# Patient Record
Sex: Female | Born: 1965 | Race: Black or African American | Hispanic: No | Marital: Single | Smoking: Former smoker
Health system: Southern US, Community
[De-identification: ages and names within clinical notes are randomized; demographics above are authoritative.]

## PROBLEM LIST (undated history)

## (undated) DIAGNOSIS — I509 Heart failure, unspecified: Secondary | ICD-10-CM

## (undated) DIAGNOSIS — F319 Bipolar disorder, unspecified: Secondary | ICD-10-CM

## (undated) DIAGNOSIS — D649 Anemia, unspecified: Secondary | ICD-10-CM

## (undated) DIAGNOSIS — H409 Unspecified glaucoma: Secondary | ICD-10-CM

## (undated) DIAGNOSIS — I10 Essential (primary) hypertension: Secondary | ICD-10-CM

## (undated) DIAGNOSIS — R011 Cardiac murmur, unspecified: Secondary | ICD-10-CM

## (undated) DIAGNOSIS — F1910 Other psychoactive substance abuse, uncomplicated: Secondary | ICD-10-CM

## (undated) DIAGNOSIS — T7840XA Allergy, unspecified, initial encounter: Secondary | ICD-10-CM

## (undated) DIAGNOSIS — K259 Gastric ulcer, unspecified as acute or chronic, without hemorrhage or perforation: Secondary | ICD-10-CM

## (undated) DIAGNOSIS — E7800 Pure hypercholesterolemia, unspecified: Secondary | ICD-10-CM

## (undated) DIAGNOSIS — F329 Major depressive disorder, single episode, unspecified: Secondary | ICD-10-CM

## (undated) DIAGNOSIS — M109 Gout, unspecified: Secondary | ICD-10-CM

## (undated) DIAGNOSIS — R51 Headache: Secondary | ICD-10-CM

## (undated) DIAGNOSIS — G4730 Sleep apnea, unspecified: Secondary | ICD-10-CM

## (undated) DIAGNOSIS — M26609 Unspecified temporomandibular joint disorder, unspecified side: Secondary | ICD-10-CM

## (undated) DIAGNOSIS — F32A Depression, unspecified: Secondary | ICD-10-CM

## (undated) DIAGNOSIS — K219 Gastro-esophageal reflux disease without esophagitis: Secondary | ICD-10-CM

## (undated) DIAGNOSIS — F419 Anxiety disorder, unspecified: Secondary | ICD-10-CM

## (undated) DIAGNOSIS — N6099 Unspecified benign mammary dysplasia of unspecified breast: Secondary | ICD-10-CM

## (undated) DIAGNOSIS — I639 Cerebral infarction, unspecified: Secondary | ICD-10-CM

## (undated) DIAGNOSIS — M1990 Unspecified osteoarthritis, unspecified site: Secondary | ICD-10-CM

## (undated) HISTORY — DX: Other psychoactive substance abuse, uncomplicated: F19.10

## (undated) HISTORY — DX: Cardiac murmur, unspecified: R01.1

## (undated) HISTORY — DX: Unspecified glaucoma: H40.9

## (undated) HISTORY — DX: Allergy, unspecified, initial encounter: T78.40XA

## (undated) HISTORY — PX: BREAST EXCISIONAL BIOPSY: SUR124

## (undated) HISTORY — DX: Anemia, unspecified: D64.9

---

## 1997-12-07 ENCOUNTER — Emergency Department (HOSPITAL_COMMUNITY): Admission: EM | Admit: 1997-12-07 | Discharge: 1997-12-07 | Payer: Self-pay

## 1999-06-14 ENCOUNTER — Other Ambulatory Visit: Admission: RE | Admit: 1999-06-14 | Discharge: 1999-06-14 | Payer: Self-pay | Admitting: Family Medicine

## 1999-07-07 ENCOUNTER — Emergency Department (HOSPITAL_COMMUNITY): Admission: EM | Admit: 1999-07-07 | Discharge: 1999-07-07 | Payer: Self-pay | Admitting: Emergency Medicine

## 1999-07-07 ENCOUNTER — Encounter: Payer: Self-pay | Admitting: Emergency Medicine

## 2000-03-10 ENCOUNTER — Emergency Department (HOSPITAL_COMMUNITY): Admission: EM | Admit: 2000-03-10 | Discharge: 2000-03-10 | Payer: Self-pay | Admitting: Emergency Medicine

## 2000-03-12 ENCOUNTER — Emergency Department (HOSPITAL_COMMUNITY): Admission: EM | Admit: 2000-03-12 | Discharge: 2000-03-12 | Payer: Self-pay | Admitting: Emergency Medicine

## 2002-02-09 ENCOUNTER — Encounter: Payer: Self-pay | Admitting: Family Medicine

## 2002-02-09 ENCOUNTER — Ambulatory Visit (HOSPITAL_COMMUNITY): Admission: RE | Admit: 2002-02-09 | Discharge: 2002-02-09 | Payer: Self-pay | Admitting: Family Medicine

## 2005-10-20 ENCOUNTER — Emergency Department (HOSPITAL_COMMUNITY): Admission: EM | Admit: 2005-10-20 | Discharge: 2005-10-21 | Payer: Self-pay | Admitting: Emergency Medicine

## 2006-08-28 ENCOUNTER — Emergency Department (HOSPITAL_COMMUNITY): Admission: EM | Admit: 2006-08-28 | Discharge: 2006-08-28 | Payer: Self-pay | Admitting: Emergency Medicine

## 2006-12-14 ENCOUNTER — Ambulatory Visit: Payer: Self-pay | Admitting: Family Medicine

## 2006-12-21 ENCOUNTER — Emergency Department (HOSPITAL_COMMUNITY): Admission: EM | Admit: 2006-12-21 | Discharge: 2006-12-21 | Payer: Self-pay | Admitting: Emergency Medicine

## 2007-01-05 ENCOUNTER — Ambulatory Visit: Payer: Self-pay | Admitting: Family Medicine

## 2007-01-05 ENCOUNTER — Encounter (INDEPENDENT_AMBULATORY_CARE_PROVIDER_SITE_OTHER): Payer: Self-pay | Admitting: Family Medicine

## 2007-01-05 DIAGNOSIS — R8789 Other abnormal findings in specimens from female genital organs: Secondary | ICD-10-CM | POA: Insufficient documentation

## 2007-01-05 LAB — CONVERTED CEMR LAB
Chlamydia, DNA Probe: NEGATIVE
GC Probe Amp, Genital: NEGATIVE
Pap Smear: ABNORMAL

## 2007-01-12 ENCOUNTER — Ambulatory Visit: Payer: Self-pay | Admitting: Family Medicine

## 2007-01-15 ENCOUNTER — Ambulatory Visit: Payer: Self-pay | Admitting: Family Medicine

## 2007-02-08 ENCOUNTER — Telehealth (INDEPENDENT_AMBULATORY_CARE_PROVIDER_SITE_OTHER): Payer: Self-pay | Admitting: *Deleted

## 2007-02-19 ENCOUNTER — Ambulatory Visit: Payer: Self-pay | Admitting: *Deleted

## 2007-02-22 ENCOUNTER — Encounter (INDEPENDENT_AMBULATORY_CARE_PROVIDER_SITE_OTHER): Payer: Self-pay | Admitting: Family Medicine

## 2007-02-22 DIAGNOSIS — D649 Anemia, unspecified: Secondary | ICD-10-CM | POA: Insufficient documentation

## 2007-02-26 DIAGNOSIS — I10 Essential (primary) hypertension: Secondary | ICD-10-CM | POA: Insufficient documentation

## 2007-03-04 ENCOUNTER — Encounter (INDEPENDENT_AMBULATORY_CARE_PROVIDER_SITE_OTHER): Payer: Self-pay | Admitting: *Deleted

## 2007-03-10 ENCOUNTER — Ambulatory Visit: Payer: Self-pay | Admitting: Family Medicine

## 2007-03-10 ENCOUNTER — Telehealth (INDEPENDENT_AMBULATORY_CARE_PROVIDER_SITE_OTHER): Payer: Self-pay | Admitting: *Deleted

## 2007-03-10 DIAGNOSIS — M545 Low back pain: Secondary | ICD-10-CM

## 2007-04-01 ENCOUNTER — Encounter (INDEPENDENT_AMBULATORY_CARE_PROVIDER_SITE_OTHER): Payer: Self-pay | Admitting: Family Medicine

## 2007-04-01 ENCOUNTER — Encounter: Admission: RE | Admit: 2007-04-01 | Discharge: 2007-06-30 | Payer: Self-pay | Admitting: Family Medicine

## 2007-08-03 ENCOUNTER — Emergency Department (HOSPITAL_COMMUNITY): Admission: EM | Admit: 2007-08-03 | Discharge: 2007-08-03 | Payer: Self-pay | Admitting: Emergency Medicine

## 2007-08-12 ENCOUNTER — Encounter (INDEPENDENT_AMBULATORY_CARE_PROVIDER_SITE_OTHER): Payer: Self-pay | Admitting: Family Medicine

## 2007-08-26 ENCOUNTER — Encounter (INDEPENDENT_AMBULATORY_CARE_PROVIDER_SITE_OTHER): Payer: Self-pay | Admitting: Family Medicine

## 2007-09-10 ENCOUNTER — Encounter (INDEPENDENT_AMBULATORY_CARE_PROVIDER_SITE_OTHER): Payer: Self-pay | Admitting: Family Medicine

## 2007-09-29 ENCOUNTER — Telehealth (INDEPENDENT_AMBULATORY_CARE_PROVIDER_SITE_OTHER): Payer: Self-pay | Admitting: *Deleted

## 2007-10-11 ENCOUNTER — Ambulatory Visit: Payer: Self-pay | Admitting: Family Medicine

## 2007-10-11 DIAGNOSIS — K219 Gastro-esophageal reflux disease without esophagitis: Secondary | ICD-10-CM | POA: Insufficient documentation

## 2007-10-18 ENCOUNTER — Encounter (INDEPENDENT_AMBULATORY_CARE_PROVIDER_SITE_OTHER): Payer: Self-pay | Admitting: Family Medicine

## 2007-12-30 ENCOUNTER — Emergency Department (HOSPITAL_COMMUNITY): Admission: EM | Admit: 2007-12-30 | Discharge: 2007-12-30 | Payer: Self-pay | Admitting: Emergency Medicine

## 2008-02-29 ENCOUNTER — Ambulatory Visit: Payer: Self-pay | Admitting: Family Medicine

## 2008-02-29 DIAGNOSIS — K5900 Constipation, unspecified: Secondary | ICD-10-CM | POA: Insufficient documentation

## 2008-04-04 ENCOUNTER — Encounter (INDEPENDENT_AMBULATORY_CARE_PROVIDER_SITE_OTHER): Payer: Self-pay | Admitting: *Deleted

## 2008-05-26 ENCOUNTER — Emergency Department (HOSPITAL_COMMUNITY): Admission: EM | Admit: 2008-05-26 | Discharge: 2008-05-26 | Payer: Self-pay | Admitting: Emergency Medicine

## 2008-05-28 ENCOUNTER — Emergency Department (HOSPITAL_COMMUNITY): Admission: EM | Admit: 2008-05-28 | Discharge: 2008-05-28 | Payer: Self-pay | Admitting: Emergency Medicine

## 2008-06-14 ENCOUNTER — Telehealth (INDEPENDENT_AMBULATORY_CARE_PROVIDER_SITE_OTHER): Payer: Self-pay | Admitting: *Deleted

## 2008-06-15 ENCOUNTER — Ambulatory Visit: Payer: Self-pay | Admitting: Nurse Practitioner

## 2008-06-15 DIAGNOSIS — H6690 Otitis media, unspecified, unspecified ear: Secondary | ICD-10-CM | POA: Insufficient documentation

## 2008-06-15 LAB — CONVERTED CEMR LAB
Alkaline Phosphatase: 54 units/L (ref 39–117)
Basophils Absolute: 0 10*3/uL (ref 0.0–0.1)
Eosinophils Absolute: 0.1 10*3/uL (ref 0.0–0.7)
Eosinophils Relative: 2 % (ref 0–5)
Glucose, Bld: 98 mg/dL (ref 70–99)
HCT: 41.3 % (ref 36.0–46.0)
Lymphs Abs: 1.7 10*3/uL (ref 0.7–4.0)
MCHC: 31 g/dL (ref 30.0–36.0)
MCV: 93.7 fL (ref 78.0–100.0)
Platelets: 293 10*3/uL (ref 150–400)
RDW: 12.7 % (ref 11.5–15.5)
Sodium: 140 meq/L (ref 135–145)
Total Bilirubin: 0.3 mg/dL (ref 0.3–1.2)
Total Protein: 8.3 g/dL (ref 6.0–8.3)

## 2008-06-19 ENCOUNTER — Encounter (INDEPENDENT_AMBULATORY_CARE_PROVIDER_SITE_OTHER): Payer: Self-pay | Admitting: Nurse Practitioner

## 2008-07-11 ENCOUNTER — Ambulatory Visit: Payer: Self-pay | Admitting: Family Medicine

## 2008-07-11 ENCOUNTER — Encounter (INDEPENDENT_AMBULATORY_CARE_PROVIDER_SITE_OTHER): Payer: Self-pay | Admitting: Family Medicine

## 2008-07-11 DIAGNOSIS — R1909 Other intra-abdominal and pelvic swelling, mass and lump: Secondary | ICD-10-CM | POA: Insufficient documentation

## 2008-07-11 LAB — CONVERTED CEMR LAB
Blood in Urine, dipstick: NEGATIVE
Chlamydia, DNA Probe: NEGATIVE
GC Probe Amp, Genital: NEGATIVE
Glucose, Urine, Semiquant: NEGATIVE
WBC Urine, dipstick: NEGATIVE

## 2008-07-12 ENCOUNTER — Ambulatory Visit (HOSPITAL_COMMUNITY): Admission: RE | Admit: 2008-07-12 | Discharge: 2008-07-12 | Payer: Self-pay | Admitting: Family Medicine

## 2008-07-17 ENCOUNTER — Ambulatory Visit (HOSPITAL_COMMUNITY): Admission: RE | Admit: 2008-07-17 | Discharge: 2008-07-17 | Payer: Self-pay | Admitting: Family Medicine

## 2008-07-18 ENCOUNTER — Encounter (INDEPENDENT_AMBULATORY_CARE_PROVIDER_SITE_OTHER): Payer: Self-pay | Admitting: Family Medicine

## 2008-07-20 ENCOUNTER — Emergency Department (HOSPITAL_COMMUNITY): Admission: EM | Admit: 2008-07-20 | Discharge: 2008-07-20 | Payer: Self-pay | Admitting: Emergency Medicine

## 2008-07-26 ENCOUNTER — Telehealth (INDEPENDENT_AMBULATORY_CARE_PROVIDER_SITE_OTHER): Payer: Self-pay | Admitting: Family Medicine

## 2008-08-10 ENCOUNTER — Telehealth (INDEPENDENT_AMBULATORY_CARE_PROVIDER_SITE_OTHER): Payer: Self-pay | Admitting: *Deleted

## 2008-08-21 ENCOUNTER — Ambulatory Visit: Payer: Self-pay | Admitting: Family Medicine

## 2008-08-21 LAB — CONVERTED CEMR LAB: TSH: 0.716 microintl units/mL (ref 0.350–4.500)

## 2008-09-19 ENCOUNTER — Telehealth (INDEPENDENT_AMBULATORY_CARE_PROVIDER_SITE_OTHER): Payer: Self-pay | Admitting: Family Medicine

## 2008-09-26 ENCOUNTER — Emergency Department (HOSPITAL_COMMUNITY): Admission: EM | Admit: 2008-09-26 | Discharge: 2008-09-26 | Payer: Self-pay | Admitting: Emergency Medicine

## 2008-10-27 ENCOUNTER — Emergency Department (HOSPITAL_COMMUNITY): Admission: EM | Admit: 2008-10-27 | Discharge: 2008-10-27 | Payer: Self-pay | Admitting: Emergency Medicine

## 2008-10-30 ENCOUNTER — Inpatient Hospital Stay (HOSPITAL_COMMUNITY): Admission: EM | Admit: 2008-10-30 | Discharge: 2008-10-31 | Payer: Self-pay | Admitting: Emergency Medicine

## 2008-10-31 ENCOUNTER — Encounter (INDEPENDENT_AMBULATORY_CARE_PROVIDER_SITE_OTHER): Payer: Self-pay | Admitting: Family Medicine

## 2008-11-10 ENCOUNTER — Ambulatory Visit: Payer: Self-pay | Admitting: Family Medicine

## 2008-11-22 ENCOUNTER — Ambulatory Visit: Payer: Self-pay | Admitting: Family Medicine

## 2008-11-22 DIAGNOSIS — H9209 Otalgia, unspecified ear: Secondary | ICD-10-CM | POA: Insufficient documentation

## 2008-11-22 DIAGNOSIS — R42 Dizziness and giddiness: Secondary | ICD-10-CM

## 2008-11-22 DIAGNOSIS — M25469 Effusion, unspecified knee: Secondary | ICD-10-CM | POA: Insufficient documentation

## 2008-11-23 ENCOUNTER — Encounter (INDEPENDENT_AMBULATORY_CARE_PROVIDER_SITE_OTHER): Payer: Self-pay | Admitting: Family Medicine

## 2008-11-23 ENCOUNTER — Ambulatory Visit: Payer: Self-pay | Admitting: Internal Medicine

## 2008-11-27 ENCOUNTER — Encounter (INDEPENDENT_AMBULATORY_CARE_PROVIDER_SITE_OTHER): Payer: Self-pay | Admitting: Family Medicine

## 2008-12-16 ENCOUNTER — Emergency Department (HOSPITAL_COMMUNITY): Admission: EM | Admit: 2008-12-16 | Discharge: 2008-12-16 | Payer: Self-pay | Admitting: Emergency Medicine

## 2008-12-20 ENCOUNTER — Encounter (INDEPENDENT_AMBULATORY_CARE_PROVIDER_SITE_OTHER): Payer: Self-pay | Admitting: Internal Medicine

## 2008-12-28 ENCOUNTER — Ambulatory Visit: Payer: Self-pay | Admitting: Nurse Practitioner

## 2009-01-19 ENCOUNTER — Ambulatory Visit: Payer: Self-pay | Admitting: Nurse Practitioner

## 2009-01-19 ENCOUNTER — Encounter: Payer: Self-pay | Admitting: Physician Assistant

## 2009-01-19 ENCOUNTER — Encounter (INDEPENDENT_AMBULATORY_CARE_PROVIDER_SITE_OTHER): Payer: Self-pay | Admitting: Nurse Practitioner

## 2009-01-19 LAB — CONVERTED CEMR LAB
Nitrite: NEGATIVE
Specific Gravity, Urine: 1.02

## 2009-01-26 DIAGNOSIS — A5901 Trichomonal vulvovaginitis: Secondary | ICD-10-CM | POA: Insufficient documentation

## 2009-01-31 ENCOUNTER — Ambulatory Visit (HOSPITAL_COMMUNITY): Admission: RE | Admit: 2009-01-31 | Discharge: 2009-01-31 | Payer: Self-pay | Admitting: Gastroenterology

## 2009-01-31 ENCOUNTER — Encounter: Payer: Self-pay | Admitting: Physician Assistant

## 2009-02-26 ENCOUNTER — Ambulatory Visit: Payer: Self-pay | Admitting: Physician Assistant

## 2009-02-26 ENCOUNTER — Telehealth: Payer: Self-pay | Admitting: Physician Assistant

## 2009-02-26 DIAGNOSIS — R82998 Other abnormal findings in urine: Secondary | ICD-10-CM

## 2009-02-26 DIAGNOSIS — R079 Chest pain, unspecified: Secondary | ICD-10-CM

## 2009-02-26 LAB — CONVERTED CEMR LAB
Blood in Urine, dipstick: NEGATIVE
Nitrite: NEGATIVE
Protein, U semiquant: 30

## 2009-03-07 ENCOUNTER — Ambulatory Visit (HOSPITAL_COMMUNITY): Admission: RE | Admit: 2009-03-07 | Discharge: 2009-03-07 | Payer: Self-pay | Admitting: Physician Assistant

## 2009-03-12 ENCOUNTER — Encounter: Payer: Self-pay | Admitting: Physician Assistant

## 2009-03-14 ENCOUNTER — Telehealth: Payer: Self-pay | Admitting: Physician Assistant

## 2009-03-15 ENCOUNTER — Ambulatory Visit (HOSPITAL_COMMUNITY): Admission: RE | Admit: 2009-03-15 | Discharge: 2009-03-15 | Payer: Self-pay | Admitting: Internal Medicine

## 2009-03-16 ENCOUNTER — Encounter: Payer: Self-pay | Admitting: Physician Assistant

## 2009-03-28 ENCOUNTER — Ambulatory Visit: Payer: Self-pay | Admitting: Physician Assistant

## 2009-03-28 DIAGNOSIS — R0602 Shortness of breath: Secondary | ICD-10-CM | POA: Insufficient documentation

## 2009-03-29 ENCOUNTER — Encounter: Payer: Self-pay | Admitting: Physician Assistant

## 2009-04-02 DIAGNOSIS — A048 Other specified bacterial intestinal infections: Secondary | ICD-10-CM | POA: Insufficient documentation

## 2009-04-02 LAB — CONVERTED CEMR LAB
Albumin: 4.8 g/dL (ref 3.5–5.2)
Alkaline Phosphatase: 67 units/L (ref 39–117)
BUN: 12 mg/dL (ref 6–23)
Basophils Absolute: 0 10*3/uL (ref 0.0–0.1)
Basophils Relative: 0 % (ref 0–1)
CO2: 19 meq/L (ref 19–32)
Eosinophils Relative: 1 % (ref 0–5)
Glucose, Bld: 91 mg/dL (ref 70–99)
HCT: 38.4 % (ref 36.0–46.0)
Helicobacter pylori, IgM: 1.65 — ABNORMAL HIGH (ref <0.89)
Hemoglobin: 12.4 g/dL (ref 12.0–15.0)
Lymphocytes Relative: 24 % (ref 12–46)
MCHC: 32.3 g/dL (ref 30.0–36.0)
Monocytes Absolute: 0.3 10*3/uL (ref 0.1–1.0)
Monocytes Relative: 6 % (ref 3–12)
Potassium: 3.5 meq/L (ref 3.5–5.3)
RBC: 4.16 M/uL (ref 3.87–5.11)
RDW: 13.4 % (ref 11.5–15.5)
Total Protein: 8 g/dL (ref 6.0–8.3)

## 2009-04-04 ENCOUNTER — Encounter: Payer: Self-pay | Admitting: Physician Assistant

## 2009-04-04 ENCOUNTER — Encounter: Admission: RE | Admit: 2009-04-04 | Discharge: 2009-05-09 | Payer: Self-pay | Admitting: Physician Assistant

## 2009-04-05 ENCOUNTER — Telehealth: Payer: Self-pay | Admitting: Physician Assistant

## 2009-04-11 ENCOUNTER — Encounter: Payer: Self-pay | Admitting: Physician Assistant

## 2009-05-01 ENCOUNTER — Ambulatory Visit: Payer: Self-pay | Admitting: Physician Assistant

## 2009-05-01 DIAGNOSIS — H9209 Otalgia, unspecified ear: Secondary | ICD-10-CM | POA: Insufficient documentation

## 2009-05-08 ENCOUNTER — Ambulatory Visit (HOSPITAL_COMMUNITY): Admission: RE | Admit: 2009-05-08 | Discharge: 2009-05-08 | Payer: Self-pay | Admitting: Physician Assistant

## 2009-05-08 ENCOUNTER — Encounter: Payer: Self-pay | Admitting: Physician Assistant

## 2009-05-17 ENCOUNTER — Telehealth: Payer: Self-pay | Admitting: Physician Assistant

## 2009-05-18 ENCOUNTER — Encounter: Payer: Self-pay | Admitting: Physician Assistant

## 2009-06-01 ENCOUNTER — Ambulatory Visit (HOSPITAL_COMMUNITY): Admission: RE | Admit: 2009-06-01 | Discharge: 2009-06-01 | Payer: Self-pay | Admitting: Internal Medicine

## 2009-07-06 ENCOUNTER — Emergency Department (HOSPITAL_COMMUNITY): Admission: EM | Admit: 2009-07-06 | Discharge: 2009-07-06 | Payer: Self-pay | Admitting: Emergency Medicine

## 2009-07-10 ENCOUNTER — Ambulatory Visit: Payer: Self-pay | Admitting: Physician Assistant

## 2009-07-11 LAB — CONVERTED CEMR LAB
ALT: 17 units/L (ref 0–35)
AST: 23 units/L (ref 0–37)
Albumin: 4.5 g/dL (ref 3.5–5.2)
Alkaline Phosphatase: 65 units/L (ref 39–117)
Glucose, Bld: 104 mg/dL — ABNORMAL HIGH (ref 70–99)
Potassium: 4.4 meq/L (ref 3.5–5.3)
Sodium: 141 meq/L (ref 135–145)
Total Bilirubin: 0.4 mg/dL (ref 0.3–1.2)
Total Protein: 7.5 g/dL (ref 6.0–8.3)

## 2009-07-12 ENCOUNTER — Encounter: Admission: RE | Admit: 2009-07-12 | Discharge: 2009-07-12 | Payer: Self-pay | Admitting: Internal Medicine

## 2009-07-12 ENCOUNTER — Telehealth: Payer: Self-pay | Admitting: Physician Assistant

## 2009-07-13 ENCOUNTER — Ambulatory Visit (HOSPITAL_COMMUNITY): Admission: RE | Admit: 2009-07-13 | Discharge: 2009-07-13 | Payer: Self-pay | Admitting: Internal Medicine

## 2009-07-25 ENCOUNTER — Encounter: Admission: RE | Admit: 2009-07-25 | Discharge: 2009-07-25 | Payer: Self-pay | Admitting: Internal Medicine

## 2009-07-26 ENCOUNTER — Telehealth: Payer: Self-pay | Admitting: Physician Assistant

## 2009-08-08 ENCOUNTER — Encounter: Admission: RE | Admit: 2009-08-08 | Discharge: 2009-08-08 | Payer: Self-pay | Admitting: Internal Medicine

## 2009-08-09 ENCOUNTER — Telehealth: Payer: Self-pay | Admitting: Physician Assistant

## 2009-08-14 ENCOUNTER — Encounter: Payer: Self-pay | Admitting: Physician Assistant

## 2009-08-15 ENCOUNTER — Telehealth: Payer: Self-pay | Admitting: Physician Assistant

## 2009-08-19 ENCOUNTER — Encounter: Payer: Self-pay | Admitting: Physician Assistant

## 2009-08-20 ENCOUNTER — Ambulatory Visit: Payer: Self-pay | Admitting: Physician Assistant

## 2009-08-20 DIAGNOSIS — B029 Zoster without complications: Secondary | ICD-10-CM | POA: Insufficient documentation

## 2009-08-22 ENCOUNTER — Ambulatory Visit (HOSPITAL_COMMUNITY): Admission: RE | Admit: 2009-08-22 | Discharge: 2009-08-22 | Payer: Self-pay | Admitting: Gastroenterology

## 2009-08-24 ENCOUNTER — Telehealth: Payer: Self-pay | Admitting: Physician Assistant

## 2009-09-17 ENCOUNTER — Emergency Department (HOSPITAL_COMMUNITY): Admission: EM | Admit: 2009-09-17 | Discharge: 2009-09-17 | Payer: Self-pay | Admitting: Emergency Medicine

## 2009-09-25 ENCOUNTER — Encounter: Payer: Self-pay | Admitting: Physician Assistant

## 2009-10-25 ENCOUNTER — Telehealth: Payer: Self-pay | Admitting: Physician Assistant

## 2009-10-29 ENCOUNTER — Ambulatory Visit: Payer: Self-pay | Admitting: Physician Assistant

## 2009-10-29 DIAGNOSIS — N912 Amenorrhea, unspecified: Secondary | ICD-10-CM

## 2009-10-30 ENCOUNTER — Ambulatory Visit: Payer: Self-pay | Admitting: Physician Assistant

## 2009-10-30 LAB — CONVERTED CEMR LAB
BUN: 13 mg/dL (ref 6–23)
Barbiturate Quant, Ur: NEGATIVE
Calcium: 9.2 mg/dL (ref 8.4–10.5)
Creatinine, Ser: 0.79 mg/dL (ref 0.40–1.20)
Creatinine,U: 137 mg/dL
Glucose, Bld: 93 mg/dL (ref 70–99)
Marijuana Metabolite: NEGATIVE
Opiate Screen, Urine: NEGATIVE
Phencyclidine (PCP): NEGATIVE
Potassium: 4.3 meq/L (ref 3.5–5.3)
Prolactin: 9.2 ng/mL
Propoxyphene: NEGATIVE
TSH: 1.196 microintl units/mL (ref 0.350–4.500)

## 2009-10-31 ENCOUNTER — Encounter: Payer: Self-pay | Admitting: Physician Assistant

## 2009-11-13 ENCOUNTER — Telehealth: Payer: Self-pay | Admitting: Physician Assistant

## 2009-12-18 ENCOUNTER — Ambulatory Visit: Payer: Self-pay | Admitting: Physician Assistant

## 2009-12-18 ENCOUNTER — Telehealth: Payer: Self-pay | Admitting: Physician Assistant

## 2009-12-18 DIAGNOSIS — M25569 Pain in unspecified knee: Secondary | ICD-10-CM

## 2009-12-20 ENCOUNTER — Ambulatory Visit (HOSPITAL_COMMUNITY): Admission: RE | Admit: 2009-12-20 | Discharge: 2009-12-20 | Payer: Self-pay | Admitting: Physician Assistant

## 2009-12-24 ENCOUNTER — Telehealth: Payer: Self-pay | Admitting: Physician Assistant

## 2010-01-10 ENCOUNTER — Telehealth: Payer: Self-pay | Admitting: Physician Assistant

## 2010-01-23 ENCOUNTER — Emergency Department (HOSPITAL_COMMUNITY): Admission: EM | Admit: 2010-01-23 | Discharge: 2010-01-23 | Payer: Self-pay | Admitting: Emergency Medicine

## 2010-02-14 ENCOUNTER — Telehealth: Payer: Self-pay | Admitting: Physician Assistant

## 2010-02-15 ENCOUNTER — Encounter: Payer: Self-pay | Admitting: Physician Assistant

## 2010-03-01 ENCOUNTER — Ambulatory Visit: Payer: Self-pay | Admitting: Physician Assistant

## 2010-03-04 LAB — CONVERTED CEMR LAB
BUN: 13 mg/dL (ref 6–23)
CO2: 25 meq/L (ref 19–32)
Calcium: 9.8 mg/dL (ref 8.4–10.5)
Glucose, Bld: 94 mg/dL (ref 70–99)
Sodium: 138 meq/L (ref 135–145)
Uric Acid, Serum: 8.5 mg/dL — ABNORMAL HIGH (ref 2.4–7.0)

## 2010-03-05 ENCOUNTER — Encounter: Payer: Self-pay | Admitting: Physician Assistant

## 2010-03-06 ENCOUNTER — Ambulatory Visit (HOSPITAL_COMMUNITY): Admission: RE | Admit: 2010-03-06 | Discharge: 2010-03-06 | Payer: Self-pay | Admitting: Internal Medicine

## 2010-03-08 ENCOUNTER — Telehealth: Payer: Self-pay | Admitting: Physician Assistant

## 2010-03-14 DIAGNOSIS — IMO0002 Reserved for concepts with insufficient information to code with codable children: Secondary | ICD-10-CM | POA: Insufficient documentation

## 2010-03-15 ENCOUNTER — Telehealth: Payer: Self-pay | Admitting: Physician Assistant

## 2010-03-15 ENCOUNTER — Encounter: Payer: Self-pay | Admitting: Physician Assistant

## 2010-03-16 ENCOUNTER — Encounter: Payer: Self-pay | Admitting: Physician Assistant

## 2010-03-18 ENCOUNTER — Ambulatory Visit: Payer: Self-pay | Admitting: Physician Assistant

## 2010-03-18 LAB — CONVERTED CEMR LAB
Glucose, Urine, Semiquant: NEGATIVE
KOH Prep: NEGATIVE
Nitrite: NEGATIVE
WBC Urine, dipstick: NEGATIVE
Whiff Test: NEGATIVE
pH: 5.5

## 2010-03-19 LAB — CONVERTED CEMR LAB
ALT: 32 units/L (ref 0–35)
AST: 28 units/L (ref 0–37)
Albumin: 4.3 g/dL (ref 3.5–5.2)
Alkaline Phosphatase: 57 units/L (ref 39–117)
BUN: 12 mg/dL (ref 6–23)
CO2: 26 meq/L (ref 19–32)
Calcium: 9.5 mg/dL (ref 8.4–10.5)
Chloride: 103 meq/L (ref 96–112)
Creatinine, Ser: 0.66 mg/dL (ref 0.40–1.20)
Free T4: 1.05 ng/dL (ref 0.80–1.80)
Glucose, Bld: 99 mg/dL (ref 70–99)
Lipase: 33 units/L (ref 0–75)
Potassium: 4.6 meq/L (ref 3.5–5.3)
Sodium: 140 meq/L (ref 135–145)
TSH: 6.159 microintl units/mL — ABNORMAL HIGH (ref 0.350–4.500)
Total Bilirubin: 0.4 mg/dL (ref 0.3–1.2)
Total CK: 578 units/L — ABNORMAL HIGH (ref 7–177)
Total Protein: 7.6 g/dL (ref 6.0–8.3)

## 2010-03-22 ENCOUNTER — Encounter: Payer: Self-pay | Admitting: Physician Assistant

## 2010-03-26 ENCOUNTER — Telehealth: Payer: Self-pay | Admitting: Physician Assistant

## 2010-04-08 ENCOUNTER — Encounter (INDEPENDENT_AMBULATORY_CARE_PROVIDER_SITE_OTHER): Payer: Self-pay | Admitting: Nurse Practitioner

## 2010-04-08 ENCOUNTER — Ambulatory Visit: Payer: Self-pay | Admitting: Physician Assistant

## 2010-04-08 LAB — CONVERTED CEMR LAB
Nitrite: NEGATIVE
Protein, U semiquant: NEGATIVE
Specific Gravity, Urine: 1.01
Urobilinogen, UA: 0.2
pH: 5.5

## 2010-04-10 ENCOUNTER — Encounter (INDEPENDENT_AMBULATORY_CARE_PROVIDER_SITE_OTHER): Payer: Self-pay | Admitting: Nurse Practitioner

## 2010-04-10 LAB — CONVERTED CEMR LAB

## 2010-04-12 ENCOUNTER — Encounter: Payer: Self-pay | Admitting: Physician Assistant

## 2010-04-18 ENCOUNTER — Encounter: Payer: Self-pay | Admitting: Physician Assistant

## 2010-04-29 ENCOUNTER — Telehealth (INDEPENDENT_AMBULATORY_CARE_PROVIDER_SITE_OTHER): Payer: Self-pay | Admitting: Nurse Practitioner

## 2010-05-01 ENCOUNTER — Ambulatory Visit (HOSPITAL_COMMUNITY): Admission: RE | Admit: 2010-05-01 | Discharge: 2010-05-01 | Payer: Self-pay | Admitting: Orthopedic Surgery

## 2010-05-01 ENCOUNTER — Encounter: Payer: Self-pay | Admitting: Physician Assistant

## 2010-05-01 HISTORY — PX: KNEE ARTHROSCOPY W/ PARTIAL MEDIAL MENISCECTOMY: SHX1882

## 2010-05-13 ENCOUNTER — Encounter: Payer: Self-pay | Admitting: Physician Assistant

## 2010-05-17 ENCOUNTER — Encounter: Payer: Self-pay | Admitting: Physician Assistant

## 2010-05-17 ENCOUNTER — Encounter (INDEPENDENT_AMBULATORY_CARE_PROVIDER_SITE_OTHER): Payer: Self-pay | Admitting: Internal Medicine

## 2010-05-24 ENCOUNTER — Encounter (INDEPENDENT_AMBULATORY_CARE_PROVIDER_SITE_OTHER): Payer: Self-pay | Admitting: Orthopedic Surgery

## 2010-05-24 ENCOUNTER — Ambulatory Visit (HOSPITAL_COMMUNITY)
Admission: RE | Admit: 2010-05-24 | Discharge: 2010-05-24 | Payer: Self-pay | Source: Home / Self Care | Attending: Orthopedic Surgery | Admitting: Orthopedic Surgery

## 2010-05-29 ENCOUNTER — Encounter (INDEPENDENT_AMBULATORY_CARE_PROVIDER_SITE_OTHER): Payer: Self-pay | Admitting: Nurse Practitioner

## 2010-05-29 ENCOUNTER — Telehealth (INDEPENDENT_AMBULATORY_CARE_PROVIDER_SITE_OTHER): Payer: Self-pay | Admitting: Nurse Practitioner

## 2010-06-20 ENCOUNTER — Telehealth (INDEPENDENT_AMBULATORY_CARE_PROVIDER_SITE_OTHER): Payer: Self-pay | Admitting: Internal Medicine

## 2010-06-21 ENCOUNTER — Ambulatory Visit
Admission: RE | Admit: 2010-06-21 | Discharge: 2010-06-21 | Payer: Self-pay | Source: Home / Self Care | Attending: Internal Medicine | Admitting: Internal Medicine

## 2010-06-21 ENCOUNTER — Encounter (INDEPENDENT_AMBULATORY_CARE_PROVIDER_SITE_OTHER): Payer: Self-pay | Admitting: Internal Medicine

## 2010-06-21 DIAGNOSIS — M279 Disease of jaws, unspecified: Secondary | ICD-10-CM | POA: Insufficient documentation

## 2010-06-21 DIAGNOSIS — E785 Hyperlipidemia, unspecified: Secondary | ICD-10-CM | POA: Insufficient documentation

## 2010-06-24 ENCOUNTER — Encounter (INDEPENDENT_AMBULATORY_CARE_PROVIDER_SITE_OTHER): Payer: Self-pay | Admitting: Nurse Practitioner

## 2010-07-03 ENCOUNTER — Telehealth (INDEPENDENT_AMBULATORY_CARE_PROVIDER_SITE_OTHER): Payer: Self-pay | Admitting: Internal Medicine

## 2010-07-03 ENCOUNTER — Ambulatory Visit
Admission: RE | Admit: 2010-07-03 | Discharge: 2010-07-03 | Payer: Self-pay | Source: Home / Self Care | Attending: Internal Medicine | Admitting: Internal Medicine

## 2010-07-03 ENCOUNTER — Ambulatory Visit (HOSPITAL_COMMUNITY)
Admission: RE | Admit: 2010-07-03 | Discharge: 2010-07-03 | Payer: Self-pay | Source: Home / Self Care | Attending: Internal Medicine | Admitting: Internal Medicine

## 2010-07-05 ENCOUNTER — Ambulatory Visit: Admit: 2010-07-05 | Payer: Self-pay | Admitting: Internal Medicine

## 2010-07-07 ENCOUNTER — Encounter: Payer: Self-pay | Admitting: Internal Medicine

## 2010-07-07 ENCOUNTER — Encounter: Payer: Self-pay | Admitting: Occupational Therapy

## 2010-07-08 ENCOUNTER — Encounter: Admission: RE | Admit: 2010-07-08 | Payer: Self-pay | Source: Home / Self Care | Admitting: Internal Medicine

## 2010-07-10 ENCOUNTER — Telehealth (INDEPENDENT_AMBULATORY_CARE_PROVIDER_SITE_OTHER): Payer: Self-pay | Admitting: Internal Medicine

## 2010-07-15 ENCOUNTER — Encounter
Admission: RE | Admit: 2010-07-15 | Discharge: 2010-07-16 | Payer: Self-pay | Source: Home / Self Care | Attending: Internal Medicine | Admitting: Internal Medicine

## 2010-07-16 NOTE — Progress Notes (Signed)
Summary: Will need to have meds co-signed after surgery  Phone Note Other Incoming   Summary of Call: DR.CAFFREY'S OFFICE CALLED NEEDS TO TALK TO SOMEONE ABOUT Maureen Swanson SHE IS TO HAVE SURGERY WED AND SHE SAID THAT [facility] PAID FOR HER POST OP COAGULATION .DR CAFFREY NEEDS TO TALK TO SOMEONE TODAY PERONELLY [phone] Initial call taken by: Arta Bruce,  April 29, 2010 10:15 AM  Follow-up for Phone Call        Spoke with Dr. Christian Mate -- will write a script for Lovenox for about 5 days and three weeks of Coumadin and wants them filled at [facility].  It will need to be co-signed to be filled.  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 Follow-up by: Dutch Quint RN,  April 29, 2010 10:49 AM  Additional Follow-up for Phone Call Additional follow up Details #1::        You will need to call the pharmacy to clarify regarding the lovenox.  I'm not sure if they stock this ... it may have to be ordered IF they can even get it. coumadin should not be a problem Additional Follow-up by: Lehman Prom FNP,  April 29, 2010 2:24 PM    Additional Follow-up for Phone Call Additional follow up Details #2::    [facility] does not stock lovenox.  Dutch Quint RN  April 29, 2010 3:05 PM  You wiill need to communicate this to the office that you originally spoke with as it appears that they have written her an Rx for discharge.  Get instructions on what you need to communicate to the pt when you speak with her n.martin,fnp  April 29, 2010 April 29, 2010  Called office -- no one is answering phone.  Message repeating.  Dutch Quint RN  May 01, 2010 10:13 AM  Spoke with pt.-- had knee surgery yesterday and has not filled either of her Rx.  Advised that Lovenox will have to be filled at outside pharmacy, coumadin can be filled at [facility] or at [facility] ($4.00).  I stressed toher the importance of getting the Coumadin started as soon as she could pick it up and  that I would contact her doctor for other instructions. States that she has transportation also has no money.  Call placed to Dr. Valentina Gu office.  Called [facility] -- would have to be ordered through [facility] and wouldn't be available in timely manner for pt. use.   Dutch Quint RN  May 02, 2010 10:58 AM  Spoke with Dr. Christian Mate -- stated that he feels that Coumadin should be sufficient, but to stress to pt. the need for her to obtain and start taking Coumadin.  Called pt. -- at [facility] to pick up coumadin.  Dutch Quint RN  May 02, 2010 11:37 AM    Additional Follow-up for Phone Call Additional follow up Details #3:: Details for Additional Follow-up Action Taken: Pt. states that she is taking Coumadin -- given instructions re anticoagulation therapy, use of soft toothbrush,  comb and brush, to use electric razor for shaving, limiting amounts of dark, green leafy vegetables, to use stool softeners to avoid straining and to watch for blood in stools and urine.  Verbalized understanding.  Dutch Quint RN  May 03, 2010 4:01 PM

## 2010-07-16 NOTE — Letter (Signed)
Summary: *Referral Letter  [facility]  [address] [place], [place] [address]   Phone: [phone]  Fax: [phone]    07/10/2009  Thank you in advance for agreeing to see my patient:  Maureen Swanson [address] [place], [place]  [address] Phone: [phone]  Reason for Referral: 45 yo female with schizophrenia with GERD and complaints of dysphagia.  She completed treatment for H. pylori.  She is on two times a day  PPI.  She has had an upper GI that was unremarkable.  However, she continues to complain of dysphagia that is getting worse.  She describes episodes of forcing emesis due to the dysphagia.  Please evaluate.  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) 4)  DYSPNEA (ICD-786.05) 5)  SCHIZOPHRENIA (ICD-295.90) 6)  URINALYSIS, ABNORMAL (ICD-791.9) 7)  CHEST PAIN UNSPECIFIED (ICD-786.50) 8)  FAMILY HISTORY OF CAD FEMALE 1ST DEGREE RELATIVE <60 (ICD-V16.49) 9)  VAGINAL TRICHOMONIASIS (ICD-131.01) 10)  DIZZINESS (ICD-780.4) 11)  JOINT EFFUSION, LEFT KNEE (ICD-719.06) 12)  OTOGENIC PAIN (ICD-388.71) 13)  INSOMNIA, CHRONIC (ICD-307.42) 14)  DEPRESSION (ICD-311) 15)  ADNEXAL MASS, LEFT (ICD-789.39) 16)  EXAMINATION, ROUTINE MEDICAL (ICD-V70.0) 17)  UNSPECIFIED BREAST SCREENING (ICD-V76.10) 18)  OTITIS MEDIA, LEFT (ICD-382.9) 19)  CONSTIPATION (ICD-564.00) 20)  GERD (ICD-530.81) 21)  BACK PAIN, LUMBAR (ICD-724.2) 22)  HYPERTENSION (ICD-401.9) 23)  ABUSE, ALCOHOL, 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) 24)  ABUSE, COCAINE, 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) 25)  PAP SMEAR, LGSIL, ABNORMAL (ICD-795.09) 26)  ANEMIA NOS (ICD-285.9)  Current Medications: 1)  POTASSIUM CHLORIDE CRYS CR 20 MEQ CR-TABS (POTASSIUM CHLORIDE CRYS CR) One tablet by mouth daily for potassium 2)  PROTONIX 40 MG TBEC (PANTOPRAZOLE SODIUM) Take 1 tablet by mouth two times a day 3)  ROBAXIN 500 MG TABS  (METHOCARBAMOL) 1-2 tabs every 6-8 hours as needed muscle spasm 4)  ZOLOFT 100 MG TABS (SERTRALINE HCL) 1 1/2 by mouth at bedtime 5)  MAXZIDE-25 37.5-25 MG TABS (TRIAMTERENE-HCTZ) One tablet by mouth daily for blood pressure 6)  TRAZODONE HCL 50 MG TABS (TRAZODONE HCL) 3 tablets by mouth at bedtime as needed 7)  BENZTROPINE MESYLATE 1 MG TABS (BENZTROPINE MESYLATE) 1 by mouth at bedtime 8)  THIOTHIXENE 10 MG CAPS (THIOTHIXENE) 1 by mouth at bedtime 9)  ANTIVERT 12.5 MG TABS (MECLIZINE HCL) one pill every 6 hours for dizziness 10)  NAPROXEN 500 MG TABS (NAPROXEN) 1 tablet by mouth every 12 hours as needed for pain 11)  VENTOLIN HFA 108 (90 BASE) MCG/ACT AERS (ALBUTEROL SULFATE) 2 inhalations every 6 hours for shortness of breath 12)  METRONIDAZOLE 500 MG TABS (METRONIDAZOLE) 4 tablets by mouth x 1 dose 13)  ADVAIR DISKUS 500-50 MCG/DOSE AEPB (FLUTICASONE-SALMETEROL) Take 1 puff two times a day 14)  HELIDAC  MISC (METRONID-TETRACYC-BIS SUBSAL) 1 dose by mouth 4 times a day for 2 weeks 15)  CORTISPORIN 3.5-10000-1 SOLN (NEOMYCIN-POLYMYXIN-HC) 4 gtts in left ear three times a day for 7 days 16)  REVIA 50 MG TABS (NALTREXONE HCL) Take 1 tablet by mouth once a day (per [facility])  Past Medical History: 1)  Current Problems:  2)  DYSLIPIDEMIA (ICD-272.4) 3)  h/o ANEMIA NOS (ICD-285.9) 4)  Hypertension 5)  h/o GESTATIONAL DIABETES (ICD-648.80) 6)  Dyspnea 7)    a.  myoview in May 2010 neg for ischemia 8)    b.  CXR with chronic bronchitic changes 9)    c.  CT neg for pul emboli May  2010 10)    d.  PFTs pending in 04/2009 . . . PFTs inconclusive due to poor technique (05/08/2009) 11)  Low back pain 12)    a.  xrays ok except for evidence of spasm 03/07/2009 13)    b.  patient failed PT and d/c'd 14)    c.  MRI ordered 05/01/2009 . . . 06/01/2009:  L5-S1 disc degen. & caudal migration of disc material; abuts bilat S1 nerve roots 15)  Dysphagia 16)    a.  normal UGI 02/2009 17)  Constipation 18)    a.   normal colonoscopy in 01/2009 19)    b.  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  Sincerely,  Tereso Newcomer PA-C

## 2010-07-16 NOTE — Letter (Signed)
Summary: REFERRAL//RADIOLOGY  REFERRAL//RADIOLOGY   Imported By: Arta Bruce 08/06/2009 15:58:35  _____________________________________________________________________  External Attachment:    Type:   Image     Comment:   External Document
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
## 2010-07-16 NOTE — Progress Notes (Signed)
Summary: balance is off and shaking  Phone Note Call from Patient Call back at Home Phone [phone]   Summary of Call: Devontaye Ground PT. MS Bergeman SAYS THAT SHE HAS THE SHAKES AND HER BALANCE IS OFF. SHE THINKS THAT ONE OF HER MEDICINES IS CAUSING THIS. SHE NOTICE IT AFTER SHE TOOK HER LAST INJECTION IN HER BACK. Initial call taken by: Leodis Rains,  August 09, 2009 1:03 PM  Follow-up for Phone Call        pt is appt Follow-up by: [name],  August 09, 2009 4:37 PM

## 2010-07-16 NOTE — Letter (Signed)
Summary: TEST ORDER FORM/MRI  TEST ORDER FORM/MRI   Imported By: Arta  03/05/2010 09:44:26  _____________________________________________________________________  External Attachment:    Type:   Image     Comment:   External Document

## 2010-07-16 NOTE — Letter (Signed)
Summary: Handout Printed  Printed Handout:  - Trichomonas Infection-Brief 

## 2010-07-16 NOTE — Letter (Signed)
Summary: *HSN Results Follow up  [facility]  [address] [place], [place] [address]   Phone: [phone]  Fax: [phone]      03/22/2010   VICENTA OLDS [address] [place], [place]  [address]   Dear  Ms. Tiajah Anwar,                            ____S.Drinkard,FNP   ____D. Gore,FNP       ____B. McPherson,MD   ____V. Rankins,MD    ____E. Mulberry,MD    ____N. Daphine Deutscher, FNP  ____D. Reche Dixon, MD    ____K. Philipp Deputy, MD    __x__S. Alben Spittle, PA-C     This letter is to inform you that your recent test(s):  ___x____Pap Smear    _______Lab Test     _______X-ray    __x____ is normal  _______ requires a medication change  _______ requires a follow-up lab visit  _______ requires a follow-up visit with your provider   Comments:       _________________________________________________________ If you have any questions, please contact our office                     Sincerely,  Tereso Newcomer PA-C [facility]

## 2010-07-16 NOTE — Progress Notes (Signed)
Summary: X-RAYS RESULTS/////GK  Phone Note Call from Patient   Summary of Call: The pt needs to receive her x-rays results, so please call her back. [phone] Alben Spittle PA-c Initial call taken by: Manon Hilding,  December 24, 2009 3:05 PM  Follow-up for Phone Call        forward to provider Follow-up by: [name],  December 25, 2009 8:41 AM  Additional Follow-up for Phone Call Additional follow up Details #1::        MS Alpert CALLED AGIAN ABOUT GETTING HER ERSULTS. SHE SAYS THAT HER KNEE IS IN A LOT OF PAIN AND IS NOT BETTER. Additional Follow-up by: Leodis Rains,  December 25, 2009 12:20 PM    Additional Follow-up for Phone Call Additional follow up Details #2::    xray completely normal  Follow-up by: Brynda Rim,  December 25, 2009 9:09 PM  Additional Follow-up for Phone Call Additional follow up Details #3:: Details for Additional Follow-up Action Taken: line is busy... [name]  December 26, 2009 10:39 AM  Left message with lady for pt to call back.Marland KitchenMarland KitchenMarland KitchenArmenia Shannon  December 26, 2009 11:50 AM  pt says her leg throbs.... pt wants to know why.. pt would like to know if she can be referred to an orthopedic... [name]  December 26, 2009 2:19 PM  Arrange follow up. If she cannot wait, see if one of the other providers can see her while I am gone if they have an opening.  Pt. scheduled for 01/02/10. Encouraged to apply ice to knee, keep elevated when poss. continue meds as prescribed. Gaylyn Cheers RN  December 28, 2009 9:53 AM  Additional Follow-up by: Tereso Newcomer PA-C,  December 26, 2009 5:04 PM

## 2010-07-16 NOTE — Progress Notes (Signed)
Summary: CHECKING ON HER GASTRO REFERRRAL  Phone Note Call from Patient Call back at Home Phone [phone]   Reason for Call: Referral Summary of Call: Maureen [name] IS CALLING TO SEE IF HER GASTRO. REFERRAL HAS BEEN MADE.  Initial call taken by: Leodis Rains,  July 12, 2009 4:59 PM  Follow-up for Phone Call        advised pt it is not completed and as soon as we have an appointment set up someone will give her a call. pt was ok. Follow-up by: Mikey College CMA,  July 13, 2009 10:04 AM

## 2010-07-16 NOTE — Assessment & Plan Note (Signed)
Summary: CPP   Vital Signs:  Patient profile:   45 year old female Height:      65 inches Weight:      192 pounds Temp:     99.1 degrees F oral Pulse rate:   88 / minute Pulse rhythm:   regular Resp:     18 per minute BP sitting:   130 / 94  (left arm) Cuff size:   large  Vitals Entered By: [name] (March 18, 2010 2:53 PM) CC: cpe..., Hypertension Management Is Patient Diabetic? No Pain Assessment Patient in pain? no       Does patient need assistance? Functional Status Self care Ambulation Normal   Primary Care Provider:  Tereso Newcomer PA-C  CC:  cpe... and Hypertension Management.  History of Present Illness: Here for CPP.  Does have a h/o abnl pap (LGSIL). No menses since June. FSH high back in May . Marland Kitchen .perimenopausal. No vaginal bleeding. No discharge, odor, itching. No STD concerns. Not sexually active. Mammo done in Jan . . .Ok. Goes to [facility] for schizophrenia.   Hypertension History:      Positive major cardiovascular risk factors include hypertension and family history for ischemic heart disease (females less than 76 years old).  Negative major cardiovascular risk factors include female age less than 27 years old, no history of hyperlipidemia, and non-tobacco-user status.        Further assessment for target organ damage reveals no history of ASHD, stroke/TIA, peripheral vascular disease, renal insufficiency, or hypertensive retinopathy.    Habits & Providers  Alcohol-Tobacco-Diet     Tobacco Status: never  Problems Prior to Update: 1)  Medial Meniscus Tear, Right  (ICD-836.0) 2)  Knee Pain, Right  (ICD-719.46) 3)  Amenorrhea, Secondary  (ICD-626.0) 4)  Herpes Zoster  (ICD-053.9) 5)  Low Back Pain  (ICD-724.2) 6)  Ear Pain, Left  (ICD-388.70) 7)  Helicobacter Pylori Infection  (ICD-041.86) 8)  Dyspnea  (ICD-786.05) 9)  Schizophrenia  (ICD-295.90) 10)  Urinalysis, Abnormal  (ICD-791.9) 11)  Chest Pain Unspecified  (ICD-786.50) 12)   Family History of Cad Female 1st Degree Relative <60  (ICD-V16.49) 13)  Vaginal Trichomoniasis  (ICD-131.01) 14)  Dizziness  (ICD-780.4) 15)  Joint Effusion, Left Knee  (ICD-719.06) 16)  Otogenic Pain  (ICD-388.71) 17)  Insomnia, Chronic  (ICD-307.42) 18)  Depression  (ICD-311) 19)  Adnexal Mass, Left  (ICD-789.39) 20)  Examination, Routine Medical  (ICD-V70.0) 21)  Unspecified Breast Screening  (ICD-V76.10) 22)  Otitis Media, Left  (ICD-382.9) 23)  Constipation  (ICD-564.00) 24)  Gerd  (ICD-530.81) 25)  Back Pain, Lumbar  (ICD-724.2) 26)  Hypertension  (ICD-401.9) 27)  Abuse, Alcohol, 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 One Tablet By Mouth Daily For Potassium 2)  Protonix 40 Mg Tbec (Pantoprazole Sodium) .... Take 1 Tablet By Mouth Two Times A Day 3)  Robaxin 500 Mg Tabs (Methocarbamol) .Marland Kitchen.. 1-2 Tabs Every 6-8 Hours As Needed Muscle Spasm 4)  Zoloft 100 Mg Tabs (Sertraline Hcl) .... 2  By Mouth At Bedtime 5)  Maxzide-25 37.5-25 Mg Tabs (Triamterene-Hctz) .... One Tablet By Mouth Daily For Blood Pressure 6)  Benztropine Mesylate 1 Mg Tabs (Benztropine Mesylate) .... Take 1 Tablet By Mouth Two Times A Day 7)  Ventolin Hfa 108 (90 Base) Mcg/act  Aers (Albuterol Sulfate) .... 2 Inhalations Every 6 Hours For Shortness of Breath 8)  Advair Diskus 500-50 Mcg/dose Aepb (Fluticasone-Salmeterol) .... Take 1 Puff Two Times A Day 9)  Revia 50 Mg Tabs (Naltrexone Hcl) .... Take 1 Tablet By Mouth Once A Day (Per [facility]) 10)  Miralax  Powd (Polyethylene Glycol 3350) .... Dissolve One Capful in 8 Oz of Water and Drink Once Per Day As Needed For Constipation 11)  Seroquel Xr 200 Mg Xr24h-Tab (Quetiapine Fumarate) .... Take 2 Tabs At Bedtime 12)  One A Day Multivitamin For Women .... Once  Daily 13)  Nabumetone 500 Mg Tabs (Nabumetone) .Marland Kitchen.. 1-2 Tabs By Mouth Every 12 Hours As Needed For Pain; Please Take With Food 14)  Capsaicin 0.025 % Crea (Capsaicin) .... Apply Three Times A Day To Four Times A Day As Needed For Pain 15)  Vistaril 25 Mg Caps (Hydroxyzine Pamoate) .... Take 1 Cap By Mouth 1 Hour Before Mri.  May Repeat X 1 If Still Having Symptoms of Claustrophobia Before Test  Allergies (verified): 1)  ! Penicillin  Past History:  Past Medical History: Last updated: 10/01/2009 Current Problems:  DYSLIPIDEMIA (ICD-272.4) h/o ANEMIA NOS (ICD-285.9) Hypertension h/o GESTATIONAL DIABETES (ICD-648.80) Dyspnea   a.  myoview in May 2010 neg for ischemia   b.  CXR with chronic bronchitic changes   c.  CT neg for pul emboli May 2010   d.  PFTs pending in 04/2009 . . . PFTs inconclusive due to poor technique (05/08/2009) Low back pain   a.  xrays ok except for evidence of spasm 03/07/2009   b.  patient failed PT and d/c'd   c.  MRI ordered 05/01/2009 . . . 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  normal UGI 02/2009   b.  EGD with Dr. Charlott Rakes done 08/2009; nothing significant found Constipation   a.  normal colonoscopy in 01/2009   b.  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  recovering alcoholic; on [facility] per [facility] Drug use-yes...cocaine in past Never Smoked  Review of Systems      See  HPI General:  Denies chills and fever. CV:  Denies chest pain or discomfort, fainting, and shortness of breath with exertion. Resp:  Denies cough. GI:  Complains of indigestion; denies bloody stools and dark tarry stools; out of PPI. GU:  Denies dysuria. MS:  Complains of joint pain; R knee pain and now L knee hurts. Psych:  f/u with [facility] . Heme:  Denies bleeding.  Physical Exam  General:  alert, well-developed, and well-nourished.   Head:  normocephalic and atraumatic.   Eyes:  pupils equal, pupils round, and pupils reactive to light.   Ears:  R ear normal and L ear normal.   Nose:  no external deformity.   Mouth:  pharynx pink and moist, no erythema, and no exudates.   Neck:  supple, no thyromegaly, and no cervical lymphadenopathy.   Chest Wall:  no deformities.   Breasts:  skin/areolae normal, no masses, no abnormal thickening, no nipple discharge, no tenderness, and no adenopathy.   Lungs:  normal breath sounds, no crackles, and no wheezes.   Heart:  normal rate, regular rhythm, and no murmur.   Abdomen:  soft, non-tender, and no hepatomegaly.   Rectal:  no external abnormalities.   Genitalia:  normal introitus, no external lesions, mucosa pink and moist, no vaginal or cervical lesions, no vaginal atrophy, no friaility or hemorrhage, normal uterus size and position, no adnexal masses or tenderness, and vaginal discharge.   Msk:  no joint deformities.   Extremities:  no edema  Neurologic:  alert & oriented X3 and cranial nerves II-XII intact.   Skin:  turgor normal.   Psych:  normally interactive.     Impression & Recommendations:  Problem # 1:  EXAMINATION, ROUTINE MEDICAL (ICD-V70.0)  Orders: UA Dipstick w/o Micro (manual) (11914) KOH/ WET Mount [phone]) T-Pap Smear, Thin Prep (62130)  Problem # 2:  MEDIAL MENISCUS TEAR, RIGHT (ICD-836.0) referral to ortho pending  Problem # 3:  GERD (ICD-530.81) out of meds restart PPI at once daily   Her updated medication  list for this problem includes:    Protonix 40 Mg Tbec (Pantoprazole sodium) .Marland Kitchen... Take 1 tablet by mouth once daily  Problem # 4:  AMENORRHEA, SECONDARY (ICD-626.0) perimenopausal  Problem # 5:  SCHIZOPHRENIA (ICD-295.90) f/u at [facility]  Problem # 6:  HYPERTENSION (ICD-401.9) usually better controlled  Her updated medication list for this problem includes:    Maxzide-25 37.5-25 Mg Tabs (Triamterene-hctz) ..... 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 One tablet by mouth daily for potassium 2)  Protonix 40 Mg Tbec (Pantoprazole sodium) .... Take 1 tablet by mouth once daily 3)  Robaxin 500 Mg Tabs (Methocarbamol) .Marland Kitchen.. 1-2 tabs every 6-8 hours as needed muscle spasm 4)  Zoloft 100 Mg Tabs (Sertraline hcl) .... 2  by mouth at bedtime 5)  Maxzide-25 37.5-25 Mg Tabs (Triamterene-hctz) .... One tablet by mouth daily for blood pressure 6)  Benztropine Mesylate 1 Mg Tabs (Benztropine mesylate) .... Take 1 tablet by mouth two times a day 7)  Ventolin Hfa 108 (90 Base) Mcg/act Aers (Albuterol sulfate) .... 2 inhalations every 6 hours for shortness of breath 8)  Advair Diskus 500-50 Mcg/dose Aepb (Fluticasone-salmeterol) .... Take 1 puff two times a day 9)  Revia 50 Mg Tabs (Naltrexone hcl) .... Take 1 tablet by mouth once a day (per [facility]) 10)  Miralax Powd (Polyethylene glycol 3350) .... Dissolve one capful in 8 oz of water and drink once per day as needed for constipation 11)  Seroquel Xr 200 Mg Xr24h-tab (Quetiapine fumarate) .... Take 2 tabs at bedtime 12)  One A Day Multivitamin For Women  .... Once daily 13)   Nabumetone 500 Mg Tabs (Nabumetone) .Marland Kitchen.. 1-2 tabs by mouth every 12 hours as needed for pain; please take with food 14)  Capsaicin 0.025 % Crea (Capsaicin) .... Apply three times a day to four times a day as needed for pain 15)  Vistaril 25 Mg Caps (Hydroxyzine pamoate) .... Take 1 cap by mouth 1 hour before mri.  may repeat x 1 if still having symptoms of claustrophobia before test 16)  Flagyl 500 Mg Tabs (Metronidazole) .... Take 1 tablet by mouth two times a day  Hypertension Assessment/Plan:      The patient's hypertensive risk group is category B: At least one risk factor (excluding diabetes) with no target organ damage.  Today's blood pressure is 130/94.  Her blood pressure goal is < 140/90.   Patient Instructions: 1)  Schedule FLP at your convenience.  Come fasting. 2)  Repeat urinalysis in 3 weeks. 3)  Take Flagyl until all gone. 4)  See handout. 5)  Please schedule a follow-up appointment in 3 months for blood pressure. 6)  Someone should call you about the referral to the orthopedist. 7)  I faxed Flagyl and Protonix to the [facility] pharmacy.  These medicines should be ready in 3-4 days. 8)    Prescriptions: FLAGYL 500 MG TABS (METRONIDAZOLE) Take 1 tablet by mouth two times a day  #14 x 0   Entered and Authorized by:   Tereso Newcomer PA-C   Signed by:   Tereso Newcomer PA-C on 03/18/2010   Method used:   Faxed to ...       [facility] (retail)       [address] [place], [place]  [address]       Ph: [phone]       Fax: [phone]   RxID:   [id number] PROTONIX 40 MG TBEC (PANTOPRAZOLE SODIUM) Take 1 tablet by mouth once daily  #30 x 5   Entered and Authorized by:   Tereso Newcomer PA-C   Signed by:   Tereso Newcomer PA-C on 03/18/2010   Method used:   Faxed to ...       [facility] (retail)       [address] [place], [place]  [address]       Ph: [phone]       Fax:  [phone]   RxID:   [id number]   Laboratory Results   Urine Tests    Routine Urinalysis   Glucose: negative   (Normal Range: Negative) Bilirubin: negative   (Normal Range: Negative) Ketone: trace (5)   (Normal Range: Negative) Spec. Gravity: >=1.030   (Normal Range: 1.003-1.035) Blood: negative   (Normal Range: Negative) pH: 5.5   (Normal Range: 5.0-8.0) Protein: 30   (Normal Range: Negative) Urobilinogen: 0.2   (Normal Range: 0-1) Nitrite: negative   (Normal Range: Negative) Leukocyte Esterace: negative   (Normal Range: Negative)      Wet Mount Source: vaginal WBC/hpf: 5-10 Bacteria/hpf: rare Clue cells/hpf: few  Negative whiff Yeast/hpf: none Wet Mount KOH: Negative Trichomonas/hpf: few

## 2010-07-16 NOTE — Assessment & Plan Note (Signed)
Summary: SHINGLES//SS   Vital Signs:  Patient profile:   45 year old female LMP:     07/2009 Weight:      187.6 pounds BMI:     31.33 Temp:     98.0 degrees F oral Pulse rate:   76 / minute Pulse rhythm:   regular Resp:     20 per minute BP sitting:   126 / 90  (left arm) Cuff size:   large  Vitals Entered By: Levon Hedger (August 20, 2009 2:41 PM) CC: irritation on back x 1  1/2 weeks  Is Patient Diabetic? No Pain Assessment Patient in pain? yes     Location: back Intensity: 7  Does patient need assistance? Functional Status Self care Ambulation Normal LMP (date): 07/2009 LMP - Character: normal Menarche (age onset years): 15   Menses interval (days): 21 Menstrual flow (days): 3 Enter LMP: 07/2009 Last PAP Result  Specimen Adequacy: Satisfactory for evaluation.   Interpretation/Result:Negative for intraepithelial Lesion or Malignancy.   Interpretation/Result:Trichomonas Vaginalis present.       CC:  irritation on back x 1  1/2 weeks .  History of Present Illness: Developed rash on right flank about 2 weeks ago. No fevers or chills. Dr. at radiology that did ESI said it was shingles. She has not had before. She has minimal pain. Has noted some newer lesions crop up in last couple days.   Allergies (verified): 1)  ! Penicillin  Physical Exam  General:  alert, well-developed, and well-nourished.   Head:  normocephalic and atraumatic.   Lungs:  normal breath sounds.   Heart:  normal rate and regular rhythm.   Neurologic:  alert & oriented X3 and cranial nerves II-XII intact.   Skin:  rash in dermatomal distribution over right flank ulcerative type lesions and excoriations noted no erythema no brand new lesions noted. Psych:  normally interactive.     Impression & Recommendations:  Problem # 1:  HERPES ZOSTER (ICD-053.9) with new lesions breaking out in last couple days, will go ahead and treat  Problem # 2:  INSOMNIA, CHRONIC  (ICD-307.42) discussed with patient that trazodone should only be taken as needed she should not take daily she should discuss further with her psychiatrist advised her to avoid caffeine before bed and discussed good sleep hygiene  Complete Medication List: 1)  Potassium Chloride Crys Cr 20 Meq Cr-tabs (Potassium chloride crys cr) .... One tablet by mouth daily for potassium 2)  Protonix 40 Mg Tbec (Pantoprazole sodium) .... Take 1 tablet by mouth two times a day 3)  Robaxin 500 Mg Tabs (Methocarbamol) .Marland Kitchen.. 1-2 tabs every 6-8 hours as needed muscle spasm 4)  Zoloft 100 Mg Tabs (Sertraline hcl) .Marland Kitchen.. 1 1/2 by mouth at bedtime 5)  Maxzide-25 37.5-25 Mg Tabs (Triamterene-hctz) .... One tablet by mouth daily for blood pressure 6)  Trazodone Hcl 50 Mg Tabs (Trazodone hcl) .... 3 tablets by mouth at bedtime as needed 7)  Benztropine Mesylate 1 Mg Tabs (Benztropine mesylate) .Marland Kitchen.. 1 by mouth at bedtime 8)  Thiothixene 10 Mg Caps (Thiothixene) .Marland Kitchen.. 1 by mouth at bedtime 9)  Antivert 12.5 Mg Tabs (Meclizine hcl) .... One pill every 6 hours for dizziness 10)  Naproxen 500 Mg Tabs (Naproxen) .Marland Kitchen.. 1 tablet by mouth every 12 hours as needed for pain 11)  Ventolin Hfa 108 (90 Base) Mcg/act Aers (Albuterol sulfate) .... 2 inhalations every 6 hours for shortness of breath 12)  Metronidazole 500 Mg Tabs (Metronidazole) .... 4 tablets  by mouth x 1 dose 13)  Advair Diskus 500-50 Mcg/dose Aepb (Fluticasone-salmeterol) .... Take 1 puff two times a day 14)  Helidac Misc (Metronid-tetracyc-bis subsal) .Marland Kitchen.. 1 dose by mouth 4 times a day for 2 weeks 15)  Cortisporin 3.5-10000-1 Soln (Neomycin-polymyxin-hc) .... 4 gtts in left ear three times a day for 7 days 16)  Revia 50 Mg Tabs (Naltrexone hcl) .... Take 1 tablet by mouth once a day (per [facility]) 17)  Miralax Powd (Polyethylene glycol 3350) .... Dissolve one capful in 8 oz of water and drink once per day as needed for constipation 18)  Valtrex 1 Gm Tabs (Valacyclovir  hcl) .... Take 1 tablet by mouth three times a day for 7 days  Patient Instructions: 1)  Discuss your trouble sleeping with your psychiatrist. 2)  Take the valacyclovir until it is all gone. 3)  You can put neosporin on the open sores to prevent infection. Prescriptions: VALTREX 1 GM TABS (VALACYCLOVIR HCL) Take 1 tablet by mouth three times a day for 7 days  #21 x 0   Entered and Authorized by:   Tereso Newcomer PA-C   Signed by:   Tereso Newcomer PA-C on 08/20/2009   Method used:   Print then Give to Patient   RxID:   [id number]

## 2010-07-16 NOTE — Letter (Signed)
Summary: Generic Letter  [facility]  [address] [place], [place] [address]   Phone: [phone]  Fax: [phone]    02/15/2010  To whom it may concern:  Maureen Swanson (DOB August 01, 1965) is a patient of mine.  I saw her in July to evaluate her knee.  She can wear her knee brace as needed if it helps her knee pain.           Sincerely,   Tereso Newcomer PA-C

## 2010-07-16 NOTE — Progress Notes (Signed)
Summary: KNEE PAIN IS NO BETTER  Phone Note Call from Patient Call back at Home Phone [phone]   Reason for Call: Referral Summary of Call: [name] CALLING TO CHECK ON HER ORTHO REFERRAL. SHE SAYS HER KNEE IS REALLY THROBBING AND PAINFUL, SHE IS WALKING WITH CRUTCHES AND HER CAIN, BUT THIS HAS BEEN GOING ON FOR A MONTH AND SOMETHING NEEDS TO BE DONE AS SOON AS POSSIBLE. Initial call taken by: Leodis Rains,  March 26, 2010 12:07 PM  Follow-up for Phone Call        Left message on answering machine for pt to call back.Marland KitchenMarland KitchenMarland KitchenArmenia Shannon  March 26, 2010 3:41 PM   Additional Follow-up for Phone Call Additional follow up Details #1::        spoke with pt and let her know that i have not heard from [facility].... pt says she will go anywhere because she is in alot of pain....  Additional Follow-up by: [name],  March 27, 2010 10:06 AM

## 2010-07-16 NOTE — Letter (Signed)
Summary: Handout Printed  Printed Handout:  - Diet - Low-Cholesterol Guidelines 

## 2010-07-16 NOTE — Progress Notes (Signed)
  Phone Note Call from Patient   Summary of Call: spoke with pt and she has she is trying to get disability. pt says she feels as if the injections is not working so thats why she is applying for disability.. pt says she is going to give the med a week to get into her system but the lady at [facility] imgaging let her know that if the injection doesn't make her feel better this time it is no need to get the third injection...Marland KitchenMarland KitchenMarland Kitchen she is wondering have you seen the forms yet Initial call taken by: [name],  July 26, 2009 4:40 PM  Follow-up for Phone Call        Her back problem is not exactly something I think she would get disability for. I would think that her mental health issues would qualify her for disability before her back problems. I realize she has pain, but the MRI does not show severe problems by any means.  The injections were an attempt to control her pain.   I have not seen any forms yet. When I get them, I'll look at them. Follow-up by: Tereso Newcomer PA-C,  July 26, 2009 10:41 PM  Additional Follow-up for Phone Call Additional follow up Details #1::        spoke with pt and she is aware Additional Follow-up by: [name],  July 27, 2009 4:21 PM

## 2010-07-16 NOTE — Letter (Signed)
Summary: MAILED REQUESTED RECORDS TO [facility]  MAILED REQUESTED RECORDS TO [facility]   Imported By: Arta Bruce 04/12/2010 10:02:46  _____________________________________________________________________  External Attachment:    Type:   Image     Comment:   External Document

## 2010-07-16 NOTE — Progress Notes (Signed)
Summary: Query:  Refill Naproxen?  Phone Note Outgoing Call   Summary of Call: Received refill request for Naproxen -- you removed it from list in July.  Do you want to write a new Rx?  Last dispensed 02/19/10. Initial call taken by: Dutch Quint RN,  March 15, 2010 10:15 AM  Follow-up for Phone Call        No.  Naproxen changed to Nabumetone.  Follow-up by: Tereso Newcomer PA-C,  March 15, 2010 1:22 PM  Additional Follow-up for Phone Call Additional follow up Details #1::        Noted.  Denial faxed to pharmacy.  Pt. made aware. Dutch Quint RN  March 15, 2010 3:00 PM

## 2010-07-16 NOTE — Letter (Signed)
Summary: Generic Letter  [facility]  [address] [place], [place] [address]   Phone: [phone]  Fax: [phone]    03/01/2010    To whom it may concern:  Maureen Swanson (DOB 09-03-1965) is my patient.  She has knee pain.  She may wear her brace.  She should refrain from prolonged standing, running, excessive walking, lifting, deep knee bends, twisting (of the knee) or jumping.  She will be under my care for this problem for at least the next 4 weeks.  The medications that I have prescribed for her include an anti-inflammatory and a cream that is a local analgesic.  Please feel free to contact me with any questions.          Sincerely,   Tereso Newcomer PA-C

## 2010-07-16 NOTE — Progress Notes (Signed)
  Phone Note Call from Patient Call back at Home Phone [phone] Call back at [phone]   Summary of Call: The pt wants the medical assistant gave her the list of all the mediation that she is taking and what is the reason she is taking those medication.  Alben Spittle PA-c Initial call taken by: Manon Hilding,  Oct 25, 2009 3:03 PM  Follow-up for Phone Call        forward to provider to review.... Follow-up by: Mikey College CMA,  Oct 26, 2009 2:37 PM  Additional Follow-up for Phone Call Additional follow up Details #1::        seen today Additional Follow-up by: Brynda Rim,  Oct 29, 2009 5:38 PM

## 2010-07-16 NOTE — Progress Notes (Signed)
  Phone Note Outgoing Call   Summary of Call: Keep getting refill requests for trazodone. I explained to the patient when I last saw her that she should not take this medication on a daily basis. She is getting refills every month. I asked her to discuss her sleeping problems with her psychiatrist for further recommendations. Initial call taken by: Tereso Newcomer PA-C,  August 24, 2009 3:19 PM  Follow-up for Phone Call        she says she has discussed with psych and they have not came up with anything yet...  pt says she does need refill and she is only taking as needed Follow-up by: [name],  August 29, 2009 3:54 PM    Prescriptions: TRAZODONE HCL 50 MG TABS (TRAZODONE HCL) 3 tablets by mouth at bedtime as needed  #90 Each x 0   Entered and Authorized by:   Tereso Newcomer PA-C   Signed by:   Tereso Newcomer PA-C on 08/29/2009   Method used:   Printed then faxed to ...       [facility] (retail)       [address] [place], [place]  [address]       Ph: [phone]       Fax: [phone]   RxID:   [id number]

## 2010-07-16 NOTE — Progress Notes (Signed)
Summary: Refill Medications  Phone Note Call from Patient Call back at Home Phone [phone]   Summary of Call: The pt needs more refills from her naproxen, and trazodone medications.  [facility]) Alben Spittle PA-c Initial call taken by: Manon Hilding,  July 26, 2009 2:58 PM  Follow-up for Phone Call        meds are at pharmacy.... called in trazadone but she had refills on naproxen Follow-up by: [name],  July 26, 2009 4:36 PM

## 2010-07-16 NOTE — Assessment & Plan Note (Signed)
Summary: DISCUSS MEDICATIONS//GK   Vital Signs:  Patient profile:   45 year old female Height:      65 inches Weight:      191 pounds BMI:     31.90 Temp:     98.1 degrees F oral Pulse rate:   88 / minute Pulse rhythm:   regular Resp:     18 per minute BP sitting:   146 / 105  (left arm) Cuff size:   large  Vitals Entered By: [name] (Oct 29, 2009 12:02 PM) CC: pt is here to discuss meds.. pt says she did urine test and was positive for marjuanna... pt would like for you to do a drug screen and let her know the results and find out if one of her meds is making her positive for marjuanna, Hypertension Management  Does patient need assistance? Functional Status Self care Ambulation Normal   Primary Care Provider:  Tereso Newcomer PA-C  CC:  pt is here to discuss meds.. pt says she did urine test and was positive for marjuanna... pt would like for you to do a drug screen and let her know the results and find out if one of her meds is making her positive for marjuanna and Hypertension Management.  History of Present Illness: Came in today to look over her meds. She has a h/o drug and alcohol abuse.  She is in [employer]ilities [employer]." She states her UDS recently came back + for THC and benzo's. She denies using these.   Of note, she was in [facility] recently for worsening depression.  Her meds were adjusted.  Of note, I had recently spoken to her RN at [facility].  I was told that they would handle her meds for sleep.  She had been getting Trazodone filled a lot and I had expressed concern to her about how she was taking this.  She also notes that she has not had a period in 4 months.  She does not think she is pregnant.  Denies being sexually active.    Hypertension History:      She complains of headache, but denies chest pain, dyspnea with exertion, and syncope.  Further comments include: no meds today.        Positive major cardiovascular risk factors include hypertension and family  history for ischemic heart disease (females less than 81 years old).  Negative major cardiovascular risk factors include female age less than 52 years old, no history of hyperlipidemia, and non-tobacco-user status.        Further assessment for target organ damage reveals no history of ASHD, stroke/TIA, peripheral vascular disease, renal insufficiency, or hypertensive retinopathy.    Problems Prior to Update: 1)  Amenorrhea, Secondary  (ICD-626.0) 2)  Herpes Zoster  (ICD-053.9) 3)  Low Back Pain  (ICD-724.2) 4)  Ear Pain, Left  (ICD-388.70) 5)  Helicobacter Pylori Infection  (ICD-041.86) 6)  Dyspnea  (ICD-786.05) 7)  Schizophrenia  (ICD-295.90) 8)  Urinalysis, Abnormal  (ICD-791.9) 9)  Chest Pain Unspecified  (ICD-786.50) 10)  Family History of Cad Female 1st Degree Relative <60  (ICD-V16.49) 11)  Vaginal Trichomoniasis  (ICD-131.01) 12)  Dizziness  (ICD-780.4) 13)  Joint Effusion, Left Knee  (ICD-719.06) 14)  Otogenic Pain  (ICD-388.71) 15)  Insomnia, Chronic  (ICD-307.42) 16)  Depression  (ICD-311) 17)  Adnexal Mass, Left  (ICD-789.39) 18)  Examination, Routine Medical  (ICD-V70.0) 19)  Unspecified Breast Screening  (ICD-V76.10) 20)  Otitis Media, Left  (ICD-382.9) 21)  Constipation  (  ICD-564.00) 22)  Gerd  (ICD-530.81) 23)  Back Pain, Lumbar  (ICD-724.2) 24)  Hypertension  (ICD-401.9) 25)  Abuse, Alcohol, Episodic  (ICD-305.02) 26)  Abuse, Cocaine, Episodic  (ICD-305.62) 27)  Pap Smear, Lgsil, Abnormal  (ICD-795.09) 28)  Anemia Nos  (ICD-285.9)  Current Medications (verified): 1)  Potassium Chloride Crys Cr 20 Meq Cr-Tabs (Potassium Chloride Crys Cr) .... One Tablet By Mouth Daily For Potassium 2)  Protonix 40 Mg Tbec (Pantoprazole Sodium) .... Take 1 Tablet By Mouth Two Times A Day 3)  Robaxin 500 Mg Tabs (Methocarbamol) .Marland Kitchen.. 1-2 Tabs Every 6-8 Hours As Needed Muscle Spasm 4)  Zoloft 100 Mg Tabs (Sertraline Hcl) .... 2  By Mouth At Bedtime 5)  Maxzide-25 37.5-25 Mg Tabs  (Triamterene-Hctz) .... One Tablet By Mouth Daily For Blood Pressure 6)  Benztropine Mesylate 1 Mg Tabs (Benztropine Mesylate) .... Take 1 Tablet By Mouth Two Times A Day 7)  Naproxen 500 Mg Tabs (Naproxen) .Marland Kitchen.. 1 Tablet By Mouth Every 12 Hours As Needed For Pain 8)  Ventolin Hfa 108 (90 Base) Mcg/act Aers (Albuterol Sulfate) .... 2 Inhalations Every 6 Hours For Shortness of Breath 9)  Advair Diskus 500-50 Mcg/dose Aepb (Fluticasone-Salmeterol) .... Take 1 Puff Two Times A Day 10)  Revia 50 Mg Tabs (Naltrexone Hcl) .... Take 1 Tablet By Mouth Once A Day (Per [facility]) 11)  Miralax  Powd (Polyethylene Glycol 3350) .... Dissolve One Capful in 8 Oz of Water and Drink Once Per Day As Needed For Constipation 12)  Seroquel Xr 200 Mg Xr24h-Tab (Quetiapine Fumarate) .... Take 2 Tabs At Bedtime 13)  One A Day Multivitamin For Women .... Once Daily 14)  Hydrocodone-Acetaminophen 5-500 Mg Tabs (Hydrocodone-Acetaminophen) .... Take One By Mouth Every 6 Hours As Needed For Pain (Dr. Otila Kluver Prescribed 4.4.2011)  Allergies (verified): 1)  ! Penicillin  Past History:  Past Medical History: Last updated: 10/01/2009 Current Problems:  DYSLIPIDEMIA (ICD-272.4) h/o ANEMIA NOS (ICD-285.9) Hypertension h/o GESTATIONAL DIABETES (ICD-648.80) Dyspnea   a.  myoview in May 2010 neg for ischemia   b.  CXR with chronic bronchitic changes   c.  CT neg for pul emboli May 2010   d.  PFTs pending in 04/2009 . . . PFTs inconclusive due to poor technique (05/08/2009) Low back pain   a.  xrays ok except for evidence of spasm 03/07/2009   b.  patient failed PT and d/c'd   c.  MRI ordered 05/01/2009 . . . 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  normal UGI 02/2009   b.  EGD with Dr. Charlott Rakes done 08/2009; nothing significant found Constipation   a.  normal colonoscopy in 01/2009   b.  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   Head:  normocephalic and atraumatic.   Eyes:  pupils equal, pupils round, pupils reactive to light, and no optic disk abnormalities.   Neck:  supple.   Lungs:  normal breath sounds.   Heart:  normal rate and regular rhythm.   Neurologic:  alert & oriented X3 and cranial nerves II-XII intact.  somewhat dyskinetic at times   Psych:  Oriented X3.     Impression & Recommendations:  Problem # 1:  HYPERTENSION (ICD-401.9)  out of control no meds today not taking some meds due to + UDS at [place]" advised her to take med return in next 1-2 days for BP check with the RN if stays up, add amlodipine  Her updated medication list for this problem includes:    Maxzide-25 37.5-25 Mg Tabs (Triamterene-hctz) ..... One tablet by mouth daily for blood pressure  Orders: T-Basic Metabolic Panel [phone])  Problem # 2:  AMENORRHEA, SECONDARY (ICD-626.0)  check TSH, Prolactin and Urine Preg. to initiate w/u suspect related to her psych meds  Orders: Urine Pregnancy Test  [phone]) T-Prolactin [phone]) T-TSH [phone]) T-Pregnancy (Serum), Qual.  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 . . the only thing that would come clos is methcarbamol but it is not a benzo she can stop taking the methocarbamol for now advised her to not take Vicodin with her hx  Orders: T-Drug Screen-Urine, (single) [phone])  Complete Medication List: 1)  Potassium  Chloride Crys Cr 20 Meq Cr-tabs (Potassium chloride crys cr) .... One tablet by mouth daily for potassium 2)  Protonix 40 Mg Tbec (Pantoprazole sodium) .... Take 1 tablet by mouth two times a day 3)  Robaxin 500 Mg Tabs (Methocarbamol) .Marland Kitchen.. 1-2 tabs every 6-8 hours as needed muscle spasm 4)  Zoloft 100 Mg Tabs (Sertraline hcl) .... 2  by mouth at bedtime 5)  Maxzide-25 37.5-25 Mg Tabs (Triamterene-hctz) .... One tablet by mouth daily for blood pressure 6)  Benztropine Mesylate 1 Mg Tabs (Benztropine mesylate) .... Take 1 tablet by mouth two times a day 7)  Naproxen 500 Mg Tabs (Naproxen) .Marland Kitchen.. 1 tablet by mouth every 12 hours as needed for pain 8)  Ventolin Hfa 108 (90 Base) Mcg/act Aers (Albuterol sulfate) .... 2 inhalations every 6 hours for shortness of breath 9)  Advair Diskus 500-50 Mcg/dose Aepb (Fluticasone-salmeterol) .... Take 1 puff two times a day 10)  Revia 50 Mg Tabs (Naltrexone hcl) .... Take 1 tablet by mouth once a day (per [facility]) 11)  Miralax Powd (Polyethylene glycol 3350) .... Dissolve one capful in 8 oz of water and drink once per day as needed for constipation 12)  Seroquel Xr 200 Mg Xr24h-tab (Quetiapine fumarate) .... Take 2 tabs at bedtime 13)  One A Day Multivitamin For Women  .... Once daily 14)  Hydrocodone-acetaminophen 5-500 Mg Tabs (Hydrocodone-acetaminophen) .... Take one by mouth every 6 hours as needed for pain (dr. lawyer prescribed 4.4.2011)  Hypertension Assessment/Plan:      The patient's hypertensive risk group is category B: At least one risk factor (excluding diabetes) with no target organ damage.  Today's blood pressure is 146/105.  Her blood pressure goal is < 140/90.   Patient Instructions: 1)  Take your Maxzide as soon as you can today and take every day.  It is not causing your drug screen to be abnormal. 2)  The methacarbomal should not be causing any problems.  But, stop taking it for now with the recent positive urine test for benzodiazepines. 3)   You may want to ask your doctors if you got any benzodiazepines while you were hospitalized recently. 4)  Return to the lab in 1-2 days for BP check with the nurse.  5)  Please schedule a follow-up appointment in 1 month with Satori Krabill for blood pressure. 6)

## 2010-07-16 NOTE — Miscellaneous (Signed)
Summary: GI notes reviewed  Clinical Lists Changes  Problems: Assessed GERD as comment only - eval by Dr. Bosie Clos 08/14/2009 EGD +/- dilatation planned  Her updated medication list for this problem includes:    Protonix 40 Mg Tbec (Pantoprazole sodium) .Marland Kitchen... Take 1 tablet by mouth two times a day    Helidac Misc (Metronid-tetracyc-bis subsal) .Marland Kitchen... 1 dose by mouth 4 times a day for 2 weeks  Observations: Added new observation of PAST MED HX: Current Problems:  DYSLIPIDEMIA (ICD-272.4) h/o ANEMIA NOS (ICD-285.9) Hypertension h/o GESTATIONAL DIABETES (ICD-648.80) Dyspnea   a.  myoview in May 2010 neg for ischemia   b.  CXR with chronic bronchitic changes   c.  CT neg for pul emboli May 2010   d.  PFTs pending in 04/2009 . . . PFTs inconclusive due to poor technique (05/08/2009) Low back pain   a.  xrays ok except for evidence of spasm 03/07/2009   b.  patient failed PT and d/c'd   c.  MRI ordered 05/01/2009 . . . 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  normal UGI 02/2009   b.  EGD planned with Dr. Charlott Rakes (eval 08/14/2009) Constipation   a.  normal colonoscopy in 01/2009   b.  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  myoview in May 2010 neg for ischemia   b.  CXR with chronic bronchitic changes   c.  CT neg for pul emboli May 2010   d.  PFTs pending in 04/2009 . . . PFTs inconclusive due to poor  technique (05/08/2009) Low back pain   a.  xrays ok except for evidence of spasm 03/07/2009   b.  patient failed PT and d/c'd   c.  MRI ordered 05/01/2009 . . . 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  normal UGI 02/2009   b.  EGD planned with Dr. Charlott Rakes (eval 08/14/2009) Constipation   a.  normal colonoscopy in 01/2009   b.  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 Bosie Clos 08/14/2009 EGD +/- dilatation planned  Her updated medication list for this problem includes:    Protonix 40 Mg Tbec (Pantoprazole sodium) .Marland Kitchen... Take 1 tablet by mouth two times a day    Helidac Misc (Metronid-tetracyc-bis subsal) .Marland Kitchen... 1 dose by mouth 4 times a day for 2 weeks  Complete Medication List: 1)  Potassium Chloride Crys Cr 20 Meq Cr-tabs (Potassium chloride crys cr) .... One tablet by mouth daily for potassium 2)  Protonix 40 Mg Tbec (Pantoprazole sodium) .... Take 1 tablet by mouth two times a day 3)  Robaxin 500 Mg Tabs (Methocarbamol) .Marland Kitchen.. 1-2 tabs every 6-8 hours as needed muscle spasm 4)  Zoloft 100 Mg Tabs (Sertraline hcl) .Marland Kitchen.. 1 1/2 by mouth at bedtime 5)  Maxzide-25 37.5-25 Mg Tabs (Triamterene-hctz) .... One tablet by mouth daily for blood pressure 6)  Trazodone Hcl 50 Mg Tabs (Trazodone hcl) .... 3 tablets by mouth at bedtime as needed 7)  Benztropine Mesylate 1 Mg Tabs (Benztropine mesylate) .Marland Kitchen.. 1 by mouth at bedtime 8)  Thiothixene 10 Mg Caps (Thiothixene) .Marland Kitchen.. 1 by mouth at bedtime 9)  Antivert 12.5 Mg Tabs (Meclizine hcl) .... One pill  every 6 hours for dizziness 10)  Naproxen 500 Mg Tabs (Naproxen) .Marland Kitchen.. 1 tablet by mouth every 12 hours as needed for pain 11)  Ventolin Hfa 108 (90 Base) Mcg/act Aers (Albuterol sulfate) .... 2 inhalations every 6 hours for shortness of breath 12)  Metronidazole 500 Mg Tabs (Metronidazole) .... 4 tablets by mouth x 1 dose 13)  Advair Diskus 500-50 Mcg/dose Aepb (Fluticasone-salmeterol) .... Take 1 puff two times a day 14)  Helidac Misc (Metronid-tetracyc-bis subsal) .Marland Kitchen.. 1 dose by mouth 4 times a day for 2 weeks 15)  Cortisporin 3.5-10000-1 Soln (Neomycin-polymyxin-hc) .... 4 gtts in left ear three times a day for 7 days 16)  Revia 50 Mg Tabs (Naltrexone hcl) .... Take 1 tablet by mouth once a day (per [facility]) 17)  Miralax Powd (Polyethylene glycol 3350) .... Dissolve one capful in 8 oz of water and drink once per day as needed for constipation

## 2010-07-16 NOTE — Progress Notes (Signed)
Summary: RETURNING YOUR CALL  Phone Note Call from Patient   Caller: Patient Summary of Call: SHE NEES TO TALK TO YOU SHE IS PAIN. SHE CALL YOU 4 TIMES. PLEASE CALL HER AT [phone] Initial call taken by: Domenic Polite,  March 26, 2010 10:26 AM  Follow-up for Phone Call        pt has two phone notes open Follow-up by: [name],  March 26, 2010 3:06 PM

## 2010-07-16 NOTE — Assessment & Plan Note (Signed)
Summary: Right knee pain, having trouble walking // tl   Vital Signs:  Patient profile:   45 year old female Temp:     97.8 degrees F oral Pulse rate:   89 / minute Pulse rhythm:   regular Resp:     24 per minute BP sitting:   137 / 92  (left arm) Cuff size:   large CC: right inner knee pain x3 weeks Is Patient Diabetic? No Pain Assessment Patient in pain? yes     Location: right knee Intensity: 9 Type: throbbing Onset of pain  Sudden  Does patient need assistance? Functional Status Self care Ambulation Impaired:Risk for fall Comments Came in with cane and knee brace applied.  States medial knee pain due to "knot".   Primary Care Provider:  Tereso Newcomer PA-C  CC:  right inner knee pain x3 weeks.  History of Present Illness: Pain is around knee-cap medial right leg is very sore and has a "knot" on it, under the skin, having trouble walking and is having trouble with balance, can't put pressure on it.  Been hurting for about three weeks.  Pain scale = 9.  Topical pain relievers ineffective, using a knee brace but also not effective for pain.  Has not put any ice on it, does not recall any trauma.  Denies redness, no bruising, swollen around knot.  Throbs more with elevation of leg.   As above per nurse. Patiet comes in today with right knee pain x 3 weeks.  She denies injury.  She states she awoke one day with pain.  She comes in wearing a store bought brace.  Notes a little medial swelling.  Walking makes pain worse.  Has to stop often due to pain.  When she does active flexion, she has pain.  Points to entire anterior knee.  Notes what sounds like locking last week.  Notes some instability.  Has not fallen.  Has not taken any medications other than using Crista Elliot.  She then admits using ibuprofen 200mg  without relief.  Was told that she could no longer take muscle relaxers due to her appearances in drug court. . .apparently had a + UDS come back.    Problems Prior to Update: 1)   Knee Pain, Right  (ICD-719.46) 2)  Amenorrhea, Secondary  (ICD-626.0) 3)  Herpes Zoster  (ICD-053.9) 4)  Low Back Pain  (ICD-724.2) 5)  Ear Pain, Left  (ICD-388.70) 6)  Helicobacter Pylori Infection  (ICD-041.86) 7)  Dyspnea  (ICD-786.05) 8)  Schizophrenia  (ICD-295.90) 9)  Urinalysis, Abnormal  (ICD-791.9) 10)  Chest Pain Unspecified  (ICD-786.50) 11)  Family History of Cad Female 1st Degree Relative <60  (ICD-V16.49) 12)  Vaginal Trichomoniasis  (ICD-131.01) 13)  Dizziness  (ICD-780.4) 14)  Joint Effusion, Left Knee  (ICD-719.06) 15)  Otogenic Pain  (ICD-388.71) 16)  Insomnia, Chronic  (ICD-307.42) 17)  Depression  (ICD-311) 18)  Adnexal Mass, Left  (ICD-789.39) 19)  Examination, Routine Medical  (ICD-V70.0) 20)  Unspecified Breast Screening  (ICD-V76.10) 21)  Otitis Media, Left  (ICD-382.9) 22)  Constipation  (ICD-564.00) 23)  Gerd  (ICD-530.81) 24)  Back Pain, Lumbar  (ICD-724.2) 25)  Hypertension  (ICD-401.9) 26)  Abuse, Alcohol, 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 Penicillin  Past History:  Past Medical History: Last updated: 10/01/2009 Current Problems:  DYSLIPIDEMIA (ICD-272.4) h/o ANEMIA NOS (ICD-285.9) Hypertension h/o  GESTATIONAL DIABETES (ICD-648.80) Dyspnea   a.  myoview in May 2010 neg for ischemia   b.  CXR with chronic bronchitic changes   c.  CT neg for pul emboli May 2010   d.  PFTs pending in 04/2009 . . . PFTs inconclusive due to poor technique (05/08/2009) Low back pain   a.  xrays ok except for evidence of spasm 03/07/2009   b.  patient failed PT and d/c'd   c.  MRI ordered 05/01/2009 . . . 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  normal UGI 02/2009   b.  EGD with Dr. Charlott Rakes done 08/2009; nothing significant found Constipation   a.  normal  colonoscopy in 01/2009   b.  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  Physical Exam  General:  alert, well-developed, and well-nourished.   Head:  normocephalic and atraumatic.   Msk:  walks in with cane  hops on leg with antalgic gait to exam table wearing brace on knee  right knee: ? slight effusion very tender to ligt palpation over patella and medial joint space patient resistant to passive ROM due to pain no crepitus neg McMurray's neg ant drawer   Pulses:  DP and PT on right 2+ Neurologic:  alert & oriented X3 and cranial nerves II-XII intact.   +tardive dyskinesia  Skin:  warm and dry Psych:  normally interactive and good eye contact.     Impression & Recommendations:  Problem # 1:  KNEE PAIN, RIGHT (ICD-719.46)  etiology not clear no reported injury exam ok except for poss effusion and degree of pain with ambulation d/c naproxen (not taking this) diclofenac x 5 days, then as needed RICE check xray  close f/u with me in 2 weeks; if no better set up MRI if better, send to PT   The following medications were removed from the medication list:    Naproxen 500 Mg Tabs (Naproxen) .Marland Kitchen... 1 tablet by mouth every 12 hours as needed for pain Her updated medication list for this problem includes:    Robaxin 500 Mg Tabs (Methocarbamol) .Marland Kitchen... 1-2 tabs every 6-8 hours as needed muscle spasm    Hydrocodone-acetaminophen 5-500 Mg Tabs (Hydrocodone-acetaminophen) .Marland Kitchen... Take one by mouth every 6 hours as needed for pain (dr. lawyer  prescribed 4.4.2011)    Diclofenac Sodium 75 Mg Tbec (Diclofenac sodium) .Marland Kitchen... Take 1 tablet by mouth two times a day with food as needed for pain  Orders: Diagnostic X-Ray/Fluoroscopy (Diagnostic X-Ray/Flu)  Complete Medication List: 1)  Potassium Chloride Crys Cr 20 Meq Cr-tabs (Potassium chloride crys cr) .... One tablet by mouth daily for potassium 2)  Protonix 40 Mg Tbec (Pantoprazole sodium) .... Take 1 tablet by mouth two times a day 3)  Robaxin 500 Mg Tabs (Methocarbamol) .Marland Kitchen.. 1-2 tabs every 6-8 hours as needed muscle spasm 4)  Zoloft 100 Mg Tabs (Sertraline hcl) .... 2  by mouth at bedtime 5)  Maxzide-25 37.5-25 Mg Tabs (Triamterene-hctz) .... One tablet by mouth daily for blood pressure 6)  Benztropine Mesylate 1  Mg Tabs (Benztropine mesylate) .... Take 1 tablet by mouth two times a day 7)  Ventolin Hfa 108 (90 Base) Mcg/act Aers (Albuterol sulfate) .... 2 inhalations every 6 hours for shortness of breath 8)  Advair Diskus 500-50 Mcg/dose Aepb (Fluticasone-salmeterol) .... Take 1 puff two times a day 9)  Revia 50 Mg Tabs (Naltrexone hcl) .... Take 1 tablet by mouth once a day (per [facility]) 10)  Miralax Powd (Polyethylene glycol 3350) .... Dissolve one capful in 8 oz of water and drink once per day as needed for constipation 11)  Seroquel Xr 200 Mg Xr24h-tab (Quetiapine fumarate) .... Take 2 tabs at bedtime 12)  One A Day Multivitamin For Women  .... Once daily 13)  Hydrocodone-acetaminophen 5-500 Mg Tabs (Hydrocodone-acetaminophen) .... Take one by mouth every 6 hours as needed for pain (dr. lawyer prescribed 4.4.2011) 14)  Diclofenac Sodium 75 Mg Tbec (Diclofenac sodium) .... Take 1 tablet by mouth two times a day with food as needed for pain  Patient Instructions: 1)  Take Diclofenac 75 mg two times a day with food for 5 days.  Take even if pain is gone.  After 5 days, take two times a day with food as needed for pain. 2)  Do not take ibuprofen, aleve, naprosyn, motrin with  Diclofenac.  They are in the same class of medicines. 3)  You can take Tylenol in between as needed for breakthrough pain.  You can take Tylenol 500 mg 1-2 tabs every 6 hours.  Do not take more than 2 tablets every 6 hours in one day. 4)  Wear your brace daily. 5)  Try to continue to walk.  Try to walk some each day.  Just don't do any jumping or twisting with your knee until it feels better. 6)  Apply ice for 15 minutes and alternate with heat several times a day. 7)  Keep leg elevated when not walking or on your feet. 8)  Remember:  RICE (rest, ice, compression, elevation). 9)  Schedule follow up with Maleigh Bagot in 2 weeks for knee pain. 10)  Go ahead and get the xray done. Prescriptions: DICLOFENAC SODIUM 75 MG TBEC (DICLOFENAC SODIUM) Take 1 tablet by mouth two times a day with food as needed for pain  #60 x 1   Entered and Authorized by:   Tereso Newcomer PA-C   Signed by:   Tereso Newcomer PA-C on 12/18/2009   Method used:   Print then Give to Patient   RxID:   [id number]

## 2010-07-16 NOTE — Assessment & Plan Note (Signed)
Summary: nurse visit/gk  Nurse Visit   Vital Signs:  Patient profile:   45 year old female Pulse rate:   77 / minute BP sitting:   120 / 79  (left arm) Cuff size:   large  Vitals Entered By: [name] (Oct 30, 2009 11:23 AM)  Allergies: 1)  ! Penicillin  Appended Document: nurse visit/gk

## 2010-07-16 NOTE — Letter (Signed)
Summary: TEST ORDER FORM//MRI WITHOUT CONTRAST//BACK PAIN//APPT DATE & TI  TEST ORDER FORM//MRI WITHOUT CONTRAST//BACK PAIN//APPT DATE & TIME   Imported By: Arta Bruce 07/27/2009 11:42:43  _____________________________________________________________________  External Attachment:    Type:   Image     Comment:   External Document

## 2010-07-16 NOTE — Letter (Signed)
Summary: PFT REPORT  PFT REPORT   Imported By: Arta Bruce 07/24/2009 11:36:39  _____________________________________________________________________  External Attachment:    Type:   Image     Comment:   External Document

## 2010-07-16 NOTE — Progress Notes (Signed)
  Phone Note Outgoing Call   Summary of Call: Rec'd request for refill on Trazodone. This med should be taken as needed for sleep. She has rec'd refills q month for the last several mos. She should not be taking this medicine every night. I will need to see her again and discuss this issue before I refill trazodone. Initial call taken by: Tereso Newcomer PA-C,  August 15, 2009 1:38 PM  Follow-up for Phone Call        Left message on answering machine for pt to call back...Marland KitchenMarland KitchenArmenia Shannon  August 16, 2009 11:24 AM   pt says she does have like she need it every night and its part of her drug court program.... will make appt Follow-up by: [name],  August 16, 2009 4:00 PM

## 2010-07-16 NOTE — Assessment & Plan Note (Signed)
Summary: discuss mri and injections//cns   Vital Signs:  Patient profile:   45 year old female Height:      65 inches Weight:      194 pounds BMI:     32.40 Temp:     97.8 degrees F oral Pulse rate:   76 / minute Pulse rhythm:   regular Resp:     18 per minute BP sitting:   120 / 72  (left arm) Cuff size:   large  Vitals Entered By: [name] (July 10, 2009 10:03 AM) CC: here to discuss about injections for back...Marland KitchenMarland Kitchen pt had a hang nail and wants to make sure finger is not infected.. Is Patient Diabetic? No  Does patient need assistance? Functional Status Self care Ambulation Normal   CC:  here to discuss about injections for back...Marland KitchenMarland Kitchen pt had a hang nail and wants to make sure finger is not infected...  History of Present Illness: Here for f/u on MRI. Reviewed MRI with [facility].  Patient may benefit from [facility].  She is in office to discuss. Says her back pain seems to be getting worse.  Still notes some pain down her right leg.  No loss of bowel or bladder function.  No fevers or chills.  She takes naproxen and ibuprofen with relief.    She has a h/o schizophrenia and is followed at the [facility].  She is somewhat of a difficult historian.    She continues to note DOE.  Advair has seemed to help some.  She still notes a cough and wheezing.  Her PFTs were inconclusive.  She has used drugs in the past and admits to smoking cocaine and does admit to exposure to 2nd hand smoke.  She had a stress myoview with normal EF and no perfusion defects in May 2010.  She has had bronchitic changes on CXR in the past.  She does have a problem with dysphagia.  She still has not gotten in to see Dr. Corinda Gubler for EGD.  Her dysphagia seems to be getting worse.  She had an UGI in 02/2009 that was neg.  She went to the ED recently for a paronychia.  It was I+D.  She would like me to look at it.  No fevers or chills.  No pustular drainage.  Problems Prior to Update: 1)  Low Back Pain  (ICD-724.2) 2)  Ear  Pain, Left  (ICD-388.70) 3)  Helicobacter Pylori Infection  (ICD-041.86) 4)  Dyspnea  (ICD-786.05) 5)  Schizophrenia  (ICD-295.90) 6)  Urinalysis, Abnormal  (ICD-791.9) 7)  Chest Pain Unspecified  (ICD-786.50) 8)  Family History of Cad Female 1st Degree Relative <60  (ICD-V16.49) 9)  Vaginal Trichomoniasis  (ICD-131.01) 10)  Dizziness  (ICD-780.4) 11)  Joint Effusion, Left Knee  (ICD-719.06) 12)  Otogenic Pain  (ICD-388.71) 13)  Insomnia, Chronic  (ICD-307.42) 14)  Depression  (ICD-311) 15)  Adnexal Mass, Left  (ICD-789.39) 16)  Examination, Routine Medical  (ICD-V70.0) 17)  Unspecified Breast Screening  (ICD-V76.10) 18)  Otitis Media, Left  (ICD-382.9) 19)  Constipation  (ICD-564.00) 20)  Gerd  (ICD-530.81) 21)  Back Pain, Lumbar  (ICD-724.2) 22)  Hypertension  (ICD-401.9) 23)  Abuse, Alcohol, 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 One Tablet By Mouth Daily For Potassium 2)  Protonix 40 Mg  Tbec (Pantoprazole Sodium) .... Take 1 Tablet By Mouth Two Times A Day 3)  Robaxin 500 Mg Tabs (Methocarbamol) .Marland Kitchen.. 1-2 Tabs Every 6-8 Hours As Needed Muscle Spasm 4)  Zoloft 100 Mg Tabs (Sertraline Hcl) .Marland Kitchen.. 1 1/2 By Mouth At Bedtime 5)  Maxzide-25 37.5-25 Mg Tabs (Triamterene-Hctz) .... One Tablet By Mouth Daily For Blood Pressure 6)  Trazodone Hcl 50 Mg Tabs (Trazodone Hcl) .... 3 Tablets By Mouth At Bedtime As Needed 7)  Benztropine Mesylate 1 Mg Tabs (Benztropine Mesylate) .Marland Kitchen.. 1 By Mouth At Bedtime 8)  Thiothixene 10 Mg Caps (Thiothixene) .Marland Kitchen.. 1 By Mouth At Bedtime 9)  Antivert 12.5 Mg Tabs (Meclizine Hcl) .... One Pill Every 6 Hours For Dizziness 10)  Naproxen 500 Mg Tabs (Naproxen) .Marland Kitchen.. 1 Tablet By Mouth Every 12 Hours As Needed For Pain 11)  Ventolin Hfa 108 (90 Base)  Mcg/act Aers (Albuterol Sulfate) .... 2 Inhalations Every 6 Hours For Shortness of Breath 12)  Metronidazole 500 Mg Tabs (Metronidazole) .... 4 Tablets By Mouth X 1 Dose 13)  Advair Diskus 500-50 Mcg/dose Aepb (Fluticasone-Salmeterol) .... Take 1 Puff Two Times A Day 14)  Helidac  Misc (Metronid-Tetracyc-Bis Subsal) .Marland Kitchen.. 1 Dose By Mouth 4 Times A Day For 2 Weeks 15)  Cortisporin 3.5-10000-1 Soln (Neomycin-Polymyxin-Hc) .... 4 Gtts in Left Ear Three Times A Day For 7 Days  Allergies (verified): 1)  ! Penicillin  Past History:  Past Medical History: Current Problems:  DYSLIPIDEMIA (ICD-272.4) h/o ANEMIA NOS (ICD-285.9) Hypertension h/o GESTATIONAL DIABETES (ICD-648.80) Dyspnea   a.  myoview in May 2010 neg for ischemia   b.  CXR with chronic bronchitic changes   c.  CT neg for pul emboli May 2010   d.  PFTs pending in 04/2009 . . . PFTs inconclusive due to poor technique (05/08/2009) Low back pain   a.  xrays ok except for evidence of spasm 03/07/2009   b.  patient failed PT and d/c'd   c.  MRI ordered 05/01/2009 . . . 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  normal UGI 02/2009 Constipation   a.  normal colonoscopy in 01/2009   b.  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  recovering alcoholic; on Re[facility] per [facility] Drug use-yes...cocaine in past  Physical Exam  General:  alert, well-developed, and well-nourished.   Head:  normocephalic and atraumatic.   Lungs:  normal breath sounds, no crackles, and no wheezes.   Heart:  normal rate and regular  rhythm.   Extremities:  no edema Neurologic:  alert & oriented X3, cranial nerves II-XII intact, strength normal in all extremities, and DTRs symmetrical and normal.   Psych:  good eye contact and hyperactive; patient constantly fidgeting.   Impression & Recommendations:  Problem # 1:  UNSPECIFIED BREAST SCREENING (ICD-V76.10)  due for mammo this month  Orders: Mammogram (Screening) (Mammo)  Problem # 2:  LOW BACK PAIN (ICD-724.2)  reviewed MRI with patient  will refer to [facility] for poss ESI of lumbosacral spine  Her updated medication list for this problem includes:    Robaxin 500 Mg Tabs (Methocarbamol) .Marland Kitchen... 1-2 tabs  every 6-8 hours as needed muscle spasm    Naproxen 500 Mg Tabs (Naproxen) .Marland Kitchen... 1 tablet by mouth every 12 hours as needed for pain  Orders: Radiology Referral (Radiology)  Problem # 3:  HYPERTENSION (ICD-401.9)  controlled check labs  Her updated medication list for this problem includes:    Maxzide-25 37.5-25 Mg Tabs (Triamterene-hctz) ..... One tablet by mouth daily for blood pressure  Orders: T-Comprehensive Metabolic Panel ([id number])  Problem # 4:  GERD (ICD-530.81)  refer to GI for dysphagia ? if this is related to her past reports of chest pain myoview negative in May 2010 UGI was ok in late 2010 Dr. Corinda Gubler had suggested poss getting EGD when he did her colo she is on dual PPI and has had tx for H. pylori  Her updated medication list for this problem includes:    Protonix 40 Mg Tbec (Pantoprazole sodium) .Marland Kitchen... Take 1 tablet by mouth two times a day    Helidac Misc (Metronid-tetracyc-bis subsal) .Marland Kitchen... 1 dose by mouth 4 times a day for 2 weeks  Orders: Gastroenterology Referral (GI)  Problem # 5:  SCHIZOPHRENIA (ICD-295.90) f/u with [facility]  Problem # 6:  DYSPNEA (ICD-786.05) still supect she has COPD cont current tx  Complete Medication List: 1)  Potassium Chloride Crys Cr 20 Meq Cr-tabs (Potassium chloride crys cr) .... One tablet by  mouth daily for potassium 2)  Protonix 40 Mg Tbec (Pantoprazole sodium) .... Take 1 tablet by mouth two times a day 3)  Robaxin 500 Mg Tabs (Methocarbamol) .Marland Kitchen.. 1-2 tabs every 6-8 hours as needed muscle spasm 4)  Zoloft 100 Mg Tabs (Sertraline hcl) .Marland Kitchen.. 1 1/2 by mouth at bedtime 5)  Maxzide-25 37.5-25 Mg Tabs (Triamterene-hctz) .... One tablet by mouth daily for blood pressure 6)  Trazodone Hcl 50 Mg Tabs (Trazodone hcl) .... 3 tablets by mouth at bedtime as needed 7)  Benztropine Mesylate 1 Mg Tabs (Benztropine mesylate) .Marland Kitchen.. 1 by mouth at bedtime 8)  Thiothixene 10 Mg Caps (Thiothixene) .Marland Kitchen.. 1 by mouth at bedtime 9)  Antivert 12.5 Mg Tabs (Meclizine hcl) .... One pill every 6 hours for dizziness 10)  Naproxen 500 Mg Tabs (Naproxen) .Marland Kitchen.. 1 tablet by mouth every 12 hours as needed for pain 11)  Ventolin Hfa 108 (90 Base) Mcg/act Aers (Albuterol sulfate) .... 2 inhalations every 6 hours for shortness of breath 12)  Metronidazole 500 Mg Tabs (Metronidazole) .... 4 tablets by mouth x 1 dose 13)  Advair Diskus 500-50 Mcg/dose Aepb (Fluticasone-salmeterol) .... Take 1 puff two times a day 14)  Helidac Misc (Metronid-tetracyc-bis subsal) .Marland Kitchen.. 1 dose by mouth 4 times a day for 2 weeks 15)  Cortisporin 3.5-10000-1 Soln (Neomycin-polymyxin-hc) .... 4 gtts in left ear three times a day for 7 days 16)  Revia 50 Mg Tabs (Naltrexone hcl) .... Take 1 tablet by mouth once a day (per [facility])  Patient Instructions: 1)  Do not take naproxen with ibuprofen. 2)  Please schedule a follow-up appointment in 3 months with Bastien Strawser for high blood pressure. 3)

## 2010-07-16 NOTE — Letter (Signed)
Summary: Lipid Letter  [facility]  [address] [place], [place] [address]   Phone: [phone]  Fax: [phone]    04/10/2010  Maureen Swanson [address] Shaune [place], [place]  [address]  Dear [name]:  We have carefully reviewed your last lipid profile from 04/08/2010 and the results are noted below with a summary of recommendations for lipid management.    Cholesterol:       313     Goal: less than 200   HDL "good" Cholesterol:   69     Goal: greater than 40   LDL "bad" Cholesterol:   Unable to Calculate   Triglycerides:       596     Goal: less than 150    Your cholesterol is VERY elevated. You should be notified by this office regarding the need to start medication.    You should also start a low fat low cholesterol diet. This means you should bake all your food instead of frying.  It is important to keep your cholesterol under good control because high cholesterol is a risk factor for heart attack and stroke.      Current Medications: 1)    Potassium Chloride Crys Cr 20 Meq Cr-tabs (Potassium chloride crys cr) .... One tablet by mouth daily for potassium 2)    Protonix 40 Mg Tbec (Pantoprazole sodium) .... Take 1 tablet by mouth once daily 3)    Robaxin 500 Mg Tabs (Methocarbamol) .Marland Kitchen.. 1-2 tabs every 6-8 hours as needed muscle spasm 4)    Zoloft 100 Mg Tabs (Sertraline hcl) .... 2  by mouth at bedtime 5)    Maxzide-25 37.5-25 Mg Tabs (Triamterene-hctz) .... One tablet by mouth daily for blood pressure 6)    Benztropine Mesylate 1 Mg Tabs (Benztropine mesylate) .... Take 1 tablet by mouth two times a day 7)    Ventolin Hfa 108 (90 Base) Mcg/act Aers (Albuterol sulfate) .... 2 inhalations every 6 hours for shortness of breath 8)    Advair Diskus 500-50 Mcg/dose Aepb (Fluticasone-salmeterol) .... Take 1 puff two times a day 9)    Revia 50 Mg Tabs (Naltrexone hcl) .... Take 1 tablet by mouth once a day (per [facility]) 10)    Miralax  Powd  (Polyethylene glycol 3350) .... Dissolve one capful in 8 oz of water and drink once per day as needed for constipation 11)    Seroquel Xr 200 Mg Xr24h-tab (Quetiapine fumarate) .... Take 2 tabs at bedtime 12)    One A Day Multivitamin For Women  .... Once daily 13)    Nabumetone 500 Mg Tabs (Nabumetone) .Marland Kitchen.. 1-2 tabs by mouth every 12 hours as needed for pain; please take with food 14)    Capsaicin 0.025 % Crea (Capsaicin) .... Apply three times a day to four times a day as needed for pain 15)    Vistaril 25 Mg Caps (Hydroxyzine pamoate) .... Take 1 cap by mouth 1 hour before mri.  may repeat x 1 if still having symptoms of claustrophobia before test  If you have any questions, please call. We appreciate being able to work with you.   Sincerely,    Lehman Prom, FNP [facility]

## 2010-07-16 NOTE — Progress Notes (Signed)
Summary: Requesting a note to unable her to continue working  Phone Note Call from Patient Call back at [employer] [phone]   Summary of Call: [name] is requesting the provider write down a statement or at least a note that states pt is unable to work because of back pain (arthritis).  Pt needs this the note by Monday next week.   Alben Spittle PA-c  Initial call taken by: Manon Hilding,  Nov 13, 2009 3:00 PM  Follow-up for Phone Call        pt is aware provider is out... forward to provider... [name]  Nov 13, 2009 4:53 PM   Additional Follow-up for Phone Call Additional follow up Details #1::        She needs to know that the note  will not be ready by next Monday.   Additional Follow-up by: Julieanne Manson MD,  Nov 13, 2009 6:47 PM    Additional Follow-up for Phone Call Additional follow up Details #2::    pt is aware.. [name]  November 14, 2009 9:01 AM   This is news to me.  I will have to see her for an appt to discuss her back pain and why she cannot work. Tereso Newcomer PA-C  November 18, 2009 1:50 PM   Left message on answering machine for pt to call back.Marland KitchenMarland KitchenArmenia Shannon  November 19, 2009 9:46 AM   pt is aware and has appt on june 16... [name]  November 19, 2009 12:10 PM

## 2010-07-16 NOTE — Letter (Signed)
Summary: Out of Work  [facility]  [address] [place], [place] [address]   Phone: [phone]  Fax: [phone]    March 18, 2010   Employee:  Maureen Swanson    To Whom It May Concern:   For Medical reasons, please excuse the above named patient from work that involves prolonged standing, running, jumping, squatting or lower extremity twisting for the following dates:  Start:   March 01, 2010  End:   Cannot determine at this time.  She will be referred to a specialist.  If you need additional information, please feel free to contact our office.         Sincerely,    Tereso Newcomer PA-C

## 2010-07-16 NOTE — Letter (Signed)
Summary: REFERRAL/RADIOLOGY//APPT DATE & TIME  REFERRAL/RADIOLOGY//APPT DATE & TIME   Imported By: Arta Bruce 08/28/2009 12:27:03  _____________________________________________________________________  External Attachment:    Type:   Image     Comment:   External Document

## 2010-07-16 NOTE — Progress Notes (Signed)
Summary: Right knee pain, having trouble walking  Phone Note Call from Patient Call back at Home Phone [phone]   Caller: Patient Reason for Call: Acute Illness Summary of Call: Patient has right leg pain x 3 weeks...could not determine cause of pain. Has appt with Lorin Picket on 7/20. Initial call taken by: Janace Aris,  December 18, 2009 11:21 AM  Follow-up for Phone Call        Pain is around knee-cap medial right leg is very sore and has a "knot" on it, under the skin, having trouble walking and is having trouble with balance, can't put pressure on it.  Been hurting for about three weeks.  Pain scale = 9.  Topical pain relievers ineffective, using a knee brace but also not effective for pain.  Has not put any ice on it, does not recall any trauma.  Denies redness, no bruising, swollen around knot.  Throbs more with elevation of leg.  Coming in this afternoon to see S. Alben Spittle.   Follow-up by: Dutch Quint RN,  December 18, 2009 11:54 AM  Additional Follow-up for Phone Call Additional follow up Details #1::        patient seen in office

## 2010-07-16 NOTE — Progress Notes (Signed)
Summary: NEEDS NOTE FOR COURT  Phone Note Call from Patient Call back at Home Phone [phone]   Summary of Call: Prabhav Faulkenberry PT. [facility] [place] CALLED AND SAYS GTHAT SHE NEEDS A NOTE FROM Jonna Dittrich STATING ABOUT HER KNEE AND SHE NEEDS TO TAKE THIS TO DRUG COURT. IT NEEDS TO STATE WHY SHE NEEDS TO WEAR A KNEE BRACE. SHE CANT COME IN BECAUSE HER CARD EXPIRED AND HER ELIG IS NOT UNTIL THE END OF THIS MONTH.  SHE WILL NEED IT BY WEDNESDAY NEXT WEEK. Initial call taken by: Leodis Rains,  February 14, 2010 4:06 PM  Follow-up for Phone Call        forward to provider Follow-up by: [name],  February 14, 2010 4:59 PM  Additional Follow-up for Phone Call Additional follow up Details #1::        in basket Additional Follow-up by: Tereso Newcomer PA-C,  February 15, 2010 11:49 AM    Additional Follow-up for Phone Call Additional follow up Details #2::    pt was unavailable.... [name]  February 15, 2010 12:08 PM   pt is aware.... [name]  February 15, 2010 12:54 PM

## 2010-07-16 NOTE — Progress Notes (Signed)
Summary: WOULD LIKE HER MRI RESULTS  Phone Note Call from Patient Call back at Home Phone [phone]   Reason for Call: Lab or Test Results Summary of Call: [name] CALLED TO SEE IF HER MRI RESULTS ARE IN AND WOULD LIKE FOR SOMEONE TO CALL HER ABOUT THEM. Initial call taken by: Leodis Rains,  March 08, 2010 8:51 AM  Follow-up for Phone Call        Results are unsigned.  Sent to Maureen Swanson.  Dutch Quint RN  March 08, 2010 11:34 AM   Additional Follow-up for Phone Call Additional follow up Details #1::        She has a torn meniscus. I am [facility] see if they can see her over there.  I have sent one of the doc's a flag. I was going to check with them before getting in touch with her to let her know what to do next. It will be quicker and easier to get her in there.  SO, I am checking with them first. We will be in touch with her once I hear something back.  Additional Follow-up by: Tereso Newcomer PA-C,  March 08, 2010 12:26 PM    Additional Follow-up for Phone Call Additional follow up Details #2::    Left message with female for pt. to return call.  Dutch Quint RN  March 08, 2010 3:38 PM  Advised of results and provider's response.  Discussed caring for knee to prevent further damage, such as resting the knee when possible, elevating and supporting the knee when resting, applying ice for swelling and using moist heat for comfort.  Verbalized understanding.  Follow-up by: Dutch Quint RN,  March 08, 2010 3:54 PM

## 2010-07-16 NOTE — Miscellaneous (Signed)
Summary: Trazodone Update   Discussed case with Lillia Abed, RN at [facility]. They were not aware that we were giving her trazadone. Patient is currently inpatient in [facility]. Lillia Abed agreed to have psychiatry handle prescribing meds for her sleep.  She is also on seroquel for sleep.   Clinical Lists Changes  Medications: Changed medication from TRAZODONE HCL 50 MG TABS (TRAZODONE HCL) 3 tablets by mouth at bedtime as needed to TRAZODONE HCL 50 MG TABS (TRAZODONE HCL) 3 tablets by mouth at bedtime as needed  ***PER [facility] - PATIENT WILL NO LONGER GET FROM THIS OFFICE***

## 2010-07-16 NOTE — Letter (Signed)
Summary: [facility]   Imported By: Arta Bruce 05/01/2010 14:22:35  _____________________________________________________________________  External Attachment:    Type:   Image     Comment:   External Document

## 2010-07-16 NOTE — Letter (Signed)
Summary: REFERRAL/GASTROENTEROLOGY//GERD  REFERRAL/GASTROENTEROLOGY//GERD   Imported By: Arta Bruce 07/09/2009 15:44:59  _____________________________________________________________________  External Attachment:    Type:   Image     Comment:   External Document

## 2010-07-16 NOTE — Letter (Signed)
Summary: REFERRAL /RADIOLOGY  REFERRAL /RADIOLOGY   Imported By: Arta Bruce 09/06/2009 11:08:20  _____________________________________________________________________  External Attachment:    Type:   Image     Comment:   External Document

## 2010-07-16 NOTE — Progress Notes (Signed)
Summary: Query:  change meds?  Phone Note Outgoing Call   Summary of Call: Do you want to refill her diclofenac instead of nabumetone?  I have a refill request for diclofenac.   Initial call taken by: Dutch Quint RN,  March 26, 2010 3:17 PM  Follow-up for Phone Call        no  stay on nabumetone only Follow-up by: Tereso Newcomer PA-C,  March 26, 2010 4:30 PM  Additional Follow-up for Phone Call Additional follow up Details #1::        Noted.  Dutch Quint RN  March 26, 2010 5:17 PM

## 2010-07-16 NOTE — Progress Notes (Signed)
  Phone Note From Pharmacy   Caller: [facility] - Pharmac Reason for Call: Needs renewal Details for Reason: Zoloft  Summary of Call: Patient on Zoloft from [facility].  Gets med from [employer].   Please check with pharmacy if her psychiatrist is refilling or not.  Initial call taken by: Brynda Rim,  January 10, 2010 5:30 AM  Follow-up for Phone Call        spoke with [facility] and pt has not had med since april 28 with no refills Follow-up by: [name],  January 11, 2010 9:53 AM  Additional Follow-up for Phone Call Additional follow up Details #1::        she needs to contact her psychiatrist for refills or have them send documentation to me that she is supposed to take so she can get a refill at our pharmacy Additional Follow-up by: Brynda Rim,  January 11, 2010 1:15 PM    Additional Follow-up for Phone Call Additional follow up Details #2::    pt is aware Follow-up by: [name],  January 11, 2010 4:52 PM

## 2010-07-16 NOTE — Letter (Signed)
Summary: GI NOTES  GI NOTES   Imported By: Arta Bruce 10/19/2009 14:20:36  _____________________________________________________________________  External Attachment:    Type:   Image     Comment:   External Document

## 2010-07-16 NOTE — Letter (Signed)
Summary: COLONSCOPY 2010//REPEAT 2015  COLONSCOPY 2010//REPEAT 2015   Imported By: Arta Bruce 07/10/2009 11:38:31  _____________________________________________________________________  External Attachment:    Type:   Image     Comment:   External Document

## 2010-07-16 NOTE — Letter (Signed)
Summary: COMMUNICABLE DISEASE REPORT  COMMUNICABLE DISEASE REPORT   Imported By: Arta Bruce 04/02/2010 13:00:45  _____________________________________________________________________  External Attachment:    Type:   Image     Comment:   External Document

## 2010-07-18 ENCOUNTER — Encounter (INDEPENDENT_AMBULATORY_CARE_PROVIDER_SITE_OTHER): Payer: Self-pay | Admitting: Internal Medicine

## 2010-07-18 NOTE — Assessment & Plan Note (Signed)
Summary: 2236 pt/3 month fu on bp///kt   Vital Signs:  Patient profile:   45 year old female Menstrual status:  perimenopausal Weight:      202.44 pounds Temp:     98.1 degrees F oral Pulse rate:   104 / minute Pulse rhythm:   regular Resp:     20 per minute BP sitting:   118 / 92  (left arm) Cuff size:   regular  Vitals Entered By: Hale Drone CMA (June 21, 2010 2:30 PM) CC: 3 month f/u on BP from 03/18/10. Complaining of right knee pain. Had right knee surgery in 12/11. Also having jaw pain x2 weeks. Can not open. It seems to be at a locked position.  Is Patient Diabetic? No Pain Assessment Patient in pain? yes     Location: jaw Intensity: 9 Type: aching Onset of pain  Constant  Does patient need assistance? Functional Status Self care Ambulation Normal LMP - Character: normal Menarche (age onset years): 15   Menses interval (days): 21 Menstrual flow (days): 3 Menstrual Status perimenopausal Last PAP Result NEGATIVE FOR INTRAEPITHELIAL LESIONS OR MALIGNANCY. BENIGN REACTIVE/REPARATIVE CHANGES.   Primary Care Provider:  Tereso Newcomer PA-C  CC:  3 month f/u on BP from 03/18/10. Complaining of right knee pain. Had right knee surgery in 12/11. Also having jaw pain x2 weeks. Can not open. It seems to be at a locked position. Marland Kitchen  History of Present Illness: 1.  Anticoagulation following arthroscopic knee surgery in mid November with Dr. Madelon Lips.  Still on coumadin, though was to stop 4 weeks after surgery.  Is walking regularly.  States does not sit for long periods of time.  Not a good historian, but does not appear she has ever suffered from a DVT or PE in chart.  No Warfarin in her current med list.  Still having some discomfort of knee.  Feels like it catches.  Is better than before, however.  Was holding NSAIDS until off Warfarin.   2.  Hyperlipidemia: Very high cholesterol and triglycerides in October.  On Pravastatin.  Tolerating fine.  Not fasting today.  3.  Jaw  locked on her last night--left side.     Current Medications (verified): 1)  Potassium Chloride Crys Cr 20 Meq Cr-Tabs (Potassium Chloride Crys Cr) .... One Tablet By Mouth Daily For Potassium 2)  Protonix 40 Mg Tbec (Pantoprazole Sodium) .... Take 1 Tablet By Mouth Once Daily 3)  Robaxin 500 Mg Tabs (Methocarbamol) .Marland Kitchen.. 1-2 Tabs Every 6-8 Hours As Needed Muscle Spasm 4)  Zoloft 100 Mg Tabs (Sertraline Hcl) .... 2  By Mouth At Bedtime 5)  Maxzide-25 37.5-25 Mg Tabs (Triamterene-Hctz) .... One Tablet By Mouth Daily For Blood Pressure 6)  Benztropine Mesylate 1 Mg Tabs (Benztropine Mesylate) .... Take 1 Tablet By Mouth Two Times A Day 7)  Ventolin Hfa 108 (90 Base) Mcg/act Aers (Albuterol Sulfate) .... 2 Inhalations Every 6 Hours For Shortness of Breath 8)  Advair Diskus 500-50 Mcg/dose Aepb (Fluticasone-Salmeterol) .... Take 1 Puff Two Times A Day 9)  Revia 50 Mg Tabs (Naltrexone Hcl) .... Take 1 Tablet By Mouth Once A Day (Per [facility]) 10)  Miralax  Powd (Polyethylene Glycol 3350) .... Dissolve One Capful in 8 Oz of Water and Drink Once Per Day As Needed For Constipation 11)  Seroquel Xr 200 Mg Xr24h-Tab (Quetiapine Fumarate) .... Take 2 Tabs At Bedtime 12)  One A Day Multivitamin For Women .... Once Daily 13)  Nabumetone 500 Mg Tabs (Nabumetone) .Marland KitchenMarland KitchenMarland Kitchen  1-2 Tabs By Mouth Every 12 Hours As Needed For Pain; Please Take With Food 14)  Capsaicin 0.025 % Crea (Capsaicin) .... Apply Three Times A Day To Four Times A Day As Needed For Pain 15)  Vistaril 25 Mg Caps (Hydroxyzine Pamoate) .... Take 1 Cap By Mouth 1 Hour Before Mri.  May Repeat X 1 If Still Having Symptoms of Claustrophobia Before Test 16)  Pravastatin Sodium 40 Mg Tabs (Pravastatin Sodium) .... One Tablet By Mouth Nightly For Cholesterol  Allergies (verified): 1)  ! Penicillin  Physical Exam  General:  NAD, very difficult historian.  Difficulty keeping attention at times Mouth:  Crossbite.  No crepitation or obvious pain at left jaw.   No swelling or erythema. Lungs:  Normal respiratory effort, chest expands symmetrically. Lungs are clear to auscultation, no crackles or wheezes. Heart:  Normal rate and regular rhythm. S1 and S2 normal without gallop, murmur, click, rub or other extra sounds.  Radial pulses normal and equal   Impression & Recommendations:  Problem # 1:  HYPERLIPIDEMIA (ICD-272.4) To return for fasting labs Her updated medication list for this problem includes:    Pravastatin Sodium 40 Mg Tabs (Pravastatin sodium) ..... One tablet by mouth nightly for cholesterol  Problem # 2:  MEDIAL MENISCUS TEAR, RIGHT (ICD-836.0) Stop Warfarin and may resume Nabumetone Orders: Physical Therapy Referral (PT)  Problem # 3:  JAW PAIN (ICD-526.9) Nabumetone.  No concerning findings  Complete Medication List: 1)  Potassium Chloride Crys Cr 20 Meq Cr-tabs (Potassium chloride crys cr) .... One tablet by mouth daily for potassium 2)  Protonix 40 Mg Tbec (Pantoprazole sodium) .... Take 1 tablet by mouth once daily 3)  Robaxin 500 Mg Tabs (Methocarbamol) .Marland Kitchen.. 1-2 tabs every 6-8 hours as needed muscle spasm 4)  Zoloft 100 Mg Tabs (Sertraline hcl) .... 2  by mouth at bedtime 5)  Maxzide-25 37.5-25 Mg Tabs (Triamterene-hctz) .... One tablet by mouth daily for blood pressure 6)  Benztropine Mesylate 1 Mg Tabs (Benztropine mesylate) .... Take 1 tablet by mouth two times a day 7)  Ventolin Hfa 108 (90 Base) Mcg/act Aers (Albuterol sulfate) .... 2 inhalations every 6 hours for shortness of breath 8)  Advair Diskus 500-50 Mcg/dose Aepb (Fluticasone-salmeterol) .... Take 1 puff two times a day 9)  Revia 50 Mg Tabs (Naltrexone hcl) .... Take 1 tablet by mouth once a day (per [facility]) 10)  Miralax Powd (Polyethylene glycol 3350) .... Dissolve one capful in 8 oz of water and drink once per day as needed for constipation 11)  Seroquel Xr 200 Mg Xr24h-tab (Quetiapine fumarate) .... Take 2 tabs at bedtime 12)  One A Day Multivitamin For  Women  .... Once daily 13)  Nabumetone 500 Mg Tabs (Nabumetone) .Marland Kitchen.. 1-2 tabs by mouth every 12 hours as needed for pain; please take with food 14)  Capsaicin 0.025 % Crea (Capsaicin) .... Apply three times a day to four times a day as needed for pain 15)  Vistaril 25 Mg Caps (Hydroxyzine pamoate) .... Take 1 cap by mouth 1 hour before mri.  may repeat x 1 if still having symptoms of claustrophobia before test 16)  Pravastatin Sodium 40 Mg Tabs (Pravastatin sodium) .... One tablet by mouth nightly for cholesterol  Patient Instructions: 1)  Follow up with Dr. Delrae Alfred in 4 months --knee  2)  Fasting lab visit in next 2 weeks:  FLP and liver profile 3)  STOP warfarin Prescriptions: NABUMETONE 500 MG TABS (NABUMETONE) 1-2 tabs by mouth every  12 hours as needed for pain; please take with food  #60 x 1   Entered and Authorized by:   Julieanne Manson MD   Signed by:   Julieanne Manson MD on 06/21/2010   Method used:   Faxed to ...       [facility] - Pharmac (retail)       [address] [place], [place]  [address]       Ph: [phone]       Fax: [phone]   RxID:   [id number]    Orders Added: 1)  Physical Therapy Referral [PT] 2)  Est. Patient Level III [66440]

## 2010-07-18 NOTE — Letter (Signed)
Summary: ANTICOAGULATION DOSING  ANTICOAGULATION DOSING   Imported By: Arta Bruce 05/27/2010 10:51:44  _____________________________________________________________________  External Attachment:    Type:   Image     Comment:   External Document

## 2010-07-18 NOTE — Letter (Signed)
Summary: ANTICOAGULATION DOSING  ANTICOAGULATION DOSING   Imported By: Arta Bruce 05/28/2010 15:25:48  _____________________________________________________________________  External Attachment:    Type:   Image     Comment:   External Document

## 2010-07-18 NOTE — Letter (Signed)
Summary: ANTICOAGULATION DOSING  ANTICOAGULATION DOSING   Imported By: Arta Bruce 05/27/2010 10:46:48  _____________________________________________________________________  External Attachment:    Type:   Image     Comment:   External Document

## 2010-07-18 NOTE — Assessment & Plan Note (Signed)
Summary: jaw pain///rje   Vital Signs:  Patient profile:   45 year old female Menstrual status:  perimenopausal Weight:      204 pounds Temp:     98.4 degrees F oral Pulse rate:   102 / minute Pulse rhythm:   regular Resp:     18 per minute BP sitting:   134 / 94  (left arm) Cuff size:   regular  Vitals Entered By: Hale Drone CMA (July 03, 2010 10:36 AM) CC: Here for jaw pain x2 weeks. Taking Nabumetone for pain.  Is Patient Diabetic? No Pain Assessment Patient in pain? yes     Location: jaw Intensity: 8 Type: throbbing Onset of pain  Constant  Does patient need assistance? Functional Status Self care Ambulation Normal   Primary Care Provider:  Tereso Newcomer PA-C  CC:  Here for jaw pain x2 weeks. Taking Nabumetone for pain. Marland Kitchen  History of Present Illness: 1.  Locked jaw:  Was seen recently and had just started with this upon awakening that day.  Was taking the Nabumetone, but did not get any improvement.  Feels she has swelling over her preauricular area since last seen.  Pt. now gives history that she was moving her mouth back and forth the night before she was last seen here and felt a pop in her left jaw, near ear and then developed pain.  Unable to open her mouth adequately--throbbing pain.  Difficulty eating with inability to open mouth adequately and with pain.  No fever, sore throat, no signficant or new tooth pain.  Has never had before.  2.  States she stopped coumadin.   Current Medications (verified): 1)  Potassium Chloride Crys Cr 20 Meq Cr-Tabs (Potassium Chloride Crys Cr) .... One Tablet By Mouth Daily For Potassium 2)  Protonix 40 Mg Tbec (Pantoprazole Sodium) .... Take 1 Tablet By Mouth Once Daily 3)  Robaxin 500 Mg Tabs (Methocarbamol) .Marland Kitchen.. 1-2 Tabs Every 6-8 Hours As Needed Muscle Spasm 4)  Zoloft 100 Mg Tabs (Sertraline Hcl) .... 2  By Mouth At Bedtime 5)  Maxzide-25 37.5-25 Mg Tabs (Triamterene-Hctz) .... One Tablet By Mouth Daily For Blood  Pressure 6)  Benztropine Mesylate 1 Mg Tabs (Benztropine Mesylate) .... Take 1 Tablet By Mouth Two Times A Day 7)  Ventolin Hfa 108 (90 Base) Mcg/act Aers (Albuterol Sulfate) .... 2 Inhalations Every 6 Hours For Shortness of Breath 8)  Advair Diskus 500-50 Mcg/dose Aepb (Fluticasone-Salmeterol) .... Take 1 Puff Two Times A Day 9)  Revia 50 Mg Tabs (Naltrexone Hcl) .... Take 1 Tablet By Mouth Once A Day (Per [facility]) 10)  Miralax  Powd (Polyethylene Glycol 3350) .... Dissolve One Capful in 8 Oz of Water and Drink Once Per Day As Needed For Constipation 11)  Seroquel Xr 200 Mg Xr24h-Tab (Quetiapine Fumarate) .... Take 2 Tabs At Bedtime 12)  One A Day Multivitamin For Women .... Once Daily 13)  Nabumetone 500 Mg Tabs (Nabumetone) .Marland Kitchen.. 1-2 Tabs By Mouth Every 12 Hours As Needed For Pain; Please Take With Food 14)  Capsaicin 0.025 % Crea (Capsaicin) .... Apply Three Times A Day To Four Times A Day As Needed For Pain 15)  Vistaril 25 Mg Caps (Hydroxyzine Pamoate) .... Take 1 Cap By Mouth 1 Hour Before Mri.  May Repeat X 1 If Still Having Symptoms of Claustrophobia Before Test 16)  Pravastatin Sodium 40 Mg Tabs (Pravastatin Sodium) .... One Tablet By Mouth Nightly For Cholesterol  Allergies (verified): 1)  ! Penicillin  Physical  Exam  General:  Constantly moving, NAD Ears:  Left TM and canal are clear. Tender when palpating TMJ, but no crepitation and no obvious swelling compared to right overlying the TMJ area.  NT over parotid duct along left cheek. Mouth:  No erythema, swelling or mass noted on pharyngeal exam or gingiva, mouth, buccal mucosa.  Tender all over along gingiva, and at where upper and lower gingiva meet in posterior left mouth, again but no erythema, swelling or fluctuance.     Impression & Recommendations:  Problem # 1:  JAW PAIN (ICD-526.9) Pt. very difficult to evaluate. History not consistent Will see what the xray shows before proceeding. Orders: Diagnostic X-Ray/Fluoroscopy  (Diagnostic X-Ray/Flu)  Complete Medication List: 1)  Potassium Chloride Crys Cr 20 Meq Cr-tabs (Potassium chloride crys cr) .... One tablet by mouth daily for potassium 2)  Protonix 40 Mg Tbec (Pantoprazole sodium) .... Take 1 tablet by mouth once daily 3)  Robaxin 500 Mg Tabs (Methocarbamol) .Marland Kitchen.. 1-2 tabs every 6-8 hours as needed muscle spasm 4)  Zoloft 100 Mg Tabs (Sertraline hcl) .... 2  by mouth at bedtime 5)  Maxzide-25 37.5-25 Mg Tabs (Triamterene-hctz) .... One tablet by mouth daily for blood pressure 6)  Benztropine Mesylate 1 Mg Tabs (Benztropine mesylate) .... Take 1 tablet by mouth two times a day 7)  Ventolin Hfa 108 (90 Base) Mcg/act Aers (Albuterol sulfate) .... 2 inhalations every 6 hours for shortness of breath 8)  Advair Diskus 500-50 Mcg/dose Aepb (Fluticasone-salmeterol) .... Take 1 puff two times a day 9)  Revia 50 Mg Tabs (Naltrexone hcl) .... Take 1 tablet by mouth once a day (per [facility]) 10)  Miralax Powd (Polyethylene glycol 3350) .... Dissolve one capful in 8 oz of water and drink once per day as needed for constipation 11)  Seroquel Xr 200 Mg Xr24h-tab (Quetiapine fumarate) .... Take 2 tabs at bedtime 12)  One A Day Multivitamin For Women  .... Once daily 13)  Nabumetone 500 Mg Tabs (Nabumetone) .Marland Kitchen.. 1-2 tabs by mouth every 12 hours as needed for pain; please take with food 14)  Capsaicin 0.025 % Crea (Capsaicin) .... Apply three times a day to four times a day as needed for pain 15)  Vistaril 25 Mg Caps (Hydroxyzine pamoate) .... Take 1 cap by mouth 1 hour before mri.  may repeat x 1 if still having symptoms of claustrophobia before test 16)  Pravastatin Sodium 40 Mg Tabs (Pravastatin sodium) .... One tablet by mouth nightly for cholesterol  Patient Instructions: 1)  We will call with plan after seeing your xrays   Orders Added: 1)  Diagnostic X-Ray/Fluoroscopy [Diagnostic X-Ray/Flu] 2)  Est. Patient Level II [16109]

## 2010-07-18 NOTE — Letter (Signed)
Summary: REFERRAL PHYSICAL THERAPY  REFERRAL PHYSICAL THERAPY   Imported By: Arta Bruce 06/28/2010 14:20:26  _____________________________________________________________________  External Attachment:    Type:   Image     Comment:   External Document

## 2010-07-18 NOTE — Progress Notes (Signed)
  Phone Note From Pharmacy   Summary of Call: Request for refill of Nabumetone--need to know when she was stopping Coumadin---if already done, please refill Nabumetone with current instructions.  Hold if still on Coumadin--to avoid bleeding problems Initial call taken by: Julieanne Manson MD,  June 20, 2010 10:43 AM  Follow-up for Phone Call        spoke with pt and she is aware Follow-up by: [name],  June 20, 2010 11:49 AM

## 2010-07-18 NOTE — Letter (Signed)
Summary: ANTICOAGULATION DOSING  ANTICOAGULATION DOSING   Imported By: Arta Bruce 07/02/2010 10:14:07  _____________________________________________________________________  External Attachment:    Type:   Image     Comment:   External Document

## 2010-07-18 NOTE — Progress Notes (Signed)
Summary: JAW PAIN PT REFERRAL  Phone Note Outgoing Call   Summary of Call: Please let pt.know her xrays looked okay.   Will ask PT to work with her on this--they are already seeing her for her knee Please send referral onto Arna Medici once pt. notified Initial call taken by: Julieanne Manson MD,  July 03, 2010 2:45 PM  Follow-up for Phone Call        PT IS AWARE Follow-up by: [name],  July 04, 2010 9:28 AM  Additional Follow-up for Phone Call Additional follow up Details #1::        I send to referral to add the jaw pain to her appt on Monday 07-08-10 @ 8;30am  Additional Follow-up by: Cheryll Dessert,  July 04, 2010 10:13 AM

## 2010-07-18 NOTE — Op Note (Signed)
Summary: Operative Report  Operative Report   Imported By: Arta Bruce 05/27/2010 10:54:28  _____________________________________________________________________  External Attachment:    Type:   Image     Comment:   External Document

## 2010-07-18 NOTE — Progress Notes (Signed)
Summary: Coumadin  Phone Note From Pharmacy   Summary of Call: yoranda would like to know how long pt needs to be on coumdain...Marland KitchenMarland Kitchen  yoranda wants to know the duration on therapy since pt was only put on it cause she could not afford lovenox after surgery.... pt has appt with [facility] Initial call taken by: [name],  May 29, 2010 3:13 PM  Follow-up for Phone Call        Should be 4 weeks but [place] - Can you call Orthopedic (Dr Madelon Lips) to be sure that 4 weeks is adequate  She had a menisectomy on 05/01/2010. She is  on coumadin for DVT prevention following procedure Follow-up by: Lehman Prom FNP,  May 30, 2010 2:26 PM  Additional Follow-up for Phone Call Additional follow up Details #1::        Left message with Pa for Dr. Madelon Lips office  to call back with duration time.Marland KitchenMarland KitchenArmenia Shannon  May 31, 2010 10:11 AM  Left message for PA to call back.Marland KitchenMarland KitchenMarland KitchenArmenia Shannon  June 04, 2010 9:47 AM     Additional Follow-up for Phone Call Additional follow up Details #2::    spoke with [facility] PA said pt need to stay on coumdian for a month only.... Judith Part is aware Follow-up by: [name],  June 04, 2010 3:32 PM

## 2010-07-19 ENCOUNTER — Encounter (INDEPENDENT_AMBULATORY_CARE_PROVIDER_SITE_OTHER): Payer: Self-pay | Admitting: Internal Medicine

## 2010-07-19 ENCOUNTER — Encounter (INDEPENDENT_AMBULATORY_CARE_PROVIDER_SITE_OTHER): Payer: Self-pay | Admitting: Nurse Practitioner

## 2010-07-21 ENCOUNTER — Emergency Department (HOSPITAL_COMMUNITY)
Admission: EM | Admit: 2010-07-21 | Discharge: 2010-07-21 | Disposition: A | Discharge status: Home / Self Care | Payer: Self-pay | Attending: Emergency Medicine | Admitting: Emergency Medicine

## 2010-07-21 DIAGNOSIS — K1370 Unspecified lesions of oral mucosa: Secondary | ICD-10-CM | POA: Insufficient documentation

## 2010-07-21 DIAGNOSIS — J45909 Unspecified asthma, uncomplicated: Secondary | ICD-10-CM | POA: Insufficient documentation

## 2010-07-21 DIAGNOSIS — M26609 Unspecified temporomandibular joint disorder, unspecified side: Secondary | ICD-10-CM | POA: Insufficient documentation

## 2010-07-21 DIAGNOSIS — H9209 Otalgia, unspecified ear: Secondary | ICD-10-CM | POA: Insufficient documentation

## 2010-07-21 DIAGNOSIS — F209 Schizophrenia, unspecified: Secondary | ICD-10-CM | POA: Insufficient documentation

## 2010-07-21 DIAGNOSIS — E119 Type 2 diabetes mellitus without complications: Secondary | ICD-10-CM | POA: Insufficient documentation

## 2010-07-21 DIAGNOSIS — I10 Essential (primary) hypertension: Secondary | ICD-10-CM | POA: Insufficient documentation

## 2010-07-22 ENCOUNTER — Ambulatory Visit: Payer: Self-pay | Admitting: Rehabilitative and Restorative Service Providers"

## 2010-07-24 ENCOUNTER — Encounter: Payer: Self-pay | Admitting: Rehabilitative and Restorative Service Providers"

## 2010-07-24 NOTE — Letter (Signed)
Summary: MAILED REQUESTED RECORDS TO [facility]  MAILED REQUESTED RECORDS TO [facility]   Imported By: Arta Bruce 07/19/2010 15:32:09  _____________________________________________________________________  External Attachment:    Type:   Image     Comment:   External Document

## 2010-07-24 NOTE — Progress Notes (Addendum)
  Phone Note Call from Patient   Summary of Call: PT SAYS SHE IS IN ALOT OF PAIN ON HER RIGHT KNEE... PT SAYS SHE HAS HAD THE SURGERY AND IS INPAIN.Marland Kitchen [facility]... PT WOULD LIKE FOR THE PROVIDER TO CALL HER BECAUSE THEY STILL TURNED HER DOWN FOR SSI.... Initial call taken by: [name],  July 10, 2010 3:26 PM  Follow-up for Phone Call        Is she going to physical therapy for the knee? Has she called the ortho surgeon regarding her knee?  If not, and the knee pain is getting worse, she needs to do so. Let her know her Xrays of teeth and jaw were normal Follow-up by: Julieanne Manson MD,  July 16, 2010 7:44 AM  Additional Follow-up for Phone Call Additional follow up Details #1::        tried calling pt but no answer .Marland KitchenMarland KitchenArmenia Shannon  July 16, 2010 11:34 AM   tried calling pt but no answer .Marland KitchenMarland KitchenArmenia Shannon  July 17, 2010 12:42 PM   tried calling pt but no answer ....will mail letter.Marland KitchenMarland KitchenArmenia Shannon  July 18, 2010 11:26 AM      Appended Document:  spoke with pt and she has has the surgery on her knee in November.... pt says she is going to PT.... 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

## 2010-07-24 NOTE — Letter (Signed)
Summary: MAILED REQUESTED RECORDS TO Maureen Swanson  MAILED REQUESTED RECORDS TO Maureen Swanson   Imported By: Arta Bruce 07/19/2010 16:35:01  _____________________________________________________________________  External Attachment:    Type:   Image     Comment:   External Document

## 2010-07-24 NOTE — Miscellaneous (Signed)
Summary: Rehab Report//INITIAL SUMMARY  Rehab Report//INITIAL SUMMARY   Imported By: Arta Bruce 07/19/2010 11:03:13  _____________________________________________________________________  External Attachment:    Type:   Image     Comment:   External Document

## 2010-07-24 NOTE — Letter (Signed)
Summary: *HSN Results Follow up  [facility]  [address] [place], [place] [address]   Phone: [phone]  Fax: [phone]      07/18/2010   [name] [name] [address] [employer], [place]  [address]   Dear  [name],                            ____S.Drinkard,FNP   ____D. Gore,FNP       ____B. McPherson,MD   ____V. Rankins,MD    ____E. Gionna Polak,MD    ____N. Daphine Deutscher, FNP  ____D. Reche Dixon, MD    ____K. Philipp Deputy, MD    ____Other     This letter is to inform you that your recent test(s):  _______Pap Smear    _______Lab Test     _______X-ray    _______ is within acceptable limits  _______ requires a medication change  _______ requires a follow-up lab visit  _______ requires a follow-up visit with your provider   Comments:  We have been trying to reach you.  Please give the office a call at your earliest convenience.       _________________________________________________________ If you have any questions, please contact our office                     Sincerely,  [name] [facility]

## 2010-07-26 ENCOUNTER — Other Ambulatory Visit (HOSPITAL_COMMUNITY): Payer: Self-pay | Admitting: Internal Medicine

## 2010-07-26 DIAGNOSIS — Z1231 Encounter for screening mammogram for malignant neoplasm of breast: Secondary | ICD-10-CM

## 2010-07-30 ENCOUNTER — Ambulatory Visit: Payer: Self-pay | Attending: Internal Medicine | Admitting: Rehabilitative and Restorative Service Providers"

## 2010-07-31 ENCOUNTER — Encounter (INDEPENDENT_AMBULATORY_CARE_PROVIDER_SITE_OTHER): Payer: Self-pay | Admitting: Internal Medicine

## 2010-07-31 LAB — CONVERTED CEMR LAB
HCT: 32.7 %
HDL: 87 mg/dL
Hemoglobin: 10.7 g/dL
Triglycerides: 100 mg/dL
WBC: 5.3 10*3/uL

## 2010-08-01 ENCOUNTER — Encounter: Payer: Self-pay | Admitting: Rehabilitative and Restorative Service Providers"

## 2010-08-05 ENCOUNTER — Ambulatory Visit (HOSPITAL_COMMUNITY)
Admission: RE | Admit: 2010-08-05 | Discharge: 2010-08-05 | Disposition: A | Discharge status: Home / Self Care | Payer: Self-pay | Source: Ambulatory Visit | Attending: Internal Medicine | Admitting: Internal Medicine

## 2010-08-05 ENCOUNTER — Ambulatory Visit (HOSPITAL_COMMUNITY): Payer: Self-pay

## 2010-08-05 ENCOUNTER — Encounter (INDEPENDENT_AMBULATORY_CARE_PROVIDER_SITE_OTHER): Payer: Self-pay | Admitting: Internal Medicine

## 2010-08-05 ENCOUNTER — Encounter (HOSPITAL_COMMUNITY): Payer: Self-pay

## 2010-08-05 DIAGNOSIS — R928 Other abnormal and inconclusive findings on diagnostic imaging of breast: Secondary | ICD-10-CM | POA: Insufficient documentation

## 2010-08-05 DIAGNOSIS — Z1231 Encounter for screening mammogram for malignant neoplasm of breast: Secondary | ICD-10-CM | POA: Insufficient documentation

## 2010-08-06 ENCOUNTER — Ambulatory Visit: Payer: Self-pay

## 2010-08-06 ENCOUNTER — Encounter (INDEPENDENT_AMBULATORY_CARE_PROVIDER_SITE_OTHER): Payer: Self-pay | Admitting: Internal Medicine

## 2010-08-07 ENCOUNTER — Other Ambulatory Visit: Payer: Self-pay | Admitting: Internal Medicine

## 2010-08-07 DIAGNOSIS — R928 Other abnormal and inconclusive findings on diagnostic imaging of breast: Secondary | ICD-10-CM

## 2010-08-13 ENCOUNTER — Ambulatory Visit
Admission: RE | Admit: 2010-08-13 | Discharge: 2010-08-13 | Disposition: A | Discharge status: Home / Self Care | Payer: Self-pay | Source: Ambulatory Visit | Attending: Internal Medicine | Admitting: Internal Medicine

## 2010-08-13 DIAGNOSIS — R928 Other abnormal and inconclusive findings on diagnostic imaging of breast: Secondary | ICD-10-CM

## 2010-08-15 ENCOUNTER — Ambulatory Visit: Payer: Self-pay | Attending: Internal Medicine

## 2010-08-15 DIAGNOSIS — M25569 Pain in unspecified knee: Secondary | ICD-10-CM | POA: Insufficient documentation

## 2010-08-15 DIAGNOSIS — IMO0001 Reserved for inherently not codable concepts without codable children: Secondary | ICD-10-CM | POA: Insufficient documentation

## 2010-08-15 DIAGNOSIS — M25669 Stiffness of unspecified knee, not elsewhere classified: Secondary | ICD-10-CM | POA: Insufficient documentation

## 2010-08-16 ENCOUNTER — Encounter (INDEPENDENT_AMBULATORY_CARE_PROVIDER_SITE_OTHER): Payer: Self-pay | Admitting: Internal Medicine

## 2010-08-16 ENCOUNTER — Telehealth (INDEPENDENT_AMBULATORY_CARE_PROVIDER_SITE_OTHER): Payer: Self-pay | Admitting: Internal Medicine

## 2010-08-22 ENCOUNTER — Ambulatory Visit: Payer: Self-pay | Admitting: Physical Therapy

## 2010-08-22 NOTE — Miscellaneous (Signed)
Summary: labs sent from [facility]  Clinical Lists Changes  Observations: Added new observation of HGBA1C: 5.7 % (07/31/2010 14:14) Added new observation of PLATELETK/UL: 260 K/uL (07/31/2010 14:14) Added new observation of MCV: 90.1 fL (07/31/2010 14:14) Added new observation of HCT: 32.7 % (07/31/2010 14:14) Added new observation of HGB: 10.7 g/dL (40/98/1191 47:82) Added new observation of WBC COUNT: 5.3 10*3/microliter (07/31/2010 14:14) Added new observation of LDL: 119 mg/dL (95/62/1308 65:78) Added new observation of HDL: 87 mg/dL (46/96/2952 84:13) Added new observation of TRIGLYC TOT: 100 mg/dL (24/40/1027 25:36) Added new observation of CHOLESTEROL: 226 mg/dL (64/40/3474 25:95)    Labs from [facility].

## 2010-08-27 ENCOUNTER — Encounter (INDEPENDENT_AMBULATORY_CARE_PROVIDER_SITE_OTHER): Payer: Self-pay | Admitting: Internal Medicine

## 2010-08-27 ENCOUNTER — Encounter: Payer: Self-pay | Admitting: Rehabilitative and Restorative Service Providers"

## 2010-08-27 ENCOUNTER — Telehealth (INDEPENDENT_AMBULATORY_CARE_PROVIDER_SITE_OTHER): Payer: Self-pay | Admitting: Internal Medicine

## 2010-08-27 LAB — PROTIME-INR: Prothrombin Time: 13.6 seconds (ref 11.6–15.2)

## 2010-08-27 LAB — BASIC METABOLIC PANEL
BUN: 10 mg/dL (ref 6–23)
Chloride: 98 mEq/L (ref 96–112)
Creatinine, Ser: 1.01 mg/dL (ref 0.4–1.2)
Glucose, Bld: 100 mg/dL — ABNORMAL HIGH (ref 70–99)
Potassium: 3.6 mEq/L (ref 3.5–5.1)

## 2010-08-27 LAB — DIFFERENTIAL
Eosinophils Relative: 1 % (ref 0–5)
Lymphocytes Relative: 26 % (ref 12–46)
Lymphs Abs: 1.6 10*3/uL (ref 0.7–4.0)
Monocytes Absolute: 0.5 10*3/uL (ref 0.1–1.0)
Monocytes Relative: 8 % (ref 3–12)

## 2010-08-27 LAB — CBC
HCT: 39.1 % (ref 36.0–46.0)
Hemoglobin: 12.9 g/dL (ref 12.0–15.0)
MCV: 89.5 fL (ref 78.0–100.0)
RBC: 4.37 MIL/uL (ref 3.87–5.11)
WBC: 6 10*3/uL (ref 4.0–10.5)

## 2010-08-27 LAB — CONVERTED CEMR LAB: RBC Folate: 457 ng/mL (ref 180–600)

## 2010-08-28 ENCOUNTER — Encounter: Payer: Self-pay | Admitting: Rehabilitative and Restorative Service Providers"

## 2010-08-28 ENCOUNTER — Encounter (INDEPENDENT_AMBULATORY_CARE_PROVIDER_SITE_OTHER): Payer: Self-pay | Admitting: Internal Medicine

## 2010-08-28 LAB — CONVERTED CEMR LAB
Saturation Ratios: 35 % (ref 20–55)
TIBC: 327 ug/dL (ref 250–470)
UIBC: 211 ug/dL

## 2010-08-29 ENCOUNTER — Encounter: Payer: Self-pay | Admitting: Rehabilitative and Restorative Service Providers"

## 2010-09-03 NOTE — Assessment & Plan Note (Signed)
Summary: Iron Studies, B12, RBC folate, Guaiac Packet for anemia  Nurse Visit   Allergies: 1)  ! Penicillin  Orders Added: 1)  Est. Patient Level I [16109] 2)  T-Folic Acid; RBC [82747-23360] 3)  T-Iron Binding Capacity (TIBC) [[address]] 4)  T-Vitamin B12 [82607-23330]

## 2010-09-03 NOTE — Progress Notes (Signed)
Summary: pt. needs follow up labs from [facility]  Phone Note Outgoing Call   Summary of Call: Please have pt. come in for iron studies, B12, RBC folate and to perform guaiac stool card packet for anemia. Also--referral to Diabetic Education/nutrition for borderline A1C at 5.7% Initial call taken by: Julieanne Manson MD,  August 16, 2010 2:18 PM  Follow-up for Phone Call        Called pt. and sched. her lab appt. for 08/27/10 @ 8:45 ... Will foward phone note to [facility]na Medici so that she may f/u w/the nutriionist referral.... Hale Drone CMA  August 26, 2010 3:50 PM   Additional Follow-up for Phone Call Additional follow up Details #1::        I need a Referral to be able to send the pt to the nutritionist Thank you .Marland KitchenCheryll Dessert  August 26, 2010 4:40 PM  Will foward to Dr. Delrae Alfred... Hale Drone CMA  August 28, 2010 12:47 PM  Done  Julieanne Manson MD  August 29, 2010 3:02 PM   New Problems: ANEMIA (ICD-285.9) HYPERGLYCEMIA (ICD-790.29)   Additional Follow-up for Phone Call Additional follow up Details #2::    In order for me to send the nutritionist referral I need your signature . I will give it to you when I see you .Marland KitchenCheryll Dessert  August 29, 2010 3:31 PM FNP Zena Amos sign the order so I can send it today   Follow-up by: Cheryll Dessert,  August 30, 2010 10:32 AM  New Problems: ANEMIA (ICD-285.9) HYPERGLYCEMIA (ICD-790.29)

## 2010-09-04 ENCOUNTER — Ambulatory Visit: Payer: Self-pay | Admitting: Rehabilitative and Restorative Service Providers"

## 2010-09-05 ENCOUNTER — Encounter: Payer: Self-pay | Admitting: Rehabilitative and Restorative Service Providers"

## 2010-09-11 ENCOUNTER — Encounter (INDEPENDENT_AMBULATORY_CARE_PROVIDER_SITE_OTHER): Payer: Self-pay | Admitting: Internal Medicine

## 2010-09-12 NOTE — Progress Notes (Signed)
  Phone Note Call from Patient   Summary of Call: pt came in to do labs and she stated that both of her knees are brothering her... pt says her left knee is hurting b/c of fluid and she thinks she injured the right knee with the pain a 10...Marland KitchenMarland Kitchen pt says can she get xrays Initial call taken by: [name],  August 27, 2010 11:53 AM  Follow-up for Phone Call        She was following with Dr. Madelon Lips for her knees previously--is she no longer following up with him?  She had knee surgery with him in December  Called pt... Referred her back to Dr. Candise Bowens office since he needs to be aware of the problems in case there are some additional appts needed... Her last visit w/him was back in February 2012 and does not know when her next f/u appt is on... She called him today and is waiting for a call back.... Hale Drone CMA  September 04, 2010 5:31 PM  Follow-up by: Julieanne Manson MD,  August 29, 2010 3:04 PM

## 2010-09-13 ENCOUNTER — Ambulatory Visit: Payer: Self-pay | Admitting: *Deleted

## 2010-09-15 ENCOUNTER — Emergency Department (HOSPITAL_COMMUNITY)
Admission: EM | Admit: 2010-09-15 | Discharge: 2010-09-15 | Disposition: A | Discharge status: Home / Self Care | Payer: Self-pay | Attending: Emergency Medicine | Admitting: Emergency Medicine

## 2010-09-15 ENCOUNTER — Emergency Department (HOSPITAL_COMMUNITY): Payer: Self-pay

## 2010-09-15 DIAGNOSIS — K2970 Gastritis, unspecified, without bleeding: Secondary | ICD-10-CM | POA: Insufficient documentation

## 2010-09-15 DIAGNOSIS — F3289 Other specified depressive episodes: Secondary | ICD-10-CM | POA: Insufficient documentation

## 2010-09-15 DIAGNOSIS — M25569 Pain in unspecified knee: Secondary | ICD-10-CM | POA: Insufficient documentation

## 2010-09-15 DIAGNOSIS — F329 Major depressive disorder, single episode, unspecified: Secondary | ICD-10-CM | POA: Insufficient documentation

## 2010-09-15 DIAGNOSIS — J45909 Unspecified asthma, uncomplicated: Secondary | ICD-10-CM | POA: Insufficient documentation

## 2010-09-15 DIAGNOSIS — I10 Essential (primary) hypertension: Secondary | ICD-10-CM | POA: Insufficient documentation

## 2010-09-15 DIAGNOSIS — E119 Type 2 diabetes mellitus without complications: Secondary | ICD-10-CM | POA: Insufficient documentation

## 2010-09-15 DIAGNOSIS — F209 Schizophrenia, unspecified: Secondary | ICD-10-CM | POA: Insufficient documentation

## 2010-09-15 DIAGNOSIS — M129 Arthropathy, unspecified: Secondary | ICD-10-CM | POA: Insufficient documentation

## 2010-09-15 LAB — URINALYSIS, ROUTINE W REFLEX MICROSCOPIC
Glucose, UA: NEGATIVE mg/dL
Ketones, ur: NEGATIVE mg/dL
pH: 5.5 (ref 5.0–8.0)

## 2010-09-17 NOTE — Letter (Signed)
Summary: MAILED REQUESTED RECORDS TO [facility]  MAILED REQUESTED RECORDS TO [facility]   Imported By: Arta Bruce 09/11/2010 16:20:55  _____________________________________________________________________  External Attachment:    Type:   Image     Comment:   External Document

## 2010-09-18 ENCOUNTER — Encounter: Payer: Self-pay | Attending: Internal Medicine | Admitting: *Deleted

## 2010-09-18 DIAGNOSIS — Z713 Dietary counseling and surveillance: Secondary | ICD-10-CM | POA: Insufficient documentation

## 2010-09-18 DIAGNOSIS — R7309 Other abnormal glucose: Secondary | ICD-10-CM | POA: Insufficient documentation

## 2010-09-18 DIAGNOSIS — D649 Anemia, unspecified: Secondary | ICD-10-CM | POA: Insufficient documentation

## 2010-09-24 LAB — BASIC METABOLIC PANEL
BUN: 12 mg/dL (ref 6–23)
Calcium: 9.3 mg/dL (ref 8.4–10.5)
Chloride: 100 mEq/L (ref 96–112)
Chloride: 107 mEq/L (ref 96–112)
Creatinine, Ser: 0.99 mg/dL (ref 0.4–1.2)
GFR calc Af Amer: >60 mL/min (ref >60)
GFR calc Af Amer: >60 mL/min (ref >60)
Potassium: 3.8 mEq/L (ref 3.5–5.1)
Sodium: 137 mEq/L (ref 135–145)

## 2010-09-24 LAB — COMPREHENSIVE METABOLIC PANEL
ALT: 13 U/L (ref 0–35)
AST: 32 U/L (ref 0–37)
Alkaline Phosphatase: 79 U/L (ref 39–117)
CO2: 22 mEq/L (ref 19–32)
Chloride: 101 mEq/L (ref 96–112)
GFR calc Af Amer: >60 mL/min (ref >60)
GFR calc non Af Amer: >60 mL/min (ref >60)
Glucose, Bld: 112 mg/dL — ABNORMAL HIGH (ref 70–99)
Potassium: 3.4 mEq/L — ABNORMAL LOW (ref 3.5–5.1)
Sodium: 136 mEq/L (ref 135–145)
Total Bilirubin: 1.1 mg/dL (ref 0.3–1.2)

## 2010-09-24 LAB — RAPID URINE DRUG SCREEN, HOSP PERFORMED
Amphetamines: NOT DETECTED
Barbiturates: NOT DETECTED
Cocaine: NOT DETECTED
Opiates: NOT DETECTED
Tetrahydrocannabinol: NOT DETECTED

## 2010-09-24 LAB — POCT CARDIAC MARKERS
CKMB, poc: <1 ng/mL — ABNORMAL LOW (ref 1.0–8.0)
CKMB, poc: <1 ng/mL — ABNORMAL LOW (ref 1.0–8.0)
Myoglobin, poc: 44 ng/mL (ref 12–200)
Myoglobin, poc: 33.3 ng/mL (ref 12–200)
Myoglobin, poc: 43.8 ng/mL (ref 12–200)

## 2010-09-24 LAB — CBC
HCT: 38.8 % (ref 36.0–46.0)
Hemoglobin: 11.4 g/dL — ABNORMAL LOW (ref 12.0–15.0)
MCV: 88.2 fL (ref 78.0–100.0)
MCV: 88.8 fL (ref 78.0–100.0)
RBC: 4.41 MIL/uL (ref 3.87–5.11)
RBC: 3.93 MIL/uL (ref 3.87–5.11)
RDW: 13.3 % (ref 11.5–15.5)
WBC: 5.8 10*3/uL (ref 4.0–10.5)
WBC: 8.4 10*3/uL (ref 4.0–10.5)
WBC: 5.6 10*3/uL (ref 4.0–10.5)

## 2010-09-24 LAB — LIPID PANEL
LDL Cholesterol: 117 mg/dL — ABNORMAL HIGH (ref 0–99)
Total CHOL/HDL Ratio: 3.8 RATIO
VLDL: 48 mg/dL — ABNORMAL HIGH (ref 0–40)

## 2010-09-24 LAB — D-DIMER, QUANTITATIVE
D-Dimer, Quant: 0.36 ug/mL-FEU (ref 0.00–0.48)
D-Dimer, Quant: 0.59 ug/mL-FEU — ABNORMAL HIGH (ref 0.00–0.48)

## 2010-09-24 LAB — DIFFERENTIAL
Lymphs Abs: 1.9 10*3/uL (ref 0.7–4.0)
Lymphs Abs: 1.5 10*3/uL (ref 0.7–4.0)
Monocytes Relative: 7 % (ref 3–12)
Monocytes Relative: 5 % (ref 3–12)
Neutro Abs: 5.8 10*3/uL (ref 1.7–7.7)
Neutro Abs: 3.7 10*3/uL (ref 1.7–7.7)
Neutrophils Relative %: 69 % (ref 43–77)
Neutrophils Relative %: 67 % (ref 43–77)

## 2010-09-24 LAB — URINALYSIS, ROUTINE W REFLEX MICROSCOPIC
Bilirubin Urine: NEGATIVE
Hgb urine dipstick: NEGATIVE
Ketones, ur: NEGATIVE mg/dL
Specific Gravity, Urine: 1.046 — ABNORMAL HIGH (ref 1.005–1.030)
pH: 5 (ref 5.0–8.0)

## 2010-09-24 LAB — CARDIAC PANEL(CRET KIN+CKTOT+MB+TROPI)
CK, MB: 0.9 ng/mL (ref 0.3–4.0)
Relative Index: INVALID (ref 0.0–2.5)
Total CK: 42 U/L (ref 7–177)
Troponin I: 0.01 ng/mL (ref 0.00–0.06)

## 2010-09-24 LAB — HEPARIN LEVEL (UNFRACTIONATED): Heparin Unfractionated: 0.25 IU/mL — ABNORMAL LOW (ref 0.30–0.70)

## 2010-09-24 LAB — APTT: aPTT: 139 seconds — ABNORMAL HIGH (ref 24–37)

## 2010-09-24 LAB — LIPASE, BLOOD: Lipase: 23 U/L (ref 11–59)

## 2010-10-01 LAB — URINE CULTURE: Colony Count: >=100000

## 2010-10-01 LAB — URINALYSIS, ROUTINE W REFLEX MICROSCOPIC
Glucose, UA: NEGATIVE mg/dL
pH: 5.5 (ref 5.0–8.0)

## 2010-10-01 LAB — WET PREP, GENITAL

## 2010-10-17 ENCOUNTER — Ambulatory Visit (INDEPENDENT_AMBULATORY_CARE_PROVIDER_SITE_OTHER): Payer: Self-pay | Admitting: Family Medicine

## 2010-10-17 ENCOUNTER — Encounter: Payer: Self-pay | Admitting: Family Medicine

## 2010-10-17 VITALS — BP 150/120 | Ht 64.0 in | Wt 205.0 lb

## 2010-10-17 DIAGNOSIS — IMO0002 Reserved for concepts with insufficient information to code with codable children: Secondary | ICD-10-CM

## 2010-10-17 DIAGNOSIS — M25569 Pain in unspecified knee: Secondary | ICD-10-CM

## 2010-10-17 MED ORDER — TRAMADOL HCL 50 MG PO TABS
50.0000 mg | ORAL_TABLET | Freq: Four times a day (QID) | ORAL | Status: AC | PRN
Start: 2010-10-17 — End: 2010-10-27

## 2010-10-17 NOTE — Assessment & Plan Note (Signed)
Status post arthroscopy and medial meniscectomy by Dr. Madelon Lips 05/2010

## 2010-10-17 NOTE — Progress Notes (Signed)
  Subjective:    Patient ID: Maureen Swanson, female    DOB: 15-May-1966, 45 y.o.   MRN: [id number]  HPI 45 year old new patient to the office for evaluation of bilateral knee pain. Pain has been ongoing in both knees for the past year, denies any injury or trauma. Had been seeing Dr. Madelon Lips for her right knee and underwent medial meniscectomy 05/2010. Since that time continues to have pain, had followed up with him in the postop period and he felt she was doing well. She has not seen Dr. Madelon Lips since March. Right knee continues to swell and she continues to have a catching and popping sensation in the knee. Knee feels unsteady. Notes a fullness in the posterior aspect of her knee. No previous knee injections. Had x-rays in the emergency room 09/2010 which showed no acute abnormalities and no significant DJD.  Had completed several sessions of physical therapy after arthroscopy, but she felt this was making her knee pain worse. She's been walking with the assistance of a cane since her surgery  Left knee has been bothersome for the past year, no injury or trauma. Similar symptoms as on the right with swelling, popping, catching. No previous x-rays or MRI on this knee. Has not talked to Dr. Madelon Lips about this knee. Not taking any medications for her knee pain at this time.  PMH: Hypertension, GERD, gout, anxiety, depression PSH: Right knee arthroscopy by Dr. Madelon Lips 05/2010 MEDS: Trazodone, Wellbutrin, Celexa, Protonix, HCTZ, allopurinol   Review of Systems    per history of present illness, otherwise negative Objective:   Physical Exam GENERAL: Alert and oriented x3, no acute distress, pleasant SKIN: No rashes or lesions RESPIRATORY: Respirations 15 and unlabored MSK: - Knees: Right knee with arthroscopy portal scars well-healed. Mild soft tissue swelling medially, no effusion. Range of motion 0-120 without pain. Extremely tender palpation over the medial joint line, mild lateral joint line  tenderness. Fullness in popliteal fossa consistent with Baker's cyst, no surrounding erythema or bruising. No ligamentous laxity. Mild medial knee pain with McMurray's. Left knee with trace effusion, mild soft tissue swelling. Range of motion 0-120 with pain. Extremely tender to palpation over the lateral joint line, mild medial joint line tenderness. Fullness in the popliteal fossa consistent with a Baker's cyst. Lateral knee pain with McMurray's. No ligamentous laxity. Good quad and hamstring strength.  - Gait: Antalgic gait and walking with assistance of a cane Neurovascularly intact distally   MSK ultrasound:  - R knee: Normal appearing patellar tendon, no increased fluid in the suprapatellar pouch. Hypoechoic area surrounding medial meniscus consistent with post operative changes, no meniscal tearing or fragmentation is appreciated. Normal appearing lateral meniscus.  - L knee: Normal appearing quadriceps tendon and patellar tendon. Slight increased fluid in the suprapatellar pouch. Normal appearing medial and lateral meniscus without any signs of tearing or fragmentation.   images saved        Assessment & Plan:

## 2010-10-17 NOTE — Assessment & Plan Note (Addendum)
-   Bilateral knee pain R>L, s/p medial meniscectomy on right 05/2010 by Dr. Madelon Lips - MSK ultrasound showing postoperative changes around the right medial meniscus, no meniscal pathology appreciated in the left knee on scanning today - Underwent bilateral corticosteroid injection of knees and office today PROCEDURE NOTE Consent obtained and verified. Area cleansed with alcohol. Topical analgesic spray: Ethyl chloride. Joint: Left and right knee Approached in typical fashion with: Anterior medial approach Completed without difficulty Meds: 4 cc 1% lidocaine + 1 cc Kenalog 40 mg/cc injected into each knee Needle: 21G 1.5 inch Aftercare instructions and Red flags advised.  Cautioned that steroid injections may temporarily raise her blood sugars. Tolerated procedure well without any complications. - Fitted with knee brace to wear on the right for added support. - Should perform home exercises as instructed at physical therapy - Rx for tramadol given to use on an as-needed basis. 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 - Patient is to followup 6 weeks for reevaluation, may return sooner if needed

## 2010-10-17 NOTE — Assessment & Plan Note (Signed)
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

## 2010-10-17 NOTE — Assessment & Plan Note (Signed)
Obesity is contributing to her knee pain. Emphasized weight loss to help with symptoms.

## 2010-10-29 NOTE — Discharge Summary (Signed)
NAMECLARK, Maureen                  ACCOUNT NO.:  [id number]   MEDICAL RECORD NO.:  [id number]          PATIENT TYPE:  INP   LOCATION:  2008                         FACILITY:  [facility]   PHYSICIAN:  Antonieta Iba, MD   DATE OF BIRTH:  27-Sep-1965   DATE OF ADMISSION:  10/30/2008  DATE OF DISCHARGE:  10/31/2008                               DISCHARGE SUMMARY   DISCHARGE DIAGNOSES:  1. Chest pain, probably noncardiac with negative Myoview this      admission.  2. Labile blood pressure with transient hypotension this admission,      her medications were cut back for this.  3. History of hypertension.  4. History of PENICILLIN allergy.  5. Gastroesophageal reflux.   HOSPITAL COURSE:  The patient is a 45 year old female followed by Dr.  Luciana Axe at [facility].  She has a history of hypertension.  She was seen  in the emergency room on Friday prior to this admission with chest pain  and discharged home.  She describes intermittent right-sided chest pain  which is worse with inspiration.  It is also tender to palpation.  She  was admitted from the emergency room.  She does have a history of crack  cocaine use but says she has not used any cocaine since Mothers' Day  this year.  CT scan was ordered from the emergency room.  This was  negative for pulmonary embolism.  Troponins were negative x3.  She was  put on heparin on admission until all her enzymes were back.  Urine drug  screen was negative for cocaine.  Lexiscan scan Myoview showed no  perfusion defects and normal LV function with an EF of 70%.   LABORATORY DATA:  Cholesterol is 224, LDL 117, HDL 59.  Urinalysis is  unremarkable.  Drug screen is negative.  BNP is less than 30.  Troponins  are negative.  Sodium 136, potassium 3.4, BUN 11, creatinine 0.93.  LFTs  are normal.  Amylase and lipase are normal.  INR is 1.1.  White count  5.8, hemoglobin 11.4, hematocrit 33.2, platelets 200.   RADIOLOGY:  CT is as noted above.  Chest x-ray  shows no evidence of  acute cardiopulmonary disease.  EKG shows sinus rhythm without acute  changes.  Telemetry shows sinus rhythm without acute changes.   DISCHARGE MEDICATIONS:  1. Hydrochlorothiazide 12.5 mg a day.  2. Flexeril t.i.d. p.r.n.  3. Pepcid 20 mg 2 tablets twice a day,  4. Potassium 10 mEq a day.  5. Zoloft 50 mg a day.  6. Carafate 2 teaspoons a.c. and h.s.  7. MiraLax.   DISPOSITION:  The patient is discharged in stable condition.  She  requested that she be discharged on the evening of Oct 31, 2008, instead  of waiting until the morning of Nov 01, 2008.  She will follow up with  Dr. Luciana Axe.  We will see her p.r.n.      Maureen Swanson, P.A.      Antonieta Iba, MD  Electronically Signed    LKK/MEDQ  D:  10/31/2008  T:  11/01/2008  Job:  [id number]   cc:   Jillyn Hidden A. Rankin, M.D.
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
## 2010-10-29 NOTE — Op Note (Signed)
Maureen Swanson, Maureen Swanson                  ACCOUNT NO.:  [id number]   MEDICAL RECORD NO.:  [id number]          PATIENT TYPE:  AMB   LOCATION:  ENDO                         FACILITY:  [facility]   PHYSICIAN:  Ulyess Mort, MD  DATE OF BIRTH:  Jan 24, 1966   DATE OF PROCEDURE:  01/31/2009  DATE OF DISCHARGE:                               OPERATIVE REPORT   The patient presents with a history of constipation over the past 4  months increasing in severity.  She also has a family history with an  aunt and grandfather having had colon cancer.  She has had no rectal  bleeding.  No other change in bowel habits.  She does take numerous  medications including MiraLax, Zoloft, Ambien, Maxzide, trazodone,  __________ 10 mg caps, Naprosyn, Antivert, Auralgan, and benztropine  mesylate.  Other medical problems include dizziness, joint effusion of  left knee, otogenic pain, and depression.  History of asthma.   PHYSICAL EXAMINATION:  The patient is 45 years of age.  Blood pressure 118/82 and regular, respirations 18.  ASA 1, oropharynx negative, neck negative.  HEART:  Regular rhythm.  CHEST:  Clear to auscultation.  ABDOMEN:  Somewhat protuberant, soft, no masses or organomegaly.  She  did have some slight tenderness in the epigastric region.  EXTREMITIES:  Unremarkable.  RECTAL:  Examination was within normal limits.   PROCEDURE:  The patient was medicated with 100 mcg of fentanyl and 10 mg  of Versed IV for the procedure.  An Olympus colonoscope was advanced  throughout the colon with some slight difficulty because it was slightly  tortuous.  There was a big sigmoid loop.  The scope was then advanced  into the cecum.  On retroflexion, careful inspection of a fairly well-  prepped colon failed to reveal any mucosa lesions of significance.  The  patient tolerated the procedure and the endoscope was retracted  completely.   IMPRESSION:  Coming from rectum to cecum, somewhat tortuous.  A fairly  well-prepped colon with no evidence of pathologic lesions to explain  constipation.  There were no premalignant lesions noted.   COMMENT:  I think this patient should be treated with constipation  medications with dietary measures.  She is to have a repeat colonoscopic  examination because of family history in 5-10 years or sooner if any  potential symptoms.  I do also think that because of her history of  slight dysphagia with solid foods that this should be followed.  The  patient should be treated with proton pump inhibitors and possibly she  should have a barium swallow if this persists and an upper endoscopic  examination for any persistent or recurrent symptoms and the results of  the barium swallow.      Ulyess Mort, MD  Electronically Signed     SML/MEDQ  D:  01/31/2009  T:  01/31/2009  Job:  [id number]   cc:   Dala Dock

## 2010-11-05 ENCOUNTER — Other Ambulatory Visit (HOSPITAL_COMMUNITY): Payer: Self-pay | Admitting: Internal Medicine

## 2010-11-05 DIAGNOSIS — N950 Postmenopausal bleeding: Secondary | ICD-10-CM

## 2010-11-07 ENCOUNTER — Ambulatory Visit (HOSPITAL_COMMUNITY)
Admission: RE | Admit: 2010-11-07 | Discharge: 2010-11-07 | Disposition: A | Discharge status: Home / Self Care | Payer: Self-pay | Source: Ambulatory Visit | Attending: Internal Medicine | Admitting: Internal Medicine

## 2010-11-07 ENCOUNTER — Other Ambulatory Visit: Payer: Self-pay | Admitting: Internal Medicine

## 2010-11-07 ENCOUNTER — Ambulatory Visit (HOSPITAL_COMMUNITY): Payer: Self-pay

## 2010-11-07 DIAGNOSIS — N950 Postmenopausal bleeding: Secondary | ICD-10-CM | POA: Insufficient documentation

## 2010-11-07 DIAGNOSIS — M545 Low back pain: Secondary | ICD-10-CM

## 2010-11-07 DIAGNOSIS — D259 Leiomyoma of uterus, unspecified: Secondary | ICD-10-CM | POA: Insufficient documentation

## 2010-11-07 DIAGNOSIS — R1032 Left lower quadrant pain: Secondary | ICD-10-CM | POA: Insufficient documentation

## 2010-11-07 DIAGNOSIS — N83209 Unspecified ovarian cyst, unspecified side: Secondary | ICD-10-CM | POA: Insufficient documentation

## 2010-11-12 ENCOUNTER — Ambulatory Visit
Admission: RE | Admit: 2010-11-12 | Discharge: 2010-11-12 | Disposition: A | Discharge status: Home / Self Care | Payer: No Typology Code available for payment source | Source: Ambulatory Visit | Attending: Internal Medicine | Admitting: Internal Medicine

## 2010-11-12 DIAGNOSIS — M545 Low back pain: Secondary | ICD-10-CM

## 2010-11-18 ENCOUNTER — Ambulatory Visit: Payer: Self-pay | Admitting: *Deleted

## 2010-11-19 ENCOUNTER — Ambulatory Visit: Payer: Self-pay | Admitting: Family Medicine

## 2010-11-26 ENCOUNTER — Other Ambulatory Visit (HOSPITAL_COMMUNITY): Payer: Self-pay | Admitting: Family Medicine

## 2010-11-26 DIAGNOSIS — N950 Postmenopausal bleeding: Secondary | ICD-10-CM

## 2010-12-05 ENCOUNTER — Encounter: Payer: Self-pay | Attending: Internal Medicine | Admitting: *Deleted

## 2010-12-05 DIAGNOSIS — R7309 Other abnormal glucose: Secondary | ICD-10-CM | POA: Insufficient documentation

## 2010-12-05 DIAGNOSIS — D649 Anemia, unspecified: Secondary | ICD-10-CM | POA: Insufficient documentation

## 2010-12-05 DIAGNOSIS — Z713 Dietary counseling and surveillance: Secondary | ICD-10-CM | POA: Insufficient documentation

## 2010-12-17 ENCOUNTER — Ambulatory Visit (HOSPITAL_COMMUNITY): Payer: Self-pay

## 2010-12-20 ENCOUNTER — Other Ambulatory Visit: Payer: Self-pay | Admitting: Internal Medicine

## 2010-12-20 DIAGNOSIS — M549 Dorsalgia, unspecified: Secondary | ICD-10-CM

## 2010-12-24 ENCOUNTER — Ambulatory Visit (HOSPITAL_COMMUNITY)
Admission: RE | Admit: 2010-12-24 | Discharge: 2010-12-24 | Disposition: A | Discharge status: Home / Self Care | Payer: Self-pay | Source: Ambulatory Visit | Attending: Family Medicine | Admitting: Family Medicine

## 2010-12-24 ENCOUNTER — Ambulatory Visit (HOSPITAL_COMMUNITY): Payer: Self-pay

## 2010-12-24 ENCOUNTER — Other Ambulatory Visit (HOSPITAL_COMMUNITY): Payer: Self-pay | Admitting: Family Medicine

## 2010-12-24 DIAGNOSIS — N950 Postmenopausal bleeding: Secondary | ICD-10-CM

## 2010-12-24 DIAGNOSIS — N83209 Unspecified ovarian cyst, unspecified side: Secondary | ICD-10-CM | POA: Insufficient documentation

## 2010-12-24 DIAGNOSIS — D259 Leiomyoma of uterus, unspecified: Secondary | ICD-10-CM | POA: Insufficient documentation

## 2010-12-25 ENCOUNTER — Other Ambulatory Visit: Payer: Self-pay

## 2010-12-31 ENCOUNTER — Other Ambulatory Visit: Payer: Self-pay

## 2011-01-05 ENCOUNTER — Emergency Department (HOSPITAL_COMMUNITY)
Admission: EM | Admit: 2011-01-05 | Discharge: 2011-01-05 | Disposition: A | Discharge status: Home / Self Care | Payer: Self-pay | Attending: Emergency Medicine | Admitting: Emergency Medicine

## 2011-01-05 DIAGNOSIS — M79609 Pain in unspecified limb: Secondary | ICD-10-CM | POA: Insufficient documentation

## 2011-01-05 DIAGNOSIS — Z538 Procedure and treatment not carried out for other reasons: Secondary | ICD-10-CM | POA: Insufficient documentation

## 2011-01-22 ENCOUNTER — Ambulatory Visit
Admission: RE | Admit: 2011-01-22 | Discharge: 2011-01-22 | Disposition: A | Discharge status: Home / Self Care | Payer: Self-pay | Source: Ambulatory Visit | Attending: Internal Medicine | Admitting: Internal Medicine

## 2011-01-22 DIAGNOSIS — M549 Dorsalgia, unspecified: Secondary | ICD-10-CM

## 2011-01-22 MED ORDER — IOHEXOL 180 MG/ML  SOLN
1.0000 mL | Freq: Once | INTRAMUSCULAR | Status: AC | PRN
  Administered 2011-01-22: 1 mL via EPIDURAL

## 2011-01-22 MED ORDER — METHYLPREDNISOLONE ACETATE 40 MG/ML INJ SUSP (RADIOLOG
120.0000 mg | Freq: Once | INTRAMUSCULAR | Status: AC
  Administered 2011-01-22: 120 mg via EPIDURAL

## 2011-02-06 ENCOUNTER — Ambulatory Visit: Payer: Self-pay | Admitting: Family Medicine

## 2011-02-06 ENCOUNTER — Encounter: Payer: Self-pay | Admitting: Family Medicine

## 2011-02-06 ENCOUNTER — Ambulatory Visit (INDEPENDENT_AMBULATORY_CARE_PROVIDER_SITE_OTHER): Payer: Self-pay | Admitting: Family Medicine

## 2011-02-06 VITALS — BP 152/112 | HR 71

## 2011-02-06 DIAGNOSIS — M222X9 Patellofemoral disorders, unspecified knee: Secondary | ICD-10-CM

## 2011-02-06 DIAGNOSIS — F209 Schizophrenia, unspecified: Secondary | ICD-10-CM

## 2011-02-06 DIAGNOSIS — F1010 Alcohol abuse, uncomplicated: Secondary | ICD-10-CM

## 2011-02-06 DIAGNOSIS — M25569 Pain in unspecified knee: Secondary | ICD-10-CM

## 2011-02-06 MED ORDER — DICLOFENAC SODIUM 75 MG PO TBEC: 75.0000 mg | DELAYED_RELEASE_TABLET | Freq: Two times a day (BID) | ORAL | Status: DC

## 2011-02-06 NOTE — Progress Notes (Signed)
  Subjective:    Patient ID: Maureen Swanson, female    DOB: 07/22/1965, 45 y.o.   MRN: [id number]  HPI  Bilateral knee pain right greater than left. Was seen here several months ago and had corticosteroid injection which helped quite a bit for about 2 months. She said had the same kind of pain again having it most of the time now. Pain is 6-7/10 worse with walking. It is an aching type pain located in the center of the knee joint. She's had no locking or giving way. PERTINENT  PMH / PSH: History of arthroscopy with partial meniscectomy on the right knee. History of alcohol abuse episodic. Last creatinine was 1.0 in the last one year.  Review of Systems Pertinent review of systems: negative for fever or unusual weight change.     Objective:   Physical Exam  GENERAL: Well-developed slightly her white female no acute distress KNEES: Bilaterally she has full extension and flexion. Neither knee has effusion. The right knee has a lot of synovial thickening. Bilateral patella are tracking more medially and in the center. She is diffusely tender over both knees. There is no specific joint line tenderness. Patellar grind is positive bilaterally. The catheter is soft bilaterally. The popliteal spaces normal. Distally she is neurovascularly intact. She will not tolerate McMurray testing bilaterally;  anterior drawer has a good endpoint bilaterally . MSK: Poor muscle bulk in time the bilateral quadriceps.  Reviewed report of her prior ultrasound. INJECTION: Patient was given informed consent, signed copy in the chart. Appropriate time out was taken. Area prepped and draped in usual sterile fashion. One cc of kenalog plus  4 cc of lidocaine was injected into the bilateral knee using a(n) anterior approach. The patient tolerated the procedure well. There were no complications. Post procedure instructions were given.     Assessment & Plan:  Bilateral knee pain. Also reviewed her review x-ray of her knee. It  was a non-standing view so I couldn't really assess joint space but there is not a lot of sclerosis or osteophyte formation. I think most of her pain is from patellofemoral syndrome probably from her medially tracking patella. She says she's been to physical therapy before it wasn't really excited about doing it again. However there was little other option. She has poor quadricep muscle development bilaterally. After long discussion spending greater than 50% of our 40 minute office visit in counseling and education regarding patellofemoral syndrome she agreed to restart physical therapy and she requested bilateral corticosteroid injections today. She also requested pain medication. I do not have a list of her medicines that she does not know them. He states he been on tramadol in the past. I do not have comfortable starting her on tramadol for any type of narcotic given her problem list. I did review her creatinine which was done last year it was normal so I will start her on diclofenac. She will followup with her regular provider. She is aware that I would not recommend chronic every 3 months knee injections with corticosteroids. I think the answer for her knee pain is in physical therapy.

## 2011-02-25 ENCOUNTER — Ambulatory Visit: Payer: Self-pay | Attending: Family Medicine | Admitting: Physical Therapy

## 2011-03-03 ENCOUNTER — Ambulatory Visit: Payer: Self-pay

## 2011-03-12 ENCOUNTER — Ambulatory Visit: Payer: Self-pay | Admitting: Physical Therapy

## 2011-03-14 LAB — GC/CHLAMYDIA PROBE AMP, GENITAL: GC Probe Amp, Genital: NEGATIVE

## 2011-03-14 LAB — WET PREP, GENITAL

## 2011-03-14 LAB — URINALYSIS, ROUTINE W REFLEX MICROSCOPIC
Hgb urine dipstick: NEGATIVE
Protein, ur: NEGATIVE
Urobilinogen, UA: 1

## 2011-03-14 LAB — POCT PREGNANCY, URINE: Preg Test, Ur: NEGATIVE

## 2011-03-17 ENCOUNTER — Other Ambulatory Visit: Payer: Self-pay | Admitting: Internal Medicine

## 2011-03-17 DIAGNOSIS — M549 Dorsalgia, unspecified: Secondary | ICD-10-CM

## 2011-03-31 ENCOUNTER — Other Ambulatory Visit: Payer: Self-pay

## 2011-04-01 LAB — URINALYSIS, ROUTINE W REFLEX MICROSCOPIC
Bilirubin Urine: NEGATIVE
Nitrite: NEGATIVE
Protein, ur: NEGATIVE
Specific Gravity, Urine: 1.014
Urobilinogen, UA: 0.2

## 2011-04-01 LAB — COMPREHENSIVE METABOLIC PANEL
ALT: 10
AST: 17
Albumin: 4.1
CO2: 27
Calcium: 10.2
Creatinine, Ser: 1.75 — ABNORMAL HIGH
GFR calc Af Amer: 39 — ABNORMAL LOW
GFR calc non Af Amer: 32 — ABNORMAL LOW
Sodium: 138
Total Protein: 7.8

## 2011-04-01 LAB — DIFFERENTIAL
Eosinophils Absolute: 0
Eosinophils Relative: 0
Lymphocytes Relative: 23
Lymphs Abs: 1.1
Monocytes Absolute: 0.3
Monocytes Relative: 7

## 2011-04-01 LAB — CBC
MCHC: 34.4
MCV: 86
Platelets: 255
RBC: 4.19

## 2011-04-01 LAB — LIPASE, BLOOD: Lipase: 21

## 2011-04-07 ENCOUNTER — Ambulatory Visit: Payer: Self-pay | Admitting: Family Medicine

## 2011-06-20 ENCOUNTER — Emergency Department (HOSPITAL_COMMUNITY)
Admission: EM | Admit: 2011-06-20 | Discharge: 2011-06-21 | Disposition: A | Discharge status: Other Acute Inpatient Hospital | Payer: Self-pay | Attending: Emergency Medicine | Admitting: Emergency Medicine

## 2011-06-20 ENCOUNTER — Encounter (HOSPITAL_COMMUNITY): Payer: Self-pay | Admitting: *Deleted

## 2011-06-20 DIAGNOSIS — R45851 Suicidal ideations: Secondary | ICD-10-CM

## 2011-06-20 DIAGNOSIS — F1410 Cocaine abuse, uncomplicated: Secondary | ICD-10-CM

## 2011-06-20 DIAGNOSIS — F1010 Alcohol abuse, uncomplicated: Secondary | ICD-10-CM

## 2011-06-20 DIAGNOSIS — I10 Essential (primary) hypertension: Secondary | ICD-10-CM | POA: Insufficient documentation

## 2011-06-20 DIAGNOSIS — R413 Other amnesia: Secondary | ICD-10-CM | POA: Insufficient documentation

## 2011-06-20 DIAGNOSIS — F411 Generalized anxiety disorder: Secondary | ICD-10-CM | POA: Insufficient documentation

## 2011-06-20 HISTORY — DX: Essential (primary) hypertension: I10

## 2011-06-20 HISTORY — DX: Pure hypercholesterolemia, unspecified: E78.00

## 2011-06-20 LAB — RAPID URINE DRUG SCREEN, HOSP PERFORMED
Benzodiazepines: NOT DETECTED
Opiates: NOT DETECTED

## 2011-06-20 LAB — COMPREHENSIVE METABOLIC PANEL
Albumin: 3.5 g/dL (ref 3.5–5.2)
Alkaline Phosphatase: 72 U/L (ref 39–117)
BUN: 16 mg/dL (ref 6–23)
Creatinine, Ser: 0.91 mg/dL (ref 0.50–1.10)
GFR calc Af Amer: 87 mL/min — ABNORMAL LOW (ref >90)
Glucose, Bld: 86 mg/dL (ref 70–99)
Potassium: 4 mEq/L (ref 3.5–5.1)
Total Bilirubin: 0.5 mg/dL (ref 0.3–1.2)
Total Protein: 7 g/dL (ref 6.0–8.3)

## 2011-06-20 LAB — PREGNANCY, URINE: Preg Test, Ur: NEGATIVE

## 2011-06-20 LAB — CBC
HCT: 32.8 % — ABNORMAL LOW (ref 36.0–46.0)
Hemoglobin: 10.8 g/dL — ABNORMAL LOW (ref 12.0–15.0)
MCH: 29.5 pg (ref 26.0–34.0)
MCHC: 32.9 g/dL (ref 30.0–36.0)
MCV: 89.6 fL (ref 78.0–100.0)
RDW: 12.7 % (ref 11.5–15.5)

## 2011-06-20 LAB — ETHANOL: Alcohol, Ethyl (B): 71 mg/dL — ABNORMAL HIGH (ref 0–11)

## 2011-06-20 LAB — POCT PREGNANCY, URINE: Preg Test, Ur: NEGATIVE

## 2011-06-20 MED ORDER — IBUPROFEN 600 MG PO TABS: 600.0000 mg | ORAL_TABLET | Freq: Three times a day (TID) | ORAL | Status: DC | PRN

## 2011-06-20 MED ORDER — FOLIC ACID 1 MG PO TABS
1.0000 mg | ORAL_TABLET | Freq: Every day | ORAL | Status: DC
  Administered 2011-06-20 – 2011-06-21 (×2): 1 mg via ORAL
  Filled 2011-06-20 (×3): qty 1

## 2011-06-20 MED ORDER — LORAZEPAM 1 MG PO TABS: 0.0000 mg | ORAL_TABLET | Freq: Two times a day (BID) | ORAL | Status: DC

## 2011-06-20 MED ORDER — LORAZEPAM 1 MG PO TABS
1.0000 mg | ORAL_TABLET | Freq: Four times a day (QID) | ORAL | Status: DC | PRN
  Administered 2011-06-21: 1 mg via ORAL
  Filled 2011-06-20: qty 1

## 2011-06-20 MED ORDER — THIAMINE HCL 100 MG/ML IJ SOLN: 100.0000 mg | Freq: Every day | INTRAMUSCULAR | Status: DC

## 2011-06-20 MED ORDER — ONDANSETRON HCL 4 MG PO TABS
4.0000 mg | ORAL_TABLET | Freq: Three times a day (TID) | ORAL | Status: DC | PRN
  Administered 2011-06-20: 4 mg via ORAL
  Filled 2011-06-20: qty 1

## 2011-06-20 MED ORDER — VITAMIN B-1 100 MG PO TABS
100.0000 mg | ORAL_TABLET | Freq: Every day | ORAL | Status: DC
  Administered 2011-06-20 – 2011-06-21 (×2): 100 mg via ORAL
  Filled 2011-06-20 (×2): qty 1

## 2011-06-20 MED ORDER — LORAZEPAM 2 MG/ML IJ SOLN: 1.0000 mg | Freq: Four times a day (QID) | INTRAMUSCULAR | Status: DC | PRN

## 2011-06-20 MED ORDER — LORAZEPAM 1 MG PO TABS
0.0000 mg | ORAL_TABLET | Freq: Four times a day (QID) | ORAL | Status: DC
  Administered 2011-06-20: 1 mg via ORAL
  Administered 2011-06-20: 2 mg via ORAL
  Administered 2011-06-21: 1 mg via ORAL
  Filled 2011-06-20: qty 2
  Filled 2011-06-20 (×2): qty 1

## 2011-06-20 MED ORDER — ADULT MULTIVITAMIN W/MINERALS CH
1.0000 | ORAL_TABLET | Freq: Every day | ORAL | Status: DC
  Administered 2011-06-20 – 2011-06-21 (×2): 1 via ORAL
  Filled 2011-06-20 (×2): qty 1

## 2011-06-20 MED ORDER — NICOTINE 21 MG/24HR TD PT24
21.0000 mg | MEDICATED_PATCH | Freq: Every day | TRANSDERMAL | Status: DC
  Administered 2011-06-21: 21 mg via TRANSDERMAL
  Filled 2011-06-20: qty 1

## 2011-06-20 NOTE — ED Notes (Signed)
Report called to [facility]. Spoke with Maureen Swanson Act reports that [employer] reports that pt cannot come at this time due to her BP. EDP Conner made aware no new orders received

## 2011-06-20 NOTE — ED Notes (Signed)
Pt states last drink was this am.

## 2011-06-20 NOTE — ED Notes (Signed)
Spoke with Cyndi RN at [facility] regarding bp, reports that she will let joanne know and that they are working on moving her bed assignment and give this recorder a call back

## 2011-06-20 NOTE — ED Notes (Signed)
Pt reports chronic depression x last month. Reports drinking 4-40oz/day. States crying excessively and feels suicidal. Decreased appetite.

## 2011-06-20 NOTE — ED Notes (Addendum)
BELONGINGS: Pt has one red duffle bag, small black book bag, wanded by security and placed behind triage desk.

## 2011-06-20 NOTE — ED Notes (Signed)
Pt given blue scrubs to change in to, security paged to come and wand pt.
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
## 2011-06-20 NOTE — ED Provider Notes (Addendum)
History     CSN: [id number]  Arrival date & time 06/20/11  0854   First MD Initiated Contact with Patient 06/20/11 [phone]      Chief Complaint  Patient presents with  . Suicidal    (Consider location/radiation/quality/duration/timing/severity/associated sxs/prior treatment) HPI Comments: Patient presents with suicidal thoughts, depression and alcohol abuse. She states that this is over the past 2 days and drinking excessively. She normally drinks about 4 40 ounces of beer daily. Her last drink was this morning. Is a plan to cut her wrists. She denied homicidal ideation. She states she has not used crack for 5 months but used it again last night. Denies any chest pain, shortness of breath, abdominal pain, nausea or vomiting. She states compliance with her Wellbutrin.  The history is provided by the patient.    Past Medical History  Diagnosis Date  . Diabetes mellitus   . Hypertension   . Asthma   . High cholesterol     No past surgical history on file.  No family history on file.  History  Substance Use Topics  . Smoking status: Never Smoker   . Smokeless tobacco: Never Used  . Alcohol Use: Yes     drinks 4- 40oz/day    OB History    Grav Para Term Preterm Abortions TAB SAB Ect Mult Living                  Review of Systems  Constitutional: Positive for activity change and appetite change.  HENT: Negative for congestion and rhinorrhea.   Respiratory: Negative for cough, chest tightness and shortness of breath.   Cardiovascular: Negative for chest pain.  Gastrointestinal: Negative for vomiting and abdominal pain.  Genitourinary: Negative for dysuria and hematuria.  Skin: Negative for rash.  Neurological: Negative for headaches.  Psychiatric/Behavioral: Positive for suicidal ideas, dysphoric mood and decreased concentration. The patient is nervous/anxious.     Allergies  Penicillins  Home Medications   Current Outpatient Rx  Name Route Sig Dispense Refill  .  ARIPIPRAZOLE 5 MG PO TABS Oral Take 5 mg by mouth daily.      . ATENOLOL 50 MG PO TABS Oral Take 50 mg by mouth daily.      . BUPROPION HCL ER (SR) 150 MG PO TB12 Oral Take 150 mg by mouth daily.      Marland Kitchen CITALOPRAM HYDROBROMIDE 20 MG PO TABS Oral Take 20 mg by mouth daily.      Marland Kitchen CLINDAMYCIN HCL 150 MG PO CAPS Oral Take 300 mg by mouth every 6 (six) hours.      Marland Kitchen FLUTICASONE-SALMETEROL 250-50 MCG/DOSE IN AEPB Inhalation Inhale 1 puff into the lungs every 12 (twelve) hours.      Marland Kitchen HYDROCODONE-ACETAMINOPHEN 5-500 MG PO TABS Oral Take 1 tablet by mouth every 6 (six) hours as needed. For pain     . LAMOTRIGINE 150 MG PO TABS Oral Take 225 mg by mouth daily.      Marland Kitchen METHOCARBAMOL 500 MG PO TABS Oral Take 500 mg by mouth every 6 (six) hours as needed. For pain     . ADULT MULTIVITAMIN W/MINERALS CH Oral Take 1 tablet by mouth daily.      Marland Kitchen PANTOPRAZOLE SODIUM 40 MG PO TBEC Oral Take 40 mg by mouth daily.      . SERTRALINE HCL 100 MG PO TABS Oral Take 100 mg by mouth daily.      . TRAZODONE HCL 150 MG PO TABS Oral Take 300 mg  by mouth at bedtime.      . TRIAMTERENE-HCTZ 37.5-25 MG PO CAPS Oral Take 1 capsule by mouth every morning.      Marland Kitchen PRESCRIPTION MEDICATION Intramuscular Inject 1 mL into the muscle. Patient states that she gets a corticosteroid injection in knees every 3 months from doctors office.       BP 154/106  Pulse 88  Temp(Src) 97.9 F (36.6 C) (Oral)  Resp 19  Ht 5\' 4"  (1.626 m)  SpO2 100%  Physical Exam  Constitutional: She is oriented to person, place, and time. She appears well-developed and well-nourished. No distress.       anxious  HENT:  Head: Normocephalic and atraumatic.  Mouth/Throat: Oropharynx is clear and moist. No oropharyngeal exudate.  Eyes: Conjunctivae are normal. Pupils are equal, round, and reactive to light.  Neck: Normal range of motion. Neck supple.  Cardiovascular: Normal rate, regular rhythm and normal heart sounds.   Pulmonary/Chest: Effort normal and  breath sounds normal. No respiratory distress.  Abdominal: Soft. There is no tenderness. There is no rebound and no guarding.  Musculoskeletal: Normal range of motion. She exhibits no edema and no tenderness.  Neurological: She is alert and oriented to person, place, and time. No cranial nerve deficit.  Skin: Skin is warm.  Psychiatric: Her mood appears anxious. Cognition and memory are impaired. She expresses impulsivity and inappropriate judgment. She exhibits abnormal recent memory and abnormal remote memory.    ED Course  Procedures (including critical care time)  Labs Reviewed  CBC - Abnormal; Notable for the following:    RBC 3.66 (*)    Hemoglobin 10.8 (*)    HCT 32.8 (*)    All other components within normal limits  COMPREHENSIVE METABOLIC PANEL - Abnormal; Notable for the following:    GFR calc non Af Amer 75 (*)    GFR calc Af Amer 87 (*)    All other components within normal limits  ETHANOL - Abnormal; Notable for the following:    Alcohol, Ethyl (B) 71 (*)    All other components within normal limits  URINE RAPID DRUG SCREEN (HOSP PERFORMED) - Abnormal; Notable for the following:    Cocaine POSITIVE (*)    All other components within normal limits  PREGNANCY, URINE  POCT PREGNANCY, URINE  POCT PREGNANCY, URINE   No results found.   No diagnosis found.    MDM  Depression with suicidal ideation, alcohol abuse.  Vital signs stable. Exam benign.  Obtain screening labs, initiate alcohol withdrawal protocol discuss with act team        Glynn Octave, MD 06/20/11 1524  Patient accepted in transfer to behavioral health. Diagnosis suicidal ideation. Excepting physician Dr. Ancil Linsey, MD 06/21/11 [phone]

## 2011-06-20 NOTE — ED Notes (Signed)
Pt states she has been depressed and has been having thoughts of hurting her self by cutting or stabbing herself.  States she is having AH telling her to hurt herself.  Denies VH and HI.

## 2011-06-21 ENCOUNTER — Inpatient Hospital Stay (HOSPITAL_COMMUNITY)
Admission: AD | Admit: 2011-06-21 | Discharge: 2011-06-25 | DRG: 885 | Disposition: A | Discharge status: Home / Self Care | Payer: No Typology Code available for payment source | Source: Ambulatory Visit | Attending: Psychiatry | Admitting: Psychiatry

## 2011-06-21 ENCOUNTER — Encounter (HOSPITAL_COMMUNITY): Payer: Self-pay | Admitting: *Deleted

## 2011-06-21 DIAGNOSIS — Z79899 Other long term (current) drug therapy: Secondary | ICD-10-CM

## 2011-06-21 DIAGNOSIS — Z880 Allergy status to penicillin: Secondary | ICD-10-CM

## 2011-06-21 DIAGNOSIS — F333 Major depressive disorder, recurrent, severe with psychotic symptoms: Principal | ICD-10-CM

## 2011-06-21 DIAGNOSIS — A599 Trichomoniasis, unspecified: Secondary | ICD-10-CM

## 2011-06-21 DIAGNOSIS — K219 Gastro-esophageal reflux disease without esophagitis: Secondary | ICD-10-CM

## 2011-06-21 DIAGNOSIS — N390 Urinary tract infection, site not specified: Secondary | ICD-10-CM

## 2011-06-21 DIAGNOSIS — J45909 Unspecified asthma, uncomplicated: Secondary | ICD-10-CM

## 2011-06-21 DIAGNOSIS — F1410 Cocaine abuse, uncomplicated: Secondary | ICD-10-CM | POA: Diagnosis present

## 2011-06-21 DIAGNOSIS — R45851 Suicidal ideations: Secondary | ICD-10-CM

## 2011-06-21 DIAGNOSIS — I10 Essential (primary) hypertension: Secondary | ICD-10-CM

## 2011-06-21 DIAGNOSIS — F1020 Alcohol dependence, uncomplicated: Secondary | ICD-10-CM | POA: Diagnosis present

## 2011-06-21 DIAGNOSIS — Z8659 Personal history of other mental and behavioral disorders: Secondary | ICD-10-CM

## 2011-06-21 DIAGNOSIS — E7800 Pure hypercholesterolemia, unspecified: Secondary | ICD-10-CM

## 2011-06-21 DIAGNOSIS — F1994 Other psychoactive substance use, unspecified with psychoactive substance-induced mood disorder: Secondary | ICD-10-CM

## 2011-06-21 DIAGNOSIS — E119 Type 2 diabetes mellitus without complications: Secondary | ICD-10-CM

## 2011-06-21 HISTORY — DX: Depression, unspecified: F32.A

## 2011-06-21 HISTORY — DX: Major depressive disorder, single episode, unspecified: F32.9

## 2011-06-21 MED ORDER — LOPERAMIDE HCL 2 MG PO CAPS: 2.0000 mg | ORAL_CAPSULE | ORAL | Status: AC | PRN

## 2011-06-21 MED ORDER — VITAMIN B-1 100 MG PO TABS
100.0000 mg | ORAL_TABLET | Freq: Every day | ORAL | Status: DC
  Administered 2011-06-22 – 2011-06-25 (×4): 100 mg via ORAL
  Filled 2011-06-21 (×5): qty 1

## 2011-06-21 MED ORDER — TRIAMTERENE-HCTZ 37.5-25 MG PO TABS
1.0000 | ORAL_TABLET | Freq: Every day | ORAL | Status: DC
  Administered 2011-06-21 – 2011-06-25 (×5): 1 via ORAL
  Filled 2011-06-21 (×7): qty 1

## 2011-06-21 MED ORDER — CHLORDIAZEPOXIDE HCL 25 MG PO CAPS
25.0000 mg | ORAL_CAPSULE | Freq: Four times a day (QID) | ORAL | Status: DC | PRN
  Administered 2011-06-21 – 2011-06-23 (×5): 25 mg via ORAL
  Filled 2011-06-21 (×5): qty 1

## 2011-06-21 MED ORDER — SERTRALINE HCL 100 MG PO TABS
100.0000 mg | ORAL_TABLET | Freq: Every day | ORAL | Status: DC
  Administered 2011-06-21 – 2011-06-23 (×3): 100 mg via ORAL
  Filled 2011-06-21 (×6): qty 1

## 2011-06-21 MED ORDER — THIAMINE HCL 100 MG/ML IJ SOLN: 100.0000 mg | Freq: Once | INTRAMUSCULAR | Status: DC

## 2011-06-21 MED ORDER — BUPROPION HCL ER (SR) 150 MG PO TB12
150.0000 mg | ORAL_TABLET | Freq: Every day | ORAL | Status: DC
  Administered 2011-06-21 – 2011-06-23 (×3): 150 mg via ORAL
  Filled 2011-06-21 (×6): qty 1

## 2011-06-21 MED ORDER — LAMOTRIGINE 25 MG PO TABS
225.0000 mg | ORAL_TABLET | Freq: Every day | ORAL | Status: DC
  Administered 2011-06-21 – 2011-06-23 (×3): 225 mg via ORAL
  Filled 2011-06-21 (×4): qty 1
  Filled 2011-06-21: qty 2
  Filled 2011-06-21: qty 1

## 2011-06-21 MED ORDER — ARIPIPRAZOLE 5 MG PO TABS
5.0000 mg | ORAL_TABLET | Freq: Every day | ORAL | Status: DC
  Administered 2011-06-21 – 2011-06-25 (×5): 5 mg via ORAL
  Filled 2011-06-21 (×7): qty 1

## 2011-06-21 MED ORDER — HYDROXYZINE HCL 25 MG PO TABS
25.0000 mg | ORAL_TABLET | Freq: Four times a day (QID) | ORAL | Status: AC | PRN
  Administered 2011-06-23: 25 mg via ORAL

## 2011-06-21 MED ORDER — ONDANSETRON 4 MG PO TBDP
4.0000 mg | ORAL_TABLET | Freq: Four times a day (QID) | ORAL | Status: AC | PRN
  Administered 2011-06-22: 4 mg via ORAL
  Filled 2011-06-21: qty 1

## 2011-06-21 MED ORDER — ACETAMINOPHEN 325 MG PO TABS
650.0000 mg | ORAL_TABLET | Freq: Four times a day (QID) | ORAL | Status: DC | PRN
  Administered 2011-06-21: 650 mg via ORAL

## 2011-06-21 MED ORDER — ADULT MULTIVITAMIN W/MINERALS CH
1.0000 | ORAL_TABLET | Freq: Every day | ORAL | Status: DC
  Administered 2011-06-21 – 2011-06-25 (×5): 1 via ORAL
  Filled 2011-06-21 (×4): qty 1

## 2011-06-21 MED ORDER — FLUTICASONE-SALMETEROL 250-50 MCG/DOSE IN AEPB
1.0000 | INHALATION_SPRAY | Freq: Two times a day (BID) | RESPIRATORY_TRACT | Status: DC
  Administered 2011-06-21 – 2011-06-25 (×8): 1 via RESPIRATORY_TRACT
  Filled 2011-06-21 (×2): qty 14

## 2011-06-21 MED ORDER — TRAZODONE HCL 150 MG PO TABS
300.0000 mg | ORAL_TABLET | Freq: Every day | ORAL | Status: DC
  Administered 2011-06-21 – 2011-06-22 (×2): 300 mg via ORAL
  Filled 2011-06-21 (×4): qty 2

## 2011-06-21 MED ORDER — PANTOPRAZOLE SODIUM 40 MG PO TBEC
40.0000 mg | DELAYED_RELEASE_TABLET | Freq: Every day | ORAL | Status: DC
  Administered 2011-06-21 – 2011-06-25 (×5): 40 mg via ORAL
  Filled 2011-06-21 (×5): qty 1

## 2011-06-21 MED ORDER — ATENOLOL 50 MG PO TABS
50.0000 mg | ORAL_TABLET | Freq: Every day | ORAL | Status: DC
  Administered 2011-06-21 – 2011-06-25 (×5): 50 mg via ORAL
  Filled 2011-06-21 (×7): qty 1

## 2011-06-21 MED ORDER — ALUM & MAG HYDROXIDE-SIMETH 200-200-20 MG/5ML PO SUSP: 30.0000 mL | ORAL | Status: DC | PRN

## 2011-06-21 MED ORDER — METHOCARBAMOL 500 MG PO TABS
500.0000 mg | ORAL_TABLET | Freq: Four times a day (QID) | ORAL | Status: DC | PRN
  Administered 2011-06-21 – 2011-06-25 (×8): 500 mg via ORAL
  Filled 2011-06-21 (×8): qty 1

## 2011-06-21 MED ORDER — CHLORDIAZEPOXIDE HCL 25 MG PO CAPS
50.0000 mg | ORAL_CAPSULE | Freq: Once | ORAL | Status: AC
  Administered 2011-06-21: 50 mg via ORAL
  Filled 2011-06-21: qty 2

## 2011-06-21 MED ORDER — MAGNESIUM HYDROXIDE 400 MG/5ML PO SUSP
30.0000 mL | Freq: Every day | ORAL | Status: DC | PRN
  Administered 2011-06-23: 30 mL via ORAL

## 2011-06-21 MED ORDER — CITALOPRAM HYDROBROMIDE 20 MG PO TABS
20.0000 mg | ORAL_TABLET | Freq: Every day | ORAL | Status: DC
  Administered 2011-06-21 – 2011-06-23 (×3): 20 mg via ORAL
  Filled 2011-06-21 (×6): qty 1

## 2011-06-21 NOTE — Progress Notes (Addendum)
06-20-10 nursing admission note: pt came to [facility] for ETOH detox. She had no overt withdrawal sign or symptoms. She had a positive UDS for cocaine and has a hx of schizophrenia. On adm she stated she was having auditory and visual hallucinations. She was able to contract for si/hi. She stated she was having voices telling her to cut her stomach and stated the her visual hallucination were "floater".  She stated she is allergic to PCN. Medical problems are diabetes, right knee surgery 2 yrs ago, htn and  sciatica in her back. She smokes 1 ppd and wears glasses. She has a hx of herpes, is post menopausal and had 2 vaginal births. Adm is incomplete except for the care plan. reThe pt was cooperative and escorted to the 400 hall.   Contact person: ronald davis at home ph # [phone] or cell ph # [phone]

## 2011-06-21 NOTE — Progress Notes (Signed)
06-21-11 admisssion was completed except for the care plan. Report was given to brook m, .Marland KitchenMarland KitchenMarland Kitchen

## 2011-06-21 NOTE — ED Notes (Signed)
Spoke with Ophelia Charter AC at [facility] was asked to obtain a new set of VS and CIWA this was reported to her by this recorder was advised that pt's bp was still too high for pt to be brought to their unit. Pt medicated with PRN dose of ativan. Pt reports that she has not been taking her BP medications has been out of them for approx 2 weeks. Will notify EDP

## 2011-06-21 NOTE — ED Notes (Signed)
EDP Maureen Swanson made aware reports to notify [facility]

## 2011-06-21 NOTE — Progress Notes (Signed)
[facility] Family/Significant Other Suicide Prevention Education  Suicide Prevention Education:  Contact Attempts: Windy Fast (ficance832 [phone], (name of family member/significant other) has been identified by the patient as the family member/significant other with whom the patient will be residing, and identified as the person(s) who will aid the patient in the event of a mental health crisis.  With written consent from the patient, two attempts were made to provide suicide prevention education, prior to and/or following the patient's discharge.  We were unsuccessful in providing suicide prevention education.  A suicide education pamphlet was given to the patient to share with family/significant other.  Date and time of first attempt:05/20/12 at 5:21 PM   [facility] 06/21/2011, 5:21 PM

## 2011-06-21 NOTE — ED Notes (Signed)
Per Berna Spare Act reports that pt is now accepted reports that the last BP was good enough.  Also reports to send pt after 0730 am. EDP Dierdre Highman made aware.

## 2011-06-21 NOTE — ED Notes (Signed)
Patient discharged via security to [facility] 

## 2011-06-21 NOTE — Tx Team (Signed)
Initial Interdisciplinary Treatment Plan  PATIENT STRENGTHS: (choose at least two) Average or above average intelligence Capable of independent living Communication skills Motivation for treatment/growth Supportive family/friends  PATIENT STRESSORS: Financial difficulties Health problems Medication change or noncompliance Substance abuse   PROBLEM LIST: Problem List/Patient Goals Date to be addressed Date deferred Reason deferred Estimated date of resolution  Polysubstance dependence/abuse 06-21-11     (cocaine and etoh)            Hx of schizophrenia 06-21-11           Positive for auditory/visual hallucinations                          DISCHARGE CRITERIA:  Motivation to continue treatment in a less acute level of care Reduction of life-threatening or endangering symptoms to within safe limits Verbal commitment to aftercare and medication compliance Withdrawal symptoms are absent or subacute and managed without 24-hour nursing intervention  PRELIMINARY DISCHARGE PLAN: Attend aftercare/continuing care group Attend 12-step recovery group Return to previous living arrangement  PATIENT/FAMIILY INVOLVEMENT: This treatment plan has been presented to and reviewed with the patient, Maureen Swanson, and/or family member.  The patient and family have been given the opportunity to ask questions and make suggestions.  Valente David 06/21/2011, 3:01 PM

## 2011-06-21 NOTE — ED Notes (Signed)
Huntley Dec RN,AC made aware per EDP Cook's request

## 2011-06-22 DIAGNOSIS — F1010 Alcohol abuse, uncomplicated: Secondary | ICD-10-CM

## 2011-06-22 DIAGNOSIS — F1910 Other psychoactive substance abuse, uncomplicated: Secondary | ICD-10-CM

## 2011-06-22 DIAGNOSIS — F1994 Other psychoactive substance use, unspecified with psychoactive substance-induced mood disorder: Secondary | ICD-10-CM

## 2011-06-22 MED ORDER — INFLUENZA VIRUS VACC SPLIT PF IM SUSP
0.5000 mL | INTRAMUSCULAR | Status: AC
  Administered 2011-06-23: 0.5 mL via INTRAMUSCULAR

## 2011-06-22 MED ORDER — TETANUS-DIPHTHERIA TOXOIDS TD 5-2 LFU IM INJ
0.5000 mL | INJECTION | Freq: Once | INTRAMUSCULAR | Status: AC
  Administered 2011-06-22: 0.5 mL via INTRAMUSCULAR

## 2011-06-22 NOTE — Progress Notes (Signed)
Patient ID: Maureen Swanson, female   DOB: 14-Nov-1965, 46 y.o.   MRN: [id number] Was out and about on hall this evening, came to med window, but had slept through group tonight.  Stated has chronic back pain, was given robaxin for spasms.  Also c/o feeling a little anxious, admitted to drinking 4 40 oz beers/day, and was given librium for w/d sx.  Has been cooperative, rather quiet but makes fair eye contact when in conversation.  Was not noted interacting with peers tonight. Will continue to monitor.

## 2011-06-22 NOTE — Progress Notes (Signed)
Patient ID: Maureen Swanson, female   DOB: [birth year], 46 y.o.   MRN: [id number]  [facility] Group Notes:  (Counselor/Nursing/MHT/Case Management/Adjunct)  06/22/2011 11 AM  Type of Therapy:  Group Therapy  Participation Level:  Did Not Attend  Neila Gear

## 2011-06-22 NOTE — Progress Notes (Signed)
[facility] Adult Inpatient Family/Significant Other Suicide Prevention Education  Suicide Prevention Education:  Education Completed; [name],  (fiance) [phone] has been identified by the patient as the family member/significant other with whom the patient will be residing, and identified as the person(s) who will aid the patient in the event of a mental health crisis (suicidal ideations/suicide attempt).  With written consent from the patient, the family member/significant other has been provided the following suicide prevention education, prior to the and/or following the discharge of the patient.  The suicide prevention education provided includes the following:  Suicide risk factors  Suicide prevention and interventions  National Suicide Hotline telephone number  [facility] assessment telephone number  [facility] Emergency Assistance 911  [facility] and/or Residential Mobile Crisis Unit telephone number  Request made of family/significant other to:  Remove weapons (e.g., guns, rifles, knives), all items previously/currently identified as safety concern.    Remove drugs/medications (over-the-counter, prescriptions, illicit drugs), all items previously/currently identified as a safety concern.  Maureen Swanson said that there were no guns in the home and the only knives where in the kitchen. Maureen Swanson also disclosed that Maureen Swanson has verbalized that she has wanted to kill herself many times.  The family member/significant other verbalizes understanding of the suicide prevention education information provided.  The family member/significant other agrees to remove the items of safety concern listed above.  Maureen Swanson 06/22/2011, 3:53 PM

## 2011-06-22 NOTE — Progress Notes (Signed)
Pt has been up intermittently during the day today, limited interaction or participation in various milieu activities, endorsed feelings of depression and spoke about how she has been drinking for much of her life since the age of 38, pt reports having feelings of anxiety, has some tremors and feelings of malaise and has some passive suicidal thoughts, pt has stated that she wants to get clean and try to do things other than drinking, pt has received medications today without incident, support provided, will continue to monitor

## 2011-06-22 NOTE — H&P (Signed)
Psychiatric Admission Assessment Adult  Patient Identification:  Maureen Swanson Date of Evaluation:  06/22/2011 Chief Complaint:  Depressive Disorder NOS, ETOH DEPENDENCE  History of Present Illness:: This is a 45 year old African-American female. Admitted to [facility] from the [facility] ED. Patient reports, I am here because of my alcoholism, drug use and depression. I am an anxious person. I have a lot of anxiety issues. I drink and use drugs because I feel calm after I use them. However, I will get very sick after I drank and use drug, that feeling is part of my depression as well. I have had drug and alcohol problems since my teenage years. I was in this hospital many years ago for the same problems. I have also gone through rehabilitation for my alcohol and drug use. The treatments helped me for a while, then I will relapse and it will get worse again. I am hoping that this time, I will learn something to help me stay drug and alcohol free"  Mood Symptoms:  Anhedonia Depression Helplessness SI Depression Symptoms:  depressed mood, psychomotor agitation, feelings of worthlessness/guilt and anxiety (Hypo) Manic Symptoms:   Elevated Mood:  No Irritable Mood:  Yes Grandiosity:  No Distractibility:  No Labiality of Mood:  Yes Delusions:  No Hallucinations:  No presently, has history of. Impulsivity:  No Sexually Inappropriate Behavior:  No Financial Extravagance:  No Flight of Ideas:  No  Anxiety Symptoms: Excessive Worry:  No Panic Symptoms:  No Agoraphobia:  No Obsessive Compulsive: No  Symptoms: None Specific Phobias:  No Social Anxiety:  No  Psychotic Symptoms:  Hallucinations:  Auditory Visual (Hx.) Delusions:  No Paranoia:  No   Ideas of Reference:  No  PTSD Symptoms: Ever had a traumatic exposure:  No Had a traumatic exposure in the last month:  No Re-experiencing:  None Hypervigilance:  No Hyperarousal:  None Avoidance:  None  Traumatic Brain Injury:   Denies report of.  Past Psychiatric History: Diagnosis: Polysubstance abuse and dependency, Schizophrenia Major depressive disorder.  Hospitalizations:[facility], "have been through drug & alcohol rehab. In the past"  Outpatient Care: [facility]  Substance Abuse Care: "I have been through a number of rehab places in the past, including this hospital"  Self-Mutilation: "I used to cut my wrists"  Suicidal Attempts:  Violent Behaviors:Denies report   Past Medical History:   Past Medical History  Diagnosis Date  . Diabetes mellitus   . Hypertension   . Asthma   . High cholesterol   . Depression    History of Loss of Consciousness:  No Seizure History:  No Cardiac History:  Yes Allergies:   Allergies  Allergen Reactions  . Penicillins Hives   Current Medications:  Current Facility-Administered Medications  Medication Dose Route Frequency Provider Last Rate Last Dose  . acetaminophen (TYLENOL) tablet 650 mg  650 mg Oral Q6H PRN Edwin Walker   650 mg at 06/21/11 1423  . alum & mag hydroxide-simeth (MAALOX/MYLANTA) 200-200-20 MG/5ML suspension 30 mL  30 mL Oral Q4H PRN Orson Aloe      . ARIPiprazole (ABILIFY) tablet 5 mg  5 mg Oral Daily Edwin Walker   5 mg at 06/22/11 0849  . atenolol (TENORMIN) tablet 50 mg  50 mg Oral Daily Edwin Walker   50 mg at 06/22/11 0849  . buPROPion [facility] SR) 12 hr tablet 150 mg  150 mg Oral Daily Edwin Walker   150 mg at 06/22/11 0849  . chlordiazePOXIDE (LIBRIUM) capsule 25  mg  25 mg Oral Q6H PRN Edwin Walker   25 mg at 06/22/11 0410  . chlordiazePOXIDE (LIBRIUM) capsule 50 mg  50 mg Oral Once Edwin Walker   50 mg at 06/21/11 1418  . citalopram (CELEXA) tablet 20 mg  20 mg Oral Daily Edwin Walker   20 mg at 06/22/11 0849  . Fluticasone-Salmeterol (ADVAIR) 250-50 MCG/DOSE inhaler 1 puff  1 puff Inhalation BID Edwin Walker   1 puff at 06/22/11 0851  . hydrOXYzine (ATARAX/VISTARIL) tablet 25 mg  25 mg Oral Q6H PRN Orson Aloe      . lamoTRIgine  (LAMICTAL) 225 mg  225 mg Oral Daily Edwin Walker   225 mg at 06/22/11 0849  . loperamide (IMODIUM) capsule 2-4 mg  2-4 mg Oral PRN Orson Aloe      . magnesium hydroxide (MILK OF MAGNESIA) suspension 30 mL  30 mL Oral Daily PRN Orson Aloe      . methocarbamol (ROBAXIN) tablet 500 mg  500 mg Oral Q6H PRN Edwin Walker   500 mg at 06/22/11 0411  . mulitivitamin with minerals tablet 1 tablet  1 tablet Oral Daily Edwin Walker   1 tablet at 06/22/11 0850  . ondansetron (ZOFRAN-ODT) disintegrating tablet 4 mg  4 mg Oral Q6H PRN Orson Aloe      . pantoprazole (PROTONIX) EC tablet 40 mg  40 mg Oral Q1200 Edwin Walker   40 mg at 06/21/11 1817  . sertraline (ZOLOFT) tablet 100 mg  100 mg Oral Daily Edwin Walker   100 mg at 06/22/11 0850  . thiamine (B-1) injection 100 mg  100 mg Intramuscular Once Orson Aloe      . thiamine (VITAMIN B-1) tablet 100 mg  100 mg Oral Daily Edwin Walker   100 mg at 06/22/11 0851  . traZODone (DESYREL) tablet 300 mg  300 mg Oral QHS Edwin Walker   300 mg at 06/21/11 2215  . triamterene-hydrochlorothiazide (MAXZIDE-25) 37.5-25 MG per tablet 1 each  1 each Oral Daily Orson Aloe   1 each at 06/22/11 2130   Facility-Administered Medications Ordered in Other Encounters  Medication Dose Route Frequency Provider Last Rate Last Dose  . DISCONTD: folic acid (FOLVITE) tablet 1 mg  1 mg Oral Daily Glynn Octave, MD   1 mg at 06/21/11 0935  . DISCONTD: ibuprofen (ADVIL,MOTRIN) tablet 600 mg  600 mg Oral Q8H PRN Glynn Octave, MD      . DISCONTD: LORazepam (ATIVAN) injection 1 mg  1 mg Intravenous Q6H PRN Glynn Octave, MD      . DISCONTD: LORazepam (ATIVAN) tablet 0-4 mg  0-4 mg Oral Q6H Glynn Octave, MD   1 mg at 06/21/11 0332  . DISCONTD: LORazepam (ATIVAN) tablet 0-4 mg  0-4 mg Oral Q12H Glynn Octave, MD      . DISCONTD: LORazepam (ATIVAN) tablet 1 mg  1 mg Oral Q6H PRN Glynn Octave, MD   1 mg at 06/21/11 0046  . DISCONTD: mulitivitamin with minerals tablet 1  tablet  1 tablet Oral Daily Glynn Octave, MD   1 tablet at 06/21/11 0931  . DISCONTD: nicotine (NICODERM CQ - dosed in mg/24 hours) patch 21 mg  21 mg Transdermal Daily Glynn Octave, MD   21 mg at 06/21/11 0934  . DISCONTD: ondansetron (ZOFRAN) tablet 4 mg  4 mg Oral Q8H PRN Glynn Octave, MD   4 mg at 06/20/11 1112  . DISCONTD: thiamine (B-1) injection 100 mg  100 mg Intravenous Daily Glynn Octave, MD      .  DISCONTD: thiamine (VITAMIN B-1) tablet 100 mg  100 mg Oral Daily Glynn Octave, MD   100 mg at 06/21/11 1191    Previous Psychotropic Medications:  Medication Dose                        Substance Abuse History in the last 12 months: Substance Age of 1st Use Last Use Amount Specific Type  Nicotine 17 Prior to hospital 1 pack daily   Cigarettes  Alcohol 21 Prior to hosp    Cannabis Denies use     Opiates Denies use     Cocaine 15 Prior to hosp    Methamphetamines Denies use     LSD Denies use     Ecstasy Denies use     Benzodiazepines Denies use     Caffeine      Inhalants      Others:                         Medical Consequences of Substance Abuse: Liver damage  Legal Consequences of Substance Abuse: Arrest/jail time  Family Consequences of Substance Abuse: Family discord.  Blackouts:  Yes, "When I drink too much" DT's:  No Withdrawal Symptoms:  None  Social History: Current Place of Residence:  [place] Place of Birth:  [place]  Marital Status:  Single History of Abuse (Emotional/Phsycial/Sexual): None reported Occupational Experiences: Unemployed  Legal History: None reported   Family History:  Alcoholism: mother  Mental Status Examination/Evaluation: Objective:  Appearance: Fairly Groomed  Patent attorney::  Fair  Speech:  Clear and Coherent  Volume:  Normal  Mood:  My mood now is pure sadness"  Affect:  Flat  Thought Process:  Linear  Orientation:  Full  Thought Content:  Hallucinations: None  Suicidal Thoughts:  Yes.   without intent/plan  Homicidal Thoughts:  No  Judgement:  Impaired  Insight:  Poor  Psychomotor Activity:  Normal  Akathisia:  No  Handed:  Right    Assets:  Desire for Improvement           Assessment:   Axis I: Alcohol Abuse and Substance Induced Mood Disorder Axis II: Deferred Axis III:  Past Medical History  Diagnosis Date  . Diabetes mellitus   . Hypertension   . Asthma   . High cholesterol   . Depression    Axis IV: economic problems and occupational problems, polysubstance abuse/dependency  Axis V: 41-50 serious symptoms  AXIS I Substance induced mood disorder, Polysubstance abuse and dependency, Hx. Schizophrenia.  AXIS II Deferred  AXIS III Past Medical History  Diagnosis Date  . Diabetes mellitus   . Hypertension   . Asthma   . High cholesterol   . Depression      AXIS IV economic problems and Polysubsatnce abuse and dependency  AXIS V 41-50 serious symptoms   Treatment Plan/Recommendations: Continue current treatment plan.  Treatment Plan Summary: Daily contact with patient to assess and evaluate symptoms and progress in treatment Medication management  Observation Level/Precautions:  Q 15 minutes checks for safety  Laboratory:  Reviewed ED lab reports on file.  Psychotherapy:  Group  Medications:  See lists  Routine PRN Medications:  Yes  Consultations:    Discharge Concerns: Staying drug and alcohol free  Other:      Armandina Stammer I 1/6/201310:33 AM
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
## 2011-06-23 DIAGNOSIS — F1020 Alcohol dependence, uncomplicated: Secondary | ICD-10-CM | POA: Diagnosis present

## 2011-06-23 DIAGNOSIS — F1410 Cocaine abuse, uncomplicated: Secondary | ICD-10-CM | POA: Diagnosis present

## 2011-06-23 LAB — AMYLASE: Amylase: 56 U/L (ref 0–105)

## 2011-06-23 LAB — URINE MICROSCOPIC-ADD ON

## 2011-06-23 LAB — LIPASE, BLOOD: Lipase: 88 U/L — ABNORMAL HIGH (ref 11–59)

## 2011-06-23 LAB — URINALYSIS, ROUTINE W REFLEX MICROSCOPIC
Bilirubin Urine: NEGATIVE
Protein, ur: NEGATIVE mg/dL
Urobilinogen, UA: 0.2 mg/dL (ref 0.0–1.0)

## 2011-06-23 MED ORDER — LAMOTRIGINE 100 MG PO TABS
200.0000 mg | ORAL_TABLET | Freq: Every day | ORAL | Status: DC
  Administered 2011-06-24: 200 mg via ORAL
  Filled 2011-06-23 (×2): qty 1

## 2011-06-23 MED ORDER — CHLORDIAZEPOXIDE HCL 25 MG PO CAPS
25.0000 mg | ORAL_CAPSULE | ORAL | Status: AC
  Administered 2011-06-23: 25 mg via ORAL
  Filled 2011-06-23: qty 1

## 2011-06-23 MED ORDER — BUPROPION HCL ER (SR) 150 MG PO TB12
150.0000 mg | ORAL_TABLET | ORAL | Status: DC
  Administered 2011-06-23 – 2011-06-25 (×4): 150 mg via ORAL
  Filled 2011-06-23 (×5): qty 1

## 2011-06-23 MED ORDER — CHLORDIAZEPOXIDE HCL 25 MG PO CAPS
25.0000 mg | ORAL_CAPSULE | Freq: Every day | ORAL | Status: DC
  Filled 2011-06-23: qty 1

## 2011-06-23 MED ORDER — CHLORDIAZEPOXIDE HCL 25 MG PO CAPS
25.0000 mg | ORAL_CAPSULE | ORAL | Status: AC
  Administered 2011-06-24 (×3): 25 mg via ORAL
  Filled 2011-06-23 (×3): qty 1

## 2011-06-23 MED ORDER — CHLORDIAZEPOXIDE HCL 25 MG PO CAPS
25.0000 mg | ORAL_CAPSULE | ORAL | Status: DC
  Administered 2011-06-25: 25 mg via ORAL
  Filled 2011-06-23: qty 1

## 2011-06-23 MED ORDER — FLUOXETINE HCL 20 MG PO CAPS
20.0000 mg | ORAL_CAPSULE | Freq: Every day | ORAL | Status: DC
  Administered 2011-06-23 – 2011-06-24 (×2): 20 mg via ORAL
  Filled 2011-06-23 (×4): qty 1

## 2011-06-23 MED ORDER — MIRTAZAPINE 15 MG PO TABS
15.0000 mg | ORAL_TABLET | Freq: Every day | ORAL | Status: DC
  Administered 2011-06-23 – 2011-06-24 (×2): 15 mg via ORAL
  Filled 2011-06-23 (×3): qty 1

## 2011-06-23 NOTE — Progress Notes (Signed)
Patient stated on the self inventory sheet that she sleeps fair, has improving appetite, low energy level, improving attention span.  Rated depression #6, hopelessness #7.  Has experienced tremors, chilling, agitation in past 24 hours.   Has had off/on SI on sheet.   Denied SI while talking to nurse, contracts for safety.  Has experienced lightheadedness, pain, dizziness, headaches, in past 24 hours.  Rated pain goal #8, worst pain #8.  Not sure of discharge plans.  No questions for staff.  Not sure of discharge plans.  No problems taking meds after discharge.

## 2011-06-23 NOTE — Discharge Planning (Signed)
Met with patient in Aftercare Planning Group.   She reports that she lives with her fiance and receives medication management and counseling at [facility], with [employer] as her counselor.  She is trying to get disability, working with Clyda Hurdle, c/o Gaye Pollack.    Asks for rehabilitation after [facility] treatment.  Has previously been at [facility], at [place] (3 years ago), and [facility] (2 years ago).  Prefers to go to [facility].  Case Manager did call and set appointment on 07/01/11 at 8AM for intake at [facility].  Patient has appointment on Tuesday 06/24/11 at [facility] for physical.  Ambrose Mantle, LCSW 06/23/2011, 3:19 PM

## 2011-06-23 NOTE — Tx Team (Signed)
Interdisciplinary Treatment Plan Update (Adult)  Date:  06/23/2011  Time Reviewed:  10:30 AM  Progress in Treatment: Attending groups:  Yes Participating in groups:    Yes Taking medication as prescribed:    Yes Tolerating medication:   Yes, no side effects but complains that Trazadone is not helping her, she is having nightmares Family/Significant other contact made: Grants permission for [place] to talk to fiancee  Patient understands diagnosis:  Yes, appears to have full understanding of diagnosis and symptoms  Discussing patient identified problems/goals with staff:   Yes Medical problems stabilized or resolved:   Yes Denies suicidal/homicidal ideation:  Yes Issues/concerns per patient self-inventory:   None Other:  New problem(s) identified: No, Describe:    Reason for Continuation of Hospitalization: Depression Hallucinations Medication stabilization Withdrawal symptoms  Interventions implemented related to continuation of hospitalization:  Medication monitoring and adjustment, safety checks Q15 min., group therapy, psychoeducation, collateral contact - to the time of discharge  Additional comments:  Will attend 1 AA meeting on 300 hall daily.  Estimated length of stay:  5-6 days  Discharge Plan:  Go to [facility], then home with fiance  New goal(s):  Not applicable  Review of initial/current patient goals per problem list:   1.  Goal(s):  Reduce psychotic symptoms to baseline.  Met:  No  Target date:  By Discharge   As evidenced by:  This morning was hearing voices  2.  Goal(s):  Arrange for rehabilitation facility after discharge.  Met:  No  Target date:  By Discharge   As evidenced by:  Needs to be arranged  3.  Goal(s):  Reduce depression and anxiety from 10 to 3.  Met:  No  Target date:  By Discharge   As evidenced by:  Both at "8" today  4.  Goal(s):  Get through withdrawal safely  Met:  No  Target date:  By Discharge   As evidenced by:  Still  having withdrawal symptoms  Attendees: Patient:  Maureen Swanson  06/23/2011 10:30 AM  Family:     Physician:  Dr. Harvie Heck Readling 06/23/2011 10:30 AM  Nursing:   Robbie Louis, RN 06/23/2011   10:30 AM  Case Manager:  Ambrose Mantle, LCSW 06/23/2011  10:30 AM  Counselor:  Veto Kemps, MT-BC 06/23/2011  10:30 AM  Other:   Quintella Reichert, RN 06/23/2011 11:09 AM   Other:   Lynann Bologna, NP 06/23/2011 11:10 AM   Other:      Other:       Scribe for Treatment Team:   Sarina Ser, 06/23/2011, 10:13 AM

## 2011-06-23 NOTE — Progress Notes (Signed)
Patient ID: Maureen Swanson, female   DOB: March 06, 1966, 46 y.o.   MRN: [id number] Had been sleeping earlier part of shift, didn't come to group but did come out to dayroom for snacks.  Didn't come to med window but went back to room and to bed.  After meds were taken to her, she got back up and came out for more snacks.  Denies w/d sx tonight, quiet, stated was tired, went back to bed, didn't initiate conversation, affect flat, seems depressed.  Will continue to monitor.

## 2011-06-23 NOTE — Progress Notes (Deleted)
[facility] Adult Inpatient Family/Significant Other Suicide Prevention Education  Suicide Prevention Education:  Patient Discharged to Other Healthcare Facility:  Suicide Prevention Education Not Provided: {PT. DISCHARGED TO [facility]:SUICIDE PREVENTION EDUCATION NOT PROVIDED [facility]):  The patient is discharging to another healthcare facility for continuation of treatment.  The patient's medical information, including suicide ideations and risk factors, are a part of the medical information shared with the receiving healthcare facility. Suicide prevention information given to patient.  Domonic Hiscox, [name] 06/23/2011, 3:01 PM

## 2011-06-23 NOTE — Progress Notes (Signed)
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 Alcohol Dependence. Cocaine Abuse - Episodic.   The patient was seen today and reports the following:   Sleep: The patient reports to having difficulty initiating and maintaining sleep.  Appetite: Poor.   Mild>(1-10) >Severe  Hopelessness (1-10): 5  Depression (1-10): 8  Anxiety (1-10): 8   Suicidal Ideation: The patient reports "constant" suicidal ideations but with no plan or intent.  Plan: No  Intent: No  Means: No   Homicidal Ideation: The patient adamantly denies any homicidal ideations today.  Plan: No  Intent: No.  Means: No   Eye Contact: Good.  General Appearance/Behavior: Neat and Casual  Motor Behavior: Slightly slowed.  Speech: Appropriate in rate and volume with no pressuring noted..  Mental Status: Alert and Oriented x 3  Level of Consciousness: Alert  Mood: Severely depressed.  Affect: Essentially flat.  Anxiety: Severe.  Thought Process: Reports auditory hallucinations.  Thought Content: The patient reports auditory hallucinations telling her "I am not worthy and should end my life."  She denies any visual hallucinations or delusional thinking.  Perception: Fair  Judgment: Fair.  Insight: Fair.  Cognition: Orientation time, place and person.  Sleep:  Number of Hours: 5   Vital Signs:Blood pressure 102/72, pulse 95, temperature 98.3 F (36.8 C), temperature source Oral, resp. rate 16, height 5\' 4"  (1.626 m), weight 83.462 kg (184 lb), SpO2 98.00%.  Lab Results: No results found for this or any previous visit (from the past 48 hour(s)).  Treatment Plan Summary:  1. Daily contact with patient to assess and evaluate symptoms and progress in treatment  2. Medication management  3. The patient will deny suicidal ideations or homicidal ideations for 48 hours prior to discharge and have a depression and anxiety rating of 3 or  less. The patient will also deny any auditory or visual hallucinations or delusional thinking or display any manic or hypomanic behaviors.   Plan:  1. Will start Prozac to 20 mgs po q am to begin to address her depressive symptoms. 2. Will continue the medication Wellbutrin SR but will increase the medication to 150 mgs po q am and hs to also address her depressive symptoms. 3. Will discontinue the medication Trazodone and start Remeron at 15 mgs po qhs for sleep and appetite and to further provide antidepressant properties. 4. Will start a Librium taper at 25 mgs po q 8 hours x 1 day, then q 12 hours x 1 day then q am x 1 day to help with her alcohol withdrawal. 5. Will obtain a UA, Serum Lipase and Amylase, TSH, T3 and T4 to further address her metabolic status. 6. Will continue to monitor.  Newell Wafer 06/23/2011, 5:20 PM

## 2011-06-24 LAB — TSH: TSH: 0.509 u[IU]/mL (ref 0.350–4.500)

## 2011-06-24 MED ORDER — CIPROFLOXACIN HCL 500 MG PO TABS: 500.0000 mg | ORAL_TABLET | ORAL | Status: DC

## 2011-06-24 MED ORDER — METRONIDAZOLE 500 MG PO TABS: 500.0000 mg | ORAL_TABLET | ORAL | Status: DC

## 2011-06-24 MED ORDER — METRONIDAZOLE 500 MG PO TABS
500.0000 mg | ORAL_TABLET | ORAL | Status: DC
  Administered 2011-06-24 – 2011-06-25 (×2): 500 mg via ORAL
  Filled 2011-06-24 (×4): qty 1

## 2011-06-24 MED ORDER — CIPROFLOXACIN HCL 500 MG PO TABS
500.0000 mg | ORAL_TABLET | ORAL | Status: DC
  Administered 2011-06-24 – 2011-06-25 (×2): 500 mg via ORAL
  Filled 2011-06-24 (×4): qty 1

## 2011-06-24 MED ORDER — FLUOXETINE HCL 20 MG PO CAPS
40.0000 mg | ORAL_CAPSULE | Freq: Every day | ORAL | Status: DC
  Administered 2011-06-25: 40 mg via ORAL
  Filled 2011-06-24 (×2): qty 2

## 2011-06-24 NOTE — Progress Notes (Signed)
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  Alcohol Dependence.  Cocaine Abuse - Episodic.   The patient was seen today and reports the following:   Sleep: The patient reports to having ongoing difficulty initiating and maintaining sleep but states this is improving.  Appetite: Poor.   Mild>(1-10) >Severe  Hopelessness (1-10): 9  Depression (1-10): 10  Anxiety (1-10): 9   Suicidal Ideation: The patient denies any suicidal ideations today.  Plan: No  Intent: No  Means: No   Homicidal Ideation: The patient adamantly denies any homicidal ideations today.  Plan: No  Intent: No.  Means: No   Eye Contact: Good.  General Appearance/Behavior: Neat and Casual  Motor Behavior: Slightly slowed.  Speech: Appropriate in rate and volume with no pressuring noted..  Mental Status: Alert and Oriented x 3  Level of Consciousness: Alert  Mood: Severely depressed.  Affect: Essentially flat.  Anxiety: Severe.  Thought Process: Reports auditory hallucinations.  Thought Content: The patient reports auditory hallucinations but feels these are lessening in severity.  She denies any visual hallucinations or delusional thinking.  Perception: Fair  Judgment: Fair.  Insight: Fair.  Cognition: Orientation time, place and person.  Sleep:  Number of Hours: 5.25   Vital Signs:Blood pressure 126/86, pulse 92, temperature 98 F (36.7 C), temperature source Oral, resp. rate 16, height 5\' 4"  (1.626 m), weight 83.462 kg (184 lb), SpO2 98.00%.  Lab Results:  Results for orders placed during the hospital encounter of 06/21/11 (from the past 48 hour(s))  URINALYSIS, ROUTINE W REFLEX MICROSCOPIC     Status: Abnormal   Collection Time   06/23/11  6:40 PM      Component Value Range Comment   Color, Urine YELLOW  YELLOW     APPearance CLOUDY (*) CLEAR     Specific Gravity, Urine 1.013  1.005 - 1.030     pH 5.5  5.0 -  8.0     Glucose, UA NEGATIVE  NEGATIVE (mg/dL)    Hgb urine dipstick LARGE (*) NEGATIVE     Bilirubin Urine NEGATIVE  NEGATIVE     Ketones, ur NEGATIVE  NEGATIVE (mg/dL)    Protein, ur NEGATIVE  NEGATIVE (mg/dL)    Urobilinogen, UA 0.2  0.0 - 1.0 (mg/dL)    Nitrite NEGATIVE  NEGATIVE     Leukocytes, UA LARGE (*) NEGATIVE    URINE MICROSCOPIC-ADD ON     Status: Abnormal   Collection Time   06/23/11  6:40 PM      Component Value Range Comment   Squamous Epithelial / LPF FEW (*) RARE     WBC, UA 7-10  <3 (WBC/hpf)    RBC / HPF 11-20  <3 (RBC/hpf)    Bacteria, UA MANY (*) RARE     Urine-Other TRICHOMONAS PRESENT     TSH     Status: Normal   Collection Time   06/23/11  7:45 PM      Component Value Range Comment   TSH 0.509  0.350 - 4.500 (uIU/mL)   T3, FREE     Status: Normal   Collection Time   06/23/11  7:45 PM      Component Value Range Comment   T3, Free 2.4  2.3 - 4.2 (pg/mL)   T4, FREE     Status: Normal   Collection Time   06/23/11  7:45 PM      Component Value Range Comment  Free T4 0.98  0.80 - 1.80 (ng/dL)   AMYLASE     Status: Normal   Collection Time   06/23/11  7:45 PM      Component Value Range Comment   Amylase 56  0 - 105 (U/L)   LIPASE, BLOOD     Status: Abnormal   Collection Time   06/23/11  7:45 PM      Component Value Range Comment   Lipase 88 (*) 11 - 59 (U/L)    Treatment Plan Summary:  1. Daily contact with patient to assess and evaluate symptoms and progress in treatment  2. Medication management  3. The patient will deny suicidal ideations or homicidal ideations for 48 hours prior to discharge and have a depression and anxiety rating of 3 or less. The patient will also deny any auditory or visual hallucinations or delusional thinking or display any manic or hypomanic behaviors.   Plan:  1. Will increase Prozac to 40 mgs po q am to further address her depressive symptoms.  2. Will start Cipro 500 mgs po q am and hs for an acute UTI. 3. Will start Flagyl 500  mgs po q am and hs for Trichomonas. 4. Will continue to monitor.  Maureen Swanson 06/24/2011, 4:25 PM

## 2011-06-24 NOTE — Progress Notes (Signed)
[facility] Adult Inpatient Family/Significant Other Suicide Prevention Education  Suicide Prevention Education:  Education Completed; [name] (fiance ) has been identified by the patient as the family member/significant other with whom the patient will be residing, and identified as the person(s) who will aid the patient in the event of a mental health crisis (suicidal ideations/suicide attempt).  With written consent from the patient, the family member/significant other has been provided the following suicide prevention education, prior to the and/or following the discharge of the patient.  The suicide prevention education provided includes the following:  Suicide risk factors  Suicide prevention and interventions  National Suicide Hotline telephone number  [facility] assessment telephone number  [facility] Emergency Assistance 911  [facility] and/or Residential Mobile Crisis Unit telephone number  Request made of family/significant other to:  Remove weapons (e.g., guns, rifles, knives), all items previously/currently identified as safety concern.    Remove drugs/medications (over-the-counter, prescriptions, illicit drugs), all items previously/currently identified as a safety concern.  The family member/significant other verbalizes understanding of the suicide prevention education information provided.  The family member/significant other agrees to remove the items of safety concern listed above.  Dimitris Shanahan, [name] 06/24/2011, 2:09 PM

## 2011-06-24 NOTE — Progress Notes (Signed)
[facility] Group Notes:  (Counselor/Nursing/MHT/Case Management/Adjunct)  06/24/2011 4:03 PM  Patient will be attending groups on [address] to address depression and substance use.    HartisAram Swanson 06/24/2011, 4:03 PM

## 2011-06-24 NOTE — Progress Notes (Signed)
Patient ID: Maureen Swanson, female   DOB: 02-02-1966, 46 y.o.   MRN: [id number] Patient was pleasant and cooperative during the assessment. Went over the info that Clinical research associate was giving in report. Writer informed pt that she was told pt wanted adm at [facility]. Pt stated that she's since changed her mind and wants to go home instead. States she's had A/H for 6 months and has been taking her meds regularly for 3 yrs.   Support and encouragement was offered.

## 2011-06-24 NOTE — Discharge Planning (Signed)
Met with patient in Aftercare Planning Group.   Was very depressed, poor eye contact.  York Spaniel does not want to go to [facility].  Explained to her that will not cancel appointment until later date in case changes her mind.  Did utilization review.  Ambrose Mantle, LCSW 06/24/2011, 3:47 PM

## 2011-06-24 NOTE — Progress Notes (Signed)
Patient ID: Maureen Swanson, female   DOB: [birth year], 46 y.o.   MRN: [id number]  Patient was pleasant and cooperative during the assessment. Pt slept most of shift and had to be awakened by the nurse. Patient was pleasant and cooperative during the assessment.  Support and encouragement was offered.

## 2011-06-25 MED ORDER — LAMOTRIGINE 200 MG PO TABS: 200.0000 mg | ORAL_TABLET | Freq: Every day | ORAL | Status: DC

## 2011-06-25 MED ORDER — CIPROFLOXACIN HCL 500 MG PO TABS: 500.0000 mg | ORAL_TABLET | ORAL | Status: DC

## 2011-06-25 MED ORDER — METRONIDAZOLE 500 MG PO TABS: 500.0000 mg | ORAL_TABLET | ORAL | Status: AC

## 2011-06-25 MED ORDER — BUPROPION HCL ER (SR) 150 MG PO TB12: 150.0000 mg | ORAL_TABLET | Freq: Two times a day (BID) | ORAL | Status: DC

## 2011-06-25 MED ORDER — CIPROFLOXACIN HCL 500 MG PO TABS: 500.0000 mg | ORAL_TABLET | ORAL | Status: AC

## 2011-06-25 MED ORDER — MIRTAZAPINE 15 MG PO TABS: 15.0000 mg | ORAL_TABLET | Freq: Every day | ORAL | Status: AC

## 2011-06-25 MED ORDER — BUPROPION HCL ER (SR) 150 MG PO TB12: 150.0000 mg | ORAL_TABLET | Freq: Every day | ORAL | Status: DC

## 2011-06-25 MED ORDER — CHLORDIAZEPOXIDE HCL 25 MG PO CAPS: 25.0000 mg | ORAL_CAPSULE | Freq: Every day | ORAL | Status: AC

## 2011-06-25 MED ORDER — TRIAMTERENE-HCTZ 37.5-25 MG PO CAPS: 1.0000 | ORAL_CAPSULE | ORAL | Status: DC

## 2011-06-25 MED ORDER — FLUOXETINE HCL 40 MG PO CAPS: 40.0000 mg | ORAL_CAPSULE | Freq: Every day | ORAL | Status: DC

## 2011-06-25 MED ORDER — ATENOLOL 50 MG PO TABS: 50.0000 mg | ORAL_TABLET | Freq: Every day | ORAL | Status: DC

## 2011-06-25 MED ORDER — ARIPIPRAZOLE 5 MG PO TABS: 5.0000 mg | ORAL_TABLET | Freq: Every day | ORAL | Status: DC

## 2011-06-25 NOTE — Progress Notes (Signed)
Patient ID: Maureen Swanson, female   DOB: 12/29/1965, 46 y.o.   MRN: [id number] Writer reviewed d/c instructions with pt including medications, follow up appointments and crisis intervention. Pt verbally acknowledged understanding of instructions. Pt denies SI/HI and AVH and states that she has no reservation about leaving [facility] at this time. Pt mood and affect are appropriate to the situation. Pt belongings returned from locker and pt is released into her own care.

## 2011-06-25 NOTE — Progress Notes (Signed)
[facility] Case Management Discharge Plan:  Will you be returning to the same living situation after discharge: Yes,  Lives with fiance Would you like a referral for services when you are discharged:Yes,  Asked for referral to residential treatment, as well as back to [facility] [place] for medication management and therapy.  Asks for therapy once weekly now. Do you have access to transportation at discharge:No.  Bus pass provided  Do you have the ability to pay for your medications:No.  Samples provided, plus information re how to apply for patient assistance program on Abilify, patient assistance program on Wellbutrin, plus gave patient a [employer] card for reduced pharmacy costs in [facility]  Interagency Information:   Releases of Information prepared for patient signature at D/C.  Patient to Follow up at:  Follow-up Information    Schedule an appointment as soon as possible for a visit with Lawanna Kobus, therapist. (Your last appointment with her was 06/19/11)    Contact information:   [facility] [place], [place] [address] [place], [place]  [address]  Telephone:  [phone]      Follow up with Lidia Collum, P.A. on 07/23/2011. (8AM appointment)    Contact information:   [facility] [place], [place] [address] [place], [place]  [address]  Telephone:  [phone]      Follow up with [facility] on 07/01/2011. (8AM sharp - appointment for assessment.  They can pick you up from [facility] if you call.)    Contact information:   [address] [place],  [place]  [address] Telephone:  [phone]         Patient denies SI/HI:   Yes,      Safety Planning and Suicide Prevention discussed:  Yes,   During Aftercare Planning Group, Case Manager provided psychoeducation on "Suicide Prevention Information."  This included descriptions of risk factors for suicide, warning signs that an individual is in crisis and  thinking of suicide, and what to do if this occurs.  Pt indicated understanding of information provided, and will read brochure given upon discharge.     Barrier to discharge identified:No.  Summary and Recommendations:  It is recommended that patient go to residential treatment as arranged, although she does not know at this time if she will go.  This was emphasized during Treatment Team to her.   Sarina Ser 06/25/2011, 11:43 AM

## 2011-06-25 NOTE — Discharge Summary (Signed)
   Information: 46 year old [place] American female this is a voluntary admission.  Date of admission: 06/21/2011. 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  Alcohol Dependence.  Cocaine Abuse - Episodic.  Discharge medications:  Librium 25 mg, take 1 capsule on January 10 at bedtime in her detox was finished. Ciprofloxacin 500 mg twice a day to finish, for UTI. Fluoxetine 40 mg daily. For depression. Flagyl 500 mg twice a day until finished, for Trichomonas. Mirtazapine 15 mg daily at bedtime, to improve appetite, help with sleep, and for depression. Abilify 5 mg daily for clear thoughts. Bupropion 150 mg, 12 hour tablet, SR, take 1 twice a day for depression. Lamictal 200 mg daily at bedtime, for mood stability. Atenolol 50 mg daily, for elevated blood pressure. Advair 250/50, 1 puff every 12 hours, for COPD. Methocarbamol 500 mg one every 6 hours as needed for back spasms. Multivitamins one day, nutritional supplements. Protonix 40 mg daily for GERD. Triamterene hydrochlorothiazide 37.5, 25 mg. Take one every morning for blood pressure.  Please stop the following medications: Citalopram, clindamycin, hydrocodone, sertraline, and trazodone.  Discharge plans:  Maureen Swanson will followup at 8 AM on Tuesday, 07/01/2011, at [facility] recovery services for 28 day rehabilitation stay. She will followup with her provider [facility] on 07/23/2011 for medication management.  Course of hospitalization:  Maureen Swanson is an 46 y.o. female. Presented with SI and plan to cut self with a knife or razor. Pt states she has increased volume of drinking for the past month as well. Usually drinks 4 40oz beers daily, at times will also have wine or liquor. Reports having a 5 month sobriety from crack cocaine prior to one time 2 hour use last evening (1/3). Admittedt to hearing voices and things, which she  will talk or respond to.  She presented cooperative but guarded in affect and admitting that she had alcoholism. She was started on a Librium detox protocol with a goal of safe detox. She she attended programs on her mood disorders and substance abuse programs. She tolerated her detox well. Abilify was restarted to address her symptoms of auditory hallucination and paranoia. Bupropion was restarted and sertraline discontinued. Celexa was also discontinued.  She was found to be positive for Trichomonas and had significant pyuria, and was started on metronidazole and Cipro respectively.  By January 13 she was in full contact with reality with an open affect and denied any dangerous ideas. She admitted that she knew she needed to attend a substance abuse treatment program and planned on following through with a referral to [facility] recovery services. She is discharged today with 30 day prescriptions and 14 days of samples of her psychiatric medications.

## 2011-06-25 NOTE — Progress Notes (Signed)
Suicide Risk Assessment  Discharge Assessment     Demographic factors:  Assessment Details Time of Assessment: Admission Current Mental Status:  Current Mental Status: Self-harm thoughts;Self-harm behaviors Risk Reduction Factors:  Risk Reduction Factors: Sense of responsibility to family;Living with another person, especially a relative;Positive social support  CLINICAL FACTORS:   Severe Anxiety and/or Agitation Depression:   Anhedonia Hopelessness Insomnia Alcohol/Substance Abuse/Dependencies More than one psychiatric diagnosis Previous Psychiatric Diagnoses and Treatments Medical Diagnoses and Treatments/Surgeries  COGNITIVE FEATURES THAT CONTRIBUTE TO RISK:  None Noted  Diagnosis:  Axis I: 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  Alcohol Dependence.  Cocaine Abuse - Episodic.   The patient was seen today and reports the following:   Sleep: The patient reports to sleeping well last night.  Appetite: Appetite good.   Mild>(1-10) >Severe  Hopelessness (1-10): 0  Depression (1-10): 1  Anxiety (1-10): 0   Suicidal Ideation: The patient adamantly denies any suicidal ideations today.  Plan: No  Intent: No  Means: No   Homicidal Ideation: The patient adamantly denies any homicidal ideations today.  Plan: No  Intent: No.  Means: No   Eye Contact: Good.  General Appearance/Behavior: Neat and Casual  Motor Behavior: Appropriate.  Speech: Appropriate in rate and volume with no pressuring noted..  Mental Status: Alert and Oriented x 3  Level of Consciousness: Alert  Mood: Mildly depressed today.  Affect: Mildly constricted.  Anxiety: Resolved.  Thought Process: WNL.  Thought Content: The patient denies any auditory hallucinations today.  She denies any visual hallucinations or delusional thinking.  Perception: Appropriate  Judgment: Fair to Good.  Insight: Fair to Good.  Cognition: Orientation time, place and person.   Treatment  Plan Summary:  1. Daily contact with patient to assess and evaluate symptoms and progress in treatment  2. Medication management  3. The patient will deny suicidal ideations or homicidal ideations for 48 hours prior to discharge and have a depression and anxiety rating of 3 or less. The patient will also deny any auditory or visual hallucinations or delusional thinking or display any manic or hypomanic behaviors.   Plan:  1. Will continue current medications. 2. Will continue to monitor. 3. The patient will be discharged today and will enter [facility] for further substance abuse treatment on July 01, 2011.  SUICIDE RISK:   Minimal: No identifiable suicidal ideation.  Patients presenting with no risk factors but with morbid ruminations; may be classified as minimal risk based on the severity of the depressive symptoms  Maureen Swanson 06/25/2011, 12:20 PM

## 2011-06-25 NOTE — Progress Notes (Signed)
Recreation Therapy Notes  06/25/2011         Time: 0930      Group Topic/Focus: The focus of this group is on enhancing the patient's understanding of leisure, barriers to leisure, and the importance of engaging in positive leisure activities upon discharge for improved total health.  Participation Level: Active  Participation Quality: Appropriate  Affect: Appropriate  Cognitive: Oriented   Additional Comments: None.   Jermayne Sweeney 06/25/2011 11:57 AM 

## 2011-06-25 NOTE — Tx Team (Signed)
Interdisciplinary Treatment Plan Update (Adult)  Date:  06/25/2011  Time Reviewed:  10:21 AM   Progress in Treatment: Attending groups:  Yes Participating in groups:    Yes, fully engaged Taking medication as prescribed:    Yes, no refusals Tolerating medication:   Yes, no side effects noted by staff or reported by patient Family/Significant other contact made:  Not yet Patient understands diagnosis:   Yes Discussing patient identified problems/goals with staff:   Yes Medical problems stabilized or resolved:   Yes Denies suicidal/homicidal ideation:  Yes Issues/concerns per patient self-inventory:   None Other:  New problem(s) identified: Yes, Describe:  now has decided does not want to go to rehabilitation after hospital stay  Reason for Continuation of Hospitalization: None  Interventions implemented related to continuation of hospitalization:  Medication monitoring and adjustment, safety checks Q15 min., group therapy, psychoeducation, collateral contact - until discharge  Additional comments:  Not applicable  Estimated length of stay:  Discharge today  Discharge Plan:  Take bus home where she lives with fiance, follow up with [facility] [place] for medication management and therapy, go to [facility] for intake for 28-day rehab  New goal(s):  Not applicable  Review of initial/current patient goals per problem list:   1.  Goal(s):  Reduce psychotic symptoms to baseline.  Met:  Yes  Target date:  By Discharge   As evidenced by:  Denies all  2.  Goal(s):  Arrange for rehability facility after discharge.  Met:  Yes  Target date:  By Discharge   As evidenced by:  Arranged to go to [facility]  3.  Goal(s):  Reduce depression and anxiety from 10 to 3.  Met:  Yes  Target date:  By Discharge   As evidenced by:  1-depression and 0-anxiety  4.  Goal(s):  Get through withdrawal safely.  Met:  Yes  Target date:  By Discharge   As  evidenced by:  Withdrawal symptoms controlled by Librium protocol, will leave with 1 day dose to take  Attendees: Patient:  Maureen Swanson  06/25/2011 11:01 AM   Family:     Physician:  Dr. Harvie Heck Readling 06/25/2011 11:01 AM   Nursing:   Robbie Louis, RN 06/25/2011   11:01 AM   Case Manager:  Ambrose Mantle, LCSW 06/25/2011  11:01 AM   Counselor:     Other:   Lynann Bologna, NP 06/25/2011  11:01 AM   Other:      Other:      Other:       Scribe for Treatment Team:   Sarina Ser, 06/25/2011, 10:21 AM

## 2011-06-27 NOTE — Progress Notes (Signed)
Patient Discharge Instructions:  Admission Note Faxed,  06/26/2011 After Visit Summary Faxed,  06/26/2011 Faxed to the Next Level Care provider:  06/26/2011 D/C Summary faxed 06/26/2011 Facesheet faxed 06/26/2011   Faxed to [facility] @ [phone] And to [facility] [address] and [place], [place] @ [phone]  Wandra Scot, 06/27/2011, 11:43 AM

## 2011-07-02 ENCOUNTER — Other Ambulatory Visit: Payer: Self-pay | Admitting: Internal Medicine

## 2011-07-02 DIAGNOSIS — M545 Low back pain: Secondary | ICD-10-CM

## 2011-07-07 ENCOUNTER — Emergency Department (HOSPITAL_COMMUNITY)
Admission: EM | Admit: 2011-07-07 | Discharge: 2011-07-07 | Disposition: A | Discharge status: Home / Self Care | Payer: Self-pay | Attending: Emergency Medicine | Admitting: Emergency Medicine

## 2011-07-07 ENCOUNTER — Encounter (HOSPITAL_COMMUNITY): Payer: Self-pay

## 2011-07-07 ENCOUNTER — Ambulatory Visit (HOSPITAL_COMMUNITY)
Admission: RE | Admit: 2011-07-07 | Discharge: 2011-07-07 | Disposition: A | Discharge status: Home / Self Care | Payer: Self-pay | Source: Ambulatory Visit | Attending: Internal Medicine | Admitting: Internal Medicine

## 2011-07-07 DIAGNOSIS — I10 Essential (primary) hypertension: Secondary | ICD-10-CM | POA: Insufficient documentation

## 2011-07-07 DIAGNOSIS — M51379 Other intervertebral disc degeneration, lumbosacral region without mention of lumbar back pain or lower extremity pain: Secondary | ICD-10-CM | POA: Insufficient documentation

## 2011-07-07 DIAGNOSIS — K219 Gastro-esophageal reflux disease without esophagitis: Secondary | ICD-10-CM | POA: Insufficient documentation

## 2011-07-07 DIAGNOSIS — E119 Type 2 diabetes mellitus without complications: Secondary | ICD-10-CM | POA: Insufficient documentation

## 2011-07-07 DIAGNOSIS — K644 Residual hemorrhoidal skin tags: Secondary | ICD-10-CM | POA: Insufficient documentation

## 2011-07-07 DIAGNOSIS — J45909 Unspecified asthma, uncomplicated: Secondary | ICD-10-CM | POA: Insufficient documentation

## 2011-07-07 DIAGNOSIS — M545 Low back pain: Secondary | ICD-10-CM

## 2011-07-07 DIAGNOSIS — R109 Unspecified abdominal pain: Secondary | ICD-10-CM | POA: Insufficient documentation

## 2011-07-07 DIAGNOSIS — M5137 Other intervertebral disc degeneration, lumbosacral region: Secondary | ICD-10-CM | POA: Insufficient documentation

## 2011-07-07 DIAGNOSIS — E7800 Pure hypercholesterolemia, unspecified: Secondary | ICD-10-CM | POA: Insufficient documentation

## 2011-07-07 MED ORDER — SUCRALFATE 1 G PO TABS: 1.0000 g | ORAL_TABLET | Freq: Four times a day (QID) | ORAL | Status: DC

## 2011-07-07 MED ORDER — HYDROCORTISONE 2.5 % RE CREA: TOPICAL_CREAM | RECTAL | Status: AC

## 2011-07-07 NOTE — ED Notes (Signed)
sts her hemorrhoids are acting up and has pain from bleeding ulcers that started three monthsa go.

## 2011-07-07 NOTE — ED Notes (Signed)
Discharge instructions reviewed; verbalizes understanding.  No questions asked; no further c/o's voiced.  Ambulatory to lobby. 

## 2011-07-07 NOTE — ED Provider Notes (Signed)
History     CSN: [id number]  Arrival date & time 07/07/11  1343   First MD Initiated Contact with Patient 07/07/11 1550      Chief Complaint  Patient presents with  . Hemorrhoids  . GI Problem    (Consider location/radiation/quality/duration/timing/severity/associated sxs/prior treatment) Patient is a 46 y.o. female presenting with abdominal pain. The history is provided by the patient (The patient states that she has hemorrhoids that have been bothering her. She also states that she has a history of GERD in no medicine seems to help but that she's also having difficulty swallowing.). No language interpreter was used.  Abdominal Pain The primary symptoms of the illness include abdominal pain. The primary symptoms of the illness do not include fatigue, vomiting or diarrhea. The current episode started more than 2 days ago. The onset of the illness was gradual. The problem has not changed since onset. The illness is associated with eating. The patient states that she believes she is currently not pregnant. The patient has not had a change in bowel habit. Symptoms associated with the illness do not include chills, anorexia, hematuria, frequency or back pain. Significant associated medical issues do not include diverticulitis.    Past Medical History  Diagnosis Date  . Diabetes mellitus   . Hypertension   . Asthma   . High cholesterol   . Depression     Past Surgical History  Procedure Date  . Joint replacement     History reviewed. No pertinent family history.  History  Substance Use Topics  . Smoking status: Never Smoker   . Smokeless tobacco: Never Used  . Alcohol Use: 7.2 oz/week    12 Cans of beer per week     drinks 4- 40oz/day    OB History    Grav Para Term Preterm Abortions TAB SAB Ect Mult Living                  Review of Systems  Constitutional: Negative for chills and fatigue.  HENT: Negative for congestion, sinus pressure and ear discharge.   Eyes:  Negative for discharge.  Respiratory: Negative for cough.   Cardiovascular: Negative for chest pain.  Gastrointestinal: Positive for abdominal pain and rectal pain. Negative for vomiting, diarrhea and anorexia.  Genitourinary: Negative for frequency and hematuria.  Musculoskeletal: Negative for back pain.  Skin: Negative for rash.  Neurological: Negative for seizures and headaches.  Hematological: Negative.   Psychiatric/Behavioral: Negative for hallucinations.    Allergies  Penicillins; Chocolate; and Orange  Home Medications   Current Outpatient Rx  Name Route Sig Dispense Refill  . ARIPIPRAZOLE 5 MG PO TABS Oral Take 1 tablet (5 mg total) by mouth daily. For clear thoughts and relief from paranoia. 30 tablet 0  . ATENOLOL 50 MG PO TABS Oral Take 50 mg by mouth daily.      . BUPROPION HCL ER (SR) 150 MG PO TB12 Oral Take 1 tablet (150 mg total) by mouth 2 (two) times daily. For depression. 60 tablet 0  . CHLORDIAZEPOXIDE HCL 25 MG PO CAPS Oral Take 1 capsule (25 mg total) by mouth daily. 1 capsule     Take one tomorrow 06/26/11, at bedtime and your det ...  . FLUOXETINE HCL 40 MG PO CAPS Oral Take 1 capsule (40 mg total) by mouth daily. 30 capsule 0  . FLUTICASONE-SALMETEROL 250-50 MCG/DOSE IN AEPB Inhalation Inhale 1 puff into the lungs every 12 (twelve) hours.      Marland Kitchen LAMOTRIGINE 200  MG PO TABS Oral Take 1 tablet (200 mg total) by mouth at bedtime. 30 tablet 0  . METHOCARBAMOL 500 MG PO TABS Oral Take 500 mg by mouth every 6 (six) hours as needed. For pain     . MIRTAZAPINE 15 MG PO TABS Oral Take 1 tablet (15 mg total) by mouth at bedtime. 30 tablet 0  . ADULT MULTIVITAMIN W/MINERALS CH Oral Take 1 tablet by mouth daily.      Marland Kitchen PANTOPRAZOLE SODIUM 40 MG PO TBEC Oral Take 40 mg by mouth daily.      . TRIAMTERENE-HCTZ 37.5-25 MG PO CAPS Oral Take 1 each (1 capsule total) by mouth every morning. For blood pressure      BP 134/98  Pulse 67  Temp(Src) 99.2 F (37.3 C) (Oral)  Resp  16  SpO2 97%  Physical Exam  Constitutional: She is oriented to person, place, and time. She appears well-developed.  HENT:  Head: Normocephalic and atraumatic.  Eyes: Conjunctivae and EOM are normal. No scleral icterus.  Neck: Neck supple. No thyromegaly present.  Cardiovascular: Normal rate and regular rhythm.  Exam reveals no gallop and no friction rub.   No murmur heard. Pulmonary/Chest: No stridor. She has no wheezes. She has no rales. She exhibits no tenderness.  Abdominal: She exhibits no distension. There is tenderness. There is no rebound.       Patient has moderate epigastric tenderness no rebound.  Genitourinary:       Patient has a couple small external hemorrhoids that are not inflamed.  Musculoskeletal: Normal range of motion. She exhibits no edema.  Lymphadenopathy:    She has no cervical adenopathy.  Neurological: She is oriented to person, place, and time. Coordination normal.  Skin: No rash noted. No erythema.  Psychiatric: She has a normal mood and affect. Her behavior is normal.    ED Course  Procedures (including critical care time)  Labs Reviewed - No data to display Mr Lumbar Spine Wo Contrast  07/07/2011  *RADIOLOGY REPORT*  Clinical Data: Back pain radiating into both legs for 4 years.  MRI LUMBAR SPINE WITHOUT CONTRAST  Technique:  Multiplanar and multiecho pulse sequences of the lumbar spine were obtained without intravenous contrast.  Comparison: 06/01/2009  Findings: As on the prior exam, the lowest full intervertebral disk space is labeled L5-S1.  If procedural intervention is to be performed, careful correlation with this numbering strategy is recommended.  The conus medullaris appears unremarkable.  Conus level:  L1.  No significant vertebral subluxation.  Inversion recovery weighted images demonstrate no significant abnormal vertebral or periligamentous edema.  Intervertebral disc desiccation is noted at L5-S1.  Additional findings at individual levels  are as follows:  L2-3:  Unremarkable.  L3-4:  Mild disc bulge noted with minimal facet arthropathy. The AP diameter of the thecal sac is 10 mm, borderline for central stenosis.  L4-5:  Diffuse disc bulge noted with mild facet arthropathy. The AP diameter of the thecal sac is 8 mm, compatible with moderate central stenosis.  There is borderline left foraminal stenosis along with mild bilateral subarticular lateral recess stenosis. The disc bulge appears to have a more posterior convex contour than on the prior exam.  L5-S1:  Broad central and right paracentral disc protrusion is again noted, with mild bilateral subarticular lateral recess stenosis (right greater than left) and mild central stenosis.  IMPRESSION:  1.  Minimally progressive degenerative disc disease, with mild to moderate impingement at L4-5 and L5-S1.  Original Report Authenticated By:  Dellia Cloud, M.D.     No diagnosis found.    MDM  gerd and hemorrhoids.  With esophageal stricture        Benny Lennert, MD 07/07/11 [phone]

## 2011-07-08 ENCOUNTER — Encounter: Payer: Self-pay | Admitting: Internal Medicine

## 2011-07-28 ENCOUNTER — Ambulatory Visit: Payer: Self-pay | Admitting: Internal Medicine

## 2011-08-25 ENCOUNTER — Ambulatory Visit: Payer: Self-pay | Admitting: Internal Medicine

## 2011-08-27 ENCOUNTER — Other Ambulatory Visit (HOSPITAL_COMMUNITY): Payer: Self-pay | Admitting: Physician Assistant

## 2011-08-27 DIAGNOSIS — Z1231 Encounter for screening mammogram for malignant neoplasm of breast: Secondary | ICD-10-CM

## 2011-09-03 ENCOUNTER — Other Ambulatory Visit (HOSPITAL_COMMUNITY): Payer: Self-pay | Admitting: Internal Medicine

## 2011-09-03 DIAGNOSIS — Z780 Asymptomatic menopausal state: Secondary | ICD-10-CM

## 2011-09-24 ENCOUNTER — Ambulatory Visit (HOSPITAL_COMMUNITY): Payer: Self-pay

## 2011-09-25 ENCOUNTER — Emergency Department (HOSPITAL_COMMUNITY): Payer: Self-pay

## 2011-09-25 ENCOUNTER — Encounter (HOSPITAL_COMMUNITY): Payer: Self-pay | Admitting: *Deleted

## 2011-09-25 ENCOUNTER — Emergency Department (HOSPITAL_COMMUNITY)
Admission: EM | Admit: 2011-09-25 | Discharge: 2011-09-25 | Disposition: A | Discharge status: Home / Self Care | Payer: Self-pay | Attending: Emergency Medicine | Admitting: Emergency Medicine

## 2011-09-25 DIAGNOSIS — F329 Major depressive disorder, single episode, unspecified: Secondary | ICD-10-CM | POA: Insufficient documentation

## 2011-09-25 DIAGNOSIS — R112 Nausea with vomiting, unspecified: Secondary | ICD-10-CM | POA: Insufficient documentation

## 2011-09-25 DIAGNOSIS — N390 Urinary tract infection, site not specified: Secondary | ICD-10-CM | POA: Insufficient documentation

## 2011-09-25 DIAGNOSIS — E7800 Pure hypercholesterolemia, unspecified: Secondary | ICD-10-CM | POA: Insufficient documentation

## 2011-09-25 DIAGNOSIS — R109 Unspecified abdominal pain: Secondary | ICD-10-CM | POA: Insufficient documentation

## 2011-09-25 DIAGNOSIS — Z79899 Other long term (current) drug therapy: Secondary | ICD-10-CM | POA: Insufficient documentation

## 2011-09-25 DIAGNOSIS — R197 Diarrhea, unspecified: Secondary | ICD-10-CM | POA: Insufficient documentation

## 2011-09-25 DIAGNOSIS — E119 Type 2 diabetes mellitus without complications: Secondary | ICD-10-CM | POA: Insufficient documentation

## 2011-09-25 DIAGNOSIS — I10 Essential (primary) hypertension: Secondary | ICD-10-CM | POA: Insufficient documentation

## 2011-09-25 DIAGNOSIS — F3289 Other specified depressive episodes: Secondary | ICD-10-CM | POA: Insufficient documentation

## 2011-09-25 DIAGNOSIS — J45909 Unspecified asthma, uncomplicated: Secondary | ICD-10-CM | POA: Insufficient documentation

## 2011-09-25 LAB — URINALYSIS, ROUTINE W REFLEX MICROSCOPIC
Hgb urine dipstick: NEGATIVE
Protein, ur: 30 mg/dL — AB
Urobilinogen, UA: 0.2 mg/dL (ref 0.0–1.0)

## 2011-09-25 LAB — CBC
Hemoglobin: 13.1 g/dL (ref 12.0–15.0)
MCV: 92.8 fL (ref 78.0–100.0)
Platelets: 245 10*3/uL (ref 150–400)
RBC: 4.43 MIL/uL (ref 3.87–5.11)
WBC: 4.7 10*3/uL (ref 4.0–10.5)

## 2011-09-25 LAB — COMPREHENSIVE METABOLIC PANEL
ALT: 31 U/L (ref 0–35)
Alkaline Phosphatase: 66 U/L (ref 39–117)
CO2: 22 mEq/L (ref 19–32)
Calcium: 10 mg/dL (ref 8.4–10.5)
GFR calc Af Amer: 70 mL/min — ABNORMAL LOW (ref >90)
GFR calc non Af Amer: 61 mL/min — ABNORMAL LOW (ref >90)
Glucose, Bld: 100 mg/dL — ABNORMAL HIGH (ref 70–99)
Potassium: 2.9 mEq/L — ABNORMAL LOW (ref 3.5–5.1)
Sodium: 138 mEq/L (ref 135–145)

## 2011-09-25 LAB — DIFFERENTIAL
Eosinophils Relative: 0 % (ref 0–5)
Lymphocytes Relative: 12 % (ref 12–46)
Lymphs Abs: 0.6 10*3/uL — ABNORMAL LOW (ref 0.7–4.0)

## 2011-09-25 LAB — URINE MICROSCOPIC-ADD ON

## 2011-09-25 MED ORDER — POTASSIUM CHLORIDE 10 MEQ/100ML IV SOLN
10.0000 meq | Freq: Once | INTRAVENOUS | Status: AC
  Administered 2011-09-25: 10 meq via INTRAVENOUS
  Filled 2011-09-25: qty 100

## 2011-09-25 MED ORDER — SODIUM CHLORIDE 0.9 % IV SOLN: INTRAVENOUS | Status: DC

## 2011-09-25 MED ORDER — SULFAMETHOXAZOLE-TRIMETHOPRIM 800-160 MG PO TABS: 1.0000 | ORAL_TABLET | Freq: Two times a day (BID) | ORAL | Status: AC

## 2011-09-25 MED ORDER — ONDANSETRON HCL 4 MG/2ML IJ SOLN
4.0000 mg | INTRAMUSCULAR | Status: DC | PRN
  Administered 2011-09-25: 4 mg via INTRAVENOUS
  Filled 2011-09-25: qty 2

## 2011-09-25 MED ORDER — ONDANSETRON HCL 4 MG PO TABS: 4.0000 mg | ORAL_TABLET | Freq: Three times a day (TID) | ORAL | Status: AC | PRN

## 2011-09-25 MED ORDER — SODIUM CHLORIDE 0.9 % IV BOLUS (SEPSIS)
500.0000 mL | Freq: Once | INTRAVENOUS | Status: AC
  Administered 2011-09-25: 13:00:00 via INTRAVENOUS

## 2011-09-25 MED ORDER — FAMOTIDINE IN NACL 20-0.9 MG/50ML-% IV SOLN
20.0000 mg | Freq: Once | INTRAVENOUS | Status: AC
  Administered 2011-09-25: 20 mg via INTRAVENOUS
  Filled 2011-09-25: qty 50

## 2011-09-25 MED ORDER — POTASSIUM CHLORIDE 20 MEQ/15ML (10%) PO LIQD: 40.0000 meq | Freq: Once | ORAL | Status: DC

## 2011-09-25 NOTE — ED Notes (Signed)
ZOX:[id number]<AV> Expected date:09/25/11<BR> Expected time:<BR> Means of arrival:Ambulance<BR> Comments:<BR>

## 2011-09-25 NOTE — ED Provider Notes (Signed)
History     CSN: [id number]  Arrival date & time 09/25/11  1152   First MD Initiated Contact with Patient 09/25/11 1215      Chief Complaint  Patient presents with  . Nausea    per EMS pt in from home, reports n/v/d since sunday.   . Emesis  . Diarrhea    HPI Pt was seen at 1250.  Per pt, c/o gradual onset and persistence of multiple intermittent episodes of N/V/D for the past 2-3 days.  Has been assoc with generalized "cramping" abd pain.  Denies fevers, no rash, no back pain, no black or blood in stools or emesis, no CP/SOB.      Past Medical History  Diagnosis Date  . Diabetes mellitus   . Hypertension   . Asthma   . High cholesterol   . Depression     Past Surgical History  Procedure Date  . Joint replacement     History  Substance Use Topics  . Smoking status: Never Smoker   . Smokeless tobacco: Never Used  . Alcohol Use: 7.2 oz/week    12 Cans of beer per week     drinks 4- 40oz/day    Review of Systems ROS: Statement: All systems negative except as marked or noted in the HPI; Constitutional: Negative for fever and chills. ; ; Eyes: Negative for eye pain, redness and discharge. ; ; ENMT: Negative for ear pain, hoarseness, nasal congestion, sinus pressure and sore throat. ; ; Cardiovascular: Negative for chest pain, palpitations, diaphoresis, dyspnea and peripheral edema. ; ; Respiratory: Negative for cough, wheezing and stridor. ; ; Gastrointestinal: +N/V/D, abd pain. Negative for blood in stool, hematemesis, jaundice and rectal bleeding. . ; ; Genitourinary: Negative for dysuria, flank pain and hematuria. ; ; Musculoskeletal: Negative for back pain and neck pain. Negative for swelling and trauma.; ; Skin: Negative for pruritus, rash, abrasions, blisters, bruising and skin lesion.; ; Neuro: Negative for headache, lightheadedness and neck stiffness. Negative for weakness, altered level of consciousness , altered mental status, extremity weakness, paresthesias,  involuntary movement, seizure and syncope.      Allergies  Penicillins; Chocolate; and Orange  Home Medications   Current Outpatient Rx  Name Route Sig Dispense Refill  . ALBUTEROL SULFATE HFA 108 (90 BASE) MCG/ACT IN AERS Inhalation Inhale 2 puffs into the lungs every 6 (six) hours as needed.    . BC HEADACHE POWDER PO Oral Take by mouth daily as needed. For pain    . ATENOLOL 50 MG PO TABS Oral Take 50 mg by mouth daily.      . BUPROPION HCL ER (SR) 150 MG PO TB12 Oral Take 1 tablet (150 mg total) by mouth 2 (two) times daily. For depression. 60 tablet 0  . CITALOPRAM HYDROBROMIDE 20 MG PO TABS Oral Take 20 mg by mouth daily.    Marland Kitchen FLUTICASONE-SALMETEROL 250-50 MCG/DOSE IN AEPB Inhalation Inhale 1 puff into the lungs every 12 (twelve) hours.      Marland Kitchen LAMOTRIGINE 200 MG PO TABS Oral Take 1 tablet (200 mg total) by mouth at bedtime. 30 tablet 0  . ADULT MULTIVITAMIN W/MINERALS CH Oral Take 1 tablet by mouth daily.      . TRAZODONE HCL 150 MG PO TABS Oral Take 300 mg by mouth at bedtime.    . TRIAMTERENE-HCTZ 37.5-25 MG PO CAPS Oral Take 1 each (1 capsule total) by mouth every morning. For blood pressure      BP 143/90  Pulse 92  Temp(Src) 99 F (37.2 C) (Oral)  Resp 20  Ht 5\' 5"  (1.651 m)  Wt 180 lb (81.647 kg)  BMI 29.95 kg/m2  SpO2 100%  Physical Exam 1255: Physical examination:  Nursing notes reviewed; Vital signs and O2 SAT reviewed;  Constitutional: Well developed, Well nourished, Well hydrated, In no acute distress; Head:  Normocephalic, atraumatic; Eyes: EOMI, PERRL, No scleral icterus; ENMT: Mouth and pharynx normal, Mucous membranes moist; Neck: Supple, Full range of motion, No lymphadenopathy; Cardiovascular: Regular rate and rhythm, No murmur, rub, or gallop; Respiratory: Breath sounds clear & equal bilaterally, No rales, rhonchi, wheezes, or rub, Normal respiratory effort/excursion; Chest: Nontender, Movement normal; Abdomen: Soft, +mild diffuse tenderness to palp, no  rebound or guarding, Nondistended, Normal bowel sounds; Extremities: Pulses normal, No tenderness, No edema, No calf edema or asymmetry.; Neuro: AA&Ox3, Major CN grossly intact.  No gross focal motor or sensory deficits in extremities.; Skin: Color normal, Warm, Dry, no rash.    ED Course  Procedures    MDM  MDM Reviewed: nursing note and vitals Interpretation: labs and x-ray   Results for orders placed during the hospital encounter of 09/25/11  CBC      Component Value Range   WBC 4.7  4.0 - 10.5 (K/uL)   RBC 4.43  3.87 - 5.11 (MIL/uL)   Hemoglobin 13.1  12.0 - 15.0 (g/dL)   HCT 45.4  09.8 - 11.9 (%)   MCV 92.8  78.0 - 100.0 (fL)   MCH 29.6  26.0 - 34.0 (pg)   MCHC 31.9  30.0 - 36.0 (g/dL)   RDW 14.7  82.9 - 56.2 (%)   Platelets 245  150 - 400 (K/uL)  DIFFERENTIAL      Component Value Range   Neutrophils Relative 82 (*) 43 - 77 (%)   Neutro Abs 3.8  1.7 - 7.7 (K/uL)   Lymphocytes Relative 12  12 - 46 (%)   Lymphs Abs 0.6 (*) 0.7 - 4.0 (K/uL)   Monocytes Relative 6  3 - 12 (%)   Monocytes Absolute 0.3  0.1 - 1.0 (K/uL)   Eosinophils Relative 0  0 - 5 (%)   Eosinophils Absolute 0.0  0.0 - 0.7 (K/uL)   Basophils Relative 0  0 - 1 (%)   Basophils Absolute 0.0  0.0 - 0.1 (K/uL)  COMPREHENSIVE METABOLIC PANEL      Component Value Range   Sodium 138  135 - 145 (mEq/L)   Potassium 2.9 (*) 3.5 - 5.1 (mEq/L)   Chloride 102  96 - 112 (mEq/L)   CO2 22  19 - 32 (mEq/L)   Glucose, Bld 100 (*) 70 - 99 (mg/dL)   BUN 8  6 - 23 (mg/dL)   Creatinine, Ser 1.30  0.50 - 1.10 (mg/dL)   Calcium 86.5  8.4 - 10.5 (mg/dL)   Total Protein 8.3  6.0 - 8.3 (g/dL)   Albumin 4.5  3.5 - 5.2 (g/dL)   AST 59 (*) 0 - 37 (U/L)   ALT 31  0 - 35 (U/L)   Alkaline Phosphatase 66  39 - 117 (U/L)   Total Bilirubin 0.5  0.3 - 1.2 (mg/dL)   GFR calc non Af Amer 61 (*) >90 (mL/min)   GFR calc Af Amer 70 (*) >90 (mL/min)  LIPASE, BLOOD      Component Value Range   Lipase 21  11 - 59 (U/L)  URINALYSIS,  ROUTINE W REFLEX MICROSCOPIC      Component Value Range   Color,  Urine AMBER (*) YELLOW    APPearance CLOUDY (*) CLEAR    Specific Gravity, Urine 1.029  1.005 - 1.030    pH 6.0  5.0 - 8.0    Glucose, UA NEGATIVE  NEGATIVE (mg/dL)   Hgb urine dipstick NEGATIVE  NEGATIVE    Bilirubin Urine SMALL (*) NEGATIVE    Ketones, ur TRACE (*) NEGATIVE (mg/dL)   Protein, ur 30 (*) NEGATIVE (mg/dL)   Urobilinogen, UA 0.2  0.0 - 1.0 (mg/dL)   Nitrite POSITIVE (*) NEGATIVE    Leukocytes, UA NEGATIVE  NEGATIVE   PREGNANCY, URINE      Component Value Range   Preg Test, Ur NEGATIVE  NEGATIVE   URINE MICROSCOPIC-ADD ON      Component Value Range   Squamous Epithelial / LPF FEW (*) RARE    WBC, UA 0-2  <3 (WBC/hpf)   RBC / HPF 0-2  <3 (RBC/hpf)   Bacteria, UA MANY (*) RARE    Urine-Other MUCOUS PRESENT     Dg Abd Acute W/chest 09/25/2011  *RADIOLOGY REPORT*  Clinical Data: Abdominal pain, nausea, vomiting.  ACUTE ABDOMEN SERIES (ABDOMEN 2 VIEW & CHEST 1 VIEW)  Comparison: None.  Findings: Nonobstructive bowel gas pattern.  No organomegaly, free air or suspicious calcification.  Heart and mediastinal contours are within normal limits.  No focal opacities or effusions.  No acute bony abnormality.  IMPRESSION: No obstruction or free air.  No acute findings.  Original Report Authenticated By: Cyndie Chime, M.D.      4:37 PM:  Feels improved after meds.  Potassium repleted PO and IV.  Non-specific elevation of AST on labs today.  No specific RUQ tenderness on initial or re-exam of abd.  Has tol PO well while in ED without N/V.  No stooling in ED.  VSS, resps easy.  Wants to go home now.  Will tx +UTI, UC pending.  Dx testing d/w pt.  Questions answered.  Verb understanding, agreeable to d/c home with outpt f/u.         Laray Anger, DO 09/26/11 2011

## 2011-09-25 NOTE — ED Notes (Signed)
Called to room by pt, pt reports swelling and pain to IV site R FA, swelling noted. IVF stopped, Potassium infusion compete. IV team paged for consultation

## 2011-09-25 NOTE — Discharge Instructions (Signed)
RESOURCE GUIDE  Dental Problems  Patients with Medicaid: [facility]                     [facility] [address]                                           [address] Phone:  [phone]                                                  Phone:  [phone]  If unable to pay or uninsured, contact:  [facility] or [facility] to become qualified for the adult dental clinic.  Chronic Pain Problems Contact [facility]  [phone] Patients need to be referred by their primary care doctor.  Insufficient Money for Medicine Contact [employer]:  call "211" or [facility] 271-5999.  No Primary Care Doctor Call [facility]  [phone] Other agencies that provide inexpensive medical care    [facility]  [phone]    [facility]  [phone]    [facility]  [phone]    Women's Clinic  [phone]    [facility]  [phone]    [facility]  [phone]  Psychological Services [facility]  [phone] [facility]  [phone] [facility]   [phone] (emergency services [phone])  Substance Abuse Resources [facility]  336-882-2125 [facility] 336-784-9470 [facility] 336-285-9073 [facility] 336-845-3988 Residential & Outpatient Substance Abuse Program  800-659-3381  Abuse/Neglect [place] Child Abuse Hotline (336) 641-3795 [place] Child Abuse Hotline 800-378-5315 (After Hours)  Emergency Shelter [facility] (336) 271-5985  Maternity Homes [facility] (336) 275-9566 [facility] (704) 372-4663  MRSA Hotline #:   832-7006    [place] Resources  [facility] [place]     [employer]                          [facility] [address] [place]                       [address]      [address]  [place]                                                [place]                            [place] Phone:  [phone]                                   Phone:  [phone]                 Phone:  [phone]  [facility] Phone:  [phone]    [facility] Child Abuse Hotline (952)415-9406 (716)802-5334 (After Hours)   Take the prescription as directed.  Increase your fluid intake (ie:  Gatoraide) for the next few days, as discussed.  Eat a bland diet and advance to your regular diet slowly as you can tolerate it.   Avoid full strength juices, as well as milk and milk products until your diarrhea has resolved.   Call your regular medical doctor today to schedule a follow up appointment this week.  Return to the Emergency Department immediately if not improving (or even worsening) despite taking the medicines as prescribed, any black or bloody stool or vomit, if you develop a fever, or for any other concerns.

## 2011-09-27 LAB — URINE CULTURE

## 2011-10-24 ENCOUNTER — Ambulatory Visit (HOSPITAL_COMMUNITY)
Admission: RE | Admit: 2011-10-24 | Discharge: 2011-10-24 | Disposition: A | Discharge status: Home / Self Care | Payer: Self-pay | Source: Ambulatory Visit | Attending: Internal Medicine | Admitting: Internal Medicine

## 2011-10-24 ENCOUNTER — Ambulatory Visit (HOSPITAL_COMMUNITY)
Admission: RE | Admit: 2011-10-24 | Discharge: 2011-10-24 | Disposition: A | Discharge status: Home / Self Care | Payer: Self-pay | Source: Ambulatory Visit | Attending: Physician Assistant | Admitting: Physician Assistant

## 2011-10-24 DIAGNOSIS — Z1231 Encounter for screening mammogram for malignant neoplasm of breast: Secondary | ICD-10-CM | POA: Insufficient documentation

## 2011-10-24 DIAGNOSIS — Z780 Asymptomatic menopausal state: Secondary | ICD-10-CM

## 2011-10-28 ENCOUNTER — Other Ambulatory Visit: Payer: Self-pay | Admitting: Physician Assistant

## 2011-10-28 DIAGNOSIS — R928 Other abnormal and inconclusive findings on diagnostic imaging of breast: Secondary | ICD-10-CM

## 2011-11-11 ENCOUNTER — Ambulatory Visit
Admission: RE | Admit: 2011-11-11 | Discharge: 2011-11-11 | Disposition: A | Discharge status: Home / Self Care | Payer: 59 | Source: Ambulatory Visit | Attending: Physician Assistant | Admitting: Physician Assistant

## 2011-11-11 ENCOUNTER — Other Ambulatory Visit: Payer: Self-pay | Admitting: Physician Assistant

## 2011-11-11 DIAGNOSIS — R928 Other abnormal and inconclusive findings on diagnostic imaging of breast: Secondary | ICD-10-CM

## 2011-11-18 ENCOUNTER — Inpatient Hospital Stay: Admission: RE | Admit: 2011-11-18 | Payer: Self-pay | Source: Ambulatory Visit

## 2011-11-26 ENCOUNTER — Telehealth: Payer: Self-pay | Admitting: *Deleted

## 2011-11-26 NOTE — Telephone Encounter (Signed)
Telephoned both numbers on file and no answer.

## 2011-12-03 ENCOUNTER — Telehealth: Payer: Self-pay | Admitting: *Deleted

## 2011-12-03 NOTE — Telephone Encounter (Signed)
Telephoned patient at home # and lady that answered refused to take message.

## 2011-12-05 ENCOUNTER — Encounter (HOSPITAL_COMMUNITY): Payer: Self-pay | Admitting: *Deleted

## 2011-12-05 ENCOUNTER — Emergency Department (HOSPITAL_COMMUNITY)
Admission: EM | Admit: 2011-12-05 | Discharge: 2011-12-09 | Disposition: A | Discharge status: Home / Self Care | Payer: Self-pay | Attending: Emergency Medicine | Admitting: Emergency Medicine

## 2011-12-05 DIAGNOSIS — E119 Type 2 diabetes mellitus without complications: Secondary | ICD-10-CM | POA: Insufficient documentation

## 2011-12-05 DIAGNOSIS — Z79899 Other long term (current) drug therapy: Secondary | ICD-10-CM | POA: Insufficient documentation

## 2011-12-05 DIAGNOSIS — F3289 Other specified depressive episodes: Secondary | ICD-10-CM | POA: Insufficient documentation

## 2011-12-05 DIAGNOSIS — I10 Essential (primary) hypertension: Secondary | ICD-10-CM | POA: Insufficient documentation

## 2011-12-05 DIAGNOSIS — R259 Unspecified abnormal involuntary movements: Secondary | ICD-10-CM | POA: Insufficient documentation

## 2011-12-05 DIAGNOSIS — R42 Dizziness and giddiness: Secondary | ICD-10-CM | POA: Insufficient documentation

## 2011-12-05 DIAGNOSIS — F329 Major depressive disorder, single episode, unspecified: Secondary | ICD-10-CM | POA: Insufficient documentation

## 2011-12-05 DIAGNOSIS — F29 Unspecified psychosis not due to a substance or known physiological condition: Secondary | ICD-10-CM | POA: Insufficient documentation

## 2011-12-05 HISTORY — DX: Bipolar disorder, unspecified: F31.9

## 2011-12-05 HISTORY — DX: Gout, unspecified: M10.9

## 2011-12-05 HISTORY — DX: Anxiety disorder, unspecified: F41.9

## 2011-12-05 LAB — RAPID URINE DRUG SCREEN, HOSP PERFORMED
Amphetamines: NOT DETECTED
Barbiturates: NOT DETECTED
Cocaine: POSITIVE — AB
Tetrahydrocannabinol: NOT DETECTED

## 2011-12-05 LAB — CBC
HCT: 36 % (ref 36.0–46.0)
MCHC: 33.6 g/dL (ref 30.0–36.0)
MCV: 91.4 fL (ref 78.0–100.0)
RDW: 13.1 % (ref 11.5–15.5)

## 2011-12-05 LAB — COMPREHENSIVE METABOLIC PANEL
AST: 25 U/L (ref 0–37)
Albumin: 3.8 g/dL (ref 3.5–5.2)
Calcium: 9.5 mg/dL (ref 8.4–10.5)
Chloride: 103 mEq/L (ref 96–112)
Creatinine, Ser: 0.81 mg/dL (ref 0.50–1.10)
Total Bilirubin: 0.5 mg/dL (ref 0.3–1.2)
Total Protein: 7.2 g/dL (ref 6.0–8.3)

## 2011-12-05 LAB — URINALYSIS, ROUTINE W REFLEX MICROSCOPIC
Bilirubin Urine: NEGATIVE
Hgb urine dipstick: NEGATIVE
Specific Gravity, Urine: 1.014 (ref 1.005–1.030)
Urobilinogen, UA: 0.2 mg/dL (ref 0.0–1.0)

## 2011-12-05 MED ORDER — LORAZEPAM 2 MG/ML IJ SOLN
1.0000 mg | INTRAMUSCULAR | Status: DC | PRN
  Administered 2011-12-05 (×2): 2 mg via INTRAVENOUS
  Filled 2011-12-05 (×2): qty 1

## 2011-12-05 MED ORDER — HYDRALAZINE HCL 20 MG/ML IJ SOLN
10.0000 mg | INTRAMUSCULAR | Status: DC | PRN
  Filled 2011-12-05 (×2): qty 0.5

## 2011-12-05 MED ORDER — CLONIDINE HCL 0.1 MG PO TABS
0.1000 mg | ORAL_TABLET | Freq: Once | ORAL | Status: AC
  Administered 2011-12-05: 0.1 mg via ORAL
  Filled 2011-12-05: qty 1

## 2011-12-05 MED ORDER — TRIAMTERENE-HCTZ 37.5-25 MG PO TABS
1.0000 | ORAL_TABLET | Freq: Once | ORAL | Status: AC
  Administered 2011-12-05: 1 via ORAL
  Filled 2011-12-05: qty 1

## 2011-12-05 NOTE — ED Notes (Signed)
Per EMS pt took lamictal and drink 2 40's last night around 8 pm, went to bed around 9:30 pm, [place] this morning [place] in a park, called EMS. Pt complaining of headache, blurred vision and nausea. BP 199/127, 100% 2L, hx of bipolar and anxiety.

## 2011-12-05 NOTE — ED Notes (Signed)
Pt unsteady on feet, used bedside commode, voided yellow urine

## 2011-12-05 NOTE — ED Provider Notes (Signed)
I saw and evaluated the patient, reviewed the resident's note and I agree with the findings and plan.   .Face to face Exam:  General:  Awake HEENT:  Atraumatic Resp:  Normal effort Abd:  Nondistended Neuro:No focal weakness Lymph: No adenopathy   Nelia Shi, MD 12/05/11 1456

## 2011-12-05 NOTE — B[facility] Counselor (Signed)
TC from [facility] Assessment. Stated there is no ETOH level completed on this pt, who is requesting detox from ETOH. Spoke with RN and requested this lab be completed for possible placement.

## 2011-12-05 NOTE — ED Provider Notes (Signed)
History     CSN: [id number]  Arrival date & time 12/05/11  0825   First MD Initiated Contact with Patient 12/05/11 0827      Chief Complaint  Patient presents with  . Nausea    blacked out    (Consider location/radiation/quality/duration/timing/severity/associated sxs/prior treatment) HPI Comments: 46 yo female with history of alcoholism, cocaine abuse and HTN presents after blacking out last night. She has been drinking heavily daily, up to 3 x 40oz. Also states she may have done some cocaine last evening. She doesn't recall events, but woke up on the street corner feeling shaky. She called EMS, desires to detox from alcohol, which she has done previously in her 32 years of being an alcoholic. States she saw her psychiatrist at [facility] yesterday and has been compliant with her medications.   Having some blurred vision and headache. Denies chest pain, dyspnea, leg swelling, anxiety, hallucinations.    Past Medical History  Diagnosis Date  . Diabetes mellitus   . Hypertension   . Asthma   . High cholesterol   . Depression   . Gout   . Anxiety   . Bipolar 1 disorder     Past Surgical History  Procedure Date  . Joint replacement     History reviewed. No pertinent family history.  History  Substance Use Topics  . Smoking status: Never Smoker   . Smokeless tobacco: Never Used  . Alcohol Use: 7.2 oz/week    12 Cans of beer per week     drinks 4- 40oz/day    OB History    Grav Para Term Preterm Abortions TAB SAB Ect Mult Living                  Review of Systems  Constitutional: Negative for fever.  Eyes: Negative for pain.  Respiratory: Negative for chest tightness and shortness of breath.   Gastrointestinal: Negative for abdominal pain.  Genitourinary: Negative for difficulty urinating.  Skin: Negative for rash.  Neurological: Positive for tremors and light-headedness.  Psychiatric/Behavioral: Positive for confusion.  All other systems reviewed and  are negative.    Allergies  Penicillins; Chocolate; and Orange  Home Medications   Current Outpatient Rx  Name Route Sig Dispense Refill  . ALBUTEROL SULFATE HFA 108 (90 BASE) MCG/ACT IN AERS Inhalation Inhale 2 puffs into the lungs every 6 (six) hours as needed.    . ATENOLOL 50 MG PO TABS Oral Take 50 mg by mouth daily.      . BUPROPION HCL ER (SR) 150 MG PO TB12 Oral Take 150 mg by mouth 2 (two) times daily. For depression.    Marland Kitchen CITALOPRAM HYDROBROMIDE 20 MG PO TABS Oral Take 20 mg by mouth daily.    Marland Kitchen FLUTICASONE-SALMETEROL 250-50 MCG/DOSE IN AEPB Inhalation Inhale 2 puffs into the lungs every 12 (twelve) hours.     Marland Kitchen LAMOTRIGINE 200 MG PO TABS Oral Take 1 tablet (200 mg total) by mouth at bedtime. 30 tablet 0  . ADULT MULTIVITAMIN W/MINERALS CH Oral Take 1 tablet by mouth daily.      . TRAZODONE HCL 150 MG PO TABS Oral Take 300 mg by mouth at bedtime.    . TRIAMTERENE-HCTZ 37.5-25 MG PO CAPS Oral Take 1 each (1 capsule total) by mouth every morning. For blood pressure      BP 175/125  Pulse 71  Temp 98.1 F (36.7 C) (Oral)  Resp 18  SpO2 100%  Physical Exam  Vitals reviewed. Constitutional: She  is oriented to person, place, and time. She appears well-developed and well-nourished. No distress.  HENT:  Head: Normocephalic and atraumatic.  Mouth/Throat: Oropharynx is clear and moist. No oropharyngeal exudate.  Eyes: EOM are normal. Pupils are equal, round, and reactive to light.  Neck: Neck supple.  Cardiovascular: Normal rate, regular rhythm, normal heart sounds and intact distal pulses.   No murmur heard. Pulmonary/Chest: Effort normal and breath sounds normal. No respiratory distress. She has no wheezes. She has no rales.  Abdominal: Soft. Bowel sounds are normal.  Musculoskeletal: She exhibits no edema and no tenderness.  Neurological: She is alert and oriented to person, place, and time. She has normal reflexes. No cranial nerve deficit. She exhibits normal muscle  tone. Coordination normal.  Skin: No rash noted. She is diaphoretic.  Psychiatric: She has a normal mood and affect. Her behavior is normal. Judgment and thought content normal.    ED Course  Procedures (including critical care time)  Date: 12/05/2011  Rate: 88   Rhythm: normal sinus rhythm  QRS Axis: normal  Intervals: normal  ST/T Wave abnormalities: normal  Conduction Disutrbances:none  Narrative Interpretation:  LVH  Old EKG Reviewed: unchanged     Labs Reviewed  COMPREHENSIVE METABOLIC PANEL  CBC  URINALYSIS, ROUTINE W REFLEX MICROSCOPIC  URINE RAPID DRUG SCREEN (HOSP PERFORMED)   Results for orders placed during the hospital encounter of 12/05/11 (from the past 24 hour(s))  URINALYSIS, ROUTINE W REFLEX MICROSCOPIC     Status: Normal   Collection Time   12/05/11  9:05 AM      Component Value Range   Color, Urine YELLOW  YELLOW   APPearance CLEAR  CLEAR   Specific Gravity, Urine 1.014  1.005 - 1.030   pH 8.0  5.0 - 8.0   Glucose, UA NEGATIVE  NEGATIVE mg/dL   Hgb urine dipstick NEGATIVE  NEGATIVE   Bilirubin Urine NEGATIVE  NEGATIVE   Ketones, ur NEGATIVE  NEGATIVE mg/dL   Protein, ur NEGATIVE  NEGATIVE mg/dL   Urobilinogen, UA 0.2  0.0 - 1.0 mg/dL   Nitrite NEGATIVE  NEGATIVE   Leukocytes, UA NEGATIVE  NEGATIVE  URINE RAPID DRUG SCREEN (HOSP PERFORMED)     Status: Abnormal   Collection Time   12/05/11  9:05 AM      Component Value Range   Opiates NONE DETECTED  NONE DETECTED   Cocaine POSITIVE (*) NONE DETECTED   Benzodiazepines NONE DETECTED  NONE DETECTED   Amphetamines NONE DETECTED  NONE DETECTED   Tetrahydrocannabinol NONE DETECTED  NONE DETECTED   Barbiturates NONE DETECTED  NONE DETECTED  CBC     Status: Normal   Collection Time   12/05/11  9:15 AM      Component Value Range   WBC 5.6  4.0 - 10.5 K/uL   RBC 3.94  3.87 - 5.11 MIL/uL   Hemoglobin 12.1  12.0 - 15.0 g/dL   HCT 19.1  47.8 - 29.5 %   MCV 91.4  78.0 - 100.0 fL   MCH 30.7  26.0 - 34.0 pg    MCHC 33.6  30.0 - 36.0 g/dL   RDW 62.1  30.8 - 65.7 %   Platelets 247  150 - 400 K/uL     No results found.   No diagnosis found.    MDM  46 yo female with HTN and heavy alcohol use presents after alcohol and cocaine binge with a blackout episode, now with hypertensive urgency and desire to detox. Will start ativan  protocol, check basic labs. Give home med for hypertension and avoid beta blocker with +cocaine.   10:52 AM BP stabilized and given 1 dose ativan so far. Will give clonidine x 1 for better control currently. No indication of malignant hypertension, patient is medically stable. ACT team consulted for detox, will continue to work on BP and CIWA protocol in place.         Durwin Reges, MD 12/05/11 1451

## 2011-12-05 NOTE — ED Notes (Signed)
[id number]<AV> Expected date:12/05/11<BR> Expected time: 8:12 AM<BR> Means of arrival:Ambulance<BR> Comments:<BR> Female; Confused

## 2011-12-05 NOTE — ED Notes (Signed)
Belongings are under desk across from Exam room 4
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
## 2011-12-06 MED ORDER — TRIAMTERENE-HCTZ 37.5-25 MG PO TABS
1.0000 | ORAL_TABLET | Freq: Every day | ORAL | Status: DC
  Administered 2011-12-07 – 2011-12-09 (×3): 1 via ORAL
  Filled 2011-12-06 (×3): qty 1

## 2011-12-06 MED ORDER — BUPROPION HCL ER (SR) 150 MG PO TB12: 150.0000 mg | ORAL_TABLET | Freq: Two times a day (BID) | ORAL | Status: DC

## 2011-12-06 MED ORDER — DIPHENHYDRAMINE HCL 25 MG PO CAPS
50.0000 mg | ORAL_CAPSULE | Freq: Once | ORAL | Status: AC
  Administered 2011-12-06: 50 mg via ORAL
  Filled 2011-12-06: qty 2

## 2011-12-06 MED ORDER — LAMOTRIGINE 200 MG PO TABS
200.0000 mg | ORAL_TABLET | Freq: Every day | ORAL | Status: DC
  Administered 2011-12-06 – 2011-12-08 (×3): 200 mg via ORAL
  Filled 2011-12-06 (×4): qty 1

## 2011-12-06 MED ORDER — BUPROPION HCL ER (SR) 150 MG PO TB12
150.0000 mg | ORAL_TABLET | Freq: Two times a day (BID) | ORAL | Status: DC
  Administered 2011-12-06 – 2011-12-09 (×6): 150 mg via ORAL
  Filled 2011-12-06 (×7): qty 1

## 2011-12-06 MED ORDER — TRAZODONE HCL 100 MG PO TABS
300.0000 mg | ORAL_TABLET | Freq: Every day | ORAL | Status: DC
  Administered 2011-12-06 – 2011-12-08 (×3): 300 mg via ORAL
  Filled 2011-12-06 (×3): qty 3

## 2011-12-06 MED ORDER — CITALOPRAM HYDROBROMIDE 20 MG PO TABS
20.0000 mg | ORAL_TABLET | Freq: Every day | ORAL | Status: DC
  Administered 2011-12-06 – 2011-12-09 (×4): 20 mg via ORAL
  Filled 2011-12-06 (×4): qty 1

## 2011-12-06 MED ORDER — ATENOLOL 50 MG PO TABS
50.0000 mg | ORAL_TABLET | Freq: Every day | ORAL | Status: DC
  Administered 2011-12-06 – 2011-12-09 (×4): 50 mg via ORAL
  Filled 2011-12-06 (×4): qty 1

## 2011-12-06 MED ORDER — FAMOTIDINE 20 MG PO TABS
20.0000 mg | ORAL_TABLET | Freq: Once | ORAL | Status: AC
  Administered 2011-12-06: 20 mg via ORAL
  Filled 2011-12-06: qty 1
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
## 2011-12-06 NOTE — BHH Counselor (Signed)
Pt has been accepted to [facility] by Dr. Elsie Saas. However, no beds currently available on 400 hall.

## 2011-12-06 NOTE — ED Notes (Signed)
Disregard discharge info as it was entered on wrong pt.

## 2011-12-06 NOTE — ED Notes (Signed)
Attempted to give report to [facility] ED RN, receiving RN not available to take report at this time.

## 2011-12-06 NOTE — ED Notes (Signed)
Pt sts she had to used the bedside commode last night due to altered mental status from ETOH, sts she is more alert now that she can ambulate

## 2011-12-06 NOTE — ED Notes (Signed)
2 bags of belongings moved to locker 38

## 2011-12-06 NOTE — ED Notes (Signed)
Medicated as ordered. Dr. Patria Mane in to assess pt at this time.
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
## 2011-12-07 NOTE — ED Provider Notes (Signed)
BP 143/100  Pulse 59  Temp 98.1 F (36.7 C) (Oral)  Resp 18  SpO2 98% Pt with episodic elevated BP. Home meds started. No issues this AM. Accepted at [facility]. Awaiting 400 hall bed  Loren Racer, MD 12/07/11 [phone]

## 2011-12-08 NOTE — ED Provider Notes (Signed)
Pt resting comfortably. Vitals stable. Nad. Discussed w act team, awaiting 400 bed at [facility].  Suzi Roots, MD 12/08/11 [phone]

## 2011-12-09 DIAGNOSIS — F339 Major depressive disorder, recurrent, unspecified: Secondary | ICD-10-CM

## 2011-12-09 DIAGNOSIS — F1010 Alcohol abuse, uncomplicated: Secondary | ICD-10-CM

## 2011-12-09 NOTE — BHH Counselor (Signed)
Patient evaluated by psychiatrist Dr. Elsie Saas and recommends discharge home to follow up with current provider [facility] the [place]). He notes that patient does not meet criteria hospitalization at this time.  Writer provided patient with additional referrals to alternative facilities including ([facility], [facility], [facility], Support Groups etc. Patient nurse-Jennifer informed of patients discharge. Also contacted EDP (Dr. Linwood Dibbles) to update him on patients disposition. He agreed to discharge patient accordingly.

## 2011-12-09 NOTE — Discharge Instructions (Signed)
Depression  You have signs of depression. This is a common problem. It can occur at any age. It is often hard to recognize. People can suffer from depression and still have moments of enjoyment. Depression interferes with your basic ability to function in life. It upsets your relationships, sleep, eating, and work habits.  CAUSES   Depression is believed to be caused by an imbalance in brain chemicals. It may be triggered by an unpleasant event. Relationship crises, a death in the family, financial worries, retirement, or other stressors are normal causes of depression. Depression may also start for no known reason. Other factors that may play a part include medical illnesses, some medicines, genetics, and alcohol or drug abuse.  SYMPTOMS    Feeling unhappy or worthless.   Long-lasting (chronic) tiredness or worn-out feeling.   Self-destructive thoughts and actions.   Not being able to sleep or sleeping too much.   Eating more than usual or not eating at all.   Headaches or feeling anxious.   Trouble concentrating or making decisions.   Unexplained physical problems and substance abuse.  TREATMENT   Depression usually gets better with treatment. This can include:   Antidepressant medicines. It can take weeks before the proper dose is achieved and benefits are reached.   Talking with a therapist, clergyperson, counselor, or friend. These people can help you gain insight into your problem and regain control of your life.   Eating a good diet.   Getting regular physical exercise, such as walking for 30 minutes every day.   Not abusing alcohol or drugs.  Treating depression often takes 6 months or longer. This length of treatment is needed to keep symptoms from returning. Call your caregiver and arrange for follow-up care as suggested.  SEEK IMMEDIATE MEDICAL CARE IF:    You start to have thoughts of hurting yourself or others.   Call your local emergency services (911 in [place]   Go to your local  medical emergency department.   Call the National Suicide Prevention Lifeline: 1-800-273-TALK (1-800-273-8255).  Document Released: 06/02/2005 Document Revised: 05/22/2011 Document Reviewed: 11/02/2009  ExitCare Patient Information 2012 [employer].
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
## 2011-12-09 NOTE — ED Provider Notes (Addendum)
Filed Vitals:   12/09/11 0622  BP:   Pulse: 66  Temp: 98 F (36.7 C)  Resp: 16   CAr RRR.  Lungs CTA.  Awaiting bed at [facility].  Pt denies complaints this am.  Celene Kras, MD 12/09/11 0759  Pt assessed by Dr. Shela Commons.   Pt stable for discharge  Celene Kras, MD 12/09/11 [phone]

## 2012-02-05 ENCOUNTER — Other Ambulatory Visit: Payer: Self-pay | Admitting: Obstetrics and Gynecology

## 2012-02-05 DIAGNOSIS — R928 Other abnormal and inconclusive findings on diagnostic imaging of breast: Secondary | ICD-10-CM

## 2012-02-10 ENCOUNTER — Ambulatory Visit (HOSPITAL_COMMUNITY): Payer: Self-pay

## 2012-02-18 ENCOUNTER — Encounter (HOSPITAL_COMMUNITY): Payer: Self-pay | Admitting: Emergency Medicine

## 2012-02-18 ENCOUNTER — Inpatient Hospital Stay (HOSPITAL_COMMUNITY)
Admission: AD | Admit: 2012-02-18 | Discharge: 2012-02-27 | DRG: 885 | Disposition: A | Discharge status: Home / Self Care | Payer: No Typology Code available for payment source | Source: Ambulatory Visit | Attending: Psychiatry | Admitting: Psychiatry

## 2012-02-18 ENCOUNTER — Emergency Department (HOSPITAL_COMMUNITY)
Admission: EM | Admit: 2012-02-18 | Discharge: 2012-02-18 | Disposition: A | Discharge status: Other Acute Inpatient Hospital | Payer: Self-pay | Attending: Emergency Medicine | Admitting: Emergency Medicine

## 2012-02-18 DIAGNOSIS — IMO0002 Reserved for concepts with insufficient information to code with codable children: Secondary | ICD-10-CM | POA: Diagnosis present

## 2012-02-18 DIAGNOSIS — I10 Essential (primary) hypertension: Secondary | ICD-10-CM | POA: Insufficient documentation

## 2012-02-18 DIAGNOSIS — E119 Type 2 diabetes mellitus without complications: Secondary | ICD-10-CM | POA: Diagnosis present

## 2012-02-18 DIAGNOSIS — F259 Schizoaffective disorder, unspecified: Principal | ICD-10-CM | POA: Diagnosis present

## 2012-02-18 DIAGNOSIS — F1010 Alcohol abuse, uncomplicated: Secondary | ICD-10-CM | POA: Insufficient documentation

## 2012-02-18 DIAGNOSIS — Z79899 Other long term (current) drug therapy: Secondary | ICD-10-CM | POA: Insufficient documentation

## 2012-02-18 DIAGNOSIS — F319 Bipolar disorder, unspecified: Secondary | ICD-10-CM | POA: Insufficient documentation

## 2012-02-18 DIAGNOSIS — M109 Gout, unspecified: Secondary | ICD-10-CM | POA: Insufficient documentation

## 2012-02-18 DIAGNOSIS — F1410 Cocaine abuse, uncomplicated: Secondary | ICD-10-CM

## 2012-02-18 DIAGNOSIS — K219 Gastro-esophageal reflux disease without esophagitis: Secondary | ICD-10-CM | POA: Diagnosis present

## 2012-02-18 DIAGNOSIS — F251 Schizoaffective disorder, depressive type: Secondary | ICD-10-CM

## 2012-02-18 DIAGNOSIS — M222X9 Patellofemoral disorders, unspecified knee: Secondary | ICD-10-CM

## 2012-02-18 DIAGNOSIS — J45909 Unspecified asthma, uncomplicated: Secondary | ICD-10-CM | POA: Insufficient documentation

## 2012-02-18 DIAGNOSIS — M1990 Unspecified osteoarthritis, unspecified site: Secondary | ICD-10-CM | POA: Diagnosis present

## 2012-02-18 DIAGNOSIS — R55 Syncope and collapse: Secondary | ICD-10-CM | POA: Diagnosis not present

## 2012-02-18 DIAGNOSIS — E7800 Pure hypercholesterolemia, unspecified: Secondary | ICD-10-CM | POA: Diagnosis present

## 2012-02-18 DIAGNOSIS — R45851 Suicidal ideations: Secondary | ICD-10-CM | POA: Insufficient documentation

## 2012-02-18 DIAGNOSIS — M25569 Pain in unspecified knee: Secondary | ICD-10-CM

## 2012-02-18 DIAGNOSIS — Z9149 Other personal history of psychological trauma, not elsewhere classified: Secondary | ICD-10-CM

## 2012-02-18 HISTORY — DX: Gastro-esophageal reflux disease without esophagitis: K21.9

## 2012-02-18 LAB — COMPREHENSIVE METABOLIC PANEL
Albumin: 4.1 g/dL (ref 3.5–5.2)
BUN: 7 mg/dL (ref 6–23)
Chloride: 101 mEq/L (ref 96–112)
Creatinine, Ser: 0.78 mg/dL (ref 0.50–1.10)
GFR calc non Af Amer: >90 mL/min (ref >90)
Total Bilirubin: 1 mg/dL (ref 0.3–1.2)

## 2012-02-18 LAB — CBC WITH DIFFERENTIAL/PLATELET
Basophils Relative: 0 % (ref 0–1)
Eosinophils Relative: 1 % (ref 0–5)
HCT: 38.6 % (ref 36.0–46.0)
Hemoglobin: 12.7 g/dL (ref 12.0–15.0)
MCH: 29.9 pg (ref 26.0–34.0)
MCHC: 32.9 g/dL (ref 30.0–36.0)
MCV: 90.8 fL (ref 78.0–100.0)
Monocytes Absolute: 0.4 10*3/uL (ref 0.1–1.0)
Monocytes Relative: 6 % (ref 3–12)
Neutro Abs: 4.1 10*3/uL (ref 1.7–7.7)

## 2012-02-18 LAB — URINALYSIS, ROUTINE W REFLEX MICROSCOPIC
Glucose, UA: NEGATIVE mg/dL
Protein, ur: NEGATIVE mg/dL
Urobilinogen, UA: 1 mg/dL (ref 0.0–1.0)

## 2012-02-18 LAB — RAPID URINE DRUG SCREEN, HOSP PERFORMED: Barbiturates: NOT DETECTED

## 2012-02-18 LAB — PREGNANCY, URINE: Preg Test, Ur: NEGATIVE

## 2012-02-18 MED ORDER — LORAZEPAM 1 MG PO TABS
1.0000 mg | ORAL_TABLET | Freq: Once | ORAL | Status: AC
  Administered 2012-02-18: 1 mg via ORAL
  Filled 2012-02-18: qty 1

## 2012-02-18 MED ORDER — FAMOTIDINE 20 MG PO TABS
10.0000 mg | ORAL_TABLET | Freq: Every day | ORAL | Status: DC
  Administered 2012-02-18: 10 mg via ORAL
  Filled 2012-02-18: qty 2

## 2012-02-18 MED ORDER — RISPERIDONE 2 MG PO TABS
2.0000 mg | ORAL_TABLET | Freq: Every day | ORAL | Status: DC
  Administered 2012-02-18: 2 mg via ORAL
  Filled 2012-02-18: qty 1

## 2012-02-18 MED ORDER — IBUPROFEN 600 MG PO TABS
600.0000 mg | ORAL_TABLET | Freq: Four times a day (QID) | ORAL | Status: DC | PRN
  Administered 2012-02-18: 600 mg via ORAL
  Filled 2012-02-18: qty 3

## 2012-02-18 MED ORDER — LORAZEPAM 1 MG PO TABS
1.0000 mg | ORAL_TABLET | Freq: Three times a day (TID) | ORAL | Status: DC | PRN
  Administered 2012-02-18: 1 mg via ORAL
  Filled 2012-02-18: qty 1

## 2012-02-18 MED ORDER — PANTOPRAZOLE SODIUM 40 MG PO TBEC
40.0000 mg | DELAYED_RELEASE_TABLET | Freq: Every day | ORAL | Status: DC
  Administered 2012-02-18: 40 mg via ORAL
  Filled 2012-02-18: qty 1

## 2012-02-18 MED ORDER — CITALOPRAM HYDROBROMIDE 20 MG PO TABS
20.0000 mg | ORAL_TABLET | Freq: Every day | ORAL | Status: DC
  Administered 2012-02-18: 20 mg via ORAL
  Filled 2012-02-18: qty 1

## 2012-02-18 MED ORDER — IBUPROFEN 200 MG PO TABS
600.0000 mg | ORAL_TABLET | Freq: Once | ORAL | Status: AC
  Administered 2012-02-18: 600 mg via ORAL
  Filled 2012-02-18: qty 3

## 2012-02-18 MED ORDER — BUPROPION HCL ER (SR) 150 MG PO TB12
150.0000 mg | ORAL_TABLET | Freq: Two times a day (BID) | ORAL | Status: DC
  Administered 2012-02-18 (×2): 150 mg via ORAL
  Filled 2012-02-18 (×2): qty 1

## 2012-02-18 MED ORDER — LAMOTRIGINE 200 MG PO TABS
200.0000 mg | ORAL_TABLET | Freq: Every day | ORAL | Status: DC
  Administered 2012-02-18: 200 mg via ORAL
  Filled 2012-02-18: qty 1

## 2012-02-18 MED ORDER — TRIAMTERENE-HCTZ 37.5-25 MG PO TABS
1.0000 | ORAL_TABLET | Freq: Every day | ORAL | Status: DC
  Administered 2012-02-18: 1 via ORAL
  Filled 2012-02-18: qty 1

## 2012-02-18 MED ORDER — ATENOLOL 50 MG PO TABS
50.0000 mg | ORAL_TABLET | Freq: Every day | ORAL | Status: DC
  Administered 2012-02-18: 50 mg via ORAL
  Filled 2012-02-18: qty 1

## 2012-02-18 MED ORDER — TRAZODONE HCL 100 MG PO TABS
300.0000 mg | ORAL_TABLET | Freq: Every day | ORAL | Status: DC
  Administered 2012-02-18: 300 mg via ORAL
  Filled 2012-02-18: qty 3

## 2012-02-18 NOTE — BH Assessment (Addendum)
Assessment Note   Maureen Swanson is a 46 y.o. female who presents to [facility] voluntarily requesting detox from alcohol. Pt reports drinking 3-4 40 oz beers daily, on and off for the past 14 years. She reports she attempted detox in June/2013 and was discharged from the ED. She reports seeking treatment at this time was precipitated by frequent arguments with her partner that pt feels is due to alcohol. She states she feels that if she can stop drinking the argument will decrease.   Pt endorses SI with no current plan, however, she states she attempted to overdose on medication 2 weeks ago. She also endorses [facility], stating she hears voices that are telling her to hurt herself, and sometimes sees 2 people. She endorses HI with no plan and no intended victim. She reports 2 prior suicide attempts and numerous prior hospitalizations. She reports she receives outpatient treatment from [facility] [place], but has bee non-compliant with medications. Pt currently lives with her partner and report little other social support. She reports she can not contract for safety at this time.      Axis I: Mood Disorder NOS and Alcohol Dependence Axis II: Deferred Axis III:  Past Medical History  Diagnosis Date  . Diabetes mellitus   . Hypertension   . Asthma   . High cholesterol   . Depression   . Gout   . Anxiety   . Bipolar 1 disorder    Axis IV: other psychosocial or environmental problems, problems related to social environment and problems with primary support group Axis V: 31-40 impairment in reality testing  Past Medical History:  Past Medical History  Diagnosis Date  . Diabetes mellitus   . Hypertension   . Asthma   . High cholesterol   . Depression   . Gout   . Anxiety   . Bipolar 1 disorder     Past Surgical History  Procedure Date  . Joint replacement     Family History: History reviewed. No pertinent family history.  Social History:  reports that she has never smoked. She  has never used smokeless tobacco. She reports that she drinks about 7.2 ounces of alcohol per week. She reports that she uses illicit drugs (Cocaine) about once per week.  Additional Social History:  Alcohol / Drug Use History of alcohol / drug use?: Yes Substance #1 Name of Substance 1: alcohol 1 - Age of First Use: teens 1 - Amount (size/oz): 3-4 40 oz beers 1 - Frequency: daily 1 - Duration: on and off for 14 years, 2 months since last detox 1 - Last Use / Amount: 02/18/12  CIWA: CIWA-Ar BP: 127/87 mmHg Pulse Rate: 103  Nausea and Vomiting: no nausea and no vomiting Tactile Disturbances: none Tremor: two Auditory Disturbances: not present Paroxysmal Sweats: no sweat visible Visual Disturbances: not present Anxiety: mildly anxious Headache, Fullness in Head: very mild Agitation: somewhat more than normal activity Orientation and Clouding of Sensorium: oriented and can do serial additions CIWA-Ar Total: 5  COWS:    Allergies:  Allergies  Allergen Reactions  . Penicillins Hives  . Chocolate Hives  . Orange Hives    Home Medications:  (Not in a hospital admission)  OB/GYN Status:  No LMP recorded. Patient is postmenopausal.  General Assessment Data Location of Assessment: [facility] ED Living Arrangements: Spouse/significant other Can pt return to current living arrangement?: Yes Admission Status: Voluntary Is patient capable of signing voluntary admission?: Yes Transfer from: Acute Hospital Referral Source: MD  Education Status Is patient currently in school?: No  Risk to self Suicidal Ideation: Yes-Currently Present Suicidal Intent: No-Not Currently/Within Last 6 Months Is patient at risk for suicide?: Yes Suicidal Plan?: No Access to Means: No What has been your use of drugs/alcohol within the last 12 months?: alcohol Previous Attempts/Gestures: Yes How many times?: 2  Other Self Harm Risks: none Triggers for Past Attempts: Family contact;Other personal  contacts Intentional Self Injurious Behavior: None Family Suicide History: No Recent stressful life event(s): Conflict (Comment) (argument with partner) Persecutory voices/beliefs?: No Depression: Yes Depression Symptoms: Loss of interest in usual pleasures;Fatigue;Feeling worthless/self pity Substance abuse history and/or treatment for substance abuse?: Yes Suicide prevention information given to non-admitted patients: Not applicable  Risk to Others Homicidal Ideation: Yes-Currently Present Thoughts of Harm to Others: No-Not Currently Present/Within Last 6 Months Current Homicidal Intent: No Current Homicidal Plan: No Access to Homicidal Means: No Identified Victim: none History of harm to others?: No Assessment of Violence: None Noted Violent Behavior Description: cooperative Does patient have access to weapons?: No Criminal Charges Pending?: No Does patient have a court date: No  Psychosis Hallucinations: Auditory;Visual Delusions: None noted  Mental Status Report Appear/Hygiene: Disheveled Eye Contact: Poor Motor Activity: Unremarkable Speech: Slow;Slurred Level of Consciousness: Drowsy Mood: Depressed Affect: Appropriate to circumstance Anxiety Level: None Thought Processes: Coherent;Relevant Judgement: Impaired Orientation: Place;Person;Time;Situation Obsessive Compulsive Thoughts/Behaviors: None  Cognitive Functioning Concentration: Normal Memory: Recent Intact;Remote Intact IQ: Average Insight: Poor Impulse Control: Poor Appetite: Fair Weight Loss: 0  Weight Gain: 0  Sleep: No Change Vegetative Symptoms: None  ADLScreening [facility]) Patient's cognitive ability adequate to safely complete daily activities?: Yes Patient able to express need for assistance with ADLs?: Yes Independently performs ADLs?: Yes (appropriate for developmental age)  Abuse/Neglect [facility]) Physical Abuse: Denies Verbal Abuse: Denies Sexual Abuse: Denies  Prior  Inpatient Therapy Prior Inpatient Therapy: Yes Prior Therapy Dates: 2013 Prior Therapy Facilty/Provider(s): [facility] Reason for Treatment: detox  Prior Outpatient Therapy Prior Outpatient Therapy: Yes Prior Therapy Dates: ongoing Prior Therapy Facilty[facility] Reason for Treatment: medication management  ADL Screening (condition at time of admission) Patient's cognitive ability adequate to safely complete daily activities?: Yes Patient able to express need for assistance with ADLs?: Yes Independently performs ADLs?: Yes (appropriate for developmental age) Weakness of Legs: None Weakness of Arms/Hands: None  Home Assistive Devices/Equipment Home Assistive Devices/Equipment: None    Abuse/Neglect Assessment (Assessment to be complete while patient is alone) Physical Abuse: Denies Verbal Abuse: Denies Sexual Abuse: Denies Exploitation of patient/patient's resources: Denies Self-Neglect: Denies Values / Beliefs Cultural Requests During Hospitalization: None Spiritual Requests During Hospitalization: None   Advance Directives (For Healthcare) Advance Directive: Patient does not have advance directive;Patient would not like information Pre-existing out of facility DNR order (yellow form or pink MOST form): No    Additional Information 1:1 In Past 12 Months?: No CIRT Risk: No Elopement Risk: No Does patient have medical clearance?: Yes     Disposition:  Disposition Disposition of Patient: Referred to;Inpatient treatment program Type of inpatient treatment program: Adult Patient referred to:  [facility]) Pt accepted to [facility] to [id number] by Simon to Dr. Allena Katz. EDP notified and agrees with plan. Support paperwork has been singed and faxed to [facility]. On Site Evaluation by:   Reviewed with Physician:     Georgina Quint A 02/18/2012 8:57 PM

## 2012-02-18 NOTE — ED Provider Notes (Signed)
She has been accepted at the behavioral health hospital.   Flint Melter, MD 02/18/12 2302

## 2012-02-18 NOTE — ED Notes (Signed)
ZOX:[id number]<AV> Expected date:02/18/12<BR> Expected time: 7:48 AM<BR> Means of arrival:Ambulance<BR> Comments:<BR> Hypertensive; chronic back pain

## 2012-02-18 NOTE — ED Provider Notes (Signed)
History     CSN: [id number]  Arrival date & time 02/18/12  0807   First MD Initiated Contact with Patient 02/18/12 [phone]      Chief Complaint  Patient presents with  . Back Pain    out of pain meds  . Hypertension    out of BP meds  . Medical Clearance    (Consider location/radiation/quality/duration/timing/severity/associated sxs/prior treatment) HPI Pt has history of bipolar and depression has been out of her meds >105month. Out of HTN and chronic pain meds as well. States she wants detox for alcohol abuse and that she was having SI last night. She drinks daily and last drank last night. States she was thinking of overdosing on medication. Has ongoing, chronic low back pain. No HA, or focal weakness.  Past Medical History  Diagnosis Date  . Diabetes mellitus   . Hypertension   . Asthma   . High cholesterol   . Depression   . Gout   . Anxiety   . Bipolar 1 disorder     Past Surgical History  Procedure Date  . Joint replacement     History reviewed. No pertinent family history.  History  Substance Use Topics  . Smoking status: Never Smoker   . Smokeless tobacco: Never Used  . Alcohol Use: 7.2 oz/week    12 Cans of beer per week     drinks 4- 40oz/day    OB History    Grav Para Term Preterm Abortions TAB SAB Ect Mult Living                  Review of Systems  Constitutional: Negative for fever and chills.  HENT: Negative for neck pain.   Eyes: Negative for visual disturbance.  Respiratory: Negative for shortness of breath.   Cardiovascular: Negative for chest pain.  Gastrointestinal: Negative for nausea, vomiting and abdominal pain.  Genitourinary: Negative for dysuria, frequency and flank pain.  Musculoskeletal: Positive for back pain.  Skin: Negative for rash.  Neurological: Positive for tremors. Negative for dizziness, weakness, light-headedness, numbness and headaches.  Psychiatric/Behavioral: Positive for suicidal ideas. Negative for self-injury. The  patient is not nervous/anxious.     Allergies  Penicillins; Chocolate; and Orange  Home Medications   Current Outpatient Rx  Name Route Sig Dispense Refill  . ALBUTEROL SULFATE HFA 108 (90 BASE) MCG/ACT IN AERS Inhalation Inhale 2 puffs into the lungs every 6 (six) hours as needed. For shortness of breath.    . ATENOLOL 50 MG PO TABS Oral Take 50 mg by mouth daily.      . BUPROPION HCL ER (SR) 150 MG PO TB12 Oral Take 150 mg by mouth 2 (two) times daily. For depression.    Marland Kitchen CITALOPRAM HYDROBROMIDE 20 MG PO TABS Oral Take 20 mg by mouth daily.    Marland Kitchen FLUTICASONE-SALMETEROL 250-50 MCG/DOSE IN AEPB Inhalation Inhale 2 puffs into the lungs every 12 (twelve) hours.     Marland Kitchen LAMOTRIGINE 200 MG PO TABS Oral Take 1 tablet (200 mg total) by mouth at bedtime. 30 tablet 0  . ADULT MULTIVITAMIN W/MINERALS CH Oral Take 1 tablet by mouth daily.      . TRAZODONE HCL 150 MG PO TABS Oral Take 300 mg by mouth at bedtime.    . TRIAMTERENE-HCTZ 37.5-25 MG PO CAPS Oral Take 1 capsule by mouth daily.      BP 160/102  Pulse 80  Temp 98.6 F (37 C) (Oral)  Resp 22  SpO2 96%  Physical Exam  Nursing note and vitals reviewed. Constitutional: She is oriented to person, place, and time. She appears well-developed and well-nourished. No distress.  HENT:  Head: Normocephalic and atraumatic.  Mouth/Throat: Oropharynx is clear and moist.  Eyes: EOM are normal. Pupils are equal, round, and reactive to light.  Neck: Normal range of motion. Neck supple.  Cardiovascular: Normal rate and regular rhythm.   Pulmonary/Chest: Effort normal and breath sounds normal. No respiratory distress. She has no wheezes. She has no rales.  Abdominal: Soft. Bowel sounds are normal. There is no tenderness. There is no rebound and no guarding.  Musculoskeletal: Normal range of motion. She exhibits no edema and no tenderness.  Neurological: She is alert and oriented to person, place, and time.       5/5 motor, sensation intact, fine  tremor.   Skin: Skin is warm and dry. No rash noted. No erythema.  Psychiatric: She has a normal mood and affect. Her behavior is normal.    ED Course  Procedures (including critical care time)   Labs Reviewed  CBC WITH DIFFERENTIAL  COMPREHENSIVE METABOLIC PANEL  URINALYSIS, ROUTINE W REFLEX MICROSCOPIC  PREGNANCY, URINE  ETHANOL  URINE RAPID DRUG SCREEN (HOSP PERFORMED)   No results found.   No diagnosis found.    MDM  Will medically clear, restart medications and have ACT eval.   Medically clear. BP improved with oral meds. Will move to psych ED pending ACT        Loren Racer, MD 02/18/12 1511

## 2012-02-18 NOTE — ED Notes (Signed)
To ED via [facility] with c/o back pain, chronic- out of meds for three days, hypertensive-- no meds for three days. No recent trauma, no injury. Ambulatory on arrival from EMS stretcher to ED bed.

## 2012-02-19 ENCOUNTER — Encounter (HOSPITAL_COMMUNITY): Payer: Self-pay | Admitting: *Deleted

## 2012-02-19 DIAGNOSIS — F1010 Alcohol abuse, uncomplicated: Secondary | ICD-10-CM | POA: Diagnosis present

## 2012-02-19 DIAGNOSIS — IMO0002 Reserved for concepts with insufficient information to code with codable children: Secondary | ICD-10-CM | POA: Diagnosis present

## 2012-02-19 DIAGNOSIS — F251 Schizoaffective disorder, depressive type: Secondary | ICD-10-CM | POA: Diagnosis present

## 2012-02-19 DIAGNOSIS — F1410 Cocaine abuse, uncomplicated: Secondary | ICD-10-CM | POA: Diagnosis present

## 2012-02-19 LAB — TSH: TSH: 0.794 u[IU]/mL (ref 0.350–4.500)

## 2012-02-19 LAB — T4, FREE: Free T4: 1.1 ng/dL (ref 0.80–1.80)

## 2012-02-19 LAB — GLUCOSE, CAPILLARY

## 2012-02-19 MED ORDER — ACETAMINOPHEN 325 MG PO TABS
650.0000 mg | ORAL_TABLET | Freq: Four times a day (QID) | ORAL | Status: DC | PRN
  Administered 2012-02-20 – 2012-02-24 (×3): 650 mg via ORAL

## 2012-02-19 MED ORDER — RISPERIDONE 2 MG PO TABS
2.0000 mg | ORAL_TABLET | Freq: Every day | ORAL | Status: DC
  Filled 2012-02-19: qty 1

## 2012-02-19 MED ORDER — THIAMINE HCL 100 MG/ML IJ SOLN: 100.0000 mg | Freq: Once | INTRAMUSCULAR | Status: DC

## 2012-02-19 MED ORDER — BUPROPION HCL ER (SR) 150 MG PO TB12
150.0000 mg | ORAL_TABLET | ORAL | Status: DC
  Administered 2012-02-20 – 2012-02-27 (×16): 150 mg via ORAL
  Filled 2012-02-19 (×4): qty 1
  Filled 2012-02-19: qty 28
  Filled 2012-02-19 (×7): qty 1
  Filled 2012-02-19 (×2): qty 28
  Filled 2012-02-19 (×8): qty 1
  Filled 2012-02-19: qty 28
  Filled 2012-02-19: qty 1

## 2012-02-19 MED ORDER — BOOST / RESOURCE BREEZE PO LIQD
1.0000 | Freq: Two times a day (BID) | ORAL | Status: DC
  Administered 2012-02-19 – 2012-02-27 (×14): 1 via ORAL
  Filled 2012-02-19 (×20): qty 1

## 2012-02-19 MED ORDER — CHLORDIAZEPOXIDE HCL 25 MG PO CAPS: 25.0000 mg | ORAL_CAPSULE | Freq: Four times a day (QID) | ORAL | Status: AC | PRN

## 2012-02-19 MED ORDER — VITAMIN B-1 100 MG PO TABS
100.0000 mg | ORAL_TABLET | Freq: Every day | ORAL | Status: DC
  Administered 2012-02-20 – 2012-02-27 (×8): 100 mg via ORAL
  Filled 2012-02-19 (×10): qty 1

## 2012-02-19 MED ORDER — RISPERIDONE 3 MG PO TABS
3.0000 mg | ORAL_TABLET | Freq: Every day | ORAL | Status: DC
  Administered 2012-02-19 – 2012-02-22 (×4): 3 mg via ORAL
  Filled 2012-02-19 (×7): qty 1

## 2012-02-19 MED ORDER — CHLORDIAZEPOXIDE HCL 25 MG PO CAPS
25.0000 mg | ORAL_CAPSULE | Freq: Four times a day (QID) | ORAL | Status: AC
  Administered 2012-02-19 – 2012-02-20 (×6): 25 mg via ORAL
  Filled 2012-02-19 (×6): qty 1

## 2012-02-19 MED ORDER — CITALOPRAM HYDROBROMIDE 20 MG PO TABS
20.0000 mg | ORAL_TABLET | Freq: Every day | ORAL | Status: DC
  Administered 2012-02-19 – 2012-02-23 (×5): 20 mg via ORAL
  Filled 2012-02-19 (×8): qty 1

## 2012-02-19 MED ORDER — NAPROXEN 250 MG PO TABS
250.0000 mg | ORAL_TABLET | Freq: Two times a day (BID) | ORAL | Status: DC
  Administered 2012-02-19 – 2012-02-27 (×15): 250 mg via ORAL
  Filled 2012-02-19 (×5): qty 1
  Filled 2012-02-19: qty 28
  Filled 2012-02-19 (×4): qty 1
  Filled 2012-02-19: qty 28
  Filled 2012-02-19 (×2): qty 1
  Filled 2012-02-19 (×2): qty 28
  Filled 2012-02-19 (×7): qty 1

## 2012-02-19 MED ORDER — FLUTICASONE-SALMETEROL 250-50 MCG/DOSE IN AEPB
1.0000 | INHALATION_SPRAY | Freq: Two times a day (BID) | RESPIRATORY_TRACT | Status: DC
  Administered 2012-02-19 – 2012-02-27 (×16): 1 via RESPIRATORY_TRACT
  Filled 2012-02-19 (×3): qty 14

## 2012-02-19 MED ORDER — CHLORDIAZEPOXIDE HCL 25 MG PO CAPS
25.0000 mg | ORAL_CAPSULE | Freq: Every day | ORAL | Status: AC
  Administered 2012-02-23: 25 mg via ORAL
  Filled 2012-02-19: qty 1

## 2012-02-19 MED ORDER — MAGNESIUM HYDROXIDE 400 MG/5ML PO SUSP: 30.0000 mL | Freq: Every day | ORAL | Status: DC | PRN

## 2012-02-19 MED ORDER — CHLORDIAZEPOXIDE HCL 25 MG PO CAPS: 50.0000 mg | ORAL_CAPSULE | Freq: Once | ORAL | Status: DC

## 2012-02-19 MED ORDER — CHLORDIAZEPOXIDE HCL 25 MG PO CAPS
25.0000 mg | ORAL_CAPSULE | Freq: Three times a day (TID) | ORAL | Status: AC
  Administered 2012-02-20 – 2012-02-21 (×3): 25 mg via ORAL
  Filled 2012-02-19 (×3): qty 1

## 2012-02-19 MED ORDER — ONDANSETRON 4 MG PO TBDP: 4.0000 mg | ORAL_TABLET | Freq: Four times a day (QID) | ORAL | Status: AC | PRN

## 2012-02-19 MED ORDER — ADULT MULTIVITAMIN W/MINERALS CH
1.0000 | ORAL_TABLET | Freq: Every day | ORAL | Status: DC
  Administered 2012-02-19: 1 via ORAL
  Filled 2012-02-19 (×2): qty 1

## 2012-02-19 MED ORDER — ALBUTEROL SULFATE HFA 108 (90 BASE) MCG/ACT IN AERS: 2.0000 | INHALATION_SPRAY | Freq: Four times a day (QID) | RESPIRATORY_TRACT | Status: DC | PRN

## 2012-02-19 MED ORDER — PANTOPRAZOLE SODIUM 40 MG PO TBEC
40.0000 mg | DELAYED_RELEASE_TABLET | Freq: Every day | ORAL | Status: DC
  Administered 2012-02-20 – 2012-02-26 (×7): 40 mg via ORAL
  Filled 2012-02-19 (×5): qty 1
  Filled 2012-02-19: qty 7
  Filled 2012-02-19 (×3): qty 1
  Filled 2012-02-19: qty 7

## 2012-02-19 MED ORDER — LOPERAMIDE HCL 2 MG PO CAPS: 2.0000 mg | ORAL_CAPSULE | ORAL | Status: AC | PRN

## 2012-02-19 MED ORDER — PANTOPRAZOLE SODIUM 40 MG PO TBEC
40.0000 mg | DELAYED_RELEASE_TABLET | Freq: Every day | ORAL | Status: DC
  Administered 2012-02-19: 40 mg via ORAL
  Filled 2012-02-19 (×3): qty 1

## 2012-02-19 MED ORDER — HYDROCHLOROTHIAZIDE 25 MG PO TABS
25.0000 mg | ORAL_TABLET | Freq: Every day | ORAL | Status: DC
  Administered 2012-02-19 – 2012-02-27 (×9): 25 mg via ORAL
  Filled 2012-02-19: qty 7
  Filled 2012-02-19: qty 1
  Filled 2012-02-19: qty 7
  Filled 2012-02-19 (×10): qty 1

## 2012-02-19 MED ORDER — NICOTINE 21 MG/24HR TD PT24
21.0000 mg | MEDICATED_PATCH | Freq: Every day | TRANSDERMAL | Status: DC
  Administered 2012-02-19 – 2012-02-27 (×9): 21 mg via TRANSDERMAL
  Filled 2012-02-19 (×11): qty 1

## 2012-02-19 MED ORDER — BUPROPION HCL ER (SR) 150 MG PO TB12
150.0000 mg | ORAL_TABLET | ORAL | Status: DC
  Filled 2012-02-19: qty 1

## 2012-02-19 MED ORDER — TRAZODONE HCL 50 MG PO TABS
50.0000 mg | ORAL_TABLET | Freq: Every evening | ORAL | Status: DC | PRN
  Filled 2012-02-19 (×4): qty 1

## 2012-02-19 MED ORDER — CHLORDIAZEPOXIDE HCL 25 MG PO CAPS
25.0000 mg | ORAL_CAPSULE | ORAL | Status: AC
  Administered 2012-02-21 – 2012-02-22 (×2): 25 mg via ORAL
  Filled 2012-02-19 (×2): qty 1

## 2012-02-19 MED ORDER — ATENOLOL 50 MG PO TABS
50.0000 mg | ORAL_TABLET | Freq: Every day | ORAL | Status: DC
  Administered 2012-02-19 – 2012-02-27 (×9): 50 mg via ORAL
  Filled 2012-02-19 (×2): qty 1
  Filled 2012-02-19: qty 7
  Filled 2012-02-19 (×5): qty 1
  Filled 2012-02-19: qty 7
  Filled 2012-02-19 (×4): qty 1

## 2012-02-19 MED ORDER — NAPROXEN SODIUM 275 MG PO TABS: 275.0000 mg | ORAL_TABLET | Freq: Two times a day (BID) | ORAL | Status: DC

## 2012-02-19 MED ORDER — TRAZODONE HCL 100 MG PO TABS
300.0000 mg | ORAL_TABLET | Freq: Every day | ORAL | Status: DC
  Administered 2012-02-19 – 2012-02-26 (×8): 300 mg via ORAL
  Filled 2012-02-19: qty 2
  Filled 2012-02-19: qty 42
  Filled 2012-02-19 (×3): qty 2
  Filled 2012-02-19: qty 42
  Filled 2012-02-19 (×5): qty 2

## 2012-02-19 MED ORDER — ADULT MULTIVITAMIN W/MINERALS CH
1.0000 | ORAL_TABLET | Freq: Every day | ORAL | Status: DC
  Administered 2012-02-19 – 2012-02-27 (×11): 1 via ORAL
  Filled 2012-02-19 (×11): qty 1

## 2012-02-19 MED ORDER — HYDROXYZINE HCL 25 MG PO TABS: 25.0000 mg | ORAL_TABLET | Freq: Four times a day (QID) | ORAL | Status: AC | PRN

## 2012-02-19 MED ORDER — ALUM & MAG HYDROXIDE-SIMETH 200-200-20 MG/5ML PO SUSP: 30.0000 mL | ORAL | Status: DC | PRN

## 2012-02-19 NOTE — Progress Notes (Signed)
Psychoeducational Group Note  Date:  02/19/2012 Time:  0930  Group Topic/Focus:  Rediscovering Joy:   The focus of this group is to explore various ways to relieve stress in a positive manner.  Participation Level:  Minimal  Participation Quality:  Appropriate  Affect:  Appropriate  Cognitive:  Appropriate  Insight:  Limited  Engagement in Group:  Limited  Additional Comments:  Pt had to leave group early due to the fact pt had to meet with the MD.  Evlyn Clines, Kasi Lasky E 02/19/2012, 11:44 AM

## 2012-02-19 NOTE — Progress Notes (Signed)
[facility] Group Notes:  (Counselor/Nursing/MHT/Case Management/Adjunct)  02/19/2012 12:20 PM  Type of Therapy:  Group Therapy  Participation Level:  Active  Participation Quality:  Attentive and Sharing  Affect:  Depressed  Cognitive:  Oriented  Insight:  Good  Engagement in Group:  Good  Engagement in Therapy:  Good  Modes of Intervention:  Education, Problem-solving, Support and Exploration  Summary of Progress/Problems: Patient was active in discussion of suicide prevention. She stated that when her life gets to going so bad she  feels like she wants to end her life. Counselor discussed the difference of not liking her life and wanting to end life. Discussed coping skills of finding hope and things to live for. Patient identified how her boyfriend is a negative influence and doesn't support her. Stated that she stays in the relationship because of finances. Talked to her about finding her emotional support from outside resources (i.e. Support groups,). Patient also discussed feeling like when she did reach out for help, that Vesta Mixer stated she did not meet criteria and didn't seem to care.    Darey Hershberger, [name] 02/19/2012, 12:20 PM

## 2012-02-19 NOTE — Progress Notes (Signed)
Patient ID: Maureen Swanson, female   DOB: 23-Feb-1966, 46 y.o.   MRN: [id number] Pt was pleasant and cooperative, but flat and sedated during the adm process. Pt would began snoring between questions, but was able to answer questions appropriately. However, pt was guarded and forwarded little information. Pt acknowledged understanding of treatment agreement and voiced no questions or concerns. Encouragement and support was offered.

## 2012-02-19 NOTE — Progress Notes (Signed)
Psychoeducational Group Note  Date:  02/19/2012 Time:  2000  Group Topic/Focus:  karaoke  Participation Level:  Active  Participation Quality:  Appropriate  Affect:  Appropriate  Cognitive:  Alert  Insight:  Good  Engagement in Group:  Good  Additional Comments:    Dreden Rivere R 02/19/2012, 10:11 PM

## 2012-02-19 NOTE — Tx Team (Signed)
Initial Interdisciplinary Treatment Plan  PATIENT STRENGTHS: (choose at least two) Ability for insight Average or above average intelligence Capable of independent living Communication skills General fund of knowledge Motivation for treatment/growth Physical Health  PATIENT STRESSORS: Financial difficulties Marital or family conflict Medication change or noncompliance Substance abuse   PROBLEM LIST: Problem List/Patient Goals Date to be addressed Date deferred Reason deferred Estimated date of resolution  "Want to be able to control depression"  02/19/12     "To take my medicine" supposed to  02/19/12           "To stop drinking alcohol" 02/19/12     depression 02/19/12     Substance abuse 02/19/12     Increased risk for suicide 02/19/12                  DISCHARGE CRITERIA:  Ability to meet basic life and health needs Adequate post-discharge living arrangements Improved stabilization in mood, thinking, and/or behavior Medical problems require only outpatient monitoring Motivation to continue treatment in a less acute level of care Need for constant or close observation no longer present Reduction of life-threatening or endangering symptoms to within safe limits Safe-care adequate arrangements made Verbal commitment to aftercare and medication compliance Withdrawal symptoms are absent or subacute and managed without 24-hour nursing intervention  PRELIMINARY DISCHARGE PLAN: Attend 12-step recovery group Outpatient therapy Participate in family therapy Return to previous living arrangement  PATIENT/FAMIILY INVOLVEMENT: This treatment plan has been presented to and reviewed with the patient, Maureen Swanson, and/or family member.  The patient and family have been given the opportunity to ask questions and make suggestions.  Fransico Michael Laverne 02/19/2012, 12:44 AM
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
## 2012-02-19 NOTE — Progress Notes (Signed)
Pt laying in bed resting with eyes closed. Respirations even and unlabored. No distress noted.  
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
## 2012-02-19 NOTE — Progress Notes (Signed)
Brief Nutrition Note  Reason: Nutrition Risk for unintentional weight loss  Patient reported her appetite has been fair to poor. She reported she has been eating bites at meals.She reported PTA she was not eating because she did not feel hungry.   Wt Readings from Last 10 Encounters:  02/19/12 174 lb (78.926 kg)  09/25/11 180 lb (81.647 kg)  06/21/11 184 lb (83.462 kg)  10/17/10 205 lb (92.987 kg)  07/03/10 204 lb (92.534 kg)  06/21/10 202 lb 7 oz (91.825 kg)  03/18/10 192 lb (87.091 kg)  03/01/10 189 lb (85.73 kg)  10/29/09 191 lb (86.637 kg)  08/20/09 187 lb 9.6 oz (85.095 kg)  *Weight loss of 6 lb over 5 months,  3.3% from baseline.   UBW: 194 lb per pt.  %UBW: 89.6% BMI: 30.8 kg/m^2 (Obesioty class 1)  I have encouraged the patient to increase PO intake. I have educated the patient on good food choices and protein containing foods. The patient agrees to try resource breeze nutrition supplement. The patient was without any nutrition related questions and verbalized understanding of the nutrition information provided.   Will order patient Resource Breeze BID, provides 500 kcal and 18 grams of protein daily to increase PO intake.   RD available for nutrition needs.   Iven Finn [facility] [phone]

## 2012-02-19 NOTE — Tx Team (Signed)
02/19/2012  Interdisciplinary Treatment Plan Update (Adult) Date: 02/19/2012  Time Reviewed: 12:58 PM  Progress in Treatment: Attending groups: Yes Participating in groups: Yes Taking medication as prescribed: Yes Tolerating medication: Yes Family/Significant othe contact made: Counselor to assess for appropriate referrals. Patient understands diagnosis: Yes Discussing patient identified problems/goals with staff: Yes Medical problems stabilized or resolved: Yes Denies suicidal/homicidal ideation: Yes Issues/concerns per patient self-inventory: None identified Other: N/A New problem(s) identified: None Identified Reason for Continuation of Hospitalization: Hallucinations Homicidal ideation Medication stabilization Suicidal ideation Interventions implemented related to continuation of hospitalization: mood stabilization, medication monitoring and adjustment, group therapy and psycho education, safety checks q 15 mins Additional comments: N/A Estimated length of stay:  Discharge Plan: SW is assessing for appropriate referrals.  New goal(s): N/A Review of initial/current patient goals per problem list:  1. Goal(s): Reduce depressive symptoms  Met: No  Target date: by discharge  As evidenced by: Reducing depression from a 10 to a 3 as reported by pt.  2. Goal (s): Eliminate Suicidal Ideation  Met: No  Target date: by discharge  As evidenced by: Eliminate suicidal ideation.  3. Goal(s): Reduce Psychosis  Met: No  Target date: by discharge  As evidenced by: Reduce psychotic symptoms to baseline, as reported by pt.  4. Goal(s):  Address Stabilization of Medications  Met: No  Target date: by discharge  As evidenced by:  Self report and physician confirmation. Attendees: Patient: Maureen Swanson  02/19/2012 12:58 PM  Family:    Physician: Franchot Gallo, MD 02/19/2012 12:58 PM   Nursing:    Case Manager: Clarice Pole, LCASA 02/19/2012 12:58 PM   Counselor: Veto Kemps, MT-BC 02/19/2012 12:58 PM   Other: Abbie Sons, RN 02/19/2012 12:58 PM   Other:    Other:    Other:    Scribe for Treatment Team:  Clarice Pole, LCASA 02/19/2012 12:58 PM

## 2012-02-19 NOTE — Progress Notes (Signed)
D:  Pt about on the unit today.  She presents with a flat affect/depressed mood.  She denies SI/HI   She does admit to seeing shadows and "seeing people" prior to admission.  She reports prior use of crack /cocaine and alcohol.  She has complained today of being tired/sleepy.  She has tried to attend groups but did go to lie down after lunch.  She reports she was having HI towards boyfriend prior to admission.  She said they have gotten into arguments before mostly over drugs.  A:  Offered support and encouragement.  Given meds as prescribed.  R;  Pt receptive to staff.  Remains on q 15 minute checks for safety.  Will continue to monitor.

## 2012-02-19 NOTE — Discharge Planning (Signed)
02/19/2012  Pt did not attend d/c planning group on this date. SW met with pt individually at this time.   Pt reported she is currrently patient at [facility].  SW contacted to schedule follow-up appts for therapy and medication management.  SW to continue to monitor and assess.  Clarice Pole, LCASA 02/19/2012, 3:09 PM

## 2012-02-20 DIAGNOSIS — F1410 Cocaine abuse, uncomplicated: Secondary | ICD-10-CM

## 2012-02-20 DIAGNOSIS — F259 Schizoaffective disorder, unspecified: Principal | ICD-10-CM

## 2012-02-20 DIAGNOSIS — F1010 Alcohol abuse, uncomplicated: Secondary | ICD-10-CM

## 2012-02-20 LAB — GLUCOSE, CAPILLARY
Glucose-Capillary: 101 mg/dL — ABNORMAL HIGH (ref 70–99)
Glucose-Capillary: 104 mg/dL — ABNORMAL HIGH (ref 70–99)
Glucose-Capillary: 141 mg/dL — ABNORMAL HIGH (ref 70–99)

## 2012-02-20 LAB — HEMOGLOBIN A1C
Hgb A1c MFr Bld: 5.3 % (ref <5.7)
Mean Plasma Glucose: 105 mg/dL (ref <117)
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
## 2012-02-20 NOTE — Progress Notes (Signed)
Psychoeducational Group Note  Date:  02/20/2012 Time:  2000  Group Topic/Focus:  Wrap-Up Group:   The focus of this group is to help patients review their daily goal of treatment and discuss progress on daily workbooks.  Participation Level: Did Not Attend  Participation Quality:  Not Applicable  Affect:  Not Applicable  Cognitive:  Not Applicable  Insight:  Not Applicable  Engagement in Group: Not Applicable  Additional Comments:  Pt. Did not attend  Sondra Come 02/20/2012, 10:03 PM

## 2012-02-20 NOTE — Progress Notes (Addendum)
Pt. Appears very cooperative but very lethargic stating the meds are making her very sleepy. Pt has good eye contact and is very pleasant. She has been attending groups and does contract for safety. Pt did ask the nurse if there was a t-shirt she could have from the clothing closet. Nurse did find her a couple of shirts and pt was very appreciative. Pt early c/o she had some diarrhea and was given one immodium for this. Stated she felt better. Pt unsure where she will go from here but states she feels very safe being here. Denies Si or Hi and contracts for safety. Spoke with pt at length about different options. 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 Stated since being with this BF of 8 years she feels financially dependent on him. Pt states she could possilby live with her sister who owns a three bedroom house on [address]. Pt did comment that the sister is a Fish farm manager witness. Pt was very receptive to talking and very appreciative that someone would listen.

## 2012-02-20 NOTE — Progress Notes (Signed)
[facility] MD Progress Note                                         02/20/2012    Maureen Swanson [birth year]    [id number] Hospital day #2  1. Schizoaffective disorder, depressive type   2. Alcohol abuse   3. Cocaine abuse, continuous use    The patient was seen today and reports the following:  Sleep: good Appetite: same  Mild (1-10) Severe  Depression (1-10):  9 Anxiety (1-10): 8 Hopelessness (1-10):8   Suicidal Ideation: The patient denies suicidal ideation. Plan: None Intent: None Means: None  Homicidal Ideation: The patient denies homicidal ideation. Plan: None Intent: None Means: None  Eye Contact:  fair General Appearance: casual fairly groomed Behavior:  Cooperative  Motor Behavior: normal Speech: normal  Mental Status:  Orientation x  2 Level of Consciousness:   alert Mood: depressed Affect:congruent   Thought Process:  disorganized Thought Content: still has auditory hallucinations Perception: impaired  Judgment: fair Insight:present Cognition: at least average  VS: height is 5' 3.5" (1.613 m) and weight is 78.926 kg (174 lb). Her oral temperature is 98.7 F (37.1 C). Her blood pressure is 137/87 and her pulse is 84. Her respiration is 16.  Current Medication:  . atenolol  50 mg Oral Daily  . buPROPion  150 mg Oral BH-q8a2p  . chlordiazePOXIDE  25 mg Oral QID   Followed by  . chlordiazePOXIDE  25 mg Oral TID   Followed by  . chlordiazePOXIDE  25 mg Oral BH-qamhs   Followed by  . chlordiazePOXIDE  25 mg Oral Daily  . chlordiazePOXIDE  50 mg Oral Once  . citalopram  20 mg Oral Daily  . feeding supplement  1 Container Oral BID BM  . Fluticasone-Salmeterol  1 puff Inhalation Q12H  . hydrochlorothiazide  25 mg Oral Daily  . multivitamin with minerals  1 tablet Oral Daily  . naproxen  250 mg Oral BID WC  . nicotine  21 mg Transdermal Q0600  . pantoprazole  40 mg Oral QHS  . risperiDONE  3 mg Oral QHS  . thiamine  100 mg Intramuscular Once  .  thiamine  100 mg Oral Daily  . traZODone  300 mg Oral QHS    Lab results:  Results for orders placed during the hospital encounter of 02/18/12 (from the past 48 hour(s))  TSH     Status: Normal   Collection Time   02/19/12  6:15 AM      Component Value Range Comment   TSH 0.794  0.350 - 4.500 uIU/mL   T4, FREE     Status: Normal   Collection Time   02/19/12  6:15 AM      Component Value Range Comment   Free T4 1.10  0.80 - 1.80 ng/dL   GLUCOSE, CAPILLARY     Status: Abnormal   Collection Time   02/19/12  5:07 PM      Component Value Range Comment   Glucose-Capillary 100 (*) 70 - 99 mg/dL    Comment 1 Notify RN     HEMOGLOBIN A1C     Status: Normal   Collection Time   02/19/12  7:45 PM      Component Value Range Comment   Hemoglobin A1C 5.3  <5.7 %    Mean Plasma Glucose 105  <117 mg/dL   GLUCOSE, CAPILLARY  Status: Normal   Collection Time   02/19/12  9:50 PM      Component Value Range Comment   Glucose-Capillary 86  70 - 99 mg/dL    Comment 1 Notify RN     GLUCOSE, CAPILLARY     Status: Abnormal   Collection Time   02/20/12  6:16 AM      Component Value Range Comment   Glucose-Capillary 101 (*) 70 - 99 mg/dL   GLUCOSE, CAPILLARY     Status: Abnormal   Collection Time   02/20/12 12:09 PM      Component Value Range Comment   Glucose-Capillary 103 (*) 70 - 99 mg/dL   GLUCOSE, CAPILLARY     Status: Abnormal   Collection Time   02/20/12  4:54 PM      Component Value Range Comment   Glucose-Capillary 104 (*) 70 - 99 mg/dL   Labs reviewed. Group attendance:   ROS:    Constitutional: WDWN Adult in NAD   GI: Negative for N,V,D,C   Neuro: Negative for dizziness, blurred vision, visual changes, headaches   Resp: Negative for wheezing, SOB, cough   Cardio: Negative for CP, diaphoresis, fatigue   MSK: Negative for joint pain, swelling, DROM, or ambulatory difficulties.  Time was spent with the patient discussing the current symptoms and the response to treatment. Mellody is  considering asking her sister if she could stay with her after she attends rehab.  She is reporting no symptoms of withdrawal, or side effects to her medication.  Treatment Summary: 1. Admit for crisis management and stabilization. 2. Medication management to reduce current symptoms to base line and improve the     patient's overall level of functioning 3. Treat health problems as indicated. 4. Develop treatment plan to decrease risk of relapse upon discharge and the need for     readmission. 5. Psycho-social education regarding relapse prevention and self care. 6. Health care follow up as needed for medical problems. 7. Restart home medications where appropriate.    Plan:  1. Will continue the patient on the medication Celexa at 20 mgs po q am for depression and anxiety.  2. Will continue the medication Risperdal at 3 mgs po qhs for sleep and psychosis.  3. Will continue the patient on a Librium Detox Protocol for detox from her use of alcohol.  4. Will continue the patient on the medication Trazodone at 300 mgs po qhs for sleep.  5. Will continue a MVI po q am for nutritional supplementation.  6. Will continue Thiamine 100 mgs po q am for nutritional supplementation.  7. Will restart the patient on her non-psychiatric medications as listed above.  8. Laboratory studies reviewed.  9. Will continue to monitor.  Rona Ravens. [name] [facility] 02/20/2012 9:07 PM         Treatment Plan:       Lloyd Huger T. Shae Hinnenkamp [facility] 02/20/2012

## 2012-02-20 NOTE — Progress Notes (Signed)
D   Pt is depressed and sad   She did attend group tonight and said she enjoyed it   She denies voices at present and denies suicidal and homicidal ideation    A  Verbal support given  Medications administered and effectiveness monitored  Q 15 min checks R   Pt safe at present
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
## 2012-02-20 NOTE — Progress Notes (Signed)
[facility] Group Notes:  (Counselor/Nursing/MHT/Case Management/Adjunct)  02/20/2012 2:13 PM  Type of Therapy:  Group Therapy  Participation Level:  Active  Participation Quality:  Attentive and Sharing  Affect:  Depressed  Cognitive:  Oriented  Insight:  Good  Engagement in Group:  Good  Engagement in Therapy:  Good  Modes of Intervention:  Clarification, Education, Problem-solving and Support  Summary of Progress/Problems: Patient was active in discussion of relapse prevention. She talked about being disappointed in her sponsor and that she never seemed available or to care. Discussed having several plans for support to deal with stressors at home. She plans to talk to her aunts. In the past this has been a problem that she wouldn't talk to them because she was drinking or high. Plans to ask for help sooner before she uses. Talked about not taking her anger inward and causing more depression. Also talked about asking herself if what she wanted right then actually accomplished her bigger goal of recovery.   Eveny Anastas, [name] 02/20/2012, 2:13 PM
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
## 2012-02-21 DIAGNOSIS — F1410 Cocaine abuse, uncomplicated: Secondary | ICD-10-CM

## 2012-02-21 LAB — GLUCOSE, CAPILLARY
Glucose-Capillary: 111 mg/dL — ABNORMAL HIGH (ref 70–99)
Glucose-Capillary: 118 mg/dL — ABNORMAL HIGH (ref 70–99)

## 2012-02-21 NOTE — Progress Notes (Signed)
Patient ID: Maureen Swanson, female   DOB: 1965-06-21, 46 y.o.   MRN: [id number]   D: Patient lying in bed with eyes closed. Appears to be sleeping. Respirations even and non-labored. A: Staff will monitor on q 15 minute checks and follow treatments and meds as ordered R: No response from patient at present due to sleeping.

## 2012-02-21 NOTE — Progress Notes (Signed)
[facility] Group Notes:  (Counselor/Nursing/MHT/Case Management/Adjunct)  02/21/2012 12:13 PM  Type of Therapy:  Discharge Planning/ Group Therapy  Participation Level:  Minimal  Participation Quality:  Attentive  Affect:  Blunted  Cognitive:  Alert  Insight:  Poor  Engagement in Group:  Limited  Engagement in Therapy:  Limited  Modes of Intervention:  Clarification, Education, Limit-setting, Orientation, Activity, Problem-solving, Socialization and Support  Summary of Progress/Problems:Summary of Progress/Problems: Therapist prompted Pt to report their level of anxiety and depression.  Pt denied being depressed or anxious.  Pt disclosed that his discharge plans were possibly to stay with a relative.  The focus of group therapy was Self-Sabotage.  Therapist asked Pt to identify and give examples of unhealthy or negative behaviors or thoughts.  Pt explained that she starts arguments and gets in peoples faces.  Pt stated positive activities include bike riding, coloring, and watching TV. Therapist prompted Pt to identify a positive asset.  Therapist emphasized the importance of engaging in positive activities in order to maintain a balanced lifestyle. Therapist prompted patients to join in the Affirmations Activity.  Pt gave and received positive affirmations.        Christen Butter 02/21/2012, 12:13 PM

## 2012-02-21 NOTE — Progress Notes (Signed)
Pt in dayroom and around milieu tonight. Affect remains flat, depressed with congruent mood. She is denying any current withdrawal and CIWA is a "0". She is currently denying SI/HI/AVH and is able to contract for safety should that status change. Supported, encouraged. Medicated per orders without difficulty. No prn's requested or needed. Pt remains safe at present. Maureen Swanson

## 2012-02-21 NOTE — Progress Notes (Signed)
Patient ID: Maureen Swanson, female   DOB: Jun 08, 1966, 46 y.o.   MRN: [id number]  [facility] MD Progress Note                                         02/21/2012    ANNITA RATLIFF 05/02/1966    [id number] Hospital day #3  1. Schizoaffective disorder, depressive type   2. Alcohol abuse   3. Cocaine abuse, continuous use    The patient was seen today and reports the following:  Sleep: good Appetite: same  Mild (1-10) Severe  Depression (1-10):  8 Anxiety (1-10): 8 Hopelessness (1-10):7  Suicidal Ideation: The patient denies suicidal ideation. Plan: None Intent: None Means: None  Homicidal Ideation: The patient denies homicidal ideation. Plan: None Intent: None Means: None  Eye Contact:  fair General Appearance: casual fairly groomed Behavior:  Cooperative  Motor Behavior: normal Speech: normal  Mental Status:  Orientation x  2 Level of Consciousness:   alert Mood: depressed Affect:congruent   Thought Process:  disorganized Thought Content: still has auditory hallucinations Perception: impaired  Judgment: fair Insight:present Cognition: at least average  VS: height is 5' 3.5" (1.613 m) and weight is 78.926 kg (174 lb). Her oral temperature is 98.7 F (37.1 C). Her blood pressure is 137/87 and her pulse is 84. Her respiration is 16.  Current Medication:  . atenolol  50 mg Oral Daily  . buPROPion  150 mg Oral BH-q8a2p  . chlordiazePOXIDE  25 mg Oral QID   Followed by  . chlordiazePOXIDE  25 mg Oral TID   Followed by  . chlordiazePOXIDE  25 mg Oral BH-qamhs   Followed by  . chlordiazePOXIDE  25 mg Oral Daily  . chlordiazePOXIDE  50 mg Oral Once  . citalopram  20 mg Oral Daily  . feeding supplement  1 Container Oral BID BM  . Fluticasone-Salmeterol  1 puff Inhalation Q12H  . hydrochlorothiazide  25 mg Oral Daily  . multivitamin with minerals  1 tablet Oral Daily  . naproxen  250 mg Oral BID WC  . nicotine  21 mg Transdermal Q0600  . pantoprazole  40 mg Oral QHS  .  risperiDONE  3 mg Oral QHS  . thiamine  100 mg Intramuscular Once  . thiamine  100 mg Oral Daily  . traZODone  300 mg Oral QHS    Lab results:  Results for orders placed during the hospital encounter of 02/18/12 (from the past 48 hour(s))  TSH     Status: Normal   Collection Time   02/19/12  6:15 AM      Component Value Range Comment   TSH 0.794  0.350 - 4.500 uIU/mL   T4, FREE     Status: Normal   Collection Time   02/19/12  6:15 AM      Component Value Range Comment   Free T4 1.10  0.80 - 1.80 ng/dL   GLUCOSE, CAPILLARY     Status: Abnormal   Collection Time   02/19/12  5:07 PM      Component Value Range Comment   Glucose-Capillary 100 (*) 70 - 99 mg/dL    Comment 1 Notify RN     HEMOGLOBIN A1C     Status: Normal   Collection Time   02/19/12  7:45 PM      Component Value Range Comment   Hemoglobin A1C 5.3  <5.7 %  Mean Plasma Glucose 105  <117 mg/dL   GLUCOSE, CAPILLARY     Status: Normal   Collection Time   02/19/12  9:50 PM      Component Value Range Comment   Glucose-Capillary 86  70 - 99 mg/dL    Comment 1 Notify RN     GLUCOSE, CAPILLARY     Status: Abnormal   Collection Time   02/20/12  6:16 AM      Component Value Range Comment   Glucose-Capillary 101 (*) 70 - 99 mg/dL   GLUCOSE, CAPILLARY     Status: Abnormal   Collection Time   02/20/12 12:09 PM      Component Value Range Comment   Glucose-Capillary 103 (*) 70 - 99 mg/dL   GLUCOSE, CAPILLARY     Status: Abnormal   Collection Time   02/20/12  4:54 PM      Component Value Range Comment   Glucose-Capillary 104 (*) 70 - 99 mg/dL   Labs reviewed. Group attendance:   ROS:    Constitutional: WDWN Adult in NAD   GI: Negative for N,V,D,C   Neuro: Negative for dizziness, blurred vision, visual changes, headaches   Resp: Negative for wheezing, SOB, cough   Cardio: Negative for CP, diaphoresis, fatigue   MSK: Negative for joint pain, swelling, DROM, or ambulatory difficulties.  Time was spent with the patient discussing  the current symptoms and the response to treatment.  Reports unstable gait for long time. Per pt she falls a lot at home. Feels dizzi at time. Thinks may be meds causing this.     Plan:   Continue to observe for vitals and will consider neuro consult if needed   02/21/2012

## 2012-02-21 NOTE — Progress Notes (Signed)
D   Pt is calm and cooperative  She said her withdrawal symptoms are mostly shakiness and is is improving daily   She reports sleeping real good and has a fair appetite  She said her mood has improved and reports no hallucinations   Her thinking is logical and her speech is coherent   She tends to isolate and said she feels lethargic sometimes  A   Verbal support given   Medications administered and effectiveness monitored   Continue to monitor for signs and symptoms of withdrawal   Q 15 min checks R   Pt safe at present

## 2012-02-22 LAB — GLUCOSE, CAPILLARY: Glucose-Capillary: 126 mg/dL — ABNORMAL HIGH (ref 70–99)

## 2012-02-22 NOTE — Progress Notes (Addendum)
Pt quite flat, depressed in affect with congruent mood. Reports feeling low today. Continues to endorse AH "like always." Denies any VH. Pt supported, encouraged. Explained med schedule to patient as she is wanting to retire early. Pt verbalized understanding and is agreeable to wait. Denies SI/HI/pain or problems and remains safe. When denying SI pt unable to make eye contact but does verbally contract with eye contact for safety on unit.Maureen Swanson

## 2012-02-22 NOTE — Progress Notes (Signed)
Patient ID: Maureen Swanson, female   DOB: 10-04-1965, 46 y.o.   MRN: [id number] Patient ID: Maureen Swanson, female   DOB: 1965/08/31, 46 y.o.   MRN: [id number]  [facility] MD Progress Note                                         02/22/2012    [name] [birth year]    [id number] Hospital day #4  1. Schizoaffective disorder, depressive type   2. Alcohol abuse   3. Cocaine abuse, continuous use    The patient was seen today and reports the following:  Sleep: good Appetite: same  Mild (1-10) Severe  Depression (1-10):  9 Anxiety (1-10): 8 Hopelessness (1-10):7  Suicidal Ideation: The patient reports suicidal ideation. Plan: None Intent: None Means: None  Homicidal Ideation: The patient denies homicidal ideation. Plan: None Intent: None Means: None  Eye Contact:  fair General Appearance: casual fairly groomed Behavior:  Cooperative  Motor Behavior: normal Speech: normal  Mental Status:  Orientation x  2 Level of Consciousness:   alert Mood: depressed Affect:congruent   Thought Process:  disorganized Thought Content: still has auditory hallucinations Perception: impaired  Judgment: fair Insight:present Cognition: at least average  VS: height is 5' 3.5" (1.613 m) and weight is 78.926 kg (174 lb). Her oral temperature is 98.7 F (37.1 C). Her blood pressure is 137/87 and her pulse is 84. Her respiration is 16.  Current Medication:  . atenolol  50 mg Oral Daily  . buPROPion  150 mg Oral BH-q8a2p  . chlordiazePOXIDE  25 mg Oral QID   Followed by  . chlordiazePOXIDE  25 mg Oral TID   Followed by  . chlordiazePOXIDE  25 mg Oral BH-qamhs   Followed by  . chlordiazePOXIDE  25 mg Oral Daily  . chlordiazePOXIDE  50 mg Oral Once  . citalopram  20 mg Oral Daily  . feeding supplement  1 Container Oral BID BM  . Fluticasone-Salmeterol  1 puff Inhalation Q12H  . hydrochlorothiazide  25 mg Oral Daily  . multivitamin with minerals  1 tablet Oral Daily  . naproxen  250 mg Oral BID  WC  . nicotine  21 mg Transdermal Q0600  . pantoprazole  40 mg Oral QHS  . risperiDONE  3 mg Oral QHS  . thiamine  100 mg Intramuscular Once  . thiamine  100 mg Oral Daily  . traZODone  300 mg Oral QHS    Lab results:  Results for orders placed during the hospital encounter of 02/18/12 (from the past 48 hour(s))  TSH     Status: Normal   Collection Time   02/19/12  6:15 AM      Component Value Range Comment   TSH 0.794  0.350 - 4.500 uIU/mL   T4, FREE     Status: Normal   Collection Time   02/19/12  6:15 AM      Component Value Range Comment   Free T4 1.10  0.80 - 1.80 ng/dL   GLUCOSE, CAPILLARY     Status: Abnormal   Collection Time   02/19/12  5:07 PM      Component Value Range Comment   Glucose-Capillary 100 (*) 70 - 99 mg/dL    Comment 1 Notify RN     HEMOGLOBIN A1C     Status: Normal   Collection Time   02/19/12  7:45 PM  Component Value Range Comment   Hemoglobin A1C 5.3  <5.7 %    Mean Plasma Glucose 105  <117 mg/dL   GLUCOSE, CAPILLARY     Status: Normal   Collection Time   02/19/12  9:50 PM      Component Value Range Comment   Glucose-Capillary 86  70 - 99 mg/dL    Comment 1 Notify RN     GLUCOSE, CAPILLARY     Status: Abnormal   Collection Time   02/20/12  6:16 AM      Component Value Range Comment   Glucose-Capillary 101 (*) 70 - 99 mg/dL   GLUCOSE, CAPILLARY     Status: Abnormal   Collection Time   02/20/12 12:09 PM      Component Value Range Comment   Glucose-Capillary 103 (*) 70 - 99 mg/dL   GLUCOSE, CAPILLARY     Status: Abnormal   Collection Time   02/20/12  4:54 PM      Component Value Range Comment   Glucose-Capillary 104 (*) 70 - 99 mg/dL   Labs reviewed. Group attendance:   ROS:    Constitutional: WDWN Adult in NAD   GI: Negative for N,V,D,C   Neuro: Negative for dizziness, blurred vision, visual changes, headaches   Resp: Negative for wheezing, SOB, cough   Cardio: Negative for CP, diaphoresis, fatigue   MSK: Negative for joint pain, swelling,  DROM, or ambulatory difficulties.  Time was spent with the patient discussing the current symptoms and the response to treatment.  Reports stable gait today but hoarse voice today. Thinks she is feeling down today and has SI on and off.     Plan:   Continue to observe for vitals and will consider neuro consult if needed   02/22/2012

## 2012-02-22 NOTE — Progress Notes (Signed)
Patient ID: Maureen Swanson, female   DOB: November 19, 1965, 46 y.o.   MRN: [id number]    [facility] Group Notes:  (Counselor/Nursing/MHT/Case Management/Adjunct)  02/22/2012 11 AM  Type of Therapy:  Aftercare Planning, Group Therapy, Dance/Movement Therapy   Participation Level:  Active  Participation Quality:  Appropriate  Affect:  Appropriate  Cognitive:  Appropriate  Insight:  Good  Engagement in Group:  Good  Engagement in Therapy:  Good  Modes of Intervention:  Clarification, Problem-solving, Role-play, Socialization and Support  Summary of Progress/Problems: After Care: Pt. attended and participated in aftercare planning group. Pt. accepted information on suicide prevention, warning signs to look for with suicide and crisis line numbers to use. The pt. agreed to call crisis line numbers if having warning signs or having thoughts of suicide. Pt shared that she is interested in long-term treatment.  Counseling:  Therapist and group members discussed positive perception as well as things within our control and things outside of our control. Therapist provided examples and group members discussed our options and choices when dealing with various life situations. Pt shared her experiences with depression and how she sometimes isolates herself.   Cassidi Long 02/22/2012. 11:56 AM

## 2012-02-22 NOTE — Progress Notes (Signed)
Psychoeducational Group Note  Date:  02/22/2012 Time:  2000  Group Topic/Focus:  Wrap-Up Group:   The focus of this group is to help patients review their daily goal of treatment and discuss progress on daily workbooks.  Participation Level:  Active  Participation Quality:  Appropriate  Affect:  Appropriate  Cognitive:  Appropriate  Insight:  Good  Engagement in Group:  Good  Additional Comments:  Patient attended and participated in group tonight  Scot Dock 02/22/2012, 3:46 AM

## 2012-02-22 NOTE — Progress Notes (Signed)
Psychoeducational Group Note  Date:  02/22/2012 Time:  0945 am  Group Topic/Focus:  Making Healthy Choices:   The focus of this group is to help patients identify negative/unhealthy choices they were using prior to admission and identify positive/healthier coping strategies to replace them upon discharge.  Participation Level:  Minimal  Participation Quality:  Attentive  Affect:  Flat  Cognitive:  Alert  Insight:  Limited  Engagement in Group:  Limited  Additional Comments:    Andrena Mews 02/22/2012, 10:29 AM

## 2012-02-23 LAB — GLUCOSE, CAPILLARY
Glucose-Capillary: 114 mg/dL — ABNORMAL HIGH (ref 70–99)
Glucose-Capillary: 146 mg/dL — ABNORMAL HIGH (ref 70–99)

## 2012-02-23 MED ORDER — LAMOTRIGINE 25 MG PO TABS
25.0000 mg | ORAL_TABLET | Freq: Every day | ORAL | Status: DC
  Administered 2012-02-23 – 2012-02-26 (×4): 25 mg via ORAL
  Filled 2012-02-23: qty 1
  Filled 2012-02-23: qty 14
  Filled 2012-02-23 (×4): qty 1
  Filled 2012-02-23: qty 14
  Filled 2012-02-23: qty 1

## 2012-02-23 MED ORDER — CITALOPRAM HYDROBROMIDE 40 MG PO TABS
40.0000 mg | ORAL_TABLET | Freq: Every day | ORAL | Status: DC
  Administered 2012-02-24 – 2012-02-27 (×4): 40 mg via ORAL
  Filled 2012-02-23 (×2): qty 14
  Filled 2012-02-23 (×6): qty 1

## 2012-02-23 MED ORDER — RISPERIDONE 2 MG PO TABS
4.0000 mg | ORAL_TABLET | Freq: Every day | ORAL | Status: DC
  Administered 2012-02-23 – 2012-02-26 (×4): 4 mg via ORAL
  Filled 2012-02-23 (×5): qty 2
  Filled 2012-02-23: qty 28
  Filled 2012-02-23: qty 2
  Filled 2012-02-23: qty 28
  Filled 2012-02-23: qty 2

## 2012-02-23 NOTE — Discharge Planning (Signed)
02/23/2012  Pt did not attend d/c planning group on this date. SW met with pt individually at this time.   Pt informed she will not be able to go to [facility], but SW to start referral to [facility].  SW to continue to monitor and assess.  Clarice Pole, LCASA 02/23/2012, 10:14 AM

## 2012-02-23 NOTE — Progress Notes (Signed)
02/23/2012         Time: 0930      Group Topic/Focus: The focus of this group is on enhancing patients' problem solving skills, which involves identifying the problem, brainstorming solutions and choosing and trying a solution.  Participation Level: Did not attend  Participation Quality: Not Applicable  Affect: Not Applicable  Cognitive: Not Applicable   Additional Comments: Patient was asleep, couldn't be awoken.    Daneli Butkiewicz 02/23/2012 10:03 AM

## 2012-02-23 NOTE — Progress Notes (Signed)
Psychoeducational Group Note  Date:  02/23/2012 Time:  2000  Group Topic/Focus:  Wrap-Up Group:   The focus of this group is to help patients review their daily goal of treatment and discuss progress on daily workbooks.  Participation Level:  Active  Participation Quality:  Appropriate  Affect:  Appropriate  Cognitive:  Appropriate  Insight:  Good  Engagement in Group:  Good  Additional Comments:  Patient attended and participated in group tonight. She reports that she had a good day. She went outside, attended groups, talk with her sister whom encouraged her to take her medication. She also talked to her boyfriend. Patient advised that her support system was her therapist and her case worker whom she see three times per month. She also can talk to her uncle.  Maureen Swanson [facility] 02/23/2012, 12:52 AM

## 2012-02-23 NOTE — Progress Notes (Signed)
[facility] MD Progress Note  02/23/2012 3:26 PM  Current Mental Status Per Physician:   Diagnosis:  Axis I:  Schizoaffective Disorder - Depressed Type.   Cocaine Abuse.   Alcohol Abuse.  Axis II: Deferred.  Axis III:  1. Hypertension.    2. Asthma.    3. Osteoarthritis.    4. NIDDM.    5. Back Pain.  Axis IV: Chronic Mental Illness. Long Standing Substance Abuse Issues. Limited Primary Support. Non-compliance with medications.  Axis V: GAF at time of admission approximately 35.   The patient was seen today and reports the following:   ADL's: Intact.  Sleep: The patient reports to feeling oversedated this morning with her current medications.  However, the patient received her last Librium dosage this morning for her alcohol detox. Appetite: The patient reports a decreased appetite today.   Mild>(1-10) >Severe  Hopelessness (1-10): 8  Depression (1-10): 8  Anxiety (1-10): 8   Suicidal Ideation: The patient denies any suicidal ideations today but states she was experiencing suicidal ideations last night.  Plan: No  Intent: No  Means: No   Homicidal Ideation: The patient denies any homicidal ideations today. Plan: No  Intent: No.  Means: No   General Appearance/Behavior: The patient was cooperative today with this provider but again appears significantly depressed.  Eye Contact: Good.  Speech: Appropriate in rate and volume with no pressuring noted today.  Motor Behavior: wnl.  Level of Consciousness: Alert and Oriented x 3.  Mental Status: Alert and Oriented x 3.  Mood: Severely Depressed.  Affect: Essentially Flat.  Anxiety Level: Severe anxiety reported today.  Thought Process: Auditory hallucinations reported.  Thought Content: The patient states that she is having ongoing auditory hallucinations but states they are much improved.  The patient denies any visual hallucinations or delusional thinking today.  Perception:. Auditory hallucinations reported.  Judgment: Fair.    Insight: Fair.  Cognition: Oriented to person, place and time.  Sleep:  Number of Hours: 6.5    Vital Signs:Blood pressure 148/82, pulse 90, temperature 98.4 F (36.9 C), temperature source Oral, resp. rate 20, height 5' 3.5" (1.613 m), weight 78.926 kg (174 lb).  Current Medications: Current Facility-Administered Medications  Medication Dose Route Frequency Provider Last Rate Last Dose  . acetaminophen (TYLENOL) tablet 650 mg  650 mg Oral Q6H PRN Kerry Hough, PA   650 mg at 02/22/12 1306  . albuterol (PROVENTIL HFA;VENTOLIN HFA) 108 (90 BASE) MCG/ACT inhaler 2 puff  2 puff Inhalation Q6H PRN Verne Spurr, PA-C      . alum & mag hydroxide-simeth (MAALOX/MYLANTA) 200-200-20 MG/5ML suspension 30 mL  30 mL Oral Q4H PRN Kerry Hough, PA      . atenolol (TENORMIN) tablet 50 mg  50 mg Oral Daily Curlene Labrum Ariea Rochin, MD   50 mg at 02/23/12 0940  . buPROPion [facility] SR) 12 hr tablet 150 mg  150 mg Oral BH-q8a2p Curlene Labrum Correll Denbow, MD   150 mg at 02/23/12 1400  . chlordiazePOXIDE (LIBRIUM) capsule 25 mg  25 mg Oral Daily Curlene Labrum Baylor Cortez, MD   25 mg at 02/23/12 0941  . chlordiazePOXIDE (LIBRIUM) capsule 50 mg  50 mg Oral Once Kerry Hough, PA      . citalopram (CELEXA) tablet 40 mg  40 mg Oral Daily Curlene Labrum Lianna Sitzmann, MD      . feeding supplement (RESOURCE BREEZE) liquid 1 Container  1 Container Oral BID BM Ronny Bacon, MD   1 Container at 02/23/12  1401  . Fluticasone-Salmeterol (ADVAIR) 250-50 MCG/DOSE inhaler 1 puff  1 puff Inhalation Q12H Curlene Labrum Billyjack Trompeter, MD   1 puff at 02/23/12 0944  . hydrochlorothiazide (HYDRODIURIL) tablet 25 mg  25 mg Oral Daily Curlene Labrum Tedra Coppernoll, MD   25 mg at 02/23/12 0940  . magnesium hydroxide (MILK OF MAGNESIA) suspension 30 mL  30 mL Oral Daily PRN Kerry Hough, PA      . multivitamin with minerals tablet 1 tablet  1 tablet Oral Daily Curlene Labrum Jeneane Pieczynski, MD   1 tablet at 02/23/12 0941  . naproxen (NAPROSYN) tablet 250 mg  250 mg Oral BID WC Curlene Labrum  Kaveh Kissinger, MD   250 mg at 02/23/12 0941  . nicotine (NICODERM CQ - dosed in mg/24 hours) patch 21 mg  21 mg Transdermal Q0600 Curlene Labrum Carmin Dibartolo, MD   21 mg at 02/23/12 1610  . pantoprazole (PROTONIX) EC tablet 40 mg  40 mg Oral QHS Curlene Labrum Ocean Kearley, MD   40 mg at 02/22/12 2151  . risperiDONE (RISPERDAL) tablet 3 mg  3 mg Oral QHS Curlene Labrum Columbus Ice, MD   3 mg at 02/22/12 2150  . thiamine (B-1) injection 100 mg  100 mg Intramuscular Once Kerry Hough, PA      . thiamine (VITAMIN B-1) tablet 100 mg  100 mg Oral Daily Curlene Labrum Mikeal Winstanley, MD   100 mg at 02/23/12 0940  . traZODone (DESYREL) tablet 300 mg  300 mg Oral QHS Curlene Labrum Namon Villarin, MD   300 mg at 02/22/12 2151  . DISCONTD: citalopram (CELEXA) tablet 20 mg  20 mg Oral Daily Ronny Bacon, MD   20 mg at 02/23/12 0940   Lab Results:  Results for orders placed during the hospital encounter of 02/18/12 (from the past 48 hour(s))  GLUCOSE, CAPILLARY     Status: Abnormal   Collection Time   02/21/12  5:15 PM      Component Value Range Comment   Glucose-Capillary 118 (*) 70 - 99 mg/dL    Comment 1 Notify RN     GLUCOSE, CAPILLARY     Status: Abnormal   Collection Time   02/22/12  6:07 AM      Component Value Range Comment   Glucose-Capillary 125 (*) 70 - 99 mg/dL   GLUCOSE, CAPILLARY     Status: Abnormal   Collection Time   02/22/12 11:35 AM      Component Value Range Comment   Glucose-Capillary 126 (*) 70 - 99 mg/dL    Comment 1 Notify RN     GLUCOSE, CAPILLARY     Status: Abnormal   Collection Time   02/22/12  5:07 PM      Component Value Range Comment   Glucose-Capillary 152 (*) 70 - 99 mg/dL    Comment 1 Notify RN     GLUCOSE, CAPILLARY     Status: Abnormal   Collection Time   02/23/12  6:02 AM      Component Value Range Comment   Glucose-Capillary 114 (*) 70 - 99 mg/dL   GLUCOSE, CAPILLARY     Status: Abnormal   Collection Time   02/23/12 11:39 AM      Component Value Range Comment   Glucose-Capillary 146 (*) 70 - 99 mg/dL    Physical  Findings: AIMS: Facial and Oral Movements Muscles of Facial Expression: None, normal Lips and Perioral Area: None, normal Jaw: None, normal Tongue: None, normal,Extremity Movements Upper (arms, wrists, hands, fingers): None, normal Lower (legs,  knees, ankles, toes): None, normal, Trunk Movements Neck, shoulders, hips: None, normal, Overall Severity Severity of abnormal movements (highest score from questions above): None, normal Incapacitation due to abnormal movements: None, normal Patient's awareness of abnormal movements (rate only patient's report): No Awareness, Dental Status Current problems with teeth and/or dentures?: No Does patient usually wear dentures?: No  CIWA:  CIWA-Ar Total: 3  COWS:     Review of Systems:  Neurological: The patient denies any headaches today. He denies any seizures or dizziness.  G.I.: The patient denies any constipation today or G.I. Upset.  Musculoskeletal: The patient reports chronic back pain which is currently under good control.   Time was spent today discussing with the patient her current symptoms. The patient states that she is having oversedated on her current medications but did receive her last dosage of Librium this morning.  The patient reports a poor appetite and reports ongoing severe feelings of sadness, anhedonia and depressed mood.  She denies any current suicidal or homicidal ideations today but did reports some suicidal ideations without plan or intent last night.  She denies any homicidal ideations today.  She reports ongoing auditory hallucinations which are improving and denies any visual hallucinations or delusional thinking today.  The patient also reports ongoing severe anxiety symptoms.   Treatment Plan Summary:  1. Daily contact with patient to assess and evaluate symptoms and progress in treatment.  2. Medication management  3. The patient will deny suicidal ideations or homicidal ideations for 48 hours prior to discharge and  have a depression and anxiety rating of 3 or less. The patient will also deny any auditory or visual hallucinations or delusional thinking.  4. The patient will deny any symptoms of substance withdrawal at time of discharge.   Plan:  1. Will increase the medication Celexa to 40 mgs po q am to further address her depression and anxiety.  2. Will increase the medication Risperdal to 4 mgs po qhs for sleep and to further address her psychosis.  3. Will continue the patient on the medication Trazodone at 300 mgs po qhs for sleep.  4. Will continue the patient on a MVI po q am for nutritional supplementation.  5. Will continue the patient on Thiamine 100 mgs po q am for nutritional supplementation.  6. Will continue the patient on her non-psychiatric medications as listed above.  7. Laboratory studies reviewed.  8. Will continue to monitor.   Maricela Kawahara 02/23/2012, 3:26 PM

## 2012-02-23 NOTE — Progress Notes (Signed)
Psychoeducational Group Note  Date:  02/23/2012 Time:  2000  Group Topic/Focus:  Goals Group:   The focus of this group is to help patients establish daily goals to achieve during treatment and discuss how the patient can incorporate goal setting into their daily lives to aide in recovery.  Participation Level:  Active  Participation Quality:  Appropriate  Affect:  Depressed  Cognitive:  Appropriate  Insight:  Limited  Engagement in Group:  Good  Additional Comments:  Patient stated that she had a "fair" day and expressed that she was depressed and anxious earlier. Patient was unable to identify the source, but is curious to know if it is medication related. Patient noted difficulty sleeping with a hard time waking up in the morning. Patient wants to try long term treatment. Patient's goal is to attempt to be in a better mood.   Lyndee Hensen 02/23/2012, 9:21 PM

## 2012-02-23 NOTE — Progress Notes (Signed)
D: In dayroom, watching TV with peers on approach. Appears flat and depressed. Calm and cooperative with assessment. States she has had an up/down day. States the up part was going outside and the down part was having to come back in. Does feel like she feels better but does not feel like her mood has improved much since admission. Currently Denies SI/HI/AVH and contracts for safety. Offered no questions or concerns.  A: Safety has been maintained with Q15 minute observation. Support and encouragement provided. POC and medications for the shift reviewed and understanding verbalized. Attending groups with appropriate participation. Meds given per MD order.   R: Pt remains safe. She is calm and cooperative and continues to be depressed. She offers no questions or concerns. Will continue Q15 min observation and continue current POC.

## 2012-02-23 NOTE — Progress Notes (Signed)
Patient ID: Maureen Swanson, female   DOB: 07-05-1965, 46 y.o.   MRN: [id number]   D: Patient pleasant on approach but very drowsy. Feels that sleeping medication may be too strong for her. Attempted to wake up for 8am medications earlier but couldn't keep her eyes open. Reports depression and feelings of hopelessness "8" on scales. Reports some SI on and off but no plan at present. Does still report hearing voices at times. Reports feeling nervous this am and hands shaking some. Patient was given librium with morning medications as ordered. Awaiting placement to more longer term substance abuse facility. A: Staff will monitor on q15 minute checks and encourage group attendance. RN to follow treatments and medication orders. R: Taking all medications without issue and cooperative on the unit.

## 2012-02-24 LAB — GLUCOSE, CAPILLARY

## 2012-02-24 NOTE — Progress Notes (Signed)
[facility] Group Notes:  (Counselor/Nursing/MHT/Case Management/Adjunct)  02/24/2012 5:09 PM  Type of Therapy:  Group Therapy  Participation Level:  Active  Participation Quality:  Attentive, Drowsy and Sharing  Affect:  Depressed  Cognitive:  Oriented  Insight:  Good  Engagement in Group:  Good  Engagement in Therapy:  Good  Modes of Intervention:  Clarification, Education, Problem-solving and Support  Summary of Progress/Problems: Patient stated that she has had trouble because she feels like she is too drowsy. Still feels depressed about her situation. Wants treatment. Doesn't know if she will go back with boyfriend.   Dijuan Sleeth, [name] 02/24/2012, 5:09 PM

## 2012-02-24 NOTE — Progress Notes (Signed)
Psychoeducational Group Note  Date:  02/24/2012 Time:  0930 Group Topic/Focus:  Recovery Goals:   The focus of this group is to identify appropriate goals for recovery and establish a plan to achieve them.  Participation Level:  Active  Participation Quality:  Appropriate  Affect:  Appropriate  Cognitive:  Appropriate  Insight:  Good  Engagement in Group:  Good  Additional Comments:  Pt shared positively with the group this morning.  Jodi Criscuolo E 02/24/2012, 11:04 AM

## 2012-02-24 NOTE — Progress Notes (Signed)
[facility] MD Progress Note                                         02/24/2012    Maureen Swanson 1965-11-24    [id number] Hospital day #6  1. Schizoaffective disorder, depressive type   2. Alcohol abuse   3. Cocaine abuse, continuous use    The patient was seen today and reports the following:  Sleep: fair Appetite: fair  Mild (1-10) Severe  Depression (1-10):  9 Anxiety (1-10): not good" due to not being sure about her discharge plans Hopelessness (1-10): 9   Suicidal Ideation: The patient does report some suicidal ideation last pm but notes none today. Plan: None Intent: None Means: None  Homicidal Ideation: The patient denies homicidal ideation. Plan: None Intent: None Means: None  Eye Contact:  fair General Appearance: fairly groomed Behavior:  Cooperative  Motor Behavior: normal Speech: clear and goal directed  Mental Status:  Orientation x  3 Level of Consciousness:   alert Mood: depressed Affect: congruent   Thought Process:  linear Thought Content:  + auditory hallucinations " about the same" Perception:  impaired  Judgment: poor Insight: lacking Cognition: at least average  VS: height is 5' 3.5" (1.613 m) and weight is 78.926 kg (174 lb). Her oral temperature is 98.4 F (36.9 C). Her blood pressure is 145/92 and her pulse is 94. Her respiration is 20.   Current Medication:  . atenolol  50 mg Oral Daily  . buPROPion  150 mg Oral [id number]  . chlordiazePOXIDE  50 mg Oral Once  . citalopram  40 mg Oral Daily  . feeding supplement  1 Container Oral BID BM  . Fluticasone-Salmeterol  1 puff Inhalation Q12H  . hydrochlorothiazide  25 mg Oral Daily  . lamoTRIgine  25 mg Oral QHS  . multivitamin with minerals  1 tablet Oral Daily  . naproxen  250 mg Oral BID WC  . nicotine  21 mg Transdermal Q0600  . pantoprazole  40 mg Oral QHS  . risperiDONE  4 mg Oral QHS  . thiamine  100 mg Intramuscular Once  . thiamine  100 mg Oral Daily  . traZODone  300 mg Oral  QHS  . DISCONTD: citalopram  20 mg Oral Daily  . DISCONTD: risperiDONE  3 mg Oral QHS    Lab results:  Results for orders placed during the hospital encounter of 02/18/12 (from the past 48 hour(s))  GLUCOSE, CAPILLARY     Status: Abnormal   Collection Time   02/22/12  5:07 PM      Component Value Range Comment   Glucose-Capillary 152 (*) 70 - 99 mg/dL    Comment 1 Notify RN     GLUCOSE, CAPILLARY     Status: Abnormal   Collection Time   02/23/12  6:02 AM      Component Value Range Comment   Glucose-Capillary 114 (*) 70 - 99 mg/dL   GLUCOSE, CAPILLARY     Status: Abnormal   Collection Time   02/23/12 11:39 AM      Component Value Range Comment   Glucose-Capillary 146 (*) 70 - 99 mg/dL   GLUCOSE, CAPILLARY     Status: Abnormal   Collection Time   02/23/12  5:07 PM      Component Value Range Comment   Glucose-Capillary 191 (*) 70 - 99 mg/dL   GLUCOSE, CAPILLARY  Status: Abnormal   Collection Time   02/24/12  6:11 AM      Component Value Range Comment   Glucose-Capillary 112 (*) 70 - 99 mg/dL    Comment 1 Notify RN     GLUCOSE, CAPILLARY     Status: Abnormal   Collection Time   02/24/12 11:50 AM      Component Value Range Comment   Glucose-Capillary 185 (*) 70 - 99 mg/dL     No results found for this or any previous visit for  Last 48 hours.  Group attendance:  Good attendance good participation  ROS:    Constitutional: WDWN Adult in NAD   GI: Negative for N,V,D,C   Neuro: Negative for dizziness, blurred vision, visual changes, headaches   Resp: Negative for wheezing, SOB, cough   Cardio: Negative for CP, diaphoresis, fatigue   MSK: Negative for joint pain, swelling, DROM, or ambulatory difficulties.  Time was spent with the patient discussing the current symptoms and the response to treatment. Today Chee remembers that she is connected to the [facility] [place] and could contact them regarding her placement upon discharge.  She has seen Leroy Libman as her  therapist as well as Dr. Jerilynn Som who was her psychiatrist there.  Treatment Summary: 1. Admit for crisis management and stabilization. 2. Medication management to reduce current symptoms to base line and improve the patient's overall level of functioning 3. Treat health problems as indicated. 4. Develop treatment plan to decrease risk of relapse upon discharge and the need for readmission. 5. Psycho-social education regarding relapse prevention and self care. 6. Health care follow up as needed for medical problems. 7. Restart home medications where appropriate.   Treatment Plan:  Plan:  1. Will continue the medication Celexa to 40 mgs po q am to further address her depression and anxiety.  2. Will continue the medication Risperdal to 4 mgs po qhs for sleep and to further address her psychosis.  3. Will continue the patient on the medication Trazodone at 300 mgs po qhs for sleep.  4. Will continue the patient on a MVI po q am for nutritional supplementation.  5. Will continue the patient on Thiamine 100 mgs po q am for nutritional supplementation.  6. Will continue the patient on her non-psychiatric medications as listed above.  7. Laboratory studies reviewed.  8. Will continue to monitor.  9. Will await information from patient regarding [facility] [place] regarding her placement upon discharge. Rona Ravens. Roxie Gueye [facility] 02/24/2012
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
## 2012-02-24 NOTE — Progress Notes (Signed)
(  D) Patient reports sleep as fair; had a hard time staying asleep, appetite improving, energy level low and ability to pay attention improving. She rates her depression at 8 and hopelessness at 8/10, She is very concerned regarding her housing situation. She has come to the end of her time at [facility] and needs shelter at discharge. Patient reports chilling, diarrhea and tremors on her daily self inventory. She has passive SI and contracts for safety. (A) Encouraged patient to attend all groups, and to go outside for recreation therapy. (R) Patient interacting well in milieu. No further complaints. Joice Lofts RN MS EdS 02/24/2012  4:44 PM

## 2012-02-24 NOTE — Progress Notes (Signed)
D: In dayroom, watching TV with peers on approach. Appears flat and depressed. Calm and cooperative with assessment. States she has had an up/down day. Continues to point to going outside as the only positive from her day. When pressured for more positives she stated she had a good meal too.  Does not feel like her mood has improved much since admission. Currently Denies HI/AH and contracts for safety. Endorses passive SI and contracts for safety. Also endorses VH: Bad figure standing outside her window. Did c/o back pain. Offered no additional questions or concerns.   A: Safety has been maintained with Q15 minute observation. Support and encouragement provided. POC and medications for the shift reviewed and understanding verbalized. Attending groups with appropriate participation. Meds given per MD order. PRN provided for c/o back pain.  R: Pt remains safe. She is calm and cooperative and continues to be depressed. She offers no questions or concerns. Will f/u response to prn, continue Q15 min observation and continue current POC.

## 2012-02-24 NOTE — Progress Notes (Signed)
[facility] Group Notes:  (Counselor/Nursing/MHT/Case Management/Adjunct)  02/24/2012 1:30 PM  Type of Therapy:  Group Therapy  Participation Level:  Active when group- pt pulled to speak with treatment team   Participation Quality:  Appropriate, Attentive and Sharing  Affect:  Appropriate  Cognitive:  Appropriate  Insight:  Good  Engagement in Group:  Good  Engagement in Therapy:  Good  Modes of Intervention:  Problem-solving, Support and exploration  Summary of Progress/Problems:Pt attended group therapy to process feelings surrounding recovery. Pt was able to explore what personal recovery looks and feels like physically, emotionally and spiritually. Pt shared and gave a description of what their personal road to recovery would like in terms of being straight, forked, or with curves.  Pt explored any fears of recovery in terms of possible road blocks and how to find healthy detours and discussed possible ideas for staying on the road to recovery. Pt shared that recovery for her is being happy and doing the things she needs to do. Pt shared that she needs care for herself in terms of eating, washing and taking medications to recover. Pt shared that currently she feels weak and sleepy because of her medications. Pt stayed for half of group and then was called out. Herrick Hartog, LPCA     Johnni Wunschel L 02/24/2012, 1:30 PM

## 2012-02-24 NOTE — Tx Team (Signed)
Interdisciplinary Treatment Plan Update (Adult)  Date: 02/24/2012  Time Reviewed: 1000  Progress in Treatment: Attending groups: Yes Participating in groups:  Yes Taking medication as prescribed: Yes Tolerating medication:  Yes Family/Significant othe contact made:  Social worker will make appropriate support contact. Patient understands diagnosis:  Yes Discussing patient identified problems/goals with staff:  Yes Medical problems stabilized or resolved:  NO. Denies suicidal/homicidal ideation: NO, passive SI. Issues/concerns per patient self-inventory:  None identified Other: N/A  New problem(s) identified: Elevated blood sugar. Diabetic educator to be called per MD.  Reason for Continuation of Hospitalization: Depression Medication stabilization   Interventions implemented related to continuation of hospitalization: mood stabilization, medication monitoring and adjustment, group therapy and psycho education, safety checks q 15 mins. CBG's for elevated blood sugar. Additional comments: N/A  Estimated length of stay: 3-5 days  Discharge Plan: SW is assessing for appropriate referrals.   New goal(s): N/A  Review of initial/current patient goals per problem list:   1.  Goal(s): Reduce depressive symptoms  Met:  No, 9 for dep[ression and 9 for hopelessness out of 10.  Target date: by discharge  As evidenced by: Reducing depression from a 10 to a 3 as reported by pt.   2.  Goal (s): Eliminate Suicidal Ideation  Met:  No, passive SI. Contracts.  Target date: by discharge  As evidenced by: Eliminate suicidal ideation.        Attendees: Patient:  Maureen Swanson 02/24/2012 1000  Family:     Physician:  Franchot Gallo, MD 02/24/2012 1000  Nursing:      Nursing:  Barrie Folk RN MS EdS 02/24/2012 1000  Counselor:  Veto Kemps, MT-BC 02/24/2012 1000  Other:  Joslyn Devon RN 02/24/2012 1000  Other:     Other:     Other:      Scribe for Treatment Team:   Barrie Folk 02/24/2012 6:14 PM

## 2012-02-25 LAB — CBC
MCH: 29.9 pg (ref 26.0–34.0)
MCHC: 32.1 g/dL (ref 30.0–36.0)
MCV: 93.3 fL (ref 78.0–100.0)
Platelets: 195 10*3/uL (ref 150–400)
RBC: 3.41 MIL/uL — ABNORMAL LOW (ref 3.87–5.11)

## 2012-02-25 LAB — BASIC METABOLIC PANEL
CO2: 31 mEq/L (ref 19–32)
Calcium: 9.6 mg/dL (ref 8.4–10.5)
Creatinine, Ser: 0.74 mg/dL (ref 0.50–1.10)
GFR calc non Af Amer: >90 mL/min (ref >90)
Glucose, Bld: 110 mg/dL — ABNORMAL HIGH (ref 70–99)

## 2012-02-25 LAB — MAGNESIUM: Magnesium: 1.7 mg/dL (ref 1.5–2.5)

## 2012-02-25 MED ORDER — DIPHENHYDRAMINE HCL 50 MG PO CAPS: 50.0000 mg | ORAL_CAPSULE | Freq: Four times a day (QID) | ORAL | Status: DC | PRN

## 2012-02-25 MED ORDER — CLONIDINE HCL 0.1 MG/24HR TD PTWK
0.1000 mg | MEDICATED_PATCH | TRANSDERMAL | Status: DC
  Administered 2012-02-25: 0.1 mg via TRANSDERMAL
  Filled 2012-02-25: qty 1

## 2012-02-25 MED ORDER — LORATADINE 10 MG PO TABS
10.0000 mg | ORAL_TABLET | Freq: Every day | ORAL | Status: DC
  Administered 2012-02-25 – 2012-02-27 (×2): 10 mg via ORAL
  Filled 2012-02-25: qty 14
  Filled 2012-02-25 (×4): qty 1
  Filled 2012-02-25: qty 14

## 2012-02-25 MED ORDER — LORATADINE 10 MG PO TABS
10.0000 mg | ORAL_TABLET | Freq: Every day | ORAL | Status: DC
  Filled 2012-02-25: qty 1

## 2012-02-25 NOTE — Progress Notes (Signed)
Agree with the assessment

## 2012-02-25 NOTE — Progress Notes (Signed)
Psychoeducational Group Note  Date:  02/25/2012 Time:  1100  Group Topic/Focus:  Personal Choices and Values:   The focus of this group is to help patients assess and explore the importance of values in their lives, how their values affect their decisions, how they express their values and what opposes their expression.  Participation Level:  Active  Participation Quality:  Appropriate, Sharing and Supportive  Affect:  Appropriate  Cognitive:  Appropriate  Insight:  Good  Engagement in Group:  Good  Additional Comments:  Pt went out to talk to NP, pt came back mid way in and just began to participate.   Isla Pence M 02/25/2012, 1:23 PM

## 2012-02-25 NOTE — Progress Notes (Signed)
1730 Note d/t 1-1 D: Pt. Reports that day has not been too good.Denies SI, & HI.Pt does report auditory & visual hallucinations.Pt is using a W/C A:1-1 continues for increased pt. Safety.R: Pt. Safety maintained.
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
## 2012-02-25 NOTE — Progress Notes (Signed)
2130 1-1 note.D: Pt continues to use the W/C to get around.Affect remains blunted & mood sad.A:1-1 continues for increased pt safety.Pt supported & encouraged.R: Pt safety maintained.

## 2012-02-25 NOTE — Progress Notes (Signed)
Patient's CBG at noon was 115. Did not upload correctly to results flow sheet. Joice Lofts RN MS EdS 02/25/2012  3:01 PM

## 2012-02-25 NOTE — Progress Notes (Signed)
02/25/2012         Time: 0930      Group Topic/Focus: The focus of the group is on enhancing the patients' ability to utilize positive relaxation strategies by practicing several that can be used at discharge.  Participation Level: Did not attend  Participation Quality: Not Applicable  Affect: Not Applicable  Cognitive: Not Applicable   Additional Comments: Patient off-unit for testing.   Kariana Wiles 02/25/2012 1:29 PM

## 2012-02-25 NOTE — Progress Notes (Signed)
RN 1:1 Note: Patient remains on a 1:1 observation for safety. In room resting. Patient states that her right knee is weak from a past surgery. She also reports that she falls at home on occasion and feels most lightheaded when she is rising from a sitting position. Joice Lofts RN MS EdS 02/25/2012  1:47 PM

## 2012-02-25 NOTE — Progress Notes (Signed)
The focus of this group is to help patients review their daily goal of treatment and discuss progress on daily workbooks. Pt attended the group and participated in the discussion about recovery goals.

## 2012-02-25 NOTE — Progress Notes (Signed)
[facility] MD Progress Note  02/25/2012 1:03 PM  Current Mental Status Per Physician:   Diagnosis:  Axis I:   Schizoaffective Disorder - Depressed Type.    Cocaine Abuse.    Alcohol Abuse.  Axis II:  Deferred.  Axis III:  1. Hypertension.    2. Asthma.    3. Osteoarthritis.    4. NIDDM.    5. Back Pain.  Axis IV:  Chronic Mental Illness. Long Standing Substance Abuse Issues. Limited Primary Support. Non-compliance with medications.  Axis V:  GAF at time of admission approximately 35.   The patient was seen today and reports the following:   ADL's: Intact.  Sleep: The patient reports to sleeping well last night. Appetite: The patient reports a decreased appetite today but states this is improving.   Mild>(1-10) >Severe  Hopelessness (1-10): 8  Depression (1-10): 8  Anxiety (1-10): 8   Suicidal Ideation: The patient denies any suicidal ideations today.  Plan: No  Intent: No  Means: No   Homicidal Ideation: The patient denies any homicidal ideations today.  Plan: No  Intent: No.  Means: No   General Appearance/Behavior: The patient was friendly and cooperative today with this provider but again appears significantly depressed.  Eye Contact: Good.  Speech: Appropriate in rate and volume with no pressuring noted today.  Motor Behavior: wnl.  Level of Consciousness: Alert and Oriented x 3.  Mental Status: Alert and Oriented x 3.  Mood: Moderate to Severely Depressed.  Affect: Moderately Constricted.  Anxiety Level: Moderate to Severely anxiety reported today.  Thought Process: Auditory hallucinations reported but improving.  Thought Content: The patient states that she is having ongoing auditory hallucinations but states these are much improved. The patient denies any visual hallucinations or delusional thinking today.  Perception:. Auditory hallucinations reported.  Judgment: Fair.  Insight: Fair.  Cognition: Oriented to person, place and time.  Sleep:  Number of Hours: 6.5     Vital Signs:Blood pressure 152/104, pulse 77, temperature 97.6 F (36.4 C), temperature source Oral, resp. rate 18, height 5' 3.5" (1.613 m), weight 78.926 kg (174 lb).  Current Medications: Current Facility-Administered Medications  Medication Dose Route Frequency Provider Last Rate Last Dose  . acetaminophen (TYLENOL) tablet 650 mg  650 mg Oral Q6H PRN Kerry Hough, PA   650 mg at 02/24/12 1947  . albuterol (PROVENTIL HFA;VENTOLIN HFA) 108 (90 BASE) MCG/ACT inhaler 2 puff  2 puff Inhalation Q6H PRN Verne Spurr, PA-C      . alum & mag hydroxide-simeth (MAALOX/MYLANTA) 200-200-20 MG/5ML suspension 30 mL  30 mL Oral Q4H PRN Kerry Hough, PA      . atenolol (TENORMIN) tablet 50 mg  50 mg Oral Daily Curlene Labrum Shalice Woodring, MD   50 mg at 02/25/12 0756  . buPROPion [facility] SR) 12 hr tablet 150 mg  150 mg Oral BH-q8a2p Curlene Labrum Delisia Mcquiston, MD   150 mg at 02/25/12 0753  . chlordiazePOXIDE (LIBRIUM) capsule 50 mg  50 mg Oral Once Kerry Hough, PA      . citalopram (CELEXA) tablet 40 mg  40 mg Oral Daily Curlene Labrum Wendall Isabell, MD   40 mg at 02/25/12 0754  . cloNIDine (CATAPRES - Dosed in mg/24 hr) patch 0.1 mg  0.1 mg Transdermal Weekly Kerry Hough, PA      . feeding supplement (RESOURCE BREEZE) liquid 1 Container  1 Container Oral BID BM Ronny Bacon, MD   1 Container at 02/24/12 1445  . Fluticasone-Salmeterol (ADVAIR)  250-50 MCG/DOSE inhaler 1 puff  1 puff Inhalation Q12H Curlene Labrum Alawna Graybeal, MD   1 puff at 02/25/12 0753  . hydrochlorothiazide (HYDRODIURIL) tablet 25 mg  25 mg Oral Daily Curlene Labrum Aleeha Boline, MD   25 mg at 02/25/12 0754  . lamoTRIgine (LAMICTAL) tablet 25 mg  25 mg Oral QHS Curlene Labrum Vada Swift, MD   25 mg at 02/24/12 2137  . magnesium hydroxide (MILK OF MAGNESIA) suspension 30 mL  30 mL Oral Daily PRN Kerry Hough, PA      . multivitamin with minerals tablet 1 tablet  1 tablet Oral Daily Curlene Labrum Amias Hutchinson, MD   1 tablet at 02/25/12 0753  . naproxen (NAPROSYN) tablet 250 mg  250 mg  Oral BID WC Curlene Labrum Cicilia Clinger, MD   250 mg at 02/24/12 1654  . nicotine (NICODERM CQ - dosed in mg/24 hours) patch 21 mg  21 mg Transdermal Q0600 Curlene Labrum Jorel Gravlin, MD   21 mg at 02/25/12 0735  . pantoprazole (PROTONIX) EC tablet 40 mg  40 mg Oral QHS Curlene Labrum Bronx Brogden, MD   40 mg at 02/24/12 2136  . risperiDONE (RISPERDAL) tablet 4 mg  4 mg Oral QHS Curlene Labrum Cynara Tatham, MD   4 mg at 02/24/12 2136  . thiamine (B-1) injection 100 mg  100 mg Intramuscular Once Kerry Hough, PA      . thiamine (VITAMIN B-1) tablet 100 mg  100 mg Oral Daily Curlene Labrum Shandale Malak, MD   100 mg at 02/25/12 0754  . traZODone (DESYREL) tablet 300 mg  300 mg Oral QHS Curlene Labrum Karlena Luebke, MD   300 mg at 02/24/12 2136   Lab Results:  Results for orders placed during the hospital encounter of 02/18/12 (from the past 48 hour(s))  GLUCOSE, CAPILLARY     Status: Abnormal   Collection Time   02/23/12  5:07 PM      Component Value Range Comment   Glucose-Capillary 191 (*) 70 - 99 mg/dL   GLUCOSE, CAPILLARY     Status: Abnormal   Collection Time   02/24/12  6:11 AM      Component Value Range Comment   Glucose-Capillary 112 (*) 70 - 99 mg/dL    Comment 1 Notify RN     GLUCOSE, CAPILLARY     Status: Abnormal   Collection Time   02/24/12 11:50 AM      Component Value Range Comment   Glucose-Capillary 185 (*) 70 - 99 mg/dL   GLUCOSE, CAPILLARY     Status: Abnormal   Collection Time   02/24/12  4:52 PM      Component Value Range Comment   Glucose-Capillary 185 (*) 70 - 99 mg/dL   CBC     Status: Abnormal   Collection Time   02/25/12  7:07 AM      Component Value Range Comment   WBC 4.6  4.0 - 10.5 K/uL    RBC 3.41 (*) 3.87 - 5.11 MIL/uL    Hemoglobin 10.2 (*) 12.0 - 15.0 g/dL    HCT 40.9 (*) 81.1 - 46.0 %    MCV 93.3  78.0 - 100.0 fL    MCH 29.9  26.0 - 34.0 pg    MCHC 32.1  30.0 - 36.0 g/dL    RDW 91.4  78.2 - 95.6 %    Platelets 195  150 - 400 K/uL   BASIC METABOLIC PANEL     Status: Abnormal   Collection Time   02/25/12  7:07  AM      Component Value Range Comment   Sodium 140  135 - 145 mEq/L    Potassium 3.8  3.5 - 5.1 mEq/L    Chloride 103  96 - 112 mEq/L    CO2 31  19 - 32 mEq/L    Glucose, Bld 110 (*) 70 - 99 mg/dL    BUN 15  6 - 23 mg/dL    Creatinine, Ser 1.61  0.50 - 1.10 mg/dL    Calcium 9.6  8.4 - 09.6 mg/dL    GFR calc non Af Amer >90  >90 mL/min    GFR calc Af Amer >90  >90 mL/min   MAGNESIUM     Status: Normal   Collection Time   02/25/12  7:07 AM      Component Value Range Comment   Magnesium 1.7  1.5 - 2.5 mg/dL   TROPONIN I     Status: Normal   Collection Time   02/25/12  9:37 AM      Component Value Range Comment   Troponin I <0.30  <0.30 ng/mL    Physical Findings: AIMS: Facial and Oral Movements Muscles of Facial Expression: None, normal Lips and Perioral Area: None, normal Jaw: None, normal Tongue: None, normal,Extremity Movements Upper (arms, wrists, hands, fingers): None, normal Lower (legs, knees, ankles, toes): None, normal, Trunk Movements Neck, shoulders, hips: None, normal, Overall Severity Severity of abnormal movements (highest score from questions above): None, normal Incapacitation due to abnormal movements: None, normal Patient's awareness of abnormal movements (rate only patient's report): No Awareness, Dental Status Current problems with teeth and/or dentures?: No Does patient usually wear dentures?: No  CIWA:  CIWA-Ar Total: 1  COWS:     Review of Systems:  Neurological: The patient denies any headaches today. He denies any seizures or dizziness.  G.I.: The patient denies any constipation today or G.I. Upset.  Musculoskeletal: The patient reports chronic back pain which is currently under good control.   Time was spent today discussing with the patient her current symptoms. The patient states that she is now sleeping well with her current medications but reports a decreased appetite which she states is improving.  The patient reports moderate to severe feelings  of sadness, anhedonia and depressed mood and denies any current suicidal or homicidal ideations today. She reports ongoing auditory hallucinations which are improving and denies any visual hallucinations or delusional thinking today. The patient also reports moderate to severe anxiety symptoms.   The patient fell in her room this morning and states she feel on her right side.  The patient states she is feeling dizzy which may be a side effect to her hypertensive medications.  However the patient was not hypotensive at the time of the fall.  She also reports a history of falls and unsteady gait.  The patient also states she has had a surgical procedure on her right knee in the past and reports some "throbbing" of her knee since the fall.  Treatment Plan Summary:  1. Daily contact with patient to assess and evaluate symptoms and progress in treatment.  2. Medication management  3. The patient will deny suicidal ideations or homicidal ideations for 48 hours prior to discharge and have a depression and anxiety rating of 3 or less. The patient will also deny any auditory or visual hallucinations or delusional thinking.  4. The patient will deny any symptoms of substance withdrawal at time of discharge.   Plan:  1. Will continue the medication  Celexa at 40 mgs po q am to continue to address her depression and anxiety.  2. Will continue the medication Risperdal at 4 mgs po qhs for sleep and to continue to address her psychosis.  3. Will continue the patient on the medication Trazodone at 300 mgs po qhs for sleep.  4. Will continue the patient on a MVI po q am for nutritional supplementation.  5. Will continue the patient on Thiamine 100 mgs po q am for nutritional supplementation.  6. Will continue the patient on her non-psychiatric medications as listed above.  7. The PA will evaluate the patient's knee pain today and will consider imaging after the exam. 8. Laboratory studies reviewed.  9. Will  continue to monitor.   Kesa Birky 02/25/2012, 1:03 PM

## 2012-02-25 NOTE — Progress Notes (Signed)
S: Notified by NS pt had fallen in the bathroom after being on the commode. Pt noted feeling a little lightheaded with unsteady gate prior to her fall. Pt denies any CP, SOB, nausea, diaphoresis sx at this time. Non psychiatric ACS risk factors include Htn, Dm and HLD.  O: v/s: T 97.5  BP 155/110, P 82, R 16, BG: 102 mg/dl  9/4 : K, 3.6, NA 161, Bun 7, Creatinine 0.78, Hgb 12.7  Gen: 46 y/o AAF NAD, A & 0 x 3   HEENT: PERRLA  Pulm: CTA  Cardio: Audiblle s1,s2 without M/G/R  Abd: soft positive BS, exam NT  Neuro: CN 2 - 12 grossly intact  A/P :  1) Syncopal event, diff dx include vagal event, hypoglycemia, ACS, Iatrogenic secondary to psychotropics, BPPV  * 12 lead EKG *  stat BMP, CBC, MG, POCT x one * q 30 min neuro checks x 4, then q 2 hours x 4   * hold  all sedative medications  2) Htn uncontrolled  * Clonidine patch 0.1 MG Q 7 DAYS

## 2012-02-25 NOTE — Progress Notes (Signed)
Psychoeducational Group Note  Date:  02/25/2012 Time:  2000  Group Topic/Focus:  Wrap-Up Group:   The focus of this group is to help patients review their daily goal of treatment and discuss progress on daily workbooks.  Participation Level:  Active  Participation Quality:  Attentive  Affect:  Appropriate  Cognitive:  Alert  Insight:  Good  Engagement in Group:  Good  Additional Comments:  Pt stated that her day did not start off well because she fell this morning when she went to use the bathroom. She also stated that her mood has been depressed today, she did enjoy going outside for some fresh air.   Kaleen Odea R 02/25/2012, 9:18 PM

## 2012-02-26 NOTE — Progress Notes (Signed)
1:1 observation note:  Patient continues to interact with staff and others.  She has passive SI, but contracts for safety on the unit.  She rates her depression as a 7.  She feels hopelessness.  Continue to reassure patient and ensure safety on unit.  Her behavior is appropriate on unit.  She denies any HI/AVH.  Monitor medication management and MD orders.

## 2012-02-26 NOTE — Progress Notes (Signed)
1:1 observation due high fall risk: patient denies any HI/AVH.  She remains depressed with passive SI.  She does contract for safety.  She has been resting quietly today.  She has not complained of any dizziness today.  She rates her depression as a 7.  Continue to monitor medication management and MD orders.  Continue with 1:1 observation.

## 2012-02-26 NOTE — Progress Notes (Signed)
Psychoeducational Group Note  Date:  02/26/2012 Time:  0930  Group Topic/Focus:  Rediscovering Joy:   The focus of this group is to explore various ways to relieve stress in a positive manner.  Participation Level:  Minimal  Participation Quality:  Appropriate  Affect:  Flat  Cognitive:  Oriented  Insight:  Limited  Engagement in Group:  Limited  Additional Comments:  Pt was present at the beginning of group but had to leave early due to the fact that she felt  tired wanted to go lie down.  Colie Josten E 02/26/2012, 12:24 PM

## 2012-02-26 NOTE — Progress Notes (Signed)
Psychoeducational Group Note  Date:  02/26/2012 Time:  2000  Group Topic/Focus:  karaoke  Participation Level:  Active  Participation Quality:  Appropriate  Affect:  Blunted  Cognitive:  Alert  Insight:  Good  Engagement in Group:  Good  Additional Comments:    Kelsey Edman R 02/26/2012, 9:30 PM

## 2012-02-26 NOTE — Progress Notes (Signed)
1:1 observation note:  Patient continues to do well: she is pleasant and cooperative.  She has not complained of any dizziness.  She has passive SI, but contracts for safety.  She denies any HI/AVH.  Continue to observe patient and ensure safety.  Continue to monitor medication management and MD orders.  Continue with glucose monitoring.  Patient's behavior on unit is appropriate.
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
## 2012-02-26 NOTE — Discharge Planning (Signed)
02/26/2012  Pt did not attend d/c planning group on this date. SW met with pt individually at this time.   SW called an rescheduled therapy Appt with A. Branch at [facility] the [place].  SW to continue to monitor and assess for referrals.    Clarice Pole, LCASA 02/26/2012, 2:52 PM

## 2012-02-26 NOTE — Progress Notes (Signed)
[facility] In Patient Progress Note 02/26/2012 11:51 AM [name] JOSANNE OMORI [birth year] [id number] Hospital day #:8 Diagnosis:  Axis I: Schizoaffective Disorder - Depressed Type.  Cocaine Abuse.  Alcohol Abuse. ADL's: intact Sleep:  better Appetite: better  Groups: attending Subjective: Met with patient to discuss her response to treatment and her current symptoms. She states the dizziness is better today and the medication has resolved her knee pain.  She reports she is much better today and is asking when she will go home.   height is 5' 3.5" (1.613 m) and weight is 78.926 kg (174 lb). Her oral temperature is 97.6 F (36.4 C). Her blood pressure is 152/104 and her pulse is 77. Her respiration is 18.   Objective: Patient states no further auditory hallucinations and that she feels much better. Sx of withdrawal: none  Ros: ROS: Constitional: WDWN Adult in NAD COR: negative for SOB, CP, cough, wheezing GI: Negative for Nausea, vomiting, diarrhea, constipation, abdominal pain Neuro: negative for dizziness, blurred vision, headaches, numbness or tingling Ortho: negative for limb pain, swelling, change in ambulatory status.  Mental Status Exam Level of Consciousness: awake Orientation: x 3 General Appearance : neat and clean Behavior:   cooperative Eye Contact:  good Motor Behavior:  normal Speech:  clear Mood:  depressed  Suicidal Ideation: No suicidal ideation, no plan, no intent, no means. Homicidal Ideation:  No homicidal ideation, no plan, no intent, no means.  Affect:  congruent Anxiety Level: moderate Thought Process:  linear Thought Content:  Denies AH today Perception:  itact Judgment:  fair Insight:  fair Cognition:  At least average Sleep:  Number of Hours: 5.75   Lab Results:  Results for orders placed during the hospital encounter of 02/18/12 (from the past 48 hour(s))  GLUCOSE, CAPILLARY     Status: Abnormal   Collection Time   02/24/12  4:52 PM      Component Value  Range Comment   Glucose-Capillary 185 (*) 70 - 99 mg/dL   CBC     Status: Abnormal   Collection Time   02/25/12  7:07 AM      Component Value Range Comment   WBC 4.6  4.0 - 10.5 K/uL    RBC 3.41 (*) 3.87 - 5.11 MIL/uL    Hemoglobin 10.2 (*) 12.0 - 15.0 g/dL    HCT 40.9 (*) 81.1 - 46.0 %    MCV 93.3  78.0 - 100.0 fL    MCH 29.9  26.0 - 34.0 pg    MCHC 32.1  30.0 - 36.0 g/dL    RDW 91.4  78.2 - 95.6 %    Platelets 195  150 - 400 K/uL   BASIC METABOLIC PANEL     Status: Abnormal   Collection Time   02/25/12  7:07 AM      Component Value Range Comment   Sodium 140  135 - 145 mEq/L    Potassium 3.8  3.5 - 5.1 mEq/L    Chloride 103  96 - 112 mEq/L    CO2 31  19 - 32 mEq/L    Glucose, Bld 110 (*) 70 - 99 mg/dL    BUN 15  6 - 23 mg/dL    Creatinine, Ser 2.13  0.50 - 1.10 mg/dL    Calcium 9.6  8.4 - 08.6 mg/dL    GFR calc non Af Amer >90  >90 mL/min    GFR calc Af Amer >90  >90 mL/min   MAGNESIUM     Status: Normal  Collection Time   02/25/12  7:07 AM      Component Value Range Comment   Magnesium 1.7  1.5 - 2.5 mg/dL   TROPONIN I     Status: Normal   Collection Time   02/25/12  9:37 AM      Component Value Range Comment   Troponin I <0.30  <0.30 ng/mL   GLUCOSE, CAPILLARY     Status: Abnormal   Collection Time   02/26/12 11:44 AM      Component Value Range Comment   Glucose-Capillary 143 (*) 70 - 99 mg/dL    Labs are reviewed. Physical Findings: Right Knee exam has pain over the medial and lateral collateral ligament insertion on palpation. Negative drawer sign, no ballotment, negative Lachman's, no medial or lateral joint line tenderness.  Neuro: Rhomberg + for tilt after 25 seconds. ? Worrisome for imbalance due to limb pain. AIMS: CIWA:  CIWA-Ar Total: 1  COWS:    Medication:   . atenolol  50 mg Oral Daily  . buPROPion  150 mg Oral [id number]  . chlordiazePOXIDE  50 mg Oral Once  . citalopram  40 mg Oral Daily  . cloNIDine  0.1 mg Transdermal Weekly  . feeding  supplement  1 Container Oral BID BM  . Fluticasone-Salmeterol  1 puff Inhalation Q12H  . hydrochlorothiazide  25 mg Oral Daily  . lamoTRIgine  25 mg Oral QHS  . loratadine  10 mg Oral Daily  . multivitamin with minerals  1 tablet Oral Daily  . naproxen  250 mg Oral BID WC  . nicotine  21 mg Transdermal Q0600  . pantoprazole  40 mg Oral QHS  . risperiDONE  4 mg Oral QHS  . thiamine  100 mg Intramuscular Once  . thiamine  100 mg Oral Daily  . traZODone  300 mg Oral QHS  . DISCONTD: loratadine  10 mg Oral Daily    Treatment Plan Summary: 1. Admit for crisis management and stabilization. 2. Medication management to reduce current symptoms to base line and improve the     patient's overall level of functioning 3. Treat health problems as indicated. 4. Develop treatment plan to decrease risk of relapse upon discharge and the need for     readmission. 5. Psycho-social education regarding relapse prevention and self care. 6. Health care follow up as needed for medical problems. 7. Restart home medications where appropriate.   Plan: 1. Continue all medications as written. 2. Patient educated to continue to push fluids. 3. Continue to get up slowly. 4. Will continue to follow. Rona Ravens. Cerys Winget PAC 02/26/2012, 11:51 AM

## 2012-02-27 LAB — GLUCOSE, CAPILLARY
Glucose-Capillary: 102 mg/dL — ABNORMAL HIGH (ref 70–99)
Glucose-Capillary: 115 mg/dL — ABNORMAL HIGH (ref 70–99)
Glucose-Capillary: 202 mg/dL — ABNORMAL HIGH (ref 70–99)
Glucose-Capillary: 264 mg/dL — ABNORMAL HIGH (ref 70–99)

## 2012-02-27 MED ORDER — ATENOLOL 50 MG PO TABS: 50.0000 mg | ORAL_TABLET | Freq: Every day | ORAL | Status: DC

## 2012-02-27 MED ORDER — LORATADINE 10 MG PO TABS: 10.0000 mg | ORAL_TABLET | Freq: Every day | ORAL | Status: DC

## 2012-02-27 MED ORDER — CLONIDINE HCL 0.1 MG/24HR TD PTWK: 1.0000 | MEDICATED_PATCH | TRANSDERMAL | Status: DC

## 2012-02-27 MED ORDER — ALBUTEROL SULFATE HFA 108 (90 BASE) MCG/ACT IN AERS: 2.0000 | INHALATION_SPRAY | Freq: Four times a day (QID) | RESPIRATORY_TRACT | Status: DC | PRN

## 2012-02-27 MED ORDER — HYDROCHLOROTHIAZIDE 25 MG PO TABS: 25.0000 mg | ORAL_TABLET | Freq: Every day | ORAL | Status: DC

## 2012-02-27 MED ORDER — TRAZODONE HCL 300 MG PO TABS: 300.0000 mg | ORAL_TABLET | Freq: Every day | ORAL | Status: DC

## 2012-02-27 MED ORDER — ADULT MULTIVITAMIN W/MINERALS CH: 1.0000 | ORAL_TABLET | Freq: Every day | ORAL | Status: DC

## 2012-02-27 MED ORDER — NAPROXEN 250 MG PO TABS: 250.0000 mg | ORAL_TABLET | Freq: Two times a day (BID) | ORAL | Status: DC

## 2012-02-27 MED ORDER — RISPERIDONE 4 MG PO TABS: 4.0000 mg | ORAL_TABLET | Freq: Every day | ORAL | Status: DC

## 2012-02-27 MED ORDER — PANTOPRAZOLE SODIUM 40 MG PO TBEC: 40.0000 mg | DELAYED_RELEASE_TABLET | Freq: Every day | ORAL | Status: DC

## 2012-02-27 MED ORDER — BUPROPION HCL ER (SR) 150 MG PO TB12: 150.0000 mg | ORAL_TABLET | ORAL | Status: DC

## 2012-02-27 MED ORDER — FLUTICASONE-SALMETEROL 250-50 MCG/DOSE IN AEPB: 1.0000 | INHALATION_SPRAY | Freq: Two times a day (BID) | RESPIRATORY_TRACT | Status: DC

## 2012-02-27 MED ORDER — LAMOTRIGINE 25 MG PO TABS: 25.0000 mg | ORAL_TABLET | Freq: Every day | ORAL | Status: DC

## 2012-02-27 MED ORDER — CITALOPRAM HYDROBROMIDE 40 MG PO TABS: 40.0000 mg | ORAL_TABLET | Freq: Every day | ORAL | Status: DC
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
## 2012-02-27 NOTE — Progress Notes (Signed)
Patient ID: Maureen Swanson, female   DOB: 1966/01/29, 46 y.o.   MRN: [id number] Nursing d/c note: Patient is cooperative at d/c process. Reviewed all medications and rx's with samples supplied.  Reviewed all f/u appointments and patient verbalized understanding.  Encouraged 12 step meeting today and importance of temporary sposorship. Denies SI/HI and states all treatment goals were met. All belongings returned and escorted to lobby with bus stop instructions.
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
## 2012-02-27 NOTE — Tx Team (Signed)
Interdisciplinary Treatment Plan Update (Adult) Date: 02/27/2012  Time Reviewed: 11:00 Progress in Treatment: Attending groups: Yes Participating in groups: Yes Taking medication as prescribed: Yes Tolerating medication: Yes Family/Significant othe contact made: Counselor contacted sister. Patient understands diagnosis: Yes Discussing patient identified problems/goals with staff: Yes Medical problems stabilized or resolved: Yes Denies suicidal/homicidal ideation: Yes Issues/concerns per patient self-inventory: None identified Other: N/A New problem(s) identified: None Identified  Interventions implemented related to continuation of hospitalization: mood stabilization, medication monitoring and adjustment, group therapy and psycho education, safety checks q 15 mins Additional comments: N/A Estimated length of stay: 02-27-12 Discharge Plan: [facility] of the [place] and Treatment at [facility] or [facility] upon bed opening for treatment New goal(s): N/A Review of initial/current patient goals per problem list:  1. Goal(s): Reduce depressive symptoms  Met: Yes  Target date: by discharge  As evidenced by: Reducing depression from a 10 to a 3 as reported by pt.  2. Goal (s): Eliminate Suicidal Ideation  Met: Yes  Target date: by discharge  As evidenced by: Eliminate suicidal ideation.  3. Goal(s): Reduce Psychosis  Met: Yes  Target date: by discharge  As evidenced by: Reduce psychotic symptoms to baseline, as reported by pt.  4. Goal(s): Follow-up   Met: Yes  Target date: by discharge  As evidenced by:  Patient to follow-up with [facility] of the [place].  Patient to follow-up with [facility] and [facility] if she is interested in treatment.   Attendees: Patient: Maureen Swanson  02/27/2012  Family:    Physician: Franchot Gallo, MD 02/27/2012 3:45 PM   Nursing:    Case Manager: Clarice Pole, LCASA 02/27/2012 3:45 PM   Counselor: Marni Griffon, LCAS 02/27/2012 3:45 PM     Other: Abbie Sons, RN 02/27/2012 3:45 PM   Other:    Other:    Other:    Scribe for Treatment Team:  Clarice Pole, LCASA 02/27/2012 3:45 PM

## 2012-02-27 NOTE — Discharge Planning (Signed)
02/27/2012  SW met with Veva Holes in discharge planning group.  SW found Maureen Swanson has current service providers, and they are [employer] [facility] of the [place].  SW found GORDIE WOOTTEN to rate her depression at none and anxiety at 1/10 today.  Pt states she wants to go home and stay for four days and then go stay with her sister who has transportation until [facility] has a bed.  Pt states she will contact [facility] for bed.  SW will continue to assess for referrals.  Clarice Pole, LCASA 02/27/2012, 10:20 AM

## 2012-03-03 ENCOUNTER — Telehealth (HOSPITAL_COMMUNITY): Payer: Self-pay | Admitting: *Deleted

## 2012-03-03 ENCOUNTER — Other Ambulatory Visit: Payer: Self-pay | Admitting: Internal Medicine

## 2012-03-03 DIAGNOSIS — R928 Other abnormal and inconclusive findings on diagnostic imaging of breast: Secondary | ICD-10-CM

## 2012-03-03 NOTE — Telephone Encounter (Signed)
Patient called today in regards to missing her last [facility] appointment. Patient has missed 2 appointments for Clinic. Patient missed the first appointment due to over sleeping. Patient stated she missed the second appointment due to being in the hospital. Rescheduled patients appointment to next Tuesday, March 09, 2012 at 1115. Told patient if she misses this appointment she will no longer be eligible for the [facility] program. Told patient if patients no call no show for 2 appointments are no longer eligible for [facility]. Told patient will give this one additional time due being in hospital. Patient verbalized understanding.
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
## 2012-03-09 ENCOUNTER — Encounter (HOSPITAL_COMMUNITY): Payer: Self-pay

## 2012-03-09 ENCOUNTER — Ambulatory Visit (HOSPITAL_COMMUNITY)
Admission: RE | Admit: 2012-03-09 | Discharge: 2012-03-09 | Disposition: A | Discharge status: Home / Self Care | Payer: Self-pay | Source: Ambulatory Visit | Attending: Obstetrics and Gynecology | Admitting: Obstetrics and Gynecology

## 2012-03-09 DIAGNOSIS — Z1239 Encounter for other screening for malignant neoplasm of breast: Secondary | ICD-10-CM

## 2012-03-09 DIAGNOSIS — N6310 Unspecified lump in the right breast, unspecified quadrant: Secondary | ICD-10-CM

## 2012-03-16 ENCOUNTER — Ambulatory Visit
Admission: RE | Admit: 2012-03-16 | Discharge: 2012-03-16 | Disposition: A | Discharge status: Home / Self Care | Payer: No Typology Code available for payment source | Source: Ambulatory Visit | Attending: Internal Medicine | Admitting: Internal Medicine

## 2012-03-16 ENCOUNTER — Other Ambulatory Visit: Payer: Self-pay | Admitting: Nurse Practitioner

## 2012-03-16 DIAGNOSIS — N6099 Unspecified benign mammary dysplasia of unspecified breast: Secondary | ICD-10-CM

## 2012-03-16 DIAGNOSIS — R928 Other abnormal and inconclusive findings on diagnostic imaging of breast: Secondary | ICD-10-CM

## 2012-03-16 HISTORY — DX: Unspecified benign mammary dysplasia of unspecified breast: N60.99

## 2012-03-19 ENCOUNTER — Emergency Department (HOSPITAL_COMMUNITY)
Admission: EM | Admit: 2012-03-19 | Discharge: 2012-03-20 | Disposition: A | Discharge status: Home / Self Care | Payer: Self-pay

## 2012-03-19 ENCOUNTER — Encounter (HOSPITAL_COMMUNITY): Payer: Self-pay | Admitting: *Deleted

## 2012-03-19 DIAGNOSIS — I10 Essential (primary) hypertension: Secondary | ICD-10-CM | POA: Insufficient documentation

## 2012-03-19 DIAGNOSIS — Z79899 Other long term (current) drug therapy: Secondary | ICD-10-CM | POA: Insufficient documentation

## 2012-03-19 DIAGNOSIS — R109 Unspecified abdominal pain: Secondary | ICD-10-CM | POA: Insufficient documentation

## 2012-03-19 DIAGNOSIS — F1010 Alcohol abuse, uncomplicated: Secondary | ICD-10-CM | POA: Insufficient documentation

## 2012-03-19 DIAGNOSIS — F17200 Nicotine dependence, unspecified, uncomplicated: Secondary | ICD-10-CM | POA: Insufficient documentation

## 2012-03-19 DIAGNOSIS — R10814 Left lower quadrant abdominal tenderness: Secondary | ICD-10-CM | POA: Insufficient documentation

## 2012-03-19 DIAGNOSIS — E119 Type 2 diabetes mellitus without complications: Secondary | ICD-10-CM | POA: Insufficient documentation

## 2012-03-19 DIAGNOSIS — J45909 Unspecified asthma, uncomplicated: Secondary | ICD-10-CM | POA: Insufficient documentation

## 2012-03-19 DIAGNOSIS — F1410 Cocaine abuse, uncomplicated: Secondary | ICD-10-CM | POA: Insufficient documentation

## 2012-03-19 LAB — CBC WITH DIFFERENTIAL/PLATELET
Eosinophils Absolute: 0 10*3/uL (ref 0.0–0.7)
HCT: 31.7 % — ABNORMAL LOW (ref 36.0–46.0)
Hemoglobin: 10.7 g/dL — ABNORMAL LOW (ref 12.0–15.0)
Lymphs Abs: 1.8 10*3/uL (ref 0.7–4.0)
MCH: 29.6 pg (ref 26.0–34.0)
Monocytes Absolute: 0.5 10*3/uL (ref 0.1–1.0)
Monocytes Relative: 6 % (ref 3–12)
Neutrophils Relative %: 71 % (ref 43–77)
RBC: 3.61 MIL/uL — ABNORMAL LOW (ref 3.87–5.11)

## 2012-03-19 LAB — URINALYSIS, ROUTINE W REFLEX MICROSCOPIC
Leukocytes, UA: NEGATIVE
Nitrite: NEGATIVE
Specific Gravity, Urine: 1.011 (ref 1.005–1.030)
pH: 5.5 (ref 5.0–8.0)

## 2012-03-19 LAB — RAPID URINE DRUG SCREEN, HOSP PERFORMED
Amphetamines: NOT DETECTED
Barbiturates: NOT DETECTED
Benzodiazepines: NOT DETECTED
Cocaine: POSITIVE — AB
Opiates: NOT DETECTED
Tetrahydrocannabinol: NOT DETECTED

## 2012-03-19 LAB — COMPREHENSIVE METABOLIC PANEL
Alkaline Phosphatase: 62 U/L (ref 39–117)
BUN: 13 mg/dL (ref 6–23)
Chloride: 99 mEq/L (ref 96–112)
Creatinine, Ser: 0.83 mg/dL (ref 0.50–1.10)
GFR calc Af Amer: >90 mL/min (ref >90)
Glucose, Bld: 83 mg/dL (ref 70–99)
Potassium: 3.4 mEq/L — ABNORMAL LOW (ref 3.5–5.1)
Total Bilirubin: 0.9 mg/dL (ref 0.3–1.2)
Total Protein: 7.2 g/dL (ref 6.0–8.3)

## 2012-03-19 LAB — SALICYLATE LEVEL: Salicylate Lvl: <2 mg/dL — ABNORMAL LOW (ref 2.8–20.0)

## 2012-03-19 LAB — ETHANOL: Alcohol, Ethyl (B): 50 mg/dL — ABNORMAL HIGH (ref 0–11)

## 2012-03-19 LAB — PREGNANCY, URINE: Preg Test, Ur: NEGATIVE

## 2012-03-19 MED ORDER — RISPERIDONE 2 MG PO TABS
4.0000 mg | ORAL_TABLET | Freq: Every day | ORAL | Status: DC
Start: 2012-03-19 — End: 2012-03-20
  Administered 2012-03-19: 4 mg via ORAL
  Filled 2012-03-19: qty 2

## 2012-03-19 MED ORDER — THIAMINE HCL 100 MG/ML IJ SOLN: 100.0000 mg | Freq: Every day | INTRAMUSCULAR | Status: DC

## 2012-03-19 MED ORDER — LORAZEPAM 1 MG PO TABS: 0.0000 mg | ORAL_TABLET | Freq: Two times a day (BID) | ORAL | Status: DC

## 2012-03-19 MED ORDER — LORAZEPAM 1 MG PO TABS: 1.0000 mg | ORAL_TABLET | Freq: Four times a day (QID) | ORAL | Status: DC | PRN

## 2012-03-19 MED ORDER — HYDROCHLOROTHIAZIDE 25 MG PO TABS
25.0000 mg | ORAL_TABLET | Freq: Every day | ORAL | Status: DC
  Administered 2012-03-19 – 2012-03-20 (×2): 25 mg via ORAL
  Filled 2012-03-19 (×2): qty 1

## 2012-03-19 MED ORDER — NICOTINE 21 MG/24HR TD PT24
21.0000 mg | MEDICATED_PATCH | Freq: Every day | TRANSDERMAL | Status: DC
  Administered 2012-03-19 – 2012-03-20 (×3): 21 mg via TRANSDERMAL
  Filled 2012-03-19 (×2): qty 1

## 2012-03-19 MED ORDER — CITALOPRAM HYDROBROMIDE 40 MG PO TABS
40.0000 mg | ORAL_TABLET | Freq: Every day | ORAL | Status: DC
  Administered 2012-03-19 – 2012-03-20 (×2): 40 mg via ORAL
  Filled 2012-03-19 (×2): qty 1

## 2012-03-19 MED ORDER — TRAZODONE HCL 100 MG PO TABS
300.0000 mg | ORAL_TABLET | Freq: Every day | ORAL | Status: DC
  Administered 2012-03-19: 300 mg via ORAL
  Filled 2012-03-19: qty 3

## 2012-03-19 MED ORDER — ADULT MULTIVITAMIN W/MINERALS CH
1.0000 | ORAL_TABLET | Freq: Every day | ORAL | Status: DC
  Administered 2012-03-19 – 2012-03-20 (×2): 1 via ORAL
  Filled 2012-03-19 (×2): qty 1

## 2012-03-19 MED ORDER — NAPROXEN 500 MG PO TABS
250.0000 mg | ORAL_TABLET | Freq: Two times a day (BID) | ORAL | Status: DC
  Administered 2012-03-20: 250 mg via ORAL
  Filled 2012-03-19: qty 1

## 2012-03-19 MED ORDER — FOLIC ACID 1 MG PO TABS
1.0000 mg | ORAL_TABLET | Freq: Every day | ORAL | Status: DC
  Administered 2012-03-19 – 2012-03-20 (×2): 1 mg via ORAL
  Filled 2012-03-19 (×2): qty 1

## 2012-03-19 MED ORDER — LORATADINE 10 MG PO TABS
10.0000 mg | ORAL_TABLET | Freq: Every day | ORAL | Status: DC
  Administered 2012-03-19 – 2012-03-20 (×2): 10 mg via ORAL
  Filled 2012-03-19 (×2): qty 1

## 2012-03-19 MED ORDER — IBUPROFEN 600 MG PO TABS: 600.0000 mg | ORAL_TABLET | Freq: Three times a day (TID) | ORAL | Status: DC | PRN

## 2012-03-19 MED ORDER — LORAZEPAM 1 MG PO TABS: 1.0000 mg | ORAL_TABLET | Freq: Three times a day (TID) | ORAL | Status: DC | PRN

## 2012-03-19 MED ORDER — ALBUTEROL SULFATE HFA 108 (90 BASE) MCG/ACT IN AERS: 2.0000 | INHALATION_SPRAY | Freq: Four times a day (QID) | RESPIRATORY_TRACT | Status: DC | PRN

## 2012-03-19 MED ORDER — LAMOTRIGINE 25 MG PO TABS
25.0000 mg | ORAL_TABLET | Freq: Every day | ORAL | Status: DC
  Administered 2012-03-19: 25 mg via ORAL
  Filled 2012-03-19 (×2): qty 1

## 2012-03-19 MED ORDER — PANTOPRAZOLE SODIUM 40 MG PO TBEC
40.0000 mg | DELAYED_RELEASE_TABLET | Freq: Every day | ORAL | Status: DC
Start: 2012-03-19 — End: 2012-03-20
  Administered 2012-03-19: 40 mg via ORAL
  Filled 2012-03-19: qty 1

## 2012-03-19 MED ORDER — ONDANSETRON HCL 4 MG PO TABS: 4.0000 mg | ORAL_TABLET | Freq: Three times a day (TID) | ORAL | Status: DC | PRN

## 2012-03-19 MED ORDER — ATENOLOL 50 MG PO TABS
50.0000 mg | ORAL_TABLET | Freq: Every day | ORAL | Status: DC
  Administered 2012-03-19 – 2012-03-20 (×2): 50 mg via ORAL
  Filled 2012-03-19 (×2): qty 1

## 2012-03-19 MED ORDER — ACETAMINOPHEN 325 MG PO TABS: 650.0000 mg | ORAL_TABLET | ORAL | Status: DC | PRN

## 2012-03-19 MED ORDER — ZOLPIDEM TARTRATE 5 MG PO TABS: 5.0000 mg | ORAL_TABLET | Freq: Every evening | ORAL | Status: DC | PRN

## 2012-03-19 MED ORDER — NAPROXEN 500 MG PO TABS
500.0000 mg | ORAL_TABLET | Freq: Once | ORAL | Status: AC
  Administered 2012-03-19: 500 mg via ORAL
  Filled 2012-03-19: qty 1

## 2012-03-19 MED ORDER — BUPROPION HCL ER (SR) 150 MG PO TB12
150.0000 mg | ORAL_TABLET | ORAL | Status: DC
  Administered 2012-03-20: 150 mg via ORAL
  Filled 2012-03-19 (×3): qty 1

## 2012-03-19 MED ORDER — LORAZEPAM 2 MG/ML IJ SOLN: 1.0000 mg | Freq: Four times a day (QID) | INTRAMUSCULAR | Status: DC | PRN

## 2012-03-19 MED ORDER — FLUTICASONE-SALMETEROL 250-50 MCG/DOSE IN AEPB
1.0000 | INHALATION_SPRAY | Freq: Two times a day (BID) | RESPIRATORY_TRACT | Status: DC
  Administered 2012-03-19 – 2012-03-20 (×3): 1 via RESPIRATORY_TRACT
  Filled 2012-03-19: qty 14

## 2012-03-19 MED ORDER — ALUM & MAG HYDROXIDE-SIMETH 200-200-20 MG/5ML PO SUSP: 30.0000 mL | ORAL | Status: DC | PRN

## 2012-03-19 MED ORDER — VITAMIN B-1 100 MG PO TABS
100.0000 mg | ORAL_TABLET | Freq: Every day | ORAL | Status: DC
  Administered 2012-03-19 – 2012-03-20 (×2): 100 mg via ORAL
  Filled 2012-03-19 (×2): qty 1

## 2012-03-19 MED ORDER — LORAZEPAM 1 MG PO TABS
0.0000 mg | ORAL_TABLET | Freq: Four times a day (QID) | ORAL | Status: DC
  Administered 2012-03-19: 2 mg via ORAL
  Filled 2012-03-19: qty 2

## 2012-03-19 NOTE — ED Notes (Signed)
NWG:[id number]<ZH> Expected date:03/19/12<BR> Expected time:10:13 AM<BR> Means of arrival:Ambulance<BR> Comments:<BR> 62 abdominal pain,detox

## 2012-03-19 NOTE — ED Notes (Signed)
Gave pt a diet gingerale 

## 2012-03-19 NOTE — ED Provider Notes (Signed)
Medical screening examination/treatment/procedure(s) were performed by non-physician practitioner and as supervising physician I was immediately available for consultation/collaboration.   Maureen Swanson. Lynnelle Mesmer, MD 03/19/12 2251

## 2012-03-19 NOTE — ED Notes (Signed)
Pt eating dinner. No distress.  Pt reports feeling a little sleepy from the Ativan.  Waiting for act team to evaluate.

## 2012-03-19 NOTE — ED Notes (Signed)
Pt sts shes hearing voices and is seeing people that are no longer alive

## 2012-03-19 NOTE — ED Provider Notes (Signed)
History     CSN: [id number]  Arrival date & time 03/19/12  1021   First MD Initiated Contact with Patient 03/19/12 1034      Chief Complaint  Patient presents with  . Abdominal Pain    (Consider location/radiation/quality/duration/timing/severity/associated sxs/prior treatment) HPI Comments: Patient presents today voluntarily for detox from alcohol.  She states that she drinks two 40's daily.  Last alcohol use was last evening.  Patient currently is not having any symptoms of withdrawal.  She states that she has gone through detox before, more recently one month ago.  Patient denies SI or HI.  Patient has also been having abdominal pain for the past 2-3 months.  Pain located in the LLQ.  Pain has been intermittent.  She describes the pain as a cramping pain.  She has been taking Naproxen for the pain, which does help. She denies nausea, vomiting, and diarrhea.  She reports that she has been having regular bowel movements.  No fever or chills.  Pain is no different today than the pain that she had three months ago.    Patient is a 46 y.o. female presenting with abdominal pain. The history is provided by the patient.  Abdominal Pain The primary symptoms of the illness include abdominal pain. The primary symptoms of the illness do not include nausea, vomiting, diarrhea, dysuria, vaginal discharge or vaginal bleeding.  Symptoms associated with the illness do not include constipation, urgency, hematuria or frequency.    Past Medical History  Diagnosis Date  . Diabetes mellitus   . Hypertension   . Asthma   . High cholesterol   . Depression   . Gout   . Anxiety   . Bipolar 1 disorder   . GERD (gastroesophageal reflux disease)     Past Surgical History  Procedure Date  . Joint replacement     right knee    No family history on file.  History  Substance Use Topics  . Smoking status: Current Every Day Smoker -- 1.0 packs/day for 15 years    Types: Cigarettes  . Smokeless  tobacco: Never Used  . Alcohol Use: 7.2 oz/week    12 Cans of beer per week     drinks 4- 40oz/day    OB History    Grav Para Term Preterm Abortions TAB SAB Ect Mult Living                  Review of Systems  Gastrointestinal: Positive for abdominal pain. Negative for nausea, vomiting, diarrhea, constipation, blood in stool and abdominal distention.  Genitourinary: Negative for dysuria, urgency, frequency, hematuria, decreased urine volume, vaginal bleeding and vaginal discharge.  Psychiatric/Behavioral: Negative for suicidal ideas, hallucinations, confusion and self-injury. The patient is not nervous/anxious.     Allergies  Penicillins; Chocolate; and Orange  Home Medications   Current Outpatient Rx  Name Route Sig Dispense Refill  . ALBUTEROL SULFATE HFA 108 (90 BASE) MCG/ACT IN AERS Inhalation Inhale 2 puffs into the lungs every 6 (six) hours as needed for wheezing or shortness of breath. 18 g 0  . ATENOLOL 50 MG PO TABS Oral Take 1 tablet (50 mg total) by mouth daily. For blood pressure control. 30 tablet 0  . BUPROPION HCL ER (SR) 150 MG PO TB12 Oral Take 1 tablet (150 mg total) by mouth 2 (two) times daily at 8am and 2pm. For depression. 60 tablet 0  . CITALOPRAM HYDROBROMIDE 40 MG PO TABS Oral Take 1 tablet (40 mg total) by mouth  daily. For depression and anxiety. 30 tablet 0  . CLONIDINE HCL 0.1 MG/24HR TD PTWK Transdermal Place 1 patch (0.1 mg total) onto the skin once a week. For blood pressure control.  Please replace patch on March 03, 2012. 4 patch 0  . FLUTICASONE-SALMETEROL 250-50 MCG/DOSE IN AEPB Inhalation Inhale 1 puff into the lungs every 12 (twelve) hours. For asthma. 60 each 0  . HYDROCHLOROTHIAZIDE 25 MG PO TABS Oral Take 1 tablet (25 mg total) by mouth daily. For blood pressure control. 30 tablet 0  . LAMOTRIGINE 25 MG PO TABS Oral Take 1 tablet (25 mg total) by mouth at bedtime. For mood stabilization. 1 tablet 30  . LORATADINE 10 MG PO TABS Oral Take 1  tablet (10 mg total) by mouth daily. For seasonal allergies. 30 tablet 0  . ADULT MULTIVITAMIN W/MINERALS CH Oral Take 1 tablet by mouth daily. For nutritional supplementation. 30 tablet 0  . NAPROXEN 250 MG PO TABS Oral Take 1 tablet (250 mg total) by mouth 2 (two) times daily with a meal. For pain. 60 tablet 0  . PANTOPRAZOLE SODIUM 40 MG PO TBEC Oral Take 1 tablet (40 mg total) by mouth at bedtime. For Reflux. 30 tablet 0  . RISPERIDONE 4 MG PO TABS Oral Take 1 tablet (4 mg total) by mouth at bedtime. For psychosis. 30 tablet 0  . TRAZODONE HCL 300 MG PO TABS Oral Take 1 tablet (300 mg total) by mouth at bedtime. For sleep. 30 tablet 0    BP 157/99  Pulse 82  Temp 98.8 F (37.1 C) (Oral)  Resp 20  SpO2 100%  Physical Exam  Nursing note and vitals reviewed. Constitutional: She appears well-developed and well-nourished. No distress.  HENT:  Head: Normocephalic and atraumatic.  Cardiovascular: Normal rate, regular rhythm and normal heart sounds.   Pulmonary/Chest: Effort normal and breath sounds normal.  Abdominal: Soft. Normal appearance and bowel sounds are normal. She exhibits no distension and no mass. There is tenderness in the left lower quadrant. There is no rigidity, no rebound and no guarding.       Mild abdominal tenderness to palpation of the LLQ  Neurological: She is alert.  Skin: Skin is warm and dry. She is not diaphoretic.  Psychiatric: She has a normal mood and affect.    ED Course  Procedures (including critical care time)  Labs Reviewed  CBC WITH DIFFERENTIAL - Abnormal; Notable for the following:    RBC 3.61 (*)     Hemoglobin 10.7 (*)     HCT 31.7 (*)     All other components within normal limits  PREGNANCY, URINE  URINALYSIS, ROUTINE W REFLEX MICROSCOPIC  COMPREHENSIVE METABOLIC PANEL  URINE RAPID DRUG SCREEN (HOSP PERFORMED)  ACETAMINOPHEN LEVEL  SALICYLATE LEVEL  ETHANOL  GC/CHLAMYDIA PROBE AMP, GENITAL  WET PREP, GENITAL   No results  found.   No diagnosis found.  Discussed with ACT team who has agreed to see the patient.  MDM  Patient presenting today requesting detox from alcohol.  She reports that she has been drinking daily.  CIWA orders have been placed.  Psych holding orders and med rec orders have also been placed.  ACT team has been consulted.        Pascal Lux [place], PA-C 03/19/12 2247

## 2012-03-20 NOTE — ED Notes (Signed)
Written instructions reviewed w/ pt, pt verbalized understanding.  Pt encouraged to follow up as instructed w/ OP, and to return for any return of suicidal thoughts or urges.  Pt verbalized understanding and reported that she would.  Belongings to be returned on dc.  Pt ambulatory.

## 2012-03-20 NOTE — BH Assessment (Signed)
Assessment Note   Maureen Swanson is an 46 y.o. female who presents to [facility] voluntarily endorsing SI with a plan to overdose, HI with no plan towards her friend, Maureen Swanson, and SA. She reports she has a history of mental health concerns and has not been taking her medication regularly. She states she has been seeing shadow people and hearing voices for a few weeks and has been endorsing SI for 1 day. She reports drinking 2-3 40 ounce beers daily and using cocaine 2 times monthly. She reports she is "tired of feeling tired" and is requesting inpatient treatment to help with both mental health concerns and SA.  She currently lives with a friend who she states is supportive of her. She appears disheveled and apprehensive.    Axis I: Schizoaffective Disorder and Substance Abuse Axis II: Deferred Axis III:  Past Medical History  Diagnosis Date  . Diabetes mellitus   . Hypertension   . Asthma   . High cholesterol   . Depression   . Gout   . Anxiety   . Bipolar 1 disorder   . GERD (gastroesophageal reflux disease)    Axis IV: other psychosocial or environmental problems Axis V: 31-40 impairment in reality testing  Past Medical History:  Past Medical History  Diagnosis Date  . Diabetes mellitus   . Hypertension   . Asthma   . High cholesterol   . Depression   . Gout   . Anxiety   . Bipolar 1 disorder   . GERD (gastroesophageal reflux disease)     Past Surgical History  Procedure Date  . Joint replacement     right knee    Family History: No family history on file.  Social History:  reports that she has been smoking Cigarettes.  She has a 15 pack-year smoking history. She has never used smokeless tobacco. She reports that she drinks about 7.2 ounces of alcohol per week. She reports that she uses illicit drugs (Cocaine) about once per week.  Additional Social History:  Alcohol / Drug Use History of alcohol / drug use?: Yes Substance #1 Name of Substance 1: alcohol 1 - Age of First  Use: 14 1 - Amount (size/oz): 2-3 40 oz beers 1 - Frequency: daily 1 - Duration: on and off for 14 years, 1 month since last detox 1 - Last Use / Amount: 03/19/12 Substance #2 Name of Substance 2: cocaine 2 - Amount (size/oz): varries 2 - Frequency: 2 times monthly 2 - Last Use / Amount: 03/19/12  CIWA: CIWA-Ar BP: 129/90 mmHg Pulse Rate: 67  Nausea and Vomiting: no nausea and no vomiting Tactile Disturbances: none Tremor: no tremor Auditory Disturbances: not present Paroxysmal Sweats: no sweat visible Visual Disturbances: not present Anxiety: no anxiety, at ease Headache, Fullness in Head: none present Agitation: normal activity Orientation and Clouding of Sensorium: oriented and can do serial additions CIWA-Ar Total: 0  COWS:    Allergies:  Allergies  Allergen Reactions  . Penicillins Hives  . Chocolate Hives  . Orange Hives    Home Medications:  (Not in a [facility] admission)  OB/GYN Status:  No LMP recorded. Patient is postmenopausal.  General Assessment Data Location of Assessment: [facility] Living Arrangements: Spouse/significant other Can pt return to current living arrangement?: Yes Admission Status: Voluntary Is patient capable of signing voluntary admission?: Yes Transfer from: Acute [facility] Referral Source: Self/Family/Friend  Education Status Is patient currently in school?: No Highest grade of school patient has completed: graduated high school  Risk to self Suicidal Ideation: Yes-Currently Present Suicidal Intent: Yes-Currently Present Is patient at risk for suicide?: Yes Suicidal Plan?: Yes-Currently Present Specify Current Suicidal Plan: plan to overdose Access to Means: No What has been your use of drugs/alcohol within the last 12 months?: alcohol, cocaine Previous Attempts/Gestures: Yes Other Self Harm Risks: none Triggers for Past Attempts: Family contact;Other personal contacts Intentional Self Injurious Behavior: None Family Suicide  History: No Recent stressful life event(s): Other (Comment) (had a biopsy yesterday and is concerned about the results) Persecutory voices/beliefs?: No Depression: Yes Depression Symptoms: Despondent;Isolating Substance abuse history and/or treatment for substance abuse?: Yes Suicide prevention information given to non-admitted patients: Not applicable  Risk to Others Homicidal Ideation: Yes-Currently Present Thoughts of Harm to Others: No Current Homicidal Intent: No Current Homicidal Plan: No Access to Homicidal Means: No Identified Victim: states here friend that she lives with History of harm to others?: No Assessment of Violence: None Noted Violent Behavior Description: cooperative Does patient have access to weapons?: No Criminal Charges Pending?: No Does patient have a court date: No  Psychosis Hallucinations: Auditory;Visual Delusions: None noted  Mental Status Report Appear/Hygiene: Disheveled Eye Contact: Poor Motor Activity: Unremarkable Speech: Logical/coherent Level of Consciousness: Drowsy Mood: Depressed Affect: Blunted Anxiety Level: Minimal Thought Processes: Coherent;Relevant Judgement: Unimpaired Orientation: Place;Person;Time;Situation Obsessive Compulsive Thoughts/Behaviors: None  Cognitive Functioning Concentration: Normal Memory: Recent Intact;Remote Intact IQ: Average Insight: Poor Impulse Control: Poor Appetite: Fair Weight Loss: 0  Weight Gain: 0  Sleep: No Change Vegetative Symptoms: None  ADLScreening [facility]) Patient's cognitive ability adequate to safely complete daily activities?: Yes Patient able to express need for assistance with ADLs?: Yes Independently performs ADLs?: Yes (appropriate for developmental age)  Abuse/Neglect [facility]) Physical Abuse: Denies Verbal Abuse: Denies Sexual Abuse: Denies  Prior Inpatient Therapy Prior Inpatient Therapy: Yes Prior Therapy Dates: 2013 Prior Therapy  Facilty/Provider(s): [facility] Reason for Treatment: detox  Prior Outpatient Therapy Prior Outpatient Therapy: Yes Prior Therapy Dates: ongoing Prior Therapy [facility] Reason for Treatment: medication management  ADL Screening (condition at time of admission) Patient's cognitive ability adequate to safely complete daily activities?: Yes Patient able to express need for assistance with ADLs?: Yes Independently performs ADLs?: Yes (appropriate for developmental age) Weakness of Legs: None Weakness of Arms/Hands: None  Home Assistive Devices/Equipment Home Assistive Devices/Equipment: None    Abuse/Neglect Assessment (Assessment to be complete while patient is alone) Physical Abuse: Denies Verbal Abuse: Denies Sexual Abuse: Denies Exploitation of patient/patient's resources: Denies Self-Neglect: Denies     Merchant [employer] officer (For Healthcare) Advance Directive: Patient does not have advance directive;Patient would not like information Pre-existing out of facility DNR order (yellow form or pink MOST form): No Nutrition Screen- [facility] Patient's home diet: Regular Have you recently lost weight without trying?: No Have you been eating poorly because of a decreased appetite?: No Malnutrition Screening Tool Score: 0   Additional Information 1:1 In Past 12 Months?: No CIRT Risk: No Elopement Risk: No Does patient have medical clearance?: Yes     Disposition:  Disposition Disposition of Patient: Other dispositions (pending telepsych) Type of inpatient treatment program: Adult  On Site Evaluation by:   Reviewed with Physician:     Marjean Donna 03/20/2012 6:43 AM

## 2012-03-20 NOTE — ED Notes (Signed)
Eating breakfast 

## 2012-03-20 NOTE — BHH Counselor (Signed)
Discussed treatment plan with Dr. Effie Shy. He felt as if patient was ok to discharge based on his conversation with patient this am. Dr. Effie Shy would like patient to be discharged with the appropriate out-pt referrals. Writer provided patient with several community referrals to both mental health and substance abuse treatment facilities. Patient agreed to follow up accordingly.

## 2012-03-20 NOTE — ED Notes (Signed)
Up to the desk-trying to find ride

## 2012-03-20 NOTE — ED Provider Notes (Signed)
On evaluation, now. The patient is sleepy, but arousable and communicative. She states that she wants detoxification from pain and alcohol. Her alcohol on arrival yesterday was 50. She is not intoxicated by alcohol now. She's not exhibiting any tremor now. I will have assessment crisis team, see the patient and given outpatient referrals.  Flint Melter, MD 03/20/12 [phone]

## 2012-03-20 NOTE — ED Notes (Signed)
ACT in w/ pt 

## 2012-03-22 LAB — GC/CHLAMYDIA PROBE AMP, GENITAL: GC Probe Amp, Genital: POSITIVE — AB

## 2012-03-26 NOTE — ED Notes (Signed)
Chart returned from EDP office. Rx writtenby Felicie Morn  for Cefixime 400 mg po x 1 and Azithromycin 1 gram po x 1   Need to be called to pharmacy.

## 2012-03-27 ENCOUNTER — Telehealth (HOSPITAL_COMMUNITY): Payer: Self-pay | Admitting: Emergency Medicine

## 2012-03-31 ENCOUNTER — Ambulatory Visit (INDEPENDENT_AMBULATORY_CARE_PROVIDER_SITE_OTHER): Payer: PRIVATE HEALTH INSURANCE | Admitting: General Surgery

## 2012-04-01 ENCOUNTER — Ambulatory Visit (INDEPENDENT_AMBULATORY_CARE_PROVIDER_SITE_OTHER): Payer: PRIVATE HEALTH INSURANCE | Admitting: General Surgery

## 2012-04-01 ENCOUNTER — Encounter (INDEPENDENT_AMBULATORY_CARE_PROVIDER_SITE_OTHER): Payer: Self-pay | Admitting: General Surgery

## 2012-04-02 ENCOUNTER — Telehealth (INDEPENDENT_AMBULATORY_CARE_PROVIDER_SITE_OTHER): Payer: Self-pay | Admitting: General Surgery

## 2012-04-02 ENCOUNTER — Telehealth (INDEPENDENT_AMBULATORY_CARE_PROVIDER_SITE_OTHER): Payer: Self-pay

## 2012-04-02 NOTE — Telephone Encounter (Signed)
Spoke with pt and informed her that her next appt w/ Dr. Carolynne Edouard will be on 10/28 at 8:30

## 2012-04-02 NOTE — Telephone Encounter (Signed)
Pt missed her appt yesterday with Dr. Carolynne Edouard.  She would like to reschedule.

## 2012-04-05 NOTE — Patient Instructions (Signed)
Detailed notes scanned into EPIC under media. 

## 2012-04-05 NOTE — Progress Notes (Signed)
Detailed notes scanned into EPIC under media.

## 2012-04-12 ENCOUNTER — Ambulatory Visit (INDEPENDENT_AMBULATORY_CARE_PROVIDER_SITE_OTHER): Payer: PRIVATE HEALTH INSURANCE | Admitting: General Surgery

## 2012-04-12 ENCOUNTER — Other Ambulatory Visit (INDEPENDENT_AMBULATORY_CARE_PROVIDER_SITE_OTHER): Payer: Self-pay | Admitting: General Surgery

## 2012-04-12 ENCOUNTER — Encounter (INDEPENDENT_AMBULATORY_CARE_PROVIDER_SITE_OTHER): Payer: Self-pay | Admitting: General Surgery

## 2012-04-12 VITALS — BP 146/82 | HR 72 | Temp 97.1°F | Resp 16 | Ht 64.0 in | Wt 185.6 lb

## 2012-04-12 DIAGNOSIS — N6099 Unspecified benign mammary dysplasia of unspecified breast: Secondary | ICD-10-CM

## 2012-04-12 DIAGNOSIS — N6089 Other benign mammary dysplasias of unspecified breast: Secondary | ICD-10-CM

## 2012-04-12 NOTE — Progress Notes (Signed)
Subjective:     Patient ID: Maureen [facility], female   DOB: 08-Dec-1965, 46 y.o.   MRN: [id number]  HPI We're asked to see the patient in consultation by Dr. Christoper Allegra to evaluate her for calcifications in the right breast. The patient is a 46 year old black female who recently went for a routine screening mammogram. At that time she was found to have some abnormal calcifications in the upper outer right breast. She has been having breast pain mostly in the right breast. She states that the pain tends to come and go but occurs almost on a daily basis. She denies any discharge from her nipple. The calcifications were biopsied and the pathology showed atypical ductal hyperplasia.  Review of Systems  Constitutional: Negative.   HENT: Negative.   Eyes: Negative.   Respiratory: Negative.   Cardiovascular: Negative.   Gastrointestinal: Negative.   Genitourinary: Negative.   Musculoskeletal: Negative.   Skin: Negative.   Neurological: Negative.   Hematological: Negative.   Psychiatric/Behavioral: Negative.        Objective:   Physical Exam  Constitutional: She is oriented to person, place, and time. She appears well-developed and well-nourished.  HENT:  Head: Normocephalic and atraumatic.  Eyes: Conjunctivae normal and EOM are normal. Pupils are equal, round, and reactive to light.  Neck: Normal range of motion. Neck supple.  Cardiovascular: Normal rate, regular rhythm and normal heart sounds.   Pulmonary/Chest: Effort normal and breath sounds normal.       There is no palpable mass in either breast. There is no palpable axillary supraclavicular or cervical lymphadenopathy.  Abdominal: Soft. Bowel sounds are normal. She exhibits no mass. There is no tenderness.  Musculoskeletal: Normal range of motion.  Lymphadenopathy:    She has no cervical adenopathy.  Neurological: She is alert and oriented to person, place, and time.  Skin: Skin is warm and dry.  Psychiatric: She has a normal mood and  affect. Her behavior is normal.       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 This is considered a high risk lesion. Because this lesion can appear similar to DCIS my recommendation would be to have this area excised. Have discussed with her in detail the risks and benefits of the operation to this as well as some of the technical aspects and she understands and wishes to proceed.    Plan:     Plan for wire localized lumpectomy of the upper outer right breast.

## 2012-04-12 NOTE — Patient Instructions (Signed)
Plan for right breast wire localized lumpectomy 

## 2012-04-13 ENCOUNTER — Encounter (HOSPITAL_BASED_OUTPATIENT_CLINIC_OR_DEPARTMENT_OTHER): Payer: Self-pay | Admitting: *Deleted

## 2012-04-13 NOTE — Pre-Procedure Instructions (Addendum)
To come for CMET 

## 2012-04-19 ENCOUNTER — Ambulatory Visit
Admission: RE | Admit: 2012-04-19 | Discharge: 2012-04-19 | Disposition: A | Discharge status: Home / Self Care | Payer: No Typology Code available for payment source | Source: Ambulatory Visit | Attending: General Surgery | Admitting: General Surgery

## 2012-04-19 ENCOUNTER — Ambulatory Visit (HOSPITAL_BASED_OUTPATIENT_CLINIC_OR_DEPARTMENT_OTHER): Payer: Self-pay | Admitting: Certified Registered Nurse Anesthetist

## 2012-04-19 ENCOUNTER — Encounter (HOSPITAL_BASED_OUTPATIENT_CLINIC_OR_DEPARTMENT_OTHER): Payer: Self-pay | Admitting: Certified Registered Nurse Anesthetist

## 2012-04-19 ENCOUNTER — Encounter (HOSPITAL_BASED_OUTPATIENT_CLINIC_OR_DEPARTMENT_OTHER)
Admission: RE | Disposition: A | Discharge status: Home / Self Care | Payer: Self-pay | Source: Ambulatory Visit | Attending: General Surgery

## 2012-04-19 ENCOUNTER — Encounter (HOSPITAL_BASED_OUTPATIENT_CLINIC_OR_DEPARTMENT_OTHER): Payer: Self-pay

## 2012-04-19 ENCOUNTER — Ambulatory Visit (HOSPITAL_BASED_OUTPATIENT_CLINIC_OR_DEPARTMENT_OTHER)
Admission: RE | Admit: 2012-04-19 | Discharge: 2012-04-19 | Disposition: A | Discharge status: Home / Self Care | Payer: Self-pay | Source: Ambulatory Visit | Attending: General Surgery | Admitting: General Surgery

## 2012-04-19 ENCOUNTER — Other Ambulatory Visit (INDEPENDENT_AMBULATORY_CARE_PROVIDER_SITE_OTHER): Payer: Self-pay | Admitting: General Surgery

## 2012-04-19 DIAGNOSIS — N6099 Unspecified benign mammary dysplasia of unspecified breast: Secondary | ICD-10-CM

## 2012-04-19 DIAGNOSIS — R920 Mammographic microcalcification found on diagnostic imaging of breast: Secondary | ICD-10-CM | POA: Insufficient documentation

## 2012-04-19 DIAGNOSIS — N6089 Other benign mammary dysplasias of unspecified breast: Secondary | ICD-10-CM

## 2012-04-19 DIAGNOSIS — I10 Essential (primary) hypertension: Secondary | ICD-10-CM | POA: Insufficient documentation

## 2012-04-19 DIAGNOSIS — Z01812 Encounter for preprocedural laboratory examination: Secondary | ICD-10-CM | POA: Insufficient documentation

## 2012-04-19 DIAGNOSIS — K219 Gastro-esophageal reflux disease without esophagitis: Secondary | ICD-10-CM | POA: Insufficient documentation

## 2012-04-19 HISTORY — DX: Gastric ulcer, unspecified as acute or chronic, without hemorrhage or perforation: K25.9

## 2012-04-19 HISTORY — DX: Unspecified benign mammary dysplasia of unspecified breast: N60.99

## 2012-04-19 HISTORY — DX: Headache: R51

## 2012-04-19 HISTORY — PX: BREAST LUMPECTOMY WITH NEEDLE LOCALIZATION: SHX5759

## 2012-04-19 HISTORY — DX: Unspecified osteoarthritis, unspecified site: M19.90

## 2012-04-19 HISTORY — DX: Unspecified temporomandibular joint disorder, unspecified side: M26.609

## 2012-04-19 LAB — POCT I-STAT, CHEM 8
BUN: 7 mg/dL (ref 6–23)
Calcium, Ion: 1.13 mmol/L (ref 1.12–1.23)
Chloride: 108 mEq/L (ref 96–112)
HCT: 35 % — ABNORMAL LOW (ref 36.0–46.0)
Potassium: 3.2 mEq/L — ABNORMAL LOW (ref 3.5–5.1)
Sodium: 142 mEq/L (ref 135–145)

## 2012-04-19 SURGERY — BREAST LUMPECTOMY WITH NEEDLE LOCALIZATION
Anesthesia: General | Site: Breast | Laterality: Right | Wound class: Clean

## 2012-04-19 MED ORDER — CHLORHEXIDINE GLUCONATE 4 % EX LIQD: 1.0000 "application " | Freq: Once | CUTANEOUS | Status: DC

## 2012-04-19 MED ORDER — OXYCODONE HCL 5 MG/5ML PO SOLN: 5.0000 mg | Freq: Once | ORAL | Status: AC | PRN

## 2012-04-19 MED ORDER — BUPIVACAINE-EPINEPHRINE 0.25% -1:200000 IJ SOLN
INTRAMUSCULAR | Status: DC | PRN
  Administered 2012-04-19: 20 mL

## 2012-04-19 MED ORDER — ONDANSETRON HCL 4 MG/2ML IJ SOLN: 4.0000 mg | Freq: Once | INTRAMUSCULAR | Status: DC | PRN

## 2012-04-19 MED ORDER — MIDAZOLAM HCL 5 MG/5ML IJ SOLN
INTRAMUSCULAR | Status: DC | PRN
  Administered 2012-04-19: 2 mg via INTRAVENOUS

## 2012-04-19 MED ORDER — HYDROMORPHONE HCL PF 1 MG/ML IJ SOLN
0.2500 mg | INTRAMUSCULAR | Status: DC | PRN
  Administered 2012-04-19: 0.25 mg via INTRAVENOUS

## 2012-04-19 MED ORDER — OXYCODONE HCL 5 MG PO TABS
5.0000 mg | ORAL_TABLET | Freq: Once | ORAL | Status: AC | PRN
  Administered 2012-04-19: 5 mg via ORAL

## 2012-04-19 MED ORDER — ONDANSETRON HCL 4 MG/2ML IJ SOLN
INTRAMUSCULAR | Status: DC | PRN
  Administered 2012-04-19: 4 mg via INTRAVENOUS

## 2012-04-19 MED ORDER — FENTANYL CITRATE 0.05 MG/ML IJ SOLN
INTRAMUSCULAR | Status: DC | PRN
  Administered 2012-04-19: 100 ug via INTRAVENOUS
  Administered 2012-04-19: 25 ug via INTRAVENOUS

## 2012-04-19 MED ORDER — METOPROLOL TARTRATE 50 MG PO TABS
50.0000 mg | ORAL_TABLET | Freq: Once | ORAL | Status: AC
  Administered 2012-04-19: 50 mg via ORAL

## 2012-04-19 MED ORDER — LIDOCAINE HCL (CARDIAC) 20 MG/ML IV SOLN
INTRAVENOUS | Status: DC | PRN
  Administered 2012-04-19: 60 mg via INTRAVENOUS

## 2012-04-19 MED ORDER — DEXAMETHASONE SODIUM PHOSPHATE 4 MG/ML IJ SOLN
INTRAMUSCULAR | Status: DC | PRN
  Administered 2012-04-19: 10 mg via INTRAVENOUS

## 2012-04-19 MED ORDER — PROPOFOL 10 MG/ML IV BOLUS
INTRAVENOUS | Status: DC | PRN
  Administered 2012-04-19: 200 mg via INTRAVENOUS

## 2012-04-19 MED ORDER — VANCOMYCIN HCL IN DEXTROSE 1-5 GM/200ML-% IV SOLN
1000.0000 mg | INTRAVENOUS | Status: AC
  Administered 2012-04-19: 1000 mg via INTRAVENOUS

## 2012-04-19 MED ORDER — LACTATED RINGERS IV SOLN
INTRAVENOUS | Status: DC
  Administered 2012-04-19 (×2): via INTRAVENOUS

## 2012-04-19 MED ORDER — HYDROMORPHONE HCL PF 1 MG/ML IJ SOLN: 0.5000 mg | INTRAMUSCULAR | Status: DC | PRN

## 2012-04-19 MED ORDER — HYDROCODONE-ACETAMINOPHEN 5-325 MG PO TABS: 1.0000 | ORAL_TABLET | Freq: Four times a day (QID) | ORAL | Status: DC | PRN

## 2012-04-19 SURGICAL SUPPLY — 42 items
BLADE SURG 10 STRL SS (BLADE) ×2 IMPLANT
BLADE SURG 15 STRL LF DISP TIS (BLADE) ×1 IMPLANT
BLADE SURG 15 STRL SS (BLADE) ×1
CANISTER SUCTION 1200CC (MISCELLANEOUS) ×2 IMPLANT
CHLORAPREP W/TINT 26ML (MISCELLANEOUS) ×2 IMPLANT
CLIP TI WIDE RED SMALL 6 (CLIP) ×2 IMPLANT
CLOTH BEACON ORANGE TIMEOUT ST (SAFETY) ×2 IMPLANT
COVER MAYO STAND STRL (DRAPES) ×2 IMPLANT
COVER TABLE BACK 60X90 (DRAPES) ×2 IMPLANT
DECANTER SPIKE VIAL GLASS SM (MISCELLANEOUS) ×2 IMPLANT
DERMABOND ADVANCED (GAUZE/BANDAGES/DRESSINGS) ×1
DERMABOND ADVANCED .7 DNX12 (GAUZE/BANDAGES/DRESSINGS) ×1 IMPLANT
DEVICE DUBIN W/COMP PLATE 8390 (MISCELLANEOUS) IMPLANT
DRAPE LAPAROSCOPIC ABDOMINAL (DRAPES) ×2 IMPLANT
DRAPE UTILITY XL STRL (DRAPES) ×2 IMPLANT
ELECT COATED BLADE 2.86 ST (ELECTRODE) ×2 IMPLANT
ELECT REM PT RETURN 9FT ADLT (ELECTROSURGICAL) ×2
ELECTRODE REM PT RTRN 9FT ADLT (ELECTROSURGICAL) ×1 IMPLANT
GLOVE BIO SURGEON STRL SZ7.5 (GLOVE) ×2 IMPLANT
GLOVE ECLIPSE 6.5 STRL STRAW (GLOVE) ×2 IMPLANT
GLOVE SKINSENSE NS SZ7.0 (GLOVE) ×1
GLOVE SKINSENSE STRL SZ7.0 (GLOVE) ×1 IMPLANT
GOWN PREVENTION PLUS XLARGE (GOWN DISPOSABLE) ×2 IMPLANT
NEEDLE HYPO 25X1 1.5 SAFETY (NEEDLE) ×2 IMPLANT
NS IRRIG 1000ML POUR BTL (IV SOLUTION) ×2 IMPLANT
PACK BASIN DAY SURGERY FS (CUSTOM PROCEDURE TRAY) ×2 IMPLANT
PENCIL BUTTON HOLSTER BLD 10FT (ELECTRODE) ×2 IMPLANT
SLEEVE SCD COMPRESS KNEE MED (MISCELLANEOUS) ×2 IMPLANT
SPONGE LAP 18X18 X RAY DECT (DISPOSABLE) ×2 IMPLANT
STAPLER VISISTAT 35W (STAPLE) IMPLANT
SUT MON AB 4-0 PC3 18 (SUTURE) ×2 IMPLANT
SUT SILK 2 0 SH (SUTURE) ×2 IMPLANT
SUT VIC AB 3-0 54X BRD REEL (SUTURE) IMPLANT
SUT VIC AB 3-0 BRD 54 (SUTURE)
SUT VIC AB 3-0 SH 27 (SUTURE) ×1
SUT VIC AB 3-0 SH 27X BRD (SUTURE) ×1 IMPLANT
SYR CONTROL 10ML LL (SYRINGE) ×2 IMPLANT
TOWEL OR 17X24 6PK STRL BLUE (TOWEL DISPOSABLE) ×2 IMPLANT
TOWEL OR NON WOVEN STRL DISP B (DISPOSABLE) ×2 IMPLANT
TUBE CONNECTING 20X1/4 (TUBING) ×2 IMPLANT
WATER STERILE IRR 1000ML POUR (IV SOLUTION) ×2 IMPLANT
YANKAUER SUCT BULB TIP NO VENT (SUCTIONS) ×2 IMPLANT

## 2012-04-19 NOTE — Anesthesia Postprocedure Evaluation (Signed)
  Anesthesia Post-op Note  Patient: Maureen Swanson  Procedure(s) Performed: Procedure(s) (LRB) with comments: BREAST LUMPECTOMY WITH NEEDLE LOCALIZATION (Right)  Patient Location: PACU  Anesthesia Type:General  Level of Consciousness: awake, alert  and oriented  Airway and Oxygen Therapy: Patient Spontanous Breathing and Patient connected to face mask oxygen  Post-op Pain: moderate  Post-op Assessment: Post-op Vital signs reviewed  Post-op Vital Signs: Reviewed  Complications: No apparent anesthesia complications

## 2012-04-19 NOTE — Anesthesia Preprocedure Evaluation (Addendum)
Anesthesia Evaluation  Patient identified by MRN, date of birth, ID band Patient awake    Reviewed: Allergy & Precautions, H&P , NPO status , Patient's Chart, lab work & pertinent test results, reviewed documented beta blocker date and time   Airway Mallampati: I TM Distance: >3 FB Neck ROM: Full  Mouth opening: Limited Mouth Opening  Dental  (+) Teeth Intact and Dental Advisory Given   Pulmonary  breath sounds clear to auscultation        Cardiovascular hypertension, Pt. on medications and Pt. on home beta blockers Rhythm:Regular Rate:Normal     Neuro/Psych    GI/Hepatic GERD-  Medicated and Controlled,  Endo/Other  Diabetes: "Borderline", on no meds.  Renal/GU      Musculoskeletal   Abdominal   Peds  Hematology   Anesthesia Other Findings   Reproductive/Obstetrics                           Anesthesia Physical Anesthesia Plan  ASA: III  Anesthesia Plan: General   Post-op Pain Management:    Induction: Intravenous  Airway Management Planned: LMA  Additional Equipment:   Intra-op Plan:   Post-operative Plan: Extubation in OR  Informed Consent: I have reviewed the patients History and Physical, chart, labs and discussed the procedure including the risks, benefits and alternatives for the proposed anesthesia with the patient or authorized representative who has indicated his/her understanding and acceptance.   Dental advisory given  Plan Discussed with: CRNA, Anesthesiologist and Surgeon  Anesthesia Plan Comments:         Anesthesia Quick Evaluation

## 2012-04-19 NOTE — Op Note (Signed)
04/19/2012  3:10 PM  PATIENT:  Maureen Swanson  46 y.o. female  PRE-OPERATIVE DIAGNOSIS:  right breast  ADH  POST-OPERATIVE DIAGNOSIS:  * No post-op diagnosis entered *  PROCEDURE:  Procedure(s) (LRB) with comments: BREAST LUMPECTOMY WITH NEEDLE LOCALIZATION (Right)  SURGEON:  Surgeon(s) and Role:    * Robyne Askew, MD - Primary  PHYSICIAN ASSISTANT:   ASSISTANTS: none   ANESTHESIA:   general  EBL:  Total I/O In: 1000 [I.V.:1000] Out: -   BLOOD ADMINISTERED:none  DRAINS: none   LOCAL MEDICATIONS USED:  MARCAINE     SPECIMEN:  Source of Specimen:  right breast tissue  DISPOSITION OF SPECIMEN:  PATHOLOGY  COUNTS:  YES  TOURNIQUET:  * No tourniquets in log *  DICTATION: .Dragon Dictation After informed consent was obtained the patient was brought to the operating room and placed in the supine position on the operating table. After adequate induction of general anesthesia the patient's right breast was prepped with ChloraPrep, allowed to dry, and draped in usual sterile manner. Earlier in the day the patient underwent a wire localization procedure and there were 2 wires in the lateral right breast. An elliptical incision was made with a 15 blade knife around the 2 wires. The incision was carried through the skin and subcutaneous tissue sharply with the electrocautery. Once into the breast tissue the path of the wires could be palpated. A circular portion of breast tissue was excised sharply around the wires. Once the specimen was removed it was oriented with a short stitch superior and long stitch lateral. A specimen radiograph showed the calcifications and clip to be in the center the specimen. Specimens and sent to pathology for further evaluation. Hemostasis was achieved using the Bovie electrocautery. The wound was infiltrated with quarter percent Marcaine. The deep layer the wound was closed with interrupted 3-0 Vicryl stitches. Skin was closed with interrupted 4-0 Monocryl  subcuticular stitches. Dermabond dressings were applied. The patient tolerated the procedure well. At the end of the case all needle sponge and instrument counts were correct. The patient was then awakened and taken to recovery in stable condition.  PLAN OF CARE: Discharge to home after PACU  PATIENT DISPOSITION:  PACU - hemodynamically stable.   Delay start of Pharmacological VTE agent (>24hrs) due to surgical blood loss or risk of bleeding: not applicable

## 2012-04-19 NOTE — Transfer of Care (Signed)
Immediate Anesthesia Transfer of Care Note  Patient: Maureen Swanson  Procedure(s) Performed: Procedure(s) (LRB) with comments: BREAST LUMPECTOMY WITH NEEDLE LOCALIZATION (Right)  Patient Location: PACU  Anesthesia Type:General  Level of Consciousness: awake, alert , oriented and patient cooperative  Airway & Oxygen Therapy: Patient Spontanous Breathing and Patient connected to face mask oxygen  Post-op Assessment: Report given to PACU RN and Post -op Vital signs reviewed and stable  Post vital signs: Reviewed and stable  Complications: No apparent anesthesia complications

## 2012-04-19 NOTE — Progress Notes (Signed)
Pre op dose of lopressor given per dr Ivin Booty' order since pt did not take her dose this AM

## 2012-04-19 NOTE — H&P (View-Only) (Signed)
Subjective:     Patient ID: Maureen Swanson, female   DOB: 03/06/1966, 46 y.o.   MRN: [id number]  HPI We're asked to see the patient in consultation by Dr. Steel to evaluate her for calcifications in the right breast. The patient is a 46-year-old black female who recently went for a routine screening mammogram. At that time she was found to have some abnormal calcifications in the upper outer right breast. She has been having breast pain mostly in the right breast. She states that the pain tends to come and go but occurs almost on a daily basis. She denies any discharge from her nipple. The calcifications were biopsied and the pathology showed atypical ductal hyperplasia.  Review of Systems  Constitutional: Negative.   HENT: Negative.   Eyes: Negative.   Respiratory: Negative.   Cardiovascular: Negative.   Gastrointestinal: Negative.   Genitourinary: Negative.   Musculoskeletal: Negative.   Skin: Negative.   Neurological: Negative.   Hematological: Negative.   Psychiatric/Behavioral: Negative.        Objective:   Physical Exam  Constitutional: She is oriented to person, place, and time. She appears well-developed and well-nourished.  HENT:  Head: Normocephalic and atraumatic.  Eyes: Conjunctivae normal and EOM are normal. Pupils are equal, round, and reactive to light.  Neck: Normal range of motion. Neck supple.  Cardiovascular: Normal rate, regular rhythm and normal heart sounds.   Pulmonary/Chest: Effort normal and breath sounds normal.       There is no palpable mass in either breast. There is no palpable axillary supraclavicular or cervical lymphadenopathy.  Abdominal: Soft. Bowel sounds are normal. She exhibits no mass. There is no tenderness.  Musculoskeletal: Normal range of motion.  Lymphadenopathy:    She has no cervical adenopathy.  Neurological: She is alert and oriented to person, place, and time.  Skin: Skin is warm and dry.  Psychiatric: She has a normal mood and  affect. Her behavior is normal.       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 This is considered a high risk lesion. Because this lesion can appear similar to DCIS my recommendation would be to have this area excised. Have discussed with her in detail the risks and benefits of the operation to this as well as some of the technical aspects and she understands and wishes to proceed.    Plan:     Plan for wire localized lumpectomy of the upper outer right breast.      

## 2012-04-19 NOTE — Anesthesia Procedure Notes (Addendum)
Procedure Name: LMA Insertion Date/Time: 04/19/2012 2:27 PM Performed by: Burna Cash Pre-anesthesia Checklist: Patient identified, Emergency Drugs available, Suction available, Patient being monitored and Timeout performed Patient Re-evaluated:Patient Re-evaluated prior to inductionOxygen Delivery Method: Circle system utilized Intubation Type: IV induction Ventilation: Mask ventilation without difficulty LMA: LMA inserted LMA Size: 4.0 Number of attempts: 1 Dental Injury: Teeth and Oropharynx as per pre-operative assessment

## 2012-04-19 NOTE — Interval H&P Note (Signed)
History and Physical Interval Note:  04/19/2012 10:43 AM  Maureen Swanson  has presented today for surgery, with the diagnosis of right breast  ADH  The various methods of treatment have been discussed with the patient and family. After consideration of risks, benefits and other options for treatment, the patient has consented to  Procedure(s) (LRB) with comments: BREAST LUMPECTOMY WITH NEEDLE LOCALIZATION (Right) as a surgical intervention .  The patient's history has been reviewed, patient examined, no change in status, stable for surgery.  I have reviewed the patient's chart and labs.  Questions were answered to the patient's satisfaction.     TOTH III,PAUL S

## 2012-04-20 ENCOUNTER — Encounter (HOSPITAL_BASED_OUTPATIENT_CLINIC_OR_DEPARTMENT_OTHER): Payer: Self-pay | Admitting: General Surgery

## 2012-04-20 LAB — POCT HEMOGLOBIN-HEMACUE: Hemoglobin: 11.1 g/dL — ABNORMAL LOW (ref 12.0–15.0)

## 2012-04-22 ENCOUNTER — Other Ambulatory Visit: Payer: Self-pay

## 2012-04-22 ENCOUNTER — Emergency Department (HOSPITAL_COMMUNITY)
Admission: EM | Admit: 2012-04-22 | Discharge: 2012-04-26 | Disposition: A | Discharge status: Home / Self Care | Payer: Self-pay | Attending: Emergency Medicine | Admitting: Emergency Medicine

## 2012-04-22 ENCOUNTER — Telehealth (INDEPENDENT_AMBULATORY_CARE_PROVIDER_SITE_OTHER): Payer: Self-pay

## 2012-04-22 ENCOUNTER — Encounter (HOSPITAL_COMMUNITY): Payer: Self-pay | Admitting: *Deleted

## 2012-04-22 DIAGNOSIS — K219 Gastro-esophageal reflux disease without esophagitis: Secondary | ICD-10-CM | POA: Insufficient documentation

## 2012-04-22 DIAGNOSIS — R45850 Homicidal ideations: Secondary | ICD-10-CM

## 2012-04-22 DIAGNOSIS — F319 Bipolar disorder, unspecified: Secondary | ICD-10-CM | POA: Insufficient documentation

## 2012-04-22 DIAGNOSIS — Z79899 Other long term (current) drug therapy: Secondary | ICD-10-CM | POA: Insufficient documentation

## 2012-04-22 DIAGNOSIS — E119 Type 2 diabetes mellitus without complications: Secondary | ICD-10-CM | POA: Insufficient documentation

## 2012-04-22 DIAGNOSIS — J45909 Unspecified asthma, uncomplicated: Secondary | ICD-10-CM | POA: Insufficient documentation

## 2012-04-22 DIAGNOSIS — E7800 Pure hypercholesterolemia, unspecified: Secondary | ICD-10-CM | POA: Insufficient documentation

## 2012-04-22 DIAGNOSIS — F411 Generalized anxiety disorder: Secondary | ICD-10-CM | POA: Insufficient documentation

## 2012-04-22 DIAGNOSIS — F29 Unspecified psychosis not due to a substance or known physiological condition: Secondary | ICD-10-CM

## 2012-04-22 DIAGNOSIS — R197 Diarrhea, unspecified: Secondary | ICD-10-CM | POA: Insufficient documentation

## 2012-04-22 DIAGNOSIS — Z8719 Personal history of other diseases of the digestive system: Secondary | ICD-10-CM | POA: Insufficient documentation

## 2012-04-22 DIAGNOSIS — F329 Major depressive disorder, single episode, unspecified: Secondary | ICD-10-CM | POA: Insufficient documentation

## 2012-04-22 DIAGNOSIS — F1410 Cocaine abuse, uncomplicated: Secondary | ICD-10-CM | POA: Insufficient documentation

## 2012-04-22 DIAGNOSIS — R109 Unspecified abdominal pain: Secondary | ICD-10-CM | POA: Insufficient documentation

## 2012-04-22 DIAGNOSIS — Z8739 Personal history of other diseases of the musculoskeletal system and connective tissue: Secondary | ICD-10-CM | POA: Insufficient documentation

## 2012-04-22 DIAGNOSIS — I10 Essential (primary) hypertension: Secondary | ICD-10-CM | POA: Insufficient documentation

## 2012-04-22 DIAGNOSIS — R350 Frequency of micturition: Secondary | ICD-10-CM | POA: Insufficient documentation

## 2012-04-22 DIAGNOSIS — R300 Dysuria: Secondary | ICD-10-CM | POA: Insufficient documentation

## 2012-04-22 DIAGNOSIS — Z791 Long term (current) use of non-steroidal anti-inflammatories (NSAID): Secondary | ICD-10-CM | POA: Insufficient documentation

## 2012-04-22 DIAGNOSIS — Z87448 Personal history of other diseases of urinary system: Secondary | ICD-10-CM | POA: Insufficient documentation

## 2012-04-22 DIAGNOSIS — R509 Fever, unspecified: Secondary | ICD-10-CM | POA: Insufficient documentation

## 2012-04-22 DIAGNOSIS — F3289 Other specified depressive episodes: Secondary | ICD-10-CM | POA: Insufficient documentation

## 2012-04-22 DIAGNOSIS — R110 Nausea: Secondary | ICD-10-CM | POA: Insufficient documentation

## 2012-04-22 LAB — COMPREHENSIVE METABOLIC PANEL
AST: 21 U/L (ref 0–37)
Albumin: 3.7 g/dL (ref 3.5–5.2)
BUN: 13 mg/dL (ref 6–23)
Calcium: 9.2 mg/dL (ref 8.4–10.5)
Chloride: 102 mEq/L (ref 96–112)
Creatinine, Ser: 1.05 mg/dL (ref 0.50–1.10)
GFR calc non Af Amer: 63 mL/min — ABNORMAL LOW (ref >90)
Total Bilirubin: 0.4 mg/dL (ref 0.3–1.2)

## 2012-04-22 LAB — CBC WITH DIFFERENTIAL/PLATELET
Basophils Absolute: 0 10*3/uL (ref 0.0–0.1)
Basophils Relative: 0 % (ref 0–1)
Eosinophils Relative: 0 % (ref 0–5)
Monocytes Absolute: 0.5 10*3/uL (ref 0.1–1.0)
Monocytes Relative: 6 % (ref 3–12)
Neutrophils Relative %: 77 % (ref 43–77)
Platelets: 212 10*3/uL (ref 150–400)
RBC: 3.72 MIL/uL — ABNORMAL LOW (ref 3.87–5.11)
WBC: 7.7 10*3/uL (ref 4.0–10.5)

## 2012-04-22 LAB — LIPASE, BLOOD: Lipase: 27 U/L (ref 11–59)

## 2012-04-22 MED ORDER — ONDANSETRON HCL 4 MG/2ML IJ SOLN
4.0000 mg | Freq: Once | INTRAMUSCULAR | Status: AC
  Administered 2012-04-22: 4 mg via INTRAVENOUS
  Filled 2012-04-22: qty 2

## 2012-04-22 MED ORDER — IBUPROFEN 200 MG PO TABS
400.0000 mg | ORAL_TABLET | Freq: Once | ORAL | Status: AC
  Administered 2012-04-22: 400 mg via ORAL
  Filled 2012-04-22: qty 2

## 2012-04-22 NOTE — ED Notes (Signed)
EKG completed and signed by Dr. Rosalia Hammers.

## 2012-04-22 NOTE — Telephone Encounter (Signed)
Pt called wanting to know how long she could expect to be sore from breast surgery. No redness. Minimal swelling.No bleeding. Pt advised soreness is expected after breast surgery and may last for 1 to 2 weeks. Pt to call if redness,increased swelling,bruising or bleeding occur. Pt states she understands.

## 2012-04-22 NOTE — ED Notes (Signed)
Per EMS: was at [facility] today, states she has been off normal medications x 3 weeks, usually drinks 2 40 oz beers a day and smokes crack.  Orthostatic tonight with EMS.  C/o abdominal pain and nausea.

## 2012-04-23 ENCOUNTER — Encounter (HOSPITAL_COMMUNITY): Payer: Self-pay | Admitting: Radiology

## 2012-04-23 ENCOUNTER — Emergency Department (HOSPITAL_COMMUNITY): Payer: Self-pay

## 2012-04-23 LAB — URINALYSIS, ROUTINE W REFLEX MICROSCOPIC
Glucose, UA: NEGATIVE mg/dL
Ketones, ur: NEGATIVE mg/dL
Leukocytes, UA: NEGATIVE
Nitrite: NEGATIVE
Protein, ur: NEGATIVE mg/dL

## 2012-04-23 LAB — ETHANOL: Alcohol, Ethyl (B): <11 mg/dL (ref 0–11)

## 2012-04-23 LAB — WET PREP, GENITAL
Trich, Wet Prep: NONE SEEN
Yeast Wet Prep HPF POC: NONE SEEN

## 2012-04-23 LAB — RAPID URINE DRUG SCREEN, HOSP PERFORMED: Opiates: POSITIVE — AB

## 2012-04-23 LAB — PREGNANCY, URINE: Preg Test, Ur: NEGATIVE

## 2012-04-23 MED ORDER — ALBUTEROL SULFATE HFA 108 (90 BASE) MCG/ACT IN AERS: 2.0000 | INHALATION_SPRAY | Freq: Four times a day (QID) | RESPIRATORY_TRACT | Status: DC | PRN

## 2012-04-23 MED ORDER — POTASSIUM CHLORIDE CRYS ER 20 MEQ PO TBCR
40.0000 meq | EXTENDED_RELEASE_TABLET | Freq: Once | ORAL | Status: AC
  Administered 2012-04-23: 40 meq via ORAL
  Filled 2012-04-23: qty 2

## 2012-04-23 MED ORDER — ONDANSETRON HCL 8 MG PO TABS: 4.0000 mg | ORAL_TABLET | Freq: Three times a day (TID) | ORAL | Status: DC | PRN

## 2012-04-23 MED ORDER — PANTOPRAZOLE SODIUM 40 MG PO TBEC
40.0000 mg | DELAYED_RELEASE_TABLET | Freq: Every day | ORAL | Status: DC
  Administered 2012-04-23 – 2012-04-25 (×3): 40 mg via ORAL
  Filled 2012-04-23 (×3): qty 1

## 2012-04-23 MED ORDER — RISPERIDONE 0.5 MG PO TABS
4.0000 mg | ORAL_TABLET | Freq: Every day | ORAL | Status: DC
  Administered 2012-04-23 – 2012-04-25 (×3): 4 mg via ORAL
  Filled 2012-04-23 (×3): qty 8

## 2012-04-23 MED ORDER — ATENOLOL 25 MG PO TABS
50.0000 mg | ORAL_TABLET | Freq: Every day | ORAL | Status: DC
  Administered 2012-04-23 – 2012-04-26 (×4): 50 mg via ORAL
  Filled 2012-04-23 (×3): qty 1
  Filled 2012-04-23 (×3): qty 2

## 2012-04-23 MED ORDER — ONDANSETRON HCL 4 MG/2ML IJ SOLN
4.0000 mg | Freq: Once | INTRAMUSCULAR | Status: AC
  Administered 2012-04-23: 4 mg via INTRAVENOUS

## 2012-04-23 MED ORDER — HYDROCHLOROTHIAZIDE 25 MG PO TABS
25.0000 mg | ORAL_TABLET | Freq: Every day | ORAL | Status: DC
  Administered 2012-04-23: 25 mg via ORAL
  Filled 2012-04-23: qty 1

## 2012-04-23 MED ORDER — ACETAMINOPHEN 325 MG PO TABS: 650.0000 mg | ORAL_TABLET | ORAL | Status: DC | PRN

## 2012-04-23 MED ORDER — ADULT MULTIVITAMIN W/MINERALS CH
1.0000 | ORAL_TABLET | Freq: Every day | ORAL | Status: DC
  Administered 2012-04-23 – 2012-04-26 (×4): 1 via ORAL
  Filled 2012-04-23 (×4): qty 1

## 2012-04-23 MED ORDER — IBUPROFEN 200 MG PO TABS
400.0000 mg | ORAL_TABLET | Freq: Once | ORAL | Status: AC
  Administered 2012-04-23: 400 mg via ORAL
  Filled 2012-04-23: qty 2

## 2012-04-23 MED ORDER — LAMOTRIGINE 25 MG PO TABS
25.0000 mg | ORAL_TABLET | Freq: Every day | ORAL | Status: DC
  Administered 2012-04-23 – 2012-04-25 (×3): 25 mg via ORAL
  Filled 2012-04-23 (×5): qty 1

## 2012-04-23 MED ORDER — LORAZEPAM 1 MG PO TABS
1.0000 mg | ORAL_TABLET | Freq: Three times a day (TID) | ORAL | Status: DC | PRN
  Administered 2012-04-23: 1 mg via ORAL
  Filled 2012-04-23: qty 1

## 2012-04-23 MED ORDER — ONDANSETRON HCL 4 MG/2ML IJ SOLN
INTRAMUSCULAR | Status: AC
  Filled 2012-04-23: qty 2

## 2012-04-23 MED ORDER — BUPROPION HCL ER (SR) 150 MG PO TB12
150.0000 mg | ORAL_TABLET | ORAL | Status: DC
  Administered 2012-04-23 – 2012-04-26 (×8): 150 mg via ORAL
  Filled 2012-04-23 (×11): qty 1

## 2012-04-23 MED ORDER — IOHEXOL 300 MG/ML  SOLN
100.0000 mL | Freq: Once | INTRAMUSCULAR | Status: AC | PRN
  Administered 2012-04-23: 100 mL via INTRAVENOUS

## 2012-04-23 MED ORDER — MOMETASONE FURO-FORMOTEROL FUM 100-5 MCG/ACT IN AERO
2.0000 | INHALATION_SPRAY | Freq: Two times a day (BID) | RESPIRATORY_TRACT | Status: DC
  Administered 2012-04-23 – 2012-04-26 (×6): 2 via RESPIRATORY_TRACT
  Filled 2012-04-23 (×2): qty 8.8

## 2012-04-23 MED ORDER — CITALOPRAM HYDROBROMIDE 10 MG PO TABS
40.0000 mg | ORAL_TABLET | Freq: Every day | ORAL | Status: DC
  Administered 2012-04-23 – 2012-04-26 (×4): 40 mg via ORAL
  Filled 2012-04-23: qty 4
  Filled 2012-04-23: qty 1
  Filled 2012-04-23 (×2): qty 4
  Filled 2012-04-23: qty 3

## 2012-04-23 MED ORDER — LORAZEPAM 2 MG/ML IJ SOLN
1.0000 mg | Freq: Once | INTRAMUSCULAR | Status: AC
  Administered 2012-04-23: 1 mg via INTRAVENOUS
  Filled 2012-04-23: qty 1

## 2012-04-23 MED ORDER — CLONIDINE HCL 0.1 MG/24HR TD PTWK
0.1000 mg | MEDICATED_PATCH | TRANSDERMAL | Status: DC
  Administered 2012-04-23: 0.1 mg via TRANSDERMAL
  Filled 2012-04-23 (×2): qty 1

## 2012-04-23 MED ORDER — POTASSIUM CHLORIDE CRYS ER 20 MEQ PO TBCR
20.0000 meq | EXTENDED_RELEASE_TABLET | Freq: Every day | ORAL | Status: DC
  Administered 2012-04-23 – 2012-04-26 (×4): 20 meq via ORAL
  Filled 2012-04-23 (×4): qty 1

## 2012-04-23 MED ORDER — POTASSIUM CHLORIDE 10 MEQ/100ML IV SOLN
10.0000 meq | Freq: Once | INTRAVENOUS | Status: AC
  Administered 2012-04-23: 10 meq via INTRAVENOUS
  Filled 2012-04-23: qty 100

## 2012-04-23 NOTE — ED Notes (Signed)
Admits to SI/HI, see CIWA score/scale, AC & CN made aware, security paged to wand pt, sitter placed with pt, pt up to b/r and back to bed w/o difficulty, IVF & KCL run infusing, swallowed PO KCL w/o difficulty. Still reports HA, abd pain & nausea, rates 9/10.

## 2012-04-23 NOTE — ED Notes (Signed)
C/o nausea, HA, stomach ache, itching. Tremors noted in upper and lower extremeties. Alert, NAD, calm, pleasant, cooperative, skin W&D, resps e/u.

## 2012-04-23 NOTE — ED Notes (Signed)
Report given to [facility] in [place] [facility]

## 2012-04-23 NOTE — ED Provider Notes (Signed)
Complains of left-sided abdominal pain for one week. Improved with getting up and walking worse with lying supine presently pain is minimal last bowel movement 2 days ago normal. Treated with ibuprofen with relief on exam alert in no distress lungs clear auscultation heart regular rate and rhythm abdomen obese tender at left upper quadrant and left lower quadrants no guarding rigidity or rebound   Doug Sou, MD 04/23/12 1715

## 2012-04-23 NOTE — ED Provider Notes (Signed)
Medical screening examination/treatment/procedure(s) were performed by non-physician practitioner and as supervising physician I was immediately available for consultation/collaboration.   Glynn Octave, MD 04/23/12 1710
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
## 2012-04-23 NOTE — ED Provider Notes (Signed)
Medical screening examination/treatment/procedure(s) were conducted as a shared visit with non-physician practitioner(s) and myself.  I personally evaluated the patient during the encounter Pt w hx depression, bipolar disorder, presents w increasing depression, thoughts of self harm, denies attempt to harm self or specific plan. Also notes episodic etoh abuse and crack abuse, denies any in past week. Had intermittent left abd pain earlier which has completely resolved. Alert. Depressed mood, flat affect. abd soft nt. telepsych eval/act eval pending. Signed out to uncoming provider to f/u with telepsych eval, recheck pt, and dispo appropriately.      Suzi Roots, MD 04/23/12 [phone]

## 2012-04-23 NOTE — ED Notes (Signed)
Pt c/o abdominal pain, had this morning then went away after ibuprofen. Pt ate lunch, asking for crackers and peanut butter. No n/v present. EDP made aware of this pain.
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
## 2012-04-23 NOTE — ED Notes (Signed)
EDPA into room 

## 2012-04-23 NOTE — ED Notes (Signed)
Called CT to notify finished contrast 

## 2012-04-23 NOTE — ED Provider Notes (Signed)
Medical screening examination/treatment/procedure(s) were performed by non-physician practitioner and as supervising physician I was immediately available for consultation/collaboration.  Olivia Mackie, MD 04/23/12 2125

## 2012-04-23 NOTE — ED Notes (Signed)
Tele-psych at bedside.

## 2012-04-23 NOTE — ED Notes (Signed)
PA at bedside.

## 2012-04-23 NOTE — ED Provider Notes (Signed)
4:39 PM BP 122/82  Pulse 69  Temp 98.7 F (37.1 C) (Oral)  Resp 18  SpO2 99% Patient c/o abdominal pain. LLQ.  She states that Pain is 7/10 , constant  But fluctuates in intensity. She had 5 days of constipation.  She had 1 small bowel movement that was hard stool balls a few 2 days ago and has had no BMs since. Patient denies history of diverticulitis.  Nausea, vomiting, diarrhea. No urinary sxs or flank pain.  Menopausal. HX of fibroids, no bleeding or pelvic pain.    Denies fevers, chills, myalgias, arthralgias. Denies DOE, SOB, chest tightness or pressure, radiation to left arm, jaw or back, or diaphoresis. Denies dysuria, flank pain, suprapubic pain, frequency, urgency, or hematuria. Denies headaches, light headedness, weakness, visual disturbances. Denies abdominal pain, nausea, vomiting, diarrhea or constipation.  Physical Exam  Constitutional: She is oriented to person, place, and time. She appears well-developed and well-nourished. No distress. Speech impediment and flat affect.  Eyes: Conjunctivae normal and EOM are normal.Neck: Normal range of motion.  Cardiovascular: Normal rate, regular rhythm and normal heart sounds.  Exam reveals no gallop and no friction rub.   No murmur heard. Pulmonary/Chest: Effort normal and breath sounds normal. No respiratory distress.  Abdominal: Soft. Bowel sounds are normal. She exhibits no distension and no mass. Tenderness to deep palpation of the LLQ. There is no guarding. No Peritoneal signs.   I do not suspect any clinically significant intra-abdominal pathology based on history and exam. I do suspect constipation as origin of her sxs. I have discussed findings with Dr. Judieth Keens who has agreed to assume care of the patient.     Arthor Captain, PA-C 04/23/12 1657

## 2012-04-24 MED ORDER — HYDROCHLOROTHIAZIDE 25 MG PO TABS
50.0000 mg | ORAL_TABLET | Freq: Every day | ORAL | Status: DC
  Administered 2012-04-24 – 2012-04-26 (×3): 50 mg via ORAL
  Filled 2012-04-24: qty 1
  Filled 2012-04-24 (×3): qty 2

## 2012-04-24 NOTE — ED Provider Notes (Addendum)
Pt sleeping this am.   HTN noted.  Currently on catapress and HCTZ.  Will increase to 50 mg daily. Awaiting psych dispo  Celene Kras, MD 04/24/12 0734 [facility] will take her on Monday.  Will continue to check other venues.  Celene Kras, MD 04/24/12 1316

## 2012-04-24 NOTE — ED Notes (Signed)
Patient got a shower and AM care

## 2012-04-24 NOTE — ED Notes (Signed)
Lab in to get a repeat Potassium level

## 2012-04-24 NOTE — ED Notes (Signed)
Report given to Jamie, RN.

## 2012-04-25 NOTE — ED Notes (Signed)
Brought patient a Ginger Ale

## 2012-04-25 NOTE — ED Notes (Signed)
Patient is sleeping comfortably at this time 
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
## 2012-04-25 NOTE — ED Provider Notes (Signed)
The patient is awake, alert on the morning rounds.  She is eating breakfast, sitting upright.  She has been accepted to behavioral health, though a bed is not available until tomorrow. VSS  Per RN no overnight events / complaints  Gerhard Munch, MD 04/25/12 [phone]

## 2012-04-26 ENCOUNTER — Telehealth (INDEPENDENT_AMBULATORY_CARE_PROVIDER_SITE_OTHER): Payer: Self-pay | Admitting: General Surgery

## 2012-04-26 NOTE — ED Notes (Signed)
Patient resting while watching tv with NAD. Sitter at bedside.  

## 2012-04-26 NOTE — ED Notes (Signed)
First meeting with patient. Patient sitting up in bed eating breakfast with NAD. Sitter at bedside.

## 2012-04-26 NOTE — ED Notes (Signed)
Patient up to restroom and sitter at her side.

## 2012-04-26 NOTE — ED Notes (Signed)
Patient watching tv with NAD while waiting for lunch.

## 2012-04-26 NOTE — ED Notes (Signed)
Patient resting with NAD and sitter at bedside.

## 2012-04-26 NOTE — Telephone Encounter (Signed)
Spoke with pt and informed her that her surgical pathology came back as benign.

## 2012-04-26 NOTE — BH Assessment (Signed)
Assessment Note  Update:  Pt received telepsych consult recommending pt be discharged with outpatient referrals.  Consulted with EDP Zackowski, who was in agreement with disposition as was pt.  Pt given referrals and is to be discharged.  Updated assessment disposition, completed assessment notification and faxed to [facility] to log.  Updated ED staff.     Disposition:  Disposition Disposition of Patient: Referred to;Outpatient treatment Type of inpatient treatment program: Adult Type of outpatient treatment: Adult Patient referred to: Outpatient clinic referral  On Site Evaluation by:   Reviewed with Physician:  Delorse Limber, Rennis Harding 04/26/2012 2:53 PM

## 2012-04-26 NOTE — Telephone Encounter (Signed)
Message copied by Littie Deeds on Mon Apr 26, 2012  4:41 PM ------      Message from: Caleen Essex III      Created: Mon Apr 26, 2012  2:43 PM       Path was benign

## 2012-04-26 NOTE — ED Notes (Signed)
Patient resting while watching tv and talking with the sitter at bedside with NAD.

## 2012-04-26 NOTE — ED Notes (Signed)
Patient given towels, soap, tooth brush, tooth paste and new scrubs for a shower. Sitter at patient side.

## 2012-04-26 NOTE — ED Notes (Signed)
Patient given belongings and bus pass and discharged home.

## 2012-05-04 ENCOUNTER — Encounter (INDEPENDENT_AMBULATORY_CARE_PROVIDER_SITE_OTHER): Payer: PRIVATE HEALTH INSURANCE | Admitting: General Surgery

## 2012-05-12 ENCOUNTER — Encounter (INDEPENDENT_AMBULATORY_CARE_PROVIDER_SITE_OTHER): Payer: Self-pay | Admitting: General Surgery

## 2012-05-24 ENCOUNTER — Encounter (INDEPENDENT_AMBULATORY_CARE_PROVIDER_SITE_OTHER): Payer: PRIVATE HEALTH INSURANCE | Admitting: General Surgery

## 2012-05-26 NOTE — Addendum Note (Signed)
Encounter addended by: Pat Elicker Poll Glenmore Karl, RN on: 05/26/2012  2:37 PM<BR>     Documentation filed: Charges VN

## 2012-06-21 ENCOUNTER — Encounter: Payer: Self-pay | Admitting: Obstetrics and Gynecology

## 2012-06-23 ENCOUNTER — Encounter (INDEPENDENT_AMBULATORY_CARE_PROVIDER_SITE_OTHER): Payer: PRIVATE HEALTH INSURANCE | Admitting: General Surgery

## 2012-07-27 ENCOUNTER — Encounter (INDEPENDENT_AMBULATORY_CARE_PROVIDER_SITE_OTHER): Payer: PRIVATE HEALTH INSURANCE | Admitting: General Surgery

## 2012-07-28 ENCOUNTER — Encounter (INDEPENDENT_AMBULATORY_CARE_PROVIDER_SITE_OTHER): Payer: Self-pay | Admitting: General Surgery

## 2012-08-14 ENCOUNTER — Encounter (HOSPITAL_BASED_OUTPATIENT_CLINIC_OR_DEPARTMENT_OTHER): Payer: Self-pay

## 2012-08-14 ENCOUNTER — Emergency Department (HOSPITAL_BASED_OUTPATIENT_CLINIC_OR_DEPARTMENT_OTHER)
Admission: EM | Admit: 2012-08-14 | Discharge: 2012-08-14 | Disposition: A | Discharge status: Home / Self Care | Payer: Self-pay | Attending: Emergency Medicine | Admitting: Emergency Medicine

## 2012-08-14 DIAGNOSIS — F411 Generalized anxiety disorder: Secondary | ICD-10-CM | POA: Insufficient documentation

## 2012-08-14 DIAGNOSIS — Z8739 Personal history of other diseases of the musculoskeletal system and connective tissue: Secondary | ICD-10-CM | POA: Insufficient documentation

## 2012-08-14 DIAGNOSIS — Z8742 Personal history of other diseases of the female genital tract: Secondary | ICD-10-CM | POA: Insufficient documentation

## 2012-08-14 DIAGNOSIS — K219 Gastro-esophageal reflux disease without esophagitis: Secondary | ICD-10-CM | POA: Insufficient documentation

## 2012-08-14 DIAGNOSIS — J45909 Unspecified asthma, uncomplicated: Secondary | ICD-10-CM | POA: Insufficient documentation

## 2012-08-14 DIAGNOSIS — I10 Essential (primary) hypertension: Secondary | ICD-10-CM | POA: Insufficient documentation

## 2012-08-14 DIAGNOSIS — K089 Disorder of teeth and supporting structures, unspecified: Secondary | ICD-10-CM | POA: Insufficient documentation

## 2012-08-14 DIAGNOSIS — Z8711 Personal history of peptic ulcer disease: Secondary | ICD-10-CM | POA: Insufficient documentation

## 2012-08-14 DIAGNOSIS — Z8639 Personal history of other endocrine, nutritional and metabolic disease: Secondary | ICD-10-CM | POA: Insufficient documentation

## 2012-08-14 DIAGNOSIS — Z79899 Other long term (current) drug therapy: Secondary | ICD-10-CM | POA: Insufficient documentation

## 2012-08-14 DIAGNOSIS — Z87891 Personal history of nicotine dependence: Secondary | ICD-10-CM | POA: Insufficient documentation

## 2012-08-14 DIAGNOSIS — M26629 Arthralgia of temporomandibular joint, unspecified side: Secondary | ICD-10-CM

## 2012-08-14 DIAGNOSIS — IMO0002 Reserved for concepts with insufficient information to code with codable children: Secondary | ICD-10-CM | POA: Insufficient documentation

## 2012-08-14 DIAGNOSIS — F319 Bipolar disorder, unspecified: Secondary | ICD-10-CM | POA: Insufficient documentation

## 2012-08-14 DIAGNOSIS — Z862 Personal history of diseases of the blood and blood-forming organs and certain disorders involving the immune mechanism: Secondary | ICD-10-CM | POA: Insufficient documentation

## 2012-08-14 DIAGNOSIS — F1410 Cocaine abuse, uncomplicated: Secondary | ICD-10-CM | POA: Insufficient documentation

## 2012-08-14 DIAGNOSIS — M26609 Unspecified temporomandibular joint disorder, unspecified side: Secondary | ICD-10-CM | POA: Insufficient documentation

## 2012-08-14 DIAGNOSIS — E119 Type 2 diabetes mellitus without complications: Secondary | ICD-10-CM | POA: Insufficient documentation

## 2012-08-14 DIAGNOSIS — Z8679 Personal history of other diseases of the circulatory system: Secondary | ICD-10-CM | POA: Insufficient documentation

## 2012-08-14 MED ORDER — CLINDAMYCIN HCL 300 MG PO CAPS: 300.0000 mg | ORAL_CAPSULE | Freq: Four times a day (QID) | ORAL | Status: DC

## 2012-08-14 MED ORDER — IBUPROFEN 400 MG PO TABS: 400.0000 mg | ORAL_TABLET | Freq: Four times a day (QID) | ORAL | Status: DC | PRN

## 2012-08-14 NOTE — ED Provider Notes (Signed)
History     CSN: [id number]  Arrival date & time 08/14/12  0905   First MD Initiated Contact with Patient 08/14/12 [phone]      Chief Complaint  Patient presents with  . Dental Pain    (Consider location/radiation/quality/duration/timing/severity/associated sxs/prior treatment) HPI This 47 year old female has chronic pain in her bilateral TMJ joints worse with chewing and she also has bruxism at night, she is instructed to follow up with a dentist and to consider an over-the-counter dental bite block at nighttime for that, she now presents with right mandibular gingival pain for a few days with no swelling no trauma but feels like a constant toothache 24 hours a day for a few days, she is currently in drug rehabilitation and has a history of alcoholism as well as drug abuse, there is no treatment prior to arrival, her pain is moderately severe, she is no fever no neck swelling no neck pain stridor no drooling no chest pain no shortness of breath and no other concerns. Past Medical History  Diagnosis Date  . High cholesterol   . Depression   . Gout   . Anxiety   . Bipolar 1 disorder   . GERD (gastroesophageal reflux disease)   . Substance abuse     crack cocaine  . Headache     migraines  . TMJ (temporomandibular joint disorder)   . Asthma     daily and prn inhalers  . Arthritis     back and knees  . Gastric ulcer   . 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 Hypertension     under control, has been on med. x 12 yrs.  . Diabetes mellitus     diet-controlled    Past Surgical History  Procedure Laterality Date  . Knee arthroscopy w/ partial medial meniscectomy  05/01/2010    right  . Breast lumpectomy with needle localization  04/19/2012    Procedure: BREAST LUMPECTOMY WITH NEEDLE LOCALIZATION;  Surgeon: Robyne Askew, MD;  Location: [facility];  Service: General;  Laterality: Right;    Family History  Problem Relation Age of Onset  .  Diabetes Mother   . Cancer Mother     breast  . Cancer Father     History  Substance Use Topics  . Smoking status: Former Smoker -- 15 years    Quit date: 08/02/2012  . Smokeless tobacco: Never Used     Comment: quit smoking 02/2012  . Alcohol Use: No     Comment: no longer drinking    OB History   Grav Para Term Preterm Abortions TAB SAB Ect Mult Living                  Review of Systems 10 Systems reviewed and are negative for acute change except as noted in the HPI. Allergies  Chocolate; Orange; and Penicillins  Home Medications   Current Outpatient Rx  Name  Route  Sig  Dispense  Refill  . atenolol (TENORMIN) 50 MG tablet   Oral   Take 100 mg by mouth daily. For blood pressure control.         Marland Kitchen buPROPion (WELLBUTRIN SR) 150 MG 12 hr tablet   Oral   Take 1 tablet (150 mg total) by mouth 2 (two) times daily at 8am and 2pm. For depression.   60 tablet   0   . cetirizine (ZYRTEC) 10 MG tablet   Oral   Take 10  mg by mouth daily.         . citalopram (CELEXA) 40 MG tablet   Oral   Take 1 tablet (40 mg total) by mouth daily. For depression and anxiety.   30 tablet   0   . DICLOFENAC PO   Oral   Take by mouth 2 (two) times daily as needed.         . hydrochlorothiazide (HYDRODIURIL) 25 MG tablet   Oral   Take 1 tablet (25 mg total) by mouth daily. For blood pressure control.   30 tablet   0   . lamoTRIgine (LAMICTAL) 25 MG tablet   Oral   Take 1 tablet (25 mg total) by mouth at bedtime. For mood stabilization.   1 tablet   30   . pantoprazole (PROTONIX) 40 MG tablet   Oral   Take 1 tablet (40 mg total) by mouth at bedtime. For Reflux.   30 tablet   0   . RisperiDONE (RISPERDAL PO)   Oral   Take by mouth at bedtime.         Marland Kitchen albuterol (PROVENTIL HFA;VENTOLIN HFA) 108 (90 BASE) MCG/ACT inhaler   Inhalation   Inhale 2 puffs into the lungs every 6 (six) hours as needed for wheezing or shortness of breath.   18 g   0   . clindamycin  (CLEOCIN) 300 MG capsule   Oral   Take 1 capsule (300 mg total) by mouth 4 (four) times daily. X 7 days   28 capsule   0   . cloNIDine (CATAPRES - DOSED IN MG/24 HR) 0.1 mg/24hr patch   Transdermal   Place 1 patch (0.1 mg total) onto the skin once a week. For blood pressure control.  Please replace patch on March 03, 2012.   4 patch   0   . Fluticasone-Salmeterol (ADVAIR) 250-50 MCG/DOSE AEPB   Inhalation   Inhale 1 puff into the lungs every 12 (twelve) hours. For asthma.   60 each   0   . HYDROcodone-acetaminophen (NORCO) 5-325 MG per tablet   Oral   Take 1-2 tablets by mouth every 6 (six) hours as needed for pain.   30 tablet   1   . ibuprofen (ADVIL,MOTRIN) 400 MG tablet   Oral   Take 1 tablet (400 mg total) by mouth every 6 (six) hours as needed for pain.   30 tablet   0   . loratadine (CLARITIN) 10 MG tablet   Oral   Take 1 tablet (10 mg total) by mouth daily. For seasonal allergies.   30 tablet   0   . Multiple Vitamin (MULTIVITAMIN WITH MINERALS) TABS   Oral   Take 1 tablet by mouth daily. For nutritional supplementation.   30 tablet   0   . naproxen (NAPROSYN) 250 MG tablet   Oral   Take 1 tablet (250 mg total) by mouth 2 (two) times daily with a meal. For pain.   60 tablet   0   . EXPIRED: risperidone (RISPERDAL) 4 MG tablet   Oral   Take 4 mg by mouth at bedtime. For psychosis.           BP 174/100  Pulse 65  Temp(Src) 98.9 F (37.2 C) (Oral)  Resp 17  Ht 5\' 4"  (1.626 m)  Wt 203 lb (92.08 kg)  BMI 34.83 kg/m2  SpO2 100%  Physical Exam  Nursing note and vitals reviewed. Constitutional:  Awake, alert, nontoxic appearance.  HENT:  Head: Atraumatic.  Mouth/Throat: Oropharynx is clear and moist. No oropharyngeal exudate.  Mild tenderness at the bilateral temporomandibular joints without trismus or jaw malocclusion, dentition is stable and nontender but she does have some mild right mandibular gingival tenderness without swelling  erythema or purulent drainage or fluctuance but I cannot rule out a dental infection  Eyes: Right eye exhibits no discharge. Left eye exhibits no discharge.  Neck: Neck supple.  Cardiovascular: Normal rate and regular rhythm.   No murmur heard. Pulmonary/Chest: Effort normal and breath sounds normal. No stridor. No respiratory distress. She has no wheezes. She has no rales. She exhibits no tenderness.  Abdominal: Soft. There is no tenderness. There is no rebound.  Musculoskeletal: She exhibits no tenderness.  Baseline ROM, no obvious new focal weakness.  Lymphadenopathy:    She has no cervical adenopathy.  Neurological: She is alert.  Mental status and motor strength appears baseline for patient and situation.  Skin: No rash noted.  Psychiatric: She has a normal mood and affect.    ED Course  Procedures (including critical care time)  Labs Reviewed - No data to display No results found.   1. Pain, dental   2. TMJ syndrome       MDM  Pt stable in ED with no significant deterioration in condition.  Patient / Family / Caregiver informed of clinical course, understand medical decision-making process, and agree with plan. I doubt any other EMC precluding discharge at this time including, but not necessarily limited to the following:deep space neck infection.        Hurman Horn, MD 08/14/12 2154

## 2012-08-14 NOTE — ED Notes (Signed)
Pt states that she has toothache in the R lower jaw, anterior.  Pt states that she has had treatment for toothache in this area in the past.  Pt is recovering from crack cocaine abuse at [facility].

## 2012-09-07 ENCOUNTER — Inpatient Hospital Stay (HOSPITAL_COMMUNITY)
Admission: AD | Admit: 2012-09-07 | Discharge: 2012-09-13 | DRG: 897 | Disposition: A | Discharge status: Home / Self Care | Payer: Federal, State, Local not specified - Other | Source: Intra-hospital | Attending: Psychiatry | Admitting: Psychiatry

## 2012-09-07 ENCOUNTER — Emergency Department (HOSPITAL_COMMUNITY)
Admission: EM | Admit: 2012-09-07 | Discharge: 2012-09-07 | Disposition: A | Discharge status: Other Acute Inpatient Hospital | Payer: Self-pay | Attending: Emergency Medicine | Admitting: Emergency Medicine

## 2012-09-07 ENCOUNTER — Encounter (HOSPITAL_COMMUNITY): Payer: Self-pay | Admitting: Emergency Medicine

## 2012-09-07 DIAGNOSIS — R05 Cough: Secondary | ICD-10-CM | POA: Insufficient documentation

## 2012-09-07 DIAGNOSIS — Z87891 Personal history of nicotine dependence: Secondary | ICD-10-CM | POA: Insufficient documentation

## 2012-09-07 DIAGNOSIS — I10 Essential (primary) hypertension: Secondary | ICD-10-CM | POA: Diagnosis present

## 2012-09-07 DIAGNOSIS — K219 Gastro-esophageal reflux disease without esophagitis: Secondary | ICD-10-CM | POA: Insufficient documentation

## 2012-09-07 DIAGNOSIS — J45909 Unspecified asthma, uncomplicated: Secondary | ICD-10-CM | POA: Diagnosis present

## 2012-09-07 DIAGNOSIS — F251 Schizoaffective disorder, depressive type: Secondary | ICD-10-CM | POA: Diagnosis present

## 2012-09-07 DIAGNOSIS — E7800 Pure hypercholesterolemia, unspecified: Secondary | ICD-10-CM | POA: Insufficient documentation

## 2012-09-07 DIAGNOSIS — R300 Dysuria: Secondary | ICD-10-CM | POA: Insufficient documentation

## 2012-09-07 DIAGNOSIS — E119 Type 2 diabetes mellitus without complications: Secondary | ICD-10-CM | POA: Insufficient documentation

## 2012-09-07 DIAGNOSIS — R109 Unspecified abdominal pain: Secondary | ICD-10-CM | POA: Insufficient documentation

## 2012-09-07 DIAGNOSIS — R059 Cough, unspecified: Secondary | ICD-10-CM | POA: Insufficient documentation

## 2012-09-07 DIAGNOSIS — M25569 Pain in unspecified knee: Secondary | ICD-10-CM | POA: Insufficient documentation

## 2012-09-07 DIAGNOSIS — F319 Bipolar disorder, unspecified: Secondary | ICD-10-CM | POA: Insufficient documentation

## 2012-09-07 DIAGNOSIS — G8929 Other chronic pain: Secondary | ICD-10-CM | POA: Insufficient documentation

## 2012-09-07 DIAGNOSIS — F1020 Alcohol dependence, uncomplicated: Principal | ICD-10-CM | POA: Diagnosis present

## 2012-09-07 DIAGNOSIS — M25562 Pain in left knee: Secondary | ICD-10-CM

## 2012-09-07 DIAGNOSIS — Z79899 Other long term (current) drug therapy: Secondary | ICD-10-CM | POA: Insufficient documentation

## 2012-09-07 DIAGNOSIS — Z8719 Personal history of other diseases of the digestive system: Secondary | ICD-10-CM | POA: Insufficient documentation

## 2012-09-07 DIAGNOSIS — R112 Nausea with vomiting, unspecified: Secondary | ICD-10-CM | POA: Insufficient documentation

## 2012-09-07 DIAGNOSIS — F1410 Cocaine abuse, uncomplicated: Secondary | ICD-10-CM | POA: Diagnosis present

## 2012-09-07 DIAGNOSIS — Z8739 Personal history of other diseases of the musculoskeletal system and connective tissue: Secondary | ICD-10-CM | POA: Insufficient documentation

## 2012-09-07 DIAGNOSIS — F259 Schizoaffective disorder, unspecified: Secondary | ICD-10-CM | POA: Diagnosis present

## 2012-09-07 DIAGNOSIS — F1010 Alcohol abuse, uncomplicated: Secondary | ICD-10-CM

## 2012-09-07 LAB — URINE MICROSCOPIC-ADD ON

## 2012-09-07 LAB — RAPID URINE DRUG SCREEN, HOSP PERFORMED
Amphetamines: NOT DETECTED
Benzodiazepines: NOT DETECTED
Opiates: NOT DETECTED

## 2012-09-07 LAB — CBC
HCT: 34.9 % — ABNORMAL LOW (ref 36.0–46.0)
Hemoglobin: 11.8 g/dL — ABNORMAL LOW (ref 12.0–15.0)
MCH: 28.9 pg (ref 26.0–34.0)
MCHC: 33.8 g/dL (ref 30.0–36.0)
MCV: 85.5 fL (ref 78.0–100.0)
RDW: 12.2 % (ref 11.5–15.5)

## 2012-09-07 LAB — COMPREHENSIVE METABOLIC PANEL
Albumin: 3.8 g/dL (ref 3.5–5.2)
BUN: 14 mg/dL (ref 6–23)
Creatinine, Ser: 0.84 mg/dL (ref 0.50–1.10)
GFR calc Af Amer: >90 mL/min (ref >90)
Glucose, Bld: 99 mg/dL (ref 70–99)
Total Protein: 7.7 g/dL (ref 6.0–8.3)

## 2012-09-07 LAB — ETHANOL: Alcohol, Ethyl (B): 70 mg/dL — ABNORMAL HIGH (ref 0–11)

## 2012-09-07 LAB — URINALYSIS, ROUTINE W REFLEX MICROSCOPIC
Bilirubin Urine: NEGATIVE
Glucose, UA: NEGATIVE mg/dL
Hgb urine dipstick: NEGATIVE
Ketones, ur: NEGATIVE mg/dL
Protein, ur: NEGATIVE mg/dL

## 2012-09-07 MED ORDER — DICLOFENAC SODIUM ER 100 MG PO TB24: 100.0000 mg | ORAL_TABLET | Freq: Two times a day (BID) | ORAL | Status: DC | PRN

## 2012-09-07 MED ORDER — RISPERIDONE 0.5 MG PO TABS
3.0000 mg | ORAL_TABLET | Freq: Every day | ORAL | Status: DC
  Administered 2012-09-07: 3 mg via ORAL
  Filled 2012-09-07: qty 6

## 2012-09-07 MED ORDER — ALBUTEROL SULFATE HFA 108 (90 BASE) MCG/ACT IN AERS
2.0000 | INHALATION_SPRAY | Freq: Four times a day (QID) | RESPIRATORY_TRACT | Status: DC | PRN
  Administered 2012-09-07: 2 via RESPIRATORY_TRACT
  Filled 2012-09-07: qty 6.7

## 2012-09-07 MED ORDER — FOLIC ACID 1 MG PO TABS
1.0000 mg | ORAL_TABLET | Freq: Every day | ORAL | Status: DC
  Administered 2012-09-07: 1 mg via ORAL
  Filled 2012-09-07: qty 1

## 2012-09-07 MED ORDER — OXCARBAZEPINE 150 MG PO TABS
150.0000 mg | ORAL_TABLET | Freq: Two times a day (BID) | ORAL | Status: DC
  Administered 2012-09-07: 150 mg via ORAL
  Filled 2012-09-07: qty 1

## 2012-09-07 MED ORDER — PANTOPRAZOLE SODIUM 40 MG PO TBEC
40.0000 mg | DELAYED_RELEASE_TABLET | Freq: Every day | ORAL | Status: DC
  Administered 2012-09-07: 40 mg via ORAL
  Filled 2012-09-07: qty 1

## 2012-09-07 MED ORDER — TRAZODONE HCL 50 MG PO TABS
300.0000 mg | ORAL_TABLET | Freq: Every day | ORAL | Status: DC
  Administered 2012-09-07: 300 mg via ORAL
  Filled 2012-09-07: qty 2
  Filled 2012-09-07: qty 4

## 2012-09-07 MED ORDER — IBUPROFEN 200 MG PO TABS
600.0000 mg | ORAL_TABLET | Freq: Three times a day (TID) | ORAL | Status: DC | PRN
  Filled 2012-09-07: qty 1

## 2012-09-07 MED ORDER — LORATADINE 10 MG PO TABS: 10.0000 mg | ORAL_TABLET | Freq: Every day | ORAL | Status: DC

## 2012-09-07 MED ORDER — ALUM & MAG HYDROXIDE-SIMETH 200-200-20 MG/5ML PO SUSP: 30.0000 mL | ORAL | Status: DC | PRN

## 2012-09-07 MED ORDER — LORAZEPAM 1 MG PO TABS
0.0000 mg | ORAL_TABLET | Freq: Four times a day (QID) | ORAL | Status: DC
  Administered 2012-09-07: 1 mg via ORAL

## 2012-09-07 MED ORDER — NICOTINE 21 MG/24HR TD PT24
21.0000 mg | MEDICATED_PATCH | Freq: Every day | TRANSDERMAL | Status: DC
  Administered 2012-09-07: 21 mg via TRANSDERMAL
  Filled 2012-09-07: qty 1

## 2012-09-07 MED ORDER — ONDANSETRON HCL 8 MG PO TABS: 4.0000 mg | ORAL_TABLET | Freq: Three times a day (TID) | ORAL | Status: DC | PRN

## 2012-09-07 MED ORDER — LORAZEPAM 1 MG PO TABS: 1.0000 mg | ORAL_TABLET | Freq: Three times a day (TID) | ORAL | Status: DC | PRN

## 2012-09-07 MED ORDER — HYDROCHLOROTHIAZIDE 25 MG PO TABS
25.0000 mg | ORAL_TABLET | Freq: Every day | ORAL | Status: DC
Start: 2012-09-07 — End: 2012-09-07
  Administered 2012-09-07: 25 mg via ORAL
  Filled 2012-09-07: qty 1

## 2012-09-07 MED ORDER — ADULT MULTIVITAMIN W/MINERALS CH
1.0000 | ORAL_TABLET | Freq: Every day | ORAL | Status: DC
  Administered 2012-09-07: 1 via ORAL
  Filled 2012-09-07: qty 1

## 2012-09-07 MED ORDER — VITAMIN B-1 100 MG PO TABS
100.0000 mg | ORAL_TABLET | Freq: Every day | ORAL | Status: DC
  Administered 2012-09-07: 100 mg via ORAL
  Filled 2012-09-07: qty 1

## 2012-09-07 MED ORDER — CLONAZEPAM 0.5 MG PO TABS
1.0000 mg | ORAL_TABLET | Freq: Two times a day (BID) | ORAL | Status: DC
  Administered 2012-09-07: 1 mg via ORAL
  Filled 2012-09-07: qty 2

## 2012-09-07 MED ORDER — LORAZEPAM 2 MG/ML IJ SOLN: 1.0000 mg | Freq: Four times a day (QID) | INTRAMUSCULAR | Status: DC | PRN

## 2012-09-07 MED ORDER — THIAMINE HCL 100 MG/ML IJ SOLN: 100.0000 mg | Freq: Every day | INTRAMUSCULAR | Status: DC

## 2012-09-07 MED ORDER — BUPROPION HCL ER (SR) 150 MG PO TB12
150.0000 mg | ORAL_TABLET | Freq: Two times a day (BID) | ORAL | Status: DC
  Administered 2012-09-07: 150 mg via ORAL
  Filled 2012-09-07: qty 1

## 2012-09-07 MED ORDER — LORAZEPAM 1 MG PO TABS: 0.0000 mg | ORAL_TABLET | Freq: Two times a day (BID) | ORAL | Status: DC

## 2012-09-07 MED ORDER — ATENOLOL 25 MG PO TABS
100.0000 mg | ORAL_TABLET | Freq: Every day | ORAL | Status: DC
  Administered 2012-09-07: 100 mg via ORAL
  Filled 2012-09-07: qty 4

## 2012-09-07 MED ORDER — ZOLPIDEM TARTRATE 5 MG PO TABS
5.0000 mg | ORAL_TABLET | Freq: Every evening | ORAL | Status: DC | PRN
  Administered 2012-09-07: 5 mg via ORAL
  Filled 2012-09-07: qty 1

## 2012-09-07 MED ORDER — LORAZEPAM 1 MG PO TABS
1.0000 mg | ORAL_TABLET | Freq: Four times a day (QID) | ORAL | Status: DC | PRN
  Filled 2012-09-07: qty 1

## 2012-09-07 MED ORDER — DICLOFENAC SODIUM 50 MG PO TBEC
100.0000 mg | DELAYED_RELEASE_TABLET | Freq: Two times a day (BID) | ORAL | Status: DC | PRN
Start: 2012-09-07 — End: 2012-09-07
  Filled 2012-09-07: qty 2

## 2012-09-07 MED ORDER — LAMOTRIGINE 150 MG PO TABS: 150.0000 mg | ORAL_TABLET | Freq: Every day | ORAL | Status: DC

## 2012-09-07 MED ORDER — BENZONATATE 100 MG PO CAPS
100.0000 mg | ORAL_CAPSULE | Freq: Two times a day (BID) | ORAL | Status: DC
  Administered 2012-09-07: 100 mg via ORAL
  Filled 2012-09-07: qty 1

## 2012-09-07 NOTE — ED Notes (Addendum)
Pt is requesting detox from alcohol.  She has been drinking alcohol for approx. 1 week.  Last drink was this AM.  Drinks 3 40oz per day.  Last used of crack was 2 weeks ago.  Has hx of alcohol abuse with detox.

## 2012-09-07 NOTE — ED Notes (Signed)
ACT team speaking with patient

## 2012-09-07 NOTE — ED Provider Notes (Signed)
History    This chart was scribed for non-physician practitioner, Trixie Dredge working with Nelia Shi, MD by Smitty Pluck, ED scribe. This patient was seen in room TR05C/TR05C and the patient's care was started at 4:14 PM.   CSN: [id number]  Arrival date & time 09/07/12  1357      Chief Complaint  Patient presents with  . Knee Pain    The history is provided by the patient. No language interpreter was used.   Maureen Swanson is a 47 y.o. female who presents to the Emergency Department BIB EMS complaining of intermittent moderate left knee pain that has been going for past year but worsened today. She states that today she has throbbing sensation in left knee. She denies injury to left knee or fall. Pt is ambulatory. She mentions hx of degenerative arthritis and gout. She states pain is worse at night and is aggravated by flexion of knee. She denies hx of knee surgery. Pt denies taking medication PTA.   She reports that she wants detox from alcohol and cocaine because she is tired of using drugs. She mentions that she drinks 3 40oz beers daily and today had 1 40oz beer. She states she last used cocaine 1 week ago. Pt mentions having lower abdominal pain, dysuria, non-productive cough, nausea and vomiting last night due to drinking so much alcohol. Pt denies fever, chills, diarrhea, weakness, cough, SOB, SI, HI, and any other pain.   Past Medical History  Diagnosis Date  . High cholesterol   . Depression   . Gout   . Anxiety   . Bipolar 1 disorder   . GERD (gastroesophageal reflux disease)   . Substance abuse     crack cocaine  . Headache     migraines  . TMJ (temporomandibular joint disorder)   . Asthma     daily and prn inhalers  . Arthritis     back and knees  . Gastric ulcer   . 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 Hypertension     under control, has been on med. x 12 yrs.  . Diabetes mellitus     diet-controlled    Past Surgical History   Procedure Laterality Date  . Knee arthroscopy w/ partial medial meniscectomy  05/01/2010    right  . Breast lumpectomy with needle localization  04/19/2012    Procedure: BREAST LUMPECTOMY WITH NEEDLE LOCALIZATION;  Surgeon: Robyne Askew, MD;  Location: [facility];  Service: General;  Laterality: Right;    Family History  Problem Relation Age of Onset  . Diabetes Mother   . Cancer Mother     breast  . Cancer Father     History  Substance Use Topics  . Smoking status: Former Smoker -- 15 years    Quit date: 08/02/2012  . Smokeless tobacco: Never Used     Comment: quit smoking 02/2012  . Alcohol Use: No     Comment: no longer drinking    OB History   Grav Para Term Preterm Abortions TAB SAB Ect Mult Living                  Review of Systems  Constitutional: Negative for fever and chills.  Respiratory: Positive for cough. Negative for shortness of breath.   Gastrointestinal: Positive for nausea, vomiting and abdominal pain. Negative for diarrhea.  Genitourinary: Positive for dysuria.  Musculoskeletal: Positive for arthralgias.  Neurological: Negative for weakness.  Psychiatric/Behavioral: Negative for confusion and self-injury.    Allergies  Chocolate; Orange; Penicillins; and Tomato  Home Medications   Current Outpatient Rx  Name  Route  Sig  Dispense  Refill  . albuterol (PROVENTIL HFA;VENTOLIN HFA) 108 (90 BASE) MCG/ACT inhaler   Inhalation   Inhale 2 puffs into the lungs every 6 (six) hours as needed for wheezing or shortness of breath.   18 g   0   . atenolol (TENORMIN) 100 MG tablet   Oral   Take 100 mg by mouth daily.         . benzonatate (TESSALON) 100 MG capsule   Oral   Take 100 mg by mouth 2 (two) times daily.         Marland Kitchen buPROPion (WELLBUTRIN SR) 150 MG 12 hr tablet   Oral   Take 150 mg by mouth 2 (two) times daily.         . cetirizine (ZYRTEC) 10 MG tablet   Oral   Take 10 mg by mouth daily.         . clonazePAM  (KLONOPIN) 1 MG tablet   Oral   Take 1 mg by mouth 2 (two) times daily.         . Diclofenac Sodium CR 100 MG 24 hr tablet   Oral   Take 100 mg by mouth 2 (two) times daily as needed for pain (for pain).         . hydrochlorothiazide (HYDRODIURIL) 25 MG tablet   Oral   Take 1 tablet (25 mg total) by mouth daily. For blood pressure control.   30 tablet   0   . lamoTRIgine (LAMICTAL) 100 MG tablet   Oral   Take 150 mg by mouth daily.         . OXcarbazepine (TRILEPTAL) 150 MG tablet   Oral   Take 150 mg by mouth 2 (two) times daily.         . pantoprazole (PROTONIX) 40 MG tablet   Oral   Take 1 tablet (40 mg total) by mouth at bedtime. For Reflux.   30 tablet   0   . risperiDONE (RISPERDAL) 3 MG tablet   Oral   Take 3 mg by mouth at bedtime.         . traZODone (DESYREL) 150 MG tablet   Oral   Take 300 mg by mouth at bedtime.           BP 133/89  Pulse 74  Temp(Src) 98.2 F (36.8 C) (Oral)  Resp 18  SpO2 98%  Physical Exam  Nursing note and vitals reviewed. Constitutional: She appears well-developed and well-nourished. No distress.  HENT:  Head: Normocephalic and atraumatic.  Neck: Neck supple.  Cardiovascular: Normal rate and regular rhythm.   Pulmonary/Chest: Effort normal and breath sounds normal. No respiratory distress. She has no wheezes. She has no rales.  Abdominal: Soft. She exhibits no distension. There is no tenderness. There is no rebound and no guarding.  Musculoskeletal:  General tenderness to left knee DP/PT pulses are intact Sensation intact No left calf tenderness No laxity of joint No warmth of left knee No erythema of left knee Full ROM of left knee  Full ROM of left hip   Neurological: She is alert.  Skin: She is not diaphoretic.    ED Course  Procedures (including critical care time) DIAGNOSTIC STUDIES: Oxygen Saturation is 98% on room air, normal by my interpretation.    COORDINATION OF CARE:  4:27 PM Discussed  ED treatment with pt and pt agrees.    Labs Reviewed  URINALYSIS, ROUTINE W REFLEX MICROSCOPIC - Abnormal; Notable for the following:    Urobilinogen, UA 2.0 (*)    Leukocytes, UA SMALL (*)    All other components within normal limits  CBC - Abnormal; Notable for the following:    Hemoglobin 11.8 (*)    HCT 34.9 (*)    All other components within normal limits  COMPREHENSIVE METABOLIC PANEL - Abnormal; Notable for the following:    Potassium 3.4 (*)    GFR calc non Af Amer 81 (*)    All other components within normal limits  ETHANOL - Abnormal; Notable for the following:    Alcohol, Ethyl (B) 70 (*)    All other components within normal limits  URINE RAPID DRUG SCREEN (HOSP PERFORMED) - Abnormal; Notable for the following:    Cocaine POSITIVE (*)    All other components within normal limits  URINE MICROSCOPIC-ADD ON - Abnormal; Notable for the following:    Squamous Epithelial / LPF FEW (*)    Bacteria, UA FEW (*)    All other components within normal limits   No results found.  4:54 PM Pt seen by ACT team.   Now states she has been drinking ETOH 3-4 40 oz beers daily x 2 weeks.  Seeking inpatient treatment.    1. Chronic knee pain, left   2. Alcohol abuse       MDM  Pt with chronic knee pain that is unchanged.  No red flags.  Pt also requesting detox from ETOH, evaluated by ACT team.     I doubt any other [facility] precluding discharge at this time including, but not necessarily limited to the following:   I personally performed the services described in this documentation, which was scribed in my presence. The recorded information has been reviewed and is accurate.    Trixie Dredge, PA-C 09/07/12 2356

## 2012-09-07 NOTE — ED Notes (Signed)
The patient is AOx4 and stable for transport.  She is being transport to [facility] by security and Coral Ceo, EMT.
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
## 2012-09-07 NOTE — BH Assessment (Signed)
[facility] Assessment Progress Note    Pt has been accepted to [facility] by Melvenia Beam to Dr. Dub Mikes.  Room 300-2.  Patient has signed voluntary admission paperwork.  Dr. Bebe Shaggy and nurse Greta Doom informed.  Security to provide transport.

## 2012-09-07 NOTE — ED Notes (Signed)
Pt arrives via EMS for left knee pain which she has had for 3 years. Today's pain is worse with throbbing sensation. Per EMS pt ambulatory to EMS truck. Denies recent injury, trauma or fall. Pt with knee brace to affected extremity. Distal circulation intact.

## 2012-09-08 ENCOUNTER — Encounter (HOSPITAL_COMMUNITY): Payer: Self-pay

## 2012-09-08 DIAGNOSIS — F1020 Alcohol dependence, uncomplicated: Secondary | ICD-10-CM

## 2012-09-08 DIAGNOSIS — M25569 Pain in unspecified knee: Secondary | ICD-10-CM

## 2012-09-08 DIAGNOSIS — F329 Major depressive disorder, single episode, unspecified: Secondary | ICD-10-CM

## 2012-09-08 DIAGNOSIS — F1410 Cocaine abuse, uncomplicated: Secondary | ICD-10-CM

## 2012-09-08 MED ORDER — HYDRALAZINE HCL 25 MG PO TABS
25.0000 mg | ORAL_TABLET | Freq: Three times a day (TID) | ORAL | Status: DC
  Administered 2012-09-08 – 2012-09-13 (×15): 25 mg via ORAL
  Filled 2012-09-08 (×21): qty 1

## 2012-09-08 MED ORDER — ONDANSETRON 4 MG PO TBDP: 4.0000 mg | ORAL_TABLET | Freq: Four times a day (QID) | ORAL | Status: DC | PRN

## 2012-09-08 MED ORDER — VITAMIN B-1 100 MG PO TABS: 100.0000 mg | ORAL_TABLET | Freq: Every day | ORAL | Status: DC

## 2012-09-08 MED ORDER — CLONAZEPAM 1 MG PO TABS
1.0000 mg | ORAL_TABLET | Freq: Two times a day (BID) | ORAL | Status: DC
  Administered 2012-09-08 – 2012-09-09 (×3): 1 mg via ORAL
  Filled 2012-09-08 (×3): qty 1

## 2012-09-08 MED ORDER — CHLORDIAZEPOXIDE HCL 25 MG PO CAPS
25.0000 mg | ORAL_CAPSULE | Freq: Every day | ORAL | Status: AC
  Administered 2012-09-11: 25 mg via ORAL

## 2012-09-08 MED ORDER — PNEUMOCOCCAL VAC POLYVALENT 25 MCG/0.5ML IJ INJ
0.5000 mL | INJECTION | INTRAMUSCULAR | Status: AC
  Administered 2012-09-09: 0.5 mL via INTRAMUSCULAR

## 2012-09-08 MED ORDER — NICOTINE 14 MG/24HR TD PT24
14.0000 mg | MEDICATED_PATCH | Freq: Every day | TRANSDERMAL | Status: DC
  Administered 2012-09-08 – 2012-09-13 (×6): 14 mg via TRANSDERMAL
  Filled 2012-09-08 (×9): qty 1

## 2012-09-08 MED ORDER — BUPROPION HCL ER (SR) 150 MG PO TB12
150.0000 mg | ORAL_TABLET | Freq: Two times a day (BID) | ORAL | Status: DC
  Administered 2012-09-08 – 2012-09-13 (×11): 150 mg via ORAL
  Filled 2012-09-08 (×9): qty 1
  Filled 2012-09-08: qty 28
  Filled 2012-09-08 (×6): qty 1
  Filled 2012-09-08: qty 28

## 2012-09-08 MED ORDER — CLONAZEPAM 0.5 MG PO TABS
0.5000 mg | ORAL_TABLET | Freq: Once | ORAL | Status: AC
  Administered 2012-09-09: 0.5 mg via ORAL

## 2012-09-08 MED ORDER — BENZONATATE 100 MG PO CAPS
100.0000 mg | ORAL_CAPSULE | Freq: Two times a day (BID) | ORAL | Status: DC
  Administered 2012-09-08 – 2012-09-13 (×11): 100 mg via ORAL
  Filled 2012-09-08 (×4): qty 1
  Filled 2012-09-08: qty 28
  Filled 2012-09-08 (×8): qty 1
  Filled 2012-09-08: qty 28
  Filled 2012-09-08: qty 1

## 2012-09-08 MED ORDER — HYDROCHLOROTHIAZIDE 25 MG PO TABS
25.0000 mg | ORAL_TABLET | Freq: Every day | ORAL | Status: DC
  Administered 2012-09-08 – 2012-09-13 (×6): 25 mg via ORAL
  Filled 2012-09-08 (×8): qty 1

## 2012-09-08 MED ORDER — ALBUTEROL SULFATE HFA 108 (90 BASE) MCG/ACT IN AERS: 2.0000 | INHALATION_SPRAY | Freq: Four times a day (QID) | RESPIRATORY_TRACT | Status: DC | PRN

## 2012-09-08 MED ORDER — ATENOLOL 50 MG PO TABS
100.0000 mg | ORAL_TABLET | Freq: Every day | ORAL | Status: DC
  Administered 2012-09-08 – 2012-09-13 (×6): 100 mg via ORAL
  Filled 2012-09-08 (×3): qty 1
  Filled 2012-09-08: qty 28
  Filled 2012-09-08 (×2): qty 1
  Filled 2012-09-08: qty 2
  Filled 2012-09-08 (×2): qty 1

## 2012-09-08 MED ORDER — CHLORDIAZEPOXIDE HCL 25 MG PO CAPS
25.0000 mg | ORAL_CAPSULE | Freq: Four times a day (QID) | ORAL | Status: AC | PRN
  Administered 2012-09-10: 25 mg via ORAL
  Filled 2012-09-08: qty 1

## 2012-09-08 MED ORDER — CHLORDIAZEPOXIDE HCL 25 MG PO CAPS
25.0000 mg | ORAL_CAPSULE | Freq: Three times a day (TID) | ORAL | Status: AC
  Administered 2012-09-10 – 2012-09-11 (×3): 25 mg via ORAL
  Filled 2012-09-08 (×3): qty 1

## 2012-09-08 MED ORDER — LOPERAMIDE HCL 2 MG PO CAPS: 2.0000 mg | ORAL_CAPSULE | ORAL | Status: DC | PRN

## 2012-09-08 MED ORDER — OXCARBAZEPINE 300 MG PO TABS
300.0000 mg | ORAL_TABLET | Freq: Two times a day (BID) | ORAL | Status: DC
  Administered 2012-09-08 – 2012-09-13 (×10): 300 mg via ORAL
  Filled 2012-09-08 (×2): qty 1
  Filled 2012-09-08: qty 28
  Filled 2012-09-08: qty 1
  Filled 2012-09-08: qty 28
  Filled 2012-09-08 (×9): qty 1

## 2012-09-08 MED ORDER — PANTOPRAZOLE SODIUM 40 MG PO TBEC
40.0000 mg | DELAYED_RELEASE_TABLET | Freq: Every day | ORAL | Status: DC
  Administered 2012-09-08 – 2012-09-12 (×5): 40 mg via ORAL
  Filled 2012-09-08 (×4): qty 1
  Filled 2012-09-08: qty 14
  Filled 2012-09-08 (×4): qty 1

## 2012-09-08 MED ORDER — LORATADINE 10 MG PO TABS
10.0000 mg | ORAL_TABLET | Freq: Every day | ORAL | Status: DC
  Administered 2012-09-08 – 2012-09-13 (×6): 10 mg via ORAL
  Filled 2012-09-08 (×5): qty 1
  Filled 2012-09-08: qty 14
  Filled 2012-09-08 (×2): qty 1

## 2012-09-08 MED ORDER — CHLORDIAZEPOXIDE HCL 25 MG PO CAPS
50.0000 mg | ORAL_CAPSULE | Freq: Once | ORAL | Status: AC
  Administered 2012-09-08: 50 mg via ORAL
  Filled 2012-09-08: qty 2

## 2012-09-08 MED ORDER — OXCARBAZEPINE 150 MG PO TABS
150.0000 mg | ORAL_TABLET | Freq: Two times a day (BID) | ORAL | Status: DC
  Administered 2012-09-08: 150 mg via ORAL
  Filled 2012-09-08 (×5): qty 1

## 2012-09-08 MED ORDER — POTASSIUM CHLORIDE CRYS ER 20 MEQ PO TBCR
20.0000 meq | EXTENDED_RELEASE_TABLET | Freq: Two times a day (BID) | ORAL | Status: AC
  Administered 2012-09-08 – 2012-09-09 (×4): 20 meq via ORAL
  Filled 2012-09-08 (×4): qty 1

## 2012-09-08 MED ORDER — CHLORDIAZEPOXIDE HCL 25 MG PO CAPS: 25.0000 mg | ORAL_CAPSULE | Freq: Four times a day (QID) | ORAL | Status: DC | PRN

## 2012-09-08 MED ORDER — ALUM & MAG HYDROXIDE-SIMETH 200-200-20 MG/5ML PO SUSP: 30.0000 mL | ORAL | Status: DC | PRN

## 2012-09-08 MED ORDER — THIAMINE HCL 100 MG/ML IJ SOLN: 100.0000 mg | Freq: Once | INTRAMUSCULAR | Status: DC

## 2012-09-08 MED ORDER — INFLUENZA VIRUS VACC SPLIT PF IM SUSP
0.5000 mL | INTRAMUSCULAR | Status: AC
Start: 2012-09-09 — End: 2012-09-09
  Administered 2012-09-09: 0.5 mL via INTRAMUSCULAR

## 2012-09-08 MED ORDER — ACETAMINOPHEN 325 MG PO TABS
650.0000 mg | ORAL_TABLET | Freq: Four times a day (QID) | ORAL | Status: DC | PRN
  Administered 2012-09-08 – 2012-09-13 (×8): 650 mg via ORAL

## 2012-09-08 MED ORDER — CLONAZEPAM 0.5 MG PO TABS
0.5000 mg | ORAL_TABLET | Freq: Once | ORAL | Status: DC
  Filled 2012-09-08: qty 1

## 2012-09-08 MED ORDER — DICLOFENAC SODIUM 50 MG PO TBEC
100.0000 mg | DELAYED_RELEASE_TABLET | Freq: Two times a day (BID) | ORAL | Status: DC | PRN
  Administered 2012-09-08: 100 mg via ORAL
  Filled 2012-09-08 (×2): qty 1

## 2012-09-08 MED ORDER — VITAMIN B-1 100 MG PO TABS
100.0000 mg | ORAL_TABLET | Freq: Every day | ORAL | Status: DC
  Administered 2012-09-09 – 2012-09-13 (×5): 100 mg via ORAL
  Filled 2012-09-08 (×7): qty 1

## 2012-09-08 MED ORDER — ADULT MULTIVITAMIN W/MINERALS CH: 1.0000 | ORAL_TABLET | Freq: Every day | ORAL | Status: DC

## 2012-09-08 MED ORDER — LAMOTRIGINE 100 MG PO TABS
150.0000 mg | ORAL_TABLET | Freq: Every day | ORAL | Status: DC
  Administered 2012-09-08 – 2012-09-13 (×6): 150 mg via ORAL
  Filled 2012-09-08 (×3): qty 1
  Filled 2012-09-08: qty 21
  Filled 2012-09-08 (×4): qty 1

## 2012-09-08 MED ORDER — HYDROXYZINE HCL 25 MG PO TABS
25.0000 mg | ORAL_TABLET | Freq: Four times a day (QID) | ORAL | Status: DC | PRN
Start: 2012-09-08 — End: 2012-09-08

## 2012-09-08 MED ORDER — MAGNESIUM HYDROXIDE 400 MG/5ML PO SUSP: 30.0000 mL | Freq: Every day | ORAL | Status: DC | PRN

## 2012-09-08 MED ORDER — RISPERIDONE 3 MG PO TABS
3.0000 mg | ORAL_TABLET | Freq: Every day | ORAL | Status: DC
  Administered 2012-09-08 – 2012-09-12 (×5): 3 mg via ORAL
  Filled 2012-09-08 (×2): qty 1
  Filled 2012-09-08: qty 14
  Filled 2012-09-08 (×3): qty 1
  Filled 2012-09-08: qty 3
  Filled 2012-09-08 (×2): qty 1

## 2012-09-08 MED ORDER — ADULT MULTIVITAMIN W/MINERALS CH
1.0000 | ORAL_TABLET | Freq: Every day | ORAL | Status: DC
  Administered 2012-09-08 – 2012-09-13 (×6): 1 via ORAL
  Filled 2012-09-08 (×8): qty 1

## 2012-09-08 MED ORDER — TRAZODONE HCL 100 MG PO TABS
300.0000 mg | ORAL_TABLET | Freq: Every day | ORAL | Status: DC
  Administered 2012-09-08 – 2012-09-11 (×4): 300 mg via ORAL
  Filled 2012-09-08 (×7): qty 3

## 2012-09-08 MED ORDER — ONDANSETRON 4 MG PO TBDP
4.0000 mg | ORAL_TABLET | Freq: Four times a day (QID) | ORAL | Status: AC | PRN
  Administered 2012-09-08: 4 mg via ORAL

## 2012-09-08 MED ORDER — CHLORDIAZEPOXIDE HCL 25 MG PO CAPS
25.0000 mg | ORAL_CAPSULE | ORAL | Status: AC
  Administered 2012-09-11 – 2012-09-12 (×2): 25 mg via ORAL
  Filled 2012-09-08 (×2): qty 1

## 2012-09-08 MED ORDER — CHLORDIAZEPOXIDE HCL 25 MG PO CAPS
25.0000 mg | ORAL_CAPSULE | Freq: Four times a day (QID) | ORAL | Status: AC
  Administered 2012-09-08 – 2012-09-10 (×6): 25 mg via ORAL
  Filled 2012-09-08 (×6): qty 1

## 2012-09-08 MED ORDER — HYDROXYZINE HCL 25 MG PO TABS: 25.0000 mg | ORAL_TABLET | Freq: Four times a day (QID) | ORAL | Status: AC | PRN

## 2012-09-08 MED ORDER — LOPERAMIDE HCL 2 MG PO CAPS: 2.0000 mg | ORAL_CAPSULE | ORAL | Status: AC | PRN

## 2012-09-08 NOTE — ED Provider Notes (Signed)
Medical screening examination/treatment/procedure(s) were performed by non-physician practitioner and as supervising physician I was immediately available for consultation/collaboration.   Nelia Shi, MD 09/08/12 2245

## 2012-09-08 NOTE — Consult Note (Addendum)
[facility] Medical Consultation  Maureen Swanson WUJ:[id number] DOB: 02-22-1966 DOA: 09/07/2012 PCP: Dartha Lodge, FNP   Requesting physician: [facility] Date of consultation: 3/26 Reason for consultation: Hypertension, uncontrolled  Impression/Recommendations  1. Uncontrolled HTN: long standing per pt -creatinine normal, no proteinuria -continue Atenolol and HCTZ -add hydralazine 25mg  TID -DC NSAIDs - if starts having ETOH withdrawal then BP will trend up further -may need further titration inpatient depending on response  2. Chronic L knee pain: for 2 years -tylenol PRN, DC NSAIDs -FU with Ortho at discharge, has previously had cortisone injections for this  3. ETOH detox/depression: per psych   Thank you for this consultation We will Follow up.  [name], MD [phone]  Chief Complaint: Knee pain and ETOH detox  HPI:  47/F with h/o HTN, long standing and uncontrolled, chronic L knee pain and ETOH abuse admitted to [facility] 3/25 for ETOH detox and depression. Pt reports compliance with medications, but says that SBP usually runs in 160s and DBP in 100s range. Last pm BP was 160/108 and better since [facility] consult was requested to assist with BP control. She denies any chest pain, SoB, lower extremity edema, headache or blurring of vision  Review of Systems:  12 system review done and negative except for L knee pain and   Past Medical History  Diagnosis Date  . High cholesterol   . Depression   . Gout   . Anxiety   . Bipolar 1 disorder   . GERD (gastroesophageal reflux disease)   . Substance abuse     crack cocaine  . Headache     migraines  . TMJ (temporomandibular joint disorder)   . Asthma     daily and prn inhalers  . Arthritis     back and knees  . Gastric ulcer   . 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 Hypertension     under control, has been on med. x 12 yrs.  . Diabetes mellitus     diet-controlled   Past Surgical History   Procedure Laterality Date  . Knee arthroscopy w/ partial medial meniscectomy  05/01/2010    right  . Breast lumpectomy with needle localization  04/19/2012    Procedure: BREAST LUMPECTOMY WITH NEEDLE LOCALIZATION;  Surgeon: Robyne Askew, MD;  Location: [facility];  Service: General;  Laterality: Right;   Social History:  reports that she quit smoking about 5 weeks ago. She has never used smokeless tobacco. She reports that she uses illicit drugs (Cocaine) about once per week. She reports that she does not drink alcohol.  Allergies  Allergen Reactions  . Chocolate Hives  . Orange Hives    "Acid foods"  . Penicillins Hives  . Tomato Hives    "acid foods"   Family History  Problem Relation Age of Onset  . Diabetes Mother   . Cancer Mother     breast  . Cancer Father     Prior to Admission medications   Medication Sig Start Date End Date Taking? Authorizing Provider  albuterol (PROVENTIL HFA;VENTOLIN HFA) 108 (90 BASE) MCG/ACT inhaler Inhale 2 puffs into the lungs every 6 (six) hours as needed for wheezing or shortness of breath. 02/27/12 02/26/13 Yes Curlene Labrum Readling, MD  atenolol (TENORMIN) 100 MG tablet Take 100 mg by mouth daily.   Yes Historical Provider, MD  benzonatate (TESSALON) 100 MG capsule Take 100 mg by mouth 2 (two) times daily.   Yes Historical Provider,  MD  buPROPion (WELLBUTRIN SR) 150 MG 12 hr tablet Take 150 mg by mouth 2 (two) times daily.   Yes Historical Provider, MD  cetirizine (ZYRTEC) 10 MG tablet Take 10 mg by mouth daily.   Yes Historical Provider, MD  clonazePAM (KLONOPIN) 1 MG tablet Take 1 mg by mouth 2 (two) times daily.   Yes Historical Provider, MD  Diclofenac Sodium CR 100 MG 24 hr tablet Take 100 mg by mouth 2 (two) times daily as needed for pain (for pain).   Yes Historical Provider, MD  hydrochlorothiazide (HYDRODIURIL) 25 MG tablet Take 1 tablet (25 mg total) by mouth daily. For blood pressure control. 02/27/12 02/26/13 Yes Curlene Labrum  Readling, MD  lamoTRIgine (LAMICTAL) 100 MG tablet Take 150 mg by mouth daily.   Yes Historical Provider, MD  OXcarbazepine (TRILEPTAL) 150 MG tablet Take 150 mg by mouth 2 (two) times daily.   Yes Historical Provider, MD  pantoprazole (PROTONIX) 40 MG tablet Take 1 tablet (40 mg total) by mouth at bedtime. For Reflux. 02/27/12 02/26/13 Yes Curlene Labrum Readling, MD  risperiDONE (RISPERDAL) 3 MG tablet Take 3 mg by mouth at bedtime.   Yes Historical Provider, MD  traZODone (DESYREL) 150 MG tablet Take 300 mg by mouth at bedtime.   Yes Historical Provider, MD   Physical Exam: Blood pressure 131/88, pulse 72, temperature 98.2 F (36.8 C), temperature source Oral, resp. rate 20, height 5\' 4"  (1.626 m), weight 86.637 kg (191 lb). Filed Vitals:   09/08/12 1200 09/08/12 1201 09/08/12 1700 09/08/12 1701  BP: 122/84 131/94 139/94 131/88  Pulse: 72 77 77 72  Temp:   98.2 F (36.8 C)   TempSrc:      Resp:      Height:      Weight:         General:  AAOx3, no distress  HEENT: PERRLA, EOMI  Cardiovascular: S1S2/RRR  Respiratory: CTAB  Abdomen: soft, NT , BS present  Skin: no rashes or skin breakdown  Musculoskeletal: L knee in brace, no erythema /swelling, slight pain with range of movement  Psychiatric: appropriate mood and affect  Neurologic: non focal  Labs on Admission:  Basic Metabolic Panel:  Recent Labs Lab 09/07/12 1730  NA 139  K 3.4*  CL 101  CO2 24  GLUCOSE 99  BUN 14  CREATININE 0.84  CALCIUM 9.3   Liver Function Tests:  Recent Labs Lab 09/07/12 1730  AST 24  ALT 15  ALKPHOS 61  BILITOT 1.0  PROT 7.7  ALBUMIN 3.8   No results found for this basename: LIPASE, AMYLASE,  in the last 168 hours No results found for this basename: AMMONIA,  in the last 168 hours CBC:  Recent Labs Lab 09/07/12 1730  WBC 4.5  HGB 11.8*  HCT 34.9*  MCV 85.5  PLT 204   Cardiac Enzymes: No results found for this basename: CKTOTAL, CKMB, CKMBINDEX, TROPONINI,  in the  last 168 hours BNP: No components found with this basename: POCBNP,  CBG: No results found for this basename: GLUCAP,  in the last 168 hours  Radiological Exams on Admission: No results found.   Time spent:  [facility] Pager [phone]  If 7PM-7AM, please contact night-coverage [email or web address] Password [facility] 09/08/2012, 6:43 PM

## 2012-09-08 NOTE — Progress Notes (Signed)
[facility] LCSW Group Therapy - Late Entry  09/08/2012 1:15 PM  Type of Therapy: Group Therapy 1:15 to 2:30 PM  Participation Level:  Minimal  Participation Quality: Attentive  Affect: Flat  Cognitive: Alert, Oriented  Insight: None shared  Engagement in Therapy:  Limited  Modes of Intervention: Discussion, Exploration, Support, Problemsolving  Summary of Progress/Problems: The focus of this group session was to process how we deal with difficult emotions and share with others the patterns that play out when we are reacting to the emotion verses the situation.  Josi choose not to share even when asked specifically for input; she was attentive.  Clide Dales

## 2012-09-08 NOTE — Progress Notes (Signed)
Pt has been up for all her groups and activities today.  She rated her depression and hopelessness a 8 and her anxiety a 9 on her self-inventory.  She was placed on the librium protocol and her klonopin is tapering off.  Pt aware and voiced understanding.  She plans to continue with her out-patient treatment when she is ready to leave. Her bp has been elevated and she has been placed on hydralazine 25 mg tid along with her regular medications. She denied any S/H ideation or A/V hallucinations.

## 2012-09-08 NOTE — Tx Team (Signed)
Interdisciplinary Treatment Plan Update (Adult)  Date: 09/08/2012  Time Reviewed: 9:548 AM   Progress in Treatment: Attending groups: Yes Participating in groups: Unknown as yet as just admitted Taking medication as prescribed:  Yes Tolerating medication:  Yes Family/Significant othe contact made: No Patient understands diagnosis: Yes Discussing patient identified problems/goals with staff: Yes Medical problems stabilized or resolved:  Yes Denies suicidal/homicidal ideation: Yes Patient has not harmed self or Others: Yes  New problem(s) identified: None Identified  Discharge Plan or Barriers:  CSW is assessing for appropriate referrals. Patient currently seen at [facility] for Medication Management and Therapy and wishes to return there.   Additional comments: N/A  Reason for Continuation of Hospitalization: Anxiety Depression Withdrawal symptoms   Estimated length of stay: 5-7 days  For review of initial/current patient goals, please see plan of care.  Attendees: Patient:     Family:     Physician:  Serena Colonel PA 09/08/2012 9:58 AM   Nursing:   Roswell Miners, RN 09/08/2012 9:58 AM   Clinical Social Worker Ronda Fairly 09/08/2012 9:58 AM   Other:  Liliane Bade, Community Care Cooordinator 09/08/2012 9:58 AM   Other:  Robbie Louis, RN 09/08/2012 9:58 AM          Scribe for Treatment Team:   Carney Bern, LCSWA  09/08/2012 9:58 AM

## 2012-09-08 NOTE — Progress Notes (Signed)
Adult Psychoeducational Group Note  Date:  09/08/2012 Time:  11:38 AM  Group Topic/Focus:  Coping With Mental Health Crisis:   The purpose of this group is to help patients identify strategies for coping with mental health crisis.  Group discusses possible causes of crisis and ways to manage them effectively.  Participation Level:  Active  Participation Quality:  Appropriate, Attentive and Sharing  Affect:  Appropriate  Cognitive:  Alert and Appropriate  Insight: Appropriate  Engagement in Group:  Engaged  Modes of Intervention:  Discussion  Additional Comments:  Pt was appropriate and attentive while attending group. Pt shared that stress is what cause her to go into a crisis and she plans to talk to her higher power when she goes through another crisis. Pt plans to try meditation in the future to help improve her health.   Sharyn Lull 09/08/2012, 11:38 AM
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
## 2012-09-08 NOTE — Progress Notes (Signed)
Patient ID: Maureen Swanson, female   DOB: 04-Mar-1966, 47 y.o.   MRN: [id number]  Admission Note:  47 yr female who presents [facility] in no acute distress for the treatment of ETOH detox and Depression. Pt appears flat and depressed. Pt was calm and cooperative with admission process.Pt has Past medical Hx of High cholesterol, Depression, DM, Gout, arthritis, Asthma, HTN, TMJ, GERD, and anxiety. Pt states she has been drinking 3-4 40 oz per day. Pt states her arthritis (L-knee) has been the primary cause for her coming to [facility]. Pt presents in a wheelchair, stating her knee has been bothering her for "some time now".  A:Skin was assessed and found to be clear of any abnormal marks apart from a scars on L/R-calf, abdomen area. POC and unit policies explained and understanding verbalized. Consents obtained.     D:Pt had no additional questions or concerns. Pt was receptive to education about the milieu .  15 min safety checks started. Clinical research associate offered support.

## 2012-09-08 NOTE — Progress Notes (Signed)
[facility] LCSW Aftercare Discharge Planning Group Note  09/08/2012 8:45 AM  Participation Quality:  Attentive and Sharing  Affect: Depressed and Flat  Cognitive:  Alert and Oriented  Insight:, Limited  Engagement in Group:  Limited   Modes of Intervention:  Clarification, Discussion, Exploration, Rapport Building and Support  Summary of Progress/Problems:  Pt denies both suicidal and homicidal ideation.  On a scale of 1 to 10 with ten being the most ever experienced, the patient rates depression at a 7 and anxiety at a 7. Patient reports she came in for help with depression and alcohol abuse.  She lives in [place] and is seen at [facility] [place]. Patient was in a wheelchair and reports she usually gets around with a cane.     Maureen Swanson

## 2012-09-08 NOTE — [facility] Suicide Risk Assessment (Signed)
Suicide Risk Assessment  Admission Assessment     Nursing information obtained from:    Demographic factors:    Current Mental Status:    Loss Factors:    Historical Factors:    Risk Reduction Factors:     CLINICAL FACTORS:   Severe Anxiety and/or Agitation Depression:   Anhedonia Comorbid alcohol abuse/dependence Hopelessness Insomnia Alcohol/Substance Abuse/Dependencies Previous Psychiatric Diagnoses and Treatments  COGNITIVE FEATURES THAT CONTRIBUTE TO RISK:  Closed-mindedness Polarized thinking    SUICIDE RISK:   Moderate:  Frequent suicidal ideation with limited intensity, and duration, some specificity in terms of plans, no associated intent, good self-control, limited dysphoria/symptomatology, some risk factors present, and identifiable protective factors, including available and accessible social support.  PLAN OF CARE:1. Admit for crisis management and stabilization. 2. Medication management to reduce current symptoms to base line and improve the   patient's overall level of functioning 3. Treat health problems as indicated. 4. Develop treatment plan to decrease risk of relapse upon discharge and the need for  readmission. 5. Psycho-social education regarding relapse prevention and self care. 6. Health care follow up as needed for medical problems. 7. Restart home medications where appropriate.   I certify that inpatient services furnished can reasonably be expected to improve the patient's condition.  Thedore Mins, MD 09/08/2012, 11:01 AM

## 2012-09-09 DIAGNOSIS — M25569 Pain in unspecified knee: Secondary | ICD-10-CM

## 2012-09-09 DIAGNOSIS — I10 Essential (primary) hypertension: Secondary | ICD-10-CM

## 2012-09-09 MED ORDER — NAPROXEN SODIUM 550 MG PO TABS
550.0000 mg | ORAL_TABLET | Freq: Two times a day (BID) | ORAL | Status: DC
  Filled 2012-09-09 (×2): qty 1

## 2012-09-09 MED ORDER — NAPROXEN 500 MG PO TABS
500.0000 mg | ORAL_TABLET | Freq: Two times a day (BID) | ORAL | Status: DC
  Administered 2012-09-09 – 2012-09-13 (×7): 500 mg via ORAL
  Filled 2012-09-09 (×11): qty 1

## 2012-09-09 NOTE — Progress Notes (Signed)
Adult Psychosocial Assessment Update Interdisciplinary Team  Previous [facility] admissions/discharges:  Admissions   Date: 09/07/12 Date:  Date: 02/28/12 Date:  Date: Date:  Date: Date:  Date: Date:   Changes since the last Psychosocial Assessment (including adherence to outpatient mental health and/or substance abuse treatment, situational issues contributing to decompensation and/or relapse). Pt reports little change in depressive symptoms after last discharge. She admits to medication noncompliance in past, but recently reports taking all prescribed meds. Pt requesting detox from ETOH and reports crack cocaine abuse. Pt has history of schizoaffective disorder, SI gestures, depression, and anxiety issues. Pt presented with racing thoughts, high depressive symptoms and anxiety, with no current SI. No AVH reported.              Discharge Plan 1. Will you be returning to the same living situation after discharge?   Yes: No:      If no, what is your plan?    Patient hopes to be admitted to inpatient rehab facility. She has date for [facility] set and would like to explore additional options. After rehab concludes, pt plans to return home to her partner.        2. Would you like a referral for services when you are discharged? Yes:     If yes, for what services?  No:       Patient would like referral to inpatient rehab facility. (Appt already made for [facility]). Pt sees psychiatrist and therapist at [facility] and would like to continue working with them.        Summary and Recommendations (to be completed by the evaluator) Patient is 47 year old female living in [place], [place] with her partner. She was admitted to the hospital asking for ETOH detox and assistance with depression, anxiety, and crack cocaine abuse. Recommendations for pt include therapeutic milieu, encourage group attendance and participation, Librium taper for ETOH withdrawals, medication management for mood  stabilization, and development of comprehensive sobriety/mental wellness plan. Pt open to admittance to inpatient rehab facility and would like to continue receiving med management and therapy services from [facility].                        Signature:  Lew Dawes, 09/09/2012 12:05 PM

## 2012-09-09 NOTE — Progress Notes (Signed)
[facility] LCSW Group Therapy  09/09/2012 1:15 PM  Type of Therapy:  Group Therapy 1:15 top 2:30 PM  Participation Level:  Adequate  Participation Quality:  Participation in activity  Affect:  Flat   Cognitive:  Alert and Oriented  Insight:  Developing/Improving  Engagement in Therapy:  Developing/Improving  Modes of Intervention:  Activity, Discussion, Exploration, Rapport Building, Socialization and Support  Summary of Progress/Problems: Focus of group processing discussion was on balance in life; the components in life which have a negative influence on balance and the components which make for a more balanced life.  Patient shared that she feels her addiction takes all her energy. "I know I want to get better but sometimes feel I don't deserve to." Others in group offered support  Yakub Lodes, Julious Payer

## 2012-09-09 NOTE — Progress Notes (Addendum)
D:  Patient's self inventory sheet, patient has fair sleep, improving appetite, normal energy level, improving attention span.  Rated depression, hopelessness and anxiety #7.  Has experienced chilling.  Denied SI.  Pain goal today #7, worst pain.  After discharge, plans to go to meeting, therapist.  Denied HI.  Denied voices.  Stated she saw elephants in her room off/on last night.  No discharge plans.  No problems taking meds after discharge. A:  Medications administered per MD orders.  Staff will monitor every 15 minutes for safety.  Emotional support and encouragement given to patient.  R:  Denied SI and HI.   Denied auditory hallucinations.  Saw elephants in her room last night off/on.  Patient remains safe on unit.   1820  Patient used ice pack on right  knee per MD orders for 20 minutes after dinner tonight.

## 2012-09-09 NOTE — Progress Notes (Addendum)
Recreation Therapy Notes  Date: 03.27.2014 Time: 3:00pm Location: [address]      Group Topic/Focus: Leisure Education  Participation Level: Active  Participation Quality: Appropriate  Affect: Flat  Cognitive: Oriented   Additional Comments: Patients were asked to create a 20 item leisure and recreation "Bucket List." Patients were asked to read list aloud to peers. For each activity patient had in common with peers she was asked to take a poker chip. Patient was able to identify approximately 10 leisure and recreation activities for her "Bucket List." Patient actively participated in group activity. Patient participated in wrap up discussion about the common bonds that can be found using leisure and recreation, as well as finding sober leisure and recreation activities.    Marykay Lex Hymen Arnett, LRT/CTRS  Jearl Klinefelter 09/09/2012 4:26 PM

## 2012-09-09 NOTE — Progress Notes (Signed)
[facility] MD Progress Note  09/09/2012 5:50 PM Maureen Swanson  MRN:  [id number] Subjective:  "I'm here for alcohol and crack" with "my knee hurting." Diagnosis:  Alcohol dependence                      Cocaine abuse  ADL's:  Intact  Sleep: Fair  Appetite:  Fair  Suicidal Ideation:  denies Homicidal Ideation:  denies AEB (as evidenced by): Patient's response to treatment, affect, report of decreased symptoms, improved sleep, mood, affect and appetite.  Psychiatric Specialty Exam: Review of Systems  Constitutional: Negative.  Negative for fever, chills, weight loss, malaise/fatigue and diaphoresis.  HENT: Negative for congestion and sore throat.   Eyes: Negative for blurred vision, double vision and photophobia.  Respiratory: Negative for cough, shortness of breath and wheezing.   Cardiovascular: Negative for chest pain, palpitations and PND.  Gastrointestinal: Positive for nausea. Negative for heartburn, vomiting, abdominal pain, diarrhea and constipation.  Musculoskeletal: Positive for joint pain (Left knee pain). Negative for myalgias and falls.  Neurological: Positive for dizziness. Negative for tingling, tremors, sensory change, speech change, focal weakness, seizures, loss of consciousness, weakness and headaches.  Endo/Heme/Allergies: Negative for polydipsia. Does not bruise/bleed easily.  Psychiatric/Behavioral: Negative for depression, suicidal ideas, hallucinations, memory loss and substance abuse. The patient is not nervous/anxious and does not have insomnia.     Blood pressure 136/93, pulse 76, temperature 98.6 F (37 C), temperature source Oral, resp. rate 20, height 5\' 4"  (1.626 m), weight 86.637 kg (191 lb).Body mass index is 32.77 kg/(m^2).  General Appearance: Disheveled  Eye Solicitor::  Fair  Speech:  Clear and Coherent  Volume:  Normal  Mood:  Anxious and Depressed  Affect:  Congruent  Thought Process:  Goal Directed  Orientation:  Full (Time, Place, and Person)   Thought Content:  Hallucinations: Auditory  Suicidal Thoughts:  No  Homicidal Thoughts:  No  Memory:  Immediate;   Fair  Judgement:  Intact  Insight:  Present  Psychomotor Activity:  Decreased  Concentration:  Poor  Recall:  Fair  Akathisia:  No  Handed:  Right  AIMS (if indicated):     Assets:  Communication Skills  Sleep:  Number of Hours: 6.5   Current Medications: Current Facility-Administered Medications  Medication Dose Route Frequency Provider Last Rate Last Dose  . acetaminophen (TYLENOL) tablet 650 mg  650 mg Oral Q6H PRN Kerry Hough, PA-C   650 mg at 09/08/12 1832  . albuterol (PROVENTIL HFA;VENTOLIN HFA) 108 (90 BASE) MCG/ACT inhaler 2 puff  2 puff Inhalation Q6H PRN Kerry Hough, PA-C      . alum & mag hydroxide-simeth (MAALOX/MYLANTA) 200-200-20 MG/5ML suspension 30 mL  30 mL Oral Q4H PRN Kerry Hough, PA-C      . atenolol (TENORMIN) tablet 100 mg  100 mg Oral Daily Kerry Hough, PA-C   100 mg at 09/09/12 4098  . benzonatate (TESSALON) capsule 100 mg  100 mg Oral BID Kerry Hough, PA-C   100 mg at 09/09/12 1713  . buPROPion (WELLBUTRIN SR) 12 hr tablet 150 mg  150 mg Oral BID Kerry Hough, PA-C   150 mg at 09/09/12 1714  . chlordiazePOXIDE (LIBRIUM) capsule 25 mg  25 mg Oral Q6H PRN Kerry Hough, PA-C      . chlordiazePOXIDE (LIBRIUM) capsule 25 mg  25 mg Oral QID Sanjuana Kava, NP   25 mg at 09/09/12 1715   Followed by  . [  START ON 09/10/2012] chlordiazePOXIDE (LIBRIUM) capsule 25 mg  25 mg Oral TID Sanjuana Kava, NP       Followed by  . [START ON 09/11/2012] chlordiazePOXIDE (LIBRIUM) capsule 25 mg  25 mg Oral BH-qamhs Sanjuana Kava, NP       Followed by  . [START ON 09/13/2012] chlordiazePOXIDE (LIBRIUM) capsule 25 mg  25 mg Oral Daily Sanjuana Kava, NP      . Melene Muller ON 09/10/2012] clonazePAM (KLONOPIN) tablet 0.5 mg  0.5 mg Oral Once Sanjuana Kava, NP      . clonazePAM Scarlette Calico) tablet 1 mg  1 mg Oral BID Kerry Hough, PA-C   1 mg at 09/09/12  1717  . hydrALAZINE (APRESOLINE) tablet 25 mg  25 mg Oral Q8H [name], MD   25 mg at 09/09/12 1437  . hydrochlorothiazide (HYDRODIURIL) tablet 25 mg  25 mg Oral Daily Kerry Hough, PA-C   25 mg at 09/09/12 4782  . hydrOXYzine (ATARAX/VISTARIL) tablet 25 mg  25 mg Oral Q6H PRN Kerry Hough, PA-C      . lamoTRIgine (LAMICTAL) tablet 150 mg  150 mg Oral Daily Kerry Hough, PA-C   150 mg at 09/09/12 9562  . loperamide (IMODIUM) capsule 2-4 mg  2-4 mg Oral PRN Kerry Hough, PA-C      . loratadine (CLARITIN) tablet 10 mg  10 mg Oral Daily Kerry Hough, PA-C   10 mg at 09/09/12 1308  . magnesium hydroxide (MILK OF MAGNESIA) suspension 30 mL  30 mL Oral Daily PRN Kerry Hough, PA-C      . multivitamin with minerals tablet 1 tablet  1 tablet Oral Daily Kerry Hough, PA-C   1 tablet at 09/09/12 6578  . nicotine (NICODERM CQ - dosed in mg/24 hours) patch 14 mg  14 mg Transdermal Q0600 Kerry Hough, PA-C   14 mg at 09/09/12 4696  . ondansetron (ZOFRAN-ODT) disintegrating tablet 4 mg  4 mg Oral Q6H PRN Kerry Hough, PA-C   4 mg at 09/08/12 2952  . Oxcarbazepine (TRILEPTAL) tablet 300 mg  300 mg Oral BID Sanjuana Kava, NP   300 mg at 09/09/12 1716  . pantoprazole (PROTONIX) EC tablet 40 mg  40 mg Oral QHS Kerry Hough, PA-C   40 mg at 09/08/12 2214  . risperiDONE (RISPERDAL) tablet 3 mg  3 mg Oral QHS Kerry Hough, PA-C   3 mg at 09/08/12 2214  . thiamine (B-1) injection 100 mg  100 mg Intramuscular Once [employer], PA-C      . thiamine (VITAMIN B-1) tablet 100 mg  100 mg Oral Daily Kerry Hough, PA-C   100 mg at 09/09/12 0813  . traZODone (DESYREL) tablet 300 mg  300 mg Oral QHS Kerry Hough, PA-C   300 mg at 09/08/12 2214    Lab Results: No results found for this or any previous visit (from the past 48 hour(s)).  Physical Findings: AIMS: Facial and Oral Movements Muscles of Facial Expression: None, normal Lips and Perioral Area: None, normal Jaw: None,  normal Tongue: None, normal,Extremity Movements Upper (arms, wrists, hands, fingers): None, normal Lower (legs, knees, ankles, toes): None, normal, Trunk Movements Neck, shoulders, hips: None, normal, Overall Severity Severity of abnormal movements (highest score from questions above): None, normal Incapacitation due to abnormal movements: None, normal Patient's awareness of abnormal movements (rate only patient's report): No Awareness, Dental Status Current problems with teeth and/or dentures?:  No Does patient usually wear dentures?: No  CIWA:  CIWA-Ar Total: 1 COWS:  COWS Total Score: 1  Treatment Plan Summary: Daily contact with patient to assess and evaluate symptoms and progress in treatment Medication management  Plan: 1. Continue crisis management and stabilization. 2. Medication management to reduce current symptoms to base line and improve patient's overall level of functioning 3. Treat health problems as indicated. 4. Develop treatment plan to decrease risk of relapse upon discharge and the need for     readmission. 5. Psycho-social education regarding relapse prevention and self care. 6. Health care follow up as needed for medical problems. 7. Continue home medications where appropriate. 8. Will add NSAID for joint pain, icepacks as directed. 9. Patient is requesting rehab upon discharge from [facility]. 10: ELOS: 2-3 days   Medical Decision Making Problem Points:  Established problem, stable/improving (1), New problem, with no additional work-up planned (3) and Review of psycho-social stressors (1) Data Points:  Review or order medicine tests (1) Review of medication regiment & side effects (2)  I certify that inpatient services furnished can reasonably be expected to improve the patient's condition.  Rona Ravens. Jerzi Tigert RPAC 5:54 PM 09/09/2012

## 2012-09-09 NOTE — Progress Notes (Signed)
Patient ID: Maureen Swanson, female   DOB: [birth year], 47 y.o.   MRN: [id number] D: pt. Lying in bed, reports depression at "8" of 10.  Pt. Reports that pain level has decreased. She reports a little shaking. Pt. Has a brace to right leg. A: Pt. Encouraged to verbalize concerns. Pt. Encouraged to go to [employer]. Staff will monitor q67min for safety. R: Pt. Is safe on the unit. Pt. Declined karaoke.

## 2012-09-09 NOTE — Progress Notes (Signed)
Patient ID: Maureen Swanson, female   DOB: [birth year], 47 y.o.   MRN: [id number] D: pt. Lying in bed, reports depression at "8" of 10, it's coming down some." Pt. Reports she'd been drinking since age 85 and using drugs since 40. Pt. Reports family hx. Of ETOH. Pt. Reports has mad up her mind to quit. A: Writer introduced self to client and reviewed night meds. Staff will monitor q66min for safety. Staff encouraged group. R: Pt. Is safe on the unit. Pt. Did not attend group.

## 2012-09-09 NOTE — Progress Notes (Signed)
Follow up to consultation from 3/26:  Uncontrolled HTN: Reviewed blood pressures from last 24 hours.  No additional changes to blood pressure medications at this time.  Continue current therapy.   Please make sure patient has follow up with orthopedics at time of discharge.    Thank you for this consultation.  Please re-consult if further questions arise.   Renae Fickle [phone]

## 2012-09-09 NOTE — Progress Notes (Signed)
[facility] LCSW Aftercare Discharge Planning Group Note  09/09/2012 10:49 AM  Participation Quality:  Appropriate  Affect:  Depressed  Cognitive:  Appropriate  Insight:  Engaged  Engagement in Group:  Engaged  Modes of Intervention:  Discussion, Problem-solving, Socialization and Support  Summary of Progress/Problems: Myya attended group this morning and reported that she had been admitted to [facility] due to depression, increased agitation, anxiety, and "feeling lost in life." She admitted to alcohol and crack cocaine abuse in an attempt to self-medicate. She reports anxiety and depression as 8, no SI/HI, no AVH. She is interested in [facility] but was informed that CSW had not been able to get patients into this facility in years. Pt willing to discuss other options. She had been admitted to [facility], [facility], and [facility] in the past and would like to use [facility] as a "plan B." She reports that she goes to [facility] for therapy and med management and would like to continue with this agency. Patient reports that she does not have access to transportation at this time.   Smart, Heather N 09/09/2012, 10:49 AM

## 2012-09-10 DIAGNOSIS — F1910 Other psychoactive substance abuse, uncomplicated: Secondary | ICD-10-CM

## 2012-09-10 DIAGNOSIS — F1994 Other psychoactive substance use, unspecified with psychoactive substance-induced mood disorder: Secondary | ICD-10-CM

## 2012-09-10 DIAGNOSIS — F411 Generalized anxiety disorder: Secondary | ICD-10-CM

## 2012-09-10 MED ORDER — METHOCARBAMOL 500 MG PO TABS
500.0000 mg | ORAL_TABLET | Freq: Three times a day (TID) | ORAL | Status: DC
Start: 2012-09-10 — End: 2012-09-10
  Administered 2012-09-10: 500 mg via ORAL
  Filled 2012-09-10 (×6): qty 1

## 2012-09-10 MED ORDER — METHOCARBAMOL 500 MG PO TABS
500.0000 mg | ORAL_TABLET | Freq: Three times a day (TID) | ORAL | Status: DC
Start: 2012-09-10 — End: 2012-09-12
  Administered 2012-09-10 – 2012-09-12 (×5): 500 mg via ORAL
  Filled 2012-09-10 (×9): qty 1

## 2012-09-10 NOTE — Progress Notes (Signed)
[facility] INPATIENT:  Family/Significant Other Suicide Prevention Education  Suicide Prevention Education:  Contact Attempts:  has been identified by the patient as the family member/significant other with whom the patient will be residing, and identified as the person(s) who will aid the patient in the event of a mental health crisis.  With written consent from the patient, two attempts were made to provide suicide prevention education, prior to and/or following the patient's discharge.  We were unsuccessful in providing suicide prevention education.  A suicide education pamphlet was given to the patient to share with family/significant other.  Maureen Swanson was not suicidal prior to or at time of admission.  SPE not done with family.  Daryel Gerald B 09/10/2012, 3:04 PM

## 2012-09-10 NOTE — Progress Notes (Signed)
[facility] LCSW Aftercare Discharge Planning Group Note  09/10/2012 1:32 PM  Participation Quality:  Appropriate  Affect:  Flat  Cognitive:  Oriented  Insight:  Limited  Engagement in Group:  Improving  Modes of Intervention:  Discussion, Exploration and Socialization  Summary of Progress/Problems:Maureen Swanson appeared depressed, but a bit brighter than yesterday.  Stated her new plan is to return home, follow up outpt and stay sober using "will power."  This led to a discussion among she and other group members about the effectiveness of will power vs getting support from AA mtgs and sponsers.  Members were split, but it gave [name] something to think about. Likely d/c Monday.  Daryel Gerald B 09/10/2012, 1:32 PM

## 2012-09-10 NOTE — Progress Notes (Signed)
D: Patient denies SI/HI and A/V hallucinations; patient reports sleep is fair; reports appetite is improving ; reports energy level is normal ; reports ability to pay attention is improving; rates depression as 8/10; rates hopelessness 8/10; rates anxiety as 8/10;   A: Monitored q 15 minutes; patient encouraged to attend groups; patient educated about medications; patient given medications per physician orders; patient encouraged to express feelings and/or concerns  R: Patient is very quite and isolative but assertive; patient is very flat and sad; patient's interaction with staff and peers is appropriate; ; patient is taking medications as prescribed and tolerating medications; patient is attending all groups

## 2012-09-10 NOTE — Progress Notes (Signed)
Patient did not attend the evening karaoke group. Pt was informed about group but remained in her bed sleeping.

## 2012-09-10 NOTE — Progress Notes (Signed)
[facility] MD Progress Note  09/10/2012 3:25 PM Maureen Swanson  MRN:  [id number] Subjective:  7/10 depression and anxiety, denies suicidal ideations.sleep "good" but feels tired today, appetite "fair", denies signs and symptoms of withdrawal on assessment.  Robaxin was added to help her right knee pain.  Maureen Swanson plans to go to NA meetings after discharge to maintain her sobriety and see her therapist and Child psychotherapist.   Diagnosis:   Axis I: Anxiety Disorder NOS, Substance Abuse and Substance Induced Mood Disorder Axis II: Deferred Axis III:  Past Medical History  Diagnosis Date  . High cholesterol   . Depression   . Gout   . Anxiety   . Bipolar 1 disorder   . GERD (gastroesophageal reflux disease)   . Substance abuse     crack cocaine  . Headache     migraines  . TMJ (temporomandibular joint disorder)   . Asthma     daily and prn inhalers  . Arthritis     back and knees  . Gastric ulcer   . 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 Hypertension     under control, has been on med. x 12 yrs.  . Diabetes mellitus     diet-controlled   Axis IV: other psychosocial or environmental problems, problems related to social environment and problems with primary support group Axis V: 41-50 serious symptoms  ADL's:  Intact  Sleep: Good  Appetite:  Fair  Suicidal Ideation:  Denies Homicidal Ideation:  Denies AEB (as evidenced by):  Psychiatric Specialty Exam: Review of Systems  Constitutional: Negative.   HENT: Negative.   Eyes: Negative.   Respiratory: Negative.   Cardiovascular: Negative.   Gastrointestinal: Negative.   Genitourinary: Negative.   Musculoskeletal:       Knee pain  Skin: Negative.   Neurological: Negative.   Endo/Heme/Allergies: Negative.   Psychiatric/Behavioral: Positive for depression and substance abuse. The patient is nervous/anxious.     Blood pressure 121/86, pulse 87, temperature 97.8 F (36.6 C), temperature source Oral, resp. rate 16,  height 5\' 4"  (1.626 m), weight 86.637 kg (191 lb).Body mass index is 32.77 kg/(m^2).  General Appearance: Casual  Eye Contact::  Fair  Speech:  Slow  Volume:  Decreased  Mood:  Anxious and Depressed  Affect:  Congruent  Thought Process:  Coherent  Orientation:  Full (Time, Place, and Person)  Thought Content:  WDL  Suicidal Thoughts:  No  Homicidal Thoughts:  No  Memory:  Immediate;   Fair Recent;   Fair Remote;   Fair  Judgement:  Fair  Insight:  Fair  Psychomotor Activity:  Decreased  Concentration:  Fair  Recall:  Fair  Akathisia:  No  Handed:  Right  AIMS (if indicated):     Assets:  Housing Resilience Social Support  Sleep:  Number of Hours: 6   Current Medications: Current Facility-Administered Medications  Medication Dose Route Frequency Provider Last Rate Last Dose  . acetaminophen (TYLENOL) tablet 650 mg  650 mg Oral Q6H PRN Kerry Hough, PA-C   650 mg at 09/08/12 1832  . albuterol (PROVENTIL HFA;VENTOLIN HFA) 108 (90 BASE) MCG/ACT inhaler 2 puff  2 puff Inhalation Q6H PRN Kerry Hough, PA-C      . alum & mag hydroxide-simeth (MAALOX/MYLANTA) 200-200-20 MG/5ML suspension 30 mL  30 mL Oral Q4H PRN Kerry Hough, PA-C      . atenolol (TENORMIN) tablet 100 mg  100 mg Oral Daily Kerry Hough,  PA-C   100 mg at 09/10/12 0824  . benzonatate (TESSALON) capsule 100 mg  100 mg Oral BID Kerry Hough, PA-C   100 mg at 09/10/12 0865  . buPROPion (WELLBUTRIN SR) 12 hr tablet 150 mg  150 mg Oral BID Kerry Hough, PA-C   150 mg at 09/10/12 7846  . chlordiazePOXIDE (LIBRIUM) capsule 25 mg  25 mg Oral Q6H PRN Kerry Hough, PA-C      . chlordiazePOXIDE (LIBRIUM) capsule 25 mg  25 mg Oral TID Sanjuana Kava, NP   25 mg at 09/10/12 1300   Followed by  . [START ON 09/11/2012] chlordiazePOXIDE (LIBRIUM) capsule 25 mg  25 mg Oral BH-qamhs Sanjuana Kava, NP       Followed by  . [START ON 09/13/2012] chlordiazePOXIDE (LIBRIUM) capsule 25 mg  25 mg Oral Daily Sanjuana Kava,  NP      . hydrALAZINE (APRESOLINE) tablet 25 mg  25 mg Oral Q8H [name], MD   25 mg at 09/10/12 1438  . hydrochlorothiazide (HYDRODIURIL) tablet 25 mg  25 mg Oral Daily Kerry Hough, PA-C   25 mg at 09/10/12 9629  . hydrOXYzine (ATARAX/VISTARIL) tablet 25 mg  25 mg Oral Q6H PRN Kerry Hough, PA-C      . lamoTRIgine (LAMICTAL) tablet 150 mg  150 mg Oral Daily Kerry Hough, PA-C   150 mg at 09/10/12 5284  . loperamide (IMODIUM) capsule 2-4 mg  2-4 mg Oral PRN Kerry Hough, PA-C      . loratadine (CLARITIN) tablet 10 mg  10 mg Oral Daily Kerry Hough, PA-C   10 mg at 09/10/12 1324  . magnesium hydroxide (MILK OF MAGNESIA) suspension 30 mL  30 mL Oral Daily PRN Kerry Hough, PA-C      . methocarbamol (ROBAXIN) tablet 500 mg  500 mg Oral TID Nanine Means, NP      . multivitamin with minerals tablet 1 tablet  1 tablet Oral Daily Kerry Hough, PA-C   1 tablet at 09/10/12 4010  . naproxen (NAPROSYN) tablet 500 mg  500 mg Oral BID WC Gwen Her, RPH   500 mg at 09/09/12 1837  . nicotine (NICODERM CQ - dosed in mg/24 hours) patch 14 mg  14 mg Transdermal Q0600 Kerry Hough, PA-C   14 mg at 09/10/12 0630  . ondansetron (ZOFRAN-ODT) disintegrating tablet 4 mg  4 mg Oral Q6H PRN Kerry Hough, PA-C   4 mg at 09/08/12 2725  . Oxcarbazepine (TRILEPTAL) tablet 300 mg  300 mg Oral BID Sanjuana Kava, NP   300 mg at 09/10/12 0824  . pantoprazole (PROTONIX) EC tablet 40 mg  40 mg Oral QHS Kerry Hough, PA-C   40 mg at 09/09/12 2332  . risperiDONE (RISPERDAL) tablet 3 mg  3 mg Oral QHS Kerry Hough, PA-C   3 mg at 09/09/12 2334  . thiamine (B-1) injection 100 mg  100 mg Intramuscular Once [employer], PA-C      . thiamine (VITAMIN B-1) tablet 100 mg  100 mg Oral Daily Kerry Hough, PA-C   100 mg at 09/10/12 3664  . traZODone (DESYREL) tablet 300 mg  300 mg Oral QHS Kerry Hough, PA-C   300 mg at 09/09/12 2334    Lab Results: No results found for this or any  previous visit (from the past 48 hour(s)).  Physical Findings: AIMS: Facial and Oral Movements Muscles of Facial  Expression: None, normal Lips and Perioral Area: None, normal Jaw: None, normal Tongue: None, normal,Extremity Movements Upper (arms, wrists, hands, fingers): None, normal Lower (legs, knees, ankles, toes): None, normal, Trunk Movements Neck, shoulders, hips: None, normal, Overall Severity Severity of abnormal movements (highest score from questions above): None, normal Incapacitation due to abnormal movements: None, normal Patient's awareness of abnormal movements (rate only patient's report): No Awareness, Dental Status Current problems with teeth and/or dentures?: No Does patient usually wear dentures?: No  CIWA:  CIWA-Ar Total: 0 COWS:  COWS Total Score: 1  Treatment Plan Summary: Daily contact with patient to assess and evaluate symptoms and progress in treatment Medication management  Plan:  Review of chart, vital signs, medications, and notes. 1-Individual and group therapy 2-Medication management for depression, alcohol withdrawal, and anxiety:  Medications reviewed with the patient and Robaxin ordered to assist her right knee pain 3-Coping skills for depression, anxiety, and substance abuse 4-Continue crisis stabilization and management 5-Address health issues--monitoring vital signs, stable 6-Treatment plan in progress to prevent relapse of depression, substance abuse, and anxiety  Medical Decision Making Problem Points:  Established problem, stable/improving (1) and Review of psycho-social stressors (1) Data Points:  Review of new medications or change in dosage (2)  I certify that inpatient services furnished can reasonably be expected to improve the patient's condition.   Nanine Means, PMH-NP 09/10/2012, 3:25 PM

## 2012-09-10 NOTE — Progress Notes (Signed)
[facility] LCSW Group Therapy  09/10/2012 1:15 PM  Type of Therapy:  Group Therapy 1:15 to 2:30 PM  Participation Level: Minimal  Participation Quality: Attentive  Affect:  Flat  Cognitive: Oriented  Insight: Unknown  Engagement in Therapy: Present  Modes of Intervention: Discussion, clarification, exploration  Summary of Progress/Problems: Focus of discussion was feelings about relapse; last portion of group session included an educational portion on Post Acute Withdrawal Syndrome (PAWS).  Patients were able to process the information and feelings related to relapse. Maureen Swanson choose to remain quiet during entire group.     Clide Dales

## 2012-09-10 NOTE — Progress Notes (Signed)
Adult Psychoeducational Group Note  Date:  09/10/2012 Time:  11:42 AM  Group Topic/Focus:  Recovery Goals:   The focus of this group is to identify appropriate goals for recovery and establish a plan to achieve them.  Participation Level:  Active  Participation Quality:  Appropriate  Affect:  Appropriate  Cognitive:  Alert and Appropriate  Insight: Appropriate  Engagement in Group:  Engaged  Modes of Intervention:  Discussion  Additional Comments:  Pt. Was attentive and appropriate during today's group discussion. Pt. Was able to talk about relapse prevention and coping skills. Pt. Stated that she is learning to communicate with support team and also keep herself busy when she is craving or having a urge to do something she is not suppose to do.   Maureen Swanson D 09/10/2012, 11:42 AM

## 2012-09-10 NOTE — Progress Notes (Signed)
Adult Psychoeducational Group Note  Date:  09/10/2012 Time:  10:57 AM  Group Topic/Focus:  Coping With Mental Health Crisis:   The purpose of this group is to help patients identify strategies for coping with mental health crisis.  Group discusses possible causes of crisis and ways to manage them effectively.  Participation Level:  Minimal  Participation Quality:  Attentive  Affect:  Blunted  Cognitive:  Alert  Insight: Lacking  Engagement in Group:  Engaged  Modes of Intervention:  Activity and Discussion  Additional Comments:  Pt said she is here for alcohol abuse.  Crews Mccollam T 09/10/2012, 10:57 AM

## 2012-09-11 DIAGNOSIS — F1410 Cocaine abuse, uncomplicated: Secondary | ICD-10-CM

## 2012-09-11 MED ORDER — CHLORDIAZEPOXIDE HCL 25 MG PO CAPS
ORAL_CAPSULE | ORAL | Status: AC
  Filled 2012-09-11: qty 1

## 2012-09-11 NOTE — Progress Notes (Signed)
Report received from [facility]. Writer entered patient's room and observed patient lying in bed appeared to be asleep but moved in the bed as Clinical research associate entered the room. Patient voiced no complaints, no distress noted, safety maintained on unit, will continue to monitor.

## 2012-09-11 NOTE — Progress Notes (Signed)
D patient has been very quiet today, not saying much hardly answering questions asked of her, denies Si or HI and no AVH at this time, did not attend group nor did she participate, taking meds as ordered by MD and going to DR for meals, appetite is good. A q33min safety checks continue and support offered patient, encouraged to attend group and participate. R patient remains safe on the unit

## 2012-09-11 NOTE — Progress Notes (Signed)
I certify that inpatient services furnished can reasonably be expected to improve the patient's condition. Latronda Spink, MD, [facility]  

## 2012-09-11 NOTE — Progress Notes (Signed)
[facility] MD Progress Note  09/11/2012 2:46 PM SUMMAR MCGLOTHLIN  MRN:  [id number] Subjective:  Maureen Swanson presents asking for something to help with her left knee pain. She has been taking the Robaxin and naproxen as ordered, but has not asked for any Tylenol. When asked what the source of her knee pain was she was unable to answer. She thinks it might be arthritis. She endorses that she is depressed and rates her depression as a 9 on a scale of 1-10 where 10 is the worst. She also rates her anxiety as a 9 on a scale of 1-10. She denies any suicidal or homicidal ideation. She denies any auditory or visual hallucinations. She endorses good sleep and fair appetite. She is rather ambivalent about continued care for substance abuse.  Diagnosis:   Axis I: Alcohol abuse with intoxication, Alcohol dependence, Cocaine abuse, Major depression Axis II: Deferred Axis III:  Past Medical History  Diagnosis Date  . High cholesterol   . Depression   . Gout   . Anxiety   . Bipolar 1 disorder   . GERD (gastroesophageal reflux disease)   . Substance abuse     crack cocaine  . Headache     migraines  . TMJ (temporomandibular joint disorder)   . Asthma     daily and prn inhalers  . Arthritis     back and knees  . Gastric ulcer   . 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 Hypertension     under control, has been on med. x 12 yrs.  . Diabetes mellitus     diet-controlled    ADL's:  Impaired  Sleep: Good  Appetite:  Fair  Suicidal Ideation:  Patient denies any thought, plan, or intent Homicidal Ideation:  Patient denies any thought, plan, or intent AEB (as evidenced by):  Psychiatric Specialty Exam: Review of Systems  Constitutional: Negative.   HENT: Negative.   Eyes: Positive for blurred vision.  Respiratory: Negative.   Cardiovascular: Negative.   Gastrointestinal: Negative.   Genitourinary: Negative.   Musculoskeletal: Positive for joint pain.  Skin: Negative.    Endo/Heme/Allergies: Negative.   Psychiatric/Behavioral: Positive for depression and substance abuse. Negative for suicidal ideas, hallucinations and memory loss. The patient is nervous/anxious. The patient does not have insomnia.     Blood pressure 131/98, pulse 96, temperature 98 F (36.7 C), temperature source Oral, resp. rate 21, height 5\' 4"  (1.626 m), weight 86.637 kg (191 lb).Body mass index is 32.77 kg/(m^2).  General Appearance: Disheveled  Eye Solicitor::  Fair  Speech:  Garbled and Slurred  Volume:  Decreased  Mood:  Depressed  Affect:  Congruent  Thought Process:  Irrelevant  Orientation:  Full (Time, Place, and Person)  Thought Content:  WDL  Suicidal Thoughts:  No  Homicidal Thoughts:  No  Memory:  Immediate;   Good Recent;   Good Remote;   Good  Judgement:  Impaired  Insight:  Lacking  Psychomotor Activity:  Psychomotor Retardation  Concentration:  Fair  Recall:  Good  Akathisia:  No  Handed:  Right  AIMS (if indicated):     Assets:  Resilience  Sleep:  Number of Hours: 6.25   Current Medications: Current Facility-Administered Medications  Medication Dose Route Frequency Provider Last Rate Last Dose  . acetaminophen (TYLENOL) tablet 650 mg  650 mg Oral Q6H PRN Kerry Hough, PA-C   650 mg at 09/11/12 0645  . albuterol (PROVENTIL HFA;VENTOLIN HFA) 108 (90 BASE) MCG/ACT  inhaler 2 puff  2 puff Inhalation Q6H PRN Kerry Hough, PA-C      . alum & mag hydroxide-simeth (MAALOX/MYLANTA) 200-200-20 MG/5ML suspension 30 mL  30 mL Oral Q4H PRN Kerry Hough, PA-C      . atenolol (TENORMIN) tablet 100 mg  100 mg Oral Daily Kerry Hough, PA-C   100 mg at 09/11/12 0830  . benzonatate (TESSALON) capsule 100 mg  100 mg Oral BID Kerry Hough, PA-C   100 mg at 09/11/12 0830  . buPROPion (WELLBUTRIN SR) 12 hr tablet 150 mg  150 mg Oral BID Kerry Hough, PA-C   150 mg at 09/11/12 0831  . chlordiazePOXIDE (LIBRIUM) capsule 25 mg  25 mg Oral BH-qamhs Sanjuana Kava, NP        Followed by  . [START ON 09/13/2012] chlordiazePOXIDE (LIBRIUM) capsule 25 mg  25 mg Oral Daily Sanjuana Kava, NP      . hydrALAZINE (APRESOLINE) tablet 25 mg  25 mg Oral Q8H [name], MD   25 mg at 09/11/12 1431  . hydrochlorothiazide (HYDRODIURIL) tablet 25 mg  25 mg Oral Daily Kerry Hough, PA-C   25 mg at 09/11/12 0830  . lamoTRIgine (LAMICTAL) tablet 150 mg  150 mg Oral Daily Kerry Hough, PA-C   150 mg at 09/11/12 0830  . loratadine (CLARITIN) tablet 10 mg  10 mg Oral Daily Kerry Hough, PA-C   10 mg at 09/11/12 0830  . magnesium hydroxide (MILK OF MAGNESIA) suspension 30 mL  30 mL Oral Daily PRN Kerry Hough, PA-C      . methocarbamol (ROBAXIN) tablet 500 mg  500 mg Oral TID Rachael Fee, MD   500 mg at 09/11/12 1201  . multivitamin with minerals tablet 1 tablet  1 tablet Oral Daily Kerry Hough, PA-C   1 tablet at 09/11/12 0830  . naproxen (NAPROSYN) tablet 500 mg  500 mg Oral BID WC Gwen Her, RPH   500 mg at 09/11/12 0830  . nicotine (NICODERM CQ - dosed in mg/24 hours) patch 14 mg  14 mg Transdermal Q0600 Kerry Hough, PA-C   14 mg at 09/11/12 1610  . Oxcarbazepine (TRILEPTAL) tablet 300 mg  300 mg Oral BID Sanjuana Kava, NP   300 mg at 09/11/12 0830  . pantoprazole (PROTONIX) EC tablet 40 mg  40 mg Oral QHS Kerry Hough, PA-C   40 mg at 09/10/12 2124  . risperiDONE (RISPERDAL) tablet 3 mg  3 mg Oral QHS Kerry Hough, PA-C   3 mg at 09/10/12 2124  . thiamine (B-1) injection 100 mg  100 mg Intramuscular Once [employer], PA-C      . thiamine (VITAMIN B-1) tablet 100 mg  100 mg Oral Daily Kerry Hough, PA-C   100 mg at 09/11/12 0830  . traZODone (DESYREL) tablet 300 mg  300 mg Oral QHS Kerry Hough, PA-C   300 mg at 09/10/12 2124    Lab Results: No results found for this or any previous visit (from the past 48 hour(s)).  Physical Findings: AIMS: Facial and Oral Movements Muscles of Facial Expression: None, normal Lips and Perioral  Area: None, normal Jaw: None, normal Tongue: None, normal,Extremity Movements Upper (arms, wrists, hands, fingers): None, normal Lower (legs, knees, ankles, toes): None, normal, Trunk Movements Neck, shoulders, hips: None, normal, Overall Severity Severity of abnormal movements (highest score from questions above): None, normal Incapacitation due to abnormal movements:  None, normal Patient's awareness of abnormal movements (rate only patient's report): No Awareness, Dental Status Current problems with teeth and/or dentures?: No Does patient usually wear dentures?: No  CIWA:  CIWA-Ar Total: 0 COWS:  COWS Total Score: 1  Treatment Plan Summary: Daily contact with patient to assess and evaluate symptoms and progress in treatment Medication management  Plan: We will continue her current plan of care, she is encouraged to ask Tylenol for pain. We will research options for continued treatment for substance abuse.  Medical Decision Making Problem Points:  Established problem, stable/improving (1), Review of last therapy session (1) and Review of psycho-social stressors (1) Data Points:  Review and summation of old records (2) Review of medication regiment & side effects (2)  I certify that inpatient services furnished can reasonably be expected to improve the patient's condition.   Banessa Mao 09/11/2012, 2:46 PM

## 2012-09-11 NOTE — Progress Notes (Signed)
Adult Psychoeducational Group Note  Date:  09/11/2012 Time:  2:15PM  Group Topic/Focus:  Goals Group:   The focus of this group is to help patients establish daily goals to achieve during treatment and discuss how the patient can incorporate goal setting into their daily lives to aide in recovery.  Participation Level:  Minimal  Participation Quality:  Appropriate, Attentive and Resistant  Affect:  Anxious, Appropriate, Blunted, Defensive and Depressed  Cognitive:  Alert, Appropriate and Oriented  Insight: Lacking  Engagement in Group:  Defensive, Developing/Improving and Lacking  Modes of Intervention:  Clarification, Discussion, Education, Exploration, Problem-solving and Support  Additional Comments:  Pt. Said little, but seemed to be listening.  Maureen Swanson

## 2012-09-11 NOTE — Progress Notes (Signed)
I certify that inpatient services furnished can reasonably be expected to improve the patient's condition. Kire Ferg, MD, [facility]  

## 2012-09-11 NOTE — Progress Notes (Signed)
Adult Psychoeducational Group Note  Date:  09/11/2012 Time:  0900  Group Topic/Focus:  Self inventory tool.  Participation Level:  Did Not Attend  Participation Quality:    Affect:    Cognitive:    Insight:   Engagement in Group:    Modes of Intervention:    Additional Comments:    Roselee Culver 09/11/2012, 12:15 PM

## 2012-09-11 NOTE — Clinical Social Work Note (Signed)
[facility] Group Notes: (Clinical Social Work)   09/11/2012      Type of Therapy:  Group Therapy   Participation Level:  Did Not Attend    Ambrose Mantle, LCSW 09/11/2012, 11:41 AM

## 2012-09-12 MED ORDER — METAXALONE 400 MG HALF TABLET
400.0000 mg | ORAL_TABLET | Freq: Three times a day (TID) | ORAL | Status: DC
  Administered 2012-09-12 – 2012-09-13 (×4): 400 mg via ORAL
  Filled 2012-09-12 (×9): qty 1

## 2012-09-12 MED ORDER — TRAZODONE HCL 50 MG PO TABS
150.0000 mg | ORAL_TABLET | Freq: Every day | ORAL | Status: DC
  Administered 2012-09-12: 150 mg via ORAL
  Filled 2012-09-12: qty 1
  Filled 2012-09-12: qty 42
  Filled 2012-09-12: qty 1

## 2012-09-12 NOTE — Progress Notes (Signed)
Writer spoke with patient who c/o left knee pain and received 2 tylenol for pain she rated a 9. Patient has flat affect. Patient was not in the wheelchair and was walking slowly today as if in pain. Patient requested all of her hs scheduled medications at this time and writer informed her that it was too soon. Patient reports to having an ok day but did not elaborate on this. Writer offered support and encouragement, she denies si/hi/a/v hallucinations. Safety maintained on unit with 15 min checks, will continue to monitor.

## 2012-09-12 NOTE — Progress Notes (Signed)
Adult Psychoeducational Group Note  Date:  09/12/2012 Time:  0900  Group Topic/Focus:  Self inventory sheets  Participation Level:  Active  Participation Quality:  Appropriate  Affect:  Appropriate  Cognitive:  Appropriate  Insight: Appropriate   Engagement in Group:slept    Modes of Intervention:  Support  Additional Comments:  slept  Roselee Culver 09/12/2012, 12:18 PM

## 2012-09-12 NOTE — Progress Notes (Signed)
Writer spoke with patient at medication window. She requested tylenol for left knee pain she rated a 9. Patient reports that her day had been ok and her knee bothers her off and on. Patient requested her hs medications also. Writer re-educated patient on falls and encouraged her to use the call bell if needing to use the bathroom at night. Patient was observed sitting in the dayroom earlier watching tv but little to no interaction with peers. She attended AA group this evening. Patient denies si/hi/v hallucinations but does have auditory hallucinations she reports. Support and encouragement offered, no withdrawal signs noted, safety maintained with 15 min checks, will continue to monitor.

## 2012-09-12 NOTE — Progress Notes (Signed)
MHT L. Squires Artist that patient had fallen in the bathroom. This was an un-witnessed fall. Patient was assessed and a neuro check done. Patients vital signs at 0525 was bp-131/89, pulse -88 temp-97.9 and respirations 16. Patient reports that her left knee and back were aching and rated pain at a 9. Writer assessed her left knee and no swelling or tenderness noted. Patient did not have her knee brace on at time of fall. Writer placed call bell within reach, heat pack applied to left knee and 2 tylenol given.  Doctor on call, Dr. Dan Humphreys was notified and order received to hold am [employer]. Patient requested her friend [name] be notified. Fall protocol followed.

## 2012-09-12 NOTE — Clinical Social Work Note (Signed)
[facility] Group Notes:  (Clinical Social Work)  09/12/2012  10:00-11:00AM  Summary of Progress/Problems:   The main focus of today's process group was to   identify the patient's current support system and decide on other supports that can be put in place to prevent future hospitalizations.  A handout was used to explain the 4 definitions/levels of support and to think about what support patient has given and received from others.  An emphasis was placed on using counselor, doctor, therapy groups, 12-step groups, and problem-specific support groups to expand supports. The patient expressed nothing during group, but rather alternated between appearing to be listening and appearing to be asleep.  Type of Therapy:  Process Group with Motivational Interviewing  Participation Level:  None  Participation Quality:  Attentive and Drowsy  Affect:  Blunted  Cognitive:  Oriented  Insight:  Limited  Engagement in Therapy:  Limited  Modes of Intervention:  Clarification, Education, Limit-setting, Problem-solving, Socialization, Support and Processing, Exploration, Discussion, Role-Play   Ambrose Mantle, LCSW 09/12/2012, 12:18 PM

## 2012-09-12 NOTE — Progress Notes (Signed)
[facility] Group Notes:  (Nursing/MHT/Case Management/Adjunct)  Date:09/11/2012   Time:  2100 Type of Therapy:  wrap up group  Participation Level:  Active  Participation Quality:  Appropriate and Attentive  Affect:  Appropriate  Cognitive:  Appropriate  Insight:  Good  Engagement in Group:  Engaged  Modes of Intervention:  Clarification, Education and Support  Summary of Progress/Problems:  Maureen Swanson 09/12/2012, 12:55 AM

## 2012-09-12 NOTE — Progress Notes (Signed)
[facility] MD Progress Note  09/12/2012 9:39 AM JENAN ELLEGOOD  MRN:  [id number] Subjective:  Rushie is in a wheelchair today. She reports that she fell while trying to use the bathroom this morning. She presents as being overly medicated with drooping eyelids an extremely slurred speech. She continues to endorse auditory hallucinations inside her head telling him to hurt other people. She denies any visual hallucinations. She denies any suicidal or homicidal ideation. She rates her depression as an 8 on a scale of 1-10 where 10 is the worst. She rates her anxiety as a 9. She denies any cravings or withdrawal symptoms. She reports that she slept well last night. She ate part of her breakfast this morning.  Diagnosis:   Axis I: Alcohol abuse with intoxication, Alcohol dependence, Cocaine abuse, Major depression Axis II: Deferred Axis III:  Past Medical History  Diagnosis Date  . High cholesterol   . Depression   . Gout   . Anxiety   . Bipolar 1 disorder   . GERD (gastroesophageal reflux disease)   . Substance abuse     crack cocaine  . Headache     migraines  . TMJ (temporomandibular joint disorder)   . Asthma     daily and prn inhalers  . Arthritis     back and knees  . Gastric ulcer   . 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 Hypertension     under control, has been on med. x 12 yrs.  . Diabetes mellitus     diet-controlled    ADL's:  Impaired  Sleep: Good  Appetite:  Fair  Suicidal Ideation:  Patient denies any thought, plan, or intent Homicidal Ideation:  Patient denies any thought, plan, or intent  AEB (as evidenced by):  Psychiatric Specialty Exam: Review of Systems  Constitutional: Negative.   HENT: Negative.   Eyes: Negative.   Respiratory: Negative.   Cardiovascular: Negative.   Gastrointestinal: Negative.   Genitourinary: Negative.   Musculoskeletal: Negative.   Skin: Negative.   Neurological: Negative.   Endo/Heme/Allergies: Negative.    Psychiatric/Behavioral: Positive for depression, hallucinations and substance abuse. Negative for suicidal ideas. The patient is nervous/anxious. The patient does not have insomnia.     Blood pressure 103/68, pulse 85, temperature 97.6 F (36.4 C), temperature source Oral, resp. rate 18, height 5\' 4"  (1.626 m), weight 86.637 kg (191 lb).Body mass index is 32.77 kg/(m^2).  General Appearance: Disheveled  Eye Contact::  Minimal  Speech:  Garbled and Slurred  Volume:  Decreased  Mood:  Depressed  Affect:  Congruent  Thought Process:  Logical  Orientation:  Full (Time, Place, and Person)  Thought Content:  WDL  Suicidal Thoughts:  No  Homicidal Thoughts:  No  Memory:  Immediate;   Fair Recent;   Fair Remote;   Fair  Judgement:  Impaired  Insight:  Lacking  Psychomotor Activity:  Psychomotor Retardation  Concentration:  Fair  Recall:  Good  Akathisia:  No  Handed:  Right  AIMS (if indicated):     Assets:  Resilience  Sleep:  Number of Hours: 6.25   Current Medications: Current Facility-Administered Medications  Medication Dose Route Frequency Provider Last Rate Last Dose  . acetaminophen (TYLENOL) tablet 650 mg  650 mg Oral Q6H PRN Kerry Hough, PA-C   650 mg at 09/12/12 8295  . albuterol (PROVENTIL HFA;VENTOLIN HFA) 108 (90 BASE) MCG/ACT inhaler 2 puff  2 puff Inhalation Q6H PRN Kerry Hough, PA-C      .  alum & mag hydroxide-simeth (MAALOX/MYLANTA) 200-200-20 MG/5ML suspension 30 mL  30 mL Oral Q4H PRN Kerry Hough, PA-C      . atenolol (TENORMIN) tablet 100 mg  100 mg Oral Daily Kerry Hough, PA-C   100 mg at 09/12/12 1610  . benzonatate (TESSALON) capsule 100 mg  100 mg Oral BID Kerry Hough, PA-C   100 mg at 09/12/12 9604  . buPROPion [facility] SR) 12 hr tablet 150 mg  150 mg Oral BID Kerry Hough, PA-C   150 mg at 09/12/12 5409  . hydrALAZINE (APRESOLINE) tablet 25 mg  25 mg Oral Q8H [name], MD   25 mg at 09/11/12 2210  . hydrochlorothiazide  (HYDRODIURIL) tablet 25 mg  25 mg Oral Daily Kerry Hough, PA-C   25 mg at 09/12/12 8119  . lamoTRIgine (LAMICTAL) tablet 150 mg  150 mg Oral Daily Kerry Hough, PA-C   150 mg at 09/12/12 1478  . loratadine (CLARITIN) tablet 10 mg  10 mg Oral Daily Kerry Hough, PA-C   10 mg at 09/12/12 2956  . magnesium hydroxide (MILK OF MAGNESIA) suspension 30 mL  30 mL Oral Daily PRN Kerry Hough, PA-C      . metaxalone [facility]) tablet 400 mg  400 mg Oral TID Jorje Guild, PA-C      . multivitamin with minerals tablet 1 tablet  1 tablet Oral Daily Kerry Hough, PA-C   1 tablet at 09/12/12 2130  . naproxen (NAPROSYN) tablet 500 mg  500 mg Oral BID WC Gwen Her, RPH   500 mg at 09/12/12 8657  . nicotine (NICODERM CQ - dosed in mg/24 hours) patch 14 mg  14 mg Transdermal Q0600 Kerry Hough, PA-C   14 mg at 09/12/12 8469  . Oxcarbazepine (TRILEPTAL) tablet 300 mg  300 mg Oral BID Sanjuana Kava, NP   300 mg at 09/12/12 6295  . pantoprazole (PROTONIX) EC tablet 40 mg  40 mg Oral QHS Kerry Hough, PA-C   40 mg at 09/11/12 2211  . risperiDONE (RISPERDAL) tablet 3 mg  3 mg Oral QHS Kerry Hough, PA-C   3 mg at 09/11/12 2210  . thiamine (B-1) injection 100 mg  100 mg Intramuscular Once [employer], PA-C      . thiamine (VITAMIN B-1) tablet 100 mg  100 mg Oral Daily Kerry Hough, PA-C   100 mg at 09/12/12 2841  . traZODone (DESYREL) tablet 150 mg  150 mg Oral QHS Jorje Guild, PA-C        Lab Results: No results found for this or any previous visit (from the past 48 hour(s)).  Physical Findings: AIMS: Facial and Oral Movements Muscles of Facial Expression: None, normal Lips and Perioral Area: None, normal Jaw: None, normal Tongue: None, normal,Extremity Movements Upper (arms, wrists, hands, fingers): None, normal Lower (legs, knees, ankles, toes): None, normal, Trunk Movements Neck, shoulders, hips: None, normal, Overall Severity Severity of abnormal movements (highest score  from questions above): None, normal Incapacitation due to abnormal movements: None, normal Patient's awareness of abnormal movements (rate only patient's report): No Awareness, Dental Status Current problems with teeth and/or dentures?: No Does patient usually wear dentures?: No  CIWA:  CIWA-Ar Total: 1 COWS:  COWS Total Score: 3  Treatment Plan Summary: Daily contact with patient to assess and evaluate symptoms and progress in treatment Medication management  Plan: We will replace her Robaxin with Skelaxin as it is less sedating. We  will decrease her trazodone to 150 mg at bedtime. She received her final dose of Librium this morning. If attentiveness does not improve, we will consider reducing her dose of Risperdal. Medical Decision Making Problem Points:  Established problem, worsening (2), New problem, with no additional work-up planned (3), Review of last therapy session (1) and Review of psycho-social stressors (1) Data Points:  Review and summation of old records (2) Review of medication regiment & side effects (2) Review of new medications or change in dosage (2)  I certify that inpatient services furnished can reasonably be expected to improve the patient's condition.   Silvino Selman 09/12/2012, 9:39 AM

## 2012-09-12 NOTE — Progress Notes (Signed)
D patient did not do a self inventory sheet today, fell earlier in the morning hours and has been doing fine since fall, no complaints except for headache, medication w/Tylenol, going to DR for meals and taking meds as ordered by MD, attended group this morning but slept thru most of it. A q45min safety checks continue and support offered patient, encouraged patient to go to group. R patient remains safe on the unit

## 2012-09-13 DIAGNOSIS — F259 Schizoaffective disorder, unspecified: Secondary | ICD-10-CM

## 2012-09-13 MED ORDER — TRAZODONE HCL 150 MG PO TABS: 150.0000 mg | ORAL_TABLET | Freq: Every day | ORAL | Status: DC

## 2012-09-13 MED ORDER — ATENOLOL 100 MG PO TABS: 100.0000 mg | ORAL_TABLET | Freq: Every day | ORAL | Status: DC

## 2012-09-13 MED ORDER — PANTOPRAZOLE SODIUM 40 MG PO TBEC: 40.0000 mg | DELAYED_RELEASE_TABLET | Freq: Every day | ORAL | Status: DC

## 2012-09-13 MED ORDER — RISPERIDONE 3 MG PO TABS: 3.0000 mg | ORAL_TABLET | Freq: Every day | ORAL | Status: DC

## 2012-09-13 MED ORDER — ALBUTEROL SULFATE HFA 108 (90 BASE) MCG/ACT IN AERS: 2.0000 | INHALATION_SPRAY | Freq: Four times a day (QID) | RESPIRATORY_TRACT | Status: DC | PRN

## 2012-09-13 MED ORDER — HYDRALAZINE HCL 25 MG PO TABS: 25.0000 mg | ORAL_TABLET | Freq: Three times a day (TID) | ORAL | Status: DC

## 2012-09-13 MED ORDER — LAMOTRIGINE 150 MG PO TABS: 150.0000 mg | ORAL_TABLET | Freq: Every day | ORAL | Status: DC

## 2012-09-13 MED ORDER — BENZONATATE 100 MG PO CAPS: 100.0000 mg | ORAL_CAPSULE | Freq: Two times a day (BID) | ORAL | Status: DC

## 2012-09-13 MED ORDER — CETIRIZINE HCL 10 MG PO TABS: 10.0000 mg | ORAL_TABLET | Freq: Every day | ORAL | Status: DC

## 2012-09-13 MED ORDER — OXCARBAZEPINE 300 MG PO TABS: 300.0000 mg | ORAL_TABLET | Freq: Two times a day (BID) | ORAL | Status: DC

## 2012-09-13 MED ORDER — BUPROPION HCL ER (SR) 150 MG PO TB12: 150.0000 mg | ORAL_TABLET | Freq: Two times a day (BID) | ORAL | Status: DC

## 2012-09-13 MED ORDER — HYDROCHLOROTHIAZIDE 25 MG PO TABS: 25.0000 mg | ORAL_TABLET | Freq: Every day | ORAL | Status: DC

## 2012-09-13 NOTE — [facility] Suicide Risk Assessment (Signed)
Suicide Risk Assessment  Discharge Assessment     Demographic Factors:  NA  Mental Status Per Nursing Assessment::   On Admission:     Current Mental Status by Physician: In full contact with reality. There are no suicidal ideas plans or intent,. Her mood is euthymic. Her affect is appropriate. She had been drinking and using cocaine. States that her knee pain is a major trigger. She has a follow up appointment for an assessment. She is probably going to have a knee replacement. She is hopeful that this is going to help her. She is committed to abstinence. She lives with a boyfriend who she says is supportive   Loss Factors: Decline in physical health  Historical Factors: NA  Risk Reduction Factors:   Sense of responsibility to family and Living with another person, especially a relative  Continued Clinical Symptoms:  Depression:   Comorbid alcohol abuse/dependence Alcohol/Substance Abuse/Dependencies  Cognitive Features That Contribute To Risk:  Closed-mindedness Thought constriction (tunnel vision)  Suicide Risk:  Minimal: No identifiable suicidal ideation.  Patients presenting with no risk factors but with morbid ruminations; may be classified as minimal risk based on the severity of the depressive symptoms  Discharge Diagnoses:   AXIS I:  Alcohol Dependence, Cocaine Abuse, Major Depressive Disorder AXIS II:  Deferred AXIS III:   Past Medical History  Diagnosis Date  . High cholesterol   . Depression   . Gout   . Anxiety   . Bipolar 1 disorder   . GERD (gastroesophageal reflux disease)   . Substance abuse     crack cocaine  . Headache     migraines  . TMJ (temporomandibular joint disorder)   . Asthma     daily and prn inhalers  . Arthritis     back and knees  . Gastric ulcer   . 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 Hypertension     under control, has been on med. x 12 yrs.  . Diabetes mellitus     diet-controlled   AXIS IV:  other  psychosocial or environmental problems and problems with access to health care services AXIS V:  61-70 mild symptoms  Plan Of Care/Follow-up recommendations:  Activity:  as tolerated Diet:  regular Continue follow up outpatient basis Is patient on multiple antipsychotic therapies at discharge:  No   Has Patient had three or more failed trials of antipsychotic monotherapy by history:  No  Recommended Plan for Multiple Antipsychotic Therapies: N/A   Natilie Krabbenhoft A 09/13/2012, 1:08 PM

## 2012-09-13 NOTE — Progress Notes (Signed)
Pt was discharged home today via taxi.  She denied any S/I H/I or A/V hallucinations.    She was given f/u appointment, rx, sample medications,and  hotline info booklet.  She voiced understanding to all instructions provided.  She declined the need for smoking cessation materials.

## 2012-09-13 NOTE — Progress Notes (Signed)
MHT notified writer at 0330 that patient was lying in the floor beside her bed reporting that she had fallen. Writer entered patients room and observed patient in the floor with her scrub top on and no undergarments or scrub bottoms. Patient had wet her bed and reported that she was trying to go to the bathroom and did not make it in time.Writer asked patient if she hit her head and she reported that she did not. Patients vitals were taken and bp- 161/118, pulse-85 respirations 16. Patient was assisted to wheelchair and assisted with dressing. Patient has call bell in place and wheelchair by bedside and did not use either. Patient had been re-educated earlier in the shift and encouraged to use the call bell if getting up to use the restroom after lying down but she did not comply.  Patient reported sharp pain in her legs and patient showed no signs of distress and received 2 tylenol. Patient was assessed and no bruising, swelling, tenderness or breaks/tears noted. Doctor on call notified of fall and order received for patient to be placed on a 1:1 for fall safety. Post fall huddle done. 1:1 in place.

## 2012-09-13 NOTE — Tx Team (Signed)
Interdisciplinary Treatment Plan Update (Adult)  Date: 09/13/2012  Time Reviewed: 9:56 AM   Progress in Treatment: Attending groups: Yes Participating in groups: Yes Taking medication as prescribed:  Yes Tolerating medication:  Yes Family/Significant othe contact made: Yes Patient understands diagnosis: Yes Discussing patient identified problems/goals with staff: Yes Medical problems stabilized or resolved:  Yes Denies suicidal/homicidal ideation: Yes Patient has not harmed self or Others: Yes  New problem(s) identified: None Identified  Discharge Plan or Barriers: Patient will foolow up at [facility] the [place]  Additional comments: N/A  Reason for Continuation of Hospitalization: NA  Estimated length of stay: Discharging today  For review of initial/current patient goals, please see plan of care.  Attendees: Patient:     Family:     Physician:  Geoffery Lyons 09/13/2012 9:56 AM   Nursing:   Roswell Miners, RN 09/13/2012 9:56 AM   Clinical Social Worker Ronda Fairly 09/13/2012 9:56 AM   Other:  Robbie Louis, RN 09/13/2012 9:56 AM   Other:  Serena Colonel, NP 09/13/2012 9:56 AM   Other:   09/13/2012 9:56 AM   Other:   09/13/2012 9:56 AM    Scribe for Treatment Team:   Carney Bern, LCSWA  09/13/2012 9:56 AM

## 2012-09-13 NOTE — Discharge Summary (Signed)
Physician Discharge Summary Note  Patient:  Maureen Swanson is an 48 y.o., female MRN:  [id number] DOB:  1965-10-29 Patient phone:  [phone] (home)  Patient address:   [address] [place] [address],   Date of Admission:  09/07/2012  Date of Discharge: 09/13/12  Reason for Admission:  Alcohol intoxication  Discharge Diagnoses: Principal Problem:   Alcohol dependence Active Problems:   Alcohol abuse   Cocaine abuse, episodic   Schizoaffective disorder, depressive type  Review of Systems  Constitutional: Negative.   HENT: Negative.   Eyes: Negative.   Respiratory: Negative.   Cardiovascular: Negative.   Gastrointestinal: Negative.   Genitourinary: Negative.   Musculoskeletal: Positive for joint pain.  Skin: Negative.   Neurological: Negative.   Endo/Heme/Allergies: Negative.   Psychiatric/Behavioral: Positive for depression (Stabilized with medication prior to discharge) and substance abuse (Hx. alcoholism). Negative for suicidal ideas, hallucinations and memory loss. The patient has insomnia (Stabilized with medication prior to discharge). The patient is not nervous/anxious.    Axis Diagnosis:   AXIS I:  Alcohol Abuse and Schizoaffective disorder, depressive type, Cocaine abuse AXIS II:  Deferred AXIS III:   Past Medical History  Diagnosis Date  . High cholesterol   . Depression   . Gout   . Anxiety   . Bipolar 1 disorder   . GERD (gastroesophageal reflux disease)   . Substance abuse     crack cocaine  . Headache     migraines  . TMJ (temporomandibular joint disorder)   . Asthma     daily and prn inhalers  . Arthritis     back and knees  . Gastric ulcer   . 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 Hypertension     under control, has been on med. x 12 yrs.  . Diabetes mellitus     diet-controlled   AXIS IV:  other psychosocial or environmental problems and Substance abuse AXIS V:  63  Level of Care:  OP  Hospital  Course:  This is an admission assessment for this 47 year old African-American female. Admitted to [facility] from the [facility] ED with complaints of excruciating left knee pain and alcohol intoxication. Patient reports, "The ambulance took me to the [facility] ED yesterday because my left knee was hurting and throbbing so bad. The ED physicians checked my blood and found out that I have a lot of alcohol in my blood. They recommended me to come here for detoxification treatment. I know that I have alcohol problems. I have been drinking since the age of 64. I drink 3 (40 oz) bottles of liquor daily. I have only been sober x 2 years in the past. And that was in 2010 and 2011. Alcohol has caused me quite much in my life. I have lost my residence, jobs and personal relationships. And yet, alcohol helps me deal with the issues and problems that I don't want to face or think about. It calms me and helps me with my sleep. Alcohol also makes me very sick and ill when I am withdrawing from it. I have had blackouts, the most recent one was January this year 2014. I am also depressed. My depression is at #8. I don't feel good about myself. I have low self esteem. I also use cocaine as well".  After admission assessment and evaluation, it was determined that Maureen Swanson will need detoxification treatment protocol to stabilize her system of alcohol  intoxication and to combat the withdrawal symptoms of alcohol as well. And her discharge plans included a referral/appointement to an outpatient psychiatric clinic for routine psychiatric care, medication management substance abuse treatment and counseling sessions. Maureen Swanson was then started on Librium protocol for her alcohol detoxification. She was also enrolled in group counseling sessions and activities to learn coping skills that should help her after discharge to cope better, manage her substance abuse problems to maintain a much longer sobriety. She was also  enrolled/attended AA/NA meetings being offered and held on this unit.  Besides the detoxification treatment protocol, patient also received Gabapentin 100 mg for anxiety, Wellbutrin SR 150 mg Q daily for depression and Lamictal 150 mg for mood stabilization, Risperdal 3 mg for mood control, Trileptal for mood stabilization/alcohol withdrawal seizures and Trazodone 150 mg for sleep. Maureen Swanson has some previous and or identifiable medical conditions that required treatment and or monitoring. She received medication management for all those health issues as well, including primary care consult for uncontrolled high blood pressure. She was monitored closely for any potential problems that may arise as a result of result of and or during detoxification treatment. Patient tolerated her treatment regimen and detoxification treatment without any significant adverse effects and or reactions reported. She did sustain a fall without any significant injuries noted and or presented due to an existing left knee pain. She was also placed on 1:1 supervision for safety. A wheel chair was provided for her to assist with her mobility. Patient will be following up with her orthopedic doctor to see to her knee pain after discharge from this hospital.  Patient attended treatment team meeting this am and met with the treatment team members. Her reason for admission, present symptoms, substance abuse issues, response to treatment and discharge plans discussed. Patient endorsed that she is doing well and stable for discharge to pursue the next phase of her substance abuse treatment. It was agreed upon that he will continue substance abuse treatment at the [facility] here in [place], [place]. The address and phone number for this clinic provided for patient in writing.    In addition to an outpatient psychiatric visits,  Maureen Swanson is encouraged to join/attend AA/NA meetings being offered and held within her community. She  is instructed and encouraged to get a trusted sponsor from the advise of others or from whomever within the AA meetings seems to make sense, and who has a proven track record, and will hold her responsible for her sobriety, and both expects and insists on her total  abstinence from alcohol. She must focus the first of each month on the speaker meetings where she will specifically look at how her life has been wrecked by drugs/alcohol and how her life has been similar to that of the speaker's life.   Upon discharge, patient adamantly denies suicidal, homicidal ideations, auditory, visual hallucinations, delusional thinking and or withdrawal symptoms. Patient left [facility] with all personal belongings in no apparent distress. She received 2 weeks worth samples of her [facility] discharge medications. Transportation per family.   Consults:  None  Significant Diagnostic Studies:  labs: CBC with diff, CMP, UDS, Toxicology tests  Discharge Vitals:   Blood pressure 132/87, pulse 97, temperature 98.3 F (36.8 C), temperature source Oral, resp. rate 16, height 5\' 4"  (1.626 m), weight 86.637 kg (191 lb). Body mass index is 32.77 kg/(m^2). Lab Results:   No results found for this or any previous visit (from the past 72 hour(s)).  Physical Findings: AIMS: Facial and Oral Movements Muscles of Facial Expression: None, normal Lips and Perioral Area: None, normal Jaw: None, normal Tongue: None, normal,Extremity Movements Upper (arms, wrists, hands, fingers): None, normal Lower (legs, knees, ankles, toes): None, normal, Trunk Movements Neck, shoulders, hips: None, normal, Overall Severity Severity of abnormal movements (highest score from questions above): None, normal Incapacitation due to abnormal movements: None, normal Patient's awareness of abnormal movements (rate only patient's report): No Awareness, Dental Status Current problems with teeth and/or dentures?: No Does patient usually wear dentures?: No  CIWA:   CIWA-Ar Total: 1 COWS:  COWS Total Score: 3  Psychiatric Specialty Exam: See Psychiatric Specialty Exam and Suicide Risk Assessment completed by Attending Physician prior to discharge.  Discharge destination:  Home  Is patient on multiple antipsychotic therapies at discharge:  No   Has Patient had three or more failed trials of antipsychotic monotherapy by history:  No  Recommended Plan for Multiple Antipsychotic Therapies: NA     Medication List    STOP taking these medications       clonazePAM 1 MG tablet  Commonly known as:  KLONOPIN     Diclofenac Sodium CR 100 MG 24 hr tablet      TAKE these medications     Indication   albuterol 108 (90 BASE) MCG/ACT inhaler  Commonly known as:  PROVENTIL HFA;VENTOLIN HFA  Inhale 2 puffs into the lungs every 6 (six) hours as needed for wheezing or shortness of breath.   Indication:  Asthma     atenolol 100 MG tablet  Commonly known as:  TENORMIN  Take 1 tablet (100 mg total) by mouth daily. For HTN   Indication:  High Blood Pressure     benzonatate 100 MG capsule  Commonly known as:  TESSALON  Take 1 capsule (100 mg total) by mouth 2 (two) times daily. For cough   Indication:  Cough     buPROPion 150 MG 12 hr tablet  Commonly known as:  WELLBUTRIN SR  Take 1 tablet (150 mg total) by mouth 2 (two) times daily. For depression   Indication:  Major Depressive Disorder     cetirizine 10 MG tablet  Commonly known as:  ZYRTEC  Take 1 tablet (10 mg total) by mouth daily. For allergies   Indication:  Perennial Rhinitis     hydrALAZINE 25 MG tablet  Commonly known as:  APRESOLINE  Take 1 tablet (25 mg total) by mouth every 8 (eight) hours. For HTN   Indication:  High Blood Pressure     hydrochlorothiazide 25 MG tablet  Commonly known as:  HYDRODIURIL  Take 1 tablet (25 mg total) by mouth daily. For blood pressure control.   Indication:  High Blood Pressure     lamoTRIgine 150 MG tablet  Commonly known as:  LAMICTAL  Take 1  tablet (150 mg total) by mouth daily. For mood stabilization   Indication:  Manic-Depression, Depressive Phase of Manic-Depression     Oxcarbazepine 300 MG tablet  Commonly known as:  TRILEPTAL  Take 1 tablet (300 mg total) by mouth 2 (two) times daily. For mood stabilization   Indication:  Manic-Depression     pantoprazole 40 MG tablet  Commonly known as:  PROTONIX  Take 1 tablet (40 mg total) by mouth at bedtime. For acid reflux.   Indication:  Gastroesophageal Reflux Disease     risperiDONE 3 MG tablet  Commonly known as:  RISPERDAL  Take 1 tablet (3 mg total) by mouth at  bedtime. For mood control   Indication:  Manic-Depression     traZODone 150 MG tablet  Commonly known as:  DESYREL  Take 1 tablet (150 mg total) by mouth at bedtime. For depression/sleep   Indication:  Trouble Sleeping       Follow-up Information   Follow up with [facility] of the [place].   Contact information:   [address]   [place]  [place] [address] Ph [phone] FAX [phone]     Follow-up recommendations:  Activity:  As tolerated Diet: As recommended by your primary care doctor. Keep all scheduled follow-up appointments as recommended. Continue to work the relapse prevention plan Comments: Take all your medications as prescribed by your mental healthcare provider. Report any adverse effects and or reactions from your medicines to your outpatient provider promptly. Patient is instructed and cautioned to not engage in alcohol and or illegal drug use while on prescription medicines. In the event of worsening symptoms, patient is instructed to call the crisis hotline, 911 and or go to the nearest ED for appropriate evaluation and treatment of symptoms. Follow-up with your primary care provider for your other medical issues, concerns and or health care needs.    Total Discharge Time:  Greater than 30 minutes.  SignedArmandina Stammer I 09/14/2012, 10:44 AM

## 2012-09-13 NOTE — Progress Notes (Signed)
Adult Psychoeducational Group Note  Date:  09/13/2012 Time:  11:52 AM  Group Topic/Focus:  Self Care:   The focus of this group is to help patients understand the importance of self-care in order to improve or restore emotional, physical, spiritual, interpersonal, and financial health.  Participation Level:  Active  Participation Quality:  Appropriate and Attentive  Affect:  Appropriate  Cognitive:  Alert and Appropriate  Insight: Appropriate  Engagement in Group:  Engaged  Modes of Intervention:  Discussion  Additional Comments:  Pt. Was attentive and appropriate during today's group discussion. Pt. Was able to share about Wellness and Self Care Pt was able to complete work sheet on Self- Care Assessment. Pt. Was stated that she eat regularly and take vacations. However she shared the following areas she need to improve on get enough sleep,take day trips,and improve support circle.   Bing Plume D 09/13/2012, 11:52 AM

## 2012-09-13 NOTE — Progress Notes (Signed)
Adult Psychoeducational Group Note  Date:  09/13/2012 Time:  10:59 AM  Group Topic/Focus:  Personal Choices and Values:   The focus of this group is to help patients assess and explore the importance of values in their lives, how their values affect their decisions, how they express their values and what opposes their expression.  Participation Level:  Minimal  Participation Quality:  Attentive  Affect:  Blunted  Cognitive:  Alert  Insight: Lacking  Engagement in Group:  Limited  Modes of Intervention:  Activity  Additional Comments:  Pt  Is here to work on alcohol abuse and get well.  Kristan Votta T 09/13/2012, 10:59 AM

## 2012-09-13 NOTE — Progress Notes (Signed)
[facility] LCSW Aftercare Discharge Planning Group Note  09/13/2012   Participation Quality: Minimal  Affect:  Flat  Cognitive:  Oriented  Insight:  None Shared   Engagement in Group: None  Modes of Intervention:  Discussion, Exploration. Socialization and Support  Summary of Progress/Problems:  Group discussion focused on what patient's see as their own obstacles to recovery.  Patient was present physically for half the group before MHT took her out to change clothes.  Patient shared nothing before leaving the group.  Clide Dales

## 2012-09-14 NOTE — Progress Notes (Signed)
[facility] Adult Case Management Discharge Plan :  Will you be returning to the same living situation after discharge: Yes,  home At discharge, do you have transportation home?:Yes,  family Do you have the ability to pay for your medications:Yes,  mental health  Release of information consent forms completed and in the chart;  Patient's signature needed at discharge.  Patient to Follow up at: Follow-up Information   Follow up with [facility] of the [place]. (Walk in at 8AM or 1PM M-F for your hospital follow up appointment)    Contact information:   [address]   [place]  [place] [address] Ph [phone] FAX [phone]      Follow up with Attend daily AA mtgs, get a sponser.      Patient denies SI/HI:   Yes,  yes    Safety Planning and Suicide Prevention discussed:  Yes,  yes  Ida Rogue 09/14/2012, 5:20 PM

## 2012-09-14 NOTE — Progress Notes (Signed)
I certify that inpatient services furnished can reasonably be expected to improve the patient's condition. Maurio Baize, MD, MSP[facility]  

## 2012-09-16 NOTE — Progress Notes (Signed)
Patient Discharge Instructions:  After Visit Summary (AVS):   Faxed to:  09/16/12 Discharge Summary Note:   Faxed to:  09/16/12 Psychiatric Admission Assessment Note:   Faxed to:  09/16/12 Suicide Risk Assessment - Discharge Assessment:   Faxed to:  09/16/12 Faxed/Sent to the Next Level Care provider:  09/16/12 Faxed to [facility] of the [place] @ [phone]  Jerelene Redden, 09/16/2012, 4:09 PM

## 2012-10-04 ENCOUNTER — Encounter (HOSPITAL_COMMUNITY): Payer: Self-pay | Admitting: *Deleted

## 2012-12-06 ENCOUNTER — Other Ambulatory Visit: Payer: Self-pay

## 2012-12-06 DIAGNOSIS — Z1231 Encounter for screening mammogram for malignant neoplasm of breast: Secondary | ICD-10-CM

## 2012-12-15 ENCOUNTER — Telehealth (HOSPITAL_COMMUNITY): Payer: Self-pay | Admitting: *Deleted

## 2012-12-15 NOTE — Telephone Encounter (Signed)
Patient called me and left voicemail to renew BCCCP. Called patient back. Patient is scheduled for a screening mammogram tomorrow that she self referred. Patient had a right breast lumpectomy 04/19/12. Patient received results over the phone but either no showed or canceled all follow up appointments. Therefore no recommendation about follow up is noted. Told patient she can use the orange card to cover her mammogram tomorrow. Told her in order for BCCCP to cover mammogram would need to be evaluated by BCCCP prior to mammogram. Gaynelle Adu, Sabrina's number to call to make an appointment for BCCCP. Patient verbalized understanding.

## 2012-12-16 ENCOUNTER — Ambulatory Visit: Payer: Self-pay

## 2013-01-27 ENCOUNTER — Encounter (HOSPITAL_COMMUNITY): Payer: Self-pay | Admitting: *Deleted

## 2013-01-27 ENCOUNTER — Emergency Department (HOSPITAL_COMMUNITY)
Admission: EM | Admit: 2013-01-27 | Discharge: 2013-01-27 | Disposition: A | Discharge status: Other Acute Inpatient Hospital | Payer: Self-pay | Attending: Emergency Medicine | Admitting: Emergency Medicine

## 2013-01-27 ENCOUNTER — Inpatient Hospital Stay (HOSPITAL_COMMUNITY)
Admission: AD | Admit: 2013-01-27 | Discharge: 2013-01-31 | DRG: 885 | Disposition: A | Discharge status: Home / Self Care | Payer: No Typology Code available for payment source | Source: Intra-hospital | Attending: Psychiatry | Admitting: Psychiatry

## 2013-01-27 ENCOUNTER — Ambulatory Visit (HOSPITAL_COMMUNITY): Admission: AD | Admit: 2013-01-27 | Payer: Self-pay | Source: Intra-hospital

## 2013-01-27 DIAGNOSIS — F259 Schizoaffective disorder, unspecified: Principal | ICD-10-CM | POA: Diagnosis present

## 2013-01-27 DIAGNOSIS — Z862 Personal history of diseases of the blood and blood-forming organs and certain disorders involving the immune mechanism: Secondary | ICD-10-CM | POA: Insufficient documentation

## 2013-01-27 DIAGNOSIS — R350 Frequency of micturition: Secondary | ICD-10-CM | POA: Insufficient documentation

## 2013-01-27 DIAGNOSIS — R443 Hallucinations, unspecified: Secondary | ICD-10-CM | POA: Insufficient documentation

## 2013-01-27 DIAGNOSIS — R45851 Suicidal ideations: Secondary | ICD-10-CM | POA: Insufficient documentation

## 2013-01-27 DIAGNOSIS — J45909 Unspecified asthma, uncomplicated: Secondary | ICD-10-CM | POA: Insufficient documentation

## 2013-01-27 DIAGNOSIS — Z8639 Personal history of other endocrine, nutritional and metabolic disease: Secondary | ICD-10-CM | POA: Insufficient documentation

## 2013-01-27 DIAGNOSIS — E119 Type 2 diabetes mellitus without complications: Secondary | ICD-10-CM | POA: Insufficient documentation

## 2013-01-27 DIAGNOSIS — F251 Schizoaffective disorder, depressive type: Secondary | ICD-10-CM

## 2013-01-27 DIAGNOSIS — K219 Gastro-esophageal reflux disease without esophagitis: Secondary | ICD-10-CM | POA: Insufficient documentation

## 2013-01-27 DIAGNOSIS — Z8719 Personal history of other diseases of the digestive system: Secondary | ICD-10-CM | POA: Insufficient documentation

## 2013-01-27 DIAGNOSIS — Z3202 Encounter for pregnancy test, result negative: Secondary | ICD-10-CM | POA: Insufficient documentation

## 2013-01-27 DIAGNOSIS — M129 Arthropathy, unspecified: Secondary | ICD-10-CM | POA: Insufficient documentation

## 2013-01-27 DIAGNOSIS — Z87891 Personal history of nicotine dependence: Secondary | ICD-10-CM | POA: Insufficient documentation

## 2013-01-27 DIAGNOSIS — Z79899 Other long term (current) drug therapy: Secondary | ICD-10-CM | POA: Insufficient documentation

## 2013-01-27 DIAGNOSIS — I10 Essential (primary) hypertension: Secondary | ICD-10-CM | POA: Diagnosis present

## 2013-01-27 DIAGNOSIS — F319 Bipolar disorder, unspecified: Secondary | ICD-10-CM | POA: Insufficient documentation

## 2013-01-27 DIAGNOSIS — F411 Generalized anxiety disorder: Secondary | ICD-10-CM | POA: Diagnosis present

## 2013-01-27 DIAGNOSIS — Z8742 Personal history of other diseases of the female genital tract: Secondary | ICD-10-CM | POA: Insufficient documentation

## 2013-01-27 DIAGNOSIS — R300 Dysuria: Secondary | ICD-10-CM | POA: Insufficient documentation

## 2013-01-27 DIAGNOSIS — N390 Urinary tract infection, site not specified: Secondary | ICD-10-CM | POA: Insufficient documentation

## 2013-01-27 DIAGNOSIS — M545 Low back pain, unspecified: Secondary | ICD-10-CM | POA: Insufficient documentation

## 2013-01-27 DIAGNOSIS — M25569 Pain in unspecified knee: Secondary | ICD-10-CM | POA: Diagnosis present

## 2013-01-27 DIAGNOSIS — Z880 Allergy status to penicillin: Secondary | ICD-10-CM | POA: Insufficient documentation

## 2013-01-27 DIAGNOSIS — F1020 Alcohol dependence, uncomplicated: Secondary | ICD-10-CM | POA: Diagnosis present

## 2013-01-27 DIAGNOSIS — R109 Unspecified abdominal pain: Secondary | ICD-10-CM | POA: Insufficient documentation

## 2013-01-27 DIAGNOSIS — F1410 Cocaine abuse, uncomplicated: Secondary | ICD-10-CM | POA: Diagnosis present

## 2013-01-27 LAB — URINALYSIS, ROUTINE W REFLEX MICROSCOPIC
Bilirubin Urine: NEGATIVE
Hgb urine dipstick: NEGATIVE
Ketones, ur: NEGATIVE mg/dL
Protein, ur: NEGATIVE mg/dL
Urobilinogen, UA: 0.2 mg/dL (ref 0.0–1.0)

## 2013-01-27 LAB — RAPID URINE DRUG SCREEN, HOSP PERFORMED
Amphetamines: NOT DETECTED
Benzodiazepines: NOT DETECTED
Opiates: NOT DETECTED
Tetrahydrocannabinol: NOT DETECTED

## 2013-01-27 LAB — COMPREHENSIVE METABOLIC PANEL
ALT: 14 U/L (ref 0–35)
Alkaline Phosphatase: 75 U/L (ref 39–117)
CO2: 25 mEq/L (ref 19–32)
GFR calc Af Amer: 78 mL/min — ABNORMAL LOW (ref >90)
Glucose, Bld: 92 mg/dL (ref 70–99)
Potassium: 3.8 mEq/L (ref 3.5–5.1)
Sodium: 136 mEq/L (ref 135–145)
Total Protein: 7.3 g/dL (ref 6.0–8.3)

## 2013-01-27 LAB — URINE MICROSCOPIC-ADD ON

## 2013-01-27 LAB — PREGNANCY, URINE: Preg Test, Ur: NEGATIVE

## 2013-01-27 LAB — CBC
Hemoglobin: 13.1 g/dL (ref 12.0–15.0)
RBC: 4.27 MIL/uL (ref 3.87–5.11)

## 2013-01-27 MED ORDER — SULFAMETHOXAZOLE-TMP DS 800-160 MG PO TABS: 1.0000 | ORAL_TABLET | Freq: Two times a day (BID) | ORAL | Status: DC

## 2013-01-27 MED ORDER — CETIRIZINE HCL 5 MG/5ML PO SYRP: 10.0000 mg | ORAL_SOLUTION | Freq: Every day | ORAL | Status: DC

## 2013-01-27 MED ORDER — RISPERIDONE 0.5 MG PO TABS: 3.0000 mg | ORAL_TABLET | Freq: Every day | ORAL | Status: DC

## 2013-01-27 MED ORDER — PANTOPRAZOLE SODIUM 40 MG PO TBEC: 40.0000 mg | DELAYED_RELEASE_TABLET | Freq: Every day | ORAL | Status: DC

## 2013-01-27 MED ORDER — HYDROCHLOROTHIAZIDE 25 MG PO TABS: 25.0000 mg | ORAL_TABLET | Freq: Every day | ORAL | Status: DC

## 2013-01-27 MED ORDER — TRAZODONE HCL 50 MG PO TABS: 300.0000 mg | ORAL_TABLET | Freq: Every day | ORAL | Status: DC

## 2013-01-27 MED ORDER — SULFAMETHOXAZOLE-TMP DS 800-160 MG PO TABS
1.0000 | ORAL_TABLET | Freq: Once | ORAL | Status: AC
  Administered 2013-01-27: 1 via ORAL
  Filled 2013-01-27: qty 1

## 2013-01-27 MED ORDER — CETIRIZINE HCL 5 MG/5ML PO SYRP: 10.0000 mg | ORAL_SOLUTION | Freq: Once | ORAL | Status: DC

## 2013-01-27 MED ORDER — CLONIDINE HCL 0.1 MG PO TABS: 0.1000 mg | ORAL_TABLET | Freq: Two times a day (BID) | ORAL | Status: DC

## 2013-01-27 MED ORDER — OXCARBAZEPINE 150 MG PO TABS
150.0000 mg | ORAL_TABLET | Freq: Two times a day (BID) | ORAL | Status: DC
  Filled 2013-01-27 (×2): qty 1

## 2013-01-27 MED ORDER — ATENOLOL 25 MG PO TABS: 100.0000 mg | ORAL_TABLET | Freq: Every day | ORAL | Status: DC

## 2013-01-27 MED ORDER — PANTOPRAZOLE SODIUM 40 MG IV SOLR: 40.0000 mg | Freq: Once | INTRAVENOUS | Status: DC

## 2013-01-27 NOTE — ED Notes (Signed)
Report given to Drinda Butts, Charity fundraiser.  Pt. Transferred to Pod C

## 2013-01-27 NOTE — BH Assessment (Signed)
Pt accepted by Dr. Laury Deep to Dr. Dub Mikes, bed 301-1.Denice Bors, AADC 01/27/2013 7:58 PM

## 2013-01-27 NOTE — ED Notes (Signed)
Consult called to act team

## 2013-01-27 NOTE — ED Provider Notes (Signed)
CSN: [id number]     Arrival date & time 01/27/13  1150 History     First MD Initiated Contact with Patient 01/27/13 1257     Chief Complaint  Patient presents with  . Suicidal  . Flank Pain   (Consider location/radiation/quality/duration/timing/severity/associated sxs/prior Treatment) HPI Comments: 47 year old female presents emergency department with chief complaint of suicidal ideation. Patient states that she has a history of suicidal ideation and previous attempts in the past. Her last attempt was 5 months ago. She states she is currently taking trazodone, Risperdal, Trileptal, Lamictal, without any improvement in her symptoms. She says she hears voices which are telling her to harm herself.    Patient also complains of left lower back pain. She denies chest pain, shortness of breath, abdominal pain, nausea vomiting diarrhea, vaginal symptoms. She does state she has urinary frequency and mild dysuria. She denies trauma.  She denies recent infections.  Patient is a 47 y.o. female presenting with flank pain. The history is provided by the patient.  Flank Pain This is a chronic problem. The current episode started 2 days ago. The problem occurs constantly. The problem has not changed since onset.Pertinent negatives include no chest pain, no abdominal pain, no headaches and no shortness of breath. Nothing aggravates the symptoms. Nothing relieves the symptoms. She has tried nothing for the symptoms.    Past Medical History  Diagnosis Date  . High cholesterol   . Depression   . Gout   . Anxiety   . Bipolar 1 disorder   . GERD (gastroesophageal reflux disease)   . Substance abuse     crack cocaine  . Headache(784.0)     migraines  . TMJ (temporomandibular joint disorder)   . Asthma     daily and prn inhalers  . Arthritis     back and knees  . Gastric ulcer   . 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 Hypertension     under control, has been on med. x 12 yrs.   . Diabetes mellitus     diet-controlled   Past Surgical History  Procedure Laterality Date  . Knee arthroscopy w/ partial medial meniscectomy  05/01/2010    right  . Breast lumpectomy with needle localization  04/19/2012    Procedure: BREAST LUMPECTOMY WITH NEEDLE LOCALIZATION;  Surgeon: Robyne Askew, MD;  Location: [facility];  Service: General;  Laterality: Right;   Family History  Problem Relation Age of Onset  . Diabetes Mother   . Cancer Mother     breast  . Cancer Father    History  Substance Use Topics  . Smoking status: Former Smoker -- 15 years    Quit date: 08/02/2012  . Smokeless tobacco: Never Used     Comment: quit smoking 02/2012  . Alcohol Use: No     Comment: no longer drinking   OB History   Grav Para Term Preterm Abortions TAB SAB Ect Mult Living                 Review of Systems  Constitutional: Negative.   Respiratory: Negative for shortness of breath.   Cardiovascular: Negative.  Negative for chest pain.  Gastrointestinal: Negative.  Negative for abdominal pain, blood in stool, abdominal distention and anal bleeding.  Genitourinary: Positive for dysuria, frequency and flank pain. Negative for enuresis, difficulty urinating, menstrual problem and dyspareunia.  Neurological: Negative for headaches.  Psychiatric/Behavioral: Positive for suicidal ideas and hallucinations. Negative for  confusion.   patient does not have menses and denies pregnancy.  Allergies  Chocolate; Orange; Penicillins; and Tomato  Home Medications   Current Outpatient Rx  Name  Route  Sig  Dispense  Refill  . albuterol (PROVENTIL HFA;VENTOLIN HFA) 108 (90 BASE) MCG/ACT inhaler   Inhalation   Inhale 2 puffs into the lungs every 6 (six) hours as needed for wheezing.         Marland Kitchen atenolol (TENORMIN) 100 MG tablet   Oral   Take 100 mg by mouth daily.         Marland Kitchen buPROPion (WELLBUTRIN SR) 150 MG 12 hr tablet   Oral   Take 150 mg by mouth 2 (two) times  daily.         . cetirizine (ZYRTEC) 10 MG tablet   Oral   Take 10 mg by mouth daily.         . hydrALAZINE (APRESOLINE) 25 MG tablet   Oral   Take 25 mg by mouth 3 (three) times daily.         . hydrochlorothiazide (HYDRODIURIL) 25 MG tablet   Oral   Take 25 mg by mouth daily.         Marland Kitchen ibuprofen (ADVIL,MOTRIN) 200 MG tablet   Oral   Take 600 mg by mouth daily as needed for pain.         Marland Kitchen lamoTRIgine (LAMICTAL) 150 MG tablet   Oral   Take 150 mg by mouth daily.         . Multiple Vitamins-Minerals (MULTIVITAMIN PO)   Oral   Take 1 tablet by mouth daily.         . Oxcarbazepine (TRILEPTAL) 300 MG tablet   Oral   Take 300 mg by mouth 2 (two) times daily.         . pantoprazole (PROTONIX) 40 MG tablet   Oral   Take 40 mg by mouth at bedtime.         . risperiDONE (RISPERDAL) 3 MG tablet   Oral   Take 3 mg by mouth at bedtime.         . traZODone (DESYREL) 150 MG tablet   Oral   Take 300 mg by mouth at bedtime.          BP 114/82  Pulse 56  Temp(Src) 98.2 F (36.8 C) (Oral)  Resp 14  SpO2 97% Physical Exam  Constitutional: She appears well-developed and well-nourished.  HENT:  Head: Normocephalic.  Eyes: Conjunctivae are normal. Pupils are equal, round, and reactive to light.  Neck: Normal range of motion. Neck supple.  Cardiovascular: Normal rate, regular rhythm and normal heart sounds.   Pulmonary/Chest: Effort normal and breath sounds normal.  Abdominal: Soft. Bowel sounds are normal. She exhibits no shifting dullness, no distension, no pulsatile liver, no fluid wave and no abdominal bruit. There is no hepatosplenomegaly, splenomegaly or hepatomegaly. There is no tenderness. There is no rigidity, no rebound, no guarding, no CVA tenderness, no tenderness at McBurney's point and negative Murphy's sign.  Genitourinary:  Vaginal exam declined and deferred  Musculoskeletal: Normal range of motion.    ED Course   Procedures (including  critical care time)  Labs Reviewed  COMPREHENSIVE METABOLIC PANEL - Abnormal; Notable for the following:    Total Bilirubin 0.2 (*)    GFR calc non Af Amer 68 (*)    GFR calc Af Amer 78 (*)    All other components within normal limits  SALICYLATE LEVEL -  Abnormal; Notable for the following:    Salicylate Lvl <2.0 (*)    All other components within normal limits  URINALYSIS, ROUTINE W REFLEX MICROSCOPIC - Abnormal; Notable for the following:    APPearance CLOUDY (*)    Leukocytes, UA LARGE (*)    All other components within normal limits  URINE MICROSCOPIC-ADD ON - Abnormal; Notable for the following:    Squamous Epithelial / LPF FEW (*)    Bacteria, UA MANY (*)    Casts HYALINE CASTS (*)    All other components within normal limits  URINE CULTURE  CBC  ETHANOL  ACETAMINOPHEN LEVEL  URINE RAPID DRUG SCREEN (HOSP PERFORMED)   Results for orders placed during the hospital encounter of 01/27/13  CBC      Result Value Range   WBC 4.8  4.0 - 10.5 K/uL   RBC 4.27  3.87 - 5.11 MIL/uL   Hemoglobin 13.1  12.0 - 15.0 g/dL   HCT 16.1  09.6 - 04.5 %   MCV 89.7  78.0 - 100.0 fL   MCH 30.7  26.0 - 34.0 pg   MCHC 34.2  30.0 - 36.0 g/dL   RDW 40.9  81.1 - 91.4 %   Platelets 188  150 - 400 K/uL  COMPREHENSIVE METABOLIC PANEL      Result Value Range   Sodium 136  135 - 145 mEq/L   Potassium 3.8  3.5 - 5.1 mEq/L   Chloride 100  96 - 112 mEq/L   CO2 25  19 - 32 mEq/L   Glucose, Bld 92  70 - 99 mg/dL   BUN 15  6 - 23 mg/dL   Creatinine, Ser 7.82  0.50 - 1.10 mg/dL   Calcium 95.6  8.4 - 21.3 mg/dL   Total Protein 7.3  6.0 - 8.3 g/dL   Albumin 4.0  3.5 - 5.2 g/dL   AST 27  0 - 37 U/L   ALT 14  0 - 35 U/L   Alkaline Phosphatase 75  39 - 117 U/L   Total Bilirubin 0.2 (*) 0.3 - 1.2 mg/dL   GFR calc non Af Amer 68 (*) >90 mL/min   GFR calc Af Amer 78 (*) >90 mL/min  ETHANOL      Result Value Range   Alcohol, Ethyl (B) <11  0 - 11 mg/dL  ACETAMINOPHEN LEVEL      Result Value Range    Acetaminophen (Tylenol), Serum <15.0  10 - 30 ug/mL  SALICYLATE LEVEL      Result Value Range   Salicylate Lvl <2.0 (*) 2.8 - 20.0 mg/dL  URINE RAPID DRUG SCREEN (HOSP PERFORMED)      Result Value Range   Opiates NONE DETECTED  NONE DETECTED   Cocaine NONE DETECTED  NONE DETECTED   Benzodiazepines NONE DETECTED  NONE DETECTED   Amphetamines NONE DETECTED  NONE DETECTED   Tetrahydrocannabinol NONE DETECTED  NONE DETECTED   Barbiturates NONE DETECTED  NONE DETECTED  URINALYSIS, ROUTINE W REFLEX MICROSCOPIC      Result Value Range   Color, Urine YELLOW  YELLOW   APPearance CLOUDY (*) CLEAR   Specific Gravity, Urine 1.012  1.005 - 1.030   pH 5.0  5.0 - 8.0   Glucose, UA NEGATIVE  NEGATIVE mg/dL   Hgb urine dipstick NEGATIVE  NEGATIVE   Bilirubin Urine NEGATIVE  NEGATIVE   Ketones, ur NEGATIVE  NEGATIVE mg/dL   Protein, ur NEGATIVE  NEGATIVE mg/dL   Urobilinogen, UA 0.2  0.0 -  1.0 mg/dL   Nitrite NEGATIVE  NEGATIVE   Leukocytes, UA LARGE (*) NEGATIVE  URINE MICROSCOPIC-ADD ON      Result Value Range   Squamous Epithelial / LPF FEW (*) RARE   WBC, UA 21-50  <3 WBC/hpf   RBC / HPF 0-2  <3 RBC/hpf   Bacteria, UA MANY (*) RARE   Casts HYALINE CASTS (*) NEGATIVE   Urine-Other TRICHOMONAS PRESENT       No results found. 1. Suicidal ideation   2. UTI (lower urinary tract infection)     MDM  47 year old female with history of suicidal ideation and previous attempt presents with suicidal ideation. Patient is medically cleared at this time plan to consult psychiatry for admission and placement.   we'll treat for UTI as patient has symptoms of UTI and urine consistent with urinary tract infection.  Vital signs stable and she is in no acute distress.  Darlys Gales, MD 01/27/13 [phone]

## 2013-01-27 NOTE — Progress Notes (Signed)
Wynetta Emery, MHT completed supportive paperwork for patient who has been accepted to [facility]. Supportive paper work is inclusive of face sheet,consent for treatment, consent to release, and utilization review. Patient admission status is voluntary. Brittney, RN has been provided original support paper work and copies have been faxed to [facility]. Brittney, RN to arranged for transport via security.

## 2013-01-27 NOTE — ED Notes (Signed)
Diet tray ordered.  Spoke with tonya.

## 2013-01-27 NOTE — ED Provider Notes (Signed)
Pt to transfer [facility] for SI, and UTI.  Rx for PO bactrim given.    1. Suicidal ideation   2. UTI (lower urinary tract infection)      Shanna Cisco, MD 01/28/13 [phone]

## 2013-01-27 NOTE — ED Notes (Addendum)
Patient has a very flat affect. She states she has been at [facility] since Monday. She has been clean for the past 2 weeks from alcohol and crack. States she started hearing voices last night. She told her counselor this morning about them. States the voices are telling her to hurt herself and to hurt others, she does have a history of bipolar and si. States she did cut herself a year ago in a suicide attempt. States the voices are telling her to do these things but she doesn't want to hurt herself or anyone else. States she thinks her medicine might need to be adjusted. Pt is calm and cooperative.

## 2013-01-27 NOTE — ED Notes (Signed)
Pt transported to [facility] with security and sitter. Pts belongings sent with security.

## 2013-01-27 NOTE — ED Notes (Signed)
Pt awaiting transfer to [facility]. Pt will be given nighttime meds at that time per pt request.

## 2013-01-27 NOTE — ED Notes (Signed)
Informed charge RN of pt, called security to wand pt and called [facility] for a sitter, does not have a sitter available at this time

## 2013-01-27 NOTE — BH Assessment (Signed)
Tele Assessment Note   Maureen Swanson is an 47 y.o. female presented to [facility] accompanied by staff of [facility]. Pt reports hearing voices that are telling her to "take a knife and cut myself and hurt other people, cut them too". Pt is oriented x's 3 and does not think she needs to be in the hospital "I think I can go back to [facility], but if you think I need to go in the hospital, it ok". Pt confirms that she has been to [facility] approximately 3 times, her last admission was 09/07/12. Pt reports that she has been at [facility] "since Monday". Pt confirms that  she has been drinking again for 2 weeks, drinking 3-4 40 oz beer daily and smoking crack cocaine ongoing for many years and her last use was "last Friday". Pt reported a hx of blackouts, last one occurring 6 months ago but denies hx of seizures. Pt reports nausea and anxiety as current withdrawal sx. Pt stated that she has Bipolar Disorder and is treated at [facility] [place]. Pt was seen at [facility] in 02/2012 and diagnosed with Schizoaffective Disorder, ETOH Abuse and Cocaine Abuse. Pt stated she takes her medications as prescribed, however, pt has a hx of medication noncompliance. Pt stated that her "voices have gotten better". but the voices still tell her she is no good and worthless. Pt stated she has been having racing thoughts. Pt appeared delayed in her responses. Pt was calm and cooperative. Pt is a poor historian and reports she has had multiple inpatient hospitalizations and has been treated on an outpatient basis in the past. Denice Bors, AADC 01/27/2013 10:45 PM  Per previous notes: pt has had trouble in her relationship with her partner, but pt currently denies this. She does admit to a hx of abuse. Pt endorses depressive sx and anxiety.   Axis I: Schizoaffective Disorder Axis II: Deferred Axis III:  Past Medical History  Diagnosis Date  . High cholesterol   . Depression   . Gout   . Anxiety   . Bipolar 1 disorder   .  GERD (gastroesophageal reflux disease)   . Substance abuse     crack cocaine  . Headache(784.0)     migraines  . TMJ (temporomandibular joint disorder)   . Asthma     daily and prn inhalers  . Arthritis     back and knees  . Gastric ulcer   . 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 Hypertension     under control, has been on med. x 12 yrs.  . Diabetes mellitus     diet-controlled   Axis IV: economic problems, housing problems, occupational problems, other psychosocial or environmental problems, problems related to social environment, problems with access to health care services and problems with primary support group Axis V: 31-40 impairment in reality testing  Past Medical History:  Past Medical History  Diagnosis Date  . High cholesterol   . Depression   . Gout   . Anxiety   . Bipolar 1 disorder   . GERD (gastroesophageal reflux disease)   . Substance abuse     crack cocaine  . Headache(784.0)     migraines  . TMJ (temporomandibular joint disorder)   . Asthma     daily and prn inhalers  . Arthritis     back and knees  . Gastric ulcer   . 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  Hypertension     under control, has been on med. x 12 yrs.  . Diabetes mellitus     diet-controlled    Past Surgical History  Procedure Laterality Date  . Knee arthroscopy w/ partial medial meniscectomy  05/01/2010    right  . Breast lumpectomy with needle localization  04/19/2012    Procedure: BREAST LUMPECTOMY WITH NEEDLE LOCALIZATION;  Surgeon: Robyne Askew, MD;  Location: [facility];  Service: General;  Laterality: Right;    Family History:  Family History  Problem Relation Age of Onset  . Diabetes Mother   . Cancer Mother     breast  . Cancer Father     Social History:  reports that she quit smoking about 5 months ago. She has never used smokeless tobacco. She reports that she uses illicit drugs (Cocaine) about once per  week. She reports that she does not drink alcohol.  Additional Social History:     CIWA: CIWA-Ar BP: 97/66 mmHg Pulse Rate: 66 COWS:    Allergies:  Allergies  Allergen Reactions  . Chocolate Hives  . Orange Hives    "Acid foods"  . Penicillins Hives  . Tomato Hives    "acid foods"    Home Medications:  (Not in a hospital admission)  OB/GYN Status:  No LMP recorded. Patient is postmenopausal.  General Assessment Data Admission Status: Voluntary           Risk to self Is patient at risk for suicide?: Yes Substance abuse history and/or treatment for substance abuse?: Yes        Mental Status Report Motor Activity: Freedom of movement                        Values / Beliefs Cultural Requests During Hospitalization: None Spiritual Requests During Hospitalization: None              Disposition: Pt accepted by Dr. Laury Deep to Dr. Dub Mikes.    Manual Meier 01/27/2013 10:31 PM

## 2013-01-27 NOTE — ED Notes (Addendum)
Pt is staying at [facility] for crack cocaine and etoh abuse.  She is bipolar and she has been hearing voices off and on for several weeks telling her to hurt herself and others. One night the voice told her to cut herself with a knife and another voice told her to take an axe to people and cut them with knives.  Pt with flat affect.    Pt also c/o left flank pain and difficulty urinating.

## 2013-01-28 ENCOUNTER — Encounter (HOSPITAL_COMMUNITY): Payer: Self-pay

## 2013-01-28 DIAGNOSIS — F259 Schizoaffective disorder, unspecified: Principal | ICD-10-CM

## 2013-01-28 DIAGNOSIS — F1020 Alcohol dependence, uncomplicated: Secondary | ICD-10-CM

## 2013-01-28 LAB — URINE CULTURE

## 2013-01-28 MED ORDER — LAMOTRIGINE 100 MG PO TABS
150.0000 mg | ORAL_TABLET | Freq: Every day | ORAL | Status: DC
  Administered 2013-01-28 – 2013-01-31 (×4): 150 mg via ORAL
  Filled 2013-01-28 (×5): qty 1

## 2013-01-28 MED ORDER — BUPROPION HCL ER (SR) 150 MG PO TB12
150.0000 mg | ORAL_TABLET | Freq: Two times a day (BID) | ORAL | Status: DC
  Administered 2013-01-28 – 2013-01-30 (×5): 150 mg via ORAL
  Filled 2013-01-28 (×9): qty 1

## 2013-01-28 MED ORDER — OXCARBAZEPINE 300 MG PO TABS
300.0000 mg | ORAL_TABLET | Freq: Two times a day (BID) | ORAL | Status: DC
  Administered 2013-01-28 – 2013-01-31 (×7): 300 mg via ORAL
  Filled 2013-01-28 (×9): qty 1

## 2013-01-28 MED ORDER — TRAZODONE HCL 150 MG PO TABS
300.0000 mg | ORAL_TABLET | Freq: Every day | ORAL | Status: DC
  Administered 2013-01-28 (×2): 300 mg via ORAL
  Filled 2013-01-28 (×2): qty 2
  Filled 2013-01-28: qty 3
  Filled 2013-01-28: qty 6
  Filled 2013-01-28 (×2): qty 2

## 2013-01-28 MED ORDER — IBUPROFEN 600 MG PO TABS: 600.0000 mg | ORAL_TABLET | Freq: Three times a day (TID) | ORAL | Status: DC | PRN

## 2013-01-28 MED ORDER — ALBUTEROL SULFATE HFA 108 (90 BASE) MCG/ACT IN AERS: 2.0000 | INHALATION_SPRAY | Freq: Four times a day (QID) | RESPIRATORY_TRACT | Status: DC | PRN

## 2013-01-28 MED ORDER — MAGNESIUM HYDROXIDE 400 MG/5ML PO SUSP: 30.0000 mL | Freq: Every day | ORAL | Status: DC | PRN

## 2013-01-28 MED ORDER — SULFAMETHOXAZOLE-TMP DS 800-160 MG PO TABS
1.0000 | ORAL_TABLET | Freq: Two times a day (BID) | ORAL | Status: DC
  Administered 2013-01-28 – 2013-01-31 (×6): 1 via ORAL
  Filled 2013-01-28 (×8): qty 1

## 2013-01-28 MED ORDER — LORATADINE 10 MG PO TABS
10.0000 mg | ORAL_TABLET | Freq: Every day | ORAL | Status: DC
  Administered 2013-01-28 – 2013-01-31 (×4): 10 mg via ORAL
  Filled 2013-01-28 (×5): qty 1

## 2013-01-28 MED ORDER — RISPERIDONE 3 MG PO TABS
3.0000 mg | ORAL_TABLET | Freq: Every day | ORAL | Status: DC
  Administered 2013-01-28 (×2): 3 mg via ORAL
  Filled 2013-01-28 (×5): qty 1

## 2013-01-28 MED ORDER — ALUM & MAG HYDROXIDE-SIMETH 200-200-20 MG/5ML PO SUSP: 30.0000 mL | ORAL | Status: DC | PRN

## 2013-01-28 MED ORDER — ACETAMINOPHEN 325 MG PO TABS: 650.0000 mg | ORAL_TABLET | Freq: Four times a day (QID) | ORAL | Status: DC | PRN

## 2013-01-28 MED ORDER — ADULT MULTIVITAMIN W/MINERALS CH
1.0000 | ORAL_TABLET | Freq: Every day | ORAL | Status: DC
  Administered 2013-01-28 – 2013-01-31 (×4): 1 via ORAL
  Filled 2013-01-28 (×5): qty 1

## 2013-01-28 MED ORDER — PANTOPRAZOLE SODIUM 40 MG PO TBEC
40.0000 mg | DELAYED_RELEASE_TABLET | Freq: Every day | ORAL | Status: DC
  Administered 2013-01-28 – 2013-01-30 (×4): 40 mg via ORAL
  Filled 2013-01-28 (×6): qty 1

## 2013-01-28 MED ORDER — ATENOLOL 50 MG PO TABS
100.0000 mg | ORAL_TABLET | Freq: Every day | ORAL | Status: DC
  Administered 2013-01-28 – 2013-01-31 (×4): 100 mg via ORAL
  Filled 2013-01-28 (×5): qty 1

## 2013-01-28 MED ORDER — HYDROCHLOROTHIAZIDE 25 MG PO TABS
25.0000 mg | ORAL_TABLET | Freq: Every day | ORAL | Status: DC
  Administered 2013-01-28 – 2013-01-31 (×4): 25 mg via ORAL
  Filled 2013-01-28 (×5): qty 1

## 2013-01-28 MED ORDER — HYDRALAZINE HCL 25 MG PO TABS
25.0000 mg | ORAL_TABLET | Freq: Three times a day (TID) | ORAL | Status: DC
  Administered 2013-01-28 – 2013-01-31 (×10): 25 mg via ORAL
  Filled 2013-01-28 (×14): qty 1

## 2013-01-28 NOTE — [facility] Suicide Risk Assessment (Signed)
[facility] INPATIENT:  Family/Significant Other Suicide Prevention Education  Suicide Prevention Education:  Patient Refusal for Family/Significant Other Suicide Prevention Education: The patient Maureen Swanson has refused to provide written consent for family/significant other to be provided Family/Significant Other Suicide Prevention Education during admission and/or prior to discharge.  Physician notified.  SPE completed with [name]. SPI pamphlet provided to her and she was encouraged to ask questions and talk about concerns relating to SPE.  Smart, Sible Straley 01/28/2013, 3:34 PM
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
## 2013-01-28 NOTE — [facility] Group Notes (Signed)
[facility] LCSW Group Therapy  01/28/2013 1:50 PM  Type of Therapy:  Group Therapy  Participation Level:  Active  Participation Quality:  Attentive and Sharing  Affect:  Appropriate  Cognitive:  Alert and Oriented  Insight:  Engaged  Engagement in Therapy:  Engaged  Modes of Intervention:  Discussion, Education, Exploration, Socialization and Support  Summary of Progress/Problems: Feelings around Relapse. Group members discussed the meaning of relapse and shared personal stories of relapse, how it affected them and others, and how they perceived themselves during this time. Group members were encouraged to identify triggers, warning signs and coping skills used when facing the possibility of relapse. Social supports were discussed and explored in detail. Post Acute Withdrawal Syndrome (handout provided) was introduced and examined. Pt's were encouraged to ask questions, talk about key points associated with PAWS, and process this information in terms of relapse prevention. Miosha stated that she felt hopeful after coming to [facility] for treatment. She reported that she had been at [facility] for a few days but had been taken to the hospital by staff after experiencing AH. She reported that she had 2 years of sobriety at one point in the past and explored how she was able to manage this. She identified triggers--"when I get in fights with my boyfriend and when I get bored" for alcohol use. Pt asked about her diagnosis of schizoaffective disorder and CSW explained what this diagnosis entailed. Pt shows high level of insight AEB her ability to identify things that she must do to avoid relapse--"I need long term treatment and I need to stay on my medications." CSW and pt reviewed PAWS handout. Pt expressed that it felt good to know that she was not "going crazy" when she experienced PAWS and stated that she would share the information with her significant other.    Smart, Journee Kohen 01/28/2013, 1:50  PM

## 2013-01-28 NOTE — Progress Notes (Signed)
Pt is a 47 year old female admitted with psychotic symptoms   She reports prior to coming in that she was hearing voices telling to hurt herself and others   She did say she is not feeling suicidal or homicidal and knows not to act on the voices   Pt reports a history of ETOH and cocaine abuse and said she was at [facility] for treatment and would like to go back there   She is depressed and sad but cooperative   She reports some problems with her thoughts and reports some racing thoughts   Pt was admitted to the 300 hall and offered nourishment   She was oriented and given medications for the voices as well as sleep   Pt is adjusting well

## 2013-01-28 NOTE — Tx Team (Signed)
Initial Interdisciplinary Treatment Plan  PATIENT STRENGTHS: (choose at least two) General fund of knowledge Motivation for treatment/growth  PATIENT STRESSORS: Health problems Medication change or noncompliance   PROBLEM LIST: Problem List/Patient Goals Date to be addressed Date deferred Reason deferred Estimated date of resolution  Psychotic symptoms voices and has a history of substance abuse                                                       DISCHARGE CRITERIA:  Improved stabilization in mood, thinking, and/or behavior Motivation to continue treatment in a less acute level of care Reduction of life-threatening or endangering symptoms to within safe limits Verbal commitment to aftercare and medication compliance  PRELIMINARY DISCHARGE PLAN: Attend aftercare/continuing care group Attend 12-step recovery group Return to previous living arrangement  PATIENT/FAMIILY INVOLVEMENT: This treatment plan has been presented to and reviewed with the patient, Maureen Swanson, and/or family member, .  The patient and family have been given the opportunity to ask questions and make suggestions.  Andrena Mews 01/28/2013, 12:55 AM

## 2013-01-28 NOTE — Progress Notes (Signed)
D: Patient denies SI/HI and visual hallucinations. The patient is positive for command auditory hallucinations and reports hearing a voice that is telling her to kill herself. The patient states that this voice is "less than yesterday." The patient is isolating to her room and is having minimal participation within the milieu. The patient reports no active detox symptoms except for mild anxiety.  A: Patient given emotional support from RN. Patient encouraged to come to staff with concerns and/or questions. Patient's medication routine continued. Patient's orders and plan of care reviewed.  R: Patient remains cooperative. Will continue to monitor patient q15 minutes for safety.

## 2013-01-28 NOTE — Progress Notes (Signed)
Adult Psychoeducational Group Note  Date:  01/28/2013 Time:  1:11 PM  Group Topic/Focus:  Relapse Prevention Planning:   The focus of this group is to define relapse and discuss the need for planning to combat relapse.  Participation Level:  Minimal  Participation Quality:  Attentive  Affect:  Blunted and Flat  Cognitive:  Appropriate and Oriented  Insight: Improving  Engagement in Group:  Lacking  Modes of Intervention:  Discussion, Education and Support  Additional Comments:  Pt was an active listener in group but did not join in the conversation until the end. She asked what to do if she wanted to remain clean but her boyfriend was drinking?  Reynolds Bowl 01/28/2013, 1:11 PM

## 2013-01-28 NOTE — [facility] Suicide Risk Assessment (Signed)
Suicide Risk Assessment  Admission Assessment     Nursing information obtained from:    Demographic factors:    Current Mental Status:    Loss Factors:    Historical Factors:    Risk Reduction Factors:     CLINICAL FACTORS:   Depression:   Anhedonia Comorbid alcohol abuse/dependence Impulsivity Insomnia Alcohol/Substance Abuse/Dependencies Currently Psychotic Previous Psychiatric Diagnoses and Treatments  COGNITIVE FEATURES THAT CONTRIBUTE TO RISK:  Closed-mindedness Polarized thinking    SUICIDE RISK:   Mild:  Suicidal ideation of limited frequency, intensity, duration, and specificity.  There are no identifiable plans, no associated intent, mild dysphoria and related symptoms, good self-control (both objective and subjective assessment), few other risk factors, and identifiable protective factors, including available and accessible social support.  PLAN OF CARE:1. Admit for crisis management and stabilization. 2. Medication management to reduce current symptoms to base line and improve the     patient's overall level of functioning 3. Treat health problems as indicated. 4. Develop treatment plan to decrease risk of relapse upon discharge and the need for     readmission. 5. Psycho-social education regarding relapse prevention and self care. 6. Health care follow up as needed for medical problems. 7. Restart home medications where appropriate.   I certify that inpatient services furnished can reasonably be expected to improve the patient's condition.  Austine Kelsay,MD 01/28/2013, 11:20 AM

## 2013-01-28 NOTE — [facility] Group Notes (Signed)
[facility] LCSW Aftercare Discharge Planning Group Note   01/28/2013 9:07 AM  Participation Quality:  Minimal  Mood/Affect:  Blunted  Depression Rating:  9  Anxiety Rating:  9  Thoughts of Suicide:  No Will you contract for safety?   NA  Current AVH:  No pt stated that she no longer is hearing voices.   Plan for Discharge/Comments:  Pt lives with her bf in [place]. She was at [facility] but was brought here after reporting that she had been hearing voices. Pt plans to go back to [facility] after d/c from [facility]. She follows up with [facility] for med management and therapy [name]).   Transportation Means: unknown at this time  Supports: boyfriend.   Smart, [employer]

## 2013-01-28 NOTE — Progress Notes (Signed)
Psychoeducational Group Note  Date:  01/28/2013 Time:  1000  Group Topic/Focus:  Psychoeducational Group  Participation Level: Did Not Attend  Participation Quality:  Not Applicable  Affect:  Not Applicable  Cognitive:  Not Applicable  Insight:  Not Applicable  Engagement in Group: Not Applicable  Additional Comments:  Patient did not attend.  Wolfgang Finigan MCCOLLUM 01/28/2013, 11:02 AM

## 2013-01-28 NOTE — Clinical Social Work Note (Signed)
Pt stated that she spoke with someone from [facility] this morning. They told her to call when discharged from [facility] to schedule time to be readmitted into facility. CSW called and left VM for Tiana at [facility] to verify this.

## 2013-01-28 NOTE — BHH Counselor (Signed)
Adult Psychosocial Assessment Update Interdisciplinary Team  Previous [facility] admissions/discharges:  Admissions Discharges  Date: 01/27/13 Date: unknown at this time.   Date: 09/07/12 Date: 09/13/12  Date: 02/18/12 Date: 02/27/12  Date: Date:  Date: Date:   Changes since the last Psychosocial Assessment (including adherence to outpatient mental health and/or substance abuse treatment, situational issues contributing to decompensation and/or relapse). Pt confirms that she has been to [facility] approximately 3 times, her last admission was 09/07/12. Pt reports that she has been at [facility] "since Monday". Pt confirms that she has been drinking again for 2 weeks, drinking 3-4 40 oz beer daily and smoking crack cocaine ongoing for many years and her last use was "last Friday". Pt reported a hx of blackouts, last one occurring 6 months ago but denies hx of seizures. Pt reports nausea and anxiety as current withdrawal sx. Pt stated that she has Bipolar Disorder and is treated at [facility] of the [place]. Pt was seen at [facility] in 02/2012 and diagnosed with Schizoaffective Disorder, ETOH Abuse and Cocaine Abuse. Pt stated she takes her medications as prescribed, however, pt has a hx of medication noncompliance.              Discharge Plan 1. Will you be returning to the same living situation after discharge?   Yes: No:      If no, what is your plan?    Pt plans to return to [facility] to finish treatment. She then plans to return home where she lives with her boyfriend.        2. Would you like a referral for services when you are discharged? Yes:     If yes, for what services?  No:       Pt would like CSW to call [facility] to get new admission date and would like to continue to followup with [facility] for medication management and therapy [name]).        Summary and Recommendations (to be completed by the evaluator) Maureen Swanson is an 47 y.o. female presented to [facility]  accompanied by staff of [facility]. Pt reports hearing voices that are telling her to "take a knife and cut myself and hurt other people, cut them too". Pt is oriented x's 3 and does not think she needs to be in the hospital "I think I can go back to [facility], but if you think I need to go in the hospital, it ok". Recommendations for pt include: therapeutic milieu, crisis stabilization, encourage group attendance and particiaption, medication management for mood stabilization and elimination of psychosis, and development of comprehensive mental wellness/sobriety plan.                         Signature:  Smart, [place], 01/28/2013 9:20 AM

## 2013-01-28 NOTE — Tx Team (Signed)
Interdisciplinary Treatment Plan Update (Adult)  Date: 01/28/2013  Time Reviewed: 9:57 AM  Progress in Treatment:  Attending groups: Yes Participating in groups:  Yes Taking medication as prescribed: Yes  Tolerating medication: Yes  Family/Significant othe contact made: Not yet. SPE required for this pt.  Patient understands diagnosis: Yes, AEB seeking treament for SI, AH, substance abuse, and crisis stabilization.  Discussing patient identified problems/goals with staff: Yes  Medical problems stabilized or resolved: Yes  Denies suicidal/homicidal ideation: Yes in group. Pt also no longer reporting AH. Patient has not harmed self or Others: Yes  New problem(s) identified: n/a  Discharge Plan or Barriers: Pt hoping to return to [facility] after d/c from [facility] and plans to continue to follow up at [facility] for med management and therapy. Additional comments: Maureen Swanson is an 47 y.o. female presented to [facility] accompanied by staff of [facility]. Pt reports hearing voices that are telling her to "take a knife and cut myself and hurt other people, cut them too". Pt is oriented x's 3 and does not think she needs to be in the hospital "I think I can go back to [facility], but if you think I need to go in the hospital, it ok". Pt confirms that she has been to [facility] approximately 3 times, her last admission was 09/07/12. Pt reports that she has been at [facility] "since Monday". Pt confirms that she has been drinking again for 2 weeks, drinking 3-4 40 oz beer daily and smoking crack cocaine ongoing for many years and her last use was "last Friday". Pt reported a hx of blackouts, last one occurring 6 months ago but denies hx of seizures. Pt reports nausea and anxiety as current withdrawal sx. Pt stated that she has Bipolar Disorder and is treated at [facility] of the [place]. Pt was seen at [facility] in 02/2012 and diagnosed with Schizoaffective Disorder, ETOH Abuse and Cocaine Abuse. Pt stated she takes her medications  as prescribed, however, pt has a hx of medication noncompliance. Pt stated that her "voices have gotten better". but the voices still tell her she is no good and worthless. Pt stated she has been having racing thoughts. Pt appeared delayed in her responses.  Reason for Continuation of Hospitalization: Mood stabilization Medication management Estimated length of stay: 3-5 days For review of initial/current patient goals, please see plan of care.  Attendees:  Patient:    Family:    Physician:    Nursing: Sara Chu 01/28/2013 9:57 AM   Clinical Social Worker Nalini Alcaraz Smart, LCSWA  01/28/2013 9:57 AM   Other: Philippa Chester RN 01/28/2013 9:57 AM   Other: Aggie PA 01/28/2013 9:57 AM   Other:    Other:    Scribe for Treatment Team:  Trula Slade LCSWA 01/28/2013 9:57 AM

## 2013-01-29 ENCOUNTER — Encounter (HOSPITAL_COMMUNITY): Payer: Self-pay | Admitting: Registered Nurse

## 2013-01-29 MED ORDER — TRAZODONE HCL 100 MG PO TABS
300.0000 mg | ORAL_TABLET | Freq: Every day | ORAL | Status: DC
  Administered 2013-01-29 – 2013-01-30 (×2): 300 mg via ORAL
  Filled 2013-01-29 (×4): qty 3

## 2013-01-29 MED ORDER — CIPROFLOXACIN HCL 500 MG PO TABS
500.0000 mg | ORAL_TABLET | Freq: Two times a day (BID) | ORAL | Status: DC
  Administered 2013-01-29 – 2013-01-31 (×5): 500 mg via ORAL
  Filled 2013-01-29 (×8): qty 1

## 2013-01-29 MED ORDER — RISPERIDONE 2 MG PO TABS
2.0000 mg | ORAL_TABLET | Freq: Two times a day (BID) | ORAL | Status: DC
  Administered 2013-01-29 – 2013-01-31 (×4): 2 mg via ORAL
  Filled 2013-01-29 (×6): qty 1

## 2013-01-29 MED ORDER — METRONIDAZOLE 500 MG PO TABS
2000.0000 mg | ORAL_TABLET | Freq: Once | ORAL | Status: AC
  Administered 2013-01-29: 2000 mg via ORAL
  Filled 2013-01-29: qty 4

## 2013-01-29 NOTE — Progress Notes (Signed)
D. Pt has been up and has been visible in milieu this evening and has attended evening group. Pt did endorse feelings of depression and also endorsed auditory hallucinations this evening. Pt asked about her medications but could not recall what she was on before being admitted in regards to the auditory hallucinations as she feels the medications may not be working as they should. A. Support and encouragement provided, medication education completed. R. Pt verbalized understanding, will continue to monitor.

## 2013-01-29 NOTE — Progress Notes (Signed)
Adult Psychoeducational Group Note  Date:  01/29/2013 Time:  6:29 PM  Group Topic/Focus:  Healthy Communication:   The focus of this group is to discuss communication, barriers to communication, as well as healthy ways to communicate with others.  Participation Level:  Active  Participation Quality:  Appropriate and Attentive  Affect:  Appropriate  Cognitive:  Appropriate  Insight: Appropriate  Engagement in Group:  Engaged  Modes of Intervention:  Discussion, Education and Socialization   Elijio Miles 01/29/2013, 6:29 PM

## 2013-01-29 NOTE — BHH Group Notes (Signed)
BHH Group Notes:  (Clinical Social Work)  01/29/2013     10-11AM  Summary of Progress/Problems:   The main focus of today's process group was for the patient to identify ways in which they have in the past sabotaged their own recovery. Motivational Interviewing was utilized to ask the group members what they get out of their substance use, and what reasons they may have for wanting to change.  The Stages of Change were explained using a handout, and patients identified where they currently are with regard to stages of change.  The patient expressed that she self-sabotages by thinking she will just go ahead and drink "one last time" before going to detox.  She said the "high" is not as good as she thought.  She wants to change to enjoy life more, and states she is in Action Stage.  CSW challenged her that she needs to be in Preparation stage first, and she discussed ways to prevent those same people coming into her house.  Type of Therapy:  Group Therapy - Process   Participation Level:  Active  Participation Quality:  Appropriate and Attentive  Affect:  Blunted and Depressed  Cognitive:  Appropriate and Oriented  Insight:  Developing/Improving  Engagement in Therapy:  Engaged  Modes of Intervention:  Education, Support and Processing, Motivational Interviewing  Ambrose Mantle, LCSW 01/29/2013, 1:03 PM

## 2013-01-29 NOTE — Progress Notes (Signed)
Patient did not attend the evening speaker AA meeting. Pt remained in bed reporting not feeling well. Pt gave a urine sample and returned to bed.

## 2013-01-29 NOTE — Progress Notes (Signed)
Psychoeducational Group Note  Date:  01/29/2013 Time:  0945 am  Group Topic/Focus:  Identifying Needs:   The focus of this group is to help patients identify their personal needs that have been historically problematic and identify healthy behaviors to address their needs.  Participation Level:  Did Not Attend  Maureen Swanson 01/29/2013,9:21 AM

## 2013-01-29 NOTE — Progress Notes (Signed)
[facility] MD Progress Note  01/29/2013 2:15 PM Maureen Swanson  MRN:  [id number] Subjective:  Depressed and anxious, suicidal and homicidal ideations on admission.  Her goal is to not focus on her auditory hallucinations, medications also adjusted.  Sleep fair, appetite fair.  Patient denied drug and alcohol use on assessment today despite her recent alcohol and drug use.  UTI medications started and medication for trichomonas.  Diagnosis:   Axis I: Alcohol Abuse, Anxiety Disorder NOS, Bipolar, Depressed, Psychotic Disorder NOS and Substance Abuse Axis II: Deferred Axis III:  Past Medical History  Diagnosis Date  . High cholesterol   . Depression   . Gout   . Anxiety   . Bipolar 1 disorder   . GERD (gastroesophageal reflux disease)   . Substance abuse     crack cocaine  . Headache(784.0)     migraines  . TMJ (temporomandibular joint disorder)   . Asthma     daily and prn inhalers  . Arthritis     back and knees  . Gastric ulcer   . 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 Hypertension     under control, has been on med. x 12 yrs.  . Diabetes mellitus     diet-controlled   Axis IV: other psychosocial or environmental problems, problems related to social environment and problems with primary support group Axis V: 41-50 serious symptoms  ADL's:  Intact  Sleep: Fair  Appetite:  Fair  Suicidal Ideation:  Denies Homicidal Ideation:  Denies  Psychiatric Specialty Exam: Review of Systems  Constitutional: Negative.   HENT: Negative.   Eyes: Negative.   Respiratory: Negative.   Cardiovascular: Negative.   Gastrointestinal: Negative.   Genitourinary: Negative.   Musculoskeletal: Negative.   Skin: Negative.   Neurological: Negative.   Endo/Heme/Allergies: Negative.   Psychiatric/Behavioral: Positive for depression, suicidal ideas, hallucinations and substance abuse. The patient is nervous/anxious.     Blood pressure 107/74, pulse 88, temperature 98.5 F  (36.9 C), temperature source Oral, resp. rate 20, height 5' 4.25" (1.632 m), weight 82.101 kg (181 lb).Body mass index is 30.83 kg/(m^2).  General Appearance: Casual  Eye Contact::  Fair  Speech:  Normal Rate  Volume:  Normal  Mood:  Anxious and Depressed  Affect:  Congruent  Thought Process:  Coherent  Orientation:  Full (Time, Place, and Person)  Thought Content:  Auditory hallucinations  Suicidal Thoughts:  No  Homicidal Thoughts:  No  Memory:  Immediate;   Fair Recent;   Fair Remote;   Fair  Judgement:  Poor  Insight:  Lacking  Psychomotor Activity:  Decreased  Concentration:  Fair  Recall:  Fair  Akathisia:  No  Handed:  Right  AIMS (if indicated):     Assets:  Resilience  Sleep:  Number of Hours: 6.75   Current Medications: Current Facility-Administered Medications  Medication Dose Route Frequency Provider Last Rate Last Dose  . acetaminophen (TYLENOL) tablet 650 mg  650 mg Oral Q6H PRN Larena Sox, MD      . albuterol (PROVENTIL HFA;VENTOLIN HFA) 108 (90 BASE) MCG/ACT inhaler 2 puff  2 puff Inhalation Q6H PRN Larena Sox, MD      . alum & mag hydroxide-simeth (MAALOX/MYLANTA) 200-200-20 MG/5ML suspension 30 mL  30 mL Oral Q4H PRN Larena Sox, MD      . atenolol (TENORMIN) tablet 100 mg  100 mg Oral Daily Larena Sox, MD   100 mg at 01/29/13 0802  .  buPROPion [facility] SR) 12 hr tablet 150 mg  150 mg Oral BID Larena Sox, MD   150 mg at 01/29/13 0802  . ciprofloxacin (CIPRO) tablet 500 mg  500 mg Oral BID Nanine Means, NP      . hydrALAZINE (APRESOLINE) tablet 25 mg  25 mg Oral Q8H Larena Sox, MD   25 mg at 01/29/13 2440  . hydrochlorothiazide (HYDRODIURIL) tablet 25 mg  25 mg Oral Daily Larena Sox, MD   25 mg at 01/29/13 0802  . ibuprofen (ADVIL,MOTRIN) tablet 600 mg  600 mg Oral Q8H PRN Larena Sox, MD      . lamoTRIgine (LAMICTAL) tablet 150 mg  150 mg Oral Daily Larena Sox, MD   150 mg at 01/29/13 0802  . loratadine  (CLARITIN) tablet 10 mg  10 mg Oral Daily Larena Sox, MD   10 mg at 01/29/13 0802  . magnesium hydroxide (MILK OF MAGNESIA) suspension 30 mL  30 mL Oral Daily PRN Larena Sox, MD      . metroNIDAZOLE (FLAGYL) tablet 2,000 mg  2,000 mg Oral Once Nanine Means, NP      . multivitamin with minerals tablet 1 tablet  1 tablet Oral Daily Larena Sox, MD   1 tablet at 01/29/13 0802  . Oxcarbazepine (TRILEPTAL) tablet 300 mg  300 mg Oral BID Larena Sox, MD   300 mg at 01/29/13 0802  . pantoprazole (PROTONIX) EC tablet 40 mg  40 mg Oral QHS Larena Sox, MD   40 mg at 01/28/13 2121  . risperiDONE (RISPERDAL) tablet 2 mg  2 mg Oral BID Nanine Means, NP      . sulfamethoxazole-trimethoprim (BACTRIM DS) 800-160 MG per tablet 1 tablet  1 tablet Oral Q12H Sanjuana Kava, NP   1 tablet at 01/29/13 0802  . traZODone (DESYREL) tablet 300 mg  300 mg Oral QHS Larena Sox, MD   300 mg at 01/28/13 2121    Lab Results: No results found for this or any previous visit (from the past 48 hour(s)).  Physical Findings: AIMS: Facial and Oral Movements Muscles of Facial Expression: None, normal Lips and Perioral Area: None, normal Jaw: None, normal Tongue: None, normal,Extremity Movements Upper (arms, wrists, hands, fingers): None, normal Lower (legs, knees, ankles, toes): None, normal, Trunk Movements Neck, shoulders, hips: None, normal, Overall Severity Severity of abnormal movements (highest score from questions above): None, normal Incapacitation due to abnormal movements: None, normal Patient's awareness of abnormal movements (rate only patient's report): No Awareness, Dental Status Current problems with teeth and/or dentures?: No Does patient usually wear dentures?: No  CIWA:  CIWA-Ar Total: 2 COWS:     Treatment Plan Summary: Daily contact with patient to assess and evaluate symptoms and progress in treatment Medication management  Plan:  Review of chart, vital signs,  medications, and notes. 1-Individual and group therapy 2-Medication management for psychosis, depression and anxiety:  Medications reviewed with the patient and she stated the voices were still an issue--Risperdal changed to 2 mg BID and antibiotic started for her UTI and trichomonas 3-Coping skills for depression, anxiety, and substance abuse 4-Continue crisis stabilization and management 5-Address health issues--monitoring vital signs, stable 6-Treatment plan in progress to prevent relapse of depression, substance abuse, and anxiety  Medical Decision Making Problem Points:  Established problem, stable/improving (1) and Review of psycho-social stressors (1) Data Points:  Review of new medications or change in dosage (2)  I certify that inpatient services  furnished can reasonably be expected to improve the patient's condition.   Nanine Means, PMH-NP 01/29/2013, 2:15 PM

## 2013-01-29 NOTE — H&P (Signed)
  Pt was seen by me today and I agree with the key elements documented in H&P.  

## 2013-01-29 NOTE — Progress Notes (Signed)
D   Pt appears to be feeling better today   She denies any symptoms of withdrawal   She continues to endorse voices at times but denies suicidal ideation and contracts for safety    She is guarded and suspicious   She did not attend group this morning  Pt rates her depression and hoplessness at a 7 and reports her appetite is improving A   Verbal support given   Medications administered and effectiveness monitored   Q 15 min checks R   Pt safe at present

## 2013-01-29 NOTE — BHH Suicide Risk Assessment (Signed)
Suicide Risk Assessment  Admission Assessment     Nursing information obtained from:    Demographic factors:   females Current Mental Status:   densies si now, logical, sad mood Loss Factors:   unable to function well Historical Factors:   2 past SI attempts Risk Reduction Factors:   lives with family  CLINICAL FACTORS:   Alcohol/Substance Abuse/Dependencies  COGNITIVE FEATURES THAT CONTRIBUTE TO RISK:  Closed-mindedness    SUICIDE RISK:   Moderate:  Frequent suicidal ideation with limited intensity, and duration, some specificity in terms of plans, no associated intent, good self-control, limited dysphoria/symptomatology, some risk factors present, and identifiable protective factors, including available and accessible social support.  PLAN OF CARE:  Continue current meds  I certify that inpatient services furnished can reasonably be expected to improve the patient's condition.  Wonda Cerise 01/29/2013, 6:59 PM

## 2013-01-30 DIAGNOSIS — F411 Generalized anxiety disorder: Secondary | ICD-10-CM

## 2013-01-30 DIAGNOSIS — F259 Schizoaffective disorder, unspecified: Secondary | ICD-10-CM

## 2013-01-30 DIAGNOSIS — F1994 Other psychoactive substance use, unspecified with psychoactive substance-induced mood disorder: Secondary | ICD-10-CM

## 2013-01-30 DIAGNOSIS — F1910 Other psychoactive substance abuse, uncomplicated: Secondary | ICD-10-CM

## 2013-01-30 LAB — URINALYSIS, ROUTINE W REFLEX MICROSCOPIC
Bilirubin Urine: NEGATIVE
Glucose, UA: NEGATIVE mg/dL
Hgb urine dipstick: NEGATIVE
Ketones, ur: NEGATIVE mg/dL
Nitrite: NEGATIVE
Specific Gravity, Urine: 1.013 (ref 1.005–1.030)
pH: 5.5 (ref 5.0–8.0)

## 2013-01-30 MED ORDER — BUPROPION HCL ER (SR) 100 MG PO TB12
200.0000 mg | ORAL_TABLET | Freq: Two times a day (BID) | ORAL | Status: DC
  Administered 2013-01-30 – 2013-01-31 (×2): 200 mg via ORAL
  Filled 2013-01-30 (×4): qty 2

## 2013-01-30 MED ORDER — HYDROXYZINE HCL 25 MG PO TABS
25.0000 mg | ORAL_TABLET | Freq: Four times a day (QID) | ORAL | Status: DC | PRN
  Filled 2013-01-30: qty 30

## 2013-01-30 NOTE — Progress Notes (Signed)
Patient ID: Maureen Swanson, female   DOB: [birth year], 47 y.o.   MRN: [id number] D. The patient spent the evening resting in bed with her eyes closed. Stated that she attended all her groups throughout the day and she was tired and wanted to go to bed early. A. Met with patient to assess. Encouraged to attend evening group. Administered HS medication. R. Did not attend evening group. She did get out of bed to receive her medication. Denied any symptoms of withdrawal or suicidal ideation. Compliant with medication.

## 2013-01-30 NOTE — Progress Notes (Signed)
Adult Psychoeducational Group Note  Date:  01/30/2013 Time:  5:06 PM  Group Topic/Focus:  Making Healthy Choices:   The focus of this group is to help patients identify negative/unhealthy choices they were using prior to admission and identify positive/healthier coping strategies to replace them upon discharge.  Participation Level:  Active  Participation Quality:  Appropriate and Attentive  Affect:  Appropriate  Cognitive:  Alert and Appropriate  Insight: Appropriate  Engagement in Group:  Engaged  Modes of Intervention:  Activity and Socialization  Additional Comments:    Malachy Moan 01/30/2013, 5:06 PM

## 2013-01-30 NOTE — BHH Group Notes (Signed)
BHH Group Notes:  (Clinical Social Work)  01/30/2013  10:00-11:00AM  Summary of Progress/Problems:   The main focus of today's process group was to   identify the patient's current support system and decide on other supports that can be put in place.  The picture on workbook was used to discuss why additional supports are needed, and a hand-out was distributed with four definitions/levels of support, then used to talk about how patients have given and received all different kinds of support.  An emphasis was placed on using counselor, doctor, therapy groups, 12-step groups, and problem-specific support groups to expand supports.  The patient identified current support as being an uncle, appeared engage and interested in adding additional supports.  Type of Therapy:  Process Group with Motivational Interviewing  Participation Level:  Active  Participation Quality:  Attentive  Affect:  Blunted  Cognitive:  Appropriate  Insight:  Developing/Improving  Engagement in Therapy:  Engaged  Modes of Intervention:   Education, Support and Processing, Activity  [employer], LCSW 01/30/2013, 12:18 PM

## 2013-01-30 NOTE — Progress Notes (Signed)
Patient did not attend the evening speaker AA meeting. Pt remained in bed during meeting.   

## 2013-01-30 NOTE — Progress Notes (Signed)
Psychoeducational Group Note  Date:  01/30/2013 Time:  0945 am  Group Topic/Focus:  Making Healthy Choices:   The focus of this group is to help patients identify negative/unhealthy choices they were using prior to admission and identify positive/healthier coping strategies to replace them upon discharge.  Participation Level:  Active  Participation Quality:  Appropriate  Affect:  Appropriate  Cognitive:  Appropriate  Insight:  Developing/Improving  Engagement in Group:  Developing/Improving  Additional Comments:    Andrena Mews 01/30/2013, 10:29 AM

## 2013-01-30 NOTE — Progress Notes (Signed)
[facility] MD Progress Note  01/30/2013 11:45 AM Maureen Swanson  MRN:  [id number] Subjective:  Sleep and appetite are fair, depression is high and does not feel her current medications are helping--increased Wellbutrin.  Denies suicidial/homicidal ideations and hallucinations, anxiety is high--Vistaril ordered PRN.  Denies withdrawal symptoms.  Depressed mood and flat affect, slow to respond--some psychomotor retardation noted.  Diagnosis:   Axis I: Anxiety Disorder NOS, Schizoaffective Disorder, Substance Abuse and Substance Induced Mood Disorder Axis II: Deferred Axis III:  Past Medical History  Diagnosis Date  . High cholesterol   . Depression   . Gout   . Anxiety   . Bipolar 1 disorder   . GERD (gastroesophageal reflux disease)   . Substance abuse     crack cocaine  . Headache(784.0)     migraines  . TMJ (temporomandibular joint disorder)   . Asthma     daily and prn inhalers  . Arthritis     back and knees  . Gastric ulcer   . 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 Hypertension     under control, has been on med. x 12 yrs.  . Diabetes mellitus     diet-controlled   Axis IV: economic problems, other psychosocial or environmental problems, problems related to social environment and problems with primary support group Axis V: 41-50 serious symptoms  ADL's:  Intact  Sleep: Fair  Appetite:  Fair  Suicidal Ideation:  Denies Homicidal Ideation:  Denies  Psychiatric Specialty Exam: Review of Systems  Constitutional: Negative.   HENT: Negative.   Eyes: Negative.   Respiratory: Negative.   Cardiovascular: Negative.   Gastrointestinal: Negative.   Genitourinary: Negative.   Musculoskeletal: Negative.   Skin: Negative.   Neurological: Negative.   Endo/Heme/Allergies: Negative.   Psychiatric/Behavioral: Positive for depression and substance abuse. The patient is nervous/anxious.     Blood pressure 112/79, pulse 74, temperature 98.9 F (37.2 C),  temperature source Oral, resp. rate 16, height 5' 4.25" (1.632 m), weight 82.101 kg (181 lb).Body mass index is 30.83 kg/(m^2).  General Appearance: Casual  Eye Contact::  Fair  Speech:  Slow  Volume:  Decreased  Mood:  Anxious and Depressed  Affect:  Flat  Thought Process:  Coherent  Orientation:  Full (Time, Place, and Person)  Thought Content:  WDL  Suicidal Thoughts:  No  Homicidal Thoughts:  No  Memory:  Immediate;   Fair Recent;   Fair Remote;   Fair  Judgement:  Poor  Insight:  Fair  Psychomotor Activity:  Decreased  Concentration:  Fair  Recall:  Fair  Akathisia:  No  Handed:  Right  AIMS (if indicated):     Assets:  Communication Skills Physical Health Resilience  Sleep:  Number of Hours: 6.5   Current Medications: Current Facility-Administered Medications  Medication Dose Route Frequency Provider Last Rate Last Dose  . acetaminophen (TYLENOL) tablet 650 mg  650 mg Oral Q6H PRN Larena Sox, MD      . albuterol (PROVENTIL HFA;VENTOLIN HFA) 108 (90 BASE) MCG/ACT inhaler 2 puff  2 puff Inhalation Q6H PRN Larena Sox, MD      . alum & mag hydroxide-simeth (MAALOX/MYLANTA) 200-200-20 MG/5ML suspension 30 mL  30 mL Oral Q4H PRN Larena Sox, MD      . atenolol (TENORMIN) tablet 100 mg  100 mg Oral Daily Larena Sox, MD   100 mg at 01/30/13 0809  . buPROPion (WELLBUTRIN SR) 12 hr tablet 200  mg  200 mg Oral BID Nanine Means, NP      . ciprofloxacin (CIPRO) tablet 500 mg  500 mg Oral BID Nanine Means, NP   500 mg at 01/30/13 0809  . hydrALAZINE (APRESOLINE) tablet 25 mg  25 mg Oral Q8H Larena Sox, MD   25 mg at 01/30/13 0618  . hydrochlorothiazide (HYDRODIURIL) tablet 25 mg  25 mg Oral Daily Larena Sox, MD   25 mg at 01/30/13 0809  . ibuprofen (ADVIL,MOTRIN) tablet 600 mg  600 mg Oral Q8H PRN Larena Sox, MD      . lamoTRIgine (LAMICTAL) tablet 150 mg  150 mg Oral Daily Larena Sox, MD   150 mg at 01/30/13 0809  . loratadine (CLARITIN)  tablet 10 mg  10 mg Oral Daily Larena Sox, MD   10 mg at 01/30/13 0809  . magnesium hydroxide (MILK OF MAGNESIA) suspension 30 mL  30 mL Oral Daily PRN Larena Sox, MD      . multivitamin with minerals tablet 1 tablet  1 tablet Oral Daily Larena Sox, MD   1 tablet at 01/30/13 0809  . Oxcarbazepine (TRILEPTAL) tablet 300 mg  300 mg Oral BID Larena Sox, MD   300 mg at 01/30/13 0809  . pantoprazole (PROTONIX) EC tablet 40 mg  40 mg Oral QHS Larena Sox, MD   40 mg at 01/29/13 2119  . risperiDONE (RISPERDAL) tablet 2 mg  2 mg Oral BID Nanine Means, NP   2 mg at 01/30/13 0809  . sulfamethoxazole-trimethoprim (BACTRIM DS) 800-160 MG per tablet 1 tablet  1 tablet Oral Q12H Sanjuana Kava, NP   1 tablet at 01/30/13 0809  . traZODone (DESYREL) tablet 300 mg  300 mg Oral QHS Rachael Fee, MD   300 mg at 01/29/13 2121    Lab Results: No results found for this or any previous visit (from the past 48 hour(s)).  Physical Findings: AIMS: Facial and Oral Movements Muscles of Facial Expression: None, normal Lips and Perioral Area: None, normal Jaw: None, normal Tongue: None, normal,Extremity Movements Upper (arms, wrists, hands, fingers): None, normal Lower (legs, knees, ankles, toes): None, normal, Trunk Movements Neck, shoulders, hips: None, normal, Overall Severity Severity of abnormal movements (highest score from questions above): None, normal Incapacitation due to abnormal movements: None, normal Patient's awareness of abnormal movements (rate only patient's report): No Awareness, Dental Status Current problems with teeth and/or dentures?: No Does patient usually wear dentures?: No  CIWA:  CIWA-Ar Total: 1 COWS:  COWS Total Score: 0  Treatment Plan Summary: Daily contact with patient to assess and evaluate symptoms and progress in treatment Medication management  Plan:  Review of chart, vital signs, medications, and notes. 1-Individual and group  therapy 2-Medication management for depression and anxiety:  Medications reviewed with the patient and Wellbutrin increased for depression and Vistaril added for anxiety 3-Coping skills for depression, anxiety, and substance abuse 4-Continue crisis stabilization and management 5-Address health issues--monitoring vital signs, stable 6-Treatment plan in progress to prevent relapse of depression, substance abuse, and anxiety  Medical Decision Making Problem Points:  Established problem, stable/improving (1) and Review of psycho-social stressors (1) Data Points:  Review of new medications or change in dosage (2)  I certify that inpatient services furnished can reasonably be expected to improve the patient's condition.   Nanine Means, PMH-NP 01/30/2013, 11:45 AM

## 2013-01-30 NOTE — [facility] Group Notes (Signed)
[facility] Group Notes:  (Nursing/MHT/Case Management/Adjunct)  Date:  01/30/2013  Time:  2:51 PM  Type of Therapy:  Nurse Education  Participation Level:  Active  Participation Quality:  Appropriate  Affect:  Appropriate  Cognitive:  Appropriate  Insight:  Appropriate  Engagement in Group:  Engaged  Modes of Intervention:  Problem-solving  Summary of Progress/Problems:  Maureen Swanson 01/30/2013, 2:51 PM

## 2013-01-31 DIAGNOSIS — F1410 Cocaine abuse, uncomplicated: Secondary | ICD-10-CM

## 2013-01-31 MED ORDER — OXCARBAZEPINE 300 MG PO TABS: 300.0000 mg | ORAL_TABLET | Freq: Two times a day (BID) | ORAL | Status: DC

## 2013-01-31 MED ORDER — TRAZODONE HCL 300 MG PO TABS: 300.0000 mg | ORAL_TABLET | Freq: Every day | ORAL | Status: DC

## 2013-01-31 MED ORDER — LORATADINE 10 MG PO TABS: 10.0000 mg | ORAL_TABLET | Freq: Every day | ORAL | Status: DC

## 2013-01-31 MED ORDER — BUPROPION HCL ER (SR) 200 MG PO TB12: 200.0000 mg | ORAL_TABLET | Freq: Two times a day (BID) | ORAL | Status: DC

## 2013-01-31 MED ORDER — HYDROCHLOROTHIAZIDE 25 MG PO TABS
25.0000 mg | ORAL_TABLET | Freq: Every day | ORAL | Status: DC
  Filled 2013-01-31: qty 14

## 2013-01-31 MED ORDER — HYDRALAZINE HCL 25 MG PO TABS: 25.0000 mg | ORAL_TABLET | Freq: Three times a day (TID) | ORAL | Status: DC

## 2013-01-31 MED ORDER — ALBUTEROL SULFATE HFA 108 (90 BASE) MCG/ACT IN AERS: 2.0000 | INHALATION_SPRAY | Freq: Four times a day (QID) | RESPIRATORY_TRACT | Status: DC | PRN

## 2013-01-31 MED ORDER — SULFAMETHOXAZOLE-TMP DS 800-160 MG PO TABS: 1.0000 | ORAL_TABLET | Freq: Two times a day (BID) | ORAL | Status: DC

## 2013-01-31 MED ORDER — CIPROFLOXACIN HCL 500 MG PO TABS: 500.0000 mg | ORAL_TABLET | Freq: Two times a day (BID) | ORAL | Status: DC

## 2013-01-31 MED ORDER — ATENOLOL 100 MG PO TABS: 100.0000 mg | ORAL_TABLET | Freq: Every day | ORAL | Status: DC

## 2013-01-31 MED ORDER — HYDROCHLOROTHIAZIDE 25 MG PO TABS: 25.0000 mg | ORAL_TABLET | Freq: Every day | ORAL | Status: DC

## 2013-01-31 MED ORDER — IBUPROFEN 200 MG PO TABS: 600.0000 mg | ORAL_TABLET | Freq: Every day | ORAL | Status: DC | PRN

## 2013-01-31 MED ORDER — LAMOTRIGINE 150 MG PO TABS: 150.0000 mg | ORAL_TABLET | Freq: Every day | ORAL | Status: DC

## 2013-01-31 MED ORDER — ADULT MULTIVITAMIN W/MINERALS CH: 1.0000 | ORAL_TABLET | Freq: Every day | ORAL | Status: DC

## 2013-01-31 MED ORDER — PANTOPRAZOLE SODIUM 40 MG PO TBEC: 40.0000 mg | DELAYED_RELEASE_TABLET | Freq: Every day | ORAL | Status: DC

## 2013-01-31 MED ORDER — HYDROXYZINE HCL 25 MG PO TABS: 25.0000 mg | ORAL_TABLET | Freq: Four times a day (QID) | ORAL | Status: DC | PRN

## 2013-01-31 MED ORDER — RISPERIDONE 2 MG PO TABS: 2.0000 mg | ORAL_TABLET | Freq: Two times a day (BID) | ORAL | Status: DC

## 2013-01-31 NOTE — [facility] Group Notes (Signed)
[facility] LCSW Aftercare Discharge Planning Group Note   01/31/2013 9:27 AM  Participation Quality:  Appropriate  Mood/Affect:  Appropriate  Depression Rating:  7  Anxiety Rating:  7  Thoughts of Suicide:  No Will you contract for safety?   NA  Current AVH:  No  Plan for Discharge/Comments:  Pt stated that he is feeling "alittle better" this morning. CSW spoke with [place] from [facility]. Pt can return to [facility] after leaving [facility].   Transportation Means: bus/family member/boyfriend  Supports: boyfriend  Counselling psychologist, Research scientist (physical sciences)

## 2013-01-31 NOTE — Progress Notes (Signed)
[facility] Adult Case Management Discharge Plan :  Will you be returning to the same living situation after discharge: No. [facility]  At discharge, do you have transportation home?:Yes,  [facility] to pick up patient Do you have the ability to pay for your medications:Yes,  mental health  Release of information consent forms completed and in the chart;  Patient's signature needed at discharge.  Patient to Follow up at: Follow-up Information   Follow up with [facility]. Bennie Hind from [facility] verified that you are able to return to facility immediately after discharge from the hospital. )    Contact information:   [address] [place], [place] [address] phone: [phone] fax: [phone]      Follow up with [facility]. (Call to schedule follow-up appointment for therapy and medication management for after discharge from [facility]. )    Contact information:   [address] [place] [address] phone: [phone] fax: [phone]      Patient denies SI/HI:   Yes,  in group/self report.    Safety Planning and Suicide Prevention discussed:  Yes,  SPE completed with pt as she did not consent to family contact. Pt provided with SPI pamphlet and encouraged to ask questions/talk about any concerns relating to SPE.  Smart, Ojani Berenson 01/31/2013, 10:39 AM

## 2013-01-31 NOTE — Progress Notes (Signed)
Adult Psychoeducational Group Note  Date:  01/31/2013 Time:  11:00AM Group Topic/Focus:  Wellness Toolbox:   The focus of this group is to discuss various aspects of wellness, balancing those aspects and exploring ways to increase the ability to experience wellness.  Patients will create a wellness toolbox for use upon discharge.  Participation Level:  Active  Participation Quality:  Appropriate and Attentive  Affect:  Appropriate  Cognitive:  Alert and Appropriate  Insight: Appropriate  Engagement in Group:  Engaged  Modes of Intervention:  Discussion  Additional Comments:  Pt. Was attentive and appropriate during today's group discussion. Pt was able to complete Self Care Assessment in today's workbook. Pt shared how she need to take better physical self care. Pt. Stated that she need to get regular medical care for prevention and get enough sleep. Pt shared that she is willing to make the current health changes.   Bing Plume D 01/31/2013, 12:02 PM

## 2013-01-31 NOTE — Progress Notes (Signed)
Patient ID: Maureen Swanson, female   DOB: 05/22/1966, 47 y.o.   MRN: [id number] Patient was discharged ambulatory to return to [facility] and she was transported by Their staff.  She denies SI/HI.  She denies hearing voices.  She was given scripts and a 2 week supply of meds.  She verbalizes understanding of how to take he meds and her followup.  She is hopeful about completing their program.

## 2013-01-31 NOTE — Discharge Summary (Signed)
Physician Discharge Summary Note  Patient:  Maureen Swanson is an 47 y.o., female MRN:  [id number] DOB:  05/15/1966 Patient phone:  [phone] (home)  Patient address:   [address] [place] [address],   Date of Admission:  01/27/2013 Date of Discharge: 01/31/13  Reason for Admission:   Discharge Diagnoses: Principal Problem:   Schizoaffective disorder, depressive type Active Problems:   Alcohol dependence   Knee pain   Cocaine abuse, episodic   Cocaine abuse, continuous use  Review of Systems  Constitutional: Negative.   HENT: Negative.   Eyes: Negative.   Respiratory: Negative.   Cardiovascular: Negative.   Gastrointestinal: Negative.   Genitourinary: Negative.   Musculoskeletal: Negative.   Skin: Negative.   Neurological: Negative.   Endo/Heme/Allergies: Negative.   Psychiatric/Behavioral: Positive for depression (Stabilized with medication prior to discharge) and substance abuse (Alcoholism, Cocaine dependence). Negative for suicidal ideas, hallucinations and memory loss. The patient is nervous/anxious (Stabilized with medication prior to discharge) and has insomnia (Stabilized with medication prior to discharhe).    Axis Diagnosis:   AXIS I:  Schizoaffective Disorder and Alcohol dependence, Cocaine abuse AXIS II:  Deferred AXIS III:   Past Medical History  Diagnosis Date  . High cholesterol   . Depression   . Gout   . Anxiety   . Bipolar 1 disorder   . GERD (gastroesophageal reflux disease)   . Substance abuse     crack cocaine  . Headache(784.0)     migraines  . TMJ (temporomandibular joint disorder)   . Asthma     daily and prn inhalers  . Arthritis     back and knees  . Gastric ulcer   . 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 Hypertension     under control, has been on med. x 12 yrs.  . Diabetes mellitus     diet-controlled   AXIS IV:  Polysubstance abuse/dependence AXIS V:  63  Level of Care:  OP  Hospital  Course:  This is a 47 year old African-American female. Admitted to [facility] from the [facility] hospital ED with complaints of auditory hallucinations telling her to kill other people. Patient reports, "The staff from the [facility] treatment center took me to the [facility] yesterday. I was hearing voices. The voices were telling me to kill myself and hurt other people with a knife. So, I told the staff at the treatment center. The voices have been going on for a long time. But it got really bad last Tuesday night. I did not attempt to do what the voices were telling me to do. I was at the [facility] treatment Center for alcohol/crack abuse treatment. I have been using crack and drinking a lot of alcohol x 10 years or more. My longest sobriety is 2 years, and that was a long time ago. I started treatment at [facility] last Monday"  While a patient in this hospital and after admission assessment/evaluation, it was determined based on patient's symptoms that she will need medication management to stabilize her current psychotic mood symptoms so that she can go back to [facility] to complete her substance abuse treatment. She has a history of chronic alcoholism and cocaine dependence. She was then ordered and received Risperdal 2 mg two times daily for mood control, Wellbutrin 200 mg twice daily for depression,Trazodone 300 mg Q bedtime for sleep, Lamictal 150 mg daily for mood stabilization, Trileptal 300 mg twice daily for mood  stabilization and Hydroxyzine 25 mg Q 6 hours for anxiety . She also was enrolled in group counseling sessions and activities where she was counseled and learned coping skills that should help her cope better and manage her symptoms effectively after discharge. She also received medication management and monitoring for her other previously existing medical issues and concerns. She tolerated her treatment regimen without any significant adverse effects and or reactions presented.    Patient did respond positively to her treatment regimen. This is evidenced by her daily reports of improved mood, reduction of symptoms and presentation of good affect/eye contact. She attended treatment team meeting this am and met with her treatment team members. Her reason for admission, present symptoms, response to treatment and discharge plans discussed. Ms. Holben endorsed that her symptoms has stabilized and that she is ready for discharge to continue substance abuse treatment at [facility]. She will be picked up by the [facility] treatment center staff after discharge. And for routine psychiatric care, patient will follow-up care at the [facility] [place] here in [place], [place]. The addresses and contact information for this clinic and the [facility] treatment center provided for patient in writing.   Upon discharge, Ms. Krogh adamantly denies any suicidal, homicidal ideations, auditory, visual hallucinations, paranoia and or delusional thoughts. She was provided with 14 days worth supply samples of her [facility] discharge medications. She left [facility] with all personal belongings via personal arranged transport in no apparent distress.  Consults:  psychiatry  Significant Diagnostic Studies:  labs: CBC with diff, CMP, UDS  Discharge Vitals:   Blood pressure 91/60, pulse 69, temperature 98.3 F (36.8 C), temperature source Oral, resp. rate 20, height 5' 4.25" (1.632 m), weight 82.101 kg (181 lb). Body mass index is 30.83 kg/(m^2). Lab Results:   Results for orders placed during the hospital encounter of 01/27/13 (from the past 72 hour(s))  URINALYSIS, ROUTINE W REFLEX MICROSCOPIC     Status: Abnormal   Collection Time    01/29/13  8:33 PM      Result Value Range   Color, Urine YELLOW  YELLOW   APPearance CLOUDY (*) CLEAR   Specific Gravity, Urine 1.013  1.005 - 1.030   pH 5.5  5.0 - 8.0   Glucose, UA NEGATIVE  NEGATIVE mg/dL   Hgb urine dipstick NEGATIVE  NEGATIVE   Bilirubin Urine  NEGATIVE  NEGATIVE   Ketones, ur NEGATIVE  NEGATIVE mg/dL   Protein, ur NEGATIVE  NEGATIVE mg/dL   Urobilinogen, UA 0.2  0.0 - 1.0 mg/dL   Nitrite NEGATIVE  NEGATIVE   Leukocytes, UA LARGE (*) NEGATIVE   Comment: Performed at [facility]  URINE MICROSCOPIC-ADD ON     Status: None   Collection Time    01/29/13  8:33 PM      Result Value Range   WBC, UA 7-10  <3 WBC/hpf   Comment: Performed at [facility]    Physical Findings: AIMS: Facial and Oral Movements Muscles of Facial Expression: None, normal Lips and Perioral Area: None, normal Jaw: None, normal Tongue: None, normal,Extremity Movements Upper (arms, wrists, hands, fingers): None, normal Lower (legs, knees, ankles, toes): None, normal, Trunk Movements Neck, shoulders, hips: None, normal, Overall Severity Severity of abnormal movements (highest score from questions above): None, normal Incapacitation due to abnormal movements: None, normal Patient's awareness of abnormal movements (rate only patient's report): No Awareness, Dental Status Current problems with teeth and/or dentures?: Yes (teeth loose and gums are sore) Does patient  usually wear dentures?: No  CIWA:  CIWA-Ar Total: 1 COWS:  COWS Total Score: 0  Psychiatric Specialty Exam: See Psychiatric Specialty Exam and Suicide Risk Assessment completed by Attending Physician prior to discharge.  Discharge destination:  [facility]  Is patient on multiple antipsychotic therapies at discharge:  No   Has Patient had three or more failed trials of antipsychotic monotherapy by history:  No  Recommended Plan for Multiple Antipsychotic Therapies: NA     Medication List    STOP taking these medications       cetirizine 10 MG tablet  Commonly known as:  ZYRTEC     MULTIVITAMIN PO      TAKE these medications     Indication   albuterol 108 (90 BASE) MCG/ACT inhaler  Commonly known as:  PROVENTIL HFA;VENTOLIN HFA   Inhale 2 puffs into the lungs every 6 (six) hours as needed for wheezing or shortness of breath.   Indication:  Asthma, SOB     atenolol 100 MG tablet  Commonly known as:  TENORMIN  Take 1 tablet (100 mg total) by mouth daily. For hypertension   Indication:  High Blood Pressure     buPROPion 200 MG 12 hr tablet  Commonly known as:  WELLBUTRIN SR  Take 1 tablet (200 mg total) by mouth 2 (two) times daily. For depression   Indication:  Major Depressive Disorder     hydrALAZINE 25 MG tablet  Commonly known as:  APRESOLINE  Take 1 tablet (25 mg total) by mouth 3 (three) times daily. For hypertension   Indication:  High Blood Pressure     hydrochlorothiazide 25 MG tablet  Commonly known as:  HYDRODIURIL  Take 1 tablet (25 mg total) by mouth daily. For hypertension   Indication:  High Blood Pressure     hydrOXYzine 25 MG tablet  Commonly known as:  ATARAX/VISTARIL  Take 1 tablet (25 mg total) by mouth every 6 (six) hours as needed for anxiety.   Indication:  Anxiety associated with Organic Disease     ibuprofen 200 MG tablet  Commonly known as:  ADVIL,MOTRIN  Take 3 tablets (600 mg total) by mouth daily as needed for pain.   Indication:  Mild to Moderate Pain     lamoTRIgine 150 MG tablet  Commonly known as:  LAMICTAL  Take 1 tablet (150 mg total) by mouth daily. For mood stabilization   Indication:  Mood stabilization     loratadine 10 MG tablet  Commonly known as:  CLARITIN  Take 1 tablet (10 mg total) by mouth daily. (May be purchased from over the counter): For allergies   Indication:  Perennial Rhinitis, Hayfever     multivitamin with minerals Tabs tablet  Take 1 tablet by mouth daily. (Resume what you have at home): Vitamin supplement   Indication:  Low vitamin     Oxcarbazepine 300 MG tablet  Commonly known as:  TRILEPTAL  Take 1 tablet (300 mg total) by mouth 2 (two) times daily. For mood stabilization   Indication:  Mood stabilization     pantoprazole 40 MG  tablet  Commonly known as:  PROTONIX  Take 1 tablet (40 mg total) by mouth at bedtime. For acid reflux   Indication:  Gastroesophageal Reflux Disease     risperiDONE 2 MG tablet  Commonly known as:  RISPERDAL  Take 1 tablet (2 mg total) by mouth 2 (two) times daily. For mood control   Indication:  Manic-Depression, Mood control  sulfamethoxazole-trimethoprim 800-160 MG per tablet  Commonly known as:  BACTRIM DS  Take 1 tablet by mouth every 12 (twelve) hours. For infection   Indication:  Infection     trazodone 300 MG tablet  Commonly known as:  DESYREL  Take 1 tablet (300 mg total) by mouth at bedtime. For sleep   Indication:  Trouble Sleeping       Follow-up Information   Follow up with [facility]. Bennie Hind from [facility] verified that you are able to return to facility immediately after discharge from the hospital. )    Contact information:   [address] [place], [place] [address] phone: [phone] fax: [phone]      Follow up with [facility]. (Call to schedule follow-up appointment for therapy and medication management for after discharge from [facility]. )    Contact information:   [address] [place] [address] phone: [phone] fax: [phone]     Follow-up recommendations: Activity:  As tolerated Diet: As recommended by your primary care doctor. Keep all scheduled follow-up appointments as recommended.  Continue to work your relapse prevention plan Comments: Take all your medications as prescribed by your mental healthcare provider. Report any adverse effects and or reactions from your medicines to your outpatient provider promptly. Patient is instructed and cautioned to not engage in alcohol and or illegal drug use while on prescription medicines. In the event of worsening symptoms, patient is instructed to call the crisis hotline, 911 and or go to the nearest ED for appropriate evaluation and treatment  of symptoms. Follow-up with your primary care provider for your other medical issues, concerns and or health care needs.   Total Discharge Time:  Greater than 30 minutes.  Signed: Sanjuana Kava, PMHNP-BC, FNP 01/31/2013, 4:03 PM Agree with assessment and plan Madie Reno a. Dub Mikes, M.D.

## 2013-01-31 NOTE — [facility] Suicide Risk Assessment (Signed)
Suicide Risk Assessment  Discharge Assessment     Demographic Factors:  NA  Mental Status Per Nursing Assessment::   On Admission:     Current Mental Status by Physician: In full contact with reality. There are no suicidal ideas, plans or intent. Her mood is euthymic. Her affect is appropriate. She is willing and motivated to pursue furher rehab treatment. She will be going to [facility] today   Loss Factors: NA  Historical Factors: NA  Risk Reduction Factors:   Sense of responsibility to family and Positive social support  Continued Clinical Symptoms:  Alcohol/Substance Abuse/Dependencies  Cognitive Features That Contribute To Risk:  Polarized thinking Thought constriction (tunnel vision)    Suicide Risk:  Minimal: No identifiable suicidal ideation.  Patients presenting with no risk factors but with morbid ruminations; may be classified as minimal risk based on the severity of the depressive symptoms  Discharge Diagnoses:   AXIS I:  Cocaine Dependence, Alcohol Dependence, Mood Disorder NOS AXIS II:  Deferred AXIS III:   Past Medical History  Diagnosis Date  . High cholesterol   . Depression   . Gout   . Anxiety   . Bipolar 1 disorder   . GERD (gastroesophageal reflux disease)   . Substance abuse     crack cocaine  . Headache(784.0)     migraines  . TMJ (temporomandibular joint disorder)   . Asthma     daily and prn inhalers  . Arthritis     back and knees  . Gastric ulcer   . 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 Hypertension     under control, has been on med. x 12 yrs.  . Diabetes mellitus     diet-controlled   AXIS IV:  other psychosocial or environmental problems AXIS V:  61-70 mild symptoms  Plan Of Care/Follow-up recommendations:  Activity:  as tolerated Diet:  regular Follow up [facility] Is patient on multiple antipsychotic therapies at discharge:  No   Has Patient had three or more failed trials of antipsychotic monotherapy  by history:  No  Recommended Plan for Multiple Antipsychotic Therapies: N/A   Zyra Parrillo A 01/31/2013, 12:15 PM

## 2013-01-31 NOTE — [facility] Group Notes (Signed)
Adult Psychoeducational Group Note  Date:  01/31/2013 Time:  10:50 AM  Group Topic/Focus:  Wellness Toolbox:   The focus of this group is to discuss various aspects of wellness, balancing those aspects and exploring ways to increase the ability to experience wellness.  Patients will create a wellness toolbox for use upon discharge.  Participation Level:  Active  Participation Quality:  Appropriate  Affect:  Appropriate  Cognitive:  Appropriate  Insight: Appropriate  Engagement in Group:  Engaged  Modes of Intervention:  Education, Exploration, Problem-solving and Support  Additional Comments: Pt participated during the therapeutic group activity and completed her wellness wheel word search.   Tania Ade 01/31/2013, 10:50 AM

## 2013-01-31 NOTE — Progress Notes (Signed)
Patient ID: Maureen Swanson, female   DOB: 09/28/1965, 47 y.o.   MRN: [id number] D. The patient has a flat affect and depressed mood. She spent all evening resting in bed with her eyes closed.  A. Encouraged to get out of bed and attend evening AA group. Administered medication. R. The patient did not attend evening group. Remained resting in bed. Compliant with medications.

## 2013-02-03 NOTE — Progress Notes (Signed)
Patient Discharge Instructions:  After Visit Summary (AVS):   Faxed to:  02/03/13 Discharge Summary Note:   Faxed to:  02/03/13 Psychiatric Admission Assessment Note:   Faxed to:  02/03/13 Suicide Risk Assessment - Discharge Assessment:   Faxed to:  02/03/13 Faxed/Sent to the Next Level Care provider:  02/03/13 Faxed to [facility] @ [phone] Faxed to [facility] @ [phone]  Jerelene Redden, 02/03/2013, 2:08 PM

## 2014-02-16 ENCOUNTER — Emergency Department (HOSPITAL_COMMUNITY)
Admission: EM | Admit: 2014-02-16 | Discharge: 2014-02-17 | Disposition: A | Discharge status: Other Acute Inpatient Hospital | Payer: No Typology Code available for payment source | Attending: Emergency Medicine | Admitting: Emergency Medicine

## 2014-02-16 ENCOUNTER — Encounter (HOSPITAL_COMMUNITY): Payer: Self-pay | Admitting: Emergency Medicine

## 2014-02-16 DIAGNOSIS — F1410 Cocaine abuse, uncomplicated: Secondary | ICD-10-CM | POA: Diagnosis present

## 2014-02-16 DIAGNOSIS — Z862 Personal history of diseases of the blood and blood-forming organs and certain disorders involving the immune mechanism: Secondary | ICD-10-CM | POA: Insufficient documentation

## 2014-02-16 DIAGNOSIS — J45909 Unspecified asthma, uncomplicated: Secondary | ICD-10-CM | POA: Insufficient documentation

## 2014-02-16 DIAGNOSIS — F329 Major depressive disorder, single episode, unspecified: Secondary | ICD-10-CM

## 2014-02-16 DIAGNOSIS — Z87891 Personal history of nicotine dependence: Secondary | ICD-10-CM | POA: Insufficient documentation

## 2014-02-16 DIAGNOSIS — R000 Tachycardia, unspecified: Secondary | ICD-10-CM | POA: Insufficient documentation

## 2014-02-16 DIAGNOSIS — Z8739 Personal history of other diseases of the musculoskeletal system and connective tissue: Secondary | ICD-10-CM | POA: Insufficient documentation

## 2014-02-16 DIAGNOSIS — F251 Schizoaffective disorder, depressive type: Secondary | ICD-10-CM | POA: Diagnosis present

## 2014-02-16 DIAGNOSIS — Z79899 Other long term (current) drug therapy: Secondary | ICD-10-CM | POA: Insufficient documentation

## 2014-02-16 DIAGNOSIS — Z8669 Personal history of other diseases of the nervous system and sense organs: Secondary | ICD-10-CM | POA: Insufficient documentation

## 2014-02-16 DIAGNOSIS — I10 Essential (primary) hypertension: Secondary | ICD-10-CM | POA: Insufficient documentation

## 2014-02-16 DIAGNOSIS — R45851 Suicidal ideations: Secondary | ICD-10-CM

## 2014-02-16 DIAGNOSIS — F411 Generalized anxiety disorder: Secondary | ICD-10-CM | POA: Insufficient documentation

## 2014-02-16 DIAGNOSIS — E119 Type 2 diabetes mellitus without complications: Secondary | ICD-10-CM | POA: Insufficient documentation

## 2014-02-16 DIAGNOSIS — F1020 Alcohol dependence, uncomplicated: Secondary | ICD-10-CM | POA: Diagnosis present

## 2014-02-16 DIAGNOSIS — F3130 Bipolar disorder, current episode depressed, mild or moderate severity, unspecified: Secondary | ICD-10-CM | POA: Insufficient documentation

## 2014-02-16 DIAGNOSIS — Z8639 Personal history of other endocrine, nutritional and metabolic disease: Secondary | ICD-10-CM | POA: Insufficient documentation

## 2014-02-16 DIAGNOSIS — Z880 Allergy status to penicillin: Secondary | ICD-10-CM | POA: Insufficient documentation

## 2014-02-16 DIAGNOSIS — F1910 Other psychoactive substance abuse, uncomplicated: Secondary | ICD-10-CM

## 2014-02-16 DIAGNOSIS — F32A Depression, unspecified: Secondary | ICD-10-CM

## 2014-02-16 DIAGNOSIS — G43909 Migraine, unspecified, not intractable, without status migrainosus: Secondary | ICD-10-CM | POA: Insufficient documentation

## 2014-02-16 DIAGNOSIS — K219 Gastro-esophageal reflux disease without esophagitis: Secondary | ICD-10-CM | POA: Insufficient documentation

## 2014-02-16 LAB — URINALYSIS, ROUTINE W REFLEX MICROSCOPIC
BILIRUBIN URINE: NEGATIVE
Glucose, UA: NEGATIVE mg/dL
Hgb urine dipstick: NEGATIVE
KETONES UR: NEGATIVE mg/dL
NITRITE: NEGATIVE
PH: 5 (ref 5.0–8.0)
Protein, ur: 30 mg/dL — AB
SPECIFIC GRAVITY, URINE: 1.021 (ref 1.005–1.030)
UROBILINOGEN UA: 0.2 mg/dL (ref 0.0–1.0)

## 2014-02-16 LAB — COMPREHENSIVE METABOLIC PANEL
ALBUMIN: 3.9 g/dL (ref 3.5–5.2)
ALT: 15 U/L (ref 0–35)
ANION GAP: 21 — AB (ref 5–15)
AST: 26 U/L (ref 0–37)
Alkaline Phosphatase: 77 U/L (ref 39–117)
BILIRUBIN TOTAL: 0.7 mg/dL (ref 0.3–1.2)
BUN: 12 mg/dL (ref 6–23)
CALCIUM: 9.8 mg/dL (ref 8.4–10.5)
CHLORIDE: 102 meq/L (ref 96–112)
CO2: 19 mEq/L (ref 19–32)
CREATININE: 1.02 mg/dL (ref 0.50–1.10)
GFR calc Af Amer: 74 mL/min — ABNORMAL LOW (ref >90)
GFR, EST NON AFRICAN AMERICAN: 64 mL/min — AB (ref >90)
Glucose, Bld: 117 mg/dL — ABNORMAL HIGH (ref 70–99)
Potassium: 3.5 mEq/L — ABNORMAL LOW (ref 3.7–5.3)
Sodium: 142 mEq/L (ref 137–147)
Total Protein: 8.2 g/dL (ref 6.0–8.3)

## 2014-02-16 LAB — CBC WITH DIFFERENTIAL/PLATELET
BASOS ABS: 0 10*3/uL (ref 0.0–0.1)
BASOS PCT: 0 % (ref 0–1)
EOS ABS: 0.1 10*3/uL (ref 0.0–0.7)
EOS PCT: 1 % (ref 0–5)
HEMATOCRIT: 36.6 % (ref 36.0–46.0)
HEMOGLOBIN: 11.8 g/dL — AB (ref 12.0–15.0)
Lymphocytes Relative: 34 % (ref 12–46)
Lymphs Abs: 2.5 10*3/uL (ref 0.7–4.0)
MCH: 28.8 pg (ref 26.0–34.0)
MCHC: 32.2 g/dL (ref 30.0–36.0)
MCV: 89.3 fL (ref 78.0–100.0)
MONO ABS: 0.5 10*3/uL (ref 0.1–1.0)
MONOS PCT: 7 % (ref 3–12)
Neutro Abs: 4.4 10*3/uL (ref 1.7–7.7)
Neutrophils Relative %: 58 % (ref 43–77)
Platelets: 217 10*3/uL (ref 150–400)
RBC: 4.1 MIL/uL (ref 3.87–5.11)
RDW: 12.9 % (ref 11.5–15.5)
WBC: 7.5 10*3/uL (ref 4.0–10.5)

## 2014-02-16 LAB — RAPID URINE DRUG SCREEN, HOSP PERFORMED
Amphetamines: NOT DETECTED
BARBITURATES: NOT DETECTED
BENZODIAZEPINES: NOT DETECTED
COCAINE: POSITIVE — AB
Opiates: NOT DETECTED
TETRAHYDROCANNABINOL: NOT DETECTED

## 2014-02-16 LAB — TROPONIN I

## 2014-02-16 LAB — URINE MICROSCOPIC-ADD ON

## 2014-02-16 LAB — KETONES, QUALITATIVE: ACETONE BLD: NEGATIVE

## 2014-02-16 LAB — ACETAMINOPHEN LEVEL: Acetaminophen (Tylenol), Serum: <15 ug/mL (ref 10–30)

## 2014-02-16 LAB — ETHANOL: Alcohol, Ethyl (B): 75 mg/dL — ABNORMAL HIGH (ref 0–11)

## 2014-02-16 LAB — POC URINE PREG, ED: Preg Test, Ur: NEGATIVE

## 2014-02-16 LAB — SALICYLATE LEVEL: Salicylate Lvl: <2 mg/dL — ABNORMAL LOW (ref 2.8–20.0)

## 2014-02-16 LAB — CBG MONITORING, ED: GLUCOSE-CAPILLARY: 126 mg/dL — AB (ref 70–99)

## 2014-02-16 MED ORDER — SODIUM CHLORIDE 0.9 % IV BOLUS (SEPSIS)
1000.0000 mL | Freq: Once | INTRAVENOUS | Status: AC
  Administered 2014-02-16: 1000 mL via INTRAVENOUS
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
## 2014-02-16 NOTE — ED Notes (Signed)
No sitters available, pt will be placed in the [facility] when cleared
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
## 2014-02-16 NOTE — ED Notes (Signed)
Pt belongings include:  [employer] and exercise psychologist top [employer]

## 2014-02-16 NOTE — ED Notes (Signed)
Pt. And belongings searched and wanded by security.pt. In paper scrubs. Pt. Has 1 belongings bag. Pt. Belongings locked up at the nurses station in the yellow zone.

## 2014-02-17 ENCOUNTER — Inpatient Hospital Stay (HOSPITAL_COMMUNITY)
Admission: AD | Admit: 2014-02-17 | Discharge: 2014-03-02 | DRG: 885 | Disposition: A | Discharge status: Home / Self Care | Payer: No Typology Code available for payment source | Source: Intra-hospital | Attending: Psychiatry | Admitting: Psychiatry

## 2014-02-17 ENCOUNTER — Encounter (HOSPITAL_COMMUNITY): Payer: Self-pay | Admitting: Psychiatry

## 2014-02-17 DIAGNOSIS — F259 Schizoaffective disorder, unspecified: Secondary | ICD-10-CM | POA: Diagnosis present

## 2014-02-17 DIAGNOSIS — F1010 Alcohol abuse, uncomplicated: Secondary | ICD-10-CM

## 2014-02-17 DIAGNOSIS — K219 Gastro-esophageal reflux disease without esophagitis: Secondary | ICD-10-CM | POA: Diagnosis present

## 2014-02-17 DIAGNOSIS — Z609 Problem related to social environment, unspecified: Secondary | ICD-10-CM | POA: Diagnosis not present

## 2014-02-17 DIAGNOSIS — J45909 Unspecified asthma, uncomplicated: Secondary | ICD-10-CM | POA: Diagnosis present

## 2014-02-17 DIAGNOSIS — F919 Conduct disorder, unspecified: Secondary | ICD-10-CM | POA: Diagnosis present

## 2014-02-17 DIAGNOSIS — Z833 Family history of diabetes mellitus: Secondary | ICD-10-CM | POA: Diagnosis not present

## 2014-02-17 DIAGNOSIS — G4700 Insomnia, unspecified: Secondary | ICD-10-CM | POA: Diagnosis present

## 2014-02-17 DIAGNOSIS — F319 Bipolar disorder, unspecified: Secondary | ICD-10-CM | POA: Diagnosis present

## 2014-02-17 DIAGNOSIS — M109 Gout, unspecified: Secondary | ICD-10-CM | POA: Diagnosis present

## 2014-02-17 DIAGNOSIS — Z87891 Personal history of nicotine dependence: Secondary | ICD-10-CM | POA: Diagnosis not present

## 2014-02-17 DIAGNOSIS — M129 Arthropathy, unspecified: Secondary | ICD-10-CM | POA: Diagnosis present

## 2014-02-17 DIAGNOSIS — E7800 Pure hypercholesterolemia, unspecified: Secondary | ICD-10-CM | POA: Diagnosis present

## 2014-02-17 DIAGNOSIS — IMO0002 Reserved for concepts with insufficient information to code with codable children: Secondary | ICD-10-CM

## 2014-02-17 DIAGNOSIS — F1020 Alcohol dependence, uncomplicated: Secondary | ICD-10-CM | POA: Diagnosis present

## 2014-02-17 DIAGNOSIS — E119 Type 2 diabetes mellitus without complications: Secondary | ICD-10-CM | POA: Diagnosis present

## 2014-02-17 DIAGNOSIS — F1420 Cocaine dependence, uncomplicated: Secondary | ICD-10-CM

## 2014-02-17 DIAGNOSIS — F22 Delusional disorders: Secondary | ICD-10-CM | POA: Diagnosis present

## 2014-02-17 DIAGNOSIS — Z91199 Patient's noncompliance with other medical treatment and regimen due to unspecified reason: Secondary | ICD-10-CM | POA: Diagnosis not present

## 2014-02-17 DIAGNOSIS — F329 Major depressive disorder, single episode, unspecified: Secondary | ICD-10-CM

## 2014-02-17 DIAGNOSIS — Z23 Encounter for immunization: Secondary | ICD-10-CM

## 2014-02-17 DIAGNOSIS — F1410 Cocaine abuse, uncomplicated: Secondary | ICD-10-CM | POA: Diagnosis present

## 2014-02-17 DIAGNOSIS — R45850 Homicidal ideations: Secondary | ICD-10-CM | POA: Diagnosis not present

## 2014-02-17 DIAGNOSIS — F411 Generalized anxiety disorder: Secondary | ICD-10-CM | POA: Diagnosis present

## 2014-02-17 DIAGNOSIS — R45851 Suicidal ideations: Secondary | ICD-10-CM

## 2014-02-17 DIAGNOSIS — F10239 Alcohol dependence with withdrawal, unspecified: Secondary | ICD-10-CM | POA: Diagnosis present

## 2014-02-17 DIAGNOSIS — F10939 Alcohol use, unspecified with withdrawal, unspecified: Secondary | ICD-10-CM | POA: Diagnosis present

## 2014-02-17 DIAGNOSIS — F32A Depression, unspecified: Secondary | ICD-10-CM | POA: Diagnosis present

## 2014-02-17 DIAGNOSIS — I10 Essential (primary) hypertension: Secondary | ICD-10-CM | POA: Diagnosis present

## 2014-02-17 DIAGNOSIS — F3289 Other specified depressive episodes: Secondary | ICD-10-CM

## 2014-02-17 DIAGNOSIS — F250 Schizoaffective disorder, bipolar type: Secondary | ICD-10-CM

## 2014-02-17 DIAGNOSIS — Z9119 Patient's noncompliance with other medical treatment and regimen: Secondary | ICD-10-CM

## 2014-02-17 LAB — I-STAT CG4 LACTIC ACID, ED
LACTIC ACID, VENOUS: 0.62 mmol/L (ref 0.5–2.2)
LACTIC ACID, VENOUS: 2.16 mmol/L (ref 0.5–2.2)

## 2014-02-17 LAB — BASIC METABOLIC PANEL
ANION GAP: 16 — AB (ref 5–15)
BUN: 13 mg/dL (ref 6–23)
CHLORIDE: 105 meq/L (ref 96–112)
CO2: 21 mEq/L (ref 19–32)
Calcium: 8.6 mg/dL (ref 8.4–10.5)
Creatinine, Ser: 0.95 mg/dL (ref 0.50–1.10)
GFR, EST AFRICAN AMERICAN: 81 mL/min — AB (ref >90)
GFR, EST NON AFRICAN AMERICAN: 70 mL/min — AB (ref >90)
Glucose, Bld: 113 mg/dL — ABNORMAL HIGH (ref 70–99)
POTASSIUM: 3.9 meq/L (ref 3.7–5.3)
SODIUM: 142 meq/L (ref 137–147)

## 2014-02-17 LAB — I-STAT TROPONIN, ED: TROPONIN I, POC: 0 ng/mL (ref 0.00–0.08)

## 2014-02-17 MED ORDER — HYDROCHLOROTHIAZIDE 25 MG PO TABS
25.0000 mg | ORAL_TABLET | Freq: Every day | ORAL | Status: DC
  Administered 2014-02-17: 25 mg via ORAL
  Filled 2014-02-17: qty 1

## 2014-02-17 MED ORDER — ALBUTEROL SULFATE HFA 108 (90 BASE) MCG/ACT IN AERS: 2.0000 | INHALATION_SPRAY | Freq: Four times a day (QID) | RESPIRATORY_TRACT | Status: DC | PRN

## 2014-02-17 MED ORDER — LAMOTRIGINE 100 MG PO TABS
150.0000 mg | ORAL_TABLET | Freq: Every day | ORAL | Status: DC
  Administered 2014-02-17 – 2014-02-18 (×2): 150 mg via ORAL
  Filled 2014-02-17 (×2): qty 1.5
  Filled 2014-02-17 (×2): qty 2
  Filled 2014-02-17: qty 1.5

## 2014-02-17 MED ORDER — HYDRALAZINE HCL 25 MG PO TABS: 25.0000 mg | ORAL_TABLET | Freq: Three times a day (TID) | ORAL | Status: DC

## 2014-02-17 MED ORDER — LORATADINE 10 MG PO TABS
10.0000 mg | ORAL_TABLET | Freq: Every day | ORAL | Status: DC
  Administered 2014-02-17 – 2014-02-18 (×2): 10 mg via ORAL
  Filled 2014-02-17 (×5): qty 1

## 2014-02-17 MED ORDER — ATENOLOL 100 MG PO TABS: 100.0000 mg | ORAL_TABLET | Freq: Every day | ORAL | Status: DC

## 2014-02-17 MED ORDER — LAMOTRIGINE 150 MG PO TABS
150.0000 mg | ORAL_TABLET | Freq: Every day | ORAL | Status: DC
  Administered 2014-02-17: 150 mg via ORAL
  Filled 2014-02-17: qty 1

## 2014-02-17 MED ORDER — NICOTINE 21 MG/24HR TD PT24
21.0000 mg | MEDICATED_PATCH | Freq: Every day | TRANSDERMAL | Status: DC
  Administered 2014-02-18: 21 mg via TRANSDERMAL
  Filled 2014-02-17 (×3): qty 1

## 2014-02-17 MED ORDER — LORAZEPAM 1 MG PO TABS
1.0000 mg | ORAL_TABLET | Freq: Three times a day (TID) | ORAL | Status: DC | PRN
  Administered 2014-02-17: 1 mg via ORAL
  Filled 2014-02-17: qty 1

## 2014-02-17 MED ORDER — HYDROCHLOROTHIAZIDE 25 MG PO TABS
25.0000 mg | ORAL_TABLET | Freq: Every day | ORAL | Status: DC
  Administered 2014-02-17 – 2014-02-18 (×2): 25 mg via ORAL
  Filled 2014-02-17 (×5): qty 1

## 2014-02-17 MED ORDER — PANTOPRAZOLE SODIUM 40 MG PO TBEC: 40.0000 mg | DELAYED_RELEASE_TABLET | Freq: Every day | ORAL | Status: DC

## 2014-02-17 MED ORDER — OXCARBAZEPINE 300 MG PO TABS
300.0000 mg | ORAL_TABLET | Freq: Two times a day (BID) | ORAL | Status: DC
  Administered 2014-02-17 – 2014-02-18 (×3): 300 mg via ORAL
  Filled 2014-02-17: qty 1
  Filled 2014-02-17: qty 2
  Filled 2014-02-17: qty 1
  Filled 2014-02-17: qty 2
  Filled 2014-02-17: qty 1
  Filled 2014-02-17: qty 2
  Filled 2014-02-17: qty 1

## 2014-02-17 MED ORDER — QUETIAPINE FUMARATE ER 400 MG PO TB24: 400.0000 mg | ORAL_TABLET | Freq: Every day | ORAL | Status: DC

## 2014-02-17 MED ORDER — MAGNESIUM HYDROXIDE 400 MG/5ML PO SUSP: 30.0000 mL | Freq: Every day | ORAL | Status: DC | PRN

## 2014-02-17 MED ORDER — LORAZEPAM 1 MG PO TABS: 0.0000 mg | ORAL_TABLET | Freq: Four times a day (QID) | ORAL | Status: DC

## 2014-02-17 MED ORDER — LORAZEPAM 1 MG PO TABS: 0.0000 mg | ORAL_TABLET | Freq: Two times a day (BID) | ORAL | Status: DC

## 2014-02-17 MED ORDER — ATENOLOL 100 MG PO TABS
100.0000 mg | ORAL_TABLET | Freq: Every day | ORAL | Status: DC
  Administered 2014-02-17 – 2014-02-18 (×2): 100 mg via ORAL
  Filled 2014-02-17: qty 1
  Filled 2014-02-17: qty 4
  Filled 2014-02-17: qty 1
  Filled 2014-02-17 (×2): qty 4
  Filled 2014-02-17: qty 1

## 2014-02-17 MED ORDER — TRAZODONE HCL 50 MG PO TABS
50.0000 mg | ORAL_TABLET | Freq: Every evening | ORAL | Status: DC | PRN
  Administered 2014-02-17: 50 mg via ORAL
  Filled 2014-02-17: qty 1

## 2014-02-17 MED ORDER — CIPROFLOXACIN HCL 500 MG PO TABS
500.0000 mg | ORAL_TABLET | Freq: Two times a day (BID) | ORAL | Status: DC
  Administered 2014-02-17 – 2014-02-18 (×3): 500 mg via ORAL
  Filled 2014-02-17: qty 1
  Filled 2014-02-17: qty 2
  Filled 2014-02-17: qty 1
  Filled 2014-02-17 (×2): qty 2
  Filled 2014-02-17 (×2): qty 1

## 2014-02-17 MED ORDER — BUPROPION HCL ER (SR) 100 MG PO TB12
200.0000 mg | ORAL_TABLET | Freq: Two times a day (BID) | ORAL | Status: DC
  Administered 2014-02-17 – 2014-02-18 (×3): 200 mg via ORAL
  Filled 2014-02-17 (×7): qty 2

## 2014-02-17 MED ORDER — HYDRALAZINE HCL 25 MG PO TABS
25.0000 mg | ORAL_TABLET | Freq: Three times a day (TID) | ORAL | Status: DC
  Administered 2014-02-17: 25 mg via ORAL
  Filled 2014-02-17 (×3): qty 1

## 2014-02-17 MED ORDER — VITAMIN B-1 100 MG PO TABS: 100.0000 mg | ORAL_TABLET | Freq: Every day | ORAL | Status: DC

## 2014-02-17 MED ORDER — ATENOLOL 100 MG PO TABS
100.0000 mg | ORAL_TABLET | Freq: Every day | ORAL | Status: DC
  Administered 2014-02-17: 100 mg via ORAL
  Filled 2014-02-17: qty 1

## 2014-02-17 MED ORDER — LAMOTRIGINE 150 MG PO TABS
150.0000 mg | ORAL_TABLET | Freq: Every day | ORAL | Status: DC
  Filled 2014-02-17 (×3): qty 1

## 2014-02-17 MED ORDER — ALUM & MAG HYDROXIDE-SIMETH 200-200-20 MG/5ML PO SUSP: 30.0000 mL | ORAL | Status: DC | PRN

## 2014-02-17 MED ORDER — HYDROCHLOROTHIAZIDE 25 MG PO TABS: 25.0000 mg | ORAL_TABLET | Freq: Every day | ORAL | Status: DC

## 2014-02-17 MED ORDER — CIPROFLOXACIN HCL 500 MG PO TABS: 500.0000 mg | ORAL_TABLET | Freq: Two times a day (BID) | ORAL | Status: DC

## 2014-02-17 MED ORDER — ACETAMINOPHEN 325 MG PO TABS: 650.0000 mg | ORAL_TABLET | Freq: Four times a day (QID) | ORAL | Status: DC | PRN

## 2014-02-17 MED ORDER — LAMOTRIGINE 150 MG PO TABS: 150.0000 mg | ORAL_TABLET | Freq: Every day | ORAL | Status: DC

## 2014-02-17 MED ORDER — HYDRALAZINE HCL 25 MG PO TABS
25.0000 mg | ORAL_TABLET | Freq: Three times a day (TID) | ORAL | Status: DC
  Administered 2014-02-17 – 2014-02-18 (×4): 25 mg via ORAL
  Filled 2014-02-17 (×9): qty 1

## 2014-02-17 MED ORDER — VITAMIN B-1 100 MG PO TABS
100.0000 mg | ORAL_TABLET | Freq: Every day | ORAL | Status: DC
  Administered 2014-02-18: 100 mg via ORAL
  Filled 2014-02-17 (×3): qty 1

## 2014-02-17 MED ORDER — PANTOPRAZOLE SODIUM 40 MG PO TBEC
40.0000 mg | DELAYED_RELEASE_TABLET | Freq: Every day | ORAL | Status: DC
Start: 2014-02-17 — End: 2014-02-18
  Administered 2014-02-17: 40 mg via ORAL
  Filled 2014-02-17 (×3): qty 1
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
## 2014-02-17 NOTE — Progress Notes (Signed)
Pt transferred per MD orders; pt was given education by RN regarding transfer to [facility] and pt denied any questions or concerns about these instructions; pt pleasant and cooperative; pt escorted by transportation out of facility

## 2014-02-17 NOTE — ED Notes (Signed)
Glasses at bedside.  Patient denies SI, HI, AVH at present. Reports Passive SI and VH. Reports past HI. States her appetite has been poor. Feels that she has lost 8 to 10 lbs in the last month. Rates anxiety 8/10, depression 8/10.   Encouragement offered.   Q 15 safety checks in place.

## 2014-02-17 NOTE — Progress Notes (Signed)
[place],  Provided pt with a [employer] of the [place]. Patient is already seeing Dr. Elbert Ewings but has not enrolled in [facility] program.

## 2014-02-17 NOTE — BH Assessment (Signed)
[facility] Assessment Progress Note Pt accepted to Obs bed 4, accepted by Dr. Darleene Cleaver.  Call report # is [phone].

## 2014-02-17 NOTE — Consult Note (Signed)
[facility] Consult   Reason for Consult:  Suicidal thoughts with intent Referring Physician: EDP Maureen Swanson is an 48 y.o. female. Total Time spent with patient: 45 minutes  Assessment: AXIS I:  Schizoaffective Disorder-depressed              Cocaine abuse. Alcohol abuse AXIS II:  Deferred AXIS III:   Past Medical History  Diagnosis Date  . High cholesterol   . Depression   . Gout   . Anxiety   . Bipolar 1 disorder   . GERD (gastroesophageal reflux disease)   . Substance abuse     crack cocaine  . Headache(784.0)     migraines  . TMJ (temporomandibular joint disorder)   . Asthma     daily and prn inhalers  . Arthritis     back and knees  . Gastric ulcer   . 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 Hypertension     under control, has been on med. x 12 yrs.  . Diabetes mellitus     diet-controlled   AXIS IV:  other psychosocial or environmental problems, problems related to social environment and problems with primary support group AXIS V:  41-50 serious symptoms  Plan:  Supportive therapy provided about ongoing stressors. Discussed crisis plan, support from social network, calling 911, coming to the Emergency Department, and calling Suicide Hotline.  Subjective:   Maureen Swanson is a 48 y.o. female patient admitted with suicidal thoughts with intent to cut her wrist.  HPI: Pt is 48 year old woman with history of schizoaffective disorder who presents voluntarily, she states that she is suicidal with the intent to cut her wrist, she states that she's very depressed and doesn't like her living arrangements. She lives with her boyfriend and he's controlling and she doesn't want to be there anymore. Patient was receiving outpatient services from [facility]. She has not been compliant with her medications but has been self medicating in the past few days with alcohol and cocaine.  HPI Elements:   Location:  depression, suicidal  thoughts. Quality:  moderate symptoms. Timing:  for the past few days. In the context: Tired of living with a controlling boyfriend  Past Psychiatric History: Past Medical History  Diagnosis Date  . High cholesterol   . Depression   . Gout   . Anxiety   . Bipolar 1 disorder   . GERD (gastroesophageal reflux disease)   . Substance abuse     crack cocaine  . Headache(784.0)     migraines  . TMJ (temporomandibular joint disorder)   . Asthma     daily and prn inhalers  . Arthritis     back and knees  . Gastric ulcer   . 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 Hypertension     under control, has been on med. x 12 yrs.  . Diabetes mellitus     diet-controlled    reports that she quit smoking about 18 months ago. She has never used smokeless tobacco. She reports that she uses illicit drugs (Cocaine) about once per week. She reports that she does not drink alcohol. Family History  Problem Relation Age of Onset  . Diabetes Mother   . Cancer Mother     breast  . Cancer Father            Allergies:   Allergies  Allergen Reactions  . Chocolate Hives  .  Orange Hives    "Acid foods"  . Penicillins Hives  . Tomato Hives    "acid foods"    ACT Assessment Complete:  Yes:    Educational Status    Risk to Self: Risk to self with the past 6 months Is patient at risk for suicide?: Yes Substance abuse history and/or treatment for substance abuse?: No  Risk to Others:    Abuse:    Prior Inpatient Therapy:    Prior Outpatient Therapy:    Additional Information:                    Objective: Blood pressure 142/93, pulse 93, temperature 98.2 F (36.8 C), temperature source Oral, resp. rate 18, height _0  (1.626 m), weight 92.987 kg (205 lb), SpO2 95.00%.Body mass index is 35.17 kg/(m^2). Results for orders placed during the hospital encounter of 02/16/14 (from the past 72 hour(s))  CBC WITH DIFFERENTIAL     Status: Abnormal   Collection  Time    02/16/14  9:09 PM      Result Value Ref Range   WBC 7.5  4.0 - 10.5 K/uL   RBC 4.10  3.87 - 5.11 MIL/uL   Hemoglobin 11.8 (*) 12.0 - 15.0 g/dL   HCT 36.6  36.0 - 46.0 %   MCV 89.3  78.0 - 100.0 fL   MCH 28.8  26.0 - 34.0 pg   MCHC 32.2  30.0 - 36.0 g/dL   RDW 12.9  11.5 - 15.5 %   Platelets 217  150 - 400 K/uL   Neutrophils Relative % 58  43 - 77 %   Neutro Abs 4.4  1.7 - 7.7 K/uL   Lymphocytes Relative 34  12 - 46 %   Lymphs Abs 2.5  0.7 - 4.0 K/uL   Monocytes Relative 7  3 - 12 %   Monocytes Absolute 0.5  0.1 - 1.0 K/uL   Eosinophils Relative 1  0 - 5 %   Eosinophils Absolute 0.1  0.0 - 0.7 K/uL   Basophils Relative 0  0 - 1 %   Basophils Absolute 0.0  0.0 - 0.1 K/uL  COMPREHENSIVE METABOLIC PANEL     Status: Abnormal   Collection Time    02/16/14  9:09 PM      Result Value Ref Range   Sodium 142  137 - 147 mEq/L   Potassium 3.5 (*) 3.7 - 5.3 mEq/L   Chloride 102  96 - 112 mEq/L   CO2 19  19 - 32 mEq/L   Glucose, Bld 117 (*) 70 - 99 mg/dL   BUN 12  6 - 23 mg/dL   Creatinine, Ser 1.02  0.50 - 1.10 mg/dL   Calcium 9.8  8.4 - 10.5 mg/dL   Total Protein 8.2  6.0 - 8.3 g/dL   Albumin 3.9  3.5 - 5.2 g/dL   AST 26  0 - 37 U/L   ALT 15  0 - 35 U/L   Alkaline Phosphatase 77  39 - 117 U/L   Total Bilirubin 0.7  0.3 - 1.2 mg/dL   GFR calc non Af Amer 64 (*) >90 mL/min   GFR calc Af Amer 74 (*) >90 mL/min   Comment: (NOTE)     The eGFR has been calculated using the CKD EPI equation.     This calculation has not been validated in all clinical situations.     eGFR's persistently <90 mL/min signify possible Chronic Kidney     Disease.  Anion gap 21 (*) 5 - 15  ACETAMINOPHEN LEVEL     Status: None   Collection Time    02/16/14  9:09 PM      Result Value Ref Range   Acetaminophen (Tylenol), Serum <15.0  10 - 30 ug/mL   Comment:            THERAPEUTIC CONCENTRATIONS VARY     SIGNIFICANTLY. A RANGE OF 10-30     ug/mL MAY BE AN EFFECTIVE     CONCENTRATION FOR MANY  PATIENTS.     HOWEVER, SOME ARE BEST TREATED     AT CONCENTRATIONS OUTSIDE THIS     RANGE.     ACETAMINOPHEN CONCENTRATIONS     >150 ug/mL AT 4 HOURS AFTER     INGESTION AND >50 ug/mL AT 12     HOURS AFTER INGESTION ARE     OFTEN ASSOCIATED WITH TOXIC     REACTIONS.  SALICYLATE LEVEL     Status: Abnormal   Collection Time    02/16/14  9:09 PM      Result Value Ref Range   Salicylate Lvl <8.2 (*) 2.8 - 20.0 mg/dL  ETHANOL     Status: Abnormal   Collection Time    02/16/14  9:09 PM      Result Value Ref Range   Alcohol, Ethyl (B) 75 (*) 0 - 11 mg/dL   Comment:            LOWEST DETECTABLE LIMIT FOR     SERUM ALCOHOL IS 11 mg/dL     FOR MEDICAL PURPOSES ONLY  TROPONIN I     Status: None   Collection Time    02/16/14  9:09 PM      Result Value Ref Range   Troponin I <0.30  <0.30 ng/mL   Comment:            Due to the release kinetics of cTnI,     a negative result within the first hours     of the onset of symptoms does not rule out     myocardial infarction with certainty.     If myocardial infarction is still suspected,     repeat the test at appropriate intervals.  CBG MONITORING, ED     Status: Abnormal   Collection Time    02/16/14  9:14 PM      Result Value Ref Range   Glucose-Capillary 126 (*) 70 - 99 mg/dL  URINALYSIS, ROUTINE W REFLEX MICROSCOPIC     Status: Abnormal   Collection Time    02/16/14  9:17 PM      Result Value Ref Range   Color, Urine YELLOW  YELLOW   APPearance CLOUDY (*) CLEAR   Specific Gravity, Urine 1.021  1.005 - 1.030   pH 5.0  5.0 - 8.0   Glucose, UA NEGATIVE  NEGATIVE mg/dL   Hgb urine dipstick NEGATIVE  NEGATIVE   Bilirubin Urine NEGATIVE  NEGATIVE   Ketones, ur NEGATIVE  NEGATIVE mg/dL   Protein, ur 30 (*) NEGATIVE mg/dL   Urobilinogen, UA 0.2  0.0 - 1.0 mg/dL   Nitrite NEGATIVE  NEGATIVE   Leukocytes, UA LARGE (*) NEGATIVE  URINE RAPID DRUG SCREEN (HOSP PERFORMED)     Status: Abnormal   Collection Time    02/16/14  9:17 PM       Result Value Ref Range   Opiates NONE DETECTED  NONE DETECTED   Cocaine POSITIVE (*) NONE DETECTED   Benzodiazepines NONE  DETECTED  NONE DETECTED   Amphetamines NONE DETECTED  NONE DETECTED   Tetrahydrocannabinol NONE DETECTED  NONE DETECTED   Barbiturates NONE DETECTED  NONE DETECTED   Comment:            DRUG SCREEN FOR MEDICAL PURPOSES     ONLY.  IF CONFIRMATION IS NEEDED     FOR ANY PURPOSE, NOTIFY LAB     WITHIN 5 DAYS.                LOWEST DETECTABLE LIMITS     FOR URINE DRUG SCREEN     Drug Class       Cutoff (ng/mL)     Amphetamine      1000     Barbiturate      200     Benzodiazepine   053     Tricyclics       976     Opiates          300     Cocaine          300     THC              50  URINE MICROSCOPIC-ADD ON     Status: Abnormal   Collection Time    02/16/14  9:17 PM      Result Value Ref Range   Squamous Epithelial / LPF MANY (*) RARE   WBC, UA 3-6  <3 WBC/hpf   Bacteria, UA MANY (*) RARE  POC URINE PREG, ED     Status: None   Collection Time    02/16/14  9:23 PM      Result Value Ref Range   Preg Test, Ur NEGATIVE  NEGATIVE   Comment:            THE SENSITIVITY OF THIS     METHODOLOGY IS >24 mIU/mL  KETONES, QUALITATIVE     Status: None   Collection Time    02/16/14 11:03 PM      Result Value Ref Range   Acetone, Bld NEGATIVE  NEGATIVE  I-STAT CG4 LACTIC ACID, ED     Status: None   Collection Time    02/17/14 12:00 AM      Result Value Ref Range   Lactic Acid, Venous 2.16  0.5 - 2.2 mmol/L  BASIC METABOLIC PANEL     Status: Abnormal   Collection Time    02/17/14 12:45 AM      Result Value Ref Range   Sodium 142  137 - 147 mEq/L   Potassium 3.9  3.7 - 5.3 mEq/L   Chloride 105  96 - 112 mEq/L   CO2 21  19 - 32 mEq/L   Glucose, Bld 113 (*) 70 - 99 mg/dL   BUN 13  6 - 23 mg/dL   Creatinine, Ser 0.95  0.50 - 1.10 mg/dL   Calcium 8.6  8.4 - 10.5 mg/dL   GFR calc non Af Amer 70 (*) >90 mL/min   GFR calc Af Amer 81 (*) >90 mL/min   Comment: (NOTE)      The eGFR has been calculated using the CKD EPI equation.     This calculation has not been validated in all clinical situations.     eGFR's persistently <90 mL/min signify possible Chronic Kidney     Disease.   Anion gap 16 (*) 5 - 15  I-STAT TROPOININ, ED     Status: None   Collection Time  02/17/14  2:03 AM      Result Value Ref Range   Troponin i, poc 0.00  0.00 - 0.08 ng/mL   Comment 3            Comment: Due to the release kinetics of cTnI,     a negative result within the first hours     of the onset of symptoms does not rule out     myocardial infarction with certainty.     If myocardial infarction is still suspected,     repeat the test at appropriate intervals.  I-STAT CG4 LACTIC ACID, ED     Status: None   Collection Time    02/17/14  2:05 AM      Result Value Ref Range   Lactic Acid, Venous 0.62  0.5 - 2.2 mmol/L   Labs are reviewed and are pertinent for positive cocaine and BAL of 75.  Current Facility-Administered Medications  Medication Dose Route Frequency Provider Last Rate Last Dose  . albuterol (PROVENTIL HFA;VENTOLIN HFA) 108 (90 BASE) MCG/ACT inhaler 2 puff  2 puff Inhalation Q6H PRN Charlesetta Shanks, MD      . atenolol (TENORMIN) tablet 100 mg  100 mg Oral Daily Charlesetta Shanks, MD   100 mg at 02/17/14 0838  . hydrALAZINE (APRESOLINE) tablet 25 mg  25 mg Oral TID Charlesetta Shanks, MD   25 mg at 02/17/14 0839  . hydrochlorothiazide (HYDRODIURIL) tablet 25 mg  25 mg Oral Daily Charlesetta Shanks, MD   25 mg at 02/17/14 0839  . lamoTRIgine (LAMICTAL) tablet 150 mg  150 mg Oral Daily Charlesetta Shanks, MD   150 mg at 02/17/14 0839  . LORazepam (ATIVAN) tablet 0-4 mg  0-4 mg Oral 4 times per day Marissa Sciacca, PA-C      . LORazepam (ATIVAN) tablet 0-4 mg  0-4 mg Oral Q12H Marissa Sciacca, PA-C      . pantoprazole (PROTONIX) EC tablet 40 mg  40 mg Oral QHS Charlesetta Shanks, MD      . thiamine (VITAMIN B-1) tablet 100 mg  100 mg Oral Daily Marissa Sciacca, PA-C        Current Outpatient Prescriptions  Medication Sig Dispense Refill  . albuterol (PROVENTIL HFA;VENTOLIN HFA) 108 (90 BASE) MCG/ACT inhaler Inhale 2 puffs into the lungs every 6 (six) hours as needed for wheezing or shortness of breath.      Marland Kitchen atenolol (TENORMIN) 100 MG tablet Take 1 tablet (100 mg total) by mouth daily. For hypertension      . buPROPion (WELLBUTRIN SR) 200 MG 12 hr tablet Take 1 tablet (200 mg total) by mouth 2 (two) times daily. For depression  60 tablet  0  . hydrALAZINE (APRESOLINE) 25 MG tablet Take 1 tablet (25 mg total) by mouth 3 (three) times daily. For hypertension      . hydrochlorothiazide (HYDRODIURIL) 25 MG tablet Take 1 tablet (25 mg total) by mouth daily. For hypertension      . hydrOXYzine (ATARAX/VISTARIL) 25 MG tablet Take 1 tablet (25 mg total) by mouth every 6 (six) hours as needed for anxiety.  30 tablet  0  . lamoTRIgine (LAMICTAL) 150 MG tablet Take 1 tablet (150 mg total) by mouth daily. For mood stabilization  30 tablet  0  . loratadine (CLARITIN) 10 MG tablet Take 1 tablet (10 mg total) by mouth daily. (May be purchased from over the counter): For allergies      . Oxcarbazepine (TRILEPTAL) 300 MG tablet Take 1 tablet (300  mg total) by mouth 2 (two) times daily. For mood stabilization  60 tablet  0  . pantoprazole (PROTONIX) 40 MG tablet Take 1 tablet (40 mg total) by mouth at bedtime. For acid reflux      . QUEtiapine (SEROQUEL XR) 400 MG 24 hr tablet Take 400 mg by mouth at bedtime.      . risperiDONE (RISPERDAL) 2 MG tablet Take 1 tablet (2 mg total) by mouth 2 (two) times daily. For mood control  60 tablet  0    Psychiatric Specialty Exam:     Blood pressure 142/93, pulse 93, temperature 98.2 F (36.8 C), temperature source Oral, resp. rate 18, height _0  (1.626 m), weight 92.987 kg (205 lb), SpO2 95.00%.Body mass index is 35.17 kg/(m^2).  General Appearance: Disheveled  Eye Contact::  Minimal  Speech:  Clear and Coherent  Volume:  Decreased   Mood:  Depressed  Affect:  Appropriate  Thought Process:  Goal Directed  Orientation:  Full (Time, Place, and Person)  Thought Content:  Negative  Suicidal Thoughts:  Passive   Homicidal Thoughts:  No  Memory:  Immediate;   Fair Recent;   Fair Remote;   Fair  Judgement:  Impaired  Insight:  Shallow  Psychomotor Activity:  Decreased  Concentration:  Fair  Recall:  [place]: Good  Akathisia:  No  Handed:  Right  AIMS (if indicated):     Assets:  Communication Skills Desire for Improvement Physical Health  Sleep:      Musculoskeletal: Strength & Muscle Tone: within normal limits Gait & Station: normal Patient leans: N/A  Treatment Plan Summary: Transfer to Observation unit for further management  Corena Pilgrim, MD 02/17/2014 10:22 AM

## 2014-02-17 NOTE — Progress Notes (Signed)
Patient is A&Ox4, denies SI/HI, denies A/V hallucinations at this time.  States she feels safe as long as she is here.  Patient is given medications as scheduled and safety checks are done q15 minutes and prn. Emotional support given to patient prn. Patient remains safe.  Royer Cristobal, [place]

## 2014-02-17 NOTE — ED Provider Notes (Signed)
48 year old female with a history of bipolar disorder and hypertension as well as cocaine abuse who presents with a complaint of suicidal ideations. She states that she lives in a very stressful relationship with her boyfriend who she states is the source of her depression. She has had some hallucinations today but denies any suicide attempts. She drank 80 ounces of 45 year and smoke to rocks of cocaine at 6:00 PM, she denies any other ingestion specifically any types of toxic alcohols or other  prescription medications. She denies any self-harm though she does have suicidal thoughts but she has had the past. The patient denies taking her home medications including her antihypertensives recently. She denies any physical pain. She denies nausea vomiting.  Denies diarrhea, fever, chills, cough, shortness of breath, chest pain. The patient will be further evaluation for her suicidal thoughts however at this time the patient does have a slight anion gap, slight tachycardia which has improved significantly between her arrival and on my examination. Her drug screen positive for cocaine, labs are otherwise unremarkable. She will be getting IV fluids and a recheck of her basic metabolic panel to make sure that her anion gap closes. Lactic acid pending, salicylate level negative.   Medical screening examination/treatment/procedure(s) were conducted as a shared visit with non-physician practitioner(s) and myself.  I personally evaluated the patient during the encounter.  Clinical Impression: Suicidal thoughts, substance abuse, increased anion gap        Johnna Acosta, MD 02/18/14 0700

## 2014-02-18 ENCOUNTER — Inpatient Hospital Stay (HOSPITAL_COMMUNITY)
Admission: AD | Admit: 2014-02-18 | Payer: No Typology Code available for payment source | Source: Intra-hospital | Admitting: Psychiatry

## 2014-02-18 DIAGNOSIS — Z833 Family history of diabetes mellitus: Secondary | ICD-10-CM | POA: Diagnosis not present

## 2014-02-18 DIAGNOSIS — K219 Gastro-esophageal reflux disease without esophagitis: Secondary | ICD-10-CM | POA: Diagnosis present

## 2014-02-18 DIAGNOSIS — Z9119 Patient's noncompliance with other medical treatment and regimen: Secondary | ICD-10-CM | POA: Diagnosis not present

## 2014-02-18 DIAGNOSIS — F1410 Cocaine abuse, uncomplicated: Secondary | ICD-10-CM

## 2014-02-18 DIAGNOSIS — Z91199 Patient's noncompliance with other medical treatment and regimen due to unspecified reason: Secondary | ICD-10-CM | POA: Diagnosis not present

## 2014-02-18 DIAGNOSIS — E7800 Pure hypercholesterolemia, unspecified: Secondary | ICD-10-CM | POA: Diagnosis present

## 2014-02-18 DIAGNOSIS — F22 Delusional disorders: Secondary | ICD-10-CM | POA: Diagnosis present

## 2014-02-18 DIAGNOSIS — Z609 Problem related to social environment, unspecified: Secondary | ICD-10-CM | POA: Diagnosis not present

## 2014-02-18 DIAGNOSIS — Z23 Encounter for immunization: Secondary | ICD-10-CM | POA: Diagnosis not present

## 2014-02-18 DIAGNOSIS — M109 Gout, unspecified: Secondary | ICD-10-CM | POA: Diagnosis present

## 2014-02-18 DIAGNOSIS — Z87891 Personal history of nicotine dependence: Secondary | ICD-10-CM | POA: Diagnosis not present

## 2014-02-18 DIAGNOSIS — F329 Major depressive disorder, single episode, unspecified: Secondary | ICD-10-CM

## 2014-02-18 DIAGNOSIS — F10939 Alcohol use, unspecified with withdrawal, unspecified: Secondary | ICD-10-CM | POA: Diagnosis present

## 2014-02-18 DIAGNOSIS — R45850 Homicidal ideations: Secondary | ICD-10-CM | POA: Diagnosis not present

## 2014-02-18 DIAGNOSIS — F1020 Alcohol dependence, uncomplicated: Secondary | ICD-10-CM | POA: Diagnosis present

## 2014-02-18 DIAGNOSIS — G4700 Insomnia, unspecified: Secondary | ICD-10-CM | POA: Diagnosis present

## 2014-02-18 DIAGNOSIS — E119 Type 2 diabetes mellitus without complications: Secondary | ICD-10-CM | POA: Diagnosis present

## 2014-02-18 DIAGNOSIS — M129 Arthropathy, unspecified: Secondary | ICD-10-CM | POA: Diagnosis present

## 2014-02-18 DIAGNOSIS — F411 Generalized anxiety disorder: Secondary | ICD-10-CM | POA: Diagnosis present

## 2014-02-18 DIAGNOSIS — F1010 Alcohol abuse, uncomplicated: Secondary | ICD-10-CM

## 2014-02-18 DIAGNOSIS — F919 Conduct disorder, unspecified: Secondary | ICD-10-CM | POA: Diagnosis present

## 2014-02-18 DIAGNOSIS — R45851 Suicidal ideations: Secondary | ICD-10-CM

## 2014-02-18 DIAGNOSIS — J45909 Unspecified asthma, uncomplicated: Secondary | ICD-10-CM | POA: Diagnosis present

## 2014-02-18 DIAGNOSIS — F259 Schizoaffective disorder, unspecified: Principal | ICD-10-CM

## 2014-02-18 DIAGNOSIS — F319 Bipolar disorder, unspecified: Secondary | ICD-10-CM | POA: Diagnosis present

## 2014-02-18 DIAGNOSIS — IMO0002 Reserved for concepts with insufficient information to code with codable children: Secondary | ICD-10-CM | POA: Diagnosis not present

## 2014-02-18 DIAGNOSIS — I10 Essential (primary) hypertension: Secondary | ICD-10-CM | POA: Diagnosis present

## 2014-02-18 DIAGNOSIS — F10239 Alcohol dependence with withdrawal, unspecified: Secondary | ICD-10-CM | POA: Diagnosis present

## 2014-02-18 LAB — URINE CULTURE
Colony Count: >=100000
Special Requests: NORMAL

## 2014-02-18 MED ORDER — LORAZEPAM 1 MG PO TABS
1.0000 mg | ORAL_TABLET | Freq: Every day | ORAL | Status: AC
  Administered 2014-02-21: 1 mg via ORAL
  Filled 2014-02-18: qty 1

## 2014-02-18 MED ORDER — HYDROCHLOROTHIAZIDE 25 MG PO TABS
25.0000 mg | ORAL_TABLET | Freq: Every day | ORAL | Status: DC
  Administered 2014-02-19 – 2014-03-02 (×12): 25 mg via ORAL
  Filled 2014-02-18 (×14): qty 1

## 2014-02-18 MED ORDER — NICOTINE 21 MG/24HR TD PT24
21.0000 mg | MEDICATED_PATCH | Freq: Every day | TRANSDERMAL | Status: DC
  Administered 2014-02-19 – 2014-03-02 (×11): 21 mg via TRANSDERMAL
  Filled 2014-02-18 (×15): qty 1

## 2014-02-18 MED ORDER — CHLORDIAZEPOXIDE HCL 25 MG PO CAPS
25.0000 mg | ORAL_CAPSULE | Freq: Four times a day (QID) | ORAL | Status: AC | PRN
Start: 2014-02-18 — End: 2014-02-21

## 2014-02-18 MED ORDER — ATENOLOL 100 MG PO TABS
100.0000 mg | ORAL_TABLET | Freq: Every day | ORAL | Status: DC
  Administered 2014-02-19 – 2014-03-02 (×12): 100 mg via ORAL
  Filled 2014-02-18 (×14): qty 1

## 2014-02-18 MED ORDER — LAMOTRIGINE 150 MG PO TABS
150.0000 mg | ORAL_TABLET | Freq: Every day | ORAL | Status: DC
  Administered 2014-02-19 – 2014-02-24 (×6): 150 mg via ORAL
  Filled 2014-02-18 (×7): qty 1

## 2014-02-18 MED ORDER — VITAMIN B-1 100 MG PO TABS
100.0000 mg | ORAL_TABLET | Freq: Every day | ORAL | Status: DC
  Filled 2014-02-18 (×2): qty 1

## 2014-02-18 MED ORDER — RISPERIDONE 1 MG PO TBDP
1.0000 mg | ORAL_TABLET | Freq: Two times a day (BID) | ORAL | Status: DC
  Administered 2014-02-18: 1 mg via ORAL
  Filled 2014-02-18 (×5): qty 1

## 2014-02-18 MED ORDER — LORATADINE 10 MG PO TABS
10.0000 mg | ORAL_TABLET | Freq: Every day | ORAL | Status: DC
  Administered 2014-02-19 – 2014-03-02 (×12): 10 mg via ORAL
  Filled 2014-02-18 (×15): qty 1

## 2014-02-18 MED ORDER — BUPROPION HCL ER (SR) 100 MG PO TB12
200.0000 mg | ORAL_TABLET | Freq: Two times a day (BID) | ORAL | Status: DC
  Administered 2014-02-19 – 2014-02-21 (×5): 200 mg via ORAL
  Filled 2014-02-18 (×7): qty 2

## 2014-02-18 MED ORDER — HYDRALAZINE HCL 25 MG PO TABS
25.0000 mg | ORAL_TABLET | Freq: Three times a day (TID) | ORAL | Status: DC
  Administered 2014-02-19 – 2014-02-26 (×18): 25 mg via ORAL
  Filled 2014-02-18 (×28): qty 1

## 2014-02-18 MED ORDER — PNEUMOCOCCAL VAC POLYVALENT 25 MCG/0.5ML IJ INJ
0.5000 mL | INJECTION | INTRAMUSCULAR | Status: AC
  Administered 2014-02-20: 0.5 mL via INTRAMUSCULAR

## 2014-02-18 MED ORDER — VITAMIN B-1 100 MG PO TABS
100.0000 mg | ORAL_TABLET | Freq: Every day | ORAL | Status: DC
  Administered 2014-02-19 – 2014-03-02 (×12): 100 mg via ORAL
  Filled 2014-02-18 (×15): qty 1

## 2014-02-18 MED ORDER — OLANZAPINE 10 MG PO TBDP
10.0000 mg | ORAL_TABLET | Freq: Once | ORAL | Status: AC
  Administered 2014-02-18: 10 mg via ORAL
  Filled 2014-02-18 (×2): qty 1

## 2014-02-18 MED ORDER — ONDANSETRON 4 MG PO TBDP: 4.0000 mg | ORAL_TABLET | Freq: Four times a day (QID) | ORAL | Status: AC | PRN

## 2014-02-18 MED ORDER — MAGNESIUM HYDROXIDE 400 MG/5ML PO SUSP: 30.0000 mL | Freq: Every day | ORAL | Status: DC | PRN

## 2014-02-18 MED ORDER — QUETIAPINE FUMARATE ER 200 MG PO TB24
ORAL_TABLET | ORAL | Status: AC
  Filled 2014-02-18: qty 2

## 2014-02-18 MED ORDER — BENZTROPINE MESYLATE 1 MG PO TABS
1.0000 mg | ORAL_TABLET | Freq: Two times a day (BID) | ORAL | Status: DC
  Administered 2014-02-19 – 2014-03-02 (×23): 1 mg via ORAL
  Filled 2014-02-18 (×12): qty 1
  Filled 2014-02-18: qty 28
  Filled 2014-02-18: qty 1
  Filled 2014-02-18: qty 28
  Filled 2014-02-18: qty 1
  Filled 2014-02-18: qty 28
  Filled 2014-02-18 (×9): qty 1
  Filled 2014-02-18: qty 28
  Filled 2014-02-18 (×3): qty 1

## 2014-02-18 MED ORDER — OLANZAPINE 5 MG PO TBDP: 5.0000 mg | ORAL_TABLET | Freq: Three times a day (TID) | ORAL | Status: DC | PRN

## 2014-02-18 MED ORDER — CIPROFLOXACIN HCL 500 MG PO TABS
500.0000 mg | ORAL_TABLET | Freq: Two times a day (BID) | ORAL | Status: DC
  Administered 2014-02-19 – 2014-03-02 (×23): 500 mg via ORAL
  Filled 2014-02-18 (×27): qty 1

## 2014-02-18 MED ORDER — PANTOPRAZOLE SODIUM 40 MG PO TBEC
40.0000 mg | DELAYED_RELEASE_TABLET | Freq: Every day | ORAL | Status: DC
  Administered 2014-02-18 – 2014-03-01 (×12): 40 mg via ORAL
  Filled 2014-02-18 (×15): qty 1

## 2014-02-18 MED ORDER — LORAZEPAM 1 MG PO TABS
1.0000 mg | ORAL_TABLET | Freq: Four times a day (QID) | ORAL | Status: AC
  Administered 2014-02-18 (×4): 1 mg via ORAL
  Filled 2014-02-18 (×4): qty 1

## 2014-02-18 MED ORDER — RISPERIDONE 1 MG PO TBDP
1.0000 mg | ORAL_TABLET | Freq: Two times a day (BID) | ORAL | Status: DC
  Filled 2014-02-18 (×3): qty 1

## 2014-02-18 MED ORDER — ALUM & MAG HYDROXIDE-SIMETH 200-200-20 MG/5ML PO SUSP: 30.0000 mL | ORAL | Status: DC | PRN

## 2014-02-18 MED ORDER — LORAZEPAM 1 MG PO TABS
1.0000 mg | ORAL_TABLET | Freq: Three times a day (TID) | ORAL | Status: AC
  Administered 2014-02-19 (×2): 1 mg via ORAL
  Filled 2014-02-18: qty 1

## 2014-02-18 MED ORDER — ACETAMINOPHEN 325 MG PO TABS
650.0000 mg | ORAL_TABLET | Freq: Four times a day (QID) | ORAL | Status: DC | PRN
Start: 2014-02-18 — End: 2014-03-02
  Administered 2014-02-22 – 2014-02-28 (×8): 650 mg via ORAL
  Filled 2014-02-18 (×6): qty 2

## 2014-02-18 MED ORDER — BENZTROPINE MESYLATE 1 MG PO TABS
1.0000 mg | ORAL_TABLET | Freq: Two times a day (BID) | ORAL | Status: DC
  Administered 2014-02-18: 1 mg via ORAL
  Filled 2014-02-18 (×5): qty 1

## 2014-02-18 MED ORDER — LORAZEPAM 1 MG PO TABS
1.0000 mg | ORAL_TABLET | Freq: Two times a day (BID) | ORAL | Status: AC
  Administered 2014-02-20 (×2): 1 mg via ORAL
  Filled 2014-02-18 (×2): qty 1

## 2014-02-18 MED ORDER — TRAZODONE HCL 50 MG PO TABS
50.0000 mg | ORAL_TABLET | Freq: Every evening | ORAL | Status: DC | PRN
  Administered 2014-02-21 – 2014-02-26 (×2): 50 mg via ORAL
  Filled 2014-02-18 (×2): qty 1

## 2014-02-18 MED ORDER — OLANZAPINE 5 MG PO TBDP
ORAL_TABLET | ORAL | Status: AC
  Filled 2014-02-18: qty 1

## 2014-02-18 MED ORDER — HYDROXYZINE HCL 25 MG PO TABS
25.0000 mg | ORAL_TABLET | Freq: Four times a day (QID) | ORAL | Status: DC | PRN
  Administered 2014-02-20 – 2014-02-28 (×7): 25 mg via ORAL
  Filled 2014-02-18 (×9): qty 1
  Filled 2014-02-18: qty 56

## 2014-02-18 MED ORDER — HYDROXYZINE HCL 25 MG PO TABS
ORAL_TABLET | ORAL | Status: AC
  Filled 2014-02-18: qty 1

## 2014-02-18 MED ORDER — QUETIAPINE FUMARATE ER 400 MG PO TB24
400.0000 mg | ORAL_TABLET | Freq: Every day | ORAL | Status: DC
  Administered 2014-02-18 – 2014-02-20 (×3): 400 mg via ORAL
  Filled 2014-02-18 (×4): qty 1

## 2014-02-18 MED ORDER — LOPERAMIDE HCL 2 MG PO CAPS
2.0000 mg | ORAL_CAPSULE | ORAL | Status: AC | PRN
Start: 2014-02-18 — End: 2014-02-21

## 2014-02-18 MED ORDER — ALBUTEROL SULFATE HFA 108 (90 BASE) MCG/ACT IN AERS
2.0000 | INHALATION_SPRAY | Freq: Four times a day (QID) | RESPIRATORY_TRACT | Status: DC | PRN
Start: 2014-02-18 — End: 2014-03-02

## 2014-02-18 MED ORDER — HYDROXYZINE HCL 25 MG PO TABS
25.0000 mg | ORAL_TABLET | Freq: Four times a day (QID) | ORAL | Status: DC | PRN
  Administered 2014-02-18: 25 mg via ORAL

## 2014-02-18 MED ORDER — OXCARBAZEPINE 300 MG PO TABS
300.0000 mg | ORAL_TABLET | Freq: Two times a day (BID) | ORAL | Status: DC
  Administered 2014-02-19 – 2014-02-22 (×7): 300 mg via ORAL
  Filled 2014-02-18 (×9): qty 1

## 2014-02-18 NOTE — Progress Notes (Signed)
Patient awake and alert. Denied SI,AH/VH. Patient denied pain at this time. Due medications given as ordered. Safety maintained by every 15 minutes check. No new complaint. Will continue to monitor patient.
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
## 2014-02-18 NOTE — Discharge Instructions (Signed)

## 2014-02-18 NOTE — Progress Notes (Signed)
CBG @ 0716 was 112

## 2014-02-18 NOTE — Progress Notes (Signed)
Psychoeducational Group Note  Date:  02/18/2014 Time:  2237  Group Topic/Focus:  Wrap-Up Group:   The focus of this group is to help patients review their daily goal of treatment and discuss progress on daily workbooks.  Participation Level: Did Not Attend  Participation Quality:  Not Applicable  Affect:  Not Applicable  Cognitive:  Not Applicable  Insight:  Not Applicable  Engagement in Group: Not Applicable  Additional Comments:  The patient didn't attend group this evening since she was admitted after the group was held.   Archie Balboa S 02/18/2014, 10:37 PM

## 2014-02-18 NOTE — Progress Notes (Signed)
CBG @ 1647 was 109

## 2014-02-18 NOTE — Progress Notes (Signed)
Patient ID: Maureen Swanson, female   DOB: 1965/06/21, 48 y.o.   MRN: [id number] Patient awoke from nap and began complaining of hearing voices tell ing her to hit herself and to hurt herself.  Patient began digging at her arm and stated she couldn't stop.  Patient also stated she was seeing fleeting streaks across her eyes. Patient given ice pack for her arm to put in place to avoid twisting her skin. Patient requested Abilify and a PRN.  Plan to ask NP.

## 2014-02-18 NOTE — Progress Notes (Signed)
Patient discharged from observation unit to Adult inpatient 500 unit at 21:30. Vital sign 98.8, 76, 20, 125/85, 100%. Patient in a stable condition.

## 2014-02-18 NOTE — Progress Notes (Deleted)
CBG @ 1647 was 109

## 2014-02-18 NOTE — Progress Notes (Signed)
Patient ID: Maureen Swanson, female   DOB: [birth year], 48 y.o.   MRN: [id number] Patient denies HI, SI, AVH.  Has been cooperative and alert.  Affect flat and depressed.  Eating well. No withdrawal symptoms or complaints of pain. Medications given as ordered. Will continue to monitor for safety.
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
## 2014-02-18 NOTE — Progress Notes (Signed)
CBG @ 1149 was 112

## 2014-02-18 NOTE — ED Provider Notes (Signed)
Medical screening examination/treatment/procedure(s) were conducted as a shared visit with non-physician practitioner(s) and myself.  I personally evaluated the patient during the encounter  Please see my separate respective documentation pertaining to this patient encounter   Johnna Acosta, MD 02/18/14 0700

## 2014-02-19 ENCOUNTER — Encounter (HOSPITAL_COMMUNITY): Payer: Self-pay | Admitting: *Deleted

## 2014-02-19 LAB — GLUCOSE, CAPILLARY
GLUCOSE-CAPILLARY: 123 mg/dL — AB (ref 70–99)
Glucose-Capillary: 128 mg/dL — ABNORMAL HIGH (ref 70–99)
Glucose-Capillary: 140 mg/dL — ABNORMAL HIGH (ref 70–99)
Glucose-Capillary: 131 mg/dL — ABNORMAL HIGH (ref 70–99)

## 2014-02-19 MED ORDER — LORAZEPAM 2 MG/ML IJ SOLN
2.0000 mg | Freq: Once | INTRAMUSCULAR | Status: AC
  Administered 2014-02-19: 2 mg via INTRAMUSCULAR

## 2014-02-19 MED ORDER — HALOPERIDOL 5 MG PO TABS
5.0000 mg | ORAL_TABLET | Freq: Four times a day (QID) | ORAL | Status: DC | PRN
  Administered 2014-02-19 – 2014-02-23 (×4): 5 mg via ORAL
  Filled 2014-02-19 (×4): qty 1

## 2014-02-19 MED ORDER — OLANZAPINE 10 MG PO TBDP: 10.0000 mg | ORAL_TABLET | Freq: Three times a day (TID) | ORAL | Status: DC | PRN

## 2014-02-19 MED ORDER — LORAZEPAM 1 MG PO TABS
1.0000 mg | ORAL_TABLET | Freq: Four times a day (QID) | ORAL | Status: DC | PRN
  Administered 2014-02-19 – 2014-02-27 (×7): 1 mg via ORAL
  Filled 2014-02-19 (×6): qty 1

## 2014-02-19 MED ORDER — HALOPERIDOL 5 MG PO TABS
ORAL_TABLET | ORAL | Status: AC
  Administered 2014-02-19: 5 mg via ORAL
  Filled 2014-02-19: qty 1

## 2014-02-19 MED ORDER — LORAZEPAM 2 MG/ML IJ SOLN
INTRAMUSCULAR | Status: AC
  Filled 2014-02-19: qty 1

## 2014-02-19 MED ORDER — RISPERIDONE 2 MG PO TBDP
2.0000 mg | ORAL_TABLET | Freq: Two times a day (BID) | ORAL | Status: DC
  Administered 2014-02-19 – 2014-02-21 (×4): 2 mg via ORAL
  Filled 2014-02-19 (×6): qty 1
  Filled 2014-02-19: qty 2

## 2014-02-19 MED ORDER — HALOPERIDOL LACTATE 5 MG/ML IJ SOLN
INTRAMUSCULAR | Status: AC
  Filled 2014-02-19: qty 1

## 2014-02-19 MED ORDER — HALOPERIDOL LACTATE 5 MG/ML IJ SOLN
5.0000 mg | Freq: Once | INTRAMUSCULAR | Status: AC
  Administered 2014-02-19: 5 mg via INTRAMUSCULAR
  Filled 2014-02-19: qty 1

## 2014-02-19 MED ORDER — HALOPERIDOL 5 MG PO TABS
5.0000 mg | ORAL_TABLET | Freq: Four times a day (QID) | ORAL | Status: DC | PRN
  Administered 2014-02-19: 5 mg via ORAL

## 2014-02-19 MED ORDER — OLANZAPINE 10 MG IM SOLR: 10.0000 mg | Freq: Three times a day (TID) | INTRAMUSCULAR | Status: DC | PRN

## 2014-02-19 MED ORDER — LORAZEPAM 1 MG PO TABS: 2.0000 mg | ORAL_TABLET | Freq: Four times a day (QID) | ORAL | Status: DC | PRN

## 2014-02-19 MED ORDER — LORAZEPAM 1 MG PO TABS
ORAL_TABLET | ORAL | Status: AC
  Filled 2014-02-19: qty 1

## 2014-02-19 NOTE — Progress Notes (Signed)
Patient [name] stating she felt her blood sugar was low. CBG obtained and was 128. Pt offered small snack. This morning's reading at 0612 is 123. Maureen Swanson
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
## 2014-02-19 NOTE — [facility] Group Notes (Addendum)
[place] Group Notes:  (Nursing/MHT/Case Management/Adjunct)  Date:  02/19/2014  Time:  1:53 PM  Type of Therapy:  Psychoeducational Skills- Patient Self Inventory Group  Participation Level:  Did Not Attend   Ventura Sellers 02/19/2014, 1:53 PM

## 2014-02-19 NOTE — [facility] Group Notes (Signed)
[place] Group Notes:  Life skills  Date:  02/19/2014  Time:  12:06 PM  Type of Therapy:  Nurse Education  Participation Level:  Did Not Attend  Participation Quality:  Inattentive  Affect:  Depressed  Cognitive:  Lacking  Insight:  None  Engagement in Group:  None  Modes of Intervention:  Education  Summary of Progress/Problems:pt did not attend group  Marcello Moores [facility] 02/19/2014, 12:06 PM
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
## 2014-02-19 NOTE — Progress Notes (Signed)
Psychoeducational Group Note  Date:  02/19/2014 Time:  2206  Group Topic/Focus:  Wrap-Up Group:   The focus of this group is to help patients review their daily goal of treatment and discuss progress on daily workbooks.  Participation Level: Did Not Attend  Participation Quality:  Not Applicable  Affect:  Not Applicable  Cognitive:  Not Applicable  Insight:  Not Applicable  Engagement in Group: Not Applicable  Additional Comments:  The patient did not attend group this evening since she was asleep in the quiet room.   Larya Charpentier S 02/19/2014, 10:06 PM

## 2014-02-19 NOTE — Progress Notes (Signed)
Psychoeducational Group Note  Date:  02/19/2014 Time:  1500 Group Topic/Focus:  Making Healthy Choices:   The focus of this group is to help patients identify negative/unhealthy choices they were using prior to admission and identify positive/healthier coping strategies to replace them upon discharge. Also teach relaxation breathing and progressive relaxation skills  Participation Level:  Did not attend  Paulino Rily 02/19/2014

## 2014-02-19 NOTE — Progress Notes (Addendum)
NUTRITION ASSESSMENT  Pt identified as at risk on the Malnutrition Screen Tool  INTERVENTION: CHO MOD diet as tolerated Supplements: thiamine daily   Assessment:  Patient admitted with schizoaffective disorder, cocaine and etoh abuse. Hx includes DM and elevated cholesterol.  Patient is in quite room sleeping on 1:1 suicide watch.  Tech reports patient with good intake.  Weight has increased.  48 y.o. female  Height: Ht Readings from Last 1 Encounters:  02/17/14 5\' 4"  (1.626 m)    Weight: Wt Readings from Last 1 Encounters:  02/17/14 202 lb (91.627 kg)    Weight Hx: Wt Readings from Last 10 Encounters:  02/17/14 202 lb (91.627 kg)  02/16/14 205 lb (92.987 kg)  01/28/13 181 lb (82.101 kg)  09/08/12 191 lb (86.637 kg)  08/14/12 203 lb (92.08 kg)  04/19/12 185 lb (83.915 kg)  04/19/12 185 lb (83.915 kg)  04/12/12 185 lb 9.6 oz (84.188 kg)  02/19/12 174 lb (78.926 kg)  09/25/11 180 lb (81.647 kg)    BMI:  Body mass index is 34.66 kg/(m^2). Pt meets criteria for obesity grade 1 based on current BMI.  Estimated Nutritional Needs: Kcal: 25-30 kcal/kg Protein: > 1 gram protein/kg Fluid: 1 ml/kcal  Diet Order: Carb Control Pt is also offered choice of unit snacks mid-morning and mid-afternoon.  Pt is eating as desired.   Lab results and medications reviewed.   Antonieta Iba, RD, LDN Clinical Inpatient Dietitian Pager:  [phone] Weekend and after hours pager:  [phone]

## 2014-02-19 NOTE — [facility] Group Notes (Signed)
[place] Group Notes: (Clinical Social Work)   02/19/2014      Type of Therapy:  Group Therapy   Participation Level:  Did Not Attend    Selmer Dominion, LCSW 02/19/2014, 2:57 PM
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
## 2014-02-19 NOTE — Plan of Care (Signed)
Problem: Ineffective individual coping Goal: STG: Patient will remain free from self harm Outcome: Not Progressing Patient attempting to harm herself this morning with command AH. She was placed on a 1:1 and medicated. Still with passive SI.  Problem: Alteration in mood & ability to function due to Goal: STG-Patient will report withdrawal symptoms Outcome: Progressing Patient denying withdrawal.  Problem: Diagnosis: Increased Risk For Suicide Attempt Goal: STG-Patient Will Comply With Medication Regime Outcome: Progressing Patient has been med compliant and able to take po medications.
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
## 2014-02-19 NOTE — Progress Notes (Signed)
Pt observed resting quietly with eyes closed in the quiet room. RR WNL, even and unlabored. Light, audible snoring.  Per MHT at bedside, pt did awake asking about snack. MHT reassured pt she would be offered snack shortly. This RN will coordinate interventions for the evening to limit stimulation to patient. She remains on 1:1 for safety and she is safe at this time. Maureen Swanson

## 2014-02-20 LAB — GLUCOSE, CAPILLARY
GLUCOSE-CAPILLARY: 128 mg/dL — AB (ref 70–99)
Glucose-Capillary: 109 mg/dL — ABNORMAL HIGH (ref 70–99)
Glucose-Capillary: 123 mg/dL — ABNORMAL HIGH (ref 70–99)
Glucose-Capillary: 156 mg/dL — ABNORMAL HIGH (ref 70–99)

## 2014-02-20 NOTE — Progress Notes (Signed)
D- Patient received diabetic lunch from cafeteia and ate on unit.  .  She has been up and talked to MD and Education officer, museum.  She is unsteady at times when walking.  1:1 Continues for safety and fall prevention.  She received pneumonia shot and understands that her arm might be sore for a few days.  She says that she thinks she might want to followup at [facility], and social worker was notified.

## 2014-02-20 NOTE — Progress Notes (Addendum)
Patient awake at present but remains quite groggy/sleepy. Observed reaching for various items on floor. Asking frequently for snacks, fluids. Has toileted with staff assist x 3. Remains unsteady and requires much help ambulating. Denies pain and voices no complaints.  1:1 obs remains in place for patient's safety and she is safe at present. Will continue to monitor closely. Jamie Kato   Add note: Pt up to tub and completed hygiene with assist of MHT. Scrubs changed and pt is now in dayroom getting coffee. Pt remains unsteady but is more alert and is calm at this point in time. Fall precautions reviewed again and strongly encouraged. Pt verbalizes understanding. 1:1 obs remains in place for patient's safety and pt is safe. Jamie Kato

## 2014-02-20 NOTE — [facility] Suicide Risk Assessment (Signed)
[place] INPATIENT:  Family/Significant Other Suicide Prevention Education  Suicide Prevention Education:  Patient Refusal for Family/Significant Other Suicide Prevention Education: The patient MAEBELLE SULTON has refused to provide written consent for family/significant other to be provided Family/Significant Other Suicide Prevention Education during admission and/or prior to discharge.  Physician notified.  Darrin Apodaca, Casimiro Needle 02/20/2014, 1:25 PM

## 2014-02-20 NOTE — Tx Team (Signed)
Initial Interdisciplinary Treatment Plan   PATIENT STRESSORS: Financial difficulties Marital or family conflict Substance abuse   PROBLEM LIST: Problem List/Patient Goals Date to be addressed Date deferred Reason deferred Estimated date of resolution  Suicidal thoughts 02/20/14   D/c  "improved attitude" 02/20/14   D/c                                              DISCHARGE CRITERIA:  Ability to meet basic life and health needs Improved stabilization in mood, thinking, and/or behavior Reduction of life-threatening or endangering symptoms to within safe limits Withdrawal symptoms are absent or subacute and managed without 24-hour nursing intervention  PRELIMINARY DISCHARGE PLAN: Attend aftercare/continuing care group Attend 12-step recovery group Outpatient therapy  PATIENT/FAMIILY INVOLVEMENT: This treatment plan has been presented to and reviewed with the patient, Maureen Swanson, .  The patient has been given the opportunity to ask questions and make suggestions. 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    Clayborn Heron 02/20/2014, 3:00 PM

## 2014-02-20 NOTE — Progress Notes (Signed)
[facility] MD Progress Note  02/20/2014 12:50 PM Maureen Swanson  MRN:  [id number] Subjective:    Patient states she feels " about the same". She endorses  Depression/ hallucinations, although better than upon admission. Objective:  48 year old female admitted to due to suicidal ideations and command hallucinations. Agitated upon admission, with behaviors such as punching walls.  At this time she is on a 1:1 obs level for safety. She states she is still hearing voices telling her to hurt " myself and people", but does not currently appear internally preoccupied, but denies any current plan or intention to hurt self at the present time. She denies medication side effects. Limited milieu/group participation. Today behavior calm, no gross agitation or disruptive behaviors. Superficially cooperative, affect blunted.   Diagnosis:  Schizoaffective Disorder, Cocaine Abuse, Alcohol Abuse  Total Time spent with patient: 25 minutes   ADL's:  fair  Sleep: fair   Appetite:  "OK"  Suicidal Ideation:  Currently describes passive thoughts of being better off dead, but denies current suicidal plan or intent Homicidal Ideation:  Currently denies  AEB (as evidenced by):  Psychiatric Specialty Exam: Physical Exam  Review of Systems  Constitutional: Negative for fever and chills.  Respiratory: Negative for cough and shortness of breath.   Psychiatric/Behavioral: Positive for depression, suicidal ideas, hallucinations and substance abuse.    Blood pressure 147/95, pulse 82, temperature 98.4 F (36.9 C), temperature source Oral, resp. rate 20, height 5\' 4"  (1.626 m), weight 91.627 kg (202 lb), SpO2 100.00%.Body mass index is 34.66 kg/(m^2).  General Appearance: Fairly Groomed  Engineer, water::  Fair  Speech:  Slow  Volume:  Normal  Mood:  Depressed  Affect:  Blunt  Thought Process:  slowed, does not appear particularly disorganized or loose  Orientation:  NA- but alert and attentive  Thought Content:   Hallucinations: Auditory Command:  states voices tell her to hurt herself and other people  Suicidal Thoughts:  Yes.  without intent/plan- at this time states she has thoughts of dying but no plan or intention to hurt self. States she is having no current homicidal thoughts   Homicidal Thoughts:  No  Memory:  NA  Judgement:  Impaired  Insight:  Lacking  Psychomotor Activity:  Decreased  Concentration:  Fair  Recall:  Good  Fund of Knowledge:Good  Language: Fair  Akathisia:  Negative  Handed:  Right  AIMS (if indicated):     Assets:  Desire for Improvement Resilience  Sleep:  Number of Hours: 5.5   Musculoskeletal: Strength & Muscle Tone: within normal limits Gait & Station: normal Patient leans: N/A  Current Medications: Current Facility-Administered Medications  Medication Dose Route Frequency Provider Last Rate Last Dose  . acetaminophen (TYLENOL) tablet 650 mg  650 mg Oral Q6H PRN Dara Hoyer, PA-C      . albuterol (PROVENTIL HFA;VENTOLIN HFA) 108 (90 BASE) MCG/ACT inhaler 2 puff  2 puff Inhalation Q6H PRN Dara Hoyer, PA-C      . alum & mag hydroxide-simeth (MAALOX/MYLANTA) 200-200-20 MG/5ML suspension 30 mL  30 mL Oral Q4H PRN Dara Hoyer, PA-C      . atenolol (TENORMIN) tablet 100 mg  100 mg Oral Daily Dara Hoyer, PA-C   100 mg at 02/20/14 0827  . benztropine (COGENTIN) tablet 1 mg  1 mg Oral BID Dara Hoyer, PA-C   1 mg at 02/20/14 4166  . buPROPion (WELLBUTRIN SR) 12 hr tablet 200 mg  200 mg Oral BID Juanda Crumble  Marva Panda, PA-C   200 mg at 02/20/14 0817  . chlordiazePOXIDE (LIBRIUM) capsule 25 mg  25 mg Oral Q6H PRN Dara Hoyer, PA-C      . ciprofloxacin (CIPRO) tablet 500 mg  500 mg Oral BID Dara Hoyer, PA-C   500 mg at 02/20/14 0818  . LORazepam (ATIVAN) tablet 1 mg  1 mg Oral Q6H PRN Benjamine Mola, FNP   1 mg at 02/19/14 2056   And  . haloperidol (HALDOL) tablet 5 mg  5 mg Oral Q6H PRN Benjamine Mola, FNP   5 mg at 02/19/14 2057  . hydrALAZINE  (APRESOLINE) tablet 25 mg  25 mg Oral TID Dara Hoyer, PA-C   25 mg at 02/20/14 1146  . hydrochlorothiazide (HYDRODIURIL) tablet 25 mg  25 mg Oral Daily Dara Hoyer, PA-C   25 mg at 02/20/14 0818  . hydrOXYzine (ATARAX/VISTARIL) tablet 25 mg  25 mg Oral Q6H PRN Dara Hoyer, PA-C   25 mg at 02/20/14 1009  . lamoTRIgine (LAMICTAL) tablet 150 mg  150 mg Oral Daily Dara Hoyer, PA-C   150 mg at 02/20/14 0817  . loperamide (IMODIUM) capsule 2-4 mg  2-4 mg Oral PRN Dara Hoyer, PA-C      . loratadine (CLARITIN) tablet 10 mg  10 mg Oral Daily Dara Hoyer, PA-C   10 mg at 02/20/14 0818  . LORazepam (ATIVAN) tablet 1 mg  1 mg Oral BID Neita Garnet, MD   1 mg at 02/20/14 0973   Followed by  . [START ON 02/21/2014] LORazepam (ATIVAN) tablet 1 mg  1 mg Oral Daily Neita Garnet, MD      . magnesium hydroxide (MILK OF MAGNESIA) suspension 30 mL  30 mL Oral Daily PRN Dara Hoyer, PA-C      . nicotine (NICODERM CQ - dosed in mg/24 hours) patch 21 mg  21 mg Transdermal Q0600 Dara Hoyer, PA-C   21 mg at 02/20/14 5329  . ondansetron (ZOFRAN-ODT) disintegrating tablet 4 mg  4 mg Oral Q6H PRN Dara Hoyer, PA-C      . Oxcarbazepine (TRILEPTAL) tablet 300 mg  300 mg Oral BID Dara Hoyer, PA-C   300 mg at 02/20/14 0818  . pantoprazole (PROTONIX) EC tablet 40 mg  40 mg Oral QHS Dara Hoyer, PA-C   40 mg at 02/19/14 2056  . pneumococcal 23 valent vaccine (PNU-IMMUNE) injection 0.5 mL  0.5 mL Intramuscular Tomorrow-1000 Neita Garnet, MD      . QUEtiapine (SEROQUEL XR) 24 hr tablet 400 mg  400 mg Oral QHS Dara Hoyer, PA-C   400 mg at 02/19/14 2057  . risperiDONE (RISPERDAL M-TABS) disintegrating tablet 2 mg  2 mg Oral BID Benjamine Mola, FNP   2 mg at 02/20/14 9242  . thiamine (VITAMIN B-1) tablet 100 mg  100 mg Oral Daily Dara Hoyer, PA-C   100 mg at 02/20/14 0818  . traZODone (DESYREL) tablet 50 mg  50 mg Oral QHS PRN Dara Hoyer, PA-C        Lab Results:   Results for orders placed during the hospital encounter of 02/17/14 (from the past 48 hour(s))  GLUCOSE, CAPILLARY     Status: Abnormal   Collection Time    02/19/14  3:48 AM      Result Value Ref Range   Glucose-Capillary 128 (*) 70 - 99 mg/dL  GLUCOSE, CAPILLARY     Status: Abnormal  Collection Time    02/19/14  6:12 AM      Result Value Ref Range   Glucose-Capillary 123 (*) 70 - 99 mg/dL   Comment 1 Notify RN     Comment 2 Documented in Chart    GLUCOSE, CAPILLARY     Status: Abnormal   Collection Time    02/19/14  5:12 PM      Result Value Ref Range   Glucose-Capillary 140 (*) 70 - 99 mg/dL   Comment 1 Notify RN    GLUCOSE, CAPILLARY     Status: Abnormal   Collection Time    02/19/14  8:52 PM      Result Value Ref Range   Glucose-Capillary 131 (*) 70 - 99 mg/dL   Comment 1 Notify RN     Comment 2 Documented in Chart    GLUCOSE, CAPILLARY     Status: Abnormal   Collection Time    02/20/14  6:01 AM      Result Value Ref Range   Glucose-Capillary 109 (*) 70 - 99 mg/dL   Comment 1 Notify RN     Comment 2 Documented in Chart    GLUCOSE, CAPILLARY     Status: Abnormal   Collection Time    02/20/14 11:57 AM      Result Value Ref Range   Glucose-Capillary 123 (*) 70 - 99 mg/dL   Comment 1 Documented in Chart     Comment 2 Notify RN      Physical Findings: AIMS: Facial and Oral Movements Muscles of Facial Expression: None, normal Lips and Perioral Area: None, normal Jaw: None, normal Tongue: None, normal,Extremity Movements Upper (arms, wrists, hands, fingers): None, normal Lower (legs, knees, ankles, toes): None, normal, Trunk Movements Neck, shoulders, hips: None, normal, Overall Severity Severity of abnormal movements (highest score from questions above): None, normal Incapacitation due to abnormal movements: None, normal Patient's awareness of abnormal movements (rate only patient's report): No Awareness, Dental Status Current problems with teeth and/or  dentures?: No Does patient usually wear dentures?: No  CIWA:  CIWA-Ar Total: 1 COWS:  COWS Total Score: 1  Assessment: At this time patient calm, behavior in control, in bed. Describes depression, command hallucinations telling her to hurt self and others, and ongoing passive suicidal thougths. Therefore, in spite  Of no current plan or intention of hurting herself or anyone else at this time, I think prudent course of action is to continue 1:1 OBS until further stabilized  Treatment Plan Summary: Daily contact with patient to assess and evaluate symptoms and progress in treatment Medication management See below  Plan: Continue inpatient treatment. Continue Wellbutrin SR at 200 mgrs BID Continue Lamictal 150 mgrs QDAY Continue Ativan taper Continue Seroquel  XR 400 mgrs QHS Continue Risperidone 2  mgrs BID Continue Trileptal 300 mgrs BID Continue one to one observation level at this time. Consider gradually tapering Risperidone as she is already on Seroquel XR/Trileptal   Medical Decision Making Problem Points:  Established problem, stable/improving (1), Review of last therapy session (1) and Review of psycho-social stressors (1) Data Points:  Review or order clinical lab tests (1) Review of medication regiment & side effects (2) Review of new medications or change in dosage (2)  I certify that inpatient services furnished can reasonably be expected to improve the patient's condition.   Javel Hersh 02/20/2014, 12:50 PM

## 2014-02-20 NOTE — Progress Notes (Signed)
2000 RN 1:1 Note D: Pt in bed resting. Pt's respirations even and unlabored. Pt appears to be in no sings of distress at this time.  A: 1:1 observations remains for this pt.  R: Pt remains safe at this time.
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
## 2014-02-20 NOTE — Progress Notes (Addendum)
Pt resting quietly at this time in the quiet room. Even, soft and unlabored RR. Pt still has periods of awakening where she presents suddenly agitated and appears to experience internal stimuli. She does redirect with support from 1:1 MHT at bedside and prn haldol and ativan given at 2045 with good results. Observed for EPS and none noted. She was given a snack, VS obtained and WNL and CIWA is a "0." Snack provided along with brief diabetic diet education as patient was asking for ice cream. While awake she denied AVH for this writer however continues to endorse SI. Pt was assisted to the bathroom x 2 and pt remains very unsteady on her feet (pt was identified as high fall risk on admit and precautions already in place). 1:1 continues for patient safety and she remains safe. Jamie Kato  Add note: Explained to patient at time of med admin that urine analysis indicated the presence of a UTI. Pt denied symptoms however confirmed understanding of initiation of antibiotic therapy as well as need to push fluids. Pitcher at bedside and pt urinating without difficulty. Jamie Kato

## 2014-02-20 NOTE — Progress Notes (Addendum)
Patient ID: Maureen Swanson, female   DOB: September 18, 1965, 48 y.o.   MRN: [id number] 1:1 observation note:  Patient moved to 400 hall without incident.  Patient has been up in the day room watching TV.  She continues to have thoughts of hurting others; reports symptoms have decreased.  Patient continues to have depressive symptoms and with passive SI. She is compliant with her medications.  She is currently eating her dinner in the day room with 1:1 sitter.  Patient continues to be 1;1 observation due to safety.

## 2014-02-20 NOTE — BHH Group Notes (Addendum)
[facility] LCSW Aftercare Discharge Planning Group Note  02/20/2014  8:45 AM  Participation Quality: Did Not Attend.  Tilden Fossa, MSW, [place] Worker [facility] [phone]

## 2014-02-20 NOTE — Progress Notes (Signed)
Patient ID: Maureen Swanson, female   DOB: 05-Jan-1966, 48 y.o.   MRN: [id number] 1:1 Nursing note. Patient reports fair sleep and fair appetite.  She is rating depression at 9/10 and anxiety at 8/10. She is having suicidal ideation at times.   She is a fall risk due to feeling lightheaded.  A- Reviewed fall precautions with patient and sitter.  Talked with patient about her suicidal thoughts.  R- Patient agrees to try to substitute healthy comforting  thoughts for negative ones.  She is tired today and is resting between groups.  She denies having any hallucinations or dreams today, but says they are scarey when she has them.  Talked with her about risperdal and how it is to prevent hallucinations.

## 2014-02-20 NOTE — Clinical Social Work Note (Signed)
CSW provided patient with local shelter listing as she is recently homeless.   Tilden Fossa, MSW, [place] Worker [facility] [phone]

## 2014-02-21 DIAGNOSIS — F319 Bipolar disorder, unspecified: Secondary | ICD-10-CM

## 2014-02-21 LAB — GLUCOSE, CAPILLARY
GLUCOSE-CAPILLARY: 109 mg/dL — AB (ref 70–99)
GLUCOSE-CAPILLARY: 130 mg/dL — AB (ref 70–99)
Glucose-Capillary: 119 mg/dL — ABNORMAL HIGH (ref 70–99)
Glucose-Capillary: 112 mg/dL — ABNORMAL HIGH (ref 70–99)
Glucose-Capillary: 132 mg/dL — ABNORMAL HIGH (ref 70–99)
Glucose-Capillary: 100 mg/dL — ABNORMAL HIGH (ref 70–99)
Glucose-Capillary: 148 mg/dL — ABNORMAL HIGH (ref 70–99)
Glucose-Capillary: 135 mg/dL — ABNORMAL HIGH (ref 70–99)

## 2014-02-21 MED ORDER — RISPERIDONE 2 MG PO TBDP
2.0000 mg | ORAL_TABLET | Freq: Every day | ORAL | Status: DC
  Administered 2014-02-22 – 2014-02-26 (×5): 2 mg via ORAL
  Filled 2014-02-21 (×6): qty 1

## 2014-02-21 MED ORDER — QUETIAPINE FUMARATE ER 300 MG PO TB24
600.0000 mg | ORAL_TABLET | Freq: Every day | ORAL | Status: DC
  Administered 2014-02-21 – 2014-02-23 (×3): 600 mg via ORAL
  Filled 2014-02-21 (×4): qty 2

## 2014-02-21 MED ORDER — NAPROXEN 500 MG PO TABS
500.0000 mg | ORAL_TABLET | Freq: Two times a day (BID) | ORAL | Status: AC
  Administered 2014-02-21 – 2014-02-24 (×6): 500 mg via ORAL
  Filled 2014-02-21 (×6): qty 1

## 2014-02-21 NOTE — Progress Notes (Signed)
Adult Psychoeducational Group Note  Date:  02/21/2014 Time:  10:09 PM  Group Topic/Focus:  Wrap-Up Group:   The focus of this group is to help patients review their daily goal of treatment and discuss progress on daily workbooks.  Participation Level:  Minimal  Participation Quality:  Drowsy  Affect:  Flat  Cognitive:  Lacking  Insight: Limited  Engagement in Group:  Limited  Modes of Intervention:  Socialization  Additional Comments:  Patient attended and participated in group tonight. She reports that today she slept a lot because she did not sleep the night before. She spoke with her son today, eat her meals and pray.   Maureen Swanson [facility] 02/21/2014, 10:09 PM

## 2014-02-21 NOTE — Progress Notes (Signed)
RN 1:1 Note  D: Patient is resting in the bed with eyes closed; respirations are even and unlabored  A: Continue 1:1 observation  R: Patient has no acute distress at this time

## 2014-02-21 NOTE — Progress Notes (Signed)
[facility] MD Progress Note  02/21/2014 11:06 AM Maureen Swanson  MRN:  [id number] Subjective:    Patient states she feels depressed and irritable. Reports sleep as an issue ,had only 3 hrs of sleep last night. Objective:  48 year old female admitted to due to suicidal ideations and command hallucinations. Agitated upon admission, with behaviors such as punching walls.  At this time she is on a 1:1 obs level for safety.She will also be on a 'do not admit" in her room since she is unpredictable and can be very irritable and intrusive. She states she is still hearing voices telling her to hurt " myself and people". She also reports SI with a plan to shoot self or cut self and reports she "cannot contract for safety" . She denies medication side effects. Limited milieu/group participation.   Diagnosis:  DSM 5  Primary psychiatric diagnosis Schizoaffective disorder,bipolar type ,multiple episodes ,currently in acute episode  Secondary psychiatric diagnosis: Alcohol use disorder Alcohol withdrawal Cocaine use disorder  Non psychiatric diagnosis; GERD (gastroesophageal reflux disease)  Migraines  TMJ (temporomandibular joint disorder)  Asthma  Arthritis   Gastric ulcer per hx Atypical ductal hyperplasia of breast (03/2012) Hypertension Diabetes mellitus  diet-controlled    Total Time spent with patient: 25 minutes   ADL's:  fair  Sleep: poor  Appetite:  "OK"  Suicidal Ideation:  Currently describes SI with plan to cut or shoot self ,cannot contract for safety Homicidal Ideation:  Currently denies  AEB (as evidenced by):  Psychiatric Specialty Exam: Physical Exam  Review of Systems  Constitutional: Negative for fever and chills.  Respiratory: Negative for cough and shortness of breath.   Psychiatric/Behavioral: Positive for depression, suicidal ideas, hallucinations and substance abuse.    Blood pressure 129/86, pulse 82, temperature 97.8 F (36.6 C), temperature source Oral,  resp. rate 16, height 5\' 4"  (1.626 m), weight 91.627 kg (202 lb), SpO2 99.00%.Body mass index is 34.66 kg/(m^2).  General Appearance: Fairly Groomed  Engineer, water::  Poor  Speech:  Slow  Volume:  Normal  Mood:  Depressed  Affect:  Blunt  Thought Process:  slowed, does not appear particularly disorganized or loose  Orientation:  NA- but alert and attentive  Thought Content:  Hallucinations: Auditory Command:  states voices tell her to hurt herself and other people  Suicidal Thoughts:  Yes.  with intent/plan will cut self or shoot self" I cannot contract for safety".  Homicidal Thoughts:  No  Memory:  NA  Judgement:  Impaired  Insight:  Lacking  Psychomotor Activity:  Decreased  Concentration:  Fair  Recall:  Good  Fund of Knowledge:Good  Language: Fair  Akathisia:  Negative  Handed:  Right  AIMS (if indicated):     Assets:  Desire for Improvement Resilience  Sleep:  Number of Hours: 0.75   Musculoskeletal: Strength & Muscle Tone: within normal limits Gait & Station: normal Patient leans: N/A  Current Medications: Current Facility-Administered Medications  Medication Dose Route Frequency Provider Last Rate Last Dose  . acetaminophen (TYLENOL) tablet 650 mg  650 mg Oral Q6H PRN Dara Hoyer, PA-C      . albuterol (PROVENTIL HFA;VENTOLIN HFA) 108 (90 BASE) MCG/ACT inhaler 2 puff  2 puff Inhalation Q6H PRN Dara Hoyer, PA-C      . alum & mag hydroxide-simeth (MAALOX/MYLANTA) 200-200-20 MG/5ML suspension 30 mL  30 mL Oral Q4H PRN Dara Hoyer, PA-C      . atenolol (TENORMIN) tablet 100 mg  100 mg  Oral Daily Dara Hoyer, PA-C   100 mg at 02/21/14 5732  . benztropine (COGENTIN) tablet 1 mg  1 mg Oral BID Dara Hoyer, PA-C   1 mg at 02/21/14 2025  . chlordiazePOXIDE (LIBRIUM) capsule 25 mg  25 mg Oral Q6H PRN Dara Hoyer, PA-C      . ciprofloxacin (CIPRO) tablet 500 mg  500 mg Oral BID Dara Hoyer, PA-C   500 mg at 02/21/14 0817  . LORazepam (ATIVAN) tablet  1 mg  1 mg Oral Q6H PRN Benjamine Mola, FNP   1 mg at 02/21/14 4270   And  . haloperidol (HALDOL) tablet 5 mg  5 mg Oral Q6H PRN Benjamine Mola, FNP   5 mg at 02/21/14 6237  . hydrALAZINE (APRESOLINE) tablet 25 mg  25 mg Oral TID Dara Hoyer, PA-C   25 mg at 02/21/14 6283  . hydrochlorothiazide (HYDRODIURIL) tablet 25 mg  25 mg Oral Daily Dara Hoyer, PA-C   25 mg at 02/21/14 0816  . hydrOXYzine (ATARAX/VISTARIL) tablet 25 mg  25 mg Oral Q6H PRN Dara Hoyer, PA-C   25 mg at 02/20/14 1009  . lamoTRIgine (LAMICTAL) tablet 150 mg  150 mg Oral Daily Dara Hoyer, PA-C   150 mg at 02/21/14 0815  . loperamide (IMODIUM) capsule 2-4 mg  2-4 mg Oral PRN Dara Hoyer, PA-C      . loratadine (CLARITIN) tablet 10 mg  10 mg Oral Daily Dara Hoyer, PA-C   10 mg at 02/21/14 0817  . magnesium hydroxide (MILK OF MAGNESIA) suspension 30 mL  30 mL Oral Daily PRN Dara Hoyer, PA-C      . naproxen (NAPROSYN) tablet 500 mg  500 mg Oral BID WC Staphany Ditton, MD      . nicotine (NICODERM CQ - dosed in mg/24 hours) patch 21 mg  21 mg Transdermal Q0600 Dara Hoyer, PA-C   21 mg at 02/21/14 0818  . ondansetron (ZOFRAN-ODT) disintegrating tablet 4 mg  4 mg Oral Q6H PRN Dara Hoyer, PA-C      . Oxcarbazepine (TRILEPTAL) tablet 300 mg  300 mg Oral BID Dara Hoyer, PA-C   300 mg at 02/21/14 0816  . pantoprazole (PROTONIX) EC tablet 40 mg  40 mg Oral QHS Dara Hoyer, PA-C   40 mg at 02/20/14 2144  . QUEtiapine (SEROQUEL XR) 24 hr tablet 600 mg  600 mg Oral QHS [name], MD      . Derrill Memo ON 02/22/2014] risperiDONE (RISPERDAL M-TABS) disintegrating tablet 2 mg  2 mg Oral QHS Tiona Ruane, MD      . thiamine (VITAMIN B-1) tablet 100 mg  100 mg Oral Daily Dara Hoyer, PA-C   100 mg at 02/21/14 0816  . traZODone (DESYREL) tablet 50 mg  50 mg Oral QHS PRN Dara Hoyer, PA-C   50 mg at 02/21/14 0240    Lab Results:  Results for orders placed during the hospital encounter of  02/17/14 (from the past 48 hour(s))  GLUCOSE, CAPILLARY     Status: Abnormal   Collection Time    02/19/14  5:12 PM      Result Value Ref Range   Glucose-Capillary 140 (*) 70 - 99 mg/dL   Comment 1 Notify RN    GLUCOSE, CAPILLARY     Status: Abnormal   Collection Time    02/19/14  8:52 PM      Result Value Ref  Range   Glucose-Capillary 131 (*) 70 - 99 mg/dL   Comment 1 Notify RN     Comment 2 Documented in Chart    GLUCOSE, CAPILLARY     Status: Abnormal   Collection Time    02/20/14  6:01 AM      Result Value Ref Range   Glucose-Capillary 109 (*) 70 - 99 mg/dL   Comment 1 Notify RN     Comment 2 Documented in Chart    GLUCOSE, CAPILLARY     Status: Abnormal   Collection Time    02/20/14 11:57 AM      Result Value Ref Range   Glucose-Capillary 123 (*) 70 - 99 mg/dL   Comment 1 Documented in Chart     Comment 2 Notify RN    GLUCOSE, CAPILLARY     Status: Abnormal   Collection Time    02/20/14  4:56 PM      Result Value Ref Range   Glucose-Capillary 128 (*) 70 - 99 mg/dL  GLUCOSE, CAPILLARY     Status: Abnormal   Collection Time    02/20/14  9:07 PM      Result Value Ref Range   Glucose-Capillary 156 (*) 70 - 99 mg/dL   Comment 1 Notify RN    GLUCOSE, CAPILLARY     Status: Abnormal   Collection Time    02/21/14  7:39 AM      Result Value Ref Range   Glucose-Capillary 100 (*) 70 - 99 mg/dL   Comment 1 Notify RN      Physical Findings: AIMS: Facial and Oral Movements Muscles of Facial Expression: None, normal Lips and Perioral Area: None, normal Jaw: None, normal Tongue: None, normal,Extremity Movements Upper (arms, wrists, hands, fingers): None, normal Lower (legs, knees, ankles, toes): None, normal, Trunk Movements Neck, shoulders, hips: None, normal, Overall Severity Severity of abnormal movements (highest score from questions above): None, normal Incapacitation due to abnormal movements: None, normal Patient's awareness of abnormal movements (rate only  patient's report): No Awareness, Dental Status Current problems with teeth and/or dentures?: No Does patient usually wear dentures?: No  CIWA:  CIWA-Ar Total: 3 COWS:  COWS Total Score: 1  Assessment:  Patient continues to be irritable ,hallucinating ,intrusive and paranoid ,reports having a plan to cut self or shoot self and reports not wanting to contract for safety.    Treatment Plan Summary: Daily contact with patient to assess and evaluate symptoms and progress in treatment Medication management See below  Plan: Continue inpatient treatment. Will DC Wellbutrin ,since it can cause irritability. She is already on two mood stabilizers. Continue Lamictal 150 mg QDAY. Could titrate dose higher. Continue CIWA protocol. Increase  Seroquel XR to 600 mg po qhs. Reduce Risperidone to 2 mg po qhs. Could DC once stable on seroquel. Continue Trileptal 300 mgrs BID. Continue one to one observation level at this time. Continue HCTZ 25 mg po daily ,Hydralazine 25 mg po tid for HTN. Continue Hydroxyzine prn for anxiety.    Medical Decision Making Problem Points:  Established problem, stable/improving (1), Review of last therapy session (1) and Review of psycho-social stressors (1) Data Points:  Review or order clinical lab tests (1) Review of medication regiment & side effects (2) Review of new medications or change in dosage (2)  I certify that inpatient services furnished can reasonably be expected to improve the patient's condition.   Nicco Reaume 02/21/2014, 11:06 AM

## 2014-02-21 NOTE — Tx Team (Signed)
  Interdisciplinary Treatment Plan Update   Date Reviewed:  02/21/2014  Time Reviewed:  8:27 AM  Progress in Treatment:   Attending groups: No Participating in groups:No Taking medication as prescribed: Yes  Tolerating medication: Yes Family/Significant other contact made: No Patient understands diagnosis: Yes AEB asking for help with racing thoughts, SI with plan, psychosis and anxiety Discussing patient identified problems/goals with staff: Yes  See initial care plan Medical problems stabilized or resolved: Yes Denies suicidal/homicidal ideation:No  Is on 1:1 due to inability to contract for safety Patient has not harmed self or others: Yes  For review of initial/current patient goals, please see plan of care.  Estimated Length of Stay:  4-5 days  Reason for Continuation of Hospitalization: Anxiety Depression Hallucinations Medication stabilization Suicidal ideation  New Problems/Goals identified:  N/A  Discharge Plan or Barriers:   unknown  Additional Comments:  Maureen Swanson is a 48 y.o. female patient admitted with suicidal thoughts with intent to cut her wrist. Pt seen and chart reviewed. Pt affirms active thoughts of hurting herself and others, citing command auditory hallucinations to do so. Pt was very anxious in the OBS UNIT and now also on the regular inpatient unit as well. Pt was seen in the quiet room as she has been having behavior disturbances, stating that she is "having terrible dreams and when I wake up, the voices tell me to hurt myself and hurt other people". Pt has been punching walls, crying, and is difficult to console. Positive for SI, HI, and AH, denying VH at this time, cannot contract for safety and is on a 1:1.   Pt is 48 year old woman with history of schizoaffective disorder who presents voluntarily, she states that she is suicidal with the intent to cut her wrist, she states that she's very depressed and doesn't like her living arrangements. She lives  with her boyfriend and he's controlling and she doesn't want to be there anymore. Patient was receiving outpatient services from [facility]. She has not been compliant with her medications but has been self medicating in the past few days with alcohol and cocaine.   Attendees:  Signature: Steva Colder, MD 02/21/2014 8:27 AM   Signature: Ripley Fraise, LCSW 02/21/2014 8:27 AM  Signature: Elmarie Shiley, NP 02/21/2014 8:27 AM  Signature: Mayra Neer, RN 02/21/2014 8:27 AM  Signature: Darrol Angel, RN 02/21/2014 8:27 AM  Signature:  02/21/2014 8:27 AM  Signature:   02/21/2014 8:27 AM  Signature:    Signature:    Signature:    Signature:    Signature:    Signature:      Scribe for Treatment Team:   Ripley Fraise, LCSW  02/21/2014 8:27 AM

## 2014-02-21 NOTE — Progress Notes (Signed)
RN Note 1:1  D: Patient is resting with eyes closed. Patient respirations ate even and unlabored  A: Continue 1:1 observation; will check vital signs   R: Patient has no acute needs at this time; patient has no other needs at this time

## 2014-02-21 NOTE — Progress Notes (Addendum)
D: Pt denies any AVH/HI. Pt endorses SI. Pt verbally contracts for safety. Pt is disorganized and tangential. Pt remained in her room for the majority of the shift. Pt verbalized awareness of her qhs medication regimen. Pt became increasingly agitated and confused as the night progressed. Pt had to be redirected several times on not attempting to get out of bed without staff. Pt often wanted to get up to search for random items in her room. Pt required constant redirection. Pt was not responsive to her first prn dose of Ativan. Pt received a dose of Haldol and Trazodone 2 hours later. This combination of med decreased this pt's restlessness.  A: Writer administered scheduled and prn medications to pt. Continued support and availability as needed was extended to this pt. Staff continue to monitor pt with q69min checks.  R: No adverse drug reactions noted. Pt remains safe at this time.

## 2014-02-21 NOTE — Progress Notes (Signed)
D: Pt in bed resting with eyes closed. Respirations even and unlabored. Pt appears to be in no signs of distress at this time. A: 1:1 observation continues for this pt.  R: Pt remains safe at this time.

## 2014-02-21 NOTE — Progress Notes (Signed)
D: Pt attempting to get out of bed to find her socks. Chief Strategy Officer assisted the pt back into the bed. Pt presented unsteady in her movements. New socks were placed on this pt's feet. Writer offered to take pt to the bathroom as needed. Pt instructed to inform staff of any need to get out of the bed.  A: 1:1 observation remains for this pt. R: Pt remains safe at this time.

## 2014-02-21 NOTE — Progress Notes (Signed)
The focus of this group is to educate the patient on the purpose and policies of crisis stabilization and provide a format to answer questions about their admission.  The group details unit policies and expectations of patients while admitted. Patient did not attend this group.

## 2014-02-21 NOTE — [facility] Group Notes (Signed)
[place] LCSW Group Therapy  02/21/2014 1:15 pm  Type of Therapy: Process Group Therapy  Participation Level: Did not attend  Summary of Progress/Problems: Today's group addressed the issue of overcoming obstacles.  Patients were asked to identify their biggest obstacle post d/c that stands in the way of their on-going success, and then problem solve as to how to manage this.  Maureen Swanson 02/21/2014   12:28 PM

## 2014-02-21 NOTE — Progress Notes (Signed)
Nursing 1:1 note D:Pt observed sleeping in bed with eyes closed. RR even and unlabored. No distress noted. A: 1:1 observation continues for safety  R: pt remains safe  

## 2014-02-21 NOTE — Progress Notes (Signed)
RN 1:1 Note  D: Patient denies HI and auditory hallucinations; patient admits to seeing elephants and stars this morning; patient admits to [facility] but contracts  A: Monitored q 15 minutes; patient encouraged to attend groups; patient educated about medications; patient given medications per physician orders; patient encouraged to express feelings and/or concerns  R: Patient this morning is sleepy because she did not rest much the night before after medications was given; patient forwards little information this morning and denying pain; patient is taking all medications at this time

## 2014-02-22 DIAGNOSIS — R45850 Homicidal ideations: Secondary | ICD-10-CM

## 2014-02-22 LAB — GLUCOSE, CAPILLARY
GLUCOSE-CAPILLARY: 132 mg/dL — AB (ref 70–99)
Glucose-Capillary: 110 mg/dL — ABNORMAL HIGH (ref 70–99)

## 2014-02-22 MED ORDER — OXCARBAZEPINE 300 MG PO TABS
600.0000 mg | ORAL_TABLET | Freq: Two times a day (BID) | ORAL | Status: DC
  Administered 2014-02-22 – 2014-03-02 (×16): 600 mg via ORAL
  Filled 2014-02-22 (×2): qty 56
  Filled 2014-02-22 (×8): qty 2
  Filled 2014-02-22: qty 56
  Filled 2014-02-22 (×4): qty 2
  Filled 2014-02-22: qty 56
  Filled 2014-02-22 (×6): qty 2

## 2014-02-22 MED ORDER — DIPHENHYDRAMINE HCL 25 MG PO CAPS: 50.0000 mg | ORAL_CAPSULE | Freq: Four times a day (QID) | ORAL | Status: DC | PRN

## 2014-02-22 MED ORDER — OXCARBAZEPINE 150 MG PO TABS
400.0000 mg | ORAL_TABLET | Freq: Two times a day (BID) | ORAL | Status: DC
  Filled 2014-02-22 (×2): qty 2.5

## 2014-02-22 NOTE — Progress Notes (Signed)
[facility] MD Progress Note  02/22/2014 10:55 AM Maureen Swanson  MRN:  [id number] Subjective:    Patient states she slept well last night ,but continues to suicidal. Objective:  48 year old female admitted to due to suicidal ideations and command hallucinations. Agitated upon admission, with behaviors such as punching walls.  At this time she is on a 1:1 obs level for safety.  She states she is still hearing voices telling her to hurt " myself and people". She also reports SI with a plan to shoot self or cut self and reports she "cannot contract for safety" . She continues to report racing thoughts as well as anxiety issues. She denies medication side effects. Limited milieu/group participation.   Diagnosis:  DSM 5  Primary psychiatric diagnosis Schizoaffective disorder,bipolar type ,multiple episodes ,currently in acute episode  Secondary psychiatric diagnosis: Alcohol use disorder Alcohol withdrawal Cocaine use disorder  Non psychiatric diagnosis; GERD (gastroesophageal reflux disease)  Migraines  TMJ (temporomandibular joint disorder)  Asthma  Arthritis   Gastric ulcer per hx Atypical ductal hyperplasia of breast (03/2012) Hypertension Diabetes mellitus  diet-controlled    Total Time spent with patient: 25 minutes   ADL's:  fair  Sleep: good  Appetite:  "OK"  Suicidal Ideation:  Currently describes SI with plan to cut or shoot self ,cannot contract for safety Homicidal Ideation:  Yes towards her boyfriend and friend ,no plan. AEB (as evidenced by):  Psychiatric Specialty Exam: Physical Exam  Review of Systems  Constitutional: Negative for fever and chills.  Respiratory: Negative for cough and shortness of breath.   Psychiatric/Behavioral: Positive for depression, suicidal ideas, hallucinations and substance abuse.    Blood pressure 112/82, pulse 88, temperature 98.7 F (37.1 C), temperature source Oral, resp. rate 18, height 5\' 4"  (1.626 m), weight 91.627 kg (202  lb), SpO2 99.00%.Body mass index is 34.66 kg/(m^2).  General Appearance: Fairly Groomed  Engineer, water::  Fair  Speech:  Slow  Volume:  Normal  Mood:  Depressed  Affect:  Blunt  Thought Process:  slowed, does not appear particularly disorganized or loose  Orientation:  Full (Time, Place, and Person)  Thought Content:  Hallucinations: Auditory Command:  states voices tell her to hurt herself and other people  Suicidal Thoughts:  Yes.  with intent/plan will cut self or shoot self" I cannot contract for safety".  Homicidal Thoughts:  Yes.  without intent/plan boyfriend and friend  Memory:  Immediate;   Fair Recent;   Fair Remote;   Fair  Judgement:  Impaired  Insight:  Lacking  Psychomotor Activity:  Decreased  Concentration:  Fair  Recall:  Good  Fund of Knowledge:Good  Language: Fair  Akathisia:  Negative  Handed:  Right  AIMS (if indicated):     Assets:  Desire for Improvement Resilience  Sleep:  Number of Hours: 6   Musculoskeletal: Strength & Muscle Tone: within normal limits Gait & Station: normal Patient leans: N/A  Current Medications: Current Facility-Administered Medications  Medication Dose Route Frequency Provider Last Rate Last Dose  . acetaminophen (TYLENOL) tablet 650 mg  650 mg Oral Q6H PRN Dara Hoyer, PA-C   650 mg at 02/22/14 0617  . albuterol (PROVENTIL HFA;VENTOLIN HFA) 108 (90 BASE) MCG/ACT inhaler 2 puff  2 puff Inhalation Q6H PRN Dara Hoyer, PA-C      . alum & mag hydroxide-simeth (MAALOX/MYLANTA) 200-200-20 MG/5ML suspension 30 mL  30 mL Oral Q4H PRN Dara Hoyer, PA-C      . atenolol (TENORMIN)  tablet 100 mg  100 mg Oral Daily Dara Hoyer, PA-C   100 mg at 02/22/14 2876  . benztropine (COGENTIN) tablet 1 mg  1 mg Oral BID Dara Hoyer, PA-C   1 mg at 02/22/14 8115  . ciprofloxacin (CIPRO) tablet 500 mg  500 mg Oral BID Dara Hoyer, PA-C   500 mg at 02/22/14 7262  . LORazepam (ATIVAN) tablet 1 mg  1 mg Oral Q6H PRN Benjamine Mola,  FNP   1 mg at 02/21/14 0355   And  . haloperidol (HALDOL) tablet 5 mg  5 mg Oral Q6H PRN Benjamine Mola, FNP   5 mg at 02/21/14 9741  . hydrALAZINE (APRESOLINE) tablet 25 mg  25 mg Oral TID Dara Hoyer, PA-C   25 mg at 02/22/14 6384  . hydrochlorothiazide (HYDRODIURIL) tablet 25 mg  25 mg Oral Daily Dara Hoyer, PA-C   25 mg at 02/22/14 5364  . hydrOXYzine (ATARAX/VISTARIL) tablet 25 mg  25 mg Oral Q6H PRN Dara Hoyer, PA-C   25 mg at 02/20/14 1009  . lamoTRIgine (LAMICTAL) tablet 150 mg  150 mg Oral Daily Dara Hoyer, PA-C   150 mg at 02/22/14 6803  . loratadine (CLARITIN) tablet 10 mg  10 mg Oral Daily Dara Hoyer, PA-C   10 mg at 02/22/14 2122  . magnesium hydroxide (MILK OF MAGNESIA) suspension 30 mL  30 mL Oral Daily PRN Dara Hoyer, PA-C      . naproxen (NAPROSYN) tablet 500 mg  500 mg Oral BID WC Awab Abebe, MD   500 mg at 02/22/14 0837  . nicotine (NICODERM CQ - dosed in mg/24 hours) patch 21 mg  21 mg Transdermal Q0600 Dara Hoyer, PA-C   21 mg at 02/22/14 4825  . OXcarbazepine (TRILEPTAL) tablet 375 mg  375 mg Oral BID [name], MD      . pantoprazole (PROTONIX) EC tablet 40 mg  40 mg Oral QHS Dara Hoyer, PA-C   40 mg at 02/21/14 2150  . QUEtiapine (SEROQUEL XR) 24 hr tablet 600 mg  600 mg Oral QHS [name], MD   600 mg at 02/21/14 2150  . risperiDONE (RISPERDAL M-TABS) disintegrating tablet 2 mg  2 mg Oral QHS Navia Lindahl, MD      . thiamine (VITAMIN B-1) tablet 100 mg  100 mg Oral Daily Dara Hoyer, PA-C   100 mg at 02/22/14 0037  . traZODone (DESYREL) tablet 50 mg  50 mg Oral QHS PRN Dara Hoyer, PA-C   50 mg at 02/21/14 0240    Lab Results:  Results for orders placed during the hospital encounter of 02/17/14 (from the past 48 hour(s))  GLUCOSE, CAPILLARY     Status: Abnormal   Collection Time    02/20/14 11:57 AM      Result Value Ref Range   Glucose-Capillary 123 (*) 70 - 99 mg/dL   Comment 1 Documented in Chart      Comment 2 Notify RN    GLUCOSE, CAPILLARY     Status: Abnormal   Collection Time    02/20/14  4:56 PM      Result Value Ref Range   Glucose-Capillary 128 (*) 70 - 99 mg/dL  GLUCOSE, CAPILLARY     Status: Abnormal   Collection Time    02/20/14  9:07 PM      Result Value Ref Range   Glucose-Capillary 156 (*) 70 - 99 mg/dL  Comment 1 Notify RN    GLUCOSE, CAPILLARY     Status: Abnormal   Collection Time    02/21/14  7:39 AM      Result Value Ref Range   Glucose-Capillary 100 (*) 70 - 99 mg/dL   Comment 1 Notify RN    GLUCOSE, CAPILLARY     Status: Abnormal   Collection Time    02/21/14 11:12 AM      Result Value Ref Range   Glucose-Capillary 148 (*) 70 - 99 mg/dL   Comment 1 Notify RN    GLUCOSE, CAPILLARY     Status: Abnormal   Collection Time    02/21/14  4:56 PM      Result Value Ref Range   Glucose-Capillary 130 (*) 70 - 99 mg/dL  GLUCOSE, CAPILLARY     Status: Abnormal   Collection Time    02/21/14  9:06 PM      Result Value Ref Range   Glucose-Capillary 135 (*) 70 - 99 mg/dL  GLUCOSE, CAPILLARY     Status: Abnormal   Collection Time    02/22/14  6:09 AM      Result Value Ref Range   Glucose-Capillary 110 (*) 70 - 99 mg/dL    Physical Findings: AIMS: Facial and Oral Movements Muscles of Facial Expression: None, normal Lips and Perioral Area: None, normal Jaw: None, normal Tongue: None, normal,Extremity Movements Upper (arms, wrists, hands, fingers): None, normal Lower (legs, knees, ankles, toes): None, normal, Trunk Movements Neck, shoulders, hips: None, normal, Overall Severity Severity of abnormal movements (highest score from questions above): None, normal Incapacitation due to abnormal movements: None, normal Patient's awareness of abnormal movements (rate only patient's report): No Awareness, Dental Status Current problems with teeth and/or dentures?: No Does patient usually wear dentures?: No  CIWA:  CIWA-Ar Total: 0 COWS:  COWS Total Score:  1  Assessment:  Patient continues to be irritable ,hallucinating ,intrusive and paranoid ,reports having a plan to cut self or shoot self and reports not wanting to contract for safety. Patient today was more cooperative ,willing to talk and reported sleep as improved.    Treatment Plan Summary: Daily contact with patient to assess and evaluate symptoms and progress in treatment Medication management See below  Plan: Continue inpatient treatment. Continue Lamictal 150 mg QDAY. Could titrate dose higher. Continue CIWA protocol. Continue  Seroquel XR  600 mg po qhs. Reduce Risperidone to 2 mg po qhs. Could DC once stable on seroquel. Increase Trileptal 600 mg BID. Continue one to one observation level at this time. Continue HCTZ 25 mg po daily ,Hydralazine 25 mg po tid for HTN. Continue Hydroxyzine prn for anxiety. CSW will work on disposition.   Medical Decision Making Problem Points:  Established problem, stable/improving (1), Review of last therapy session (1) and Review of psycho-social stressors (1) Data Points:  Review or order clinical lab tests (1) Review of medication regiment & side effects (2) Review of new medications or change in dosage (2)  I certify that inpatient services furnished can reasonably be expected to improve the patient's condition.   Maureen Swanson 02/22/2014, 10:55 AM

## 2014-02-22 NOTE — Progress Notes (Signed)
Nursing 1:1 note D:Pt observed sleeping in bed with eyes closed. RR even and unlabored. No distress noted  A: 1:1 observation continues for safety  R: pt remains safe   Talked to pt earlier, she stated she was feeling a little better. Pt got up at 2015 went to group and went back to sleep after group. Pt denied SI/HI/AVH.

## 2014-02-22 NOTE — BHH Group Notes (Signed)
[facility] LCSW Aftercare Discharge Planning Group Note   02/22/2014 10:59 AM  Participation Quality:  Did not attend    Maureen Swanson

## 2014-02-22 NOTE — Progress Notes (Signed)
Did not attended group 

## 2014-02-22 NOTE — Progress Notes (Signed)
RN 1:1 Note  D: Patient is resting with eyes closed; patient respirations are even and unlabored  A: Continue 1:1 observation  R: Patient has no acute needs at this time;

## 2014-02-22 NOTE — Progress Notes (Addendum)
D: Pt presents flat in affect and depressed and anxious in mood. Pt is positive for SI/AVH. Pt reports that she is seeing people and elephants. She is having command hallucinations to harm herself. She reports that she cannot contract for safety at this time. " I will let you know when I can".  Pt did not actively participate within the milieu this evening.  A: 1:1 observation continues for this pt. Continued support and availability as needed was extended to this pt.  R: Pt remains safe at this time.

## 2014-02-22 NOTE — Progress Notes (Signed)
RN 1:1 NOTE  D: Patient reports " the little men are making me do this" and also states " I just want to sleep but I have been sleeping more"; patient was using her fist to hit the chair in the room  A: Continue 1:1 observation; medication to help calm   R: Patient was able to be redirected with 1:1 sitter and is sitting still in the room at this time

## 2014-02-22 NOTE — BHH Group Notes (Signed)
[facility] LCSW Group Therapy  02/22/2014 2:12 PM  Type of Therapy:  Group Therapy  Participation Level:  Active  Participation Quality:  Attentive  Affect:  Appropriate  Cognitive:  Oriented  Insight:  Limited  Engagement in Therapy:  Improving  Modes of Intervention:  Discussion, Education, Exploration, Rapport Building, Socialization and Support  Summary of Progress/Problems: MHA Speaker came to talk about his personal journey with substance abuse and addiction. Janye processed ways by which to relate to the speaker. [place] speaker provided handouts and educational information pertaining to groups and services offered by the [facility]. She did not ask questions, but was able to remain attentive and actively listen throughout group.    Smart, Naif Alabi LCSWA  02/22/2014, 2:12 PM

## 2014-02-22 NOTE — Progress Notes (Signed)
RN 1:1 Note  D: Patient denies reports hearing voices telling her to hurt herself and that she is worthless and seeing elephants floating around; patient reports that she did get some rest last night; patient reports thoughts of SI and right now she thinks that she would tell a staff member  A: Continue 1:1 observation  R: Patient does appear to be responding to internal stimuli; patient is cooperative but irritable at times; patient was up eating her breakfast and fixing up her area

## 2014-02-22 NOTE — Plan of Care (Signed)
Problem: Ineffective individual coping Goal: LTG: Patient will report a decrease in negative feelings Outcome: Progressing Pt stated she felt ok today

## 2014-02-22 NOTE — Progress Notes (Signed)
RN 1:1 Note 2000 D: Pt in bed resting with eyes closed. Respirations even and unlabored. Pt appears to be in no signs of distress at this time. A: 1:1 observation remains for this pt. R: Pt remains safe at this time.

## 2014-02-23 LAB — GLUCOSE, CAPILLARY
Glucose-Capillary: 136 mg/dL — ABNORMAL HIGH (ref 70–99)
Glucose-Capillary: 179 mg/dL — ABNORMAL HIGH (ref 70–99)
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
## 2014-02-23 NOTE — Progress Notes (Signed)
[place] [facility] 1:1 Observation Documentation  For the first (8) hours following discontinuation of 1:1 precautions, a progress note entry by nursing staff should be documented at least every 2 hours, reflecting the patient's behavior, condition, mood, and conversation.  Use the progress notes for additional entries.  Time 1:1 discontinued:  Pt was taken off her 1:1 at 9am per MD. Pt presently is in the dayroom with the other pts.   Patient's Behavior:  Calm and cooperative  Patient's Condition:  stable  Patient's Conversation:  Pt denies feeling SI or HI  Marcello Moores [facility] 02/23/2014, 9:15 AM

## 2014-02-23 NOTE — BHH Group Notes (Signed)
[place] Group Notes:  (Nursing/MHT/Case Management/Adjunct)  Date:  02/23/2014  Time:  10:09 AM  Type of Therapy:  Nurse Education  Participation Level:  None  Participation Quality:  Inattentive  Affect:  Flat  Cognitive:  Alert  Insight:  None  Engagement in Group:  Resistant  Modes of Intervention:  Discussion and Education  Summary of Progress/Problems: Writer spoke to the theme them of the day, which is leisure and lifestyle activity. Focusing primarily on medication compliance, nutrition, and better ways for patients to take better care of themselves.  Krish Bailly E 02/23/2014, 10:09 AM

## 2014-02-23 NOTE — Progress Notes (Signed)
Nursing 1:1 note D:Pt observed sleeping in bed with eyes closed. RR even and unlabored. No distress noted. A: 1:1 observation continues for safety  R: pt remains safe  

## 2014-02-23 NOTE — Progress Notes (Addendum)
[facility] MD Progress Note  02/23/2014 11:56 AM Maureen Swanson  MRN:  [id number] Subjective:    Patient states " I feel OK ". Objective:  48 year old female admitted to due to suicidal ideations and command hallucinations. Agitated upon admission, with behaviors such as punching walls. But patient has been improving ever since her admission. She was initially very irritable ,not cooperative with evaluation and not wanting to communicate . But over the course of her stay she has become more cooperative. She was very pleasant this AM , reported reduction in her hallucination and reported the voices as reduced.  She also reports her mood as more stable but with periods of irritability. She does have good insight in to her substance abuse problems and is able to relate her symptoms to her drug abuse and is motivated to get help with that.  She denies medication side effects.   Per staff she is improving and is more organized . She is compliant on medications.   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 Her mood appears anxious. Her affect is angry and labile. She is aggressive (improving) and actively hallucinating (improving). Cognition and memory are normal. She expresses impulsivity. She expresses suicidal (passive) ideation.    Review of Systems   Constitutional: Negative for fever and chills.  Respiratory: Negative for cough and shortness of breath.   Psychiatric/Behavioral: Positive for depression, suicidal ideas (passive), hallucinations (reduced) and substance abuse.    Blood pressure 116/75, pulse 72, temperature 97.7 F (36.5 C), temperature source Oral, resp. rate 18, height 5\' 4"  (1.626 m), weight 91.627 kg (202 lb), SpO2 99.00%.Body mass index is 34.66 kg/(m^2).  General Appearance: Fairly Groomed  Engineer, water::  Fair  Speech:  Slow  Volume:  Normal  Mood:  Depressed  Affect:  Blunt  Thought Process:  logical  Orientation:  Full (Time, Place, and Person)  Thought Content:  Hallucinations: Auditory Command:  states voices tell her to hurt herself and other people  Suicidal Thoughts:  Yes.  without intent/plan contracts for safety  Homicidal Thoughts:  No   Memory:  Immediate;   Fair Recent;   Fair Remote;   Fair  Judgement:  Impaired  Insight:  Lacking  Psychomotor Activity:  Decreased  Concentration:  Fair  Recall:  Good  Fund of Knowledge:Good  Language: Fair  Akathisia:  Negative  Handed:  Right  AIMS (if indicated):     Assets:  Desire for Improvement Resilience  Sleep:  Number of Hours: 6   Musculoskeletal: Strength & Muscle Tone: within normal limits Gait & Station: normal Patient leans: N/A  Current Medications: Current Facility-Administered Medications  Medication Dose Route Frequency Provider Last Rate Last Dose  . acetaminophen (TYLENOL) tablet 650 mg  650 mg Oral Q6H PRN Dara Hoyer, PA-C   650 mg at 02/22/14 0617  . albuterol (PROVENTIL HFA;VENTOLIN HFA) 108 (90 BASE) MCG/ACT inhaler 2 puff  2  puff Inhalation Q6H PRN Dara Hoyer, PA-C      . alum & mag hydroxide-simeth (MAALOX/MYLANTA) 200-200-20 MG/5ML suspension 30 mL  30 mL Oral Q4H PRN Dara Hoyer, PA-C      . atenolol (TENORMIN) tablet 100 mg  100 mg Oral Daily Dara Hoyer, PA-C   100 mg at 02/23/14 2458  . benztropine  (COGENTIN) tablet 1 mg  1 mg Oral BID Dara Hoyer, PA-C   1 mg at 02/23/14 0998  . ciprofloxacin (CIPRO) tablet 500 mg  500 mg Oral BID Dara Hoyer, PA-C   500 mg at 02/23/14 0827  . diphenhydrAMINE (BENADRYL) capsule 50 mg  50 mg Oral Q6H PRN [name], MD      . LORazepam (ATIVAN) tablet 1 mg  1 mg Oral Q6H PRN Benjamine Mola, FNP   1 mg at 02/23/14 1059   And  . haloperidol (HALDOL) tablet 5 mg  5 mg Oral Q6H PRN Benjamine Mola, FNP   5 mg at 02/23/14 1058  . hydrALAZINE (APRESOLINE) tablet 25 mg  25 mg Oral TID Dara Hoyer, PA-C   25 mg at 02/23/14 3382  . hydrochlorothiazide (HYDRODIURIL) tablet 25 mg  25 mg Oral Daily Dara Hoyer, PA-C   25 mg at 02/23/14 0827  . hydrOXYzine (ATARAX/VISTARIL) tablet 25 mg  25 mg Oral Q6H PRN Dara Hoyer, PA-C   25 mg at 02/22/14 2036  . lamoTRIgine (LAMICTAL) tablet 150 mg  150 mg Oral Daily Dara Hoyer, PA-C   150 mg at 02/23/14 0827  . loratadine (CLARITIN) tablet 10 mg  10 mg Oral Daily Dara Hoyer, PA-C   10 mg at 02/23/14 5053  . magnesium hydroxide (MILK OF MAGNESIA) suspension 30 mL  30 mL Oral Daily PRN Dara Hoyer, PA-C      . naproxen (NAPROSYN) tablet 500 mg  500 mg Oral BID WC [name], MD   500 mg at 02/23/14 0828  . nicotine (NICODERM CQ - dosed in mg/24 hours) patch 21 mg  21 mg Transdermal Q0600 Dara Hoyer, PA-C   21 mg at 02/23/14 0646  . Oxcarbazepine (TRILEPTAL) tablet 600 mg  600 mg Oral BID [name], MD   600 mg at 02/23/14 0827  . pantoprazole (PROTONIX) EC tablet 40 mg  40 mg Oral QHS Dara Hoyer, PA-C   40 mg at 02/22/14 2153  . QUEtiapine (SEROQUEL XR) 24 hr tablet 600 mg  600 mg Oral QHS [name], MD   600 mg at 02/22/14 2152  . risperiDONE (RISPERDAL M-TABS) disintegrating tablet 2 mg  2 mg Oral QHS [name], MD   2 mg at 02/22/14 2152  . thiamine (VITAMIN B-1) tablet 100 mg  100 mg Oral Daily Dara Hoyer, PA-C   100 mg at 02/23/14 0827  . traZODone (DESYREL)  tablet 50 mg  50 mg Oral QHS PRN Dara Hoyer, PA-C   50 mg at 02/21/14 0240    Lab Results:  Results for orders placed during the hospital encounter of 02/17/14 (from the past 48 hour(s))  GLUCOSE, CAPILLARY     Status: Abnormal   Collection Time    02/21/14  4:56 PM      Result Value Ref Range   Glucose-Capillary 130 (*) 70 - 99 mg/dL  GLUCOSE, CAPILLARY     Status: Abnormal   Collection Time    02/21/14  9:06 PM      Result  Value Ref Range   Glucose-Capillary 135 (*) 70 - 99 mg/dL  GLUCOSE, CAPILLARY     Status: Abnormal   Collection Time    02/22/14  6:09 AM      Result Value Ref Range   Glucose-Capillary 110 (*) 70 - 99 mg/dL  GLUCOSE, CAPILLARY     Status: Abnormal   Collection Time    02/22/14  5:15 PM      Result Value Ref Range   Glucose-Capillary 132 (*) 70 - 99 mg/dL  GLUCOSE, CAPILLARY     Status: Abnormal   Collection Time    02/23/14  6:06 AM      Result Value Ref Range   Glucose-Capillary 136 (*) 70 - 99 mg/dL    Physical Findings: AIMS: Facial and Oral Movements Muscles of Facial Expression: None, normal Lips and Perioral Area: None, normal Jaw: None, normal Tongue: None, normal,Extremity Movements Upper (arms, wrists, hands, fingers): None, normal Lower (legs, knees, ankles, toes): None, normal, Trunk Movements Neck, shoulders, hips: None, normal, Overall Severity Severity of abnormal movements (highest score from questions above): None, normal Incapacitation due to abnormal movements: None, normal Patient's awareness of abnormal movements (rate only patient's report): No Awareness, Dental Status Current problems with teeth and/or dentures?: No Does patient usually wear dentures?: No  CIWA:  CIWA-Ar Total: 2 COWS:  COWS Total Score: 1  Assessment:Patient is more cooperative and improving. Will continue current treatment plan.      Treatment Plan Summary: Daily contact with patient to assess and evaluate symptoms and progress in  treatment Medication management See below  Plan: Continue inpatient treatment. Continue Lamictal 150 mg QDAY. Could titrate dose higher. Continue CIWA protocol. Continue  Seroquel XR  600 mg po qhs. Reduce Risperidone to 2 mg po qhs. Could DC once stable on seroquel. Continue Trileptal 600 mg BID Continue HCTZ 25 mg po daily ,Hydralazine 25 mg po tid for HTN. Continue Hydroxyzine prn for anxiety. CSW will work on disposition.   Medical Decision Making Problem Points:  Established problem, stable/improving (1), Review of last therapy session (1) and Review of psycho-social stressors (1) Data Points:  Review or order clinical lab tests (1) Review of medication regiment & side effects (2) Review of new medications or change in dosage (2)  I certify that inpatient services furnished can reasonably be expected to improve the patient's condition.   Kera Deacon 02/23/2014, 11:56 AM

## 2014-02-23 NOTE — Progress Notes (Signed)
D  1:1  Pt. Presently denies SI and HI, but reports she was having the thoughts earlier this  Morning.Maureen Swanson offered support and encouragement,  Discussed A and VH with pt.    R Pt. Denies any command voices at present but again reports she was having them in the early am.  Pt. Denies any pain or discomfort at this time.  Pt. Remains safe on the unit.  Will continue the 1:1 for pt. Safety.

## 2014-02-23 NOTE — BHH Group Notes (Signed)
[facility] LCSW Group Therapy  02/23/2014 2:43 PM   Type of Therapy:  Group Therapy  Participation Level:  Active  Participation Quality:  Attentive  Affect:  Appropriate  Cognitive:  Appropriate  Insight:  Improving  Engagement in Therapy:  Engaged  Modes of Intervention:  Clarification, Education, Exploration and Socialization  Summary of Progress/Problems: Today's group focused on relapse prevention.  We defined the term, and then brainstormed on ways to prevent relapse.  Maureen Swanson's involvement in group was minimal as she appeared to be tired and had her eyes closed most of the time.  She continues to be on a 1:1.  She defined relapse as "starting back over again."   She also admitted that coming back to the hospital she wonders if "they will be able to help me."  Interesting identification of what she expects from being here.  Maureen Swanson 02/23/2014 , 2:43 PM
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
## 2014-02-23 NOTE — Progress Notes (Signed)
Pts 1:1 was discontinued today at 9am. Pt appears calm and presently is in the dayroom with the other pts. She denies SI or HI and contracts for safety. Pt appears tired this am and does not appear easily agitated this am. Pt would like to go to Rehab and social workers are aware.

## 2014-02-23 NOTE — Tx Team (Signed)
  Interdisciplinary Treatment Plan Update   Date Reviewed:  02/23/2014  Time Reviewed:  10:39 AM  Progress in Treatment:   Attending groups: Yes Participating in groups: Yes Taking medication as prescribed: Yes  Tolerating medication: Yes Family/Significant other contact made: Yes  Patient understands diagnosis: Yes  Discussing patient identified problems/goals with staff: Yes  See initial care plan Medical problems stabilized or resolved: Yes Denies suicidal/homicidal ideation: No  Does not contract for safety Was briefly off of 1:1 earlier today.  Back on again. Patient has not harmed self or others: Yes  For review of initial/current patient goals, please see plan of care.  Estimated Length of Stay:  4-5 days  Reason for Continuation of Hospitalization: Depression Hallucinations Medication stabilization Suicidal ideation  New Problems/Goals identified:  N/A  Discharge Plan or Barriers:   hopes to get into rehab  Additional Comments:  48 year old female admitted to due to suicidal ideations and command hallucinations. Agitated upon admission, with behaviors such as punching walls. But patient has been improving ever since her admission. She was initially very irritable ,not cooperative with evaluation and not wanting to communicate . But over the course of her stay she has become more cooperative. She was very pleasant this AM , reported reduction in her hallucination and reported the voices as reduced.  She also reports her mood as more stable but with periods of irritability. She does have good insight in to her substance abuse problems and is able to relate her symptoms to her drug abuse and is motivated to get help with that.   Attendees:  Signature: Steva Colder, MD 02/23/2014 10:39 AM   Signature: Ripley Fraise, LCSW 02/23/2014 10:39 AM  Signature: Elmarie Shiley, NP 02/23/2014 10:39 AM  Signature: Mayra Neer, RN 02/23/2014 10:39 AM  Signature: Darrol Angel, RN 02/23/2014  10:39 AM  Signature:  02/23/2014 10:39 AM  Signature:   02/23/2014 10:39 AM  Signature:    Signature:    Signature:    Signature:    Signature:    Signature:      Scribe for Treatment Team:   Ripley Fraise, LCSW  02/23/2014 10:39 AM

## 2014-02-24 LAB — GLUCOSE, CAPILLARY
GLUCOSE-CAPILLARY: 130 mg/dL — AB (ref 70–99)
GLUCOSE-CAPILLARY: 157 mg/dL — AB (ref 70–99)

## 2014-02-24 MED ORDER — QUETIAPINE FUMARATE ER 400 MG PO TB24
800.0000 mg | ORAL_TABLET | Freq: Every day | ORAL | Status: DC
  Administered 2014-02-24 – 2014-03-01 (×6): 800 mg via ORAL
  Filled 2014-02-24: qty 28
  Filled 2014-02-24 (×5): qty 2
  Filled 2014-02-24: qty 28
  Filled 2014-02-24 (×2): qty 2

## 2014-02-24 MED ORDER — LAMOTRIGINE 200 MG PO TABS
200.0000 mg | ORAL_TABLET | Freq: Every day | ORAL | Status: DC
  Administered 2014-02-25 – 2014-02-28 (×4): 200 mg via ORAL
  Filled 2014-02-24 (×6): qty 1

## 2014-02-24 NOTE — Progress Notes (Signed)
RN 1:1 Note  D: Patient denies HI and A/V hallucinations; patient reports itching; patient reports on and off thoughts of harming self and does not feel comfortable sitting alone  A: Monitored q 15 minutes; patient encouraged to attend groups; patient educated about medications; patient given medications per physician orders; patient encouraged to express feelings and/or concerns  R: Patient was found resting in the bed; patient was resting and patient was communicating but irritable

## 2014-02-24 NOTE — Progress Notes (Signed)
Nursing 1:1 note D:Pt observed sleeping in bed with eyes closed. RR even and unlabored. No distress noted. A: 1:1 observation continues for safety  R: pt remains safe  

## 2014-02-24 NOTE — BHH Group Notes (Signed)
[facility] LCSW Aftercare Discharge Planning Group Note   02/24/2014 11:18 AM  Participation Quality:  Minimal  Mood/Affect:  Depressed  Depression Rating:  8  Anxiety Rating:  8  Thoughts of Suicide:  Yes Will you contract for safety?   Yes  Current AVH:  Yes  Plan for Discharge/Comments:  Maureen Swanson is still on 1:1 due to SI.  She states she is hopeless, but does not know why.  When I asked about family, her affect brightened as she talked about granddaughter who is a kindergartner this year.  She wanted to know if we had heard back from [facility].  I updated her.  She is reluctant to call the other numbers we gave her because she does not want to go to a faith based work program.  [employer]: unk  Supports: family  [place], [place] B

## 2014-02-24 NOTE — Progress Notes (Signed)
[facility] MD Progress Note  02/24/2014 10:28 AM Maureen Swanson  MRN:  [id number] Subjective:    Patient states " I had a rough night last night" Objective:  48 year old female admitted  due to suicidal ideations and command hallucinations. Patient continues to be having mood lability. Reports AH asking her to "kill self" last night. Reports sleep as limited and reports anxiety. She denies any HI today,but continues to report not feeling safe and would like to be continued on 1:1. She does have good insight in to her substance abuse problems and is able to relate her symptoms to her drug abuse and is motivated to get help with that. She denies medication side effects.   Per staff she continues to endorse irritability periodically ,has AH as well as endorses SI . She denies any plan and is able to contract for safety.   Diagnosis:  DSM 5  Primary psychiatric diagnosis Schizoaffective disorder,bipolar type ,multiple episodes ,currently in acute episode  Secondary psychiatric diagnosis: Alcohol use disorder Alcohol withdrawal Cocaine use disorder  Non psychiatric diagnosis; GERD (gastroesophageal reflux disease)  Migraines  TMJ (temporomandibular joint disorder)  Asthma  Arthritis   Gastric ulcer per hx Atypical ductal hyperplasia of breast (03/2012) Hypertension Diabetes mellitus  diet-controlled    Total Time spent with patient: 25 minutes   ADL's:  fair  Sleep: poor  Appetite:  "OK"   AEB (as evidenced by):  Psychiatric Specialty Exam: Physical Exam  Psychiatric: Her speech is normal. Her mood appears anxious. Her affect is angry and labile. She is aggressive (improving) and actively hallucinating (worse last night). Cognition and memory are normal. She expresses impulsivity. She expresses suicidal (passive) ideation.    Review of Systems  Constitutional: Negative for fever and chills.  Respiratory: Negative for cough and shortness of breath.   Psychiatric/Behavioral:  Positive for depression, suicidal ideas (passive), hallucinations and substance abuse.    Blood pressure 121/66, pulse 83, temperature 98.1 F (36.7 C), temperature source Oral, resp. rate 17, height 5\' 4"  (1.626 m), weight 91.627 kg (202 lb), SpO2 99.00%.Body mass index is 34.66 kg/(m^2).  General Appearance: Fairly Groomed  Engineer, water::  Fair  Speech:  Slow  Volume:  Normal  Mood:  Anxious and Depressed  Affect:  Blunt  Thought Process:  logical  Orientation:  Full (Time, Place, and Person)  Thought Content:  Hallucinations: Auditory Command:  states voices tell her to hurt herself ,was worse last night  Suicidal Thoughts:  Yes.  without intent/plan contracts for safety  Homicidal Thoughts:  No   Memory:  Immediate;   Fair Recent;   Fair Remote;   Fair  Judgement:  Impaired  Insight:  Lacking  Psychomotor Activity:  Normal  Concentration:  Fair  Recall:  Good  Fund of Knowledge:Good  Language: Fair  Akathisia:  Negative  Handed:  Right  AIMS (if indicated):     Assets:  Desire for Improvement Resilience  Sleep:  Number of Hours: 6   Musculoskeletal: Strength & Muscle Tone: within normal limits Gait & Station: normal Patient leans: N/A  Current Medications: Current Facility-Administered Medications  Medication Dose Route Frequency Provider Last Rate Last Dose  . acetaminophen (TYLENOL) tablet 650 mg  650 mg Oral Q6H PRN Dara Hoyer, PA-C   650 mg at 02/22/14 0617  . albuterol (PROVENTIL HFA;VENTOLIN HFA) 108 (90 BASE) MCG/ACT inhaler 2 puff  2 puff Inhalation Q6H PRN Dara Hoyer, PA-C      . alum & Iris Pert  hydroxide-simeth (MAALOX/MYLANTA) 200-200-20 MG/5ML suspension 30 mL  30 mL Oral Q4H PRN Dara Hoyer, PA-C      . atenolol (TENORMIN) tablet 100 mg  100 mg Oral Daily Dara Hoyer, PA-C   100 mg at 02/24/14 0817  . benztropine (COGENTIN) tablet 1 mg  1 mg Oral BID Dara Hoyer, PA-C   1 mg at 02/24/14 0818  . ciprofloxacin (CIPRO) tablet 500 mg  500 mg  Oral BID Dara Hoyer, PA-C   500 mg at 02/24/14 8182  . diphenhydrAMINE (BENADRYL) capsule 50 mg  50 mg Oral Q6H PRN [name], MD      . LORazepam (ATIVAN) tablet 1 mg  1 mg Oral Q6H PRN Benjamine Mola, FNP   1 mg at 02/23/14 1059   And  . haloperidol (HALDOL) tablet 5 mg  5 mg Oral Q6H PRN Benjamine Mola, FNP   5 mg at 02/23/14 1058  . hydrALAZINE (APRESOLINE) tablet 25 mg  25 mg Oral TID Dara Hoyer, PA-C   25 mg at 02/24/14 0818  . hydrochlorothiazide (HYDRODIURIL) tablet 25 mg  25 mg Oral Daily Dara Hoyer, PA-C   25 mg at 02/24/14 9937  . hydrOXYzine (ATARAX/VISTARIL) tablet 25 mg  25 mg Oral Q6H PRN Dara Hoyer, PA-C   25 mg at 02/24/14 0818  . [START ON 02/25/2014] lamoTRIgine (LAMICTAL) tablet 200 mg  200 mg Oral Daily Chloie Loney, MD      . loratadine (CLARITIN) tablet 10 mg  10 mg Oral Daily Dara Hoyer, PA-C   10 mg at 02/24/14 0818  . magnesium hydroxide (MILK OF MAGNESIA) suspension 30 mL  30 mL Oral Daily PRN Dara Hoyer, PA-C      . nicotine (NICODERM CQ - dosed in mg/24 hours) patch 21 mg  21 mg Transdermal Q0600 Dara Hoyer, PA-C   21 mg at 02/24/14 1696  . Oxcarbazepine (TRILEPTAL) tablet 600 mg  600 mg Oral BID [name], MD   600 mg at 02/24/14 0818  . pantoprazole (PROTONIX) EC tablet 40 mg  40 mg Oral QHS Dara Hoyer, PA-C   40 mg at 02/23/14 2224  . QUEtiapine (SEROQUEL XR) 24 hr tablet 800 mg  800 mg Oral QHS Travanti Mcmanus, MD      . risperiDONE (RISPERDAL M-TABS) disintegrating tablet 2 mg  2 mg Oral QHS [name], MD   2 mg at 02/23/14 2224  . thiamine (VITAMIN B-1) tablet 100 mg  100 mg Oral Daily Dara Hoyer, PA-C   100 mg at 02/24/14 7893  . traZODone (DESYREL) tablet 50 mg  50 mg Oral QHS PRN Dara Hoyer, PA-C   50 mg at 02/21/14 0240    Lab Results:  Results for orders placed during the hospital encounter of 02/17/14 (from the past 48 hour(s))  GLUCOSE, CAPILLARY     Status: Abnormal   Collection Time     02/22/14  5:15 PM      Result Value Ref Range   Glucose-Capillary 132 (*) 70 - 99 mg/dL  GLUCOSE, CAPILLARY     Status: Abnormal   Collection Time    02/23/14  6:06 AM      Result Value Ref Range   Glucose-Capillary 136 (*) 70 - 99 mg/dL  GLUCOSE, CAPILLARY     Status: Abnormal   Collection Time    02/23/14  5:02 PM      Result Value Ref Range   Glucose-Capillary  179 (*) 70 - 99 mg/dL   Comment 1 Documented in Chart     Comment 2 Notify RN    GLUCOSE, CAPILLARY     Status: Abnormal   Collection Time    02/24/14  4:38 AM      Result Value Ref Range   Glucose-Capillary 130 (*) 70 - 99 mg/dL    Physical Findings: AIMS: Facial and Oral Movements Muscles of Facial Expression: None, normal Lips and Perioral Area: None, normal Jaw: None, normal Tongue: None, normal,Extremity Movements Upper (arms, wrists, hands, fingers): None, normal Lower (legs, knees, ankles, toes): None, normal, Trunk Movements Neck, shoulders, hips: None, normal, Overall Severity Severity of abnormal movements (highest score from questions above): None, normal Incapacitation due to abnormal movements: None, normal Patient's awareness of abnormal movements (rate only patient's report): No Awareness, Dental Status Current problems with teeth and/or dentures?: No Does patient usually wear dentures?: No  CIWA:  CIWA-Ar Total: 4 COWS:  COWS Total Score: 1       Treatment Plan Summary: Daily contact with patient to assess and evaluate symptoms and progress in treatment Medication management See below  Assessment and Plan: Continue inpatient treatment. Patient today reports worsening AH last night and having a rough night. Will readjust her medications. Patient will be continued on 1:1 today. Increase Lamictal 200  mg QDAY. Could titrate dose higher. Continue CIWA protocol. Incresae Seroquel XR to  800 mg po qhs. Reduce Risperidone to 2 mg po qhs. Could DC once stable on seroquel. Continue Trileptal 600  mg BID Continue HCTZ 25 mg po daily ,Hydralazine 25 mg po tid for HTN. Continue Hydroxyzine prn for anxiety. CSW will work on disposition.   Medical Decision Making Problem Points:  Established problem, stable/improving (1), Review of last therapy session (1) and Review of psycho-social stressors (1) Data Points:  Review or order clinical lab tests (1) Review of medication regiment & side effects (2) Review of new medications or change in dosage (2)  I certify that inpatient services furnished can reasonably be expected to improve the patient's condition.   Maureen Swanson 02/24/2014, 10:28 AM

## 2014-02-24 NOTE — Progress Notes (Signed)
[facility] Group Notes:  (Nursing/MHT/Case Management/Adjunct)  Date:  02/24/2014  Time:  8:51 PM  Type of Therapy:  Psychoeducational Skills  Participation Level:  Active  Participation Quality:  Appropriate  Affect:  Blunted  Cognitive:  Appropriate  Insight:  Good  Engagement in Group:  Engaged  Modes of Intervention:  Education  Summary of Progress/Problems: The patient expressed in group this evening that she had a "fairly good" day. The patient shared that she spent less time in bed today as compared to yesterday, but is now quite tired. She is also reporting fewer visual hallucinations today. She also mentioned that she was appreciative of the sitters that have been assigned to her today. As a theme for the day, her relapse prevention will involve attending a long-term treatment program.   Gennette Pac 02/24/2014, 8:51 PM

## 2014-02-24 NOTE — Progress Notes (Signed)
Nursing 1:1 note D:Pt observed in dayroom watching TV. RR even and unlabored. No distress noted.Pt used the bathroom at 1940  A: 1:1 observation continues for safety  R: pt remains safe

## 2014-02-24 NOTE — BHH Group Notes (Signed)
[facility] LCSW Group Therapy  02/24/2014  1:05 PM  Type of Therapy:  Group therapy  Participation Level:  Active  Participation Quality:  Attentive  Affect:  Flat  Cognitive:  Oriented  Insight:  Limited  Engagement in Therapy:  Limited  Modes of Intervention:  Discussion, Socialization  Summary of Progress/Problems:  Chaplain was here to lead a group on theme of care.  Maureen Swanson talked about her son and how he conveys caring by calling her, visiting with his daughter and bringing her presents.Talked not only about feeling that judgment by others is not caring, but gave example of a sister who "preaches."  Talked about her alcoholic partner who is demanding.  Stated that the best thing to do at that time is to go for a walk.    [name] 02/24/2014 1:40 PM

## 2014-02-24 NOTE — Progress Notes (Signed)
  D:Pt observed sleeping in bed with eyes closed. RR even and unlabored. No distress noted. A: 1:1 observation continues for safety  R: pt remains safe

## 2014-02-24 NOTE — Progress Notes (Signed)
RN 1:1 NOTE  D: Patient is in the bathroom and is brushing her teeth; patient reports that she is seeing the elephants and not hearing voices  A: Continue 1:1 observation  R: Patient is cooperative and has calm down and is participating

## 2014-02-24 NOTE — Progress Notes (Signed)
RN 1:1 NOTE  D: Patient is resting with eyes closed and respirations are even and unlabored  A: Continue 1:1 observation  R: Patient has no acute needs at this time

## 2014-02-24 NOTE — Plan of Care (Signed)
Problem: Alteration in mood & ability to function due to Goal: LTG-Pt reports reduction in suicidal thoughts (Patient reports reduction in suicidal thoughts and is able to verbalize a safety plan for whenever patient is feeling suicidal)  Outcome: Progressing Pt stated her SI-passive but not as bad, but still coming and going.  Goal: STG-Patient will attend groups Outcome: Progressing Pt stated she went to a few groups today, and also attended wrap-up group

## 2014-02-25 LAB — GLUCOSE, CAPILLARY
GLUCOSE-CAPILLARY: 205 mg/dL — AB (ref 70–99)
Glucose-Capillary: 180 mg/dL — ABNORMAL HIGH (ref 70–99)
Glucose-Capillary: 156 mg/dL — ABNORMAL HIGH (ref 70–99)

## 2014-02-25 NOTE — Progress Notes (Signed)
Pt stated ,"I'm not doing good today." "I keep hearing voices telling me to hurt myself." Pt remains on a 1:1 and remains safe. She was given vaseline for biting her lip. Pt did contract and stated while she had someone with her she would not hurt herself.

## 2014-02-25 NOTE — Progress Notes (Addendum)
[facility] Group Notes:  (Nursing/MHT/Case Management/Adjunct)  Date:  02/25/2014  Time:  9:48 AM  Type of Therapy:  Psychoeducational Skills & Self-Inventory Group  Participation Level:  Active  Participation Quality:  Appropriate and Attentive  Affect:  Depressed  Cognitive:  Alert and Appropriate  Insight:  Appropriate and Good  Engagement in Group:  Engaged and Supportive  Modes of Intervention:  Discussion and Education  Summary of Progress/Problems: Patient was able to list coping skills and how they would positively impact her in stressful situations.  Clinton Sawyer [facility] 02/25/2014, 9:48 AM

## 2014-02-25 NOTE — Progress Notes (Signed)
1:1 Note  Patient currently in room resting with MHT present. Patient has a depressed mood and affect. Patient attending groups and participating within the milieu. Will continue to monitor patient for safety.

## 2014-02-25 NOTE — BHH Group Notes (Signed)
[facility] Group Notes:  (Clinical Social Work)  02/25/2014  11:15-12:00PM  Summary of Progress/Problems:   The main focus of today's process group was to discuss patients' feelings about hospitalization, the stigma attached to mental health, and sources of motivation to stay well.  We then worked to identify a specific plan to avoid future hospitalizations when discharged from the hospital for this admission.  The patient expressed a lack of understanding how she feels about being in the hospital; however, she was able to talk about her habit of stopping medication because she starts to feel she does not need it.  Type of Therapy:  Group Therapy - Process  Participation Level:  Active  Participation Quality:  Attentive  Affect:  Flat  Cognitive:  Appropriate  Insight:  Developing/Improving  Engagement in Therapy:  Developing/Improving  Modes of Intervention:  Exploration, Discussion  Selmer Dominion, LCSW 02/25/2014, 1:07 PM

## 2014-02-25 NOTE — Progress Notes (Signed)
Nursing 1:1 note D:Pt observed sleeping in bed with eyes closed. RR even and unlabored. No distress noted. A: 1:1 observation continues for safety  R: pt remains safe  

## 2014-02-25 NOTE — Progress Notes (Signed)
D: Pt denies HI, Pt +ve AVH / SI-contracts. Pt is pleasant and cooperative. Pt stated she was fair today. Pt said she went to all her meetings and plans to go to [facility] on D/C, or shelter if no bed available. Pt appeared to be in a better mood.   A: Pt was offered support and encouragement. Pt was given scheduled medications. Pt was encourage to attend groups. Q 15 minute checks were done for safety.   R:Pt attends groups and interacts well with peers and staff. Pt is taking medication.Pt receptive to treatment and safety maintained on unit.

## 2014-02-25 NOTE — Progress Notes (Signed)
[facility] Group Notes:  (Nursing/MHT/Case Management/Adjunct)  Date:  02/25/2014  Time:  8:56 PM  Type of Therapy:  Psychoeducational Skills  Participation Level:  Active  Participation Quality:  Appropriate  Affect:  Flat  Cognitive:  Appropriate  Insight:  Good  Engagement in Group:  Engaged  Modes of Intervention:  Education  Summary of Progress/Problems: The patient mentioned that she had a visit her son today and was pleased since he is out of town quite a bit due to being a truckdriver. The patient mentioned that she was proud of herself since she made herself stay awake all day except for one nap. She also verbalized that she attended all of her groups. The patient was unable to come up with a coping skill as a theme for the day.   Archie Balboa S 02/25/2014, 8:56 PM

## 2014-02-25 NOTE — Progress Notes (Signed)
1:1 Note  Patient currently in dayroom with MHT present. Patient denies any needs or concerns at this time. Will continue to monitor patient for safety.

## 2014-02-25 NOTE — Progress Notes (Addendum)
Nursing 1:1 note D:Pt observed in dayroom playing cards with other patients. . RR even and unlabored. No distress noted.  A: 1:1 observation continues for safety  R: pt remains safe

## 2014-02-25 NOTE — Progress Notes (Signed)
Pt has been in her room. She told the nurse earlier that she could not promise she would not hurt herself. She remains a 1:1 and remains safe. Report to [facility]

## 2014-02-25 NOTE — Progress Notes (Signed)
Patient ID: Maureen Swanson, female   DOB: 09/02/1965, 48 y.o.   MRN: [id number] [facility] MD Progress Note  02/25/2014 11:52 AM Maureen Swanson  MRN:  [id number] Subjective:     Patient states "I'm feeling a little bit better. I slept better and I'm eating. I did hit the walls earlier when I got upset. Also, I keep seeing tigers here where I used to live".   Objective:  48 year old female admitted  due to suicidal ideations and command hallucinations. Patient continues to be having mood lability. Reports AH asking her to "kill self" last night. Pt also hears voices telling her to kill her boyfriend and she is not sure if she can prevent herself from following through with any of it.  Pt reports seeing tigers and feeling as if she is at home rather than [facility].    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 Her mood appears anxious. Her affect is angry and labile. She is aggressive (improving) and actively hallucinating (worse last night). Cognition and memory are normal. She expresses impulsivity. She expresses suicidal (passive) ideation.    Review of Systems  Constitutional: Negative for fever and chills.  Respiratory: Negative for cough and shortness of breath.   Psychiatric/Behavioral: Positive for depression, suicidal ideas (passive), hallucinations  and substance abuse.    Blood pressure 124/81, pulse 81, temperature 98.4 F (36.9 C), temperature source Oral, resp. rate 16, height 5\' 4"  (1.626 m), weight 91.627 kg (202 lb), SpO2 99.00%.Body mass index is 34.66 kg/(m^2).  General Appearance: Fairly Groomed  Engineer, water::  Fair  Speech:  Slow  Volume:  Normal  Mood:  Anxious and Depressed  Affect:  Blunt  Thought Process:  logical  Orientation:  Full (Time, Place, and Person)  Thought Content:  Hallucinations: Auditory Command:  states voices tell her to hurt herself ,was worse last night  Suicidal Thoughts:  Yes.  without intent/plan contracts for safety  Homicidal Thoughts:  No   Memory:  Immediate;   Fair Recent;   Fair Remote;   Fair  Judgement:  Impaired  Insight:  Lacking  Psychomotor Activity:  Normal  Concentration:  Fair  Recall:  Good  Fund of Knowledge:Good  Language: Fair  Akathisia:  Negative  Handed:  Right  AIMS (if indicated):     Assets:  Desire for Improvement Resilience  Sleep:  Number of Hours: 4.75   Musculoskeletal: Strength & Muscle Tone: within normal limits Gait & Station: normal Patient leans: N/A  Current Medications: Current Facility-Administered Medications  Medication Dose Route Frequency Provider Last Rate Last Dose  . acetaminophen (TYLENOL) tablet 650 mg  650 mg Oral Q6H PRN Dara Hoyer, PA-C   650 mg at 02/26/14 1308  . albuterol (PROVENTIL HFA;VENTOLIN HFA) 108 (90 BASE) MCG/ACT inhaler 2 puff  2 puff Inhalation Q6H PRN Dara Hoyer, PA-C      . alum & mag hydroxide-simeth (MAALOX/MYLANTA)  200-200-20 MG/5ML suspension 30 mL  30 mL Oral Q4H PRN Dara Hoyer, PA-C      . atenolol (TENORMIN) tablet 100 mg  100 mg Oral Daily Dara Hoyer, PA-C   100 mg at 02/26/14 0750  . benztropine (COGENTIN) tablet 1 mg  1 mg Oral BID Dara Hoyer, PA-C   1 mg at 02/26/14 0751  . ciprofloxacin (CIPRO) tablet 500 mg  500 mg Oral BID Dara Hoyer, PA-C   500 mg at 02/26/14 0751  .  diphenhydrAMINE (BENADRYL) capsule 50 mg  50 mg Oral Q6H PRN [name], MD      . LORazepam (ATIVAN) tablet 1 mg  1 mg Oral Q6H PRN Benjamine Mola, FNP   1 mg at 02/23/14 1059   And  . haloperidol (HALDOL) tablet 5 mg  5 mg Oral Q6H PRN Benjamine Mola, FNP   5 mg at 02/23/14 1058  . hydrALAZINE (APRESOLINE) tablet 25 mg  25 mg Oral TID Dara Hoyer, PA-C   25 mg at 02/26/14 0751  . hydrochlorothiazide (HYDRODIURIL) tablet 25 mg  25 mg Oral Daily Dara Hoyer, PA-C   25 mg at 02/26/14 0751  . hydrOXYzine (ATARAX/VISTARIL) tablet 25 mg  25 mg Oral Q6H PRN Dara Hoyer, PA-C   25 mg at 02/26/14 1352  . lamoTRIgine (LAMICTAL) tablet 200 mg  200 mg Oral Daily [name], MD   200 mg at 02/26/14 0750  . loratadine (CLARITIN) tablet 10 mg  10 mg Oral Daily Dara Hoyer, PA-C   10 mg at 02/26/14 4235  . magnesium hydroxide (MILK OF MAGNESIA) suspension 30 mL  30 mL Oral Daily PRN Dara Hoyer, PA-C      . nicotine (NICODERM CQ - dosed in mg/24 hours) patch 21 mg  21 mg Transdermal Q0600 Dara Hoyer, PA-C   21 mg at 02/25/14 0600  . Oxcarbazepine (TRILEPTAL) tablet 600 mg  600 mg Oral BID [name], MD   600 mg at 02/26/14 0752  . pantoprazole (PROTONIX) EC tablet 40 mg  40 mg Oral QHS Dara Hoyer, PA-C   40 mg at 02/25/14 2137  . QUEtiapine (SEROQUEL XR) 24 hr tablet 800 mg  800 mg Oral QHS [name], MD   800 mg at 02/25/14 2137  . risperiDONE (RISPERDAL M-TABS) disintegrating tablet 2 mg  2 mg Oral QHS [name], MD   2 mg at 02/25/14 2137  . thiamine (VITAMIN B-1) tablet 100 mg  100 mg Oral Daily Dara Hoyer, PA-C   100 mg at 02/26/14 3614  . traZODone (DESYREL) tablet 50 mg  50 mg Oral QHS PRN Dara Hoyer, PA-C   50 mg at 02/26/14 0107    Lab Results:  Results for orders placed during the hospital encounter of 02/17/14 (from the past 48 hour(s))  GLUCOSE, CAPILLARY     Status: Abnormal   Collection Time    02/23/14  5:02 PM      Result Value  Ref Range   Glucose-Capillary 179 (*) 70 - 99 mg/dL   Comment 1 Documented in Chart     Comment 2 Notify RN    GLUCOSE, CAPILLARY     Status: Abnormal   Collection Time    02/24/14  4:38 AM      Result Value Ref Range   Glucose-Capillary 130 (*) 70 - 99 mg/dL  GLUCOSE, CAPILLARY     Status: Abnormal   Collection Time  02/24/14  5:09 PM      Result Value Ref Range   Glucose-Capillary 157 (*) 70 - 99 mg/dL  GLUCOSE, CAPILLARY     Status: Abnormal   Collection Time    02/25/14  5:54 AM      Result Value Ref Range   Glucose-Capillary 180 (*) 70 - 99 mg/dL   Comment 1 Notify RN      Physical Findings: AIMS: Facial and Oral Movements Muscles of Facial Expression: None, normal Lips and Perioral Area: None, normal Jaw: None, normal Tongue: None, normal,Extremity Movements Upper (arms, wrists, hands, fingers): None, normal Lower (legs, knees, ankles, toes): None, normal, Trunk Movements Neck, shoulders, hips: None, normal, Overall Severity Severity of abnormal movements (highest score from questions above): None, normal Incapacitation due to abnormal movements: None, normal Patient's awareness of abnormal movements (rate only patient's report): No Awareness, Dental Status Current problems with teeth and/or dentures?: No Does patient usually wear dentures?: No  CIWA:  CIWA-Ar Total: 0 COWS:  COWS Total Score: 1       Treatment Plan Summary: Daily contact with patient to assess and evaluate symptoms and progress in treatment Medication management See below  Assessment and Plan: Continue inpatient treatment. Patient today reports worsening AH last night and having a rough night. Will readjust her medications. Patient will be continued on 1:1 today. Increase Lamictal 200  mg QDAY. Could titrate dose higher.  Continue CIWA protocol. Incresae Seroquel XR to  800 mg po qhs. Reduce Risperidone to 2 mg po qhs.  Continue Trileptal 600 mg BID Continue HCTZ 25 mg po daily  ,Hydralazine 25 mg po tid for HTN. Continue Hydroxyzine prn for anxiety. CSW will work on disposition.   Medical Decision Making Problem Points:  Established problem, stable/improving (1), Review of last therapy session (1) and Review of psycho-social stressors (1) Data Points:  Review or order clinical lab tests (1) Review of medication regiment & side effects (2) Review of new medications or change in dosage (2)  I certify that inpatient services furnished can reasonably be expected to improve the patient's condition.   Benjamine Mola, FNP-BC 02/25/2014, 11:52 AM I agreed with findings and treatment plan of this patient

## 2014-02-25 NOTE — Progress Notes (Signed)
D. Pt in dayroom at this time, sitting there talking with sitter, and recently finished dinner that was brought back for her. Pt has endorsed auditory hallucinations to hurt herself. Pt did receive medications this evening without incident. A. Support and encouragement provided. R. Safety maintained, will continue to monitor.

## 2014-02-26 DIAGNOSIS — F319 Bipolar disorder, unspecified: Secondary | ICD-10-CM

## 2014-02-26 LAB — GLUCOSE, CAPILLARY
GLUCOSE-CAPILLARY: 136 mg/dL — AB (ref 70–99)
Glucose-Capillary: 118 mg/dL — ABNORMAL HIGH (ref 70–99)
Glucose-Capillary: 219 mg/dL — ABNORMAL HIGH (ref 70–99)

## 2014-02-26 MED ORDER — HYDRALAZINE HCL 25 MG PO TABS
25.0000 mg | ORAL_TABLET | Freq: Two times a day (BID) | ORAL | Status: DC
  Administered 2014-02-27 – 2014-03-02 (×7): 25 mg via ORAL
  Filled 2014-02-26 (×11): qty 1

## 2014-02-26 MED ORDER — MAGNESIUM CITRATE PO SOLN
1.0000 | Freq: Once | ORAL | Status: AC
  Administered 2014-02-26: 1 via ORAL

## 2014-02-26 NOTE — Progress Notes (Addendum)
Pt continues to remain a 1:1 and contracts for safety. She admits she had SI thoughts last pm and no negative thoughts today. Pt presently is in the dayroom playing cards with the other pts. She appears in good spirits and her affect is brighter today. Pt did states she had a difficult time sleeping last pm.Pt rates her depression is a 8/10 and her hopelessness is a 8/10. Pt states she has chronic knee, and back pain. NP made aware.Pt has a low BP and feel lighheaded. NP made aware. 11:30am _awakened pt and gave pt a large cup of gatorade to drink -Will recheck BP-Pts hydralazine was held due to low BP.Pt requested visteral for her nerves and was given 25mg  of this. 2pm -pt remains on a 1:1 and is calm,.Denies SI and HI and contracts for safety.

## 2014-02-26 NOTE — [facility] Group Notes (Signed)
[place] Group Notes: self inventory  Date:  02/26/2014  Time: 0930  Type of Therapy:  Nurse Education  Participation Level:  Active  Participation Quality:  Appropriate  Affect:  Appropriate  Cognitive:  Alert  Insight:  Appropriate  Engagement in Group:  Engaged  Modes of Intervention:  Activity  Summary of Progress/Problems:  Maureen Swanson 02/26/2014, 10:19 AM

## 2014-02-26 NOTE — Progress Notes (Signed)
Adult Psychoeducational Group Note  Date:  02/26/2014 Time:  9:09 PM  Group Topic/Focus:  Wrap-Up Group:   The focus of this group is to help patients review their daily goal of treatment and discuss progress on daily workbooks.  Participation Level:  Active  Participation Quality:  Appropriate  Affect:  Appropriate  Cognitive:  Appropriate  Insight: Appropriate  Engagement in Group:  Engaged  Modes of Intervention:  Discussion  Additional Comments:  Pt attended wrap-up group this evening. Pt participate in group with peers.   Dekker Verga A 02/26/2014, 9:09 PM

## 2014-02-26 NOTE — Progress Notes (Signed)
1-1 note: D. Pt has been up and has been active in milieu this evening, attending and participating in various milieu activities. Pt did receive medications without incident, still endorsing auditory hallucinations this evening and did complain of back pain and requested and received PRN medication to assist with pain. A. Support and encouragement provided. R. Safety maintained, will continue to monitor.

## 2014-02-26 NOTE — Progress Notes (Signed)
Patient ID: Maureen Swanson, female   DOB: 01/26/1966, 48 y.o.   MRN: [id number] [facility] MD Progress Note  02/26/2014 2:15 PM Maureen Swanson  MRN:  [id number] Subjective:     Patient states "I'm feeling terrible today. The voices are awful and I know that I am seeing bunnies for sure at the place I used to live. It definitely was not a dream. I keep hearing the voices saying to kill myself and my bf. We don't get along at all".   Objective:  48 year old female admitted due to suicidal ideations and command hallucinations. Patient continues to be having mood lability. Reports AH asking her to "kill self" last night. Pt also hears voices telling her to kill her boyfriend and this is persisting.   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 Her mood appears anxious. Her affect is angry and labile. She is aggressive (improving) and actively hallucinating (worse last night). Cognition and memory are normal. She expresses impulsivity. She expresses suicidal (passive) ideation.    Review of Systems  Constitutional: Negative for fever and chills.  Respiratory: Negative for cough and shortness of breath.   Psychiatric/Behavioral: Positive for depression, suicidal ideas (passive), hallucinations and substance abuse.    Blood pressure 94/60, pulse 82,  temperature 98 F (36.7 C), temperature source Oral, resp. rate 16, height 5\' 4"  (1.626 m), weight 91.627 kg (202 lb), SpO2 99.00%.Body mass index is 34.66 kg/(m^2).  General Appearance: Fairly Groomed  Engineer, water::  Fair  Speech:  Slow  Volume:  Normal  Mood:  Anxious and Depressed  Affect:  Blunt  Thought Process:  logical  Orientation:  Full (Time, Place, and Person)  Thought Content:  Hallucinations: Auditory Command:  states voices tell her to hurt herself ,was worse last night  Suicidal Thoughts:  Yes.  without intent/plan cannot contract, 1:1 in place  Homicidal Thoughts:  No   Memory:  Immediate;   Fair Recent;   Fair Remote;   Fair  Judgement:  Impaired  Insight:  Lacking  Psychomotor Activity:  Normal  Concentration:  Fair  Recall:  Good  Fund of Knowledge:Good  Language: Fair  Akathisia:  Negative  Handed:  Right  AIMS (if indicated):     Assets:  Desire for Improvement Resilience  Sleep:  Number of Hours: 5.5   Musculoskeletal: Strength & Muscle Tone: within normal limits Gait & Station: normal Patient leans: N/A  Current Medications: Current Facility-Administered Medications  Medication Dose Route Frequency Provider Last Rate Last Dose  . acetaminophen (TYLENOL) tablet 650 mg  650 mg Oral Q6H PRN Dara Hoyer, PA-C   650 mg at 02/26/14 1308  . albuterol (PROVENTIL HFA;VENTOLIN HFA) 108 (90 BASE) MCG/ACT inhaler 2 puff  2 puff Inhalation Q6H PRN Dara Hoyer, PA-C      . alum & mag hydroxide-simeth (MAALOX/MYLANTA) 200-200-20 MG/5ML suspension 30 mL  30 mL Oral Q4H PRN Jessy Oto  Harold Hedge, PA-C      . atenolol (TENORMIN) tablet 100 mg  100 mg Oral Daily Dara Hoyer, PA-C   100 mg at 02/26/14 0750  . benztropine (COGENTIN) tablet 1 mg  1 mg Oral BID Dara Hoyer, PA-C   1 mg at 02/26/14 0751  . ciprofloxacin (CIPRO) tablet 500 mg  500 mg Oral BID Dara Hoyer, PA-C   500 mg at 02/26/14 0751  . diphenhydrAMINE (BENADRYL) capsule 50 mg  50 mg Oral  Q6H PRN [name], MD      . LORazepam (ATIVAN) tablet 1 mg  1 mg Oral Q6H PRN Benjamine Mola, FNP   1 mg at 02/23/14 1059   And  . haloperidol (HALDOL) tablet 5 mg  5 mg Oral Q6H PRN Benjamine Mola, FNP   5 mg at 02/23/14 1058  . hydrALAZINE (APRESOLINE) tablet 25 mg  25 mg Oral TID Dara Hoyer, PA-C   25 mg at 02/26/14 0751  . hydrochlorothiazide (HYDRODIURIL) tablet 25 mg  25 mg Oral Daily Dara Hoyer, PA-C   25 mg at 02/26/14 0751  . hydrOXYzine (ATARAX/VISTARIL) tablet 25 mg  25 mg Oral Q6H PRN Dara Hoyer, PA-C   25 mg at 02/26/14 1352  . lamoTRIgine (LAMICTAL) tablet 200 mg  200 mg Oral Daily [name], MD   200 mg at 02/26/14 0750  . loratadine (CLARITIN) tablet 10 mg  10 mg Oral Daily Dara Hoyer, PA-C   10 mg at 02/26/14 3149  . magnesium hydroxide (MILK OF MAGNESIA) suspension 30 mL  30 mL Oral Daily PRN Dara Hoyer, PA-C      . nicotine (NICODERM CQ - dosed in mg/24 hours) patch 21 mg  21 mg Transdermal Q0600 Dara Hoyer, PA-C   21 mg at 02/25/14 0600  . Oxcarbazepine (TRILEPTAL) tablet 600 mg  600 mg Oral BID [name], MD   600 mg at 02/26/14 0752  . pantoprazole (PROTONIX) EC tablet 40 mg  40 mg Oral QHS Dara Hoyer, PA-C   40 mg at 02/25/14 2137  . QUEtiapine (SEROQUEL XR) 24 hr tablet 800 mg  800 mg Oral QHS [name], MD   800 mg at 02/25/14 2137  . risperiDONE (RISPERDAL M-TABS) disintegrating tablet 2 mg  2 mg Oral QHS [name], MD   2 mg at 02/25/14 2137  . thiamine (VITAMIN B-1) tablet 100 mg  100 mg Oral Daily Dara Hoyer, PA-C   100 mg at 02/26/14 7026  . traZODone (DESYREL) tablet 50 mg  50 mg Oral QHS PRN Dara Hoyer, PA-C   50 mg at 02/26/14 0107    Lab Results:  Results for orders placed during the hospital encounter of 02/17/14 (from the past 48 hour(s))  GLUCOSE, CAPILLARY     Status: Abnormal   Collection Time    02/24/14  5:09 PM      Result Value Ref Range   Glucose-Capillary 157 (*) 70 - 99 mg/dL   GLUCOSE, CAPILLARY     Status: Abnormal   Collection Time    02/25/14  5:54 AM      Result Value Ref Range   Glucose-Capillary 180 (*) 70 - 99 mg/dL   Comment 1 Notify RN    GLUCOSE, CAPILLARY     Status: Abnormal   Collection Time    02/25/14 11:42 AM      Result Value Ref Range   Glucose-Capillary 156 (*) 70 - 99  mg/dL  GLUCOSE, CAPILLARY     Status: Abnormal   Collection Time    02/25/14  5:07 PM      Result Value Ref Range   Glucose-Capillary 205 (*) 70 - 99 mg/dL  GLUCOSE, CAPILLARY     Status: Abnormal   Collection Time    02/26/14  6:14 AM      Result Value Ref Range   Glucose-Capillary 118 (*) 70 - 99 mg/dL   Comment 1 Notify RN    GLUCOSE, CAPILLARY     Status: Abnormal   Collection Time    02/26/14 12:22 PM      Result Value Ref Range   Glucose-Capillary 136 (*) 70 - 99 mg/dL    Physical Findings: AIMS: Facial and Oral Movements Muscles of Facial Expression: None, normal Lips and Perioral Area: None, normal Jaw: None, normal Tongue: None, normal,Extremity Movements Upper (arms, wrists, hands, fingers): None, normal Lower (legs, knees, ankles, toes): None, normal, Trunk Movements Neck, shoulders, hips: None, normal, Overall Severity Severity of abnormal movements (highest score from questions above): None, normal Incapacitation due to abnormal movements: None, normal Patient's awareness of abnormal movements (rate only patient's report): No Awareness, Dental Status Current problems with teeth and/or dentures?: No Does patient usually wear dentures?: No  CIWA:  CIWA-Ar Total: 1 COWS:  COWS Total Score: 1       Treatment Plan Summary: Daily contact with patient to assess and evaluate symptoms and progress in treatment Medication management See below  Assessment and Plan: Continue inpatient treatment. Patient today reports worsening AH last night and having a rough night. Will readjust her medications. Patient will be continued on 1:1 today. Increase  Lamictal 200  mg QDAY. Could titrate dose higher.  Continue CIWA protocol. Incresae Seroquel XR to  800 mg po qhs. Reduce Risperidone to 2 mg po qhs.  Continue Trileptal 600 mg BID Continue HCTZ 25 mg po daily ,Hydralazine 25 mg po tid for HTN. Continue Hydroxyzine prn for anxiety. CSW will work on disposition.   Medical Decision Making Problem Points:  Established problem, stable/improving (1), Review of last therapy session (1) and Review of psycho-social stressors (1) Data Points:  Review or order clinical lab tests (1) Review of medication regiment & side effects (2) Review of new medications or change in dosage (2)  I certify that inpatient services furnished can reasonably be expected to improve the patient's condition.   Benjamine Mola, FNP-BC 02/26/2014, 2:15 PM I agreed with findings and treatment plan of this patient

## 2014-02-26 NOTE — Progress Notes (Signed)
Nursing 1:1 note D:Pt observed sleeping in bed with eyes closed. RR even and unlabored. No distress noted. A: 1:1 observation continues for safety  R: pt remains safe  

## 2014-02-26 NOTE — Progress Notes (Signed)
Informed rn tha pt blood pressure was low

## 2014-02-26 NOTE — Plan of Care (Signed)
Problem: Diagnosis: Increased Risk For Suicide Attempt Goal: LTG-Patient Will Show Positive Response to Medication LTG (by discharge) : Patient will show positive response to medication and will participate in the development of the discharge plan.  Outcome: Progressing Pt observed in dayroom playing cards Goal: LTG-Patient Will Report Absence of Withdrawal Symptoms LTG (by discharge): Patient will report absence of withdrawal symptoms.  Outcome: Completed/Met Date Met:  02/26/14 Pt not having withdrawal Sx Goal: STG-Patient Will Attend All Groups On The Unit Outcome: Progressing Pt stated she went to all the groups today     Problem: Alteration in mood Goal: LTG-Pt's behavior demonstrates decreased signs of depression Rates depression at a 10 today. Goal is 4 or less on a 10 scale at d/c. Goal not met. R [place] LCSW 02/22/2014 11:06 AM   Pt is suicidal today, and not able to contract for safety. On 1:1 staffing. Goal not met. R north LCSW 02/23/2014 2:55 PM  Outcome: Progressing Pt observed interacting on unit with staff and patients

## 2014-02-26 NOTE — [facility] Group Notes (Signed)
[place] Group Notes:  Healthy coping skills  Date:  02/26/2014  Time:  10:20 AM  Type of Therapy:  Nurse Education  Participation Level:  Active  Participation Quality:  Appropriate  Affect:  Appropriate  Cognitive:  Alert  Insight:  Appropriate  Engagement in Group:  Engaged  Modes of Intervention:  Discussion  Summary of Progress/Problems:  Maureen Swanson 02/26/2014, 10:20 AM

## 2014-02-26 NOTE — [facility] Group Notes (Signed)
[place] Group Notes: (Clinical Social Work)   02/26/2014      Type of Therapy:  Group Therapy   Participation Level:  Did Not Attend    Selmer Dominion, LCSW 02/26/2014, 12:06 PM

## 2014-02-26 NOTE — Progress Notes (Signed)
1-1 note: D. Pt in dayroom at this time, interacting with sitter and finishing dinner that was brought for her. Pt has endorsed auditory hallucinations to kill herself today and reports that the voices are pretty bad today. Pt has received all medications without incident this afternoon. A. Support and encouragement provided 1-1 continued at this time. R. Safety maintained, will continue to monitor.

## 2014-02-27 DIAGNOSIS — F1420 Cocaine dependence, uncomplicated: Secondary | ICD-10-CM

## 2014-02-27 DIAGNOSIS — F1020 Alcohol dependence, uncomplicated: Secondary | ICD-10-CM

## 2014-02-27 LAB — GLUCOSE, CAPILLARY
GLUCOSE-CAPILLARY: 175 mg/dL — AB (ref 70–99)
GLUCOSE-CAPILLARY: 151 mg/dL — AB (ref 70–99)
GLUCOSE-CAPILLARY: 211 mg/dL — AB (ref 70–99)
Glucose-Capillary: 164 mg/dL — ABNORMAL HIGH (ref 70–99)

## 2014-02-27 MED ORDER — TRAZODONE HCL 100 MG PO TABS
100.0000 mg | ORAL_TABLET | Freq: Every evening | ORAL | Status: DC | PRN
  Filled 2014-02-27: qty 14

## 2014-02-27 MED ORDER — RISPERIDONE 1 MG PO TBDP
1.0000 mg | ORAL_TABLET | Freq: Every day | ORAL | Status: DC
  Administered 2014-02-27: 1 mg via ORAL
  Filled 2014-02-27 (×2): qty 1

## 2014-02-27 MED ORDER — RISPERIDONE 1 MG PO TBDP: 1.0000 mg | ORAL_TABLET | Freq: Every day | ORAL | Status: DC

## 2014-02-27 NOTE — Plan of Care (Signed)
Problem: Alteration in mood Goal: STG-Patient is able to discuss feelings and issues (Patient is able to discuss feelings and issues leading to depression)  Outcome: Progressing Patient is able to speak openly with MHT that she has been 1:1 observation with. Patient has been more open with writer about how she is feeling

## 2014-02-27 NOTE — Progress Notes (Signed)
Patient ID: Maureen Swanson, female   DOB: March 15, 1966, 48 y.o.   MRN: [id number]  1:1 Nursing Note-  Patient is seen in the dayroom attending her afternoon processing group with LCSW. Patient is not speaking but looking intently at social worker appearing to be focused on what is being said. No distress noted, respirations are even and unlabored. 1:1 is continued for safety. MHT is within reach and q15 minute safety checks are maintained.

## 2014-02-27 NOTE — Progress Notes (Addendum)
Patient ID: Maureen Swanson, female   DOB: 27-Feb-1966, 48 y.o.   MRN: [id number] [facility] MD Progress Note  02/27/2014 1:05 PM RYLYN ZAWISTOWSKI  MRN:  [id number] Subjective:     Patient states "I'm feeling a little bit better. But does not feel safe since continue to feel HI towards everyone in general.   Objective:  48 year old female admitted  due to suicidal ideations and command hallucinations. Patient continues to be anxious with periodic irritability . Patient reports AH yesterday ,but denies today. Also reports SI yesterday ,when she took out a spoon and tried to scratch self in an attempt to hurt self. Patient reports sleep as improving.  Per staff patient is irritable ,reports AH ,and is homicidal.  Diagnosis:  DSM 5  Primary psychiatric diagnosis Schizoaffective disorder,bipolar type ,multiple episodes ,currently in acute episode  Secondary psychiatric diagnosis: Alcohol use disorder Alcohol withdrawal Cocaine use disorder  Non psychiatric diagnosis; GERD (gastroesophageal reflux disease)  Migraines  TMJ (temporomandibular joint disorder)  Asthma  Arthritis   Gastric ulcer per hx Atypical ductal hyperplasia of breast (03/2012) Hypertension Diabetes mellitus  diet-controlled    Total Time spent with patient: 25 minutes   ADL's:  Fair   Sleep: fair  Appetite: good   AEB (as evidenced by):  Psychiatric Specialty Exam: Physical Exam  Constitutional: She is oriented to person, place, and time. She appears well-developed and well-nourished.  Neck: Normal range of motion.  Neurological: She is alert and oriented to person, place, and time.  Psychiatric: Her speech is normal. Her mood appears anxious. Her affect is angry (improving) and labile. She is aggressive (improving) and actively hallucinating (yesterday). Cognition and memory are normal. She expresses impulsivity. She expresses homicidal (generalized to everyone ,no plan) and suicidal (passive) ideation.    Review  of Systems  Constitutional: Negative for fever and chills.  Respiratory: Negative for cough and shortness of breath.   Psychiatric/Behavioral: Positive for depression, suicidal ideas (passive), hallucinations and substance abuse. The patient is nervous/anxious.     Blood pressure 118/85, pulse 88, temperature 98.1 F (36.7 C), temperature source Oral, resp. rate 20, height 5\' 4"  (1.626 m), weight 91.627 kg (202 lb), SpO2 99.00%.Body mass index is 34.66 kg/(m^2).  General Appearance: Fairly Groomed  Engineer, water::  Fair  Speech:  Slow  Volume:  Normal  Mood:  Anxious and Depressed  Affect:  Blunt  Thought Process:  logical  Orientation:  Full (Time, Place, and Person)  Thought Content:  Hallucinations: Auditory  Suicidal Thoughts:  Yes.  without intent/plan contracts for safety, tried to hurt self with a spoon yesterday,scratched self.  Homicidal Thoughts:  Yes.  without intent/plan to people in general  Memory:  Immediate;   Fair Recent;   Fair Remote;   Fair  Judgement:  Impaired  Insight:  Lacking  Psychomotor Activity:  Normal  Concentration:  Fair  Recall:  Good  Fund of Knowledge:Good  Language: Fair  Akathisia:  Negative  Handed:  Right  AIMS (if indicated):     Assets:  Desire for Improvement Resilience  Sleep:  Number of Hours: 5.75   Musculoskeletal: Strength & Muscle Tone: within normal limits Gait & Station: normal Patient leans: N/A  Current Medications: Current Facility-Administered Medications  Medication Dose Route Frequency Provider Last Rate Last Dose  . acetaminophen (TYLENOL) tablet 650 mg  650 mg Oral Q6H PRN Dara Hoyer, PA-C   650 mg at 02/27/14 2119  . albuterol (PROVENTIL HFA;VENTOLIN HFA) 108 (90 BASE)  MCG/ACT inhaler 2 puff  2 puff Inhalation Q6H PRN Dara Hoyer, PA-C      . alum & mag hydroxide-simeth (MAALOX/MYLANTA) 200-200-20 MG/5ML suspension 30 mL  30 mL Oral Q4H PRN Dara Hoyer, PA-C      . atenolol (TENORMIN) tablet 100 mg   100 mg Oral Daily Dara Hoyer, PA-C   100 mg at 02/27/14 0722  . benztropine (COGENTIN) tablet 1 mg  1 mg Oral BID Dara Hoyer, PA-C   1 mg at 02/27/14 5750  . ciprofloxacin (CIPRO) tablet 500 mg  500 mg Oral BID Dara Hoyer, PA-C   500 mg at 02/27/14 5183  . diphenhydrAMINE (BENADRYL) capsule 50 mg  50 mg Oral Q6H PRN [name], MD      . LORazepam (ATIVAN) tablet 1 mg  1 mg Oral Q6H PRN Benjamine Mola, FNP   1 mg at 02/23/14 1059   And  . haloperidol (HALDOL) tablet 5 mg  5 mg Oral Q6H PRN Benjamine Mola, FNP   5 mg at 02/23/14 1058  . hydrALAZINE (APRESOLINE) tablet 25 mg  25 mg Oral BID Benjamine Mola, FNP   25 mg at 02/27/14 3582  . hydrochlorothiazide (HYDRODIURIL) tablet 25 mg  25 mg Oral Daily Dara Hoyer, PA-C   25 mg at 02/27/14 5189  . hydrOXYzine (ATARAX/VISTARIL) tablet 25 mg  25 mg Oral Q6H PRN Dara Hoyer, PA-C   25 mg at 02/27/14 1300  . lamoTRIgine (LAMICTAL) tablet 200 mg  200 mg Oral Daily [name], MD   200 mg at 02/27/14 0823  . loratadine (CLARITIN) tablet 10 mg  10 mg Oral Daily Dara Hoyer, PA-C   10 mg at 02/27/14 8421  . magnesium hydroxide (MILK OF MAGNESIA) suspension 30 mL  30 mL Oral Daily PRN Dara Hoyer, PA-C      . nicotine (NICODERM CQ - dosed in mg/24 hours) patch 21 mg  21 mg Transdermal Q0600 Dara Hoyer, PA-C   21 mg at 02/27/14 0312  . Oxcarbazepine (TRILEPTAL) tablet 600 mg  600 mg Oral BID [name], MD   600 mg at 02/27/14 0823  . pantoprazole (PROTONIX) EC tablet 40 mg  40 mg Oral QHS Dara Hoyer, PA-C   40 mg at 02/26/14 2103  . QUEtiapine (SEROQUEL XR) 24 hr tablet 800 mg  800 mg Oral QHS [name], MD   800 mg at 02/26/14 2103  . risperiDONE (RISPERDAL M-TABS) disintegrating tablet 1 mg  1 mg Oral QHS Jaeleen Inzunza, MD      . thiamine (VITAMIN B-1) tablet 100 mg  100 mg Oral Daily Dara Hoyer, PA-C   100 mg at 02/27/14 8118  . traZODone (DESYREL) tablet 100 mg  100 mg Oral QHS PRN [name], MD        Lab Results:  Results for orders placed during the hospital encounter of 02/17/14 (from the past 48 hour(s))  GLUCOSE, CAPILLARY     Status: Abnormal   Collection Time    02/25/14  5:07 PM      Result Value Ref Range   Glucose-Capillary 205 (*) 70 - 99 mg/dL  GLUCOSE, CAPILLARY     Status: Abnormal   Collection Time    02/26/14  6:14 AM      Result Value Ref Range   Glucose-Capillary 118 (*) 70 - 99 mg/dL   Comment 1 Notify RN    GLUCOSE, CAPILLARY  Status: Abnormal   Collection Time    02/26/14 12:22 PM      Result Value Ref Range   Glucose-Capillary 136 (*) 70 - 99 mg/dL  GLUCOSE, CAPILLARY     Status: Abnormal   Collection Time    02/26/14  5:15 PM      Result Value Ref Range   Glucose-Capillary 219 (*) 70 - 99 mg/dL   Comment 1 Notify RN    GLUCOSE, CAPILLARY     Status: Abnormal   Collection Time    02/27/14  5:59 AM      Result Value Ref Range   Glucose-Capillary 164 (*) 70 - 99 mg/dL   Comment 1 Notify RN    GLUCOSE, CAPILLARY     Status: Abnormal   Collection Time    02/27/14 11:56 AM      Result Value Ref Range   Glucose-Capillary 175 (*) 70 - 99 mg/dL    Physical Findings: AIMS: Facial and Oral Movements Muscles of Facial Expression: None, normal Lips and Perioral Area: None, normal Jaw: None, normal Tongue: None, normal,Extremity Movements Upper (arms, wrists, hands, fingers): None, normal Lower (legs, knees, ankles, toes): None, normal, Trunk Movements Neck, shoulders, hips: None, normal, Overall Severity Severity of abnormal movements (highest score from questions above): None, normal Incapacitation due to abnormal movements: None, normal Patient's awareness of abnormal movements (rate only patient's report): No Awareness, Dental Status Current problems with teeth and/or dentures?: No Does patient usually wear dentures?: No  CIWA:  CIWA-Ar Total: 0 COWS:  COWS Total Score: 1       Treatment Plan Summary: Daily contact  with patient to assess and evaluate symptoms and progress in treatment Medication management See below  Assessment and Plan: Continue inpatient treatment. Patient will be continued on 1:1 today since she tried to hurt self with a spoon and also has HI towards people in general today. Continue Lamictal 200  mg QDAY. Could titrate dose higher.  Continue CIWA protocol. Continue Seroquel XR to  800 mg po qhs. Reduce Risperidone to 1 mg po qhs.  Continue Trileptal 600 mg BID Continue HCTZ 25 mg po daily ,Hydralazine 25 mg po tid for HTN. Continue Hydroxyzine prn for anxiety. CSW will work on disposition.   Medical Decision Making Problem Points:  Established problem, stable/improving (1), Review of last therapy session (1) and Review of psycho-social stressors (1) Data Points:  Review or order clinical lab tests (1) Review of medication regiment & side effects (2) Review of new medications or change in dosage (2)  I certify that inpatient services furnished can reasonably be expected to improve the patient's condition.   Mariette Cowley,MD 02/27/2014, 1:05 PM

## 2014-02-27 NOTE — Progress Notes (Signed)
Adult Psychoeducational Group Note  Date:  02/27/2014 Time:  11:08 PM  Group Topic/Focus:  Wrap-Up Group:   The focus of this group is to help patients review their daily goal of treatment and discuss progress on daily workbooks.  Participation Level:  Minimal  Participation Quality:  Appropriate  Affect:  Flat  Cognitive:  Lacking  Insight: Limited  Engagement in Group:  Limited  Modes of Intervention:  Socialization and Support  Additional Comments:  Patient attended and participated in group tonight. She reports that the day was going good until about 6:30 when she took some medication. She went to groups and had her meals.   Salley Scarlet [facility] 02/27/2014, 11:08 PM

## 2014-02-27 NOTE — Progress Notes (Signed)
Patient ID: Maureen Swanson, female   DOB: [birth year], 48 y.o.   MRN: [id number]  1:1 Nursing Note-  Patient is seen in the dayroom after returning from dinner. Patient is sitting but not speaking. Patient is calm, respirations are even and unlabored. MHT is within reach and 1:1 is continued. Q15 minute safety checks are maintained as well.

## 2014-02-27 NOTE — Progress Notes (Signed)
1:1 Note - Patient is currently lying in bed asleep with eyes closed, no distress noted. 1:1 continues and patient remains safety with MHT at bedside.

## 2014-02-27 NOTE — BHH Group Notes (Signed)
[place] LCSW Group Therapy  02/27/2014 1:15 pm  Type of Therapy: Process Group Therapy  Participation Level:  Active  Participation Quality:  Appropriate  Affect:  Flat  Cognitive:  Oriented  Insight:  Improving  Engagement in Group:  Limited  Engagement in Therapy:  Limited  Modes of Intervention:  Activity, Clarification, Education, Problem-solving and Support  Summary of Progress/Problems: Today's group addressed the issue of overcoming obstacles.  Patients were asked to identify their biggest obstacle post d/c that stands in the way of their on-going success, and then problem solve as to how to manage this. Maureen Swanson is in a good mood today, and is an active participant for the first time.  She talked about her ability to stay clean and sober for 2 years when she was being drug tested.  Said having that [external structure] was good for her, which is why she is trying to get into rehab again, or at least knowing that she has a bed in a shelter.  Talked about her biggest obstacle as staying positive in the face of obstacles of no income and vulnerability to relapse.  But also acknowledges "I have made some poor decisions along the way which has ended me up in this situation.  I have to make the best of what I have."  [name] 02/27/2014   3:34 PM

## 2014-02-27 NOTE — Progress Notes (Signed)
1:1 Nursing Note  D: Pt resting, eyes closed, respirations even and unlabored. No distress noted. Sitter at bedside. A: Continue 1:1 observation for safety. R: Pt remains safe on the unit.

## 2014-02-27 NOTE — Progress Notes (Signed)
Patient ID: Maureen Swanson, female   DOB: 06/27/1965, 48 y.o.   MRN: [id number]  1:1 Nursing Note-  Patient is seen in the day room upon approach. Patient was calm and no distress noted. No conversation noted at this time. Patient continues on 1:1 for safety, and MHT is within reach.  D: Pt. Denies SI and A/V hallucinations. Patient endorses HI, patient does not elaborate but states it is not directed toward anyone in particular.  Patient does report that she has pain on her left side and was given PRN Tylenol. Patient reported slight relief upon reassessment.  A: Support and encouragement provided to the patient to come to Probation officer with questions or concerns. Medications administered to patient per physician's orders.  R: Patient is receptive and cooperative but minimal with this Probation officer. Patient is seen in the milieu and is attending some groups. Q15 minute checks are maintained for safety.

## 2014-02-28 LAB — GLUCOSE, CAPILLARY
GLUCOSE-CAPILLARY: 273 mg/dL — AB (ref 70–99)
Glucose-Capillary: 139 mg/dL — ABNORMAL HIGH (ref 70–99)
Glucose-Capillary: 270 mg/dL — ABNORMAL HIGH (ref 70–99)
Glucose-Capillary: 186 mg/dL — ABNORMAL HIGH (ref 70–99)

## 2014-02-28 MED ORDER — INSULIN ASPART 100 UNIT/ML ~~LOC~~ SOLN
0.0000 [IU] | Freq: Three times a day (TID) | SUBCUTANEOUS | Status: DC
  Administered 2014-02-28: 8 [IU] via SUBCUTANEOUS
  Administered 2014-03-01: 5 [IU] via SUBCUTANEOUS
  Administered 2014-03-01: 2 [IU] via SUBCUTANEOUS
  Administered 2014-03-01: 5 [IU] via SUBCUTANEOUS
  Administered 2014-03-02: 2 [IU] via SUBCUTANEOUS
  Administered 2014-03-02: 3 [IU] via SUBCUTANEOUS

## 2014-02-28 MED ORDER — LAMOTRIGINE 100 MG PO TABS
250.0000 mg | ORAL_TABLET | Freq: Every day | ORAL | Status: DC
  Administered 2014-03-01 – 2014-03-02 (×2): 250 mg via ORAL
  Filled 2014-02-28: qty 35
  Filled 2014-02-28 (×3): qty 2.5
  Filled 2014-02-28: qty 35

## 2014-02-28 MED ORDER — IBUPROFEN 400 MG PO TABS
400.0000 mg | ORAL_TABLET | Freq: Three times a day (TID) | ORAL | Status: DC
  Administered 2014-02-28 – 2014-03-02 (×8): 400 mg via ORAL
  Filled 2014-02-28 (×14): qty 1

## 2014-02-28 NOTE — Progress Notes (Signed)
Adult Psychoeducational Group Note  Date:  02/28/2014 Time:  9:52 PM  Group Topic/Focus:  Wrap-Up Group:   The focus of this group is to help patients review their daily goal of treatment and discuss progress on daily workbooks.  Participation Level:  Minimal  Participation Quality:  Appropriate  Affect:  Appropriate  Cognitive:  Appropriate  Insight: Limited  Engagement in Group:  Engaged  Modes of Intervention:  Socialization and Support  Additional Comments:  Patient attended and participated in group tonight. She reports that at first her day was OK. After talking to her son today she became sad because she is planning to move out of town and away from him and his family. Today she went to her groups, took a nap and took her medication.   Salley Scarlet [facility] 02/28/2014, 9:52 PM

## 2014-02-28 NOTE — BHH Group Notes (Signed)
[place] LCSW Group Therapy  02/28/2014 11:25 AM  Type of Therapy:  Group Therapy  Participation Level:  Minimal  Participation Quality:  Attentive  Affect:  Blunted  Cognitive:  Lacking  Insight:  Limited  Engagement in Therapy:  Limited  Modes of Intervention:  Confrontation, Discussion, Education, Exploration, Limit-setting, Orientation, Rapport Building, Art therapist, Socialization and Support  Summary of Progress/Problems: Feelings around Diagnosis: Maureen Swanson was attentive during group and presented with flat affect. She shared that she does not like having a mental health diagnosis although she was able to acknowledge the benefit of knowing what she has. Maureen Swanson shared that she feels like a [place] pig at times and has been misdiagnosed in the past. Maureen Swanson shows some progress in the group setting and limited insight AEB her ability to participate in group discussion. She stated that "side effects to medications" are difficult for her to manage. Maureen Swanson further shared that her family relationships improve due to her diagnosis because "they can understand me better now."   Smart, Alicia Amel 02/28/2014, 11:25 AM

## 2014-02-28 NOTE — Progress Notes (Signed)
Patient ID: Maureen Swanson, female   DOB: 26-Dec-1965, 47 y.o.   MRN: [id number]  D: Writer was informed that pt was feeling anxious. On Warden/ranger observed the pt sitting with her fist clinched and tapping on the chair. Pt informed the writer that she was "hearing racing voices".  Asked the writer for meds because, "she didn't want to get to that anger stage like she was last week".  A:  Pt was given prn ativan and informed of scheduled hs meds that she could get as early as 2100. Support and encouragement was offered. 15 min checks continued for safety.  R: Pt remains safe.

## 2014-02-28 NOTE — Progress Notes (Signed)
Patient ID: Maureen Swanson, female   DOB: 11-Dec-1965, 48 y.o.   MRN: [id number] [facility] MD Progress Note  02/28/2014 12:41 PM KHRISTINA JANOTA  MRN:  [id number] Subjective:     Patient states "I'm feeling OK today,bu had an episode yesterday when I was anxious and had AH and had to punch my chair and needed prn's.   Objective:  48 year old female admitted  due to suicidal ideations and command hallucinations. Patient reports feeling a bit improved today ,with regards to her anxiety as well as AH.Patient reports having an episode yesterday in groups ,when she needed prn medications to calm her down. Today she denies any AH,VH,SI or HI . Patient reports sleep as fair ,reports appetite as reduced.  Per staff patient has periods of irritbality ,but has improved from yesterday.  Will DC 1:1 ,will try to monitor patient closely.  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 She appears well-developed and well-nourished.  Neck: Normal range of motion.  Neurological: She is alert and oriented to person, place, and time.  Psychiatric: Her speech is normal. Her mood appears anxious (improving). Her affect is angry (improving) and labile. She is aggressive (improving). She is not actively hallucinating. Cognition and memory are normal. She expresses  impulsivity. She expresses no homicidal and no suicidal ideation.    Review of Systems  Constitutional: Negative for fever and chills.  Respiratory: Negative for cough and shortness of breath.   Psychiatric/Behavioral: Positive for depression and substance abuse. Negative for suicidal ideas and hallucinations. The patient is nervous/anxious.     Blood pressure 128/88, pulse 93, temperature 97.9 F (36.6 C), temperature source Oral, resp. rate 20, height 5\' 4"  (1.626 m), weight 91.627 kg (202 lb), SpO2 99.00%.Body mass index is 34.66 kg/(m^2).  General Appearance: Fairly Groomed  Engineer, water::  Fair  Speech:  Slow  Volume:  Normal  Mood:  Anxious and Depressed  Affect:  Blunt  Thought Process:  logical  Orientation:  Full (Time, Place, and Person)  Thought Content:  WDL  Suicidal Thoughts:  No   Homicidal Thoughts:  No  Memory:  Immediate;   Fair Recent;   Fair Remote;   Fair  Judgement:  Impaired  Insight:  Lacking  Psychomotor Activity:  Normal  Concentration:  Fair  Recall:  Good  Fund of Knowledge:Good  Language: Fair  Akathisia:  Negative  Handed:  Right  AIMS (if indicated):     Assets:  Desire for Improvement Resilience  Sleep:  Number of Hours: 6   Musculoskeletal: Strength & Muscle Tone: within normal limits Gait & Station: normal Patient leans: N/A  Current Medications: Current Facility-Administered Medications  Medication Dose Route Frequency Provider Last Rate Last Dose  . acetaminophen (TYLENOL) tablet 650 mg  650 mg Oral Q6H PRN Dara Hoyer, PA-C   650 mg at 02/28/14 0210  . albuterol (PROVENTIL HFA;VENTOLIN HFA) 108 (90 BASE) MCG/ACT inhaler  2 puff  2 puff Inhalation Q6H PRN Dara Hoyer, PA-C      . alum & mag hydroxide-simeth (MAALOX/MYLANTA) 200-200-20 MG/5ML suspension 30 mL  30 mL Oral Q4H PRN Dara Hoyer, PA-C      . atenolol (TENORMIN) tablet 100 mg  100 mg Oral Daily Dara Hoyer, PA-C   100 mg at 02/28/14 0843  . benztropine  (COGENTIN) tablet 1 mg  1 mg Oral BID Dara Hoyer, PA-C   1 mg at 02/28/14 0844  . ciprofloxacin (CIPRO) tablet 500 mg  500 mg Oral BID Dara Hoyer, PA-C   500 mg at 02/28/14 0846  . diphenhydrAMINE (BENADRYL) capsule 50 mg  50 mg Oral Q6H PRN [name], MD      . LORazepam (ATIVAN) tablet 1 mg  1 mg Oral Q6H PRN Benjamine Mola, FNP   1 mg at 02/27/14 2014   And  . haloperidol (HALDOL) tablet 5 mg  5 mg Oral Q6H PRN Benjamine Mola, FNP   5 mg at 02/23/14 1058  . hydrALAZINE (APRESOLINE) tablet 25 mg  25 mg Oral BID Benjamine Mola, FNP   25 mg at 02/28/14 0847  . hydrochlorothiazide (HYDRODIURIL) tablet 25 mg  25 mg Oral Daily Dara Hoyer, PA-C   25 mg at 02/28/14 0847  . hydrOXYzine (ATARAX/VISTARIL) tablet 25 mg  25 mg Oral Q6H PRN Dara Hoyer, PA-C   25 mg at 02/27/14 1300  . ibuprofen (ADVIL,MOTRIN) tablet 400 mg  400 mg Oral TID [name], MD   400 mg at 02/28/14 0852  . [START ON 03/01/2014] lamoTRIgine (LAMICTAL) tablet 250 mg  250 mg Oral Daily Lanique Gonzalo, MD      . loratadine (CLARITIN) tablet 10 mg  10 mg Oral Daily Dara Hoyer, PA-C   10 mg at 02/28/14 0845  . magnesium hydroxide (MILK OF MAGNESIA) suspension 30 mL  30 mL Oral Daily PRN Dara Hoyer, PA-C      . nicotine (NICODERM CQ - dosed in mg/24 hours) patch 21 mg  21 mg Transdermal Q0600 Dara Hoyer, PA-C   21 mg at 02/28/14 0640  . Oxcarbazepine (TRILEPTAL) tablet 600 mg  600 mg Oral BID [name], MD   600 mg at 02/28/14 0846  . pantoprazole (PROTONIX) EC tablet 40 mg  40 mg Oral QHS Dara Hoyer, PA-C   40 mg at 02/27/14 2125  . QUEtiapine (SEROQUEL XR) 24 hr tablet 800 mg  800 mg Oral QHS [name], MD   800 mg at 02/27/14 2125  . thiamine (VITAMIN B-1) tablet 100 mg  100 mg Oral Daily Dara Hoyer, PA-C   100 mg at 02/28/14 0845  . traZODone (DESYREL) tablet 100 mg  100 mg Oral QHS PRN [name], MD        Lab Results:  Results for orders placed during the  hospital encounter of 02/17/14 (from the past 48 hour(s))  GLUCOSE, CAPILLARY     Status: Abnormal   Collection Time    02/26/14  5:15 PM      Result Value Ref Range   Glucose-Capillary 219 (*) 70 - 99 mg/dL   Comment 1 Notify RN    GLUCOSE, CAPILLARY     Status: Abnormal   Collection Time    02/27/14  5:59 AM      Result Value Ref Range   Glucose-Capillary 164 (*) 70 - 99 mg/dL   Comment 1 Notify RN  GLUCOSE, CAPILLARY     Status: Abnormal   Collection Time    02/27/14 11:56 AM      Result Value Ref Range   Glucose-Capillary 175 (*) 70 - 99 mg/dL  GLUCOSE, CAPILLARY     Status: Abnormal   Collection Time    02/27/14  4:47 PM      Result Value Ref Range   Glucose-Capillary 151 (*) 70 - 99 mg/dL   Comment 1 Documented in Chart     Comment 2 Notify RN    GLUCOSE, CAPILLARY     Status: Abnormal   Collection Time    02/27/14  8:11 PM      Result Value Ref Range   Glucose-Capillary 211 (*) 70 - 99 mg/dL  GLUCOSE, CAPILLARY     Status: Abnormal   Collection Time    02/28/14  6:10 AM      Result Value Ref Range   Glucose-Capillary 139 (*) 70 - 99 mg/dL  GLUCOSE, CAPILLARY     Status: Abnormal   Collection Time    02/28/14 12:07 PM      Result Value Ref Range   Glucose-Capillary 270 (*) 70 - 99 mg/dL    Physical Findings: AIMS: Facial and Oral Movements Muscles of Facial Expression: None, normal Lips and Perioral Area: None, normal Jaw: None, normal Tongue: None, normal,Extremity Movements Upper (arms, wrists, hands, fingers): None, normal Lower (legs, knees, ankles, toes): None, normal, Trunk Movements Neck, shoulders, hips: None, normal, Overall Severity Severity of abnormal movements (highest score from questions above): None, normal Incapacitation due to abnormal movements: None, normal Patient's awareness of abnormal movements (rate only patient's report): No Awareness, Dental Status Current problems with teeth and/or dentures?: No Does patient usually wear  dentures?: No  CIWA:  CIWA-Ar Total: 0 COWS:  COWS Total Score: 1       Treatment Plan Summary: Daily contact with patient to assess and evaluate symptoms and progress in treatment Medication management See below  Assessment and Plan: Continue inpatient treatment. Patient reports improvement in her mood as well as HI. Will DC 1;1 since patient denies HI/SI. Incresae Lamictal to 250  mg QDAY for mood lability. Continue Seroquel XR to  800 mg po qhs. Will DC  Risperidone . Continue Trileptal 600 mg BID Continue HCTZ 25 mg po daily ,Hydralazine 25 mg po tid for HTN. Will place a diabetic consult for managing her DM.  Continue Hydroxyzine prn for anxiety. CSW will work on disposition. Patient has an appointment at [facility] on 03/02/14 for assessment . Patient to be discharged to shelter ,once stable.   Medical Decision Making Problem Points:  Established problem, stable/improving (1), Review of last therapy session (1) and Review of psycho-social stressors (1) Data Points:  Review or order clinical lab tests (1) Review or order medicine tests (1) Review of medication regiment & side effects (2)  I certify that inpatient services furnished can reasonably be expected to improve the patient's condition.   Makala Fetterolf,MD 02/28/2014, 12:41 PM

## 2014-02-28 NOTE — Progress Notes (Signed)
Patient ID: Maureen Swanson, female   DOB: [birth year], 49 y.o.   MRN: [id number] D: Client in bed, eyes closed, respirations even. A: Writer observed for s/s of distress. Staff will monitor 1:1 for safety. R: Client is safe on the unit, respirations, even, unlabored, no distress noted.

## 2014-02-28 NOTE — Plan of Care (Signed)
Problem: Ineffective individual coping Goal: STG: Pt will be able to identify effective and ineffective STG: Pt will be able to identify effective and ineffective coping patterns  Outcome: Progressing Pt has been able to control anger outbursts throughout the day. Pt has been taken off 1: 1 observation. She contracts with staff for safety. Goal: STG-Other (Specify): Outcome: Progressing Pt has verbalized feelings about possibly moving away from her son to receive additional treatment. She was tearful and able to express her feelings without having anger outbursts. She identified ways to keep in contact with him.

## 2014-02-28 NOTE — BHH Group Notes (Signed)
[place] Group Notes:  Goals group and Recovery  Date:  02/28/2014  Time:  10:26 AM  Type of Therapy:  Nurse Education  Participation Level:  Active  Participation Quality:  Appropriate  Affect:  Appropriate  Cognitive:  Alert  Insight:  Appropriate  Engagement in Group:  Engaged  Modes of Intervention:  Discussion  Summary of Progress/Problems:pt stated,"I want to keep taking my meds and try not to have an episode like yesterday. Pt would like to find somewhere to go when she leaves here  Delman Kitten 02/28/2014, 10:26 AM

## 2014-02-28 NOTE — Progress Notes (Signed)
Inpatient Diabetes Program Recommendations  AACE/ADA: New Consensus Statement on Inpatient Glycemic Control (2013)  Target Ranges:  Prepandial:   less than 140 mg/dL      Peak postprandial:   less than 180 mg/dL (1-2 hours)      Critically ill patients:  140 - 180 mg/dL   Reason for Assessment: Diabetes Consult  Diabetes history: DM2 - diet-controlled Outpatient Diabetes medications: None Current orders for Inpatient glycemic control: None  According to past medical records, pt has hx diet-controlled DM. No HgbA1C available for review. Since admission on 09/04, four blood sugars >180 mg/dL.     Results for KALYNA, PAOLELLA (MRN [id number]) as of 02/28/2014 14:33  Ref. Range 02/27/2014 11:56 02/27/2014 16:47 02/27/2014 20:11 02/28/2014 06:10 02/28/2014 12:07  Glucose-Capillary Latest Range: 70-99 mg/dL 175 (H) 151 (H) 211 (H) 139 (H) 270 (H)   Diet-controlled DM with hyperglycemia in the inpatient setting.  Recommendations: Add Novolog moderate tidwc and hs Check HgbA1C to assess glycemic control prior to hospitalization. Needs PCP to manage DM. ?[place] Will order Living With Diabetes book  Will follow while inpatient. Thank you. Lorenda Peck, RD, LDN, CDE Inpatient Diabetes Coordinator [phone]

## 2014-02-28 NOTE — Progress Notes (Signed)
D:Pt is attending groups this morning. Pt reports that she is controlling her outbursts and will come to staff with any problems. Pt reports that she is working on coping skills to control mood swings.  A:Offered support, encouragement and 1:1 observation. R:Pt denies si and hi. Safety maintained on the unit.

## 2014-02-28 NOTE — Tx Team (Signed)
  Interdisciplinary Treatment Plan Update   Date Reviewed:  02/28/2014  Time Reviewed:  8:15 AM  Progress in Treatment:   Attending groups: Yes Participating in groups: Yes Taking medication as prescribed: Yes  Tolerating medication: Yes Family/Significant other contact made: Yes  Patient understands diagnosis: Yes  Discussing patient identified problems/goals with staff: Yes  See initial care plan Medical problems stabilized or resolved: Yes Denies suicidal/homicidal ideation: Yes  In tx team Patient has not harmed self or others: Yes  For review of initial/current patient goals, please see plan of care.  Estimated Length of Stay:  2-4 days  Reason for Continuation of Hospitalization: Depression Medication stabilization  New Problems/Goals identified:  N/A  Discharge Plan or Barriers:   Has appointment at [facility] on [address].  Is pursuing shelter bed in the meantime.  Additional Comments:  "I'm feeling OK today,bu had an episode yesterday when I was anxious and had AH and had to punch my chair and needed prn's. " 48 year old female admitted due to suicidal ideations and command hallucinations. Patient reports feeling a bit improved today ,with regards to her anxiety as well as AH.Patient reports having an episode yesterday in groups ,when she needed prn medications to calm her down. Today she denies any AH,VH,SI or HI .  1:1 discontinued today.   Attendees:  Signature: Steva Colder, MD 02/28/2014 8:15 AM   Signature: Ripley Fraise, LCSW 02/28/2014 8:15 AM  Signature: Elmarie Shiley, NP 02/28/2014 8:15 AM  Signature: Mayra Neer, RN 02/28/2014 8:15 AM  Signature: Darrol Angel, RN 02/28/2014 8:15 AM  Signature:  02/28/2014 8:15 AM  Signature:   02/28/2014 8:15 AM  Signature:    Signature:    Signature:    Signature:    Signature:    Signature:      Scribe for Treatment Team:   Ripley Fraise, LCSW  02/28/2014 8:15 AM

## 2014-02-28 NOTE — Progress Notes (Signed)
Patient ID: Maureen Swanson, female   DOB: Jun 07, 1966, 48 y.o.   MRN: [id number]  D: When asked about her day, pt stated, "Not good. Supposed to be going to [facility], rather go there than back home." Pt stated that she's worried because she wanted to be closer to her son. Stated they "just started back talking a 1.5 yrs ago." Pt also informed the writer of a new insulin order that started today. Pt engaged Probation officer more in conversation today than previous days.   A:  Support and encouragement was offered. 15 min checks continued for safety.  R: Pt remains safe.

## 2014-03-01 LAB — GLUCOSE, CAPILLARY
GLUCOSE-CAPILLARY: 202 mg/dL — AB (ref 70–99)
GLUCOSE-CAPILLARY: 160 mg/dL — AB (ref 70–99)
Glucose-Capillary: 137 mg/dL — ABNORMAL HIGH (ref 70–99)
Glucose-Capillary: 226 mg/dL — ABNORMAL HIGH (ref 70–99)
Glucose-Capillary: 195 mg/dL — ABNORMAL HIGH (ref 70–99)

## 2014-03-01 LAB — HEMOGLOBIN A1C
Hgb A1c MFr Bld: 6.1 % — ABNORMAL HIGH (ref <5.7)
Mean Plasma Glucose: 128 mg/dL — ABNORMAL HIGH (ref <117)

## 2014-03-01 NOTE — Plan of Care (Signed)
Problem: Alteration in mood Goal: LTG-Patient reports reduction in suicidal thoughts (Patient reports reduction in suicidal thoughts and is able to verbalize a safety plan for whenever patient is feeling suicidal)  Outcome: Progressing Patient has not reported SI today or Monday when writer was patient's RN for the day. Patient appears to be less depressed.

## 2014-03-01 NOTE — Progress Notes (Signed)
Patient ID: Maureen Swanson, female   DOB: Oct 03, 1965, 48 y.o.   MRN: [id number]  D: Pt. Denies SI/HI and Auditory Hallucinations. Patient reports that at times she sees 'rottweillers.'Patient does report some pain in her legs, that may be from walking on floor without shoes. Patient is receiving PRN Ibuprofen and it appears to be helping patient. Support and encouragement provided to the patient to continued taking medications and eating a healthier diet in order to control her blood glucose. Scheduled medications administered to patient per physician's orders. Patient is receptive and cooperative but minimal with Probation officer. Patient is seen in the milieu and is attending groups. Q15 minute checks are maintained for safety.

## 2014-03-01 NOTE — BHH Group Notes (Signed)
[place] LCSW Group Therapy  03/01/2014 1:46 PM  Type of Therapy:  Group Therapy  Participation Level:  None  Participation Quality:  Drowsy  Affect:  Flat  Cognitive:  Appropriate  Insight:  Limited  Engagement in Therapy:  Limited  Modes of Intervention:  Socialization  Summary of Progress/Problems:[employer] ([place]) speaker came to talk about his personal journey with substance abuse and mental illness. Group members were challenged to process ways by which to relate to the speaker. [place] speaker provided handouts and educational information pertaining to groups and services offered by the [facility]. Pt slept during most of the presentation.     Hyatt,Candace 03/01/2014, 1:46 PM

## 2014-03-01 NOTE — Progress Notes (Signed)
Chaplain provided support with pt in 400 hall dayroom while rounding.  Bellamie elected to remain in dayroom rather than go outside to recreation.  Spoke with chaplain about upcoming discharge plans.  Hopeful to hear from SW tomorrow about options.  Bryttani is hoping to change her living situation and hopeful that this will help with substance abuse.  She expressed anxiety around possible new living environments.

## 2014-03-01 NOTE — BHH Group Notes (Signed)
[facility] LCSW Aftercare Discharge Planning Group Note   03/01/2014 10:47 AM  Participation Quality:  Engaged  Mood/Affect:  Flat  Depression Rating:  5  Anxiety Rating:  5  Thoughts of Suicide:  No Will you contract for safety?   NA  Current AVH:  No  Plan for Discharge/Comments:  Basilia states that she is still planning to go to [facility] to the shelter, and that takes precedence over [facility] as she knows she will not be admitted to that program.  She is willing to check out [place] meetings in [place] as her sobriety plan, as well as getting a sponsor.  She confirms that she is OK with d/c tomorrow since they have a bed, and she does not want to lose it to someone else.  Transportation Means: cab  Supports: none  [name], [place] B

## 2014-03-01 NOTE — Progress Notes (Signed)
Patient ID: Maureen Swanson, female   DOB: 08-01-1965, 48 y.o.   MRN: [id number] [facility] MD Progress Note  03/01/2014 10:49 AM Maureen Swanson  MRN:  [id number] Subjective:     Patient states "I'm not too good"   Objective:  48 year old female admitted  due to suicidal ideations and command hallucinations. Patient reports not feeling good today since she called two places yesterday and one of them cost 500$ a week and the other place will be available only on Friday. Patient denies any SI/AH/HI/VH today. Reports that she is anxious about placement ,otherwise doing OK.  Patient reports sleep as fair ,reports appetite as still reduced.  Per staff patient has periods of irritbality ,but has been improving.   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 She appears well-developed and well-nourished.  Neck: Normal range of motion.  Neurological: She is alert and oriented to person, place, and time.  Psychiatric: Her speech is normal. Her mood appears anxious (improving). Her affect is angry (improving) and labile. She is aggressive (improving). She is not actively hallucinating. Cognition and memory are normal. She expresses impulsivity. She expresses no homicidal and no suicidal ideation.    Review of Systems  Constitutional: Negative for fever and  chills.  Respiratory: Negative for cough and shortness of breath.   Psychiatric/Behavioral: Positive for depression and substance abuse. Negative for suicidal ideas and hallucinations. The patient is nervous/anxious.     Blood pressure 119/87, pulse 92, temperature 98.4 F (36.9 C), temperature source Oral, resp. rate 20, height 5\' 4"  (1.626 m), weight 91.627 kg (202 lb), SpO2 99.00%.Body mass index is 34.66 kg/(m^2).  General Appearance: Fairly Groomed  Engineer, water::  Fair  Speech:  Slow  Volume:  Normal  Mood:  Anxious and Depressed  Affect:  Blunt  Thought Process:  logical  Orientation:  Full (Time, Place, and Person)  Thought Content:  WDL  Suicidal Thoughts:  No   Homicidal Thoughts:  No  Memory:  Immediate;   Fair Recent;   Fair Remote;   Fair  Judgement:  Impaired  Insight:  Lacking  Psychomotor Activity:  Normal  Concentration:  Fair  Recall:  Good  Fund of Knowledge:Good  Language: Fair  Akathisia:  Negative  Handed:  Right  AIMS (if indicated):     Assets:  Desire for Improvement Resilience  Sleep:  Number of Hours: 6.75   Musculoskeletal: Strength & Muscle Tone: within normal limits Gait & Station: normal Patient leans: N/A  Current Medications: Current Facility-Administered Medications  Medication Dose Route Frequency Provider Last Rate Last Dose  . acetaminophen (TYLENOL) tablet 650 mg  650 mg Oral Q6H PRN Dara Hoyer, PA-C   650 mg at 02/28/14 0210  . albuterol (PROVENTIL HFA;VENTOLIN HFA) 108 (90 BASE) MCG/ACT inhaler 2 puff  2 puff Inhalation Q6H PRN Dara Hoyer, PA-C      . alum & mag hydroxide-simeth (MAALOX/MYLANTA)  200-200-20 MG/5ML suspension 30 mL  30 mL Oral Q4H PRN Dara Hoyer, PA-C      . atenolol (TENORMIN) tablet 100 mg  100 mg Oral Daily Dara Hoyer, PA-C   100 mg at 03/01/14 0825  . benztropine (COGENTIN) tablet 1 mg  1 mg Oral BID Dara Hoyer, PA-C   1 mg at 03/01/14 0825  . ciprofloxacin (CIPRO) tablet 500 mg  500 mg  Oral BID Dara Hoyer, PA-C   500 mg at 03/01/14 0825  . diphenhydrAMINE (BENADRYL) capsule 50 mg  50 mg Oral Q6H PRN [name], MD      . LORazepam (ATIVAN) tablet 1 mg  1 mg Oral Q6H PRN Benjamine Mola, FNP   1 mg at 02/27/14 2014   And  . haloperidol (HALDOL) tablet 5 mg  5 mg Oral Q6H PRN Benjamine Mola, FNP   5 mg at 02/23/14 1058  . hydrALAZINE (APRESOLINE) tablet 25 mg  25 mg Oral BID Benjamine Mola, FNP   25 mg at 03/01/14 2774  . hydrochlorothiazide (HYDRODIURIL) tablet 25 mg  25 mg Oral Daily Dara Hoyer, PA-C   25 mg at 03/01/14 0825  . hydrOXYzine (ATARAX/VISTARIL) tablet 25 mg  25 mg Oral Q6H PRN Dara Hoyer, PA-C   25 mg at 02/28/14 1611  . ibuprofen (ADVIL,MOTRIN) tablet 400 mg  400 mg Oral TID [name], MD   400 mg at 03/01/14 0824  . insulin aspart (novoLOG) injection 0-15 Units  0-15 Units Subcutaneous TID WC Encarnacion Slates, NP   2 Units at 03/01/14 [phone]  . lamoTRIgine (LAMICTAL) tablet 250 mg  250 mg Oral Daily [name], MD   250 mg at 03/01/14 0824  . loratadine (CLARITIN) tablet 10 mg  10 mg Oral Daily Dara Hoyer, PA-C   10 mg at 03/01/14 0825  . magnesium hydroxide (MILK OF MAGNESIA) suspension 30 mL  30 mL Oral Daily PRN Dara Hoyer, PA-C      . nicotine (NICODERM CQ - dosed in mg/24 hours) patch 21 mg  21 mg Transdermal Q0600 Dara Hoyer, PA-C   21 mg at 03/01/14 8676  . Oxcarbazepine (TRILEPTAL) tablet 600 mg  600 mg Oral BID [name], MD   600 mg at 03/01/14 0825  . pantoprazole (PROTONIX) EC tablet 40 mg  40 mg Oral QHS Dara Hoyer, PA-C   40 mg at 02/28/14 2158  . QUEtiapine (SEROQUEL XR) 24 hr tablet 800 mg  800 mg Oral QHS [name], MD   800 mg at 02/28/14 2158  . thiamine (VITAMIN B-1) tablet 100 mg  100 mg Oral Daily Dara Hoyer, PA-C   100 mg at 03/01/14 0825  . traZODone (DESYREL) tablet 100 mg  100 mg Oral QHS PRN [name], MD        Lab Results:  Results for orders placed during the hospital  encounter of 02/17/14 (from the past 48 hour(s))  GLUCOSE, CAPILL[facility]     Status: Abnormal   Collection Time    02/27/14 11:56 AM      Result Value Ref Range   Glucose-Capillary 175 (*) 70 - 99 mg/dL  GLUCOSE, CAPILLARY     Status: Abnormal   Collection Time    02/27/14  4:47 PM      Result Value Ref Range   Glucose-Capillary 151 (*) 70 - 99 mg/dL   Comment 1 Documented in Chart     Comment 2  Notify RN    GLUCOSE, CAPILLARY     Status: Abnormal   Collection Time    02/27/14  8:11 PM      Result Value Ref Range   Glucose-Capillary 211 (*) 70 - 99 mg/dL  GLUCOSE, CAPILLARY     Status: Abnormal   Collection Time    02/28/14  6:10 AM      Result Value Ref Range   Glucose-Capillary 139 (*) 70 - 99 mg/dL  GLUCOSE, CAPILLARY     Status: Abnormal   Collection Time    02/28/14 12:07 PM      Result Value Ref Range   Glucose-Capillary 270 (*) 70 - 99 mg/dL  GLUCOSE, CAPILLARY     Status: Abnormal   Collection Time    02/28/14  4:52 PM      Result Value Ref Range   Glucose-Capillary 273 (*) 70 - 99 mg/dL  HEMOGLOBIN A1C     Status: Abnormal   Collection Time    02/28/14  8:05 PM      Result Value Ref Range   Hemoglobin A1C 6.1 (*) <5.7 %   Comment: (NOTE)                                                                               According to the ADA Clinical Practice Recommendations for 2011, when     HbA1c is used as a screening test:      >=6.5%   Diagnostic of Diabetes Mellitus               (if abnormal result is confirmed)     5.7-6.4%   Increased risk of developing Diabetes Mellitus     References:Diagnosis and Classification of Diabetes Mellitus,Diabetes     ZDGU,4403,47(QQVZD 1):S62-S69 and Standards of Medical Care in             Diabetes - 2011,Diabetes Care,2011,34 (Suppl 1):S11-S61.   Mean Plasma Glucose 128 (*) <117 mg/dL   Comment: Performed at [place], [facility]     Status: Abnormal   Collection Time    02/28/14  8:55 PM      Result  Value Ref Range   Glucose-Capillary 186 (*) 70 - 99 mg/dL  GLUCOSE, CAPILLARY     Status: Abnormal   Collection Time    03/01/14  4:48 AM      Result Value Ref Range   Glucose-Capillary 137 (*) 70 - 99 mg/dL   Comment 1 Notify RN      Physical Findings: AIMS: Facial and Oral Movements Muscles of Facial Expression: None, normal Lips and Perioral Area: None, normal Jaw: None, normal Tongue: None, normal,Extremity Movements Upper (arms, wrists, hands, fingers): None, normal Lower (legs, knees, ankles, toes): None, normal, Trunk Movements Neck, shoulders, hips: None, normal, Overall Severity Severity of abnormal movements (highest score from questions above): None, normal Incapacitation due to abnormal movements: None, normal Patient's awareness of abnormal movements (rate only patient's report): No Awareness, Dental Status Current problems with teeth and/or dentures?: No Does patient usually wear dentures?: No  CIWA:  CIWA-Ar Total: 0 COWS:  COWS Total Score: 1       Treatment Plan Summary: Daily contact  with patient to assess and evaluate symptoms and progress in treatment Medication management See below  Assessment and Plan: Continue inpatient treatment. Patient reports improvement in her mood as well as HI. Will DC 1;1 since patient denies HI/SI. Continue Lamictal  250  mg QDAY for mood lability. Continue Seroquel XR  800 mg po qhs. Continue Trileptal 600 mg BID Continue HCTZ 25 mg po daily ,Hydralazine 25 mg po tid for HTN. Patient seen by Diabetic coordinator ,recommendations were placed per consult plan. Hemoglobin A1c -  Ref. Range 02/28/2014 20:05  Hemoglobin A1C Latest Range: <5.7 % 6.1 (H)   Continue Hydroxyzine prn for anxiety. CSW will work on disposition. Patient to be discharged to shelter ,once stable.   Medical Decision Making Problem Points:  Established problem, stable/improving (1), Review of last therapy session (1) and Review of psycho-social  stressors (1) Data Points:  Review or order clinical lab tests (1) Review or order medicine tests (1) Review of medication regiment & side effects (2)  I certify that inpatient services furnished can reasonably be expected to improve the patient's condition.   Esteban Kobashigawa,MD 03/01/2014, 10:49 AM

## 2014-03-01 NOTE — Progress Notes (Signed)
Adult Psychoeducational Group Note  Date:  03/01/2014 Time:  9:11 PM  Group Topic/Focus:  Wrap-Up Group:   The focus of this group is to help patients review their daily goal of treatment and discuss progress on daily workbooks.  Participation Level:  Active  Participation Quality:  Appropriate  Affect:  Appropriate  Cognitive:  Appropriate  Insight: Appropriate  Engagement in Group:  Engaged  Modes of Intervention:  Discussion  Additional Comments: The patient expressed her concerns about her medications.The patient also said that she feels better with the changes in her medications.  Nash Shearer 03/01/2014, 9:11 PM

## 2014-03-02 LAB — GLUCOSE, CAPILLARY
GLUCOSE-CAPILLARY: 121 mg/dL — AB (ref 70–99)
Glucose-Capillary: 160 mg/dL — ABNORMAL HIGH (ref 70–99)

## 2014-03-02 MED ORDER — BENZTROPINE MESYLATE 1 MG PO TABS: 1.0000 mg | ORAL_TABLET | Freq: Two times a day (BID) | ORAL | Status: DC

## 2014-03-02 MED ORDER — LORATADINE 10 MG PO TABS: 10.0000 mg | ORAL_TABLET | Freq: Every day | ORAL | Status: DC

## 2014-03-02 MED ORDER — ALBUTEROL SULFATE HFA 108 (90 BASE) MCG/ACT IN AERS: 2.0000 | INHALATION_SPRAY | Freq: Four times a day (QID) | RESPIRATORY_TRACT | Status: DC | PRN

## 2014-03-02 MED ORDER — HYDROCHLOROTHIAZIDE 25 MG PO TABS: 25.0000 mg | ORAL_TABLET | Freq: Every day | ORAL | Status: DC

## 2014-03-02 MED ORDER — HYDRALAZINE HCL 25 MG PO TABS: 25.0000 mg | ORAL_TABLET | Freq: Three times a day (TID) | ORAL | Status: DC

## 2014-03-02 MED ORDER — LAMOTRIGINE 25 MG PO TABS: 250.0000 mg | ORAL_TABLET | Freq: Every day | ORAL | Status: DC

## 2014-03-02 MED ORDER — QUETIAPINE FUMARATE ER 400 MG PO TB24: 800.0000 mg | ORAL_TABLET | Freq: Every day | ORAL | Status: DC

## 2014-03-02 MED ORDER — TRAZODONE HCL 100 MG PO TABS: 100.0000 mg | ORAL_TABLET | Freq: Every evening | ORAL | Status: DC | PRN

## 2014-03-02 MED ORDER — PANTOPRAZOLE SODIUM 40 MG PO TBEC: 40.0000 mg | DELAYED_RELEASE_TABLET | Freq: Every day | ORAL | Status: DC

## 2014-03-02 MED ORDER — HYDROXYZINE HCL 25 MG PO TABS: 25.0000 mg | ORAL_TABLET | Freq: Four times a day (QID) | ORAL | Status: DC | PRN

## 2014-03-02 MED ORDER — OXCARBAZEPINE 600 MG PO TABS: 600.0000 mg | ORAL_TABLET | Freq: Two times a day (BID) | ORAL | Status: DC

## 2014-03-02 MED ORDER — ATENOLOL 100 MG PO TABS
100.0000 mg | ORAL_TABLET | Freq: Every day | ORAL | Status: DC
Start: 2014-03-02 — End: 2014-12-11
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
## 2014-03-02 NOTE — Discharge Summary (Signed)
Physician Discharge Summary Note  Patient:  Maureen Swanson is an 48 y.o., female MRN:  [id number] DOB:  1965/08/13 Patient phone:  [phone] (home)  Patient address:   [address] [address],  Total Time spent with patient: Greater than 30 minutes  Date of Admission:  02/17/2014 Date of Discharge: 03/02/14  Reason for Admission: Worsening symptoms of depression  Discharge Diagnoses: Active Problems:   Alcohol dependence   Depression   Depression with suicidal ideation   Psychiatric Specialty Exam: Physical Exam  Psychiatric: Her speech is normal and behavior is normal. Judgment and thought content normal. Her mood appears not anxious. Her affect is not angry, not blunt, not labile and not inappropriate. Cognition and memory are normal. She does not exhibit a depressed mood.    Review of Systems  Constitutional: Negative.   HENT: Negative.   Eyes: Negative.   Respiratory: Negative.   Cardiovascular: Negative.   Gastrointestinal: Negative.   Genitourinary: Negative.   Musculoskeletal: Negative.   Skin: Negative.   Neurological: Negative.  Loss of consciousness: Stabilized with medication prior to discharge.  Endo/Heme/Allergies: Negative.   Psychiatric/Behavioral: Positive for depression and substance abuse (Alcoholism, chronic). Negative for suicidal ideas, hallucinations and memory loss. The patient has insomnia (Stable).     Blood pressure 120/87, pulse 93, temperature 98.9 F (37.2 C), temperature source Oral, resp. rate 16, height _0  (1.626 m), weight 91.627 kg (202 lb), SpO2 99.00%.Body mass index is 34.66 kg/(m^2).   General Appearance: Casual   Eye Contact:: Good   Speech: Clear and Coherent   Volume: Normal   Mood: Euthymic   Affect: Congruent   Thought Process: Goal Directed   Orientation: Full (Time, Place, and Person)   Thought Content: WDL   Suicidal Thoughts: No   Homicidal Thoughts: No   Memory: Immediate; Good  Recent; Good   Remote; Good   Judgement: Intact   Insight: Fair   Psychomotor Activity: Normal   Concentration: Good   Recall: [place] of Knowledge:Fair   Language: Good   Akathisia: No     AIMS (if indicated): 0   Assets: Communication Skills   Sleep: Number of Hours: 6.25    Past Psychiatric History: Diagnosis: Alcohol dependence, Schizoaafective disorder  Hospitalizations: [facility] adult unit  Outpatient Care: [facility].  Substance Abuse Care: [facility]  Self-Mutilation: Hx of threats to cut wrists  Suicidal Attempts: NA  Violent Behaviors: NA   Musculoskeletal: Strength & Muscle Tone: within normal limits Gait & Station: normal Patient leans: N/A  DSM5:  Primary psychiatric diagnosis  Schizoaffective disorder,bipolar type ,multiple episodes ,currently in ( acute episode -resolved)    Secondary psychiatric diagnosis:  Alcohol use disorder  Alcohol withdrawal  Cocaine use disorder   Non psychiatric diagnosis;  GERD (gastroesophageal reflux disease)  Migraines  TMJ (temporomandibular joint disorder)  Asthma  Arthritis  Gastric ulcer per hx  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 High cholesterol   . Depression   . Gout   . Anxiety   . Bipolar 1 disorder   . GERD (gastroesophageal reflux disease)   . Substance abuse     crack cocaine  . Headache(784.0)     migraines  . TMJ (temporomandibular joint disorder)   . Asthma     daily and prn inhalers  . Arthritis     back and knees  .  Gastric ulcer   . 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 Hypertension     under control, has been on med. x 12 yrs.  . Diabetes mellitus     diet-controlled   Level of Care:  OP  [facility] Course: Pt is 48 year old woman with history of schizoaffective disorder who presents voluntarily, she states that she is suicidal with the intent to  cut her wrist, she states that she's very depressed and doesn't like her living arrangements. She lives with her boyfriend and he's controlling and she doesn't want to be there anymore.   This is one of numerous discharge summaries from this [facility] for Maureen Swanson. Maureen Swanson has chronic mental illness with history of polysubstance abuse and dependence and other chronic medical conditions. She was admitted to the [facility] this time around with her toxicology tests reports showing blood alcohol level of 75 and UDS testa reports positive for cocaine and her usual complaints of suicidal ideations with intent to cut her wrists. She was discharged recently from this [facility] on 01/31/14 to follow-up care on an outpatient basis at the [facility] [place] [facility] in [place], [place] for medication managment and [facility] in [place], [place] for further substance abuse treatment.  She was again seen, evaluated & discharged from the the [facility] on 02/18/14, then transferred to adult unit for further psychiatric treatment and medication adjustment. Apparently, Maureen Swanson did neither go to the [facility] nor the [facility] as were recomended.   During this [facility] stay, Maureen Swanson was then ordered, medicated and discharged on; Wellbutrin SR 200 mg twice daily for depression,Trazodone 100 mg Q bedtime for sleep, Lamictal 250 mg daily for mood stabilization, Trileptal 600 mg twice daily for mood stabilization, Seroquel 800 mg Q bedtime for mood control, Cogentin 1 mg twice daily for prevention of drug induced involuntary movement and Hydroxyzine 25 mg Q 6 hours as needed for anxiety . She also was enrolled in the group counseling sessions and activities where she was counseled and learned coping skills that should help her cope better and manage her symptoms effectively after discharge. Maureen Swanson was resumed & received other medication management and monitoring for her  other previously existing medical issues and concerns that she presented. She tolerated her treatment regimen without any significant adverse effects and or reactions presented.   Maureen Swanson has shown improved mood and her symptoms did respond positively to her treatment regimen. This is evidenced by her reports of improved mood, reduction of symptoms and presentation of good affect/eye contact. She met with her providers this am. Her reason for admission, response to treatment and discharge plans discussed. Maureen Swanson endorsed that her symptoms has stabilized and that she is ready for discharge to continue routine psychiatric treatment on an outpatient basis at the [facility] in [place], including medication management. She is provided with all the necessary information required to make this appointment without problems.  Upon discharge, Maureen Swanson adamantly denies any suicidal, homicidal ideations, auditory, visual hallucinations, paranoia, delusional thoughts and or substance withdrawal symptoms. She was provided with 14 days worth supply samples of her [place] [facility] discharge medications. She left [facility] with all personal belongings via personal arranged transport in no apparent distress.    Consults:  psychiatry  Significant Diagnostic Studies:  labs: CBC with diff, CMP, UDS, toxicology tests, U/ reports reviewed, stable  Discharge Vitals:   Blood pressure 120/87, pulse 93, temperature 98.9  F (37.2 C), temperature source Oral, resp. rate 16, height _0  (1.626 m), weight 91.627 kg (202 lb), SpO2 99.00%. Body mass index is 34.66 kg/(m^2). Lab Results:   Results for orders placed during the [facility] encounter of 02/17/14 (from the past 72 hour(s))  GLUCOSE, CAPILLARY     Status: Abnormal   Collection Time    02/28/14  4:52 PM      Result Value Ref Range   Glucose-Capillary 273 (*) 70 - 99 mg/dL  HEMOGLOBIN A1C     Status: Abnormal   Collection Time    02/28/14  8:05 PM      Result Value Ref Range    Hemoglobin A1C 6.1 (*) <5.7 %   Comment: (NOTE)                                                                               According to the ADA Clinical Practice Recommendations for 2011, when     HbA1c is used as a screening test:      >=6.5%   Diagnostic of Diabetes Mellitus               (if abnormal result is confirmed)     5.7-6.4%   Increased risk of developing Diabetes Mellitus     References:Diagnosis and Classification of Diabetes Mellitus,Diabetes     KQAS,6015,61(BPPHK 1):S62-S69 and Standards of Medical Care in             Diabetes - 2011,Diabetes Care,2011,34 (Suppl 1):S11-S61.   Mean Plasma Glucose 128 (*) <117 mg/dL   Comment: Performed at [place], CAPILLARY     Status: Abnormal   Collection Time    02/28/14  8:55 PM      Result Value Ref Range   Glucose-Capillary 186 (*) 70 - 99 mg/dL  GLUCOSE, CAPILLARY     Status: Abnormal   Collection Time    03/01/14  4:48 AM      Result Value Ref Range   Glucose-Capillary 137 (*) 70 - 99 mg/dL   Comment 1 Notify RN    GLUCOSE, CAPILLARY     Status: Abnormal   Collection Time    03/01/14 11:44 AM      Result Value Ref Range   Glucose-Capillary 202 (*) 70 - 99 mg/dL  GLUCOSE, CAPILLARY     Status: Abnormal   Collection Time    03/01/14  1:09 PM      Result Value Ref Range   Glucose-Capillary 160 (*) 70 - 99 mg/dL  GLUCOSE, CAPILLARY     Status: Abnormal   Collection Time    03/01/14  4:45 PM      Result Value Ref Range   Glucose-Capillary 226 (*) 70 - 99 mg/dL  GLUCOSE, CAPILLARY     Status: Abnormal   Collection Time    03/01/14  8:57 PM      Result Value Ref Range   Glucose-Capillary 195 (*) 70 - 99 mg/dL  GLUCOSE, CAPILLARY     Status: Abnormal   Collection Time    03/02/14  6:17 AM      Result Value Ref Range   Glucose-Capillary 121 (*) 70 - 99 mg/dL  GLUCOSE, CAPILLARY     Status: Abnormal   Collection Time    03/02/14 11:37 AM      Result Value Ref Range   Glucose-Capillary 160 (*)  70 - 99 mg/dL   Comment 1 Documented in Chart     Comment 2 Notify RN      Physical Findings: AIMS: Facial and Oral Movements Muscles of Facial Expression: None, normal Lips and Perioral Area: None, normal Jaw: None, normal Tongue: None, normal,Extremity Movements Upper (arms, wrists, hands, fingers): None, normal Lower (legs, knees, ankles, toes): None, normal, Trunk Movements Neck, shoulders, hips: None, normal, Overall Severity Severity of abnormal movements (highest score from questions above): None, normal Incapacitation due to abnormal movements: None, normal Patient's awareness of abnormal movements (rate only patient's report): No Awareness, Dental Status Current problems with teeth and/or dentures?: No Does patient usually wear dentures?: No  CIWA:  CIWA-Ar Total: 0 COWS:  COWS Total Score: 1  Psychiatric Specialty Exam: See Psychiatric Specialty Exam and Suicide Risk Assessment completed by Attending Physician prior to discharge.  Discharge destination:  Home  Is patient on multiple antipsychotic therapies at discharge:  No   Has Patient had three or more failed trials of antipsychotic monotherapy by history:  No  Recommended Plan for Multiple Antipsychotic Therapies: NA    Medication List    STOP taking these medications       risperiDONE 2 MG tablet  Commonly known as:  RISPERDAL      TAKE these medications     Indication   albuterol 108 (90 BASE) MCG/ACT inhaler  Commonly known as:  PROVENTIL HFA;VENTOLIN HFA  Inhale 2 puffs into the lungs every 6 (six) hours as needed for wheezing or shortness of breath.   Indication:  Asthma, SOB     atenolol 100 MG tablet  Commonly known as:  TENORMIN  Take 1 tablet (100 mg total) by mouth daily. For hypertension   Indication:  High Blood Pressure     benztropine 1 MG tablet  Commonly known as:  COGENTIN  Take 1 tablet (1 mg total) by mouth 2 (two) times daily. For prevention of drug induced involuntary movement    Indication:  Extrapyramidal Reaction caused by Medications     buPROPion 200 MG 12 hr tablet  Commonly known as:  WELLBUTRIN SR  Take 1 tablet (200 mg total) by mouth 2 (two) times daily. For depression   Indication:  Major Depressive Disorder     hydrALAZINE 25 MG tablet  Commonly known as:  APRESOLINE  Take 1 tablet (25 mg total) by mouth 3 (three) times daily. For hypertension   Indication:  High Blood Pressure     hydrochlorothiazide 25 MG tablet  Commonly known as:  HYDRODIURIL  Take 1 tablet (25 mg total) by mouth daily. For hypertension   Indication:  High Blood Pressure     hydrOXYzine 25 MG tablet  Commonly known as:  ATARAX/VISTARIL  Take 1 tablet (25 mg total) by mouth every 6 (six) hours as needed for anxiety.   Indication:  Tension, Anxiety     lamoTRIgine 25 MG tablet  Commonly known as:  LAMICTAL  Take 10 tablets (250 mg total) by mouth daily. For mood stabilization   Indication:  Mood stabilization     loratadine 10 MG tablet  Commonly known as:  CLARITIN  Take 1 tablet (10 mg total) by mouth daily. (May be purchased from over the counter): For allergies   Indication:  Perennial Rhinitis, Hayfever  oxcarbazepine 600 MG tablet  Commonly known as:  TRILEPTAL  Take 1 tablet (600 mg total) by mouth 2 (two) times daily. For mood stabilization   Indication:  Mood stabilization     pantoprazole 40 MG tablet  Commonly known as:  PROTONIX  Take 1 tablet (40 mg total) by mouth at bedtime. For acid reflux   Indication:  Gastroesophageal Reflux Disease     QUEtiapine 400 MG 24 hr tablet  Commonly known as:  SEROQUEL XR  Take 2 tablets (800 mg total) by mouth at bedtime. For mood control   Indication:  Mood control     traZODone 100 MG tablet  Commonly known as:  DESYREL  Take 1 tablet (100 mg total) by mouth at bedtime as needed for sleep.   Indication:  Trouble Sleeping       Follow-up Information   Follow up with [facility]. (For the first appointment, you  must go to the walk in clinic. Monday- Friday 8:30-3:00. )    Contact information:   [phone]     Follow-up recommendations:  Activity:  As tolerated Diet: As recommended by your primary care doctor. Keep all scheduled follow-up appointments as recommended.  Comments:  Take all your medications as prescribed by your mental healthcare provider. Report any adverse effects and or reactions from your medicines to your outpatient provider promptly. Patient is instructed and cautioned to not engage in alcohol and or illegal drug use while on prescription medicines. In the event of worsening symptoms, patient is instructed to call the crisis hotline, 911 and or go to the nearest ED for appropriate evaluation and treatment of symptoms. Follow-up with your primary care provider for your other medical issues, concerns and or health care needs.   Total Discharge Time:  Greater than 30 minutes.  SignedEncarnacion Slates, PMHNP-BC 03/03/2014, 1:02 PM  Patient was seen face to face for psychiatric evaluation, suicide risk assessment and case discussed with treatment team and NP and made appropriate disposition plans. Reviewed the information documented and agree with the treatment plan.    [name] ,MD Attending [place] [facility]

## 2014-03-02 NOTE — Plan of Care (Signed)
Problem: Diagnosis: Increased Risk For Suicide Attempt Goal: LTG-Patient Will Report Improved Mood and Deny Suicidal LTG (by discharge) Patient will report improved mood and deny suicidal ideation.  Outcome: Completed/Met Date Met:  03/02/14 Patient has denied SI to writer today and yesterday. Patient reports that she is no longer feeling suicidal.  Problem: Alteration in mood Goal: STG-Pt Able to Identify Plan For Continuing Care at D/C Pt. Will be able to identify a plan for continuing care at discharge  Outcome: Completed/Met Date Met:  03/02/14 Patient is aware of her discharge and after care plans.

## 2014-03-02 NOTE — [facility] Group Notes (Signed)
[facility] Group Notes:  (Nursing/MHT/Case Management/Adjunct)  Date:  03/02/2014  Time:  10:50 AM  Type of Therapy:  Nurse Education  Participation Level:  Minimal  Participation Quality:  Appropriate  Affect:  Blunted  Cognitive:  Alert  Insight:  None  Engagement in Group:  Lacking  Modes of Intervention:  Discussion  Summary of Progress/Problems: Today's group focused on self care, leisure, and lifestyle changes. Patient filled out self care information sheet. Patient did not want to discuss her self care answers. Patient was more focused on discharge.  Olimpia Tinch E 03/02/2014, 10:50 AM

## 2014-03-02 NOTE — Tx Team (Signed)
  Interdisciplinary Treatment Plan Update   Date Reviewed:  03/02/2014  Time Reviewed:  10:03 AM  Progress in Treatment:   Attending groups: Yes Participating in groups: Yes Taking medication as prescribed: Yes  Tolerating medication: Yes Family/Significant other contact made: Yes  Patient understands diagnosis: Yes  Discussing patient identified problems/goals with staff: Yes  See initial care plan Medical problems stabilized or resolved: Yes Denies suicidal/homicidal ideation: Yes  In tx team Patient has not harmed self or others: Yes  For review of initial/current patient goals, please see plan of care.  Estimated Length of Stay:  D/C today  Reason for Continuation of Hospitalization:   New Problems/Goals identified:  N/A  Discharge Plan or Barriers:   [facility] [place], [place]  Additional Comments:  Attendees:  Signature: Steva Colder, MD 03/02/2014 10:03 AM   Signature: Ripley Fraise, LCSW 03/02/2014 10:03 AM  Signature:  03/02/2014 10:03 AM  Signature: Gaylan Gerold, RN 03/02/2014 10:03 AM  Signature: 03/02/2014 10:03 AM  Signature:  03/02/2014 10:03 AM  Signature:   03/02/2014 10:03 AM  Signature:    Signature:    Signature:    Signature:    Signature:    Signature:      Scribe for Treatment Team:   [employer], [place]  03/02/2014 10:03 AM

## 2014-03-02 NOTE — Progress Notes (Signed)
Patient ID: Maureen Swanson, female   DOB: [birth year], 48 y.o.   MRN: [id number]  Pt. Denies SI/HI and A/V hallucinations to this Probation officer today. Belongings returned to patient at time of discharge. Patient denies any pain or discomfort. Discharge instructions and medications were reviewed with patient. Patient verbalized understanding of both medications and discharge instructions. Patient was discharged without distress noted. Q15 minute safety checks maintained until discharge.

## 2014-03-02 NOTE — Progress Notes (Signed)
   D: Pt was more appeared to be more cheerful today than previous days, but still had a depressed mood. Pt informed the writer that she was informed that her discharge would be either to [facility] in [place] or a place in [place]. When asked what would determine which place, pt stated, "which ever I don't have to pay for".   A:  Support and encouragement was offered. 15 min checks continued for safety.  R: Pt remains safe.

## 2014-03-06 NOTE — Progress Notes (Addendum)
Patient Discharge Instructions:  After Visit Summary (AVS):   Faxed to:  03/06/14 Discharge Summary Note:   Faxed to:  03/06/14 Psychiatric Admission Assessment Note:   Faxed to:  03/06/14 Suicide Risk Assessment - Discharge Assessment:   Faxed to:  03/06/14 Faxed/Sent to the Next Level Care provider:  03/06/14 Faxed to [facility], 03/06/2014, 1:54 PM

## 2014-06-08 ENCOUNTER — Emergency Department (HOSPITAL_COMMUNITY): Payer: Self-pay

## 2014-06-08 ENCOUNTER — Encounter (HOSPITAL_COMMUNITY): Payer: Self-pay | Admitting: Emergency Medicine

## 2014-06-08 ENCOUNTER — Emergency Department (HOSPITAL_COMMUNITY)
Admission: EM | Admit: 2014-06-08 | Discharge: 2014-06-08 | Disposition: A | Discharge status: Home / Self Care | Payer: Self-pay | Attending: Emergency Medicine | Admitting: Emergency Medicine

## 2014-06-08 DIAGNOSIS — J45909 Unspecified asthma, uncomplicated: Secondary | ICD-10-CM | POA: Insufficient documentation

## 2014-06-08 DIAGNOSIS — F319 Bipolar disorder, unspecified: Secondary | ICD-10-CM | POA: Insufficient documentation

## 2014-06-08 DIAGNOSIS — R1032 Left lower quadrant pain: Secondary | ICD-10-CM

## 2014-06-08 DIAGNOSIS — E119 Type 2 diabetes mellitus without complications: Secondary | ICD-10-CM | POA: Insufficient documentation

## 2014-06-08 DIAGNOSIS — K219 Gastro-esophageal reflux disease without esophagitis: Secondary | ICD-10-CM | POA: Insufficient documentation

## 2014-06-08 DIAGNOSIS — Z87891 Personal history of nicotine dependence: Secondary | ICD-10-CM | POA: Insufficient documentation

## 2014-06-08 DIAGNOSIS — G43909 Migraine, unspecified, not intractable, without status migrainosus: Secondary | ICD-10-CM | POA: Insufficient documentation

## 2014-06-08 DIAGNOSIS — M1990 Unspecified osteoarthritis, unspecified site: Secondary | ICD-10-CM | POA: Insufficient documentation

## 2014-06-08 DIAGNOSIS — I10 Essential (primary) hypertension: Secondary | ICD-10-CM | POA: Insufficient documentation

## 2014-06-08 DIAGNOSIS — Z79899 Other long term (current) drug therapy: Secondary | ICD-10-CM | POA: Insufficient documentation

## 2014-06-08 DIAGNOSIS — R1084 Generalized abdominal pain: Secondary | ICD-10-CM | POA: Insufficient documentation

## 2014-06-08 DIAGNOSIS — E669 Obesity, unspecified: Secondary | ICD-10-CM | POA: Insufficient documentation

## 2014-06-08 DIAGNOSIS — Z3202 Encounter for pregnancy test, result negative: Secondary | ICD-10-CM | POA: Insufficient documentation

## 2014-06-08 DIAGNOSIS — F419 Anxiety disorder, unspecified: Secondary | ICD-10-CM | POA: Insufficient documentation

## 2014-06-08 DIAGNOSIS — K5900 Constipation, unspecified: Secondary | ICD-10-CM | POA: Insufficient documentation

## 2014-06-08 LAB — URINALYSIS, ROUTINE W REFLEX MICROSCOPIC
Bilirubin Urine: NEGATIVE
Glucose, UA: NEGATIVE mg/dL
Hgb urine dipstick: NEGATIVE
Ketones, ur: NEGATIVE mg/dL
Nitrite: NEGATIVE
Protein, ur: NEGATIVE mg/dL
Specific Gravity, Urine: 1.021 (ref 1.005–1.030)
Urobilinogen, UA: 0.2 mg/dL (ref 0.0–1.0)
pH: 5.5 (ref 5.0–8.0)

## 2014-06-08 LAB — CBC WITH DIFFERENTIAL/PLATELET
Basophils Absolute: 0 10*3/uL (ref 0.0–0.1)
Basophils Relative: 0 % (ref 0–1)
Eosinophils Absolute: 0.1 10*3/uL (ref 0.0–0.7)
Eosinophils Relative: 1 % (ref 0–5)
HEMATOCRIT: 34.4 % — AB (ref 36.0–46.0)
HEMOGLOBIN: 10.9 g/dL — AB (ref 12.0–15.0)
LYMPHS PCT: 10 % — AB (ref 12–46)
Lymphs Abs: 0.8 10*3/uL (ref 0.7–4.0)
MCH: 27.9 pg (ref 26.0–34.0)
MCHC: 31.7 g/dL (ref 30.0–36.0)
MCV: 88 fL (ref 78.0–100.0)
MONO ABS: 0.5 10*3/uL (ref 0.1–1.0)
MONOS PCT: 6 % (ref 3–12)
Neutro Abs: 6.7 10*3/uL (ref 1.7–7.7)
Neutrophils Relative %: 83 % — ABNORMAL HIGH (ref 43–77)
Platelets: 165 10*3/uL (ref 150–400)
RBC: 3.91 MIL/uL (ref 3.87–5.11)
RDW: 13.7 % (ref 11.5–15.5)
WBC: 8.1 10*3/uL (ref 4.0–10.5)

## 2014-06-08 LAB — LIPASE, BLOOD: LIPASE: 16 U/L (ref 11–59)

## 2014-06-08 LAB — COMPREHENSIVE METABOLIC PANEL WITH GFR
ALT: 14 U/L (ref 0–35)
AST: 23 U/L (ref 0–37)
Albumin: 3.5 g/dL (ref 3.5–5.2)
Alkaline Phosphatase: 73 U/L (ref 39–117)
Anion gap: 8 (ref 5–15)
BUN: 22 mg/dL (ref 6–23)
CO2: 21 mmol/L (ref 19–32)
Calcium: 9 mg/dL (ref 8.4–10.5)
Chloride: 110 meq/L (ref 96–112)
Creatinine, Ser: 0.9 mg/dL (ref 0.50–1.10)
GFR calc Af Amer: 86 mL/min — ABNORMAL LOW
GFR calc non Af Amer: 74 mL/min — ABNORMAL LOW
Glucose, Bld: 144 mg/dL — ABNORMAL HIGH (ref 70–99)
Potassium: 4.2 mmol/L (ref 3.5–5.1)
Sodium: 139 mmol/L (ref 135–145)
Total Bilirubin: 0.6 mg/dL (ref 0.3–1.2)
Total Protein: 6.7 g/dL (ref 6.0–8.3)

## 2014-06-08 LAB — URINE MICROSCOPIC-ADD ON

## 2014-06-08 LAB — POC URINE PREG, ED: Preg Test, Ur: NEGATIVE

## 2014-06-08 MED ORDER — POLYETHYLENE GLYCOL 3350 17 GM/SCOOP PO POWD: 17.0000 g | Freq: Two times a day (BID) | ORAL | Status: DC

## 2014-06-08 MED ORDER — MORPHINE SULFATE 4 MG/ML IJ SOLN
4.0000 mg | Freq: Once | INTRAMUSCULAR | Status: AC
  Administered 2014-06-08: 4 mg via INTRAVENOUS
  Filled 2014-06-08: qty 1

## 2014-06-08 NOTE — ED Notes (Addendum)
Pt report constipation and severe left side abdominal pain radiating to back since Tuesday.

## 2014-06-08 NOTE — Discharge Instructions (Signed)
Abdominal Pain Many things can cause abdominal pain. Usually, abdominal pain is not caused by a disease and will improve without treatment. It can often be observed and treated at home. Your health care provider will do a physical exam and possibly order blood tests and X-rays to help determine the seriousness of your pain. However, in many cases, more time must pass before a clear cause of the pain can be found. Before that point, your health care provider may not know if you need more testing or further treatment. HOME CARE INSTRUCTIONS  Monitor your abdominal pain for any changes. The following actions may help to alleviate any discomfort you are experiencing:  Only take over-the-counter or prescription medicines as directed by your health care provider.  Do not take laxatives unless directed to do so by your health care provider.  Try a clear liquid diet (broth, tea, or water) as directed by your health care provider. Slowly move to a bland diet as tolerated. SEEK MEDICAL CARE IF:  You have unexplained abdominal pain.  You have abdominal pain associated with nausea or diarrhea.  You have pain when you urinate or have a bowel movement.  You experience abdominal pain that wakes you in the night.  You have abdominal pain that is worsened or improved by eating food.  You have abdominal pain that is worsened with eating fatty foods.  You have a fever. SEEK IMMEDIATE MEDICAL CARE IF:   Your pain does not go away within 2 hours.  You keep throwing up (vomiting).  Your pain is felt only in portions of the abdomen, such as the right side or the left lower portion of the abdomen.  You pass bloody or black tarry stools. MAKE SURE YOU:  Understand these instructions.   Will watch your condition.   Will get help right away if you are not doing well or get worse.  Document Released: 03/12/2005 Document Revised: 06/07/2013 Document Reviewed: 02/09/2013 [facility] Patient Information  2015 [place], [place]. This information is not intended to replace advice given to you by your health care provider. Make sure you discuss any questions you have with your health care provider.   You were evaluated in the ED today for abdominal pain. Your x-rays revealed a large stool burden in her colon. He will be given Miralax to take to help you with having a bowel movement. It is important for you to follow up with primary care for further evaluation and management of your symptoms. Return to ED for worsening symptoms, fevers, chest pain, shortness of breath, worsening abdominal pain.

## 2014-06-08 NOTE — ED Notes (Signed)
Bed: WA03 Expected date:  Expected time:  Means of arrival:  Comments: EMS- constipation x 2 days

## 2014-06-08 NOTE — ED Provider Notes (Signed)
CSN: [id number]     Arrival date & time 06/08/14  1022 History   First MD Initiated Contact with Patient 06/08/14 1032     Chief Complaint  Patient presents with  . Abdominal Pain  . Constipation   Patient bathroom 1045  (Consider location/radiation/quality/duration/timing/severity/associated sxs/prior Treatment) HPI Maureen Swanson is a 48 y.o. female with a history of hypertension, high cholesterol, diabetes, GERD, bipolar 1 and anxiety comes in for evaluation of constipation and left sided abdominal pain. Patient states 2 days ago she began to experience left-sided abdominal pain that she characterizes as a pressure squeezing sensation that is worse when she walks. She reports it as a 10 out of 10 pain at this time with no relieving factors. She denies any fevers, nausea or vomiting. She reports her last bowel movement was on Sunday" it was diarrhea". She denies any bloody stools. Also reports that she "drinks a 40 every other day". No previous abdominal surgeries.  She reports she has an appointment with her cardiologist next week as well as her primary care.  Past Medical History  Diagnosis Date  . High cholesterol   . Depression   . Gout   . Anxiety   . Bipolar 1 disorder   . GERD (gastroesophageal reflux disease)   . Substance abuse     crack cocaine  . Headache(784.0)     migraines  . TMJ (temporomandibular joint disorder)   . Asthma     daily and prn inhalers  . Arthritis     back and knees  . Gastric ulcer   . 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 Hypertension     under control, has been on med. x 12 yrs.  . Diabetes mellitus     diet-controlled   Past Surgical History  Procedure Laterality Date  . Knee arthroscopy w/ partial medial meniscectomy  05/01/2010    right  . Breast lumpectomy with needle localization  04/19/2012    Procedure: BREAST LUMPECTOMY WITH NEEDLE LOCALIZATION;  Surgeon: [name], MD;  Location: [facility];  Service: General;  Laterality: Right;   Family History  Problem Relation Age of Onset  . Diabetes Mother   . Cancer Mother     breast  . Cancer Father    History  Substance Use Topics  . Smoking status: Former Smoker -- 15 years    Quit date: 08/02/2012  . Smokeless tobacco: Never Used     Comment: quit smoking 02/2012  . Alcohol Use: No     Comment: no longer drinking   OB History    No data available     Review of Systems  Constitutional: Negative for fever.  HENT: Negative for sore throat.   Eyes: Negative for visual disturbance.  Respiratory: Negative for shortness of breath.   Cardiovascular: Negative for chest pain.  Gastrointestinal: Positive for abdominal pain and constipation.  Endocrine: Negative for polyuria.  Genitourinary: Negative for dysuria.  Skin: Negative for rash.  Neurological: Negative for headaches.      Allergies  Chocolate; Orange; Penicillins; and Tomato  Home Medications   Prior to Admission medications   Medication Sig Start Date End Date Taking? Authorizing Provider  atenolol (TENORMIN) 100 MG tablet Take 1 tablet (100 mg total) by mouth daily. For hypertension 03/02/14  Yes Encarnacion Slates, NP  Calcium 500 MG tablet Take 500 mg by mouth daily.   Yes Historical Provider, MD  cetirizine (ZYRTEC)  10 MG tablet Take 10 mg by mouth daily.   Yes Historical Provider, MD  Diclofenac Sodium CR 100 MG 24 hr tablet Take 100 mg by mouth 2 (two) times daily as needed for pain.   Yes Historical Provider, MD  hydrochlorothiazide (MICROZIDE) 12.5 MG capsule Take 12.5 mg by mouth daily with breakfast.   Yes Historical Provider, MD  omeprazole (PRILOSEC) 20 MG capsule Take 20 mg by mouth daily.   Yes Historical Provider, MD  OXcarbazepine (TRILEPTAL) 150 MG tablet Take 150 mg by mouth 2 (two) times daily.   Yes Historical Provider, MD  QUEtiapine (SEROQUEL XR) 400 MG 24 hr tablet Take 2 tablets (800 mg total) by mouth at bedtime. For mood  control Patient taking differently: Take 400 mg by mouth at bedtime. For mood control 03/02/14  Yes Encarnacion Slates, NP  albuterol (PROVENTIL HFA;VENTOLIN HFA) 108 (90 BASE) MCG/ACT inhaler Inhale 2 puffs into the lungs every 6 (six) hours as needed for wheezing or shortness of breath. 03/02/14   Encarnacion Slates, NP  benztropine (COGENTIN) 1 MG tablet Take 1 tablet (1 mg total) by mouth 2 (two) times daily. For prevention of drug induced involuntary movement 03/02/14   Encarnacion Slates, NP  buPROPion [facility] SR) 200 MG 12 hr tablet Take 1 tablet (200 mg total) by mouth 2 (two) times daily. For depression 01/31/13   Encarnacion Slates, NP  hydrALAZINE (APRESOLINE) 25 MG tablet Take 1 tablet (25 mg total) by mouth 3 (three) times daily. For hypertension 03/02/14   Encarnacion Slates, NP  hydrochlorothiazide (HYDRODIURIL) 25 MG tablet Take 1 tablet (25 mg total) by mouth daily. For hypertension Patient not taking: Reported on 06/08/2014 03/02/14   Encarnacion Slates, NP  hydrOXYzine (ATARAX/VISTARIL) 25 MG tablet Take 1 tablet (25 mg total) by mouth every 6 (six) hours as needed for anxiety. 03/02/14   Encarnacion Slates, NP  lamoTRIgine (LAMICTAL) 25 MG tablet Take 10 tablets (250 mg total) by mouth daily. For mood stabilization 03/02/14   Encarnacion Slates, NP  loratadine (CLARITIN) 10 MG tablet Take 1 tablet (10 mg total) by mouth daily. (May be purchased from over the counter): For allergies 03/02/14   Encarnacion Slates, NP  Oxcarbazepine (TRILEPTAL) 600 MG tablet Take 1 tablet (600 mg total) by mouth 2 (two) times daily. For mood stabilization 03/02/14   Encarnacion Slates, NP  pantoprazole (PROTONIX) 40 MG tablet Take 1 tablet (40 mg total) by mouth at bedtime. For acid reflux 03/02/14   Encarnacion Slates, NP  polyethylene glycol powder (GLYCOLAX/MIRALAX) powder Take 17 g by mouth 2 (two) times daily. Until daily soft stools  OTC 06/08/14   Viona Gilmore Tamica Covell, PA-C  traZODone (DESYREL) 100 MG tablet Take 1 tablet (100 mg total) by mouth at bedtime  as needed for sleep. 03/02/14   Encarnacion Slates, NP   BP 129/68 mmHg  Pulse 81  Temp(Src) 98.4 F (36.9 C) (Oral)  Resp 18  SpO2 96% Physical Exam  Constitutional: She is oriented to person, place, and time. She appears well-developed and well-nourished.  Obese  HENT:  Head: Normocephalic and atraumatic.  Mouth/Throat: Oropharynx is clear and moist.  Eyes: Conjunctivae are normal. Pupils are equal, round, and reactive to light. Right eye exhibits no discharge. Left eye exhibits no discharge. No scleral icterus.  Neck: Neck supple.  Cardiovascular: Normal rate, regular rhythm and normal heart sounds.   Pulmonary/Chest: Effort normal and breath sounds normal. No respiratory distress.  She has no wheezes. She has no rales.  Abdominal: Soft. There is no tenderness.  Diffuse tenderness throughout abdomen. Tenderness in right lower quadrant. No distention, guarding or masses appreciated. No other lesions or deformities evident  Musculoskeletal: She exhibits no tenderness.  Neurological: She is alert and oriented to person, place, and time.  Cranial Nerves II-XII grossly intact  Skin: Skin is warm and dry. No rash noted.  Psychiatric: She has a normal mood and affect.  Nursing note and vitals reviewed.   ED Course  Procedures (including critical care time) Labs Review Labs Reviewed  CBC WITH DIFFERENTIAL - Abnormal; Notable for the following:    Hemoglobin 10.9 (*)    HCT 34.4 (*)    Neutrophils Relative % 83 (*)    Lymphocytes Relative 10 (*)    All other components within normal limits  COMPREHENSIVE METABOLIC PANEL - Abnormal; Notable for the following:    Glucose, Bld 144 (*)    GFR calc non Af Amer 74 (*)    GFR calc Af Amer 86 (*)    All other components within normal limits  URINALYSIS, ROUTINE W REFLEX MICROSCOPIC - Abnormal; Notable for the following:    Leukocytes, UA SMALL (*)    All other components within normal limits  LIPASE, BLOOD  URINE MICROSCOPIC-ADD ON  POC  URINE PREG, ED    Imaging Review Dg Abd Acute W/chest  06/08/2014   CLINICAL DATA:  Abdominal pain, constipation  EXAM: ACUTE ABDOMEN SERIES (ABDOMEN 2 VIEW & CHEST 1 VIEW)  COMPARISON:  09/25/2011  FINDINGS: Cardiomediastinal silhouette is unremarkable. No acute infiltrate or pleural effusion. No pulmonary edema.  Moderate stool is noted in right colon. Some gas noted in transverse colon. Gas and liquid stool noted in descending colon. Nonspecific nonobstructive small bowel gas pattern. No free abdominal air.  IMPRESSION: No acute disease within chest. Nonspecific nonobstructive bowel gas pattern. Moderate stool noted in right colon. Some gas noted in transverse colon. Moderate gas and liquid stool noted in left colon.   Electronically Signed   By: Lahoma Crocker M.D.   On: 06/08/2014 13:17     EKG Interpretation None     Meds given in ED:  Medications  morphine 4 MG/ML injection 4 mg (4 mg Intravenous Given 06/08/14 1329)    Discharge Medication List as of 06/08/2014  1:52 PM    START taking these medications   Details  polyethylene glycol powder (GLYCOLAX/MIRALAX) powder Take 17 g by mouth 2 (two) times daily. Until daily soft stools  OTC, Starting 06/08/2014, Until Discontinued, Print       Filed Vitals:   06/08/14 1022 06/08/14 1027 06/08/14 1402 06/08/14 1406  BP:  161/106 129/68 129/68  Pulse:  127 81   Temp:  98.4 F (36.9 C) 98.4 F (36.9 C)   TempSrc:  Oral Oral   Resp:  18 18   SpO2: 98% 96% 96%     MDM  Maureen Swanson is a 48 y.o. female with a history of hypertension, high cholesterol, diabetes, GERD, bipolar 1 and anxiety comes in for evaluation of constipation and left sided abdominal pain. Vitals stable - WNL -afebrile Pt resting comfortably in ED. Pain has improved in ED prior to analgesia. Watching TV in NAD. PE--no evidence of acute abdomen. Pt has diffuse tenderness without guarding, rebound.  Labwork noncontributory Imaging--DG abd shows diffuse moderate  stool burden w/o obstruction or impaction. No other acute intra-abdominal pathology.  Will DC with Miralax and instructions to  f/u with PCP. Discussed pattern of alcohol abuse and this may contribute to her abd pain. Low concern for emergent diverticulitis Discussed f/u with PCP and return precautions, pt very amenable to plan.  Prior to patient discharge, I discussed and reviewed this case with Dr.Allen     Final diagnoses:  Left lower quadrant pain       Verl Dicker, PA-C 06/08/14 1734  Leota Jacobsen, MD 06/09/14 [phone]

## 2014-07-02 ENCOUNTER — Encounter (HOSPITAL_COMMUNITY): Payer: Self-pay | Admitting: Emergency Medicine

## 2014-07-02 ENCOUNTER — Emergency Department (HOSPITAL_COMMUNITY)
Admission: EM | Admit: 2014-07-02 | Discharge: 2014-07-02 | Disposition: A | Discharge status: Home / Self Care | Payer: Self-pay | Attending: Emergency Medicine | Admitting: Emergency Medicine

## 2014-07-02 ENCOUNTER — Emergency Department (HOSPITAL_COMMUNITY): Payer: Self-pay

## 2014-07-02 DIAGNOSIS — D259 Leiomyoma of uterus, unspecified: Secondary | ICD-10-CM | POA: Insufficient documentation

## 2014-07-02 DIAGNOSIS — M1389 Other specified arthritis, multiple sites: Secondary | ICD-10-CM | POA: Insufficient documentation

## 2014-07-02 DIAGNOSIS — I10 Essential (primary) hypertension: Secondary | ICD-10-CM | POA: Insufficient documentation

## 2014-07-02 DIAGNOSIS — E119 Type 2 diabetes mellitus without complications: Secondary | ICD-10-CM | POA: Insufficient documentation

## 2014-07-02 DIAGNOSIS — Z880 Allergy status to penicillin: Secondary | ICD-10-CM | POA: Insufficient documentation

## 2014-07-02 DIAGNOSIS — F419 Anxiety disorder, unspecified: Secondary | ICD-10-CM | POA: Insufficient documentation

## 2014-07-02 DIAGNOSIS — F319 Bipolar disorder, unspecified: Secondary | ICD-10-CM | POA: Insufficient documentation

## 2014-07-02 DIAGNOSIS — J45909 Unspecified asthma, uncomplicated: Secondary | ICD-10-CM | POA: Insufficient documentation

## 2014-07-02 DIAGNOSIS — K219 Gastro-esophageal reflux disease without esophagitis: Secondary | ICD-10-CM | POA: Insufficient documentation

## 2014-07-02 DIAGNOSIS — G43909 Migraine, unspecified, not intractable, without status migrainosus: Secondary | ICD-10-CM | POA: Insufficient documentation

## 2014-07-02 DIAGNOSIS — Z79899 Other long term (current) drug therapy: Secondary | ICD-10-CM | POA: Insufficient documentation

## 2014-07-02 DIAGNOSIS — R109 Unspecified abdominal pain: Secondary | ICD-10-CM

## 2014-07-02 DIAGNOSIS — Z87891 Personal history of nicotine dependence: Secondary | ICD-10-CM | POA: Insufficient documentation

## 2014-07-02 LAB — CBC WITH DIFFERENTIAL/PLATELET
Basophils Absolute: 0 10*3/uL (ref 0.0–0.1)
Basophils Relative: 0 % (ref 0–1)
Eosinophils Absolute: 0.1 10*3/uL (ref 0.0–0.7)
Eosinophils Relative: 2 % (ref 0–5)
HCT: 31.5 % — ABNORMAL LOW (ref 36.0–46.0)
Hemoglobin: 9.8 g/dL — ABNORMAL LOW (ref 12.0–15.0)
LYMPHS ABS: 1.4 10*3/uL (ref 0.7–4.0)
Lymphocytes Relative: 26 % (ref 12–46)
MCH: 28 pg (ref 26.0–34.0)
MCHC: 31.1 g/dL (ref 30.0–36.0)
MCV: 90 fL (ref 78.0–100.0)
MONO ABS: 0.5 10*3/uL (ref 0.1–1.0)
Monocytes Relative: 8 % (ref 3–12)
Neutro Abs: 3.5 10*3/uL (ref 1.7–7.7)
Neutrophils Relative %: 64 % (ref 43–77)
PLATELETS: 204 10*3/uL (ref 150–400)
RBC: 3.5 MIL/uL — AB (ref 3.87–5.11)
RDW: 14.6 % (ref 11.5–15.5)
WBC: 5.5 10*3/uL (ref 4.0–10.5)

## 2014-07-02 LAB — BRAIN NATRIURETIC PEPTIDE: B NATRIURETIC PEPTIDE 5: 121.9 pg/mL — AB (ref 0.0–100.0)

## 2014-07-02 LAB — COMPREHENSIVE METABOLIC PANEL
ALT: 18 U/L (ref 0–35)
AST: 20 U/L (ref 0–37)
Albumin: 3.5 g/dL (ref 3.5–5.2)
Alkaline Phosphatase: 74 U/L (ref 39–117)
Anion gap: 3 — ABNORMAL LOW (ref 5–15)
BILIRUBIN TOTAL: 0.5 mg/dL (ref 0.3–1.2)
BUN: 23 mg/dL (ref 6–23)
CALCIUM: 8.8 mg/dL (ref 8.4–10.5)
CHLORIDE: 110 meq/L (ref 96–112)
CO2: 25 mmol/L (ref 19–32)
CREATININE: 0.94 mg/dL (ref 0.50–1.10)
GFR calc non Af Amer: 71 mL/min — ABNORMAL LOW (ref >90)
GFR, EST AFRICAN AMERICAN: 82 mL/min — AB (ref >90)
Glucose, Bld: 184 mg/dL — ABNORMAL HIGH (ref 70–99)
Potassium: 3.8 mmol/L (ref 3.5–5.1)
Sodium: 138 mmol/L (ref 135–145)
TOTAL PROTEIN: 6.6 g/dL (ref 6.0–8.3)

## 2014-07-02 LAB — URINE MICROSCOPIC-ADD ON

## 2014-07-02 LAB — URINALYSIS, ROUTINE W REFLEX MICROSCOPIC
Bilirubin Urine: NEGATIVE
Glucose, UA: 250 mg/dL — AB
HGB URINE DIPSTICK: NEGATIVE
Ketones, ur: NEGATIVE mg/dL
NITRITE: NEGATIVE
Protein, ur: NEGATIVE mg/dL
Specific Gravity, Urine: 1.018 (ref 1.005–1.030)
Urobilinogen, UA: 0.2 mg/dL (ref 0.0–1.0)
pH: 5 (ref 5.0–8.0)

## 2014-07-02 MED ORDER — IOHEXOL 300 MG/ML  SOLN
50.0000 mL | Freq: Once | INTRAMUSCULAR | Status: AC | PRN
  Administered 2014-07-02: 50 mL via ORAL

## 2014-07-02 MED ORDER — TRAMADOL HCL 50 MG PO TABS: 50.0000 mg | ORAL_TABLET | Freq: Four times a day (QID) | ORAL | Status: DC | PRN

## 2014-07-02 MED ORDER — SODIUM CHLORIDE 0.9 % IV SOLN
Freq: Once | INTRAVENOUS | Status: AC
  Administered 2014-07-02: 14:00:00 via INTRAVENOUS

## 2014-07-02 MED ORDER — IOHEXOL 300 MG/ML  SOLN
100.0000 mL | Freq: Once | INTRAMUSCULAR | Status: AC | PRN
  Administered 2014-07-02: 100 mL via INTRAVENOUS

## 2014-07-02 MED ORDER — HYDROMORPHONE HCL 1 MG/ML IJ SOLN
1.0000 mg | Freq: Once | INTRAMUSCULAR | Status: AC
Start: 2014-07-02 — End: 2014-07-02
  Administered 2014-07-02: 1 mg via INTRAVENOUS
  Filled 2014-07-02: qty 1

## 2014-07-02 NOTE — ED Notes (Signed)
Per EMS pt c/o home c/o left flank pain and left side pain x 2 months. Pt has been to several doctors and no one can figure out the pain.  Pt was prescribed celebrex but hasnt gotten it filled. Pt woke up with pain 6-8/10 and worse with movement.

## 2014-07-02 NOTE — Discharge Instructions (Signed)
YOu have left sided fibroid. See your [facility] doctor for further care, or call [facility] for optimal care. Take the meds provided for pain control. See your primary care doctor for the leg swelling.   Abdominal Pain, Women Abdominal (stomach, pelvic, or belly) pain can be caused by many things. It is important to tell your doctor:  The location of the pain.  Does it come and go or is it present all the time?  Are there things that start the pain (eating certain foods, exercise)?  Are there other symptoms associated with the pain (fever, nausea, vomiting, diarrhea)? All of this is helpful to know when trying to find the cause of the pain. CAUSES   Stomach: virus or bacteria infection, or ulcer.  Intestine: appendicitis (inflamed appendix), regional ileitis (Crohn's disease), ulcerative colitis (inflamed colon), irritable bowel syndrome, diverticulitis (inflamed diverticulum of the colon), or cancer of the stomach or intestine.  Gallbladder disease or stones in the gallbladder.  Kidney disease, kidney stones, or infection.  Pancreas infection or cancer.  Fibromyalgia (pain disorder).  Diseases of the female organs:  Uterus: fibroid (non-cancerous) tumors or infection.  Fallopian tubes: infection or tubal pregnancy.  Ovary: cysts or tumors.  Pelvic adhesions (scar tissue).  Endometriosis (uterus lining tissue growing in the pelvis and on the pelvic organs).  Pelvic congestion syndrome (female organs filling up with blood just before the menstrual period).  Pain with the menstrual period.  Pain with ovulation (producing an egg).  Pain with an IUD (intrauterine device, birth control) in the uterus.  Cancer of the female organs.  Functional pain (pain not caused by a disease, may improve without treatment).  Psychological pain.  Depression. DIAGNOSIS  Your doctor will decide the seriousness of your pain by doing an examination.  Blood  tests.  X-rays.  Ultrasound.  CT scan (computed tomography, special type of X-ray).  MRI (magnetic resonance imaging).  Cultures, for infection.  Barium enema (dye inserted in the large intestine, to better view it with X-rays).  Colonoscopy (looking in intestine with a lighted tube).  Laparoscopy (minor surgery, looking in abdomen with a lighted tube).  Major abdominal exploratory surgery (looking in abdomen with a large incision). TREATMENT  The treatment will depend on the cause of the pain.   Many cases can be observed and treated at home.  Over-the-counter medicines recommended by your caregiver.  Prescription medicine.  Antibiotics, for infection.  Birth control pills, for painful periods or for ovulation pain.  Hormone treatment, for endometriosis.  Nerve blocking injections.  Physical therapy.  Antidepressants.  Counseling with a psychologist or psychiatrist.  Minor or major surgery. HOME CARE INSTRUCTIONS   Do not take laxatives, unless directed by your caregiver.  Take over-the-counter pain medicine only if ordered by your caregiver. Do not take aspirin because it can cause an upset stomach or bleeding.  Try a clear liquid diet (broth or water) as ordered by your caregiver. Slowly move to a bland diet, as tolerated, if the pain is related to the stomach or intestine.  Have a thermometer and take your temperature several times a day, and record it.  Bed rest and sleep, if it helps the pain.  Avoid sexual intercourse, if it causes pain.  Avoid stressful situations.  Keep your follow-up appointments and tests, as your caregiver orders.  If the pain does not go away with medicine or surgery, you may try:  Acupuncture.  Relaxation exercises (yoga, meditation).  Group therapy.  Counseling. Maureen Swanson  IF:   You notice certain foods cause stomach pain.  Your home care treatment is not helping your pain.  You need stronger pain  medicine.  You want your IUD removed.  You feel faint or lightheaded.  You develop nausea and vomiting.  You develop a rash.  You are having side effects or an allergy to your medicine. SEEK IMMEDIATE MEDICAL CARE IF:   Your pain does not go away or gets worse.  You have a fever.  Your pain is felt only in portions of the abdomen. The right side could possibly be appendicitis. The left lower portion of the abdomen could be colitis or diverticulitis.  You are passing blood in your stools (bright red or black tarry stools, with or without vomiting).  You have blood in your urine.  You develop chills, with or without a fever.  You pass out. MAKE SURE YOU:   Understand these instructions.  Will watch your condition.  Will get help right away if you are not doing well or get worse. Document Released: 03/30/2007 Document Revised: 10/17/2013 Document Reviewed: 04/19/2009 [facility] Swanson Information 2015 [place], Maureen Swanson. This information is not intended to replace advice given to you by your health care provider. Make sure you discuss any questions you have with your health care provider.  Fibroids Fibroids are lumps (tumors) that can occur any place in a woman's body. These lumps are not cancerous. Fibroids vary in size, weight, and where they grow. HOME CARE  Do not take aspirin.  Write down the number of pads or tampons you use during your period. Tell your doctor. This can help determine the best treatment for you. GET HELP RIGHT AWAY IF:  You have pain in your lower belly (abdomen) that is not helped with medicine.  You have cramps that are not helped with medicine.  You have more bleeding between or during your period.  You feel lightheaded or pass out (faint).  Your lower belly pain gets worse. MAKE SURE YOU:  Understand these instructions.  Will watch your condition.  Will get help right away if you are not doing well or get worse. Document Released:  07/05/2010 Document Revised: 08/25/2011 Document Reviewed: 07/05/2010 [facility] Swanson Information 2015 [place], Maureen Swanson. This information is not intended to replace advice given to you by your health care provider. Make sure you discuss any questions you have with your health care provider.

## 2014-07-02 NOTE — ED Provider Notes (Signed)
CSN: [id number]     Arrival date & time 07/02/14  1259 History   First MD Initiated Contact with Patient 07/02/14 1338     Chief Complaint  Patient presents with  . Back Pain  . left side pain   . Leg Swelling     (Consider location/radiation/quality/duration/timing/severity/associated sxs/prior Treatment) HPI Comments: 49 y/o F hypertension, high cholesterol, diabetes, GERD, bipolar 1 and anxiety, and hx of cocaine abuse comes in w/ cc of L flank pain, bilateral leg swelling, and L abd pain. Pt's flank pain has been present for 2 months. Constant, sharp, with no uti like sx. Pt has LLQ abd pain is more recent - around 2 weeks. She thinks the flank and the anterior pain are different. The anterior pain is present only with palpation. She has no hx of GU pathology, or fibroids. PT also has bilateral leg swelling that is new, with no orthopnea, chest pain, new cough, pnd symptoms. Her HCTZ was increased by pcp.  Patient is a 49 y.o. female presenting with back pain. The history is provided by the patient.  Back Pain Associated symptoms: abdominal pain   Associated symptoms: no chest pain and no headaches     Past Medical History  Diagnosis Date  . High cholesterol   . Depression   . Gout   . Anxiety   . Bipolar 1 disorder   . GERD (gastroesophageal reflux disease)   . Substance abuse     crack cocaine  . Headache(784.0)     migraines  . TMJ (temporomandibular joint disorder)   . Asthma     daily and prn inhalers  . Arthritis     back and knees  . Gastric ulcer   . 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 Hypertension     under control, has been on med. x 12 yrs.  . Diabetes mellitus     diet-controlled   Past Surgical History  Procedure Laterality Date  . Knee arthroscopy w/ partial medial meniscectomy  05/01/2010    right  . Breast lumpectomy with needle localization  04/19/2012    Procedure: BREAST LUMPECTOMY WITH NEEDLE LOCALIZATION;  Surgeon:  [name], MD;  Location: [facility];  Service: General;  Laterality: Right;   Family History  Problem Relation Age of Onset  . Diabetes Mother   . Cancer Mother     breast  . Cancer Father    History  Substance Use Topics  . Smoking status: Former Smoker -- 15 years    Quit date: 08/02/2012  . Smokeless tobacco: Never Used     Comment: quit smoking 02/2012  . Alcohol Use: No     Comment: no longer drinking   OB History    No data available     Review of Systems  Constitutional: Negative for activity change.  HENT: Negative for facial swelling.   Respiratory: Negative for cough, shortness of breath and wheezing.   Cardiovascular: Negative for chest pain.  Gastrointestinal: Positive for abdominal pain. Negative for nausea, vomiting, diarrhea, constipation, blood in stool and abdominal distention.  Genitourinary: Positive for flank pain. Negative for hematuria and difficulty urinating.  Musculoskeletal: Positive for back pain. Negative for neck pain.  Skin: Negative for color change.  Neurological: Negative for syncope, speech difficulty and headaches.  Hematological: Does not bruise/bleed easily.  Psychiatric/Behavioral: Negative for confusion.      Allergies  Chocolate; Orange; Penicillins; and Tomato  Home Medications  Prior to Admission medications   Medication Sig Start Date End Date Taking? Authorizing Provider  albuterol (PROVENTIL HFA;VENTOLIN HFA) 108 (90 BASE) MCG/ACT inhaler Inhale 2 puffs into the lungs every 6 (six) hours as needed for wheezing or shortness of breath. 03/02/14  Yes Encarnacion Slates, NP  atenolol (TENORMIN) 100 MG tablet Take 1 tablet (100 mg total) by mouth daily. For hypertension 03/02/14  Yes Encarnacion Slates, NP  Calcium Carbonate 500 MG TABS Take 1 tablet by mouth daily.   Yes Historical Provider, MD  cetirizine (ZYRTEC) 10 MG tablet Take 10 mg by mouth daily.   Yes Historical Provider, MD  Diclofenac Sodium CR 100 MG 24 hr  tablet Take 100 mg by mouth 2 (two) times daily as needed for pain.   Yes Historical Provider, MD  hydrochlorothiazide (HYDRODIURIL) 25 MG tablet Take 1 tablet (25 mg total) by mouth daily. For hypertension 03/02/14  Yes Encarnacion Slates, NP  omeprazole (PRILOSEC) 20 MG capsule Take 20 mg by mouth daily.   Yes Historical Provider, MD  OXcarbazepine (TRILEPTAL) 150 MG tablet Take 150 mg by mouth 2 (two) times daily.   Yes Historical Provider, MD  QUEtiapine (SEROQUEL XR) 400 MG 24 hr tablet Take 2 tablets (800 mg total) by mouth at bedtime. For mood control Patient taking differently: Take 400-800 mg by mouth at bedtime. For mood control 03/02/14  Yes Encarnacion Slates, NP  benztropine (COGENTIN) 1 MG tablet Take 1 tablet (1 mg total) by mouth 2 (two) times daily. For prevention of drug induced involuntary movement Patient not taking: Reported on 07/02/2014 03/02/14   Encarnacion Slates, NP  buPROPion [facility] SR) 200 MG 12 hr tablet Take 1 tablet (200 mg total) by mouth 2 (two) times daily. For depression Patient not taking: Reported on 07/02/2014 01/31/13   Encarnacion Slates, NP  hydrALAZINE (APRESOLINE) 25 MG tablet Take 1 tablet (25 mg total) by mouth 3 (three) times daily. For hypertension Patient not taking: Reported on 07/02/2014 03/02/14   Encarnacion Slates, NP  hydrOXYzine (ATARAX/VISTARIL) 25 MG tablet Take 1 tablet (25 mg total) by mouth every 6 (six) hours as needed for anxiety. Patient not taking: Reported on 07/02/2014 03/02/14   Encarnacion Slates, NP  lamoTRIgine (LAMICTAL) 25 MG tablet Take 10 tablets (250 mg total) by mouth daily. For mood stabilization Patient not taking: Reported on 07/02/2014 03/02/14   Encarnacion Slates, NP  loratadine (CLARITIN) 10 MG tablet Take 1 tablet (10 mg total) by mouth daily. (May be purchased from over the counter): For allergies Patient not taking: Reported on 07/02/2014 03/02/14   Encarnacion Slates, NP  Oxcarbazepine (TRILEPTAL) 600 MG tablet Take 1 tablet (600 mg total) by mouth 2 (two)  times daily. For mood stabilization Patient not taking: Reported on 07/02/2014 03/02/14   Encarnacion Slates, NP  pantoprazole (PROTONIX) 40 MG tablet Take 1 tablet (40 mg total) by mouth at bedtime. For acid reflux Patient not taking: Reported on 07/02/2014 03/02/14   Encarnacion Slates, NP  polyethylene glycol powder (GLYCOLAX/MIRALAX) powder Take 17 g by mouth 2 (two) times daily. Until daily soft stools  OTC Patient not taking: Reported on 07/02/2014 06/08/14   Viona Gilmore Cartner, PA-C  traMADol (ULTRAM) 50 MG tablet Take 1 tablet (50 mg total) by mouth every 6 (six) hours as needed. 07/02/14   Clayton Bibles, PA-C  traZODone (DESYREL) 100 MG tablet Take 1 tablet (100 mg total) by mouth at bedtime as needed for  sleep. Patient not taking: Reported on 07/02/2014 03/02/14   Encarnacion Slates, NP   BP 127/89 mmHg  Pulse 79  Temp(Src) 98.1 F (36.7 C) (Oral)  Resp 18  SpO2 97% Physical Exam  Constitutional: She is oriented to person, place, and time. She appears well-developed and well-nourished.  HENT:  Head: Normocephalic and atraumatic.  Eyes: EOM are normal. Pupils are equal, round, and reactive to light.  Neck: Neck supple.  Cardiovascular: Normal rate, regular rhythm, normal heart sounds and intact distal pulses.   Pulmonary/Chest: Effort normal. No respiratory distress.  Abdominal: Soft. She exhibits no distension. There is tenderness. There is no rebound and no guarding.  LUQ tenderness to palpation, no splenomegaly.  Genitourinary:  Flanl L tenderness with palpation  Musculoskeletal:  No pitting edema appreciated, 2+ DP, warm extremities  Neurological: She is alert and oriented to person, place, and time.  Skin: Skin is warm and dry.  Nursing note and vitals reviewed.   ED Course  Procedures (including critical care time) Labs Review Labs Reviewed  CBC WITH DIFFERENTIAL - Abnormal; Notable for the following:    RBC 3.50 (*)    Hemoglobin 9.8 (*)    HCT 31.5 (*)    All other components  within normal limits  COMPREHENSIVE METABOLIC PANEL - Abnormal; Notable for the following:    Glucose, Bld 184 (*)    GFR calc non Af Amer 71 (*)    GFR calc Af Amer 82 (*)    Anion gap 3 (*)    All other components within normal limits  URINALYSIS, ROUTINE W REFLEX MICROSCOPIC - Abnormal; Notable for the following:    APPearance CLOUDY (*)    Glucose, UA 250 (*)    Leukocytes, UA MODERATE (*)    All other components within normal limits  BRAIN NATRIURETIC PEPTIDE - Abnormal; Notable for the following:    B Natriuretic Peptide 121.9 (*)    All other components within normal limits  URINE CULTURE  URINE MICROSCOPIC-ADD ON    Imaging Review Ct Abdomen Pelvis W Contrast  07/02/2014   CLINICAL DATA:  LEFT lower quadrant pain for 2-4 days, LEFT flank and LEFT side pain for 2 months of uncertain etiology mi history diabetes, hypertension, prior gastric ulcer, GERD  EXAM: CT ABDOMEN AND PELVIS WITH CONTRAST  TECHNIQUE: Multidetector CT imaging of the abdomen and pelvis was performed using the standard protocol following bolus administration of intravenous contrast. Sagittal and coronal MPR images reconstructed from axial data set.  CONTRAST:  13mL OMNIPAQUE IOHEXOL 300 MG/ML SOLN PO, 170mL OMNIPAQUE IOHEXOL 300 MG/ML SOLN IV  COMPARISON:  04/23/2012  FINDINGS: Minimal bibasilar atelectasis.  Small splenule at splenic hilum.  Liver, spleen, pancreas, kidneys, and adrenal glands normal.  Normal appendix.  4.1 x 4.1 x4.2 cm diameter LEFT lateral uterine leiomyoma identified with small central calcifications.  Uterus, adnexae, bladder, and ureters unremarkable.  No urinary tract calcification or dilatation.  Stomach and bowel loops normal appearance.  No mass, adenopathy, free fluid, free air, hernia, or acute inflammatory process.  Osseous structures unremarkable.  IMPRESSION: Uterine leiomyoma 4.1 x 4.1 x 4.2 cm.  No acute intra-abdominal or intrapelvic abnormalities.   Electronically Signed   By: Lavonia Dana M.D.   On: 07/02/2014 16:22     EKG Interpretation None      MDM   Final diagnoses:  Left sided abdominal pain  Acute flank pain  Uterine leiomyoma, unspecified location    Pt comes in with cc of  L sided pain. She has had L flank pain for several weeks and now experiencing LUQ pain. Exam is non surgical. PT has DM, hx of cocaine abuse - so we elected to get CT abd as the imaging modality of choice, to evaluate for any vascular pathology, infectious/inflammatory pathology (diverticulitis etc.) and masses. PT has L sided fibroid. Pain chronic, so unlikely to be TOA. No SIRs criteria. Pt has been advised to see Gynecologist, and womens outpatient info has been provided.   Varney Biles, MD 07/04/14 1535

## 2014-07-03 LAB — URINE CULTURE

## 2014-07-20 ENCOUNTER — Emergency Department (HOSPITAL_COMMUNITY)
Admission: EM | Admit: 2014-07-20 | Discharge: 2014-07-21 | Disposition: A | Discharge status: Other Acute Inpatient Hospital | Payer: Federal, State, Local not specified - Other | Attending: Emergency Medicine | Admitting: Emergency Medicine

## 2014-07-20 DIAGNOSIS — J45909 Unspecified asthma, uncomplicated: Secondary | ICD-10-CM | POA: Insufficient documentation

## 2014-07-20 DIAGNOSIS — K219 Gastro-esophageal reflux disease without esophagitis: Secondary | ICD-10-CM | POA: Insufficient documentation

## 2014-07-20 DIAGNOSIS — T43502A Poisoning by unspecified antipsychotics and neuroleptics, intentional self-harm, initial encounter: Secondary | ICD-10-CM | POA: Insufficient documentation

## 2014-07-20 DIAGNOSIS — Z880 Allergy status to penicillin: Secondary | ICD-10-CM | POA: Insufficient documentation

## 2014-07-20 DIAGNOSIS — M1990 Unspecified osteoarthritis, unspecified site: Secondary | ICD-10-CM | POA: Insufficient documentation

## 2014-07-20 DIAGNOSIS — T50902A Poisoning by unspecified drugs, medicaments and biological substances, intentional self-harm, initial encounter: Secondary | ICD-10-CM

## 2014-07-20 DIAGNOSIS — E119 Type 2 diabetes mellitus without complications: Secondary | ICD-10-CM | POA: Insufficient documentation

## 2014-07-20 DIAGNOSIS — F419 Anxiety disorder, unspecified: Secondary | ICD-10-CM | POA: Insufficient documentation

## 2014-07-20 DIAGNOSIS — Z791 Long term (current) use of non-steroidal anti-inflammatories (NSAID): Secondary | ICD-10-CM | POA: Insufficient documentation

## 2014-07-20 DIAGNOSIS — Y998 Other external cause status: Secondary | ICD-10-CM | POA: Insufficient documentation

## 2014-07-20 DIAGNOSIS — F1910 Other psychoactive substance abuse, uncomplicated: Secondary | ICD-10-CM | POA: Insufficient documentation

## 2014-07-20 DIAGNOSIS — Z79899 Other long term (current) drug therapy: Secondary | ICD-10-CM | POA: Insufficient documentation

## 2014-07-20 DIAGNOSIS — I10 Essential (primary) hypertension: Secondary | ICD-10-CM | POA: Insufficient documentation

## 2014-07-20 DIAGNOSIS — F319 Bipolar disorder, unspecified: Secondary | ICD-10-CM | POA: Insufficient documentation

## 2014-07-20 DIAGNOSIS — Y9389 Activity, other specified: Secondary | ICD-10-CM | POA: Insufficient documentation

## 2014-07-20 DIAGNOSIS — Y9289 Other specified places as the place of occurrence of the external cause: Secondary | ICD-10-CM | POA: Insufficient documentation

## 2014-07-20 DIAGNOSIS — F1410 Cocaine abuse, uncomplicated: Secondary | ICD-10-CM | POA: Insufficient documentation

## 2014-07-20 DIAGNOSIS — T421X2A Poisoning by iminostilbenes, intentional self-harm, initial encounter: Secondary | ICD-10-CM | POA: Insufficient documentation

## 2014-07-20 DIAGNOSIS — F332 Major depressive disorder, recurrent severe without psychotic features: Secondary | ICD-10-CM | POA: Diagnosis present

## 2014-07-20 DIAGNOSIS — Z87891 Personal history of nicotine dependence: Secondary | ICD-10-CM | POA: Insufficient documentation

## 2014-07-20 LAB — COMPREHENSIVE METABOLIC PANEL
ALT: 15 U/L (ref 0–35)
AST: 24 U/L (ref 0–37)
Albumin: 4.2 g/dL (ref 3.5–5.2)
Alkaline Phosphatase: 77 U/L (ref 39–117)
Anion gap: 13 (ref 5–15)
BUN: 17 mg/dL (ref 6–23)
CO2: 20 mmol/L (ref 19–32)
Calcium: 9.2 mg/dL (ref 8.4–10.5)
Chloride: 104 mmol/L (ref 96–112)
Creatinine, Ser: 0.98 mg/dL (ref 0.50–1.10)
GFR calc non Af Amer: 67 mL/min — ABNORMAL LOW (ref >90)
GFR, EST AFRICAN AMERICAN: 78 mL/min — AB (ref >90)
Glucose, Bld: 83 mg/dL (ref 70–99)
POTASSIUM: 3.8 mmol/L (ref 3.5–5.1)
SODIUM: 137 mmol/L (ref 135–145)
TOTAL PROTEIN: 7.7 g/dL (ref 6.0–8.3)
Total Bilirubin: 0.7 mg/dL (ref 0.3–1.2)

## 2014-07-20 LAB — ETHANOL: ALCOHOL ETHYL (B): 128 mg/dL — AB (ref 0–9)

## 2014-07-20 LAB — RAPID URINE DRUG SCREEN, HOSP PERFORMED
Amphetamines: NOT DETECTED
Barbiturates: NOT DETECTED
Benzodiazepines: NOT DETECTED
Cocaine: POSITIVE — AB
Opiates: NOT DETECTED
TETRAHYDROCANNABINOL: NOT DETECTED

## 2014-07-20 LAB — SALICYLATE LEVEL

## 2014-07-20 LAB — CBC
HEMATOCRIT: 34.9 % — AB (ref 36.0–46.0)
Hemoglobin: 11 g/dL — ABNORMAL LOW (ref 12.0–15.0)
MCH: 27.9 pg (ref 26.0–34.0)
MCHC: 31.5 g/dL (ref 30.0–36.0)
MCV: 88.6 fL (ref 78.0–100.0)
Platelets: 279 10*3/uL (ref 150–400)
RBC: 3.94 MIL/uL (ref 3.87–5.11)
RDW: 13.9 % (ref 11.5–15.5)
WBC: 8.6 10*3/uL (ref 4.0–10.5)

## 2014-07-20 LAB — ACETAMINOPHEN LEVEL: Acetaminophen (Tylenol), Serum: <10 ug/mL — ABNORMAL LOW (ref 10–30)

## 2014-07-20 MED ORDER — PANTOPRAZOLE SODIUM 40 MG PO TBEC
40.0000 mg | DELAYED_RELEASE_TABLET | Freq: Every day | ORAL | Status: DC
  Administered 2014-07-20: 40 mg via ORAL
  Filled 2014-07-20: qty 1

## 2014-07-20 MED ORDER — HYDROXYZINE HCL 25 MG PO TABS
25.0000 mg | ORAL_TABLET | Freq: Four times a day (QID) | ORAL | Status: DC | PRN
  Administered 2014-07-20 – 2014-07-21 (×2): 25 mg via ORAL
  Filled 2014-07-20 (×2): qty 1

## 2014-07-20 MED ORDER — HYDROCHLOROTHIAZIDE 25 MG PO TABS
25.0000 mg | ORAL_TABLET | Freq: Every day | ORAL | Status: DC
  Administered 2014-07-20 – 2014-07-21 (×2): 25 mg via ORAL
  Filled 2014-07-20 (×2): qty 1

## 2014-07-20 MED ORDER — ATENOLOL 100 MG PO TABS
100.0000 mg | ORAL_TABLET | Freq: Every day | ORAL | Status: DC
  Administered 2014-07-20 – 2014-07-21 (×2): 100 mg via ORAL
  Filled 2014-07-20 (×2): qty 1

## 2014-07-20 MED ORDER — ALBUTEROL SULFATE HFA 108 (90 BASE) MCG/ACT IN AERS: 2.0000 | INHALATION_SPRAY | Freq: Four times a day (QID) | RESPIRATORY_TRACT | Status: DC | PRN

## 2014-07-20 MED ORDER — HYDRALAZINE HCL 25 MG PO TABS: 25.0000 mg | ORAL_TABLET | Freq: Three times a day (TID) | ORAL | Status: DC

## 2014-07-20 MED ORDER — LORATADINE 10 MG PO TABS
10.0000 mg | ORAL_TABLET | Freq: Every day | ORAL | Status: DC
  Administered 2014-07-20 – 2014-07-21 (×2): 10 mg via ORAL
  Filled 2014-07-20 (×2): qty 1

## 2014-07-20 MED ORDER — METFORMIN HCL 500 MG PO TABS
500.0000 mg | ORAL_TABLET | Freq: Every evening | ORAL | Status: DC
  Administered 2014-07-20: 500 mg via ORAL
  Filled 2014-07-20 (×2): qty 1

## 2014-07-20 NOTE — ED Notes (Signed)
Bed: RESA Expected date:  Expected time:  Means of arrival:  Comments: EMS/O.D. 

## 2014-07-20 NOTE — ED Notes (Signed)
Bed: WA17 Expected date:  Expected time:  Means of arrival:  Comments: Res A 

## 2014-07-20 NOTE — ED Notes (Signed)
Per EMS, Pt, picked up at a [employer], c/o intentionally overdosing on  600mg  Trileptal and 1600mg  Seroquel XR and drank a 12 pack of beer around 0930.  Pt is prescribed 300mg  and 400mg , respectively, daily.  EMS sts Pt was A & Ox4 en route.  Pt began "rocking and flailing arms" en route.  Hx of anxiety, depression, and Bipolar 1.  Pt admits to ingesting crack and marijuana.  Pt remains SI and HI toward her ex boyfriend.

## 2014-07-20 NOTE — ED Provider Notes (Signed)
CSN: [id number]     Arrival date & time 07/20/14  1133 History   First MD Initiated Contact with Patient 07/20/14 1145     Chief Complaint  Patient presents with  . Drug Overdose    Level V caveat due to altered mental status (Consider location/radiation/quality/duration/timing/severity/associated sxs/prior Treatment) The history is provided by the patient.   patient with overdose. Reportedly took 600 mg of Trileptal and 1200 mg of Seroquel XR. These are both reportedly her medicines. Took around 9:30 this morning in a suicide attempt. Also reportedly did alcohol and crack cocaine. Patient is somewhat sedate and difficult to get history from.    Past Medical History  Diagnosis Date  . High cholesterol   . Depression   . Gout   . Anxiety   . Bipolar 1 disorder   . GERD (gastroesophageal reflux disease)   . Substance abuse     crack cocaine  . Headache(784.0)     migraines  . TMJ (temporomandibular joint disorder)   . Asthma     daily and prn inhalers  . Arthritis     back and knees  . Gastric ulcer   . 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 Hypertension     under control, has been on med. x 12 yrs.  . Diabetes mellitus     diet-controlled   Past Surgical History  Procedure Laterality Date  . Knee arthroscopy w/ partial medial meniscectomy  05/01/2010    right  . Breast lumpectomy with needle localization  04/19/2012    Procedure: BREAST LUMPECTOMY WITH NEEDLE LOCALIZATION;  Surgeon: [name], MD;  Location: [facility];  Service: General;  Laterality: Right;   Family History  Problem Relation Age of Onset  . Diabetes Mother   . Cancer Mother     breast  . Cancer Father    History  Substance Use Topics  . Smoking status: Former Smoker -- 15 years    Quit date: 08/02/2012  . Smokeless tobacco: Never Used     Comment: quit smoking 02/2012  . Alcohol Use: No     Comment: no longer drinking   OB History    No data available      Review of Systems  Unable to perform ROS     Allergies  Chocolate; Orange; Penicillins; and Tomato  Home Medications   Prior to Admission medications   Medication Sig Start Date End Date Taking? Authorizing Provider  atenolol (TENORMIN) 100 MG tablet Take 1 tablet (100 mg total) by mouth daily. For hypertension 03/02/14  Yes Encarnacion Slates, NP  celecoxib (CELEBREX) 100 MG capsule Take 100 mg by mouth 2 (two) times daily.   Yes Historical Provider, MD  cetirizine (ZYRTEC) 10 MG tablet Take 10 mg by mouth daily.   Yes Historical Provider, MD  Diclofenac Sodium CR 100 MG 24 hr tablet Take 100 mg by mouth 2 (two) times daily as needed for pain.   Yes Historical Provider, MD  hydrochlorothiazide (HYDRODIURIL) 25 MG tablet Take 1 tablet (25 mg total) by mouth daily. For hypertension 03/02/14  Yes Encarnacion Slates, NP  metFORMIN (GLUCOPHAGE) 500 MG tablet Take 500 mg by mouth every evening.   Yes Historical Provider, MD  omeprazole (PRILOSEC) 20 MG capsule Take 20 mg by mouth daily.   Yes Historical Provider, MD  OXcarbazepine (TRILEPTAL) 150 MG tablet Take 150 mg by mouth 2 (two) times daily.   Yes Historical Provider,  MD  [place] Calcium 500 MG TABS Take 1 tablet by mouth daily.   Yes Historical Provider, MD  QUEtiapine (SEROQUEL XR) 400 MG 24 hr tablet Take 2 tablets (800 mg total) by mouth at bedtime. For mood control Patient taking differently: Take 400-800 mg by mouth at bedtime. For mood control 03/02/14  Yes Encarnacion Slates, NP  albuterol (PROVENTIL HFA;VENTOLIN HFA) 108 (90 BASE) MCG/ACT inhaler Inhale 2 puffs into the lungs every 6 (six) hours as needed for wheezing or shortness of breath. 03/02/14   Encarnacion Slates, NP  benztropine (COGENTIN) 1 MG tablet Take 1 tablet (1 mg total) by mouth 2 (two) times daily. For prevention of drug induced involuntary movement Patient not taking: Reported on 07/02/2014 03/02/14   Encarnacion Slates, NP  buPROPion [facility] SR) 200 MG 12 hr tablet Take 1  tablet (200 mg total) by mouth 2 (two) times daily. For depression Patient not taking: Reported on 07/02/2014 01/31/13   Encarnacion Slates, NP  Calcium Carbonate 500 MG TABS Take 1 tablet by mouth daily.    Historical Provider, MD  hydrALAZINE (APRESOLINE) 25 MG tablet Take 1 tablet (25 mg total) by mouth 3 (three) times daily. For hypertension Patient not taking: Reported on 07/02/2014 03/02/14   Encarnacion Slates, NP  hydrOXYzine (ATARAX/VISTARIL) 25 MG tablet Take 1 tablet (25 mg total) by mouth every 6 (six) hours as needed for anxiety. Patient not taking: Reported on 07/02/2014 03/02/14   Encarnacion Slates, NP  lamoTRIgine (LAMICTAL) 25 MG tablet Take 10 tablets (250 mg total) by mouth daily. For mood stabilization Patient not taking: Reported on 07/02/2014 03/02/14   Encarnacion Slates, NP  loratadine (CLARITIN) 10 MG tablet Take 1 tablet (10 mg total) by mouth daily. (May be purchased from over the counter): For allergies Patient not taking: Reported on 07/02/2014 03/02/14   Encarnacion Slates, NP  Oxcarbazepine (TRILEPTAL) 600 MG tablet Take 1 tablet (600 mg total) by mouth 2 (two) times daily. For mood stabilization Patient not taking: Reported on 07/02/2014 03/02/14   Encarnacion Slates, NP  pantoprazole (PROTONIX) 40 MG tablet Take 1 tablet (40 mg total) by mouth at bedtime. For acid reflux Patient not taking: Reported on 07/02/2014 03/02/14   Encarnacion Slates, NP  polyethylene glycol powder (GLYCOLAX/MIRALAX) powder Take 17 g by mouth 2 (two) times daily. Until daily soft stools  OTC Patient not taking: Reported on 07/02/2014 06/08/14   Viona Gilmore Cartner, PA-C  traMADol (ULTRAM) 50 MG tablet Take 1 tablet (50 mg total) by mouth every 6 (six) hours as needed. 07/02/14   Clayton Bibles, PA-C  traZODone (DESYREL) 100 MG tablet Take 1 tablet (100 mg total) by mouth at bedtime as needed for sleep. Patient not taking: Reported on 07/02/2014 03/02/14   Encarnacion Slates, NP   BP 132/86 mmHg  Pulse 70  Temp(Src) 99.1 F (37.3 C) (Oral)   Resp 20  SpO2 98% Physical Exam  Constitutional: She appears well-developed.  HENT:  Head: Atraumatic.  Eyes:  Pupils are somewhat sluggish and midpoint.  Cardiovascular: Normal rate and regular rhythm.   Pulmonary/Chest: Effort normal.  Abdominal: Soft. There is no tenderness.  Neurological:  Patient is somewhat sedate. Will answer some questions but has slurred speech and is mildly confused.  Skin: Skin is warm.    ED Course  Procedures (including critical care time) Labs Review Labs Reviewed  ACETAMINOPHEN LEVEL - Abnormal; Notable for the following:    Acetaminophen (Tylenol),  Serum <10.0 (*)    All other components within normal limits  CBC - Abnormal; Notable for the following:    Hemoglobin 11.0 (*)    HCT 34.9 (*)    All other components within normal limits  COMPREHENSIVE METABOLIC PANEL - Abnormal; Notable for the following:    GFR calc non Af Amer 67 (*)    GFR calc Af Amer 78 (*)    All other components within normal limits  ETHANOL - Abnormal; Notable for the following:    Alcohol, Ethyl (B) 128 (*)    All other components within normal limits  URINE RAPID DRUG SCREEN (HOSP PERFORMED) - Abnormal; Notable for the following:    Cocaine POSITIVE (*)    All other components within normal limits  SALICYLATE LEVEL    Imaging Review No results found.   EKG Interpretation   Date/Time:  Thursday July 20 2014 11:39:08 EST Ventricular Rate:  72 PR Interval:  162 QRS Duration: 87 QT Interval:  409 QTC Calculation: 448 R Axis:   22 Text Interpretation:  Sinus rhythm Confirmed by Alvino Chapel  MD, Ovid Curd  [phone]) on 07/20/2014 12:32:32 PM      MDM   Final diagnoses:  Substance abuse  Suicide attempt by drug ingestion, initial encounter    Patient with an overdose on her Seroquel and Trileptal. He was done around 9:30 this morning. Poison control's been consulted. No arrhythmia and a this point patient appears her back to her baseline. She appears to  medically cleared and will be seen by TTS. She has been involuntarily committed due to her suicide attempt.    Jasper Riling. Alvino Chapel, MD 07/20/14 [phone]

## 2014-07-20 NOTE — ED Notes (Signed)
MD at bedside. EDP PICKERING PRESENT 
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
## 2014-07-20 NOTE — ED Notes (Signed)
Poison control called, recommendations are to get a tylenol level, just monitor patient for a few hours. Watch for QT prolongation, seizures, and drowsiness due to seroquel. If in a few hours pt is at mental baseline pt can be moved to psych.

## 2014-07-20 NOTE — ED Notes (Signed)
[place]

## 2014-07-21 ENCOUNTER — Encounter (HOSPITAL_COMMUNITY): Payer: Self-pay | Admitting: *Deleted

## 2014-07-21 ENCOUNTER — Inpatient Hospital Stay (HOSPITAL_COMMUNITY)
Admission: AD | Admit: 2014-07-21 | Discharge: 2014-08-02 | DRG: 885 | Disposition: A | Discharge status: Home / Self Care | Payer: Federal, State, Local not specified - Other | Source: Intra-hospital | Attending: Psychiatry | Admitting: Psychiatry

## 2014-07-21 DIAGNOSIS — E669 Obesity, unspecified: Secondary | ICD-10-CM | POA: Diagnosis present

## 2014-07-21 DIAGNOSIS — Z87891 Personal history of nicotine dependence: Secondary | ICD-10-CM | POA: Diagnosis not present

## 2014-07-21 DIAGNOSIS — F1910 Other psychoactive substance abuse, uncomplicated: Secondary | ICD-10-CM | POA: Insufficient documentation

## 2014-07-21 DIAGNOSIS — Z803 Family history of malignant neoplasm of breast: Secondary | ICD-10-CM | POA: Diagnosis not present

## 2014-07-21 DIAGNOSIS — M170 Bilateral primary osteoarthritis of knee: Secondary | ICD-10-CM | POA: Diagnosis present

## 2014-07-21 DIAGNOSIS — K219 Gastro-esophageal reflux disease without esophagitis: Secondary | ICD-10-CM | POA: Diagnosis present

## 2014-07-21 DIAGNOSIS — Z833 Family history of diabetes mellitus: Secondary | ICD-10-CM | POA: Diagnosis not present

## 2014-07-21 DIAGNOSIS — F1420 Cocaine dependence, uncomplicated: Secondary | ICD-10-CM | POA: Insufficient documentation

## 2014-07-21 DIAGNOSIS — E119 Type 2 diabetes mellitus without complications: Secondary | ICD-10-CM | POA: Diagnosis present

## 2014-07-21 DIAGNOSIS — F251 Schizoaffective disorder, depressive type: Secondary | ICD-10-CM | POA: Diagnosis present

## 2014-07-21 DIAGNOSIS — R45850 Homicidal ideations: Secondary | ICD-10-CM | POA: Diagnosis present

## 2014-07-21 DIAGNOSIS — T50902A Poisoning by unspecified drugs, medicaments and biological substances, intentional self-harm, initial encounter: Secondary | ICD-10-CM | POA: Insufficient documentation

## 2014-07-21 DIAGNOSIS — I10 Essential (primary) hypertension: Secondary | ICD-10-CM | POA: Diagnosis present

## 2014-07-21 DIAGNOSIS — Z23 Encounter for immunization: Secondary | ICD-10-CM

## 2014-07-21 DIAGNOSIS — Z590 Homelessness: Secondary | ICD-10-CM

## 2014-07-21 DIAGNOSIS — F319 Bipolar disorder, unspecified: Secondary | ICD-10-CM | POA: Diagnosis present

## 2014-07-21 DIAGNOSIS — R45851 Suicidal ideations: Secondary | ICD-10-CM | POA: Diagnosis present

## 2014-07-21 DIAGNOSIS — J45909 Unspecified asthma, uncomplicated: Secondary | ICD-10-CM | POA: Diagnosis present

## 2014-07-21 DIAGNOSIS — G4700 Insomnia, unspecified: Secondary | ICD-10-CM | POA: Diagnosis present

## 2014-07-21 DIAGNOSIS — F332 Major depressive disorder, recurrent severe without psychotic features: Secondary | ICD-10-CM | POA: Diagnosis present

## 2014-07-21 DIAGNOSIS — E785 Hyperlipidemia, unspecified: Secondary | ICD-10-CM | POA: Diagnosis present

## 2014-07-21 DIAGNOSIS — E780 Pure hypercholesterolemia: Secondary | ICD-10-CM | POA: Diagnosis present

## 2014-07-21 DIAGNOSIS — F419 Anxiety disorder, unspecified: Secondary | ICD-10-CM | POA: Diagnosis present

## 2014-07-21 DIAGNOSIS — F250 Schizoaffective disorder, bipolar type: Secondary | ICD-10-CM | POA: Diagnosis present

## 2014-07-21 LAB — CBG MONITORING, ED: GLUCOSE-CAPILLARY: 116 mg/dL — AB (ref 70–99)

## 2014-07-21 MED ORDER — HYDROXYZINE HCL 25 MG PO TABS
25.0000 mg | ORAL_TABLET | Freq: Four times a day (QID) | ORAL | Status: DC | PRN
  Administered 2014-07-22 – 2014-07-31 (×9): 25 mg via ORAL
  Filled 2014-07-21: qty 1
  Filled 2014-07-21: qty 20
  Filled 2014-07-21 (×8): qty 1

## 2014-07-21 MED ORDER — PANTOPRAZOLE SODIUM 40 MG PO TBEC
40.0000 mg | DELAYED_RELEASE_TABLET | Freq: Every day | ORAL | Status: DC
  Administered 2014-07-21 – 2014-08-01 (×12): 40 mg via ORAL
  Filled 2014-07-21 (×7): qty 1
  Filled 2014-07-21: qty 14
  Filled 2014-07-21 (×8): qty 1

## 2014-07-21 MED ORDER — INFLUENZA VAC SPLIT QUAD 0.5 ML IM SUSY
0.5000 mL | PREFILLED_SYRINGE | INTRAMUSCULAR | Status: AC
  Administered 2014-07-22: 0.5 mL via INTRAMUSCULAR
  Filled 2014-07-21: qty 0.5

## 2014-07-21 MED ORDER — METFORMIN HCL 500 MG PO TABS
500.0000 mg | ORAL_TABLET | Freq: Every evening | ORAL | Status: DC
  Administered 2014-07-21 – 2014-07-27 (×7): 500 mg via ORAL
  Filled 2014-07-21 (×10): qty 1

## 2014-07-21 MED ORDER — ATENOLOL 50 MG PO TABS
100.0000 mg | ORAL_TABLET | Freq: Every day | ORAL | Status: DC
  Administered 2014-07-22 – 2014-08-02 (×12): 100 mg via ORAL
  Filled 2014-07-21 (×6): qty 1
  Filled 2014-07-21: qty 28
  Filled 2014-07-21: qty 1
  Filled 2014-07-21: qty 2
  Filled 2014-07-21 (×6): qty 1

## 2014-07-21 MED ORDER — ALBUTEROL SULFATE HFA 108 (90 BASE) MCG/ACT IN AERS
2.0000 | INHALATION_SPRAY | Freq: Four times a day (QID) | RESPIRATORY_TRACT | Status: DC | PRN
  Administered 2014-07-22 – 2014-08-01 (×7): 2 via RESPIRATORY_TRACT
  Filled 2014-07-21: qty 6.7

## 2014-07-21 MED ORDER — HYDROCHLOROTHIAZIDE 25 MG PO TABS
25.0000 mg | ORAL_TABLET | Freq: Every day | ORAL | Status: DC
  Administered 2014-07-22 – 2014-08-02 (×12): 25 mg via ORAL
  Filled 2014-07-21: qty 14
  Filled 2014-07-21 (×14): qty 1

## 2014-07-21 MED ORDER — LORATADINE 10 MG PO TABS
10.0000 mg | ORAL_TABLET | Freq: Every day | ORAL | Status: DC
  Administered 2014-07-22 – 2014-08-02 (×12): 10 mg via ORAL
  Filled 2014-07-21 (×11): qty 1
  Filled 2014-07-21: qty 14
  Filled 2014-07-21 (×3): qty 1

## 2014-07-21 NOTE — Progress Notes (Signed)
Accepted to [facility] bed 505-1 per Debarah Crape [facility].   Bedelia Person, M.S., LPCA, [facility] Licensed Professional Counselor Associate  Triage Specialist  [facility]  Therapeutic Triage Services Phone: [phone] Fax: [phone]

## 2014-07-21 NOTE — BHH Counselor (Signed)
TTS Counselor faxed referral to the following facilities in effort to obtain inpt placement:   [facility] [place]
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
## 2014-07-21 NOTE — BHH Counselor (Signed)
Pt has been accepted to [facility] 503-2 per Debarah Crape [facility]. Accepting doctor: Dr. Thomasena Edis. Can come over after 12p.   Bedelia Person, M.S., LPCA, [facility] Licensed Professional Counselor Associate  Triage Specialist  [facility]  Therapeutic Triage Services Phone: [phone] Fax: [phone]

## 2014-07-21 NOTE — BH Assessment (Addendum)
[facility] Assessment Progress Note   Per Corena Pilgrim, MD, this pt requires psychiatric hospitalization.  Debarah Crape, RN, [facility] has assigned pt to Rm 503-2 with stipulation that pt is not to be transferred until after 12:00. Pt has signed Consent to Release Information to [place], and a notification call has been placed. Pt is under IVC; IVC paperwork and consent form have been faxed to [facility].  Pt's nurse, Anderson Malta, has been informed and she agrees to send original paperwork along with pt via [employer], and to call report to [phone].  Jalene Mullet, MA Triage Specialist 07/21/2014 @ 11:28  Addendum:  Pt's assigned room has been changed to 505-1.  Jalene Mullet, MA Triage Specialist 07/21/2014 @ 14:50

## 2014-07-21 NOTE — Consult Note (Signed)
[facility] Consult   Reason for Consult:  Depression, Suicidal ideation, homicidal ideation Referring Physician:  EDP Patient Identification: Maureen Swanson MRN:  [id number] Principal Diagnosis: Major depressive disorder, recurrent severe without psychotic features Diagnosis:   Patient Active Problem List   Diagnosis Date Noted  . Major depressive disorder, recurrent severe without psychotic features [F33.2] 07/21/2014    Priority: High  . Depression with suicidal ideation [F32.9, R45.851] 02/18/2014  . Depression [F32.9] 02/17/2014  . 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 Cocaine abuse, continuous use [F14.10] 02/19/2012  . Schizoaffective disorder, depressive type [F25.1] 02/19/2012  . Alcohol dependence [F10.20] 06/23/2011  . Cocaine abuse, episodic [F14.10] 06/23/2011  . Knee pain [M25.569] 10/17/2010  . HYPERGLYCEMIA [R73.09] 08/16/2010  . MAMMOGRAM, ABNORMAL, RIGHT [R92.8] 08/05/2010  . HYPERLIPIDEMIA [E78.5] 06/21/2010  . AMENORRHEA, SECONDARY [N91.2] 10/29/2009  . 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 HELICOBACTER PYLORI INFECTION [B96.81] 04/02/2009  . GERD [K21.9] 10/11/2007  . HYPERTENSION [I10] 02/26/2007    Total Time spent with patient: 45 minutes  Subjective:   Maureen Swanson is a 49 y.o. female patient admitted with Recurrent Major depression, Suicidal ideation, homicidal ideation.Marland Kitchen  HPI: AA female, 49 years old was seen today for feeling suicidal and having homicidal ideation towards her ex boyfriend.  Patient reported that she took over dose of her Seroquel and Trileptal yesterday to kill herself.  She reported previously cutting her wrist in the past.  Patient admitted to using Marijuana and Cocaine occasionally.  Patient was hospitalized last year at our [facility] last year for treatment of depression and Cocaine use.  Patient reported increased feelings of depressive  Symptoms; insomnia, feeling hopeless, helpless and stated that she is  homeless.  Patient also reported that she has not been taking her medications and gave no reasons for not.  Patient sees a provider at [facility] [place].  Patient admitted to diagnosis of Bipolar disorder and Chronic Depression.  Patient cannot contract for safety at this time.   She denies AVH.  Patient have been accepted to our in patient Psychiatric unit and will be transferred as soon as transportation is available.   HPI Elements:   Location:  Major depressive disorder, OD on her medications yesterday morning. Quality:  feeling hopeless, helpless,suicidal, homicidal towards ex boyfriend. Severity:  severe. Timing:  Acute. Duration:  Chronic mental illness. Context:  Seeking help for depression.  Past Medical History:  Past Medical History  Diagnosis Date  . High cholesterol   . Depression   . Gout   . Anxiety   . Bipolar 1 disorder   . GERD (gastroesophageal reflux disease)   . Substance abuse     crack cocaine  . Headache(784.0)     migraines  . TMJ (temporomandibular joint disorder)   . Asthma     daily and prn inhalers  . Arthritis     back and knees  . Gastric ulcer   . 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 Hypertension     under control, has been on med. x 12 yrs.  . Diabetes mellitus     diet-controlled    Past Surgical History  Procedure Laterality Date  . Knee arthroscopy w/ partial medial meniscectomy  05/01/2010    right  . Breast lumpectomy with needle localization  04/19/2012    Procedure: BREAST LUMPECTOMY WITH NEEDLE LOCALIZATION;  Surgeon: [name], MD;  Location: [place];  Service:  General;  Laterality: Right;   Family History:  Family History  Problem Relation Age of Onset  . Diabetes Mother   . Cancer Mother     breast  . Cancer Father    Social History:  History  Alcohol Use No    Comment: no longer drinking     History  Drug Use  . 1.00 per week  . Special: Cocaine    Comment:  last use 08/02/2012    History   Social History  . Marital Status: Single    Spouse Name: N/A    Number of Children: N/A  . Years of Education: N/A   Social History Main Topics  . Smoking status: Former Smoker -- 15 years    Quit date: 08/02/2012  . Smokeless tobacco: Never Used     Comment: quit smoking 02/2012  . Alcohol Use: No     Comment: no longer drinking  . Drug Use: 1.00 per week    Special: Cocaine     Comment: last use 08/02/2012  . Sexual Activity: No   Other Topics Concern  . Not on file   Social History Narrative   Additional Social History:    Pain Medications: Pt denies Prescriptions: Seroquel, trileptal Over the Counter: Pt denies History of alcohol / drug use?: Yes Name of Substance 1: Crack cocaine 1 - Age of First Use: 22 1 - Amount (size/oz): $40  1 - Frequency: 2x months 1 - Duration: ongoing 1 - Last Use / Amount: 07/19/14 Name of Substance 2: alcohol 2 - Age of First Use: 16 2 - Amount (size/oz): 3 40 oz  2 - Frequency: Every other day 2 - Duration: ongoing 2 - Last Use / Amount: 07/20/14                 Allergies:   Allergies  Allergen Reactions  . Chocolate Hives  . Orange Hives    "Acid foods"  . Penicillins Hives  . Tomato Hives    "acid foods"    Vitals: Blood pressure 129/96, pulse 72, temperature 98.4 F (36.9 C), temperature source Oral, resp. rate 16, SpO2 98 %.  Risk to Self: Suicidal Ideation: Yes-Currently Present Suicidal Intent: Yes-Currently Present Is patient at risk for suicide?: Yes Suicidal Plan?: Yes-Currently Present Specify Current Suicidal Plan: To overdose Access to Means: Yes Specify Access to Suicidal Means: Access to drugs What has been your use of drugs/alcohol within the last 12 months?: Crack cocaine 2x a month and alcohol and every other day How many times?: 4 Other Self Harm Risks: None Triggers for Past Attempts: Unknown Intentional Self Injurious Behavior: None Risk to Others: Homicidal  Ideation: Yes-Currently Present Thoughts of Harm to Others: Yes-Currently Present Comment - Thoughts of Harm to Others: Desires to harm ex-boyfriend Current Homicidal Intent: Yes-Currently Present Current Homicidal Plan: No Access to Homicidal Means: No Identified Victim: Ronald-ex-boyfriend History of harm to others?: No Assessment of Violence: None Noted Violent Behavior Description: None Does patient have access to weapons?: No Criminal Charges Pending?: No Does patient have a court date: No Prior Inpatient Therapy: Prior Inpatient Therapy: Yes Prior Therapy Dates: 2015 Prior Therapy Facilty/Provider(s): [facility] Reason for Treatment: Depression Prior Outpatient Therapy: Prior Outpatient Therapy: Yes Prior Therapy Dates: 2016 Prior Therapy Facilty/Provider(s): [facility] Reason for Treatment: Depression  Current Facility-Administered Medications  Medication Dose Route Frequency Provider Last Rate Last Dose  . albuterol (PROVENTIL HFA;VENTOLIN HFA) 108 (90 BASE) MCG/ACT inhaler 2 puff  2 puff Inhalation Q6H PRN Ovid Curd  Rita Ohara, MD      . atenolol (TENORMIN) tablet 100 mg  100 mg Oral Daily Jasper Riling. Pickering, MD   100 mg at 07/21/14 0935  . hydrochlorothiazide (HYDRODIURIL) tablet 25 mg  25 mg Oral Daily Jasper Riling. Pickering, MD   25 mg at 07/21/14 0934  . hydrOXYzine (ATARAX/VISTARIL) tablet 25 mg  25 mg Oral Q6H PRN Jasper Riling. Pickering, MD   25 mg at 07/20/14 2117  . loratadine (CLARITIN) tablet 10 mg  10 mg Oral Daily Jasper Riling. Pickering, MD   10 mg at 07/21/14 0934  . metFORMIN (GLUCOPHAGE) tablet 500 mg  500 mg Oral QPM Nathan R. Pickering, MD   500 mg at 07/20/14 2030  . pantoprazole (PROTONIX) EC tablet 40 mg  40 mg Oral QHS Nathan R. Alvino Chapel, MD   40 mg at 07/20/14 2114   Current Outpatient Prescriptions  Medication Sig Dispense Refill  . albuterol (PROVENTIL HFA;VENTOLIN HFA) 108 (90 BASE) MCG/ACT inhaler Inhale 2 puffs into the lungs every 6 (six) hours as needed for  wheezing or shortness of breath.    Marland Kitchen atenolol (TENORMIN) 100 MG tablet Take 1 tablet (100 mg total) by mouth daily. For hypertension    . celecoxib (CELEBREX) 100 MG capsule Take 100 mg by mouth 2 (two) times daily.    . cetirizine (ZYRTEC) 10 MG tablet Take 10 mg by mouth daily.    . Diclofenac Sodium CR 100 MG 24 hr tablet Take 100 mg by mouth 2 (two) times daily as needed for pain.    . hydrochlorothiazide (HYDRODIURIL) 25 MG tablet Take 1 tablet (25 mg total) by mouth daily. For hypertension    . metFORMIN (GLUCOPHAGE) 500 MG tablet Take 500 mg by mouth every evening.    Marland Kitchen omeprazole (PRILOSEC) 20 MG capsule Take 20 mg by mouth daily.    . OXcarbazepine (TRILEPTAL) 150 MG tablet Take 150 mg by mouth 2 (two) times daily.    [place] Calcium 500 MG TABS Take 1 tablet by mouth daily.    . QUEtiapine (SEROQUEL XR) 400 MG 24 hr tablet Take 2 tablets (800 mg total) by mouth at bedtime. For mood control (Patient taking differently: Take 400-800 mg by mouth at bedtime. For mood control) 60 tablet 0  . benztropine (COGENTIN) 1 MG tablet Take 1 tablet (1 mg total) by mouth 2 (two) times daily. For prevention of drug induced involuntary movement (Patient not taking: Reported on 07/02/2014) 60 tablet 0  . buPROPion (WELLBUTRIN SR) 200 MG 12 hr tablet Take 1 tablet (200 mg total) by mouth 2 (two) times daily. For depression (Patient not taking: Reported on 07/02/2014) 60 tablet 0  . hydrALAZINE (APRESOLINE) 25 MG tablet Take 1 tablet (25 mg total) by mouth 3 (three) times daily. For hypertension (Patient not taking: Reported on 07/02/2014)    . hydrOXYzine (ATARAX/VISTARIL) 25 MG tablet Take 1 tablet (25 mg total) by mouth every 6 (six) hours as needed for anxiety. (Patient not taking: Reported on 07/02/2014) 45 tablet 0  . lamoTRIgine (LAMICTAL) 25 MG tablet Take 10 tablets (250 mg total) by mouth daily. For mood stabilization (Patient not taking: Reported on 07/02/2014) 300 tablet 0  . loratadine  (CLARITIN) 10 MG tablet Take 1 tablet (10 mg total) by mouth daily. (May be purchased from over the counter): For allergies (Patient not taking: Reported on 07/02/2014)    . Oxcarbazepine (TRILEPTAL) 600 MG tablet Take 1 tablet (600 mg total) by mouth 2 (two) times  daily. For mood stabilization (Patient not taking: Reported on 07/20/2014) 60 tablet 0  . pantoprazole (PROTONIX) 40 MG tablet Take 1 tablet (40 mg total) by mouth at bedtime. For acid reflux (Patient not taking: Reported on 07/02/2014)    . polyethylene glycol powder (GLYCOLAX/MIRALAX) powder Take 17 g by mouth 2 (two) times daily. Until daily soft stools  OTC (Patient not taking: Reported on 07/02/2014) 119 g 0  . traMADol (ULTRAM) 50 MG tablet Take 1 tablet (50 mg total) by mouth every 6 (six) hours as needed. (Patient not taking: Reported on 07/20/2014) 15 tablet 0  . traZODone (DESYREL) 100 MG tablet Take 1 tablet (100 mg total) by mouth at bedtime as needed for sleep. (Patient not taking: Reported on 07/02/2014) 30 tablet 0    Musculoskeletal: Strength & Muscle Tone: within normal limits Gait & Station: normal Patient leans: N/A  Psychiatric Specialty Exam:     Blood pressure 129/96, pulse 72, temperature 98.4 F (36.9 C), temperature source Oral, resp. rate 16, SpO2 98 %.There is no weight on file to calculate BMI.  General Appearance: Casual  Eye Contact::  Good  Speech:  Clear and Coherent and Normal Rate  Volume:  Normal  Mood:  Anxious, Depressed, Hopeless, Worthless and helpless  Affect:  Congruent, Depressed and Flat  Thought Process:  Coherent, Goal Directed and Intact  Orientation:  Full (Time, Place, and Person)  Thought Content:  WDL  Suicidal Thoughts:  Yes.  with intent/plan  Homicidal Thoughts:  Yes.  without intent/plan  Memory:  Immediate;   Good Recent;   Good Remote;   Good  Judgement:  Impaired  Insight:  Shallow  Psychomotor Activity:  Normal  Concentration:  Fair  Recall:  NA  Fund of  Knowledge:Fair  Language: Good  Akathisia:  NA  Handed:  Right  AIMS (if indicated):     Assets:  Desire for Improvement  ADL's:  Intact  Cognition: WNL  Sleep:      Medical Decision Making: Review and summation of old records (2), Review of Medication Regimen & Side Effects (2) and Review of New Medication or Change in Dosage (2)  Treatment Plan Summary: Daily contact with patient to assess and evaluate symptoms and progress in treatment, Medication management and Plan Accepted at [place]:  Recommend psychiatric Inpatient admission when medically cleared. Accepted at [facility] waiting for bed assignment Disposition: Admit to [facility]  Charmaine Downs, C   PMHNP-BC Patient seen, evaluated and I agree with notes by Nurse Practitioner. Corena Pilgrim, MD 07/21/2014 11:28 AM

## 2014-07-21 NOTE — ED Notes (Signed)
Patient complains of increase anxiety. Respirations equal and unlabored, skin warm and dry. NAD. Patient will be medicated per MAR. 

## 2014-07-21 NOTE — ED Notes (Signed)
Patient has a flat affect on approach today. Reports that she took an overdose of medications this week. Continues to report having suicidal ideations and increased depression. Wanting continued help from ED. Possible placement to an inpatient unit.

## 2014-07-21 NOTE — ED Notes (Signed)
Patient going to [facility] for continued treatment. Cooperative with staff and police department.

## 2014-07-21 NOTE — Tx Team (Signed)
Initial Interdisciplinary Treatment Plan   PATIENT STRESSORS: Financial difficulties Health problems Substance abuse   PATIENT STRENGTHS: Communication skills Supportive family/friends   PROBLEM LIST: Problem List/Patient Goals Date to be addressed Date deferred Reason deferred Estimated date of resolution  Safety/Suicide Risk 07/21/2014     Substance Abuse 07/21/2014     Depression "8/10" 07/21/2014     "Taking medications when I'm suppose to." 07/21/2014     "Get overrall better feeling for myself." 07/21/2014                              DISCHARGE CRITERIA:  Adequate post-discharge living arrangements Improved stabilization in mood, thinking, and/or behavior Verbal commitment to aftercare and medication compliance Withdrawal symptoms are absent or subacute and managed without 24-hour nursing intervention  PRELIMINARY DISCHARGE PLAN: Attend aftercare/continuing care group Attend 12-step recovery group Placement in alternative living arrangements  PATIENT/FAMIILY INVOLVEMENT: This treatment plan has been presented to and reviewed with the patient, LASHEENA FRIEZE.  The patient and family have been given the opportunity to ask questions and make suggestions.  Charlyne Quale A 07/21/2014, 5:57 PM

## 2014-07-22 DIAGNOSIS — R45851 Suicidal ideations: Secondary | ICD-10-CM

## 2014-07-22 DIAGNOSIS — F1420 Cocaine dependence, uncomplicated: Secondary | ICD-10-CM

## 2014-07-22 DIAGNOSIS — F332 Major depressive disorder, recurrent severe without psychotic features: Secondary | ICD-10-CM

## 2014-07-22 LAB — GLUCOSE, CAPILLARY
GLUCOSE-CAPILLARY: 127 mg/dL — AB (ref 70–99)
Glucose-Capillary: 112 mg/dL — ABNORMAL HIGH (ref 70–99)

## 2014-07-22 MED ORDER — IBUPROFEN 600 MG PO TABS
600.0000 mg | ORAL_TABLET | Freq: Four times a day (QID) | ORAL | Status: DC | PRN
  Administered 2014-07-22 – 2014-08-01 (×12): 600 mg via ORAL
  Filled 2014-07-22 (×13): qty 1

## 2014-07-22 MED ORDER — OXCARBAZEPINE 300 MG PO TABS
300.0000 mg | ORAL_TABLET | Freq: Two times a day (BID) | ORAL | Status: DC
  Administered 2014-07-22 – 2014-07-24 (×4): 300 mg via ORAL
  Filled 2014-07-22 (×8): qty 1

## 2014-07-22 MED ORDER — OXCARBAZEPINE 300 MG PO TABS
600.0000 mg | ORAL_TABLET | Freq: Two times a day (BID) | ORAL | Status: DC
  Filled 2014-07-22 (×2): qty 2

## 2014-07-22 MED ORDER — QUETIAPINE FUMARATE ER 200 MG PO TB24
200.0000 mg | ORAL_TABLET | Freq: Two times a day (BID) | ORAL | Status: DC
  Administered 2014-07-22 – 2014-07-24 (×4): 200 mg via ORAL
  Filled 2014-07-22 (×10): qty 1
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
## 2014-07-22 NOTE — Progress Notes (Signed)
Patient seen on day room watching TV. Patient reports chronic pain on right leg but refused any pain medication offered. Endorses SI with no plan at this time. Patient said that she hears voices but cannot explain the voice. Patient requested for her blood sugar checked in the morning and at bed time stated "I checked it at home". Manus Gunning NP notified. Ordered CBG monitoring twice/dayat 00:30am. Due medications given as ordered. Will continue to monitor for safety and stability.
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
## 2014-07-22 NOTE — BHH Group Notes (Signed)
[place] Group Notes:  (Nursing/MHT/Case Management/Adjunct)  Date:  07/22/2014  Time:  9:30am  Type of Therapy:  Nurse Education  - Self Inventory Review & Healthy Coping Skills  Participation Level:  Active  Participation Quality:  Appropriate  Affect:  Flat  Cognitive:  Appropriate  Insight:  Improving  Engagement in Group:  Engaged  Modes of Intervention:  Discussion, Education and Support  Summary of Progress/Problems: patient participated in group displaying improving insight into her goals after discharge and what coping skills she needs to employ. Recognized that she needs to stay on her medications and stay away from those who are using drugs.  Jamie Kato 07/22/2014, 3:46 PM

## 2014-07-22 NOTE — Progress Notes (Signed)
Patient ID: Maureen Swanson, female   DOB: [birth year], 49 y.o.   MRN: [id number] R-States she will be glad when she is 66, asked her to explain that and she said because at that point she thinks she has done enough.continues to think of suicide, and at 49 yo with her health problems she will have felt like she has lived long enough. She acknowledges she has three great kids and 4 grandchildren that are all doing well and live nearby for which she is proud. States she had a right lumpectomy for breast cancer and they didn't get it all and she is worried about that. States her mom died at 34 from cancer. She continues to be sad and have thoughts to hurt self but can contract for safety. A-Pleasant to speak with but depressed. Attended both am groups. Verbal and appropriate. R-Interacts with peers. No problems voiced.
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
## 2014-07-22 NOTE — Progress Notes (Signed)
Maureen Swanson  Participation Quality:  Appropriate  Affect:  Appropriate  Cognitive:  Appropriate  Insight:  Appropriate  Engagement in Group:  Engaged  Modes of Intervention:  Education  Summary of Progress/Problems: The patient shared in group that she had an "all right" day. She attributes her positive day to being able to sit down and talk to the nursing student for a long period of time. She also mentioned that she wrote in her journal as a means of coping. As a theme for the day, her coping skill is to listen to some music.   Archie Balboa S 07/22/2014, 9:13 PM
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
## 2014-07-23 LAB — GLUCOSE, CAPILLARY
GLUCOSE-CAPILLARY: 135 mg/dL — AB (ref 70–99)
Glucose-Capillary: 144 mg/dL — ABNORMAL HIGH (ref 70–99)
Glucose-Capillary: 177 mg/dL — ABNORMAL HIGH (ref 70–99)

## 2014-07-23 MED ORDER — CELECOXIB 100 MG PO CAPS
100.0000 mg | ORAL_CAPSULE | Freq: Two times a day (BID) | ORAL | Status: DC
  Administered 2014-07-23 – 2014-08-02 (×21): 100 mg via ORAL
  Filled 2014-07-23 (×15): qty 1
  Filled 2014-07-23: qty 28
  Filled 2014-07-23 (×7): qty 1
  Filled 2014-07-23: qty 28
  Filled 2014-07-23: qty 1

## 2014-07-23 NOTE — Progress Notes (Signed)
D: Pt was laying down during the assessment. Informed the writer that she's glad she came in because she was "really thinking of doing something crazy". Stated, "she knows she needs to talk about what happened but she's trying to block it". Writer encouraged the pt to speak with her doctor and discuss her issues.   A:  Support and encouragement was offered. 15 min checks continued for safety.  R: Pt remains safe.

## 2014-07-23 NOTE — BHH Group Notes (Signed)
[facility] Group Notes:  (Nursing/MHT/Case Management/Adjunct)  Date:  07/23/2014  Time:  0930am  Type of Therapy:  Self Inventory Group with RN  Participation Level:  Active  Participation Quality:  Appropriate and Attentive  Affect:  Blunted and Depressed  Cognitive:  Alert and Appropriate  Insight:  Appropriate  Engagement in Group:  Engaged  Modes of Intervention:  Discussion, Education and Support  Summary of Progress/Problems: Patient attended group, remained engaged, and responded appropriately. Pt reports her depression 9/10 and anxiety 8/10. Pt states "I feel like hurting myself."   Jimmye Norman, [place] A 07/23/2014, 10:28 AM

## 2014-07-23 NOTE — Progress Notes (Signed)
Psychoeducational Group Note  Date: 07/23/2014 Time: 1030  Group Topic/Focus:  Making Healthy Choices:   The focus of this group is to help patients identify negative/unhealthy choices they were using prior to admission and identify positive/healthier coping strategies to replace them upon discharge.  Participation Level:  Active  Participation Quality:  Appropriate  Affect:  Flat  Cognitive:  Appropriate  Insight:  Engaged  Engagement in Group:    Additional Comments:    07/23/2014,3:59 PM Alyannah Sanks, Trixie Rude

## 2014-07-23 NOTE — BHH Group Notes (Signed)
[facility] Group Notes: (Clinical Social Work)   07/23/2014      Type of Therapy:  Group Therapy   Participation Level:  Did Not Attend - at ED   Selmer Dominion, LCSW 07/23/2014, 2:15 PM

## 2014-07-23 NOTE — Progress Notes (Signed)
[facility] MD Progress Note  07/23/2014 11:37 AM Maureen Swanson  MRN:  [id number] Subjective:  Depressed  History of Preset Illness: AA female, 48 years at admission reported  feeling suicidal and having homicidal ideation towards her ex boyfriend. Patient reported that she took over dose of her Seroquel and Trileptal yesterday to kill herself. She reported previously cutting her wrist in the past. Patient admitted to using Marijuana and Cocaine occasionally. Patient was hospitalized last year at our [facility] last year for treatment of depression and Cocaine use. Patient reported increased feelings of depressive Symptoms; insomnia, feeling hopeless, helpless and stated that she is homeless. Patient also reported that she has not been taking her medications and gave no reasons for not."  On evaluation today. Still feels depressed, not hopeless or suicidal but feels down, decrease energy and blah". Complaint of joint pains. Uses crutches to walk. No hallucinations Does not talk much about her drug use complicating her depression. Remains worried about her being homeless as big stress and factor for depression and using drugs.   Principal Problem: <principal problem not specified> Diagnosis:   Patient Active Problem List   Diagnosis Date Noted  . Cocaine use disorder, moderate, dependence [F14.20]   . 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 MDD (major depressive disorder), recurrent severe, without psychosis [F33.2] 07/21/2014  . Substance abuse [F19.10]   . Suicide attempt by drug ingestion [T50.902A]   . Depression with suicidal ideation [F32.9, R45.851] 02/18/2014  . Depression [F32.9] 02/17/2014  . 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 Cocaine abuse, continuous use [F14.10] 02/19/2012  . Schizoaffective disorder, depressive type [F25.1] 02/19/2012  . Alcohol dependence [F10.20] 06/23/2011  . Cocaine abuse, episodic [F14.10]  06/23/2011  . Knee pain [M25.569] 10/17/2010  . HYPERGLYCEMIA [R73.09] 08/16/2010  . MAMMOGRAM, ABNORMAL, RIGHT [R92.8] 08/05/2010  . HYPERLIPIDEMIA [E78.5] 06/21/2010  . AMENORRHEA, SECONDARY [N91.2] 10/29/2009  . 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 HELICOBACTER PYLORI INFECTION [B96.81] 04/02/2009  . GERD [K21.9] 10/11/2007  . HYPERTENSION [I10] 02/26/2007   Total Time spent with patient: 30 minutes   Past Medical History:  Past Medical History  Diagnosis Date  . High cholesterol   . Depression   . Gout   . Anxiety   . Bipolar 1 disorder   . GERD (gastroesophageal reflux disease)   . Substance abuse     crack cocaine  . Headache(784.0)     migraines  . TMJ (temporomandibular joint disorder)   . Asthma     daily and prn inhalers  . Arthritis     back and knees  . Gastric ulcer   . 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 Hypertension     under control, has been on med. x 12 yrs.  . Diabetes mellitus     diet-controlled    Past Surgical History  Procedure Laterality Date  . Knee arthroscopy w/ partial medial meniscectomy  05/01/2010    right  . Breast lumpectomy with needle localization  04/19/2012    Procedure: BREAST LUMPECTOMY WITH NEEDLE LOCALIZATION;  Surgeon: [name], MD;  Location: [facility];  Service: General;  Laterality: Right;   Family History:  Family History  Problem Relation Age of Onset  . Diabetes Mother   . Cancer Mother     breast  . Cancer Father    Social History:  History  Alcohol Use  . 0.0 oz/week  Comment: "4-40 ounces/day"     History  Drug Use  . 1.00 per week  . Special: Cocaine    Comment: last use 08/02/2012    History   Social History  . Marital Status: Single    Spouse Name: N/A    Number of Children: N/A  . Years of Education: N/A   Social History Main Topics  . Smoking status: Former Smoker -- 15 years    Quit date: 08/02/2012  . Smokeless tobacco: Never Used      Comment: quit smoking 02/2012  . Alcohol Use: 0.0 oz/week     Comment: "4-40 ounces/day"  . Drug Use: 1.00 per week    Special: Cocaine     Comment: last use 08/02/2012  . Sexual Activity: No   Other Topics Concern  . None   Social History Narrative   Additional History:    Sleep: Fair  Appetite:  Fair   Assessment: Depression complicated with circumstances and drugs use. Still poor insight about triggers and drug use.  Musculoskeletal: Strength & Muscle Tone: within normal limits Gait & Station: with crutches Patient leans: ambulates slow   Psychiatric Specialty Exam: Physical Exam  Constitutional: She appears well-developed and well-nourished.  HENT:  Head: Normocephalic.    Review of Systems  Respiratory: Negative for cough.   Cardiovascular: Negative for chest pain.  Gastrointestinal: Negative for nausea.  Skin: Negative for rash.  Psychiatric/Behavioral: Positive for depression. The patient is nervous/anxious.     Blood pressure 147/97, pulse 66, temperature 98.7 F (37.1 C), temperature source Oral, resp. rate 16, height 5' 3.5" (1.613 m), weight 98.884 kg (218 lb), SpO2 100 %.Body mass index is 38.01 kg/(m^2).  General Appearance: Casual  Eye Contact::  Fair  Speech:  Slow  Volume:  Decreased  Mood:  Depressed and Dysphoric  Affect:  Congruent and Constricted  Thought Process:  Coherent  Orientation:  Full (Time, Place, and Person)  Thought Content:  Rumination  Suicidal Thoughts:  No  Homicidal Thoughts:  No  Memory:  Immediate;   Fair Recent;   Fair  Judgement:  Poor  Insight:  Shallow  Psychomotor Activity:  Decreased  Concentration:  Fair  Recall:  [employer] of Knowledge:Fair  Language: Fair  Akathisia:  Negative  Handed:  Right  AIMS (if indicated):     Assets:  Leisure Time Others:  poor social support as a poor asset  ADL's:  Intact  Cognition: WNL  Sleep:        Current Medications: Current Facility-Administered Medications   Medication Dose Route Frequency Provider Last Rate Last Dose  . albuterol (PROVENTIL HFA;VENTOLIN HFA) 108 (90 BASE) MCG/ACT inhaler 2 puff  2 puff Inhalation Q6H PRN Delfin Gant, NP   2 puff at 07/22/14 1810  . atenolol (TENORMIN) tablet 100 mg  100 mg Oral Daily Delfin Gant, NP   100 mg at 07/23/14 0933  . hydrochlorothiazide (HYDRODIURIL) tablet 25 mg  25 mg Oral Daily Delfin Gant, NP   25 mg at 07/23/14 0933  . hydrOXYzine (ATARAX/VISTARIL) tablet 25 mg  25 mg Oral Q6H PRN Delfin Gant, NP   25 mg at 07/22/14 2056  . ibuprofen (ADVIL,MOTRIN) tablet 600 mg  600 mg Oral Q6H PRN Janett Labella, NP   600 mg at 07/22/14 2210  . loratadine (CLARITIN) tablet 10 mg  10 mg Oral Daily Delfin Gant, NP   10 mg at 07/23/14 0933  . metFORMIN (GLUCOPHAGE) tablet 500 mg  500 mg Oral QPM Delfin Gant, NP   500 mg at 07/22/14 1811  . Oxcarbazepine (TRILEPTAL) tablet 300 mg  300 mg Oral BID Merian Capron, MD   300 mg at 07/23/14 0933  . pantoprazole (PROTONIX) EC tablet 40 mg  40 mg Oral QHS Delfin Gant, NP   40 mg at 07/22/14 2057  . QUEtiapine (SEROQUEL XR) 24 hr tablet 200 mg  200 mg Oral BID Merian Capron, MD   200 mg at 07/23/14 7253    Lab Results:  Results for orders placed or performed during the hospital encounter of 07/21/14 (from the past 48 hour(s))  Glucose, capillary     Status: Abnormal   Collection Time: 07/22/14  6:15 AM  Result Value Ref Range   Glucose-Capillary 112 (H) 70 - 99 mg/dL  Glucose, capillary     Status: Abnormal   Collection Time: 07/22/14  5:31 PM  Result Value Ref Range   Glucose-Capillary 127 (H) 70 - 99 mg/dL   Comment 1 Notify RN   Glucose, capillary     Status: Abnormal   Collection Time: 07/23/14  6:26 AM  Result Value Ref Range   Glucose-Capillary 135 (H) 70 - 99 mg/dL   Comment 1 Notify RN     Physical Findings: AIMS: Facial and Oral Movements Muscles of Facial Expression: None, normal Lips and Perioral  Area: None, normal Jaw: None, normal Tongue: None, normal,Extremity Movements Upper (arms, wrists, hands, fingers): None, normal Lower (legs, knees, ankles, toes): None, normal, Trunk Movements Neck, shoulders, hips: None, normal, Overall Severity Severity of abnormal movements (highest score from questions above): None, normal Incapacitation due to abnormal movements: None, normal Patient's awareness of abnormal movements (rate only patient's report): No Awareness, Dental Status Current problems with teeth and/or dentures?: No Does patient usually wear dentures?: No  CIWA:  CIWA-Ar Total: 3 COWS:  COWS Total Score: 2  Treatment Plan Summary: Daily contact with patient to assess and evaluate symptoms and progress in treatment and Medication management  Continue seroquel 200mg  bid Continue trileptal 300mg  bid.  Consider inreaseing trileptal in a day or two.    Medical Decision Making:  Review of Psycho-Social Stressors (1), Review of Last Therapy Session (1), Independent Review of image, tracing or specimen (2) and Review of Medication Regimen & Side Effects (2)     Raygan Skarda 07/23/2014, 11:37 AM

## 2014-07-23 NOTE — Progress Notes (Addendum)
Maureen Swanson remains to herself, quiet and non descript. She avoids making eye contact. She does not ask staff to do anything for her and  She does not initiate conversation.   A She takes all of her scheduled meds according to schedule. She is started on celebrex, per her request for her generalized complaints of hurting and not feeling comfortable. She completed her morning assessment sheet and on it she wrote she has experienced positive SI within the past 24 hrs but she contracts for safety easily with this nurse.  and she rates her depression, hopelessness and anxiety " 8/8/8", respectively.   R She states she is still experiencing audit halluc, but states " they are going away". Safety in place.

## 2014-07-23 NOTE — Progress Notes (Signed)
Patient ID: Maureen Swanson, female   DOB: 02-May-1966, 49 y.o.   MRN: [id number] D: client pleasant, in dayroom watching TV, comments on the game, interacts appropriately with staff and peers. Client reports decreased pain at "7" of 10, follow up after medications were given, noted that she has problems with knees and feet. Client uses a cane for support. Client reports depression at "8" of 10, notes when asked if any harm thoughts "it comes and goesScientist, product/process development for safety. A: Writer introduced self to client, provided emotional support, reviewed medication, administered as ordered. Staff will monitor q43min for safety. R: Client is safe on the unit.

## 2014-07-24 DIAGNOSIS — F1099 Alcohol use, unspecified with unspecified alcohol-induced disorder: Secondary | ICD-10-CM

## 2014-07-24 DIAGNOSIS — F250 Schizoaffective disorder, bipolar type: Secondary | ICD-10-CM

## 2014-07-24 DIAGNOSIS — F251 Schizoaffective disorder, depressive type: Secondary | ICD-10-CM | POA: Diagnosis present

## 2014-07-24 LAB — GLUCOSE, CAPILLARY: Glucose-Capillary: 151 mg/dL — ABNORMAL HIGH (ref 70–99)

## 2014-07-24 MED ORDER — OXCARBAZEPINE 300 MG PO TABS
600.0000 mg | ORAL_TABLET | Freq: Every day | ORAL | Status: DC
  Administered 2014-07-24 – 2014-07-25 (×2): 600 mg via ORAL
  Filled 2014-07-24 (×3): qty 2

## 2014-07-24 MED ORDER — QUETIAPINE FUMARATE ER 400 MG PO TB24
400.0000 mg | ORAL_TABLET | Freq: Every day | ORAL | Status: DC
  Filled 2014-07-24 (×2): qty 1

## 2014-07-24 MED ORDER — QUETIAPINE FUMARATE ER 200 MG PO TB24
200.0000 mg | ORAL_TABLET | Freq: Every day | ORAL | Status: AC
  Administered 2014-07-24: 200 mg via ORAL
  Filled 2014-07-24: qty 1

## 2014-07-24 MED ORDER — OXCARBAZEPINE 300 MG PO TABS
300.0000 mg | ORAL_TABLET | Freq: Every day | ORAL | Status: DC
  Administered 2014-07-25 – 2014-07-26 (×2): 300 mg via ORAL
  Filled 2014-07-24 (×3): qty 1

## 2014-07-24 MED ORDER — ESZOPICLONE 2 MG PO TABS
1.0000 mg | ORAL_TABLET | Freq: Every day | ORAL | Status: DC
  Administered 2014-07-24 – 2014-08-01 (×9): 1 mg via ORAL
  Filled 2014-07-24 (×9): qty 1

## 2014-07-24 MED ORDER — LORAZEPAM 1 MG PO TABS: 1.0000 mg | ORAL_TABLET | ORAL | Status: DC | PRN

## 2014-07-24 MED ORDER — NON FORMULARY: 1.0000 mg | Freq: Every day | Status: DC

## 2014-07-24 NOTE — Tx Team (Signed)
  Interdisciplinary Treatment Plan Update   Date Reviewed:  07/24/2014  Time Reviewed:  8:20 AM  Progress in Treatment:   Attending groups: Inconsistent Participating in groups: Yes Taking medication as prescribed: Yes  Tolerating medication: Yes Family/Significant other contact made: No Patient understands diagnosis: Yes AEB asking for help with depression Discussing patient identified problems/goals with staff: Yes  See initial care plan Medical problems stabilized or resolved: Yes Denies suicidal/homicidal ideation: Yes  In tx team Patient has not harmed self or others: Yes  For review of initial/current patient goals, please see plan of care.  Estimated Length of Stay:  4-5 days  Reason for Continuation of Hospitalization: Depression Medication stabilization Suicidal ideation  Homocidal ideation  New Problems/Goals identified:  N/A  Discharge Plan or Barriers:   return home, follow up outpt  Additional Comments:  Maureen Swanson, 49 years old was admitted for feeling suicidal and having homicidal ideation towards her ex boyfriend. Patient reported that she took over dose of her Seroquel and Trileptal yesterday to kill herself. She reported previously cutting her wrist in the past. Patient admitted to using Marijuana and Cocaine occasionally. Patient was hospitalized last year at our [facility] last year for treatment of depression and Cocaine use. Patient reported increased feelings of depressive Symptoms; insomnia, feeling hopeless, helpless and stated that she is homeless. Patient also reported that she has not been taking her medications and gave no reasons for not. Patient sees a provider at [facility] [place]. Patient admitted to diagnosis of Bipolar disorder and Chronic Depression. Patient cannot contract for safety at this time. She denies AVH.  Patient continues to remain depressed and hopeless. Says she feels no one is helping her. Shallow insight into her  diagnosis and says she does drink but not regularly and uses cocaine infrequently.  Says slept fairly, energy is down and mood remains blah".   Trileptal, Seroquel trial  Attendees:  Signature: Steva Colder, MD 07/24/2014 8:20 AM   Signature: Ripley Fraise, LCSW 07/24/2014 8:20 AM  Signature: Elmarie Shiley, NP 07/24/2014 8:20 AM  Signature: Mayra Neer, RN 07/24/2014 8:20 AM  Signature: Darrol Angel, RN 07/24/2014 8:20 AM  Signature:  07/24/2014 8:20 AM  Signature:   07/24/2014 8:20 AM  Signature:    Signature:    Signature:    Signature:    Signature:    Signature:      Scribe for Treatment Team:   Ripley Fraise, LCSW  07/24/2014 8:20 AM
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
## 2014-07-24 NOTE — BHH Group Notes (Signed)
[facility] LCSW Group Therapy  07/24/2014 1:15 pm  Type of Therapy: Process Group Therapy  Participation Level: Invited.  Chose to not attend  Summary of Progress/Problems: Today's group addressed the issue of overcoming obstacles.  Patients were asked to identify their biggest obstacle post d/c that stands in the way of their on-going success, and then problem solve as to how to manage this.  [name] 07/24/2014   4:41 PM

## 2014-07-24 NOTE — Plan of Care (Signed)
Problem: Alteration in mood Goal: LTG-Pt's behavior demonstrates decreased signs of depression (Patient's behavior demonstrates decreased signs of depression to the point the patient is safe to return home and continue treatment in an outpatient setting)  Outcome: Progressing Client interacts appropriately with staff and peers, watches TV tonight, appears relaxed, reports depression at 46/10.

## 2014-07-24 NOTE — Progress Notes (Signed)
[facility] MD Progress Note  07/24/2014 12:58 PM JAMMI MORRISSETTE  MRN:  [id number] Subjective:  Patient states "I am depressed and I feel suicidal.'  Objective:  Patient is an [place] female with past hx of schizoaffective disorder as well as substance use do (multiple) presented feeling suicidal and having homicidal ideation towards her ex boyfriend. Patient reported that she took over dose of her Seroquel and Trileptal yesterday to kill herself. She reported previously cutting her wrist in the past.  Patient seen this AM. Patient discussed with treatment team. Patient is an elderly obese woman ,walks with the assistance of a cane. She is calm and cooperative today, but continues to be depressed. She reports her medications as not fully effective , since they were started on a low dose ,due to her recent OD. Patient with AH which are mumbling as well as SI . Patient contracts for safety.  Patient with very labile mood and irritability on and off.  Patient with hx of cocaine , etoh as well as cannabis abuse. Her UDS was positive for cocaine on admission, BAL -128. Patient did not endorse any withdrawal sx. VS reviewed.  Patient with several psychosocial stressors like recent homelessness, relational struggle with boyfriend with whom she used to live with, multiple medical issues.      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  Schizoaffective disorder, depressive type [F25.1] 07/24/2014  . Cocaine use disorder, moderate, dependence [F14.20]   . Depression [F32.9] 02/17/2014  . 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 Knee pain [M25.569] 10/17/2010  . HYPERGLYCEMIA [R73.09] 08/16/2010  . MAMMOGRAM, ABNORMAL, RIGHT [R92.8] 08/05/2010  . HYPERLIPIDEMIA [E78.5] 06/21/2010  . AMENORRHEA, SECONDARY [N91.2] 10/29/2009  . 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 HELICOBACTER PYLORI INFECTION [B96.81] 04/02/2009  . GERD [K21.9] 10/11/2007  . HYPERTENSION [I10] 02/26/2007   Total Time spent with patient: 30 minutes   Past Medical History:  Past Medical History  Diagnosis Date  . High cholesterol   . Depression   . Gout   . Anxiety   . Bipolar 1 disorder   . GERD (gastroesophageal reflux disease)   . Substance abuse     crack cocaine  . Headache(784.0)     migraines  . TMJ (temporomandibular joint disorder)   . Asthma     daily and prn inhalers  . Arthritis     back and knees  . Gastric ulcer   . 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 Hypertension     under control, has been on med. x 12 yrs.  . Diabetes mellitus     diet-controlled    Past Surgical History  Procedure Laterality Date  . Knee arthroscopy w/ partial medial meniscectomy  05/01/2010    right  . Breast lumpectomy with needle localization  04/19/2012    Procedure: BREAST LUMPECTOMY WITH NEEDLE LOCALIZATION;  Surgeon: [name], MD;  Location: [place];  Service: General;  Laterality: Right;  Family History:  Family History  Problem Relation Age of Onset  . Diabetes Mother   . Cancer Mother     breast  . Cancer Father    Social History:  History  Alcohol Use  . 0.0 oz/week    Comment: "4-40 ounces/day"     History  Drug Use  . 1.00 per week  . Special: Cocaine    Comment: last use 08/02/2012    History   Social History  . Marital Status: Single    Spouse Name: N/A     Number of Children: N/A  . Years of Education: N/A   Social History Main Topics  . Smoking status: Former Smoker -- 15 years    Quit date: 08/02/2012  . Smokeless tobacco: Never Used     Comment: quit smoking 02/2012  . Alcohol Use: 0.0 oz/week     Comment: "4-40 ounces/day"  . Drug Use: 1.00 per week    Special: Cocaine     Comment: last use 08/02/2012  . Sexual Activity: No   Other Topics Concern  . None   Social History Narrative   Additional History:    Sleep: Fair  Appetite:  Fair   Musculoskeletal: Strength & Muscle Tone: within normal limits Gait & Station: with cane  Patient leans: ambulates slow   Psychiatric Specialty Exam: Physical Exam  Constitutional: She appears well-developed and well-nourished.  HENT:  Head: Normocephalic.    Review of Systems  Psychiatric/Behavioral: Positive for depression, suicidal ideas, hallucinations and substance abuse. The patient is nervous/anxious and has insomnia.     Blood pressure 121/81, pulse 98, temperature 98.6 F (37 C), temperature source Oral, resp. rate 18, height 5' 3.5" (1.613 m), weight 98.884 kg (218 lb), SpO2 100 %.Body mass index is 38.01 kg/(m^2).  General Appearance: Casual  Eye Contact::  Minimal  Speech:  Slow  Volume:  Decreased  Mood:  Depressed and Dysphoric  Affect:  Depressed  Thought Process:  Coherent  Orientation:  Full (Time, Place, and Person)  Thought Content:  Hallucinations: Auditory  Suicidal Thoughts:  Yes.  without intent/plan  Homicidal Thoughts:  No  Memory:  Immediate;   Fair Recent;   Fair  Judgement:  Poor  Insight:  Shallow  Psychomotor Activity:  Decreased  Concentration:  Fair  Recall:  [employer] of Knowledge:Fair  Language: Fair  Akathisia:  Negative  Handed:  Right  AIMS (if indicated):     Assets:  Others:  access to health care   ADL's:  Intact  Cognition: WNL  Sleep:  Number of Hours: 6     Current Medications: Current Facility-Administered  Medications  Medication Dose Route Frequency Provider Last Rate Last Dose  . albuterol (PROVENTIL HFA;VENTOLIN HFA) 108 (90 BASE) MCG/ACT inhaler 2 puff  2 puff Inhalation Q6H PRN Delfin Gant, NP   2 puff at 07/22/14 1810  . atenolol (TENORMIN) tablet 100 mg  100 mg Oral Daily Delfin Gant, NP   100 mg at 07/24/14 0803  . celecoxib (CELEBREX) capsule 100 mg  100 mg Oral BID Merian Capron, MD   100 mg at 07/24/14 0804  . eszopiclone (LUNESTA) tablet 1 mg  1 mg Oral QHS Yakira Duquette, MD      . hydrochlorothiazide (HYDRODIURIL) tablet 25 mg  25 mg Oral Daily Delfin Gant, NP   25 mg at 07/24/14 0804  . hydrOXYzine (ATARAX/VISTARIL) tablet 25 mg  25 mg Oral Q6H PRN Delfin Gant, NP   25  mg at 07/22/14 2056  . ibuprofen (ADVIL,MOTRIN) tablet 600 mg  600 mg Oral Q6H PRN Janett Labella, NP   600 mg at 07/22/14 2210  . loratadine (CLARITIN) tablet 10 mg  10 mg Oral Daily Delfin Gant, NP   10 mg at 07/24/14 0804  . LORazepam (ATIVAN) tablet 1 mg  1 mg Oral Q4H PRN [name], MD      . metFORMIN (GLUCOPHAGE) tablet 500 mg  500 mg Oral QPM Delfin Gant, NP   500 mg at 07/23/14 1836  . [START ON 07/25/2014] Oxcarbazepine (TRILEPTAL) tablet 300 mg  300 mg Oral Daily Miyonna Ormiston, MD      . Oxcarbazepine (TRILEPTAL) tablet 600 mg  600 mg Oral QHS Yaa Donnellan, MD      . pantoprazole (PROTONIX) EC tablet 40 mg  40 mg Oral QHS Delfin Gant, NP   40 mg at 07/23/14 2150  . QUEtiapine (SEROQUEL XR) 24 hr tablet 200 mg  200 mg Oral BID Merian Capron, MD   200 mg at 07/24/14 0805    Lab Results:  Results for orders placed or performed during the hospital encounter of 07/21/14 (from the past 48 hour(s))  Glucose, capillary     Status: Abnormal   Collection Time: 07/22/14  5:31 PM  Result Value Ref Range   Glucose-Capillary 127 (H) 70 - 99 mg/dL   Comment 1 Notify RN   Glucose, capillary     Status: Abnormal   Collection Time: 07/23/14  6:26 AM  Result  Value Ref Range   Glucose-Capillary 135 (H) 70 - 99 mg/dL   Comment 1 Notify RN   Glucose, capillary     Status: Abnormal   Collection Time: 07/23/14  5:22 PM  Result Value Ref Range   Glucose-Capillary 144 (H) 70 - 99 mg/dL  Glucose, capillary     Status: Abnormal   Collection Time: 07/23/14  8:20 PM  Result Value Ref Range   Glucose-Capillary 177 (H) 70 - 99 mg/dL   Comment 1 Notify RN   Glucose, capillary     Status: Abnormal   Collection Time: 07/24/14  6:17 AM  Result Value Ref Range   Glucose-Capillary 151 (H) 70 - 99 mg/dL    Physical Findings: AIMS: Facial and Oral Movements Muscles of Facial Expression: None, normal Lips and Perioral Area: None, normal Jaw: None, normal Tongue: None, normal,Extremity Movements Upper (arms, wrists, hands, fingers): None, normal Lower (legs, knees, ankles, toes): None, normal, Trunk Movements Neck, shoulders, hips: None, normal, Overall Severity Severity of abnormal movements (highest score from questions above): None, normal Incapacitation due to abnormal movements: None, normal Patient's awareness of abnormal movements (rate only patient's report): No Awareness, Dental Status Current problems with teeth and/or dentures?: No Does patient usually wear dentures?: No  CIWA:  CIWA-Ar Total: 3 COWS:  COWS Total Score: 2   Assessment: Patient is an elderly AAF with hx of schizoaffective disorder, polysubstance abuse, presented with SI as well as HI towards her boyfriend. Will readjust her medications .    Treatment Plan Summary: Daily contact with patient to assess and evaluate symptoms and progress in treatment and Medication management  Will start CIWA TID as well as Ativan prn for CIWA>/= 15. Will increase Trileptal to 900 mg po daily. Will continue Seroquel 200 mg po bid. Could slowly increase the dose ,but would be cautious since she had a recent OD. Reviewed recent EKG - wnl . Will add a small dose of [place]  1 mg po qhs for  sleep. CSW will work on disposition. Patient with multiple medical problems , will continue to monitor. PT consult for walker use.   Medical Decision Making:  Review of Psycho-Social Stressors (1), Review or order clinical lab tests (1), Established Problem, Worsening (2), New Problem, with no additional work-up planned (3), Review of Last Therapy Session (1), Independent Review of image, tracing or specimen (2) and Review of Medication Regimen & Side Effects (2)     Tenika Keeran 07/24/2014, 12:58 PM

## 2014-07-24 NOTE — Plan of Care (Signed)
Problem: Diagnosis: Increased Risk For Suicide Attempt Goal: STG-Patient Will Report Suicidal Feelings to Staff Outcome: Progressing Client report harmful thoughts towards self with no plan "off and on", able to verbally contract for safety.
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
## 2014-07-25 LAB — GLUCOSE, CAPILLARY
GLUCOSE-CAPILLARY: 275 mg/dL — AB (ref 70–99)
Glucose-Capillary: 127 mg/dL — ABNORMAL HIGH (ref 70–99)
Glucose-Capillary: 262 mg/dL — ABNORMAL HIGH (ref 70–99)

## 2014-07-25 MED ORDER — QUETIAPINE FUMARATE ER 400 MG PO TB24
400.0000 mg | ORAL_TABLET | Freq: Every day | ORAL | Status: DC
  Administered 2014-07-25 – 2014-07-26 (×2): 400 mg via ORAL
  Filled 2014-07-25 (×3): qty 1

## 2014-07-25 NOTE — Evaluation (Signed)
Physical Therapy Evaluation Patient Details Name: Maureen Swanson MRN: [id number] DOB: 1965-07-06 Today's Date: 07/25/2014   History of Present Illness  MDD reoccurance , drug abuse, admitted to [facility] 07/26/14.  Clinical Impression  Pt is ambulating with a cane, reports R knee will buckle, Patient may benefit from a Rolling walker at discharge, a 4 wheeled would be  Optimal, but a 2 wheeled would suffice. Patient  Taken out of  Group and allowed to return  To group, will return another day  To instruct in exercises then discharge from PT.   Follow Up Recommendations No PT follow up    Equipment Recommendations  Rolling walker with 5" wheels (or 4 wheeled RW)    Recommendations for Other Services       Precautions / Restrictions Precautions Precautions: Fall      Mobility  Bed Mobility                  Transfers Overall transfer level: Modified independent Equipment used: Straight cane                Ambulation/Gait Ambulation/Gait assistance: Modified independent (Device/Increase time) Ambulation Distance (Feet): 200 Feet Assistive device: Rolling walker (2 wheeled);Straight cane       General Gait Details: ambulated from Day room,with cane,  Stairs            Wheelchair Mobility    Modified Rankin (Stroke Patients Only)       Balance                                             Pertinent Vitals/Pain Pain Assessment: 0-10 Pain Score: 8  Pain Location: R knee Pain Descriptors / Indicators: Aching Pain Intervention(s): Premedicated before session    Home Living Family/patient expects to be discharged to:: Shelter/Homeless Living Arrangements: Alone                    Prior Function Level of Independence: Independent with assistive device(s)         Comments: with a cane     Hand Dominance        Extremity/Trunk Assessment   Upper Extremity Assessment: Overall WFL for tasks assessed            Lower Extremity Assessment: RLE deficits/detail RLE Deficits / Details: decreased   knee extension,       Communication   Communication: No difficulties  Cognition Arousal/Alertness: Awake/alert Behavior During Therapy: Flat affect Overall Cognitive Status: Within Functional Limits for tasks assessed                      General Comments      Exercises        Assessment/Plan    PT Assessment Patient needs continued PT services  PT Diagnosis Abnormality of gait;Acute pain   PT Problem List Decreased strength;Decreased range of motion  PT Treatment Interventions Therapeutic activities;DME instruction   PT Goals (Current goals can be found in the Care Plan section) Acute Rehab PT Goals Patient Stated Goal: to get a RW PT Goal Formulation: With patient Time For Goal Achievement: 07/28/14 Potential to Achieve Goals: Good    Frequency Min 1X/week   Barriers to discharge   homeless    Co-evaluation               End of  Session   Activity Tolerance: Patient tolerated treatment well Patient left:  (going back into day room)      Functional Assessment Tool Used: clinical judgement Functional Limitation: Mobility: Walking and moving around Mobility: Walking and Moving Around Current Status (G9201): At least 1 percent but less than 20 percent impaired, limited or restricted Mobility: Walking and Moving Around Goal Status [phone]): 0 percent impaired, limited or restricted    Time: 1120-1132 PT Time Calculation (min) (ACUTE ONLY): 12 min   Charges:   PT Evaluation $Initial PT Evaluation Tier I: 1 Procedure     PT G Codes:   PT G-Codes **NOT FOR INPATIENT CLASS** Functional Assessment Tool Used: clinical judgement Functional Limitation: Mobility: Walking and moving around Mobility: Walking and Moving Around Current Status (R9758): At least 1 percent but less than 20 percent impaired, limited or restricted Mobility: Walking and Moving Around Goal  Status [phone]): 0 percent impaired, limited or restricted    Claretha Cooper 07/25/2014, 11:52 AM Tresa Endo PT [phone]

## 2014-07-25 NOTE — Progress Notes (Signed)
D: Pt alert, visible in dayroom for majority of this shift. Pt did not attend nursing group at 0900 as scheduled, however, she was present in SW group; till PT staff evalution. Pt observed interacting well with peers in dayroom. Affect flat with depressed mood but pt is pleasant when approached. Pt was assessed by PT earlier this shift, walker given for mobility on / off unit. Fall precaution maintained without incident to note at this time.  A: Medications administered as per MD's orders, including PRN Motrin for c/o right knee pain 8/10. Support and availability offered. Q 15 minutes checks maintained for safety.  R: Pt cooperative with care. Reports VH this shift, stated she saw some spiders this morning, but realized they were unreal. Denied AH / HI. Contracts for safety while hospitalized when assessed for SI, which according to pt comes and goes. Pt remains safe on / off unit without gestures of self injurious behavior to note at this time.

## 2014-07-25 NOTE — [facility] Group Notes (Signed)
[facility] LCSW Group Therapy  07/25/2014 , 12:45 PM   Type of Therapy:  Group Therapy  Participation Level:  Active  Participation Quality:  Attentive  Affect:  Appropriate  Cognitive:  Alert  Insight:  Improving  Engagement in Therapy:  Engaged  Modes of Intervention:  Discussion, Exploration and Socialization  Summary of Progress/Problems: Today's group focused on the term Diagnosis.  Participants were asked to define the term, and then pronounce whether it is a negative, positive or neutral term.  Feels that diagnosis is a good thing because it can lead to solutions to problems, but also pointed out that she does not feel like any medication has been helpful for symptoms.  With a bit of pushing, she admitted that she drinks and uses drugs "sometimes" and those things are not helpful in combination with meds.  Admitted that her treatment plan needs to include "living in a more structured setting because I need more help right now."  Maureen Swanson 07/25/2014 , 12:45 PM

## 2014-07-25 NOTE — Plan of Care (Signed)
Problem: Alteration in mood & ability to function due to Goal: STG-Patient will comply with prescribed medication regimen (Patient will comply with prescribed medication regimen)  Outcome: Progressing Pt compliant with current medication regimen as per MD's order.

## 2014-07-25 NOTE — Progress Notes (Signed)
D: Client was visible on the unit, pleasant cooperative, still unsure about discharge plans at this time. A: Writer provided emotional support encouraged group, reviewed medications. Staff will monitor q57min for safety. R: Client is safe on the unit, attended group.

## 2014-07-25 NOTE — Progress Notes (Signed)
D: Pt was flat in affect and depressed in mood. Pt reports on/off SI with no current plan. Pt verbally contracts for safety. Pt attempted to go to group but arrived after 9. Pt was invited to group at the beginning of the shift.Pt was calm and cooperative this evening. Pt spent about 15 mins outside of her room in the dayroom. Pt reports having AH prior to this writer's shift. AH mumbling in character.  A: Writer administered scheduled medications to pt, per MD orders. Continued support and availability as needed was extended to this pt. Staff continue to monitor pt with q69min checks.  R: No adverse drug reactions noted. Pt receptive to treatment. Pt remains safe at this time.

## 2014-07-25 NOTE — Progress Notes (Signed)
[facility] MD Progress Note  07/25/2014 2:34 PM Maureen Swanson  MRN:  [id number] Subjective:  Patient states "I am still suicidal.'  Objective:  Patient is an [place] female with past hx of schizoaffective disorder as well as substance use do (multiple) presented feeling suicidal and having homicidal ideation towards her ex boyfriend. Patient reported that she took over dose of her Seroquel and Trileptal yesterday to kill herself. She reported previously cutting her wrist in the past.  Patient seen this AM. Patient discussed with treatment team. Patient is an elderly obese woman ,walks with the assistance of a cane. She continues to be depressed as well as hear AH that has been asking her to kill self.Patient continues to endorse SI ,denies any plan ,contracts for safety.  Patient with very labile mood and irritability on and off.  Patient with hx of cocaine , etoh as well as cannabis abuse. Her UDS was positive for cocaine on admission, BAL -128. Patient did not endorse any withdrawal sx. VS reviewed.  Patient with several psychosocial stressors like recent homelessness, relational struggle with boyfriend with whom she used to live with, multiple medical issues.      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 Schizoaffective disorder, depressive type [F25.1] 07/24/2014  . Cocaine use disorder, moderate, dependence [F14.20]   .  Depression [F32.9] 02/17/2014  . 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 Knee pain [M25.569] 10/17/2010  . HYPERGLYCEMIA [R73.09] 08/16/2010  . MAMMOGRAM, ABNORMAL, RIGHT [R92.8] 08/05/2010  . HYPERLIPIDEMIA [E78.5] 06/21/2010  . AMENORRHEA, SECONDARY [N91.2] 10/29/2009  . 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 HELICOBACTER PYLORI INFECTION [B96.81] 04/02/2009  . GERD [K21.9] 10/11/2007  . HYPERTENSION [I10] 02/26/2007   Total Time spent with patient: 30 minutes   Past Medical History:  Past Medical History  Diagnosis Date  . High cholesterol   . Depression   . Gout   . Anxiety   . Bipolar 1 disorder   . GERD (gastroesophageal reflux disease)   . Substance abuse     crack cocaine  . Headache(784.0)     migraines  . TMJ (temporomandibular joint disorder)   . Asthma     daily and prn inhalers  . Arthritis     back and knees  . Gastric ulcer   . 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 Hypertension     under control, has been on med. x 12 yrs.  . Diabetes mellitus     diet-controlled    Past Surgical History  Procedure Laterality Date  . Knee arthroscopy w/ partial medial meniscectomy  05/01/2010    right  . Breast lumpectomy with needle localization  04/19/2012    Procedure: BREAST LUMPECTOMY WITH NEEDLE LOCALIZATION;  Surgeon: [name], MD;  Location: [facility];  Service: General;  Laterality: Right;   Family History:  Family History  Problem Relation Age of Onset  . Diabetes Mother   . Cancer Mother  breast  . Cancer Father    Social History:  History  Alcohol Use  . 0.0 oz/week    Comment: "4-40 ounces/day"     History  Drug Use  . 1.00 per week  . Special: Cocaine    Comment: last use 08/02/2012    History   Social History  . Marital Status: Single    Spouse Name: N/A    Number of Children: N/A  . Years of Education: N/A   Social History Main Topics  . Smoking status: Former Smoker  -- 15 years    Quit date: 08/02/2012  . Smokeless tobacco: Never Used     Comment: quit smoking 02/2012  . Alcohol Use: 0.0 oz/week     Comment: "4-40 ounces/day"  . Drug Use: 1.00 per week    Special: Cocaine     Comment: last use 08/02/2012  . Sexual Activity: No   Other Topics Concern  . None   Social History Narrative   Additional History:    Sleep: Fair  Appetite:  Fair   Musculoskeletal: Strength & Muscle Tone: within normal limits Gait & Station: with cane  Patient leans: ambulates slow   Psychiatric Specialty Exam: Physical Exam  Constitutional: She appears well-developed and well-nourished.  HENT:  Head: Normocephalic.    ROS  Blood pressure 121/80, pulse 80, temperature 98.7 F (37.1 C), temperature source Oral, resp. rate 20, height 5' 3.5" (1.613 m), weight 98.884 kg (218 lb), SpO2 100 %.Body mass index is 38.01 kg/(m^2).  General Appearance: Casual  Eye Contact::  Minimal  Speech:  Slow  Volume:  Decreased  Mood:  Depressed and Dysphoric  Affect:  Depressed  Thought Process:  Coherent  Orientation:  Full (Time, Place, and Person)  Thought Content:  Hallucinations: Auditory, command   Suicidal Thoughts:  Yes.  without intent/plan  Homicidal Thoughts:  No  Memory:  Immediate;   Fair Recent;   Fair  Judgement:  Poor  Insight:  Shallow  Psychomotor Activity:  Decreased  Concentration:  Fair  Recall:  [employer] of Knowledge:Fair  Language: Fair  Akathisia:  Negative  Handed:  Right  AIMS (if indicated):     Assets:  Others:  access to health care   ADL's:  Intact  Cognition: WNL  Sleep:  Number of Hours: 6     Current Medications: Current Facility-Administered Medications  Medication Dose Route Frequency Provider Last Rate Last Dose  . albuterol (PROVENTIL HFA;VENTOLIN HFA) 108 (90 BASE) MCG/ACT inhaler 2 puff  2 puff Inhalation Q6H PRN Delfin Gant, NP   2 puff at 07/24/14 2107  . atenolol (TENORMIN) tablet 100 mg  100 mg Oral  Daily Delfin Gant, NP   100 mg at 07/25/14 0810  . celecoxib (CELEBREX) capsule 100 mg  100 mg Oral BID Merian Capron, MD   100 mg at 07/25/14 0809  . eszopiclone (LUNESTA) tablet 1 mg  1 mg Oral QHS [name], MD   1 mg at 07/24/14 2106  . hydrochlorothiazide (HYDRODIURIL) tablet 25 mg  25 mg Oral Daily Delfin Gant, NP   25 mg at 07/25/14 0810  . hydrOXYzine (ATARAX/VISTARIL) tablet 25 mg  25 mg Oral Q6H PRN Delfin Gant, NP   25 mg at 07/22/14 2056  . ibuprofen (ADVIL,MOTRIN) tablet 600 mg  600 mg Oral Q6H PRN Janett Labella, NP   600 mg at 07/24/14 1641  . loratadine (CLARITIN) tablet 10 mg  10 mg Oral Daily Josephine C  Onuoha, NP   10 mg at 07/25/14 0810  . LORazepam (ATIVAN) tablet 1 mg  1 mg Oral Q4H PRN Cassius Cullinane, MD      . metFORMIN (GLUCOPHAGE) tablet 500 mg  500 mg Oral QPM Delfin Gant, NP   500 mg at 07/24/14 1821  . Oxcarbazepine (TRILEPTAL) tablet 300 mg  300 mg Oral Daily [name], MD   300 mg at 07/25/14 0809  . Oxcarbazepine (TRILEPTAL) tablet 600 mg  600 mg Oral QHS [name], MD   600 mg at 07/24/14 2105  . pantoprazole (PROTONIX) EC tablet 40 mg  40 mg Oral QHS Delfin Gant, NP   40 mg at 07/24/14 2105  . QUEtiapine (SEROQUEL XR) 24 hr tablet 400 mg  400 mg Oral Q2000 [name], MD        Lab Results:  Results for orders placed or performed during the hospital encounter of 07/21/14 (from the past 48 hour(s))  Glucose, capillary     Status: Abnormal   Collection Time: 07/23/14  5:22 PM  Result Value Ref Range   Glucose-Capillary 144 (H) 70 - 99 mg/dL  Glucose, capillary     Status: Abnormal   Collection Time: 07/23/14  8:20 PM  Result Value Ref Range   Glucose-Capillary 177 (H) 70 - 99 mg/dL   Comment 1 Notify RN   Glucose, capillary     Status: Abnormal   Collection Time: 07/24/14  6:17 AM  Result Value Ref Range   Glucose-Capillary 151 (H) 70 - 99 mg/dL    Physical Findings: AIMS: Facial and Oral  Movements Muscles of Facial Expression: None, normal Lips and Perioral Area: None, normal Jaw: None, normal Tongue: None, normal,Extremity Movements Upper (arms, wrists, hands, fingers): None, normal Lower (legs, knees, ankles, toes): None, normal, Trunk Movements Neck, shoulders, hips: None, normal, Overall Severity Severity of abnormal movements (highest score from questions above): None, normal Incapacitation due to abnormal movements: None, normal Patient's awareness of abnormal movements (rate only patient's report): No Awareness, Dental Status Current problems with teeth and/or dentures?: No Does patient usually wear dentures?: No  CIWA:  CIWA-Ar Total: 1 COWS:  COWS Total Score: 2   Assessment: Patient is an elderly AAF with hx of schizoaffective disorder, polysubstance abuse, presented with SI as well as HI towards her boyfriend. Will readjust her medications .    Treatment Plan Summary: Daily contact with patient to assess and evaluate symptoms and progress in treatment and Medication management  Will continue CIWA TID as well as Ativan prn for CIWA>/= 15. Will continue Trileptal 900 mg po daily. Will increase Seroquel to 400 mg po at 8.00 PM - pt requests it to be given at 8 pm . Will continue Luensta 1 mg po qhs for sleep. CSW will work on disposition. Patient with multiple medical problems , will continue to monitor. PT consult for walker use.   Medical Decision Making:  Review of Psycho-Social Stressors (1), Review or order clinical lab tests (1), Established Problem, Worsening (2), New Problem, with no additional work-up planned (3), Review of Last Therapy Session (1), Independent Review of image, tracing or specimen (2) and Review of Medication Regimen & Side Effects (2)     Rashanda Magloire 07/25/2014, 2:34 PM

## 2014-07-25 NOTE — Progress Notes (Signed)
Adult Psychoeducational Group Note  Date:  07/25/2014 Time:  9:35 PM  Group Topic/Focus:  Wrap-Up Group:   The focus of this group is to help patients review their daily goal of treatment and discuss progress on daily workbooks.  Participation Level:  Minimal  Participation Quality:  Appropriate  Affect:  Flat  Cognitive:  Lacking  Insight: Limited  Engagement in Group:  Limited  Modes of Intervention:  Socialization and Support  Additional Comments:  Patient attended and participated in group tonight. She reports that she needs more structure in her life regarding taking her medication, going to her doctor's appointment and everyday life.  She stated that her son called her everyday, but while she has been here he has not called her. Today she went for her meals and attended groups.  Salley Scarlet [facility] 07/25/2014, 9:35 PM

## 2014-07-25 NOTE — Progress Notes (Signed)
Patient ID: Maureen Swanson, female   DOB: 22-Nov-1965, 49 y.o.   MRN: [id number]  Morning Wellness, 09:30am: DID NOT ATTEND  The focus of this group is to educate the patient on the purpose and policies of crisis stabilization and provide a format to answer questions about their admission.  The group details unit policies and expectations of patients while admitted.

## 2014-07-26 LAB — GLUCOSE, CAPILLARY
GLUCOSE-CAPILLARY: 306 mg/dL — AB (ref 70–99)
Glucose-Capillary: 205 mg/dL — ABNORMAL HIGH (ref 70–99)

## 2014-07-26 MED ORDER — OXCARBAZEPINE 300 MG PO TABS
600.0000 mg | ORAL_TABLET | Freq: Two times a day (BID) | ORAL | Status: DC
  Administered 2014-07-26 – 2014-08-02 (×14): 600 mg via ORAL
  Filled 2014-07-26 (×2): qty 2
  Filled 2014-07-26: qty 56
  Filled 2014-07-26 (×4): qty 2
  Filled 2014-07-26: qty 56
  Filled 2014-07-26 (×10): qty 2

## 2014-07-26 NOTE — Progress Notes (Signed)
D: Pt alert, stayed in dayroom for majority of this shift. Off unit to cafeteria with peers and back in dayroom. Pt presents with blank affect and depressed mood.  A: All medications given as ordered including PRN Motrin and Vistaril for pain and anxiety. Support, availability and encouragement offered throughout this shift. Engaged pt in therapeutic discussion of feelings due to pt's report of SI without plan at the beginning of this shift.  Q 15 minutes minutes checks maintained for safety without gestures or event of self harm thus far this shift.  R: Pt receptive to care. Reported SI earlier this shift, rated her depression 10/10 due to her children (especially son) not contacting her back since yesterday. However, pt did contract for safety and requested her PRN Vistaril for anxiety as ordered which she reported was effective. Remains safe on unit and compliant with current treatment regimen.

## 2014-07-26 NOTE — [facility] Group Notes (Signed)
[facility] LCSW Aftercare Discharge Planning Group Note   07/26/2014 4:22 PM  Participation Quality:  Engaged  Mood/Affect:  Depressed  Depression Rating:  9  Anxiety Rating:  8  Thoughts of Suicide:  No Will you contract for safety?   NA  Current AVH:  Yes  Plan for Discharge/Comments:  Had been given two applications for treatment facilities before group, and was working on them during group.  Reports poor sleep, but is also happy with anti-psychotic.  States her symptoms are lessening. Reminds me that she wants a referral to an ACT team.  Transportation Means: bus  Supports: none  [name], [name]

## 2014-07-26 NOTE — Progress Notes (Signed)
Recreation Therapy Notes  INPATIENT RECREATION THERAPY ASSESSMENT  Patient Details Name: Maureen Swanson MRN: [id number] DOB: 1965/08/01 Today's Date: 07/26/2014   Patient reports seeing bugs, spiders and snakes crawling on the floor during assessment interview.   Patient Stressors: Relationship, Other (Comment) (Managing health issues. )   Patient reports argument with boyfriend that resulted in her being kicked out her his home.   Patient reports she has difficulty managing her diabetes and taking her medications as prescribed.   Coping Skills:   Substance Abuse, Music, Other (Comment) (TV)   Patient endorses 1-2x/month crack cocaine use and occasional ETOH use.   Personal Challenges: Anger, Concentration, Decision-Making, Expressing Yourself, Problem-Solving, Self-Esteem/Confidence, Stress Management  Leisure Interests (2+):   (Play with Grandkids, TV)  Awareness of Community Resources:  No  Patient Strengths:  Good listener  Patient Identified Areas of Improvement:  "Ability to take care of important things."  Current Recreation Participation:  Nothing  Patient Goal for Hospitalization:  "Depend on myself." Patient described this as effectively managing her ADL's and medications.   [place] of Residence:  [place] of Residence:  [place]   Current [place] (including self-harm):  Yes  Current HI:  Yes  Consent to Intern Participation: N/A   Lane Hacker, LRT/CTRS 07/26/2014, 9:15 PM

## 2014-07-26 NOTE — Progress Notes (Signed)
Adult Psychoeducational Group Note  Date:  07/26/2014 Time:  10:25 PM  Group Topic/Focus:  Wrap-Up Group:   The focus of this group is to help patients review their daily goal of treatment and discuss progress on daily workbooks.  Participation Level:  Minimal  Participation Quality:  Appropriate  Affect:  Flat  Cognitive:  Oriented  Insight: Limited  Engagement in Group:  Engaged  Modes of Intervention:  Socialization and Support  Additional Comments:  Patient attended and participated in group tonight. She reports having an "OK" day. She went for groups, talk to her Education officer, museum and filled out some applications for placement.  Salley Scarlet [facility] 07/26/2014, 10:25 PM

## 2014-07-26 NOTE — Plan of Care (Signed)
Problem: Alteration in mood Goal: STG-Patient is able to discuss feelings and issues (Patient is able to discuss feelings and issues leading to depression)  Outcome: Progressing Pt verbalized her feelings of being depressed with SI this AM. Therapeutic discussion done with pt and she contracted for safety.

## 2014-07-26 NOTE — Progress Notes (Signed)
[facility] MD Progress Note  07/26/2014 2:28 PM Maureen Swanson  MRN:  [id number] Subjective:  Patient states "I am still depressed and suicidal.'  Objective:  Patient is an [place] female with past hx of schizoaffective disorder as well as substance use do (multiple) presented feeling suicidal and having homicidal ideation towards her ex boyfriend. Patient reported that she took over dose of her Seroquel and Trileptal yesterday to kill herself. She reported previously cutting her wrist in the past.  Patient seen this AM. Patient discussed with treatment team. Patient is an elderly obese woman ,walks with the assistance of a walker .  Patient with very labile mood. Patient with anxiety about her living situation as well as her recent separation from her boyfriend.   Patient reports depressed mood as well as SI. Patient denies AH today.  Patient with hx of cocaine , etoh as well as cannabis abuse. Her UDS was positive for cocaine on admission, BAL -128. Patient did not endorse any withdrawal sx. VS reviewed.       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 Schizoaffective disorder, depressive type [F25.1] 07/24/2014  . Cocaine use disorder, moderate, dependence [F14.20]   . Depression [F32.9] 02/17/2014  . 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 Knee pain [M25.569] 10/17/2010  . HYPERGLYCEMIA [R73.09]  08/16/2010  . MAMMOGRAM, ABNORMAL, RIGHT [R92.8] 08/05/2010  . HYPERLIPIDEMIA [E78.5] 06/21/2010  . AMENORRHEA, SECONDARY [N91.2] 10/29/2009  . 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 HELICOBACTER PYLORI INFECTION [B96.81] 04/02/2009  . GERD [K21.9] 10/11/2007  . HYPERTENSION [I10] 02/26/2007   Total Time spent with patient: 30 minutes   Past Medical History:  Past Medical History  Diagnosis Date  . High cholesterol   . Depression   . Gout   . Anxiety   . Bipolar 1 disorder   . GERD (gastroesophageal reflux disease)   . Substance abuse     crack cocaine  . Headache(784.0)     migraines  . TMJ (temporomandibular joint disorder)   . Asthma     daily and prn inhalers  . Arthritis     back and knees  . Gastric ulcer   . 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 Hypertension     under control, has been on med. x 12 yrs.  . Diabetes mellitus     diet-controlled    Past Surgical History  Procedure Laterality Date  . Knee arthroscopy w/ partial medial meniscectomy  05/01/2010    right  . Breast lumpectomy with needle localization  04/19/2012    Procedure: BREAST LUMPECTOMY WITH NEEDLE LOCALIZATION;  Surgeon: [name], MD;  Location: [place];  Service: General;  Laterality: Right;   Family History:  Family History  Problem Relation Age of Onset  . Diabetes Mother   . Cancer Mother     breast  . Cancer Father    Social History:  History  Alcohol Use  . 0.0 oz/week  Comment: "4-40 ounces/day"     History  Drug Use  . 1.00 per week  . Special: Cocaine    Comment: last use 08/02/2012    History   Social History  . Marital Status: Single    Spouse Name: N/A  . Number of Children: N/A  . Years of Education: N/A   Social History Main Topics  . Smoking status: Former Smoker -- 15 years    Quit date: 08/02/2012  . Smokeless tobacco: Never Used     Comment: quit smoking 02/2012  . Alcohol Use: 0.0 oz/week     Comment:  "4-40 ounces/day"  . Drug Use: 1.00 per week    Special: Cocaine     Comment: last use 08/02/2012  . Sexual Activity: No   Other Topics Concern  . None   Social History Narrative   Additional History:    Sleep: Fair  Appetite:  Fair   Musculoskeletal: Strength & Muscle Tone: within normal limits Gait & Station: with cane  Patient leans: ambulates slow   Psychiatric Specialty Exam: Physical Exam  Constitutional: She appears well-developed and well-nourished.  HENT:  Head: Normocephalic.    ROS  Blood pressure 139/91, pulse 92, temperature 98.6 F (37 C), temperature source Oral, resp. rate 20, height 5' 3.5" (1.613 m), weight 98.884 kg (218 lb), SpO2 100 %.Body mass index is 38.01 kg/(m^2).  General Appearance: Casual  Eye Contact::  Minimal  Speech:  Slow  Volume:  Decreased  Mood:  Depressed and Dysphoric  Affect:  Depressed  Thought Process:  Coherent  Orientation:  Full (Time, Place, and Person)  Thought Content:  Rumination  Suicidal Thoughts:  Yes.  without intent/plan  Homicidal Thoughts:  No  Memory:  Immediate;   Fair Recent;   Fair  Judgement:  Poor  Insight:  Shallow  Psychomotor Activity:  Decreased  Concentration:  Fair  Recall:  [employer] of Knowledge:Fair  Language: Fair  Akathisia:  Negative  Handed:  Right  AIMS (if indicated):     Assets:  Others:  access to health care   ADL's:  Intact  Cognition: WNL  Sleep:  Number of Hours: 6     Current Medications: Current Facility-Administered Medications  Medication Dose Route Frequency Provider Last Rate Last Dose  . albuterol (PROVENTIL HFA;VENTOLIN HFA) 108 (90 BASE) MCG/ACT inhaler 2 puff  2 puff Inhalation Q6H PRN Delfin Gant, NP   2 puff at 07/24/14 2107  . atenolol (TENORMIN) tablet 100 mg  100 mg Oral Daily Delfin Gant, NP   100 mg at 07/26/14 0801  . celecoxib (CELEBREX) capsule 100 mg  100 mg Oral BID Merian Capron, MD   100 mg at 07/26/14 0801  . eszopiclone  (LUNESTA) tablet 1 mg  1 mg Oral QHS Nancie Bocanegra, MD   1 mg at 07/25/14 2131  . hydrochlorothiazide (HYDRODIURIL) tablet 25 mg  25 mg Oral Daily Delfin Gant, NP   25 mg at 07/26/14 0801  . hydrOXYzine (ATARAX/VISTARIL) tablet 25 mg  25 mg Oral Q6H PRN Delfin Gant, NP   25 mg at 07/26/14 0819  . ibuprofen (ADVIL,MOTRIN) tablet 600 mg  600 mg Oral Q6H PRN Janett Labella, NP   600 mg at 07/26/14 6644  . loratadine (CLARITIN) tablet 10 mg  10 mg Oral Daily Delfin Gant, NP   10 mg at 07/26/14 0802  . LORazepam (ATIVAN) tablet 1 mg  1 mg Oral Q4H PRN [name], MD      .  metFORMIN (GLUCOPHAGE) tablet 500 mg  500 mg Oral QPM Delfin Gant, NP   500 mg at 07/25/14 1712  . Oxcarbazepine (TRILEPTAL) tablet 600 mg  600 mg Oral BID [name], MD      . pantoprazole (PROTONIX) EC tablet 40 mg  40 mg Oral QHS Delfin Gant, NP   40 mg at 07/25/14 2203  . QUEtiapine (SEROQUEL XR) 24 hr tablet 400 mg  400 mg Oral Q2000 [name], MD   400 mg at 07/25/14 2008    Lab Results:  Results for orders placed or performed during the hospital encounter of 07/21/14 (from the past 48 hour(s))  Glucose, capillary     Status: Abnormal   Collection Time: 07/24/14  4:38 PM  Result Value Ref Range   Glucose-Capillary 275 (H) 70 - 99 mg/dL   Comment 1 Documented in Chart    Comment 2 Notify RN   Glucose, capillary     Status: Abnormal   Collection Time: 07/25/14  6:23 AM  Result Value Ref Range   Glucose-Capillary 127 (H) 70 - 99 mg/dL  Glucose, capillary     Status: Abnormal   Collection Time: 07/25/14  4:47 PM  Result Value Ref Range   Glucose-Capillary 262 (H) 70 - 99 mg/dL   Comment 1 Document in Chart    Comment 2 Repeat Test   Glucose, capillary     Status: Abnormal   Collection Time: 07/26/14  6:23 AM  Result Value Ref Range   Glucose-Capillary 205 (H) 70 - 99 mg/dL    Physical Findings: AIMS: Facial and Oral Movements Muscles of Facial Expression:  None, normal Lips and Perioral Area: None, normal Jaw: None, normal Tongue: None, normal,Extremity Movements Upper (arms, wrists, hands, fingers): None, normal Lower (legs, knees, ankles, toes): None, normal, Trunk Movements Neck, shoulders, hips: None, normal, Overall Severity Severity of abnormal movements (highest score from questions above): None, normal Incapacitation due to abnormal movements: None, normal Patient's awareness of abnormal movements (rate only patient's report): No Awareness, Dental Status Current problems with teeth and/or dentures?: No Does patient usually wear dentures?: No  CIWA:  CIWA-Ar Total: 3 COWS:  COWS Total Score: 2   Assessment: Patient is an elderly AAF with hx of schizoaffective disorder, polysubstance abuse, presented with SI as well as command AH . Pt with continued sx of depression as well as SI.    Treatment Plan Summary: Daily contact with patient to assess and evaluate symptoms and progress in treatment and Medication management  Will continue CIWA TID as well as Ativan prn for CIWA>/= 15. Will increase Trileptal to 1200 mg po daily. Will continue Seroquel  400 mg po at 8.00 PM - pt requests it to be given at 8 pm . Will continue Luensta 1 mg po qhs for sleep. CSW will work on disposition.Patient provided with resources for living situation assistance per CSW.Pt to call them today. Patient with multiple medical problems , will continue to monitor. PT consult for walker use.   Medical Decision Making:  Review of Psycho-Social Stressors (1), Review or order clinical lab tests (1), Established Problem, Worsening (2), New Problem, with no additional work-up planned (3), Review of Last Therapy Session (1), Independent Review of image, tracing or specimen (2) and Review of Medication Regimen & Side Effects (2)     Jarrod Mcenery,SarammaMD 07/26/2014, 2:28 PM

## 2014-07-26 NOTE — Progress Notes (Signed)
D: client visible on the unit, seen dayroom watching TV, interacts appropriately with staff and peers. Client concerned that roommate will keep her awake tonight. Client reports anxiety decreased to "6" of 10. Client says she like groups "it gives me structure, feel like I'm accomplished something"  A: Writer provide emotional support, encouraged client to continue groups and speak to physician about discharge plans. Staff will monitor q10min for safety. R: Client is safe on the unit, attended group.

## 2014-07-26 NOTE — [facility] Group Notes (Signed)
[place] LCSW Group Therapy  07/26/2014 1:35 PM  Type of Therapy:  Group Therapy  Participation Level:  Minimal  Participation Quality:  Drowsy  Affect:  Flat  Cognitive:  Appropriate  Insight:  Limited  Engagement in Therapy:  Limited  Modes of Intervention:  Discussion, Socialization and Support  Summary of Progress/Problems: [employer] ([place]) speaker came to talk about his personal journey with substance abuse and mental illness. Group members were challenged to process ways by which to relate to the speaker. [place] speaker provided handouts and educational information pertaining to groups and services offered by the [facility]. Krystal attended group and stayed the entire time. She fell asleep a few times but made effort to stay awake.     Hyatt,Candace 07/26/2014, 1:35 PM

## 2014-07-26 NOTE — Plan of Care (Signed)
Problem: Alteration in mood & ability to function due to Goal: STG-Patient will attend groups Outcome: Progressing Pt visible in dayroom for scheduled groups this shift.

## 2014-07-26 NOTE — Progress Notes (Signed)
D:  Pt endorses SI / HI- to kill boyfriend. Pt endorses AVH- voices and spiders. Pt is pleasant and cooperative.   A: Pt was offered support and encouragement. Pt was given scheduled medications. Pt was encourage to attend groups. Q 15 minute checks were done for safety.   R:Pt attends groups and interacts well with peers and staff. Pt is taking medication. Pt receptive to treatment and safety maintained on unit.

## 2014-07-27 LAB — GLUCOSE, CAPILLARY
GLUCOSE-CAPILLARY: 288 mg/dL — AB (ref 70–99)
Glucose-Capillary: 127 mg/dL — ABNORMAL HIGH (ref 70–99)

## 2014-07-27 MED ORDER — QUETIAPINE FUMARATE ER 300 MG PO TB24
500.0000 mg | ORAL_TABLET | Freq: Every day | ORAL | Status: DC
  Administered 2014-07-27: 500 mg via ORAL
  Filled 2014-07-27 (×2): qty 1

## 2014-07-27 NOTE — Progress Notes (Signed)
Patient ID: Maureen Swanson, female   DOB: 1965-08-18, 49 y.o.   MRN: [id number]  D: Patient reports depression hasn't improved much since admission. Gives depression, anxiety, and hopelessness "8" on scales. Reports SI still bad but contracts. Trying to get into long term treatment. Has two interviews set up. I feel SI will improve if patient has a placement option but her situation is making her feel hopeless.  A: Staff will monitor on q 15 minute checks, follow treatment plan, and give meds as ordered. R: Cooperative on the unit. Taking meds as prescribed.

## 2014-07-27 NOTE — Progress Notes (Signed)
[place] Group Notes:  (Nursing/MHT/Case Management/Adjunct)  Date:  07/27/2014  Time:  11:24 PM  Type of Therapy:  Psychoeducational Skills  Participation Level:  Active  Participation Quality:  Appropriate  Affect:  Appropriate  Cognitive:  Appropriate  Insight:  Appropriate  Engagement in Group:  Engaged  Modes of Intervention:  Education  Summary of Progress/Problems: The patient indicated that she had a good day overall. She states that she made a number of phone calls for secure housing. In addition, she brought up that she will be having a telephone interview tomorrow for housing. Finally, the patient mentioned that she was seen by physical therapy today for her legs. As a theme for the day, her lifestyle change will involve have "better structure" in her life.   Archie Balboa S 07/27/2014, 11:24 PM

## 2014-07-27 NOTE — Progress Notes (Signed)
Physical Therapy Treatment Patient Details Name: HOLLIN CREWE MRN: [id number] DOB: 1966-03-11 Today's Date: 07/27/2014    History of Present Illness MDD reoccurance , drug abuse, admitted to [facility] 07/26/14.    PT Comments    Patient  Is noted to have weakness of R le > L. Instructed in  Exercises for both legs. Instructed to perform 2 x day x 5-10 repetitions. Written program provided.PT will f/u on 2/15 if remains in facility.  Follow Up Recommendations  No PT follow up     Equipment Recommendations  Rolling walker with 5" wheels (or 4 wheeled)    Recommendations for Other Services       Precautions / Restrictions Precautions Precautions: Fall    Mobility  Bed Mobility                  Transfers                    Ambulation/Gait     Assistive device: Rolling walker (2 wheeled);Straight cane       General Gait Details: ambulated from Day room, up ad lib with RW.   Stairs            Wheelchair Mobility    Modified Rankin (Stroke Patients Only)       Balance                                    Cognition                            Exercises General Exercises - Lower Extremity Ankle Circles/Pumps: AROM;Both Quad Sets: AROM;Both Long Arc Quad: AROM;Right;5 reps Heel Slides: AROM;Right;5 reps Hip ABduction/ADduction: AROM;Right;5 reps Straight Leg Raises: AROM;Right;5 reps    General Comments        Pertinent Vitals/Pain      Home Living                      Prior Function            PT Goals (current goals can now be found in the care plan section) Acute Rehab PT Goals Time For Goal Achievement: 07/31/14 Progress towards PT goals: Progressing toward goals    Frequency  Min 1X/week    PT Plan Current plan remains appropriate    Co-evaluation             End of Session   Activity Tolerance: Patient tolerated treatment well Patient left:  (going to group w/ RW.)      Time: 1330-1350 PT Time Calculation (min) (ACUTE ONLY): 20 min  Charges:  $Therapeutic Exercise: 8-22 mins                    G Codes:      Claretha Cooper 07/27/2014, 2:31 PM Tresa Endo PT [phone]

## 2014-07-27 NOTE — Progress Notes (Signed)
[facility] MD Progress Note  07/27/2014 1:24 PM Maureen Swanson  MRN:  [id number] Subjective:  Patient states "I am still depressed ."  Objective:  Patient is an [place] female with past hx of schizoaffective disorder as well as substance use do (multiple) presented feeling suicidal and having homicidal ideation towards her ex boyfriend. Patient reported that she took over dose of her Seroquel and Trileptal yesterday to kill herself. She reported previously cutting her wrist in the past.  Patient seen this AM. Patient discussed with treatment team. Patient is an elderly obese woman ,walks with the assistance of a walker .  Patient with continued sx of depression, with sleep difficulty as well as AH and VH. Patient reports that ask her to kill self. She reports seeing spiders ,snakes and other stuff. Patient continues to be hopeless about her situation and wants to get as much help as she needed. Patient with anxiety about her living situation as well as her recent separation from her boyfriend.    Patient with hx of cocaine , etoh as well as cannabis abuse. Her UDS was positive for cocaine on admission, BAL -128. Patient did not endorse any withdrawal sx. VS reviewed.  Patient to be interviewed by [facility] as well as [facility] - CSW working on referral process for substance abuse issues. Pt is motivated to get help.       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 Schizoaffective disorder, depressive type [F25.1] 07/24/2014  . Cocaine use disorder, moderate, dependence [F14.20]   . Depression [F32.9] 02/17/2014  . 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 Knee pain [M25.569] 10/17/2010  . HYPERGLYCEMIA [R73.09] 08/16/2010  . MAMMOGRAM, ABNORMAL, RIGHT [R92.8] 08/05/2010  . HYPERLIPIDEMIA [E78.5] 06/21/2010  . AMENORRHEA, SECONDARY [N91.2] 10/29/2009  . 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 HELICOBACTER PYLORI INFECTION [B96.81] 04/02/2009  . GERD [K21.9] 10/11/2007  . HYPERTENSION [I10] 02/26/2007   Total Time spent with patient: 30 minutes   Past Medical History:  Past Medical History  Diagnosis Date  . High cholesterol   . Depression   . Gout   . Anxiety   . Bipolar 1 disorder   . GERD (gastroesophageal reflux disease)   . Substance abuse     crack cocaine  . Headache(784.0)     migraines  . TMJ (temporomandibular joint disorder)   . Asthma     daily and prn inhalers  . Arthritis     back and knees  . Gastric ulcer   . 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 Hypertension     under control, has been on med. x 12 yrs.  . Diabetes mellitus     diet-controlled    Past Surgical History  Procedure Laterality Date  . Knee arthroscopy w/ partial medial meniscectomy  05/01/2010    right  . Breast lumpectomy with needle localization  04/19/2012    Procedure: BREAST LUMPECTOMY WITH NEEDLE LOCALIZATION;  Surgeon: Luella Cook  III, MD;  Location: [place];  Service: General;  Laterality: Right;   Family History:  Family History  Problem Relation Age of Onset  . Diabetes Mother   . Cancer Mother     breast  . Cancer Father    Social History:  History  Alcohol Use  . 0.0 oz/week    Comment: "4-40 ounces/day"     History  Drug Use  . 1.00 per week  . Special: Cocaine    Comment: last use 08/02/2012    History   Social History  .  Marital Status: Single    Spouse Name: N/A  . Number of Children: N/A  . Years of Education: N/A   Social History Main Topics  . Smoking status: Former Smoker -- 15 years    Quit date: 08/02/2012  . Smokeless tobacco: Never Used     Comment: quit smoking 02/2012  . Alcohol Use: 0.0 oz/week     Comment: "4-40 ounces/day"  . Drug Use: 1.00 per week    Special: Cocaine     Comment: last use 08/02/2012  . Sexual Activity: No   Other Topics Concern  . None   Social History Narrative   Additional History:    Sleep: Fair  Appetite:  Fair   Musculoskeletal: Strength & Muscle Tone: within normal limits Gait & Station: with cane  Patient leans: ambulates slow   Psychiatric Specialty Exam: Physical Exam  Constitutional: She appears well-developed and well-nourished.  HENT:  Head: Normocephalic.    ROS  Blood pressure 134/96, pulse 91, temperature 98.5 F (36.9 C), temperature source Oral, resp. rate 20, height 5' 3.5" (1.613 m), weight 98.884 kg (218 lb), SpO2 100 %.Body mass index is 38.01 kg/(m^2).  General Appearance: Casual  Eye Contact::  Minimal  Speech:  Slow  Volume:  Decreased  Mood:  Depressed and Dysphoric  Affect:  Depressed  Thought Process:  Coherent  Orientation:  Full (Time, Place, and Person)  Thought Content:  Hallucinations: Auditory Visual  Suicidal Thoughts:  Yes.  without intent/plan  Homicidal Thoughts:  No  Memory:  Immediate;   Fair Recent;   Fair  Judgement:  Poor  Insight:  Shallow  Psychomotor Activity:  Decreased  Concentration:  Fair  Recall:  [employer] of Knowledge:Fair  Language: Fair  Akathisia:  Negative  Handed:  Right  AIMS (if indicated):     Assets:  Others:  access to health care   ADL's:  Intact  Cognition: WNL  Sleep:  Number of Hours: 6.5     Current Medications: Current Facility-Administered Medications  Medication Dose Route Frequency Provider Last Rate Last Dose  . albuterol (PROVENTIL HFA;VENTOLIN HFA) 108  (90 BASE) MCG/ACT inhaler 2 puff  2 puff Inhalation Q6H PRN Delfin Gant, NP   2 puff at 07/24/14 2107  . atenolol (TENORMIN) tablet 100 mg  100 mg Oral Daily Delfin Gant, NP   100 mg at 07/27/14 0757  . celecoxib (CELEBREX) capsule 100 mg  100 mg Oral BID Merian Capron, MD   100 mg at 07/27/14 0757  . eszopiclone (LUNESTA) tablet 1 mg  1 mg Oral QHS Shakina Choy, MD   1 mg at 07/26/14 2110  . hydrochlorothiazide (HYDRODIURIL) tablet 25 mg  25 mg Oral Daily Delfin Gant, NP   25 mg at 07/27/14 0757  . hydrOXYzine (ATARAX/VISTARIL) tablet 25 mg  25 mg Oral Q6H PRN Delfin Gant, NP   25 mg at 07/26/14 2111  .  ibuprofen (ADVIL,MOTRIN) tablet 600 mg  600 mg Oral Q6H PRN Janett Labella, NP   600 mg at 07/26/14 2044  . loratadine (CLARITIN) tablet 10 mg  10 mg Oral Daily Delfin Gant, NP   10 mg at 07/27/14 0757  . LORazepam (ATIVAN) tablet 1 mg  1 mg Oral Q4H PRN [name], MD      . metFORMIN (GLUCOPHAGE) tablet 500 mg  500 mg Oral QPM Delfin Gant, NP   500 mg at 07/26/14 1716  . Oxcarbazepine (TRILEPTAL) tablet 600 mg  600 mg Oral BID [name], MD   600 mg at 07/27/14 0757  . pantoprazole (PROTONIX) EC tablet 40 mg  40 mg Oral QHS Delfin Gant, NP   40 mg at 07/26/14 2110  . QUEtiapine (SEROQUEL XR) 24 hr tablet 500 mg  500 mg Oral Q2000 [name], MD        Lab Results:  Results for orders placed or performed during the hospital encounter of 07/21/14 (from the past 48 hour(s))  Glucose, capillary     Status: Abnormal   Collection Time: 07/25/14  4:47 PM  Result Value Ref Range   Glucose-Capillary 262 (H) 70 - 99 mg/dL   Comment 1 Document in Chart    Comment 2 Repeat Test   Glucose, capillary     Status: Abnormal   Collection Time: 07/26/14  6:23 AM  Result Value Ref Range   Glucose-Capillary 205 (H) 70 - 99 mg/dL  Glucose, capillary     Status: Abnormal   Collection Time: 07/26/14  4:39 PM  Result Value Ref Range    Glucose-Capillary 306 (H) 70 - 99 mg/dL  Glucose, capillary     Status: Abnormal   Collection Time: 07/27/14  6:08 AM  Result Value Ref Range   Glucose-Capillary 127 (H) 70 - 99 mg/dL    Physical Findings: AIMS: Facial and Oral Movements Muscles of Facial Expression: None, normal Lips and Perioral Area: None, normal Jaw: None, normal Tongue: None, normal,Extremity Movements Upper (arms, wrists, hands, fingers): None, normal Lower (legs, knees, ankles, toes): None, normal, Trunk Movements Neck, shoulders, hips: None, normal, Overall Severity Severity of abnormal movements (highest score from questions above): None, normal Incapacitation due to abnormal movements: None, normal Patient's awareness of abnormal movements (rate only patient's report): No Awareness, Dental Status Current problems with teeth and/or dentures?: No Does patient usually wear dentures?: No  CIWA:  CIWA-Ar Total: 6 COWS:  COWS Total Score: 2   Assessment: Patient is an elderly AAF with hx of schizoaffective disorder, polysubstance abuse, presented with SI as well as command AH . Pt with continued sx of depression as well as SI.    Treatment Plan Summary: Daily contact with patient to assess and evaluate symptoms and progress in treatment and Medication management  Will continue CIWA TID as well as Ativan prn for CIWA>/= 15. Will continue Trileptal  1200 mg po daily. Will increase Seroquel to  500 mg po at 8.00 PM - pt requests it to be given at 8 pm . Will continue Luensta 1 mg po qhs for sleep. CSW will work on disposition.Patient with upcoming interviews for [facility]. Patient with multiple medical problems , will continue to monitor.   Medical Decision Making:  Review of Psycho-Social Stressors (1), Review or order clinical lab tests (1), Established Problem, Worsening (2), New Problem, with no additional work-up planned (3), Review of Last Therapy Session (1), Independent Review of image,  tracing or  specimen (2) and Review of Medication Regimen & Side Effects (2)     Maureen Swanson,SarammaMD 07/27/2014, 1:24 PM

## 2014-07-27 NOTE — [facility] Group Notes (Signed)
[place] Group Notes:  (Counselor/Nursing/MHT/Case Management/Adjunct)  07/27/2014 1:15PM  Type of Therapy:  Group Therapy  Participation Level:  Active  Participation Quality:  Appropriate  Affect:  Flat  Cognitive:  Oriented  Insight:  Improving  Engagement in Group:  Limited  Engagement in Therapy:  Limited  Modes of Intervention:  Discussion, Exploration and Socialization  Summary of Progress/Problems: The topic for group was balance in life.  Pt participated in the discussion about when their life was in balance and out of balance and how this feels.  Pt discussed ways to get back in balance and short term goals they can work on to get where they want to be. You don't want to be around me when I get angry."  Talked about trying to remove herself from negative situations, with mixed results.  Appeared to be carrying on a conversation in her head.  When asked directly, stated that she cannot stop thinking about hurting her ex.  Even though she knows that it's for the best that they broke up, she still wants "some revenge or something, I don't know."   [name] 07/27/2014 1:00 PM

## 2014-07-27 NOTE — [facility] Group Notes (Signed)
[facility] Group Notes:  (Nursing/MHT/Case Management/Adjunct)  Date:  07/27/2014  Time:  1000  Type of Therapy:  Nurse Education  Participation Level:  Active  Participation Quality:  Appropriate and Attentive  Affect:  Appropriate  Cognitive:  Alert and Appropriate  Insight:  Appropriate  Engagement in Group:  Engaged  Modes of Intervention:  Problem-solving and Support  Summary of Progress/Problems: Set goals and went over unit rules  Franciso Bend 07/27/2014, 1:31 PM

## 2014-07-27 NOTE — Tx Team (Signed)
  Interdisciplinary Treatment Plan Update   Date Reviewed:  07/27/2014  Time Reviewed:  8:27 AM  Progress in Treatment:   Attending groups: Yes Participating in groups: Yes Taking medication as prescribed: Yes  Tolerating medication: Yes Family/Significant other contact made: Yes  Patient understands diagnosis: Yes  Discussing patient identified problems/goals with staff: Yes  See initial care plan Medical problems stabilized or resolved: Yes Denies suicidal/homicidal ideation: Yes  In tx team Patient has not harmed self or others: Yes  For review of initial/current patient goals, please see plan of care.  Estimated Length of Stay:  4-5 days  Reason for Continuation of Hospitalization: Depression Hallucinations Homicidal ideation Suicidal ideation  New Problems/Goals identified:  N/A  Discharge Plan or Barriers:   Hoping to get into rehab/living situation  Additional Comments:  Patient with continued sx of depression, with sleep difficulty as well as AH and VH. Patient reports voices telling  her to kill self. She reports seeing spiders ,snakes and other insects Patient continues to be hopeless about her situation and wants to get as much help as she needed. Patient with anxiety about her living situation as well as her recent separation from her boyfriend. Continues with HI towards ex.  She filled out applications for 2 different programs, and remains desperate to get into some sort of rehab/housing.  Attendees:  Signature: Steva Colder, MD 07/27/2014 8:27 AM   Signature: Ripley Fraise, LCSW 07/27/2014 8:27 AM  Signature:  07/27/2014 8:27 AM  Signature: Aldine Contes, RN 07/27/2014 8:27 AM  Signature:  07/27/2014 8:27 AM  Signature:  07/27/2014 8:27 AM  Signature:   07/27/2014 8:27 AM  Signature:    Signature:    Signature:    Signature:    Signature:    Signature:      Scribe for Treatment Team:   Ripley Fraise, LCSW  07/27/2014 8:27 AM

## 2014-07-28 DIAGNOSIS — F1490 Cocaine use, unspecified, uncomplicated: Secondary | ICD-10-CM

## 2014-07-28 DIAGNOSIS — F251 Schizoaffective disorder, depressive type: Principal | ICD-10-CM

## 2014-07-28 LAB — GLUCOSE, CAPILLARY
GLUCOSE-CAPILLARY: 185 mg/dL — AB (ref 70–99)
GLUCOSE-CAPILLARY: 160 mg/dL — AB (ref 70–99)
Glucose-Capillary: 447 mg/dL — ABNORMAL HIGH (ref 70–99)

## 2014-07-28 MED ORDER — INSULIN ASPART 100 UNIT/ML ~~LOC~~ SOLN
0.0000 [IU] | Freq: Three times a day (TID) | SUBCUTANEOUS | Status: DC
  Administered 2014-07-29: 11 [IU] via SUBCUTANEOUS
  Administered 2014-07-29 (×2): 7 [IU] via SUBCUTANEOUS
  Administered 2014-07-30: 15 [IU] via SUBCUTANEOUS
  Administered 2014-07-30: 7 [IU] via SUBCUTANEOUS
  Administered 2014-07-30 – 2014-07-31 (×2): 4 [IU] via SUBCUTANEOUS
  Administered 2014-07-31 (×2): 11 [IU] via SUBCUTANEOUS
  Administered 2014-08-01: 7 [IU] via SUBCUTANEOUS
  Administered 2014-08-01: 4 [IU] via SUBCUTANEOUS
  Administered 2014-08-01: 7 [IU] via SUBCUTANEOUS
  Administered 2014-08-02: 11 [IU] via SUBCUTANEOUS
  Administered 2014-08-02: 7 [IU] via SUBCUTANEOUS

## 2014-07-28 MED ORDER — QUETIAPINE FUMARATE ER 300 MG PO TB24
600.0000 mg | ORAL_TABLET | Freq: Every day | ORAL | Status: DC
  Administered 2014-07-28 – 2014-08-01 (×5): 600 mg via ORAL
  Filled 2014-07-28 (×6): qty 2
  Filled 2014-07-28: qty 28
  Filled 2014-07-28: qty 2

## 2014-07-28 MED ORDER — SALINE SPRAY 0.65 % NA SOLN
1.0000 | NASAL | Status: DC | PRN
  Administered 2014-07-28 – 2014-08-02 (×8): 1 via NASAL
  Filled 2014-07-28: qty 44

## 2014-07-28 MED ORDER — METFORMIN HCL 500 MG PO TABS
500.0000 mg | ORAL_TABLET | Freq: Two times a day (BID) | ORAL | Status: DC
  Administered 2014-07-28 – 2014-08-02 (×10): 500 mg via ORAL
  Filled 2014-07-28 (×4): qty 1
  Filled 2014-07-28: qty 28
  Filled 2014-07-28 (×5): qty 1
  Filled 2014-07-28: qty 28
  Filled 2014-07-28 (×2): qty 1

## 2014-07-28 MED ORDER — INSULIN ASPART 100 UNIT/ML ~~LOC~~ SOLN
15.0000 [IU] | Freq: Once | SUBCUTANEOUS | Status: AC
  Administered 2014-07-28: 15 [IU] via SUBCUTANEOUS

## 2014-07-28 MED ORDER — OXYMETAZOLINE HCL 0.05 % NA SOLN
1.0000 | Freq: Two times a day (BID) | NASAL | Status: DC
  Filled 2014-07-28: qty 15

## 2014-07-28 MED ORDER — CITALOPRAM HYDROBROMIDE 10 MG PO TABS
10.0000 mg | ORAL_TABLET | Freq: Every day | ORAL | Status: DC
  Administered 2014-07-28 – 2014-07-31 (×4): 10 mg via ORAL
  Filled 2014-07-28 (×6): qty 1

## 2014-07-28 MED ORDER — MENTHOL 3 MG MT LOZG: 1.0000 | LOZENGE | OROMUCOSAL | Status: DC | PRN

## 2014-07-28 MED ORDER — INSULIN ASPART 100 UNIT/ML ~~LOC~~ SOLN
4.0000 [IU] | Freq: Three times a day (TID) | SUBCUTANEOUS | Status: DC
  Administered 2014-07-29 – 2014-08-02 (×14): 4 [IU] via SUBCUTANEOUS

## 2014-07-28 NOTE — Progress Notes (Signed)
Recreation Therapy Notes  02.12.2016 @ approximately 9:55am. Duration 10 minutes   LRT met with patient 1:1 to help patient identify daily list of ADL's she can complete post d/c. LRT asked for patient assistance identifying ADL's however patient stated "I don't know, I really don't." When asked to identify the best way to start her day patient again identified drink a beer. When LRT asked if this would be beneficial to patient health she initially stated yes, when asked to identify how this is beneficial patient stated she would like to stop drinking and realizes starting her day with a beer is not in her best interest. Patient was able to identify a list of approximately 5 things she can do each day to invest in her ADL's, some of which she previously identified with LRT. These include, wash face, brush teeth, eat, take medicine, shower and dress. LRT then had patient identify at least 1 activity she can do each day to invest in her health. Patient identified quality activity, such as go for a walk, go to the park, go to [employer], spend time with family and listen to music. LRT encouraged patient to use these activities as coping skills, patient nodded in agreement, however LRT questions if patient will put to action the things she has identified in tx post d/c, given that patient is focused on ETOH use.   Patient did mention she does not prefer her roommate, as she had a hard time sleeping because her roommate sits on her bed and watches the patient sleep.   Laureen Ochs Fran Neiswonger, LRT/CTRS  Becca Bayne L 07/28/2014 10:15 AM

## 2014-07-28 NOTE — Progress Notes (Addendum)
[facility] MD Progress Note  07/28/2014 11:04 AM Maureen Swanson  MRN:  [id number] Subjective:  Patient states "I think you are standing on bugs , they are all over the floor ."  Objective:  Patient is an [place] female with past hx of schizoaffective disorder as well as substance use do (multiple) presented feeling suicidal and having homicidal ideation towards her ex boyfriend. Patient reported that she took over dose of her Seroquel and Trileptal yesterday to kill herself. She reported previously cutting her wrist in the past.  Patient seen this AM. Patient discussed with treatment team. Patient is an elderly obese woman ,walks with the assistance of a walker .  Patient with continued sx of depression. Patient also reports VH of seeing bugs , states that "Bugs are all over the wall and you are standing on them."   Patient's sleep issues has improved on the Lun[place] . Her seroquel to be titrated up to target her VH.  Patient with hx of cocaine , etoh as well as cannabis abuse. Her UDS was positive for cocaine on admission, BAL -128. CIWA reviewed . VS reviewed.  Patient to be interviewed by [facility] as well as [facility] - CSW working on referral process for substance abuse issues. Pt is motivated to get help.       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 Schizoaffective disorder, depressive type [F25.1] 07/24/2014  .  Cocaine use disorder, moderate, dependence [F14.20]   . Depression [F32.9] 02/17/2014  . 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 Knee pain [M25.569] 10/17/2010  . HYPERGLYCEMIA [R73.09] 08/16/2010  . MAMMOGRAM, ABNORMAL, RIGHT [R92.8] 08/05/2010  . HYPERLIPIDEMIA [E78.5] 06/21/2010  . AMENORRHEA, SECONDARY [N91.2] 10/29/2009  . 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 HELICOBACTER PYLORI INFECTION [B96.81] 04/02/2009  . GERD [K21.9] 10/11/2007  . HYPERTENSION [I10] 02/26/2007   Total Time spent with patient: 30 minutes   Past Medical History:  Past Medical History  Diagnosis Date  . High cholesterol   . Depression   . Gout   . Anxiety   . Bipolar 1 disorder   . GERD (gastroesophageal reflux disease)   . Substance abuse     crack cocaine  . Headache(784.0)     migraines  . TMJ (temporomandibular joint disorder)   . Asthma     daily and prn inhalers  . Arthritis     back and knees  . Gastric ulcer   . 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 Hypertension     under control, has been on med. x 12 yrs.  . Diabetes mellitus     diet-controlled    Past Surgical History  Procedure Laterality Date  . Knee arthroscopy w/ partial medial meniscectomy  05/01/2010    right  . Breast lumpectomy with needle localization  04/19/2012    Procedure: BREAST LUMPECTOMY WITH NEEDLE LOCALIZATION;  Surgeon: [name], MD;  Location: [facility];  Service: General;  Laterality: Right;   Family History:  Family History  Problem Relation Age of Onset  . Diabetes Mother   . Cancer Mother     breast  . Cancer Father    Social History:  History  Alcohol Use  . 0.0 oz/week    Comment: "4-40 ounces/day"     History  Drug Use  . 1.00 per week  . Special: Cocaine    Comment: last use 08/02/2012    History   Social History  . Marital Status: Single    Spouse Name: N/A  . Number of Children: N/A  . Years of Education: N/A    Social History Main Topics  . Smoking status: Former Smoker -- 15 years    Quit date: 08/02/2012  . Smokeless tobacco: Never Used     Comment: quit smoking 02/2012  . Alcohol Use: 0.0 oz/week     Comment: "4-40 ounces/day"  . Drug Use: 1.00 per week    Special: Cocaine     Comment: last use 08/02/2012  . Sexual Activity: No   Other Topics Concern  . None   Social History Narrative   Additional History:    Sleep: Fair  Appetite:  Fair   Musculoskeletal: Strength & Muscle Tone: within normal limits Gait & Station: with walker Patient leans: ambulates slow   Psychiatric Specialty Exam: Physical Exam  Constitutional: She appears well-developed and well-nourished.  HENT:  Head: Normocephalic.    Review of Systems  Constitutional: Negative.   HENT: Negative.   Eyes: Negative.   Respiratory: Negative.   Cardiovascular: Negative.   Gastrointestinal: Negative.   Genitourinary: Negative.   Musculoskeletal: Negative.   Skin: Negative.   Neurological: Negative.   Psychiatric/Behavioral: Positive for depression, hallucinations and substance abuse. The patient is nervous/anxious.     Blood pressure 124/83, pulse 97, temperature 98.7 F (37.1 C), temperature source Oral, resp. rate 16, height 5' 3.5" (1.613 m), weight 98.884 kg (218 lb), SpO2 100 %.Body mass index is 38.01 kg/(m^2).  General Appearance: Casual  Eye Contact::  Minimal  Speech:  Slow  Volume:  Decreased  Mood:  Depressed and Dysphoric  Affect:  Depressed  Thought Process:  Coherent  Orientation:  Full (Time, Place, and Person)  Thought Content:  Hallucinations: Auditory Visual AH is reduced , VH of seeing bugs is worsening  Suicidal Thoughts:  No  Homicidal Thoughts:  No  Memory:  Immediate;   Fair Recent;   Fair  Judgement:  Poor  Insight:  Shallow  Psychomotor Activity:  Decreased  Concentration:  Fair  Recall:  [employer] of Knowledge:Fair  Language: Fair  Akathisia:  Negative  Handed:   Right  AIMS (if indicated):     Assets:  Others:  access to health care   ADL's:  Intact  Cognition: WNL  Sleep:  Number of Hours: 6.75     Current Medications: Current Facility-Administered Medications  Medication Dose Route Frequency Provider Last Rate Last Dose  . albuterol (PROVENTIL HFA;VENTOLIN HFA) 108 (90 BASE) MCG/ACT inhaler 2 puff  2 puff Inhalation Q6H PRN Delfin Gant, NP   2 puff at 07/24/14 2107  . atenolol (TENORMIN) tablet 100 mg  100 mg Oral Daily Delfin Gant, NP   100 mg at 07/28/14 0831  . celecoxib (CELEBREX) capsule 100 mg  100 mg Oral BID Merian Capron, MD   100 mg at 07/28/14 0831  . eszopiclone (LUNESTA) tablet 1 mg  1 mg Oral QHS [name], MD  1 mg at 07/27/14 2112  . hydrochlorothiazide (HYDRODIURIL) tablet 25 mg  25 mg Oral Daily Delfin Gant, NP   25 mg at 07/28/14 0831  . hydrOXYzine (ATARAX/VISTARIL) tablet 25 mg  25 mg Oral Q6H PRN Delfin Gant, NP   25 mg at 07/28/14 0835  . ibuprofen (ADVIL,MOTRIN) tablet 600 mg  600 mg Oral Q6H PRN Janett Labella, NP   600 mg at 07/28/14 8119  . loratadine (CLARITIN) tablet 10 mg  10 mg Oral Daily Delfin Gant, NP   10 mg at 07/28/14 0831  . LORazepam (ATIVAN) tablet 1 mg  1 mg Oral Q4H PRN [name], MD      . metFORMIN (GLUCOPHAGE) tablet 500 mg  500 mg Oral QPM Delfin Gant, NP   500 mg at 07/27/14 1716  . Oxcarbazepine (TRILEPTAL) tablet 600 mg  600 mg Oral BID [name], MD   600 mg at 07/28/14 0831  . pantoprazole (PROTONIX) EC tablet 40 mg  40 mg Oral QHS Delfin Gant, NP   40 mg at 07/27/14 2112  . QUEtiapine (SEROQUEL XR) 24 hr tablet 500 mg  500 mg Oral Q2000 [name], MD   500 mg at 07/27/14 2041    Lab Results:  Results for orders placed or performed during the hospital encounter of 07/21/14 (from the past 48 hour(s))  Glucose, capillary     Status: Abnormal   Collection Time: 07/26/14  4:39 PM  Result Value Ref Range    Glucose-Capillary 306 (H) 70 - 99 mg/dL  Glucose, capillary     Status: Abnormal   Collection Time: 07/27/14  6:08 AM  Result Value Ref Range   Glucose-Capillary 127 (H) 70 - 99 mg/dL  Glucose, capillary     Status: Abnormal   Collection Time: 07/27/14  5:07 PM  Result Value Ref Range   Glucose-Capillary 288 (H) 70 - 99 mg/dL  Glucose, capillary     Status: Abnormal   Collection Time: 07/28/14  6:00 AM  Result Value Ref Range   Glucose-Capillary 185 (H) 70 - 99 mg/dL   Comment 1 Notify RN     Physical Findings: AIMS: Facial and Oral Movements Muscles of Facial Expression: None, normal Lips and Perioral Area: None, normal Jaw: None, normal Tongue: None, normal,Extremity Movements Upper (arms, wrists, hands, fingers): None, normal Lower (legs, knees, ankles, toes): None, normal, Trunk Movements Neck, shoulders, hips: None, normal, Overall Severity Severity of abnormal movements (highest score from questions above): None, normal Incapacitation due to abnormal movements: None, normal Patient's awareness of abnormal movements (rate only patient's report): No Awareness, Dental Status Current problems with teeth and/or dentures?: No Does patient usually wear dentures?: No  CIWA:  CIWA-Ar Total: 6 COWS:  COWS Total Score: 2   Assessment: Patient is an elderly AAF with hx of schizoaffective disorder, polysubstance abuse, presented with SI as well as command AH . Pt with continued sx of depression as well as VH.    Treatment Plan Summary: Daily contact with patient to assess and evaluate symptoms and progress in treatment and Medication management  Will continue CIWA TID as well as Ativan prn for CIWA>/= 15.Pt with some perceptual disturbances likely secondary to substance abuse -seeing spiders and bugs. Will continue Trileptal  1200 mg po daily. Will add a small dose of Celexa 10 mg po daily for affective sx. Will increase Seroquel to  600 mg po at 8.00 PM - pt requests it to be  given at  8 pm . Will continue Luensta 1 mg po qhs for sleep. CSW will work on disposition.Patient with upcoming interviews for [facility]. Patient with multiple medical problems , will continue to monitor.   Medical Decision Making:  Established Problem, Stable/Improving (1), Review of Psycho-Social Stressors (1), Review of Last Therapy Session (1) and Review of Medication Regimen & Side Effects (2)     Naphtali Zywicki,SarammaMD 07/28/2014, 11:04 AM

## 2014-07-28 NOTE — [facility] Group Notes (Signed)
Adult Psychoeducational Group Note  Date:  07/28/2014 Time:  9:52 PM  Group Topic/Focus:  Wrap-Up Group:   The focus of this group is to help patients review their daily goal of treatment and discuss progress on daily workbooks.  Participation Level:  Active  Participation Quality:  Appropriate  Affect:  Appropriate  Cognitive:  Appropriate  Insight: Appropriate  Engagement in Group:  Engaged  Modes of Intervention:  Discussion  Additional Comments:  Jani said her day was fair.  She saw the doctor and one of her medications was adjusted and she started a new medication.  She has no discharge plans yet.  She likes to listen to the radio and play with grand kids.  Victorino Sparrow A 07/28/2014, 9:52 PM

## 2014-07-28 NOTE — Progress Notes (Signed)
Recreation Therapy Notes  02.11.2016 at approximately 3:10pm Duration: 20 minutes  LRT met with patient 1:1 to assist patient with making plan for ADL completion, as she expressed during her assessment interview this is most important to her and to review stress management technique presented during assessment interview. Patient practiced deep breathing with LRT and was successful at technique. LRT introduced progressive muscle relaxation to patient, patient receptive, but stated she did not prefer technique. LRT encouraged patient to use deep breathing on a consistent basis to consistently decrease stress, patient receptive and identified if she did not feel significantly stressed out she would be less likely to smoke crack on occasion.   LRT then provided patient with worksheet with the outline of a body, using the worksheet patient was asked to identify all the positive things that result when patient is able to complete her ADL's independently and manage her medications. Patient identified she feels motivated, happy and less stress (no smoking crack) in her mind when she manages her ADL's and medications. Patient listed glad in her heart and relaxed and energetic in her body. Patient was then asked to identify what ADL's she needs to engage in 1st thing in the morning to make sure she experiences the positive things she identified. Patient initially stated she could "drink a beer" first thing in the morning to "get my day going right" LRT encouraged patient to identify healthy activities she can participate in. Patient ultimately able to identify "wash my face, brush my teeth, eat breakfast and take medications."   Patient was asked to identify what she needs to help her remember to stay on track with her ADL's and medications. Patient flatly stated she did not know, LRT provided encouragement for patient, but patient was still not able to identify what she thinks will help her. LRT instructed patient to  think about what she needs and LRT would follow up following day (02.12.2016) to help patient identify what will help her manage her ADL's and medications post d/c.   Patient stated she believes she is going to a 60 day treatment facility post d/c. LRT to follow up 02.12.2016.  Laureen Ochs Anyi Fels, LRT/CTRS  Helton Oleson L 07/28/2014 8:25 AM

## 2014-07-28 NOTE — Progress Notes (Signed)
D: Pt says she's same SI-contracts, AVH-same.. Pt is pleasant and cooperative. Pt stayed in her room "I was afraid to come out cause that girl was making all that noise"  A: Pt was offered support and encouragement. Pt was given scheduled medications. Pt was encourage to attend groups. Q 15 minute checks were done for safety.   R:Pt attends groups and interacts well with peers and staff. Pt is taking medication. Pt has no complaints at this time .Pt receptive to treatment and safety maintained on unit.

## 2014-07-28 NOTE — Progress Notes (Signed)
Pt's CBG-447, pt feeling "dizzy", NP notified, new orders placed for insulin, per NP order for one time dose of 15 units to be given before dinner, see orders and MAR.

## 2014-07-28 NOTE — Progress Notes (Signed)
D:  Per pt self inventory pt reports sleeping fair last night--is improving per pt, appetite fair in the last 24 hours, energy level low, ability to pay attention poor, rates depression at a 9 out of 10 and hopelessness at a 9 out of 10, anxiety at an 8 out of 10, endorses SI/VH--sees bugs, denies AH/HI, Goal for today:  "Get my mind to stop racing on bad thoughts."     A:  Emotional support provided, Encouraged pt to continue with treatment plan and attend all group activities, q15 min checks maintained for safety.  R:  Pt is cooperative and pleasant to staff, ambulates with a walker, pt has phone interview with Docia Chuck today at 1500 pm, pt has been going to groups and is appropriate on the unit.

## 2014-07-28 NOTE — [facility] Group Notes (Signed)
[facility] LCSW Group Therapy  07/28/2014 1:33 PM  Type of Therapy:  Group Therapy  Participation Level:  Active  Participation Quality:  Appropriate  Affect:  Flat  Cognitive:  Alert  Insight:  Engaged   Engagement in Therapy:  Engaged  Modes of Intervention:  Discussion, Socialization and Support  Summary of Progress/Problems:Today's group focused on topics such as hope and care. Pt stated she felt hopeful about 2 months ago when she was spending time with her son and grandchildren. She hopes to reconnect with them. Maureen Swanson chose a picture of monks walking over a waterfall. She chose this picture because they looked focused in the moment. She hopes she can find peace of mind too. "I need stability in my life."   Swanson,Maureen 07/28/2014, 1:33 PM

## 2014-07-28 NOTE — Progress Notes (Signed)
Pt has a hard time processing information. Pt can not accept change or adapt to a new situation without acting out. Pt can not understand even when the information is explained to the patient on a very elementary level. Pt gets very upset, angry, acts out, spits on people, hits people. Patient is quick to apologize, but appears that she does not even understand the meaning of the word. Pt stated the previous night she was going to spit  In this writers face, pt tried to spit in this writers face and various other staff on the unit. Pt mental capacity does not appear able to process on the level needed to improve behavior to an acceptable level.

## 2014-07-28 NOTE — Progress Notes (Signed)
D: Pt passive SI-contracts, +ve AVH- same as before.. Pt is pleasant and cooperative. Pt goal for today is to work on her communication with her mother. Pt CBG was over 400 earlier in the day. Pt stated she has appt with Patty Sermons, may try [facility], or [facility].   A: Pt was offered support and encouragement. Pt was given scheduled medications. Pt was encourage to attend groups. Q 15 minute checks were done for safety. Pt educated on making good choices to keep CBG down.   R:Pt attends groups and interacts well with peers and staff. Pt is taking medication. Pt has no complaints at this time.Pt receptive to treatment and safety maintained on unit. PT CBG this evening 160.

## 2014-07-29 LAB — GLUCOSE, CAPILLARY
Glucose-Capillary: 262 mg/dL — ABNORMAL HIGH (ref 70–99)
Glucose-Capillary: 218 mg/dL — ABNORMAL HIGH (ref 70–99)
Glucose-Capillary: 250 mg/dL — ABNORMAL HIGH (ref 70–99)
Glucose-Capillary: 177 mg/dL — ABNORMAL HIGH (ref 70–99)

## 2014-07-29 NOTE — Progress Notes (Signed)
Patient ID: Maureen Swanson, female   DOB: 15-Aug-1965, 49 y.o.   MRN: [id number]  D: Patient pleasant and cooperative on assessment. Pt states she had a good visit with her son this evening; staff witnessed good interaction during visit. Pt attended group session and was compliant with medications.  A: Q 15 minute safety checks, encourage group participation and peer interaction, administer medications as ordered by MD. R: Pt denies SI/HI or hallucinations at this time. No inappropriate behaviors noted. Pt was relocated to a new room due to her roommate keeping her awake the previous night. Mr. Baby happy about moving.

## 2014-07-29 NOTE — [facility] Group Notes (Signed)
[place] Group Notes:  Coping skills and goals  Date:  07/29/2014  Time: 10:00am  Type of Therapy:  Nurse Education  Participation Level:  Active  Participation Quality:  Appropriate  Affect:  Appropriate  Cognitive:  Alert  Insight:  Appropriate  Engagement in Group:  Engaged  Modes of Intervention:  Discussion  Summary of Progress/Problems:  Delman Kitten 07/29/2014, 10:34 AM

## 2014-07-29 NOTE — Progress Notes (Signed)
Patient ID: Maureen Swanson, female   DOB: Jan 11, 1966, 49 y.o.   MRN: [id number] [facility] MD Progress Note  07/29/2014 4:02 PM Maureen Swanson  MRN:  [id number] Subjective:  Patient states that she is seeing spiders and hearing noises. Objective:  Patient is an [place] female with past hx of schizoaffective disorder as well as substance use do (multiple) presented feeling suicidal and having homicidal ideation towards her ex boyfriend. Patient reported that she took over dose of her Seroquel and Trileptal yesterday to kill herself. She reported previously cutting her wrist in the past.  Patient seen this AM.  She appears and states that she is depressed.  Patient discussed with treatment team. Patient is an elderly obese woman ,walks with the assistance of a walker .  Patient with continued sx of depression. Patient also reports VH of seeing spiders She is tolerating her seroquel to be titrated up to target her VH.  Her UDS was positive for cocaine on admission, BAL -128. CIWA reviewed . VS reviewed.  Patient to be interviewed by [facility] as well as [facility] - CSW working on referral process for substance abuse issues. Pt is motivated to get help.  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 Schizoaffective disorder, depressive type [F25.1] 07/24/2014  . Cocaine use disorder, moderate, dependence [F14.20]   . Depression [F32.9]  02/17/2014  . 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 Knee pain [M25.569] 10/17/2010  . HYPERGLYCEMIA [R73.09] 08/16/2010  . MAMMOGRAM, ABNORMAL, RIGHT [R92.8] 08/05/2010  . HYPERLIPIDEMIA [E78.5] 06/21/2010  . AMENORRHEA, SECONDARY [N91.2] 10/29/2009  . 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 HELICOBACTER PYLORI INFECTION [B96.81] 04/02/2009  . GERD [K21.9] 10/11/2007  . HYPERTENSION [I10] 02/26/2007   Total Time spent with patient: 30 minutes   Past Medical History:  Past Medical History  Diagnosis Date  . High cholesterol   . Depression   . Gout   . Anxiety   . Bipolar 1 disorder   . GERD (gastroesophageal reflux disease)   . Substance abuse     crack cocaine  . Headache(784.0)     migraines  . TMJ (temporomandibular joint disorder)   . Asthma     daily and prn inhalers  . Arthritis     back and knees  . Gastric ulcer   . 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 Hypertension     under control, has been on med. x 12 yrs.  . Diabetes mellitus     diet-controlled    Past Surgical History  Procedure Laterality Date  . Knee arthroscopy w/ partial medial meniscectomy  05/01/2010    right  . Breast lumpectomy with needle localization  04/19/2012    Procedure: BREAST LUMPECTOMY WITH NEEDLE LOCALIZATION;  Surgeon: [name], MD;  Location: [place];  Service: General;  Laterality: Right;   Family History:  Family History  Problem Relation Age of Onset  . Diabetes Mother   .  Cancer Mother     breast  . Cancer Father    Social History:  History  Alcohol Use  . 0.0 oz/week    Comment: "4-40 ounces/day"     History  Drug Use  . 1.00 per week  . Special: Cocaine    Comment: last use 08/02/2012    History   Social History  . Marital Status: Single    Spouse Name: N/A  . Number of Children: N/A  . Years of Education: N/A   Social History Main Topics  . Smoking status: Former Smoker -- 15 years    Quit  date: 08/02/2012  . Smokeless tobacco: Never Used     Comment: quit smoking 02/2012  . Alcohol Use: 0.0 oz/week     Comment: "4-40 ounces/day"  . Drug Use: 1.00 per week    Special: Cocaine     Comment: last use 08/02/2012  . Sexual Activity: No   Other Topics Concern  . None   Social History Narrative   Additional History:    Sleep: Fair  Appetite:  Fair   Musculoskeletal: Strength & Muscle Tone: within normal limits Gait & Station: with walker Patient leans: ambulates slow   Psychiatric Specialty Exam: Physical Exam  Vitals reviewed. Constitutional: She appears well-developed and well-nourished.  HENT:  Head: Normocephalic.    Review of Systems  Constitutional: Negative.   HENT: Negative.   Eyes: Negative.   Respiratory: Negative.   Cardiovascular: Negative.   Gastrointestinal: Negative.   Genitourinary: Negative.   Musculoskeletal: Negative.   Skin: Negative.   Neurological: Negative.   Endo/Heme/Allergies: Negative.   Psychiatric/Behavioral: Positive for depression. Negative for hallucinations and substance abuse. The patient is nervous/anxious.     Blood pressure 154/101, pulse 81, temperature 99.1 F (37.3 C), temperature source Oral, resp. rate 20, height 5' 3.5" (1.613 m), weight 98.884 kg (218 lb), SpO2 100 %.Body mass index is 38.01 kg/(m^2).  General Appearance: Casual  Eye Contact::  Minimal  Speech:  Slow  Volume:  Decreased  Mood:  Depressed and Dysphoric  Affect:  Depressed  Thought Process:  Coherent  Orientation:  Full (Time, Place, and Person)  Thought Content:  Hallucinations: Auditory Visual AH is reduced , VH of seeing bugs is worsening  Suicidal Thoughts:  No  Homicidal Thoughts:  No  Memory:  Immediate;   Fair Recent;   Fair  Judgement:  Poor  Insight:  Shallow  Psychomotor Activity:  Decreased  Concentration:  Fair  Recall:  [employer] of Knowledge:Fair  Language: Fair  Akathisia:  Negative  Handed:  Right  AIMS (if  indicated):     Assets:  Others:  access to health care   ADL's:  Intact  Cognition: WNL  Sleep:  Number of Hours: 5     Current Medications: Current Facility-Administered Medications  Medication Dose Route Frequency Provider Last Rate Last Dose  . albuterol (PROVENTIL HFA;VENTOLIN HFA) 108 (90 BASE) MCG/ACT inhaler 2 puff  2 puff Inhalation Q6H PRN Delfin Gant, NP   2 puff at 07/29/14 0800  . atenolol (TENORMIN) tablet 100 mg  100 mg Oral Daily Delfin Gant, NP   100 mg at 07/29/14 2229  . celecoxib (CELEBREX) capsule 100 mg  100 mg Oral BID Merian Capron, MD   100 mg at 07/29/14 0823  . citalopram (CELEXA) tablet 10 mg  10 mg Oral Daily [name], MD   10 mg at 07/29/14 0823  . eszopiclone (LUNESTA) tablet 1 mg  1  mg Oral QHS [name], MD   1 mg at 07/28/14 2131  . hydrochlorothiazide (HYDRODIURIL) tablet 25 mg  25 mg Oral Daily Delfin Gant, NP   25 mg at 07/29/14 0824  . hydrOXYzine (ATARAX/VISTARIL) tablet 25 mg  25 mg Oral Q6H PRN Delfin Gant, NP   25 mg at 07/28/14 2131  . ibuprofen (ADVIL,MOTRIN) tablet 600 mg  600 mg Oral Q6H PRN Janett Labella, NP   600 mg at 07/28/14 2131  . insulin aspart (novoLOG) injection 0-20 Units  0-20 Units Subcutaneous TID WC Encarnacion Slates, NP   7 Units at 07/29/14 1200  . insulin aspart (novoLOG) injection 4 Units  4 Units Subcutaneous TID WC Encarnacion Slates, NP   4 Units at 07/29/14 1200  . loratadine (CLARITIN) tablet 10 mg  10 mg Oral Daily Delfin Gant, NP   10 mg at 07/29/14 9233  . LORazepam (ATIVAN) tablet 1 mg  1 mg Oral Q4H PRN [name], MD      . menthol-cetylpyridinium (CEPACOL) lozenge 3 mg  1 lozenge Oral PRN [name], MD      . metFORMIN (GLUCOPHAGE) tablet 500 mg  500 mg Oral BID WC Encarnacion Slates, NP   500 mg at 07/29/14 0823  . Oxcarbazepine (TRILEPTAL) tablet 600 mg  600 mg Oral BID [name], MD   600 mg at 07/29/14 0823  . pantoprazole (PROTONIX) EC tablet 40 mg  40 mg Oral  QHS Delfin Gant, NP   40 mg at 07/28/14 2130  . QUEtiapine (SEROQUEL XR) 24 hr tablet 600 mg  600 mg Oral Q2000 Saramma Eappen, MD   600 mg at 07/28/14 2015  . sodium chloride (OCEAN) 0.65 % nasal spray 1 spray  1 spray Each Nare PRN [name], MD   1 spray at 07/29/14 0800    Lab Results:  Results for orders placed or performed during the hospital encounter of 07/21/14 (from the past 48 hour(s))  Glucose, capillary     Status: Abnormal   Collection Time: 07/27/14  5:07 PM  Result Value Ref Range   Glucose-Capillary 288 (H) 70 - 99 mg/dL  Glucose, capillary     Status: Abnormal   Collection Time: 07/28/14  6:00 AM  Result Value Ref Range   Glucose-Capillary 185 (H) 70 - 99 mg/dL   Comment 1 Notify RN   Glucose, capillary     Status: Abnormal   Collection Time: 07/28/14  5:02 PM  Result Value Ref Range   Glucose-Capillary 447 (H) 70 - 99 mg/dL   Comment 1 Document in Chart    Comment 2 Repeat Test   Glucose, capillary     Status: Abnormal   Collection Time: 07/28/14  8:22 PM  Result Value Ref Range   Glucose-Capillary 160 (H) 70 - 99 mg/dL   Comment 1 Notify RN   Glucose, capillary     Status: Abnormal   Collection Time: 07/29/14  6:13 AM  Result Value Ref Range   Glucose-Capillary 262 (H) 70 - 99 mg/dL   Comment 1 Notify RN   Glucose, capillary     Status: Abnormal   Collection Time: 07/29/14 12:00 PM  Result Value Ref Range   Glucose-Capillary 218 (H) 70 - 99 mg/dL    Physical Findings: AIMS: Facial and Oral Movements Muscles of Facial Expression: None, normal Lips and Perioral Area: None, normal Jaw: None, normal Tongue: None, normal,Extremity Movements Upper (arms, wrists, hands, fingers): None, normal Lower (  legs, knees, ankles, toes): None, normal, Trunk Movements Neck, shoulders, hips: None, normal, Overall Severity Severity of abnormal movements (highest score from questions above): None, normal Incapacitation due to abnormal movements: None,  normal Patient's awareness of abnormal movements (rate only patient's report): No Awareness, Dental Status Current problems with teeth and/or dentures?: No Does patient usually wear dentures?: No  CIWA:  CIWA-Ar Total: 7 COWS:  COWS Total Score: 2  Assessment: Patient is an elderly AAF with hx of schizoaffective disorder, polysubstance abuse, presented with SI as well as command AH . Pt with continued sx of depression as well as VH.  Treatment Plan Summary: Daily contact with patient to assess and evaluate symptoms and progress in treatment and Medication management  Will continue CIWA TID as well as Ativan prn for CIWA>/= 15.Pt with some perceptual disturbances likely secondary to substance abuse -seeing spiders and bugs. Will continue Trileptal  1200 mg po daily. Will add a small dose of Celexa 10 mg po daily for affective sx. Will increase Seroquel to  600 mg po at 8.00 PM - pt requests it to be given at 8 pm . Will continue Luensta 1 mg po qhs for sleep. CSW will work on disposition.Patient with upcoming interviews for [facility]. Patient with multiple medical problems , will continue to monitor.  Medical Decision Making:  Established Problem, Stable/Improving (1), Review of Psycho-Social Stressors (1), Review of Last Therapy Session (1) and Review of Medication Regimen & Side Effects (2)  AGUSTIN, SHEILA MAY AGNP-BC 07/29/2014, 4:02 PM  Agree with Assessment and Plan

## 2014-07-29 NOTE — Progress Notes (Addendum)
Pt is very quiet this am.She stated she keeps seeing black spiders and hearing a whistling noise. Pt does contract for safety and denies SI and HI. Pt has been attendiing groups. Pts fsbs was 219. Pt was given a total of 11 units. She appears very withdrawn and depressed to day. Pt rates her depression and anxiety a 9/10 today. Her goal is to see her sons family. Her energy level is very low.

## 2014-07-29 NOTE — [facility] Group Notes (Signed)
[facility] [facility] Group Notes:  (Clinical Social Work)  07/29/2014  11:15-12:00PM  Summary of Progress/Problems:   The main focus of today's process group was to discuss patients' feelings about hospitalization, the stigma attached to mental health, and sources of motivation to stay well.  We then worked to identify a specific plan to avoid future hospitalizations when discharged from the hospital for this admission.  The patient expressed that she feels confused today, but she feels the hospital gives her structure that is helpful to her.  She wants to go to long-term treatment, 2 years not just 30 days.  Type of Therapy:  Group Therapy - Process  Participation Level:  Active  Participation Quality:  Attentive  Affect:  Blunted and Depressed  Cognitive:  Appropriate  Insight:  Engaged  Engagement in Therapy:  Engaged  Modes of Intervention:  Exploration, Discussion  Selmer Dominion, LCSW 07/29/2014, 5:04 PM

## 2014-07-29 NOTE — Progress Notes (Signed)
[facility] Group Notes:  (Nursing/MHT/Case Management/Adjunct)  Date:  07/29/2014  Time:  11:22 PM  Type of Therapy:  Psychoeducational Skills  Participation Level:  Active  Participation Quality:  Appropriate  Affect:  Flat  Cognitive:  Appropriate  Insight:  Good  Engagement in Group:  Engaged  Modes of Intervention:  Education  Summary of Progress/Problems: Patient stated in group that she had a better day due in part to the fact that her son came in to visit her. She states that she continues to experience visual hallucinations as well as auditory hallucinations (bells). In terms of the theme for the day, her coping skill is to attend follow up appointments with [facility] and to transfer to [facility] once she is discharged from this hospital.   Archie Balboa S 07/29/2014, 11:22 PM

## 2014-07-30 DIAGNOSIS — F1590 Other stimulant use, unspecified, uncomplicated: Secondary | ICD-10-CM

## 2014-07-30 LAB — GLUCOSE, CAPILLARY
GLUCOSE-CAPILLARY: 207 mg/dL — AB (ref 70–99)
GLUCOSE-CAPILLARY: 182 mg/dL — AB (ref 70–99)
Glucose-Capillary: 312 mg/dL — ABNORMAL HIGH (ref 70–99)
Glucose-Capillary: 162 mg/dL — ABNORMAL HIGH (ref 70–99)

## 2014-07-30 NOTE — [facility] Group Notes (Signed)
[facility] [place] Group Notes:  (Nursing/MHT/Case Management/Adjunct)  Date:  07/30/2014  Time:  0930 am  Type of Therapy:  Psychoeducational Skills  Participation Level:  Minimal  Participation Quality:  Appropriate  Affect:  Flat  Cognitive:  Alert  Insight:  Lacking  Engagement in Group:  Limited  Modes of Intervention:  Support  Summary of Progress/Problems: Maureen Swanson seemed to open up more as group progressed.  Her goal is get a pill organizer when she is discharged so she can keep up with her medications better.  She also plans to talk with the doctor about her medications.  Maureen Swanson 07/30/2014, 10:27 AM

## 2014-07-30 NOTE — Progress Notes (Addendum)
Patient resting in bed after breakfast but did get up later for meds. States her chronic R knee pain is at 8/10 and the celebrex is somewhat effective. Endorsing AVH this morning - "spiders crawling and voices telling me to kill myself." "I need to talk to the doctor about it." She remains flat and depressed both in affect and mood though rates her depression at a 2/10. Hopelessness is a 0/10 and anxiety is an 8/10.  Denying other physical problems. Patient supported and reassured. Medicated per orders without difficulty. Celebrex given for pain which she indicates is somewhat helpful. Patient verbally contracts for safety and denies HI. Jamie Kato

## 2014-07-30 NOTE — Progress Notes (Signed)
Patient ID: Maureen Swanson, female   DOB: 04-16-1966, 49 y.o.   MRN: [id number] [facility] MD Progress Note  07/30/2014 3:04 PM Maureen Swanson  MRN:  [id number] Subjective:  "Everything is getting worse. I'm still seeing bugs and spiders all over the place and I'm still hearing voices".     Objective: Pt seen and chart reviewed. Pt reports that her auditory and visual hallucinations are worsening. However, at the beginning of the assessment, she reports that they are better overall. Pt is inconsistent with her statements. Pt does report that the voices are telling her she is "worthless, should die, kill yourself". She affirms SI, HI, and AVH and cannot contract for safety at this time.   Her UDS was positive for cocaine on admission, BAL -128. CIWA reviewed . VS reviewed.  Patient to be interviewed by [facility] as well as [facility] - CSW working on referral process for substance abuse issues. Pt is motivated to get help.  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 Schizoaffective disorder, depressive type [F25.1] 07/24/2014  . Cocaine use disorder, moderate, dependence [F14.20]   . Depression [F32.9] 02/17/2014  . 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 Knee pain [M25.569] 10/17/2010  . HYPERGLYCEMIA [R73.09] 08/16/2010  . MAMMOGRAM, ABNORMAL, RIGHT [R92.8] 08/05/2010  . HYPERLIPIDEMIA [E78.5] 06/21/2010  .  AMENORRHEA, SECONDARY [N91.2] 10/29/2009  . 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 HELICOBACTER PYLORI INFECTION [B96.81] 04/02/2009  . GERD [K21.9] 10/11/2007  . HYPERTENSION [I10] 02/26/2007   Total Time spent with patient: 30 minutes   Past Medical History:  Past Medical History  Diagnosis Date  . High cholesterol   . Depression   . Gout   . Anxiety   . Bipolar 1 disorder   . GERD (gastroesophageal reflux disease)   . Substance abuse     crack cocaine  . Headache(784.0)     migraines  . TMJ (temporomandibular joint disorder)   . Asthma     daily and prn inhalers  . Arthritis     back and knees  . Gastric ulcer   . 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 Hypertension     under control, has been on med. x 12 yrs.  . Diabetes mellitus     diet-controlled    Past Surgical History  Procedure Laterality Date  . Knee arthroscopy w/ partial medial meniscectomy  05/01/2010    right  . Breast lumpectomy with needle localization  04/19/2012    Procedure: BREAST LUMPECTOMY WITH NEEDLE LOCALIZATION;  Surgeon: [name], MD;  Location: [place];  Service: General;  Laterality: Right;   Family History:  Family History  Problem Relation Age of Onset  . Diabetes Mother   . Cancer Mother     breast  . Cancer Father    Social History:  History  Alcohol Use  . 0.0 oz/week    Comment: "4-40 ounces/day"     History  Drug Use  . 1.00 per week  . Special: Cocaine    Comment: last use 08/02/2012    History   Social History  . Marital Status: Single    Spouse Name: N/A  . Number of Children: N/A  . Years of Education: N/A   Social History Main Topics  . Smoking status: Former Smoker -- 15 years    Quit date: 08/02/2012  . Smokeless tobacco: Never Used     Comment: quit smoking 02/2012  . Alcohol Use: 0.0 oz/week     Comment: "4-40 ounces/day"  . Drug Use: 1.00 per week    Special: Cocaine     Comment: last use 08/02/2012  .  Sexual Activity: No   Other Topics Concern  . None   Social History Narrative   Additional History:    Sleep: Fair  Appetite:  Fair   Musculoskeletal: Strength & Muscle Tone: within normal limits Gait & Station: with walker Patient leans: ambulates slow   Psychiatric Specialty Exam: Physical Exam  Vitals reviewed. Constitutional: She appears well-developed and well-nourished.  HENT:  Head: Normocephalic.    Review of Systems  Constitutional: Negative.   HENT: Negative.   Eyes: Negative.   Respiratory: Negative.   Cardiovascular: Negative.   Gastrointestinal: Negative.   Genitourinary: Negative.   Musculoskeletal: Negative.   Skin: Negative.   Neurological: Negative.   Endo/Heme/Allergies: Negative.   Psychiatric/Behavioral: Positive for depression. The patient is nervous/anxious.     Blood pressure 142/95, pulse 77, temperature 99 F (37.2 C), temperature source Oral, resp. rate 16, height 5' 3.5" (1.613 m), weight 98.884 kg (218 lb), SpO2 100 %.Body mass index is 38.01 kg/(m^2).  General Appearance: Casual  Eye Contact::  Minimal  Speech:  Slow  Volume:  Decreased  Mood:  Depressed and Dysphoric  Affect:  Depressed  Thought Process:  Coherent  Orientation:  Full (Time, Place, and Person)  Thought Content:  Hallucinations: Auditory Visual AH is reduced , VH of seeing bugs is worsening  Suicidal Thoughts:  No  Homicidal Thoughts:  No  Memory:  Immediate;   Fair Recent;   Fair  Judgement:  Poor  Insight:  Shallow  Psychomotor Activity:  Decreased  Concentration:  Fair  Recall:  [employer] of Knowledge:Fair  Language: Fair  Akathisia:  Negative  Handed:  Right  AIMS (if indicated):     Assets:  Others:  access to health care   ADL's:  Intact  Cognition: WNL  Sleep:  Number of Hours: 6     Current Medications: Current Facility-Administered Medications  Medication Dose Route Frequency Provider Last Rate Last Dose  . albuterol (PROVENTIL  HFA;VENTOLIN HFA) 108 (90 BASE) MCG/ACT inhaler 2 puff  2 puff Inhalation Q6H PRN Delfin Gant, NP   2 puff at 07/29/14 0800  . atenolol (TENORMIN) tablet 100 mg  100 mg Oral Daily Delfin Gant, NP   100 mg at 07/30/14 0844  . celecoxib (CELEBREX) capsule 100 mg  100 mg Oral BID Merian Capron, MD   100 mg at 07/30/14 0844  . citalopram (CELEXA) tablet 10 mg  10 mg Oral Daily [name], MD   10 mg at 07/30/14 0844  . eszopiclone (LUNESTA) tablet 1 mg  1 mg Oral QHS Saramma Eappen, MD   1 mg at 07/29/14 2026  . hydrochlorothiazide (HYDRODIURIL) tablet 25 mg  25 mg Oral Daily Delfin Gant, NP   25 mg at 07/30/14 0844  . hydrOXYzine (ATARAX/VISTARIL) tablet 25 mg  25 mg Oral Q6H PRN Delfin Gant, NP   25 mg at 07/29/14 2026  . ibuprofen (ADVIL,MOTRIN) tablet 600 mg  600 mg Oral Q6H PRN Janett Labella, NP   600 mg at 07/28/14 2131  . insulin aspart (novoLOG) injection 0-20 Units  0-20 Units Subcutaneous TID WC Encarnacion Slates, NP   15 Units at 07/30/14 1205  . insulin aspart (novoLOG) injection 4 Units  4 Units Subcutaneous TID WC Encarnacion Slates, NP   4 Units at 07/30/14 1204  . loratadine (CLARITIN) tablet 10 mg  10 mg Oral Daily Delfin Gant, NP   10 mg at 07/30/14 0844  . LORazepam (ATIVAN) tablet 1 mg  1 mg Oral Q4H PRN [name], MD      . menthol-cetylpyridinium (CEPACOL) lozenge 3 mg  1 lozenge Oral PRN [name], MD      . metFORMIN (GLUCOPHAGE) tablet 500 mg  500 mg Oral BID WC Encarnacion Slates, NP   500 mg at 07/30/14 0844  . Oxcarbazepine (TRILEPTAL) tablet 600 mg  600 mg Oral BID [name], MD   600 mg at 07/30/14 0844  . pantoprazole (PROTONIX) EC tablet 40 mg  40 mg Oral QHS Delfin Gant, NP   40 mg at 07/29/14 2026  . QUEtiapine (SEROQUEL XR) 24 hr tablet 600 mg  600 mg Oral Q2000 Saramma Eappen, MD   600 mg at 07/29/14 2026  . sodium chloride (OCEAN) 0.65 % nasal spray 1 spray  1 spray Each Nare PRN [name], MD   1 spray at  07/29/14 1828    Lab Results:  Results for orders placed or performed during the hospital encounter of 07/21/14 (from the past 48 hour(s))  Glucose, capillary     Status: Abnormal   Collection Time: 07/28/14  5:02 PM  Result Value Ref Range   Glucose-Capillary 447 (H) 70 - 99 mg/dL   Comment 1 Document in Chart    Comment 2 Repeat Test   Glucose, capillary     Status: Abnormal   Collection Time: 07/28/14  8:22 PM  Result Value Ref Range   Glucose-Capillary 160 (H) 70 - 99 mg/dL   Comment 1 Notify RN   Glucose, capillary     Status: Abnormal   Collection Time: 07/29/14  6:13 AM  Result Value Ref Range   Glucose-Capillary 262 (H) 70 - 99 mg/dL   Comment 1 Notify RN   Glucose, capillary     Status: Abnormal   Collection Time: 07/29/14 12:00 PM  Result Value Ref Range   Glucose-Capillary 218 (H) 70 - 99 mg/dL  Glucose, capillary     Status: Abnormal   Collection Time: 07/29/14  4:54 PM  Result Value Ref Range   Glucose-Capillary 250 (H) 70 - 99 mg/dL  Glucose, capillary     Status: Abnormal   Collection Time: 07/29/14  8:25 PM  Result Value Ref Range   Glucose-Capillary 177 (H) 70 - 99 mg/dL  Glucose, capillary     Status: Abnormal   Collection Time: 07/30/14  5:53 AM  Result Value Ref Range   Glucose-Capillary 207 (H) 70 - 99 mg/dL   Comment 1 Notify RN   Glucose, capillary     Status: Abnormal   Collection Time: 07/30/14 11:59 AM  Result Value Ref Range   Glucose-Capillary 312 (H) 70 - 99 mg/dL   Comment 1 Notify RN     Physical Findings: AIMS: Facial and Oral Movements Muscles of Facial  Expression: None, normal Lips and Perioral Area: None, normal Jaw: None, normal Tongue: None, normal,Extremity Movements Upper (arms, wrists, hands, fingers): None, normal Lower (legs, knees, ankles, toes): None, normal, Trunk Movements Neck, shoulders, hips: None, normal, Overall Severity Severity of abnormal movements (highest score from questions above): None,  normal Incapacitation due to abnormal movements: None, normal Patient's awareness of abnormal movements (rate only patient's report): No Awareness, Dental Status Current problems with teeth and/or dentures?: No Does patient usually wear dentures?: No  CIWA:  CIWA-Ar Total: 5 COWS:  COWS Total Score: 2  Assessment: Patient is an elderly AAF with hx of schizoaffective disorder, polysubstance abuse, presented with SI as well as command AH . Pt with continued sx of depression as well as VH.  Treatment Plan Summary: Daily contact with patient to assess and evaluate symptoms and progress in treatment and Medication management  Will continue CIWA TID as well as Ativan prn for CIWA>/= 15.Pt with some perceptual disturbances likely secondary to substance abuse -seeing spiders and bugs. Will continue Trileptal  1200 mg po daily. Will add a small dose of Celexa 10 mg po daily for affective sx. Will increase Seroquel to  600 mg po at 8.00 PM - pt requests it to be given at 8 pm . Will continue Luensta 1 mg po qhs for sleep. CSW will work on disposition.Patient with upcoming interviews for [facility]. Patient with multiple medical problems , will continue to monitor.  Medical Decision Making:  Review of Psycho-Social Stressors (1), Established Problem, Worsening (2), Review of Last Therapy Session (1) and Review of Medication Regimen & Side Effects (2)  Benjamine Mola, FNP-BC 07/30/2014, 3:04 PM   Agree with Assessment and Plan

## 2014-07-30 NOTE — [facility] Group Notes (Signed)
[facility] Group Notes:  (Clinical Social Work)  07/30/2014   11:15am-12:00pm  Summary of Progress/Problems:  The main focus of today's process group was to listen to a variety of genres of music and to identify that different types of music provoke different responses.  The patient then was able to identify personally what was soothing for them, as well as energizing.  Handouts were used to record feelings evoked, as well as how patient can personally use this knowledge in sleep habits, with depression, and with other symptoms.  The patient expressed understanding of concepts, as well as knowledge of how each type of music affected him/her and how this can be used at home as a wellness/recovery tool.  She did not share in group as much as she has been doing.  Type of Therapy:  Music Therapy   Participation Level:  Active  Participation Quality:  Attentive  Affect:  Blunted and Depressed  Cognitive:  Oriented  Insight:  Engaged  Engagement in Therapy:  Engaged  Modes of Intervention:   Activity, Exploration  Selmer Dominion, LCSW 07/30/2014, 12:30pm

## 2014-07-30 NOTE — Plan of Care (Signed)
Problem: Ineffective individual coping Goal: STG: Patient will remain free from self harm Outcome: Progressing Even though patient endorses command AH to harm self she has not acted on these voices and contracts for safety.   Problem: Diagnosis: Increased Risk For Suicide Attempt Goal: STG-Patient Will Comply With Medication Regime Outcome: Progressing Patient has been med compliant.

## 2014-07-31 LAB — HEMOGLOBIN A1C
HEMOGLOBIN A1C: 6.8 % — AB (ref 4.8–5.6)
MEAN PLASMA GLUCOSE: 148 mg/dL

## 2014-07-31 LAB — GLUCOSE, CAPILLARY
GLUCOSE-CAPILLARY: 194 mg/dL — AB (ref 70–99)
Glucose-Capillary: 263 mg/dL — ABNORMAL HIGH (ref 70–99)
Glucose-Capillary: 251 mg/dL — ABNORMAL HIGH (ref 70–99)
Glucose-Capillary: 174 mg/dL — ABNORMAL HIGH (ref 70–99)

## 2014-07-31 MED ORDER — CITALOPRAM HYDROBROMIDE 20 MG PO TABS
20.0000 mg | ORAL_TABLET | Freq: Every day | ORAL | Status: DC
  Administered 2014-08-01 – 2014-08-02 (×2): 20 mg via ORAL
  Filled 2014-07-31: qty 14
  Filled 2014-07-31 (×3): qty 1

## 2014-07-31 NOTE — Progress Notes (Signed)
Patient ID: Maureen Swanson, female   DOB: 1965/08/27, 49 y.o.   MRN: [id number]  D: Pt. Reports SI/HI and A/V hallucinations to this Probation officer. Patient reports, "it's going to be a bad day." Patient reports she can contract for safety. MD and LCSW  Patient reports generalized pain but patient refused PRN medication for pain. Patient rates her depression and anxiety at 8/10, and her hopelessness at 9/10. Patient reports sleep is fair, appetite is fair, energy level is low, and concentration level is low.  A: Support and encouragement provided to the patient to come to writer if she is feeling unsafe or feels as though she will harm herself. Patient was also encouraged to not isolate in her room. Scheduled medications administered to patient per physician's orders.  R: Patient is minimal with Probation officer and can be isolative at times. Patient is seen from time to time speaking with peers in the dayroom and attending some groups. Q15 minute checks are maintained for safety.

## 2014-07-31 NOTE — Progress Notes (Signed)
Patient ID: Maureen Swanson, female   DOB: 1965-09-27, 49 y.o.   MRN: [id number] [facility] MD Progress Note  07/31/2014 1:04 PM EVIANNA CHANDRAN  MRN:  [id number] Subjective:  Pt reports that her AH as well as VH comes and goes.Patient also reports feeling very depressed and anxious and having racing thoughts.  Objective: Pt seen and chart reviewed. Patient discussed with treatment team. I have also reviewed the notes from the last two days when patient was seen by weekend providers. Pt with limited improvement overall. Pt continues to have depressed affect as well as psychomotor retardation , walks slow with walker , seen minimally interactive with staff and peers. Pt seen in day room on and off, but has a depressed affect . Pt with ongoing Derma ,which comes and goes and which makes her suicidal when it does.Pt reports sleep as improved.   Her UDS was positive for cocaine on admission, BAL -128. CIWA reviewed . VS reviewed.  CSW working on referral process for substance abuse issues. Pt is motivated to get help.        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 Schizoaffective disorder, depressive type [F25.1] 07/24/2014  . Cocaine use disorder, moderate, dependence [F14.20]   . Depression [F32.9] 02/17/2014  . 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 Knee pain [M25.569] 10/17/2010  . HYPERGLYCEMIA [R73.09]  08/16/2010  . MAMMOGRAM, ABNORMAL, RIGHT [R92.8] 08/05/2010  . HYPERLIPIDEMIA [E78.5] 06/21/2010  . AMENORRHEA, SECONDARY [N91.2] 10/29/2009  . 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 HELICOBACTER PYLORI INFECTION [B96.81] 04/02/2009  . GERD [K21.9] 10/11/2007  . HYPERTENSION [I10] 02/26/2007   Total Time spent with patient: 30 minutes   Past Medical History:  Past Medical History  Diagnosis Date  . High cholesterol   . Depression   . Gout   . Anxiety   . Bipolar 1 disorder   . GERD (gastroesophageal reflux disease)   . Substance abuse     crack cocaine  . Headache(784.0)     migraines  . TMJ (temporomandibular joint disorder)   . Asthma     daily and prn inhalers  . Arthritis     back and knees  . Gastric ulcer   . 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 Hypertension     under control, has been on med. x 12 yrs.  . Diabetes mellitus     diet-controlled    Past Surgical History  Procedure Laterality Date  . Knee arthroscopy w/ partial medial meniscectomy  05/01/2010    right  . Breast lumpectomy with needle localization  04/19/2012    Procedure: BREAST LUMPECTOMY WITH NEEDLE LOCALIZATION;  Surgeon: [name], MD;  Location: [facility];  Service: General;  Laterality: Right;   Family History:  Family History  Problem Relation Age of Onset  . Diabetes Mother   . Cancer Mother     breast  . Cancer Father    Social History:  History  Alcohol Use  . 0.0 oz/week    Comment: "4-40 ounces/day"     History  Drug Use  . 1.00 per week  . Special: Cocaine    Comment: last use 08/02/2012    History   Social History  . Marital Status: Single    Spouse Name: N/A  . Number of Children: N/A  . Years of Education: N/A   Social History Main Topics  . Smoking status: Former Smoker -- 15 years    Quit date: 08/02/2012  . Smokeless tobacco: Never Used     Comment: quit smoking 02/2012  . Alcohol Use: 0.0 oz/week     Comment:  "4-40 ounces/day"  . Drug Use: 1.00 per week    Special: Cocaine     Comment: last use 08/02/2012  . Sexual Activity: No   Other Topics Concern  . None   Social History Narrative   Additional History:    Sleep: Fair  Appetite:  Fair   Musculoskeletal: Strength & Muscle Tone: within normal limits Gait & Station: with walker Patient leans: ambulates slow   Psychiatric Specialty Exam: Physical Exam  Vitals reviewed. Constitutional: She appears well-developed and well-nourished.  HENT:  Head: Normocephalic.    Review of Systems  Psychiatric/Behavioral: Positive for depression, suicidal ideas, hallucinations and substance abuse. The patient is nervous/anxious.     Blood pressure 125/88, pulse 98, temperature 98.5 F (36.9 C), temperature source Oral, resp. rate 18, height 5' 3.5" (1.613 m), weight 98.884 kg (218 lb), SpO2 100 %.Body mass index is 38.01 kg/(m^2).  General Appearance: Casual  Eye Contact::  Minimal  Speech:  Slow  Volume:  Decreased  Mood:  Depressed and Dysphoric  Affect:  Depressed  Thought Process:  Coherent  Orientation:  Full (Time, Place, and Person)  Thought Content:  Hallucinations: Auditory Visual comes and goes  Suicidal Thoughts:  passive SI ,whenever she has AH/VH.  Homicidal Thoughts:  No  Memory:  Immediate;   Fair Recent;   Fair  Judgement:  Poor  Insight:  Shallow  Psychomotor Activity:  Decreased  Concentration:  Fair  Recall:  [employer] of Knowledge:Fair  Language: Fair  Akathisia:  Negative  Handed:  Right  AIMS (if indicated):     Assets:  Others:  access to health care   ADL's:  Intact  Cognition: WNL  Sleep:  Number of Hours: 6.25     Current Medications: Current Facility-Administered Medications  Medication Dose Route Frequency Provider Last Rate Last Dose  . albuterol (PROVENTIL HFA;VENTOLIN HFA) 108 (90 BASE) MCG/ACT inhaler 2 puff  2 puff Inhalation Q6H PRN Delfin Gant, NP   2 puff at 07/31/14 0845  .  atenolol (TENORMIN) tablet 100 mg  100 mg Oral Daily Delfin Gant, NP   100 mg at 07/31/14 0845  . celecoxib (CELEBREX) capsule 100 mg  100 mg Oral BID Merian Capron, MD   100 mg at 07/31/14 0845  . [START ON 08/01/2014] citalopram (CELEXA) tablet 20 mg  20 mg Oral Daily Trevel Dillenbeck, MD      . eszopiclone (LUNESTA) tablet 1 mg  1 mg Oral QHS [name], MD   1 mg at 07/30/14 2142  . hydrochlorothiazide (HYDRODIURIL) tablet 25 mg  25 mg Oral Daily Delfin Gant, NP   25 mg at 07/31/14 0845  . hydrOXYzine (ATARAX/VISTARIL) tablet 25 mg  25 mg Oral Q6H PRN Delfin Gant, NP   25 mg at 07/29/14 2026  . ibuprofen (  ADVIL,MOTRIN) tablet 600 mg  600 mg Oral Q6H PRN Janett Labella, NP   600 mg at 07/30/14 1722  . insulin aspart (novoLOG) injection 0-20 Units  0-20 Units Subcutaneous TID WC Encarnacion Slates, NP   11 Units at 07/31/14 1204  . insulin aspart (novoLOG) injection 4 Units  4 Units Subcutaneous TID WC Encarnacion Slates, NP   4 Units at 07/31/14 1203  . loratadine (CLARITIN) tablet 10 mg  10 mg Oral Daily Delfin Gant, NP   10 mg at 07/31/14 0845  . LORazepam (ATIVAN) tablet 1 mg  1 mg Oral Q4H PRN [name], MD      . menthol-cetylpyridinium (CEPACOL) lozenge 3 mg  1 lozenge Oral PRN [name], MD      . metFORMIN (GLUCOPHAGE) tablet 500 mg  500 mg Oral BID WC Encarnacion Slates, NP   500 mg at 07/31/14 0845  . Oxcarbazepine (TRILEPTAL) tablet 600 mg  600 mg Oral BID [name], MD   600 mg at 07/31/14 0845  . pantoprazole (PROTONIX) EC tablet 40 mg  40 mg Oral QHS Delfin Gant, NP   40 mg at 07/30/14 2142  . QUEtiapine (SEROQUEL XR) 24 hr tablet 600 mg  600 mg Oral Q2000 Sheretta Grumbine, MD   600 mg at 07/30/14 1955  . sodium chloride (OCEAN) 0.65 % nasal spray 1 spray  1 spray Each Nare PRN [name], MD   1 spray at 07/31/14 0845    Lab Results:  Results for orders placed or performed during the hospital encounter of 07/21/14 (from the past 48  hour(s))  Glucose, capillary     Status: Abnormal   Collection Time: 07/29/14  4:54 PM  Result Value Ref Range   Glucose-Capillary 250 (H) 70 - 99 mg/dL  Glucose, capillary     Status: Abnormal   Collection Time: 07/29/14  8:25 PM  Result Value Ref Range   Glucose-Capillary 177 (H) 70 - 99 mg/dL  Glucose, capillary     Status: Abnormal   Collection Time: 07/30/14  5:53 AM  Result Value Ref Range   Glucose-Capillary 207 (H) 70 - 99 mg/dL   Comment 1 Notify RN   Glucose, capillary     Status: Abnormal   Collection Time: 07/30/14 11:59 AM  Result Value Ref Range   Glucose-Capillary 312 (H) 70 - 99 mg/dL   Comment 1 Notify RN   Glucose, capillary     Status: Abnormal   Collection Time: 07/30/14  5:02 PM  Result Value Ref Range   Glucose-Capillary 162 (H) 70 - 99 mg/dL  Glucose, capillary     Status: Abnormal   Collection Time: 07/30/14  8:40 PM  Result Value Ref Range   Glucose-Capillary 182 (H) 70 - 99 mg/dL  Glucose, capillary     Status: Abnormal   Collection Time: 07/31/14  6:34 AM  Result Value Ref Range   Glucose-Capillary 263 (H) 70 - 99 mg/dL  Glucose, capillary     Status: Abnormal   Collection Time: 07/31/14 11:43 AM  Result Value Ref Range   Glucose-Capillary 251 (H) 70 - 99 mg/dL    Physical Findings: AIMS: Facial and Oral Movements Muscles of Facial Expression: None, normal Lips and Perioral Area: None, normal Jaw: None, normal Tongue: None, normal,Extremity Movements Upper (arms, wrists, hands, fingers): None, normal Lower (legs, knees, ankles, toes): None, normal, Trunk Movements Neck, shoulders, hips: None, normal, Overall Severity Severity of abnormal movements (highest score from questions above):  None, normal Incapacitation due to abnormal movements: None, normal Patient's awareness of abnormal movements (rate only patient's report): No Awareness, Dental Status Current problems with teeth and/or dentures?: No Does patient usually wear dentures?: No   CIWA:  CIWA-Ar Total: 1 COWS:  COWS Total Score: 2  Assessment: Patient is an elderly AAF with hx of schizoaffective disorder, polysubstance abuse, presented with SI as well as command AH . Pt with continued sx of depression with some improvement in her [place] ,which comes and goes. Pt with passive SI.  Treatment Plan Summary: Daily contact with patient to assess and evaluate symptoms and progress in treatment and Medication management  Will continue CIWA TID as well as Ativan prn for CIWA>/= 15.Pt with some perceptual disturbances likely secondary to substance abuse -seeing spiders and bugs. Will continue Trileptal  1200 mg po daily. Will increase Celexa to 20  mg po daily for affective sx. Will continue Seroquel 600 mg po at 8.00 PM - pt requests it to be given at 8 pm .Discusse with patient that [place] ,could also be due to her substance abuse and she needs to be stay away from illicit substances to help the AH to subside.Pt motivated to get help and reports wanting to get in to a substance abuse program. Will continue Luensta 1 mg po qhs for sleep. CSW will work on disposition. Patient with multiple medical problems , will continue to monitor.  Medical Decision Making:  Review of Psycho-Social Stressors (1), Review and summation of old records (2), Review of Last Therapy Session (1), Review of Medication Regimen & Side Effects (2) and Review of New Medication or Change in Dosage (2)  Reana Chacko, MD 07/31/2014, 1:04 PM

## 2014-07-31 NOTE — Progress Notes (Signed)
D:Patient in the dayroom ap approach.  Patient speaks to Probation officer but does not engage in conversation.  Patient states her day was all right but would not elaborate.  Patient states her goal for today was to call her son.  Patient states she met her goal because she called her son and they has good conversation.  Patient denies SI/HI and denies AVH. A: Staff to monitor Q 15 mins for safety.  Encouragement and support offered.  Scheduled medications administered per orders. R: Patient remains safe on the unit.  Patient attended group tonight.  Patient visible on the unit and interacting with peers.  Patient taking administered medications.

## 2014-07-31 NOTE — BHH Group Notes (Signed)
[facility] LCSW Aftercare Discharge Planning Group Note   07/31/2014 3:03 PM  Participation Quality:  Minimal  Mood/Affect:  Depressed  Depression Rating:  10  Anxiety Rating:  10  Thoughts of Suicide:  Yes Will you contract for safety?   Yes  Current AVH:  Yes  Plan for Discharge/Comments:  "I'm having a bad day.  It feels like I am n slow motion and and I can't move forward.  My son came to visit me, but they would not let me see my grandaughter." We reviewed her plan A,B and C for discharge.  Transportation Means:   Supports:  [name]

## 2014-07-31 NOTE — BHH Group Notes (Signed)
[facility] LCSW Group Therapy  07/31/2014 1:15 pm  Type of Therapy: Process Group Therapy  Participation Level:  Active  Participation Quality:  Appropriate  Affect:  Flat  Cognitive:  Oriented  Insight:  Improving  Engagement in Group:  Limited  Engagement in Therapy:  Limited  Modes of Intervention:  Activity, Clarification, Education, Problem-solving and Support  Summary of Progress/Problems: Today's group addressed the issue of overcoming obstacles.  Patients were asked to identify their biggest obstacle post d/c that stands in the way of their on-going success, and then problem solve as to how to manage this.  Minimal participation.  Unable to identify obstacles.  I suggested that she is a survivor and that she has figured out how to keep going on.  She agreed, and atributes this to her faith and wanting to be a grandmother for her 3 grandchildren.  [name] 07/31/2014   3:05 PM

## 2014-07-31 NOTE — Plan of Care (Signed)
Problem: Alteration in mood Goal: STG-Patient reports thoughts of self-harm to staff Outcome: Completed/Met Date Met:  07/31/14 Patient able to speak with staff about self harm thoughts and can contract for safety

## 2014-07-31 NOTE — Progress Notes (Signed)
D: Patient remains visible at all times on day room watching TV and interacting with peers. Patient stated that the "voices comes and  Go". Continues to endorse chronic knee/knee pain of 5/10. Denies SI presently, but feels it "whenever I hears the voice".  A: Support and encouragement given to patient. Encouraged to verbalize needs to staff. Due medications given as ordered. Every 15 minutes check maintained for safety. Will continue to monitor patient for safety and stability. R: Patient is compliant with the treatment plan.

## 2014-07-31 NOTE — Progress Notes (Signed)
Adult Psychoeducational Group Note  Date:  07/31/2014 Time:  12:49 AM  Group Topic/Focus:  Wrap-Up Group:   The focus of this group is to help patients review their daily goal of treatment and discuss progress on daily workbooks.  Participation Level:  Active  Participation Quality:  Appropriate  Affect:  Appropriate  Cognitive:  Appropriate  Insight: Appropriate  Engagement in Group:  Engaged  Modes of Intervention:  Discussion  Additional Comments:THe patient expressed that she had a testing day because she talk to Dr and her family.The patient also said that she attended the music therapy group.  Nash Shearer 07/31/2014, 12:49 AM

## 2014-08-01 LAB — GLUCOSE, CAPILLARY
GLUCOSE-CAPILLARY: 209 mg/dL — AB (ref 70–99)
GLUCOSE-CAPILLARY: 175 mg/dL — AB (ref 70–99)
Glucose-Capillary: 210 mg/dL — ABNORMAL HIGH (ref 70–99)
Glucose-Capillary: 158 mg/dL — ABNORMAL HIGH (ref 70–99)

## 2014-08-01 MED ORDER — MUSCLE RUB 10-15 % EX CREA
TOPICAL_CREAM | CUTANEOUS | Status: DC | PRN
Start: 2014-08-01 — End: 2014-08-02
  Administered 2014-08-01 (×2): via TOPICAL
  Administered 2014-08-01: 1 via TOPICAL
  Administered 2014-08-02: 09:00:00 via TOPICAL
  Filled 2014-08-01: qty 85

## 2014-08-01 MED ORDER — ALUM & MAG HYDROXIDE-SIMETH 200-200-20 MG/5ML PO SUSP
30.0000 mL | ORAL | Status: DC | PRN
  Administered 2014-08-01: 30 mL via ORAL
  Filled 2014-08-01: qty 30

## 2014-08-01 MED ORDER — GUAIFENESIN 100 MG/5ML PO SYRP
200.0000 mg | ORAL_SOLUTION | Freq: Three times a day (TID) | ORAL | Status: DC | PRN
  Administered 2014-08-01 – 2014-08-02 (×2): 200 mg via ORAL
  Filled 2014-08-01 (×3): qty 10

## 2014-08-01 NOTE — Progress Notes (Signed)
Patient ID: Maureen Swanson, female   DOB: [birth year], 49 y.o.   MRN: [id number]  DAR: Pt. Denies SI/HI and A/V Hallucinations to this writer today.  Patient reports generalized pain and received PRN muscle rub cream and ibuprofen. Patient was encouraged to fill out daily inventory sheet but has not turned in one as of yet. Support and encouragement provided to the patient to not isolate. Scheduled medications administered to patient per physician's orders. Patient is receptive and cooperative with Probation officer. Patient is seen in the milieu interacting with peers and is attending some groups. Q15 minute checks are maintained for safety.

## 2014-08-01 NOTE — Progress Notes (Signed)
Patient ID: Maureen Swanson, female   DOB: April 01, 1966, 49 y.o.   MRN: [id number] [facility] MD Progress Note  08/01/2014 11:13 AM Maureen Swanson  MRN:  [id number] Subjective:  Pt reports that she is trying to talk to family to see if she can go and stay with them, she reports medications as working."  Objective: Pt seen and chart reviewed. Patient discussed with treatment team. Patient seen in her room today. Pt with somatic sx of having muscle spasms as well as arthritic pain issues. Discussed starting a topical medication for the same. Pt is motivated to get help with her substance abuse . Pt has a telephone interview at 1pm today . Pt otherwise denies any AH/VH/SI/HI. Pt did have sleep difficulty yesterday due to her room mate not switching off the light in the room.Did not endorse any medication side effects. AIMS - 0   CSW working on referral process for substance abuse issues.        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 Schizoaffective disorder, depressive type [F25.1] 07/24/2014  . Cocaine use disorder, moderate, dependence [F14.20]   . Depression [F32.9] 02/17/2014  . 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 Knee pain [M25.569] 10/17/2010  . HYPERGLYCEMIA [R73.09] 08/16/2010  . MAMMOGRAM, ABNORMAL, RIGHT [R92.8] 08/05/2010  . HYPERLIPIDEMIA [E78.5] 06/21/2010  . AMENORRHEA, SECONDARY [N91.2]  10/29/2009  . 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 HELICOBACTER PYLORI INFECTION [B96.81] 04/02/2009  . GERD [K21.9] 10/11/2007  . HYPERTENSION [I10] 02/26/2007   Total Time spent with patient: 30 minutes   Past Medical History:  Past Medical History  Diagnosis Date  . High cholesterol   . Depression   . Gout   . Anxiety   . Bipolar 1 disorder   . GERD (gastroesophageal reflux disease)   . Substance abuse     crack cocaine  . Headache(784.0)     migraines  . TMJ (temporomandibular joint disorder)   . Asthma     daily and prn inhalers  . Arthritis     back and knees  . Gastric ulcer   . 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 Hypertension     under control, has been on med. x 12 yrs.  . Diabetes mellitus     diet-controlled    Past Surgical History  Procedure Laterality Date  . Knee arthroscopy w/ partial medial meniscectomy  05/01/2010    right  . Breast lumpectomy with needle localization  04/19/2012    Procedure: BREAST LUMPECTOMY WITH NEEDLE LOCALIZATION;  Surgeon: [name], MD;  Location: [place];  Service: General;  Laterality: Right;   Family History:  Family History  Problem Relation Age of Onset  . Diabetes Mother   . Cancer Mother     breast  . Cancer Father    Social History:  History  Alcohol Use  . 0.0 oz/week    Comment: "4-40 ounces/day"     History  Drug Use  . 1.00 per week  . Special: Cocaine    Comment: last use 08/02/2012    History   Social History  . Marital Status: Single    Spouse Name: N/A  . Number of Children: N/A  . Years of Education: N/A   Social History Main Topics  . Smoking status: Former Smoker -- 15 years    Quit date: 08/02/2012  . Smokeless tobacco: Never Used     Comment: quit smoking 02/2012  . Alcohol Use: 0.0 oz/week     Comment: "4-40 ounces/day"  . Drug Use: 1.00 per week    Special: Cocaine     Comment: last use 08/02/2012  . Sexual Activity: No   Other  Topics Concern  . None   Social History Narrative   Additional History:    Sleep: Fair  Appetite:  Fair   Musculoskeletal: Strength & Muscle Tone: within normal limits Gait & Station: with walker Patient leans: ambulates slow   Psychiatric Specialty Exam: Physical Exam  Vitals reviewed. Constitutional: She appears well-developed and well-nourished.  HENT:  Head: Normocephalic.    Review of Systems  Psychiatric/Behavioral: Positive for depression (IMPROVING).    Blood pressure 137/88, pulse 111, temperature 99.2 F (37.3 C), temperature source Oral, resp. rate 20, height 5' 3.5" (1.613 m), weight 98.884 kg (218 lb), SpO2 100 %.Body mass index is 38.01 kg/(m^2).  General Appearance: Casual  Eye Contact::  Fair  Speech:  Slow  Volume:  Decreased  Mood:  Depressed and Dysphoric With some improvement  Affect:  Depressed  Thought Process:  Coherent  Orientation:  Full (Time, Place, and Person)  Thought Content:  Rumination  Suicidal Thoughts:  No  Homicidal Thoughts:  No  Memory:  Immediate;   Fair Recent;   Fair Remote;   Fair  Judgement:  Fair  Insight:  Shallow  Psychomotor Activity:  Decreased  Concentration:  Fair  Recall:  [employer] of Knowledge:Fair  Language: Fair  Akathisia:  Negative  Handed:  Right  AIMS (if indicated):     Assets:  Others:  access to health care   ADL's:  Intact  Cognition: WNL  Sleep:  Number of Hours: 6     Current Medications: Current Facility-Administered Medications  Medication Dose Route Frequency Provider Last Rate Last Dose  . albuterol (PROVENTIL HFA;VENTOLIN HFA) 108 (90 BASE) MCG/ACT inhaler 2 puff  2 puff Inhalation Q6H PRN Delfin Gant, NP   2 puff at 08/01/14 0802  . atenolol (TENORMIN) tablet 100 mg  100 mg Oral Daily Delfin Gant, NP   100 mg at 08/01/14 0803  . celecoxib (CELEBREX) capsule 100 mg  100 mg Oral BID Merian Capron, MD   100 mg at 08/01/14 0803  . citalopram (CELEXA) tablet 20 mg  20 mg  Oral Daily Dawne Casali, MD   20 mg at 08/01/14 0803  . eszopiclone (LUNESTA) tablet 1 mg  1 mg Oral QHS [name], MD   1 mg at 07/31/14 2209  . guaifenesin (ROBITUSSIN) 100 MG/5ML syrup 200 mg  200 mg Oral Q8H PRN Alden Bensinger, MD      . hydrochlorothiazide (HYDRODIURIL) tablet 25 mg  25 mg Oral Daily Delfin Gant, NP   25 mg at 08/01/14 0803  . hydrOXYzine (ATARAX/VISTARIL) tablet 25 mg  25 mg Oral Q6H PRN Delfin Gant, NP   25 mg at 07/31/14 2209  . ibuprofen (ADVIL,MOTRIN) tablet 600 mg  600 mg Oral Q6H PRN Freda Munro  Larose Kells, NP   600 mg at 08/01/14 0804  . insulin aspart (novoLOG) injection 0-20 Units  0-20 Units Subcutaneous TID WC Encarnacion Slates, NP   7 Units at 08/01/14 [phone]  . insulin aspart (novoLOG) injection 4 Units  4 Units Subcutaneous TID WC Encarnacion Slates, NP   4 Units at 08/01/14 [phone]  . loratadine (CLARITIN) tablet 10 mg  10 mg Oral Daily Delfin Gant, NP   10 mg at 08/01/14 0803  . LORazepam (ATIVAN) tablet 1 mg  1 mg Oral Q4H PRN [name], MD      . menthol-cetylpyridinium (CEPACOL) lozenge 3 mg  1 lozenge Oral PRN [name], MD      . metFORMIN (GLUCOPHAGE) tablet 500 mg  500 mg Oral BID WC Encarnacion Slates, NP   500 mg at 08/01/14 0803  . MUSCLE RUB CREA   Topical PRN [name], MD      . Oxcarbazepine (TRILEPTAL) tablet 600 mg  600 mg Oral BID [name], MD   600 mg at 08/01/14 0803  . pantoprazole (PROTONIX) EC tablet 40 mg  40 mg Oral QHS Delfin Gant, NP   40 mg at 07/31/14 2208  . QUEtiapine (SEROQUEL XR) 24 hr tablet 600 mg  600 mg Oral Q2000 Chastidy Ranker, MD   600 mg at 07/31/14 1954  . sodium chloride (OCEAN) 0.65 % nasal spray 1 spray  1 spray Each Nare PRN [name], MD   1 spray at 07/31/14 0845    Lab Results:  Results for orders placed or performed during the hospital encounter of 07/21/14 (from the past 48 hour(s))  Glucose, capillary     Status: Abnormal   Collection Time: 07/30/14 11:59 AM  Result  Value Ref Range   Glucose-Capillary 312 (H) 70 - 99 mg/dL   Comment 1 Notify RN   Glucose, capillary     Status: Abnormal   Collection Time: 07/30/14  5:02 PM  Result Value Ref Range   Glucose-Capillary 162 (H) 70 - 99 mg/dL  Glucose, capillary     Status: Abnormal   Collection Time: 07/30/14  8:40 PM  Result Value Ref Range   Glucose-Capillary 182 (H) 70 - 99 mg/dL  Glucose, capillary     Status: Abnormal   Collection Time: 07/31/14  6:34 AM  Result Value Ref Range   Glucose-Capillary 263 (H) 70 - 99 mg/dL  Glucose, capillary     Status: Abnormal   Collection Time: 07/31/14 11:43 AM  Result Value Ref Range   Glucose-Capillary 251 (H) 70 - 99 mg/dL  Glucose, capillary     Status: Abnormal   Collection Time: 07/31/14  5:01 PM  Result Value Ref Range   Glucose-Capillary 194 (H) 70 - 99 mg/dL  Glucose, capillary     Status: Abnormal   Collection Time: 07/31/14  8:55 PM  Result Value Ref Range   Glucose-Capillary 174 (H) 70 - 99 mg/dL   Comment 1 Notify RN   Glucose, capillary     Status: Abnormal   Collection Time: 08/01/14  6:14 AM  Result Value Ref Range   Glucose-Capillary 210 (H) 70 - 99 mg/dL    Physical Findings: AIMS: Facial and Oral Movements Muscles of Facial Expression: None, normal Lips and Perioral Area: None, normal Jaw: None, normal Tongue: None, normal,Extremity Movements Upper (arms, wrists, hands, fingers): None, normal Lower (legs, knees, ankles, toes): None, normal, Trunk Movements Neck, shoulders, hips: None, normal, Overall Severity Severity of abnormal movements (  highest score from questions above): None, normal Incapacitation due to abnormal movements: None, normal Patient's awareness of abnormal movements (rate only patient's report): No Awareness, Dental Status Current problems with teeth and/or dentures?: No Does patient usually wear dentures?: No  CIWA:  CIWA-Ar Total: 1 COWS:  COWS Total Score: 2  Assessment: Patient is an elderly AAF with  hx of schizoaffective disorder, polysubstance abuse, presented with SI as well as command AH . Pt with improvement of her depression as well as psychosis.Pt to be referred to a substance abuse program.  Treatment Plan Summary: Daily contact with patient to assess and evaluate symptoms and progress in treatment and Medication management  Will continue CIWA TID as well as Ativan prn for CIWA>/= 15.Pt with some perceptual disturbances likely secondary to substance abuse -seeing spiders and bugs. Will continue Trileptal  1200 mg po daily. Will continue Celexa 20  mg po daily for affective sx. Will continue Seroquel 600 mg po at 8.00 PM - pt requests it to be given at 8 pm . Will continue Luensta 1 mg po qhs for sleep. CSW will work on disposition. Patient with multiple medical problems , will continue to monitor.  Medical Decision Making:  Review of Psycho-Social Stressors (1), Review and summation of old records (2), Review of Last Therapy Session (1), Review of Medication Regimen & Side Effects (2) and Review of New Medication or Change in Dosage (2)  Eeshan Verbrugge, MD 08/01/2014, 11:13 AM

## 2014-08-01 NOTE — Progress Notes (Addendum)
D: Pt reports to be feeling "all right" this evening. Pt is currently denying any SI/HI/AVH. Pt is looking forward to her interview with [facility] on Thursday. Pt is present within the milieu but with minimal interactions with others. Pt is compliant with her current plan of care. Pt was concerned about the filling of her prescriptions upon discharge. Pt was informed that [facility] should be capable of filling her prescriptions but she may inquire about possible prescription samples to be given to her upon discharge. Pt shows awareness of her QHS medications and indications. Pt participated in wrap-up group.  A: Writer administered scheduled medications to pt, per Md orders. Continued support and availability as needed was extended to this pt. Staff continue to monitor pt with q77min checks.  R: No adverse drug reactions noted. Pt receptive to treatment. Pt remains safe at this time.

## 2014-08-01 NOTE — [facility] Group Notes (Signed)
[facility] Group Notes:  (Counselor/Nursing/MHT/Case Management/Adjunct)  08/01/2014 1:15PM  Type of Therapy:  Group Therapy  Participation Level:  Active  Participation Quality:  Appropriate  Affect:  Flat  Cognitive:  Oriented  Insight:  Improving  Engagement in Group:  Limited  Engagement in Therapy:  Limited  Modes of Intervention:  Discussion, Exploration and Socialization  Summary of Progress/Problems: The topic for group was balance in life.  Pt participated in the discussion about when their life was in balance and out of balance and how this feels.  Pt discussed ways to get back in balance and short term goals they can work on to get where they want to be. Jhanvi identified support in the way of her neighborhood in the past, and a church community as well.  States she has been isolating self more recently, and she knows that is bad for her.  Her son is still a support, and she wants to get re-connected with other supports.  She is generally checked out for much of group, and has to be apprised of what the question is when invited to join in.   [name] 08/01/2014 12:19 PM

## 2014-08-01 NOTE — Progress Notes (Signed)
Adult Psychoeducational Group Note  Date:  08/01/2014 Time:  8:58 PM  Group Topic/Focus:  Wrap-Up Group:   The focus of this group is to help patients review their daily goal of treatment and discuss progress on daily workbooks.  Participation Level:  Minimal  Participation Quality:  Appropriate  Affect:  Flat  Cognitive:  Oriented  Insight: Limited  Engagement in Group:  Engaged  Modes of Intervention:  Socialization and Support  Additional Comments:  Patient attended and participated in group tonight. She reports having an OK day. She reports that her day started out bad. She made a phone call to [facility] and was told they could not accept her into their program. He Education officer, museum then referred her to [employer]. She called and was accepted. She will be leaving on Thursday to [facility]. She is feeling much better now that she has some place to go because she did not want to return to her old environment. Today she did went for her meals and attended groups.  Salley [facility] 08/01/2014, 8:58 PM

## 2014-08-01 NOTE — Progress Notes (Addendum)
Physical Therapy Discharge Patient Details Name: Maureen Swanson MRN: [id number] DOB: 27-Mar-1966 Today's Date: 08/01/2014 Time:  -     Patient discharged from PT services secondary to goals met and no further PT needs identified.  Please see latest therapy progress note for current level of functioning and progress toward goals.    Progress and discharge plan discussed with patient .patient  In day room, continues to use RW, Recommend that patient have a RW if possible at DC.  Provided patient with exercise program for LE's.[facility] PT [phone]   GP     Claretha Cooper 08/01/2014, 6:02 PM

## 2014-08-01 NOTE — Progress Notes (Signed)
Adult Psychoeducational Group Note  Date:  08/01/2014 Time:  1:07 AM  Group Topic/Focus:  Wrap-Up Group:   The focus of this group is to help patients review their daily goal of treatment and discuss progress on daily workbooks.  Participation Level:  Active  Participation Quality:  Appropriate  Affect:  Appropriate  Cognitive:  Appropriate  Insight: Appropriate  Engagement in Group:  Engaged  Modes of Intervention:  Discussion  Additional Comments:  Pt stated she had a good day today and is working on getting into [employer] as her goal.  Clint Bolder 08/01/2014, 1:07 AM

## 2014-08-01 NOTE — Plan of Care (Signed)
Problem: Diagnosis: Increased Risk For Suicide Attempt Goal: LTG-Patient Will Report Improved Mood and Deny Suicidal LTG (by discharge) Patient will report improved mood and deny suicidal ideation.  Outcome: Completed/Met Date Met:  08/01/14 Patient reports improvement in her mood and denies SI today.

## 2014-08-01 NOTE — [facility] Group Notes (Signed)
[facility] Group Notes:  (Nursing/MHT/Case Management/Adjunct)  Date:  08/01/2014  Time:  10:45 AM  Type of Therapy:  Nurse Education  Participation Level:  Minimal  Participation Quality:  Appropriate  Affect:  Appropriate  Cognitive:  Alert  Insight:  Lacking  Engagement in Group:  Engaged  Modes of Intervention:  Discussion and Education  Summary of Progress/Problems: The purpose of this group is to discuss the topic of the day which is Recovery. Patient came into group late however was engaged after. Patient had no questions or concerns.  Margaretmary Bayley, Almena Hokenson E 08/01/2014, 10:45 AM

## 2014-08-01 NOTE — Tx Team (Addendum)
  Interdisciplinary Treatment Plan Update   Date Reviewed:  08/01/2014  Time Reviewed:  8:22 AM  Progress in Treatment:   Attending groups: Yes Participating in groups: Yes Taking medication as prescribed: Yes  Tolerating medication: Yes Family/Significant other contact made: Yes  Patient understands diagnosis: Yes  Discussing patient identified problems/goals with staff: Yes  See initial care plan Medical problems stabilized or resolved: Yes Denies suicidal/homicidal ideation: Yes  In tx team Patient has not harmed self or others: Yes  For review of initial/current patient goals, please see plan of care.  Estimated Length of Stay:  Likely d/c tomorrow  Reason for Continuation of Hospitalization:   New Problems/Goals identified:  N/A  Discharge Plan or Barriers:   [facility] rehab    Additional Comments: Maureen Swanson plans to plead her case to Dr to allow her to stay until Thursday AM for direct admission  Attendees:  Signature: Steva Colder, MD 08/01/2014 8:22 AM   Signature: Ripley Fraise, LCSW 08/01/2014 8:22 AM  Signature: Elmarie Shiley, NP 08/01/2014 8:22 AM  Signature: Gaylan Gerold, RN 08/01/2014 8:22 AM  Signature:  08/01/2014 8:22 AM  Signature:  08/01/2014 8:22 AM  Signature:   08/01/2014 8:22 AM  Signature:    Signature:    Signature:    Signature:    Signature:    Signature:      Scribe for Treatment Team:   Ripley Fraise, LCSW  08/01/2014 8:22 AM

## 2014-08-02 ENCOUNTER — Encounter (HOSPITAL_COMMUNITY): Payer: Self-pay | Admitting: Registered Nurse

## 2014-08-02 LAB — GLUCOSE, CAPILLARY
GLUCOSE-CAPILLARY: 226 mg/dL — AB (ref 70–99)
GLUCOSE-CAPILLARY: 254 mg/dL — AB (ref 70–99)

## 2014-08-02 MED ORDER — NICOTINE 21 MG/24HR TD PT24
21.0000 mg | MEDICATED_PATCH | Freq: Every day | TRANSDERMAL | Status: DC
  Administered 2014-08-02: 21 mg via TRANSDERMAL
  Filled 2014-08-02: qty 14
  Filled 2014-08-02 (×2): qty 1

## 2014-08-02 MED ORDER — CELECOXIB 100 MG PO CAPS: 100.0000 mg | ORAL_CAPSULE | Freq: Two times a day (BID) | ORAL | Status: DC

## 2014-08-02 MED ORDER — CITALOPRAM HYDROBROMIDE 20 MG PO TABS: 20.0000 mg | ORAL_TABLET | Freq: Every day | ORAL | Status: DC

## 2014-08-02 MED ORDER — HYDROXYZINE HCL 25 MG PO TABS: 25.0000 mg | ORAL_TABLET | Freq: Four times a day (QID) | ORAL | Status: DC | PRN

## 2014-08-02 MED ORDER — ZOLPIDEM TARTRATE 5 MG PO TABS: 5.0000 mg | ORAL_TABLET | Freq: Every day | ORAL | Status: DC

## 2014-08-02 MED ORDER — OMEPRAZOLE 20 MG PO CPDR: 20.0000 mg | DELAYED_RELEASE_CAPSULE | Freq: Every day | ORAL | Status: DC

## 2014-08-02 MED ORDER — OXCARBAZEPINE 600 MG PO TABS: 600.0000 mg | ORAL_TABLET | Freq: Two times a day (BID) | ORAL | Status: DC

## 2014-08-02 MED ORDER — ZOLPIDEM TARTRATE 5 MG PO TABS: 5.0000 mg | ORAL_TABLET | Freq: Every evening | ORAL | Status: DC | PRN

## 2014-08-02 MED ORDER — QUETIAPINE FUMARATE ER 300 MG PO TB24: 600.0000 mg | ORAL_TABLET | Freq: Every day | ORAL | Status: DC

## 2014-08-02 MED ORDER — NICOTINE 21 MG/24HR TD PT24: 21.0000 mg | MEDICATED_PATCH | Freq: Every day | TRANSDERMAL | Status: DC

## 2014-08-02 MED ORDER — METFORMIN HCL 500 MG PO TABS: 500.0000 mg | ORAL_TABLET | Freq: Every evening | ORAL | Status: DC

## 2014-08-02 NOTE — Discharge Summary (Signed)
Physician Discharge Summary Note  Patient:  Maureen Swanson is an 49 y.o., female MRN:  [id number] DOB:  [birth year] Patient phone:  [phone] (home)  Patient address:   [address] [place] [address],  Total Time spent with patient: Greater than 30 minutes  Date of Admission:  07/21/2014 Date of Discharge: 08/02/2014  Reason for Admission:  [place] female, 49 years old was admitted for feeling suicidal and having homicidal ideation towards her ex boyfriend. Patient reported that she took over dose of her Seroquel and Trileptal yesterday to kill herself. She reported previously cutting her wrist in the past. Patient admitted to using Marijuana and Cocaine occasionally. Patient was hospitalized last year at our [facility] last year for treatment of depression and Cocaine use. Patient reported increased feelings of depressive Symptoms; insomnia, feeling hopeless, helpless and stated that she is homeless. Patient also reported that she has not been taking her medications and gave no reasons for not. Patient sees a provider at [facility]. Patient admitted to diagnosis of Bipolar disorder and Chronic Depression. Patient cannot contract for safety at this time. She denies AVH.  Principal Problem: Schizoaffective disorder, depressive type Discharge Diagnoses: Patient Active Problem List   Diagnosis Date Noted  . Schizoaffective disorder, depressive type [F25.1] 07/24/2014  . Cocaine use disorder, moderate, dependence [F14.20]   . Depression [F32.9] 02/17/2014  . 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 Knee pain [M25.569] 10/17/2010  . HYPERGLYCEMIA [R73.09] 08/16/2010  . MAMMOGRAM, ABNORMAL, RIGHT [R92.8] 08/05/2010  . HYPERLIPIDEMIA [E78.5] 06/21/2010  . AMENORRHEA, SECONDARY [N91.2] 10/29/2009  . 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 HELICOBACTER PYLORI INFECTION [B96.81] 04/02/2009  . GERD [K21.9] 10/11/2007  . HYPERTENSION [I10] 02/26/2007     Musculoskeletal: Strength & Muscle Tone: within normal limits Gait & Station: unsteady Patient leans: Uses crutch  Psychiatric Specialty Exam:  See Suicide Risk Assessment  Physical Exam  Review of Systems  Psychiatric/Behavioral: Positive for substance abuse. Negative for suicidal ideas, hallucinations and memory loss. Depression: Stable. Nervous/anxious: Stable. Insomnia: Stable.     Blood pressure 124/89, pulse 97, temperature 98.7 F (37.1 C), temperature source Oral, resp. rate 20, height 5' 3.5" (1.613 m), weight 98.884 kg (218 lb), SpO2 100 %.Body mass index is 38.01 kg/(m^2).    Past Medical History:  Past Medical History  Diagnosis Date  . High cholesterol   . Depression   . Gout   . Anxiety   . Bipolar 1 disorder   . GERD (gastroesophageal reflux disease)   . Substance abuse     crack cocaine  . Headache(784.0)     migraines  . TMJ (temporomandibular joint disorder)   . Asthma     daily and prn inhalers  . Arthritis     back and knees  . Gastric ulcer   . 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 Hypertension     under control, has been on med. x 12 yrs.  . Diabetes mellitus     diet-controlled    Past Surgical History  Procedure Laterality Date  . Knee arthroscopy w/ partial medial meniscectomy  05/01/2010    right  . Breast lumpectomy with needle localization  04/19/2012    Procedure: BREAST LUMPECTOMY WITH NEEDLE LOCALIZATION;  Surgeon: [name], MD;  Location: [place];  Service: General;  Laterality: Right;   Family History:  Family History  Problem Relation Age of Onset  . Diabetes Mother   .  Cancer Mother     breast  . Cancer Father    Social History:  History  Alcohol Use  . 0.0 oz/week    Comment: "4-40 ounces/day"     History  Drug Use  . 1.00 per week  . Special: Cocaine    Comment: last use 08/02/2012    History   Social History  . Marital Status: Single    Spouse Name: N/A  .  Number of Children: N/A  . Years of Education: N/A   Social History Main Topics  . Smoking status: Former Smoker -- 15 years    Quit date: 08/02/2012  . Smokeless tobacco: Never Used     Comment: quit smoking 02/2012  . Alcohol Use: 0.0 oz/week     Comment: "4-40 ounces/day"  . Drug Use: 1.00 per week    Special: Cocaine     Comment: last use 08/02/2012  . Sexual Activity: No   Other Topics Concern  . None   Social History Narrative    Past Psychiatric History: Hospitalizations:  Outpatient Care:  Substance Abuse Care:  Self-Mutilation:  Suicidal Attempts:  Violent Behaviors:   Risk to Self: Is patient at risk for suicide?: Yes What has been your use of drugs/alcohol within the last 12 months?: $40 crack cocaine twice monthly and 3-4 40 OZ beers every other day for about 6 months Risk to Others:   Prior Inpatient Therapy:   Prior Outpatient Therapy:    Level of Care:  OP  Hospital Course:  Maureen Swanson was admitted for Schizoaffective disorder, depressive type and crisis management.  She was treated discharged with the medications listed below under Medication List.  Medical problems were identified and treated as needed.  Home medications were restarted as appropriate.  Improvement was monitored by observation and [name] daily report of symptom reduction.  Emotional and mental status was monitored by daily self-inventory reports completed by [name] and clinical staff.         Maureen Swanson was evaluated by the treatment team for stability and plans for continued recovery upon discharge.  Maureen Swanson motivation was an integral factor for scheduling further treatment.  Employment, transportation, bed availability, health status, family support, and any pending legal issues were also considered during her hospital stay.  She was offered further treatment options upon discharge including but not limited to Residential, Intensive Outpatient, and Outpatient treatment.   Maureen Swanson will follow up with the services as listed below under Follow Up Information.     Upon completion of this admission the patient was both mentally and medically stable for discharge denying suicidal/homicidal ideation, auditory/visual/tactile hallucinations, delusional thoughts and paranoia.      Consults:  psychiatry  Significant Diagnostic Studies:  labs: Capillary Glucose, CBC, CMET, ETOH, UDS  Discharge Vitals:   Blood pressure 124/89, pulse 97, temperature 98.7 F (37.1 C), temperature source Oral, resp. rate 20, height 5' 3.5" (1.613 m), weight 98.884 kg (218 lb), SpO2 100 %. Body mass index is 38.01 kg/(m^2). Lab Results:   Results for orders placed or performed during the hospital encounter of 07/21/14 (from the past 72 hour(s))  Glucose, capillary     Status: Abnormal   Collection Time: 07/30/14  5:02 PM  Result Value Ref Range   Glucose-Capillary 162 (H) 70 - 99 mg/dL  Glucose, capillary     Status: Abnormal   Collection Time: 07/30/14  8:40 PM  Result Value Ref Range   Glucose-Capillary  182 (H) 70 - 99 mg/dL  Glucose, capillary     Status: Abnormal   Collection Time: 07/31/14  6:34 AM  Result Value Ref Range   Glucose-Capillary 263 (H) 70 - 99 mg/dL  Glucose, capillary     Status: Abnormal   Collection Time: 07/31/14 11:43 AM  Result Value Ref Range   Glucose-Capillary 251 (H) 70 - 99 mg/dL  Glucose, capillary     Status: Abnormal   Collection Time: 07/31/14  5:01 PM  Result Value Ref Range   Glucose-Capillary 194 (H) 70 - 99 mg/dL  Glucose, capillary     Status: Abnormal   Collection Time: 07/31/14  8:55 PM  Result Value Ref Range   Glucose-Capillary 174 (H) 70 - 99 mg/dL   Comment 1 Notify RN   Glucose, capillary     Status: Abnormal   Collection Time: 08/01/14  6:14 AM  Result Value Ref Range   Glucose-Capillary 210 (H) 70 - 99 mg/dL  Glucose, capillary     Status: Abnormal   Collection Time: 08/01/14 12:00 PM  Result Value Ref Range    Glucose-Capillary 209 (H) 70 - 99 mg/dL   Comment 1 Notify RN   Glucose, capillary     Status: Abnormal   Collection Time: 08/01/14  5:00 PM  Result Value Ref Range   Glucose-Capillary 175 (H) 70 - 99 mg/dL   Comment 1 Document in Chart    Comment 2 Repeat Test   Glucose, capillary     Status: Abnormal   Collection Time: 08/01/14  8:29 PM  Result Value Ref Range   Glucose-Capillary 158 (H) 70 - 99 mg/dL   Comment 1 Notify RN    Comment 2 Call MD NNP PA CNM   Glucose, capillary     Status: Abnormal   Collection Time: 08/02/14  6:11 AM  Result Value Ref Range   Glucose-Capillary 226 (H) 70 - 99 mg/dL  Glucose, capillary     Status: Abnormal   Collection Time: 08/02/14 12:00 PM  Result Value Ref Range   Glucose-Capillary 254 (H) 70 - 99 mg/dL   Comment 1 Document in Chart    Comment 2 Repeat Test     Physical Findings: AIMS: Facial and Oral Movements Muscles of Facial Expression: None, normal Lips and Perioral Area: None, normal Jaw: None, normal Tongue: None, normal,Extremity Movements Upper (arms, wrists, hands, fingers): None, normal Lower (legs, knees, ankles, toes): None, normal, Trunk Movements Neck, shoulders, hips: None, normal, Overall Severity Severity of abnormal movements (highest score from questions above): None, normal Incapacitation due to abnormal movements: None, normal Patient's awareness of abnormal movements (rate only patient's report): No Awareness, Dental Status Current problems with teeth and/or dentures?: No Does patient usually wear dentures?: No  CIWA:  CIWA-Ar Total: 1 COWS:  COWS Total Score: 2   See Psychiatric Specialty Exam and Suicide Risk Assessment completed by Attending Physician prior to discharge.  Discharge destination:  Home  Is patient on multiple antipsychotic therapies at discharge:  No   Has Patient had three or more failed trials of antipsychotic monotherapy by history:  No    Recommended Plan for Multiple Antipsychotic  Therapies: NA      Discharge Instructions    Activity as tolerated - No restrictions    Complete by:  As directed      Diet Carb Modified    Complete by:  As directed      Discharge instructions    Complete by:  As directed  Take all of you medications as prescribed by your mental healthcare provider.  Report any adverse effects and reactions from your medications to your outpatient provider promptly. Do not engage in alcohol and or illegal drug use while on prescription medicines. In the event of worsening symptoms call the crisis hotline, 911, and or go to the nearest emergency department for appropriate evaluation and treatment of symptoms. Follow-up with your primary care provider for your medical issues, concerns and or health care needs.   Keep all scheduled appointments.  If you are unable to keep an appointment call to reschedule.  Let the nurse know if you will need medications before next scheduled appointment.            Medication List    STOP taking these medications        benztropine 1 MG tablet  Commonly known as:  COGENTIN     buPROPion 200 MG 12 hr tablet  Commonly known as:  WELLBUTRIN SR     Diclofenac Sodium CR 100 MG 24 hr tablet     lamoTRIgine 25 MG tablet  Commonly known as:  LAMICTAL     Oyster Shell Calcium 500 MG Tabs     polyethylene glycol powder powder  Commonly known as:  GLYCOLAX/MIRALAX     traMADol 50 MG tablet  Commonly known as:  ULTRAM     traZODone 100 MG tablet  Commonly known as:  DESYREL      TAKE these medications      Indication   albuterol 108 (90 BASE) MCG/ACT inhaler  Commonly known as:  PROVENTIL HFA;VENTOLIN HFA  Inhale 2 puffs into the lungs every 6 (six) hours as needed for wheezing or shortness of breath.   Indication:  Asthma, SOB     atenolol 100 MG tablet  Commonly known as:  TENORMIN  Take 1 tablet (100 mg total) by mouth daily. For hypertension   Indication:  High Blood Pressure     celecoxib 100 MG  capsule  Commonly known as:  CELEBREX  Take 1 capsule (100 mg total) by mouth 2 (two) times daily. For pain/arthritis   Indication:  Acute Pain, Arthritis     citalopram 20 MG tablet  Commonly known as:  CELEXA  Take 1 tablet (20 mg total) by mouth daily. For depression   Indication:  Depression     hydrochlorothiazide 25 MG tablet  Commonly known as:  HYDRODIURIL  Take 1 tablet (25 mg total) by mouth daily. For hypertension   Indication:  High Blood Pressure     hydrOXYzine 25 MG tablet  Commonly known as:  ATARAX/VISTARIL  Take 1 tablet (25 mg total) by mouth every 6 (six) hours as needed for anxiety.   Indication:  Tension, Anxiety     loratadine 10 MG tablet  Commonly known as:  CLARITIN  Take 1 tablet (10 mg total) by mouth daily. (May be purchased from over the counter): For allergies   Indication:  Perennial Rhinitis, Hayfever     metFORMIN 500 MG tablet  Commonly known as:  GLUCOPHAGE  Take 1 tablet (500 mg total) by mouth every evening. For Type 2 Diabetes      nicotine 21 mg/24hr patch  Commonly known as:  NICODERM CQ - dosed in mg/24 hours  Place 1 patch (21 mg total) onto the skin daily. For smoking cessation   Indication:  Nicotine Addiction     omeprazole 20 MG capsule  Commonly known as:  PRILOSEC  Take 1  capsule (20 mg total) by mouth daily. For GERD      oxcarbazepine 600 MG tablet  Commonly known as:  TRILEPTAL  Take 1 tablet (600 mg total) by mouth 2 (two) times daily. For mood stabilization   Indication:  Mood stabilization     QUEtiapine 300 MG 24 hr tablet  Commonly known as:  SEROQUEL XR  Take 2 tablets (600 mg total) by mouth daily at 8 pm. For depression and mood stabilization   Indication:  Major Depressive Disorder, Mood control       Follow-up Information    Follow up with [facility]  On 08/03/2014.   Why:  Thrusday Feb. 18th at 8:00 am for screening for admission.    Contact information:   [address] [place], [place] [address] Phone: [phone] Fax: [phone]       Follow-up recommendations:  Activity:  As tolerated Diet:  Low Carb  Comments:   Patient has been instructed to take medications as prescribed; and report adverse effects to outpatient provider.  Follow up with primary doctor for any medical issues and If symptoms recur report to nearest emergency or crisis hot line.    Total Discharge Time: Greater than 30 minutes  Signed: Earleen Newport, FNP-BC  08/02/2014, 1:49 PM

## 2014-08-02 NOTE — Progress Notes (Signed)
  [facility] Adult Case Management Discharge Plan :  Will you be returning to the same living situation after discharge:  No. At discharge, do you have transportation home?: Yes  Cab Do you have the ability to pay for your medications: Yes,  Mental Health   Release of information consent forms completed and in the chart;  Patient's signature needed at discharge.  Patient to Follow up at: Follow-up Information    Follow up with [facility]  On 08/03/2014.   Why:  Thrusday Feb. 18th at 8:00 am for screening for admission.    Contact information:   [address] [place], [place] [address] Phone: [phone] Fax: [phone]       Patient denies SI/HI: Yes,  Yes    Safety Planning and Suicide Prevention discussed: Yes,  With patient   Have you used any form of tobacco in the last 30 days? (Cigarettes, Smokeless Tobacco, Cigars, and/or Pipes): No  Has patient been referred to the Quitline?:Yes  FAXED 08/02/14  Hyatt,Candace 08/02/2014, 10:43 AM

## 2014-08-02 NOTE — Progress Notes (Signed)
Recreation Therapy Notes  INPATIENT RECREATION TR PLAN  Patient Details Name: Maureen Swanson MRN: [id number] DOB: [birth year] Today's Date: 08/02/2014  Rec Therapy Plan Is patient appropriate for Therapeutic Recreation?: Yes Treatment times per week: x1 Estimated Length of Stay: 3-5 days TR Treatment/Interventions: 1:1 session  Discharge Criteria Pt will be discharged from therapy if:: Discharged Treatment plan/goals/alternatives discussed and agreed upon by:: Patient/family  Discharge Summary Short term goals set: Patient will be able to identify at least one method for managing her ADL's post d/c by conclusion of recreation therapy tx.  Short term goals met: Complete Progress toward goals comments: One-to-one attended One-to-one attended: Work in 1:1 sessions focused on ADL management.  Reason goals not met: N/A Therapeutic equipment acquired: None Reason patient discharged from therapy: Discharge from hospital Pt/family agrees with progress & goals achieved: Yes Date patient discharged from therapy: 08/02/14   Lane Hacker, LRT/CTRS 08/02/2014, 8:16 AM

## 2014-08-02 NOTE — Progress Notes (Signed)
Recreation Therapy Notes  02.16.2016 @ approximately 4:00pm LRT approached patient in dayroom, where she was watching TV and socializing with peers. LRT invited patient to attend 1:1 session, patient refused, stating she did not want to. LRT made on additional attempt to engage with patient, however patient continued to refuse. Patient reports d/c anticipated for 02.17.2016.   Laureen Ochs Abundio Teuscher, LRT/CTRS  Sian Joles L 08/02/2014 8:11 AM

## 2014-08-02 NOTE — Progress Notes (Signed)
Patient ID: Maureen Swanson, female   DOB: 1965/06/21, 49 y.o.   MRN: [id number]  Pt. Denies HI and A/V hallucinations. Patient reports passive SI but reports that is chronic and there's no plan or means for her to harm herself. Patient contracts for safety. Belongings returned to patient at time of discharge. Patient denies any new onset of pain or discomfort. Discharge instructions and medications were reviewed with patient. Patient verbalized understanding of both medications and discharge instructions. Patient was discharged with a taxi voucher and was placed in taxi. No distress noted upon discharge. Q15 minute safety checks maintained until discharge.

## 2014-08-02 NOTE — [facility] Suicide Risk Assessment (Addendum)
[facility] Discharge Suicide Risk Assessment   Demographic Factors:  Unemployed  Total Time spent with patient: 30 minutes  Musculoskeletal: Strength & Muscle Tone: within normal limits Gait & Station: normal Patient leans: N/A  Psychiatric Specialty Exam: Physical Exam  Review of Systems  Constitutional: Negative.   HENT: Negative.   Eyes: Negative.   Respiratory: Negative.   Cardiovascular: Negative.   Gastrointestinal: Negative.   Genitourinary: Negative.   Musculoskeletal: Negative.   Skin: Negative.   Neurological: Negative.   Psychiatric/Behavioral: Positive for suicidal ideas (Patient states she has chronic SI ,which comes and goes , it never goes away, she contracts for safety , will call for help if she feels suicidal.). Negative for depression.    Blood pressure 124/89, pulse 97, temperature 98.7 F (37.1 C), temperature source Oral, resp. rate 20, height 5' 3.5" (1.613 m), weight 98.884 kg (218 lb), SpO2 100 %.Body mass index is 38.01 kg/(m^2).  General Appearance: Fairly Groomed  Engineer, water::  Fair  Speech:  Normal [id number]  Volume:  Normal  Mood:  Anxious about getting discharged , situational  Affect:  Constricted  Thought Process:  Goal Directed  Orientation:  Full (Time, Place, and Person)  Thought Content:  Rumination improved  Suicidal Thoughts:  Patient with chornic SI , comes and goes , reports it never goes away . she has learned to make use of coping skills, she will also call for help or go to the nearest ED if sx worsen.  Homicidal Thoughts:  Patient presented with HI towards her Ex boyfriend . Patient was admitted this time due to her separation from her boyfriend. Pt reports that she still feels like hurting him on and off , but reported that he lives on the other side of the town and she just wants to stay away from him.  Memory:  Immediate;   Fair Recent;   Fair Remote;   Fair  Judgement:  Fair  Insight:  Shallow  Psychomotor Activity:  Normal   Concentration:  Fair  Recall:  [employer] of Knowledge:Fair  Language: Fair  Akathisia:  No  Handed:  Right  AIMS (if indicated):     Assets:  Communication Skills Desire for Improvement  Sleep:  Number of Hours: 6.25  Cognition: WNL  ADL's:  Intact   Have you used any form of tobacco in the last 30 days? (Cigarettes, Smokeless Tobacco, Cigars, and/or Pipes): No  Has this patient used any form of tobacco in the last 30 days? (Cigarettes, Smokeless Tobacco, Cigars, and/or Pipes) Yes, will provide her nicotine patch.  Mental Status Per Nursing Assessment::   On Admission:  Suicidal ideation indicated by patient, Self-harm thoughts, Thoughts of violence towards others  Current Mental Status by Physician: Patient with chronic SI ,which comes and goes, patient reports she feels more hopeful since the past two days. Patient also would make use of coping skills and call 911/go to nearest ED to get help if her symptoms worsens. Patient was admitted for HI towards her Exboyfriend who threw her out of his house recently. Patient continues to feel hurt due his actions ,but wants to stay away from him.When asked if she was homicidal towards him ,she stated initially that she does feel like that some time , but then corrected and said that she just wants to stay away from him.  Loss Factors: Financial problems/change in socioeconomic status  Historical Factors: Impulsivity  Risk Reduction Factors:   Positive social support  Continued Clinical Symptoms:  Previous Psychiatric Diagnoses and Treatments Medical Diagnoses and Treatments/Surgeries  Cognitive Features That Contribute To Risk:  Thought constriction (tunnel vision)    Suicide Risk:  Acute risk is Minimal: Patient at this time denies SI. Patient has social support , is religious , has access to health care , is motivated to get help with her substance abuse, is african [place] and is female. Chronic risk : is moderate to high -  since she has previous suicide attempts , recent relational struggles with ex boyfriend , hx of substance abuse, is unemployed, chronic mental illness    Principal Problem: Schizoaffective disorder, depressive type Diagnosis: DSM 5 Primary psychiatric diagnosis  Schizoaffective disorder,Depressive type ,multiple episodes ,currently in (acute phase- resolved)   Secondary psychiatric diagnosis:  Alcohol use disorder, moderate  Stimulant use disorder , cocaine type  Non psychiatric diagnosis;  GERD (gastroesophageal reflux disease)  Migraines  TMJ (temporomandibular joint disorder)  Asthma  Arthritis  Gastric ulcer per hx  Atypical ductal hyperplasia of breast (03/2012)  Hypertension  Diabetes mellitus  Patient Active Problem List   Diagnosis Date Noted  . Schizoaffective disorder, depressive type [F25.1] 07/24/2014  . Cocaine use disorder, moderate, dependence [F14.20]   . Depression [F32.9] 02/17/2014  . 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 Knee pain [M25.569] 10/17/2010  . HYPERGLYCEMIA [R73.09] 08/16/2010  . MAMMOGRAM, ABNORMAL, RIGHT [R92.8] 08/05/2010  . HYPERLIPIDEMIA [E78.5] 06/21/2010  . AMENORRHEA, SECONDARY [N91.2] 10/29/2009  . 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 HELICOBACTER PYLORI INFECTION [B96.81] 04/02/2009  . GERD [K21.9] 10/11/2007  . HYPERTENSION [I10] 02/26/2007      Plan Of Care/Follow-up recommendations:  Activity:  no restrictions Diet: Carb modified Tests:  as needed Other:  follow up with aftercare, patient referred to [facility] for substance abuse care.  Is patient on multiple antipsychotic therapies at discharge:  No   Has Patient had three or more failed trials of antipsychotic monotherapy by history:  No  Recommended Plan for Multiple Antipsychotic Therapies: NA    Leoncio Hansen MD 08/02/2014, 9:41 AM

## 2014-08-02 NOTE — Plan of Care (Signed)
Problem: [facility] Participation in Recreation Therapeutic Interventions Goal: STG-Other Recreation Therapy Goal (Specify) Patient will be able to identify at least one method for managing her ADL's post d/c by conclusion of recreation therapy tx.  Outcome: Completed/Met Date Met:  08/02/14 02.17.2016 Patient successfully identified healthy activities she can use to help her stay focused on managing her ADL's during 1:1 recreation therapy tx. Brittain Smithey L Shimshon Narula, LRT/CTRS

## 2014-08-04 NOTE — Progress Notes (Signed)
Patient Discharge Instructions:  After Visit Summary (AVS):   Faxed to:  08/04/14 Discharge Summary Note:   Faxed to:  08/04/14 Psychiatric Admission Assessment Note:   Faxed to:  08/04/14 Suicide Risk Assessment - Discharge Assessment:   Faxed to:  08/04/14 Faxed/Sent to the Next Level Care provider:  08/04/14 Faxed to [facility]  @ [place], 08/04/2014, 4:04 PM

## 2014-08-15 ENCOUNTER — Encounter (HOSPITAL_BASED_OUTPATIENT_CLINIC_OR_DEPARTMENT_OTHER): Payer: Self-pay | Admitting: *Deleted

## 2014-08-15 ENCOUNTER — Emergency Department (HOSPITAL_BASED_OUTPATIENT_CLINIC_OR_DEPARTMENT_OTHER): Payer: Self-pay

## 2014-08-15 ENCOUNTER — Emergency Department (HOSPITAL_BASED_OUTPATIENT_CLINIC_OR_DEPARTMENT_OTHER)
Admission: EM | Admit: 2014-08-15 | Discharge: 2014-08-15 | Disposition: A | Discharge status: Home / Self Care | Payer: Self-pay | Attending: Emergency Medicine | Admitting: Emergency Medicine

## 2014-08-15 DIAGNOSIS — K219 Gastro-esophageal reflux disease without esophagitis: Secondary | ICD-10-CM | POA: Insufficient documentation

## 2014-08-15 DIAGNOSIS — Z880 Allergy status to penicillin: Secondary | ICD-10-CM | POA: Insufficient documentation

## 2014-08-15 DIAGNOSIS — Z87891 Personal history of nicotine dependence: Secondary | ICD-10-CM | POA: Insufficient documentation

## 2014-08-15 DIAGNOSIS — I10 Essential (primary) hypertension: Secondary | ICD-10-CM | POA: Insufficient documentation

## 2014-08-15 DIAGNOSIS — Z8742 Personal history of other diseases of the female genital tract: Secondary | ICD-10-CM | POA: Insufficient documentation

## 2014-08-15 DIAGNOSIS — R200 Anesthesia of skin: Secondary | ICD-10-CM | POA: Insufficient documentation

## 2014-08-15 DIAGNOSIS — R05 Cough: Secondary | ICD-10-CM | POA: Insufficient documentation

## 2014-08-15 DIAGNOSIS — Z8739 Personal history of other diseases of the musculoskeletal system and connective tissue: Secondary | ICD-10-CM | POA: Insufficient documentation

## 2014-08-15 DIAGNOSIS — R110 Nausea: Secondary | ICD-10-CM | POA: Insufficient documentation

## 2014-08-15 DIAGNOSIS — J45909 Unspecified asthma, uncomplicated: Secondary | ICD-10-CM | POA: Insufficient documentation

## 2014-08-15 DIAGNOSIS — E1165 Type 2 diabetes mellitus with hyperglycemia: Secondary | ICD-10-CM | POA: Insufficient documentation

## 2014-08-15 DIAGNOSIS — F419 Anxiety disorder, unspecified: Secondary | ICD-10-CM | POA: Insufficient documentation

## 2014-08-15 DIAGNOSIS — Z791 Long term (current) use of non-steroidal anti-inflammatories (NSAID): Secondary | ICD-10-CM | POA: Insufficient documentation

## 2014-08-15 DIAGNOSIS — Z79899 Other long term (current) drug therapy: Secondary | ICD-10-CM | POA: Insufficient documentation

## 2014-08-15 DIAGNOSIS — R739 Hyperglycemia, unspecified: Secondary | ICD-10-CM

## 2014-08-15 DIAGNOSIS — F319 Bipolar disorder, unspecified: Secondary | ICD-10-CM | POA: Insufficient documentation

## 2014-08-15 LAB — CBG MONITORING, ED
GLUCOSE-CAPILLARY: 248 mg/dL — AB (ref 70–99)
Glucose-Capillary: 212 mg/dL — ABNORMAL HIGH (ref 70–99)

## 2014-08-15 LAB — BASIC METABOLIC PANEL
Anion gap: 6 (ref 5–15)
BUN: 12 mg/dL (ref 6–23)
CO2: 26 mmol/L (ref 19–32)
Calcium: 9.3 mg/dL (ref 8.4–10.5)
Chloride: 102 mmol/L (ref 96–112)
Creatinine, Ser: 0.85 mg/dL (ref 0.50–1.10)
GFR, EST NON AFRICAN AMERICAN: 79 mL/min — AB (ref >90)
Glucose, Bld: 252 mg/dL — ABNORMAL HIGH (ref 70–99)
Potassium: 3.8 mmol/L (ref 3.5–5.1)
SODIUM: 134 mmol/L — AB (ref 135–145)

## 2014-08-15 MED ORDER — SODIUM CHLORIDE 0.9 % IV BOLUS (SEPSIS)
1000.0000 mL | Freq: Once | INTRAVENOUS | Status: AC
  Administered 2014-08-15: 1000 mL via INTRAVENOUS

## 2014-08-15 NOTE — ED Notes (Signed)
Pt is from [facility] c/o Increased BS 295

## 2014-08-15 NOTE — ED Notes (Signed)
MD at bedside for pt eval.

## 2014-08-15 NOTE — ED Provider Notes (Signed)
CSN: [id number]     Arrival date & time 08/15/14  1821 History  This chart was scribed for Malvin Johns, MD by Randa Evens, ED Scribe. This patient was seen in room MH09/MH09 and the patient's care was started at 8:01 PM.     Chief Complaint  Patient presents with  . Hyperglycemia   The history is provided by the patient. No language interpreter was used.   HPI Comments: Maureen Swanson is a 49 y.o. female with PMHx of diabetes and HTN who presents to the Emergency Department from [facility] for depression and alcohol abuse complaining of worsening hyperglycemia onset today. Pt states she has felt more nauseous, tingling in her feet and has had intermittent sweats. Pt states she also has a productive cough for the past week. Pt states she has recently started taking metformin for her diabetes which isnt not providing as much relief as she would like.   Pt states that today her blood sugars have been ranging from 310-365. Pt denies fever, vomiting or other related symptoms. Pt states she has an appointment with her PCP on 08/21/2014.   Past Medical History  Diagnosis Date  . High cholesterol   . Depression   . Gout   . Anxiety   . Bipolar 1 disorder   . GERD (gastroesophageal reflux disease)   . Substance abuse     crack cocaine  . Headache(784.0)     migraines  . TMJ (temporomandibular joint disorder)   . Asthma     daily and prn inhalers  . Arthritis     back and knees  . Gastric ulcer   . 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 Hypertension     under control, has been on med. x 12 yrs.  . Diabetes mellitus     diet-controlled   Past Surgical History  Procedure Laterality Date  . Knee arthroscopy w/ partial medial meniscectomy  05/01/2010    right  . Breast lumpectomy with needle localization  04/19/2012    Procedure: BREAST LUMPECTOMY WITH NEEDLE LOCALIZATION;  Surgeon: [name], MD;  Location: [place];  Service: General;   Laterality: Right;   Family History  Problem Relation Age of Onset  . Diabetes Mother   . Cancer Mother     breast  . Cancer Father    History  Substance Use Topics  . Smoking status: Former Smoker -- 15 years    Quit date: 08/02/2012  . Smokeless tobacco: Never Used     Comment: quit smoking 02/2012  . Alcohol Use: 0.0 oz/week     Comment: "4-40 ounces/day"   OB History    No data available      Review of Systems  Constitutional: Negative for fever, chills, diaphoresis and fatigue.  HENT: Negative for congestion, rhinorrhea and sneezing.   Eyes: Negative.   Respiratory: Positive for cough. Negative for chest tightness and shortness of breath.   Cardiovascular: Negative for chest pain and leg swelling.  Gastrointestinal: Positive for nausea. Negative for vomiting, abdominal pain, diarrhea and blood in stool.  Genitourinary: Negative for frequency, hematuria, flank pain and difficulty urinating.  Musculoskeletal: Negative for back pain and arthralgias.  Skin: Negative for rash.  Neurological: Positive for numbness (tingling ). Negative for dizziness, speech difficulty, weakness and headaches.     Allergies  Chocolate; Orange; Penicillins; and Tomato  Home Medications   Prior to Admission medications   Medication Sig Start Date End  Date Taking? Authorizing Provider  albuterol (PROVENTIL HFA;VENTOLIN HFA) 108 (90 BASE) MCG/ACT inhaler Inhale 2 puffs into the lungs every 6 (six) hours as needed for wheezing or shortness of breath. 03/02/14   Encarnacion Slates, NP  atenolol (TENORMIN) 100 MG tablet Take 1 tablet (100 mg total) by mouth daily. For hypertension 03/02/14   Encarnacion Slates, NP  celecoxib (CELEBREX) 100 MG capsule Take 1 capsule (100 mg total) by mouth 2 (two) times daily. For pain/arthritis 08/02/14   Shuvon Rankin, NP  citalopram (CELEXA) 20 MG tablet Take 1 tablet (20 mg total) by mouth daily. For depression 08/02/14   Shuvon Rankin, NP  hydrochlorothiazide  (HYDRODIURIL) 25 MG tablet Take 1 tablet (25 mg total) by mouth daily. For hypertension 03/02/14   Encarnacion Slates, NP  hydrOXYzine (ATARAX/VISTARIL) 25 MG tablet Take 1 tablet (25 mg total) by mouth every 6 (six) hours as needed for anxiety. 08/02/14   Shuvon Rankin, NP  loratadine (CLARITIN) 10 MG tablet Take 1 tablet (10 mg total) by mouth daily. (May be purchased from over the counter): For allergies Patient not taking: Reported on 07/02/2014 03/02/14   Encarnacion Slates, NP  metFORMIN (GLUCOPHAGE) 500 MG tablet Take 1 tablet (500 mg total) by mouth every evening. For Type 2 Diabetes 08/02/14   Shuvon Rankin, NP  nicotine (NICODERM CQ - DOSED IN MG/24 HOURS) 21 mg/24hr patch Place 1 patch (21 mg total) onto the skin daily. For smoking cessation 08/02/14   Shuvon Rankin, NP  omeprazole (PRILOSEC) 20 MG capsule Take 1 capsule (20 mg total) by mouth daily. For GERD 08/02/14   Shuvon Rankin, NP  oxcarbazepine (TRILEPTAL) 600 MG tablet Take 1 tablet (600 mg total) by mouth 2 (two) times daily. For mood stabilization 08/02/14   Shuvon Rankin, NP  QUEtiapine (SEROQUEL XR) 300 MG 24 hr tablet Take 2 tablets (600 mg total) by mouth daily at 8 pm. For depression and mood stabilization 08/02/14   Shuvon Rankin, NP   BP 160/96 mmHg  Pulse 78  Temp(Src) 98.7 F (37.1 C) (Oral)  Resp 20  Ht 5\' 4"  (1.626 m)  Wt 239 lb (108.41 kg)  BMI 41.00 kg/m2  SpO2 98%  LMP  (Approximate)   Physical Exam  Constitutional: She is oriented to person, place, and time. She appears well-developed and well-nourished.  HENT:  Head: Normocephalic and atraumatic.  Eyes: Pupils are equal, round, and reactive to light.  Neck: Normal range of motion. Neck supple.  Cardiovascular: Normal rate, regular rhythm and normal heart sounds.   Pulmonary/Chest: Effort normal and breath sounds normal. No respiratory distress. She has no wheezes. She has no rales. She exhibits no tenderness.  Abdominal: Soft. Bowel sounds are normal. There is no  tenderness. There is no rebound and no guarding.  Musculoskeletal: Normal range of motion. She exhibits no edema.  Lymphadenopathy:    She has no cervical adenopathy.  Neurological: She is alert and oriented to person, place, and time.  Skin: Skin is warm and dry. No rash noted.  Psychiatric: She has a normal mood and affect.    ED Course  Procedures (including critical care time) DIAGNOSTIC STUDIES: Oxygen Saturation is 100% on RA, normal by my interpretation.    COORDINATION OF CARE: 8:15 PM-Discussed treatment plan with pt at bedside and pt agreed to plan.     Labs Review Labs Reviewed  BASIC METABOLIC PANEL - Abnormal; Notable for the following:    Sodium 134 (*)    Glucose,  Bld 252 (*)    GFR calc non Af Amer 79 (*)    All other components within normal limits  CBG MONITORING, ED - Abnormal; Notable for the following:    Glucose-Capillary 248 (*)    All other components within normal limits  CBG MONITORING, ED - Abnormal; Notable for the following:    Glucose-Capillary 212 (*)    All other components within normal limits    Imaging Review Dg Chest 2 View  08/15/2014   CLINICAL DATA:  Worsening hyperglycemia with onset of symptoms today. Nausea. Intermittent sweating. Foot tingling. Productive cough.  EXAM: CHEST  2 VIEW  COMPARISON:  06/08/2014.  05/01/2010.  FINDINGS: Cardiopericardial silhouette within normal limits. Mediastinal contours normal. Trachea midline. No airspace disease or effusion. Mild basilar atelectasis bilaterally.  IMPRESSION: No active cardiopulmonary disease.   Electronically Signed   By: Dereck Ligas M.D.   On: 08/15/2014 20:43     EKG Interpretation None      MDM   Final diagnoses:  Hyperglycemia   Patient is well-appearing. Her sugar came down to 212 after some IV fluids. Her other electrolyte are unremarkable. Her chest x-ray does not show pneumonia. She's feeling better after IV fluids. [facility]. She has a  follow-up appointment with her primary care physician next week to reassess her management of her diabetes. For now she will continue the metformin.   I personally performed the services described in this documentation, which was scribed in my presence.  The recorded information has been reviewed and considered.      Malvin Johns, MD 08/15/14 2152

## 2014-08-15 NOTE — ED Notes (Signed)
Patient transported to X-ray 

## 2014-08-15 NOTE — Discharge Instructions (Signed)

## 2014-09-07 ENCOUNTER — Emergency Department (HOSPITAL_BASED_OUTPATIENT_CLINIC_OR_DEPARTMENT_OTHER)
Admission: EM | Admit: 2014-09-07 | Discharge: 2014-09-07 | Disposition: A | Discharge status: Home / Self Care | Payer: Self-pay | Attending: Emergency Medicine | Admitting: Emergency Medicine

## 2014-09-07 ENCOUNTER — Encounter (HOSPITAL_BASED_OUTPATIENT_CLINIC_OR_DEPARTMENT_OTHER): Payer: Self-pay | Admitting: *Deleted

## 2014-09-07 DIAGNOSIS — M25561 Pain in right knee: Secondary | ICD-10-CM | POA: Insufficient documentation

## 2014-09-07 DIAGNOSIS — Z8742 Personal history of other diseases of the female genital tract: Secondary | ICD-10-CM | POA: Insufficient documentation

## 2014-09-07 DIAGNOSIS — F419 Anxiety disorder, unspecified: Secondary | ICD-10-CM | POA: Insufficient documentation

## 2014-09-07 DIAGNOSIS — I10 Essential (primary) hypertension: Secondary | ICD-10-CM | POA: Insufficient documentation

## 2014-09-07 DIAGNOSIS — Z79899 Other long term (current) drug therapy: Secondary | ICD-10-CM | POA: Insufficient documentation

## 2014-09-07 DIAGNOSIS — M1990 Unspecified osteoarthritis, unspecified site: Secondary | ICD-10-CM

## 2014-09-07 DIAGNOSIS — G629 Polyneuropathy, unspecified: Secondary | ICD-10-CM | POA: Insufficient documentation

## 2014-09-07 DIAGNOSIS — Z87891 Personal history of nicotine dependence: Secondary | ICD-10-CM | POA: Insufficient documentation

## 2014-09-07 DIAGNOSIS — K219 Gastro-esophageal reflux disease without esophagitis: Secondary | ICD-10-CM | POA: Insufficient documentation

## 2014-09-07 DIAGNOSIS — M179 Osteoarthritis of knee, unspecified: Secondary | ICD-10-CM | POA: Insufficient documentation

## 2014-09-07 DIAGNOSIS — G43909 Migraine, unspecified, not intractable, without status migrainosus: Secondary | ICD-10-CM | POA: Insufficient documentation

## 2014-09-07 DIAGNOSIS — Z880 Allergy status to penicillin: Secondary | ICD-10-CM | POA: Insufficient documentation

## 2014-09-07 DIAGNOSIS — J45909 Unspecified asthma, uncomplicated: Secondary | ICD-10-CM | POA: Insufficient documentation

## 2014-09-07 DIAGNOSIS — F3131 Bipolar disorder, current episode depressed, mild: Secondary | ICD-10-CM | POA: Insufficient documentation

## 2014-09-07 DIAGNOSIS — E119 Type 2 diabetes mellitus without complications: Secondary | ICD-10-CM | POA: Insufficient documentation

## 2014-09-07 MED ORDER — KETOROLAC TROMETHAMINE 60 MG/2ML IM SOLN
60.0000 mg | Freq: Once | INTRAMUSCULAR | Status: AC
  Administered 2014-09-07: 60 mg via INTRAMUSCULAR
  Filled 2014-09-07: qty 2

## 2014-09-07 NOTE — ED Notes (Signed)
MD at bedside. 

## 2014-09-07 NOTE — ED Provider Notes (Signed)
CSN: [id number]     Arrival date & time 09/07/14  1730 History  This chart was scribed for Orpah Greek, MD by Peyton Bottoms, ED Scribe. This patient was seen in room MH09/MH09 and the patient's care was started at [place] PM.  Chief Complaint  Patient presents with  . Knee Pain   Patient is a 49 y.o. female presenting with knee pain. The history is provided by the patient. No language interpreter was used.  Knee Pain Location:  Knee and foot Knee location:  L knee and R knee Foot location:  L foot, R foot, sole of L foot and sole of R foot Pain details:    Quality:  Aching   Radiates to:  Does not radiate   Severity:  Moderate   Onset quality:  Gradual Chronicity:  Recurrent Dislocation: no     HPI Comments: Maureen Swanson is a 49 y.o. female from [facility] for alcoholism, with a PMHx of arthritis of her back and knees, hypertension, diabetes, GERD, headache, TMJ, bipolar disorder, anxiety, and high cholesterol, who presents to the Emergency Department complaining of moderate swelling and pain to knees and feet bilaterally which began earlier today. She denies associated fall or trauma. She states that she has had similar symptoms in the past.  Past Medical History  Diagnosis Date  . High cholesterol   . Depression   . Gout   . Anxiety   . Bipolar 1 disorder   . GERD (gastroesophageal reflux disease)   . Substance abuse     crack cocaine  . Headache(784.0)     migraines  . TMJ (temporomandibular joint disorder)   . Asthma     daily and prn inhalers  . Arthritis     back and knees  . Gastric ulcer   . 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 Hypertension     under control, has been on med. x 12 yrs.  . Diabetes mellitus     diet-controlled   Past Surgical History  Procedure Laterality Date  . Knee arthroscopy w/ partial medial meniscectomy  05/01/2010    right  . Breast lumpectomy with needle localization  04/19/2012    Procedure: BREAST  LUMPECTOMY WITH NEEDLE LOCALIZATION;  Surgeon: [name], MD;  Location: [place];  Service: General;  Laterality: Right;   Family History  Problem Relation Age of Onset  . Diabetes Mother   . Cancer Mother     breast  . Cancer Father    History  Substance Use Topics  . Smoking status: Former Smoker -- 15 years    Quit date: 08/02/2012  . Smokeless tobacco: Never Used     Comment: quit smoking 02/2012  . Alcohol Use: 0.0 oz/week     Comment: "4-40 ounces/day"   OB History    No data available     Review of Systems  Musculoskeletal: Positive for arthralgias.  All other systems reviewed and are negative.  Allergies  Chocolate; Orange; Penicillins; and Tomato  Home Medications   Prior to Admission medications   Medication Sig Start Date End Date Taking? Authorizing Provider  Gabapentin (NEURONTIN PO) Take by mouth.   Yes Historical Provider, MD  albuterol (PROVENTIL HFA;VENTOLIN HFA) 108 (90 BASE) MCG/ACT inhaler Inhale 2 puffs into the lungs every 6 (six) hours as needed for wheezing or shortness of breath. 03/02/14   Encarnacion Slates, NP  atenolol (TENORMIN) 100 MG tablet Take 1 tablet (  100 mg total) by mouth daily. For hypertension 03/02/14   Encarnacion Slates, NP  celecoxib (CELEBREX) 100 MG capsule Take 1 capsule (100 mg total) by mouth 2 (two) times daily. For pain/arthritis 08/02/14   Shuvon B Rankin, NP  citalopram (CELEXA) 20 MG tablet Take 1 tablet (20 mg total) by mouth daily. For depression 08/02/14   Shuvon B Rankin, NP  hydrochlorothiazide (HYDRODIURIL) 25 MG tablet Take 1 tablet (25 mg total) by mouth daily. For hypertension 03/02/14   Encarnacion Slates, NP  hydrOXYzine (ATARAX/VISTARIL) 25 MG tablet Take 1 tablet (25 mg total) by mouth every 6 (six) hours as needed for anxiety. 08/02/14   Shuvon B Rankin, NP  loratadine (CLARITIN) 10 MG tablet Take 1 tablet (10 mg total) by mouth daily. (May be purchased from over the counter): For allergies Patient not  taking: Reported on 07/02/2014 03/02/14   Encarnacion Slates, NP  metFORMIN (GLUCOPHAGE) 500 MG tablet Take 1 tablet (500 mg total) by mouth every evening. For Type 2 Diabetes 08/02/14   Shuvon B Rankin, NP  nicotine (NICODERM CQ - DOSED IN MG/24 HOURS) 21 mg/24hr patch Place 1 patch (21 mg total) onto the skin daily. For smoking cessation 08/02/14   Shuvon B Rankin, NP  omeprazole (PRILOSEC) 20 MG capsule Take 1 capsule (20 mg total) by mouth daily. For GERD 08/02/14   Shuvon B Rankin, NP  oxcarbazepine (TRILEPTAL) 600 MG tablet Take 1 tablet (600 mg total) by mouth 2 (two) times daily. For mood stabilization 08/02/14   Shuvon B Rankin, NP  QUEtiapine (SEROQUEL XR) 300 MG 24 hr tablet Take 2 tablets (600 mg total) by mouth daily at 8 pm. For depression and mood stabilization 08/02/14   Shuvon B Rankin, NP   Triage Vitals: BP 128/81 mmHg  Pulse 78  Temp(Src) 98.7 F (37.1 C) (Oral)  Resp 20  Ht 5\' 4"  (1.626 m)  Wt 239 lb (108.41 kg)  BMI 41.00 kg/m2  SpO2 99%  LMP  (Approximate)  Physical Exam  Constitutional: She is oriented to person, place, and time. She appears well-developed and well-nourished. No distress.  HENT:  Head: Normocephalic and atraumatic.  Right Ear: Hearing normal.  Left Ear: Hearing normal.  Nose: Nose normal.  Mouth/Throat: Oropharynx is clear and moist and mucous membranes are normal.  Eyes: Conjunctivae and EOM are normal. Pupils are equal, round, and reactive to light.  Neck: Normal range of motion. Neck supple.  Cardiovascular: Regular rhythm, S1 normal and S2 normal.  Exam reveals no gallop and no friction rub.   No murmur heard. Pulses:      Dorsalis pedis pulses are 2+ on the right side, and 2+ on the left side.  Pulmonary/Chest: Effort normal and breath sounds normal. No respiratory distress. She exhibits no tenderness.  Abdominal: Soft. Normal appearance and bowel sounds are normal. There is no hepatosplenomegaly. There is no tenderness. There is no rebound, no  guarding, no tenderness at McBurney's point and negative Murphy's sign. No hernia.  Musculoskeletal: Normal range of motion. She exhibits tenderness. She exhibits no edema.  Tenderness to both knees; no swelling noted.  Neurological: She is alert and oriented to person, place, and time. She has normal strength. No cranial nerve deficit or sensory deficit. Coordination normal. GCS eye subscore is 4. GCS verbal subscore is 5. GCS motor subscore is 6.  Skin: Skin is warm, dry and intact. No rash noted. No cyanosis.  Psychiatric: She has a normal mood and affect. Her speech  is normal and behavior is normal. Thought content normal.  Nursing note and vitals reviewed.  ED Course  Procedures (including critical care time)  DIAGNOSTIC STUDIES: Oxygen Saturation is 99% on RA, normal by my interpretation.    COORDINATION OF CARE: 6:19 PM- Discussed plans to review patient charts and follow up with patient. Will give patient Toradol injection 60 mg for pain management. Pt advised of plan for treatment and pt agrees.  Labs Review Labs Reviewed - No data to display  Imaging Review No results found.   EKG Interpretation None     MDM   Final diagnoses:  None  arthritis Neuropathy  Patient with previous history of arthritis and peripheral neuropathy presents to the ER for evaluation of pain in her knees and feet. Examination does not reveal anything that resembles infection. She does not have any joint effusions, swelling, erythema, warmth. Patient has not had any trauma. Patient has good distal blood flow. Patient is currently taking Neurontin and Celebrex for her pains. She reports this is not working. Patient is currently at [facility] for alcohol abuse. She was given an injection of Toradol here in the ER and will require further pain management as deemed appropriate by the staff at [facility].  I personally performed the services described in this documentation, which was scribed in my presence.  The recorded information has been reviewed and is accurate.    Orpah Greek, MD 09/07/14 [id number]

## 2014-09-07 NOTE — Discharge Instructions (Signed)
Arthritis, Nonspecific °Arthritis is inflammation of a joint. This usually means pain, redness, warmth or swelling are present. One or more joints may be involved. There are a number of types of arthritis. Your caregiver may not be able to tell what type of arthritis you have right away. °CAUSES  °The most common cause of arthritis is the wear and tear on the joint (osteoarthritis). This causes damage to the cartilage, which can break down over time. The knees, hips, back and neck are most often affected by this type of arthritis. °Other types of arthritis and common causes of joint pain include: °· Sprains and other injuries near the joint. Sometimes minor sprains and injuries cause pain and swelling that develop hours later. °· Rheumatoid arthritis. This affects hands, feet and knees. It usually affects both sides of your body at the same time. It is often associated with chronic ailments, fever, weight loss and general weakness. °· Crystal arthritis. Gout and pseudo gout can cause occasional acute severe pain, redness and swelling in the foot, ankle, or knee. °· Infectious arthritis. Bacteria can get into a joint through a break in overlying skin. This can cause infection of the joint. Bacteria and viruses can also spread through the blood and affect your joints. °· Drug, infectious and allergy reactions. Sometimes joints can become mildly painful and slightly swollen with these types of illnesses. °SYMPTOMS  °· Pain is the main symptom. °· Your joint or joints can also be red, swollen and warm or hot to the touch. °· You may have a fever with certain types of arthritis, or even feel overall ill. °· The joint with arthritis will hurt with movement. Stiffness is present with some types of arthritis. °DIAGNOSIS  °Your caregiver will suspect arthritis based on your description of your symptoms and on your exam. Testing may be needed to find the type of arthritis: °· Blood and sometimes urine tests. °· X-ray tests  and sometimes CT or MRI scans. °· Removal of fluid from the joint (arthrocentesis) is done to check for bacteria, crystals or other causes. Your caregiver (or a specialist) will numb the area over the joint with a local anesthetic, and use a needle to remove joint fluid for examination. This procedure is only minimally uncomfortable. °· Even with these tests, your caregiver may not be able to tell what kind of arthritis you have. Consultation with a specialist (rheumatologist) may be helpful. °TREATMENT  °Your caregiver will discuss with you treatment specific to your type of arthritis. If the specific type cannot be determined, then the following general recommendations may apply. °Treatment of severe joint pain includes: °· Rest. °· Elevation. °· Anti-inflammatory medication (for example, ibuprofen) may be prescribed. Avoiding activities that cause increased pain. °· Only take over-the-counter or prescription medicines for pain and discomfort as recommended by your caregiver. °· Cold packs over an inflamed joint may be used for 10 to 15 minutes every hour. Hot packs sometimes feel better, but do not use overnight. Do not use hot packs if you are diabetic without your caregiver's permission. °· A cortisone shot into arthritic joints may help reduce pain and swelling. °· Any acute arthritis that gets worse over the next 1 to 2 days needs to be looked at to be sure there is no joint infection. °Long-term arthritis treatment involves modifying activities and lifestyle to reduce joint stress jarring. This can include weight loss. Also, exercise is needed to nourish the joint cartilage and remove waste. This helps keep the muscles   around the joint strong. HOME CARE INSTRUCTIONS   Do not take aspirin to relieve pain if gout is suspected. This elevates uric acid levels.  Only take over-the-counter or prescription medicines for pain, discomfort or fever as directed by your caregiver.  Rest the joint as much as  possible.  If your joint is swollen, keep it elevated.  Use crutches if the painful joint is in your leg.  Drinking plenty of fluids may help for certain types of arthritis.  Follow your caregiver's dietary instructions.  Try low-impact exercise such as:  Swimming.  Water aerobics.  Biking.  Walking.  Morning stiffness is often relieved by a warm shower.  Put your joints through regular range-of-motion. SEEK MEDICAL CARE IF:   You do not feel better in 24 hours or are getting worse.  You have side effects to medications, or are not getting better with treatment. SEEK IMMEDIATE MEDICAL CARE IF:   You have a fever.  You develop severe joint pain, swelling or redness.  Many joints are involved and become painful and swollen.  There is severe back pain and/or leg weakness.  You have loss of bowel or bladder control. Document Released: 07/10/2004 Document Revised: 08/25/2011 Document Reviewed: 07/26/2008 [facility] Patient Information 2015 [place], [place]. This information is not intended to replace advice given to you by your health care provider. Make sure you discuss any questions you have with your health care provider.  Peripheral Neuropathy Peripheral neuropathy is a type of nerve damage. It affects nerves that carry signals between the spinal cord and other parts of the body. These are called peripheral nerves. With peripheral neuropathy, one nerve or a group of nerves may be damaged.  CAUSES  Many things can damage peripheral nerves. For some people with peripheral neuropathy, the cause is unknown. Some causes include:  Diabetes. This is the most common cause of peripheral neuropathy.  Injury to a nerve.  Pressure or stress on a nerve that lasts a long time.  Too little vitamin B. Alcoholism can lead to this.  Infections.  Autoimmune diseases, such as multiple sclerosis and systemic lupus erythematosus.  Inherited nerve diseases.  Some medicines, such as  cancer drugs.  Toxic substances, such as lead and mercury.  Too little blood flowing to the legs.  Kidney disease.  Thyroid disease. SIGNS AND SYMPTOMS  Different people have different symptoms. The symptoms you have will depend on which of your nerves is damaged. Common symptoms include:  Loss of feeling (numbness) in the feet and hands.  Tingling in the feet and hands.  Pain that burns.  Very sensitive skin.  Weakness.  Not being able to move a part of the body (paralysis).  Muscle twitching.  Clumsiness or poor coordination.  Loss of balance.  Not being able to control your bladder.  Feeling dizzy.  Sexual problems. DIAGNOSIS  Peripheral neuropathy is a symptom, not a disease. Finding the cause of peripheral neuropathy can be hard. To figure that out, your health care provider will take a medical history and do a physical exam. A neurological exam will also be done. This involves checking things affected by your brain, spinal cord, and nerves (nervous system). For example, your health care provider will check your reflexes, how you move, and what you can feel.  Other types of tests may also be ordered, such as:  Blood tests.  A test of the fluid in your spinal cord.  Imaging tests, such as CT scans or an MRI.  Electromyography (EMG).  This test checks the nerves that control muscles.  Nerve conduction velocity tests. These tests check how fast messages pass through your nerves.  Nerve biopsy. A small piece of nerve is removed. It is then checked under a microscope. TREATMENT   Medicine is often used to treat peripheral neuropathy. Medicines may include:  Pain-relieving medicines. Prescription or over-the-counter medicine may be suggested.  Antiseizure medicine. This may be used for pain.  Antidepressants. These also may help ease pain from neuropathy.  Lidocaine. This is a numbing medicine. You might wear a patch or be given a shot.  Mexiletine. This  medicine is typically used to help control irregular heart rhythms.  Surgery. Surgery may be needed to relieve pressure on a nerve or to destroy a nerve that is causing pain.  Physical therapy to help movement.  Assistive devices to help movement. HOME CARE INSTRUCTIONS   Only take over-the-counter or prescription medicines as directed by your health care provider. Follow the instructions carefully for any given medicines. Do not take any other medicines without first getting approval from your health care provider.  If you have diabetes, work closely with your health care provider to keep your blood sugar under control.  If you have numbness in your feet:  Check every day for signs of injury or infection. Watch for redness, warmth, and swelling.  Wear padded socks and comfortable shoes. These help protect your feet.  Do not do things that put pressure on your damaged nerve.  Do not smoke. Smoking keeps blood from getting to damaged nerves.  Avoid or limit alcohol. Too much alcohol can cause a lack of B vitamins. These vitamins are needed for healthy nerves.  Develop a good support system. Coping with peripheral neuropathy can be stressful. Talk to a mental health specialist or join a support group if you are struggling.  Follow up with your health care provider as directed. SEEK MEDICAL CARE IF:   You have new signs or symptoms of peripheral neuropathy.  You are struggling emotionally from dealing with peripheral neuropathy.  You have a fever. SEEK IMMEDIATE MEDICAL CARE IF:   You have an injury or infection that is not healing.  You feel very dizzy or begin vomiting.  You have chest pain.  You have trouble breathing. Document Released: 05/23/2002 Document Revised: 02/12/2011 Document Reviewed: 02/07/2013 [facility] Patient Information 2015 [place], [place]. This information is not intended to replace advice given to you by your health care provider. Make sure you discuss  any questions you have with your health care provider.

## 2014-09-07 NOTE — ED Notes (Signed)
Pain in the bottoms of her feet and knees. She is a patient at [facility]

## 2014-09-07 NOTE — ED Notes (Signed)
[id number] 

## 2014-09-08 LAB — CBG MONITORING, ED: Glucose-Capillary: 116 mg/dL — ABNORMAL HIGH (ref 70–99)

## 2014-09-14 ENCOUNTER — Encounter (HOSPITAL_BASED_OUTPATIENT_CLINIC_OR_DEPARTMENT_OTHER): Payer: Self-pay

## 2014-09-14 ENCOUNTER — Emergency Department (HOSPITAL_BASED_OUTPATIENT_CLINIC_OR_DEPARTMENT_OTHER): Payer: Self-pay

## 2014-09-14 ENCOUNTER — Emergency Department (HOSPITAL_BASED_OUTPATIENT_CLINIC_OR_DEPARTMENT_OTHER)
Admission: EM | Admit: 2014-09-14 | Discharge: 2014-09-14 | Disposition: A | Discharge status: Home / Self Care | Payer: Self-pay | Attending: Emergency Medicine | Admitting: Emergency Medicine

## 2014-09-14 DIAGNOSIS — Z880 Allergy status to penicillin: Secondary | ICD-10-CM | POA: Insufficient documentation

## 2014-09-14 DIAGNOSIS — Z3202 Encounter for pregnancy test, result negative: Secondary | ICD-10-CM | POA: Insufficient documentation

## 2014-09-14 DIAGNOSIS — E119 Type 2 diabetes mellitus without complications: Secondary | ICD-10-CM | POA: Insufficient documentation

## 2014-09-14 DIAGNOSIS — F419 Anxiety disorder, unspecified: Secondary | ICD-10-CM | POA: Insufficient documentation

## 2014-09-14 DIAGNOSIS — I10 Essential (primary) hypertension: Secondary | ICD-10-CM | POA: Insufficient documentation

## 2014-09-14 DIAGNOSIS — K5900 Constipation, unspecified: Secondary | ICD-10-CM | POA: Insufficient documentation

## 2014-09-14 DIAGNOSIS — J45909 Unspecified asthma, uncomplicated: Secondary | ICD-10-CM | POA: Insufficient documentation

## 2014-09-14 DIAGNOSIS — K219 Gastro-esophageal reflux disease without esophagitis: Secondary | ICD-10-CM | POA: Insufficient documentation

## 2014-09-14 DIAGNOSIS — Z87891 Personal history of nicotine dependence: Secondary | ICD-10-CM | POA: Insufficient documentation

## 2014-09-14 DIAGNOSIS — G43909 Migraine, unspecified, not intractable, without status migrainosus: Secondary | ICD-10-CM | POA: Insufficient documentation

## 2014-09-14 DIAGNOSIS — R109 Unspecified abdominal pain: Secondary | ICD-10-CM

## 2014-09-14 DIAGNOSIS — F319 Bipolar disorder, unspecified: Secondary | ICD-10-CM | POA: Insufficient documentation

## 2014-09-14 DIAGNOSIS — Z8739 Personal history of other diseases of the musculoskeletal system and connective tissue: Secondary | ICD-10-CM | POA: Insufficient documentation

## 2014-09-14 DIAGNOSIS — Z8742 Personal history of other diseases of the female genital tract: Secondary | ICD-10-CM | POA: Insufficient documentation

## 2014-09-14 LAB — URINALYSIS, ROUTINE W REFLEX MICROSCOPIC
BILIRUBIN URINE: NEGATIVE
Glucose, UA: NEGATIVE mg/dL
HGB URINE DIPSTICK: NEGATIVE
KETONES UR: NEGATIVE mg/dL
Nitrite: NEGATIVE
PROTEIN: NEGATIVE mg/dL
Specific Gravity, Urine: 1.011 (ref 1.005–1.030)
UROBILINOGEN UA: 0.2 mg/dL (ref 0.0–1.0)
pH: 5.5 (ref 5.0–8.0)

## 2014-09-14 LAB — CBC WITH DIFFERENTIAL/PLATELET
Basophils Absolute: 0 10*3/uL (ref 0.0–0.1)
Basophils Relative: 0 % (ref 0–1)
EOS PCT: 1 % (ref 0–5)
Eosinophils Absolute: 0.1 10*3/uL (ref 0.0–0.7)
HEMATOCRIT: 33.9 % — AB (ref 36.0–46.0)
HEMOGLOBIN: 10.9 g/dL — AB (ref 12.0–15.0)
LYMPHS ABS: 1.8 10*3/uL (ref 0.7–4.0)
LYMPHS PCT: 27 % (ref 12–46)
MCH: 27.7 pg (ref 26.0–34.0)
MCHC: 32.2 g/dL (ref 30.0–36.0)
MCV: 86 fL (ref 78.0–100.0)
MONO ABS: 0.5 10*3/uL (ref 0.1–1.0)
MONOS PCT: 8 % (ref 3–12)
Neutro Abs: 4.4 10*3/uL (ref 1.7–7.7)
Neutrophils Relative %: 64 % (ref 43–77)
Platelets: 243 10*3/uL (ref 150–400)
RBC: 3.94 MIL/uL (ref 3.87–5.11)
RDW: 12.4 % (ref 11.5–15.5)
WBC: 6.8 10*3/uL (ref 4.0–10.5)

## 2014-09-14 LAB — COMPREHENSIVE METABOLIC PANEL
ALT: 15 U/L (ref 0–35)
AST: 22 U/L (ref 0–37)
Albumin: 4.2 g/dL (ref 3.5–5.2)
Alkaline Phosphatase: 66 U/L (ref 39–117)
Anion gap: 10 (ref 5–15)
BUN: 17 mg/dL (ref 6–23)
CO2: 31 mmol/L (ref 19–32)
Calcium: 9.3 mg/dL (ref 8.4–10.5)
Chloride: 95 mmol/L — ABNORMAL LOW (ref 96–112)
Creatinine, Ser: 0.92 mg/dL (ref 0.50–1.10)
GFR calc Af Amer: 83 mL/min — ABNORMAL LOW (ref >90)
GFR calc non Af Amer: 72 mL/min — ABNORMAL LOW (ref >90)
GLUCOSE: 128 mg/dL — AB (ref 70–99)
Potassium: 3 mmol/L — ABNORMAL LOW (ref 3.5–5.1)
Sodium: 136 mmol/L (ref 135–145)
TOTAL PROTEIN: 7.5 g/dL (ref 6.0–8.3)

## 2014-09-14 LAB — URINE MICROSCOPIC-ADD ON

## 2014-09-14 LAB — LIPASE, BLOOD: Lipase: 28 U/L (ref 11–59)

## 2014-09-14 LAB — PREGNANCY, URINE: Preg Test, Ur: NEGATIVE

## 2014-09-14 MED ORDER — POLYETHYLENE GLYCOL 3350 17 GM/SCOOP PO POWD: 17.0000 g | Freq: Two times a day (BID) | ORAL | Status: DC

## 2014-09-14 MED ORDER — PANTOPRAZOLE SODIUM 20 MG PO TBEC: 20.0000 mg | DELAYED_RELEASE_TABLET | Freq: Every day | ORAL | Status: DC

## 2014-09-14 MED ORDER — SODIUM CHLORIDE 0.9 % IV BOLUS (SEPSIS)
1000.0000 mL | Freq: Once | INTRAVENOUS | Status: AC
  Administered 2014-09-14: 1000 mL via INTRAVENOUS

## 2014-09-14 MED ORDER — ONDANSETRON 4 MG PO TBDP: ORAL_TABLET | ORAL | Status: DC

## 2014-09-14 MED ORDER — ONDANSETRON HCL 4 MG/2ML IJ SOLN
4.0000 mg | Freq: Once | INTRAMUSCULAR | Status: AC
  Administered 2014-09-14: 4 mg via INTRAVENOUS
  Filled 2014-09-14: qty 2

## 2014-09-14 MED ORDER — POTASSIUM CHLORIDE CRYS ER 20 MEQ PO TBCR
40.0000 meq | EXTENDED_RELEASE_TABLET | Freq: Once | ORAL | Status: AC
  Administered 2014-09-14: 40 meq via ORAL
  Filled 2014-09-14: qty 2

## 2014-09-14 MED ORDER — RANITIDINE HCL 150 MG PO TABS: 150.0000 mg | ORAL_TABLET | Freq: Two times a day (BID) | ORAL | Status: DC

## 2014-09-14 MED ORDER — GI COCKTAIL ~~LOC~~
30.0000 mL | Freq: Once | ORAL | Status: AC
  Administered 2014-09-14: 30 mL via ORAL
  Filled 2014-09-14: qty 30

## 2014-09-14 NOTE — Discharge Instructions (Signed)
Abdominal Pain Many things can cause abdominal pain. Usually, abdominal pain is not caused by a disease and will improve without treatment. It can often be observed and treated at home. Your health care provider will do a physical exam and possibly order blood tests and X-rays to help determine the seriousness of your pain. However, in many cases, more time must pass before a clear cause of the pain can be found. Before that point, your health care provider may not know if you need more testing or further treatment. HOME CARE INSTRUCTIONS  Monitor your abdominal pain for any changes. The following actions may help to alleviate any discomfort you are experiencing:  Only take over-the-counter or prescription medicines as directed by your health care provider.  Do not take laxatives unless directed to do so by your health care provider.  Try a clear liquid diet (broth, tea, or water) as directed by your health care provider. Slowly move to a bland diet as tolerated. SEEK MEDICAL CARE IF:  You have unexplained abdominal pain.  You have abdominal pain associated with nausea or diarrhea.  You have pain when you urinate or have a bowel movement.  You experience abdominal pain that wakes you in the night.  You have abdominal pain that is worsened or improved by eating food.  You have abdominal pain that is worsened with eating fatty foods.  You have a fever. SEEK IMMEDIATE MEDICAL CARE IF:   Your pain does not go away within 2 hours.  You keep throwing up (vomiting).  Your pain is felt only in portions of the abdomen, such as the right side or the left lower portion of the abdomen.  You pass bloody or black tarry stools. MAKE SURE YOU:  Understand these instructions.   Will watch your condition.   Will get help right away if you are not doing well or get worse.  Document Released: 03/12/2005 Document Revised: 06/07/2013 Document Reviewed: 02/09/2013 [facility] Patient Information  2015 [place], [place]. This information is not intended to replace advice given to you by your health care provider. Make sure you discuss any questions you have with your health care provider.  Constipation Constipation is when a person:  Poops (has a bowel movement) less than 3 times a week.  Has a hard time pooping.  Has poop that is dry, hard, or bigger than normal. HOME CARE   Eat foods with a lot of fiber in them. This includes fruits, vegetables, beans, and whole grains such as brown rice.  Avoid fatty foods and foods with a lot of sugar. This includes french fries, hamburgers, cookies, candy, and soda.  If you are not getting enough fiber from food, take products with added fiber in them (supplements).  Drink enough fluid to keep your pee (urine) clear or pale yellow.  Exercise on a regular basis, or as told by your doctor.  Go to the restroom when you feel like you need to poop. Do not hold it.  Only take medicine as told by your doctor. Do not take medicines that help you poop (laxatives) without talking to your doctor first. GET HELP RIGHT AWAY IF:   You have bright red blood in your poop (stool).  Your constipation lasts more than 4 days or gets worse.  You have belly (abdominal) or butt (rectal) pain.  You have thin poop (as thin as a pencil).  You lose weight, and it cannot be explained. MAKE SURE YOU:   Understand these instructions.  Will  watch your condition.  Will get help right away if you are not doing well or get worse. Document Released: 11/19/2007 Document Revised: 06/07/2013 Document Reviewed: 03/14/2013 [facility] Patient Information 2015 [place], [place]. This information is not intended to replace advice given to you by your health care provider. Make sure you discuss any questions you have with your health care provider.  Bloating Bloating is the feeling of fullness in your belly. You may feel as though your pants are too tight. Often the cause of  bloating is overeating, retaining fluids, or having gas in your bowel. It is also caused by swallowing air and eating foods that cause gas. Irritable bowel syndrome is one of the most common causes of bloating. Constipation is also a common cause. Sometimes more serious problems can cause bloating. SYMPTOMS  Usually there is a feeling of fullness, as though your abdomen is bulged out. There may be mild discomfort.  DIAGNOSIS  Usually no particular testing is necessary for most bloating. If the condition persists and seems to become worse, your caregiver may do additional testing.  TREATMENT   There is no direct treatment for bloating.  Do not put gas into the bowel. Avoid chewing gum and sucking on candy. These tend to make you swallow air. Swallowing air can also be a nervous habit. Try to avoid this.  Avoiding high residue diets will help. Eat foods with soluble fibers (examples include root vegetables, apples, or barley) and substitute dairy products with soy and rice products. This helps irritable bowel syndrome.  If constipation is the cause, then a high residue diet with more fiber will help.  Avoid carbonated beverages.  Over-the-counter preparations are available that help reduce gas. Your pharmacist can help you with this. SEEK MEDICAL CARE IF:   Bloating continues and seems to be getting worse.  You notice a weight gain.  You have a weight loss but the bloating is getting worse.  You have changes in your bowel habits or develop nausea or vomiting. SEEK IMMEDIATE MEDICAL CARE IF:   You develop shortness of breath or swelling in your legs.  You have an increase in abdominal pain or develop chest pain. Document Released: 04/02/2006 Document Revised: 08/25/2011 Document Reviewed: 05/21/2007 [facility] Patient Information 2015 [place], [place]. This information is not intended to replace advice given to you by your health care provider. Make sure you discuss any questions you have  with your health care provider.   Your evaluated in the ED today for your abdominal discomfort. There does not appear to be an emergent cause for your symptoms at this time. You reported feeling better after receiving antacids and antinausea medicine in the ED. You will be prescribed the same medications to take at home. You will need to take MiraLAX as directed and will need to be close to a bathroom. Please follow-up with your primary care for further evaluation and management of your symptoms. Return to ED for new or worsening symptoms.

## 2014-09-14 NOTE — ED Notes (Signed)
PA at bedside to discuss results of testing.  Crackers and ginger ale offered.

## 2014-09-14 NOTE — ED Notes (Addendum)
C/o abd pain x 4-5 days-n/v/d-pt is from [facility] (day 42) for ETOH and drug abuse-pt states she also wants to be seen for pain to her left knee

## 2014-09-14 NOTE — ED Notes (Addendum)
Pt asking why provider did not xray L knee, stating that if she does not get xray'd today will "just come back another day". Provider notified.  Pt tolerated po challenge without problems, ate 3 packs crackers and soda.

## 2014-09-14 NOTE — ED Notes (Signed)
MD at bedside. 

## 2014-09-14 NOTE — ED Provider Notes (Signed)
CSN: [id number]     Arrival date & time 09/14/14  1039 History   First MD Initiated Contact with Patient 09/14/14 1217     Chief Complaint  Patient presents with  . Abdominal Pain     (Consider location/radiation/quality/duration/timing/severity/associated sxs/prior Treatment) HPI Maureen Swanson is a 49 y.o. female history of GERD, polysubstance abuse, alcohol abuse comes in for evaluation of abdominal discomfort. Patient states for the past for 5 days she has experienced a intermittent burning sensation throughout her entire abdomen. This sensation is worsened with eating. She states she took some Mylanta which improved her symptoms somewhat. She also reports associated constipation for which she took citrate which provided a little bit of relief. She reports 2 episodes of vomiting, nonbloody nonbilious. She rates her discomfort now as a 9/10. She also reports that she is in day 42 for treatment of alcohol and drug abuse at [facility]. She also reports associated urinary frequency without dysuria or hematuria. Denies fevers, chest pain, shortness of breath, headaches.  Past Medical History  Diagnosis Date  . High cholesterol   . Depression   . Gout   . Anxiety   . Bipolar 1 disorder   . GERD (gastroesophageal reflux disease)   . Substance abuse     crack cocaine  . Headache(784.0)     migraines  . TMJ (temporomandibular joint disorder)   . Asthma     daily and prn inhalers  . Arthritis     back and knees  . Gastric ulcer   . 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 Hypertension     under control, has been on med. x 12 yrs.  . Diabetes mellitus     diet-controlled   Past Surgical History  Procedure Laterality Date  . Knee arthroscopy w/ partial medial meniscectomy  05/01/2010    right  . Breast lumpectomy with needle localization  04/19/2012    Procedure: BREAST LUMPECTOMY WITH NEEDLE LOCALIZATION;  Surgeon: [name], MD;  Location: [place];  Service: General;  Laterality: Right;   Family History  Problem Relation Age of Onset  . Diabetes Mother   . Cancer Mother     breast  . Cancer Father    History  Substance Use Topics  . Smoking status: Former Smoker -- 15 years    Quit date: 08/02/2012  . Smokeless tobacco: Never Used     Comment: quit smoking 02/2012  . Alcohol Use: No   OB History    No data available     Review of Systems A 10 point review of systems was completed and was negative except for pertinent positives and negatives as mentioned in the history of present illness     Allergies  Chocolate; Orange; Penicillins; and Tomato  Home Medications   Prior to Admission medications   Medication Sig Start Date End Date Taking? Authorizing Provider  albuterol (PROVENTIL HFA;VENTOLIN HFA) 108 (90 BASE) MCG/ACT inhaler Inhale 2 puffs into the lungs every 6 (six) hours as needed for wheezing or shortness of breath. 03/02/14   Encarnacion Slates, NP  atenolol (TENORMIN) 100 MG tablet Take 1 tablet (100 mg total) by mouth daily. For hypertension 03/02/14   Encarnacion Slates, NP  celecoxib (CELEBREX) 100 MG capsule Take 1 capsule (100 mg total) by mouth 2 (two) times daily. For pain/arthritis 08/02/14   Shuvon B Rankin, NP  citalopram (CELEXA) 20 MG tablet Take 1 tablet (20 mg  total) by mouth daily. For depression 08/02/14   Shuvon B Rankin, NP  Gabapentin (NEURONTIN PO) Take by mouth.    Historical Provider, MD  hydrochlorothiazide (HYDRODIURIL) 25 MG tablet Take 1 tablet (25 mg total) by mouth daily. For hypertension 03/02/14   Encarnacion Slates, NP  hydrOXYzine (ATARAX/VISTARIL) 25 MG tablet Take 1 tablet (25 mg total) by mouth every 6 (six) hours as needed for anxiety. 08/02/14   Shuvon B Rankin, NP  loratadine (CLARITIN) 10 MG tablet Take 1 tablet (10 mg total) by mouth daily. (May be purchased from over the counter): For allergies Patient not taking: Reported on 07/02/2014 03/02/14   Encarnacion Slates, NP  metFORMIN  (GLUCOPHAGE) 500 MG tablet Take 1 tablet (500 mg total) by mouth every evening. For Type 2 Diabetes 08/02/14   Shuvon B Rankin, NP  nicotine (NICODERM CQ - DOSED IN MG/24 HOURS) 21 mg/24hr patch Place 1 patch (21 mg total) onto the skin daily. For smoking cessation 08/02/14   Shuvon B Rankin, NP  omeprazole (PRILOSEC) 20 MG capsule Take 1 capsule (20 mg total) by mouth daily. For GERD 08/02/14   Shuvon B Rankin, NP  ondansetron (ZOFRAN ODT) 4 MG disintegrating tablet 4mg  ODT q4 hours prn nausea/vomit 09/14/14   Comer Locket, PA-C  oxcarbazepine (TRILEPTAL) 600 MG tablet Take 1 tablet (600 mg total) by mouth 2 (two) times daily. For mood stabilization 08/02/14   Shuvon B Rankin, NP  pantoprazole (PROTONIX) 20 MG tablet Take 1 tablet (20 mg total) by mouth daily. 09/14/14   Comer Locket, PA-C  polyethylene glycol powder (GLYCOLAX/MIRALAX) powder Take 17 g by mouth 2 (two) times daily. Until daily soft stools  OTC 09/14/14   Comer Locket, PA-C  QUEtiapine (SEROQUEL XR) 300 MG 24 hr tablet Take 2 tablets (600 mg total) by mouth daily at 8 pm. For depression and mood stabilization 08/02/14   Shuvon B Rankin, NP  ranitidine (ZANTAC) 150 MG tablet Take 1 tablet (150 mg total) by mouth 2 (two) times daily. 09/14/14   Izek Corvino, PA-C   BP 113/61 mmHg  Pulse 70  Temp(Src) 98.3 F (36.8 C) (Oral)  Resp 16  Ht 5\' 4"  (1.626 m)  Wt 239 lb (108.41 kg)  BMI 41.00 kg/m2  SpO2 95%  LMP  (Approximate) Physical Exam  Constitutional: She is oriented to person, place, and time. She appears well-developed and well-nourished.  Obese  HENT:  Head: Normocephalic and atraumatic.  Mouth/Throat: Oropharynx is clear and moist.  Eyes: Conjunctivae are normal. Pupils are equal, round, and reactive to light. Right eye exhibits no discharge. Left eye exhibits no discharge. No scleral icterus.  Neck: Neck supple.  Cardiovascular: Normal rate, regular rhythm and normal heart sounds.   Pulmonary/Chest: Effort  normal and breath sounds normal. No respiratory distress. She has no wheezes. She has no rales.  Abdominal: Soft. She exhibits no mass. There is no rebound and no guarding.  Diffuse abdominal tenderness with mild focal tenderness over the epigastrium. No rebound or guarding. Negative Rovsing's, McBurney's. No lesions or deformities. No peritoneal signs or evidence of acute or surgical abdomen. Superficial scars noted to anterior abdomen-patient reports self-inflicted injuries previously.  Musculoskeletal: She exhibits no tenderness.  FROM of R knee. Symmetric with Left. No erythema, warmth or edema.  Neurological: She is alert and oriented to person, place, and time.  Cranial Nerves II-XII grossly intact  Skin: Skin is warm and dry. No rash noted.  Psychiatric: She has a normal mood and affect.  Nursing note and vitals reviewed.   ED Course  Procedures (including critical care time) Labs Review Labs Reviewed  URINALYSIS, ROUTINE W REFLEX MICROSCOPIC - Abnormal; Notable for the following:    Leukocytes, UA SMALL (*)    All other components within normal limits  CBC WITH DIFFERENTIAL/PLATELET - Abnormal; Notable for the following:    Hemoglobin 10.9 (*)    HCT 33.9 (*)    All other components within normal limits  COMPREHENSIVE METABOLIC PANEL - Abnormal; Notable for the following:    Potassium 3.0 (*)    Chloride 95 (*)    Glucose, Bld 128 (*)    Total Bilirubin <0.1 (*)    GFR calc non Af Amer 72 (*)    GFR calc Af Amer 83 (*)    All other components within normal limits  PREGNANCY, URINE  URINE MICROSCOPIC-ADD ON  LIPASE, BLOOD    Imaging Review Dg Abd Acute W/chest  09/14/2014   CLINICAL DATA:  Abdominal pain, nausea and vomiting for 4 days  EXAM: ACUTE ABDOMEN SERIES (ABDOMEN 2 VIEW & CHEST 1 VIEW)  COMPARISON:  08/15/2014  FINDINGS: There is no evidence of dilated bowel loops or free intraperitoneal air. No radiopaque calculi. Moderate gas and stool noted in right colon and  proximal transverse colon. Heart size and mediastinal contours are within normal limits. Both lungs are clear.  IMPRESSION: No acute disease within chest. Normal small bowel gas pattern. Moderate gas and stool noted in right colon and proximal transverse colon.   Electronically Signed   By: Lahoma Crocker M.D.   On: 09/14/2014 13:22     EKG Interpretation   Date/Time:  Thursday September 14 2014 12:47:37 EDT Ventricular Rate:  74 PR Interval:  180 QRS Duration: 92 QT Interval:  418 QTC Calculation: 463 R Axis:   11 Text Interpretation:  Normal sinus rhythm Normal ECG No significant change  since last tracing Confirmed by [facility]  MD, MARTHA [phone]) on 09/14/2014  1:02:41 PM     Meds given in ED:  Medications  sodium chloride 0.9 % bolus 1,000 mL (0 mLs Intravenous Stopped 09/14/14 1354)  gi cocktail (Maalox,Lidocaine,Donnatal) (30 mLs Oral Given 09/14/14 1311)  ondansetron (ZOFRAN) injection 4 mg (4 mg Intravenous Given 09/14/14 1311)  potassium chloride SA (K-DUR,KLOR-CON) CR tablet 40 mEq (40 mEq Oral Given 09/14/14 1504)    Discharge Medication List as of 09/14/2014  2:54 PM    START taking these medications   Details  ondansetron (ZOFRAN ODT) 4 MG disintegrating tablet 4mg  ODT q4 hours prn nausea/vomit, Print    pantoprazole (PROTONIX) 20 MG tablet Take 1 tablet (20 mg total) by mouth daily., Starting 09/14/2014, Until Discontinued, Print    polyethylene glycol powder (GLYCOLAX/MIRALAX) powder Take 17 g by mouth 2 (two) times daily. Until daily soft stools  OTC, Starting 09/14/2014, Until Discontinued, Print    ranitidine (ZANTAC) 150 MG tablet Take 1 tablet (150 mg total) by mouth 2 (two) times daily., Starting 09/14/2014, Until Discontinued, Print       Filed Vitals:   09/14/14 1110 09/14/14 1323 09/14/14 1456  BP: 107/62 115/76 113/61  Pulse: 80 69 70  Temp: 98.7 F (37.1 C) 98.4 F (36.9 C) 98.3 F (36.8 C)  TempSrc: Oral Oral Oral  Resp: 18 18 16   Height: 5\' 4"  (1.626 m)     Weight: 239 lb (108.41 kg)    SpO2: 98% 95% 95%    MDM  Vitals stable, afebrile Feels better with analgesia administered in  ED. Tolerating PO in ED Repeat abd exam benign. No Organomegaly. Moist membranes and no evidence of clinical dehydration.  Diffuse tenderness to L knee--[place] neg. Full ROM with no erythema or warmth or edema. No injury to knee. Discomfort likely secondary to arthritis. Labs:  Pregnancy negative, labs otherwise noncontributory. Given oral potassium in ED for K 3.0 Imaging:Xray Abd/Chest shows no acute cardiopulmonary pathology. Normal bowel gas pattern with some evidence of stool burden  No evidence of Acute Appendicitis, Cholecystitis, Pancreatitis, Pyelonephritis, Cystitis, Diverticulitis, Acute infectious Colitis, SBO. No evidence of GI bleed. Doubt ectopic, torsion. Low suspicion for ischemia or other vascular compromise. Doubt Cardiovascular/Pulmonary etiology No evidence of hemarthrosis, septic joint, vascular compromise.  I have personally reviewed all labs, imaging, nursing/prvious notes during the patient's evaluation in the ED today. No evidence of other acute or emergent pathology that requires immediate intervention at this time. Pt stable, in good condition and is appropriate for discharge. Discussed return precautions. DC with PPI, Zofran, Miralax, Zantac and instructions to follow up with PCP within 48 hrs for further evaluation and management of symptoms.   Final diagnoses:  Constipation, unspecified constipation type  Abdominal discomfort       Comer Locket, PA-C 09/15/14 [place], MD 09/15/14 1504

## 2014-09-14 NOTE — ED Notes (Signed)
Patient transported to X-ray 

## 2014-10-13 ENCOUNTER — Encounter (HOSPITAL_BASED_OUTPATIENT_CLINIC_OR_DEPARTMENT_OTHER): Payer: Self-pay

## 2014-10-13 ENCOUNTER — Emergency Department (HOSPITAL_BASED_OUTPATIENT_CLINIC_OR_DEPARTMENT_OTHER)
Admission: EM | Admit: 2014-10-13 | Discharge: 2014-10-13 | Disposition: A | Discharge status: Home / Self Care | Payer: Self-pay | Attending: Emergency Medicine | Admitting: Emergency Medicine

## 2014-10-13 ENCOUNTER — Emergency Department (HOSPITAL_BASED_OUTPATIENT_CLINIC_OR_DEPARTMENT_OTHER): Payer: Self-pay

## 2014-10-13 DIAGNOSIS — J45909 Unspecified asthma, uncomplicated: Secondary | ICD-10-CM | POA: Insufficient documentation

## 2014-10-13 DIAGNOSIS — Z79899 Other long term (current) drug therapy: Secondary | ICD-10-CM | POA: Insufficient documentation

## 2014-10-13 DIAGNOSIS — Z3202 Encounter for pregnancy test, result negative: Secondary | ICD-10-CM | POA: Insufficient documentation

## 2014-10-13 DIAGNOSIS — M1990 Unspecified osteoarthritis, unspecified site: Secondary | ICD-10-CM | POA: Insufficient documentation

## 2014-10-13 DIAGNOSIS — R404 Transient alteration of awareness: Secondary | ICD-10-CM | POA: Insufficient documentation

## 2014-10-13 DIAGNOSIS — Z8742 Personal history of other diseases of the female genital tract: Secondary | ICD-10-CM | POA: Insufficient documentation

## 2014-10-13 DIAGNOSIS — K219 Gastro-esophageal reflux disease without esophagitis: Secondary | ICD-10-CM | POA: Insufficient documentation

## 2014-10-13 DIAGNOSIS — I10 Essential (primary) hypertension: Secondary | ICD-10-CM | POA: Insufficient documentation

## 2014-10-13 DIAGNOSIS — Z87891 Personal history of nicotine dependence: Secondary | ICD-10-CM | POA: Insufficient documentation

## 2014-10-13 DIAGNOSIS — Z880 Allergy status to penicillin: Secondary | ICD-10-CM | POA: Insufficient documentation

## 2014-10-13 DIAGNOSIS — Z791 Long term (current) use of non-steroidal anti-inflammatories (NSAID): Secondary | ICD-10-CM | POA: Insufficient documentation

## 2014-10-13 LAB — COMPREHENSIVE METABOLIC PANEL
ALT: 16 U/L (ref 0–35)
AST: 18 U/L (ref 0–37)
Albumin: 3.8 g/dL (ref 3.5–5.2)
Alkaline Phosphatase: 73 U/L (ref 39–117)
Anion gap: 6 (ref 5–15)
BUN: 11 mg/dL (ref 6–23)
CO2: 28 mmol/L (ref 19–32)
CREATININE: 0.73 mg/dL (ref 0.50–1.10)
Calcium: 9.1 mg/dL (ref 8.4–10.5)
Chloride: 105 mmol/L (ref 96–112)
GFR calc Af Amer: >90 mL/min (ref >90)
Glucose, Bld: 128 mg/dL — ABNORMAL HIGH (ref 70–99)
POTASSIUM: 3.9 mmol/L (ref 3.5–5.1)
SODIUM: 139 mmol/L (ref 135–145)
TOTAL PROTEIN: 6.9 g/dL (ref 6.0–8.3)
Total Bilirubin: 0.1 mg/dL — ABNORMAL LOW (ref 0.3–1.2)

## 2014-10-13 LAB — ETHANOL: Alcohol, Ethyl (B): <5 mg/dL (ref 0–9)

## 2014-10-13 LAB — RAPID URINE DRUG SCREEN, HOSP PERFORMED
AMPHETAMINES: NOT DETECTED
Barbiturates: NOT DETECTED
Benzodiazepines: NOT DETECTED
COCAINE: NOT DETECTED
OPIATES: NOT DETECTED
Tetrahydrocannabinol: NOT DETECTED

## 2014-10-13 LAB — CBC WITH DIFFERENTIAL/PLATELET
BASOS PCT: 1 % (ref 0–1)
Basophils Absolute: 0 10*3/uL (ref 0.0–0.1)
Eosinophils Absolute: 0.1 10*3/uL (ref 0.0–0.7)
Eosinophils Relative: 1 % (ref 0–5)
HCT: 33.8 % — ABNORMAL LOW (ref 36.0–46.0)
Hemoglobin: 10.7 g/dL — ABNORMAL LOW (ref 12.0–15.0)
LYMPHS ABS: 1.6 10*3/uL (ref 0.7–4.0)
LYMPHS PCT: 26 % (ref 12–46)
MCH: 27.7 pg (ref 26.0–34.0)
MCHC: 31.7 g/dL (ref 30.0–36.0)
MCV: 87.6 fL (ref 78.0–100.0)
MONOS PCT: 9 % (ref 3–12)
Monocytes Absolute: 0.6 10*3/uL (ref 0.1–1.0)
NEUTROS ABS: 3.9 10*3/uL (ref 1.7–7.7)
Neutrophils Relative %: 63 % (ref 43–77)
PLATELETS: 247 10*3/uL (ref 150–400)
RBC: 3.86 MIL/uL — ABNORMAL LOW (ref 3.87–5.11)
RDW: 13.2 % (ref 11.5–15.5)
WBC: 6.1 10*3/uL (ref 4.0–10.5)

## 2014-10-13 LAB — CBG MONITORING, ED: Glucose-Capillary: 125 mg/dL — ABNORMAL HIGH (ref 70–99)

## 2014-10-13 LAB — URINALYSIS, ROUTINE W REFLEX MICROSCOPIC
BILIRUBIN URINE: NEGATIVE
Glucose, UA: NEGATIVE mg/dL
Hgb urine dipstick: NEGATIVE
Ketones, ur: NEGATIVE mg/dL
NITRITE: NEGATIVE
PH: 6.5 (ref 5.0–8.0)
Protein, ur: NEGATIVE mg/dL
Specific Gravity, Urine: 1.017 (ref 1.005–1.030)
Urobilinogen, UA: 0.2 mg/dL (ref 0.0–1.0)

## 2014-10-13 LAB — PREGNANCY, URINE: Preg Test, Ur: NEGATIVE

## 2014-10-13 LAB — AMMONIA: Ammonia: 24 umol/L (ref 11–32)

## 2014-10-13 LAB — TROPONIN I

## 2014-10-13 LAB — PROTIME-INR
INR: 1.01 (ref 0.00–1.49)
PROTHROMBIN TIME: 13.3 s (ref 11.6–15.2)

## 2014-10-13 LAB — URINE MICROSCOPIC-ADD ON

## 2014-10-13 LAB — SALICYLATE LEVEL: Salicylate Lvl: <4 mg/dL (ref 2.8–20.0)

## 2014-10-13 LAB — ACETAMINOPHEN LEVEL

## 2014-10-13 LAB — BRAIN NATRIURETIC PEPTIDE: B Natriuretic Peptide: 68.2 pg/mL (ref 0.0–100.0)

## 2014-10-13 MED ORDER — BLISTEX MEDICATED EX OINT
TOPICAL_OINTMENT | CUTANEOUS | Status: AC
  Administered 2014-10-13: 20:00:00
  Filled 2014-10-13: qty 10

## 2014-10-13 MED ORDER — HYDRALAZINE HCL 25 MG PO TABS: 25.0000 mg | ORAL_TABLET | Freq: Three times a day (TID) | ORAL | Status: DC

## 2014-10-13 MED ORDER — HYDRALAZINE HCL 20 MG/ML IJ SOLN
10.0000 mg | Freq: Once | INTRAMUSCULAR | Status: AC
  Administered 2014-10-13: 10 mg via INTRAVENOUS
  Filled 2014-10-13: qty 1

## 2014-10-13 MED ORDER — SODIUM CHLORIDE 0.9 % IV BOLUS (SEPSIS)
500.0000 mL | Freq: Once | INTRAVENOUS | Status: AC
  Administered 2014-10-13: 500 mL via INTRAVENOUS

## 2014-10-13 MED ORDER — HYDRALAZINE HCL 25 MG PO TABS
25.0000 mg | ORAL_TABLET | Freq: Once | ORAL | Status: DC
  Filled 2014-10-13: qty 1

## 2014-10-13 MED ORDER — HYDRALAZINE HCL 20 MG/ML IJ SOLN: 10.0000 mg | Freq: Once | INTRAMUSCULAR | Status: DC

## 2014-10-13 NOTE — ED Notes (Signed)
Pads applied to both stretcher rails

## 2014-10-13 NOTE — Discharge Instructions (Signed)
Altered Mental Status Altered mental status most often refers to an abnormal change in your responsiveness and awareness. It can affect your speech, thought, mobility, memory, attention span, or alertness. It can range from slight confusion to complete unresponsiveness (coma). Altered mental status can be a sign of a serious underlying medical condition. Rapid evaluation and medical treatment is necessary for patients having an altered mental status. CAUSES   Low blood sugar (hypoglycemia) or diabetes.  Severe loss of body fluids (dehydration) or a body salt (electrolyte) imbalance.  A stroke or other neurologic problem, such as dementia or delirium.  A head injury or tumor.  A drug or alcohol overdose.  Exposure to toxins or poisons.  Depression, anxiety, and stress.  A low oxygen level (hypoxia).  An infection.  Blood loss.  Twitching or shaking (seizure).  Heart problems, such as heart attack or heart rhythm problems (arrhythmias).  A body temperature that is too low or too high (hypothermia or hyperthermia). DIAGNOSIS  A diagnosis is based on your history, symptoms, physical and neurologic examinations, and diagnostic tests. Diagnostic tests may include:  Measurement of your blood pressure, pulse, breathing, and oxygen levels (vital signs).  Blood tests.  Urine tests.  X-ray exams.  A computerized magnetic scan (magnetic resonance imaging, MRI).  A computerized X-ray scan (computed tomography, CT scan). TREATMENT  Treatment will depend on the cause. Treatment may include:  Management of an underlying medical or mental health condition.  Critical care or support in the hospital. [place]   Only take over-the-counter or prescription medicines for pain, discomfort, or fever as directed by your caregiver.  Manage underlying conditions as directed by your caregiver.  Eat a healthy, well-balanced diet to maintain strength.  Join a support group or  prevention program to cope with the condition or trauma that caused the altered mental status. Ask your caregiver to help choose a program that works for you.  Follow up with your caregiver for further examination, therapy, or testing as directed. SEEK MEDICAL CARE IF:   You feel unwell or have chills.  You or your family notice a change in your behavior or your alertness.  You have trouble following your caregiver's treatment plan.  You have questions or concerns. SEEK IMMEDIATE MEDICAL CARE IF:   You have a rapid heartbeat or have chest pain.  You have difficulty breathing.  You have a fever.  You have a headache with a stiff neck.  You cough up blood.  You have blood in your urine or stool.  You have severe agitation or confusion. MAKE SURE YOU:   Understand these instructions.  Will watch your condition.  Will get help right away if you are not doing well or get worse. Document Released: 11/20/2009 Document Revised: 08/25/2011 Document Reviewed: 11/20/2009 [facility] Patient Information 2015 [place], [place]. This information is not intended to replace advice given to you by your health care provider. Make sure you discuss any questions you have with your health care provider. Hypertension Hypertension, commonly called high blood pressure, is when the force of blood pumping through your arteries is too strong. Your arteries are the blood vessels that carry blood from your heart throughout your body. A blood pressure reading consists of a higher number over a lower number, such as 110/72. The higher number (systolic) is the pressure inside your arteries when your heart pumps. The lower number (diastolic) is the pressure inside your arteries when your heart relaxes. Ideally you want your blood pressure below 120/80.  Hypertension forces your heart to work harder to pump blood. Your arteries may become narrow or stiff. Having hypertension puts you at risk for heart disease, stroke,  and other problems.  RISK FACTORS Some risk factors for high blood pressure are controllable. Others are not.  Risk factors you cannot control include:   Race. You may be at higher risk if you are African American.  Age. Risk increases with age.  Gender. Men are at higher risk than women before age 97 years. After age 70, women are at higher risk than men. Risk factors you can control include:  Not getting enough exercise or physical activity.  Being overweight.  Getting too much fat, sugar, calories, or salt in your diet.  Drinking too much alcohol. SIGNS AND SYMPTOMS Hypertension does not usually cause signs or symptoms. Extremely high blood pressure (hypertensive crisis) may cause headache, anxiety, shortness of breath, and nosebleed. DIAGNOSIS  To check if you have hypertension, your health care provider will measure your blood pressure while you are seated, with your arm held at the level of your heart. It should be measured at least twice using the same arm. Certain conditions can cause a difference in blood pressure between your right and left arms. A blood pressure reading that is higher than normal on one occasion does not mean that you need treatment. If one blood pressure reading is high, ask your health care provider about having it checked again. TREATMENT  Treating high blood pressure includes making lifestyle changes and possibly taking medicine. Living a healthy lifestyle can help lower high blood pressure. You may need to change some of your habits. Lifestyle changes may include:  Following the DASH diet. This diet is high in fruits, vegetables, and whole grains. It is low in salt, red meat, and added sugars.  Getting at least 2 hours of brisk physical activity every week.  Losing weight if necessary.  Not smoking.  Limiting alcoholic beverages.  Learning ways to reduce stress. If lifestyle changes are not enough to get your blood pressure under control, your  health care provider may prescribe medicine. You may need to take more than one. Work closely with your health care provider to understand the risks and benefits. HOME CARE INSTRUCTIONS  Have your blood pressure rechecked as directed by your health care provider.   Take medicines only as directed by your health care provider. Follow the directions carefully. Blood pressure medicines must be taken as prescribed. The medicine does not work as well when you skip doses. Skipping doses also puts you at risk for problems.   Do not smoke.   Monitor your blood pressure at home as directed by your health care provider. SEEK MEDICAL CARE IF:   You think you are having a reaction to medicines taken.  You have recurrent headaches or feel dizzy.  You have swelling in your ankles.  You have trouble with your vision. SEEK IMMEDIATE MEDICAL CARE IF:  You develop a severe headache or confusion.  You have unusual weakness, numbness, or feel faint.  You have severe chest or abdominal pain.  You vomit repeatedly.  You have trouble breathing. MAKE SURE YOU:   Understand these instructions.  Will watch your condition.  Will get help right away if you are not doing well or get worse. Document Released: 06/02/2005 Document Revised: 10/17/2013 Document Reviewed: 03/25/2013 [facility] Patient Information 2015 [place], [place]. This information is not intended to replace advice given to you by your health care provider. Make  sure you discuss any questions you have with your health care provider.

## 2014-10-13 NOTE — ED Notes (Signed)
Pt more alert at this time, answering questions appropriately, mae x 4, follows commands w/o hesitation. Cont to c/o HA

## 2014-10-13 NOTE — ED Notes (Addendum)
Patient transported to CT (for head CT w/o contrast per EDP orders)

## 2014-10-13 NOTE — ED Notes (Signed)
Hydralazine po not available at this time, md aware

## 2014-10-13 NOTE — ED Notes (Signed)
Pt is from [facility], has been a resident there since feb. 2016

## 2014-10-13 NOTE — ED Notes (Addendum)
Pt reports [facility] sent her here because her BP was evaluated. Pt states "before lunch started feeling dizzy headed and blurry vision".  Pt keeps bobbing head and wont keep head still.  Pt slow to answer questions. Pt in [facility] for alcohol and depression.

## 2014-10-13 NOTE — ED Notes (Signed)
Notified [facility] of patients discharge and need for transportation back to facility

## 2014-10-13 NOTE — ED Notes (Signed)
FAST assessment: negative 

## 2014-10-13 NOTE — ED Notes (Signed)
Pt able to stand to use bedside commode w/ min assistance, pt very alert and oriented, able to follow commands w/o hesitation or difficulty

## 2014-10-13 NOTE — ED Notes (Signed)
Pt tells EDP that she has had blurred vision and a headache today.

## 2014-10-13 NOTE — ED Provider Notes (Signed)
CSN: [id number]     Arrival date & time 10/13/14  1628 History   First MD Initiated Contact with Patient 10/13/14 1658     Chief Complaint  Patient presents with  . Altered Mental Status     (Consider location/radiation/quality/duration/timing/severity/associated sxs/prior Treatment) HPI The patient is coming over from [facility] for elevated blood pressures. She reports that she used to take an evening medication for her blood pressure which got stopped about a month ago. She reports her blood pressures now have been frequently running high in the evenings. She states she's been having symptoms of frontal headaches aching in nature that come and go sometimes is associated blurred vision, generalized abdominal pain without vomiting or diarrhea and muscle aches. She denies fever in association with this.  Past Medical History  Diagnosis Date  . High cholesterol   . Depression   . Gout   . Anxiety   . Bipolar 1 disorder   . GERD (gastroesophageal reflux disease)   . Substance abuse     crack cocaine  . Headache(784.0)     migraines  . TMJ (temporomandibular joint disorder)   . Asthma     daily and prn inhalers  . Arthritis     back and knees  . Gastric ulcer   . 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 Hypertension     under control, has been on med. x 12 yrs.  . Diabetes mellitus     diet-controlled   Past Surgical History  Procedure Laterality Date  . Knee arthroscopy w/ partial medial meniscectomy  05/01/2010    right  . Breast lumpectomy with needle localization  04/19/2012    Procedure: BREAST LUMPECTOMY WITH NEEDLE LOCALIZATION;  Surgeon: [name], MD;  Location: [facility];  Service: General;  Laterality: Right;   Family History  Problem Relation Age of Onset  . Diabetes Mother   . Cancer Mother     breast  . Cancer Father    History  Substance Use Topics  . Smoking status: Former Smoker -- 15 years    Quit date:  08/02/2012  . Smokeless tobacco: Never Used     Comment: quit smoking 02/2012  . Alcohol Use: No   OB History    No data available     Review of Systems  10 Systems reviewed and are negative for acute change except as noted in the HPI.   Allergies  Chocolate; Orange; Penicillins; and Tomato  Home Medications   Prior to Admission medications   Medication Sig Start Date End Date Taking? Authorizing Provider  albuterol (PROVENTIL HFA;VENTOLIN HFA) 108 (90 BASE) MCG/ACT inhaler Inhale 2 puffs into the lungs every 6 (six) hours as needed for wheezing or shortness of breath. 03/02/14   Encarnacion Slates, NP  atenolol (TENORMIN) 100 MG tablet Take 1 tablet (100 mg total) by mouth daily. For hypertension 03/02/14   Encarnacion Slates, NP  celecoxib (CELEBREX) 100 MG capsule Take 1 capsule (100 mg total) by mouth 2 (two) times daily. For pain/arthritis 08/02/14   Shuvon B Rankin, NP  citalopram (CELEXA) 20 MG tablet Take 1 tablet (20 mg total) by mouth daily. For depression 08/02/14   Shuvon B Rankin, NP  Gabapentin (NEURONTIN PO) Take by mouth.    Historical Provider, MD  hydrALAZINE (APRESOLINE) 25 MG tablet Take 1 tablet (25 mg total) by mouth 3 (three) times daily. 10/13/14   Charlesetta Shanks, MD  hydrochlorothiazide (  HYDRODIURIL) 25 MG tablet Take 1 tablet (25 mg total) by mouth daily. For hypertension 03/02/14   Encarnacion Slates, NP  hydrOXYzine (ATARAX/VISTARIL) 25 MG tablet Take 1 tablet (25 mg total) by mouth every 6 (six) hours as needed for anxiety. 08/02/14   Shuvon B Rankin, NP  loratadine (CLARITIN) 10 MG tablet Take 1 tablet (10 mg total) by mouth daily. (May be purchased from over the counter): For allergies Patient not taking: Reported on 07/02/2014 03/02/14   Encarnacion Slates, NP  metFORMIN (GLUCOPHAGE) 500 MG tablet Take 1 tablet (500 mg total) by mouth every evening. For Type 2 Diabetes 08/02/14   Shuvon B Rankin, NP  nicotine (NICODERM CQ - DOSED IN MG/24 HOURS) 21 mg/24hr patch Place 1 patch (21 mg  total) onto the skin daily. For smoking cessation 08/02/14   Shuvon B Rankin, NP  omeprazole (PRILOSEC) 20 MG capsule Take 1 capsule (20 mg total) by mouth daily. For GERD 08/02/14   Shuvon B Rankin, NP  ondansetron (ZOFRAN ODT) 4 MG disintegrating tablet 4mg  ODT q4 hours prn nausea/vomit 09/14/14   Comer Locket, PA-C  oxcarbazepine (TRILEPTAL) 600 MG tablet Take 1 tablet (600 mg total) by mouth 2 (two) times daily. For mood stabilization 08/02/14   Shuvon B Rankin, NP  pantoprazole (PROTONIX) 20 MG tablet Take 1 tablet (20 mg total) by mouth daily. 09/14/14   Comer Locket, PA-C  polyethylene glycol powder (GLYCOLAX/MIRALAX) powder Take 17 g by mouth 2 (two) times daily. Until daily soft stools  OTC 09/14/14   Comer Locket, PA-C  QUEtiapine (SEROQUEL XR) 300 MG 24 hr tablet Take 2 tablets (600 mg total) by mouth daily at 8 pm. For depression and mood stabilization 08/02/14   Shuvon B Rankin, NP  ranitidine (ZANTAC) 150 MG tablet Take 1 tablet (150 mg total) by mouth 2 (two) times daily. 09/14/14   Benjamin Cartner, PA-C   BP 162/79 mmHg  Pulse 78  Temp(Src) 98.5 F (36.9 C) (Oral)  Resp 18  SpO2 100%  LMP  (Approximate) Physical Exam  Constitutional:  Upon being brought back from triage the patient is somewhat somnolent and difficult to arouse. She intermittently answers questions appropriately and is in no respiratory distress.  HENT:  Head: Normocephalic and atraumatic.  Right Ear: External ear normal.  Left Ear: External ear normal.  Nose: Nose normal.  Mouth/Throat: Oropharynx is clear and moist.  Eyes: EOM are normal. Pupils are equal, round, and reactive to light. Right eye exhibits no discharge. Left eye exhibits no discharge.  Neck: Neck supple.  Cardiovascular: Normal rate, regular rhythm, normal heart sounds and intact distal pulses.   Pulmonary/Chest: Effort normal and breath sounds normal.  Abdominal: Soft. Bowel sounds are normal. She exhibits no distension. There is  no tenderness.  Musculoskeletal: Normal range of motion. She exhibits no edema.  Neurological: She has normal strength. GCS eye subscore is 4. GCS verbal subscore is 5. GCS motor subscore is 6.  At the time of arrival the patient was somewhat somnolent and intermittent in her responses to questions. When she responded the speech had normal content and was not slurred. She was able to follow commands for grips and movement of her feet. A more detailed examination is done after her symptoms have resolved with the patient having normal cognitive function normal cranial nerve examination 5 out of 5 upper and lower extremity strength.  Skin: Skin is warm, dry and intact.  Psychiatric: She has a normal mood and affect.  ED Course  Procedures (including critical care time) Labs Review Labs Reviewed  COMPREHENSIVE METABOLIC PANEL - Abnormal; Notable for the following:    Glucose, Bld 128 (*)    Total Bilirubin 0.1 (*)    All other components within normal limits  ACETAMINOPHEN LEVEL - Abnormal; Notable for the following:    Acetaminophen (Tylenol), Serum <10.0 (*)    All other components within normal limits  CBC WITH DIFFERENTIAL/PLATELET - Abnormal; Notable for the following:    RBC 3.86 (*)    Hemoglobin 10.7 (*)    HCT 33.8 (*)    All other components within normal limits  URINALYSIS, ROUTINE W REFLEX MICROSCOPIC - Abnormal; Notable for the following:    Leukocytes, UA MODERATE (*)    All other components within normal limits  CBG MONITORING, ED - Abnormal; Notable for the following:    Glucose-Capillary 125 (*)    All other components within normal limits  ETHANOL  SALICYLATE LEVEL  TROPONIN I  PROTIME-INR  PREGNANCY, URINE  URINE RAPID DRUG SCREEN (HOSP PERFORMED)  AMMONIA  URINE MICROSCOPIC-ADD ON  BRAIN NATRIURETIC PEPTIDE    Imaging Review Ct Head Wo Contrast  10/13/2014   CLINICAL DATA:  Headache with dizziness and nausea today  EXAM: CT HEAD WITHOUT CONTRAST   TECHNIQUE: Contiguous axial images were obtained from the base of the skull through the vertex without intravenous contrast.  COMPARISON:  Head CT 10/20/2005  FINDINGS: No acute intracranial hemorrhage. No focal mass lesion. No CT evidence of acute infarction. No midline shift or mass effect. No hydrocephalus. Basilar cisterns are patent. Paranasal sinuses and mastoid air cells are clear.  IMPRESSION: Normal head CT.   Electronically Signed   By: Suzy Bouchard M.D.   On: 10/13/2014 17:34   Dg Chest Port 1 View  10/13/2014   CLINICAL DATA:  Altered mental status.  Dizziness and hypertension.  EXAM: PORTABLE CHEST - 1 VIEW  COMPARISON:  09/14/2014  FINDINGS: Shallow inflation. The heart is enlarged. There are diffuse symmetric infiltrates bilaterally likely representing pulmonary edema. No definite pleural effusions.  IMPRESSION: Cardiomegaly and bilateral infiltrates likely representing pulmonary edema.   Electronically Signed   By: Nolon Nations M.D.   On: 10/13/2014 18:12     EKG Interpretation None      MDM   Final diagnoses:  Essential hypertension  Transient alteration of awareness    The patient's blood pressures had intermittently normalized without any treatment. There were a number pressures from the 425Z systolic to 563O systolic over 75I to 43P. Repeat pressures around the time of discharge were somewhat increased. Mental status status remained clear. The patient will be restarted on hydralazine. She reported having been on an evening medication within the past few weeks to months and that she was no longer getting it. Reviewing the EMR shows her to been on hydralazine which will be reinstituted.   Charlesetta Shanks, MD 10/13/14 2103

## 2014-10-13 NOTE — ED Notes (Signed)
Pt on bedpan to obtain urine specimen as ordered

## 2014-10-13 NOTE — ED Notes (Addendum)
Pt here for evaluation of hypertension from [facility].  Ambulatory to ED triage and was talking then had a change in mentation.

## 2014-11-03 DIAGNOSIS — I6302 Cerebral infarction due to thrombosis of basilar artery: Secondary | ICD-10-CM | POA: Insufficient documentation

## 2014-11-04 DIAGNOSIS — I679 Cerebrovascular disease, unspecified: Secondary | ICD-10-CM | POA: Insufficient documentation

## 2014-11-04 DIAGNOSIS — E782 Mixed hyperlipidemia: Secondary | ICD-10-CM | POA: Insufficient documentation

## 2014-11-04 DIAGNOSIS — E1142 Type 2 diabetes mellitus with diabetic polyneuropathy: Secondary | ICD-10-CM | POA: Insufficient documentation

## 2014-11-08 DIAGNOSIS — M1711 Unilateral primary osteoarthritis, right knee: Secondary | ICD-10-CM | POA: Insufficient documentation

## 2014-11-12 DIAGNOSIS — M109 Gout, unspecified: Secondary | ICD-10-CM | POA: Insufficient documentation

## 2014-11-23 ENCOUNTER — Emergency Department (HOSPITAL_COMMUNITY): Payer: Self-pay

## 2014-11-23 ENCOUNTER — Emergency Department (HOSPITAL_COMMUNITY)
Admission: EM | Admit: 2014-11-23 | Discharge: 2014-11-23 | Disposition: A | Discharge status: Home / Self Care | Payer: Self-pay

## 2014-11-23 ENCOUNTER — Encounter (HOSPITAL_COMMUNITY): Payer: Self-pay | Admitting: Emergency Medicine

## 2014-11-23 DIAGNOSIS — E119 Type 2 diabetes mellitus without complications: Secondary | ICD-10-CM | POA: Insufficient documentation

## 2014-11-23 DIAGNOSIS — K219 Gastro-esophageal reflux disease without esophagitis: Secondary | ICD-10-CM | POA: Insufficient documentation

## 2014-11-23 DIAGNOSIS — Z79899 Other long term (current) drug therapy: Secondary | ICD-10-CM | POA: Insufficient documentation

## 2014-11-23 DIAGNOSIS — I10 Essential (primary) hypertension: Secondary | ICD-10-CM | POA: Insufficient documentation

## 2014-11-23 DIAGNOSIS — R112 Nausea with vomiting, unspecified: Secondary | ICD-10-CM | POA: Insufficient documentation

## 2014-11-23 DIAGNOSIS — R109 Unspecified abdominal pain: Secondary | ICD-10-CM

## 2014-11-23 DIAGNOSIS — Z87891 Personal history of nicotine dependence: Secondary | ICD-10-CM | POA: Insufficient documentation

## 2014-11-23 DIAGNOSIS — M1990 Unspecified osteoarthritis, unspecified site: Secondary | ICD-10-CM | POA: Insufficient documentation

## 2014-11-23 DIAGNOSIS — G43909 Migraine, unspecified, not intractable, without status migrainosus: Secondary | ICD-10-CM | POA: Insufficient documentation

## 2014-11-23 DIAGNOSIS — F419 Anxiety disorder, unspecified: Secondary | ICD-10-CM | POA: Insufficient documentation

## 2014-11-23 DIAGNOSIS — F319 Bipolar disorder, unspecified: Secondary | ICD-10-CM | POA: Insufficient documentation

## 2014-11-23 DIAGNOSIS — R197 Diarrhea, unspecified: Secondary | ICD-10-CM | POA: Insufficient documentation

## 2014-11-23 DIAGNOSIS — E876 Hypokalemia: Secondary | ICD-10-CM | POA: Insufficient documentation

## 2014-11-23 DIAGNOSIS — Z880 Allergy status to penicillin: Secondary | ICD-10-CM | POA: Insufficient documentation

## 2014-11-23 DIAGNOSIS — N390 Urinary tract infection, site not specified: Secondary | ICD-10-CM | POA: Insufficient documentation

## 2014-11-23 DIAGNOSIS — J45909 Unspecified asthma, uncomplicated: Secondary | ICD-10-CM | POA: Insufficient documentation

## 2014-11-23 LAB — COMPREHENSIVE METABOLIC PANEL
ALK PHOS: 78 U/L (ref 38–126)
ALT: 21 U/L (ref 14–54)
AST: 29 U/L (ref 15–41)
Albumin: 4.5 g/dL (ref 3.5–5.0)
Anion gap: 18 — ABNORMAL HIGH (ref 5–15)
BUN: 16 mg/dL (ref 6–20)
CO2: 28 mmol/L (ref 22–32)
CREATININE: 1.02 mg/dL — AB (ref 0.44–1.00)
Calcium: 10.1 mg/dL (ref 8.9–10.3)
Chloride: 95 mmol/L — ABNORMAL LOW (ref 101–111)
GFR calc Af Amer: >60 mL/min (ref >60)
GFR calc non Af Amer: >60 mL/min (ref >60)
GLUCOSE: 165 mg/dL — AB (ref 65–99)
POTASSIUM: 2.7 mmol/L — AB (ref 3.5–5.1)
SODIUM: 141 mmol/L (ref 135–145)
Total Bilirubin: 0.7 mg/dL (ref 0.3–1.2)
Total Protein: 8.5 g/dL — ABNORMAL HIGH (ref 6.5–8.1)

## 2014-11-23 LAB — URINALYSIS, ROUTINE W REFLEX MICROSCOPIC
BILIRUBIN URINE: NEGATIVE
GLUCOSE, UA: NEGATIVE mg/dL
HGB URINE DIPSTICK: NEGATIVE
KETONES UR: NEGATIVE mg/dL
NITRITE: NEGATIVE
Protein, ur: NEGATIVE mg/dL
Specific Gravity, Urine: 1.01 (ref 1.005–1.030)
UROBILINOGEN UA: 0.2 mg/dL (ref 0.0–1.0)
pH: 6 (ref 5.0–8.0)

## 2014-11-23 LAB — I-STAT TROPONIN, ED: TROPONIN I, POC: 0 ng/mL (ref 0.00–0.08)

## 2014-11-23 LAB — CBC WITH DIFFERENTIAL/PLATELET
BASOS PCT: 0 % (ref 0–1)
Basophils Absolute: 0 10*3/uL (ref 0.0–0.1)
Eosinophils Absolute: 0 10*3/uL (ref 0.0–0.7)
Eosinophils Relative: 1 % (ref 0–5)
HCT: 38 % (ref 36.0–46.0)
HEMOGLOBIN: 12.2 g/dL (ref 12.0–15.0)
LYMPHS PCT: 16 % (ref 12–46)
Lymphs Abs: 1.1 10*3/uL (ref 0.7–4.0)
MCH: 27.4 pg (ref 26.0–34.0)
MCHC: 32.1 g/dL (ref 30.0–36.0)
MCV: 85.2 fL (ref 78.0–100.0)
Monocytes Absolute: 0.4 10*3/uL (ref 0.1–1.0)
Monocytes Relative: 5 % (ref 3–12)
NEUTROS PCT: 78 % — AB (ref 43–77)
Neutro Abs: 5.5 10*3/uL (ref 1.7–7.7)
PLATELETS: 254 10*3/uL (ref 150–400)
RBC: 4.46 MIL/uL (ref 3.87–5.11)
RDW: 12.8 % (ref 11.5–15.5)
WBC: 7 10*3/uL (ref 4.0–10.5)

## 2014-11-23 LAB — LIPASE, BLOOD: Lipase: 36 U/L (ref 22–51)

## 2014-11-23 LAB — URINE MICROSCOPIC-ADD ON

## 2014-11-23 MED ORDER — HYDROCODONE-ACETAMINOPHEN 5-325 MG PO TABS: 1.0000 | ORAL_TABLET | Freq: Four times a day (QID) | ORAL | Status: DC | PRN

## 2014-11-23 MED ORDER — SODIUM CHLORIDE 0.9 % IV BOLUS (SEPSIS)
1000.0000 mL | Freq: Once | INTRAVENOUS | Status: AC
  Administered 2014-11-23: 1000 mL via INTRAVENOUS

## 2014-11-23 MED ORDER — CIPROFLOXACIN HCL 500 MG PO TABS: 500.0000 mg | ORAL_TABLET | Freq: Two times a day (BID) | ORAL | Status: DC

## 2014-11-23 MED ORDER — HYDROMORPHONE HCL 1 MG/ML IJ SOLN
1.0000 mg | Freq: Once | INTRAMUSCULAR | Status: AC
  Administered 2014-11-23: 1 mg via INTRAVENOUS
  Filled 2014-11-23: qty 1

## 2014-11-23 MED ORDER — IOHEXOL 300 MG/ML  SOLN
50.0000 mL | Freq: Once | INTRAMUSCULAR | Status: AC | PRN
  Administered 2014-11-23: 50 mL via ORAL

## 2014-11-23 MED ORDER — POTASSIUM CHLORIDE CRYS ER 20 MEQ PO TBCR: 20.0000 meq | EXTENDED_RELEASE_TABLET | Freq: Every day | ORAL | Status: DC

## 2014-11-23 MED ORDER — CIPROFLOXACIN IN D5W 400 MG/200ML IV SOLN
400.0000 mg | Freq: Once | INTRAVENOUS | Status: AC
  Administered 2014-11-23: 400 mg via INTRAVENOUS
  Filled 2014-11-23: qty 200

## 2014-11-23 MED ORDER — POTASSIUM CHLORIDE 10 MEQ/100ML IV SOLN
10.0000 meq | INTRAVENOUS | Status: AC
  Administered 2014-11-23 (×3): 10 meq via INTRAVENOUS
  Filled 2014-11-23 (×4): qty 100

## 2014-11-23 MED ORDER — MAGIC MOUTHWASH W/LIDOCAINE: 10.0000 mL | Freq: Four times a day (QID) | ORAL | Status: DC | PRN

## 2014-11-23 MED ORDER — MORPHINE SULFATE 4 MG/ML IJ SOLN
4.0000 mg | Freq: Once | INTRAMUSCULAR | Status: AC
  Administered 2014-11-23: 4 mg via INTRAVENOUS
  Filled 2014-11-23: qty 1

## 2014-11-23 MED ORDER — IOHEXOL 300 MG/ML  SOLN
100.0000 mL | Freq: Once | INTRAMUSCULAR | Status: AC | PRN
  Administered 2014-11-23: 100 mL via INTRAVENOUS

## 2014-11-23 MED ORDER — ONDANSETRON HCL 4 MG/2ML IJ SOLN
4.0000 mg | Freq: Once | INTRAMUSCULAR | Status: AC
  Administered 2014-11-23: 4 mg via INTRAVENOUS
  Filled 2014-11-23: qty 2

## 2014-11-23 MED ORDER — POTASSIUM CHLORIDE CRYS ER 20 MEQ PO TBCR
40.0000 meq | EXTENDED_RELEASE_TABLET | Freq: Once | ORAL | Status: AC
  Administered 2014-11-23: 40 meq via ORAL
  Filled 2014-11-23: qty 2

## 2014-11-23 MED ORDER — ONDANSETRON 4 MG PO TBDP: ORAL_TABLET | ORAL | Status: DC

## 2014-11-23 NOTE — Discharge Instructions (Signed)
Take motrin for pain.  Take zofran for nausea.  Take norco for severe pain. Do NOT drive with it.   Take potassium pills daily for 3 days.   Stay hydrated.   Take cipro twice daily for a week.   Recheck potassium in a week. See your doctor in a week.   Return to ED if you have severe pain, vomiting, dehydration.   Potassium Content of Foods Potassium is a mineral found in many foods and drinks. It helps keep fluids and minerals balanced in your body and affects how steadily your heart beats. Potassium also helps control your blood pressure and keep your muscles and nervous system healthy. Certain health conditions and medicines may change the balance of potassium in your body. When this happens, you can help balance your level of potassium through the foods that you do or do not eat. Your health care provider or dietitian may recommend an amount of potassium that you should have each day. The following lists of foods provide the amount of potassium (in parentheses) per serving in each item. HIGH IN POTASSIUM  The following foods and beverages have 200 mg or more of potassium per serving:  Apricots, 2 raw or 5 dry (200 mg).  Artichoke, 1 medium (345 mg).  Avocado, raw,  each (245 mg).  Banana, 1 medium (425 mg).  Beans, lima, or baked beans, canned,  cup (280 mg).  Beans, white, canned,  cup (595 mg).  Beef roast, 3 oz (320 mg).  Beef, ground, 3 oz (270 mg).  Beets, raw or cooked,  cup (260 mg).  Bran muffin, 2 oz (300 mg).  Broccoli,  cup (230 mg).  Brussels sprouts,  cup (250 mg).  Cantaloupe,  cup (215 mg).  Cereal, 100% bran,  cup (200-400 mg).  Cheeseburger, single, fast food, 1 each (225-400 mg).  Chicken, 3 oz (220 mg).  Clams, canned, 3 oz (535 mg).  Crab, 3 oz (225 mg).  Dates, 5 each (270 mg).  Dried beans and peas,  cup (300-475 mg).  Figs, dried, 2 each (260 mg).  Fish: halibut, tuna, cod, snapper, 3 oz (480 mg).  Fish: salmon,  haddock, swordfish, perch, 3 oz (300 mg).  Fish, tuna, canned 3 oz (200 mg).  [place] fries, fast food, 3 oz (470 mg).  Granola with fruit and nuts,  cup (200 mg).  Grapefruit juice,  cup (200 mg).  Greens, beet,  cup (655 mg).  Honeydew melon,  cup (200 mg).  Kale, raw, 1 cup (300 mg).  Kiwi, 1 medium (240 mg).  Kohlrabi, rutabaga, parsnips,  cup (280 mg).  Lentils,  cup (365 mg).  Mango, 1 each (325 mg).  Milk, chocolate, 1 cup (420 mg).  Milk: nonfat, low-fat, whole, buttermilk, 1 cup (350-380 mg).  Molasses, 1 Tbsp (295 mg).  Mushrooms,  cup (280) mg.  Nectarine, 1 each (275 mg).  Nuts: almonds, peanuts, hazelnuts, [place], cashew, mixed, 1 oz (200 mg).  Nuts, pistachios, 1 oz (295 mg).  Orange, 1 each (240 mg).  Orange juice,  cup (235 mg).  Papaya, medium,  fruit (390 mg).  Peanut butter, chunky, 2 Tbsp (240 mg).  Peanut butter, smooth, 2 Tbsp (210 mg).  Pear, 1 medium (200 mg).  Pomegranate, 1 whole (400 mg).  Pomegranate juice,  cup (215 mg).  Pork, 3 oz (350 mg).  Potato chips, salted, 1 oz (465 mg).  Potato, baked with skin, 1 medium (925 mg).  Potatoes, boiled,  cup (255 mg).  Potatoes,  mashed,  cup (330 mg).  Prune juice,  cup (370 mg).  Prunes, 5 each (305 mg).  Pudding, chocolate,  cup (230 mg).  Pumpkin, canned,  cup (250 mg).  Raisins, seedless,  cup (270 mg).  Seeds, sunflower or pumpkin, 1 oz (240 mg).  Soy milk, 1 cup (300 mg).  Spinach,  cup (420 mg).  Spinach, canned,  cup (370 mg).  Sweet potato, baked with skin, 1 medium (450 mg).  Swiss chard,  cup (480 mg).  Tomato or vegetable juice,  cup (275 mg).  Tomato sauce or puree,  cup (400-550 mg).  Tomato, raw, 1 medium (290 mg).  Tomatoes, canned,  cup (200-300 mg).  [place], 3 oz (250 mg).  Wheat germ, 1 oz (250 mg).  Winter squash,  cup (250 mg).  Yogurt, plain or fruited, 6 oz (260-435 mg).  Zucchini,  cup (220  mg). MODERATE IN POTASSIUM The following foods and beverages have 50-200 mg of potassium per serving:  Apple, 1 each (150 mg).  Apple juice,  cup (150 mg).  Applesauce,  cup (90 mg).  Apricot nectar,  cup (140 mg).  Asparagus, small spears,  cup or 6 spears (155 mg).  Bagel, cinnamon raisin, 1 each (130 mg).  Bagel, egg or plain, 4 in., 1 each (70 mg).  Beans, green,  cup (90 mg).  Beans, yellow,  cup (190 mg).  Beer, regular, 12 oz (100 mg).  Beets, canned,  cup (125 mg).  Blackberries,  cup (115 mg).  Blueberries,  cup (60 mg).  Bread, whole wheat, 1 slice (70 mg).  Broccoli, raw,  cup (145 mg).  Cabbage,  cup (150 mg).  Carrots, cooked or raw,  cup (180 mg).  Cauliflower, raw,  cup (150 mg).  Celery, raw,  cup (155 mg).  Cereal, bran flakes, cup (120-150 mg).  Cheese, cottage,  cup (110 mg).  Cherries, 10 each (150 mg).  Chocolate, 1 oz bar (165 mg).  Coffee, brewed 6 oz (90 mg).  Corn,  cup or 1 ear (195 mg).  Cucumbers,  cup (80 mg).  Egg, large, 1 each (60 mg).  Eggplant,  cup (60 mg).  Endive, raw, cup (80 mg).  English muffin, 1 each (65 mg).  Fish, orange roughy, 3 oz (150 mg).  Frankfurter, beef or pork, 1 each (75 mg).  Fruit cocktail,  cup (115 mg).  Grape juice,  cup (170 mg).  Grapefruit,  fruit (175 mg).  Grapes,  cup (155 mg).  Greens: kale, turnip, collard,  cup (110-150 mg).  Ice cream or frozen yogurt, chocolate,  cup (175 mg).  Ice cream or frozen yogurt, vanilla,  cup (120-150 mg).  Lemons, limes, 1 each (80 mg).  Lettuce, all types, 1 cup (100 mg).  Mixed vegetables,  cup (150 mg).  Mushrooms, raw,  cup (110 mg).  Nuts: walnuts, pecans, or macadamia, 1 oz (125 mg).  Oatmeal,  cup (80 mg).  Okra,  cup (110 mg).  Onions, raw,  cup (120 mg).  Peach, 1 each (185 mg).  Peaches, canned,  cup (120 mg).  Pears, canned,  cup (120 mg).  Peas, green, frozen,  cup (90  mg).  Peppers, green,  cup (130 mg).  Peppers, red,  cup (160 mg).  Pineapple juice,  cup (165 mg).  Pineapple, fresh or canned,  cup (100 mg).  Plums, 1 each (105 mg).  Pudding, vanilla,  cup (150 mg).  Raspberries,  cup (90 mg).  Rhubarb,  cup (115 mg).  Rice, wild,  cup (80 mg).  Shrimp, 3 oz (155 mg).  Spinach, raw, 1 cup (170 mg).  Strawberries,  cup (125 mg).  Summer squash  cup (175-200 mg).  Swiss chard, raw, 1 cup (135 mg).  Tangerines, 1 each (140 mg).  Tea, brewed, 6 oz (65 mg).  Turnips,  cup (140 mg).  Watermelon,  cup (85 mg).  Wine, red, table, 5 oz (180 mg).  Wine, white, table, 5 oz (100 mg). LOW IN POTASSIUM The following foods and beverages have less than 50 mg of potassium per serving.  Bread, white, 1 slice (30 mg).  Carbonated beverages, 12 oz (less than 5 mg).  Cheese, 1 oz (20-30 mg).  Cranberries,  cup (45 mg).  Cranberry juice cocktail,  cup (20 mg).  Fats and oils, 1 Tbsp (less than 5 mg).  Hummus, 1 Tbsp (32 mg).  Nectar: papaya, mango, or pear,  cup (35 mg).  Rice, white or brown,  cup (50 mg).  Spaghetti or macaroni,  cup cooked (30 mg).  Tortilla, flour or corn, 1 each (50 mg).  Waffle, 4 in., 1 each (50 mg).  Water chestnuts,  cup (40 mg). Document Released: 01/14/2005 Document Revised: 06/07/2013 Document Reviewed: 04/29/2013 [facility] Patient Information 2015 [name], [place]. This information is not intended to replace advice given to you by your health care provider. Make sure you discuss any questions you have with your health care provider.

## 2014-11-23 NOTE — ED Notes (Signed)
Nurse currently starting IV 

## 2014-11-23 NOTE — ED Notes (Signed)
Pt reports burning at IV site, no swelling or redness noted, K+ slowed to 6ml/hr. Cryotherapy applied to RUE. Duration of stay explained to family and patient

## 2014-11-23 NOTE — ED Provider Notes (Signed)
CSN: [id number]     Arrival date & time 11/23/14  1131 History   First MD Initiated Contact with Patient 11/23/14 1145     Chief Complaint  Patient presents with  . Abdominal Pain  . Nausea     (Consider location/radiation/quality/duration/timing/severity/associated sxs/prior Treatment) The history is provided by the patient.  Maureen Swanson is a 49 y.o. female history of bipolar, reflux, depression, hypertension here presenting with vomiting, abdominal pain, nausea. Symptoms for the last week or so. Has been having diffuse abdominal pain for the last week or so. She has been trying to vomit but only some phlegm comes up. Has some pain radiating up to her chest for several days as well. Denies any recent travel or bad food. Denies any sick contacts.   Past Medical History  Diagnosis Date  . High cholesterol   . Depression   . Gout   . Anxiety   . Bipolar 1 disorder   . GERD (gastroesophageal reflux disease)   . Substance abuse     crack cocaine  . Headache(784.0)     migraines  . TMJ (temporomandibular joint disorder)   . Asthma     daily and prn inhalers  . Arthritis     back and knees  . Gastric ulcer   . 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 Hypertension     under control, has been on med. x 12 yrs.  . Diabetes mellitus     diet-controlled   Past Surgical History  Procedure Laterality Date  . Knee arthroscopy w/ partial medial meniscectomy  05/01/2010    right  . Breast lumpectomy with needle localization  04/19/2012    Procedure: BREAST LUMPECTOMY WITH NEEDLE LOCALIZATION;  Surgeon: [name], MD;  Location: [facility];  Service: General;  Laterality: Right;   Family History  Problem Relation Age of Onset  . Diabetes Mother   . Cancer Mother     breast  . Cancer Father    History  Substance Use Topics  . Smoking status: Former Smoker -- 15 years    Quit date: 08/02/2012  . Smokeless tobacco: Never Used     Comment:  quit smoking 02/2012  . Alcohol Use: No   OB History    No data available     Review of Systems  Gastrointestinal: Positive for nausea, vomiting, abdominal pain and diarrhea.  All other systems reviewed and are negative.     Allergies  Chocolate; Orange; Penicillins; and Tomato  Home Medications   Prior to Admission medications   Medication Sig Start Date End Date Taking? Authorizing Provider  albuterol (PROVENTIL HFA;VENTOLIN HFA) 108 (90 BASE) MCG/ACT inhaler Inhale 2 puffs into the lungs every 6 (six) hours as needed for wheezing or shortness of breath. 03/02/14  Yes Encarnacion Slates, NP  amLODipine (NORVASC) 5 MG tablet Take 5 mg by mouth daily.   Yes Historical Provider, MD  atenolol (TENORMIN) 100 MG tablet Take 1 tablet (100 mg total) by mouth daily. For hypertension 03/02/14  Yes Encarnacion Slates, NP  celecoxib (CELEBREX) 100 MG capsule Take 1 capsule (100 mg total) by mouth 2 (two) times daily. For pain/arthritis 08/02/14  Yes Shuvon B Rankin, NP  cetirizine (ZYRTEC) 10 MG tablet Take 10 mg by mouth daily.   Yes Historical Provider, MD  Dexlansoprazole (DEXILANT) 30 MG capsule Take 30 mg by mouth daily.   Yes Historical Provider, MD  furosemide (LASIX) 40  MG tablet Take 40 mg by mouth daily.   Yes Historical Provider, MD  glipiZIDE (GLUCOTROL) 10 MG tablet Take 10 mg by mouth daily before breakfast.   Yes Historical Provider, MD  hydrochlorothiazide (HYDRODIURIL) 25 MG tablet Take 1 tablet (25 mg total) by mouth daily. For hypertension 03/02/14  Yes Encarnacion Slates, NP  hydrOXYzine (VISTARIL) 25 MG capsule Take 25 mg by mouth every 6 (six) hours as needed for anxiety.   Yes Historical Provider, MD  metFORMIN (GLUCOPHAGE) 1000 MG tablet Take 1,000 mg by mouth 2 (two) times daily with a meal.   Yes Historical Provider, MD  naltrexone (DEPADE) 50 MG tablet Take 50 mg by mouth daily.   Yes Historical Provider, MD  omeprazole (PRILOSEC) 40 MG capsule Take 40 mg by mouth daily.   Yes  Historical Provider, MD  oxcarbazepine (TRILEPTAL) 600 MG tablet Take 1 tablet (600 mg total) by mouth 2 (two) times daily. For mood stabilization Patient taking differently: Take 600 mg by mouth every evening. For mood stabilization 08/02/14  Yes Shuvon B Rankin, NP  Oyster Shell Calcium (OYSCO 500) 500 MG TABS Take 1 tablet by mouth daily.   Yes Historical Provider, MD  QUEtiapine (SEROQUEL XR) 300 MG 24 hr tablet Take 2 tablets (600 mg total) by mouth daily at 8 pm. For depression and mood stabilization 08/02/14  Yes Shuvon B Rankin, NP  citalopram (CELEXA) 20 MG tablet Take 1 tablet (20 mg total) by mouth daily. For depression Patient not taking: Reported on 11/23/2014 08/02/14   Shuvon B Rankin, NP  hydrALAZINE (APRESOLINE) 25 MG tablet Take 1 tablet (25 mg total) by mouth 3 (three) times daily. Patient not taking: Reported on 11/23/2014 10/13/14   Charlesetta Shanks, MD  hydrOXYzine (ATARAX/VISTARIL) 25 MG tablet Take 1 tablet (25 mg total) by mouth every 6 (six) hours as needed for anxiety. Patient not taking: Reported on 11/23/2014 08/02/14   Shuvon B Rankin, NP  loratadine (CLARITIN) 10 MG tablet Take 1 tablet (10 mg total) by mouth daily. (May be purchased from over the counter): For allergies Patient not taking: Reported on 07/02/2014 03/02/14   Encarnacion Slates, NP  metFORMIN (GLUCOPHAGE) 500 MG tablet Take 1 tablet (500 mg total) by mouth every evening. For Type 2 Diabetes Patient not taking: Reported on 11/23/2014 08/02/14   Shuvon B Rankin, NP  nicotine (NICODERM CQ - DOSED IN MG/24 HOURS) 21 mg/24hr patch Place 1 patch (21 mg total) onto the skin daily. For smoking cessation Patient not taking: Reported on 11/23/2014 08/02/14   Shuvon B Rankin, NP  omeprazole (PRILOSEC) 20 MG capsule Take 1 capsule (20 mg total) by mouth daily. For GERD Patient not taking: Reported on 11/23/2014 08/02/14   Shuvon B Rankin, NP  ondansetron (ZOFRAN ODT) 4 MG disintegrating tablet 4mg  ODT q4 hours prn nausea/vomit Patient not  taking: Reported on 11/23/2014 09/14/14   Comer Locket, PA-C  pantoprazole (PROTONIX) 20 MG tablet Take 1 tablet (20 mg total) by mouth daily. Patient not taking: Reported on 11/23/2014 09/14/14   Comer Locket, PA-C  polyethylene glycol powder (GLYCOLAX/MIRALAX) powder Take 17 g by mouth 2 (two) times daily. Until daily soft stools  OTC Patient not taking: Reported on 11/23/2014 09/14/14   Comer Locket, PA-C  ranitidine (ZANTAC) 150 MG tablet Take 1 tablet (150 mg total) by mouth 2 (two) times daily. Patient not taking: Reported on 11/23/2014 09/14/14   Comer Locket, PA-C   BP 123/73 mmHg  Pulse 79  Temp(Src) 97.9  F (36.6 C) (Oral)  Resp 17  Ht 5\' 4"  (1.626 m)  Wt 235 lb (106.595 kg)  BMI 40.32 kg/m2  SpO2 97%  LMP  (Approximate) Physical Exam  Constitutional: She is oriented to person, place, and time.  MM dry, uncomfortable   HENT:  Head: Normocephalic.  MM dry   Eyes: Conjunctivae are normal. Pupils are equal, round, and reactive to light.  Neck: Normal range of motion. Neck supple.  Cardiovascular: Normal rate, regular rhythm and normal heart sounds.   Pulmonary/Chest: Breath sounds normal. No respiratory distress. She has no wheezes. She has no rales.  Reproducible L chest tenderness.   Abdominal:  Firm, mild diffuse tenderness, no rebound   Musculoskeletal: Normal range of motion. She exhibits no edema or tenderness.  Neurological: She is alert and oriented to person, place, and time. No cranial nerve deficit. Coordination normal.  Skin: Skin is warm and dry.  Psychiatric: She has a normal mood and affect. Her behavior is normal. Judgment and thought content normal.  Nursing note and vitals reviewed.   ED Course  Procedures (including critical care time) Labs Review Labs Reviewed  CBC WITH DIFFERENTIAL/PLATELET - Abnormal; Notable for the following:    Neutrophils Relative % 78 (*)    All other components within normal limits  COMPREHENSIVE METABOLIC PANEL -  Abnormal; Notable for the following:    Potassium 2.7 (*)    Chloride 95 (*)    Glucose, Bld 165 (*)    Creatinine, Ser 1.02 (*)    Total Protein 8.5 (*)    Anion gap 18 (*)    All other components within normal limits  URINALYSIS, ROUTINE W REFLEX MICROSCOPIC (NOT AT [facility]) - Abnormal; Notable for the following:    APPearance CLOUDY (*)    Leukocytes, UA MODERATE (*)    All other components within normal limits  URINE MICROSCOPIC-ADD ON - Abnormal; Notable for the following:    Squamous Epithelial / LPF FEW (*)    Bacteria, UA MANY (*)    Casts HYALINE CASTS (*)    All other components within normal limits  LIPASE, BLOOD  I-STAT TROPOININ, ED    Imaging Review Dg Chest 2 View  11/23/2014   CLINICAL DATA:  Abdominal pain, diarrhea, nausea and cough. History of asthma.  EXAM: CHEST - 2 VIEW  COMPARISON:  11/03/2014  FINDINGS: The heart size and mediastinal contours are within normal limits. Lung volumes are low bilaterally. There is mild bibasilar atelectasis. There is no evidence of pulmonary edema, consolidation, pneumothorax, nodule or pleural fluid. The visualized skeletal structures are unremarkable.  IMPRESSION: No active disease.   Electronically Signed   By: Aletta Edouard M.D.   On: 11/23/2014 12:45   Ct Abdomen Pelvis W Contrast  11/23/2014   CLINICAL DATA:  Diarrhea and abdominal pain for 1 week  EXAM: CT ABDOMEN AND PELVIS WITH CONTRAST  TECHNIQUE: Multidetector CT imaging of the abdomen and pelvis was performed using the standard protocol following bolus administration of intravenous contrast.  CONTRAST:  160mL OMNIPAQUE IOHEXOL 300 MG/ML  SOLN  COMPARISON:  None.  FINDINGS: Lung bases are free of acute infiltrate or sizable effusion.  The liver, gallbladder, spleen, adrenal glands and pancreas are within normal limits. The kidneys are well visualized bilaterally with a normal enhancement pattern. Delayed images demonstrate normal excretion of contrast.  The appendix is within  normal limits. The bladder is partially distended. No pelvic mass lesion is seen. Bony structures are within normal limits. The uterus  demonstrates calcifications consistent with calcified uterine fibroids.  IMPRESSION: No acute abnormality noted.   Electronically Signed   By: Inez Catalina M.D.   On: 11/23/2014 14:06     EKG Interpretation   Date/Time:  Thursday November 23 2014 11:44:23 EDT Ventricular Rate:  95 PR Interval:  171 QRS Duration: 96 QT Interval:  360 QTC Calculation: 452 R Axis:   -7 Text Interpretation:  Sinus rhythm Probable left atrial enlargement Left  ventricular hypertrophy Borderline abnrm T, anterolateral leads Baseline  wander in lead(s) II aVR aVF nonspecific changes since previous  Confirmed  by YAO  MD, DAVID ([id number]) on 11/23/2014 11:56:24 AM      MDM   Final diagnoses:  Abdominal pain  Abdominal pain   DAJANE VALLI is a 49 y.o. female here with ab pain, chest pain for a week. Likely gastro vs pyelo vs pancreatitis. Chest pain unlikely ACS and symptoms for several days so trop x 1 sufficient.   3:22 PM UA + UTI. K 2.7, ordered 3 runs of K and Kdur. Given cipro in the ED. Tolerated PO fluids. Will dc home with cipro, kdur. Recommend repeat BMP in a week.     Wandra Arthurs, MD 11/23/14 [id number]

## 2014-11-23 NOTE — ED Notes (Signed)
AVS explained in detail. Knows to take medications as prescribed. No other c/c. Given a bus ticket and food (pt is diabetic-gave [place] sandwich and diet ginger ale for continuing potassium needs). Ambulatory with steady gait.

## 2014-11-23 NOTE — ED Notes (Signed)
Pt c/o abd pain and diarrhea x week that has been constant. Pt states that she has been nauseated but only having phlegm come up.

## 2014-11-23 NOTE — ED Notes (Signed)
Delay in antbx admin d/t no available pump channel

## 2014-11-23 NOTE — ED Notes (Signed)
Run #2 K+ and Cipro running concurrent, compatible per Micromedex. No swelling or redness or swelling noted at IV insertion site

## 2014-12-08 ENCOUNTER — Observation Stay (HOSPITAL_COMMUNITY)
Admission: EM | Admit: 2014-12-08 | Discharge: 2014-12-11 | Disposition: A | Discharge status: Home / Self Care | Payer: No Typology Code available for payment source | Source: Intra-hospital | Attending: Internal Medicine | Admitting: Internal Medicine

## 2014-12-08 ENCOUNTER — Emergency Department (HOSPITAL_COMMUNITY): Payer: Self-pay

## 2014-12-08 ENCOUNTER — Encounter (HOSPITAL_COMMUNITY): Payer: Self-pay

## 2014-12-08 DIAGNOSIS — F251 Schizoaffective disorder, depressive type: Secondary | ICD-10-CM | POA: Diagnosis present

## 2014-12-08 DIAGNOSIS — M2669 Other specified disorders of temporomandibular joint: Secondary | ICD-10-CM | POA: Insufficient documentation

## 2014-12-08 DIAGNOSIS — Z91018 Allergy to other foods: Secondary | ICD-10-CM | POA: Insufficient documentation

## 2014-12-08 DIAGNOSIS — Z87891 Personal history of nicotine dependence: Secondary | ICD-10-CM | POA: Insufficient documentation

## 2014-12-08 DIAGNOSIS — F419 Anxiety disorder, unspecified: Secondary | ICD-10-CM | POA: Insufficient documentation

## 2014-12-08 DIAGNOSIS — E669 Obesity, unspecified: Secondary | ICD-10-CM | POA: Insufficient documentation

## 2014-12-08 DIAGNOSIS — R29898 Other symptoms and signs involving the musculoskeletal system: Secondary | ICD-10-CM

## 2014-12-08 DIAGNOSIS — I639 Cerebral infarction, unspecified: Secondary | ICD-10-CM | POA: Insufficient documentation

## 2014-12-08 DIAGNOSIS — D649 Anemia, unspecified: Secondary | ICD-10-CM | POA: Insufficient documentation

## 2014-12-08 DIAGNOSIS — R4781 Slurred speech: Secondary | ICD-10-CM

## 2014-12-08 DIAGNOSIS — R42 Dizziness and giddiness: Secondary | ICD-10-CM | POA: Insufficient documentation

## 2014-12-08 DIAGNOSIS — R531 Weakness: Principal | ICD-10-CM | POA: Diagnosis present

## 2014-12-08 DIAGNOSIS — E876 Hypokalemia: Secondary | ICD-10-CM | POA: Insufficient documentation

## 2014-12-08 DIAGNOSIS — H538 Other visual disturbances: Secondary | ICD-10-CM | POA: Insufficient documentation

## 2014-12-08 DIAGNOSIS — Z7982 Long term (current) use of aspirin: Secondary | ICD-10-CM | POA: Insufficient documentation

## 2014-12-08 DIAGNOSIS — E119 Type 2 diabetes mellitus without complications: Secondary | ICD-10-CM | POA: Insufficient documentation

## 2014-12-08 DIAGNOSIS — E1165 Type 2 diabetes mellitus with hyperglycemia: Secondary | ICD-10-CM | POA: Insufficient documentation

## 2014-12-08 DIAGNOSIS — G43909 Migraine, unspecified, not intractable, without status migrainosus: Secondary | ICD-10-CM | POA: Insufficient documentation

## 2014-12-08 DIAGNOSIS — F319 Bipolar disorder, unspecified: Secondary | ICD-10-CM | POA: Insufficient documentation

## 2014-12-08 DIAGNOSIS — E780 Pure hypercholesterolemia: Secondary | ICD-10-CM | POA: Insufficient documentation

## 2014-12-08 DIAGNOSIS — G35 Multiple sclerosis: Secondary | ICD-10-CM

## 2014-12-08 DIAGNOSIS — E785 Hyperlipidemia, unspecified: Secondary | ICD-10-CM | POA: Insufficient documentation

## 2014-12-08 DIAGNOSIS — H532 Diplopia: Secondary | ICD-10-CM | POA: Insufficient documentation

## 2014-12-08 DIAGNOSIS — J45909 Unspecified asthma, uncomplicated: Secondary | ICD-10-CM | POA: Insufficient documentation

## 2014-12-08 DIAGNOSIS — Z6839 Body mass index (BMI) 39.0-39.9, adult: Secondary | ICD-10-CM | POA: Insufficient documentation

## 2014-12-08 DIAGNOSIS — F1410 Cocaine abuse, uncomplicated: Secondary | ICD-10-CM | POA: Insufficient documentation

## 2014-12-08 DIAGNOSIS — F1010 Alcohol abuse, uncomplicated: Secondary | ICD-10-CM | POA: Insufficient documentation

## 2014-12-08 DIAGNOSIS — Z880 Allergy status to penicillin: Secondary | ICD-10-CM | POA: Insufficient documentation

## 2014-12-08 DIAGNOSIS — I69354 Hemiplegia and hemiparesis following cerebral infarction affecting left non-dominant side: Secondary | ICD-10-CM | POA: Insufficient documentation

## 2014-12-08 DIAGNOSIS — N179 Acute kidney failure, unspecified: Secondary | ICD-10-CM | POA: Insufficient documentation

## 2014-12-08 DIAGNOSIS — M1389 Other specified arthritis, multiple sites: Secondary | ICD-10-CM | POA: Insufficient documentation

## 2014-12-08 DIAGNOSIS — I10 Essential (primary) hypertension: Secondary | ICD-10-CM | POA: Diagnosis present

## 2014-12-08 DIAGNOSIS — M109 Gout, unspecified: Secondary | ICD-10-CM | POA: Insufficient documentation

## 2014-12-08 DIAGNOSIS — K219 Gastro-esophageal reflux disease without esophagitis: Secondary | ICD-10-CM | POA: Insufficient documentation

## 2014-12-08 LAB — I-STAT CHEM 8, ED
BUN: 38 mg/dL — ABNORMAL HIGH (ref 6–20)
Calcium, Ion: 1.1 mmol/L — ABNORMAL LOW (ref 1.12–1.23)
Chloride: 93 mmol/L — ABNORMAL LOW (ref 101–111)
Creatinine, Ser: 2.2 mg/dL — ABNORMAL HIGH (ref 0.44–1.00)
Glucose, Bld: 132 mg/dL — ABNORMAL HIGH (ref 65–99)
HEMATOCRIT: 34 % — AB (ref 36.0–46.0)
Hemoglobin: 11.6 g/dL — ABNORMAL LOW (ref 12.0–15.0)
Potassium: 2.8 mmol/L — ABNORMAL LOW (ref 3.5–5.1)
SODIUM: 136 mmol/L (ref 135–145)
TCO2: 28 mmol/L (ref 0–100)

## 2014-12-08 LAB — RAPID URINE DRUG SCREEN, HOSP PERFORMED
AMPHETAMINES: NOT DETECTED
Barbiturates: NOT DETECTED
Benzodiazepines: NOT DETECTED
Cocaine: NOT DETECTED
OPIATES: NOT DETECTED
Tetrahydrocannabinol: NOT DETECTED

## 2014-12-08 LAB — URINALYSIS, ROUTINE W REFLEX MICROSCOPIC
Bilirubin Urine: NEGATIVE
Glucose, UA: NEGATIVE mg/dL
HGB URINE DIPSTICK: NEGATIVE
Ketones, ur: NEGATIVE mg/dL
Nitrite: NEGATIVE
PROTEIN: NEGATIVE mg/dL
Specific Gravity, Urine: 1.009 (ref 1.005–1.030)
UROBILINOGEN UA: 0.2 mg/dL (ref 0.0–1.0)
pH: 6 (ref 5.0–8.0)

## 2014-12-08 LAB — COMPREHENSIVE METABOLIC PANEL
ALBUMIN: 3.9 g/dL (ref 3.5–5.0)
ALK PHOS: 73 U/L (ref 38–126)
ALT: 15 U/L (ref 14–54)
ANION GAP: 13 (ref 5–15)
AST: 21 U/L (ref 15–41)
BUN: 38 mg/dL — AB (ref 6–20)
CO2: 30 mmol/L (ref 22–32)
CREATININE: 2.16 mg/dL — AB (ref 0.44–1.00)
Calcium: 9.6 mg/dL (ref 8.9–10.3)
Chloride: 93 mmol/L — ABNORMAL LOW (ref 101–111)
GFR calc Af Amer: 30 mL/min — ABNORMAL LOW (ref >60)
GFR calc non Af Amer: 26 mL/min — ABNORMAL LOW (ref >60)
Glucose, Bld: 130 mg/dL — ABNORMAL HIGH (ref 65–99)
POTASSIUM: 2.9 mmol/L — AB (ref 3.5–5.1)
Sodium: 136 mmol/L (ref 135–145)
Total Bilirubin: 0.2 mg/dL — ABNORMAL LOW (ref 0.3–1.2)
Total Protein: 7.2 g/dL (ref 6.5–8.1)

## 2014-12-08 LAB — DIFFERENTIAL
BASOS PCT: 0 % (ref 0–1)
Basophils Absolute: 0 10*3/uL (ref 0.0–0.1)
EOS ABS: 0 10*3/uL (ref 0.0–0.7)
EOS PCT: 1 % (ref 0–5)
Lymphocytes Relative: 23 % (ref 12–46)
Lymphs Abs: 1.7 10*3/uL (ref 0.7–4.0)
MONO ABS: 0.5 10*3/uL (ref 0.1–1.0)
Monocytes Relative: 6 % (ref 3–12)
Neutro Abs: 5.2 10*3/uL (ref 1.7–7.7)
Neutrophils Relative %: 70 % (ref 43–77)

## 2014-12-08 LAB — CBC
HEMATOCRIT: 31.4 % — AB (ref 36.0–46.0)
Hemoglobin: 10.3 g/dL — ABNORMAL LOW (ref 12.0–15.0)
MCH: 27.5 pg (ref 26.0–34.0)
MCHC: 32.8 g/dL (ref 30.0–36.0)
MCV: 83.7 fL (ref 78.0–100.0)
PLATELETS: 266 10*3/uL (ref 150–400)
RBC: 3.75 MIL/uL — AB (ref 3.87–5.11)
RDW: 13 % (ref 11.5–15.5)
WBC: 7.4 10*3/uL (ref 4.0–10.5)

## 2014-12-08 LAB — URINE MICROSCOPIC-ADD ON

## 2014-12-08 LAB — PROTIME-INR
INR: 1.02 (ref 0.00–1.49)
PROTHROMBIN TIME: 13.6 s (ref 11.6–15.2)

## 2014-12-08 LAB — I-STAT TROPONIN, ED: Troponin i, poc: 0 ng/mL (ref 0.00–0.08)

## 2014-12-08 LAB — ETHANOL: Alcohol, Ethyl (B): <5 mg/dL (ref <5)

## 2014-12-08 LAB — APTT: APTT: 28 s (ref 24–37)

## 2014-12-08 LAB — CBG MONITORING, ED: Glucose-Capillary: 111 mg/dL — ABNORMAL HIGH (ref 65–99)

## 2014-12-08 MED ORDER — POTASSIUM CHLORIDE IN NACL 40-0.9 MEQ/L-% IV SOLN
INTRAVENOUS | Status: AC
  Administered 2014-12-08: 75 mL/h via INTRAVENOUS
  Filled 2014-12-08: qty 1000

## 2014-12-08 MED ORDER — ALBUTEROL SULFATE (2.5 MG/3ML) 0.083% IN NEBU: 2.5000 mg | INHALATION_SOLUTION | Freq: Four times a day (QID) | RESPIRATORY_TRACT | Status: DC | PRN

## 2014-12-08 MED ORDER — QUETIAPINE FUMARATE ER 200 MG PO TB24
600.0000 mg | ORAL_TABLET | Freq: Every day | ORAL | Status: DC
  Administered 2014-12-08: 600 mg via ORAL
  Filled 2014-12-08: qty 3

## 2014-12-08 MED ORDER — FOLIC ACID 1 MG PO TABS
1.0000 mg | ORAL_TABLET | Freq: Every day | ORAL | Status: DC
  Administered 2014-12-09 – 2014-12-11 (×3): 1 mg via ORAL
  Filled 2014-12-08 (×3): qty 1

## 2014-12-08 MED ORDER — THIAMINE HCL 100 MG/ML IJ SOLN
100.0000 mg | Freq: Every day | INTRAMUSCULAR | Status: DC
  Filled 2014-12-08: qty 2

## 2014-12-08 MED ORDER — ATENOLOL 100 MG PO TABS: 100.0000 mg | ORAL_TABLET | Freq: Every day | ORAL | Status: DC

## 2014-12-08 MED ORDER — HEPARIN SODIUM (PORCINE) 5000 UNIT/ML IJ SOLN
5000.0000 [IU] | Freq: Three times a day (TID) | INTRAMUSCULAR | Status: DC
  Administered 2014-12-09 – 2014-12-10 (×3): 5000 [IU] via SUBCUTANEOUS
  Filled 2014-12-08 (×3): qty 1

## 2014-12-08 MED ORDER — OXCARBAZEPINE 300 MG PO TABS
600.0000 mg | ORAL_TABLET | Freq: Two times a day (BID) | ORAL | Status: DC
  Administered 2014-12-08 – 2014-12-11 (×6): 600 mg via ORAL
  Filled 2014-12-08 (×7): qty 2

## 2014-12-08 MED ORDER — LORAZEPAM 2 MG/ML IJ SOLN: 1.0000 mg | Freq: Four times a day (QID) | INTRAMUSCULAR | Status: DC | PRN

## 2014-12-08 MED ORDER — CITALOPRAM HYDROBROMIDE 10 MG PO TABS
20.0000 mg | ORAL_TABLET | Freq: Every day | ORAL | Status: DC
  Administered 2014-12-09: 20 mg via ORAL
  Filled 2014-12-08: qty 2

## 2014-12-08 MED ORDER — SODIUM CHLORIDE 0.9 % IV BOLUS (SEPSIS)
1000.0000 mL | Freq: Once | INTRAVENOUS | Status: AC
  Administered 2014-12-08: 1000 mL via INTRAVENOUS

## 2014-12-08 MED ORDER — ASPIRIN EC 81 MG PO TBEC
81.0000 mg | DELAYED_RELEASE_TABLET | Freq: Every day | ORAL | Status: DC
  Administered 2014-12-09 – 2014-12-11 (×3): 81 mg via ORAL
  Filled 2014-12-08 (×3): qty 1

## 2014-12-08 MED ORDER — VITAMIN B-1 100 MG PO TABS
100.0000 mg | ORAL_TABLET | Freq: Every day | ORAL | Status: DC
  Administered 2014-12-09 – 2014-12-11 (×3): 100 mg via ORAL
  Filled 2014-12-08 (×3): qty 1

## 2014-12-08 MED ORDER — ADULT MULTIVITAMIN W/MINERALS CH
1.0000 | ORAL_TABLET | Freq: Every day | ORAL | Status: DC
  Administered 2014-12-09 – 2014-12-11 (×3): 1 via ORAL
  Filled 2014-12-08 (×3): qty 1

## 2014-12-08 MED ORDER — POLYETHYLENE GLYCOL 3350 17 G PO PACK
17.0000 g | PACK | Freq: Two times a day (BID) | ORAL | Status: DC
  Administered 2014-12-08 – 2014-12-11 (×6): 17 g via ORAL
  Filled 2014-12-08 (×6): qty 1

## 2014-12-08 MED ORDER — PANTOPRAZOLE SODIUM 20 MG PO TBEC
20.0000 mg | DELAYED_RELEASE_TABLET | Freq: Every day | ORAL | Status: DC
  Administered 2014-12-09: 20 mg via ORAL
  Filled 2014-12-08: qty 1

## 2014-12-08 MED ORDER — INSULIN ASPART 100 UNIT/ML ~~LOC~~ SOLN
0.0000 [IU] | Freq: Three times a day (TID) | SUBCUTANEOUS | Status: DC
  Administered 2014-12-09: 3 [IU] via SUBCUTANEOUS
  Administered 2014-12-09: 2 [IU] via SUBCUTANEOUS
  Administered 2014-12-09 – 2014-12-10 (×4): 3 [IU] via SUBCUTANEOUS
  Administered 2014-12-11: 2 [IU] via SUBCUTANEOUS

## 2014-12-08 MED ORDER — ATORVASTATIN CALCIUM 40 MG PO TABS
40.0000 mg | ORAL_TABLET | Freq: Every day | ORAL | Status: DC
  Administered 2014-12-09 – 2014-12-11 (×3): 40 mg via ORAL
  Filled 2014-12-08 (×3): qty 1

## 2014-12-08 MED ORDER — POTASSIUM CHLORIDE CRYS ER 20 MEQ PO TBCR
40.0000 meq | EXTENDED_RELEASE_TABLET | Freq: Two times a day (BID) | ORAL | Status: AC
  Administered 2014-12-08 – 2014-12-09 (×2): 40 meq via ORAL
  Filled 2014-12-08 (×2): qty 2

## 2014-12-08 MED ORDER — LORATADINE 10 MG PO TABS
10.0000 mg | ORAL_TABLET | Freq: Every day | ORAL | Status: DC
  Administered 2014-12-09 – 2014-12-11 (×3): 10 mg via ORAL
  Filled 2014-12-08 (×3): qty 1

## 2014-12-08 MED ORDER — SODIUM CHLORIDE 0.9 % IJ SOLN
3.0000 mL | Freq: Two times a day (BID) | INTRAMUSCULAR | Status: DC
  Administered 2014-12-08 – 2014-12-11 (×5): 3 mL via INTRAVENOUS

## 2014-12-08 MED ORDER — LORAZEPAM 1 MG PO TABS
1.0000 mg | ORAL_TABLET | Freq: Four times a day (QID) | ORAL | Status: DC | PRN
  Filled 2014-12-08: qty 1

## 2014-12-08 NOTE — ED Notes (Signed)
Attempted Report x1.   

## 2014-12-08 NOTE — ED Notes (Signed)
Patient returned from MRI.

## 2014-12-08 NOTE — H&P (Signed)
Date: 12/08/2014               Patient Name:  Maureen Swanson MRN: [id number]  DOB: Jan 23, 1966 Age / Sex: 49 y.o., female   PCP: Elbert Ewings, FNP         Medical Service: Internal Medicine Teaching Service         Attending Physician: Dr. Sid Falcon, MD    First Contact: Dr. Arcelia Jew Pager: [phone]  Second Contact: Dr. Gordy Levan Pager: [phone]       After Hours (After 5p/  First Contact Pager: [phone]  weekends / holidays): Second Contact Pager: [phone]   Chief Complaint: left leg weakness  History of Present Illness: Pt is a 49 y/o F w/ PMHx depression, bipolar d/o, GERD, HLD, polysubstance abuse, and asthma who presents w/ left sided weakness, blurry vision, and dizziness x 1 week that has progressively gotten worse. She was previously admitted to [facility] regional on 5/20 w/ blurred vision, diplopia, nausea, and left sided weakness. She received TPA in the ED, MRI was negative and she was dx w/ probable conversion disorder. Since discharge she has had left sided weakness and has had to rely on her cane and walker more. Howerver, over the past week her left side weakness has worsened along w/ dizziness and blurry vision. Dizziness is constant and worsens with movement, currently she feels like the room is spinning. She has had decreased appetite x 3 days. Denies diarrhea, nausea or vomiting. Denies illicit drug use ( last used cocaine 07/2014), EtOH use, and cigarette smoking. CT head and MRI of head were negative this admission. She follows w/ Granville Lewis at [facility] for her psychiatric problems, she has an appt w/ her on July 22nd. She states she is depressed at home and has frequent crying spells. Currently she is sad that she b/c she is at the hospital. Denies SI, last time was 2 months ago when she was admitted to [facility]. Denies HI. She was admitted for stroke workup.   Meds: Current Facility-Administered Medications  Medication Dose Route Frequency Provider Last Rate Last Dose    . sodium chloride 0.9 % bolus 1,000 mL  1,000 mL Intravenous Once Ankit Nanavati, MD      . sodium chloride 0.9 % bolus 1,000 mL  1,000 mL Intravenous Once Varney Biles, MD       Current Outpatient Prescriptions  Medication Sig Dispense Refill  . albuterol (PROVENTIL HFA;VENTOLIN HFA) 108 (90 BASE) MCG/ACT inhaler Inhale 2 puffs into the lungs every 6 (six) hours as needed for wheezing or shortness of breath.    Marland Kitchen aspirin 81 MG tablet Take 81 mg by mouth daily.    Marland Kitchen atenolol (TENORMIN) 100 MG tablet Take 1 tablet (100 mg total) by mouth daily. For hypertension    . atorvastatin (LIPITOR) 40 MG tablet Take 40 mg by mouth daily.    . baclofen (LIORESAL) 10 MG tablet Take 10 mg by mouth 2 (two) times daily.    . celecoxib (CELEBREX) 100 MG capsule Take 1 capsule (100 mg total) by mouth 2 (two) times daily. For pain/arthritis    . cetirizine (ZYRTEC) 10 MG tablet Take 10 mg by mouth daily.    Marland Kitchen Dexlansoprazole (DEXILANT) 30 MG capsule Take 30 mg by mouth daily.    . furosemide (LASIX) 40 MG tablet Take 40 mg by mouth daily.    Marland Kitchen gabapentin (NEURONTIN) 400 MG capsule Take 400 mg by mouth 2 (two) times daily.    Marland Kitchen  glipiZIDE (GLUCOTROL) 10 MG tablet Take 10 mg by mouth daily before breakfast.    . hydrochlorothiazide (HYDRODIURIL) 25 MG tablet Take 1 tablet (25 mg total) by mouth daily. For hypertension    . HYDROcodone-acetaminophen (NORCO/VICODIN) 5-325 MG per tablet Take 1-2 tablets by mouth every 6 (six) hours as needed. 6 tablet 0  . hydrOXYzine (ATARAX/VISTARIL) 25 MG tablet Take 1 tablet (25 mg total) by mouth every 6 (six) hours as needed for anxiety. 30 tablet 0  . hydrOXYzine (ATARAX/VISTARIL) 25 MG tablet Take 25 mg by mouth daily as needed for itching.    . indomethacin (INDOCIN) 25 MG capsule Take 25 mg by mouth 3 (three) times daily as needed for mild pain.    . metFORMIN (GLUCOPHAGE) 1000 MG tablet Take 1,000 mg by mouth 2 (two) times daily with a meal.    . metoCLOPramide (REGLAN)  10 MG tablet Take 10 mg by mouth 4 (four) times daily.    . naltrexone (DEPADE) 50 MG tablet Take 50 mg by mouth daily.    Marland Kitchen omeprazole (PRILOSEC) 20 MG capsule Take 1 capsule (20 mg total) by mouth daily. For GERD    . ondansetron (ZOFRAN ODT) 4 MG disintegrating tablet 4mg  ODT q4 hours prn nausea/vomit 8 tablet 0  . oxcarbazepine (TRILEPTAL) 600 MG tablet Take 1 tablet (600 mg total) by mouth 2 (two) times daily. For mood stabilization (Patient taking differently: Take 600 mg by mouth every evening. For mood stabilization) 60 tablet 0  . Oyster Shell Calcium (OYSCO 500) 500 MG TABS Take 1 tablet by mouth daily.    . potassium chloride SA (K-DUR,KLOR-CON) 20 MEQ tablet Take 1 tablet (20 mEq total) by mouth daily. 3 tablet 0  . QUEtiapine (SEROQUEL XR) 300 MG 24 hr tablet Take 2 tablets (600 mg total) by mouth daily at 8 pm. For depression and mood stabilization 60 tablet 0  . ciprofloxacin (CIPRO) 500 MG tablet Take 1 tablet (500 mg total) by mouth 2 (two) times daily. One po bid x 7 days (Patient not taking: Reported on 12/08/2014) 14 tablet 0  . citalopram (CELEXA) 20 MG tablet Take 1 tablet (20 mg total) by mouth daily. For depression (Patient not taking: Reported on 11/23/2014) 30 tablet 0  . hydrALAZINE (APRESOLINE) 25 MG tablet Take 1 tablet (25 mg total) by mouth 3 (three) times daily. (Patient not taking: Reported on 11/23/2014) 90 tablet 0  . loratadine (CLARITIN) 10 MG tablet Take 1 tablet (10 mg total) by mouth daily. (May be purchased from over the counter): For allergies (Patient not taking: Reported on 07/02/2014)    . metFORMIN (GLUCOPHAGE) 500 MG tablet Take 1 tablet (500 mg total) by mouth every evening. For Type 2 Diabetes (Patient not taking: Reported on 11/23/2014)    . nicotine (NICODERM CQ - DOSED IN MG/24 HOURS) 21 mg/24hr patch Place 1 patch (21 mg total) onto the skin daily. For smoking cessation (Patient not taking: Reported on 11/23/2014) 30 patch 0  . omeprazole (PRILOSEC) 40 MG  capsule Take 40 mg by mouth daily.    . pantoprazole (PROTONIX) 20 MG tablet Take 1 tablet (20 mg total) by mouth daily. (Patient not taking: Reported on 11/23/2014) 20 tablet 0  . polyethylene glycol powder (GLYCOLAX/MIRALAX) powder Take 17 g by mouth 2 (two) times daily. Until daily soft stools  OTC (Patient not taking: Reported on 11/23/2014) 225 g 0  . ranitidine (ZANTAC) 150 MG tablet Take 1 tablet (150 mg total) by mouth 2 (two)  times daily. (Patient not taking: Reported on 11/23/2014) 60 tablet 0    Allergies: Allergies as of 12/08/2014 - Review Complete 12/08/2014  Allergen Reaction Noted  . Chocolate Hives 07/07/2011  . Orange Hives 07/07/2011  . Penicillins Hives   . Tomato Hives 09/07/2012   Past Medical History  Diagnosis Date  . High cholesterol   . Depression   . Gout   . Anxiety   . Bipolar 1 disorder   . GERD (gastroesophageal reflux disease)   . Substance abuse     crack cocaine  . Headache(784.0)     migraines  . TMJ (temporomandibular joint disorder)   . Asthma     daily and prn inhalers  . Arthritis     back and knees  . Gastric ulcer   . 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 Hypertension     under control, has been on med. x 12 yrs.  . Diabetes mellitus     diet-controlled   Past Surgical History  Procedure Laterality Date  . Knee arthroscopy w/ partial medial meniscectomy  05/01/2010    right  . Breast lumpectomy with needle localization  04/19/2012    Procedure: BREAST LUMPECTOMY WITH NEEDLE LOCALIZATION;  Surgeon: [name], MD;  Location: [facility];  Service: General;  Laterality: Right;   Family History  Problem Relation Age of Onset  . Diabetes Mother   . Cancer Mother     breast  . Cancer Father    History   Social History  . Marital Status: Single    Spouse Name: N/A  . Number of Children: N/A  . Years of Education: N/A   Occupational History  . Not on file.   Social History Main Topics    . Smoking status: Former Smoker -- 15 years    Quit date: 08/02/2012  . Smokeless tobacco: Never Used     Comment: quit smoking 02/2012  . Alcohol Use: No  . Drug Use: No     Comment: last use 08/02/2012  . Sexual Activity: Not on file   Other Topics Concern  . Not on file   Social History Narrative    Review of Systems  Constitutional: Negative for fever and chills.  Eyes: Positive for blurred vision.  Respiratory: Positive for cough (white phlegm) and shortness of breath (improves w/ inhalers).   Cardiovascular: Positive for chest pain.  Gastrointestinal: Positive for heartburn, abdominal pain (lower abd pain from acid reflux) and constipation (last BM 3 days ago). Negative for nausea, vomiting, diarrhea, blood in stool and melena.  Genitourinary: Negative for dysuria.  Neurological: Positive for dizziness, focal weakness (left side) and weakness (left arm and leg). Negative for headaches.  Psychiatric/Behavioral: Positive for depression. Negative for suicidal ideas and substance abuse.    Physical Exam: Blood pressure 126/87, pulse 63, temperature 98.9 F (37.2 C), temperature source Oral, resp. rate 24, SpO2 100 %. Physical Exam  Constitutional: She appears well-developed and well-nourished. No distress.  HENT:  Head: Normocephalic and atraumatic.  Mouth/Throat: Oropharynx is clear and moist. No oropharyngeal exudate.  Eyes: Conjunctivae and EOM are normal. Pupils are equal, round, and reactive to light. No scleral icterus.  Cardiovascular: Normal rate and regular rhythm.   No murmur heard. Pulmonary/Chest: Effort normal and breath sounds normal. She has no wheezes. She has no rales.  Abdominal: Soft. Bowel sounds are normal. She exhibits distension. There is tenderness (lower abd).  Musculoskeletal: Normal range of  motion. She exhibits no edema.  Neurological: No cranial nerve deficit.  4/5 left UE and LE strength, 5/5 right UE and LE strength, slower on hand to nose  with left hand, able to do rapid movements with both hands, able to to heel to shin b/l  Skin: Skin is warm and dry. She is not diaphoretic.    Lab results: Basic Metabolic Panel:  Recent Labs  12/08/14 1721 12/08/14 1735  NA 136 136  K 2.9* 2.8*  CL 93* 93*  CO2 30  --   GLUCOSE 130* 132*  BUN 38* 38*  CREATININE 2.16* 2.20*  CALCIUM 9.6  --    Liver Function Tests:  Recent Labs  12/08/14 1721  AST 21  ALT 15  ALKPHOS 73  BILITOT 0.2*  PROT 7.2  ALBUMIN 3.9   CBC:  Recent Labs  12/08/14 1721 12/08/14 1735  WBC 7.4  --   NEUTROABS 5.2  --   HGB 10.3* 11.6*  HCT 31.4* 34.0*  MCV 83.7  --   PLT 266  --    CBG:  Recent Labs  12/08/14 1735  GLUCAP 111*   Coagulation:  Recent Labs  12/08/14 1721  LABPROT 13.6  INR 1.02   Urine Drug Screen: Drugs of Abuse     Component Value Date/Time   LABOPIA NONE DETECTED 12/08/2014 1809   LABOPIA NEG 10/29/2009 2213   COCAINSCRNUR NONE DETECTED 12/08/2014 1809   COCAINSCRNUR NEG 10/29/2009 2213   LABBENZ NONE DETECTED 12/08/2014 1809   LABBENZ POS* 10/29/2009 2213   AMPHETMU NONE DETECTED 12/08/2014 1809   AMPHETMU NEG 10/29/2009 2213   THCU NONE DETECTED 12/08/2014 1809   LABBARB NONE DETECTED 12/08/2014 1809    Alcohol Level:  Recent Labs  12/08/14 1721  ETH <5   Urinalysis:  Recent Labs  12/08/14 1809  COLORURINE YELLOW  LABSPEC 1.009  PHURINE 6.0  GLUCOSEU NEGATIVE  HGBUR NEGATIVE  BILIRUBINUR NEGATIVE  KETONESUR NEGATIVE  PROTEINUR NEGATIVE  UROBILINOGEN 0.2  NITRITE NEGATIVE  LEUKOCYTESUR TRACE*     Imaging results:  Ct Head Wo Contrast  12/08/2014   ADDENDUM REPORT: 12/08/2014 17:43  ADDENDUM: The original report was by Dr. Van Clines. The following addendum is by Dr. Van Clines:  These results were called by telephone at the time of interpretation on 12/08/2014 at 5:35pm to Dr. Armida Sans, who verbally acknowledged these results.   Electronically Signed   By: Van Clines M.D.   On: 12/08/2014 17:43   12/08/2014   CLINICAL DATA:  Left lower extremity weakness and slurred speech  EXAM: CT HEAD WITHOUT CONTRAST  TECHNIQUE: Contiguous axial images were obtained from the base of the skull through the vertex without intravenous contrast.  COMPARISON:  11/08/2014  FINDINGS: The brainstem, cerebellum, cerebral peduncles, thalamus, basal ganglia, basilar cisterns, and ventricular system appear within normal limits. No intracranial hemorrhage, mass lesion, or acute CVA.  IMPRESSION: No significant abnormality identified.  Electronically Signed: By: Van Clines M.D. On: 12/08/2014 17:27   Mr Jodene Nam Head Wo Contrast  12/08/2014   CLINICAL DATA:  Worsening left leg weakness. Slurred speech and blurred vision for 2 weeks. Prior stroke with left-sided deficit. New onset altered gait. History of hypertension, diabetes, and hypercholesterolemia.  EXAM: MRI HEAD WITHOUT CONTRAST  MRA HEAD WITHOUT CONTRAST  TECHNIQUE: Multiplanar, multiecho pulse sequences of the brain and surrounding structures were obtained without intravenous contrast. Angiographic images of the head were obtained using MRA technique without contrast.  COMPARISON:  Head CT 12/08/2014,  MRI 11/08/2014, and MRA 11/03/2014  FINDINGS: MRI HEAD FINDINGS  Some sequences are mildly to moderately degraded by motion artifact.  A partially empty sella is again noted. There is no evidence of acute infarct, intracranial hemorrhage, mass, midline shift, or extra-axial fluid collection. Small foci of T2 hyperintensity in the white matter of the parietal lobes are unchanged from the prior MRI and nonspecific but compatible with very mild chronic small vessel ischemic disease. Ventricles and sulci are normal.  Orbits are unremarkable. Paranasal sinuses and mastoid air cells are clear. Major intracranial vascular flow voids are preserved.  MRA HEAD FINDINGS  Images are mildly to moderately degraded by motion artifact.  Visualized  distal vertebral arteries are patent and tortuous without evidence of significant stenosis. PICA and SCA origins are patent. Basilar artery is patent with motion artifact proximally but no evidence of significant stenosis. Posterior communicating arteries are not clearly identified. PCAs are patent without evidence of significant stenosis.  Visualized distal cervical ICA on the left is tortuous. Intracranial ICAs are patent without evidence of significant stenosis. ACAs and MCAs are patent without evidence of stenosis. No intracranial aneurysm is identified.  IMPRESSION: 1. No acute intracranial abnormality. 2. Very mild chronic small vessel ischemic disease in the cerebral white matter. 3. Negative head MRA allowing for motion artifact.   Electronically Signed   By: Logan Bores   On: 12/08/2014 19:46   Mr Brain Wo Contrast  12/08/2014   CLINICAL DATA:  Worsening left leg weakness. Slurred speech and blurred vision for 2 weeks. Prior stroke with left-sided deficit. New onset altered gait. History of hypertension, diabetes, and hypercholesterolemia.  EXAM: MRI HEAD WITHOUT CONTRAST  MRA HEAD WITHOUT CONTRAST  TECHNIQUE: Multiplanar, multiecho pulse sequences of the brain and surrounding structures were obtained without intravenous contrast. Angiographic images of the head were obtained using MRA technique without contrast.  COMPARISON:  Head CT 12/08/2014, MRI 11/08/2014, and MRA 11/03/2014  FINDINGS: MRI HEAD FINDINGS  Some sequences are mildly to moderately degraded by motion artifact.  A partially empty sella is again noted. There is no evidence of acute infarct, intracranial hemorrhage, mass, midline shift, or extra-axial fluid collection. Small foci of T2 hyperintensity in the white matter of the parietal lobes are unchanged from the prior MRI and nonspecific but compatible with very mild chronic small vessel ischemic disease. Ventricles and sulci are normal.  Orbits are unremarkable. Paranasal sinuses and  mastoid air cells are clear. Major intracranial vascular flow voids are preserved.  MRA HEAD FINDINGS  Images are mildly to moderately degraded by motion artifact.  Visualized distal vertebral arteries are patent and tortuous without evidence of significant stenosis. PICA and SCA origins are patent. Basilar artery is patent with motion artifact proximally but no evidence of significant stenosis. Posterior communicating arteries are not clearly identified. PCAs are patent without evidence of significant stenosis.  Visualized distal cervical ICA on the left is tortuous. Intracranial ICAs are patent without evidence of significant stenosis. ACAs and MCAs are patent without evidence of stenosis. No intracranial aneurysm is identified.  IMPRESSION: 1. No acute intracranial abnormality. 2. Very mild chronic small vessel ischemic disease in the cerebral white matter. 3. Negative head MRA allowing for motion artifact.   Electronically Signed   By: Logan Bores   On: 12/08/2014 19:46    Assessment & Plan by Problem: Active Problems:   Acute ischemic stroke  Left sided weakness-- CT head neg. MRI negative other than very mild chronic small vessel ischemic disease in  the cerebral white matter. Previous admission to [facility] where she was diagnosed w/ probably conversion d/o after MRI was negative for CVA when she presented with left sided weakness.  ECHO EF 60% and bubble study negative.  - admit to tele - asa 81mg  daily - neuro checks - PT/OT SLP - psych consulted - EKG - trop  AKI-- Creatinine 2.16 on admission, b/l around 0.8. Recent contrast w CT abd/pelvis on 6/9. She is on celebrex 100mg  BID and indomethacin 25 mg TID prn. Also on lasix 40mg  daily and endorses decreased po intake x 3 days. Given NS 2L bolus in the ED.  - NS w/ 40 KCl meq at 75cc/hr - BMET in the am - orthostatic vitals  HTN-- on atenolol 100mg  daily, lasix 40mg  daily, HCTZ 25mg  daily, and hydralazine 25mg  TID daily at  home - cont atenolol 100 mg daily  Diabetes-- HbA1c on 6.8 5/21 on glipizide 10mg  daily, metformin 1000mg  BID  - SSI- mod  HLD--- LDL 125 on 5/24 - cont home lipitor 40mg   Depression, bipolar d/o, and anxiety-- on trileptal 600mg  BID, seroquel 600mg  daily, celexa 20mg , and hydroxyzine 25mg  q6h prn daily at home - cont home meds - psych consulted  Asthma-- cont home albuterol nebulizer  GERD-- on dexilant and prilosec at home - protonix 20mg  daily  Hx of EtOH abuse- use to drink 4-5 40 ounces beers per day, last drink was 07/2014 - CIWA - MVI, thiamine, folate  Hx of suicide attempt--via OD on seroquel and trileptal, as well as cutting her wrists in the past.  - suicide precautions  DVT ppx-- hep [place]  FEN - hypokalemia, potassium 2.9 on admission. repleted with Kdur 31meq BID and IVF as above - HH carb mod diet   Dispo: Disposition is deferred at this time, awaiting improvement of current medical problems.   The patient does have a current PCP Elbert Ewings, FNP) and does not need an [facility] hospital follow-up appointment after discharge.  The patient does not have transportation limitations that hinder transportation to clinic appointments.  Signed: Norman Herrlich, MD 12/08/2014, 7:13 PM

## 2014-12-08 NOTE — Consult Note (Signed)
Referring Physician: Dr Kathrynn Humble    Chief Complaint: code stroke, worsening left LE weakness, inability to ambulate  HPI:                                                                                                                                         Maureen Swanson is an 49 y.o. female with a past medical history significant for bipolar disorder, HTN, hypercholesterolemia, DM, cocaine use, migraine, brought in as a code stroke by EMS due to the above mentioned symptoms. Patient lives at home and reports that she has been having slurred speech and blurred vision for the past 2 weeks. She reportedly had a stroke last May which resulted on residual  left sided weakness (can not find such documentation in her hospital records), but today around 1 pm the left leg got weaker and she had to hold onto the walk when trying to walk. She said that her slurred speech was " different, on and off".  Complains of dizziness but denies HA, double vision, numbness, increased weakness left UE or weakness in the right side. NIHSS 6 CT brain was personally reviewed and showed no acute abnormality.  Date last known well: 12/08/14 Time last known well: 1 pm tPA Given:  No, out of the window NIHSS: 6   Past Medical History  Diagnosis Date  . High cholesterol   . Depression   . Gout   . Anxiety   . Bipolar 1 disorder   . GERD (gastroesophageal reflux disease)   . Substance abuse     crack cocaine  . Headache(784.0)     migraines  . TMJ (temporomandibular joint disorder)   . Asthma     daily and prn inhalers  . Arthritis     back and knees  . Gastric ulcer   . 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 Hypertension     under control, has been on med. x 12 yrs.  . Diabetes mellitus     diet-controlled    Past Surgical History  Procedure Laterality Date  . Knee arthroscopy w/ partial medial meniscectomy  05/01/2010    right  . Breast lumpectomy with needle localization   04/19/2012    Procedure: BREAST LUMPECTOMY WITH NEEDLE LOCALIZATION;  Surgeon: [name], MD;  Location: [place];  Service: General;  Laterality: Right;    Family History  Problem Relation Age of Onset  . Diabetes Mother   . Cancer Mother     breast  . Cancer Father    Social History:  reports that she quit smoking about 2 years ago. She has never used smokeless tobacco. She reports that she does not drink alcohol or use illicit drugs. Family history: no brain tumors, epilepsy, or brain aneurysms. Allergies:  Allergies  Allergen Reactions  . Chocolate Hives  . Orange Hives    "  Acid foods"  . Penicillins Hives  . Tomato Hives    "acid foods"    Medications:                                                                                                                           I have reviewed the patient's current medications.  ROS:                                                                                                                                       History obtained from chart review and the patient  General ROS: negative for - chills, fatigue, fever, night sweats, or weight loss Psychological ROS: negative for - memory difficulties, or suicidal ideation Ophthalmic ROS: negative for - double vision, eye pain or loss of vision ENT ROS: negative for - epistaxis, nasal discharge, oral lesions, sore throat, tinnitus or vertigo Allergy and Immunology ROS: negative for - hives or itchy/watery eyes Hematological and Lymphatic ROS: negative for - bleeding problems, bruising or swollen lymph nodes Endocrine ROS: negative for - galactorrhea, hair pattern changes, polydipsia/polyuria or temperature intolerance Respiratory ROS: negative for - cough, hemoptysis, shortness of breath or wheezing Cardiovascular ROS: negative for - chest pain, dyspnea on exertion, edema or irregular heartbeat Gastrointestinal ROS: negative for - abdominal pain, diarrhea,  hematemesis, nausea/vomiting or stool incontinence Genito-Urinary ROS: negative for - dysuria, hematuria, incontinence or urinary frequency/urgency Musculoskeletal ROS: negative for - joint swelling or muscular Neurological ROS: as noted in HPI Dermatological ROS: negative for rash and skin lesion changes    Physical exam: pleasant female in no apparent distress. Blood pressure 132/79, pulse 61, temperature 98.9 F (37.2 C), temperature source Oral, resp. rate 19, SpO2 95 %. Head: normocephalic. Neck: supple, no bruits, no JVD. Cardiac: no murmurs. Lungs: clear. Abdomen: soft, no tender, no mass. Extremities: no edema. Skin: no rash  Neurologic Examination:  General: Mental Status: Alert, oriented, thought content appropriate.  Mild dysarthria without evidence of aphasia.  Able to follow 3 step commands without difficulty. Cranial Nerves: II: Discs flat bilaterally; Visual fields with questionable left upper field cut, pupils equal, round, reactive to light and accommodation III,IV, VI: ptosis not present, extra-ocular motions intact bilaterally V,VII: smile asymmetric due to subtle left face weakness, facial light touch sensation normal bilaterally VIII: hearing normal bilaterally IX,X: uvula rises symmetrically XI: bilateral shoulder shrug XII: midline tongue extension without atrophy or fasciculations Motor: Right : Upper extremity   5/5    Left:     Upper extremity   5/5  Lower extremity   5/5     Lower extremity   5/5 Tone and bulk:normal tone throughout; no atrophy noted Sensory: Pinprick and light touch diminished in the left Deep Tendon Reflexes:  Right: Upper Extremity   Left: Upper extremity   biceps (C-5 to C-6) 2/4   biceps (C-5 to C-6) 2/4 tricep (C7) 2/4    triceps (C7) 2/4 Brachioradialis (C6) 2/4  Brachioradialis (C6) 2/4  Lower Extremity Lower Extremity   quadriceps (L-2 to L-4) 2/4   quadriceps (L-2 to L-4) 2/4 Achilles (S1) 2/4   Achilles (S1) 2/4  Plantars: Right: downgoing   Left: downgoing Cerebellar: normal finger-to-nose,  normal heel-to-shin test Gait:  No tested due to multiple leads. CV: pulses palpable throughout   Results for orders placed or performed during the hospital encounter of 12/08/14 (from the past 48 hour(s))  CBC     Status: Abnormal   Collection Time: 12/08/14  5:21 PM  Result Value Ref Range   WBC 7.4 4.0 - 10.5 K/uL   RBC 3.75 (L) 3.87 - 5.11 MIL/uL   Hemoglobin 10.3 (L) 12.0 - 15.0 g/dL   HCT 31.4 (L) 36.0 - 46.0 %   MCV 83.7 78.0 - 100.0 fL   MCH 27.5 26.0 - 34.0 pg   MCHC 32.8 30.0 - 36.0 g/dL   RDW 13.0 11.5 - 15.5 %   Platelets 266 150 - 400 K/uL  Differential     Status: None   Collection Time: 12/08/14  5:21 PM  Result Value Ref Range   Neutrophils Relative % 70 43 - 77 %   Neutro Abs 5.2 1.7 - 7.7 K/uL   Lymphocytes Relative 23 12 - 46 %   Lymphs Abs 1.7 0.7 - 4.0 K/uL   Monocytes Relative 6 3 - 12 %   Monocytes Absolute 0.5 0.1 - 1.0 K/uL   Eosinophils Relative 1 0 - 5 %   Eosinophils Absolute 0.0 0.0 - 0.7 K/uL   Basophils Relative 0 0 - 1 %   Basophils Absolute 0.0 0.0 - 0.1 K/uL  I-stat troponin, ED (not at [facility], [facility])     Status: None   Collection Time: 12/08/14  5:32 PM  Result Value Ref Range   Troponin i, poc 0.00 0.00 - 0.08 ng/mL   Comment 3            Comment: Due to the release kinetics of cTnI, a negative result within the first hours of the onset of symptoms does not rule out myocardial infarction with certainty. If myocardial infarction is still suspected, repeat the test at appropriate intervals.   I-Stat Chem 8, ED  (not at [facility], [facility])     Status: Abnormal   Collection Time: 12/08/14  5:35 PM  Result Value Ref Range   Sodium 136 135 - 145 mmol/L   Potassium 2.8 (  L) 3.5 - 5.1 mmol/L   Chloride 93 (L) 101 - 111 mmol/L   BUN 38 (H) 6 - 20 mg/dL   Creatinine, Ser  2.20 (H) 0.44 - 1.00 mg/dL   Glucose, Bld 132 (H) 65 - 99 mg/dL   Calcium, Ion 1.10 (L) 1.12 - 1.23 mmol/L   TCO2 28 0 - 100 mmol/L   Hemoglobin 11.6 (L) 12.0 - 15.0 g/dL   HCT 34.0 (L) 36.0 - 46.0 %  CBG monitoring, ED     Status: Abnormal   Collection Time: 12/08/14  5:35 PM  Result Value Ref Range   Glucose-Capillary 111 (H) 65 - 99 mg/dL   Ct Head Wo Contrast  12/08/2014   ADDENDUM REPORT: 12/08/2014 17:43  ADDENDUM: The original report was by Dr. Van Clines. The following addendum is by Dr. Van Clines:  These results were called by telephone at the time of interpretation on 12/08/2014 at 5:35pm to Dr. Armida Sans, who verbally acknowledged these results.   Electronically Signed   By: Van Clines M.D.   On: 12/08/2014 17:43   12/08/2014   CLINICAL DATA:  Left lower extremity weakness and slurred speech  EXAM: CT HEAD WITHOUT CONTRAST  TECHNIQUE: Contiguous axial images were obtained from the base of the skull through the vertex without intravenous contrast.  COMPARISON:  11/08/2014  FINDINGS: The brainstem, cerebellum, cerebral peduncles, thalamus, basal ganglia, basilar cisterns, and ventricular system appear within normal limits. No intracranial hemorrhage, mass lesion, or acute CVA.  IMPRESSION: No significant abnormality identified.  Electronically Signed: By: Van Clines M.D. On: 12/08/2014 17:27     Assessment: 49 y.o. female with a past medical history significant for bipolar disorder, HTN, hypercholesterolemia, DM, cocaine use, migraine, brought in as a code stroke due to worsening left leg weakness. She describes slurred speech and blurred vision for 2 weeks. NIHSS 6, although some inconsistencies on exam. CT brain without acute abnormality. She is out of the window for IV thrombolysis and doubt large artery occlusion. Interestingly enough, she indicated that she had a stroke last May and was seen at [facility] but I couldn't locate medical records confirming that  assertion. Further, CT brain today disclosed no evidence of prior stroke. Admit to medicine. Stroke work up. Stroke team will follow up tomorrow.  Stroke Risk Factors -  HTN, hypercholesterolemia, DM, cocaine use, obesity  Plan: 1. HgbA1c, fasting lipid panel 2. MRI, MRA  of the brain without contrast 3. Echocardiogram 4. Carotid dopplers 5. Prophylactic therapy-aspirin 81 mg daily 6. Risk factor modification 7. Telemetry monitoring 8. Frequent neuro checks 9. PT/OT SLP  Dorian Pod, MD [facility] [phone]  12/08/2014, 5:55 PM

## 2014-12-08 NOTE — ED Provider Notes (Signed)
CSN: [id number]     Arrival date & time 12/08/14  1715 History   First MD Initiated Contact with Patient 12/08/14 1750     Chief Complaint  Patient presents with  . Code Stroke    @EDPCLEARED @ (Consider location/radiation/quality/duration/timing/severity/associated sxs/prior Treatment) HPI Comments: Maureen Swanson is a 49 y/o female with a history of diabetes, hypertension, hyperlipidemia and cocaine abuse who presents to the ED with stroke-like symptoms.  Patient had onset of mildly slurred speech one week ago which has remained relatively unchanged; today at approx. 1 pm patient was walking around her house when she felt her left leg go "weak and numb," causing patient to have unsteady gait and sit down.  Left leg symptoms and slurred speech remained unchanged for a few hours, so patient called EMS.  Patient started lipitor this week but otherwise has no new medications.  States she has not used crack cocaine in 5 months.  Experienced mild chest pain earlier in week but none today.  No fever, sweats, chills reported. No known hx of strokes.   ROS 10 Systems reviewed and are negative for acute change except as noted in the HPI.     The history is provided by the patient.    Past Medical History  Diagnosis Date  . High cholesterol   . Depression   . Gout   . Anxiety   . Bipolar 1 disorder   . GERD (gastroesophageal reflux disease)   . Substance abuse     crack cocaine  . Headache(784.0)     migraines  . TMJ (temporomandibular joint disorder)   . Asthma     daily and prn inhalers  . Arthritis     back and knees  . Gastric ulcer   . 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 Hypertension     under control, has been on med. x 12 yrs.  . Diabetes mellitus     diet-controlled   Past Surgical History  Procedure Laterality Date  . Knee arthroscopy w/ partial medial meniscectomy  05/01/2010    right  . Breast lumpectomy with needle localization  04/19/2012     Procedure: BREAST LUMPECTOMY WITH NEEDLE LOCALIZATION;  Surgeon: [name], MD;  Location: [place];  Service: General;  Laterality: Right;   Family History  Problem Relation Age of Onset  . Diabetes Mother   . Cancer Mother     breast  . Cancer Father    History  Substance Use Topics  . Smoking status: Former Smoker -- 15 years    Quit date: 08/02/2012  . Smokeless tobacco: Never Used     Comment: quit smoking 02/2012  . Alcohol Use: No   OB History    No data available     Review of Systems  Neurological: Positive for speech difficulty, weakness and numbness.  All other systems reviewed and are negative.     Allergies  Chocolate; Orange; Penicillins; and Tomato  Home Medications   Prior to Admission medications   Medication Sig Start Date End Date Taking? Authorizing Provider  albuterol (PROVENTIL HFA;VENTOLIN HFA) 108 (90 BASE) MCG/ACT inhaler Inhale 2 puffs into the lungs every 6 (six) hours as needed for wheezing or shortness of breath. 03/02/14  Yes Encarnacion Slates, NP  aspirin 81 MG tablet Take 81 mg by mouth daily.   Yes Historical Provider, MD  atenolol (TENORMIN) 100 MG tablet Take 1 tablet (100 mg total) by mouth daily.  For hypertension 03/02/14  Yes Encarnacion Slates, NP  atorvastatin (LIPITOR) 40 MG tablet Take 40 mg by mouth daily.   Yes Historical Provider, MD  baclofen (LIORESAL) 10 MG tablet Take 10 mg by mouth 2 (two) times daily.   Yes Historical Provider, MD  celecoxib (CELEBREX) 100 MG capsule Take 1 capsule (100 mg total) by mouth 2 (two) times daily. For pain/arthritis 08/02/14  Yes Shuvon B Rankin, NP  cetirizine (ZYRTEC) 10 MG tablet Take 10 mg by mouth daily.   Yes Historical Provider, MD  Dexlansoprazole (DEXILANT) 30 MG capsule Take 30 mg by mouth daily.   Yes Historical Provider, MD  furosemide (LASIX) 40 MG tablet Take 40 mg by mouth daily.   Yes Historical Provider, MD  gabapentin (NEURONTIN) 400 MG capsule Take 400 mg by mouth 2  (two) times daily.   Yes Historical Provider, MD  glipiZIDE (GLUCOTROL) 10 MG tablet Take 10 mg by mouth daily before breakfast.   Yes Historical Provider, MD  hydrochlorothiazide (HYDRODIURIL) 25 MG tablet Take 1 tablet (25 mg total) by mouth daily. For hypertension 03/02/14  Yes Encarnacion Slates, NP  HYDROcodone-acetaminophen (NORCO/VICODIN) 5-325 MG per tablet Take 1-2 tablets by mouth every 6 (six) hours as needed. 11/23/14  Yes Wandra Arthurs, MD  hydrOXYzine (ATARAX/VISTARIL) 25 MG tablet Take 1 tablet (25 mg total) by mouth every 6 (six) hours as needed for anxiety. 08/02/14  Yes Shuvon B Rankin, NP  hydrOXYzine (ATARAX/VISTARIL) 25 MG tablet Take 25 mg by mouth daily as needed for itching.   Yes Historical Provider, MD  indomethacin (INDOCIN) 25 MG capsule Take 25 mg by mouth 3 (three) times daily as needed for mild pain.   Yes Historical Provider, MD  metFORMIN (GLUCOPHAGE) 1000 MG tablet Take 1,000 mg by mouth 2 (two) times daily with a meal.   Yes Historical Provider, MD  metoCLOPramide (REGLAN) 10 MG tablet Take 10 mg by mouth 4 (four) times daily.   Yes Historical Provider, MD  naltrexone (DEPADE) 50 MG tablet Take 50 mg by mouth daily.   Yes Historical Provider, MD  omeprazole (PRILOSEC) 20 MG capsule Take 1 capsule (20 mg total) by mouth daily. For GERD 08/02/14  Yes Shuvon B Rankin, NP  ondansetron (ZOFRAN ODT) 4 MG disintegrating tablet 4mg  ODT q4 hours prn nausea/vomit 11/23/14  Yes Wandra Arthurs, MD  oxcarbazepine (TRILEPTAL) 600 MG tablet Take 1 tablet (600 mg total) by mouth 2 (two) times daily. For mood stabilization Patient taking differently: Take 600 mg by mouth every evening. For mood stabilization 08/02/14  Yes Shuvon B Rankin, NP  Oyster Shell Calcium (OYSCO 500) 500 MG TABS Take 1 tablet by mouth daily.   Yes Historical Provider, MD  potassium chloride SA (K-DUR,KLOR-CON) 20 MEQ tablet Take 1 tablet (20 mEq total) by mouth daily. 11/23/14  Yes Wandra Arthurs, MD  QUEtiapine (SEROQUEL XR) 300  MG 24 hr tablet Take 2 tablets (600 mg total) by mouth daily at 8 pm. For depression and mood stabilization 08/02/14  Yes Shuvon B Rankin, NP  ciprofloxacin (CIPRO) 500 MG tablet Take 1 tablet (500 mg total) by mouth 2 (two) times daily. One po bid x 7 days Patient not taking: Reported on 12/08/2014 11/23/14   Wandra Arthurs, MD  citalopram (CELEXA) 20 MG tablet Take 1 tablet (20 mg total) by mouth daily. For depression Patient not taking: Reported on 11/23/2014 08/02/14   Shuvon B Rankin, NP  hydrALAZINE (APRESOLINE) 25 MG tablet Take 1  tablet (25 mg total) by mouth 3 (three) times daily. Patient not taking: Reported on 11/23/2014 10/13/14   Charlesetta Shanks, MD  loratadine (CLARITIN) 10 MG tablet Take 1 tablet (10 mg total) by mouth daily. (May be purchased from over the counter): For allergies Patient not taking: Reported on 07/02/2014 03/02/14   Encarnacion Slates, NP  metFORMIN (GLUCOPHAGE) 500 MG tablet Take 1 tablet (500 mg total) by mouth every evening. For Type 2 Diabetes Patient not taking: Reported on 11/23/2014 08/02/14   Shuvon B Rankin, NP  nicotine (NICODERM CQ - DOSED IN MG/24 HOURS) 21 mg/24hr patch Place 1 patch (21 mg total) onto the skin daily. For smoking cessation Patient not taking: Reported on 11/23/2014 08/02/14   Shuvon B Rankin, NP  omeprazole (PRILOSEC) 40 MG capsule Take 40 mg by mouth daily.    Historical Provider, MD  pantoprazole (PROTONIX) 20 MG tablet Take 1 tablet (20 mg total) by mouth daily. Patient not taking: Reported on 11/23/2014 09/14/14   Comer Locket, PA-C  polyethylene glycol powder (GLYCOLAX/MIRALAX) powder Take 17 g by mouth 2 (two) times daily. Until daily soft stools  OTC Patient not taking: Reported on 11/23/2014 09/14/14   Comer Locket, PA-C  ranitidine (ZANTAC) 150 MG tablet Take 1 tablet (150 mg total) by mouth 2 (two) times daily. Patient not taking: Reported on 11/23/2014 09/14/14   Comer Locket, PA-C   BP 126/87 mmHg  Pulse 63  Temp(Src) 98.9 F (37.2 C)  (Oral)  Resp 24  SpO2 100%  LMP  (Approximate) Physical Exam  Constitutional: She is oriented to person, place, and time. She appears well-developed and well-nourished.  HENT:  Head: Normocephalic and atraumatic.  Eyes: EOM are normal. Pupils are equal, round, and reactive to light.  Neck: Neck supple.  Cardiovascular: Normal rate, regular rhythm, normal heart sounds and intact distal pulses.   Pulmonary/Chest: Effort normal. No respiratory distress.  Neurological: She is alert and oriented to person, place, and time.  Level of Consciousness (1a. ) : Alert, keenly responsive (06/24 1745) LOC Questions (1b. ) : Answers one question correctly (06/24 1745) LOC Commands (1c. ) : Performs both tasks correctly (06/24 1745) Best Gaze (2. ): Normal (06/24 1745) Visual (3. ): L Partial hemianopia (06/24 1745) Facial Palsy (4. ) :L Minor paralysis (06/24 1745) Motor Arm, Left (5a. ): No drift (06/24 1745) Motor Arm, Right (5b. ) No drift (06/24 1745) Motor Leg, Left (6a. ) : + Drift (06/24 1745) Motor Leg, Right (6b. ): No drift (06/24 1745) Limb Ataxia (7. ): Absent (06/24 1745) Sensory (8. ): Mild-to-moderate sensory loss, patient feels pinprick is less sharp or is dull on the affected side, or there is a loss of superficial pain with pinprick, but patient is aware of being touched (06/24 1745) Best Language (9. ): No aphasia (06/24 1745) Dysarthria (10. ): Mild-to-moderate dysarthria, patient slurs at least some words and, at worst, can be understood with some difficulty (06/24 1745) Inattention/Extinction: Visual/tactile/auditory/spatial/personal inattention (06/24 1745) Total: 7 (06/24 1745)   Skin: Skin is warm and dry.  Nursing note and vitals reviewed.   ED Course  Procedures (including critical care time) Labs Review Labs Reviewed  CBC - Abnormal; Notable for the following:    RBC 3.75 (*)    Hemoglobin 10.3 (*)    HCT 31.4 (*)    All other components within normal limits   COMPREHENSIVE METABOLIC PANEL - Abnormal; Notable for the following:    Potassium 2.9 (*)  Chloride 93 (*)    Glucose, Bld 130 (*)    BUN 38 (*)    Creatinine, Ser 2.16 (*)    Total Bilirubin 0.2 (*)    GFR calc non Af Amer 26 (*)    GFR calc Af Amer 30 (*)    All other components within normal limits  I-STAT CHEM 8, ED - Abnormal; Notable for the following:    Potassium 2.8 (*)    Chloride 93 (*)    BUN 38 (*)    Creatinine, Ser 2.20 (*)    Glucose, Bld 132 (*)    Calcium, Ion 1.10 (*)    Hemoglobin 11.6 (*)    HCT 34.0 (*)    All other components within normal limits  CBG MONITORING, ED - Abnormal; Notable for the following:    Glucose-Capillary 111 (*)    All other components within normal limits  ETHANOL  PROTIME-INR  APTT  DIFFERENTIAL  URINE RAPID DRUG SCREEN, HOSP PERFORMED  URINALYSIS, ROUTINE W REFLEX MICROSCOPIC (NOT AT [facility])  Randolm Idol, ED    Imaging Review Ct Head Wo Contrast  12/08/2014   ADDENDUM REPORT: 12/08/2014 17:43  ADDENDUM: The original report was by Dr. Van Clines. The following addendum is by Dr. Van Clines:  These results were called by telephone at the time of interpretation on 12/08/2014 at 5:35pm to Dr. Armida Sans, who verbally acknowledged these results.   Electronically Signed   By: Van Clines M.D.   On: 12/08/2014 17:43   12/08/2014   CLINICAL DATA:  Left lower extremity weakness and slurred speech  EXAM: CT HEAD WITHOUT CONTRAST  TECHNIQUE: Contiguous axial images were obtained from the base of the skull through the vertex without intravenous contrast.  COMPARISON:  11/08/2014  FINDINGS: The brainstem, cerebellum, cerebral peduncles, thalamus, basal ganglia, basilar cisterns, and ventricular system appear within normal limits. No intracranial hemorrhage, mass lesion, or acute CVA.  IMPRESSION: No significant abnormality identified.  Electronically Signed: By: Van Clines M.D. On: 12/08/2014 17:27     EKG  Interpretation None      MDM   Final diagnoses:  Acute ischemic stroke  Acute left-sided weakness  Slurred speech  AKI (acute kidney injury)    Pt comes in with L sided weakness and slurred speech.  DDx includes:  Stroke - ischemic vs. hemorrhagic TIA Neuropathy/nerve impingement Myelitis Electrolyte abnormality Muscular disease  PT with hx of DM, HTN, HL and cocaine abuse comes in with neurologic complains. Concerns for stroke. Last normal - last week and outside of TPA window. LLE weakness is new. Stroke team at bedside, as pt was encoded as an acute stroke. NIHSS 7. Will admit for further workup.    Varney Biles, MD 12/08/14 1902

## 2014-12-08 NOTE — ED Notes (Signed)
Per EMS, Patient is coming from home. Pt was last seen normal at 1300. PT states she started to have left leg weakness that was progressively worse than before with last stroke. Patient reports slurred speech and blurred vision for two weeks. Patient called PCP to make appointment about left leg weakness when PCP called EMS to have patient brought to the hospital. Patient has history of stroke in May. Patient has left sided deficit due to past stroke. Patient reports new onset of altered gait with left sided weakness. Vitals per EMS: 104/60, 66 HR, 18 RR, 98% on 2L, 154 CBG.

## 2014-12-09 DIAGNOSIS — E876 Hypokalemia: Secondary | ICD-10-CM

## 2014-12-09 DIAGNOSIS — Z915 Personal history of self-harm: Secondary | ICD-10-CM

## 2014-12-09 DIAGNOSIS — F251 Schizoaffective disorder, depressive type: Secondary | ICD-10-CM | POA: Diagnosis not present

## 2014-12-09 DIAGNOSIS — N179 Acute kidney failure, unspecified: Secondary | ICD-10-CM

## 2014-12-09 DIAGNOSIS — J45909 Unspecified asthma, uncomplicated: Secondary | ICD-10-CM

## 2014-12-09 DIAGNOSIS — K219 Gastro-esophageal reflux disease without esophagitis: Secondary | ICD-10-CM

## 2014-12-09 DIAGNOSIS — F418 Other specified anxiety disorders: Secondary | ICD-10-CM

## 2014-12-09 DIAGNOSIS — F1910 Other psychoactive substance abuse, uncomplicated: Secondary | ICD-10-CM | POA: Diagnosis not present

## 2014-12-09 DIAGNOSIS — Z79899 Other long term (current) drug therapy: Secondary | ICD-10-CM

## 2014-12-09 DIAGNOSIS — Z7951 Long term (current) use of inhaled steroids: Secondary | ICD-10-CM

## 2014-12-09 DIAGNOSIS — E785 Hyperlipidemia, unspecified: Secondary | ICD-10-CM

## 2014-12-09 DIAGNOSIS — F209 Schizophrenia, unspecified: Secondary | ICD-10-CM

## 2014-12-09 DIAGNOSIS — E119 Type 2 diabetes mellitus without complications: Secondary | ICD-10-CM

## 2014-12-09 DIAGNOSIS — F449 Dissociative and conversion disorder, unspecified: Secondary | ICD-10-CM

## 2014-12-09 DIAGNOSIS — R42 Dizziness and giddiness: Secondary | ICD-10-CM

## 2014-12-09 DIAGNOSIS — F1021 Alcohol dependence, in remission: Secondary | ICD-10-CM

## 2014-12-09 DIAGNOSIS — M6281 Muscle weakness (generalized): Secondary | ICD-10-CM

## 2014-12-09 DIAGNOSIS — I10 Essential (primary) hypertension: Secondary | ICD-10-CM

## 2014-12-09 DIAGNOSIS — H538 Other visual disturbances: Secondary | ICD-10-CM

## 2014-12-09 LAB — BASIC METABOLIC PANEL
ANION GAP: 7 (ref 5–15)
BUN: 29 mg/dL — AB (ref 6–20)
CO2: 34 mmol/L — ABNORMAL HIGH (ref 22–32)
CREATININE: 1.27 mg/dL — AB (ref 0.44–1.00)
Calcium: 9.1 mg/dL (ref 8.9–10.3)
Chloride: 98 mmol/L — ABNORMAL LOW (ref 101–111)
GFR calc Af Amer: 56 mL/min — ABNORMAL LOW (ref >60)
GFR calc non Af Amer: 49 mL/min — ABNORMAL LOW (ref >60)
GLUCOSE: 140 mg/dL — AB (ref 65–99)
Potassium: 3.3 mmol/L — ABNORMAL LOW (ref 3.5–5.1)
Sodium: 139 mmol/L (ref 135–145)

## 2014-12-09 LAB — GLUCOSE, CAPILLARY
GLUCOSE-CAPILLARY: 132 mg/dL — AB (ref 65–99)
GLUCOSE-CAPILLARY: 155 mg/dL — AB (ref 65–99)
Glucose-Capillary: 188 mg/dL — ABNORMAL HIGH (ref 65–99)
Glucose-Capillary: 169 mg/dL — ABNORMAL HIGH (ref 65–99)

## 2014-12-09 LAB — MAGNESIUM: MAGNESIUM: 1.4 mg/dL — AB (ref 1.7–2.4)

## 2014-12-09 LAB — TROPONIN I

## 2014-12-09 LAB — CARBAMAZEPINE LEVEL, TOTAL: Carbamazepine Lvl: <2 ug/mL — ABNORMAL LOW (ref 4.0–12.0)

## 2014-12-09 MED ORDER — CETYLPYRIDINIUM CHLORIDE 0.05 % MT LIQD: 7.0000 mL | Freq: Two times a day (BID) | OROMUCOSAL | Status: DC

## 2014-12-09 MED ORDER — PANTOPRAZOLE SODIUM 40 MG PO TBEC
40.0000 mg | DELAYED_RELEASE_TABLET | Freq: Every day | ORAL | Status: DC
  Administered 2014-12-10 – 2014-12-11 (×2): 40 mg via ORAL
  Filled 2014-12-09 (×2): qty 1

## 2014-12-09 MED ORDER — ACETAMINOPHEN 325 MG PO TABS
650.0000 mg | ORAL_TABLET | Freq: Four times a day (QID) | ORAL | Status: DC | PRN
  Administered 2014-12-09 – 2014-12-10 (×2): 650 mg via ORAL
  Filled 2014-12-09: qty 2

## 2014-12-09 MED ORDER — QUETIAPINE FUMARATE ER 200 MG PO TB24
400.0000 mg | ORAL_TABLET | Freq: Every day | ORAL | Status: DC
  Administered 2014-12-09 – 2014-12-10 (×2): 400 mg via ORAL
  Filled 2014-12-09 (×2): qty 2

## 2014-12-09 MED ORDER — MAGNESIUM SULFATE 2 GM/50ML IV SOLN
2.0000 g | Freq: Once | INTRAVENOUS | Status: AC
  Administered 2014-12-09: 2 g via INTRAVENOUS
  Filled 2014-12-09: qty 50

## 2014-12-09 NOTE — Progress Notes (Signed)
STROKE TEAM PROGRESS NOTE   HISTORY Maureen Swanson is a 49 y.o. female with a past medical history significant for bipolar disorder, HTN, hypercholesterolemia, DM, cocaine use, and migraines, brought in as a code stroke by EMS due to worsening left lower extremity weakness with inability to ambulate. Patient lives at home and reports that she has been having slurred speech and blurred vision for the past 2 weeks. 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 She said that her slurred speech was " different, on and off". Complains of dizziness but denies HA, double vision, numbness, increased weakness left UE or weakness in the right side. NIHSS 6 CT brain was personally reviewed and showed no acute abnormality.  Date last known well: 12/08/14 Time last known well: 1 pm tPA Given: No, out of the window NIHSS: 6   SUBJECTIVE (INTERVAL HISTORY) No one is at bedside. Patient says she is in pain in her left leg.   OBJECTIVE Temp:  [98.2 F (36.8 C)-99 F (37.2 C)] 98.2 F (36.8 C) (06/25 1026) Pulse Rate:  [56-92] 70 (06/25 1026) Cardiac Rhythm:  [-] Normal sinus rhythm (06/25 0500) Resp:  [14-26] 18 (06/25 1026) BP: (100-133)/(66-87) 105/67 mmHg (06/25 1026) SpO2:  [95 %-100 %] 96 % (06/25 1026) Weight:  [104.8 kg (231 lb 0.7 oz)] 104.8 kg (231 lb 0.7 oz) (06/24 2041)   Recent Labs Lab 12/08/14 1735 12/09/14 0634 12/09/14 1124  GLUCAP 111* 132* 188*    Recent Labs Lab 12/08/14 1721 12/08/14 1735 12/08/14 2354 12/09/14 0530  NA 136 136  --  139  K 2.9* 2.8*  --  3.3*  CL 93* 93*  --  98*  CO2 30  --   --  34*  GLUCOSE 130* 132*  --  140*  BUN 38* 38*  --  29*  CREATININE 2.16* 2.20*  --  1.27*  CALCIUM 9.6  --   --  9.1  MG  --   --  1.4*  --     Recent Labs Lab  12/08/14 1721  AST 21  ALT 15  ALKPHOS 73  BILITOT 0.2*  PROT 7.2  ALBUMIN 3.9    Recent Labs Lab 12/08/14 1721 12/08/14 1735  WBC 7.4  --   NEUTROABS 5.2  --   HGB 10.3* 11.6*  HCT 31.4* 34.0*  MCV 83.7  --   PLT 266  --     Recent Labs Lab 12/08/14 2354  TROPONINI <0.03    Recent Labs  12/08/14 1721  LABPROT 13.6  INR 1.02    Recent Labs  12/08/14 1809  COLORURINE YELLOW  LABSPEC 1.009  PHURINE 6.0  GLUCOSEU NEGATIVE  HGBUR NEGATIVE  BILIRUBINUR NEGATIVE  KETONESUR NEGATIVE  PROTEINUR NEGATIVE  UROBILINOGEN 0.2  NITRITE NEGATIVE  LEUKOCYTESUR TRACE*       Component Value Date/Time   CHOL 226 07/31/2010   TRIG 100 07/31/2010   HDL 87 07/31/2010   CHOLHDL 4.5 Ratio 04/08/2010 2142   VLDL NOT CALC mg/dL 04/08/2010 2142   LDLCALC 119 07/31/2010   Lab Results  Component Value Date   HGBA1C 6.8* 07/28/2014      Component Value Date/Time   LABOPIA NONE DETECTED 12/08/2014 1809   LABOPIA NEG 10/29/2009 2213   COCAINSCRNUR NONE DETECTED 12/08/2014  1809   COCAINSCRNUR NEG 10/29/2009 2213   LABBENZ NONE DETECTED 12/08/2014 1809   LABBENZ POS* 10/29/2009 2213   AMPHETMU NONE DETECTED 12/08/2014 1809   AMPHETMU NEG 10/29/2009 2213   THCU NONE DETECTED 12/08/2014 1809   LABBARB NONE DETECTED 12/08/2014 1809     Recent Labs Lab 12/08/14 1721  ETH <5    Ct Head Wo Contrast 12/08/2014   CLINICAL DATA:   No significant abnormality identified.    Mr Maureen Swanson Head Wo Contrast 12/08/2014    1. No acute intracranial abnormality.  2. Very mild chronic small vessel ischemic disease in the cerebral white matter.  3. Negative head MRA allowing for motion artifact.        PHYSICAL EXAM  PHYSICAL EXAM Physical exam: Exam: Gen: NAD Eyes: anicteric sclerae, moist conjunctivae                    CV: no MRG, no carotid bruits, no peripheral edema Mental Status: Alert, follows commands, oriented   Neuro: Speech:    No aphasia, no  dysarthria  Cranial Nerves:    The pupils are equal, round, and reactive to light.. Fundi flat.  EOMI. No gaze preference. Visual fields full. Face symmetric, Tongue midline. Hearing intact to voice. Shoulder shrug intact. Uvula rises symmetricaly  Motor Observation:    no involuntary movements noted. Tone appears normal.     Strength:    Intact, poor effort     Sensation:  Intact to LT  Plantars downgoing.   DTRs: symmetrical throughout Coordination: No dysmetria   ASSESSMENT/PLAN [name] is a 49 y.o. female with history of bipolar disorder, previous stroke, hypertension, hyperlipidemia, diabetes mellitus, cocaine use, migraine headaches presenting with dizziness and left lower extremity weakness with inability to ambulate. She did not receive IV t-PA due to late presentation.  Possible TIA vs conversion disorder:  Non-dominant   Resultant  resolved  MRI  no acute intracranial abnormality  MRA  negative  Carotid Doppler not ordered  2D Echo  not ordered  LDL not ordered this admission  HgbA1c not ordered this admission  Subcutaneous heparin for VTE prophylaxis  Diet heart healthy/carb modified Room service appropriate?: Yes; Fluid consistency:: Thin  aspirin 81 mg orally every day prior to admission, now on aspirin 81 mg orally every day  Patient counseled to be compliant with her antithrombotic medications  Ongoing aggressive stroke risk factor management  Therapy recommendations: Pending  Disposition:  Pending  Hypertension  Home meds: Atenolol and hydrochlorothiazide  Stable  Hyperlipidemia  Home meds: Lipitor 40 mg daily  resumed in hospital  LDL not ordered, goal < 70  Continue statin at discharge  Diabetes  HgbA1c not ordered, goal < 7.0  Controlled  Other Stroke Risk Factors  Cigarette smoker, quit smoking 15 years ago.  Obesity, Body mass index is 39.64 kg/(m^2).   Hx stroke  Cocaine use  Migraines    Other  Active Problems  Schizoaffective disorder with conversion disorder per psychiatric consult 12/09/2014    Other Pertinent History  Admitted at [facility] from 11/03/14-11/08/14 and received TPA for suspected stroke. Patient presented with blurred vision, diplopia, and left sided weakness at that time.   Patient's exam not consistent with stroke or TIA. The stroke team will sign off at this time. Please call if we can be of further service.   Hospital day # 1  Patient's exam is non focal today. She has poor effort and inconsistent history and story. MRi  of the brain did not show acute event. This is likely psychogenic and stroke team will sign off. Personally examined patient and images, and have participated in and made any corrections needed to history, physical, neuro exam,assessment and plan as stated above.   Sarina Ill, MD Stroke Neurology [phone] [facility]    To contact Stroke Continuity provider, please refer to [email or web address] After hours, contact General Neurology

## 2014-12-09 NOTE — Consult Note (Signed)
[place] [facility] Consult   Reason for Consult:  Conversion disorder and schizoaffective disorder, substance abuse  Referring Physician:  Dr. Daryll Drown Patient Identification: Maureen Swanson MRN:  [id number] Principal Diagnosis: Schizoaffective disorder, depressive type Diagnosis:   Patient Active Problem List   Diagnosis Date Noted  . Conversion disorder [F44.9] 12/09/2014  . Acute ischemic stroke [I63.50] 12/08/2014  . Left-sided weakness [M62.89] 12/08/2014  . Schizoaffective disorder, depressive type [F25.1] 07/24/2014  . Cocaine use disorder, moderate, dependence [F14.20]   . Depression [F32.9] 02/17/2014  . 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 Knee pain [M25.569] 10/17/2010  . HYPERGLYCEMIA [R73.09] 08/16/2010  . MAMMOGRAM, ABNORMAL, RIGHT [R92.8] 08/05/2010  . HYPERLIPIDEMIA [E78.5] 06/21/2010  . AMENORRHEA, SECONDARY [N91.2] 10/29/2009  . 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 HELICOBACTER PYLORI INFECTION [B96.81] 04/02/2009  . GERD [K21.9] 10/11/2007  . Essential hypertension [I10] 02/26/2007    Total Time spent with patient: 1 hour  Subjective:   Maureen Swanson is a 49 y.o. female patient admitted with left sided weakness.  HPI:  Maureen Swanson is a 49 years old female admitted to [facility] with left-sided weakness. Psychiatric consultation requested as no old chronic cause for her neurological symptoms. Patient reported that she was stressed about her medical problems, not able to get her disability, no income and has been verbally altercation with her son's fiance regarding the dog walk. Patient reportedly suffering with schizoaffective disorder, alcohol and cocaine abuse also admitted to behavioral [place] several months ago. Patient participated in [facility] for chemical dependency rehabilitation and then [facility] where she stayed about 1 month. Reportedly patient had similar symptoms like stroke  required to go to [facility] and reportedly she has no stroke and possibly conversion disorder as per the discharge summary. Patient has been staying with her son and his fianc and continued to have multiple psychosocial stresses. Patient continued to report symptoms of dizziness, chest pain, generalized weakness, hand tremors or jerking movements. Patient has poor appetite, decreased appetite x 3 days. Denies diarrhea, nausea or vomiting. Denies illicit drug use ( last used cocaine 07/2014), EtOH use, and cigarette smoking. Denies suicidal ideation, intention or plans, homicidal ideation, intention or plans, psychosis.  Past psychiatric history: Patient has been receiving outpatient medication management from [place] [facility] [place] [place]. Patient was admitted to behavioral [place] about 2 months ago with the substance abuse, suicidal and homicidal ideation and altercation with her boyfriend.  HPI Elements:  Location:  Schizoaffective disorder and substance abuse. Quality:  Poor. Severity:  Left-sided weakness without organic findings. Timing:  Multiple stresses and altercations. Duration:  3 days. Context:  Psychosocial stressors and medic chronic medical problems.  Past Medical History:  Past Medical History  Diagnosis Date  . High cholesterol   . Depression   . Gout   . Anxiety   . Bipolar 1 disorder   . GERD (gastroesophageal reflux disease)   . Substance abuse     crack cocaine  . Headache(784.0)     migraines  . TMJ (temporomandibular joint disorder)   . Asthma     daily and prn inhalers  . Arthritis     back and knees  . Gastric ulcer   . 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 Hypertension     under control, has been on med. x 12 yrs.  . Diabetes mellitus  diet-controlled    Past Surgical History  Procedure Laterality Date  . Knee arthroscopy w/ partial medial meniscectomy   05/01/2010    right  . Breast lumpectomy with needle localization  04/19/2012    Procedure: BREAST LUMPECTOMY WITH NEEDLE LOCALIZATION;  Surgeon: [name], MD;  Location: [place];  Service: General;  Laterality: Right;   Family History:  Family History  Problem Relation Age of Onset  . Diabetes Mother   . Cancer Mother     breast  . Cancer Father    Social History:  History  Alcohol Use No     History  Drug Use No    Comment: last use 08/02/2012    History   Social History  . Marital Status: Single    Spouse Name: N/A  . Number of Children: N/A  . Years of Education: N/A   Social History Main Topics  . Smoking status: Former Smoker -- 15 years    Quit date: 08/02/2012  . Smokeless tobacco: Never Used     Comment: quit smoking 02/2012  . Alcohol Use: No  . Drug Use: No     Comment: last use 08/02/2012  . Sexual Activity: Not on file   Other Topics Concern  . None   Social History Narrative   Additional Social History:                          Allergies:   Allergies  Allergen Reactions  . Chocolate Hives  . Orange Hives    "Acid foods"  . Penicillins Hives  . Tomato Hives    "acid foods"    Labs:  Results for orders placed or performed during the hospital encounter of 12/08/14 (from the past 48 hour(s))  Ethanol     Status: None   Collection Time: 12/08/14  5:21 PM  Result Value Ref Range   Alcohol, Ethyl (B) <5 <5 mg/dL    Comment:        LOWEST DETECTABLE LIMIT FOR SERUM ALCOHOL IS 5 mg/dL FOR MEDICAL PURPOSES ONLY   Protime-INR     Status: None   Collection Time: 12/08/14  5:21 PM  Result Value Ref Range   Prothrombin Time 13.6 11.6 - 15.2 seconds   INR 1.02 0.00 - 1.49  APTT     Status: None   Collection Time: 12/08/14  5:21 PM  Result Value Ref Range   aPTT 28 24 - 37 seconds  CBC     Status: Abnormal   Collection Time: 12/08/14  5:21 PM  Result Value Ref Range   WBC 7.4 4.0 - 10.5 K/uL   RBC 3.75 (L)  3.87 - 5.11 MIL/uL   Hemoglobin 10.3 (L) 12.0 - 15.0 g/dL   HCT 31.4 (L) 36.0 - 46.0 %   MCV 83.7 78.0 - 100.0 fL   MCH 27.5 26.0 - 34.0 pg   MCHC 32.8 30.0 - 36.0 g/dL   RDW 13.0 11.5 - 15.5 %   Platelets 266 150 - 400 K/uL  Differential     Status: None   Collection Time: 12/08/14  5:21 PM  Result Value Ref Range   Neutrophils Relative % 70 43 - 77 %   Neutro Abs 5.2 1.7 - 7.7 K/uL   Lymphocytes Relative 23 12 - 46 %   Lymphs Abs 1.7 0.7 - 4.0 K/uL   Monocytes Relative 6 3 - 12 %   Monocytes Absolute 0.5 0.1 -  1.0 K/uL   Eosinophils Relative 1 0 - 5 %   Eosinophils Absolute 0.0 0.0 - 0.7 K/uL   Basophils Relative 0 0 - 1 %   Basophils Absolute 0.0 0.0 - 0.1 K/uL  Comprehensive metabolic panel     Status: Abnormal   Collection Time: 12/08/14  5:21 PM  Result Value Ref Range   Sodium 136 135 - 145 mmol/L   Potassium 2.9 (L) 3.5 - 5.1 mmol/L   Chloride 93 (L) 101 - 111 mmol/L   CO2 30 22 - 32 mmol/L   Glucose, Bld 130 (H) 65 - 99 mg/dL   BUN 38 (H) 6 - 20 mg/dL   Creatinine, Ser 2.16 (H) 0.44 - 1.00 mg/dL   Calcium 9.6 8.9 - 10.3 mg/dL   Total Protein 7.2 6.5 - 8.1 g/dL   Albumin 3.9 3.5 - 5.0 g/dL   AST 21 15 - 41 U/L   ALT 15 14 - 54 U/L   Alkaline Phosphatase 73 38 - 126 U/L   Total Bilirubin 0.2 (L) 0.3 - 1.2 mg/dL   GFR calc non Af Amer 26 (L) >60 mL/min   GFR calc Af Amer 30 (L) >60 mL/min    Comment: (NOTE) The eGFR has been calculated using the CKD EPI equation. This calculation has not been validated in all clinical situations. eGFR's persistently <60 mL/min signify possible Chronic Kidney Disease.    Anion gap 13 5 - 15  I-stat troponin, ED (not at [facility], [facility])     Status: None   Collection Time: 12/08/14  5:32 PM  Result Value Ref Range   Troponin i, poc 0.00 0.00 - 0.08 ng/mL   Comment 3            Comment: Due to the release kinetics of cTnI, a negative result within the first hours of the onset of symptoms does not rule out myocardial infarction with  certainty. If myocardial infarction is still suspected, repeat the test at appropriate intervals.   I-Stat Chem 8, ED  (not at [facility], [facility])     Status: Abnormal   Collection Time: 12/08/14  5:35 PM  Result Value Ref Range   Sodium 136 135 - 145 mmol/L   Potassium 2.8 (L) 3.5 - 5.1 mmol/L   Chloride 93 (L) 101 - 111 mmol/L   BUN 38 (H) 6 - 20 mg/dL   Creatinine, Ser 2.20 (H) 0.44 - 1.00 mg/dL   Glucose, Bld 132 (H) 65 - 99 mg/dL   Calcium, Ion 1.10 (L) 1.12 - 1.23 mmol/L   TCO2 28 0 - 100 mmol/L   Hemoglobin 11.6 (L) 12.0 - 15.0 g/dL   HCT 34.0 (L) 36.0 - 46.0 %  CBG monitoring, ED     Status: Abnormal   Collection Time: 12/08/14  5:35 PM  Result Value Ref Range   Glucose-Capillary 111 (H) 65 - 99 mg/dL  Urine rapid drug screen (hosp performed)not at [facility]     Status: None   Collection Time: 12/08/14  6:09 PM  Result Value Ref Range   Opiates NONE DETECTED NONE DETECTED   Cocaine NONE DETECTED NONE DETECTED   Benzodiazepines NONE DETECTED NONE DETECTED   Amphetamines NONE DETECTED NONE DETECTED   Tetrahydrocannabinol NONE DETECTED NONE DETECTED   Barbiturates NONE DETECTED NONE DETECTED    Comment:        DRUG SCREEN FOR MEDICAL PURPOSES ONLY.  IF CONFIRMATION IS NEEDED FOR ANY PURPOSE, NOTIFY LAB WITHIN 5 DAYS.  LOWEST DETECTABLE LIMITS FOR URINE DRUG SCREEN Drug Class       Cutoff (ng/mL) Amphetamine      1000 Barbiturate      200 Benzodiazepine   629 Tricyclics       528 Opiates          300 Cocaine          300 THC              50   Urinalysis, Routine w reflex microscopic (not at [facility])     Status: Abnormal   Collection Time: 12/08/14  6:09 PM  Result Value Ref Range   Color, Urine YELLOW YELLOW   APPearance CLEAR CLEAR   Specific Gravity, Urine 1.009 1.005 - 1.030   pH 6.0 5.0 - 8.0   Glucose, UA NEGATIVE NEGATIVE mg/dL   Hgb urine dipstick NEGATIVE NEGATIVE   Bilirubin Urine NEGATIVE NEGATIVE   Ketones, ur NEGATIVE NEGATIVE mg/dL   Protein, ur  NEGATIVE NEGATIVE mg/dL   Urobilinogen, UA 0.2 0.0 - 1.0 mg/dL   Nitrite NEGATIVE NEGATIVE   Leukocytes, UA TRACE (A) NEGATIVE  Urine microscopic-add on     Status: Abnormal   Collection Time: 12/08/14  6:09 PM  Result Value Ref Range   Squamous Epithelial / LPF RARE RARE   WBC, UA 0-2 <3 WBC/hpf   Casts GRANULAR CAST (A) NEGATIVE  Magnesium     Status: Abnormal   Collection Time: 12/08/14 11:54 PM  Result Value Ref Range   Magnesium 1.4 (L) 1.7 - 2.4 mg/dL  Troponin I     Status: None   Collection Time: 12/08/14 11:54 PM  Result Value Ref Range   Troponin I <0.03 <0.031 ng/mL    Comment:        NO INDICATION OF MYOCARDIAL INJURY.   Basic metabolic panel     Status: Abnormal   Collection Time: 12/09/14  5:30 AM  Result Value Ref Range   Sodium 139 135 - 145 mmol/L   Potassium 3.3 (L) 3.5 - 5.1 mmol/L   Chloride 98 (L) 101 - 111 mmol/L   CO2 34 (H) 22 - 32 mmol/L   Glucose, Bld 140 (H) 65 - 99 mg/dL   BUN 29 (H) 6 - 20 mg/dL   Creatinine, Ser 1.27 (H) 0.44 - 1.00 mg/dL   Calcium 9.1 8.9 - 10.3 mg/dL   GFR calc non Af Amer 49 (L) >60 mL/min   GFR calc Af Amer 56 (L) >60 mL/min    Comment: (NOTE) The eGFR has been calculated using the CKD EPI equation. This calculation has not been validated in all clinical situations. eGFR's persistently <60 mL/min signify possible Chronic Kidney Disease.    Anion gap 7 5 - 15  Glucose, capillary     Status: Abnormal   Collection Time: 12/09/14  6:34 AM  Result Value Ref Range   Glucose-Capillary 132 (H) 65 - 99 mg/dL   Comment 1 Notify RN    Comment 2 Document in Chart   Glucose, capillary     Status: Abnormal   Collection Time: 12/09/14 11:24 AM  Result Value Ref Range   Glucose-Capillary 188 (H) 65 - 99 mg/dL    Vitals: Blood pressure 105/67, pulse 70, temperature 98.2 F (36.8 C), temperature source Oral, resp. rate 18, height _0  (1.626 m), weight 104.8 kg (231 lb 0.7 oz), SpO2 96 %.  Risk to Self: Is patient at risk for  suicide?: No Risk to Others:   Prior Inpatient  Therapy:   Prior Outpatient Therapy:    Current Facility-Administered Medications  Medication Dose Route Frequency Provider Last Rate Last Dose  . acetaminophen (TYLENOL) tablet 650 mg  650 mg Oral Q6H PRN Jones Bales, MD   650 mg at 12/09/14 1220  . albuterol (PROVENTIL) (2.5 MG/3ML) 0.083% nebulizer solution 2.5 mg  2.5 mg Inhalation Q6H PRN Ejiroghene Arlyce Dice, MD      . aspirin EC tablet 81 mg  81 mg Oral Daily Ejiroghene E Denton Brick, MD   81 mg at 12/09/14 0940  . atenolol (TENORMIN) tablet 100 mg  100 mg Oral Daily Ejiroghene E Emokpae, MD   100 mg at 12/09/14 1000  . atorvastatin (LIPITOR) tablet 40 mg  40 mg Oral Daily Ejiroghene E Denton Brick, MD   40 mg at 12/09/14 0940  . citalopram (CELEXA) tablet 20 mg  20 mg Oral Daily Ejiroghene E Denton Brick, MD   20 mg at 12/09/14 0940  . folic acid (FOLVITE) tablet 1 mg  1 mg Oral Daily Ejiroghene E Emokpae, MD   1 mg at 12/09/14 0940  . heparin injection 5,000 Units  5,000 Units Subcutaneous 3 times per day Ejiroghene E Denton Brick, MD      . insulin aspart (novoLOG) injection 0-15 Units  0-15 Units Subcutaneous TID WC Ejiroghene Arlyce Dice, MD   3 Units at 12/09/14 1220  . loratadine (CLARITIN) tablet 10 mg  10 mg Oral Daily Ejiroghene Arlyce Dice, MD   10 mg at 12/09/14 0940  . LORazepam (ATIVAN) tablet 1 mg  1 mg Oral Q6H PRN Ejiroghene Arlyce Dice, MD       Or  . LORazepam (ATIVAN) injection 1 mg  1 mg Intravenous Q6H PRN Ejiroghene Arlyce Dice, MD      . multivitamin with minerals tablet 1 tablet  1 tablet Oral Daily Ejiroghene Arlyce Dice, MD   1 tablet at 12/09/14 0940  . Oxcarbazepine (TRILEPTAL) tablet 600 mg  600 mg Oral BID Ejiroghene Arlyce Dice, MD   600 mg at 12/09/14 0940  . [START ON 12/10/2014] pantoprazole (PROTONIX) EC tablet 40 mg  40 mg Oral Daily Carly J Rivet, MD      . polyethylene glycol (MIRALAX / GLYCOLAX) packet 17 g  17 g Oral BID Bethena Roys, MD   17 g at 12/09/14 0939  .  QUEtiapine (SEROQUEL XR) 24 hr tablet 600 mg  600 mg Oral Q2000 Ejiroghene E Emokpae, MD   600 mg at 12/08/14 2346  . sodium chloride 0.9 % injection 3 mL  3 mL Intravenous Q12H Ejiroghene E Emokpae, MD   3 mL at 12/08/14 2354  . thiamine (VITAMIN B-1) tablet 100 mg  100 mg Oral Daily Ejiroghene Arlyce Dice, MD   100 mg at 12/09/14 0940   Or  . thiamine (B-1) injection 100 mg  100 mg Intravenous Daily Ejiroghene Arlyce Dice, MD        Musculoskeletal: Strength & Muscle Tone: decreased Gait & Station: unable to stand Patient leans: N/A  Psychiatric Specialty Exam: Physical Exam as per history and physical   ROS left-sided weakness, dizziness, chest pain, denied nausea, vomiting, constipation, shortness of breath No Fever-chills, No Headache, No changes with Vision or hearing, reports vertigo No problems swallowing food or Liquids, No Chest pain, Cough or Shortness of Breath, No Abdominal pain, No Nausea or Vommitting, Bowel movements are regular, No Blood in stool or Urine, No dysuria, No new skin rashes or bruises, No new joints pains-aches,  No new  weakness, tingling, numbness in any extremity, No recent weight gain or loss, No polyuria, polydypsia or polyphagia,   A full 10 point Review of Systems was done, except as stated above, all other Review of Systems were negative.  Blood pressure 105/67, pulse 70, temperature 98.2 F (36.8 C), temperature source Oral, resp. rate 18, height _0  (1.626 m), weight 104.8 kg (231 lb 0.7 oz), SpO2 96 %.Body mass index is 39.64 kg/(m^2).  General Appearance: Guarded  Eye Contact::  Good  Speech:  Clear and Coherent and Slow  Volume:  Decreased  Mood:  Depressed  Affect:  Congruent and Constricted  Thought Process:  Coherent and Goal Directed  Orientation:  Full (Time, Place, and Person)  Thought Content:  Rumination  Suicidal Thoughts:  No  Homicidal Thoughts:  No  Memory:  Immediate;   Good Recent;   Good  Judgement:  Fair  Insight:   Fair  Psychomotor Activity:  Decreased  Concentration:  Good  Recall:  Good  Fund of Knowledge:Good  Language: Good  Akathisia:  Negative  Handed:  Right  AIMS (if indicated):     Assets:  Communication Skills Desire for Improvement Financial Resources/Insurance Housing Intimacy Leisure Time Resilience Social Support Talents/Skills Transportation  ADL's:  Impaired  Cognition: WNL  Sleep:      Medical Decision Making: Review of Psycho-Social Stressors (1), Review or order clinical lab tests (1), Established Problem, Worsening (2), New Problem, with no additional work-up planned (3), Review of Last Therapy Session (1), Review or order medicine tests (1), Review of Medication Regimen & Side Effects (2) and Review of New Medication or Change in Dosage (2)  Treatment Plan Summary: Patient presented with left-sided weakness and possible stroke workup. Patient has similar symptoms about a month ago and workup was done at [facility]. Patient has schizoaffective disorder and substance abuse, status post chemical dependency rehabilitation and had multiple psychosocial stresses might be presenting as a commotion disorder this time. Patient denies current symptoms of depression, anxiety, psychosis and contract for safety. Patient denies suicidal homicidal ideation Daily contact with patient to assess and evaluate symptoms and progress in treatment and Medication management  Plan:  Safety concerns: Patient has denied suicidal/homicidal ideation  Mood swings: Trileptal 600 mg twice daily for mood stabilization, will check carbamazepine level and monitor for possible hyponatremia  Decreased Seroquel XL to 400 mg daily secondary to increased akathisia and restlessness  Depression: Discontinue Celexa as it can cause more side effects than helpful at this time  Substance abuse: Patient currently is being sober for 2 months and continue counseling  Patient does not meet criteria  for psychiatric inpatient admission. Supportive therapy provided about ongoing stressors.  Appreciate psychiatric consultation and follow up as clinically required Please contact [phone] or [phone] if needs further assistance  Disposition: Patient will be referred to the outpatient psychiatric medication management at [facility] [place] when medically stable.   Newel Oien,JANARDHAHA R. 12/09/2014 2:48 PM

## 2014-12-09 NOTE — Progress Notes (Signed)
I was called by the RN as the pt's daughter in law was very upset about she being on suicide precaution and wanted to talk to a physician.  I came and explained my role of taking care of urgent things over night and that I may not be able to answer all of their questions as I am the the provider covering for the team. I went over the MRI results and explained that it does not show any stroke. I explained some labs are pending to look for why she had the weakness.   I read Dr. Lamar Benes psychiatrist's note and it states the patient is no longer suicidal so I discontinued the suicide precaution.  I explained that the regular team will be back tomorrow morning around 7 and start rounding around 8 or 9 am, and will contact the daughter in law in updates.   Daughter in law can be reached at [phone].

## 2014-12-09 NOTE — Progress Notes (Signed)
Subjective: Patient reports she is still experiencing left sided weakness. She states her weakness comes and goes and is associated with blurry vision and dizziness. She denies blurry vision or dizziness currently.   Objective: Vital signs in last 24 hours: Filed Vitals:   12/09/14 0400 12/09/14 0500 12/09/14 0900 12/09/14 1026  BP: 133/84 121/79 114/66 105/67  Pulse:  92 72 70  Temp:    98.2 F (36.8 C)  TempSrc:    Oral  Resp: 19 19 20 18   Height:      Weight:      SpO2:   97% 96%   Weight change:   Intake/Output Summary (Last 24 hours) at 12/09/14 1108 Last data filed at 12/09/14 0946  Gross per 24 hour  Intake    240 ml  Output      0 ml  Net    240 ml   Physical Exam General: middle aged woman sitting up in bed, NAD HEENT: [place]/AT, EOMI, sclera anicteric, mucus membranes moist CV: RRR, no m/g/r Pulm: Mild crackles heard at left lung base, breathing comfortably on 2 L oxygen via  Abd: BS+, soft, non-tender Ext: warm, no edema Neuro: alert and oriented x 3. LUE and LLE have 4/5 strength. Other extremities normal. Finger to nose slower and not always on target on left side. She is able to do rapid movements with both hands normally.   Lab Results: Basic Metabolic Panel:  Recent Labs Lab 12/08/14 1721 12/08/14 1735 12/08/14 2354 12/09/14 0530  NA 136 136  --  139  K 2.9* 2.8*  --  3.3*  CL 93* 93*  --  98*  CO2 30  --   --  34*  GLUCOSE 130* 132*  --  140*  BUN 38* 38*  --  29*  CREATININE 2.16* 2.20*  --  1.27*  CALCIUM 9.6  --   --  9.1  MG  --   --  1.4*  --    Liver Function Tests:  Recent Labs Lab 12/08/14 1721  AST 21  ALT 15  ALKPHOS 73  BILITOT 0.2*  PROT 7.2  ALBUMIN 3.9   CBC:  Recent Labs Lab 12/08/14 1721 12/08/14 1735  WBC 7.4  --   NEUTROABS 5.2  --   HGB 10.3* 11.6*  HCT 31.4* 34.0*  MCV 83.7  --   PLT 266  --    Cardiac Enzymes:  Recent Labs Lab 12/08/14 2354  TROPONINI <0.03   CBG:  Recent Labs Lab  12/08/14 1735 12/09/14 0634  GLUCAP 111* 132*   Coagulation:  Recent Labs Lab 12/08/14 1721  LABPROT 13.6  INR 1.02   Urine Drug Screen: Drugs of Abuse     Component Value Date/Time   LABOPIA NONE DETECTED 12/08/2014 1809   LABOPIA NEG 10/29/2009 2213   COCAINSCRNUR NONE DETECTED 12/08/2014 1809   COCAINSCRNUR NEG 10/29/2009 2213   LABBENZ NONE DETECTED 12/08/2014 1809   LABBENZ POS* 10/29/2009 2213   AMPHETMU NONE DETECTED 12/08/2014 1809   AMPHETMU NEG 10/29/2009 2213   THCU NONE DETECTED 12/08/2014 1809   LABBARB NONE DETECTED 12/08/2014 1809    Alcohol Level:  Recent Labs Lab 12/08/14 1721  ETH <5   Urinalysis:  Recent Labs Lab 12/08/14 1809  COLORURINE YELLOW  LABSPEC 1.009  PHURINE 6.0  GLUCOSEU NEGATIVE  HGBUR NEGATIVE  BILIRUBINUR NEGATIVE  KETONESUR NEGATIVE  PROTEINUR NEGATIVE  UROBILINOGEN 0.2  NITRITE NEGATIVE  LEUKOCYTESUR TRACE*   Studies/Results: Ct Head Wo Contrast  12/08/2014   ADDENDUM REPORT: 12/08/2014 17:43  ADDENDUM: The original report was by Dr. Van Clines. The following addendum is by Dr. Van Clines:  These results were called by telephone at the time of interpretation on 12/08/2014 at 5:35pm to Dr. Armida Sans, who verbally acknowledged these results.   Electronically Signed   By: Van Clines M.D.   On: 12/08/2014 17:43   12/08/2014   CLINICAL DATA:  Left lower extremity weakness and slurred speech  EXAM: CT HEAD WITHOUT CONTRAST  TECHNIQUE: Contiguous axial images were obtained from the base of the skull through the vertex without intravenous contrast.  COMPARISON:  11/08/2014  FINDINGS: The brainstem, cerebellum, cerebral peduncles, thalamus, basal ganglia, basilar cisterns, and ventricular system appear within normal limits. No intracranial hemorrhage, mass lesion, or acute CVA.  IMPRESSION: No significant abnormality identified.  Electronically Signed: By: Van Clines M.D. On: 12/08/2014 17:27   Mr Jodene Nam Head  Wo Contrast  12/08/2014   CLINICAL DATA:  Worsening left leg weakness. Slurred speech and blurred vision for 2 weeks. Prior stroke with left-sided deficit. New onset altered gait. History of hypertension, diabetes, and hypercholesterolemia.  EXAM: MRI HEAD WITHOUT CONTRAST  MRA HEAD WITHOUT CONTRAST  TECHNIQUE: Multiplanar, multiecho pulse sequences of the brain and surrounding structures were obtained without intravenous contrast. Angiographic images of the head were obtained using MRA technique without contrast.  COMPARISON:  Head CT 12/08/2014, MRI 11/08/2014, and MRA 11/03/2014  FINDINGS: MRI HEAD FINDINGS  Some sequences are mildly to moderately degraded by motion artifact.  A partially empty sella is again noted. There is no evidence of acute infarct, intracranial hemorrhage, mass, midline shift, or extra-axial fluid collection. Small foci of T2 hyperintensity in the white matter of the parietal lobes are unchanged from the prior MRI and nonspecific but compatible with very mild chronic small vessel ischemic disease. Ventricles and sulci are normal.  Orbits are unremarkable. Paranasal sinuses and mastoid air cells are clear. Major intracranial vascular flow voids are preserved.  MRA HEAD FINDINGS  Images are mildly to moderately degraded by motion artifact.  Visualized distal vertebral arteries are patent and tortuous without evidence of significant stenosis. PICA and SCA origins are patent. Basilar artery is patent with motion artifact proximally but no evidence of significant stenosis. Posterior communicating arteries are not clearly identified. PCAs are patent without evidence of significant stenosis.  Visualized distal cervical ICA on the left is tortuous. Intracranial ICAs are patent without evidence of significant stenosis. ACAs and MCAs are patent without evidence of stenosis. No intracranial aneurysm is identified.  IMPRESSION: 1. No acute intracranial abnormality. 2. Very mild chronic small vessel  ischemic disease in the cerebral white matter. 3. Negative head MRA allowing for motion artifact.   Electronically Signed   By: Logan Bores   On: 12/08/2014 19:46   Mr Brain Wo Contrast  12/08/2014   CLINICAL DATA:  Worsening left leg weakness. Slurred speech and blurred vision for 2 weeks. Prior stroke with left-sided deficit. New onset altered gait. History of hypertension, diabetes, and hypercholesterolemia.  EXAM: MRI HEAD WITHOUT CONTRAST  MRA HEAD WITHOUT CONTRAST  TECHNIQUE: Multiplanar, multiecho pulse sequences of the brain and surrounding structures were obtained without intravenous contrast. Angiographic images of the head were obtained using MRA technique without contrast.  COMPARISON:  Head CT 12/08/2014, MRI 11/08/2014, and MRA 11/03/2014  FINDINGS: MRI HEAD FINDINGS  Some sequences are mildly to moderately degraded by motion artifact.  A partially empty sella is again noted. There is no evidence  of acute infarct, intracranial hemorrhage, mass, midline shift, or extra-axial fluid collection. Small foci of T2 hyperintensity in the white matter of the parietal lobes are unchanged from the prior MRI and nonspecific but compatible with very mild chronic small vessel ischemic disease. Ventricles and sulci are normal.  Orbits are unremarkable. Paranasal sinuses and mastoid air cells are clear. Major intracranial vascular flow voids are preserved.  MRA HEAD FINDINGS  Images are mildly to moderately degraded by motion artifact.  Visualized distal vertebral arteries are patent and tortuous without evidence of significant stenosis. PICA and SCA origins are patent. Basilar artery is patent with motion artifact proximally but no evidence of significant stenosis. Posterior communicating arteries are not clearly identified. PCAs are patent without evidence of significant stenosis.  Visualized distal cervical ICA on the left is tortuous. Intracranial ICAs are patent without evidence of significant stenosis. ACAs  and MCAs are patent without evidence of stenosis. No intracranial aneurysm is identified.  IMPRESSION: 1. No acute intracranial abnormality. 2. Very mild chronic small vessel ischemic disease in the cerebral white matter. 3. Negative head MRA allowing for motion artifact.   Electronically Signed   By: Logan Bores   On: 12/08/2014 19:46   Medications: I have reviewed the patient's current medications. Scheduled Meds: . aspirin EC  81 mg Oral Daily  . atenolol  100 mg Oral Daily  . atorvastatin  40 mg Oral Daily  . citalopram  20 mg Oral Daily  . folic acid  1 mg Oral Daily  . heparin  5,000 Units Subcutaneous 3 times per day  . insulin aspart  0-15 Units Subcutaneous TID WC  . loratadine  10 mg Oral Daily  . multivitamin with minerals  1 tablet Oral Daily  . oxcarbazepine  600 mg Oral BID  . pantoprazole  20 mg Oral Daily  . polyethylene glycol  17 g Oral BID  . QUEtiapine  600 mg Oral Q2000  . sodium chloride  3 mL Intravenous Q12H  . thiamine  100 mg Oral Daily   Or  . thiamine  100 mg Intravenous Daily   Continuous Infusions: . 0.9 % NaCl with KCl 40 mEq / L 75 mL/hr (12/08/14 2355)   PRN Meds:.acetaminophen, albuterol, LORazepam **OR** LORazepam Assessment/Plan:  Left Sided Weakness: Patient presented with a 1 week hx of left sided weakness, blurry vision, and dizziness. CT and MRI/MRA were negative for stroke. Patient noted to have an admission at [facility] from 11/03/14-11/08/14 for blurred vision, diplopia, and left sided weakness. She received tPA during this admission as stroke was suspected. However, imaging at that time was negative for stroke as well. Imaging only showed chronic small vessel disease which is consistent with most recent scans yesterday. The etiology of her symptoms was not determined. She was then admitted to physical medicine and rehab at [facility] from 11/08/14-11/13/14 and noted to have conversion disorder listed on her discharge summary from there. However, I do not see  conversion disorder listed on the hospital d/c summary. Conversion disorder is one possibility to explain her symptoms, along with other psychiatric illness, but other etiologies should be ruled out first. Another possibility is MS given the waxing/waning of her symptoms and she did have diplopia on her initial admission for these symptoms. However, MRI does not show any lesions consistent with this diagnosis. Stroke has been ruled out. A neuromuscular disorder should also be considered. Urine without hemoglobin.  - Psychiatry consulted, appreciate recommendations - Can consider MRI of the spine to look  for MS plaques - Check CK, aldolase, LDH - PT/ OT consulted    AKI: Cr 2.20 with BUN 38 on admission. She was started on NS with KCl 40 mEq at 75 ml/hr. Cr has improved to 1.27 with BUN 27 this morning. Baseline appears to be 0.8-0.9. Likely secondary to volume depletion. - Continue NS with KCl 40 mEq at 75 ml/hr - bmet in AM  Hypokalemia: K 2.8 on admission. Given K-Dur 40 mEq. K 3.3 this morning. Her Mg was low at 1.4. Both potassium and magnesium replaced.  - bmet in AM  HTN: BP stable in 267T-245Y systolic. She takes Atenolol 100mg  daily, Lasix 40mg  daily, HCTZ 25mg  daily, and Hydralazine 25mg  TID daily at home.  - Continue Atenolol 100 mg daily - Hold other home meds given normotensive   Type 2 DM: Last HbA1c 6.8 on 11/04/14. Patient takes Glipizide 10 mg daily and Metformin 1000 mg BID. - Hold home meds - Continue moderate ISS - CBGs 4 times daily   Hyperlipidemia: Last lipid panel on 11/07/14 showed Chol 213, Trigly 137, HDL 61, and LDL 125. She takes Atorvastatin 40 mg daily at home. - Continue home Atorvastatin   Depression, Hx Suicide Attempt, Schizoaffective Disorder, Anxiety: Patient with an extensive psychiatric history. She was last hospitalized at [facility] from 07/21/14-08/02/14 for suicidal/homicidal ideation after reporting she overdosed on her Seroquel and Trileptal. She has also cut  her wrists in the past. She takes Trileptal 600 mg BID, Seroquel 600 mg daily, Celexa 20 mg daily, and Hydroxyzine 25 mg Q6H PRN at home. She denies SI/HI currently. Will have psychiatry evaluate her as her symptoms of left sided weakness may be related to conversion disorder. She has been diagnosed with conversion disorder in the past as well.  - Psychiatry consulted, appreciate recommendations - Continue home meds  - Suicide precautions   Asthma: Patient uses an albuterol inhaler at home. - Continue albuterol   GERD: Takes both Dexilant 30 mg daily and Prilosec 40 mg daily at home. - Will switch to Protonix 40 mg daily   Hx Alcohol Abuse: Patient used to drink 4-5 40 oz beers per day. Her last reported drink was in Feb 2016. - CIWA - MVI, thiamine, folate supplementation   Diet: Heart healthy/Carb modified  VTE PPx: Heparin SQ Dispo: Disposition is deferred at this time, awaiting improvement of current medical problems.  Anticipated discharge in approximately 1-2 day(s).   The patient does have a current PCP Elbert Ewings, FNP) and does need an [facility] hospital follow-up appointment after discharge.  The patient does not have transportation limitations that hinder transportation to clinic appointments.  .Services Needed at time of discharge: Y = Yes, Blank = No PT:   OT:   RN:   Equipment:   Other:     LOS: 1 day   Juliet Rude, MD 12/09/2014, 11:08 AM

## 2014-12-10 ENCOUNTER — Observation Stay (HOSPITAL_COMMUNITY): Payer: Self-pay

## 2014-12-10 LAB — GLUCOSE, CAPILLARY
GLUCOSE-CAPILLARY: 151 mg/dL — AB (ref 65–99)
Glucose-Capillary: 188 mg/dL — ABNORMAL HIGH (ref 65–99)
Glucose-Capillary: 136 mg/dL — ABNORMAL HIGH (ref 65–99)
Glucose-Capillary: 194 mg/dL — ABNORMAL HIGH (ref 65–99)

## 2014-12-10 LAB — BASIC METABOLIC PANEL
Anion gap: 8 (ref 5–15)
BUN: 15 mg/dL (ref 6–20)
CALCIUM: 9.4 mg/dL (ref 8.9–10.3)
CO2: 32 mmol/L (ref 22–32)
Chloride: 100 mmol/L — ABNORMAL LOW (ref 101–111)
Creatinine, Ser: 0.9 mg/dL (ref 0.44–1.00)
GFR calc Af Amer: >60 mL/min (ref >60)
GLUCOSE: 136 mg/dL — AB (ref 65–99)
Potassium: 3.3 mmol/L — ABNORMAL LOW (ref 3.5–5.1)
Sodium: 140 mmol/L (ref 135–145)

## 2014-12-10 LAB — CK: Total CK: 46 U/L (ref 38–234)

## 2014-12-10 LAB — MAGNESIUM: Magnesium: 1.5 mg/dL — ABNORMAL LOW (ref 1.7–2.4)

## 2014-12-10 LAB — ALDOLASE: Aldolase: 3.5 U/L (ref 3.3–10.3)

## 2014-12-10 LAB — LACTATE DEHYDROGENASE: LDH: 146 U/L (ref 98–192)

## 2014-12-10 MED ORDER — ENOXAPARIN SODIUM 60 MG/0.6ML ~~LOC~~ SOLN
50.0000 mg | SUBCUTANEOUS | Status: DC
  Administered 2014-12-10: 50 mg via SUBCUTANEOUS
  Filled 2014-12-10 (×2): qty 0.6

## 2014-12-10 MED ORDER — MAGNESIUM CITRATE PO SOLN
1.0000 | Freq: Once | ORAL | Status: AC
  Administered 2014-12-10: 1 via ORAL
  Filled 2014-12-10: qty 296

## 2014-12-10 NOTE — Evaluation (Signed)
Occupational Therapy Evaluation Patient Details Name: Maureen Swanson MRN: [id number] DOB: July 17, 1965 Today's Date: 12/10/2014    History of Present Illness 49 y.o. female Patient presented with a 1 week hx of left sided weakness, blurry vision, and dizziness. CT and MRI/MRA were negative for stroke. Hx of conversion disorder. Further testing for multiple sclerosis with MRI of spine.   Clinical Impression   This 49 yo female admitted with above presents to acute OT with decreased use/sensation of LUE/LLE all affecting the way and speed that she is able to accomplish her BADLs. She will benefit from acute OT without need for followup.    Follow Up Recommendations  No OT follow up    Equipment Recommendations  None recommended by OT       Precautions / Restrictions Precautions Precautions: Fall Restrictions Weight Bearing Restrictions: No      Mobility Bed Mobility Overal bed mobility: Modified Independent             General bed mobility comments: Pt up in bathroom upon my arrival with A of NT  Transfers Overall transfer level: Needs assistance Equipment used: Rolling walker (2 wheeled) Transfers: Sit to/from Stand Sit to Stand: Supervision            Balance Overall balance assessment: Needs assistance Sitting-balance support: No upper extremity supported;Feet supported Sitting balance-Leahy Scale: Good     Standing balance support: No upper extremity supported Standing balance-Leahy Scale: Fair                              ADL Overall ADL's : Needs assistance/impaired   Eating/Feeding Details (indicate cue type and reason): gave pt a piece of red tubing to help build up spoon/fork at meal times since she reports it is a little hard to hold onto utensils Grooming: Set up;Sitting   Upper Body Bathing: Set up;Sitting   Lower Body Bathing: Supervison/ safety;Set up;Sit to/from stand   Upper Body Dressing : Set up;Sitting   Lower Body  Dressing: Set up;Supervision/safety;Sit to/from stand   Toilet Transfer: Supervision/safety;Ambulation;RW;BSC   Toileting- Water quality scientist and Hygiene: Supervision/safety;Sit to/from stand               Vision Additional Comments: No change from baseline          Pertinent Vitals/Pain Pain Assessment: No/denies pain Faces Pain Scale: Hurts little more Pain Location: Left side Pain Descriptors / Indicators: Sore Pain Intervention(s): Monitored during session;Repositioned     Hand Dominance Left   Extremity/Trunk Assessment Upper Extremity Assessment Upper Extremity Assessment: LUE deficits/detail LUE Sensation: decreased light touch;decreased proprioception (mild) LUE Coordination: decreased fine motor;decreased gross motor (mild)         Communication Communication Communication: No difficulties   Cognition Arousal/Alertness: Awake/alert Behavior During Therapy: WFL for tasks assessed/performed Overall Cognitive Status: Within Functional Limits for tasks assessed Area of Impairment: Orientation Orientation Level: Time;Disoriented to (thought it was may 2016. It's june)                    Exercises   Other Exercises Other Exercises: Issued pt red theraputty and theraputty exercise sheet. Also gave her handout on FM activities/exercises for her to do with the ones marked throughout her day. Encouraged her to use, use, use her left (which should be fairly easy for her to do since she is left handed and only mild deficits)        Home Living Family/patient  expects to be discharged to:: Private residence Living Arrangements: Children Available Help at Discharge: Family;Available 24 hours/day Type of Home: Apartment Home Access: Level entry     Home Layout: One level     Bathroom Shower/Tub: Tub/shower unit;Curtain Shower/tub characteristics: Architectural technologist: Standard     Home Equipment: Environmental consultant - 2 wheels;Shower seat;Bedside commode           Prior Functioning/Environment Level of Independence: Needs assistance  Gait / Transfers Assistance Needed: Used RW for short distance in house ADL's / Homemaking Assistance Needed: Needs assist with bath/dress   Comments: with a cane    OT Diagnosis: Generalized weakness;Hemiplegia dominant side   OT Problem List: Decreased strength;Impaired UE functional use;Impaired balance (sitting and/or standing)   OT Treatment/Interventions: Self-care/ADL training;Patient/family education;Balance training;Therapeutic activities;Therapeutic exercise;DME and/or AE instruction    OT Goals(Current goals can be found in the care plan section) Acute Rehab OT Goals Patient Stated Goal: home today or tomorrow hopefully OT Goal Formulation: With patient Time For Goal Achievement: 12/17/14 Potential to Achieve Goals: Good  OT Frequency: Min 2X/week              End of Session Equipment Utilized During Treatment: Surveyor, mining Communication:  (IV came out)  Activity Tolerance: Patient tolerated treatment well Patient left: in chair;with call bell/phone within reach;with chair alarm set   Time: 1459-1521 OT Time Calculation (min): 22 min Charges:  OT General Charges $OT Visit: 1 Procedure OT Evaluation $Initial OT Evaluation Tier I: 1 Procedure G-Codes: OT G-codes **NOT FOR INPATIENT CLASS** Functional Assessment Tool Used: Clinical observation Functional Limitation: Self care Self Care Current Status (W4132): At least 1 percent but less than 20 percent impaired, limited or restricted Self Care Goal Status (G4010): At least 1 percent but less than 20 percent impaired, limited or restricted  Almon Register [phone] 12/10/2014, 3:35 PM

## 2014-12-10 NOTE — Progress Notes (Signed)
  Date: 12/10/2014  Patient name: Maureen Swanson  Medical record number: [id number]  Date of birth: 08-04-1965   This patient's plan of care was discussed with the house staff. Please see Dr. Ferd Glassing note for complete details. I concur with her  Findings.  Unclear cause for symptoms.  Psychogenic causes are a possibility.  MRI spine as noted in Dr. Ferd Glassing note.    Sid Falcon, MD 12/10/2014, 10:31 AM

## 2014-12-10 NOTE — Progress Notes (Signed)
Pt pulled out IV during OT session, unable to obtain IV access prior to transport down to MRI.  IV re-attempted in MRI, unsuccessfully as well.  MRI done withOUT contrast only.

## 2014-12-10 NOTE — Evaluation (Signed)
Physical Therapy Evaluation Patient Details Name: Maureen Swanson MRN: [id number] DOB: [birth year] Today's Date: 12/10/2014   History of Present Illness  49 y.o. female Patient presented with a 1 week hx of left sided weakness, blurry vision, and dizziness. CT and MRI/MRA were negative for stroke. Hx of conversion disorder. Further testing for multiple sclerosis with MRI of spine.  Clinical Impression  Pt admitted with the above complications. Pt currently with functional limitations due to the deficits listed below (see PT Problem List). Min guard for safety with transfers and ambulation up to 50 feet using a rolling walker for support. Significant left foot drop noted with first few steps however she was able to self-correct for remainder of ambulatory bout without cues. No loss of balance although demonstrates mild Lt knee instability at times, she is able to self correct with the support of rolling walker. States she will have 24 hour supervision from family as needed at home. Also reports being under a large amount of stress lately. Maureen Swanson.       Follow Up Recommendations Outpatient PT;Supervision/Assistance - 24 hour    Equipment Recommendations  None recommended by PT    Recommendations for Other Services       Precautions / Restrictions Precautions Precautions: Fall Restrictions Weight Bearing Restrictions: No      Mobility  Bed Mobility Overal bed mobility: Modified Independent             General bed mobility comments: required extra time but no physical assist.  Transfers Overall transfer level: Needs assistance Equipment used: Rolling walker (2 wheeled) Transfers: Sit to/from Stand Sit to Stand: Min guard         General transfer comment: min guard for safety. Slow to rise and bear weight through LLE. No  loss of balance or need for physical assist.   Ambulation/Gait Ambulation/Gait assistance: Min guard Ambulation Distance (Feet): 50 Feet Assistive device: Rolling walker (2 wheeled) Gait Pattern/deviations: Step-through pattern;Decreased dorsiflexion - left;Trunk flexed Gait velocity: slow Gait velocity interpretation: Below normal speed for age/gender General Gait Details: Very slow. Educated on safe DME use with a rolling walker. Close guard for safety. Lt knee buckling at times but able to self correct. Initially demonstrating significant left foot drop but only for the first few steps, correcting this without compensation or cues.  Stairs            Wheelchair Mobility    Modified Rankin (Stroke Patients Only) Modified Rankin (Stroke Patients Only) Pre-Morbid Rankin Score: Moderate disability Modified Rankin: Moderately severe disability     Balance Overall balance assessment: Needs assistance Sitting-balance support: No upper extremity supported;Feet supported Sitting balance-Leahy Scale: Good     Standing balance support: No upper extremity supported Standing balance-Leahy Scale: Fair                               Pertinent Vitals/Pain Pain Assessment: Faces Faces Pain Scale: Hurts little more Pain Location: Left side Pain Descriptors / Indicators: Sore Pain Intervention(s): Monitored during session;Repositioned    Home Living Family/patient expects to be Swanson to:: Private residence Living Arrangements: Children Available Help at Discharge: Family;Available 24 hours/day Type of Home: Apartment Home Access: Level entry     Home Layout: One level Home Equipment: Walker - 2 wheels;Shower seat;Bedside  commode      Prior Function Level of Independence: Needs assistance   Gait / Transfers Assistance Needed: Used RW for short distance in house  ADL's / Homemaking Assistance Needed: Needs assist with bath/dress        Hand Dominance    Dominant Hand: Left    Extremity/Trunk Assessment   Upper Extremity Assessment: Defer to OT evaluation (poor effort with UE testing)           Lower Extremity Assessment: LLE deficits/detail   LLE Deficits / Details: Appears to give poor effort with MMT which is much weaker than what she displays functionally in standing and walking. Reports shooting sensation begins in BIL feet and ascend up legs. Reports this is not a brand new symptom and has been diagnosed with neuropathy in the past..     Communication   Communication: No difficulties  Cognition Arousal/Alertness: Awake/alert Behavior During Therapy: WFL for tasks assessed/performed Overall Cognitive Status: No family/caregiver present to determine baseline cognitive functioning Area of Impairment: Orientation Orientation Level: Time;Disoriented to (thought it was may 2016. It's june)                  General Comments General comments (skin integrity, edema, etc.): States she has been under a great deal of stress lately and she has been told her symptoms are in her head. Encouraged to focus on improving her functional abilities and strength with the goal of becoming independent.    Exercises        Assessment/Plan    PT Assessment Patient needs continued PT services  PT Diagnosis Difficulty walking;Abnormality of gait;Hemiplegia dominant side;Acute pain   PT Problem List Decreased strength;Decreased range of motion;Decreased activity tolerance;Decreased balance;Decreased mobility;Decreased cognition;Decreased knowledge of use of DME;Impaired sensation;Obesity;Pain  PT Treatment Interventions DME instruction;Gait training;Functional mobility training;Therapeutic activities;Therapeutic exercise;Balance training;Neuromuscular re-education;Cognitive remediation;Patient/family education   PT Goals (Current goals can be found in the Care Plan section) Acute Rehab PT Goals Patient Stated Goal: none stated PT Goal  Formulation: With patient Time For Goal Achievement: 12/24/14 Potential to Achieve Goals: Good    Frequency Min 4X/week   Barriers to discharge        Co-evaluation               End of Session Equipment Utilized During Treatment: Gait belt Activity Tolerance: Patient tolerated treatment well Patient left: in bed;with call bell/phone within reach;with bed alarm set Nurse Communication: Mobility status    Functional Assessment Tool Used: clinical observation Functional Limitation: Mobility: Walking and moving around Mobility: Walking and Moving Around Current Status (G9211): At least 1 percent but less than 20 percent impaired, limited or restricted Mobility: Walking and Moving Around Goal Status [phone]): At least 1 percent but less than 20 percent impaired, limited or restricted    Time: 1210-1232 PT Time Calculation (min) (ACUTE ONLY): 22 min   Charges:   PT Evaluation $Initial PT Evaluation Tier I: 1 Procedure     PT G Codes:   PT G-Codes **NOT FOR INPATIENT CLASS** Functional Assessment Tool Used: clinical observation Functional Limitation: Mobility: Walking and moving around Mobility: Walking and Moving Around Current Status (Y8144): At least 1 percent but less than 20 percent impaired, limited or restricted Mobility: Walking and Moving Around Goal Status [phone]): At least 1 percent but less than 20 percent impaired, limited or restricted    Ellouise Newer 12/10/2014, 1:48 PM Camille Bal [place], [place]

## 2014-12-10 NOTE — Progress Notes (Signed)
Subjective: VSS.  No events overnight.  Patient reports she is still experiencing left sided weakness but has improved.  Still has blurry vision.    Her daughter-in-law is concerned about polypharmacy and expresses that some of the medications may be contributing to her symptoms.  I certainly agree this may be the case and we will relay this to her PCP.    Objective: Vital signs in last 24 hours: Filed Vitals:   12/09/14 1742 12/09/14 2124 12/10/14 0130 12/10/14 0601  BP: 107/79 104/59 106/61 110/70  Pulse: 66 66 63 72  Temp: 98 F (36.7 C) 98.3 F (36.8 C) 97.5 F (36.4 C) 98.1 F (36.7 C)  TempSrc: Oral Oral Oral Oral  Resp: 18 19 17 20   Height:      Weight:      SpO2: 100% 98% 98% 94%   Weight change:   Intake/Output Summary (Last 24 hours) at 12/10/14 0755 Last data filed at 12/09/14 1257  Gross per 24 hour  Intake    920 ml  Output      0 ml  Net    920 ml   Physical Exam General: middle aged woman sitting up in bed, NAD HEENT: /AT, EOMI, sclera anicteric, mucus membranes moist CV: RRR, no m/g/r Pulm: CTAB Abd: BS+, soft, non-tender Ext: warm, no edema Neuro: alert and oriented x 3. LUE and LLE have 4/5 strength. Is also demonstrating poor effort and the RUE is also weak now.  Rapid movements with both hands normally.   Lab Results: Basic Metabolic Panel:  Recent Labs Lab 12/08/14 2354 12/09/14 0530 12/10/14 0442  NA  --  139 140  K  --  3.3* 3.3*  CL  --  98* 100*  CO2  --  34* 32  GLUCOSE  --  140* 136*  BUN  --  29* 15  CREATININE  --  1.27* 0.90  CALCIUM  --  9.1 9.4  MG 1.4*  --   --    Liver Function Tests:  Recent Labs Lab 12/08/14 1721  AST 21  ALT 15  ALKPHOS 73  BILITOT 0.2*  PROT 7.2  ALBUMIN 3.9   CBC:  Recent Labs Lab 12/08/14 1721 12/08/14 1735  WBC 7.4  --   NEUTROABS 5.2  --   HGB 10.3* 11.6*  HCT 31.4* 34.0*  MCV 83.7  --   PLT 266  --    Cardiac Enzymes:  Recent Labs Lab 12/08/14 2354 12/10/14 0442    CKTOTAL  --  46  TROPONINI <0.03  --    CBG:  Recent Labs Lab 12/08/14 1735 12/09/14 0634 12/09/14 1124 12/09/14 1635 12/09/14 2128 12/10/14 0640  GLUCAP 111* 132* 188* 155* 169* 188*   Coagulation:  Recent Labs Lab 12/08/14 1721  LABPROT 13.6  INR 1.02   Urine Drug Screen: Drugs of Abuse     Component Value Date/Time   LABOPIA NONE DETECTED 12/08/2014 1809   LABOPIA NEG 10/29/2009 2213   COCAINSCRNUR NONE DETECTED 12/08/2014 1809   COCAINSCRNUR NEG 10/29/2009 2213   LABBENZ NONE DETECTED 12/08/2014 1809   LABBENZ POS* 10/29/2009 2213   AMPHETMU NONE DETECTED 12/08/2014 1809   AMPHETMU NEG 10/29/2009 2213   THCU NONE DETECTED 12/08/2014 1809   LABBARB NONE DETECTED 12/08/2014 1809    Alcohol Level:  Recent Labs Lab 12/08/14 1721  ETH <5   Urinalysis:  Recent Labs Lab 12/08/14 1809  COLORURINE YELLOW  LABSPEC 1.009  PHURINE 6.0  GLUCOSEU NEGATIVE  HGBUR  NEGATIVE  BILIRUBINUR NEGATIVE  KETONESUR NEGATIVE  PROTEINUR NEGATIVE  UROBILINOGEN 0.2  NITRITE NEGATIVE  LEUKOCYTESUR TRACE*   Studies/Results: Ct Head Wo Contrast  12/08/2014   ADDENDUM REPORT: 12/08/2014 17:43  ADDENDUM: The original report was by Dr. Van Clines. The following addendum is by Dr. Van Clines:  These results were called by telephone at the time of interpretation on 12/08/2014 at 5:35pm to Dr. Armida Sans, who verbally acknowledged these results.   Electronically Signed   By: Van Clines M.D.   On: 12/08/2014 17:43   12/08/2014   CLINICAL DATA:  Left lower extremity weakness and slurred speech  EXAM: CT HEAD WITHOUT CONTRAST  TECHNIQUE: Contiguous axial images were obtained from the base of the skull through the vertex without intravenous contrast.  COMPARISON:  11/08/2014  FINDINGS: The brainstem, cerebellum, cerebral peduncles, thalamus, basal ganglia, basilar cisterns, and ventricular system appear within normal limits. No intracranial hemorrhage, mass lesion, or  acute CVA.  IMPRESSION: No significant abnormality identified.  Electronically Signed: By: Van Clines M.D. On: 12/08/2014 17:27   Mr Jodene Nam Head Wo Contrast  12/08/2014   CLINICAL DATA:  Worsening left leg weakness. Slurred speech and blurred vision for 2 weeks. Prior stroke with left-sided deficit. New onset altered gait. History of hypertension, diabetes, and hypercholesterolemia.  EXAM: MRI HEAD WITHOUT CONTRAST  MRA HEAD WITHOUT CONTRAST  TECHNIQUE: Multiplanar, multiecho pulse sequences of the brain and surrounding structures were obtained without intravenous contrast. Angiographic images of the head were obtained using MRA technique without contrast.  COMPARISON:  Head CT 12/08/2014, MRI 11/08/2014, and MRA 11/03/2014  FINDINGS: MRI HEAD FINDINGS  Some sequences are mildly to moderately degraded by motion artifact.  A partially empty sella is again noted. There is no evidence of acute infarct, intracranial hemorrhage, mass, midline shift, or extra-axial fluid collection. Small foci of T2 hyperintensity in the white matter of the parietal lobes are unchanged from the prior MRI and nonspecific but compatible with very mild chronic small vessel ischemic disease. Ventricles and sulci are normal.  Orbits are unremarkable. Paranasal sinuses and mastoid air cells are clear. Major intracranial vascular flow voids are preserved.  MRA HEAD FINDINGS  Images are mildly to moderately degraded by motion artifact.  Visualized distal vertebral arteries are patent and tortuous without evidence of significant stenosis. PICA and SCA origins are patent. Basilar artery is patent with motion artifact proximally but no evidence of significant stenosis. Posterior communicating arteries are not clearly identified. PCAs are patent without evidence of significant stenosis.  Visualized distal cervical ICA on the left is tortuous. Intracranial ICAs are patent without evidence of significant stenosis. ACAs and MCAs are patent  without evidence of stenosis. No intracranial aneurysm is identified.  IMPRESSION: 1. No acute intracranial abnormality. 2. Very mild chronic small vessel ischemic disease in the cerebral white matter. 3. Negative head MRA allowing for motion artifact.   Electronically Signed   By: Logan Bores   On: 12/08/2014 19:46   Mr Brain Wo Contrast  12/08/2014   CLINICAL DATA:  Worsening left leg weakness. Slurred speech and blurred vision for 2 weeks. Prior stroke with left-sided deficit. New onset altered gait. History of hypertension, diabetes, and hypercholesterolemia.  EXAM: MRI HEAD WITHOUT CONTRAST  MRA HEAD WITHOUT CONTRAST  TECHNIQUE: Multiplanar, multiecho pulse sequences of the brain and surrounding structures were obtained without intravenous contrast. Angiographic images of the head were obtained using MRA technique without contrast.  COMPARISON:  Head CT 12/08/2014, MRI 11/08/2014, and MRA 11/03/2014  FINDINGS: MRI HEAD FINDINGS  Some sequences are mildly to moderately degraded by motion artifact.  A partially empty sella is again noted. There is no evidence of acute infarct, intracranial hemorrhage, mass, midline shift, or extra-axial fluid collection. Small foci of T2 hyperintensity in the white matter of the parietal lobes are unchanged from the prior MRI and nonspecific but compatible with very mild chronic small vessel ischemic disease. Ventricles and sulci are normal.  Orbits are unremarkable. Paranasal sinuses and mastoid air cells are clear. Major intracranial vascular flow voids are preserved.  MRA HEAD FINDINGS  Images are mildly to moderately degraded by motion artifact.  Visualized distal vertebral arteries are patent and tortuous without evidence of significant stenosis. PICA and SCA origins are patent. Basilar artery is patent with motion artifact proximally but no evidence of significant stenosis. Posterior communicating arteries are not clearly identified. PCAs are patent without evidence of  significant stenosis.  Visualized distal cervical ICA on the left is tortuous. Intracranial ICAs are patent without evidence of significant stenosis. ACAs and MCAs are patent without evidence of stenosis. No intracranial aneurysm is identified.  IMPRESSION: 1. No acute intracranial abnormality. 2. Very mild chronic small vessel ischemic disease in the cerebral white matter. 3. Negative head MRA allowing for motion artifact.   Electronically Signed   By: Logan Bores   On: 12/08/2014 19:46   Medications: I have reviewed the patient's current medications. Scheduled Meds: . aspirin EC  81 mg Oral Daily  . atenolol  100 mg Oral Daily  . atorvastatin  40 mg Oral Daily  . folic acid  1 mg Oral Daily  . heparin  5,000 Units Subcutaneous 3 times per day  . insulin aspart  0-15 Units Subcutaneous TID WC  . loratadine  10 mg Oral Daily  . multivitamin with minerals  1 tablet Oral Daily  . oxcarbazepine  600 mg Oral BID  . pantoprazole  40 mg Oral Daily  . polyethylene glycol  17 g Oral BID  . QUEtiapine  400 mg Oral Q2000  . sodium chloride  3 mL Intravenous Q12H  . thiamine  100 mg Oral Daily   Or  . thiamine  100 mg Intravenous Daily   Continuous Infusions:   PRN Meds:.acetaminophen, albuterol, LORazepam **OR** LORazepam Assessment/Plan:  ?Psychogenic left sided weakness  Patient presented with a 1 week hx of left sided weakness, blurry vision, and dizziness. CT and MRI/MRA were negative for stroke. Patient noted to have an admission at [facility] from 11/03/14-11/08/14 for blurred vision, diplopia, and left sided weakness. She received tPA during this admission as stroke was suspected. However, imaging at that time was negative for stroke as well. Imaging only showed chronic small vessel disease which is consistent with most recent scans yesterday. The etiology of her symptoms was not determined. She was then admitted to physical medicine and rehab at [facility] from 11/08/14-11/13/14 and noted to have conversion  disorder listed on her discharge summary from there. However, I do not see conversion disorder listed on the hospital d/c summary. Conversion disorder is one possibility to explain her symptoms, along with other psychiatric illness, but other etiologies should be ruled out first. Another possibility is MS given the waxing/waning of her symptoms and she did have diplopia on her initial admission for these symptoms. However, MRI does not show any lesions consistent with this diagnosis. Stroke has been ruled out and neuro signed off likely psychogenic.   -will obtain MRI of spine to assist in r/o MS  -  cont to monitor  -avoid polypharmacy   HTN BP stable in [id number] systolic. She takes Atenolol 100mg  daily, Lasix 40mg  daily, HCTZ 25mg  daily, and Hydralazine 25mg  TID daily at home.  -d/c atenolol for BP (not a good choice for someone with depression) -should be on an ACEi with DMII, but normotensive for now   Type 2 DM Last HbA1c 6.8 on 11/04/14. Patient takes Glipizide 10 mg daily and Metformin 1000 mg BID. -SSI  Hyperlipidemia Last lipid panel on 11/07/14: Chol 213, Trigly 137, HDL 61, and LDL 125. She takes Atorvastatin 40 mg daily at home. -cont home Atorvastatin   Depression, Hx Suicide Attempt, Schizoaffective Disorder, Anxiety Patient with an extensive psychiatric history. She was last hospitalized at [facility] from 07/21/14-08/02/14 for suicidal/homicidal ideation after reporting she overdosed on her Seroquel and Trileptal. She has also cut her wrists in the past. She takes Trileptal 600 mg BID, Seroquel 600 mg daily, Celexa 20 mg daily, and Hydroxyzine 25 mg Q6H PRN at home. She denies SI/HI currently.  She has been diagnosed with conversion disorder in the past.  -psych d/c celexa and decreased  seroquel to 400mg  d/t restlessness and akathisia  -d/c suicide precautions -avoid polypharmacy   Asthma -cont albuterol   GERD -cont protonix 40 mg daily   Hx Alcohol Abuse Her last reported drink  was in Feb 2016. - CIWA - MVI, thiamine, folate supplementation  AKI Resolved.   Diet: Heart healthy/Carb modified  VTE PPx: Heparin SQ Dispo: Disposition is deferred at this time, awaiting improvement of current medical problems.  Anticipated discharge in approximately 1-2 day(s).   The patient does have a current PCP Elbert Ewings, FNP) and does need an [facility] hospital follow-up appointment after discharge.  The patient does not have transportation limitations that hinder transportation to clinic appointments.    LOS: 2 days   Jones Bales, MD 12/10/2014, 7:55 AM

## 2014-12-11 ENCOUNTER — Other Ambulatory Visit (HOSPITAL_COMMUNITY): Payer: Self-pay | Admitting: *Deleted

## 2014-12-11 DIAGNOSIS — N644 Mastodynia: Secondary | ICD-10-CM

## 2014-12-11 DIAGNOSIS — F444 Conversion disorder with motor symptom or deficit: Secondary | ICD-10-CM

## 2014-12-11 LAB — GLUCOSE, CAPILLARY
GLUCOSE-CAPILLARY: 148 mg/dL — AB (ref 65–99)
GLUCOSE-CAPILLARY: 177 mg/dL — AB (ref 65–99)

## 2014-12-11 MED ORDER — ADULT MULTIVITAMIN W/MINERALS CH: 1.0000 | ORAL_TABLET | Freq: Every day | ORAL | Status: DC

## 2014-12-11 MED ORDER — OXCARBAZEPINE 600 MG PO TABS: 600.0000 mg | ORAL_TABLET | Freq: Two times a day (BID) | ORAL | Status: DC

## 2014-12-11 MED ORDER — QUETIAPINE FUMARATE ER 400 MG PO TB24: 400.0000 mg | ORAL_TABLET | Freq: Every day | ORAL | Status: DC

## 2014-12-11 MED ORDER — FOLIC ACID 1 MG PO TABS: 1.0000 mg | ORAL_TABLET | Freq: Every day | ORAL | Status: DC

## 2014-12-11 MED ORDER — POTASSIUM CHLORIDE CRYS ER 20 MEQ PO TBCR
40.0000 meq | EXTENDED_RELEASE_TABLET | Freq: Two times a day (BID) | ORAL | Status: DC
  Administered 2014-12-11: 40 meq via ORAL
  Filled 2014-12-11: qty 2

## 2014-12-11 MED ORDER — THIAMINE HCL 100 MG PO TABS: 100.0000 mg | ORAL_TABLET | Freq: Every day | ORAL | Status: DC

## 2014-12-11 NOTE — Progress Notes (Signed)
Subjective: No acute events overnight. Patient states she is doing well, wants to go home. Per PT, she was able to ambulate the hall with a rolling walker.   Objective: Vital signs in last 24 hours: Filed Vitals:   12/10/14 1414 12/10/14 2102 12/11/14 0121 12/11/14 0550  BP: 116/77 97/73 119/74 124/80  Pulse: 71 76 79 70  Temp: 98.1 F (36.7 C) 97.9 F (36.6 C) 98.2 F (36.8 C) 98.6 F (37 C)  TempSrc: Oral Oral Oral Oral  Resp: 17 20 18 18   Height:      Weight:      SpO2: 98% 99% 98% 97%   Weight change:  No intake or output data in the 24 hours ending 12/11/14 1055  Physical Exam General: sitting up in chair, NAD HEENT: [place]/AT, EOMI, sclera anicteric, mucus membranes moist CV: RRR, no m/g/r Pulm: CTA bilaterally, breaths non-labored Abd: BS+, soft, non-tender Ext: warm, no edema Neuro: alert and oriented x 3. Strength 4/5 in LUE and LLE, 5/5 in other extremities.   Lab Results: Basic Metabolic Panel:  Recent Labs Lab 12/08/14 2354 12/09/14 0530 12/10/14 0442 12/10/14 0815  NA  --  139 140  --   K  --  3.3* 3.3*  --   CL  --  98* 100*  --   CO2  --  34* 32  --   GLUCOSE  --  140* 136*  --   BUN  --  29* 15  --   CREATININE  --  1.27* 0.90  --   CALCIUM  --  9.1 9.4  --   MG 1.4*  --   --  1.5*   Liver Function Tests:  Recent Labs Lab 12/08/14 1721  AST 21  ALT 15  ALKPHOS 73  BILITOT 0.2*  PROT 7.2  ALBUMIN 3.9   CBC:  Recent Labs Lab 12/08/14 1721 12/08/14 1735  WBC 7.4  --   NEUTROABS 5.2  --   HGB 10.3* 11.6*  HCT 31.4* 34.0*  MCV 83.7  --   PLT 266  --    Cardiac Enzymes:  Recent Labs Lab 12/08/14 2354 12/10/14 0442  CKTOTAL  --  46  TROPONINI <0.03  --    CBG:  Recent Labs Lab 12/09/14 2128 12/10/14 0640 12/10/14 1138 12/10/14 1852 12/10/14 2107 12/11/14 0644  GLUCAP 169* 188* 136* 151* 194* 148*   Coagulation:  Recent Labs Lab 12/08/14 1721  LABPROT 13.6  INR 1.02   Urine Drug Screen: Drugs of Abuse     Component Value Date/Time   LABOPIA NONE DETECTED 12/08/2014 1809   LABOPIA NEG 10/29/2009 2213   COCAINSCRNUR NONE DETECTED 12/08/2014 1809   COCAINSCRNUR NEG 10/29/2009 2213   LABBENZ NONE DETECTED 12/08/2014 1809   LABBENZ POS* 10/29/2009 2213   AMPHETMU NONE DETECTED 12/08/2014 1809   AMPHETMU NEG 10/29/2009 2213   THCU NONE DETECTED 12/08/2014 1809   LABBARB NONE DETECTED 12/08/2014 1809    Alcohol Level:  Recent Labs Lab 12/08/14 1721  ETH <5   Urinalysis:  Recent Labs Lab 12/08/14 1809  COLORURINE YELLOW  LABSPEC 1.009  PHURINE 6.0  GLUCOSEU NEGATIVE  HGBUR NEGATIVE  BILIRUBINUR NEGATIVE  KETONESUR NEGATIVE  PROTEINUR NEGATIVE  UROBILINOGEN 0.2  NITRITE NEGATIVE  LEUKOCYTESUR TRACE*   Studies/Results: Mr Cervical Spine Wo Contrast  12/10/2014   CLINICAL DATA:  ACUTE ONSET OF LEFT LEG WEAKNESS TODAY. DIZZINESS. SLURRED SPEECH AND BLURRED VISION.  EXAM: MRI CERVICAL SPINE WITHOUT CONTRAST  TECHNIQUE: Multiplanar, multisequence  MR imaging of the cervical spine was performed. No intravenous contrast was administered.  COMPARISON:  None.  FINDINGS: The visualized intracranial contents, cervical spinal cord, and paraspinal soft tissues are normal. No foraminal or spinal stenosis. No facet arthritis.  C1-2 through C3-4:  Normal.  C4-5: Minimal broad-based disc bulge with no neural impingement. Otherwise normal.  C5-6: Minimal broad-based disc bulge with no neural impingement. Otherwise normal.  C6-7 through T2-3: Normal.  IMPRESSION: Normal MRI of the cervical spine except for minimal degenerative disc disease at C4-5 and C5-6.   Electronically Signed   By: Lorriane Shire M.D.   On: 12/10/2014 18:35   Mr Thoracic Spine Wo Contrast  12/10/2014   CLINICAL DATA:  Acute onset of left leg weakness today.  EXAM: MRI THORACIC SPINE WITHOUT CONTRAST  TECHNIQUE: Multiplanar, multisequence MR imaging of the thoracic spine was performed. No intravenous contrast was administered.   COMPARISON:  None.  FINDINGS: Normal conus tip at L1-2. Thoracic spinal cord is normal. Discs throughout the thoracic spine are normal. There is no spinal or foraminal stenosis. Paraspinal soft tissues are normal. No fracture or bone destruction.  IMPRESSION: Normal MRI of the thoracic spine.   Electronically Signed   By: Lorriane Shire M.D.   On: 12/10/2014 18:46   Mr Lumbar Spine Wo Contrast  12/10/2014   CLINICAL DATA:  Acute onset of left leg weakness.  EXAM: MRI LUMBAR SPINE WITHOUT CONTRAST  TECHNIQUE: Multiplanar, multisequence MR imaging of the lumbar spine was performed. No intravenous contrast was administered.  COMPARISON:  CT scan of the abdomen and pelvis dated 11/23/2014  FINDINGS: Normal conus tip at L1-2.  Normal paraspinal soft tissues.  T11-12 through L2-3:  Normal.  L3-4: Small disc bulge into the right neural foramen. Small protrusion into the left neural foramen. No neural impingement.  L4-5: Small protrusions into both neural foramina without neural impingement. Minimal degenerative changes of the facet joints. Slight compression of the thecal sac.  L5-S1: Small broad-based soft disc protrusion which symmetrically compresses the thecal sac and slightly deviates both S1 nerve root sleeves posteriorly.  IMPRESSION: 1. Disc protrusion at L5-S1 with a mass effect on both S1 nerve root sleeves. 2. Foraminal disc protrusions bilaterally at L4-5 without neural impingement. Slight narrowing of the spinal canal. 3. Small disc protrusion at L3-4 into the left neural foramen without neural impingement.   Electronically Signed   By: Lorriane Shire M.D.   On: 12/10/2014 18:40   Medications: I have reviewed the patient's current medications. Scheduled Meds: . aspirin EC  81 mg Oral Daily  . atorvastatin  40 mg Oral Daily  . enoxaparin (LOVENOX) injection  50 mg Subcutaneous Q24H  . folic acid  1 mg Oral Daily  . insulin aspart  0-15 Units Subcutaneous TID WC  . loratadine  10 mg Oral Daily  .  multivitamin with minerals  1 tablet Oral Daily  . oxcarbazepine  600 mg Oral BID  . pantoprazole  40 mg Oral Daily  . polyethylene glycol  17 g Oral BID  . potassium chloride  40 mEq Oral BID  . QUEtiapine  400 mg Oral Q2000  . sodium chloride  3 mL Intravenous Q12H  . thiamine  100 mg Oral Daily   Continuous Infusions:  PRN Meds:.acetaminophen, albuterol, LORazepam **OR** LORazepam Assessment/Plan:  ?Psychogenic left sided weakness: Work up for stroke negative (CT/MRI head). MS does not seem likely as MRI head and spine negative for plaques. A neuromuscular disorder seems less likely  as well since CK, aldolase, and LDH were normal. 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 She was able to ambulate the hall safely with PT this morning using a rolling walker. Patient has asked for a cane to use at home. She will need follow up with her PCP and psychiatrist (Dr. Audie Box).  - Discharge home - DME cane ordered - Will need follow up with PCP and psych - Will discontinue any unnecessary meds to avoid polypharmacy  HTN: BP stable in 410V-013H systolic. She takes Atenolol 100mg  daily, Lasix 40mg  daily, HCTZ 25mg  daily, and Hydralazine 25mg  TID daily at home. Atenolol was discontinued as this is not a good choice for depression.  - Hold BP meds given normotensive - Should be on an ACEi with DMII, but normotensive for now. Can start low dose ACE as outpatient.   Type 2 DM: Last HbA1c 6.8 on 11/04/14. Patient takes Glipizide 10 mg daily and Metformin 1000 mg BID. - Continue moderate ISS - CBGs 4 times daily   Hyperlipidemia: Last lipid panel on 11/07/14: Chol 213, Trigly 137, HDL 61, and LDL 125. She takes Atorvastatin 40 mg daily at home. - Continue home Atorvastatin   Depression, Hx Suicide Attempt, Schizoaffective Disorder, Anxiety: Patient with an extensive psychiatric history. She was last hospitalized at [facility] from 07/21/14-08/02/14 for suicidal/homicidal ideation  after reporting she overdosed on her Seroquel and Trileptal. She has also cut her wrists in the past. She takes Trileptal 600 mg BID, Seroquel 600 mg daily, Celexa 20 mg daily, and Hydroxyzine 25 mg Q6H PRN at home. She denies SI/HI currently. She has been diagnosed with conversion disorder in the past.  - Continue Seroquel 400 mg daily (decreased due to restlessness and akathisia) - Continue Trileptal 600 mg BID, carbamazepine level subtherapeutic at < 2.0 - Avoid polypharmacy  Asthma - Continue Albuterol   GERD - Continue Protonix 40 mg daily   Hx Alcohol Abuse: Her last reported drink was in Feb 2016. - CIWA - MVI, thiamine, folate supplementation  AKI Resolved.   Diet: Heart healthy VTE PPx: Lovenox SQ Dispo: Discharge today  The patient does have a current PCP Elbert Ewings, FNP) and does need an [facility] hospital follow-up appointment after discharge.  The patient does not have transportation limitations that hinder transportation to clinic appointments.  .Services Needed at time of discharge: Y = Yes, Blank = No PT:   OT:   RN:   Equipment:   Other:     LOS: 3 days   Juliet Rude, MD 12/11/2014, 10:55 AM

## 2014-12-11 NOTE — Discharge Instructions (Signed)
Please take a look at your medication list as there have been some changes.  Make sure to take Seroquel 400 mg daily and Trileptal 600 mg twice a day.  Please follow up with both your primary care doctor and your psychiatrist in the next week.

## 2014-12-11 NOTE — Progress Notes (Signed)
Physical Therapy Treatment Patient Details Name: Maureen Swanson MRN: [id number] DOB: [birth year] Today's Date: 12/11/2014    History of Present Illness 49 y.o. female Patient presented with a 1 week hx of left sided weakness, blurry vision, and dizziness. CT and MRI/MRA were negative for stroke. Hx of conversion disorder. Further testing for multiple sclerosis with MRI of spine.    PT Comments    Pt with improved ambulation tolerance with RW this date. Progressed to cane but can only use safely in household distances. Cont' to recommend 24/7 supervision for safe d/c.   Follow Up Recommendations  Outpatient PT;Supervision/Assistance - 24 hour     Equipment Recommendations  Cane    Recommendations for Other Services       Precautions / Restrictions Precautions Precautions: Fall Restrictions Weight Bearing Restrictions: No    Mobility  Bed Mobility Overal bed mobility: Modified Independent             General bed mobility comments: pt up in chair  Transfers Overall transfer level: Needs assistance Equipment used: Rolling walker (2 wheeled) Transfers: Sit to/from Stand Sit to Stand: Supervision         General transfer comment: pt with good use of hands pushing up from chair  Ambulation/Gait Ambulation/Gait assistance: Min guard Ambulation Distance (Feet): 100 Feet Assistive device: Rolling walker (2 wheeled) (trialed cane as well) Gait Pattern/deviations: Step-through pattern;Decreased stride length;Wide base of support Gait velocity: slow Gait velocity interpretation: Below normal speed for age/gender General Gait Details: trialed cane however unable to amb safely > 40 feet. recommend RW for long distance ambulation. pt agreed. pt with no episode of LOB but is unsteady without AD   Stairs            Wheelchair Mobility    Modified Rankin (Stroke Patients Only) Modified Rankin (Stroke Patients Only) Pre-Morbid Rankin Score: Moderate  disability Modified Rankin: Moderately severe disability     Balance Overall balance assessment: Needs assistance                                  Cognition Arousal/Alertness: Awake/alert Behavior During Therapy: Flat affect Overall Cognitive Status: Within Functional Limits for tasks assessed                      Exercises Other Exercises Other Exercises:  (pt refused)    General Comments        Pertinent Vitals/Pain Pain Assessment: No/denies pain    Home Living                      Prior Function            PT Goals (current goals can now be found in the care plan section) Acute Rehab PT Goals Patient Stated Goal: home today Progress towards PT goals: Progressing toward goals    Frequency  Min 3X/week    PT Plan Frequency needs to be updated    Co-evaluation             End of Session           Time: 5681-2751 PT Time Calculation (min) (ACUTE ONLY): 13 min  Charges:  $Gait Training: 8-22 mins                    G Codes:      Kingsley Callander 12/11/2014, 10:06 AM   Leatrice Jewels  Marcia Hartwell, PT, DPT Pager #: [phone] Office #: [phone]

## 2014-12-11 NOTE — Progress Notes (Signed)
Discharge order received. VSS. Discharge instructions provided with verbalize understanding. Transported via wheelchair with family.   Docia Barrier, RN

## 2014-12-11 NOTE — Progress Notes (Signed)
Occupational Therapy Treatment Patient Details Name: ANESHIA JACQUET MRN: [id number] DOB: 1965/10/09 Today's Date: 12/11/2014    History of present illness 49 y.o. female Patient presented with a 1 week hx of left sided weakness, blurry vision, and dizziness. CT and MRI/MRA were negative for stroke. Hx of conversion disorder. Further testing for multiple sclerosis with MRI of spine.   OT comments  Pt with good return demo of fine motor exercises. ( red theraputty, red foam sock aide and brown foam present in room) Pt has family (A) at home for x2 dogs. Educated on dogs being put outside until entering home for safety.  Follow Up Recommendations  No OT follow up    Equipment Recommendations  None recommended by OT    Recommendations for Other Services      Precautions / Restrictions Precautions Precautions: Fall       Mobility Bed Mobility Overal bed mobility: Modified Independent                Transfers Overall transfer level: Modified independent Equipment used: Rolling walker (2 wheeled) Transfers: Sit to/from Stand                Balance                                   ADL Overall ADL's : Needs assistance/impaired                                 Tub/ Shower Transfer: Supervision/safety   Functional mobility during ADLs: Supervision/safety General ADL Comments: pt able to return demo fine motor task however needed reeducation on when and how often to complete task . pt very eager to d/c home today. Pt with flat affect and incr affect when discussing dogs ( [place] and [place]). Pt required rest break with ambulation to gym      Vision                     Perception     Praxis      Cognition   Behavior During Therapy: [facility] for tasks assessed/performed Overall Cognitive Status: Within Functional Limits for tasks assessed                       Extremity/Trunk Assessment               Exercises      Shoulder Instructions       General Comments      Pertinent Vitals/ Pain       Pain Assessment: No/denies pain  Home Living                                          Prior Functioning/Environment              Frequency Min 2X/week     Progress Toward Goals  OT Goals(current goals can now be found in the care plan section)  Progress towards OT goals: Progressing toward goals  Acute Rehab OT Goals Patient Stated Goal: home today or tomorrow hopefully OT Goal Formulation: With patient Time For Goal Achievement: 12/17/14 Potential to Achieve Goals: Good ADL Goals Pt Will Perform Tub/Shower Transfer: Tub transfer;with supervision;ambulating;rolling walker;shower seat Pt/caregiver  will Perform Home Exercise Program: Increased strength;Left upper extremity;With theraputty;With written HEP provided  Plan Discharge plan remains appropriate    Co-evaluation                 End of Session Equipment Utilized During Treatment: Rolling walker   Activity Tolerance Patient tolerated treatment well   Patient Left in chair;with call bell/phone within reach;with chair alarm set   Nurse Communication Mobility status    Functional Assessment Tool Used: Clinical observation Functional Limitation: Self care Self Care Current Status (M0867): At least 1 percent but less than 20 percent impaired, limited or restricted Self Care Goal Status (Y1950): At least 1 percent but less than 20 percent impaired, limited or restricted Self Care Discharge Status [phone]): At least 1 percent but less than 20 percent impaired, limited or restricted   Time: 0820-0843 OT Time Calculation (min): 23 min  Charges: OT G-codes **NOT FOR INPATIENT CLASS** Functional Assessment Tool Used: Clinical observation Functional Limitation: Self care Self Care Current Status (T2458): At least 1 percent but less than 20 percent impaired, limited or restricted Self Care Goal Status (K9983):  At least 1 percent but less than 20 percent impaired, limited or restricted Self Care Discharge Status [phone]): At least 1 percent but less than 20 percent impaired, limited or restricted OT General Charges $OT Visit: 1 Procedure OT Treatments $Self Care/Home Management : 8-22 mins $Therapeutic Activity: 8-22 mins  Peri Maris 12/11/2014, 8:46 AM  Pager: [phone]

## 2014-12-11 NOTE — Discharge Summary (Signed)
Name: Maureen Swanson MRN: [id number] DOB: [birth year] 49 y.o. PCP: Elbert Ewings, FNP  Date of Admission: 12/08/2014  5:15 PM Date of Discharge: 12/11/2014 Attending Physician: Dr. Beryle Beams   Discharge Diagnosis: Principal Problem Psychogenic Left-sided Weakness Active Problems HTN Type 2 DM Hyperlipidemia Depression Hx Suicide Attempt Schizoaffective Disorder Anxiety Asthma GERD Hx Alcohol Abuse   Discharge Medications:   Medication List    STOP taking these medications        atenolol 100 MG tablet  Commonly known as:  TENORMIN     baclofen 10 MG tablet  Commonly known as:  LIORESAL     celecoxib 100 MG capsule  Commonly known as:  CELEBREX     ciprofloxacin 500 MG tablet  Commonly known as:  CIPRO     citalopram 20 MG tablet  Commonly known as:  CELEXA     DEXILANT 30 MG capsule  Generic drug:  Dexlansoprazole     furosemide 40 MG tablet  Commonly known as:  LASIX     gabapentin 400 MG capsule  Commonly known as:  NEURONTIN     hydrALAZINE 25 MG tablet  Commonly known as:  APRESOLINE     hydrochlorothiazide 25 MG tablet  Commonly known as:  HYDRODIURIL     HYDROcodone-acetaminophen 5-325 MG per tablet  Commonly known as:  NORCO/VICODIN     hydrOXYzine 25 MG tablet  Commonly known as:  ATARAX/VISTARIL     loratadine 10 MG tablet  Commonly known as:  CLARITIN     metoCLOPramide 10 MG tablet  Commonly known as:  REGLAN     nicotine 21 mg/24hr patch  Commonly known as:  NICODERM CQ - dosed in mg/24 hours     ondansetron 4 MG disintegrating tablet  Commonly known as:  ZOFRAN ODT     pantoprazole 20 MG tablet  Commonly known as:  PROTONIX     polyethylene glycol powder powder  Commonly known as:  GLYCOLAX/MIRALAX     ranitidine 150 MG tablet  Commonly known as:  ZANTAC      TAKE these medications        albuterol 108 (90 BASE) MCG/ACT inhaler  Commonly known as:  PROVENTIL HFA;VENTOLIN HFA  Inhale 2 puffs into the lungs every  6 (six) hours as needed for wheezing or shortness of breath.     aspirin 81 MG tablet  Take 81 mg by mouth daily.     atorvastatin 40 MG tablet  Commonly known as:  LIPITOR  Take 40 mg by mouth daily.     cetirizine 10 MG tablet  Commonly known as:  ZYRTEC  Take 10 mg by mouth daily.     folic acid 1 MG tablet  Commonly known as:  FOLVITE  Take 1 tablet (1 mg total) by mouth daily.     glipiZIDE 10 MG tablet  Commonly known as:  GLUCOTROL  Take 10 mg by mouth daily before breakfast.     indomethacin 25 MG capsule  Commonly known as:  INDOCIN  Take 25 mg by mouth 3 (three) times daily as needed for mild pain.     metFORMIN 1000 MG tablet  Commonly known as:  GLUCOPHAGE  Take 1,000 mg by mouth 2 (two) times daily with a meal.     multivitamin with minerals Tabs tablet  Take 1 tablet by mouth daily.     naltrexone 50 MG tablet  Commonly known as:  DEPADE  Take 50 mg by mouth daily.  omeprazole 20 MG capsule  Commonly known as:  PRILOSEC  Take 1 capsule (20 mg total) by mouth daily. For GERD     oxcarbazepine 600 MG tablet  Commonly known as:  TRILEPTAL  Take 1 tablet (600 mg total) by mouth 2 (two) times daily. For mood stabilization     OYSCO 500 500 MG Tabs  Generic drug:  Oyster Shell Calcium  Take 1 tablet by mouth daily.     potassium chloride SA 20 MEQ tablet  Commonly known as:  K-DUR,KLOR-CON  Take 1 tablet (20 mEq total) by mouth daily.     QUEtiapine 400 MG 24 hr tablet  Commonly known as:  SEROQUEL XR  Take 1 tablet (400 mg total) by mouth daily at 8 pm.     thiamine 100 MG tablet  Take 1 tablet (100 mg total) by mouth daily.        Disposition and follow-up:   Maureen Swanson was discharged from [facility] in Good condition.  At the hospital follow up visit please address:  1.  Left-sided Weakness- Is pt still having symptoms? Is she able to ambulate safely?  HTN- Pt needs BP recheck. Instructed to stop all meds given she  was normotensive. She would benefit from an ACE inhibitor given her diabetes hx.  Depression/Schizoaffective Disorder- Changes were made to her psychiatric meds. Seroquel was decreased to 400 mg daily and Celexa and Hydroxyzine were discontinued. Trileptal was continued as 600 mg BID.   2.  Labs / imaging needed at time of follow-up: bmet  3.  Pending labs/ test needing follow-up: None  Follow-up Appointments:     Follow-up Information    Follow up with [facility], [place].   Specialty:  Nurse Practitioner   Why:  Please see your primary care doctor in the next week.   Contact information:   [place] [id number] [phone]       Follow up with Debbora Dus C.   Specialty:  Nurse Practitioner   Why:  Please follow up with psychiatry in the next 1 week   Contact information:   [place] [address] [phone]       Discharge Instructions: Discharge Instructions    Diet - low sodium heart healthy    Complete by:  As directed      Increase activity slowly    Complete by:  As directed            Consultations: Treatment Team:  Ambrose Finland, MD  Procedures Performed:  Dg Chest 2 View  11/23/2014   CLINICAL DATA:  Abdominal pain, diarrhea, nausea and cough. History of asthma.  EXAM: CHEST - 2 VIEW  COMPARISON:  11/03/2014  FINDINGS: The heart size and mediastinal contours are within normal limits. Lung volumes are low bilaterally. There is mild bibasilar atelectasis. There is no evidence of pulmonary edema, consolidation, pneumothorax, nodule or pleural fluid. The visualized skeletal structures are unremarkable.  IMPRESSION: No active disease.   Electronically Signed   By: Aletta Edouard M.D.   On: 11/23/2014 12:45   Ct Head Wo Contrast  12/08/2014   ADDENDUM REPORT: 12/08/2014 17:43  ADDENDUM: The original report was by Dr. Van Clines. The following addendum is by Dr. Van Clines:  These results were  called by telephone at the time of interpretation on 12/08/2014 at 5:35pm to Dr. Armida Sans, who verbally acknowledged these results.   Electronically Signed   By: Van Clines  M.D.   On: 12/08/2014 17:43   12/08/2014   CLINICAL DATA:  Left lower extremity weakness and slurred speech  EXAM: CT HEAD WITHOUT CONTRAST  TECHNIQUE: Contiguous axial images were obtained from the base of the skull through the vertex without intravenous contrast.  COMPARISON:  11/08/2014  FINDINGS: The brainstem, cerebellum, cerebral peduncles, thalamus, basal ganglia, basilar cisterns, and ventricular system appear within normal limits. No intracranial hemorrhage, mass lesion, or acute CVA.  IMPRESSION: No significant abnormality identified.  Electronically Signed: By: Van Clines M.D. On: 12/08/2014 17:27   Mr Jodene Nam Head Wo Contrast  12/08/2014   CLINICAL DATA:  Worsening left leg weakness. Slurred speech and blurred vision for 2 weeks. Prior stroke with left-sided deficit. New onset altered gait. History of hypertension, diabetes, and hypercholesterolemia.  EXAM: MRI HEAD WITHOUT CONTRAST  MRA HEAD WITHOUT CONTRAST  TECHNIQUE: Multiplanar, multiecho pulse sequences of the brain and surrounding structures were obtained without intravenous contrast. Angiographic images of the head were obtained using MRA technique without contrast.  COMPARISON:  Head CT 12/08/2014, MRI 11/08/2014, and MRA 11/03/2014  FINDINGS: MRI HEAD FINDINGS  Some sequences are mildly to moderately degraded by motion artifact.  A partially empty sella is again noted. There is no evidence of acute infarct, intracranial hemorrhage, mass, midline shift, or extra-axial fluid collection. Small foci of T2 hyperintensity in the white matter of the parietal lobes are unchanged from the prior MRI and nonspecific but compatible with very mild chronic small vessel ischemic disease. Ventricles and sulci are normal.  Orbits are unremarkable. Paranasal sinuses and mastoid  air cells are clear. Major intracranial vascular flow voids are preserved.  MRA HEAD FINDINGS  Images are mildly to moderately degraded by motion artifact.  Visualized distal vertebral arteries are patent and tortuous without evidence of significant stenosis. PICA and SCA origins are patent. Basilar artery is patent with motion artifact proximally but no evidence of significant stenosis. Posterior communicating arteries are not clearly identified. PCAs are patent without evidence of significant stenosis.  Visualized distal cervical ICA on the left is tortuous. Intracranial ICAs are patent without evidence of significant stenosis. ACAs and MCAs are patent without evidence of stenosis. No intracranial aneurysm is identified.  IMPRESSION: 1. No acute intracranial abnormality. 2. Very mild chronic small vessel ischemic disease in the cerebral white matter. 3. Negative head MRA allowing for motion artifact.   Electronically Signed   By: Logan Bores   On: 12/08/2014 19:46   Mr Brain Wo Contrast  12/08/2014   CLINICAL DATA:  Worsening left leg weakness. Slurred speech and blurred vision for 2 weeks. Prior stroke with left-sided deficit. New onset altered gait. History of hypertension, diabetes, and hypercholesterolemia.  EXAM: MRI HEAD WITHOUT CONTRAST  MRA HEAD WITHOUT CONTRAST  TECHNIQUE: Multiplanar, multiecho pulse sequences of the brain and surrounding structures were obtained without intravenous contrast. Angiographic images of the head were obtained using MRA technique without contrast.  COMPARISON:  Head CT 12/08/2014, MRI 11/08/2014, and MRA 11/03/2014  FINDINGS: MRI HEAD FINDINGS  Some sequences are mildly to moderately degraded by motion artifact.  A partially empty sella is again noted. There is no evidence of acute infarct, intracranial hemorrhage, mass, midline shift, or extra-axial fluid collection. Small foci of T2 hyperintensity in the white matter of the parietal lobes are unchanged from the prior MRI  and nonspecific but compatible with very mild chronic small vessel ischemic disease. Ventricles and sulci are normal.  Orbits are unremarkable. Paranasal sinuses and mastoid air cells are  clear. Major intracranial vascular flow voids are preserved.  MRA HEAD FINDINGS  Images are mildly to moderately degraded by motion artifact.  Visualized distal vertebral arteries are patent and tortuous without evidence of significant stenosis. PICA and SCA origins are patent. Basilar artery is patent with motion artifact proximally but no evidence of significant stenosis. Posterior communicating arteries are not clearly identified. PCAs are patent without evidence of significant stenosis.  Visualized distal cervical ICA on the left is tortuous. Intracranial ICAs are patent without evidence of significant stenosis. ACAs and MCAs are patent without evidence of stenosis. No intracranial aneurysm is identified.  IMPRESSION: 1. No acute intracranial abnormality. 2. Very mild chronic small vessel ischemic disease in the cerebral white matter. 3. Negative head MRA allowing for motion artifact.   Electronically Signed   By: Logan Bores   On: 12/08/2014 19:46   Mr Cervical Spine Wo Contrast  12/10/2014   CLINICAL DATA:  ACUTE ONSET OF LEFT LEG WEAKNESS TODAY. DIZZINESS. SLURRED SPEECH AND BLURRED VISION.  EXAM: MRI CERVICAL SPINE WITHOUT CONTRAST  TECHNIQUE: Multiplanar, multisequence MR imaging of the cervical spine was performed. No intravenous contrast was administered.  COMPARISON:  None.  FINDINGS: The visualized intracranial contents, cervical spinal cord, and paraspinal soft tissues are normal. No foraminal or spinal stenosis. No facet arthritis.  C1-2 through C3-4:  Normal.  C4-5: Minimal broad-based disc bulge with no neural impingement. Otherwise normal.  C5-6: Minimal broad-based disc bulge with no neural impingement. Otherwise normal.  C6-7 through T2-3: Normal.  IMPRESSION: Normal MRI of the cervical spine except for  minimal degenerative disc disease at C4-5 and C5-6.   Electronically Signed   By: Lorriane Shire M.D.   On: 12/10/2014 18:35   Mr Thoracic Spine Wo Contrast  12/10/2014   CLINICAL DATA:  Acute onset of left leg weakness today.  EXAM: MRI THORACIC SPINE WITHOUT CONTRAST  TECHNIQUE: Multiplanar, multisequence MR imaging of the thoracic spine was performed. No intravenous contrast was administered.  COMPARISON:  None.  FINDINGS: Normal conus tip at L1-2. Thoracic spinal cord is normal. Discs throughout the thoracic spine are normal. There is no spinal or foraminal stenosis. Paraspinal soft tissues are normal. No fracture or bone destruction.  IMPRESSION: Normal MRI of the thoracic spine.   Electronically Signed   By: Lorriane Shire M.D.   On: 12/10/2014 18:46   Mr Lumbar Spine Wo Contrast  12/10/2014   CLINICAL DATA:  Acute onset of left leg weakness.  EXAM: MRI LUMBAR SPINE WITHOUT CONTRAST  TECHNIQUE: Multiplanar, multisequence MR imaging of the lumbar spine was performed. No intravenous contrast was administered.  COMPARISON:  CT scan of the abdomen and pelvis dated 11/23/2014  FINDINGS: Normal conus tip at L1-2.  Normal paraspinal soft tissues.  T11-12 through L2-3:  Normal.  L3-4: Small disc bulge into the right neural foramen. Small protrusion into the left neural foramen. No neural impingement.  L4-5: Small protrusions into both neural foramina without neural impingement. Minimal degenerative changes of the facet joints. Slight compression of the thecal sac.  L5-S1: Small broad-based soft disc protrusion which symmetrically compresses the thecal sac and slightly deviates both S1 nerve root sleeves posteriorly.  IMPRESSION: 1. Disc protrusion at L5-S1 with a mass effect on both S1 nerve root sleeves. 2. Foraminal disc protrusions bilaterally at L4-5 without neural impingement. Slight narrowing of the spinal canal. 3. Small disc protrusion at L3-4 into the left neural foramen without neural impingement.    Electronically Signed   By: Jeneen Rinks  Maxwell M.D.   On: 12/10/2014 18:40   Ct Abdomen Pelvis W Contrast  11/23/2014   CLINICAL DATA:  Diarrhea and abdominal pain for 1 week  EXAM: CT ABDOMEN AND PELVIS WITH CONTRAST  TECHNIQUE: Multidetector CT imaging of the abdomen and pelvis was performed using the standard protocol following bolus administration of intravenous contrast.  CONTRAST:  140mL OMNIPAQUE IOHEXOL 300 MG/ML  SOLN  COMPARISON:  None.  FINDINGS: Lung bases are free of acute infiltrate or sizable effusion.  The liver, gallbladder, spleen, adrenal glands and pancreas are within normal limits. The kidneys are well visualized bilaterally with a normal enhancement pattern. Delayed images demonstrate normal excretion of contrast.  The appendix is within normal limits. The bladder is partially distended. No pelvic mass lesion is seen. Bony structures are within normal limits. The uterus demonstrates calcifications consistent with calcified uterine fibroids.  IMPRESSION: No acute abnormality noted.   Electronically Signed   By: Inez Catalina M.D.   On: 11/23/2014 14:06    Admission HPI: Pt is a 49 y/o F w/ PMHx depression, bipolar d/o, GERD, HLD, polysubstance abuse, and asthma who presents w/ left sided weakness, blurry vision, and dizziness x 1 week that has progressively gotten worse. She was previously admitted to [facility] on 5/20 w/ blurred vision, diplopia, nausea, and left sided weakness. She received TPA in the ED, MRI was negative and she was dx w/ probable conversion disorder. Since discharge she has had left sided weakness and has had to rely on her cane and walker more. Howerver, over the past week her left side weakness has worsened along w/ dizziness and blurry vision. Dizziness is constant and worsens with movement, currently she feels like the room is spinning. She has had decreased appetite x 3 days. Denies diarrhea, nausea or vomiting. Denies illicit drug use ( last used cocaine  07/2014), EtOH use, and cigarette smoking. CT head and MRI of head were negative this admission. She follows w/ Granville Lewis at [facility] for her psychiatric problems, she has an appt w/ her on July 22nd. She states she is depressed at home and has frequent crying spells. Currently she is sad that she b/c she is at the hospital. Denies SI, last time was 2 months ago when she was admitted to [facility]. Denies HI. She was admitted for stroke workup.   Hospital Course by problem list:  Psychogenic Left-sided Weakness: Patient presented with a 1 week hx of left sided weakness, blurry vision, and dizziness. CT and MRI/MRA head were negative for acute stroke. Patient noted to have an admission at [facility] from 11/03/14-11/08/14 for blurred vision, diplopia, and left sided weakness. She received tPA during this admission as stroke was suspected. However, imaging at that time was negative for stroke as well. Imaging only showed chronic small vessel disease which is consistent with her imaging this hospitalization. The etiology of her symptoms was not determined at that time. She was then admitted to physical medicine and rehab at [facility] from 11/08/14-11/13/14 and noted to have conversion disorder listed on her discharge summary from there. However, conversion disorder was not listed on the hospital d/c summary. Other etiologies for her left-sided weakness were ruled out, including MS with a negative MRI brain and MRI spine. She did have a disk protrusion at L5-S1 with mass effect on S1 nerve root, but this would not account for her symptoms. A neuromuscular etiology was determined to be less likely given her normal CK 155, aldolase 3.5, and LDH 146.  Psychiatry and Neurology were both consulted and agreed that the patient's symptoms were likely secondary to a psychogenic cause, especially given her extensive psychiatric history. The patient was able to ambulate the hallways safely by time of discharge. She was discharged with a cane  to use at home and recommended to follow up with her PCP and psychiatrist in the next 1 week. Additionally, her medication list was reviewed and any unnecessary medications were discontinued to help prevent polypharmacy that could be contributing to her symptoms.   HTN: BP remained stable in the 914N-829F systolic while not on any medications. Her home Atenolol, Lasix, HCTZ, and Hydralazine were all discontinued at discharge as she remained normotensive and has no history of heart failure. She will need a BP recheck at her PCP's office in the next week. She would benefit from an ACE inhibitor with her hx of diabetes. I would recommend starting low dose Lisinopril 2.5 mg daily as an outpatient.   Type 2 DM: Last HbA1c 6.8 on 11/04/14. CBGs remained well controlled in the hospital with a moderate insulin sliding scale. She was discharged on her home Glipizide 10 mg daily and Metformin 1000 mg BID.     Hyperlipidemia: Last lipid panel on 11/07/14: Chol 213, Trigly 137, HDL 61, and LDL 125. She was continued on her home Atorvastatin 40 mg daily.  Depression, Hx Suicide Attempt, Schizoaffective Disorder, Anxiety: Patient with an extensive psychiatric history. She was last hospitalized at [facility] from 07/21/14-08/02/14 for suicidal/homicidal ideation after reporting she overdosed on her Seroquel and Trileptal. She has also cut her wrists in the past. Psychiatry was consulted as her chief complaint of left-sided weakness was felt to be psychogenic. Per psychiatry's recommendations, she was continued on her home Trileptal 600 mg BID, but her Seroquel was decreased from 600 mg daily to 400 mg daily due to restlessness and akathisia. Her Celexa 20 mg daily was discontinued as she had more side effects than benefit from this medication. Her Hydroxyzine 25 mg Q6H PRN was also discontinued. She denied any SI/HI.She was recommended to follow up with her psychiatrist's office with Debbora Dus, NP within the next 1 week.    Asthma: She was continued on her home albuterol inhaler.   GERD: She was continued on Protonix 40 mg daily. Upon discharge, her home Dexilant and Protonix were discontinued due to polypharmacy. She was discharged on her home Prilosec 20 mg daily.   Hx Alcohol Abuse: Last reported drink was in Feb 2016. Patient was placed on CIWA protocol and given multivitamin, thiamine, and folic acid supplementation during her hospitalization. She did not have any evidence of alcohol withdrawal.   Discharge Vitals:   BP 124/80 mmHg  Pulse 70  Temp(Src) 98.6 F (37 C) (Oral)  Resp 18  Ht 5\' 4"  (1.626 m)  Wt 231 lb 0.7 oz (104.8 kg)  BMI 39.64 kg/m2  SpO2 97%  LMP  (Approximate) Physical Exam General: sitting up in chair, NAD HEENT: /AT, EOMI, sclera anicteric, mucus membranes moist CV: RRR, no m/g/r Pulm: CTA bilaterally, breaths non-labored Abd: BS+, soft, non-tender Ext: warm, no edema Neuro: alert and oriented x 3. Strength 4/5 in LUE and LLE, 5/5 in other extremities.   Discharge Labs:  Results for orders placed or performed during the hospital encounter of 12/08/14 (from the past 24 hour(s))  Glucose, capillary     Status: Abnormal   Collection Time: 12/10/14 11:38 AM  Result Value Ref Range   Glucose-Capillary 136 (H) 65 - 99 mg/dL  Comment 1 Notify RN    Comment 2 Document in Chart   Glucose, capillary     Status: Abnormal   Collection Time: 12/10/14  6:52 PM  Result Value Ref Range   Glucose-Capillary 151 (H) 65 - 99 mg/dL  Glucose, capillary     Status: Abnormal   Collection Time: 12/10/14  9:07 PM  Result Value Ref Range   Glucose-Capillary 194 (H) 65 - 99 mg/dL   Comment 1 Notify RN    Comment 2 Document in Chart   Glucose, capillary     Status: Abnormal   Collection Time: 12/11/14  6:44 AM  Result Value Ref Range   Glucose-Capillary 148 (H) 65 - 99 mg/dL   Comment 1 Notify RN    Comment 2 Document in Chart     Signed: Juliet Rude, MD 12/11/2014, 11:24 AM     Services Ordered on Discharge: None Equipment Ordered on Discharge: [employer]

## 2014-12-11 NOTE — Care Management Note (Signed)
Case Management Note  Patient Details  Name: Maureen Swanson MRN: [id number] Date of Birth: 10/29/1965  Subjective/Objective:                    Action/Plan: Met with patient to discuss discharge planning. Patient states that she DOES have a PCP, Dr Elbert Ewings.  She denies any issues obtaining her medications.  Elizabeth with [facility] is aware of patient and will be seeing her today prior to discharge.  [facility] was notified of need for a cane prior to discharge.  Expected Discharge Date:                  Expected Discharge Plan:  Home/Self Care  In-House Referral:  Financial Counselor  Discharge planning Services  CM Consult  Post Acute Care Choice:  Durable Medical Equipment Choice offered to:  Patient  DME Arranged:  Kasandra Knudsen DME Agency:  [place]:    [facility] Agency:     Status of Service:  Completed, signed off  Medicare Important Message Given:    Date Medicare IM Given:    Medicare IM give by:    Date Additional Medicare IM Given:    Additional Medicare Important Message give by:     If discussed at [place] of Stay Meetings, dates discussed:    Additional Comments:  Rolm Baptise, RN 12/11/2014, 11:19 AM

## 2014-12-14 ENCOUNTER — Ambulatory Visit (HOSPITAL_COMMUNITY)
Admission: RE | Admit: 2014-12-14 | Discharge: 2014-12-14 | Disposition: A | Discharge status: Home / Self Care | Payer: No Typology Code available for payment source | Source: Ambulatory Visit | Attending: Obstetrics and Gynecology | Admitting: Obstetrics and Gynecology

## 2014-12-14 ENCOUNTER — Encounter (HOSPITAL_COMMUNITY): Payer: Self-pay

## 2014-12-14 VITALS — BP 118/82 | Temp 98.7°F | Ht 64.0 in | Wt 237.0 lb

## 2014-12-14 DIAGNOSIS — Z01419 Encounter for gynecological examination (general) (routine) without abnormal findings: Secondary | ICD-10-CM

## 2014-12-14 NOTE — Progress Notes (Signed)
CLINIC:  [facility] ([facility]) [facility]  REASON FOR VISIT: Well-woman exam with routine gynecological exam  HISTORY OF PRESENT ILLNESS:  Ms. Wojdyla is a 49 y.o. female who presents to the [facility] today for clinical breast exam and routine gynecological exam.  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  Today, she is concerned about a potential lump and tenderness she noticed in her left breast about 2-3 months ago.  She has family history of breast cancer in her mother.  Her last pap smear was 03/2010 and was negative.  She had an abnormal pap in 2009 or 2010 requiring colposcopy.  She has no pelvic complaints today.   REVIEW OF SYSTEMS:  Bilateral breast pain/tenderness per HPI.  Denies pelvic pain, pressure, or abnormal vaginal bleeding.    ALLERGIES: Allergies  Allergen Reactions  . Chocolate Hives  . Orange Hives    "Acid foods"  . Penicillins Hives  . Tomato Hives    "acid foods"    CURRENT MEDICATIONS:  Current Outpatient Prescriptions on File Prior to Encounter  Medication Sig Dispense Refill  . albuterol (PROVENTIL HFA;VENTOLIN HFA) 108 (90 BASE) MCG/ACT inhaler Inhale 2 puffs into the lungs every 6 (six) hours as needed for wheezing or shortness of breath.    Marland Kitchen aspirin 81 MG tablet Take 81 mg by mouth daily.    Marland Kitchen atorvastatin (LIPITOR) 40 MG tablet Take 40 mg by mouth daily.    . cetirizine (ZYRTEC) 10 MG tablet Take 10 mg by mouth daily.    . folic acid (FOLVITE) 1 MG tablet Take 1 tablet (1 mg total) by mouth daily. 30 tablet 3  . glipiZIDE (GLUCOTROL) 10 MG tablet Take 10 mg by mouth daily before breakfast.    . indomethacin (INDOCIN) 25 MG capsule Take 25 mg by mouth 3 (three) times daily as needed for mild pain.    . metFORMIN (GLUCOPHAGE) 1000 MG tablet Take 1,000 mg by mouth 2 (two) times daily with a meal.    . Multiple Vitamin (MULTIVITAMIN WITH MINERALS) TABS tablet Take 1 tablet by mouth daily. 30 tablet 3  .  naltrexone (DEPADE) 50 MG tablet Take 50 mg by mouth daily.    Marland Kitchen omeprazole (PRILOSEC) 20 MG capsule Take 1 capsule (20 mg total) by mouth daily. For GERD    . oxcarbazepine (TRILEPTAL) 600 MG tablet Take 1 tablet (600 mg total) by mouth 2 (two) times daily. For mood stabilization 60 tablet 0  . Oyster Shell Calcium (OYSCO 500) 500 MG TABS Take 1 tablet by mouth daily.    . potassium chloride SA (K-DUR,KLOR-CON) 20 MEQ tablet Take 1 tablet (20 mEq total) by mouth daily. 3 tablet 0  . QUEtiapine (SEROQUEL XR) 400 MG 24 hr tablet Take 1 tablet (400 mg total) by mouth daily at 8 pm. 30 tablet 1  . thiamine 100 MG tablet Take 1 tablet (100 mg total) by mouth daily. 30 tablet 3   No current facility-administered medications on file prior to encounter.     PHYSICAL EXAM:  Vitals:  Filed Vitals:   12/14/14 1458  BP: 118/82  Temp: 98.7 F (37.1 C)    General: Well-nourished, well-appearing female in no acute distress.  She is unaccompanied in clinic today.  Rolena Infante, LPN was present during physical exam for this patient.  Breasts: Bilateral breasts exposed and observed with patient standing (arms at side, arms on hips, arms on hips flexed forward, and arms  over head).  Breasts are large and pendulous. No gross abnormalities including breast skin puckering or dimpling noted on observation.  Breasts symmetrical without evidence of skin redness, thickening, or peau d'orange appearance. No nipple retraction or nipple discharge noted bilaterally.  No breast nodularity palpated in bilateral breasts.  No appreciable mass in the area of patient concern at about 8 o'clock on the medial side of the breast near the breast ridge.  There is normal fibrocystic breast tissue noted in bilateral breasts.  Axillary lymph nodes: No axillary lymphadenopathy bilaterally.   GU:  -External genitalia: No lesions, swelling, or discharge. Even hair distribution as expected.  -Vagina: Pink, moist. No lesions or  discharge noted in vaginal canal.  -Cervix: Cervix pink without lesions. Mildly friable. Cervical os patent. Scant yellow cervical discharge noted.  -Uterus: Bimanual exam demonstrates no uterine mass or tenderness on palpation. Uterus in normal position and normal size.  -Adnexae: Bimanual exam demonstrates no ovarian masses or tenderness on palpation.  -Rectovaginal: No lesions noted to rectum. Rectal tone intact.  No masses or nodularity palpated by bimanual rectovaginal exam.     ASSESSMENT & PLAN:   1. Breast cancer screening: Ms. Higley has no palpable breast abnormalities on her clinical breast exam today.  She will receive her diagnostic mammogram as scheduled.  She will be contacted by the imaging center for results of the mammogram. She was given instructions and educational materials regarding breast self-awareness. Ms. Frankland is aware of this plan and agrees with it.   2. Cervical cancer screening: Ms. Hallisey has a normal pelvic exam today.  A pap smear was completed today per protocol.  She tolerated the procedure without complaints.  She will be contacted by one of our [place] in the next few weeks to review the results of the pap smear with the patient.    Ms. Feely was encouraged to ask questions and all questions were answered to her satisfaction.    Mike Craze, NP [place]  [phone]

## 2014-12-15 ENCOUNTER — Other Ambulatory Visit: Payer: Self-pay

## 2014-12-19 LAB — CYTOLOGY - PAP

## 2014-12-27 ENCOUNTER — Ambulatory Visit
Admission: RE | Admit: 2014-12-27 | Discharge: 2014-12-27 | Disposition: A | Discharge status: Home / Self Care | Payer: No Typology Code available for payment source | Source: Ambulatory Visit | Attending: Obstetrics and Gynecology | Admitting: Obstetrics and Gynecology

## 2014-12-27 DIAGNOSIS — N644 Mastodynia: Secondary | ICD-10-CM

## 2015-01-25 ENCOUNTER — Emergency Department (HOSPITAL_COMMUNITY)
Admission: EM | Admit: 2015-01-25 | Discharge: 2015-01-25 | Disposition: A | Discharge status: Home / Self Care | Payer: No Typology Code available for payment source | Attending: Emergency Medicine | Admitting: Emergency Medicine

## 2015-01-25 ENCOUNTER — Encounter (HOSPITAL_COMMUNITY): Payer: Self-pay | Admitting: Emergency Medicine

## 2015-01-25 DIAGNOSIS — M1990 Unspecified osteoarthritis, unspecified site: Secondary | ICD-10-CM | POA: Insufficient documentation

## 2015-01-25 DIAGNOSIS — Z8742 Personal history of other diseases of the female genital tract: Secondary | ICD-10-CM | POA: Insufficient documentation

## 2015-01-25 DIAGNOSIS — G43909 Migraine, unspecified, not intractable, without status migrainosus: Secondary | ICD-10-CM | POA: Insufficient documentation

## 2015-01-25 DIAGNOSIS — Z880 Allergy status to penicillin: Secondary | ICD-10-CM | POA: Insufficient documentation

## 2015-01-25 DIAGNOSIS — E119 Type 2 diabetes mellitus without complications: Secondary | ICD-10-CM

## 2015-01-25 DIAGNOSIS — M109 Gout, unspecified: Secondary | ICD-10-CM | POA: Insufficient documentation

## 2015-01-25 DIAGNOSIS — J45909 Unspecified asthma, uncomplicated: Secondary | ICD-10-CM | POA: Insufficient documentation

## 2015-01-25 DIAGNOSIS — K219 Gastro-esophageal reflux disease without esophagitis: Secondary | ICD-10-CM | POA: Insufficient documentation

## 2015-01-25 DIAGNOSIS — R739 Hyperglycemia, unspecified: Secondary | ICD-10-CM

## 2015-01-25 DIAGNOSIS — F319 Bipolar disorder, unspecified: Secondary | ICD-10-CM | POA: Insufficient documentation

## 2015-01-25 DIAGNOSIS — Z87891 Personal history of nicotine dependence: Secondary | ICD-10-CM | POA: Insufficient documentation

## 2015-01-25 DIAGNOSIS — Z79899 Other long term (current) drug therapy: Secondary | ICD-10-CM | POA: Insufficient documentation

## 2015-01-25 DIAGNOSIS — Z7982 Long term (current) use of aspirin: Secondary | ICD-10-CM | POA: Insufficient documentation

## 2015-01-25 DIAGNOSIS — E780 Pure hypercholesterolemia: Secondary | ICD-10-CM | POA: Insufficient documentation

## 2015-01-25 DIAGNOSIS — E1165 Type 2 diabetes mellitus with hyperglycemia: Secondary | ICD-10-CM | POA: Insufficient documentation

## 2015-01-25 DIAGNOSIS — F419 Anxiety disorder, unspecified: Secondary | ICD-10-CM | POA: Insufficient documentation

## 2015-01-25 DIAGNOSIS — I10 Essential (primary) hypertension: Secondary | ICD-10-CM | POA: Insufficient documentation

## 2015-01-25 LAB — BLOOD GAS, VENOUS
ACID-BASE EXCESS: 3 mmol/L — AB (ref 0.0–2.0)
BICARBONATE: 27.8 meq/L — AB (ref 20.0–24.0)
Drawn by: 103701
O2 Saturation: 84.6 %
PATIENT TEMPERATURE: 98.6
TCO2: 25.1 mmol/L (ref 0–100)
pCO2, Ven: 45.6 mmHg (ref 45.0–50.0)
pH, Ven: 7.402 — ABNORMAL HIGH (ref 7.250–7.300)
pO2, Ven: 54.9 mmHg — ABNORMAL HIGH (ref 30.0–45.0)

## 2015-01-25 LAB — COMPREHENSIVE METABOLIC PANEL
ALK PHOS: 78 U/L (ref 38–126)
ALT: 13 U/L — AB (ref 14–54)
ANION GAP: 10 (ref 5–15)
AST: 23 U/L (ref 15–41)
Albumin: 3.9 g/dL (ref 3.5–5.0)
BUN: 18 mg/dL (ref 6–20)
CALCIUM: 9.1 mg/dL (ref 8.9–10.3)
CO2: 28 mmol/L (ref 22–32)
Chloride: 98 mmol/L — ABNORMAL LOW (ref 101–111)
Creatinine, Ser: 1.03 mg/dL — ABNORMAL HIGH (ref 0.44–1.00)
GFR calc Af Amer: >60 mL/min (ref >60)
GFR calc non Af Amer: >60 mL/min (ref >60)
GLUCOSE: 476 mg/dL — AB (ref 65–99)
POTASSIUM: 3.8 mmol/L (ref 3.5–5.1)
SODIUM: 136 mmol/L (ref 135–145)
TOTAL PROTEIN: 7.5 g/dL (ref 6.5–8.1)
Total Bilirubin: 0.4 mg/dL (ref 0.3–1.2)

## 2015-01-25 LAB — CBC WITH DIFFERENTIAL/PLATELET
BASOS ABS: 0 10*3/uL (ref 0.0–0.1)
Basophils Relative: 0 % (ref 0–1)
Eosinophils Absolute: 0.1 10*3/uL (ref 0.0–0.7)
Eosinophils Relative: 1 % (ref 0–5)
HCT: 34.5 % — ABNORMAL LOW (ref 36.0–46.0)
HEMOGLOBIN: 11.2 g/dL — AB (ref 12.0–15.0)
Lymphocytes Relative: 21 % (ref 12–46)
Lymphs Abs: 1.2 10*3/uL (ref 0.7–4.0)
MCH: 28.2 pg (ref 26.0–34.0)
MCHC: 32.5 g/dL (ref 30.0–36.0)
MCV: 86.9 fL (ref 78.0–100.0)
MONO ABS: 0.5 10*3/uL (ref 0.1–1.0)
Monocytes Relative: 8 % (ref 3–12)
NEUTROS PCT: 70 % (ref 43–77)
Neutro Abs: 3.8 10*3/uL (ref 1.7–7.7)
PLATELETS: 215 10*3/uL (ref 150–400)
RBC: 3.97 MIL/uL (ref 3.87–5.11)
RDW: 12.9 % (ref 11.5–15.5)
WBC: 5.5 10*3/uL (ref 4.0–10.5)

## 2015-01-25 LAB — URINE MICROSCOPIC-ADD ON

## 2015-01-25 LAB — URINALYSIS, ROUTINE W REFLEX MICROSCOPIC
Bilirubin Urine: NEGATIVE
Hgb urine dipstick: NEGATIVE
Ketones, ur: NEGATIVE mg/dL
Nitrite: NEGATIVE
PH: 6.5 (ref 5.0–8.0)
Protein, ur: NEGATIVE mg/dL
SPECIFIC GRAVITY, URINE: 1.041 — AB (ref 1.005–1.030)
Urobilinogen, UA: 0.2 mg/dL (ref 0.0–1.0)

## 2015-01-25 LAB — MAGNESIUM: Magnesium: 1.4 mg/dL — ABNORMAL LOW (ref 1.7–2.4)

## 2015-01-25 LAB — CBG MONITORING, ED
GLUCOSE-CAPILLARY: 389 mg/dL — AB (ref 65–99)
Glucose-Capillary: 462 mg/dL — ABNORMAL HIGH (ref 65–99)
Glucose-Capillary: 431 mg/dL — ABNORMAL HIGH (ref 65–99)

## 2015-01-25 LAB — PHOSPHORUS: Phosphorus: 2.8 mg/dL (ref 2.5–4.6)

## 2015-01-25 MED ORDER — SODIUM CHLORIDE 0.9 % IV BOLUS (SEPSIS)
1000.0000 mL | Freq: Once | INTRAVENOUS | Status: AC
  Administered 2015-01-25: 1000 mL via INTRAVENOUS

## 2015-01-25 NOTE — ED Notes (Signed)
Bed: WA07 Expected date:  Expected time:  Means of arrival:  Comments: hyperglycemia

## 2015-01-25 NOTE — ED Provider Notes (Signed)
CSN: [id number]     Arrival date & time 01/25/15  1036 History   First MD Initiated Contact with Patient 01/25/15 1047     Chief Complaint  Patient presents with  . Hyperglycemia     (Consider location/radiation/quality/duration/timing/severity/associated sxs/prior Treatment) HPI   49 year old female with history of type 2 diabetes and schizoaffective disorder who presents with hyperglycemia for one week. Saw PCP Monday and told her that she will need to be put on insulin. Started on Januvia, but because she still had elevated glucose the nurse for her PCP said that they would need to put her on insulin. Reports polyuria, polydipsia, and generalized weakness. She has felt nauseous today but denies vomiting, abdominal pain, or diarrhea. She has not had any chest pain or shortness of breath. Denies any recent fevers, chills, cough, or any sick contacts.    Past Medical History  Diagnosis Date  . High cholesterol   . Depression   . Gout   . Anxiety   . Bipolar 1 disorder   . GERD (gastroesophageal reflux disease)   . Substance abuse     crack cocaine  . Headache(784.0)     migraines  . TMJ (temporomandibular joint disorder)   . Asthma     daily and prn inhalers  . Arthritis     back and knees  . Gastric ulcer   . 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 Hypertension     under control, has been on med. x 12 yrs.  . Diabetes mellitus     diet-controlled   Past Surgical History  Procedure Laterality Date  . Knee arthroscopy w/ partial medial meniscectomy  05/01/2010    right  . Breast lumpectomy with needle localization  04/19/2012    Procedure: BREAST LUMPECTOMY WITH NEEDLE LOCALIZATION;  Surgeon: [name], MD;  Location: [facility];  Service: General;  Laterality: Right;   Family History  Problem Relation Age of Onset  . Diabetes Mother   . Breast cancer Mother   . Cancer Father    Social History  Substance Use Topics  . Smoking  status: Former Smoker -- 15 years    Quit date: 08/02/2012  . Smokeless tobacco: Never Used     Comment: quit smoking 02/2012  . Alcohol Use: No   OB History    Gravida Para Term Preterm AB TAB SAB Ectopic Multiple Living   3 2 2  1  1   2      Review of Systems 10/14 systems reviewed and are negative other than those stated in the HPI    Allergies  Chocolate; Orange; Penicillins; and Tomato  Home Medications   Prior to Admission medications   Medication Sig Start Date End Date Taking? Authorizing Provider  albuterol (PROVENTIL HFA;VENTOLIN HFA) 108 (90 BASE) MCG/ACT inhaler Inhale 2 puffs into the lungs every 6 (six) hours as needed for wheezing or shortness of breath. 03/02/14  Yes Encarnacion Slates, NP  amLODipine (NORVASC) 5 MG tablet Take 5 mg by mouth daily.   Yes Historical Provider, MD  aspirin 81 MG tablet Take 81 mg by mouth daily.   Yes Historical Provider, MD  atenolol (TENORMIN) 100 MG tablet Take 100 mg by mouth daily.   Yes Historical Provider, MD  atorvastatin (LIPITOR) 40 MG tablet Take 40 mg by mouth every evening.    Yes Historical Provider, MD  celecoxib (CELEBREX) 100 MG capsule Take 100 mg by mouth 2 (  two) times daily.   Yes Historical Provider, MD  cetirizine (ZYRTEC) 10 MG tablet Take 10 mg by mouth daily.   Yes Historical Provider, MD  Dexlansoprazole 30 MG capsule Take 30 mg by mouth daily.   Yes Historical Provider, MD  furosemide (LASIX) 40 MG tablet Take 40 mg by mouth daily.   Yes Historical Provider, MD  gabapentin (NEURONTIN) 400 MG capsule Take 400 mg by mouth 2 (two) times daily.   Yes Historical Provider, MD  hydrochlorothiazide (HYDRODIURIL) 25 MG tablet Take 25 mg by mouth daily.   Yes Historical Provider, MD  hydrOXYzine (VISTARIL) 25 MG capsule Take 25 mg by mouth every 6 (six) hours as needed for anxiety.   Yes Historical Provider, MD  indomethacin (INDOCIN) 25 MG capsule Take 25 mg by mouth 3 (three) times daily as needed for mild pain.   Yes  Historical Provider, MD  metFORMIN (GLUCOPHAGE) 1000 MG tablet Take 1,000 mg by mouth 2 (two) times daily with a meal.   Yes Historical Provider, MD  metoCLOPramide (REGLAN) 10 MG tablet Take 10 mg by mouth 4 (four) times daily -  before meals and at bedtime.   Yes Historical Provider, MD  naltrexone (DEPADE) 50 MG tablet Take 50 mg by mouth daily.   Yes Historical Provider, MD  omeprazole (PRILOSEC) 20 MG capsule Take 1 capsule (20 mg total) by mouth daily. For GERD 08/02/14  Yes Shuvon B Rankin, NP  Oxcarbazepine (TRILEPTAL) 300 MG tablet Take 300 mg by mouth 2 (two) times daily.   Yes Historical Provider, MD  [place] Calcium (OYSCO 500) 500 MG TABS Take 1 tablet by mouth daily.   Yes Historical Provider, MD  potassium chloride SA (K-DUR,KLOR-CON) 20 MEQ tablet Take 1 tablet (20 mEq total) by mouth daily. 11/23/14  Yes Wandra Arthurs, MD  QUEtiapine (SEROQUEL XR) 400 MG 24 hr tablet Take 1 tablet (400 mg total) by mouth daily at 8 pm. 12/11/14  Yes Carly J Rivet, MD  sitaGLIPtin (JANUVIA) 100 MG tablet Take 100 mg by mouth daily.   Yes Historical Provider, MD  folic acid (FOLVITE) 1 MG tablet Take 1 tablet (1 mg total) by mouth daily. Patient not taking: Reported on 01/25/2015 12/11/14   Juliet Rude, MD  Multiple Vitamin (MULTIVITAMIN WITH MINERALS) TABS tablet Take 1 tablet by mouth daily. Patient not taking: Reported on 01/25/2015 12/11/14   Juliet Rude, MD  oxcarbazepine (TRILEPTAL) 600 MG tablet Take 1 tablet (600 mg total) by mouth 2 (two) times daily. For mood stabilization Patient not taking: Reported on 01/25/2015 12/11/14   Juliet Rude, MD  thiamine 100 MG tablet Take 1 tablet (100 mg total) by mouth daily. Patient not taking: Reported on 01/25/2015 12/11/14   Sindy Guadeloupe Rivet, MD   BP 109/70 mmHg  Pulse 88  Temp(Src) 98.7 F (37.1 C) (Oral)  Resp 24  SpO2 99%  LMP  (Approximate) Physical Exam Physical Exam  Nursing note and vitals reviewed. Constitutional: Well developed, well  nourished, non-toxic, and in no acute distress Head: Normocephalic and atraumatic.  Mouth/Throat: Oropharynx is clear, but dry.  Neck: Normal range of motion. Neck supple.  Cardiovascular: Tachycardic rate and regular rhythm.  no edema.  Pulmonary/Chest: Effort normal and breath sounds normal.  Abdominal: Soft. There is no tenderness. There is no rebound and no guarding.  Musculoskeletal: Normal range of motion.  Neurological: Alert, no facial droop, fluent speech, moves all extremities symmetrically.  Skin: Skin is warm and dry.  Psychiatric: Cooperative  ED Course  Procedures (including critical care time) Labs Review Labs Reviewed  CBC WITH DIFFERENTIAL/PLATELET - Abnormal; Notable for the following:    Hemoglobin 11.2 (*)    HCT 34.5 (*)    All other components within normal limits  COMPREHENSIVE METABOLIC PANEL - Abnormal; Notable for the following:    Chloride 98 (*)    Glucose, Bld 476 (*)    Creatinine, Ser 1.03 (*)    ALT 13 (*)    All other components within normal limits  URINALYSIS, ROUTINE W REFLEX MICROSCOPIC (NOT AT [facility]) - Abnormal; Notable for the following:    APPearance CLOUDY (*)    Specific Gravity, Urine 1.041 (*)    Glucose, UA >1000 (*)    Leukocytes, UA TRACE (*)    All other components within normal limits  MAGNESIUM - Abnormal; Notable for the following:    Magnesium 1.4 (*)    All other components within normal limits  BLOOD GAS, VENOUS - Abnormal; Notable for the following:    pH, Ven 7.402 (*)    pO2, Ven 54.9 (*)    Bicarbonate 27.8 (*)    Acid-Base Excess 3.0 (*)    All other components within normal limits  URINE MICROSCOPIC-ADD ON - Abnormal; Notable for the following:    Squamous Epithelial / LPF FEW (*)    Bacteria, UA FEW (*)    All other components within normal limits  CBG MONITORING, ED - Abnormal; Notable for the following:    Glucose-Capillary 462 (*)    All other components within normal limits  CBG MONITORING, ED - Abnormal;  Notable for the following:    Glucose-Capillary 431 (*)    All other components within normal limits  CBG MONITORING, ED - Abnormal; Notable for the following:    Glucose-Capillary 389 (*)    All other components within normal limits  PHOSPHORUS    Imaging Review No results found.   EKG Interpretation   Date/Time:  Thursday January 25 2015 12:06:36 EDT Ventricular Rate:  87 PR Interval:  175 QRS Duration: 91 QT Interval:  379 QTC Calculation: 456 R Axis:     Text Interpretation:  Normal sinus rhythm Left ventricular hypertrophy No  significant change since last tracing Confirmed by Nathin Saran MD, Hinton Dyer ([id number]) on  01/25/2015 1:20:17 PM      MDM   Final diagnoses:  Hyperglycemia  Type 2 diabetes mellitus without complication    In short, this is a 49 year old female with history of type 2 diabetes and schizoaffective affective disorder who presents with hyperglycemia. She is nontoxic and in no acute distress on presentation. She is mildly tachycardic but normotensive and in no distress. She appears dry on exam, but the remainder of her physical exam is nonfocal and unremarkable. Point-of-care glucose is elevated at 462. No evidence of DKA as normal pH and bicarbonate and without anion gap. She is given 2 L of IV fluids with improvement in her glucose to 389. She feels symptomatically improved and tachycardic improved. I discussed this patient with her PCP, who reports that he will call her a new prescription for insulin. She was felt appropriate for discharge home. Strict return and follow-up instructions were reviewed. She states understanding of all discharge instructions felt comfortable with the plan of care.   Forde Dandy, MD 01/25/15 [phone]

## 2015-01-25 NOTE — ED Notes (Signed)
Awake. Verbally responsive. A/O x4. Resp even and unlabored. No audible adventitious breath sounds noted. ABC's intact. SR on monitor. Pt denies blurred vision, thirsty or polyuria. Pt was given ice chips earlier.

## 2015-01-25 NOTE — ED Notes (Signed)
CBG 462 

## 2015-01-25 NOTE — Discharge Instructions (Signed)
Your primary care physician says that he will call in a insulin prescription for you this afternoon. Please call your PCPs office to discuss any further details of this when you leave. Return without fail for worsening symptoms including vomiting and unable to keep down food or fluids, severe abdominal pain, confusion, or any other symptoms concerning to you.  Hyperglycemia Hyperglycemia occurs when the glucose (sugar) in your blood is too high. Hyperglycemia can happen for many reasons, but it most often happens to people who do not know they have diabetes or are not managing their diabetes properly.  CAUSES  Whether you have diabetes or not, there are other causes of hyperglycemia. Hyperglycemia can occur when you have diabetes, but it can also occur in other situations that you might not be as aware of, such as: Diabetes  If you have diabetes and are having problems controlling your blood glucose, hyperglycemia could occur because of some of the following reasons:  Not following your meal plan.  Not taking your diabetes medications or not taking it properly.  Exercising less or doing less activity than you normally do.  Being sick. Pre-diabetes  This cannot be ignored. Before people develop Type 2 diabetes, they almost always have "pre-diabetes." This is when your blood glucose levels are higher than normal, but not yet high enough to be diagnosed as diabetes. Research has shown that some long-term damage to the body, especially the heart and circulatory system, may already be occurring during pre-diabetes. If you take action to manage your blood glucose when you have pre-diabetes, you may delay or prevent Type 2 diabetes from developing. Stress  If you have diabetes, you may be "diet" controlled or on oral medications or insulin to control your diabetes. However, you may find that your blood glucose is higher than usual in the hospital whether you have diabetes or not. This is often  referred to as "stress hyperglycemia." Stress can elevate your blood glucose. This happens because of hormones put out by the body during times of stress. If stress has been the cause of your high blood glucose, it can be followed regularly by your caregiver. That way he/she can make sure your hyperglycemia does not continue to get worse or progress to diabetes. Steroids  Steroids are medications that act on the infection fighting system (immune system) to block inflammation or infection. One side effect can be a rise in blood glucose. Most people can produce enough extra insulin to allow for this rise, but for those who cannot, steroids make blood glucose levels go even higher. It is not unusual for steroid treatments to "uncover" diabetes that is developing. It is not always possible to determine if the hyperglycemia will go away after the steroids are stopped. A special blood test called an A1c is sometimes done to determine if your blood glucose was elevated before the steroids were started. SYMPTOMS  Thirsty.  Frequent urination.  Dry mouth.  Blurred vision.  Tired or fatigue.  Weakness.  Sleepy.  Tingling in feet or leg. DIAGNOSIS  Diagnosis is made by monitoring blood glucose in one or all of the following ways:  A1c test. This is a chemical found in your blood.  Fingerstick blood glucose monitoring.  Laboratory results. TREATMENT  First, knowing the cause of the hyperglycemia is important before the hyperglycemia can be treated. Treatment may include, but is not be limited to:  Education.  Change or adjustment in medications.  Change or adjustment in meal plan.  Treatment for  an illness, infection, etc.  More frequent blood glucose monitoring.  Change in exercise plan.  Decreasing or stopping steroids.  Lifestyle changes. HOME CARE INSTRUCTIONS   Test your blood glucose as directed.  Exercise regularly. Your caregiver will give you instructions about  exercise. Pre-diabetes or diabetes which comes on with stress is helped by exercising.  Eat wholesome, balanced meals. Eat often and at regular, fixed times. Your caregiver or nutritionist will give you a meal plan to guide your sugar intake.  Being at an ideal weight is important. If needed, losing as little as 10 to 15 pounds may help improve blood glucose levels. SEEK MEDICAL CARE IF:   You have questions about medicine, activity, or diet.  You continue to have symptoms (problems such as increased thirst, urination, or weight gain). SEEK IMMEDIATE MEDICAL CARE IF:   You are vomiting or have diarrhea.  Your breath smells fruity.  You are breathing faster or slower.  You are very sleepy or incoherent.  You have numbness, tingling, or pain in your feet or hands.  You have chest pain.  Your symptoms get worse even though you have been following your caregiver's orders.  If you have any other questions or concerns. Document Released: 11/26/2000 Document Revised: 08/25/2011 Document Reviewed: 09/29/2011 [facility] Patient Information 2015 [place], [place]. This information is not intended to replace advice given to you by your health care provider. Make sure you discuss any questions you have with your health care provider.

## 2015-01-25 NOTE — ED Notes (Addendum)
Pt arrived via [facility] with report of hyperglycemia x4 days. PMD changed medication to Januvia without effectiveness noted. Pt reported CBG at home 598, dizziness, blurred vision, weakness, polyuria, thirstyness, dry mouth and chronic pain in lt arm. Currently CBG 462.

## 2015-01-25 NOTE — ED Notes (Signed)
Awake. Verbally responsive. A/O x4. Resp even and unlabored. No audible adventitious breath sounds noted. ABC's intact. SR on monitor. IV patent and intact infusing NS at 954ml/hr without difficulty.

## 2015-01-25 NOTE — ED Notes (Signed)
Awake. Verbally responsive. A/O x4. Resp even and unlabored. No audible adventitious breath sounds noted. ABC's intact.  

## 2015-02-07 ENCOUNTER — Encounter (HOSPITAL_COMMUNITY): Payer: Self-pay | Admitting: Emergency Medicine

## 2015-02-07 ENCOUNTER — Emergency Department (HOSPITAL_COMMUNITY)
Admission: EM | Admit: 2015-02-07 | Discharge: 2015-02-07 | Disposition: A | Discharge status: Home / Self Care | Payer: No Typology Code available for payment source | Attending: Emergency Medicine | Admitting: Emergency Medicine

## 2015-02-07 DIAGNOSIS — R000 Tachycardia, unspecified: Secondary | ICD-10-CM | POA: Insufficient documentation

## 2015-02-07 DIAGNOSIS — K219 Gastro-esophageal reflux disease without esophagitis: Secondary | ICD-10-CM | POA: Insufficient documentation

## 2015-02-07 DIAGNOSIS — R3589 Other polyuria: Secondary | ICD-10-CM

## 2015-02-07 DIAGNOSIS — E860 Dehydration: Secondary | ICD-10-CM

## 2015-02-07 DIAGNOSIS — D649 Anemia, unspecified: Secondary | ICD-10-CM

## 2015-02-07 DIAGNOSIS — J45909 Unspecified asthma, uncomplicated: Secondary | ICD-10-CM | POA: Insufficient documentation

## 2015-02-07 DIAGNOSIS — Z79899 Other long term (current) drug therapy: Secondary | ICD-10-CM | POA: Insufficient documentation

## 2015-02-07 DIAGNOSIS — Z7982 Long term (current) use of aspirin: Secondary | ICD-10-CM | POA: Insufficient documentation

## 2015-02-07 DIAGNOSIS — R358 Other polyuria: Secondary | ICD-10-CM

## 2015-02-07 DIAGNOSIS — Z8742 Personal history of other diseases of the female genital tract: Secondary | ICD-10-CM | POA: Insufficient documentation

## 2015-02-07 DIAGNOSIS — N390 Urinary tract infection, site not specified: Secondary | ICD-10-CM

## 2015-02-07 DIAGNOSIS — R739 Hyperglycemia, unspecified: Secondary | ICD-10-CM

## 2015-02-07 DIAGNOSIS — E780 Pure hypercholesterolemia: Secondary | ICD-10-CM | POA: Insufficient documentation

## 2015-02-07 DIAGNOSIS — R631 Polydipsia: Secondary | ICD-10-CM

## 2015-02-07 DIAGNOSIS — Z794 Long term (current) use of insulin: Secondary | ICD-10-CM | POA: Insufficient documentation

## 2015-02-07 DIAGNOSIS — I10 Essential (primary) hypertension: Secondary | ICD-10-CM | POA: Insufficient documentation

## 2015-02-07 DIAGNOSIS — E1165 Type 2 diabetes mellitus with hyperglycemia: Secondary | ICD-10-CM | POA: Insufficient documentation

## 2015-02-07 DIAGNOSIS — Z87891 Personal history of nicotine dependence: Secondary | ICD-10-CM | POA: Insufficient documentation

## 2015-02-07 DIAGNOSIS — R112 Nausea with vomiting, unspecified: Secondary | ICD-10-CM

## 2015-02-07 DIAGNOSIS — R1030 Lower abdominal pain, unspecified: Secondary | ICD-10-CM

## 2015-02-07 DIAGNOSIS — M1990 Unspecified osteoarthritis, unspecified site: Secondary | ICD-10-CM | POA: Insufficient documentation

## 2015-02-07 DIAGNOSIS — Z880 Allergy status to penicillin: Secondary | ICD-10-CM | POA: Insufficient documentation

## 2015-02-07 DIAGNOSIS — Z8659 Personal history of other mental and behavioral disorders: Secondary | ICD-10-CM | POA: Insufficient documentation

## 2015-02-07 LAB — BLOOD GAS, VENOUS
ACID-BASE EXCESS: 2.9 mmol/L — AB (ref 0.0–2.0)
Bicarbonate: 27.3 mEq/L — ABNORMAL HIGH (ref 20.0–24.0)
FIO2: 0.21
O2 Saturation: 88.5 %
PCO2 VEN: 42.9 mmHg — AB (ref 45.0–50.0)
PO2 VEN: 58.9 mmHg — AB (ref 30.0–45.0)
Patient temperature: 98.6
TCO2: 24.6 mmol/L (ref 0–100)
pH, Ven: 7.419 — ABNORMAL HIGH (ref 7.250–7.300)

## 2015-02-07 LAB — CBC WITH DIFFERENTIAL/PLATELET
BASOS PCT: 0 % (ref 0–1)
Basophils Absolute: 0 10*3/uL (ref 0.0–0.1)
Eosinophils Absolute: 0 10*3/uL (ref 0.0–0.7)
Eosinophils Relative: 0 % (ref 0–5)
HEMATOCRIT: 35.5 % — AB (ref 36.0–46.0)
HEMOGLOBIN: 11.6 g/dL — AB (ref 12.0–15.0)
LYMPHS ABS: 1.2 10*3/uL (ref 0.7–4.0)
LYMPHS PCT: 15 % (ref 12–46)
MCH: 27.8 pg (ref 26.0–34.0)
MCHC: 32.7 g/dL (ref 30.0–36.0)
MCV: 85.1 fL (ref 78.0–100.0)
MONOS PCT: 5 % (ref 3–12)
Monocytes Absolute: 0.3 10*3/uL (ref 0.1–1.0)
NEUTROS ABS: 6.1 10*3/uL (ref 1.7–7.7)
NEUTROS PCT: 80 % — AB (ref 43–77)
Platelets: 240 10*3/uL (ref 150–400)
RBC: 4.17 MIL/uL (ref 3.87–5.11)
RDW: 12.3 % (ref 11.5–15.5)
WBC: 7.6 10*3/uL (ref 4.0–10.5)

## 2015-02-07 LAB — COMPREHENSIVE METABOLIC PANEL
ALBUMIN: 4.2 g/dL (ref 3.5–5.0)
ALT: 15 U/L (ref 14–54)
ANION GAP: 13 (ref 5–15)
AST: 21 U/L (ref 15–41)
Alkaline Phosphatase: 83 U/L (ref 38–126)
BILIRUBIN TOTAL: 0.3 mg/dL (ref 0.3–1.2)
BUN: 23 mg/dL — AB (ref 6–20)
CO2: 27 mmol/L (ref 22–32)
Calcium: 9.5 mg/dL (ref 8.9–10.3)
Chloride: 93 mmol/L — ABNORMAL LOW (ref 101–111)
Creatinine, Ser: 1.02 mg/dL — ABNORMAL HIGH (ref 0.44–1.00)
GFR calc Af Amer: >60 mL/min (ref >60)
GFR calc non Af Amer: >60 mL/min (ref >60)
GLUCOSE: 444 mg/dL — AB (ref 65–99)
Potassium: 3.7 mmol/L (ref 3.5–5.1)
Sodium: 133 mmol/L — ABNORMAL LOW (ref 135–145)
TOTAL PROTEIN: 8.1 g/dL (ref 6.5–8.1)

## 2015-02-07 LAB — URINALYSIS, ROUTINE W REFLEX MICROSCOPIC
BILIRUBIN URINE: NEGATIVE
Hgb urine dipstick: NEGATIVE
KETONES UR: NEGATIVE mg/dL
Nitrite: NEGATIVE
PH: 5.5 (ref 5.0–8.0)
Protein, ur: NEGATIVE mg/dL
Specific Gravity, Urine: 1.015 (ref 1.005–1.030)
Urobilinogen, UA: 0.2 mg/dL (ref 0.0–1.0)

## 2015-02-07 LAB — LIPASE, BLOOD: Lipase: 28 U/L (ref 22–51)

## 2015-02-07 LAB — URINE MICROSCOPIC-ADD ON

## 2015-02-07 LAB — PHOSPHORUS: Phosphorus: 4.6 mg/dL (ref 2.5–4.6)

## 2015-02-07 LAB — MAGNESIUM: Magnesium: 1.3 mg/dL — ABNORMAL LOW (ref 1.7–2.4)

## 2015-02-07 LAB — CBG MONITORING, ED: Glucose-Capillary: 382 mg/dL — ABNORMAL HIGH (ref 65–99)

## 2015-02-07 MED ORDER — SODIUM CHLORIDE 0.9 % IV BOLUS (SEPSIS)
1000.0000 mL | Freq: Once | INTRAVENOUS | Status: AC
  Administered 2015-02-07: 1000 mL via INTRAVENOUS

## 2015-02-07 MED ORDER — MORPHINE SULFATE (PF) 2 MG/ML IV SOLN
2.0000 mg | Freq: Once | INTRAVENOUS | Status: AC
  Administered 2015-02-07: 2 mg via INTRAVENOUS
  Filled 2015-02-07: qty 1

## 2015-02-07 MED ORDER — CEFTRIAXONE SODIUM 1 G IJ SOLR
1.0000 g | Freq: Once | INTRAMUSCULAR | Status: AC
  Administered 2015-02-07: 1 g via INTRAVENOUS
  Filled 2015-02-07: qty 10

## 2015-02-07 MED ORDER — ONDANSETRON HCL 4 MG/2ML IJ SOLN
4.0000 mg | Freq: Once | INTRAMUSCULAR | Status: AC
  Administered 2015-02-07: 4 mg via INTRAVENOUS
  Filled 2015-02-07: qty 2

## 2015-02-07 NOTE — Discharge Instructions (Signed)
Your sugar is running high and could be from your urine infection. Eat low carb diet, no sugary foods or beverages. Exercise daily and take your medications as directed. Stay very well hydrated with plenty of water throughout the day. Take antibiotic until completed. Use norco as directed as needed for pain, but don't drive while taking this medication. Use zofran as needed for nausea. Follow up with primary care physician in 3-5 days for recheck of ongoing symptoms but return to ER for emergent changing or worsening of symptoms. Please seek immediate care if you develop the following: You develop back pain.  Your symptoms are no better, or worse in 3 days. There is severe back pain or lower abdominal pain.  You develop chills.  You have a fever.  There is nausea or vomiting.  There is continued burning or discomfort with urination.     Abdominal Pain, Women Abdominal (stomach, pelvic, or belly) pain can be caused by many things. It is important to tell your doctor:  The location of the pain.  Does it come and go or is it present all the time?  Are there things that start the pain (eating certain foods, exercise)?  Are there other symptoms associated with the pain (fever, nausea, vomiting, diarrhea)? All of this is helpful to know when trying to find the cause of the pain. CAUSES   Stomach: virus or bacteria infection, or ulcer.  Intestine: appendicitis (inflamed appendix), regional ileitis (Crohn's disease), ulcerative colitis (inflamed colon), irritable bowel syndrome, diverticulitis (inflamed diverticulum of the colon), or cancer of the stomach or intestine.  Gallbladder disease or stones in the gallbladder.  Kidney disease, kidney stones, or infection.  Pancreas infection or cancer.  Fibromyalgia (pain disorder).  Diseases of the female organs:  Uterus: fibroid (non-cancerous) tumors or infection.  Fallopian tubes: infection or tubal pregnancy.  Ovary: cysts or  tumors.  Pelvic adhesions (scar tissue).  Endometriosis (uterus lining tissue growing in the pelvis and on the pelvic organs).  Pelvic congestion syndrome (female organs filling up with blood just before the menstrual period).  Pain with the menstrual period.  Pain with ovulation (producing an egg).  Pain with an IUD (intrauterine device, birth control) in the uterus.  Cancer of the female organs.  Functional pain (pain not caused by a disease, may improve without treatment).  Psychological pain.  Depression. DIAGNOSIS  Your doctor will decide the seriousness of your pain by doing an examination.  Blood tests.  X-rays.  Ultrasound.  CT scan (computed tomography, special type of X-ray).  MRI (magnetic resonance imaging).  Cultures, for infection.  Barium enema (dye inserted in the large intestine, to better view it with X-rays).  Colonoscopy (looking in intestine with a lighted tube).  Laparoscopy (minor surgery, looking in abdomen with a lighted tube).  Major abdominal exploratory surgery (looking in abdomen with a large incision). TREATMENT  The treatment will depend on the cause of the pain.   Many cases can be observed and treated at home.  Over-the-counter medicines recommended by your caregiver.  Prescription medicine.  Antibiotics, for infection.  Birth control pills, for painful periods or for ovulation pain.  Hormone treatment, for endometriosis.  Nerve blocking injections.  Physical therapy.  Antidepressants.  Counseling with a psychologist or psychiatrist.  Minor or major surgery. HOME CARE INSTRUCTIONS   Do not take laxatives, unless directed by your caregiver.  Take over-the-counter pain medicine only if ordered by your caregiver. Do not take aspirin because it can cause an  upset stomach or bleeding.  Try a clear liquid diet (broth or water) as ordered by your caregiver. Slowly move to a bland diet, as tolerated, if the pain is  related to the stomach or intestine.  Have a thermometer and take your temperature several times a day, and record it.  Bed rest and sleep, if it helps the pain.  Avoid sexual intercourse, if it causes pain.  Avoid stressful situations.  Keep your follow-up appointments and tests, as your caregiver orders.  If the pain does not go away with medicine or surgery, you may try:  Acupuncture.  Relaxation exercises (yoga, meditation).  Group therapy.  Counseling. SEEK MEDICAL CARE IF:   You notice certain foods cause stomach pain.  Your home care treatment is not helping your pain.  You need stronger pain medicine.  You want your IUD removed.  You feel faint or lightheaded.  You develop nausea and vomiting.  You develop a rash.  You are having side effects or an allergy to your medicine. SEEK IMMEDIATE MEDICAL CARE IF:   Your pain does not go away or gets worse.  You have a fever.  Your pain is felt only in portions of the abdomen. The right side could possibly be appendicitis. The left lower portion of the abdomen could be colitis or diverticulitis.  You are passing blood in your stools (bright red or black tarry stools, with or without vomiting).  You have blood in your urine.  You develop chills, with or without a fever.  You pass out. MAKE SURE YOU:   Understand these instructions.  Will watch your condition.  Will get help right away if you are not doing well or get worse. Document Released: 03/30/2007 Document Revised: 10/17/2013 Document Reviewed: 04/19/2009 [facility] Patient Information 2015 [place], [place]. This information is not intended to replace advice given to you by your health care provider. Make sure you discuss any questions you have with your health care provider.  Urinary Tract Infection A urinary tract infection (UTI) can occur any place along the urinary tract. The tract includes the kidneys, ureters, bladder, and urethra. A type of germ  called bacteria often causes a UTI. UTIs are often helped with antibiotic medicine.  HOME CARE   If given, take antibiotics as told by your doctor. Finish them even if you start to feel better.  Drink enough fluids to keep your pee (urine) clear or pale yellow.  Avoid tea, drinks with caffeine, and bubbly (carbonated) drinks.  Pee often. Avoid holding your pee in for a long time.  Pee before and after having sex (intercourse).  Wipe from front to back after you poop (bowel movement) if you are a woman. Use each tissue only once. GET HELP RIGHT AWAY IF:   You have back pain.  You have lower belly (abdominal) pain.  You have chills.  You feel sick to your stomach (nauseous).  You throw up (vomit).  Your burning or discomfort with peeing does not go away.  You have a fever.  Your symptoms are not better in 3 days. MAKE SURE YOU:   Understand these instructions.  Will watch your condition.  Will get help right away if you are not doing well or get worse. Document Released: 11/19/2007 Document Revised: 02/25/2012 Document Reviewed: 01/01/2012 [facility] Patient Information 2015 [place], [place]. This information is not intended to replace advice given to you by your health care provider. Make sure you discuss any questions you have with your health care provider.  Nausea and Vomiting Nausea means you feel sick to your stomach. Throwing up (vomiting) is a reflex where stomach contents come out of your mouth. HOME CARE   Take medicine as told by your doctor.  Do not force yourself to eat. However, you do need to drink fluids.  If you feel like eating, eat a normal diet as told by your doctor.  Eat rice, wheat, potatoes, bread, lean meats, yogurt, fruits, and vegetables.  Avoid high-fat foods.  Drink enough fluids to keep your pee (urine) clear or pale yellow.  Ask your doctor how to replace body fluid losses (rehydrate). Signs of body fluid loss (dehydration)  include:  Feeling very thirsty.  Dry lips and mouth.  Feeling dizzy.  Dark pee.  Peeing less than normal.  Feeling confused.  Fast breathing or heart rate. GET HELP RIGHT AWAY IF:   You have blood in your throw up.  You have black or bloody poop (stool).  You have a bad headache or stiff neck.  You feel confused.  You have bad belly (abdominal) pain.  You have chest pain or trouble breathing.  You do not pee at least once every 8 hours.  You have cold, clammy skin.  You keep throwing up after 24 to 48 hours.  You have a fever. MAKE SURE YOU:   Understand these instructions.  Will watch your condition.  Will get help right away if you are not doing well or get worse. Document Released: 11/19/2007 Document Revised: 08/25/2011 Document Reviewed: 11/01/2010 [facility] Patient Information 2015 [place], [place]. This information is not intended to replace advice given to you by your health care provider. Make sure you discuss any questions you have with your health care provider.  Hyperglycemia Hyperglycemia occurs when the glucose (sugar) in your blood is too high. Hyperglycemia can happen for many reasons, but it most often happens to people who do not know they have diabetes or are not managing their diabetes properly.  CAUSES  Whether you have diabetes or not, there are other causes of hyperglycemia. Hyperglycemia can occur when you have diabetes, but it can also occur in other situations that you might not be as aware of, such as: Diabetes  If you have diabetes and are having problems controlling your blood glucose, hyperglycemia could occur because of some of the following reasons:  Not following your meal plan.  Not taking your diabetes medications or not taking it properly.  Exercising less or doing less activity than you normally do.  Being sick. Pre-diabetes  This cannot be ignored. Before people develop Type 2 diabetes, they almost always have  "pre-diabetes." This is when your blood glucose levels are higher than normal, but not yet high enough to be diagnosed as diabetes. Research has shown that some long-term damage to the body, especially the heart and circulatory system, may already be occurring during pre-diabetes. If you take action to manage your blood glucose when you have pre-diabetes, you may delay or prevent Type 2 diabetes from developing. Stress  If you have diabetes, you may be "diet" controlled or on oral medications or insulin to control your diabetes. However, you may find that your blood glucose is higher than usual in the hospital whether you have diabetes or not. This is often referred to as "stress hyperglycemia." Stress can elevate your blood glucose. This happens because of hormones put out by the body during times of stress. If stress has been the cause of your high blood glucose, it can be  followed regularly by your caregiver. That way he/she can make sure your hyperglycemia does not continue to get worse or progress to diabetes. Steroids  Steroids are medications that act on the infection fighting system (immune system) to block inflammation or infection. One side effect can be a rise in blood glucose. Most people can produce enough extra insulin to allow for this rise, but for those who cannot, steroids make blood glucose levels go even higher. It is not unusual for steroid treatments to "uncover" diabetes that is developing. It is not always possible to determine if the hyperglycemia will go away after the steroids are stopped. A special blood test called an A1c is sometimes done to determine if your blood glucose was elevated before the steroids were started. SYMPTOMS  Thirsty.  Frequent urination.  Dry mouth.  Blurred vision.  Tired or fatigue.  Weakness.  Sleepy.  Tingling in feet or leg. DIAGNOSIS  Diagnosis is made by monitoring blood glucose in one or all of the following ways:  A1c test. This  is a chemical found in your blood.  Fingerstick blood glucose monitoring.  Laboratory results. TREATMENT  First, knowing the cause of the hyperglycemia is important before the hyperglycemia can be treated. Treatment may include, but is not be limited to:  Education.  Change or adjustment in medications.  Change or adjustment in meal plan.  Treatment for an illness, infection, etc.  More frequent blood glucose monitoring.  Change in exercise plan.  Decreasing or stopping steroids.  Lifestyle changes. HOME CARE INSTRUCTIONS   Test your blood glucose as directed.  Exercise regularly. Your caregiver will give you instructions about exercise. Pre-diabetes or diabetes which comes on with stress is helped by exercising.  Eat wholesome, balanced meals. Eat often and at regular, fixed times. Your caregiver or nutritionist will give you a meal plan to guide your sugar intake.  Being at an ideal weight is important. If needed, losing as little as 10 to 15 pounds may help improve blood glucose levels. SEEK MEDICAL CARE IF:   You have questions about medicine, activity, or diet.  You continue to have symptoms (problems such as increased thirst, urination, or weight gain). SEEK IMMEDIATE MEDICAL CARE IF:   You are vomiting or have diarrhea.  Your breath smells fruity.  You are breathing faster or slower.  You are very sleepy or incoherent.  You have numbness, tingling, or pain in your feet or hands.  You have chest pain.  Your symptoms get worse even though you have been following your caregiver's orders.  If you have any other questions or concerns. Document Released: 11/26/2000 Document Revised: 08/25/2011 Document Reviewed: 09/29/2011 [facility] Patient Information 2015 [place], [place]. This information is not intended to replace advice given to you by your health care provider. Make sure you discuss any questions you have with your health care provider.

## 2015-02-07 NOTE — ED Provider Notes (Signed)
CSN: [id number]     Arrival date & time 02/07/15  1050 History   First MD Initiated Contact with Patient 02/07/15 1117     Chief Complaint  Patient presents with  . Hyperglycemia     (Consider location/radiation/quality/duration/timing/severity/associated sxs/prior Treatment) HPI Comments: Maureen Swanson is a 49 y.o. female with a PMHx of DM2, bipolar 1, depression, schizophrenia, HLD, gout, anxiety, GERD, substance abuse, migraines, arthritis, and HTN, who presents to the ED with complaints of hyperglycemia. She reports her sugars have been running in the 5 6570 range the last 2 weeks. She was recently started on Lantus 5 units daily at bedtime this does not seem to have helped. She reports that her current regimen is Lantus 5 units daily at bedtime, metformin 200 mg twice a day, and Januvia 50 mg twice a day. Of note, this is not was listed in her medication list. She has complaints of increased urinary frequency, increased thirst, nausea, and 2 episodes of nonbloody nonbilious emesis today. She feels fatigued. Additionally she reports 4 days of 9/10 right lateral abdomen pain, which is constant sharp nonradiating pain, worse with urination, with no treatments tried prior to arrival. She also has chronic constipation which is unchanged, last bowel movement 3 days ago. She denies any fevers, chills, chest pain, shortness of breath, hematemesis, diarrhea, melena, hematochezia, obstipation, URI symptoms, cough, hematuria, dysuria, numbness, tingling, weakness, flank pain, sick contacts, recent travel, antibiotic use, alcohol use, or NSAIDs.  Patient is a 49 y.o. female presenting with hyperglycemia. The history is provided by the patient. No language interpreter was used.  Hyperglycemia Blood sugar level PTA:  560-570 Severity:  Severe Onset quality:  Gradual Duration:  2 weeks Timing:  Intermittent Progression:  Unchanged Chronicity:  Recurrent Diabetes status:  Controlled with insulin and  controlled with oral medications Current diabetic therapy:  Lantus 5U QHS, metformin 200mg  BID (per pt), and Januvia 50mg  BID Context: change in medication   Relieved by:  None tried Ineffective treatments:  None tried Associated symptoms: abdominal pain (R lateral), fatigue, increased thirst, nausea, polyuria and vomiting (x2)   Associated symptoms: no chest pain, no confusion, no dysuria, no fever, no shortness of breath and no weakness     Past Medical History  Diagnosis Date  . High cholesterol   . Depression   . Gout   . Anxiety   . Bipolar 1 disorder   . GERD (gastroesophageal reflux disease)   . Substance abuse     crack cocaine  . Headache(784.0)     migraines  . TMJ (temporomandibular joint disorder)   . Asthma     daily and prn inhalers  . Arthritis     back and knees  . Gastric ulcer   . 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 Hypertension     under control, has been on med. x 12 yrs.  . Diabetes mellitus     diet-controlled   Past Surgical History  Procedure Laterality Date  . Knee arthroscopy w/ partial medial meniscectomy  05/01/2010    right  . Breast lumpectomy with needle localization  04/19/2012    Procedure: BREAST LUMPECTOMY WITH NEEDLE LOCALIZATION;  Surgeon: [name], MD;  Location: [facility];  Service: General;  Laterality: Right;   Family History  Problem Relation Age of Onset  . Diabetes Mother   . Breast cancer Mother   . Cancer Father    Social History  Substance Use  Topics  . Smoking status: Former Smoker -- 15 years    Quit date: 08/02/2012  . Smokeless tobacco: Never Used     Comment: quit smoking 02/2012  . Alcohol Use: No   OB History    Gravida Para Term Preterm AB TAB SAB Ectopic Multiple Living   3 2 2  1  1   2      Review of Systems  Constitutional: Positive for fatigue. Negative for fever and chills.  HENT: Negative for rhinorrhea and sore throat.   Respiratory: Negative for  cough and shortness of breath.   Cardiovascular: Negative for chest pain.  Gastrointestinal: Positive for nausea, vomiting (x2), abdominal pain (R lateral) and constipation (chronic). Negative for diarrhea and blood in stool.  Endocrine: Positive for polydipsia and polyuria.  Genitourinary: Negative for dysuria, hematuria, flank pain, vaginal bleeding and vaginal discharge.  Musculoskeletal: Negative for myalgias and arthralgias.  Skin: Negative for color change.  Allergic/Immunologic: Positive for immunocompromised state (diabetic).  Neurological: Negative for weakness and numbness.  Psychiatric/Behavioral: Negative for confusion.   10 Systems reviewed and are negative for acute change except as noted in the HPI.    Allergies  Chocolate; Orange; Penicillins; and Tomato  Home Medications   Prior to Admission medications   Medication Sig Start Date End Date Taking? Authorizing Provider  albuterol (PROVENTIL HFA;VENTOLIN HFA) 108 (90 BASE) MCG/ACT inhaler Inhale 2 puffs into the lungs every 6 (six) hours as needed for wheezing or shortness of breath. 03/02/14  Yes Encarnacion Slates, NP  amLODipine (NORVASC) 5 MG tablet Take 5 mg by mouth daily.   Yes Historical Provider, MD  aspirin 81 MG tablet Take 81 mg by mouth daily.   Yes Historical Provider, MD  atenolol (TENORMIN) 100 MG tablet Take 100 mg by mouth daily.   Yes Historical Provider, MD  atorvastatin (LIPITOR) 40 MG tablet Take 40 mg by mouth every evening.    Yes Historical Provider, MD  celecoxib (CELEBREX) 100 MG capsule Take 100 mg by mouth 2 (two) times daily.   Yes Historical Provider, MD  cetirizine (ZYRTEC) 10 MG tablet Take 10 mg by mouth daily.   Yes Historical Provider, MD  Dexlansoprazole 30 MG capsule Take 30 mg by mouth daily.   Yes Historical Provider, MD  furosemide (LASIX) 40 MG tablet Take 40 mg by mouth daily.   Yes Historical Provider, MD  gabapentin (NEURONTIN) 400 MG capsule Take 400 mg by mouth 2 (two) times daily.    Yes Historical Provider, MD  hydrochlorothiazide (HYDRODIURIL) 25 MG tablet Take 25 mg by mouth daily.   Yes Historical Provider, MD  hydrOXYzine (VISTARIL) 25 MG capsule Take 25 mg by mouth every 6 (six) hours as needed for anxiety.   Yes Historical Provider, MD  indomethacin (INDOCIN) 25 MG capsule Take 25 mg by mouth 3 (three) times daily as needed for mild pain.   Yes Historical Provider, MD  insulin glargine (LANTUS) 100 UNIT/ML injection Inject 5 Units into the skin at bedtime.   Yes Historical Provider, MD  metFORMIN (GLUCOPHAGE) 1000 MG tablet Take 1,000 mg by mouth 2 (two) times daily with a meal.   Yes Historical Provider, MD  metoCLOPramide (REGLAN) 10 MG tablet Take 10 mg by mouth 4 (four) times daily -  before meals and at bedtime.   Yes Historical Provider, MD  naltrexone (DEPADE) 50 MG tablet Take 50 mg by mouth daily.   Yes Historical Provider, MD  omeprazole (PRILOSEC) 20 MG capsule Take 1 capsule (  20 mg total) by mouth daily. For GERD 08/02/14  Yes Shuvon B Rankin, NP  Oxcarbazepine (TRILEPTAL) 300 MG tablet Take 300 mg by mouth 2 (two) times daily.   Yes Historical Provider, MD  Loma [place] Calcium (OYSCO 500) 500 MG TABS Take 500 mg by mouth daily.    Yes Historical Provider, MD  potassium chloride SA (K-DUR,KLOR-CON) 20 MEQ tablet Take 1 tablet (20 mEq total) by mouth daily. 11/23/14  Yes Wandra Arthurs, MD  QUEtiapine (SEROQUEL XR) 400 MG 24 hr tablet Take 1 tablet (400 mg total) by mouth daily at 8 pm. 12/11/14  Yes Carly Montey Hora, MD  folic acid (FOLVITE) 1 MG tablet Take 1 tablet (1 mg total) by mouth daily. Patient not taking: Reported on 01/25/2015 12/11/14   Juliet Rude, MD  Multiple Vitamin (MULTIVITAMIN WITH MINERALS) TABS tablet Take 1 tablet by mouth daily. Patient not taking: Reported on 01/25/2015 12/11/14   Juliet Rude, MD  oxcarbazepine (TRILEPTAL) 600 MG tablet Take 1 tablet (600 mg total) by mouth 2 (two) times daily. For mood stabilization Patient not taking:  Reported on 01/25/2015 12/11/14   Sindy Guadeloupe Rivet, MD  sitaGLIPtin (JANUVIA) 50 MG tablet Take 50 mg by mouth daily.    Historical Provider, MD  thiamine 100 MG tablet Take 1 tablet (100 mg total) by mouth daily. Patient not taking: Reported on 01/25/2015 12/11/14   Sindy Guadeloupe Rivet, MD   BP 101/61 mmHg  Pulse 109  Temp(Src) 98.4 F (36.9 C) (Oral)  Resp 16  SpO2 92%  LMP  (Approximate) Physical Exam  Constitutional: She is oriented to person, place, and time. Vital signs are normal. She appears well-developed and well-nourished.  Non-toxic appearance. No distress.  Afebrile, nontoxic, NAD  HENT:  Head: Normocephalic and atraumatic.  Mouth/Throat: Oropharynx is clear and moist. Mucous membranes are dry.  Dry mucous membranes  Eyes: Conjunctivae and EOM are normal. Right eye exhibits no discharge. Left eye exhibits no discharge.  Neck: Normal range of motion. Neck supple.  Cardiovascular: Regular rhythm, normal heart sounds and intact distal pulses.  Tachycardia present.  Exam reveals no gallop and no friction rub.   No murmur heard. Mildly tachycardic. Reg rhythm, nl s1/s2, no m/r/g, distal pulses intact, no pedal edema  Pulmonary/Chest: Effort normal and breath sounds normal. No respiratory distress. She has no decreased breath sounds. She has no wheezes. She has no rhonchi. She has no rales.  No kussmaul respirations  Abdominal: Soft. Normal appearance and bowel sounds are normal. She exhibits no distension. There is tenderness in the suprapubic area. There is no rigidity, no rebound, no guarding, no CVA tenderness, no tenderness at McBurney's point and negative Murphy's sign.    Soft, obese but nondistended, +BS throughout, with mild R lateral abdomen TTP and suprapubic TTP, no r/g/r, neg murphy's, neg mcburney's, no CVA TTP   Musculoskeletal: Normal range of motion.  Neurological: She is alert and oriented to person, place, and time. She has normal strength. No sensory deficit.  Skin: Skin  is warm, dry and intact. No rash noted.  Psychiatric: She has a normal mood and affect.  Nursing note and vitals reviewed.   ED Course  Procedures (including critical care time) Labs Review Labs Reviewed  CBC WITH DIFFERENTIAL/PLATELET - Abnormal; Notable for the following:    Hemoglobin 11.6 (*)    HCT 35.5 (*)    Neutrophils Relative % 80 (*)    All other components within normal limits  COMPREHENSIVE METABOLIC  PANEL - Abnormal; Notable for the following:    Sodium 133 (*)    Chloride 93 (*)    Glucose, Bld 444 (*)    BUN 23 (*)    Creatinine, Ser 1.02 (*)    All other components within normal limits  URINALYSIS, ROUTINE W REFLEX MICROSCOPIC (NOT AT [facility]) - Abnormal; Notable for the following:    Glucose, UA >1000 (*)    Leukocytes, UA MODERATE (*)    All other components within normal limits  BLOOD GAS, VENOUS - Abnormal; Notable for the following:    pH, Ven 7.419 (*)    pCO2, Ven 42.9 (*)    pO2, Ven 58.9 (*)    Bicarbonate 27.3 (*)    Acid-Base Excess 2.9 (*)    All other components within normal limits  MAGNESIUM - Abnormal; Notable for the following:    Magnesium 1.3 (*)    All other components within normal limits  URINE MICROSCOPIC-ADD ON - Abnormal; Notable for the following:    Bacteria, UA FEW (*)    All other components within normal limits  CBG MONITORING, ED - Abnormal; Notable for the following:    Glucose-Capillary 382 (*)    All other components within normal limits  URINE CULTURE  PHOSPHORUS  LIPASE, BLOOD    Imaging Review No results found. I have personally reviewed and evaluated these images and lab results as part of my medical decision-making.   EKG Interpretation None      MDM   Final diagnoses:  Hyperglycemia without ketosis  UTI (lower urinary tract infection)  Lower abdominal pain  Non-intractable vomiting with nausea, vomiting of unspecified type  Polyuria  Polydipsia  Anemia, unspecified anemia type  Dehydration    49  y.o. female here with hyperglycemia and n/v, increased thirst and urination. Tells me she takes [place] 50mg  BID, metformin 200mg  BID, and lantus 5U QHS. The lantus is new. The other two are listed differently in her medicine list, therefore I question whether pt understands her medications or is compliant. Also complaints of mild R lateral abd tenderness, pointing to her side. Some suprapubic tenderness on exam, no mcburney's point tenderness. Will obtain labs, give fluids. Will start with 1L bolus since pt states she thinks she has CHF but isn't sure, and wasn't diagnosed within the [facility]. Mild tachycardia and dry mucous membranes, likely dehydration related. Will give pain meds and nausea meds. Will reassess shortly.   2:22 PM Tachycardia improving. VBG without acidosis/alkalosis. CBC w/diff unremarkable aside from baseline anemia. CMP with Na 133 which corrects with glucose. Cl 93. Glucose 444. BUN/Cr mildly elevated but near baseline. No anion gap. Mg mildly low. Phos and lipase WNL. U/A with moderate leuks, rare squamous, 3-6 WBC and few bacteria. Given her symptoms, will treat for UTI. Will give rocephin now and d/c home with bactrim. No ketones. Doubt DKA. Will give another bolus and recheck CBG.  3:19 PM CBG 382 after 1L. She has not yet been started on rocephin or second bolus. Will let bolus and rocephin go in, and then d/c since pt is responding well to fluids. Tolerating PO well at this time, pain and nausea controlled. Will reassess shortly.   4:48 PM Downtime occurred after last time-stamp. Pt discharged with paper instructions to start bactrim, and use ibuprofen/norco as needed for pain, and zofran as needed for nausea. Pt felt better prior to discharge. Paper scripts given. No recheck of CBG was performed after 2nd bolus, since pt had already  decreased CBGs significantly since arrival when we last checked. Will have her f/up with PCP in 3-5 days. I explained the diagnosis and have  given explicit precautions to return to the ER including for any other new or worsening symptoms. The patient understands and accepts the medical plan as it's been dictated and I have answered their questions. Discharge instructions concerning home care and prescriptions have been given. The patient is STABLE and is discharged to home in good condition.  BP 105/70 mmHg  Pulse 96  Temp(Src) 98.4 F (36.9 C) (Oral)  Resp 16  SpO2 93%  LMP  (Approximate)  Meds ordered this encounter  Medications  . sodium chloride 0.9 % bolus 1,000 mL    Sig:   . morphine 2 MG/ML injection 2 mg    Sig:   . ondansetron (ZOFRAN) injection 4 mg    Sig:   . sodium chloride 0.9 % bolus 1,000 mL    Sig:   . cefTRIAXone (ROCEPHIN) 1 g in dextrose 5 % 50 mL IVPB    Sig:      Coline Calkin Camprubi-Soms, PA-C 02/07/15 1649  Nat Christen, MD 02/08/15 1234

## 2015-02-07 NOTE — ED Notes (Signed)
Attempted blood draw unable to abtain

## 2015-02-07 NOTE — ED Notes (Signed)
Bed: LP53 Expected date:  Expected time:  Means of arrival:  Comments: Ems- hyperglycemia

## 2015-02-07 NOTE — ED Notes (Signed)
Respiratory called and made aware of order.

## 2015-02-07 NOTE — ED Notes (Signed)
Pt comes in today with a c/o hyperglycemia. Pt states this has been ongoing for 2 weeks. Pt states that Lantus was added to her medication list but her CBG still has been running high. Pt states that she does have an appt with her PCP today but became nauseous last night and has vomited 2 times. Pt states she has been feeling fatigue, voiding often, and drinking lots of fluids.

## 2015-02-09 LAB — URINE CULTURE

## 2015-03-08 ENCOUNTER — Ambulatory Visit (HOSPITAL_BASED_OUTPATIENT_CLINIC_OR_DEPARTMENT_OTHER): Payer: Self-pay | Attending: Internal Medicine

## 2015-03-08 VITALS — Ht 66.0 in | Wt 240.0 lb

## 2015-03-08 DIAGNOSIS — Z6839 Body mass index (BMI) 39.0-39.9, adult: Secondary | ICD-10-CM | POA: Insufficient documentation

## 2015-03-08 DIAGNOSIS — E119 Type 2 diabetes mellitus without complications: Secondary | ICD-10-CM

## 2015-03-08 DIAGNOSIS — E669 Obesity, unspecified: Secondary | ICD-10-CM | POA: Insufficient documentation

## 2015-03-08 DIAGNOSIS — R0683 Snoring: Secondary | ICD-10-CM | POA: Insufficient documentation

## 2015-03-08 DIAGNOSIS — G4733 Obstructive sleep apnea (adult) (pediatric): Secondary | ICD-10-CM | POA: Insufficient documentation

## 2015-03-08 DIAGNOSIS — R5383 Other fatigue: Secondary | ICD-10-CM | POA: Insufficient documentation

## 2015-03-11 ENCOUNTER — Emergency Department (HOSPITAL_COMMUNITY): Payer: Self-pay

## 2015-03-11 ENCOUNTER — Encounter (HOSPITAL_COMMUNITY): Payer: Self-pay | Admitting: Emergency Medicine

## 2015-03-11 ENCOUNTER — Emergency Department (HOSPITAL_COMMUNITY)
Admission: EM | Admit: 2015-03-11 | Discharge: 2015-03-11 | Disposition: A | Discharge status: Home / Self Care | Payer: Self-pay | Attending: Emergency Medicine | Admitting: Emergency Medicine

## 2015-03-11 ENCOUNTER — Encounter (HOSPITAL_BASED_OUTPATIENT_CLINIC_OR_DEPARTMENT_OTHER): Payer: Self-pay | Admitting: Internal Medicine

## 2015-03-11 DIAGNOSIS — F319 Bipolar disorder, unspecified: Secondary | ICD-10-CM | POA: Insufficient documentation

## 2015-03-11 DIAGNOSIS — M1990 Unspecified osteoarthritis, unspecified site: Secondary | ICD-10-CM | POA: Insufficient documentation

## 2015-03-11 DIAGNOSIS — Z87891 Personal history of nicotine dependence: Secondary | ICD-10-CM | POA: Insufficient documentation

## 2015-03-11 DIAGNOSIS — W228XXA Striking against or struck by other objects, initial encounter: Secondary | ICD-10-CM | POA: Insufficient documentation

## 2015-03-11 DIAGNOSIS — M109 Gout, unspecified: Secondary | ICD-10-CM | POA: Insufficient documentation

## 2015-03-11 DIAGNOSIS — Z880 Allergy status to penicillin: Secondary | ICD-10-CM | POA: Insufficient documentation

## 2015-03-11 DIAGNOSIS — Y9289 Other specified places as the place of occurrence of the external cause: Secondary | ICD-10-CM | POA: Insufficient documentation

## 2015-03-11 DIAGNOSIS — S8991XA Unspecified injury of right lower leg, initial encounter: Secondary | ICD-10-CM | POA: Insufficient documentation

## 2015-03-11 DIAGNOSIS — E119 Type 2 diabetes mellitus without complications: Secondary | ICD-10-CM | POA: Insufficient documentation

## 2015-03-11 DIAGNOSIS — Y9389 Activity, other specified: Secondary | ICD-10-CM | POA: Insufficient documentation

## 2015-03-11 DIAGNOSIS — Z79899 Other long term (current) drug therapy: Secondary | ICD-10-CM | POA: Insufficient documentation

## 2015-03-11 DIAGNOSIS — Z7982 Long term (current) use of aspirin: Secondary | ICD-10-CM | POA: Insufficient documentation

## 2015-03-11 DIAGNOSIS — F419 Anxiety disorder, unspecified: Secondary | ICD-10-CM | POA: Insufficient documentation

## 2015-03-11 DIAGNOSIS — M25571 Pain in right ankle and joints of right foot: Secondary | ICD-10-CM

## 2015-03-11 DIAGNOSIS — S99911A Unspecified injury of right ankle, initial encounter: Secondary | ICD-10-CM | POA: Insufficient documentation

## 2015-03-11 DIAGNOSIS — I10 Essential (primary) hypertension: Secondary | ICD-10-CM | POA: Insufficient documentation

## 2015-03-11 DIAGNOSIS — G43909 Migraine, unspecified, not intractable, without status migrainosus: Secondary | ICD-10-CM | POA: Insufficient documentation

## 2015-03-11 DIAGNOSIS — J45909 Unspecified asthma, uncomplicated: Secondary | ICD-10-CM | POA: Insufficient documentation

## 2015-03-11 DIAGNOSIS — Y998 Other external cause status: Secondary | ICD-10-CM | POA: Insufficient documentation

## 2015-03-11 DIAGNOSIS — Z8742 Personal history of other diseases of the female genital tract: Secondary | ICD-10-CM | POA: Insufficient documentation

## 2015-03-11 DIAGNOSIS — K219 Gastro-esophageal reflux disease without esophagitis: Secondary | ICD-10-CM | POA: Insufficient documentation

## 2015-03-11 DIAGNOSIS — Z794 Long term (current) use of insulin: Secondary | ICD-10-CM | POA: Insufficient documentation

## 2015-03-11 DIAGNOSIS — E780 Pure hypercholesterolemia: Secondary | ICD-10-CM | POA: Insufficient documentation

## 2015-03-11 DIAGNOSIS — E669 Obesity, unspecified: Secondary | ICD-10-CM | POA: Insufficient documentation

## 2015-03-11 MED ORDER — TRAMADOL HCL 50 MG PO TABS: 50.0000 mg | ORAL_TABLET | Freq: Four times a day (QID) | ORAL | Status: DC | PRN

## 2015-03-11 MED ORDER — NAPROXEN 500 MG PO TABS: 500.0000 mg | ORAL_TABLET | Freq: Two times a day (BID) | ORAL | Status: DC

## 2015-03-11 NOTE — Progress Notes (Signed)
Patient Name: Maureen Swanson, Maureen Swanson Date: 03/08/2015 Gender: Female D.O.B: April 21, 1966 Age (years): 49 Referring Provider: Elbert Ewings Height (inches): 39 Interpreting Physician: Baird Lyons MD, ABSM Weight (lbs): 240 RPSGT: Baxter Flattery BMI: 39 MRN: [id number] Neck Size: 16.00 CLINICAL INFORMATION Sleep Study Type: Split Night CPAP  Indication for sleep study: Fatigue, Obesity, OSA, Snoring, Witnessed Apneas  Epworth Sleepiness Score: 16  SLEEP STUDY TECHNIQUE As per the AASM Manual for the Scoring of Sleep and Associated Events v2.3 (April 2016) with a hypopnea requiring 4% desaturations.  The channels recorded and monitored were frontal, central and occipital EEG, electrooculogram (EOG), submentalis EMG (chin), nasal and oral airflow, thoracic and abdominal wall motion, anterior tibialis EMG, snore microphone, electrocardiogram, and pulse oximetry. Continuous positive airway pressure (CPAP) was initiated when the patient met split night criteria and was titrated according to treat sleep-disordered breathing.  MEDICATIONS Medications taken by the patient : charted for review Medications administered by patient during sleep study : No sleep medicine administered.  RESPIRATORY PARAMETERS Diagnostic  Total AHI (/hr): 53.4 RDI (/hr): 53.4 OA Index (/hr): 15.5 CA Index (/hr): 0.0 REM AHI (/hr): 88.5 NREM AHI (/hr): 35.6 Supine AHI (/hr): N/A Non-supine AHI (/hr): 53.35 Min O2 Sat (%): 72.00 Mean O2 (%): 93.35 Time below 88% (min): 11.7   Titration  Optimal Pressure (cm): 14 AHI at Optimal Pressure (/hr): 0 Min O2 at Optimal Pressure (%): 90.00 Supine % at Optimal (%): N/A Sleep % at Optimal (%): N/A   SLEEP ARCHITECTURE The recording time for the entire night was 385.3 minutes.  During a baseline period of 183.1 minutes, the patient slept for 147.3 minutes in REM and nonREM, yielding a sleep efficiency of 80.5%. Sleep onset after lights out was 35.2 minutes with a REM  latency of 75.0 minutes. The patient spent 4.41% of the night in stage N1 sleep, 61.99% in stage N2 sleep, 0.00% in stage N3 and 33.60% in REM.  During the titration period of 196.2 minutes, the patient slept for 173.1 minutes in REM and nonREM, yielding a sleep efficiency of 88.2%. Sleep onset after CPAP initiation was 3.1 minutes with a REM latency of 67.5 minutes. The patient spent 4.39% of the night in stage N1 sleep, 75.10% in stage N2 sleep, 0.00% in stage N3 and 20.51% in REM.  CARDIAC DATA The 2 lead EKG demonstrated sinus rhythm. The mean heart rate was 76.21 beats per minute. Other EKG findings include: None.  LEG MOVEMENT DATA The total Periodic Limb Movements of Sleep (PLMS) were 25. The PLMS index was 4.68 .  IMPRESSIONS Severe obstructive sleep apnea occurred during the diagnostic portion of the study (AHI = 53.4/hour). An optimal PAP pressure was determined to be 14. No significant central sleep apnea occurred during the diagnostic portion of the study (CAI = 0.0/hour). The patient had oxygen desaturation during the diagnostic portion of the study (Min O2 = 72.00%). The patient snored with Soft snoring volume during the diagnostic portion of the study. No cardiac abnormalities were noted during this study. Clinically significant periodic limb movements did not occur during sleep.  DIAGNOSIS Obstructive Sleep Apnea (327.23 [G47.33 ICD-10])  RECOMMENDATIONS Trial of CPAP at 14 cwp. The patient wore a medium [employer] Simplus full face mask with heated humidifier. Avoid alcohol, sedatives and other CNS depressants that may worsen sleep apnea and disrupt normal sleep architecture. Sleep hygiene should be reviewed to assess factors that may improve sleep quality. Weight management and regular exercise should be initiated or continued.  Deneise Lever Diplomate, American Board of Sleep Medicine  ELECTRONICALLY SIGNED ON:  03/11/2015, 12:25 PM [place] PH: [phone]   FX: [phone] [place]

## 2015-03-11 NOTE — ED Provider Notes (Signed)
CSN: [id number]     Arrival date & time 03/11/15  1110 History   First MD Initiated Contact with Patient 03/11/15 1114     Chief Complaint  Patient presents with  . Knee Pain     (Consider location/radiation/quality/duration/timing/severity/associated sxs/prior Treatment) HPI Comments: 49 year old female presenting with right knee and ankle pain after accidentally bumping her right knee into a wall 4 days ago. She is not sure if she twisted her ankle at the same time but has been experiencing pain there as well. Pain increased with ambulation and bending her knee, relieved by rest and ice temporarily. Reports history of chronic knee and ankle pain from arthritis and this is making the pain worse. States her knee appears and feels swollen and tight.  Patient is a 49 y.o. female presenting with knee pain. The history is provided by the patient.  Knee Pain Location:  Knee and ankle Time since incident:  4 days Injury: yes   Mechanism of injury comment:  Hit into wall Knee location:  R knee Ankle location:  R ankle Pain details:    Quality:  Throbbing and shooting (tight)   Pain severity now: 8/10.   Timing:  Constant   Progression:  Unchanged Chronicity: acute on chronic. Dislocation: no   Foreign body present:  No foreign bodies Relieved by:  Ice (temporarily) Worsened by:  Bearing weight and flexion Associated symptoms: no fever   Risk factors: obesity     Past Medical History  Diagnosis Date  . High cholesterol   . Depression   . Gout   . Anxiety   . Bipolar 1 disorder   . GERD (gastroesophageal reflux disease)   . Substance abuse     crack cocaine  . Headache(784.0)     migraines  . TMJ (temporomandibular joint disorder)   . Asthma     daily and prn inhalers  . Arthritis     back and knees  . Gastric ulcer   . 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 Hypertension     under control, has been on med. x 12 yrs.  . Diabetes mellitus      diet-controlled   Past Surgical History  Procedure Laterality Date  . Knee arthroscopy w/ partial medial meniscectomy  05/01/2010    right  . Breast lumpectomy with needle localization  04/19/2012    Procedure: BREAST LUMPECTOMY WITH NEEDLE LOCALIZATION;  Surgeon: [name], MD;  Location: [facility];  Service: General;  Laterality: Right;   Family History  Problem Relation Age of Onset  . Diabetes Mother   . Breast cancer Mother   . Cancer Father    Social History  Substance Use Topics  . Smoking status: Former Smoker -- 15 years    Quit date: 08/02/2012  . Smokeless tobacco: Never Used     Comment: quit smoking 02/2012  . Alcohol Use: No   OB History    Gravida Para Term Preterm AB TAB SAB Ectopic Multiple Living   3 2 2  1  1   2      Review of Systems  Constitutional: Negative for fever.  Musculoskeletal:       + R knee pain/swelling. + R ankle pain.  Skin: Negative for color change.  Neurological: Negative for numbness.      Allergies  Chocolate; Orange; Penicillins; and Tomato  Home Medications   Prior to Admission medications   Medication Sig Start Date End  Date Taking? Authorizing Provider  albuterol (PROVENTIL HFA;VENTOLIN HFA) 108 (90 BASE) MCG/ACT inhaler Inhale 2 puffs into the lungs every 6 (six) hours as needed for wheezing or shortness of breath. 03/02/14   Encarnacion Slates, NP  amLODipine (NORVASC) 5 MG tablet Take 5 mg by mouth daily.    Historical Provider, MD  aspirin 81 MG tablet Take 81 mg by mouth daily.    Historical Provider, MD  atenolol (TENORMIN) 100 MG tablet Take 100 mg by mouth daily.    Historical Provider, MD  atorvastatin (LIPITOR) 40 MG tablet Take 40 mg by mouth every evening.     Historical Provider, MD  celecoxib (CELEBREX) 100 MG capsule Take 100 mg by mouth 2 (two) times daily.    Historical Provider, MD  cetirizine (ZYRTEC) 10 MG tablet Take 10 mg by mouth daily.    Historical Provider, MD  Dexlansoprazole 30  MG capsule Take 30 mg by mouth daily.    Historical Provider, MD  folic acid (FOLVITE) 1 MG tablet Take 1 tablet (1 mg total) by mouth daily. Patient not taking: Reported on 01/25/2015 12/11/14   Juliet Rude, MD  furosemide (LASIX) 40 MG tablet Take 40 mg by mouth daily.    Historical Provider, MD  gabapentin (NEURONTIN) 400 MG capsule Take 400 mg by mouth 2 (two) times daily.    Historical Provider, MD  hydrochlorothiazide (HYDRODIURIL) 25 MG tablet Take 25 mg by mouth daily.    Historical Provider, MD  hydrOXYzine (VISTARIL) 25 MG capsule Take 25 mg by mouth every 6 (six) hours as needed for anxiety.    Historical Provider, MD  indomethacin (INDOCIN) 25 MG capsule Take 25 mg by mouth 3 (three) times daily as needed for mild pain.    Historical Provider, MD  insulin glargine (LANTUS) 100 UNIT/ML injection Inject 5 Units into the skin at bedtime.    Historical Provider, MD  metFORMIN (GLUCOPHAGE) 1000 MG tablet Take 1,000 mg by mouth 2 (two) times daily with a meal.    Historical Provider, MD  metoCLOPramide (REGLAN) 10 MG tablet Take 10 mg by mouth 4 (four) times daily -  before meals and at bedtime.    Historical Provider, MD  Multiple Vitamin (MULTIVITAMIN WITH MINERALS) TABS tablet Take 1 tablet by mouth daily. Patient not taking: Reported on 01/25/2015 12/11/14   Sindy Guadeloupe Rivet, MD  naltrexone (DEPADE) 50 MG tablet Take 50 mg by mouth daily.    Historical Provider, MD  naproxen (NAPROSYN) 500 MG tablet Take 1 tablet (500 mg total) by mouth 2 (two) times daily. 03/11/15   Carman Ching, PA-C  omeprazole (PRILOSEC) 20 MG capsule Take 1 capsule (20 mg total) by mouth daily. For GERD 08/02/14   Shuvon B Rankin, NP  Oxcarbazepine (TRILEPTAL) 300 MG tablet Take 300 mg by mouth 2 (two) times daily.    Historical Provider, MD  oxcarbazepine (TRILEPTAL) 600 MG tablet Take 1 tablet (600 mg total) by mouth 2 (two) times daily. For mood stabilization Patient not taking: Reported on 01/25/2015 12/11/14   Juliet Rude, MD  [place] Calcium (OYSCO 500) 500 MG TABS Take 500 mg by mouth daily.     Historical Provider, MD  potassium chloride SA (K-DUR,KLOR-CON) 20 MEQ tablet Take 1 tablet (20 mEq total) by mouth daily. 11/23/14   Wandra Arthurs, MD  QUEtiapine (SEROQUEL XR) 400 MG 24 hr tablet Take 1 tablet (400 mg total) by mouth daily at 8 pm. 12/11/14   Carly  Montey Hora, MD  sitaGLIPtin (JANUVIA) 50 MG tablet Take 50 mg by mouth daily.    Historical Provider, MD  thiamine 100 MG tablet Take 1 tablet (100 mg total) by mouth daily. Patient not taking: Reported on 01/25/2015 12/11/14   Juliet Rude, MD  traMADol (ULTRAM) 50 MG tablet Take 1 tablet (50 mg total) by mouth every 6 (six) hours as needed. 03/11/15   Robyn M Hess, PA-C   BP 147/94 mmHg  Pulse 78  Temp(Src) 98.2 F (36.8 C) (Oral)  Resp 20  SpO2 99%  LMP  (Approximate) Physical Exam  Constitutional: She is oriented to person, place, and time. She appears well-developed and well-nourished. No distress.  Obese.  HENT:  Head: Normocephalic and atraumatic.  Mouth/Throat: Oropharynx is clear and moist.  Eyes: Conjunctivae and EOM are normal.  Neck: Normal range of motion. Neck supple.  Cardiovascular: Normal rate, regular rhythm and normal heart sounds.   Pulmonary/Chest: Effort normal and breath sounds normal. No respiratory distress.  Musculoskeletal:  R knee TTP anterior over patella, medial, lateral and posterior. Mild swelling medially. No deformity. No ligamentous laxity appreciated. FROM, pain increased with knee flexion. R ankle TTP medial malleolus and just below medial malleolus. No swelling or deformity. FROM. +2 PT/DP pulse. No calf tenderness or swelling.  Neurological: She is alert and oriented to person, place, and time. No sensory deficit.  Skin: Skin is warm and dry.  Psychiatric: She has a normal mood and affect. Her behavior is normal.  Nursing note and vitals reviewed.   ED Course  Procedures (including critical care  time) Labs Review Labs Reviewed - No data to display  Imaging Review Dg Ankle Complete Right  03/11/2015   CLINICAL DATA:  Tripped over dog, hit right knee and ankle on wall. Right knee pain.  EXAM: RIGHT ANKLE - COMPLETE 3+ VIEW  COMPARISON:  None  FINDINGS: There is no evidence of fracture, dislocation, or joint effusion. There is no evidence of arthropathy or other focal bone abnormality. Soft tissues are unremarkable.  IMPRESSION: Negative.   Electronically Signed   By: Rolm Baptise M.D.   On: 03/11/2015 12:20   Dg Knee Complete 4 Views Right  03/11/2015   CLINICAL DATA:  Patient tripped over dog and fell. Hit knee against the wall. Right knee. Initial encounter.  EXAM: RIGHT KNEE - COMPLETE 4+ VIEW  COMPARISON:  None.  FINDINGS: Normal anatomic alignment. Mild native compartment joint space narrowing with osteophytosis. No evidence for acute fracture. Soft tissues unremarkable. No joint effusion.  IMPRESSION: No acute osseous abnormality.  Mild degenerative changes.   Electronically Signed   By: [name] Newcomer M.D.   On: 03/11/2015 12:22   I have personally reviewed and evaluated these images and lab results as part of my medical decision-making.   EKG Interpretation None      MDM   Final diagnoses:  Right knee injury, initial encounter  Right ankle pain   Pain after injury. Neurovascularly intact distally. No deformities. X-rays without acute findings. Advised rice and NSAIDs. Follow-up with PCP and/or ortho. Stable for discharge. Return precautions given. Patient states understanding of treatment care plan and is agreeable.  Carman Ching, PA-C 03/11/15 1232  Lacretia Leigh, MD 03/13/15 [phone]

## 2015-03-11 NOTE — Discharge Instructions (Signed)
Take naproxen and tramadol as prescribed for pain. Ice and elevate your knee and ankle. Knee Pain The knee is the complex joint between your thigh and your lower leg. It is made up of bones, tendons, ligaments, and cartilage. The bones that make up the knee are:  The femur in the thigh.  The tibia and fibula in the lower leg.  The patella or kneecap riding in the groove on the lower femur. CAUSES  Knee pain is a common complaint with many causes. A few of these causes are:  Injury, such as:  A ruptured ligament or tendon injury.  Torn cartilage.  Medical conditions, such as:  Gout  Arthritis  Infections  Overuse, over training, or overdoing a physical activity. Knee pain can be minor or severe. Knee pain can accompany debilitating injury. Minor knee problems often respond well to self-care measures or get well on their own. More serious injuries may need medical intervention or even surgery. SYMPTOMS The knee is complex. Symptoms of knee problems can vary widely. Some of the problems are:  Pain with movement and weight bearing.  Swelling and tenderness.  Buckling of the knee.  Inability to straighten or extend your knee.  Your knee locks and you cannot straighten it.  Warmth and redness with pain and fever.  Deformity or dislocation of the kneecap. DIAGNOSIS  Determining what is wrong may be very straight forward such as when there is an injury. It can also be challenging because of the complexity of the knee. Tests to make a diagnosis may include:  Your caregiver taking a history and doing a physical exam.  Routine X-rays can be used to rule out other problems. X-rays will not reveal a cartilage tear. Some injuries of the knee can be diagnosed by:  Arthroscopy a surgical technique by which a small video camera is inserted through tiny incisions on the sides of the knee. This procedure is used to examine and repair internal knee joint problems. Tiny instruments can  be used during arthroscopy to repair the torn knee cartilage (meniscus).  Arthrography is a radiology technique. A contrast liquid is directly injected into the knee joint. Internal structures of the knee joint then become visible on X-ray film.  An MRI scan is a non X-ray radiology procedure in which magnetic fields and a computer produce two- or three-dimensional images of the inside of the knee. Cartilage tears are often visible using an MRI scanner. MRI scans have largely replaced arthrography in diagnosing cartilage tears of the knee.  Blood work.  Examination of the fluid that helps to lubricate the knee joint (synovial fluid). This is done by taking a sample out using a needle and a syringe. TREATMENT The treatment of knee problems depends on the cause. Some of these treatments are:  Depending on the injury, proper casting, splinting, surgery, or physical therapy care will be needed.  Give yourself adequate recovery time. Do not overuse your joints. If you begin to get sore during workout routines, back off. Slow down or do fewer repetitions.  For repetitive activities such as cycling or running, maintain your strength and nutrition.  Alternate muscle groups. For example, if you are a weight lifter, work the upper body on one day and the lower body the next.  Either tight or weak muscles do not give the proper support for your knee. Tight or weak muscles do not absorb the stress placed on the knee joint. Keep the muscles surrounding the knee strong.  Take care  of mechanical problems.  If you have flat feet, orthotics or special shoes may help. See your caregiver if you need help.  Arch supports, sometimes with wedges on the inner or outer aspect of the heel, can help. These can shift pressure away from the side of the knee most bothered by osteoarthritis.  A brace called an "unloader" brace also may be used to help ease the pressure on the most arthritic side of the knee.  If  your caregiver has prescribed crutches, braces, wraps or ice, use as directed. The acronym for this is PRICE. This means protection, rest, ice, compression, and elevation.  Nonsteroidal anti-inflammatory drugs (NSAIDs), can help relieve pain. But if taken immediately after an injury, they may actually increase swelling. Take NSAIDs with food in your stomach. Stop them if you develop stomach problems. Do not take these if you have a history of ulcers, stomach pain, or bleeding from the bowel. Do not take without your caregiver's approval if you have problems with fluid retention, heart failure, or kidney problems.  For ongoing knee problems, physical therapy may be helpful.  Glucosamine and chondroitin are over-the-counter dietary supplements. Both may help relieve the pain of osteoarthritis in the knee. These medicines are different from the usual anti-inflammatory drugs. Glucosamine may decrease the rate of cartilage destruction.  Injections of a corticosteroid drug into your knee joint may help reduce the symptoms of an arthritis flare-up. They may provide pain relief that lasts a few months. You may have to wait a few months between injections. The injections do have a small increased risk of infection, water retention, and elevated blood sugar levels.  Hyaluronic acid injected into damaged joints may ease pain and provide lubrication. These injections may work by reducing inflammation. A series of shots may give relief for as long as 6 months.  Topical painkillers. Applying certain ointments to your skin may help relieve the pain and stiffness of osteoarthritis. Ask your pharmacist for suggestions. Many over the-counter products are approved for temporary relief of arthritis pain.  In some countries, doctors often prescribe topical NSAIDs for relief of chronic conditions such as arthritis and tendinitis. A review of treatment with NSAID creams found that they worked as well as oral medications but  without the serious side effects. PREVENTION  Maintain a healthy weight. Extra pounds put more strain on your joints.  Get strong, stay limber. Weak muscles are a common cause of knee injuries. Stretching is important. Include flexibility exercises in your workouts.  Be smart about exercise. If you have osteoarthritis, chronic knee pain or recurring injuries, you may need to change the way you exercise. This does not mean you have to stop being active. If your knees ache after jogging or playing basketball, consider switching to swimming, water aerobics, or other low-impact activities, at least for a few days a week. Sometimes limiting high-impact activities will provide relief.  Make sure your shoes fit well. Choose footwear that is right for your sport.  Protect your knees. Use the proper gear for knee-sensitive activities. Use kneepads when playing volleyball or laying carpet. Buckle your seat belt every time you drive. Most shattered kneecaps occur in car accidents.  Rest when you are tired. SEEK MEDICAL CARE IF:  You have knee pain that is continual and does not seem to be getting better.  SEEK IMMEDIATE MEDICAL CARE IF:  Your knee joint feels hot to the touch and you have a high fever. MAKE SURE YOU:   Understand these instructions.  Will watch your condition.  Will get help right away if you are not doing well or get worse. Document Released: 03/30/2007 Document Revised: 08/25/2011 Document Reviewed: 03/30/2007 [facility] Patient Information 2015 [place], [place]. This information is not intended to replace advice given to you by your health care provider. Make sure you discuss any questions you have with your health care provider.  Musculoskeletal Pain Musculoskeletal pain is muscle and boney aches and pains. These pains can occur in any part of the body. Your caregiver may treat you without knowing the cause of the pain. They may treat you if blood or urine tests, X-rays, and other  tests were normal.  CAUSES There is often not a definite cause or reason for these pains. These pains may be caused by a type of germ (virus). The discomfort may also come from overuse. Overuse includes working out too hard when your body is not fit. Boney aches also come from weather changes. Bone is sensitive to atmospheric pressure changes. HOME CARE INSTRUCTIONS   Ask when your test results will be ready. Make sure you get your test results.  Only take over-the-counter or prescription medicines for pain, discomfort, or fever as directed by your caregiver. If you were given medications for your condition, do not drive, operate machinery or power tools, or sign legal documents for 24 hours. Do not drink alcohol. Do not take sleeping pills or other medications that may interfere with treatment.  Continue all activities unless the activities cause more pain. When the pain lessens, slowly resume normal activities. Gradually increase the intensity and duration of the activities or exercise.  During periods of severe pain, bed rest may be helpful. Lay or sit in any position that is comfortable.  Putting ice on the injured area.  Put ice in a bag.  Place a towel between your skin and the bag.  Leave the ice on for 15 to 20 minutes, 3 to 4 times a day.  Follow up with your caregiver for continued problems and no reason can be found for the pain. If the pain becomes worse or does not go away, it may be necessary to repeat tests or do additional testing. Your caregiver may need to look further for a possible cause. SEEK IMMEDIATE MEDICAL CARE IF:  You have pain that is getting worse and is not relieved by medications.  You develop chest pain that is associated with shortness or breath, sweating, feeling sick to your stomach (nauseous), or throw up (vomit).  Your pain becomes localized to the abdomen.  You develop any new symptoms that seem different or that concern you. MAKE SURE YOU:    Understand these instructions.  Will watch your condition.  Will get help right away if you are not doing well or get worse. Document Released: 06/02/2005 Document Revised: 08/25/2011 Document Reviewed: 02/04/2013 [facility] Patient Information 2015 [place], [place]. This information is not intended to replace advice given to you by your health care provider. Make sure you discuss any questions you have with your health care provider.

## 2015-03-11 NOTE — ED Notes (Signed)
Pt reports she has been having R knee trouble for a while. On Thursday she bumped her knee into a wall which has made knee pain worse. Pain radiates to ankle. Pain with weight bearing.

## 2015-03-22 ENCOUNTER — Ambulatory Visit: Payer: Self-pay

## 2015-04-02 ENCOUNTER — Emergency Department (HOSPITAL_COMMUNITY)
Admission: EM | Admit: 2015-04-02 | Discharge: 2015-04-02 | Disposition: A | Discharge status: Other Acute Inpatient Hospital | Payer: Self-pay | Attending: Emergency Medicine | Admitting: Emergency Medicine

## 2015-04-02 ENCOUNTER — Encounter (HOSPITAL_COMMUNITY): Payer: Self-pay | Admitting: *Deleted

## 2015-04-02 DIAGNOSIS — Z7982 Long term (current) use of aspirin: Secondary | ICD-10-CM | POA: Insufficient documentation

## 2015-04-02 DIAGNOSIS — Z791 Long term (current) use of non-steroidal anti-inflammatories (NSAID): Secondary | ICD-10-CM | POA: Insufficient documentation

## 2015-04-02 DIAGNOSIS — J45909 Unspecified asthma, uncomplicated: Secondary | ICD-10-CM | POA: Insufficient documentation

## 2015-04-02 DIAGNOSIS — F419 Anxiety disorder, unspecified: Secondary | ICD-10-CM | POA: Insufficient documentation

## 2015-04-02 DIAGNOSIS — I10 Essential (primary) hypertension: Secondary | ICD-10-CM | POA: Insufficient documentation

## 2015-04-02 DIAGNOSIS — Z880 Allergy status to penicillin: Secondary | ICD-10-CM | POA: Insufficient documentation

## 2015-04-02 DIAGNOSIS — R45850 Homicidal ideations: Secondary | ICD-10-CM

## 2015-04-02 DIAGNOSIS — Z720 Tobacco use: Secondary | ICD-10-CM | POA: Insufficient documentation

## 2015-04-02 DIAGNOSIS — M158 Other polyosteoarthritis: Secondary | ICD-10-CM | POA: Insufficient documentation

## 2015-04-02 DIAGNOSIS — Z79899 Other long term (current) drug therapy: Secondary | ICD-10-CM | POA: Insufficient documentation

## 2015-04-02 DIAGNOSIS — E7800 Pure hypercholesterolemia, unspecified: Secondary | ICD-10-CM | POA: Insufficient documentation

## 2015-04-02 DIAGNOSIS — Z8742 Personal history of other diseases of the female genital tract: Secondary | ICD-10-CM | POA: Insufficient documentation

## 2015-04-02 DIAGNOSIS — E119 Type 2 diabetes mellitus without complications: Secondary | ICD-10-CM | POA: Insufficient documentation

## 2015-04-02 DIAGNOSIS — M109 Gout, unspecified: Secondary | ICD-10-CM | POA: Insufficient documentation

## 2015-04-02 DIAGNOSIS — K219 Gastro-esophageal reflux disease without esophagitis: Secondary | ICD-10-CM | POA: Insufficient documentation

## 2015-04-02 DIAGNOSIS — R45851 Suicidal ideations: Secondary | ICD-10-CM

## 2015-04-02 DIAGNOSIS — Z794 Long term (current) use of insulin: Secondary | ICD-10-CM | POA: Insufficient documentation

## 2015-04-02 DIAGNOSIS — F251 Schizoaffective disorder, depressive type: Secondary | ICD-10-CM

## 2015-04-02 LAB — COMPREHENSIVE METABOLIC PANEL
ALK PHOS: 82 U/L (ref 38–126)
ALT: 12 U/L — ABNORMAL LOW (ref 14–54)
ANION GAP: 9 (ref 5–15)
AST: 21 U/L (ref 15–41)
Albumin: 4 g/dL (ref 3.5–5.0)
BILIRUBIN TOTAL: 0.3 mg/dL (ref 0.3–1.2)
BUN: 13 mg/dL (ref 6–20)
CALCIUM: 9.5 mg/dL (ref 8.9–10.3)
CO2: 26 mmol/L (ref 22–32)
Chloride: 107 mmol/L (ref 101–111)
Creatinine, Ser: 0.88 mg/dL (ref 0.44–1.00)
GFR calc non Af Amer: >60 mL/min (ref >60)
GLUCOSE: 108 mg/dL — AB (ref 65–99)
Potassium: 3.4 mmol/L — ABNORMAL LOW (ref 3.5–5.1)
Sodium: 142 mmol/L (ref 135–145)
TOTAL PROTEIN: 7.8 g/dL (ref 6.5–8.1)

## 2015-04-02 LAB — ACETAMINOPHEN LEVEL

## 2015-04-02 LAB — CBC
HEMATOCRIT: 35.3 % — AB (ref 36.0–46.0)
Hemoglobin: 11 g/dL — ABNORMAL LOW (ref 12.0–15.0)
MCH: 27.4 pg (ref 26.0–34.0)
MCHC: 31.2 g/dL (ref 30.0–36.0)
MCV: 88 fL (ref 78.0–100.0)
Platelets: 221 10*3/uL (ref 150–400)
RBC: 4.01 MIL/uL (ref 3.87–5.11)
RDW: 13 % (ref 11.5–15.5)
WBC: 7 10*3/uL (ref 4.0–10.5)

## 2015-04-02 LAB — RAPID URINE DRUG SCREEN, HOSP PERFORMED
Amphetamines: NOT DETECTED
BARBITURATES: NOT DETECTED
Benzodiazepines: NOT DETECTED
COCAINE: NOT DETECTED
Opiates: NOT DETECTED
Tetrahydrocannabinol: NOT DETECTED

## 2015-04-02 LAB — SALICYLATE LEVEL

## 2015-04-02 LAB — CBG MONITORING, ED
GLUCOSE-CAPILLARY: 90 mg/dL (ref 65–99)
Glucose-Capillary: 82 mg/dL (ref 65–99)

## 2015-04-02 LAB — ETHANOL: Alcohol, Ethyl (B): <5 mg/dL (ref <5)

## 2015-04-02 MED ORDER — PANTOPRAZOLE SODIUM 40 MG PO TBEC
40.0000 mg | DELAYED_RELEASE_TABLET | Freq: Every day | ORAL | Status: DC
Start: 2015-04-02 — End: 2015-04-02

## 2015-04-02 MED ORDER — ASPIRIN 81 MG PO CHEW
81.0000 mg | CHEWABLE_TABLET | Freq: Every day | ORAL | Status: DC
  Administered 2015-04-02: 81 mg via ORAL
  Filled 2015-04-02: qty 1

## 2015-04-02 MED ORDER — FUROSEMIDE 40 MG PO TABS: 40.0000 mg | ORAL_TABLET | Freq: Every day | ORAL | Status: DC

## 2015-04-02 MED ORDER — ASPIRIN 81 MG PO TABS: 81.0000 mg | ORAL_TABLET | Freq: Every day | ORAL | Status: DC

## 2015-04-02 MED ORDER — ACETAMINOPHEN 325 MG PO TABS: 650.0000 mg | ORAL_TABLET | ORAL | Status: DC | PRN

## 2015-04-02 MED ORDER — METFORMIN HCL 500 MG PO TABS
1000.0000 mg | ORAL_TABLET | Freq: Two times a day (BID) | ORAL | Status: DC
  Administered 2015-04-02: 1000 mg via ORAL
  Filled 2015-04-02 (×2): qty 2

## 2015-04-02 MED ORDER — IBUPROFEN 200 MG PO TABS: 600.0000 mg | ORAL_TABLET | Freq: Three times a day (TID) | ORAL | Status: DC | PRN

## 2015-04-02 MED ORDER — HYDROCHLOROTHIAZIDE 25 MG PO TABS
25.0000 mg | ORAL_TABLET | Freq: Every day | ORAL | Status: DC
  Administered 2015-04-02: 25 mg via ORAL
  Filled 2015-04-02: qty 1

## 2015-04-02 MED ORDER — POTASSIUM CHLORIDE CRYS ER 10 MEQ PO TBCR: 10.0000 meq | EXTENDED_RELEASE_TABLET | Freq: Every day | ORAL | Status: DC

## 2015-04-02 MED ORDER — ATENOLOL 100 MG PO TABS
100.0000 mg | ORAL_TABLET | Freq: Every day | ORAL | Status: DC
  Administered 2015-04-02: 100 mg via ORAL
  Filled 2015-04-02: qty 1

## 2015-04-02 MED ORDER — ALUM & MAG HYDROXIDE-SIMETH 200-200-20 MG/5ML PO SUSP: 30.0000 mL | ORAL | Status: DC | PRN

## 2015-04-02 MED ORDER — AMLODIPINE BESYLATE 5 MG PO TABS: 5.0000 mg | ORAL_TABLET | Freq: Every day | ORAL | Status: DC

## 2015-04-02 MED ORDER — ALBUTEROL SULFATE HFA 108 (90 BASE) MCG/ACT IN AERS: 2.0000 | INHALATION_SPRAY | Freq: Four times a day (QID) | RESPIRATORY_TRACT | Status: DC | PRN

## 2015-04-02 MED ORDER — LORATADINE 10 MG PO TABS: 10.0000 mg | ORAL_TABLET | Freq: Every day | ORAL | Status: DC

## 2015-04-02 MED ORDER — INSULIN GLARGINE 100 UNIT/ML ~~LOC~~ SOLN
30.0000 [IU] | Freq: Every day | SUBCUTANEOUS | Status: DC
  Filled 2015-04-02: qty 0.3

## 2015-04-02 MED ORDER — ATORVASTATIN CALCIUM 40 MG PO TABS
40.0000 mg | ORAL_TABLET | Freq: Every evening | ORAL | Status: DC
  Administered 2015-04-02: 40 mg via ORAL
  Filled 2015-04-02: qty 1

## 2015-04-02 MED ORDER — FOLIC ACID 1 MG PO TABS
1.0000 mg | ORAL_TABLET | Freq: Every day | ORAL | Status: DC
Start: 2015-04-02 — End: 2015-04-03
  Administered 2015-04-02: 1 mg via ORAL
  Filled 2015-04-02: qty 1

## 2015-04-02 MED ORDER — PANTOPRAZOLE SODIUM 40 MG PO TBEC
40.0000 mg | DELAYED_RELEASE_TABLET | Freq: Every day | ORAL | Status: DC
  Administered 2015-04-02: 40 mg via ORAL
  Filled 2015-04-02: qty 1

## 2015-04-02 MED ORDER — METOCLOPRAMIDE HCL 10 MG PO TABS
10.0000 mg | ORAL_TABLET | Freq: Three times a day (TID) | ORAL | Status: DC
  Administered 2015-04-02 (×2): 10 mg via ORAL
  Filled 2015-04-02 (×2): qty 1

## 2015-04-02 MED ORDER — OXCARBAZEPINE 300 MG PO TABS: 600.0000 mg | ORAL_TABLET | Freq: Every day | ORAL | Status: DC

## 2015-04-02 MED ORDER — HYDROXYZINE PAMOATE 25 MG PO CAPS
25.0000 mg | ORAL_CAPSULE | Freq: Four times a day (QID) | ORAL | Status: DC | PRN
Start: 2015-04-02 — End: 2015-04-02
  Filled 2015-04-02: qty 1

## 2015-04-02 MED ORDER — VITAMIN B-1 100 MG PO TABS
100.0000 mg | ORAL_TABLET | Freq: Every day | ORAL | Status: DC
  Administered 2015-04-02: 100 mg via ORAL
  Filled 2015-04-02: qty 1

## 2015-04-02 MED ORDER — INDOMETHACIN 25 MG PO CAPS
25.0000 mg | ORAL_CAPSULE | Freq: Three times a day (TID) | ORAL | Status: DC | PRN
  Filled 2015-04-02: qty 1

## 2015-04-02 MED ORDER — ZOLPIDEM TARTRATE 5 MG PO TABS: 5.0000 mg | ORAL_TABLET | Freq: Every evening | ORAL | Status: DC | PRN

## 2015-04-02 MED ORDER — CELECOXIB 100 MG PO CAPS
100.0000 mg | ORAL_CAPSULE | Freq: Two times a day (BID) | ORAL | Status: DC
  Administered 2015-04-02: 100 mg via ORAL
  Filled 2015-04-02: qty 1

## 2015-04-02 MED ORDER — OYSTER SHELL CALCIUM 500 MG PO TABS: 500.0000 mg | ORAL_TABLET | Freq: Every day | ORAL | Status: DC

## 2015-04-02 MED ORDER — CALCIUM CARBONATE 1250 (500 CA) MG PO TABS
1.0000 | ORAL_TABLET | Freq: Every day | ORAL | Status: DC
  Administered 2015-04-02: 500 mg via ORAL
  Filled 2015-04-02: qty 1

## 2015-04-02 MED ORDER — ONDANSETRON HCL 4 MG PO TABS: 4.0000 mg | ORAL_TABLET | Freq: Three times a day (TID) | ORAL | Status: DC | PRN

## 2015-04-02 MED ORDER — ADULT MULTIVITAMIN W/MINERALS CH
1.0000 | ORAL_TABLET | Freq: Every day | ORAL | Status: DC
  Administered 2015-04-02: 1 via ORAL
  Filled 2015-04-02: qty 1

## 2015-04-02 MED ORDER — HYDROXYZINE HCL 25 MG PO TABS: 25.0000 mg | ORAL_TABLET | Freq: Four times a day (QID) | ORAL | Status: DC | PRN

## 2015-04-02 MED ORDER — NALTREXONE HCL 50 MG PO TABS
50.0000 mg | ORAL_TABLET | Freq: Every day | ORAL | Status: DC
  Administered 2015-04-02: 50 mg via ORAL
  Filled 2015-04-02: qty 1

## 2015-04-02 MED ORDER — POTASSIUM CHLORIDE CRYS ER 20 MEQ PO TBCR
20.0000 meq | EXTENDED_RELEASE_TABLET | Freq: Every day | ORAL | Status: DC
  Administered 2015-04-02: 20 meq via ORAL
  Filled 2015-04-02: qty 1

## 2015-04-02 MED ORDER — QUETIAPINE FUMARATE ER 400 MG PO TB24
400.0000 mg | ORAL_TABLET | Freq: Every day | ORAL | Status: DC
  Administered 2015-04-02: 400 mg via ORAL
  Filled 2015-04-02: qty 1

## 2015-04-02 MED ORDER — LINAGLIPTIN 5 MG PO TABS
5.0000 mg | ORAL_TABLET | Freq: Every day | ORAL | Status: DC
  Administered 2015-04-02: 5 mg via ORAL
  Filled 2015-04-02: qty 1

## 2015-04-02 MED ORDER — BACLOFEN 10 MG PO TABS
10.0000 mg | ORAL_TABLET | Freq: Three times a day (TID) | ORAL | Status: DC
  Administered 2015-04-02 (×2): 10 mg via ORAL
  Filled 2015-04-02 (×2): qty 1

## 2015-04-02 MED ORDER — LORAZEPAM 1 MG PO TABS: 1.0000 mg | ORAL_TABLET | Freq: Three times a day (TID) | ORAL | Status: DC | PRN

## 2015-04-02 MED ORDER — NICOTINE 21 MG/24HR TD PT24: 21.0000 mg | MEDICATED_PATCH | Freq: Every day | TRANSDERMAL | Status: DC

## 2015-04-02 MED ORDER — GABAPENTIN 400 MG PO CAPS
400.0000 mg | ORAL_CAPSULE | Freq: Two times a day (BID) | ORAL | Status: DC
Start: 2015-04-02 — End: 2015-04-03
  Administered 2015-04-02 (×2): 400 mg via ORAL
  Filled 2015-04-02 (×2): qty 1

## 2015-04-02 NOTE — BH Assessment (Signed)
[place] Assessment Progress Note  Per Waylan Boga, NP, this pt requires psychiatric hospitalization at this time.  Maureen Crape, RN, [facility] has assigned pt to [facility] Rm 506-1.  Pt has signed Voluntary Admission and Consent for Treatment, as well as Consent to Release Information to [place], her outpatient provider, and a notification call has been placed.  Signed forms have been faxed to [facility].  Pt's nurse, Maureen Swanson, has been notified, and agrees to send original paperwork along with pt via [facility], and to call report to [phone].  Maureen Swanson, [place] Triage Specialist [phone]

## 2015-04-02 NOTE — ED Notes (Signed)
Patient was scheduled to go to [facility] today.  Report was called and [facility] arrived to transport the patient.  It was discovered that the patient is involuntary and she was brought back to the [facility] without incident.  I explained to her what happened and she verbalized understanding.  Patient came in with IVC paperwork and no first opinion.  Jalene Mullet contacted the EDP to get the paperwork done correctly.  Patient will be transferred after this is done.  She was cooperative throughout the event and has offered no behavior problems on the unit.

## 2015-04-02 NOTE — ED Notes (Signed)
Has been pleasant and cooperative on the unit.  Awaiting transfer to [facility].
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
## 2015-04-02 NOTE — BH Assessment (Signed)
Writer informed [facility] [place] of the consut.

## 2015-04-02 NOTE — ED Provider Notes (Signed)
CSN: [id number]     Arrival date & time 04/02/15  1213 History  By signing my name below, I, Maureen Swanson, attest that this documentation has been prepared under the direction and in the presence of Delos Haring, PA-C.  Electronically Signed: Tula Swanson, ED Scribe. 04/02/2015. 1:36 PM.  No chief complaint on file.  The history is provided by the patient. No language interpreter was used.    HPI Comments: Maureen Swanson is a 49 y.o. female with a history of depression, anxiety, substance abuse and bipolar disorder, brought in by [facility] who presents to the Emergency Department complaining of intermittent suicidal and homicidal thoughts that started 2 weeks ago. She does not know why she feels this way. Pt reports that she tried to cut her wrists with a razor 3 days ago because she was feeling helpless. She also states auditory hallucinations that tell her to jump in front of cars. Pt currently takes multiple medications, but notes noncompliance in the last few days. She has a history of SI/HI with no known triggers. Pt rarely drinks EtOH. She denies drug abuse and tobacco use.   Past Medical History  Diagnosis Date  . High cholesterol   . Depression   . Gout   . Anxiety   . Bipolar 1 disorder ([place])   . GERD (gastroesophageal reflux disease)   . Substance abuse     crack cocaine  . Headache(784.0)     migraines  . TMJ (temporomandibular joint disorder)   . Asthma     daily and prn inhalers  . Arthritis     back and knees  . Gastric ulcer   . 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 Hypertension     under control, has been on med. x 12 yrs.  . Diabetes mellitus     diet-controlled   Past Surgical History  Procedure Laterality Date  . Knee arthroscopy w/ partial medial meniscectomy  05/01/2010    right  . Breast lumpectomy with needle localization  04/19/2012    Procedure: BREAST LUMPECTOMY WITH NEEDLE LOCALIZATION;  Surgeon: [name], MD;  Location:  [facility];  Service: General;  Laterality: Right;   Family History  Problem Relation Age of Onset  . Diabetes Mother   . Breast cancer Mother   . Cancer Father   . Bipolar disorder Maternal Aunt    Social History  Substance Use Topics  . Smoking status: Current Every Day Smoker -- 0.50 packs/day for 15 years    Types: Cigarettes    Last Attempt to Quit: 08/02/2012  . Smokeless tobacco: Never Used     Comment: quit smoking 02/2012  . Alcohol Use: 0.0 oz/week     Comment: reports she drinks 3 40 ounce beers daily   OB History    Gravida Para Term Preterm AB TAB SAB Ectopic Multiple Living   3 2 2  1  1   2      Review of Systems  Skin: Positive for wound.  Psychiatric/Behavioral: Positive for suicidal ideas and self-injury.   Allergies  Chocolate; Orange; Penicillins; and Tomato  Home Medications   Prior to Admission medications   Medication Sig Start Date End Date Taking? Authorizing Provider  albuterol (PROVENTIL HFA;VENTOLIN HFA) 108 (90 BASE) MCG/ACT inhaler Inhale 2 puffs into the lungs every 6 (six) hours as needed for wheezing or shortness of breath. 03/02/14  Yes Encarnacion Slates, NP  amLODipine (NORVASC) 5 MG  tablet Take 5 mg by mouth daily.   Yes Historical Provider, MD  aspirin 81 MG tablet Take 81 mg by mouth daily.   Yes Historical Provider, MD  atenolol (TENORMIN) 100 MG tablet Take 100 mg by mouth daily.   Yes Historical Provider, MD  atorvastatin (LIPITOR) 40 MG tablet Take 40 mg by mouth every evening.    Yes Historical Provider, MD  celecoxib (CELEBREX) 100 MG capsule Take 100 mg by mouth 2 (two) times daily.   Yes Historical Provider, MD  cetirizine (ZYRTEC) 10 MG tablet Take 10 mg by mouth daily as needed for allergies.    Yes Historical Provider, MD  Dexlansoprazole 30 MG capsule Take 30 mg by mouth daily.   Yes Historical Provider, MD  folic acid (FOLVITE) 1 MG tablet Take 1 tablet (1 mg total) by mouth daily. 12/11/14  Yes Carly Montey Hora, MD   furosemide (LASIX) 40 MG tablet Take 40 mg by mouth daily.   Yes Historical Provider, MD  gabapentin (NEURONTIN) 400 MG capsule Take 400 mg by mouth 2 (two) times daily.   Yes Historical Provider, MD  hydrochlorothiazide (HYDRODIURIL) 25 MG tablet Take 25 mg by mouth daily.   Yes Historical Provider, MD  hydrOXYzine (VISTARIL) 25 MG capsule Take 25 mg by mouth every 6 (six) hours as needed for anxiety.   Yes Historical Provider, MD  indomethacin (INDOCIN) 25 MG capsule Take 25 mg by mouth 3 (three) times daily as needed for mild pain.   Yes Historical Provider, MD  insulin glargine (LANTUS) 100 UNIT/ML injection Inject 30 Units into the skin at bedtime.    Yes Historical Provider, MD  metFORMIN (GLUCOPHAGE) 1000 MG tablet Take 1,000 mg by mouth 2 (two) times daily with a meal.   Yes Historical Provider, MD  metoCLOPramide (REGLAN) 10 MG tablet Take 10 mg by mouth 4 (four) times daily -  before meals and at bedtime.   Yes Historical Provider, MD  Multiple Vitamin (MULTIVITAMIN WITH MINERALS) TABS tablet Take 1 tablet by mouth daily. 12/11/14  Yes Carly Montey Hora, MD  naltrexone (DEPADE) 50 MG tablet Take 50 mg by mouth daily.   Yes Historical Provider, MD  omeprazole (PRILOSEC) 20 MG capsule Take 1 capsule (20 mg total) by mouth daily. For GERD 08/02/14  Yes Shuvon B Rankin, NP  Oxcarbazepine (TRILEPTAL) 300 MG tablet Take 600 mg by mouth daily.   Yes Historical Provider, MD  [place] Calcium (OYSCO 500) 500 MG TABS Take 500 mg by mouth daily.    Yes Historical Provider, MD  potassium chloride SA (K-DUR,KLOR-CON) 20 MEQ tablet Take 1 tablet (20 mEq total) by mouth daily. 11/23/14  Yes Wandra Arthurs, MD  QUEtiapine (SEROQUEL XR) 400 MG 24 hr tablet Take 1 tablet (400 mg total) by mouth daily at 8 pm. 12/11/14  Yes Carly J Rivet, MD  sitaGLIPtin (JANUVIA) 50 MG tablet Take 50 mg by mouth daily.   Yes Historical Provider, MD  thiamine 100 MG tablet Take 1 tablet (100 mg total) by mouth daily. 12/11/14  Yes  Carly Montey Hora, MD  traMADol (ULTRAM) 50 MG tablet Take 1 tablet (50 mg total) by mouth every 6 (six) hours as needed. Patient taking differently: Take 50 mg by mouth every 6 (six) hours as needed for moderate pain.  03/11/15  Yes Robyn M Hess, PA-C  baclofen (LIORESAL) 10 MG tablet Take 10 mg by mouth 3 (three) times daily.    Historical Provider, MD  naproxen (NAPROSYN) 500  MG tablet Take 1 tablet (500 mg total) by mouth 2 (two) times daily. 03/11/15   Robyn M Hess, PA-C   BP 142/86 mmHg  Pulse 74  Temp(Src) 98.4 F (36.9 C) (Oral)  Resp 18  SpO2 99%  LMP  (Approximate) Physical Exam  Constitutional: She appears well-developed and well-nourished. No distress.  HENT:  Head: Normocephalic and atraumatic.  Eyes: Conjunctivae and EOM are normal.  Neck: Neck supple. No tracheal deviation present.  Cardiovascular: Normal rate.   Pulmonary/Chest: Effort normal. No respiratory distress.  Skin: Skin is warm and dry.  Very superficial and old appearing scratches to right distal anterior wrist. approx 0.5 cm in length  Psychiatric: Her mood appears anxious. She is actively hallucinating. She exhibits a depressed mood. She expresses homicidal and suicidal ideation. She expresses suicidal plans. She expresses no homicidal plans.  Nursing note and vitals reviewed.   ED Course  Procedures   DIAGNOSTIC STUDIES: Oxygen Saturation is 96% on RA, normal by my interpretation.    COORDINATION OF CARE: 1:29 PM Discussed treatment plan with pt which includes lab work and consult with [facility]. Pt agreed to plan.   Labs Review Labs Reviewed  COMPREHENSIVE METABOLIC PANEL - Abnormal; Notable for the following:    Potassium 3.4 (*)    Glucose, Bld 108 (*)    ALT 12 (*)    All other components within normal limits  ACETAMINOPHEN LEVEL - Abnormal; Notable for the following:    Acetaminophen (Tylenol), Serum <10 (*)    All other components within normal limits  CBC - Abnormal; Notable for  the following:    Hemoglobin 11.0 (*)    HCT 35.3 (*)    All other components within normal limits  ETHANOL  SALICYLATE LEVEL  URINE RAPID DRUG SCREEN, HOSP PERFORMED  CBG MONITORING, ED  CBG MONITORING, ED    Imaging Review No results found. I have personally reviewed and evaluated these lab results as part of my medical decision-making.   EKG Interpretation None      MDM   Final diagnoses:  Schizoaffective disorder, depressive type ([place])  Homicidal ideation  Suicidal ideation    TTS consult ordered Labs pending, pt medically cleared  Filed Vitals:   04/02/15 2015  BP: 142/86  Pulse: 74  Temp: 98.4 F (36.9 C)  Resp: 18    I personally performed the services described in this documentation, which was scribed in my presence. The recorded information has been reviewed and is accurate.    Delos Haring, PA-C 04/11/15 [place], MD 04/11/15 1500

## 2015-04-02 NOTE — ED Notes (Signed)
Patient alert and in room resting on stretcher with eyes opened. Patient expressed she is here because " she had thoughts of harming herself and her son." Patient states she is hearing voices to harm herself; however she is able to contract for safety. Patient states she is not satisfied with living with her son and doesn't know what she is going to do. Patient allowed to express her feelings and thoughts. Patient reassured she would work out some type of arrangement that she will be comfortable with before being discharged. Patient is passive SI and HI. Q 15 minute checks maintained and in progress and monitoring continues.

## 2015-04-02 NOTE — BHH Counselor (Signed)
04/02/2015  Patient meets criteria for a inpatient admission, per Waylan Boga, DNP. The attending provider at [facility] is Dr. Posey Boyer. Room assignment is 506-1. Support paperwork completed. Nursing report [phone]. Patient awaiting [facility] transport.

## 2015-04-02 NOTE — ED Notes (Signed)
Pt belongings taken out of rm. Pt has two bag zip lock bag with pt pills, one silver ring, CBG pack, black phone case with black flip cell phone, [place] id, [place] id card,21 bus pass SS card, Insurance card, black slide, brown bra, gray sweat pants, blk/white sweat shorts, brown hat, one gray and one brown shirt, green socks
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
## 2015-04-03 ENCOUNTER — Inpatient Hospital Stay (HOSPITAL_COMMUNITY)
Admission: AD | Admit: 2015-04-03 | Discharge: 2015-04-11 | DRG: 885 | Disposition: A | Discharge status: Home / Self Care | Payer: Federal, State, Local not specified - Other | Source: Intra-hospital | Attending: Psychiatry | Admitting: Psychiatry

## 2015-04-03 ENCOUNTER — Encounter (HOSPITAL_COMMUNITY): Payer: Self-pay | Admitting: *Deleted

## 2015-04-03 DIAGNOSIS — R45851 Suicidal ideations: Secondary | ICD-10-CM | POA: Diagnosis present

## 2015-04-03 DIAGNOSIS — Z7984 Long term (current) use of oral hypoglycemic drugs: Secondary | ICD-10-CM

## 2015-04-03 DIAGNOSIS — I10 Essential (primary) hypertension: Secondary | ICD-10-CM | POA: Diagnosis present

## 2015-04-03 DIAGNOSIS — F250 Schizoaffective disorder, bipolar type: Principal | ICD-10-CM | POA: Diagnosis present

## 2015-04-03 DIAGNOSIS — Z8673 Personal history of transient ischemic attack (TIA), and cerebral infarction without residual deficits: Secondary | ICD-10-CM

## 2015-04-03 DIAGNOSIS — E785 Hyperlipidemia, unspecified: Secondary | ICD-10-CM | POA: Diagnosis present

## 2015-04-03 DIAGNOSIS — F1020 Alcohol dependence, uncomplicated: Secondary | ICD-10-CM | POA: Diagnosis not present

## 2015-04-03 DIAGNOSIS — F10239 Alcohol dependence with withdrawal, unspecified: Secondary | ICD-10-CM | POA: Diagnosis present

## 2015-04-03 DIAGNOSIS — E7800 Pure hypercholesterolemia, unspecified: Secondary | ICD-10-CM | POA: Diagnosis present

## 2015-04-03 DIAGNOSIS — R45850 Homicidal ideations: Secondary | ICD-10-CM | POA: Diagnosis not present

## 2015-04-03 DIAGNOSIS — F17210 Nicotine dependence, cigarettes, uncomplicated: Secondary | ICD-10-CM | POA: Diagnosis present

## 2015-04-03 DIAGNOSIS — F1490 Cocaine use, unspecified, uncomplicated: Secondary | ICD-10-CM | POA: Diagnosis not present

## 2015-04-03 DIAGNOSIS — Z7982 Long term (current) use of aspirin: Secondary | ICD-10-CM | POA: Diagnosis not present

## 2015-04-03 DIAGNOSIS — Z803 Family history of malignant neoplasm of breast: Secondary | ICD-10-CM

## 2015-04-03 DIAGNOSIS — F1420 Cocaine dependence, uncomplicated: Secondary | ICD-10-CM | POA: Diagnosis present

## 2015-04-03 DIAGNOSIS — Z23 Encounter for immunization: Secondary | ICD-10-CM | POA: Diagnosis not present

## 2015-04-03 DIAGNOSIS — Z9114 Patient's other noncompliance with medication regimen: Secondary | ICD-10-CM

## 2015-04-03 DIAGNOSIS — N390 Urinary tract infection, site not specified: Secondary | ICD-10-CM | POA: Diagnosis present

## 2015-04-03 DIAGNOSIS — E119 Type 2 diabetes mellitus without complications: Secondary | ICD-10-CM | POA: Diagnosis present

## 2015-04-03 DIAGNOSIS — Z833 Family history of diabetes mellitus: Secondary | ICD-10-CM

## 2015-04-03 DIAGNOSIS — Z794 Long term (current) use of insulin: Secondary | ICD-10-CM | POA: Diagnosis not present

## 2015-04-03 DIAGNOSIS — M1990 Unspecified osteoarthritis, unspecified site: Secondary | ICD-10-CM | POA: Diagnosis present

## 2015-04-03 DIAGNOSIS — Z79899 Other long term (current) drug therapy: Secondary | ICD-10-CM | POA: Diagnosis not present

## 2015-04-03 DIAGNOSIS — F410 Panic disorder [episodic paroxysmal anxiety] without agoraphobia: Secondary | ICD-10-CM | POA: Diagnosis present

## 2015-04-03 DIAGNOSIS — G4700 Insomnia, unspecified: Secondary | ICD-10-CM | POA: Diagnosis present

## 2015-04-03 LAB — GLUCOSE, CAPILLARY
GLUCOSE-CAPILLARY: 134 mg/dL — AB (ref 65–99)
GLUCOSE-CAPILLARY: 154 mg/dL — AB (ref 65–99)
GLUCOSE-CAPILLARY: 105 mg/dL — AB (ref 65–99)
Glucose-Capillary: 92 mg/dL (ref 65–99)
Glucose-Capillary: 154 mg/dL — ABNORMAL HIGH (ref 65–99)

## 2015-04-03 MED ORDER — ONDANSETRON HCL 4 MG PO TABS: 4.0000 mg | ORAL_TABLET | Freq: Three times a day (TID) | ORAL | Status: DC | PRN

## 2015-04-03 MED ORDER — BENZTROPINE MESYLATE 1 MG PO TABS: 1.0000 mg | ORAL_TABLET | Freq: Four times a day (QID) | ORAL | Status: DC | PRN

## 2015-04-03 MED ORDER — ALUM & MAG HYDROXIDE-SIMETH 200-200-20 MG/5ML PO SUSP: 30.0000 mL | ORAL | Status: DC | PRN

## 2015-04-03 MED ORDER — ADULT MULTIVITAMIN W/MINERALS CH
1.0000 | ORAL_TABLET | Freq: Every day | ORAL | Status: DC
  Administered 2015-04-03 – 2015-04-11 (×9): 1 via ORAL
  Filled 2015-04-03 (×13): qty 1

## 2015-04-03 MED ORDER — HALOPERIDOL 5 MG PO TABS: 5.0000 mg | ORAL_TABLET | Freq: Four times a day (QID) | ORAL | Status: DC | PRN

## 2015-04-03 MED ORDER — METOCLOPRAMIDE HCL 10 MG PO TABS
10.0000 mg | ORAL_TABLET | Freq: Three times a day (TID) | ORAL | Status: DC
  Administered 2015-04-03 – 2015-04-11 (×34): 10 mg via ORAL
  Filled 2015-04-03 (×2): qty 2
  Filled 2015-04-03 (×8): qty 1
  Filled 2015-04-03: qty 2
  Filled 2015-04-03 (×28): qty 1
  Filled 2015-04-03: qty 2
  Filled 2015-04-03 (×5): qty 1

## 2015-04-03 MED ORDER — INFLUENZA VAC SPLIT QUAD 0.5 ML IM SUSY
0.5000 mL | PREFILLED_SYRINGE | INTRAMUSCULAR | Status: AC
  Administered 2015-04-04: 0.5 mL via INTRAMUSCULAR
  Filled 2015-04-03: qty 0.5

## 2015-04-03 MED ORDER — POTASSIUM CHLORIDE CRYS ER 20 MEQ PO TBCR
20.0000 meq | EXTENDED_RELEASE_TABLET | Freq: Every day | ORAL | Status: DC
  Administered 2015-04-03 – 2015-04-11 (×9): 20 meq via ORAL
  Filled 2015-04-03 (×13): qty 1

## 2015-04-03 MED ORDER — LORATADINE 10 MG PO TABS
10.0000 mg | ORAL_TABLET | Freq: Every day | ORAL | Status: DC
  Administered 2015-04-03 – 2015-04-11 (×9): 10 mg via ORAL
  Filled 2015-04-03 (×13): qty 1

## 2015-04-03 MED ORDER — GABAPENTIN 400 MG PO CAPS
400.0000 mg | ORAL_CAPSULE | Freq: Two times a day (BID) | ORAL | Status: DC
  Administered 2015-04-03 – 2015-04-11 (×17): 400 mg via ORAL
  Filled 2015-04-03 (×7): qty 1
  Filled 2015-04-03: qty 14
  Filled 2015-04-03 (×5): qty 1
  Filled 2015-04-03: qty 14
  Filled 2015-04-03 (×10): qty 1

## 2015-04-03 MED ORDER — BACLOFEN 10 MG PO TABS
10.0000 mg | ORAL_TABLET | Freq: Three times a day (TID) | ORAL | Status: DC
  Administered 2015-04-03 – 2015-04-11 (×25): 10 mg via ORAL
  Filled 2015-04-03 (×34): qty 1

## 2015-04-03 MED ORDER — ENSURE ENLIVE PO LIQD
237.0000 mL | Freq: Two times a day (BID) | ORAL | Status: DC
  Administered 2015-04-04 – 2015-04-06 (×4): 237 mL via ORAL

## 2015-04-03 MED ORDER — INSULIN ASPART 100 UNIT/ML ~~LOC~~ SOLN
0.0000 [IU] | Freq: Three times a day (TID) | SUBCUTANEOUS | Status: DC
  Administered 2015-04-03 (×2): 3 [IU] via SUBCUTANEOUS
  Administered 2015-04-04: 2 [IU] via SUBCUTANEOUS
  Administered 2015-04-04: 3 [IU] via SUBCUTANEOUS
  Administered 2015-04-05 (×3): 2 [IU] via SUBCUTANEOUS
  Administered 2015-04-06 – 2015-04-07 (×5): 3 [IU] via SUBCUTANEOUS
  Administered 2015-04-07: 5 [IU] via SUBCUTANEOUS
  Administered 2015-04-08 (×2): 3 [IU] via SUBCUTANEOUS
  Administered 2015-04-09: 2 [IU] via SUBCUTANEOUS
  Administered 2015-04-09 – 2015-04-10 (×2): 3 [IU] via SUBCUTANEOUS
  Administered 2015-04-11 (×2): 2 [IU] via SUBCUTANEOUS

## 2015-04-03 MED ORDER — IBUPROFEN 600 MG PO TABS
600.0000 mg | ORAL_TABLET | Freq: Three times a day (TID) | ORAL | Status: DC | PRN
Start: 2015-04-03 — End: 2015-04-11
  Administered 2015-04-04 – 2015-04-10 (×2): 600 mg via ORAL
  Filled 2015-04-03 (×2): qty 1

## 2015-04-03 MED ORDER — LORAZEPAM 1 MG PO TABS
1.0000 mg | ORAL_TABLET | Freq: Three times a day (TID) | ORAL | Status: DC | PRN
  Administered 2015-04-03: 1 mg via ORAL
  Filled 2015-04-03: qty 1

## 2015-04-03 MED ORDER — ATENOLOL 100 MG PO TABS
100.0000 mg | ORAL_TABLET | Freq: Every day | ORAL | Status: DC
  Administered 2015-04-03 – 2015-04-11 (×9): 100 mg via ORAL
  Filled 2015-04-03: qty 4
  Filled 2015-04-03 (×8): qty 1
  Filled 2015-04-03: qty 2
  Filled 2015-04-03: qty 1
  Filled 2015-04-03: qty 2
  Filled 2015-04-03 (×2): qty 1

## 2015-04-03 MED ORDER — LINAGLIPTIN 5 MG PO TABS
5.0000 mg | ORAL_TABLET | Freq: Every day | ORAL | Status: DC
  Administered 2015-04-03 – 2015-04-11 (×9): 5 mg via ORAL
  Filled 2015-04-03 (×12): qty 1

## 2015-04-03 MED ORDER — AMLODIPINE BESYLATE 5 MG PO TABS
5.0000 mg | ORAL_TABLET | Freq: Every day | ORAL | Status: DC
  Administered 2015-04-03 – 2015-04-11 (×9): 5 mg via ORAL
  Filled 2015-04-03 (×14): qty 1

## 2015-04-03 MED ORDER — CALCIUM CARBONATE 1250 (500 CA) MG PO TABS
1.0000 | ORAL_TABLET | Freq: Every day | ORAL | Status: DC
  Administered 2015-04-03 – 2015-04-11 (×9): 500 mg via ORAL
  Filled 2015-04-03 (×12): qty 1

## 2015-04-03 MED ORDER — NALTREXONE HCL 50 MG PO TABS
50.0000 mg | ORAL_TABLET | Freq: Every day | ORAL | Status: DC
  Administered 2015-04-03 – 2015-04-11 (×9): 50 mg via ORAL
  Filled 2015-04-03 (×5): qty 1
  Filled 2015-04-03: qty 7
  Filled 2015-04-03 (×6): qty 1

## 2015-04-03 MED ORDER — OXCARBAZEPINE 300 MG PO TABS
600.0000 mg | ORAL_TABLET | Freq: Every day | ORAL | Status: DC
  Administered 2015-04-03 – 2015-04-11 (×9): 600 mg via ORAL
  Filled 2015-04-03: qty 14
  Filled 2015-04-03 (×11): qty 2

## 2015-04-03 MED ORDER — MAGNESIUM HYDROXIDE 400 MG/5ML PO SUSP: 30.0000 mL | Freq: Every day | ORAL | Status: DC | PRN

## 2015-04-03 MED ORDER — NICOTINE 21 MG/24HR TD PT24
21.0000 mg | MEDICATED_PATCH | Freq: Every day | TRANSDERMAL | Status: DC
  Administered 2015-04-03 – 2015-04-11 (×9): 21 mg via TRANSDERMAL
  Filled 2015-04-03 (×12): qty 1

## 2015-04-03 MED ORDER — FOLIC ACID 1 MG PO TABS
1.0000 mg | ORAL_TABLET | Freq: Every day | ORAL | Status: DC
  Administered 2015-04-03 – 2015-04-11 (×9): 1 mg via ORAL
  Filled 2015-04-03 (×13): qty 1

## 2015-04-03 MED ORDER — METFORMIN HCL 500 MG PO TABS
1000.0000 mg | ORAL_TABLET | Freq: Two times a day (BID) | ORAL | Status: DC
  Administered 2015-04-03 – 2015-04-11 (×17): 1000 mg via ORAL
  Filled 2015-04-03 (×23): qty 2

## 2015-04-03 MED ORDER — LAMOTRIGINE 25 MG PO TABS
25.0000 mg | ORAL_TABLET | Freq: Every evening | ORAL | Status: DC
  Administered 2015-04-03 – 2015-04-06 (×4): 25 mg via ORAL
  Filled 2015-04-03 (×7): qty 1

## 2015-04-03 MED ORDER — CELECOXIB 100 MG PO CAPS
100.0000 mg | ORAL_CAPSULE | Freq: Two times a day (BID) | ORAL | Status: DC
  Administered 2015-04-03 – 2015-04-11 (×17): 100 mg via ORAL
  Filled 2015-04-03 (×23): qty 1

## 2015-04-03 MED ORDER — QUETIAPINE FUMARATE ER 400 MG PO TB24
400.0000 mg | ORAL_TABLET | Freq: Every day | ORAL | Status: DC
  Filled 2015-04-03: qty 1

## 2015-04-03 MED ORDER — ASPIRIN 81 MG PO CHEW
81.0000 mg | CHEWABLE_TABLET | Freq: Every day | ORAL | Status: DC
  Administered 2015-04-03 – 2015-04-11 (×9): 81 mg via ORAL
  Filled 2015-04-03 (×14): qty 1

## 2015-04-03 MED ORDER — HYDROXYZINE HCL 25 MG PO TABS
25.0000 mg | ORAL_TABLET | Freq: Four times a day (QID) | ORAL | Status: DC | PRN
  Administered 2015-04-03 – 2015-04-08 (×8): 25 mg via ORAL
  Filled 2015-04-03: qty 1
  Filled 2015-04-03: qty 10
  Filled 2015-04-03 (×7): qty 1

## 2015-04-03 MED ORDER — HYDROCHLOROTHIAZIDE 25 MG PO TABS
25.0000 mg | ORAL_TABLET | Freq: Every day | ORAL | Status: DC
  Administered 2015-04-03 – 2015-04-11 (×9): 25 mg via ORAL
  Filled 2015-04-03 (×13): qty 1

## 2015-04-03 MED ORDER — ACETAMINOPHEN 325 MG PO TABS: 650.0000 mg | ORAL_TABLET | ORAL | Status: DC | PRN

## 2015-04-03 MED ORDER — INDOMETHACIN 25 MG PO CAPS: 25.0000 mg | ORAL_CAPSULE | Freq: Three times a day (TID) | ORAL | Status: DC | PRN

## 2015-04-03 MED ORDER — QUETIAPINE FUMARATE ER 50 MG PO TB24
450.0000 mg | ORAL_TABLET | Freq: Every day | ORAL | Status: DC
  Administered 2015-04-03: 450 mg via ORAL
  Filled 2015-04-03 (×2): qty 1

## 2015-04-03 MED ORDER — ACETAMINOPHEN 325 MG PO TABS
650.0000 mg | ORAL_TABLET | Freq: Four times a day (QID) | ORAL | Status: DC | PRN
  Administered 2015-04-07 – 2015-04-10 (×6): 650 mg via ORAL
  Filled 2015-04-03 (×6): qty 2

## 2015-04-03 MED ORDER — PANTOPRAZOLE SODIUM 40 MG PO TBEC
40.0000 mg | DELAYED_RELEASE_TABLET | Freq: Every day | ORAL | Status: DC
  Administered 2015-04-03 – 2015-04-11 (×9): 40 mg via ORAL
  Filled 2015-04-03 (×13): qty 1

## 2015-04-03 MED ORDER — FUROSEMIDE 40 MG PO TABS
40.0000 mg | ORAL_TABLET | Freq: Every day | ORAL | Status: DC
  Administered 2015-04-03 – 2015-04-11 (×9): 40 mg via ORAL
  Filled 2015-04-03 (×3): qty 1
  Filled 2015-04-03: qty 2
  Filled 2015-04-03 (×8): qty 1
  Filled 2015-04-03: qty 2

## 2015-04-03 MED ORDER — VITAMIN B-1 100 MG PO TABS
100.0000 mg | ORAL_TABLET | Freq: Every day | ORAL | Status: DC
  Administered 2015-04-03 – 2015-04-11 (×9): 100 mg via ORAL
  Filled 2015-04-03 (×13): qty 1

## 2015-04-03 MED ORDER — CHLORDIAZEPOXIDE HCL 25 MG PO CAPS: 25.0000 mg | ORAL_CAPSULE | ORAL | Status: DC | PRN

## 2015-04-03 MED ORDER — ALBUTEROL SULFATE HFA 108 (90 BASE) MCG/ACT IN AERS
2.0000 | INHALATION_SPRAY | Freq: Four times a day (QID) | RESPIRATORY_TRACT | Status: DC | PRN
  Administered 2015-04-07: 2 via RESPIRATORY_TRACT
  Filled 2015-04-03: qty 6.7

## 2015-04-03 MED ORDER — INSULIN GLARGINE 100 UNIT/ML ~~LOC~~ SOLN
30.0000 [IU] | Freq: Every day | SUBCUTANEOUS | Status: DC
  Administered 2015-04-04 – 2015-04-10 (×7): 30 [IU] via SUBCUTANEOUS

## 2015-04-03 MED ORDER — ATORVASTATIN CALCIUM 40 MG PO TABS
40.0000 mg | ORAL_TABLET | Freq: Every evening | ORAL | Status: DC
  Administered 2015-04-03 – 2015-04-10 (×8): 40 mg via ORAL
  Filled 2015-04-03 (×11): qty 1
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
## 2015-04-03 NOTE — Tx Team (Signed)
Interdisciplinary Treatment Plan Update (Adult) Date: 04/03/2015    Time Reviewed: 9:30 AM  Progress in Treatment: Attending groups: Continuing to assess, patient new to milieu Participating in groups: Continuing to assess, patient new to milieu Taking medication as prescribed: Yes Tolerating medication: Yes Family/Significant other contact made: No, CSW assessing for appropriate contacts Patient understands diagnosis: Yes Discussing patient identified problems/goals with staff: Yes Medical problems stabilized or resolved: Yes Denies suicidal/homicidal ideation: Yes Issues/concerns per patient self-inventory: Yes Other:  New problem(s) identified: N/A  Discharge Plan or Barriers: CSW continuing to assess, patient new to milieu.  Reason for Continuation of Hospitalization:  Depression Anxiety Medication Stabilization   Comments: N/A  Estimated length of stay: 3-5 days   Patient is a 49 year old female admitted for increasing depression, SI, history of Bipolar I Disorder. Patient lives in [place] with her son. Patient will benefit from crisis stabilization, medication evaluation, group therapy, and psycho education in addition to case management for discharge planning. Patient and CSW reviewed pt's identified goals and treatment plan. Pt verbalized understanding and agreed to treatment plan.     Review of initial/current patient goals per problem list:  1. Goal(s): Patient will participate in aftercare plan   Met: No   Target date: 3-5 days post admission date   As evidenced by: Patient will participate within aftercare plan AEB aftercare provider and housing plan at discharge being identified. 10/18: Goal not met: CSW assessing for appropriate referrals for pt and will have follow up secured prior to d/c.      2. Goal (s): Patient will exhibit decreased depressive symptoms and suicidal ideations.   Met: No   Target date: 3-5 days post admission  date   As evidenced by: Patient will utilize self rating of depression at 3 or below and demonstrate decreased signs of depression or be deemed stable for discharge by MD. 10/18: Goal not met: Pt presents with flat affect and depressed mood.  Pt admitted with depression rating of 10.  Pt to show decreased sign of depression and a rating of 3 or less before d/c.      5. Goal(s): Patient will demonstrate decreased signs of psychosis  * Met: No  * Target date: 3-5 days post admission date  * As evidenced by: Patient will demonstrate decreased frequency of AVH or return to baseline function 10/18: Goal not met: Pt to take medication as prescribed to decrease psychosis to baseline.       Attendees: Patient:  04/03/2015 9:52 AM   Family:  04/03/2015 9:52 AM   Physician: [name], MD 04/03/2015 9:52 AM   Nursing: Mayra Neer, Izora Gala RN 04/03/2015 9:52 AM   CSW: Erasmo Downer Daysha Ashmore, [place]  04/03/2015 9:52 AM   Other:  04/03/2015 9:52 AM   Other:  04/03/2015 9:52 AM   Other: Lars Pinks, Nurse CM 04/03/2015 9:52 AM   Other: Lucinda Dell, Beverly Sessions TCT 04/03/2015 9:52 AM   Other:  04/03/2015 9:52 AM   Other:  04/03/2015 9:52 AM                   Scribe for Treatment Team:  Tilden Fossa, MSW, [employer] [phone]

## 2015-04-03 NOTE — BHH Suicide Risk Assessment (Signed)
[facility] Admission Suicide Risk Assessment   Nursing information obtained from:    Demographic factors:    Current Mental Status:    Loss Factors:    Historical Factors:    Risk Reduction Factors:    Total Time spent with patient: 30 minutes Principal Problem: Schizoaffective disorder, bipolar type ([place]) Diagnosis:   Patient Active Problem List   Diagnosis Date Noted  . Cocaine use disorder, moderate, in early remission [F14.90] 04/03/2015  . Schizoaffective disorder, bipolar type ([place]) [F25.0] 04/03/2015  . Conversion disorder [F44.9] 12/09/2014  . Acute ischemic stroke ([place]) [I63.9] 12/08/2014  . Left-sided weakness [M62.89] 12/08/2014  . 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 Knee pain [M25.569] 10/17/2010  . HYPERGLYCEMIA [R73.09] 08/16/2010  . MAMMOGRAM, ABNORMAL, RIGHT [R92.8] 08/05/2010  . HYPERLIPIDEMIA [E78.5] 06/21/2010  . AMENORRHEA, SECONDARY [N91.2] 10/29/2009  . 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 HELICOBACTER PYLORI INFECTION [A04.8] 04/02/2009  . GERD [K21.9] 10/11/2007  . Essential hypertension [I10] 02/26/2007     Continued Clinical Symptoms:  Alcohol Use Disorder Identification Test Final Score (AUDIT): 17 The "Alcohol Use Disorders Identification Test", Guidelines for Use in Primary Care, Second Edition.  World Pharmacologist [facility] [place]). Score between 0-7:  no or low risk or alcohol related problems. Score between 8-15:  moderate risk of alcohol related problems. Score between 16-19:  high risk of alcohol related problems. Score 20 or above:  warrants further diagnostic evaluation for alcohol dependence and treatment.   CLINICAL FACTORS:   Previous Psychiatric Diagnoses and Treatments Medical Diagnoses and Treatments/Surgeries   Musculoskeletal: Strength & Muscle Tone: within normal limits Gait & Station: normal Patient leans: N/A  Psychiatric Specialty Exam: Physical Exam  ROS  Blood pressure 142/100, pulse 91, temperature 98.3  F (36.8 C), temperature source Oral, resp. rate 16, height 5\' 5"  (1.651 m), weight 105.688 kg (233 lb).Body mass index is 38.77 kg/(m^2).                      Please see H&P.                                    COGNITIVE FEATURES THAT CONTRIBUTE TO RISK:  Closed-mindedness, Polarized thinking and Thought constriction (tunnel vision)    SUICIDE RISK:   Moderate:  Frequent suicidal ideation with limited intensity, and duration, some specificity in terms of plans, no associated intent, good self-control, limited dysphoria/symptomatology, some risk factors present, and identifiable protective factors, including available and accessible social support.  PLAN OF CARE: Please see H&P.   Medical Decision Making:  Review of Psycho-Social Stressors (1), Review or order clinical lab tests (1), Review and summation of old records (2), Established Problem, Worsening (2), Review or order medicine tests (1), Review of Medication Regimen & Side Effects (2) and Review of New Medication or Change in Dosage (2)  I certify that inpatient services furnished can reasonably be expected to improve the patient's condition.   Sahian Kerney md 04/03/2015, 12:19 PM

## 2015-04-03 NOTE — Tx Team (Signed)
Initial Interdisciplinary Treatment Plan   PATIENT STRESSORS: Medication change or noncompliance Substance abuse   PATIENT STRENGTHS: Ability for insight Active sense of humor Average or above average intelligence Capable of independent living Communication skills General fund of knowledge Motivation for treatment/growth   PROBLEM LIST: Problem List/Patient Goals Date to be addressed Date deferred Reason deferred Estimated date of resolution  Depression 04/03/15     Suicidal thoughts 04/03/15     "I feel depressed when I'm by myself" 04/03/15     "I'm an alcoholic" 62/94/76                                    DISCHARGE CRITERIA:  Ability to meet basic life and health needs Improved stabilization in mood, thinking, and/or behavior Verbal commitment to aftercare and medication compliance  PRELIMINARY DISCHARGE PLAN: Attend aftercare/continuing care group Return to previous living arrangement  PATIENT/FAMIILY INVOLVEMENT: This treatment plan has been presented to and reviewed with the patient, Maureen Swanson, and/or family member, .  The patient and family have been given the opportunity to ask questions and make suggestions.  [place], [place] 04/03/2015, 1:08 AM

## 2015-04-03 NOTE — Progress Notes (Addendum)
NUTRITION ASSESSMENT  Pt identified as at risk on the Malnutrition Screen Tool  INTERVENTION: 1. Supplements: continue Ensure Enlive po BID, each supplement provides 350 kcal and 20 grams of protein   NUTRITION DIAGNOSIS: Unintentional weight loss related to sub-optimal intake as evidenced by pt report.   Goal: Pt to meet >/= 90% of their estimated nutrition needs.  Monitor:  PO intake  Assessment:  Pt admitted with depression and cocaine use. PMHx of diabetes. Per RN note, pt reports drinking on a daily basis. Suspect poor quality diet PTA. Ensure supplements have been ordered.  Per weight history, pt has lost 7 lb since 9/22 (3% weight loss x 1 month, insignificant for time frame).  Height: Ht Readings from Last 1 Encounters:  04/03/15 5\' 5"  (1.651 m)    Weight: Wt Readings from Last 1 Encounters:  04/03/15 233 lb (105.688 kg)    Weight Hx: Wt Readings from Last 10 Encounters:  04/03/15 233 lb (105.688 kg)  03/08/15 240 lb (108.863 kg)  12/14/14 237 lb (107.502 kg)  12/08/14 231 lb 0.7 oz (104.8 kg)  11/23/14 235 lb (106.595 kg)  09/14/14 239 lb (108.41 kg)  09/07/14 239 lb (108.41 kg)  08/15/14 239 lb (108.41 kg)  07/21/14 218 lb (98.884 kg)  02/17/14 202 lb (91.627 kg)    BMI:  Body mass index is 38.77 kg/(m^2). Pt meets criteria for obesity based on current BMI.  Estimated Nutritional Needs: Kcal: 25-30 kcal/kg Protein: > 1 gram protein/kg Fluid: 1 ml/kcal  Diet Order: Diet Heart Room service appropriate?: Yes; Fluid consistency:: Thin Pt is also offered choice of unit snacks mid-morning and mid-afternoon.  Pt is eating as desired.   Lab results and medications reviewed.   Clayton Bibles, MS, RD, LDN Pager: [phone] After Hours Pager: [phone]

## 2015-04-03 NOTE — BHH Group Notes (Signed)
[place] LCSW Group Therapy 04/03/2015  1:15 PM   Type of Therapy: Group Therapy  Participation Level: Did Not Attend. Patient invited to participate but declined.   Tilden Fossa, MSW, [place] Worker [facility] [phone]
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
## 2015-04-03 NOTE — Progress Notes (Signed)
Patient ID: Maureen Swanson, female   DOB: [birth year], 49 y.o.   MRN: [id number]  Adult Psychoeducational Group Note  Date:  04/03/2015 Time:  09:35am  Group Topic/Focus:  Recovery Goals:   The focus of this group is to identify appropriate goals for recovery and establish a plan to achieve them.  Participation Level:  Did Not Attend  Participation Quality: n/a  Affect: n/a  Cognitive: n/a  Insight: n/a  Engagement in Group:  n/a  Modes of Intervention:  Activity, Education and Support  Additional Comments:  Pt did not attend group. Pt in bed asleep.   [name] 04/03/2015, 10:43 AM

## 2015-04-03 NOTE — Progress Notes (Signed)
Patient ID: Maureen Swanson, female   DOB: 1966-01-22, 49 y.o.   MRN: [id number] D  ---   Pt denies pain or dis-comfort this shift.   She is pleasant and cooperative and interacts well with peers and staff.     Pt. Attends groups and has fair eye contact on approach.   Pt. Has many prescribed medications and shows no sign of adverse reactions.  He appears anxious at times  But has had no issues with anxiety.   She has slept in room most of day but sits in dayroom  At times.  --- A ---  Support, safety cks and meds provided.  --- R ---  Pt. Remains safe on ubnit
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
## 2015-04-04 LAB — URINALYSIS W MICROSCOPIC (NOT AT ARMC)
BILIRUBIN URINE: NEGATIVE
GLUCOSE, UA: NEGATIVE mg/dL
Hgb urine dipstick: NEGATIVE
KETONES UR: NEGATIVE mg/dL
NITRITE: NEGATIVE
PH: 7 (ref 5.0–8.0)
PROTEIN: NEGATIVE mg/dL
Specific Gravity, Urine: 1.025 (ref 1.005–1.030)
UROBILINOGEN UA: 0.2 mg/dL (ref 0.0–1.0)

## 2015-04-04 LAB — BASIC METABOLIC PANEL
Anion gap: 11 (ref 5–15)
BUN: 16 mg/dL (ref 6–20)
CHLORIDE: 99 mmol/L — AB (ref 101–111)
CO2: 29 mmol/L (ref 22–32)
Calcium: 9.7 mg/dL (ref 8.9–10.3)
Creatinine, Ser: 0.9 mg/dL (ref 0.44–1.00)
Glucose, Bld: 146 mg/dL — ABNORMAL HIGH (ref 65–99)
POTASSIUM: 3.5 mmol/L (ref 3.5–5.1)
SODIUM: 139 mmol/L (ref 135–145)

## 2015-04-04 LAB — LIPID PANEL
Cholesterol: 153 mg/dL (ref 0–200)
HDL: 47 mg/dL (ref >40)
LDL CALC: 73 mg/dL (ref 0–99)
Total CHOL/HDL Ratio: 3.3 RATIO
Triglycerides: 164 mg/dL — ABNORMAL HIGH (ref <150)
VLDL: 33 mg/dL (ref 0–40)

## 2015-04-04 LAB — GLUCOSE, CAPILLARY
GLUCOSE-CAPILLARY: 106 mg/dL — AB (ref 65–99)
GLUCOSE-CAPILLARY: 150 mg/dL — AB (ref 65–99)
GLUCOSE-CAPILLARY: 139 mg/dL — AB (ref 65–99)
Glucose-Capillary: 142 mg/dL — ABNORMAL HIGH (ref 65–99)

## 2015-04-04 LAB — TSH: TSH: 1.169 u[IU]/mL (ref 0.350–4.500)

## 2015-04-04 MED ORDER — NITROFURANTOIN MACROCRYSTAL 100 MG PO CAPS
100.0000 mg | ORAL_CAPSULE | Freq: Two times a day (BID) | ORAL | Status: DC
  Administered 2015-04-04 – 2015-04-10 (×12): 100 mg via ORAL
  Filled 2015-04-04 (×14): qty 1
  Filled 2015-04-04: qty 2

## 2015-04-04 MED ORDER — QUETIAPINE FUMARATE ER 300 MG PO TB24
500.0000 mg | ORAL_TABLET | Freq: Every day | ORAL | Status: DC
  Administered 2015-04-04 – 2015-04-05 (×2): 500 mg via ORAL
  Filled 2015-04-04 (×3): qty 1

## 2015-04-04 NOTE — Plan of Care (Signed)
Problem: Alteration in mood Goal: LTG-Patient reports reduction in suicidal thoughts (Patient reports reduction in suicidal thoughts and is able to verbalize a safety plan for whenever patient is feeling suicidal)  Outcome: Progressing Pt denies SI at this time     

## 2015-04-04 NOTE — [facility] Group Notes (Signed)
[place] [facility] LCSW Group Therapy  04/04/2015 3:31 PM  Type of Therapy: Group Therapy  Participation Level: Active  Participation Quality: Attentive  Affect: Flat  Cognitive: Oriented  Insight: Limited  Engagement in Therapy: Engaged  Modes of Intervention: Discussion and Socialization  Summary of Progress/Problems: [name] from the [facility] was here to tell his story of recovery and play his guitar. Pt was alert and pleasant throughout the group but did not participate.  Kara Mead. Marshell Levan 04/04/2015 3:31 PM

## 2015-04-04 NOTE — BHH Group Notes (Signed)
[facility] LCSW Aftercare Discharge Planning Group Note   04/04/2015 1:15 PM  Participation Quality:  Engaged  Mood/Affect:  Flat  Depression Rating:    Anxiety Rating:    Thoughts of Suicide:  Yes Will you contract for safety?   Yes  Current AVH:  Yes  Plan for Discharge/Comments:  "I completed the [facility] program when I left here the last time.  I was there for four months.  I could not get into [facility], so I have been staying here and there.  I did not have a sobriety plan when I left.  I want to go to [facility] again so that my son and his girlfriend will see I am serious and take me back in.  Please fax my information to Eugene Gavia, esq. about my disability.  I have seen two Docs so far."  Tried to point out to her that it is not rehab she needs, but stable housing.  However, with no income there is no such thing, so I agreed to send a referral to [facility].  I also offered [facility].  She balked as she said it is too far away, but did not rule it out completely.  Transportation Means:   Supports:  [name]

## 2015-04-04 NOTE — Progress Notes (Signed)
Adult Psychoeducational Group Note  Date:  04/04/2015 Time:  9:50 PM  Group Topic/Focus:  Wrap-Up Group:   The focus of this group is to help patients review their daily goal of treatment and discuss progress on daily workbooks.  Participation Level:  Active  Participation Quality:  Appropriate  Affect:  Appropriate  Cognitive:  Alert  Insight: Appropriate  Engagement in Group:  Engaged  Modes of Intervention:  Discussion  Additional Comments:  On a scale from 1-10 (1 being the worst, 10 being the best), patient rated her day as an 8. Patient stated her day was so-so. Patient stated she had a good time when her sister came to visit.  Money Mckeithan L Terel Bann 04/04/2015, 9:50 PM

## 2015-04-04 NOTE — Progress Notes (Signed)
[facility] MD Progress Note  04/04/2015 2:31 PM CHAMPAGNE PALETTA  MRN:  [id number] Subjective: patient states " I still have [place] asking me to kill self and other ."  Objective:Maureen Swanson is a 49 y.o. AA female with history of schizoaffective disorder , lives with her son in [place] , presented with worseing depression and anxiety for the past 2 weeks. Patient per initial notes in EHR Stated " hopelessness, fatigue, loss of interest in usual pleasures, and despondence. Patient's symptoms are triggered by loneliness and "Not having anyone around to talk to me". Patient's anxiety is associated with daily panic attacks. She has suicidal thoughts to overdose, run into traffic, and/or cut self. "  Patient seen and chart reviewed.Discussed patient with treatment team. Pt today continues to be withdrawn , depressed. Reports AH asking her to kill self and others. She continues to struggle with paranoia. Pt reports sleep as improved. She denies any withdrawal sx today. Pt is very motivated to get help with her substance abuse issues - is motivated to go to an inpatient program. Per staff - pt continues to be psychotic , requires support.      Principal Problem: Schizoaffective disorder, bipolar type ([place]) Diagnosis:   Patient Active Problem List   Diagnosis Date Noted  . Cocaine use disorder, moderate, in early remission [F14.90] 04/03/2015  . Schizoaffective disorder, bipolar type ([place]) [F25.0] 04/03/2015  . Diabetes ([place]) [E11.9] 04/03/2015  . Alcohol use disorder, moderate, dependence ([place]) [F10.20] 04/03/2015  . Conversion disorder [F44.9] 12/09/2014  . Acute ischemic stroke ([place]) [I63.9] 12/08/2014  . Left-sided weakness [M62.89] 12/08/2014  . 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 Knee pain [M25.569] 10/17/2010  . HYPERGLYCEMIA [R73.09] 08/16/2010  . MAMMOGRAM, ABNORMAL, RIGHT [R92.8] 08/05/2010  . HYPERLIPIDEMIA [E78.5] 06/21/2010  . AMENORRHEA, SECONDARY [N91.2] 10/29/2009  .  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 HELICOBACTER PYLORI INFECTION [A04.8] 04/02/2009  . GERD [K21.9] 10/11/2007  . Essential hypertension [I10] 02/26/2007   Total Time spent with patient: 30 minutes  Past Psychiatric History: Patient with several admissions to [facility] as well as [facility]. Pt with hx of suicide attempts in the past x3, OD as well as cutting self  Past Medical History:  Past Medical History  Diagnosis Date  . High cholesterol   . Depression   . Gout   . Anxiety   . Bipolar 1 disorder ([place])   . GERD (gastroesophageal reflux disease)   . Substance abuse     crack cocaine  . Headache(784.0)     migraines  . TMJ (temporomandibular joint disorder)   . Asthma     daily and prn inhalers  . Arthritis     back and knees  . Gastric ulcer   . 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 Hypertension     under control, has been on med. x 12 yrs.  . Diabetes mellitus     diet-controlled    Past Surgical History  Procedure Laterality Date  . Knee arthroscopy w/ partial medial meniscectomy  05/01/2010    right  . Breast lumpectomy with needle localization  04/19/2012    Procedure: BREAST LUMPECTOMY WITH NEEDLE LOCALIZATION;  Surgeon: [name], MD;  Location: [facility];  Service: General;  Laterality: Right;   Family History:  Family History  Problem Relation Age of Onset  . Diabetes Mother   . Breast cancer Mother   . Cancer Father   .  Bipolar disorder Maternal Aunt     Family Psychiatric History:Aunt has hx of bipolar disorder. Denies suicide in family.  Social History: Currently is single, lives with son in [place]. Hx of being in prison for 30 days , a year and half ago for assault on ex boyfriend. History        Social History:  History  Alcohol Use  . 0.0 oz/week    Comment: reports she drinks 3 40 ounce beers daily     History  Drug Use No    Comment: last use 08/02/2012    Social History   Social History   . Marital Status: Single    Spouse Name: N/A  . Number of Children: N/A  . Years of Education: N/A   Social History Main Topics  . Smoking status: Current Every Day Smoker -- 0.50 packs/day for 15 years    Types: Cigarettes    Last Attempt to Quit: 08/02/2012  . Smokeless tobacco: Never Used     Comment: quit smoking 02/2012  . Alcohol Use: 0.0 oz/week     Comment: reports she drinks 3 40 ounce beers daily  . Drug Use: No     Comment: last use 08/02/2012  . Sexual Activity: Not Asked   Other Topics Concern  . None   Social History Narrative   Additional Social History:                         Sleep: Good  Appetite:  Fair  Current Medications: Current Facility-Administered Medications  Medication Dose Route Frequency Provider Last Rate Last Dose  . acetaminophen (TYLENOL) tablet 650 mg  650 mg Oral Q6H PRN Patrecia Pour, NP      . albuterol (PROVENTIL HFA;VENTOLIN HFA) 108 (90 BASE) MCG/ACT inhaler 2 puff  2 puff Inhalation Q6H PRN Patrecia Pour, NP      . alum & mag hydroxide-simeth (MAALOX/MYLANTA) 200-200-20 MG/5ML suspension 30 mL  30 mL Oral Q4H PRN Patrecia Pour, NP      . amLODipine (NORVASC) tablet 5 mg  5 mg Oral Daily Patrecia Pour, NP   5 mg at 04/04/15 0826  . aspirin chewable tablet 81 mg  81 mg Oral Daily Patrecia Pour, NP   81 mg at 04/04/15 5732  . atenolol (TENORMIN) tablet 100 mg  100 mg Oral Daily Patrecia Pour, NP   100 mg at 04/04/15 0825  . atorvastatin (LIPITOR) tablet 40 mg  40 mg Oral QPM Patrecia Pour, NP   40 mg at 04/03/15 1700  . baclofen (LIORESAL) tablet 10 mg  10 mg Oral TID Patrecia Pour, NP   10 mg at 04/04/15 1212  . haloperidol (HALDOL) tablet 5 mg  5 mg Oral Q6H PRN [name], MD       And  . benztropine (COGENTIN) tablet 1 mg  1 mg Oral Q6H PRN [name], MD      . calcium carbonate (OS-CAL - dosed in mg of elemental calcium) tablet 500 mg of elemental calcium  1 tablet Oral Q breakfast Patrecia Pour, NP    500 mg of elemental calcium at 04/04/15 0825  . celecoxib (CELEBREX) capsule 100 mg  100 mg Oral BID Patrecia Pour, NP   100 mg at 04/04/15 0826  . chlordiazePOXIDE (LIBRIUM) capsule 25 mg  25 mg Oral Q4H PRN [name], MD      . feeding supplement (ENSURE ENLIVE) (ENSURE  ENLIVE) liquid 237 mL  237 mL Oral BID BM Sonnia Strong, MD   237 mL at 04/04/15 1038  . folic acid (FOLVITE) tablet 1 mg  1 mg Oral Daily Patrecia Pour, NP   1 mg at 04/04/15 0825  . furosemide (LASIX) tablet 40 mg  40 mg Oral Daily Patrecia Pour, NP   40 mg at 04/04/15 0826  . gabapentin (NEURONTIN) capsule 400 mg  400 mg Oral BID Patrecia Pour, NP   400 mg at 04/04/15 0825  . hydrochlorothiazide (HYDRODIURIL) tablet 25 mg  25 mg Oral Daily Patrecia Pour, NP   25 mg at 04/04/15 0826  . hydrOXYzine (ATARAX/VISTARIL) tablet 25 mg  25 mg Oral Q6H PRN Patrecia Pour, NP   25 mg at 04/03/15 0050  . ibuprofen (ADVIL,MOTRIN) tablet 600 mg  600 mg Oral Q8H PRN Patrecia Pour, NP   600 mg at 04/04/15 2542  . insulin aspart (novoLOG) injection 0-15 Units  0-15 Units Subcutaneous TID WC [name], MD   2 Units at 04/04/15 1214  . insulin glargine (LANTUS) injection 30 Units  30 Units Subcutaneous QHS Patrecia Pour, NP   30 Units at 04/03/15 2200  . lamoTRIgine (LAMICTAL) tablet 25 mg  25 mg Oral QPM Jerzee Jerome, MD   25 mg at 04/03/15 1702  . linagliptin (TRADJENTA) tablet 5 mg  5 mg Oral Daily Patrecia Pour, NP   5 mg at 04/04/15 0826  . loratadine (CLARITIN) tablet 10 mg  10 mg Oral Daily Patrecia Pour, NP   10 mg at 04/04/15 0826  . magnesium hydroxide (MILK OF MAGNESIA) suspension 30 mL  30 mL Oral Daily PRN Patrecia Pour, NP      . metFORMIN (GLUCOPHAGE) tablet 1,000 mg  1,000 mg Oral BID WC Patrecia Pour, NP   1,000 mg at 04/04/15 0825  . metoCLOPramide (REGLAN) tablet 10 mg  10 mg Oral TID AC & HS Patrecia Pour, NP   10 mg at 04/04/15 1212  . multivitamin with minerals tablet 1 tablet  1 tablet Oral Daily  Patrecia Pour, NP   1 tablet at 04/04/15 0825  . naltrexone (DEPADE) tablet 50 mg  50 mg Oral Daily Patrecia Pour, NP   50 mg at 04/04/15 0825  . nicotine (NICODERM CQ - dosed in mg/24 hours) patch 21 mg  21 mg Transdermal Daily Patrecia Pour, NP   21 mg at 04/04/15 0830  . ondansetron (ZOFRAN) tablet 4 mg  4 mg Oral Q8H PRN Patrecia Pour, NP      . Oxcarbazepine (TRILEPTAL) tablet 600 mg  600 mg Oral Daily Patrecia Pour, NP   600 mg at 04/04/15 0825  . pantoprazole (PROTONIX) EC tablet 40 mg  40 mg Oral Daily Patrecia Pour, NP   40 mg at 04/04/15 0825  . potassium chloride SA (K-DUR,KLOR-CON) CR tablet 20 mEq  20 mEq Oral Daily Patrecia Pour, NP   20 mEq at 04/04/15 0825  . QUEtiapine (SEROQUEL XR) 24 hr tablet 500 mg  500 mg Oral Q2000 Kelby Lotspeich, MD      . thiamine (VITAMIN B-1) tablet 100 mg  100 mg Oral Daily Patrecia Pour, NP   100 mg at 04/04/15 7062    Lab Results:  Results for orders placed or performed during the hospital encounter of 04/03/15 (from the past 48 hour(s))  Glucose, capillary     Status: None  Collection Time: 04/03/15  6:06 AM  Result Value Ref Range   Glucose-Capillary 92 65 - 99 mg/dL   Comment 1 Notify RN   Glucose, capillary     Status: Abnormal   Collection Time: 04/03/15 11:41 AM  Result Value Ref Range   Glucose-Capillary 154 (H) 65 - 99 mg/dL  Glucose, capillary     Status: Abnormal   Collection Time: 04/03/15 12:06 PM  Result Value Ref Range   Glucose-Capillary 134 (H) 65 - 99 mg/dL  Glucose, capillary     Status: Abnormal   Collection Time: 04/03/15  5:04 PM  Result Value Ref Range   Glucose-Capillary 154 (H) 65 - 99 mg/dL  Glucose, capillary     Status: Abnormal   Collection Time: 04/03/15  8:42 PM  Result Value Ref Range   Glucose-Capillary 105 (H) 65 - 99 mg/dL  Glucose, capillary     Status: Abnormal   Collection Time: 04/04/15  6:09 AM  Result Value Ref Range   Glucose-Capillary 106 (H) 65 - 99 mg/dL   Comment 1 Notify RN    Urinalysis with microscopic     Status: Abnormal   Collection Time: 04/04/15  6:11 AM  Result Value Ref Range   Color, Urine YELLOW YELLOW   APPearance CLOUDY (A) CLEAR   Specific Gravity, Urine 1.025 1.005 - 1.030   pH 7.0 5.0 - 8.0   Glucose, UA NEGATIVE NEGATIVE mg/dL   Hgb urine dipstick NEGATIVE NEGATIVE   Bilirubin Urine NEGATIVE NEGATIVE   Ketones, ur NEGATIVE NEGATIVE mg/dL   Protein, ur NEGATIVE NEGATIVE mg/dL   Urobilinogen, UA 0.2 0.0 - 1.0 mg/dL   Nitrite NEGATIVE NEGATIVE   Leukocytes, UA LARGE (A) NEGATIVE   WBC, UA 7-10 <3 WBC/hpf   Bacteria, UA FEW (A) RARE   Squamous Epithelial / LPF RARE RARE    Comment: Performed at [facility]  TSH     Status: None   Collection Time: 04/04/15  6:34 AM  Result Value Ref Range   TSH 1.169 0.350 - 4.500 uIU/mL    Comment: Performed at [facility]  Lipid panel     Status: Abnormal   Collection Time: 04/04/15  6:34 AM  Result Value Ref Range   Cholesterol 153 0 - 200 mg/dL   Triglycerides 164 (H) <150 mg/dL   HDL 47 >40 mg/dL   Total CHOL/HDL Ratio 3.3 RATIO   VLDL 33 0 - 40 mg/dL   LDL Cholesterol 73 0 - 99 mg/dL    Comment:        Total Cholesterol/HDL:CHD Risk Coronary Heart Disease Risk Table                     Men   Women  1/2 Average Risk   3.4   3.3  Average Risk       5.0   4.4  2 X Average Risk   9.6   7.1  3 X Average Risk  23.4   11.0        Use the calculated Patient Ratio above and the CHD Risk Table to determine the patient's CHD Risk.        ATP III CLASSIFICATION (LDL):  <100     mg/dL   Optimal  100-129  mg/dL   Near or Above                    Optimal  130-159  mg/dL   Borderline  160-189  mg/dL   High  >190     mg/dL   Very High Performed at [facility] metabolic panel     Status: Abnormal   Collection Time: 04/04/15  6:34 AM  Result Value Ref Range   Sodium 139 135 - 145 mmol/L   Potassium 3.5 3.5 - 5.1 mmol/L   Chloride 99 (L) 101 -  111 mmol/L   CO2 29 22 - 32 mmol/L   Glucose, Bld 146 (H) 65 - 99 mg/dL   BUN 16 6 - 20 mg/dL   Creatinine, Ser 0.90 0.44 - 1.00 mg/dL   Calcium 9.7 8.9 - 10.3 mg/dL   GFR calc non Af Amer >60 >60 mL/min   GFR calc Af Amer >60 >60 mL/min    Comment: (NOTE) The eGFR has been calculated using the CKD EPI equation. This calculation has not been validated in all clinical situations. eGFR's persistently <60 mL/min signify possible Chronic Kidney Disease.    Anion gap 11 5 - 15    Comment: Performed at [facility]  Glucose, capillary     Status: Abnormal   Collection Time: 04/04/15 12:09 PM  Result Value Ref Range   Glucose-Capillary 142 (H) 65 - 99 mg/dL    Physical Findings: AIMS: Facial and Oral Movements Muscles of Facial Expression: None, normal Lips and Perioral Area: None, normal Jaw: None, normal Tongue: None, normal,Extremity Movements Upper (arms, wrists, hands, fingers): None, normal Lower (legs, knees, ankles, toes): None, normal, Trunk Movements Neck, shoulders, hips: None, normal, Overall Severity Severity of abnormal movements (highest score from questions above): None, normal Incapacitation due to abnormal movements: None, normal Patient's awareness of abnormal movements (rate only patient's report): No Awareness, Dental Status Current problems with teeth and/or dentures?: No Does patient usually wear dentures?: No  CIWA:  CIWA-Ar Total: 2 COWS:     Musculoskeletal: Strength & Muscle Tone: within normal limits Gait & Station: normal Patient leans: N/A  Psychiatric Specialty Exam: Review of Systems  Psychiatric/Behavioral: Positive for depression, suicidal ideas, hallucinations and substance abuse. The patient is nervous/anxious.   All other systems reviewed and are negative.   Blood pressure 112/69, pulse 81, temperature 98.8 F (37.1 C), temperature source Oral, resp. rate 18, height _0  (1.651 m), weight 105.688 kg (233 lb).Body mass  index is 38.77 kg/(m^2).  General Appearance: Fairly Groomed  Engineer, water::  Fair  Speech:  Clear and Coherent  Volume:  Decreased  Mood:  Anxious and Depressed  Affect:  Congruent  Thought Process:  Disorganized  Orientation:  Full (Time, Place, and Person)  Thought Content:  Delusions, Hallucinations: Auditory Command:  asking her to kill self and others, Paranoid Ideation and Rumination  Suicidal Thoughts:  Yes.  without intent/plan reducing  Homicidal Thoughts:  Yes.  without intent/planreducing  Memory:  Immediate;   Fair Recent;   Fair Remote;   Fair  Judgement:  Impaired  Insight:  Shallow  Psychomotor Activity:  Restlessness  Concentration:  Poor  Recall:  [employer] of Knowledge:Fair  Language: Fair  Akathisia:  No    AIMS (if indicated):     Assets:  Communication Skills Desire for Improvement  ADL's:  Intact  Cognition: WNL  Sleep:  Number of Hours: 6   Treatment Plan Summary: Daily contact with patient to assess and evaluate symptoms and progress in treatment and Medication management  Will increase Seroquel to 500 mg po qhs for psychosis, sleep. Will continue Trileptal 600 mg  po daily for mood lability. Will continue Lamictal 25 mg po qpm for mood sx. Will restart home medications where needed. Will continue  CIWA /Librium prn for withdrawal sx. Will continue to monitor vitals ,medication compliance and treatment side effects while patient is here.  Will monitor for medical issues as well as call consult as needed.  Reviewed labs  tsh- wnl  , lipid panel- dyslipidemia ( will consult dietician), UA - large leukocytes- will get urine clx.. CSW will start working on disposition. Pt to be referred to substance abuse program,. Patient to participate in therapeutic milieu .       Jenissa Tyrell MD 04/04/2015, 2:31 PM

## 2015-04-04 NOTE — Progress Notes (Signed)
D:  Pt +ve AH Pt denies SI/HI/VH. Pt is pleasant and cooperative. Pt stated she felt about the same since coming in, she said she hasn't had time to start feeling better.   A: Pt was offered support and encouragement. Pt was given scheduled medications. Pt was encourage to attend groups. Q 15 minute checks were done for safety.   R:Pt attends groups and interacts well with peers and staff. Pt is taking medication. Pt has no complaints at this time .Pt receptive to treatment and safety maintained on unit.

## 2015-04-04 NOTE — Progress Notes (Signed)
DAR Note: Maureen Swanson has been visible on the unit.  Attending groups.  Interacting with peers.  She denies SI/HI.  She reports hearing voices that are telling her to hurt herself or others.  She is able to contract for safety on the unit.  She c/o knee pain 8/10 with no relief from celebrex or tylenol.   Encouraged her to talk with the doctor about the continued pain.  She appears to be in no physical distress.  She completed her self inventory and reports that her depression 9/10, hopelessness 9/10 and anxiety 8/10.  She states that her goal for today is to "learn to talk."  Encouraged participation in group and unit activities.  Q 15 minute checks maintained for safety.  We will continue to monitor the progress towards her goals.

## 2015-04-05 LAB — GLUCOSE, CAPILLARY
GLUCOSE-CAPILLARY: 120 mg/dL — AB (ref 65–99)
Glucose-Capillary: 134 mg/dL — ABNORMAL HIGH (ref 65–99)
Glucose-Capillary: 123 mg/dL — ABNORMAL HIGH (ref 65–99)
Glucose-Capillary: 123 mg/dL — ABNORMAL HIGH (ref 65–99)

## 2015-04-05 LAB — HEMOGLOBIN A1C
HEMOGLOBIN A1C: 6.4 % — AB (ref 4.8–5.6)
MEAN PLASMA GLUCOSE: 137 mg/dL

## 2015-04-05 LAB — PROLACTIN: PROLACTIN: 81.7 ng/mL — AB (ref 4.8–23.3)

## 2015-04-05 MED ORDER — TRAZODONE HCL 50 MG PO TABS
50.0000 mg | ORAL_TABLET | Freq: Every evening | ORAL | Status: DC | PRN
  Administered 2015-04-05: 50 mg via ORAL
  Filled 2015-04-05: qty 1

## 2015-04-05 NOTE — Progress Notes (Addendum)
D: Pt presents flat in affect and depressed in mood. "I feel miserable". Pt was visible and active within the milieu. Pt verbalizes awareness of her qhs medications. Pt endorses anxiety.  Pt is currently denying any SI/HI/VH. Pt positive for AH. Pt denies that the voices are negative in nature. No withdrawal symptoms observed or verbalized by patient.   A: Writer administered scheduled medications to pt, per MD orders. Continued support and availability as needed was extended to this pt. Staff continue to monitor pt with q13min checks.  R: No adverse drug reactions noted. Pt receptive to treatment. Pt remains safe at this time.

## 2015-04-05 NOTE — BHH Group Notes (Signed)
[place] LCSW Group Therapy  04/05/2015 1:15 pm  Type of Therapy: Process Group Therapy  Participation Level:  Active  Participation Quality:  Appropriate  Affect:  Flat  Cognitive:  Oriented  Insight:  Improving  Engagement in Group:  Limited  Engagement in Therapy:  Limited  Modes of Intervention:  Activity, Clarification, Education, Problem-solving and Support  Summary of Progress/Problems: Today's group addressed the issue of overcoming obstacles.  Patients were asked to identify their biggest obstacle post d/c that stands in the way of their on-going success, and then problem solve as to how to manage this.  Stayed the entire time.  Sat quietly throughout.  Did not contribute to the conversation.  Maureen Swanson 04/05/2015   3:08 PM

## 2015-04-05 NOTE — BHH Group Notes (Signed)
[facility] Group Notes:  (Nursing/MHT/Case Management/Adjunct)  Date:  04/05/2015  Time:  0930 Type of Therapy:  Nurse Education  Participation Level:  Active  Participation Quality:  Appropriate, Attentive and Sharing  Affect:  Appropriate  Cognitive:  Alert and Appropriate  Insight:  Appropriate, Good and Improving  Engagement in Group:  Engaged, Improving and Supportive  Modes of Intervention:  Activity, Discussion, Exploration and Support  Summary of Progress/Problems: Group topic was on leisure and lifestyle changes. Patient participated and was receptive.  Discussed her favorite musician and song.  States her goal is to "stay focus and work on herself." Mart Piggs 04/05/2015, 10:50 AM

## 2015-04-05 NOTE — Progress Notes (Signed)
DAR NOTE: Patient presents with anxious affect and depressed mood.  Denies suicidal thoughts and visual hallucinations.  Rates depression, hopelessness, and anxiety at 9/10.  Reports hearing voices but unable to make out what the voices are saying.  Maintained on routine safety checks.  Medications given as prescribed.  Patient requested and received Vistaril 25 mg for complain of anxiety with good effect.  Support and encouragement offered as needed.  Attended group and participated.  States goal for today is "to stop being so anxious."  Patient observed socializing with peers in the dayroom.  No signs/symptoms of hypo/hyperglycemia noted.  Insulin given per sliding scales.  Appetite good with adequate fluid intake.

## 2015-04-05 NOTE — Progress Notes (Signed)
Inpatient Diabetes Program Recommendations  AACE/ADA: New Consensus Statement on Inpatient Glycemic Control (2015)  Target Ranges:  Prepandial:   less than 140 mg/dL      Peak postprandial:   less than 180 mg/dL (1-2 hours)      Critically ill patients:  140 - 180 mg/dL    Results for SARIYA, TRICKEY (MRN [id number]) as of 04/05/2015 09:22  Ref. Range 04/03/2015 06:06 04/03/2015 11:41 04/03/2015 12:06 04/03/2015 17:04 04/03/2015 20:42  Glucose-Capillary Latest Ref Range: 65-99 mg/dL 92 154 (H) 134 (H) 154 (H) 105 (H)    Results for AINE, STRYCHARZ (MRN [id number]) as of 04/05/2015 09:22  Ref. Range 04/04/2015 06:09 04/04/2015 12:09 04/04/2015 16:48 04/04/2015 20:32  Glucose-Capillary Latest Ref Range: 65-99 mg/dL 106 (H) 142 (H) 150 (H) 139 (H)    Admit with: Schizoaffective disorder  History: DM, Cocaine/ETOH abuse  Home DM Meds: Lantus 30 units QHS       Januvia 50 mg daily       Metformin 1000 mg bid  Current Insulin Orders:  Lantus 30 units QHS            Tradjenta 5 mg daily          Metformin 1000 mg bid      Novolog Moderate SSI (0-15 units) TID AC     -CBGs well controlled so far on current DM regimen.  -No recommendations for medication adjustments at this time.    --Will follow patient during hospitalization--  Wyn Quaker RN, MSN, CDE Diabetes Coordinator Inpatient Glycemic Control Team Team Pager: [phone] (8a-5p)

## 2015-04-05 NOTE — Progress Notes (Signed)
D: Pt reports to be having a "good' day today. Pt observed interacting with others appropriately within the milieu. Pt is currently denying any SI/HI/AVH. Pt actively participates in group. Pt is seeking rehab at [facility]. A: Writer administered scheduled medications to pt, per MD orders. Continued support and availability as needed was extended to this pt. Staff continue to monitor pt with q29min checks.  R: No adverse drug reactions noted. Pt receptive to treatment. Pt remains safe at this time.

## 2015-04-05 NOTE — BHH Group Notes (Signed)
Pt attended group and socialized with peers.  Victorino Sparrow, MHT

## 2015-04-05 NOTE — Progress Notes (Signed)
[facility] MD Progress Note  04/05/2015 10:03 AM Maureen Swanson  MRN:  [id number] Subjective: patient states " I still feel the same,the AH may be a bit better. I did not sleep at all last night.'  Objective:Maureen Swanson is a 49 y.o. AA female with history of schizoaffective disorder , lives with her son in [place] , presented with worseing depression and anxiety for the past 2 weeks. Patient per initial notes in EHR Stated " hopelessness, fatigue, loss of interest in usual pleasures, and despondence. Patient's symptoms are triggered by loneliness and "Not having anyone around to talk to me". Patient's anxiety is associated with daily panic attacks. She has suicidal thoughts to overdose, run into traffic, and/or cut self. "  Patient seen and chart reviewed.Discussed patient with treatment team.  Pt continues to ruminate , is having AH - states AH may be better than before, but she still has them. Pt is on a higher dose of seroquel now, discussed we will monitor her here . Discussed adding a sleep aid along with seroquel , since pt has been complaining of sleep issues. Pt continues to be depressed , delayed . Continues to have some anxiety sx as well as tremors from alcohol withdrawal. CIWA reviewed, VS reviewed. Pt denies SI/HI today. Pt continues to be motivated to go to a substance abuse rehab program.     Principal Problem: Schizoaffective disorder, bipolar type ([place]) Diagnosis:   Patient Active Problem List   Diagnosis Date Noted  . Cocaine use disorder, moderate, in early remission [F14.90] 04/03/2015  . Schizoaffective disorder, bipolar type ([place]) [F25.0] 04/03/2015  . Diabetes ([place]) [E11.9] 04/03/2015  . Alcohol use disorder, moderate, dependence ([place]) [F10.20] 04/03/2015  . Conversion disorder [F44.9] 12/09/2014  . Acute ischemic stroke ([place]) [I63.9] 12/08/2014  . Left-sided weakness [M62.89] 12/08/2014  . 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 Knee pain [M25.569]  10/17/2010  . HYPERGLYCEMIA [R73.09] 08/16/2010  . MAMMOGRAM, ABNORMAL, RIGHT [R92.8] 08/05/2010  . HYPERLIPIDEMIA [E78.5] 06/21/2010  . AMENORRHEA, SECONDARY [N91.2] 10/29/2009  . 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 HELICOBACTER PYLORI INFECTION [A04.8] 04/02/2009  . GERD [K21.9] 10/11/2007  . Essential hypertension [I10] 02/26/2007   Total Time spent with patient: 25 minutes  Past Psychiatric History: Patient with several admissions to [facility] as well as [facility]. Pt with hx of suicide attempts in the past x3, OD as well as cutting self  Past Medical History:  Past Medical History  Diagnosis Date  . High cholesterol   . Depression   . Gout   . Anxiety   . Bipolar 1 disorder ([place])   . GERD (gastroesophageal reflux disease)   . Substance abuse     crack cocaine  . Headache(784.0)     migraines  . TMJ (temporomandibular joint disorder)   . Asthma     daily and prn inhalers  . Arthritis     back and knees  . Gastric ulcer   . 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 Hypertension     under control, has been on med. x 12 yrs.  . Diabetes mellitus     diet-controlled    Past Surgical History  Procedure Laterality Date  . Knee arthroscopy w/ partial medial meniscectomy  05/01/2010    right  . Breast lumpectomy with needle localization  04/19/2012    Procedure: BREAST LUMPECTOMY WITH NEEDLE LOCALIZATION;  Surgeon: [name], MD;  Location: [facility];  Service: General;  Laterality: Right;   Family History:  Family History  Problem Relation Age of Onset  . Diabetes Mother   . Breast cancer Mother   . Cancer Father   . Bipolar disorder Maternal Aunt     Family Psychiatric History:Aunt has hx of bipolar disorder. Denies suicide in family.  Social History: Currently is single, lives with son in [place]. Hx of being in prison for 30 days , a year and half ago for assault on ex boyfriend. History        Social History:  History   Alcohol Use  . 0.0 oz/week    Comment: reports she drinks 3 40 ounce beers daily     History  Drug Use No    Comment: last use 08/02/2012    Social History   Social History  . Marital Status: Single    Spouse Name: N/A  . Number of Children: N/A  . Years of Education: N/A   Social History Main Topics  . Smoking status: Current Every Day Smoker -- 0.50 packs/day for 15 years    Types: Cigarettes    Last Attempt to Quit: 08/02/2012  . Smokeless tobacco: Never Used     Comment: quit smoking 02/2012  . Alcohol Use: 0.0 oz/week     Comment: reports she drinks 3 40 ounce beers daily  . Drug Use: No     Comment: last use 08/02/2012  . Sexual Activity: Not Asked   Other Topics Concern  . None   Social History Narrative   Additional Social History:                         Sleep: Poor  Appetite:  Fair  Current Medications: Current Facility-Administered Medications  Medication Dose Route Frequency Provider Last Rate Last Dose  . acetaminophen (TYLENOL) tablet 650 mg  650 mg Oral Q6H PRN Patrecia Pour, NP      . albuterol (PROVENTIL HFA;VENTOLIN HFA) 108 (90 BASE) MCG/ACT inhaler 2 puff  2 puff Inhalation Q6H PRN Patrecia Pour, NP      . alum & mag hydroxide-simeth (MAALOX/MYLANTA) 200-200-20 MG/5ML suspension 30 mL  30 mL Oral Q4H PRN Patrecia Pour, NP      . amLODipine (NORVASC) tablet 5 mg  5 mg Oral Daily Patrecia Pour, NP   5 mg at 04/05/15 0816  . aspirin chewable tablet 81 mg  81 mg Oral Daily Patrecia Pour, NP   81 mg at 04/05/15 0816  . atenolol (TENORMIN) tablet 100 mg  100 mg Oral Daily Patrecia Pour, NP   100 mg at 04/05/15 0825  . atorvastatin (LIPITOR) tablet 40 mg  40 mg Oral QPM Patrecia Pour, NP   40 mg at 04/04/15 1725  . baclofen (LIORESAL) tablet 10 mg  10 mg Oral TID Patrecia Pour, NP   10 mg at 04/05/15 3570  . haloperidol (HALDOL) tablet 5 mg  5 mg Oral Q6H PRN [name], MD       And  . benztropine (COGENTIN) tablet 1 mg  1 mg  Oral Q6H PRN [name], MD      . calcium carbonate (OS-CAL - dosed in mg of elemental calcium) tablet 500 mg of elemental calcium  1 tablet Oral Q breakfast Patrecia Pour, NP   500 mg of elemental calcium at 04/05/15 0818  . celecoxib (CELEBREX) capsule 100 mg  100 mg Oral BID Asa Saunas  Lord, NP   100 mg at 04/05/15 0818  . chlordiazePOXIDE (LIBRIUM) capsule 25 mg  25 mg Oral Q4H PRN Christophe Rising, MD      . feeding supplement (ENSURE ENLIVE) (ENSURE ENLIVE) liquid 237 mL  237 mL Oral BID BM Loula Marcella, MD   237 mL at 04/05/15 0924  . folic acid (FOLVITE) tablet 1 mg  1 mg Oral Daily Patrecia Pour, NP   1 mg at 04/05/15 0816  . furosemide (LASIX) tablet 40 mg  40 mg Oral Daily Patrecia Pour, NP   40 mg at 04/05/15 0819  . gabapentin (NEURONTIN) capsule 400 mg  400 mg Oral BID Patrecia Pour, NP   400 mg at 04/05/15 0817  . hydrochlorothiazide (HYDRODIURIL) tablet 25 mg  25 mg Oral Daily Patrecia Pour, NP   25 mg at 04/05/15 0819  . hydrOXYzine (ATARAX/VISTARIL) tablet 25 mg  25 mg Oral Q6H PRN Patrecia Pour, NP   25 mg at 04/05/15 0926  . ibuprofen (ADVIL,MOTRIN) tablet 600 mg  600 mg Oral Q8H PRN Patrecia Pour, NP   600 mg at 04/04/15 7026  . insulin aspart (novoLOG) injection 0-15 Units  0-15 Units Subcutaneous TID WC [name], MD   2 Units at 04/05/15 3785  . insulin glargine (LANTUS) injection 30 Units  30 Units Subcutaneous QHS Patrecia Pour, NP   30 Units at 04/04/15 2115  . lamoTRIgine (LAMICTAL) tablet 25 mg  25 mg Oral QPM [name], MD   25 mg at 04/04/15 1725  . linagliptin (TRADJENTA) tablet 5 mg  5 mg Oral Daily Patrecia Pour, NP   5 mg at 04/05/15 0819  . loratadine (CLARITIN) tablet 10 mg  10 mg Oral Daily Patrecia Pour, NP   10 mg at 04/05/15 0819  . magnesium hydroxide (MILK OF MAGNESIA) suspension 30 mL  30 mL Oral Daily PRN Patrecia Pour, NP      . metFORMIN (GLUCOPHAGE) tablet 1,000 mg  1,000 mg Oral BID WC Patrecia Pour, NP   1,000 mg at 04/05/15  0817  . metoCLOPramide (REGLAN) tablet 10 mg  10 mg Oral TID AC & HS Patrecia Pour, NP   10 mg at 04/05/15 0636  . multivitamin with minerals tablet 1 tablet  1 tablet Oral Daily Patrecia Pour, NP   1 tablet at 04/05/15 8850  . naltrexone (DEPADE) tablet 50 mg  50 mg Oral Daily Patrecia Pour, NP   50 mg at 04/05/15 0816  . nicotine (NICODERM CQ - dosed in mg/24 hours) patch 21 mg  21 mg Transdermal Daily Patrecia Pour, NP   21 mg at 04/05/15 0826  . nitrofurantoin (MACRODANTIN) capsule 100 mg  100 mg Oral Q12H [name], MD   100 mg at 04/05/15 0817  . ondansetron (ZOFRAN) tablet 4 mg  4 mg Oral Q8H PRN Patrecia Pour, NP      . Oxcarbazepine (TRILEPTAL) tablet 600 mg  600 mg Oral Daily Patrecia Pour, NP   600 mg at 04/05/15 0820  . pantoprazole (PROTONIX) EC tablet 40 mg  40 mg Oral Daily Patrecia Pour, NP   40 mg at 04/05/15 0817  . potassium chloride SA (K-DUR,KLOR-CON) CR tablet 20 mEq  20 mEq Oral Daily Patrecia Pour, NP   20 mEq at 04/05/15 0818  . QUEtiapine (SEROQUEL XR) 24 hr tablet 500 mg  500 mg Oral Q2000 [name], MD  500 mg at 04/04/15 2115  . thiamine (VITAMIN B-1) tablet 100 mg  100 mg Oral Daily Patrecia Pour, NP   100 mg at 04/05/15 0818  . traZODone (DESYREL) tablet 50 mg  50 mg Oral QHS PRN [name], MD        Lab Results:  Results for orders placed or performed during the hospital encounter of 04/03/15 (from the past 48 hour(s))  Glucose, capillary     Status: Abnormal   Collection Time: 04/03/15 11:41 AM  Result Value Ref Range   Glucose-Capillary 154 (H) 65 - 99 mg/dL  Glucose, capillary     Status: Abnormal   Collection Time: 04/03/15 12:06 PM  Result Value Ref Range   Glucose-Capillary 134 (H) 65 - 99 mg/dL  Glucose, capillary     Status: Abnormal   Collection Time: 04/03/15  5:04 PM  Result Value Ref Range   Glucose-Capillary 154 (H) 65 - 99 mg/dL  Glucose, capillary     Status: Abnormal   Collection Time: 04/03/15  8:42 PM  Result  Value Ref Range   Glucose-Capillary 105 (H) 65 - 99 mg/dL  Glucose, capillary     Status: Abnormal   Collection Time: 04/04/15  6:09 AM  Result Value Ref Range   Glucose-Capillary 106 (H) 65 - 99 mg/dL   Comment 1 Notify RN   Urinalysis with microscopic     Status: Abnormal   Collection Time: 04/04/15  6:11 AM  Result Value Ref Range   Color, Urine YELLOW YELLOW   APPearance CLOUDY (A) CLEAR   Specific Gravity, Urine 1.025 1.005 - 1.030   pH 7.0 5.0 - 8.0   Glucose, UA NEGATIVE NEGATIVE mg/dL   Hgb urine dipstick NEGATIVE NEGATIVE   Bilirubin Urine NEGATIVE NEGATIVE   Ketones, ur NEGATIVE NEGATIVE mg/dL   Protein, ur NEGATIVE NEGATIVE mg/dL   Urobilinogen, UA 0.2 0.0 - 1.0 mg/dL   Nitrite NEGATIVE NEGATIVE   Leukocytes, UA LARGE (A) NEGATIVE   WBC, UA 7-10 <3 WBC/hpf   Bacteria, UA FEW (A) RARE   Squamous Epithelial / LPF RARE RARE    Comment: Performed at [facility]  TSH     Status: None   Collection Time: 04/04/15  6:34 AM  Result Value Ref Range   TSH 1.169 0.350 - 4.500 uIU/mL    Comment: Performed at [facility]  Lipid panel     Status: Abnormal   Collection Time: 04/04/15  6:34 AM  Result Value Ref Range   Cholesterol 153 0 - 200 mg/dL   Triglycerides 164 (H) <150 mg/dL   HDL 47 >40 mg/dL   Total CHOL/HDL Ratio 3.3 RATIO   VLDL 33 0 - 40 mg/dL   LDL Cholesterol 73 0 - 99 mg/dL    Comment:        Total Cholesterol/HDL:CHD Risk Coronary Heart Disease Risk Table                     Men   Women  1/2 Average Risk   3.4   3.3  Average Risk       5.0   4.4  2 X Average Risk   9.6   7.1  3 X Average Risk  23.4   11.0        Use the calculated Patient Ratio above and the CHD Risk Table to determine the patient's CHD Risk.        ATP III CLASSIFICATION (LDL):  <  100     mg/dL   Optimal  100-129  mg/dL   Near or Above                    Optimal  130-159  mg/dL   Borderline  160-189  mg/dL   High  >190     mg/dL   Very  High Performed at [facility]   Prolactin     Status: Abnormal   Collection Time: 04/04/15  6:34 AM  Result Value Ref Range   Prolactin 81.7 (H) 4.8 - 23.3 ng/mL    Comment: (NOTE) Performed At: [facility] [place], [place] [id number] Lindon Romp MD [id number] Performed at [facility]   Hemoglobin A1c     Status: Abnormal   Collection Time: 04/04/15  6:34 AM  Result Value Ref Range   Hgb A1c MFr Bld 6.4 (H) 4.8 - 5.6 %    Comment: (NOTE)         Pre-diabetes: 5.7 - 6.4         Diabetes: >6.4         Glycemic control for adults with diabetes: <7.0    Mean Plasma Glucose 137 mg/dL    Comment: (NOTE) Performed At: [facility] [place], [place] [id number] Lindon Romp MD [id number] Performed at [facility] metabolic panel     Status: Abnormal   Collection Time: 04/04/15  6:34 AM  Result Value Ref Range   Sodium 139 135 - 145 mmol/L   Potassium 3.5 3.5 - 5.1 mmol/L   Chloride 99 (L) 101 - 111 mmol/L   CO2 29 22 - 32 mmol/L   Glucose, Bld 146 (H) 65 - 99 mg/dL   BUN 16 6 - 20 mg/dL   Creatinine, Ser 0.90 0.44 - 1.00 mg/dL   Calcium 9.7 8.9 - 10.3 mg/dL   GFR calc non Af Amer >60 >60 mL/min   GFR calc Af Amer >60 >60 mL/min    Comment: (NOTE) The eGFR has been calculated using the CKD EPI equation. This calculation has not been validated in all clinical situations. eGFR's persistently <60 mL/min signify possible Chronic Kidney Disease.    Anion gap 11 5 - 15    Comment: Performed at [facility]  Glucose, capillary     Status: Abnormal   Collection Time: 04/04/15 12:09 PM  Result Value Ref Range   Glucose-Capillary 142 (H) 65 - 99 mg/dL  Glucose, capillary     Status: Abnormal   Collection Time: 04/04/15  4:48 PM  Result Value Ref Range   Glucose-Capillary 150 (H) 65 - 99 mg/dL  Glucose, capillary     Status: Abnormal   Collection  Time: 04/04/15  8:32 PM  Result Value Ref Range   Glucose-Capillary 139 (H) 65 - 99 mg/dL  Glucose, capillary     Status: Abnormal   Collection Time: 04/05/15  6:23 AM  Result Value Ref Range   Glucose-Capillary 134 (H) 65 - 99 mg/dL    Physical Findings: AIMS: Facial and Oral Movements Muscles of Facial Expression: None, normal Lips and Perioral Area: None, normal Jaw: None, normal Tongue: None, normal,Extremity Movements Upper (arms, wrists, hands, fingers): None, normal Lower (legs, knees, ankles, toes): None, normal, Trunk Movements Neck, shoulders, hips: None, normal, Overall Severity Severity of abnormal movements (highest score from questions above): None, normal Incapacitation due to abnormal movements: None, normal Patient's awareness of  abnormal movements (rate only patient's report): No Awareness, Dental Status Current problems with teeth and/or dentures?: No Does patient usually wear dentures?: No  CIWA:  CIWA-Ar Total: 3 COWS:     Musculoskeletal: Strength & Muscle Tone: within normal limits Gait & Station: normal Patient leans: N/A  Psychiatric Specialty Exam: Review of Systems  Neurological: Positive for tremors.  Psychiatric/Behavioral: Positive for depression, hallucinations and substance abuse. The patient is nervous/anxious and has insomnia.   All other systems reviewed and are negative.   Blood pressure 121/80, pulse 102, temperature 98.5 F (36.9 C), temperature source Oral, resp. rate 18, height '5\' 5"'  (1.651 m), weight 105.688 kg (233 lb).Body mass index is 38.77 kg/(m^2).  General Appearance: Fairly Groomed  Engineer, water::  Fair  Speech:  Clear and Coherent  Volume:  Decreased  Mood:  Anxious and Depressed  Affect:  Congruent  Thought Process:  Linear  Orientation:  Full (Time, Place, and Person)  Thought Content:  Delusions, Hallucinations: Auditory Command:  asking her to kill self and others, Paranoid Ideation and Rumination improving   Suicidal Thoughts:  No   Homicidal Thoughts:  No  Memory:  Immediate;   Fair Recent;   Fair Remote;   Fair  Judgement:  Impaired  Insight:  Shallow  Psychomotor Activity:  Restlessness  Concentration:  Poor  Recall:  [employer] of Knowledge:Fair  Language: Fair  Akathisia:  No    AIMS (if indicated):     Assets:  Communication Skills Desire for Improvement  ADL's:  Intact  Cognition: WNL  Sleep:  Number of Hours: 6.5   Treatment Plan Summary: Daily contact with patient to assess and evaluate symptoms and progress in treatment and Medication management  Will continue Seroquel 500 mg po qhs for psychosis, sleep. Add Trazodone 50 mg po qhs prn for sleep. Will continue Trileptal 600 mg po daily for mood lability. Will continue Lamictal 25 mg po qpm for mood sx. Will restart home medications where needed. Will continue  CIWA /Librium prn for withdrawal sx. Will continue to monitor vitals ,medication compliance and treatment side effects while patient is here.  Will monitor for medical issues as well as call consult as needed.  Will also continue Nitrofurantoin 100 mg po bid for UTI - pt is symptomatic.. CSW will start working on disposition. Pt to be referred to substance abuse program,. Patient to participate in therapeutic milieu .       Persephone Schriever MD 04/05/2015, 10:03 AM

## 2015-04-06 LAB — URINE CULTURE: Special Requests: NORMAL

## 2015-04-06 LAB — GLUCOSE, CAPILLARY
GLUCOSE-CAPILLARY: 181 mg/dL — AB (ref 65–99)
GLUCOSE-CAPILLARY: 173 mg/dL — AB (ref 65–99)
Glucose-Capillary: 171 mg/dL — ABNORMAL HIGH (ref 65–99)
Glucose-Capillary: 133 mg/dL — ABNORMAL HIGH (ref 65–99)

## 2015-04-06 MED ORDER — TRAZODONE HCL 100 MG PO TABS
100.0000 mg | ORAL_TABLET | Freq: Every evening | ORAL | Status: DC | PRN
Start: 2015-04-06 — End: 2015-04-10
  Administered 2015-04-07 – 2015-04-09 (×3): 100 mg via ORAL
  Filled 2015-04-06 (×3): qty 1

## 2015-04-06 MED ORDER — QUETIAPINE FUMARATE ER 300 MG PO TB24
600.0000 mg | ORAL_TABLET | Freq: Every day | ORAL | Status: DC
  Administered 2015-04-06 – 2015-04-08 (×3): 600 mg via ORAL
  Filled 2015-04-06 (×4): qty 2

## 2015-04-06 MED ORDER — GLUCERNA SHAKE PO LIQD
237.0000 mL | Freq: Three times a day (TID) | ORAL | Status: DC
  Administered 2015-04-06 – 2015-04-11 (×15): 237 mL via ORAL

## 2015-04-06 NOTE — Progress Notes (Signed)
DAR NOTE: Patient presents with anxious affect and depressed mood.  Denies visual hallucinations.  Rates depression, hopelessness, and anxiety at 9/10.  Maintained on routine safety checks.  Medications given as prescribed.  Support and encouragement offered as needed.  Attended group and participated.  Patient observed socializing with peers in the dayroom.  Patient requested and received Vistaril for complain of anxiety with good effect.  No s/s of hypo/hyperglycemia noted.

## 2015-04-06 NOTE — Tx Team (Signed)
Interdisciplinary Treatment Plan Update (Adult) Date: 04/06/2015    Time Reviewed: 9:30 AM  Progress in Treatment: Attending groups: Continuing to assess, patient new to milieu Participating in groups: Continuing to assess, patient new to milieu Taking medication as prescribed: Yes Tolerating medication: Yes Family/Significant other contact made: No, CSW assessing for appropriate contacts Patient understands diagnosis: Yes Discussing patient identified problems/goals with staff: Yes Medical problems stabilized or resolved: Yes Denies suicidal/homicidal ideation: Yes Issues/concerns per patient self-inventory: Yes Other:  New problem(s) identified: N/A  Discharge Plan or Barriers: See below  Reason for Continuation of Hospitalization:  Depression Anxiety Medication Stabilization   Comments: N/A  Estimated length of stay: 3-5 days   Patient is a 48 year old female admitted for increasing depression, SI, history of Bipolar I Disorder. Patient lives in [place] with her son. Patient will benefit from crisis stabilization, medication evaluation, group therapy, and psycho education in addition to case management for discharge planning. Patient and CSW reviewed pt's identified goals and treatment plan. Pt verbalized understanding and agreed to treatment plan.  Neurontin, Lamictal, Trileptal, Seroquel trial     Review of initial/current patient goals per problem list:  1. Goal(s): Patient will participate in aftercare plan   Met: No   Target date: 3-5 days post admission date   As evidenced by: Patient will participate within aftercare plan AEB aftercare provider and housing plan at discharge being identified. 10/18: Goal not met: CSW assessing for appropriate referrals for pt and will have follow up secured prior to d/c. 10/21:  Pt is putting all her eggs in the [facility] basket.  They will let her know on Monday if she is a candidate.  States she will contact her sister  in the meantime to see if she can stay there.     2. Goal (s): Patient will exhibit decreased depressive symptoms and suicidal ideations.   Met: No   Target date: 3-5 days post admission date   As evidenced by: Patient will utilize self rating of depression at 3 or below and demonstrate decreased signs of depression or be deemed stable for discharge by MD. 10/18: Goal not met: Pt presents with flat affect and depressed mood.  Pt admitted with depression rating of 10.  Pt to show decreased sign of depression and a rating of 3 or less before d/c.   10/21: Continues to c/o high depression.    5. Goal(s): Patient will demonstrate decreased signs of psychosis  * Met: No  * Target date: 3-5 days post admission date  * As evidenced by: Patient will demonstrate decreased frequency of AVH or return to baseline function 10/18: Goal not met: Pt to take medication as prescribed to decrease psychosis to baseline.  10/21:  Continues to c/o AH.      Attendees: Patient:  04/03/2015 9:52 AM   Family:  04/03/2015 9:52 AM   Physician: [name], MD 04/03/2015 9:52 AM   Nursing: Hedy Jacob 04/03/2015 9:52 AM   CSW: Ripley Fraise  04/03/2015 9:52 AM   Other:  04/03/2015 9:52 AM   Other:  04/03/2015 9:52 AM   Other: Lars Pinks, Nurse CM 04/03/2015 9:52 AM   Other: Lucinda Dell, Beverly Sessions TCT 04/03/2015 9:52 AM   Other:  04/03/2015 9:52 AM   Other:  04/03/2015 9:52 AM                   Scribe for Treatment Team:  Tilden Fossa, MSW, [employer] [phone]

## 2015-04-06 NOTE — BHH Group Notes (Signed)
[facility] LCSW Group Therapy  04/06/2015  1:05 PM  Type of Therapy:  Group therapy  Participation Level:  Active  Participation Quality:  Attentive  Affect:  Flat  Cognitive:  Oriented  Insight:  Limited  Engagement in Therapy:  Limited  Modes of Intervention:  Discussion, Socialization  Summary of Progress/Problems:  Chaplain was here to lead a group on themes of hope and courage. "When I think of courage, I..umm... Can't say right now.  I want to be able to stand out on my own; not having people tell me to do this or that.  I need help with medications for anxiety.  Whenever I come here, I find it helpful because it is calm, and I thank this place for that.  I always feel better when i leave than when I came in." [place] B 04/06/2015 1:30 PM

## 2015-04-06 NOTE — Progress Notes (Signed)
[facility] MD Progress Note  04/06/2015 12:20 PM Maureen Swanson  MRN:  [id number] Subjective: patient states " I still have AH . I also felt like hurting myself this AM , I also felt homicidal this AM."  Objective:Maureen Swanson is a 49 y.o. AA female with history of schizoaffective disorder , lives with her son in [place] , presented with worseing depression and anxiety for the past 2 weeks. Patient per initial notes in EHR Stated " hopelessness, fatigue, loss of interest in usual pleasures, and despondence. Patient's symptoms are triggered by loneliness and "Not having anyone around to talk to me". Patient's anxiety is associated with daily panic attacks. She has suicidal thoughts to overdose, run into traffic, and/or cut self. "  Patient seen and chart reviewed.Discussed patient with treatment team.  Pt continues to ruminate on her several psychosocial stressors . Pt continues to have AH , she said its telling her "You are no good." Pt continues to have SI as well as HI - denies plan. Continues to have some anxiety sx as well as tremors from alcohol withdrawal. CIWA reviewed, VS reviewed. Pt continues to be motivated to get help with her substance abuse. Per nursing -pt continues to need a lot of support.      Principal Problem: Schizoaffective disorder, bipolar type ([place]) Diagnosis:   Patient Active Problem List   Diagnosis Date Noted  . Cocaine use disorder, moderate, in early remission [F14.90] 04/03/2015  . Schizoaffective disorder, bipolar type ([place]) [F25.0] 04/03/2015  . Diabetes ([place]) [E11.9] 04/03/2015  . Alcohol use disorder, moderate, dependence ([place]) [F10.20] 04/03/2015  . Conversion disorder [F44.9] 12/09/2014  . Acute ischemic stroke ([place]) [I63.9] 12/08/2014  . Left-sided weakness [M62.89] 12/08/2014  . 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 Knee pain [M25.569] 10/17/2010  . HYPERGLYCEMIA [R73.09] 08/16/2010  . MAMMOGRAM, ABNORMAL, RIGHT [R92.8] 08/05/2010  .  HYPERLIPIDEMIA [E78.5] 06/21/2010  . AMENORRHEA, SECONDARY [N91.2] 10/29/2009  . 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 HELICOBACTER PYLORI INFECTION [A04.8] 04/02/2009  . GERD [K21.9] 10/11/2007  . Essential hypertension [I10] 02/26/2007   Total Time spent with patient: 25 minutes  Past Psychiatric History: Patient with several admissions to [facility] as well as [facility]. Pt with hx of suicide attempts in the past x3, OD as well as cutting self  Past Medical History:  Past Medical History  Diagnosis Date  . High cholesterol   . Depression   . Gout   . Anxiety   . Bipolar 1 disorder ([place])   . GERD (gastroesophageal reflux disease)   . Substance abuse     crack cocaine  . Headache(784.0)     migraines  . TMJ (temporomandibular joint disorder)   . Asthma     daily and prn inhalers  . Arthritis     back and knees  . Gastric ulcer   . 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 Hypertension     under control, has been on med. x 12 yrs.  . Diabetes mellitus     diet-controlled    Past Surgical History  Procedure Laterality Date  . Knee arthroscopy w/ partial medial meniscectomy  05/01/2010    right  . Breast lumpectomy with needle localization  04/19/2012    Procedure: BREAST LUMPECTOMY WITH NEEDLE LOCALIZATION;  Surgeon: [name], MD;  Location: [place];  Service: General;  Laterality: Right;   Family History:  Family History  Problem Relation Age of  Onset  . Diabetes Mother   . Breast cancer Mother   . Cancer Father   . Bipolar disorder Maternal Aunt     Family Psychiatric History:Aunt has hx of bipolar disorder. Denies suicide in family.  Social History: Currently is single, lives with son in [place]. Hx of being in prison for 30 days , a year and half ago for assault on ex boyfriend. History        Social History:  History  Alcohol Use  . 0.0 oz/week    Comment: reports she drinks 3 40 ounce beers daily     History   Drug Use No    Comment: last use 08/02/2012    Social History   Social History  . Marital Status: Single    Spouse Name: N/A  . Number of Children: N/A  . Years of Education: N/A   Social History Main Topics  . Smoking status: Current Every Day Smoker -- 0.50 packs/day for 15 years    Types: Cigarettes    Last Attempt to Quit: 08/02/2012  . Smokeless tobacco: Never Used     Comment: quit smoking 02/2012  . Alcohol Use: 0.0 oz/week     Comment: reports she drinks 3 40 ounce beers daily  . Drug Use: No     Comment: last use 08/02/2012  . Sexual Activity: Not Asked   Other Topics Concern  . None   Social History Narrative   Additional Social History:                         Sleep: Poor  Appetite:  Fair  Current Medications: Current Facility-Administered Medications  Medication Dose Route Frequency Provider Last Rate Last Dose  . acetaminophen (TYLENOL) tablet 650 mg  650 mg Oral Q6H PRN Patrecia Pour, NP      . albuterol (PROVENTIL HFA;VENTOLIN HFA) 108 (90 BASE) MCG/ACT inhaler 2 puff  2 puff Inhalation Q6H PRN Patrecia Pour, NP      . alum & mag hydroxide-simeth (MAALOX/MYLANTA) 200-200-20 MG/5ML suspension 30 mL  30 mL Oral Q4H PRN Patrecia Pour, NP      . amLODipine (NORVASC) tablet 5 mg  5 mg Oral Daily Patrecia Pour, NP   5 mg at 04/06/15 2751  . aspirin chewable tablet 81 mg  81 mg Oral Daily Patrecia Pour, NP   81 mg at 04/06/15 7001  . atenolol (TENORMIN) tablet 100 mg  100 mg Oral Daily Patrecia Pour, NP   100 mg at 04/06/15 0808  . atorvastatin (LIPITOR) tablet 40 mg  40 mg Oral QPM Patrecia Pour, NP   40 mg at 04/05/15 1810  . baclofen (LIORESAL) tablet 10 mg  10 mg Oral TID Patrecia Pour, NP   10 mg at 04/06/15 1148  . haloperidol (HALDOL) tablet 5 mg  5 mg Oral Q6H PRN [name], MD       And  . benztropine (COGENTIN) tablet 1 mg  1 mg Oral Q6H PRN [name], MD      . calcium carbonate (OS-CAL - dosed in mg of elemental calcium)  tablet 500 mg of elemental calcium  1 tablet Oral Q breakfast Patrecia Pour, NP   500 mg of elemental calcium at 04/06/15 0814  . celecoxib (CELEBREX) capsule 100 mg  100 mg Oral BID Patrecia Pour, NP   100 mg at 04/06/15 7494  . chlordiazePOXIDE (LIBRIUM) capsule 25 mg  25 mg Oral Q4H PRN [name], MD      . feeding supplement (GLUCERNA SHAKE) (GLUCERNA SHAKE) liquid 237 mL  237 mL Oral TID BM Rashay Barnette, MD   237 mL at 04/06/15 1151  . folic acid (FOLVITE) tablet 1 mg  1 mg Oral Daily Patrecia Pour, NP   1 mg at 04/06/15 0809  . furosemide (LASIX) tablet 40 mg  40 mg Oral Daily Patrecia Pour, NP   40 mg at 04/06/15 9379  . gabapentin (NEURONTIN) capsule 400 mg  400 mg Oral BID Patrecia Pour, NP   400 mg at 04/06/15 0240  . hydrochlorothiazide (HYDRODIURIL) tablet 25 mg  25 mg Oral Daily Patrecia Pour, NP   25 mg at 04/06/15 0814  . hydrOXYzine (ATARAX/VISTARIL) tablet 25 mg  25 mg Oral Q6H PRN Patrecia Pour, NP   25 mg at 04/06/15 0931  . ibuprofen (ADVIL,MOTRIN) tablet 600 mg  600 mg Oral Q8H PRN Patrecia Pour, NP   600 mg at 04/04/15 9735  . insulin aspart (novoLOG) injection 0-15 Units  0-15 Units Subcutaneous TID WC [name], MD   3 Units at 04/06/15 1150  . insulin glargine (LANTUS) injection 30 Units  30 Units Subcutaneous QHS Patrecia Pour, NP   30 Units at 04/05/15 2225  . lamoTRIgine (LAMICTAL) tablet 25 mg  25 mg Oral QPM Eudell Mcphee, MD   25 mg at 04/05/15 1810  . linagliptin (TRADJENTA) tablet 5 mg  5 mg Oral Daily Patrecia Pour, NP   5 mg at 04/06/15 3299  . loratadine (CLARITIN) tablet 10 mg  10 mg Oral Daily Patrecia Pour, NP   10 mg at 04/06/15 2426  . magnesium hydroxide (MILK OF MAGNESIA) suspension 30 mL  30 mL Oral Daily PRN Patrecia Pour, NP      . metFORMIN (GLUCOPHAGE) tablet 1,000 mg  1,000 mg Oral BID WC Patrecia Pour, NP   1,000 mg at 04/06/15 0806  . metoCLOPramide (REGLAN) tablet 10 mg  10 mg Oral TID AC & HS Patrecia Pour, NP   10 mg at  04/06/15 1148  . multivitamin with minerals tablet 1 tablet  1 tablet Oral Daily Patrecia Pour, NP   1 tablet at 04/06/15 8341  . naltrexone (DEPADE) tablet 50 mg  50 mg Oral Daily Patrecia Pour, NP   50 mg at 04/06/15 9622  . nicotine (NICODERM CQ - dosed in mg/24 hours) patch 21 mg  21 mg Transdermal Daily Patrecia Pour, NP   21 mg at 04/06/15 0815  . nitrofurantoin (MACRODANTIN) capsule 100 mg  100 mg Oral Q12H [name], MD   100 mg at 04/06/15 0806  . ondansetron (ZOFRAN) tablet 4 mg  4 mg Oral Q8H PRN Patrecia Pour, NP      . Oxcarbazepine (TRILEPTAL) tablet 600 mg  600 mg Oral Daily Patrecia Pour, NP   600 mg at 04/06/15 0806  . pantoprazole (PROTONIX) EC tablet 40 mg  40 mg Oral Daily Patrecia Pour, NP   40 mg at 04/06/15 2979  . potassium chloride SA (K-DUR,KLOR-CON) CR tablet 20 mEq  20 mEq Oral Daily Patrecia Pour, NP   20 mEq at 04/06/15 8921  . QUEtiapine (SEROQUEL XR) 24 hr tablet 600 mg  600 mg Oral Q2000 Dontee Jaso, MD      . thiamine (VITAMIN B-1) tablet 100 mg  100 mg Oral Daily Asa Saunas  Lord, NP   100 mg at 04/06/15 0807  . traZODone (DESYREL) tablet 100 mg  100 mg Oral QHS PRN [name], MD        Lab Results:  Results for orders placed or performed during the hospital encounter of 04/03/15 (from the past 48 hour(s))  Urine culture     Status: None   Collection Time: 04/04/15  4:05 PM  Result Value Ref Range   Specimen Description      URINE, RANDOM Performed at [facility] Requests      Normal Performed at [place], SUGGEST RECOLLECTION Performed at [facility]    Report Status 04/06/2015 FINAL   Glucose, capillary     Status: Abnormal   Collection Time: 04/04/15  4:48 PM  Result Value Ref Range   Glucose-Capillary 150 (H) 65 - 99 mg/dL  Glucose, capillary     Status: Abnormal   Collection Time: 04/04/15  8:32 PM  Result Value Ref Range    Glucose-Capillary 139 (H) 65 - 99 mg/dL  Glucose, capillary     Status: Abnormal   Collection Time: 04/05/15  6:23 AM  Result Value Ref Range   Glucose-Capillary 134 (H) 65 - 99 mg/dL  Glucose, capillary     Status: Abnormal   Collection Time: 04/05/15 12:04 PM  Result Value Ref Range   Glucose-Capillary 123 (H) 65 - 99 mg/dL  Glucose, capillary     Status: Abnormal   Collection Time: 04/05/15  5:00 PM  Result Value Ref Range   Glucose-Capillary 123 (H) 65 - 99 mg/dL   Comment 1 Notify RN    Comment 2 Document in Chart   Glucose, capillary     Status: Abnormal   Collection Time: 04/05/15  8:04 PM  Result Value Ref Range   Glucose-Capillary 120 (H) 65 - 99 mg/dL  Glucose, capillary     Status: Abnormal   Collection Time: 04/06/15  6:44 AM  Result Value Ref Range   Glucose-Capillary 181 (H) 65 - 99 mg/dL   Comment 1 Notify RN   Glucose, capillary     Status: Abnormal   Collection Time: 04/06/15 11:37 AM  Result Value Ref Range   Glucose-Capillary 171 (H) 65 - 99 mg/dL   Comment 1 Notify RN    Comment 2 Document in Chart     Physical Findings: AIMS: Facial and Oral Movements Muscles of Facial Expression: None, normal Lips and Perioral Area: None, normal Jaw: None, normal Tongue: None, normal,Extremity Movements Upper (arms, wrists, hands, fingers): None, normal Lower (legs, knees, ankles, toes): None, normal, Trunk Movements Neck, shoulders, hips: None, normal, Overall Severity Severity of abnormal movements (highest score from questions above): None, normal Incapacitation due to abnormal movements: None, normal Patient's awareness of abnormal movements (rate only patient's report): No Awareness, Dental Status Current problems with teeth and/or dentures?: No Does patient usually wear dentures?: No  CIWA:  CIWA-Ar Total: 1 COWS:     Musculoskeletal: Strength & Muscle Tone: within normal limits Gait & Station: normal Patient leans: N/A  Psychiatric Specialty  Exam: Review of Systems  Neurological: Positive for tremors.  Psychiatric/Behavioral: Positive for depression, hallucinations and substance abuse. The patient is nervous/anxious and has insomnia.   All other systems reviewed and are negative.   Blood pressure 121/68, pulse 81, temperature 98.2 F (36.8 C), temperature source Oral, resp. rate 20, height 5\' 5"  (  1.651 m), weight 105.688 kg (233 lb).Body mass index is 38.77 kg/(m^2).  General Appearance: Fairly Groomed  Engineer, water::  Fair  Speech:  Clear and Coherent  Volume:  Decreased  Mood:  Anxious and Depressed  Affect:  Congruent  Thought Process:  Linear  Orientation:  Full (Time, Place, and Person)  Thought Content:  Delusions, Hallucinations: Auditory Command:  asking her to kill self and others, Paranoid Ideation and Rumination improving  Suicidal Thoughts:  Yes.  without intent/plan   Homicidal Thoughts:  Yes.  without intent/plan  Memory:  Immediate;   Fair Recent;   Fair Remote;   Fair  Judgement:  Impaired  Insight:  Shallow  Psychomotor Activity:  Restlessness  Concentration:  Poor  Recall:  [employer] of Knowledge:Fair  Language: Fair  Akathisia:  No    AIMS (if indicated):     Assets:  Communication Skills Desire for Improvement  ADL's:  Intact  Cognition: WNL  Sleep:  Number of Hours: 6   Treatment Plan Summary: Daily contact with patient to assess and evaluate symptoms and progress in treatment and Medication management  Will increase Seroquel to 600 mg po qhs for psychosis, sleep. Increase Trazodone to 100 mg po qhs prn for sleep. Will continue Trileptal 600 mg po daily for mood lability. Will continue Lamictal 25 mg po qpm for mood sx. Will restart home medications where needed. Will continue  CIWA /Librium prn for withdrawal sx. Will continue to monitor vitals ,medication compliance and treatment side effects while patient is here.  Will monitor for medical issues as well as call consult as needed.   Will also continue Nitrofurantoin 100 mg po bid for UTI - pt is symptomatic.. CSW will start working on disposition. Pt to be referred to substance abuse program,. Patient to participate in therapeutic milieu .       Mihika Surrette MD 04/06/2015, 12:20 PM

## 2015-04-07 LAB — GLUCOSE, CAPILLARY
GLUCOSE-CAPILLARY: 155 mg/dL — AB (ref 65–99)
GLUCOSE-CAPILLARY: 189 mg/dL — AB (ref 65–99)
Glucose-Capillary: 171 mg/dL — ABNORMAL HIGH (ref 65–99)
Glucose-Capillary: 211 mg/dL — ABNORMAL HIGH (ref 65–99)

## 2015-04-07 MED ORDER — LAMOTRIGINE 25 MG PO TABS
25.0000 mg | ORAL_TABLET | Freq: Two times a day (BID) | ORAL | Status: DC
  Administered 2015-04-07 – 2015-04-11 (×8): 25 mg via ORAL
  Filled 2015-04-07 (×2): qty 1
  Filled 2015-04-07: qty 14
  Filled 2015-04-07 (×7): qty 1
  Filled 2015-04-07: qty 14
  Filled 2015-04-07 (×2): qty 1

## 2015-04-07 NOTE — [facility] Group Notes (Signed)
[facility] Group Notes: (Clinical Social Work)   04/07/2015      Type of Therapy:  Group Therapy   Participation Level:  Did Not Attend despite MHT prompting - she did arrive with about 10 minutes left in group, listened   Selmer Dominion, [place] 04/07/2015, 12:35 PM

## 2015-04-07 NOTE — Progress Notes (Signed)
D: Pt is alert and oriented x 4. Pt complained of severe depression of 10 on a 0-10 depression scale with some mild anxiety. She states, "I just finished crying; my family just left and I feel very depressed that they have to come here to see me; I am also a little anxious." Pt denies SI/HI/AVH and pain at the time of assessment however, reports some command AVH to hurt self and others from earlier in the day. Pt was verbally able to contract for safety. Pt was calm and cooperative through the assessment.    A: Pt was encouraged to attend group. Medications administered as prescribed.  Support, encouragement, and safe environment provided.  15-minute safety checks continue.  R: Pt was med compliant.  Pt attended wrap-up group. Safety checks continue.

## 2015-04-07 NOTE — Progress Notes (Signed)
[facility] MD Progress Note  04/07/2015 1:07 PM [name]  MRN:  [id number] Subjective: I think medicine helping me.  However I still hear voices.    Objective:Maureen Swanson is a 49 y.o. AA female with history of schizoaffective disorder , lives with her son in [place] , presented with worseing depression and anxiety for the past 2 weeks. Patient per initial notes in EHR Stated " hopelessness, fatigue, loss of interest in usual pleasures, and despondence. Patient's symptoms are triggered by loneliness and "Not having anyone around to talk to me". Patient's anxiety is associated with daily panic attacks. She has suicidal thoughts to overdose, run into traffic, and/or cut self. "  Patient seen and chart reviewed.  Patient mentioned some improvement with the medication but she is still hearing voices and paranoia.  She is upset because her son refused to take her back.  Patient is not sure where she will stay upon discharge.  She admitted noncompliance with medication causing increased depression, poor sleep, having thoughts of hurting herself.  She told her son is very worried about safety.  Patient has a grandchild and her son does not want to be there because of safety.  Patient has at least 2 history of suicidal attempt.  Patient denies any side effects of medication.  She has no rash or itching with the Lamictal.    Principal Problem: Schizoaffective disorder, bipolar type ([place]) Diagnosis:   Patient Active Problem List   Diagnosis Date Noted  . Cocaine use disorder, moderate, in early remission [F14.90] 04/03/2015  . Schizoaffective disorder, bipolar type ([place]) [F25.0] 04/03/2015  . Diabetes ([employer]) [E11.9] 04/03/2015  . Alcohol use disorder, moderate, dependence ([place]) [F10.20] 04/03/2015  . Conversion disorder [F44.9] 12/09/2014  . Acute ischemic stroke ([place]) [I63.9] 12/08/2014  . Left-sided weakness [M62.89] 12/08/2014  . 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 Knee pain [M25.569]  10/17/2010  . HYPERGLYCEMIA [R73.09] 08/16/2010  . MAMMOGRAM, ABNORMAL, RIGHT [R92.8] 08/05/2010  . HYPERLIPIDEMIA [E78.5] 06/21/2010  . AMENORRHEA, SECONDARY [N91.2] 10/29/2009  . 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 HELICOBACTER PYLORI INFECTION [A04.8] 04/02/2009  . GERD [K21.9] 10/11/2007  . Essential hypertension [I10] 02/26/2007   Total Time spent with patient: 25 minutes  Past Psychiatric History: Patient with several admissions to [facility] as well as [facility]. Pt with hx of suicide attempts in the past x3, OD as well as cutting self  Past Medical History:  Past Medical History  Diagnosis Date  . High cholesterol   . Depression   . Gout   . Anxiety   . Bipolar 1 disorder ([place])   . GERD (gastroesophageal reflux disease)   . Substance abuse     crack cocaine  . Headache(784.0)     migraines  . TMJ (temporomandibular joint disorder)   . Asthma     daily and prn inhalers  . Arthritis     back and knees  . Gastric ulcer   . 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 Hypertension     under control, has been on med. x 12 yrs.  . Diabetes mellitus     diet-controlled    Past Surgical History  Procedure Laterality Date  . Knee arthroscopy w/ partial medial meniscectomy  05/01/2010    right  . Breast lumpectomy with needle localization  04/19/2012    Procedure: BREAST LUMPECTOMY WITH NEEDLE LOCALIZATION;  Surgeon: [name], MD;  Location: [facility];  Service: General;  Laterality: Right;   Family History:  Family History  Problem Relation Age of Onset  . Diabetes Mother   . Breast cancer Mother   . Cancer Father   . Bipolar disorder Maternal Aunt     Family Psychiatric History:Aunt has hx of bipolar disorder. Denies suicide in family.  Social History: Currently is single, lives with son in [place]. Hx of being in prison for 30 days , a year and half ago for assault on ex boyfriend. History        Social History:  History   Alcohol Use  . 0.0 oz/week    Comment: reports she drinks 3 40 ounce beers daily     History  Drug Use No    Comment: last use 08/02/2012    Social History   Social History  . Marital Status: Single    Spouse Name: N/A  . Number of Children: N/A  . Years of Education: N/A   Social History Main Topics  . Smoking status: Current Every Day Smoker -- 0.50 packs/day for 15 years    Types: Cigarettes    Last Attempt to Quit: 08/02/2012  . Smokeless tobacco: Never Used     Comment: quit smoking 02/2012  . Alcohol Use: 0.0 oz/week     Comment: reports she drinks 3 40 ounce beers daily  . Drug Use: No     Comment: last use 08/02/2012  . Sexual Activity: Not Asked   Other Topics Concern  . None   Social History Narrative   Additional Social History:   Sleep: Poor  Appetite:  Fair  Current Medications: Current Facility-Administered Medications  Medication Dose Route Frequency Provider Last Rate Last Dose  . acetaminophen (TYLENOL) tablet 650 mg  650 mg Oral Q6H PRN Patrecia Pour, NP      . albuterol (PROVENTIL HFA;VENTOLIN HFA) 108 (90 BASE) MCG/ACT inhaler 2 puff  2 puff Inhalation Q6H PRN Patrecia Pour, NP   2 puff at 04/07/15 [phone]  . alum & mag hydroxide-simeth (MAALOX/MYLANTA) 200-200-20 MG/5ML suspension 30 mL  30 mL Oral Q4H PRN Patrecia Pour, NP      . amLODipine (NORVASC) tablet 5 mg  5 mg Oral Daily Patrecia Pour, NP   5 mg at 04/07/15 0920  . aspirin chewable tablet 81 mg  81 mg Oral Daily Patrecia Pour, NP   81 mg at 04/07/15 0919  . atenolol (TENORMIN) tablet 100 mg  100 mg Oral Daily Patrecia Pour, NP   100 mg at 04/07/15 0920  . atorvastatin (LIPITOR) tablet 40 mg  40 mg Oral QPM Patrecia Pour, NP   40 mg at 04/06/15 1833  . baclofen (LIORESAL) tablet 10 mg  10 mg Oral TID Patrecia Pour, NP   10 mg at 04/07/15 1225  . haloperidol (HALDOL) tablet 5 mg  5 mg Oral Q6H PRN [name], MD       And  . benztropine (COGENTIN) tablet 1 mg  1 mg Oral Q6H PRN  [name], MD      . calcium carbonate (OS-CAL - dosed in mg of elemental calcium) tablet 500 mg of elemental calcium  1 tablet Oral Q breakfast Patrecia Pour, NP   500 mg of elemental calcium at 04/07/15 0919  . celecoxib (CELEBREX) capsule 100 mg  100 mg Oral BID Patrecia Pour, NP   100 mg at 04/07/15 1228  . chlordiazePOXIDE (LIBRIUM) capsule 25 mg  25 mg  Oral Q4H PRN [name], MD      . feeding supplement (GLUCERNA SHAKE) (GLUCERNA SHAKE) liquid 237 mL  237 mL Oral TID BM Saramma Eappen, MD   237 mL at 04/07/15 1000  . folic acid (FOLVITE) tablet 1 mg  1 mg Oral Daily Patrecia Pour, NP   1 mg at 04/07/15 8453  . furosemide (LASIX) tablet 40 mg  40 mg Oral Daily Patrecia Pour, NP   40 mg at 04/07/15 6468  . gabapentin (NEURONTIN) capsule 400 mg  400 mg Oral BID Patrecia Pour, NP   400 mg at 04/07/15 0321  . hydrochlorothiazide (HYDRODIURIL) tablet 25 mg  25 mg Oral Daily Patrecia Pour, NP   25 mg at 04/07/15 2248  . hydrOXYzine (ATARAX/VISTARIL) tablet 25 mg  25 mg Oral Q6H PRN Patrecia Pour, NP   25 mg at 04/06/15 2152  . ibuprofen (ADVIL,MOTRIN) tablet 600 mg  600 mg Oral Q8H PRN Patrecia Pour, NP   600 mg at 04/04/15 2500  . insulin aspart (novoLOG) injection 0-15 Units  0-15 Units Subcutaneous TID WC [name], MD   3 Units at 04/07/15 1226  . insulin glargine (LANTUS) injection 30 Units  30 Units Subcutaneous QHS Patrecia Pour, NP   30 Units at 04/06/15 2150  . lamoTRIgine (LAMICTAL) tablet 25 mg  25 mg Oral QPM [name], MD   25 mg at 04/06/15 1832  . linagliptin (TRADJENTA) tablet 5 mg  5 mg Oral Daily Patrecia Pour, NP   5 mg at 04/07/15 3704  . loratadine (CLARITIN) tablet 10 mg  10 mg Oral Daily Patrecia Pour, NP   10 mg at 04/07/15 0920  . magnesium hydroxide (MILK OF MAGNESIA) suspension 30 mL  30 mL Oral Daily PRN Patrecia Pour, NP      . metFORMIN (GLUCOPHAGE) tablet 1,000 mg  1,000 mg Oral BID WC Patrecia Pour, NP   1,000 mg at 04/07/15 8889  .  metoCLOPramide (REGLAN) tablet 10 mg  10 mg Oral TID AC & HS Patrecia Pour, NP   10 mg at 04/07/15 1694  . multivitamin with minerals tablet 1 tablet  1 tablet Oral Daily Patrecia Pour, NP   1 tablet at 04/07/15 5038  . naltrexone (DEPADE) tablet 50 mg  50 mg Oral Daily Patrecia Pour, NP   50 mg at 04/07/15 0919  . nicotine (NICODERM CQ - dosed in mg/24 hours) patch 21 mg  21 mg Transdermal Daily Patrecia Pour, NP   21 mg at 04/07/15 0800  . nitrofurantoin (MACRODANTIN) capsule 100 mg  100 mg Oral Q12H Saramma Eappen, MD   100 mg at 04/07/15 0919  . ondansetron (ZOFRAN) tablet 4 mg  4 mg Oral Q8H PRN Patrecia Pour, NP      . Oxcarbazepine (TRILEPTAL) tablet 600 mg  600 mg Oral Daily Patrecia Pour, NP   600 mg at 04/07/15 8828  . pantoprazole (PROTONIX) EC tablet 40 mg  40 mg Oral Daily Patrecia Pour, NP   40 mg at 04/07/15 0919  . potassium chloride SA (K-DUR,KLOR-CON) CR tablet 20 mEq  20 mEq Oral Daily Patrecia Pour, NP   20 mEq at 04/07/15 0034  . QUEtiapine (SEROQUEL XR) 24 hr tablet 600 mg  600 mg Oral Q2000 Saramma Eappen, MD   600 mg at 04/06/15 1958  . thiamine (VITAMIN B-1) tablet 100 mg  100 mg Oral Daily Theodoro Clock  Leander Rams, NP   100 mg at 04/07/15 0917  . traZODone (DESYREL) tablet 100 mg  100 mg Oral QHS PRN [name], MD        Lab Results:  Results for orders placed or performed during the hospital encounter of 04/03/15 (from the past 48 hour(s))  Glucose, capillary     Status: Abnormal   Collection Time: 04/05/15  5:00 PM  Result Value Ref Range   Glucose-Capillary 123 (H) 65 - 99 mg/dL   Comment 1 Notify RN    Comment 2 Document in Chart   Glucose, capillary     Status: Abnormal   Collection Time: 04/05/15  8:04 PM  Result Value Ref Range   Glucose-Capillary 120 (H) 65 - 99 mg/dL  Glucose, capillary     Status: Abnormal   Collection Time: 04/06/15  6:44 AM  Result Value Ref Range   Glucose-Capillary 181 (H) 65 - 99 mg/dL   Comment 1 Notify RN   Glucose,  capillary     Status: Abnormal   Collection Time: 04/06/15 11:37 AM  Result Value Ref Range   Glucose-Capillary 171 (H) 65 - 99 mg/dL   Comment 1 Notify RN    Comment 2 Document in Chart   Glucose, capillary     Status: Abnormal   Collection Time: 04/06/15  4:44 PM  Result Value Ref Range   Glucose-Capillary 173 (H) 65 - 99 mg/dL  Glucose, capillary     Status: Abnormal   Collection Time: 04/06/15  9:06 PM  Result Value Ref Range   Glucose-Capillary 133 (H) 65 - 99 mg/dL  Glucose, capillary     Status: Abnormal   Collection Time: 04/07/15  6:31 AM  Result Value Ref Range   Glucose-Capillary 155 (H) 65 - 99 mg/dL   Comment 1 Notify RN   Glucose, capillary     Status: Abnormal   Collection Time: 04/07/15 11:45 AM  Result Value Ref Range   Glucose-Capillary 171 (H) 65 - 99 mg/dL   Comment 1 Document in Chart     Physical Findings: AIMS: Facial and Oral Movements Muscles of Facial Expression: None, normal Lips and Perioral Area: None, normal Jaw: None, normal Tongue: None, normal,Extremity Movements Upper (arms, wrists, hands, fingers): None, normal Lower (legs, knees, ankles, toes): None, normal, Trunk Movements Neck, shoulders, hips: None, normal, Overall Severity Severity of abnormal movements (highest score from questions above): None, normal Incapacitation due to abnormal movements: None, normal Patient's awareness of abnormal movements (rate only patient's report): No Awareness, Dental Status Current problems with teeth and/or dentures?: No Does patient usually wear dentures?: No  CIWA:  CIWA-Ar Total: 1 COWS:     Musculoskeletal: Strength & Muscle Tone: within normal limits Gait & Station: normal Patient leans: N/A  Psychiatric Specialty Exam: Review of Systems  Skin: Negative for itching and rash.  Neurological: Negative for headaches.  Psychiatric/Behavioral: Positive for depression, hallucinations and substance abuse.  All other systems reviewed and are  negative.   Blood pressure 116/83, pulse 90, temperature 98.4 F (36.9 C), temperature source Oral, resp. rate 20, height 5\' 5"  (1.651 m), weight 105.688 kg (233 lb).Body mass index is 38.77 kg/(m^2).  General Appearance: Fairly Groomed  Engineer, water::  Fair  Speech:  Clear and Coherent  Volume:  Decreased  Mood:  Anxious and Depressed  Affect:  Congruent  Thought Process:  Linear  Orientation:  Full (Time, Place, and Person)  Thought Content:  Hallucinations: Auditory Command:  asking her to kill self  and others, Paranoid Ideation and Rumination improving  Suicidal Thoughts:  Yes.  without intent/plan   Homicidal Thoughts:  Yes.  without intent/plan  Memory:  Immediate;   Fair Recent;   Fair Remote;   Fair  Judgement:  Impaired  Insight:  Shallow  Psychomotor Activity:  Restlessness  Concentration:  Poor  Recall:  [place] of Knowledge:Fair  Language: Fair  Akathisia:  No    AIMS (if indicated):     Assets:  Communication Skills Desire for Improvement  ADL's:  Intact  Cognition: WNL  Sleep:  Number of Hours: 5.75   Treatment Plan Summary: Daily contact with patient to assess and evaluate symptoms and progress in treatment and Medication management  Continue Seroquel to 600 mg po qhs for psychosis, sleep. Continue Trazodone to 100 mg po qhs prn for sleep. Will continue Trileptal 600 mg po daily for mood lability. Increase Lamictal 25 mg twice a day.   Will restart home medications where needed. Will continue  CIWA /Librium prn for withdrawal sx. Will continue to monitor vitals ,medication compliance and treatment side effects while patient is here.  Will monitor for medical issues as well as call consult as needed.  Will also continue Nitrofurantoin 100 mg po bid for UTI - pt is symptomatic.. CSW will start working on disposition. Pt to be referred to substance abuse program,. Patient to participate in therapeutic milieu .       Hawraa Stambaugh T.  MD 04/07/2015, 1:07 PM

## 2015-04-07 NOTE — Progress Notes (Signed)
D Maureen Swanson is stabile. Flat. Depressed and quiet, She takes her scheduled meds as ordered and she attends her groups. She offers little about how she is feeling today and voices no compliants. She did request a prn vistaril at 1315 and was given this per her request. She stated " its better" one hour later.    A She completed her daily assessment and on it she wrote she deneid SI and she rated her depression, hopelessness and anxiety " 9/9/9/", respectively.She told Dr. Adele Swanson that she cont to experience auditory hallucinations that worry her . She did not mention this to this Probation officer.   R Safety is in place and poc cont.

## 2015-04-08 LAB — GLUCOSE, CAPILLARY
GLUCOSE-CAPILLARY: 112 mg/dL — AB (ref 65–99)
GLUCOSE-CAPILLARY: 164 mg/dL — AB (ref 65–99)

## 2015-04-08 NOTE — Progress Notes (Signed)
Patient ID: Maureen Swanson, female   DOB: 12-05-1965, 49 y.o.   MRN: [id number]   D: Pt has been very flat and depressed on the unit today, she has also been very isolative. Pt did not attend any groups nor did she engage in any treatment. Pt refused to fill out her self inventory sheet, patient reported that she did not feel good. Pt took all medication as prescribed by the doctor, no other issues or concerns noted. Pt reported being negative SI/HI, no AH/VH noted. A: 15 min checks continued for patient safety. R: Pt safety maintained.

## 2015-04-08 NOTE — Progress Notes (Signed)
[facility] MD Progress Note  04/08/2015 1:40 PM Maureen Swanson  MRN:  [id number] Subjective: I am sleeping better.    Objective:Maureen Swanson is a 49 y.o. AA female with history of schizoaffective disorder , lives with her son in [place] , presented with worseing depression and anxiety for the past 2 weeks. Patient per initial notes in EHR Stated " hopelessness, fatigue, loss of interest in usual pleasures, and despondence. Patient's symptoms are triggered by loneliness and "Not having anyone around to talk to me". Patient's anxiety is associated with daily panic attacks. She has suicidal thoughts to overdose, run into traffic, and/or cut self. "  Patient seen and chart reviewed.  She is taking her medication and feeling somewhat better.  She is going to the groups.  Her sleep is improved from the past.  Patient is still endorse hallucination, paranoia but voices are less intense and less frequent from the past.  She is very disappointed because her son did not call her back.  She admitted some time she feels hopeless helpless and continued to have thoughts of hurting herself but she is hoping that medicine is going to help her.  She has no tremors, shakes or any side effects.  Her appetite is okay.  She has no rash or itching with the Lamictal.  Principal Problem: Schizoaffective disorder, bipolar type ([place]) Diagnosis:   Patient Active Problem List   Diagnosis Date Noted  . Cocaine use disorder, moderate, in early remission [F14.90] 04/03/2015  . Schizoaffective disorder, bipolar type ([place]) [F25.0] 04/03/2015  . Diabetes ([place]) [E11.9] 04/03/2015  . Alcohol use disorder, moderate, dependence ([place]) [F10.20] 04/03/2015  . Conversion disorder [F44.9] 12/09/2014  . Acute ischemic stroke ([place]) [I63.9] 12/08/2014  . Left-sided weakness [M62.89] 12/08/2014  . 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 Knee pain [M25.569] 10/17/2010  . HYPERGLYCEMIA [R73.09] 08/16/2010  . MAMMOGRAM, ABNORMAL, RIGHT  [R92.8] 08/05/2010  . HYPERLIPIDEMIA [E78.5] 06/21/2010  . AMENORRHEA, SECONDARY [N91.2] 10/29/2009  . 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 HELICOBACTER PYLORI INFECTION [A04.8] 04/02/2009  . GERD [K21.9] 10/11/2007  . Essential hypertension [I10] 02/26/2007   Total Time spent with patient: 25 minutes  Past Psychiatric History: Patient with several admissions to [facility] as well as [facility]. Pt with hx of suicide attempts in the past x3, OD as well as cutting self  Past Medical History:  Past Medical History  Diagnosis Date  . High cholesterol   . Depression   . Gout   . Anxiety   . Bipolar 1 disorder ([place])   . GERD (gastroesophageal reflux disease)   . Substance abuse     crack cocaine  . Headache(784.0)     migraines  . TMJ (temporomandibular joint disorder)   . Asthma     daily and prn inhalers  . Arthritis     back and knees  . Gastric ulcer   . 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 Hypertension     under control, has been on med. x 12 yrs.  . Diabetes mellitus     diet-controlled    Past Surgical History  Procedure Laterality Date  . Knee arthroscopy w/ partial medial meniscectomy  05/01/2010    right  . Breast lumpectomy with needle localization  04/19/2012    Procedure: BREAST LUMPECTOMY WITH NEEDLE LOCALIZATION;  Surgeon: [name], MD;  Location: [facility];  Service: General;  Laterality: Right;   Family History:  Family History  Problem Relation Age of Onset  . Diabetes Mother   . Breast cancer Mother   . Cancer Father   . Bipolar disorder Maternal Aunt     Family Psychiatric History:Aunt has hx of bipolar disorder. Denies suicide in family.  Social History: Currently is single, lives with son in [place]. Hx of being in prison for 30 days , a year and half ago for assault on ex boyfriend. History        Social History:  History  Alcohol Use  . 0.0 oz/week    Comment: reports she drinks 3 40 ounce beers  daily     History  Drug Use No    Comment: last use 08/02/2012    Social History   Social History  . Marital Status: Single    Spouse Name: N/A  . Number of Children: N/A  . Years of Education: N/A   Social History Main Topics  . Smoking status: Current Every Day Smoker -- 0.50 packs/day for 15 years    Types: Cigarettes    Last Attempt to Quit: 08/02/2012  . Smokeless tobacco: Never Used     Comment: quit smoking 02/2012  . Alcohol Use: 0.0 oz/week     Comment: reports she drinks 3 40 ounce beers daily  . Drug Use: No     Comment: last use 08/02/2012  . Sexual Activity: Not Asked   Other Topics Concern  . None   Social History Narrative   Additional Social History:   Sleep: Poor  Appetite:  Fair  Current Medications: Current Facility-Administered Medications  Medication Dose Route Frequency Provider Last Rate Last Dose  . acetaminophen (TYLENOL) tablet 650 mg  650 mg Oral Q6H PRN Patrecia Pour, NP   650 mg at 04/08/15 1206  . albuterol (PROVENTIL HFA;VENTOLIN HFA) 108 (90 BASE) MCG/ACT inhaler 2 puff  2 puff Inhalation Q6H PRN Patrecia Pour, NP   2 puff at 04/07/15 [phone]  . alum & mag hydroxide-simeth (MAALOX/MYLANTA) 200-200-20 MG/5ML suspension 30 mL  30 mL Oral Q4H PRN Patrecia Pour, NP      . amLODipine (NORVASC) tablet 5 mg  5 mg Oral Daily Patrecia Pour, NP   5 mg at 04/08/15 1134  . aspirin chewable tablet 81 mg  81 mg Oral Daily Patrecia Pour, NP   81 mg at 04/08/15 1134  . atenolol (TENORMIN) tablet 100 mg  100 mg Oral Daily Patrecia Pour, NP   100 mg at 04/08/15 1134  . atorvastatin (LIPITOR) tablet 40 mg  40 mg Oral QPM Patrecia Pour, NP   40 mg at 04/07/15 1800  . baclofen (LIORESAL) tablet 10 mg  10 mg Oral TID Patrecia Pour, NP   10 mg at 04/08/15 1134  . haloperidol (HALDOL) tablet 5 mg  5 mg Oral Q6H PRN [name], MD       And  . benztropine (COGENTIN) tablet 1 mg  1 mg Oral Q6H PRN [name], MD      . calcium carbonate (OS-CAL -  dosed in mg of elemental calcium) tablet 500 mg of elemental calcium  1 tablet Oral Q breakfast Patrecia Pour, NP   500 mg of elemental calcium at 04/08/15 1135  . celecoxib (CELEBREX) capsule 100 mg  100 mg Oral BID Patrecia Pour, NP   100 mg at 04/08/15 1134  . chlordiazePOXIDE (LIBRIUM) capsule 25 mg  25 mg Oral Q4H PRN [name], MD      .  feeding supplement (GLUCERNA SHAKE) (GLUCERNA SHAKE) liquid 237 mL  237 mL Oral TID BM Saramma Eappen, MD   237 mL at 04/08/15 1135  . folic acid (FOLVITE) tablet 1 mg  1 mg Oral Daily Patrecia Pour, NP   1 mg at 04/08/15 1134  . furosemide (LASIX) tablet 40 mg  40 mg Oral Daily Patrecia Pour, NP   40 mg at 04/08/15 1133  . gabapentin (NEURONTIN) capsule 400 mg  400 mg Oral BID Patrecia Pour, NP   400 mg at 04/08/15 1133  . hydrochlorothiazide (HYDRODIURIL) tablet 25 mg  25 mg Oral Daily Patrecia Pour, NP   25 mg at 04/08/15 1133  . hydrOXYzine (ATARAX/VISTARIL) tablet 25 mg  25 mg Oral Q6H PRN Patrecia Pour, NP   25 mg at 04/08/15 0616  . ibuprofen (ADVIL,MOTRIN) tablet 600 mg  600 mg Oral Q8H PRN Patrecia Pour, NP   600 mg at 04/04/15 6761  . insulin aspart (novoLOG) injection 0-15 Units  0-15 Units Subcutaneous TID WC [name], MD   3 Units at 04/08/15 1206  . insulin glargine (LANTUS) injection 30 Units  30 Units Subcutaneous QHS Patrecia Pour, NP   30 Units at 04/07/15 2122  . lamoTRIgine (LAMICTAL) tablet 25 mg  25 mg Oral BID Kathlee Nations, MD   25 mg at 04/08/15 1133  . linagliptin (TRADJENTA) tablet 5 mg  5 mg Oral Daily Patrecia Pour, NP   5 mg at 04/08/15 1133  . loratadine (CLARITIN) tablet 10 mg  10 mg Oral Daily Patrecia Pour, NP   10 mg at 04/08/15 1133  . magnesium hydroxide (MILK OF MAGNESIA) suspension 30 mL  30 mL Oral Daily PRN Patrecia Pour, NP      . metFORMIN (GLUCOPHAGE) tablet 1,000 mg  1,000 mg Oral BID WC Patrecia Pour, NP   1,000 mg at 04/08/15 1133  . metoCLOPramide (REGLAN) tablet 10 mg  10 mg Oral TID AC &  HS Patrecia Pour, NP   10 mg at 04/08/15 1207  . multivitamin with minerals tablet 1 tablet  1 tablet Oral Daily Patrecia Pour, NP   1 tablet at 04/08/15 1132  . naltrexone (DEPADE) tablet 50 mg  50 mg Oral Daily Patrecia Pour, NP   50 mg at 04/08/15 1133  . nicotine (NICODERM CQ - dosed in mg/24 hours) patch 21 mg  21 mg Transdermal Daily Patrecia Pour, NP   21 mg at 04/08/15 1133  . nitrofurantoin (MACRODANTIN) capsule 100 mg  100 mg Oral Q12H Saramma Eappen, MD   100 mg at 04/08/15 1135  . ondansetron (ZOFRAN) tablet 4 mg  4 mg Oral Q8H PRN Patrecia Pour, NP      . Oxcarbazepine (TRILEPTAL) tablet 600 mg  600 mg Oral Daily Patrecia Pour, NP   600 mg at 04/08/15 1132  . pantoprazole (PROTONIX) EC tablet 40 mg  40 mg Oral Daily Patrecia Pour, NP   40 mg at 04/08/15 1132  . potassium chloride SA (K-DUR,KLOR-CON) CR tablet 20 mEq  20 mEq Oral Daily Patrecia Pour, NP   20 mEq at 04/08/15 1134  . QUEtiapine (SEROQUEL XR) 24 hr tablet 600 mg  600 mg Oral Q2000 [name], MD   600 mg at 04/07/15 2127  . thiamine (VITAMIN B-1) tablet 100 mg  100 mg Oral Daily Patrecia Pour, NP   100 mg at 04/08/15 1132  .  traZODone (DESYREL) tablet 100 mg  100 mg Oral QHS PRN [name], MD   100 mg at 04/07/15 2127    Lab Results:  Results for orders placed or performed during the hospital encounter of 04/03/15 (from the past 48 hour(s))  Glucose, capillary     Status: Abnormal   Collection Time: 04/06/15  4:44 PM  Result Value Ref Range   Glucose-Capillary 173 (H) 65 - 99 mg/dL  Glucose, capillary     Status: Abnormal   Collection Time: 04/06/15  9:06 PM  Result Value Ref Range   Glucose-Capillary 133 (H) 65 - 99 mg/dL  Glucose, capillary     Status: Abnormal   Collection Time: 04/07/15  6:31 AM  Result Value Ref Range   Glucose-Capillary 155 (H) 65 - 99 mg/dL   Comment 1 Notify RN   Glucose, capillary     Status: Abnormal   Collection Time: 04/07/15 11:45 AM  Result Value Ref Range    Glucose-Capillary 171 (H) 65 - 99 mg/dL   Comment 1 Document in Chart   Glucose, capillary     Status: Abnormal   Collection Time: 04/07/15  4:51 PM  Result Value Ref Range   Glucose-Capillary 211 (H) 65 - 99 mg/dL   Comment 1 Document in Chart   Glucose, capillary     Status: Abnormal   Collection Time: 04/07/15  8:47 PM  Result Value Ref Range   Glucose-Capillary 189 (H) 65 - 99 mg/dL   Comment 1 Notify RN   Glucose, capillary     Status: Abnormal   Collection Time: 04/08/15  6:02 AM  Result Value Ref Range   Glucose-Capillary 112 (H) 65 - 99 mg/dL   Comment 1 Notify RN   Glucose, capillary     Status: Abnormal   Collection Time: 04/08/15 11:59 AM  Result Value Ref Range   Glucose-Capillary 164 (H) 65 - 99 mg/dL   Comment 1 Document in Chart     Physical Findings: AIMS: Facial and Oral Movements Muscles of Facial Expression: None, normal Lips and Perioral Area: None, normal Jaw: None, normal Tongue: None, normal,Extremity Movements Upper (arms, wrists, hands, fingers): None, normal Lower (legs, knees, ankles, toes): None, normal, Trunk Movements Neck, shoulders, hips: None, normal, Overall Severity Severity of abnormal movements (highest score from questions above): None, normal Incapacitation due to abnormal movements: None, normal Patient's awareness of abnormal movements (rate only patient's report): No Awareness, Dental Status Current problems with teeth and/or dentures?: No Does patient usually wear dentures?: No  CIWA:  CIWA-Ar Total: 0 COWS:     Musculoskeletal: Strength & Muscle Tone: within normal limits Gait & Station: normal Patient leans: N/A  Psychiatric Specialty Exam: Review of Systems  Skin: Negative for itching and rash.  Neurological: Negative for headaches.  Psychiatric/Behavioral: Positive for depression, hallucinations and substance abuse.  All other systems reviewed and are negative.   Blood pressure 120/78, pulse 87, temperature 98.9 F  (37.2 C), temperature source Oral, resp. rate 17, height 5\' 5"  (1.651 m), weight 105.688 kg (233 lb).Body mass index is 38.77 kg/(m^2).  General Appearance: Fairly Groomed  Engineer, water::  Fair  Speech:  Clear and Coherent  Volume:  Decreased  Mood:  Anxious and Depressed  Affect:  Congruent  Thought Process:  Linear  Orientation:  Full (Time, Place, and Person)  Thought Content:  Hallucinations: Auditory, Paranoid Ideation and Rumination improving  Suicidal Thoughts:  Yes.  without intent/plan   Homicidal Thoughts:  No  Memory:  Immediate;  Fair Recent;   Fair Remote;   Fair  Judgement:  Impaired  Insight:  Shallow  Psychomotor Activity:  Restlessness  Concentration:  Fair  Recall:  [employer] of Knowledge:Fair  Language: Fair  Akathisia:  No    AIMS (if indicated):     Assets:  Communication Skills Desire for Improvement  ADL's:  Intact  Cognition: WNL  Sleep:  Number of Hours: 6.25   Treatment Plan Summary: Daily contact with patient to assess and evaluate symptoms and progress in treatment and Medication management  Continue Seroquel to 600 mg po qhs for psychosis, sleep. Continue Trazodone to 100 mg po qhs prn for sleep. Will continue Trileptal 600 mg po daily for mood lability. Continue Lamictal 25 mg twice a day.   Will restart home medications where needed. Will continue  CIWA /Librium prn for withdrawal sx. Will continue to monitor vitals ,medication compliance and treatment side effects while patient is here.  Will monitor for medical issues as well as call consult as needed.  Will continue Nitrofurantoin 100 mg po bid for UTI - pt is symptomatic.. CSW will start working on disposition. Pt to be referred to substance abuse program,. Patient to participate in therapeutic milieu .       Lamiracle Chaidez T. MD 04/08/2015, 1:40 PM

## 2015-04-08 NOTE — Progress Notes (Signed)
D: Pt is alert and oriented x 4. Pt complained of severe depression of 10 on a 0-10 depression scale with some mild anxiety. She states, "I am still the way I was yesterday; there has been no improvement in my anxiety and depression." Pt denies SI/HI/ and pain however, endorses command auditory hallucinations to hurt self and others. Pt was verbally able to contract for safety. Pt was calm and cooperative through the assessment.     A: Pt was encouraged to attend group. Medications offered as prescribed.  Support, encouragement, and safe environment provided.  15-minute safety checks continue.  R: Pt was med compliant.  Pt attended wrap-up group. Safety checks continue.

## 2015-04-08 NOTE — [facility] Group Notes (Signed)
[place] Group Notes:  (Clinical Social Work)  04/08/2015  [place] Group Notes:  (Clinical Social Work)  04/08/2015  11:00AM-12:00PM  Summary of Progress/Problems:  The main focus of today's process group was to listen to a variety of genres of music and to identify that different types of music provoke different responses.  The patient then was able to identify personally what was soothing for them, as well as energizing.  The patient was significantly late to group, only there for last 15 minutes or so, but she expressed understanding of concepts, as well as knowledge of how each type of music affected her and how this can be used at home as a wellness/recovery tool.  Type of Therapy:  Music Therapy   Participation Level:  Active  Participation Quality:  Attentive and Sharing  Affect:  Blunted  Cognitive:  Oriented  Insight:  Engaged  Engagement in Therapy:  Engaged  Modes of Intervention:   Activity, Exploration  Selmer Dominion, LCSW 04/08/2015

## 2015-04-08 NOTE — Progress Notes (Signed)
Writer has observed patient up in the dayroom watching tv but no interaction with peers. Writer spoke with her 1:1 and she reports her day as being ok, she has sad, flat affect and reports suicidal thoughts in the am but none since then. She attended group and is compliant with scheduled medications. She c/o foot pain which she reports is her neuropathy and requested tylenol which she received. She denies hi/a/v hallucinations with Probation officer. Support given and safety maintained on unit with 15 min checks.

## 2015-04-08 NOTE — Progress Notes (Signed)
Adult Psychoeducational Group Note  Date:  04/08/2015 Time:  8:56 PM  Group Topic/Focus:  Wrap-Up Group:   The focus of this group is to help patients review their daily goal of treatment and discuss progress on daily workbooks.  Participation Level:  Active  Participation Quality:  Appropriate  Affect:  Appropriate  Cognitive:  Appropriate  Insight: Appropriate  Engagement in Group:  Engaged  Modes of Intervention:  Discussion  Additional Comments: The patient expressed she attended group.The patient also said that she rates her day a 6.  Nash Shearer 04/08/2015, 8:56 PM

## 2015-04-09 LAB — GLUCOSE, CAPILLARY
GLUCOSE-CAPILLARY: 131 mg/dL — AB (ref 65–99)
GLUCOSE-CAPILLARY: 117 mg/dL — AB (ref 65–99)
Glucose-Capillary: 169 mg/dL — ABNORMAL HIGH (ref 65–99)
Glucose-Capillary: 145 mg/dL — ABNORMAL HIGH (ref 65–99)
Glucose-Capillary: 176 mg/dL — ABNORMAL HIGH (ref 65–99)
Glucose-Capillary: 164 mg/dL — ABNORMAL HIGH (ref 65–99)

## 2015-04-09 MED ORDER — HALOPERIDOL 5 MG PO TABS
5.0000 mg | ORAL_TABLET | Freq: Two times a day (BID) | ORAL | Status: DC
  Administered 2015-04-09 – 2015-04-10 (×2): 5 mg via ORAL
  Filled 2015-04-09 (×4): qty 1

## 2015-04-09 MED ORDER — BENZTROPINE MESYLATE 0.5 MG PO TABS
0.5000 mg | ORAL_TABLET | Freq: Two times a day (BID) | ORAL | Status: DC
  Administered 2015-04-09 – 2015-04-11 (×4): 0.5 mg via ORAL
  Filled 2015-04-09 (×2): qty 14
  Filled 2015-04-09 (×6): qty 1

## 2015-04-09 MED ORDER — QUETIAPINE FUMARATE ER 400 MG PO TB24
400.0000 mg | ORAL_TABLET | Freq: Every day | ORAL | Status: DC
  Administered 2015-04-09: 400 mg via ORAL
  Filled 2015-04-09 (×2): qty 1

## 2015-04-09 NOTE — [facility] Group Notes (Signed)
[facility] Group Notes:  (Counselor/Nursing/MHT/Case Management/Adjunct)  04/09/2015 1:15PM  Type of Therapy:  Group Therapy  Participation Level:  Active  Participation Quality:  Appropriate  Affect:  Flat  Cognitive:  Oriented  Insight:  Improving  Engagement in Group:  Limited  Engagement in Therapy:  Limited  Modes of Intervention:  Discussion, Exploration and Socialization  Summary of Progress/Problems: The topic for group was balance in life.  Pt participated in the discussion about when their life was in balance and out of balance and how this feels.  Pt discussed ways to get back in balance and short term goals they can work on to get where they want to be.  Persais did a nice job of demonstrating her inability to think abstractly today.  We were talking about the power of positive thinking, and how it can be helpful to adopt a positive outlook.  When Laterra was given the opportunity to contribute, her example was of sister calling her and telling her she could not stay with her, and then later calling to say that she could.  "That was positive, right?"  She acknowledged that her sister is a good support, especially if she lets her stay there, and that will help her stay in an emotionally balanced place.   [name] 04/09/2015 4:01 PM

## 2015-04-09 NOTE — Progress Notes (Signed)
Pt presents with depressed mood, affect irritable. Snow reports she is doing '' all right '' states she continues to have auditory hallucinations at times. When writer asked patient to elaborate on this she gave vague answers stating '' they are just loud. Not telling me to do stuff '' She reports having poor sleep last night as well. She denies any SI and has been appropriate on the unit. She did complain of knee pain, prn medications given. A. Discussed above with dr. Shea Evans. Medications given as ordered. R. Patient is safe, in no acute distress at this time. Will continue to monitor q 15 minutes for safety.

## 2015-04-09 NOTE — Plan of Care (Signed)
Problem: Diagnosis: Increased Risk For Suicide Attempt Goal: STG-Patient Will Comply With Medication Regime Outcome: Progressing Patient is compliant with scheduled medications.     

## 2015-04-09 NOTE — Progress Notes (Signed)
[facility] MD Progress Note  04/09/2015 4:32 PM Maureen Swanson  MRN:  [id number] Subjective: I feel funny today, I hear AH saying bad things as well as I feel depressed.   Objective:Maureen Swanson is a 49 y.o. AA female with history of schizoaffective disorder , lives with her son in [place] , presented with worseing depression and anxiety for the past 2 weeks. Patient per initial notes in EHR Stated " hopelessness, fatigue, loss of interest in usual pleasures, and despondence. Patient's symptoms are triggered by loneliness and "Not having anyone around to talk to me". Patient's anxiety is associated with daily panic attacks. She has suicidal thoughts to overdose, run into traffic, and/or cut self. "  Patient seen and chart reviewed.  Pt today continues to complain about AH asking her to do negative things and is louder.. Pt is on a high dose of seroquel , continues to not benefit from it. Pt is willing to try haldol. Pt is also having passive SI. Pt continues to be motivated to go to a substance abuse program. Pt would also want writer to communicate with her sister regarding her care. Per nursing pt continues to be depressed, psychotic and needs a lot of support.   Principal Problem: Schizoaffective disorder, bipolar type (Maureen Swanson) Diagnosis:   Patient Active Problem List   Diagnosis Date Noted  . Cocaine use disorder, moderate, in early remission [F14.90] 04/03/2015  . Schizoaffective disorder, bipolar type ([name]) [F25.0] 04/03/2015  . Diabetes ([place]) [E11.9] 04/03/2015  . Alcohol use disorder, moderate, dependence ([place]) [F10.20] 04/03/2015  . Conversion disorder [F44.9] 12/09/2014  . Acute ischemic stroke ([place]) [I63.9] 12/08/2014  . Left-sided weakness [M62.89] 12/08/2014  . 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 Knee pain [M25.569] 10/17/2010  . HYPERGLYCEMIA [R73.09] 08/16/2010  . MAMMOGRAM, ABNORMAL, RIGHT [R92.8] 08/05/2010  . HYPERLIPIDEMIA [E78.5] 06/21/2010  . AMENORRHEA,  SECONDARY [N91.2] 10/29/2009  . 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 HELICOBACTER PYLORI INFECTION [A04.8] 04/02/2009  . GERD [K21.9] 10/11/2007  . Essential hypertension [I10] 02/26/2007   Total Time spent with patient: 25 minutes  Past Psychiatric History: Patient with several admissions to [facility] as well as [facility]. Pt with hx of suicide attempts in the past x3, OD as well as cutting self  Past Medical History:  Past Medical History  Diagnosis Date  . High cholesterol   . Depression   . Gout   . Anxiety   . Bipolar 1 disorder ([place])   . GERD (gastroesophageal reflux disease)   . Substance abuse     crack cocaine  . Headache(784.0)     migraines  . TMJ (temporomandibular joint disorder)   . Asthma     daily and prn inhalers  . Arthritis     back and knees  . Gastric ulcer   . 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 Hypertension     under control, has been on med. x 12 yrs.  . Diabetes mellitus     diet-controlled    Past Surgical History  Procedure Laterality Date  . Knee arthroscopy w/ partial medial meniscectomy  05/01/2010    right  . Breast lumpectomy with needle localization  04/19/2012    Procedure: BREAST LUMPECTOMY WITH NEEDLE LOCALIZATION;  Surgeon: [name], MD;  Location: [place];  Service: General;  Laterality: Right;   Family History:  Family History  Problem Relation Age of Onset  . Diabetes Mother   .  Breast cancer Mother   . Cancer Father   . Bipolar disorder Maternal Aunt     Family Psychiatric History:Aunt has hx of bipolar disorder. Denies suicide in family.  Social History: Currently is single, lives with son in [place]. Hx of being in prison for 30 days , a year and half ago for assault on ex boyfriend. History        Social History:  History  Alcohol Use  . 0.0 oz/week    Comment: reports she drinks 3 40 ounce beers daily     History  Drug Use No    Comment: last use 08/02/2012     Social History   Social History  . Marital Status: Single    Spouse Name: N/A  . Number of Children: N/A  . Years of Education: N/A   Social History Main Topics  . Smoking status: Current Every Day Smoker -- 0.50 packs/day for 15 years    Types: Cigarettes    Last Attempt to Quit: 08/02/2012  . Smokeless tobacco: Never Used     Comment: quit smoking 02/2012  . Alcohol Use: 0.0 oz/week     Comment: reports she drinks 3 40 ounce beers daily  . Drug Use: No     Comment: last use 08/02/2012  . Sexual Activity: Not Asked   Other Topics Concern  . None   Social History Narrative   Additional Social History:   Sleep: Fair  Appetite:  Fair  Current Medications: Current Facility-Administered Medications  Medication Dose Route Frequency Provider Last Rate Last Dose  . acetaminophen (TYLENOL) tablet 650 mg  650 mg Oral Q6H PRN Patrecia Pour, NP   650 mg at 04/09/15 0843  . albuterol (PROVENTIL HFA;VENTOLIN HFA) 108 (90 BASE) MCG/ACT inhaler 2 puff  2 puff Inhalation Q6H PRN Patrecia Pour, NP   2 puff at 04/07/15 [phone]  . alum & mag hydroxide-simeth (MAALOX/MYLANTA) 200-200-20 MG/5ML suspension 30 mL  30 mL Oral Q4H PRN Patrecia Pour, NP      . amLODipine (NORVASC) tablet 5 mg  5 mg Oral Daily Patrecia Pour, NP   5 mg at 04/09/15 0835  . aspirin chewable tablet 81 mg  81 mg Oral Daily Patrecia Pour, NP   81 mg at 04/09/15 8119  . atenolol (TENORMIN) tablet 100 mg  100 mg Oral Daily Patrecia Pour, NP   100 mg at 04/09/15 0835  . atorvastatin (LIPITOR) tablet 40 mg  40 mg Oral QPM Patrecia Pour, NP   40 mg at 04/08/15 1709  . baclofen (LIORESAL) tablet 10 mg  10 mg Oral TID Patrecia Pour, NP   10 mg at 04/09/15 1157  . benztropine (COGENTIN) tablet 0.5 mg  0.5 mg Oral BID Neville Walston, MD      . haloperidol (HALDOL) tablet 5 mg  5 mg Oral Q6H PRN [name], MD       And  . benztropine (COGENTIN) tablet 1 mg  1 mg Oral Q6H PRN [name], MD      . calcium carbonate  (OS-CAL - dosed in mg of elemental calcium) tablet 500 mg of elemental calcium  1 tablet Oral Q breakfast Patrecia Pour, NP   500 mg of elemental calcium at 04/09/15 0835  . celecoxib (CELEBREX) capsule 100 mg  100 mg Oral BID Patrecia Pour, NP   100 mg at 04/09/15 0836  . chlordiazePOXIDE (LIBRIUM) capsule 25 mg  25 mg Oral Q4H PRN  [name], MD      . feeding supplement (GLUCERNA SHAKE) (GLUCERNA SHAKE) liquid 237 mL  237 mL Oral TID BM Larrie Lucia, MD   237 mL at 04/09/15 1019  . folic acid (FOLVITE) tablet 1 mg  1 mg Oral Daily Patrecia Pour, NP   1 mg at 04/09/15 5400  . furosemide (LASIX) tablet 40 mg  40 mg Oral Daily Patrecia Pour, NP   40 mg at 04/09/15 0836  . gabapentin (NEURONTIN) capsule 400 mg  400 mg Oral BID Patrecia Pour, NP   400 mg at 04/09/15 8676  . haloperidol (HALDOL) tablet 5 mg  5 mg Oral BID [name], MD      . hydrochlorothiazide (HYDRODIURIL) tablet 25 mg  25 mg Oral Daily Patrecia Pour, NP   25 mg at 04/09/15 0836  . hydrOXYzine (ATARAX/VISTARIL) tablet 25 mg  25 mg Oral Q6H PRN Patrecia Pour, NP   25 mg at 04/08/15 0616  . ibuprofen (ADVIL,MOTRIN) tablet 600 mg  600 mg Oral Q8H PRN Patrecia Pour, NP   600 mg at 04/04/15 1950  . insulin aspart (novoLOG) injection 0-15 Units  0-15 Units Subcutaneous TID WC [name], MD   Stopped at 04/09/15 1209  . insulin glargine (LANTUS) injection 30 Units  30 Units Subcutaneous QHS Patrecia Pour, NP   30 Units at 04/08/15 2105  . lamoTRIgine (LAMICTAL) tablet 25 mg  25 mg Oral BID Kathlee Nations, MD   25 mg at 04/09/15 0836  . linagliptin (TRADJENTA) tablet 5 mg  5 mg Oral Daily Patrecia Pour, NP   5 mg at 04/09/15 0836  . loratadine (CLARITIN) tablet 10 mg  10 mg Oral Daily Patrecia Pour, NP   10 mg at 04/09/15 0836  . magnesium hydroxide (MILK OF MAGNESIA) suspension 30 mL  30 mL Oral Daily PRN Patrecia Pour, NP      . metFORMIN (GLUCOPHAGE) tablet 1,000 mg  1,000 mg Oral BID WC Patrecia Pour, NP    1,000 mg at 04/09/15 0836  . metoCLOPramide (REGLAN) tablet 10 mg  10 mg Oral TID AC & HS Patrecia Pour, NP   10 mg at 04/09/15 1109  . multivitamin with minerals tablet 1 tablet  1 tablet Oral Daily Patrecia Pour, NP   1 tablet at 04/09/15 9326  . naltrexone (DEPADE) tablet 50 mg  50 mg Oral Daily Patrecia Pour, NP   50 mg at 04/09/15 0836  . nicotine (NICODERM CQ - dosed in mg/24 hours) patch 21 mg  21 mg Transdermal Daily Patrecia Pour, NP   21 mg at 04/09/15 0836  . nitrofurantoin (MACRODANTIN) capsule 100 mg  100 mg Oral Q12H [name], MD   100 mg at 04/09/15 0836  . ondansetron (ZOFRAN) tablet 4 mg  4 mg Oral Q8H PRN Patrecia Pour, NP      . Oxcarbazepine (TRILEPTAL) tablet 600 mg  600 mg Oral Daily Patrecia Pour, NP   600 mg at 04/09/15 0836  . pantoprazole (PROTONIX) EC tablet 40 mg  40 mg Oral Daily Patrecia Pour, NP   40 mg at 04/09/15 0836  . potassium chloride SA (K-DUR,KLOR-CON) CR tablet 20 mEq  20 mEq Oral Daily Patrecia Pour, NP   20 mEq at 04/09/15 0836  . QUEtiapine (SEROQUEL XR) 24 hr tablet 400 mg  400 mg Oral Q2000 [name], MD      .  thiamine (VITAMIN B-1) tablet 100 mg  100 mg Oral Daily Patrecia Pour, NP   100 mg at 04/09/15 0835  . traZODone (DESYREL) tablet 100 mg  100 mg Oral QHS PRN [name], MD   100 mg at 04/08/15 2102    Lab Results:  Results for orders placed or performed during the hospital encounter of 04/03/15 (from the past 48 hour(s))  Glucose, capillary     Status: Abnormal   Collection Time: 04/07/15  4:51 PM  Result Value Ref Range   Glucose-Capillary 211 (H) 65 - 99 mg/dL   Comment 1 Document in Chart   Glucose, capillary     Status: Abnormal   Collection Time: 04/07/15  8:47 PM  Result Value Ref Range   Glucose-Capillary 189 (H) 65 - 99 mg/dL   Comment 1 Notify RN   Glucose, capillary     Status: Abnormal   Collection Time: 04/08/15  6:02 AM  Result Value Ref Range   Glucose-Capillary 112 (H) 65 - 99 mg/dL   Comment 1  Notify RN   Glucose, capillary     Status: Abnormal   Collection Time: 04/08/15 11:59 AM  Result Value Ref Range   Glucose-Capillary 164 (H) 65 - 99 mg/dL   Comment 1 Document in Chart   Glucose, capillary     Status: Abnormal   Collection Time: 04/08/15  5:08 PM  Result Value Ref Range   Glucose-Capillary 169 (H) 65 - 99 mg/dL   Comment 1 Document in Chart   Glucose, capillary     Status: Abnormal   Collection Time: 04/08/15  9:08 PM  Result Value Ref Range   Glucose-Capillary 131 (H) 65 - 99 mg/dL  Glucose, capillary     Status: Abnormal   Collection Time: 04/09/15  6:23 AM  Result Value Ref Range   Glucose-Capillary 145 (H) 65 - 99 mg/dL  Glucose, capillary     Status: Abnormal   Collection Time: 04/09/15 12:05 PM  Result Value Ref Range   Glucose-Capillary 117 (H) 65 - 99 mg/dL   Comment 1 Notify RN     Physical Findings: AIMS: Facial and Oral Movements Muscles of Facial Expression: None, normal Lips and Perioral Area: None, normal Jaw: None, normal Tongue: None, normal,Extremity Movements Upper (arms, wrists, hands, fingers): None, normal Lower (legs, knees, ankles, toes): None, normal, Trunk Movements Neck, shoulders, hips: None, normal, Overall Severity Severity of abnormal movements (highest score from questions above): None, normal Incapacitation due to abnormal movements: None, normal Patient's awareness of abnormal movements (rate only patient's report): No Awareness, Dental Status Current problems with teeth and/or dentures?: No Does patient usually wear dentures?: No  CIWA:  CIWA-Ar Total: 0 COWS:     Musculoskeletal: Strength & Muscle Tone: within normal limits Gait & Station: normal Patient leans: N/A  Psychiatric Specialty Exam: Review of Systems  Skin: Negative for itching and rash.  Neurological: Negative for headaches.  Psychiatric/Behavioral: Positive for depression, hallucinations and substance abuse.  All other systems reviewed and are  negative.   Blood pressure 117/75, pulse 97, temperature 98.4 F (36.9 C), temperature source Oral, resp. rate 16, height 5\' 5"  (1.651 m), weight 105.688 kg (233 lb).Body mass index is 38.77 kg/(m^2).  General Appearance: Fairly Groomed  Engineer, water::  Fair  Speech:  Clear and Coherent  Volume:  Decreased  Mood:  Anxious and Depressed  Affect:  Congruent  Thought Process:  Linear  Orientation:  Full (Time, Place, and Person)  Thought Content:  Hallucinations:  Auditory, Paranoid Ideation and Rumination improving  Suicidal Thoughts:  Yes.  without intent/plan   Homicidal Thoughts:  No  Memory:  Immediate;   Fair Recent;   Fair Remote;   Fair  Judgement:  Impaired  Insight:  Shallow  Psychomotor Activity:  Restlessness  Concentration:  Fair  Recall:  [place]: Fair  Akathisia:  No    AIMS (if indicated):     Assets:  Communication Skills Desire for Improvement  ADL's:  Intact  Cognition: WNL  Sleep:  Number of Hours: 6.25   Contacted Gladyes Kudo at [phone] - pt's sister - who is concerned about pt's diagnosis as well as treatment. Discussed treatment plan as well as disposition plan.     Treatment Plan Summary: Daily contact with patient to assess and evaluate symptoms and progress in treatment and Medication management  Cross taper Seroquel with Haldol. Pt reports AH as getting louder . Continue Trazodone 100 mg po qhs prn for sleep. Will continue Trileptal 600 mg po daily for mood lability. Continue Lamictal 25 mg po twice a day.   Will restart home medications where needed. Will continue  CIWA /Librium prn for withdrawal sx. Will continue to monitor vitals ,medication compliance and treatment side effects while patient is here.  Will monitor for medical issues as well as call consult as needed.  Will continue Nitrofurantoin 100 mg po bid for UTI - pt is symptomatic.. CSW will start working on disposition. Pt to be referred to  substance abuse program,. Patient to participate in therapeutic milieu .       Ansley Stanwood MD 04/09/2015, 4:32 PM

## 2015-04-09 NOTE — Plan of Care (Signed)
Problem: Alteration in mood Goal: LTG-Patient reports reduction in suicidal thoughts (Patient reports reduction in suicidal thoughts and is able to verbalize a safety plan for whenever patient is feeling suicidal)  Pt denies any suicidal ideation this am with Probation officer , on 04-09-15

## 2015-04-10 LAB — GLUCOSE, CAPILLARY
GLUCOSE-CAPILLARY: 108 mg/dL — AB (ref 65–99)
GLUCOSE-CAPILLARY: 177 mg/dL — AB (ref 65–99)
Glucose-Capillary: 130 mg/dL — ABNORMAL HIGH (ref 65–99)

## 2015-04-10 MED ORDER — HALOPERIDOL 5 MG PO TABS
10.0000 mg | ORAL_TABLET | Freq: Every evening | ORAL | Status: DC
  Administered 2015-04-10: 10 mg via ORAL
  Filled 2015-04-10 (×3): qty 2

## 2015-04-10 MED ORDER — QUETIAPINE FUMARATE ER 300 MG PO TB24
300.0000 mg | ORAL_TABLET | Freq: Every day | ORAL | Status: DC
  Administered 2015-04-10: 300 mg via ORAL
  Filled 2015-04-10: qty 1
  Filled 2015-04-10: qty 7
  Filled 2015-04-10: qty 1

## 2015-04-10 MED ORDER — NITROFURANTOIN MACROCRYSTAL 100 MG PO CAPS
100.0000 mg | ORAL_CAPSULE | Freq: Two times a day (BID) | ORAL | Status: AC
Start: 2015-04-10 — End: 2015-04-10
  Administered 2015-04-10: 100 mg via ORAL
  Filled 2015-04-10: qty 1

## 2015-04-10 MED ORDER — TRAZODONE HCL 150 MG PO TABS
150.0000 mg | ORAL_TABLET | Freq: Every day | ORAL | Status: DC
  Administered 2015-04-10: 150 mg via ORAL
  Filled 2015-04-10: qty 7
  Filled 2015-04-10 (×2): qty 1

## 2015-04-10 MED ORDER — HALOPERIDOL 5 MG PO TABS
5.0000 mg | ORAL_TABLET | Freq: Every day | ORAL | Status: DC
  Administered 2015-04-11: 5 mg via ORAL
  Filled 2015-04-10: qty 1
  Filled 2015-04-10: qty 21
  Filled 2015-04-10: qty 1

## 2015-04-10 NOTE — Progress Notes (Signed)
NSG shift assessment. 7a-7p.   D: Affect blunted, mood depressed, behavior appropriate. Attends groups and participates.  She rates her depression as 8/10 with 10 being the worst, feelings of hop0elessness are 8/10 with 10 being worst, and her anxiety is 8/10 with 10 being worst.  She continues to complain of right knee and right foot pain which is chronic. No other complaints are voiced. Cooperative with staff and is getting along well with peers.   A: Observed pt interacting in group and in the milieu: Support and encouragement offered. Safety maintained with observations every 15 minutes.   R:  Contracts for safety and continues to follow the treatment plan, working on learning new coping skills.

## 2015-04-10 NOTE — Progress Notes (Signed)
D: Pt is alert and oriented x 4. Pt with flat affects continues to complain of severe depression of 10 on a 0-10 depression scale with some mild anxiety. She states, "I am very depressed, not just happy." Pt who has been isolative denies SI/HI/AVH and pain however. Pt was calm and cooperative through the assessment.     A: Pt was encouraged to attend group. Medications offered as prescribed.  Support, encouragement, and safe environment provided.  15-minute safety checks continue.  R: Pt was med compliant.  Pt did not attend wrap-up group. Safety checks continue.

## 2015-04-10 NOTE — Tx Team (Signed)
Interdisciplinary Treatment Plan Update (Adult) Date: 04/10/2015    Time Reviewed: 9:30 AM  Progress in Treatment: Attending groups: Continuing to assess, patient new to milieu Participating in groups: Continuing to assess, patient new to milieu Taking medication as prescribed: Yes Tolerating medication: Yes Family/Significant other contact made: Yes Patient understands diagnosis: Yes Discussing patient identified problems/goals with staff: Yes Medical problems stabilized or resolved: Yes Denies suicidal/homicidal ideation: Yes Issues/concerns per patient self-inventory: Yes Other:  New problem(s) identified: N/A  Discharge Plan or Barriers: See below  Reason for Continuation of Hospitalization:  Depression Anxiety Medication Stabilization   Comments: N/A  Estimated length of stay: Likely d/c tomorrow   Patient is a 49 year old female admitted for increasing depression, SI, history of Bipolar I Disorder. Patient lives in [place] with her son. Patient will benefit from crisis stabilization, medication evaluation, group therapy, and psycho education in addition to case management for discharge planning. Patient and CSW reviewed pt's identified goals and treatment plan. Pt verbalized understanding and agreed to treatment plan.  Neurontin, Lamictal, Trileptal, Seroquel trial     Review of initial/current patient goals per problem list:  1. Goal(s): Patient will participate in aftercare plan   Met: Yes   Target date: 3-5 days post admission date   As evidenced by: Patient will participate within aftercare plan AEB aftercare provider and housing plan at discharge being identified. 10/18: Goal not met: CSW assessing for appropriate referrals for pt and will have follow up secured prior to d/c. 10/21:  Pt is putting all her eggs in the [facility] basket.  They will let her know on Monday if she is a candidate.  States she will contact her sister in the meantime to see if  she can stay there. 04/10/15  Jonah has determined that she will be able to stay with her sister.  Plans to follow up with [facility]     2. Goal (s): Patient will exhibit decreased depressive symptoms and suicidal ideations.   Met: No   Target date: 3-5 days post admission date   As evidenced by: Patient will utilize self rating of depression at 3 or below and demonstrate decreased signs of depression or be deemed stable for discharge by MD. 10/18: Goal not met: Pt presents with flat affect and depressed mood.  Pt admitted with depression rating of 10.  Pt to show decreased sign of depression and a rating of 3 or less before d/c.   10/21: Continues to c/o high depression. 10/25:  Rates depression an 8 today    5. Goal(s): Patient will demonstrate decreased signs of psychosis  * Met: Yes  * Target date: 3-5 days post admission date  * As evidenced by: Patient will demonstrate decreased frequency of AVH or return to baseline function 10/18: Goal not met: Pt to take medication as prescribed to decrease psychosis to baseline.  10/21:  Continues to c/o AH. 04/10/15:  Pt is at baseline.      Attendees: Patient:  04/03/2015 9:52 AM   Family:  04/03/2015 9:52 AM   Physician: [name], MD 04/03/2015 9:52 AM   Nursing: Hedy Jacob 04/03/2015 9:52 AM   CSW: Ripley Fraise  04/03/2015 9:52 AM   Other:  04/03/2015 9:52 AM   Other:  04/03/2015 9:52 AM   Other: Lars Pinks, Nurse CM 04/03/2015 9:52 AM   Other: Lucinda Dell, [facility] 04/03/2015 9:52 AM   Other:  04/03/2015 9:52 AM   Other:  04/03/2015 9:52 AM  Scribe for Treatment Team:  Tilden Fossa, MSW, [place]

## 2015-04-10 NOTE — [facility] Group Notes (Signed)
Adult Psychoeducational Group Note  Date:  04/10/2015 Time:  9:24 PM  Group Topic/Focus:  Wrap-Up Group:   The focus of this group is to help patients review their daily goal of treatment and discuss progress on daily workbooks.  Participation Level:  Minimal  Participation Quality:  Attentive  Affect:  Flat  Cognitive:  Appropriate  Insight: Appropriate  Engagement in Group:  Limited  Modes of Intervention:  Discussion  Additional Comments:  Patient stated her day was fair and that she is discharging tomorrow to her sister's house.  Victorino Sparrow A 04/10/2015, 9:24 PM

## 2015-04-10 NOTE — [facility] Group Notes (Signed)
[place] Group Notes:  (Nursing/MHT/Case Management/Adjunct)  Date:  04/10/2015  Time:  1:40 PM  Type of Therapy:  Psychoeducational Skills  Participation Level:  Active  Participation Quality:  Appropriate and Attentive  Affect:  Blunted  Cognitive:  Alert and Appropriate  Insight:  Appropriate and Good  Engagement in Group:  Developing/Improving, Improving and Supportive  Modes of Intervention:  Discussion, Education, Socialization and Support  Summary of Progress/Problems: Today's topic of discussion was "Recovery". Pt's goal for today is to make plans to go home tomorrow.   Gazella Anglin, Diane C 04/10/2015, 1:40 PM

## 2015-04-10 NOTE — [facility] Group Notes (Signed)
[place] LCSW Group Therapy  04/10/2015 , 12:36 PM   Type of Therapy:  Group Therapy  Participation Level:  Active  Participation Quality:  Attentive  Affect:  Appropriate  Cognitive:  Alert  Insight:  Improving  Engagement in Therapy:  Engaged  Modes of Intervention:  Discussion, Exploration and Socialization  Summary of Progress/Problems: Today's group focused on the term Diagnosis.  Participants were asked to define the term, and then pronounce whether it is a negative, positive or neutral term.  Stayed the entire time.  Limited engagement.  Eyes shut for most of the time.  However, when questioned directly was able to give an answer that was subject relevant.  [name] 04/10/2015 , 12:36 PM

## 2015-04-10 NOTE — Progress Notes (Signed)
[facility] MD Progress Note  04/10/2015 11:47 AM Maureen [place]  MRN:  [id number] Subjective: I did not sleep last night . I do not think my medications are where it needs to be.'    Objective:Maureen [place] is a 49 y.o. AA female with history of schizoaffective disorder , lives with her son in [place] , presented with worseing depression and anxiety for the past 2 weeks. Patient per initial notes in EHR Stated " hopelessness, fatigue, loss of interest in usual pleasures, and despondence. Patient's symptoms are triggered by loneliness and "Not having anyone around to talk to me". Patient's anxiety is associated with daily panic attacks. She has suicidal thoughts to overdose, run into traffic, and/or cut self. "  Patient seen and chart reviewed.  Pt today continues to appear depressed, withdrawn , avoids eye contact. Continues to have AH , but states it is reducing. Pt had a restless night last night . She is currently being tapered off of her seroquel. Haldol was added for her psychosis. Pt seems to be tolerating it well. Denies any ADRs of medications. Pt continues to be motivated to get help with her substance abuse. CSW will continue to work on referral to rehab.  Per nursing pt continues to be depressed, psychotic and needs a lot of support.   Principal Problem: Schizoaffective disorder, bipolar type ([place]) Diagnosis:   Patient Active Problem List   Diagnosis Date Noted  . Cocaine use disorder, moderate, in early remission [F14.90] 04/03/2015  . Schizoaffective disorder, bipolar type ([place]) [F25.0] 04/03/2015  . Diabetes ([place]) [E11.9] 04/03/2015  . Alcohol use disorder, moderate, dependence ([place]) [F10.20] 04/03/2015  . Conversion disorder [F44.9] 12/09/2014  . Acute ischemic stroke ([place]) [I63.9] 12/08/2014  . Left-sided weakness [M62.89] 12/08/2014  . 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 Knee pain [M25.569] 10/17/2010  . HYPERGLYCEMIA [R73.09] 08/16/2010  . MAMMOGRAM,  ABNORMAL, RIGHT [R92.8] 08/05/2010  . HYPERLIPIDEMIA [E78.5] 06/21/2010  . AMENORRHEA, SECONDARY [N91.2] 10/29/2009  . 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 HELICOBACTER PYLORI INFECTION [A04.8] 04/02/2009  . GERD [K21.9] 10/11/2007  . Essential hypertension [I10] 02/26/2007   Total Time spent with patient: 25 minutes  Past Psychiatric History: Patient with several admissions to [facility] as well as [facility]. Pt with hx of suicide attempts in the past x3, OD as well as cutting self  Past Medical History:  Past Medical History  Diagnosis Date  . High cholesterol   . Depression   . Gout   . Anxiety   . Bipolar 1 disorder ([place])   . GERD (gastroesophageal reflux disease)   . Substance abuse     crack cocaine  . Headache(784.0)     migraines  . TMJ (temporomandibular joint disorder)   . Asthma     daily and prn inhalers  . Arthritis     back and knees  . Gastric ulcer   . 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 Hypertension     under control, has been on med. x 12 yrs.  . Diabetes mellitus     diet-controlled    Past Surgical History  Procedure Laterality Date  . Knee arthroscopy w/ partial medial meniscectomy  05/01/2010    right  . Breast lumpectomy with needle localization  04/19/2012    Procedure: BREAST LUMPECTOMY WITH NEEDLE LOCALIZATION;  Surgeon: [name], MD;  Location: [facility];  Service: General;  Laterality: Right;   Family History:  Family History  Problem Relation Age of Onset  . Diabetes Mother   . Breast cancer Mother   . Cancer Father   . Bipolar disorder Maternal Aunt     Family Psychiatric History:Aunt has hx of bipolar disorder. Denies suicide in family.  Social History: Currently is single, lives [place] son in [place]. Hx of being in prison for 30 days , a year and half ago for assault on ex boyfriend. History        Social History:  History  Alcohol Use  . 0.0 oz/week    Comment: reports she drinks 3  40 ounce beers daily     History  Drug Use No    Comment: last use 08/02/2012    Social History   Social History  . Marital Status: Single    Spouse Name: N/A  . Number of Children: N/A  . Years of Education: N/A   Social History Main Topics  . Smoking status: Current Every Day Smoker -- 0.50 packs/day for 15 years    Types: Cigarettes    Last Attempt to Quit: 08/02/2012  . Smokeless tobacco: Never Used     Comment: quit smoking 02/2012  . Alcohol Use: 0.0 oz/week     Comment: reports she drinks 3 40 ounce beers daily  . Drug Use: No     Comment: last use 08/02/2012  . Sexual Activity: Not Asked   Other Topics Concern  . None   Social History Narrative   Additional Social History:   Sleep: Poor  Appetite:  Fair  Current Medications: Current Facility-Administered Medications  Medication Dose Route Frequency Provider Last Rate Last Dose  . acetaminophen (TYLENOL) tablet 650 mg  650 mg Oral Q6H PRN Patrecia Pour, NP   650 mg at 04/10/15 6283  . albuterol (PROVENTIL HFA;VENTOLIN HFA) 108 (90 BASE) MCG/ACT inhaler 2 puff  2 puff Inhalation Q6H PRN Patrecia Pour, NP   2 puff at 04/07/15 [phone]  . alum & mag hydroxide-simeth (MAALOX/MYLANTA) 200-200-20 MG/5ML suspension 30 mL  30 mL Oral Q4H PRN Patrecia Pour, NP      . amLODipine (NORVASC) tablet 5 mg  5 mg Oral Daily Patrecia Pour, NP   5 mg at 04/10/15 0854  . aspirin chewable tablet 81 mg  81 mg Oral Daily Patrecia Pour, NP   81 mg at 04/10/15 0853  . atenolol (TENORMIN) tablet 100 mg  100 mg Oral Daily Patrecia Pour, NP   100 mg at 04/10/15 0854  . atorvastatin (LIPITOR) tablet 40 mg  40 mg Oral QPM Patrecia Pour, NP   40 mg at 04/09/15 1641  . baclofen (LIORESAL) tablet 10 mg  10 mg Oral TID Patrecia Pour, NP   10 mg at 04/10/15 0859  . benztropine (COGENTIN) tablet 0.5 mg  0.5 mg Oral BID [name], MD   0.5 mg at 04/10/15 0858  . haloperidol (HALDOL) tablet 5 mg  5 mg Oral Q6H PRN [name], MD        And  . benztropine (COGENTIN) tablet 1 mg  1 mg Oral Q6H PRN [name], MD      . calcium carbonate (OS-CAL - dosed in mg of elemental calcium) tablet 500 mg of elemental calcium  1 tablet Oral Q breakfast Patrecia Pour, NP   500 mg of elemental calcium at 04/10/15 0832  . celecoxib (CELEBREX) capsule 100 mg  100 mg Oral BID Patrecia Pour, NP  100 mg at 04/10/15 0852  . chlordiazePOXIDE (LIBRIUM) capsule 25 mg  25 mg Oral Q4H PRN Keanu Frickey, MD      . feeding supplement (GLUCERNA SHAKE) (GLUCERNA SHAKE) liquid 237 mL  237 mL Oral TID BM [name], MD   237 mL at 04/10/15 0927  . folic acid (FOLVITE) tablet 1 mg  1 mg Oral Daily Patrecia Pour, NP   1 mg at 04/10/15 0851  . furosemide (LASIX) tablet 40 mg  40 mg Oral Daily Patrecia Pour, NP   40 mg at 04/10/15 0853  . gabapentin (NEURONTIN) capsule 400 mg  400 mg Oral BID Patrecia Pour, NP   400 mg at 04/10/15 8295  . haloperidol (HALDOL) tablet 10 mg  10 mg Oral QPM [name], MD      . Derrill Memo ON 04/11/2015] haloperidol (HALDOL) tablet 5 mg  5 mg Oral Q breakfast Bruce Mayers, MD      . hydrochlorothiazide (HYDRODIURIL) tablet 25 mg  25 mg Oral Daily Patrecia Pour, NP   25 mg at 04/10/15 0900  . hydrOXYzine (ATARAX/VISTARIL) tablet 25 mg  25 mg Oral Q6H PRN Patrecia Pour, NP   25 mg at 04/08/15 0616  . ibuprofen (ADVIL,MOTRIN) tablet 600 mg  600 mg Oral Q8H PRN Patrecia Pour, NP   600 mg at 04/04/15 6213  . insulin aspart (novoLOG) injection 0-15 Units  0-15 Units Subcutaneous TID WC [name], MD   3 Units at 04/09/15 1653  . insulin glargine (LANTUS) injection 30 Units  30 Units Subcutaneous QHS Patrecia Pour, NP   30 Units at 04/09/15 2123  . lamoTRIgine (LAMICTAL) tablet 25 mg  25 mg Oral BID Kathlee Nations, MD   25 mg at 04/10/15 0852  . linagliptin (TRADJENTA) tablet 5 mg  5 mg Oral Daily Patrecia Pour, NP   5 mg at 04/10/15 0900  . loratadine (CLARITIN) tablet 10 mg  10 mg Oral Daily Patrecia Pour, NP   10  mg at 04/10/15 0858  . magnesium hydroxide (MILK OF MAGNESIA) suspension 30 mL  30 mL Oral Daily PRN Patrecia Pour, NP      . metFORMIN (GLUCOPHAGE) tablet 1,000 mg  1,000 mg Oral BID WC Patrecia Pour, NP   1,000 mg at 04/10/15 0865  . metoCLOPramide (REGLAN) tablet 10 mg  10 mg Oral TID AC & HS Patrecia Pour, NP   10 mg at 04/10/15 7846  . multivitamin with minerals tablet 1 tablet  1 tablet Oral Daily Patrecia Pour, NP   1 tablet at 04/10/15 0853  . naltrexone (DEPADE) tablet 50 mg  50 mg Oral Daily Patrecia Pour, NP   50 mg at 04/10/15 0859  . nicotine (NICODERM CQ - dosed in mg/24 hours) patch 21 mg  21 mg Transdermal Daily Patrecia Pour, NP   21 mg at 04/10/15 0901  . nitrofurantoin (MACRODANTIN) capsule 100 mg  100 mg Oral Q12H Kyair Ditommaso, MD      . ondansetron (ZOFRAN) tablet 4 mg  4 mg Oral Q8H PRN Patrecia Pour, NP      . Oxcarbazepine (TRILEPTAL) tablet 600 mg  600 mg Oral Daily Patrecia Pour, NP   600 mg at 04/10/15 0851  . pantoprazole (PROTONIX) EC tablet 40 mg  40 mg Oral Daily Patrecia Pour, NP   40 mg at 04/10/15 9629  . potassium chloride SA (K-DUR,KLOR-CON) CR tablet 20 mEq  20 mEq Oral Daily Patrecia Pour, NP   20 mEq at 04/10/15 0851  . QUEtiapine (SEROQUEL XR) 24 hr tablet 300 mg  300 mg Oral Q2000 Lonell Stamos, MD      . thiamine (VITAMIN B-1) tablet 100 mg  100 mg Oral Daily Patrecia Pour, NP   100 mg at 04/10/15 0853  . traZODone (DESYREL) tablet 150 mg  150 mg Oral QHS [name], MD        Lab Results:  Results for orders placed or performed during the hospital encounter of 04/03/15 (from the past 48 hour(s))  Glucose, capillary     Status: Abnormal   Collection Time: 04/08/15 11:59 AM  Result Value Ref Range   Glucose-Capillary 164 (H) 65 - 99 mg/dL   Comment 1 Document in Chart   Glucose, capillary     Status: Abnormal   Collection Time: 04/08/15  5:08 PM  Result Value Ref Range   Glucose-Capillary 169 (H) 65 - 99 mg/dL   Comment 1 Document  in Chart   Glucose, capillary     Status: Abnormal   Collection Time: 04/08/15  9:08 PM  Result Value Ref Range   Glucose-Capillary 131 (H) 65 - 99 mg/dL  Glucose, capillary     Status: Abnormal   Collection Time: 04/09/15  6:23 AM  Result Value Ref Range   Glucose-Capillary 145 (H) 65 - 99 mg/dL  Glucose, capillary     Status: Abnormal   Collection Time: 04/09/15 12:05 PM  Result Value Ref Range   Glucose-Capillary 117 (H) 65 - 99 mg/dL   Comment 1 Notify RN   Glucose, capillary     Status: Abnormal   Collection Time: 04/09/15  4:46 PM  Result Value Ref Range   Glucose-Capillary 176 (H) 65 - 99 mg/dL   Comment 1 Notify RN    Comment 2 Document in Chart   Glucose, capillary     Status: Abnormal   Collection Time: 04/09/15  9:10 PM  Result Value Ref Range   Glucose-Capillary 164 (H) 65 - 99 mg/dL   Comment 1 Notify RN   Glucose, capillary     Status: Abnormal   Collection Time: 04/10/15  5:44 AM  Result Value Ref Range   Glucose-Capillary 108 (H) 65 - 99 mg/dL   Comment 1 Notify RN     Physical Findings: AIMS: Facial and Oral Movements Muscles of Facial Expression: None, normal Lips and Perioral Area: None, normal Jaw: None, normal Tongue: None, normal,Extremity Movements Upper (arms, wrists, hands, fingers): None, normal Lower (legs, knees, ankles, toes): None, normal, Trunk Movements Neck, shoulders, hips: None, normal, Overall Severity Severity of abnormal movements (highest score from questions above): None, normal Incapacitation due to abnormal movements: None, normal Patient's awareness of abnormal movements (rate only patient's report): No Awareness, Dental Status Current problems with teeth and/or dentures?: No Does patient usually wear dentures?: No  CIWA:  CIWA-Ar Total: 0 COWS:     Musculoskeletal: Strength & Muscle Tone: within normal limits Gait & Station: normal Patient leans: N/A  Psychiatric Specialty Exam: Review of Systems  Skin: Negative for  itching and rash.  Neurological: Negative for headaches.  Psychiatric/Behavioral: Positive for depression, hallucinations and substance abuse.  All other systems reviewed and are negative.   Blood pressure 109/81, pulse 93, temperature 98.3 F (36.8 C), temperature source Oral, resp. rate 15, height 5\' 5"  (1.651 m), weight 105.688 kg (233 lb).Body mass index is 38.77 kg/(m^2).  General Appearance: Fairly Groomed  Eye Contact::  Fair  Speech:  Clear and Coherent  Volume:  Decreased  Mood:  Anxious and Depressed  Affect:  Congruent  Thought Process:  Linear  Orientation:  Full (Time, Place, and Person)  Thought Content:  Hallucinations: Auditory, Paranoid Ideation and Rumination improving  Suicidal Thoughts:  Yes.  without intent/plan improving  Homicidal Thoughts:  No  Memory:  Immediate;   Fair Recent;   Fair Remote;   Fair  Judgement:  Impaired  Insight:  Shallow  Psychomotor Activity:  Restlessness  Concentration:  Fair  Recall:  [place]: Fair  Akathisia:  No    AIMS (if indicated):     Assets:  Communication Skills Desire for Improvement  ADL's:  Intact  Cognition: WNL  Sleep:  Number of Hours: 6.5   04/09/15 Contacted Italia Wolfert at [phone] - pt's sister - who is concerned about pt's diagnosis as well as treatment. Discussed treatment plan as well as disposition plan.     Treatment Plan Summary: Daily contact with patient to assess and evaluate symptoms and progress in treatment and Medication management  Cross taper Seroquel with Haldol. Pt reports AH as getting louder . Increase Trazodone to150 mg po qhs for sleep. Will continue Trileptal 600 mg po daily for mood lability. Continue Lamictal 25 mg po twice a day.   Will restart home medications where needed. Will continue  CIWA /Librium prn for withdrawal sx. Will continue to monitor vitals ,medication compliance and treatment side effects while patient is here.  Will  monitor for medical issues as well as call consult as needed.  Will continue Nitrofurantoin 100 mg po bid for UTI . CSW will start working on disposition. Pt to be referred to substance abuse program,. Patient to participate in therapeutic milieu .       Christhoper Busbee MD 04/10/2015, 11:47 AM

## 2015-04-11 LAB — GLUCOSE, CAPILLARY
GLUCOSE-CAPILLARY: 113 mg/dL — AB (ref 65–99)
GLUCOSE-CAPILLARY: 123 mg/dL — AB (ref 65–99)

## 2015-04-11 MED ORDER — SITAGLIPTIN PHOSPHATE 50 MG PO TABS: 50.0000 mg | ORAL_TABLET | Freq: Every day | ORAL | Status: DC

## 2015-04-11 MED ORDER — ATENOLOL 100 MG PO TABS
100.0000 mg | ORAL_TABLET | Freq: Every day | ORAL | Status: DC
Start: 2015-04-11 — End: 2015-08-02

## 2015-04-11 MED ORDER — CELECOXIB 100 MG PO CAPS
100.0000 mg | ORAL_CAPSULE | Freq: Two times a day (BID) | ORAL | Status: DC
Start: 2015-04-11 — End: 2015-08-02

## 2015-04-11 MED ORDER — QUETIAPINE FUMARATE ER 300 MG PO TB24: 300.0000 mg | ORAL_TABLET | Freq: Every day | ORAL | Status: DC

## 2015-04-11 MED ORDER — INSULIN ASPART 100 UNIT/ML ~~LOC~~ SOLN: 0.0000 [IU] | Freq: Three times a day (TID) | SUBCUTANEOUS | Status: DC

## 2015-04-11 MED ORDER — HYDROXYZINE HCL 25 MG PO TABS: 25.0000 mg | ORAL_TABLET | Freq: Four times a day (QID) | ORAL | Status: DC | PRN

## 2015-04-11 MED ORDER — METFORMIN HCL 1000 MG PO TABS: 1000.0000 mg | ORAL_TABLET | Freq: Two times a day (BID) | ORAL | Status: DC

## 2015-04-11 MED ORDER — POTASSIUM CHLORIDE CRYS ER 20 MEQ PO TBCR: 20.0000 meq | EXTENDED_RELEASE_TABLET | Freq: Every day | ORAL | Status: DC

## 2015-04-11 MED ORDER — LAMOTRIGINE 25 MG PO TABS: 25.0000 mg | ORAL_TABLET | Freq: Two times a day (BID) | ORAL | Status: DC

## 2015-04-11 MED ORDER — TRAZODONE HCL 150 MG PO TABS: 150.0000 mg | ORAL_TABLET | Freq: Every day | ORAL | Status: DC

## 2015-04-11 MED ORDER — BENZTROPINE MESYLATE 0.5 MG PO TABS: 0.5000 mg | ORAL_TABLET | Freq: Two times a day (BID) | ORAL | Status: DC

## 2015-04-11 MED ORDER — GABAPENTIN 400 MG PO CAPS: 400.0000 mg | ORAL_CAPSULE | Freq: Two times a day (BID) | ORAL | Status: DC

## 2015-04-11 MED ORDER — HYDROCHLOROTHIAZIDE 25 MG PO TABS: 25.0000 mg | ORAL_TABLET | Freq: Every day | ORAL | Status: DC

## 2015-04-11 MED ORDER — OXCARBAZEPINE 600 MG PO TABS: 600.0000 mg | ORAL_TABLET | Freq: Every day | ORAL | Status: DC

## 2015-04-11 MED ORDER — HALOPERIDOL 10 MG PO TABS: 10.0000 mg | ORAL_TABLET | Freq: Every evening | ORAL | Status: DC

## 2015-04-11 MED ORDER — ADULT MULTIVITAMIN W/MINERALS CH: 1.0000 | ORAL_TABLET | Freq: Every day | ORAL | Status: DC

## 2015-04-11 MED ORDER — CALCIUM CARBONATE 1250 (500 CA) MG PO TABS: 1.0000 | ORAL_TABLET | Freq: Every day | ORAL | Status: DC

## 2015-04-11 MED ORDER — NICOTINE 21 MG/24HR TD PT24: 21.0000 mg | MEDICATED_PATCH | Freq: Every day | TRANSDERMAL | Status: DC

## 2015-04-11 MED ORDER — FOLIC ACID 1 MG PO TABS
1.0000 mg | ORAL_TABLET | Freq: Every day | ORAL | Status: DC
Start: 2015-04-11 — End: 2015-08-02

## 2015-04-11 MED ORDER — INSULIN GLARGINE 100 UNIT/ML ~~LOC~~ SOLN: 30.0000 [IU] | Freq: Every day | SUBCUTANEOUS | Status: DC

## 2015-04-11 MED ORDER — BACLOFEN 10 MG PO TABS: 10.0000 mg | ORAL_TABLET | Freq: Three times a day (TID) | ORAL | Status: DC

## 2015-04-11 MED ORDER — ASPIRIN 81 MG PO TABS: 81.0000 mg | ORAL_TABLET | Freq: Every day | ORAL | Status: DC

## 2015-04-11 MED ORDER — HALOPERIDOL 5 MG PO TABS: 5.0000 mg | ORAL_TABLET | Freq: Every day | ORAL | Status: DC

## 2015-04-11 MED ORDER — NALTREXONE HCL 50 MG PO TABS: 50.0000 mg | ORAL_TABLET | Freq: Every day | ORAL | Status: DC

## 2015-04-11 MED ORDER — FUROSEMIDE 40 MG PO TABS: 40.0000 mg | ORAL_TABLET | Freq: Every day | ORAL | Status: DC

## 2015-04-11 MED ORDER — ATORVASTATIN CALCIUM 40 MG PO TABS: 40.0000 mg | ORAL_TABLET | Freq: Every evening | ORAL | Status: DC

## 2015-04-11 MED ORDER — AMLODIPINE BESYLATE 5 MG PO TABS: 5.0000 mg | ORAL_TABLET | Freq: Every day | ORAL | Status: DC

## 2015-04-11 MED ORDER — METOCLOPRAMIDE HCL 10 MG PO TABS: 10.0000 mg | ORAL_TABLET | Freq: Three times a day (TID) | ORAL | Status: DC

## 2015-04-11 NOTE — Progress Notes (Addendum)
  [facility] Adult Case Management Discharge Plan :  Will you be returning to the same living situation after discharge:  No. Going to stay with sister At discharge, do you have transportation home?: Yes,  sister Do you have the ability to pay for your medications: Yes,  Family Services  Release of information consent forms completed and in the chart;  Patient's signature needed at discharge.  Patient to Follow up at: Follow-up Information    Follow up with [facility] [place] On 04/16/2015.   Why:  Monday at 2:30 with Dr Rowe Robert.  Also, resume your ususal groups when you leave the hospital.   Contact information:   [place] [phone]      Patient denies SI/HI: Yes,  yes    Safety Planning and Suicide Prevention discussed: Yes,  yes  Have you used any form of tobacco in the last 30 days? (Cigarettes, Smokeless Tobacco, Cigars, and/or Pipes): Yes  Has patient been referred to the Quitline?: Yes, faxed on 04/11/15  [name] 04/11/2015, 12:24 PM

## 2015-04-11 NOTE — Progress Notes (Signed)
D: Patient alert and oriented x 4. Patient denies SI/HI/AVH. Patient reports right heel pain 8/10. PRN pain med given with effective results.  A: Staff to monitor Q 15 mins for safety. Encouragement and support offered. Scheduled medications administered per orders. R: Patient remains safe on the unit. Patient attended group tonight. Patient visible on hte unit and interacting with peers. Patient taking administered medications.
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
## 2015-04-11 NOTE — BHH Suicide Risk Assessment (Signed)
[place] INPATIENT:  Family/Significant Other Suicide Prevention Education  Suicide Prevention Education:  Patient Refusal for Family/Significant Other Suicide Prevention Education: The patient Maureen Swanson has refused to provide written consent for family/significant other to be provided Family/Significant Other Suicide Prevention Education during admission and/or prior to discharge.  Physician notified.  [name] 04/11/2015, 12:23 PM

## 2015-04-11 NOTE — Progress Notes (Addendum)
D:Pt behavior calm and cooperative on approach this morning. She voices no complaints. Denies SI/HI and denies AVH when asked by undersign. Per self inventory form pt reports she slept fair last night. She reports a fair appetite, low energy level, poor concentration. She rates depression 8/10, hopelessness 8/10, anxiety 8/10- all on 0-10 scale, 10 being the worse. She c/o chronic right knee pain. Pt reports her goal is "stop hearing voices" pt reports she will "talk about it" to help meet her goal. Pt reports passive SI and AH per self inventory form. Pt reports she is ready for discharge.   A: : When asked about information shared on self inventory form pt reports that she is not having thoughts of self harm "I checked the wrong box." Pt also denies hearing voices. Special checks q 15 mins in place for safety. Medication administered per MD order(see eMAR). Encouragement and support provided. Discharge planning in place. MD notified of above situation.  R:Compliant with medication regimen. Safety maintained, will continue to monitor.

## 2015-04-11 NOTE — BHH Suicide Risk Assessment (Addendum)
[facility] Discharge Suicide Risk Assessment   Demographic Factors:  Divorced or widowed  Total Time spent with patient: 30 minutes  Musculoskeletal: Strength & Muscle Tone: within normal limits Gait & Station: normal Patient leans: N/A  Psychiatric Specialty Exam: Physical Exam  Review of Systems  Psychiatric/Behavioral: Negative for hallucinations. The patient does not have insomnia.   All other systems reviewed and are negative.   Blood pressure 120/78, pulse 99, temperature 98.4 F (36.9 C), temperature source Oral, resp. rate 18, height 5\' 5"  (1.651 m), weight 105.688 kg (233 lb).Body mass index is 38.77 kg/(m^2).  General Appearance: Casual  Eye Contact::  Fair  Speech:  Clear and [id number]  Volume:  Normal  Mood:  Euthymic  Affect:  Appropriate  Thought Process:  Coherent  Orientation:  Full (Time, Place, and Person)  Thought Content:  WDL  Suicidal Thoughts:  No  Homicidal Thoughts:  No  Memory:  Immediate;   Fair Recent;   Fair Remote;   Fair  Judgement:  Fair  Insight:  Fair  Psychomotor Activity:  Normal  Concentration:  Fair  Recall:  [employer] of Knowledge:Fair  Language: Fair  Akathisia:  No  Handed:  Right  AIMS (if indicated):     Assets:  Communication Skills Desire for Improvement  Sleep:  Number of Hours: 6.25  Cognition: WNL  ADL's:  Intact   Have you used any form of tobacco in the last 30 days? (Cigarettes, Smokeless Tobacco, Cigars, and/or Pipes): Yes  Has this patient used any form of tobacco in the last 30 days? (Cigarettes, Smokeless Tobacco, Cigars, and/or Pipes) Yes, A prescription for nicotine patch provided.  Mental Status Per Nursing Assessment::   On Admission:     Current Mental Status by Physician: pt denies SI/HI/AH/VH  Loss Factors: Loss of significant relationship  Historical Factors: Impulsivity  Risk Reduction Factors:   Positive social support  Continued Clinical Symptoms:  Alcohol/Substance  Abuse/Dependencies Previous Psychiatric Diagnoses and Treatments  Cognitive Features That Contribute To Risk:  None    Suicide Risk:  Minimal: No identifiable suicidal ideation.  Patients presenting with no risk factors but with morbid ruminations; may be classified as minimal risk based on the severity of the depressive symptoms  Principal Problem: Schizoaffective disorder, bipolar type [facility]) Discharge Diagnoses:  Patient Active Problem List   Diagnosis Date Noted  . Cocaine use disorder, moderate, in early remission [F14.90] 04/03/2015  . Schizoaffective disorder, bipolar type ([place]) [F25.0] 04/03/2015  . Diabetes ([place]) [E11.9] 04/03/2015  . Alcohol use disorder, moderate, dependence ([place]) [F10.20] 04/03/2015  . Conversion disorder [F44.9] 12/09/2014  . Acute ischemic stroke ([place]) [I63.9] 12/08/2014  . Left-sided weakness [M62.89] 12/08/2014  . 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 Knee pain [M25.569] 10/17/2010  . HYPERGLYCEMIA [R73.09] 08/16/2010  . MAMMOGRAM, ABNORMAL, RIGHT [R92.8] 08/05/2010  . HYPERLIPIDEMIA [E78.5] 06/21/2010  . AMENORRHEA, SECONDARY [N91.2] 10/29/2009  . 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 HELICOBACTER PYLORI INFECTION [A04.8] 04/02/2009  . GERD [K21.9] 10/11/2007  . Essential hypertension [I10] 02/26/2007    Follow-up Information    Follow up with [facility] the [place] On 04/16/2015.   Why:  Monday at 2:30 with Dr Rowe Robert.  Also, resume your ususal groups when you leave the hospital.   Contact information:   [place] recommendations:  Activity:  No restriction Diet:  CARB MODIFIED Tests:  as needed Other:  follow up with after care  Is patient on multiple antipsychotic therapies at discharge:  Yes,   Do you recommend tapering to monotherapy for antipsychotics?  Yes   Has Patient had three or more failed trials of antipsychotic monotherapy by  history:  No  Recommended Plan for Multiple Antipsychotic Therapies: Taper to monotherapy as described:  seroquel to be tapered off by out patient psychiatrist    Terryl Niziolek MD 04/11/2015, 9:17 AM

## 2015-04-11 NOTE — Progress Notes (Addendum)
Discharge note : Pt discharged per MD order. Discharge summary reviewed with pt. Pt verbalizes and signs understanding of discharge summary. Pt expresses understanding of medication regimen and follow up appointment. RX and sample medication given to pt at discharge. Pt denies SI/HI. Denies AVH at discharge. All personal items returned to pt from locker # 40. Pt signs and verbalizes that she received all personal items. Ambulatory out of facility to lobby. Pt sister in lobby for discharge.

## 2015-04-13 LAB — GLUCOSE, CAPILLARY: GLUCOSE-CAPILLARY: 144 mg/dL — AB (ref 65–99)

## 2015-05-09 ENCOUNTER — Telehealth (HOSPITAL_COMMUNITY): Payer: Self-pay | Admitting: *Deleted

## 2015-05-09 NOTE — Telephone Encounter (Signed)
Telephoned patient at home # and left message to return call to [facility] 

## 2015-05-17 ENCOUNTER — Telehealth (HOSPITAL_COMMUNITY): Payer: Self-pay | Admitting: *Deleted

## 2015-05-17 NOTE — Telephone Encounter (Signed)
Patient returned call to [facility]. Patient stated she is having concerns of BV. Let patient know her Pap smear was normal in June but due to her history of abnormal Pap smears she will need a Pap smear in 1 year. Told patient [facility]. Let her know [facility] doesn't cover the wet prep that we only offer when completing Pap smears and observe discharge or symptoms that needs further testing. Patient verbalized understanding.

## 2015-05-21 ENCOUNTER — Ambulatory Visit: Payer: Self-pay

## 2015-07-18 ENCOUNTER — Encounter (HOSPITAL_COMMUNITY): Payer: Self-pay

## 2015-07-18 ENCOUNTER — Emergency Department (HOSPITAL_COMMUNITY)
Admission: EM | Admit: 2015-07-18 | Discharge: 2015-07-19 | Disposition: A | Discharge status: Other Acute Inpatient Hospital | Payer: Self-pay | Attending: Emergency Medicine | Admitting: Emergency Medicine

## 2015-07-18 DIAGNOSIS — F17210 Nicotine dependence, cigarettes, uncomplicated: Secondary | ICD-10-CM | POA: Insufficient documentation

## 2015-07-18 DIAGNOSIS — F1410 Cocaine abuse, uncomplicated: Secondary | ICD-10-CM | POA: Insufficient documentation

## 2015-07-18 DIAGNOSIS — Z3202 Encounter for pregnancy test, result negative: Secondary | ICD-10-CM | POA: Insufficient documentation

## 2015-07-18 DIAGNOSIS — Z8742 Personal history of other diseases of the female genital tract: Secondary | ICD-10-CM | POA: Insufficient documentation

## 2015-07-18 DIAGNOSIS — M170 Bilateral primary osteoarthritis of knee: Secondary | ICD-10-CM | POA: Insufficient documentation

## 2015-07-18 DIAGNOSIS — K219 Gastro-esophageal reflux disease without esophagitis: Secondary | ICD-10-CM | POA: Insufficient documentation

## 2015-07-18 DIAGNOSIS — Z7982 Long term (current) use of aspirin: Secondary | ICD-10-CM | POA: Insufficient documentation

## 2015-07-18 DIAGNOSIS — I10 Essential (primary) hypertension: Secondary | ICD-10-CM | POA: Insufficient documentation

## 2015-07-18 DIAGNOSIS — E7800 Pure hypercholesterolemia, unspecified: Secondary | ICD-10-CM | POA: Insufficient documentation

## 2015-07-18 DIAGNOSIS — G43909 Migraine, unspecified, not intractable, without status migrainosus: Secondary | ICD-10-CM | POA: Insufficient documentation

## 2015-07-18 DIAGNOSIS — Z880 Allergy status to penicillin: Secondary | ICD-10-CM | POA: Insufficient documentation

## 2015-07-18 DIAGNOSIS — M47896 Other spondylosis, lumbar region: Secondary | ICD-10-CM | POA: Insufficient documentation

## 2015-07-18 DIAGNOSIS — G8929 Other chronic pain: Secondary | ICD-10-CM | POA: Insufficient documentation

## 2015-07-18 DIAGNOSIS — Z79899 Other long term (current) drug therapy: Secondary | ICD-10-CM | POA: Insufficient documentation

## 2015-07-18 DIAGNOSIS — F419 Anxiety disorder, unspecified: Secondary | ICD-10-CM | POA: Insufficient documentation

## 2015-07-18 DIAGNOSIS — F319 Bipolar disorder, unspecified: Secondary | ICD-10-CM | POA: Insufficient documentation

## 2015-07-18 DIAGNOSIS — R208 Other disturbances of skin sensation: Secondary | ICD-10-CM | POA: Insufficient documentation

## 2015-07-18 DIAGNOSIS — R45851 Suicidal ideations: Secondary | ICD-10-CM | POA: Insufficient documentation

## 2015-07-18 DIAGNOSIS — F250 Schizoaffective disorder, bipolar type: Secondary | ICD-10-CM | POA: Diagnosis present

## 2015-07-18 DIAGNOSIS — M109 Gout, unspecified: Secondary | ICD-10-CM | POA: Insufficient documentation

## 2015-07-18 DIAGNOSIS — J45909 Unspecified asthma, uncomplicated: Secondary | ICD-10-CM | POA: Insufficient documentation

## 2015-07-18 DIAGNOSIS — E119 Type 2 diabetes mellitus without complications: Secondary | ICD-10-CM | POA: Insufficient documentation

## 2015-07-18 DIAGNOSIS — I509 Heart failure, unspecified: Secondary | ICD-10-CM | POA: Insufficient documentation

## 2015-07-18 DIAGNOSIS — Z7984 Long term (current) use of oral hypoglycemic drugs: Secondary | ICD-10-CM | POA: Insufficient documentation

## 2015-07-18 DIAGNOSIS — Z794 Long term (current) use of insulin: Secondary | ICD-10-CM | POA: Insufficient documentation

## 2015-07-18 HISTORY — DX: Heart failure, unspecified: I50.9

## 2015-07-18 LAB — COMPREHENSIVE METABOLIC PANEL
ALBUMIN: 4 g/dL (ref 3.5–5.0)
ALK PHOS: 79 U/L (ref 38–126)
ALT: 12 U/L — ABNORMAL LOW (ref 14–54)
ANION GAP: 12 (ref 5–15)
AST: 25 U/L (ref 15–41)
BUN: 19 mg/dL (ref 6–20)
CHLORIDE: 104 mmol/L (ref 101–111)
CO2: 24 mmol/L (ref 22–32)
Calcium: 8.9 mg/dL (ref 8.9–10.3)
Creatinine, Ser: 1.24 mg/dL — ABNORMAL HIGH (ref 0.44–1.00)
GFR calc non Af Amer: 50 mL/min — ABNORMAL LOW (ref >60)
GFR, EST AFRICAN AMERICAN: 58 mL/min — AB (ref >60)
GLUCOSE: 148 mg/dL — AB (ref 65–99)
POTASSIUM: 3.5 mmol/L (ref 3.5–5.1)
SODIUM: 140 mmol/L (ref 135–145)
Total Bilirubin: 0.5 mg/dL (ref 0.3–1.2)
Total Protein: 7.5 g/dL (ref 6.5–8.1)

## 2015-07-18 LAB — CBC
HEMATOCRIT: 37 % (ref 36.0–46.0)
HEMOGLOBIN: 11.4 g/dL — AB (ref 12.0–15.0)
MCH: 27.9 pg (ref 26.0–34.0)
MCHC: 30.8 g/dL (ref 30.0–36.0)
MCV: 90.5 fL (ref 78.0–100.0)
Platelets: 234 10*3/uL (ref 150–400)
RBC: 4.09 MIL/uL (ref 3.87–5.11)
RDW: 13.8 % (ref 11.5–15.5)
WBC: 10 10*3/uL (ref 4.0–10.5)

## 2015-07-18 LAB — SALICYLATE LEVEL

## 2015-07-18 LAB — ETHANOL: Alcohol, Ethyl (B): <5 mg/dL (ref <5)

## 2015-07-18 LAB — ACETAMINOPHEN LEVEL

## 2015-07-18 LAB — I-STAT BETA HCG BLOOD, ED (MC, WL, AP ONLY): I-stat hCG, quantitative: <5 m[IU]/mL (ref <5)

## 2015-07-18 LAB — RAPID URINE DRUG SCREEN, HOSP PERFORMED
Amphetamines: NOT DETECTED
BARBITURATES: NOT DETECTED
BENZODIAZEPINES: NOT DETECTED
COCAINE: POSITIVE — AB
OPIATES: NOT DETECTED
TETRAHYDROCANNABINOL: NOT DETECTED

## 2015-07-18 LAB — CBG MONITORING, ED
GLUCOSE-CAPILLARY: 137 mg/dL — AB (ref 65–99)
GLUCOSE-CAPILLARY: 67 mg/dL (ref 65–99)
Glucose-Capillary: 139 mg/dL — ABNORMAL HIGH (ref 65–99)

## 2015-07-18 MED ORDER — HYDROXYZINE HCL 25 MG PO TABS: 25.0000 mg | ORAL_TABLET | Freq: Four times a day (QID) | ORAL | Status: DC | PRN

## 2015-07-18 MED ORDER — POTASSIUM CHLORIDE CRYS ER 20 MEQ PO TBCR
20.0000 meq | EXTENDED_RELEASE_TABLET | Freq: Every day | ORAL | Status: DC
  Administered 2015-07-18 – 2015-07-19 (×2): 20 meq via ORAL
  Filled 2015-07-18 (×2): qty 1

## 2015-07-18 MED ORDER — NALTREXONE HCL 50 MG PO TABS
50.0000 mg | ORAL_TABLET | Freq: Every day | ORAL | Status: DC
  Administered 2015-07-18 – 2015-07-19 (×2): 50 mg via ORAL
  Filled 2015-07-18 (×2): qty 1

## 2015-07-18 MED ORDER — LORATADINE 10 MG PO TABS
10.0000 mg | ORAL_TABLET | Freq: Every day | ORAL | Status: DC
  Administered 2015-07-18 – 2015-07-19 (×2): 10 mg via ORAL
  Filled 2015-07-18 (×2): qty 1

## 2015-07-18 MED ORDER — ATENOLOL 100 MG PO TABS
100.0000 mg | ORAL_TABLET | Freq: Every day | ORAL | Status: DC
  Administered 2015-07-18 – 2015-07-19 (×2): 100 mg via ORAL
  Filled 2015-07-18 (×2): qty 1

## 2015-07-18 MED ORDER — CALCIUM CARBONATE 1250 (500 CA) MG PO TABS
1.0000 | ORAL_TABLET | Freq: Every day | ORAL | Status: DC
  Administered 2015-07-19: 500 mg via ORAL
  Filled 2015-07-18 (×2): qty 1

## 2015-07-18 MED ORDER — CELECOXIB 100 MG PO CAPS
100.0000 mg | ORAL_CAPSULE | Freq: Two times a day (BID) | ORAL | Status: DC
  Administered 2015-07-18 – 2015-07-19 (×2): 100 mg via ORAL
  Filled 2015-07-18 (×3): qty 1

## 2015-07-18 MED ORDER — ASPIRIN 81 MG PO CHEW
81.0000 mg | CHEWABLE_TABLET | Freq: Every day | ORAL | Status: DC
  Administered 2015-07-18 – 2015-07-19 (×2): 81 mg via ORAL
  Filled 2015-07-18 (×2): qty 1

## 2015-07-18 MED ORDER — FOLIC ACID 1 MG PO TABS
1.0000 mg | ORAL_TABLET | Freq: Every day | ORAL | Status: DC
  Administered 2015-07-18 – 2015-07-19 (×2): 1 mg via ORAL
  Filled 2015-07-18 (×2): qty 1

## 2015-07-18 MED ORDER — OXCARBAZEPINE 300 MG PO TABS
600.0000 mg | ORAL_TABLET | Freq: Every day | ORAL | Status: DC
  Administered 2015-07-18 – 2015-07-19 (×2): 600 mg via ORAL
  Filled 2015-07-18 (×2): qty 2

## 2015-07-18 MED ORDER — BENZTROPINE MESYLATE 1 MG PO TABS
0.5000 mg | ORAL_TABLET | Freq: Two times a day (BID) | ORAL | Status: DC
  Administered 2015-07-18 – 2015-07-19 (×2): 0.5 mg via ORAL
  Filled 2015-07-18 (×2): qty 1

## 2015-07-18 MED ORDER — QUETIAPINE FUMARATE ER 300 MG PO TB24
300.0000 mg | ORAL_TABLET | Freq: Every day | ORAL | Status: DC
  Administered 2015-07-18: 300 mg via ORAL
  Filled 2015-07-18 (×2): qty 1

## 2015-07-18 MED ORDER — BACLOFEN 10 MG PO TABS
10.0000 mg | ORAL_TABLET | Freq: Three times a day (TID) | ORAL | Status: DC
  Administered 2015-07-18 – 2015-07-19 (×4): 10 mg via ORAL
  Filled 2015-07-18 (×5): qty 1

## 2015-07-18 MED ORDER — INSULIN GLARGINE 100 UNIT/ML ~~LOC~~ SOLN
30.0000 [IU] | Freq: Every day | SUBCUTANEOUS | Status: DC
  Filled 2015-07-18 (×2): qty 0.3

## 2015-07-18 MED ORDER — LINAGLIPTIN 5 MG PO TABS
5.0000 mg | ORAL_TABLET | Freq: Every day | ORAL | Status: DC
Start: 2015-07-18 — End: 2015-07-19
  Administered 2015-07-18 – 2015-07-19 (×2): 5 mg via ORAL
  Filled 2015-07-18 (×2): qty 1

## 2015-07-18 MED ORDER — INSULIN ASPART 100 UNIT/ML ~~LOC~~ SOLN
0.0000 [IU] | Freq: Three times a day (TID) | SUBCUTANEOUS | Status: DC
  Filled 2015-07-18: qty 1

## 2015-07-18 MED ORDER — ATORVASTATIN CALCIUM 40 MG PO TABS
40.0000 mg | ORAL_TABLET | Freq: Every evening | ORAL | Status: DC
  Administered 2015-07-18: 40 mg via ORAL
  Filled 2015-07-18 (×2): qty 1

## 2015-07-18 MED ORDER — LAMOTRIGINE 25 MG PO TABS
25.0000 mg | ORAL_TABLET | Freq: Two times a day (BID) | ORAL | Status: DC
  Administered 2015-07-18 – 2015-07-19 (×2): 25 mg via ORAL
  Filled 2015-07-18 (×3): qty 1

## 2015-07-18 MED ORDER — METFORMIN HCL 500 MG PO TABS
1000.0000 mg | ORAL_TABLET | Freq: Two times a day (BID) | ORAL | Status: DC
  Administered 2015-07-18 – 2015-07-19 (×3): 1000 mg via ORAL
  Filled 2015-07-18 (×4): qty 2

## 2015-07-18 MED ORDER — PANTOPRAZOLE SODIUM 40 MG PO TBEC
40.0000 mg | DELAYED_RELEASE_TABLET | Freq: Every day | ORAL | Status: DC
  Administered 2015-07-18 – 2015-07-19 (×2): 40 mg via ORAL
  Filled 2015-07-18 (×2): qty 1

## 2015-07-18 MED ORDER — GABAPENTIN 400 MG PO CAPS
400.0000 mg | ORAL_CAPSULE | Freq: Two times a day (BID) | ORAL | Status: DC
  Administered 2015-07-18 – 2015-07-19 (×2): 400 mg via ORAL
  Filled 2015-07-18 (×2): qty 1

## 2015-07-18 MED ORDER — METOCLOPRAMIDE HCL 10 MG PO TABS
10.0000 mg | ORAL_TABLET | Freq: Three times a day (TID) | ORAL | Status: DC
  Administered 2015-07-18 – 2015-07-19 (×4): 10 mg via ORAL
  Filled 2015-07-18 (×4): qty 1

## 2015-07-18 MED ORDER — HYDROCHLOROTHIAZIDE 25 MG PO TABS
25.0000 mg | ORAL_TABLET | Freq: Every day | ORAL | Status: DC
  Administered 2015-07-18 – 2015-07-19 (×2): 25 mg via ORAL
  Filled 2015-07-18 (×2): qty 1

## 2015-07-18 MED ORDER — TRAZODONE HCL 50 MG PO TABS
150.0000 mg | ORAL_TABLET | Freq: Every day | ORAL | Status: DC
  Administered 2015-07-18: 150 mg via ORAL
  Filled 2015-07-18: qty 1

## 2015-07-18 MED ORDER — ADULT MULTIVITAMIN W/MINERALS CH
1.0000 | ORAL_TABLET | Freq: Every day | ORAL | Status: DC
  Administered 2015-07-18 – 2015-07-19 (×2): 1 via ORAL
  Filled 2015-07-18 (×2): qty 1

## 2015-07-18 MED ORDER — HALOPERIDOL 5 MG PO TABS
5.0000 mg | ORAL_TABLET | Freq: Every day | ORAL | Status: DC
Start: 2015-07-19 — End: 2015-07-19
  Administered 2015-07-19: 5 mg via ORAL
  Filled 2015-07-18: qty 1

## 2015-07-18 MED ORDER — NICOTINE 21 MG/24HR TD PT24
21.0000 mg | MEDICATED_PATCH | Freq: Every day | TRANSDERMAL | Status: DC
  Administered 2015-07-18 – 2015-07-19 (×2): 21 mg via TRANSDERMAL
  Filled 2015-07-18 (×2): qty 1

## 2015-07-18 MED ORDER — AMLODIPINE BESYLATE 5 MG PO TABS
5.0000 mg | ORAL_TABLET | Freq: Every day | ORAL | Status: DC
  Administered 2015-07-18 – 2015-07-19 (×2): 5 mg via ORAL
  Filled 2015-07-18 (×2): qty 1

## 2015-07-18 MED ORDER — HALOPERIDOL 5 MG PO TABS
10.0000 mg | ORAL_TABLET | Freq: Every evening | ORAL | Status: DC
  Administered 2015-07-18: 10 mg via ORAL
  Filled 2015-07-18: qty 2

## 2015-07-18 MED ORDER — FUROSEMIDE 40 MG PO TABS
40.0000 mg | ORAL_TABLET | Freq: Every day | ORAL | Status: DC
  Administered 2015-07-18 – 2015-07-19 (×2): 40 mg via ORAL
  Filled 2015-07-18 (×2): qty 1
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
## 2015-07-18 NOTE — ED Notes (Signed)
All belongings taken to [facility]. 

## 2015-07-18 NOTE — ED Notes (Signed)
Pt presents w/ [facility] after being IVC'd by [employer].  Per IVC paperwork, "respondent presents with increased depression and suicidal thoughts.  Planning on overdose on her current medications (seroquel, trileptal, trazodone).  Has history of suicide attempts.  Has been missing her medication doses and currently, unable to contract for safety.  Respondent is dangerous to herself."     Beverly Sessions reports that the Pt is insulin dependent and requires oxygen for sleep apnea.

## 2015-07-18 NOTE — ED Notes (Signed)
Pt has been changed and wanded by [employer].

## 2015-07-18 NOTE — Progress Notes (Addendum)
Patient meets IP criteria, per NP Charmaine Downs.  Under review at [facility].  Referrals have been sent to: [place]  At capacity: [place], [place] Disposition staff 07/18/2015 8:47 PM

## 2015-07-18 NOTE — ED Notes (Addendum)
Pt reports SI w/ plan to cut herself or OD, HI towards ex-boyfriend, and auditory hallucinations that "just talk" x 2 months.  Sts she had a housing change x 2 months ago and has not taken medications x 2 months.  Pt reports that she sleeps w/ cpap and "it is over at a friend's house where I'm staying."        Further, Pt c/o bilateral foot pain x 3 months.  Pain score 10/10.  Sts "I think, it's my neuropathy."

## 2015-07-18 NOTE — ED Provider Notes (Signed)
CSN: [id number]     Arrival date & time 07/18/15  1432 History   First MD Initiated Contact with Patient 07/18/15 1527     Chief Complaint  Patient presents with  . IVC    . Suicidal  . Foot Pain     (Consider location/radiation/quality/duration/timing/severity/associated sxs/prior Treatment) HPI Patient presents as a transfer from our affiliated behavioral health facility. Patient has history of suicidal ideation, depression, now states that the past 2 months she has felt increasingly depressed and despondent, with thoughts of killing herself. Patient has ongoing chronic bilateral foot pain, no notable changes. Patient denies any suicide attempt within the past days.  Past Medical History  Diagnosis Date  . High cholesterol   . Depression   . Gout   . Anxiety   . Bipolar 1 disorder ([place])   . GERD (gastroesophageal reflux disease)   . Substance abuse     crack cocaine  . Headache(784.0)     migraines  . TMJ (temporomandibular joint disorder)   . Asthma     daily and prn inhalers  . Arthritis     back and knees  . Gastric ulcer   . 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 Hypertension     under control, has been on med. x 12 yrs.  . Diabetes mellitus     diet-controlled  . CHF (congestive heart failure) [facility])    Past Surgical History  Procedure Laterality Date  . Knee arthroscopy w/ partial medial meniscectomy  05/01/2010    right  . Breast lumpectomy with needle localization  04/19/2012    Procedure: BREAST LUMPECTOMY WITH NEEDLE LOCALIZATION;  Surgeon: [name], MD;  Location: [place];  Service: General;  Laterality: Right;   Family History  Problem Relation Age of Onset  . Diabetes Mother   . Breast cancer Mother   . Cancer Father   . Bipolar disorder Maternal Aunt    Social History  Substance Use Topics  . Smoking status: Current Every Day Smoker -- 0.50 packs/day for 15 years    Types: Cigarettes    Last Attempt  to Quit: 08/02/2012  . Smokeless tobacco: Never Used  . Alcohol Use: 0.0 oz/week     Comment: reports she drinks 3 40 ounce beers daily   OB History    Gravida Para Term Preterm AB TAB SAB Ectopic Multiple Living   3 2 2  1  1   2      Review of Systems  Constitutional:       Per HPI, otherwise negative  HENT:       Per HPI, otherwise negative  Respiratory:       Per HPI, otherwise negative  Cardiovascular:       Per HPI, otherwise negative  Gastrointestinal: Negative for vomiting.  Endocrine:       Negative aside from HPI  Genitourinary:       Neg aside from HPI   Musculoskeletal:       Per HPI, otherwise negative  Skin: Negative.   Neurological: Negative for syncope.       Persistent sharp discomfort in both posterior heels  Psychiatric/Behavioral: Positive for suicidal ideas, dysphoric mood and decreased concentration.      Allergies  Chocolate; Orange; Penicillins; and Tomato  Home Medications   Prior to Admission medications   Medication Sig Start Date End Date Taking? Authorizing Provider  albuterol (PROVENTIL HFA;VENTOLIN HFA) 108 (90 BASE) MCG/ACT inhaler  Inhale 2 puffs into the lungs every 6 (six) hours as needed for wheezing or shortness of breath. 03/02/14  Yes Encarnacion Slates, NP  amLODipine (NORVASC) 5 MG tablet Take 1 tablet (5 mg total) by mouth daily. 04/11/15  Yes Benjamine Mola, FNP  aspirin 81 MG tablet Take 1 tablet (81 mg total) by mouth daily. 04/11/15  Yes Benjamine Mola, FNP  atenolol (TENORMIN) 100 MG tablet Take 1 tablet (100 mg total) by mouth daily. 04/11/15  Yes Benjamine Mola, FNP  atorvastatin (LIPITOR) 40 MG tablet Take 1 tablet (40 mg total) by mouth every evening. 04/11/15  Yes Benjamine Mola, FNP  baclofen (LIORESAL) 10 MG tablet Take 1 tablet (10 mg total) by mouth 3 (three) times daily. 04/11/15  Yes Benjamine Mola, FNP  benztropine (COGENTIN) 0.5 MG tablet Take 1 tablet (0.5 mg total) by mouth 2 (two) times daily. 04/11/15  Yes Benjamine Mola, FNP  calcium carbonate (OS-CAL - DOSED IN MG OF ELEMENTAL CALCIUM) 1250 (500 CA) MG tablet Take 1 tablet (500 mg of elemental calcium total) by mouth daily with breakfast. 04/11/15  Yes Benjamine Mola, FNP  celecoxib (CELEBREX) 100 MG capsule Take 1 capsule (100 mg total) by mouth 2 (two) times daily. 04/11/15  Yes Benjamine Mola, FNP  folic acid (FOLVITE) 1 MG tablet Take 1 tablet (1 mg total) by mouth daily. 04/11/15  Yes Benjamine Mola, FNP  furosemide (LASIX) 40 MG tablet Take 1 tablet (40 mg total) by mouth daily. 04/11/15  Yes Benjamine Mola, FNP  gabapentin (NEURONTIN) 400 MG capsule Take 1 capsule (400 mg total) by mouth 2 (two) times daily. 04/11/15  Yes Benjamine Mola, FNP  haloperidol (HALDOL) 10 MG tablet Take 1 tablet (10 mg total) by mouth every evening. 04/11/15  Yes Benjamine Mola, FNP  haloperidol (HALDOL) 5 MG tablet Take 1 tablet (5 mg total) by mouth daily with breakfast. 04/11/15  Yes Benjamine Mola, FNP  hydrochlorothiazide (HYDRODIURIL) 25 MG tablet Take 1 tablet (25 mg total) by mouth daily. 04/11/15  Yes Benjamine Mola, FNP  hydrOXYzine (ATARAX/VISTARIL) 25 MG tablet Take 1 tablet (25 mg total) by mouth every 6 (six) hours as needed for anxiety. 04/11/15  Yes Benjamine Mola, FNP  insulin aspart (NOVOLOG) 100 UNIT/ML injection Inject 0-15 Units into the skin 3 (three) times daily with meals. Use Sliding scale given to you by [facility] Nurse 04/11/15  Yes Benjamine Mola, FNP  insulin glargine (LANTUS) 100 UNIT/ML injection Inject 0.3 mLs (30 Units total) into the skin at bedtime. To be managed by your outpatient provider 04/11/15  Yes Benjamine Mola, FNP  lamoTRIgine (LAMICTAL) 25 MG tablet Take 1 tablet (25 mg total) by mouth 2 (two) times daily. 04/11/15  Yes Benjamine Mola, FNP  metFORMIN (GLUCOPHAGE) 1000 MG tablet Take 1 tablet (1,000 mg total) by mouth 2 (two) times daily with a meal. 04/11/15  Yes Benjamine Mola, FNP  metoCLOPramide (REGLAN) 10 MG tablet Take 1 tablet  (10 mg total) by mouth 4 (four) times daily -  before meals and at bedtime. 04/11/15  Yes Benjamine Mola, FNP  Multiple Vitamin (MULTIVITAMIN WITH MINERALS) TABS tablet Take 1 tablet by mouth daily. 04/11/15  Yes Benjamine Mola, FNP  naltrexone (DEPADE) 50 MG tablet Take 1 tablet (50 mg total) by mouth daily. 04/11/15  Yes Benjamine Mola, FNP  nicotine (NICODERM CQ - DOSED IN MG/24 HOURS) 21  mg/24hr patch Place 1 patch (21 mg total) onto the skin daily. 04/11/15  Yes Benjamine Mola, FNP  omeprazole (PRILOSEC) 20 MG capsule Take 1 capsule (20 mg total) by mouth daily. For GERD 08/02/14  Yes Shuvon B Rankin, NP  Oxcarbazepine (TRILEPTAL) 600 MG tablet Take 1 tablet (600 mg total) by mouth daily. 04/11/15  Yes Benjamine Mola, FNP  potassium chloride SA (K-DUR,KLOR-CON) 20 MEQ tablet Take 1 tablet (20 mEq total) by mouth daily. 04/11/15  Yes Benjamine Mola, FNP  QUEtiapine (SEROQUEL XR) 300 MG 24 hr tablet Take 1 tablet (300 mg total) by mouth daily at 8 pm. 04/11/15  Yes Benjamine Mola, FNP  sitaGLIPtin (JANUVIA) 50 MG tablet Take 1 tablet (50 mg total) by mouth daily. 04/11/15  Yes Benjamine Mola, FNP  traZODone (DESYREL) 150 MG tablet Take 1 tablet (150 mg total) by mouth at bedtime. 04/11/15  Yes Benjamine Mola, FNP  cetirizine (ZYRTEC) 10 MG tablet Take 10 mg by mouth daily as needed for allergies.     Historical Provider, MD   BP 96/66 mmHg  Pulse 78  Temp(Src) 98.2 F (36.8 C) (Oral)  Resp 16  SpO2 95%  LMP  (Approximate) Physical Exam  Constitutional: She is oriented to person, place, and time. She appears well-developed and well-nourished. No distress.  HENT:  Head: Normocephalic and atraumatic.  Eyes: Conjunctivae and EOM are normal.  Cardiovascular: Normal rate and regular rhythm.   Pulmonary/Chest: Effort normal and breath sounds normal. No stridor. No respiratory distress.  Abdominal: She exhibits no distension.  Musculoskeletal: She exhibits no edema.  Neurological: She is alert  and oriented to person, place, and time. No cranial nerve deficit.  Patient describes dysesthesia in both heels, but has appropriate sensation, has appropriate Achilles function, has sensation throughout the feet.   Skin: Skin is warm and dry.  Psychiatric: She has a normal mood and affect. Cognition and memory are not impaired. She expresses suicidal ideation.  Nursing note and vitals reviewed.   ED Course  Procedures (including critical care time) Labs Review Labs Reviewed  COMPREHENSIVE METABOLIC PANEL - Abnormal; Notable for the following:    Glucose, Bld 148 (*)    Creatinine, Ser 1.24 (*)    ALT 12 (*)    GFR calc non Af Amer 50 (*)    GFR calc Af Amer 58 (*)    All other components within normal limits  ACETAMINOPHEN LEVEL - Abnormal; Notable for the following:    Acetaminophen (Tylenol), Serum <10 (*)    All other components within normal limits  CBC - Abnormal; Notable for the following:    Hemoglobin 11.4 (*)    All other components within normal limits  URINE RAPID DRUG SCREEN, HOSP PERFORMED - Abnormal; Notable for the following:    Cocaine POSITIVE (*)    All other components within normal limits  CBG MONITORING, ED - Abnormal; Notable for the following:    Glucose-Capillary 137 (*)    All other components within normal limits  ETHANOL  SALICYLATE LEVEL  I-STAT BETA HCG BLOOD, ED ([facility], [facility], [facility] ONLY)    Imaging Review No results found. I have personally reviewed and evaluated these images and lab results as part of my medical decision-making.   EKG Interpretation None      MDM  Patient presents from [place], after presenting there due to increasing depression, suicidal thoughts. The patient is awake, alert. Patient is complaining of pain in the bilateral heels, but  there is no evidence for neurovascular compromise. Symptoms likely musculoskeletal versus neuropathy. Patient is medically clear for psychiatric evaluation.   Carmin Muskrat, MD 07/18/15  [phone]

## 2015-07-18 NOTE — Progress Notes (Addendum)
Pt ambulatory to [facility] with a steady gait. Admitted with Mood disorder. Pt presents with flat affect and depressed mood. Reports SI with plan to overdose on medications, verbally contracts for safety. Pt also reported AH, states "the voices are telling me to kill myself and others (HI towards ex-boyfriend) and that's why I came in". Pt has been compliant with medications when offered. Vitals WNL. Unit routines reviewed and understanding verbalized. Pt informed that due to her CPAP use at night she will have to be transferred to the main ED at bedtime and she was in agreement with it. Decision was confirmed by [facility] prior to pt transfer to [facility]. Support, availability and encouragement provided. Safety maintained on Q 15 minutes checks as ordered.

## 2015-07-18 NOTE — BH Assessment (Signed)
Per Reginold Agent, - patient meets criteria for inpatient hospitalization.  Per Otila Kluver [facility]) no appropriate beds at [facility].  TTS will seek placement.

## 2015-07-19 ENCOUNTER — Inpatient Hospital Stay (HOSPITAL_COMMUNITY)
Admission: AD | Admit: 2015-07-19 | Discharge: 2015-08-02 | DRG: 885 | Disposition: A | Discharge status: Home / Self Care | Payer: Medicaid Other | Attending: Psychiatry | Admitting: Psychiatry

## 2015-07-19 ENCOUNTER — Encounter (HOSPITAL_COMMUNITY): Payer: Self-pay | Admitting: *Deleted

## 2015-07-19 DIAGNOSIS — F1420 Cocaine dependence, uncomplicated: Secondary | ICD-10-CM | POA: Diagnosis present

## 2015-07-19 DIAGNOSIS — Z79899 Other long term (current) drug therapy: Secondary | ICD-10-CM

## 2015-07-19 DIAGNOSIS — Z62810 Personal history of physical and sexual abuse in childhood: Secondary | ICD-10-CM | POA: Diagnosis not present

## 2015-07-19 DIAGNOSIS — Z833 Family history of diabetes mellitus: Secondary | ICD-10-CM | POA: Diagnosis not present

## 2015-07-19 DIAGNOSIS — J45909 Unspecified asthma, uncomplicated: Secondary | ICD-10-CM | POA: Diagnosis present

## 2015-07-19 DIAGNOSIS — R45851 Suicidal ideations: Secondary | ICD-10-CM | POA: Diagnosis present

## 2015-07-19 DIAGNOSIS — Z7982 Long term (current) use of aspirin: Secondary | ICD-10-CM

## 2015-07-19 DIAGNOSIS — E119 Type 2 diabetes mellitus without complications: Secondary | ICD-10-CM

## 2015-07-19 DIAGNOSIS — Z23 Encounter for immunization: Secondary | ICD-10-CM

## 2015-07-19 DIAGNOSIS — Z794 Long term (current) use of insulin: Secondary | ICD-10-CM

## 2015-07-19 DIAGNOSIS — G8929 Other chronic pain: Secondary | ICD-10-CM | POA: Diagnosis present

## 2015-07-19 DIAGNOSIS — I110 Hypertensive heart disease with heart failure: Secondary | ICD-10-CM | POA: Diagnosis present

## 2015-07-19 DIAGNOSIS — E785 Hyperlipidemia, unspecified: Secondary | ICD-10-CM | POA: Diagnosis present

## 2015-07-19 DIAGNOSIS — G4700 Insomnia, unspecified: Secondary | ICD-10-CM | POA: Diagnosis present

## 2015-07-19 DIAGNOSIS — F250 Schizoaffective disorder, bipolar type: Secondary | ICD-10-CM | POA: Diagnosis not present

## 2015-07-19 DIAGNOSIS — I509 Heart failure, unspecified: Secondary | ICD-10-CM | POA: Diagnosis present

## 2015-07-19 DIAGNOSIS — Z803 Family history of malignant neoplasm of breast: Secondary | ICD-10-CM

## 2015-07-19 DIAGNOSIS — F1020 Alcohol dependence, uncomplicated: Secondary | ICD-10-CM | POA: Diagnosis not present

## 2015-07-19 DIAGNOSIS — Z8673 Personal history of transient ischemic attack (TIA), and cerebral infarction without residual deficits: Secondary | ICD-10-CM

## 2015-07-19 DIAGNOSIS — F419 Anxiety disorder, unspecified: Secondary | ICD-10-CM | POA: Diagnosis present

## 2015-07-19 DIAGNOSIS — K219 Gastro-esophageal reflux disease without esophagitis: Secondary | ICD-10-CM | POA: Diagnosis not present

## 2015-07-19 DIAGNOSIS — E7800 Pure hypercholesterolemia, unspecified: Secondary | ICD-10-CM | POA: Diagnosis present

## 2015-07-19 DIAGNOSIS — R110 Nausea: Secondary | ICD-10-CM | POA: Diagnosis present

## 2015-07-19 DIAGNOSIS — F17200 Nicotine dependence, unspecified, uncomplicated: Secondary | ICD-10-CM | POA: Diagnosis not present

## 2015-07-19 DIAGNOSIS — R45850 Homicidal ideations: Secondary | ICD-10-CM

## 2015-07-19 DIAGNOSIS — Z818 Family history of other mental and behavioral disorders: Secondary | ICD-10-CM | POA: Diagnosis not present

## 2015-07-19 DIAGNOSIS — I10 Essential (primary) hypertension: Secondary | ICD-10-CM | POA: Diagnosis present

## 2015-07-19 DIAGNOSIS — F17210 Nicotine dependence, cigarettes, uncomplicated: Secondary | ICD-10-CM | POA: Diagnosis present

## 2015-07-19 LAB — CBG MONITORING, ED
GLUCOSE-CAPILLARY: 100 mg/dL — AB (ref 65–99)
GLUCOSE-CAPILLARY: 121 mg/dL — AB (ref 65–99)
Glucose-Capillary: 118 mg/dL — ABNORMAL HIGH (ref 65–99)

## 2015-07-19 LAB — GLUCOSE, CAPILLARY: GLUCOSE-CAPILLARY: 110 mg/dL — AB (ref 65–99)

## 2015-07-19 MED ORDER — POTASSIUM CHLORIDE CRYS ER 20 MEQ PO TBCR
20.0000 meq | EXTENDED_RELEASE_TABLET | Freq: Every day | ORAL | Status: DC
  Administered 2015-07-20 – 2015-08-02 (×14): 20 meq via ORAL
  Filled 2015-07-19 (×3): qty 1
  Filled 2015-07-19: qty 7
  Filled 2015-07-19 (×12): qty 1

## 2015-07-19 MED ORDER — NICOTINE 21 MG/24HR TD PT24
21.0000 mg | MEDICATED_PATCH | Freq: Every day | TRANSDERMAL | Status: DC
  Administered 2015-07-20 – 2015-08-02 (×11): 21 mg via TRANSDERMAL
  Filled 2015-07-19 (×17): qty 1

## 2015-07-19 MED ORDER — BACLOFEN 10 MG PO TABS
10.0000 mg | ORAL_TABLET | Freq: Three times a day (TID) | ORAL | Status: DC
  Administered 2015-07-20 – 2015-08-02 (×39): 10 mg via ORAL
  Filled 2015-07-19: qty 21
  Filled 2015-07-19 (×13): qty 1
  Filled 2015-07-19: qty 21
  Filled 2015-07-19 (×6): qty 1
  Filled 2015-07-19: qty 21
  Filled 2015-07-19 (×24): qty 1

## 2015-07-19 MED ORDER — METFORMIN HCL 500 MG PO TABS
1000.0000 mg | ORAL_TABLET | Freq: Two times a day (BID) | ORAL | Status: DC
  Administered 2015-07-20 – 2015-08-02 (×27): 1000 mg via ORAL
  Filled 2015-07-19 (×11): qty 2
  Filled 2015-07-19: qty 26
  Filled 2015-07-19 (×12): qty 2
  Filled 2015-07-19: qty 26
  Filled 2015-07-19 (×6): qty 2

## 2015-07-19 MED ORDER — LAMOTRIGINE 25 MG PO TABS
25.0000 mg | ORAL_TABLET | Freq: Two times a day (BID) | ORAL | Status: DC
  Administered 2015-07-19 – 2015-07-26 (×14): 25 mg via ORAL
  Filled 2015-07-19 (×19): qty 1

## 2015-07-19 MED ORDER — LINAGLIPTIN 5 MG PO TABS
5.0000 mg | ORAL_TABLET | Freq: Every day | ORAL | Status: DC
  Administered 2015-07-20 – 2015-08-02 (×14): 5 mg via ORAL
  Filled 2015-07-19 (×16): qty 1

## 2015-07-19 MED ORDER — CALCIUM CARBONATE 1250 (500 CA) MG PO TABS
1.0000 | ORAL_TABLET | Freq: Every day | ORAL | Status: DC
  Administered 2015-07-20 – 2015-08-02 (×14): 500 mg via ORAL
  Filled 2015-07-19 (×16): qty 1

## 2015-07-19 MED ORDER — INSULIN ASPART 100 UNIT/ML ~~LOC~~ SOLN
0.0000 [IU] | Freq: Three times a day (TID) | SUBCUTANEOUS | Status: DC
  Administered 2015-07-19: 1 [IU] via SUBCUTANEOUS
  Filled 2015-07-19: qty 1

## 2015-07-19 MED ORDER — ALUM & MAG HYDROXIDE-SIMETH 200-200-20 MG/5ML PO SUSP: 30.0000 mL | ORAL | Status: DC | PRN

## 2015-07-19 MED ORDER — METOCLOPRAMIDE HCL 10 MG PO TABS
10.0000 mg | ORAL_TABLET | Freq: Three times a day (TID) | ORAL | Status: DC
  Administered 2015-07-19 – 2015-07-27 (×30): 10 mg via ORAL
  Filled 2015-07-19 (×19): qty 1
  Filled 2015-07-19: qty 2
  Filled 2015-07-19 (×5): qty 1
  Filled 2015-07-19: qty 2
  Filled 2015-07-19 (×10): qty 1
  Filled 2015-07-19: qty 2

## 2015-07-19 MED ORDER — LORATADINE 10 MG PO TABS
10.0000 mg | ORAL_TABLET | Freq: Every day | ORAL | Status: DC
  Administered 2015-07-20 – 2015-07-27 (×8): 10 mg via ORAL
  Filled 2015-07-19 (×9): qty 1

## 2015-07-19 MED ORDER — QUETIAPINE FUMARATE ER 300 MG PO TB24
300.0000 mg | ORAL_TABLET | Freq: Every day | ORAL | Status: DC
  Administered 2015-07-19: 300 mg via ORAL
  Filled 2015-07-19 (×3): qty 1

## 2015-07-19 MED ORDER — NALTREXONE HCL 50 MG PO TABS
50.0000 mg | ORAL_TABLET | Freq: Every day | ORAL | Status: DC
  Administered 2015-07-20 – 2015-08-02 (×14): 50 mg via ORAL
  Filled 2015-07-19 (×7): qty 1
  Filled 2015-07-19: qty 7
  Filled 2015-07-19 (×8): qty 1

## 2015-07-19 MED ORDER — FUROSEMIDE 40 MG PO TABS
40.0000 mg | ORAL_TABLET | Freq: Every day | ORAL | Status: DC
  Administered 2015-07-20 – 2015-08-02 (×14): 40 mg via ORAL
  Filled 2015-07-19 (×13): qty 1
  Filled 2015-07-19: qty 7
  Filled 2015-07-19 (×2): qty 1

## 2015-07-19 MED ORDER — HALOPERIDOL 5 MG PO TABS
10.0000 mg | ORAL_TABLET | Freq: Every evening | ORAL | Status: DC
  Administered 2015-07-19 – 2015-07-22 (×3): 10 mg via ORAL
  Filled 2015-07-19 (×7): qty 2

## 2015-07-19 MED ORDER — AMLODIPINE BESYLATE 5 MG PO TABS
5.0000 mg | ORAL_TABLET | Freq: Every day | ORAL | Status: DC
  Administered 2015-07-20 – 2015-08-02 (×13): 5 mg via ORAL
  Filled 2015-07-19 (×10): qty 1
  Filled 2015-07-19: qty 7
  Filled 2015-07-19 (×5): qty 1

## 2015-07-19 MED ORDER — HYDROXYZINE HCL 25 MG PO TABS
25.0000 mg | ORAL_TABLET | Freq: Four times a day (QID) | ORAL | Status: DC | PRN
  Administered 2015-07-19 – 2015-07-29 (×4): 25 mg via ORAL
  Filled 2015-07-19 (×3): qty 1
  Filled 2015-07-19: qty 10
  Filled 2015-07-19: qty 1

## 2015-07-19 MED ORDER — FOLIC ACID 1 MG PO TABS
1.0000 mg | ORAL_TABLET | Freq: Every day | ORAL | Status: DC
  Administered 2015-07-20 – 2015-08-02 (×14): 1 mg via ORAL
  Filled 2015-07-19 (×16): qty 1

## 2015-07-19 MED ORDER — MAGNESIUM HYDROXIDE 400 MG/5ML PO SUSP
30.0000 mL | Freq: Every day | ORAL | Status: DC | PRN
  Administered 2015-07-30: 30 mL via ORAL
  Filled 2015-07-19: qty 30

## 2015-07-19 MED ORDER — PANTOPRAZOLE SODIUM 40 MG PO TBEC
40.0000 mg | DELAYED_RELEASE_TABLET | Freq: Every day | ORAL | Status: DC
  Administered 2015-07-20 – 2015-07-27 (×8): 40 mg via ORAL
  Filled 2015-07-19 (×9): qty 1

## 2015-07-19 MED ORDER — INSULIN GLARGINE 100 UNIT/ML ~~LOC~~ SOLN
30.0000 [IU] | Freq: Every day | SUBCUTANEOUS | Status: DC
  Administered 2015-07-19 – 2015-08-01 (×10): 30 [IU] via SUBCUTANEOUS

## 2015-07-19 MED ORDER — GABAPENTIN 400 MG PO CAPS
400.0000 mg | ORAL_CAPSULE | Freq: Two times a day (BID) | ORAL | Status: DC
  Administered 2015-07-19 – 2015-07-26 (×14): 400 mg via ORAL
  Filled 2015-07-19 (×19): qty 1

## 2015-07-19 MED ORDER — HALOPERIDOL 5 MG PO TABS
5.0000 mg | ORAL_TABLET | Freq: Every day | ORAL | Status: DC
  Administered 2015-07-20 – 2015-07-23 (×4): 5 mg via ORAL
  Filled 2015-07-19 (×6): qty 1

## 2015-07-19 MED ORDER — BENZTROPINE MESYLATE 0.5 MG PO TABS
0.5000 mg | ORAL_TABLET | Freq: Two times a day (BID) | ORAL | Status: DC
  Administered 2015-07-19 – 2015-07-24 (×10): 0.5 mg via ORAL
  Filled 2015-07-19 (×17): qty 1

## 2015-07-19 MED ORDER — CELECOXIB 100 MG PO CAPS
100.0000 mg | ORAL_CAPSULE | Freq: Two times a day (BID) | ORAL | Status: DC
  Administered 2015-07-19 – 2015-08-02 (×28): 100 mg via ORAL
  Filled 2015-07-19 (×29): qty 1
  Filled 2015-07-19: qty 14
  Filled 2015-07-19 (×2): qty 1
  Filled 2015-07-19: qty 14

## 2015-07-19 MED ORDER — ADULT MULTIVITAMIN W/MINERALS CH
1.0000 | ORAL_TABLET | Freq: Every day | ORAL | Status: DC
  Administered 2015-07-20 – 2015-08-02 (×14): 1 via ORAL
  Filled 2015-07-19 (×16): qty 1

## 2015-07-19 MED ORDER — NICOTINE 21 MG/24HR TD PT24
21.0000 mg | MEDICATED_PATCH | Freq: Every day | TRANSDERMAL | Status: DC
  Filled 2015-07-19: qty 1

## 2015-07-19 MED ORDER — INFLUENZA VAC SPLIT QUAD 0.5 ML IM SUSY
0.5000 mL | PREFILLED_SYRINGE | INTRAMUSCULAR | Status: AC
  Administered 2015-07-20: 0.5 mL via INTRAMUSCULAR
  Filled 2015-07-19: qty 0.5

## 2015-07-19 MED ORDER — ATORVASTATIN CALCIUM 40 MG PO TABS
40.0000 mg | ORAL_TABLET | Freq: Every evening | ORAL | Status: DC
  Administered 2015-07-19 – 2015-08-01 (×14): 40 mg via ORAL
  Filled 2015-07-19 (×16): qty 1
  Filled 2015-07-19: qty 7

## 2015-07-19 MED ORDER — OXCARBAZEPINE 300 MG PO TABS
600.0000 mg | ORAL_TABLET | Freq: Every day | ORAL | Status: DC
  Administered 2015-07-20: 600 mg via ORAL
  Filled 2015-07-19 (×2): qty 2

## 2015-07-19 MED ORDER — ATENOLOL 100 MG PO TABS
100.0000 mg | ORAL_TABLET | Freq: Every day | ORAL | Status: DC
  Administered 2015-07-20 – 2015-07-21 (×2): 100 mg via ORAL
  Filled 2015-07-19 (×5): qty 1

## 2015-07-19 MED ORDER — PNEUMOCOCCAL VAC POLYVALENT 25 MCG/0.5ML IJ INJ
0.5000 mL | INJECTION | INTRAMUSCULAR | Status: AC
  Administered 2015-07-20: 0.5 mL via INTRAMUSCULAR

## 2015-07-19 MED ORDER — ASPIRIN 81 MG PO CHEW
81.0000 mg | CHEWABLE_TABLET | Freq: Every day | ORAL | Status: DC
  Administered 2015-07-20 – 2015-08-02 (×14): 81 mg via ORAL
  Filled 2015-07-19 (×16): qty 1

## 2015-07-19 MED ORDER — HYDROCHLOROTHIAZIDE 25 MG PO TABS
25.0000 mg | ORAL_TABLET | Freq: Every day | ORAL | Status: DC
  Administered 2015-07-20 – 2015-08-02 (×13): 25 mg via ORAL
  Filled 2015-07-19 (×6): qty 1
  Filled 2015-07-19: qty 7
  Filled 2015-07-19 (×10): qty 1

## 2015-07-19 MED ORDER — TRAZODONE HCL 150 MG PO TABS
150.0000 mg | ORAL_TABLET | Freq: Every day | ORAL | Status: DC
  Administered 2015-07-19 – 2015-07-22 (×4): 150 mg via ORAL
  Filled 2015-07-19 (×7): qty 1

## 2015-07-19 MED ORDER — INSULIN ASPART 100 UNIT/ML ~~LOC~~ SOLN
0.0000 [IU] | Freq: Three times a day (TID) | SUBCUTANEOUS | Status: DC
  Administered 2015-07-20: 2 [IU] via SUBCUTANEOUS
  Administered 2015-07-22 – 2015-07-24 (×4): 1 [IU] via SUBCUTANEOUS
  Administered 2015-07-25: 2 [IU] via SUBCUTANEOUS
  Administered 2015-07-26: 1 [IU] via SUBCUTANEOUS
  Administered 2015-07-27 – 2015-07-28 (×2): 0 [IU] via SUBCUTANEOUS
  Administered 2015-07-28: 2 [IU] via SUBCUTANEOUS
  Administered 2015-07-29 – 2015-08-01 (×4): 1 [IU] via SUBCUTANEOUS

## 2015-07-19 MED ORDER — ACETAMINOPHEN 325 MG PO TABS
650.0000 mg | ORAL_TABLET | Freq: Four times a day (QID) | ORAL | Status: DC | PRN
  Administered 2015-07-25 – 2015-08-01 (×5): 650 mg via ORAL
  Filled 2015-07-19 (×5): qty 2

## 2015-07-19 NOTE — Plan of Care (Signed)
Problem: Ineffective individual coping Goal: STG: Patient will remain free from self harm Outcome: Progressing Pt safe on the unit at this time     

## 2015-07-19 NOTE — Progress Notes (Signed)
D: Pt passive SI/HI/AVH- contracts for safety. Pt is pleasant and cooperative. Pt stated she had a little of everything going on, but would not elaborate.   A: Pt was offered support and encouragement. Pt was given scheduled medications. Pt was encourage to attend groups. Q 15 minute checks were done for safety.   R:Pt attends groups and interacts well with peers and staff. Pt is taking medication. Pt receptive to treatment and safety maintained on unit.

## 2015-07-19 NOTE — Progress Notes (Signed)
D Pt. Has slept most of this pm.  Pt. Denies any pain or discomfort.  Pt.'s CBG was 67.  A Writer offered support and encouragement, discussed reason for admission.  R Pt. Currently denies SI, refused the lantus but accepted her HS medications.  Pt. Has sleep apnea but reports she does not always use her CPAP machine and request that she not use it this HS.  Pt. Has awakened several times throughout the night.  Her 02 stats have been around 97%.  Pt. Remains safe on the unit.

## 2015-07-19 NOTE — Tx Team (Signed)
Initial Interdisciplinary Treatment Plan   PATIENT STRESSORS: Health problems Substance abuse Financial difficulties Noncompliant with medications   PATIENT STRENGTHS: Communication skills General fund of knowledge Supportive family/friends   PROBLEM LIST: Problem List/Patient Goals Date to be addressed Date deferred Reason deferred Estimated date of resolution  "I want to get back on my meds." 07/19/15           "I want the voices to stop." 07/19/15                                          DISCHARGE CRITERIA:  Improved stabilization in mood, thinking, and/or behavior Need for constant or close observation no longer present Reduction of life-threatening or endangering symptoms to within safe limits Verbal commitment to aftercare and medication compliance  PRELIMINARY DISCHARGE PLAN: Attend aftercare/continuing care group Outpatient therapy  PATIENT/FAMIILY INVOLVEMENT: This treatment plan has been presented to and reviewed with the patient, Maureen Swanson, and/or family member.  The patient and family have been given the opportunity to ask questions and make suggestions.  Jamie Kato 07/19/2015, 7:10 PM

## 2015-07-19 NOTE — BH Assessment (Signed)
[place] Assessment Progress Note  Per Corena Pilgrim, MD, this pt requires psychiatric hospitalization at this time.  Rory Percy, RN, [facility] has assigned pt to [facility] Rm 501-1.  Pt is under IVC initiated by [facility] and upheld by Dr Darleene Cleaver; IVC documents have been faxed to [facility].  Pt's nurse, Gerrit Friends, has been notified, and agrees to call report to [phone].  Pt is to be transported via [employer].  Jalene Mullet, [place] Triage Specialist [phone]

## 2015-07-19 NOTE — Progress Notes (Signed)
Patient invol admitted after receiving med clearance from [facility]. Patient presents with command AH to stab self or OD. Also reports HI toward ex-bf. UDS +cocaine. Patient reports non compliance with medications. Patient has extensive PMH - see H&P. Affect flat, mood depressed. Patient oriented to unit, search complete and belongings secured. Meal tray provided upon arrival to unit. Patient verbally contracts for safety while on unit. Denies VH. Patient remains safe on level III obs. Jamie Kato

## 2015-07-19 NOTE — BHH Group Notes (Signed)
Pt attended [place] group.  Pt sat by herself, did not socialize with peers.  Victorino Sparrow, MHT

## 2015-07-19 NOTE — Consult Note (Signed)
[place] Psychiatry Consult   Reason for Consult:  Depression, suicidal homicidal ideation Referring Physician:  EDP Patient Identification: Maureen Swanson MRN:  [id number] Principal Diagnosis: Schizoaffective disorder, bipolar type [facility]) Diagnosis:   Patient Active Problem List   Diagnosis Date Noted  . Schizoaffective disorder, bipolar type ([place]) [F25.0] 04/03/2015    Priority: High  . Cocaine use disorder, moderate, in early remission [F14.90] 04/03/2015  . Diabetes ([place]) [E11.9] 04/03/2015  . Alcohol use disorder, moderate, dependence ([place]) [F10.20] 04/03/2015  . Conversion disorder [F44.9] 12/09/2014  . Acute ischemic stroke ([place]) [I63.9] 12/08/2014  . Left-sided weakness [M62.89] 12/08/2014  . 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 Knee pain [M25.569] 10/17/2010  . HYPERGLYCEMIA [R73.09] 08/16/2010  . MAMMOGRAM, ABNORMAL, RIGHT [R92.8] 08/05/2010  . HYPERLIPIDEMIA [E78.5] 06/21/2010  . AMENORRHEA, SECONDARY [N91.2] 10/29/2009  . 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 HELICOBACTER PYLORI INFECTION [A04.8] 04/02/2009  . GERD [K21.9] 10/11/2007  . Essential hypertension [I10] 02/26/2007    Total Time spent with patient: 45 minutes  Subjective:   Maureen Swanson is a 50 y.o. female patient admitted with Depression, suicidal homicidal ideation  HPI:  AA female, 50 years old was evaluated for suicidal ideation, homicidal ideation and auditory hallucination.  Patient has a hx of depression and reports that she receives [place] care at [facility].  Patient states she became homeless and suicidal after her boyfriend ended their relationship.  Patient reports that her plan is to cut herself.  Patient admits to several suicide attempts in the past by OD and cutting.  Patient reports that he is depressed due to multiple medical problems.  Patient reports poor sleep and appetite.  Patient states that she need counseling therapy.  Patient is not able to  contract for safety and has been accepted for admission with a bed assigned.  Past Psychiatric History:  Depression, Alcohol use disorder, Cocaine use disorder  Risk to Self: Suicidal Ideation: Yes-Currently Present Suicidal Intent: Yes-Currently Present Is patient at risk for suicide?: Yes Suicidal Plan?: Yes-Currently Present Specify Current Suicidal Plan: Stasb herself in the stomach or overdose on medication  Access to Means: Yes Specify Access to Suicidal Means: Large knife and psychiatric medication  What has been your use of drugs/alcohol within the last 12 months?: Cocaine  How many times?: 3 Other Self Harm Risks: None Reported Triggers for Past Attempts: Hallucinations Intentional Self Injurious Behavior: None Risk to Others: Homicidal Ideation: No Thoughts of Harm to Others: No Current Homicidal Intent: No Current Homicidal Plan: No Access to Homicidal Means: No Identified Victim: None Reported History of harm to others?: No Assessment of Violence: None Noted Violent Behavior Description: None Reported Does patient have access to weapons?: No Criminal Charges Pending?: No Does patient have a court date: No Prior Inpatient Therapy: Prior Inpatient Therapy: Yes Prior Therapy Dates: 2016 Prior Therapy Facilty/Provider(s): [facility] Reason for Treatment: SI and Psychosis Prior Outpatient Therapy: Prior Outpatient Therapy: Yes Prior Therapy Dates: Onging  Prior Therapy Facilty/Provider(s): [facility]  Reason for Treatment: Medication Management Does patient have an ACCT team?: No Does patient have Intensive In-House Services?  : No Does patient have [facility] services? : No Does patient have P4CC services?: No  Past Medical History:  Past Medical History  Diagnosis Date  . High cholesterol   . Depression   . Gout   . Anxiety   . Bipolar 1 disorder ([place])   . GERD (gastroesophageal reflux disease)   . Substance abuse  crack cocaine  . Headache(784.0)     migraines  .  TMJ (temporomandibular joint disorder)   . Asthma     daily and prn inhalers  . Arthritis     back and knees  . Gastric ulcer   . 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 Hypertension     under control, has been on med. x 12 yrs.  . Diabetes mellitus     diet-controlled  . CHF (congestive heart failure) [facility])     Past Surgical History  Procedure Laterality Date  . Knee arthroscopy w/ partial medial meniscectomy  05/01/2010    right  . Breast lumpectomy with needle localization  04/19/2012    Procedure: BREAST LUMPECTOMY WITH NEEDLE LOCALIZATION;  Surgeon: [name], MD;  Location: [place];  Service: General;  Laterality: Right;   Family History:  Family History  Problem Relation Age of Onset  . Diabetes Mother   . Breast cancer Mother   . Cancer Father   . Bipolar disorder Maternal Aunt    Family Psychiatric  History: Unknown Social History:  History  Alcohol Use  . 0.0 oz/week    Comment: reports she drinks 3 40 ounce beers daily     History  Drug Use No    Comment: last use 1/30 (07/18/15)    Social History   Social History  . Marital Status: Single    Spouse Name: N/A  . Number of Children: N/A  . Years of Education: N/A   Social History Main Topics  . Smoking status: Current Every Day Smoker -- 0.50 packs/day for 15 years    Types: Cigarettes    Last Attempt to Quit: 08/02/2012  . Smokeless tobacco: Never Used  . Alcohol Use: 0.0 oz/week     Comment: reports she drinks 3 40 ounce beers daily  . Drug Use: No     Comment: last use 1/30 (07/18/15)  . Sexual Activity: Not Asked   Other Topics Concern  . None   Social History Narrative   Additional Social History:    Allergies:   Allergies  Allergen Reactions  . Chocolate Hives  . Orange Hives    "Acid foods"  . Penicillins Hives    Has patient had a PCN reaction causing immediate rash, facial/tongue/throat swelling, SOB or lightheadedness with hypotension:  Yes Has patient had a PCN reaction causing severe rash involving mucus membranes or skin necrosis: Yes Has patient had a PCN reaction that required hospitalization No Has patient had a PCN reaction occurring within the last 10 years: No If all of the above answers are "NO", then may proceed with Cephalosporin use.   . Tomato Hives    "acid foods"    Labs:  Results for orders placed or performed during the hospital encounter of 07/18/15 (from the past 48 hour(s))  Comprehensive metabolic panel     Status: Abnormal   Collection Time: 07/18/15  3:08 PM  Result Value Ref Range   Sodium 140 135 - 145 mmol/L   Potassium 3.5 3.5 - 5.1 mmol/L   Chloride 104 101 - 111 mmol/L   CO2 24 22 - 32 mmol/L   Glucose, Bld 148 (H) 65 - 99 mg/dL   BUN 19 6 - 20 mg/dL   Creatinine, Ser 1.24 (H) 0.44 - 1.00 mg/dL   Calcium 8.9 8.9 - 10.3 mg/dL   Total Protein 7.5 6.5 - 8.1 g/dL   Albumin 4.0 3.5 -  5.0 g/dL   AST 25 15 - 41 U/L   ALT 12 (L) 14 - 54 U/L   Alkaline Phosphatase 79 38 - 126 U/L   Total Bilirubin 0.5 0.3 - 1.2 mg/dL   GFR calc non Af Amer 50 (L) >60 mL/min   GFR calc Af Amer 58 (L) >60 mL/min    Comment: (NOTE) The eGFR has been calculated using the CKD EPI equation. This calculation has not been validated in all clinical situations. eGFR's persistently <60 mL/min signify possible Chronic Kidney Disease.    Anion gap 12 5 - 15  Ethanol (ETOH)     Status: None   Collection Time: 07/18/15  3:08 PM  Result Value Ref Range   Alcohol, Ethyl (B) <5 <5 mg/dL    Comment:        LOWEST DETECTABLE LIMIT FOR SERUM ALCOHOL IS 5 mg/dL FOR MEDICAL PURPOSES ONLY   Salicylate level     Status: None   Collection Time: 07/18/15  3:08 PM  Result Value Ref Range   Salicylate Lvl <1.6 2.8 - 30.0 mg/dL  Acetaminophen level     Status: Abnormal   Collection Time: 07/18/15  3:08 PM  Result Value Ref Range   Acetaminophen (Tylenol), Serum <10 (L) 10 - 30 ug/mL    Comment:        THERAPEUTIC  CONCENTRATIONS VARY SIGNIFICANTLY. A RANGE OF 10-30 ug/mL MAY BE AN EFFECTIVE CONCENTRATION FOR MANY PATIENTS. HOWEVER, SOME ARE BEST TREATED AT CONCENTRATIONS OUTSIDE THIS RANGE. ACETAMINOPHEN CONCENTRATIONS >150 ug/mL AT 4 HOURS AFTER INGESTION AND >50 ug/mL AT 12 HOURS AFTER INGESTION ARE OFTEN ASSOCIATED WITH TOXIC REACTIONS.   CBC     Status: Abnormal   Collection Time: 07/18/15  3:08 PM  Result Value Ref Range   WBC 10.0 4.0 - 10.5 K/uL   RBC 4.09 3.87 - 5.11 MIL/uL   Hemoglobin 11.4 (L) 12.0 - 15.0 g/dL   HCT 37.0 36.0 - 46.0 %   MCV 90.5 78.0 - 100.0 fL   MCH 27.9 26.0 - 34.0 pg   MCHC 30.8 30.0 - 36.0 g/dL   RDW 13.8 11.5 - 15.5 %   Platelets 234 150 - 400 K/uL  I-Stat beta hCG blood, ED ([facility], [facility], [facility] only)     Status: None   Collection Time: 07/18/15  3:13 PM  Result Value Ref Range   I-stat hCG, quantitative <5.0 <5 mIU/mL   Comment 3            Comment:   GEST. AGE      CONC.  (mIU/mL)   <=1 WEEK        5 - 50     2 WEEKS       50 - 500     3 WEEKS       100 - 10,000     4 WEEKS     1,000 - 30,000        FEMALE AND NON-PREGNANT FEMALE:     LESS THAN 5 mIU/mL   CBG monitoring, ED     Status: Abnormal   Collection Time: 07/18/15  3:14 PM  Result Value Ref Range   Glucose-Capillary 137 (H) 65 - 99 mg/dL  Urine rapid drug screen (hosp performed) (Not at [facility])     Status: Abnormal   Collection Time: 07/18/15  3:21 PM  Result Value Ref Range   Opiates NONE DETECTED NONE DETECTED   Cocaine POSITIVE (A) NONE DETECTED   Benzodiazepines NONE DETECTED NONE  DETECTED   Amphetamines NONE DETECTED NONE DETECTED   Tetrahydrocannabinol NONE DETECTED NONE DETECTED   Barbiturates NONE DETECTED NONE DETECTED    Comment:        DRUG SCREEN FOR MEDICAL PURPOSES ONLY.  IF CONFIRMATION IS NEEDED FOR ANY PURPOSE, NOTIFY LAB WITHIN 5 DAYS.        LOWEST DETECTABLE LIMITS FOR URINE DRUG SCREEN Drug Class       Cutoff (ng/mL) Amphetamine      1000 Barbiturate       200 Benzodiazepine   154 Tricyclics       008 Opiates          300 Cocaine          300 THC              50   CBG monitoring, ED     Status: Abnormal   Collection Time: 07/18/15  5:59 PM  Result Value Ref Range   Glucose-Capillary 139 (H) 65 - 99 mg/dL  CBG monitoring, ED     Status: None   Collection Time: 07/18/15  8:32 PM  Result Value Ref Range   Glucose-Capillary 67 65 - 99 mg/dL  CBG monitoring, ED     Status: Abnormal   Collection Time: 07/19/15  8:08 AM  Result Value Ref Range   Glucose-Capillary 100 (H) 65 - 99 mg/dL  CBG monitoring, ED     Status: Abnormal   Collection Time: 07/19/15  1:34 PM  Result Value Ref Range   Glucose-Capillary 118 (H) 65 - 99 mg/dL    Current Facility-Administered Medications  Medication Dose Route Frequency Provider Last Rate Last Dose  . amLODipine (NORVASC) tablet 5 mg  5 mg Oral Daily Carmin Muskrat, MD   5 mg at 07/19/15 1012  . aspirin chewable tablet 81 mg  81 mg Oral Daily Carmin Muskrat, MD   81 mg at 07/19/15 1012  . atenolol (TENORMIN) tablet 100 mg  100 mg Oral Daily Carmin Muskrat, MD   100 mg at 07/19/15 1011  . atorvastatin (LIPITOR) tablet 40 mg  40 mg Oral QPM Carmin Muskrat, MD   40 mg at 07/18/15 1849  . baclofen (LIORESAL) tablet 10 mg  10 mg Oral TID Carmin Muskrat, MD   10 mg at 07/19/15 1012  . benztropine (COGENTIN) tablet 0.5 mg  0.5 mg Oral BID Carmin Muskrat, MD   0.5 mg at 07/19/15 1011  . calcium carbonate (OS-CAL - dosed in mg of elemental calcium) tablet 500 mg of elemental calcium  1 tablet Oral Q breakfast Carmin Muskrat, MD   500 mg of elemental calcium at 07/19/15 0906  . celecoxib (CELEBREX) capsule 100 mg  100 mg Oral BID Carmin Muskrat, MD   100 mg at 67/61/95 0932  . folic acid (FOLVITE) tablet 1 mg  1 mg Oral Daily Carmin Muskrat, MD   1 mg at 07/19/15 1012  . furosemide (LASIX) tablet 40 mg  40 mg Oral Daily Carmin Muskrat, MD   40 mg at 07/19/15 1012  . gabapentin (NEURONTIN) capsule 400 mg   400 mg Oral BID Carmin Muskrat, MD   400 mg at 07/19/15 1011  . haloperidol (HALDOL) tablet 10 mg  10 mg Oral QPM Carmin Muskrat, MD   10 mg at 07/18/15 1722  . haloperidol (HALDOL) tablet 5 mg  5 mg Oral Q breakfast Carmin Muskrat, MD   5 mg at 07/19/15 0907  . hydrochlorothiazide (HYDRODIURIL) tablet 25 mg  25 mg Oral Daily  Carmin Muskrat, MD   25 mg at 07/19/15 1011  . hydrOXYzine (ATARAX/VISTARIL) tablet 25 mg  25 mg Oral Q6H PRN Carmin Muskrat, MD      . insulin aspart (novoLOG) injection 0-9 Units  0-9 Units Subcutaneous TID WC Sharlett Iles, MD   0 Units at 07/19/15 0909  . insulin glargine (LANTUS) injection 30 Units  30 Units Subcutaneous QHS Carmin Muskrat, MD   30 Units at 07/18/15 2124  . lamoTRIgine (LAMICTAL) tablet 25 mg  25 mg Oral BID Carmin Muskrat, MD   25 mg at 07/19/15 1012  . linagliptin (TRADJENTA) tablet 5 mg  5 mg Oral Daily Carmin Muskrat, MD   5 mg at 07/19/15 1012  . loratadine (CLARITIN) tablet 10 mg  10 mg Oral Daily Carmin Muskrat, MD   10 mg at 07/19/15 1011  . metFORMIN (GLUCOPHAGE) tablet 1,000 mg  1,000 mg Oral BID WC Carmin Muskrat, MD   1,000 mg at 07/19/15 0907  . metoCLOPramide (REGLAN) tablet 10 mg  10 mg Oral TID AC & HS Carmin Muskrat, MD   10 mg at 07/19/15 0907  . multivitamin with minerals tablet 1 tablet  1 tablet Oral Daily Carmin Muskrat, MD   1 tablet at 07/19/15 1015  . naltrexone (DEPADE) tablet 50 mg  50 mg Oral Daily Carmin Muskrat, MD   50 mg at 07/19/15 1011  . nicotine (NICODERM CQ - dosed in mg/24 hours) patch 21 mg  21 mg Transdermal Daily Carmin Muskrat, MD   21 mg at 07/19/15 1012  . Oxcarbazepine (TRILEPTAL) tablet 600 mg  600 mg Oral Daily Carmin Muskrat, MD   600 mg at 07/19/15 1010  . pantoprazole (PROTONIX) EC tablet 40 mg  40 mg Oral Daily Carmin Muskrat, MD   40 mg at 07/19/15 1011  . potassium chloride SA (K-DUR,KLOR-CON) CR tablet 20 mEq  20 mEq Oral Daily Carmin Muskrat, MD   20 mEq at 07/19/15 1011  .  QUEtiapine (SEROQUEL XR) 24 hr tablet 300 mg  300 mg Oral Q2000 Carmin Muskrat, MD   300 mg at 07/18/15 2024  . traZODone (DESYREL) tablet 150 mg  150 mg Oral QHS Carmin Muskrat, MD   150 mg at 07/18/15 2122   Current Outpatient Prescriptions  Medication Sig Dispense Refill  . albuterol (PROVENTIL HFA;VENTOLIN HFA) 108 (90 BASE) MCG/ACT inhaler Inhale 2 puffs into the lungs every 6 (six) hours as needed for wheezing or shortness of breath.    Marland Kitchen amLODipine (NORVASC) 5 MG tablet Take 1 tablet (5 mg total) by mouth daily.    Marland Kitchen aspirin 81 MG tablet Take 1 tablet (81 mg total) by mouth daily. 30 tablet   . atenolol (TENORMIN) 100 MG tablet Take 1 tablet (100 mg total) by mouth daily.    Marland Kitchen atorvastatin (LIPITOR) 40 MG tablet Take 1 tablet (40 mg total) by mouth every evening.    . baclofen (LIORESAL) 10 MG tablet Take 1 tablet (10 mg total) by mouth 3 (three) times daily. 30 each 0  . benztropine (COGENTIN) 0.5 MG tablet Take 1 tablet (0.5 mg total) by mouth 2 (two) times daily. 60 tablet 0  . calcium carbonate (OS-CAL - DOSED IN MG OF ELEMENTAL CALCIUM) 1250 (500 CA) MG tablet Take 1 tablet (500 mg of elemental calcium total) by mouth daily with breakfast. 30 tablet 0  . celecoxib (CELEBREX) 100 MG capsule Take 1 capsule (100 mg total) by mouth 2 (two) times daily.    . folic acid (  FOLVITE) 1 MG tablet Take 1 tablet (1 mg total) by mouth daily. 30 tablet 3  . furosemide (LASIX) 40 MG tablet Take 1 tablet (40 mg total) by mouth daily. 30 tablet   . gabapentin (NEURONTIN) 400 MG capsule Take 1 capsule (400 mg total) by mouth 2 (two) times daily. 60 capsule 0  . haloperidol (HALDOL) 10 MG tablet Take 1 tablet (10 mg total) by mouth every evening. 30 tablet 0  . haloperidol (HALDOL) 5 MG tablet Take 1 tablet (5 mg total) by mouth daily with breakfast. 30 tablet 0  . hydrochlorothiazide (HYDRODIURIL) 25 MG tablet Take 1 tablet (25 mg total) by mouth daily.    . hydrOXYzine (ATARAX/VISTARIL) 25 MG tablet  Take 1 tablet (25 mg total) by mouth every 6 (six) hours as needed for anxiety. 30 tablet 0  . insulin aspart (NOVOLOG) 100 UNIT/ML injection Inject 0-15 Units into the skin 3 (three) times daily with meals. Use Sliding scale given to you by [facility] Nurse 10 mL 0  . insulin glargine (LANTUS) 100 UNIT/ML injection Inject 0.3 mLs (30 Units total) into the skin at bedtime. To be managed by your outpatient provider 10 mL 0  . lamoTRIgine (LAMICTAL) 25 MG tablet Take 1 tablet (25 mg total) by mouth 2 (two) times daily. 60 tablet 0  . metFORMIN (GLUCOPHAGE) 1000 MG tablet Take 1 tablet (1,000 mg total) by mouth 2 (two) times daily with a meal.    . metoCLOPramide (REGLAN) 10 MG tablet Take 1 tablet (10 mg total) by mouth 4 (four) times daily -  before meals and at bedtime.    . Multiple Vitamin (MULTIVITAMIN WITH MINERALS) TABS tablet Take 1 tablet by mouth daily. 30 tablet 3  . naltrexone (DEPADE) 50 MG tablet Take 1 tablet (50 mg total) by mouth daily. 30 tablet 0  . nicotine (NICODERM CQ - DOSED IN MG/24 HOURS) 21 mg/24hr patch Place 1 patch (21 mg total) onto the skin daily. 28 patch 0  . omeprazole (PRILOSEC) 20 MG capsule Take 1 capsule (20 mg total) by mouth daily. For GERD    . Oxcarbazepine (TRILEPTAL) 600 MG tablet Take 1 tablet (600 mg total) by mouth daily. 30 tablet 0  . potassium chloride SA (K-DUR,KLOR-CON) 20 MEQ tablet Take 1 tablet (20 mEq total) by mouth daily. 3 tablet 0  . QUEtiapine (SEROQUEL XR) 300 MG 24 hr tablet Take 1 tablet (300 mg total) by mouth daily at 8 pm. 30 tablet 0  . sitaGLIPtin (JANUVIA) 50 MG tablet Take 1 tablet (50 mg total) by mouth daily. 30 tablet 0  . traZODone (DESYREL) 150 MG tablet Take 1 tablet (150 mg total) by mouth at bedtime. 30 tablet 0  . cetirizine (ZYRTEC) 10 MG tablet Take 10 mg by mouth daily as needed for allergies.       Musculoskeletal: Strength & Muscle Tone: within normal limits Gait & Station: normal Patient leans: N/A  Psychiatric  Specialty Exam: Review of Systems  Constitutional: Negative.   HENT: Negative.   Eyes: Negative.   Respiratory: Negative.   Cardiovascular: Negative.   Gastrointestinal: Negative.   Genitourinary: Negative.   Musculoskeletal: Negative.   Skin: Negative.   Neurological: Negative.   Endo/Heme/Allergies: Negative.   See PMH as documented, stable at this time  Blood pressure 110/64, pulse 72, temperature 98.1 F (36.7 C), temperature source Oral, resp. rate 17, SpO2 98 %.There is no weight on file to calculate BMI.  General Appearance: Casual  Eye Contact::  Good  Speech:  Clear and Coherent and Normal Rate  Volume:  Normal  Mood:  Angry, Anxious and Depressed  Affect:  Congruent, Depressed and Flat  Thought Process:  Coherent, Goal Directed and Intact  Orientation:  Full (Time, Place, and Person)  Thought Content:  Hallucinations: Auditory Visual  Suicidal Thoughts:  Yes.  with intent/plan  Homicidal Thoughts:  Yes.  without intent/plan  Memory:  Immediate;   Good Recent;   Good Remote;   Good  Judgement:  Poor  Insight:  Good  Psychomotor Activity:  Psychomotor Retardation  Concentration:  Good  Recall:  NA  Fund of Knowledge:Fair  Language: Good  Akathisia:  No  Handed:  Right  AIMS (if indicated):     Assets:  Desire for Improvement Housing Intimacy Transportation  ADL's:  Intact  Cognition: WNL  Sleep:       Disposition:  Accepted for admission and have a bed assigned to her.  Delfin Gant, NP   PMHNP-BC 07/19/2015 2:07 PM Patient seen face-to-face for psychiatric evaluation, chart reviewed and case discussed with the physician extender and developed treatment plan. Reviewed the information documented and agree with the treatment plan. Corena Pilgrim, MD

## 2015-07-19 NOTE — ED Notes (Signed)
Patient has been pleasant and cooperative today.  She has stayed in her room watching television.  She has attended to hygiene.  Appetite is good.  She has been transferred to [facility] and left the unit ambulatory with [employer].

## 2015-07-20 ENCOUNTER — Encounter (HOSPITAL_COMMUNITY): Payer: Self-pay | Admitting: Psychiatry

## 2015-07-20 DIAGNOSIS — F1020 Alcohol dependence, uncomplicated: Secondary | ICD-10-CM

## 2015-07-20 DIAGNOSIS — F1420 Cocaine dependence, uncomplicated: Secondary | ICD-10-CM

## 2015-07-20 DIAGNOSIS — E119 Type 2 diabetes mellitus without complications: Secondary | ICD-10-CM

## 2015-07-20 DIAGNOSIS — F17200 Nicotine dependence, unspecified, uncomplicated: Secondary | ICD-10-CM | POA: Clinically undetermined

## 2015-07-20 LAB — GLUCOSE, CAPILLARY
GLUCOSE-CAPILLARY: 115 mg/dL — AB (ref 65–99)
GLUCOSE-CAPILLARY: 100 mg/dL — AB (ref 65–99)
GLUCOSE-CAPILLARY: 169 mg/dL — AB (ref 65–99)
Glucose-Capillary: 132 mg/dL — ABNORMAL HIGH (ref 65–99)

## 2015-07-20 MED ORDER — QUETIAPINE FUMARATE 50 MG PO TABS
ORAL_TABLET | ORAL | Status: AC
  Administered 2015-07-20: 15:00:00
  Filled 2015-07-20: qty 1

## 2015-07-20 MED ORDER — QUETIAPINE FUMARATE ER 200 MG PO TB24
400.0000 mg | ORAL_TABLET | Freq: Every day | ORAL | Status: DC
  Administered 2015-07-20 – 2015-07-24 (×5): 400 mg via ORAL
  Filled 2015-07-20: qty 1
  Filled 2015-07-20: qty 2
  Filled 2015-07-20: qty 1
  Filled 2015-07-20 (×2): qty 2
  Filled 2015-07-20 (×2): qty 1
  Filled 2015-07-20 (×2): qty 2

## 2015-07-20 MED ORDER — OXCARBAZEPINE 300 MG PO TABS
300.0000 mg | ORAL_TABLET | Freq: Two times a day (BID) | ORAL | Status: DC
  Administered 2015-07-20 – 2015-08-02 (×26): 300 mg via ORAL
  Filled 2015-07-20 (×11): qty 1
  Filled 2015-07-20: qty 14
  Filled 2015-07-20 (×3): qty 1
  Filled 2015-07-20: qty 14
  Filled 2015-07-20 (×14): qty 1

## 2015-07-20 MED ORDER — QUETIAPINE FUMARATE 25 MG PO TABS
25.0000 mg | ORAL_TABLET | Freq: Three times a day (TID) | ORAL | Status: DC | PRN
  Filled 2015-07-20: qty 1

## 2015-07-20 NOTE — Progress Notes (Signed)
D: Pt was mainly in bed with eye closed for most of the evening. Pt at the time of assessment endorses severe anxiety and depression; states both my depression and anxiety at an 8 at the moment. Pt is very flat. Pt however, denies pain, SI, HI and AVH. Pt remained calm and cooperative through the shift assessment. A: Medications offered as prescribed.  Support, encouragement, and safe environment provided.  15-minute safety checks continue. R: Pt was med compliant.  Pt did not attend group. Safety checks continue.

## 2015-07-20 NOTE — BHH Group Notes (Signed)
[facility] LCSW Aftercare Discharge Planning Group Note   07/20/2015 11:00 AM  Participation Quality:  Engaged  Mood/Affect:  Depressed  Depression Rating:  10  Anxiety Rating:  10  Thoughts of Suicide:  Yes Will you contract for safety?   Yes  Current AVH:  Yes  Plan for Discharge/Comments:  "I went to stay with my sister when I left here.  That did not work out.  So I went back with my ex.  That was a bad decision.  I stopped taking my meds because they were not working.  I have signed up for shelter through the [facility].  That was about a month ago.  I have been calling [facility] in [place] to see if I can get in there.  I am open to going to [place] as well.  Transportation Means:   Supports:  [name]

## 2015-07-20 NOTE — Progress Notes (Signed)
Maureen Swanson is seen OOB UAL on the 500 hall this morning...she tolerates this well. She is flat,depressed. She makes good eye contact. She completed her daily assessment and on it she wrote she stated she has had SI today, but she quickly contracted with this nurse,  to stay safe . She rated her derpession, hopelessness and anxiety  " 9/9/9", respectively.   A SHe has attended her morning group and is seen in the dayroom talking on the phone.   R MD has adjusted her seroquel and trileptal dosages. Safety in place.

## 2015-07-20 NOTE — [facility] Suicide Risk Assessment (Signed)
[facility] INPATIENT:  Family/Significant Other Suicide Prevention Education  Suicide Prevention Education:  Patient Refusal for Family/Significant Other Suicide Prevention Education: The patient Maureen Swanson has refused to provide written consent for family/significant other to be provided Family/Significant Other Suicide Prevention Education during admission and/or prior to discharge.  Physician notified.  SPE completed with pt, as pt refused to consent to family contact. SPI pamphlet provided to pt and pt was encouraged to share information with support network, ask questions, and talk about any concerns relating to SPE. Pt denies access to guns/firearms and verbalized understanding of information provided. Mobile Crisis information also provided to pt.   Smart, Fraida Veldman LCSW 07/20/2015, 3:55 PM
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
## 2015-07-20 NOTE — Progress Notes (Addendum)
[facility] MD Progress Note  07/20/2015 5:21 PM Maureen Swanson  MRN:  [id number] Subjective:  "I'm ok.  Still sleepy.  I did not go to groups today.  Ill keep trying.    Ojective:  Patient came in with nervous breakdown.  Diagnosed with Schizoaffective disorder, bipolar type. Patient seen today.  Chart and medications reviewed. Patient remains depressed with flat affect. She is staying in her room majority of day.  She sleeps. There are no c/o ADR's.  She is tolerating the meds,.  She is compliant.   There are no disruptive behaviors noted on the unit.     Principal Problem: Schizoaffective disorder, bipolar type ([place]) Diagnosis:   Patient Active Problem List   Diagnosis Date Noted  . Schizoaffective disorder, bipolar type ([place]) [F25.0] 04/03/2015    Priority: High  . Tobacco use disorder [F17.200] 07/20/2015  . Diabetes mellitus ([place]) [E11.9] 07/20/2015  . Suicidal ideation [R45.851]   . Cocaine use disorder, moderate, dependence ([place]) [F14.20] 04/03/2015  . Diabetes ([place]) [E11.9] 04/03/2015  . Alcohol use disorder, moderate, dependence ([place]) [F10.20] 04/03/2015  . Conversion disorder [F44.9] 12/09/2014  . Acute ischemic stroke ([place]) [I63.9] 12/08/2014  . Left-sided weakness [M62.89] 12/08/2014  . 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 Knee pain [M25.569] 10/17/2010  . HYPERGLYCEMIA [R73.09] 08/16/2010  . MAMMOGRAM, ABNORMAL, RIGHT [R92.8] 08/05/2010  . HYPERLIPIDEMIA [E78.5] 06/21/2010  . AMENORRHEA, SECONDARY [N91.2] 10/29/2009  . 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 HELICOBACTER PYLORI INFECTION [A04.8] 04/02/2009  . GERD [K21.9] 10/11/2007  . Essential hypertension [I10] 02/26/2007   Total Time spent with patient: 30 minutes  Past Psychiatric History: see above noted  Past Medical History:  Past Medical History  Diagnosis Date  . High cholesterol   . Depression   . Gout   . Anxiety   . Bipolar 1 disorder ([place])   . GERD (gastroesophageal reflux disease)    . Substance abuse     crack cocaine  . Headache(784.0)     migraines  . TMJ (temporomandibular joint disorder)   . Asthma     daily and prn inhalers  . Arthritis     back and knees  . Gastric ulcer   . 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 Hypertension     under control, has been on med. x 12 yrs.  . Diabetes mellitus     diet-controlled  . CHF (congestive heart failure) [facility])     Past Surgical History  Procedure Laterality Date  . Knee arthroscopy w/ partial medial meniscectomy  05/01/2010    right  . Breast lumpectomy with needle localization  04/19/2012    Procedure: BREAST LUMPECTOMY WITH NEEDLE LOCALIZATION;  Surgeon: [name], MD;  Location: [facility];  Service: General;  Laterality: Right;   Family History:  Family History  Problem Relation Age of Onset  . Diabetes Mother   . Breast cancer Mother   . Cancer Father   . Bipolar disorder Maternal Aunt   . Schizophrenia Maternal Grandfather   . Alcoholism Maternal Uncle    Family Psychiatric  History:  Schizophrenia, grandfather Social History:  History  Alcohol Use  . 0.0 oz/week    Comment: reports she drinks 3 40 ounce beers daily     History  Drug Use No    Comment: last use 1/30 (07/18/15)    Social History   Social History  . Marital Status: Single    Spouse  Name: N/A  . Number of Children: N/A  . Years of Education: N/A   Social History Main Topics  . Smoking status: Current Every Day Smoker -- 0.50 packs/day for 15 years    Types: Cigarettes    Last Attempt to Quit: 08/02/2012  . Smokeless tobacco: Never Used  . Alcohol Use: 0.0 oz/week     Comment: reports she drinks 3 40 ounce beers daily  . Drug Use: No     Comment: last use 1/30 (07/18/15)  . Sexual Activity: Not Asked   Other Topics Concern  . None   Social History Narrative   Additional Social History:     Sleep: Fair  Appetite:  Fair  Current Medications: Current  Facility-Administered Medications  Medication Dose Route Frequency Provider Last Rate Last Dose  . acetaminophen (TYLENOL) tablet 650 mg  650 mg Oral Q6H PRN Delfin Gant, NP      . alum & mag hydroxide-simeth (MAALOX/MYLANTA) 200-200-20 MG/5ML suspension 30 mL  30 mL Oral Q4H PRN Delfin Gant, NP      . amLODipine (NORVASC) tablet 5 mg  5 mg Oral Daily Delfin Gant, NP   5 mg at 07/20/15 1001  . aspirin chewable tablet 81 mg  81 mg Oral Daily Delfin Gant, NP   81 mg at 07/20/15 0957  . atenolol (TENORMIN) tablet 100 mg  100 mg Oral Daily Delfin Gant, NP   100 mg at 07/20/15 0957  . atorvastatin (LIPITOR) tablet 40 mg  40 mg Oral QPM Delfin Gant, NP   40 mg at 07/19/15 2027  . baclofen (LIORESAL) tablet 10 mg  10 mg Oral TID Delfin Gant, NP   10 mg at 07/20/15 1439  . benztropine (COGENTIN) tablet 0.5 mg  0.5 mg Oral BID Delfin Gant, NP   0.5 mg at 07/20/15 1008  . calcium carbonate (OS-CAL - dosed in mg of elemental calcium) tablet 500 mg of elemental calcium  1 tablet Oral Q breakfast Delfin Gant, NP   500 mg of elemental calcium at 07/20/15 0959  . celecoxib (CELEBREX) capsule 100 mg  100 mg Oral BID Delfin Gant, NP   100 mg at 07/20/15 0959  . folic acid (FOLVITE) tablet 1 mg  1 mg Oral Daily Delfin Gant, NP   1 mg at 07/20/15 0958  . furosemide (LASIX) tablet 40 mg  40 mg Oral Daily Delfin Gant, NP   40 mg at 07/20/15 0956  . gabapentin (NEURONTIN) capsule 400 mg  400 mg Oral BID Delfin Gant, NP   400 mg at 07/20/15 0956  . haloperidol (HALDOL) tablet 10 mg  10 mg Oral QPM Delfin Gant, NP   10 mg at 07/19/15 2028  . haloperidol (HALDOL) tablet 5 mg  5 mg Oral Q breakfast Delfin Gant, NP   5 mg at 07/20/15 1000  . hydrochlorothiazide (HYDRODIURIL) tablet 25 mg  25 mg Oral Daily Delfin Gant, NP   25 mg at 07/20/15 1000  . hydrOXYzine (ATARAX/VISTARIL) tablet 25 mg  25 mg Oral Q6H PRN  Delfin Gant, NP   25 mg at 07/19/15 2158  . Influenza vac split quadrivalent PF (FLUARIX) injection 0.5 mL  0.5 mL Intramuscular Tomorrow-1000 Nicholaus Bloom, MD      . insulin aspart (novoLOG) injection 0-9 Units  0-9 Units Subcutaneous TID WC Delfin Gant, NP   0 Units at 07/20/15 [phone]  . insulin  glargine (LANTUS) injection 30 Units  30 Units Subcutaneous QHS Delfin Gant, NP   30 Units at 07/19/15 2156  . lamoTRIgine (LAMICTAL) tablet 25 mg  25 mg Oral BID Delfin Gant, NP   25 mg at 07/20/15 1000  . linagliptin (TRADJENTA) tablet 5 mg  5 mg Oral Daily Delfin Gant, NP   5 mg at 07/20/15 1000  . loratadine (CLARITIN) tablet 10 mg  10 mg Oral Daily Delfin Gant, NP   10 mg at 07/20/15 0957  . magnesium hydroxide (MILK OF MAGNESIA) suspension 30 mL  30 mL Oral Daily PRN Delfin Gant, NP      . metFORMIN (GLUCOPHAGE) tablet 1,000 mg  1,000 mg Oral BID WC Delfin Gant, NP   1,000 mg at 07/20/15 0958  . metoCLOPramide (REGLAN) tablet 10 mg  10 mg Oral TID AC & HS Delfin Gant, NP   10 mg at 07/20/15 0957  . multivitamin with minerals tablet 1 tablet  1 tablet Oral Daily Delfin Gant, NP   1 tablet at 07/20/15 0959  . naltrexone (DEPADE) tablet 50 mg  50 mg Oral Daily Delfin Gant, NP   50 mg at 07/20/15 0958  . nicotine (NICODERM CQ - dosed in mg/24 hours) patch 21 mg  21 mg Transdermal Daily Nicholaus Bloom, MD   21 mg at 07/20/15 0956  . Oxcarbazepine (TRILEPTAL) tablet 300 mg  300 mg Oral BID Kerrie Buffalo, NP      . pantoprazole (PROTONIX) EC tablet 40 mg  40 mg Oral Daily Delfin Gant, NP   40 mg at 07/20/15 0957  . pneumococcal 23 valent vaccine (PNU-IMMUNE) injection 0.5 mL  0.5 mL Intramuscular Tomorrow-1000 Nicholaus Bloom, MD      . potassium chloride SA (K-DUR,KLOR-CON) CR tablet 20 mEq  20 mEq Oral Daily Delfin Gant, NP   20 mEq at 07/20/15 1001  . QUEtiapine (SEROQUEL XR) 24 hr tablet 400 mg  400 mg Oral Q2000  Kerrie Buffalo, NP      . QUEtiapine (SEROQUEL) 50 MG tablet           . QUEtiapine (SEROQUEL) tablet 25 mg  25 mg Oral TID PRN [name], MD      . traZODone (DESYREL) tablet 150 mg  150 mg Oral QHS Delfin Gant, NP   150 mg at 07/19/15 2158    Lab Results:  Results for orders placed or performed during the hospital encounter of 07/19/15 (from the past 48 hour(s))  Glucose, capillary     Status: Abnormal   Collection Time: 07/19/15  9:52 PM  Result Value Ref Range   Glucose-Capillary 110 (H) 65 - 99 mg/dL  Glucose, capillary     Status: Abnormal   Collection Time: 07/20/15  6:08 AM  Result Value Ref Range   Glucose-Capillary 115 (H) 65 - 99 mg/dL  Glucose, capillary     Status: Abnormal   Collection Time: 07/20/15 11:54 AM  Result Value Ref Range   Glucose-Capillary 100 (H) 65 - 99 mg/dL   Comment 1 Notify RN    Comment 2 Document in Chart     Physical Findings: AIMS: Facial and Oral Movements Muscles of Facial Expression: None, normal Lips and Perioral Area: None, normal Jaw: None, normal Tongue: None, normal,Extremity Movements Upper (arms, wrists, hands, fingers): None, normal Lower (legs, knees, ankles, toes): None, normal, Trunk Movements Neck, shoulders, hips: None, normal, Overall Severity Severity of abnormal movements (  highest score from questions above): None, normal Incapacitation due to abnormal movements: None, normal Patient's awareness of abnormal movements (rate only patient's report): No Awareness, Dental Status Current problems with teeth and/or dentures?: No Does patient usually wear dentures?: No  CIWA:    COWS:     Musculoskeletal: Strength & Muscle Tone: within normal limits Gait & Station: normal Patient leans: N/A  Psychiatric Specialty Exam: Review of Systems  All other systems reviewed and are negative.   Blood pressure 100/72, pulse 94, temperature 98.9 F (37.2 C), temperature source Oral, resp. rate 16, height 5\' 4"  (1.626  m), weight 102.513 kg (226 lb), SpO2 100 %.Body mass index is 38.77 kg/(m^2).   General Appearance: Casual  Eye Contact:: Good  Speech: Normal Rate  Volume: Decreased  Mood: Anxious and Depressed  Affect: Appropriate, Depressed and Flat  Thought Process: Coherent  Orientation: Full (Time, Place, and Person)  Thought Content: Rumination  Suicidal Thoughts: No  Homicidal Thoughts: No  Memory: Immediate; Fair Recent; Fair Remote; Fair  Judgement: Fair  Insight: Fair  Psychomotor Activity: Normal  Concentration: Good  Recall: Good  Fund of Knowledge:Good  Language: Good  Akathisia: Negative  Handed: Right  AIMS (if indicated):    Assets: Communication Skills Resilience  ADL's: Intact  Cognition: WNL  Sleep: Number of Hours: 6.75       Treatment Plan Summary: Review of chart, vital signs, medications, and notes.  1-Individual and group therapy  2-Medication management for depression and anxiety: Medications reviewed with the patient.  benztropine (COGENTIN) 0.5 MG tablet 2 (two) times daily for EPS gabapentin (NEURONTIN) 400 MG capsule2 (two) times daily for anxiety Oxcarbazepine (TRILEPTAL) 300 MG tablet twice daily for mood sx haloperidol (HALDOL) 10 MG tablet every evening for psychosis haloperidol (HALDOL) 5 MG tablet daily with breakfast for psychosis hydrOXYzine (ATARAX/VISTARIL) 25 MG tablet every 6 (six) hours as needed for anxiety.  lamoTRIgine (LAMICTAL) 25 MG tablet 2 (two) times daily.  QUEtiapine (SEROQUEL XR) 400 MG 24 hr tablet daily at 8 pm. traZODone (DESYREL) 150 MG tablet insomnia 3-Coping skills for depression, anxiety  4-Continue crisis stabilization and management  5-Address health issues--monitoring vital signs, stable  6-Treatment plan in progress to prevent relapse of depression and anxiety  Pranathi Winfree May Chattie Greeson, NP AGNP-BC 07/20/2015, 5:21 PM  I agreed with the findings, treatment and  disposition plan of this patient.  Berniece Andreas, MD
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
## 2015-07-20 NOTE — Progress Notes (Signed)
Did not attend group 

## 2015-07-20 NOTE — [facility] Group Notes (Signed)
[facility] LCSW Group Therapy   07/20/2015 1:50 PM  Type of Therapy: Group Therapy  Participation Level: Invited. Chose not to attend.  Summary of Progress/Problems: Chaplain was here to lead a group on themes of hope and/or courage.   Georga Kaufmann 07/20/2015 1:50 PM
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
## 2015-07-20 NOTE — Progress Notes (Signed)
NUTRITION ASSESSMENT  Pt identified as at risk on the Malnutrition Screen Tool  INTERVENTION: 1. Educated patient on the importance of nutrition and encouraged intake of food and beverages. 2. Discussed weight goals. 3. Supplements: none at this time  NUTRITION DIAGNOSIS: Unintentional weight loss related to sub-optimal intake as evidenced by pt report.   Goal: Pt to meet >/= 90% of their estimated nutrition needs.  Monitor:  PO intake  Assessment:  Pt seen for MST. She was admitted for increased depression with associated SI. She has, per notes, a hx of suicide attempts. Since admission, pt's appetite has been good/improved. PTA she was losing weight with chart indicating she lost 14 lbs (6% body weight) in the past 4-5 months which is not significant for time frame.   50 y.o. female  Height: Ht Readings from Last 1 Encounters:  07/19/15 5\' 4"  (1.626 m)    Weight: Wt Readings from Last 1 Encounters:  07/19/15 226 lb (102.513 kg)    Weight Hx: Wt Readings from Last 10 Encounters:  07/19/15 226 lb (102.513 kg)  04/03/15 233 lb (105.688 kg)  03/08/15 240 lb (108.863 kg)  12/14/14 237 lb (107.502 kg)  12/08/14 231 lb 0.7 oz (104.8 kg)  11/23/14 235 lb (106.595 kg)  09/14/14 239 lb (108.41 kg)  09/07/14 239 lb (108.41 kg)  08/15/14 239 lb (108.41 kg)  07/21/14 218 lb (98.884 kg)    BMI:  Body mass index is 38.77 kg/(m^2). Pt meets criteria for obesity based on current BMI.  Estimated Nutritional Needs: Kcal: 25-30 kcal/kg Protein: > 1 gram protein/kg Fluid: 1 ml/kcal  Diet Order:   Pt is also offered choice of unit snacks mid-morning and mid-afternoon.  Pt is eating as desired.   Lab results and medications reviewed.     Jarome Matin, RD, LDN Inpatient Clinical Dietitian Pager # [phone] After hours/weekend pager # [phone]

## 2015-07-21 DIAGNOSIS — F250 Schizoaffective disorder, bipolar type: Principal | ICD-10-CM

## 2015-07-21 LAB — GLUCOSE, CAPILLARY
GLUCOSE-CAPILLARY: 73 mg/dL (ref 65–99)
Glucose-Capillary: 94 mg/dL (ref 65–99)
Glucose-Capillary: 85 mg/dL (ref 65–99)
Glucose-Capillary: 160 mg/dL — ABNORMAL HIGH (ref 65–99)
Glucose-Capillary: 81 mg/dL (ref 65–99)

## 2015-07-21 LAB — LIPID PANEL
CHOL/HDL RATIO: 3 ratio
CHOLESTEROL: 145 mg/dL (ref 0–200)
HDL: 48 mg/dL (ref >40)
LDL CALC: 63 mg/dL (ref 0–99)
Triglycerides: 170 mg/dL — ABNORMAL HIGH (ref <150)
VLDL: 34 mg/dL (ref 0–40)

## 2015-07-21 NOTE — Progress Notes (Signed)
Patient ID: Maureen Swanson, female   DOB: 23-Feb-1966, 50 y.o.   MRN: [id number] D: Client spends most of this shift in bed except up for medications , reports depression "8" of 10, acknowledges SI "every now and then" but does contract for safety, denies plan. Client goals "to have less depression, relieve voices"  "still hear a little" A: Financial trader to attend group. Provided emotional support. Staff will monitor q25min for safety. R: Client is safe on the unit. Did not attend group.

## 2015-07-21 NOTE — [facility] Group Notes (Signed)
[place] Group Notes: (Clinical Social Work)   07/21/2015      Type of Therapy:  Group Therapy   Participation Level:  Did Not Attend despite MHT prompting   Selmer Dominion, LCSW 07/21/2015, 1:07 PM

## 2015-07-21 NOTE — Progress Notes (Signed)
D [place] is status quo. She is flat, sad and quite depressed. She avoids eye contact. She sits with her head hung down She  Does not ininitiate  conversation with staff and / or with her peers. She takes her meds as scheduled and CBG's are monitored as ordered. She completed her daily assessment and on it she wrote she rated her depression, hopelessness and anxiety " 8/9/9/", respectively. She writes she has enteratained suicidal ideation today and when this nurse questions her about this she replies " not here....". A She sleeps in between her meals , which is evidence of her depression as well as her lack of engagement. R Safety is in place and poc cont.

## 2015-07-22 LAB — GLUCOSE, CAPILLARY
GLUCOSE-CAPILLARY: 84 mg/dL (ref 65–99)
GLUCOSE-CAPILLARY: 127 mg/dL — AB (ref 65–99)
GLUCOSE-CAPILLARY: 119 mg/dL — AB (ref 65–99)
Glucose-Capillary: 84 mg/dL (ref 65–99)

## 2015-07-22 MED ORDER — ATENOLOL 50 MG PO TABS
50.0000 mg | ORAL_TABLET | Freq: Every day | ORAL | Status: DC
  Administered 2015-07-23 – 2015-08-02 (×11): 50 mg via ORAL
  Filled 2015-07-22 (×10): qty 1
  Filled 2015-07-22: qty 7
  Filled 2015-07-22 (×2): qty 1

## 2015-07-22 NOTE — Progress Notes (Addendum)
[facility] MD Progress Note  07/22/2015 11:17 AM Maureen Swanson  MRN:  [id number] Subjective:  "I still feel depressed.  I still have suicidal thoughts but am sleeping better.  I feel some time dizzy   Ojective:  Patient is 50 year old African-American female who was admitted due to suicidal thoughts, hallucination and plan to stab herself in the stomach.   Patient seen chart reviewed.  Patient is taking multiple medication.  She is taking Seroquel and Haldol.  Despite taking higher dose of antipsychotic she still have hallucination and paranoia and continued to endorse suicidal thoughts.  However she sleeping better.  She needs a lot of encouragement to attend groups.  Her affect remains flat depressed.  She is isolated and most of the time stays in her room by herself.  She reported no side effects of medication.  She has no tremors or shakes.  She is not disruptive but continued to endorse psychotic and depressive symptoms.  Principal Problem: Schizoaffective disorder, bipolar type ([place]) Diagnosis:   Patient Active Problem List   Diagnosis Date Noted  . Tobacco use disorder [F17.200] 07/20/2015  . Diabetes mellitus ([place]) [E11.9] 07/20/2015  . Suicidal ideation [R45.851]   . Cocaine use disorder, moderate, dependence ([place]) [F14.20] 04/03/2015  . Schizoaffective disorder, bipolar type ([place]) [F25.0] 04/03/2015  . Diabetes ([place]) [E11.9] 04/03/2015  . Alcohol use disorder, moderate, dependence ([place]) [F10.20] 04/03/2015  . Conversion disorder [F44.9] 12/09/2014  . Acute ischemic stroke ([place]) [I63.9] 12/08/2014  . Left-sided weakness [M62.89] 12/08/2014  . 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 Knee pain [M25.569] 10/17/2010  . HYPERGLYCEMIA [R73.09] 08/16/2010  . MAMMOGRAM, ABNORMAL, RIGHT [R92.8] 08/05/2010  . HYPERLIPIDEMIA [E78.5] 06/21/2010  . AMENORRHEA, SECONDARY [N91.2] 10/29/2009  . 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 HELICOBACTER PYLORI INFECTION [A04.8] 04/02/2009  .  GERD [K21.9] 10/11/2007  . Essential hypertension [I10] 02/26/2007   Total Time spent with patient: 20 minutes  Past Psychiatric History: see above noted  Past Medical History:  Past Medical History  Diagnosis Date  . High cholesterol   . Depression   . Gout   . Anxiety   . Bipolar 1 disorder ([place])   . GERD (gastroesophageal reflux disease)   . Substance abuse     crack cocaine  . Headache(784.0)     migraines  . TMJ (temporomandibular joint disorder)   . Asthma     daily and prn inhalers  . Arthritis     back and knees  . Gastric ulcer   . 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 Hypertension     under control, has been on med. x 12 yrs.  . Diabetes mellitus     diet-controlled  . CHF (congestive heart failure) [facility])     Past Surgical History  Procedure Laterality Date  . Knee arthroscopy w/ partial medial meniscectomy  05/01/2010    right  . Breast lumpectomy with needle localization  04/19/2012    Procedure: BREAST LUMPECTOMY WITH NEEDLE LOCALIZATION;  Surgeon: [name], MD;  Location: [place];  Service: General;  Laterality: Right;   Family History:  Family History  Problem Relation Age of Onset  . Diabetes Mother   . Breast cancer Mother   . Cancer Father   . Bipolar disorder Maternal Aunt   . Schizophrenia Maternal Grandfather   . Alcoholism Maternal Uncle    Family Psychiatric  History:  Schizophrenia, grandfather Social History:  History  Alcohol  Use  . 0.0 oz/week    Comment: reports she drinks 3 40 ounce beers daily     History  Drug Use No    Comment: last use 1/30 (07/18/15)    Social History   Social History  . Marital Status: Single    Spouse Name: N/A  . Number of Children: N/A  . Years of Education: N/A   Social History Main Topics  . Smoking status: Current Every Day Smoker -- 0.50 packs/day for 15 years    Types: Cigarettes    Last Attempt to Quit: 08/02/2012  . Smokeless tobacco: Never  Used  . Alcohol Use: 0.0 oz/week     Comment: reports she drinks 3 40 ounce beers daily  . Drug Use: No     Comment: last use 1/30 (07/18/15)  . Sexual Activity: Not Asked   Other Topics Concern  . None   Social History Narrative   Additional Social History:     Sleep: Fair  Appetite:  Fair  Current Medications: Current Facility-Administered Medications  Medication Dose Route Frequency Provider Last Rate Last Dose  . acetaminophen (TYLENOL) tablet 650 mg  650 mg Oral Q6H PRN Delfin Gant, NP      . alum & mag hydroxide-simeth (MAALOX/MYLANTA) 200-200-20 MG/5ML suspension 30 mL  30 mL Oral Q4H PRN Delfin Gant, NP      . amLODipine (NORVASC) tablet 5 mg  5 mg Oral Daily Delfin Gant, NP   Stopped at 07/22/15 1010  . aspirin chewable tablet 81 mg  81 mg Oral Daily Delfin Gant, NP   81 mg at 07/22/15 1003  . atenolol (TENORMIN) tablet 100 mg  100 mg Oral Daily Delfin Gant, NP   Stopped at 07/22/15 1010  . atorvastatin (LIPITOR) tablet 40 mg  40 mg Oral QPM Delfin Gant, NP   40 mg at 07/21/15 1823  . baclofen (LIORESAL) tablet 10 mg  10 mg Oral TID Delfin Gant, NP   10 mg at 07/22/15 1003  . benztropine (COGENTIN) tablet 0.5 mg  0.5 mg Oral BID Delfin Gant, NP   0.5 mg at 07/22/15 1005  . calcium carbonate (OS-CAL - dosed in mg of elemental calcium) tablet 500 mg of elemental calcium  1 tablet Oral Q breakfast Delfin Gant, NP   500 mg of elemental calcium at 07/22/15 1004  . celecoxib (CELEBREX) capsule 100 mg  100 mg Oral BID Delfin Gant, NP   100 mg at 07/22/15 1006  . folic acid (FOLVITE) tablet 1 mg  1 mg Oral Daily Delfin Gant, NP   1 mg at 07/22/15 1005  . furosemide (LASIX) tablet 40 mg  40 mg Oral Daily Delfin Gant, NP   40 mg at 07/22/15 1003  . gabapentin (NEURONTIN) capsule 400 mg  400 mg Oral BID Delfin Gant, NP   400 mg at 07/22/15 1003  . haloperidol (HALDOL) tablet 10 mg  10 mg Oral QPM  Delfin Gant, NP   10 mg at 07/20/15 1723  . haloperidol (HALDOL) tablet 5 mg  5 mg Oral Q breakfast Delfin Gant, NP   5 mg at 07/22/15 1006  . hydrochlorothiazide (HYDRODIURIL) tablet 25 mg  25 mg Oral Daily Delfin Gant, NP   Stopped at 07/22/15 1011  . hydrOXYzine (ATARAX/VISTARIL) tablet 25 mg  25 mg Oral Q6H PRN Delfin Gant, NP   25 mg at 07/19/15 2158  . insulin  aspart (novoLOG) injection 0-9 Units  0-9 Units Subcutaneous TID WC Delfin Gant, NP   2 Units at 07/20/15 1722  . insulin glargine (LANTUS) injection 30 Units  30 Units Subcutaneous QHS Delfin Gant, NP   30 Units at 07/20/15 2155  . lamoTRIgine (LAMICTAL) tablet 25 mg  25 mg Oral BID Delfin Gant, NP   25 mg at 07/22/15 1004  . linagliptin (TRADJENTA) tablet 5 mg  5 mg Oral Daily Delfin Gant, NP   5 mg at 07/22/15 1005  . loratadine (CLARITIN) tablet 10 mg  10 mg Oral Daily Delfin Gant, NP   10 mg at 07/22/15 1005  . magnesium hydroxide (MILK OF MAGNESIA) suspension 30 mL  30 mL Oral Daily PRN Delfin Gant, NP      . metFORMIN (GLUCOPHAGE) tablet 1,000 mg  1,000 mg Oral BID WC Delfin Gant, NP   1,000 mg at 07/22/15 1005  . metoCLOPramide (REGLAN) tablet 10 mg  10 mg Oral TID AC & HS Delfin Gant, NP   10 mg at 07/22/15 1004  . multivitamin with minerals tablet 1 tablet  1 tablet Oral Daily Delfin Gant, NP   1 tablet at 07/22/15 1006  . naltrexone (DEPADE) tablet 50 mg  50 mg Oral Daily Delfin Gant, NP   50 mg at 07/22/15 1004  . nicotine (NICODERM CQ - dosed in mg/24 hours) patch 21 mg  21 mg Transdermal Daily Nicholaus Bloom, MD   21 mg at 07/22/15 1002  . Oxcarbazepine (TRILEPTAL) tablet 300 mg  300 mg Oral BID Kerrie Buffalo, NP   300 mg at 07/22/15 1003  . pantoprazole (PROTONIX) EC tablet 40 mg  40 mg Oral Daily Delfin Gant, NP   40 mg at 07/22/15 1003  . potassium chloride SA (K-DUR,KLOR-CON) CR tablet 20 mEq  20 mEq Oral Daily  Delfin Gant, NP   20 mEq at 07/22/15 1006  . QUEtiapine (SEROQUEL XR) 24 hr tablet 400 mg  400 mg Oral Q2000 Kerrie Buffalo, NP   400 mg at 07/21/15 2012  . QUEtiapine (SEROQUEL) tablet 25 mg  25 mg Oral TID PRN [name], MD      . traZODone (DESYREL) tablet 150 mg  150 mg Oral QHS Delfin Gant, NP   150 mg at 07/21/15 2247    Lab Results:  Results for orders placed or performed during the hospital encounter of 07/19/15 (from the past 48 hour(s))  Glucose, capillary     Status: Abnormal   Collection Time: 07/20/15 11:54 AM  Result Value Ref Range   Glucose-Capillary 100 (H) 65 - 99 mg/dL   Comment 1 Notify RN    Comment 2 Document in Chart   Glucose, capillary     Status: Abnormal   Collection Time: 07/20/15  5:10 PM  Result Value Ref Range   Glucose-Capillary 169 (H) 65 - 99 mg/dL   Comment 1 Notify RN   Glucose, capillary     Status: Abnormal   Collection Time: 07/20/15  9:28 PM  Result Value Ref Range   Glucose-Capillary 132 (H) 65 - 99 mg/dL  Glucose, capillary     Status: None   Collection Time: 07/21/15  5:46 AM  Result Value Ref Range   Glucose-Capillary 94 65 - 99 mg/dL  Lipid panel     Status: Abnormal   Collection Time: 07/21/15  6:30 AM  Result Value Ref Range   Cholesterol 145 0 -  200 mg/dL   Triglycerides 170 (H) <150 mg/dL   HDL 48 >40 mg/dL   Total CHOL/HDL Ratio 3.0 RATIO   VLDL 34 0 - 40 mg/dL   LDL Cholesterol 63 0 - 99 mg/dL    Comment:        Total Cholesterol/HDL:CHD Risk Coronary Heart Disease Risk Table                     Men   Women  1/2 Average Risk   3.4   3.3  Average Risk       5.0   4.4  2 X Average Risk   9.6   7.1  3 X Average Risk  23.4   11.0        Use the calculated Patient Ratio above and the CHD Risk Table to determine the patient's CHD Risk.        ATP III CLASSIFICATION (LDL):  <100     mg/dL   Optimal  100-129  mg/dL   Near or Above                    Optimal  130-159  mg/dL   Borderline  160-189  mg/dL    High  >190     mg/dL   Very High Performed at [facility]   Glucose, capillary     Status: None   Collection Time: 07/21/15 12:03 PM  Result Value Ref Range   Glucose-Capillary 85 65 - 99 mg/dL  Glucose, capillary     Status: Abnormal   Collection Time: 07/21/15  5:22 PM  Result Value Ref Range   Glucose-Capillary 160 (H) 65 - 99 mg/dL  Glucose, capillary     Status: None   Collection Time: 07/21/15  8:31 PM  Result Value Ref Range   Glucose-Capillary 73 65 - 99 mg/dL  Glucose, capillary     Status: None   Collection Time: 07/21/15 10:52 PM  Result Value Ref Range   Glucose-Capillary 81 65 - 99 mg/dL  Glucose, capillary     Status: None   Collection Time: 07/22/15  5:50 AM  Result Value Ref Range   Glucose-Capillary 84 65 - 99 mg/dL    Physical Findings: AIMS: Facial and Oral Movements Muscles of Facial Expression: None, normal Lips and Perioral Area: None, normal Jaw: None, normal Tongue: None, normal,Extremity Movements Upper (arms, wrists, hands, fingers): None, normal Lower (legs, knees, ankles, toes): None, normal, Trunk Movements Neck, shoulders, hips: None, normal, Overall Severity Severity of abnormal movements (highest score from questions above): None, normal Incapacitation due to abnormal movements: None, normal Patient's awareness of abnormal movements (rate only patient's report): No Awareness, Dental Status Current problems with teeth and/or dentures?: No Does patient usually wear dentures?: No  CIWA:    COWS:     Musculoskeletal: Strength & Muscle Tone: within normal limits Gait & Station: normal Patient leans: N/A  Psychiatric Specialty Exam: Review of Systems  Neurological: Positive for dizziness. Negative for tremors.  Psychiatric/Behavioral: Positive for depression and hallucinations.  All other systems reviewed and are negative.   Blood pressure 95/55, pulse 90, temperature 98.6 F (37 C), temperature source Oral, resp. rate 18,  height 5\' 4"  (1.626 m), weight 102.513 kg (226 lb), SpO2 100 %.Body mass index is 38.77 kg/(m^2).   General Appearance: Casual  Eye Contact:: Fair   Speech: Normal Rate  Volume: Decreased  Mood: Anxious and Depressed  Affect: Appropriate, Depressed and Flat  Thought Process:  Coherent  Orientation: Full (Time, Place, and Person)  Thought Content: Rumination, paranoia, hallucination   Suicidal Thoughts:Suicidal thoughts and vague plan about stabbing herself.   Homicidal Thoughts: No  Memory: Immediate; Fair Recent; Fair Remote; Fair  Judgement: Fair  Insight: Fair  Psychomotor Activity:Decreased   Concentration: Good  Recall: [name] Cluck of Knowledge:Good  Language: Good  Akathisia: Negative  Handed: Right  AIMS (if indicated):    Assets: Communication Skills Resilience  ADL's: Intact  Cognition: WNL  Sleep: Number of Hours: 6.75       Treatment Plan Summary: Review of chart, vital signs, medications, and notes.  1-Individual and group therapy  2-Medication management for depression and anxiety: Medications reviewed with the patient. Decrease Atenolol 50 mg as patient complaining of dizziness.  She is taking multiple medication.  We will also call diabetic consult to address variable blood sugar results. benztropine (COGENTIN) 0.5 MG tablet 2 (two) times daily for EPS gabapentin (NEURONTIN) 400 MG capsule2 (two) times daily for anxiety Oxcarbazepine (TRILEPTAL) 300 MG tablet twice daily for mood sx haloperidol (HALDOL) 10 MG tablet every evening for psychosis haloperidol (HALDOL) 5 MG tablet daily with breakfast for psychosis hydrOXYzine (ATARAX/VISTARIL) 25 MG tablet every 6 (six) hours as needed for anxiety.  lamoTRIgine (LAMICTAL) 25 MG tablet 2 (two) times daily.  QUEtiapine (SEROQUEL XR) 400 MG 24 hr tablet daily at 8 pm. traZODone (DESYREL) 150 MG tablet insomnia 3-Coping skills for depression, anxiety  4-Continue  crisis stabilization and management  5-Address health issues--monitoring vital signs, stable.  Labs reviewed.  Creatinine 1.29.  Hemoglobin A1c and prolactin level pending 6-Treatment plan in progress to prevent relapse of depression and anxiety  Dominik Yordy T., MD  07/22/2015, 11:17 AM

## 2015-07-22 NOTE — Progress Notes (Signed)
Maureen Swanson says she is not feeling " that good" today. This morning , she stayed in the bed the whole morning.      She is depressed, flat and says " Im not eating that good ...what do you think I should do?". A She presented to this nurse a few minutes before lunch and said " I don't feel good..today do  you think you could check my sugar?". CBG was 84. Pt was given 2 cups of low fat yogurt and 1 cup of giingerale. Bp 119/74 and HR 114. Skin warm and dry.  A This nurse discussed with Dr. Adele Schilder. Writer apprised him that all BP meds had been held at 0800 med pass and that pt's blood sugars and lantus insulin needed adjustment due to pt's poor dieatray intake. R Tenormin decreased and dietary consult made.

## 2015-07-22 NOTE — [facility] Group Notes (Signed)
[place] Group Notes:  (Clinical Social Work)  07/22/2015  [place] Group Notes:  (Clinical Social Work)  07/22/2015  11:00AM-12:00PM  Summary of Progress/Problems:  The main focus of today's process group was to listen to a variety of genres of music and to identify that different types of music provoke different responses.  The patient then was able to identify personally what was soothing for them, as well as energizing. The patient expressed understanding of concepts, as well as knowledge of how each type of music affected her and how this can be used at home as a wellness/recovery tool.  She left the group prior to the end, and apologized in the hall afterward, saying her blood sugar had dropped and she had felt sick.  Type of Therapy:  Music Therapy   Participation Level:  Active  Participation Quality:  Attentive  Affect:  Blunted  Cognitive:  Oriented  Insight:  Engaged  Engagement in Therapy:  Engaged  Modes of Intervention:   Activity, Exploration  Selmer Dominion, LCSW 07/22/2015

## 2015-07-22 NOTE — Progress Notes (Signed)
Psychoeducational Group Note  Date:  07/22/2015 Time:  0144  Group Topic/Focus:  Wrap-Up Group:   The focus of this group is to help patients review their daily goal of treatment and discuss progress on daily workbooks.  Participation Level: Did Not Attend  Participation Quality:  Not Applicable  Affect:  Not Applicable  Cognitive:  Not Applicable  Insight:  Not Applicable  Engagement in Group: Not Applicable  Additional Comments:  Patient did not attend group since she was asleep in her room at that time.   Barry Culverhouse S 07/22/2015, 1:44 AM

## 2015-07-22 NOTE — Plan of Care (Signed)
Problem: Alteration in mood & ability to function due to Goal: STG-Patient will comply with prescribed medication regimen (Patient will comply with prescribed medication regimen)  Outcome: Progressing Client is compliant with medication regime AEB taking medications as prescribed without incident.

## 2015-07-23 ENCOUNTER — Ambulatory Visit: Payer: Self-pay

## 2015-07-23 DIAGNOSIS — R45850 Homicidal ideations: Secondary | ICD-10-CM

## 2015-07-23 DIAGNOSIS — R45851 Suicidal ideations: Secondary | ICD-10-CM

## 2015-07-23 LAB — GLUCOSE, CAPILLARY
GLUCOSE-CAPILLARY: 87 mg/dL (ref 65–99)
GLUCOSE-CAPILLARY: 129 mg/dL — AB (ref 65–99)
Glucose-Capillary: 140 mg/dL — ABNORMAL HIGH (ref 65–99)
Glucose-Capillary: 111 mg/dL — ABNORMAL HIGH (ref 65–99)

## 2015-07-23 LAB — HEMOGLOBIN A1C
Hgb A1c MFr Bld: 5.7 % — ABNORMAL HIGH (ref 4.8–5.6)
Mean Plasma Glucose: 117 mg/dL

## 2015-07-23 LAB — PROLACTIN: PROLACTIN: 141.3 ng/mL — AB (ref 4.8–23.3)

## 2015-07-23 MED ORDER — TRAZODONE HCL 50 MG PO TABS
ORAL_TABLET | ORAL | Status: AC
Start: 2015-07-23 — End: 2015-07-24
  Filled 2015-07-23: qty 1

## 2015-07-23 MED ORDER — RISPERIDONE 0.5 MG PO TABS
0.5000 mg | ORAL_TABLET | Freq: Three times a day (TID) | ORAL | Status: DC
  Filled 2015-07-23 (×2): qty 1

## 2015-07-23 MED ORDER — TRAZODONE HCL 150 MG PO TABS
175.0000 mg | ORAL_TABLET | Freq: Every day | ORAL | Status: DC
  Administered 2015-07-23 – 2015-07-26 (×4): 175 mg via ORAL
  Filled 2015-07-23 (×6): qty 1

## 2015-07-23 MED ORDER — ARIPIPRAZOLE 5 MG PO TABS
5.0000 mg | ORAL_TABLET | Freq: Two times a day (BID) | ORAL | Status: DC
  Administered 2015-07-23 – 2015-07-24 (×2): 5 mg via ORAL
  Filled 2015-07-23 (×9): qty 1

## 2015-07-23 MED ORDER — BENZTROPINE MESYLATE 0.5 MG PO TABS: 0.5000 mg | ORAL_TABLET | Freq: Two times a day (BID) | ORAL | Status: DC | PRN

## 2015-07-23 MED ORDER — QUETIAPINE FUMARATE 25 MG PO TABS
25.0000 mg | ORAL_TABLET | Freq: Three times a day (TID) | ORAL | Status: DC | PRN
  Administered 2015-07-25 – 2015-08-01 (×6): 25 mg via ORAL
  Filled 2015-07-23 (×6): qty 1

## 2015-07-23 MED ORDER — RISPERIDONE 1 MG PO TBDP: 1.0000 mg | ORAL_TABLET | Freq: Three times a day (TID) | ORAL | Status: DC | PRN

## 2015-07-23 NOTE — [facility] Group Notes (Signed)
[place] LCSW Group Therapy  07/23/2015 5:41 PM  Type of Therapy:  Group Therapy  Participation Level:  Minimal  Participation Quality:  Drowsy  Affect:  Depressed  Cognitive:  Lacking  Insight:  Limited  Engagement in Therapy:  Limited  Modes of Intervention:  Discussion, Exploration and Problem-solving  Summary of Progress/Problems: Today's Topic: Overcoming Obstacles. Patients identified one short term goal and potential obstacles in reaching this goal. Patients processed barriers involved in overcoming these obstacles. Patients identified steps necessary for overcoming these obstacles and explored motivation (internal and external) for facing these difficulties head on. Today's Topic: Overcoming Obstacles. Patients identified one short term goal and potential obstacles in reaching this goal. Patients processed barriers involved in overcoming these obstacles. Patients identified steps necessary for overcoming these obstacles and explored motivation (internal and external) for facing these difficulties head on.  Patient slept off/on during group; however, was able to answer questions when prompted.  Discussed an obstacle as "trouble", states her biggest obstacle to taking medication is lack of a stable place to stay.  Feels she doesn't take medications regularly when she is on street during day, is working w [facility] to obtain stable shelter placement and agrees she can take small supply of medications w her.   Beverely Pace 07/23/2015, 5:41 PM

## 2015-07-23 NOTE — Plan of Care (Signed)
Problem: Alteration in mood & ability to function due to Goal: STG-Patient will attend groups Outcome: Progressing Pt attended scheduled groups this shift, was engaged and participated as well.

## 2015-07-23 NOTE — Progress Notes (Signed)
[facility] MD Progress Note  07/23/2015 3:16 PM JACKEY FORNSHELL  MRN:  [id number] Subjective:  "I feel depressed , suicidal and I am hearing voices asking me hurt myself and hurt others.'   Ojective:  [name] with hx of schizoaffective do , cocaine and alcohol abuse , is single , who was living with her boyfriend , who was admitted due to suicidal thoughts, hallucination and plan to stab herself in the stomach.    Patient seen chart reviewed. Pt discussed with treatment team. Pt today seen as depressed, withdrawn , continues to endorse SI with plan to OD, HI towards other people , has AH asking her to kill self and kill others and has sleep issues. Pt also with elevated PL level - hence discussed adding Abilify. She reports she has tried that in the past - but would like to try it again.  Per staff - pt is withdrawn , depressed , psychotic - continues to need a lot of encouragement and support.    Principal Problem: Schizoaffective disorder, bipolar type ([place]) Diagnosis:   Patient Active Problem List   Diagnosis Date Noted  . Tobacco use disorder [F17.200] 07/20/2015  . Diabetes mellitus ([place]) [E11.9] 07/20/2015  . Suicidal ideation [R45.851]   . Cocaine use disorder, moderate, dependence ([place]) [F14.20] 04/03/2015  . Schizoaffective disorder, bipolar type ([place]) [F25.0] 04/03/2015  . Diabetes ([place]) [E11.9] 04/03/2015  . Alcohol use disorder, moderate, dependence ([place]) [F10.20] 04/03/2015  . Conversion disorder [F44.9] 12/09/2014  . Acute ischemic stroke ([place]) [I63.9] 12/08/2014  . Left-sided weakness [M62.89] 12/08/2014  . 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 Knee pain [M25.569] 10/17/2010  . HYPERGLYCEMIA [R73.09] 08/16/2010  . MAMMOGRAM, ABNORMAL, RIGHT [R92.8] 08/05/2010  . HYPERLIPIDEMIA [E78.5] 06/21/2010  . AMENORRHEA, SECONDARY [N91.2] 10/29/2009  . 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 HELICOBACTER PYLORI INFECTION [A04.8]  04/02/2009  . GERD [K21.9] 10/11/2007  . Essential hypertension [I10] 02/26/2007   Total Time spent with patient: 30 minutes  Past Psychiatric History: see above noted  Past Medical History:  Past Medical History  Diagnosis Date  . High cholesterol   . Depression   . Gout   . Anxiety   . Bipolar 1 disorder ([place])   . GERD (gastroesophageal reflux disease)   . Substance abuse     crack cocaine  . Headache(784.0)     migraines  . TMJ (temporomandibular joint disorder)   . Asthma     daily and prn inhalers  . Arthritis     back and knees  . Gastric ulcer   . 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 Hypertension     under control, has been on med. x 12 yrs.  . Diabetes mellitus     diet-controlled  . CHF (congestive heart failure) [facility])     Past Surgical History  Procedure Laterality Date  . Knee arthroscopy w/ partial medial meniscectomy  05/01/2010    right  . Breast lumpectomy with needle localization  04/19/2012    Procedure: BREAST LUMPECTOMY WITH NEEDLE LOCALIZATION;  Surgeon: [name], MD;  Location: [place];  Service: General;  Laterality: Right;   Family History:  Family History  Problem Relation Age of Onset  . Diabetes Mother   . Breast cancer Mother   . Cancer Father   . Bipolar disorder Maternal Aunt   . Schizophrenia Maternal Grandfather   . Alcoholism Maternal Uncle  Family Psychiatric  History:  Schizophrenia, grandfather Social History:  History  Alcohol Use  . 0.0 oz/week    Comment: reports she drinks 3 40 ounce beers daily     History  Drug Use No    Comment: last use 1/30 (07/18/15)    Social History   Social History  . Marital Status: Single    Spouse Name: N/A  . Number of Children: N/A  . Years of Education: N/A   Social History Main Topics  . Smoking status: Current Every Day Smoker -- 0.50 packs/day for 15 years    Types: Cigarettes    Last Attempt to Quit: 08/02/2012  . Smokeless  tobacco: Never Used  . Alcohol Use: 0.0 oz/week     Comment: reports she drinks 3 40 ounce beers daily  . Drug Use: No     Comment: last use 1/30 (07/18/15)  . Sexual Activity: Not Asked   Other Topics Concern  . None   Social History Narrative   Additional Social History:     Sleep: Poor  Appetite:  Fair  Current Medications: Current Facility-Administered Medications  Medication Dose Route Frequency Provider Last Rate Last Dose  . acetaminophen (TYLENOL) tablet 650 mg  650 mg Oral Q6H PRN Delfin Gant, NP      . alum & mag hydroxide-simeth (MAALOX/MYLANTA) 200-200-20 MG/5ML suspension 30 mL  30 mL Oral Q4H PRN Delfin Gant, NP      . amLODipine (NORVASC) tablet 5 mg  5 mg Oral Daily Delfin Gant, NP   5 mg at 07/23/15 0835  . aspirin chewable tablet 81 mg  81 mg Oral Daily Delfin Gant, NP   81 mg at 07/23/15 0835  . atenolol (TENORMIN) tablet 50 mg  50 mg Oral Daily Kathlee Nations, MD   50 mg at 07/23/15 0835  . atorvastatin (LIPITOR) tablet 40 mg  40 mg Oral QPM Delfin Gant, NP   40 mg at 07/22/15 1810  . baclofen (LIORESAL) tablet 10 mg  10 mg Oral TID Delfin Gant, NP   10 mg at 07/23/15 1153  . benztropine (COGENTIN) tablet 0.5 mg  0.5 mg Oral BID Delfin Gant, NP   0.5 mg at 07/23/15 0835  . benztropine (COGENTIN) tablet 0.5 mg  0.5 mg Oral BID PRN [name], MD       And  . risperiDONE (RISPERDAL M-TABS) disintegrating tablet 1 mg  1 mg Oral TID PRN [name], MD      . calcium carbonate (OS-CAL - dosed in mg of elemental calcium) tablet 500 mg of elemental calcium  1 tablet Oral Q breakfast Delfin Gant, NP   500 mg of elemental calcium at 07/23/15 0836  . celecoxib (CELEBREX) capsule 100 mg  100 mg Oral BID Delfin Gant, NP   100 mg at 07/23/15 0836  . folic acid (FOLVITE) tablet 1 mg  1 mg Oral Daily Delfin Gant, NP   1 mg at 07/23/15 0836  . furosemide (LASIX) tablet 40 mg  40 mg Oral Daily Delfin Gant, NP   40 mg at 07/23/15 0836  . gabapentin (NEURONTIN) capsule 400 mg  400 mg Oral BID Delfin Gant, NP   400 mg at 07/23/15 0836  . hydrochlorothiazide (HYDRODIURIL) tablet 25 mg  25 mg Oral Daily Delfin Gant, NP   25 mg at 07/23/15 0836  . hydrOXYzine (ATARAX/VISTARIL) tablet 25 mg  25 mg Oral Q6H PRN  Delfin Gant, NP   25 mg at 07/19/15 2158  . insulin aspart (novoLOG) injection 0-9 Units  0-9 Units Subcutaneous TID WC Delfin Gant, NP   1 Units at 07/23/15 1256  . insulin glargine (LANTUS) injection 30 Units  30 Units Subcutaneous QHS Delfin Gant, NP   30 Units at 07/22/15 2231  . lamoTRIgine (LAMICTAL) tablet 25 mg  25 mg Oral BID Delfin Gant, NP   25 mg at 07/23/15 0836  . linagliptin (TRADJENTA) tablet 5 mg  5 mg Oral Daily Delfin Gant, NP   5 mg at 07/23/15 0836  . loratadine (CLARITIN) tablet 10 mg  10 mg Oral Daily Delfin Gant, NP   10 mg at 07/23/15 0835  . magnesium hydroxide (MILK OF MAGNESIA) suspension 30 mL  30 mL Oral Daily PRN Delfin Gant, NP      . metFORMIN (GLUCOPHAGE) tablet 1,000 mg  1,000 mg Oral BID WC Delfin Gant, NP   1,000 mg at 07/23/15 0835  . metoCLOPramide (REGLAN) tablet 10 mg  10 mg Oral TID AC & HS Delfin Gant, NP   10 mg at 07/23/15 1153  . multivitamin with minerals tablet 1 tablet  1 tablet Oral Daily Delfin Gant, NP   1 tablet at 07/23/15 0834  . naltrexone (DEPADE) tablet 50 mg  50 mg Oral Daily Delfin Gant, NP   50 mg at 07/23/15 0834  . nicotine (NICODERM CQ - dosed in mg/24 hours) patch 21 mg  21 mg Transdermal Daily Nicholaus Bloom, MD   21 mg at 07/22/15 1002  . Oxcarbazepine (TRILEPTAL) tablet 300 mg  300 mg Oral BID Kerrie Buffalo, NP   300 mg at 07/23/15 0834  . pantoprazole (PROTONIX) EC tablet 40 mg  40 mg Oral Daily Delfin Gant, NP   40 mg at 07/23/15 0834  . potassium chloride SA (K-DUR,KLOR-CON) CR tablet 20 mEq  20 mEq Oral Daily Delfin Gant, NP   20 mEq at 07/23/15 0834  . QUEtiapine (SEROQUEL XR) 24 hr tablet 400 mg  400 mg Oral Q2000 Kerrie Buffalo, NP   400 mg at 07/22/15 2042  . risperiDONE (RISPERDAL) tablet 0.5 mg  0.5 mg Oral TID WC [name], MD      . traZODone (DESYREL) tablet 150 mg  150 mg Oral QHS Delfin Gant, NP   150 mg at 07/22/15 2138    Lab Results:  Results for orders placed or performed during the hospital encounter of 07/19/15 (from the past 48 hour(s))  Glucose, capillary     Status: Abnormal   Collection Time: 07/21/15  5:22 PM  Result Value Ref Range   Glucose-Capillary 160 (H) 65 - 99 mg/dL  Glucose, capillary     Status: None   Collection Time: 07/21/15  8:31 PM  Result Value Ref Range   Glucose-Capillary 73 65 - 99 mg/dL  Glucose, capillary     Status: None   Collection Time: 07/21/15 10:52 PM  Result Value Ref Range   Glucose-Capillary 81 65 - 99 mg/dL  Glucose, capillary     Status: None   Collection Time: 07/22/15  5:50 AM  Result Value Ref Range   Glucose-Capillary 84 65 - 99 mg/dL  Glucose, capillary     Status: None   Collection Time: 07/22/15 11:57 AM  Result Value Ref Range   Glucose-Capillary 84 65 - 99 mg/dL  Glucose, capillary     Status:  Abnormal   Collection Time: 07/22/15  4:28 PM  Result Value Ref Range   Glucose-Capillary 127 (H) 65 - 99 mg/dL  Glucose, capillary     Status: Abnormal   Collection Time: 07/22/15  8:15 PM  Result Value Ref Range   Glucose-Capillary 119 (H) 65 - 99 mg/dL  Glucose, capillary     Status: None   Collection Time: 07/23/15  5:45 AM  Result Value Ref Range   Glucose-Capillary 87 65 - 99 mg/dL  Glucose, capillary     Status: Abnormal   Collection Time: 07/23/15 11:34 AM  Result Value Ref Range   Glucose-Capillary 140 (H) 65 - 99 mg/dL    Physical Findings: AIMS: Facial and Oral Movements Muscles of Facial Expression: None, normal Lips and Perioral Area: None, normal Jaw: None, normal Tongue: None, normal,Extremity  Movements Upper (arms, wrists, hands, fingers): None, normal Lower (legs, knees, ankles, toes): None, normal, Trunk Movements Neck, shoulders, hips: None, normal, Overall Severity Severity of abnormal movements (highest score from questions above): None, normal Incapacitation due to abnormal movements: None, normal Patient's awareness of abnormal movements (rate only patient's report): No Awareness, Dental Status Current problems with teeth and/or dentures?: No Does patient usually wear dentures?: No  CIWA:    COWS:     Musculoskeletal: Strength & Muscle Tone: within normal limits Gait & Station: normal Patient leans: N/A  Psychiatric Specialty Exam: Review of Systems  Psychiatric/Behavioral: Positive for depression, suicidal ideas, hallucinations and substance abuse. The patient is nervous/anxious.   All other systems reviewed and are negative.   Blood pressure 104/66, pulse 64, temperature 98.5 F (36.9 C), temperature source Oral, resp. rate 18, height 5\' 4"  (1.626 m), weight 102.513 kg (226 lb), SpO2 100 %.Body mass index is 38.77 kg/(m^2).   General Appearance: Casual  Eye Contact:: Fair   Speech: Normal Rate  Volume: Decreased  Mood: Anxious and Depressed  Affect: Appropriate, Depressed and Flat  Thought Process: Coherent  Orientation: Full (Time, Place, and Person)  Thought Content: Rumination, paranoia, hallucination - has command AH asking her to kill self and kill others  Suicidal Thoughts:Suicidal thoughts , with intent to OD   Homicidal Thoughts:yes , without intent  Memory: Immediate; Fair Recent; Fair Remote; Fair  Judgement: Fair  Insight: Fair  Psychomotor Activity:Decreased   Concentration: fair  Recall: Good  Fund of Knowledge:Good  Language: Good  Akathisia: Negative  Handed: Right  AIMS (if indicated):    Assets: Communication Skills Resilience  ADL's: Intact  Cognition: WNL  Sleep: Number of  Hours: 6.75       Treatment Plan Summary:[name] with hx of schizoaffective do , cocaine and alcohol abuse ,diabetes , who presented with worsening depressive sx and psychosis. Pt continues to remain withdrawn , psychotic. Will continue medication management.   Reviewed past medical records,treatment plan. Will continue Seroquel XR 400 mg po qhs for psychosis/sleep. Will discontinue Haldol for lack of efficacy. Will add Abilify for psychosis /as well as to augment the effect of seroquel. Will continue Trileptal 300 mg po bid for mood sx. Will continue Lamictal 25 mg po BID for mood lability. Will increase Trazodone to 175 mg po qhs for sleep. Will make available PRN medications as per agitation protocol. Will continue Gabapentin 400 mg po bid for anxiety/pain. Will restart home medications where indicated.  Will continue to monitor vitals ,medication compliance and treatment side effects while patient is here.  Will monitor for medical issues as well as call  consult as needed.  Reviewed labs ,prolactin elevated - Abilify will help with the same. Pt to follow up with outpt provider for monitoring . CSW will start working on disposition. Pt is motivated to go to a substance abuse program. Patient to participate in therapeutic milieu .       Jersey Espinoza, MD  07/23/2015, 3:16 PM

## 2015-07-23 NOTE — BHH Group Notes (Signed)
[facility] LCSW Aftercare Discharge Planning Group Note   07/23/2015 12:37 PM  Participation Quality:  Attentive, but little engagement   Mood/Affect:  Flat, lethargic   Depression Rating:  9  Anxiety Rating:  9  Thoughts of Suicide:  Yes Will you contract for safety?   Yes  Current AVH:  Yes, AH  Plans for discharge:  Wants referral for [facility] or [facility] for residential SA treatment, otherwise wants to return to [facility] for meds mgmt and therapy, current w this provider   Transportation Means: bus pass or facility transport   Supports: young adult son, 25, in [place], [place]da [place]

## 2015-07-23 NOTE — Progress Notes (Signed)
Did not attend group 

## 2015-07-23 NOTE — Plan of Care (Signed)
Problem: Alteration in mood & ability to function due to Goal: STG-Patient will report withdrawal symptoms Outcome: Progressing Pt denies withdrawal symptoms when assessed. Pt encouraged to voice concerns related to care to promote safety.

## 2015-07-23 NOTE — Progress Notes (Signed)
Patient ID: Maureen Swanson, female   DOB: 1965-06-19, 50 y.o.   MRN: [id number] D: Client isolated today also, in bed this shift, up for medication and snack. Client reports depression "8" of 10. A: Writer encourages client to get out of her room, go to get snacks and attend the group. Staff will monitor q32min for safety. R: client is safe on the unit, did not come in dayroom to socialize or attend group.

## 2015-07-23 NOTE — Progress Notes (Signed)
Nursing Note:  Patient mostly quiet during last part of shift, is in the Dayroom with eyes closed as patients are getting ready to go to supper.  CBG reading is 129, patient covered with one unit Novolog per ss order. Patient compliant with taking all of her scheduled 1700 and 1880 meds, denies any complaints, contracts for safety.

## 2015-07-23 NOTE — Clinical Social Work Note (Addendum)
Referrals made to [facility] and [facility] at patient request.  VM left for [facility] to follow up w CSW [employer].  Left VM for Laren Boom at [facility] and requested call back re referrals.   Edwyna Shell, LCSW Lead Clinical Social Worker Phone:  [phone]

## 2015-07-24 LAB — GLUCOSE, CAPILLARY
GLUCOSE-CAPILLARY: 80 mg/dL (ref 65–99)
Glucose-Capillary: 107 mg/dL — ABNORMAL HIGH (ref 65–99)
Glucose-Capillary: 94 mg/dL (ref 65–99)
Glucose-Capillary: 142 mg/dL — ABNORMAL HIGH (ref 65–99)
Glucose-Capillary: 66 mg/dL (ref 65–99)

## 2015-07-24 MED ORDER — ARIPIPRAZOLE 5 MG PO TABS
5.0000 mg | ORAL_TABLET | Freq: Every day | ORAL | Status: DC
  Administered 2015-07-25 – 2015-08-02 (×9): 5 mg via ORAL
  Filled 2015-07-24 (×11): qty 1
  Filled 2015-07-24: qty 7
  Filled 2015-07-24: qty 1

## 2015-07-24 MED ORDER — BENZTROPINE MESYLATE 0.5 MG PO TABS
0.5000 mg | ORAL_TABLET | Freq: Every day | ORAL | Status: DC
  Administered 2015-07-25 – 2015-08-02 (×9): 0.5 mg via ORAL
  Filled 2015-07-24 (×11): qty 1

## 2015-07-24 MED ORDER — ARIPIPRAZOLE 10 MG PO TABS
10.0000 mg | ORAL_TABLET | Freq: Every evening | ORAL | Status: DC
  Administered 2015-07-24 – 2015-08-01 (×9): 10 mg via ORAL
  Filled 2015-07-24 (×10): qty 1
  Filled 2015-07-24: qty 7

## 2015-07-24 MED ORDER — BENZTROPINE MESYLATE 0.5 MG PO TABS
0.5000 mg | ORAL_TABLET | Freq: Every evening | ORAL | Status: DC
  Administered 2015-07-24 – 2015-08-01 (×9): 0.5 mg via ORAL
  Filled 2015-07-24 (×11): qty 1

## 2015-07-24 NOTE — BHH Group Notes (Signed)
[facility] LCSW Group Therapy  07/24/2015 , 3:16 PM   Type of Therapy:  Group Therapy  Participation Level:  Active  Participation Quality:  Attentive  Affect:  Appropriate  Cognitive:  Alert  Insight:  Improving  Engagement in Therapy:  Engaged  Modes of Intervention:  Discussion, Exploration and Socialization  Summary of Progress/Problems: Today's group focused on the term Diagnosis.  Participants were asked to define the term, and then pronounce whether it is a negative, positive or neutral term. Stayed the entire time, attentive throughout. Minimal interaction.  States she feels like this is a good place for her "because people are encouraging and supportive here.'  [name] 07/24/2015 , 3:16 PM

## 2015-07-24 NOTE — Progress Notes (Addendum)
Hypoglycemic Event  CBG: 66  Treatment: 15 GM carbohydrate snack  Symptoms: None  Follow-up CBG: Time:2106 CBG Result:80  Possible Reasons for Event: Medication regimen: Patient got 1 unit of sliding scale insulin at dinner and is on Lantus at night  Comments/MD notified:Spencer Simon notified and orders to hold night time coverage.  Patient also refused to take her Insulin tonight stating if she were home she would not take it if it was that low.    Pricilla Riffle M

## 2015-07-24 NOTE — Progress Notes (Signed)
D: Pt was mainly in bed with eye closed for most of the evening. Pt at the time of assessment endorses severe anxiety and depression; states my depression is at a 9 and my anxiety at an 8. Pt however, denies pain, SI, HI and AVH. Pt remained calm and cooperative through the shift assessment. A: Medications offered as prescribed.  Support, encouragement, and safe environment provided.  15-minute safety checks continue. R: Pt was med compliant.  Pt did not attend group. Safety checks continue.

## 2015-07-24 NOTE — BHH Group Notes (Signed)
[facility] Group Notes:  (Nursing/MHT/Case Management/Adjunct)  Date:  07/24/2015  Time:  10:00  Type of Therapy:  Nurse Education  Participation Level:  Minimal  Participation Quality:  Attentive  Affect:  Flat  Cognitive:  Alert  Insight:  Limited  Engagement in Group:  None  Modes of Intervention:  Discussion and Education  Summary of Progress/Problems:  Group topic was Recovery.  Discussed setting appropriate goals, importance of sleeping and making sure to use coping skills.  Erin was attentive but did not participate in the discussion.  Barbette Or Delores Edelstein 07/24/2015, 11:39 AM

## 2015-07-24 NOTE — Progress Notes (Signed)
D:Patient in the dayroom on approach.  Patient appears to have a flat and depressed affect and a anxious and depressed mood.  Patient states she continues to be depressed and in pain.  Patient states she denies SI  But has hearing voices to harm someone.  Patient verbally contracts for safety.   A: Staff to monitor Q 15 mins for safety.  Encouragement and support offered.  Scheduled medications administered per orders.  Patient wanted to hold Lantus tonight dies to cbg being lower than she is used to. R: Patient remains safe on the unit.  Patient attended group tonight.  Patient visible on the unit and does not appear to be interacting with peers.  Patient taking administered medications

## 2015-07-24 NOTE — Progress Notes (Signed)
DAR Note: Maureen Swanson has been visible on the unit.  She attended AM group and took medications without difficulty.  She continues to report depression, suicidal thoughts and command hallucinations.  She is able to contract for safety on the unit.  She c/o feet pain 9/10 and scheduled medications given with minimal relief.  She did not complete her self inventory after much encouragement.  She goes to the cafeteria for meals.  Blood sugar at 12 noon was 94 requiring no coverage.  Encouraged continued participation in group and unit activities.  Q 15 minute checks maintained for safety.  We will continue to monitor the progress towards her goals.

## 2015-07-24 NOTE — Progress Notes (Signed)
Adult Psychoeducational Group Note  Date:  07/24/2015 Time:  8:48 PM  Group Topic/Focus:  Wrap-Up Group:   The focus of this group is to help patients review their daily goal of treatment and discuss progress on daily workbooks.  Participation Level:  Active  Participation Quality:  Appropriate  Affect:  Appropriate  Cognitive:  Appropriate  Insight: Appropriate  Engagement in Group:  Engaged  Modes of Intervention:  Discussion  Additional Comments:The patient expressed that she attended group.The patient also said that she had a good day.  Nash Shearer 07/24/2015, 8:48 PM

## 2015-07-24 NOTE — Progress Notes (Signed)
Maureen Swanson was able to complete her self inventory.  She reports that her anxiety, depression and hopelessness are 8/10.  She states that her goal today is to work on her "depression and boardness" and she will accomplish this goal by "talking to staff."  Encouraged continued participation in group and unit activities.  Q 15 minute checks maintained for safety.  We will continue to monitor the progress towards her goals.

## 2015-07-24 NOTE — Progress Notes (Signed)
[facility] MD Progress Note  07/24/2015 3:01 PM Maureen Swanson  MRN:  [id number] Subjective:  Pt states " I could not sleep last night because of my room mate. I feel the new medication is helping though.'    Ojective:  [name] with hx of schizoaffective do , cocaine and alcohol abuse , is single , who was living with her boyfriend , who was admitted due to suicidal thoughts, hallucination and plan to stab herself in the stomach.    Patient seen chart reviewed. Pt discussed with treatment team.  Pt today seen as depressed, withdrawn , continues to endorse SI with plan to cut self, also has HI towards other people.Pt reports mood as improved on the Abilify. Pt also reports AH that is derogatory. Pt reports sleep as affected since her room mate was disruptive at night. Per staff pt tolerating medications well, denies ADRS.     Principal Problem: Schizoaffective disorder, bipolar type ([place]) Diagnosis:   Patient Active Problem List   Diagnosis Date Noted  . Tobacco use disorder [F17.200] 07/20/2015  . Diabetes mellitus ([place]) [E11.9] 07/20/2015  . Suicidal ideation [R45.851]   . Cocaine use disorder, moderate, dependence ([place]) [F14.20] 04/03/2015  . Schizoaffective disorder, bipolar type ([place]) [F25.0] 04/03/2015  . Diabetes ([place]) [E11.9] 04/03/2015  . Alcohol use disorder, moderate, dependence ([place]) [F10.20] 04/03/2015  . Conversion disorder [F44.9] 12/09/2014  . Acute ischemic stroke ([place]) [I63.9] 12/08/2014  . Left-sided weakness [M62.89] 12/08/2014  . 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 Knee pain [M25.569] 10/17/2010  . HYPERGLYCEMIA [R73.09] 08/16/2010  . MAMMOGRAM, ABNORMAL, RIGHT [R92.8] 08/05/2010  . HYPERLIPIDEMIA [E78.5] 06/21/2010  . AMENORRHEA, SECONDARY [N91.2] 10/29/2009  . 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 HELICOBACTER PYLORI INFECTION [A04.8] 04/02/2009  . GERD [K21.9] 10/11/2007  . Essential hypertension [I10]  02/26/2007   Total Time spent with patient: 25 minutes  Past Psychiatric History: see above noted  Past Medical History:  Past Medical History  Diagnosis Date  . High cholesterol   . Depression   . Gout   . Anxiety   . Bipolar 1 disorder ([place])   . GERD (gastroesophageal reflux disease)   . Substance abuse     crack cocaine  . Headache(784.0)     migraines  . TMJ (temporomandibular joint disorder)   . Asthma     daily and prn inhalers  . Arthritis     back and knees  . Gastric ulcer   . 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 Hypertension     under control, has been on med. x 12 yrs.  . Diabetes mellitus     diet-controlled  . CHF (congestive heart failure) [facility])     Past Surgical History  Procedure Laterality Date  . Knee arthroscopy w/ partial medial meniscectomy  05/01/2010    right  . Breast lumpectomy with needle localization  04/19/2012    Procedure: BREAST LUMPECTOMY WITH NEEDLE LOCALIZATION;  Surgeon: [name], MD;  Location: [place];  Service: General;  Laterality: Right;   Family History:  Family History  Problem Relation Age of Onset  . Diabetes Mother   . Breast cancer Mother   . Cancer Father   . Bipolar disorder Maternal Aunt   . Schizophrenia Maternal Grandfather   . Alcoholism Maternal Uncle    Family Psychiatric  History:  Schizophrenia, grandfather Social History:  History  Alcohol Use  . 0.0  oz/week    Comment: reports she drinks 3 40 ounce beers daily     History  Drug Use No    Comment: last use 1/30 (07/18/15)    Social History   Social History  . Marital Status: Single    Spouse Name: N/A  . Number of Children: N/A  . Years of Education: N/A   Social History Main Topics  . Smoking status: Current Every Day Smoker -- 0.50 packs/day for 15 years    Types: Cigarettes    Last Attempt to Quit: 08/02/2012  . Smokeless tobacco: Never Used  . Alcohol Use: 0.0 oz/week     Comment: reports  she drinks 3 40 ounce beers daily  . Drug Use: No     Comment: last use 1/30 (07/18/15)  . Sexual Activity: Not Asked   Other Topics Concern  . None   Social History Narrative   Additional Social History:     Sleep: Poor  Appetite:  Fair  Current Medications: Current Facility-Administered Medications  Medication Dose Route Frequency Provider Last Rate Last Dose  . acetaminophen (TYLENOL) tablet 650 mg  650 mg Oral Q6H PRN Delfin Gant, NP      . alum & mag hydroxide-simeth (MAALOX/MYLANTA) 200-200-20 MG/5ML suspension 30 mL  30 mL Oral Q4H PRN Delfin Gant, NP      . amLODipine (NORVASC) tablet 5 mg  5 mg Oral Daily Delfin Gant, NP   5 mg at 07/24/15 0820  . ARIPiprazole (ABILIFY) tablet 10 mg  10 mg Oral QPM Rheanna Sergent, MD      . Derrill Memo ON 07/25/2015] ARIPiprazole (ABILIFY) tablet 5 mg  5 mg Oral Daily Child Campoy, MD      . aspirin chewable tablet 81 mg  81 mg Oral Daily Delfin Gant, NP   81 mg at 07/24/15 0820  . atenolol (TENORMIN) tablet 50 mg  50 mg Oral Daily Kathlee Nations, MD   50 mg at 07/24/15 0820  . atorvastatin (LIPITOR) tablet 40 mg  40 mg Oral QPM Delfin Gant, NP   40 mg at 07/23/15 1712  . baclofen (LIORESAL) tablet 10 mg  10 mg Oral TID Delfin Gant, NP   10 mg at 07/24/15 1208  . [START ON 07/25/2015] benztropine (COGENTIN) tablet 0.5 mg  0.5 mg Oral Daily Ardeth Repetto, MD      . benztropine (COGENTIN) tablet 0.5 mg  0.5 mg Oral QPM Tawan Corkern, MD      . calcium carbonate (OS-CAL - dosed in mg of elemental calcium) tablet 500 mg of elemental calcium  1 tablet Oral Q breakfast Delfin Gant, NP   500 mg of elemental calcium at 07/24/15 0820  . celecoxib (CELEBREX) capsule 100 mg  100 mg Oral BID Delfin Gant, NP   100 mg at 07/24/15 0820  . folic acid (FOLVITE) tablet 1 mg  1 mg Oral Daily Delfin Gant, NP   1 mg at 07/24/15 0820  . furosemide (LASIX) tablet 40 mg  40 mg Oral Daily Delfin Gant, NP    40 mg at 07/24/15 [id number]  . gabapentin (NEURONTIN) capsule 400 mg  400 mg Oral BID Delfin Gant, NP   400 mg at 07/24/15 0819  . hydrochlorothiazide (HYDRODIURIL) tablet 25 mg  25 mg Oral Daily Delfin Gant, NP   25 mg at 07/24/15 0819  . hydrOXYzine (ATARAX/VISTARIL) tablet 25 mg  25 mg Oral Q6H PRN Delfin Gant,  NP   25 mg at 07/19/15 2158  . insulin aspart (novoLOG) injection 0-9 Units  0-9 Units Subcutaneous TID WC Delfin Gant, NP   1 Units at 07/23/15 1716  . insulin glargine (LANTUS) injection 30 Units  30 Units Subcutaneous QHS Delfin Gant, NP   30 Units at 07/23/15 2216  . lamoTRIgine (LAMICTAL) tablet 25 mg  25 mg Oral BID Delfin Gant, NP   25 mg at 07/24/15 [id number]  . linagliptin (TRADJENTA) tablet 5 mg  5 mg Oral Daily Delfin Gant, NP   5 mg at 07/24/15 0820  . loratadine (CLARITIN) tablet 10 mg  10 mg Oral Daily Delfin Gant, NP   10 mg at 07/24/15 [id number]  . magnesium hydroxide (MILK OF MAGNESIA) suspension 30 mL  30 mL Oral Daily PRN Delfin Gant, NP      . metFORMIN (GLUCOPHAGE) tablet 1,000 mg  1,000 mg Oral BID WC Delfin Gant, NP   1,000 mg at 07/24/15 0819  . metoCLOPramide (REGLAN) tablet 10 mg  10 mg Oral TID AC & HS Delfin Gant, NP   10 mg at 07/24/15 1208  . multivitamin with minerals tablet 1 tablet  1 tablet Oral Daily Delfin Gant, NP   1 tablet at 07/24/15 [id number]  . naltrexone (DEPADE) tablet 50 mg  50 mg Oral Daily Delfin Gant, NP   50 mg at 07/24/15 0820  . nicotine (NICODERM CQ - dosed in mg/24 hours) patch 21 mg  21 mg Transdermal Daily Nicholaus Bloom, MD   21 mg at 07/24/15 0818  . Oxcarbazepine (TRILEPTAL) tablet 300 mg  300 mg Oral BID Kerrie Buffalo, NP   300 mg at 07/24/15 0819  . pantoprazole (PROTONIX) EC tablet 40 mg  40 mg Oral Daily Delfin Gant, NP   40 mg at 07/24/15 0820  . potassium chloride SA (K-DUR,KLOR-CON) CR tablet 20 mEq  20 mEq Oral Daily Delfin Gant, NP   20  mEq at 07/24/15 0820  . QUEtiapine (SEROQUEL XR) 24 hr tablet 400 mg  400 mg Oral Q2000 Kerrie Buffalo, NP   400 mg at 07/23/15 2146  . QUEtiapine (SEROQUEL) tablet 25 mg  25 mg Oral TID PRN [name], MD      . traZODone (DESYREL) tablet 175 mg  175 mg Oral QHS [name], MD   175 mg at 07/23/15 2212    Lab Results:  Results for orders placed or performed during the hospital encounter of 07/19/15 (from the past 48 hour(s))  Glucose, capillary     Status: Abnormal   Collection Time: 07/22/15  4:28 PM  Result Value Ref Range   Glucose-Capillary 127 (H) 65 - 99 mg/dL  Glucose, capillary     Status: Abnormal   Collection Time: 07/22/15  8:15 PM  Result Value Ref Range   Glucose-Capillary 119 (H) 65 - 99 mg/dL  Glucose, capillary     Status: None   Collection Time: 07/23/15  5:45 AM  Result Value Ref Range   Glucose-Capillary 87 65 - 99 mg/dL  Glucose, capillary     Status: Abnormal   Collection Time: 07/23/15 11:34 AM  Result Value Ref Range   Glucose-Capillary 140 (H) 65 - 99 mg/dL  Glucose, capillary     Status: Abnormal   Collection Time: 07/23/15  4:58 PM  Result Value Ref Range   Glucose-Capillary 129 (H) 65 - 99 mg/dL  Glucose, capillary     Status:  Abnormal   Collection Time: 07/23/15  8:53 PM  Result Value Ref Range   Glucose-Capillary 111 (H) 65 - 99 mg/dL  Glucose, capillary     Status: Abnormal   Collection Time: 07/24/15  6:31 AM  Result Value Ref Range   Glucose-Capillary 107 (H) 65 - 99 mg/dL    Physical Findings: AIMS: Facial and Oral Movements Muscles of Facial Expression: None, normal Lips and Perioral Area: None, normal Jaw: None, normal Tongue: None, normal,Extremity Movements Upper (arms, wrists, hands, fingers): None, normal Lower (legs, knees, ankles, toes): None, normal, Trunk Movements Neck, shoulders, hips: None, normal, Overall Severity Severity of abnormal movements (highest score from questions above): None, normal Incapacitation due  to abnormal movements: None, normal Patient's awareness of abnormal movements (rate only patient's report): No Awareness, Dental Status Current problems with teeth and/or dentures?: No Does patient usually wear dentures?: No  CIWA:    COWS:     Musculoskeletal: Strength & Muscle Tone: within normal limits Gait & Station: normal Patient leans: N/A  Psychiatric Specialty Exam: Review of Systems  Psychiatric/Behavioral: Positive for depression, suicidal ideas, hallucinations and substance abuse. The patient is nervous/anxious and has insomnia.   All other systems reviewed and are negative.   Blood pressure 102/69, pulse 81, temperature 98.7 F (37.1 C), temperature source Oral, resp. rate 18, height 5\' 4"  (1.626 m), weight 102.513 kg (226 lb), SpO2 100 %.Body mass index is 38.77 kg/(m^2).   General Appearance: Casual  Eye Contact:: Fair   Speech: Normal Rate  Volume: Decreased  Mood: Anxious and Depressed  Affect: Appropriate, Depressed   Thought Process: Coherent  Orientation: Full (Time, Place, and Person)  Thought Content: Rumination, paranoia, hallucination - has AH that is derogatory  Suicidal Thoughts:Suicidal thoughts , with intent to cut  Homicidal Thoughts:yes , without intent  Memory: Immediate; Fair Recent; Fair Remote; Fair  Judgement: Fair  Insight: Fair  Psychomotor Activity:Decreased   Concentration: fair  Recall: Good  Fund of Knowledge:Good  Language: Good  Akathisia: Negative  Handed: Right  AIMS (if indicated):    Assets: Communication Skills Resilience  ADL's: Intact  Cognition: WNL  Sleep: Number of Hours: 6.75       Treatment Plan Summary:[name] with hx of schizoaffective do , cocaine and alcohol abuse ,diabetes , who presented with worsening depressive sx and psychosis. Pt continues to remain withdrawn , psychotic. Will continue medication  management.   Reviewed past medical records,treatment plan. Will continue Seroquel XR 400 mg po qhs for psychosis/sleep. Will increase Abilify to 5 mg po qam and 10 mg po qpm for psychosis /as well as to augment the effect of seroquel. Will continue Trileptal 300 mg po bid for mood sx. Will continue Lamictal 25 mg po BID for mood lability. Will continue Trazodone  175 mg po qhs for sleep. Will make available PRN medications as per agitation protocol. Will continue Gabapentin 400 mg po bid for anxiety/pain. Will restart home medications where indicated.  Will continue to monitor vitals ,medication compliance and treatment side effects while patient is here.  Will monitor for medical issues as well as call consult as needed.  Reviewed labs ,prolactin elevated - Abilify will help with the same. Pt to follow up with outpt provider for monitoring . CSW will start working on disposition. Pt is motivated to go to a substance abuse program. Patient to participate in therapeutic milieu .       Maureen Bascomb, MD  07/24/2015, 3:01 PM

## 2015-07-25 LAB — GLUCOSE, CAPILLARY
GLUCOSE-CAPILLARY: 98 mg/dL (ref 65–99)
GLUCOSE-CAPILLARY: 132 mg/dL — AB (ref 65–99)
Glucose-Capillary: 136 mg/dL — ABNORMAL HIGH (ref 65–99)
Glucose-Capillary: 151 mg/dL — ABNORMAL HIGH (ref 65–99)

## 2015-07-25 MED ORDER — TUBERCULIN PPD 5 UNIT/0.1ML ID SOLN
5.0000 [IU] | Freq: Once | INTRADERMAL | Status: AC
  Administered 2015-07-25: 5 [IU] via INTRADERMAL

## 2015-07-25 MED ORDER — QUETIAPINE FUMARATE ER 200 MG PO TB24
200.0000 mg | ORAL_TABLET | Freq: Every day | ORAL | Status: DC
  Administered 2015-07-25: 200 mg via ORAL
  Filled 2015-07-25 (×3): qty 1

## 2015-07-25 MED ORDER — RAMELTEON 8 MG PO TABS
8.0000 mg | ORAL_TABLET | Freq: Every day | ORAL | Status: DC
  Administered 2015-07-25 – 2015-07-26 (×2): 8 mg via ORAL
  Filled 2015-07-25 (×3): qty 1

## 2015-07-25 NOTE — Progress Notes (Addendum)
[facility] MD Progress Note  07/25/2015 2:17 PM COLLINS LYTER  MRN:  [id number] Subjective:  Pt states " I still have sleep issues, I have SI on and off.'    Ojective:  [name] with hx of schizoaffective do , cocaine and alcohol abuse , is single , who was living with her boyfriend , who was admitted due to suicidal thoughts, hallucination and plan to stab herself in the stomach.    Patient seen chart reviewed. Pt discussed with treatment team.  Pt today seen as depressed,continues to have SI , passive , denies plan. Pt also with sleep issues, discussed changing her medications - agrees with plan. Pt continues to have AH - reports some improvement this AM. Per staff pt tolerating medications well, denies ADRS.     Principal Problem: Schizoaffective disorder, bipolar type ([place]) Diagnosis:   Patient Active Problem List   Diagnosis Date Noted  . Tobacco use disorder [F17.200] 07/20/2015  . Diabetes mellitus ([place]) [E11.9] 07/20/2015  . Suicidal ideation [R45.851]   . Cocaine use disorder, moderate, dependence ([place]) [F14.20] 04/03/2015  . Schizoaffective disorder, bipolar type ([place]) [F25.0] 04/03/2015  . Diabetes ([place]) [E11.9] 04/03/2015  . Alcohol use disorder, moderate, dependence ([place]) [F10.20] 04/03/2015  . Conversion disorder [F44.9] 12/09/2014  . Acute ischemic stroke ([place]) [I63.9] 12/08/2014  . Left-sided weakness [M62.89] 12/08/2014  . 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 Knee pain [M25.569] 10/17/2010  . HYPERGLYCEMIA [R73.09] 08/16/2010  . MAMMOGRAM, ABNORMAL, RIGHT [R92.8] 08/05/2010  . HYPERLIPIDEMIA [E78.5] 06/21/2010  . AMENORRHEA, SECONDARY [N91.2] 10/29/2009  . 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 HELICOBACTER PYLORI INFECTION [A04.8] 04/02/2009  . GERD [K21.9] 10/11/2007  . Essential hypertension [I10] 02/26/2007   Total Time spent with patient: 25 minutes  Past Psychiatric History: see above noted  Past  Medical History:  Past Medical History  Diagnosis Date  . High cholesterol   . Depression   . Gout   . Anxiety   . Bipolar 1 disorder ([place])   . GERD (gastroesophageal reflux disease)   . Substance abuse     crack cocaine  . Headache(784.0)     migraines  . TMJ (temporomandibular joint disorder)   . Asthma     daily and prn inhalers  . Arthritis     back and knees  . Gastric ulcer   . 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 Hypertension     under control, has been on med. x 12 yrs.  . Diabetes mellitus     diet-controlled  . CHF (congestive heart failure) [name])     Past Surgical History  Procedure Laterality Date  . Knee arthroscopy w/ partial medial meniscectomy  05/01/2010    right  . Breast lumpectomy with needle localization  04/19/2012    Procedure: BREAST LUMPECTOMY WITH NEEDLE LOCALIZATION;  Surgeon: [name], MD;  Location: [place];  Service: General;  Laterality: Right;   Family History:  Family History  Problem Relation Age of Onset  . Diabetes Mother   . Breast cancer Mother   . Cancer Father   . Bipolar disorder Maternal Aunt   . Schizophrenia Maternal Grandfather   . Alcoholism Maternal Uncle    Family Psychiatric  History:  Schizophrenia, grandfather Social History:  History  Alcohol Use  . 0.0 oz/week    Comment: reports she drinks 3 40 ounce beers daily     History  Drug  Use No    Comment: last use 1/30 (07/18/15)    Social History   Social History  . Marital Status: Single    Spouse Name: N/A  . Number of Children: N/A  . Years of Education: N/A   Social History Main Topics  . Smoking status: Current Every Day Smoker -- 0.50 packs/day for 15 years    Types: Cigarettes    Last Attempt to Quit: 08/02/2012  . Smokeless tobacco: Never Used  . Alcohol Use: 0.0 oz/week     Comment: reports she drinks 3 40 ounce beers daily  . Drug Use: No     Comment: last use 1/30 (07/18/15)  . Sexual Activity:  Not Asked   Other Topics Concern  . None   Social History Narrative   Additional Social History:     Sleep: Poor  Appetite:  Fair  Current Medications: Current Facility-Administered Medications  Medication Dose Route Frequency Provider Last Rate Last Dose  . acetaminophen (TYLENOL) tablet 650 mg  650 mg Oral Q6H PRN Delfin Gant, NP   650 mg at 07/25/15 1154  . alum & mag hydroxide-simeth (MAALOX/MYLANTA) 200-200-20 MG/5ML suspension 30 mL  30 mL Oral Q4H PRN Delfin Gant, NP      . amLODipine (NORVASC) tablet 5 mg  5 mg Oral Daily Delfin Gant, NP   5 mg at 07/25/15 0826  . ARIPiprazole (ABILIFY) tablet 10 mg  10 mg Oral QPM [name], MD   10 mg at 07/24/15 1718  . ARIPiprazole (ABILIFY) tablet 5 mg  5 mg Oral Daily [name], MD   5 mg at 07/25/15 0825  . aspirin chewable tablet 81 mg  81 mg Oral Daily Delfin Gant, NP   81 mg at 07/25/15 0826  . atenolol (TENORMIN) tablet 50 mg  50 mg Oral Daily Kathlee Nations, MD   50 mg at 07/25/15 0825  . atorvastatin (LIPITOR) tablet 40 mg  40 mg Oral QPM Delfin Gant, NP   40 mg at 07/24/15 1718  . baclofen (LIORESAL) tablet 10 mg  10 mg Oral TID Delfin Gant, NP   10 mg at 07/25/15 1149  . benztropine (COGENTIN) tablet 0.5 mg  0.5 mg Oral Daily [name], MD   0.5 mg at 07/25/15 0824  . benztropine (COGENTIN) tablet 0.5 mg  0.5 mg Oral QPM Naly Schwanz, MD   0.5 mg at 07/24/15 1718  . calcium carbonate (OS-CAL - dosed in mg of elemental calcium) tablet 500 mg of elemental calcium  1 tablet Oral Q breakfast Delfin Gant, NP   500 mg of elemental calcium at 07/25/15 0826  . celecoxib (CELEBREX) capsule 100 mg  100 mg Oral BID Delfin Gant, NP   100 mg at 07/25/15 0826  . folic acid (FOLVITE) tablet 1 mg  1 mg Oral Daily Delfin Gant, NP   1 mg at 07/25/15 0826  . furosemide (LASIX) tablet 40 mg  40 mg Oral Daily Delfin Gant, NP   40 mg at 07/25/15 0825  . gabapentin  (NEURONTIN) capsule 400 mg  400 mg Oral BID Delfin Gant, NP   400 mg at 07/25/15 0826  . hydrochlorothiazide (HYDRODIURIL) tablet 25 mg  25 mg Oral Daily Delfin Gant, NP   25 mg at 07/25/15 0825  . hydrOXYzine (ATARAX/VISTARIL) tablet 25 mg  25 mg Oral Q6H PRN Delfin Gant, NP   25 mg at 07/25/15 0831  . insulin  aspart (novoLOG) injection 0-9 Units  0-9 Units Subcutaneous TID WC Delfin Gant, NP   1 Units at 07/24/15 1720  . insulin glargine (LANTUS) injection 30 Units  30 Units Subcutaneous QHS Delfin Gant, NP   Stopped at 07/24/15 2200  . lamoTRIgine (LAMICTAL) tablet 25 mg  25 mg Oral BID Delfin Gant, NP   25 mg at 07/25/15 0826  . linagliptin (TRADJENTA) tablet 5 mg  5 mg Oral Daily Delfin Gant, NP   5 mg at 07/25/15 0825  . loratadine (CLARITIN) tablet 10 mg  10 mg Oral Daily Delfin Gant, NP   10 mg at 07/25/15 0824  . magnesium hydroxide (MILK OF MAGNESIA) suspension 30 mL  30 mL Oral Daily PRN Delfin Gant, NP      . metFORMIN (GLUCOPHAGE) tablet 1,000 mg  1,000 mg Oral BID WC Delfin Gant, NP   1,000 mg at 07/25/15 0824  . metoCLOPramide (REGLAN) tablet 10 mg  10 mg Oral TID AC & HS Delfin Gant, NP   10 mg at 07/25/15 1147  . multivitamin with minerals tablet 1 tablet  1 tablet Oral Daily Delfin Gant, NP   1 tablet at 07/25/15 0825  . naltrexone (DEPADE) tablet 50 mg  50 mg Oral Daily Delfin Gant, NP   50 mg at 07/25/15 0824  . nicotine (NICODERM CQ - dosed in mg/24 hours) patch 21 mg  21 mg Transdermal Daily Nicholaus Bloom, MD   21 mg at 07/25/15 0800  . Oxcarbazepine (TRILEPTAL) tablet 300 mg  300 mg Oral BID Kerrie Buffalo, NP   300 mg at 07/25/15 0825  . pantoprazole (PROTONIX) EC tablet 40 mg  40 mg Oral Daily Delfin Gant, NP   40 mg at 07/25/15 0825  . potassium chloride SA (K-DUR,KLOR-CON) CR tablet 20 mEq  20 mEq Oral Daily Delfin Gant, NP   20 mEq at 07/25/15 0825  . QUEtiapine  (SEROQUEL XR) 24 hr tablet 400 mg  400 mg Oral Q2000 Kerrie Buffalo, NP   400 mg at 07/24/15 2110  . QUEtiapine (SEROQUEL) tablet 25 mg  25 mg Oral TID PRN [name], MD   25 mg at 07/25/15 0831  . traZODone (DESYREL) tablet 175 mg  175 mg Oral QHS [name], MD   175 mg at 07/24/15 2111  . tuberculin injection 5 Units  5 Units Intradermal Once [name], MD        Lab Results:  Results for orders placed or performed during the hospital encounter of 07/19/15 (from the past 48 hour(s))  Glucose, capillary     Status: Abnormal   Collection Time: 07/23/15  4:58 PM  Result Value Ref Range   Glucose-Capillary 129 (H) 65 - 99 mg/dL  Glucose, capillary     Status: Abnormal   Collection Time: 07/23/15  8:53 PM  Result Value Ref Range   Glucose-Capillary 111 (H) 65 - 99 mg/dL  Glucose, capillary     Status: Abnormal   Collection Time: 07/24/15  6:31 AM  Result Value Ref Range   Glucose-Capillary 107 (H) 65 - 99 mg/dL  Glucose, capillary     Status: None   Collection Time: 07/24/15 11:58 AM  Result Value Ref Range   Glucose-Capillary 94 65 - 99 mg/dL   Comment 1 Notify RN    Comment 2 Document in Chart   Glucose, capillary     Status: Abnormal   Collection Time: 07/24/15  4:55 PM  Result Value Ref Range   Glucose-Capillary 142 (H) 65 - 99 mg/dL   Comment 1 Notify RN    Comment 2 Document in Chart   Glucose, capillary     Status: None   Collection Time: 07/24/15  8:41 PM  Result Value Ref Range   Glucose-Capillary 66 65 - 99 mg/dL  Glucose, capillary     Status: None   Collection Time: 07/24/15  9:06 PM  Result Value Ref Range   Glucose-Capillary 80 65 - 99 mg/dL  Glucose, capillary     Status: None   Collection Time: 07/25/15  6:10 AM  Result Value Ref Range   Glucose-Capillary 98 65 - 99 mg/dL  Glucose, capillary     Status: Abnormal   Collection Time: 07/25/15 11:42 AM  Result Value Ref Range   Glucose-Capillary 136 (H) 65 - 99 mg/dL    Physical  Findings: AIMS: Facial and Oral Movements Muscles of Facial Expression: None, normal Lips and Perioral Area: None, normal Jaw: None, normal Tongue: None, normal,Extremity Movements Upper (arms, wrists, hands, fingers): None, normal Lower (legs, knees, ankles, toes): None, normal, Trunk Movements Neck, shoulders, hips: None, normal, Overall Severity Severity of abnormal movements (highest score from questions above): None, normal Incapacitation due to abnormal movements: None, normal Patient's awareness of abnormal movements (rate only patient's report): No Awareness, Dental Status Current problems with teeth and/or dentures?: No Does patient usually wear dentures?: No  CIWA:    COWS:     Musculoskeletal: Strength & Muscle Tone: within normal limits Gait & Station: normal Patient leans: N/A  Psychiatric Specialty Exam: Review of Systems  Psychiatric/Behavioral: Positive for depression, suicidal ideas, hallucinations and substance abuse. The patient is nervous/anxious and has insomnia.   All other systems reviewed and are negative.   Blood pressure 109/73, pulse 92, temperature 98.9 F (37.2 C), temperature source Oral, resp. rate 20, height 5\' 4"  (1.626 m), weight 102.513 kg (226 lb), SpO2 100 %.Body mass index is 38.77 kg/(m^2).   General Appearance: Casual  Eye Contact:: Fair   Speech: Normal Rate  Volume: Decreased  Mood: Anxious and Depressed  Affect:  Depressed   Thought Process: Coherent  Orientation: Full (Time, Place, and Person)  Thought Content: Rumination, paranoia, hallucination - has AH that is derogatory  Suicidal Thoughts:Suicidal thoughts , without intent  Homicidal Thoughts:denies  Memory: Immediate; Fair Recent; Fair Remote; Fair  Judgement: Fair  Insight: Fair  Psychomotor Activity:Decreased   Concentration: fair  Recall: Good  Fund of Knowledge:Good  Language: Good  Akathisia: Negative  Handed: Right  AIMS  (if indicated):  0  Assets: Communication Skills Resilience  ADL's: Intact  Cognition: WNL  Sleep: Number of Hours: 6.75       Treatment Plan Summary:[name] with hx of schizoaffective do , cocaine and alcohol abuse ,diabetes , who presented with worsening depressive sx and psychosis. Pt continues to remain withdrawn , psychotic. Will continue medication management.   Reviewed past medical records,treatment plan. Will reduce Seroquel XR to 200 mg po qhs for psychosis/sleep. Will continue  Abilify to 5 mg po qam and 10 mg po qpm for psychosis /as well as to augment the effect of seroquel. Will add Rozerem 8 mg po qhs for insomnia. Will continue Trileptal 300 mg po bid for mood sx. Will continue Lamictal 25 mg po BID for mood lability. Will continue Trazodone  175 mg po qhs for sleep. Will make available PRN medications as per agitation protocol.  Will continue Gabapentin 400 mg po bid for anxiety/pain. Will restart home medications where indicated.  Will continue to monitor vitals ,medication compliance and treatment side effects while patient is here.  Will monitor for medical issues as well as call consult as needed.  Reviewed labs ,prolactin elevated - Abilify will help with the same. Pt to follow up with outpt provider for monitoring . CSW will start working on disposition. Pt is motivated to go to a substance abuse program. Will give PPD today. Patient to participate in therapeutic milieu .       Ivory Maduro, MD  07/25/2015, 2:17 PM

## 2015-07-25 NOTE — Progress Notes (Signed)
Adult Psychoeducational Group Note  Date:  07/25/2015 Time:  9:23 PM  Group Topic/Focus:  Wrap-Up Group:   The focus of this group is to help patients review their daily goal of treatment and discuss progress on daily workbooks.  Participation Level:  Active  Participation Quality:  Appropriate  Affect:  Appropriate  Cognitive:  Alert  Insight: Appropriate  Engagement in Group:  Engaged  Modes of Intervention:  Discussion  Additional Comments:  Patient goal for today was to get her blood sugar back down. On a scale from 1-10, (1=worst, 10=best) patient rated her day as a 7 because "I stayed up most of the day".  Ally Knodel L Jayleana Colberg 07/25/2015, 9:23 PM

## 2015-07-25 NOTE — Progress Notes (Signed)
D: Pt presents flat in affect and depressed in mood. Pt continues to endorse AH to harm herself. Pt denies any intentions to harm herself. Pt was visible and active within the milieu. Pt is compliant with her POC.  A: Writer administered scheduled medications to pt, per MD orders. Continued support and availability as needed was extended to this pt. Staff continues to monitor pt with q69min checks.  R: No adverse drug reactions noted. Pt receptive to treatment. Pt remains safe at this time.

## 2015-07-25 NOTE — Progress Notes (Signed)
Inpatient Diabetes Program Recommendations  AACE/ADA: New Consensus Statement on Inpatient Glycemic Control (2015)  Target Ranges:  Prepandial:   less than 140 mg/dL      Peak postprandial:   less than 180 mg/dL (1-2 hours)      Critically ill patients:  140 - 180 mg/dL   Review of Glycemic Control  Results for Maureen Swanson, Maureen Swanson (MRN [id number]) as of 07/25/2015 13:17  Ref. Range 07/24/2015 16:55 07/24/2015 20:41 07/24/2015 21:06 07/25/2015 06:10 07/25/2015 11:42  Glucose-Capillary Latest Ref Range: 65-99 mg/dL 142 (H) 66 80 98 136 (H)   Results for [name], [name] (MRN [id number]) as of 07/25/2015 13:17  Ref. Range 07/21/2015 06:30  Hemoglobin A1C Latest Ref Range: 4.8-5.6 % 5.7 (H)     Admit with: Schizoaffective disorder, S/I  History: DM, Cocaine/ETOH abuse  Home DM Meds: Lantus 30 units QHS  Januvia 50 mg daily  Metformin 1000 mg bid  Current Insulin Orders: Lantus 30 units QHS   Tradjenta 5 mg daily   Metformin 1000 mg bid  Novolog Sensitive SSI (0-9 units) TID AC     -CBGs well controlled so far on current DM regimen.  -No recommendations for medication adjustments at this time.    --Will follow patient during hospitalization--  Thank you. Lorenda Peck, RD, LDN, CDE Inpatient Diabetes Coordinator [phone]

## 2015-07-25 NOTE — BHH Group Notes (Signed)
[facility] LCSW Aftercare Discharge Planning Group Note   07/25/2015 2:21 PM  Participation Quality:    Mood/Affect:  Flat  Depression Rating:  8  Anxiety Rating:  8  Thoughts of Suicide:  Yes Will you contract for safety?   Yes  Current AVH:  Yes  Plan for Discharge/Comments:  Tried calling [facility] as instructed.  Was told to call back as interviewer was not available.  Interested in the possibility of staying in [facility] or ALF and states she would commit to staying for long term. Willing to sign any required paperwork.  States that she has been denied twice for disability-applied with help from [facility], and she just switched her case to [place] 2 weeks ago "because I needed a different plan."  Transportation Means:   Supports:  [place], [place]

## 2015-07-25 NOTE — Progress Notes (Signed)
Pt presents with depressed mood, affect irritable. Maureen Swanson reports that she continues to have auditory and visual hallucinations stating to writer this am that she sees '' little green men '' and that she continues to have auditory hallucinations '' negative things '' she requested prn medications for this stating '' i need a nerve pill '' she completed her self inventory and rates her depression , anxiety and hopelessness at same scale on 8/10 with 10 being worst and 0 being least. She also reported to continue to have thoughts of hurting herself, which was indicated on self inventory but agreed to contract for safety. She has not been noted to have any actions of self injurious behaviors throughout shift.A. Medications given as ordered. Including prn medications for described anxiety and agitation as above. Discussed above with treatment team and Dr. Shea Evans. R. Patient remains safe, and in no acute distress at this time. Will con't to monitor q 15 minutes for safety.

## 2015-07-25 NOTE — Plan of Care (Signed)
Problem: Diagnosis: Increased Risk For Suicide Attempt Goal: LTG-Patient Will Report Improvement in Psychotic Symptoms LTG (by discharge) : Patient will report improvement in psychotic symptoms.  Outcome: Not Progressing Maureen Swanson continues to report feeling depressed, having suicidal ideation and auditory hallucinations. She does contract for safety and has not had any self injurious behaviors or actions of self harm.
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
## 2015-07-25 NOTE — BHH Group Notes (Signed)
[facility] LCSW Group Therapy  07/25/2015 1:57 PM  Type of Therapy: Group Therapy  Participation Level: Invited. Pt said she was not feeling well. Did not attend.   Summary of Progress/Problems: [name] from the Center was here to tell his story of recovery and play his guitar.  Kara Mead. Marshell Levan 07/25/2015 1:57 PM

## 2015-07-26 LAB — GLUCOSE, CAPILLARY
GLUCOSE-CAPILLARY: 113 mg/dL — AB (ref 65–99)
Glucose-Capillary: 99 mg/dL (ref 65–99)
Glucose-Capillary: 144 mg/dL — ABNORMAL HIGH (ref 65–99)
Glucose-Capillary: 112 mg/dL — ABNORMAL HIGH (ref 65–99)

## 2015-07-26 MED ORDER — LAMOTRIGINE 25 MG PO TABS
50.0000 mg | ORAL_TABLET | Freq: Every evening | ORAL | Status: DC
  Administered 2015-07-26 – 2015-08-01 (×7): 50 mg via ORAL
  Filled 2015-07-26 (×3): qty 2
  Filled 2015-07-26: qty 14
  Filled 2015-07-26 (×5): qty 2

## 2015-07-26 MED ORDER — QUETIAPINE FUMARATE ER 50 MG PO TB24
100.0000 mg | ORAL_TABLET | Freq: Every day | ORAL | Status: DC
  Administered 2015-07-26 – 2015-08-01 (×7): 100 mg via ORAL
  Filled 2015-07-26: qty 14
  Filled 2015-07-26 (×3): qty 2
  Filled 2015-07-26: qty 1
  Filled 2015-07-26 (×6): qty 2

## 2015-07-26 MED ORDER — LAMOTRIGINE 25 MG PO TABS
25.0000 mg | ORAL_TABLET | Freq: Every day | ORAL | Status: DC
  Administered 2015-07-27 – 2015-08-02 (×7): 25 mg via ORAL
  Filled 2015-07-26 (×2): qty 1
  Filled 2015-07-26: qty 7
  Filled 2015-07-26 (×6): qty 1

## 2015-07-26 MED ORDER — GABAPENTIN 400 MG PO CAPS
400.0000 mg | ORAL_CAPSULE | ORAL | Status: DC
  Administered 2015-07-26 – 2015-08-02 (×14): 400 mg via ORAL
  Filled 2015-07-26 (×7): qty 1
  Filled 2015-07-26 (×2): qty 14
  Filled 2015-07-26 (×9): qty 1

## 2015-07-26 NOTE — Progress Notes (Signed)
D: Patient endorses passive SI but agrees to contract.   Patient rates hopelessness as 9 and depression as 9.  Patient affect is flat and her mood is depressed.  Pt states that she slept fair and her appetite is fair.  Pt states that her energy level is low and her ability to pay attention is poor. Patient visible on the milieu. No distress noted. A: Support and encouragement offered. Scheduled medications given to pt. Q 15 min checks continued for patient safety. R: Patient receptive. Patient remains safe on the unit.

## 2015-07-26 NOTE — Progress Notes (Signed)
Adult Psychoeducational Group Note  Date:  07/26/2015 Time:  8:39 PM  Group Topic/Focus:  Wrap-Up Group:   The focus of this group is to help patients review their daily goal of treatment and discuss progress on daily workbooks.  Participation Level:  Did Not Attend  Participation Quality:  Did not attend  Affect:  Did not attend  Cognitive:  Did not attend  Insight: None  Engagement in Group:  Did not attend  Modes of Intervention:  Did not attend   Additional Comments:  Patient did not attend wrap up group tonight.   Glinda Natzke L Daesia Zylka 07/26/2015, 8:39 PM

## 2015-07-26 NOTE — BHH Group Notes (Signed)
[facility] Group Notes:  (Counselor/Nursing/MHT/Case Management/Adjunct)  07/26/2015 1:15PM  Type of Therapy:  Group Therapy  Participation Level:  Active  Participation Quality:  Appropriate  Affect:  Flat  Cognitive:  Oriented  Insight:  Improving  Engagement in Group:  Limited  Engagement in Therapy:  Limited  Modes of Intervention:  Discussion, Exploration and Socialization  Summary of Progress/Problems: The topic for group was balance in life.  Pt participated in the discussion about when their life was in balance and out of balance and how this feels.  Pt discussed ways to get back in balance and short term goals they can work on to get where they want to be. "Balance means that you have safety and security.  I don't feel like I have that in my life now, but I am hopeful that it will come.  I guess I am just taking it one day at a time."  Went on to share that she is calling [facility] and [facility] everyday to find out about a bed.  Eyes closed for much of group.  Difficult to tell if she was listening or sleeping.   [name] 07/26/2015 1:27 PM

## 2015-07-26 NOTE — Progress Notes (Signed)
[facility] MD Progress Note  07/26/2015 12:52 PM Maureen Swanson  MRN:  [id number] Subjective:  Pt states " I hear voices telling me not to take medications. I slept a little better last night. I still feel like my mood is not where it needs to be.'     Ojective:  [name] with hx of schizoaffective do , cocaine and alcohol abuse , is single , who was living with her boyfriend , who was admitted due to suicidal thoughts, hallucination and plan to stab herself in the stomach.    Patient seen chart reviewed. Pt discussed with treatment team.  Pt today seen as less depressed, continues to have mood lability ,continues to have SI , passive , denies plan. Pt also with sleep issues, however had a better night sleep last night after Rozerem was added. Pt continues to have AH - which are derogatory - but improving. Pt continues to be motivated to go to a substance abuse program. Per staff pt tolerating medications well, denies ADRS.     Principal Problem: Schizoaffective disorder, bipolar type ([place]) Diagnosis:   Patient Active Problem List   Diagnosis Date Noted  . Tobacco use disorder [F17.200] 07/20/2015  . Diabetes mellitus ([place]) [E11.9] 07/20/2015  . Suicidal ideation [R45.851]   . Cocaine use disorder, moderate, dependence ([place]) [F14.20] 04/03/2015  . Schizoaffective disorder, bipolar type ([place]) [F25.0] 04/03/2015  . Diabetes ([place]) [E11.9] 04/03/2015  . Alcohol use disorder, moderate, dependence ([place]) [F10.20] 04/03/2015  . Conversion disorder [F44.9] 12/09/2014  . Acute ischemic stroke ([place]) [I63.9] 12/08/2014  . Left-sided weakness [M62.89] 12/08/2014  . 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 Knee pain [M25.569] 10/17/2010  . HYPERGLYCEMIA [R73.09] 08/16/2010  . MAMMOGRAM, ABNORMAL, RIGHT [R92.8] 08/05/2010  . HYPERLIPIDEMIA [E78.5] 06/21/2010  . AMENORRHEA, SECONDARY [N91.2] 10/29/2009  . 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  HELICOBACTER PYLORI INFECTION [A04.8] 04/02/2009  . GERD [K21.9] 10/11/2007  . Essential hypertension [I10] 02/26/2007   Total Time spent with patient: 25 minutes  Past Psychiatric History: see above noted  Past Medical History:  Past Medical History  Diagnosis Date  . High cholesterol   . Depression   . Gout   . Anxiety   . Bipolar 1 disorder ([place])   . GERD (gastroesophageal reflux disease)   . Substance abuse     crack cocaine  . Headache(784.0)     migraines  . TMJ (temporomandibular joint disorder)   . Asthma     daily and prn inhalers  . Arthritis     back and knees  . Gastric ulcer   . 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 Hypertension     under control, has been on med. x 12 yrs.  . Diabetes mellitus     diet-controlled  . CHF (congestive heart failure) [facility])     Past Surgical History  Procedure Laterality Date  . Knee arthroscopy w/ partial medial meniscectomy  05/01/2010    right  . Breast lumpectomy with needle localization  04/19/2012    Procedure: BREAST LUMPECTOMY WITH NEEDLE LOCALIZATION;  Surgeon: [name], MD;  Location: [place];  Service: General;  Laterality: Right;   Family History:  Family History  Problem Relation Age of Onset  . Diabetes Mother   . Breast cancer Mother   . Cancer Father   . Bipolar disorder Maternal Aunt   . Schizophrenia Maternal Grandfather   .  Alcoholism Maternal Uncle    Family Psychiatric  History:  Schizophrenia, grandfather Social History:  History  Alcohol Use  . 0.0 oz/week    Comment: reports she drinks 3 40 ounce beers daily     History  Drug Use No    Comment: last use 1/30 (07/18/15)    Social History   Social History  . Marital Status: Single    Spouse Name: N/A  . Number of Children: N/A  . Years of Education: N/A   Social History Main Topics  . Smoking status: Current Every Day Smoker -- 0.50 packs/day for 15 years    Types: Cigarettes    Last  Attempt to Quit: 08/02/2012  . Smokeless tobacco: Never Used  . Alcohol Use: 0.0 oz/week     Comment: reports she drinks 3 40 ounce beers daily  . Drug Use: No     Comment: last use 1/30 (07/18/15)  . Sexual Activity: Not Asked   Other Topics Concern  . None   Social History Narrative   Additional Social History:     Sleep: Fair  Appetite:  Fair  Current Medications: Current Facility-Administered Medications  Medication Dose Route Frequency Provider Last Rate Last Dose  . acetaminophen (TYLENOL) tablet 650 mg  650 mg Oral Q6H PRN Delfin Gant, NP   650 mg at 07/25/15 1154  . alum & mag hydroxide-simeth (MAALOX/MYLANTA) 200-200-20 MG/5ML suspension 30 mL  30 mL Oral Q4H PRN Delfin Gant, NP      . amLODipine (NORVASC) tablet 5 mg  5 mg Oral Daily Delfin Gant, NP   5 mg at 07/26/15 0837  . ARIPiprazole (ABILIFY) tablet 10 mg  10 mg Oral QPM Ianna Salmela, MD   10 mg at 07/25/15 1659  . ARIPiprazole (ABILIFY) tablet 5 mg  5 mg Oral Daily [name], MD   5 mg at 07/26/15 0836  . aspirin chewable tablet 81 mg  81 mg Oral Daily Delfin Gant, NP   81 mg at 07/26/15 0837  . atenolol (TENORMIN) tablet 50 mg  50 mg Oral Daily Kathlee Nations, MD   50 mg at 07/26/15 0837  . atorvastatin (LIPITOR) tablet 40 mg  40 mg Oral QPM Delfin Gant, NP   40 mg at 07/25/15 1658  . baclofen (LIORESAL) tablet 10 mg  10 mg Oral TID Delfin Gant, NP   10 mg at 07/26/15 1208  . benztropine (COGENTIN) tablet 0.5 mg  0.5 mg Oral Daily [name], MD   0.5 mg at 07/26/15 0837  . benztropine (COGENTIN) tablet 0.5 mg  0.5 mg Oral QPM Daeron Carreno, MD   0.5 mg at 07/25/15 1658  . calcium carbonate (OS-CAL - dosed in mg of elemental calcium) tablet 500 mg of elemental calcium  1 tablet Oral Q breakfast Delfin Gant, NP   500 mg of elemental calcium at 07/26/15 0837  . celecoxib (CELEBREX) capsule 100 mg  100 mg Oral BID Delfin Gant, NP   100 mg at 07/26/15  0837  . folic acid (FOLVITE) tablet 1 mg  1 mg Oral Daily Delfin Gant, NP   1 mg at 07/26/15 0837  . furosemide (LASIX) tablet 40 mg  40 mg Oral Daily Delfin Gant, NP   40 mg at 07/26/15 0836  . gabapentin (NEURONTIN) capsule 400 mg  400 mg Oral BH-qamhs Joselle Deeds, MD      . hydrochlorothiazide (HYDRODIURIL) tablet 25 mg  25 mg Oral  Daily Delfin Gant, NP   25 mg at 07/26/15 0837  . hydrOXYzine (ATARAX/VISTARIL) tablet 25 mg  25 mg Oral Q6H PRN Delfin Gant, NP   25 mg at 07/25/15 0831  . insulin aspart (novoLOG) injection 0-9 Units  0-9 Units Subcutaneous TID WC Delfin Gant, NP   2 Units at 07/25/15 1702  . insulin glargine (LANTUS) injection 30 Units  30 Units Subcutaneous QHS Delfin Gant, NP   Stopped at 07/24/15 2200  . [START ON 07/27/2015] lamoTRIgine (LAMICTAL) tablet 25 mg  25 mg Oral Daily Jovanni Rash, MD      . lamoTRIgine (LAMICTAL) tablet 50 mg  50 mg Oral QPM Makesha Belitz, MD      . linagliptin (TRADJENTA) tablet 5 mg  5 mg Oral Daily Delfin Gant, NP   5 mg at 07/26/15 0837  . loratadine (CLARITIN) tablet 10 mg  10 mg Oral Daily Delfin Gant, NP   10 mg at 07/26/15 0837  . magnesium hydroxide (MILK OF MAGNESIA) suspension 30 mL  30 mL Oral Daily PRN Delfin Gant, NP      . metFORMIN (GLUCOPHAGE) tablet 1,000 mg  1,000 mg Oral BID WC Delfin Gant, NP   1,000 mg at 07/26/15 0837  . metoCLOPramide (REGLAN) tablet 10 mg  10 mg Oral TID AC & HS Delfin Gant, NP   10 mg at 07/26/15 1208  . multivitamin with minerals tablet 1 tablet  1 tablet Oral Daily Delfin Gant, NP   1 tablet at 07/26/15 0837  . naltrexone (DEPADE) tablet 50 mg  50 mg Oral Daily Delfin Gant, NP   50 mg at 07/26/15 0837  . nicotine (NICODERM CQ - dosed in mg/24 hours) patch 21 mg  21 mg Transdermal Daily Nicholaus Bloom, MD   21 mg at 07/25/15 0800  . Oxcarbazepine (TRILEPTAL) tablet 300 mg  300 mg Oral BID Kerrie Buffalo, NP   300  mg at 07/26/15 0836  . pantoprazole (PROTONIX) EC tablet 40 mg  40 mg Oral Daily Delfin Gant, NP   40 mg at 07/26/15 0837  . potassium chloride SA (K-DUR,KLOR-CON) CR tablet 20 mEq  20 mEq Oral Daily Delfin Gant, NP   20 mEq at 07/26/15 0837  . QUEtiapine (SEROQUEL XR) 24 hr tablet 100 mg  100 mg Oral Q2000 Jaxiel Kines, MD      . QUEtiapine (SEROQUEL) tablet 25 mg  25 mg Oral TID PRN [name], MD   25 mg at 07/25/15 0831  . ramelteon (ROZEREM) tablet 8 mg  8 mg Oral QHS [name], MD   8 mg at 07/25/15 2130  . traZODone (DESYREL) tablet 175 mg  175 mg Oral QHS [name], MD   175 mg at 07/25/15 2131  . tuberculin injection 5 Units  5 Units Intradermal Once [name], MD   5 Units at 07/25/15 1516    Lab Results:  Results for orders placed or performed during the hospital encounter of 07/19/15 (from the past 48 hour(s))  Glucose, capillary     Status: Abnormal   Collection Time: 07/24/15  4:55 PM  Result Value Ref Range   Glucose-Capillary 142 (H) 65 - 99 mg/dL   Comment 1 Notify RN    Comment 2 Document in Chart   Glucose, capillary     Status: None   Collection Time: 07/24/15  8:41 PM  Result Value Ref Range   Glucose-Capillary 66 65 -  99 mg/dL  Glucose, capillary     Status: None   Collection Time: 07/24/15  9:06 PM  Result Value Ref Range   Glucose-Capillary 80 65 - 99 mg/dL  Glucose, capillary     Status: None   Collection Time: 07/25/15  6:10 AM  Result Value Ref Range   Glucose-Capillary 98 65 - 99 mg/dL  Glucose, capillary     Status: Abnormal   Collection Time: 07/25/15 11:42 AM  Result Value Ref Range   Glucose-Capillary 136 (H) 65 - 99 mg/dL  Glucose, capillary     Status: Abnormal   Collection Time: 07/25/15  4:49 PM  Result Value Ref Range   Glucose-Capillary 151 (H) 65 - 99 mg/dL   Comment 1 Notify RN    Comment 2 Document in Chart   Glucose, capillary     Status: Abnormal   Collection Time: 07/25/15  8:30 PM  Result Value  Ref Range   Glucose-Capillary 132 (H) 65 - 99 mg/dL  Glucose, capillary     Status: None   Collection Time: 07/26/15  6:27 AM  Result Value Ref Range   Glucose-Capillary 99 65 - 99 mg/dL  Glucose, capillary     Status: Abnormal   Collection Time: 07/26/15 11:50 AM  Result Value Ref Range   Glucose-Capillary 113 (H) 65 - 99 mg/dL   Comment 1 Notify RN    Comment 2 Document in Chart     Physical Findings: AIMS: Facial and Oral Movements Muscles of Facial Expression: None, normal Lips and Perioral Area: None, normal Jaw: None, normal Tongue: None, normal,Extremity Movements Upper (arms, wrists, hands, fingers): None, normal Lower (legs, knees, ankles, toes): None, normal, Trunk Movements Neck, shoulders, hips: None, normal, Overall Severity Severity of abnormal movements (highest score from questions above): None, normal Incapacitation due to abnormal movements: None, normal Patient's awareness of abnormal movements (rate only patient's report): No Awareness, Dental Status Current problems with teeth and/or dentures?: No Does patient usually wear dentures?: No  CIWA:    COWS:     Musculoskeletal: Strength & Muscle Tone: within normal limits Gait & Station: normal Patient leans: N/A  Psychiatric Specialty Exam: Review of Systems  Psychiatric/Behavioral: Positive for depression, suicidal ideas, hallucinations and substance abuse. The patient is nervous/anxious.   All other systems reviewed and are negative.   Blood pressure 110/70, pulse 87, temperature 98.8 F (37.1 C), temperature source Oral, resp. rate 20, height 5\' 4"  (1.626 m), weight 102.513 kg (226 lb), SpO2 100 %.Body mass index is 38.77 kg/(m^2).   General Appearance: Casual  Eye Contact:: Fair   Speech: Normal Rate  Volume: Decreased  Mood: Anxious and Depressed  Affect:  Depressed   Thought Process: Coherent  Orientation: Full (Time, Place, and Person)  Thought Content: Rumination, paranoia,  hallucination - has AH that is derogatory  Suicidal Thoughts:Suicidal thoughts , without intent  Homicidal Thoughts:denies  Memory: Immediate; Fair Recent; Fair Remote; Fair  Judgement: Fair  Insight: Fair  Psychomotor Activity:Decreased   Concentration: fair  Recall: Good  Fund of Knowledge:Good  Language: Good  Akathisia: Negative  Handed: Right  AIMS (if indicated):    Assets: Communication Skills Resilience  ADL's: Intact  Cognition: WNL  Sleep: Number of Hours: 5.75       Treatment Plan Summary:[name] with hx of schizoaffective do , cocaine and alcohol abuse ,diabetes , who presented with worsening depressive sx and psychosis. Pt with continued depressive sx, as well as AH, although progressing. Pt  continues to be motivated to attend substance abuse program. Will continue medication management.   Reviewed past medical records,treatment plan. Will reduce Seroquel XR to 100 mg po qhs for psychosis/sleep. Will continue  Abilify to 5 mg po qam and 10 mg po qpm for psychosis /as well as to augment the effect of seroquel. Will continue Rozerem 8 mg po qhs for insomnia. Will continue Trileptal 300 mg po bid for mood sx. Will increase Lamictal to 25 mg po daily and 50 mg po qpm  for mood lability. Will make available PRN medications as per agitation protocol. Will continue Gabapentin 400 mg po bid for anxiety/pain. Will restart home medications where indicated.  Will continue to monitor vitals ,medication compliance and treatment side effects while patient is here.  Will monitor for medical issues as well as call consult as needed.  Reviewed labs ,prolactin elevated - Abilify will help with the same. Pt to follow up with outpt provider for monitoring . CSW will start working on disposition. Pt is motivated to go to a substance abuse program. Will give PPD today. Patient to participate in therapeutic milieu  .       Laroy Mustard, MD  07/26/2015, 12:52 PM

## 2015-07-26 NOTE — NC FL2 (Signed)
[place] LEVEL OF CARE SCREENING TOOL     IDENTIFICATION  Patient Name: Maureen Swanson Birthdate: [birth year] Sex: female Admission Date (Current Location): 07/19/2015  [facility] and [place] Number:  Herbalist and Address:  The . [facility], [place] [address] [place], [place] [address]      Provider Number: [id number]  Attending Physician Name and Address:  [name], MD  Relative Name and Phone Number:       Current Level of Care: Hospital Recommended Level of Care: [place], [place] Prior Approval Number:    Date Approved/Denied:   PASRR Number:    Discharge Plan: Domiciliary (Rest home)    Current Diagnoses: Patient Active Problem List   Diagnosis Date Noted  . Tobacco use disorder 07/20/2015  . Diabetes mellitus ([place]) 07/20/2015  . Suicidal ideation   . Cocaine use disorder, moderate, dependence ([place]) 04/03/2015  . Schizoaffective disorder, bipolar type ([place]) 04/03/2015  . Diabetes ([place]) 04/03/2015  . Alcohol use disorder, moderate, dependence ([place]) 04/03/2015  . Conversion disorder 12/09/2014  . Acute ischemic stroke ([place]) 12/08/2014  . Left-sided weakness 12/08/2014  . 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  . Knee pain 10/17/2010  . HYPERGLYCEMIA 08/16/2010  . MAMMOGRAM, ABNORMAL, RIGHT 08/05/2010  . HYPERLIPIDEMIA 06/21/2010  . AMENORRHEA, SECONDARY 10/29/2009  . HERPES ZOSTER 08/20/2009  . HELICOBACTER PYLORI INFECTION 04/02/2009  . GERD 10/11/2007  . Essential hypertension 02/26/2007    Orientation RESPIRATION BLADDER Height & Weight     Self, Time, Situation, Place  Normal Continent Weight: 226 lb (102.513 kg) Height:  5\' 4"  (162.6 cm)  BEHAVIORAL SYMPTOMS/MOOD NEUROLOGICAL BOWEL NUTRITION STATUS      Continent Diet (Low Carb)  AMBULATORY STATUS COMMUNICATION OF NEEDS Skin   Independent Verbally Normal                       Personal Care Assistance Level of  Assistance  Bathing, Feeding, Dressing Bathing Assistance: Independent Feeding assistance: Independent Dressing Assistance: Independent     Functional Limitations Info  Sight, Hearing, Speech Sight Info: Adequate Hearing Info: Adequate Speech Info: Adequate    SPECIAL CARE FACTORS FREQUENCY                       Contractures      Additional Factors Info  Psychotropic     Psychotropic Info: see meds         Current Medications (07/26/2015):  This is the current hospital active medication list Current Facility-Administered Medications  Medication Dose Route Frequency Provider Last Rate Last Dose  . acetaminophen (TYLENOL) tablet 650 mg  650 mg Oral Q6H PRN Delfin Gant, NP   650 mg at 07/25/15 1154  . alum & mag hydroxide-simeth (MAALOX/MYLANTA) 200-200-20 MG/5ML suspension 30 mL  30 mL Oral Q4H PRN Delfin Gant, NP      . amLODipine (NORVASC) tablet 5 mg  5 mg Oral Daily Delfin Gant, NP   5 mg at 07/26/15 0837  . ARIPiprazole (ABILIFY) tablet 10 mg  10 mg Oral QPM Saramma Eappen, MD   10 mg at 07/25/15 1659  . ARIPiprazole (ABILIFY) tablet 5 mg  5 mg Oral Daily [name], MD   5 mg at 07/26/15 0836  . aspirin chewable tablet 81 mg  81 mg Oral Daily Delfin Gant, NP   81 mg at 07/26/15 0837  . atenolol (TENORMIN) tablet 50 mg  50 mg Oral Daily Kathlee Nations, MD   50 mg at 07/26/15 0837  . atorvastatin (LIPITOR) tablet 40 mg  40 mg Oral QPM Delfin Gant, NP   40 mg at 07/25/15 1658  . baclofen (LIORESAL) tablet 10 mg  10 mg Oral TID Delfin Gant, NP   10 mg at 07/26/15 1208  . benztropine (COGENTIN) tablet 0.5 mg  0.5 mg Oral Daily [name], MD   0.5 mg at 07/26/15 0837  . benztropine (COGENTIN) tablet 0.5 mg  0.5 mg Oral QPM Saramma Eappen, MD   0.5 mg at 07/25/15 1658  . calcium carbonate (OS-CAL - dosed in mg of elemental calcium) tablet 500 mg of elemental calcium  1 tablet Oral Q breakfast Delfin Gant, NP   500 mg  of elemental calcium at 07/26/15 0837  . celecoxib (CELEBREX) capsule 100 mg  100 mg Oral BID Delfin Gant, NP   100 mg at 07/26/15 0837  . folic acid (FOLVITE) tablet 1 mg  1 mg Oral Daily Delfin Gant, NP   1 mg at 07/26/15 0837  . furosemide (LASIX) tablet 40 mg  40 mg Oral Daily Delfin Gant, NP   40 mg at 07/26/15 0836  . gabapentin (NEURONTIN) capsule 400 mg  400 mg Oral BH-qamhs Saramma Eappen, MD      . hydrochlorothiazide (HYDRODIURIL) tablet 25 mg  25 mg Oral Daily Delfin Gant, NP   25 mg at 07/26/15 0837  . hydrOXYzine (ATARAX/VISTARIL) tablet 25 mg  25 mg Oral Q6H PRN Delfin Gant, NP   25 mg at 07/25/15 0831  . insulin aspart (novoLOG) injection 0-9 Units  0-9 Units Subcutaneous TID WC Delfin Gant, NP   2 Units at 07/25/15 1702  . insulin glargine (LANTUS) injection 30 Units  30 Units Subcutaneous QHS Delfin Gant, NP   Stopped at 07/24/15 2200  . [START ON 07/27/2015] lamoTRIgine (LAMICTAL) tablet 25 mg  25 mg Oral Daily Saramma Eappen, MD      . lamoTRIgine (LAMICTAL) tablet 50 mg  50 mg Oral QPM Saramma Eappen, MD      . linagliptin (TRADJENTA) tablet 5 mg  5 mg Oral Daily Delfin Gant, NP   5 mg at 07/26/15 0837  . loratadine (CLARITIN) tablet 10 mg  10 mg Oral Daily Delfin Gant, NP   10 mg at 07/26/15 0837  . magnesium hydroxide (MILK OF MAGNESIA) suspension 30 mL  30 mL Oral Daily PRN Delfin Gant, NP      . metFORMIN (GLUCOPHAGE) tablet 1,000 mg  1,000 mg Oral BID WC Delfin Gant, NP   1,000 mg at 07/26/15 0837  . metoCLOPramide (REGLAN) tablet 10 mg  10 mg Oral TID AC & HS Delfin Gant, NP   10 mg at 07/26/15 1208  . multivitamin with minerals tablet 1 tablet  1 tablet Oral Daily Delfin Gant, NP   1 tablet at 07/26/15 0837  . naltrexone (DEPADE) tablet 50 mg  50 mg Oral Daily Delfin Gant, NP   50 mg at 07/26/15 0837  . nicotine (NICODERM CQ - dosed in mg/24 hours) patch 21 mg  21 mg  Transdermal Daily Nicholaus Bloom, MD   21 mg at 07/25/15 0800  . Oxcarbazepine (TRILEPTAL) tablet 300 mg  300 mg Oral BID Kerrie Buffalo, NP   300 mg at 07/26/15 0836  . pantoprazole (PROTONIX) EC tablet 40 mg  40 mg Oral  Daily Delfin Gant, NP   40 mg at 07/26/15 0837  . potassium chloride SA (K-DUR,KLOR-CON) CR tablet 20 mEq  20 mEq Oral Daily Delfin Gant, NP   20 mEq at 07/26/15 0837  . QUEtiapine (SEROQUEL XR) 24 hr tablet 100 mg  100 mg Oral Q2000 Saramma Eappen, MD      . QUEtiapine (SEROQUEL) tablet 25 mg  25 mg Oral TID PRN [name], MD   25 mg at 07/25/15 0831  . ramelteon (ROZEREM) tablet 8 mg  8 mg Oral QHS [name], MD   8 mg at 07/25/15 2130  . traZODone (DESYREL) tablet 175 mg  175 mg Oral QHS [name], MD   175 mg at 07/25/15 2131  . tuberculin injection 5 Units  5 Units Intradermal Once [name], MD   5 Units at 07/25/15 1516     Discharge Medications: Please see discharge summary for a list of discharge medications.  Relevant Imaging Results:  Relevant Lab Results:   Additional Information    [name], [place]

## 2015-07-26 NOTE — BHH Group Notes (Signed)
[facility] Group Notes:  (Nursing/MHT/Case Management/Adjunct)  Date:  07/26/2015    Time:  0930 Type of Therapy:  Nurse Education  Participation Level:  Active  Participation Quality:  Appropriate and Attentive  Affect:  Appropriate  Cognitive:  Alert and Appropriate  Insight:  Appropriate and Good  Engagement in Group:  Engaged  Modes of Intervention:  Activity, Discussion, Education and Exploration  Summary of Progress/Problems: Topic was on leisure and lifestyle changes. Discussed the importance of choosing a healthy leisure activities. Group encouraged to surround themselves with positive and healthy group/support system when changing to a healthy lifestyle. Patient was receptive and contributed.    Mart Piggs 07/26/2015, 12:37 PM

## 2015-07-26 NOTE — Progress Notes (Addendum)
D: Pt been in bed resting with her eyes closed for majority of the evening. Pt presented with a blunted affect and depressed mood. Pt reported that her day was "okay". "I've been up most of the day". Per pt, she rarely heard any voices today. Pt denied any SI/HI/VH. Pt declined her lantus due to having a cbg of 112. Pt was encouraged to eat a snack. "I have two fruit cups I can eat".  A: Writer administered scheduled medications to pt, per MD orders. Continued support and availability as needed was extended to this pt. Staff continues to monitor pt with q49min checks.  R: No adverse drug reactions noted. Pt receptive to treatment. Pt remains safe at this time.

## 2015-07-27 LAB — GLUCOSE, CAPILLARY
GLUCOSE-CAPILLARY: 113 mg/dL — AB (ref 65–99)
GLUCOSE-CAPILLARY: 157 mg/dL — AB (ref 65–99)
GLUCOSE-CAPILLARY: 119 mg/dL — AB (ref 65–99)
GLUCOSE-CAPILLARY: 94 mg/dL (ref 65–99)
Glucose-Capillary: 94 mg/dL (ref 65–99)

## 2015-07-27 MED ORDER — MIRTAZAPINE 7.5 MG PO TABS
7.5000 mg | ORAL_TABLET | Freq: Every day | ORAL | Status: DC
  Administered 2015-07-27: 7.5 mg via ORAL
  Filled 2015-07-27 (×3): qty 1

## 2015-07-27 MED ORDER — GLUCERNA SHAKE PO LIQD
237.0000 mL | Freq: Three times a day (TID) | ORAL | Status: DC
  Administered 2015-07-27 – 2015-08-02 (×14): 237 mL via ORAL

## 2015-07-27 MED ORDER — PANTOPRAZOLE SODIUM 40 MG PO TBEC
40.0000 mg | DELAYED_RELEASE_TABLET | Freq: Two times a day (BID) | ORAL | Status: DC
  Administered 2015-07-27 – 2015-08-02 (×12): 40 mg via ORAL
  Filled 2015-07-27 (×17): qty 1

## 2015-07-27 MED ORDER — LORATADINE 10 MG PO TABS: 10.0000 mg | ORAL_TABLET | Freq: Every day | ORAL | Status: DC | PRN

## 2015-07-27 MED ORDER — ONDANSETRON HCL 4 MG PO TABS
ORAL_TABLET | ORAL | Status: AC
  Administered 2015-07-27: 11:00:00
  Filled 2015-07-27: qty 1

## 2015-07-27 MED ORDER — ONDANSETRON 4 MG PO TBDP: 4.0000 mg | ORAL_TABLET | Freq: Three times a day (TID) | ORAL | Status: DC | PRN

## 2015-07-27 NOTE — BHH Group Notes (Signed)
[facility] LCSW Aftercare Discharge Planning Group Note   07/27/2015 9:23 AM  Participation Quality:  Minimal  Mood/Affect:  Depressed  Depression Rating:    Anxiety Rating:    Thoughts of Suicide:  Yes Will you contract for safety?   Yes  Current AVH:  Yes  Plan for Discharge/Comments:  Somatically focused today.  Transportation Means:   Supports:  [name]

## 2015-07-27 NOTE — Progress Notes (Signed)
DAR NOTE: Patient presents with anxious affect and depressed mood.  Denies pain, auditory and visual hallucinations.  Rates depression at 9, hopelessness at 9, and anxiety at 9.  Maintained on routine safety checks.  Medications given as prescribed.  Support and encouragement offered as needed.  Attended group and participated.  States goal for today is "pain in ankles and feet."  Patient received Zofran 4 mg for complain of nausea and stomach discomfort with good effect.

## 2015-07-27 NOTE — Progress Notes (Signed)
Adult Psychoeducational Group Note  Date:  07/27/2015 Time:  9:07 PM  Group Topic/Focus:  Wrap-Up Group:   The focus of this group is to help patients review their daily goal of treatment and discuss progress on daily workbooks.  Participation Level:  Did Not Attend  Participation Quality:  Did not attend  Affect:  Did not attend  Cognitive:  Did not attend  Insight: None  Engagement in Group:  Did not attend  Modes of Intervention:  Did not attend  Additional Comments:  Patient did not attend wrap up group tonight.   Ercell Perlman L Daved Mcfann 07/27/2015, 9:07 PM

## 2015-07-27 NOTE — [facility] Group Notes (Signed)
[facility] LCSW Group Therapy  07/27/2015  1:05 PM  Type of Therapy:  Group therapy  Participation Level:  Active  Participation Quality:  Attentive  Affect:  Flat  Cognitive:  Oriented  Insight:  Limited  Engagement in Therapy:  Limited  Modes of Intervention:  Discussion, Socialization  Summary of Progress/Problems:  Chaplain was here to lead a group on themes of hope and courage.  "Hope is having some kind of goal.  I'd like to work with animals. I want to go back to school."  Initially unable to say anything that she is hopeful about while here, but eventually said she hopes to find another bed for when she leaves here.  Maureen Swanson B 07/27/2015 1:27 PM

## 2015-07-27 NOTE — Progress Notes (Signed)
[facility] MD Progress Note  07/27/2015 10:01 AM Maureen Swanson  MRN:  [id number] Subjective:  Pt states " I still hear voices , but they are getting better. I still feel anxious , I could not sleep at all last night. I also feel nauseous today.'       Ojective:  [name] with hx of schizoaffective do , cocaine and alcohol abuse , is single , who was living with her boyfriend , who was admitted due to suicidal thoughts, hallucination and plan to stab herself in the stomach.    Patient seen chart reviewed. Pt discussed with treatment team.  Pt today seen as anxious , depressed and reports sleep issues.Pt was started on Rozerem two days ago , which helped her a little bit on the first day , but did not work last night at all. Pt with restless sleep all night. Pt also with nausea - denies any other concerns. Reports she has a hx of GERD and does have nausea on and off. Unknown if this is due to any of her medications. Looked at her medication list together - made readjustments. Increased Protonix and started Zofran as needed. Will continue to observe on the unit.      Principal Problem: Schizoaffective disorder, bipolar type ([place]) Diagnosis:   Patient Active Problem List   Diagnosis Date Noted  . Tobacco use disorder [F17.200] 07/20/2015  . Diabetes mellitus ([place]) [E11.9] 07/20/2015  . Suicidal ideation [R45.851]   . Cocaine use disorder, moderate, dependence ([place]) [F14.20] 04/03/2015  . Schizoaffective disorder, bipolar type ([place]) [F25.0] 04/03/2015  . Diabetes ([place]) [E11.9] 04/03/2015  . Alcohol use disorder, moderate, dependence ([place]) [F10.20] 04/03/2015  . Conversion disorder [F44.9] 12/09/2014  . Acute ischemic stroke ([place]) [I63.9] 12/08/2014  . Left-sided weakness [M62.89] 12/08/2014  . 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 Knee pain [M25.569] 10/17/2010  . HYPERGLYCEMIA [R73.09] 08/16/2010  . MAMMOGRAM, ABNORMAL, RIGHT  [R92.8] 08/05/2010  . HYPERLIPIDEMIA [E78.5] 06/21/2010  . AMENORRHEA, SECONDARY [N91.2] 10/29/2009  . 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 HELICOBACTER PYLORI INFECTION [A04.8] 04/02/2009  . GERD [K21.9] 10/11/2007  . Essential hypertension [I10] 02/26/2007   Total Time spent with patient: 25 minutes  Past Psychiatric History: see above noted  Past Medical History:  Past Medical History  Diagnosis Date  . High cholesterol   . Depression   . Gout   . Anxiety   . Bipolar 1 disorder ([place])   . GERD (gastroesophageal reflux disease)   . Substance abuse     crack cocaine  . Headache(784.0)     migraines  . TMJ (temporomandibular joint disorder)   . Asthma     daily and prn inhalers  . Arthritis     back and knees  . Gastric ulcer   . 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 Hypertension     under control, has been on med. x 12 yrs.  . Diabetes mellitus     diet-controlled  . CHF (congestive heart failure) [facility])     Past Surgical History  Procedure Laterality Date  . Knee arthroscopy w/ partial medial meniscectomy  05/01/2010    right  . Breast lumpectomy with needle localization  04/19/2012    Procedure: BREAST LUMPECTOMY WITH NEEDLE LOCALIZATION;  Surgeon: [name], MD;  Location: [facility];  Service: General;  Laterality: Right;   Family History:  Family History  Problem Relation Age  of Onset  . Diabetes Mother   . Breast cancer Mother   . Cancer Father   . Bipolar disorder Maternal Aunt   . Schizophrenia Maternal Grandfather   . Alcoholism Maternal Uncle    Family Psychiatric  History:  Schizophrenia, grandfather Social History:  History  Alcohol Use  . 0.0 oz/week    Comment: reports she drinks 3 40 ounce beers daily     History  Drug Use No    Comment: last use 1/30 (07/18/15)    Social History   Social History  . Marital Status: Single    Spouse Name: N/A  . Number of Children: N/A  . Years of Education: N/A    Social History Main Topics  . Smoking status: Current Every Day Smoker -- 0.50 packs/day for 15 years    Types: Cigarettes    Last Attempt to Quit: 08/02/2012  . Smokeless tobacco: Never Used  . Alcohol Use: 0.0 oz/week     Comment: reports she drinks 3 40 ounce beers daily  . Drug Use: No     Comment: last use 1/30 (07/18/15)  . Sexual Activity: Not Asked   Other Topics Concern  . None   Social History Narrative   Additional Social History:     Sleep: Poor  Appetite:  Poor, nauseaous  Current Medications: Current Facility-Administered Medications  Medication Dose Route Frequency Provider Last Rate Last Dose  . acetaminophen (TYLENOL) tablet 650 mg  650 mg Oral Q6H PRN Delfin Gant, NP   650 mg at 07/25/15 1154  . alum & mag hydroxide-simeth (MAALOX/MYLANTA) 200-200-20 MG/5ML suspension 30 mL  30 mL Oral Q4H PRN Delfin Gant, NP      . amLODipine (NORVASC) tablet 5 mg  5 mg Oral Daily Delfin Gant, NP   5 mg at 07/27/15 0840  . ARIPiprazole (ABILIFY) tablet 10 mg  10 mg Oral QPM [name], MD   10 mg at 07/26/15 1824  . ARIPiprazole (ABILIFY) tablet 5 mg  5 mg Oral Daily [name], MD   5 mg at 07/27/15 0837  . aspirin chewable tablet 81 mg  81 mg Oral Daily Delfin Gant, NP   81 mg at 07/27/15 [id number]  . atenolol (TENORMIN) tablet 50 mg  50 mg Oral Daily Kathlee Nations, MD   50 mg at 07/27/15 0840  . atorvastatin (LIPITOR) tablet 40 mg  40 mg Oral QPM Delfin Gant, NP   40 mg at 07/26/15 1823  . baclofen (LIORESAL) tablet 10 mg  10 mg Oral TID Delfin Gant, NP   10 mg at 07/27/15 0839  . benztropine (COGENTIN) tablet 0.5 mg  0.5 mg Oral Daily [name], MD   0.5 mg at 07/27/15 0839  . benztropine (COGENTIN) tablet 0.5 mg  0.5 mg Oral QPM Cathy Crounse, MD   0.5 mg at 07/26/15 1823  . calcium carbonate (OS-CAL - dosed in mg of elemental calcium) tablet 500 mg of elemental calcium  1 tablet Oral Q breakfast Delfin Gant, NP    500 mg of elemental calcium at 07/27/15 0838  . celecoxib (CELEBREX) capsule 100 mg  100 mg Oral BID Delfin Gant, NP   100 mg at 07/27/15 0845  . folic acid (FOLVITE) tablet 1 mg  1 mg Oral Daily Delfin Gant, NP   1 mg at 07/27/15 0839  . furosemide (LASIX) tablet 40 mg  40 mg Oral Daily Delfin Gant, NP   40  mg at 07/27/15 0840  . gabapentin (NEURONTIN) capsule 400 mg  400 mg Oral BH-qamhs Murry Diaz, MD   400 mg at 07/27/15 0836  . hydrochlorothiazide (HYDRODIURIL) tablet 25 mg  25 mg Oral Daily Delfin Gant, NP   25 mg at 07/27/15 0839  . hydrOXYzine (ATARAX/VISTARIL) tablet 25 mg  25 mg Oral Q6H PRN Delfin Gant, NP   25 mg at 07/25/15 0831  . insulin aspart (novoLOG) injection 0-9 Units  0-9 Units Subcutaneous TID WC Delfin Gant, NP   1 Units at 07/26/15 1826  . insulin glargine (LANTUS) injection 30 Units  30 Units Subcutaneous QHS Delfin Gant, NP   Stopped at 07/24/15 2200  . lamoTRIgine (LAMICTAL) tablet 25 mg  25 mg Oral Daily [name], MD   25 mg at 07/27/15 [id number]  . lamoTRIgine (LAMICTAL) tablet 50 mg  50 mg Oral QPM [name], MD   50 mg at 07/26/15 1823  . linagliptin (TRADJENTA) tablet 5 mg  5 mg Oral Daily Delfin Gant, NP   5 mg at 07/27/15 [id number]  . loratadine (CLARITIN) tablet 10 mg  10 mg Oral Daily PRN [name], MD      . magnesium hydroxide (MILK OF MAGNESIA) suspension 30 mL  30 mL Oral Daily PRN Delfin Gant, NP      . metFORMIN (GLUCOPHAGE) tablet 1,000 mg  1,000 mg Oral BID WC Delfin Gant, NP   1,000 mg at 07/27/15 [id number]  . mirtazapine (REMERON) tablet 7.5 mg  7.5 mg Oral QHS Kule Gascoigne, MD      . multivitamin with minerals tablet 1 tablet  1 tablet Oral Daily Delfin Gant, NP   1 tablet at 07/27/15 0839  . naltrexone (DEPADE) tablet 50 mg  50 mg Oral Daily Delfin Gant, NP   50 mg at 07/27/15 [id number]  . nicotine (NICODERM CQ - dosed in mg/24 hours) patch 21 mg  21 mg Transdermal  Daily Nicholaus Bloom, MD   21 mg at 07/25/15 0800  . ondansetron (ZOFRAN) 4 MG tablet           . ondansetron (ZOFRAN-ODT) disintegrating tablet 4 mg  4 mg Oral Q8H PRN [name], MD      . Oxcarbazepine (TRILEPTAL) tablet 300 mg  300 mg Oral BID Kerrie Buffalo, NP   300 mg at 07/27/15 0837  . pantoprazole (PROTONIX) EC tablet 40 mg  40 mg Oral BID AC Anaisabel Pederson, MD      . potassium chloride SA (K-DUR,KLOR-CON) CR tablet 20 mEq  20 mEq Oral Daily Delfin Gant, NP   20 mEq at 07/27/15 0839  . QUEtiapine (SEROQUEL XR) 24 hr tablet 100 mg  100 mg Oral Q2000 [name], MD   100 mg at 07/26/15 2145  . QUEtiapine (SEROQUEL) tablet 25 mg  25 mg Oral TID PRN [name], MD   25 mg at 07/25/15 0831  . tuberculin injection 5 Units  5 Units Intradermal Once [name], MD   5 Units at 07/25/15 1516    Lab Results:  Results for orders placed or performed during the hospital encounter of 07/19/15 (from the past 48 hour(s))  Glucose, capillary     Status: Abnormal   Collection Time: 07/25/15 11:42 AM  Result Value Ref Range   Glucose-Capillary 136 (H) 65 - 99 mg/dL  Glucose, capillary     Status: Abnormal   Collection Time: 07/25/15  4:49 PM  Result Value  Ref Range   Glucose-Capillary 151 (H) 65 - 99 mg/dL   Comment 1 Notify RN    Comment 2 Document in Chart   Glucose, capillary     Status: Abnormal   Collection Time: 07/25/15  8:30 PM  Result Value Ref Range   Glucose-Capillary 132 (H) 65 - 99 mg/dL  Glucose, capillary     Status: None   Collection Time: 07/26/15  6:27 AM  Result Value Ref Range   Glucose-Capillary 99 65 - 99 mg/dL  Glucose, capillary     Status: Abnormal   Collection Time: 07/26/15 11:50 AM  Result Value Ref Range   Glucose-Capillary 113 (H) 65 - 99 mg/dL   Comment 1 Notify RN    Comment 2 Document in Chart   Glucose, capillary     Status: Abnormal   Collection Time: 07/26/15  4:18 PM  Result Value Ref Range   Glucose-Capillary 144 (H) 65 - 99  mg/dL   Comment 1 Notify RN    Comment 2 Document in Chart   Glucose, capillary     Status: Abnormal   Collection Time: 07/26/15  8:22 PM  Result Value Ref Range   Glucose-Capillary 112 (H) 65 - 99 mg/dL  Glucose, capillary     Status: Abnormal   Collection Time: 07/27/15  6:34 AM  Result Value Ref Range   Glucose-Capillary 113 (H) 65 - 99 mg/dL  Glucose, capillary     Status: Abnormal   Collection Time: 07/27/15  9:12 AM  Result Value Ref Range   Glucose-Capillary 157 (H) 65 - 99 mg/dL    Physical Findings: AIMS: Facial and Oral Movements Muscles of Facial Expression: None, normal Lips and Perioral Area: None, normal Jaw: None, normal Tongue: None, normal,Extremity Movements Upper (arms, wrists, hands, fingers): None, normal Lower (legs, knees, ankles, toes): None, normal, Trunk Movements Neck, shoulders, hips: None, normal, Overall Severity Severity of abnormal movements (highest score from questions above): None, normal Incapacitation due to abnormal movements: None, normal Patient's awareness of abnormal movements (rate only patient's report): No Awareness, Dental Status Current problems with teeth and/or dentures?: No Does patient usually wear dentures?: No  CIWA:    COWS:     Musculoskeletal: Strength & Muscle Tone: within normal limits Gait & Station: normal Patient leans: N/A  Psychiatric Specialty Exam: Review of Systems  Gastrointestinal: Positive for nausea.  Psychiatric/Behavioral: Positive for depression, hallucinations and substance abuse. The patient is nervous/anxious and has insomnia.   All other systems reviewed and are negative.   Blood pressure 117/90, pulse 113, temperature 98.4 F (36.9 C), temperature source Oral, resp. rate 18, height 5\' 4"  (1.626 m), weight 102.513 kg (226 lb), SpO2 100 %.Body mass index is 38.77 kg/(m^2).   General Appearance: Casual  Eye Contact:: Fair   Speech: Normal Rate  Volume: Decreased  Mood: Anxious and  Depressed  Affect:  Depressed   Thought Process: Coherent  Orientation: Full (Time, Place, and Person)  Thought Content: Rumination, paranoia, hallucination - has AH that is derogatory- improving  Suicidal Thoughts:denies  Homicidal Thoughts:denies  Memory: Immediate; Fair Recent; Fair Remote; Fair  Judgement: Fair  Insight: Fair  Psychomotor Activity:Decreased   Concentration: fair  Recall: Good  Fund of Knowledge:Good  Language: Good  Akathisia: Negative  Handed: Right  AIMS (if indicated):    Assets: Communication Skills Resilience  ADL's: Intact  Cognition: WNL  Sleep: Number of Hours: 18. 63       Treatment Plan Summary:[name] with  hx of schizoaffective do , cocaine and alcohol abuse ,diabetes , who presented with worsening depressive sx and psychosis. Pt with continued depressive sx, as well as AH, although progressing. Pt also with sleep issues .Pt continues to be motivated to attend substance abuse program. Will continue medication management.   Reviewed past medical records,treatment plan. Will continue Seroquel XR to 100 mg po qhs for psychosis/sleep.Plan to taper off if she is more stable. Will continue  Abilify to 5 mg po qam and 10 mg po qpm for psychosis /as well as to augment the effect of seroquel.Will give Abilify maintena IM 400 mg if she agrees. Will DC Rozerem for lack of efficacy. Start Remeron 7.5 mg [po qhs for sleep. Will continue Trileptal 300 mg po bid for mood sx. Increased Lamictal to 25 mg po daily and 50 mg po qpm  for mood lability. Will make available PRN medications as per agitation protocol. Will continue Gabapentin 400 mg po bid for anxiety/pain. DC Reglan , change to Zofran 4 mg po prn for nausea. Change Protonix to 40 mg po bid bc for GERD. Will restart home medications where indicated.  Will continue to monitor vitals ,medication compliance and treatment side  effects while patient is here.  Will monitor for medical issues as well as call consult as needed.  Reviewed labs ,prolactin elevated - Abilify will help with the same. Pt to follow up with outpt provider for monitoring . CSW will start working on disposition. Pt is motivated to go to a substance abuse program. PPD provided . Patient to participate in therapeutic milieu .       Travion Ke, MD  07/27/2015, 10:01 AM

## 2015-07-28 LAB — URINALYSIS W MICROSCOPIC (NOT AT ARMC)
BILIRUBIN URINE: NEGATIVE
Glucose, UA: NEGATIVE mg/dL
KETONES UR: NEGATIVE mg/dL
NITRITE: NEGATIVE
PH: 6 (ref 5.0–8.0)
Protein, ur: NEGATIVE mg/dL
SPECIFIC GRAVITY, URINE: 1.007 (ref 1.005–1.030)

## 2015-07-28 LAB — COMPREHENSIVE METABOLIC PANEL
ALBUMIN: 4.3 g/dL (ref 3.5–5.0)
ALK PHOS: 90 U/L (ref 38–126)
ALT: 11 U/L — ABNORMAL LOW (ref 14–54)
ANION GAP: 14 (ref 5–15)
AST: 19 U/L (ref 15–41)
BUN: 16 mg/dL (ref 6–20)
CALCIUM: 9.9 mg/dL (ref 8.9–10.3)
CO2: 29 mmol/L (ref 22–32)
Chloride: 95 mmol/L — ABNORMAL LOW (ref 101–111)
Creatinine, Ser: 0.88 mg/dL (ref 0.44–1.00)
GFR calc non Af Amer: >60 mL/min (ref >60)
GLUCOSE: 122 mg/dL — AB (ref 65–99)
POTASSIUM: 3.4 mmol/L — AB (ref 3.5–5.1)
SODIUM: 138 mmol/L (ref 135–145)
TOTAL PROTEIN: 8.3 g/dL — AB (ref 6.5–8.1)
Total Bilirubin: 0.5 mg/dL (ref 0.3–1.2)

## 2015-07-28 LAB — GLUCOSE, CAPILLARY
GLUCOSE-CAPILLARY: 161 mg/dL — AB (ref 65–99)
GLUCOSE-CAPILLARY: 111 mg/dL — AB (ref 65–99)
Glucose-Capillary: 100 mg/dL — ABNORMAL HIGH (ref 65–99)
Glucose-Capillary: 91 mg/dL (ref 65–99)

## 2015-07-28 MED ORDER — TEMAZEPAM 15 MG PO CAPS
15.0000 mg | ORAL_CAPSULE | Freq: Every day | ORAL | Status: DC
  Administered 2015-07-28 – 2015-08-01 (×5): 15 mg via ORAL
  Filled 2015-07-28 (×5): qty 1

## 2015-07-28 MED ORDER — FLUCONAZOLE 50 MG PO TABS
150.0000 mg | ORAL_TABLET | Freq: Once | ORAL | Status: AC
  Administered 2015-07-28: 150 mg via ORAL
  Filled 2015-07-28: qty 3

## 2015-07-28 NOTE — Progress Notes (Signed)
   D: When asked about her day pt stated, my suicide thoughts come and go".  States she's been "dealing with [facility]", but would rather go to a group home.  Pt has no questions or concerns.   A:  Support and encouragement was offered. 15 min checks continued for safety.  R: Pt remains safe.

## 2015-07-28 NOTE — [facility] Group Notes (Signed)
[facility] Group Notes:  (Nursing/MHT/Case Management/Adjunct)  Date:  07/28/2015  Time:  11:14 AM  Type of Therapy:  Nurse Education  Participation Level:  Active  Participation Quality:  Appropriate and Attentive  Affect:  Appropriate  Cognitive:  Alert and Appropriate  Insight:  Appropriate, Good and Improving  Engagement in Group:  Engaged, Improving and Supportive  Modes of Intervention:  Activity, Discussion, Education and Support  Summary of Progress/Problems: Topic was on healthy coping skills. Discussed the importance of surrounding oneself with a good support system. Support systems are there to motivate, encourage, and assist with learning new coping skills that leads to a healthy lifestyle. Patient was attentive and receptive.   Mart Piggs 07/28/2015, 11:14 AM

## 2015-07-28 NOTE — Plan of Care (Signed)
Problem: Diagnosis: Increased Risk For Suicide Attempt Goal: LTG-Patient Will Report Improved Mood and Deny Suicidal LTG (by discharge) Patient will report improved mood and deny suicidal ideation.  Outcome: Progressing Pt denies SI at this time

## 2015-07-28 NOTE — Progress Notes (Signed)
[facility] MD Progress Note  07/28/2015 1:45 PM Maureen Swanson  MRN:  [id number] Subjective:  Pt states " I did not sleep at all last night.'        Ojective:  [name] with hx of schizoaffective do , cocaine and alcohol abuse , is single , who was living with her boyfriend , who was admitted due to suicidal thoughts, hallucination and plan to stab herself in the stomach.    Patient seen chart reviewed. Pt discussed with treatment team.  Pt today seen as less anxious , continues to be depressed and reports sleep issues.Pt was started on Remeron yesterday , but she continues to report sleep as restless. Discussed Restoril - pt agrees with plan. Per staff - pt with urinary sx- burning and itching. Will get UA/Uclx. Will continue to observe on the unit.      Principal Problem: Schizoaffective disorder, bipolar type ([place]) Diagnosis:   Patient Active Problem List   Diagnosis Date Noted  . Tobacco use disorder [F17.200] 07/20/2015  . Diabetes mellitus ([place]) [E11.9] 07/20/2015  . Suicidal ideation [R45.851]   . Cocaine use disorder, moderate, dependence ([place]) [F14.20] 04/03/2015  . Schizoaffective disorder, bipolar type ([place]) [F25.0] 04/03/2015  . Diabetes ([place]) [E11.9] 04/03/2015  . Alcohol use disorder, moderate, dependence ([place]) [F10.20] 04/03/2015  . Conversion disorder [F44.9] 12/09/2014  . Acute ischemic stroke ([place]) [I63.9] 12/08/2014  . Left-sided weakness [M62.89] 12/08/2014  . 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 Knee pain [M25.569] 10/17/2010  . HYPERGLYCEMIA [R73.09] 08/16/2010  . MAMMOGRAM, ABNORMAL, RIGHT [R92.8] 08/05/2010  . HYPERLIPIDEMIA [E78.5] 06/21/2010  . AMENORRHEA, SECONDARY [N91.2] 10/29/2009  . 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 HELICOBACTER PYLORI INFECTION [A04.8] 04/02/2009  . GERD [K21.9] 10/11/2007  . Essential hypertension [I10] 02/26/2007   Total Time spent with patient: 25 minutes  Past  Psychiatric History: see above noted  Past Medical History:  Past Medical History  Diagnosis Date  . High cholesterol   . Depression   . Gout   . Anxiety   . Bipolar 1 disorder ([place])   . GERD (gastroesophageal reflux disease)   . Substance abuse     crack cocaine  . Headache(784.0)     migraines  . TMJ (temporomandibular joint disorder)   . Asthma     daily and prn inhalers  . Arthritis     back and knees  . Gastric ulcer   . 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 Hypertension     under control, has been on med. x 12 yrs.  . Diabetes mellitus     diet-controlled  . CHF (congestive heart failure) [facility])     Past Surgical History  Procedure Laterality Date  . Knee arthroscopy w/ partial medial meniscectomy  05/01/2010    right  . Breast lumpectomy with needle localization  04/19/2012    Procedure: BREAST LUMPECTOMY WITH NEEDLE LOCALIZATION;  Surgeon: [name], MD;  Location: [facility];  Service: General;  Laterality: Right;   Family History:  Family History  Problem Relation Age of Onset  . Diabetes Mother   . Breast cancer Mother   . Cancer Father   . Bipolar disorder Maternal Aunt   . Schizophrenia Maternal Grandfather   . Alcoholism Maternal Uncle    Family Psychiatric  History:  Schizophrenia, grandfather Social History:  History  Alcohol Use  . 0.0 oz/week    Comment: reports she  drinks 3 40 ounce beers daily     History  Drug Use No    Comment: last use 1/30 (07/18/15)    Social History   Social History  . Marital Status: Single    Spouse Name: N/A  . Number of Children: N/A  . Years of Education: N/A   Social History Main Topics  . Smoking status: Current Every Day Smoker -- 0.50 packs/day for 15 years    Types: Cigarettes    Last Attempt to Quit: 08/02/2012  . Smokeless tobacco: Never Used  . Alcohol Use: 0.0 oz/week     Comment: reports she drinks 3 40 ounce beers daily  . Drug Use: No     Comment:  last use 1/30 (07/18/15)  . Sexual Activity: Not Asked   Other Topics Concern  . None   Social History Narrative   Additional Social History:     Sleep: Poor  Appetite:  Fair Current Medications: Current Facility-Administered Medications  Medication Dose Route Frequency Provider Last Rate Last Dose  . acetaminophen (TYLENOL) tablet 650 mg  650 mg Oral Q6H PRN Delfin Gant, NP   650 mg at 07/25/15 1154  . alum & mag hydroxide-simeth (MAALOX/MYLANTA) 200-200-20 MG/5ML suspension 30 mL  30 mL Oral Q4H PRN Delfin Gant, NP      . amLODipine (NORVASC) tablet 5 mg  5 mg Oral Daily Delfin Gant, NP   5 mg at 07/28/15 0915  . ARIPiprazole (ABILIFY) tablet 10 mg  10 mg Oral QPM [name], MD   10 mg at 07/27/15 1753  . ARIPiprazole (ABILIFY) tablet 5 mg  5 mg Oral Daily [name], MD   5 mg at 07/28/15 0917  . aspirin chewable tablet 81 mg  81 mg Oral Daily Delfin Gant, NP   81 mg at 07/28/15 0916  . atenolol (TENORMIN) tablet 50 mg  50 mg Oral Daily Kathlee Nations, MD   50 mg at 07/28/15 0915  . atorvastatin (LIPITOR) tablet 40 mg  40 mg Oral QPM Delfin Gant, NP   40 mg at 07/27/15 1753  . baclofen (LIORESAL) tablet 10 mg  10 mg Oral TID Delfin Gant, NP   10 mg at 07/28/15 1300  . benztropine (COGENTIN) tablet 0.5 mg  0.5 mg Oral Daily [name], MD   0.5 mg at 07/28/15 0917  . benztropine (COGENTIN) tablet 0.5 mg  0.5 mg Oral QPM Hodge Stachnik, MD   0.5 mg at 07/27/15 1754  . calcium carbonate (OS-CAL - dosed in mg of elemental calcium) tablet 500 mg of elemental calcium  1 tablet Oral Q breakfast Delfin Gant, NP   500 mg of elemental calcium at 07/28/15 0915  . celecoxib (CELEBREX) capsule 100 mg  100 mg Oral BID Delfin Gant, NP   100 mg at 07/28/15 0915  . feeding supplement (GLUCERNA SHAKE) (GLUCERNA SHAKE) liquid 237 mL  237 mL Oral TID BM Tymarion Everard, MD   237 mL at 07/28/15 1336  . folic acid (FOLVITE) tablet 1 mg  1  mg Oral Daily Delfin Gant, NP   1 mg at 07/28/15 0916  . furosemide (LASIX) tablet 40 mg  40 mg Oral Daily Delfin Gant, NP   40 mg at 07/28/15 0916  . gabapentin (NEURONTIN) capsule 400 mg  400 mg Oral BH-qamhs Cairo Lingenfelter, MD   400 mg at 07/28/15 0914  . hydrochlorothiazide (HYDRODIURIL) tablet 25 mg  25 mg Oral Daily  Delfin Gant, NP   25 mg at 07/28/15 0915  . hydrOXYzine (ATARAX/VISTARIL) tablet 25 mg  25 mg Oral Q6H PRN Delfin Gant, NP   25 mg at 07/25/15 0831  . insulin aspart (novoLOG) injection 0-9 Units  0-9 Units Subcutaneous TID WC Delfin Gant, NP   0 Units at 07/27/15 1726  . insulin glargine (LANTUS) injection 30 Units  30 Units Subcutaneous QHS Delfin Gant, NP   30 Units at 07/27/15 2138  . lamoTRIgine (LAMICTAL) tablet 25 mg  25 mg Oral Daily Maryrose Colvin, MD   25 mg at 07/28/15 0800  . lamoTRIgine (LAMICTAL) tablet 50 mg  50 mg Oral QPM Gershom Brobeck, MD   50 mg at 07/27/15 1753  . linagliptin (TRADJENTA) tablet 5 mg  5 mg Oral Daily Delfin Gant, NP   5 mg at 07/28/15 0915  . loratadine (CLARITIN) tablet 10 mg  10 mg Oral Daily PRN [name], MD      . magnesium hydroxide (MILK OF MAGNESIA) suspension 30 mL  30 mL Oral Daily PRN Delfin Gant, NP      . metFORMIN (GLUCOPHAGE) tablet 1,000 mg  1,000 mg Oral BID WC Delfin Gant, NP   1,000 mg at 07/28/15 0914  . multivitamin with minerals tablet 1 tablet  1 tablet Oral Daily Delfin Gant, NP   1 tablet at 07/28/15 0917  . naltrexone (DEPADE) tablet 50 mg  50 mg Oral Daily Delfin Gant, NP   50 mg at 07/28/15 0917  . nicotine (NICODERM CQ - dosed in mg/24 hours) patch 21 mg  21 mg Transdermal Daily Nicholaus Bloom, MD   21 mg at 07/28/15 0913  . ondansetron (ZOFRAN-ODT) disintegrating tablet 4 mg  4 mg Oral Q8H PRN [name], MD      . Oxcarbazepine (TRILEPTAL) tablet 300 mg  300 mg Oral BID Kerrie Buffalo, NP   300 mg at 07/28/15 0915  . pantoprazole  (PROTONIX) EC tablet 40 mg  40 mg Oral BID AC [name], MD   40 mg at 07/28/15 3790  . potassium chloride SA (K-DUR,KLOR-CON) CR tablet 20 mEq  20 mEq Oral Daily Delfin Gant, NP   20 mEq at 07/28/15 0916  . QUEtiapine (SEROQUEL XR) 24 hr tablet 100 mg  100 mg Oral Q2000 [name], MD   100 mg at 07/27/15 2009  . QUEtiapine (SEROQUEL) tablet 25 mg  25 mg Oral TID PRN [name], MD   25 mg at 07/25/15 0831  . temazepam (RESTORIL) capsule 15 mg  15 mg Oral QHS [name], MD        Lab Results:  Results for orders placed or performed during the hospital encounter of 07/19/15 (from the past 48 hour(s))  Glucose, capillary     Status: Abnormal   Collection Time: 07/26/15  4:18 PM  Result Value Ref Range   Glucose-Capillary 144 (H) 65 - 99 mg/dL   Comment 1 Notify RN    Comment 2 Document in Chart   Glucose, capillary     Status: Abnormal   Collection Time: 07/26/15  8:22 PM  Result Value Ref Range   Glucose-Capillary 112 (H) 65 - 99 mg/dL  Glucose, capillary     Status: Abnormal   Collection Time: 07/27/15  6:34 AM  Result Value Ref Range   Glucose-Capillary 113 (H) 65 - 99 mg/dL  Glucose, capillary     Status: Abnormal   Collection Time:  07/27/15  9:12 AM  Result Value Ref Range   Glucose-Capillary 157 (H) 65 - 99 mg/dL  Glucose, capillary     Status: Abnormal   Collection Time: 07/27/15 11:38 AM  Result Value Ref Range   Glucose-Capillary 119 (H) 65 - 99 mg/dL  Glucose, capillary     Status: None   Collection Time: 07/27/15  5:22 PM  Result Value Ref Range   Glucose-Capillary 94 65 - 99 mg/dL   Comment 1 Notify RN   Glucose, capillary     Status: None   Collection Time: 07/27/15  8:38 PM  Result Value Ref Range   Glucose-Capillary 94 65 - 99 mg/dL  Glucose, capillary     Status: Abnormal   Collection Time: 07/28/15  5:41 AM  Result Value Ref Range   Glucose-Capillary 100 (H) 65 - 99 mg/dL  Comprehensive metabolic panel     Status: Abnormal    Collection Time: 07/28/15  6:29 AM  Result Value Ref Range   Sodium 138 135 - 145 mmol/L   Potassium 3.4 (L) 3.5 - 5.1 mmol/L   Chloride 95 (L) 101 - 111 mmol/L   CO2 29 22 - 32 mmol/L   Glucose, Bld 122 (H) 65 - 99 mg/dL   BUN 16 6 - 20 mg/dL   Creatinine, Ser 0.88 0.44 - 1.00 mg/dL   Calcium 9.9 8.9 - 10.3 mg/dL   Total Protein 8.3 (H) 6.5 - 8.1 g/dL   Albumin 4.3 3.5 - 5.0 g/dL   AST 19 15 - 41 U/L   ALT 11 (L) 14 - 54 U/L   Alkaline Phosphatase 90 38 - 126 U/L   Total Bilirubin 0.5 0.3 - 1.2 mg/dL   GFR calc non Af Amer >60 >60 mL/min   GFR calc Af Amer >60 >60 mL/min    Comment: (NOTE) The eGFR has been calculated using the CKD EPI equation. This calculation has not been validated in all clinical situations. eGFR's persistently <60 mL/min signify possible Chronic Kidney Disease.    Anion gap 14 5 - 15    Comment: Performed at [facility]  Glucose, capillary     Status: None   Collection Time: 07/28/15 12:15 PM  Result Value Ref Range   Glucose-Capillary 91 65 - 99 mg/dL   Comment 1 Notify RN    Comment 2 Document in Chart     Physical Findings: AIMS: Facial and Oral Movements Muscles of Facial Expression: None, normal Lips and Perioral Area: None, normal Jaw: None, normal Tongue: None, normal,Extremity Movements Upper (arms, wrists, hands, fingers): None, normal Lower (legs, knees, ankles, toes): None, normal, Trunk Movements Neck, shoulders, hips: None, normal, Overall Severity Severity of abnormal movements (highest score from questions above): None, normal Incapacitation due to abnormal movements: None, normal Patient's awareness of abnormal movements (rate only patient's report): No Awareness, Dental Status Current problems with teeth and/or dentures?: No Does patient usually wear dentures?: No  CIWA:    COWS:     Musculoskeletal: Strength & Muscle Tone: within normal limits Gait & Station: normal Patient leans: N/A  Psychiatric  Specialty Exam: Review of Systems  Psychiatric/Behavioral: Positive for depression, hallucinations and substance abuse. The patient is nervous/anxious and has insomnia.   All other systems reviewed and are negative.   Blood pressure 118/73, pulse 94, temperature 98.8 F (37.1 C), temperature source Oral, resp. rate 18, height '5\' 4"'  (1.626 m), weight 102.513 kg (226 lb), SpO2 100 %.Body mass index is 38.77 kg/(m^2).  General Appearance: Casual  Eye Contact:: Fair   Speech: Normal Rate  Volume: Decreased  Mood: Anxious and Depressed  Affect:  Depressed   Thought Process: Coherent  Orientation: Full (Time, Place, and Person)  Thought Content: Rumination, paranoia, hallucination - has AH that is derogatory- improving  Suicidal Thoughts:denies  Homicidal Thoughts:denies  Memory: Immediate; Fair Recent; Fair Remote; Fair  Judgement: Fair  Insight: Fair  Psychomotor Activity:Decreased   Concentration: fair  Recall: Good  Fund of Knowledge:Good  Language: Good  Akathisia: Negative  Handed: Right  AIMS (if indicated):    Assets: Communication Skills Resilience  ADL's: Intact  Cognition: WNL  Sleep: Number of Hours: 54. 50       Treatment Plan Summary:[name] with hx of schizoaffective do , cocaine and alcohol abuse ,diabetes , who presented with worsening depressive sx and psychosis. Pt with continued depressive sx, as well as AH, although progressing. Pt also with sleep issues .Pt continues to be motivated to attend substance abuse program. Will continue medication management.   Reviewed past medical records,treatment plan. Will continue Seroquel XR to 100 mg po qhs for psychosis/sleep.Plan to taper off if she is more stable. Will continue  Abilify to 5 mg po qam and 10 mg po qpm for psychosis /as well as to augment the effect of seroquel.Will give Abilify maintena IM 400 mg if she  agrees. Will DC Remeron for lack of efficacy. Start Restoril 15  mg po qhs for sleep. Will continue Trileptal 300 mg po bid for mood sx. Increased Lamictal to 25 mg po daily and 50 mg po qpm  for mood lability. Will make available PRN medications as per agitation protocol. Will continue Gabapentin 400 mg po bid for anxiety/pain. Continue  Zofran 4 mg po prn for nausea. Continue Protonix to 40 mg po bid bc for GERD. Will restart home medications where indicated.  Will continue to monitor vitals ,medication compliance and treatment side effects while patient is here.  Will monitor for medical issues as well as call consult as needed.  Reviewed labs ,prolactin elevated - Abilify will help with the same. Pt to follow up with outpt provider for monitoring . CSW will start working on disposition. Pt is motivated to go to a substance abuse program. PPD provided . Patient to participate in therapeutic milieu .       Dangela How, MD  07/28/2015, 1:45 PM

## 2015-07-28 NOTE — [facility] Group Notes (Signed)
[facility] Group Notes:  (Clinical Social Work)  07/28/2015  11:15-12:00PM  Summary of Progress/Problems:   Today's process group involved patients discussing their feelings related to being hospitalized, as well as how they can use their present feelings to create a plan for out how to stay out of the hospital in the future.  A variety of coping skills were discussed in more depth, including use of a pillbox and following up with a psychiatrist and therapist. The patient expressed her primary feeling about being hospitalized is that it was necessary, and now she is working on her medication regimen.  She talked about not sleeping last night, the changes in her medication.  She was very agreeable and talked about the need for medication, actually fell asleep a couple of times in group.  Type of Therapy:  Group Therapy - Process  Participation Level:  Active  Participation Quality:  Attentive, Drowsy and Sharing  Affect:  Blunted  Cognitive:  Appropriate  Insight:  Developing/Improving  Engagement in Therapy:  Engaged  Modes of Intervention:  Exploration, Discussion  Selmer Dominion, LCSW 07/28/2015, 12:44 PM

## 2015-07-28 NOTE — Progress Notes (Signed)
DAR NOTE: Patient presents with anxious affect and depressed mood.  Denies pain, auditory and visual hallucinations.  Rates depression at 8, hopelessness at 8, and anxiety at 9.  Maintained on routine safety checks.  Medications given as prescribed.  Support and encouragement offered as needed.  Attended group and participated.  States goal for today is "depression, feet, ankles they really hurt."  Patient observed socializing with peers in the dayroom.  Offered no complaint.

## 2015-07-28 NOTE — Progress Notes (Signed)
D: Pt denies SI/HI/AVH. Pt is pleasant and cooperative. Pt stated she was doing better. Pt concerned that the amount of Seroquel will be insufficient for her to get some sleep.   A: Pt was offered support and encouragement. Pt was given scheduled medications. Pt was encourage to attend groups. Q 15 minute checks were done for safety.    R:Pt attends groups and interacts well with peers and staff. Pt is taking medication. Pt has no complaints.Pt receptive to treatment and safety maintained on unit.

## 2015-07-29 LAB — GLUCOSE, CAPILLARY
GLUCOSE-CAPILLARY: 119 mg/dL — AB (ref 65–99)
GLUCOSE-CAPILLARY: 147 mg/dL — AB (ref 65–99)
Glucose-Capillary: 127 mg/dL — ABNORMAL HIGH (ref 65–99)

## 2015-07-29 MED ORDER — ARIPIPRAZOLE ER 400 MG IM SUSR
400.0000 mg | INTRAMUSCULAR | Status: DC
  Administered 2015-07-30: 400 mg via INTRAMUSCULAR

## 2015-07-29 NOTE — Progress Notes (Signed)
[facility] MD Progress Note  07/29/2015 11:24 AM Maureen Swanson  MRN:  [id number] Subjective:  Pt states " I slept better last night. I had some HI this AM.'        Ojective:  [name] with hx of schizoaffective do , cocaine and alcohol abuse , is single , who was living with her boyfriend , who was admitted due to suicidal thoughts, hallucination and plan to stab herself in the stomach.    Patient seen chart reviewed. Pt discussed with treatment team.  Pt today seen as less anxious , reports sleep as improved last night. Pt today reports HI , but is able to cope with her thoughts better. Pt with chronic pedal edema - discussed elevating her foot as well as will provide rest today.. Will continue to observe on the unit.      Principal Problem: Schizoaffective disorder, bipolar type ([place]) Diagnosis:   Patient Active Problem List   Diagnosis Date Noted  . Tobacco use disorder [F17.200] 07/20/2015  . Diabetes mellitus ([place]) [E11.9] 07/20/2015  . Suicidal ideation [R45.851]   . Cocaine use disorder, moderate, dependence ([place]) [F14.20] 04/03/2015  . Schizoaffective disorder, bipolar type ([place]) [F25.0] 04/03/2015  . Diabetes ([place]) [E11.9] 04/03/2015  . Alcohol use disorder, moderate, dependence ([place]) [F10.20] 04/03/2015  . Conversion disorder [F44.9] 12/09/2014  . Acute ischemic stroke ([place]) [I63.9] 12/08/2014  . Left-sided weakness [M62.89] 12/08/2014  . 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 Knee pain [M25.569] 10/17/2010  . HYPERGLYCEMIA [R73.09] 08/16/2010  . MAMMOGRAM, ABNORMAL, RIGHT [R92.8] 08/05/2010  . HYPERLIPIDEMIA [E78.5] 06/21/2010  . AMENORRHEA, SECONDARY [N91.2] 10/29/2009  . 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 HELICOBACTER PYLORI INFECTION [A04.8] 04/02/2009  . GERD [K21.9] 10/11/2007  . Essential hypertension [I10] 02/26/2007   Total Time spent with patient: 25 minutes  Past Psychiatric History: see above  noted  Past Medical History:  Past Medical History  Diagnosis Date  . High cholesterol   . Depression   . Gout   . Anxiety   . Bipolar 1 disorder ([place])   . GERD (gastroesophageal reflux disease)   . Substance abuse     crack cocaine  . Headache(784.0)     migraines  . TMJ (temporomandibular joint disorder)   . Asthma     daily and prn inhalers  . Arthritis     back and knees  . Gastric ulcer   . 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 Hypertension     under control, has been on med. x 12 yrs.  . Diabetes mellitus     diet-controlled  . CHF (congestive heart failure) [facility])     Past Surgical History  Procedure Laterality Date  . Knee arthroscopy w/ partial medial meniscectomy  05/01/2010    right  . Breast lumpectomy with needle localization  04/19/2012    Procedure: BREAST LUMPECTOMY WITH NEEDLE LOCALIZATION;  Surgeon: [name], MD;  Location: [facility];  Service: General;  Laterality: Right;   Family History:  Family History  Problem Relation Age of Onset  . Diabetes Mother   . Breast cancer Mother   . Cancer Father   . Bipolar disorder Maternal Aunt   . Schizophrenia Maternal Grandfather   . Alcoholism Maternal Uncle    Family Psychiatric  History:  Schizophrenia, grandfather Social History:  History  Alcohol Use  . 0.0 oz/week    Comment: reports she drinks 3  40 ounce beers daily     History  Drug Use No    Comment: last use 1/30 (07/18/15)    Social History   Social History  . Marital Status: Single    Spouse Name: N/A  . Number of Children: N/A  . Years of Education: N/A   Social History Main Topics  . Smoking status: Current Every Day Smoker -- 0.50 packs/day for 15 years    Types: Cigarettes    Last Attempt to Quit: 08/02/2012  . Smokeless tobacco: Never Used  . Alcohol Use: 0.0 oz/week     Comment: reports she drinks 3 40 ounce beers daily  . Drug Use: No     Comment: last use 1/30 (07/18/15)  .  Sexual Activity: Not Asked   Other Topics Concern  . None   Social History Narrative   Additional Social History:     Sleep: Fair  Appetite:  Fair Current Medications: Current Facility-Administered Medications  Medication Dose Route Frequency Provider Last Rate Last Dose  . acetaminophen (TYLENOL) tablet 650 mg  650 mg Oral Q6H PRN Delfin Gant, NP   650 mg at 07/25/15 1154  . alum & mag hydroxide-simeth (MAALOX/MYLANTA) 200-200-20 MG/5ML suspension 30 mL  30 mL Oral Q4H PRN Delfin Gant, NP      . amLODipine (NORVASC) tablet 5 mg  5 mg Oral Daily Delfin Gant, NP   5 mg at 07/29/15 0935  . ARIPiprazole (ABILIFY) tablet 10 mg  10 mg Oral QPM [name], MD   10 mg at 07/28/15 1810  . ARIPiprazole (ABILIFY) tablet 5 mg  5 mg Oral Daily [name], MD   5 mg at 07/29/15 0934  . aspirin chewable tablet 81 mg  81 mg Oral Daily Delfin Gant, NP   81 mg at 07/29/15 0931  . atenolol (TENORMIN) tablet 50 mg  50 mg Oral Daily Kathlee Nations, MD   50 mg at 07/29/15 0935  . atorvastatin (LIPITOR) tablet 40 mg  40 mg Oral QPM Delfin Gant, NP   40 mg at 07/28/15 1810  . baclofen (LIORESAL) tablet 10 mg  10 mg Oral TID Delfin Gant, NP   10 mg at 07/29/15 0934  . benztropine (COGENTIN) tablet 0.5 mg  0.5 mg Oral Daily [name], MD   0.5 mg at 07/29/15 0933  . benztropine (COGENTIN) tablet 0.5 mg  0.5 mg Oral QPM Anvitha Hutmacher, MD   0.5 mg at 07/28/15 1810  . calcium carbonate (OS-CAL - dosed in mg of elemental calcium) tablet 500 mg of elemental calcium  1 tablet Oral Q breakfast Delfin Gant, NP   500 mg of elemental calcium at 07/29/15 0930  . celecoxib (CELEBREX) capsule 100 mg  100 mg Oral BID Delfin Gant, NP   100 mg at 07/29/15 0932  . feeding supplement (GLUCERNA SHAKE) (GLUCERNA SHAKE) liquid 237 mL  237 mL Oral TID BM Tricia Oaxaca, MD   237 mL at 07/29/15 0935  . folic acid (FOLVITE) tablet 1 mg  1 mg Oral Daily Delfin Gant, NP   1 mg at 07/29/15 0934  . furosemide (LASIX) tablet 40 mg  40 mg Oral Daily Delfin Gant, NP   40 mg at 07/29/15 0934  . gabapentin (NEURONTIN) capsule 400 mg  400 mg Oral BH-qamhs Otto Caraway, MD   400 mg at 07/29/15 0931  . hydrochlorothiazide (HYDRODIURIL) tablet 25 mg  25 mg Oral Daily Josephine C  Onuoha, NP   25 mg at 07/29/15 0932  . hydrOXYzine (ATARAX/VISTARIL) tablet 25 mg  25 mg Oral Q6H PRN Delfin Gant, NP   25 mg at 07/28/15 2127  . insulin aspart (novoLOG) injection 0-9 Units  0-9 Units Subcutaneous TID WC Delfin Gant, NP   2 Units at 07/28/15 1653  . insulin glargine (LANTUS) injection 30 Units  30 Units Subcutaneous QHS Delfin Gant, NP   30 Units at 07/28/15 2233  . lamoTRIgine (LAMICTAL) tablet 25 mg  25 mg Oral Daily [name], MD   25 mg at 07/29/15 0933  . lamoTRIgine (LAMICTAL) tablet 50 mg  50 mg Oral QPM Kinze Labo, MD   50 mg at 07/28/15 1810  . linagliptin (TRADJENTA) tablet 5 mg  5 mg Oral Daily Delfin Gant, NP   5 mg at 07/29/15 0935  . loratadine (CLARITIN) tablet 10 mg  10 mg Oral Daily PRN [name], MD      . magnesium hydroxide (MILK OF MAGNESIA) suspension 30 mL  30 mL Oral Daily PRN Delfin Gant, NP      . metFORMIN (GLUCOPHAGE) tablet 1,000 mg  1,000 mg Oral BID WC Delfin Gant, NP   1,000 mg at 07/29/15 0932  . multivitamin with minerals tablet 1 tablet  1 tablet Oral Daily Delfin Gant, NP   1 tablet at 07/29/15 0934  . naltrexone (DEPADE) tablet 50 mg  50 mg Oral Daily Delfin Gant, NP   50 mg at 07/29/15 0931  . nicotine (NICODERM CQ - dosed in mg/24 hours) patch 21 mg  21 mg Transdermal Daily Nicholaus Bloom, MD   21 mg at 07/29/15 0933  . ondansetron (ZOFRAN-ODT) disintegrating tablet 4 mg  4 mg Oral Q8H PRN [name], MD      . Oxcarbazepine (TRILEPTAL) tablet 300 mg  300 mg Oral BID Kerrie Buffalo, NP   300 mg at 07/29/15 0931  . pantoprazole (PROTONIX) EC tablet 40 mg   40 mg Oral BID AC [name], MD   40 mg at 07/29/15 0616  . potassium chloride SA (K-DUR,KLOR-CON) CR tablet 20 mEq  20 mEq Oral Daily Delfin Gant, NP   20 mEq at 07/29/15 0932  . QUEtiapine (SEROQUEL XR) 24 hr tablet 100 mg  100 mg Oral Q2000 [name], MD   100 mg at 07/28/15 2127  . QUEtiapine (SEROQUEL) tablet 25 mg  25 mg Oral TID PRN [name], MD   25 mg at 07/28/15 2239  . temazepam (RESTORIL) capsule 15 mg  15 mg Oral QHS [name], MD   15 mg at 07/28/15 2127    Lab Results:  Results for orders placed or performed during the hospital encounter of 07/19/15 (from the past 48 hour(s))  Glucose, capillary     Status: Abnormal   Collection Time: 07/27/15 11:38 AM  Result Value Ref Range   Glucose-Capillary 119 (H) 65 - 99 mg/dL  Glucose, capillary     Status: None   Collection Time: 07/27/15  5:22 PM  Result Value Ref Range   Glucose-Capillary 94 65 - 99 mg/dL   Comment 1 Notify RN   Glucose, capillary     Status: None   Collection Time: 07/27/15  8:38 PM  Result Value Ref Range   Glucose-Capillary 94 65 - 99 mg/dL  Glucose, capillary     Status: Abnormal   Collection Time: 07/28/15  5:41 AM  Result Value Ref Range  Glucose-Capillary 100 (H) 65 - 99 mg/dL  Comprehensive metabolic panel     Status: Abnormal   Collection Time: 07/28/15  6:29 AM  Result Value Ref Range   Sodium 138 135 - 145 mmol/L   Potassium 3.4 (L) 3.5 - 5.1 mmol/L   Chloride 95 (L) 101 - 111 mmol/L   CO2 29 22 - 32 mmol/L   Glucose, Bld 122 (H) 65 - 99 mg/dL   BUN 16 6 - 20 mg/dL   Creatinine, Ser 0.88 0.44 - 1.00 mg/dL   Calcium 9.9 8.9 - 10.3 mg/dL   Total Protein 8.3 (H) 6.5 - 8.1 g/dL   Albumin 4.3 3.5 - 5.0 g/dL   AST 19 15 - 41 U/L   ALT 11 (L) 14 - 54 U/L   Alkaline Phosphatase 90 38 - 126 U/L   Total Bilirubin 0.5 0.3 - 1.2 mg/dL   GFR calc non Af Amer >60 >60 mL/min   GFR calc Af Amer >60 >60 mL/min    Comment: (NOTE) The eGFR has been calculated using the CKD  EPI equation. This calculation has not been validated in all clinical situations. eGFR's persistently <60 mL/min signify possible Chronic Kidney Disease.    Anion gap 14 5 - 15    Comment: Performed at [facility]  Urinalysis with microscopic (not at [facility])     Status: Abnormal   Collection Time: 07/28/15 10:55 AM  Result Value Ref Range   Color, Urine YELLOW YELLOW   APPearance CLOUDY (A) CLEAR   Specific Gravity, Urine 1.007 1.005 - 1.030   pH 6.0 5.0 - 8.0   Glucose, UA NEGATIVE NEGATIVE mg/dL   Hgb urine dipstick TRACE (A) NEGATIVE   Bilirubin Urine NEGATIVE NEGATIVE   Ketones, ur NEGATIVE NEGATIVE mg/dL   Protein, ur NEGATIVE NEGATIVE mg/dL   Nitrite NEGATIVE NEGATIVE   Leukocytes, UA LARGE (A) NEGATIVE   WBC, UA 6-30 0 - 5 WBC/hpf   RBC / HPF 0-5 0 - 5 RBC/hpf   Bacteria, UA FEW (A) NONE SEEN   Squamous Epithelial / LPF 6-30 (A) NONE SEEN    Comment: Performed at [facility]  Glucose, capillary     Status: None   Collection Time: 07/28/15 12:15 PM  Result Value Ref Range   Glucose-Capillary 91 65 - 99 mg/dL   Comment 1 Notify RN    Comment 2 Document in Chart   Glucose, capillary     Status: Abnormal   Collection Time: 07/28/15  4:36 PM  Result Value Ref Range   Glucose-Capillary 161 (H) 65 - 99 mg/dL   Comment 1 Notify RN    Comment 2 Document in Chart   Glucose, capillary     Status: Abnormal   Collection Time: 07/28/15  8:32 PM  Result Value Ref Range   Glucose-Capillary 111 (H) 65 - 99 mg/dL   Comment 1 Notify RN   Glucose, capillary     Status: Abnormal   Collection Time: 07/29/15  6:14 AM  Result Value Ref Range   Glucose-Capillary 127 (H) 65 - 99 mg/dL   Comment 1 Notify RN     Physical Findings: AIMS: Facial and Oral Movements Muscles of Facial Expression: None, normal Lips and Perioral Area: None, normal Jaw: None, normal Tongue: None, normal,Extremity Movements Upper (arms, wrists, hands, fingers): None,  normal Lower (legs, knees, ankles, toes): None, normal, Trunk Movements Neck, shoulders, hips: None, normal, Overall Severity Severity of abnormal movements (highest score from questions above):  None, normal Incapacitation due to abnormal movements: None, normal Patient's awareness of abnormal movements (rate only patient's report): No Awareness, Dental Status Current problems with teeth and/or dentures?: No Does patient usually wear dentures?: No  CIWA:    COWS:     Musculoskeletal: Strength & Muscle Tone: within normal limits Gait & Station: normal Patient leans: N/A  Psychiatric Specialty Exam: Review of Systems  Cardiovascular: Positive for leg swelling (CHRONIC).  Psychiatric/Behavioral: Positive for depression, hallucinations and substance abuse. The patient is nervous/anxious.   All other systems reviewed and are negative.   Blood pressure 107/70, pulse 98, temperature 98.6 F (37 C), temperature source Oral, resp. rate 18, height '5\' 4"'  (1.626 m), weight 102.513 kg (226 lb), SpO2 100 %.Body mass index is 38.77 kg/(m^2).   General Appearance: Casual  Eye Contact:: Fair   Speech: Normal Rate  Volume: Decreased  Mood: Anxious and Depressed- improving  Affect:  Depressed   Thought Process: Coherent  Orientation: Full (Time, Place, and Person)  Thought Content: Rumination, paranoia, hallucination - has AH that is derogatory- improving  Suicidal Thoughts:denies  Homicidal Thoughts:yes - on and off , with out intent  Memory: Immediate; Fair Recent; Fair Remote; Fair  Judgement: Fair  Insight: Fair  Psychomotor Activity:Decreased   Concentration: fair  Recall: Good  Fund of Knowledge:Good  Language: Good  Akathisia: Negative  Handed: Right  AIMS (if indicated):    Assets: Communication Skills Resilience  ADL's: Intact  Cognition: WNL  Sleep: Number of Hours: 35. 64       Treatment Plan Summary:Patient is a  50 year old African-American female with hx of schizoaffective do , cocaine and alcohol abuse ,diabetes , who presented with worsening depressive sx and psychosis. Pt with continued depressive sx, as well as AH, although progressing .Pt continues to be motivated to attend substance abuse program. Will continue medication management.   Reviewed past medical records,treatment plan. Will continue Seroquel XR to 100 mg po qhs for psychosis/sleep.Plan to taper off if she is more stable. Will continue  Abilify to 5 mg po qam and 10 mg po qpm for psychosis /as well as to augment the effect of seroquel.Will give Abilify maintena IM 400 mg if she agrees. Will DC Remeron for lack of efficacy. Started Restoril 15  mg po qhs for sleep. Will continue Trileptal 300 mg po bid for mood sx. Increased Lamictal to 25 mg po daily and 50 mg po qpm  for mood lability. Will make available PRN medications as per agitation protocol. Will continue Gabapentin 400 mg po bid for anxiety/pain. Continue  Zofran 4 mg po prn for nausea. Continue Protonix to 40 mg po bid bc for GERD. Wheel chair privileges . Elevate foot for pedal edema-chronic. Will restart home medications where indicated.  Will continue to monitor vitals ,medication compliance and treatment side effects while patient is here.  Will monitor for medical issues as well as call consult as needed.  Reviewed labs ,prolactin elevated - Abilify will help with the same. Pt to follow up with outpt provider for monitoring . CSW will start working on disposition. Pt is motivated to go to a substance abuse program. PPD provided . Patient to participate in therapeutic milieu .       Carlinda Ohlson, MD  07/29/2015, 11:24 AM

## 2015-07-29 NOTE — Progress Notes (Signed)
Adult Psychoeducational Group Note  Date:  07/29/2015 Time:  7:47 PM  Group Topic/Focus:  Wrap-Up Group:   The focus of this group is to help patients review their daily goal of treatment and discuss progress on daily workbooks.  Participation Level:  Active  Participation Quality:  Appropriate and Attentive  Affect:  Appropriate  Cognitive:  Appropriate  Insight: Appropriate  Engagement in Group:  Engaged  Modes of Intervention:  Discussion  Additional Comments:  Pt stated her goal is to sleep tonight. Pt stated one positive thing today is that she woke up.  Clint Bolder 07/29/2015, 7:47 PM

## 2015-07-29 NOTE — Progress Notes (Signed)
D Maureen Swanson is status quo: acutely depressed, flat and disconnected. She talks little regarding true feelings . After she spoke to Dr. Shea Swanson about her right leg, Dr Maureen Swanson requested her to use a wheelchair today, to keep her right leg elevated and to stay off of it AMAP and also a ted hose was put on her lower right leg ( by this writer) and there is no diference in the size and / or amount of edema ( if any) on right lower leg... than was observed this morning when hose was applied. Pt has spent most of this  Day sitting in the wheelchair. A She completed her daily assessment this morning and on it she rated her depression, hopelessness and anxiety "9/9/9/", respectively. And she rated she has had SI  today, but she contracted for safety with this Probation officer. R Safety in place.

## 2015-07-29 NOTE — Plan of Care (Signed)
Problem: Diagnosis: Increased Risk For Suicide Attempt Goal: LTG-Patient Will Report Improved Mood and Deny Suicidal LTG (by discharge) Patient will report improved mood and deny suicidal ideation.  Outcome: Progressing Pt stated she was feeling better and denies SI at this time

## 2015-07-29 NOTE — B[facility] Group Notes (Signed)
[place] Group Notes:  (Clinical Social Work)  07/29/2015  [place] Group Notes:  (Clinical Social Work)  07/29/2015  11:00AM-12:00PM  Summary of Progress/Problems:  The main focus of today's process group was to listen to a variety of genres of music and to identify that different types of music provoke different responses.  The patient then was able to identify personally what was soothing for them, as well as energizing.  The patient expressed understanding of concepts, as well as knowledge of how each type of music affected her and how this can be used at home as a wellness/recovery tool.  She said she was in pain at the beginning of group, left after about 45 minutes and did not return.  Type of Therapy:  Music Therapy   Participation Level:  Active  Participation Quality:  Attentive   Affect:  Blunted  Cognitive:  Oriented  Insight:  Engaged  Engagement in Therapy:  Engaged  Modes of Intervention:   Activity, Exploration  Selmer Dominion, LCSW 07/29/2015

## 2015-07-29 NOTE — Progress Notes (Signed)
Adult Psychoeducational Group Note  Date:  07/29/2015 Time:  9:11 PM  Group Topic/Focus:  Wrap-Up Group:   The focus of this group is to help patients review their daily goal of treatment and discuss progress on daily workbooks.  Participation Level:  Active  Participation Quality:  Appropriate and Attentive  Affect:  Appropriate  Cognitive:  Appropriate  Insight: Appropriate  Engagement in Group:  Engaged  Modes of Intervention:  Discussion  Additional Comments:  Pt stated her goal for today was to talk to her son, she did and it went well. Pt stated her goal for tomorrow is to get her medications straight again.  Clint Bolder 07/29/2015, 9:11 PM

## 2015-07-30 LAB — GLUCOSE, CAPILLARY
GLUCOSE-CAPILLARY: 166 mg/dL — AB (ref 65–99)
GLUCOSE-CAPILLARY: 113 mg/dL — AB (ref 65–99)
Glucose-Capillary: 93 mg/dL (ref 65–99)
Glucose-Capillary: 80 mg/dL (ref 65–99)
Glucose-Capillary: 101 mg/dL — ABNORMAL HIGH (ref 65–99)

## 2015-07-30 LAB — URINE CULTURE: SPECIAL REQUESTS: NORMAL

## 2015-07-30 NOTE — BHH Group Notes (Signed)
[facility] LCSW Group Therapy  07/30/2015 1:15 pm  Type of Therapy: Process Group Therapy  Participation Level:  Active  Participation Quality:  Appropriate  Affect:  Flat  Cognitive:  Oriented  Insight:  Improving  Engagement in Group:  Limited  Engagement in Therapy:  Limited  Modes of Intervention:  Activity, Clarification, Education, Problem-solving and Support  Summary of Progress/Problems: Today's group addressed the issue of overcoming obstacles.  Patients were asked to identify their biggest obstacle post d/c that stands in the way of their on-going success, and then problem solve as to how to manage this.  "My son keeps asking me for help, so I focus on him or worry about him, and then I don't take care of myself the way I need to."  No further elaboration.  [name] 07/30/2015   3:49 PM

## 2015-07-30 NOTE — Progress Notes (Signed)
DAR Note: Maureen Swanson has been in the day room much of the day.  She continues to report that she is having suicidal thoughts but is able to contract for safety.  She reported that she attempted to bite herself no evidence noted.  She continues to report that she is hearing voices telling her to harm herself.  She is able to contract for safety on the unit.  She c/o of constipation and milk of magnesia given and she will notify staff if that is effective.  She completed her self inventory and reports that her depression, hopelessness and anxiety are 9/10.  She states that her goal for today is "ap place to live and to take medications like I am supposed to."  She states that she will accomplish this goal by "trying harder."  Encouraged continued participation in group and unit activities.  Q 15 minute checks maintained for safety.  We will continue to monitor the progress towards her goals.

## 2015-07-30 NOTE — Progress Notes (Signed)
[facility] MD Progress Note  07/30/2015 12:43 PM Maureen Swanson  MRN:  [id number]  Subjective:  Patient reports " I am not doing well today, I wasn't able to get sleep because my roommate kept me up."  Objective: Maureen Swanson is awake, alert and oriented X3 , found sitting in wheelchair in the dayroom interacting with peers.  Denies suicidal or homicidal ideation. Reports auditory hallucination that are ongoing. Patient denies command auditory hallucinations. Patient denies visual hallucination and does not appear to be responding to internal stimuli. Patient reports interacting well with staff and others. Patient reports she is medication compliant without mediation side effects. States her depression 8/10. Patient states "I am not doing well today, due to my roommate kept me up."  Reports fair appetite. States she normally rest without issues.  Support, encouragement and reassurance was provided.    Principal Problem: Schizoaffective disorder, bipolar type ([place]) Diagnosis:   Patient Active Problem List   Diagnosis Date Noted  . Tobacco use disorder [F17.200] 07/20/2015  . Diabetes mellitus ([place]) [E11.9] 07/20/2015  . Suicidal ideation [R45.851]   . Cocaine use disorder, moderate, dependence ([place]) [F14.20] 04/03/2015  . Schizoaffective disorder, bipolar type ([place]) [F25.0] 04/03/2015  . Diabetes ([place]) [E11.9] 04/03/2015  . Alcohol use disorder, moderate, dependence ([place]) [F10.20] 04/03/2015  . Conversion disorder [F44.9] 12/09/2014  . Acute ischemic stroke ([place]) [I63.9] 12/08/2014  . Left-sided weakness [M62.89] 12/08/2014  . 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 Knee pain [M25.569] 10/17/2010  . HYPERGLYCEMIA [R73.09] 08/16/2010  . MAMMOGRAM, ABNORMAL, RIGHT [R92.8] 08/05/2010  . HYPERLIPIDEMIA [E78.5] 06/21/2010  . AMENORRHEA, SECONDARY [N91.2] 10/29/2009  . 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 HELICOBACTER PYLORI INFECTION [A04.8] 04/02/2009  . GERD [K21.9] 10/11/2007  .  Essential hypertension [I10] 02/26/2007   Total Time spent with patient: 25 minutes  Past Psychiatric History: see above noted  Past Medical History:  Past Medical History  Diagnosis Date  . High cholesterol   . Depression   . Gout   . Anxiety   . Bipolar 1 disorder ([place])   . GERD (gastroesophageal reflux disease)   . Substance abuse     crack cocaine  . Headache(784.0)     migraines  . TMJ (temporomandibular joint disorder)   . Asthma     daily and prn inhalers  . Arthritis     back and knees  . Gastric ulcer   . 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 Hypertension     under control, has been on med. x 12 yrs.  . Diabetes mellitus     diet-controlled  . CHF (congestive heart failure) [facility])     Past Surgical History  Procedure Laterality Date  . Knee arthroscopy w/ partial medial meniscectomy  05/01/2010    right  . Breast lumpectomy with needle localization  04/19/2012    Procedure: BREAST LUMPECTOMY WITH NEEDLE LOCALIZATION;  Surgeon: [name], MD;  Location: [facility];  Service: General;  Laterality: Right;   Family History:  Family History  Problem Relation Age of Onset  . Diabetes Mother   . Breast cancer Mother   . Cancer Father   . Bipolar disorder Maternal Aunt   . Schizophrenia Maternal Grandfather   . Alcoholism Maternal Uncle    Family Psychiatric  History:  Schizophrenia, grandfather Social History:  History  Alcohol Use  . 0.0 oz/week    Comment: reports she drinks 3 40 ounce beers daily  History  Drug Use No    Comment: last use 1/30 (07/18/15)    Social History   Social History  . Marital Status: Single    Spouse Name: N/A  . Number of Children: N/A  . Years of Education: N/A   Social History Main Topics  . Smoking status: Current Every Day Smoker -- 0.50 packs/day for 15 years    Types: Cigarettes    Last Attempt to Quit: 08/02/2012  . Smokeless tobacco: Never Used  . Alcohol Use: 0.0  oz/week     Comment: reports she drinks 3 40 ounce beers daily  . Drug Use: No     Comment: last use 1/30 (07/18/15)  . Sexual Activity: Not Asked   Other Topics Concern  . None   Social History Narrative   Additional Social History:     Sleep: Fair  Appetite:  Fair Current Medications: Current Facility-Administered Medications  Medication Dose Route Frequency Provider Last Rate Last Dose  . acetaminophen (TYLENOL) tablet 650 mg  650 mg Oral Q6H PRN Delfin Gant, NP   650 mg at 07/30/15 0955  . alum & mag hydroxide-simeth (MAALOX/MYLANTA) 200-200-20 MG/5ML suspension 30 mL  30 mL Oral Q4H PRN Delfin Gant, NP      . amLODipine (NORVASC) tablet 5 mg  5 mg Oral Daily Delfin Gant, NP   5 mg at 07/30/15 0803  . ARIPiprazole (ABILIFY) tablet 10 mg  10 mg Oral QPM [name], MD   10 mg at 07/29/15 1713  . ARIPiprazole (ABILIFY) tablet 5 mg  5 mg Oral Daily [name], MD   5 mg at 07/30/15 0804  . ARIPiprazole SUSR 400 mg  400 mg Intramuscular Q28 days [name], MD   400 mg at 07/30/15 1136  . aspirin chewable tablet 81 mg  81 mg Oral Daily Delfin Gant, NP   81 mg at 07/30/15 0804  . atenolol (TENORMIN) tablet 50 mg  50 mg Oral Daily Kathlee Nations, MD   50 mg at 07/30/15 0804  . atorvastatin (LIPITOR) tablet 40 mg  40 mg Oral QPM Delfin Gant, NP   40 mg at 07/29/15 1713  . baclofen (LIORESAL) tablet 10 mg  10 mg Oral TID Delfin Gant, NP   10 mg at 07/30/15 1205  . benztropine (COGENTIN) tablet 0.5 mg  0.5 mg Oral Daily [name], MD   0.5 mg at 07/30/15 0804  . benztropine (COGENTIN) tablet 0.5 mg  0.5 mg Oral QPM Saramma Eappen, MD   0.5 mg at 07/29/15 1713  . calcium carbonate (OS-CAL - dosed in mg of elemental calcium) tablet 500 mg of elemental calcium  1 tablet Oral Q breakfast Delfin Gant, NP   500 mg of elemental calcium at 07/30/15 0803  . celecoxib (CELEBREX) capsule 100 mg  100 mg Oral BID Delfin Gant, NP    100 mg at 07/30/15 0804  . feeding supplement (GLUCERNA SHAKE) (GLUCERNA SHAKE) liquid 237 mL  237 mL Oral TID BM Saramma Eappen, MD   237 mL at 07/30/15 0805  . folic acid (FOLVITE) tablet 1 mg  1 mg Oral Daily Delfin Gant, NP   1 mg at 07/30/15 0804  . furosemide (LASIX) tablet 40 mg  40 mg Oral Daily Delfin Gant, NP   40 mg at 07/30/15 0804  . gabapentin (NEURONTIN) capsule 400 mg  400 mg Oral BH-qamhs Saramma Eappen, MD   400 mg at 07/30/15 0804  .  hydrochlorothiazide (HYDRODIURIL) tablet 25 mg  25 mg Oral Daily Delfin Gant, NP   25 mg at 07/30/15 0804  . hydrOXYzine (ATARAX/VISTARIL) tablet 25 mg  25 mg Oral Q6H PRN Delfin Gant, NP   25 mg at 07/29/15 2122  . insulin aspart (novoLOG) injection 0-9 Units  0-9 Units Subcutaneous TID WC Delfin Gant, NP   1 Units at 07/29/15 1727  . insulin glargine (LANTUS) injection 30 Units  30 Units Subcutaneous QHS Delfin Gant, NP   30 Units at 07/29/15 2121  . lamoTRIgine (LAMICTAL) tablet 25 mg  25 mg Oral Daily [name], MD   25 mg at 07/30/15 0804  . lamoTRIgine (LAMICTAL) tablet 50 mg  50 mg Oral QPM [name], MD   50 mg at 07/29/15 1713  . linagliptin (TRADJENTA) tablet 5 mg  5 mg Oral Daily Delfin Gant, NP   5 mg at 07/30/15 0804  . loratadine (CLARITIN) tablet 10 mg  10 mg Oral Daily PRN [name], MD      . magnesium hydroxide (MILK OF MAGNESIA) suspension 30 mL  30 mL Oral Daily PRN Delfin Gant, NP      . metFORMIN (GLUCOPHAGE) tablet 1,000 mg  1,000 mg Oral BID WC Delfin Gant, NP   1,000 mg at 07/30/15 0805  . multivitamin with minerals tablet 1 tablet  1 tablet Oral Daily Delfin Gant, NP   1 tablet at 07/30/15 0805  . naltrexone (DEPADE) tablet 50 mg  50 mg Oral Daily Delfin Gant, NP   50 mg at 07/30/15 0804  . nicotine (NICODERM CQ - dosed in mg/24 hours) patch 21 mg  21 mg Transdermal Daily Nicholaus Bloom, MD   21 mg at 07/30/15 0805  . ondansetron  (ZOFRAN-ODT) disintegrating tablet 4 mg  4 mg Oral Q8H PRN [name], MD      . Oxcarbazepine (TRILEPTAL) tablet 300 mg  300 mg Oral BID Kerrie Buffalo, NP   300 mg at 07/30/15 0805  . pantoprazole (PROTONIX) EC tablet 40 mg  40 mg Oral BID AC Saramma Eappen, MD   40 mg at 07/30/15 0700  . potassium chloride SA (K-DUR,KLOR-CON) CR tablet 20 mEq  20 mEq Oral Daily Delfin Gant, NP   20 mEq at 07/30/15 0804  . QUEtiapine (SEROQUEL XR) 24 hr tablet 100 mg  100 mg Oral Q2000 [name], MD   100 mg at 07/29/15 2122  . QUEtiapine (SEROQUEL) tablet 25 mg  25 mg Oral TID PRN [name], MD   25 mg at 07/29/15 2122  . temazepam (RESTORIL) capsule 15 mg  15 mg Oral QHS [name], MD   15 mg at 07/29/15 2122    Lab Results:  Results for orders placed or performed during the hospital encounter of 07/19/15 (from the past 48 hour(s))  Glucose, capillary     Status: Abnormal   Collection Time: 07/28/15  4:36 PM  Result Value Ref Range   Glucose-Capillary 161 (H) 65 - 99 mg/dL   Comment 1 Notify RN    Comment 2 Document in Chart   Glucose, capillary     Status: Abnormal   Collection Time: 07/28/15  8:32 PM  Result Value Ref Range   Glucose-Capillary 111 (H) 65 - 99 mg/dL   Comment 1 Notify RN   Glucose, capillary     Status: Abnormal   Collection Time: 07/29/15  6:14 AM  Result Value Ref Range  Glucose-Capillary 127 (H) 65 - 99 mg/dL   Comment 1 Notify RN   Glucose, capillary     Status: Abnormal   Collection Time: 07/29/15 11:53 AM  Result Value Ref Range   Glucose-Capillary 119 (H) 65 - 99 mg/dL   Comment 1 Notify RN    Comment 2 Document in Chart   Glucose, capillary     Status: Abnormal   Collection Time: 07/29/15  4:45 PM  Result Value Ref Range   Glucose-Capillary 147 (H) 65 - 99 mg/dL   Comment 1 Notify RN    Comment 2 Document in Chart   Glucose, capillary     Status: Abnormal   Collection Time: 07/29/15  8:35 PM  Result Value Ref Range   Glucose-Capillary  166 (H) 65 - 99 mg/dL   Comment 1 Notify RN     Physical Findings: AIMS: Facial and Oral Movements Muscles of Facial Expression: None, normal Lips and Perioral Area: None, normal Jaw: None, normal Tongue: None, normal,Extremity Movements Upper (arms, wrists, hands, fingers): None, normal Lower (legs, knees, ankles, toes): None, normal, Trunk Movements Neck, shoulders, hips: None, normal, Overall Severity Severity of abnormal movements (highest score from questions above): None, normal Incapacitation due to abnormal movements: None, normal Patient's awareness of abnormal movements (rate only patient's report): No Awareness, Dental Status Current problems with teeth and/or dentures?: No Does patient usually wear dentures?: No  CIWA:    COWS:     Musculoskeletal: Strength & Muscle Tone: within normal limits Gait & Station: normal, patient sitting in wheelchair, reports leg pain when standing for long periods of time. Patient leans: N/A  Psychiatric Specialty Exam: Review of Systems  Cardiovascular: Positive for leg swelling (CHRONIC).  Psychiatric/Behavioral: Positive for depression, hallucinations and substance abuse. The patient is nervous/anxious and has insomnia.   All other systems reviewed and are negative.   Blood pressure 123/84, pulse 88, temperature 98.7 F (37.1 C), temperature source Oral, resp. rate 18, height 5\' 4"  (1.626 m), weight 102.513 kg (226 lb), SpO2 100 %.Body mass index is 38.77 kg/(m^2).   General Appearance: Casual, pleasant, calm and cooperative  Eye Contact:: Fair   Speech: Normal Rate  Volume: Decreased  Mood: Anxious, flat and depressed 8/10  Affect:  Depressed   Thought Process: Coherent  Orientation: Full (Time, Place, and Person)  Thought Content: Rumination, paranoia, hallucination - has AH that is derogatory- improving  Suicidal Thoughts:denies- Reports passive suicidal ideations  Homicidal Thoughts:denies  Memory:  Immediate; Fair Recent; Fair Remote; Fair  Judgement: Fair  Insight: Fair  Psychomotor Activity:Decreased   Concentration: fair  Recall: Good  Fund of Knowledge:Good  Language: Good  Akathisia: Negative  Handed: Right  AIMS (if indicated):    Assets: Communication Skills Resilience  ADL's: Intact  Cognition: WNL  Sleep: Number of Hours: 6. 25        I agree with current treatment plan on 07/30/2015, Patient seen face-to-face for psychiatric evaluation follow-up, chart reviewed. Reviewed the information documented and agree with the treatment plan.  Reviewed past medical records,treatment plan. Continue Seroquel XR to 100 mg po qhs for psychosis/sleep.Plan to taper off if she is more stable. Continue  Abilify to 5 mg po qam and 10 mg po qpm for psychosis /as well as to augment the effect of Seroquel.Will give Abilify maintena IM 400 mg if she agrees. Will DC Remeron for lack of efficacy. Continue Restoril 15  mg po qhs for sleep. Continue Trileptal 300 mg po bid for mood sx. Continue  Lamictal to 25 mg po daily and 50 mg po qpm  for mood lability. Will make available PRN medications as per agitation protocol. Continue Gabapentin 400 mg po bid for anxiety/pain. Continue  Zofran 4 mg po prn for nausea. Continue Protonix to 40 mg po bid bc for GERD. Wheel chair privileges . Elevate foot for pedal edema-chronic. Will restart home medications where indicated.  Continue to monitor vitals ,medication compliance and treatment side effects while patient is here.  Will monitor for medical issues as well as call consult as needed.  Reviewed labs ,prolactin elevated - Abilify will help with the same. Pt to follow up with outpt provider for monitoring . CSW will start working on disposition. Pt is motivated to go to a substance abuse program. PPD provided . Patient to participate in therapeutic milieu .    Derrill [place], NP  07/30/2015, 12:43 PM Agree with  NP Progress Note Neita Garnet , MD

## 2015-07-31 LAB — GLUCOSE, CAPILLARY
GLUCOSE-CAPILLARY: 110 mg/dL — AB (ref 65–99)
Glucose-Capillary: 132 mg/dL — ABNORMAL HIGH (ref 65–99)
Glucose-Capillary: 129 mg/dL — ABNORMAL HIGH (ref 65–99)
Glucose-Capillary: 102 mg/dL — ABNORMAL HIGH (ref 65–99)

## 2015-07-31 MED ORDER — MAGNESIUM CITRATE PO SOLN
1.0000 | Freq: Once | ORAL | Status: AC
  Administered 2015-07-31: 1 via ORAL

## 2015-07-31 NOTE — BHH Group Notes (Signed)
[place] LCSW Group Therapy  07/31/2015 , 3:43 PM   Type of Therapy:  Group Therapy  Participation Level:  Active  Participation Quality:  Attentive  Affect:  Appropriate  Cognitive:  Alert  Insight:  Improving  Engagement in Therapy:  Engaged  Modes of Intervention:  Discussion, Exploration and Socialization  Summary of Progress/Problems: Today's group focused on the term Diagnosis.  Participants were asked to define the term, and then pronounce whether it is a negative, positive or neutral term. Maureen Swanson stayed the entire time, was engaged throughout.  Minimal participation.  Agreed with another patient that mandelas help relax her "and take my mind off my problems."  [name] 07/31/2015 , 3:43 PM

## 2015-07-31 NOTE — Progress Notes (Signed)
D: Pt is isolative; was mainly in bed with eye closed for most of the evening. Pt at the time of assessment denies anxiety, depression pain, SI, HI and AVH. Pt is however flat with minimal eye contact. Pt remained calm and cooperative through the shift assessment. A: Medications offered as prescribed.  Support, encouragement, and safe environment provided.  15-minute safety checks continue. R: Pt was med compliant. Safety checks continue.

## 2015-07-31 NOTE — Progress Notes (Addendum)
D: Pt was mainly in bed with eye closed for most of the evening. Pt at the time of assessment endorses moderate anxiety and depression; states my depression is at a 6 and my anxiety is also at a 6. Pt however, denies pain, SI, HI and AVH. Pt remained calm and cooperative through the shift assessment. A: Medications offered as prescribed.  Support, encouragement, and safe environment provided.  15-minute safety checks continue. R: Pt was med compliant. Safety checks continue.
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
## 2015-07-31 NOTE — Progress Notes (Signed)
DAR Note: Maureen Swanson has up in the day room much of the morning.  She attended groups.  Minimal interaction with peers.  She has been c/o of constipation and MOM didn't work yesterday.  MD notified and new order noted for Magnesium citrate and was given after lunch.  She reported that her stomach wasn't feeling well and didn't know what was wrong.  She took the Mag Citrate and is going to lay down.  Checked on her later and she was resting still saying her stomach wasn't feeling "right."  No results from the mag citrate at this time.  She reported to Probation officer that she was hearing voices this morning and having suicidal thoughts but is able to contract for safety.  She has been up walking on the unit today and utilizing the wheel chair as much.  She completed her self inventory and reports that her depression and anxiety are 8/10 and her hopelessness is 9/10. Her goal for today is "trying to find a place to go" and she will accomplish this goal by "making calls-talk to staff."  She reported to writer that her depression gets worse when she thinks about leaving and where she will live when she is discharged.  Encouraged continued participation in group and unit activities.  Q 15 minute checks maintained for safety.  We will continue to monitor the progress towards her goals.

## 2015-07-31 NOTE — Progress Notes (Addendum)
[facility] MD Progress Note  07/31/2015 11:09 AM RHIAN MCCOMMON  MRN:  [id number]  Subjective:  Patient reports " I am fine. My medications are working. I did hear some voices last night, but I feel better today.'   Objective: Patient seen and chart reviewed.Discussed patient with treatment team. [name] with hx of schizoaffective do , cocaine and alcohol abuse , is single , who was living with her boyfriend , who was admitted due to suicidal thoughts, hallucination and plan to stab herself in the stomach.   Bethzy is awake, alert and oriented X3 , found sitting in wheelchair . She appears to be anxious today mostly about her disposition. Pt has been rejected by substance abuse rehab programs . Pt continues to be motivated to get help. Pt  denies suicidal or homicidal ideation. Reports auditory hallucination that are ongoing , is on and off , denies it at this time. Pt reports sleep as fair last night , she slept through half the night, but is OK with the current dose. Per staff - pt continues to be somatic , is compliant on medications, denies ADRs.  Support, encouragement and reassurance was provided.    Principal Problem: Schizoaffective disorder, bipolar type ([place]) Diagnosis:   Patient Active Problem List   Diagnosis Date Noted  . Tobacco use disorder [F17.200] 07/20/2015  . Diabetes mellitus ([place]) [E11.9] 07/20/2015  . Suicidal ideation [R45.851]   . Cocaine use disorder, moderate, dependence ([place]) [F14.20] 04/03/2015  . Schizoaffective disorder, bipolar type ([place]) [F25.0] 04/03/2015  . Diabetes ([place]) [E11.9] 04/03/2015  . Alcohol use disorder, moderate, dependence ([place]) [F10.20] 04/03/2015  . Conversion disorder [F44.9] 12/09/2014  . Acute ischemic stroke ([place]) [I63.9] 12/08/2014  . Left-sided weakness [M62.89] 12/08/2014  . 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 Knee pain [M25.569] 10/17/2010  . HYPERGLYCEMIA [R73.09]  08/16/2010  . MAMMOGRAM, ABNORMAL, RIGHT [R92.8] 08/05/2010  . HYPERLIPIDEMIA [E78.5] 06/21/2010  . AMENORRHEA, SECONDARY [N91.2] 10/29/2009  . 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 HELICOBACTER PYLORI INFECTION [A04.8] 04/02/2009  . GERD [K21.9] 10/11/2007  . Essential hypertension [I10] 02/26/2007   Total Time spent with patient: 25 minutes  Past Psychiatric History: see above noted  Past Medical History:  Past Medical History  Diagnosis Date  . High cholesterol   . Depression   . Gout   . Anxiety   . Bipolar 1 disorder ([place])   . GERD (gastroesophageal reflux disease)   . Substance abuse     crack cocaine  . Headache(784.0)     migraines  . TMJ (temporomandibular joint disorder)   . Asthma     daily and prn inhalers  . Arthritis     back and knees  . Gastric ulcer   . 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 Hypertension     under control, has been on med. x 12 yrs.  . Diabetes mellitus     diet-controlled  . CHF (congestive heart failure) [facility])     Past Surgical History  Procedure Laterality Date  . Knee arthroscopy w/ partial medial meniscectomy  05/01/2010    right  . Breast lumpectomy with needle localization  04/19/2012    Procedure: BREAST LUMPECTOMY WITH NEEDLE LOCALIZATION;  Surgeon: [name], MD;  Location: [place];  Service: General;  Laterality: Right;   Family History:  Family History  Problem Relation Age of Onset  . Diabetes Mother   .  Breast cancer Mother   . Cancer Father   . Bipolar disorder Maternal Aunt   . Schizophrenia Maternal Grandfather   . Alcoholism Maternal Uncle    Family Psychiatric  History:  Schizophrenia, grandfather Social History:  History  Alcohol Use  . 0.0 oz/week    Comment: reports she drinks 3 40 ounce beers daily     History  Drug Use No    Comment: last use 1/30 (07/18/15)    Social History   Social History  . Marital Status: Single    Spouse Name: N/A  . Number of  Children: N/A  . Years of Education: N/A   Social History Main Topics  . Smoking status: Current Every Day Smoker -- 0.50 packs/day for 15 years    Types: Cigarettes    Last Attempt to Quit: 08/02/2012  . Smokeless tobacco: Never Used  . Alcohol Use: 0.0 oz/week     Comment: reports she drinks 3 40 ounce beers daily  . Drug Use: No     Comment: last use 1/30 (07/18/15)  . Sexual Activity: Not Asked   Other Topics Concern  . None   Social History Narrative   Additional Social History:     Sleep: Fair  Appetite:  Fair Current Medications: Current Facility-Administered Medications  Medication Dose Route Frequency Provider Last Rate Last Dose  . acetaminophen (TYLENOL) tablet 650 mg  650 mg Oral Q6H PRN Delfin Gant, NP   650 mg at 07/30/15 2148  . alum & mag hydroxide-simeth (MAALOX/MYLANTA) 200-200-20 MG/5ML suspension 30 mL  30 mL Oral Q4H PRN Delfin Gant, NP      . amLODipine (NORVASC) tablet 5 mg  5 mg Oral Daily Delfin Gant, NP   5 mg at 07/31/15 0813  . ARIPiprazole (ABILIFY) tablet 10 mg  10 mg Oral QPM Lysette Lindenbaum, MD   10 mg at 07/30/15 1721  . ARIPiprazole (ABILIFY) tablet 5 mg  5 mg Oral Daily [name], MD   5 mg at 07/31/15 0812  . ARIPiprazole SUSR 400 mg  400 mg Intramuscular Q28 days [name], MD   400 mg at 07/30/15 1136  . aspirin chewable tablet 81 mg  81 mg Oral Daily Delfin Gant, NP   81 mg at 07/31/15 [id number]  . atenolol (TENORMIN) tablet 50 mg  50 mg Oral Daily Kathlee Nations, MD   50 mg at 07/31/15 [id number]  . atorvastatin (LIPITOR) tablet 40 mg  40 mg Oral QPM Delfin Gant, NP   40 mg at 07/30/15 1720  . baclofen (LIORESAL) tablet 10 mg  10 mg Oral TID Delfin Gant, NP   10 mg at 07/31/15 0813  . benztropine (COGENTIN) tablet 0.5 mg  0.5 mg Oral Daily [name], MD   0.5 mg at 07/31/15 0812  . benztropine (COGENTIN) tablet 0.5 mg  0.5 mg Oral QPM Peighton Edgin, MD   0.5 mg at 07/30/15 1840  . calcium  carbonate (OS-CAL - dosed in mg of elemental calcium) tablet 500 mg of elemental calcium  1 tablet Oral Q breakfast Delfin Gant, NP   500 mg of elemental calcium at 07/31/15 0813  . celecoxib (CELEBREX) capsule 100 mg  100 mg Oral BID Delfin Gant, NP   100 mg at 07/31/15 0813  . feeding supplement (GLUCERNA SHAKE) (GLUCERNA SHAKE) liquid 237 mL  237 mL Oral TID BM Davona Kinoshita, MD   237 mL at 07/31/15 1007  . folic acid (FOLVITE)  tablet 1 mg  1 mg Oral Daily Delfin Gant, NP   1 mg at 07/31/15 0813  . furosemide (LASIX) tablet 40 mg  40 mg Oral Daily Delfin Gant, NP   40 mg at 07/31/15 0813  . gabapentin (NEURONTIN) capsule 400 mg  400 mg Oral BH-qamhs Meredith Kilbride, MD   400 mg at 07/31/15 [id number]  . hydrochlorothiazide (HYDRODIURIL) tablet 25 mg  25 mg Oral Daily Delfin Gant, NP   25 mg at 07/31/15 0813  . hydrOXYzine (ATARAX/VISTARIL) tablet 25 mg  25 mg Oral Q6H PRN Delfin Gant, NP   25 mg at 07/29/15 2122  . insulin aspart (novoLOG) injection 0-9 Units  0-9 Units Subcutaneous TID WC Delfin Gant, NP   1 Units at 07/31/15 [phone]  . insulin glargine (LANTUS) injection 30 Units  30 Units Subcutaneous QHS Delfin Gant, NP   30 Units at 07/30/15 2151  . lamoTRIgine (LAMICTAL) tablet 25 mg  25 mg Oral Daily [name], MD   25 mg at 07/31/15 0813  . lamoTRIgine (LAMICTAL) tablet 50 mg  50 mg Oral QPM [name], MD   50 mg at 07/30/15 1839  . linagliptin (TRADJENTA) tablet 5 mg  5 mg Oral Daily Delfin Gant, NP   5 mg at 07/31/15 0813  . loratadine (CLARITIN) tablet 10 mg  10 mg Oral Daily PRN [name], MD      . magnesium hydroxide (MILK OF MAGNESIA) suspension 30 mL  30 mL Oral Daily PRN Delfin Gant, NP   30 mL at 07/30/15 1505  . metFORMIN (GLUCOPHAGE) tablet 1,000 mg  1,000 mg Oral BID WC Delfin Gant, NP   1,000 mg at 07/31/15 [id number]  . multivitamin with minerals tablet 1 tablet  1 tablet Oral Daily Delfin Gant, NP   1 tablet at 07/31/15 [id number]  . naltrexone (DEPADE) tablet 50 mg  50 mg Oral Daily Delfin Gant, NP   50 mg at 07/31/15 [id number]  . nicotine (NICODERM CQ - dosed in mg/24 hours) patch 21 mg  21 mg Transdermal Daily Nicholaus Bloom, MD   21 mg at 07/31/15 [id number]  . ondansetron (ZOFRAN-ODT) disintegrating tablet 4 mg  4 mg Oral Q8H PRN [name], MD      . Oxcarbazepine (TRILEPTAL) tablet 300 mg  300 mg Oral BID Kerrie Buffalo, NP   300 mg at 07/31/15 [id number]  . pantoprazole (PROTONIX) EC tablet 40 mg  40 mg Oral BID AC [name], MD   40 mg at 07/31/15 [id number]  . potassium chloride SA (K-DUR,KLOR-CON) CR tablet 20 mEq  20 mEq Oral Daily Delfin Gant, NP   20 mEq at 07/31/15 0813  . QUEtiapine (SEROQUEL XR) 24 hr tablet 100 mg  100 mg Oral Q2000 [name], MD   100 mg at 07/30/15 2148  . QUEtiapine (SEROQUEL) tablet 25 mg  25 mg Oral TID PRN [name], MD   25 mg at 07/30/15 2148  . temazepam (RESTORIL) capsule 15 mg  15 mg Oral QHS [name], MD   15 mg at 07/30/15 2148    Lab Results:  Results for orders placed or performed during the hospital encounter of 07/19/15 (from the past 48 hour(s))  Glucose, capillary     Status: Abnormal   Collection Time: 07/29/15 11:53 AM  Result Value Ref Range   Glucose-Capillary 119 (H) 65 - 99 mg/dL   Comment 1 Notify RN  Comment 2 Document in Chart   Glucose, capillary     Status: Abnormal   Collection Time: 07/29/15  4:45 PM  Result Value Ref Range   Glucose-Capillary 147 (H) 65 - 99 mg/dL   Comment 1 Notify RN    Comment 2 Document in Chart   Glucose, capillary     Status: Abnormal   Collection Time: 07/29/15  8:35 PM  Result Value Ref Range   Glucose-Capillary 166 (H) 65 - 99 mg/dL   Comment 1 Notify RN   Glucose, capillary     Status: None   Collection Time: 07/30/15  6:12 AM  Result Value Ref Range   Glucose-Capillary 93 65 - 99 mg/dL  Glucose, capillary     Status: None   Collection Time: 07/30/15 11:50 AM   Result Value Ref Range   Glucose-Capillary 80 65 - 99 mg/dL   Comment 1 Notify RN    Comment 2 Document in Chart   Glucose, capillary     Status: Abnormal   Collection Time: 07/30/15  4:42 PM  Result Value Ref Range   Glucose-Capillary 101 (H) 65 - 99 mg/dL   Comment 1 Notify RN    Comment 2 Document in Chart   Glucose, capillary     Status: Abnormal   Collection Time: 07/30/15  8:20 PM  Result Value Ref Range   Glucose-Capillary 113 (H) 65 - 99 mg/dL   Comment 1 Notify RN    Comment 2 Document in Chart   Glucose, capillary     Status: Abnormal   Collection Time: 07/31/15  6:25 AM  Result Value Ref Range   Glucose-Capillary 132 (H) 65 - 99 mg/dL    Physical Findings: AIMS: Facial and Oral Movements Muscles of Facial Expression: None, normal Lips and Perioral Area: None, normal Jaw: None, normal Tongue: None, normal,Extremity Movements Upper (arms, wrists, hands, fingers): None, normal Lower (legs, knees, ankles, toes): None, normal, Trunk Movements Neck, shoulders, hips: None, normal, Overall Severity Severity of abnormal movements (highest score from questions above): None, normal Incapacitation due to abnormal movements: None, normal Patient's awareness of abnormal movements (rate only patient's report): No Awareness, Dental Status Current problems with teeth and/or dentures?: No Does patient usually wear dentures?: No  CIWA:    COWS:     Musculoskeletal: Strength & Muscle Tone: within normal limits Gait & Station: normal, patient sitting in wheelchair, reports leg pain when standing for long periods of time. Patient leans: N/A  Psychiatric Specialty Exam: Review of Systems  Cardiovascular: Positive for leg swelling (CHRONIC).  Psychiatric/Behavioral: Positive for depression, hallucinations and substance abuse. The patient is nervous/anxious and has insomnia.   All other systems reviewed and are negative.   Blood pressure 128/71, pulse 98, temperature 99.3 F  (37.4 C), temperature source Oral, resp. rate 16, height 5\' 4"  (1.626 m), weight 102.513 kg (226 lb), SpO2 100 %.Body mass index is 38.77 kg/(m^2).   General Appearance: Casual, pleasant, calm and cooperative  Eye Contact:: Fair   Speech: Normal Rate  Volume: Decreased  Mood: Anxious, depressed - improving  Affect:  Depressed   Thought Process: Coherent  Orientation: Full (Time, Place, and Person)  Thought Content: Rumination, paranoia, hallucination - has AH that is derogatory- improving  Suicidal Thoughts:denies  Homicidal Thoughts:denies  Memory: Immediate; Fair Recent; Fair Remote; Fair  Judgement: Fair  Insight: Fair  Psychomotor Activity:Decreased   Concentration: fair  Recall: [name] Cluck of Knowledge:Good  Language: Good  Akathisia: Negative  Handed: Right  AIMS (  if indicated):    Assets: Communication Skills Resilience  ADL's: Intact  Cognition: WNL  Sleep: Number of Hours: 6. 5        Treatment plan, summary: [name] with hx of schizoaffective do , cocaine and alcohol abuse ,diabetes , who presented with worsening depressive sx and psychosis. Pt with continued depressive sx,although progressing .Pt continues to be motivated to attend substance abuse program. Will continue medication management.   Reviewed past medical records,treatment plan. Continue Seroquel XR to 100 mg po qhs for psychosis/sleep.Plan to taper off if she is more stable. Continue  Abilify to 5 mg po qam and 10 mg po qpm for psychosis /as well as to augment the effect of Seroquel.Abilify maintena IM 400 mg -given on 07/30/15. Continue Restoril 15  mg po qhs for sleep. Continue Trileptal 300 mg po bid for mood sx. Continue Lamictal to 25 mg po daily and 50 mg po qpm  for mood lability. Will make available PRN medications as per agitation protocol. Continue Gabapentin 400 mg po bid for anxiety/pain. Continue   Zofran 4 mg po prn for nausea. Continue Protonix to 40 mg po bid bc for GERD. Wheel chair privileges . Elevate foot for pedal edema-chronic. Will restart home medications where indicated.  Continue to monitor vitals ,medication compliance and treatment side effects while patient is here.  Will monitor for medical issues as well as call consult as needed.  Reviewed labs ,prolactin elevated - Abilify will help with the same. Pt to follow up with outpt provider for monitoring . CSW will start working on disposition. Pt is motivated to go to a substance abuse program. Patient to participate in therapeutic milieu .    I certify that the services received since the previous certification/recertification were and continue to be medically necessary as the treatment provided can be reasonably expected to improve the patient's condition; the medical record documents that the services furnished were intensive treatment services or their equivalent services, and this patient continues to need, on a daily basis, active treatment furnished directly by or requiring the supervision of inpatient psychiatric personnel.    Chadwick Reiswig, MD  07/31/2015, 11:09 AM

## 2015-07-31 NOTE — BHH Group Notes (Signed)
[place] Group Notes:  (Nursing/MHT/Case Management/Adjunct)  Date:  07/31/2015  Time:  9:30am  Type of Therapy:  Nurse Education  Participation Level:  Minimal  Participation Quality:  Drowsy  Affect:  Blunted  Cognitive:  Drowsy  Insight:  Limited  Engagement in Group:  Developing/Improving  Modes of Intervention:  Discussion and Education  Summary of Progress/Problems:  Group topic was Recovery.  Discussed what recovery means to group members and the importance of goal setting.  Discussed sleep hygiene.  She was very quiet during the group.  She would get annoyed with peers if they talked too much.  She did not participate in the group topic.    Barbette Or Yaeli Hartung 07/31/2015, 10:27 AM

## 2015-08-01 LAB — GLUCOSE, CAPILLARY
GLUCOSE-CAPILLARY: 95 mg/dL (ref 65–99)
Glucose-Capillary: 95 mg/dL (ref 65–99)
Glucose-Capillary: 123 mg/dL — ABNORMAL HIGH (ref 65–99)
Glucose-Capillary: 117 mg/dL — ABNORMAL HIGH (ref 65–99)

## 2015-08-01 NOTE — BHH Group Notes (Signed)
 [place] LCSW Group Therapy  08/01/2015 1:35 PM  Type of Therapy: Group Therapy  Participation Level: Invited. Chose not to attend. In bed asleep.  Summary of Progress/Problems: [name] from the [facility] was here to tell his story of recovery and play his guitar.    Kara Mead. Marshell Levan 08/01/2015 1:35 PM
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
## 2015-08-01 NOTE — Progress Notes (Signed)
DAR Note: Maureen Swanson has been in the day room much of the morning.  She returned to her room this afternoon due to her feet hurting.  She was given tylenol with minimal relief.  She feels that her suicidal thoughts and her voices are getting better.  She is able to contract for safety on the unit.  Minimal interaction with peers and has made some complaints that her roommate goes in and out all night long.  She completed her self inventory and reports that her depression, hopelessness and anxiety are 8/10.  Her goal for today is "depression and going home."  She states that she will accomplish her goal by "talking."  Encouraged continued participation in group and unit activities.  Q 15 minute checks maintained for safety.  We will continue to monitor the progress towards her goals.

## 2015-08-01 NOTE — Progress Notes (Signed)
[facility] MD Progress Note  08/01/2015 2:23 PM CHIYO MARRON  MRN:  [id number]  Subjective:  Patient reports " I am fine."    Objective: Patient seen and chart reviewed.Discussed patient with treatment team. [name] with hx of schizoaffective do , cocaine and alcohol abuse , is single , who was living with her boyfriend , who was admitted due to suicidal thoughts, hallucination and plan to stab herself in the stomach.   Azaleia is awake, alert and oriented X3 , found sitting in wheelchair . She appears to be less anxious today, is more alert , is seen as interactive with peers. Pt has good insight in to her substance abuse problems, pt had been sober for a long time , but relapsed again ( once) prior to admission. Pt currently denies SI/HI , her AH is improving. Pt with improved sleep. Per staff - pt is compliant on medications, denies ADRs.  Support, encouragement and reassurance was provided.    Principal Problem: Schizoaffective disorder, bipolar type ([place]) Diagnosis:   Patient Active Problem List   Diagnosis Date Noted  . Tobacco use disorder [F17.200] 07/20/2015  . Diabetes mellitus ([place]) [E11.9] 07/20/2015  . Suicidal ideation [R45.851]   . Cocaine use disorder, moderate, dependence ([place]) [F14.20] 04/03/2015  . Schizoaffective disorder, bipolar type ([place]) [F25.0] 04/03/2015  . Diabetes ([place]) [E11.9] 04/03/2015  . Alcohol use disorder, moderate, dependence ([place]) [F10.20] 04/03/2015  . Conversion disorder [F44.9] 12/09/2014  . Acute ischemic stroke ([place]) [I63.9] 12/08/2014  . Left-sided weakness [M62.89] 12/08/2014  . 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 Knee pain [M25.569] 10/17/2010  . HYPERGLYCEMIA [R73.09] 08/16/2010  . MAMMOGRAM, ABNORMAL, RIGHT [R92.8] 08/05/2010  . HYPERLIPIDEMIA [E78.5] 06/21/2010  . AMENORRHEA, SECONDARY [N91.2] 10/29/2009  . 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 HELICOBACTER PYLORI INFECTION  [A04.8] 04/02/2009  . GERD [K21.9] 10/11/2007  . Essential hypertension [I10] 02/26/2007   Total Time spent with patient: 25 minutes  Past Psychiatric History: see above noted  Past Medical History:  Past Medical History  Diagnosis Date  . High cholesterol   . Depression   . Gout   . Anxiety   . Bipolar 1 disorder ([place])   . GERD (gastroesophageal reflux disease)   . Substance abuse     crack cocaine  . Headache(784.0)     migraines  . TMJ (temporomandibular joint disorder)   . Asthma     daily and prn inhalers  . Arthritis     back and knees  . Gastric ulcer   . 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 Hypertension     under control, has been on med. x 12 yrs.  . Diabetes mellitus     diet-controlled  . CHF (congestive heart failure) [facility])     Past Surgical History  Procedure Laterality Date  . Knee arthroscopy w/ partial medial meniscectomy  05/01/2010    right  . Breast lumpectomy with needle localization  04/19/2012    Procedure: BREAST LUMPECTOMY WITH NEEDLE LOCALIZATION;  Surgeon: [name], MD;  Location: [place];  Service: General;  Laterality: Right;   Family History:  Family History  Problem Relation Age of Onset  . Diabetes Mother   . Breast cancer Mother   . Cancer Father   . Bipolar disorder Maternal Aunt   . Schizophrenia Maternal Grandfather   . Alcoholism Maternal Uncle    Family Psychiatric  History:  Schizophrenia,  grandfather Social History:  History  Alcohol Use  . 0.0 oz/week    Comment: reports she drinks 3 40 ounce beers daily     History  Drug Use No    Comment: last use 1/30 (07/18/15)    Social History   Social History  . Marital Status: Single    Spouse Name: N/A  . Number of Children: N/A  . Years of Education: N/A   Social History Main Topics  . Smoking status: Current Every Day Smoker -- 0.50 packs/day for 15 years    Types: Cigarettes    Last Attempt to Quit: 08/02/2012  .  Smokeless tobacco: Never Used  . Alcohol Use: 0.0 oz/week     Comment: reports she drinks 3 40 ounce beers daily  . Drug Use: No     Comment: last use 1/30 (07/18/15)  . Sexual Activity: Not Asked   Other Topics Concern  . None   Social History Narrative   Additional Social History:     Sleep: Fair  Appetite:  Fair Current Medications: Current Facility-Administered Medications  Medication Dose Route Frequency Provider Last Rate Last Dose  . acetaminophen (TYLENOL) tablet 650 mg  650 mg Oral Q6H PRN Delfin Gant, NP   650 mg at 08/01/15 1130  . alum & mag hydroxide-simeth (MAALOX/MYLANTA) 200-200-20 MG/5ML suspension 30 mL  30 mL Oral Q4H PRN Delfin Gant, NP      . amLODipine (NORVASC) tablet 5 mg  5 mg Oral Daily Delfin Gant, NP   5 mg at 08/01/15 0824  . ARIPiprazole (ABILIFY) tablet 10 mg  10 mg Oral QPM [name], MD   10 mg at 07/31/15 1718  . ARIPiprazole (ABILIFY) tablet 5 mg  5 mg Oral Daily [name], MD   5 mg at 08/01/15 0826  . ARIPiprazole SUSR 400 mg  400 mg Intramuscular Q28 days [name], MD   400 mg at 07/30/15 1136  . aspirin chewable tablet 81 mg  81 mg Oral Daily Delfin Gant, NP   81 mg at 08/01/15 0825  . atenolol (TENORMIN) tablet 50 mg  50 mg Oral Daily Kathlee Nations, MD   50 mg at 08/01/15 0826  . atorvastatin (LIPITOR) tablet 40 mg  40 mg Oral QPM Delfin Gant, NP   40 mg at 07/31/15 1717  . baclofen (LIORESAL) tablet 10 mg  10 mg Oral TID Delfin Gant, NP   10 mg at 08/01/15 1130  . benztropine (COGENTIN) tablet 0.5 mg  0.5 mg Oral Daily Montario Zilka, MD   0.5 mg at 08/01/15 0825  . benztropine (COGENTIN) tablet 0.5 mg  0.5 mg Oral QPM Brinley Treanor, MD   0.5 mg at 07/31/15 1717  . calcium carbonate (OS-CAL - dosed in mg of elemental calcium) tablet 500 mg of elemental calcium  1 tablet Oral Q breakfast Delfin Gant, NP   500 mg of elemental calcium at 08/01/15 0825  . celecoxib (CELEBREX) capsule  100 mg  100 mg Oral BID Delfin Gant, NP   100 mg at 08/01/15 0825  . feeding supplement (GLUCERNA SHAKE) (GLUCERNA SHAKE) liquid 237 mL  237 mL Oral TID BM Torrence Hammack, MD   237 mL at 08/01/15 1014  . folic acid (FOLVITE) tablet 1 mg  1 mg Oral Daily Delfin Gant, NP   1 mg at 08/01/15 0825  . furosemide (LASIX) tablet 40 mg  40 mg Oral Daily Delfin Gant, NP  40 mg at 08/01/15 0824  . gabapentin (NEURONTIN) capsule 400 mg  400 mg Oral BH-qamhs Ethne Jeon, MD   400 mg at 08/01/15 0825  . hydrochlorothiazide (HYDRODIURIL) tablet 25 mg  25 mg Oral Daily Delfin Gant, NP   25 mg at 08/01/15 0824  . hydrOXYzine (ATARAX/VISTARIL) tablet 25 mg  25 mg Oral Q6H PRN Delfin Gant, NP   25 mg at 07/29/15 2122  . insulin aspart (novoLOG) injection 0-9 Units  0-9 Units Subcutaneous TID WC Delfin Gant, NP   1 Units at 07/31/15 1715  . insulin glargine (LANTUS) injection 30 Units  30 Units Subcutaneous QHS Delfin Gant, NP   30 Units at 07/31/15 2213  . lamoTRIgine (LAMICTAL) tablet 25 mg  25 mg Oral Daily [name], MD   25 mg at 08/01/15 0827  . lamoTRIgine (LAMICTAL) tablet 50 mg  50 mg Oral QPM Koral Thaden, MD   50 mg at 07/31/15 1717  . linagliptin (TRADJENTA) tablet 5 mg  5 mg Oral Daily Delfin Gant, NP   5 mg at 08/01/15 [id number]  . loratadine (CLARITIN) tablet 10 mg  10 mg Oral Daily PRN [name], MD      . magnesium hydroxide (MILK OF MAGNESIA) suspension 30 mL  30 mL Oral Daily PRN Delfin Gant, NP   30 mL at 07/30/15 1505  . metFORMIN (GLUCOPHAGE) tablet 1,000 mg  1,000 mg Oral BID WC Delfin Gant, NP   1,000 mg at 08/01/15 0826  . multivitamin with minerals tablet 1 tablet  1 tablet Oral Daily Delfin Gant, NP   1 tablet at 08/01/15 0825  . naltrexone (DEPADE) tablet 50 mg  50 mg Oral Daily Delfin Gant, NP   50 mg at 08/01/15 0825  . nicotine (NICODERM CQ - dosed in mg/24 hours) patch 21 mg  21 mg Transdermal  Daily Nicholaus Bloom, MD   21 mg at 08/01/15 0835  . ondansetron (ZOFRAN-ODT) disintegrating tablet 4 mg  4 mg Oral Q8H PRN [name], MD      . Oxcarbazepine (TRILEPTAL) tablet 300 mg  300 mg Oral BID Kerrie Buffalo, NP   300 mg at 08/01/15 0824  . pantoprazole (PROTONIX) EC tablet 40 mg  40 mg Oral BID AC [name], MD   40 mg at 08/01/15 0622  . potassium chloride SA (K-DUR,KLOR-CON) CR tablet 20 mEq  20 mEq Oral Daily Delfin Gant, NP   20 mEq at 08/01/15 0824  . QUEtiapine (SEROQUEL XR) 24 hr tablet 100 mg  100 mg Oral Q2000 Chan Sheahan, MD   100 mg at 07/31/15 2030  . QUEtiapine (SEROQUEL) tablet 25 mg  25 mg Oral TID PRN [name], MD   25 mg at 07/30/15 2148  . temazepam (RESTORIL) capsule 15 mg  15 mg Oral QHS [name], MD   15 mg at 07/31/15 2212    Lab Results:  Results for orders placed or performed during the hospital encounter of 07/19/15 (from the past 48 hour(s))  Glucose, capillary     Status: Abnormal   Collection Time: 07/30/15  4:42 PM  Result Value Ref Range   Glucose-Capillary 101 (H) 65 - 99 mg/dL   Comment 1 Notify RN    Comment 2 Document in Chart   Glucose, capillary     Status: Abnormal   Collection Time: 07/30/15  8:20 PM  Result Value Ref Range   Glucose-Capillary 113 (H) 65 - 99  mg/dL   Comment 1 Notify RN    Comment 2 Document in Chart   Glucose, capillary     Status: Abnormal   Collection Time: 07/31/15  6:25 AM  Result Value Ref Range   Glucose-Capillary 132 (H) 65 - 99 mg/dL  Glucose, capillary     Status: Abnormal   Collection Time: 07/31/15 12:01 PM  Result Value Ref Range   Glucose-Capillary 110 (H) 65 - 99 mg/dL   Comment 1 Notify RN    Comment 2 Document in Chart   Glucose, capillary     Status: Abnormal   Collection Time: 07/31/15  5:04 PM  Result Value Ref Range   Glucose-Capillary 129 (H) 65 - 99 mg/dL   Comment 1 Notify RN    Comment 2 Document in Chart   Glucose, capillary     Status: Abnormal    Collection Time: 07/31/15  8:33 PM  Result Value Ref Range   Glucose-Capillary 102 (H) 65 - 99 mg/dL   Comment 1 Notify RN   Glucose, capillary     Status: None   Collection Time: 08/01/15  6:18 AM  Result Value Ref Range   Glucose-Capillary 95 65 - 99 mg/dL  Glucose, capillary     Status: None   Collection Time: 08/01/15 11:55 AM  Result Value Ref Range   Glucose-Capillary 95 65 - 99 mg/dL   Comment 1 Notify RN    Comment 2 Document in Chart     Physical Findings: AIMS: Facial and Oral Movements Muscles of Facial Expression: None, normal Lips and Perioral Area: None, normal Jaw: None, normal Tongue: None, normal,Extremity Movements Upper (arms, wrists, hands, fingers): None, normal Lower (legs, knees, ankles, toes): None, normal, Trunk Movements Neck, shoulders, hips: None, normal, Overall Severity Severity of abnormal movements (highest score from questions above): None, normal Incapacitation due to abnormal movements: None, normal Patient's awareness of abnormal movements (rate only patient's report): No Awareness, Dental Status Current problems with teeth and/or dentures?: No Does patient usually wear dentures?: No  CIWA:    COWS:     Musculoskeletal: Strength & Muscle Tone: within normal limits Gait & Station: normal, patient sitting in wheelchair, reports leg pain when standing for long periods of time. Patient leans: N/A  Psychiatric Specialty Exam: Review of Systems  Cardiovascular: Positive for leg swelling (CHRONIC).  Psychiatric/Behavioral: Positive for depression and substance abuse. The patient is nervous/anxious.   All other systems reviewed and are negative.   Blood pressure 121/80, pulse 91, temperature 98.9 F (37.2 C), temperature source Oral, resp. rate 18, height 5\' 4"  (1.626 m), weight 102.513 kg (226 lb), SpO2 100 %.Body mass index is 38.77 kg/(m^2).   General Appearance: Casual, pleasant, calm and cooperative  Eye Contact:: Fair   Speech:  Normal Rate  Volume: Decreased  Mood: Anxious, depressed - improving  Affect:  Depressed   Thought Process: Coherent  Orientation: Full (Time, Place, and Person)  Thought Content: Rumination, paranoia, hallucination - has AH that is derogatory- improving  Suicidal Thoughts:denies  Homicidal Thoughts:denies  Memory: Immediate; Fair Recent; Fair Remote; Fair  Judgement: Fair  Insight: Fair  Psychomotor Activity:Decreased   Concentration: fair  Recall: Good  Fund of Knowledge:Good  Language: Good  Akathisia: Negative  Handed: Right  AIMS (if indicated):    Assets: Communication Skills Resilience  ADL's: Intact  Cognition: WNL  Sleep: Number of Hours: 5.75 hrs        Treatment plan, summary: [name] with hx of  schizoaffective do , cocaine and alcohol abuse ,diabetes , who presented with worsening depressive sx and psychosis. Pt with improvement of her symptoms. Will continue medication management.   Reviewed past medical records,treatment plan. Continue Seroquel XR  100 mg po qhs for psychosis/sleep.Plan to taper off if she is more stable. Continue  Abilify 5 mg po qam and 10 mg po qpm for psychosis /as well as to augment the effect of Seroquel.Abilify maintena IM 400 mg -given on 07/30/15. Continue Restoril 15  mg po qhs for sleep. Continue Trileptal 300 mg po bid for mood sx. Continue Lamictal to 25 mg po daily and 50 mg po qpm  for mood lability. Will make available PRN medications as per agitation protocol. Continue Gabapentin 400 mg po bid for anxiety/pain. Continue  Zofran 4 mg po prn for nausea. Continue Protonix to 40 mg po bid bc for GERD. Wheel chair privileges . Elevate foot for pedal edema-chronic. Will restart home medications where indicated.  Continue to monitor vitals ,medication compliance and treatment side effects while patient is here.  Will monitor for medical issues as  well as call consult as needed.  Reviewed labs ,prolactin elevated - Abilify will help with the same. Pt to follow up with outpt provider for monitoring . CSW will start working on disposition. Pt is motivated to go to a substance abuse program. Patient to participate in therapeutic milieu .     Ansh Fauble, MD  08/01/2015, 2:23 PM

## 2015-08-01 NOTE — BHH Group Notes (Signed)
[facility] LCSW Aftercare Discharge Planning Group Note   08/01/2015 11:43 AM  Participation Quality:  Minimal Mood/Affect:  Flat  Depression Rating:    Anxiety Rating:    Thoughts of Suicide:  No Will you contract for safety?   NA  Current AVH:  No  Plan for Discharge/Comments:  Maureen Swanson is resigned to leaving tomorrow.  "The Dr told me I am leaving, so I know it is true."  States she talked to her ex, and he told her she could come back.  She plans on contacting her son today.  Says she will present to the [facility] tomorrow "to see if they can help."  Transportation Means:   Supports:  [name]

## 2015-08-01 NOTE — Progress Notes (Signed)
D:Patient in the dayroom on approach.  Patient states she feels better today.  Patient states she plans on discharging tomorrow to her ex boyfriends house.  Patient states this is not the ideal living situation but it is her only option.  Patient denies SI/HI and denies AVH.  Patient states she has been taking her medications as prescribed and feels they are helping some.  Patient states that she is agitated but some of the other patients on the unit.  Patient states she met her goal for today which was to go home tomorrow.   A: Staff to monitor Q 15 mins for safety.  Encouragement and support offered.  Scheduled medications administered per orders.  Seroquel administered prn for agitation. R: Patient remains safe on the unit.  Patient attended group tonight.  Patient visible on the unit.  Patient taking administered medications.

## 2015-08-02 LAB — GLUCOSE, CAPILLARY: Glucose-Capillary: 117 mg/dL — ABNORMAL HIGH (ref 65–99)

## 2015-08-02 MED ORDER — LAMOTRIGINE 25 MG PO TABS: ORAL_TABLET | ORAL | Status: DC

## 2015-08-02 MED ORDER — INSULIN ASPART 100 UNIT/ML ~~LOC~~ SOLN: 0.0000 [IU] | Freq: Three times a day (TID) | SUBCUTANEOUS | Status: DC

## 2015-08-02 MED ORDER — CELECOXIB 100 MG PO CAPS: 100.0000 mg | ORAL_CAPSULE | Freq: Two times a day (BID) | ORAL | Status: DC

## 2015-08-02 MED ORDER — HYDROXYZINE HCL 25 MG PO TABS: 25.0000 mg | ORAL_TABLET | Freq: Four times a day (QID) | ORAL | Status: DC | PRN

## 2015-08-02 MED ORDER — BACLOFEN 10 MG PO TABS: 10.0000 mg | ORAL_TABLET | Freq: Three times a day (TID) | ORAL | Status: DC

## 2015-08-02 MED ORDER — FOLIC ACID 1 MG PO TABS: 1.0000 mg | ORAL_TABLET | Freq: Every day | ORAL | Status: DC

## 2015-08-02 MED ORDER — ARIPIPRAZOLE ER 400 MG IM SUSR: 400.0000 mg | INTRAMUSCULAR | Status: DC

## 2015-08-02 MED ORDER — TEMAZEPAM 15 MG PO CAPS: 15.0000 mg | ORAL_CAPSULE | Freq: Every day | ORAL | Status: DC

## 2015-08-02 MED ORDER — ASPIRIN 81 MG PO TABS: 81.0000 mg | ORAL_TABLET | Freq: Every day | ORAL | Status: DC

## 2015-08-02 MED ORDER — POTASSIUM CHLORIDE CRYS ER 20 MEQ PO TBCR: 20.0000 meq | EXTENDED_RELEASE_TABLET | Freq: Every day | ORAL | Status: DC

## 2015-08-02 MED ORDER — METFORMIN HCL 1000 MG PO TABS: 1000.0000 mg | ORAL_TABLET | Freq: Two times a day (BID) | ORAL | Status: DC

## 2015-08-02 MED ORDER — NICOTINE 21 MG/24HR TD PT24: 21.0000 mg | MEDICATED_PATCH | Freq: Every day | TRANSDERMAL | Status: DC

## 2015-08-02 MED ORDER — CALCIUM CARBONATE 1250 (500 CA) MG PO TABS: 1.0000 | ORAL_TABLET | Freq: Every day | ORAL | Status: DC

## 2015-08-02 MED ORDER — INSULIN GLARGINE 100 UNIT/ML ~~LOC~~ SOLN: 30.0000 [IU] | Freq: Every day | SUBCUTANEOUS | Status: DC

## 2015-08-02 MED ORDER — BENZTROPINE MESYLATE 0.5 MG PO TABS: ORAL_TABLET | ORAL | Status: DC

## 2015-08-02 MED ORDER — LINAGLIPTIN 5 MG PO TABS: 5.0000 mg | ORAL_TABLET | Freq: Every day | ORAL | Status: DC

## 2015-08-02 MED ORDER — BENZTROPINE MESYLATE 0.5 MG PO TABS
0.5000 mg | ORAL_TABLET | ORAL | Status: DC
  Filled 2015-08-02 (×2): qty 1

## 2015-08-02 MED ORDER — ATORVASTATIN CALCIUM 40 MG PO TABS: 40.0000 mg | ORAL_TABLET | Freq: Every evening | ORAL | Status: DC

## 2015-08-02 MED ORDER — FUROSEMIDE 40 MG PO TABS: 40.0000 mg | ORAL_TABLET | Freq: Every day | ORAL | Status: DC

## 2015-08-02 MED ORDER — ATENOLOL 100 MG PO TABS: 100.0000 mg | ORAL_TABLET | Freq: Every day | ORAL | Status: DC

## 2015-08-02 MED ORDER — PANTOPRAZOLE SODIUM 40 MG PO TBEC: 40.0000 mg | DELAYED_RELEASE_TABLET | Freq: Two times a day (BID) | ORAL | Status: DC

## 2015-08-02 MED ORDER — OXCARBAZEPINE 300 MG PO TABS: 300.0000 mg | ORAL_TABLET | Freq: Two times a day (BID) | ORAL | Status: DC

## 2015-08-02 MED ORDER — QUETIAPINE FUMARATE ER 50 MG PO TB24: 100.0000 mg | ORAL_TABLET | Freq: Every day | ORAL | Status: DC

## 2015-08-02 MED ORDER — NALTREXONE HCL 50 MG PO TABS: 50.0000 mg | ORAL_TABLET | Freq: Every day | ORAL | Status: DC

## 2015-08-02 MED ORDER — HYDROCHLOROTHIAZIDE 25 MG PO TABS: 25.0000 mg | ORAL_TABLET | Freq: Every day | ORAL | Status: DC

## 2015-08-02 MED ORDER — AMLODIPINE BESYLATE 5 MG PO TABS: 5.0000 mg | ORAL_TABLET | Freq: Every day | ORAL | Status: DC

## 2015-08-02 MED ORDER — GABAPENTIN 400 MG PO CAPS: 400.0000 mg | ORAL_CAPSULE | Freq: Two times a day (BID) | ORAL | Status: DC

## 2015-08-02 MED ORDER — ARIPIPRAZOLE 5 MG PO TABS: ORAL_TABLET | ORAL | Status: DC

## 2015-08-02 NOTE — BHH Suicide Risk Assessment (Signed)
[facility] Discharge Suicide Risk Assessment   Principal Problem: Schizoaffective disorder, bipolar type [facility]) Discharge Diagnoses:  Patient Active Problem List   Diagnosis Date Noted  . Tobacco use disorder [F17.200] 07/20/2015  . Diabetes mellitus ([place]) [E11.9] 07/20/2015  . Suicidal ideation [R45.851]   . Cocaine use disorder, moderate, dependence ([place]) [F14.20] 04/03/2015  . Schizoaffective disorder, bipolar type ([place]) [F25.0] 04/03/2015  . Diabetes ([place]) [E11.9] 04/03/2015  . Alcohol use disorder, moderate, dependence ([place]) [F10.20] 04/03/2015  . Conversion disorder [F44.9] 12/09/2014  . Acute ischemic stroke ([place]) [I63.9] 12/08/2014  . Left-sided weakness [M62.89] 12/08/2014  . 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 Knee pain [M25.569] 10/17/2010  . HYPERGLYCEMIA [R73.09] 08/16/2010  . MAMMOGRAM, ABNORMAL, RIGHT [R92.8] 08/05/2010  . HYPERLIPIDEMIA [E78.5] 06/21/2010  . AMENORRHEA, SECONDARY [N91.2] 10/29/2009  . 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 HELICOBACTER PYLORI INFECTION [A04.8] 04/02/2009  . GERD [K21.9] 10/11/2007  . Essential hypertension [I10] 02/26/2007    Total Time spent with patient: 30 minutes  Musculoskeletal: Strength & Muscle Tone: within normal limits Gait & Station: normal Patient leans: N/A  Psychiatric Specialty Exam: Review of Systems  Psychiatric/Behavioral: Positive for substance abuse.  All other systems reviewed and are negative.   Blood pressure 125/81, pulse 89, temperature 98.8 F (37.1 C), temperature source Oral, resp. rate 18, height 5\' 4"  (1.626 m), weight 102.513 kg (226 lb), SpO2 100 %.Body mass index is 38.77 kg/(m^2).  General Appearance: Fairly Groomed  Engineer, water::  Fair  Speech:  Normal Rate409  Volume:  Normal  Mood:  Euthymic  Affect:  Congruent  Thought Process:  Coherent  Orientation:  Full (Time, Place, and Person)  Thought Content:  WDL  Suicidal Thoughts:  No  Homicidal Thoughts:  No  Memory:   Immediate;   Fair Recent;   Fair Remote;   Fair  Judgement:  Fair  Insight:  Fair  Psychomotor Activity:  Normal  Concentration:  Fair  Recall:  [employer] of Knowledge:Fair  Language: Fair  Akathisia:  No  Handed:  Right  AIMS (if indicated):     Assets:  Financial Resources/Insurance  Sleep:  Number of Hours: 6.5  Cognition: WNL  ADL's:  Intact   Mental Status Per Nursing Assessment::   On Admission:  Suicidal ideation indicated by patient, Suicide plan, Plan includes specific time, place, or method, Self-harm thoughts, Intention to act on suicide plan, Thoughts of violence towards others  Demographic Factors:  Low socioeconomic status and Unemployed  Loss Factors: NA  Historical Factors: Impulsivity  Risk Reduction Factors:   Positive therapeutic relationship  Continued Clinical Symptoms:  Alcohol/Substance Abuse/Dependencies Previous Psychiatric Diagnoses and Treatments  Cognitive Features That Contribute To Risk:  Thought constriction (tunnel vision)    Suicide Risk:  Minimal: No identifiable suicidal ideation.  Patients presenting with no risk factors but with morbid ruminations; may be classified as minimal risk based on the severity of the depressive symptoms  Follow-up Information    Follow up with [facility].   Contact information:   ATTN: Leslie Oneal [address], [place] [address] Phone: [phone] Fax: [phone]      Follow up with [facility].   Contact information:   ATTN: Ms. Myriam Jacobson [address], [place] [address] Phone: [phone] Fax: [phone]      Plan Of Care/Follow-up recommendations:  Activity:  no restrictions Diet:  regular Tests:  as needed Other:  follow up with after care  Zariana Strub, MD 08/02/2015, 9:27 AM

## 2015-08-02 NOTE — Progress Notes (Addendum)
D: Pt D/C home as per order. Denies SI, HI, AVH and pain when assessed. Cooperative with D/C procedure. Bus pass given to pt for transportation. A: Scheduled medications administered as per [facility]. D/C instructions reviewed with pt including outside appointments. Medications education done with pt including D/C prescriptions. All belongings from locker 38 returned to pt at time of D/C. Support, availability and encouragement provided to pt. Q 15 minutes checks maintained for safety till time of d/c. R: Pt compliant with ordered medications. Verbalized understanding of d/c instructions and medications. Pt signed belonging sheet in agreement with items received. Safety maintained on and off unit till time of d/c.

## 2015-08-02 NOTE — Discharge Summary (Signed)
Physician Discharge Summary Note  Patient:  Maureen Swanson is an 50 y.o., female MRN:  [id number] DOB:  [birth year] Patient phone:  [phone] (home)  Patient address:   [place] [address],  Total Time spent with patient: 30 minutes  Date of Admission:  07/19/2015 Date of Discharge: 08/02/2015  Reason for Admission:  "nervous breakdown"  Principal Problem: Schizoaffective disorder, bipolar type [facility]) Discharge Diagnoses: Patient Active Problem List   Diagnosis Date Noted  . Schizoaffective disorder, bipolar type ([place]) [F25.0] 04/03/2015    Priority: High  . Tobacco use disorder [F17.200] 07/20/2015  . Diabetes mellitus ([place]) [E11.9] 07/20/2015  . Suicidal ideation [R45.851]   . Cocaine use disorder, moderate, dependence ([place]) [F14.20] 04/03/2015  . Diabetes ([place]) [E11.9] 04/03/2015  . Alcohol use disorder, moderate, dependence ([place]) [F10.20] 04/03/2015  . Conversion disorder [F44.9] 12/09/2014  . Acute ischemic stroke ([place]) [I63.9] 12/08/2014  . Left-sided weakness [M62.89] 12/08/2014  . 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 Knee pain [M25.569] 10/17/2010  . HYPERGLYCEMIA [R73.09] 08/16/2010  . MAMMOGRAM, ABNORMAL, RIGHT [R92.8] 08/05/2010  . HYPERLIPIDEMIA [E78.5] 06/21/2010  . AMENORRHEA, SECONDARY [N91.2] 10/29/2009  . 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 HELICOBACTER PYLORI INFECTION [A04.8] 04/02/2009  . GERD [K21.9] 10/11/2007  . Essential hypertension [I10] 02/26/2007    Past Psychiatric History:  See above noted  Past Medical History:  Past Medical History  Diagnosis Date  . High cholesterol   . Depression   . Gout   . Anxiety   . Bipolar 1 disorder ([place])   . GERD (gastroesophageal reflux disease)   . Substance abuse     crack cocaine  . Headache(784.0)     migraines  . TMJ (temporomandibular joint disorder)   . Asthma     daily and prn inhalers  . Arthritis     back and knees  . Gastric ulcer   . Atypical  ductal hyperplasia of breast 03/2012    right  . Hypertension     under control, has been on med. x 12 yrs.  . Diabetes mellitus     diet-controlled  . CHF (congestive heart failure) [facility])     Past Surgical History  Procedure Laterality Date  . Knee arthroscopy w/ partial medial meniscectomy  05/01/2010    right  . Breast lumpectomy with needle localization  04/19/2012    Procedure: BREAST LUMPECTOMY WITH NEEDLE LOCALIZATION;  Surgeon: [name], MD;  Location: [facility];  Service: General;  Laterality: Right;   Family History:  Family History  Problem Relation Age of Onset  . Diabetes Mother   . Breast cancer Mother   . Cancer Father   . Bipolar disorder Maternal Aunt   . Schizophrenia Maternal Grandfather   . Alcoholism Maternal Uncle    Family Psychiatric  History:  denied Social History:  History  Alcohol Use  . 0.0 oz/week    Comment: reports she drinks 3 40 ounce beers daily     History  Drug Use No    Comment: last use 1/30 (07/18/15)    Social History   Social History  . Marital Status: Single    Spouse Name: N/A  . Number of Children: N/A  . Years of Education: N/A   Social History Main Topics  . Smoking status: Current Every Day Smoker -- 0.50 packs/day for 15 years    Types: Cigarettes    Last Attempt to Quit: 08/02/2012  . Smokeless tobacco: Never  Used  . Alcohol Use: 0.0 oz/week     Comment: reports she drinks 3 40 ounce beers daily  . Drug Use: No     Comment: last use 1/30 (07/18/15)  . Sexual Activity: Not Asked   Other Topics Concern  . None   Social History Narrative    Hospital Course:  Arlenys Kesler, 50 yo female is well known to [facility] reporting a chronic history of bipolar mood disorder. She was admitted with c/o a "nervous breakdown."  Patient also reported hearing voices commanding her to kill herself.  BRITNEE LIZAK was admitted for Schizoaffective disorder, bipolar type [facility]) and crisis management.  She was treated with  the following medications:  Seroquel XR 100 mg po qhs for psychosis/sleep.  Plan to taper off if she is more stable per outpatient provider, Abilify 5 mg po qam and 10 mg po qpm for psychosis /as well as to augment the effect of Seroquel.Abilify maintena IM 400 mg -given on 07/30/15, Restoril 15 mg po qhs for sleep, Trileptal 300 mg po bid for mood sx, Lamictal to 25 mg po daily and 50 mg po qpm for mood lability, PRN medications as per agitation protocol, Gabapentin 400 mg po bid for anxiety/pain, Zofran 4 mg po prn for nausea and Protonix to 40 mg po bid bc for GERD.  SHANIECE DRILL was discharged with current medication and was instructed on how to take medications as prescribed; (details listed below under Medication List).  Medical problems were identified and treated as needed.  Home medications were restarted as appropriate.  Improvement was monitored by observation and [name] daily report of symptom reduction.  Patient was less anxious, more alert and interacted with fellow patients.  Emotional and mental status was monitored by daily self-inventory reports completed by [name] and clinical staff.         LAMYA GEORGIADIS was evaluated by the treatment team for stability and plans for continued recovery upon discharge.  TYNECIA CORTIJO motivation was an integral factor for scheduling further treatment.  Employment, transportation, bed availability, health status, family support, and any pending legal issues were also considered during her hospital stay.  She was offered further treatment options upon discharge including but not limited to Residential, Intensive Outpatient, and Outpatient treatment.  LE INGS will follow up with the services as listed below under Follow Up Information.     Upon completion of this admission the CARSYN SALM was both mentally and medically stable for discharge denying suicidal/homicidal ideation, auditory/visual/tactile hallucinations improving, delusional  thoughts and paranoia.  Her sleep also improved.    Physical Findings: AIMS: Facial and Oral Movements Muscles of Facial Expression: None, normal Lips and Perioral Area: None, normal Jaw: None, normal Tongue: None, normal,Extremity Movements Upper (arms, wrists, hands, fingers): None, normal Lower (legs, knees, ankles, toes): None, normal, Trunk Movements Neck, shoulders, hips: None, normal, Overall Severity Severity of abnormal movements (highest score from questions above): None, normal Incapacitation due to abnormal movements: None, normal Patient's awareness of abnormal movements (rate only patient's report): No Awareness, Dental Status Current problems with teeth and/or dentures?: No Does patient usually wear dentures?: No  CIWA:    COWS:     Musculoskeletal: Strength & Muscle Tone: within normal limits Gait & Station: normal Patient leans: N/A  Psychiatric Specialty Exam:  SE MD SRA Review of Systems  All other systems reviewed and are negative.   Blood pressure 125/81, pulse 89, temperature 98.8 F (  37.1 C), temperature source Oral, resp. rate 18, height 5\' 4"  (1.626 m), weight 102.513 kg (226 lb), SpO2 100 %.Body mass index is 38.77 kg/(m^2).  Have you used any form of tobacco in the last 30 days? (Cigarettes, Smokeless Tobacco, Cigars, and/or Pipes): Yes  Has this patient used any form of tobacco in the last 30 days? (Cigarettes, Smokeless Tobacco, Cigars, and/or Pipes) Yes, Rx given  Blood Alcohol level:  Lab Results  Component Value Date   [facility] <5 07/18/2015   ETH <5 [id number]    Metabolic Disorder Labs:  Lab Results  Component Value Date   HGBA1C 5.7* 07/21/2015   MPG 117 07/21/2015   MPG 137 04/04/2015   Lab Results  Component Value Date   PROLACTIN 141.3* 07/21/2015   PROLACTIN 81.7* 04/04/2015   Lab Results  Component Value Date   CHOL 145 07/21/2015   TRIG 170* 07/21/2015   HDL 48 07/21/2015   CHOLHDL 3.0 07/21/2015   VLDL 34 07/21/2015    LDLCALC 63 07/21/2015   LDLCALC 73 04/04/2015    See Psychiatric Specialty Exam and Suicide Risk Assessment completed by Attending Physician prior to discharge.  Discharge destination:  Home  Is patient on multiple antipsychotic therapies at discharge:  Yes, Abilify and Seroquel Do you recommend tapering to monotherapy for antipsychotics?  per outpatient provider   Has Patient had three or more failed trials of antipsychotic monotherapy by history:  Yes,   Antipsychotic medications that previously failed include:   1.  has been on Haloperidol.  Recommended Plan for Multiple Antipsychotic Therapies: Taper to monotherapy as described:  per outpatient provider     Medication List    STOP taking these medications        albuterol 108 (90 Base) MCG/ACT inhaler  Commonly known as:  PROVENTIL HFA;VENTOLIN HFA     cetirizine 10 MG tablet  Commonly known as:  ZYRTEC     haloperidol 10 MG tablet  Commonly known as:  HALDOL     haloperidol 5 MG tablet  Commonly known as:  HALDOL     metoCLOPramide 10 MG tablet  Commonly known as:  REGLAN     multivitamin with minerals Tabs tablet     omeprazole 20 MG capsule  Commonly known as:  PRILOSEC  Replaced by:  pantoprazole 40 MG tablet     sitaGLIPtin 50 MG tablet  Commonly known as:  JANUVIA  Replaced by:  linagliptin 5 MG Tabs tablet     traZODone 150 MG tablet  Commonly known as:  DESYREL      TAKE these medications      Indication   amLODipine 5 MG tablet  Commonly known as:  NORVASC  Take 1 tablet (5 mg total) by mouth daily.   Indication:  High Blood Pressure     ARIPiprazole 5 MG tablet  Commonly known as:  ABILIFY  Take 1 tablet (5 mg) in the morning.  Take 2 tablets (10 mg) in the evening.   Indication:  mood stabilization     ARIPiprazole 400 MG Susr  Inject 400 mg into the muscle every 28 (twenty-eight) days. Last dose given 07/29/2014.  Next dose due 08/27/2015   Indication:  Schizophrenia     aspirin 81 MG  tablet  Take 1 tablet (81 mg total) by mouth daily.   Indication:  cardiac health     atenolol 100 MG tablet  Commonly known as:  TENORMIN  Take 1 tablet (100 mg total) by mouth daily.  Indication:  High Blood Pressure     atorvastatin 40 MG tablet  Commonly known as:  LIPITOR  Take 1 tablet (40 mg total) by mouth every evening.   Indication:  Elevation of Both Cholesterol and Triglycerides in Blood     baclofen 10 MG tablet  Commonly known as:  LIORESAL  Take 1 tablet (10 mg total) by mouth 3 (three) times daily.   Indication:  chronic pain     benztropine 0.5 MG tablet  Commonly known as:  COGENTIN  Take 1 tablet (0.5 mg) in the morning.  Take 1 tablet (0.5 mg) at night.   Indication:  Extrapyramidal Reaction caused by Medications     calcium carbonate 1250 (500 Ca) MG tablet  Commonly known as:  OS-CAL - dosed in mg of elemental calcium  Take 1 tablet (500 mg of elemental calcium total) by mouth daily with breakfast.   Indication:  Low Amount of Calcium in the Blood     celecoxib 100 MG capsule  Commonly known as:  CELEBREX  Take 1 capsule (100 mg total) by mouth 2 (two) times daily.   Indication:  chronic pain     folic acid 1 MG tablet  Commonly known as:  FOLVITE  Take 1 tablet (1 mg total) by mouth daily.   Indication:  Deficiency of Folic Acid in the Diet     furosemide 40 MG tablet  Commonly known as:  LASIX  Take 1 tablet (40 mg total) by mouth daily.   Indication:  High Blood Pressure     gabapentin 400 MG capsule  Commonly known as:  NEURONTIN  Take 1 capsule (400 mg total) by mouth 2 (two) times daily.   Indication:  mood stabilization and chronic pain     hydrochlorothiazide 25 MG tablet  Commonly known as:  HYDRODIURIL  Take 1 tablet (25 mg total) by mouth daily.   Indication:  High Blood Pressure     hydrOXYzine 25 MG tablet  Commonly known as:  ATARAX/VISTARIL  Take 1 tablet (25 mg total) by mouth every 6 (six) hours as needed for anxiety.    Indication:  Anxiety Neurosis     insulin aspart 100 UNIT/ML injection  Commonly known as:  novoLOG  Inject 0-15 Units into the skin 3 (three) times daily with meals. Use Sliding scale given to you by [facility] Nurse.  Resume home regimen   Indication:  Type 2 Diabetes     insulin glargine 100 UNIT/ML injection  Commonly known as:  LANTUS  Inject 0.3 mLs (30 Units total) into the skin at bedtime. To be managed by your outpatient provider.  Resume home regimen.   Indication:  Type 2 Diabetes     lamoTRIgine 25 MG tablet  Commonly known as:  LAMICTAL  Take one tab (25 mg) in the morning.  Then take 2 tabs (50 mg) in the evening.   Indication:  mood stabilization     linagliptin 5 MG Tabs tablet  Commonly known as:  TRADJENTA  Take 1 tablet (5 mg total) by mouth daily.   Indication:  Type 2 Diabetes     metFORMIN 1000 MG tablet  Commonly known as:  GLUCOPHAGE  Take 1 tablet (1,000 mg total) by mouth 2 (two) times daily with a meal.   Indication:  Type 2 Diabetes     naltrexone 50 MG tablet  Commonly known as:  DEPADE  Take 1 tablet (50 mg total) by mouth daily.   Indication:  mood  stabilization     nicotine 21 mg/24hr patch  Commonly known as:  NICODERM CQ - dosed in mg/24 hours  Place 1 patch (21 mg total) onto the skin daily.   Indication:  Nicotine Addiction     Oxcarbazepine 300 MG tablet  Commonly known as:  TRILEPTAL  Take 1 tablet (300 mg total) by mouth 2 (two) times daily.   Indication:  mood stabilization     pantoprazole 40 MG tablet  Commonly known as:  PROTONIX  Take 1 tablet (40 mg total) by mouth 2 (two) times daily before a meal.   Indication:  Gastroesophageal Reflux Disease     potassium chloride SA 20 MEQ tablet  Commonly known as:  K-DUR,KLOR-CON  Take 1 tablet (20 mEq total) by mouth daily.   Indication:  Low Amount of Potassium in the Blood     QUEtiapine 50 MG Tb24 24 hr tablet  Commonly known as:  SEROQUEL XR  Take 2 tablets (100 mg total) by  mouth daily at 8 pm.   Indication:  Mood control     temazepam 15 MG capsule  Commonly known as:  RESTORIL  Take 1 capsule (15 mg total) by mouth at bedtime.   Indication:  Trouble Sleeping           Follow-up Information    Follow up with [facility].   Why:  Call to find out when you can get another appointment with her   Contact information:   ATTN: Leslie Oneal [address], [place] [address] Phone: [phone] Fax: [phone]      Follow up with [facility] On 08/22/2015.   Why:  Wednesday at 1:00 with Ms Maia Plan information:    [address] [place] [address] Phone: [phone] Fax: [phone]      Follow-up recommendations:  Activity:  as tol Diet:  as tol  Comments:  1.  Take all your medications as prescribed.              2.  Report any adverse side effects to outpatient provider.                       3.  Patient instructed to not use alcohol or illegal drugs while on prescription medicines.            4.  In the event of worsening symptoms, instructed patient to call 911, the crisis hotline or go to nearest emergency room for evaluation of symptoms.  Signed: Janett Labella, NP [facility] 08/02/2015, 2:52 PM

## 2015-08-02 NOTE — Progress Notes (Signed)
  [facility] Adult Case Management Discharge Plan :  Will you be returning to the same living situation after discharge:  Yes,  with ex At discharge, do you have transportation home?: Yes,  bus pass Do you have the ability to pay for your medications: Yes,  mental health  Release of information consent forms completed and in the chart;  Patient's signature needed at discharge.  Patient to Follow up at: Follow-up Information    Follow up with [facility].   Why:  Call to find out when you can get another appointment with her   Contact information:   ATTN: Leslie Oneal [address] [place] [address] Phone: [phone] Fax: [phone]      Follow up with [facility] On 08/22/2015.   Why:  Wednesday at 1:00 with Ms Maia Plan information:    [address], [place] [address] Phone: [phone] Fax: [phone]      Next level of care provider has access to [place]: No  Safety Planning and Suicide Prevention discussed: Yes,  yes  Have you used any form of tobacco in the last 30 days? (Cigarettes, Smokeless Tobacco, Cigars, and/or Pipes): Yes  Has patient been referred to the Quitline?: Yes, faxed on 08/02/15  Patient has been referred for addiction treatment: Yes  [name] 08/02/2015, 9:57 AM

## 2015-08-08 ENCOUNTER — Encounter (HOSPITAL_COMMUNITY): Payer: Self-pay

## 2015-08-08 ENCOUNTER — Emergency Department (HOSPITAL_COMMUNITY)
Admission: EM | Admit: 2015-08-08 | Discharge: 2015-08-09 | Disposition: A | Discharge status: Other Acute Inpatient Hospital | Payer: No Typology Code available for payment source | Attending: Emergency Medicine | Admitting: Emergency Medicine

## 2015-08-08 DIAGNOSIS — F1410 Cocaine abuse, uncomplicated: Secondary | ICD-10-CM | POA: Insufficient documentation

## 2015-08-08 DIAGNOSIS — Z791 Long term (current) use of non-steroidal anti-inflammatories (NSAID): Secondary | ICD-10-CM | POA: Insufficient documentation

## 2015-08-08 DIAGNOSIS — I10 Essential (primary) hypertension: Secondary | ICD-10-CM | POA: Insufficient documentation

## 2015-08-08 DIAGNOSIS — Z7984 Long term (current) use of oral hypoglycemic drugs: Secondary | ICD-10-CM | POA: Insufficient documentation

## 2015-08-08 DIAGNOSIS — E7800 Pure hypercholesterolemia, unspecified: Secondary | ICD-10-CM | POA: Insufficient documentation

## 2015-08-08 DIAGNOSIS — F3130 Bipolar disorder, current episode depressed, mild or moderate severity, unspecified: Secondary | ICD-10-CM | POA: Insufficient documentation

## 2015-08-08 DIAGNOSIS — M109 Gout, unspecified: Secondary | ICD-10-CM | POA: Insufficient documentation

## 2015-08-08 DIAGNOSIS — Z8742 Personal history of other diseases of the female genital tract: Secondary | ICD-10-CM | POA: Insufficient documentation

## 2015-08-08 DIAGNOSIS — M158 Other polyosteoarthritis: Secondary | ICD-10-CM | POA: Insufficient documentation

## 2015-08-08 DIAGNOSIS — R440 Auditory hallucinations: Secondary | ICD-10-CM | POA: Insufficient documentation

## 2015-08-08 DIAGNOSIS — F17210 Nicotine dependence, cigarettes, uncomplicated: Secondary | ICD-10-CM | POA: Insufficient documentation

## 2015-08-08 DIAGNOSIS — R45851 Suicidal ideations: Secondary | ICD-10-CM

## 2015-08-08 DIAGNOSIS — Z7982 Long term (current) use of aspirin: Secondary | ICD-10-CM | POA: Insufficient documentation

## 2015-08-08 DIAGNOSIS — G43909 Migraine, unspecified, not intractable, without status migrainosus: Secondary | ICD-10-CM | POA: Insufficient documentation

## 2015-08-08 DIAGNOSIS — K219 Gastro-esophageal reflux disease without esophagitis: Secondary | ICD-10-CM | POA: Insufficient documentation

## 2015-08-08 DIAGNOSIS — Z794 Long term (current) use of insulin: Secondary | ICD-10-CM | POA: Insufficient documentation

## 2015-08-08 DIAGNOSIS — J45909 Unspecified asthma, uncomplicated: Secondary | ICD-10-CM | POA: Insufficient documentation

## 2015-08-08 DIAGNOSIS — I509 Heart failure, unspecified: Secondary | ICD-10-CM | POA: Insufficient documentation

## 2015-08-08 DIAGNOSIS — F419 Anxiety disorder, unspecified: Secondary | ICD-10-CM | POA: Insufficient documentation

## 2015-08-08 DIAGNOSIS — F1420 Cocaine dependence, uncomplicated: Secondary | ICD-10-CM | POA: Diagnosis present

## 2015-08-08 DIAGNOSIS — Z880 Allergy status to penicillin: Secondary | ICD-10-CM | POA: Insufficient documentation

## 2015-08-08 DIAGNOSIS — Z79899 Other long term (current) drug therapy: Secondary | ICD-10-CM | POA: Insufficient documentation

## 2015-08-08 DIAGNOSIS — E119 Type 2 diabetes mellitus without complications: Secondary | ICD-10-CM | POA: Insufficient documentation

## 2015-08-08 DIAGNOSIS — F1020 Alcohol dependence, uncomplicated: Secondary | ICD-10-CM | POA: Diagnosis present

## 2015-08-08 DIAGNOSIS — F250 Schizoaffective disorder, bipolar type: Secondary | ICD-10-CM | POA: Diagnosis present

## 2015-08-08 LAB — CBC
HEMATOCRIT: 32.7 % — AB (ref 36.0–46.0)
Hemoglobin: 10.4 g/dL — ABNORMAL LOW (ref 12.0–15.0)
MCH: 28.6 pg (ref 26.0–34.0)
MCHC: 31.8 g/dL (ref 30.0–36.0)
MCV: 89.8 fL (ref 78.0–100.0)
PLATELETS: 263 10*3/uL (ref 150–400)
RBC: 3.64 MIL/uL — AB (ref 3.87–5.11)
RDW: 13 % (ref 11.5–15.5)
WBC: 7.9 10*3/uL (ref 4.0–10.5)

## 2015-08-08 LAB — RAPID URINE DRUG SCREEN, HOSP PERFORMED
AMPHETAMINES: NOT DETECTED
BARBITURATES: NOT DETECTED
BENZODIAZEPINES: NOT DETECTED
Cocaine: POSITIVE — AB
Opiates: NOT DETECTED
TETRAHYDROCANNABINOL: NOT DETECTED

## 2015-08-08 LAB — COMPREHENSIVE METABOLIC PANEL
ALBUMIN: 3.9 g/dL (ref 3.5–5.0)
ALT: 13 U/L — AB (ref 14–54)
AST: 20 U/L (ref 15–41)
Alkaline Phosphatase: 69 U/L (ref 38–126)
Anion gap: 11 (ref 5–15)
BILIRUBIN TOTAL: 0.5 mg/dL (ref 0.3–1.2)
BUN: 21 mg/dL — AB (ref 6–20)
CHLORIDE: 106 mmol/L (ref 101–111)
CO2: 23 mmol/L (ref 22–32)
CREATININE: 1.24 mg/dL — AB (ref 0.44–1.00)
Calcium: 9 mg/dL (ref 8.9–10.3)
GFR calc Af Amer: 58 mL/min — ABNORMAL LOW (ref >60)
GFR, EST NON AFRICAN AMERICAN: 50 mL/min — AB (ref >60)
GLUCOSE: 95 mg/dL (ref 65–99)
Potassium: 3.9 mmol/L (ref 3.5–5.1)
Sodium: 140 mmol/L (ref 135–145)
Total Protein: 7.3 g/dL (ref 6.5–8.1)

## 2015-08-08 LAB — CBG MONITORING, ED
Glucose-Capillary: 73 mg/dL (ref 65–99)
Glucose-Capillary: 179 mg/dL — ABNORMAL HIGH (ref 65–99)

## 2015-08-08 LAB — ACETAMINOPHEN LEVEL: Acetaminophen (Tylenol), Serum: <10 ug/mL — ABNORMAL LOW (ref 10–30)

## 2015-08-08 LAB — SALICYLATE LEVEL: Salicylate Lvl: <4 mg/dL (ref 2.8–30.0)

## 2015-08-08 LAB — ETHANOL

## 2015-08-08 MED ORDER — INSULIN ASPART 100 UNIT/ML ~~LOC~~ SOLN
0.0000 [IU] | Freq: Three times a day (TID) | SUBCUTANEOUS | Status: DC
  Administered 2015-08-09: 1 [IU] via SUBCUTANEOUS

## 2015-08-08 MED ORDER — INSULIN ASPART 100 UNIT/ML ~~LOC~~ SOLN: 0.0000 [IU] | Freq: Every day | SUBCUTANEOUS | Status: DC

## 2015-08-08 NOTE — Progress Notes (Addendum)
Per Dr. Lovena Le, - patient meets criteria for inpatient hospitalization.  Patient has been referred to the following hospitals: [facility] - per Manuela Schwartz, send referral. [facility] - per intake, accepting referrals. [facility] - left a voicemail. [facility] - per [place], accepting referrals. [facility] - left voicemail inquiring about bed status.  At capacity: [facility], [facility], and [facility] - are not accepting adult medicaid, therefore patient could not be referred to these facilities.  Verlon Setting, [place] Disposition staff 08/08/2015 6:08 PM
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
## 2015-08-08 NOTE — ED Notes (Signed)
Pt and 2- belonging bags taken to [facility].
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
## 2015-08-08 NOTE — BH Assessment (Signed)
Per Dr. Lovena Le, - patient meets criteria for inpatient hospitalization.  TTS will seek placement.

## 2015-08-08 NOTE — ED Notes (Signed)
Pt states suicidal thoughts x 2 days.  Plan to cut self.  Hx of same.  Also hearing voices

## 2015-08-08 NOTE — ED Notes (Signed)
Patient in bed resting at the beginning of the shift. Her mood and affects sad and depressed. Patient reported having suicide thoughts. She said the voices are telling her hurt herself; but she has no plan of hurting herself. She also reported that she is able to tune the voices off sometimes. Writer encouraged and supported patient. Encouraged patient to communicate needs to staff. Q 15 minute check continues as ordered for safety.

## 2015-08-08 NOTE — ED Notes (Signed)
Bed: [facility] Expected date:  Expected time:  Means of arrival:  Comments: Triage 35

## 2015-08-09 ENCOUNTER — Inpatient Hospital Stay
Admission: EM | Admit: 2015-08-09 | Discharge: 2015-08-15 | DRG: 885 | Disposition: A | Discharge status: Home / Self Care | Payer: No Typology Code available for payment source | Source: Intra-hospital | Attending: Psychiatry | Admitting: Psychiatry

## 2015-08-09 DIAGNOSIS — Z794 Long term (current) use of insulin: Secondary | ICD-10-CM

## 2015-08-09 DIAGNOSIS — Z8673 Personal history of transient ischemic attack (TIA), and cerebral infarction without residual deficits: Secondary | ICD-10-CM | POA: Diagnosis not present

## 2015-08-09 DIAGNOSIS — Z79899 Other long term (current) drug therapy: Secondary | ICD-10-CM | POA: Diagnosis not present

## 2015-08-09 DIAGNOSIS — Z880 Allergy status to penicillin: Secondary | ICD-10-CM

## 2015-08-09 DIAGNOSIS — E119 Type 2 diabetes mellitus without complications: Secondary | ICD-10-CM

## 2015-08-09 DIAGNOSIS — F1020 Alcohol dependence, uncomplicated: Secondary | ICD-10-CM | POA: Diagnosis present

## 2015-08-09 DIAGNOSIS — J45909 Unspecified asthma, uncomplicated: Secondary | ICD-10-CM | POA: Diagnosis present

## 2015-08-09 DIAGNOSIS — Z9889 Other specified postprocedural states: Secondary | ICD-10-CM | POA: Diagnosis not present

## 2015-08-09 DIAGNOSIS — F410 Panic disorder [episodic paroxysmal anxiety] without agoraphobia: Secondary | ICD-10-CM | POA: Diagnosis present

## 2015-08-09 DIAGNOSIS — Z7982 Long term (current) use of aspirin: Secondary | ICD-10-CM | POA: Diagnosis not present

## 2015-08-09 DIAGNOSIS — F250 Schizoaffective disorder, bipolar type: Secondary | ICD-10-CM

## 2015-08-09 DIAGNOSIS — K219 Gastro-esophageal reflux disease without esophagitis: Secondary | ICD-10-CM | POA: Diagnosis present

## 2015-08-09 DIAGNOSIS — F17210 Nicotine dependence, cigarettes, uncomplicated: Secondary | ICD-10-CM | POA: Diagnosis present

## 2015-08-09 DIAGNOSIS — Z833 Family history of diabetes mellitus: Secondary | ICD-10-CM

## 2015-08-09 DIAGNOSIS — I509 Heart failure, unspecified: Secondary | ICD-10-CM | POA: Diagnosis present

## 2015-08-09 DIAGNOSIS — Z811 Family history of alcohol abuse and dependence: Secondary | ICD-10-CM | POA: Diagnosis not present

## 2015-08-09 DIAGNOSIS — F1410 Cocaine abuse, uncomplicated: Secondary | ICD-10-CM | POA: Diagnosis present

## 2015-08-09 DIAGNOSIS — I110 Hypertensive heart disease with heart failure: Secondary | ICD-10-CM | POA: Diagnosis present

## 2015-08-09 DIAGNOSIS — F1420 Cocaine dependence, uncomplicated: Secondary | ICD-10-CM | POA: Diagnosis present

## 2015-08-09 DIAGNOSIS — Z8711 Personal history of peptic ulcer disease: Secondary | ICD-10-CM | POA: Diagnosis not present

## 2015-08-09 DIAGNOSIS — Z803 Family history of malignant neoplasm of breast: Secondary | ICD-10-CM | POA: Diagnosis not present

## 2015-08-09 DIAGNOSIS — R45851 Suicidal ideations: Secondary | ICD-10-CM

## 2015-08-09 DIAGNOSIS — Z818 Family history of other mental and behavioral disorders: Secondary | ICD-10-CM

## 2015-08-09 DIAGNOSIS — F17200 Nicotine dependence, unspecified, uncomplicated: Secondary | ICD-10-CM | POA: Diagnosis present

## 2015-08-09 DIAGNOSIS — G4700 Insomnia, unspecified: Secondary | ICD-10-CM | POA: Diagnosis present

## 2015-08-09 DIAGNOSIS — Z590 Homelessness: Secondary | ICD-10-CM | POA: Diagnosis not present

## 2015-08-09 DIAGNOSIS — Z9119 Patient's noncompliance with other medical treatment and regimen: Secondary | ICD-10-CM | POA: Diagnosis not present

## 2015-08-09 DIAGNOSIS — E1140 Type 2 diabetes mellitus with diabetic neuropathy, unspecified: Secondary | ICD-10-CM | POA: Diagnosis present

## 2015-08-09 DIAGNOSIS — M1990 Unspecified osteoarthritis, unspecified site: Secondary | ICD-10-CM | POA: Diagnosis present

## 2015-08-09 DIAGNOSIS — E785 Hyperlipidemia, unspecified: Secondary | ICD-10-CM | POA: Diagnosis present

## 2015-08-09 DIAGNOSIS — I10 Essential (primary) hypertension: Secondary | ICD-10-CM | POA: Diagnosis present

## 2015-08-09 LAB — CBG MONITORING, ED
GLUCOSE-CAPILLARY: 135 mg/dL — AB (ref 65–99)
Glucose-Capillary: 118 mg/dL — ABNORMAL HIGH (ref 65–99)
Glucose-Capillary: 117 mg/dL — ABNORMAL HIGH (ref 65–99)
Glucose-Capillary: 127 mg/dL — ABNORMAL HIGH (ref 65–99)

## 2015-08-09 MED ORDER — METFORMIN HCL 500 MG PO TABS
1000.0000 mg | ORAL_TABLET | Freq: Two times a day (BID) | ORAL | Status: DC
  Administered 2015-08-10 – 2015-08-15 (×11): 1000 mg via ORAL
  Filled 2015-08-09 (×11): qty 2

## 2015-08-09 MED ORDER — INSULIN ASPART 100 UNIT/ML ~~LOC~~ SOLN: 0.0000 [IU] | Freq: Three times a day (TID) | SUBCUTANEOUS | Status: DC

## 2015-08-09 MED ORDER — AMLODIPINE BESYLATE 5 MG PO TABS
5.0000 mg | ORAL_TABLET | Freq: Every day | ORAL | Status: DC
  Administered 2015-08-10 – 2015-08-15 (×5): 5 mg via ORAL
  Filled 2015-08-09 (×6): qty 1

## 2015-08-09 MED ORDER — CALCIUM CARBONATE 1250 (500 CA) MG PO TABS: 1.0000 | ORAL_TABLET | Freq: Every day | ORAL | Status: DC

## 2015-08-09 MED ORDER — FUROSEMIDE 40 MG PO TABS
40.0000 mg | ORAL_TABLET | Freq: Every day | ORAL | Status: DC
  Administered 2015-08-09: 40 mg via ORAL
  Filled 2015-08-09: qty 1

## 2015-08-09 MED ORDER — FUROSEMIDE 20 MG PO TABS
40.0000 mg | ORAL_TABLET | Freq: Every day | ORAL | Status: DC
  Administered 2015-08-10 – 2015-08-15 (×5): 40 mg via ORAL
  Filled 2015-08-09 (×6): qty 2

## 2015-08-09 MED ORDER — ASPIRIN 81 MG PO CHEW
81.0000 mg | CHEWABLE_TABLET | Freq: Every day | ORAL | Status: DC
  Administered 2015-08-10 – 2015-08-15 (×5): 81 mg via ORAL
  Filled 2015-08-09 (×6): qty 1

## 2015-08-09 MED ORDER — ALBUTEROL SULFATE HFA 108 (90 BASE) MCG/ACT IN AERS: 2.0000 | INHALATION_SPRAY | Freq: Four times a day (QID) | RESPIRATORY_TRACT | Status: DC | PRN

## 2015-08-09 MED ORDER — METFORMIN HCL 500 MG PO TABS
1000.0000 mg | ORAL_TABLET | Freq: Two times a day (BID) | ORAL | Status: DC
  Administered 2015-08-09: 1000 mg via ORAL
  Filled 2015-08-09 (×2): qty 2

## 2015-08-09 MED ORDER — LINAGLIPTIN 5 MG PO TABS
5.0000 mg | ORAL_TABLET | Freq: Every day | ORAL | Status: DC
  Administered 2015-08-09: 5 mg via ORAL
  Filled 2015-08-09: qty 1

## 2015-08-09 MED ORDER — ARIPIPRAZOLE 5 MG PO TABS
5.0000 mg | ORAL_TABLET | Freq: Every day | ORAL | Status: DC
  Administered 2015-08-09: 5 mg via ORAL
  Filled 2015-08-09: qty 1

## 2015-08-09 MED ORDER — CALCIUM CARBONATE 1250 (500 CA) MG PO TABS
1.0000 | ORAL_TABLET | Freq: Every day | ORAL | Status: DC
  Filled 2015-08-09: qty 1

## 2015-08-09 MED ORDER — GABAPENTIN 400 MG PO CAPS
400.0000 mg | ORAL_CAPSULE | Freq: Two times a day (BID) | ORAL | Status: DC
  Administered 2015-08-10 – 2015-08-15 (×10): 400 mg via ORAL
  Filled 2015-08-09 (×11): qty 1

## 2015-08-09 MED ORDER — ALUM & MAG HYDROXIDE-SIMETH 200-200-20 MG/5ML PO SUSP: 30.0000 mL | ORAL | Status: DC | PRN

## 2015-08-09 MED ORDER — OXCARBAZEPINE 300 MG PO TABS
300.0000 mg | ORAL_TABLET | Freq: Two times a day (BID) | ORAL | Status: DC
  Administered 2015-08-10 – 2015-08-15 (×10): 300 mg via ORAL
  Filled 2015-08-09 (×11): qty 1

## 2015-08-09 MED ORDER — HYDROXYZINE HCL 25 MG PO TABS
25.0000 mg | ORAL_TABLET | Freq: Four times a day (QID) | ORAL | Status: DC | PRN
  Administered 2015-08-09 (×2): 25 mg via ORAL
  Filled 2015-08-09: qty 1

## 2015-08-09 MED ORDER — PANTOPRAZOLE SODIUM 40 MG PO TBEC
40.0000 mg | DELAYED_RELEASE_TABLET | Freq: Two times a day (BID) | ORAL | Status: DC
  Administered 2015-08-09: 40 mg via ORAL
  Filled 2015-08-09: qty 1

## 2015-08-09 MED ORDER — ARIPIPRAZOLE 10 MG PO TABS
5.0000 mg | ORAL_TABLET | Freq: Every day | ORAL | Status: DC
  Administered 2015-08-10 – 2015-08-11 (×2): 5 mg via ORAL
  Filled 2015-08-09 (×2): qty 1

## 2015-08-09 MED ORDER — CELECOXIB 100 MG PO CAPS
100.0000 mg | ORAL_CAPSULE | Freq: Two times a day (BID) | ORAL | Status: DC
  Administered 2015-08-10 – 2015-08-14 (×8): 100 mg via ORAL
  Filled 2015-08-09 (×9): qty 1

## 2015-08-09 MED ORDER — CALCIUM CARBONATE ANTACID 500 MG PO CHEW
2.5000 | CHEWABLE_TABLET | Freq: Every day | ORAL | Status: DC
  Administered 2015-08-10 – 2015-08-15 (×6): 500 mg via ORAL
  Filled 2015-08-09: qty 2.5
  Filled 2015-08-09 (×4): qty 1
  Filled 2015-08-09: qty 2.5

## 2015-08-09 MED ORDER — MAGNESIUM HYDROXIDE 400 MG/5ML PO SUSP: 30.0000 mL | Freq: Every day | ORAL | Status: DC | PRN

## 2015-08-09 MED ORDER — LINAGLIPTIN 5 MG PO TABS
5.0000 mg | ORAL_TABLET | Freq: Every day | ORAL | Status: DC
  Administered 2015-08-10 – 2015-08-15 (×5): 5 mg via ORAL
  Filled 2015-08-09 (×6): qty 1

## 2015-08-09 MED ORDER — PANTOPRAZOLE SODIUM 40 MG PO TBEC
40.0000 mg | DELAYED_RELEASE_TABLET | Freq: Two times a day (BID) | ORAL | Status: DC
  Administered 2015-08-10 – 2015-08-15 (×11): 40 mg via ORAL
  Filled 2015-08-09 (×11): qty 1

## 2015-08-09 MED ORDER — AMLODIPINE BESYLATE 5 MG PO TABS
5.0000 mg | ORAL_TABLET | Freq: Every day | ORAL | Status: DC
  Administered 2015-08-09: 5 mg via ORAL
  Filled 2015-08-09: qty 1

## 2015-08-09 MED ORDER — GABAPENTIN 400 MG PO CAPS
400.0000 mg | ORAL_CAPSULE | Freq: Two times a day (BID) | ORAL | Status: DC
  Administered 2015-08-09 (×2): 400 mg via ORAL
  Filled 2015-08-09 (×2): qty 1

## 2015-08-09 MED ORDER — LAMOTRIGINE 25 MG PO TABS
25.0000 mg | ORAL_TABLET | Freq: Every day | ORAL | Status: DC
  Administered 2015-08-09: 25 mg via ORAL
  Filled 2015-08-09: qty 1

## 2015-08-09 MED ORDER — INSULIN ASPART 100 UNIT/ML ~~LOC~~ SOLN: 0.0000 [IU] | Freq: Every day | SUBCUTANEOUS | Status: DC

## 2015-08-09 MED ORDER — HYDROXYZINE HCL 25 MG PO TABS
25.0000 mg | ORAL_TABLET | Freq: Four times a day (QID) | ORAL | Status: DC | PRN
  Administered 2015-08-11 – 2015-08-13 (×5): 25 mg via ORAL
  Filled 2015-08-09 (×5): qty 1

## 2015-08-09 MED ORDER — OXCARBAZEPINE 300 MG PO TABS
300.0000 mg | ORAL_TABLET | Freq: Two times a day (BID) | ORAL | Status: DC
  Administered 2015-08-09 (×2): 300 mg via ORAL
  Filled 2015-08-09 (×2): qty 1

## 2015-08-09 MED ORDER — QUETIAPINE FUMARATE ER 50 MG PO TB24
100.0000 mg | ORAL_TABLET | Freq: Every day | ORAL | Status: DC
  Administered 2015-08-10: 100 mg via ORAL
  Filled 2015-08-09 (×2): qty 2

## 2015-08-09 MED ORDER — ASPIRIN 81 MG PO CHEW
81.0000 mg | CHEWABLE_TABLET | Freq: Every day | ORAL | Status: DC
  Administered 2015-08-09: 81 mg via ORAL
  Filled 2015-08-09: qty 1

## 2015-08-09 MED ORDER — HYDROXYZINE HCL 25 MG PO TABS
25.0000 mg | ORAL_TABLET | Freq: Four times a day (QID) | ORAL | Status: DC | PRN
  Filled 2015-08-09: qty 1

## 2015-08-09 MED ORDER — ATORVASTATIN CALCIUM 20 MG PO TABS
40.0000 mg | ORAL_TABLET | Freq: Every evening | ORAL | Status: DC
  Administered 2015-08-10 – 2015-08-14 (×5): 40 mg via ORAL
  Filled 2015-08-09 (×5): qty 2

## 2015-08-09 MED ORDER — QUETIAPINE FUMARATE ER 50 MG PO TB24
100.0000 mg | ORAL_TABLET | Freq: Every day | ORAL | Status: DC
  Administered 2015-08-09: 100 mg via ORAL
  Filled 2015-08-09: qty 2

## 2015-08-09 MED ORDER — ACETAMINOPHEN 325 MG PO TABS: 650.0000 mg | ORAL_TABLET | Freq: Four times a day (QID) | ORAL | Status: DC | PRN

## 2015-08-09 MED ORDER — LAMOTRIGINE 25 MG PO TABS
25.0000 mg | ORAL_TABLET | Freq: Every day | ORAL | Status: DC
  Administered 2015-08-10 – 2015-08-14 (×4): 25 mg via ORAL
  Filled 2015-08-09 (×5): qty 1

## 2015-08-09 MED ORDER — CELECOXIB 100 MG PO CAPS
100.0000 mg | ORAL_CAPSULE | Freq: Two times a day (BID) | ORAL | Status: DC
  Administered 2015-08-09 (×2): 100 mg via ORAL
  Filled 2015-08-09 (×2): qty 1

## 2015-08-09 MED ORDER — ATORVASTATIN CALCIUM 40 MG PO TABS
40.0000 mg | ORAL_TABLET | Freq: Every evening | ORAL | Status: DC
  Administered 2015-08-09: 40 mg via ORAL
  Filled 2015-08-09: qty 1

## 2015-08-09 NOTE — ED Notes (Signed)
Patient resting quietly with eyes closed. Respirations even and unlabored. No distress noted. Q 15 minute checks continues for safety.

## 2015-08-09 NOTE — BH Assessment (Signed)
[facility] Assessment Progress Note  Per Donnelly Angelica, MD, this pt requires psychiatric hospitalization at this time.  At 14:45 [place], the charge nurse at [facility], reports that pt has been accepted to their facility by Dr Bary Leriche.  Dr Lovena Le concurs with this decision.  Pt has signed Voluntary Admission and Consent for Treatment, as well as Consent to Release Information to [facility] [place], her outpatient provider, and a notification call has been placed.  Signed forms have been faxed to [facility] at [phone].  Pt's nurse, Narda Rutherford, has been notified, and agrees to send original paperwork along with pt via [facility], and to call report to [phone].  Jalene Mullet, [place] Triage Specialist [phone]

## 2015-08-09 NOTE — ED Notes (Signed)
Dr Lovena Le and Heloise Purpura DNP into see

## 2015-08-09 NOTE — ED Notes (Signed)
TTS into talk w/ pt 

## 2015-08-09 NOTE — Progress Notes (Signed)
Pt admitted to unit from [facility]. Pt is alert and oriented x4. Pt affect is anxious and depressed. She rates depression as a 9 out of 10 and anxiety an 8 out of 10. Pt reports that she was recently kicked out of her boyfriend's house after having an argument with him. She is now homeless. Pt denies SI/HI/AVH at this time, although she states that she occasionally hears voices "telling me to hurt myself and others." Pt denies hearing these voices at this time. Denies pain. Pt reports a history of DM and peripheral neuropathy, states that she has slight sensation on her feet. Pt reports every day alcohol use. Pt is fidgety throughout assessment and is seen picking and rubbing at her neck. Skin assessment completed and no contraband found. Pt has a tattoo on her right upper arm and left upper arm. Scars are noted on her abdomen and arms from "picking and scratching at myself." Feet are noted to be dry. Pt oriented to unit. Sandwich tray offered. No concerns or complaints at this time. q15 minute safety checks maintained. Pt remains free from harm.

## 2015-08-09 NOTE — ED Notes (Signed)
Up tot he bathroom to shower and change scrubs 

## 2015-08-09 NOTE — ED Notes (Addendum)
On the phone, pt is aware that she will transport to [facility] later today

## 2015-08-09 NOTE — ED Notes (Signed)
Gwen at [facility] called and bed is still not available --they will call back when it is ready

## 2015-08-09 NOTE — Consult Note (Signed)
[place] [facility] Consult   Reason for Consult:  Depression, suicidal ideation Referring Physician:  EDP Patient Identification: Maureen Swanson MRN:  [id number] Principal Diagnosis: Schizoaffective disorder, bipolar type [facility]) Diagnosis:   Patient Active Problem List   Diagnosis Date Noted  . Schizoaffective disorder, bipolar type ([place]) [F25.0] 04/03/2015    Priority: High  . Cocaine use disorder, moderate, dependence ([place]) [F14.20] 04/03/2015    Priority: Medium  . Alcohol use disorder, moderate, dependence ([place]) [F10.20] 04/03/2015    Priority: Medium  . Tobacco use disorder [F17.200] 07/20/2015  . Diabetes mellitus ([place]) [E11.9] 07/20/2015  . Suicidal ideation [R45.851]   . Diabetes ([place]) [E11.9] 04/03/2015  . Conversion disorder [F44.9] 12/09/2014  . Acute ischemic stroke ([place]) [I63.9] 12/08/2014  . Left-sided weakness [M62.89] 12/08/2014  . 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 Knee pain [M25.569] 10/17/2010  . HYPERGLYCEMIA [R73.09] 08/16/2010  . MAMMOGRAM, ABNORMAL, RIGHT [R92.8] 08/05/2010  . HYPERLIPIDEMIA [E78.5] 06/21/2010  . AMENORRHEA, SECONDARY [N91.2] 10/29/2009  . 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 HELICOBACTER PYLORI INFECTION [A04.8] 04/02/2009  . GERD [K21.9] 10/11/2007  . Essential hypertension [I10] 02/26/2007    Total Time spent with patient: 45 minutes  Subjective:   Maureen Swanson is a 50 y.o. female patient admitted with depression and suicidal ideation. Pt seen and chart reviewed this AM with NP/MD team. Pt seen and chart reviewed. Pt is alert/oriented x4, calm, cooperative, and appropriate to situation. Pt denieshomicidal ideation and psychosis and does not appear to be responding to internal stimuli. Continues to affirm suicidal ideation, trying crack recently although by history, pt has been positive for cocaine before. Pt reports that she has numerous stressors including recently homelessness being kicked out by her  boyfriend, diabetes, arthritis, and financial concerns having applied for SSI but awaiting approval. Will seek inpatient placement.    HPI:  I have reviewed and concur with HPI elements from ED, as modified below: Patient is a 50 year old African American female that reports suicidal ideation with a plan to stab herself in the stomach or overdose on her psychiatric medication. Patient reports hearing voices commanding her to kill herself. Patient reports that she is not able to contract for safety. The patient reports a history of suicide attempts.Patient reports increased depression after being discharged from [facility] in February 2017. Patient reports that the voices have gotten louder, patient reports non-compliance with taking her psychiatric medication, patient reports that her ex-boyfriend wants her to sleep with other men. Patient reports that she receives medication management from [facility] but she has not been there since being discharged from [facility]. Patient reports prior detox and substance abuse treatment inpatient hospitalizations. Patient reports that her last use of cocaine two days ago. Patient reports that she has been using cocaine since the age of 49. Patient denies withdrawal symptoms. Patient denies a history of seizures. PatientUDSis positive for cocaine and BAL <5. Patient reports treatment for substance abuse at [facility] but she is not able to remember the year in which she received services.   Reviewed HPI and previous notes and pt is known to this provider as well as Dr. Lovena Le. See evaluation above.   Past Psychiatric History:  Depression, Alcohol use disorder, Cocaine use disorder  Risk to Self: Suicidal Ideation: Yes-Currently Present Suicidal Intent: Yes-Currently Present Is patient at risk for suicide?: Yes Suicidal Plan?: Yes-Currently Present Specify Current Suicidal Plan: Overdose on medication adn to stab herself Access to Means: Yes Specify Access to Suicidal  Means: Medication What has been your use of drugs/alcohol within the last 12 months?: Beer and Cocaine How many times?: 3 Other Self Harm Risks: None Reported Triggers for Past Attempts: Hallucinations Intentional Self Injurious Behavior: None Risk to Others: Homicidal Ideation: Yes-Currently Present Thoughts of Harm to Others: No Current Homicidal Intent: No Current Homicidal Plan: No Access to Homicidal Means: No Identified Victim: None Reported History of harm to others?: No Assessment of Violence: None Noted Violent Behavior Description: n Does patient have access to weapons?: No Criminal Charges Pending?: No Does patient have a court date: No Prior Inpatient Therapy: Prior Inpatient Therapy: Yes Prior Therapy Dates: Feb 2017 Prior Therapy Facilty/Provider(s): [facility] Reason for Treatment: SI and Psychosis Prior Outpatient Therapy: Prior Outpatient Therapy: Yes Prior Therapy Dates: Onging  Prior Therapy Facilty/Provider(s): [facility]  Reason for Treatment: Medication Management Does patient have an ACCT team?: No Does patient have Intensive In-House Services?  : No Does patient have [facility] services? : No Does patient have P4CC services?: No  Past Medical History:  Past Medical History  Diagnosis Date  . High cholesterol   . Depression   . Gout   . Anxiety   . Bipolar 1 disorder ([place])   . GERD (gastroesophageal reflux disease)   . Substance abuse     crack cocaine  . Headache(784.0)     migraines  . TMJ (temporomandibular joint disorder)   . Asthma     daily and prn inhalers  . Arthritis     back and knees  . Gastric ulcer   . 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 Hypertension     under control, has been on med. x 12 yrs.  . Diabetes mellitus     diet-controlled  . CHF (congestive heart failure) [facility])     Past Surgical History  Procedure Laterality Date  . Knee arthroscopy w/ partial medial meniscectomy  05/01/2010    right  . Breast  lumpectomy with needle localization  04/19/2012    Procedure: BREAST LUMPECTOMY WITH NEEDLE LOCALIZATION;  Surgeon: [name], MD;  Location: [facility];  Service: General;  Laterality: Right;   Family History:  Family History  Problem Relation Age of Onset  . Diabetes Mother   . Breast cancer Mother   . Cancer Father   . Bipolar disorder Maternal Aunt   . Schizophrenia Maternal Grandfather   . Alcoholism Maternal Uncle    Family Psychiatric  History: Unknown Social History:  History  Alcohol Use  . 0.0 oz/week    Comment: reports she drinks 3 40 ounce beers daily     History  Drug Use No    Comment: last use 1/30 (07/18/15)    Social History   Social History  . Marital Status: Single    Spouse Name: N/A  . Number of Children: N/A  . Years of Education: N/A   Social History Main Topics  . Smoking status: Current Every Day Smoker -- 0.50 packs/day for 15 years    Types: Cigarettes    Last Attempt to Quit: 08/02/2012  . Smokeless tobacco: Never Used  . Alcohol Use: 0.0 oz/week     Comment: reports she drinks 3 40 ounce beers daily  . Drug Use: No     Comment: last use 1/30 (07/18/15)  . Sexual Activity: Not Asked   Other Topics Concern  . None   Social History Narrative   Additional Social History:    Allergies:  Allergies  Allergen Reactions  . Chocolate Hives  . Orange Hives    "Acid foods"  . Penicillins Hives    Has patient had a PCN reaction causing immediate rash, facial/tongue/throat swelling, SOB or lightheadedness with hypotension: Yes Has patient had a PCN reaction causing severe rash involving mucus membranes or skin necrosis: Yes Has patient had a PCN reaction that required hospitalization No Has patient had a PCN reaction occurring within the last 10 years: No If all of the above answers are "NO", then may proceed with Cephalosporin use.   . Other Swelling    strawberries  . Tomato Hives    "acid foods"    Labs:   Results for orders placed or performed during the hospital encounter of 08/08/15 (from the past 48 hour(s))  Comprehensive metabolic panel     Status: Abnormal   Collection Time: 08/08/15 11:08 AM  Result Value Ref Range   Sodium 140 135 - 145 mmol/L   Potassium 3.9 3.5 - 5.1 mmol/L   Chloride 106 101 - 111 mmol/L   CO2 23 22 - 32 mmol/L   Glucose, Bld 95 65 - 99 mg/dL   BUN 21 (H) 6 - 20 mg/dL   Creatinine, Ser 1.24 (H) 0.44 - 1.00 mg/dL   Calcium 9.0 8.9 - 10.3 mg/dL   Total Protein 7.3 6.5 - 8.1 g/dL   Albumin 3.9 3.5 - 5.0 g/dL   AST 20 15 - 41 U/L   ALT 13 (L) 14 - 54 U/L   Alkaline Phosphatase 69 38 - 126 U/L   Total Bilirubin 0.5 0.3 - 1.2 mg/dL   GFR calc non Af Amer 50 (L) >60 mL/min   GFR calc Af Amer 58 (L) >60 mL/min    Comment: (NOTE) The eGFR has been calculated using the CKD EPI equation. This calculation has not been validated in all clinical situations. eGFR's persistently <60 mL/min signify possible Chronic Kidney Disease.    Anion gap 11 5 - 15  Ethanol (ETOH)     Status: None   Collection Time: 08/08/15 11:08 AM  Result Value Ref Range   Alcohol, Ethyl (B) <5 <5 mg/dL    Comment:        LOWEST DETECTABLE LIMIT FOR SERUM ALCOHOL IS 5 mg/dL FOR MEDICAL PURPOSES ONLY   Salicylate level     Status: None   Collection Time: 08/08/15 11:08 AM  Result Value Ref Range   Salicylate Lvl <3.2 2.8 - 30.0 mg/dL  Acetaminophen level     Status: Abnormal   Collection Time: 08/08/15 11:08 AM  Result Value Ref Range   Acetaminophen (Tylenol), Serum <10 (L) 10 - 30 ug/mL    Comment:        THERAPEUTIC CONCENTRATIONS VARY SIGNIFICANTLY. A RANGE OF 10-30 ug/mL MAY BE AN EFFECTIVE CONCENTRATION FOR MANY PATIENTS. HOWEVER, SOME ARE BEST TREATED AT CONCENTRATIONS OUTSIDE THIS RANGE. ACETAMINOPHEN CONCENTRATIONS >150 ug/mL AT 4 HOURS AFTER INGESTION AND >50 ug/mL AT 12 HOURS AFTER INGESTION ARE OFTEN ASSOCIATED WITH TOXIC REACTIONS.   CBC     Status: Abnormal    Collection Time: 08/08/15 11:08 AM  Result Value Ref Range   WBC 7.9 4.0 - 10.5 K/uL   RBC 3.64 (L) 3.87 - 5.11 MIL/uL   Hemoglobin 10.4 (L) 12.0 - 15.0 g/dL   HCT 32.7 (L) 36.0 - 46.0 %   MCV 89.8 78.0 - 100.0 fL   MCH 28.6 26.0 - 34.0 pg   MCHC 31.8 30.0 - 36.0 g/dL  RDW 13.0 11.5 - 15.5 %   Platelets 263 150 - 400 K/uL  Urine rapid drug screen (hosp performed) (Not at [facility])     Status: Abnormal   Collection Time: 08/08/15 11:09 AM  Result Value Ref Range   Opiates NONE DETECTED NONE DETECTED   Cocaine POSITIVE (A) NONE DETECTED   Benzodiazepines NONE DETECTED NONE DETECTED   Amphetamines NONE DETECTED NONE DETECTED   Tetrahydrocannabinol NONE DETECTED NONE DETECTED   Barbiturates NONE DETECTED NONE DETECTED    Comment:        DRUG SCREEN FOR MEDICAL PURPOSES ONLY.  IF CONFIRMATION IS NEEDED FOR ANY PURPOSE, NOTIFY LAB WITHIN 5 DAYS.        LOWEST DETECTABLE LIMITS FOR URINE DRUG SCREEN Drug Class       Cutoff (ng/mL) Amphetamine      1000 Barbiturate      200 Benzodiazepine   160 Tricyclics       737 Opiates          300 Cocaine          300 THC              50   CBG monitoring, ED     Status: None   Collection Time: 08/08/15  5:35 PM  Result Value Ref Range   Glucose-Capillary 73 65 - 99 mg/dL  CBG monitoring, ED     Status: Abnormal   Collection Time: 08/08/15  8:30 PM  Result Value Ref Range   Glucose-Capillary 179 (H) 65 - 99 mg/dL  CBG monitoring, ED     Status: Abnormal   Collection Time: 08/09/15  7:59 AM  Result Value Ref Range   Glucose-Capillary 135 (H) 65 - 99 mg/dL    Current Facility-Administered Medications  Medication Dose Route Frequency Provider Last Rate Last Dose  . albuterol (PROVENTIL HFA;VENTOLIN HFA) 108 (90 Base) MCG/ACT inhaler 2 puff  2 puff Inhalation Q6H PRN Benjamine Mola, FNP      . amLODipine (NORVASC) tablet 5 mg  5 mg Oral Daily Derrill Center, NP      . ARIPiprazole (ABILIFY) tablet 5 mg  5 mg Oral Daily Derrill Center,  NP      . aspirin tablet 81 mg  81 mg Oral Daily Benjamine Mola, FNP      . atorvastatin (LIPITOR) tablet 40 mg  40 mg Oral QPM Derrill Center, NP      . Derrill Memo ON 08/10/2015] calcium carbonate (OS-CAL - dosed in mg of elemental calcium) tablet 500 mg of elemental calcium  1 tablet Oral Q breakfast Derrill Center, NP      . celecoxib (CELEBREX) capsule 100 mg  100 mg Oral BID Derrill Center, NP      . furosemide (LASIX) tablet 40 mg  40 mg Oral Daily Derrill Center, NP      . gabapentin (NEURONTIN) capsule 400 mg  400 mg Oral BID Derrill Center, NP      . hydrOXYzine (ATARAX/VISTARIL) tablet 25 mg  25 mg Oral Q6H PRN Derrill Center, NP      . hydrOXYzine (ATARAX/VISTARIL) tablet 25 mg  25 mg Oral Q6H PRN Clarene Reamer, MD   25 mg at 08/09/15 1062  . insulin aspart (novoLOG) injection 0-15 Units  0-15 Units Subcutaneous TID WC Derrill Center, NP      . insulin aspart (novoLOG) injection 0-5 Units  0-5 Units Subcutaneous QHS Derrill Center, NP      .  insulin aspart (novoLOG) injection 0-9 Units  0-9 Units Subcutaneous TID WC Derrill Center, NP   1 Units at 08/09/15 [id number]  . lamoTRIgine (LAMICTAL) tablet 25 mg  25 mg Oral Daily Derrill Center, NP      . linagliptin (TRADJENTA) tablet 5 mg  5 mg Oral Daily Derrill Center, NP      . metFORMIN (GLUCOPHAGE) tablet 1,000 mg  1,000 mg Oral BID WC Derrill Center, NP      . Oxcarbazepine (TRILEPTAL) tablet 300 mg  300 mg Oral BID Derrill Center, NP      . pantoprazole (PROTONIX) EC tablet 40 mg  40 mg Oral BID AC Derrill Center, NP      . QUEtiapine (SEROQUEL XR) 24 hr tablet 100 mg  100 mg Oral Q2000 Derrill Center, NP       Current Outpatient Prescriptions  Medication Sig Dispense Refill  . albuterol (PROVENTIL HFA;VENTOLIN HFA) 108 (90 Base) MCG/ACT inhaler Inhale 2 puffs into the lungs every 6 (six) hours as needed for wheezing or shortness of breath.    Marland Kitchen amLODipine (NORVASC) 5 MG tablet Take 1 tablet (5 mg total) by mouth daily. 30 tablet 0  .  ARIPiprazole (ABILIFY) 5 MG tablet Take 1 tablet (5 mg) in the morning.  Take 2 tablets (10 mg) in the evening. 90 tablet 0  . ARIPiprazole 400 MG SUSR Inject 400 mg into the muscle every 28 (twenty-eight) days. Last dose given 07/29/2014.  Next dose due 08/27/2015 1 each 0  . aspirin 81 MG tablet Take 1 tablet (81 mg total) by mouth daily. 30 tablet 0  . atenolol (TENORMIN) 100 MG tablet Take 1 tablet (100 mg total) by mouth daily. 30 tablet 0  . atorvastatin (LIPITOR) 40 MG tablet Take 1 tablet (40 mg total) by mouth every evening. 30 tablet 0  . baclofen (LIORESAL) 10 MG tablet Take 1 tablet (10 mg total) by mouth 3 (three) times daily. (Patient taking differently: Take 10 mg by mouth 3 (three) times daily as needed for muscle spasms. ) 30 each 0  . calcium carbonate (OS-CAL - DOSED IN MG OF ELEMENTAL CALCIUM) 1250 (500 Ca) MG tablet Take 1 tablet (500 mg of elemental calcium total) by mouth daily with breakfast. 30 tablet 0  . celecoxib (CELEBREX) 100 MG capsule Take 1 capsule (100 mg total) by mouth 2 (two) times daily. 60 capsule 0  . furosemide (LASIX) 40 MG tablet Take 1 tablet (40 mg total) by mouth daily. 30 tablet 0  . gabapentin (NEURONTIN) 400 MG capsule Take 1 capsule (400 mg total) by mouth 2 (two) times daily. 60 capsule 0  . hydrOXYzine (ATARAX/VISTARIL) 25 MG tablet Take 1 tablet (25 mg total) by mouth every 6 (six) hours as needed for anxiety. 30 tablet 0  . insulin aspart (NOVOLOG) 100 UNIT/ML injection Inject 0-15 Units into the skin 3 (three) times daily with meals. Use Sliding scale given to you by [facility] Nurse.  Resume home regimen 10 mL 0  . insulin glargine (LANTUS) 100 UNIT/ML injection Inject 0.3 mLs (30 Units total) into the skin at bedtime. To be managed by your outpatient provider.  Resume home regimen. 10 mL 0  . lamoTRIgine (LAMICTAL) 25 MG tablet Take one tab (25 mg) in the morning.  Then take 2 tabs (50 mg) in the evening. 30 tablet 0  . linagliptin (TRADJENTA) 5 MG TABS  tablet Take 1 tablet (5 mg total) by mouth daily.  30 tablet 0  . metFORMIN (GLUCOPHAGE) 1000 MG tablet Take 1 tablet (1,000 mg total) by mouth 2 (two) times daily with a meal. 60 tablet 0  . naltrexone (DEPADE) 50 MG tablet Take 1 tablet (50 mg total) by mouth daily. 30 tablet 0  . Oxcarbazepine (TRILEPTAL) 300 MG tablet Take 1 tablet (300 mg total) by mouth 2 (two) times daily. 60 tablet 0  . pantoprazole (PROTONIX) 40 MG tablet Take 1 tablet (40 mg total) by mouth 2 (two) times daily before a meal. 60 tablet 0  . potassium chloride SA (K-DUR,KLOR-CON) 20 MEQ tablet Take 1 tablet (20 mEq total) by mouth daily. 30 tablet 0  . QUEtiapine (SEROQUEL XR) 50 MG TB24 24 hr tablet Take 2 tablets (100 mg total) by mouth daily at 8 pm. 60 tablet 0  . temazepam (RESTORIL) 15 MG capsule Take 1 capsule (15 mg total) by mouth at bedtime. 30 capsule 0  . benztropine (COGENTIN) 0.5 MG tablet Take 1 tablet (0.5 mg) in the morning.  Take 1 tablet (0.5 mg) at night. (Patient not taking: Reported on 08/08/2015) 60 tablet 0  . folic acid (FOLVITE) 1 MG tablet Take 1 tablet (1 mg total) by mouth daily. (Patient not taking: Reported on 08/08/2015) 30 tablet 0  . hydrochlorothiazide (HYDRODIURIL) 25 MG tablet Take 1 tablet (25 mg total) by mouth daily. (Patient not taking: Reported on 08/08/2015) 30 tablet 0    Musculoskeletal: Strength & Muscle Tone: within normal limits Gait & Station: normal Patient leans: N/A  Psychiatric Specialty Exam: Review of Systems  Constitutional: Negative.   HENT: Negative.   Eyes: Negative.   Respiratory: Negative.   Cardiovascular: Negative.   Gastrointestinal: Negative.   Genitourinary: Negative.   Musculoskeletal: Negative.   Skin: Negative.   Neurological: Negative.   Endo/Heme/Allergies: Negative.   Psychiatric/Behavioral: Positive for depression, suicidal ideas and substance abuse. The patient is nervous/anxious and has insomnia (but was tired when awake for greater than 1  day, no manic symptoms endorsed).   All other systems reviewed and are negative. See PMH as documented, stable at this time  Blood pressure 139/73, pulse 73, temperature 97.9 F (36.6 C), temperature source Oral, resp. rate 18, SpO2 98 %.There is no weight on file to calculate BMI.  General Appearance: Casual and Fairly Groomed  Eye Contact::  Good  Speech:  Clear and Coherent and Normal Rate  Volume:  Normal  Mood:  Anxious and Depressed  Affect:  Congruent and Depressed  Thought Process:  Coherent, Goal Directed and Intact  Orientation:  Full (Time, Place, and Person)  Thought Content:  symptoms, worries, concerns, rumination about housing  Suicidal Thoughts:  Yes.  with intent/plan  Homicidal Thoughts:  No  Memory:  Immediate;   Good Recent;   Good Remote;   Good  Judgement:  Poor  Insight:  Good  Psychomotor Activity:  Normal  Concentration:  Good  Recall:  NA  Fund of Knowledge:Fair  Language: Good  Akathisia:  No  Handed:  Right  AIMS (if indicated):     Assets:  Desire for Improvement Housing Intimacy Transportation  ADL's:  Intact  Cognition: WNL  Sleep:      Disposition:   -Seek psychiatric inpatient placement (500 hall or similar) for safety and stabilization  Benjamine Mola, FNP, FNP-BC 08/09/2015 11:45 AM

## 2015-08-09 NOTE — Tx Team (Signed)
Initial Interdisciplinary Treatment Plan   PATIENT STRESSORS: Financial difficulties Health problems Substance abuse   PATIENT STRENGTHS: Curator fund of knowledge Supportive family/friends   PROBLEM LIST: Problem List/Patient Goals Date to be addressed Date deferred Reason deferred Estimated date of resolution  Depression 08/09/15     Substance abuse 08/09/15     Psychosis 08/09/15           "identify my mood swings more" 08/09/15                              DISCHARGE CRITERIA:  Adequate post-discharge living arrangements Improved stabilization in mood, thinking, and/or behavior Need for constant or close observation no longer present Verbal commitment to aftercare and medication compliance  PRELIMINARY DISCHARGE PLAN: Attend aftercare/continuing care group Outpatient therapy Placement in alternative living arrangements  PATIENT/FAMIILY INVOLVEMENT: This treatment plan has been presented to and reviewed with the patient, Maureen Swanson, and/or family member.  The patient and family have been given the opportunity to ask questions and make suggestions.  Erik Obey Parsells 08/09/2015, 10:58 PM

## 2015-08-09 NOTE — ED Notes (Signed)
Room is not ready, Pt can not transport until after shift change per [facility]

## 2015-08-09 NOTE — ED Notes (Signed)
Patient appears flat, cooperative. Denies SI, HI, AVH at present. Reports passive thoughts of SI and HI throughout the day. Rates feelings of anxiety and depression at 8/10. Reports no recent changes to appetite or sleep habits. C/o neuropathy pain in her feet. Inquired about pending transfer.   Encouragement offered.   Q 15 safety checks continue.

## 2015-08-10 ENCOUNTER — Encounter: Payer: Self-pay | Admitting: Psychiatry

## 2015-08-10 DIAGNOSIS — F250 Schizoaffective disorder, bipolar type: Principal | ICD-10-CM

## 2015-08-10 LAB — LIPID PANEL
Cholesterol: 159 mg/dL (ref 0–200)
HDL: 48 mg/dL (ref >40)
LDL CALC: 87 mg/dL (ref 0–99)
TRIGLYCERIDES: 122 mg/dL (ref <150)
Total CHOL/HDL Ratio: 3.3 RATIO
VLDL: 24 mg/dL (ref 0–40)

## 2015-08-10 LAB — URINALYSIS COMPLETE WITH MICROSCOPIC (ARMC ONLY)
BILIRUBIN URINE: NEGATIVE
GLUCOSE, UA: NEGATIVE mg/dL
HGB URINE DIPSTICK: NEGATIVE
Ketones, ur: NEGATIVE mg/dL
NITRITE: NEGATIVE
PH: 5 (ref 5.0–8.0)
PROTEIN: NEGATIVE mg/dL
Specific Gravity, Urine: 1.005 (ref 1.005–1.030)

## 2015-08-10 LAB — HEMOGLOBIN A1C
Hgb A1c MFr Bld: 5.8 % — ABNORMAL HIGH (ref 4.8–5.6)
Hgb A1c MFr Bld: 5.4 % (ref 4.0–6.0)
Mean Plasma Glucose: 120 mg/dL

## 2015-08-10 LAB — GLUCOSE, CAPILLARY
GLUCOSE-CAPILLARY: 122 mg/dL — AB (ref 65–99)
Glucose-Capillary: 136 mg/dL — ABNORMAL HIGH (ref 65–99)
Glucose-Capillary: 92 mg/dL (ref 65–99)
Glucose-Capillary: 87 mg/dL (ref 65–99)

## 2015-08-10 LAB — TSH: TSH: 0.93 u[IU]/mL (ref 0.350–4.500)

## 2015-08-10 MED ORDER — NICOTINE 21 MG/24HR TD PT24
21.0000 mg | MEDICATED_PATCH | Freq: Every day | TRANSDERMAL | Status: DC
  Administered 2015-08-10 – 2015-08-15 (×5): 21 mg via TRANSDERMAL
  Filled 2015-08-10 (×5): qty 1

## 2015-08-10 MED ORDER — ARIPIPRAZOLE ER 400 MG IM SUSR
400.0000 mg | INTRAMUSCULAR | Status: DC
Start: 2015-08-27 — End: 2015-08-15

## 2015-08-10 NOTE — Progress Notes (Signed)
Recreation Therapy Notes  INPATIENT RECREATION THERAPY ASSESSMENT  Patient Details Name: Maureen Swanson MRN: [id number] DOB: 01/02/1966 Today's Date: 08/10/2015  Patient Stressors: Family, Relationship, Friends, Other (Comment) (Not a good relationship with family - doesn't talk to them; "wishy washy" relationship with boyfriend due to alcohol - he drinks a lot; lakc of supportive friends; living situation; finances; health problems)  Coping Skills:   Isolate, Arguments, Substance Abuse, Avoidance, Self-Injury, Exercise, Art/Dance, Music, Sports, Other (Comment) (Watch TV)  Personal Challenges: Anger, Communication, Concentration, Decision-Making, Expressing Yourself, Problem-Solving, Relationships, Self-Esteem/Confidence, Social Interaction, Stress Management, Substance Abuse, Trusting Others  Leisure Interests (2+):  Individual - TV, Music - Listen, Individual - Other (Comment) (Walk)  Awareness of Community Resources:  Yes  Community Resources:  Library, Other (Comment) Aeronautical engineer)  Current Use: Yes  If no, Barriers?:    Patient Strengths:  Easy-going, good listener  Patient Identified Areas of Improvement:  Can't think of any  Current Recreation Participation:  Drinking  Patient Goal for Hospitalization:  Learn why she gets depressed adn what causes her anxiety  [place] of Residence:  [place] of Residence:  [place]   Current [place] (including self-harm):  No  Current HI:  No  Consent to Intern Participation: N/A   Leonette Monarch, LRT/CTRS 08/10/2015, 4:37 PM
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
## 2015-08-10 NOTE — Plan of Care (Signed)
Problem: Ineffective individual coping Goal: LTG: Patient will report a decrease in negative feelings Outcome: Progressing Patient is reporting a slight decrease in negative feelings this shift
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
## 2015-08-10 NOTE — Plan of Care (Signed)
Problem: Ineffective individual coping Goal: STG: Patient will remain free from self harm Outcome: Progressing Patient remains free from self harm currently     

## 2015-08-10 NOTE — H&P (Signed)
Psychiatric Admission Assessment Adult  Patient Identification: Maureen Swanson MRN:  [id number] Date of Evaluation:  08/10/2015 Chief Complaint:  Schizoaffective Disorder Principal Diagnosis: Schizoaffective disorder, bipolar type ([place]) Diagnosis:   Patient Active Problem List   Diagnosis Date Noted  . Tobacco use disorder [F17.200] 07/20/2015  . Diabetes mellitus ([place]) [E11.9] 07/20/2015  . Suicidal ideation [R45.851]   . Cocaine use disorder, moderate, dependence ([place]) [F14.20] 04/03/2015  . Schizoaffective disorder, bipolar type ([place]) [F25.0] 04/03/2015  . Alcohol use disorder, moderate, dependence ([place]) [F10.20] 04/03/2015  . Acute ischemic stroke ([place]) [I63.9] 12/08/2014  . Left-sided weakness [M62.89] 12/08/2014  . 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 Knee pain [M25.569] 10/17/2010  . MAMMOGRAM, ABNORMAL, RIGHT [R92.8] 08/05/2010  . Dyslipidemia [E78.5] 06/21/2010  . AMENORRHEA, SECONDARY [N91.2] 10/29/2009  . 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 GERD [K21.9] 10/11/2007  . Essential hypertension [I10] 02/26/2007   History of Present Illness:  Identifying data. Maureen Swanson is a 50 year old female with a history of schizoaffective disorder and substance use.  Chief complaint. "I had an argument with my boyfriend."  History of present illness. Information was obtained from the patient and the chart. The patient has a long history of depression and mood instability. She was recently hospitalized at [facility]. She was discharged on February 16 after 2 week hospitalization. She returned home to her boyfriend of 9 years. Shortly after they had an argument and he kicked her out. She has been staying here and there. She has not been taking her medication consistently. She became increasingly depressed with poor sleep, decreased appetite, anhedonia, feeling of guilt worthlessness hopelessness, poor energy and concentration, social isolation, crying spells and suicidal  thinking. She wanted to cut herself. She attempted suicide in the past by cutting and overdose. She denies psychotic symptoms. She denies symptoms suggestive of bipolar mania. She reports heightened anxiety with panic attacks. She relapsed on alcohol and cocaine even though she does not feel that this is a problem.  Past psychiatric history. Multiple psychiatric admissions. Multiple medication trials. 2 suicide attempts at least by cutting and overdose. She does not remember her past medication trials. She believes that her current regimen is the best she's ever taken. She minimizes substance use.  Family psychiatric history. She has an aunt with bipolar.  Social history. As she used to work as a Development worker, international aid, Pension scheme manager, Pensions consultant. She is no longer able to work due to physical problems. She applied for disability month ago with the help of her providers and family services. She has a Chief Executive Officer. She is homeless after she split with her boyfriend of 9 years. She has a son but does not get along with his wife.   Total Time spent with patient: 1 hour  Past Psychiatric History: Bipolar disorder and substance use. Is the patient at risk to self? Yes.    Has the patient been a risk to self in the past 6 months? Yes.    Has the patient been a risk to self within the distant past? Yes.    Is the patient a risk to others? No.  Has the patient been a risk to others in the past 6 months? No.  Has the patient been a risk to others within the distant past? No.   Prior Inpatient Therapy:   Prior Outpatient Therapy:    Alcohol Screening: 1. How often do you have a drink containing alcohol?: 4 or more times a week 2. How many drinks containing  alcohol do you have on a typical day when you are drinking?: 3 or 4 3. How often do you have six or more drinks on one occasion?: Monthly Preliminary Score: 3 4. How often during the last year have you found that you were not able to stop drinking once you had  started?: Less than monthly 5. How often during the last year have you failed to do what was normally expected from you becasue of drinking?: Monthly 6. How often during the last year have you needed a first drink in the morning to get yourself going after a heavy drinking session?: Weekly 7. How often during the last year have you had a feeling of guilt of remorse after drinking?: Weekly 8. How often during the last year have you been unable to remember what happened the night before because you had been drinking?: Less than monthly 9. Have you or someone else been injured as a result of your drinking?: Yes, but not in the last year 10. Has a relative or friend or a doctor or another health worker been concerned about your drinking or suggested you cut down?: Yes, during the last year Alcohol Use Disorder Identification Test Final Score (AUDIT): 23 Brief Intervention: Yes Substance Abuse History in the last 12 months:  Yes.   Consequences of Substance Abuse: Negative Previous Psychotropic Medications: Yes  Psychological Evaluations: No  Past Medical History:  Past Medical History  Diagnosis Date  . High cholesterol   . Depression   . Gout   . Anxiety   . Bipolar 1 disorder ([place])   . GERD (gastroesophageal reflux disease)   . Substance abuse     crack cocaine  . Headache(784.0)     migraines  . TMJ (temporomandibular joint disorder)   . Asthma     daily and prn inhalers  . Arthritis     back and knees  . Gastric ulcer   . 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 Hypertension     under control, has been on med. x 12 yrs.  . Diabetes mellitus     diet-controlled  . CHF (congestive heart failure) [facility])     Past Surgical History  Procedure Laterality Date  . Knee arthroscopy w/ partial medial meniscectomy  05/01/2010    right  . Breast lumpectomy with needle localization  04/19/2012    Procedure: BREAST LUMPECTOMY WITH NEEDLE LOCALIZATION;  Surgeon: [name], MD;  Location: [place];  Service: General;  Laterality: Right;   Family History:  Family History  Problem Relation Age of Onset  . Diabetes Mother   . Breast cancer Mother   . Cancer Father   . Bipolar disorder Maternal Aunt   . Schizophrenia Maternal Grandfather   . Alcoholism Maternal Uncle    Family Psychiatric  History: Bipolar disorder.  Tobacco Screening: @FLOW [phone])::1)@ Social History:  History  Alcohol Use  . 0.0 oz/week    Comment: reports she drinks 3 40 ounce beers daily     History  Drug Use  . Yes  . Special: Cocaine    Comment: last use 1/30 (07/18/15)    Additional Social History:      Pain Medications: see PTA meds Prescriptions: see PTA meds Over the Counter: see PTA meds History of alcohol / drug use?: Yes                    Allergies:   Allergies  Allergen Reactions  . Chocolate Hives  . Orange Hives    "Acid foods"  . Penicillins Hives    Has patient had a PCN reaction causing immediate rash, facial/tongue/throat swelling, SOB or lightheadedness with hypotension: Yes Has patient had a PCN reaction causing severe rash involving mucus membranes or skin necrosis: Yes Has patient had a PCN reaction that required hospitalization No Has patient had a PCN reaction occurring within the last 10 years: No If all of the above answers are "NO", then may proceed with Cephalosporin use.   . Other Swelling    strawberries  . Tomato Hives    "acid foods"   Lab Results:  Results for orders placed or performed during the hospital encounter of 08/09/15 (from the past 48 hour(s))  Glucose, capillary     Status: Abnormal   Collection Time: 08/10/15  6:53 AM  Result Value Ref Range   Glucose-Capillary 122 (H) 65 - 99 mg/dL    Blood Alcohol level:  Lab Results  Component Value Date   ETH <5 08/08/2015   ETH <5 [id number]    Metabolic Disorder Labs:  Lab Results  Component Value Date   HGBA1C 5.8* 08/07/2015    MPG 120 08/07/2015   MPG 117 07/21/2015   Lab Results  Component Value Date   PROLACTIN 141.3* 07/21/2015   PROLACTIN 81.7* 04/04/2015   Lab Results  Component Value Date   CHOL 145 07/21/2015   TRIG 170* 07/21/2015   HDL 48 07/21/2015   CHOLHDL 3.0 07/21/2015   VLDL 34 07/21/2015   LDLCALC 63 07/21/2015   LDLCALC 73 04/04/2015    Current Medications: Current Facility-Administered Medications  Medication Dose Route Frequency Provider Last Rate Last Dose  . acetaminophen (TYLENOL) tablet 650 mg  650 mg Oral Q6H PRN Benjamine Mola, FNP      . albuterol (PROVENTIL HFA;VENTOLIN HFA) 108 (90 Base) MCG/ACT inhaler 2 puff  2 puff Inhalation Q6H PRN Benjamine Mola, FNP      . alum & mag hydroxide-simeth (MAALOX/MYLANTA) 200-200-20 MG/5ML suspension 30 mL  30 mL Oral Q4H PRN Benjamine Mola, FNP      . amLODipine (NORVASC) tablet 5 mg  5 mg Oral Daily Benjamine Mola, FNP      . ARIPiprazole (ABILIFY) tablet 5 mg  5 mg Oral Daily Benjamine Mola, FNP      . [START ON 08/27/2015] ARIPiprazole SUSR 400 mg  400 mg Intramuscular Q28 days Danel Studzinski B Pressley Barsky, MD      . aspirin chewable tablet 81 mg  81 mg Oral Daily Benjamine Mola, FNP      . atorvastatin (LIPITOR) tablet 40 mg  40 mg Oral QPM Benjamine Mola, FNP      . calcium carbonate (TUMS - dosed in mg elemental calcium) chewable tablet 500 mg of elemental calcium  2.5 tablet Oral Q breakfast Benjamine Mola, FNP   500 mg of elemental calcium at 08/10/15 0834  . celecoxib (CELEBREX) capsule 100 mg  100 mg Oral BID Benjamine Mola, FNP      . furosemide (LASIX) tablet 40 mg  40 mg Oral Daily Benjamine Mola, FNP      . gabapentin (NEURONTIN) capsule 400 mg  400 mg Oral BID Benjamine Mola, FNP      . hydrOXYzine (ATARAX/VISTARIL) tablet 25 mg  25 mg Oral Q6H PRN Benjamine Mola, FNP      . insulin aspart (novoLOG) injection 0-5 Units  0-5  Units Subcutaneous QHS Benjamine Mola, FNP      . lamoTRIgine (LAMICTAL) tablet 25 mg  25 mg Oral Daily Benjamine Mola, FNP      . linagliptin (TRADJENTA) tablet 5 mg  5 mg Oral Daily John C Withrow, FNP      . magnesium hydroxide (MILK OF MAGNESIA) suspension 30 mL  30 mL Oral Daily PRN Benjamine Mola, FNP      . metFORMIN (GLUCOPHAGE) tablet 1,000 mg  1,000 mg Oral BID WC Benjamine Mola, FNP   1,000 mg at 08/10/15 0834  . nicotine (NICODERM CQ - dosed in mg/24 hours) patch 21 mg  21 mg Transdermal Daily Traci Gafford B Iman Orourke, MD      . Oxcarbazepine (TRILEPTAL) tablet 300 mg  300 mg Oral BID Benjamine Mola, FNP      . pantoprazole (PROTONIX) EC tablet 40 mg  40 mg Oral BID AC Benjamine Mola, FNP   40 mg at 08/10/15 [id number]  . QUEtiapine (SEROQUEL XR) 24 hr tablet 100 mg  100 mg Oral Q2000 Benjamine Mola, FNP       PTA Medications: Prescriptions prior to admission  Medication Sig Dispense Refill Last Dose  . amLODipine (NORVASC) 5 MG tablet Take 1 tablet (5 mg total) by mouth daily. 30 tablet 0 08/09/2015 at Unknown time  . ARIPiprazole (ABILIFY) 5 MG tablet Take 1 tablet (5 mg) in the morning.  Take 2 tablets (10 mg) in the evening. 90 tablet 0 08/09/2015 at Unknown time  . ARIPiprazole 400 MG SUSR Inject 400 mg into the muscle every 28 (twenty-eight) days. Last dose given 07/29/2014.  Next dose due 08/27/2015 1 each 0 Past Week at Unknown time  . aspirin 81 MG tablet Take 1 tablet (81 mg total) by mouth daily. 30 tablet 0 08/09/2015 at Unknown time  . atenolol (TENORMIN) 100 MG tablet Take 1 tablet (100 mg total) by mouth daily. 30 tablet 0 08/09/2015 at Unknown time  . baclofen (LIORESAL) 10 MG tablet Take 1 tablet (10 mg total) by mouth 3 (three) times daily. (Patient taking differently: Take 10 mg by mouth 3 (three) times daily as needed for muscle spasms. ) 30 each 0 08/09/2015 at Unknown time  . celecoxib (CELEBREX) 100 MG capsule Take 1 capsule (100 mg total) by mouth 2 (two) times daily. 60 capsule 0 08/09/2015 at Unknown time  . folic acid (FOLVITE) 1 MG tablet Take 1 tablet (1 mg total) by mouth daily.  30 tablet 0 08/09/2015 at Unknown time  . furosemide (LASIX) 40 MG tablet Take 1 tablet (40 mg total) by mouth daily. 30 tablet 0 08/09/2015 at Unknown time  . gabapentin (NEURONTIN) 400 MG capsule Take 1 capsule (400 mg total) by mouth 2 (two) times daily. 60 capsule 0 08/09/2015 at Unknown time  . hydrOXYzine (ATARAX/VISTARIL) 25 MG tablet Take 1 tablet (25 mg total) by mouth every 6 (six) hours as needed for anxiety. 30 tablet 0 08/09/2015 at Unknown time  . insulin aspart (NOVOLOG) 100 UNIT/ML injection Inject 0-15 Units into the skin 3 (three) times daily with meals. Use Sliding scale given to you by [facility] Nurse.  Resume home regimen 10 mL 0 08/09/2015 at Unknown time  . insulin glargine (LANTUS) 100 UNIT/ML injection Inject 0.3 mLs (30 Units total) into the skin at bedtime. To be managed by your outpatient provider.  Resume home regimen. 10 mL 0 Past Week at Unknown time  . metFORMIN (GLUCOPHAGE) 1000 MG tablet  Take 1 tablet (1,000 mg total) by mouth 2 (two) times daily with a meal. 60 tablet 0 08/08/2015 at Unknown time  . naltrexone (DEPADE) 50 MG tablet Take 1 tablet (50 mg total) by mouth daily. 30 tablet 0 08/08/2015 at Unknown time  . Oxcarbazepine (TRILEPTAL) 300 MG tablet Take 1 tablet (300 mg total) by mouth 2 (two) times daily. 60 tablet 0 08/09/2015 at Unknown time  . pantoprazole (PROTONIX) 40 MG tablet Take 1 tablet (40 mg total) by mouth 2 (two) times daily before a meal. 60 tablet 0 08/09/2015 at Unknown time  . potassium chloride SA (K-DUR,KLOR-CON) 20 MEQ tablet Take 1 tablet (20 mEq total) by mouth daily. 30 tablet 0 08/08/2015 at Unknown time  . QUEtiapine (SEROQUEL XR) 50 MG TB24 24 hr tablet Take 2 tablets (100 mg total) by mouth daily at 8 pm. 60 tablet 0 08/09/2015 at Unknown time  . temazepam (RESTORIL) 15 MG capsule Take 1 capsule (15 mg total) by mouth at bedtime. 30 capsule 0 08/09/2015 at Unknown time  . albuterol (PROVENTIL HFA;VENTOLIN HFA) 108 (90 Base) MCG/ACT inhaler Inhale 2  puffs into the lungs every 6 (six) hours as needed for wheezing or shortness of breath.   Past Week at Unknown time  . atorvastatin (LIPITOR) 40 MG tablet Take 1 tablet (40 mg total) by mouth every evening. (Patient not taking: Reported on 08/09/2015) 30 tablet 0 08/07/2015 at Unknown time  . benztropine (COGENTIN) 0.5 MG tablet Take 1 tablet (0.5 mg) in the morning.  Take 1 tablet (0.5 mg) at night. (Patient not taking: Reported on 08/08/2015) 60 tablet 0 Not Taking at Unknown time  . calcium carbonate (OS-CAL - DOSED IN MG OF ELEMENTAL CALCIUM) 1250 (500 Ca) MG tablet Take 1 tablet (500 mg of elemental calcium total) by mouth daily with breakfast. 30 tablet 0 08/07/2015 at Unknown time  . hydrochlorothiazide (HYDRODIURIL) 25 MG tablet Take 1 tablet (25 mg total) by mouth daily. (Patient not taking: Reported on 08/08/2015) 30 tablet 0 Not Taking at Unknown time  . lamoTRIgine (LAMICTAL) 25 MG tablet Take one tab (25 mg) in the morning.  Then take 2 tabs (50 mg) in the evening. 30 tablet 0 08/07/2015 at Unknown time  . linagliptin (TRADJENTA) 5 MG TABS tablet Take 1 tablet (5 mg total) by mouth daily. 30 tablet 0 08/08/2015 at Unknown time    Musculoskeletal: Strength & Muscle Tone: within normal limits Gait & Station: normal Patient leans: N/A  Psychiatric Specialty Exam: Physical Exam  Nursing note and vitals reviewed. Constitutional: She is oriented to person, place, and time. She appears well-developed and well-nourished.  HENT:  Head: Normocephalic and atraumatic.  Eyes: Conjunctivae and EOM are normal. Pupils are equal, round, and reactive to light.  Neck: Normal range of motion.  Cardiovascular: Normal rate, regular rhythm and normal heart sounds.   Respiratory: Effort normal and breath sounds normal.  GI: Soft.  Musculoskeletal: Normal range of motion.  Neurological: She is alert and oriented to person, place, and time.  Skin: Skin is warm and dry.    Review of Systems   Musculoskeletal: Positive for joint pain.  Psychiatric/Behavioral: Positive for depression, suicidal ideas and substance abuse.  All other systems reviewed and are negative.   Blood pressure 141/96, pulse 81, temperature 98.9 F (37.2 C), temperature source Oral, resp. rate 20, height 5\' 4"  (1.626 m), weight 103.42 kg (228 lb).Body mass index is 39.12 kg/(m^2).  See SRA.  Sleep:  Number of Hours: 6.75     Treatment Plan Summary: Daily contact with patient to assess and evaluate symptoms and progress in treatment and Medication management   Mrs. Gomer is a 50 year old female with history of schizoaffective disorder and multiple medical problems admitted for worsening of depression and suicidal ideation in the context of severe social stressors treatment noncompliance.  1. Suicidal ideation. The patient is able to contract for safety in the hospital.   2. Mood. The patient has been started on Abilify maintena during her previous hospitalization. Next injection on March 13. We will continue on Abilify along with Trileptal for mood stabilization. The patient was also started on Lamictal but has been noncompliant. She also has Seroquel 100 mg at bedtime most likely for sleep.  3. Hypertension. We will continue Norvasc and furosemide.  4. Diabetes. We'll continue metformin, Tradjenta, ADA diet and blood glucose monitoring and sliding scale insulin.  5. Diabetic neuropathy. Will continue Neurontin.  6. Dyslipidemia. We'll continue Lipitor.  7. Arthritis. We'll continue Celebrex.  8. Alcohol use. The patient admits to some drinking. She does not think she needs detox. Vital signs are stable. There are no symptoms of withdrawal.  9. Cocaine abuse. The patient denies daily cocaine use.  10. Substance abuse treatment. She is not interested at present.  11. Smoking. Nicotine patches available.  12. Insomnia. She has  Seroquel at bedtime.  13.  Disposition. She will be discharged to the homeless shelter. She will [facility] up with [facility] where she receives free medications.    Observation Level/Precautions:  15 minute checks  Laboratory:  CBC Chemistry Profile UDS UA  Psychotherapy:    Medications:    Consultations:    Discharge Concerns:    Estimated LOS:  Other:     I certify that inpatient services furnished can reasonably be expected to improve the patient's condition.    Orson Slick, MD 2/24/20178:42 AM

## 2015-08-10 NOTE — Progress Notes (Signed)
D:  Patient is alert and oriented on the unit this shift.  Patient did not attend group today.  Patient denies suicidal ideation, homicidal ideation, auditory or visual hallucinations at the present time.   A:  Scheduled medications are administered to patient as per MD orders.  Emotional support and encouragement are provided.  Patient is maintained on q.15 minute safety checks.  Patient is informed to notify staff with questions or concerns. R:  No adverse medication reactions are noted.  Patient is cooperative with medication administration and treatment plan today.  Patient is receptive, calm and cooperative on the unit at this time.  Patient interacts minimally wiith others on the unit this shift.  Patient contracts for safety at this time.  Patient remains safe at this time.

## 2015-08-10 NOTE — BHH Group Notes (Signed)
[facility] LCSW Aftercare Discharge Planning Group Note   08/10/2015 3:47 PM  Participation Quality:  Patient attended group and participated appropriately sharing her SMART goal is to "get back on my meds and back to life."   Mood/Affect:  Appropriate  Depression Rating:  8  Anxiety Rating:  8  Thoughts of Suicide:  No Will you contract for safety?   NA  Current AVH:  No  Plan for Discharge/Comments:  Patient is currently homeless but has an outpatient provider  Transportation Means: patient reports she has transportation available at discharge  Supports: friends, limited family, and outpatient provider  Keene Breath, MSW, LCSWA

## 2015-08-10 NOTE — BHH Group Notes (Signed)
[place] Group Notes:  (Nursing/MHT/Case Management/Adjunct)  Date:  08/10/2015  Time:  12:00 PM  Type of Therapy:  Psychoeducational Skills  Participation Level:  Did Not Attend   [name] 08/10/2015, 12:00 PM

## 2015-08-10 NOTE — BHH Group Notes (Signed)
[place] LCSW Group Therapy  08/10/2015 3:22 PM  Type of Therapy:  Group Therapy  Participation Level:  Active  Participation Quality:  Appropriate, Sharing and Supportive  Affect:  Appropriate  Cognitive:  Alert  Insight:  Engaged  Engagement in Therapy:  Engaged and Supportive  Modes of Intervention:  Discussion, Socialization and Support  Summary of Progress/Problems: Patient attended and participated in group discussion. She introduced herself and participated in the introductory activity. Patient successfully identified triggers to relapse and discussed emotions related to relapse and recovery. Patient provided support to group members throughout the group discussion.Christa See, CSW Intern 08/10/2015, 3:22 PM  Carmell Austria, MSW, LCSWA 08/10/2015, 3:46 PM

## 2015-08-10 NOTE — Progress Notes (Signed)
Recreation Therapy Notes  Date: 02.24.17 Time: 3:10 pm Location: Community Room  Group Topic: Coping Skills  Goal Area(s) Addresses:  Patient will participate in a coping skill. Patient will verbalize benefit of using art as a coping skill.  Behavioral Response: Attentive  Intervention: Coloring  Activity: Patients were given coloring sheets and instructed to color while thinking about the emotions they are feeling and what their mind is focused on.  Education: LRT educated patients on healthy coping skills.  Education Outcome: In group clarification offered  Clinical Observations/Feedback: Patient colored coloring sheet. Patient did not contribute to group discussion.  Leonette Monarch, LRT/CTRS 08/10/2015 4:28 PM

## 2015-08-10 NOTE — Progress Notes (Signed)
Patient did attend the afternoon social worker group today.

## 2015-08-11 LAB — GLUCOSE, CAPILLARY
GLUCOSE-CAPILLARY: 106 mg/dL — AB (ref 65–99)
GLUCOSE-CAPILLARY: 83 mg/dL (ref 65–99)
Glucose-Capillary: 89 mg/dL (ref 65–99)
Glucose-Capillary: 89 mg/dL (ref 65–99)

## 2015-08-11 LAB — PROLACTIN: PROLACTIN: 5.7 ng/mL (ref 4.8–23.3)

## 2015-08-11 MED ORDER — ARIPIPRAZOLE 10 MG PO TABS
10.0000 mg | ORAL_TABLET | Freq: Every day | ORAL | Status: DC
  Administered 2015-08-12 – 2015-08-15 (×3): 10 mg via ORAL
  Filled 2015-08-11 (×4): qty 1

## 2015-08-11 MED ORDER — QUETIAPINE FUMARATE ER 50 MG PO TB24
400.0000 mg | ORAL_TABLET | Freq: Every day | ORAL | Status: DC
  Administered 2015-08-12: 400 mg via ORAL
  Filled 2015-08-11 (×2): qty 8

## 2015-08-11 MED ORDER — TEMAZEPAM 15 MG PO CAPS
15.0000 mg | ORAL_CAPSULE | Freq: Every evening | ORAL | Status: DC | PRN
  Administered 2015-08-11: 15 mg via ORAL
  Filled 2015-08-11: qty 1

## 2015-08-11 NOTE — Plan of Care (Signed)
Problem: Ineffective individual coping Goal: LTG: Patient will report a decrease in negative feelings Outcome: Progressing Patient reports feeling better today than she did yesterday     

## 2015-08-11 NOTE — Progress Notes (Signed)

## 2015-08-11 NOTE — Plan of Care (Signed)
Problem: Alteration in mood Goal: LTG-Patient reports reduction in suicidal thoughts (Patient reports reduction in suicidal thoughts and is able to verbalize a safety plan for whenever patient is feeling suicidal)  Outcome: Progressing Patient denies SI.      

## 2015-08-11 NOTE — Plan of Care (Signed)
Problem: Ineffective individual coping Goal: STG-Increase in ability to manage activities of daily living Outcome: Progressing Patient is managing her activities of daily living this shift

## 2015-08-11 NOTE — BHH Group Notes (Signed)
[place] LCSW Group Therapy  08/11/2015 4:12 PM  Type of Therapy:  Group Therapy  Participation Level:  Did Not Attend  Modes of Intervention:  Discussion, Education, Socialization and Support  Summary of Progress/Problems: Pt will identify unhealthy thoughts and how they impact their emotions and behavior. Pt will be encouraged to discuss these thoughts, emotions and behaviors with the group.   [place] MSW, LCSWA  08/11/2015, 4:12 PM

## 2015-08-11 NOTE — Progress Notes (Signed)
[facility] MD Progress Note  08/11/2015 8:16 PM HEBA ARUTYUNYAN  MRN:  [id number]  Subjective: Ms. Wehmeyer reports moderate improvement but still feels suicidal. She did not sleep last night. She is out of her room participating in programming.   Principal Problem: Schizoaffective disorder, bipolar type ([place]) Diagnosis:   Patient Active Problem List   Diagnosis Date Noted  . Tobacco use disorder [F17.200] 07/20/2015  . Diabetes mellitus ([place]) [E11.9] 07/20/2015  . Suicidal ideation [R45.851]   . Cocaine use disorder, moderate, dependence ([place]) [F14.20] 04/03/2015  . Schizoaffective disorder, bipolar type ([place]) [F25.0] 04/03/2015  . Alcohol use disorder, moderate, dependence ([place]) [F10.20] 04/03/2015  . Acute ischemic stroke ([place]) [I63.9] 12/08/2014  . Left-sided weakness [M62.89] 12/08/2014  . 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 Knee pain [M25.569] 10/17/2010  . MAMMOGRAM, ABNORMAL, RIGHT [R92.8] 08/05/2010  . Dyslipidemia [E78.5] 06/21/2010  . AMENORRHEA, SECONDARY [N91.2] 10/29/2009  . 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 GERD [K21.9] 10/11/2007  . Essential hypertension [I10] 02/26/2007   Total Time spent with patient: 20 minutes  Past Psychiatric History: schizoaffective disorder.  Past Medical History:  Past Medical History  Diagnosis Date  . High cholesterol   . Depression   . Gout   . Anxiety   . Bipolar 1 disorder ([place])   . GERD (gastroesophageal reflux disease)   . Substance abuse     crack cocaine  . Headache(784.0)     migraines  . TMJ (temporomandibular joint disorder)   . Asthma     daily and prn inhalers  . Arthritis     back and knees  . Gastric ulcer   . 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 Hypertension     under control, has been on med. x 12 yrs.  . Diabetes mellitus     diet-controlled  . CHF (congestive heart failure) [facility])     Past Surgical History  Procedure Laterality Date  . Knee arthroscopy w/ partial  medial meniscectomy  05/01/2010    right  . Breast lumpectomy with needle localization  04/19/2012    Procedure: BREAST LUMPECTOMY WITH NEEDLE LOCALIZATION;  Surgeon: [name], MD;  Location: [place];  Service: General;  Laterality: Right;   Family History:  Family History  Problem Relation Age of Onset  . Diabetes Mother   . Breast cancer Mother   . Cancer Father   . Bipolar disorder Maternal Aunt   . Schizophrenia Maternal Grandfather   . Alcoholism Maternal Uncle    Family Psychiatric  History: see H&P. Social History:  History  Alcohol Use  . 0.0 oz/week    Comment: reports she drinks 3 40 ounce beers daily     History  Drug Use  . Yes  . Special: Cocaine    Comment: last use 1/30 (07/18/15)    Social History   Social History  . Marital Status: Single    Spouse Name: N/A  . Number of Children: N/A  . Years of Education: N/A   Social History Main Topics  . Smoking status: Current Every Day Smoker -- 0.50 packs/day for 15 years    Types: Cigarettes    Last Attempt to Quit: 08/02/2012  . Smokeless tobacco: Never Used  . Alcohol Use: 0.0 oz/week     Comment: reports she drinks 3 40 ounce beers daily  . Drug Use: Yes    Special: Cocaine     Comment: last use 1/30 (  07/18/15)  . Sexual Activity: No   Other Topics Concern  . None   Social History Narrative   Additional Social History:    Pain Medications: see PTA meds Prescriptions: see PTA meds Over the Counter: see PTA meds History of alcohol / drug use?: Yes                    Sleep: Fair  Appetite:  Fair  Current Medications: Current Facility-Administered Medications  Medication Dose Route Frequency Provider Last Rate Last Dose  . acetaminophen (TYLENOL) tablet 650 mg  650 mg Oral Q6H PRN Benjamine Mola, FNP      . albuterol (PROVENTIL HFA;VENTOLIN HFA) 108 (90 Base) MCG/ACT inhaler 2 puff  2 puff Inhalation Q6H PRN Benjamine Mola, FNP      . alum & mag hydroxide-simeth  (MAALOX/MYLANTA) 200-200-20 MG/5ML suspension 30 mL  30 mL Oral Q4H PRN Benjamine Mola, FNP      . amLODipine (NORVASC) tablet 5 mg  5 mg Oral Daily Benjamine Mola, FNP   5 mg at 08/11/15 [id number]  . [START ON 08/12/2015] ARIPiprazole (ABILIFY) tablet 10 mg  10 mg Oral Daily Coda Filler B Penne Rosenstock, MD      . Derrill Memo ON 08/27/2015] ARIPiprazole SUSR 400 mg  400 mg Intramuscular Q28 days Jonavin Seder B Bob Daversa, MD      . aspirin chewable tablet 81 mg  81 mg Oral Daily Benjamine Mola, FNP   81 mg at 08/11/15 [id number]  . atorvastatin (LIPITOR) tablet 40 mg  40 mg Oral QPM Benjamine Mola, FNP   40 mg at 08/11/15 1658  . calcium carbonate (TUMS - dosed in mg elemental calcium) chewable tablet 500 mg of elemental calcium  2.5 tablet Oral Q breakfast Benjamine Mola, FNP   500 mg of elemental calcium at 08/11/15 0821  . celecoxib (CELEBREX) capsule 100 mg  100 mg Oral BID Benjamine Mola, FNP   100 mg at 08/11/15 [id number]  . furosemide (LASIX) tablet 40 mg  40 mg Oral Daily Benjamine Mola, FNP   40 mg at 08/11/15 [id number]  . gabapentin (NEURONTIN) capsule 400 mg  400 mg Oral BID Benjamine Mola, FNP   400 mg at 08/11/15 [id number]  . hydrOXYzine (ATARAX/VISTARIL) tablet 25 mg  25 mg Oral Q6H PRN Benjamine Mola, FNP   25 mg at 08/11/15 [id number]  . insulin aspart (novoLOG) injection 0-5 Units  0-5 Units Subcutaneous QHS Benjamine Mola, FNP   0 Units at 08/10/15 2237  . lamoTRIgine (LAMICTAL) tablet 25 mg  25 mg Oral Daily Benjamine Mola, FNP   25 mg at 08/11/15 [id number]  . linagliptin (TRADJENTA) tablet 5 mg  5 mg Oral Daily Benjamine Mola, FNP   5 mg at 08/11/15 [id number]  . magnesium hydroxide (MILK OF MAGNESIA) suspension 30 mL  30 mL Oral Daily PRN Benjamine Mola, FNP      . metFORMIN (GLUCOPHAGE) tablet 1,000 mg  1,000 mg Oral BID WC Benjamine Mola, FNP   1,000 mg at 08/11/15 1658  . nicotine (NICODERM CQ - dosed in mg/24 hours) patch 21 mg  21 mg Transdermal Daily Aymen Widrig B Coley Kulikowski, MD   21 mg at 08/10/15 1032  . Oxcarbazepine (TRILEPTAL)  tablet 300 mg  300 mg Oral BID Benjamine Mola, FNP   300 mg at 08/11/15 [id number]  . pantoprazole (PROTONIX) EC tablet 40 mg  40 mg Oral BID AC Benjamine Mola, FNP  40 mg at 08/11/15 1658  . [START ON 08/12/2015] QUEtiapine (SEROQUEL XR) 24 hr tablet 400 mg  400 mg Oral Q2000 Riah Kehoe B Verdene Creson, MD        Lab Results:  Results for orders placed or performed during the hospital encounter of 08/09/15 (from the past 48 hour(s))  Glucose, capillary     Status: Abnormal   Collection Time: 08/10/15  6:53 AM  Result Value Ref Range   Glucose-Capillary 122 (H) 65 - 99 mg/dL  Hemoglobin A1c     Status: None   Collection Time: 08/10/15  7:45 AM  Result Value Ref Range   Hgb A1c MFr Bld 5.4 4.0 - 6.0 %  Lipid panel, fasting     Status: None   Collection Time: 08/10/15  7:45 AM  Result Value Ref Range   Cholesterol 159 0 - 200 mg/dL   Triglycerides 122 <150 mg/dL   HDL 48 >40 mg/dL   Total CHOL/HDL Ratio 3.3 RATIO   VLDL 24 0 - 40 mg/dL   LDL Cholesterol 87 0 - 99 mg/dL    Comment:        Total Cholesterol/HDL:CHD Risk Coronary Heart Disease Risk Table                     Men   Women  1/2 Average Risk   3.4   3.3  Average Risk       5.0   4.4  2 X Average Risk   9.6   7.1  3 X Average Risk  23.4   11.0        Use the calculated Patient Ratio above and the CHD Risk Table to determine the patient's CHD Risk.        ATP III CLASSIFICATION (LDL):  <100     mg/dL   Optimal  100-129  mg/dL   Near or Above                    Optimal  130-159  mg/dL   Borderline  160-189  mg/dL   High  >190     mg/dL   Very High   TSH     Status: None   Collection Time: 08/10/15  7:45 AM  Result Value Ref Range   TSH 0.930 0.350 - 4.500 uIU/mL  Prolactin     Status: None   Collection Time: 08/10/15  7:45 AM  Result Value Ref Range   Prolactin 5.7 4.8 - 23.3 ng/mL    Comment: (NOTE) Performed At: [facility] [place], [place] [id number] Lindon Romp MD [id number]    Glucose, capillary     Status: Abnormal   Collection Time: 08/10/15 11:49 AM  Result Value Ref Range   Glucose-Capillary 136 (H) 65 - 99 mg/dL  Urinalysis complete, with microscopic ([facility] only)     Status: Abnormal   Collection Time: 08/10/15  2:25 PM  Result Value Ref Range   Color, Urine COLORLESS (A) YELLOW   APPearance CLEAR (A) CLEAR   Glucose, UA NEGATIVE NEGATIVE mg/dL   Bilirubin Urine NEGATIVE NEGATIVE   Ketones, ur NEGATIVE NEGATIVE mg/dL   Specific Gravity, Urine 1.005 1.005 - 1.030   Hgb urine dipstick NEGATIVE NEGATIVE   pH 5.0 5.0 - 8.0   Protein, ur NEGATIVE NEGATIVE mg/dL   Nitrite NEGATIVE NEGATIVE   Leukocytes, UA 2+ (A) NEGATIVE   RBC / HPF 0-5 0 - 5 RBC/hpf   WBC, UA 6-30 0 -  5 WBC/hpf   Bacteria, UA RARE (A) NONE SEEN   Squamous Epithelial / LPF 0-5 (A) NONE SEEN   Mucous PRESENT   Glucose, capillary     Status: None   Collection Time: 08/10/15  4:41 PM  Result Value Ref Range   Glucose-Capillary 92 65 - 99 mg/dL  Glucose, capillary     Status: None   Collection Time: 08/10/15  8:35 PM  Result Value Ref Range   Glucose-Capillary 87 65 - 99 mg/dL  Glucose, capillary     Status: None   Collection Time: 08/11/15  6:44 AM  Result Value Ref Range   Glucose-Capillary 89 65 - 99 mg/dL  Glucose, capillary     Status: Abnormal   Collection Time: 08/11/15 11:32 AM  Result Value Ref Range   Glucose-Capillary 106 (H) 65 - 99 mg/dL   Comment 1 Notify RN   Glucose, capillary     Status: None   Collection Time: 08/11/15  4:35 PM  Result Value Ref Range   Glucose-Capillary 89 65 - 99 mg/dL   Comment 1 Notify RN     Blood Alcohol level:  Lab Results  Component Value Date   ETH <5 08/08/2015   ETH <5 07/18/2015    Physical Findings: AIMS: Facial and Oral Movements Muscles of Facial Expression: None, normal Lips and Perioral Area: None, normal Jaw: None, normal Tongue: None, normal,Extremity Movements Upper (arms, wrists, hands, fingers): None,  normal Lower (legs, knees, ankles, toes): None, normal, Trunk Movements Neck, shoulders, hips: None, normal, Overall Severity Severity of abnormal movements (highest score from questions above): None, normal Incapacitation due to abnormal movements: None, normal Patient's awareness of abnormal movements (rate only patient's report): No Awareness, Dental Status Current problems with teeth and/or dentures?: No Does patient usually wear dentures?: No  CIWA:    COWS:     Musculoskeletal: Strength & Muscle Tone: within normal limits Gait & Station: normal Patient leans: N/A  Psychiatric Specialty Exam: Review of Systems  Psychiatric/Behavioral: Positive for depression and suicidal ideas. The patient has insomnia.   All other systems reviewed and are negative.   Blood pressure 155/97, pulse 91, temperature 98.3 F (36.8 C), temperature source Oral, resp. rate 20, height 5\' 4"  (1.626 m), weight 103.42 kg (228 lb).Body mass index is 39.12 kg/(m^2).  General Appearance: Casual  Eye Contact::  Fair  Speech:  Clear and Coherent  Volume:  Normal  Mood:  Depressed  Affect:  Appropriate  Thought Process:  Goal Directed  Orientation:  Full (Time, Place, and Person)  Thought Content:  WDL  Suicidal Thoughts:  Yes.  with intent/plan  Homicidal Thoughts:  No  Memory:  Immediate;   Fair Recent;   Fair Remote;   Fair  Judgement:  Poor  Insight:  Shallow  Psychomotor Activity:  Normal  Concentration:  Fair  Recall:  [employer] of Knowledge:Fair  Language: Fair  Akathisia:  No  Handed:  Right  AIMS (if indicated):     Assets:  Communication Skills Desire for Improvement Resilience  ADL's:  Intact  Cognition: WNL  Sleep:  Number of Hours: 7.15   Treatment Plan Summary: Daily contact with patient to assess and evaluate symptoms and progress in treatment and Medication management   Maureen Swanson is a 50 year old female with history of schizoaffective disorder and multiple medical problems  admitted for worsening of depression and suicidal ideation in the context of severe social stressors treatment noncompliance.  1. Suicidal ideation. The patient is able to  contract for safety in the hospital.   2. Mood. The patient has been started on Abilify maintena during her previous hospitalization. Next injection on March 13. We increased oral Abilify to 10 mg and Seroquel to 400 mg along with Trileptal for mood stabilization. The patient was also started on Lamictal but has been noncompliant.   3. Hypertension. We will continue Norvasc and furosemide.  4. Diabetes. We'll continue metformin, Tradjenta, ADA diet and blood glucose monitoring and sliding scale insulin.  5. Diabetic neuropathy. Will continue Neurontin.  6. Dyslipidemia. We'll continue Lipitor.  7. Arthritis. We'll continue Celebrex.  8. Alcohol use. The patient admits to some drinking. She does not think she needs detox. Vital signs are stable. There are no symptoms of withdrawal.  9. Cocaine abuse. The patient denies daily cocaine use.  10. Substance abuse treatment. She is not interested at present.  11. Smoking. Nicotine patches available.  12. Insomnia. She has Seroquel at bedtime. Will offer prn restoril.  13. Disposition. She will be discharged to the homeless shelter. She will [facility] up with [facility] where she receives free medications.  Orson Slick, MD 08/11/2015, 8:16 PM

## 2015-08-12 LAB — GLUCOSE, CAPILLARY
GLUCOSE-CAPILLARY: 93 mg/dL (ref 65–99)
GLUCOSE-CAPILLARY: 98 mg/dL (ref 65–99)
GLUCOSE-CAPILLARY: 106 mg/dL — AB (ref 65–99)
Glucose-Capillary: 125 mg/dL — ABNORMAL HIGH (ref 65–99)

## 2015-08-12 MED ORDER — TEMAZEPAM 15 MG PO CAPS
15.0000 mg | ORAL_CAPSULE | Freq: Every day | ORAL | Status: DC
  Administered 2015-08-12 – 2015-08-13 (×2): 15 mg via ORAL
  Filled 2015-08-12 (×2): qty 1

## 2015-08-12 NOTE — Progress Notes (Addendum)
[facility] MD Progress Note  08/12/2015 3:06 PM Maureen Swanson  MRN:  [id number]  Subjective:  Mrs. Maureen Swanson still feels depressed, anxious, and suicidal. She reports auditory hallucinations. The voices are telling her to step in front of the traffic. She tolerates medications well. There are no somatic complaints. She goes to groups.  Principal Problem: Schizoaffective disorder, bipolar type ([place]) Diagnosis:   Patient Active Problem List   Diagnosis Date Noted  . Tobacco use disorder [F17.200] 07/20/2015  . Diabetes mellitus ([place]) [E11.9] 07/20/2015  . Suicidal ideation [R45.851]   . Cocaine use disorder, moderate, dependence ([place]) [F14.20] 04/03/2015  . Schizoaffective disorder, bipolar type ([place]) [F25.0] 04/03/2015  . Alcohol use disorder, moderate, dependence ([place]) [F10.20] 04/03/2015  . Acute ischemic stroke ([place]) [I63.9] 12/08/2014  . Left-sided weakness [M62.89] 12/08/2014  . 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 Knee pain [M25.569] 10/17/2010  . MAMMOGRAM, ABNORMAL, RIGHT [R92.8] 08/05/2010  . Dyslipidemia [E78.5] 06/21/2010  . AMENORRHEA, SECONDARY [N91.2] 10/29/2009  . 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 GERD [K21.9] 10/11/2007  . Essential hypertension [I10] 02/26/2007   Total Time spent with patient: 20 minutes  Past Psychiatric History: Schizoaffective disorder. Substance abuse.  Past Medical History:  Past Medical History  Diagnosis Date  . High cholesterol   . Depression   . Gout   . Anxiety   . Bipolar 1 disorder ([place])   . GERD (gastroesophageal reflux disease)   . Substance abuse     crack cocaine  . Headache(784.0)     migraines  . TMJ (temporomandibular joint disorder)   . Asthma     daily and prn inhalers  . Arthritis     back and knees  . Gastric ulcer   . 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 Hypertension     under control, has been on med. x 12 yrs.  . Diabetes mellitus     diet-controlled  . CHF (congestive  heart failure) [facility])     Past Surgical History  Procedure Laterality Date  . Knee arthroscopy w/ partial medial meniscectomy  05/01/2010    right  . Breast lumpectomy with needle localization  04/19/2012    Procedure: BREAST LUMPECTOMY WITH NEEDLE LOCALIZATION;  Surgeon: [name], MD;  Location: [place];  Service: General;  Laterality: Right;   Family History:  Family History  Problem Relation Age of Onset  . Diabetes Mother   . Breast cancer Mother   . Cancer Father   . Bipolar disorder Maternal Aunt   . Schizophrenia Maternal Grandfather   . Alcoholism Maternal Uncle    Family Psychiatric  History: See H&P. Social History:  History  Alcohol Use  . 0.0 oz/week    Comment: reports she drinks 3 40 ounce beers daily     History  Drug Use  . Yes  . Special: Cocaine    Comment: last use 1/30 (07/18/15)    Social History   Social History  . Marital Status: Single    Spouse Name: N/A  . Number of Children: N/A  . Years of Education: N/A   Social History Main Topics  . Smoking status: Current Every Day Smoker -- 0.50 packs/day for 15 years    Types: Cigarettes    Last Attempt to Quit: 08/02/2012  . Smokeless tobacco: Never Used  . Alcohol Use: 0.0 oz/week     Comment: reports she drinks 3 40 ounce beers daily  . Drug  Use: Yes    Special: Cocaine     Comment: last use 1/30 (07/18/15)  . Sexual Activity: No   Other Topics Concern  . None   Social History Narrative   Additional Social History:    Pain Medications: see PTA meds Prescriptions: see PTA meds Over the Counter: see PTA meds History of alcohol / drug use?: Yes                    Sleep: Poor  Appetite:  Good  Current Medications: Current Facility-Administered Medications  Medication Dose Route Frequency Provider Last Rate Last Dose  . acetaminophen (TYLENOL) tablet 650 mg  650 mg Oral Q6H PRN Benjamine Mola, FNP      . albuterol (PROVENTIL HFA;VENTOLIN HFA) 108 (90  Base) MCG/ACT inhaler 2 puff  2 puff Inhalation Q6H PRN Benjamine Mola, FNP      . alum & mag hydroxide-simeth (MAALOX/MYLANTA) 200-200-20 MG/5ML suspension 30 mL  30 mL Oral Q4H PRN Benjamine Mola, FNP      . amLODipine (NORVASC) tablet 5 mg  5 mg Oral Daily Benjamine Mola, FNP   5 mg at 08/12/15 0930  . ARIPiprazole (ABILIFY) tablet 10 mg  10 mg Oral Daily Layne Dilauro B Samuella Rasool, MD   10 mg at 08/12/15 0930  . [START ON 08/27/2015] ARIPiprazole SUSR 400 mg  400 mg Intramuscular Q28 days Umaiza Matusik B Fiza Nation, MD      . aspirin chewable tablet 81 mg  81 mg Oral Daily Benjamine Mola, FNP   81 mg at 08/12/15 0929  . atorvastatin (LIPITOR) tablet 40 mg  40 mg Oral QPM Benjamine Mola, FNP   40 mg at 08/11/15 1658  . calcium carbonate (TUMS - dosed in mg elemental calcium) chewable tablet 500 mg of elemental calcium  2.5 tablet Oral Q breakfast Benjamine Mola, FNP   500 mg of elemental calcium at 08/12/15 0928  . celecoxib (CELEBREX) capsule 100 mg  100 mg Oral BID Benjamine Mola, FNP   100 mg at 08/12/15 [id number]  . furosemide (LASIX) tablet 40 mg  40 mg Oral Daily Benjamine Mola, FNP   40 mg at 08/12/15 0929  . gabapentin (NEURONTIN) capsule 400 mg  400 mg Oral BID Benjamine Mola, FNP   400 mg at 08/12/15 [id number]  . hydrOXYzine (ATARAX/VISTARIL) tablet 25 mg  25 mg Oral Q6H PRN Benjamine Mola, FNP   25 mg at 08/12/15 0930  . insulin aspart (novoLOG) injection 0-5 Units  0-5 Units Subcutaneous QHS Benjamine Mola, FNP   0 Units at 08/10/15 2237  . lamoTRIgine (LAMICTAL) tablet 25 mg  25 mg Oral Daily Benjamine Mola, FNP   25 mg at 08/12/15 0930  . linagliptin (TRADJENTA) tablet 5 mg  5 mg Oral Daily Benjamine Mola, FNP   5 mg at 08/12/15 0930  . magnesium hydroxide (MILK OF MAGNESIA) suspension 30 mL  30 mL Oral Daily PRN Benjamine Mola, FNP      . metFORMIN (GLUCOPHAGE) tablet 1,000 mg  1,000 mg Oral BID WC Benjamine Mola, FNP   1,000 mg at 08/12/15 0929  . nicotine (NICODERM CQ - dosed in mg/24 hours) patch 21  mg  21 mg Transdermal Daily Clovis Fredrickson, MD   21 mg at 08/12/15 0927  . Oxcarbazepine (TRILEPTAL) tablet 300 mg  300 mg Oral BID Benjamine Mola, FNP   300 mg at 08/12/15 [id number]  . pantoprazole (  PROTONIX) EC tablet 40 mg  40 mg Oral BID AC Benjamine Mola, FNP   40 mg at 08/12/15 0641  . QUEtiapine (SEROQUEL XR) 24 hr tablet 400 mg  400 mg Oral Q2000 Alaynna Kerwood B Nahome Bublitz, MD      . temazepam (RESTORIL) capsule 15 mg  15 mg Oral QHS PRN Clovis Fredrickson, MD   15 mg at 08/11/15 2151    Lab Results:  Results for orders placed or performed during the hospital encounter of 08/09/15 (from the past 48 hour(s))  Glucose, capillary     Status: None   Collection Time: 08/10/15  4:41 PM  Result Value Ref Range   Glucose-Capillary 92 65 - 99 mg/dL  Glucose, capillary     Status: None   Collection Time: 08/10/15  8:35 PM  Result Value Ref Range   Glucose-Capillary 87 65 - 99 mg/dL  Glucose, capillary     Status: None   Collection Time: 08/11/15  6:44 AM  Result Value Ref Range   Glucose-Capillary 89 65 - 99 mg/dL  Glucose, capillary     Status: Abnormal   Collection Time: 08/11/15 11:32 AM  Result Value Ref Range   Glucose-Capillary 106 (H) 65 - 99 mg/dL   Comment 1 Notify RN   Glucose, capillary     Status: None   Collection Time: 08/11/15  4:35 PM  Result Value Ref Range   Glucose-Capillary 89 65 - 99 mg/dL   Comment 1 Notify RN   Glucose, capillary     Status: None   Collection Time: 08/11/15  8:34 PM  Result Value Ref Range   Glucose-Capillary 83 65 - 99 mg/dL   Comment 1 Notify RN   Glucose, capillary     Status: None   Collection Time: 08/12/15  7:17 AM  Result Value Ref Range   Glucose-Capillary 93 65 - 99 mg/dL   Comment 1 Notify RN   Glucose, capillary     Status: Abnormal   Collection Time: 08/12/15 11:49 AM  Result Value Ref Range   Glucose-Capillary 125 (H) 65 - 99 mg/dL    Blood Alcohol level:  Lab Results  Component Value Date   ETH <5 08/08/2015   ETH <5  07/18/2015    Physical Findings: AIMS: Facial and Oral Movements Muscles of Facial Expression: None, normal Lips and Perioral Area: None, normal Jaw: None, normal Tongue: None, normal,Extremity Movements Upper (arms, wrists, hands, fingers): None, normal Lower (legs, knees, ankles, toes): None, normal, Trunk Movements Neck, shoulders, hips: None, normal, Overall Severity Severity of abnormal movements (highest score from questions above): None, normal Incapacitation due to abnormal movements: None, normal Patient's awareness of abnormal movements (rate only patient's report): No Awareness, Dental Status Current problems with teeth and/or dentures?: No Does patient usually wear dentures?: No  CIWA:    COWS:     Musculoskeletal: Strength & Muscle Tone: within normal limits Gait & Station: normal Patient leans: N/A  Psychiatric Specialty Exam: Review of Systems  Psychiatric/Behavioral: Positive for depression, suicidal ideas, hallucinations and substance abuse. The patient has insomnia.   All other systems reviewed and are negative.   Blood pressure 144/90, pulse 98, temperature 98.8 F (37.1 C), temperature source Oral, resp. rate 20, height 5\' 4"  (1.626 m), weight 103.42 kg (228 lb).Body mass index is 39.12 kg/(m^2).  General Appearance: Casual  Eye Contact::  Good  Speech:  Clear and Coherent  Volume:  Normal  Mood:  Depressed  Affect:  Blunt  Thought Process:  Goal Directed  Orientation:  Full (Time, Place, and Person)  Thought Content:  Hallucinations: Auditory Command:  , Her to step in front of the truck.  Suicidal Thoughts:  Yes.  with intent/plan  Homicidal Thoughts:  No  Memory:  Immediate;   Fair Recent;   Fair Remote;   Fair  Judgement:  Poor  Insight:  Lacking  Psychomotor Activity:  Normal  Concentration:  Fair  Recall:  [employer] of Knowledge:Fair  Language: Fair  Akathisia:  No  Handed:  Right  AIMS (if indicated):     Assets:  Communication  Skills Desire for Improvement Financial Resources/Insurance Resilience Social Support  ADL's:  Intact  Cognition: WNL  Sleep:  Number of Hours: 7.75   Treatment Plan Summary: Daily contact with patient to assess and evaluate symptoms and progress in treatment and Medication management   Mrs. Maureen Swanson is a 50 year old female with history of schizoaffective disorder and multiple medical problems admitted for worsening of depression and suicidal ideation in the context of severe social stressors treatment noncompliance.  1. Suicidal ideation. The patient is able to contract for safety in the hospital.   2. Mood. The patient has been started on Abilify maintena during her previous hospitalization. Next injection on March 13. We increased oral Abilify to 10 mg and Seroquel to 400 mg along with Trileptal for mood stabilization. The patient was also started on Lamictal but has been noncompliant.   3. Hypertension. We will continue Norvasc and furosemide.  4. Diabetes. We'll continue metformin, Tradjenta, ADA diet and blood glucose monitoring and sliding scale insulin.  5. Diabetic neuropathy. Will continue Neurontin.  6. Dyslipidemia. We'll continue Lipitor.  7. Arthritis. We'll continue Celebrex.  8. Alcohol use. The patient admits to some drinking. She does not think she needs detox. Vital signs are stable. There are no symptoms of withdrawal.  9. Cocaine abuse. The patient denies daily cocaine use.  10. Substance abuse treatment. She is not interested at present.  11. Smoking. Nicotine patches available.  12. Insomnia. She has Seroquel at bedtime but wants it increased to 400 mg. Will offer prn restoril.  13. Disposition. She will be discharged to the homeless shelter. She will follow up with [facility] where she receives free medications.   Orson Slick, MD 08/12/2015, 3:06 PM

## 2015-08-12 NOTE — Progress Notes (Signed)
D: Observed pt in the day room. Patient alert and oriented x4. Patient denies SI/HI/VH. Pt endorsed auditory hallucinations before pm shift telling her "not worthy, hurt myself, hurt others" but pt denied this evening.  Pt affect depressed and anxious. Pt rated anxiety 8/10 and depression 8/10. Pt stated her mood was "fair...it's better than it has been." Pt primary complaint was insomnia and anxiety. Pt voiced no other complaints. A: Offered active listening and support. Provided therapeutic communication. Administered scheduled medications. Gave restoril and vistaril prn. R: Pt pleasant and cooperative. Pt medication compliant. Will continue Q15 min. checks. Safety maintained.

## 2015-08-12 NOTE — BHH Group Notes (Signed)
[place] Group Notes:  (Nursing/MHT/Case Management/Adjunct)  Date:  08/12/2015  Time:  11:59 AM  Type of Therapy:  Psychoeducational Skills  Participation Level:  Active  Participation Quality:  Appropriate, Attentive and Sharing  Affect:  Appropriate  Cognitive:  Alert and Appropriate  Insight:  Appropriate  Engagement in Group:  Engaged  Modes of Intervention:  Discussion, Education and Support  Summary of Progress/Problems:  Adela Lank Shateria Paternostro 08/12/2015, 11:59 AM

## 2015-08-12 NOTE — Plan of Care (Signed)
Problem: Alteration in mood Goal: LTG-Patient reports reduction in suicidal thoughts (Patient reports reduction in suicidal thoughts and is able to verbalize a safety plan for whenever patient is feeling suicidal)  Outcome: Progressing Pt denies SI     

## 2015-08-12 NOTE — Progress Notes (Addendum)
Patient with sad affect, cooperative behavior with meals, meds and plan of care. No SI/HI at this time. Endorses AV and is aware voices are not real. Quiet with peers, verbalizes needs well with staff. Safety maintained. MD into visit. Completes daily audit with working on managing anxiety and learning to manage Self harming thoughts. No distress, no complaint.

## 2015-08-12 NOTE — Plan of Care (Signed)
Problem: Consults Goal: Depression Patient Education See Patient Education Module for education specifics.  Outcome: Progressing Cooperative with meds and plan of care.

## 2015-08-12 NOTE — BHH Group Notes (Signed)
[place] Group Notes:  (Nursing/MHT/Case Management/Adjunct)  Date:  08/12/2015  Time:  12:31 AM  Type of Therapy:  Group Therapy  Participation Level:  Active  Participation Quality:  Appropriate and Sharing  Affect:  Appropriate  Cognitive:  Appropriate  Insight:  Appropriate  Engagement in Group:  Engaged  Modes of Intervention:  Support  Summary of Progress/Problems:  Maureen Swanson 08/12/2015, 12:31 AM

## 2015-08-12 NOTE — BHH Group Notes (Signed)
[place] LCSW Group Therapy  08/12/2015 3:56 PM  Type of Therapy:  Group Therapy  Participation Level:  Minimal  Participation Quality:  Attentive  Affect:  Flat  Cognitive:  Alert  Insight:  Limited  Engagement in Therapy:  Limited  Modes of Intervention:  Discussion, Education, Socialization and Support  Summary of Progress/Problems: Pts were asked to identify what balance means to them. They were encouraged to identify what throws them off balance and how to regain balance.  Pt attended group and stayed the entire time. She sat quietly and listened to other group members.   [place] MSW, [place]  08/12/2015, 3:56 PM

## 2015-08-13 LAB — URINE CULTURE: Special Requests: NORMAL

## 2015-08-13 LAB — GLUCOSE, CAPILLARY
Glucose-Capillary: 127 mg/dL — ABNORMAL HIGH (ref 65–99)
Glucose-Capillary: 110 mg/dL — ABNORMAL HIGH (ref 65–99)
Glucose-Capillary: 104 mg/dL — ABNORMAL HIGH (ref 65–99)
Glucose-Capillary: 107 mg/dL — ABNORMAL HIGH (ref 65–99)

## 2015-08-13 MED ORDER — QUETIAPINE FUMARATE ER 50 MG PO TB24
200.0000 mg | ORAL_TABLET | Freq: Every day | ORAL | Status: DC
  Administered 2015-08-13: 200 mg via ORAL
  Filled 2015-08-13: qty 4

## 2015-08-13 NOTE — BHH Group Notes (Signed)
[place] LCSW Group Therapy  08/13/2015 2:12 PM  Type of Therapy:  Group Therapy  Participation Level:  Did Not Attend  Summary of Progress/Problems: Patient was called to group but did not attend.  Keene Breath, MSW, LCSWA 08/13/2015, 2:12 PM

## 2015-08-13 NOTE — Plan of Care (Signed)
Problem: [facility] Participation in Recreation Therapeutic Interventions Goal: STG-Patient will identify at least five coping skills for ** STG: Coping Skills - Within 4 treatment sessions, patient will verbalize at least 5 coping skills for substance abuse in each of 2 treatment sessions to decrease substance abuse post d/c.  Outcome: Progressing Treatment Session 1; Completed 1 out of 2: At approximately 12:35 pm, LRT met with patient in patient room. Patient verbalized 5 coping skills for substance abuse. LRT educated patient on leisure and why it is important to implement it into her schedule. LRT educated and provided patient with blank schedules to help her plan her day and try to avoid using substances. LRT educated patient on healthy support systems. Intervention Used: Coping Skills worksheet  Leonette Monarch, LRT/CTRS 02.27.17 1:50 pm Goal: STG-Other Recreation Therapy Goal (Specify) STG: Stress Management - Within 4 treatment sessions, patient will verbalize understanding of the stress management techniques in each of 2 treatment sessions to increase stress management skills post d/c.  Outcome: Progressing Treatment Session 1; Completed 1 out of 2: At approximately 12:35 pm, LRT met with patient in patient room. LRT educated and provided patient with handouts on stress management techniques. Patient verbalized understanding. LRT encouraged patient to read over and practice the stress management techniques. Intervention Used: Stress Management techniques  Leonette Monarch, LRT/CTRS 02.27.17 1:52 pm

## 2015-08-13 NOTE — Progress Notes (Signed)
Patient reports feeling groggy and dizzy this am, 0700.  Patient's vitals were WNL with the exception of slight tachycardia 131 sitting, 142 standing.  At snack time, patient reported that her "head is spinning"  Patient was escorted to her bed and encouraged to call for help before she gets up out of bed.  Patient has a walker in her room.  MD was notified.

## 2015-08-13 NOTE — Progress Notes (Signed)
D: Pt denies SI/HI/AVH. Pt is pleasant and cooperative, affect is flat and sad but brightens upon approach, no distress noted.  Pt appears less anxious and she is interacting with peers and staff appropriately.  A: Pt was offered support and encouragement. Pt was given scheduled medications. Pt was encouraged to attend groups. Q 15 minute checks were done for safety.  R:Pt attends groups and interacts well with peers and staff. Pt is taking medication. Pt has no complaints.Pt receptive to treatment and safety maintained on unit.

## 2015-08-13 NOTE — Progress Notes (Signed)
D:  Patient is alert and oriented on the unit this shift.  Patient was feeling groggy this am.  Patient rested in her room and did not attend groups today.  Patient denies suicidal ideation, homicidal ideation, auditory or visual hallucinations at the present time.   A:  Scheduled medications are administered to patient as per MD orders.  Emotional support and encouragement are provided.  Patient is maintained on q.15 minute safety checks.  Patient is informed to notify staff with questions or concerns. R:  No adverse medication reactions are noted.  Patient is cooperative with medication administration and treatment plan today.  Patient is receptive, calm and cooperative on the unit at this time.  Patient interacts well with others on the unit this shift.  Patient contracts for safety at this time.  Patient remains safe at this time.

## 2015-08-13 NOTE — Plan of Care (Signed)
Problem: Ineffective individual coping Goal: LTG: Patient will report a decrease in negative feelings Outcome: Progressing Pt stated she was feeling a little better today.   Problem: Alteration in mood Goal: LTG-Patient reports reduction in suicidal thoughts (Patient reports reduction in suicidal thoughts and is able to verbalize a safety plan for whenever patient is feeling suicidal)  Outcome: Progressing Pt denies SI at this time

## 2015-08-13 NOTE — Progress Notes (Signed)
As per MD orders, patient's morning medications were held.

## 2015-08-13 NOTE — BHH Group Notes (Signed)
[facility] LCSW Aftercare Discharge Planning Group Note   08/13/2015 12:13 PM  Participation Quality:  Patient was called to group but did not attend.    Keene Breath, MSW, LCSWA

## 2015-08-13 NOTE — BHH Group Notes (Signed)
[place] Group Notes:  (Nursing/MHT/Case Management/Adjunct)  Date:  08/13/2015  Time:  12:36 PM  Type of Therapy:  Psychoeducational Skills  Participation Level:  Did Not Attend   Summary of Progress/Problems:  Maureen Swanson 08/13/2015, 12:36 PM

## 2015-08-13 NOTE — Plan of Care (Signed)
Problem: Ineffective individual coping Goal: STG: Patient will remain free from self harm Outcome: Progressing Patient remains free from self harm currently     

## 2015-08-13 NOTE — Plan of Care (Signed)
Problem: Alteration in thought process Goal: LTG-Patient behavior demonstrates decreased signs psychosis (Patient behavior demonstrates decreased signs of psychosis to the point the patient is safe to return home and continue treatment in an outpatient setting.)  Outcome: Progressing Patient demonstrates decreased psychosis.

## 2015-08-13 NOTE — BHH Suicide Risk Assessment (Signed)
[place] INPATIENT:  Family/Significant Other Suicide Prevention Education  Suicide Prevention Education:  Contact Attempts: pt's boyfriend Tresa Endo at ph: [phone] has been identified by the patient as the family member/significant other with whom the patient will be residing, and identified as the person(s) who will aid the patient in the event of a mental health crisis.  With written consent from the patient, two attempts were made to provide suicide prevention education, prior to and/or following the patient's discharge.  We were unsuccessful in providing suicide prevention education.  A suicide education pamphlet was given to the patient to share with family/significant other.  CSW completed SPE with pt.  Date and time of first attempt:08/13/15 at 4:15pm Date and time of second attempt: 08/14/15 at 9:42am  [place] 08/13/2015, 4:15 PM

## 2015-08-13 NOTE — Progress Notes (Signed)
D: Pt +ve AH- not command, but this is pt baseline, pt hears voices constantly.  denies SI/HI/VH. Pt is pleasant and cooperative. Pt stated she feels better and is not as depressed today. Pt stated was not taking her meds after being D/C from [facility] and the voices became to loud for her to deal with.   A: Pt was offered support and encouragement. Pt was given scheduled medications. Pt was encourage to attend groups. Q 15 minute checks were done for safety.   R:Pt attends groups and interacts well with peers and staff. Pt is taking medication. Pt has no complaints at this time.Pt receptive to treatment and safety maintained on unit.

## 2015-08-13 NOTE — Progress Notes (Signed)
Recreation Therapy Notes  Date: 02.27.17 Time: 3:00 pm Location: Community Room  Group Topic: Wellness  Goal Area(s) Addresses:  Patient will identify at least one item per dimension of health. Patient will examine areas they are deficient in.  Behavioral Response: Attentive  Intervention: 6 Dimensions of Health  Activity: Patients were given a worksheet with the definitions of the 6 Dimensions of Health on it and a worksheet with each dimension written out. Patients were encouraged to write 2-3 things they are currently doing in each dimension.  Education: LRT educated patients on things they can do to improve each area.  Education Outcome: In group clarification offered   Clinical Observations/Feedback: Patient completed activity by writing at least 2 items in each category. Patient did not contribute to group discussion.  Leonette Monarch, LRT/CTRS 08/13/2015 4:03 PM

## 2015-08-13 NOTE — Progress Notes (Signed)
[facility] MD Progress Note  08/13/2015 1:44 PM LAUREN EADDY  MRN:  [id number]  Subjective:  Ms. Gutman complains of weakness today. She has not been able to ambulate without her head spinning. It is likely related to increased Seroquel dose that she received last night. Vital signs are stable except for mild tachycardia [glucose is not. The patient has been taking 100 mg of Seroquel last night she requested 400 mg. We will decrease Seroquel to 200 mg at night. According morning medication that she feels. She reports that hallucinations are from. She still feels depressed, anxious and passive suicidal thoughts. She has not been able to participate in discharge planning..  Principal Problem: Schizoaffective disorder, bipolar type ([place]) Diagnosis:   Patient Active Problem List   Diagnosis Date Noted  . Tobacco use disorder [F17.200] 07/20/2015  . Diabetes mellitus ([place]) [E11.9] 07/20/2015  . Suicidal ideation [R45.851]   . Cocaine use disorder, moderate, dependence ([place]) [F14.20] 04/03/2015  . Schizoaffective disorder, bipolar type ([place]) [F25.0] 04/03/2015  . Alcohol use disorder, moderate, dependence ([place]) [F10.20] 04/03/2015  . Acute ischemic stroke ([place]) [I63.9] 12/08/2014  . Left-sided weakness [M62.89] 12/08/2014  . 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 Knee pain [M25.569] 10/17/2010  . MAMMOGRAM, ABNORMAL, RIGHT [R92.8] 08/05/2010  . Dyslipidemia [E78.5] 06/21/2010  . AMENORRHEA, SECONDARY [N91.2] 10/29/2009  . 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 GERD [K21.9] 10/11/2007  . Essential hypertension [I10] 02/26/2007   Total Time spent with patient: 20 minutes  Past Psychiatric History: Schizoaffective disorder.  Past Medical History:  Past Medical History  Diagnosis Date  . High cholesterol   . Depression   . Gout   . Anxiety   . Bipolar 1 disorder ([place])   . GERD (gastroesophageal reflux disease)   . Substance abuse     crack cocaine  . Headache(784.0)    migraines  . TMJ (temporomandibular joint disorder)   . Asthma     daily and prn inhalers  . Arthritis     back and knees  . Gastric ulcer   . 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 Hypertension     under control, has been on med. x 12 yrs.  . Diabetes mellitus     diet-controlled  . CHF (congestive heart failure) [facility])     Past Surgical History  Procedure Laterality Date  . Knee arthroscopy w/ partial medial meniscectomy  05/01/2010    right  . Breast lumpectomy with needle localization  04/19/2012    Procedure: BREAST LUMPECTOMY WITH NEEDLE LOCALIZATION;  Surgeon: [name], MD;  Location: [facility];  Service: General;  Laterality: Right;   Family History:  Family History  Problem Relation Age of Onset  . Diabetes Mother   . Breast cancer Mother   . Cancer Father   . Bipolar disorder Maternal Aunt   . Schizophrenia Maternal Grandfather   . Alcoholism Maternal Uncle    Family Psychiatric  History: See H&P.  Social History:  History  Alcohol Use  . 0.0 oz/week    Comment: reports she drinks 3 40 ounce beers daily     History  Drug Use  . Yes  . Special: Cocaine    Comment: last use 1/30 (07/18/15)    Social History   Social History  . Marital Status: Single    Spouse Name: N/A  . Number of Children: N/A  . Years of Education: N/A   Social History Main  Topics  . Smoking status: Current Every Day Smoker -- 0.50 packs/day for 15 years    Types: Cigarettes    Last Attempt to Quit: 08/02/2012  . Smokeless tobacco: Never Used  . Alcohol Use: 0.0 oz/week     Comment: reports she drinks 3 40 ounce beers daily  . Drug Use: Yes    Special: Cocaine     Comment: last use 1/30 (07/18/15)  . Sexual Activity: No   Other Topics Concern  . None   Social History Narrative   Additional Social History:    Pain Medications: see PTA meds Prescriptions: see PTA meds Over the Counter: see PTA meds History of alcohol / drug use?:  Yes                    Sleep: Fair  Appetite:  Fair  Current Medications: Current Facility-Administered Medications  Medication Dose Route Frequency Provider Last Rate Last Dose  . acetaminophen (TYLENOL) tablet 650 mg  650 mg Oral Q6H PRN Benjamine Mola, FNP      . albuterol (PROVENTIL HFA;VENTOLIN HFA) 108 (90 Base) MCG/ACT inhaler 2 puff  2 puff Inhalation Q6H PRN Benjamine Mola, FNP      . alum & mag hydroxide-simeth (MAALOX/MYLANTA) 200-200-20 MG/5ML suspension 30 mL  30 mL Oral Q4H PRN Benjamine Mola, FNP      . amLODipine (NORVASC) tablet 5 mg  5 mg Oral Daily Benjamine Mola, FNP   5 mg at 08/12/15 0930  . ARIPiprazole (ABILIFY) tablet 10 mg  10 mg Oral Daily Jolanta B Pucilowska, MD   10 mg at 08/12/15 0930  . [START ON 08/27/2015] ARIPiprazole SUSR 400 mg  400 mg Intramuscular Q28 days Jolanta B Pucilowska, MD      . aspirin chewable tablet 81 mg  81 mg Oral Daily Benjamine Mola, FNP   81 mg at 08/12/15 0929  . atorvastatin (LIPITOR) tablet 40 mg  40 mg Oral QPM Benjamine Mola, FNP   40 mg at 08/12/15 1650  . calcium carbonate (TUMS - dosed in mg elemental calcium) chewable tablet 500 mg of elemental calcium  2.5 tablet Oral Q breakfast Benjamine Mola, FNP   500 mg of elemental calcium at 08/13/15 0818  . celecoxib (CELEBREX) capsule 100 mg  100 mg Oral BID Benjamine Mola, FNP   100 mg at 08/12/15 2152  . furosemide (LASIX) tablet 40 mg  40 mg Oral Daily Benjamine Mola, FNP   40 mg at 08/12/15 [id number]  . gabapentin (NEURONTIN) capsule 400 mg  400 mg Oral BID Benjamine Mola, FNP   400 mg at 08/12/15 2147  . hydrOXYzine (ATARAX/VISTARIL) tablet 25 mg  25 mg Oral Q6H PRN Benjamine Mola, FNP   25 mg at 08/12/15 2146  . insulin aspart (novoLOG) injection 0-5 Units  0-5 Units Subcutaneous QHS Benjamine Mola, FNP   0 Units at 08/10/15 2237  . lamoTRIgine (LAMICTAL) tablet 25 mg  25 mg Oral Daily Benjamine Mola, FNP   25 mg at 08/12/15 0930  . linagliptin (TRADJENTA) tablet 5 mg  5 mg  Oral Daily Benjamine Mola, FNP   5 mg at 08/12/15 0930  . magnesium hydroxide (MILK OF MAGNESIA) suspension 30 mL  30 mL Oral Daily PRN Benjamine Mola, FNP      . metFORMIN (GLUCOPHAGE) tablet 1,000 mg  1,000 mg Oral BID WC Benjamine Mola, FNP   1,000 mg at 08/13/15  0818  . nicotine (NICODERM CQ - dosed in mg/24 hours) patch 21 mg  21 mg Transdermal Daily Clovis Fredrickson, MD   21 mg at 08/12/15 0927  . Oxcarbazepine (TRILEPTAL) tablet 300 mg  300 mg Oral BID Benjamine Mola, FNP   300 mg at 08/12/15 2147  . pantoprazole (PROTONIX) EC tablet 40 mg  40 mg Oral BID AC Benjamine Mola, FNP   40 mg at 08/13/15 0818  . QUEtiapine (SEROQUEL XR) 24 hr tablet 200 mg  200 mg Oral QHS Jolanta B Pucilowska, MD      . temazepam (RESTORIL) capsule 15 mg  15 mg Oral QHS Clovis Fredrickson, MD   15 mg at 08/12/15 2147    Lab Results:  Results for orders placed or performed during the hospital encounter of 08/09/15 (from the past 48 hour(s))  Glucose, capillary     Status: None   Collection Time: 08/11/15  4:35 PM  Result Value Ref Range   Glucose-Capillary 89 65 - 99 mg/dL   Comment 1 Notify RN   Glucose, capillary     Status: None   Collection Time: 08/11/15  8:34 PM  Result Value Ref Range   Glucose-Capillary 83 65 - 99 mg/dL   Comment 1 Notify RN   Glucose, capillary     Status: None   Collection Time: 08/12/15  7:17 AM  Result Value Ref Range   Glucose-Capillary 93 65 - 99 mg/dL   Comment 1 Notify RN   Glucose, capillary     Status: Abnormal   Collection Time: 08/12/15 11:49 AM  Result Value Ref Range   Glucose-Capillary 125 (H) 65 - 99 mg/dL  Glucose, capillary     Status: None   Collection Time: 08/12/15  4:28 PM  Result Value Ref Range   Glucose-Capillary 98 65 - 99 mg/dL   Comment 1 Notify RN   Glucose, capillary     Status: Abnormal   Collection Time: 08/12/15  8:14 PM  Result Value Ref Range   Glucose-Capillary 106 (H) 65 - 99 mg/dL   Comment 1 Notify RN   Glucose, capillary      Status: Abnormal   Collection Time: 08/13/15  6:47 AM  Result Value Ref Range   Glucose-Capillary 127 (H) 65 - 99 mg/dL  Glucose, capillary     Status: Abnormal   Collection Time: 08/13/15 12:10 PM  Result Value Ref Range   Glucose-Capillary 110 (H) 65 - 99 mg/dL    Blood Alcohol level:  Lab Results  Component Value Date   ETH <5 08/08/2015   ETH <5 07/18/2015    Physical Findings: AIMS: Facial and Oral Movements Muscles of Facial Expression: None, normal Lips and Perioral Area: None, normal Jaw: None, normal Tongue: None, normal,Extremity Movements Upper (arms, wrists, hands, fingers): None, normal Lower (legs, knees, ankles, toes): None, normal, Trunk Movements Neck, shoulders, hips: None, normal, Overall Severity Severity of abnormal movements (highest score from questions above): None, normal Incapacitation due to abnormal movements: None, normal Patient's awareness of abnormal movements (rate only patient's report): No Awareness, Dental Status Current problems with teeth and/or dentures?: No Does patient usually wear dentures?: No  CIWA:    COWS:     Musculoskeletal: Strength & Muscle Tone: within normal limits Gait & Station: normal Patient leans: N/A  Psychiatric Specialty Exam: Review of Systems  Cardiovascular: Positive for palpitations.  Neurological: Positive for weakness.  Psychiatric/Behavioral: Positive for depression, suicidal ideas and substance abuse.  All other systems  reviewed and are negative.   Blood pressure 119/75, pulse 109, temperature 98.7 F (37.1 C), temperature source Oral, resp. rate 18, height 5\' 4"  (1.626 m), weight 103.42 kg (228 lb), SpO2 99 %.Body mass index is 39.12 kg/(m^2).  General Appearance: Casual  Eye Contact::  Good  Speech:  Clear and Coherent  Volume:  Normal  Mood:  Anxious  Affect:  Appropriate  Thought Process:  Goal Directed  Orientation:  Full (Time, Place, and Person)  Thought Content:  WDL  Suicidal  Thoughts:  Yes.  with intent/plan  Homicidal Thoughts:  No  Memory:  Immediate;   Fair Recent;   Fair Remote;   Fair  Judgement:  Poor  Insight:  Lacking  Psychomotor Activity:  Normal  Concentration:  Fair  Recall:  [employer] of Knowledge:Fair  Language: Fair  Akathisia:  No  Handed:  Right  AIMS (if indicated):     Assets:  Communication Skills Desire for Improvement Resilience Social Support  ADL's:  Intact  Cognition: WNL  Sleep:  Number of Hours: 6.5   Treatment Plan Summary: Daily contact with patient to assess and evaluate symptoms and progress in treatment and Medication management   Mrs. Tiedemann is a 50 year old female with history of schizoaffective disorder and multiple medical problems admitted for worsening of depression and suicidal ideation in the context of severe social stressors treatment noncompliance.  1. Suicidal ideation. The patient is able to contract for safety in the hospital.   2. Mood. The patient has been started on Abilify maintena during her previous hospitalization. Next injection on March 13. We increased oral Abilify to 10 mg and Seroquel to 400 mg along with Trileptal for mood stabilization. The patient was also started on Lamictal but has been noncompliant.   3. Hypertension. We will continue Norvasc and furosemide.  4. Diabetes. We'll continue metformin, Tradjenta, ADA diet and blood glucose monitoring and sliding scale insulin.  5. Diabetic neuropathy. Will continue Neurontin.  6. Dyslipidemia. We'll continue Lipitor.  7. Arthritis. We'll continue Celebrex.  8. Alcohol use. The patient admits to some drinking. She does not think she needs detox. Vital signs are stable. There are no symptoms of withdrawal.  9. Cocaine abuse. The patient denies daily cocaine use.  10. Substance abuse treatment. She is not interested at present.  11. Smoking. Nicotine patches available.  12. Insomnia. We will order Seroquel to 200 mg at bedtime. She  has  prn restoril available.   13. Disposition. She will be discharged to the homeless shelter. She will follow up with [facility] where she receives free medications.   Orson Slick, MD 08/13/2015, 1:44 PM

## 2015-08-13 NOTE — Plan of Care (Signed)
Problem: Alteration in mood Goal: LTG-Pt's behavior demonstrates decreased signs of depression (Patient's behavior demonstrates decreased signs of depression to the point the patient is safe to return home and continue treatment in an outpatient setting)  Outcome: Progressing Patient reports feeling slightly better today than she did yesterday

## 2015-08-14 LAB — GLUCOSE, CAPILLARY
GLUCOSE-CAPILLARY: 113 mg/dL — AB (ref 65–99)
Glucose-Capillary: 95 mg/dL (ref 65–99)
Glucose-Capillary: 133 mg/dL — ABNORMAL HIGH (ref 65–99)
Glucose-Capillary: 113 mg/dL — ABNORMAL HIGH (ref 65–99)

## 2015-08-14 MED ORDER — HYDROXYZINE HCL 25 MG PO TABS
25.0000 mg | ORAL_TABLET | Freq: Three times a day (TID) | ORAL | Status: DC
  Administered 2015-08-14 – 2015-08-15 (×3): 25 mg via ORAL
  Filled 2015-08-14 (×4): qty 1

## 2015-08-14 MED ORDER — ALBUTEROL SULFATE HFA 108 (90 BASE) MCG/ACT IN AERS: 2.0000 | INHALATION_SPRAY | Freq: Four times a day (QID) | RESPIRATORY_TRACT | Status: DC | PRN

## 2015-08-14 MED ORDER — METFORMIN HCL 1000 MG PO TABS: 1000.0000 mg | ORAL_TABLET | Freq: Two times a day (BID) | ORAL | Status: DC

## 2015-08-14 MED ORDER — HYDROXYZINE HCL 25 MG PO TABS: 25.0000 mg | ORAL_TABLET | Freq: Four times a day (QID) | ORAL | Status: DC | PRN

## 2015-08-14 MED ORDER — ATORVASTATIN CALCIUM 40 MG PO TABS: 40.0000 mg | ORAL_TABLET | Freq: Every evening | ORAL | Status: DC

## 2015-08-14 MED ORDER — LINAGLIPTIN 5 MG PO TABS: 5.0000 mg | ORAL_TABLET | Freq: Every day | ORAL | Status: DC

## 2015-08-14 MED ORDER — QUETIAPINE FUMARATE ER 50 MG PO TB24
400.0000 mg | ORAL_TABLET | Freq: Every day | ORAL | Status: DC
  Administered 2015-08-14: 400 mg via ORAL
  Filled 2015-08-14: qty 8

## 2015-08-14 MED ORDER — GABAPENTIN 400 MG PO CAPS: 400.0000 mg | ORAL_CAPSULE | Freq: Two times a day (BID) | ORAL | Status: DC

## 2015-08-14 MED ORDER — FUROSEMIDE 40 MG PO TABS: 40.0000 mg | ORAL_TABLET | Freq: Every day | ORAL | Status: DC

## 2015-08-14 MED ORDER — AMLODIPINE BESYLATE 5 MG PO TABS: 5.0000 mg | ORAL_TABLET | Freq: Every day | ORAL | Status: DC

## 2015-08-14 MED ORDER — PANTOPRAZOLE SODIUM 40 MG PO TBEC: 40.0000 mg | DELAYED_RELEASE_TABLET | Freq: Two times a day (BID) | ORAL | Status: DC

## 2015-08-14 MED ORDER — OXCARBAZEPINE 300 MG PO TABS: 300.0000 mg | ORAL_TABLET | Freq: Two times a day (BID) | ORAL | Status: DC

## 2015-08-14 MED ORDER — ARIPIPRAZOLE 10 MG PO TABS: 10.0000 mg | ORAL_TABLET | Freq: Every day | ORAL | Status: DC

## 2015-08-14 MED ORDER — ARIPIPRAZOLE ER 400 MG IM SUSR: 400.0000 mg | INTRAMUSCULAR | Status: DC

## 2015-08-14 MED ORDER — ASPIRIN 81 MG PO TABS: 81.0000 mg | ORAL_TABLET | Freq: Every day | ORAL | Status: DC

## 2015-08-14 MED ORDER — CALCIUM CARBONATE 1250 (500 CA) MG PO TABS: 1.0000 | ORAL_TABLET | Freq: Every day | ORAL | Status: DC

## 2015-08-14 MED ORDER — QUETIAPINE FUMARATE ER 400 MG PO TB24: 400.0000 mg | ORAL_TABLET | Freq: Every day | ORAL | Status: DC

## 2015-08-14 MED ORDER — ATORVASTATIN CALCIUM 40 MG PO TABS
40.0000 mg | ORAL_TABLET | Freq: Every evening | ORAL | Status: DC
Start: 2015-08-14 — End: 2015-10-29

## 2015-08-14 NOTE — Progress Notes (Signed)
Recreation Therapy Notes  Date: 02.28.17 Time: 3:00 pm Location: Community Room  Group Topic: Self-expression  Goal Area(s) Addresses:  Patient will effectively use art as a means of self-expression. Patient will recognize positive benefit of self-expression. Patient will be able to identify one emotion experienced during group session. Patient will identify use of art/self-expression as a coping skill.  Behavioral Response: Did not attend  Intervention: Two Faces of Me  Activity: Patients were given a blank face worksheet and instructed to draw or write how they felt when they were admitted to the hospital on one side of the face and draw or write how they want to feel when they are d/c on the other side of the face.  Education: LRT educated group on different forms of self-expression.  Education Outcome: Patient did not attend group.   Clinical Observations/Feedback: Patient did not attend group.  Leonette Monarch, LRT/CTRS 08/14/2015 4:13 PM

## 2015-08-14 NOTE — Progress Notes (Signed)
Patient denies SI, HI, AVH. Reports she is ready for discharge. Pleasant and cooperative with care. Med and group compliant. Appropriate with staff and peers.  Encouragement and support offered. Medications given and received. Encouraged to attend group and verbalize feelings.  Pt receptive and remains safe on unit with q 15 min checks.

## 2015-08-14 NOTE — Plan of Care (Signed)
Problem: Ineffective individual coping Goal: STG: Patient will remain free from self harm Outcome: Progressing No self harm reported or observed     

## 2015-08-14 NOTE — Plan of Care (Signed)
Problem: [facility] Participation in Recreation Therapeutic Interventions Goal: STG-Patient will identify at least five coping skills for ** STG: Coping Skills - Within 4 treatment sessions, patient will verbalize at least 5 coping skills for substance abuse in each of 2 treatment sessions to decrease substance abuse post d/c.  Outcome: Completed/Met Date Met:  08/14/15 Treatment Session 2; Completed 2 out of 2: At approximately 10:45 am, LRT met with patient in patient room. Patient verbalized 5 coping skills for substance abuse. LRT encouraged patient to participate in leisure activities.  Intervention Used: Coping Skills worksheet  Leonette Monarch, LRT/CTRS 02.28.17 11:55 am Goal: STG-Other Recreation Therapy Goal (Specify) STG: Stress Management - Within 4 treatment sessions, patient will verbalize understanding of the stress management techniques in each of 2 treatment sessions to increase stress management skills post d/c.  Outcome: Completed/Met Date Met:  08/14/15 Treatment Session 2; Completed 2 out of 2: At approximately 10:45 am, LRT met with patient in patient room. Patient reported she read over and practiced the stress management techniques. Patient verbalized understanding and reported the techniques were helpful. LRT encouraged patient to continue practicing the stress management techniques. Intervention Used: Stress Management handouts  Leonette Monarch, LRT/CTRS 02.28.17 11:57 am

## 2015-08-14 NOTE — Progress Notes (Signed)
[facility] MD Progress Note  08/14/2015 4:19 PM [name]  MRN:  [id number]  Subjective:  [name] reports further improvement. Her mood is good, affect is bright, no psychosis. She is still anxious about being discharged to an homeless shelter but has no other options. She wants to continue treatment at [facility] for substance abuse. She tolerates medications well. She decided to discontinue several medications as she is taking a lot of medicines has no insurance or income. The Included Lamictal that she is taking consistently, Celebrex. She has no somatic complaints. Vital signs are stable.  Principal Problem: Schizoaffective disorder, bipolar type ([place]) Diagnosis:   Patient Active Problem List   Diagnosis Date Noted  . Tobacco use disorder [F17.200] 07/20/2015  . Diabetes mellitus ([place]) [E11.9] 07/20/2015  . Suicidal ideation [R45.851]   . Cocaine use disorder, moderate, dependence ([place]) [F14.20] 04/03/2015  . Schizoaffective disorder, bipolar type ([place]) [F25.0] 04/03/2015  . Alcohol use disorder, moderate, dependence ([place]) [F10.20] 04/03/2015  . Acute ischemic stroke ([place]) [I63.9] 12/08/2014  . Left-sided weakness [M62.89] 12/08/2014  . 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 Knee pain [M25.569] 10/17/2010  . MAMMOGRAM, ABNORMAL, RIGHT [R92.8] 08/05/2010  . Dyslipidemia [E78.5] 06/21/2010  . AMENORRHEA, SECONDARY [N91.2] 10/29/2009  . 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 GERD [K21.9] 10/11/2007  . Essential hypertension [I10] 02/26/2007   Total Time spent with patient: 20 minutes  Past Psychiatric History: Schizoaffective disorder.  Past Medical History:  Past Medical History  Diagnosis Date  . High cholesterol   . Depression   . Gout   . Anxiety   . Bipolar 1 disorder ([place])   . GERD (gastroesophageal reflux disease)   . Substance abuse     crack cocaine  . Headache(784.0)     migraines  . TMJ (temporomandibular joint disorder)   . Asthma     daily and prn  inhalers  . Arthritis     back and knees  . Gastric ulcer   . 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 Hypertension     under control, has been on med. x 12 yrs.  . Diabetes mellitus     diet-controlled  . CHF (congestive heart failure) [facility])     Past Surgical History  Procedure Laterality Date  . Knee arthroscopy w/ partial medial meniscectomy  05/01/2010    right  . Breast lumpectomy with needle localization  04/19/2012    Procedure: BREAST LUMPECTOMY WITH NEEDLE LOCALIZATION;  Surgeon: [name], MD;  Location: [place];  Service: General;  Laterality: Right;   Family History:  Family History  Problem Relation Age of Onset  . Diabetes Mother   . Breast cancer Mother   . Cancer Father   . Bipolar disorder Maternal Aunt   . Schizophrenia Maternal Grandfather   . Alcoholism Maternal Uncle    Family Psychiatric  History: See H&P. Social History:  History  Alcohol Use  . 0.0 oz/week    Comment: reports she drinks 3 40 ounce beers daily     History  Drug Use  . Yes  . Special: Cocaine    Comment: last use 1/30 (07/18/15)    Social History   Social History  . Marital Status: Single    Spouse Name: N/A  . Number of Children: N/A  . Years of Education: N/A   Social History Main Topics  . Smoking status: Current Every Day Smoker -- 0.50 packs/day for 15  years    Types: Cigarettes    Last Attempt to Quit: 08/02/2012  . Smokeless tobacco: Never Used  . Alcohol Use: 0.0 oz/week     Comment: reports she drinks 3 40 ounce beers daily  . Drug Use: Yes    Special: Cocaine     Comment: last use 1/30 (07/18/15)  . Sexual Activity: No   Other Topics Concern  . None   Social History Narrative   Additional Social History:    Pain Medications: see PTA meds Prescriptions: see PTA meds Over the Counter: see PTA meds History of alcohol / drug use?: Yes                    Sleep: Good  Appetite:  Good  Current  Medications: Current Facility-Administered Medications  Medication Dose Route Frequency Provider Last Rate Last Dose  . acetaminophen (TYLENOL) tablet 650 mg  650 mg Oral Q6H PRN Benjamine Mola, FNP      . albuterol (PROVENTIL HFA;VENTOLIN HFA) 108 (90 Base) MCG/ACT inhaler 2 puff  2 puff Inhalation Q6H PRN Benjamine Mola, FNP      . alum & mag hydroxide-simeth (MAALOX/MYLANTA) 200-200-20 MG/5ML suspension 30 mL  30 mL Oral Q4H PRN Benjamine Mola, FNP      . amLODipine (NORVASC) tablet 5 mg  5 mg Oral Daily Benjamine Mola, FNP   5 mg at 08/14/15 0827  . ARIPiprazole (ABILIFY) tablet 10 mg  10 mg Oral Daily Clovis Fredrickson, MD   10 mg at 08/14/15 0828  . [START ON 08/27/2015] ARIPiprazole SUSR 400 mg  400 mg Intramuscular Q28 days Lilias Lorensen B Haneen Bernales, MD      . aspirin chewable tablet 81 mg  81 mg Oral Daily Benjamine Mola, FNP   81 mg at 08/14/15 [id number]  . atorvastatin (LIPITOR) tablet 40 mg  40 mg Oral QPM Benjamine Mola, FNP   40 mg at 08/13/15 1720  . calcium carbonate (TUMS - dosed in mg elemental calcium) chewable tablet 500 mg of elemental calcium  2.5 tablet Oral Q breakfast Benjamine Mola, FNP   500 mg of elemental calcium at 08/14/15 0828  . furosemide (LASIX) tablet 40 mg  40 mg Oral Daily Benjamine Mola, FNP   40 mg at 08/14/15 [id number]  . gabapentin (NEURONTIN) capsule 400 mg  400 mg Oral BID Benjamine Mola, FNP   400 mg at 08/14/15 0827  . hydrOXYzine (ATARAX/VISTARIL) tablet 25 mg  25 mg Oral TID Clovis Fredrickson, MD   25 mg at 08/14/15 1524  . insulin aspart (novoLOG) injection 0-5 Units  0-5 Units Subcutaneous QHS Benjamine Mola, FNP   0 Units at 08/10/15 2237  . linagliptin (TRADJENTA) tablet 5 mg  5 mg Oral Daily Benjamine Mola, FNP   5 mg at 08/14/15 [id number]  . magnesium hydroxide (MILK OF MAGNESIA) suspension 30 mL  30 mL Oral Daily PRN Benjamine Mola, FNP      . metFORMIN (GLUCOPHAGE) tablet 1,000 mg  1,000 mg Oral BID WC Benjamine Mola, FNP   1,000 mg at 08/14/15 0827  .  nicotine (NICODERM CQ - dosed in mg/24 hours) patch 21 mg  21 mg Transdermal Daily Azaryah Oleksy B Tacori Kvamme, MD   21 mg at 08/14/15 1000  . Oxcarbazepine (TRILEPTAL) tablet 300 mg  300 mg Oral BID Benjamine Mola, FNP   300 mg at 08/14/15 0827  . pantoprazole (PROTONIX) EC tablet 40 mg  40 mg Oral BID AC Benjamine Mola, FNP   40 mg at 08/14/15 0827  . QUEtiapine (SEROQUEL XR) 24 hr tablet 400 mg  400 mg Oral QHS Clovis Fredrickson, MD        Lab Results:  Results for orders placed or performed during the hospital encounter of 08/09/15 (from the past 48 hour(s))  Glucose, capillary     Status: None   Collection Time: 08/12/15  4:28 PM  Result Value Ref Range   Glucose-Capillary 98 65 - 99 mg/dL   Comment 1 Notify RN   Glucose, capillary     Status: Abnormal   Collection Time: 08/12/15  8:14 PM  Result Value Ref Range   Glucose-Capillary 106 (H) 65 - 99 mg/dL   Comment 1 Notify RN   Glucose, capillary     Status: Abnormal   Collection Time: 08/13/15  6:47 AM  Result Value Ref Range   Glucose-Capillary 127 (H) 65 - 99 mg/dL  Glucose, capillary     Status: Abnormal   Collection Time: 08/13/15 12:10 PM  Result Value Ref Range   Glucose-Capillary 110 (H) 65 - 99 mg/dL  Glucose, capillary     Status: Abnormal   Collection Time: 08/13/15  4:37 PM  Result Value Ref Range   Glucose-Capillary 104 (H) 65 - 99 mg/dL  Glucose, capillary     Status: Abnormal   Collection Time: 08/13/15  8:25 PM  Result Value Ref Range   Glucose-Capillary 107 (H) 65 - 99 mg/dL  Glucose, capillary     Status: None   Collection Time: 08/14/15  6:44 AM  Result Value Ref Range   Glucose-Capillary 95 65 - 99 mg/dL   Comment 1 Notify RN   Glucose, capillary     Status: Abnormal   Collection Time: 08/14/15 11:42 AM  Result Value Ref Range   Glucose-Capillary 133 (H) 65 - 99 mg/dL    Blood Alcohol level:  Lab Results  Component Value Date   ETH <5 08/08/2015   ETH <5 07/18/2015    Physical Findings: AIMS:  Facial and Oral Movements Muscles of Facial Expression: None, normal Lips and Perioral Area: None, normal Jaw: None, normal Tongue: None, normal,Extremity Movements Upper (arms, wrists, hands, fingers): None, normal Lower (legs, knees, ankles, toes): None, normal, Trunk Movements Neck, shoulders, hips: None, normal, Overall Severity Severity of abnormal movements (highest score from questions above): None, normal Incapacitation due to abnormal movements: None, normal Patient's awareness of abnormal movements (rate only patient's report): No Awareness, Dental Status Current problems with teeth and/or dentures?: No Does patient usually wear dentures?: No  CIWA:    COWS:     Musculoskeletal: Strength & Muscle Tone: within normal limits Gait & Station: normal Patient leans: N/A  Psychiatric Specialty Exam: Review of Systems  All other systems reviewed and are negative.   Blood pressure 124/91, pulse 106, temperature 98.4 F (36.9 C), temperature source Oral, resp. rate 18, height 5\' 4"  (1.626 m), weight 103.42 kg (228 lb), SpO2 99 %.Body mass index is 39.12 kg/(m^2).  General Appearance: Casual  Eye Contact::  Good  Speech:  Clear and Coherent  Volume:  Normal  Mood:  Anxious  Affect:  Appropriate  Thought Process:  Goal Directed  Orientation:  Full (Time, Place, and Person)  Thought Content:  WDL  Suicidal Thoughts:  No  Homicidal Thoughts:  No  Memory:  Immediate;   Fair Recent;   Fair Remote;   Fair  Judgement:  Impaired  Insight:  Shallow  Psychomotor Activity:  Normal  Concentration:  Fair  Recall:  [employer] of Knowledge:Fair  Language: Fair  Akathisia:  No  Handed:  Right  AIMS (if indicated):     Assets:  Communication Skills Desire for Improvement Resilience  ADL's:  Intact  Cognition: WNL  Sleep:  Number of Hours: 9   Treatment Plan Summary: Daily contact with patient to assess and evaluate symptoms and progress in treatment and Medication management    Maureen Swanson is a 50 year old female with history of schizoaffective disorder and multiple medical problems admitted for worsening of depression and suicidal ideation in the context of severe social stressors treatment noncompliance.  1. Suicidal ideation. The patient is able to contract for safety in the hospital.   2. Mood. The patient has been started on Abilify maintena during her previous hospitalization. Next injection on March 13. We increased oral Abilify to 10 mg and Seroquel to 400 mg along with Trileptal for mood stabilization. The patient was also started on Lamictal but has been noncompliant. We discontinued Lamictal.  3. Hypertension. We will continue Norvasc and furosemide.  4. Diabetes. We'll continue metformin, Tradjenta, ADA diet and blood glucose monitoring and sliding scale insulin.  5. Diabetic neuropathy. Will continue Neurontin.  6. Dyslipidemia. We'll continue Lipitor.  7. Arthritis. We discontinued Celebrex  8. Alcohol use. The patient admits to some drinking. She does not think she needs detox. Vital signs are stable. There are no symptoms of withdrawal.  9. Cocaine abuse. The patient denies daily cocaine use.  10. Substance abuse treatment. She is not interested at present.  11. Smoking. Nicotine patches available.  12. Insomnia. We will increase Seroquel to 400 mg at bedtime.   13. Disposition. She will be discharged to the homeless shelter. She will follow up with [facility] where she receives free medications.    Orson Slick, MD 08/14/2015, 4:19 PM

## 2015-08-14 NOTE — BHH Group Notes (Signed)
[facility] Group Notes:  (Nursing/MHT/Case Management/Adjunct)  Date:  08/14/2015  Time:  10:09 PM  Type of Therapy:  Group Therapy  Participation Level:  Active  Participation Quality:  Appropriate  Affect:  Appropriate  Cognitive:  Appropriate  Insight:  Appropriate  Engagement in Group:  Engaged  Modes of Intervention:  Discussion  Summary of Progress/Problems:  Kandis Fantasia 08/14/2015, 10:09 PM
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
## 2015-08-14 NOTE — BHH Group Notes (Signed)
[facility] LCSW Group Therapy  08/14/2015 12:20 PM  Type of Therapy:  Group Therapy  Participation Level:  Minimal  Participation Quality:  Attentive  Affect:  Flat  Cognitive:  Alert and Oriented  Insight:  Improving  Engagement in Therapy:  Lacking  Modes of Intervention:  Socialization and Support  Summary of Progress/Problems: Patient attended and participated in group discussion appropriately introducing herself and sharing during an ice breaker activity the 3 things she would take if she were stranded on a deserted [place] would be "weapons to hunt, a tent, and a net to fish". Patient was attentive but minimal participation and left early after 30 minutes of group.   Keene Breath, MSW, LCSWA 08/14/2015, 12:20 PM

## 2015-08-15 LAB — GLUCOSE, CAPILLARY: Glucose-Capillary: 91 mg/dL (ref 65–99)

## 2015-08-15 NOTE — Progress Notes (Signed)
Recreation Therapy Notes  INPATIENT RECREATION TR PLAN  Patient Details Name: CLARYCE FRIEL MRN: [id number] DOB: 08-23-1965 Today's Date: 08/15/2015  Rec Therapy Plan Is patient appropriate for Therapeutic Recreation?: Yes Treatment times per week: At least once a week TR Treatment/Interventions: 1:1 session, Group participation (Comment) (Appropriate participation in daily recreation therapy tx)  Discharge Criteria Pt will be discharged from therapy if:: Treatment goals are met, Discharged Treatment plan/goals/alternatives discussed and agreed upon by:: Patient/family  Discharge Summary Short term goals set: See Care Plan Short term goals met: Complete Progress toward goals comments: One-to-one attended Which groups?: Wellness, Coping skills One-to-one attended: Stress management, coping skills Reason goals not met: N/A Therapeutic equipment acquired: None Reason patient discharged from therapy: Discharge from hospital Pt/family agrees with progress & goals achieved: Yes Date patient discharged from therapy: 08/15/15   Leonette Monarch, LRT/CTRS 08/15/2015, 2:23 PM
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
## 2015-08-15 NOTE — Progress Notes (Signed)
Patient discharge teaching and instructions discussed with patient. She verbalized understanding. Patient's belongings back to patient and she did not voice any concerns. Patient was escorted to the lobby where a ride was waiting to take her to the bus stop. Patient alert and oriented at the time of D/C.

## 2015-08-15 NOTE — Progress Notes (Addendum)
  [facility] Adult Case Management Discharge Plan :  Will you be returning to the same living situation after discharge:  No.  Pt will seek admittance into a shelter in [place] through the [facility] At discharge, do you have transportation home?: Yes,  pt will take a bus to [place] you have the ability to pay for your medications: Yes,  Pt will be provided with prescriptions at discharge  Release of information consent forms completed and in the chart;  Patient's signature needed at discharge.  Patient to Follow up at: Follow-up Information    Please follow up.   Contact information:          Follow up with [facility] [place].   Why:  Please arrive on Wednesday March 8th at 1pm for therapy with Charolotte Eke for your hospital follow up and assessment for medication managment and therapy     Contact information:   [address] [place], [place] [address] Phone: [phone] Fax: [phone]                   [facility] with [facility] [place].   Why:  Please arrive to the walk-in clinic Mondays wednesday and Fridays between the hours of 9am and 12pm for an assessment for substance abuse treatment, therapy and medication management   Contact information:   [facility] [place] [address] [place], [place] [address] Phone: [phone] Fax: [phone]       Follow up with [facility].   Why:  Please arrive before 3pm to the [facility] for short-term shelter placement and follow up with [facility] about possible long-term housing options.   Contact information:   [facility] [place], [place] [address] Phone: [phone] Fax: [phone]         Next level of care provider has access to [place] and Suicide Prevention discussed: Yes,  completed with pt  Have you used any form of tobacco in the last 30 days?  (Cigarettes, Smokeless Tobacco, Cigars, and/or Pipes): Yes  Has patient been referred to the Quitline?: Patient refused referral  Patient has been referred for addiction treatment: Yes  Maureen Swanson 08/15/2015, 9:18 AM

## 2015-08-15 NOTE — Plan of Care (Signed)
Problem: Ineffective individual coping Goal: STG: Patient will remain free from self harm Outcome: Progressing 15 minute checks maintained for safety, clinical and moral support provided, patient encouraged to continue to express feelings and demonstrate safe care. Patient remain free from harm, will continue to monitor.

## 2015-08-15 NOTE — [facility] Suicide Risk Assessment (Signed)
[facility] Discharge Suicide Risk Assessment   Principal Problem: Schizoaffective disorder, bipolar type [facility]) Discharge Diagnoses:  Patient Active Problem List   Diagnosis Date Noted  . Tobacco use disorder [F17.200] 07/20/2015  . Diabetes mellitus ([place]) [E11.9] 07/20/2015  . Suicidal ideation [R45.851]   . Cocaine use disorder, moderate, dependence ([place]) [F14.20] 04/03/2015  . Schizoaffective disorder, bipolar type ([place]) [F25.0] 04/03/2015  . Alcohol use disorder, moderate, dependence ([place]) [F10.20] 04/03/2015  . Acute ischemic stroke ([place]) [I63.9] 12/08/2014  . Left-sided weakness [M62.89] 12/08/2014  . 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 Knee pain [M25.569] 10/17/2010  . MAMMOGRAM, ABNORMAL, RIGHT [R92.8] 08/05/2010  . Dyslipidemia [E78.5] 06/21/2010  . AMENORRHEA, SECONDARY [N91.2] 10/29/2009  . 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 GERD [K21.9] 10/11/2007  . Essential hypertension [I10] 02/26/2007    Total Time spent with patient: 30 minutes  Musculoskeletal: Strength & Muscle Tone: within normal limits Gait & Station: normal Patient leans: N/A  Psychiatric Specialty Exam: Review of Systems  All other systems reviewed and are negative.   Blood pressure 124/91, pulse 106, temperature 98.4 F (36.9 C), temperature source Oral, resp. rate 18, height 5\' 4"  (1.626 m), weight 103.42 kg (228 lb), SpO2 99 %.Body mass index is 39.12 kg/(m^2).  General Appearance: Casual  Eye Contact::  Good  Speech:  Clear and [id number]  Volume:  Normal  Mood:  Euthymic  Affect:  Appropriate  Thought Process:  Goal Directed  Orientation:  Full (Time, Place, and Person)  Thought Content:  WDL  Suicidal Thoughts:  No  Homicidal Thoughts:  No  Memory:  Immediate;   Fair Recent;   Fair Remote;   Fair  Judgement:  Impaired  Insight:  Shallow  Psychomotor Activity:  Normal  Concentration:  Fair  Recall:  [employer] of Knowledge:Fair  Language: Fair  Akathisia:  No   Handed:  Right  AIMS (if indicated):     Assets:  Communication Skills Desire for Improvement Resilience  Sleep:  Number of Hours: 9  Cognition: WNL  ADL's:  Intact   Mental Status Per Nursing Assessment::   On Admission:  Self-harm thoughts  Demographic Factors:  Divorced or widowed, Low socioeconomic status and Unemployed  Loss Factors: Loss of significant relationship, Decline in physical health and Financial problems/change in socioeconomic status  Historical Factors: Prior suicide attempts and Impulsivity  Risk Reduction Factors:   Sense of responsibility to family and Positive therapeutic relationship  Continued Clinical Symptoms:  Alcohol/Substance Abuse/Dependencies Schizophrenia:   Depressive state  Cognitive Features That Contribute To Risk:  None    Suicide Risk:  Minimal: No identifiable suicidal ideation.  Patients presenting with no risk factors but with morbid ruminations; may be classified as minimal risk based on the severity of the depressive symptoms  Follow-up Information    Follow up with [facility] DO NOT SEND NOT COMPLETE.   Why:  Please arrive for your appointment with your hospital discharge paperwork, your I.D. and proof of insurance if needed.   Contact information:   [facility] DO NOT SEND NOT COMPLETE [address] [place], [place] [address] Phone: [phone] Fax: [phone] Attn: Sophronia Simas      Follow up with [facility] [place].   Why:  Please arrive on Wednesday March 8th at 1pm for therapy with Charolotte Eke for your hospital follow up and assessment for medication managment and therapy     Contact information:   [place],  [place] [address] Phone: [phone] Fax: [phone]                   Follow up with [facility] [place].   Why:  Please arrive to the walk-in clinic Monday and Thursday between the hours of 9am and 11 am for an assessment  for substance abuse treatment, therapy and medication management   Contact information:   [facility] [place] [address] [place], [place] [address] Phone: [phone] Fax: [phone]       Plan Of Care/Follow-up recommendations:  Activity:  as tolerated. Diet:  low sodium heart healthy. Other:  keep follow up appointments.  Orson Slick, MD 08/15/2015, 5:46 AM

## 2015-08-15 NOTE — Progress Notes (Signed)
Observed in the day room watching TV, socializing well with peers, approachable, receptive to a private talk away from peers; denied SI/HI, denied AV/H, disposition uncertain, denied pain.

## 2015-08-15 NOTE — Discharge Summary (Signed)
Physician Discharge Summary Note  Patient:  Maureen Swanson is an 50 y.o., female MRN:  [id number] DOB:  1966-05-02 Patient phone:  [phone] (home)  Patient address:   [place] [address],  Total Time spent with patient: 30 minutes  Date of Admission:  08/09/2015 Date of Discharge: 08/15/2015  Reason for Admission:  Suicidal ideation.  Identifying data. Maureen Swanson is a 50 year old female with a history of schizoaffective disorder and substance use.  Chief complaint. "I had an argument with my boyfriend."  History of present illness. Information was obtained from the patient and the chart. The patient has a long history of depression and mood instability. She was recently hospitalized at [facility]. She was discharged on February 16 after 2 week hospitalization. She returned home to her boyfriend of 9 years. Shortly after they had an argument and he kicked her out. She has been staying here and there. She has not been taking her medication consistently. She became increasingly depressed with poor sleep, decreased appetite, anhedonia, feeling of guilt worthlessness hopelessness, poor energy and concentration, social isolation, crying spells and suicidal thinking. She wanted to cut herself. She attempted suicide in the past by cutting and overdose. She denies psychotic symptoms. She denies symptoms suggestive of bipolar mania. She reports heightened anxiety with panic attacks. She relapsed on alcohol and cocaine even though she does not feel that this is a problem.  Past psychiatric history. Multiple psychiatric admissions. Multiple medication trials. 2 suicide attempts at least by cutting and overdose. She does not remember her past medication trials. She believes that her current regimen is the best she's ever taken. She minimizes substance use.  Family psychiatric history. She has an aunt with bipolar.  Social history. As she used to work as a Development worker, international aid, Pension scheme manager,  Pensions consultant. She is no longer able to work due to physical problems. She applied for disability month ago with the help of her providers and family services. She has a Chief Executive Officer. She is homeless after she split with her boyfriend of 9 years. She has a son but does not get along with his wife.   Principal Problem: Schizoaffective disorder, bipolar type [facility]) Discharge Diagnoses: Patient Active Problem List   Diagnosis Date Noted  . Tobacco use disorder [F17.200] 07/20/2015  . Diabetes mellitus ([place]) [E11.9] 07/20/2015  . Suicidal ideation [R45.851]   . Cocaine use disorder, moderate, dependence ([place]) [F14.20] 04/03/2015  . Schizoaffective disorder, bipolar type ([place]) [F25.0] 04/03/2015  . Alcohol use disorder, moderate, dependence ([place]) [F10.20] 04/03/2015  . Acute ischemic stroke ([place]) [I63.9] 12/08/2014  . Left-sided weakness [M62.89] 12/08/2014  . 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 Knee pain [M25.569] 10/17/2010  . MAMMOGRAM, ABNORMAL, RIGHT [R92.8] 08/05/2010  . Dyslipidemia [E78.5] 06/21/2010  . AMENORRHEA, SECONDARY [N91.2] 10/29/2009  . 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 GERD [K21.9] 10/11/2007  . Essential hypertension [I10] 02/26/2007    Past Psychiatric History: schizoaffective disorder.  Past Medical History:  Past Medical History  Diagnosis Date  . High cholesterol   . Depression   . Gout   . Anxiety   . Bipolar 1 disorder ([place])   . GERD (gastroesophageal reflux disease)   . Substance abuse     crack cocaine  . Headache(784.0)     migraines  . TMJ (temporomandibular joint disorder)   . Asthma     daily and prn inhalers  . Arthritis     back and knees  . Gastric ulcer   .  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 Hypertension     under control, has been on med. x 12 yrs.  . Diabetes mellitus     diet-controlled  . CHF (congestive heart failure) [facility])     Past Surgical History  Procedure Laterality Date  . Knee  arthroscopy w/ partial medial meniscectomy  05/01/2010    right  . Breast lumpectomy with needle localization  04/19/2012    Procedure: BREAST LUMPECTOMY WITH NEEDLE LOCALIZATION;  Surgeon: [name], MD;  Location: [facility];  Service: General;  Laterality: Right;   Family History:  Family History  Problem Relation Age of Onset  . Diabetes Mother   . Breast cancer Mother   . Cancer Father   . Bipolar disorder Maternal Aunt   . Schizophrenia Maternal Grandfather   . Alcoholism Maternal Uncle    Family Psychiatric  History: bipolar disorder. Social History:  History  Alcohol Use  . 0.0 oz/week    Comment: reports she drinks 3 40 ounce beers daily     History  Drug Use  . Yes  . Special: Cocaine    Comment: last use 1/30 (07/18/15)    Social History   Social History  . Marital Status: Single    Spouse Name: N/A  . Number of Children: N/A  . Years of Education: N/A   Social History Main Topics  . Smoking status: Current Every Day Smoker -- 0.50 packs/day for 15 years    Types: Cigarettes    Last Attempt to Quit: 08/02/2012  . Smokeless tobacco: Never Used  . Alcohol Use: 0.0 oz/week     Comment: reports she drinks 3 40 ounce beers daily  . Drug Use: Yes    Special: Cocaine     Comment: last use 1/30 (07/18/15)  . Sexual Activity: No   Other Topics Concern  . None   Social History Narrative    Hospital Course:    Maureen Swanson is a 50 year old female with a history of schizoaffective disorder and multiple medical problems admitted for worsening of depression and suicidal ideation in the context of severe social stressorsand treatment noncompliance.  1. Suicidal ideation. This has respolved. The patient is able to contract for safety. She is forward thinking and more optimistic about the future.   2. Mood. The patient has been started on Abilify maintena during her previous hospitalization. Next injection on March 13. We increased oral Abilify to 10  mg and Seroquel to 400 mg. We kept Trileptal for mood stabilization but discontinued Lamictal due to noncompliance.  3. Hypertension. We continued Norvasc and furosemide.  4. Diabetes. We continued metformin, Tradjenta, ADA diet and blood glucose monitoring.  5. Diabetic neuropathy. We continued Neurontin.  6. Dyslipidemia. We continued Lipitor.  7. Arthritis. We discontinued Celebrex as it was no helpful.  8. Alcohol use. The patient admits to some drinking. She did not require detox. Vital signs were stable.  9. Cocaine abuse. The patient minimizes her problems.   10. Substance abuse treatment. The patient was referred to [facility], residential substance abuse treatment program.   11. Smoking. Nicotine patch was available.  12. Insomnia. She slept better with Seroquel.   13. Disposition. She was discharged to the homeless shelter. She will follow up with [facility] where she receives free medications.  Physical Findings: AIMS: Facial and Oral Movements Muscles of Facial Expression: None, normal Lips and Perioral Area: None, normal Jaw: None, normal Tongue: None,  normal,Extremity Movements Upper (arms, wrists, hands, fingers): None, normal Lower (legs, knees, ankles, toes): None, normal, Trunk Movements Neck, shoulders, hips: None, normal, Overall Severity Severity of abnormal movements (highest score from questions above): None, normal Incapacitation due to abnormal movements: None, normal Patient's awareness of abnormal movements (rate only patient's report): No Awareness, Dental Status Current problems with teeth and/or dentures?: No Does patient usually wear dentures?: No  CIWA:    COWS:     Musculoskeletal: Strength & Muscle Tone: within normal limits Gait & Station: normal Patient leans: N/A  Psychiatric Specialty Exam: Review of Systems  All other systems reviewed and are negative.   Blood pressure 124/91, pulse 106, temperature 98.4 F (36.9 C),  temperature source Oral, resp. rate 18, height 5\' 4"  (1.626 m), weight 103.42 kg (228 lb), SpO2 99 %.Body mass index is 39.12 kg/(m^2).  See SRA.                                                  Sleep:  Number of Hours: 9   Have you used any form of tobacco in the last 30 days? (Cigarettes, Smokeless Tobacco, Cigars, and/or Pipes): Yes  Has this patient used any form of tobacco in the last 30 days? (Cigarettes, Smokeless Tobacco, Cigars, and/or Pipes) Yes, Yes, A prescription for an FDA-approved tobacco cessation medication was offered at discharge and the patient refused  Blood Alcohol level:  Lab Results  Component Value Date   [facility] <5 08/08/2015   ETH <5 [id number]    Metabolic Disorder Labs:  Lab Results  Component Value Date   HGBA1C 5.4 08/10/2015   MPG 120 08/07/2015   MPG 117 07/21/2015   Lab Results  Component Value Date   PROLACTIN 5.7 08/10/2015   PROLACTIN 141.3* 07/21/2015   Lab Results  Component Value Date   CHOL 159 08/10/2015   TRIG 122 08/10/2015   HDL 48 08/10/2015   CHOLHDL 3.3 08/10/2015   VLDL 24 08/10/2015   LDLCALC 87 08/10/2015   LDLCALC 63 07/21/2015    See Psychiatric Specialty Exam and Suicide Risk Assessment completed by Attending Physician prior to discharge.  Discharge destination:  Home  Is patient on multiple antipsychotic therapies at discharge:  Yes,   Do you recommend tapering to monotherapy for antipsychotics?  No   Has Patient had three or more failed trials of antipsychotic monotherapy by history:  Yes,   Antipsychotic medications that previously failed include:   1.  zyprexa., 2.  seroquel. and 3.  abilify.  Recommended Plan for Multiple Antipsychotic Therapies: Additional reason(s) for multiple antispychotic treatment:  poor response to a single agent.  Discharge Instructions    Diet - low sodium heart healthy    Complete by:  As directed      Increase activity slowly    Complete by:  As directed              Medication List    STOP taking these medications        atenolol 100 MG tablet  Commonly known as:  TENORMIN     baclofen 10 MG tablet  Commonly known as:  LIORESAL     benztropine 0.5 MG tablet  Commonly known as:  COGENTIN     celecoxib 100 MG capsule  Commonly known as:  CELEBREX     folic acid 1  MG tablet  Commonly known as:  FOLVITE     hydrochlorothiazide 25 MG tablet  Commonly known as:  HYDRODIURIL     insulin aspart 100 UNIT/ML injection  Commonly known as:  novoLOG     insulin glargine 100 UNIT/ML injection  Commonly known as:  LANTUS     lamoTRIgine 25 MG tablet  Commonly known as:  LAMICTAL     naltrexone 50 MG tablet  Commonly known as:  DEPADE     potassium chloride SA 20 MEQ tablet  Commonly known as:  K-DUR,KLOR-CON     temazepam 15 MG capsule  Commonly known as:  RESTORIL      TAKE these medications      Indication   albuterol 108 (90 Base) MCG/ACT inhaler  Commonly known as:  PROVENTIL HFA;VENTOLIN HFA  Inhale 2 puffs into the lungs every 6 (six) hours as needed for wheezing or shortness of breath.   Indication:  Chronic Obstructive Lung Disease     amLODipine 5 MG tablet  Commonly known as:  NORVASC  Take 1 tablet (5 mg total) by mouth daily.   Indication:  High Blood Pressure     ARIPiprazole 10 MG tablet  Commonly known as:  ABILIFY  Take 1 tablet (10 mg total) by mouth daily.   Indication:  Rapidly Alternating Manic-Depressive Psychosis, mood stabilization     ARIPiprazole 400 MG Susr  Inject 400 mg into the muscle every 28 (twenty-eight) days. Last dose given 07/29/2014.  Next dose due 08/27/2015   Indication:  Schizophrenia     ARIPiprazole 400 MG Susr  Inject 400 mg into the muscle every 28 (twenty-eight) days.  Start taking on:  08/27/2015   Indication:  Schizophrenia     aspirin 81 MG tablet  Take 1 tablet (81 mg total) by mouth daily.   Indication:  Joint Damage causing Pain and Loss of Function, cardiac health      atorvastatin 40 MG tablet  Commonly known as:  LIPITOR  Take 1 tablet (40 mg total) by mouth every evening.   Indication:  Cerebrovascular Accident or Stroke, Elevation of Both Cholesterol and Triglycerides in Blood     calcium carbonate 1250 (500 Ca) MG tablet  Commonly known as:  OS-CAL - dosed in mg of elemental calcium  Take 1 tablet (500 mg of elemental calcium total) by mouth daily with breakfast.   Indication:  Low Amount of Calcium in the Blood     furosemide 40 MG tablet  Commonly known as:  LASIX  Take 1 tablet (40 mg total) by mouth daily.   Indication:  High Blood Pressure     gabapentin 400 MG capsule  Commonly known as:  NEURONTIN  Take 1 capsule (400 mg total) by mouth 2 (two) times daily.   Indication:  Neuropathic Pain, mood stabilization and chronic pain     hydrOXYzine 25 MG tablet  Commonly known as:  ATARAX/VISTARIL  Take 1 tablet (25 mg total) by mouth every 6 (six) hours as needed for anxiety.   Indication:  Anxiety Neurosis     linagliptin 5 MG Tabs tablet  Commonly known as:  TRADJENTA  Take 1 tablet (5 mg total) by mouth daily.   Indication:  Type 2 Diabetes     metFORMIN 1000 MG tablet  Commonly known as:  GLUCOPHAGE  Take 1 tablet (1,000 mg total) by mouth 2 (two) times daily with a meal.   Indication:  Type 2 Diabetes     Oxcarbazepine 300  MG tablet  Commonly known as:  TRILEPTAL  Take 1 tablet (300 mg total) by mouth 2 (two) times daily.   Indication:  Manic-Depression, mood stabilization     pantoprazole 40 MG tablet  Commonly known as:  PROTONIX  Take 1 tablet (40 mg total) by mouth 2 (two) times daily before a meal.   Indication:  Gastroesophageal Reflux Disease     QUEtiapine 400 MG 24 hr tablet  Commonly known as:  SEROQUEL XR  Take 1 tablet (400 mg total) by mouth at bedtime.   Indication:  Manic-Depression, Mood control           Follow-up Information    Follow up with [facility] DO NOT SEND NOT COMPLETE.    Why:  Please arrive for your appointment with your hospital discharge paperwork, your I.D. and proof of insurance if needed.   Contact information:   [facility] DO NOT SEND NOT COMPLETE [address] [place], [place] [address] Phone: [phone] Fax: [phone] Attn: Sophronia Simas      Follow up with [facility].   Why:  Please arrive on Wednesday March 8th at 1pm for therapy with Charolotte Eke for your hospital follow up and assessment for medication managment and therapy     Contact information:   [address] [place], [place] [address] Phone: [phone] Fax: [phone]                   Follow up with [facility].   Why:  Please arrive to the walk-in clinic Monday and Thursday between the hours of 9am and 11 am for an assessment for substance abuse treatment, therapy and medication management   Contact information:   [facility] [address] [place],  [address] Phone: [phone] Fax: [phone]       Follow-up recommendations:  Activity:  as tolerated. Diet:  low sodium heart healthy. Other:  keep follow up appointments.  Comments:     Signed: Orson Slick, MD 08/15/2015, 5:55 AM

## 2015-09-17 ENCOUNTER — Ambulatory Visit: Payer: Self-pay

## 2015-09-18 ENCOUNTER — Ambulatory Visit: Payer: Self-pay | Admitting: Physical Therapy

## 2015-09-30 ENCOUNTER — Emergency Department (HOSPITAL_COMMUNITY)
Admission: EM | Admit: 2015-09-30 | Discharge: 2015-09-30 | Disposition: A | Discharge status: Home / Self Care | Payer: Self-pay | Attending: Emergency Medicine | Admitting: Emergency Medicine

## 2015-09-30 ENCOUNTER — Encounter (HOSPITAL_COMMUNITY): Payer: Self-pay | Admitting: *Deleted

## 2015-09-30 ENCOUNTER — Emergency Department (HOSPITAL_COMMUNITY): Payer: Self-pay

## 2015-09-30 DIAGNOSIS — Z7982 Long term (current) use of aspirin: Secondary | ICD-10-CM | POA: Insufficient documentation

## 2015-09-30 DIAGNOSIS — J45909 Unspecified asthma, uncomplicated: Secondary | ICD-10-CM | POA: Insufficient documentation

## 2015-09-30 DIAGNOSIS — F17210 Nicotine dependence, cigarettes, uncomplicated: Secondary | ICD-10-CM | POA: Insufficient documentation

## 2015-09-30 DIAGNOSIS — Z791 Long term (current) use of non-steroidal anti-inflammatories (NSAID): Secondary | ICD-10-CM | POA: Insufficient documentation

## 2015-09-30 DIAGNOSIS — G43909 Migraine, unspecified, not intractable, without status migrainosus: Secondary | ICD-10-CM | POA: Insufficient documentation

## 2015-09-30 DIAGNOSIS — M47896 Other spondylosis, lumbar region: Secondary | ICD-10-CM | POA: Insufficient documentation

## 2015-09-30 DIAGNOSIS — K219 Gastro-esophageal reflux disease without esophagitis: Secondary | ICD-10-CM | POA: Insufficient documentation

## 2015-09-30 DIAGNOSIS — Z79899 Other long term (current) drug therapy: Secondary | ICD-10-CM | POA: Insufficient documentation

## 2015-09-30 DIAGNOSIS — E7800 Pure hypercholesterolemia, unspecified: Secondary | ICD-10-CM | POA: Insufficient documentation

## 2015-09-30 DIAGNOSIS — F319 Bipolar disorder, unspecified: Secondary | ICD-10-CM | POA: Insufficient documentation

## 2015-09-30 DIAGNOSIS — M170 Bilateral primary osteoarthritis of knee: Secondary | ICD-10-CM | POA: Insufficient documentation

## 2015-09-30 DIAGNOSIS — J40 Bronchitis, not specified as acute or chronic: Secondary | ICD-10-CM

## 2015-09-30 DIAGNOSIS — Z7984 Long term (current) use of oral hypoglycemic drugs: Secondary | ICD-10-CM | POA: Insufficient documentation

## 2015-09-30 DIAGNOSIS — E119 Type 2 diabetes mellitus without complications: Secondary | ICD-10-CM | POA: Insufficient documentation

## 2015-09-30 DIAGNOSIS — I2510 Atherosclerotic heart disease of native coronary artery without angina pectoris: Secondary | ICD-10-CM | POA: Insufficient documentation

## 2015-09-30 DIAGNOSIS — M109 Gout, unspecified: Secondary | ICD-10-CM | POA: Insufficient documentation

## 2015-09-30 DIAGNOSIS — Z880 Allergy status to penicillin: Secondary | ICD-10-CM | POA: Insufficient documentation

## 2015-09-30 DIAGNOSIS — F419 Anxiety disorder, unspecified: Secondary | ICD-10-CM | POA: Insufficient documentation

## 2015-09-30 DIAGNOSIS — I10 Essential (primary) hypertension: Secondary | ICD-10-CM | POA: Insufficient documentation

## 2015-09-30 LAB — CBG MONITORING, ED
Glucose-Capillary: 61 mg/dL — ABNORMAL LOW (ref 65–99)
Glucose-Capillary: 112 mg/dL — ABNORMAL HIGH (ref 65–99)

## 2015-09-30 MED ORDER — AZITHROMYCIN 250 MG PO TABS: ORAL_TABLET | ORAL | Status: DC

## 2015-09-30 MED ORDER — ONDANSETRON HCL 4 MG PO TABS: 4.0000 mg | ORAL_TABLET | Freq: Four times a day (QID) | ORAL | Status: DC

## 2015-09-30 NOTE — ED Notes (Signed)
Pt reports cough x 2 weeks, bila rib and low back pain x 5 days.  Pt's CBG-en route was 67.  Pt reports she takes lantus at night.  Pt also reports nausea and has vomited x 2 days ago, on and off.  Pt reports she drinks 2 40oz beers daily and smokes crack-cocaine x 2 days ago.

## 2015-09-30 NOTE — ED Provider Notes (Signed)
CSN: [id number]     Arrival date & time 09/30/15  0749 History   First MD Initiated Contact with Patient 09/30/15 [phone]     Chief Complaint  Patient presents with  . Rib pain   . Cough     (Consider location/radiation/quality/duration/timing/severity/associated sxs/prior Treatment) Patient is a 50 y.o. female presenting with cough. The history is provided by the patient (Patient complains of cough yellow sputum production and some nausea).  Cough Cough characteristics:  Productive Severity:  Moderate Onset quality:  Sudden Timing:  Constant Progression:  Waxing and waning Chronicity:  New Smoker: no   Associated symptoms: no chest pain, no eye discharge, no headaches and no rash     Past Medical History  Diagnosis Date  . High cholesterol   . Depression   . Gout   . Anxiety   . Bipolar 1 disorder ([place])   . GERD (gastroesophageal reflux disease)   . Substance abuse     crack cocaine  . Headache(784.0)     migraines  . TMJ (temporomandibular joint disorder)   . Asthma     daily and prn inhalers  . Arthritis     back and knees  . Gastric ulcer   . 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 Hypertension     under control, has been on med. x 12 yrs.  . Diabetes mellitus     diet-controlled  . CHF (congestive heart failure) [facility])    Past Surgical History  Procedure Laterality Date  . Knee arthroscopy w/ partial medial meniscectomy  05/01/2010    right  . Breast lumpectomy with needle localization  04/19/2012    Procedure: BREAST LUMPECTOMY WITH NEEDLE LOCALIZATION;  Surgeon: [name], MD;  Location: [facility];  Service: General;  Laterality: Right;   Family History  Problem Relation Age of Onset  . Diabetes Mother   . Breast cancer Mother   . Cancer Father   . Bipolar disorder Maternal Aunt   . Schizophrenia Maternal Grandfather   . Alcoholism Maternal Uncle    Social History  Substance Use Topics  . Smoking status:  Current Every Day Smoker -- 0.50 packs/day for 15 years    Types: Cigarettes    Last Attempt to Quit: 08/02/2012  . Smokeless tobacco: Never Used  . Alcohol Use: 0.0 oz/week     Comment: reports she drinks 3 40 ounce beers daily   OB History    Gravida Para Term Preterm AB TAB SAB Ectopic Multiple Living   3 2 2  1  1   2      Review of Systems  Constitutional: Negative for appetite change and fatigue.  HENT: Negative for congestion, ear discharge and sinus pressure.   Eyes: Negative for discharge.  Respiratory: Positive for cough.   Cardiovascular: Negative for chest pain.  Gastrointestinal: Negative for abdominal pain and diarrhea.  Genitourinary: Negative for frequency and hematuria.  Musculoskeletal: Negative for back pain.  Skin: Negative for rash.  Neurological: Negative for seizures and headaches.  Psychiatric/Behavioral: Negative for hallucinations.      Allergies  Chocolate; Orange; Penicillins; Other; and Tomato  Home Medications   Prior to Admission medications   Medication Sig Start Date End Date Taking? Authorizing Provider  albuterol (PROVENTIL HFA;VENTOLIN HFA) 108 (90 Base) MCG/ACT inhaler Inhale 2 puffs into the lungs every 6 (six) hours as needed for wheezing or shortness of breath. 08/14/15  Yes Clovis Fredrickson, MD  amLODipine (  NORVASC) 5 MG tablet Take 1 tablet (5 mg total) by mouth daily. 08/14/15  Yes Jolanta B Pucilowska, MD  ARIPiprazole 400 MG SUSR Inject 400 mg into the muscle every 28 (twenty-eight) days. Last dose given 07/29/2014.  Next dose due 08/27/2015 08/14/15  Yes Jolanta B Pucilowska, MD  aspirin 81 MG tablet Take 1 tablet (81 mg total) by mouth daily. 08/14/15  Yes Jolanta B Pucilowska, MD  atenolol (TENORMIN) 100 MG tablet Take 100 mg by mouth daily.   Yes Historical Provider, MD  atorvastatin (LIPITOR) 40 MG tablet Take 1 tablet (40 mg total) by mouth every evening. 08/14/15  Yes Jolanta B Pucilowska, MD  baclofen (LIORESAL) 10 MG tablet  Take 10 mg by mouth 3 (three) times daily.   Yes Historical Provider, MD  calcium carbonate (OS-CAL - DOSED IN MG OF ELEMENTAL CALCIUM) 1250 (500 Ca) MG tablet Take 1 tablet (500 mg of elemental calcium total) by mouth daily with breakfast. 08/14/15  Yes Jolanta B Pucilowska, MD  celecoxib (CELEBREX) 100 MG capsule Take 100 mg by mouth 2 (two) times daily.   Yes Historical Provider, MD  cetirizine (ZYRTEC) 10 MG tablet Take 10 mg by mouth daily.   Yes Historical Provider, MD  Dexlansoprazole (DEXILANT) 30 MG capsule Take 30 mg by mouth daily.   Yes Historical Provider, MD  furosemide (LASIX) 40 MG tablet Take 1 tablet (40 mg total) by mouth daily. 08/14/15  Yes Jolanta B Pucilowska, MD  gabapentin (NEURONTIN) 400 MG capsule Take 1 capsule (400 mg total) by mouth 2 (two) times daily. 08/14/15  Yes Jolanta B Pucilowska, MD  glipiZIDE (GLUCOTROL) 10 MG tablet Take 10 mg by mouth 2 (two) times daily before a meal.   Yes Historical Provider, MD  hydrOXYzine (ATARAX/VISTARIL) 25 MG tablet Take 1 tablet (25 mg total) by mouth every 6 (six) hours as needed for anxiety. 08/14/15  Yes Jolanta B Pucilowska, MD  indomethacin (INDOCIN) 25 MG capsule Take 25 mg by mouth 3 (three) times daily as needed for mild pain.   Yes Historical Provider, MD  losartan (COZAAR) 25 MG tablet Take 25 mg by mouth 2 (two) times daily.   Yes Historical Provider, MD  metFORMIN (GLUCOPHAGE) 1000 MG tablet Take 1 tablet (1,000 mg total) by mouth 2 (two) times daily with a meal. 08/14/15  Yes Jolanta B Pucilowska, MD  metoCLOPramide (REGLAN) 10 MG tablet Take 10 mg by mouth 4 (four) times daily.   Yes Historical Provider, MD  Oxcarbazepine (TRILEPTAL) 300 MG tablet Take 1 tablet (300 mg total) by mouth 2 (two) times daily. 08/14/15  Yes Jolanta B Pucilowska, MD  sitaGLIPtin (JANUVIA) 100 MG tablet Take 100 mg by mouth daily.   Yes Historical Provider, MD  ARIPiprazole (ABILIFY) 10 MG tablet Take 1 tablet (10 mg total) by mouth daily. Patient  not taking: Reported on 09/30/2015 08/14/15   Clovis Fredrickson, MD  ARIPiprazole 400 MG SUSR Inject 400 mg into the muscle every 28 (twenty-eight) days. Patient not taking: Reported on 09/30/2015 08/27/15   Clovis Fredrickson, MD  azithromycin (ZITHROMAX Z-PAK) 250 MG tablet 2 po day one, then 1 daily x 4 days 09/30/15   Milton Ferguson, MD  linagliptin (TRADJENTA) 5 MG TABS tablet Take 1 tablet (5 mg total) by mouth daily. Patient not taking: Reported on 09/30/2015 08/14/15   Clovis Fredrickson, MD  ondansetron (ZOFRAN) 4 MG tablet Take 1 tablet (4 mg total) by mouth every 6 (six) hours. 09/30/15   Milton Ferguson,  MD  pantoprazole (PROTONIX) 40 MG tablet Take 1 tablet (40 mg total) by mouth 2 (two) times daily before a meal. Patient not taking: Reported on 09/30/2015 08/14/15   Clovis Fredrickson, MD  QUEtiapine (SEROQUEL XR) 400 MG 24 hr tablet Take 1 tablet (400 mg total) by mouth at bedtime. Patient not taking: Reported on 09/30/2015 08/14/15   Clovis Fredrickson, MD   BP 133/83 mmHg  Pulse 67  Temp(Src) 98.9 F (37.2 C) (Oral)  Resp 18  SpO2 94%  LMP  (Approximate) Physical Exam  Constitutional: She is oriented to person, place, and time. She appears well-developed.  HENT:  Head: Normocephalic.  Eyes: Conjunctivae and EOM are normal. No scleral icterus.  Neck: Neck supple. No thyromegaly present.  Cardiovascular: Normal rate and regular rhythm.  Exam reveals no gallop and no friction rub.   No murmur heard. Pulmonary/Chest: No stridor. She has no wheezes. She has no rales. She exhibits no tenderness.  Abdominal: She exhibits no distension. There is no tenderness. There is no rebound.  Musculoskeletal: Normal range of motion. She exhibits no edema.  Lymphadenopathy:    She has no cervical adenopathy.  Neurological: She is oriented to person, place, and time. She exhibits normal muscle tone. Coordination normal.  Skin: No rash noted. No erythema.  Psychiatric: She has a normal mood  and affect. Her behavior is normal.    ED Course  Procedures (including critical care time) Labs Review Labs Reviewed  CBG MONITORING, ED - Abnormal; Notable for the following:    Glucose-Capillary 61 (*)    All other components within normal limits  CBG MONITORING, ED - Abnormal; Notable for the following:    Glucose-Capillary 112 (*)    All other components within normal limits    Imaging Review Dg Chest 2 View  09/30/2015  CLINICAL DATA:  50 year old female with history of cough for the past 2 weeks. Bilateral rib and lower back pain for the past 5 days. EXAM: CHEST  2 VIEW COMPARISON:  Chest x-ray 11/23/2014. FINDINGS: Lung volumes are normal. No consolidative airspace disease. No pleural effusions. No pneumothorax. No pulmonary nodule or mass noted. Pulmonary vasculature and the cardiomediastinal silhouette are within normal limits. IMPRESSION: No radiographic evidence of acute cardiopulmonary disease. Electronically Signed   By: Vinnie Langton M.D.   On: 09/30/2015 09:14   I have personally reviewed and evaluated these images and lab results as part of my medical decision-making.   EKG Interpretation None      MDM   Final diagnoses:  Bronchitis    Patient with persistent bronchitis she will be put on Zithromax and Zofran and follow-up with her PCP as needed    Milton Ferguson, MD 09/30/15 1108

## 2015-09-30 NOTE — Discharge Instructions (Signed)
Drink plenty of fluids follow-up with her family doctor next week if not improving

## 2015-10-18 ENCOUNTER — Encounter (HOSPITAL_COMMUNITY): Payer: Self-pay | Admitting: Emergency Medicine

## 2015-10-18 ENCOUNTER — Emergency Department (HOSPITAL_COMMUNITY)
Admission: EM | Admit: 2015-10-18 | Discharge: 2015-10-19 | Disposition: A | Discharge status: Other Acute Inpatient Hospital | Payer: Self-pay | Attending: Emergency Medicine | Admitting: Emergency Medicine

## 2015-10-18 ENCOUNTER — Emergency Department (HOSPITAL_COMMUNITY): Payer: Self-pay

## 2015-10-18 DIAGNOSIS — E119 Type 2 diabetes mellitus without complications: Secondary | ICD-10-CM | POA: Insufficient documentation

## 2015-10-18 DIAGNOSIS — I110 Hypertensive heart disease with heart failure: Secondary | ICD-10-CM | POA: Insufficient documentation

## 2015-10-18 DIAGNOSIS — Z79899 Other long term (current) drug therapy: Secondary | ICD-10-CM | POA: Insufficient documentation

## 2015-10-18 DIAGNOSIS — K219 Gastro-esophageal reflux disease without esophagitis: Secondary | ICD-10-CM | POA: Insufficient documentation

## 2015-10-18 DIAGNOSIS — R45850 Homicidal ideations: Secondary | ICD-10-CM | POA: Insufficient documentation

## 2015-10-18 DIAGNOSIS — F251 Schizoaffective disorder, depressive type: Secondary | ICD-10-CM | POA: Diagnosis present

## 2015-10-18 DIAGNOSIS — J189 Pneumonia, unspecified organism: Secondary | ICD-10-CM | POA: Insufficient documentation

## 2015-10-18 DIAGNOSIS — Z791 Long term (current) use of non-steroidal anti-inflammatories (NSAID): Secondary | ICD-10-CM | POA: Insufficient documentation

## 2015-10-18 DIAGNOSIS — I509 Heart failure, unspecified: Secondary | ICD-10-CM | POA: Insufficient documentation

## 2015-10-18 DIAGNOSIS — F319 Bipolar disorder, unspecified: Secondary | ICD-10-CM | POA: Insufficient documentation

## 2015-10-18 DIAGNOSIS — Z7951 Long term (current) use of inhaled steroids: Secondary | ICD-10-CM | POA: Insufficient documentation

## 2015-10-18 DIAGNOSIS — M170 Bilateral primary osteoarthritis of knee: Secondary | ICD-10-CM | POA: Insufficient documentation

## 2015-10-18 DIAGNOSIS — Z792 Long term (current) use of antibiotics: Secondary | ICD-10-CM | POA: Insufficient documentation

## 2015-10-18 DIAGNOSIS — R45851 Suicidal ideations: Secondary | ICD-10-CM | POA: Insufficient documentation

## 2015-10-18 DIAGNOSIS — J45909 Unspecified asthma, uncomplicated: Secondary | ICD-10-CM | POA: Insufficient documentation

## 2015-10-18 DIAGNOSIS — F17210 Nicotine dependence, cigarettes, uncomplicated: Secondary | ICD-10-CM | POA: Insufficient documentation

## 2015-10-18 DIAGNOSIS — M4690 Unspecified inflammatory spondylopathy, site unspecified: Secondary | ICD-10-CM | POA: Insufficient documentation

## 2015-10-18 DIAGNOSIS — Z7984 Long term (current) use of oral hypoglycemic drugs: Secondary | ICD-10-CM | POA: Insufficient documentation

## 2015-10-18 DIAGNOSIS — E7800 Pure hypercholesterolemia, unspecified: Secondary | ICD-10-CM | POA: Insufficient documentation

## 2015-10-18 DIAGNOSIS — Z7982 Long term (current) use of aspirin: Secondary | ICD-10-CM | POA: Insufficient documentation

## 2015-10-18 DIAGNOSIS — R440 Auditory hallucinations: Secondary | ICD-10-CM | POA: Insufficient documentation

## 2015-10-18 LAB — COMPREHENSIVE METABOLIC PANEL
ALT: 19 U/L (ref 14–54)
ANION GAP: 11 (ref 5–15)
AST: 26 U/L (ref 15–41)
Albumin: 4.2 g/dL (ref 3.5–5.0)
Alkaline Phosphatase: 71 U/L (ref 38–126)
BILIRUBIN TOTAL: 0.6 mg/dL (ref 0.3–1.2)
BUN: 6 mg/dL (ref 6–20)
CO2: 24 mmol/L (ref 22–32)
Calcium: 9.5 mg/dL (ref 8.9–10.3)
Chloride: 106 mmol/L (ref 101–111)
Creatinine, Ser: 0.81 mg/dL (ref 0.44–1.00)
GFR calc Af Amer: >60 mL/min (ref >60)
Glucose, Bld: 106 mg/dL — ABNORMAL HIGH (ref 65–99)
POTASSIUM: 3.2 mmol/L — AB (ref 3.5–5.1)
Sodium: 141 mmol/L (ref 135–145)
TOTAL PROTEIN: 7.7 g/dL (ref 6.5–8.1)

## 2015-10-18 LAB — CBC
HCT: 38.6 % (ref 36.0–46.0)
Hemoglobin: 12.3 g/dL (ref 12.0–15.0)
MCH: 27.9 pg (ref 26.0–34.0)
MCHC: 31.9 g/dL (ref 30.0–36.0)
MCV: 87.5 fL (ref 78.0–100.0)
PLATELETS: 247 10*3/uL (ref 150–400)
RBC: 4.41 MIL/uL (ref 3.87–5.11)
RDW: 13.9 % (ref 11.5–15.5)
WBC: 6.4 10*3/uL (ref 4.0–10.5)

## 2015-10-18 LAB — ETHANOL

## 2015-10-18 LAB — SALICYLATE LEVEL: Salicylate Lvl: <4 mg/dL (ref 2.8–30.0)

## 2015-10-18 LAB — RAPID URINE DRUG SCREEN, HOSP PERFORMED
Amphetamines: NOT DETECTED
BENZODIAZEPINES: NOT DETECTED
Barbiturates: NOT DETECTED
COCAINE: POSITIVE — AB
Opiates: NOT DETECTED
Tetrahydrocannabinol: POSITIVE — AB

## 2015-10-18 LAB — ACETAMINOPHEN LEVEL

## 2015-10-18 MED ORDER — HYDROXYZINE HCL 25 MG PO TABS
25.0000 mg | ORAL_TABLET | Freq: Four times a day (QID) | ORAL | Status: DC | PRN
  Administered 2015-10-18: 25 mg via ORAL
  Filled 2015-10-18: qty 1

## 2015-10-18 MED ORDER — LOSARTAN POTASSIUM 25 MG PO TABS
25.0000 mg | ORAL_TABLET | Freq: Two times a day (BID) | ORAL | Status: DC
  Administered 2015-10-18 – 2015-10-19 (×2): 25 mg via ORAL
  Filled 2015-10-18 (×3): qty 1

## 2015-10-18 MED ORDER — ASPIRIN 81 MG PO CHEW
81.0000 mg | CHEWABLE_TABLET | Freq: Every day | ORAL | Status: DC
  Administered 2015-10-18 – 2015-10-19 (×2): 81 mg via ORAL
  Filled 2015-10-18 (×2): qty 1

## 2015-10-18 MED ORDER — LORATADINE 10 MG PO TABS
10.0000 mg | ORAL_TABLET | Freq: Every day | ORAL | Status: DC
  Administered 2015-10-18 – 2015-10-19 (×2): 10 mg via ORAL
  Filled 2015-10-18 (×2): qty 1

## 2015-10-18 MED ORDER — GLIPIZIDE 10 MG PO TABS: 10.0000 mg | ORAL_TABLET | Freq: Two times a day (BID) | ORAL | Status: DC

## 2015-10-18 MED ORDER — FUROSEMIDE 40 MG PO TABS
40.0000 mg | ORAL_TABLET | Freq: Every day | ORAL | Status: DC
  Administered 2015-10-19: 40 mg via ORAL
  Filled 2015-10-18 (×2): qty 1

## 2015-10-18 MED ORDER — ATORVASTATIN CALCIUM 40 MG PO TABS
40.0000 mg | ORAL_TABLET | Freq: Every evening | ORAL | Status: DC
Start: 2015-10-18 — End: 2015-10-19
  Administered 2015-10-18: 40 mg via ORAL
  Filled 2015-10-18 (×2): qty 1

## 2015-10-18 MED ORDER — METFORMIN HCL 500 MG PO TABS
1000.0000 mg | ORAL_TABLET | Freq: Two times a day (BID) | ORAL | Status: DC
  Administered 2015-10-19: 1000 mg via ORAL
  Filled 2015-10-18 (×3): qty 2

## 2015-10-18 MED ORDER — ALBUTEROL SULFATE HFA 108 (90 BASE) MCG/ACT IN AERS: 2.0000 | INHALATION_SPRAY | Freq: Four times a day (QID) | RESPIRATORY_TRACT | Status: DC | PRN

## 2015-10-18 MED ORDER — OXCARBAZEPINE 300 MG PO TABS
300.0000 mg | ORAL_TABLET | Freq: Two times a day (BID) | ORAL | Status: DC
  Administered 2015-10-18 – 2015-10-19 (×2): 300 mg via ORAL
  Filled 2015-10-18 (×2): qty 1

## 2015-10-18 MED ORDER — INDOMETHACIN 25 MG PO CAPS
25.0000 mg | ORAL_CAPSULE | Freq: Three times a day (TID) | ORAL | Status: DC | PRN
  Filled 2015-10-18: qty 1

## 2015-10-18 MED ORDER — GABAPENTIN 400 MG PO CAPS
400.0000 mg | ORAL_CAPSULE | Freq: Two times a day (BID) | ORAL | Status: DC
  Administered 2015-10-18 – 2015-10-19 (×2): 400 mg via ORAL
  Filled 2015-10-18 (×2): qty 1

## 2015-10-18 MED ORDER — CALCIUM CARBONATE 1250 (500 CA) MG PO TABS
1.0000 | ORAL_TABLET | Freq: Every day | ORAL | Status: DC
  Administered 2015-10-19: 500 mg via ORAL
  Filled 2015-10-18 (×2): qty 1

## 2015-10-18 MED ORDER — ATENOLOL 100 MG PO TABS
100.0000 mg | ORAL_TABLET | Freq: Every day | ORAL | Status: DC
  Administered 2015-10-18 – 2015-10-19 (×2): 100 mg via ORAL
  Filled 2015-10-18 (×2): qty 1

## 2015-10-18 MED ORDER — CELECOXIB 100 MG PO CAPS
100.0000 mg | ORAL_CAPSULE | Freq: Two times a day (BID) | ORAL | Status: DC
  Administered 2015-10-18 – 2015-10-19 (×2): 100 mg via ORAL
  Filled 2015-10-18 (×3): qty 1

## 2015-10-18 MED ORDER — AMLODIPINE BESYLATE 5 MG PO TABS
5.0000 mg | ORAL_TABLET | Freq: Every day | ORAL | Status: DC
  Administered 2015-10-18 – 2015-10-19 (×2): 5 mg via ORAL
  Filled 2015-10-18 (×2): qty 1

## 2015-10-18 MED ORDER — LINAGLIPTIN 5 MG PO TABS
5.0000 mg | ORAL_TABLET | Freq: Every day | ORAL | Status: DC
  Administered 2015-10-19: 5 mg via ORAL
  Filled 2015-10-18: qty 1

## 2015-10-18 MED ORDER — POTASSIUM CHLORIDE CRYS ER 20 MEQ PO TBCR
40.0000 meq | EXTENDED_RELEASE_TABLET | Freq: Once | ORAL | Status: AC
  Administered 2015-10-18: 40 meq via ORAL
  Filled 2015-10-18: qty 2

## 2015-10-18 MED ORDER — LEVOFLOXACIN 750 MG PO TABS
750.0000 mg | ORAL_TABLET | Freq: Every day | ORAL | Status: DC
  Administered 2015-10-18 – 2015-10-19 (×2): 750 mg via ORAL
  Filled 2015-10-18 (×2): qty 1

## 2015-10-18 MED ORDER — BACLOFEN 10 MG PO TABS
10.0000 mg | ORAL_TABLET | Freq: Three times a day (TID) | ORAL | Status: DC
  Administered 2015-10-18 – 2015-10-19 (×3): 10 mg via ORAL
  Filled 2015-10-18 (×5): qty 1

## 2015-10-18 NOTE — ED Notes (Signed)
Per [employer], pt reported SI, HI, auditory hallucinations, with plan to cut her wrist, pt did not have access to knife. Pt has hx of similar attempts. Calm, cooperative. Pt drinks about 6 tall beers per day, last drink yesterday. Reports using cocaine. Voices tell her to hurt other people and to jump in front of moving vehicles.

## 2015-10-18 NOTE — ED Notes (Signed)
Pt returned from xray with sitter

## 2015-10-18 NOTE — ED Notes (Signed)
pa at bedside. 

## 2015-10-18 NOTE — ED Notes (Signed)
Psych assessment team at bedside

## 2015-10-18 NOTE — ED Notes (Signed)
Patient transported to X-ray with sitter
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
## 2015-10-18 NOTE — BH Assessment (Signed)
[place] Assessment Progress Note  Per Charmaine Downs, NP, this pt continues to require psychiatric hospitalization.  The following facilities have been contacted to seek placement for this pt, with results as noted:  Beds available, information sent, decision pending:  Insurance risk surveyor   At capacity:  [facility]   [place], [place] Triage Specialist [phone]

## 2015-10-18 NOTE — ED Notes (Signed)
Report given to SAPU  

## 2015-10-18 NOTE — ED Provider Notes (Signed)
CSN: [id number]     Arrival date & time 10/18/15  1213 History   First MD Initiated Contact with Patient 10/18/15 1322     Chief Complaint  Patient presents with  . Suicidal     (Consider location/radiation/quality/duration/timing/severity/associated sxs/prior Treatment) HPI Maureen Swanson is a 50 y.o. female with PMH significant for Depression, anxiety, bipolar disorder, substance abuse (crack cocaine), asthma, DM, and CHF who presents with suicidal and homicidal ideations as well as auditory hallucinations. Patient reports over the past 2 months she has had constant, progressively worsening thoughts of cutting herself.  She also states she has auditory hallucinations instructing her to hurt other people and jump in front of vehicles. He reports he drinks about 6 40 ounces a day with the last drink being yesterday. She also reports she uses crack cocaine with last use 2 days ago. She reports she was seen a couple of weeks ago for a productive cough and told she had bronchitis. She finished her prescribed antibiotics, but states she still has an intermittent cough. She smokes about 0.5 ppd. Denies chest pain, shortness of breath, unilateral leg swelling, wheezing, fever, chills, nausea, vomiting, diarrhea, abdominal pain, or urinary symptoms.  No hx of DVT/PE.   Past Medical History  Diagnosis Date  . High cholesterol   . Depression   . Gout   . Anxiety   . Bipolar 1 disorder ([place])   . GERD (gastroesophageal reflux disease)   . Substance abuse     crack cocaine  . Headache(784.0)     migraines  . TMJ (temporomandibular joint disorder)   . Asthma     daily and prn inhalers  . Arthritis     back and knees  . Gastric ulcer   . 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 Hypertension     under control, has been on med. x 12 yrs.  . Diabetes mellitus     diet-controlled  . CHF (congestive heart failure) [facility])    Past Surgical History  Procedure Laterality Date  . Knee  arthroscopy w/ partial medial meniscectomy  05/01/2010    right  . Breast lumpectomy with needle localization  04/19/2012    Procedure: BREAST LUMPECTOMY WITH NEEDLE LOCALIZATION;  Surgeon: [name], MD;  Location: [facility];  Service: General;  Laterality: Right;   Family History  Problem Relation Age of Onset  . Diabetes Mother   . Breast cancer Mother   . Cancer Father   . Bipolar disorder Maternal Aunt   . Schizophrenia Maternal Grandfather   . Alcoholism Maternal Uncle    Social History  Substance Use Topics  . Smoking status: Current Every Day Smoker -- 0.50 packs/day for 15 years    Types: Cigarettes    Last Attempt to Quit: 08/02/2012  . Smokeless tobacco: Never Used  . Alcohol Use: 0.0 oz/week     Comment: reports she drinks 3 40 ounce beers daily   OB History    Gravida Para Term Preterm AB TAB SAB Ectopic Multiple Living   3 2 2  1  1   2      Review of Systems All other systems negative unless otherwise stated in HPI    Allergies  Chocolate; Orange; Penicillins; Other; and Tomato  Home Medications   Prior to Admission medications   Medication Sig Start Date End Date Taking? Authorizing Provider  albuterol (PROVENTIL HFA;VENTOLIN HFA) 108 (90 Base) MCG/ACT inhaler Inhale 2 puffs  into the lungs every 6 (six) hours as needed for wheezing or shortness of breath. 08/14/15  Yes Jolanta B Pucilowska, MD  amLODipine (NORVASC) 5 MG tablet Take 1 tablet (5 mg total) by mouth daily. 08/14/15  Yes Jolanta B Pucilowska, MD  ARIPiprazole 400 MG SUSR Inject 400 mg into the muscle every 28 (twenty-eight) days. Last dose given 07/29/2014.  Next dose due 08/27/2015 08/14/15  Yes Jolanta B Pucilowska, MD  aspirin 81 MG tablet Take 1 tablet (81 mg total) by mouth daily. 08/14/15  Yes Jolanta B Pucilowska, MD  atenolol (TENORMIN) 100 MG tablet Take 100 mg by mouth daily.   Yes Historical Provider, MD  atorvastatin (LIPITOR) 40 MG tablet Take 1 tablet (40 mg total)  by mouth every evening. 08/14/15  Yes Jolanta B Pucilowska, MD  baclofen (LIORESAL) 10 MG tablet Take 10 mg by mouth 3 (three) times daily.   Yes Historical Provider, MD  calcium carbonate (OS-CAL - DOSED IN MG OF ELEMENTAL CALCIUM) 1250 (500 Ca) MG tablet Take 1 tablet (500 mg of elemental calcium total) by mouth daily with breakfast. 08/14/15  Yes Jolanta B Pucilowska, MD  celecoxib (CELEBREX) 100 MG capsule Take 100 mg by mouth 2 (two) times daily.   Yes Historical Provider, MD  cetirizine (ZYRTEC) 10 MG tablet Take 10 mg by mouth daily.   Yes Historical Provider, MD  Dexlansoprazole (DEXILANT) 30 MG capsule Take 30 mg by mouth daily.   Yes Historical Provider, MD  furosemide (LASIX) 40 MG tablet Take 1 tablet (40 mg total) by mouth daily. 08/14/15  Yes Jolanta B Pucilowska, MD  gabapentin (NEURONTIN) 400 MG capsule Take 1 capsule (400 mg total) by mouth 2 (two) times daily. 08/14/15  Yes Jolanta B Pucilowska, MD  glipiZIDE (GLUCOTROL) 10 MG tablet Take 10 mg by mouth 2 (two) times daily before a meal.   Yes Historical Provider, MD  hydrOXYzine (ATARAX/VISTARIL) 25 MG tablet Take 1 tablet (25 mg total) by mouth every 6 (six) hours as needed for anxiety. 08/14/15  Yes Jolanta B Pucilowska, MD  indomethacin (INDOCIN) 25 MG capsule Take 25 mg by mouth 3 (three) times daily as needed for mild pain.   Yes Historical Provider, MD  losartan (COZAAR) 25 MG tablet Take 25 mg by mouth 2 (two) times daily.   Yes Historical Provider, MD  metFORMIN (GLUCOPHAGE) 1000 MG tablet Take 1 tablet (1,000 mg total) by mouth 2 (two) times daily with a meal. 08/14/15  Yes Jolanta B Pucilowska, MD  Oxcarbazepine (TRILEPTAL) 300 MG tablet Take 1 tablet (300 mg total) by mouth 2 (two) times daily. 08/14/15  Yes Jolanta B Pucilowska, MD  sitaGLIPtin (JANUVIA) 100 MG tablet Take 100 mg by mouth daily.   Yes Historical Provider, MD  ARIPiprazole (ABILIFY) 10 MG tablet Take 1 tablet (10 mg total) by mouth daily. Patient not taking:  Reported on 09/30/2015 08/14/15   Clovis Fredrickson, MD  ARIPiprazole 400 MG SUSR Inject 400 mg into the muscle every 28 (twenty-eight) days. Patient not taking: Reported on 09/30/2015 08/27/15   Clovis Fredrickson, MD  azithromycin (ZITHROMAX Z-PAK) 250 MG tablet 2 po day one, then 1 daily x 4 days Patient not taking: Reported on 10/18/2015 09/30/15   Milton Ferguson, MD  linagliptin (TRADJENTA) 5 MG TABS tablet Take 1 tablet (5 mg total) by mouth daily. Patient not taking: Reported on 09/30/2015 08/14/15   Clovis Fredrickson, MD  ondansetron (ZOFRAN) 4 MG tablet Take 1 tablet (4 mg total) by mouth  every 6 (six) hours. Patient not taking: Reported on 10/18/2015 09/30/15   Milton Ferguson, MD  pantoprazole (PROTONIX) 40 MG tablet Take 1 tablet (40 mg total) by mouth 2 (two) times daily before a meal. Patient not taking: Reported on 09/30/2015 08/14/15   Clovis Fredrickson, MD  QUEtiapine (SEROQUEL XR) 400 MG 24 hr tablet Take 1 tablet (400 mg total) by mouth at bedtime. Patient not taking: Reported on 09/30/2015 08/14/15   Clovis Fredrickson, MD   BP 115/101 mmHg  Pulse 80  Temp(Src) 98.4 F (36.9 C) (Oral)  Resp 16  SpO2 95%  LMP  (Approximate) Physical Exam  Constitutional: She is oriented to person, place, and time. She appears well-developed and well-nourished.  Non-toxic appearance. She does not have a sickly appearance. She does not appear ill.  HENT:  Head: Normocephalic and atraumatic.  Eyes: Conjunctivae are normal.  Neck: Normal range of motion. Neck supple.  Cardiovascular: Normal rate, regular rhythm and normal heart sounds.   No murmur heard. No lower extremity edema.  Pulmonary/Chest: Effort normal and breath sounds normal. No accessory muscle usage or stridor. No respiratory distress. She has no wheezes. She has no rhonchi. She has no rales.  Abdominal: Soft. Bowel sounds are normal. She exhibits no distension. There is no tenderness. There is no rebound and no guarding.   Musculoskeletal: Normal range of motion.  Lymphadenopathy:    She has no cervical adenopathy.  Neurological: She is alert and oriented to person, place, and time.  Speech clear without dysarthria.  Skin: Skin is warm and dry.  Psychiatric: Her speech is normal and behavior is normal. She exhibits a depressed mood. She expresses homicidal and suicidal ideation. She expresses suicidal plans. She expresses no homicidal plans.  Calm and cooperative.     ED Course  Procedures (including critical care time) Labs Review Labs Reviewed  COMPREHENSIVE METABOLIC PANEL - Abnormal; Notable for the following:    Potassium 3.2 (*)    Glucose, Bld 106 (*)    All other components within normal limits  ACETAMINOPHEN LEVEL - Abnormal; Notable for the following:    Acetaminophen (Tylenol), Serum <10 (*)    All other components within normal limits  URINE RAPID DRUG SCREEN, HOSP PERFORMED - Abnormal; Notable for the following:    Cocaine POSITIVE (*)    Tetrahydrocannabinol POSITIVE (*)    All other components within normal limits  ETHANOL  SALICYLATE LEVEL  CBC    Imaging Review Dg Chest 2 View  10/18/2015  CLINICAL DATA:  Cough EXAM: CHEST  2 VIEW COMPARISON:  09/30/2015 chest radiograph. FINDINGS: Stable cardiomediastinal silhouette with normal heart size. No pneumothorax. No pleural effusion. Mild patchy opacity in the lingula. No pulmonary edema. IMPRESSION: Mild patchy opacity in the lingula, which could represent a lingular pneumonia. Recommend follow-up PA and lateral post treatment chest radiographs in 4-6 weeks. Electronically Signed   By: Ilona Sorrel M.D.   On: 10/18/2015 14:20   I have personally reviewed and evaluated these images and lab results as part of my medical decision-making.   EKG Interpretation None      MDM   Final diagnoses:  Suicidal ideation  Homicidal ideation  Auditory hallucination  CAP (community acquired pneumonia)   Patient presents with SI, HI, and  auditory hallucinations.  VSS, NAD.  She does complain of ongoing cough.  Recently seen, 09/30/15, and prescribed Zithromax.  Will obtain CXR to rule out PNA.  No CP, SOB, or wheezing.  No indication for  breathing treatment at this time, lungs CTAB, heart RRR.  No lower extremity edema. Will obtain labs and consult TTS for appropriate disposition.  Labs remarkable for UDS positive for cocaine and THC.  Potassium 3.2, repleted in ED.  Otherwise, labs without acute abnormalities.  CXR shows mild patchy opacity in lingula, which could represent a lingular PNA.  CURB-65 score 0, patient stable for outpatient treatment. VSS, patient appears non-toxic or septic appearing.  WBC 6.4.  She does not meet SIRS criteria, low suspicion for sepsis. Will start Levaquin 750 mg QD x 5 days. Recommend repeat CXR in 4-6 weeks to evaluate for resolution.  Patient has been medically cleared, and stable for further evaluation by TTS.    Gloriann Loan, PA-C 10/18/15 [place], PA-C 10/18/15 1437  Milton Ferguson, MD 10/18/15 2209

## 2015-10-18 NOTE — ED Notes (Signed)
Patient appears flat. Cooperative. Reports SI. Denies HI, AVH. Reports mild anxiety and on going depression. Appears to sleep more than normal.

## 2015-10-18 NOTE — ED Notes (Signed)
Snack given.

## 2015-10-18 NOTE — ED Notes (Signed)
Pt reports she has not taken any of her BP medications today

## 2015-10-19 ENCOUNTER — Inpatient Hospital Stay (HOSPITAL_COMMUNITY)
Admission: AD | Admit: 2015-10-19 | Discharge: 2015-10-29 | DRG: 885 | Disposition: A | Discharge status: Home / Self Care | Payer: Medicaid Other | Source: Intra-hospital | Attending: Psychiatry | Admitting: Psychiatry

## 2015-10-19 ENCOUNTER — Encounter (HOSPITAL_COMMUNITY): Payer: Self-pay | Admitting: *Deleted

## 2015-10-19 DIAGNOSIS — F1020 Alcohol dependence, uncomplicated: Secondary | ICD-10-CM | POA: Diagnosis present

## 2015-10-19 DIAGNOSIS — F251 Schizoaffective disorder, depressive type: Principal | ICD-10-CM | POA: Diagnosis present

## 2015-10-19 DIAGNOSIS — F1420 Cocaine dependence, uncomplicated: Secondary | ICD-10-CM | POA: Diagnosis present

## 2015-10-19 DIAGNOSIS — E119 Type 2 diabetes mellitus without complications: Secondary | ICD-10-CM | POA: Diagnosis present

## 2015-10-19 DIAGNOSIS — F17210 Nicotine dependence, cigarettes, uncomplicated: Secondary | ICD-10-CM | POA: Diagnosis present

## 2015-10-19 DIAGNOSIS — Z9114 Patient's other noncompliance with medication regimen: Secondary | ICD-10-CM | POA: Diagnosis not present

## 2015-10-19 DIAGNOSIS — R45851 Suicidal ideations: Secondary | ICD-10-CM | POA: Diagnosis present

## 2015-10-19 DIAGNOSIS — Z8673 Personal history of transient ischemic attack (TIA), and cerebral infarction without residual deficits: Secondary | ICD-10-CM

## 2015-10-19 DIAGNOSIS — R45850 Homicidal ideations: Secondary | ICD-10-CM | POA: Diagnosis not present

## 2015-10-19 LAB — GLUCOSE, CAPILLARY
GLUCOSE-CAPILLARY: 116 mg/dL — AB (ref 65–99)
Glucose-Capillary: 118 mg/dL — ABNORMAL HIGH (ref 65–99)

## 2015-10-19 LAB — CBG MONITORING, ED: GLUCOSE-CAPILLARY: 73 mg/dL (ref 65–99)

## 2015-10-19 MED ORDER — LOSARTAN POTASSIUM 25 MG PO TABS
25.0000 mg | ORAL_TABLET | Freq: Two times a day (BID) | ORAL | Status: DC
  Administered 2015-10-19 – 2015-10-29 (×20): 25 mg via ORAL
  Filled 2015-10-19 (×25): qty 1

## 2015-10-19 MED ORDER — METFORMIN HCL 500 MG PO TABS
1000.0000 mg | ORAL_TABLET | Freq: Two times a day (BID) | ORAL | Status: DC
  Administered 2015-10-19 – 2015-10-29 (×20): 1000 mg via ORAL
  Filled 2015-10-19 (×25): qty 2

## 2015-10-19 MED ORDER — AMLODIPINE BESYLATE 5 MG PO TABS
5.0000 mg | ORAL_TABLET | Freq: Every day | ORAL | Status: DC
  Administered 2015-10-20 – 2015-10-29 (×10): 5 mg via ORAL
  Filled 2015-10-19 (×12): qty 1

## 2015-10-19 MED ORDER — TRAZODONE HCL 100 MG PO TABS: 100.0000 mg | ORAL_TABLET | Freq: Every day | ORAL | Status: DC

## 2015-10-19 MED ORDER — ATORVASTATIN CALCIUM 40 MG PO TABS
40.0000 mg | ORAL_TABLET | Freq: Every evening | ORAL | Status: DC
  Administered 2015-10-19 – 2015-10-28 (×10): 40 mg via ORAL
  Filled 2015-10-19 (×13): qty 1

## 2015-10-19 MED ORDER — CALCIUM CARBONATE 1250 (500 CA) MG PO TABS
1.0000 | ORAL_TABLET | Freq: Every day | ORAL | Status: DC
  Administered 2015-10-20 – 2015-10-29 (×10): 500 mg via ORAL
  Filled 2015-10-19 (×12): qty 1

## 2015-10-19 MED ORDER — PNEUMOCOCCAL VAC POLYVALENT 25 MCG/0.5ML IJ INJ: 0.5000 mL | INJECTION | INTRAMUSCULAR | Status: DC

## 2015-10-19 MED ORDER — FUROSEMIDE 40 MG PO TABS
40.0000 mg | ORAL_TABLET | Freq: Every day | ORAL | Status: DC
  Administered 2015-10-20 – 2015-10-29 (×10): 40 mg via ORAL
  Filled 2015-10-19 (×3): qty 1
  Filled 2015-10-19: qty 2
  Filled 2015-10-19 (×9): qty 1

## 2015-10-19 MED ORDER — LEVOFLOXACIN 750 MG PO TABS
750.0000 mg | ORAL_TABLET | Freq: Every day | ORAL | Status: AC
  Administered 2015-10-20 – 2015-10-22 (×3): 750 mg via ORAL
  Filled 2015-10-19 (×2): qty 1
  Filled 2015-10-19: qty 3
  Filled 2015-10-19: qty 1

## 2015-10-19 MED ORDER — ASPIRIN 81 MG PO CHEW
81.0000 mg | CHEWABLE_TABLET | Freq: Every day | ORAL | Status: DC
  Administered 2015-10-20 – 2015-10-29 (×10): 81 mg via ORAL
  Filled 2015-10-19 (×12): qty 1

## 2015-10-19 MED ORDER — ARIPIPRAZOLE 5 MG PO TABS
5.0000 mg | ORAL_TABLET | Freq: Every day | ORAL | Status: DC
  Administered 2015-10-20 – 2015-10-22 (×3): 5 mg via ORAL
  Filled 2015-10-19 (×4): qty 1

## 2015-10-19 MED ORDER — INDOMETHACIN 25 MG PO CAPS
25.0000 mg | ORAL_CAPSULE | Freq: Three times a day (TID) | ORAL | Status: DC | PRN
  Administered 2015-10-22 – 2015-10-28 (×3): 25 mg via ORAL
  Filled 2015-10-19 (×3): qty 1

## 2015-10-19 MED ORDER — ATENOLOL 50 MG PO TABS
100.0000 mg | ORAL_TABLET | Freq: Every day | ORAL | Status: DC
  Administered 2015-10-20 – 2015-10-29 (×10): 100 mg via ORAL
  Filled 2015-10-19 (×4): qty 2
  Filled 2015-10-19: qty 1
  Filled 2015-10-19 (×7): qty 2
  Filled 2015-10-19: qty 1

## 2015-10-19 MED ORDER — HYDROXYZINE HCL 25 MG PO TABS
25.0000 mg | ORAL_TABLET | Freq: Four times a day (QID) | ORAL | Status: DC | PRN
  Administered 2015-10-19: 25 mg via ORAL
  Filled 2015-10-19: qty 1

## 2015-10-19 MED ORDER — ALBUTEROL SULFATE HFA 108 (90 BASE) MCG/ACT IN AERS: 2.0000 | INHALATION_SPRAY | Freq: Four times a day (QID) | RESPIRATORY_TRACT | Status: DC | PRN

## 2015-10-19 MED ORDER — OXCARBAZEPINE 300 MG PO TABS
300.0000 mg | ORAL_TABLET | Freq: Two times a day (BID) | ORAL | Status: DC
  Administered 2015-10-19 – 2015-10-24 (×10): 300 mg via ORAL
  Filled 2015-10-19 (×13): qty 1

## 2015-10-19 MED ORDER — LORATADINE 10 MG PO TABS
10.0000 mg | ORAL_TABLET | Freq: Every day | ORAL | Status: DC
  Administered 2015-10-20 – 2015-10-29 (×10): 10 mg via ORAL
  Filled 2015-10-19 (×12): qty 1

## 2015-10-19 MED ORDER — GABAPENTIN 400 MG PO CAPS
400.0000 mg | ORAL_CAPSULE | Freq: Two times a day (BID) | ORAL | Status: DC
  Administered 2015-10-19 – 2015-10-29 (×20): 400 mg via ORAL
  Filled 2015-10-19 (×4): qty 1
  Filled 2015-10-19: qty 14
  Filled 2015-10-19 (×4): qty 1
  Filled 2015-10-19: qty 14
  Filled 2015-10-19 (×15): qty 1

## 2015-10-19 MED ORDER — CELECOXIB 100 MG PO CAPS
100.0000 mg | ORAL_CAPSULE | Freq: Two times a day (BID) | ORAL | Status: DC
  Administered 2015-10-19 – 2015-10-29 (×20): 100 mg via ORAL
  Filled 2015-10-19 (×26): qty 1

## 2015-10-19 MED ORDER — ALUM & MAG HYDROXIDE-SIMETH 200-200-20 MG/5ML PO SUSP: 30.0000 mL | ORAL | Status: DC | PRN

## 2015-10-19 MED ORDER — ARIPIPRAZOLE 5 MG PO TABS
5.0000 mg | ORAL_TABLET | Freq: Every day | ORAL | Status: DC
  Administered 2015-10-19: 5 mg via ORAL
  Filled 2015-10-19: qty 1

## 2015-10-19 MED ORDER — BACLOFEN 10 MG PO TABS
10.0000 mg | ORAL_TABLET | Freq: Three times a day (TID) | ORAL | Status: DC
  Administered 2015-10-19 – 2015-10-29 (×30): 10 mg via ORAL
  Filled 2015-10-19 (×36): qty 1

## 2015-10-19 MED ORDER — TRAZODONE HCL 100 MG PO TABS
100.0000 mg | ORAL_TABLET | Freq: Every day | ORAL | Status: DC
  Administered 2015-10-19: 100 mg via ORAL
  Filled 2015-10-19 (×4): qty 1

## 2015-10-19 MED ORDER — ACETAMINOPHEN 325 MG PO TABS
650.0000 mg | ORAL_TABLET | Freq: Four times a day (QID) | ORAL | Status: DC | PRN
  Administered 2015-10-20 – 2015-10-29 (×6): 650 mg via ORAL
  Filled 2015-10-19 (×6): qty 2

## 2015-10-19 MED ORDER — LINAGLIPTIN 5 MG PO TABS
5.0000 mg | ORAL_TABLET | Freq: Every day | ORAL | Status: DC
  Administered 2015-10-20 – 2015-10-29 (×10): 5 mg via ORAL
  Filled 2015-10-19 (×12): qty 1

## 2015-10-19 MED ORDER — MAGNESIUM HYDROXIDE 400 MG/5ML PO SUSP: 30.0000 mL | Freq: Every day | ORAL | Status: DC | PRN

## 2015-10-19 NOTE — Progress Notes (Signed)
Adult Psychoeducational Group Note  Date:  10/19/2015 Time:  8:36 PM  Group Topic/Focus:  Wrap-Up Group:   The focus of this group is to help patients review their daily goal of treatment and discuss progress on daily workbooks.  Participation Level:  Active  Participation Quality:  Appropriate  Affect:  Appropriate  Cognitive:  Appropriate  Insight: Appropriate  Engagement in Group:  Engaged  Modes of Intervention:  Discussion  Additional Comments: The patient expressed that she attended groups.The patient also said that she rates today a 9.  Nash Shearer 10/19/2015, 8:36 PM

## 2015-10-19 NOTE — Progress Notes (Signed)
Admission Note:  D-50 year old female who presents voluntary in no acute distress for the treatment of SI and Depression. Patient appears sad and depressed. Patient was calm and cooperative with admission process.  Patient reports a recent argument with boyfriend which resulted in boyfriend putting patient out of the home and patient being homeless.  Patient reports that following the argument with boyfriend, she started using crack and alcohol for 1 1/2 to 2 weeks.  Patient reports drinking 7-8 drinks daily.  Patient reports hx depression and crying spells for the past 2 months.  Patient reports self harm thoughts with no active plan to kill or harm self.  Patient contracts for safety stating "i will let someone know if the thoughts get worst".  Per report, patient was HI towards boyfriend.  Patient currently denies HI.  Patient denies AH. Patient reports visual hallucinations and reports seeing "elephants and flowers floating in the air".  Patient has diabetes without complication. Her CBG was 73 prior to being transported to [facility]. A- Skin was assessed and found to be clear of any abnormal marks apart from scars on arms bilat. Patient searched and no contraband found, POC and unit policies explained and understanding verbalized. Consents obtained.  R- Patient had no additional questions or concerns.

## 2015-10-19 NOTE — ED Notes (Signed)
Pt is having suicidal thoughts and said that if she is discharged she would cut herself to bleed out. Her sister has throat cancer, and pt thinks that grief is the cause of her current depression.  She has not been taking her medications, and they do help her when she takes them, per pt.

## 2015-10-19 NOTE — ED Notes (Signed)
Pt discharged to [facility] and transported by [facility].

## 2015-10-19 NOTE — Progress Notes (Addendum)
Writer has observed patient up in the dayroom watching tv with little to no interaction with peers. She requested her evening medication and inquired about her lantus. Writer informed her that this medication was not ordered and was not listed in her home medications. Writer asked that she talk with her doctor concerning her lantus. Support given and safety maintained on unit with 15 min checks.

## 2015-10-19 NOTE — Tx Team (Signed)
Initial Interdisciplinary Treatment Plan   PATIENT STRESSORS: Financial difficulties Health problems Legal issue Marital or family conflict Substance abuse   PATIENT STRENGTHS: Curator fund of knowledge Motivation for treatment/growth   PROBLEM LIST: Problem List/Patient Goals Date to be addressed Date deferred Reason deferred Estimated date of resolution  At risk for suicide 10/19/2015  10/19/2015   D/C  Depression 10/19/2015  10/19/2015   D/C  Substance Abuse 10/19/2015  10/19/2015   D/C  "self-esteem" 10/19/2015  10/19/2015   D/C  "Attitude" 10/19/2015  10/19/2015   D/C                           DISCHARGE CRITERIA:  Adequate post-discharge living arrangements Improved stabilization in mood, thinking, and/or behavior Motivation to continue treatment in a less acute level of care Need for constant or close observation no longer present Reduction of life-threatening or endangering symptoms to within safe limits Withdrawal symptoms are absent or subacute and managed without 24-hour nursing intervention  PRELIMINARY DISCHARGE PLAN: Attend 12-step recovery group Outpatient therapy Placement in alternative living arrangements  PATIENT/FAMIILY INVOLVEMENT: This treatment plan has been presented to and reviewed with the patient, Maureen Swanson.  The patient and family have been given the opportunity to ask questions and make suggestions.  Donne Hazel P 10/19/2015, 5:02 PM

## 2015-10-19 NOTE — Consult Note (Signed)
[facility] Consult   Reason for Consult:  Suicidal ideations with a plan to cut herself Referring Physician:  EDP Patient Identification: Maureen Swanson MRN:  [id number] Principal Diagnosis: Schizoaffective disorder, depressive type [facility]) Diagnosis:   Patient Active Problem List   Diagnosis Date Noted  . Schizoaffective disorder, depressive type ([place]) [F25.1] 10/19/2015    Priority: High  . Knee pain [M25.569] 10/17/2010    Priority: Medium  . Tobacco use disorder [F17.200] 07/20/2015  . Diabetes mellitus ([place]) [E11.9] 07/20/2015  . Suicidal ideation [R45.851]   . Cocaine use disorder, moderate, dependence ([place]) [F14.20] 04/03/2015  . Schizoaffective disorder, bipolar type ([place]) [F25.0] 04/03/2015  . Alcohol use disorder, moderate, dependence ([place]) [F10.20] 04/03/2015  . Acute ischemic stroke ([place]) [I63.9] 12/08/2014  . Left-sided weakness [M62.89] 12/08/2014  . 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 MAMMOGRAM, ABNORMAL, RIGHT [R92.8] 08/05/2010  . Dyslipidemia [E78.5] 06/21/2010  . AMENORRHEA, SECONDARY [N91.2] 10/29/2009  . 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 GERD [K21.9] 10/11/2007  . Essential hypertension [I10] 02/26/2007    Total Time spent with patient: 45 minutes  Subjective:   Maureen Swanson is a 50 y.o. female patient admitted with suicidal ideations and a plan to cut herself.  HPI:  50 yo female who presented to the ED with suicidal ideations with a plan to cut herself, cut herself superficially in February and admitted.  She has several stressors:  Boyfriend break-up with being "thrown out", homeless, sister dying of cancer.  Maureen Swanson has been using alcohol, crack/cocaine at times.  Patient at [facility] and was on the injectable of Abilify but has not recently been taking her medication.  Homicidal ideations towards her ex-boyfriend to shoot him but does not have a gun.  No hallucinations at this time.  Past Psychiatric History:  schizoaffective disorder, cocaine abuse  Risk to Self: Suicidal Ideation: Yes-Currently Present Suicidal Intent: Yes-Currently Present Is patient at risk for suicide?: Yes Suicidal Plan?: Yes-Currently Present Specify Current Suicidal Plan: cut or shoot self Access to Means: No What has been your use of drugs/alcohol within the last 12 months?: see above How many times?: 3 Other Self Harm Risks: pinching self Triggers for Past Attempts: Unknown Intentional Self Injurious Behavior: Damaging Comment - Self Injurious Behavior: pinching self Risk to Others: Homicidal Ideation: No-Not Currently/Within Last 6 Months Thoughts of Harm to Others: No-Not Currently Present/Within Last 6 Months Current Homicidal Intent: No Current Homicidal Plan: No Access to Homicidal Means: No Identified Victim: none noted History of harm to others?: No Assessment of Violence: None Noted Violent Behavior Description: none noted Does patient have access to weapons?: No Criminal Charges Pending?: Yes Describe Pending Criminal Charges: trespassing Does patient have a court date: Yes Court Date:  (pt reports sometime next month) Prior Inpatient Therapy: Prior Inpatient Therapy: Yes Prior Therapy Dates: multiple from 2013-2017 Prior Therapy Facilty/Provider(s): [facility], [facility], other Reason for Treatment: SI, MDD, Alcohol use, Cocaine use, schizoaffective Prior Outpatient Therapy: Prior Outpatient Therapy: No Does patient have an ACCT team?: No Does patient have Intensive In-House Services?  : No Does patient have [facility] services? : No Does patient have P4CC services?: No  Past Medical History:  Past Medical History  Diagnosis Date  . High cholesterol   . Depression   . Gout   . Anxiety   . Bipolar 1 disorder ([place])   . GERD (gastroesophageal reflux disease)   . Substance abuse     crack cocaine  . Headache(784.0)  migraines  . TMJ (temporomandibular joint disorder)   . Asthma     daily and prn  inhalers  . Arthritis     back and knees  . Gastric ulcer   . 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 Hypertension     under control, has been on med. x 12 yrs.  . Diabetes mellitus     diet-controlled  . CHF (congestive heart failure) [facility])     Past Surgical History  Procedure Laterality Date  . Knee arthroscopy w/ partial medial meniscectomy  05/01/2010    right  . Breast lumpectomy with needle localization  04/19/2012    Procedure: BREAST LUMPECTOMY WITH NEEDLE LOCALIZATION;  Surgeon: [name], MD;  Location: [facility];  Service: General;  Laterality: Right;   Family History:  Family History  Problem Relation Age of Onset  . Diabetes Mother   . Breast cancer Mother   . Cancer Father   . Bipolar disorder Maternal Aunt   . Schizophrenia Maternal Grandfather   . Alcoholism Maternal Uncle    Family Psychiatric  History: None Social History:  History  Alcohol Use  . 0.0 oz/week    Comment: reports she drinks 3 40 ounce beers daily     History  Drug Use  . Yes  . Special: Cocaine    Comment: 09/28/2015    Social History   Social History  . Marital Status: Single    Spouse Name: N/A  . Number of Children: N/A  . Years of Education: N/A   Social History Main Topics  . Smoking status: Current Every Day Smoker -- 0.50 packs/day for 15 years    Types: Cigarettes    Last Attempt to Quit: 08/02/2012  . Smokeless tobacco: Never Used  . Alcohol Use: 0.0 oz/week     Comment: reports she drinks 3 40 ounce beers daily  . Drug Use: Yes    Special: Cocaine     Comment: 09/28/2015  . Sexual Activity: No   Other Topics Concern  . None   Social History Narrative   Additional Social History:    Allergies:   Allergies  Allergen Reactions  . Chocolate Hives  . Orange Hives    "Acid foods"  . Penicillins Hives    Has patient had a PCN reaction causing immediate rash, facial/tongue/throat swelling, SOB or lightheadedness with  hypotension: Yes Has patient had a PCN reaction causing severe rash involving mucus membranes or skin necrosis: Yes Has patient had a PCN reaction that required hospitalization No Has patient had a PCN reaction occurring within the last 10 years: No If all of the above answers are "NO", then may proceed with Cephalosporin use.   . Other Swelling    strawberries  . Tomato Hives    "acid foods"    Labs:  Results for orders placed or performed during the hospital encounter of 10/18/15 (from the past 48 hour(s))  Comprehensive metabolic panel     Status: Abnormal   Collection Time: 10/18/15 12:38 PM  Result Value Ref Range   Sodium 141 135 - 145 mmol/L   Potassium 3.2 (L) 3.5 - 5.1 mmol/L   Chloride 106 101 - 111 mmol/L   CO2 24 22 - 32 mmol/L   Glucose, Bld 106 (H) 65 - 99 mg/dL   BUN 6 6 - 20 mg/dL   Creatinine, Ser 0.81 0.44 - 1.00 mg/dL   Calcium 9.5 8.9 - 10.3 mg/dL  Total Protein 7.7 6.5 - 8.1 g/dL   Albumin 4.2 3.5 - 5.0 g/dL   AST 26 15 - 41 U/L   ALT 19 14 - 54 U/L   Alkaline Phosphatase 71 38 - 126 U/L   Total Bilirubin 0.6 0.3 - 1.2 mg/dL   GFR calc non Af Amer >60 >60 mL/min   GFR calc Af Amer >60 >60 mL/min    Comment: (NOTE) The eGFR has been calculated using the CKD EPI equation. This calculation has not been validated in all clinical situations. eGFR's persistently <60 mL/min signify possible Chronic Kidney Disease.    Anion gap 11 5 - 15  Ethanol     Status: None   Collection Time: 10/18/15 12:38 PM  Result Value Ref Range   Alcohol, Ethyl (B) <5 <5 mg/dL    Comment:        LOWEST DETECTABLE LIMIT FOR SERUM ALCOHOL IS 5 mg/dL FOR MEDICAL PURPOSES ONLY   Salicylate level     Status: None   Collection Time: 10/18/15 12:38 PM  Result Value Ref Range   Salicylate Lvl <5.4 2.8 - 30.0 mg/dL  Acetaminophen level     Status: Abnormal   Collection Time: 10/18/15 12:38 PM  Result Value Ref Range   Acetaminophen (Tylenol), Serum <10 (L) 10 - 30 ug/mL     Comment:        THERAPEUTIC CONCENTRATIONS VARY SIGNIFICANTLY. A RANGE OF 10-30 ug/mL MAY BE AN EFFECTIVE CONCENTRATION FOR MANY PATIENTS. HOWEVER, SOME ARE BEST TREATED AT CONCENTRATIONS OUTSIDE THIS RANGE. ACETAMINOPHEN CONCENTRATIONS >150 ug/mL AT 4 HOURS AFTER INGESTION AND >50 ug/mL AT 12 HOURS AFTER INGESTION ARE OFTEN ASSOCIATED WITH TOXIC REACTIONS.   cbc     Status: None   Collection Time: 10/18/15 12:38 PM  Result Value Ref Range   WBC 6.4 4.0 - 10.5 K/uL   RBC 4.41 3.87 - 5.11 MIL/uL   Hemoglobin 12.3 12.0 - 15.0 g/dL   HCT 38.6 36.0 - 46.0 %   MCV 87.5 78.0 - 100.0 fL   MCH 27.9 26.0 - 34.0 pg   MCHC 31.9 30.0 - 36.0 g/dL   RDW 13.9 11.5 - 15.5 %   Platelets 247 150 - 400 K/uL  Rapid urine drug screen (hospital performed)     Status: Abnormal   Collection Time: 10/18/15 12:57 PM  Result Value Ref Range   Opiates NONE DETECTED NONE DETECTED   Cocaine POSITIVE (A) NONE DETECTED   Benzodiazepines NONE DETECTED NONE DETECTED   Amphetamines NONE DETECTED NONE DETECTED   Tetrahydrocannabinol POSITIVE (A) NONE DETECTED   Barbiturates NONE DETECTED NONE DETECTED    Comment:        DRUG SCREEN FOR MEDICAL PURPOSES ONLY.  IF CONFIRMATION IS NEEDED FOR ANY PURPOSE, NOTIFY LAB WITHIN 5 DAYS.        LOWEST DETECTABLE LIMITS FOR URINE DRUG SCREEN Drug Class       Cutoff (ng/mL) Amphetamine      1000 Barbiturate      200 Benzodiazepine   656 Tricyclics       812 Opiates          300 Cocaine          300 THC              50     Current Facility-Administered Medications  Medication Dose Route Frequency Provider Last Rate Last Dose  . albuterol (PROVENTIL HFA;VENTOLIN HFA) 108 (90 Base) MCG/ACT inhaler 2 puff  2 puff Inhalation Q6H  PRN Gloriann Loan, PA-C      . amLODipine (NORVASC) tablet 5 mg  5 mg Oral Daily Gloriann Loan, PA-C   5 mg at 10/19/15 5009  . ARIPiprazole (ABILIFY) tablet 5 mg  5 mg Oral Daily Patrecia Pour, NP      . aspirin chewable tablet 81 mg  81  mg Oral Daily Kayla Rose, PA-C   81 mg at 10/19/15 3818  . atenolol (TENORMIN) tablet 100 mg  100 mg Oral Daily Gloriann Loan, PA-C   100 mg at 10/19/15 0955  . atorvastatin (LIPITOR) tablet 40 mg  40 mg Oral QPM Kayla Rose, PA-C   40 mg at 10/18/15 1831  . baclofen (LIORESAL) tablet 10 mg  10 mg Oral TID Gloriann Loan, PA-C   10 mg at 10/19/15 2993  . calcium carbonate (OS-CAL - dosed in mg of elemental calcium) tablet 500 mg of elemental calcium  1 tablet Oral Q breakfast Kayla Rose, PA-C   500 mg of elemental calcium at 10/19/15 0851  . celecoxib (CELEBREX) capsule 100 mg  100 mg Oral BID Gloriann Loan, PA-C   100 mg at 10/19/15 0955  . furosemide (LASIX) tablet 40 mg  40 mg Oral Daily Kayla Rose, PA-C   40 mg at 10/19/15 0951  . gabapentin (NEURONTIN) capsule 400 mg  400 mg Oral BID Gloriann Loan, PA-C   400 mg at 10/19/15 7169  . hydrOXYzine (ATARAX/VISTARIL) tablet 25 mg  25 mg Oral Q6H PRN Gloriann Loan, PA-C   25 mg at 10/18/15 2216  . indomethacin (INDOCIN) capsule 25 mg  25 mg Oral TID PRN Gloriann Loan, PA-C      . levofloxacin (LEVAQUIN) tablet 750 mg  750 mg Oral Daily Kayla Rose, PA-C   750 mg at 10/19/15 0951  . linagliptin (TRADJENTA) tablet 5 mg  5 mg Oral Daily Gloriann Loan, PA-C   5 mg at 10/19/15 6789  . loratadine (CLARITIN) tablet 10 mg  10 mg Oral Daily Gloriann Loan, PA-C   10 mg at 10/19/15 0951  . losartan (COZAAR) tablet 25 mg  25 mg Oral BID Gloriann Loan, PA-C   25 mg at 10/19/15 0955  . metFORMIN (GLUCOPHAGE) tablet 1,000 mg  1,000 mg Oral BID WC Gloriann Loan, PA-C   1,000 mg at 10/19/15 0851  . Oxcarbazepine (TRILEPTAL) tablet 300 mg  300 mg Oral BID Gloriann Loan, PA-C   300 mg at 10/19/15 3810   Current Outpatient Prescriptions  Medication Sig Dispense Refill  . albuterol (PROVENTIL HFA;VENTOLIN HFA) 108 (90 Base) MCG/ACT inhaler Inhale 2 puffs into the lungs every 6 (six) hours as needed for wheezing or shortness of breath. 1 Inhaler 0  . amLODipine (NORVASC) 5 MG tablet Take 1 tablet (5 mg  total) by mouth daily. 30 tablet 0  . ARIPiprazole 400 MG SUSR Inject 400 mg into the muscle every 28 (twenty-eight) days. Last dose given 07/29/2014.  Next dose due 08/27/2015 1 each 0  . aspirin 81 MG tablet Take 1 tablet (81 mg total) by mouth daily. 30 tablet 0  . atenolol (TENORMIN) 100 MG tablet Take 100 mg by mouth daily.    Marland Kitchen atorvastatin (LIPITOR) 40 MG tablet Take 1 tablet (40 mg total) by mouth every evening. 30 tablet 0  . baclofen (LIORESAL) 10 MG tablet Take 10 mg by mouth 3 (three) times daily.    . calcium carbonate (OS-CAL - DOSED IN MG OF ELEMENTAL CALCIUM) 1250 (500 Ca) MG tablet Take 1 tablet (500  mg of elemental calcium total) by mouth daily with breakfast. 30 tablet 0  . celecoxib (CELEBREX) 100 MG capsule Take 100 mg by mouth 2 (two) times daily.    . cetirizine (ZYRTEC) 10 MG tablet Take 10 mg by mouth daily.    Marland Kitchen Dexlansoprazole (DEXILANT) 30 MG capsule Take 30 mg by mouth daily.    . furosemide (LASIX) 40 MG tablet Take 1 tablet (40 mg total) by mouth daily. 30 tablet 0  . gabapentin (NEURONTIN) 400 MG capsule Take 1 capsule (400 mg total) by mouth 2 (two) times daily. 60 capsule 0  . glipiZIDE (GLUCOTROL) 10 MG tablet Take 10 mg by mouth 2 (two) times daily before a meal.    . hydrOXYzine (ATARAX/VISTARIL) 25 MG tablet Take 1 tablet (25 mg total) by mouth every 6 (six) hours as needed for anxiety. 90 tablet 0  . indomethacin (INDOCIN) 25 MG capsule Take 25 mg by mouth 3 (three) times daily as needed for mild pain.    Marland Kitchen losartan (COZAAR) 25 MG tablet Take 25 mg by mouth 2 (two) times daily.    . metFORMIN (GLUCOPHAGE) 1000 MG tablet Take 1 tablet (1,000 mg total) by mouth 2 (two) times daily with a meal. 60 tablet 0  . Oxcarbazepine (TRILEPTAL) 300 MG tablet Take 1 tablet (300 mg total) by mouth 2 (two) times daily. 60 tablet 0  . sitaGLIPtin (JANUVIA) 100 MG tablet Take 100 mg by mouth daily.    . ARIPiprazole (ABILIFY) 10 MG tablet Take 1 tablet (10 mg total) by mouth  daily. (Patient not taking: Reported on 09/30/2015) 60 tablet 0  . ARIPiprazole 400 MG SUSR Inject 400 mg into the muscle every 28 (twenty-eight) days. (Patient not taking: Reported on 09/30/2015) 1 each 0  . azithromycin (ZITHROMAX Z-PAK) 250 MG tablet 2 po day one, then 1 daily x 4 days (Patient not taking: Reported on 10/18/2015) 5 tablet 0  . linagliptin (TRADJENTA) 5 MG TABS tablet Take 1 tablet (5 mg total) by mouth daily. (Patient not taking: Reported on 09/30/2015) 30 tablet 0  . ondansetron (ZOFRAN) 4 MG tablet Take 1 tablet (4 mg total) by mouth every 6 (six) hours. (Patient not taking: Reported on 10/18/2015) 12 tablet 0  . pantoprazole (PROTONIX) 40 MG tablet Take 1 tablet (40 mg total) by mouth 2 (two) times daily before a meal. (Patient not taking: Reported on 09/30/2015) 60 tablet 0  . QUEtiapine (SEROQUEL XR) 400 MG 24 hr tablet Take 1 tablet (400 mg total) by mouth at bedtime. (Patient not taking: Reported on 09/30/2015) 30 tablet 0    Musculoskeletal: Strength & Muscle Tone: within normal limits Gait & Station: normal Patient leans: N/A  Psychiatric Specialty Exam: Review of Systems  Constitutional: Negative.   HENT: Negative.   Eyes: Negative.   Respiratory: Negative.   Cardiovascular: Negative.   Gastrointestinal: Negative.   Genitourinary: Negative.   Musculoskeletal: Negative.   Skin: Negative.   Neurological: Negative.   Endo/Heme/Allergies: Negative.   Psychiatric/Behavioral: Positive for depression, suicidal ideas and substance abuse.    Blood pressure 128/98, pulse 69, temperature 97.6 F (36.4 C), temperature source Oral, resp. rate 18, SpO2 98 %.There is no weight on file to calculate BMI.  General Appearance: Casual  Eye Contact::  Fair  Speech:  Normal Rate  Volume:  Normal  Mood:  Depressed and Irritable  Affect:  Congruent  Thought Process:  Coherent  Orientation:  Full (Time, Place, and Person)  Thought Content:  Rumination  Suicidal Thoughts:  Yes.   with intent/plan  Homicidal Thoughts:  Yes.  without intent/plan  Memory:  Immediate;   Fair Recent;   Fair Remote;   Fair  Judgement:  Fair  Insight:  Fair  Psychomotor Activity:  Decreased  Concentration:  Fair  Recall:  [employer] of Knowledge:Fair  Language: Fair  Akathisia:  No  Handed:  Right  AIMS (if indicated):     Assets:  Leisure Time Resilience  ADL's:  Intact  Cognition: WNL  Sleep:      Treatment Plan Summary: Daily contact with patient to assess and evaluate symptoms and progress in treatment, Medication management and Plan schizoaffective disorder, depressed type:  -Crisis stabilization -Medication management:  Continue medical medications and start Trileptal 300 mg BID for mood stabilization, Vistaril 25 mg every six hours PRN anxiety, Gabapentin 400 mg BID for anxiety/withdrawal, and Abilify 5 mg daily for depression. -Individual and substance abuse counseling  Disposition: Recommend psychiatric Inpatient admission when medically cleared.  Waylan Boga, NP 10/19/2015 10:24 AM Patient seen face-to-face for psychiatric evaluation, chart reviewed and case discussed with the physician extender and developed treatment plan. Reviewed the information documented and agree with the treatment plan. Corena Pilgrim, MD

## 2015-10-19 NOTE — BH Assessment (Signed)
[place] Assessment Progress Note  Per Corena Pilgrim, MD, this pt requires psychiatric hospitalization at this time.  Ria Comment, RN, [facility] has assigned pt to Rm [id number], requesting that pt be transferred at 13:00.  Pt has signed Voluntary Admission and Consent for Treatment, as well as Consent to Release Information to [place], her outpatient provider, and a notification call has been placed.  Signed forms have been faxed to [facility].  Pt's nurse, Diane, has been notified, and agrees to send original paperwork along with pt via [facility], and to call report to [phone].  Jalene Mullet, [place] Triage Specialist [phone]

## 2015-10-20 DIAGNOSIS — F1020 Alcohol dependence, uncomplicated: Secondary | ICD-10-CM

## 2015-10-20 DIAGNOSIS — R45850 Homicidal ideations: Secondary | ICD-10-CM

## 2015-10-20 DIAGNOSIS — F251 Schizoaffective disorder, depressive type: Principal | ICD-10-CM

## 2015-10-20 DIAGNOSIS — R45851 Suicidal ideations: Secondary | ICD-10-CM

## 2015-10-20 DIAGNOSIS — F1420 Cocaine dependence, uncomplicated: Secondary | ICD-10-CM

## 2015-10-20 LAB — GLUCOSE, CAPILLARY
Glucose-Capillary: 120 mg/dL — ABNORMAL HIGH (ref 65–99)
Glucose-Capillary: 89 mg/dL (ref 65–99)
Glucose-Capillary: 127 mg/dL — ABNORMAL HIGH (ref 65–99)
Glucose-Capillary: 120 mg/dL — ABNORMAL HIGH (ref 65–99)

## 2015-10-20 MED ORDER — ONDANSETRON 4 MG PO TBDP: 4.0000 mg | ORAL_TABLET | Freq: Four times a day (QID) | ORAL | Status: AC | PRN

## 2015-10-20 MED ORDER — LORAZEPAM 1 MG PO TABS: 1.0000 mg | ORAL_TABLET | Freq: Four times a day (QID) | ORAL | Status: AC | PRN

## 2015-10-20 MED ORDER — LORAZEPAM 1 MG PO TABS
1.0000 mg | ORAL_TABLET | Freq: Four times a day (QID) | ORAL | Status: AC
  Administered 2015-10-20 – 2015-10-21 (×4): 1 mg via ORAL
  Filled 2015-10-20 (×4): qty 1

## 2015-10-20 MED ORDER — LOPERAMIDE HCL 2 MG PO CAPS: 2.0000 mg | ORAL_CAPSULE | ORAL | Status: AC | PRN

## 2015-10-20 MED ORDER — LORAZEPAM 1 MG PO TABS
1.0000 mg | ORAL_TABLET | Freq: Three times a day (TID) | ORAL | Status: AC
  Administered 2015-10-21 – 2015-10-22 (×3): 1 mg via ORAL
  Filled 2015-10-20 (×3): qty 1

## 2015-10-20 MED ORDER — HYDROXYZINE HCL 25 MG PO TABS
25.0000 mg | ORAL_TABLET | Freq: Four times a day (QID) | ORAL | Status: AC | PRN
  Administered 2015-10-21 – 2015-10-22 (×4): 25 mg via ORAL
  Filled 2015-10-20 (×5): qty 1

## 2015-10-20 MED ORDER — THIAMINE HCL 100 MG/ML IJ SOLN
100.0000 mg | Freq: Once | INTRAMUSCULAR | Status: AC
  Administered 2015-10-20: 100 mg via INTRAMUSCULAR
  Filled 2015-10-20: qty 2

## 2015-10-20 MED ORDER — VITAMIN B-1 100 MG PO TABS
100.0000 mg | ORAL_TABLET | Freq: Every day | ORAL | Status: DC
  Administered 2015-10-21 – 2015-10-29 (×9): 100 mg via ORAL
  Filled 2015-10-20 (×11): qty 1

## 2015-10-20 MED ORDER — LORAZEPAM 1 MG PO TABS
1.0000 mg | ORAL_TABLET | Freq: Two times a day (BID) | ORAL | Status: AC
  Administered 2015-10-22 – 2015-10-23 (×2): 1 mg via ORAL
  Filled 2015-10-20 (×2): qty 1

## 2015-10-20 MED ORDER — INSULIN GLARGINE 100 UNIT/ML ~~LOC~~ SOLN
30.0000 [IU] | Freq: Every day | SUBCUTANEOUS | Status: DC
  Administered 2015-10-20 – 2015-10-28 (×8): 30 [IU] via SUBCUTANEOUS

## 2015-10-20 MED ORDER — ADULT MULTIVITAMIN W/MINERALS CH
1.0000 | ORAL_TABLET | Freq: Every day | ORAL | Status: DC
  Administered 2015-10-20 – 2015-10-29 (×10): 1 via ORAL
  Filled 2015-10-20 (×13): qty 1

## 2015-10-20 MED ORDER — TRAZODONE HCL 150 MG PO TABS
150.0000 mg | ORAL_TABLET | Freq: Every day | ORAL | Status: DC
  Administered 2015-10-20: 150 mg via ORAL
  Filled 2015-10-20 (×2): qty 1

## 2015-10-20 MED ORDER — LORAZEPAM 1 MG PO TABS
1.0000 mg | ORAL_TABLET | Freq: Every day | ORAL | Status: AC
  Administered 2015-10-24: 1 mg via ORAL
  Filled 2015-10-20: qty 1

## 2015-10-20 NOTE — H&P (Signed)
Psychiatric Admission Assessment Adult  Patient Identification: Maureen Swanson MRN:  [id number] Date of Evaluation:  10/20/2015 Chief Complaint:  SCHIZOAFFECTIVE DISORDER MDD RECURRENT EPISODE WITH PSYCHOTIC FEATURES COCAINE USE DISORDER Principal Diagnosis: Schizoaffective disorder, depressive type ([place]) Diagnosis:   Patient Active Problem List   Diagnosis Date Noted  . Schizoaffective disorder, depressive type ([place]) [F25.1] 10/19/2015    Priority: High  . Schizoaffective disorder, bipolar type ([place]) [F25.0] 04/03/2015    Priority: High  . Homicidal ideation [R45.850]   . Tobacco use disorder [F17.200] 07/20/2015  . Diabetes mellitus ([place]) [E11.9] 07/20/2015  . Suicidal ideation [R45.851]   . Cocaine use disorder, moderate, dependence ([place]) [F14.20] 04/03/2015  . Alcohol use disorder, moderate, dependence ([place]) [F10.20] 04/03/2015  . Acute ischemic stroke ([place]) [I63.9] 12/08/2014  . Left-sided weakness [M62.89] 12/08/2014  . 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 Knee pain [M25.569] 10/17/2010  . MAMMOGRAM, ABNORMAL, RIGHT [R92.8] 08/05/2010  . Dyslipidemia [E78.5] 06/21/2010  . AMENORRHEA, SECONDARY [N91.2] 10/29/2009  . 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 GERD [K21.9] 10/11/2007  . Essential hypertension [I10] 02/26/2007   History of Present Illness:  Maureen Swanson is a 50 y.o. female who voluntarily presents to [facility] with c/o SI w/ a plan and command AH.  She is well known to [facility] from multiple admissions, last on Jan '17.  She presented during the assessment as calm and cooperative.  It was reported in the chart that she was having SI for @ 2 months, but indicated that it has become stronger recently.  She also reported having plans to cut or shoot herself. Pt also endorsed having AH telling her to harm people and step out into traffic. Pt reported having these command voices for @ a month but could not explain how she was able to resist obeying the voices thus far.  Pt indicated that she has not been med compliant, but could not offer a reason as to why she has not taken her medication. Pt reported last seeing her psychiatrist 2 weeks ago. Pt mentioned a desire to get into [facility] but Probation officer informed pt that [facility] would not accept her for admission as long as she is actively suicidal and experiencing command [place].   Today, she was seen.  She was pleasant and cooperative.  She states that she has passive SI, no HI and AVH.  She states that her boyfriend put her out of his house and her sister is dying.      Associated Signs/Symptoms: Depression Symptoms:  depressed mood, anxiety, (Hypo) Manic Symptoms:  Impulsivity, Irritable Mood, Labiality of Mood, Anxiety Symptoms:  Excessive Worry, Psychotic Symptoms:  Hallucinations: Auditory PTSD Symptoms: NA Total Time spent with patient: 1 hour  Past Psychiatric History: see above note   Is the patient at risk to self? Yes.    Has the patient been a risk to self in the past 6 months? Yes.    Has the patient been a risk to self within the distant past? Yes.    Is the patient a risk to others? Yes.    Has the patient been a risk to others in the past 6 months? No.  Has the patient been a risk to others within the distant past? No.   Prior Inpatient Therapy:   Prior Outpatient Therapy:    Alcohol Screening: 1. How often do you have a drink containing alcohol?: 4 or more times a week 2. How many drinks containing alcohol do you have on a  typical day when you are drinking?: 7, 8, or 9 3. How often do you have six or more drinks on one occasion?: Daily or almost daily Preliminary Score: 7 4. How often during the last year have you found that you were not able to stop drinking once you had started?: Monthly 5. How often during the last year have you failed to do what was normally expected from you becasue of drinking?: Monthly 6. How often during the last year have you needed a first drink in the morning to get  yourself going after a heavy drinking session?: Monthly 7. How often during the last year have you had a feeling of guilt of remorse after drinking?: Weekly 8. How often during the last year have you been unable to remember what happened the night before because you had been drinking?: Weekly 9. Have you or someone else been injured as a result of your drinking?: No 10. Has a relative or friend or a doctor or another health worker been concerned about your drinking or suggested you cut down?: Yes, but not in the last year Alcohol Use Disorder Identification Test Final Score (AUDIT): 25 Brief Intervention: Yes Substance Abuse History in the last 12 months:  Yes.   Consequences of Substance Abuse: Inpatient admissions Previous Psychotropic Medications: Yes  Psychological Evaluations: Yes  Past Medical History:  Past Medical History  Diagnosis Date  . High cholesterol   . Depression   . Gout   . Anxiety   . Bipolar 1 disorder ([place])   . GERD (gastroesophageal reflux disease)   . Substance abuse     crack cocaine  . Headache(784.0)     migraines  . TMJ (temporomandibular joint disorder)   . Asthma     daily and prn inhalers  . Arthritis     back and knees  . Gastric ulcer   . 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 Hypertension     under control, has been on med. x 12 yrs.  . Diabetes mellitus     diet-controlled  . CHF (congestive heart failure) [facility])     Past Surgical History  Procedure Laterality Date  . Knee arthroscopy w/ partial medial meniscectomy  05/01/2010    right  . Breast lumpectomy with needle localization  04/19/2012    Procedure: BREAST LUMPECTOMY WITH NEEDLE LOCALIZATION;  Surgeon: [name], MD;  Location: [facility];  Service: General;  Laterality: Right;   Family History:  Family History  Problem Relation Age of Onset  . Diabetes Mother   . Breast cancer Mother   . Cancer Father   . Bipolar disorder Maternal Aunt    . Schizophrenia Maternal Grandfather   . Alcoholism Maternal Uncle    Family Psychiatric  History: see above noted Tobacco Screening: @FLOW ([phone])::1)@ Social History:  History  Alcohol Use  . 0.0 oz/week    Comment: reports she drinks 3 40 ounce beers daily     History  Drug Use  . Yes  . Special: Cocaine    Comment: 09/28/2015    Additional Social History:      Allergies:   Allergies  Allergen Reactions  . Chocolate Hives  . Orange Hives    "Acid foods"  . Penicillins Hives    Has patient had a PCN reaction causing immediate rash, facial/tongue/throat swelling, SOB or lightheadedness with hypotension: Yes Has patient had a PCN reaction causing severe rash involving mucus membranes or skin  necrosis: Yes Has patient had a PCN reaction that required hospitalization No Has patient had a PCN reaction occurring within the last 10 years: No If all of the above answers are "NO", then may proceed with Cephalosporin use.   . Other Swelling    strawberries  . Tomato Hives    "acid foods"   Lab Results:  Results for orders placed or performed during the hospital encounter of 10/19/15 (from the past 48 hour(s))  Glucose, capillary     Status: Abnormal   Collection Time: 10/19/15  5:34 PM  Result Value Ref Range   Glucose-Capillary 118 (H) 65 - 99 mg/dL   Comment 1 Notify RN   Glucose, capillary     Status: Abnormal   Collection Time: 10/19/15  8:55 PM  Result Value Ref Range   Glucose-Capillary 116 (H) 65 - 99 mg/dL  Glucose, capillary     Status: Abnormal   Collection Time: 10/20/15  6:27 AM  Result Value Ref Range   Glucose-Capillary 120 (H) 65 - 99 mg/dL  Glucose, capillary     Status: None   Collection Time: 10/20/15 10:05 AM  Result Value Ref Range   Glucose-Capillary 89 65 - 99 mg/dL    Blood Alcohol level:  Lab Results  Component Value Date   ETH <5 10/18/2015   ETH <5 [id number]    Metabolic Disorder Labs:  Lab Results  Component Value Date    HGBA1C 5.4 08/10/2015   MPG 120 08/07/2015   MPG 117 07/21/2015   Lab Results  Component Value Date   PROLACTIN 5.7 08/10/2015   PROLACTIN 141.3* 07/21/2015   Lab Results  Component Value Date   CHOL 159 08/10/2015   TRIG 122 08/10/2015   HDL 48 08/10/2015   CHOLHDL 3.3 08/10/2015   VLDL 24 08/10/2015   LDLCALC 87 08/10/2015   LDLCALC 63 07/21/2015    Current Medications: Current Facility-Administered Medications  Medication Dose Route Frequency Provider Last Rate Last Dose  . acetaminophen (TYLENOL) tablet 650 mg  650 mg Oral Q6H PRN Patrecia Pour, NP   650 mg at 10/20/15 1114  . albuterol (PROVENTIL HFA;VENTOLIN HFA) 108 (90 Base) MCG/ACT inhaler 2 puff  2 puff Inhalation Q6H PRN Patrecia Pour, NP      . alum & mag hydroxide-simeth (MAALOX/MYLANTA) 200-200-20 MG/5ML suspension 30 mL  30 mL Oral Q4H PRN Patrecia Pour, NP      . amLODipine (NORVASC) tablet 5 mg  5 mg Oral Daily Patrecia Pour, NP   5 mg at 10/20/15 0840  . ARIPiprazole (ABILIFY) tablet 5 mg  5 mg Oral Daily Patrecia Pour, NP   5 mg at 10/20/15 0840  . aspirin chewable tablet 81 mg  81 mg Oral Daily Patrecia Pour, NP   81 mg at 10/20/15 [id number]  . atenolol (TENORMIN) tablet 100 mg  100 mg Oral Daily Patrecia Pour, NP   100 mg at 10/20/15 0841  . atorvastatin (LIPITOR) tablet 40 mg  40 mg Oral QPM Patrecia Pour, NP   40 mg at 10/19/15 1731  . baclofen (LIORESAL) tablet 10 mg  10 mg Oral TID Patrecia Pour, NP   10 mg at 10/20/15 1326  . calcium carbonate (OS-CAL - dosed in mg of elemental calcium) tablet 500 mg of elemental calcium  1 tablet Oral Q breakfast Patrecia Pour, NP   500 mg of elemental calcium at 10/20/15 0840  . celecoxib (CELEBREX) capsule 100 mg  100 mg Oral BID Patrecia Pour, NP   100 mg at 10/20/15 0854  . furosemide (LASIX) tablet 40 mg  40 mg Oral Daily Patrecia Pour, NP   40 mg at 10/20/15 0840  . gabapentin (NEURONTIN) capsule 400 mg  400 mg Oral BID Patrecia Pour, NP   400 mg at 10/20/15  0840  . hydrOXYzine (ATARAX/VISTARIL) tablet 25 mg  25 mg Oral Q6H PRN Nicholaus Bloom, MD      . indomethacin (INDOCIN) capsule 25 mg  25 mg Oral TID PRN Patrecia Pour, NP      . insulin glargine (LANTUS) injection 30 Units  30 Units Subcutaneous QHS Kerrie Buffalo, NP      . levofloxacin [facility]) tablet 750 mg  750 mg Oral Daily Patrecia Pour, NP   750 mg at 10/20/15 0840  . linagliptin (TRADJENTA) tablet 5 mg  5 mg Oral Daily Patrecia Pour, NP   5 mg at 10/20/15 0840  . loperamide (IMODIUM) capsule 2-4 mg  2-4 mg Oral PRN Nicholaus Bloom, MD      . loratadine (CLARITIN) tablet 10 mg  10 mg Oral Daily Patrecia Pour, NP   10 mg at 10/20/15 0840  . LORazepam (ATIVAN) tablet 1 mg  1 mg Oral Q6H PRN Nicholaus Bloom, MD      . LORazepam (ATIVAN) tablet 1 mg  1 mg Oral QID Nicholaus Bloom, MD   1 mg at 10/20/15 1326   Followed by  . [START ON 10/21/2015] LORazepam (ATIVAN) tablet 1 mg  1 mg Oral TID Nicholaus Bloom, MD       Followed by  . [START ON 10/22/2015] LORazepam (ATIVAN) tablet 1 mg  1 mg Oral BID Nicholaus Bloom, MD       Followed by  . [START ON 10/24/2015] LORazepam (ATIVAN) tablet 1 mg  1 mg Oral Daily Nicholaus Bloom, MD      . losartan (COZAAR) tablet 25 mg  25 mg Oral BID Patrecia Pour, NP   25 mg at 10/20/15 0841  . magnesium hydroxide (MILK OF MAGNESIA) suspension 30 mL  30 mL Oral Daily PRN Patrecia Pour, NP      . metFORMIN (GLUCOPHAGE) tablet 1,000 mg  1,000 mg Oral BID WC Patrecia Pour, NP   1,000 mg at 10/20/15 0841  . multivitamin with minerals tablet 1 tablet  1 tablet Oral Daily Nicholaus Bloom, MD   1 tablet at 10/20/15 1326  . ondansetron (ZOFRAN-ODT) disintegrating tablet 4 mg  4 mg Oral Q6H PRN Nicholaus Bloom, MD      . Oxcarbazepine (TRILEPTAL) tablet 300 mg  300 mg Oral BID Patrecia Pour, NP   300 mg at 10/20/15 0841  . [START ON 10/21/2015] thiamine (VITAMIN B-1) tablet 100 mg  100 mg Oral Daily Nicholaus Bloom, MD      . traZODone (DESYREL) tablet 150 mg  150 mg Oral QHS Nicholaus Bloom, MD       PTA Medications: Prescriptions prior to admission  Medication Sig Dispense Refill Last Dose  . albuterol (PROVENTIL HFA;VENTOLIN HFA) 108 (90 Base) MCG/ACT inhaler Inhale 2 puffs into the lungs every 6 (six) hours as needed for wheezing or shortness of breath. 1 Inhaler 0 Past Week at Unknown time  . amLODipine (NORVASC) 5 MG tablet Take 1 tablet (5 mg total) by mouth daily. 30 tablet 0 Past Week at Unknown time  .  ARIPiprazole (ABILIFY) 10 MG tablet Take 1 tablet (10 mg total) by mouth daily. (Patient not taking: Reported on 09/30/2015) 60 tablet 0 Not Taking at Unknown time  . ARIPiprazole 400 MG SUSR Inject 400 mg into the muscle every 28 (twenty-eight) days. (Patient not taking: Reported on 09/30/2015) 1 each 0 Not Taking at Unknown time  . ARIPiprazole 400 MG SUSR Inject 400 mg into the muscle every 28 (twenty-eight) days. Last dose given 07/29/2014.  Next dose due 08/27/2015 1 each 0 10/04/2015  . aspirin 81 MG tablet Take 1 tablet (81 mg total) by mouth daily. 30 tablet 0 Past Week at Unknown time  . atenolol (TENORMIN) 100 MG tablet Take 100 mg by mouth daily.   10/11/2015 at 1200  . atorvastatin (LIPITOR) 40 MG tablet Take 1 tablet (40 mg total) by mouth every evening. 30 tablet 0 Past Week at Unknown time  . azithromycin (ZITHROMAX Z-PAK) 250 MG tablet 2 po day one, then 1 daily x 4 days (Patient not taking: Reported on 10/18/2015) 5 tablet 0 Completed Course at Unknown time  . baclofen (LIORESAL) 10 MG tablet Take 10 mg by mouth 3 (three) times daily.   Past Week at Unknown time  . calcium carbonate (OS-CAL - DOSED IN MG OF ELEMENTAL CALCIUM) 1250 (500 Ca) MG tablet Take 1 tablet (500 mg of elemental calcium total) by mouth daily with breakfast. 30 tablet 0 Past Week at Unknown time  . celecoxib (CELEBREX) 100 MG capsule Take 100 mg by mouth 2 (two) times daily.   Past Week at Unknown time  . cetirizine (ZYRTEC) 10 MG tablet Take 10 mg by mouth daily.   Past Week at Unknown time   . Dexlansoprazole (DEXILANT) 30 MG capsule Take 30 mg by mouth daily.   Past Week at Unknown time  . furosemide (LASIX) 40 MG tablet Take 1 tablet (40 mg total) by mouth daily. 30 tablet 0 Past Week at Unknown time  . gabapentin (NEURONTIN) 400 MG capsule Take 1 capsule (400 mg total) by mouth 2 (two) times daily. 60 capsule 0 Past Week at Unknown time  . glipiZIDE (GLUCOTROL) 10 MG tablet Take 10 mg by mouth 2 (two) times daily before a meal.   Past Week at Unknown time  . hydrOXYzine (ATARAX/VISTARIL) 25 MG tablet Take 1 tablet (25 mg total) by mouth every 6 (six) hours as needed for anxiety. 90 tablet 0 Past Week at Unknown time  . indomethacin (INDOCIN) 25 MG capsule Take 25 mg by mouth 3 (three) times daily as needed for mild pain.   Past Week at Unknown time  . linagliptin (TRADJENTA) 5 MG TABS tablet Take 1 tablet (5 mg total) by mouth daily. (Patient not taking: Reported on 09/30/2015) 30 tablet 0 Not Taking at Unknown time  . losartan (COZAAR) 25 MG tablet Take 25 mg by mouth 2 (two) times daily.   Past Week at Unknown time  . metFORMIN (GLUCOPHAGE) 1000 MG tablet Take 1 tablet (1,000 mg total) by mouth 2 (two) times daily with a meal. 60 tablet 0 Past Week at Unknown time  . ondansetron (ZOFRAN) 4 MG tablet Take 1 tablet (4 mg total) by mouth every 6 (six) hours. (Patient not taking: Reported on 10/18/2015) 12 tablet 0 Not Taking at Unknown time  . Oxcarbazepine (TRILEPTAL) 300 MG tablet Take 1 tablet (300 mg total) by mouth 2 (two) times daily. 60 tablet 0 Past Week at Unknown time  . pantoprazole (PROTONIX) 40 MG tablet Take 1  tablet (40 mg total) by mouth 2 (two) times daily before a meal. (Patient not taking: Reported on 09/30/2015) 60 tablet 0 Not Taking at Unknown time  . QUEtiapine (SEROQUEL XR) 400 MG 24 hr tablet Take 1 tablet (400 mg total) by mouth at bedtime. (Patient not taking: Reported on 09/30/2015) 30 tablet 0 Not Taking at Unknown time  . sitaGLIPtin (JANUVIA) 100 MG tablet Take  100 mg by mouth daily.   Past Week at Unknown time    Musculoskeletal: Strength & Muscle Tone: within normal limits Gait & Station: normal Patient leans: N/A  Psychiatric Specialty Exam: Physical Exam  Vitals reviewed. Psychiatric: Her mood appears anxious. She exhibits a depressed mood.    Review of Systems  Psychiatric/Behavioral: Positive for depression. Negative for hallucinations. Suicidal ideas: passive.  All other systems reviewed and are negative.   Blood pressure 131/88, pulse 71, temperature 98.7 F (37.1 C), temperature source Oral, resp. rate 16, height 5' 3.78" (1.62 m), weight 96.163 kg (212 lb), SpO2 98 %.Body mass index is 36.64 kg/(m^2).   General Appearance: Casual  Eye Contact:: Fair  Speech: Normal Rate  Volume: Normal  Mood: Depressed and Irritable  Affect: Congruent  Thought Process: Coherent  Orientation: Full (Time, Place, and Person)  Thought Content: Rumination  Suicidal Thoughts: Yes. with intent/plan  Homicidal Thoughts: Yes. without intent/plan  Memory: Immediate; Fair Recent; Fair Remote; Fair  Judgement: Fair  Insight: Fair  Psychomotor Activity: Decreased  Concentration: Fair  Recall: [employer] of Knowledge:Fair  Language: Fair  Akathisia: No  Handed: Right  AIMS (if indicated):    Assets: Leisure Time Resilience  ADL's: Intact  Cognition: WNL  Sleep:  poor   Treatment Plan Summary: Daily contact with patient to assess and evaluate symptoms and progress in treatment, Medication management and Plan schizoaffective disorder, depressed type:  -Crisis stabilization -Medication management: Continue medical medications and start Trileptal 300 mg BID for mood stabilization, Vistaril 25 mg every six hours PRN anxiety, Gabapentin 400 mg BID for anxiety/withdrawal, and Abilify 5 mg daily for depression. -Individual and substance abuse counseling      Observation  Level/Precautions:  15 minute checks  Laboratory:  per ED  Psychotherapy:    Medications:    Consultations:    Discharge Concerns:    Estimated LOS:  Other:     I certify that inpatient services furnished can reasonably be expected to improve the patient's condition.    Janett Labella, NP [facility] 5/6/20171:51 PM I personally assessed the patient and formulated the plan Geralyn Flash A. Sabra Heck, M.D.

## 2015-10-20 NOTE — [facility] Group Notes (Signed)
[place] Group Notes:  (Nursing/MHT/Case Management/Adjunct)  Date:  10/20/2015  Time:  6:04 PM  Type of Therapy:  Nurse Education  Participation Level:  Active  Participation Quality:  Appropriate  Affect:  Appropriate  Cognitive:  Appropriate  Insight:  Improving  Engagement in Group:  Developing/Improving  Modes of Intervention:  Education  Summary of Progress/Problems: Patient was able to attend group without disruption.  Patient added pertinent input into groups.  Maureen Swanson 10/20/2015, 6:04 PM

## 2015-10-20 NOTE — [facility] Group Notes (Signed)
[place] Group Notes:  (Clinical Social Work)  10/20/2015  11:15-12:00PM  Summary of Progress/Problems:   Today's process group involved patients discussing their feelings related to being hospitalized, as well as how they can use their present feelings to create goals for themselves. The patient expressed her primary goal is to listen to other people and see if there are ways they can help her or she can help them.  She was very supportive of other female patients during group.  Type of Therapy:  Group Therapy - Process  Participation Level:  Active  Participation Quality:  Attentive and Supportive  Affect:  Blunted  Cognitive:  Appropriate  Insight:  Developing/Improving  Engagement in Therapy:  Engaged  Modes of Intervention:  Exploration, Discussion  Selmer Dominion, LCSW 10/20/2015, 12:45 PM
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
## 2015-10-20 NOTE — Progress Notes (Signed)
Adult Psychoeducational Group Note  Date:  10/20/2015 Time:  8:47 PM  Group Topic/Focus:  Wrap-Up Group:   The focus of this group is to help patients review their daily goal of treatment and discuss progress on daily workbooks.  Participation Level:  Active  Participation Quality:  Appropriate  Affect:  Appropriate  Cognitive:  Appropriate  Insight: Appropriate  Engagement in Group:  Engaged  Modes of Intervention:  Discussion  Additional Comments: The patient expressed that she had a good day.The patient also sad in group she talked about medications.  Nash Shearer 10/20/2015, 8:47 PM

## 2015-10-20 NOTE — Progress Notes (Signed)
D- Patient endorses auditory hallucinations, but denies SI, HI and VH.  Patient's toenail has broken off of her big toe and cause her pain the toe was covered with gauze and tape.   A-  Assess patient for safety, offer medications as prescribed, engage patient in 1:1 staff talks  R-  Patient able to contract for safety.
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
## 2015-10-20 NOTE — Plan of Care (Signed)
Problem: Alteration in mood & ability to function due to Goal: STG-Patient will attend groups Outcome: Progressing Patient attended evening group.

## 2015-10-21 LAB — GLUCOSE, CAPILLARY
GLUCOSE-CAPILLARY: 127 mg/dL — AB (ref 65–99)
Glucose-Capillary: 114 mg/dL — ABNORMAL HIGH (ref 65–99)
Glucose-Capillary: 86 mg/dL (ref 65–99)
Glucose-Capillary: 153 mg/dL — ABNORMAL HIGH (ref 65–99)

## 2015-10-21 MED ORDER — TRAZODONE HCL 100 MG PO TABS
200.0000 mg | ORAL_TABLET | Freq: Every day | ORAL | Status: DC
  Administered 2015-10-21 – 2015-10-28 (×7): 200 mg via ORAL
  Filled 2015-10-21 (×6): qty 2
  Filled 2015-10-21: qty 14
  Filled 2015-10-21 (×3): qty 2

## 2015-10-21 MED ORDER — PNEUMOCOCCAL VAC POLYVALENT 25 MCG/0.5ML IJ INJ
0.5000 mL | INJECTION | INTRAMUSCULAR | Status: AC
  Administered 2015-10-22: 0.5 mL via INTRAMUSCULAR

## 2015-10-21 MED ORDER — NICOTINE 21 MG/24HR TD PT24
21.0000 mg | MEDICATED_PATCH | Freq: Every day | TRANSDERMAL | Status: DC
  Administered 2015-10-21 – 2015-10-29 (×8): 21 mg via TRANSDERMAL
  Filled 2015-10-21 (×11): qty 1

## 2015-10-21 NOTE — Progress Notes (Signed)
Writer entered patients room and observed her sleeping. Writer aroused her and encouraged her to attend group earlier and try to stay up for a little so that she would be able to rest tonight, she did not attend group but came to the dayroom for snack and returned to her room after taking her medication. Safety maintained on unit with 15 min checks.

## 2015-10-21 NOTE — Progress Notes (Signed)
[facility] MD Progress Note  10/21/2015 4:16 PM Maureen Swanson  MRN:  [id number] Subjective:  Patient reports " I am still having thoughts to cut myself"   Objective: Maureen Swanson is awake, alert and oriented X4 , found attending group session. Patient reports  suicidal ideation that come and goes. Patient is able to contract for safety. Patient denies homicidal ideation. Denies auditory or visual hallucination and does not appear to be responding to internal stimuli. Patient interacts well with staff and others. Patient reports she is medication compliant without mediation side effects reports she was just started this medication two day ago. States her depression 9/10. Patient states "I am just not feeling not myself today, but am always sad and depressed.  Reports decreased  appetite and states she is not resting well throughout the night. Patient is requesting Pneumococcal vaccine.Support, encouragement and reassurance was provided.   Principal Problem: Schizoaffective disorder, depressive type ([place]) Diagnosis:   Patient Active Problem List   Diagnosis Date Noted  . Schizoaffective disorder, depressive type ([place]) [F25.1] 10/19/2015  . Homicidal ideation [R45.850]   . Tobacco use disorder [F17.200] 07/20/2015  . Diabetes mellitus ([place]) [E11.9] 07/20/2015  . Suicidal ideation [R45.851]   . Cocaine use disorder, moderate, dependence ([place]) [F14.20] 04/03/2015  . Schizoaffective disorder, bipolar type ([place]) [F25.0] 04/03/2015  . Alcohol use disorder, moderate, dependence ([place]) [F10.20] 04/03/2015  . Acute ischemic stroke ([place]) [I63.9] 12/08/2014  . Left-sided weakness [M62.89] 12/08/2014  . 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 Knee pain [M25.569] 10/17/2010  . MAMMOGRAM, ABNORMAL, RIGHT [R92.8] 08/05/2010  . Dyslipidemia [E78.5] 06/21/2010  . AMENORRHEA, SECONDARY [N91.2] 10/29/2009  . 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 GERD [K21.9] 10/11/2007  . Essential hypertension [I10]  02/26/2007   Total Time spent with patient: 30 minutes  Past Psychiatric History: See Above  Past Medical History:  Past Medical History  Diagnosis Date  . High cholesterol   . Depression   . Gout   . Anxiety   . Bipolar 1 disorder ([place])   . GERD (gastroesophageal reflux disease)   . Substance abuse     crack cocaine  . Headache(784.0)     migraines  . TMJ (temporomandibular joint disorder)   . Asthma     daily and prn inhalers  . Arthritis     back and knees  . Gastric ulcer   . 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 Hypertension     under control, has been on med. x 12 yrs.  . Diabetes mellitus     diet-controlled  . CHF (congestive heart failure) [facility])     Past Surgical History  Procedure Laterality Date  . Knee arthroscopy w/ partial medial meniscectomy  05/01/2010    right  . Breast lumpectomy with needle localization  04/19/2012    Procedure: BREAST LUMPECTOMY WITH NEEDLE LOCALIZATION;  Surgeon: [name], MD;  Location: [place] [facility];  Service: General;  Laterality: Right;   Family History:  Family History  Problem Relation Age of Onset  . Diabetes Mother   . Breast cancer Mother   . Cancer Father   . Bipolar disorder Maternal Aunt   . Schizophrenia Maternal Grandfather   . Alcoholism Maternal Uncle    Family Psychiatric  History: See Above Social History:  History  Alcohol Use  . 0.0 oz/week    Comment: reports she drinks 3 40 ounce beers daily     History  Drug Use  . Yes  . Special: Cocaine    Comment: 09/28/2015    Social History   Social History  . Marital Status: Single    Spouse Name: N/A  . Number of Children: N/A  . Years of Education: N/A   Social History Main Topics  . Smoking status: Current Every Day Smoker -- 0.50 packs/day for 15 years    Types: Cigarettes    Last Attempt to Quit: 08/02/2012  . Smokeless tobacco: Never Used  . Alcohol Use: 0.0 oz/week     Comment: reports she drinks 3  40 ounce beers daily  . Drug Use: Yes    Special: Cocaine     Comment: 09/28/2015  . Sexual Activity: No   Other Topics Concern  . None   Social History Narrative   Additional Social History:                         Sleep: Poor  Appetite:  Fair  Current Medications: Current Facility-Administered Medications  Medication Dose Route Frequency Provider Last Rate Last Dose  . acetaminophen (TYLENOL) tablet 650 mg  650 mg Oral Q6H PRN Patrecia Pour, NP   650 mg at 10/21/15 1039  . albuterol (PROVENTIL HFA;VENTOLIN HFA) 108 (90 Base) MCG/ACT inhaler 2 puff  2 puff Inhalation Q6H PRN Patrecia Pour, NP      . alum & mag hydroxide-simeth (MAALOX/MYLANTA) 200-200-20 MG/5ML suspension 30 mL  30 mL Oral Q4H PRN Patrecia Pour, NP      . amLODipine (NORVASC) tablet 5 mg  5 mg Oral Daily Patrecia Pour, NP   5 mg at 10/21/15 [id number]  . ARIPiprazole (ABILIFY) tablet 5 mg  5 mg Oral Daily Patrecia Pour, NP   5 mg at 10/21/15 0839  . aspirin chewable tablet 81 mg  81 mg Oral Daily Patrecia Pour, NP   81 mg at 10/21/15 [id number]  . atenolol (TENORMIN) tablet 100 mg  100 mg Oral Daily Patrecia Pour, NP   100 mg at 10/21/15 0835  . atorvastatin (LIPITOR) tablet 40 mg  40 mg Oral QPM Patrecia Pour, NP   40 mg at 10/20/15 1714  . baclofen (LIORESAL) tablet 10 mg  10 mg Oral TID Patrecia Pour, NP   10 mg at 10/21/15 1209  . calcium carbonate (OS-CAL - dosed in mg of elemental calcium) tablet 500 mg of elemental calcium  1 tablet Oral Q breakfast Patrecia Pour, NP   500 mg of elemental calcium at 10/21/15 0835  . celecoxib (CELEBREX) capsule 100 mg  100 mg Oral BID Patrecia Pour, NP   100 mg at 10/21/15 [id number]  . furosemide (LASIX) tablet 40 mg  40 mg Oral Daily Patrecia Pour, NP   40 mg at 10/21/15 0837  . gabapentin (NEURONTIN) capsule 400 mg  400 mg Oral BID Patrecia Pour, NP   400 mg at 10/21/15 0837  . hydrOXYzine (ATARAX/VISTARIL) tablet 25 mg  25 mg Oral Q6H PRN Nicholaus Bloom, MD   25 mg at  10/21/15 1447  . indomethacin (INDOCIN) capsule 25 mg  25 mg Oral TID PRN Patrecia Pour, NP      . insulin glargine (LANTUS) injection 30 Units  30 Units Subcutaneous QHS Kerrie Buffalo, NP   30 Units at 10/20/15 2140  . levofloxacin (LEVAQUIN) tablet 750 mg  750 mg Oral Daily Patrecia Pour, NP   750  mg at 10/21/15 0846  . linagliptin (TRADJENTA) tablet 5 mg  5 mg Oral Daily Patrecia Pour, NP   5 mg at 10/21/15 0835  . loperamide (IMODIUM) capsule 2-4 mg  2-4 mg Oral PRN Nicholaus Bloom, MD      . loratadine (CLARITIN) tablet 10 mg  10 mg Oral Daily Patrecia Pour, NP   10 mg at 10/21/15 0837  . LORazepam (ATIVAN) tablet 1 mg  1 mg Oral Q6H PRN Nicholaus Bloom, MD      . LORazepam (ATIVAN) tablet 1 mg  1 mg Oral TID Nicholaus Bloom, MD   1 mg at 10/21/15 1209   Followed by  . [START ON 10/22/2015] LORazepam (ATIVAN) tablet 1 mg  1 mg Oral BID Nicholaus Bloom, MD       Followed by  . [START ON 10/24/2015] LORazepam (ATIVAN) tablet 1 mg  1 mg Oral Daily Nicholaus Bloom, MD      . losartan (COZAAR) tablet 25 mg  25 mg Oral BID Patrecia Pour, NP   25 mg at 10/21/15 [id number]  . magnesium hydroxide (MILK OF MAGNESIA) suspension 30 mL  30 mL Oral Daily PRN Patrecia Pour, NP      . metFORMIN (GLUCOPHAGE) tablet 1,000 mg  1,000 mg Oral BID WC Patrecia Pour, NP   1,000 mg at 10/21/15 0837  . multivitamin with minerals tablet 1 tablet  1 tablet Oral Daily Nicholaus Bloom, MD   1 tablet at 10/21/15 0840  . nicotine (NICODERM CQ - dosed in mg/24 hours) patch 21 mg  21 mg Transdermal Daily Derrill Center, NP      . ondansetron (ZOFRAN-ODT) disintegrating tablet 4 mg  4 mg Oral Q6H PRN Nicholaus Bloom, MD      . Oxcarbazepine (TRILEPTAL) tablet 300 mg  300 mg Oral BID Patrecia Pour, NP   300 mg at 10/21/15 0835  . [START ON 10/22/2015] pneumococcal 23 valent vaccine (PNU-IMMUNE) injection 0.5 mL  0.5 mL Intramuscular Tomorrow-1000 Derrill Center, NP      . thiamine (VITAMIN B-1) tablet 100 mg  100 mg Oral Daily Nicholaus Bloom,  MD   100 mg at 10/21/15 0837  . traZODone (DESYREL) tablet 200 mg  200 mg Oral QHS Derrill Center, NP        Lab Results:  Results for orders placed or performed during the hospital encounter of 10/19/15 (from the past 48 hour(s))  Glucose, capillary     Status: Abnormal   Collection Time: 10/19/15  5:34 PM  Result Value Ref Range   Glucose-Capillary 118 (H) 65 - 99 mg/dL   Comment 1 Notify RN   Glucose, capillary     Status: Abnormal   Collection Time: 10/19/15  8:55 PM  Result Value Ref Range   Glucose-Capillary 116 (H) 65 - 99 mg/dL  Glucose, capillary     Status: Abnormal   Collection Time: 10/20/15  6:27 AM  Result Value Ref Range   Glucose-Capillary 120 (H) 65 - 99 mg/dL  Glucose, capillary     Status: None   Collection Time: 10/20/15 10:05 AM  Result Value Ref Range   Glucose-Capillary 89 65 - 99 mg/dL  Glucose, capillary     Status: Abnormal   Collection Time: 10/20/15  4:45 PM  Result Value Ref Range   Glucose-Capillary 127 (H) 65 - 99 mg/dL  Glucose, capillary     Status: Abnormal   Collection  Time: 10/20/15  8:34 PM  Result Value Ref Range   Glucose-Capillary 120 (H) 65 - 99 mg/dL  Glucose, capillary     Status: Abnormal   Collection Time: 10/21/15  5:54 AM  Result Value Ref Range   Glucose-Capillary 114 (H) 65 - 99 mg/dL  Glucose, capillary     Status: None   Collection Time: 10/21/15 12:14 PM  Result Value Ref Range   Glucose-Capillary 86 65 - 99 mg/dL    Blood Alcohol level:  Lab Results  Component Value Date   ETH <5 10/18/2015   ETH <5 08/08/2015    Physical Findings: AIMS: Facial and Oral Movements Muscles of Facial Expression: None, normal Lips and Perioral Area: None, normal Jaw: None, normal Tongue: None, normal,Extremity Movements Upper (arms, wrists, hands, fingers): None, normal Lower (legs, knees, ankles, toes): None, normal, Trunk Movements Neck, shoulders, hips: None, normal, Overall Severity Severity of abnormal movements (highest  score from questions above): None, normal Incapacitation due to abnormal movements: None, normal Patient's awareness of abnormal movements (rate only patient's report): No Awareness, Dental Status Current problems with teeth and/or dentures?: No Does patient usually wear dentures?: No  CIWA:  CIWA-Ar Total: 4 COWS:     Musculoskeletal: Strength & Muscle Tone: within normal limits Gait & Station: normal Patient leans: N/A  Psychiatric Specialty Exam: Review of Systems  Psychiatric/Behavioral: Positive for depression, suicidal ideas and hallucinations.  All other systems reviewed and are negative.   Blood pressure 132/96, pulse 80, temperature 98.7 F (37.1 C), temperature source Oral, resp. rate 16, height 5' 3.78" (1.62 m), weight 96.163 kg (212 lb), SpO2 98 %.Body mass index is 36.64 kg/(m^2).  General Appearance: Casual  Eye Contact::  Fair  Speech:  Clear and Coherent  Volume:  Normal  Mood:  Anxious and Depressed  Affect:  Congruent  Thought Process:  Coherent  Orientation:  Full (Time, Place, and Person)  Thought Content:  Hallucinations: None  Suicidal Thoughts:  Yes.  with intent/plan  Homicidal Thoughts:  No  Memory:  Immediate;   Fair Recent;   Fair Remote;   Fair  Judgement:  Fair  Insight:  Present  Psychomotor Activity:  Restlessness  Concentration:  Fair  Recall:  [employer] of Knowledge:Fair  Language: Fair  Akathisia:  No  Handed:  Right  AIMS (if indicated):     Assets:  Communication Skills Desire for Improvement Resilience  ADL's:  Intact  Cognition: WNL  Sleep:  Number of Hours: 6    I agree with current treatment plan on 05/072017, Patient seen face-to-face for psychiatric evaluation follow-up, chart reviewed.  Reviewed the information documented and agree with the treatment plan.  Treatment Plan Summary: Daily contact with patient to assess and evaluate symptoms and progress in treatment and Medication management Daily contact with patient  to assess and evaluate symptoms and progress in treatment, Medication management and Plan schizoaffective disorder, depressed type:  -Crisis stabilization -Medication management: Continue medical medications and start  Continue Trileptal 300 mg BID for mood stabilization, Continue Vistaril 25 mg every six hours PRN anxiety,  continue Gabapentin 400 mg BID for anxiety/withdrawal Continue Abilify 5 mg daily for depression. Increased Trazodone 150 mg to 200 mg PO QHS for insomnia. -Individual and substance abuse counseling  Derrill Center, NP 10/21/2015, 4:16 PM I agree with assessment and plan Geralyn Flash A. Sabra Heck, M.D.

## 2015-10-21 NOTE — Plan of Care (Signed)
Problem: Diagnosis: Increased Risk For Suicide Attempt Goal: STG-Patient Will Comply With Medication Regime Outcome: Progressing Patient is compliant with scheduled medications.     

## 2015-10-21 NOTE — Progress Notes (Signed)
Patient has been up and active on the unit, attended group this evening and has voiced no complaints. Patient currently denies having pain, reports passive si and verbally contracts, -hi/a/v hall. Support and encouragement offered, safety maintained on unit, will continue to monitor.

## 2015-10-21 NOTE — BHH Group Notes (Signed)
[place] Group Notes:  (Clinical Social Work)  10/21/2015  11:00AM-12:00PM  Summary of Progress/Problems:  The main focus of today's process group was to listen to a variety of genres of music and to identify that different types of music provoke different responses.  The patient then was able to identify personally what was soothing for them, as well as energizing, as well as how patient can personally use this knowledge in sleep habits, with depression, and with other symptoms.  The patient expressed at the beginning of group the overall mood of "poor" but did not indicate whether she was sad or angry, or something else.  She actually slept much of group and at the end of group said that had helped her, and the music helped her to sleep.  Type of Therapy:  Music Therapy   Participation Level:  Minimal  Participation Quality:  Drowsy  Affect:  Flat and Depressed  Cognitive:  Oriented  Insight:  Limited  Engagement in Therapy:  Limited  Modes of Intervention:   Activity, Exploration  Selmer Dominion, LCSW 10/21/2015

## 2015-10-21 NOTE — Progress Notes (Signed)
DAR NOTE: Pt present with flat affect and depressed mood in the unit. Pt has been in the dayroom most of the time interacting with peers, did not attend the group. Pt complained of headache, took all her  meds as scheduled. As per self inventory, pt had a fair night sleep, fair appetite, low energy, and poor concentration. Pt rate depression at 8, hopeless ness at 8, and anxiety at a 8. Pt' goal for today is " self esteem, getting a better feeling of myself" Pt's safety ensured with 15 minute and environmental checks. Pt currently denies SI/HI and A/V hallucinations. Pt verbally agrees to seek staff if SI/HI or A/VH occurs and to consult with staff before acting on these thoughts. Will continue POC.

## 2015-10-21 NOTE — BHH Group Notes (Signed)
[place] Group Notes:  (Nursing/MHT/Case Management/Adjunct)  Date:  10/21/2015  Time:  4:39 PM   Type of Therapy:  Psychoeducational Skills  Participation Level:  Did Not Attend  Participation Quality:  DID NOT ATTEND  Affect:  DID NOT ATTEND  Cognitive:  DID NOT ATTEND  Insight:  None  Engagement in Group:  DID NOT ATTEND  Modes of Intervention:  DID NOT ATTEND  Summary of Progress/Problems: Pt did not attend patient self inventory group.   Maureen Swanson 10/21/2015, 4:39 PM

## 2015-10-21 NOTE — Progress Notes (Signed)
Adult Psychoeducational Group Note  Date:  10/21/2015 Time:  9:56 PM  Group Topic/Focus:  Wrap-Up Group:   The focus of this group is to help patients review their daily goal of treatment and discuss progress on daily workbooks.  Participation Level:  Did Not Attend  Participation Quality:  Did not attend  Affect:  Did not attend  Cognitive:  Did not attend  Insight: None  Engagement in Group:  Did not attend  Modes of Intervention:  Did not attend  Additional Comments:  Patient did not attend wrap up group tonight.   [place] L Maureen Swanson 10/21/2015, 9:56 PM

## 2015-10-22 LAB — GLUCOSE, CAPILLARY
GLUCOSE-CAPILLARY: 159 mg/dL — AB (ref 65–99)
GLUCOSE-CAPILLARY: 194 mg/dL — AB (ref 65–99)
Glucose-Capillary: 108 mg/dL — ABNORMAL HIGH (ref 65–99)
Glucose-Capillary: 110 mg/dL — ABNORMAL HIGH (ref 65–99)

## 2015-10-22 MED ORDER — QUETIAPINE FUMARATE 400 MG PO TABS
400.0000 mg | ORAL_TABLET | Freq: Every day | ORAL | Status: DC
  Administered 2015-10-22: 400 mg via ORAL
  Filled 2015-10-22 (×3): qty 1

## 2015-10-22 MED ORDER — ARIPIPRAZOLE ER 400 MG IM SUSR
400.0000 mg | INTRAMUSCULAR | Status: DC
Start: 2015-10-22 — End: 2015-10-29
  Administered 2015-10-22: 400 mg via INTRAMUSCULAR

## 2015-10-22 MED ORDER — ARIPIPRAZOLE 5 MG PO TABS
5.0000 mg | ORAL_TABLET | Freq: Every day | ORAL | Status: DC
  Administered 2015-10-23 – 2015-10-29 (×7): 5 mg via ORAL
  Filled 2015-10-22: qty 1
  Filled 2015-10-22: qty 7
  Filled 2015-10-22 (×7): qty 1

## 2015-10-22 MED ORDER — ARIPIPRAZOLE 10 MG PO TABS
10.0000 mg | ORAL_TABLET | Freq: Every day | ORAL | Status: DC
  Filled 2015-10-22 (×2): qty 1

## 2015-10-22 NOTE — BHH Group Notes (Signed)
[place] LCSW Group Therapy  10/22/2015 1:15 pm  Type of Therapy: Process Group Therapy  Participation Level:  Active  Participation Quality:  Appropriate  Affect:  Flat  Cognitive:  Oriented  Insight:  Improving  Engagement in Group:  Limited  Engagement in Therapy:  Limited  Modes of Intervention:  Activity, Clarification, Education, Problem-solving and Support  Summary of Progress/Problems: Today's group addressed the issue of overcoming obstacles.  Patients were asked to identify their biggest obstacle post d/c that stands in the way of their on-going success, and then problem solve as to how to manage this. Stayed the entire time, engaged throughout.  Minimal interaction.  "My biggest obstacle was when I didn't take care of my kids until they were 61 and 49.  Then I did because my mom told me she would call social services if I did not get involved.  I think I did pretty good because they are both doing OK now.  But it wasn't easy at the time."  [name] 10/22/2015   5:05 PM

## 2015-10-22 NOTE — Tx Team (Signed)
Interdisciplinary Treatment Plan Update (Adult)  Date:  10/22/2015   Time Reviewed:  8:42 AM   Progress in Treatment: Attending groups: Yes. Participating in groups:  Yes. Taking medication as prescribed:  Yes. Tolerating medication:  Yes. Family/Significant other contact made:  No Patient understands diagnosis:  Yes  As evidenced by seeking help with her usual complaints Discussing patient identified problems/goals with staff:  Yes, see initial care plan. Medical problems stabilized or resolved:  Yes. Denies suicidal/homicidal ideation: Yes. Issues/concerns per patient self-inventory:  No. Other:  New problem(s) identified:  Discharge Plan or Barriers: see below  Reason for Continuation of Hospitalization: Depression Hallucinations Medication stabilization  Comments:  Per [employer], pt reported SI, HI, auditory hallucinations, with plan to cut her wrist, pt did not have access to knife. Pt has hx of similar attempts. Calm, cooperative. Pt drinks about 6 tall beers per day, last drink yesterday. Reports using cocaine. Voices tell her to hurt other people and to jump in front of moving vehicles. Continue CIWA/Ativan protocol for alcohol withdrawal sx. Continue Trileptal 300 mg BID for mood stabilization, Continue Vistaril 25 mg every six hours PRN anxiety,  continue Gabapentin 400 mg BID for anxiety/withdrawal Continue Abilify 5 mg daily for psychosis. Will provide Abilify Maintena IM 400 mg x 1 dose today 10/22/15- repeat q28 days. Will restart seroquel 400 mg po qhs for psychosis/augmenting the effect of Abilify . Will continue Trazodone 200 mg PO QHS for insomnia.  Estimated length of stay: 4-5 days  New goal(s):  Review of initial/current patient goals per problem list:   Review of initial/current patient goals per problem list:  1. Goal(s): Patient will participate in aftercare plan   Met: No   Target date: 3-5 days post admission date   As evidenced by: Patient will  participate within aftercare plan AEB aftercare provider and housing plan at discharge being identified. 10/22/15:  Pt is asking for referral to [facility].  Also given information on [facility] programs in [place] and [place]   2. Goal (s): Patient will exhibit decreased depressive symptoms and suicidal ideations.   Met: No   Target date: 3-5 days post admission date   As evidenced by: Patient will utilize self rating of depression at 3 or below and demonstrate decreased signs of depression or be deemed stable for discharge by MD. 10/22/15:  Rates depression a 10 today     4. Goal(s): Patient will demonstrate decreased signs of withdrawal due to substance abuse   Met: Yes   Target date: 3-5 days post admission date   As evidenced by: Patient will produce a CIWA/COWS score of 0, have stable vitals signs, and no symptoms of withdrawal 10/22/15:  No signs nor symptoms of withdrawal today     5. Goal(s): Patient will demonstrate decreased signs of psychosis  * Met: No  * Target date: 3-5 days post admission date  * As evidenced by: Patient will demonstrate decreased frequency of AVH or return to baseline function 10/22/15:  C/O AH           Attendees: Patient:  10/22/2015 8:42 AM   Family:   10/22/2015 8:42 AM   Physician:  [name], MD 10/22/2015 8:42 AM   Nursing:   Grayland Ormond, RN 10/22/2015 8:42 AM   CSW:    [name], LCSW   10/22/2015 8:42 AM   Other:  10/22/2015 8:42 AM   Other:   10/22/2015 8:42 AM   Other:  Lars Pinks, Nurse CM 10/22/2015 8:42 AM  Other:   10/22/2015 8:42 AM   Other:  Norberto Sorenson, [place]  10/22/2015 8:42 AM   Other:  10/22/2015 8:42 AM   Other:  10/22/2015 8:42 AM   Other:  10/22/2015 8:42 AM   Other:  10/22/2015 8:42 AM   Other:  10/22/2015 8:42 AM   Other:   10/22/2015 8:42 AM    Scribe for Treatment Team:   [name], 10/22/2015 8:42 AM

## 2015-10-22 NOTE — Progress Notes (Signed)
DAR NOTE: Pt present with flat affect and depressed mood in the unit. Pt has been in the bed most of the morning. Pt complained of both knee pain, wheelchair ordered for safety purposes, took all her meds as scheduled. As per self inventory, pt had a good night sleep, fair appetite, low energy, and poor concentration. Pt rate depression at 9, hopeless ness at 8, and anxiety at 8. Pt's safety ensured with 15 minute and environmental checks. Pt currently denies SI/HI and A/V hallucinations. Pt verbally agrees to seek staff if SI/HI or A/VH occurs and to consult with staff before acting on these thoughts. Will continue POC.

## 2015-10-22 NOTE — Progress Notes (Signed)
Adult Psychoeducational Group Note  Date:  10/22/2015 Time:  9:55 PM  Group Topic/Focus:  Wrap-Up Group:   The focus of this group is to help patients review their daily goal of treatment and discuss progress on daily workbooks.  Participation Level:  Minimal  Participation Quality:  Appropriate  Affect:  Appropriate  Cognitive:  Alert  Insight: Limited  Engagement in Group:  Limited  Modes of Intervention:  Discussion  Additional Comments:  Patient was very limited in group tonight. Patient states, "my day was bad because I've been depressed all day".   Latoia Eyster L Javonda Suh 10/22/2015, 9:55 PM

## 2015-10-22 NOTE — Progress Notes (Signed)
[facility] MD Progress Note  10/22/2015 2:51 PM Maureen Swanson  MRN:  [id number] Subjective:  Patient reports " I had an argument with my boyfriend , I want to go to a recovery place this time.'  Objective: Maureen Swanson is a  50 y.o. AA female , who has a hx of schizoaffective do , cocaine, alcohol, cannabis abuse, who voluntarily presents to [facility] with c/o SI w/ a plan and command AH. Maureen Swanson is well known to the [facility] unit . Maureen Swanson reports that she continues to be depressed, has AH - saying negative things and she is paranoid and suicidal with plan to cut self. Pt reports she has been noncompliant with her medications, started abusing cocaine as well as alcohol , cannabis again. Pt reports abusing cocaine - 300 $ worth in a week and alcohol - 4 /40 oz daily. Pt reports withdrawal sx like agitation, restlessness , some tremors. Pt per staff - continues to need a lot of redirection on the unit.           Principal Problem: Schizoaffective disorder, depressive type ([place]) Diagnosis:   Patient Active Problem List   Diagnosis Date Noted  . Schizoaffective disorder, depressive type ([place]) [F25.1] 10/19/2015  . Homicidal ideation [R45.850]   . Tobacco use disorder [F17.200] 07/20/2015  . Diabetes mellitus ([place]) [E11.9] 07/20/2015  . Suicidal ideation [R45.851]   . Cocaine use disorder, moderate, dependence ([place]) [F14.20] 04/03/2015  . Schizoaffective disorder, bipolar type ([place]) [F25.0] 04/03/2015  . Alcohol use disorder, moderate, dependence ([place]) [F10.20] 04/03/2015  . Acute ischemic stroke ([place]) [I63.9] 12/08/2014  . Left-sided weakness [M62.89] 12/08/2014  . 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 Knee pain [M25.569] 10/17/2010  . MAMMOGRAM, ABNORMAL, RIGHT [R92.8] 08/05/2010  . Dyslipidemia [E78.5] 06/21/2010  . AMENORRHEA, SECONDARY [N91.2] 10/29/2009  . 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 GERD [K21.9] 10/11/2007  . Essential hypertension [I10] 02/26/2007   Total Time  spent with patient: 30 minutes  Past Psychiatric History: as per H&P.  Past Medical History:  Past Medical History  Diagnosis Date  . High cholesterol   . Depression   . Gout   . Anxiety   . Bipolar 1 disorder ([place])   . GERD (gastroesophageal reflux disease)   . Substance abuse     crack cocaine  . Headache(784.0)     migraines  . TMJ (temporomandibular joint disorder)   . Asthma     daily and prn inhalers  . Arthritis     back and knees  . Gastric ulcer   . 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 Hypertension     under control, has been on med. x 12 yrs.  . Diabetes mellitus     diet-controlled  . CHF (congestive heart failure) [facility])     Past Surgical History  Procedure Laterality Date  . Knee arthroscopy w/ partial medial meniscectomy  05/01/2010    right  . Breast lumpectomy with needle localization  04/19/2012    Procedure: BREAST LUMPECTOMY WITH NEEDLE LOCALIZATION;  Surgeon: [name], MD;  Location: [place];  Service: General;  Laterality: Right;   Family History:  Family History  Problem Relation Age of Onset  . Diabetes Mother   . Breast cancer Mother   . Cancer Father   . Bipolar disorder Maternal Aunt   . Schizophrenia Maternal Grandfather   . Alcoholism Maternal Uncle    Family Psychiatric  History: See  Above Social History: As per H&P. History  Alcohol Use  . 0.0 oz/week    Comment: reports she drinks 3 40 ounce beers daily     History  Drug Use  . Yes  . Special: Cocaine    Comment: 09/28/2015    Social History   Social History  . Marital Status: Single    Spouse Name: N/A  . Number of Children: N/A  . Years of Education: N/A   Social History Main Topics  . Smoking status: Current Every Day Smoker -- 0.50 packs/day for 15 years    Types: Cigarettes    Last Attempt to Quit: 08/02/2012  . Smokeless tobacco: Never Used  . Alcohol Use: 0.0 oz/week     Comment: reports she drinks 3 40 ounce  beers daily  . Drug Use: Yes    Special: Cocaine     Comment: 09/28/2015  . Sexual Activity: No   Other Topics Concern  . None   Social History Narrative   Additional Social History:                         Sleep: Fair  Appetite:  Fair  Current Medications: Current Facility-Administered Medications  Medication Dose Route Frequency Provider Last Rate Last Dose  . acetaminophen (TYLENOL) tablet 650 mg  650 mg Oral Q6H PRN Patrecia Pour, NP   650 mg at 10/21/15 1039  . albuterol (PROVENTIL HFA;VENTOLIN HFA) 108 (90 Base) MCG/ACT inhaler 2 puff  2 puff Inhalation Q6H PRN Patrecia Pour, NP      . alum & mag hydroxide-simeth (MAALOX/MYLANTA) 200-200-20 MG/5ML suspension 30 mL  30 mL Oral Q4H PRN Patrecia Pour, NP      . amLODipine (NORVASC) tablet 5 mg  5 mg Oral Daily Patrecia Pour, NP   5 mg at 10/22/15 0825  . [START ON 10/23/2015] ARIPiprazole (ABILIFY) tablet 5 mg  5 mg Oral Daily Tyrhonda Georgiades, MD      . ARIPiprazole SUSR 400 mg  400 mg Intramuscular Q28 days [name], MD   400 mg at 10/22/15 1152  . aspirin chewable tablet 81 mg  81 mg Oral Daily Patrecia Pour, NP   81 mg at 10/22/15 [id number]  . atenolol (TENORMIN) tablet 100 mg  100 mg Oral Daily Patrecia Pour, NP   100 mg at 10/22/15 0825  . atorvastatin (LIPITOR) tablet 40 mg  40 mg Oral QPM Patrecia Pour, NP   40 mg at 10/21/15 1716  . baclofen (LIORESAL) tablet 10 mg  10 mg Oral TID Patrecia Pour, NP   10 mg at 10/22/15 1153  . calcium carbonate (OS-CAL - dosed in mg of elemental calcium) tablet 500 mg of elemental calcium  1 tablet Oral Q breakfast Patrecia Pour, NP   500 mg of elemental calcium at 10/22/15 0825  . celecoxib (CELEBREX) capsule 100 mg  100 mg Oral BID Patrecia Pour, NP   100 mg at 10/22/15 0825  . furosemide (LASIX) tablet 40 mg  40 mg Oral Daily Patrecia Pour, NP   40 mg at 10/22/15 0824  . gabapentin (NEURONTIN) capsule 400 mg  400 mg Oral BID Patrecia Pour, NP   400 mg at 10/22/15 0824   . hydrOXYzine (ATARAX/VISTARIL) tablet 25 mg  25 mg Oral Q6H PRN Nicholaus Bloom, MD   25 mg at 10/22/15 0835  . indomethacin (INDOCIN) capsule 25 mg  25 mg  Oral TID PRN Patrecia Pour, NP   25 mg at 10/22/15 1053  . insulin glargine (LANTUS) injection 30 Units  30 Units Subcutaneous QHS Kerrie Buffalo, NP   30 Units at 10/21/15 2102  . linagliptin (TRADJENTA) tablet 5 mg  5 mg Oral Daily Patrecia Pour, NP   5 mg at 10/22/15 0824  . loperamide (IMODIUM) capsule 2-4 mg  2-4 mg Oral PRN Nicholaus Bloom, MD      . loratadine (CLARITIN) tablet 10 mg  10 mg Oral Daily Patrecia Pour, NP   10 mg at 10/22/15 0825  . LORazepam (ATIVAN) tablet 1 mg  1 mg Oral Q6H PRN Nicholaus Bloom, MD      . LORazepam (ATIVAN) tablet 1 mg  1 mg Oral BID Nicholaus Bloom, MD       Followed by  . [START ON 10/24/2015] LORazepam (ATIVAN) tablet 1 mg  1 mg Oral Daily Nicholaus Bloom, MD      . losartan (COZAAR) tablet 25 mg  25 mg Oral BID Patrecia Pour, NP   25 mg at 10/22/15 0826  . magnesium hydroxide (MILK OF MAGNESIA) suspension 30 mL  30 mL Oral Daily PRN Patrecia Pour, NP      . metFORMIN (GLUCOPHAGE) tablet 1,000 mg  1,000 mg Oral BID WC Patrecia Pour, NP   1,000 mg at 10/22/15 0824  . multivitamin with minerals tablet 1 tablet  1 tablet Oral Daily Nicholaus Bloom, MD   1 tablet at 10/22/15 0825  . nicotine (NICODERM CQ - dosed in mg/24 hours) patch 21 mg  21 mg Transdermal Daily Derrill Center, NP   21 mg at 10/22/15 0827  . ondansetron (ZOFRAN-ODT) disintegrating tablet 4 mg  4 mg Oral Q6H PRN Nicholaus Bloom, MD      . Oxcarbazepine (TRILEPTAL) tablet 300 mg  300 mg Oral BID Patrecia Pour, NP   300 mg at 10/22/15 0827  . QUEtiapine (SEROQUEL) tablet 400 mg  400 mg Oral QHS Jorge Amparo, MD      . thiamine (VITAMIN B-1) tablet 100 mg  100 mg Oral Daily Nicholaus Bloom, MD   100 mg at 10/22/15 0824  . traZODone (DESYREL) tablet 200 mg  200 mg Oral QHS Derrill Center, NP   200 mg at 10/21/15 2102    Lab Results:  Results  for orders placed or performed during the hospital encounter of 10/19/15 (from the past 48 hour(s))  Glucose, capillary     Status: Abnormal   Collection Time: 10/20/15  4:45 PM  Result Value Ref Range   Glucose-Capillary 127 (H) 65 - 99 mg/dL  Glucose, capillary     Status: Abnormal   Collection Time: 10/20/15  8:34 PM  Result Value Ref Range   Glucose-Capillary 120 (H) 65 - 99 mg/dL  Glucose, capillary     Status: Abnormal   Collection Time: 10/21/15  5:54 AM  Result Value Ref Range   Glucose-Capillary 114 (H) 65 - 99 mg/dL  Glucose, capillary     Status: None   Collection Time: 10/21/15 12:14 PM  Result Value Ref Range   Glucose-Capillary 86 65 - 99 mg/dL  Glucose, capillary     Status: Abnormal   Collection Time: 10/21/15  5:11 PM  Result Value Ref Range   Glucose-Capillary 127 (H) 65 - 99 mg/dL   Comment 1 Notify RN    Comment 2 Document in Chart   Glucose,  capillary     Status: Abnormal   Collection Time: 10/21/15  8:45 PM  Result Value Ref Range   Glucose-Capillary 153 (H) 65 - 99 mg/dL  Glucose, capillary     Status: Abnormal   Collection Time: 10/22/15  6:22 AM  Result Value Ref Range   Glucose-Capillary 108 (H) 65 - 99 mg/dL  Glucose, capillary     Status: Abnormal   Collection Time: 10/22/15 11:50 AM  Result Value Ref Range   Glucose-Capillary 110 (H) 65 - 99 mg/dL   Comment 1 Notify RN    Comment 2 Document in Chart     Blood Alcohol level:  Lab Results  Component Value Date   ETH <5 10/18/2015   ETH <5 08/08/2015    Physical Findings: AIMS: Facial and Oral Movements Muscles of Facial Expression: None, normal Lips and Perioral Area: None, normal Jaw: None, normal Tongue: None, normal,Extremity Movements Upper (arms, wrists, hands, fingers): None, normal Lower (legs, knees, ankles, toes): None, normal, Trunk Movements Neck, shoulders, hips: None, normal, Overall Severity Severity of abnormal movements (highest score from questions above): None,  normal Incapacitation due to abnormal movements: None, normal Patient's awareness of abnormal movements (rate only patient's report): No Awareness, Dental Status Current problems with teeth and/or dentures?: No Does patient usually wear dentures?: No  CIWA:  CIWA-Ar Total: 0 COWS:     Musculoskeletal: Strength & Muscle Tone: within normal limits Gait & Station: normal Patient leans: N/A  Psychiatric Specialty Exam: Review of Systems  Psychiatric/Behavioral: Positive for depression, suicidal ideas, hallucinations and substance abuse. The patient is nervous/anxious.   All other systems reviewed and are negative.   Blood pressure 151/97, pulse 79, temperature 99.1 F (37.3 C), temperature source Oral, resp. rate 20, height 5' 3.78" (1.62 m), weight 96.163 kg (212 lb), SpO2 98 %.Body mass index is 36.64 kg/(m^2).  General Appearance: Casual  Eye Contact::  Fair  Speech:  Clear and Coherent  Volume:  Normal  Mood:  Anxious and Depressed  Affect:  Congruent  Thought Process:  Coherent  Orientation:  Full (Time, Place, and Person)  Thought Content:  Delusions, Hallucinations: Auditory, Paranoid Ideation and Rumination  Suicidal Thoughts:  Yes.  with intent/plan  Homicidal Thoughts:  No  Memory:  Immediate;   Fair Recent;   Fair Remote;   Fair  Judgement:  Fair  Insight:  Present  Psychomotor Activity:  Restlessness  Concentration:  Fair  Recall:  [employer] of Knowledge:Fair  Language: Fair  Akathisia:  No  Handed:  Right  AIMS (if indicated):     Assets:  Communication Skills Desire for Improvement Resilience  ADL's:  Intact  Cognition: WNL  Sleep:  Number of Hours: 6.25     Treatment Plan Summary:Maureen Swanson is a  50 y.o.AA female , who has a hx of schizoaffective do , cocaine, alcohol, cannabis abuse, who voluntarily presents to [facility] with c/o SI w/ a plan and command AH. Pt continues to be depressed and has SI. Will continue treatment.  Daily contact with patient to  assess and evaluate symptoms and progress in treatment and Medication management   Continue CIWA/Ativan protocol for alcohol withdrawal sx. Continue Trileptal 300 mg BID for mood stabilization, Continue Vistaril 25 mg every six hours PRN anxiety,  continue Gabapentin 400 mg BID for anxiety/withdrawal Continue  Abilify 5 mg daily for psychosis. Will provide Abilify Maintena IM 400 mg x 1 dose today 10/22/15- repeat q28 days. Will restart seroquel 400 mg po qhs  for psychosis/augmenting the effect of Abilify . Will continue Trazodone 200 mg PO QHS for insomnia. Restart home medications where indicated. -Individual and substance abuse counseling CSW will continue to work on disposition - pt to be referred to a substance abuse program - she is very motivated to do so.   Kathia Covington, MD 10/22/2015, 2:51 PM

## 2015-10-23 LAB — GLUCOSE, CAPILLARY
GLUCOSE-CAPILLARY: 110 mg/dL — AB (ref 65–99)
Glucose-Capillary: 102 mg/dL — ABNORMAL HIGH (ref 65–99)
Glucose-Capillary: 151 mg/dL — ABNORMAL HIGH (ref 65–99)
Glucose-Capillary: 212 mg/dL — ABNORMAL HIGH (ref 65–99)

## 2015-10-23 MED ORDER — QUETIAPINE FUMARATE 100 MG PO TABS
450.0000 mg | ORAL_TABLET | Freq: Every day | ORAL | Status: DC
  Administered 2015-10-23: 450 mg via ORAL
  Filled 2015-10-23 (×2): qty 4.5

## 2015-10-23 NOTE — Progress Notes (Signed)
D: Pt was in the hallway upon initial approach.  Pt has depressed affect and mood.  She reports her day was "not good."  Pt reports her goal tonight is to "have a good nights sleep."  Pt reports she has had thoughts of self-harm with a plan to cut.  She verbally contracts for safety.  Pt denies HI, denies hallucinations, reports bilateral ankle pain of 9/10.  Pt has been visible in the milieu and she attended evening group. A: Introduced self to pt.  Met with pt and provided support and encouragement.  Actively listened to pt.  Medications administered per order.  PRN medication administered for pain. R: Pt is compliant with medications.  Pt verbally contracts for safety.  Will continue to monitor and assess.

## 2015-10-23 NOTE — Plan of Care (Signed)
Problem: Ineffective individual coping Goal: STG: Patient will remain free from self harm Outcome: Progressing Pt endorsed self injurious thoughts, verbally contracts for safety. Pt remains safe on Q 15 minutes checks without self injurious behavior to note at this time.   Problem: Diagnosis: Increased Risk For Suicide Attempt Goal: LTG-Patient Will Report Absence of Withdrawal Symptoms LTG (by discharge): Patient will report absence of withdrawal symptoms.  Outcome: Progressing Pt denies withdrawal symptoms when assessed. Ativan protocol maintained.

## 2015-10-23 NOTE — Progress Notes (Signed)
[facility] MD Progress Note  10/23/2015 2:10 PM Maureen Swanson  MRN:  [id number] Subjective:  Patient reports " I feel so so . I did not sleep all that well last night.'   Objective: Maureen Swanson is a  50 y.o. AA female , who has a hx of schizoaffective do , cocaine, alcohol, cannabis abuse, who voluntarily presents to [facility] with c/o SI w/ a plan and command AH. Maureen Swanson is well known to the [facility] unit .  Maureen Swanson reports that she continues to be depressed, anxious , continues to have AH. Pt also reports sleep issues last night. Maureen Swanson wants to get help with her substance abuse and is motivated to go to a substance abuse treatment program.She has already called a few places and left messages. Pt per staff - continues to need a lot of redirection on the unit.           Principal Problem: Schizoaffective disorder, depressive type ([place]) Diagnosis:   Patient Active Problem List   Diagnosis Date Noted  . Schizoaffective disorder, depressive type ([place]) [F25.1] 10/19/2015  . Homicidal ideation [R45.850]   . Tobacco use disorder [F17.200] 07/20/2015  . Diabetes mellitus ([place]) [E11.9] 07/20/2015  . Suicidal ideation [R45.851]   . Cocaine use disorder, moderate, dependence ([place]) [F14.20] 04/03/2015  . Schizoaffective disorder, bipolar type ([place]) [F25.0] 04/03/2015  . Alcohol use disorder, moderate, dependence ([place]) [F10.20] 04/03/2015  . Acute ischemic stroke ([place]) [I63.9] 12/08/2014  . Left-sided weakness [M62.89] 12/08/2014  . 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 Knee pain [M25.569] 10/17/2010  . MAMMOGRAM, ABNORMAL, RIGHT [R92.8] 08/05/2010  . Dyslipidemia [E78.5] 06/21/2010  . AMENORRHEA, SECONDARY [N91.2] 10/29/2009  . 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 GERD [K21.9] 10/11/2007  . Essential hypertension [I10] 02/26/2007   Total Time spent with patient: 25 minutes  Past Psychiatric History: as per H&P.  Past Medical History:  Past Medical History  Diagnosis Date  . High  cholesterol   . Depression   . Gout   . Anxiety   . Bipolar 1 disorder ([place])   . GERD (gastroesophageal reflux disease)   . Substance abuse     crack cocaine  . Headache(784.0)     migraines  . TMJ (temporomandibular joint disorder)   . Asthma     daily and prn inhalers  . Arthritis     back and knees  . Gastric ulcer   . 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 Hypertension     under control, has been on med. x 12 yrs.  . Diabetes mellitus     diet-controlled  . CHF (congestive heart failure) [facility])     Past Surgical History  Procedure Laterality Date  . Knee arthroscopy w/ partial medial meniscectomy  05/01/2010    right  . Breast lumpectomy with needle localization  04/19/2012    Procedure: BREAST LUMPECTOMY WITH NEEDLE LOCALIZATION;  Surgeon: [name], MD;  Location: [place];  Service: General;  Laterality: Right;   Family History:  Family History  Problem Relation Age of Onset  . Diabetes Mother   . Breast cancer Mother   . Cancer Father   . Bipolar disorder Maternal Aunt   . Schizophrenia Maternal Grandfather   . Alcoholism Maternal Uncle    Family Psychiatric  History: See Above Social History: As per H&P. History  Alcohol Use  . 0.0 oz/week    Comment: reports she drinks 3 40 ounce  beers daily     History  Drug Use  . Yes  . Special: Cocaine    Comment: 09/28/2015    Social History   Social History  . Marital Status: Single    Spouse Name: N/A  . Number of Children: N/A  . Years of Education: N/A   Social History Main Topics  . Smoking status: Current Every Day Smoker -- 0.50 packs/day for 15 years    Types: Cigarettes    Last Attempt to Quit: 08/02/2012  . Smokeless tobacco: Never Used  . Alcohol Use: 0.0 oz/week     Comment: reports she drinks 3 40 ounce beers daily  . Drug Use: Yes    Special: Cocaine     Comment: 09/28/2015  . Sexual Activity: No   Other Topics Concern  . None   Social  History Narrative   Additional Social History:                         Sleep: Poor  Appetite:  Fair  Current Medications: Current Facility-Administered Medications  Medication Dose Route Frequency Provider Last Rate Last Dose  . acetaminophen (TYLENOL) tablet 650 mg  650 mg Oral Q6H PRN Patrecia Pour, NP   650 mg at 10/22/15 2104  . albuterol (PROVENTIL HFA;VENTOLIN HFA) 108 (90 Base) MCG/ACT inhaler 2 puff  2 puff Inhalation Q6H PRN Patrecia Pour, NP      . alum & mag hydroxide-simeth (MAALOX/MYLANTA) 200-200-20 MG/5ML suspension 30 mL  30 mL Oral Q4H PRN Patrecia Pour, NP      . amLODipine (NORVASC) tablet 5 mg  5 mg Oral Daily Patrecia Pour, NP   5 mg at 10/23/15 0847  . ARIPiprazole (ABILIFY) tablet 5 mg  5 mg Oral Daily [name], MD   5 mg at 10/23/15 0847  . ARIPiprazole SUSR 400 mg  400 mg Intramuscular Q28 days [name], MD   400 mg at 10/22/15 1152  . aspirin chewable tablet 81 mg  81 mg Oral Daily Patrecia Pour, NP   81 mg at 10/23/15 0848  . atenolol (TENORMIN) tablet 100 mg  100 mg Oral Daily Patrecia Pour, NP   100 mg at 10/23/15 0848  . atorvastatin (LIPITOR) tablet 40 mg  40 mg Oral QPM Patrecia Pour, NP   40 mg at 10/22/15 1836  . baclofen (LIORESAL) tablet 10 mg  10 mg Oral TID Patrecia Pour, NP   10 mg at 10/23/15 1206  . calcium carbonate (OS-CAL - dosed in mg of elemental calcium) tablet 500 mg of elemental calcium  1 tablet Oral Q breakfast Patrecia Pour, NP   500 mg of elemental calcium at 10/23/15 0848  . celecoxib (CELEBREX) capsule 100 mg  100 mg Oral BID Patrecia Pour, NP   100 mg at 10/23/15 0847  . furosemide (LASIX) tablet 40 mg  40 mg Oral Daily Patrecia Pour, NP   40 mg at 10/23/15 0848  . gabapentin (NEURONTIN) capsule 400 mg  400 mg Oral BID Patrecia Pour, NP   400 mg at 10/23/15 0848  . indomethacin (INDOCIN) capsule 25 mg  25 mg Oral TID PRN Patrecia Pour, NP   25 mg at 10/22/15 1838  . insulin glargine (LANTUS)  injection 30 Units  30 Units Subcutaneous QHS Kerrie Buffalo, NP   30 Units at 10/22/15 2105  . linagliptin (TRADJENTA) tablet 5 mg  5 mg Oral  Daily Patrecia Pour, NP   5 mg at 10/23/15 [id number]  . loratadine (CLARITIN) tablet 10 mg  10 mg Oral Daily Patrecia Pour, NP   10 mg at 10/23/15 0853  . [START ON 10/24/2015] LORazepam (ATIVAN) tablet 1 mg  1 mg Oral Daily Nicholaus Bloom, MD      . losartan (COZAAR) tablet 25 mg  25 mg Oral BID Patrecia Pour, NP   25 mg at 10/23/15 0848  . magnesium hydroxide (MILK OF MAGNESIA) suspension 30 mL  30 mL Oral Daily PRN Patrecia Pour, NP      . metFORMIN (GLUCOPHAGE) tablet 1,000 mg  1,000 mg Oral BID WC Patrecia Pour, NP   1,000 mg at 10/23/15 0852  . multivitamin with minerals tablet 1 tablet  1 tablet Oral Daily Nicholaus Bloom, MD   1 tablet at 10/23/15 0848  . nicotine (NICODERM CQ - dosed in mg/24 hours) patch 21 mg  21 mg Transdermal Daily Derrill Center, NP   21 mg at 10/23/15 0853  . Oxcarbazepine (TRILEPTAL) tablet 300 mg  300 mg Oral BID Patrecia Pour, NP   300 mg at 10/23/15 0847  . QUEtiapine (SEROQUEL) tablet 450 mg  450 mg Oral QHS Jaremy Nosal, MD      . thiamine (VITAMIN B-1) tablet 100 mg  100 mg Oral Daily Nicholaus Bloom, MD   100 mg at 10/23/15 0848  . traZODone (DESYREL) tablet 200 mg  200 mg Oral QHS Derrill Center, NP   200 mg at 10/22/15 2105    Lab Results:  Results for orders placed or performed during the hospital encounter of 10/19/15 (from the past 48 hour(s))  Glucose, capillary     Status: Abnormal   Collection Time: 10/21/15  5:11 PM  Result Value Ref Range   Glucose-Capillary 127 (H) 65 - 99 mg/dL   Comment 1 Notify RN    Comment 2 Document in Chart   Glucose, capillary     Status: Abnormal   Collection Time: 10/21/15  8:45 PM  Result Value Ref Range   Glucose-Capillary 153 (H) 65 - 99 mg/dL  Glucose, capillary     Status: Abnormal   Collection Time: 10/22/15  6:22 AM  Result Value Ref Range   Glucose-Capillary 108  (H) 65 - 99 mg/dL  Glucose, capillary     Status: Abnormal   Collection Time: 10/22/15 11:50 AM  Result Value Ref Range   Glucose-Capillary 110 (H) 65 - 99 mg/dL   Comment 1 Notify RN    Comment 2 Document in Chart   Glucose, capillary     Status: Abnormal   Collection Time: 10/22/15  4:55 PM  Result Value Ref Range   Glucose-Capillary 159 (H) 65 - 99 mg/dL   Comment 1 Notify RN    Comment 2 Document in Chart   Glucose, capillary     Status: Abnormal   Collection Time: 10/22/15  8:46 PM  Result Value Ref Range   Glucose-Capillary 194 (H) 65 - 99 mg/dL  Glucose, capillary     Status: Abnormal   Collection Time: 10/23/15  6:06 AM  Result Value Ref Range   Glucose-Capillary 102 (H) 65 - 99 mg/dL  Glucose, capillary     Status: Abnormal   Collection Time: 10/23/15 11:45 AM  Result Value Ref Range   Glucose-Capillary 151 (H) 65 - 99 mg/dL   Comment 1 Notify RN    Comment 2 Document in Chart  Blood Alcohol level:  Lab Results  Component Value Date   ETH <5 10/18/2015   ETH <5 08/08/2015    Physical Findings: AIMS: Facial and Oral Movements Muscles of Facial Expression: None, normal Lips and Perioral Area: None, normal Jaw: None, normal Tongue: None, normal,Extremity Movements Upper (arms, wrists, hands, fingers): None, normal Lower (legs, knees, ankles, toes): None, normal, Trunk Movements Neck, shoulders, hips: None, normal, Overall Severity Severity of abnormal movements (highest score from questions above): None, normal Incapacitation due to abnormal movements: None, normal Patient's awareness of abnormal movements (rate only patient's report): No Awareness, Dental Status Current problems with teeth and/or dentures?: No Does patient usually wear dentures?: No  CIWA:  CIWA-Ar Total: 1 COWS:     Musculoskeletal: Strength & Muscle Tone: within normal limits Gait & Station: normal Patient leans: N/A  Psychiatric Specialty Exam: Review of Systems   Psychiatric/Behavioral: Positive for depression, suicidal ideas, hallucinations and substance abuse. The patient is nervous/anxious and has insomnia.   All other systems reviewed and are negative.   Blood pressure 130/84, pulse 81, temperature 99 F (37.2 C), temperature source Oral, resp. rate 18, height 5' 3.78" (1.62 m), weight 96.163 kg (212 lb), SpO2 98 %.Body mass index is 36.64 kg/(m^2).  General Appearance: Casual  Eye Contact::  Fair  Speech:  Clear and Coherent  Volume:  Normal  Mood:  Anxious and Depressed  Affect:  Congruent  Thought Process:  Coherent  Orientation:  Full (Time, Place, and Person)  Thought Content:  Delusions, Hallucinations: Auditory, Paranoid Ideation and Rumination  Suicidal Thoughts:  Yes.  with intent/plan shoot self  Homicidal Thoughts:  No  Memory:  Immediate;   Fair Recent;   Fair Remote;   Fair  Judgement:  Fair  Insight:  Present  Psychomotor Activity:  Restlessness  Concentration:  Fair  Recall:  [employer] of Knowledge:Fair  Language: Fair  Akathisia:  No  Handed:  Right  AIMS (if indicated):     Assets:  Communication Skills Desire for Improvement Resilience  ADL's:  Intact  Cognition: WNL  Sleep:  Number of Hours: 6.75     Treatment Plan Summary:Madinah Jerilynn Mages Portley is a  50 y.o.AA female , who has a hx of schizoaffective do , cocaine, alcohol, cannabis abuse, who voluntarily presents to [facility] with c/o SI w/ a plan and command AH. Pt continues to be depressed and has SI. Will continue treatment.  Daily contact with patient to assess and evaluate symptoms and progress in treatment and Medication management   Continue CIWA/Ativan protocol for alcohol withdrawal sx. Continue Trileptal 300 mg BID for mood stabilization, Continue Vistaril 25 mg every six hours PRN anxiety,  continue Gabapentin 400 mg BID for anxiety/withdrawal Continue  Abilify 5 mg daily for psychosis.  Abilify Maintena IM 400 mg x 1 dose  10/22/15- repeat q28 days. Will  increase seroquel to 450 mg po qhs for psychosis/augmenting the effect of Abilify . Will continue Trazodone 200 mg PO QHS for insomnia. Restarted home medications where indicated. -Individual and substance abuse counseling CSW will continue to work on disposition - pt to be referred to a substance abuse program - she is very motivated to do so.   Riti Rollyson, MD 10/23/2015, 2:10 PM

## 2015-10-23 NOTE — Progress Notes (Signed)
D: Pt visible in milieu and hall at intervals during shift. Presents with flat affect and depressed mood. Endorsed thoughts of hurting self this AM when assessed "yeah, I have thoughts of hurting myself, but not anyone else". Rated her depression, anxiety and hopelessness all 8/10. Pt reported sleeping good with fair appetite, low energy and poor concentration level. A: Scheduled medications administered as prescribed. Writer encouraged pt to voice concerns. Safety needs discussed with pt related to self harm thoughts. Emotional support and availability provided to pt. Q 15 minutes checks maintained for safety without gestures or event of self harm behavior to report at this time. R: Pt receptive to care. Denies HI, AVH and pain when assessed.Verbally contracted for safety. Compliant with medications when offered. Pt attended noon groups. Denies concerns at present. Safety maintained on and off unit.

## 2015-10-23 NOTE — Progress Notes (Signed)
Adult Psychoeducational Group Note  Date:  10/23/2015 Time:  9:32 PM  Group Topic/Focus:  Wrap-Up Group:   The focus of this group is to help patients review their daily goal of treatment and discuss progress on daily workbooks.  Participation Level:  Active  Participation Quality:  Appropriate  Affect:  Appropriate  Cognitive:  Alert  Insight: Appropriate  Engagement in Group:  Engaged  Modes of Intervention:  Discussion  Additional Comments:  Patient goal for today was "to get more rest".  On a scale between 1-10, (1=worse, 10=best) patient rated her day a 5.  Laqueta Bonaventura L Chadley Dziedzic 10/23/2015, 9:32 PM

## 2015-10-23 NOTE — BHH Group Notes (Signed)
[facility] Group Notes:  (Nursing/MHT/Case Management/Adjunct)  Date:  10/23/2015  Time:  2:27 PM  Type of Therapy:  Nurse Education  Participation Level:  Did Not Attend    Mart Piggs 10/23/2015, 2:27 PM

## 2015-10-23 NOTE — BHH Group Notes (Signed)
[facility] LCSW Group Therapy  10/23/2015 , 1:26 PM   Type of Therapy:  Group Therapy  Participation Level:  Active  Participation Quality:  Attentive  Affect:  Appropriate  Cognitive:  Alert  Insight:  Improving  Engagement in Therapy:  Engaged  Modes of Intervention:  Discussion, Exploration and Socialization  Summary of Progress/Problems: Today's group focused on the term Diagnosis.  Participants were asked to define the term, and then pronounce whether it is a negative, positive or neutral term. In and out of group multiple times.  No meaningful contribution.  Shared with others that she had left messages at a couple fo treatment centers, and was waiting to hear back.  Admitted to having difficulty focusing.  [name] 10/23/2015 , 1:26 PM

## 2015-10-24 LAB — GLUCOSE, CAPILLARY
GLUCOSE-CAPILLARY: 108 mg/dL — AB (ref 65–99)
Glucose-Capillary: 130 mg/dL — ABNORMAL HIGH (ref 65–99)
Glucose-Capillary: 158 mg/dL — ABNORMAL HIGH (ref 65–99)
Glucose-Capillary: 204 mg/dL — ABNORMAL HIGH (ref 65–99)

## 2015-10-24 MED ORDER — QUETIAPINE FUMARATE 200 MG PO TABS
500.0000 mg | ORAL_TABLET | Freq: Every day | ORAL | Status: DC
  Administered 2015-10-25 – 2015-10-28 (×4): 500 mg via ORAL
  Filled 2015-10-24 (×2): qty 2.5
  Filled 2015-10-24: qty 18
  Filled 2015-10-24 (×4): qty 2.5

## 2015-10-24 MED ORDER — OXCARBAZEPINE 300 MG PO TABS
600.0000 mg | ORAL_TABLET | Freq: Every evening | ORAL | Status: DC
  Administered 2015-10-24 – 2015-10-28 (×5): 600 mg via ORAL
  Filled 2015-10-24 (×7): qty 2

## 2015-10-24 MED ORDER — HYDROXYZINE HCL 25 MG PO TABS
25.0000 mg | ORAL_TABLET | Freq: Four times a day (QID) | ORAL | Status: DC | PRN
  Administered 2015-10-25: 25 mg via ORAL
  Filled 2015-10-24: qty 1
  Filled 2015-10-24: qty 10

## 2015-10-24 MED ORDER — OXCARBAZEPINE 300 MG PO TABS
300.0000 mg | ORAL_TABLET | Freq: Every day | ORAL | Status: DC
  Administered 2015-10-25 – 2015-10-29 (×5): 300 mg via ORAL
  Filled 2015-10-24 (×4): qty 1
  Filled 2015-10-24: qty 21
  Filled 2015-10-24 (×2): qty 1

## 2015-10-24 NOTE — Plan of Care (Signed)
Problem: Diagnosis: Increased Risk For Suicide Attempt Goal: STG-Patient Will Report Suicidal Feelings to Staff Outcome: Progressing Maureen Swanson reports SI to staff and is able to contract for safety.

## 2015-10-24 NOTE — BHH Group Notes (Signed)
[facility] [employer] Group Therapy  10/24/2015 , 12:09 PM    Type of Therapy:  Mental [employer] Presentation  Participation Level:  Active  Participation Quality:  Attentive  Affect:  Blunted  Cognitive:  Oriented  Insight:  Limited  Engagement in Therapy:  Engaged  Modes of Intervention:  Discussion, Education and Socialization  Summary of Progress/Problems:  [name] from [place] came to present his recovery story and play the guitar.  Invited.  In bed.  Chose to not attend.  [name] 10/24/2015 , 12:09 PM

## 2015-10-24 NOTE — Progress Notes (Signed)
[facility] MD Progress Note  10/24/2015 12:15 PM CHELA TURRELL  MRN:  [id number] Subjective:  Patient reports " I feel suicidal. I hear some voices . I had a restless night last night.'    Objective: KHALIDA SAX is a  50 y.o. AA female , who has a hx of schizoaffective do , cocaine, alcohol, cannabis abuse, who voluntarily presents to [facility] with c/o SI w/ a plan and command AH. Shiley is well known to the [facility] unit .  Georgeanna today continues to be depressed , seen in her wheel chair , continues to have SI on and off . Shriley continues to have sleep issues - discussed increasing her seroquel - she agrees with the same. That will also help with her AH , she continues to have it on and off. Sanne continues to want to get help with her substance abuse and is motivated to go to a substance abuse treatment program. Pt per staff - continues to be depressed , withdrawn and needs a lot of support.            Principal Problem: Schizoaffective disorder, depressive type ([place]) Diagnosis:   Patient Active Problem List   Diagnosis Date Noted  . Schizoaffective disorder, depressive type ([place]) [F25.1] 10/19/2015  . Homicidal ideation [R45.850]   . Tobacco use disorder [F17.200] 07/20/2015  . Diabetes mellitus ([place]) [E11.9] 07/20/2015  . Suicidal ideation [R45.851]   . Cocaine use disorder, moderate, dependence ([place]) [F14.20] 04/03/2015  . Schizoaffective disorder, bipolar type ([place]) [F25.0] 04/03/2015  . Alcohol use disorder, moderate, dependence ([place]) [F10.20] 04/03/2015  . Acute ischemic stroke ([place]) [I63.9] 12/08/2014  . Left-sided weakness [M62.89] 12/08/2014  . 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 Knee pain [M25.569] 10/17/2010  . MAMMOGRAM, ABNORMAL, RIGHT [R92.8] 08/05/2010  . Dyslipidemia [E78.5] 06/21/2010  . AMENORRHEA, SECONDARY [N91.2] 10/29/2009  . 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 GERD [K21.9] 10/11/2007  . Essential hypertension [I10] 02/26/2007   Total Time  spent with patient: 25 minutes  Past Psychiatric History: as per H&P.  Past Medical History:  Past Medical History  Diagnosis Date  . High cholesterol   . Depression   . Gout   . Anxiety   . Bipolar 1 disorder ([place])   . GERD (gastroesophageal reflux disease)   . Substance abuse     crack cocaine  . Headache(784.0)     migraines  . TMJ (temporomandibular joint disorder)   . Asthma     daily and prn inhalers  . Arthritis     back and knees  . Gastric ulcer   . 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 Hypertension     under control, has been on med. x 12 yrs.  . Diabetes mellitus     diet-controlled  . CHF (congestive heart failure) [facility])     Past Surgical History  Procedure Laterality Date  . Knee arthroscopy w/ partial medial meniscectomy  05/01/2010    right  . Breast lumpectomy with needle localization  04/19/2012    Procedure: BREAST LUMPECTOMY WITH NEEDLE LOCALIZATION;  Surgeon: [name], MD;  Location: [place];  Service: General;  Laterality: Right;   Family History:  Family History  Problem Relation Age of Onset  . Diabetes Mother   . Breast cancer Mother   . Cancer Father   . Bipolar disorder Maternal Aunt   . Schizophrenia Maternal Grandfather   . Alcoholism Maternal Uncle  Family Psychiatric  History: See Above Social History: As per H&P. History  Alcohol Use  . 0.0 oz/week    Comment: reports she drinks 3 40 ounce beers daily     History  Drug Use  . Yes  . Special: Cocaine    Comment: 09/28/2015    Social History   Social History  . Marital Status: Single    Spouse Name: N/A  . Number of Children: N/A  . Years of Education: N/A   Social History Main Topics  . Smoking status: Current Every Day Smoker -- 0.50 packs/day for 15 years    Types: Cigarettes    Last Attempt to Quit: 08/02/2012  . Smokeless tobacco: Never Used  . Alcohol Use: 0.0 oz/week     Comment: reports she drinks 3 40 ounce  beers daily  . Drug Use: Yes    Special: Cocaine     Comment: 09/28/2015  . Sexual Activity: No   Other Topics Concern  . None   Social History Narrative   Additional Social History:                         Sleep: Poor  Appetite:  Fair  Current Medications: Current Facility-Administered Medications  Medication Dose Route Frequency Provider Last Rate Last Dose  . acetaminophen (TYLENOL) tablet 650 mg  650 mg Oral Q6H PRN Patrecia Pour, NP   650 mg at 10/22/15 2104  . albuterol (PROVENTIL HFA;VENTOLIN HFA) 108 (90 Base) MCG/ACT inhaler 2 puff  2 puff Inhalation Q6H PRN Patrecia Pour, NP      . alum & mag hydroxide-simeth (MAALOX/MYLANTA) 200-200-20 MG/5ML suspension 30 mL  30 mL Oral Q4H PRN Patrecia Pour, NP      . amLODipine (NORVASC) tablet 5 mg  5 mg Oral Daily Patrecia Pour, NP   5 mg at 10/24/15 [id number]  . ARIPiprazole (ABILIFY) tablet 5 mg  5 mg Oral Daily [name], MD   5 mg at 10/24/15 [id number]  . ARIPiprazole SUSR 400 mg  400 mg Intramuscular Q28 days [name], MD   400 mg at 10/22/15 1152  . aspirin chewable tablet 81 mg  81 mg Oral Daily Patrecia Pour, NP   81 mg at 10/24/15 [id number]  . atenolol (TENORMIN) tablet 100 mg  100 mg Oral Daily Patrecia Pour, NP   100 mg at 10/24/15 [id number]  . atorvastatin (LIPITOR) tablet 40 mg  40 mg Oral QPM Patrecia Pour, NP   40 mg at 10/23/15 1701  . baclofen (LIORESAL) tablet 10 mg  10 mg Oral TID Patrecia Pour, NP   10 mg at 10/24/15 0919  . calcium carbonate (OS-CAL - dosed in mg of elemental calcium) tablet 500 mg of elemental calcium  1 tablet Oral Q breakfast Patrecia Pour, NP   500 mg of elemental calcium at 10/24/15 0918  . celecoxib (CELEBREX) capsule 100 mg  100 mg Oral BID Patrecia Pour, NP   100 mg at 10/24/15 [id number]  . furosemide (LASIX) tablet 40 mg  40 mg Oral Daily Patrecia Pour, NP   40 mg at 10/24/15 0917  . gabapentin (NEURONTIN) capsule 400 mg  400 mg Oral BID Patrecia Pour, NP   400 mg at 10/24/15 [id number]   . indomethacin (INDOCIN) capsule 25 mg  25 mg Oral TID PRN Patrecia Pour, NP   25 mg at 10/22/15 1838  . insulin glargine (LANTUS)  injection 30 Units  30 Units Subcutaneous QHS Kerrie Buffalo, NP   30 Units at 10/23/15 2156  . linagliptin (TRADJENTA) tablet 5 mg  5 mg Oral Daily Patrecia Pour, NP   5 mg at 10/24/15 0919  . loratadine (CLARITIN) tablet 10 mg  10 mg Oral Daily Patrecia Pour, NP   10 mg at 10/24/15 0919  . losartan (COZAAR) tablet 25 mg  25 mg Oral BID Patrecia Pour, NP   25 mg at 10/24/15 0919  . magnesium hydroxide (MILK OF MAGNESIA) suspension 30 mL  30 mL Oral Daily PRN Patrecia Pour, NP      . metFORMIN (GLUCOPHAGE) tablet 1,000 mg  1,000 mg Oral BID WC Patrecia Pour, NP   1,000 mg at 10/24/15 0919  . multivitamin with minerals tablet 1 tablet  1 tablet Oral Daily Nicholaus Bloom, MD   1 tablet at 10/24/15 0919  . nicotine (NICODERM CQ - dosed in mg/24 hours) patch 21 mg  21 mg Transdermal Daily Derrill Center, NP   21 mg at 10/24/15 0921  . [START ON 10/25/2015] Oxcarbazepine (TRILEPTAL) tablet 300 mg  300 mg Oral Daily Deundre Thong, MD      . Oxcarbazepine (TRILEPTAL) tablet 600 mg  600 mg Oral QPM Evella Kasal, MD      . QUEtiapine (SEROQUEL) tablet 500 mg  500 mg Oral QHS Amiylah Anastos, MD      . thiamine (VITAMIN B-1) tablet 100 mg  100 mg Oral Daily Nicholaus Bloom, MD   100 mg at 10/24/15 [id number]  . traZODone (DESYREL) tablet 200 mg  200 mg Oral QHS Derrill Center, NP   200 mg at 10/23/15 2153    Lab Results:  Results for orders placed or performed during the hospital encounter of 10/19/15 (from the past 48 hour(s))  Glucose, capillary     Status: Abnormal   Collection Time: 10/22/15  4:55 PM  Result Value Ref Range   Glucose-Capillary 159 (H) 65 - 99 mg/dL   Comment 1 Notify RN    Comment 2 Document in Chart   Glucose, capillary     Status: Abnormal   Collection Time: 10/22/15  8:46 PM  Result Value Ref Range   Glucose-Capillary 194 (H) 65 - 99 mg/dL   Glucose, capillary     Status: Abnormal   Collection Time: 10/23/15  6:06 AM  Result Value Ref Range   Glucose-Capillary 102 (H) 65 - 99 mg/dL  Glucose, capillary     Status: Abnormal   Collection Time: 10/23/15 11:45 AM  Result Value Ref Range   Glucose-Capillary 151 (H) 65 - 99 mg/dL   Comment 1 Notify RN    Comment 2 Document in Chart   Glucose, capillary     Status: Abnormal   Collection Time: 10/23/15  4:57 PM  Result Value Ref Range   Glucose-Capillary 212 (H) 65 - 99 mg/dL   Comment 1 Notify RN    Comment 2 Document in Chart   Glucose, capillary     Status: Abnormal   Collection Time: 10/23/15  8:28 PM  Result Value Ref Range   Glucose-Capillary 110 (H) 65 - 99 mg/dL  Glucose, capillary     Status: Abnormal   Collection Time: 10/24/15  6:17 AM  Result Value Ref Range   Glucose-Capillary 130 (H) 65 - 99 mg/dL  Glucose, capillary     Status: Abnormal   Collection Time: 10/24/15 11:36 AM  Result Value Ref  Range   Glucose-Capillary 158 (H) 65 - 99 mg/dL   Comment 1 Notify RN    Comment 2 Document in Chart     Blood Alcohol level:  Lab Results  Component Value Date   ETH <5 10/18/2015   ETH <5 08/08/2015    Physical Findings: AIMS: Facial and Oral Movements Muscles of Facial Expression: None, normal Lips and Perioral Area: None, normal Jaw: None, normal Tongue: None, normal,Extremity Movements Upper (arms, wrists, hands, fingers): None, normal Lower (legs, knees, ankles, toes): None, normal, Trunk Movements Neck, shoulders, hips: None, normal, Overall Severity Severity of abnormal movements (highest score from questions above): None, normal Incapacitation due to abnormal movements: None, normal Patient's awareness of abnormal movements (rate only patient's report): No Awareness, Dental Status Current problems with teeth and/or dentures?: No Does patient usually wear dentures?: No  CIWA:  CIWA-Ar Total: 7 COWS:     Musculoskeletal: Strength & Muscle Tone:  within normal limits Gait & Station: normal Patient leans: N/A  Psychiatric Specialty Exam: Review of Systems  Psychiatric/Behavioral: Positive for depression, suicidal ideas, hallucinations and substance abuse. The patient is nervous/anxious and has insomnia.   All other systems reviewed and are negative.   Blood pressure 132/87, pulse 86, temperature 98.6 F (37 C), temperature source Oral, resp. rate 18, height 5' 3.78" (1.62 m), weight 96.163 kg (212 lb), SpO2 98 %.Body mass index is 36.64 kg/(m^2).  General Appearance: Casual  Eye Contact::  Fair  Speech:  Clear and Coherent  Volume:  Normal  Mood:  Anxious and Depressed  Affect:  Congruent  Thought Process:  Coherent  Orientation:  Full (Time, Place, and Person)  Thought Content:  Delusions, Hallucinations: Auditory, Paranoid Ideation and Rumination  Suicidal Thoughts:  Yes.  with intent/plan shoot self  Homicidal Thoughts:  No  Memory:  Immediate;   Fair Recent;   Fair Remote;   Fair  Judgement:  Fair  Insight:  Present  Psychomotor Activity:  Restlessness  Concentration:  Fair  Recall:  [employer] of Knowledge:Fair  Language: Fair  Akathisia:  No  Handed:  Right  AIMS (if indicated):     Assets:  Communication Skills Desire for Improvement Resilience  ADL's:  Intact  Cognition: WNL  Sleep:  Number of Hours: 6.75     Treatment Plan Summary:Arda Jerilynn Mages Mathes is a  50 y.o.AA female , who has a hx of schizoaffective do , cocaine, alcohol, cannabis abuse, who voluntarily presents to [facility] with c/o SI w/ a plan and command AH. Pt continues to be depressed and has SI. Will continue treatment.  Daily contact with patient to assess and evaluate symptoms and progress in treatment and Medication management   Continue CIWA/Ativan protocol for alcohol withdrawal sx. Increase Trileptal to 300 mg po daily and 600 mg po qpm for mood stabilization, Continue Vistaril 25 mg every six hours PRN anxiety,  continue Gabapentin 400 mg  BID for anxiety/withdrawal Continue  Abilify 5 mg daily for psychosis.  Abilify Maintena IM 400 mg x 1 dose  10/22/15- repeat q28 days. Will increase seroquel to 500 mg po qhs for psychosis/augmenting the effect of Abilify . Will continue Trazodone 200 mg PO QHS for insomnia. Restarted home medications where indicated. -Individual and substance abuse counseling CSW will continue to work on disposition - pt to be referred to a substance abuse program - she is very motivated to do so.   Bryen Hinderman, MD 10/24/2015, 12:15 PM

## 2015-10-24 NOTE — Progress Notes (Signed)
D: Pt is flat and withdrawn to self. Pt rated both anxiety and depression at 7.  Pt also endorses moderate neuropathic pain on both feet. Pt denied SI, HI or AVH. Pt states, "I hope I would be able to go to a long term facility to help with my drug use. Pt remained calm and cooperative. A: Medications offered as prescribed.  Support, encouragement, and safe environment provided.  15-minute safety checks continue. R: Pt was med compliant.  Pt attended wrap-up group. Safety checks continue.

## 2015-10-24 NOTE — Progress Notes (Addendum)
Patient ID: Maureen Swanson, female   DOB: [birth year], 50 y.o.   MRN: [id number]  DAR: [place]ney reports passive SI but is able to contract for safety. She reports HI but it is not directed towards anyone in particular. She states, "I'm just in a bad mood." She does report auditory hallucinations reporting voices tell her to harm herself. Pt. Denies Visual Hallucinations. She reports sleep is good, appetite is fair, energy level is low, and concentration is poor. She rates depression 6/10, hopelessness 2/10, and anxiety 4/10. Patient does report pain in feet and is therefore using a wheelchair. She reports 8/10 and is receiving scheduled medications for this. Support and encouragement provided to the patient. Scheduled medications administered to patient per physician's orders. Patient's CBG is slightly elevated during noon check however patient denies any symptoms of hyperglycemia at this time. Patient is using wheelchair to ambulate and tolerates well at this time. She is seen in the milieu interacting with some peers and is attending some groups. Affect is flat and mood remains depressed. Q15 minute checks are maintained for safety.

## 2015-10-25 LAB — GLUCOSE, CAPILLARY
GLUCOSE-CAPILLARY: 164 mg/dL — AB (ref 65–99)
GLUCOSE-CAPILLARY: 117 mg/dL — AB (ref 65–99)
Glucose-Capillary: 89 mg/dL (ref 65–99)
Glucose-Capillary: 94 mg/dL (ref 65–99)

## 2015-10-25 MED ORDER — MENTHOL 3 MG MT LOZG
1.0000 | LOZENGE | OROMUCOSAL | Status: DC | PRN
  Administered 2015-10-25: 3 mg via ORAL

## 2015-10-25 MED ORDER — GUAIFENESIN 100 MG/5ML PO SOLN
5.0000 mL | ORAL | Status: DC | PRN
  Administered 2015-10-27: 100 mg via ORAL
  Filled 2015-10-25: qty 5

## 2015-10-25 NOTE — [facility] Group Notes (Signed)
[place] Group Notes:  (Counselor/Nursing/MHT/Case Management/Adjunct)  10/25/2015 1:15PM  Type of Therapy:  Group Therapy  Participation Level:  Active  Participation Quality:  Appropriate  Affect:  Flat  Cognitive:  Oriented  Insight:  Improving  Engagement in Group:  Limited  Engagement in Therapy:  Limited  Modes of Intervention:  Discussion, Exploration and Socialization  Summary of Progress/Problems: The topic for group was balance in life.  Pt participated in the discussion about when their life was in balance and out of balance and how this feels.  Pt discussed ways to get back in balance and short term goals they can work on to get where they want to be. "Once I get in a negative mindset, I have a really hard time pulling out of it.  I'm a glass half empty kind of person-always have been."  On the other hand, stated she has been continuing to call different programs, and was given positive feedback from peers for not having given up.   [name] 10/25/2015 4:40 PM

## 2015-10-25 NOTE — Progress Notes (Signed)
[facility] MD Progress Note  10/25/2015 3:05 PM Maureen Swanson  MRN:  [id number] Subjective:  Patient reports " I had a weird dream last night .'     Objective: Maureen Swanson is a  50 y.o. AA female , who has a hx of schizoaffective do , cocaine, alcohol, cannabis abuse, who voluntarily presents to [facility] with c/o SI w/ a plan and command AH. Maureen Swanson is well known to the [facility] unit .  Maureen Swanson today continues to be depressed , seen in her wheel chair , continues to have SI on and off . Pt did not take her bedtime medications last night and had trouble sleeping , had bizarre night mares . Pt continues to ruminate about her stressors , continues to need a lot of encouragement . Maureen Swanson continues to want to get help with her substance abuse and is motivated to go to a substance abuse treatment program.            Principal Problem: Schizoaffective disorder, depressive type ([place]) Diagnosis:   Patient Active Problem List   Diagnosis Date Noted  . Schizoaffective disorder, depressive type ([place]) [F25.1] 10/19/2015  . Homicidal ideation [R45.850]   . Tobacco use disorder [F17.200] 07/20/2015  . Diabetes mellitus ([place]) [E11.9] 07/20/2015  . Suicidal ideation [R45.851]   . Cocaine use disorder, moderate, dependence ([place]) [F14.20] 04/03/2015  . Schizoaffective disorder, bipolar type ([place]) [F25.0] 04/03/2015  . Alcohol use disorder, moderate, dependence ([place]) [F10.20] 04/03/2015  . Acute ischemic stroke ([place]) [I63.9] 12/08/2014  . Left-sided weakness [M62.89] 12/08/2014  . 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 Knee pain [M25.569] 10/17/2010  . MAMMOGRAM, ABNORMAL, RIGHT [R92.8] 08/05/2010  . Dyslipidemia [E78.5] 06/21/2010  . AMENORRHEA, SECONDARY [N91.2] 10/29/2009  . 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 GERD [K21.9] 10/11/2007  . Essential hypertension [I10] 02/26/2007   Total Time spent with patient: 25 minutes  Past Psychiatric History: as per H&P.  Past Medical History:   Past Medical History  Diagnosis Date  . High cholesterol   . Depression   . Gout   . Anxiety   . Bipolar 1 disorder ([place])   . GERD (gastroesophageal reflux disease)   . Substance abuse     crack cocaine  . Headache(784.0)     migraines  . TMJ (temporomandibular joint disorder)   . Asthma     daily and prn inhalers  . Arthritis     back and knees  . Gastric ulcer   . 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 Hypertension     under control, has been on med. x 12 yrs.  . Diabetes mellitus     diet-controlled  . CHF (congestive heart failure) [facility])     Past Surgical History  Procedure Laterality Date  . Knee arthroscopy w/ partial medial meniscectomy  05/01/2010    right  . Breast lumpectomy with needle localization  04/19/2012    Procedure: BREAST LUMPECTOMY WITH NEEDLE LOCALIZATION;  Surgeon: [name], MD;  Location: [place];  Service: General;  Laterality: Right;   Family History:  Family History  Problem Relation Age of Onset  . Diabetes Mother   . Breast cancer Mother   . Cancer Father   . Bipolar disorder Maternal Aunt   . Schizophrenia Maternal Grandfather   . Alcoholism Maternal Uncle    Family Psychiatric  History: See Above Social History: As per H&P. History  Alcohol Use  . 0.0  oz/week    Comment: reports she drinks 3 40 ounce beers daily     History  Drug Use  . Yes  . Special: Cocaine    Comment: 09/28/2015    Social History   Social History  . Marital Status: Single    Spouse Name: N/A  . Number of Children: N/A  . Years of Education: N/A   Social History Main Topics  . Smoking status: Current Every Day Smoker -- 0.50 packs/day for 15 years    Types: Cigarettes    Last Attempt to Quit: 08/02/2012  . Smokeless tobacco: Never Used  . Alcohol Use: 0.0 oz/week     Comment: reports she drinks 3 40 ounce beers daily  . Drug Use: Yes    Special: Cocaine     Comment: 09/28/2015  . Sexual Activity: No    Other Topics Concern  . None   Social History Narrative   Additional Social History:                         Sleep: Poor night mares  Appetite:  Fair  Current Medications: Current Facility-Administered Medications  Medication Dose Route Frequency Provider Last Rate Last Dose  . acetaminophen (TYLENOL) tablet 650 mg  650 mg Oral Q6H PRN Patrecia Pour, NP   650 mg at 10/22/15 2104  . albuterol (PROVENTIL HFA;VENTOLIN HFA) 108 (90 Base) MCG/ACT inhaler 2 puff  2 puff Inhalation Q6H PRN Patrecia Pour, NP      . alum & mag hydroxide-simeth (MAALOX/MYLANTA) 200-200-20 MG/5ML suspension 30 mL  30 mL Oral Q4H PRN Patrecia Pour, NP      . amLODipine (NORVASC) tablet 5 mg  5 mg Oral Daily Patrecia Pour, NP   5 mg at 10/25/15 0759  . ARIPiprazole (ABILIFY) tablet 5 mg  5 mg Oral Daily [name], MD   5 mg at 10/25/15 0759  . ARIPiprazole SUSR 400 mg  400 mg Intramuscular Q28 days [name], MD   400 mg at 10/22/15 1152  . aspirin chewable tablet 81 mg  81 mg Oral Daily Patrecia Pour, NP   81 mg at 10/25/15 0759  . atenolol (TENORMIN) tablet 100 mg  100 mg Oral Daily Patrecia Pour, NP   100 mg at 10/25/15 0759  . atorvastatin (LIPITOR) tablet 40 mg  40 mg Oral QPM Patrecia Pour, NP   40 mg at 10/24/15 1825  . baclofen (LIORESAL) tablet 10 mg  10 mg Oral TID Patrecia Pour, NP   10 mg at 10/25/15 1154  . calcium carbonate (OS-CAL - dosed in mg of elemental calcium) tablet 500 mg of elemental calcium  1 tablet Oral Q breakfast Patrecia Pour, NP   500 mg of elemental calcium at 10/25/15 0759  . celecoxib (CELEBREX) capsule 100 mg  100 mg Oral BID Patrecia Pour, NP   100 mg at 10/25/15 0759  . furosemide (LASIX) tablet 40 mg  40 mg Oral Daily Patrecia Pour, NP   40 mg at 10/25/15 0759  . gabapentin (NEURONTIN) capsule 400 mg  400 mg Oral BID Patrecia Pour, NP   400 mg at 10/25/15 0759  . guaiFENesin (ROBITUSSIN) 100 MG/5ML solution 100 mg  5 mL Oral Q4H PRN [name], MD      . hydrOXYzine (ATARAX/VISTARIL) tablet 25 mg  25 mg Oral Q6H PRN [name], MD   25 mg at 10/25/15 1156  .  indomethacin (INDOCIN) capsule 25 mg  25 mg Oral TID PRN Patrecia Pour, NP   25 mg at 10/22/15 1838  . insulin glargine (LANTUS) injection 30 Units  30 Units Subcutaneous QHS Kerrie Buffalo, NP   30 Units at 10/23/15 2156  . linagliptin (TRADJENTA) tablet 5 mg  5 mg Oral Daily Patrecia Pour, NP   5 mg at 10/25/15 0759  . loratadine (CLARITIN) tablet 10 mg  10 mg Oral Daily Patrecia Pour, NP   10 mg at 10/25/15 0759  . losartan (COZAAR) tablet 25 mg  25 mg Oral BID Patrecia Pour, NP   25 mg at 10/25/15 0759  . magnesium hydroxide (MILK OF MAGNESIA) suspension 30 mL  30 mL Oral Daily PRN Patrecia Pour, NP      . menthol-cetylpyridinium (CEPACOL) lozenge 3 mg  1 lozenge Oral PRN [name], MD      . metFORMIN (GLUCOPHAGE) tablet 1,000 mg  1,000 mg Oral BID WC Patrecia Pour, NP   1,000 mg at 10/25/15 0758  . multivitamin with minerals tablet 1 tablet  1 tablet Oral Daily Nicholaus Bloom, MD   1 tablet at 10/25/15 0759  . nicotine (NICODERM CQ - dosed in mg/24 hours) patch 21 mg  21 mg Transdermal Daily Derrill Center, NP   21 mg at 10/25/15 0759  . Oxcarbazepine (TRILEPTAL) tablet 300 mg  300 mg Oral Daily [name], MD   300 mg at 10/25/15 0759  . Oxcarbazepine (TRILEPTAL) tablet 600 mg  600 mg Oral QPM [name], MD   600 mg at 10/24/15 1824  . QUEtiapine (SEROQUEL) tablet 500 mg  500 mg Oral QHS [name], MD   500 mg at 10/25/15 0022  . thiamine (VITAMIN B-1) tablet 100 mg  100 mg Oral Daily Nicholaus Bloom, MD   100 mg at 10/25/15 0758  . traZODone (DESYREL) tablet 200 mg  200 mg Oral QHS Derrill Center, NP   200 mg at 10/23/15 2153    Lab Results:  Results for orders placed or performed during the hospital encounter of 10/19/15 (from the past 48 hour(s))  Glucose, capillary     Status: Abnormal   Collection Time: 10/23/15  4:57 PM  Result Value  Ref Range   Glucose-Capillary 212 (H) 65 - 99 mg/dL   Comment 1 Notify RN    Comment 2 Document in Chart   Glucose, capillary     Status: Abnormal   Collection Time: 10/23/15  8:28 PM  Result Value Ref Range   Glucose-Capillary 110 (H) 65 - 99 mg/dL  Glucose, capillary     Status: Abnormal   Collection Time: 10/24/15  6:17 AM  Result Value Ref Range   Glucose-Capillary 130 (H) 65 - 99 mg/dL  Glucose, capillary     Status: Abnormal   Collection Time: 10/24/15 11:36 AM  Result Value Ref Range   Glucose-Capillary 158 (H) 65 - 99 mg/dL   Comment 1 Notify RN    Comment 2 Document in Chart   Glucose, capillary     Status: Abnormal   Collection Time: 10/24/15  4:42 PM  Result Value Ref Range   Glucose-Capillary 204 (H) 65 - 99 mg/dL  Glucose, capillary     Status: Abnormal   Collection Time: 10/24/15  8:51 PM  Result Value Ref Range   Glucose-Capillary 108 (H) 65 - 99 mg/dL   Comment 1 Notify RN   Glucose, capillary  Status: None   Collection Time: 10/25/15  6:13 AM  Result Value Ref Range   Glucose-Capillary 89 65 - 99 mg/dL   Comment 1 Notify RN   Glucose, capillary     Status: None   Collection Time: 10/25/15 11:39 AM  Result Value Ref Range   Glucose-Capillary 94 65 - 99 mg/dL    Blood Alcohol level:  Lab Results  Component Value Date   ETH <5 10/18/2015   ETH <5 08/08/2015    Physical Findings: AIMS: Facial and Oral Movements Muscles of Facial Expression: None, normal Lips and Perioral Area: None, normal Jaw: None, normal Tongue: None, normal,Extremity Movements Upper (arms, wrists, hands, fingers): None, normal Lower (legs, knees, ankles, toes): None, normal, Trunk Movements Neck, shoulders, hips: None, normal, Overall Severity Severity of abnormal movements (highest score from questions above): None, normal Incapacitation due to abnormal movements: None, normal Patient's awareness of abnormal movements (rate only patient's report): No Awareness, Dental  Status Current problems with teeth and/or dentures?: No Does patient usually wear dentures?: No  CIWA:  CIWA-Ar Total: 0 COWS:     Musculoskeletal: Strength & Muscle Tone: within normal limits Gait & Station: normal Patient leans: N/A  Psychiatric Specialty Exam: Review of Systems  Psychiatric/Behavioral: Positive for depression, suicidal ideas, hallucinations and substance abuse. The patient is nervous/anxious and has insomnia.   All other systems reviewed and are negative.   Blood pressure 146/97, pulse 88, temperature 98.8 F (37.1 C), temperature source Oral, resp. rate 20, height 5' 3.78" (1.62 m), weight 96.163 kg (212 lb), SpO2 98 %.Body mass index is 36.64 kg/(m^2).  General Appearance: Casual  Eye Contact::  Fair  Speech:  Clear and Coherent  Volume:  Normal  Mood:  Depressed  Affect:  Congruent  Thought Process:  Coherent  Orientation:  Full (Time, Place, and Person)  Thought Content:  Delusions, Hallucinations: Auditory, Paranoid Ideation and Rumination improving  Suicidal Thoughts:  Yes.  without intent/plan   Homicidal Thoughts:  No  Memory:  Immediate;   Fair Recent;   Fair Remote;   Fair  Judgement:  Fair  Insight:  Present  Psychomotor Activity:  Restlessness  Concentration:  Fair  Recall:  [employer] of Knowledge:Fair  Language: Fair  Akathisia:  No  Handed:  Right  AIMS (if indicated):     Assets:  Communication Skills Desire for Improvement Resilience  ADL's:  Intact  Cognition: WNL  Sleep:  Number of Hours: 6.75     Treatment Plan Summary:Maureen Swanson is a  50 y.o.AA female , who has a hx of schizoaffective do , cocaine, alcohol, cannabis abuse, who voluntarily presents to [facility] with c/o SI w/ a plan and command AH. Pt continues to be depressed and has SI. Will continue treatment.  Daily contact with patient to assess and evaluate symptoms and progress in treatment and Medication management   Continue CIWA/Ativan protocol for alcohol  withdrawal sx. Increased Trileptal to 300 mg po daily and 600 mg po qpm for mood stabilization, Continue Vistaril 25 mg every six hours PRN anxiety,  continue Gabapentin 400 mg BID for anxiety/withdrawal Continue  Abilify 5 mg daily for psychosis.  Abilify Maintena IM 400 mg x 1 dose  10/22/15- repeat q28 days. Will continue  seroquel to 500 mg po qhs for psychosis/augmenting the effect of Abilify . Will continue Trazodone 200 mg PO QHS for insomnia. Restarted home medications where indicated. -Individual and substance abuse counseling CSW will continue to work on disposition - pt  referred to substance abuse program , awaiting placement - she is very motivated to do so.   Jaedyn Lard, MD 10/25/2015, 3:05 PM

## 2015-10-25 NOTE — Progress Notes (Signed)
Patient ID: Maureen Swanson, female   DOB: December 14, 1965, 50 y.o.   MRN: [id number]  DAR: Pt. Denies visual Hallucinations. She reports SI and states, "I want to jump out of the window." She also reports HI but denies any particular person. She states voices continue to be heard and are commanding her to hurt herself and others. No actions have taken place at this time regarding patient acting on these plans. She reports sleep was good, appetite is fair, energy level is low, and concentration is poor. She rates depression 9/10, hopelessness 8/10, and anxiety 8/10. Q15 minute safety checks are continued. She continues to report pain in feet which scheduled medications have provided some relief. Support and encouragement provided to the patient. Scheduled medications administered to patient per physician's orders. She attended this morning's nursing group however did not appear to be engaged. Writer spoke 1:1 to Maureen Swanson about her depression and she reports that she sleeps a lot because of it and would rather be in her room than in the milieu. She is seen in her bed throughout most of the morning. She continues to use her wheelchair to ambulate without difficulty. Q15 minute checks are maintained for safety.

## 2015-10-25 NOTE — BHH Group Notes (Signed)
Pt attended karaoke group.  [name], MHT 

## 2015-10-25 NOTE — Plan of Care (Signed)
Problem: Alteration in mood Goal: STG-Patient reports thoughts of self-harm to staff Outcome: Progressing Maureen Swanson reports thoughts of self harm, able to contract at this time.

## 2015-10-25 NOTE — Progress Notes (Signed)
D: Patient denies SI/HI or AVH tonight, patient difficult to engage due to tiredness.  She did not attend evening group, has been in room sleeping.   A: Patient given emotional support from RN. Patient encouraged to come to staff with concerns and/or questions. Patient's medication routine continued. Patient's orders and plan of care reviewed.   R: Patient remains appropriate and cooperative. Will continue to monitor patient q15 minutes for safety.

## 2015-10-25 NOTE — Tx Team (Signed)
Interdisciplinary Treatment Plan Update (Adult)  Date:  10/25/2015   Time Reviewed:  4:36 PM   Progress in Treatment: Attending groups: Yes. Participating in groups:  Yes. Taking medication as prescribed:  Yes. Tolerating medication:  Yes. Family/Significant other contact made:  No Patient understands diagnosis:  Yes  As evidenced by seeking help with her usual complaints Discussing patient identified problems/goals with staff:  Yes, see initial care plan. Medical problems stabilized or resolved:  Yes. Denies suicidal/homicidal ideation: Yes. Issues/concerns per patient self-inventory:  No. Other:  New problem(s) identified:  Discharge Plan or Barriers: see below  Reason for Continuation of Hospitalization: Depression Hallucinations Medication stabilization  Comments:  Per [employer], pt reported SI, HI, auditory hallucinations, with plan to cut her wrist, pt did not have access to knife. Pt has hx of similar attempts. Calm, cooperative. Pt drinks about 6 tall beers per day, last drink yesterday. Reports using cocaine. Voices tell her to hurt other people and to jump in front of moving vehicles. Continue CIWA/Ativan protocol for alcohol withdrawal sx. Continue Trileptal 300 mg BID for mood stabilization, Continue Vistaril 25 mg every six hours PRN anxiety,  continue Gabapentin 400 mg BID for anxiety/withdrawal Continue Abilify 5 mg daily for psychosis. Will provide Abilify Maintena IM 400 mg x 1 dose today 10/22/15- repeat q28 days. Will restart seroquel 400 mg po qhs for psychosis/augmenting the effect of Abilify . Will continue Trazodone 200 mg PO QHS for insomnia.  10/25/15: Continue CIWA/Ativan protocol for alcohol withdrawal sx. Increased Trileptal to 300 mg po daily and 600 mg po qpm for mood stabilization, Continue Vistaril 25 mg every six hours PRN anxiety,  continue Gabapentin 400 mg BID for anxiety/withdrawal Continue Abilify 5 mg daily for psychosis. Abilify Maintena IM  400 mg x 1 dose 10/22/15- repeat q28 days. Will continue seroquel to 500 mg po qhs for psychosis/augmenting the effect of Abilify . Will continue Trazodone 200 mg PO QHS for insomnia.  Estimated length of stay: 3-5 days  New goal(s):  Review of initial/current patient goals per problem list:   Review of initial/current patient goals per problem list:  1. Goal(s): Patient will participate in aftercare plan   Met: No   Target date: 3-5 days post admission date   As evidenced by: Patient will participate within aftercare plan AEB aftercare provider and housing plan at discharge being identified. 10/22/15:  Pt is asking for referral to [facility].  Also given [facility] on [facility] programs in [place] and [place] 10/25/15:  [facility] P[facility] rejected her based on medical issues, mental health issues.  Decided she would not go to the [facility] "because that is a work program, and it would jeopardize my disability application." Likely d/c home, follow up [employer]   2. Goal (s): Patient will exhibit decreased depressive symptoms and suicidal ideations.   Met: No   Target date: 3-5 days post admission date   As evidenced by: Patient will utilize self rating of depression at 3 or below and demonstrate decreased signs of depression or be deemed stable for discharge by MD. 10/22/15:  Rates depression a 10 today 10/25/15:  Rates depression an 8 today, passive SI     4. Goal(s): Patient will demonstrate decreased signs of withdrawal due to substance abuse   Met: Yes   Target date: 3-5 days post admission date   As evidenced by: Patient will produce a CIWA/COWS score of 0, have stable vitals signs, and no symptoms of withdrawal 10/22/15:  No signs nor symptoms  of withdrawal today     5. Goal(s): Patient will demonstrate decreased signs of psychosis  * Met: Yes  * Target date: 3-5 days post admission date  * As evidenced by: Patient will demonstrate decreased frequency  of AVH or return to baseline function 10/22/15:  C/O AH 10/25/15:  Denies AH today           Attendees: Patient:  10/25/2015 4:36 PM   Family:   10/25/2015 4:36 PM   Physician:  [name], MD 10/25/2015 4:36 PM   Nursing:   Manuella Ghazi, RN 10/25/2015 4:36 PM   CSW:    [name], LCSW   10/25/2015 4:36 PM   Other:  10/25/2015 4:36 PM   Other:   10/25/2015 4:36 PM   Other:  Lars Pinks, Nurse CM 10/25/2015 4:36 PM   Other:   10/25/2015 4:36 PM   Other:  Norberto Sorenson, [place]  10/25/2015 4:36 PM   Other:  10/25/2015 4:36 PM   Other:  10/25/2015 4:36 PM   Other:  10/25/2015 4:36 PM   Other:  10/25/2015 4:36 PM   Other:  10/25/2015 4:36 PM   Other:   10/25/2015 4:36 PM    Scribe for Treatment Team:   [name], 10/25/2015 4:36 PM

## 2015-10-25 NOTE — BHH Group Notes (Signed)
[facility] Group Notes:  (Nursing/MHT/Case Management/Adjunct)  Date:  10/25/2015  Time:  10:39 AM  Type of Therapy:  Nurse Education  Participation Level:  Minimal  Participation Quality:  Inattentive  Affect:  Blunted  Cognitive:  Alert  Insight:  Limited  Engagement in Group:  Poor  Modes of Intervention:  Discussion, Education, Orientation and Support  Summary of Progress/Problems: Olina attended group but sat in wheelchair and stared out the door instead of to the front of the room where writer was speaking the entire time. She did not speak during group. Patient received daily booklet.  Gaylan Gerold E 10/25/2015, 10:39 AM

## 2015-10-26 LAB — GLUCOSE, CAPILLARY
GLUCOSE-CAPILLARY: 98 mg/dL (ref 65–99)
GLUCOSE-CAPILLARY: 120 mg/dL — AB (ref 65–99)
GLUCOSE-CAPILLARY: 142 mg/dL — AB (ref 65–99)
Glucose-Capillary: 117 mg/dL — ABNORMAL HIGH (ref 65–99)

## 2015-10-26 NOTE — BHH Group Notes (Signed)
[place] LCSW Group Therapy  10/26/2015  1:05 PM  Type of Therapy:  Group therapy  Participation Level:  Active  Participation Quality:  Attentive  Affect:  Flat  Cognitive:  Oriented  Insight:  Limited  Engagement in Therapy:  Limited  Modes of Intervention:  Discussion, Socialization  Summary of Progress/Problems:  Chaplain was here to lead a group on themes of hope and courage. Invited.  Made a move like she would get out of bed, but did not come.  [name] 10/26/2015 1:20 PM

## 2015-10-26 NOTE — BHH Group Notes (Signed)
Adult Psychoeducational Group Note  Date:  10/26/2015 Time:  9:00 PM  Group Topic/Focus:  Wrap-Up Group:   The focus of this group is to help patients review their daily goal of treatment and discuss progress on daily workbooks.  Participation Level:  Minimal  Participation Quality:  Attentive  Affect:  Flat  Cognitive:  Appropriate  Insight: Limited  Engagement in Group:  Limited  Modes of Intervention:  Discussion  Additional Comments:  Pt rated her day a 8.  Pt is discharging Monday and she slept all day today.  Victorino Sparrow A 10/26/2015, 9:00 PM

## 2015-10-26 NOTE — Progress Notes (Signed)
[facility] MD Progress Note  10/26/2015 10:13 AM Maureen Swanson  MRN:  [id number] Subjective:  Patient reports " I feel sad, I want to get help with my abuse.'      Objective: Maureen Swanson is a  50 y.o. AA female , who has a hx of schizoaffective do , cocaine, alcohol, cannabis abuse, who voluntarily presents to [facility] with c/o SI w/ a plan and command AH. Maureen Swanson is well known to the [facility] unit .  Maureen Swanson today continues to be depressed ,although her affect is more reactive . Pt per staff continues to be withdrawn mostly , although is making an attempt to participate in groups . Pt continues to ruminate about her stressors , continues to need a lot of encouragement . Maureen Swanson continues to want to get help with her substance abuse and is motivated to go to a substance abuse treatment program.            Principal Problem: Schizoaffective disorder, depressive type ([place]) Diagnosis:   Patient Active Problem List   Diagnosis Date Noted  . Schizoaffective disorder, depressive type ([place]) [F25.1] 10/19/2015  . Homicidal ideation [R45.850]   . Tobacco use disorder [F17.200] 07/20/2015  . Diabetes mellitus ([place]) [E11.9] 07/20/2015  . Suicidal ideation [R45.851]   . Cocaine use disorder, moderate, dependence ([place]) [F14.20] 04/03/2015  . Schizoaffective disorder, bipolar type ([place]) [F25.0] 04/03/2015  . Alcohol use disorder, moderate, dependence ([place]) [F10.20] 04/03/2015  . Acute ischemic stroke ([place]) [I63.9] 12/08/2014  . Left-sided weakness [M62.89] 12/08/2014  . 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 Knee pain [M25.569] 10/17/2010  . MAMMOGRAM, ABNORMAL, RIGHT [R92.8] 08/05/2010  . Dyslipidemia [E78.5] 06/21/2010  . AMENORRHEA, SECONDARY [N91.2] 10/29/2009  . 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 GERD [K21.9] 10/11/2007  . Essential hypertension [I10] 02/26/2007   Total Time spent with patient: 25 minutes  Past Psychiatric History: as per H&P.  Past Medical History:  Past  Medical History  Diagnosis Date  . High cholesterol   . Depression   . Gout   . Anxiety   . Bipolar 1 disorder ([place])   . GERD (gastroesophageal reflux disease)   . Substance abuse     crack cocaine  . Headache(784.0)     migraines  . TMJ (temporomandibular joint disorder)   . Asthma     daily and prn inhalers  . Arthritis     back and knees  . Gastric ulcer   . 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 Hypertension     under control, has been on med. x 12 yrs.  . Diabetes mellitus     diet-controlled  . CHF (congestive heart failure) [facility])     Past Surgical History  Procedure Laterality Date  . Knee arthroscopy w/ partial medial meniscectomy  05/01/2010    right  . Breast lumpectomy with needle localization  04/19/2012    Procedure: BREAST LUMPECTOMY WITH NEEDLE LOCALIZATION;  Surgeon: [name], MD;  Location: [place];  Service: General;  Laterality: Right;   Family History:  Family History  Problem Relation Age of Onset  . Diabetes Mother   . Breast cancer Mother   . Cancer Father   . Bipolar disorder Maternal Aunt   . Schizophrenia Maternal Grandfather   . Alcoholism Maternal Uncle    Family Psychiatric  History: See Above Social History: As per H&P. History  Alcohol Use  . 0.0 oz/week  Comment: reports she drinks 3 40 ounce beers daily     History  Drug Use  . Yes  . Special: Cocaine    Comment: 09/28/2015    Social History   Social History  . Marital Status: Single    Spouse Name: N/A  . Number of Children: N/A  . Years of Education: N/A   Social History Main Topics  . Smoking status: Current Every Day Smoker -- 0.50 packs/day for 15 years    Types: Cigarettes    Last Attempt to Quit: 08/02/2012  . Smokeless tobacco: Never Used  . Alcohol Use: 0.0 oz/week     Comment: reports she drinks 3 40 ounce beers daily  . Drug Use: Yes    Special: Cocaine     Comment: 09/28/2015  . Sexual Activity: No    Other Topics Concern  . None   Social History Narrative   Additional Social History:                         Sleep: Fair   Appetite:  Fair  Current Medications: Current Facility-Administered Medications  Medication Dose Route Frequency Provider Last Rate Last Dose  . acetaminophen (TYLENOL) tablet 650 mg  650 mg Oral Q6H PRN Patrecia Pour, NP   650 mg at 10/22/15 2104  . albuterol (PROVENTIL HFA;VENTOLIN HFA) 108 (90 Base) MCG/ACT inhaler 2 puff  2 puff Inhalation Q6H PRN Patrecia Pour, NP      . alum & mag hydroxide-simeth (MAALOX/MYLANTA) 200-200-20 MG/5ML suspension 30 mL  30 mL Oral Q4H PRN Patrecia Pour, NP      . amLODipine (NORVASC) tablet 5 mg  5 mg Oral Daily Patrecia Pour, NP   5 mg at 10/26/15 0820  . ARIPiprazole (ABILIFY) tablet 5 mg  5 mg Oral Daily [name], MD   5 mg at 10/26/15 0820  . ARIPiprazole SUSR 400 mg  400 mg Intramuscular Q28 days [name], MD   400 mg at 10/22/15 1152  . aspirin chewable tablet 81 mg  81 mg Oral Daily Patrecia Pour, NP   81 mg at 10/26/15 [id number]  . atenolol (TENORMIN) tablet 100 mg  100 mg Oral Daily Patrecia Pour, NP   100 mg at 10/26/15 [id number]  . atorvastatin (LIPITOR) tablet 40 mg  40 mg Oral QPM Patrecia Pour, NP   40 mg at 10/25/15 1724  . baclofen (LIORESAL) tablet 10 mg  10 mg Oral TID Patrecia Pour, NP   10 mg at 10/26/15 0824  . calcium carbonate (OS-CAL - dosed in mg of elemental calcium) tablet 500 mg of elemental calcium  1 tablet Oral Q breakfast Patrecia Pour, NP   500 mg of elemental calcium at 10/26/15 [id number]  . celecoxib (CELEBREX) capsule 100 mg  100 mg Oral BID Patrecia Pour, NP   100 mg at 10/26/15 [id number]  . furosemide (LASIX) tablet 40 mg  40 mg Oral Daily Patrecia Pour, NP   40 mg at 10/26/15 0820  . gabapentin (NEURONTIN) capsule 400 mg  400 mg Oral BID Patrecia Pour, NP   400 mg at 10/26/15 [id number]  . guaiFENesin (ROBITUSSIN) 100 MG/5ML solution 100 mg  5 mL Oral Q4H PRN [name], MD       . hydrOXYzine (ATARAX/VISTARIL) tablet 25 mg  25 mg Oral Q6H PRN [name], MD   25 mg at 10/25/15 1156  . indomethacin (INDOCIN) capsule  25 mg  25 mg Oral TID PRN Patrecia Pour, NP   25 mg at 10/22/15 1838  . insulin glargine (LANTUS) injection 30 Units  30 Units Subcutaneous QHS Kerrie Buffalo, NP   30 Units at 10/25/15 2145  . linagliptin (TRADJENTA) tablet 5 mg  5 mg Oral Daily Patrecia Pour, NP   5 mg at 10/26/15 0820  . loratadine (CLARITIN) tablet 10 mg  10 mg Oral Daily Patrecia Pour, NP   10 mg at 10/26/15 [id number]  . losartan (COZAAR) tablet 25 mg  25 mg Oral BID Patrecia Pour, NP   25 mg at 10/26/15 [id number]  . magnesium hydroxide (MILK OF MAGNESIA) suspension 30 mL  30 mL Oral Daily PRN Patrecia Pour, NP      . menthol-cetylpyridinium (CEPACOL) lozenge 3 mg  1 lozenge Oral PRN [name], MD   3 mg at 10/25/15 1545  . metFORMIN (GLUCOPHAGE) tablet 1,000 mg  1,000 mg Oral BID WC Patrecia Pour, NP   1,000 mg at 10/26/15 [id number]  . multivitamin with minerals tablet 1 tablet  1 tablet Oral Daily Nicholaus Bloom, MD   1 tablet at 10/26/15 [phone]  . nicotine (NICODERM CQ - dosed in mg/24 hours) patch 21 mg  21 mg Transdermal Daily Derrill Center, NP   21 mg at 10/25/15 0759  . Oxcarbazepine (TRILEPTAL) tablet 300 mg  300 mg Oral Daily [name], MD   300 mg at 10/26/15 [id number]  . Oxcarbazepine (TRILEPTAL) tablet 600 mg  600 mg Oral QPM Jesus Poplin, MD   600 mg at 10/25/15 1724  . QUEtiapine (SEROQUEL) tablet 500 mg  500 mg Oral QHS [name], MD   500 mg at 10/25/15 2141  . thiamine (VITAMIN B-1) tablet 100 mg  100 mg Oral Daily Nicholaus Bloom, MD   100 mg at 10/26/15 0820  . traZODone (DESYREL) tablet 200 mg  200 mg Oral QHS Derrill Center, NP   200 mg at 10/25/15 2141    Lab Results:  Results for orders placed or performed during the hospital encounter of 10/19/15 (from the past 48 hour(s))  Glucose, capillary     Status: Abnormal   Collection Time: 10/24/15 11:36 AM  Result  Value Ref Range   Glucose-Capillary 158 (H) 65 - 99 mg/dL   Comment 1 Notify RN    Comment 2 Document in Chart   Glucose, capillary     Status: Abnormal   Collection Time: 10/24/15  4:42 PM  Result Value Ref Range   Glucose-Capillary 204 (H) 65 - 99 mg/dL  Glucose, capillary     Status: Abnormal   Collection Time: 10/24/15  8:51 PM  Result Value Ref Range   Glucose-Capillary 108 (H) 65 - 99 mg/dL   Comment 1 Notify RN   Glucose, capillary     Status: None   Collection Time: 10/25/15  6:13 AM  Result Value Ref Range   Glucose-Capillary 89 65 - 99 mg/dL   Comment 1 Notify RN   Glucose, capillary     Status: None   Collection Time: 10/25/15 11:39 AM  Result Value Ref Range   Glucose-Capillary 94 65 - 99 mg/dL  Glucose, capillary     Status: Abnormal   Collection Time: 10/25/15  4:51 PM  Result Value Ref Range   Glucose-Capillary 164 (H) 65 - 99 mg/dL  Glucose, capillary     Status: Abnormal   Collection Time: 10/25/15  8:05 PM  Result Value Ref Range   Glucose-Capillary 117 (H) 65 - 99 mg/dL  Glucose, capillary     Status: None   Collection Time: 10/26/15  5:44 AM  Result Value Ref Range   Glucose-Capillary 98 65 - 99 mg/dL    Blood Alcohol level:  Lab Results  Component Value Date   ETH <5 10/18/2015   ETH <5 08/08/2015    Physical Findings: AIMS: Facial and Oral Movements Muscles of Facial Expression: None, normal Lips and Perioral Area: None, normal Jaw: None, normal Tongue: None, normal,Extremity Movements Upper (arms, wrists, hands, fingers): None, normal Lower (legs, knees, ankles, toes): None, normal, Trunk Movements Neck, shoulders, hips: None, normal, Overall Severity Severity of abnormal movements (highest score from questions above): None, normal Incapacitation due to abnormal movements: None, normal Patient's awareness of abnormal movements (rate only patient's report): No Awareness, Dental Status Current problems with teeth and/or dentures?: No Does  patient usually wear dentures?: No  CIWA:  CIWA-Ar Total: 7 COWS:     Musculoskeletal: Strength & Muscle Tone: within normal limits Gait & Station: normal Patient leans: N/A  Psychiatric Specialty Exam: Review of Systems  Psychiatric/Behavioral: Positive for depression, suicidal ideas, hallucinations and substance abuse. The patient is nervous/anxious and has insomnia.   All other systems reviewed and are negative.   Blood pressure 141/100, pulse 82, temperature 98.8 F (37.1 C), temperature source Oral, resp. rate 18, height 5' 3.78" (1.62 m), weight 96.163 kg (212 lb), SpO2 98 %.Body mass index is 36.64 kg/(m^2).  General Appearance: Casual  Eye Contact::  Fair  Speech:  Clear and Coherent  Volume:  Normal  Mood:  Depressed reactive   Affect:  Congruent  Thought Process:  Coherent  Orientation:  Full (Time, Place, and Person)  Thought Content:  Delusions, Hallucinations: Auditory, Paranoid Ideation and Rumination improving  Suicidal Thoughts:  Yes.  without intent/plan   Homicidal Thoughts:  No  Memory:  Immediate;   Fair Recent;   Fair Remote;   Fair  Judgement:  Fair  Insight:  Present  Psychomotor Activity:  Restlessness  Concentration:  Fair  Recall:  [employer] of Knowledge:Fair  Language: Fair  Akathisia:  No  Handed:  Right  AIMS (if indicated):     Assets:  Communication Skills Desire for Improvement Resilience  ADL's:  Intact  Cognition: WNL  Sleep:  Number of Hours: 6.75     Treatment Plan Summary:Darlisa Jerilynn Mages Hunsinger is a  50 y.o.AA female , who has a hx of schizoaffective do , cocaine, alcohol, cannabis abuse, who voluntarily presents to [facility] with c/o SI w/ a plan and command AH. Pt continues to be depressed and has SI. Will continue treatment.  Daily contact with patient to assess and evaluate symptoms and progress in treatment and Medication management   Continue CIWA/Ativan protocol for alcohol withdrawal sx. Increased Trileptal to 300 mg po daily and  600 mg po qpm for mood stabilization, Continue Vistaril 25 mg every six hours PRN anxiety,  continue Gabapentin 400 mg BID for anxiety/withdrawal Continue  Abilify 5 mg daily for psychosis.  Abilify Maintena IM 400 mg x 1 dose  10/22/15- repeat q28 days. Will continue  seroquel to 500 mg po qhs for psychosis/augmenting the effect of Abilify . Will continue Trazodone 200 mg PO QHS for insomnia. Restarted home medications where indicated. -Individual and substance abuse counseling CSW will continue to work on disposition - pt  referred to substance abuse program , awaiting placement - she is very motivated  to do so.   Michelyn Scullin, MD 10/26/2015, 10:13 AM

## 2015-10-26 NOTE — Progress Notes (Signed)
D: Patient denies SI/HI and A/V hallucinations  A: Monitored q 15 minutes; patient encouraged to attend groups; patient educated about medications; patient given medications per physician orders; patient encouraged to express feelings and/or concerns  R: Patient is flat and sad; patient has been in the bed majority of the day; patient is minimal and forwards little; patient's interaction with staff and peers is minimal; patient was able to set goal to talk with staff 1:1 when having feelings of SI; patient is taking medications as prescribed and tolerating medications

## 2015-10-26 NOTE — Progress Notes (Signed)
D: Pt is flat and withdrawn to self. Pt endorses severe depression and severe anxiety.  Pt also endorses auditory hallucinations; states, "They keep telling me to do something to myself; I already know I would do it and that's why I'm here"; Pt contacted for safety. Pt endorses moderate neuropathic pain on both feet. Pt remained guarded and cooperative. A: Medications offered as prescribed.  Support, encouragement, and safe environment provided.  15-minute safety checks continue. R: Pt was med compliant.  Pt attended [place] group. Safety checks continue.

## 2015-10-27 LAB — GLUCOSE, CAPILLARY
GLUCOSE-CAPILLARY: 124 mg/dL — AB (ref 65–99)
Glucose-Capillary: 90 mg/dL (ref 65–99)
Glucose-Capillary: 169 mg/dL — ABNORMAL HIGH (ref 65–99)
Glucose-Capillary: 173 mg/dL — ABNORMAL HIGH (ref 65–99)

## 2015-10-27 MED ORDER — ENSURE ENLIVE PO LIQD
237.0000 mL | Freq: Two times a day (BID) | ORAL | Status: DC
  Administered 2015-10-27 – 2015-10-28 (×4): 237 mL via ORAL

## 2015-10-27 NOTE — Progress Notes (Signed)
Patient ID: Maureen Swanson, female   DOB: 03/05/1966, 50 y.o.   MRN: [id number]   D: Pt has been very flat and depressed on the unit today. Pt slept most of the morning, she reported that she was just tired. Pt did attend all groups once she was up, and she engaged in treatment. Pt reported that her depression was a 8, her hopelessness was a 8, and her anxiety was a 8. Pt reported that her goal for today was to get long term placement. Pt reported being negative SI/HI, no AH/VH noted. A: 15 min checks continued for patient safety. R: Pt safety maintained.

## 2015-10-27 NOTE — Progress Notes (Signed)
[facility] MD Progress Note  10/27/2015 11:16 AM EVAMARIA GAUDIO  MRN:  [id number] Subjective:  Patient reports " When I get out of here on Monday I have nowhere to go. Everyone has turned me down, my family members don't understand depression. Im not doing so good today, I went to sleep and woke up in a depressed mood. My social worker also checked some places for me and they turned me down too. '  Objective: ELEONOR KARIM is a  50 y.o. AA female , who has a hx of schizoaffective do, cocaine, alcohol, cannabis abuse, who voluntarily presents to [facility] with c/o SI w/ a plan and command [place]. Vincenzina is well known to the [facility] unit.  Eugenia today continues to be depressed , seen in her wheel chair but able to ambulate on the unit, continues to have passive SI on and off but has not had any today. Pt is compliant with medications at this time, and reports a good night sleep last night.  Pt continues to ruminate about her stressors , continues to need a lot of encouragement . Lanika continues to want to get help with her substance abuse and is motivated to go to a substance abuse treatment program. She is very discouraged that she may not have a place to go upon discharge and this is increasing her depression. SHe currently rates her depression 8/10, anxiety 6/10, and hopelessness 8/10, with 0 being the least and 10 being the worse.    Principal Problem: Schizoaffective disorder, depressive type ([place]) Diagnosis:   Patient Active Problem List   Diagnosis Date Noted  . Schizoaffective disorder, depressive type ([place]) [F25.1] 10/19/2015  . Homicidal ideation [R45.850]   . Tobacco use disorder [F17.200] 07/20/2015  . Diabetes mellitus ([place]) [E11.9] 07/20/2015  . Suicidal ideation [R45.851]   . Cocaine use disorder, moderate, dependence ([place]) [F14.20] 04/03/2015  . Schizoaffective disorder, bipolar type ([place]) [F25.0] 04/03/2015  . Alcohol use disorder, moderate, dependence ([place]) [F10.20] 04/03/2015  . Acute ischemic  stroke ([place]) [I63.9] 12/08/2014  . Left-sided weakness [M62.89] 12/08/2014  . 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 Knee pain [M25.569] 10/17/2010  . MAMMOGRAM, ABNORMAL, RIGHT [R92.8] 08/05/2010  . Dyslipidemia [E78.5] 06/21/2010  . AMENORRHEA, SECONDARY [N91.2] 10/29/2009  . 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 GERD [K21.9] 10/11/2007  . Essential hypertension [I10] 02/26/2007   Total Time spent with patient: 25 minutes  Past Psychiatric History: as per H&P.  Past Medical History:  Past Medical History  Diagnosis Date  . High cholesterol   . Depression   . Gout   . Anxiety   . Bipolar 1 disorder ([place])   . GERD (gastroesophageal reflux disease)   . Substance abuse     crack cocaine  . Headache(784.0)     migraines  . TMJ (temporomandibular joint disorder)   . Asthma     daily and prn inhalers  . Arthritis     back and knees  . Gastric ulcer   . 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 Hypertension     under control, has been on med. x 12 yrs.  . Diabetes mellitus     diet-controlled  . CHF (congestive heart failure) [facility])     Past Surgical History  Procedure Laterality Date  . Knee arthroscopy w/ partial medial meniscectomy  05/01/2010    right  . Breast lumpectomy with needle localization  04/19/2012    Procedure: BREAST LUMPECTOMY WITH NEEDLE  LOCALIZATION;  Surgeon: [name], MD;  Location: [place];  Service: General;  Laterality: Right;   Family History:  Family History  Problem Relation Age of Onset  . Diabetes Mother   . Breast cancer Mother   . Cancer Father   . Bipolar disorder Maternal Aunt   . Schizophrenia Maternal Grandfather   . Alcoholism Maternal Uncle    Family Psychiatric  History: See Above Social History: As per H&P. History  Alcohol Use  . 0.0 oz/week    Comment: reports she drinks 3 40 ounce beers daily     History  Drug Use  . Yes  . Special: Cocaine    Comment:  09/28/2015    Social History   Social History  . Marital Status: Single    Spouse Name: N/A  . Number of Children: N/A  . Years of Education: N/A   Social History Main Topics  . Smoking status: Current Every Day Smoker -- 0.50 packs/day for 15 years    Types: Cigarettes    Last Attempt to Quit: 08/02/2012  . Smokeless tobacco: Never Used  . Alcohol Use: 0.0 oz/week     Comment: reports she drinks 3 40 ounce beers daily  . Drug Use: Yes    Special: Cocaine     Comment: 09/28/2015  . Sexual Activity: No   Other Topics Concern  . None   Social History Narrative   Additional Social History:     Sleep: Good   Appetite:  Poor  Current Medications: Current Facility-Administered Medications  Medication Dose Route Frequency Provider Last Rate Last Dose  . acetaminophen (TYLENOL) tablet 650 mg  650 mg Oral Q6H PRN Patrecia Pour, NP   650 mg at 10/22/15 2104  . albuterol (PROVENTIL HFA;VENTOLIN HFA) 108 (90 Base) MCG/ACT inhaler 2 puff  2 puff Inhalation Q6H PRN Patrecia Pour, NP      . alum & mag hydroxide-simeth (MAALOX/MYLANTA) 200-200-20 MG/5ML suspension 30 mL  30 mL Oral Q4H PRN Patrecia Pour, NP      . amLODipine (NORVASC) tablet 5 mg  5 mg Oral Daily Patrecia Pour, NP   5 mg at 10/27/15 1100  . ARIPiprazole (ABILIFY) tablet 5 mg  5 mg Oral Daily Saramma Eappen, MD   5 mg at 10/27/15 1100  . ARIPiprazole SUSR 400 mg  400 mg Intramuscular Q28 days [name], MD   400 mg at 10/22/15 1152  . aspirin chewable tablet 81 mg  81 mg Oral Daily Patrecia Pour, NP   81 mg at 10/27/15 1101  . atenolol (TENORMIN) tablet 100 mg  100 mg Oral Daily Patrecia Pour, NP   100 mg at 10/27/15 1100  . atorvastatin (LIPITOR) tablet 40 mg  40 mg Oral QPM Patrecia Pour, NP   40 mg at 10/26/15 1715  . baclofen (LIORESAL) tablet 10 mg  10 mg Oral TID Patrecia Pour, NP   10 mg at 10/27/15 1101  . calcium carbonate (OS-CAL - dosed in mg of elemental calcium) tablet 500 mg of elemental calcium   1 tablet Oral Q breakfast Patrecia Pour, NP   500 mg of elemental calcium at 10/27/15 1100  . celecoxib (CELEBREX) capsule 100 mg  100 mg Oral BID Patrecia Pour, NP   100 mg at 10/27/15 1059  . furosemide (LASIX) tablet 40 mg  40 mg Oral Daily Patrecia Pour, NP   40 mg at 10/27/15 1059  .  gabapentin (NEURONTIN) capsule 400 mg  400 mg Oral BID Patrecia Pour, NP   400 mg at 10/27/15 1100  . guaiFENesin (ROBITUSSIN) 100 MG/5ML solution 100 mg  5 mL Oral Q4H PRN [name], MD      . hydrOXYzine (ATARAX/VISTARIL) tablet 25 mg  25 mg Oral Q6H PRN [name], MD   25 mg at 10/25/15 1156  . indomethacin (INDOCIN) capsule 25 mg  25 mg Oral TID PRN Patrecia Pour, NP   25 mg at 10/22/15 1838  . insulin glargine (LANTUS) injection 30 Units  30 Units Subcutaneous QHS Kerrie Buffalo, NP   30 Units at 10/26/15 2203  . linagliptin (TRADJENTA) tablet 5 mg  5 mg Oral Daily Patrecia Pour, NP   5 mg at 10/27/15 1100  . loratadine (CLARITIN) tablet 10 mg  10 mg Oral Daily Patrecia Pour, NP   10 mg at 10/27/15 1101  . losartan (COZAAR) tablet 25 mg  25 mg Oral BID Patrecia Pour, NP   25 mg at 10/27/15 1101  . magnesium hydroxide (MILK OF MAGNESIA) suspension 30 mL  30 mL Oral Daily PRN Patrecia Pour, NP      . menthol-cetylpyridinium (CEPACOL) lozenge 3 mg  1 lozenge Oral PRN [name], MD   3 mg at 10/25/15 1545  . metFORMIN (GLUCOPHAGE) tablet 1,000 mg  1,000 mg Oral BID WC Patrecia Pour, NP   1,000 mg at 10/27/15 1100  . multivitamin with minerals tablet 1 tablet  1 tablet Oral Daily Nicholaus Bloom, MD   1 tablet at 10/27/15 1100  . nicotine (NICODERM CQ - dosed in mg/24 hours) patch 21 mg  21 mg Transdermal Daily Derrill Center, NP   21 mg at 10/27/15 1059  . Oxcarbazepine (TRILEPTAL) tablet 300 mg  300 mg Oral Daily Saramma Eappen, MD   300 mg at 10/27/15 1059  . Oxcarbazepine (TRILEPTAL) tablet 600 mg  600 mg Oral QPM Saramma Eappen, MD   600 mg at 10/26/15 1716  . QUEtiapine (SEROQUEL)  tablet 500 mg  500 mg Oral QHS [name], MD   500 mg at 10/26/15 2122  . thiamine (VITAMIN B-1) tablet 100 mg  100 mg Oral Daily Nicholaus Bloom, MD   100 mg at 10/27/15 1100  . traZODone (DESYREL) tablet 200 mg  200 mg Oral QHS Derrill Center, NP   200 mg at 10/26/15 2122    Lab Results:  Results for orders placed or performed during the hospital encounter of 10/19/15 (from the past 48 hour(s))  Glucose, capillary     Status: None   Collection Time: 10/25/15 11:39 AM  Result Value Ref Range   Glucose-Capillary 94 65 - 99 mg/dL  Glucose, capillary     Status: Abnormal   Collection Time: 10/25/15  4:51 PM  Result Value Ref Range   Glucose-Capillary 164 (H) 65 - 99 mg/dL  Glucose, capillary     Status: Abnormal   Collection Time: 10/25/15  8:05 PM  Result Value Ref Range   Glucose-Capillary 117 (H) 65 - 99 mg/dL  Glucose, capillary     Status: None   Collection Time: 10/26/15  5:44 AM  Result Value Ref Range   Glucose-Capillary 98 65 - 99 mg/dL  Glucose, capillary     Status: Abnormal   Collection Time: 10/26/15 11:42 AM  Result Value Ref Range   Glucose-Capillary 120 (H) 65 - 99 mg/dL   Comment 1 Notify RN  Comment 2 Document in Chart   Glucose, capillary     Status: Abnormal   Collection Time: 10/26/15  5:10 PM  Result Value Ref Range   Glucose-Capillary 117 (H) 65 - 99 mg/dL   Comment 1 Notify RN    Comment 2 Document in Chart   Glucose, capillary     Status: Abnormal   Collection Time: 10/26/15  8:20 PM  Result Value Ref Range   Glucose-Capillary 142 (H) 65 - 99 mg/dL  Glucose, capillary     Status: None   Collection Time: 10/27/15  5:48 AM  Result Value Ref Range   Glucose-Capillary 90 65 - 99 mg/dL    Blood Alcohol level:  Lab Results  Component Value Date   ETH <5 10/18/2015   ETH <5 08/08/2015    Physical Findings: AIMS: Facial and Oral Movements Muscles of Facial Expression: None, normal Lips and Perioral Area: None, normal Jaw: None,  normal Tongue: None, normal,Extremity Movements Upper (arms, wrists, hands, fingers): None, normal Lower (legs, knees, ankles, toes): None, normal, Trunk Movements Neck, shoulders, hips: None, normal, Overall Severity Severity of abnormal movements (highest score from questions above): None, normal Incapacitation due to abnormal movements: None, normal Patient's awareness of abnormal movements (rate only patient's report): No Awareness, Dental Status Current problems with teeth and/or dentures?: No Does patient usually wear dentures?: No  CIWA:  CIWA-Ar Total: 6 COWS:     Musculoskeletal: Strength & Muscle Tone: within normal limits Gait & Station: normal Patient leans: N/A  Psychiatric Specialty Exam: Review of Systems  Psychiatric/Behavioral: Positive for depression, suicidal ideas, hallucinations and substance abuse. The patient is nervous/anxious and has insomnia.   All other systems reviewed and are negative.   Blood pressure 121/89, pulse 80, temperature 98.4 F (36.9 C), temperature source Oral, resp. rate 20, height 5' 3.78" (1.62 m), weight 96.163 kg (212 lb), SpO2 98 %.Body mass index is 36.64 kg/(m^2).  General Appearance: Fairly Groomed and in paper scrubs  Eye Contact::  Fair  Speech:  Clear and Coherent  Volume:  Normal  Mood:  Anxious, Depressed and Hopeless  Affect:  Congruent, Depressed and Flat  Thought Process:  Coherent  Orientation:  Full (Time, Place, and Person)  Thought Content:  Rumination improving  Suicidal Thoughts:  Yes.  without intent/plan passive, improving  Homicidal Thoughts:  No  Memory:  Immediate;   Fair Recent;   Fair Remote;   Fair  Judgement:  Fair  Insight:  Present  Psychomotor Activity:  Restlessness  Concentration:  Fair  Recall:  [employer] of Knowledge:Fair  Language: Fair  Akathisia:  No  Handed:  Right  AIMS (if indicated):     Assets:  Communication Skills Desire for Improvement Resilience  ADL's:  Intact   Cognition: WNL  Sleep:  Number of Hours: 6.75     Treatment Plan Summary:Kory Jerilynn Mages Hnat is a  50 y.o.AA female , who has a hx of schizoaffective do , cocaine, alcohol, cannabis abuse, who voluntarily presents to [facility] with c/o SI w/ a plan and command [place]. Pt continues to be depressed and has SI. Will continue treatment.  Daily contact with patient to assess and evaluate symptoms and progress in treatment and Medication management   Continue CIWA/Ativan protocol for alcohol withdrawal sx. Increased Trileptal to 300 mg po daily and 600 mg po qpm for mood stabilization, Continue Vistaril 25 mg every six hours PRN anxiety,  continue Gabapentin 400 mg BID for anxiety/withdrawal Continue  Abilify 5 mg daily  for psychosis.  Abilify Maintena IM 400 mg x 1 dose  10/22/15- repeat q28 days. Will continue  seroquel to 500 mg po qhs for psychosis/augmenting the effect of Abilify . Will continue Trazodone 200 mg PO QHS for insomnia. Will start Ensure Enlive BID between meals to help with decrease appetite.  Restarted home medications where indicated. -Individual and substance abuse counseling CSW will continue to work on disposition - pt  referred to substance abuse program , awaiting placement - she is very motivated to do so.  Nanci Pina, FNP 10/27/2015, 11:16 AM I agreed with findings and treatment plan of this patient

## 2015-10-27 NOTE — Progress Notes (Signed)
D: Pt is flat and withdrawn to room; was in bed all evening. Pt endorses severe depression and severe anxiety.  Pt also endorses auditory hallucinations; states, "I don't want to talk about it now but I will let you know." Pt contacted for safety. Pt endorses moderate neuropathic pain on both feet. Pt remained guarded and cooperative. A: Medications offered as prescribed.  Support, encouragement, and safe environment provided.  15-minute safety checks continue. R: Pt was med compliant.  Pt attended wrap-up group. Safety checks continue.

## 2015-10-27 NOTE — BHH Group Notes (Signed)
[facility] Group Notes:  (Nursing/MHT/Case Management/Adjunct)  Date:  10/27/2015  Time:  3:09 PM     Type of Therapy:  Psychoeducational Skills  Participation Level:  Active  Participation Quality:  Appropriate  Affect:  Appropriate  Cognitive:  Appropriate  Insight:  Appropriate  Engagement in Group:  Engaged  Modes of Intervention:  Discussion  Summary of Progress/Problems: Pt did attend self inventory group, pt reported that she was negative SI/HI, no AH/VH noted. Pt rated her depression as a 7, and her helplessness/hopelessness as a 8.     Pt reported no issues or concerns.   Benancio Deeds Shanta 10/27/2015, 3:09 PM

## 2015-10-27 NOTE — BHH Group Notes (Signed)
[facility] Group Notes:  (Clinical Social Work)  10/27/2015  11:15-12:00PM  Summary of Progress/Problems:   Today's process group involved patients discussing their feelings related to being hospitalized, as well as how they can create a plan for how to stay out of the hospital in the future.  Much of that discussion involved staying on medications and keeping doctor appointments, and what barriers need to be overcome in order to accomplish those things.  The patient expressed her primary feeling about being hospitalized is "I'm here, that's all."  She stated she is going to start a new medication for her depression and that is going to extend her stay by about 2 days.  She acknowledged that she does not feel any differently so far on current meds, and that she is not sure she will recognize what feeling better feels like.  Type of Therapy:  Group Therapy - Process  Participation Level:  Minimal  Participation Quality:  Attentive  Affect:  Depressed and Flat  Cognitive:  Appropriate  Insight:  Developing/Improving  Engagement in Therapy:  Developing/Improving  Modes of Intervention:  Exploration, Discussion  Selmer Dominion, LCSW 10/27/2015, 12:31 PM

## 2015-10-28 LAB — GLUCOSE, CAPILLARY
GLUCOSE-CAPILLARY: 121 mg/dL — AB (ref 65–99)
GLUCOSE-CAPILLARY: 117 mg/dL — AB (ref 65–99)
GLUCOSE-CAPILLARY: 123 mg/dL — AB (ref 65–99)
Glucose-Capillary: 139 mg/dL — ABNORMAL HIGH (ref 65–99)

## 2015-10-28 MED ORDER — ONDANSETRON 4 MG PO TBDP: 4.0000 mg | ORAL_TABLET | Freq: Four times a day (QID) | ORAL | Status: DC | PRN

## 2015-10-28 MED ORDER — FLUOXETINE HCL 10 MG PO CAPS
ORAL_CAPSULE | ORAL | Status: AC
  Filled 2015-10-28: qty 1

## 2015-10-28 MED ORDER — ONDANSETRON 4 MG PO TBDP
ORAL_TABLET | ORAL | Status: AC
Start: 2015-10-28 — End: 2015-10-28
  Filled 2015-10-28: qty 1

## 2015-10-28 MED ORDER — FLUOXETINE HCL 10 MG PO CAPS
10.0000 mg | ORAL_CAPSULE | Freq: Every day | ORAL | Status: DC
Start: 2015-10-28 — End: 2015-10-29
  Administered 2015-10-28 – 2015-10-29 (×2): 10 mg via ORAL
  Filled 2015-10-28 (×2): qty 1
  Filled 2015-10-28: qty 7

## 2015-10-28 NOTE — BHH Group Notes (Signed)
[facility] Group Notes: (Clinical Social Work)  10/28/2015 11:00AM-12:00PM  Summary of Progress/Problems: The main focus of today's process group was to listen to a variety of genres of music and to identify that gospel music helps her.  She knew most of the music and sang along a lot.    Type of Therapy: Music Therapy   Participation Level: Active  Participation Quality: Attentive   Affect: Appropriate  Cognitive: Oriented  Insight: Engaged  Engagement in Therapy: Engaged  Modes of Intervention: Activity, Exploration  Selmer Dominion, LCSW 10/28/2015

## 2015-10-28 NOTE — Progress Notes (Signed)
D: Pt is flat and withdrawn to room; was in bed all evening. Pt endorses severe depression and severe anxiety.  Pt at the time of assessment denied any auditory or visual hallucinations; states, "I heard the some voices earlier in the day." Pt contacted for safety. Pt endorses moderate neuropathic pain on both feet. Pt remained guarded but cooperative. A: Medications offered as prescribed.  Support, encouragement, and safe environment provided.  15-minute safety checks continue. R: Pt was med compliant.  Pt attended wrap-up group. Safety checks continue.

## 2015-10-28 NOTE — Progress Notes (Signed)
Patient ID: Maureen Swanson, female   DOB: [birth year], 50 y.o.   MRN: [id number]   D: Pt has been very flat and depressed on the unit today. Pt reported this morning that she was nauseated, new orders noted to give patient Zofran. Pt reported that the Zofran helped. Pt also reported this morning that she woke up at 4am wanting to hurt herself. Pt reported at time of assessment that she was still having racing thoughts, but could contract for safety. Pt did attend all groups once she was up, and she engaged in treatment. Pt reported that her depression was a 8, her hopelessness was a 8, and her anxiety was a 8. Pt reported that her goal for today was to start setting goals to talk to staff about depression. Pt reported being negative HI, no AH/VH noted. A: 15 min checks continued for patient safety. R: Pt safety maintained.

## 2015-10-28 NOTE — Progress Notes (Signed)
[facility] MD Progress Note  10/28/2015 9:33 AM Maureen Swanson  MRN:  [id number] Subjective:  Patient reports " I was having thoughts of hurting myself last night. It started around 4am, and every time I had a thought someone would come into the room, and I was able to handle it on my own. I have a hard time telling someone when I having these thoughts. So I just stayed awake and kept tossing and turning. It seems like the closer I get to knowing I have to leave, it bothers me. '  Objective: Maureen Swanson is a  50 y.o. AA female , who has a hx of schizoaffective do, cocaine, alcohol, cannabis abuse, who voluntarily presents to [facility] with c/o SI w/ a plan and command AH. Maureen Swanson is well known to the [facility] unit. Maureen Swanson today continues to be depressed , seen in her wheel chair but able to ambulate on the unit, continues to have active SI on and off but is able to contract for safety. She is attending groups and her goals today is coping skills for self-harm. She is encouraged to develop some things to do instead of laying in the bed. Pt is compliant with medications at this time, and reports a good night sleep last night until she woke up at 4. Pt continues to ruminate about her stressors , continues to need a lot of encouragement. Maureen Swanson continues to want to get help with her substance abuse and is motivated to go to a substance abuse treatment program. She is very discouraged that she may not have a place to go upon discharge and this is increasing her depression. SHe currently rates her depression 8/10, anxiety 6/10, and hopelessness 8/10, with 0 being the least and 10 being the worse.    Principal Problem: Schizoaffective disorder, depressive type ([place]) Diagnosis:   Patient Active Problem List   Diagnosis Date Noted  . Schizoaffective disorder, depressive type ([place]) [F25.1] 10/19/2015  . Homicidal ideation [R45.850]   . Tobacco use disorder [F17.200] 07/20/2015  . Diabetes mellitus ([place]) [E11.9] 07/20/2015  .  Suicidal ideation [R45.851]   . Cocaine use disorder, moderate, dependence ([place]) [F14.20] 04/03/2015  . Schizoaffective disorder, bipolar type ([place]) [F25.0] 04/03/2015  . Alcohol use disorder, moderate, dependence ([place]) [F10.20] 04/03/2015  . Acute ischemic stroke ([place]) [I63.9] 12/08/2014  . Left-sided weakness [M62.89] 12/08/2014  . 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 Knee pain [M25.569] 10/17/2010  . MAMMOGRAM, ABNORMAL, RIGHT [R92.8] 08/05/2010  . Dyslipidemia [E78.5] 06/21/2010  . AMENORRHEA, SECONDARY [N91.2] 10/29/2009  . 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 GERD [K21.9] 10/11/2007  . Essential hypertension [I10] 02/26/2007   Total Time spent with patient: 25 minutes  Past Psychiatric History: as per H&P.  Past Medical History:  Past Medical History  Diagnosis Date  . High cholesterol   . Depression   . Gout   . Anxiety   . Bipolar 1 disorder ([place])   . GERD (gastroesophageal reflux disease)   . Substance abuse     crack cocaine  . Headache(784.0)     migraines  . TMJ (temporomandibular joint disorder)   . Asthma     daily and prn inhalers  . Arthritis     back and knees  . Gastric ulcer   . 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 Hypertension     under control, has been on med. x 12 yrs.  . Diabetes mellitus  diet-controlled  . CHF (congestive heart failure) [facility])     Past Surgical History  Procedure Laterality Date  . Knee arthroscopy w/ partial medial meniscectomy  05/01/2010    right  . Breast lumpectomy with needle localization  04/19/2012    Procedure: BREAST LUMPECTOMY WITH NEEDLE LOCALIZATION;  Surgeon: [name], MD;  Location: [place];  Service: General;  Laterality: Right;   Family History:  Family History  Problem Relation Age of Onset  . Diabetes Mother   . Breast cancer Mother   . Cancer Father   . Bipolar disorder Maternal Aunt   . Schizophrenia Maternal Grandfather   .  Alcoholism Maternal Uncle    Family Psychiatric  History: See Above Social History: As per H&P. History  Alcohol Use  . 0.0 oz/week    Comment: reports she drinks 3 40 ounce beers daily     History  Drug Use  . Yes  . Special: Cocaine    Comment: 09/28/2015    Social History   Social History  . Marital Status: Single    Spouse Name: N/A  . Number of Children: N/A  . Years of Education: N/A   Social History Main Topics  . Smoking status: Current Every Day Smoker -- 0.50 packs/day for 15 years    Types: Cigarettes    Last Attempt to Quit: 08/02/2012  . Smokeless tobacco: Never Used  . Alcohol Use: 0.0 oz/week     Comment: reports she drinks 3 40 ounce beers daily  . Drug Use: Yes    Special: Cocaine     Comment: 09/28/2015  . Sexual Activity: No   Other Topics Concern  . None   Social History Narrative   Additional Social History:     Sleep: Good   Appetite:  Poor  Current Medications: Current Facility-Administered Medications  Medication Dose Route Frequency Provider Last Rate Last Dose  . acetaminophen (TYLENOL) tablet 650 mg  650 mg Oral Q6H PRN Patrecia Pour, NP   650 mg at 10/27/15 1703  . albuterol (PROVENTIL HFA;VENTOLIN HFA) 108 (90 Base) MCG/ACT inhaler 2 puff  2 puff Inhalation Q6H PRN Patrecia Pour, NP      . alum & mag hydroxide-simeth (MAALOX/MYLANTA) 200-200-20 MG/5ML suspension 30 mL  30 mL Oral Q4H PRN Patrecia Pour, NP      . amLODipine (NORVASC) tablet 5 mg  5 mg Oral Daily Patrecia Pour, NP   5 mg at 10/27/15 1100  . ARIPiprazole (ABILIFY) tablet 5 mg  5 mg Oral Daily Maureen Eappen, MD   5 mg at 10/27/15 1100  . ARIPiprazole SUSR 400 mg  400 mg Intramuscular Q28 days [name], MD   400 mg at 10/22/15 1152  . aspirin chewable tablet 81 mg  81 mg Oral Daily Patrecia Pour, NP   81 mg at 10/27/15 1101  . atenolol (TENORMIN) tablet 100 mg  100 mg Oral Daily Patrecia Pour, NP   100 mg at 10/27/15 1100  . atorvastatin (LIPITOR) tablet 40  mg  40 mg Oral QPM Patrecia Pour, NP   40 mg at 10/27/15 1703  . baclofen (LIORESAL) tablet 10 mg  10 mg Oral TID Patrecia Pour, NP   10 mg at 10/27/15 1703  . calcium carbonate (OS-CAL - dosed in mg of elemental calcium) tablet 500 mg of elemental calcium  1 tablet Oral Q breakfast Patrecia Pour, NP   500 mg of elemental calcium  at 10/27/15 1100  . celecoxib (CELEBREX) capsule 100 mg  100 mg Oral BID Patrecia Pour, NP   100 mg at 10/27/15 1704  . feeding supplement (ENSURE ENLIVE) (ENSURE ENLIVE) liquid 237 mL  237 mL Oral BID BM Nanci Pina, FNP   237 mL at 10/27/15 1335  . furosemide (LASIX) tablet 40 mg  40 mg Oral Daily Patrecia Pour, NP   40 mg at 10/27/15 1059  . gabapentin (NEURONTIN) capsule 400 mg  400 mg Oral BID Patrecia Pour, NP   400 mg at 10/27/15 1703  . guaiFENesin (ROBITUSSIN) 100 MG/5ML solution 100 mg  5 mL Oral Q4H PRN [name], MD   100 mg at 10/27/15 1334  . hydrOXYzine (ATARAX/VISTARIL) tablet 25 mg  25 mg Oral Q6H PRN [name], MD   25 mg at 10/25/15 1156  . indomethacin (INDOCIN) capsule 25 mg  25 mg Oral TID PRN Patrecia Pour, NP   25 mg at 10/22/15 1838  . insulin glargine (LANTUS) injection 30 Units  30 Units Subcutaneous QHS Kerrie Buffalo, NP   30 Units at 10/27/15 2153  . linagliptin (TRADJENTA) tablet 5 mg  5 mg Oral Daily Patrecia Pour, NP   5 mg at 10/27/15 1100  . loratadine (CLARITIN) tablet 10 mg  10 mg Oral Daily Patrecia Pour, NP   10 mg at 10/27/15 1101  . losartan (COZAAR) tablet 25 mg  25 mg Oral BID Patrecia Pour, NP   25 mg at 10/27/15 1703  . magnesium hydroxide (MILK OF MAGNESIA) suspension 30 mL  30 mL Oral Daily PRN Patrecia Pour, NP      . menthol-cetylpyridinium (CEPACOL) lozenge 3 mg  1 lozenge Oral PRN [name], MD   3 mg at 10/25/15 1545  . metFORMIN (GLUCOPHAGE) tablet 1,000 mg  1,000 mg Oral BID WC Patrecia Pour, NP   1,000 mg at 10/27/15 1703  . multivitamin with minerals tablet 1 tablet  1 tablet Oral Daily  Nicholaus Bloom, MD   1 tablet at 10/27/15 1100  . nicotine (NICODERM CQ - dosed in mg/24 hours) patch 21 mg  21 mg Transdermal Daily Derrill Center, NP   21 mg at 10/27/15 1059  . ondansetron (ZOFRAN-ODT) disintegrating tablet 4 mg  4 mg Oral Q6H PRN Nanci Pina, FNP      . Oxcarbazepine (TRILEPTAL) tablet 300 mg  300 mg Oral Daily Maureen Eappen, MD   300 mg at 10/27/15 1059  . Oxcarbazepine (TRILEPTAL) tablet 600 mg  600 mg Oral QPM Maureen Eappen, MD   600 mg at 10/27/15 1703  . QUEtiapine (SEROQUEL) tablet 500 mg  500 mg Oral QHS [name], MD   500 mg at 10/27/15 2110  . thiamine (VITAMIN B-1) tablet 100 mg  100 mg Oral Daily Nicholaus Bloom, MD   100 mg at 10/27/15 1100  . traZODone (DESYREL) tablet 200 mg  200 mg Oral QHS Derrill Center, NP   200 mg at 10/27/15 2110    Lab Results:  Results for orders placed or performed during the hospital encounter of 10/19/15 (from the past 48 hour(s))  Glucose, capillary     Status: Abnormal   Collection Time: 10/26/15 11:42 AM  Result Value Ref Range   Glucose-Capillary 120 (H) 65 - 99 mg/dL   Comment 1 Notify RN    Comment 2 Document in Chart   Glucose, capillary     Status: Abnormal  Collection Time: 10/26/15  5:10 PM  Result Value Ref Range   Glucose-Capillary 117 (H) 65 - 99 mg/dL   Comment 1 Notify RN    Comment 2 Document in Chart   Glucose, capillary     Status: Abnormal   Collection Time: 10/26/15  8:20 PM  Result Value Ref Range   Glucose-Capillary 142 (H) 65 - 99 mg/dL  Glucose, capillary     Status: None   Collection Time: 10/27/15  5:48 AM  Result Value Ref Range   Glucose-Capillary 90 65 - 99 mg/dL  Glucose, capillary     Status: Abnormal   Collection Time: 10/27/15 12:08 PM  Result Value Ref Range   Glucose-Capillary 124 (H) 65 - 99 mg/dL  Glucose, capillary     Status: Abnormal   Collection Time: 10/27/15  5:01 PM  Result Value Ref Range   Glucose-Capillary 169 (H) 65 - 99 mg/dL  Glucose, capillary     Status:  Abnormal   Collection Time: 10/27/15  8:31 PM  Result Value Ref Range   Glucose-Capillary 173 (H) 65 - 99 mg/dL  Glucose, capillary     Status: Abnormal   Collection Time: 10/28/15  6:21 AM  Result Value Ref Range   Glucose-Capillary 121 (H) 65 - 99 mg/dL    Blood Alcohol level:  Lab Results  Component Value Date   ETH <5 10/18/2015   ETH <5 08/08/2015    Physical Findings: AIMS: Facial and Oral Movements Muscles of Facial Expression: None, normal Lips and Perioral Area: None, normal Jaw: None, normal Tongue: None, normal,Extremity Movements Upper (arms, wrists, hands, fingers): None, normal Lower (legs, knees, ankles, toes): None, normal, Trunk Movements Neck, shoulders, hips: None, normal, Overall Severity Severity of abnormal movements (highest score from questions above): None, normal Incapacitation due to abnormal movements: None, normal Patient's awareness of abnormal movements (rate only patient's report): No Awareness, Dental Status Current problems with teeth and/or dentures?: No Does patient usually wear dentures?: No  CIWA:  CIWA-Ar Total: 4 COWS:     Musculoskeletal: Strength & Muscle Tone: within normal limits Gait & Station: normal Patient leans: N/A  Psychiatric Specialty Exam: Review of Systems  Psychiatric/Behavioral: Positive for depression, suicidal ideas, hallucinations and substance abuse. The patient is nervous/anxious and has insomnia.   All other systems reviewed and are negative.   Blood pressure 121/89, pulse 80, temperature 98.4 F (36.9 C), temperature source Oral, resp. rate 20, height 5' 3.78" (1.62 m), weight 96.163 kg (212 lb), SpO2 98 %.Body mass index is 36.64 kg/(m^2).  General Appearance: Fairly Groomed and in paper scrubs  Eye Contact::  Fair  Speech:  Clear and Coherent  Volume:  Normal  Mood:  Anxious, Depressed and Hopeless  Affect:  Congruent, Depressed and Flat  Thought Process:  Coherent  Orientation:  Full (Time, Place,  and Person)  Thought Content:  Rumination improving  Suicidal Thoughts:  Yes.  without intent/plan active,   Homicidal Thoughts:  No  Memory:  Immediate;   Fair Recent;   Fair Remote;   Fair  Judgement:  Fair  Insight:  Present  Psychomotor Activity:  Restlessness  Concentration:  Fair  Recall:  [employer] of Knowledge:Fair  Language: Fair  Akathisia:  No  Handed:  Right  AIMS (if indicated):     Assets:  Communication Skills Desire for Improvement Resilience  ADL's:  Intact  Cognition: WNL  Sleep:  Number of Hours: 6.75     Treatment Plan Summary:Reve Jerilynn Mages Hammen is a  50 y.o.AA female , who has a hx of schizoaffective do , cocaine, alcohol, cannabis abuse, who voluntarily presents to [facility] with c/o SI w/ a plan and command AH. Pt continues to be depressed and has SI. Will continue treatment.  Daily contact with patient to assess and evaluate symptoms and progress in treatment and Medication management   Continue CIWA/Ativan protocol for alcohol withdrawal sx. Increased Trileptal to 300 mg po daily and 600 mg po qpm for mood stabilization, Continue Vistaril 25 mg every six hours PRN anxiety,  continue Gabapentin 400 mg BID for anxiety/withdrawal Continue  Abilify 5 mg daily for psychosis.  Abilify Maintena IM 400 mg x 1 dose  10/22/15- repeat q28 days. Will continue  seroquel to 500 mg po qhs for psychosis/augmenting the effect of Abilify . Will continue Trazodone 200 mg PO QHS for insomnia. Will start Prozac 10mg  po daily, with plans to increase 10/30/2015 to Prozac 20mg  po daily.  Will start Ensure Enlive BID between meals to help with decrease appetite.  Restarted home medications where indicated. -Individual and substance abuse counseling CSW will continue to work on disposition - pt  referred to substance abuse program , awaiting placement - she is very motivated to do so.  Nanci Pina, FNP 10/28/2015, 9:33 AM I agreed with findings and treatment plan of this patient

## 2015-10-29 LAB — GLUCOSE, CAPILLARY
Glucose-Capillary: 103 mg/dL — ABNORMAL HIGH (ref 65–99)
Glucose-Capillary: 113 mg/dL — ABNORMAL HIGH (ref 65–99)

## 2015-10-29 MED ORDER — ASPIRIN 81 MG PO CHEW: 81.0000 mg | CHEWABLE_TABLET | Freq: Every day | ORAL | Status: DC

## 2015-10-29 MED ORDER — QUETIAPINE FUMARATE 100 MG PO TABS: 500.0000 mg | ORAL_TABLET | Freq: Every day | ORAL | Status: DC

## 2015-10-29 MED ORDER — HYDROXYZINE HCL 25 MG PO TABS: 25.0000 mg | ORAL_TABLET | Freq: Four times a day (QID) | ORAL | Status: DC | PRN

## 2015-10-29 MED ORDER — AMLODIPINE BESYLATE 5 MG PO TABS: 5.0000 mg | ORAL_TABLET | Freq: Every day | ORAL | Status: DC

## 2015-10-29 MED ORDER — ATORVASTATIN CALCIUM 40 MG PO TABS: 40.0000 mg | ORAL_TABLET | Freq: Every evening | ORAL | Status: DC

## 2015-10-29 MED ORDER — FLUOXETINE HCL 10 MG PO CAPS: 10.0000 mg | ORAL_CAPSULE | Freq: Every day | ORAL | Status: DC

## 2015-10-29 MED ORDER — METFORMIN HCL 1000 MG PO TABS: 1000.0000 mg | ORAL_TABLET | Freq: Two times a day (BID) | ORAL | Status: DC

## 2015-10-29 MED ORDER — BACLOFEN 10 MG PO TABS: 10.0000 mg | ORAL_TABLET | Freq: Three times a day (TID) | ORAL | Status: DC

## 2015-10-29 MED ORDER — LOSARTAN POTASSIUM 25 MG PO TABS: 25.0000 mg | ORAL_TABLET | Freq: Two times a day (BID) | ORAL | Status: DC

## 2015-10-29 MED ORDER — ARIPIPRAZOLE 5 MG PO TABS: 5.0000 mg | ORAL_TABLET | Freq: Every day | ORAL | Status: DC

## 2015-10-29 MED ORDER — ATENOLOL 100 MG PO TABS: 100.0000 mg | ORAL_TABLET | Freq: Every day | ORAL | Status: DC

## 2015-10-29 MED ORDER — INSULIN GLARGINE 100 UNIT/ML ~~LOC~~ SOLN: 30.0000 [IU] | Freq: Every day | SUBCUTANEOUS | Status: DC

## 2015-10-29 MED ORDER — GABAPENTIN 400 MG PO CAPS: 400.0000 mg | ORAL_CAPSULE | Freq: Two times a day (BID) | ORAL | Status: DC

## 2015-10-29 MED ORDER — ARIPIPRAZOLE ER 400 MG IM SUSR: 400.0000 mg | INTRAMUSCULAR | Status: DC

## 2015-10-29 MED ORDER — TRAZODONE HCL 100 MG PO TABS: 200.0000 mg | ORAL_TABLET | Freq: Every day | ORAL | Status: DC

## 2015-10-29 MED ORDER — OXCARBAZEPINE 600 MG PO TABS: 600.0000 mg | ORAL_TABLET | Freq: Every evening | ORAL | Status: DC

## 2015-10-29 MED ORDER — CELECOXIB 100 MG PO CAPS: 100.0000 mg | ORAL_CAPSULE | Freq: Two times a day (BID) | ORAL | Status: DC

## 2015-10-29 MED ORDER — LINAGLIPTIN 5 MG PO TABS: 5.0000 mg | ORAL_TABLET | Freq: Every day | ORAL | Status: DC

## 2015-10-29 NOTE — Progress Notes (Signed)
  [facility] Adult Case Management Discharge Plan :  Will you be returning to the same living situation after discharge:  Yes,  home At discharge, do you have transportation home?: Yes,  bus passes Do you have the ability to pay for your medications: Yes,  mental health  Release of information consent forms completed and in the chart;  Patient's signature needed at discharge.  Patient to Follow up at: Follow-up Information    Follow up with [facility].   Why:  Resume services here if ACT team does not work out for you.   Contact information:   [id number]      Follow up with [facility].   Why:  Call Crystal tomorrow [Tuesday] to follow up on referral Rod faxed over to them   Contact information:   [phone]      Follow up with [facility].   Why:  Call [facility] tomorrow to follow up on ACT referral that Rod faxed to them   Contact information:   [phone]      Next level of care provider has access to [place] and Suicide Prevention discussed: Yes,  yes  Have you used any form of tobacco in the last 30 days? (Cigarettes, Smokeless Tobacco, Cigars, and/or Pipes): Yes  Has patient been referred to the Quitline?: Patient refused referral  Patient has been referred for addiction treatment: Yes  [name] 10/29/2015, 10:38 AM

## 2015-10-29 NOTE — Tx Team (Signed)
Interdisciplinary Treatment Plan Update (Adult)  Date:  10/29/2015   Time Reviewed:  10:32 AM   Progress in Treatment: Attending groups: Yes. Participating in groups:  Yes. Taking medication as prescribed:  Yes. Tolerating medication:  Yes. Family/Significant other contact made:  No Patient understands diagnosis:  Yes  As evidenced by seeking help with her usual complaints Discussing patient identified problems/goals with staff:  Yes, see initial care plan. Medical problems stabilized or resolved:  Yes. Denies suicidal/homicidal ideation: Yes. Issues/concerns per patient self-inventory:  No. Other:  New problem(s) identified:  Discharge Plan or Barriers: see below  Reason for Continuation of Hospitalization:   Comments:  Per [employer], pt reported SI, HI, auditory hallucinations, with plan to cut her wrist, pt did not have access to knife. Pt has hx of similar attempts. Calm, cooperative. Pt drinks about 6 tall beers per day, last drink yesterday. Reports using cocaine. Voices tell her to hurt other people and to jump in front of moving vehicles. Continue CIWA/Ativan protocol for alcohol withdrawal sx. Continue Trileptal 300 mg BID for mood stabilization, Continue Vistaril 25 mg every six hours PRN anxiety,  continue Gabapentin 400 mg BID for anxiety/withdrawal Continue Abilify 5 mg daily for psychosis. Will provide Abilify Maintena IM 400 mg x 1 dose today 10/22/15- repeat q28 days. Will restart seroquel 400 mg po qhs for psychosis/augmenting the effect of Abilify . Will continue Trazodone 200 mg PO QHS for insomnia.  10/25/15: Continue CIWA/Ativan protocol for alcohol withdrawal sx. Increased Trileptal to 300 mg po daily and 600 mg po qpm for mood stabilization, Continue Vistaril 25 mg every six hours PRN anxiety,  continue Gabapentin 400 mg BID for anxiety/withdrawal Continue Abilify 5 mg daily for psychosis. Abilify Maintena IM 400 mg x 1 dose 10/22/15- repeat q28 days. Will  continue seroquel to 500 mg po qhs for psychosis/augmenting the effect of Abilify . Will continue Trazodone 200 mg PO QHS for insomnia.  Estimated length of stay: D/C today  New goal(s):  Review of initial/current patient goals per problem list:   Review of initial/current patient goals per problem list:  1. Goal(s): Patient will participate in aftercare plan   Met: No   Target date: 3-5 days post admission date   As evidenced by: Patient will participate within aftercare plan AEB aftercare provider and housing plan at discharge being identified. 10/22/15:  Pt is asking for referral to  [facility].  Also given [facility] on [facility] programs in [place] and [place] 10/25/15:  [facility]a Pester rejected her based on medical issues, mental health issues.  Decided she would not [facility] to the [facility] "because that is a work program, and it would jeopardize my disability application." Likely d/c home, follow up [facility] 10/29/15:  Asking for referral to ACT team.  She signed 2 releases, and was told to follow up with them post d/c.  CSW will FAX information to both today.  Says she plans to check in with [facility] about shelter, and after that will go to stay with boyfriend or son.   2. Goal (s): Patient will exhibit decreased depressive symptoms and suicidal ideations.   Met: Yes   Target date: 3-5 days post admission date   As evidenced by: Patient will utilize self rating of depression at 3 or below and demonstrate decreased signs of depression or be deemed stable for discharge by MD. 10/22/15:  Rates depression a 10 today 10/25/15:  Rates depression an 8 today, passive SI 10/29/15:  Rates depression a 5 today.  Denies SI, States her depression is "manageable"     4. Goal(s): Patient will demonstrate decreased signs of withdrawal due to substance abuse   Met: Yes   Target date: 3-5 days post admission date   As evidenced by: Patient will produce a CIWA/COWS score of 0, have  stable vitals signs, and no symptoms of withdrawal 10/22/15:  No signs nor symptoms of withdrawal today     5. Goal(s): Patient will demonstrate decreased signs of psychosis  * Met: Yes  * Target date: 3-5 days post admission date  * As evidenced by: Patient will demonstrate decreased frequency of AVH or return to baseline function 10/22/15:  C/O AH 10/25/15:  Denies AH today           Attendees: Patient:  10/29/2015 10:32 AM   Family:   10/29/2015 10:32 AM   Physician:  [name], MD 10/29/2015 10:32 AM   Nursing:   Manuella Ghazi, RN 10/29/2015 10:32 AM   CSW:    [name], LCSW   10/29/2015 10:32 AM   Other:  10/29/2015 10:32 AM   Other:   10/29/2015 10:32 AM   Other:  Lars Pinks, Nurse CM 10/29/2015 10:32 AM   Other:   10/29/2015 10:32 AM   Other:  Norberto Sorenson, [place]  10/29/2015 10:32 AM   Other:  10/29/2015 10:32 AM   Other:  10/29/2015 10:32 AM   Other:  10/29/2015 10:32 AM   Other:  10/29/2015 10:32 AM   Other:  10/29/2015 10:32 AM   Other:   10/29/2015 10:32 AM    Scribe for Treatment Team:   [name], 10/29/2015 10:32 AM

## 2015-10-29 NOTE — Progress Notes (Signed)
D. Pt has been up and visible in the milieu this evening, minimal interaction or participation. Pt has appeared flat and depressed today. Pt did complain of on-going racing thoughts and did mention about feeling of anxiety during the day today. Pt did attend evening group activity and did receive all bedtime medications without incident. A. Support and encouragement provided. R. Safety maintained,will continue to monitor.

## 2015-10-29 NOTE — Progress Notes (Signed)
Patient resting in bed however did come up for AM meds with encouragement. She is flat, mood depressed. Rates depression at a 7/10, hopelessness and anxiety both at an 8/10. Asks, "am I on the discharge list?" "I'm hearing voices to hurt myself and others." Patient acknowledges that this could be due to the possibility of discharge. Denies plan, intent. Patient complaining of bilat foot pain of a 9/10. Ambulating via her wheelchair. Medicated per orders, tylenol given for pain. Emotional support offered. Self inventory reviewed. CM made aware of patient's status today. Fall precautions reviewed and patient verbalizes understanding. Patient verbally contracts for safety. She remains safe on level III obs.

## 2015-10-29 NOTE — BHH Suicide Risk Assessment (Addendum)
[facility] Discharge Suicide Risk Assessment   Principal Problem: Schizoaffective disorder, depressive type [facility]) Discharge Diagnoses:  Patient Active Problem List   Diagnosis Date Noted  . Schizoaffective disorder, depressive type ([place]) [F25.1] 10/19/2015  . Homicidal ideation [R45.850]   . Tobacco use disorder [F17.200] 07/20/2015  . Diabetes mellitus ([place]) [E11.9] 07/20/2015  . Suicidal ideation [R45.851]   . Cocaine use disorder, moderate, dependence ([place]) [F14.20] 04/03/2015  . Schizoaffective disorder, bipolar type ([place]) [F25.0] 04/03/2015  . Alcohol use disorder, moderate, dependence ([place]) [F10.20] 04/03/2015  . Acute ischemic stroke ([place]) [I63.9] 12/08/2014  . Left-sided weakness [M62.89] 12/08/2014  . 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 Knee pain [M25.569] 10/17/2010  . MAMMOGRAM, ABNORMAL, RIGHT [R92.8] 08/05/2010  . Dyslipidemia [E78.5] 06/21/2010  . AMENORRHEA, SECONDARY [N91.2] 10/29/2009  . 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 GERD [K21.9] 10/11/2007  . Essential hypertension [I10] 02/26/2007    Total Time spent with patient: 30 minutes  Musculoskeletal: Strength & Muscle Tone: within normal limits Gait & Station: states has some pain on ambulation related to tendintis Patient leans: N/A  Psychiatric Specialty Exam: ROS  Blood pressure 139/93, pulse 83, temperature 98.7 F (37.1 C), temperature source Oral, resp. rate 15, height 5' 3.78" (1.62 m), weight 212 lb (96.163 kg), SpO2 98 %.Body mass index is 36.64 kg/(m^2).  General Appearance: Fairly Groomed  Engineer, water::  Good  Speech:  Normal Rate409  Volume:  Normal  Mood:  States her mood is better   Affect:  Blunted   Thought Process:  Linear  Orientation:  Other:  fully alert and attentive   Thought Content:  Has reported hallucinations , but states that at this time not experiencing any, and no delusions expressed. Does not appear internally preoccupied at this time .   Suicidal Thoughts:   No- denies any suicidal or self injurious ideations, also denies any homicidal or violent ideations   Homicidal Thoughts:  No  Memory:  recent and remote grossly intact   Judgement:  Improving   Insight:  Improving   Psychomotor Activity:  Normal  Concentration:  Good  Recall:  Good  Fund of Knowledge:Good  Language: Good  Akathisia:  Negative  Handed:  Right  AIMS (if indicated):     Assets:  Communication Skills Desire for Improvement Resilience  Sleep:  Number of Hours: 6.5  Cognition: WNL  ADL's:  Intact   Mental Status Per Nursing Assessment::   On Admission:  Suicidal ideation indicated by patient, Self-harm thoughts  Demographic Factors:  50 year old female , currently homeless   Loss Factors: Limited support network   Historical Factors: Has been diagnosed with Schizoaffective Disorder in the past, has history of cocaine and alcohol abuse. History of prior psychiatric admissions   Risk Reduction Factors:   Positive coping skills or problem solving skills  Continued Clinical Symptoms:  I have reviewed case with treatment team, CSW  Patient reports some improvement and is denying any SI or active halls at this time. She feels less apprehensive and readier for discharge at this time . At this time patient alert, attentive, calm, states she feels " ready to discharge". Reports mood as improved, and at this time denies severe depression, affect blunted, but does smile briefly /appropriately at times, no thought disorder at present, denies current suicidal ideations, denies any homicidal ideations, states hallucinations have resolved and at this time does not appear internally preoccupied nor does she express any delusional ideations .  Future oriented, and plans  to work with ACT team.  At this time does not endorse medication side effects  Cognitive Features That Contribute To Risk:  No gross cognitive deficits noted upon discharge. Is alert , attentive, and oriented x 3    Suicide Risk:  Mild:  Suicidal ideation of limited frequency, intensity, duration, and specificity.  There are no identifiable plans, no associated intent, mild dysphoria and related symptoms, good self-control (both objective and subjective assessment), few other risk factors, and identifiable protective factors, including available and accessible social support.  Follow-up Information    Follow up with [facility] of the [place].   Why:  Resume services here if ACT team does not work out for you.   Contact information:   [phone]      Follow up with [facility].   Why:  Call Crystal tomorrow [Tuesday] to follow up on referral Rod faxed over to them   Contact information:   [phone]      Follow up with [facility].   Why:  Call Eddie Candle or Delores tomorrow to follow up on CST referral that Rod faxed to them   Contact information:   [[phone]] [place] [facility]/Follow-up recommendations:  Activity:  as tolerated  Diet:  Diabetic Diet , Heart Healthy Tests:  NA Other:  see below  Follow up  as above  Plans to go to [facility], shelter system  Encouraged to remain compliant with medications and maintain focus on sobriety, relapse prevention efforts . Neita Garnet, MD 10/29/2015, 1:38 PM

## 2015-10-29 NOTE — Progress Notes (Signed)
Patient verbalizes readiness for discharge. Was initially reluctant however states she is ready to discharge. When asked about SI/HI/AVH she states, "no, not right now. I was having them earlier I think because I'm nervous about leaving. I will be okay." Follow up plan explained, Rx's given along with sample medications. All belongings returned. She verbalizes understanding and states she will call us, seek help should she become unsafe. Agrees to med compliance. Assures this Probation officer she will follow up with ACT team referrals. Patient discharged ambulatory and in stable condition with peer who is providing transport to [facility].

## 2015-10-29 NOTE — BHH Suicide Risk Assessment (Signed)
[place] INPATIENT:  Family/Significant Other Suicide Prevention Education  Suicide Prevention Education:  Patient Refusal for Family/Significant Other Suicide Prevention Education: The patient Maureen Swanson has refused to provide written consent for family/significant other to be provided Family/Significant Other Suicide Prevention Education during admission and/or prior to discharge.  Physician notified.  [name] 10/29/2015, 10:35 AM

## 2015-10-29 NOTE — Discharge Summary (Signed)
Physician Discharge Summary Note  Patient:  Maureen Swanson is an 50 y.o., female MRN:  [id number] DOB:  Oct 13, 1965 Patient phone:  [phone] (home)  Patient address:   [address] [place] [address],  Total Time spent with patient: 30 minutes  Date of Admission:  10/19/2015 Date of Discharge: 10/29/2015   Reason for Admission:  psychosis  Principal Problem: Schizoaffective disorder, depressive type [facility]) Discharge Diagnoses: Patient Active Problem List   Diagnosis Date Noted  . Schizoaffective disorder, depressive type ([place]) [F25.1] 10/19/2015    Priority: High  . Schizoaffective disorder, bipolar type ([place]) [F25.0] 04/03/2015    Priority: High  . Homicidal ideation [R45.850]   . Tobacco use disorder [F17.200] 07/20/2015  . Diabetes mellitus ([place]) [E11.9] 07/20/2015  . Suicidal ideation [R45.851]   . Cocaine use disorder, moderate, dependence ([place]) [F14.20] 04/03/2015  . Alcohol use disorder, moderate, dependence ([place]) [F10.20] 04/03/2015  . Acute ischemic stroke ([place]) [I63.9] 12/08/2014  . Left-sided weakness [M62.89] 12/08/2014  . 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 Knee pain [M25.569] 10/17/2010  . MAMMOGRAM, ABNORMAL, RIGHT [R92.8] 08/05/2010  . Dyslipidemia [E78.5] 06/21/2010  . AMENORRHEA, SECONDARY [N91.2] 10/29/2009  . 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 GERD [K21.9] 10/11/2007  . Essential hypertension [I10] 02/26/2007    Past Psychiatric History: see above noted  Past Medical History:  Past Medical History  Diagnosis Date  . High cholesterol   . Depression   . Gout   . Anxiety   . Bipolar 1 disorder ([place])   . GERD (gastroesophageal reflux disease)   . Substance abuse     crack cocaine  . Headache(784.0)     migraines  . TMJ (temporomandibular joint disorder)   . Asthma     daily and prn inhalers  . Arthritis     back and knees  . Gastric ulcer   . 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  Hypertension     under control, has been on med. x 12 yrs.  . Diabetes mellitus     diet-controlled  . CHF (congestive heart failure) [facility])     Past Surgical History  Procedure Laterality Date  . Knee arthroscopy w/ partial medial meniscectomy  05/01/2010    right  . Breast lumpectomy with needle localization  04/19/2012    Procedure: BREAST LUMPECTOMY WITH NEEDLE LOCALIZATION;  Surgeon: [name], MD;  Location: [place];  Service: General;  Laterality: Right;   Family History:  Family History  Problem Relation Age of Onset  . Diabetes Mother   . Breast cancer Mother   . Cancer Father   . Bipolar disorder Maternal Aunt   . Schizophrenia Maternal Grandfather   . Alcoholism Maternal Uncle    Family Psychiatric  History:  See above noted Social History:  History  Alcohol Use  . 0.0 oz/week    Comment: reports Maureen Swanson drinks 3 40 ounce beers daily     History  Drug Use  . Yes  . Special: Cocaine    Comment: 09/28/2015    Social History   Social History  . Marital Status: Single    Spouse Name: N/A  . Number of Children: N/A  . Years of Education: N/A   Social History Main Topics  . Smoking status: Current Every Day Smoker -- 0.50 packs/day for 15 years    Types: Cigarettes    Last Attempt to Quit: 08/02/2012  . Smokeless tobacco:  Never Used  . Alcohol Use: 0.0 oz/week     Comment: reports Maureen Swanson drinks 3 40 ounce beers daily  . Drug Use: Yes    Special: Cocaine     Comment: 09/28/2015  . Sexual Activity: No   Other Topics Concern  . None   Social History Narrative    Hospital Course:    Maureen Swanson was admitted for Schizoaffective disorder, depressive type [facility]) and crisis management.  Maureen Swanson was treated with medications listed below.  Medical problems were identified and treated as needed.  Home medications were restarted as appropriate.  Improvement was monitored by observation and Maureen Swanson daily report of symptom reduction.  Emotional  and mental status was monitored by daily self inventory reports completed by Maureen Swanson and clinical staff.  Patient reported continued improvement, denied any new concerns.  Patient had been compliant on medications and denied side effects.  Support and encouragement was provided.    Patient did well during inpatient stay.  At time of discharge, patient rated both depression and anxiety levels to be manageable and minimal.  Patient was able to identify the triggers of emotional crises and de-stabilizations.  Patient identified the positive things in life that would help in dealing with feelings of loss, depression and unhealthy or abusive tendencies.         Maureen Swanson was evaluated by the treatment team for stability and plans for continued recovery upon discharge.  Maureen Swanson was offered further treatment options upon discharge including Residential, Intensive Outpatient and Outpatient treatment.  Maureen Swanson will follow up with agencies listed below for medication management and counseling.  Encouraged patient to maintain satisfactory support network and home environment.  Advised to adhere to medication compliance and outpatient treatment follow up.      Maureen Swanson motivation was an integral factor for scheduling further treatment.  Employment, transportation, bed availability, health status, family support, and any pending legal issues were also considered during her hospital stay.  Upon completion of this admission the patient was both mentally and medically stable for discharge denying suicidal/homicidal ideation, auditory/visual/tactile hallucinations, delusional thoughts and paranoia.      Physical Findings: AIMS: Facial and Oral Movements Muscles of Facial Expression: None, normal Lips and Perioral Area: None, normal Jaw: None, normal Tongue: None, normal,Extremity Movements Upper (arms, wrists, hands, fingers): None, normal Lower (legs, knees, ankles, toes): None, normal, Trunk Movements Neck,  shoulders, hips: None, normal, Overall Severity Severity of abnormal movements (highest score from questions above): None, normal Incapacitation due to abnormal movements: None, normal Patient's awareness of abnormal movements (rate only patient's report): No Awareness, Dental Status Current problems with teeth and/or dentures?: No Does patient usually wear dentures?: No  CIWA:  CIWA-Ar Total: 0 COWS:     Musculoskeletal: Strength & Muscle Tone: within normal limits Gait & Station: normal Patient leans: N/A  Psychiatric Specialty Exam:  See MD SRA Review of Systems  Psychiatric/Behavioral: Negative for suicidal ideas and hallucinations. The patient is not nervous/anxious.     Blood pressure 139/93, pulse 83, temperature 98.7 F (37.1 C), temperature source Oral, resp. rate 15, height 5' 3.78" (1.62 m), weight 96.163 kg (212 lb), SpO2 98 %.Body mass index is 36.64 kg/(m^2).  Have you used any form of tobacco in the last 30 days? (Cigarettes, Smokeless Tobacco, Cigars, and/or Pipes): Yes  Has this patient used any form of tobacco in the last 30 days? (Cigarettes, Smokeless Tobacco, Cigars, and/or Pipes) Yes, NA  Blood Alcohol level:  Lab Results  Component Value Date   [facility] <5 10/18/2015   ETH <5 [id number]    Metabolic Disorder Labs:  Lab Results  Component Value Date   HGBA1C 5.4 08/10/2015   MPG 120 08/07/2015   MPG 117 07/21/2015   Lab Results  Component Value Date   PROLACTIN 5.7 08/10/2015   PROLACTIN 141.3* 07/21/2015   Lab Results  Component Value Date   CHOL 159 08/10/2015   TRIG 122 08/10/2015   HDL 48 08/10/2015   CHOLHDL 3.3 08/10/2015   VLDL 24 08/10/2015   LDLCALC 87 08/10/2015   LDLCALC 63 07/21/2015    See Psychiatric Specialty Exam and Suicide Risk Assessment completed by Attending Physician prior to discharge.  Discharge destination:  Home  Is patient on multiple antipsychotic therapies at discharge:  No   Has Patient had three or more failed  trials of antipsychotic monotherapy by history:  No  Recommended Plan for Multiple Antipsychotic Therapies: NA     Medication List    STOP taking these medications        albuterol 108 (90 Base) MCG/ACT inhaler  Commonly known as:  PROVENTIL HFA;VENTOLIN HFA     aspirin 81 MG tablet  Replaced by:  aspirin 81 MG chewable tablet     azithromycin 250 MG tablet  Commonly known as:  ZITHROMAX Z-PAK     calcium carbonate 1250 (500 Ca) MG tablet  Commonly known as:  OS-CAL - dosed in mg of elemental calcium     cetirizine 10 MG tablet  Commonly known as:  ZYRTEC     DEXILANT 30 MG capsule  Generic drug:  Dexlansoprazole     furosemide 40 MG tablet  Commonly known as:  LASIX     glipiZIDE 10 MG tablet  Commonly known as:  GLUCOTROL     indomethacin 25 MG capsule  Commonly known as:  INDOCIN     ondansetron 4 MG tablet  Commonly known as:  ZOFRAN     pantoprazole 40 MG tablet  Commonly known as:  PROTONIX     QUEtiapine 400 MG 24 hr tablet  Commonly known as:  SEROQUEL XR  Replaced by:  QUEtiapine 100 MG tablet     sitaGLIPtin 100 MG tablet  Commonly known as:  JANUVIA  Replaced by:  linagliptin 5 MG Tabs tablet      TAKE these medications      Indication   amLODipine 5 MG tablet  Commonly known as:  NORVASC  Take 1 tablet (5 mg total) by mouth daily.   Indication:  High Blood Pressure     ARIPiprazole 5 MG tablet  Commonly known as:  ABILIFY  Take 1 tablet (5 mg total) by mouth daily.   Indication:  schizoaffective disorder     ARIPiprazole 400 MG Susr  Inject 400 mg into the muscle every 28 (twenty-eight) days. Last dose 5/8, next dose due 11/22/15   Indication:  Schizophrenia     aspirin 81 MG chewable tablet  Chew 1 tablet (81 mg total) by mouth daily.   Indication:  Joint Damage causing Pain and Loss of Function, cardiac health     atenolol 100 MG tablet  Commonly known as:  TENORMIN  Take 1 tablet (100 mg total) by mouth daily.   Indication:   High Blood Pressure     atorvastatin 40 MG tablet  Commonly known as:  LIPITOR  Take 1 tablet (40 mg total) by mouth every evening.   Indication:  Cerebrovascular Accident  or Stroke, Elevation of Both Cholesterol and Triglycerides in Blood     baclofen 10 MG tablet  Commonly known as:  LIORESAL  Take 1 tablet (10 mg total) by mouth 3 (three) times daily.      celecoxib 100 MG capsule  Commonly known as:  CELEBREX  Take 1 capsule (100 mg total) by mouth 2 (two) times daily.   Indication:  Joint Damage causing Pain and Loss of Function, cardiac health     FLUoxetine 10 MG capsule  Commonly known as:  PROZAC  Take 1 capsule (10 mg total) by mouth daily.   Indication:  Depression, Major Depressive Disorder     gabapentin 400 MG capsule  Commonly known as:  NEURONTIN  Take 1 capsule (400 mg total) by mouth 2 (two) times daily.   Indication:  Neuropathic Pain, mood stabilization and chronic pain     hydrOXYzine 25 MG tablet  Commonly known as:  ATARAX/VISTARIL  Take 1 tablet (25 mg total) by mouth every 6 (six) hours as needed for anxiety.   Indication:  Anxiety Neurosis     insulin glargine 100 UNIT/ML injection  Commonly known as:  LANTUS  Inject 0.3 mLs (30 Units total) into the skin at bedtime.   Indication:  Type 2 Diabetes     linagliptin 5 MG Tabs tablet  Commonly known as:  TRADJENTA  Take 1 tablet (5 mg total) by mouth daily.   Indication:  Type 2 Diabetes     losartan 25 MG tablet  Commonly known as:  COZAAR  Take 1 tablet (25 mg total) by mouth 2 (two) times daily.   Indication:  High Blood Pressure     metFORMIN 1000 MG tablet  Commonly known as:  GLUCOPHAGE  Take 1 tablet (1,000 mg total) by mouth 2 (two) times daily with a meal.   Indication:  Type 2 Diabetes     oxcarbazepine 600 MG tablet  Commonly known as:  TRILEPTAL  Take 1 tablet (600 mg total) by mouth every evening.   Indication:  Manic-Depression     QUEtiapine 100 MG tablet  Commonly known  as:  SEROQUEL  Take 5 tablets (500 mg total) by mouth at bedtime.   Indication:  mood stabilization     traZODone 100 MG tablet  Commonly known as:  DESYREL  Take 2 tablets (200 mg total) by mouth at bedtime.   Indication:  Trouble Sleeping           Follow-up Information    Follow up with [facility] of the [place].   Why:  Resume services here if ACT team does not work out for you.   Contact information:   [place] [address]      Follow up with [facility].   Why:  Call Crystal tomorrow [Tuesday] to follow up on referral Rod faxed over to them   Contact information:   [phone]      Follow up with [facility].   Why:  Call Eddie Candle or Delores tomorrow to follow up on CST referral that Rod faxed to them   Contact information:   [phone]      Follow-up recommendations:  Activity:  as tol Diet:   as tol  Comments:  1.  Take all your medications as prescribed.   2.  Report any adverse side effects to outpatient provider. 3.  Patient instructed to not use alcohol or illegal drugs while  on prescription medicines. 4.  In the event of worsening symptoms, instructed patient to call 911, the crisis hotline or go to nearest emergency room for evaluation of symptoms.  Signed: Janett Labella, NP [facility] 10/29/2015, 4:00 PM  Patient seen, Suicide Assessment Completed.  Disposition Plan Reviewed

## 2015-11-18 ENCOUNTER — Emergency Department (HOSPITAL_COMMUNITY)
Admission: EM | Admit: 2015-11-18 | Discharge: 2015-11-19 | Disposition: A | Discharge status: Other Acute Inpatient Hospital | Payer: Self-pay | Attending: Emergency Medicine | Admitting: Emergency Medicine

## 2015-11-18 ENCOUNTER — Encounter (HOSPITAL_COMMUNITY): Payer: Self-pay | Admitting: Emergency Medicine

## 2015-11-18 DIAGNOSIS — J45909 Unspecified asthma, uncomplicated: Secondary | ICD-10-CM | POA: Insufficient documentation

## 2015-11-18 DIAGNOSIS — Z7984 Long term (current) use of oral hypoglycemic drugs: Secondary | ICD-10-CM | POA: Insufficient documentation

## 2015-11-18 DIAGNOSIS — F32A Depression, unspecified: Secondary | ICD-10-CM

## 2015-11-18 DIAGNOSIS — I509 Heart failure, unspecified: Secondary | ICD-10-CM | POA: Insufficient documentation

## 2015-11-18 DIAGNOSIS — M1712 Unilateral primary osteoarthritis, left knee: Secondary | ICD-10-CM | POA: Insufficient documentation

## 2015-11-18 DIAGNOSIS — Z794 Long term (current) use of insulin: Secondary | ICD-10-CM | POA: Insufficient documentation

## 2015-11-18 DIAGNOSIS — R45851 Suicidal ideations: Secondary | ICD-10-CM

## 2015-11-18 DIAGNOSIS — F17210 Nicotine dependence, cigarettes, uncomplicated: Secondary | ICD-10-CM | POA: Insufficient documentation

## 2015-11-18 DIAGNOSIS — F329 Major depressive disorder, single episode, unspecified: Secondary | ICD-10-CM | POA: Insufficient documentation

## 2015-11-18 DIAGNOSIS — E119 Type 2 diabetes mellitus without complications: Secondary | ICD-10-CM | POA: Insufficient documentation

## 2015-11-18 DIAGNOSIS — M1711 Unilateral primary osteoarthritis, right knee: Secondary | ICD-10-CM | POA: Insufficient documentation

## 2015-11-18 DIAGNOSIS — E876 Hypokalemia: Secondary | ICD-10-CM | POA: Insufficient documentation

## 2015-11-18 DIAGNOSIS — I110 Hypertensive heart disease with heart failure: Secondary | ICD-10-CM | POA: Insufficient documentation

## 2015-11-18 DIAGNOSIS — Z7982 Long term (current) use of aspirin: Secondary | ICD-10-CM | POA: Insufficient documentation

## 2015-11-18 DIAGNOSIS — I10 Essential (primary) hypertension: Secondary | ICD-10-CM

## 2015-11-18 LAB — ETHANOL: Alcohol, Ethyl (B): <5 mg/dL (ref <5)

## 2015-11-18 LAB — COMPREHENSIVE METABOLIC PANEL
ALBUMIN: 4.2 g/dL (ref 3.5–5.0)
ALT: 21 U/L (ref 14–54)
ANION GAP: 10 (ref 5–15)
AST: 31 U/L (ref 15–41)
Alkaline Phosphatase: 70 U/L (ref 38–126)
BILIRUBIN TOTAL: 0.7 mg/dL (ref 0.3–1.2)
BUN: 5 mg/dL — AB (ref 6–20)
CHLORIDE: 105 mmol/L (ref 101–111)
CO2: 27 mmol/L (ref 22–32)
Calcium: 9.4 mg/dL (ref 8.9–10.3)
Creatinine, Ser: 0.87 mg/dL (ref 0.44–1.00)
GFR calc Af Amer: >60 mL/min (ref >60)
GFR calc non Af Amer: >60 mL/min (ref >60)
GLUCOSE: 97 mg/dL (ref 65–99)
POTASSIUM: 3 mmol/L — AB (ref 3.5–5.1)
SODIUM: 142 mmol/L (ref 135–145)
TOTAL PROTEIN: 7.7 g/dL (ref 6.5–8.1)

## 2015-11-18 LAB — CBC
HCT: 36.5 % (ref 36.0–46.0)
Hemoglobin: 12.4 g/dL (ref 12.0–15.0)
MCH: 29 pg (ref 26.0–34.0)
MCHC: 34 g/dL (ref 30.0–36.0)
MCV: 85.5 fL (ref 78.0–100.0)
PLATELETS: 212 10*3/uL (ref 150–400)
RBC: 4.27 MIL/uL (ref 3.87–5.11)
RDW: 13.3 % (ref 11.5–15.5)
WBC: 7.9 10*3/uL (ref 4.0–10.5)

## 2015-11-18 LAB — CBG MONITORING, ED
GLUCOSE-CAPILLARY: 126 mg/dL — AB (ref 65–99)
Glucose-Capillary: 84 mg/dL (ref 65–99)

## 2015-11-18 LAB — RAPID URINE DRUG SCREEN, HOSP PERFORMED
Amphetamines: NOT DETECTED
Barbiturates: NOT DETECTED
Benzodiazepines: NOT DETECTED
COCAINE: POSITIVE — AB
OPIATES: NOT DETECTED
Tetrahydrocannabinol: NOT DETECTED

## 2015-11-18 LAB — SALICYLATE LEVEL: Salicylate Lvl: <4 mg/dL (ref 2.8–30.0)

## 2015-11-18 LAB — ACETAMINOPHEN LEVEL

## 2015-11-18 MED ORDER — INSULIN ASPART 100 UNIT/ML ~~LOC~~ SOLN: 0.0000 [IU] | Freq: Three times a day (TID) | SUBCUTANEOUS | Status: DC

## 2015-11-18 MED ORDER — POTASSIUM CHLORIDE CRYS ER 20 MEQ PO TBCR
40.0000 meq | EXTENDED_RELEASE_TABLET | Freq: Once | ORAL | Status: AC
  Administered 2015-11-18: 40 meq via ORAL
  Filled 2015-11-18: qty 2

## 2015-11-18 MED ORDER — NICOTINE 21 MG/24HR TD PT24
21.0000 mg | MEDICATED_PATCH | Freq: Every day | TRANSDERMAL | Status: DC
  Administered 2015-11-18: 21 mg via TRANSDERMAL
  Filled 2015-11-18: qty 1

## 2015-11-18 MED ORDER — OXCARBAZEPINE 300 MG PO TABS
600.0000 mg | ORAL_TABLET | Freq: Every evening | ORAL | Status: DC
  Administered 2015-11-18: 600 mg via ORAL
  Filled 2015-11-18: qty 2

## 2015-11-18 MED ORDER — ACETAMINOPHEN 325 MG PO TABS: 650.0000 mg | ORAL_TABLET | ORAL | Status: DC | PRN

## 2015-11-18 MED ORDER — LINAGLIPTIN 5 MG PO TABS
5.0000 mg | ORAL_TABLET | Freq: Every day | ORAL | Status: DC
  Administered 2015-11-18: 5 mg via ORAL
  Filled 2015-11-18 (×2): qty 1

## 2015-11-18 MED ORDER — ATORVASTATIN CALCIUM 40 MG PO TABS
40.0000 mg | ORAL_TABLET | Freq: Every evening | ORAL | Status: DC
  Administered 2015-11-18: 40 mg via ORAL
  Filled 2015-11-18 (×2): qty 1

## 2015-11-18 MED ORDER — GABAPENTIN 400 MG PO CAPS
400.0000 mg | ORAL_CAPSULE | Freq: Two times a day (BID) | ORAL | Status: DC
  Administered 2015-11-18 (×2): 400 mg via ORAL
  Filled 2015-11-18 (×2): qty 1

## 2015-11-18 MED ORDER — ATENOLOL 100 MG PO TABS
100.0000 mg | ORAL_TABLET | Freq: Every day | ORAL | Status: DC
  Administered 2015-11-18: 100 mg via ORAL
  Filled 2015-11-18 (×2): qty 1

## 2015-11-18 MED ORDER — FLUOXETINE HCL 10 MG PO CAPS
10.0000 mg | ORAL_CAPSULE | Freq: Every day | ORAL | Status: DC
  Administered 2015-11-18: 10 mg via ORAL
  Filled 2015-11-18: qty 1

## 2015-11-18 MED ORDER — HYDROXYZINE HCL 25 MG PO TABS: 25.0000 mg | ORAL_TABLET | Freq: Four times a day (QID) | ORAL | Status: DC | PRN

## 2015-11-18 MED ORDER — ARIPIPRAZOLE 5 MG PO TABS
5.0000 mg | ORAL_TABLET | Freq: Every day | ORAL | Status: DC
  Administered 2015-11-18: 5 mg via ORAL
  Filled 2015-11-18 (×2): qty 1

## 2015-11-18 MED ORDER — ASPIRIN 81 MG PO CHEW
81.0000 mg | CHEWABLE_TABLET | Freq: Every day | ORAL | Status: DC
  Administered 2015-11-18: 81 mg via ORAL
  Filled 2015-11-18: qty 1

## 2015-11-18 MED ORDER — METFORMIN HCL 500 MG PO TABS
1000.0000 mg | ORAL_TABLET | Freq: Two times a day (BID) | ORAL | Status: DC
  Administered 2015-11-18: 1000 mg via ORAL
  Filled 2015-11-18 (×4): qty 2

## 2015-11-18 MED ORDER — INSULIN GLARGINE 100 UNIT/ML ~~LOC~~ SOLN
30.0000 [IU] | Freq: Every day | SUBCUTANEOUS | Status: DC
  Administered 2015-11-18: 30 [IU] via SUBCUTANEOUS
  Filled 2015-11-18: qty 0.3

## 2015-11-18 MED ORDER — LOSARTAN POTASSIUM 25 MG PO TABS
25.0000 mg | ORAL_TABLET | Freq: Two times a day (BID) | ORAL | Status: DC
  Administered 2015-11-18 (×2): 25 mg via ORAL
  Filled 2015-11-18 (×4): qty 1

## 2015-11-18 MED ORDER — AMLODIPINE BESYLATE 5 MG PO TABS
5.0000 mg | ORAL_TABLET | Freq: Every day | ORAL | Status: DC
  Administered 2015-11-18: 5 mg via ORAL
  Filled 2015-11-18 (×2): qty 1

## 2015-11-18 MED ORDER — QUETIAPINE FUMARATE 300 MG PO TABS
500.0000 mg | ORAL_TABLET | Freq: Every day | ORAL | Status: DC
  Administered 2015-11-18: 500 mg via ORAL
  Filled 2015-11-18: qty 2

## 2015-11-18 NOTE — ED Notes (Signed)
Pt admitted to room #43. Pt behavior cooperative, pleasant on approach. Pt endorsing passive SI without plan. Pt endorsing HI, endorsing auditory hallucinations. Pt reports increase in depression. Reports she is currently homeless and that her boyfriend kicked her out. Pt reports she is unemployed and has no income. Pt reports drug and alcohol use. Reports she uses cocaine twice a month, with her last use being last night, 20$ worth. Pt reports she drinks beer 3x week. Pt reports she has been off her medication for 1 1/2 weeks. Sperical checks q 15 mins in place for safety. Video monitoring in place.

## 2015-11-18 NOTE — ED Provider Notes (Signed)
CSN: [id number]     Arrival date & time 11/18/15  1124 History   First MD Initiated Contact with Patient 11/18/15 1210     Chief Complaint  Patient presents with  . Suicidal  . Depression     (Consider location/radiation/quality/duration/timing/severity/associated sxs/prior Treatment) Patient is a 50 y.o. female presenting with depression. The history is provided by the patient.  Depression Pertinent negatives include no chest pain, no abdominal pain, no headaches and no shortness of breath.  Patient with hx bipolar disorder, c/o increased feelings of depression in the past week, with thoughts of suicide by cutting self.  Denies any attempt to harm self. States has not taken her meds in the past couple weeks and when was taking didn't notice much improvement.  Denies specific inciting event, but notes increased stress/arguements with significant other. States physical health at baseline, no acute illness.  No fever or chills. Patient denies headache. No chest pain or sob.  Patient also notes intermittent etoh/substance abuse.      Past Medical History  Diagnosis Date  . High cholesterol   . Depression   . Gout   . Anxiety   . Bipolar 1 disorder ([place])   . GERD (gastroesophageal reflux disease)   . Substance abuse     crack cocaine  . Headache(784.0)     migraines  . TMJ (temporomandibular joint disorder)   . Asthma     daily and prn inhalers  . Arthritis     back and knees  . Gastric ulcer   . 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 Hypertension     under control, has been on med. x 12 yrs.  . Diabetes mellitus     diet-controlled  . CHF (congestive heart failure) [facility])    Past Surgical History  Procedure Laterality Date  . Knee arthroscopy w/ partial medial meniscectomy  05/01/2010    right  . Breast lumpectomy with needle localization  04/19/2012    Procedure: BREAST LUMPECTOMY WITH NEEDLE LOCALIZATION;  Surgeon: [name], MD;  Location: [facility];  Service: General;  Laterality: Right;   Family History  Problem Relation Age of Onset  . Diabetes Mother   . Breast cancer Mother   . Cancer Father   . Bipolar disorder Maternal Aunt   . Schizophrenia Maternal Grandfather   . Alcoholism Maternal Uncle    Social History  Substance Use Topics  . Smoking status: Current Every Day Smoker -- 0.50 packs/day for 15 years    Types: Cigarettes    Last Attempt to Quit: 08/02/2012  . Smokeless tobacco: Never Used  . Alcohol Use: 0.0 oz/week     Comment: reports she drinks 3 40 ounce beers daily   OB History    Gravida Para Term Preterm AB TAB SAB Ectopic Multiple Living   3 2 2  1  1   2      Review of Systems  Constitutional: Negative for fever.  HENT: Negative for sore throat.   Eyes: Negative for pain.  Respiratory: Negative for shortness of breath.   Cardiovascular: Negative for chest pain.  Gastrointestinal: Negative for vomiting and abdominal pain.  Genitourinary: Negative for flank pain.  Musculoskeletal: Negative for back pain and neck pain.  Skin: Negative for rash.  Neurological: Negative for headaches.  Hematological: Does not bruise/bleed easily.  Psychiatric/Behavioral: Positive for depression and dysphoric mood.      Allergies  Chocolate; Orange; Penicillins; Other; and  Tomato  Home Medications   Prior to Admission medications   Medication Sig Start Date End Date Taking? Authorizing Provider  amLODipine (NORVASC) 5 MG tablet Take 1 tablet (5 mg total) by mouth daily. 10/29/15   Kerrie Buffalo, NP  ARIPiprazole (ABILIFY) 5 MG tablet Take 1 tablet (5 mg total) by mouth daily. 10/29/15   Kerrie Buffalo, NP  ARIPiprazole 400 MG SUSR Inject 400 mg into the muscle every 28 (twenty-eight) days. Last dose 5/8, next dose due 11/22/15 10/29/15   Kerrie Buffalo, NP  aspirin 81 MG chewable tablet Chew 1 tablet (81 mg total) by mouth daily. 10/29/15   Kerrie Buffalo, NP  atenolol (TENORMIN) 100 MG tablet Take  1 tablet (100 mg total) by mouth daily. 10/29/15   Kerrie Buffalo, NP  atorvastatin (LIPITOR) 40 MG tablet Take 1 tablet (40 mg total) by mouth every evening. 10/29/15   Kerrie Buffalo, NP  baclofen (LIORESAL) 10 MG tablet Take 1 tablet (10 mg total) by mouth 3 (three) times daily. 10/29/15   Kerrie Buffalo, NP  celecoxib (CELEBREX) 100 MG capsule Take 1 capsule (100 mg total) by mouth 2 (two) times daily. 10/29/15   Kerrie Buffalo, NP  FLUoxetine (PROZAC) 10 MG capsule Take 1 capsule (10 mg total) by mouth daily. 10/29/15   Kerrie Buffalo, NP  gabapentin (NEURONTIN) 400 MG capsule Take 1 capsule (400 mg total) by mouth 2 (two) times daily. 10/29/15   Kerrie Buffalo, NP  hydrOXYzine (ATARAX/VISTARIL) 25 MG tablet Take 1 tablet (25 mg total) by mouth every 6 (six) hours as needed for anxiety. 10/29/15   Kerrie Buffalo, NP  insulin glargine (LANTUS) 100 UNIT/ML injection Inject 0.3 mLs (30 Units total) into the skin at bedtime. 10/29/15   Kerrie Buffalo, NP  linagliptin (TRADJENTA) 5 MG TABS tablet Take 1 tablet (5 mg total) by mouth daily. 10/29/15   Kerrie Buffalo, NP  losartan (COZAAR) 25 MG tablet Take 1 tablet (25 mg total) by mouth 2 (two) times daily. 10/29/15   Kerrie Buffalo, NP  metFORMIN (GLUCOPHAGE) 1000 MG tablet Take 1 tablet (1,000 mg total) by mouth 2 (two) times daily with a meal. 10/29/15   Kerrie Buffalo, NP  Oxcarbazepine (TRILEPTAL) 600 MG tablet Take 1 tablet (600 mg total) by mouth every evening. 10/29/15   Kerrie Buffalo, NP  QUEtiapine (SEROQUEL) 100 MG tablet Take 5 tablets (500 mg total) by mouth at bedtime. 10/29/15   Kerrie Buffalo, NP  traZODone (DESYREL) 100 MG tablet Take 2 tablets (200 mg total) by mouth at bedtime. 10/29/15   Kerrie Buffalo, NP   BP 157/112 mmHg  Pulse 104  Temp(Src) 98.5 F (36.9 C) (Oral)  Resp 20  SpO2 100%  LMP  (Approximate) Physical Exam  Constitutional: She is oriented to person, place, and time. She appears well-developed and well-nourished. No  distress.  HENT:  Mouth/Throat: Oropharynx is clear and moist.  Eyes: Conjunctivae are normal. Pupils are equal, round, and reactive to light. No scleral icterus.  Neck: Neck supple. No tracheal deviation present.  Cardiovascular: Normal rate, regular rhythm, normal heart sounds and intact distal pulses.   Pulmonary/Chest: Effort normal and breath sounds normal. No respiratory distress.  Abdominal: Soft. Normal appearance and bowel sounds are normal. She exhibits no distension. There is no tenderness.  Musculoskeletal: She exhibits no edema.  Neurological: She is alert and oriented to person, place, and time.  Steady gait.   Skin: Skin is warm and dry. No rash noted. She is not diaphoretic.  Psychiatric:  Depressed mood. +SI.  Nursing note and vitals reviewed.   ED Course  Procedures (including critical care time) Labs Review  Results for orders placed or performed during the hospital encounter of 11/18/15  Comprehensive metabolic panel  Result Value Ref Range   Sodium 142 135 - 145 mmol/L   Potassium 3.0 (L) 3.5 - 5.1 mmol/L   Chloride 105 101 - 111 mmol/L   CO2 27 22 - 32 mmol/L   Glucose, Bld 97 65 - 99 mg/dL   BUN 5 (L) 6 - 20 mg/dL   Creatinine, Ser 0.87 0.44 - 1.00 mg/dL   Calcium 9.4 8.9 - 10.3 mg/dL   Total Protein 7.7 6.5 - 8.1 g/dL   Albumin 4.2 3.5 - 5.0 g/dL   AST 31 15 - 41 U/L   ALT 21 14 - 54 U/L   Alkaline Phosphatase 70 38 - 126 U/L   Total Bilirubin 0.7 0.3 - 1.2 mg/dL   GFR calc non Af Amer >60 >60 mL/min   GFR calc Af Amer >60 >60 mL/min   Anion gap 10 5 - 15  Ethanol  Result Value Ref Range   Alcohol, Ethyl (B) <5 <5 mg/dL  Salicylate level  Result Value Ref Range   Salicylate Lvl 123456 2.8 - 30.0 mg/dL  Acetaminophen level  Result Value Ref Range   Acetaminophen (Tylenol), Serum <10 (L) 10 - 30 ug/mL  cbc  Result Value Ref Range   WBC 7.9 4.0 - 10.5 K/uL   RBC 4.27 3.87 - 5.11 MIL/uL   Hemoglobin 12.4 12.0 - 15.0 g/dL   HCT 36.5 36.0 - 46.0  %   MCV 85.5 78.0 - 100.0 fL   MCH 29.0 26.0 - 34.0 pg   MCHC 34.0 30.0 - 36.0 g/dL   RDW 13.3 11.5 - 15.5 %   Platelets 212 150 - 400 K/uL     I have personally reviewed and evaluated these lab results as part of my medical decision-making.    MDM   Labs.  Reviewed nursing notes and prior charts for additional history.   Psych holding orders, will order home meds.   [facility] team consulted - disposition per [facility].  Kcl low. kcl po.  Recheck alert, content, no distress.  [place] eval pending - disposition per [facility] team.       Lajean Saver, MD 11/18/15 [phone]

## 2015-11-18 NOTE — ED Notes (Signed)
unsuccessful blood draw. RN have been made aware

## 2015-11-18 NOTE — ED Notes (Signed)
Bed: WBH43 Expected date:  Expected time:  Means of arrival:  Comments: Triage 4 
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
## 2015-11-18 NOTE — Progress Notes (Signed)
Disposition CSW completed patient referrals to the following inpatient psych facilities:  [place]   CSW will continue to follow patient for placement needs.  [place] Disposition CSW [phone]

## 2015-11-18 NOTE — ED Notes (Signed)
Patient noted in room. No complaints, stable, in no acute distress. Q15 minute rounds and monitoring via security cameras continue for safety.
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
## 2015-11-19 ENCOUNTER — Inpatient Hospital Stay (HOSPITAL_COMMUNITY)
Admission: AD | Admit: 2015-11-19 | Discharge: 2015-11-29 | DRG: 885 | Disposition: A | Discharge status: Home / Self Care | Payer: No Typology Code available for payment source | Source: Intra-hospital | Attending: Psychiatry | Admitting: Psychiatry

## 2015-11-19 ENCOUNTER — Encounter (HOSPITAL_COMMUNITY): Payer: Self-pay

## 2015-11-19 DIAGNOSIS — E7800 Pure hypercholesterolemia, unspecified: Secondary | ICD-10-CM | POA: Diagnosis present

## 2015-11-19 DIAGNOSIS — Z9114 Patient's other noncompliance with medication regimen: Secondary | ICD-10-CM | POA: Diagnosis not present

## 2015-11-19 DIAGNOSIS — Z915 Personal history of self-harm: Secondary | ICD-10-CM | POA: Diagnosis not present

## 2015-11-19 DIAGNOSIS — Z590 Homelessness: Secondary | ICD-10-CM

## 2015-11-19 DIAGNOSIS — F1220 Cannabis dependence, uncomplicated: Secondary | ICD-10-CM | POA: Diagnosis not present

## 2015-11-19 DIAGNOSIS — F250 Schizoaffective disorder, bipolar type: Secondary | ICD-10-CM | POA: Diagnosis not present

## 2015-11-19 DIAGNOSIS — J45909 Unspecified asthma, uncomplicated: Secondary | ICD-10-CM | POA: Diagnosis present

## 2015-11-19 DIAGNOSIS — F17200 Nicotine dependence, unspecified, uncomplicated: Secondary | ICD-10-CM | POA: Diagnosis present

## 2015-11-19 DIAGNOSIS — G4700 Insomnia, unspecified: Secondary | ICD-10-CM | POA: Diagnosis present

## 2015-11-19 DIAGNOSIS — F17210 Nicotine dependence, cigarettes, uncomplicated: Secondary | ICD-10-CM | POA: Diagnosis present

## 2015-11-19 DIAGNOSIS — F1010 Alcohol abuse, uncomplicated: Secondary | ICD-10-CM | POA: Diagnosis present

## 2015-11-19 DIAGNOSIS — Z794 Long term (current) use of insulin: Secondary | ICD-10-CM

## 2015-11-19 DIAGNOSIS — E119 Type 2 diabetes mellitus without complications: Secondary | ICD-10-CM | POA: Diagnosis present

## 2015-11-19 DIAGNOSIS — F1420 Cocaine dependence, uncomplicated: Secondary | ICD-10-CM | POA: Diagnosis present

## 2015-11-19 DIAGNOSIS — F259 Schizoaffective disorder, unspecified: Secondary | ICD-10-CM | POA: Diagnosis not present

## 2015-11-19 DIAGNOSIS — R45851 Suicidal ideations: Secondary | ICD-10-CM | POA: Diagnosis present

## 2015-11-19 DIAGNOSIS — Z803 Family history of malignant neoplasm of breast: Secondary | ICD-10-CM | POA: Diagnosis not present

## 2015-11-19 DIAGNOSIS — Z818 Family history of other mental and behavioral disorders: Secondary | ICD-10-CM

## 2015-11-19 DIAGNOSIS — F419 Anxiety disorder, unspecified: Secondary | ICD-10-CM | POA: Diagnosis present

## 2015-11-19 DIAGNOSIS — Z833 Family history of diabetes mellitus: Secondary | ICD-10-CM | POA: Diagnosis not present

## 2015-11-19 DIAGNOSIS — Z7982 Long term (current) use of aspirin: Secondary | ICD-10-CM | POA: Diagnosis not present

## 2015-11-19 DIAGNOSIS — Z79899 Other long term (current) drug therapy: Secondary | ICD-10-CM | POA: Diagnosis not present

## 2015-11-19 DIAGNOSIS — E785 Hyperlipidemia, unspecified: Secondary | ICD-10-CM | POA: Diagnosis present

## 2015-11-19 DIAGNOSIS — I10 Essential (primary) hypertension: Secondary | ICD-10-CM | POA: Diagnosis present

## 2015-11-19 DIAGNOSIS — Z8673 Personal history of transient ischemic attack (TIA), and cerebral infarction without residual deficits: Secondary | ICD-10-CM

## 2015-11-19 DIAGNOSIS — K219 Gastro-esophageal reflux disease without esophagitis: Secondary | ICD-10-CM | POA: Diagnosis present

## 2015-11-19 DIAGNOSIS — F1020 Alcohol dependence, uncomplicated: Secondary | ICD-10-CM | POA: Diagnosis not present

## 2015-11-19 LAB — GLUCOSE, CAPILLARY
GLUCOSE-CAPILLARY: 144 mg/dL — AB (ref 65–99)
GLUCOSE-CAPILLARY: 108 mg/dL — AB (ref 65–99)
Glucose-Capillary: 160 mg/dL — ABNORMAL HIGH (ref 65–99)
Glucose-Capillary: 76 mg/dL (ref 65–99)
Glucose-Capillary: 98 mg/dL (ref 65–99)

## 2015-11-19 MED ORDER — AMLODIPINE BESYLATE 5 MG PO TABS
5.0000 mg | ORAL_TABLET | Freq: Every day | ORAL | Status: DC
  Administered 2015-11-19 – 2015-11-29 (×11): 5 mg via ORAL
  Filled 2015-11-19 (×12): qty 1
  Filled 2015-11-19: qty 7
  Filled 2015-11-19: qty 1

## 2015-11-19 MED ORDER — LINAGLIPTIN 5 MG PO TABS
5.0000 mg | ORAL_TABLET | Freq: Every day | ORAL | Status: DC
  Administered 2015-11-19 – 2015-11-29 (×11): 5 mg via ORAL
  Filled 2015-11-19 (×8): qty 1
  Filled 2015-11-19: qty 7
  Filled 2015-11-19 (×6): qty 1

## 2015-11-19 MED ORDER — INSULIN ASPART 100 UNIT/ML ~~LOC~~ SOLN
0.0000 [IU] | Freq: Three times a day (TID) | SUBCUTANEOUS | Status: DC
Start: 2015-11-19 — End: 2015-11-29
  Administered 2015-11-19: 1 [IU] via SUBCUTANEOUS
  Administered 2015-11-20: 0 [IU] via SUBCUTANEOUS
  Administered 2015-11-23: 1 [IU] via SUBCUTANEOUS
  Administered 2015-11-25: 2 [IU] via SUBCUTANEOUS
  Administered 2015-11-26: 1 [IU] via SUBCUTANEOUS
  Administered 2015-11-27: 2 [IU] via SUBCUTANEOUS
  Administered 2015-11-27: 1 [IU] via SUBCUTANEOUS
  Administered 2015-11-28: 2 [IU] via SUBCUTANEOUS

## 2015-11-19 MED ORDER — ACETAMINOPHEN 325 MG PO TABS
650.0000 mg | ORAL_TABLET | Freq: Four times a day (QID) | ORAL | Status: DC | PRN
  Administered 2015-11-19 – 2015-11-26 (×8): 650 mg via ORAL
  Filled 2015-11-19 (×10): qty 2

## 2015-11-19 MED ORDER — QUETIAPINE FUMARATE 200 MG PO TABS
500.0000 mg | ORAL_TABLET | Freq: Every day | ORAL | Status: DC
  Administered 2015-11-19 – 2015-11-21 (×3): 500 mg via ORAL
  Filled 2015-11-19 (×2): qty 2.5
  Filled 2015-11-19: qty 3
  Filled 2015-11-19 (×2): qty 2.5

## 2015-11-19 MED ORDER — POTASSIUM CHLORIDE CRYS ER 10 MEQ PO TBCR
10.0000 meq | EXTENDED_RELEASE_TABLET | Freq: Three times a day (TID) | ORAL | Status: AC
  Administered 2015-11-19 – 2015-11-21 (×5): 10 meq via ORAL
  Filled 2015-11-19 (×8): qty 1

## 2015-11-19 MED ORDER — ALUM & MAG HYDROXIDE-SIMETH 200-200-20 MG/5ML PO SUSP: 30.0000 mL | ORAL | Status: DC | PRN

## 2015-11-19 MED ORDER — INSULIN ASPART 100 UNIT/ML ~~LOC~~ SOLN: 0.0000 [IU] | Freq: Every day | SUBCUTANEOUS | Status: DC

## 2015-11-19 MED ORDER — LORATADINE 10 MG PO TABS
10.0000 mg | ORAL_TABLET | Freq: Every day | ORAL | Status: DC
  Administered 2015-11-19 – 2015-11-29 (×11): 10 mg via ORAL
  Filled 2015-11-19 (×14): qty 1

## 2015-11-19 MED ORDER — GABAPENTIN 400 MG PO CAPS
400.0000 mg | ORAL_CAPSULE | Freq: Two times a day (BID) | ORAL | Status: DC
  Administered 2015-11-19 – 2015-11-20 (×3): 400 mg via ORAL
  Filled 2015-11-19 (×6): qty 1

## 2015-11-19 MED ORDER — INSULIN GLARGINE 100 UNIT/ML ~~LOC~~ SOLN
30.0000 [IU] | Freq: Every day | SUBCUTANEOUS | Status: DC
  Administered 2015-11-19 – 2015-11-28 (×10): 30 [IU] via SUBCUTANEOUS

## 2015-11-19 MED ORDER — MAGNESIUM HYDROXIDE 400 MG/5ML PO SUSP: 30.0000 mL | Freq: Every day | ORAL | Status: DC | PRN

## 2015-11-19 MED ORDER — NICOTINE 21 MG/24HR TD PT24
21.0000 mg | MEDICATED_PATCH | Freq: Every day | TRANSDERMAL | Status: DC
  Administered 2015-11-19 – 2015-11-26 (×8): 21 mg via TRANSDERMAL
  Filled 2015-11-19 (×15): qty 1

## 2015-11-19 MED ORDER — ATORVASTATIN CALCIUM 40 MG PO TABS
40.0000 mg | ORAL_TABLET | Freq: Every evening | ORAL | Status: DC
  Administered 2015-11-19 – 2015-11-28 (×10): 40 mg via ORAL
  Filled 2015-11-19 (×3): qty 1
  Filled 2015-11-19: qty 3
  Filled 2015-11-19 (×10): qty 1

## 2015-11-19 MED ORDER — INSULIN ASPART 100 UNIT/ML ~~LOC~~ SOLN: 0.0000 [IU] | Freq: Three times a day (TID) | SUBCUTANEOUS | Status: DC

## 2015-11-19 MED ORDER — METFORMIN HCL 500 MG PO TABS
1000.0000 mg | ORAL_TABLET | Freq: Two times a day (BID) | ORAL | Status: DC
  Administered 2015-11-19 – 2015-11-29 (×21): 1000 mg via ORAL
  Filled 2015-11-19 (×2): qty 2
  Filled 2015-11-19: qty 28
  Filled 2015-11-19 (×14): qty 2
  Filled 2015-11-19: qty 28
  Filled 2015-11-19 (×8): qty 2

## 2015-11-19 NOTE — Tx Team (Signed)
Initial Interdisciplinary Treatment Plan   PATIENT STRESSORS: Financial difficulties Loss of relationship, recent problems w/ boyfriend Medication change or noncompliance Substance abuse   PATIENT STRENGTHS: Ability for insight Average or above average intelligence   PROBLEM LIST: Problem List/Patient Goals Date to be addressed Date deferred Reason deferred Estimated date of resolution  SI 11/19/15     Depression 11/19/15     Anxiety 11/19/15     Substance/Etoh abuse 11/19/15           "I want help finding housing and finding myself."                         DISCHARGE CRITERIA:  Adequate post-discharge living arrangements Improved stabilization in mood, thinking, and/or behavior  PRELIMINARY DISCHARGE PLAN: Placement in alternative living arrangements  PATIENT/FAMIILY INVOLVEMENT: This treatment plan has been presented to and reviewed with the patient, Maureen Swanson, and/or family member.  The patient and family have been given the opportunity to ask questions and make suggestions.  Aurora Mask 11/19/2015, 4:16 AM

## 2015-11-19 NOTE — Progress Notes (Signed)
Maureen Swanson is 50 years old, admitted voluntarily with SI with a plan to overdose or cut herself. She c/o decreased sleep, crying spells and poor appetite. She reports that she hears voices telling her to harm herself. She has a history of prior suicide attempts. Her recent stressors include being kicked out from her female friends home and an upcoming court day for trespassing charges. She denies hi and visual hallucinations. She has had previous admits here at [facility] and she reports that she has been non compliant with her medications for at least 1 1/2 weeks because she has been drinking at least 3 (40s) daily and she admitted that she smoked crack 1 day last Thursday. She was oriented to unit and safety maintained with 15 min checks.
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
## 2015-11-19 NOTE — Tx Team (Signed)
Interdisciplinary Treatment Plan Update (Adult) Date: 11/19/2015   Date: 11/19/2015 4:18 PM  Progress in Treatment:  Attending groups: Pt is new to milieu, continuing to assess  Participating in groups: Pt is new to milieu, continuing to assess   Taking medication as prescribed: Yes  Tolerating medication: Yes  Family/Significant othe contact made: No, CSW assessing for appropriate Patient understands diagnosis: Yes AEB seeking help with depression and AVH Discussing patient identified problems/goals with staff: Yes  Medical problems stabilized or resolved: Yes  Denies suicidal/homicidal ideation: No Pt recently admitted with SI Patient has not harmed self or Others: Yes   New problem(s) identified: None identified at this time.   Discharge Plan or Barriers: CSW will assess for appropriate discharge plan and relevant barriers.   Additional comments:  Patient and CSW reviewed pt's identified goals and treatment plan. Patient verbalized understanding and agreed to treatment plan. CSW reviewed [facility] "Discharge Process and Patient Involvement" Form. Pt verbalized understanding of information provided and signed form.   Reason for Continuation of Hospitalization:  Depression Medication stabilization Suicidal ideation Auditory hallucinations  Estimated length of stay: 3-5 days  Review of initial/current patient goals per problem list:   1.  Goal(s): Patient will participate in aftercare plan  Met:  No  Target date: 3-5 days from date of admission   As evidenced by: Patient will participate within aftercare plan AEB aftercare provider and housing plan at discharge being identified.  11/19/15: CSW to work with Pt to assess for appropriate discharge plan and faciliate appointments and referrals as needed prior to d/c.  2.  Goal (s): Patient will exhibit decreased depressive symptoms and suicidal ideations.  Met:  No  Target date: 3-5 days from date of admission   As evidenced by:  Patient will utilize self rating of depression at 3 or below and demonstrate decreased signs of depression or be deemed stable for discharge by MD. 11/19/15: Pt was admitted with symptoms of depression, rating 10/10. Pt continues to present with flat affect and depressive symptoms.  Pt will demonstrate decreased symptoms of depression and rate depression at 3/10 or lower prior to discharge.  5.  Goal(s): Patient will demonstrate decreased signs of psychosis  . Met:  No . Target date: 3-5 days from date of admission  . As evidenced by: Patient will demonstrate decreased frequency of AVH or return to baseline function   -11/19/15: Pt recently admitted with auditory   hallucinations and continues to endorse those  Attendees:  Patient:    Family:    Physician: Dr. Parke Poisson, MD  11/19/2015 4:18 PM  Nursing: Lars Pinks, RN Case manager  11/19/2015 4:18 PM  Clinical Social Worker Peri Maris, [place] 11/19/2015 4:18 PM  Other: Tilden Fossa, LCSWA 11/19/2015 4:18 PM  Clinical: Eulogio Bear, RN 11/19/2015 4:18 PM  Other: , RN Charge Nurse 11/19/2015 4:18 PM  Other:     Peri Maris, [place] Work [phone]
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
## 2015-11-19 NOTE — BHH Group Notes (Signed)
[facility] LCSW Aftercare Discharge Planning Group Note  11/19/2015 8:45 AM  Pt did not attend, declined invitation.   Peri Maris, LCSWA 11/19/2015 4:25 PM

## 2015-11-19 NOTE — ED Notes (Signed)
Report given to Opal Sidles at [facility].

## 2015-11-19 NOTE — Progress Notes (Signed)
Adult Psychoeducational Group Note  Date:  11/19/2015 Time:  8:46 PM  Group Topic/Focus:  Wrap-Up Group:   The focus of this group is to help patients review their daily goal of treatment and discuss progress on daily workbooks.  Participation Level:  Did Not Attend  Participation Quality:  Did not attend  Affect:  Did not attend  Cognitive:  Did not attend  Insight: None  Engagement in Group:  Did not attend  Modes of Intervention:  Did not attend  Additional Comments:  Patient did not attend wrap up group tonight.  Latanja Lehenbauer L Arius Harnois 11/19/2015, 8:46 PM

## 2015-11-19 NOTE — Progress Notes (Signed)
D:Patient in bed in her room on [facility].  Patient states her day was ok.  Patient states she would much rather be on the 500 hall because that is where she has been in the past.  Patient states her goal is to get better.  Patient does appear flat and depressed.  Patient states she is passive SI but verbally contracts for safety.  Patient denies HI and denies AVH. A: Staff to monitor Q 15 mins for safety.  Encouragement and support offered.  Scheduled medications administered per orders.   R: Patient remains safe on the unit.  Patient did not attend group tonight.  Patient visible on the unit for snack and medications.  Patient taking administered medications.

## 2015-11-19 NOTE — ED Provider Notes (Signed)
12:15 AM patient is alert awake. Ambulates without difficulty pleasant and cooperative. Stable for transfer to [facility]. Dr.Kobos is accepting physician Results for orders placed or performed during the hospital encounter of 11/18/15  Comprehensive metabolic panel  Result Value Ref Range   Sodium 142 135 - 145 mmol/L   Potassium 3.0 (L) 3.5 - 5.1 mmol/L   Chloride 105 101 - 111 mmol/L   CO2 27 22 - 32 mmol/L   Glucose, Bld 97 65 - 99 mg/dL   BUN 5 (L) 6 - 20 mg/dL   Creatinine, Ser 0.87 0.44 - 1.00 mg/dL   Calcium 9.4 8.9 - 10.3 mg/dL   Total Protein 7.7 6.5 - 8.1 g/dL   Albumin 4.2 3.5 - 5.0 g/dL   AST 31 15 - 41 U/L   ALT 21 14 - 54 U/L   Alkaline Phosphatase 70 38 - 126 U/L   Total Bilirubin 0.7 0.3 - 1.2 mg/dL   GFR calc non Af Amer >60 >60 mL/min   GFR calc Af Amer >60 >60 mL/min   Anion gap 10 5 - 15  Ethanol  Result Value Ref Range   Alcohol, Ethyl (B) <5 <5 mg/dL  Salicylate level  Result Value Ref Range   Salicylate Lvl 123456 2.8 - 30.0 mg/dL  Acetaminophen level  Result Value Ref Range   Acetaminophen (Tylenol), Serum <10 (L) 10 - 30 ug/mL  cbc  Result Value Ref Range   WBC 7.9 4.0 - 10.5 K/uL   RBC 4.27 3.87 - 5.11 MIL/uL   Hemoglobin 12.4 12.0 - 15.0 g/dL   HCT 36.5 36.0 - 46.0 %   MCV 85.5 78.0 - 100.0 fL   MCH 29.0 26.0 - 34.0 pg   MCHC 34.0 30.0 - 36.0 g/dL   RDW 13.3 11.5 - 15.5 %   Platelets 212 150 - 400 K/uL  Rapid urine drug screen (hospital performed)  Result Value Ref Range   Opiates NONE DETECTED NONE DETECTED   Cocaine POSITIVE (A) NONE DETECTED   Benzodiazepines NONE DETECTED NONE DETECTED   Amphetamines NONE DETECTED NONE DETECTED   Tetrahydrocannabinol NONE DETECTED NONE DETECTED   Barbiturates NONE DETECTED NONE DETECTED  CBG monitoring, ED  Result Value Ref Range   Glucose-Capillary 84 65 - 99 mg/dL  CBG monitoring, ED  Result Value Ref Range   Glucose-Capillary 126 (H) 65 - 99 mg/dL   No results found.   Orlie Dakin, MD 11/19/15  [phone]

## 2015-11-19 NOTE — Progress Notes (Signed)
DAR NOTE: Pt has been in the room most of the am, but later came to the day room, interacted well with peers. Pt has  requested to be moved to 500 hall. As per self inventory, Pt had fair sleep, poor appetite, low energy, and poor concentration. Pt rates depression at 9, hopelessness at 9, and anxiety at 9. Pt goal for today is " medicine, housing, getting myself better." Pt provided with medications per providers orders. Pt's labs and vitals were monitored throughout the day. Pt supported emotionally and encouraged to express concerns and questions. Pt educated on medications. Pt's safety ensured with 15 minute and environmental checks. Pt currently denies HI and A/V hallucinations, having passive SI . Pt verbally agrees to seek staff if SI/HI or A/VH occurs and to consult with staff before acting on any harmful thoughts. Will continue POC.
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
## 2015-11-20 DIAGNOSIS — F1020 Alcohol dependence, uncomplicated: Secondary | ICD-10-CM

## 2015-11-20 DIAGNOSIS — F250 Schizoaffective disorder, bipolar type: Principal | ICD-10-CM

## 2015-11-20 DIAGNOSIS — F1220 Cannabis dependence, uncomplicated: Secondary | ICD-10-CM

## 2015-11-20 DIAGNOSIS — F17200 Nicotine dependence, unspecified, uncomplicated: Secondary | ICD-10-CM

## 2015-11-20 LAB — GLUCOSE, CAPILLARY
GLUCOSE-CAPILLARY: 89 mg/dL (ref 65–99)
Glucose-Capillary: 87 mg/dL (ref 65–99)
Glucose-Capillary: 104 mg/dL — ABNORMAL HIGH (ref 65–99)
Glucose-Capillary: 115 mg/dL — ABNORMAL HIGH (ref 65–99)

## 2015-11-20 MED ORDER — LORAZEPAM 1 MG PO TABS
1.0000 mg | ORAL_TABLET | Freq: Four times a day (QID) | ORAL | Status: DC | PRN
Start: 2015-11-20 — End: 2015-11-29
  Administered 2015-11-21 – 2015-11-27 (×15): 1 mg via ORAL
  Filled 2015-11-20 (×16): qty 1

## 2015-11-20 MED ORDER — GABAPENTIN 400 MG PO CAPS
400.0000 mg | ORAL_CAPSULE | Freq: Three times a day (TID) | ORAL | Status: DC
  Administered 2015-11-20 – 2015-11-29 (×28): 400 mg via ORAL
  Filled 2015-11-20: qty 21
  Filled 2015-11-20 (×5): qty 1
  Filled 2015-11-20: qty 21
  Filled 2015-11-20 (×2): qty 1
  Filled 2015-11-20: qty 21
  Filled 2015-11-20 (×2): qty 1
  Filled 2015-11-20: qty 21
  Filled 2015-11-20 (×5): qty 1
  Filled 2015-11-20: qty 21
  Filled 2015-11-20 (×7): qty 1
  Filled 2015-11-20: qty 21
  Filled 2015-11-20: qty 1
  Filled 2015-11-20: qty 21
  Filled 2015-11-20 (×6): qty 1

## 2015-11-20 MED ORDER — LORAZEPAM 2 MG/ML IJ SOLN: 1.0000 mg | Freq: Four times a day (QID) | INTRAMUSCULAR | Status: DC | PRN

## 2015-11-20 MED ORDER — ARIPIPRAZOLE ER 400 MG IM SUSR
400.0000 mg | INTRAMUSCULAR | Status: DC
  Administered 2015-11-21: 400 mg via INTRAMUSCULAR

## 2015-11-20 NOTE — Progress Notes (Signed)
Recreation Therapy Notes  Animal-Assisted Activity (AAA) Program Checklist/Progress Notes Patient Eligibility Criteria Checklist & Daily Group note for Rec Tx Intervention  Date: 06.06.2017 Time: 2:45pm Location: [address]    AAA/T Program Assumption of Risk Form signed by Patient/ or Parent Legal Guardian Yes  Patient is free of allergies or sever asthma Yes  Patient reports no fear of animals Yes  Patient reports no history of cruelty to animals Yes  Patient understands his/her participation is voluntary Yes  Behavioral Response: Did not attend.    Laureen Ochs Clever Geraldo, LRT/CTRS        Stephonie Wilcoxen L 11/20/2015 3:03 PM

## 2015-11-20 NOTE — Progress Notes (Addendum)
[facility] MD Progress Note  11/20/2015 2:16 PM Maureen Swanson  MRN:  [id number] Subjective:  Patient reports " I feel suicidal.'    Objective: Maureen Swanson is a  50 y.o. AA female , who has a hx of schizoaffective do , cocaine, alcohol, cannabis abuse, who voluntarily presents to [facility] with c/o SI w/ a plan and command Maureen Swanson. Maureen Swanson is well known to the [facility] unit .  Maureen Swanson today continues to be depressed and reports SI . Maureen Swanson also continues to report anxiety sx. Pt continues to ruminate about her stressors , continues to need a lot of encouragement . Maureen Swanson continues to want to get help with her substance abuse and is motivated to go to a substance abuse treatment program.She reports she relapsed on cocaine and wants to stay away from bad influence.     Principal Problem: Schizoaffective disorder, bipolar type ([place]) Diagnosis:   Patient Active Problem List   Diagnosis Date Noted  . Cannabis use disorder, moderate, dependence ([place]) [F12.20] 11/20/2015  . Homicidal ideation [R45.850]   . Tobacco use disorder [F17.200] 07/20/2015  . Diabetes mellitus ([place]) [E11.9] 07/20/2015  . Suicidal ideation [R45.851]   . Cocaine use disorder, moderate, dependence ([place]) [F14.20] 04/03/2015  . Schizoaffective disorder, bipolar type ([place]) [F25.0] 04/03/2015  . Alcohol use disorder, moderate, dependence ([place]) [F10.20] 04/03/2015  . Acute ischemic stroke ([place]) [I63.9] 12/08/2014  . Left-sided weakness [M62.89] 12/08/2014  . 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 Knee pain [M25.569] 10/17/2010  . MAMMOGRAM, ABNORMAL, RIGHT [R92.8] 08/05/2010  . Dyslipidemia [E78.5] 06/21/2010  . AMENORRHEA, SECONDARY [N91.2] 10/29/2009  . 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 GERD [K21.9] 10/11/2007  . Essential hypertension [I10] 02/26/2007   Total Time spent with patient: 25 minutes  Past Psychiatric History: as per H&P.  Past Medical History:  Past Medical History  Diagnosis Date  . High cholesterol    . Depression   . Gout   . Anxiety   . Bipolar 1 disorder ([place])   . GERD (gastroesophageal reflux disease)   . Substance abuse     crack cocaine  . Headache(784.0)     migraines  . TMJ (temporomandibular joint disorder)   . Asthma     daily and prn inhalers  . Arthritis     back and knees  . Gastric ulcer   . 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 Hypertension     under control, has been on med. x 12 yrs.  . Diabetes mellitus     diet-controlled  . CHF (congestive heart failure) [facility])     Past Surgical History  Procedure Laterality Date  . Knee arthroscopy w/ partial medial meniscectomy  05/01/2010    right  . Breast lumpectomy with needle localization  04/19/2012    Procedure: BREAST LUMPECTOMY WITH NEEDLE LOCALIZATION;  Surgeon: [name], MD;  Location: [place];  Service: General;  Laterality: Right;   Family History:  Family History  Problem Relation Age of Onset  . Diabetes Mother   . Breast cancer Mother   . Cancer Father   . Bipolar disorder Maternal Aunt   . Schizophrenia Maternal Grandfather   . Alcoholism Maternal Uncle    Family Psychiatric  History: See Above Social History: As per H&P. History  Alcohol Use  . 0.0 oz/week    Comment: reports she drinks 3 40 ounce beers daily     History  Drug Use  .  Yes  . Special: Cocaine    Comment: 09/28/2015    Social History   Social History  . Marital Status: Single    Spouse Name: N/A  . Number of Children: N/A  . Years of Education: N/A   Social History Main Topics  . Smoking status: Current Every Day Smoker -- 0.50 packs/day for 15 years    Types: Cigarettes    Last Attempt to Quit: 08/02/2012  . Smokeless tobacco: Never Used  . Alcohol Use: 0.0 oz/week     Comment: reports she drinks 3 40 ounce beers daily  . Drug Use: Yes    Special: Cocaine     Comment: 09/28/2015  . Sexual Activity: No   Other Topics Concern  . None   Social History Narrative    Additional Social History:                         Sleep: Fair   Appetite:  Fair  Current Medications: Current Facility-Administered Medications  Medication Dose Route Frequency Provider Last Rate Last Dose  . acetaminophen (TYLENOL) tablet 650 mg  650 mg Oral Q6H PRN Nanci Pina, FNP   650 mg at 11/19/15 1652  . alum & mag hydroxide-simeth (MAALOX/MYLANTA) 200-200-20 MG/5ML suspension 30 mL  30 mL Oral Q4H PRN Nanci Pina, FNP      . amLODipine (NORVASC) tablet 5 mg  5 mg Oral Daily Derrill Center, NP   5 mg at 11/20/15 0747  . [START ON 11/21/2015] ARIPiprazole SUSR 400 mg  400 mg Intramuscular Q28 days [name], MD      . atorvastatin (LIPITOR) tablet 40 mg  40 mg Oral QPM Derrill Center, NP   40 mg at 11/19/15 1653  . gabapentin (NEURONTIN) capsule 400 mg  400 mg Oral TID [name], MD   400 mg at 11/20/15 1215  . insulin aspart (novoLOG) injection 0-5 Units  0-5 Units Subcutaneous QHS [name], MD   0 Units at 11/19/15 2245  . insulin aspart (novoLOG) injection 0-9 Units  0-9 Units Subcutaneous TID WC [name], MD   1 Units at 11/19/15 0930  . insulin glargine (LANTUS) injection 30 Units  30 Units Subcutaneous QHS Derrill Center, NP   30 Units at 11/19/15 2116  . linagliptin (TRADJENTA) tablet 5 mg  5 mg Oral Daily Derrill Center, NP   5 mg at 11/20/15 0747  . loratadine (CLARITIN) tablet 10 mg  10 mg Oral Daily Derrill Center, NP   10 mg at 11/20/15 0747  . LORazepam (ATIVAN) tablet 1 mg  1 mg Oral Q6H PRN [name], MD       Or  . LORazepam (ATIVAN) injection 1 mg  1 mg Intramuscular Q6H PRN Betsey Sossamon, MD      . magnesium hydroxide (MILK OF MAGNESIA) suspension 30 mL  30 mL Oral Daily PRN Nanci Pina, FNP      . metFORMIN (GLUCOPHAGE) tablet 1,000 mg  1,000 mg Oral BID WC Derrill Center, NP   1,000 mg at 11/20/15 0747  . nicotine (NICODERM CQ - dosed in mg/24 hours) patch 21 mg  21 mg Transdermal Q0600 Nanci Pina, FNP    21 mg at 11/20/15 0748  . potassium chloride (K-DUR,KLOR-CON) CR tablet 10 mEq  10 mEq Oral TID [name], MD   10 mEq at 11/20/15 1215  . QUEtiapine (SEROQUEL) tablet 500 mg  500 mg Oral QHS Derrill Center, NP   500 mg at 11/19/15 2118    Lab Results:  Results for orders placed or performed during the hospital encounter of 11/19/15 (from the past 48 hour(s))  Glucose, capillary     Status: Abnormal   Collection Time: 11/19/15  6:05 AM  Result Value Ref Range   Glucose-Capillary 160 (H) 65 - 99 mg/dL   Comment 1 Notify RN   Glucose, capillary     Status: Abnormal   Collection Time: 11/19/15  8:59 AM  Result Value Ref Range   Glucose-Capillary 144 (H) 65 - 99 mg/dL   Comment 1 Notify RN    Comment 2 Document in Chart   Glucose, capillary     Status: None   Collection Time: 11/19/15 12:02 PM  Result Value Ref Range   Glucose-Capillary 76 65 - 99 mg/dL   Comment 1 Notify RN   Glucose, capillary     Status: Abnormal   Collection Time: 11/19/15  4:54 PM  Result Value Ref Range   Glucose-Capillary 108 (H) 65 - 99 mg/dL   Comment 1 Notify RN   Glucose, capillary     Status: None   Collection Time: 11/19/15  8:53 PM  Result Value Ref Range   Glucose-Capillary 98 65 - 99 mg/dL  Glucose, capillary     Status: None   Collection Time: 11/20/15  6:02 AM  Result Value Ref Range   Glucose-Capillary 89 65 - 99 mg/dL  Glucose, capillary     Status: None   Collection Time: 11/20/15 12:16 PM  Result Value Ref Range   Glucose-Capillary 87 65 - 99 mg/dL    Blood Alcohol level:  Lab Results  Component Value Date   ETH <5 11/18/2015   ETH <5 10/18/2015    Physical Findings: AIMS: Facial and Oral Movements Muscles of Facial Expression: None, normal Lips and Perioral Area: None, normal Jaw: None, normal Tongue: None, normal,Extremity Movements Upper (arms, wrists, hands, fingers): None, normal Lower (legs, knees, ankles, toes): None, normal, Trunk Movements Neck, shoulders,  hips: None, normal, Overall Severity Severity of abnormal movements (highest score from questions above): None, normal Incapacitation due to abnormal movements: None, normal Patient's awareness of abnormal movements (rate only patient's report): No Awareness, Dental Status Current problems with teeth and/or dentures?: No Does patient usually wear dentures?: No  CIWA:    COWS:     Musculoskeletal: Strength & Muscle Tone: within normal limits Gait & Station: normal Patient leans: N/A  Psychiatric Specialty Exam: Review of Systems  Psychiatric/Behavioral: Positive for depression, suicidal ideas, hallucinations and substance abuse. The patient is nervous/anxious and has insomnia.   All other systems reviewed and are negative.   Blood pressure 151/100, pulse 90, temperature 98.8 F (37.1 C), temperature source Oral, resp. rate 118, height 5\' 4"  (1.626 m), weight 96.163 kg (212 lb).Body mass index is 36.37 kg/(m^2).  General Appearance: Casual  Eye Contact::  Fair  Speech:  Clear and Coherent  Volume:  Normal  Mood:  Depressed   Affect:  Congruent  Thought Process:  Coherent  Orientation:  Full (Time, Place, and Person)  Thought Content:  Delusions, Hallucinations: Auditory, Paranoid Ideation and Rumination   Suicidal Thoughts:  Yes.  without intent/plan   Homicidal Thoughts:  No  Memory:  Immediate;   Fair Recent;   Fair Remote;   Fair  Judgement:  Fair  Insight:  Shallow  Psychomotor Activity:  Restlessness  Concentration:  Fair  Recall:  Fair  Fund of Knowledge:Fair  Language: Fair  Akathisia:  No  Handed:  Right  AIMS (if indicated):     Assets:  Communication Skills Desire for Improvement Resilience  ADL's:  Intact  Cognition: WNL  Sleep:  Number of Hours: 6.25     Treatment Plan Summary:Maureen Swanson is a  50 y.o.AA female , who has a hx of schizoaffective do , cocaine, alcohol, cannabis abuse, who voluntarily presents to [facility] with c/o SI w/ a plan and command AH.  Pt continues to be depressed and has SI. Will continue treatment.  Daily contact with patient to assess and evaluate symptoms and progress in treatment and Medication management   Continue Vistaril 25 mg every six hours PRN anxiety,  Will increase Gabapentin to 400 mg TID for anxiety/withdrawal  Abilify Maintena IM 400 mg - next dose -  11/21/15- repeat q28 days. Will continue  seroquel  500 mg po qhs for psychosis/augmenting the effect of Abilify . Will make available PRN medications as per agitation protocol. Restarted home medications where indicated. -Individual and substance abuse counseling CSW will continue to work on disposition .   Branon Sabine, MD 11/20/2015, 2:16 PM

## 2015-11-20 NOTE — Progress Notes (Signed)
D-  Patient complained of thoughts of self harm this shift, but was able to contract for safety.  Patient also complained of hearing voices that talk down to her and tell her to harm herself.  Patient reported that her significant other put her out and now she is homeless.  Patient reports feeling hopeless and depressed.   A- Assess patient for safety, offer medications as prescribed, engage patient in 1:1 staff talks  R-  Patient able to contract for safety.  Continue to monitor as prescribed.

## 2015-11-20 NOTE — [facility] Group Notes (Signed)
[facility] LCSW Group Therapy 11/20/2015 1:15 PM  Type of Therapy: Group Therapy- Feelings about Diagnosis  Participation Level: Minimal  Participation Quality:  N/A  Affect:  Flat  Cognitive: Alert and Oriented   Insight:  Unable to assess  Engagement in Therapy: Minimal  Modes of Intervention: Clarification, Confrontation, Discussion, Education, Exploration, Limit-setting, Orientation, Problem-solving, Rapport Building, Art therapist, Socialization and Support  Description of Group:   This group will allow patients to explore their thoughts and feelings about diagnoses they have received. Patients will be guided to explore their level of understanding and acceptance of these diagnoses. Facilitator will encourage patients to process their thoughts and feelings about the reactions of others to their diagnosis, and will guide patients in identifying ways to discuss their diagnosis with significant others in their lives. This group will be process-oriented, with patients participating in exploration of their own experiences as well as giving and receiving support and challenge from other group members.  Summary of Progress/Problems:  Pt did not participate in group discussion, despite prompting.  Therapeutic Modalities:   Cognitive Behavioral Therapy Solution Focused Therapy Motivational Interviewing Relapse Prevention Therapy  Peri Maris, LCSWA 11/20/2015 3:48 PM

## 2015-11-20 NOTE — Progress Notes (Signed)
Adult Psychoeducational Group Note  Date:  11/20/2015 Time:  8:57 PM  Group Topic/Focus:  Wrap-Up Group:   The focus of this group is to help patients review their daily goal of treatment and discuss progress on daily workbooks.  Participation Level:  Active  Participation Quality:  Appropriate  Affect:  Appropriate  Cognitive:  Alert  Insight: Appropriate  Engagement in Group:  Engaged  Modes of Intervention:  Discussion  Additional Comments:  Patient states, "my day was fair". Patient goal is to work on getting stress level and depression under control.  Adline Kirshenbaum L Semone Orlov 11/20/2015, 8:57 PM

## 2015-11-21 LAB — GLUCOSE, CAPILLARY
Glucose-Capillary: 76 mg/dL (ref 65–99)
Glucose-Capillary: 97 mg/dL (ref 65–99)
Glucose-Capillary: 120 mg/dL — ABNORMAL HIGH (ref 65–99)
Glucose-Capillary: 117 mg/dL — ABNORMAL HIGH (ref 65–99)

## 2015-11-21 LAB — HEMOGLOBIN A1C
Hgb A1c MFr Bld: 5.1 % (ref 4.8–5.6)
MEAN PLASMA GLUCOSE: 100 mg/dL

## 2015-11-21 MED ORDER — ATENOLOL 50 MG PO TABS
100.0000 mg | ORAL_TABLET | Freq: Every day | ORAL | Status: DC
  Administered 2015-11-21 – 2015-11-29 (×9): 100 mg via ORAL
  Filled 2015-11-21 (×3): qty 1
  Filled 2015-11-21: qty 2
  Filled 2015-11-21: qty 1
  Filled 2015-11-21: qty 14
  Filled 2015-11-21 (×6): qty 1

## 2015-11-21 MED ORDER — FLUOXETINE HCL 20 MG PO CAPS
20.0000 mg | ORAL_CAPSULE | Freq: Every day | ORAL | Status: DC
  Administered 2015-11-21 – 2015-11-23 (×3): 20 mg via ORAL
  Filled 2015-11-21 (×5): qty 1

## 2015-11-21 NOTE — Progress Notes (Signed)
D-  Patient endorses auditory hallucinations, suicidal ideations and homicidal ideations toward her ex-boyfriends and random other unidentifiable people.  Patient stated that the voices are telling her to jump in front of a train, to fall off of bridges and that she is not worthy of living.  Patient complained of pain in her feet and thinks that her neurontin needs to be increased. Patient has had no incidents of behavioral dyscontrol this shift.   A-  Assess patient for safety, offer medications as prescribed, engage patient in 1:1 staff talks.   R-  Continue to monitor for safety, patient able to contract for safety.

## 2015-11-21 NOTE — [facility] Group Notes (Signed)
[facility] [employer] Group Therapy  11/21/2015 , 10:55 AM    Type of Therapy:  Mental Health Association Presentation  Participation Level:  Active  Participation Quality:  Attentive  Affect:  Blunted  Cognitive:  Oriented  Insight:  Limited  Engagement in Therapy:  Engaged  Modes of Intervention:  Discussion, Education and Socialization  Summary of Progress/Problems:  Maureen Swanson from [place] came to present his recovery story and play the guitar.  Stayed the entire time, engaged throughout.  [name] 11/21/2015 , 10:55 AM

## 2015-11-21 NOTE — Progress Notes (Signed)
   D: Pt was quiet, but pleasant during the assessment. Informed the writer that she was moved from [address] [employer]. Pt asked for a band-aid. When asked what she needed it for pt showed the writer her thumb on the right hand. Informed pt that info would be given to relieving shift.  Pt has no other questions or concerns.   A:  Support and encouragement was offered. 15 min checks continued for safety.  R: Pt remains safe.

## 2015-11-21 NOTE — Progress Notes (Signed)
Patient attended group. Patient says that she is working on being happy and feels that she has been successful at that. Patient says that she needs to be less depressed.

## 2015-11-21 NOTE — [facility] Group Notes (Signed)
[facility] LCSW Aftercare Discharge Planning Group Note   11/21/2015 10:51 AM  Participation Quality:  Minimsal  Mood/Affect:  Flat  Depression Rating:  8  Anxiety Rating:  8  Thoughts of Suicide:  No Will you contract for safety?   NA  Current AVH:  Yes  Plan for Discharge/Comments:  Met with [facility].  Was able to say what services they are able to provide for her, and needs to check in with them when she discharges.  Confirmed that she is hoping to get into [facility], and that she wants to work with an ACT team, either [facility] or [facility].  "Whichever one will take me first."  Transportation Means:   Supports:  ,  B   

## 2015-11-21 NOTE — Progress Notes (Signed)
[facility] MD Progress Note  11/21/2015 2:49 PM Maureen OESTREICHER  MRN:  [id number] Subjective:  Patient reports " I woke up last night hearing voices asking me to kill myself and I felt I should."    Objective: Maureen Swanson is a  50 y.o. AA female , who has a hx of schizoaffective do , cocaine, alcohol, cannabis abuse, who voluntarily presents to [facility] with c/o SI w/ a plan and command AH. Maureen Swanson is well known to the [facility] unit .  Maureen Swanson today continues to be depressed and reports SI .Maureen Swanson also reports a time last night when she woke up hearing voices asking her to just kill herself. Maureen Swanson this AM is feeling a little better , but continues to have ongoing anxiety sx.  Discussed that her Neurontin has been increased to address that. For Maureen Swanson even though she has a primary psychopathology , her substance abuse problems has been causing her a lot of additional stress and almost always causes her to be noncompliant on her medications and decompensate .Maureen Swanson is very motivated to get help with her cocaine abuse and wants to go to a substance abuse facility from here.      Principal Problem: Schizoaffective disorder, bipolar type ([employer]) Diagnosis:   Patient Active Problem List   Diagnosis Date Noted  . Cannabis use disorder, moderate, dependence ([place]) [F12.20] 11/20/2015  . Homicidal ideation [R45.850]   . Tobacco use disorder [F17.200] 07/20/2015  . Diabetes mellitus ([place]) [E11.9] 07/20/2015  . Suicidal ideation [R45.851]   . Cocaine use disorder, moderate, dependence ([place]) [F14.20] 04/03/2015  . Schizoaffective disorder, bipolar type ([place]) [F25.0] 04/03/2015  . Alcohol use disorder, moderate, dependence ([place]) [F10.20] 04/03/2015  . Acute ischemic stroke ([place]) [I63.9] 12/08/2014  . Left-sided weakness [M62.89] 12/08/2014  . 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 Knee pain [M25.569] 10/17/2010  . MAMMOGRAM, ABNORMAL, RIGHT [R92.8] 08/05/2010  . Dyslipidemia [E78.5] 06/21/2010  .  AMENORRHEA, SECONDARY [N91.2] 10/29/2009  . 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 GERD [K21.9] 10/11/2007  . Essential hypertension [I10] 02/26/2007   Total Time spent with patient: 25 minutes  Past Psychiatric History: as per H&P.  Past Medical History:  Past Medical History  Diagnosis Date  . High cholesterol   . Depression   . Gout   . Anxiety   . Bipolar 1 disorder ([place])   . GERD (gastroesophageal reflux disease)   . Substance abuse     crack cocaine  . Headache(784.0)     migraines  . TMJ (temporomandibular joint disorder)   . Asthma     daily and prn inhalers  . Arthritis     back and knees  . Gastric ulcer   . 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 Hypertension     under control, has been on med. x 12 yrs.  . Diabetes mellitus     diet-controlled  . CHF (congestive heart failure) [facility])     Past Surgical History  Procedure Laterality Date  . Knee arthroscopy w/ partial medial meniscectomy  05/01/2010    right  . Breast lumpectomy with needle localization  04/19/2012    Procedure: BREAST LUMPECTOMY WITH NEEDLE LOCALIZATION;  Surgeon: [name], MD;  Location: [place];  Service: General;  Laterality: Right;   Family History:  Family History  Problem Relation Age of Onset  . Diabetes Mother   . Breast cancer Mother   . Cancer Father   .  Bipolar disorder Maternal Aunt   . Schizophrenia Maternal Grandfather   . Alcoholism Maternal Uncle    Family Psychiatric  History: See Above Social History: As per H&P. History  Alcohol Use  . 0.0 oz/week    Comment: reports she drinks 3 40 ounce beers daily     History  Drug Use  . Yes  . Special: Cocaine    Comment: 09/28/2015    Social History   Social History  . Marital Status: Single    Spouse Name: N/A  . Number of Children: N/A  . Years of Education: N/A   Social History Main Topics  . Smoking status: Current Every Day Smoker -- 0.50 packs/day for 15 years     Types: Cigarettes    Last Attempt to Quit: 08/02/2012  . Smokeless tobacco: Never Used  . Alcohol Use: 0.0 oz/week     Comment: reports she drinks 3 40 ounce beers daily  . Drug Use: Yes    Special: Cocaine     Comment: 09/28/2015  . Sexual Activity: No   Other Topics Concern  . None   Social History Narrative   Additional Social History:                         Sleep: restless   Appetite:  Fair  Current Medications: Current Facility-Administered Medications  Medication Dose Route Frequency Provider Last Rate Last Dose  . acetaminophen (TYLENOL) tablet 650 mg  650 mg Oral Q6H PRN Nanci Pina, FNP   650 mg at 11/21/15 1050  . alum & mag hydroxide-simeth (MAALOX/MYLANTA) 200-200-20 MG/5ML suspension 30 mL  30 mL Oral Q4H PRN Nanci Pina, FNP      . amLODipine (NORVASC) tablet 5 mg  5 mg Oral Daily Derrill Center, NP   5 mg at 11/21/15 0630  . ARIPiprazole SUSR 400 mg  400 mg Intramuscular Q28 days [name], MD   400 mg at 11/21/15 0830  . atenolol (TENORMIN) tablet 100 mg  100 mg Oral Daily Draysen Weygandt, MD   100 mg at 11/21/15 1104  . atorvastatin (LIPITOR) tablet 40 mg  40 mg Oral QPM Derrill Center, NP   40 mg at 11/20/15 1717  . FLUoxetine (PROZAC) capsule 20 mg  20 mg Oral Daily Henok Heacock, MD   20 mg at 11/21/15 1104  . gabapentin (NEURONTIN) capsule 400 mg  400 mg Oral TID [name], MD   400 mg at 11/21/15 1104  . insulin aspart (novoLOG) injection 0-5 Units  0-5 Units Subcutaneous QHS [name], MD   0 Units at 11/19/15 2245  . insulin aspart (novoLOG) injection 0-9 Units  0-9 Units Subcutaneous TID WC [name], MD   0 Units at 11/20/15 1719  . insulin glargine (LANTUS) injection 30 Units  30 Units Subcutaneous QHS Derrill Center, NP   30 Units at 11/20/15 2127  . linagliptin (TRADJENTA) tablet 5 mg  5 mg Oral Daily Derrill Center, NP   5 mg at 11/21/15 0830  . loratadine (CLARITIN) tablet 10 mg  10 mg Oral Daily Derrill Center, NP   10 mg at 11/21/15 0830  . LORazepam (ATIVAN) tablet 1 mg  1 mg Oral Q6H PRN [name], MD   1 mg at 11/21/15 0630   Or  . LORazepam (ATIVAN) injection 1 mg  1 mg Intramuscular Q6H PRN [name], MD      . magnesium  hydroxide (MILK OF MAGNESIA) suspension 30 mL  30 mL Oral Daily PRN Nanci Pina, FNP      . metFORMIN (GLUCOPHAGE) tablet 1,000 mg  1,000 mg Oral BID WC Derrill Center, NP   1,000 mg at 11/21/15 0829  . nicotine (NICODERM CQ - dosed in mg/24 hours) patch 21 mg  21 mg Transdermal Q0600 Nanci Pina, FNP   21 mg at 11/21/15 0830  . QUEtiapine (SEROQUEL) tablet 500 mg  500 mg Oral QHS Derrill Center, NP   500 mg at 11/20/15 2132    Lab Results:  Results for orders placed or performed during the hospital encounter of 11/19/15 (from the past 48 hour(s))  Glucose, capillary     Status: Abnormal   Collection Time: 11/19/15  4:54 PM  Result Value Ref Range   Glucose-Capillary 108 (H) 65 - 99 mg/dL   Comment 1 Notify RN   Glucose, capillary     Status: None   Collection Time: 11/19/15  8:53 PM  Result Value Ref Range   Glucose-Capillary 98 65 - 99 mg/dL  Glucose, capillary     Status: None   Collection Time: 11/20/15  6:02 AM  Result Value Ref Range   Glucose-Capillary 89 65 - 99 mg/dL  Hemoglobin A1c     Status: None   Collection Time: 11/20/15  6:26 AM  Result Value Ref Range   Hgb A1c MFr Bld 5.1 4.8 - 5.6 %    Comment: (NOTE)         Pre-diabetes: 5.7 - 6.4         Diabetes: >6.4         Glycemic control for adults with diabetes: <7.0    Mean Plasma Glucose 100 mg/dL    Comment: (NOTE) Performed At: [facility] [address] [place], [place] [id number] Lindon Romp MD [id number] Performed at [facility]   Glucose, capillary     Status: None   Collection Time: 11/20/15 12:16 PM  Result Value Ref Range   Glucose-Capillary 87 65 - 99 mg/dL  Glucose, capillary     Status: Abnormal   Collection Time:  11/20/15  4:56 PM  Result Value Ref Range   Glucose-Capillary 104 (H) 65 - 99 mg/dL  Glucose, capillary     Status: Abnormal   Collection Time: 11/20/15  8:22 PM  Result Value Ref Range   Glucose-Capillary 115 (H) 65 - 99 mg/dL  Glucose, capillary     Status: None   Collection Time: 11/21/15  6:17 AM  Result Value Ref Range   Glucose-Capillary 76 65 - 99 mg/dL   Comment 1 Notify RN   Glucose, capillary     Status: None   Collection Time: 11/21/15 11:40 AM  Result Value Ref Range   Glucose-Capillary 97 65 - 99 mg/dL   Comment 1 Notify RN    Comment 2 Document in Chart     Blood Alcohol level:  Lab Results  Component Value Date   ETH <5 11/18/2015   ETH <5 10/18/2015    Physical Findings: AIMS: Facial and Oral Movements Muscles of Facial Expression: None, normal Lips and Perioral Area: None, normal Jaw: None, normal Tongue: None, normal,Extremity Movements Upper (arms, wrists, hands, fingers): None, normal Lower (legs, knees, ankles, toes): None, normal, Trunk Movements Neck, shoulders, hips: None, normal, Overall Severity Severity of abnormal movements (highest score from questions above): None, normal Incapacitation due to abnormal movements: None, normal Patient's awareness of abnormal movements (  rate only patient's report): No Awareness, Dental Status Current problems with teeth and/or dentures?: No Does patient usually wear dentures?: No  CIWA:    COWS:     Musculoskeletal: Strength & Muscle Tone: within normal limits Gait & Station: normal Patient leans: N/A  Psychiatric Specialty Exam: Review of Systems  Psychiatric/Behavioral: Positive for depression, suicidal ideas, hallucinations and substance abuse. The patient is nervous/anxious and has insomnia.   All other systems reviewed and are negative.   Blood pressure 161/110, pulse 91, temperature 98.5 F (36.9 C), temperature source Oral, resp. rate 18, height 5\' 4"  (1.626 m), weight 96.163 kg (212 lb).Body  mass index is 36.37 kg/(m^2).  General Appearance: Casual  Eye Contact::  Fair  Speech:  Clear and Coherent  Volume:  Normal  Mood:  Depressed   Affect:  Congruent  Thought Process:  Coherent  Orientation:  Full (Time, Place, and Person)  Thought Content:  Delusions, Hallucinations: Auditory, Paranoid Ideation and Rumination   Suicidal Thoughts:  Yes.  without intent/plan   Homicidal Thoughts:  No  Memory:  Immediate;   Fair Recent;   Fair Remote;   Fair  Judgement:  Fair  Insight:  Shallow  Psychomotor Activity:  Restlessness  Concentration:  Fair  Recall:  [employer] of Knowledge:Fair  Language: Fair  Akathisia:  No  Handed:  Right  AIMS (if indicated):     Assets:  Communication Skills Desire for Improvement Resilience  ADL's:  Intact  Cognition: WNL  Sleep:  Number of Hours: 5.5     Treatment Plan Summary:Manon Jerilynn Mages Mole is a  50 y.o.AA female , who has a hx of schizoaffective do , cocaine, alcohol, cannabis abuse, who voluntarily presents to [facility] with c/o SI w/ a plan and command AH. Pt continues to be depressed, although progressing  . Will continue treatment.  Daily contact with patient to assess and evaluate symptoms and progress in treatment and Medication management   Continue Vistaril 25 mg every six hours PRN anxiety,  Increased Gabapentin to 400 mg TID for anxiety/withdrawal  Abilify Maintena IM 400 mg - next dose -today -   11/21/15- repeat q28 days. Will continue  seroquel  500 mg po qhs for psychosis/augmenting the effect of Abilify . Will make available PRN medications as per agitation protocol. Restarted home medications where indicated. -Individual and substance abuse counseling CSW will continue to work on disposition .Pt is very motivated to go to a substance abuse program.   Hagen Tidd, MD 11/21/2015, 2:49 PM

## 2015-11-22 LAB — GLUCOSE, CAPILLARY
GLUCOSE-CAPILLARY: 108 mg/dL — AB (ref 65–99)
GLUCOSE-CAPILLARY: 118 mg/dL — AB (ref 65–99)
Glucose-Capillary: 78 mg/dL (ref 65–99)
Glucose-Capillary: 128 mg/dL — ABNORMAL HIGH (ref 65–99)

## 2015-11-22 MED ORDER — QUETIAPINE FUMARATE 300 MG PO TABS
600.0000 mg | ORAL_TABLET | Freq: Every day | ORAL | Status: DC
  Administered 2015-11-22 – 2015-11-28 (×7): 600 mg via ORAL
  Filled 2015-11-22 (×4): qty 2
  Filled 2015-11-22: qty 14
  Filled 2015-11-22: qty 2
  Filled 2015-11-22: qty 14
  Filled 2015-11-22 (×2): qty 2
  Filled 2015-11-22: qty 14
  Filled 2015-11-22: qty 2

## 2015-11-22 NOTE — Tx Team (Signed)
Interdisciplinary Treatment Plan Update (Adult) Date: 11/22/2015   Date: 11/22/2015 12:07 PM  Progress in Treatment:  Attending groups: Intermittently Participating in groups: Minimally   Taking medication as prescribed: Yes  Tolerating medication: Yes  Family/Significant othe contact made: No Patient understands diagnosis: Yes AEB seeking help with depression and AVH Discussing patient identified problems/goals with staff: Yes  Medical problems stabilized or resolved: Yes  Denies suicidal/homicidal ideation: No Pt recently admitted with SI Patient has not harmed self or Others: Yes   New problem(s) identified: None identified at this time.   Discharge Plan or Barriers: see below Additional comments: Pt continues to be depressed, although progressing .  Continue Vistaril 25 mg every six hours PRN anxiety,  Increased Gabapentin to 400 mg TID for anxiety/withdrawal Abilify Maintena IM 400 mg - next dose -today - 11/21/15- repeat q28 days. Will increase seroquel to 600 mg po qhs for psychosis/augmenting the effect of Abilify .   Reason for Continuation of Hospitalization:  Depression Medication stabilization Suicidal ideation Auditory hallucinations  Estimated length of stay: 3-5 days  Review of initial/current patient goals per problem list:   1.  Goal(s): Patient will participate in aftercare plan  Met:  Yes  Target date: 3-5 days from date of admission   As evidenced by: Patient will participate within aftercare plan AEB aftercare provider and housing plan at discharge being identified.  11/19/15: CSW to work with Pt to assess for appropriate discharge plan and faciliate appointments and referrals as needed prior to d/c. 11/22/15:  Will d/c to shelter, follow up with ACT team  2.  Goal (s): Patient will exhibit decreased depressive symptoms and suicidal ideations.  Met:  No  Target date: 3-5 days from date of admission   As evidenced by: Patient will utilize self  rating of depression at 3 or below and demonstrate decreased signs of depression or be deemed stable for discharge by MD. 11/19/15: Pt was admitted with symptoms of depression, rating 10/10. Pt continues to present with flat affect and depressive symptoms.  Pt will demonstrate decreased symptoms of depression and rate depression at 3/10 or lower prior to discharge. 11/22/15: Rates depression a 7 today  5.  Goal(s): Patient will demonstrate decreased signs of psychosis  . Met:  No . Target date: 3-5 days from date of admission  . As evidenced by: Patient will demonstrate decreased frequency of AVH or return to baseline function   -11/19/15: Pt recently admitted with auditory hallucinations and continues to endorse those   11/22/15:  Symptoms persist Attendees:  Patient:    Family:    Physician: Dr. Parke Poisson, MD  11/22/2015 12:07 PM  Nursing: Lars Pinks, RN Case manager  11/22/2015 12:07 PM  Clinical Social Worker Rod Gadsden  11/22/2015 12:07 PM  Other:  11/22/2015 12:07 PM  Clinical: Eulogio Bear, RN 11/22/2015 12:07 PM  Other: , RN Charge Nurse 11/22/2015 12:07 PM  Other:

## 2015-11-22 NOTE — Progress Notes (Signed)
Patient did not attend karaoke group tonight. 

## 2015-11-22 NOTE — [facility] Group Notes (Signed)
[facility] Group Notes:  (Nursing/MHT/Case Management/Adjunct)  Date:  11/22/2015  Time:  10:29 AM  Type of Therapy:  Nurse Education  Participation Level:  Did Not Attend   Mart Piggs 11/22/2015, 10:29 AM

## 2015-11-22 NOTE — [facility] Group Notes (Signed)
[facility] Group Notes:  (Counselor/Nursing/MHT/Case Management/Adjunct)  11/22/2015 1:15PM  Type of Therapy:  Group Therapy  Participation Level:  Active  Participation Quality:  Appropriate  Affect:  Flat  Cognitive:  Oriented  Insight:  Improving  Engagement in Group:  Limited  Engagement in Therapy:  Limited  Modes of Intervention:  Discussion, Exploration and Socialization  Summary of Progress/Problems: The topic for group was balance in life.  Pt participated in the discussion about when their life was in balance and out of balance and how this feels.  Pt discussed ways to get back in balance and short term goals they can work on to get where they want to be.  Invited.  In bed asleep.  Did not respond.  [name] 11/22/2015 12:56 PM

## 2015-11-22 NOTE — Progress Notes (Signed)
[facility] MD Progress Note  11/22/2015 11:15 AM Maureen Swanson  MRN:  [id number] Subjective:  Patient reports " I did not sleep at all last night. I called my son and he was mad at me and asked me to never call him. I felt so bad and kept thinking about may I should just end it all. I also started hearing all these negative voices asking me to give up."      Objective: Maureen Swanson is a  50 y.o. AA female , who has a hx of schizoaffective do , cocaine, alcohol, cannabis abuse, who voluntarily presents to [facility] with c/o SI w/ a plan and command AH. [name] is well known to the [facility] unit .  [name] has been to [facility] several times and majority of the admission have been due to relapse on cocaine which inturn leads her to have worsening sx of her schizoaffective do . [name] continues to be motivated to get help and wants to go to a substance abuse program from here . She continues to have several psychosocial stressors like relational issues with son, his wife and also ex boyfriend. [name] continues to struggle with anxiety and depressive sx - her medications are helping and she is progressing- but she periods when she continues to have trouble coping . Per staff - [name] has been taking her medications ,denies ADRs. Continues to need encouragement and support.    Principal Problem: Schizoaffective disorder, bipolar type ([place]) Diagnosis:   Patient Active Problem List   Diagnosis Date Noted  . Cannabis use disorder, moderate, dependence ([place]) [F12.20] 11/20/2015  . Homicidal ideation [R45.850]   . Tobacco use disorder [F17.200] 07/20/2015  . Diabetes mellitus ([place]) [E11.9] 07/20/2015  . Suicidal ideation [R45.851]   . Cocaine use disorder, moderate, dependence ([place]) [F14.20] 04/03/2015  . Schizoaffective disorder, bipolar type ([place]) [F25.0] 04/03/2015  . Alcohol use disorder, moderate, dependence ([place]) [F10.20] 04/03/2015  . Acute ischemic stroke ([place]) [I63.9] 12/08/2014  . Left-sided weakness [M62.89]  12/08/2014  . 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 Knee pain [M25.569] 10/17/2010  . MAMMOGRAM, ABNORMAL, RIGHT [R92.8] 08/05/2010  . Dyslipidemia [E78.5] 06/21/2010  . AMENORRHEA, SECONDARY [N91.2] 10/29/2009  . 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 GERD [K21.9] 10/11/2007  . Essential hypertension [I10] 02/26/2007   Total Time spent with patient: 25 minutes  Past Psychiatric History: as per H&P.  Past Medical History:  Past Medical History  Diagnosis Date  . High cholesterol   . Depression   . Gout   . Anxiety   . Bipolar 1 disorder ([place])   . GERD (gastroesophageal reflux disease)   . Substance abuse     crack cocaine  . Headache(784.0)     migraines  . TMJ (temporomandibular joint disorder)   . Asthma     daily and prn inhalers  . Arthritis     back and knees  . Gastric ulcer   . 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 Hypertension     under control, has been on med. x 12 yrs.  . Diabetes mellitus     diet-controlled  . CHF (congestive heart failure) [facility])     Past Surgical History  Procedure Laterality Date  . Knee arthroscopy w/ partial medial meniscectomy  05/01/2010    right  . Breast lumpectomy with needle localization  04/19/2012    Procedure: BREAST LUMPECTOMY WITH NEEDLE LOCALIZATION;  Surgeon: [name], MD;  Location: [facility];  Service: General;  Laterality: Right;   Family History:  Family History  Problem Relation Age of Onset  . Diabetes Mother   . Breast cancer Mother   . Cancer Father   . Bipolar disorder Maternal Aunt   . Schizophrenia Maternal Grandfather   . Alcoholism Maternal Uncle    Family Psychiatric  History: See Above Social History: As per H&P. History  Alcohol Use  . 0.0 oz/week    Comment: reports she drinks 3 40 ounce beers daily     History  Drug Use  . Yes  . Special: Cocaine    Comment: 09/28/2015    Social History   Social History  . Marital Status:  Single    Spouse Name: N/A  . Number of Children: N/A  . Years of Education: N/A   Social History Main Topics  . Smoking status: Current Every Day Smoker -- 0.50 packs/day for 15 years    Types: Cigarettes    Last Attempt to Quit: 08/02/2012  . Smokeless tobacco: Never Used  . Alcohol Use: 0.0 oz/week     Comment: reports she drinks 3 40 ounce beers daily  . Drug Use: Yes    Special: Cocaine     Comment: 09/28/2015  . Sexual Activity: No   Other Topics Concern  . None   Social History Narrative   Additional Social History:                         Sleep: restless   Appetite:  Fair  Current Medications: Current Facility-Administered Medications  Medication Dose Route Frequency Provider Last Rate Last Dose  . acetaminophen (TYLENOL) tablet 650 mg  650 mg Oral Q6H PRN Nanci Pina, FNP   650 mg at 11/22/15 1024  . alum & mag hydroxide-simeth (MAALOX/MYLANTA) 200-200-20 MG/5ML suspension 30 mL  30 mL Oral Q4H PRN Nanci Pina, FNP      . amLODipine (NORVASC) tablet 5 mg  5 mg Oral Daily Derrill Center, NP   5 mg at 11/22/15 0653  . ARIPiprazole SUSR 400 mg  400 mg Intramuscular Q28 days [name], MD   400 mg at 11/21/15 0830  . atenolol (TENORMIN) tablet 100 mg  100 mg Oral Daily [name], MD   100 mg at 11/22/15 0727  . atorvastatin (LIPITOR) tablet 40 mg  40 mg Oral QPM Derrill Center, NP   40 mg at 11/21/15 1825  . FLUoxetine (PROZAC) capsule 20 mg  20 mg Oral Daily [name], MD   20 mg at 11/22/15 0727  . gabapentin (NEURONTIN) capsule 400 mg  400 mg Oral TID [name], MD   400 mg at 11/22/15 0729  . insulin aspart (novoLOG) injection 0-5 Units  0-5 Units Subcutaneous QHS [name], MD   0 Units at 11/19/15 2245  . insulin aspart (novoLOG) injection 0-9 Units  0-9 Units Subcutaneous TID WC [name], MD   0 Units at 11/20/15 1719  . insulin glargine (LANTUS) injection 30 Units  30 Units Subcutaneous QHS Derrill Center, NP    30 Units at 11/21/15 2109  . linagliptin (TRADJENTA) tablet 5 mg  5 mg Oral Daily Derrill Center, NP   5 mg at 11/22/15 0731  . loratadine (CLARITIN) tablet 10 mg  10 mg Oral Daily Derrill Center, NP   10 mg at 11/22/15 0728  . LORazepam (ATIVAN) tablet 1 mg  1  mg Oral Q6H PRN [name], MD   1 mg at 11/22/15 1025   Or  . LORazepam (ATIVAN) injection 1 mg  1 mg Intramuscular Q6H PRN Kyesha Balla, MD      . magnesium hydroxide (MILK OF MAGNESIA) suspension 30 mL  30 mL Oral Daily PRN Nanci Pina, FNP      . metFORMIN (GLUCOPHAGE) tablet 1,000 mg  1,000 mg Oral BID WC Derrill Center, NP   1,000 mg at 11/22/15 0728  . nicotine (NICODERM CQ - dosed in mg/24 hours) patch 21 mg  21 mg Transdermal Q0600 Nanci Pina, FNP   21 mg at 11/22/15 0728  . QUEtiapine (SEROQUEL) tablet 600 mg  600 mg Oral QHS [name], MD        Lab Results:  Results for orders placed or performed during the hospital encounter of 11/19/15 (from the past 48 hour(s))  Glucose, capillary     Status: None   Collection Time: 11/20/15 12:16 PM  Result Value Ref Range   Glucose-Capillary 87 65 - 99 mg/dL  Glucose, capillary     Status: Abnormal   Collection Time: 11/20/15  4:56 PM  Result Value Ref Range   Glucose-Capillary 104 (H) 65 - 99 mg/dL  Glucose, capillary     Status: Abnormal   Collection Time: 11/20/15  8:22 PM  Result Value Ref Range   Glucose-Capillary 115 (H) 65 - 99 mg/dL  Glucose, capillary     Status: None   Collection Time: 11/21/15  6:17 AM  Result Value Ref Range   Glucose-Capillary 76 65 - 99 mg/dL   Comment 1 Notify RN   Glucose, capillary     Status: None   Collection Time: 11/21/15 11:40 AM  Result Value Ref Range   Glucose-Capillary 97 65 - 99 mg/dL   Comment 1 Notify RN    Comment 2 Document in Chart   Glucose, capillary     Status: Abnormal   Collection Time: 11/21/15  4:41 PM  Result Value Ref Range   Glucose-Capillary 120 (H) 65 - 99 mg/dL   Comment 1 Notify RN     Comment 2 Document in Chart   Glucose, capillary     Status: Abnormal   Collection Time: 11/21/15  8:45 PM  Result Value Ref Range   Glucose-Capillary 117 (H) 65 - 99 mg/dL   Comment 1 Notify RN   Glucose, capillary     Status: None   Collection Time: 11/22/15  6:09 AM  Result Value Ref Range   Glucose-Capillary 78 65 - 99 mg/dL   Comment 1 Notify RN     Blood Alcohol level:  Lab Results  Component Value Date   ETH <5 11/18/2015   ETH <5 10/18/2015    Physical Findings: AIMS: Facial and Oral Movements Muscles of Facial Expression: None, normal Lips and Perioral Area: None, normal Jaw: None, normal Tongue: None, normal,Extremity Movements Upper (arms, wrists, hands, fingers): None, normal Lower (legs, knees, ankles, toes): None, normal, Trunk Movements Neck, shoulders, hips: None, normal, Overall Severity Severity of abnormal movements (highest score from questions above): None, normal Incapacitation due to abnormal movements: None, normal Patient's awareness of abnormal movements (rate only patient's report): No Awareness, Dental Status Current problems with teeth and/or dentures?: No Does patient usually wear dentures?: No  CIWA:    COWS:     Musculoskeletal: Strength & Muscle Tone: within normal limits Gait & Station: normal Patient leans: N/A  Psychiatric Specialty Exam: Review of  Systems  Psychiatric/Behavioral: Positive for depression, suicidal ideas, hallucinations and substance abuse. The patient is nervous/anxious and has insomnia.   All other systems reviewed and are negative.   Blood pressure 157/98, pulse 79, temperature 98.9 F (37.2 C), temperature source Oral, resp. rate 20, height 5\' 4"  (1.626 m), weight 96.163 kg (212 lb).Body mass index is 36.37 kg/(m^2).  General Appearance: Casual  Eye Contact::  Fair  Speech:  Clear and Coherent  Volume:  Normal  Mood:  Depressed   Affect:  Congruent  Thought Process:  Coherent  Orientation:  Full (Time,  Place, and Person)  Thought Content:  Delusions, Hallucinations: Auditory, Paranoid Ideation and Rumination   Suicidal Thoughts:  Yes.  without intent/plan on and off   Homicidal Thoughts:  No  Memory:  Immediate;   Fair Recent;   Fair Remote;   Fair  Judgement:  Fair  Insight:  Shallow  Psychomotor Activity:  Restlessness  Concentration:  Fair  Recall:  [employer] of Knowledge:Fair  Language: Fair  Akathisia:  No  Handed:  Right  AIMS (if indicated):     Assets:  Communication Skills Desire for Improvement Resilience  ADL's:  Intact  Cognition: WNL  Sleep:  Number of Hours: 5.5     Treatment Plan Summary:Avaeh Jerilynn Mages Afifi is a  50 y.o.AA female , who has a hx of schizoaffective do , cocaine, alcohol, cannabis abuse, who voluntarily presents to [facility] with c/o SI w/ a plan and command AH. Pt continues to be depressed, although progressing  . Will continue treatment.  Daily contact with patient to assess and evaluate symptoms and progress in treatment and Medication management   Continue Vistaril 25 mg every six hours PRN anxiety,  Increased Gabapentin to 400 mg TID for anxiety/withdrawal  Abilify Maintena IM 400 mg - next dose -today -   11/21/15- repeat q28 days. Will increase seroquel to  600 mg po qhs for psychosis/augmenting the effect of Abilify . Will make available PRN medications as per agitation protocol. Restarted home medications where indicated. -Individual and substance abuse counseling. Recreational therapy consult. CSW will continue to work on disposition .Pt is very motivated to go to a substance abuse program.   Trejan Buda, MD 11/22/2015, 11:15 AM

## 2015-11-23 LAB — GLUCOSE, CAPILLARY
GLUCOSE-CAPILLARY: 106 mg/dL — AB (ref 65–99)
GLUCOSE-CAPILLARY: 129 mg/dL — AB (ref 65–99)
GLUCOSE-CAPILLARY: 111 mg/dL — AB (ref 65–99)
Glucose-Capillary: 111 mg/dL — ABNORMAL HIGH (ref 65–99)

## 2015-11-23 MED ORDER — FLUOXETINE HCL 10 MG PO CAPS
30.0000 mg | ORAL_CAPSULE | Freq: Every day | ORAL | Status: DC
  Administered 2015-11-24 – 2015-11-26 (×3): 30 mg via ORAL
  Filled 2015-11-23 (×5): qty 1

## 2015-11-23 NOTE — Progress Notes (Signed)
D: Pt A & O X 4. Visible in dayroom for majority of this shift. Presents with flat affect and depressed mood on approach. Pt is guarded, minimal interactions noted with peers or staff on as need basis. Reported fair sleep and fair appetite. Rates her depression, anxiety and hopelessness all at 8/10 on self inventory sheet. Pt's goal for today "To get my life back on a good day today, stop feeling worthless". Pt was tearful this evening, stated she's worried about her current situation related to housing.  A: Scheduled and PRN (Ativan) medications administered as prescribed. Emotional support, availability and encouragement provided to pt. Q 15 minutes checks maintained on and off unit without self harm gestures or outburst to report at this time.  R: Pt denies SI, HI, VH and pain when assessed. Endorsed AH which are not commanding in nature; stated to Probation officer "I do hear voices though". Pt attended scheduled groups. Compliant with medications as ordered. Denies adverse drug reactions at present. POC continues.

## 2015-11-23 NOTE — Progress Notes (Signed)
D: Pt passive SI-contracts for safety. Pt is pleasant and cooperative. Pt did not go to [employer].pt stayed in room majority of the evening  A: Pt was offered support and encouragement. Pt was given scheduled medications. Pt was encourage to attend groups. Q 15 minute checks were done for safety.   R: Pt is taking medication. Pt has no complaints.Pt receptive to treatment and safety maintained on unit.

## 2015-11-23 NOTE — B[facility] Group Notes (Signed)
[place] LCSW Group Therapy  11/23/2015  1:05 PM  Type of Therapy:  Group therapy  Participation Level:  Active  Participation Quality:  Attentive  Affect:  Flat  Cognitive:  Oriented  Insight:  Limited  Engagement in Therapy:  Limited  Modes of Intervention:  Discussion, Socialization  Summary of Progress/Problems:  Chaplain was here to lead a group on themes of hope and courage.  Invited.  In bed asleep.  [name] 11/23/2015 1:47 PM

## 2015-11-23 NOTE — Progress Notes (Signed)
Adult Psychoeducational Group Note  Date:  11/23/2015 Time:  8:41 PM  Group Topic/Focus:  Wrap-Up Group:   The focus of this group is to help patients review their daily goal of treatment and discuss progress on daily workbooks.  Participation Level:  Active  Participation Quality:  Appropriate  Affect:  Appropriate  Cognitive:  Appropriate  Insight: Appropriate  Engagement in Group:  Engaged  Modes of Intervention:  Discussion  Additional Comments:The patient expressed that she rates today a 7.The patient also said that she attended groups.  Nash Shearer 11/23/2015, 8:41 PM

## 2015-11-23 NOTE — Progress Notes (Signed)
[facility] MD Progress Note  11/23/2015 1:43 PM Maureen Swanson  MRN:  [id number] Subjective:  Patient reports " My mind is racing, I feel depressed and suicidal'.       Objective: Maureen Swanson is a  50 y.o. AA female , who has a hx of schizoaffective do , cocaine, alcohol, cannabis abuse, who voluntarily presents to [facility] with c/o SI w/ a plan and command AH. Maureen Swanson is well known to the [facility] unit .  Maureen Swanson has been to [facility] several times and majority of the admission have been due to relapse on cocaine which inturn leads her to have worsening sx of her schizoaffective do . Maureen Swanson continues to be motivated to get help and wants to go to a substance abuse program from here . Maureen Swanson today continues to have depressive sx , racing thoughts and reports SI - contracts for safety while on the unit. Per staff - Maureen Swanson has been taking her medications ,denies ADRs. Continues to need encouragement and support.    Principal Problem: Schizoaffective disorder, bipolar type ([place]) Diagnosis:   Patient Active Problem List   Diagnosis Date Noted  . Cannabis use disorder, moderate, dependence ([employer]) [F12.20] 11/20/2015  . Homicidal ideation [R45.850]   . Tobacco use disorder [F17.200] 07/20/2015  . Diabetes mellitus ([place]) [E11.9] 07/20/2015  . Suicidal ideation [R45.851]   . Cocaine use disorder, moderate, dependence ([place]) [F14.20] 04/03/2015  . Schizoaffective disorder, bipolar type ([place]) [F25.0] 04/03/2015  . Alcohol use disorder, moderate, dependence ([place]) [F10.20] 04/03/2015  . Acute ischemic stroke ([place]) [I63.9] 12/08/2014  . Left-sided weakness [M62.89] 12/08/2014  . 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 Knee pain [M25.569] 10/17/2010  . MAMMOGRAM, ABNORMAL, RIGHT [R92.8] 08/05/2010  . Dyslipidemia [E78.5] 06/21/2010  . AMENORRHEA, SECONDARY [N91.2] 10/29/2009  . 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 GERD [K21.9] 10/11/2007  . Essential hypertension [I10] 02/26/2007   Total Time spent with  patient: 25 minutes  Past Psychiatric History: as per H&P.  Past Medical History:  Past Medical History  Diagnosis Date  . High cholesterol   . Depression   . Gout   . Anxiety   . Bipolar 1 disorder ([place])   . GERD (gastroesophageal reflux disease)   . Substance abuse     crack cocaine  . Headache(784.0)     migraines  . TMJ (temporomandibular joint disorder)   . Asthma     daily and prn inhalers  . Arthritis     back and knees  . Gastric ulcer   . 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 Hypertension     under control, has been on med. x 12 yrs.  . Diabetes mellitus     diet-controlled  . CHF (congestive heart failure) [facility])     Past Surgical History  Procedure Laterality Date  . Knee arthroscopy w/ partial medial meniscectomy  05/01/2010    right  . Breast lumpectomy with needle localization  04/19/2012    Procedure: BREAST LUMPECTOMY WITH NEEDLE LOCALIZATION;  Surgeon: [name], MD;  Location: [place];  Service: General;  Laterality: Right;   Family History:  Family History  Problem Relation Age of Onset  . Diabetes Mother   . Breast cancer Mother   . Cancer Father   . Bipolar disorder Maternal Aunt   . Schizophrenia Maternal Grandfather   . Alcoholism Maternal Uncle    Family Psychiatric  History: See Above Social History: As per  H&P. History  Alcohol Use  . 0.0 oz/week    Comment: reports she drinks 3 40 ounce beers daily     History  Drug Use  . Yes  . Special: Cocaine    Comment: 09/28/2015    Social History   Social History  . Marital Status: Single    Spouse Name: N/A  . Number of Children: N/A  . Years of Education: N/A   Social History Main Topics  . Smoking status: Current Every Day Smoker -- 0.50 packs/day for 15 years    Types: Cigarettes    Last Attempt to Quit: 08/02/2012  . Smokeless tobacco: Never Used  . Alcohol Use: 0.0 oz/week     Comment: reports she drinks 3 40 ounce beers daily   . Drug Use: Yes    Special: Cocaine     Comment: 09/28/2015  . Sexual Activity: No   Other Topics Concern  . None   Social History Narrative   Additional Social History:                         Sleep: restless   Appetite:  Fair  Current Medications: Current Facility-Administered Medications  Medication Dose Route Frequency Provider Last Rate Last Dose  . acetaminophen (TYLENOL) tablet 650 mg  650 mg Oral Q6H PRN Nanci Pina, FNP   650 mg at 11/22/15 1024  . alum & mag hydroxide-simeth (MAALOX/MYLANTA) 200-200-20 MG/5ML suspension 30 mL  30 mL Oral Q4H PRN Nanci Pina, FNP      . amLODipine (NORVASC) tablet 5 mg  5 mg Oral Daily Derrill Center, NP   5 mg at 11/23/15 0820  . ARIPiprazole SUSR 400 mg  400 mg Intramuscular Q28 days [name], MD   400 mg at 11/21/15 0830  . atenolol (TENORMIN) tablet 100 mg  100 mg Oral Daily [name], MD   100 mg at 11/23/15 0819  . atorvastatin (LIPITOR) tablet 40 mg  40 mg Oral QPM Derrill Center, NP   40 mg at 11/22/15 1626  . FLUoxetine (PROZAC) capsule 20 mg  20 mg Oral Daily [name], MD   20 mg at 11/23/15 0819  . gabapentin (NEURONTIN) capsule 400 mg  400 mg Oral TID [name], MD   400 mg at 11/23/15 1208  . insulin aspart (novoLOG) injection 0-5 Units  0-5 Units Subcutaneous QHS [name], MD   0 Units at 11/19/15 2245  . insulin aspart (novoLOG) injection 0-9 Units  0-9 Units Subcutaneous TID WC [name], MD   0 Units at 11/20/15 1719  . insulin glargine (LANTUS) injection 30 Units  30 Units Subcutaneous QHS Derrill Center, NP   30 Units at 11/22/15 2111  . linagliptin (TRADJENTA) tablet 5 mg  5 mg Oral Daily Derrill Center, NP   5 mg at 11/23/15 0819  . loratadine (CLARITIN) tablet 10 mg  10 mg Oral Daily Derrill Center, NP   10 mg at 11/23/15 0821  . LORazepam (ATIVAN) tablet 1 mg  1 mg Oral Q6H PRN [name], MD   1 mg at 11/22/15 1627   Or  . LORazepam (ATIVAN) injection 1 mg   1 mg Intramuscular Q6H PRN Zyen Triggs, MD      . magnesium hydroxide (MILK OF MAGNESIA) suspension 30 mL  30 mL Oral Daily PRN Nanci Pina, FNP      . metFORMIN (GLUCOPHAGE) tablet 1,000 mg  1,000 mg Oral BID WC Derrill Center, NP   1,000 mg at 11/23/15 [id number]  . nicotine (NICODERM CQ - dosed in mg/24 hours) patch 21 mg  21 mg Transdermal Q0600 Nanci Pina, FNP   21 mg at 11/23/15 0823  . QUEtiapine (SEROQUEL) tablet 600 mg  600 mg Oral QHS [name], MD   600 mg at 11/22/15 2109    Lab Results:  Results for orders placed or performed during the hospital encounter of 11/19/15 (from the past 48 hour(s))  Glucose, capillary     Status: Abnormal   Collection Time: 11/21/15  4:41 PM  Result Value Ref Range   Glucose-Capillary 120 (H) 65 - 99 mg/dL   Comment 1 Notify RN    Comment 2 Document in Chart   Glucose, capillary     Status: Abnormal   Collection Time: 11/21/15  8:45 PM  Result Value Ref Range   Glucose-Capillary 117 (H) 65 - 99 mg/dL   Comment 1 Notify RN   Glucose, capillary     Status: None   Collection Time: 11/22/15  6:09 AM  Result Value Ref Range   Glucose-Capillary 78 65 - 99 mg/dL   Comment 1 Notify RN   Glucose, capillary     Status: Abnormal   Collection Time: 11/22/15 11:37 AM  Result Value Ref Range   Glucose-Capillary 108 (H) 65 - 99 mg/dL   Comment 1 Notify RN    Comment 2 Document in Chart   Glucose, capillary     Status: Abnormal   Collection Time: 11/22/15  4:45 PM  Result Value Ref Range   Glucose-Capillary 118 (H) 65 - 99 mg/dL   Comment 1 Notify RN    Comment 2 Document in Chart   Glucose, capillary     Status: Abnormal   Collection Time: 11/22/15  7:51 PM  Result Value Ref Range   Glucose-Capillary 128 (H) 65 - 99 mg/dL  Glucose, capillary     Status: Abnormal   Collection Time: 11/23/15  5:27 AM  Result Value Ref Range   Glucose-Capillary 106 (H) 65 - 99 mg/dL  Glucose, capillary     Status: Abnormal   Collection Time: 11/23/15  12:03 PM  Result Value Ref Range   Glucose-Capillary 111 (H) 65 - 99 mg/dL   Comment 1 Notify RN    Comment 2 Document in Chart     Blood Alcohol level:  Lab Results  Component Value Date   ETH <5 11/18/2015   ETH <5 10/18/2015    Physical Findings: AIMS: Facial and Oral Movements Muscles of Facial Expression: None, normal Lips and Perioral Area: None, normal Jaw: None, normal Tongue: None, normal,Extremity Movements Upper (arms, wrists, hands, fingers): None, normal Lower (legs, knees, ankles, toes): None, normal, Trunk Movements Neck, shoulders, hips: None, normal, Overall Severity Severity of abnormal movements (highest score from questions above): None, normal Incapacitation due to abnormal movements: None, normal Patient's awareness of abnormal movements (rate only patient's report): No Awareness, Dental Status Current problems with teeth and/or dentures?: No Does patient usually wear dentures?: No  CIWA:    COWS:     Musculoskeletal: Strength & Muscle Tone: within normal limits Gait & Station: normal Patient leans: N/A  Psychiatric Specialty Exam: Review of Systems  Psychiatric/Behavioral: Positive for depression, suicidal ideas, hallucinations and substance abuse. The patient is nervous/anxious and has insomnia.   All other systems reviewed and are negative.   Blood pressure 132/96, pulse 90, temperature 98.5 F (36.9 C),  temperature source Oral, resp. rate 18, height 5\' 4"  (1.626 m), weight 96.163 kg (212 lb).Body mass index is 36.37 kg/(m^2).  General Appearance: Casual  Eye Contact::  Fair  Speech:  Clear and Coherent  Volume:  Normal  Mood:  Depressed   Affect:  Congruent  Thought Process:  Coherent  Orientation:  Full (Time, Place, and Person)  Thought Content:  Delusions, Hallucinations: Auditory, Paranoid Ideation and Rumination   Suicidal Thoughts:  Yes.  without intent/plan on and off   Homicidal Thoughts:  No  Memory:  Immediate;   Fair Recent;    Fair Remote;   Fair  Judgement:  Fair  Insight:  Shallow  Psychomotor Activity:  Restlessness  Concentration:  Fair  Recall:  [employer] of Knowledge:Fair  Language: Fair  Akathisia:  No  Handed:  Right  AIMS (if indicated):     Assets:  Communication Skills Desire for Improvement Resilience  ADL's:  Intact  Cognition: WNL  Sleep:  Number of Hours: 5.75     Treatment Plan Summary:Maureen Swanson is a  50 y.o.AA female , who has a hx of schizoaffective do , cocaine, alcohol, cannabis abuse, who voluntarily presents to [facility] with c/o SI w/ a plan and command AH. Pt continues to be depressed, although progressing  . Will continue treatment.  Daily contact with patient to assess and evaluate symptoms and progress in treatment and Medication management   Continue Vistaril 25 mg every six hours PRN anxiety,  Increased Gabapentin to 400 mg TID for anxiety/withdrawal  Abilify Maintena IM 400 mg - next dose -today -   11/21/15- repeat q28 days. Increased seroquel to  600 mg po qhs for psychosis/augmenting the effect of Abilify . Patient reported feeling good on Prozac and hence Prozac was restarted on admission. Will increase to Prozac 30 mg po daily for affective sx. Will make available PRN medications as per agitation protocol. Restarted home medications where indicated. -Individual and substance abuse counseling. Recreational therapy consult. CSW will continue to work on disposition .Pt is very motivated to go to a substance abuse program.Patient referred to [facility].   Talayla Doyel, MD 11/23/2015, 1:43 PM

## 2015-11-24 LAB — GLUCOSE, CAPILLARY
GLUCOSE-CAPILLARY: 88 mg/dL (ref 65–99)
Glucose-Capillary: 95 mg/dL (ref 65–99)
Glucose-Capillary: 103 mg/dL — ABNORMAL HIGH (ref 65–99)
Glucose-Capillary: 120 mg/dL — ABNORMAL HIGH (ref 65–99)

## 2015-11-24 MED ORDER — QUETIAPINE FUMARATE 50 MG PO TABS
50.0000 mg | ORAL_TABLET | Freq: Once | ORAL | Status: AC
  Administered 2015-11-24: 50 mg via ORAL
  Filled 2015-11-24 (×2): qty 1

## 2015-11-24 NOTE — Progress Notes (Signed)
D- Patient endorses auditory hallucinations, suicidal ideations and homicidal ideations toward her ex-boyfriends and random other unidentifiable people. Patient stated that the voices are telling her to jump in front of a train, to fall off of bridges and that she is not worthy of living. Patient complained of pain in her feet and thinks that her neurontin needs to be increased. Patient has had no incidents of behavioral dyscontrol this shift.   A- Assess patient for safety, offer medications as prescribed, engage patient in 1:1 staff talks.   R- Continue to monitor for safety, patient able to contract for safety.

## 2015-11-24 NOTE — Progress Notes (Signed)
[facility] MD Progress Note  11/24/2015 1:42 PM Maureen Swanson  MRN:  [id number]   Subjective:  Patient reports " I want to kill myself. I hope I get better, I hope so having thoughts of harming myself. Just life in general is so stressful. I ben trying to call my son but he wont answer me. He is a turck driver so he cant answer the phone all the time."   Objective: Maureen Swanson is a  50 y.o. AA female , who has a hx of schizoaffective do , cocaine, alcohol, cannabis abuse, who voluntarily presents to [facility] with c/o SI w/ a plan and command AH. Maureen Swanson has been to [facility] several times and majority of the admission have been due to relapse on cocaine which inturn leads her to have worsening sx of her schizoaffective do. Maureen Swanson is endorsing SI at this time, she is unable to identify what contributed to her feelings since yesterday. But notes that she woke up like this, and cant seem to shake it. Maureen Swanson today continues to have depressive sx , racing thoughts and reports SI - contracts for safety while on the unit. Per staff - Maureen Swanson has been taking her medications ,denies ADRs. Continues to need encouragement and support. After lengthy discussion with staff and writer, patient is observed up and ambulatory on the unit with no assistance of wheel chair or other mobility devices. She reports decreased sleep and appetite at thist time. She is unable to verbalize any goals at this time.   Principal Problem: Schizoaffective disorder, bipolar type ([place]) Diagnosis:   Patient Active Problem List   Diagnosis Date Noted  . Cannabis use disorder, moderate, dependence ([place]) [F12.20] 11/20/2015  . Homicidal ideation [R45.850]   . Tobacco use disorder [F17.200] 07/20/2015  . Diabetes mellitus ([place]) [E11.9] 07/20/2015  . Suicidal ideation [R45.851]   . Cocaine use disorder, moderate, dependence ([place]) [F14.20] 04/03/2015  . Schizoaffective disorder, bipolar type ([place]) [F25.0] 04/03/2015  . Alcohol use disorder, moderate, dependence  ([place]) [F10.20] 04/03/2015  . Acute ischemic stroke ([place]) [I63.9] 12/08/2014  . Left-sided weakness [M62.89] 12/08/2014  . 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 Knee pain [M25.569] 10/17/2010  . MAMMOGRAM, ABNORMAL, RIGHT [R92.8] 08/05/2010  . Dyslipidemia [E78.5] 06/21/2010  . AMENORRHEA, SECONDARY [N91.2] 10/29/2009  . 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 GERD [K21.9] 10/11/2007  . Essential hypertension [I10] 02/26/2007   Total Time spent with patient: 25 minutes  Past Psychiatric History: as per H&P.  Past Medical History:  Past Medical History  Diagnosis Date  . High cholesterol   . Depression   . Gout   . Anxiety   . Bipolar 1 disorder ([place])   . GERD (gastroesophageal reflux disease)   . Substance abuse     crack cocaine  . Headache(784.0)     migraines  . TMJ (temporomandibular joint disorder)   . Asthma     daily and prn inhalers  . Arthritis     back and knees  . Gastric ulcer   . 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 Hypertension     under control, has been on med. x 12 yrs.  . Diabetes mellitus     diet-controlled  . CHF (congestive heart failure) [facility])     Past Surgical History  Procedure Laterality Date  . Knee arthroscopy w/ partial medial meniscectomy  05/01/2010    right  . Breast lumpectomy with needle localization  04/19/2012  Procedure: BREAST LUMPECTOMY WITH NEEDLE LOCALIZATION;  Surgeon: [name], MD;  Location: [place];  Service: General;  Laterality: Right;   Family History:  Family History  Problem Relation Age of Onset  . Diabetes Mother   . Breast cancer Mother   . Cancer Father   . Bipolar disorder Maternal Aunt   . Schizophrenia Maternal Grandfather   . Alcoholism Maternal Uncle    Family Psychiatric  History: See Above Social History: As per H&P. History  Alcohol Use  . 0.0 oz/week    Comment: reports she drinks 3 40 ounce beers daily     History  Drug Use   . Yes  . Special: Cocaine    Comment: 09/28/2015    Social History   Social History  . Marital Status: Single    Spouse Name: N/A  . Number of Children: N/A  . Years of Education: N/A   Social History Main Topics  . Smoking status: Current Every Day Smoker -- 0.50 packs/day for 15 years    Types: Cigarettes    Last Attempt to Quit: 08/02/2012  . Smokeless tobacco: Never Used  . Alcohol Use: 0.0 oz/week     Comment: reports she drinks 3 40 ounce beers daily  . Drug Use: Yes    Special: Cocaine     Comment: 09/28/2015  . Sexual Activity: No   Other Topics Concern  . None   Social History Narrative   Additional Social History:    Sleep: restless   Appetite:  Fair  Current Medications: Current Facility-Administered Medications  Medication Dose Route Frequency Provider Last Rate Last Dose  . acetaminophen (TYLENOL) tablet 650 mg  650 mg Oral Q6H PRN Nanci Pina, FNP   650 mg at 11/24/15 1057  . alum & mag hydroxide-simeth (MAALOX/MYLANTA) 200-200-20 MG/5ML suspension 30 mL  30 mL Oral Q4H PRN Nanci Pina, FNP      . amLODipine (NORVASC) tablet 5 mg  5 mg Oral Daily Derrill Center, NP   5 mg at 11/24/15 0818  . ARIPiprazole SUSR 400 mg  400 mg Intramuscular Q28 days [name], MD   400 mg at 11/21/15 0830  . atenolol (TENORMIN) tablet 100 mg  100 mg Oral Daily [name], MD   100 mg at 11/24/15 0817  . atorvastatin (LIPITOR) tablet 40 mg  40 mg Oral QPM Derrill Center, NP   40 mg at 11/23/15 1716  . FLUoxetine (PROZAC) capsule 30 mg  30 mg Oral Daily [name], MD   30 mg at 11/24/15 0817  . gabapentin (NEURONTIN) capsule 400 mg  400 mg Oral TID [name], MD   400 mg at 11/24/15 0818  . insulin aspart (novoLOG) injection 0-5 Units  0-5 Units Subcutaneous QHS [name], MD   0 Units at 11/19/15 2245  . insulin aspart (novoLOG) injection 0-9 Units  0-9 Units Subcutaneous TID WC [name], MD   1 Units at 11/23/15 1710  . insulin  glargine (LANTUS) injection 30 Units  30 Units Subcutaneous QHS Derrill Center, NP   30 Units at 11/23/15 2128  . linagliptin (TRADJENTA) tablet 5 mg  5 mg Oral Daily Derrill Center, NP   5 mg at 11/24/15 0818  . loratadine (CLARITIN) tablet 10 mg  10 mg Oral Daily Derrill Center, NP   10 mg at 11/24/15 0818  . LORazepam (ATIVAN) tablet 1 mg  1 mg Oral Q6H PRN Saramma  Eappen, MD   1 mg at 11/24/15 1057   Or  . LORazepam (ATIVAN) injection 1 mg  1 mg Intramuscular Q6H PRN Saramma Eappen, MD      . magnesium hydroxide (MILK OF MAGNESIA) suspension 30 mL  30 mL Oral Daily PRN Nanci Pina, FNP      . metFORMIN (GLUCOPHAGE) tablet 1,000 mg  1,000 mg Oral BID WC Derrill Center, NP   1,000 mg at 11/24/15 0818  . nicotine (NICODERM CQ - dosed in mg/24 hours) patch 21 mg  21 mg Transdermal Q0600 Nanci Pina, FNP   21 mg at 11/24/15 0820  . QUEtiapine (SEROQUEL) tablet 600 mg  600 mg Oral QHS [name], MD   600 mg at 11/23/15 2103    Lab Results:  Results for orders placed or performed during the hospital encounter of 11/19/15 (from the past 48 hour(s))  Glucose, capillary     Status: Abnormal   Collection Time: 11/22/15  4:45 PM  Result Value Ref Range   Glucose-Capillary 118 (H) 65 - 99 mg/dL   Comment 1 Notify RN    Comment 2 Document in Chart   Glucose, capillary     Status: Abnormal   Collection Time: 11/22/15  7:51 PM  Result Value Ref Range   Glucose-Capillary 128 (H) 65 - 99 mg/dL  Glucose, capillary     Status: Abnormal   Collection Time: 11/23/15  5:27 AM  Result Value Ref Range   Glucose-Capillary 106 (H) 65 - 99 mg/dL  Glucose, capillary     Status: Abnormal   Collection Time: 11/23/15 12:03 PM  Result Value Ref Range   Glucose-Capillary 111 (H) 65 - 99 mg/dL   Comment 1 Notify RN    Comment 2 Document in Chart   Glucose, capillary     Status: Abnormal   Collection Time: 11/23/15  4:57 PM  Result Value Ref Range   Glucose-Capillary 129 (H) 65 - 99 mg/dL   Comment  1 Notify RN    Comment 2 Document in Chart   Glucose, capillary     Status: Abnormal   Collection Time: 11/23/15  8:40 PM  Result Value Ref Range   Glucose-Capillary 111 (H) 65 - 99 mg/dL  Glucose, capillary     Status: None   Collection Time: 11/24/15  6:23 AM  Result Value Ref Range   Glucose-Capillary 88 65 - 99 mg/dL  Glucose, capillary     Status: None   Collection Time: 11/24/15 11:46 AM  Result Value Ref Range   Glucose-Capillary 95 65 - 99 mg/dL   Comment 1 Notify RN    Comment 2 Document in Chart     Blood Alcohol level:  Lab Results  Component Value Date   ETH <5 11/18/2015   ETH <5 10/18/2015    Physical Findings: AIMS: Facial and Oral Movements Muscles of Facial Expression: None, normal Lips and Perioral Area: None, normal Jaw: None, normal Tongue: None, normal,Extremity Movements Upper (arms, wrists, hands, fingers): None, normal Lower (legs, knees, ankles, toes): None, normal, Trunk Movements Neck, shoulders, hips: None, normal, Overall Severity Severity of abnormal movements (highest score from questions above): None, normal Incapacitation due to abnormal movements: None, normal Patient's awareness of abnormal movements (rate only patient's report): No Awareness, Dental Status Current problems with teeth and/or dentures?: No Does patient usually wear dentures?: No  CIWA:    COWS:     Musculoskeletal: Strength & Muscle Tone: within normal limits Gait & Station: normal  Patient leans: N/A  Psychiatric Specialty Exam: Review of Systems  Psychiatric/Behavioral: Positive for depression, suicidal ideas, hallucinations and substance abuse. The patient is nervous/anxious and has insomnia.   All other systems reviewed and are negative.   Blood pressure 149/100, pulse 74, temperature 98.7 F (37.1 C), temperature source Oral, resp. rate 18, height 5\' 4"  (1.626 m), weight 96.163 kg (212 lb).Body mass index is 36.37 kg/(m^2).  General Appearance: Casual  Eye  Contact::  Fair  Speech:  Clear and Coherent  Volume:  Normal  Mood:  Depressed   Affect:  Congruent  Thought Process:  Coherent  Orientation:  Full (Time, Place, and Person)  Thought Content:  Delusions, Hallucinations: Auditory, Paranoid Ideation and Rumination   Suicidal Thoughts:  Yes.  without intent/plan on and off , contracts for safety  Homicidal Thoughts:  No  Memory:  Immediate;   Fair Recent;   Fair Remote;   Fair  Judgement:  Fair  Insight:  Shallow  Psychomotor Activity:  Restlessness  Concentration:  Fair  Recall:  [employer] of Knowledge:Fair  Language: Fair  Akathisia:  No  Handed:  Right  AIMS (if indicated):     Assets:  Communication Skills Desire for Improvement Resilience  ADL's:  Intact  Cognition: WNL  Sleep:  Number of Hours: 5.25     Treatment Plan Summary:Maureen Swanson is a  50 y.o.AA female , who has a hx of schizoaffective do , cocaine, alcohol, cannabis abuse, who voluntarily presents to [facility] with c/o SI w/ a plan and command AH. Pt continues to be depressed, although progressing.  Continue Vistaril 25 mg every six hours PRN anxiety,  Increased Gabapentin to 400 mg TID for anxiety/withdrawal Abilify Maintena IM 400 mg - last dose--   11/21/15- repeat q28 days. increased seroquel to  600 mg po qhs for psychosis/augmenting the effect of Abilify . Patient reported feeling good on Prozac and hence Prozac was restarted on admission. Will increase to Prozac 30 mg po daily for affective sx.  Will make available PRN medications as per agitation protocol. Restarted home medications where indicated. -Individual and substance abuse counseling. Recreational therapy consult.  CSW will continue to work on disposition .Pt is very motivated to go to a substance abuse program.Patient referred to [place], FNP 11/24/2015, 1:42 PM  Physical Exam  Reviewed the information documented and agree with the treatment plan.  Sanvika Cuttino 11/26/2015 1:28 PM

## 2015-11-24 NOTE — Progress Notes (Signed)
Patient ID: Maureen Swanson, female   DOB: 02/01/1966, 50 y.o.   MRN: [id number] D: Patient calm and cooperative sitting in dayroom.  Pt mood and affect appeared depressed and flat. Pt reports her day started out rough but has been feeling better as the day went on. Denies  SI/HI/AVH.Pt attended and engaged in evening wrap up group. No behavioral issues noted.  A: Support and encouragement offered as needed. Medications administered as prescribed.  R: Patient cooperative and safe on unit. Will continue to monitor patient for safety and stability.

## 2015-11-24 NOTE — Progress Notes (Signed)
Patient ID: Maureen Swanson, female   DOB: 15-Mar-1966, 50 y.o.   MRN: [id number] D: Patient calm and cooperative sitting in dayroom.  Pt mood and affect appeared depressed and flat. Pt rated on overall day as 7 on a 0-10 scale. Pt reports having racing thoughts and positive for auditory hallucination. Denies  SI/HI/VH.No behavioral issues noted.  A: Support and encouragement offered as needed. Medications administered as prescribed.  R: Patient cooperative and safe on unit. Will continue to monitor patient for safety and stability.

## 2015-11-24 NOTE — Plan of Care (Signed)
Problem: Medication: Goal: Compliance with prescribed medication regimen will improve Outcome: Progressing Pt safe and complaint with medication regime

## 2015-11-24 NOTE — [facility] Group Notes (Signed)
Adult Psychoeducational Group Note  Date:  11/24/2015 Time:  9:15 PM  Group Topic/Focus:  Wrap-Up Group:   The focus of this group is to help patients review their daily goal of treatment and discuss progress on daily workbooks.  Participation Level:  Minimal  Participation Quality:  Attentive  Affect:  Flat  Cognitive:  Alert  Insight: Limited  Engagement in Group:  Limited  Modes of Intervention:  Discussion  Additional Comments:  Pt rated her date a 51.  Pt expressed she woke in a bad mood but it got somewhat better.  Her goal was to be stress free.  Victorino Sparrow A 11/24/2015, 9:15 PM

## 2015-11-24 NOTE — B[facility] Group Notes (Signed)
[place] Group Notes: (Clinical Social Work)   11/24/2015      Type of Therapy:  Group Therapy   Participation Level:  Did Not Attend despite MHT prompting   Selmer Dominion, LCSW 11/24/2015, 12:19 PM

## 2015-11-25 LAB — GLUCOSE, CAPILLARY
GLUCOSE-CAPILLARY: 119 mg/dL — AB (ref 65–99)
GLUCOSE-CAPILLARY: 186 mg/dL — AB (ref 65–99)
Glucose-Capillary: 82 mg/dL (ref 65–99)
Glucose-Capillary: 139 mg/dL — ABNORMAL HIGH (ref 65–99)
Glucose-Capillary: 128 mg/dL — ABNORMAL HIGH (ref 65–99)

## 2015-11-25 MED ORDER — QUETIAPINE FUMARATE 50 MG PO TABS
50.0000 mg | ORAL_TABLET | Freq: Once | ORAL | Status: AC
  Administered 2015-11-25: 50 mg via ORAL
  Filled 2015-11-25 (×2): qty 1

## 2015-11-25 MED ORDER — TRAZODONE HCL 100 MG PO TABS
100.0000 mg | ORAL_TABLET | Freq: Every day | ORAL | Status: DC
  Administered 2015-11-25: 100 mg via ORAL
  Filled 2015-11-25 (×4): qty 1

## 2015-11-25 NOTE — B[facility] Group Notes (Signed)
[place] Group Notes:  (Clinical Social Work)  11/25/2015  11:00AM-12:00PM  Summary of Progress/Problems:  The main focus of today's process group was to listen to a variety of genres of music and to identify that different types of music provoke different responses.  The patient then was able to identify personally what was soothing for them, as well as energizing, as well as how patient can personally use this knowledge in sleep habits, with depression, and with other symptoms.  The patient expressed at the beginning of group the overall feeling of sad, and had been called out of the room to see a provider when group ended.  While in group, she continually checked on another patient who was sad.  Type of Therapy:  Music Therapy   Participation Level:  Active  Participation Quality:  Attentive and Sharing  Affect:  Blunted  Cognitive:  Oriented  Insight:  Engaged  Engagement in Therapy:  Engaged  Modes of Intervention:   Activity, Exploration  Selmer Dominion, LCSW 11/25/2015

## 2015-11-25 NOTE — Plan of Care (Signed)
Problem: Self-Concept: Goal: Ability to disclose and discuss suicidal ideas will improve Outcome: Progressing Patient is able to discuss and disclose suicidal thoughts to RN today. Pt verbally contracts for safety and agrees not to harm self.  Problem: Safety: Goal: Periods of time without injury will increase Outcome: Progressing Patient remains free from injury. Pt remains safe. 15 minute checks continued per protocol for patient safety.

## 2015-11-25 NOTE — [facility] Group Notes (Signed)
[place] Group Notes:  (Nursing/MHT/Case Management/Adjunct)  Date:  11/25/2015  Time:  0930am  Type of Therapy:  Nurse Education  Participation Level:  Active  Participation Quality:  Attentive  Affect:  Blunted and Depressed  Cognitive:  Alert and Appropriate  Insight:  Improving  Engagement in Group:  Engaged  Modes of Intervention:  Discussion, Education and Support  Summary of Progress/Problems: Patient attended group, remained engaged, and responded appropriately when prompted.  Linward Headland 11/25/2015, 10:45 AM

## 2015-11-25 NOTE — Progress Notes (Signed)
D: Patient is alert and oriented. Pt's mood and affect is depressed, anxious, and blunted. Pt denies HI and AVH. Pt reports passive suicidal thoughts this morning. Pt reports high levels of anxiety due to potential for switching halls at [facility], during this time pt also reports feeling "dizzy" and requests CBG, CBG- 139. Pt hypertensive (See doc flowsheet-vitals). Pt uses wheelchair to assist with ambulation at times. Pt complains of back pain 8/10 which decreased with PRN medication, heat applied. Pt rates depression 9/10, and hopelessness and anxiety both 8/10. Pt reports her goal for the day is "getting into the path program or somewhere long term or the ACT program and get my depression lower." Pt is attending unit groups. A: Active listening by RN. Encouragement/Support provided to pt. CBG's monitored throughout the shift as ordered. Scheduled medications administered per providers orders (See MAR). PRN medications administered per providers orders for anxiety and pain (See MAR). 15 minute checks continued per protocol for patient safety.  R: Patient cooperative and receptive to nursing interventions. Pt remains safe. Pt verbally contracts for safety and agrees not to harm self and to come to staff with increased intensity in suicidal thoughts.

## 2015-11-25 NOTE — Progress Notes (Signed)
[facility] MD Progress Note  11/25/2015 11:13 AM Maureen Swanson  MRN:  [id number] Subjective:  Patient reports "I feel lousy. I feel sad and frustrated. I want to find somewhere to go before I leave here. Between one or two the sources I hope something comes through before I leave. P.A.T.[facility] from  [facility] came to see me and [facility] is also a possible option.  "   Objective: Maureen Swanson is a  50 y.o. AA female , who has a hx of schizoaffective do , cocaine, alcohol, cannabis abuse, who voluntarily presents to [facility] with c/o SI w/ a plan and command AH. Maureen Swanson is well known to the [facility] unit.  Maureen Swanson has been to [facility] several times and majority of the admission have been due to relapse on cocaine which inturn leads her to have worsening sx of her schizoaffective do. Maureen Swanson is endorsing SI at this time, she is unable to identify what contributed to her feelings since yesterday. But notes that she woke up like this, and cant seem to shake it.  Maureen Swanson today continues to have depressive sx , racing thoughts and reports SI - contracts for safety while on the unit. Per staff - Maureen Swanson has been taking her medications ,denies ADRs. Continues to need encouragement and support. She notes poor sleeping habits over the night, and tossed and turned. She did attend breakfast, although she didn't eat much. She reports decreased sleep and appetite at thist time. She reports her goals for today is stay out of the bed, and talk to staff when she starts to have suicidal thoughts. Pt became upset when she was advised she was going to be getting a roommate, or moving to [address]. " [address] is not for me I dont feel like I can benefit from other there, they dont have the doctor and staff environment that I like. But I dont talk to nobody anyway so I can deal with a roommate. "   Principal Problem: Schizoaffective disorder, bipolar type ([place]) Diagnosis:   Patient Active Problem List   Diagnosis Date Noted  . Cannabis use disorder, moderate,  dependence ([place]) [F12.20] 11/20/2015  . Homicidal ideation [R45.850]   . Tobacco use disorder [F17.200] 07/20/2015  . Diabetes mellitus ([place]) [E11.9] 07/20/2015  . Suicidal ideation [R45.851]   . Cocaine use disorder, moderate, dependence ([place]) [F14.20] 04/03/2015  . Schizoaffective disorder, bipolar type ([place]) [F25.0] 04/03/2015  . Alcohol use disorder, moderate, dependence ([place]) [F10.20] 04/03/2015  . Acute ischemic stroke ([place]) [I63.9] 12/08/2014  . Left-sided weakness [M62.89] 12/08/2014  . 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 Knee pain [M25.569] 10/17/2010  . MAMMOGRAM, ABNORMAL, RIGHT [R92.8] 08/05/2010  . Dyslipidemia [E78.5] 06/21/2010  . AMENORRHEA, SECONDARY [N91.2] 10/29/2009  . 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 GERD [K21.9] 10/11/2007  . Essential hypertension [I10] 02/26/2007   Total Time spent with patient: 25 minutes  Past Psychiatric History: as per H&P.  Past Medical History:  Past Medical History  Diagnosis Date  . High cholesterol   . Depression   . Gout   . Anxiety   . Bipolar 1 disorder ([place])   . GERD (gastroesophageal reflux disease)   . Substance abuse     crack cocaine  . Headache(784.0)     migraines  . TMJ (temporomandibular joint disorder)   . Asthma     daily and prn inhalers  . Arthritis     back and knees  . Gastric ulcer   . Atypical  ductal hyperplasia of breast 03/2012    right  . Hypertension     under control, has been on med. x 12 yrs.  . Diabetes mellitus     diet-controlled  . CHF (congestive heart failure) [facility])     Past Surgical History  Procedure Laterality Date  . Knee arthroscopy w/ partial medial meniscectomy  05/01/2010    right  . Breast lumpectomy with needle localization  04/19/2012    Procedure: BREAST LUMPECTOMY WITH NEEDLE LOCALIZATION;  Surgeon: [name], MD;  Location: [place];  Service: General;  Laterality: Right;   Family History:  Family History  Problem  Relation Age of Onset  . Diabetes Mother   . Breast cancer Mother   . Cancer Father   . Bipolar disorder Maternal Aunt   . Schizophrenia Maternal Grandfather   . Alcoholism Maternal Uncle    Family Psychiatric  History: See Above Social History: As per H&P. History  Alcohol Use  . 0.0 oz/week    Comment: reports she drinks 3 40 ounce beers daily     History  Drug Use  . Yes  . Special: Cocaine    Comment: 09/28/2015    Social History   Social History  . Marital Status: Single    Spouse Name: N/A  . Number of Children: N/A  . Years of Education: N/A   Social History Main Topics  . Smoking status: Current Every Day Smoker -- 0.50 packs/day for 15 years    Types: Cigarettes    Last Attempt to Quit: 08/02/2012  . Smokeless tobacco: Never Used  . Alcohol Use: 0.0 oz/week     Comment: reports she drinks 3 40 ounce beers daily  . Drug Use: Yes    Special: Cocaine     Comment: 09/28/2015  . Sexual Activity: No   Other Topics Concern  . None   Social History Narrative   Additional Social History:    Sleep: restless   Appetite:  Fair  Current Medications: Current Facility-Administered Medications  Medication Dose Route Frequency Provider Last Rate Last Dose  . acetaminophen (TYLENOL) tablet 650 mg  650 mg Oral Q6H PRN Nanci Pina, FNP   650 mg at 11/25/15 1025  . alum & mag hydroxide-simeth (MAALOX/MYLANTA) 200-200-20 MG/5ML suspension 30 mL  30 mL Oral Q4H PRN Nanci Pina, FNP      . amLODipine (NORVASC) tablet 5 mg  5 mg Oral Daily Derrill Center, NP   5 mg at 11/25/15 0836  . ARIPiprazole SUSR 400 mg  400 mg Intramuscular Q28 days [name], MD   400 mg at 11/21/15 0830  . atenolol (TENORMIN) tablet 100 mg  100 mg Oral Daily [name], MD   100 mg at 11/25/15 0836  . atorvastatin (LIPITOR) tablet 40 mg  40 mg Oral QPM Derrill Center, NP   40 mg at 11/24/15 1826  . FLUoxetine (PROZAC) capsule 30 mg  30 mg Oral Daily [name], MD   30 mg at  11/25/15 0836  . gabapentin (NEURONTIN) capsule 400 mg  400 mg Oral TID [name], MD   400 mg at 11/25/15 0836  . insulin aspart (novoLOG) injection 0-5 Units  0-5 Units Subcutaneous QHS [name], MD   0 Units at 11/19/15 2245  . insulin aspart (novoLOG) injection 0-9 Units  0-9 Units Subcutaneous TID WC [name], MD   1 Units at 11/23/15 1710  . insulin glargine (LANTUS) injection  30 Units  30 Units Subcutaneous QHS Derrill Center, NP   30 Units at 11/24/15 2130  . linagliptin (TRADJENTA) tablet 5 mg  5 mg Oral Daily Derrill Center, NP   5 mg at 11/25/15 0836  . loratadine (CLARITIN) tablet 10 mg  10 mg Oral Daily Derrill Center, NP   10 mg at 11/25/15 0836  . LORazepam (ATIVAN) tablet 1 mg  1 mg Oral Q6H PRN [name], MD   1 mg at 11/25/15 1007   Or  . LORazepam (ATIVAN) injection 1 mg  1 mg Intramuscular Q6H PRN Saramma Eappen, MD      . magnesium hydroxide (MILK OF MAGNESIA) suspension 30 mL  30 mL Oral Daily PRN Nanci Pina, FNP      . metFORMIN (GLUCOPHAGE) tablet 1,000 mg  1,000 mg Oral BID WC Derrill Center, NP   1,000 mg at 11/25/15 0836  . nicotine (NICODERM CQ - dosed in mg/24 hours) patch 21 mg  21 mg Transdermal Q0600 Nanci Pina, FNP   21 mg at 11/25/15 0839  . QUEtiapine (SEROQUEL) tablet 600 mg  600 mg Oral QHS [name], MD   600 mg at 11/24/15 2128    Lab Results:  Results for orders placed or performed during the hospital encounter of 11/19/15 (from the past 48 hour(s))  Glucose, capillary     Status: Abnormal   Collection Time: 11/23/15 12:03 PM  Result Value Ref Range   Glucose-Capillary 111 (H) 65 - 99 mg/dL   Comment 1 Notify RN    Comment 2 Document in Chart   Glucose, capillary     Status: Abnormal   Collection Time: 11/23/15  4:57 PM  Result Value Ref Range   Glucose-Capillary 129 (H) 65 - 99 mg/dL   Comment 1 Notify RN    Comment 2 Document in Chart   Glucose, capillary     Status: Abnormal   Collection Time: 11/23/15   8:40 PM  Result Value Ref Range   Glucose-Capillary 111 (H) 65 - 99 mg/dL  Glucose, capillary     Status: None   Collection Time: 11/24/15  6:23 AM  Result Value Ref Range   Glucose-Capillary 88 65 - 99 mg/dL  Glucose, capillary     Status: None   Collection Time: 11/24/15 11:46 AM  Result Value Ref Range   Glucose-Capillary 95 65 - 99 mg/dL   Comment 1 Notify RN    Comment 2 Document in Chart   Glucose, capillary     Status: Abnormal   Collection Time: 11/24/15  5:26 PM  Result Value Ref Range   Glucose-Capillary 103 (H) 65 - 99 mg/dL  Glucose, capillary     Status: Abnormal   Collection Time: 11/24/15  8:28 PM  Result Value Ref Range   Glucose-Capillary 120 (H) 65 - 99 mg/dL  Glucose, capillary     Status: None   Collection Time: 11/25/15  5:55 AM  Result Value Ref Range   Glucose-Capillary 82 65 - 99 mg/dL  Glucose, capillary     Status: Abnormal   Collection Time: 11/25/15 10:09 AM  Result Value Ref Range   Glucose-Capillary 139 (H) 65 - 99 mg/dL    Blood Alcohol level:  Lab Results  Component Value Date   ETH <5 11/18/2015   ETH <5 10/18/2015    Physical Findings: AIMS: Facial and Oral Movements Muscles of Facial Expression: None, normal Lips and Perioral Area: None, normal Jaw: None, normal Tongue:  None, normal,Extremity Movements Upper (arms, wrists, hands, fingers): None, normal Lower (legs, knees, ankles, toes): None, normal, Trunk Movements Neck, shoulders, hips: None, normal, Overall Severity Severity of abnormal movements (highest score from questions above): None, normal Incapacitation due to abnormal movements: None, normal Patient's awareness of abnormal movements (rate only patient's report): No Awareness, Dental Status Current problems with teeth and/or dentures?: No Does patient usually wear dentures?: No  CIWA:    COWS:     Musculoskeletal: Strength & Muscle Tone: within normal limits Gait & Station: normal Patient leans: N/A  Psychiatric  Specialty Exam: Review of Systems  Psychiatric/Behavioral: Positive for depression, suicidal ideas, hallucinations and substance abuse. The patient is nervous/anxious and has insomnia.   All other systems reviewed and are negative.   Blood pressure 151/102, pulse 83, temperature 98.6 F (37 C), temperature source Oral, resp. rate 15, height 5\' 4"  (1.626 m), weight 96.163 kg (212 lb).Body mass index is 36.37 kg/(m^2).  General Appearance: Casual  Eye Contact::  Fair  Speech:  Clear and Coherent  Volume:  Normal  Mood:  Depressed   Affect:  Congruent  Thought Process:  Coherent  Orientation:  Full (Time, Place, and Person)  Thought Content:  Delusions, Hallucinations: Auditory, Paranoid Ideation and Rumination   Suicidal Thoughts:  Yes.  without intent/plan on and off , contracts for safety  Homicidal Thoughts:  No  Memory:  Immediate;   Fair Recent;   Fair Remote;   Fair  Judgement:  Fair  Insight:  Shallow  Psychomotor Activity:  Restlessness  Concentration:  Fair  Recall:  [employer] of Knowledge:Fair  Language: Fair  Akathisia:  No  Handed:  Right  AIMS (if indicated):     Assets:  Communication Skills Desire for Improvement Resilience  ADL's:  Intact  Cognition: WNL  Sleep:  Number of Hours: 6     Treatment Plan Summary:Clarann Jerilynn Mages Cerasoli is a  50 y.o.AA female , who has a hx of schizoaffective do , cocaine, alcohol, cannabis abuse, who voluntarily presents to [facility] with c/o SI w/ a plan and command AH. Pt continues to be depressed, although progressing  . Will continue treatment.  Daily contact with patient to assess and evaluate symptoms and progress in treatment and Medication management  Will start Trazodone 100mg  po qhs for insomnia.  Continue Vistaril 25 mg every six hours PRN anxiety,  Increased Gabapentin to 400 mg TID for anxiety/withdrawal  Abilify Maintena IM 400 mg - last dose--   11/21/15- repeat q28 days. increased seroquel to  600 mg po qhs for  psychosis/augmenting the effect of Abilify . Patient reported feeling good on Prozac and hence Prozac was restarted on admission. Will increase to Prozac 30 mg po daily for affective sx. Will make available PRN medications as per agitation protocol. Restarted home medications where indicated. -Individual and substance abuse counseling. Recreational therapy consult. CSW will continue to work on disposition .Pt is very motivated to go to a substance abuse program.Patient referred to [place], [place] 11/25/2015, 11:13 AM  Physical Exam  Reviewed the information documented and agree with the treatment plan.  Algenis Ballin 11/26/2015 1:42 PM

## 2015-11-25 NOTE — Progress Notes (Signed)
[place] Group Notes:  (Nursing/MHT/Case Management/Adjunct)  Date:  11/25/2015  Time:  9:17 PM  Type of Therapy:  Psychoeducational Skills  Participation Level:  Minimal  Participation Quality:  Attentive  Affect:  Flat  Cognitive:  Appropriate  Insight:  Limited  Engagement in Group:  Limited  Modes of Intervention:  Education  Summary of Progress/Problems: The patient had little to share with the group this evening except to say that she tried to keep to herself today and that her day was "all right" overall. As for the theme of the day, her support system will be comprised of her son.   Archie Balboa S 11/25/2015, 9:17 PM

## 2015-11-25 NOTE — Progress Notes (Signed)
D-pt seemed to be preoccupied with others things on her mind during group, she had little to say and seemed depressed A-pt attended group & took her medications R-cont to monitor for safety

## 2015-11-26 LAB — GLUCOSE, CAPILLARY
GLUCOSE-CAPILLARY: 111 mg/dL — AB (ref 65–99)
Glucose-Capillary: 92 mg/dL (ref 65–99)
Glucose-Capillary: 90 mg/dL (ref 65–99)
Glucose-Capillary: 134 mg/dL — ABNORMAL HIGH (ref 65–99)

## 2015-11-26 MED ORDER — TRAZODONE HCL 100 MG PO TABS
125.0000 mg | ORAL_TABLET | Freq: Every day | ORAL | Status: DC
  Administered 2015-11-26 – 2015-11-27 (×2): 125 mg via ORAL
  Filled 2015-11-26 (×3): qty 1

## 2015-11-26 MED ORDER — FLUOXETINE HCL 20 MG PO CAPS
40.0000 mg | ORAL_CAPSULE | Freq: Every day | ORAL | Status: DC
  Administered 2015-11-27 – 2015-11-29 (×3): 40 mg via ORAL
  Filled 2015-11-26: qty 14
  Filled 2015-11-26: qty 2
  Filled 2015-11-26: qty 14
  Filled 2015-11-26: qty 2
  Filled 2015-11-26: qty 14
  Filled 2015-11-26: qty 2

## 2015-11-26 NOTE — Progress Notes (Signed)
[facility] MD Progress Note  11/26/2015 2:37 PM Maureen Swanson  MRN:  [id number] Subjective:  Patient reports "I am not too good."        Objective: Maureen Swanson is a  50 y.o. AA female , who has a hx of schizoaffective do , cocaine, alcohol, cannabis abuse, who voluntarily presents to [facility] with c/o SI w/ a plan and command AH. Maureen Swanson is well known to the [facility] unit .  Maureen Swanson has been to [facility] several times and majority of the admission have been due to relapse on cocaine which inturn leads her to have worsening sx of her schizoaffective do . Maureen Swanson today continues to have SI with whatever plan possible , contracts for safety on the unit , has AH that are negative and has sleep issues.  Discussed increasing her medications - will continue treatment.     Principal Problem: Schizoaffective disorder, bipolar type ([place]) Diagnosis:   Patient Active Problem List   Diagnosis Date Noted  . Cannabis use disorder, moderate, dependence ([place]) [F12.20] 11/20/2015  . Homicidal ideation [R45.850]   . Tobacco use disorder [F17.200] 07/20/2015  . Diabetes mellitus ([place]) [E11.9] 07/20/2015  . Suicidal ideation [R45.851]   . Cocaine use disorder, moderate, dependence ([place]) [F14.20] 04/03/2015  . Schizoaffective disorder, bipolar type ([place]) [F25.0] 04/03/2015  . Alcohol use disorder, moderate, dependence ([place]) [F10.20] 04/03/2015  . Acute ischemic stroke ([place]) [I63.9] 12/08/2014  . Left-sided weakness [M62.89] 12/08/2014  . 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 Knee pain [M25.569] 10/17/2010  . MAMMOGRAM, ABNORMAL, RIGHT [R92.8] 08/05/2010  . Dyslipidemia [E78.5] 06/21/2010  . AMENORRHEA, SECONDARY [N91.2] 10/29/2009  . 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 GERD [K21.9] 10/11/2007  . Essential hypertension [I10] 02/26/2007   Total Time spent with patient: 25 minutes  Past Psychiatric History: as per H&P.  Past Medical History:  Past Medical History  Diagnosis Date  . High cholesterol    . Depression   . Gout   . Anxiety   . Bipolar 1 disorder ([place])   . GERD (gastroesophageal reflux disease)   . Substance abuse     crack cocaine  . Headache(784.0)     migraines  . TMJ (temporomandibular joint disorder)   . Asthma     daily and prn inhalers  . Arthritis     back and knees  . Gastric ulcer   . 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 Hypertension     under control, has been on med. x 12 yrs.  . Diabetes mellitus     diet-controlled  . CHF (congestive heart failure) [facility])     Past Surgical History  Procedure Laterality Date  . Knee arthroscopy w/ partial medial meniscectomy  05/01/2010    right  . Breast lumpectomy with needle localization  04/19/2012    Procedure: BREAST LUMPECTOMY WITH NEEDLE LOCALIZATION;  Surgeon: [name], MD;  Location: [place];  Service: General;  Laterality: Right;   Family History:  Family History  Problem Relation Age of Onset  . Diabetes Mother   . Breast cancer Mother   . Cancer Father   . Bipolar disorder Maternal Aunt   . Schizophrenia Maternal Grandfather   . Alcoholism Maternal Uncle    Family Psychiatric  History: See Above Social History: As per H&P. History  Alcohol Use  . 0.0 oz/week    Comment: reports she drinks 3 40 ounce beers daily  History  Drug Use  . Yes  . Special: Cocaine    Comment: 09/28/2015    Social History   Social History  . Marital Status: Single    Spouse Name: N/A  . Number of Children: N/A  . Years of Education: N/A   Social History Main Topics  . Smoking status: Current Every Day Smoker -- 0.50 packs/day for 15 years    Types: Cigarettes    Last Attempt to Quit: 08/02/2012  . Smokeless tobacco: Never Used  . Alcohol Use: 0.0 oz/week     Comment: reports she drinks 3 40 ounce beers daily  . Drug Use: Yes    Special: Cocaine     Comment: 09/28/2015  . Sexual Activity: No   Other Topics Concern  . None   Social History Narrative    Additional Social History:                         Sleep: restless   Appetite:  Fair  Current Medications: Current Facility-Administered Medications  Medication Dose Route Frequency Provider Last Rate Last Dose  . acetaminophen (TYLENOL) tablet 650 mg  650 mg Oral Q6H PRN Nanci Pina, FNP   650 mg at 11/26/15 [id number]  . alum & mag hydroxide-simeth (MAALOX/MYLANTA) 200-200-20 MG/5ML suspension 30 mL  30 mL Oral Q4H PRN Nanci Pina, FNP      . amLODipine (NORVASC) tablet 5 mg  5 mg Oral Daily Derrill Center, NP   5 mg at 11/26/15 0815  . ARIPiprazole SUSR 400 mg  400 mg Intramuscular Q28 days Maureen Alert, MD   400 mg at 11/21/15 0830  . atenolol (TENORMIN) tablet 100 mg  100 mg Oral Daily Maureen Alert, MD   100 mg at 11/26/15 0815  . atorvastatin (LIPITOR) tablet 40 mg  40 mg Oral QPM Derrill Center, NP   40 mg at 11/25/15 1808  . [START ON 11/27/2015] FLUoxetine (PROZAC) capsule 40 mg  40 mg Oral Daily Maureen Kirchman, MD      . gabapentin (NEURONTIN) capsule 400 mg  400 mg Oral TID Maureen Alert, MD   400 mg at 11/26/15 1302  . insulin aspart (novoLOG) injection 0-5 Units  0-5 Units Subcutaneous QHS Maureen Campus, MD   0 Units at 11/19/15 2245  . insulin aspart (novoLOG) injection 0-9 Units  0-9 Units Subcutaneous TID WC Maureen Campus, MD   2 Units at 11/25/15 1716  . insulin glargine (LANTUS) injection 30 Units  30 Units Subcutaneous QHS Derrill Center, NP   30 Units at 11/25/15 2150  . linagliptin (TRADJENTA) tablet 5 mg  5 mg Oral Daily Derrill Center, NP   5 mg at 11/26/15 0814  . loratadine (CLARITIN) tablet 10 mg  10 mg Oral Daily Derrill Center, NP   10 mg at 11/26/15 0814  . LORazepam (ATIVAN) tablet 1 mg  1 mg Oral Q6H PRN Maureen Alert, MD   1 mg at 11/26/15 [id number]   Or  . LORazepam (ATIVAN) injection 1 mg  1 mg Intramuscular Q6H PRN Jaleiah Asay, MD      . magnesium hydroxide (MILK OF MAGNESIA) suspension 30 mL  30 mL Oral Daily PRN Nanci Pina,  FNP      . metFORMIN (GLUCOPHAGE) tablet 1,000 mg  1,000 mg Oral BID WC Derrill Center, NP   1,000 mg at 11/26/15 0814  . nicotine (NICODERM CQ - dosed in mg/24  hours) patch 21 mg  21 mg Transdermal Q0600 Nanci Pina, FNP   21 mg at 11/26/15 0815  . QUEtiapine (SEROQUEL) tablet 600 mg  600 mg Oral QHS Maureen Alert, MD   600 mg at 11/25/15 2148  . traZODone (DESYREL) tablet 125 mg  125 mg Oral QHS Maureen Alert, MD        Lab Results:  Results for orders placed or performed during the hospital encounter of 11/19/15 (from the past 48 hour(s))  Glucose, capillary     Status: Abnormal   Collection Time: 11/24/15  5:26 PM  Result Value Ref Range   Glucose-Capillary 103 (H) 65 - 99 mg/dL  Glucose, capillary     Status: Abnormal   Collection Time: 11/24/15  8:28 PM  Result Value Ref Range   Glucose-Capillary 120 (H) 65 - 99 mg/dL  Glucose, capillary     Status: None   Collection Time: 11/25/15  5:55 AM  Result Value Ref Range   Glucose-Capillary 82 65 - 99 mg/dL  Glucose, capillary     Status: Abnormal   Collection Time: 11/25/15 10:09 AM  Result Value Ref Range   Glucose-Capillary 139 (H) 65 - 99 mg/dL  Glucose, capillary     Status: Abnormal   Collection Time: 11/25/15 12:12 PM  Result Value Ref Range   Glucose-Capillary 119 (H) 65 - 99 mg/dL  Glucose, capillary     Status: Abnormal   Collection Time: 11/25/15  5:01 PM  Result Value Ref Range   Glucose-Capillary 186 (H) 65 - 99 mg/dL  Glucose, capillary     Status: Abnormal   Collection Time: 11/25/15  8:29 PM  Result Value Ref Range   Glucose-Capillary 128 (H) 65 - 99 mg/dL  Glucose, capillary     Status: None   Collection Time: 11/26/15  6:07 AM  Result Value Ref Range   Glucose-Capillary 92 65 - 99 mg/dL  Glucose, capillary     Status: None   Collection Time: 11/26/15 11:47 AM  Result Value Ref Range   Glucose-Capillary 90 65 - 99 mg/dL   Comment 1 Notify RN    Comment 2 Document in Chart     Blood Alcohol level:   Lab Results  Component Value Date   ETH <5 11/18/2015   ETH <5 10/18/2015    Physical Findings: AIMS: Facial and Oral Movements Muscles of Facial Expression: None, normal Lips and Perioral Area: None, normal Jaw: None, normal Tongue: None, normal,Extremity Movements Upper (arms, wrists, hands, fingers): None, normal Lower (legs, knees, ankles, toes): None, normal, Trunk Movements Neck, shoulders, hips: None, normal, Overall Severity Severity of abnormal movements (highest score from questions above): None, normal Incapacitation due to abnormal movements: None, normal Patient's awareness of abnormal movements (rate only patient's report): No Awareness, Dental Status Current problems with teeth and/or dentures?: No Does patient usually wear dentures?: No  CIWA:    COWS:     Musculoskeletal: Strength & Muscle Tone: within normal limits Gait & Station: normal Patient leans: N/A  Psychiatric Specialty Exam: Review of Systems  Psychiatric/Behavioral: Positive for depression, suicidal ideas, hallucinations and substance abuse. The patient is nervous/anxious and has insomnia.   All other systems reviewed and are negative.   Blood pressure 135/97, pulse 89, temperature 98.6 F (37 C), temperature source Oral, resp. rate 16, height 5\' 4"  (1.626 m), weight 96.163 kg (212 lb).Body mass index is 36.37 kg/(m^2).  General Appearance: Casual  Eye Contact::  Fair  Speech:  Clear and Coherent  Volume:  Normal  Mood:  Depressed   Affect:  Congruent  Thought Process:  Coherent  Orientation:  Full (Time, Place, and Person)  Thought Content:  Delusions, Hallucinations: Auditory, Paranoid Ideation and Rumination   Suicidal Thoughts:  Yes.  with intent/plan on and off   Homicidal Thoughts:  No  Memory:  Immediate;   Fair Recent;   Fair Remote;   Fair  Judgement:  Fair  Insight:  Shallow  Psychomotor Activity:  Restlessness  Concentration:  Fair  Recall:  [employer] of Knowledge:Fair   Language: Fair  Akathisia:  No  Handed:  Right  AIMS (if indicated):     Assets:  Communication Skills Desire for Improvement Resilience  ADL's:  Intact  Cognition: WNL  Sleep:  Number of Hours: 6.5     Treatment Plan Summary:Montie Jerilynn Mages Langille is a  50 y.o.AA female , who has a hx of schizoaffective do , cocaine, alcohol, cannabis abuse, who voluntarily presents to [facility] with c/o SI w/ a plan and command AH. Pt continues to be depressed, although progressing  . Will continue treatment.  Daily contact with patient to assess and evaluate symptoms and progress in treatment and Medication management   Continue Vistaril 25 mg every six hours PRN anxiety,  Increased Gabapentin to 400 mg TID for anxiety/withdrawal  Abilify Maintena IM 400 mg - next dose -today -   11/21/15- repeat q28 days. Increased seroquel to  600 mg po qhs for psychosis/augmenting the effect of Abilify . Will increase to Prozac 40 mg po daily for affective sx. Will increase Trazodone to 125 mg po qhs for sleep. Will make available PRN medications as per agitation protocol. Restarted home medications where indicated. -Individual and substance abuse counseling. Recreational therapy consult. CSW will continue to work on disposition .Pt is very motivated to go to a substance abuse program.Patient referred to [facility] program.   Adaijah Endres, MD 11/26/2015, 2:37 PM

## 2015-11-26 NOTE — Progress Notes (Signed)
Adult Psychoeducational Group Note  Date:  11/26/2015 Time:  9:14 PM  Group Topic/Focus:  Wrap-Up Group:   The focus of this group is to help patients review their daily goal of treatment and discuss progress on daily workbooks.  Participation Level:  Active  Participation Quality:  Appropriate  Affect:  Appropriate  Cognitive:  Appropriate  Insight: Appropriate  Engagement in Group:  Engaged  Modes of Intervention:  Discussion  Additional Comments: The patient expressed that she attend group.  Nash Shearer 11/26/2015, 9:14 PM

## 2015-11-26 NOTE — Progress Notes (Signed)
D: Pt passive SI-contracts  denies HI/AVH. Pt is pleasant and cooperative. Pt stated she was feeling a little better , pt had brighter affect upon approach.   A: Pt was offered support and encouragement. Pt was given scheduled medications. Pt was encourage to attend groups. Q 15 minute checks were done for safety.  R:Pt attends groups and interacts well with peers and staff. Pt is taking medication. Pt has no complaints.Pt receptive to treatment and safety maintained on unit.

## 2015-11-26 NOTE — BHH Group Notes (Signed)
[place] LCSW Group Therapy  11/26/2015 1:15 pm  Type of Therapy: Process Group Therapy  Participation Level:  Active  Participation Quality:  Appropriate  Affect:  Flat  Cognitive:  Oriented  Insight:  Improving  Engagement in Group:  Limited  Engagement in Therapy:  Limited  Modes of Intervention:  Activity, Clarification, Education, Problem-solving and Support  Summary of Progress/Problems: Today's group addressed the issue of overcoming obstacles.  Patients were asked to identify their biggest obstacle post d/c that stands in the way of their on-going success, and then problem solve as to how to manage this. Stayed the entire time, engaged throughout.  Minimal interaction.  Talked about her optimism that her ACT team will be able to help her out with getting into a better situation upon d/c from here.  "I will be staying with my son on Wednesday when he gets back into town.  But I've done that before , and it's not ideal.  We don't see eye to eye on a lot of things."  [name] 11/26/2015   4:02 PM

## 2015-11-26 NOTE — Progress Notes (Signed)
Maureen Swanson has been calm and cooperative this a.m. She complains of feeling unwell and depressed. She reports having thoughts of harming herself but is able to verbally contract for safety. She also endorses thoughts of harming others ("no one in particular"). She has not expressed or appeared hostile. She complains of bilateral foot pain and is using a wheelchair. She is presently sleeping in her room while most patients are outside. A: Meds given as ordered, including Tylenol PRN for foot pain. Q15 safety checks maintained. Support/encouragement offered. R: Pt remains free from harm and continues with treatment. Will continue to monitor for needs/safety.

## 2015-11-27 LAB — GLUCOSE, CAPILLARY
GLUCOSE-CAPILLARY: 136 mg/dL — AB (ref 65–99)
GLUCOSE-CAPILLARY: 145 mg/dL — AB (ref 65–99)
Glucose-Capillary: 115 mg/dL — ABNORMAL HIGH (ref 65–99)
Glucose-Capillary: 165 mg/dL — ABNORMAL HIGH (ref 65–99)

## 2015-11-27 NOTE — BHH Group Notes (Signed)
[facility] LCSW Group Therapy  11/27/2015 , 1:03 PM   Type of Therapy:  Group Therapy  Participation Level:  Active  Participation Quality:  Attentive  Affect:  Appropriate  Cognitive:  Alert  Insight:  Improving  Engagement in Therapy:  Engaged  Modes of Intervention:  Discussion, Exploration and Socialization  Summary of Progress/Problems: Today's group focused on the term Diagnosis.  Participants were asked to define the term, and then pronounce whether it is a negative, positive or neutral term. Stayed for the first 20 minutes.  Left and did not return.  [name] 11/27/2015 , 1:03 PM

## 2015-11-27 NOTE — Progress Notes (Signed)
[facility] MD Progress Note  11/27/2015 2:39 PM KEYWANNA WENIG  MRN:  [id number] Subjective:  Patient reports "I am OK."         Objective: RHYANA BUSALACCHI is a  50 y.o. AA female , who has a hx of schizoaffective do , cocaine, alcohol, cannabis abuse, who voluntarily presents to [facility] with c/o SI w/ a plan and command [place]. Noele is well known to the [facility] unit .  Elma has been to [facility] several times and majority of the admission have been due to relapse on cocaine which inturn leads her to have worsening sx of her schizoaffective do . Kiannah today reports her sx as improving. Sugar denies AH and reports sleep as improving. Per staff - she continues to be compliant on her medications , denies ADRs.    Principal Problem: Schizoaffective disorder, bipolar type ([place]) Diagnosis:   Patient Active Problem List   Diagnosis Date Noted  . Cannabis use disorder, moderate, dependence ([place]) [F12.20] 11/20/2015  . Homicidal ideation [R45.850]   . Tobacco use disorder [F17.200] 07/20/2015  . Diabetes mellitus ([place]) [E11.9] 07/20/2015  . Suicidal ideation [R45.851]   . Cocaine use disorder, moderate, dependence ([place]) [F14.20] 04/03/2015  . Schizoaffective disorder, bipolar type ([place]) [F25.0] 04/03/2015  . Alcohol use disorder, moderate, dependence ([place]) [F10.20] 04/03/2015  . Acute ischemic stroke ([place]) [I63.9] 12/08/2014  . Left-sided weakness [M62.89] 12/08/2014  . 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 Knee pain [M25.569] 10/17/2010  . MAMMOGRAM, ABNORMAL, RIGHT [R92.8] 08/05/2010  . Dyslipidemia [E78.5] 06/21/2010  . AMENORRHEA, SECONDARY [N91.2] 10/29/2009  . 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 GERD [K21.9] 10/11/2007  . Essential hypertension [I10] 02/26/2007   Total Time spent with patient: 25 minutes  Past Psychiatric History: as per H&P.  Past Medical History:  Past Medical History  Diagnosis Date  . High cholesterol   . Depression   . Gout   . Anxiety   . Bipolar 1  disorder ([place])   . GERD (gastroesophageal reflux disease)   . Substance abuse     crack cocaine  . Headache(784.0)     migraines  . TMJ (temporomandibular joint disorder)   . Asthma     daily and prn inhalers  . Arthritis     back and knees  . Gastric ulcer   . 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 Hypertension     under control, has been on med. x 12 yrs.  . Diabetes mellitus     diet-controlled  . CHF (congestive heart failure) [facility])     Past Surgical History  Procedure Laterality Date  . Knee arthroscopy w/ partial medial meniscectomy  05/01/2010    right  . Breast lumpectomy with needle localization  04/19/2012    Procedure: BREAST LUMPECTOMY WITH NEEDLE LOCALIZATION;  Surgeon: [name], MD;  Location: [place];  Service: General;  Laterality: Right;   Family History:  Family History  Problem Relation Age of Onset  . Diabetes Mother   . Breast cancer Mother   . Cancer Father   . Bipolar disorder Maternal Aunt   . Schizophrenia Maternal Grandfather   . Alcoholism Maternal Uncle    Family Psychiatric  History: See Above Social History: As per H&P. History  Alcohol Use  . 0.0 oz/week    Comment: reports she drinks 3 40 ounce beers daily     History  Drug Use  . Yes  .  Special: Cocaine    Comment: 09/28/2015    Social History   Social History  . Marital Status: Single    Spouse Name: N/A  . Number of Children: N/A  . Years of Education: N/A   Social History Main Topics  . Smoking status: Current Every Day Smoker -- 0.50 packs/day for 15 years    Types: Cigarettes    Last Attempt to Quit: 08/02/2012  . Smokeless tobacco: Never Used  . Alcohol Use: 0.0 oz/week     Comment: reports she drinks 3 40 ounce beers daily  . Drug Use: Yes    Special: Cocaine     Comment: 09/28/2015  . Sexual Activity: No   Other Topics Concern  . None   Social History Narrative   Additional Social History:                          Sleep: Fair   Appetite:  Fair  Current Medications: Current Facility-Administered Medications  Medication Dose Route Frequency Provider Last Rate Last Dose  . acetaminophen (TYLENOL) tablet 650 mg  650 mg Oral Q6H PRN Nanci Pina, FNP   650 mg at 11/26/15 1550  . alum & mag hydroxide-simeth (MAALOX/MYLANTA) 200-200-20 MG/5ML suspension 30 mL  30 mL Oral Q4H PRN Nanci Pina, FNP      . amLODipine (NORVASC) tablet 5 mg  5 mg Oral Daily Derrill Center, NP   5 mg at 11/27/15 0814  . ARIPiprazole SUSR 400 mg  400 mg Intramuscular Q28 days [name], MD   400 mg at 11/21/15 0830  . atenolol (TENORMIN) tablet 100 mg  100 mg Oral Daily [name], MD   100 mg at 11/27/15 0813  . atorvastatin (LIPITOR) tablet 40 mg  40 mg Oral QPM Derrill Center, NP   40 mg at 11/26/15 1725  . FLUoxetine (PROZAC) capsule 40 mg  40 mg Oral Daily [name], MD   40 mg at 11/27/15 0813  . gabapentin (NEURONTIN) capsule 400 mg  400 mg Oral TID [name], MD   400 mg at 11/27/15 1214  . insulin aspart (novoLOG) injection 0-5 Units  0-5 Units Subcutaneous QHS [name], MD   0 Units at 11/19/15 2245  . insulin aspart (novoLOG) injection 0-9 Units  0-9 Units Subcutaneous TID WC [name], MD   2 Units at 11/27/15 1212  . insulin glargine (LANTUS) injection 30 Units  30 Units Subcutaneous QHS Derrill Center, NP   30 Units at 11/26/15 2105  . linagliptin (TRADJENTA) tablet 5 mg  5 mg Oral Daily Derrill Center, NP   5 mg at 11/27/15 0814  . loratadine (CLARITIN) tablet 10 mg  10 mg Oral Daily Derrill Center, NP   10 mg at 11/27/15 0813  . LORazepam (ATIVAN) tablet 1 mg  1 mg Oral Q6H PRN [name], MD   1 mg at 11/27/15 0816   Or  . LORazepam (ATIVAN) injection 1 mg  1 mg Intramuscular Q6H PRN Jamilet Ambroise, MD      . magnesium hydroxide (MILK OF MAGNESIA) suspension 30 mL  30 mL Oral Daily PRN Nanci Pina, FNP      . metFORMIN (GLUCOPHAGE) tablet 1,000 mg   1,000 mg Oral BID WC Derrill Center, NP   1,000 mg at 11/27/15 0814  . nicotine (NICODERM CQ - dosed in mg/24 hours) patch 21 mg  21 mg Transdermal Q0600  Nanci Pina, FNP   21 mg at 11/26/15 0815  . QUEtiapine (SEROQUEL) tablet 600 mg  600 mg Oral QHS [name], MD   600 mg at 11/26/15 2155  . traZODone (DESYREL) tablet 125 mg  125 mg Oral QHS [name], MD   125 mg at 11/26/15 2155    Lab Results:  Results for orders placed or performed during the hospital encounter of 11/19/15 (from the past 48 hour(s))  Glucose, capillary     Status: Abnormal   Collection Time: 11/25/15  5:01 PM  Result Value Ref Range   Glucose-Capillary 186 (H) 65 - 99 mg/dL  Glucose, capillary     Status: Abnormal   Collection Time: 11/25/15  8:29 PM  Result Value Ref Range   Glucose-Capillary 128 (H) 65 - 99 mg/dL  Glucose, capillary     Status: None   Collection Time: 11/26/15  6:07 AM  Result Value Ref Range   Glucose-Capillary 92 65 - 99 mg/dL  Glucose, capillary     Status: None   Collection Time: 11/26/15 11:47 AM  Result Value Ref Range   Glucose-Capillary 90 65 - 99 mg/dL   Comment 1 Notify RN    Comment 2 Document in Chart   Glucose, capillary     Status: Abnormal   Collection Time: 11/26/15  4:55 PM  Result Value Ref Range   Glucose-Capillary 134 (H) 65 - 99 mg/dL   Comment 1 Notify RN    Comment 2 Document in Chart   Glucose, capillary     Status: Abnormal   Collection Time: 11/26/15  8:40 PM  Result Value Ref Range   Glucose-Capillary 111 (H) 65 - 99 mg/dL  Glucose, capillary     Status: Abnormal   Collection Time: 11/27/15  6:32 AM  Result Value Ref Range   Glucose-Capillary 115 (H) 65 - 99 mg/dL  Glucose, capillary     Status: Abnormal   Collection Time: 11/27/15 12:00 PM  Result Value Ref Range   Glucose-Capillary 165 (H) 65 - 99 mg/dL   Comment 1 Notify RN    Comment 2 Document in Chart     Blood Alcohol level:  Lab Results  Component Value Date   ETH <5  11/18/2015   ETH <5 10/18/2015    Physical Findings: AIMS: Facial and Oral Movements Muscles of Facial Expression: None, normal Lips and Perioral Area: None, normal Jaw: None, normal Tongue: None, normal,Extremity Movements Upper (arms, wrists, hands, fingers): None, normal Lower (legs, knees, ankles, toes): None, normal, Trunk Movements Neck, shoulders, hips: None, normal, Overall Severity Severity of abnormal movements (highest score from questions above): None, normal Incapacitation due to abnormal movements: None, normal Patient's awareness of abnormal movements (rate only patient's report): No Awareness, Dental Status Current problems with teeth and/or dentures?: No Does patient usually wear dentures?: No  CIWA:    COWS:     Musculoskeletal: Strength & Muscle Tone: within normal limits Gait & Station: normal Patient leans: N/A  Psychiatric Specialty Exam: Review of Systems  Psychiatric/Behavioral: Positive for depression and substance abuse.  All other systems reviewed and are negative.   Blood pressure 147/92, pulse 99, temperature 98.9 F (37.2 C), temperature source Oral, resp. rate 16, height 5\' 4"  (1.626 m), weight 96.163 kg (212 lb).Body mass index is 36.37 kg/(m^2).  General Appearance: Casual  Eye Contact::  Fair  Speech:  Clear and Coherent  Volume:  Normal  Mood:  Depressed   Affect:  Congruent  Thought Process:  Coherent  Orientation:  Full (Time, Place, and Person)  Thought Content:  Hallucinations: Auditory improving  Suicidal Thoughts:  No   Homicidal Thoughts:  No  Memory:  Immediate;   Fair Recent;   Fair Remote;   Fair  Judgement:  Fair  Insight:  Shallow  Psychomotor Activity:  Restlessness  Concentration:  Fair  Recall:  [employer] of Knowledge:Fair  Language: Fair  Akathisia:  No  Handed:  Right  AIMS (if indicated):     Assets:  Communication Skills Desire for Improvement Resilience  ADL's:  Intact  Cognition: WNL  Sleep:  Number  of Hours: 6.25     Treatment Plan Summary:Salinda Jerilynn Mages Wheatley is a  50 y.o.AA female , who has a hx of schizoaffective do , cocaine, alcohol, cannabis abuse, who voluntarily presents to [facility] with c/o SI w/ a plan and command AH. Pt continues to be depressed, although progressing  . Will continue treatment.  Daily contact with patient to assess and evaluate symptoms and progress in treatment and Medication management   Continue Vistaril 25 mg every six hours PRN anxiety,  Increased Gabapentin to 400 mg TID for anxiety/withdrawal  Abilify Maintena IM 400 mg - provided   11/21/15- repeat q28 days. Increased seroquel to  600 mg po qhs for psychosis/augmenting the effect of Abilify . Will continue Prozac 40 mg po daily for affective sx. Will continue Trazodone to 125 mg po qhs for sleep. Will make available PRN medications as per agitation protocol. Restarted home medications where indicated. -Individual and substance abuse counseling. Recreational therapy consult. CSW will continue to work on disposition .Pt is very motivated to go to a substance abuse program.Patient referred to [facility] program.   Jermy Couper, MD 11/27/2015, 2:39 PM

## 2015-11-27 NOTE — Progress Notes (Signed)
Recreation Therapy Notes  Animal-Assisted Activity (AAA) Program Checklist/Progress Notes Patient Eligibility Criteria Checklist & Daily Group note for Rec Tx Intervention  Date: 06.13.2017 Time: 2:45pm Location: [address]    AAA/T Program Assumption of Risk Form signed by Patient/ or Parent Legal Guardian NO  Behavioral Response: Did not attend. Patient discussed with MD for appropriateness in pet therapy session. Both LRT and MD agree patient is appropriate for participation. Patient offered attendance to session, patient declined.   Laureen Ochs Marshea Wisher, LRT/CTRS        Hanz Winterhalter L 11/27/2015 3:11 PM

## 2015-11-27 NOTE — Tx Team (Signed)
Interdisciplinary Treatment Plan Update (Adult) Date: 11/27/2015   Date: 11/27/2015 9:27 AM  Progress in Treatment:  Attending groups: Intermittently Participating in groups: Minimally   Taking medication as prescribed: Yes  Tolerating medication: Yes  Family/Significant othe contact made: No Patient understands diagnosis: Yes AEB seeking help with depression and AVH Discussing patient identified problems/goals with staff: Yes  Medical problems stabilized or resolved: Yes  Denies suicidal/homicidal ideation: No Pt recently admitted with SI Patient has not harmed self or Others: Yes   New problem(s) identified: None identified at this time.   Discharge Plan or Barriers: see below Additional comments: Pt continues to be depressed, although progressing .  Continue Vistaril 25 mg every six hours PRN anxiety,  Increased Gabapentin to 400 mg TID for anxiety/withdrawal Abilify Maintena IM 400 mg - next dose -today - 11/21/15- repeat q28 days. Will increase seroquel to 600 mg po qhs for psychosis/augmenting the effect of Abilify .   Reason for Continuation of Hospitalization:    Estimated length of stay:Likely d/c tomorrow  Review of initial/current patient goals per problem list:   1.  Goal(s): Patient will participate in aftercare plan  Met:  Yes  Target date: 3-5 days from date of admission   As evidenced by: Patient will participate within aftercare plan AEB aftercare provider and housing plan at discharge being identified.  11/19/15: CSW to work with Pt to assess for appropriate discharge plan and faciliate appointments and referrals as needed prior to d/c. 11/22/15:  Will d/c to shelter, follow up with ACT team 11/27/15:  Says she will stay with son, follow up [facility]  2.  Goal (s): Patient will exhibit decreased depressive symptoms and suicidal ideations.  Met:  No  Target date: 3-5 days from date of admission   As evidenced by: Patient will utilize self rating of  depression at 3 or below and demonstrate decreased signs of depression or be deemed stable for discharge by MD. 11/19/15: Pt was admitted with symptoms of depression, rating 10/10. Pt continues to present with flat affect and depressive symptoms.  Pt will demonstrate decreased symptoms of depression and rate depression at 3/10 or lower prior to discharge. 11/22/15: Rates depression a 7 today 11/27/15:  Rates her depression a 3 today  5.  Goal(s): Patient will demonstrate decreased signs of psychosis  . Met:  No . Target date: 3-5 days from date of admission  . As evidenced by: Patient will demonstrate decreased frequency of AVH or return to baseline function   -11/19/15: Pt recently admitted with auditory hallucinations and continues to endorse those   11/22/15:  Symptoms persist   6/113:  No signs nor symptoms of psychosis today Attendees:  Patient:    Family:    Physician: Dr. Parke Poisson, MD  11/27/2015 9:27 AM  Nursing: Lars Pinks, RN Case manager  11/27/2015 9:27 AM  Clinical Social Worker Rod Franklin  11/27/2015 9:27 AM  Other:  11/27/2015 9:27 AM  Clinical: Eulogio Bear, RN 11/27/2015 9:27 AM  Other: , RN Charge Nurse 11/27/2015 9:27 AM  Other:

## 2015-11-27 NOTE — [facility] Suicide Risk Assessment (Signed)
[place] INPATIENT:  Family/Significant Other Suicide Prevention Education  Suicide Prevention Education:  Patient Refusal for Family/Significant Other Suicide Prevention Education: The patient Maureen Swanson has refused to provide written consent for family/significant other to be provided Family/Significant Other Suicide Prevention Education during admission and/or prior to discharge.  Physician notified.  [name] 11/27/2015, 10:52 AM

## 2015-11-27 NOTE — Progress Notes (Signed)
Adult Psychoeducational Group Note  Date:  11/27/2015 Time:  8:15 PM  Group Topic/Focus:  Wrap-Up Group:   The focus of this group is to help patients review their daily goal of treatment and discuss progress on daily workbooks.  Participation Level:  Did Not Attend  Pt was in bed during wrap-up group.    Lincoln Brigham 11/27/2015, 8:45 PM

## 2015-11-27 NOTE — Progress Notes (Signed)
D: Pt continues to be very flat and depressed on the unit today. Pt has been visible in the milieu interacting with peers. Pt uses wheel chair on the unit due to pains in the ankles. Pt provided with medications per providers orders. Pt's labs and vitals were monitored throughout the day. Pt supported emotionally and encouraged to express concerns and questions. Pt educated on medications. Pt reported that his depression was a 5, his hopelessness was a 5, and that his anxiety was a 5. Pt reported being negative SI/HI, no AH/VH noted. A: 15 min checks continued for patient safety. R: Pts safety maintained.

## 2015-11-28 LAB — GLUCOSE, CAPILLARY
GLUCOSE-CAPILLARY: 84 mg/dL (ref 65–99)
GLUCOSE-CAPILLARY: 188 mg/dL — AB (ref 65–99)
Glucose-Capillary: 103 mg/dL — ABNORMAL HIGH (ref 65–99)

## 2015-11-28 MED ORDER — METFORMIN HCL 1000 MG PO TABS: 1000.0000 mg | ORAL_TABLET | Freq: Two times a day (BID) | ORAL | Status: DC

## 2015-11-28 MED ORDER — LINAGLIPTIN 5 MG PO TABS: 5.0000 mg | ORAL_TABLET | Freq: Every day | ORAL | Status: DC

## 2015-11-28 MED ORDER — NICOTINE 21 MG/24HR TD PT24: 21.0000 mg | MEDICATED_PATCH | Freq: Every day | TRANSDERMAL | Status: DC

## 2015-11-28 MED ORDER — INSULIN GLARGINE 100 UNIT/ML ~~LOC~~ SOLN: 30.0000 [IU] | Freq: Every day | SUBCUTANEOUS | Status: DC

## 2015-11-28 MED ORDER — QUETIAPINE FUMARATE 300 MG PO TABS: 600.0000 mg | ORAL_TABLET | Freq: Every day | ORAL | Status: DC

## 2015-11-28 MED ORDER — ARIPIPRAZOLE ER 400 MG IM SUSR: 400.0000 mg | INTRAMUSCULAR | Status: DC

## 2015-11-28 MED ORDER — GABAPENTIN 400 MG PO CAPS: 400.0000 mg | ORAL_CAPSULE | Freq: Three times a day (TID) | ORAL | Status: DC

## 2015-11-28 MED ORDER — ATORVASTATIN CALCIUM 40 MG PO TABS: 40.0000 mg | ORAL_TABLET | Freq: Every evening | ORAL | Status: DC

## 2015-11-28 MED ORDER — TRAZODONE HCL 150 MG PO TABS
150.0000 mg | ORAL_TABLET | Freq: Every day | ORAL | Status: DC
  Administered 2015-11-28: 150 mg via ORAL
  Filled 2015-11-28 (×4): qty 7

## 2015-11-28 MED ORDER — CETIRIZINE HCL 10 MG PO TABS: 10.0000 mg | ORAL_TABLET | Freq: Every day | ORAL | Status: DC

## 2015-11-28 MED ORDER — ATENOLOL 100 MG PO TABS: 100.0000 mg | ORAL_TABLET | Freq: Every day | ORAL | Status: DC

## 2015-11-28 MED ORDER — ASPIRIN 81 MG PO CHEW: 81.0000 mg | CHEWABLE_TABLET | Freq: Every day | ORAL | Status: DC

## 2015-11-28 MED ORDER — TRAZODONE HCL 150 MG PO TABS: 125.0000 mg | ORAL_TABLET | Freq: Every day | ORAL | Status: DC

## 2015-11-28 MED ORDER — AMLODIPINE BESYLATE 5 MG PO TABS: 5.0000 mg | ORAL_TABLET | Freq: Every day | ORAL | Status: DC

## 2015-11-28 MED ORDER — FLUOXETINE HCL 40 MG PO CAPS: 40.0000 mg | ORAL_CAPSULE | Freq: Every day | ORAL | Status: DC

## 2015-11-28 NOTE — Progress Notes (Signed)
Patient ID: Maureen Swanson, female   DOB: 02-Nov-1965, 50 y.o.   MRN: [id number] [facility] MD Progress Note  11/28/2015 11:57 AM KHAIYA MOBILIA  MRN:  [id number] Subjective:  Patient was scheduled for discharge today. However, states , " I am not feeling ready", and reports increase in depression, increase in auditory hallucinations, and re-emergence of suicidal ideations. Contracts for safety on the unit, and denies any plan or intention of hurting self here, but states " I don't know why, but today I don't feel safe to leave, I am having a bad day ". Denies medication side effects and states she did feel the medications were helping .  Objective: Case reviewed with team and CSW, and patient seen . As per chart notes/ staff , patient was scheduled for discharge today, and had reported she felt better- still presents depressed, sad in affect . As above , reports worsening depression today, with suicidal ideations ,and reports auditory hallucinations telling her to harm herself today. She presents depressed, sad, but does smile at times briefly during session . She does not present agitated or internally preoccupied . Denies medication side effects.     Principal Problem: Schizoaffective disorder, bipolar type ([place]) Diagnosis:   Patient Active Problem List   Diagnosis Date Noted  . Cannabis use disorder, moderate, dependence ([place]) [F12.20] 11/20/2015  . Homicidal ideation [R45.850]   . Tobacco use disorder [F17.200] 07/20/2015  . Diabetes mellitus ([place]) [E11.9] 07/20/2015  . Suicidal ideation [R45.851]   . Cocaine use disorder, moderate, dependence ([place]) [F14.20] 04/03/2015  . Schizoaffective disorder, bipolar type ([place]) [F25.0] 04/03/2015  . Alcohol use disorder, moderate, dependence ([place]) [F10.20] 04/03/2015  . Acute ischemic stroke ([place]) [I63.9] 12/08/2014  . Left-sided weakness [M62.89] 12/08/2014  . 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 Knee pain [M25.569] 10/17/2010  .  MAMMOGRAM, ABNORMAL, RIGHT [R92.8] 08/05/2010  . Dyslipidemia [E78.5] 06/21/2010  . AMENORRHEA, SECONDARY [N91.2] 10/29/2009  . 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 GERD [K21.9] 10/11/2007  . Essential hypertension [I10] 02/26/2007   Total Time spent with patient: 25 minutes  Past Psychiatric History: as per H&P.  Past Medical History:  Past Medical History  Diagnosis Date  . High cholesterol   . Depression   . Gout   . Anxiety   . Bipolar 1 disorder ([place])   . GERD (gastroesophageal reflux disease)   . Substance abuse     crack cocaine  . Headache(784.0)     migraines  . TMJ (temporomandibular joint disorder)   . Asthma     daily and prn inhalers  . Arthritis     back and knees  . Gastric ulcer   . 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 Hypertension     under control, has been on med. x 12 yrs.  . Diabetes mellitus     diet-controlled  . CHF (congestive heart failure) [facility])     Past Surgical History  Procedure Laterality Date  . Knee arthroscopy w/ partial medial meniscectomy  05/01/2010    right  . Breast lumpectomy with needle localization  04/19/2012    Procedure: BREAST LUMPECTOMY WITH NEEDLE LOCALIZATION;  Surgeon: [name], MD;  Location: [place];  Service: General;  Laterality: Right;   Family History:  Family History  Problem Relation Age of Onset  . Diabetes Mother   . Breast cancer Mother   . Cancer Father   . Bipolar disorder Maternal Aunt   .  Schizophrenia Maternal Grandfather   . Alcoholism Maternal Uncle    Family Psychiatric  History: See Above Social History: As per H&P. History  Alcohol Use  . 0.0 oz/week    Comment: reports she drinks 3 40 ounce beers daily     History  Drug Use  . Yes  . Special: Cocaine    Comment: 09/28/2015    Social History   Social History  . Marital Status: Single    Spouse Name: N/A  . Number of Children: N/A  . Years of Education: N/A   Social History Main  Topics  . Smoking status: Current Every Day Smoker -- 0.50 packs/day for 15 years    Types: Cigarettes    Last Attempt to Quit: 08/02/2012  . Smokeless tobacco: Never Used  . Alcohol Use: 0.0 oz/week     Comment: reports she drinks 3 40 ounce beers daily  . Drug Use: Yes    Special: Cocaine     Comment: 09/28/2015  . Sexual Activity: No   Other Topics Concern  . None   Social History Narrative   Additional Social History:   Sleep: Fair   Appetite:  Fair  Current Medications: Current Facility-Administered Medications  Medication Dose Route Frequency Provider Last Rate Last Dose  . acetaminophen (TYLENOL) tablet 650 mg  650 mg Oral Q6H PRN Nanci Pina, FNP   650 mg at 11/26/15 1550  . alum & mag hydroxide-simeth (MAALOX/MYLANTA) 200-200-20 MG/5ML suspension 30 mL  30 mL Oral Q4H PRN Nanci Pina, FNP      . amLODipine (NORVASC) tablet 5 mg  5 mg Oral Daily Derrill Center, NP   5 mg at 11/28/15 0815  . ARIPiprazole SUSR 400 mg  400 mg Intramuscular Q28 days [name], MD   400 mg at 11/21/15 0830  . atenolol (TENORMIN) tablet 100 mg  100 mg Oral Daily [name], MD   100 mg at 11/28/15 0816  . atorvastatin (LIPITOR) tablet 40 mg  40 mg Oral QPM Derrill Center, NP   40 mg at 11/27/15 1726  . FLUoxetine (PROZAC) capsule 40 mg  40 mg Oral Daily [name], MD   40 mg at 11/28/15 0815  . gabapentin (NEURONTIN) capsule 400 mg  400 mg Oral TID [name], MD   400 mg at 11/28/15 1156  . insulin aspart (novoLOG) injection 0-5 Units  0-5 Units Subcutaneous QHS [name], MD   0 Units at 11/19/15 2245  . insulin aspart (novoLOG) injection 0-9 Units  0-9 Units Subcutaneous TID WC [name], MD   1 Units at 11/27/15 1725  . insulin glargine (LANTUS) injection 30 Units  30 Units Subcutaneous QHS Derrill Center, NP   30 Units at 11/27/15 2150  . linagliptin (TRADJENTA) tablet 5 mg  5 mg Oral Daily Derrill Center, NP   5 mg at 11/28/15 0815  . loratadine  (CLARITIN) tablet 10 mg  10 mg Oral Daily Derrill Center, NP   10 mg at 11/28/15 0816  . LORazepam (ATIVAN) tablet 1 mg  1 mg Oral Q6H PRN [name], MD   1 mg at 11/27/15 2147   Or  . LORazepam (ATIVAN) injection 1 mg  1 mg Intramuscular Q6H PRN Saramma Eappen, MD      . magnesium hydroxide (MILK OF MAGNESIA) suspension 30 mL  30 mL Oral Daily PRN Nanci Pina, FNP      . metFORMIN (GLUCOPHAGE) tablet 1,000 mg  1,000 mg Oral BID WC Derrill Center, NP   1,000 mg at 11/28/15 0815  . nicotine (NICODERM CQ - dosed in mg/24 hours) patch 21 mg  21 mg Transdermal Q0600 Nanci Pina, FNP   21 mg at 11/26/15 0815  . QUEtiapine (SEROQUEL) tablet 600 mg  600 mg Oral QHS [name], MD   600 mg at 11/27/15 2147  . traZODone (DESYREL) tablet 150 mg  150 mg Oral QHS [name], MD        Lab Results:  Results for orders placed or performed during the hospital encounter of 11/19/15 (from the past 48 hour(s))  Glucose, capillary     Status: Abnormal   Collection Time: 11/26/15  4:55 PM  Result Value Ref Range   Glucose-Capillary 134 (H) 65 - 99 mg/dL   Comment 1 Notify RN    Comment 2 Document in Chart   Glucose, capillary     Status: Abnormal   Collection Time: 11/26/15  8:40 PM  Result Value Ref Range   Glucose-Capillary 111 (H) 65 - 99 mg/dL  Glucose, capillary     Status: Abnormal   Collection Time: 11/27/15  6:32 AM  Result Value Ref Range   Glucose-Capillary 115 (H) 65 - 99 mg/dL  Glucose, capillary     Status: Abnormal   Collection Time: 11/27/15 12:00 PM  Result Value Ref Range   Glucose-Capillary 165 (H) 65 - 99 mg/dL   Comment 1 Notify RN    Comment 2 Document in Chart   Glucose, capillary     Status: Abnormal   Collection Time: 11/27/15  4:54 PM  Result Value Ref Range   Glucose-Capillary 136 (H) 65 - 99 mg/dL   Comment 1 Notify RN    Comment 2 Document in Chart   Glucose, capillary     Status: Abnormal   Collection Time: 11/27/15  8:41 PM  Result Value Ref  Range   Glucose-Capillary 145 (H) 65 - 99 mg/dL   Comment 1 Notify RN   Glucose, capillary     Status: Abnormal   Collection Time: 11/28/15  6:19 AM  Result Value Ref Range   Glucose-Capillary 103 (H) 65 - 99 mg/dL   Comment 1 Notify RN   Glucose, capillary     Status: None   Collection Time: 11/28/15 11:39 AM  Result Value Ref Range   Glucose-Capillary 84 65 - 99 mg/dL    Blood Alcohol level:  Lab Results  Component Value Date   ETH <5 11/18/2015   ETH <5 10/18/2015    Physical Findings: AIMS: Facial and Oral Movements Muscles of Facial Expression: None, normal Lips and Perioral Area: None, normal Jaw: None, normal Tongue: None, normal,Extremity Movements Upper (arms, wrists, hands, fingers): None, normal Lower (legs, knees, ankles, toes): None, normal, Trunk Movements Neck, shoulders, hips: None, normal, Overall Severity Severity of abnormal movements (highest score from questions above): None, normal Incapacitation due to abnormal movements: None, normal Patient's awareness of abnormal movements (rate only patient's report): No Awareness, Dental Status Current problems with teeth and/or dentures?: No Does patient usually wear dentures?: No  CIWA:    COWS:     Musculoskeletal: Strength & Muscle Tone: within normal limits Gait & Station: normal Patient leans: N/A  Psychiatric Specialty Exam: Review of Systems  Psychiatric/Behavioral: Positive for depression and substance abuse.  All other systems reviewed and are negative.   Blood pressure 123/85, pulse 96, temperature 99 F (37.2 C), temperature source Oral, resp. rate 20,  height 5\' 4"  (1.626 m), weight 212 lb (96.163 kg).Body mass index is 36.37 kg/(m^2).  General Appearance: Fairly Groomed  Engineer, water::  Fair  Speech:  Normal Rate  Volume:  Decreased  Mood:  Depressed   Affect:  Constricted, sad   Thought Process:  Linear   Orientation:  Full (Time, Place, and Person)  Thought Content:  Reports auditory  hallucinations telling her to kill self ,does not appear internally preoccupied, not paranoid, no delusions expressed   Suicidal Thoughts:  Yes- describes suicidal ideations, but no plan or intention, states she can contract for safety on the unit at this time , but states she feels unsafe to discharge today   Homicidal Thoughts:  No  Memory:  Recent and remote grossly intact   Judgement:  Fair  Insight:  Fair   Psychomotor Activity:  Decreased  Concentration:  Good  Recall:  Good  Fund of Knowledge:Good  Language: Good  Akathisia:  No  Handed:  Right  AIMS (if indicated):     Assets:  Communication Skills Desire for Improvement Resilience  ADL's:  Intact  Cognition: WNL  Sleep:  Number of Hours: 6.5     Assessment- patient states she had been feeling better, but that yesterday and today has been feeling worse again and today reports she does not feel ready or safe for discharge from the unit today, as had been planned by treatment team. Although she contracts for safety on unit, she describes increased depression, re-emergence of auditory hallucinations, but does not appear internally preoccupied, and thought process is linear . She reports she is tolerating medications well, denies side effects.  Daily contact with patient to assess and evaluate symptoms and progress in treatment and Medication management  Due to worsening symptoms, will cancel discharge today, and continue inpatient treatment  Continue Vistaril 25 mg every six hours PRN anxiety,  Continue  Gabapentin400 mg TID for anxiety/withdrawal  Abilify Maintena IM 400 mg - provided   11/21/15- repeat q28 days. Continue  Seroquel to  600 mg  QHS for psychosis/augmenting the effect of Abilify- was recently titrated up  . Continue  Prozac 40 mg QDAY  for affective sx. Continue Trazodone 125 mg QHS for sleep. Treatment team will work on discharge [place], MD 11/28/2015, 11:57 AM

## 2015-11-28 NOTE — Progress Notes (Signed)
D: Pt continues to be very flat and depressed on the unit today. Pt also continues to be very isolative. Pt was due for D/C today, but pt stated she was not feeling ready for D/C because she was still suicidal and depressed. Pt reported that her depression was a 8, her hopelessness was a 8, and that her anxiety was a 8. Pt provided with medications per providers orders. Pt's labs and vitals were monitored throughout the day. Pt supported emotionally and encouraged to express concerns and questions. Pt educated on medications. Pt reported being negative SI/HI, no AH/VH noted. A: 15 min checks continued for patient safety. R: Pts safety maintained.

## 2015-11-28 NOTE — Progress Notes (Addendum)
  [facility] Adult Case Management Discharge Plan :  Will you be returning to the same living situation after discharge:  No. Will go with son At discharge, do you have transportation home?: Yes,  bus pass Do you have the ability to pay for your medications: Yes,  ACT team  Release of information consent forms completed and in the chart;  Patient's signature needed at discharge.  Patient to Follow up at: Follow-up Information    Follow up with [facility] ACT team.   Why:  Call [name] today when you get home to let her know the address and to set up a time to meet with her to complete your assessment   Contact information:   [address] [place] [phone]      Next level of care provider has access to [name] and Suicide Prevention discussed: Yes,  yes     Has patient been referred to the Quitline?: Patient refused referral  Patient has been referred for addiction treatment: Yes  Maureen Swanson 11/28/2015, 10:06 AM

## 2015-11-28 NOTE — [facility] Group Notes (Signed)
[facility] Group Therapy  11/28/2015 , 3:15 PM    Type of Therapy:  [facility] Presentation  Participation Level:  Active  Participation Quality:  Attentive  Affect:  Blunted  Cognitive:  Oriented  Insight:  Limited  Engagement in Therapy:  Engaged  Modes of Intervention:  Discussion, Education and Socialization  Summary of Progress/Problems:  Maureen Swanson from [place] came to present his recovery story and play the guitar.  Stayed the entire time.  Engaged throughout.  [name] 11/28/2015 , 3:15 PM
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
## 2015-11-29 LAB — GLUCOSE, CAPILLARY
GLUCOSE-CAPILLARY: 75 mg/dL (ref 65–99)
Glucose-Capillary: 108 mg/dL — ABNORMAL HIGH (ref 65–99)
Glucose-Capillary: 84 mg/dL (ref 65–99)

## 2015-11-29 NOTE — Progress Notes (Signed)
Patient discharged to lobby. Patient was stable and appreciative at that time. All papers, samples and prescriptions were given and valuables returned. Verbal understanding expressed. Denies SI/HI and A/VH. Patient given opportunity to express concerns and ask questions.  
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
## 2015-12-05 ENCOUNTER — Other Ambulatory Visit: Payer: Self-pay | Admitting: Orthopaedic Surgery

## 2015-12-05 DIAGNOSIS — M25571 Pain in right ankle and joints of right foot: Secondary | ICD-10-CM

## 2015-12-13 ENCOUNTER — Other Ambulatory Visit: Payer: Self-pay

## 2015-12-14 ENCOUNTER — Other Ambulatory Visit: Payer: Self-pay | Admitting: Nurse Practitioner

## 2015-12-14 DIAGNOSIS — N644 Mastodynia: Secondary | ICD-10-CM

## 2015-12-24 ENCOUNTER — Ambulatory Visit
Admission: RE | Admit: 2015-12-24 | Discharge: 2015-12-24 | Disposition: A | Discharge status: Home / Self Care | Payer: No Typology Code available for payment source | Source: Ambulatory Visit | Attending: Orthopaedic Surgery | Admitting: Orthopaedic Surgery

## 2015-12-24 DIAGNOSIS — M25571 Pain in right ankle and joints of right foot: Secondary | ICD-10-CM

## 2016-01-02 ENCOUNTER — Inpatient Hospital Stay: Admission: RE | Admit: 2016-01-02 | Payer: Self-pay | Source: Ambulatory Visit

## 2016-02-13 ENCOUNTER — Inpatient Hospital Stay (HOSPITAL_COMMUNITY)
Admission: AD | Admit: 2016-02-13 | Discharge: 2016-02-25 | DRG: 885 | Disposition: A | Discharge status: Home / Self Care | Payer: Medicaid Other | Source: Intra-hospital | Attending: Psychiatry | Admitting: Psychiatry

## 2016-02-13 ENCOUNTER — Encounter (HOSPITAL_COMMUNITY): Payer: Self-pay | Admitting: *Deleted

## 2016-02-13 ENCOUNTER — Emergency Department (HOSPITAL_COMMUNITY)
Admission: EM | Admit: 2016-02-13 | Discharge: 2016-02-13 | Disposition: A | Discharge status: Home / Self Care | Payer: No Typology Code available for payment source | Attending: Emergency Medicine | Admitting: Emergency Medicine

## 2016-02-13 DIAGNOSIS — F259 Schizoaffective disorder, unspecified: Secondary | ICD-10-CM | POA: Diagnosis present

## 2016-02-13 DIAGNOSIS — F411 Generalized anxiety disorder: Secondary | ICD-10-CM | POA: Diagnosis present

## 2016-02-13 DIAGNOSIS — E785 Hyperlipidemia, unspecified: Secondary | ICD-10-CM | POA: Diagnosis present

## 2016-02-13 DIAGNOSIS — R45851 Suicidal ideations: Secondary | ICD-10-CM | POA: Diagnosis present

## 2016-02-13 DIAGNOSIS — Z8673 Personal history of transient ischemic attack (TIA), and cerebral infarction without residual deficits: Secondary | ICD-10-CM

## 2016-02-13 DIAGNOSIS — Z590 Homelessness: Secondary | ICD-10-CM

## 2016-02-13 DIAGNOSIS — Z7982 Long term (current) use of aspirin: Secondary | ICD-10-CM | POA: Diagnosis not present

## 2016-02-13 DIAGNOSIS — J45909 Unspecified asthma, uncomplicated: Secondary | ICD-10-CM | POA: Diagnosis present

## 2016-02-13 DIAGNOSIS — Z915 Personal history of self-harm: Secondary | ICD-10-CM

## 2016-02-13 DIAGNOSIS — F1420 Cocaine dependence, uncomplicated: Secondary | ICD-10-CM | POA: Diagnosis present

## 2016-02-13 DIAGNOSIS — F17200 Nicotine dependence, unspecified, uncomplicated: Secondary | ICD-10-CM | POA: Diagnosis present

## 2016-02-13 DIAGNOSIS — Z794 Long term (current) use of insulin: Secondary | ICD-10-CM

## 2016-02-13 DIAGNOSIS — E7800 Pure hypercholesterolemia, unspecified: Secondary | ICD-10-CM | POA: Diagnosis present

## 2016-02-13 DIAGNOSIS — K219 Gastro-esophageal reflux disease without esophagitis: Secondary | ICD-10-CM | POA: Diagnosis present

## 2016-02-13 DIAGNOSIS — F1220 Cannabis dependence, uncomplicated: Secondary | ICD-10-CM | POA: Diagnosis present

## 2016-02-13 DIAGNOSIS — F17210 Nicotine dependence, cigarettes, uncomplicated: Secondary | ICD-10-CM | POA: Diagnosis present

## 2016-02-13 DIAGNOSIS — Z803 Family history of malignant neoplasm of breast: Secondary | ICD-10-CM

## 2016-02-13 DIAGNOSIS — G4700 Insomnia, unspecified: Secondary | ICD-10-CM | POA: Diagnosis present

## 2016-02-13 DIAGNOSIS — Z79899 Other long term (current) drug therapy: Secondary | ICD-10-CM | POA: Diagnosis not present

## 2016-02-13 DIAGNOSIS — Z833 Family history of diabetes mellitus: Secondary | ICD-10-CM | POA: Diagnosis not present

## 2016-02-13 DIAGNOSIS — E119 Type 2 diabetes mellitus without complications: Secondary | ICD-10-CM | POA: Diagnosis present

## 2016-02-13 DIAGNOSIS — I10 Essential (primary) hypertension: Secondary | ICD-10-CM | POA: Diagnosis present

## 2016-02-13 DIAGNOSIS — T43212A Poisoning by selective serotonin and norepinephrine reuptake inhibitors, intentional self-harm, initial encounter: Secondary | ICD-10-CM | POA: Insufficient documentation

## 2016-02-13 DIAGNOSIS — Z818 Family history of other mental and behavioral disorders: Secondary | ICD-10-CM | POA: Diagnosis not present

## 2016-02-13 DIAGNOSIS — Z7984 Long term (current) use of oral hypoglycemic drugs: Secondary | ICD-10-CM | POA: Insufficient documentation

## 2016-02-13 DIAGNOSIS — F1910 Other psychoactive substance abuse, uncomplicated: Secondary | ICD-10-CM

## 2016-02-13 DIAGNOSIS — I110 Hypertensive heart disease with heart failure: Secondary | ICD-10-CM | POA: Insufficient documentation

## 2016-02-13 DIAGNOSIS — F250 Schizoaffective disorder, bipolar type: Principal | ICD-10-CM | POA: Diagnosis present

## 2016-02-13 DIAGNOSIS — I509 Heart failure, unspecified: Secondary | ICD-10-CM | POA: Insufficient documentation

## 2016-02-13 DIAGNOSIS — T50902A Poisoning by unspecified drugs, medicaments and biological substances, intentional self-harm, initial encounter: Secondary | ICD-10-CM

## 2016-02-13 DIAGNOSIS — F1020 Alcohol dependence, uncomplicated: Secondary | ICD-10-CM | POA: Diagnosis present

## 2016-02-13 LAB — ACETAMINOPHEN LEVEL

## 2016-02-13 LAB — RAPID URINE DRUG SCREEN, HOSP PERFORMED
AMPHETAMINES: POSITIVE — AB
BENZODIAZEPINES: NOT DETECTED
Barbiturates: NOT DETECTED
COCAINE: POSITIVE — AB
Opiates: NOT DETECTED
Tetrahydrocannabinol: NOT DETECTED

## 2016-02-13 LAB — BLOOD GAS, ARTERIAL
ACID-BASE DEFICIT: 0.5 mmol/L (ref 0.0–2.0)
BICARBONATE: 23.8 mmol/L (ref 20.0–28.0)
Drawn by: 422461
FIO2: 0.21
O2 SAT: 94.3 %
PATIENT TEMPERATURE: 99.1
TCO2: 21.8 mmol/L (ref 0–100)
pCO2 arterial: 40.8 mmHg (ref 32.0–48.0)
pH, Arterial: 7.386 (ref 7.350–7.450)
pO2, Arterial: 79.4 mmHg — ABNORMAL LOW (ref 83.0–108.0)

## 2016-02-13 LAB — MAGNESIUM: MAGNESIUM: 1.6 mg/dL — AB (ref 1.7–2.4)

## 2016-02-13 LAB — CBC
HEMATOCRIT: 35.2 % — AB (ref 36.0–46.0)
Hemoglobin: 11.4 g/dL — ABNORMAL LOW (ref 12.0–15.0)
MCH: 28.7 pg (ref 26.0–34.0)
MCHC: 32.4 g/dL (ref 30.0–36.0)
MCV: 88.7 fL (ref 78.0–100.0)
PLATELETS: 221 10*3/uL (ref 150–400)
RBC: 3.97 MIL/uL (ref 3.87–5.11)
RDW: 13.2 % (ref 11.5–15.5)
WBC: 10.2 10*3/uL (ref 4.0–10.5)

## 2016-02-13 LAB — I-STAT CHEM 8, ED
BUN: 20 mg/dL (ref 6–20)
Calcium, Ion: 1.16 mmol/L (ref 1.15–1.40)
Chloride: 103 mmol/L (ref 101–111)
Creatinine, Ser: 1.1 mg/dL — ABNORMAL HIGH (ref 0.44–1.00)
Glucose, Bld: 78 mg/dL (ref 65–99)
HEMATOCRIT: 38 % (ref 36.0–46.0)
HEMOGLOBIN: 12.9 g/dL (ref 12.0–15.0)
Potassium: 3.9 mmol/L (ref 3.5–5.1)
SODIUM: 139 mmol/L (ref 135–145)
TCO2: 24 mmol/L (ref 0–100)

## 2016-02-13 LAB — COMPREHENSIVE METABOLIC PANEL
ALBUMIN: 4.3 g/dL (ref 3.5–5.0)
ALT: 18 U/L (ref 14–54)
ANION GAP: 9 (ref 5–15)
AST: 39 U/L (ref 15–41)
Alkaline Phosphatase: 62 U/L (ref 38–126)
BUN: 20 mg/dL (ref 6–20)
CHLORIDE: 105 mmol/L (ref 101–111)
CO2: 25 mmol/L (ref 22–32)
Calcium: 9.9 mg/dL (ref 8.9–10.3)
Creatinine, Ser: 1.09 mg/dL — ABNORMAL HIGH (ref 0.44–1.00)
GFR calc Af Amer: >60 mL/min (ref >60)
GFR calc non Af Amer: 58 mL/min — ABNORMAL LOW (ref >60)
GLUCOSE: 78 mg/dL (ref 65–99)
POTASSIUM: 3.9 mmol/L (ref 3.5–5.1)
Sodium: 139 mmol/L (ref 135–145)
Total Bilirubin: 1.3 mg/dL — ABNORMAL HIGH (ref 0.3–1.2)
Total Protein: 8 g/dL (ref 6.5–8.1)

## 2016-02-13 LAB — ETHANOL: ALCOHOL ETHYL (B): 6 mg/dL — AB (ref <5)

## 2016-02-13 LAB — CBG MONITORING, ED
GLUCOSE-CAPILLARY: 78 mg/dL (ref 65–99)
GLUCOSE-CAPILLARY: 115 mg/dL — AB (ref 65–99)
GLUCOSE-CAPILLARY: 167 mg/dL — AB (ref 65–99)

## 2016-02-13 LAB — SALICYLATE LEVEL: Salicylate Lvl: <4 mg/dL (ref 2.8–30.0)

## 2016-02-13 LAB — GLUCOSE, CAPILLARY: Glucose-Capillary: 120 mg/dL — ABNORMAL HIGH (ref 65–99)

## 2016-02-13 MED ORDER — LORAZEPAM 1 MG PO TABS: 1.0000 mg | ORAL_TABLET | Freq: Three times a day (TID) | ORAL | Status: DC | PRN

## 2016-02-13 MED ORDER — INSULIN ASPART 100 UNIT/ML ~~LOC~~ SOLN
0.0000 [IU] | Freq: Three times a day (TID) | SUBCUTANEOUS | Status: DC
  Administered 2016-02-13: 3 [IU] via SUBCUTANEOUS
  Filled 2016-02-13: qty 1

## 2016-02-13 MED ORDER — FUROSEMIDE 40 MG PO TABS: 40.0000 mg | ORAL_TABLET | Freq: Every day | ORAL | Status: DC

## 2016-02-13 MED ORDER — ATORVASTATIN CALCIUM 40 MG PO TABS
40.0000 mg | ORAL_TABLET | Freq: Every evening | ORAL | Status: DC
  Administered 2016-02-13: 40 mg via ORAL
  Filled 2016-02-13 (×2): qty 1

## 2016-02-13 MED ORDER — INSULIN GLARGINE 100 UNIT/ML ~~LOC~~ SOLN
30.0000 [IU] | Freq: Every day | SUBCUTANEOUS | Status: DC
  Filled 2016-02-13: qty 0.3

## 2016-02-13 MED ORDER — NICOTINE 21 MG/24HR TD PT24
21.0000 mg | MEDICATED_PATCH | Freq: Every day | TRANSDERMAL | Status: DC
  Administered 2016-02-13: 21 mg via TRANSDERMAL
  Filled 2016-02-13: qty 1

## 2016-02-13 MED ORDER — METFORMIN HCL 500 MG PO TABS
1000.0000 mg | ORAL_TABLET | Freq: Two times a day (BID) | ORAL | Status: DC
  Administered 2016-02-13 (×2): 1000 mg via ORAL
  Filled 2016-02-13 (×2): qty 2

## 2016-02-13 MED ORDER — LORATADINE 10 MG PO TABS
10.0000 mg | ORAL_TABLET | Freq: Every day | ORAL | Status: DC
  Administered 2016-02-13: 10 mg via ORAL
  Filled 2016-02-13: qty 1

## 2016-02-13 MED ORDER — ASPIRIN 81 MG PO CHEW
81.0000 mg | CHEWABLE_TABLET | Freq: Every day | ORAL | Status: DC
  Administered 2016-02-13: 81 mg via ORAL
  Filled 2016-02-13: qty 1

## 2016-02-13 MED ORDER — ATENOLOL 100 MG PO TABS
100.0000 mg | ORAL_TABLET | Freq: Every day | ORAL | Status: DC
  Administered 2016-02-13: 100 mg via ORAL
  Filled 2016-02-13: qty 1

## 2016-02-13 MED ORDER — ONDANSETRON HCL 4 MG PO TABS: 4.0000 mg | ORAL_TABLET | Freq: Three times a day (TID) | ORAL | Status: DC | PRN

## 2016-02-13 MED ORDER — LINAGLIPTIN 5 MG PO TABS
5.0000 mg | ORAL_TABLET | Freq: Every day | ORAL | Status: DC
Start: 2016-02-13 — End: 2016-02-13
  Administered 2016-02-13: 5 mg via ORAL
  Filled 2016-02-13: qty 1

## 2016-02-13 MED ORDER — SODIUM CHLORIDE 0.9 % IV BOLUS (SEPSIS)
1000.0000 mL | Freq: Once | INTRAVENOUS | Status: AC
  Administered 2016-02-13: 1000 mL via INTRAVENOUS

## 2016-02-13 MED ORDER — IBUPROFEN 200 MG PO TABS: 600.0000 mg | ORAL_TABLET | Freq: Three times a day (TID) | ORAL | Status: DC | PRN

## 2016-02-13 MED ORDER — FLUOXETINE HCL 20 MG PO CAPS
40.0000 mg | ORAL_CAPSULE | Freq: Every day | ORAL | Status: DC
  Administered 2016-02-13: 40 mg via ORAL
  Filled 2016-02-13: qty 2

## 2016-02-13 MED ORDER — ALUM & MAG HYDROXIDE-SIMETH 200-200-20 MG/5ML PO SUSP: 30.0000 mL | ORAL | Status: DC | PRN

## 2016-02-13 MED ORDER — QUETIAPINE FUMARATE 300 MG PO TABS: 600.0000 mg | ORAL_TABLET | Freq: Every day | ORAL | Status: DC

## 2016-02-13 MED ORDER — TRAZODONE HCL 50 MG PO TABS: 125.0000 mg | ORAL_TABLET | Freq: Every day | ORAL | Status: DC

## 2016-02-13 MED ORDER — AMLODIPINE BESYLATE 5 MG PO TABS
5.0000 mg | ORAL_TABLET | Freq: Every day | ORAL | Status: DC
  Administered 2016-02-13: 5 mg via ORAL
  Filled 2016-02-13: qty 1

## 2016-02-13 MED ORDER — GABAPENTIN 400 MG PO CAPS
400.0000 mg | ORAL_CAPSULE | Freq: Three times a day (TID) | ORAL | Status: DC
  Administered 2016-02-13 (×2): 400 mg via ORAL
  Filled 2016-02-13 (×2): qty 1
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
## 2016-02-13 NOTE — Tx Team (Signed)
Initial Treatment Plan 02/13/2016 9:54 PM Maureen Swanson [id number]    PATIENT STRESSORS: Financial difficulties Substance abuse   PATIENT STRENGTHS: Average or above average intelligence Communication skills Motivation for treatment/growth   PATIENT IDENTIFIED PROBLEMS: At risk for suicide  Substance Abuse  "self-esteem"  "Depression"               DISCHARGE CRITERIA:  Ability to meet basic life and health needs Adequate post-discharge living arrangements Improved stabilization in mood, thinking, and/or behavior Motivation to continue treatment in a less acute level of care Need for constant or close observation no longer present Verbal commitment to aftercare and medication compliance Withdrawal symptoms are absent or subacute and managed without 24-hour nursing intervention  PRELIMINARY DISCHARGE PLAN: Attend 12-step recovery group Outpatient therapy Placement in alternative living arrangements  PATIENT/FAMILY INVOLVEMENT: This treatment plan has been presented to and reviewed with the patient, Maureen Swanson.  The patient and family have been given the opportunity to ask questions and make suggestions.  Dustin Flock, RN 02/13/2016, 9:54 PM

## 2016-02-13 NOTE — ED Notes (Signed)
Report given to Opal Sidles at [facility]; [facility] contacted for transport
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
## 2016-02-13 NOTE — ED Notes (Signed)
Bed: [id number] Expected date:  Expected time:  Means of arrival:  Comments: Room 17
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
## 2016-02-13 NOTE — BH Assessment (Signed)
[facility] Assessment Progress Note  Per Corena Pilgrim, MD, this pt requires psychiatric hospitalization at this time.  Ria Comment, RN, [facility] has assigned pt to [facility] Rm 500-2; they will be ready to receive pt at 19:00.  Pt has signed Voluntary Admission and Consent for Treatment, as well as Consent to Release Information to the [facility] ACT Team and to [facility] the [place], her outpatient providers, and notification calls have been placed.  Signed forms have been faxed to [facility].  Pt's nurse, Marcie Bal, has been notified, and agrees to send original paperwork along with pt via [facility], and to call report to [phone].  Jalene Mullet, [facility] Triage Specialist [phone]

## 2016-02-13 NOTE — ED Notes (Addendum)
Pt states belongings brought with her to the ED last night included tan/brown backpack, grey shorts, red shirt, new balance sneakers, and a hat; Also, wallet containing ID, EMT card, Social Security cards, and cell phone.

## 2016-02-13 NOTE — ED Provider Notes (Signed)
[facility] DEPT Provider Note   CSN: [id number] Arrival date & time: 02/13/16  [id number]     History   Chief Complaint Chief Complaint  Patient presents with  . Suicidal  . Drug Overdose    HPI Maureen Swanson is a 50 y.o. female.  The history is provided by the patient.  Drug Overdose  This is a recurrent problem. The current episode started 3 to 5 hours ago. The problem occurs constantly. The problem has not changed since onset.Pertinent negatives include no chest pain and no abdominal pain. Nothing aggravates the symptoms. Nothing relieves the symptoms. She has tried nothing for the symptoms. The treatment provided no relief.  Took 5# 100 mg trazedone, 5# 400 mg Seroquel, 3# tylenol, 3# 40 ounce beers.  2 days ago used crack cocaine.  Intentional overdose secondary to fight with boyfriend.    Past Medical History:  Diagnosis Date  . Anxiety   . Arthritis    back and knees  . Asthma    daily and prn inhalers  . 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 Bipolar 1 disorder ([place])   . CHF (congestive heart failure) ([place])   . Depression   . Diabetes mellitus    diet-controlled  . Gastric ulcer   . GERD (gastroesophageal reflux disease)   . Gout   . Headache(784.0)    migraines  . High cholesterol   . Hypertension    under control, has been on med. x 12 yrs.  . Substance abuse    crack cocaine  . TMJ (temporomandibular joint disorder)     Patient Active Problem List   Diagnosis Date Noted  . Cannabis use disorder, moderate, dependence ([place]) 11/20/2015  . Homicidal ideation   . Tobacco use disorder 07/20/2015  . Diabetes mellitus ([place]) 07/20/2015  . Suicidal ideation   . Cocaine use disorder, moderate, dependence ([place]) 04/03/2015  . Schizoaffective disorder, bipolar type ([place]) 04/03/2015  . Alcohol use disorder, moderate, dependence ([place]) 04/03/2015  . Acute ischemic stroke ([place]) 12/08/2014  . Left-sided weakness 12/08/2014  . Atypical ductal  hyperplasia of breast 04/12/2012  . Knee pain 10/17/2010  . MAMMOGRAM, ABNORMAL, RIGHT 08/05/2010  . Dyslipidemia 06/21/2010  . AMENORRHEA, SECONDARY 10/29/2009  . HERPES ZOSTER 08/20/2009  . GERD 10/11/2007  . Essential hypertension 02/26/2007    Past Surgical History:  Procedure Laterality Date  . BREAST LUMPECTOMY WITH NEEDLE LOCALIZATION  04/19/2012   Procedure: BREAST LUMPECTOMY WITH NEEDLE LOCALIZATION;  Surgeon: [name], MD;  Location: [facility];  Service: General;  Laterality: Right;  . KNEE ARTHROSCOPY W/ PARTIAL MEDIAL MENISCECTOMY  05/01/2010   right    OB History    Gravida Para Term Preterm AB Living   3 2 2   1 2    SAB TAB Ectopic Multiple Live Births   1               Home Medications    Prior to Admission medications   Medication Sig Start Date End Date Taking? Authorizing Provider  amLODipine (NORVASC) 5 MG tablet Take 1 tablet (5 mg total) by mouth daily. For high blood pressure 11/28/15   Encarnacion Slates, NP  ARIPiprazole 400 MG SUSR Inject 400 mg into the muscle every 28 (twenty-eight) days. (Due to be administered on 12-19-15): For mood control 12/19/15   Encarnacion Slates, NP  aspirin 81 MG chewable tablet Chew 1 tablet (81 mg total) by mouth daily. For heart  health 11/28/15   Encarnacion Slates, NP  atenolol (TENORMIN) 100 MG tablet Take 1 tablet (100 mg total) by mouth daily. For high blood pressure 11/28/15   Encarnacion Slates, NP  atorvastatin (LIPITOR) 40 MG tablet Take 1 tablet (40 mg total) by mouth every evening. For high cholesterol 11/28/15   Encarnacion Slates, NP  cetirizine (ZYRTEC) 10 MG tablet Take 1 tablet (10 mg total) by mouth daily. (May purchase this medicines from over the counter at yr local pharmacy): For allergies 11/28/15   Encarnacion Slates, NP  FLUoxetine (PROZAC) 40 MG capsule Take 1 capsule (40 mg total) by mouth daily. For depression 11/28/15   Encarnacion Slates, NP  gabapentin (NEURONTIN) 400 MG capsule Take 1 capsule (400 mg total) by  mouth 3 (three) times daily. For agitation 11/28/15   Encarnacion Slates, NP  insulin glargine (LANTUS) 100 UNIT/ML injection Inject 0.3 mLs (30 Units total) into the skin at bedtime. For diabetes management 11/28/15   Encarnacion Slates, NP  linagliptin (TRADJENTA) 5 MG TABS tablet Take 1 tablet (5 mg total) by mouth daily. For diabetes management 11/28/15   Encarnacion Slates, NP  metFORMIN (GLUCOPHAGE) 1000 MG tablet Take 1 tablet (1,000 mg total) by mouth 2 (two) times daily with a meal. For diabetes management 11/28/15   Encarnacion Slates, NP  metFORMIN (GLUCOPHAGE) 1000 MG tablet Take 1 tablet (1,000 mg total) by mouth 2 (two) times daily with a meal. 11/28/15   Encarnacion Slates, NP  nicotine (NICODERM CQ - DOSED IN MG/24 HOURS) 21 mg/24hr patch Place 1 patch (21 mg total) onto the skin daily at 6 (six) AM. For smoking cessation 11/28/15   Encarnacion Slates, NP  QUEtiapine (SEROQUEL) 300 MG tablet Take 2 tablets (600 mg total) by mouth at bedtime. For mood control 11/28/15   Encarnacion Slates, NP  traZODone (DESYREL) 150 MG tablet Take 1 tablet (150 mg total) by mouth at bedtime. For sleep 11/28/15   Encarnacion Slates, NP    Family History Family History  Problem Relation Age of Onset  . Diabetes Mother   . Breast cancer Mother   . Cancer Father   . Bipolar disorder Maternal Aunt   . Schizophrenia Maternal Grandfather   . Alcoholism Maternal Uncle     Social History Social History  Substance Use Topics  . Smoking status: Current Every Day Smoker    Packs/day: 1.00    Years: 0.00    Types: Cigarettes  . Smokeless tobacco: Never Used  . Alcohol use 0.0 oz/week     Comment: reports she drinks 3 40 ounce beers daily     Allergies   Chocolate; Orange; Penicillins; Other; and Tomato   Review of Systems Review of Systems  Cardiovascular: Negative for chest pain.  Gastrointestinal: Negative for abdominal pain.  Psychiatric/Behavioral: Positive for suicidal ideas. Negative for behavioral problems and hallucinations.    All other systems reviewed and are negative.    Physical Exam Updated Vital Signs BP (!) 155/110 (BP Location: Left Arm) Comment: RN aware  Pulse 90   Temp 99.1 F (37.3 C) (Oral)   Resp 24   LMP  (Approximate) Comment: 2010  SpO2 97%   Physical Exam  Constitutional: She is oriented to person, place, and time. She appears well-developed and well-nourished.  HENT:  Head: Normocephalic and atraumatic.  Mouth/Throat: No oropharyngeal exudate.  Eyes: EOM are normal. Pupils are equal, round, and reactive to light.  Neck: Normal  range of motion. Neck supple.  Cardiovascular: Normal rate, regular rhythm and intact distal pulses.   Pulmonary/Chest: Effort normal and breath sounds normal. No respiratory distress.  Abdominal: Soft. Bowel sounds are normal. She exhibits no mass. There is no tenderness. There is no guarding.  Musculoskeletal: Normal range of motion.  Neurological: She is alert and oriented to person, place, and time.  Skin: Skin is warm and dry. Capillary refill takes less than 2 seconds. She is not diaphoretic.  Psychiatric: She expresses suicidal ideation.     ED Treatments / Results  Labs (all labs ordered are listed, but only abnormal results are displayed) Labs Reviewed  COMPREHENSIVE METABOLIC PANEL  ETHANOL  SALICYLATE LEVEL  ACETAMINOPHEN LEVEL  CBC  URINE RAPID DRUG SCREEN, HOSP PERFORMED  MAGNESIUM  BLOOD GAS, ARTERIAL  CBG MONITORING, ED  I-STAT CHEM 8, ED    EKG  EKG Interpretation None       Radiology No results found.  Procedures Procedures (including critical care time)  Medications Ordered in ED Medications  sodium chloride 0.9 % bolus 1,000 mL (not administered)     Initial Impression / Assessment and Plan / ED Course  I have reviewed the triage vital signs and the nursing notes.  Pertinent labs & imaging results that were available during my care of the patient were reviewed by me and considered in my medical decision  making (see chart for details).  Clinical Course    Vitals:   02/13/16 0343  BP: (!) 155/110  Pulse: 90  Resp: 24  Temp: 99.1 F (37.3 C)     Final Clinical Impressions(s) / ED Diagnoses   Final diagnoses:  None   ED ECG REPORT   Date: 02/13/2016  Rate: 82  Rhythm: sinus arrhythmia  QRS Axis: normal  Intervals: normal  ST/T Wave abnormalities: nonspecific ST changes  Conduction Disutrbances:none  Narrative Interpretation: motion in multiple leads,    Old EKG Reviewed: none available  I have personally reviewed the EKG tracing and agree with the computerized printout as noted.  New Prescriptions New Prescriptions   No medications on file   Date: 02/13/2016  Rate: 78  Rhythm: normal sinus rhythm  QRS Axis: normal  Intervals: normal  ST/T Wave abnormalities: normal  Conduction Disutrbances: none  Narrative Interpretation: unremarkable  Without intervention I suspect the first EKG was with movement Results for orders placed or performed during the hospital encounter of 02/13/16  Comprehensive metabolic panel  Result Value Ref Range   Sodium 139 135 - 145 mmol/L   Potassium 3.9 3.5 - 5.1 mmol/L   Chloride 105 101 - 111 mmol/L   CO2 25 22 - 32 mmol/L   Glucose, Bld 78 65 - 99 mg/dL   BUN 20 6 - 20 mg/dL   Creatinine, Ser 1.09 (H) 0.44 - 1.00 mg/dL   Calcium 9.9 8.9 - 10.3 mg/dL   Total Protein 8.0 6.5 - 8.1 g/dL   Albumin 4.3 3.5 - 5.0 g/dL   AST 39 15 - 41 U/L   ALT 18 14 - 54 U/L   Alkaline Phosphatase 62 38 - 126 U/L   Total Bilirubin 1.3 (H) 0.3 - 1.2 mg/dL   GFR calc non Af Amer 58 (L) >60 mL/min   GFR calc Af Amer >60 >60 mL/min   Anion gap 9 5 - 15  Ethanol  Result Value Ref Range   Alcohol, Ethyl (B) 6 (H) <5 mg/dL  Salicylate level  Result Value Ref Range   Salicylate  Lvl <4.0 2.8 - 30.0 mg/dL  Acetaminophen level  Result Value Ref Range   Acetaminophen (Tylenol), Serum <10 (L) 10 - 30 ug/mL  cbc  Result Value Ref Range   WBC 10.2 4.0 -  10.5 K/uL   RBC 3.97 3.87 - 5.11 MIL/uL   Hemoglobin 11.4 (L) 12.0 - 15.0 g/dL   HCT 35.2 (L) 36.0 - 46.0 %   MCV 88.7 78.0 - 100.0 fL   MCH 28.7 26.0 - 34.0 pg   MCHC 32.4 30.0 - 36.0 g/dL   RDW 13.2 11.5 - 15.5 %   Platelets 221 150 - 400 K/uL  Rapid urine drug screen (hospital performed)  Result Value Ref Range   Opiates NONE DETECTED NONE DETECTED   Cocaine POSITIVE (A) NONE DETECTED   Benzodiazepines NONE DETECTED NONE DETECTED   Amphetamines POSITIVE (A) NONE DETECTED   Tetrahydrocannabinol NONE DETECTED NONE DETECTED   Barbiturates NONE DETECTED NONE DETECTED  Blood gas, arterial ([facility] & [facility] ONLY)  Result Value Ref Range   FIO2 0.21    pH, Arterial 7.386 7.350 - 7.450   pCO2 arterial 40.8 32.0 - 48.0 mmHg   pO2, Arterial 79.4 (L) 83.0 - 108.0 mmHg   Bicarbonate 23.8 20.0 - 28.0 mmol/L   Acid-base deficit 0.5 0.0 - 2.0 mmol/L   O2 Saturation 94.3 %   Patient temperature 99.1    Collection site RIGHT RADIAL    Drawn by [id number]    Sample type ARTERIAL DRAW    Allens test (pass/fail) PASS PASS  CBG monitoring, ED  Result Value Ref Range   Glucose-Capillary 78 65 - 99 mg/dL  I-stat chem 8, ed  Result Value Ref Range   Sodium 139 135 - 145 mmol/L   Potassium 3.9 3.5 - 5.1 mmol/L   Chloride 103 101 - 111 mmol/L   BUN 20 6 - 20 mg/dL   Creatinine, Ser 1.10 (H) 0.44 - 1.00 mg/dL   Glucose, Bld 78 65 - 99 mg/dL   Calcium, Ion 1.16 1.15 - 1.40 mmol/L   TCO2 24 0 - 100 mmol/L   Hemoglobin 12.9 12.0 - 15.0 g/dL   HCT 38.0 36.0 - 46.0 %   No results found.  Medications  sodium chloride 0.9 % bolus 1,000 mL (1,000 mLs Intravenous New Bag/Given 02/13/16 0430)    Clinically clear from overdose per poison control (Tiffany RN called).  Will need inpatient stabilization     Wallie Lagrand, MD 02/13/16 [phone]

## 2016-02-14 ENCOUNTER — Encounter (HOSPITAL_COMMUNITY): Payer: Self-pay | Admitting: Psychiatry

## 2016-02-14 DIAGNOSIS — F259 Schizoaffective disorder, unspecified: Secondary | ICD-10-CM | POA: Diagnosis present

## 2016-02-14 LAB — GLUCOSE, CAPILLARY
GLUCOSE-CAPILLARY: 104 mg/dL — AB (ref 65–99)
Glucose-Capillary: 129 mg/dL — ABNORMAL HIGH (ref 65–99)
Glucose-Capillary: 170 mg/dL — ABNORMAL HIGH (ref 65–99)

## 2016-02-14 MED ORDER — ALUM & MAG HYDROXIDE-SIMETH 200-200-20 MG/5ML PO SUSP: 30.0000 mL | ORAL | Status: DC | PRN

## 2016-02-14 MED ORDER — CHLORDIAZEPOXIDE HCL 25 MG PO CAPS
25.0000 mg | ORAL_CAPSULE | Freq: Three times a day (TID) | ORAL | Status: AC
  Administered 2016-02-15 (×2): 25 mg via ORAL
  Filled 2016-02-14 (×3): qty 1

## 2016-02-14 MED ORDER — INSULIN ASPART 100 UNIT/ML ~~LOC~~ SOLN: 0.0000 [IU] | Freq: Every day | SUBCUTANEOUS | Status: DC

## 2016-02-14 MED ORDER — ZOLPIDEM TARTRATE 5 MG PO TABS
5.0000 mg | ORAL_TABLET | Freq: Every evening | ORAL | Status: DC | PRN
  Administered 2016-02-14 – 2016-02-20 (×7): 5 mg via ORAL
  Filled 2016-02-14 (×7): qty 1

## 2016-02-14 MED ORDER — CHLORDIAZEPOXIDE HCL 25 MG PO CAPS
25.0000 mg | ORAL_CAPSULE | ORAL | Status: AC
  Administered 2016-02-14 – 2016-02-15 (×2): 25 mg via ORAL

## 2016-02-14 MED ORDER — INSULIN ASPART 100 UNIT/ML ~~LOC~~ SOLN
0.0000 [IU] | Freq: Three times a day (TID) | SUBCUTANEOUS | Status: DC
  Administered 2016-02-14: 2 [IU] via SUBCUTANEOUS
  Administered 2016-02-19: 0 [IU] via SUBCUTANEOUS
  Administered 2016-02-20: 2 [IU] via SUBCUTANEOUS
  Administered 2016-02-21: 3 [IU] via SUBCUTANEOUS
  Administered 2016-02-22: 8 [IU] via SUBCUTANEOUS
  Administered 2016-02-22 – 2016-02-23 (×2): 2 [IU] via SUBCUTANEOUS
  Administered 2016-02-23: 0 [IU] via SUBCUTANEOUS
  Administered 2016-02-24: 3 [IU] via SUBCUTANEOUS
  Administered 2016-02-24: 2 [IU] via SUBCUTANEOUS

## 2016-02-14 MED ORDER — HYDROXYZINE HCL 25 MG PO TABS
25.0000 mg | ORAL_TABLET | Freq: Four times a day (QID) | ORAL | Status: AC | PRN
  Administered 2016-02-14 – 2016-02-16 (×4): 25 mg via ORAL
  Filled 2016-02-14 (×4): qty 1

## 2016-02-14 MED ORDER — CHLORDIAZEPOXIDE HCL 25 MG PO CAPS
25.0000 mg | ORAL_CAPSULE | Freq: Four times a day (QID) | ORAL | Status: AC
  Administered 2016-02-14 (×4): 25 mg via ORAL
  Filled 2016-02-14 (×4): qty 1

## 2016-02-14 MED ORDER — VITAMIN B-1 100 MG PO TABS
100.0000 mg | ORAL_TABLET | Freq: Every day | ORAL | Status: DC
  Administered 2016-02-14 – 2016-02-25 (×12): 100 mg via ORAL
  Filled 2016-02-14 (×15): qty 1

## 2016-02-14 MED ORDER — CHLORDIAZEPOXIDE HCL 25 MG PO CAPS
25.0000 mg | ORAL_CAPSULE | Freq: Every day | ORAL | Status: AC
  Administered 2016-02-17: 25 mg via ORAL
  Filled 2016-02-14: qty 1

## 2016-02-14 MED ORDER — LORATADINE 10 MG PO TABS
10.0000 mg | ORAL_TABLET | Freq: Every day | ORAL | Status: DC
  Administered 2016-02-14 – 2016-02-25 (×12): 10 mg via ORAL
  Filled 2016-02-14 (×14): qty 1

## 2016-02-14 MED ORDER — INSULIN GLARGINE 100 UNIT/ML ~~LOC~~ SOLN
30.0000 [IU] | Freq: Every day | SUBCUTANEOUS | Status: DC
  Administered 2016-02-14 – 2016-02-19 (×6): 30 [IU] via SUBCUTANEOUS

## 2016-02-14 MED ORDER — CHLORDIAZEPOXIDE HCL 25 MG PO CAPS
25.0000 mg | ORAL_CAPSULE | Freq: Four times a day (QID) | ORAL | Status: AC | PRN
  Administered 2016-02-16: 25 mg via ORAL
  Filled 2016-02-14: qty 1

## 2016-02-14 MED ORDER — LOPERAMIDE HCL 2 MG PO CAPS: 2.0000 mg | ORAL_CAPSULE | ORAL | Status: AC | PRN

## 2016-02-14 MED ORDER — ARIPIPRAZOLE ER 400 MG IM SUSR
400.0000 mg | INTRAMUSCULAR | Status: DC
  Administered 2016-02-20: 400 mg via INTRAMUSCULAR

## 2016-02-14 MED ORDER — NICOTINE 21 MG/24HR TD PT24
21.0000 mg | MEDICATED_PATCH | Freq: Every day | TRANSDERMAL | Status: DC
  Administered 2016-02-14 – 2016-02-25 (×12): 21 mg via TRANSDERMAL
  Filled 2016-02-14 (×14): qty 1

## 2016-02-14 MED ORDER — METFORMIN HCL 500 MG PO TABS
1000.0000 mg | ORAL_TABLET | Freq: Two times a day (BID) | ORAL | Status: DC
  Administered 2016-02-14 – 2016-02-25 (×23): 1000 mg via ORAL
  Filled 2016-02-14 (×4): qty 2
  Filled 2016-02-14: qty 28
  Filled 2016-02-14 (×13): qty 2
  Filled 2016-02-14: qty 28
  Filled 2016-02-14 (×8): qty 2

## 2016-02-14 MED ORDER — GABAPENTIN 400 MG PO CAPS
400.0000 mg | ORAL_CAPSULE | Freq: Three times a day (TID) | ORAL | Status: DC
  Administered 2016-02-14 – 2016-02-25 (×33): 400 mg via ORAL
  Filled 2016-02-14 (×10): qty 1
  Filled 2016-02-14: qty 21
  Filled 2016-02-14 (×4): qty 1
  Filled 2016-02-14: qty 21
  Filled 2016-02-14 (×13): qty 1
  Filled 2016-02-14: qty 21
  Filled 2016-02-14 (×12): qty 1

## 2016-02-14 MED ORDER — MAGNESIUM HYDROXIDE 400 MG/5ML PO SUSP: 30.0000 mL | Freq: Every day | ORAL | Status: DC | PRN

## 2016-02-14 MED ORDER — LINAGLIPTIN 5 MG PO TABS
5.0000 mg | ORAL_TABLET | Freq: Every day | ORAL | Status: DC
  Administered 2016-02-14 – 2016-02-25 (×12): 5 mg via ORAL
  Filled 2016-02-14 (×8): qty 1
  Filled 2016-02-14: qty 7
  Filled 2016-02-14 (×7): qty 1

## 2016-02-14 MED ORDER — ONDANSETRON 4 MG PO TBDP
4.0000 mg | ORAL_TABLET | Freq: Four times a day (QID) | ORAL | Status: AC | PRN
  Administered 2016-02-15 – 2016-02-16 (×2): 4 mg via ORAL
  Filled 2016-02-14 (×2): qty 1

## 2016-02-14 MED ORDER — ADULT MULTIVITAMIN W/MINERALS CH
1.0000 | ORAL_TABLET | Freq: Every day | ORAL | Status: DC
  Administered 2016-02-14 – 2016-02-25 (×12): 1 via ORAL
  Filled 2016-02-14 (×15): qty 1

## 2016-02-14 MED ORDER — THIAMINE HCL 100 MG/ML IJ SOLN
100.0000 mg | Freq: Once | INTRAMUSCULAR | Status: DC
  Filled 2016-02-14: qty 2

## 2016-02-14 MED ORDER — HYDROXYZINE HCL 25 MG PO TABS: 25.0000 mg | ORAL_TABLET | Freq: Four times a day (QID) | ORAL | Status: DC | PRN

## 2016-02-14 MED ORDER — AMLODIPINE BESYLATE 5 MG PO TABS
5.0000 mg | ORAL_TABLET | Freq: Every day | ORAL | Status: DC
  Administered 2016-02-14 – 2016-02-25 (×12): 5 mg via ORAL
  Filled 2016-02-14 (×6): qty 1
  Filled 2016-02-14: qty 7
  Filled 2016-02-14 (×8): qty 1

## 2016-02-14 MED ORDER — ACETAMINOPHEN 325 MG PO TABS
650.0000 mg | ORAL_TABLET | Freq: Four times a day (QID) | ORAL | Status: DC | PRN
  Administered 2016-02-14 – 2016-02-22 (×10): 650 mg via ORAL
  Filled 2016-02-14 (×10): qty 2

## 2016-02-14 MED ORDER — ATORVASTATIN CALCIUM 40 MG PO TABS
40.0000 mg | ORAL_TABLET | Freq: Every day | ORAL | Status: DC
  Administered 2016-02-14 – 2016-02-24 (×11): 40 mg via ORAL
  Filled 2016-02-14 (×7): qty 1
  Filled 2016-02-14: qty 7
  Filled 2016-02-14 (×6): qty 1

## 2016-02-14 MED ORDER — ACETAMINOPHEN 325 MG PO TABS
ORAL_TABLET | ORAL | Status: AC
  Administered 2016-02-14: 650 mg
  Filled 2016-02-14: qty 2

## 2016-02-14 NOTE — Tx Team (Signed)
Interdisciplinary Treatment and Diagnostic Plan Update  02/14/2016 Time of Session: 3:12 PM  Maureen Swanson MRN: [id number]  Principal Diagnosis: Schizoaffective disorder, bipolar type [facility])  Secondary Diagnoses: Principal Problem:   Schizoaffective disorder, bipolar type ([place]) Active Problems:   Essential hypertension   Cocaine use disorder, moderate, dependence (HCC)   Alcohol use disorder, moderate, dependence (H[facility])   Tobacco use disorder   Diabetes mellitus ([place])   Cannabis use disorder, moderate, dependence ([place])   Current Medications:  Current Facility-Administered Medications  Medication Dose Route Frequency Provider Last Rate Last Dose  . acetaminophen (TYLENOL) 325 MG tablet           . acetaminophen (TYLENOL) tablet 650 mg  650 mg Oral Q6H PRN [name], MD   650 mg at 02/14/16 0806  . alum & mag hydroxide-simeth (MAALOX/MYLANTA) 200-200-20 MG/5ML suspension 30 mL  30 mL Oral Q4H PRN Saramma Eappen, MD      . amLODipine (NORVASC) tablet 5 mg  5 mg Oral Daily Saramma Eappen, MD   5 mg at 02/14/16 1008  . [START ON 02/20/2016] ARIPiprazole ER SUSR 400 mg  400 mg Intramuscular Q28 days [name], MD      . atorvastatin (LIPITOR) tablet 40 mg  40 mg Oral q1800 Saramma Eappen, MD      . chlordiazePOXIDE (LIBRIUM) capsule 25 mg  25 mg Oral Q6H PRN Saramma Eappen, MD      . chlordiazePOXIDE (LIBRIUM) capsule 25 mg  25 mg Oral QID [name], MD   25 mg at 02/14/16 1151   Followed by  . [START ON 02/15/2016] chlordiazePOXIDE (LIBRIUM) capsule 25 mg  25 mg Oral TID [name], MD       Followed by  . [START ON 02/16/2016] chlordiazePOXIDE (LIBRIUM) capsule 25 mg  25 mg Oral BH-qamhs Saramma Eappen, MD   25 mg at 02/14/16 1008   Followed by  . [START ON 02/17/2016] chlordiazePOXIDE (LIBRIUM) capsule 25 mg  25 mg Oral Daily Saramma Eappen, MD      . gabapentin (NEURONTIN) capsule 400 mg  400 mg Oral TID [name], MD   400 mg at 02/14/16 1008  . hydrOXYzine  (ATARAX/VISTARIL) tablet 25 mg  25 mg Oral Q6H PRN Saramma Eappen, MD      . insulin aspart (novoLOG) injection 0-15 Units  0-15 Units Subcutaneous TID WC Saramma Eappen, MD      . insulin aspart (novoLOG) injection 0-5 Units  0-5 Units Subcutaneous QHS Saramma Eappen, MD      . insulin glargine (LANTUS) injection 30 Units  30 Units Subcutaneous QHS Saramma Eappen, MD      . linagliptin (TRADJENTA) tablet 5 mg  5 mg Oral Daily Saramma Eappen, MD   5 mg at 02/14/16 1152  . loperamide (IMODIUM) capsule 2-4 mg  2-4 mg Oral PRN [name], MD      . loratadine (CLARITIN) tablet 10 mg  10 mg Oral Daily [name], MD   10 mg at 02/14/16 1152  . magnesium hydroxide (MILK OF MAGNESIA) suspension 30 mL  30 mL Oral Daily PRN [name], MD      . metFORMIN (GLUCOPHAGE) tablet 1,000 mg  1,000 mg Oral BID WC [name], MD   1,000 mg at 02/14/16 1152  . multivitamin with minerals tablet 1 tablet  1 tablet Oral Daily [name], MD   1 tablet at 02/14/16 1007  . nicotine (NICODERM CQ - dosed in mg/24 hours) patch 21 mg  21 mg  Transdermal Daily [name], MD   21 mg at 02/14/16 1151  . ondansetron (ZOFRAN-ODT) disintegrating tablet 4 mg  4 mg Oral Q6H PRN [name], MD      . thiamine (B-1) injection 100 mg  100 mg Intramuscular Once [name], MD      . Derrill Memo ON 02/15/2016] thiamine (VITAMIN B-1) tablet 100 mg  100 mg Oral Daily Saramma Eappen, MD   100 mg at 02/14/16 1007  . zolpidem (AMBIEN) tablet 5 mg  5 mg Oral QHS PRN [name], MD        PTA Medications: Prescriptions Prior to Admission  Medication Sig Dispense Refill Last Dose  . amLODipine (NORVASC) 5 MG tablet Take 1 tablet (5 mg total) by mouth daily. For high blood pressure 1 tablet 0 Past Month at Unknown time  . ARIPiprazole 400 MG SUSR Inject 400 mg into the muscle every 28 (twenty-eight) days. (Due to be administered on 12-19-15): For mood control 1 each 0 01/24/2016  . aspirin 81 MG chewable tablet  Chew 1 tablet (81 mg total) by mouth daily. For heart health 30 tablet 0 Past Week at Unknown time  . atenolol (TENORMIN) 100 MG tablet Take 1 tablet (100 mg total) by mouth daily. For high blood pressure 30 tablet 0 Past Week at 8am  . atorvastatin (LIPITOR) 40 MG tablet Take 1 tablet (40 mg total) by mouth every evening. For high cholesterol 1 tablet 0 Past Month at Unknown time  . cetirizine (ZYRTEC) 10 MG tablet Take 1 tablet (10 mg total) by mouth daily. (May purchase this medicines from over the counter at yr local pharmacy): For allergies   Past Month at Unknown time  . FLUoxetine (PROZAC) 40 MG capsule Take 1 capsule (40 mg total) by mouth daily. For depression 30 capsule 0 Past Month at Unknown time  . furosemide (LASIX) 40 MG tablet Take 40 mg by mouth daily.   Past Month at Unknown time  . gabapentin (NEURONTIN) 400 MG capsule Take 1 capsule (400 mg total) by mouth 3 (three) times daily. For agitation 90 capsule 0 Past Month at Unknown time  . insulin glargine (LANTUS) 100 UNIT/ML injection Inject 0.3 mLs (30 Units total) into the skin at bedtime. For diabetes management 10 mL 1 Past Month at Unknown time  . linagliptin (TRADJENTA) 5 MG TABS tablet Take 1 tablet (5 mg total) by mouth daily. For diabetes management (Patient not taking: Reported on 02/13/2016) 1 tablet 0 Not Taking at Unknown time  . metFORMIN (GLUCOPHAGE) 1000 MG tablet Take 1 tablet (1,000 mg total) by mouth 2 (two) times daily with a meal. For diabetes management 1 tablet 0 Past Month at Unknown time  . metFORMIN (GLUCOPHAGE) 1000 MG tablet Take 1 tablet (1,000 mg total) by mouth 2 (two) times daily with a meal. (Patient not taking: Reported on 02/13/2016) 1 tablet 0 Not Taking at Unknown time  . nicotine (NICODERM CQ - DOSED IN MG/24 HOURS) 21 mg/24hr patch Place 1 patch (21 mg total) onto the skin daily at 6 (six) AM. For smoking cessation 28 patch 0 Past Month at Unknown time  . QUEtiapine (SEROQUEL) 300 MG tablet Take 2  tablets (600 mg total) by mouth at bedtime. For mood control 120 tablet 0 02/12/2016 at Unknown time  . sitaGLIPtin (JANUVIA) 25 MG tablet Take 25 mg by mouth daily.   Past Month at Unknown time  . traZODone (DESYREL) 150 MG tablet Take 1 tablet (150 mg total) by mouth  at bedtime. For sleep 30 tablet 0 02/12/2016 at Unknown time    Treatment Modalities: Medication Management, Group therapy, Case management,  1 to 1 session with clinician, Psychoeducation, Recreational therapy.   Physician Treatment Plan for Primary Diagnosis: Schizoaffective disorder, bipolar type ([place]) Long Term Goal(s): Improvement in symptoms so as ready for discharge  Short Term Goals: Ability to disclose and discuss suicidal ideas  Medication Management: Evaluate patient's response, side effects, and tolerance of medication regimen.  Therapeutic Interventions: 1 to 1 sessions, Unit Group sessions and Medication administration.  Evaluation of Outcomes: Not Met  Physician Treatment Plan for Secondary Diagnosis: Principal Problem:   Schizoaffective disorder, bipolar type ([place]) Active Problems:   Essential hypertension   Cocaine use disorder, moderate, dependence (HCC)   Alcohol use disorder, moderate, dependence (HCC)   Tobacco use disorder   Diabetes mellitus ([place])   Cannabis use disorder, moderate, dependence ([place])   Long Term Goal(s): Improvement in symptoms so as ready for discharge  Short Term Goals: Ability to identify triggers associated with substance abuse/mental health issues will improve  Medication Management: Evaluate patient's response, side effects, and tolerance of medication regimen.  Therapeutic Interventions: 1 to 1 sessions, Unit Group sessions and Medication administration.  Evaluation of Outcomes: Not Met   RN Treatment Plan for Primary Diagnosis: Schizoaffective disorder, bipolar type ([place]) Long Term Goal(s): Knowledge of disease and therapeutic regimen to maintain health will  improve  Short Term Goals: Ability to disclose and discuss suicidal ideas and Ability to identify and develop effective coping behaviors will improve  Medication Management: RN will administer medications as ordered by provider, will assess and evaluate patient's response and provide education to patient for prescribed medication. RN will report any adverse and/or side effects to prescribing provider.  Therapeutic Interventions: 1 on 1 counseling sessions, Psychoeducation, Medication administration, Evaluate responses to treatment, Monitor vital signs and CBGs as ordered, Perform/monitor CIWA, COWS, AIMS and Fall Risk screenings as ordered, Perform wound care treatments as ordered.  Evaluation of Outcomes: Not Met   LCSW Treatment Plan for Primary Diagnosis: Schizoaffective disorder, bipolar type ([place]) Long Term Goal(s): Safe transition to appropriate next level of care at discharge, Engage patient in therapeutic group addressing interpersonal concerns.  Short Term Goals: Engage patient in aftercare planning with referrals and resources and Identify triggers associated with mental health/substance abuse issues  Therapeutic Interventions: Assess for all discharge needs, 1 to 1 time with Social worker, Explore available resources and support systems, Assess for adequacy in community support network, Educate family and significant other(s) on suicide prevention, Complete Psychosocial Assessment, Interpersonal group therapy.  Evaluation of Outcomes: Not Met   Progress in Treatment: Attending groups: Yes Participating in groups: Yes Taking medication as prescribed: Yes Toleration medication: Yes, no side effects reported at this time Family/Significant other contact made: Yes  ACT team Patient understands diagnosis: Yes AEB asking for help with SI and AH Discussing patient identified problems/goals with staff: Yes Medical problems stabilized or resolved: Yes Denies suicidal/homicidal  ideation: Yes Issues/concerns per patient self-inventory: None Other: N/A  New problem(s) identified: None identified at this time.   New Short Term/Long Term Goal(s): None identified at this time.   Discharge Plan or Barriers: Patient requesting referral to [facility], back-up plan unknown at this time.   Reason for Continuation of Hospitalization: Anxiety Depression Hallucinations Medication stabilization Suicidal ideation  Estimated Length of Stay: 3-5 days  Attendees: Patient: 02/14/2016  3:12 PM  Physician: [name], MD 02/14/2016  3:12 PM  Nursing: Benjamine Mola  Hillard Danker, RN 02/14/2016  3:12 PM  RN Care Manager: Lars Pinks, RN 02/14/2016  3:12 PM  Social Worker: Ripley Fraise 02/14/2016  3:12 PM  Recreational Therapist: Marjette  02/14/2016  3:12 PM  Other: Norberto Sorenson 02/14/2016  3:12 PM  Other: [name]  02/14/2016  3:12 PM    Scribe for Treatment Team:  [name] 02/14/2016 3:12 PM
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
## 2016-02-14 NOTE — [facility]HH Group Notes (Signed)
[facility] Group Notes:  (Counselor/Nursing/MHT/Case Management/Adjunct)  02/14/2016 1:15PM  Type of Therapy:  Group Therapy  Participation Level:  Active  Participation Quality:  Appropriate  Affect:  Flat  Cognitive:  Oriented  Insight:  Improving  Engagement in Group:  Limited  Engagement in Therapy:  Limited  Modes of Intervention:  Discussion, Exploration and Socialization  Summary of Progress/Problems: The topic for group was balance in life.  Pt participated in the discussion about when their life was in balance and out of balance and how this feels.  Pt discussed ways to get back in balance and short term goals they can work on to get where they want to be. Stayed the entire time, engaged throughout.  "I'm unbalanced today.  I know that because I am in the hospital.  But I was unbalanced before I came in.  It seems I always get something out of being here, but then I make poor decisions or things fall apart outside of here, and I give up."  Stated she has low self esteem, and cannot identify any positives about herself.  Self others gave her positive feedback-genuine-which she responded to.   [name] 02/14/2016 3:08 PM

## 2016-02-14 NOTE — Progress Notes (Signed)
D: Pt passive SI/ AVH- contracts for safety. Pt is pleasant and cooperative. Pt stated she was feeling so-so. Pt spent most of the evening in her room, but did come out to get her medications.   A: Pt was offered support and encouragement. Pt was given scheduled medications. Pt was encourage to attend groups. Q 15 minute checks were done for safety.   R: Pt is taking medication. Pt has no complaints.Pt receptive to treatment and safety maintained on unit.
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
## 2016-02-14 NOTE — Progress Notes (Signed)
Recreation Therapy Notes  Date: 02/14/16 Time: 1000 Location: 500 Hall Dayroom  Group Topic: Leisure Education  Goal Area(s) Addresses:  Patient will identify positive leisure activities.  Patient will identify one positive benefit of participation in leisure activities.   Behavioral Response: Engaged  Intervention: Visual merchandiser, scissors, glue, colored pencils  Activity: Got Rec?  Patients were to come up with some benefits of leisure.  Patients were to then create a collage of positive leisure activities they like to do or would like to learn to do.  Patients were to use magazines to find pictures to represent the activities they enjoy and glue them to the construction paper.  Education:  Leisure Education, Dentist  Education Outcome: Acknowledges education/In group clarification offered/Needs additional education  Clinical Observations/Feedback: Pt arrived a little late.  Pt identified some of the things she likes as cake, ice cream, fried chicken and salad.  Pt stated she was in control of her own leisure and that makes her feel good and excited.   Victorino Sparrow, LRT/CTRS   Ria Comment, Kongmeng Santoro A 02/14/2016 11:20 AM

## 2016-02-14 NOTE — Progress Notes (Signed)
The focus of this group is to help patients review their daily goal of treatment and discuss progress on daily workbooks.  Patient didn't attend group this evening.

## 2016-02-14 NOTE — Progress Notes (Signed)
D-pt c/o anxiety and depression, pt expresses thoughts of hurting herself but does not have a plan, pt verbally contracts to talk to staff if she feels she may act on her thoughts of hurting herself, pt sts she feels "much better" late this afternoon, pt c/o diabetic pain in her feet A-attended group, took her medications, pt is using a wheelchair to get around the unit R-cont to monitor for safety

## 2016-02-15 LAB — GLUCOSE, CAPILLARY
GLUCOSE-CAPILLARY: 98 mg/dL (ref 65–99)
GLUCOSE-CAPILLARY: 107 mg/dL — AB (ref 65–99)
GLUCOSE-CAPILLARY: 92 mg/dL (ref 65–99)
Glucose-Capillary: 77 mg/dL (ref 65–99)

## 2016-02-15 LAB — MAGNESIUM: MAGNESIUM: 1.4 mg/dL — AB (ref 1.7–2.4)

## 2016-02-15 MED ORDER — DIVALPROEX SODIUM 250 MG PO DR TAB
250.0000 mg | DELAYED_RELEASE_TABLET | Freq: Three times a day (TID) | ORAL | Status: DC
  Administered 2016-02-15 – 2016-02-25 (×27): 250 mg via ORAL
  Filled 2016-02-15 (×7): qty 1
  Filled 2016-02-15: qty 21
  Filled 2016-02-15 (×21): qty 1
  Filled 2016-02-15: qty 21
  Filled 2016-02-15 (×2): qty 1
  Filled 2016-02-15: qty 21
  Filled 2016-02-15 (×3): qty 1

## 2016-02-15 MED ORDER — BENZTROPINE MESYLATE 0.5 MG PO TABS
0.5000 mg | ORAL_TABLET | Freq: Every day | ORAL | Status: DC
  Administered 2016-02-15 – 2016-02-24 (×10): 0.5 mg via ORAL
  Filled 2016-02-15: qty 1
  Filled 2016-02-15: qty 7
  Filled 2016-02-15 (×12): qty 1

## 2016-02-15 MED ORDER — ATENOLOL 50 MG PO TABS
100.0000 mg | ORAL_TABLET | Freq: Every day | ORAL | Status: DC
  Administered 2016-02-15 – 2016-02-25 (×11): 100 mg via ORAL
  Filled 2016-02-15 (×5): qty 1
  Filled 2016-02-15: qty 14
  Filled 2016-02-15: qty 2
  Filled 2016-02-15 (×3): qty 1
  Filled 2016-02-15: qty 2
  Filled 2016-02-15: qty 1
  Filled 2016-02-15 (×3): qty 2

## 2016-02-15 MED ORDER — PERPHENAZINE 2 MG PO TABS
2.0000 mg | ORAL_TABLET | ORAL | Status: DC
  Administered 2016-02-15 – 2016-02-25 (×20): 2 mg via ORAL
  Filled 2016-02-15 (×5): qty 1
  Filled 2016-02-15: qty 14
  Filled 2016-02-15 (×13): qty 1
  Filled 2016-02-15: qty 14
  Filled 2016-02-15 (×5): qty 1
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
## 2016-02-15 NOTE — Progress Notes (Signed)
D: Pt passive SI-contracts for safety.  denies HI/AVH. Pt is pleasant and cooperative. Stated she felt ok tonight, pt did not mingle with peers.  A: Pt was offered support and encouragement. Pt was given scheduled medications. Pt was encourage to attend groups. Q 15 minute checks were done for safety.   R: Pt is taking medication. Pt has no complaints.Pt receptive to treatment and safety maintained on unit.

## 2016-02-15 NOTE — Progress Notes (Signed)
D-she denies SI, but then wrote on her self inventory sheet that she is having suicidial thoughts, she verbally contracts for safety, she seems flat today A-she took her medications & attended group, pt is using her wheelchair about 50% of the time R-cont to monitor for safety

## 2016-02-15 NOTE — [facility] Group Notes (Signed)
[facility] LCSW Group Therapy  02/15/2016  1:05 PM  Type of Therapy:  Group therapy  Participation Level:  Active  Participation Quality:  Attentive  Affect:  Flat  Cognitive:  Oriented  Insight:  Limited  Engagement in Therapy:  Limited  Modes of Intervention:  Discussion, Socialization  Summary of Progress/Problems:  Chaplain was here to lead a group on themes of hope and courage. Slept for a lot of the time.  When asked directly, talked about feeling she gets courage from being in the hospital, "then it disappears when I am not here.  I don't feel like I have stability."  [name] 02/15/2016 12:40 PM

## 2016-02-15 NOTE — Progress Notes (Signed)
Patient ID: Maureen Swanson, female   DOB: 12-08-1965, 50 y.o.   MRN: [id number]  Patient's BP has been elevated, Probation officer spoke with MD. Patient's home medication Atenolol restarted by MD and administered.
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
## 2016-02-15 NOTE — Progress Notes (Signed)
Recreation Therapy Notes  Date: 02/15/16 Time: 1000 Location: 500 Hall Dayroom  Group Topic: Stress Management  Goal Area(s) Addresses:  Patient will verbalize importance of using healthy stress management.  Patient will identify positive emotions associated with healthy stress management.   Behavioral Response: Engaged  Intervention: Stress Management  Activity :  Progressive Muscle Relaxation, Guided Imagery Script.  LRT introduced the stress management techniques of progressive muscle relaxation and guided imagery.  LRT read scripts that guided patients in how to perform the techniques.  Patients were to follow along as LRT read the scripts to engage in the techniques.  Education:  Stress Management, Discharge Planning.   Education Outcome: Acknowledges edcuation/In group clarification offered/Needs additional education  Clinical Observations/Feedback: Pt stated stress cause her to feel anger.  Pt appeared flat and somewhat depressed.  Pt was however engaged and attentive throughout group.     Victorino Sparrow, LRT/CTRS     Victorino Sparrow A 02/15/2016 11:47 AM

## 2016-02-16 LAB — GLUCOSE, CAPILLARY
GLUCOSE-CAPILLARY: 108 mg/dL — AB (ref 65–99)
GLUCOSE-CAPILLARY: 102 mg/dL — AB (ref 65–99)
Glucose-Capillary: 95 mg/dL (ref 65–99)
Glucose-Capillary: 99 mg/dL (ref 65–99)

## 2016-02-16 NOTE — [facility] Group Notes (Signed)
[facility] LCSW Group Therapy  02/16/2016 / 11:00 AM  Type of Therapy:  Group Therapy  Participation Level:  Minimal  Participation Quality:  Attentive  Affect:  Depressed and Flat  Cognitive:  Alert and Oriented  Insight:  None shared  Engagement in Therapy:  Limited  Modes of Intervention:  Discussion, Exploration, Rapport Building, Socialization and Support  Summary of Progress/Problems:   Summary of Progress/Problems: The main focus of today's process group was for the patient to identify ways in which they have in the past sabotaged their own recovery. Motivational Interviewing was utilized to ask the group members what they get out of their self sabotaging behavior(s), and what reasons they may have for wanting to change.  Patient was quietly attentive throughout group as evidenced by her body language and eye contact. Patient shared when directly asked that her biggest challenge is housing and family relationships as she goes from relative to relative sometimes only being able to stay for one night.   Lyla Glassing

## 2016-02-16 NOTE — Progress Notes (Signed)
[facility] MD Progress Note  02/16/2016 1:39 PM Maureen Swanson  MRN:  [id number]   Subjective:  Patient reports " I am depressed because [place] lost my belongings."  Patient continues to endorse suicidal ideations.   Objective: Maureen Swanson is awake, alert and oriented *4, seen sitting in wheelchair  Resting in the dayroom. Patient still endorsing suicidal ideation without a plan. patient denies homicidal ideation. Denies  visual hallucination and does not appear to be responding to internal stimuli. Patient is ruminative regarding clothing and other family stressors. Patient reports she is medication compliant and is tolerating medication well. States her depression 8/10.  Reports good appetite and resting well. Support, encouragement and reassurance was provided.    Principal Problem: Schizoaffective disorder, bipolar type ([place]) Diagnosis:   Patient Active Problem List   Diagnosis Date Noted  . Cannabis use disorder, moderate, dependence ([place]) [F12.20] 11/20/2015  . Homicidal ideation [R45.850]   . Tobacco use disorder [F17.200] 07/20/2015  . Diabetes mellitus ([place]) [E11.9] 07/20/2015  . Cocaine use disorder, moderate, dependence ([place]) [F14.20] 04/03/2015  . Schizoaffective disorder, bipolar type ([place]) [F25.0] 04/03/2015  . Alcohol use disorder, moderate, dependence ([place]) [F10.20] 04/03/2015  . Acute ischemic stroke ([place]) [I63.9] 12/08/2014  . Left-sided weakness [M62.89] 12/08/2014  . 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 Knee pain [M25.569] 10/17/2010  . MAMMOGRAM, ABNORMAL, RIGHT [R92.8] 08/05/2010  . Dyslipidemia [E78.5] 06/21/2010  . AMENORRHEA, SECONDARY [N91.2] 10/29/2009  . 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 GERD [K21.9] 10/11/2007  . Essential hypertension [I10] 02/26/2007   Total Time spent with patient: 25 minutes  Past Psychiatric History: as per H&P.  Past Medical History:  Past Medical History:  Diagnosis Date  . Anxiety   . Arthritis    back  and knees  . Asthma    daily and prn inhalers  . 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 Bipolar 1 disorder ([place])   . CHF (congestive heart failure) ([place])   . Depression   . Diabetes mellitus    diet-controlled  . Gastric ulcer   . GERD (gastroesophageal reflux disease)   . Gout   . Headache(784.0)    migraines  . High cholesterol   . Hypertension    under control, has been on med. x 12 yrs.  . Substance abuse    crack cocaine  . TMJ (temporomandibular joint disorder)     Past Surgical History:  Procedure Laterality Date  . BREAST LUMPECTOMY WITH NEEDLE LOCALIZATION  04/19/2012   Procedure: BREAST LUMPECTOMY WITH NEEDLE LOCALIZATION;  Surgeon: [name], MD;  Location: [facility];  Service: General;  Laterality: Right;  . KNEE ARTHROSCOPY W/ PARTIAL MEDIAL MENISCECTOMY  05/01/2010   right   Family History:  Family History  Problem Relation Age of Onset  . Diabetes Mother   . Breast cancer Mother   . Cancer Father   . Bipolar disorder Maternal Aunt   . Schizophrenia Maternal Grandfather   . Alcoholism Maternal Uncle    Family Psychiatric  History: See Above Social History: As per H&P. History  Alcohol Use  . 0.0 oz/week    Comment: reports she drinks 3 40 ounce beers daily     History  Drug Use  . Types: Cocaine    Comment: 09/28/2015    Social History   Social History  . Marital status: Single    Spouse name: N/A  . Number of children: N/A  .  Years of education: N/A   Social History Main Topics  . Smoking status: Current Every Day Smoker    Packs/day: 1.00    Years: 0.00    Types: Cigarettes  . Smokeless tobacco: Never Used  . Alcohol use 0.0 oz/week     Comment: reports she drinks 3 40 ounce beers daily  . Drug use:     Types: Cocaine     Comment: 09/28/2015  . Sexual activity: No   Other Topics Concern  . None   Social History Narrative  . None   Additional Social History:                          Sleep: Fair   Appetite:  Fair  Current Medications: Current Facility-Administered Medications  Medication Dose Route Frequency Provider Last Rate Last Dose  . acetaminophen (TYLENOL) tablet 650 mg  650 mg Oral Q6H PRN [name], MD   650 mg at 02/16/16 0629  . alum & mag hydroxide-simeth (MAALOX/MYLANTA) 200-200-20 MG/5ML suspension 30 mL  30 mL Oral Q4H PRN Saramma Eappen, MD      . amLODipine (NORVASC) tablet 5 mg  5 mg Oral Daily [name], MD   5 mg at 02/16/16 0842  . [START ON 02/20/2016] ARIPiprazole ER SUSR 400 mg  400 mg Intramuscular Q28 days [name], MD      . atenolol (TENORMIN) tablet 100 mg  100 mg Oral Daily [name], MD   100 mg at 02/16/16 0846  . atorvastatin (LIPITOR) tablet 40 mg  40 mg Oral q1800 [name], MD   40 mg at 02/15/16 1701  . benztropine (COGENTIN) tablet 0.5 mg  0.5 mg Oral QHS [name], MD   0.5 mg at 02/15/16 2057  . chlordiazePOXIDE (LIBRIUM) capsule 25 mg  25 mg Oral Q6H PRN [name], MD      . Derrill Memo ON 02/17/2016] chlordiazePOXIDE (LIBRIUM) capsule 25 mg  25 mg Oral Daily Saramma Eappen, MD      . divalproex (DEPAKOTE) DR tablet 250 mg  250 mg Oral Q8H Saramma Eappen, MD   250 mg at 02/16/16 [id number]  . gabapentin (NEURONTIN) capsule 400 mg  400 mg Oral TID [name], MD   400 mg at 02/16/16 1205  . hydrOXYzine (ATARAX/VISTARIL) tablet 25 mg  25 mg Oral Q6H PRN [name], MD   25 mg at 02/15/16 2058  . insulin aspart (novoLOG) injection 0-15 Units  0-15 Units Subcutaneous TID WC [name], MD   2 Units at 02/14/16 1707  . insulin aspart (novoLOG) injection 0-5 Units  0-5 Units Subcutaneous QHS Saramma Eappen, MD      . insulin glargine (LANTUS) injection 30 Units  30 Units Subcutaneous QHS [name], MD   30 Units at 02/15/16 2059  . linagliptin (TRADJENTA) tablet 5 mg  5 mg Oral Daily [name], MD   5 mg at 02/16/16 0842  . loperamide (IMODIUM) capsule 2-4 mg  2-4 mg Oral PRN [name], MD      . loratadine (CLARITIN) tablet 10 mg  10 mg Oral Daily [name], MD   10 mg at 02/16/16 0842  . magnesium hydroxide (MILK OF MAGNESIA) suspension 30 mL  30 mL Oral Daily PRN [name], MD      . metFORMIN (GLUCOPHAGE) tablet 1,000 mg  1,000 mg Oral BID WC [name], MD   1,000 mg at 02/16/16 0842  . multivitamin with minerals tablet  1 tablet  1 tablet Oral Daily [name], MD   1 tablet at 02/16/16 0842  . nicotine (NICODERM CQ - dosed in mg/24 hours) patch 21 mg  21 mg Transdermal Daily [name], MD   21 mg at 02/16/16 0846  . ondansetron (ZOFRAN-ODT) disintegrating tablet 4 mg  4 mg Oral Q6H PRN [name], MD   4 mg at 02/16/16 0643  . perphenazine (TRILAFON) tablet 2 mg  2 mg Oral BH-qamhs [name], MD   2 mg at 02/16/16 0846  . thiamine (B-1) injection 100 mg  100 mg Intramuscular Once [name], MD      . thiamine (VITAMIN B-1) tablet 100 mg  100 mg Oral Daily [name], MD   100 mg at 02/16/16 [id number]  . zolpidem (AMBIEN) tablet 5 mg  5 mg Oral QHS PRN [name], MD   5 mg at 02/15/16 2057    Lab Results:  Results for orders placed or performed during the hospital encounter of 02/13/16 (from the past 48 hour(s))  Glucose, capillary     Status: Abnormal   Collection Time: 02/14/16  4:50 PM  Result Value Ref Range   Glucose-Capillary 129 (H) 65 - 99 mg/dL  Glucose, capillary     Status: Abnormal   Collection Time: 02/14/16  8:52 PM  Result Value Ref Range   Glucose-Capillary 170 (H) 65 - 99 mg/dL   Comment 1 Notify RN   Glucose, capillary     Status: None   Collection Time: 02/15/16  6:15 AM  Result Value Ref Range   Glucose-Capillary 98 65 - 99 mg/dL  Glucose, capillary     Status: None   Collection Time: 02/15/16 12:03 PM  Result Value Ref Range   Glucose-Capillary 77 65 - 99 mg/dL  Glucose, capillary     Status: Abnormal   Collection Time: 02/15/16  5:19 PM  Result Value Ref Range   Glucose-Capillary 107 (H)  65 - 99 mg/dL   Comment 1 Notify RN    Comment 2 Document in Chart   Magnesium     Status: Abnormal   Collection Time: 02/15/16  6:21 PM  Result Value Ref Range   Magnesium 1.4 (L) 1.7 - 2.4 mg/dL    Comment: Performed at [facility]  Glucose, capillary     Status: None   Collection Time: 02/15/16  8:35 PM  Result Value Ref Range   Glucose-Capillary 92 65 - 99 mg/dL  Glucose, capillary     Status: None   Collection Time: 02/16/16  6:17 AM  Result Value Ref Range   Glucose-Capillary 95 65 - 99 mg/dL  Glucose, capillary     Status: None   Collection Time: 02/16/16 11:47 AM  Result Value Ref Range   Glucose-Capillary 99 65 - 99 mg/dL    Blood Alcohol level:  Lab Results  Component Value Date   ETH 6 (H) 02/13/2016   ETH <5 11/18/2015    Physical Findings: AIMS: Facial and Oral Movements Muscles of Facial Expression: None, normal Lips and Perioral Area: None, normal Jaw: None, normal Tongue: None, normal,Extremity Movements Upper (arms, wrists, hands, fingers): None, normal Lower (legs, knees, ankles, toes): None, normal, Trunk Movements Neck, shoulders, hips: None, normal, Overall Severity Severity of abnormal movements (highest score from questions above): None, normal Incapacitation due to abnormal movements: None, normal Patient's awareness of abnormal movements (rate only patient's report): No Awareness, Dental Status Current problems with teeth and/or dentures?: No Does patient usually  wear dentures?: No  CIWA:  CIWA-Ar Total: 4 COWS:     Musculoskeletal: Strength & Muscle Tone: within normal limits Gait & Station: normal Patient leans: N/A  Psychiatric Specialty Exam: Review of Systems  Musculoskeletal: Positive for myalgias.  Psychiatric/Behavioral: Positive for depression, hallucinations, substance abuse and suicidal ideas. The patient is nervous/anxious.   All other systems reviewed and are negative.   Blood pressure (!) 148/101, pulse  88, temperature 98.9 F (37.2 C), temperature source Oral, resp. rate 20, height 5\' 3"  (1.6 m), weight 95.7 kg (211 lb).Body mass index is 37.38 kg/m.  General Appearance: Casual sitting in wheelchair  Eye Contact::  Fair  Speech:  Clear and Coherent  Volume:  Normal  Mood:  Anxious, Depressed and Dysphoric   Affect:  Depressed  Thought Process:  Goal Directed and Descriptions of Associations: Circumstantial  Orientation:  Full (Time, Place, and Person)  Thought Content:  Hallucinations: Auditory on and off - command   Suicidal Thoughts:  Yes.  without intent/plan  But has command AH asking her to kill self.  Homicidal Thoughts:  No  Memory:  Immediate;   Fair Recent;   Fair Remote;   Fair  Judgement:  Fair  Insight:  Shallow  Psychomotor Activity:  Restlessness  Concentration:  Fair  Recall:  [employer] of Knowledge:Fair  Language: Fair  Akathisia:  No  Handed:  Right  AIMS (if indicated):     Assets:  Communication Skills Desire for Improvement Resilience  ADL's:  Intact  Cognition: WNL  Sleep:  Number of Hours: 6.75    I agree with current treatment plan on 02/16/2016, Patient seen face-to-face for psychiatric evaluation follow-up, chart reviewed. Reviewed the information documented and agree with the treatment plan.  Treatment Plan Summary: Daily contact with patient to assess and evaluate symptoms and progress in treatment and Medication management  Will continue CIWA/Librium protocol for alcohol withdrawal sx. Will continue Abilify Maintena IM 400 mg - last dose per pt was on august 10 th - repeat q28 days. Will continue Gabapentin 400 mg po tid for mood lability/anxiety/pain. Will continue Ambien 10 mg po qhs prn for sleep. Will continue prozac 40 mg po daily for affective sx. Will start Depakote dr 250 mg po q8h for mood sx- depakote level on 02/19/16. Will continueTrilafon 2 mg po bid for psychosis - to augment the abilify maintena IM . Restarted home medications  as needed. Will continue to monitor vitals ,medication compliance and treatment side effects while patient is here.  Will monitor for medical issues as well as call consult as needed.  Reviewed labs cbc - hb - low , cmp- creatinine slightly elevated, TSH - wnl ( 08/10/15) , lipid panel - wnl ( 08/10/15) , hba1c-5.1 ( 11/20/15) ,uds - pos for stimulants , cocaine , BAL - 6, EKG for qtc monitoring - wnl ( 02/14/16) . CSW will continue  working on disposition. Patient referred to a substance abuse treatment program- pending placement. Patient to participate in therapeutic milieu   Derrill [place], NP 02/16/2016, 1:39 PM

## 2016-02-16 NOTE — Progress Notes (Signed)
Nursing Note 02/16/2016 [id number]  Data Monitored inpatient s/p overdose.  Reports sleeping fair without PRN sleep med.  Rates depression 9/10, hopelessness 9/10, and anxiety 9/10. Affect flat/depressed mood depressed.  Denies current HI and SI.  Reports some passive self harm thoughts, but agrees to come to staff before acting on them.  Reports auditory hallucinations, but says they aren't telling her to do anything.  Attending some groups, spends some time in day area.  Interacts with peers; however, minimally. ClO chronic pain in back, and anxiety.  Action  Spoke with patient 1:1, nurse offered support to patient throughout shift.  PRN's given for pain/anxiety per MAR .  Continues to be monitored on 15 minute checks for safety.  Response Pain PRN minimally effective.  Anxiety PRN somewhat effective. Remains safe and appropriate on unit.

## 2016-02-17 LAB — GLUCOSE, CAPILLARY
GLUCOSE-CAPILLARY: 94 mg/dL (ref 65–99)
GLUCOSE-CAPILLARY: 73 mg/dL (ref 65–99)
GLUCOSE-CAPILLARY: 114 mg/dL — AB (ref 65–99)
GLUCOSE-CAPILLARY: 126 mg/dL — AB (ref 65–99)

## 2016-02-17 MED ORDER — MAGNESIUM OXIDE 400 (241.3 MG) MG PO TABS
400.0000 mg | ORAL_TABLET | Freq: Every day | ORAL | Status: DC
  Administered 2016-02-17 – 2016-02-19 (×3): 400 mg via ORAL
  Filled 2016-02-17 (×5): qty 1

## 2016-02-17 NOTE — Progress Notes (Signed)
D: Pt denies SI/HI/AVH. Pt is pleasant and cooperative. Pt stays to herself, but does interact with peers sometimes.   A: Pt was offered support and encouragement. Pt was given scheduled medications. Pt was encourage to attend groups. Q 15 minute checks were done for safety.   R:Pt attends groups and interacts well with peers and staff. Pt is taking medication. Pt has no complaints.Pt receptive to treatment and safety maintained on unit.

## 2016-02-17 NOTE — Progress Notes (Addendum)
D: Patient resting in bed this AM however later in morning was up and visible in the milieu. Spoke with patient 1:1. Rates sleep fair, appetite fair, energy low and concentration poor. Patient's affect flat, blank with depressed mood. Rating her depression at a 9/10, hopelessness at a 9/10 and anxiety at a 9/10. States goal for today is to "learn why I keep meeting bad people." Denies pain, physical problems.   A: Medicated per orders, no prns required, requested. Emotional support offered and self inventory reviewed. Fall precautions in place and reviewed.   R: On reassess, patient verbalizes understanding. Patient +AH but states, "I can't make out what they are saying." While patient has been endorsing passive SI, today she denies SI/HI and remains safe on level III obs.

## 2016-02-17 NOTE — [facility] Group Notes (Signed)
Man Group Notes:  (Nursing/MHT/Case Management/Adjunct)  Date:  02/17/2016  Time:  0930  Type of Therapy:  Nurse Education  Participation Level:  Minimal  Participation Quality:  Attentive  Affect:  Depressed and Flat  Cognitive:  Oriented  Insight:  Limited  Engagement in Group:  Limited  Modes of Intervention:  Discussion, Education and Support  Summary of Progress/Problems:  Patient came in late to group.  Attentive but minimal.  Jamie Kato 02/17/2016, 1000

## 2016-02-17 NOTE — Progress Notes (Signed)
[facility] MD Progress Note  02/17/2016 11:19 AM Maureen Swanson  MRN:  [id number]   Subjective: Patient continues to endorse suicidal ideations "and the voices in my head was louder last night."  Objective: JNAE DONATE is awake, alert and oriented *4, seen sitting in wheelchair  Resting in the dayroom. Patient has a flat affect. Patient still endorsing suicidal ideation without a plan. patient denies homicidal ideation. Patient reports auditory hallucination, states voice are louder at night. denies visual hallucination and does not appear to be responding to internal stimuli. Patient is ruminative regarding clothing and other family stressors. Patient reports her peers have given her clothing "which is making things a little better." Patient reports she is medication compliant and is tolerating medication well. States her depression 10/10.  Reports good appetite and resting well. Support, encouragement and reassurance was provided.    Principal Problem: Schizoaffective disorder, bipolar type ([place]) Diagnosis:   Patient Active Problem List   Diagnosis Date Noted  . Cannabis use disorder, moderate, dependence ([place]) [F12.20] 11/20/2015  . Homicidal ideation [R45.850]   . Tobacco use disorder [F17.200] 07/20/2015  . Diabetes mellitus ([place]) [E11.9] 07/20/2015  . Cocaine use disorder, moderate, dependence ([place]) [F14.20] 04/03/2015  . Schizoaffective disorder, bipolar type ([place]) [F25.0] 04/03/2015  . Alcohol use disorder, moderate, dependence ([place]) [F10.20] 04/03/2015  . Acute ischemic stroke ([place]) [I63.9] 12/08/2014  . Left-sided weakness [M62.89] 12/08/2014  . 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 Knee pain [M25.569] 10/17/2010  . MAMMOGRAM, ABNORMAL, RIGHT [R92.8] 08/05/2010  . Dyslipidemia [E78.5] 06/21/2010  . AMENORRHEA, SECONDARY [N91.2] 10/29/2009  . 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 GERD [K21.9] 10/11/2007  . Essential hypertension [I10] 02/26/2007   Total Time spent  with patient: 25 minutes  Past Psychiatric History: as per H&P.  Past Medical History:  Past Medical History:  Diagnosis Date  . Anxiety   . Arthritis    back and knees  . Asthma    daily and prn inhalers  . 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 Bipolar 1 disorder ([place])   . CHF (congestive heart failure) ([place])   . Depression   . Diabetes mellitus    diet-controlled  . Gastric ulcer   . GERD (gastroesophageal reflux disease)   . Gout   . Headache(784.0)    migraines  . High cholesterol   . Hypertension    under control, has been on med. x 12 yrs.  . Substance abuse    crack cocaine  . TMJ (temporomandibular joint disorder)     Past Surgical History:  Procedure Laterality Date  . BREAST LUMPECTOMY WITH NEEDLE LOCALIZATION  04/19/2012   Procedure: BREAST LUMPECTOMY WITH NEEDLE LOCALIZATION;  Surgeon: [name], MD;  Location: [facility];  Service: General;  Laterality: Right;  . KNEE ARTHROSCOPY W/ PARTIAL MEDIAL MENISCECTOMY  05/01/2010   right   Family History:  Family History  Problem Relation Age of Onset  . Diabetes Mother   . Breast cancer Mother   . Cancer Father   . Bipolar disorder Maternal Aunt   . Schizophrenia Maternal Grandfather   . Alcoholism Maternal Uncle    Family Psychiatric  History: See Above Social History: As per H&P. History  Alcohol Use  . 0.0 oz/week    Comment: reports she drinks 3 40 ounce beers daily     History  Drug Use  . Types: Cocaine    Comment: 09/28/2015    Social  History   Social History  . Marital status: Single    Spouse name: N/A  . Number of children: N/A  . Years of education: N/A   Social History Main Topics  . Smoking status: Current Every Day Smoker    Packs/day: 1.00    Years: 0.00    Types: Cigarettes  . Smokeless tobacco: Never Used  . Alcohol use 0.0 oz/week     Comment: reports she drinks 3 40 ounce beers daily  . Drug use:     Types: Cocaine     Comment:  09/28/2015  . Sexual activity: No   Other Topics Concern  . None   Social History Narrative  . None   Additional Social History:                         Sleep: Fair   Appetite:  Fair  Current Medications: Current Facility-Administered Medications  Medication Dose Route Frequency Provider Last Rate Last Dose  . acetaminophen (TYLENOL) tablet 650 mg  650 mg Oral Q6H PRN [name], MD   650 mg at 02/16/16 1447  . alum & mag hydroxide-simeth (MAALOX/MYLANTA) 200-200-20 MG/5ML suspension 30 mL  30 mL Oral Q4H PRN Saramma Eappen, MD      . amLODipine (NORVASC) tablet 5 mg  5 mg Oral Daily [name], MD   5 mg at 02/17/16 0841  . [START ON 02/20/2016] ARIPiprazole ER SUSR 400 mg  400 mg Intramuscular Q28 days [name], MD      . atenolol (TENORMIN) tablet 100 mg  100 mg Oral Daily [name], MD   100 mg at 02/17/16 0842  . atorvastatin (LIPITOR) tablet 40 mg  40 mg Oral q1800 [name], MD   40 mg at 02/16/16 1658  . benztropine (COGENTIN) tablet 0.5 mg  0.5 mg Oral QHS [name], MD   0.5 mg at 02/16/16 2219  . divalproex (DEPAKOTE) DR tablet 250 mg  250 mg Oral Q8H Saramma Eappen, MD   250 mg at 02/17/16 0644  . gabapentin (NEURONTIN) capsule 400 mg  400 mg Oral TID [name], MD   400 mg at 02/17/16 0842  . insulin aspart (novoLOG) injection 0-15 Units  0-15 Units Subcutaneous TID WC [name], MD   2 Units at 02/14/16 1707  . insulin aspart (novoLOG) injection 0-5 Units  0-5 Units Subcutaneous QHS Saramma Eappen, MD      . insulin glargine (LANTUS) injection 30 Units  30 Units Subcutaneous QHS [name], MD   30 Units at 02/16/16 2221  . linagliptin (TRADJENTA) tablet 5 mg  5 mg Oral Daily [name], MD   5 mg at 02/17/16 0841  . loratadine (CLARITIN) tablet 10 mg  10 mg Oral Daily [name], MD   10 mg at 02/17/16 0841  . magnesium hydroxide (MILK OF MAGNESIA) suspension 30 mL  30 mL Oral Daily PRN [name], MD       . metFORMIN (GLUCOPHAGE) tablet 1,000 mg  1,000 mg Oral BID WC [name], MD   1,000 mg at 02/17/16 0842  . multivitamin with minerals tablet 1 tablet  1 tablet Oral Daily [name], MD   1 tablet at 02/17/16 0841  . nicotine (NICODERM CQ - dosed in mg/24 hours) patch 21 mg  21 mg Transdermal Daily [name], MD   21 mg at 02/17/16 0843  . perphenazine (TRILAFON) tablet 2 mg  2 mg Oral BH-qamhs [name], MD  2 mg at 02/17/16 0842  . thiamine (B-1) injection 100 mg  100 mg Intramuscular Once Saramma Eappen, MD      . thiamine (VITAMIN B-1) tablet 100 mg  100 mg Oral Daily Saramma Eappen, MD   100 mg at 02/17/16 0841  . zolpidem (AMBIEN) tablet 5 mg  5 mg Oral QHS PRN [name], MD   5 mg at 02/16/16 2219    Lab Results:  Results for orders placed or performed during the hospital encounter of 02/13/16 (from the past 48 hour(s))  Glucose, capillary     Status: None   Collection Time: 02/15/16 12:03 PM  Result Value Ref Range   Glucose-Capillary 77 65 - 99 mg/dL  Glucose, capillary     Status: Abnormal   Collection Time: 02/15/16  5:19 PM  Result Value Ref Range   Glucose-Capillary 107 (H) 65 - 99 mg/dL   Comment 1 Notify RN    Comment 2 Document in Chart   Magnesium     Status: Abnormal   Collection Time: 02/15/16  6:21 PM  Result Value Ref Range   Magnesium 1.4 (L) 1.7 - 2.4 mg/dL    Comment: Performed at [facility]  Glucose, capillary     Status: None   Collection Time: 02/15/16  8:35 PM  Result Value Ref Range   Glucose-Capillary 92 65 - 99 mg/dL  Glucose, capillary     Status: None   Collection Time: 02/16/16  6:17 AM  Result Value Ref Range   Glucose-Capillary 95 65 - 99 mg/dL  Glucose, capillary     Status: None   Collection Time: 02/16/16 11:47 AM  Result Value Ref Range   Glucose-Capillary 99 65 - 99 mg/dL  Glucose, capillary     Status: Abnormal   Collection Time: 02/16/16  4:39 PM  Result Value Ref Range    Glucose-Capillary 108 (H) 65 - 99 mg/dL   Comment 1 Notify RN    Comment 2 Document in Chart   Glucose, capillary     Status: Abnormal   Collection Time: 02/16/16  8:14 PM  Result Value Ref Range   Glucose-Capillary 102 (H) 65 - 99 mg/dL  Glucose, capillary     Status: None   Collection Time: 02/17/16  6:17 AM  Result Value Ref Range   Glucose-Capillary 94 65 - 99 mg/dL    Blood Alcohol level:  Lab Results  Component Value Date   ETH 6 (H) 02/13/2016   ETH <5 11/18/2015    Physical Findings: AIMS: Facial and Oral Movements Muscles of Facial Expression: None, normal Lips and Perioral Area: None, normal Jaw: None, normal Tongue: None, normal,Extremity Movements Upper (arms, wrists, hands, fingers): None, normal Lower (legs, knees, ankles, toes): None, normal, Trunk Movements Neck, shoulders, hips: None, normal, Overall Severity Severity of abnormal movements (highest score from questions above): None, normal Incapacitation due to abnormal movements: None, normal Patient's awareness of abnormal movements (rate only patient's report): No Awareness, Dental Status Current problems with teeth and/or dentures?: No Does patient usually wear dentures?: No  CIWA:  CIWA-Ar Total: 1 COWS:     Musculoskeletal: Strength & Muscle Tone: within normal limits Gait & Station: normal Patient leans: N/A  Psychiatric Specialty Exam: Review of Systems  Musculoskeletal: Positive for myalgias.  Psychiatric/Behavioral: Positive for depression, hallucinations, substance abuse and suicidal ideas. The patient is nervous/anxious.   All other systems reviewed and are negative.   Blood pressure (!) 138/92, pulse 85, temperature 99.2 F (37.3 C),  resp. rate 16, height 5\' 3"  (1.6 m), weight 95.7 kg (211 lb).Body mass index is 37.38 kg/m.  General Appearance: Casual sitting in wheelchair  Eye Contact::  Fair  Speech:  Clear and Coherent  Volume:  Normal  Mood:  Anxious, Depressed and Dysphoric    Affect:  Depressed  Thought Process:  Goal Directed and Descriptions of Associations: Circumstantial  Orientation:  Full (Time, Place, and Person)  Thought Content:  Hallucinations: Auditory on and off - command   Suicidal Thoughts:  Yes.  without intent/plan  But has command AH asking her to kill self.  Homicidal Thoughts:  No  Memory:  Immediate;   Fair Recent;   Fair Remote;   Fair  Judgement:  Fair  Insight:  Shallow  Psychomotor Activity:  Restlessness  Concentration:  Fair  Recall:  [employer] of Knowledge:Fair  Language: Fair  Akathisia:  No  Handed:  Right  AIMS (if indicated):     Assets:  Communication Skills Desire for Improvement Resilience  ADL's:  Intact  Cognition: WNL  Sleep:  Number of Hours: 6.25    I agree with current treatment plan on 02/17/2016, Patient seen face-to-face for psychiatric evaluation follow-up, chart reviewed. Reviewed the information documented and agree with the treatment plan.  Treatment Plan Summary: Daily contact with patient to assess and evaluate symptoms and progress in treatment and Medication management  Will continue CIWA/Librium protocol for alcohol withdrawal sx. Will continue Abilify Maintena IM 400 mg - last dose per pt was on august 10 th - repeat q28 days. Will continue Gabapentin 400 mg po tid for mood lability/anxiety/pain. Will continue Ambien 10 mg po qhs prn for sleep. Will continue prozac 40 mg po daily for affective sx. Will start Depakote dr 250 mg po q8h for mood sx- depakote level on 02/19/16. Will continue Trilafon 2 mg po bid for psychosis - to augment the abilify maintena IM . Restarted home medications as needed. Will continue to monitor vitals ,medication compliance and treatment side effects while patient is here.  Will monitor for medical issues as well as call consult as needed.  Reviewed labs cbc - hb - low , cmp- creatinine slightly elevated, TSH - wnl ( 08/10/15) , lipid panel - wnl ( 08/10/15) ,  hba1c-5.1 ( 11/20/15) ,uds - pos for stimulants , cocaine , BAL - 6, EKG for qtc monitoring - wnl ( 02/14/16) .  Results: magnesium  1.4 (low) Start magnesium oxide 400 mg Po QD-repeat lab 9/5 CSW will continue  working on disposition. Patient referred to a substance abuse treatment program- pending placement. Patient to participate in therapeutic milieu   Derrill Center, NP 02/17/2016, 11:19 AM

## 2016-02-17 NOTE — Progress Notes (Signed)
Adult Psychoeducational Group Note  Date:  02/17/2016 Time:  9:43 PM  Group Topic/Focus:  Wrap-Up Group:   The focus of this group is to help patients review their daily goal of treatment and discuss progress on daily workbooks.   Participation Level:  Active  Participation Quality:  Appropriate  Affect:  Appropriate and Irritable  Cognitive:  Appropriate  Insight: Appropriate  Engagement in Group:  Engaged  Modes of Intervention:  Activity  Additional Comments:  Patient rated her day a 7. Goal is to work on self esteem. Maureen Swanson 02/17/2016, 9:43 PM

## 2016-02-17 NOTE — [facility] Group Notes (Signed)
[facility] LCSW Group Therapy  02/17/2016  11:30 to 12 Noon  Type of Therapy:  Group Therapy  Participation Level:  Improved  Participation Quality:  Attentive and Sharing  Affect:  Flat  Cognitive:  Alert  Insight:  Limited  Engagement in Therapy:  Limited  Modes of Intervention:  Activity, Clarification, Exploration, Socialization and Support  Summary of Progress/Problems: Topic for today was thoughts and feelings regarding discharge. We discussed fears of upcoming changes including judgements, expectations and stigma of mental health issues. We then discussed supports: what constitutes a supportive framework, identification of supports and what to do when others are not supportive.  Patient chose a visual to represent decompensation as feeling overwhelmed and improvement as "this smooth path." Patient visibly teared up when asked to describe what a smooth path feels like. Patient shared she has not spoken to any family while here as "it may help to give everyone a break."  Sheilah Pigeon, LCSW

## 2016-02-17 NOTE — Progress Notes (Signed)
D: Pt stated she felt the same. Pt is pleasant and cooperative. Pt stayed in room a lot of the evening.   A: Pt was offered support and encouragement. Pt was given scheduled medications. Pt was encourage to attend groups. Q 15 minute checks were done for safety.   R:Pt attends groups and interacts well with peers and staff. Pt is taking medication. Pt has no complaints.Pt receptive to treatment and safety maintained on unit.

## 2016-02-17 NOTE — Plan of Care (Signed)
Problem: Medication: Goal: Compliance with prescribed medication regimen will improve Outcome: Progressing Patient has been med compliant.  Problem: Safety: Goal: Periods of time without injury will increase Outcome: Progressing Patient has not engaged in self harm. Denying SI this AM and verbally contracts for safety should that change.

## 2016-02-18 LAB — GLUCOSE, CAPILLARY
GLUCOSE-CAPILLARY: 119 mg/dL — AB (ref 65–99)
Glucose-Capillary: 112 mg/dL — ABNORMAL HIGH (ref 65–99)
Glucose-Capillary: 119 mg/dL — ABNORMAL HIGH (ref 65–99)
Glucose-Capillary: 119 mg/dL — ABNORMAL HIGH (ref 65–99)

## 2016-02-18 MED ORDER — HYDROXYZINE HCL 50 MG PO TABS
50.0000 mg | ORAL_TABLET | Freq: Every evening | ORAL | Status: DC | PRN
  Administered 2016-02-18 – 2016-02-24 (×8): 50 mg via ORAL
  Filled 2016-02-18 (×19): qty 1

## 2016-02-18 NOTE — Progress Notes (Signed)
Data. Patient denies SI. Patient does report that she is actively having, "Voices", that, "Are telling me to hurt other people". She does report that she is, "Able to ignore them or resist doing what they say".  She also contracts to come to staff, prior to acting on any commands from the voices. Patient has been isolating in her room most of shift, with the exception of some time in the day room, in the sun and meals. C/O generalized pain.Action. Received PRN pain med for pain. Emotional support and encouragement offered. Education provided on medication, indications and side effect. Q 15 minute checks done for safety. Response. Some relif from pain reported with pain med. Safety on the unit maintained through 15 minute checks.  Medications taken as prescribed. Attended groups. Remained calm and appropriate through out shift.

## 2016-02-18 NOTE — Progress Notes (Signed)
[facility] MD Progress Note  02/18/2016 12:52 PM Maureen Swanson  MRN:  [id number] Subjective:  Patient reports "I still feel down and have AH on and off."    Objective: Maureen Swanson is a  50 y.o. AA female , who has a hx of schizoaffective do , cocaine, alcohol, cannabis abuse, who voluntarily presents to [facility] S/P suicide attempt by OD and command [place]. Maureen Swanson is well known to the [facility] unit .  Maureen Swanson today continues to have anxiety and depression and appears withdrawn although making some progress. Maureen Swanson reports AH as improved . Maureen Swanson continues need encouragement and support.   Per staff -Maureen Swanson is tolerating medications well, denies any ADRs.       Principal Problem: Schizoaffective disorder, bipolar type ([place]) Diagnosis:   Patient Active Problem List   Diagnosis Date Noted  . Cannabis use disorder, moderate, dependence ([place]) [F12.20] 11/20/2015  . Homicidal ideation [R45.850]   . Tobacco use disorder [F17.200] 07/20/2015  . Diabetes mellitus ([place]) [E11.9] 07/20/2015  . Cocaine use disorder, moderate, dependence ([place]) [F14.20] 04/03/2015  . Schizoaffective disorder, bipolar type ([place]) [F25.0] 04/03/2015  . Alcohol use disorder, moderate, dependence ([place]) [F10.20] 04/03/2015  . Acute ischemic stroke ([place]) [I63.9] 12/08/2014  . Left-sided weakness [M62.89] 12/08/2014  . 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 Knee pain [M25.569] 10/17/2010  . MAMMOGRAM, ABNORMAL, RIGHT [R92.8] 08/05/2010  . Dyslipidemia [E78.5] 06/21/2010  . AMENORRHEA, SECONDARY [N91.2] 10/29/2009  . 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 GERD [K21.9] 10/11/2007  . Essential hypertension [I10] 02/26/2007   Total Time spent with patient: 25 minutes  Past Psychiatric History: as per H&P.  Past Medical History:  Past Medical History:  Diagnosis Date  . Anxiety   . Arthritis    back and knees  . Asthma    daily and prn inhalers  . 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 Bipolar 1  disorder ([place])   . CHF (congestive heart failure) ([place])   . Depression   . Diabetes mellitus    diet-controlled  . Gastric ulcer   . GERD (gastroesophageal reflux disease)   . Gout   . Headache(784.0)    migraines  . High cholesterol   . Hypertension    under control, has been on med. x 12 yrs.  . Substance abuse    crack cocaine  . TMJ (temporomandibular joint disorder)     Past Surgical History:  Procedure Laterality Date  . BREAST LUMPECTOMY WITH NEEDLE LOCALIZATION  04/19/2012   Procedure: BREAST LUMPECTOMY WITH NEEDLE LOCALIZATION;  Surgeon: [name], MD;  Location: [place];  Service: General;  Laterality: Right;  . KNEE ARTHROSCOPY W/ PARTIAL MEDIAL MENISCECTOMY  05/01/2010   right   Family History:  Family History  Problem Relation Age of Onset  . Diabetes Mother   . Breast cancer Mother   . Cancer Father   . Bipolar disorder Maternal Aunt   . Schizophrenia Maternal Grandfather   . Alcoholism Maternal Uncle    Family Psychiatric  History: See Above Social History: As per H&P. History  Alcohol Use  . 0.0 oz/week    Comment: reports she drinks 3 40 ounce beers daily     History  Drug Use  . Types: Cocaine    Comment: 09/28/2015    Social History   Social History  . Marital status: Single    Spouse name: N/A  . Number of children: N/A  . Years  of education: N/A   Social History Main Topics  . Smoking status: Current Every Day Smoker    Packs/day: 1.00    Years: 0.00    Types: Cigarettes  . Smokeless tobacco: Never Used  . Alcohol use 0.0 oz/week     Comment: reports she drinks 3 40 ounce beers daily  . Drug use:     Types: Cocaine     Comment: 09/28/2015  . Sexual activity: No   Other Topics Concern  . None   Social History Narrative  . None   Additional Social History:                         Sleep: Fair   Appetite:  Fair  Current Medications: Current Facility-Administered Medications  Medication  Dose Route Frequency Provider Last Rate Last Dose  . acetaminophen (TYLENOL) tablet 650 mg  650 mg Oral Q6H PRN [name], MD   650 mg at 02/17/16 1617  . alum & mag hydroxide-simeth (MAALOX/MYLANTA) 200-200-20 MG/5ML suspension 30 mL  30 mL Oral Q4H PRN Ramirez Fullbright, MD      . amLODipine (NORVASC) tablet 5 mg  5 mg Oral Daily [name], MD   5 mg at 02/18/16 0941  . [START ON 02/20/2016] ARIPiprazole ER SUSR 400 mg  400 mg Intramuscular Q28 days [name], MD      . atenolol (TENORMIN) tablet 100 mg  100 mg Oral Daily [name], MD   100 mg at 02/18/16 0942  . atorvastatin (LIPITOR) tablet 40 mg  40 mg Oral q1800 [name], MD   40 mg at 02/17/16 1717  . benztropine (COGENTIN) tablet 0.5 mg  0.5 mg Oral QHS [name], MD   0.5 mg at 02/17/16 2116  . divalproex (DEPAKOTE) DR tablet 250 mg  250 mg Oral Q8H Teran Knittle, MD   250 mg at 02/18/16 0635  . gabapentin (NEURONTIN) capsule 400 mg  400 mg Oral TID [name], MD   400 mg at 02/18/16 1149  . insulin aspart (novoLOG) injection 0-15 Units  0-15 Units Subcutaneous TID WC [name], MD   Stopped at 02/17/16 1200  . insulin aspart (novoLOG) injection 0-5 Units  0-5 Units Subcutaneous QHS Abdurrahman Petersheim, MD      . insulin glargine (LANTUS) injection 30 Units  30 Units Subcutaneous QHS [name], MD   30 Units at 02/17/16 2116  . linagliptin (TRADJENTA) tablet 5 mg  5 mg Oral Daily [name], MD   5 mg at 02/18/16 0942  . loratadine (CLARITIN) tablet 10 mg  10 mg Oral Daily [name], MD   10 mg at 02/18/16 0943  . magnesium hydroxide (MILK OF MAGNESIA) suspension 30 mL  30 mL Oral Daily PRN [name], MD      . magnesium oxide (MAG-OX) tablet 400 mg  400 mg Oral Daily Derrill Center, NP   400 mg at 02/18/16 0943  . metFORMIN (GLUCOPHAGE) tablet 1,000 mg  1,000 mg Oral BID WC [name], MD   1,000 mg at 02/18/16 0943  . multivitamin with minerals tablet 1 tablet  1 tablet Oral Daily  [name], MD   1 tablet at 02/18/16 0943  . nicotine (NICODERM CQ - dosed in mg/24 hours) patch 21 mg  21 mg Transdermal Daily [name], MD   21 mg at 02/18/16 0941  . perphenazine (TRILAFON) tablet 2 mg  2 mg Oral BH-qamhs [name], MD  2 mg at 02/18/16 0943  . thiamine (B-1) injection 100 mg  100 mg Intramuscular Once Rakan Soffer, MD      . thiamine (VITAMIN B-1) tablet 100 mg  100 mg Oral Daily [name], MD   100 mg at 02/18/16 0942  . zolpidem (AMBIEN) tablet 5 mg  5 mg Oral QHS PRN [name], MD   5 mg at 02/17/16 2117    Lab Results:  Results for orders placed or performed during the hospital encounter of 02/13/16 (from the past 48 hour(s))  Glucose, capillary     Status: Abnormal   Collection Time: 02/16/16  4:39 PM  Result Value Ref Range   Glucose-Capillary 108 (H) 65 - 99 mg/dL   Comment 1 Notify RN    Comment 2 Document in Chart   Glucose, capillary     Status: Abnormal   Collection Time: 02/16/16  8:14 PM  Result Value Ref Range   Glucose-Capillary 102 (H) 65 - 99 mg/dL  Glucose, capillary     Status: None   Collection Time: 02/17/16  6:17 AM  Result Value Ref Range   Glucose-Capillary 94 65 - 99 mg/dL  Glucose, capillary     Status: None   Collection Time: 02/17/16 12:10 PM  Result Value Ref Range   Glucose-Capillary 73 65 - 99 mg/dL  Glucose, capillary     Status: Abnormal   Collection Time: 02/17/16  5:22 PM  Result Value Ref Range   Glucose-Capillary 114 (H) 65 - 99 mg/dL  Glucose, capillary     Status: Abnormal   Collection Time: 02/17/16  8:34 PM  Result Value Ref Range   Glucose-Capillary 126 (H) 65 - 99 mg/dL  Glucose, capillary     Status: Abnormal   Collection Time: 02/18/16  6:05 AM  Result Value Ref Range   Glucose-Capillary 112 (H) 65 - 99 mg/dL  Glucose, capillary     Status: Abnormal   Collection Time: 02/18/16 11:47 AM  Result Value Ref Range   Glucose-Capillary 119 (H) 65 - 99 mg/dL    Blood Alcohol level:   Lab Results  Component Value Date   ETH 6 (H) 02/13/2016   ETH <5 11/18/2015    Physical Findings: AIMS: Facial and Oral Movements Muscles of Facial Expression: None, normal Lips and Perioral Area: None, normal Jaw: None, normal Tongue: None, normal,Extremity Movements Upper (arms, wrists, hands, fingers): None, normal Lower (legs, knees, ankles, toes): None, normal, Trunk Movements Neck, shoulders, hips: None, normal, Overall Severity Severity of abnormal movements (highest score from questions above): None, normal Incapacitation due to abnormal movements: None, normal Patient's awareness of abnormal movements (rate only patient's report): No Awareness, Dental Status Current problems with teeth and/or dentures?: No Does patient usually wear dentures?: No  CIWA:  CIWA-Ar Total: 1 COWS:     Musculoskeletal: Strength & Muscle Tone: within normal limits Gait & Station: normal Patient leans: N/A  Psychiatric Specialty Exam: Review of Systems  Musculoskeletal: Positive for myalgias.  Psychiatric/Behavioral: Positive for depression, hallucinations and substance abuse. The patient is nervous/anxious.   All other systems reviewed and are negative.   Blood pressure 134/88, pulse 88, temperature 99.3 F (37.4 C), temperature source Oral, resp. rate 16, height 5\' 3"  (1.6 m), weight 95.7 kg (211 lb).Body mass index is 37.38 kg/m.  General Appearance: Casual  Eye Contact::  Fair  Speech:  Clear and Coherent  Volume:  Normal  Mood:  Anxious, Depressed and Dysphoric improving  Affect:  Depressed  Thought Process:  Goal Directed and Descriptions of Associations: Circumstantial  Orientation:  Full (Time, Place, and Person)  Thought Content:  Hallucinations: Auditory on and off - command   Suicidal Thoughts:  No  But has command AH asking her to kill self.  Homicidal Thoughts:  No  Memory:  Immediate;   Fair Recent;   Fair Remote;   Fair  Judgement:  Fair  Insight:  Shallow   Psychomotor Activity:  Restlessness  Concentration:  Fair  Recall:  [employer] of Knowledge:Fair  Language: Fair  Akathisia:  No  Handed:  Right  AIMS (if indicated):     Assets:  Communication Skills Desire for Improvement Resilience  ADL's:  Intact  Cognition: WNL  Sleep:  Number of Hours: 6.25     Treatment Plan Summary:Maureen Swanson is a  50 y.o.AA female , who has a hx of schizoaffective do , cocaine, alcohol, cannabis abuse, who voluntarily presents to [facility] s/p suicide attempt , continues to be depressed, although progressing .  Will continue treatment.  Daily contact with patient to assess and evaluate symptoms and progress in treatment and Medication management  Will continue CIWA/Librium protocol for alcohol withdrawal sx. Will continue Abilify Maintena IM 400 mg - last dose per pt was on august 10 th - repeat q28 days- next IM scheduled on 02/20/16. Will continue Gabapentin 400 mg po tid for mood lability/anxiety/pain. Will continue Ambien 10 mg po qhs prn for sleep. Will continue prozac 40 mg po daily for affective sx. Will continue Depakote dr 250 mg po q8h for mood sx- depakote level on 02/19/16. Will continue Trilafon 2 mg po bid for psychosis - to augment the abilify maintena IM . Will continue Mg oxide 400 mg po daily for hypomagnesemia - started on the weekend - Nxt Mg level on 02/19/16. Restarted home medications as needed. Will continue to monitor vitals ,medication compliance and treatment side effects while patient is here.  Will monitor for medical issues as well as call consult as needed.  Reviewed labs cbc - hb - low , cmp- creatinine slightly elevated, TSH - wnl ( 08/10/15) , lipid panel - wnl ( 08/10/15) , hba1c-5.1 ( 11/20/15) ,uds - pos for stimulants , cocaine , BAL - 6, EKG for qtc monitoring - wnl ( 02/14/16) . CSW will continue  working on disposition. Patient referred to a substance abuse treatment program- pending placement. Patient to participate in  therapeutic milieu     Jaylyne Breese, MD 02/18/2016, 12:52 PM

## 2016-02-18 NOTE — Progress Notes (Signed)
Recreation Therapy Notes  Date: 02/18/16 Time: 1000 Location: 500 Hall Dayroom  Group Topic: Wellness  Goal Area(s) Addresses:  Patient will define components of whole wellness. Patient will verbalize benefit of whole wellness.  Behavioral Response: Engaged  Intervention:  [place], chairs  Activity: Keep it Chartered certified accountant.  LRT arranged the chairs in a circle.  Patients were to sit around the circle and pass the ball back and forth as the LRT kept count of the number of times the ball was hit.  The ball could bounce off of the floor but it could not roll to a stop.  If the ball came to a stop, the count would start over.  Education: Wellness, Dentist.   Education Outcome: Acknowledges education/In group clarification offered/Needs additional education.   Clinical Observations/Feedback: Pt was flat but active.  Pt interacted well with her peers and seemed to enjoy the activity.   Victorino Sparrow, LRT/CTRS    Victorino Sparrow A 02/18/2016 11:17 AM

## 2016-02-18 NOTE — Plan of Care (Signed)
Problem: Education: Goal: Knowledge of the prescribed therapeutic regimen will improve Outcome: Progressing Patient has taken all of her medications as prescribed this shift.

## 2016-02-19 LAB — GLUCOSE, CAPILLARY
GLUCOSE-CAPILLARY: 119 mg/dL — AB (ref 65–99)
GLUCOSE-CAPILLARY: 99 mg/dL (ref 65–99)
Glucose-Capillary: 73 mg/dL (ref 65–99)
Glucose-Capillary: 111 mg/dL — ABNORMAL HIGH (ref 65–99)

## 2016-02-19 LAB — MAGNESIUM: Magnesium: 1.6 mg/dL — ABNORMAL LOW (ref 1.7–2.4)

## 2016-02-19 LAB — VALPROIC ACID LEVEL: VALPROIC ACID LVL: 60 ug/mL (ref 50.0–100.0)

## 2016-02-19 MED ORDER — HYDROXYZINE HCL 25 MG PO TABS
25.0000 mg | ORAL_TABLET | Freq: Three times a day (TID) | ORAL | Status: DC | PRN
  Administered 2016-02-20 – 2016-02-22 (×3): 25 mg via ORAL
  Filled 2016-02-19 (×3): qty 1
  Filled 2016-02-19: qty 10

## 2016-02-19 MED ORDER — MAGNESIUM OXIDE 400 (241.3 MG) MG PO TABS
400.0000 mg | ORAL_TABLET | Freq: Three times a day (TID) | ORAL | Status: DC
  Administered 2016-02-20 – 2016-02-21 (×5): 400 mg via ORAL
  Filled 2016-02-19 (×10): qty 1

## 2016-02-19 MED ORDER — LOPERAMIDE HCL 2 MG PO CAPS
2.0000 mg | ORAL_CAPSULE | ORAL | Status: DC | PRN
  Administered 2016-02-19: 2 mg via ORAL
  Filled 2016-02-19: qty 1

## 2016-02-19 NOTE — Progress Notes (Signed)
[facility] MD Progress Note  02/19/2016 1:51 PM Maureen Swanson  MRN:  [id number] Subjective:  Patient reports "I have diarrhea - several times this AM, I am not sure what is causing it.I am still suicidal , I have a plan to get a knife and do it.'     Objective: Maureen Swanson is a  50 y.o. AA female , who has a hx of schizoaffective do , cocaine, alcohol, cannabis abuse, who voluntarily presents to [facility] S/P suicide attempt by OD and command AH. Maureen Swanson is well known to the [facility] unit .  Maclynn today continues to have anxiety and depression and appears withdrawn. Pt continues to have SI - reports plan to use a knife. Sukhleen reports AH on and off - asking her to kill self. Kamyri continues to need encouragement and support.   Per staff -Maureen Swanson has been complaining of diarrhea - unknown what could be causing it - advised to monitor self- and hold depakote if she continues to have it.       Principal Problem: Schizoaffective disorder, bipolar type (Maureen Swanson) Diagnosis:   Patient Active Problem List   Diagnosis Date Noted  . Cannabis use disorder, moderate, dependence (Maureen Swanson) [F12.20] 11/20/2015  . Homicidal ideation [R45.850]   . Tobacco use disorder [F17.200] 07/20/2015  . Diabetes mellitus (Maureen Swanson) [E11.9] 07/20/2015  . Cocaine use disorder, moderate, dependence (Maureen Swanson) [F14.20] 04/03/2015  . Schizoaffective disorder, bipolar type (Maureen Swanson) [F25.0] 04/03/2015  . Alcohol use disorder, moderate, dependence (Maureen Swanson) [F10.20] 04/03/2015  . Acute ischemic stroke (Maureen Swanson) [I63.9] 12/08/2014  . Left-sided weakness [M62.89] 12/08/2014  . 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 Knee pain [M25.569] 10/17/2010  . MAMMOGRAM, ABNORMAL, RIGHT [R92.8] 08/05/2010  . Dyslipidemia [E78.5] 06/21/2010  . AMENORRHEA, SECONDARY [N91.2] 10/29/2009  . 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 GERD [K21.9] 10/11/2007  . Essential hypertension [I10] 02/26/2007   Total Time spent with patient: 25 minutes  Past Psychiatric  History: as per H&P.  Past Medical History:  Past Medical History:  Diagnosis Date  . Anxiety   . Arthritis    back and knees  . Asthma    daily and prn inhalers  . 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 Bipolar 1 disorder (Maureen Swanson)   . CHF (congestive heart failure) (Maureen Swanson)   . Depression   . Diabetes mellitus    diet-controlled  . Gastric ulcer   . GERD (gastroesophageal reflux disease)   . Gout   . Headache(784.0)    migraines  . High cholesterol   . Hypertension    under control, has been on med. x 12 yrs.  . Substance abuse    crack cocaine  . TMJ (temporomandibular joint disorder)     Past Surgical History:  Procedure Laterality Date  . BREAST LUMPECTOMY WITH NEEDLE LOCALIZATION  04/19/2012   Procedure: BREAST LUMPECTOMY WITH NEEDLE LOCALIZATION;  Surgeon: [name], MD;  Location: [name];  Service: General;  Laterality: Right;  . KNEE ARTHROSCOPY W/ PARTIAL MEDIAL MENISCECTOMY  05/01/2010   right   Family History:  Family History  Problem Relation Age of Onset  . Diabetes Mother   . Breast cancer Mother   . Cancer Father   . Bipolar disorder Maternal Aunt   . Schizophrenia Maternal Grandfather   . Alcoholism Maternal Uncle    Family Psychiatric  History: See Above Social History: As per H&P. History  Alcohol Use  . 0.0 oz/week  Comment: reports she drinks 3 40 ounce beers daily     History  Drug Use  . Types: Cocaine    Comment: 09/28/2015    Social History   Social History  . Marital status: Single    Spouse name: N/A  . Number of children: N/A  . Years of education: N/A   Social History Main Topics  . Smoking status: Current Every Day Smoker    Packs/day: 1.00    Years: 0.00    Types: Cigarettes  . Smokeless tobacco: Never Used  . Alcohol use 0.0 oz/week     Comment: reports she drinks 3 40 ounce beers daily  . Drug use:     Types: Cocaine     Comment: 09/28/2015  . Sexual activity: No   Other  Topics Concern  . None   Social History Narrative  . None   Additional Social History:                         Sleep: Poor   Appetite:  Fair  Current Medications: Current Facility-Administered Medications  Medication Dose Route Frequency Provider Last Rate Last Dose  . acetaminophen (TYLENOL) tablet 650 mg  650 mg Oral Q6H PRN [name], MD   650 mg at 02/19/16 0838  . alum & mag hydroxide-simeth (MAALOX/MYLANTA) 200-200-20 MG/5ML suspension 30 mL  30 mL Oral Q4H PRN [name], MD      . amLODipine (NORVASC) tablet 5 mg  5 mg Oral Daily [name], MD   5 mg at 02/19/16 0834  . [START ON 02/20/2016] ARIPiprazole ER SUSR 400 mg  400 mg Intramuscular Q28 days [name], MD      . atenolol (TENORMIN) tablet 100 mg  100 mg Oral Daily [name], MD   100 mg at 02/19/16 0832  . atorvastatin (LIPITOR) tablet 40 mg  40 mg Oral q1800 [name], MD   40 mg at 02/18/16 1719  . benztropine (COGENTIN) tablet 0.5 mg  0.5 mg Oral QHS [name], MD   0.5 mg at 02/18/16 2116  . divalproex (DEPAKOTE) DR tablet 250 mg  250 mg Oral Q8H Vandy Fong, MD   250 mg at 02/19/16 1317  . gabapentin (NEURONTIN) capsule 400 mg  400 mg Oral TID [name], MD   400 mg at 02/19/16 1207  . hydrOXYzine (ATARAX/VISTARIL) tablet 50 mg  50 mg Oral QHS,MR X 1 [name], PA-C   50 mg at 02/18/16 2306  . insulin aspart (novoLOG) injection 0-15 Units  0-15 Units Subcutaneous TID WC [name], MD   0 Units at 02/19/16 1208  . insulin aspart (novoLOG) injection 0-5 Units  0-5 Units Subcutaneous QHS [name], MD      . insulin glargine (LANTUS) injection 30 Units  30 Units Subcutaneous QHS [name], MD   30 Units at 02/18/16 2116  . linagliptin (TRADJENTA) tablet 5 mg  5 mg Oral Daily [name], MD   5 mg at 02/19/16 0831  . loperamide (IMODIUM) capsule 2 mg  2 mg Oral PRN [name], MD   2 mg at 02/19/16 1316  . loratadine (CLARITIN) tablet 10  mg  10 mg Oral Daily [name], MD   10 mg at 02/19/16 0832  . magnesium hydroxide (MILK OF MAGNESIA) suspension 30 mL  30 mL Oral Daily PRN Melika Reder, MD      . magnesium oxide (MAG-OX) tablet 400 mg  400 mg  Oral Daily Derrill Center, NP   400 mg at 02/19/16 0831  . metFORMIN (GLUCOPHAGE) tablet 1,000 mg  1,000 mg Oral BID WC [name], MD   1,000 mg at 02/19/16 0832  . multivitamin with minerals tablet 1 tablet  1 tablet Oral Daily [name], MD   1 tablet at 02/19/16 0831  . nicotine (NICODERM CQ - dosed in mg/24 hours) patch 21 mg  21 mg Transdermal Daily [name], MD   21 mg at 02/19/16 0836  . perphenazine (TRILAFON) tablet 2 mg  2 mg Oral BH-qamhs [name], MD   2 mg at 02/19/16 0833  . thiamine (B-1) injection 100 mg  100 mg Intramuscular Once Trulee Hamstra, MD      . thiamine (VITAMIN B-1) tablet 100 mg  100 mg Oral Daily Paidyn Mcferran, MD   100 mg at 02/19/16 0832  . zolpidem (AMBIEN) tablet 5 mg  5 mg Oral QHS PRN [name], MD   5 mg at 02/18/16 2116    Lab Results:  Results for orders placed or performed during the hospital encounter of 02/13/16 (from the past 48 hour(s))  Glucose, capillary     Status: Abnormal   Collection Time: 02/17/16  5:22 PM  Result Value Ref Range   Glucose-Capillary 114 (H) 65 - 99 mg/dL  Glucose, capillary     Status: Abnormal   Collection Time: 02/17/16  8:34 PM  Result Value Ref Range   Glucose-Capillary 126 (H) 65 - 99 mg/dL  Glucose, capillary     Status: Abnormal   Collection Time: 02/18/16  6:05 AM  Result Value Ref Range   Glucose-Capillary 112 (H) 65 - 99 mg/dL  Glucose, capillary     Status: Abnormal   Collection Time: 02/18/16 11:47 AM  Result Value Ref Range   Glucose-Capillary 119 (H) 65 - 99 mg/dL  Glucose, capillary     Status: Abnormal   Collection Time: 02/18/16  4:56 PM  Result Value Ref Range   Glucose-Capillary 119 (H) 65 - 99 mg/dL   Comment 1 Notify RN    Comment 2 Document in Chart    Glucose, capillary     Status: Abnormal   Collection Time: 02/18/16  9:07 PM  Result Value Ref Range   Glucose-Capillary 119 (H) 65 - 99 mg/dL   Comment 1 Notify RN   Valproic acid level     Status: None   Collection Time: 02/19/16  6:22 AM  Result Value Ref Range   Valproic Acid Lvl 60 50.0 - 100.0 ug/mL    Comment: Performed at [facility]  Magnesium     Status: Abnormal   Collection Time: 02/19/16  6:22 AM  Result Value Ref Range   Magnesium 1.6 (L) 1.7 - 2.4 mg/dL    Comment: Performed at [facility]  Glucose, capillary     Status: None   Collection Time: 02/19/16  6:37 AM  Result Value Ref Range   Glucose-Capillary 73 65 - 99 mg/dL  Glucose, capillary     Status: Abnormal   Collection Time: 02/19/16 12:02 PM  Result Value Ref Range   Glucose-Capillary 119 (H) 65 - 99 mg/dL   Comment 1 Notify RN    Comment 2 Document in Chart     Blood Alcohol level:  Lab Results  Component Value Date   ETH 6 (H) 02/13/2016   ETH <5 11/18/2015    Physical Findings: AIMS: Facial and Oral Movements Muscles of Facial  Expression: None, normal Lips and Perioral Area: None, normal Jaw: None, normal Tongue: None, normal,Extremity Movements Upper (arms, wrists, hands, fingers): None, normal Lower (legs, knees, ankles, toes): None, normal, Trunk Movements Neck, shoulders, hips: None, normal, Overall Severity Severity of abnormal movements (highest score from questions above): None, normal Incapacitation due to abnormal movements: None, normal Patient's awareness of abnormal movements (rate only patient's report): No Awareness, Dental Status Current problems with teeth and/or dentures?: No Does patient usually wear dentures?: No  CIWA:  CIWA-Ar Total: 1 COWS:     Musculoskeletal: Strength & Muscle Tone: within normal limits Gait & Station: normal Patient leans: N/A  Psychiatric Specialty Exam: Review of Systems  Gastrointestinal: Positive for  diarrhea.  Musculoskeletal: Positive for myalgias.  Psychiatric/Behavioral: Positive for depression, hallucinations and substance abuse. The patient is nervous/anxious.   All other systems reviewed and are negative.   Blood pressure (!) 144/102, pulse 81, temperature 98.9 F (37.2 C), temperature source Oral, resp. rate 20, height 5\' 3"  (1.6 m), weight 95.7 kg (211 lb).Body mass index is 37.38 kg/m.  General Appearance: Casual  Eye Contact::  Fair  Speech:  Clear and Coherent  Volume:  Normal  Mood:  Anxious, Depressed and Dysphoric   Affect:  Depressed  Thought Process:  Goal Directed and Descriptions of Associations: Circumstantial  Orientation:  Full (Time, Place, and Person)  Thought Content:  Hallucinations: Auditory on and off - command   Suicidal Thoughts:  Yes.  with intent/plan  To use a knife, has command AH asking her to kill self.  Homicidal Thoughts:  No  Memory:  Immediate;   Fair Recent;   Fair Remote;   Fair  Judgement:  Fair  Insight:  Shallow  Psychomotor Activity:  Restlessness  Concentration:  Fair  Recall:  [employer] of Knowledge:Fair  Language: Fair  Akathisia:  No  Handed:  Right  AIMS (if indicated):     Assets:  Communication Skills Desire for Improvement Resilience  ADL's:  Intact  Cognition: WNL  Sleep:  Number of Hours: 6.25     Treatment Plan Summary:Myrl Jerilynn Mages Frisinger is a  50 y.o.AA female , who has a hx of schizoaffective do , cocaine, alcohol, cannabis abuse, who voluntarily presents to [facility] s/p suicide attempt , continues to be depressed, although progressing .  Will continue treatment.  Daily contact with patient to assess and evaluate symptoms and progress in treatment and Medication management  Will continue CIWA/Librium protocol for alcohol withdrawal sx. Will continue Abilify Maintena IM 400 mg - last dose per pt was on august 10 th - repeat q28 days- next IM scheduled on 02/20/16. Will continue Gabapentin 400 mg po tid for mood  lability/anxiety/pain. Will continue Ambien 10 mg po qhs prn for sleep. Will continue prozac 40 mg po daily for affective sx.( will hold if she continues to have diarrhea)  Will continue Depakote dr 250 mg po q8h for mood sx- depakote level on 02/19/16- 60 ug/ml ( therapeutic) - will hold depakote if she continues to have diarrhea. Will continue Trilafon 2 mg po bid for psychosis - to augment the abilify maintena IM . Will continue Mg oxide 400 mg po daily for hypomagnesemia - started on the weekend - Repeat Mg level on 02/19/16 today - 1.6 - will consult hospitalist for recommendations.Freda Munro Samuel Jester NP -working on it. Restarted home medications as needed. Will continue to monitor vitals ,medication compliance and treatment side effects while patient is here.  Will monitor for medical issues  as well as call consult as needed.  CSW will continue  working on disposition. Patient referred to a substance abuse treatment program- pending placement. Patient to participate in therapeutic milieu     Ervine Witucki, MD 02/19/2016, 1:51 PM

## 2016-02-19 NOTE — Progress Notes (Signed)
Recreation Therapy Notes    Date: 02/19/16 Time: 1000 Location: 500 Hall Dayroom  Group Topic: Anger Management  Goal Area(s) Addresses:  Patient will identify triggers for anger.  Patient will identify physical reaction to anger.   Patient will identify benefit of using coping skills when angry.  Behavioral Response:  Attentive  Intervention: Worksheet, colored pencils  Activity: Anger thermometer.  Patients were to rank the things that make them angry on a scale from 1 to 10, with 10 being the angriest you get.  Patients were to also give a description of that experience.  Patients were to then come up with coping skills to help them deal with their anger.    Education: Anger Management, Discharge Planning   Education Outcome: Acknowledges education/In group clarification offered/Needs additional education.   Clinical Observations/Feedback: Pt was flat.  Pt participated but seemed a little distracted.  Pt ranked "fussing" as a 10 and loud noise as a 7.  Pt stated some of her coping skills as music, going for a walk and getting to herself.  Victorino Sparrow, LRT/CTRS     Victorino Sparrow A 02/19/2016 11:43 AM

## 2016-02-19 NOTE — Consult Note (Signed)
Medical Consultation   Maureen Swanson  [id number]  DOB: 08-Feb-1966  DOA: 02/13/2016  PCP: Elbert Ewings, FNP   Outpatient Specialists:     Requesting physician: Psychiatry   [place] for consultation: Hypomagnesemia   History of Present Illness: Maureen Swanson is an 50 y.o. female admitted to the psychiatric ward, she has been suffering from diarrhea for last 3 days, it is a watery, moderate to severe intensity, about 4 times per day, associated with abdominal pain, nausea but no vomiting. There is no improving or worsening factors. She denies any chronic diarrhea. No new medications or sick contacts      Review of Systems:  ROS As per HPI otherwise 10 point review of systems negative.     Past Medical History: Past Medical History:  Diagnosis Date  . Anxiety   . Arthritis    back and knees  . Asthma    daily and prn inhalers  . 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 Bipolar 1 disorder ([place])   . CHF (congestive heart failure) ([place])   . Depression   . Diabetes mellitus    diet-controlled  . Gastric ulcer   . GERD (gastroesophageal reflux disease)   . Gout   . Headache(784.0)    migraines  . High cholesterol   . Hypertension    under control, has been on med. x 12 yrs.  . Substance abuse    crack cocaine  . TMJ (temporomandibular joint disorder)     Past Surgical History: Past Surgical History:  Procedure Laterality Date  . BREAST LUMPECTOMY WITH NEEDLE LOCALIZATION  04/19/2012   Procedure: BREAST LUMPECTOMY WITH NEEDLE LOCALIZATION;  Surgeon: [name], MD;  Location: [place];  Service: General;  Laterality: Right;  . KNEE ARTHROSCOPY W/ PARTIAL MEDIAL MENISCECTOMY  05/01/2010   right     Allergies:   Allergies  Allergen Reactions  . Chocolate Hives  . Orange Hives    "Acid foods"  . Penicillins Hives    Has patient had a PCN reaction causing immediate rash, facial/tongue/throat  swelling, SOB or lightheadedness with hypotension: Yes Has patient had a PCN reaction causing severe rash involving mucus membranes or skin necrosis: Yes Has patient had a PCN reaction that required hospitalization No Has patient had a PCN reaction occurring within the last 10 years: No If all of the above answers are "NO", then may proceed with Cephalosporin use.   . Other Swelling    strawberries  . Tomato Hives    "acid foods"     Social History:  reports that she has been smoking Cigarettes.  She has been smoking about 1.00 pack per day for the past 0.00 years. She has never used smokeless tobacco. She reports that she drinks alcohol. She reports that she uses drugs, including Cocaine.   Family History: Family History  Problem Relation Age of Onset  . Diabetes Mother   . Breast cancer Mother   . Cancer Father   . Bipolar disorder Maternal Aunt   . Schizophrenia Maternal Grandfather   . Alcoholism Maternal Uncle     Physical Exam: Vitals:   02/18/16 0624 02/18/16 0625 02/19/16 0700 02/19/16 0701  BP: 129/85 134/88 (!) 137/97 (!) 144/102  Pulse: 75 88 68 81  Resp: 16  20   Temp: 99.3 F (37.4 C)  98.9 F (37.2 C)   TempSrc:  Oral  Oral   Weight:      Height:        Constitutional: Alert and awake, oriented x3, not in any acute distress. Eyes: PERLA, EOMI, irises appear normal, anicteric sclera,  ENMT: external ears and nose appear normal, Lips appears normal, oropharynx mucosa, tongue, posterior pharynx appear normal  Neck: neck appears normal, no masses, normal ROM, no thyromegaly, no JVD  CVS: S1-S2 clear, no murmur rubs or gallops, no LE edema, normal pedal pulses  Respiratory:  clear to auscultation bilaterally, no wheezing, rales or rhonchi. Respiratory effort normal. No accessory muscle use.  Abdomen: soft nontender, nondistended, normal bowel sounds, no hepatosplenomegaly, no hernias  Musculoskeletal: : no cyanosis, clubbing or edema noted bilaterally Neuro:  Cranial nerves II-XII intact, strength, sensation, reflexes Skin: no rashes or lesions or ulcers, no induration or nodules    Data reviewed:  I have personally reviewed following labs and imaging studies Labs:  CBC:  Recent Labs Lab 02/13/16 0410 02/13/16 0459  WBC 10.2  --   HGB 11.4* 12.9  HCT 35.2* 38.0  MCV 88.7  --   PLT 221  --     Basic Metabolic Panel:  Recent Labs Lab 02/13/16 0410 02/13/16 0459 02/15/16 1821 02/19/16 0622  NA 139 139  --   --   K 3.9 3.9  --   --   CL 105 103  --   --   CO2 25  --   --   --   GLUCOSE 78 78  --   --   BUN 20 20  --   --   CREATININE 1.09* 1.10*  --   --   CALCIUM 9.9  --   --   --   MG 1.6*  --  1.4* 1.6*   GFR Estimated Creatinine Clearance: 67.3 mL/min (by C-G formula based on SCr of 1.1 mg/dL). Liver Function Tests:  Recent Labs Lab 02/13/16 0410  AST 39  ALT 18  ALKPHOS 62  BILITOT 1.3*  PROT 8.0  ALBUMIN 4.3   No results for input(s): LIPASE, AMYLASE in the last 168 hours. No results for input(s): AMMONIA in the last 168 hours. Coagulation profile No results for input(s): INR, PROTIME in the last 168 hours.  Cardiac Enzymes: No results for input(s): CKTOTAL, CKMB, CKMBINDEX, TROPONINI in the last 168 hours. BNP: Invalid input(s): POCBNP CBG:  Recent Labs Lab 02/18/16 1656 02/18/16 2107 02/19/16 0637 02/19/16 1202 02/19/16 1703  GLUCAP 119* 119* 73 119* 99   D-Dimer No results for input(s): DDIMER in the last 72 hours. Hgb A1c No results for input(s): HGBA1C in the last 72 hours. Lipid Profile No results for input(s): CHOL, HDL, LDLCALC, TRIG, CHOLHDL, LDLDIRECT in the last 72 hours. Thyroid function studies No results for input(s): TSH, T4TOTAL, T3FREE, THYROIDAB in the last 72 hours.  Invalid input(s): FREET3 Anemia work up No results for input(s): VITAMINB12, FOLATE, FERRITIN, TIBC, IRON, RETICCTPCT in the last 72 hours. Urinalysis    Component Value Date/Time   COLORURINE COLORLESS  (A) 08/10/2015 1425   APPEARANCEUR CLEAR (A) 08/10/2015 1425   LABSPEC 1.005 08/10/2015 1425   PHURINE 5.0 08/10/2015 1425   GLUCOSEU NEGATIVE 08/10/2015 1425   HGBUR NEGATIVE 08/10/2015 1425   HGBUR negative 04/08/2010 1006   BILIRUBINUR NEGATIVE 08/10/2015 1425   KETONESUR NEGATIVE 08/10/2015 1425   PROTEINUR NEGATIVE 08/10/2015 1425   UROBILINOGEN 0.2 04/04/2015 0611   NITRITE NEGATIVE 08/10/2015 1425   LEUKOCYTESUR 2+ (A) 08/10/2015 1425  Microbiology No results found for this or any previous visit (from the past 240 hour(s)).     Inpatient Medications:   Scheduled Meds: . amLODipine  5 mg Oral Daily  . [START ON 02/20/2016] ARIPiprazole ER  400 mg Intramuscular Q28 days  . atenolol  100 mg Oral Daily  . atorvastatin  40 mg Oral q1800  . benztropine  0.5 mg Oral QHS  . divalproex  250 mg Oral Q8H  . gabapentin  400 mg Oral TID  . hydrOXYzine  50 mg Oral QHS,MR X 1  . insulin aspart  0-15 Units Subcutaneous TID WC  . insulin aspart  0-5 Units Subcutaneous QHS  . insulin glargine  30 Units Subcutaneous QHS  . linagliptin  5 mg Oral Daily  . loratadine  10 mg Oral Daily  . magnesium oxide  400 mg Oral Daily  . metFORMIN  1,000 mg Oral BID WC  . multivitamin with minerals  1 tablet Oral Daily  . nicotine  21 mg Transdermal Daily  . perphenazine  2 mg Oral BH-qamhs  . thiamine  100 mg Intramuscular Once  . thiamine  100 mg Oral Daily   Continuous Infusions:    Radiological Exams on Admission: No results found.  Impression/Recommendations Principal Problem:   Schizoaffective disorder, bipolar type ([place]) Active Problems:   Essential hypertension   Cocaine use disorder, moderate, dependence (H[facility])   Alcohol use disorder, moderate, dependence (HCC)   Tobacco use disorder   Diabetes mellitus ([place])   Cannabis use disorder, moderate, dependence ([place])  This is a 50 year old female admitted to the psychiatric ward, has developed diarrhea, and physical examination  she looks well hydrated, she has been afebrile, temperature 98.9, blood pressure 07/16/1975, heart rate 68, respiratory 20. Her oral mucosa is moist, her lungs are clear to auscultation, heart S1-S2 present rhythmic, abdomen soft nontender, lower extremities no edema. Her magnesium has been 1.4 and 1.6 she is on magnesium oxide 400 minutes daily.  Working diagnosis hypomagnesemia due to diarrhea.  1. Hypomagnesemia. Will increase magnesium oxide supplements to 3 times daily, follow-up kidney function morning, patient looks well hydrated, she was encourage continue by mouth hydration.  2. Diabetes mellitus continue insulin regimen and metformin. Serum glucose 73, 119, 99.  3. Hypertension. Continue atenolol and amlodipine.  4. Diarrhea. We'll continue hydration by mouth. Likely to be viral in origin. Will recommend to stop Lomotil for now.    Thank you for this consultation.  Our [facility] hospitalist team will follow the patient with you.     Mauricio Gerome Apley M.D. [facility] 02/19/2016, 8:11 PM

## 2016-02-19 NOTE — Progress Notes (Signed)
creation Therapy Notes  Animal-Assisted Activity (AAA) Program Checklist/Progress Notes Patient Eligibility Criteria Checklist & Daily Group note for Rec Tx Intervention  Date: 09.05.2017 Time: 2:45pm Location: [address]    AAA/T Program Assumption of Risk Form signed by Patient/ or Parent Legal Guardian Yes  Patient is free of allergies or sever asthma Yes  Patient reports no fear of animals Yes  Patient reports no history of cruelty to animals Yes  Patient understands his/her participation is voluntary Yes  Patient washes hands before animal contact Yes  Patient washes hands after animal contact Yes  Behavioral Response: Engaged, Attentive.    Education: Contractor, Pensions consultant   Education Outcome: Acknowledges education.   Clinical Observations/Feedback: Patient discussed with MD for appropriateness in pet therapy session. Both LRT and MD agree patient is appropriate for participation. Patient offered participation in session and signed necessary consent form without issue. Patient attended session, petting therapy dog appropriately. Patient engaged well with peers in session and returned to 500 hall without issue.   Laureen Ochs Garnetta Fedrick, LRT/CTRS  Deniyah Dillavou L 02/19/2016 3:06 PM

## 2016-02-19 NOTE — Progress Notes (Signed)
DAR NOTE: Patient presents with anxious affect and depressed mood.  Denies pain, auditory and visual hallucinations.  Rates depression at 8, hopelessness at 8, and anxiety at 8.  Reports suicidal thoughts but contracts for safety.  Maintained on routine safety checks.  Medications given as prescribed.  Support and encouragement offered as needed.  Attended group and participated.  States goal for today is "to get my directions together and my self esteem."  Patient observed socializing with peers in the dayroom.  Patient ambulatory on the unit with wheel chair self propelling.  No signs and symptoms of hypoglycemia noted.

## 2016-02-19 NOTE — Progress Notes (Signed)
D    Pt is very depressed and sad   She spends a lot of time in bed but did say she made it to a few groups today    She had difficulty falling asleep so she needed extra medication to aid her in falling asleep    A    Verbal support given   Medications administered and effectiveness monitored    Q 15 min checks  R   Pt is safe at present and receptive to verbal support

## 2016-02-19 NOTE — [facility] Group Notes (Signed)
[name] LCSW Group Therapy  02/19/2016 , 2:11 PM   Type of Therapy:  Group Therapy  Participation Level:  Active  Participation Quality:  Attentive  Affect:  Appropriate  Cognitive:  Alert  Insight:  Improving  Engagement in Therapy:  Engaged  Modes of Intervention:  Discussion, Exploration and Socialization  Summary of Progress/Problems: Today's group focused on the term Diagnosis.  Participants were asked to define the term, and then pronounce whether it is a negative, positive or neutral term.  Stayed the entire time, unengaged.  Shared at the beginning that she is upset that her belongings at [facility] have been lost, including her ID and SS card.  Unable to focus on anything else.  Maureen Swanson 02/19/2016 , 2:11 PM

## 2016-02-20 DIAGNOSIS — F250 Schizoaffective disorder, bipolar type: Principal | ICD-10-CM

## 2016-02-20 DIAGNOSIS — R45851 Suicidal ideations: Secondary | ICD-10-CM

## 2016-02-20 LAB — GLUCOSE, CAPILLARY
GLUCOSE-CAPILLARY: 65 mg/dL (ref 65–99)
GLUCOSE-CAPILLARY: 112 mg/dL — AB (ref 65–99)
GLUCOSE-CAPILLARY: 132 mg/dL — AB (ref 65–99)
Glucose-Capillary: 111 mg/dL — ABNORMAL HIGH (ref 65–99)
Glucose-Capillary: 142 mg/dL — ABNORMAL HIGH (ref 65–99)

## 2016-02-20 LAB — BASIC METABOLIC PANEL
ANION GAP: 7 (ref 5–15)
BUN: 12 mg/dL (ref 6–20)
CHLORIDE: 108 mmol/L (ref 101–111)
CO2: 27 mmol/L (ref 22–32)
Calcium: 9.3 mg/dL (ref 8.9–10.3)
Creatinine, Ser: 0.96 mg/dL (ref 0.44–1.00)
Glucose, Bld: 141 mg/dL — ABNORMAL HIGH (ref 65–99)
POTASSIUM: 3.6 mmol/L (ref 3.5–5.1)
SODIUM: 142 mmol/L (ref 135–145)

## 2016-02-20 LAB — MAGNESIUM: MAGNESIUM: 1.6 mg/dL — AB (ref 1.7–2.4)

## 2016-02-20 MED ORDER — LOPERAMIDE HCL 2 MG PO CAPS
2.0000 mg | ORAL_CAPSULE | Freq: Once | ORAL | Status: AC
  Administered 2016-02-20: 2 mg via ORAL
  Filled 2016-02-20 (×2): qty 1

## 2016-02-20 MED ORDER — INSULIN GLARGINE 100 UNIT/ML ~~LOC~~ SOLN
25.0000 [IU] | Freq: Every day | SUBCUTANEOUS | Status: DC
  Administered 2016-02-20: 25 [IU] via SUBCUTANEOUS

## 2016-02-20 NOTE — Progress Notes (Signed)
PROGRESS NOTE  Maureen Swanson [id number] DOB: 25-Jun-1965 DOA: 02/13/2016 PCP: Elbert Ewings, FNP   LOS: 7 days   Brief Narrative: Maureen Swanson is an 50 y.o. female admitted to the psychiatric ward, she has been suffering from diarrhea for last 3 days, it is a watery, moderate to severe intensity, about 4 times per day, associated with abdominal pain, nausea but no vomiting. There is no improving or worsening factors. She denies any chronic diarrhea. No new medications or sick contacts  Assessment & Plan: Principal Problem:   Schizoaffective disorder, bipolar type ([place]) Active Problems:   Essential hypertension   Cocaine use disorder, moderate, dependence (HCC)   Alcohol use disorder, moderate, dependence (HCC)   Tobacco use disorder   Diabetes mellitus (HCC)   Cannabis use disorder, moderate, dependence (HCC)   Hypomagnesemia.  - Mg oxide increased yesterday, magnesium did not drop any further, continue - Avoid increasing the dose further to avoid worsening diarrhea   Diarrhea - Likely viral gastritis enteritis, no significant abdominal pain, no nausea or vomiting  - Should be self-limiting, can last up to 7-10 days   Diabetes mellitus  - continue insulin regimen and metformin.   Hypertension.  - Continue atenolol and amlodipine.  Will monitor peripherally magnesium level tomorrow as well.   Procedures:   None   Antimicrobials:  None    Subjective: - ongoing diarrhea, states that she is having for slow stools everyday, does not appreciate any improvement   Objective: Vitals:   02/20/16 0730 02/20/16 0731 02/20/16 0759 02/20/16 1136  BP: 140/83 (!) 133/92 (!) 133/92 139/83  Pulse: 66 73 73 76  Resp: 18     Temp: 98.1 F (36.7 C)     TempSrc: Oral     Weight:      Height:       No intake or output data in the 24 hours ending 02/20/16 1724 Filed Weights   02/13/16 2136  Weight: 95.7 kg (211 lb)    Examination: Constitutional: NAD Vitals:   02/20/16 0730 02/20/16 0731 02/20/16 0759 02/20/16 1136  BP: 140/83 (!) 133/92 (!) 133/92 139/83  Pulse: 66 73 73 76  Resp: 18     Temp: 98.1 F (36.7 C)     TempSrc: Oral     Weight:      Height:       Eyes: PERRL, lids and conjunctivae normal ENMT: Mucous membranes are moist. No oropharyngeal exudates Respiratory: clear to auscultation bilaterally, no wheezing, no crackles. Normal respiratory effort. No accessory muscle use.  Cardiovascular: Regular rate and rhythm, no murmurs / rubs / gallops. No LE edema. 2+ pedal pulses. No carotid bruits.  Abdomen: no tenderness. Bowel sounds positive.  Musculoskeletal: no clubbing / cyanosis   Data Reviewed: I have personally reviewed following labs and imaging studies  CBC: No results for input(s): WBC, NEUTROABS, HGB, HCT, MCV, PLT in the last 168 hours. Basic Metabolic Panel:  Recent Labs Lab 02/15/16 1821 02/19/16 0622 02/20/16 0650  NA  --   --  142  K  --   --  3.6  CL  --   --  108  CO2  --   --  27  GLUCOSE  --   --  141*  BUN  --   --  12  CREATININE  --   --  0.96  CALCIUM  --   --  9.3  MG 1.4* 1.6* 1.6*   GFR: Estimated Creatinine Clearance: 77.1 mL/min (by C-G  formula based on SCr of 0.96 mg/dL). Liver Function Tests: No results for input(s): AST, ALT, ALKPHOS, BILITOT, PROT, ALBUMIN in the last 168 hours. No results for input(s): LIPASE, AMYLASE in the last 168 hours. No results for input(s): AMMONIA in the last 168 hours. Coagulation Profile: No results for input(s): INR, PROTIME in the last 168 hours. Cardiac Enzymes: No results for input(s): CKTOTAL, CKMB, CKMBINDEX, TROPONINI in the last 168 hours. BNP (last 3 results) No results for input(s): PROBNP in the last 8760 hours. HbA1C: No results for input(s): HGBA1C in the last 72 hours. CBG:  Recent Labs Lab 02/19/16 2027 02/20/16 0613 02/20/16 0638 02/20/16 1140 02/20/16 1716  GLUCAP 111* 65 112* 111* 142*   Lipid Profile: No results for  input(s): CHOL, HDL, LDLCALC, TRIG, CHOLHDL, LDLDIRECT in the last 72 hours. Thyroid Function Tests: No results for input(s): TSH, T4TOTAL, FREET4, T3FREE, THYROIDAB in the last 72 hours. Anemia Panel: No results for input(s): VITAMINB12, FOLATE, FERRITIN, TIBC, IRON, RETICCTPCT in the last 72 hours. Urine analysis:    Component Value Date/Time   COLORURINE COLORLESS (A) 08/10/2015 1425   APPEARANCEUR CLEAR (A) 08/10/2015 1425   LABSPEC 1.005 08/10/2015 1425   PHURINE 5.0 08/10/2015 1425   GLUCOSEU NEGATIVE 08/10/2015 1425   HGBUR NEGATIVE 08/10/2015 1425   HGBUR negative 04/08/2010 1006   BILIRUBINUR NEGATIVE 08/10/2015 1425   KETONESUR NEGATIVE 08/10/2015 1425   PROTEINUR NEGATIVE 08/10/2015 1425   UROBILINOGEN 0.2 04/04/2015 0611   NITRITE NEGATIVE 08/10/2015 1425   LEUKOCYTESUR 2+ (A) 08/10/2015 1425   Sepsis Labs: Invalid input(s): PROCALCITONIN, LACTICIDVEN  No results found for this or any previous visit (from the past 240 hour(s)).    Radiology Studies: No results found.   Scheduled Meds: . amLODipine  5 mg Oral Daily  . ARIPiprazole ER  400 mg Intramuscular Q28 days  . atenolol  100 mg Oral Daily  . atorvastatin  40 mg Oral q1800  . benztropine  0.5 mg Oral QHS  . divalproex  250 mg Oral Q8H  . gabapentin  400 mg Oral TID  . hydrOXYzine  50 mg Oral QHS,MR X 1  . insulin aspart  0-15 Units Subcutaneous TID WC  . insulin aspart  0-5 Units Subcutaneous QHS  . insulin glargine  25 Units Subcutaneous QHS  . linagliptin  5 mg Oral Daily  . loratadine  10 mg Oral Daily  . magnesium oxide  400 mg Oral TID WC  . metFORMIN  1,000 mg Oral BID WC  . multivitamin with minerals  1 tablet Oral Daily  . nicotine  21 mg Transdermal Daily  . perphenazine  2 mg Oral BH-qamhs  . thiamine  100 mg Intramuscular Once  . thiamine  100 mg Oral Daily   Continuous Infusions:     Marzetta Board, MD, PhD [facility] Pager [phone]  If 7PM-7AM, please contact  night-coverage [email or web address] Password [facility] 02/20/2016, 5:24 PM

## 2016-02-20 NOTE — Progress Notes (Addendum)
Patient ID: Maureen Swanson, female   DOB: [birth year], 50 y.o.   MRN: [id number]   Pt currently presents with a flat affect and guarded behavior. Pt reports to writer that their goal is to "find my stuff." Pt states "I feel like I'm in limbo right now." Pt reports good sleep with current medication regimen. Pt reports 8/10 back pain. Attends group.  Pt provided with medications per providers orders. Pt's labs and vitals were monitored throughout the night. Pt supported emotionally and encouraged to express concerns and questions. Pt educated on medications.  Pt's safety ensured with 15 minute and environmental checks. Pt currently denies SI/HI and A/V hallucinations. Pt verbally agrees to seek staff if SI/HI or A/VH occurs and to consult with staff before acting on any harmful thoughts. Will continue POC.  *Spoke with the nurse about patients missing personal belongings. RN reports "It was there in the morning and gone when I got back to work. There was something said about it being wet, with food inside and possibly bugs. It could have been put outside to dry and I'm not sure what happened to it." [facility] notified. Will notify pt in the morning and refer to risk management.

## 2016-02-20 NOTE — Progress Notes (Signed)
Patient's CBG WNL. Will continue to monitor.

## 2016-02-20 NOTE — Progress Notes (Signed)
Nursing Note 02/20/2016 [id number]  Data Reports sleeping well.  Did not complete self inventory- reminded to complete this AM.  Rates depression 8/10, hopelessness 8/10, and anxiety 8/10. Affect depressed mood "depressed."  Denies HI.  Reports ongoing chronic SI with no plan- agrees to come to staff before acting on harmful thoughts.  Reports ongoing auditory hallucinations, denies command hallucinations.   Reports ongoing chronic back pain and anxiety.  Back pain relieve when she lays down. Received scheduled Abilify injection this AM- tolerated well. Patient did not attend AM groups but was seen in day area before and after lunch, watching TV and interacting minimally with peers. Stated she would try to go to groups today.    Action Spoke with patient 1:1, nurse offered support to patient throughout shift.  Pain/anxiety managed with PRN's per MAR.  Continues to be monitored on 15 minute checks for safety.  Response Remains safe and appropriate on unit. Attended afternoon groups.

## 2016-02-20 NOTE — Progress Notes (Signed)
Recreation Therapy Notes  Date: 02/20/16 Time: 1000 Location: 500 Hall Dayroom  Group Topic: Self-Esteem  Goal Area(s) Addresses:  Patient will identify positive ways to increase self-esteem. Patient will verbalize benefit of increased self-esteem.  Intervention: Worksheet, colored pencils  Activity: How I See Me.  LRT passed out worksheets of a mask with a blank face.  Patients were to fill in the mask with words and drawings of how they view themselves.   Education: Self-Esteem, Dentist.   Education Outcome: Needs additional education  Clinical Observations/Feedback: Pt did not attend group.    Victorino Sparrow, LRT/CTRS    Victorino Sparrow A 02/20/2016 11:45 AM

## 2016-02-20 NOTE — Progress Notes (Signed)
Maureen Swanson  02/20/2016 3:13 PM Maureen Swanson  MRN:  [id number] Subjective:  Patient reports that her diarrhea is better today.  Objective: Maureen Swanson is a  50 y.o. AA female , who has a hx of schizoaffective do , cocaine, alcohol, cannabis abuse, who voluntarily presents to [facility] S/P suicide attempt by OD and command AH. Maureen Swanson is well known to the [facility] unit .  Maureen Swanson today continues to have anxiety and depression and appears withdrawn. Pt continues to have SI - reports plan to use a knife. Maureen Swanson reports AH on and off - asking her to kill self. Maureen Swanson continues to need encouragement and support.  Per staff -Maureen Swanson has been complaining of diarrhea, only once today  Principal Problem: Schizoaffective disorder, bipolar type [facility]) Diagnosis:   Patient Active Problem List   Diagnosis Date Noted  . Schizoaffective disorder, bipolar type ([place]) [F25.0] 04/03/2015    Priority: High  . Cannabis use disorder, moderate, dependence ([place]) [F12.20] 11/20/2015  . Homicidal ideation [R45.850]   . Tobacco use disorder [F17.200] 07/20/2015  . Diabetes mellitus ([place]) [E11.9] 07/20/2015  . Cocaine use disorder, moderate, dependence ([place]) [F14.20] 04/03/2015  . Alcohol use disorder, moderate, dependence ([place]) [F10.20] 04/03/2015  . Acute ischemic stroke ([place]) [I63.9] 12/08/2014  . Left-sided weakness [M62.89] 12/08/2014  . 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 Knee pain [M25.569] 10/17/2010  . MAMMOGRAM, ABNORMAL, RIGHT [R92.8] 08/05/2010  . Dyslipidemia [E78.5] 06/21/2010  . AMENORRHEA, SECONDARY [N91.2] 10/29/2009  . 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 GERD [K21.9] 10/11/2007  . Essential hypertension [I10] 02/26/2007   Total Time spent with patient: 25 minutes  Past Psychiatric History: as per H&P.  Past Medical History:  Past Medical History:  Diagnosis Date  . Anxiety   . Arthritis    back and knees  . Asthma    daily and prn inhalers  . Atypical ductal  hyperplasia of breast 03/2012   right  . Bipolar 1 disorder ([place])   . CHF (congestive heart failure) ([place])   . Depression   . Diabetes mellitus    diet-controlled  . Gastric ulcer   . GERD (gastroesophageal reflux disease)   . Gout   . Headache(784.0)    migraines  . High cholesterol   . Hypertension    under control, has been on med. x 12 yrs.  . Substance abuse    crack cocaine  . TMJ (temporomandibular joint disorder)     Past Surgical History:  Procedure Laterality Date  . BREAST LUMPECTOMY WITH NEEDLE LOCALIZATION  04/19/2012   Procedure: BREAST LUMPECTOMY WITH NEEDLE LOCALIZATION;  Surgeon: [name], MD;  Location: [place];  Service: General;  Laterality: Right;  . KNEE ARTHROSCOPY W/ PARTIAL MEDIAL MENISCECTOMY  05/01/2010   right   Family History:  Family History  Problem Relation Age of Onset  . Diabetes Mother   . Breast cancer Mother   . Cancer Father   . Bipolar disorder Maternal Aunt   . Schizophrenia Maternal Grandfather   . Alcoholism Maternal Uncle    Family Psychiatric  History: See Above Social History: As per H&P. History  Alcohol Use  . 0.0 oz/week    Comment: reports she drinks 3 40 ounce beers daily     History  Drug Use  . Types: Cocaine    Comment: 09/28/2015    Social History   Social History  . Marital status: Single    Spouse name: N/A  .  Number of children: N/A  . Years of education: N/A   Social History Main Topics  . Smoking status: Current Every Day Smoker    Packs/day: 1.00    Years: 0.00    Types: Cigarettes  . Smokeless tobacco: Never Used  . Alcohol use 0.0 oz/week     Comment: reports she drinks 3 40 ounce beers daily  . Drug use:     Types: Cocaine     Comment: 09/28/2015  . Sexual activity: No   Other Topics Concern  . None   Social History Narrative  . None   Additional Social History:                         Sleep: Poor   Appetite:  Fair  Current  Medications: Current Facility-Administered Medications  Medication Dose Route Frequency Provider Last Rate Last Dose  . acetaminophen (TYLENOL) tablet 650 mg  650 mg Oral Q6H PRN [name], MD   650 mg at 02/20/16 1112  . alum & mag hydroxide-simeth (MAALOX/MYLANTA) 200-200-20 MG/5ML suspension 30 mL  30 mL Oral Q4H PRN Saramma Eappen, MD      . amLODipine (NORVASC) tablet 5 mg  5 mg Oral Daily Saramma Eappen, MD   5 mg at 02/20/16 0800  . ARIPiprazole ER SUSR 400 mg  400 mg Intramuscular Q28 days [name], MD   400 mg at 02/20/16 0808  . atenolol (TENORMIN) tablet 100 mg  100 mg Oral Daily [name], MD   100 mg at 02/20/16 0759  . atorvastatin (LIPITOR) tablet 40 mg  40 mg Oral q1800 [name], MD   40 mg at 02/19/16 1701  . benztropine (COGENTIN) tablet 0.5 mg  0.5 mg Oral QHS [name], MD   0.5 mg at 02/19/16 2155  . divalproex (DEPAKOTE) DR tablet 250 mg  250 mg Oral Q8H Saramma Eappen, MD   250 mg at 02/20/16 1432  . gabapentin (NEURONTIN) capsule 400 mg  400 mg Oral TID [name], MD   400 mg at 02/20/16 1205  . hydrOXYzine (ATARAX/VISTARIL) tablet 25 mg  25 mg Oral TID PRN [name], MD   25 mg at 02/20/16 1113  . hydrOXYzine (ATARAX/VISTARIL) tablet 50 mg  50 mg Oral QHS,MR X 1 [name], PA-C   50 mg at 02/19/16 2155  . insulin aspart (novoLOG) injection 0-15 Units  0-15 Units Subcutaneous TID WC [name], MD   0 Units at 02/19/16 1208  . insulin aspart (novoLOG) injection 0-5 Units  0-5 Units Subcutaneous QHS Saramma Eappen, MD      . insulin glargine (LANTUS) injection 25 Units  25 Units Subcutaneous QHS Kerrie Buffalo, NP      . linagliptin (TRADJENTA) tablet 5 mg  5 mg Oral Daily [name], MD   5 mg at 02/20/16 0759  . loratadine (CLARITIN) tablet 10 mg  10 mg Oral Daily [name], MD   10 mg at 02/20/16 0759  . magnesium hydroxide (MILK OF MAGNESIA) suspension 30 mL  30 mL Oral Daily PRN [name], MD      . magnesium  oxide (MAG-OX) tablet 400 mg  400 mg Oral TID WC Maureen Millers, MD   400 mg at 02/20/16 1205  . metFORMIN (GLUCOPHAGE) tablet 1,000 mg  1,000 mg Oral BID WC [name], MD   1,000 mg at 02/20/16 0759  . multivitamin with minerals tablet 1 tablet  1 tablet Oral Daily Saramma  Eappen, MD   1 tablet at 02/20/16 0759  . nicotine (NICODERM CQ - dosed in mg/24 hours) patch 21 mg  21 mg Transdermal Daily [name], MD   21 mg at 02/20/16 1109  . perphenazine (TRILAFON) tablet 2 mg  2 mg Oral BH-qamhs Saramma Eappen, MD   2 mg at 02/20/16 0800  . thiamine (B-1) injection 100 mg  100 mg Intramuscular Once [name], MD      . thiamine (VITAMIN B-1) tablet 100 mg  100 mg Oral Daily [name], MD   100 mg at 02/20/16 0759  . zolpidem (AMBIEN) tablet 5 mg  5 mg Oral QHS PRN [name], MD   5 mg at 02/19/16 2155    Lab Results:  Results for orders placed or performed during the hospital encounter of 02/13/16 (from the past 48 hour(s))  Glucose, capillary     Status: Abnormal   Collection Time: 02/18/16  4:56 PM  Result Value Ref Range   Glucose-Capillary 119 (H) 65 - 99 mg/dL   Comment 1 Notify RN    Comment 2 Document in Chart   Glucose, capillary     Status: Abnormal   Collection Time: 02/18/16  9:07 PM  Result Value Ref Range   Glucose-Capillary 119 (H) 65 - 99 mg/dL   Comment 1 Notify RN   Valproic acid level     Status: None   Collection Time: 02/19/16  6:22 AM  Result Value Ref Range   Valproic Acid Lvl 60 50.0 - 100.0 ug/mL    Comment: Performed at [facility]  Magnesium     Status: Abnormal   Collection Time: 02/19/16  6:22 AM  Result Value Ref Range   Magnesium 1.6 (L) 1.7 - 2.4 mg/dL    Comment: Performed at [facility]  Glucose, capillary     Status: None   Collection Time: 02/19/16  6:37 AM  Result Value Ref Range   Glucose-Capillary 73 65 - 99 mg/dL  Glucose, capillary     Status: Abnormal   Collection Time:  02/19/16 12:02 PM  Result Value Ref Range   Glucose-Capillary 119 (H) 65 - 99 mg/dL   Comment 1 Notify RN    Comment 2 Document in Chart   Glucose, capillary     Status: None   Collection Time: 02/19/16  5:03 PM  Result Value Ref Range   Glucose-Capillary 99 65 - 99 mg/dL   Comment 1 Notify RN    Comment 2 Document in Chart   Glucose, capillary     Status: Abnormal   Collection Time: 02/19/16  8:27 PM  Result Value Ref Range   Glucose-Capillary 111 (H) 65 - 99 mg/dL  Glucose, capillary     Status: None   Collection Time: 02/20/16  6:13 AM  Result Value Ref Range   Glucose-Capillary 65 65 - 99 mg/dL   Comment 1 Notify RN   Glucose, capillary     Status: Abnormal   Collection Time: 02/20/16  6:38 AM  Result Value Ref Range   Glucose-Capillary 112 (H) 65 - 99 mg/dL   Comment 1 Notify RN   Basic metabolic panel     Status: Abnormal   Collection Time: 02/20/16  6:50 AM  Result Value Ref Range   Sodium 142 135 - 145 mmol/L   Potassium 3.6 3.5 - 5.1 mmol/L   Chloride 108 101 - 111 mmol/L   CO2 27 22 - 32 mmol/L   Glucose, Bld  141 (H) 65 - 99 mg/dL   BUN 12 6 - 20 mg/dL   Creatinine, Ser 0.96 0.44 - 1.00 mg/dL   Calcium 9.3 8.9 - 10.3 mg/dL   GFR calc non Af Amer >60 >60 mL/min   GFR calc Af Amer >60 >60 mL/min    Comment: (Swanson) The eGFR has been calculated using the CKD EPI equation. This calculation has not been validated in all clinical situations. eGFR's persistently <60 mL/min signify possible Chronic Kidney Disease.    Anion gap 7 5 - 15    Comment: Performed at [facility]  Magnesium     Status: Abnormal   Collection Time: 02/20/16  6:50 AM  Result Value Ref Range   Magnesium 1.6 (L) 1.7 - 2.4 mg/dL    Comment: Performed at [facility]  Glucose, capillary     Status: Abnormal   Collection Time: 02/20/16 11:40 AM  Result Value Ref Range   Glucose-Capillary 111 (H) 65 - 99 mg/dL   Comment 1 Notify RN    Comment 2 Document in  Chart     Blood Alcohol level:  Lab Results  Component Value Date   ETH 6 (H) 02/13/2016   ETH <5 11/18/2015    Physical Findings: AIMS: Facial and Oral Movements Muscles of Facial Expression: None, normal Lips and Perioral Area: None, normal Jaw: None, normal Tongue: None, normal,Extremity Movements Upper (arms, wrists, hands, fingers): None, normal Lower (legs, knees, ankles, toes): None, normal, Trunk Movements Neck, shoulders, hips: None, normal, Overall Severity Severity of abnormal movements (highest score from questions above): None, normal Incapacitation due to abnormal movements: None, normal Patient's awareness of abnormal movements (rate only patient's report): No Awareness, Dental Status Current problems with teeth and/or dentures?: No Does patient usually wear dentures?: No  CIWA:  CIWA-Ar Total: 1 COWS:     Musculoskeletal: Strength & Muscle Tone: within normal limits Gait & Station: normal Patient leans: N/A  Psychiatric Specialty Exam: Review of Systems  Gastrointestinal: Positive for diarrhea.  Musculoskeletal: Positive for myalgias.  Psychiatric/Behavioral: Positive for depression, hallucinations and substance abuse. The patient is nervous/anxious.   All other systems reviewed and are negative.   Blood pressure 139/83, pulse 76, temperature 98.1 F (36.7 C), temperature source Oral, resp. rate 18, height _0  (1.6 m), weight 95.7 kg (211 lb).Body mass index is 37.38 kg/m.  General Appearance: Casual  Eye Contact::  Fair  Speech:  Clear and Coherent  Volume:  Normal  Mood:  Anxious, Depressed and Dysphoric   Affect:  Depressed  Thought Process:  Goal Directed and Descriptions of Associations: Circumstantial  Orientation:  Full (Time, Place, and Person)  Thought Content:  Hallucinations: Auditory on and off - command   Suicidal Thoughts:  Yes.  with intent/plan  To use a knife, has command AH asking her to kill self.  Homicidal Thoughts:  No   Memory:  Immediate;   Fair Recent;   Fair Remote;   Fair  Judgement:  Fair  Insight:  Shallow  Psychomotor Activity:  Restlessness  Concentration:  Fair  Recall:  [employer] of Knowledge:Fair  Language: Fair  Akathisia:  No  Handed:  Right  AIMS (if indicated):     Assets:  Communication Skills Desire for Improvement Resilience  ADL's:  Intact  Cognition: WNL  Sleep:  Number of Hours: 6.5     Treatment Plan Summary:Rozalia Jerilynn Mages Thielke is a  49 y.o.AA female , who has a hx of schizoaffective do ,  cocaine, alcohol, cannabis abuse, who voluntarily presents to [facility] s/p suicide attempt , continues to be depressed, although progressing .  Will continue treatment.  Daily contact with patient to assess and evaluate symptoms and progress in treatment and Medication management  Will continue CIWA/Librium protocol for alcohol withdrawal sx. Will continue Abilify Maintena IM 400 mg - last dose per pt was on august 10 th - repeat q28 days- next IM scheduled on 02/20/16. Will continue Gabapentin 400 mg po tid for mood lability/anxiety/pain. Will continue Ambien 10 mg po qhs prn for sleep. Will continue prozac 40 mg po daily for affective sx.( will hold if she continues to have diarrhea)  Will continue Depakote dr 250 mg po q8h for mood sx- depakote level on 02/19/16- 60 ug/ml ( therapeutic) - will hold depakote if she continues to have diarrhea. Will continue Trilafon 2 mg po bid for psychosis - to augment the abilify maintena IM . Will continue Mg oxide 400 mg po daily for hypomagnesemia - started on the weekend - Repeat Mg level on 02/19/16 today - 1.6 - will consult hospitalist for recommendations.Freda Munro Samuel Jester NP -working on it. Restarted home medications as needed. Will continue to monitor vitals ,medication compliance and treatment side effects while patient is here.  Will monitor for medical issues as well as call consult as needed.  CSW will continue  working on disposition. Patient referred  to a substance abuse treatment program- pending placement. Patient to participate in therapeutic milieu    Janett Labella, NP [facility] 02/20/2016, 3:13 PM   I agree with NP Progress Swanson  Gabriel Earing , MD

## 2016-02-20 NOTE — Progress Notes (Addendum)
Patient hypoglycemic this am (CBG 65). Hypoglycemic protocol initiated (1 tube oral dextrose gel). Will repeat CBG in 15 minutes.

## 2016-02-20 NOTE — Progress Notes (Signed)
Inpatient Diabetes Program Recommendations  AACE/ADA: New Consensus Statement on Inpatient Glycemic Control (2015)  Target Ranges:  Prepandial:   less than 140 mg/dL      Peak postprandial:   less than 180 mg/dL (1-2 hours)      Critically ill patients:  140 - 180 mg/dL   Results for PATRICHA, GAYLORD (MRN [id number]) as of 02/20/2016 11:12  Ref. Range 02/19/2016 06:37 02/19/2016 12:02 02/19/2016 17:03 02/19/2016 20:27  Glucose-Capillary Latest Ref Range: 65 - 99 mg/dL 73 119 (H) 99 111 (H)   Results for BELLAANN, RODER (MRN [id number]) as of 02/20/2016 11:12  Ref. Range 02/20/2016 06:13 02/20/2016 06:38  Glucose-Capillary Latest Ref Range: 65 - 99 mg/dL 65 112 (H)    Admit with: Suicidal Ideation  History: DM, Schizoaffective disorder, Polysubstance Abuse  Home DM Meds: Lantus 30 units QHS       Januvia 25 mg daily       Metformin 1000 mg bid  Current Insulin Orders: Lantus 30 units QHS      Novolog Moderate Correction Scale/ SSI (0-15 units) TID AC + HS      Tradjenta 5 mg daily      Metformin 1000 mg bid      MD- Please consider reducing Lantus to 25 units QHS  Patient with borderline low CBG yesterday (CBG 73 mg/dl) and Patient with Hypoglycemia this AM (CBG 65 mg/dl) after receiving Lantus 30 units both nights prior     --Will follow patient during hospitalization--  Wyn Quaker RN, MSN, CDE Diabetes Coordinator Inpatient Glycemic Control Team Team Pager: [phone] (8a-5p)

## 2016-02-20 NOTE — [facility] Group Notes (Signed)
[place] LCSW Group Therapy  02/20/2016 2:27 PM   Type of Therapy:  Group Therapy   Participation Level:  Engaged  Participation Quality:  Attentive   Affect:  Appropriate   Cognitive:  Alert   Insight:  Engaged  Engagement in Therapy:  Improving   Modes of Intervention:  Education, Exploration, Socialization   Summary of Progress/Problems: Patient was engaged throughout, stayed entire group session.   [name] from the [facility] was here to tell his story of recovery, inform patients about [facility] and play his guitar.   [name] 02/20/2016 2:27 PM

## 2016-02-21 LAB — GLUCOSE, CAPILLARY
GLUCOSE-CAPILLARY: 105 mg/dL — AB (ref 65–99)
Glucose-Capillary: 77 mg/dL (ref 65–99)
Glucose-Capillary: 120 mg/dL — ABNORMAL HIGH (ref 65–99)
Glucose-Capillary: 157 mg/dL — ABNORMAL HIGH (ref 65–99)

## 2016-02-21 LAB — BASIC METABOLIC PANEL
ANION GAP: 5 (ref 5–15)
BUN: 13 mg/dL (ref 6–20)
CALCIUM: 9.4 mg/dL (ref 8.9–10.3)
CO2: 29 mmol/L (ref 22–32)
CREATININE: 0.9 mg/dL (ref 0.44–1.00)
Chloride: 109 mmol/L (ref 101–111)
GLUCOSE: 70 mg/dL (ref 65–99)
Potassium: 3.9 mmol/L (ref 3.5–5.1)
Sodium: 143 mmol/L (ref 135–145)

## 2016-02-21 LAB — PHOSPHORUS: Phosphorus: 3.8 mg/dL (ref 2.5–4.6)

## 2016-02-21 LAB — MAGNESIUM: Magnesium: 1.9 mg/dL (ref 1.7–2.4)

## 2016-02-21 MED ORDER — LOPERAMIDE HCL 2 MG PO CAPS: 2.0000 mg | ORAL_CAPSULE | ORAL | Status: DC | PRN

## 2016-02-21 MED ORDER — TRAZODONE HCL 100 MG PO TABS
100.0000 mg | ORAL_TABLET | Freq: Every day | ORAL | Status: DC
  Administered 2016-02-21 – 2016-02-23 (×3): 100 mg via ORAL
  Filled 2016-02-21 (×5): qty 1

## 2016-02-21 MED ORDER — LOPERAMIDE HCL 2 MG PO CAPS
ORAL_CAPSULE | ORAL | Status: AC
  Administered 2016-02-21: 2 mg
  Filled 2016-02-21: qty 1

## 2016-02-21 MED ORDER — INSULIN GLARGINE 100 UNIT/ML ~~LOC~~ SOLN
20.0000 [IU] | Freq: Every day | SUBCUTANEOUS | Status: DC
  Administered 2016-02-21 – 2016-02-24 (×4): 20 [IU] via SUBCUTANEOUS

## 2016-02-21 NOTE — Progress Notes (Signed)
Recreation Therapy Notes  Date: 02/21/16 Time: 1000 Location: 500 Hall Dayroom  Group Topic: Leisure Education  Goal Area(s) Addresses:  Patient will identify positive leisure activities.  Patient will identify one positive benefit of participation in leisure activities.   Behavioral Response: Engaged   Intervention: 20 styrofoam/plastic cups, 2 soft sponge balls  Activity: Bowling.  LRT set up the cups so there would be 2 bowling "lanes".  LRT divided the group into teams.  Each person would get two chances to knock down as many "pins" as possible.  Each cup that got knocked down equalled one point.  The team that reached the top score first won.  Education:  Leisure Education, Dentist  Education Outcome: Acknowledges education/In group clarification offered/Needs additional education  Clinical Observations/Feedback: Pt was flat but active.  Pt worked well with her peers throughout group.    Victorino Sparrow, LRT/CTRS     Victorino Sparrow A 02/21/2016 12:42 PM

## 2016-02-21 NOTE — [facility] Group Notes (Signed)
  Type of Therapy:  Group Therapy   Participation Level:  Engaged  Participation Quality:  Attentive  Affect:  Appropriate   Cognitive:  Alert   Insight:  Engaged  Engagement in Therapy:  Improving   Modes of Intervention:  Education, Exploration, Socialization   Summary of Progress/Problems:  [employer], spelling the word obstacle. Group members were asked to identify an obstacle/obstacles they are currently facing and also first steps in overcoming those obstacles.   Maureen Swanson was engaged throughout, and stayed entire group session. Maureen Swanson stated that the obstacles she is dealing with are housing issues, financial issues, and the people she chooses to interact with. Stated steps in overcoming these obstacles is "taking it one day at a time."  She also stated that she has been asking for help rather than isolating and shutting down.  [name] 02/21/2016 2:00 PM

## 2016-02-21 NOTE — Progress Notes (Signed)
[facility] MD Progress Note  02/21/2016 3:26 PM Maureen Swanson  MRN:  [id number] Subjective:  Patient reports " I am not sleeping , I still hear voices asking me to kill myself."   Objective: Maureen Swanson is a  50 y.o. AA female , who has a hx of schizoaffective do , cocaine, alcohol, cannabis abuse, who voluntarily presents to [facility] S/P suicide attempt by OD and command AH. Maureen Swanson is well known to the [facility] unit .  Maureen Swanson today continues to have anxiety and depression and appears withdrawn. Pt continues to have SI on and off , this AM denies plan. Maureen Swanson reports AH on and off - asking her to kill self, which has been ongoing since admission. Maureen Swanson also has sleep issues and wants to get back on her trazodone. Pt continues to have diarrhea - as per IM consult could be due to a virus , but will hold depakote and prozac if she continues to have it.Will monitor closely. Maureen Swanson continues to need encouragement and support.   Principal Problem: Schizoaffective disorder, bipolar type ([place]) Diagnosis:   Patient Active Problem List   Diagnosis Date Noted  . Hypomagnesemia [E83.42]   . Cannabis use disorder, moderate, dependence ([place]) [F12.20] 11/20/2015  . Homicidal ideation [R45.850]   . Tobacco use disorder [F17.200] 07/20/2015  . Diabetes mellitus ([place]) [E11.9] 07/20/2015  . Cocaine use disorder, moderate, dependence ([place]) [F14.20] 04/03/2015  . Schizoaffective disorder, bipolar type ([place]) [F25.0] 04/03/2015  . Alcohol use disorder, moderate, dependence ([place]) [F10.20] 04/03/2015  . Acute ischemic stroke ([place]) [I63.9] 12/08/2014  . Left-sided weakness [M62.89] 12/08/2014  . 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 Knee pain [M25.569] 10/17/2010  . MAMMOGRAM, ABNORMAL, RIGHT [R92.8] 08/05/2010  . Dyslipidemia [E78.5] 06/21/2010  . AMENORRHEA, SECONDARY [N91.2] 10/29/2009  . 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 GERD [K21.9] 10/11/2007  . Essential hypertension [I10] 02/26/2007   Total Time  spent with patient: 25 minutes  Past Psychiatric History: as per H&P.  Past Medical History:  Past Medical History:  Diagnosis Date  . Anxiety   . Arthritis    back and knees  . Asthma    daily and prn inhalers  . 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 Bipolar 1 disorder ([place])   . CHF (congestive heart failure) ([place])   . Depression   . Diabetes mellitus    diet-controlled  . Gastric ulcer   . GERD (gastroesophageal reflux disease)   . Gout   . Headache(784.0)    migraines  . High cholesterol   . Hypertension    under control, has been on med. x 12 yrs.  . Substance abuse    crack cocaine  . TMJ (temporomandibular joint disorder)     Past Surgical History:  Procedure Laterality Date  . BREAST LUMPECTOMY WITH NEEDLE LOCALIZATION  04/19/2012   Procedure: BREAST LUMPECTOMY WITH NEEDLE LOCALIZATION;  Surgeon: [name], MD;  Location: [place];  Service: General;  Laterality: Right;  . KNEE ARTHROSCOPY W/ PARTIAL MEDIAL MENISCECTOMY  05/01/2010   right   Family History:  Family History  Problem Relation Age of Onset  . Diabetes Mother   . Breast cancer Mother   . Cancer Father   . Bipolar disorder Maternal Aunt   . Schizophrenia Maternal Grandfather   . Alcoholism Maternal Uncle    Family Psychiatric  History: See Above Social History: As per H&P. History  Alcohol Use  . 0.0  oz/week    Comment: reports she drinks 3 40 ounce beers daily     History  Drug Use  . Types: Cocaine    Comment: 09/28/2015    Social History   Social History  . Marital status: Single    Spouse name: N/A  . Number of children: N/A  . Years of education: N/A   Social History Main Topics  . Smoking status: Current Every Day Smoker    Packs/day: 1.00    Years: 0.00    Types: Cigarettes  . Smokeless tobacco: Never Used  . Alcohol use 0.0 oz/week     Comment: reports she drinks 3 40 ounce beers daily  . Drug use:     Types: Cocaine      Comment: 09/28/2015  . Sexual activity: No   Other Topics Concern  . None   Social History Narrative  . None   Additional Social History:                         Sleep: Poor   Appetite:  Fair  Current Medications: Current Facility-Administered Medications  Medication Dose Route Frequency Provider Last Rate Last Dose  . acetaminophen (TYLENOL) tablet 650 mg  650 mg Oral Q6H PRN  , MD   650 mg at 02/20/16 1841  . alum & mag hydroxide-simeth (MAALOX/MYLANTA) 200-200-20 MG/5ML suspension 30 mL  30 mL Oral Q4H PRN  , MD      . amLODipine (NORVASC) tablet 5 mg  5 mg Oral Daily  , MD   5 mg at 02/21/16 0800  . ARIPiprazole ER SUSR 400 mg  400 mg Intramuscular Q28 days  , MD   400 mg at 02/20/16 0808  . atenolol (TENORMIN) tablet 100 mg  100 mg Oral Daily  , MD   100 mg at 02/21/16 0759  . atorvastatin (LIPITOR) tablet 40 mg  40 mg Oral q1800  , MD   40 mg at 02/20/16 1723  . benztropine (COGENTIN) tablet 0.5 mg  0.5 mg Oral QHS  , MD   0.5 mg at 02/20/16 2105  . divalproex (DEPAKOTE) DR tablet 250 mg  250 mg Oral Q8H  , MD   Stopped at 02/21/16 1421  . gabapentin (NEURONTIN) capsule 400 mg  400 mg Oral TID  , MD   400 mg at 02/21/16 1151  . hydrOXYzine (ATARAX/VISTARIL) tablet 25 mg  25 mg Oral TID PRN  , MD   25 mg at 02/20/16 1843  . hydrOXYzine (ATARAX/VISTARIL) tablet 50 mg  50 mg Oral QHS,MR X 1 Spencer E Simon, PA-C   50 mg at 02/20/16 2226  . insulin aspart (novoLOG) injection 0-15 Units  0-15 Units Subcutaneous TID WC  , MD   2 Units at 02/20/16 1727  . insulin aspart (novoLOG) injection 0-5 Units  0-5 Units Subcutaneous QHS  , MD      . insulin glargine (LANTUS) injection 20 Units  20 Units Subcutaneous QHS Nishant Dhungel, MD      . linagliptin (TRADJENTA) tablet 5 mg  5 mg Oral Daily  , MD   5 mg at  02/21/16 0800  . loperamide (IMODIUM) capsule 2 mg  2 mg Oral PRN  , MD      . loratadine (CLARITIN) tablet 10 mg  10 mg Oral Daily  , MD   10 mg at 02/21/16 0800  . magnesium hydroxide (MILK OF MAGNESIA) suspension 30 mL  30 mL Oral Daily   PRN [name], MD      . metFORMIN (GLUCOPHAGE) tablet 1,000 mg  1,000 mg Oral BID WC Shadoe Bethel, MD   1,000 mg at 02/21/16 0800  . multivitamin with minerals tablet 1 tablet  1 tablet Oral Daily [name], MD   1 tablet at 02/21/16 0800  . nicotine (NICODERM CQ - dosed in mg/24 hours) patch 21 mg  21 mg Transdermal Daily [name], MD   21 mg at 02/21/16 0758  . perphenazine (TRILAFON) tablet 2 mg  2 mg Oral BH-qamhs Ayren Zumbro, MD   2 mg at 02/21/16 0800  . thiamine (B-1) injection 100 mg  100 mg Intramuscular Once [name], MD      . thiamine (VITAMIN B-1) tablet 100 mg  100 mg Oral Daily Dierdra Salameh, MD   100 mg at 02/21/16 0800  . traZODone (DESYREL) tablet 100 mg  100 mg Oral QHS [name], MD        Lab Results:  Results for orders placed or performed during the hospital encounter of 02/13/16 (from the past 48 hour(s))  Glucose, capillary     Status: None   Collection Time: 02/19/16  5:03 PM  Result Value Ref Range   Glucose-Capillary 99 65 - 99 mg/dL   Comment 1 Notify RN    Comment 2 Document in Chart   Glucose, capillary     Status: Abnormal   Collection Time: 02/19/16  8:27 PM  Result Value Ref Range   Glucose-Capillary 111 (H) 65 - 99 mg/dL  Glucose, capillary     Status: None   Collection Time: 02/20/16  6:13 AM  Result Value Ref Range   Glucose-Capillary 65 65 - 99 mg/dL   Comment 1 Notify RN   Glucose, capillary     Status: Abnormal   Collection Time: 02/20/16  6:38 AM  Result Value Ref Range   Glucose-Capillary 112 (H) 65 - 99 mg/dL   Comment 1 Notify RN   Basic metabolic panel     Status: Abnormal   Collection Time: 02/20/16  6:50 AM  Result Value Ref Range    Sodium 142 135 - 145 mmol/L   Potassium 3.6 3.5 - 5.1 mmol/L   Chloride 108 101 - 111 mmol/L   CO2 27 22 - 32 mmol/L   Glucose, Bld 141 (H) 65 - 99 mg/dL   BUN 12 6 - 20 mg/dL   Creatinine, Ser 0.96 0.44 - 1.00 mg/dL   Calcium 9.3 8.9 - 10.3 mg/dL   GFR calc non Af Amer >60 >60 mL/min   GFR calc Af Amer >60 >60 mL/min    Comment: (NOTE) The eGFR has been calculated using the CKD EPI equation. This calculation has not been validated in all clinical situations. eGFR's persistently <60 mL/min signify possible Chronic Kidney Disease.    Anion gap 7 5 - 15    Comment: Performed at [facility]  Magnesium     Status: Abnormal   Collection Time: 02/20/16  6:50 AM  Result Value Ref Range   Magnesium 1.6 (L) 1.7 - 2.4 mg/dL    Comment: Performed at [facility]  Glucose, capillary     Status: Abnormal   Collection Time: 02/20/16 11:40 AM  Result Value Ref Range   Glucose-Capillary 111 (H) 65 - 99 mg/dL   Comment 1 Notify RN    Comment 2 Document in Chart   Glucose, capillary     Status: Abnormal   Collection Time:  02/20/16  5:16 PM  Result Value Ref Range   Glucose-Capillary 142 (H) 65 - 99 mg/dL  Glucose, capillary     Status: Abnormal   Collection Time: 02/20/16  8:34 PM  Result Value Ref Range   Glucose-Capillary 132 (H) 65 - 99 mg/dL  Glucose, capillary     Status: None   Collection Time: 02/21/16  6:31 AM  Result Value Ref Range   Glucose-Capillary 77 65 - 99 mg/dL  Basic metabolic panel     Status: None   Collection Time: 02/21/16  6:41 AM  Result Value Ref Range   Sodium 143 135 - 145 mmol/L   Potassium 3.9 3.5 - 5.1 mmol/L   Chloride 109 101 - 111 mmol/L   CO2 29 22 - 32 mmol/L   Glucose, Bld 70 65 - 99 mg/dL   BUN 13 6 - 20 mg/dL   Creatinine, Ser 0.90 0.44 - 1.00 mg/dL   Calcium 9.4 8.9 - 10.3 mg/dL   GFR calc non Af Amer >60 >60 mL/min   GFR calc Af Amer >60 >60 mL/min    Comment: (NOTE) The eGFR has been calculated using  the CKD EPI equation. This calculation has not been validated in all clinical situations. eGFR's persistently <60 mL/min signify possible Chronic Kidney Disease.    Anion gap 5 5 - 15    Comment: Performed at [facility]  Magnesium     Status: None   Collection Time: 02/21/16  6:41 AM  Result Value Ref Range   Magnesium 1.9 1.7 - 2.4 mg/dL    Comment: Performed at [facility]  Phosphorus     Status: None   Collection Time: 02/21/16  6:41 AM  Result Value Ref Range   Phosphorus 3.8 2.5 - 4.6 mg/dL    Comment: Performed at [facility]  Glucose, capillary     Status: Abnormal   Collection Time: 02/21/16 11:41 AM  Result Value Ref Range   Glucose-Capillary 120 (H) 65 - 99 mg/dL   Comment 1 Notify RN    Comment 2 Document in Chart     Blood Alcohol level:  Lab Results  Component Value Date   ETH 6 (H) 02/13/2016   ETH <5 11/18/2015    Physical Findings: AIMS: Facial and Oral Movements Muscles of Facial Expression: None, normal Lips and Perioral Area: None, normal Jaw: None, normal Tongue: None, normal,Extremity Movements Upper (arms, wrists, hands, fingers): None, normal Lower (legs, knees, ankles, toes): None, normal, Trunk Movements Neck, shoulders, hips: None, normal, Overall Severity Severity of abnormal movements (highest score from questions above): None, normal Incapacitation due to abnormal movements: None, normal Patient's awareness of abnormal movements (rate only patient's report): No Awareness, Dental Status Current problems with teeth and/or dentures?: No Does patient usually wear dentures?: No  CIWA:  CIWA-Ar Total: 1 COWS:     Musculoskeletal: Strength & Muscle Tone: within normal limits Gait & Station: normal Patient leans: N/A  Psychiatric Specialty Exam: Review of Systems  Gastrointestinal: Positive for diarrhea.  Musculoskeletal: Positive for myalgias.  Psychiatric/Behavioral: Positive for  depression, hallucinations and substance abuse. The patient is nervous/anxious.   All other systems reviewed and are negative.   Blood pressure (!) 142/96, pulse 87, temperature 98.9 F (37.2 C), temperature source Oral, resp. rate 18, height 5' 3" (1.6 m), weight 95.7 kg (211 lb).Body mass index is 37.38 kg/m.  General Appearance: Casual  Eye Contact::  Fair  Speech:  Clear and Coherent  Volume:  Normal  Mood:  Anxious, Depressed and Dysphoric   Affect:  Depressed  Thought Process:  Goal Directed and Descriptions of Associations: Circumstantial  Orientation:  Full (Time, Place, and Person)  Thought Content:  Hallucinations: Auditory on and off - command   Suicidal Thoughts:  Yes.  without intent/plan  has command AH asking her to kill self.  Homicidal Thoughts:  No  Memory:  Immediate;   Fair Recent;   Fair Remote;   Fair  Judgement:  Fair  Insight:  Shallow  Psychomotor Activity:  Restlessness  Concentration:  Fair  Recall:  [employer] of Knowledge:Fair  Language: Fair  Akathisia:  No  Handed:  Right  AIMS (if indicated):     Assets:  Communication Skills Desire for Improvement Resilience  ADL's:  Intact  Cognition: WNL  Sleep:  Number of Hours: 6.75     Treatment Plan Summary:Mushka Jerilynn Mages Olenick is a  50 y.o.AA female , who has a hx of schizoaffective do , cocaine, alcohol, cannabis abuse, who voluntarily presents to [facility] s/p suicide attempt , continues to be depressed, although progressing .  Will continue treatment.  Daily contact with patient to assess and evaluate symptoms and progress in treatment and Medication management  Will continue CIWA/Librium protocol for alcohol withdrawal sx. Will continue Abilify Maintena IM 400 mg - last dose per pt was on august 10 th - repeat q28 days- last received IM on  02/20/16. Will continue Gabapentin 400 mg po tid for mood lability/anxiety/pain. Will discontinue Ambien for lack of efficacy and start Trazodone 100 mg po qhs for  sleep. Will continue prozac 40 mg po daily for affective sx.( will hold if she continues to have diarrhea)  Will continue Depakote dr 250 mg po q8h for mood sx- depakote level on 02/19/16- 60 ug/ml ( therapeutic) - will hold depakote if she continues to have diarrhea. Will continue Trilafon 2 mg po bid for psychosis - to augment the abilify maintena IM . Pt with multiple medical issues - please Hospitalist /diabetic consult notes. Restarted home medications as needed. Will continue to monitor vitals ,medication compliance and treatment side effects while patient is here.  Will monitor for medical issues as well as call consult as needed.  CSW will continue  working on disposition. Patient referred to a substance abuse treatment program- pending placement. Patient to participate in therapeutic milieu    Diana Armijo, MD 02/21/2016, 3:26 PM

## 2016-02-21 NOTE — Progress Notes (Signed)
   Patient not seen today.. Discussed with patient's nurse at [facility]. Patient reported no further  further diarrhea since yesterday.   Vitals stable except for mildly elevated BP.  Diarrhea  agree this is likely viral. No further episodes. Continue loperamide as needed.   hypomagnesemia Replenished oral magnesium with good response ( 1.9 this am). Will d/c magnesium oxide.  Diabetes mellitus type 2  cbg off 77 this am. Will reduce lantus to 20 units as per diabetic coordinator recommendations. Monitor on sliding scale coverage. Continue tradjenta and metformin.  Essential hypertension Blood pressure slightly elevated. Continue atenolol and amlodipine. If BP persistently elevated( SBP>140 and DBP>85) today I would recommend increasing amlodipine dose to 10 mg daily.  Schizoaffective disorder Management per primary team.  Will sign off . Please call for any further questions.

## 2016-02-21 NOTE — Tx Team (Signed)
Interdisciplinary Treatment and Diagnostic Plan Update  02/21/2016 Time of Session: 8:32 AM  Maureen Swanson MRN: [id number]  Principal Diagnosis: Schizoaffective disorder, bipolar type ([place])  Secondary Diagnoses: Principal Problem:   Schizoaffective disorder, bipolar type ([place]) Active Problems:   Essential hypertension   Cocaine use disorder, moderate, dependence (HCC)   Alcohol use disorder, moderate, dependence (HCC)   Tobacco use disorder   Diabetes mellitus ([place])   Cannabis use disorder, moderate, dependence ([place])   Hypomagnesemia   Current Medications:  Current Facility-Administered Medications  Medication Dose Route Frequency Provider Last Rate Last Dose  . acetaminophen (TYLENOL) tablet 650 mg  650 mg Oral Q6H PRN [name], MD   650 mg at 02/20/16 1841  . alum & mag hydroxide-simeth (MAALOX/MYLANTA) 200-200-20 MG/5ML suspension 30 mL  30 mL Oral Q4H PRN Saramma Eappen, MD      . amLODipine (NORVASC) tablet 5 mg  5 mg Oral Daily Saramma Eappen, MD   5 mg at 02/21/16 0800  . ARIPiprazole ER SUSR 400 mg  400 mg Intramuscular Q28 days [name], MD   400 mg at 02/20/16 0808  . atenolol (TENORMIN) tablet 100 mg  100 mg Oral Daily [name], MD   100 mg at 02/21/16 0759  . atorvastatin (LIPITOR) tablet 40 mg  40 mg Oral q1800 [name], MD   40 mg at 02/20/16 1723  . benztropine (COGENTIN) tablet 0.5 mg  0.5 mg Oral QHS [name], MD   0.5 mg at 02/20/16 2105  . divalproex (DEPAKOTE) DR tablet 250 mg  250 mg Oral Q8H Saramma Eappen, MD   250 mg at 02/21/16 0649  . gabapentin (NEURONTIN) capsule 400 mg  400 mg Oral TID [name], MD   400 mg at 02/21/16 0800  . hydrOXYzine (ATARAX/VISTARIL) tablet 25 mg  25 mg Oral TID PRN [name], MD   25 mg at 02/20/16 1843  . hydrOXYzine (ATARAX/VISTARIL) tablet 50 mg  50 mg Oral QHS,MR X 1 Spencer E Simon, PA-C   50 mg at 02/20/16 2226  . insulin aspart (novoLOG) injection 0-15 Units  0-15 Units Subcutaneous TID  WC [name], MD   2 Units at 02/20/16 1727  . insulin aspart (novoLOG) injection 0-5 Units  0-5 Units Subcutaneous QHS Saramma Eappen, MD      . insulin glargine (LANTUS) injection 25 Units  25 Units Subcutaneous QHS Kerrie Buffalo, NP   25 Units at 02/20/16 2105  . linagliptin (TRADJENTA) tablet 5 mg  5 mg Oral Daily Saramma Eappen, MD   5 mg at 02/21/16 0800  . loratadine (CLARITIN) tablet 10 mg  10 mg Oral Daily Saramma Eappen, MD   10 mg at 02/21/16 0800  . magnesium hydroxide (MILK OF MAGNESIA) suspension 30 mL  30 mL Oral Daily PRN [name], MD      . magnesium oxide (MAG-OX) tablet 400 mg  400 mg Oral TID WC Tawni Millers, MD   400 mg at 02/21/16 0649  . metFORMIN (GLUCOPHAGE) tablet 1,000 mg  1,000 mg Oral BID WC Saramma Eappen, MD   1,000 mg at 02/21/16 0800  . multivitamin with minerals tablet 1 tablet  1 tablet Oral Daily [name], MD   1 tablet at 02/21/16 0800  . nicotine (NICODERM CQ - dosed in mg/24 hours) patch 21 mg  21 mg Transdermal Daily [name], MD   21 mg at 02/21/16 0758  . perphenazine (TRILAFON) tablet 2 mg  2 mg Oral BH-qamhs Saramma Eappen,  MD   2 mg at 02/21/16 0800  . thiamine (B-1) injection 100 mg  100 mg Intramuscular Once Saramma Eappen, MD      . thiamine (VITAMIN B-1) tablet 100 mg  100 mg Oral Daily Saramma Eappen, MD   100 mg at 02/21/16 0800  . zolpidem (AMBIEN) tablet 5 mg  5 mg Oral QHS PRN [name], MD   5 mg at 02/20/16 2108    PTA Medications: Prescriptions Prior to Admission  Medication Sig Dispense Refill Last Dose  . amLODipine (NORVASC) 5 MG tablet Take 1 tablet (5 mg total) by mouth daily. For high blood pressure 1 tablet 0 Past Month at Unknown time  . ARIPiprazole 400 MG SUSR Inject 400 mg into the muscle every 28 (twenty-eight) days. (Due to be administered on 12-19-15): For mood control 1 each 0 01/24/2016  . aspirin 81 MG chewable tablet Chew 1 tablet (81 mg total) by mouth daily. For heart health 30  tablet 0 Past Week at Unknown time  . atenolol (TENORMIN) 100 MG tablet Take 1 tablet (100 mg total) by mouth daily. For high blood pressure 30 tablet 0 Past Week at 8am  . atorvastatin (LIPITOR) 40 MG tablet Take 1 tablet (40 mg total) by mouth every evening. For high cholesterol 1 tablet 0 Past Month at Unknown time  . cetirizine (ZYRTEC) 10 MG tablet Take 1 tablet (10 mg total) by mouth daily. (May purchase this medicines from over the counter at yr local pharmacy): For allergies   Past Month at Unknown time  . FLUoxetine (PROZAC) 40 MG capsule Take 1 capsule (40 mg total) by mouth daily. For depression 30 capsule 0 Past Month at Unknown time  . furosemide (LASIX) 40 MG tablet Take 40 mg by mouth daily.   Past Month at Unknown time  . gabapentin (NEURONTIN) 400 MG capsule Take 1 capsule (400 mg total) by mouth 3 (three) times daily. For agitation 90 capsule 0 Past Month at Unknown time  . insulin glargine (LANTUS) 100 UNIT/ML injection Inject 0.3 mLs (30 Units total) into the skin at bedtime. For diabetes management 10 mL 1 Past Month at Unknown time  . linagliptin (TRADJENTA) 5 MG TABS tablet Take 1 tablet (5 mg total) by mouth daily. For diabetes management (Patient not taking: Reported on 02/13/2016) 1 tablet 0 Not Taking at Unknown time  . metFORMIN (GLUCOPHAGE) 1000 MG tablet Take 1 tablet (1,000 mg total) by mouth 2 (two) times daily with a meal. For diabetes management 1 tablet 0 Past Month at Unknown time  . metFORMIN (GLUCOPHAGE) 1000 MG tablet Take 1 tablet (1,000 mg total) by mouth 2 (two) times daily with a meal. (Patient not taking: Reported on 02/13/2016) 1 tablet 0 Not Taking at Unknown time  . nicotine (NICODERM CQ - DOSED IN MG/24 HOURS) 21 mg/24hr patch Place 1 patch (21 mg total) onto the skin daily at 6 (six) AM. For smoking cessation 28 patch 0 Past Month at Unknown time  . QUEtiapine (SEROQUEL) 300 MG tablet Take 2 tablets (600 mg total) by mouth at bedtime. For mood control 120  tablet 0 02/12/2016 at Unknown time  . sitaGLIPtin (JANUVIA) 25 MG tablet Take 25 mg by mouth daily.   Past Month at Unknown time  . traZODone (DESYREL) 150 MG tablet Take 1 tablet (150 mg total) by mouth at bedtime. For sleep 30 tablet 0 02/12/2016 at Unknown time    Treatment Modalities: Medication Management, Group therapy, Case management,  1 to  1 session with clinician, Psychoeducation, Recreational therapy.   Physician Treatment Plan for Primary Diagnosis: Schizoaffective disorder, bipolar type ([place]) Long Term Goal(s): Improvement in symptoms so as ready for discharge  Short Term Goals: Ability to disclose and discuss suicidal ideas  Medication Management: Evaluate patient's response, side effects, and tolerance of medication regimen.  Therapeutic Interventions: 1 to 1 sessions, Unit Group sessions and Medication administration.  Evaluation of Outcomes: Progressing  Physician Treatment Plan for Secondary Diagnosis: Principal Problem:   Schizoaffective disorder, bipolar type ([place]) Active Problems:   Essential hypertension   Cocaine use disorder, moderate, dependence (HCC)   Alcohol use disorder, moderate, dependence (HCC)   Tobacco use disorder   Diabetes mellitus ([place])   Cannabis use disorder, moderate, dependence ([place])   Hypomagnesemia   Long Term Goal(s): Improvement in symptoms so as ready for discharge  Short Term Goals: Ability to identify triggers associated with substance abuse/mental health issues will improve  Medication Management: Evaluate patient's response, side effects, and tolerance of medication regimen.  Therapeutic Interventions: 1 to 1 sessions, Unit Group sessions and Medication administration.  Evaluation of Outcomes: Not Met   RN Treatment Plan for Primary Diagnosis: Schizoaffective disorder, bipolar type ([place]) Long Term Goal(s): Knowledge of disease and therapeutic regimen to maintain health will improve  Short Term Goals: Ability to disclose  and discuss suicidal ideas and Ability to identify and develop effective coping behaviors will improve  Medication Management: RN will administer medications as ordered by provider, will assess and evaluate patient's response and provide education to patient for prescribed medication. RN will report any adverse and/or side effects to prescribing provider.  Therapeutic Interventions: 1 on 1 counseling sessions, Psychoeducation, Medication administration, Evaluate responses to treatment, Monitor vital signs and CBGs as ordered, Perform/monitor CIWA, COWS, AIMS and Fall Risk screenings as ordered, Perform wound care treatments as ordered.  Evaluation of Outcomes: Progressing   LCSW Treatment Plan for Primary Diagnosis: Schizoaffective disorder, bipolar type ([place]) Long Term Goal(s): Safe transition to appropriate next level of care at discharge, Engage patient in therapeutic group addressing interpersonal concerns.  Short Term Goals: Engage patient in aftercare planning with referrals and resources and Identify triggers associated with mental health/substance abuse issues  Therapeutic Interventions: Assess for all discharge needs, 1 to 1 time with Social worker, Explore available resources and support systems, Assess for adequacy in community support network, Educate family and significant other(s) on suicide prevention, Complete Psychosocial Assessment, Interpersonal group therapy.  Evaluation of Outcomes: Progressing   Progress in Treatment: Attending groups: Yes Participating in groups: Yes Taking medication as prescribed: Yes Toleration medication: Yes, no side effects reported at this time Family/Significant other contact made: Yes  ACT team Patient understands diagnosis: Yes AEB asking for help with SI and AH Discussing patient identified problems/goals with staff: Yes Medical problems stabilized or resolved: Yes Denies suicidal/homicidal ideation: Yes Issues/concerns per patient  self-inventory: None Other: N/A  New problem(s) identified: None identified at this time.   New Short Term/Long Term Goal(s): None identified at this time.   Discharge Plan or Barriers: Patient requesting referral to [facility], back-up plan unknown at this time.  9/7:  Pt psychiatric symptoms preventing placement at [facility]; ACT team coming to day to talk with her about living options  Reason for Continuation of Hospitalization: Anxiety Depression Hallucinations Medication stabilization Suicidal ideation  Estimated Length of Stay: 1-4 days  Attendees: Patient: 02/21/2016  8:32 AM  Physician: [name], MD 02/21/2016  8:32 AM  Nursing: Hoy Register, RN 02/21/2016  8:32 AM  RN Care Manager: Lars Pinks, RN 02/21/2016  8:32 AM  Social Worker: Ripley Fraise 02/21/2016  8:32 AM  Recreational Therapist: Marjette  02/21/2016  8:32 AM  Other: Norberto Sorenson 02/21/2016  8:32 AM  Other: [name]  02/21/2016  8:32 AM    Scribe for Treatment Team:  [name] 02/21/2016 8:32 AM

## 2016-02-21 NOTE — Progress Notes (Signed)
Inpatient Diabetes Program Recommendations  AACE/ADA: New Consensus Statement on Inpatient Glycemic Control (2015)  Target Ranges:  Prepandial:   less than 140 mg/dL      Peak postprandial:   less than 180 mg/dL (1-2 hours)      Critically ill patients:  140 - 180 mg/dL   Results for EVAGELIA, LANTAGNE (MRN [id number]) as of 02/21/2016 09:58  Ref. Range 02/20/2016 06:13 02/20/2016 06:38 02/20/2016 11:40 02/20/2016 17:16 02/20/2016 20:34  Glucose-Capillary Latest Ref Range: 65 - 99 mg/dL 65 112 (H) 111 (H) 142 (H) 132 (H)   Results for AIDYN, GUNSALLUS (MRN [id number]) as of 02/21/2016 09:58  Ref. Range 02/21/2016 06:31  Glucose-Capillary Latest Ref Range: 65 - 99 mg/dL 77    Home DM Meds: Lantus 30 units QHS                             Januvia 25 mg daily                             Metformin 1000 mg bid  Current Insulin Orders: Lantus 25 units QHS                                       Novolog Moderate Correction Scale/ SSI (0-15 units) TID AC + HS                                       Tradjenta 5 mg daily                                       Metformin 1000 mg bid      MD- Please consider reducing Lantus further to 20 units QHS  Patient with borderline low CBG this AM: 77 mg/dl    --Will follow patient during hospitalization--  Wyn Quaker RN, MSN, CDE Diabetes Coordinator Inpatient Glycemic Control Team Team Pager: [phone] (8a-5p)

## 2016-02-21 NOTE — Progress Notes (Signed)
DAR NOTE: Patient presents with flat affect and depressed mood.  Denies auditory and visual hallucinations.  Reports suicidal thoughts on self inventory form but contracts for safety.  Rates depression at 8, hopelessness at 8, and anxiety at 8.  Described energy level as low and concentration as poor.  Maintained on routine safety checks.  Medications given as prescribed.  Support and encouragement offered as needed.  Attended group and participated.  States goal for today is "self esteem and depression."  Patient observed socializing with peers in the dayroom.  Minimal interaction with staff and peers.  Patient received Imodium 2 mg for complain of having diarrhea x 2.  Patient ambulatory on the unit with wheel chair self propelling.  No signs and symptoms of hypoglycemia noted.

## 2016-02-21 NOTE — Progress Notes (Signed)
Patient ID: Maureen Swanson, female   DOB: [birth year], 50 y.o.   MRN: [id number] D: Client in bed this shift only up for medications, reports "been up all day" Client reports anxiety and depression "8" of 10. Client reports goal today "help with depression and self esteem" A: Writer provided emotional support, encouraged client to continue to report needs and attend groups. Medications reviewed, administered as ordered. Staff will monitor q41min for safety. R: Client is safe on the unit, did not attend karaoke.

## 2016-02-21 NOTE — Plan of Care (Signed)
Problem: Activity: Goal: Imbalance in normal sleep/wake cycle will improve Outcome: Progressing Pt maintains a normal sleep/wake cycle; sleeps well at night

## 2016-02-22 LAB — GLUCOSE, CAPILLARY
Glucose-Capillary: 73 mg/dL (ref 65–99)
Glucose-Capillary: 127 mg/dL — ABNORMAL HIGH (ref 65–99)
Glucose-Capillary: 190 mg/dL — ABNORMAL HIGH (ref 65–99)
Glucose-Capillary: 119 mg/dL — ABNORMAL HIGH (ref 65–99)

## 2016-02-22 NOTE — Progress Notes (Signed)
Recreation Therapy Notes  Date: 02/22/16 Time: 1000 Location: 500 Hall Dayroom  Group Topic: Stress Management  Goal Area(s) Addresses:  Patient will verbalize importance of using healthy stress management.  Patient will identify positive emotions associated with healthy stress management.   Intervention: Stress Management  Activity :  Deep Breathing, Depression Imagery.  LRT introduced patients to the techniques of deep breathing and guided imagery.  LRT read scripts to allow patients to participate in the techniques.  Patients were to listen and follow along as LRT read scripts.  Education:  Stress Management, Discharge Planning.   Education Outcome: Acknowledges edcuation/In group clarification offered/Needs additional education  Clinical Observations/Feedback:  Pt did not attend group.   Victorino Sparrow, LRT/CTRS         Victorino Sparrow A 02/22/2016 11:56 AM

## 2016-02-22 NOTE — Progress Notes (Signed)
[facility] MD Progress Note  02/22/2016 11:18 AM Maureen Swanson  MRN:  [id number] Subjective:  Patient reports " I am not sure how to describe how I feel, not too good. I am still hearing voices asking me to kill myself."    Objective: Maureen Swanson is a  50 y.o. AA female , who has a hx of schizoaffective do , cocaine, alcohol, cannabis abuse, who voluntarily presents to [facility] S/P suicide attempt by OD and command AH. Maureen Swanson is well known to the [facility] unit .  Maureen Swanson today continues to have anxiety and depression and is observed as isolative often . Maureen Swanson reports AH on and off - asking her to kill self, which has been ongoing since admission. Per staff - pt often seen as withdrawn , depressed and endorses AH that is command .Maureen Swanson continues to need encouragement and support.   Principal Problem: Schizoaffective disorder, bipolar type ([place]) Diagnosis:   Patient Active Problem List   Diagnosis Date Noted  . Hypomagnesemia [E83.42]   . Cannabis use disorder, moderate, dependence ([place]) [F12.20] 11/20/2015  . Homicidal ideation [R45.850]   . Tobacco use disorder [F17.200] 07/20/2015  . Diabetes mellitus ([place]) [E11.9] 07/20/2015  . Cocaine use disorder, moderate, dependence ([place]) [F14.20] 04/03/2015  . Schizoaffective disorder, bipolar type ([place]) [F25.0] 04/03/2015  . Alcohol use disorder, moderate, dependence ([place]) [F10.20] 04/03/2015  . Acute ischemic stroke ([place]) [I63.9] 12/08/2014  . Left-sided weakness [M62.89] 12/08/2014  . 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 Knee pain [M25.569] 10/17/2010  . MAMMOGRAM, ABNORMAL, RIGHT [R92.8] 08/05/2010  . Dyslipidemia [E78.5] 06/21/2010  . AMENORRHEA, SECONDARY [N91.2] 10/29/2009  . 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 GERD [K21.9] 10/11/2007  . Essential hypertension [I10] 02/26/2007   Total Time spent with patient: 25 minutes  Past Psychiatric History: as per H&P.  Past Medical History:  Past Medical History:  Diagnosis Date  .  Anxiety   . Arthritis    back and knees  . Asthma    daily and prn inhalers  . 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 Bipolar 1 disorder ([place])   . CHF (congestive heart failure) ([place])   . Depression   . Diabetes mellitus    diet-controlled  . Gastric ulcer   . GERD (gastroesophageal reflux disease)   . Gout   . Headache(784.0)    migraines  . High cholesterol   . Hypertension    under control, has been on med. x 12 yrs.  . Substance abuse    crack cocaine  . TMJ (temporomandibular joint disorder)     Past Surgical History:  Procedure Laterality Date  . BREAST LUMPECTOMY WITH NEEDLE LOCALIZATION  04/19/2012   Procedure: BREAST LUMPECTOMY WITH NEEDLE LOCALIZATION;  Surgeon: [name], MD;  Location: [facility];  Service: General;  Laterality: Right;  . KNEE ARTHROSCOPY W/ PARTIAL MEDIAL MENISCECTOMY  05/01/2010   right   Family History:  Family History  Problem Relation Age of Onset  . Diabetes Mother   . Breast cancer Mother   . Cancer Father   . Bipolar disorder Maternal Aunt   . Schizophrenia Maternal Grandfather   . Alcoholism Maternal Uncle    Family Psychiatric  History: See Above Social History: As per H&P. History  Alcohol Use  . 0.0 oz/week    Comment: reports she drinks 3 40 ounce beers daily     History  Drug Use  . Types: Cocaine  Comment: 09/28/2015    Social History   Social History  . Marital status: Single    Spouse name: N/A  . Number of children: N/A  . Years of education: N/A   Social History Main Topics  . Smoking status: Current Every Day Smoker    Packs/day: 1.00    Years: 0.00    Types: Cigarettes  . Smokeless tobacco: Never Used  . Alcohol use 0.0 oz/week     Comment: reports she drinks 3 40 ounce beers daily  . Drug use:     Types: Cocaine     Comment: 09/28/2015  . Sexual activity: No   Other Topics Concern  . None   Social History Narrative  . None   Additional Social  History:                         Sleep: Fair   Appetite:  Fair  Current Medications: Current Facility-Administered Medications  Medication Dose Route Frequency Provider Last Rate Last Dose  . acetaminophen (TYLENOL) tablet 650 mg  650 mg Oral Q6H PRN [name], MD   650 mg at 02/20/16 1841  . alum & mag hydroxide-simeth (MAALOX/MYLANTA) 200-200-20 MG/5ML suspension 30 mL  30 mL Oral Q4H PRN Shafer Swamy, MD      . amLODipine (NORVASC) tablet 5 mg  5 mg Oral Daily Atharv Barriere, MD   5 mg at 02/22/16 1028  . ARIPiprazole ER SUSR 400 mg  400 mg Intramuscular Q28 days [name], MD   400 mg at 02/20/16 0808  . atenolol (TENORMIN) tablet 100 mg  100 mg Oral Daily Kebin Maye, MD   100 mg at 02/22/16 1030  . atorvastatin (LIPITOR) tablet 40 mg  40 mg Oral q1800 Patrycja Mumpower, MD   40 mg at 02/21/16 1827  . benztropine (COGENTIN) tablet 0.5 mg  0.5 mg Oral QHS Dorthey Depace, MD   0.5 mg at 02/21/16 2218  . divalproex (DEPAKOTE) DR tablet 250 mg  250 mg Oral Q8H Keoni Risinger, MD   250 mg at 02/22/16 0621  . gabapentin (NEURONTIN) capsule 400 mg  400 mg Oral TID [name], MD   400 mg at 02/22/16 1028  . hydrOXYzine (ATARAX/VISTARIL) tablet 25 mg  25 mg Oral TID PRN [name], MD   25 mg at 02/20/16 1843  . hydrOXYzine (ATARAX/VISTARIL) tablet 50 mg  50 mg Oral QHS,MR X 1 [name], PA-C   50 mg at 02/21/16 2218  . insulin aspart (novoLOG) injection 0-15 Units  0-15 Units Subcutaneous TID WC [name], MD   3 Units at 02/21/16 1721  . insulin aspart (novoLOG) injection 0-5 Units  0-5 Units Subcutaneous QHS Pink Maye, MD      . insulin glargine (LANTUS) injection 20 Units  20 Units Subcutaneous QHS Nishant Dhungel, MD   20 Units at 02/21/16 2216  . linagliptin (TRADJENTA) tablet 5 mg  5 mg Oral Daily Coltrane Tugwell, MD   5 mg at 02/22/16 1026  . loperamide (IMODIUM) capsule 2 mg  2 mg Oral PRN [name], MD      . loratadine (CLARITIN)  tablet 10 mg  10 mg Oral Daily Yeslin Delio, MD   10 mg at 02/22/16 1028  . magnesium hydroxide (MILK OF MAGNESIA) suspension 30 mL  30 mL Oral Daily PRN Denyce Harr, MD      . metFORMIN (GLUCOPHAGE) tablet 1,000 mg  1,000 mg Oral BID WC [name], MD  1,000 mg at 02/22/16 1028  . multivitamin with minerals tablet 1 tablet  1 tablet Oral Daily [name], MD   1 tablet at 02/22/16 1027  . nicotine (NICODERM CQ - dosed in mg/24 hours) patch 21 mg  21 mg Transdermal Daily Zyrell Carmean, MD   21 mg at 02/22/16 1030  . perphenazine (TRILAFON) tablet 2 mg  2 mg Oral BH-qamhs Tyshan Enderle, MD   2 mg at 02/22/16 1029  . thiamine (B-1) injection 100 mg  100 mg Intramuscular Once Yitzchok Carriger, MD      . thiamine (VITAMIN B-1) tablet 100 mg  100 mg Oral Daily Jya Hughston, MD   100 mg at 02/22/16 1028  . traZODone (DESYREL) tablet 100 mg  100 mg Oral QHS [name], MD   100 mg at 02/21/16 2218    Lab Results:  Results for orders placed or performed during the hospital encounter of 02/13/16 (from the past 48 hour(s))  Glucose, capillary     Status: Abnormal   Collection Time: 02/20/16 11:40 AM  Result Value Ref Range   Glucose-Capillary 111 (H) 65 - 99 mg/dL   Comment 1 Notify RN    Comment 2 Document in Chart   Glucose, capillary     Status: Abnormal   Collection Time: 02/20/16  5:16 PM  Result Value Ref Range   Glucose-Capillary 142 (H) 65 - 99 mg/dL  Glucose, capillary     Status: Abnormal   Collection Time: 02/20/16  8:34 PM  Result Value Ref Range   Glucose-Capillary 132 (H) 65 - 99 mg/dL  Glucose, capillary     Status: None   Collection Time: 02/21/16  6:31 AM  Result Value Ref Range   Glucose-Capillary 77 65 - 99 mg/dL  Basic metabolic panel     Status: None   Collection Time: 02/21/16  6:41 AM  Result Value Ref Range   Sodium 143 135 - 145 mmol/L   Potassium 3.9 3.5 - 5.1 mmol/L   Chloride 109 101 - 111 mmol/L   CO2 29 22 - 32 mmol/L   Glucose, Bld 70 65  - 99 mg/dL   BUN 13 6 - 20 mg/dL   Creatinine, Ser 0.90 0.44 - 1.00 mg/dL   Calcium 9.4 8.9 - 10.3 mg/dL   GFR calc non Af Amer >60 >60 mL/min   GFR calc Af Amer >60 >60 mL/min    Comment: (NOTE) The eGFR has been calculated using the CKD EPI equation. This calculation has not been validated in all clinical situations. eGFR's persistently <60 mL/min signify possible Chronic Kidney Disease.    Anion gap 5 5 - 15    Comment: Performed at [facility]  Magnesium     Status: None   Collection Time: 02/21/16  6:41 AM  Result Value Ref Range   Magnesium 1.9 1.7 - 2.4 mg/dL    Comment: Performed at [facility]  Phosphorus     Status: None   Collection Time: 02/21/16  6:41 AM  Result Value Ref Range   Phosphorus 3.8 2.5 - 4.6 mg/dL    Comment: Performed at [facility]  Glucose, capillary     Status: Abnormal   Collection Time: 02/21/16 11:41 AM  Result Value Ref Range   Glucose-Capillary 120 (H) 65 - 99 mg/dL   Comment 1 Notify RN    Comment 2 Document in Chart   Glucose, capillary     Status: Abnormal   Collection Time:  02/21/16  4:38 PM  Result Value Ref Range   Glucose-Capillary 157 (H) 65 - 99 mg/dL   Comment 1 Notify RN    Comment 2 Document in Chart   Glucose, capillary     Status: Abnormal   Collection Time: 02/21/16  8:08 PM  Result Value Ref Range   Glucose-Capillary 105 (H) 65 - 99 mg/dL  Glucose, capillary     Status: None   Collection Time: 02/22/16  6:08 AM  Result Value Ref Range   Glucose-Capillary 73 65 - 99 mg/dL    Blood Alcohol level:  Lab Results  Component Value Date   ETH 6 (H) 02/13/2016   ETH <5 11/18/2015    Physical Findings: AIMS: Facial and Oral Movements Muscles of Facial Expression: None, normal Lips and Perioral Area: None, normal Jaw: None, normal Tongue: None, normal,Extremity Movements Upper (arms, wrists, hands, fingers): None, normal Lower (legs, knees, ankles, toes): None,  normal, Trunk Movements Neck, shoulders, hips: None, normal, Overall Severity Severity of abnormal movements (highest score from questions above): None, normal Incapacitation due to abnormal movements: None, normal Patient's awareness of abnormal movements (rate only patient's report): No Awareness, Dental Status Current problems with teeth and/or dentures?: No Does patient usually wear dentures?: No  CIWA:  CIWA-Ar Total: 1 COWS:     Musculoskeletal: Strength & Muscle Tone: within normal limits Gait & Station: normal Patient leans: N/A  Psychiatric Specialty Exam: Review of Systems  Gastrointestinal: Negative for diarrhea.  Musculoskeletal: Positive for myalgias.  Psychiatric/Behavioral: Positive for depression, hallucinations and substance abuse. The patient is nervous/anxious.   All other systems reviewed and are negative.   Blood pressure (!) 137/102, pulse 80, temperature 99 F (37.2 C), temperature source Oral, resp. rate 18, height _0  (1.6 m), weight 95.7 kg (211 lb).Body mass index is 37.38 kg/m.  General Appearance: Casual  Eye Contact::  Fair  Speech:  Clear and Coherent  Volume:  Normal  Mood:  Anxious and Dysphoric   Affect:  Depressed  Thought Process:  Goal Directed and Descriptions of Associations: Circumstantial  Orientation:  Full (Time, Place, and Person)  Thought Content:  Hallucinations: Auditory on and off - command   Suicidal Thoughts:  has it on and off , denies plan  has command AH asking her to kill self.  Homicidal Thoughts:  No  Memory:  Immediate;   Fair Recent;   Fair Remote;   Fair  Judgement:  Fair  Insight:  Shallow  Psychomotor Activity:  Restlessness  Concentration:  Fair  Recall:  [employer] of Knowledge:Fair  Language: Fair  Akathisia:  No  Handed:  Right  AIMS (if indicated):     Assets:  Communication Skills Desire for Improvement Resilience  ADL's:  Intact  Cognition: WNL  Sleep:  Number of Hours: 6.75     Treatment  Plan Summary:Maureen Swanson is a  50 y.o.AA female , who has a hx of schizoaffective do , cocaine, alcohol, cannabis abuse, who voluntarily presents to [facility] s/p suicide attempt , continues to be depressed, although progressing. Pt today reports diarrhea as improving , will restart depakote and prozac  .  Will continue treatment.  Daily contact with patient to assess and evaluate symptoms and progress in treatment and Medication management  Will continue CIWA/Librium protocol for alcohol withdrawal sx. Will continue Abilify Maintena IM 400 mg - last dose per pt was on august 10 th - repeat q28 days- last received IM on  02/20/16. Will continue Gabapentin  400 mg po tid for mood lability/anxiety/pain. Will continue Trazodone 100 mg po qhs for sleep. Will continue prozac 40 mg po daily for affective sx. Will continue Depakote dr 250 mg po q8h for mood sx- depakote level on 02/19/16- 60 ug/ml ( therapeutic) . Will continue Trilafon 2 mg po bid for psychosis - to augment the abilify maintena IM . Pt with multiple medical issues - please Hospitalist /diabetic consult notes. Restarted home medications as needed. Will continue to monitor vitals ,medication compliance and treatment side effects while patient is here.  Will monitor for medical issues as well as call consult as needed.  CSW will continue  working on disposition. Patient referred to a substance abuse treatment program- pending placement. Patient to participate in therapeutic milieu    Kennya Schwenn, MD 02/22/2016, 11:18 AM

## 2016-02-22 NOTE — Progress Notes (Signed)
Adult Psychoeducational Group Note  Date:  02/22/2016 Time:  9:10 PM  Group Topic/Focus:  Wrap-Up Group:   The focus of this group is to help patients review their daily goal of treatment and discuss progress on daily workbooks.   Participation Level:  Minimal  Participation Quality:  Appropriate  Affect:  Flat  Cognitive:  Oriented  Insight: Appropriate  Engagement in Group:  Engaged  Modes of Intervention:  Socialization and Support  Additional Comments:  Patient attended and participated in group tonight. She reports that her day was a 8. She missed her first group due to her sleeping. She went to the other groups today. She is scheduled to be discharged on Monday. The act team is working on plans for after her discharge.  Salley Scarlet [facility] 02/22/2016, 9:10 PM

## 2016-02-22 NOTE — B[facility] Group Notes (Signed)
[place] LCSW Group Therapy  02/22/2016  1:05 PM  Type of Therapy:  Group therapy  Participation Level:  Active  Participation Quality:  Attentive  Affect:  Flat  Cognitive:  Oriented  Insight:  Limited  Engagement in Therapy:  Limited  Modes of Intervention:  Discussion, Socialization  Summary of Progress/Problems:  Chaplain was here to lead a group on themes of hope and courage.  "I have some hope today.  I'm hoping things work out for the best when I leave.  Talking to the staff has helped, as well as the Dr."  Cited her 4 grandaughters as her main provider of hope.  [name] 02/22/2016 1:20 PM

## 2016-02-22 NOTE — Progress Notes (Signed)
Data. Patient continues to report "voices" telling her to, "Hurt others". When questioned as to who the voices were telling her to hurt, she reported, "Anyone". Patient did not get up for her morning medications, even with prompting, until 10:30am. Patient's affect is blunt and her mood is anxious and depresses. She has been isolating in her room much of the day, except for meals. Patient denies VH. Patient interacting appropriately with staff and other patients, when she does interact. Continues to use W/C for mobility R/T generalized weakness and BLE, mostly feet, pain. Pain medication given for BLE pain. On her self assessment, patient reports, 8/10 for depression, anxiety and hopelessness. Her goal for today is: "Trying to talk to staff about my goals and self esteem." Action. Emotional support and encouragement offered. Education provided on medication, indications and side effect. Q 15 minute checks done for safety. Response. Patient reports only minimal relief from pain with medication. Heat applied to BLE and feet elevated. Safety on the unit maintained through 15 minute checks.  Medications taken as prescribed. Attended groups. Remained calm and appropriate through out shift.

## 2016-02-22 NOTE — Plan of Care (Signed)
Problem: Safety: Goal: Periods of time without injury will increase Outcome: Progressing Patient has been able to stay safe on the unit and she has been able contract to come to staff is she feels overwhelmed, before acting on any thoughts/feelings/command voices.

## 2016-02-23 LAB — GLUCOSE, CAPILLARY
Glucose-Capillary: 92 mg/dL (ref 65–99)
Glucose-Capillary: 111 mg/dL — ABNORMAL HIGH (ref 65–99)
Glucose-Capillary: 140 mg/dL — ABNORMAL HIGH (ref 65–99)
Glucose-Capillary: 126 mg/dL — ABNORMAL HIGH (ref 65–99)

## 2016-02-23 NOTE — Progress Notes (Signed)
DAR NOTE: Patient presents with anxious affect and depressed mood.  Denies  auditory and visual hallucinations.  Rates depression at 8, hopelessness at 8, and anxiety at 8.  Described energy level as low and concentration as poor.  Maintained on routine safety checks.  Medications given as prescribed.  Support and encouragement offered as needed.  States goal for today is "self esteem and depression."  Patient observed socializing with peers in the dayroom.  Offered no complaint.  No symptoms of hypoglycemia noted.

## 2016-02-23 NOTE — Progress Notes (Signed)
Adult Psychoeducational Group Note  Date:  02/23/2016 Time:  10:48 AM  Group Topic/Focus:  Building Self Esteem:   The Focus of this group is helping patients become aware of the effects of self-esteem on their lives, the things they and others do that enhance or undermine their self-esteem, seeing the relationship between their level of self-esteem and the choices they make and learning ways to enhance self-esteem. Making Healthy Choices:   The focus of this group is to help patients identify negative/unhealthy choices they were using prior to admission and identify positive/healthier coping strategies to replace them upon discharge.    Participation Level:  Did Not Attend  Hedy Jacob 02/23/2016, 10:48 AM

## 2016-02-23 NOTE — Progress Notes (Signed)
DAngela Nevin had been up and visible in milieu this evening, did attend and participate in evening group activity. Fauna spoke about feeling somewhat better but still endorsed some feelings of depression. Verneice spoke about how she is suppose to be discharged on Monday and spoke about how she feels she will be ready. Kirandeep received all bedtime medications without incident. A. Support and encouragement provided. R. Safety maintained, will continue to monitor.

## 2016-02-23 NOTE — Progress Notes (Signed)
Adult Psychoeducational Group Note  Date:  02/23/2016 Time:  9:07 PM  Group Topic/Focus:  Wrap-Up Group:   The focus of this group is to help patients review their daily goal of treatment and discuss progress on daily workbooks.   Participation Level:  Active  Participation Quality:  Appropriate  Affect:  Appropriate  Cognitive:  Appropriate  Insight: Appropriate  Engagement in Group:  Engaged  Modes of Intervention:  Discussion  Additional Comments: The patient expressed that her day was ok.The patient also said that she attended groups. Nash Shearer 02/23/2016, 9:07 PM

## 2016-02-23 NOTE — Progress Notes (Signed)
Patient ID: Maureen Swanson, female   DOB: Jan 18, 1966, 50 y.o.   MRN: [id number] [facility] MD Progress Note  02/23/2016 3:30 PM Maureen Swanson  MRN:  [id number]   Subjective:  Patient was not able to communicate with me today due to been sedated and sleeping on her bed without distress.   Objective: Maureen Swanson is a  50 y.o. AA female with hx of schizoaffective do , cocaine, alcohol, cannabis abuse, who voluntarily admitted for suicide attempt by OD and command [place]. Maureen Swanson is well known to the [facility] unit .   Maureen Swanson today continues to sleep extensive ours in her bed except few hours probable medication and needed the rest so we have not woke up her up.   Per staff - pt often seen as withdrawn , depressed and endorses AH that is command .Maureen Swanson continues to need encouragement and support.   Principal Problem: Schizoaffective disorder, bipolar type ([place]) Diagnosis:   Patient Active Problem List   Diagnosis Date Noted  . Hypomagnesemia [E83.42]   . Cannabis use disorder, moderate, dependence ([place]) [F12.20] 11/20/2015  . Homicidal ideation [R45.850]   . Tobacco use disorder [F17.200] 07/20/2015  . Diabetes mellitus ([place]) [E11.9] 07/20/2015  . Cocaine use disorder, moderate, dependence ([place]) [F14.20] 04/03/2015  . Schizoaffective disorder, bipolar type ([place]) [F25.0] 04/03/2015  . Alcohol use disorder, moderate, dependence ([place]) [F10.20] 04/03/2015  . Acute ischemic stroke ([place]) [I63.9] 12/08/2014  . Left-sided weakness [M62.89] 12/08/2014  . 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 Knee pain [M25.569] 10/17/2010  . MAMMOGRAM, ABNORMAL, RIGHT [R92.8] 08/05/2010  . Dyslipidemia [E78.5] 06/21/2010  . AMENORRHEA, SECONDARY [N91.2] 10/29/2009  . 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 GERD [K21.9] 10/11/2007  . Essential hypertension [I10] 02/26/2007   Total Time spent with patient: 25 minutes  Past Psychiatric History: as per H&P.  Past Medical History:  Past Medical History:  Diagnosis  Date  . Anxiety   . Arthritis    back and knees  . Asthma    daily and prn inhalers  . 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 Bipolar 1 disorder ([place])   . CHF (congestive heart failure) ([place])   . Depression   . Diabetes mellitus    diet-controlled  . Gastric ulcer   . GERD (gastroesophageal reflux disease)   . Gout   . Headache(784.0)    migraines  . High cholesterol   . Hypertension    under control, has been on med. x 12 yrs.  . Substance abuse    crack cocaine  . TMJ (temporomandibular joint disorder)     Past Surgical History:  Procedure Laterality Date  . BREAST LUMPECTOMY WITH NEEDLE LOCALIZATION  04/19/2012   Procedure: BREAST LUMPECTOMY WITH NEEDLE LOCALIZATION;  Surgeon: [name], MD;  Location: [facility];  Service: General;  Laterality: Right;  . KNEE ARTHROSCOPY W/ PARTIAL MEDIAL MENISCECTOMY  05/01/2010   right   Family History:  Family History  Problem Relation Age of Onset  . Diabetes Mother   . Breast cancer Mother   . Cancer Father   . Bipolar disorder Maternal Aunt   . Schizophrenia Maternal Grandfather   . Alcoholism Maternal Uncle    Family Psychiatric  History: See Above Social History: As per H&P. History  Alcohol Use  . 0.0 oz/week    Comment: reports she drinks 3 40 ounce beers daily     History  Drug Use  . Types:  Cocaine    Comment: 09/28/2015    Social History   Social History  . Marital status: Single    Spouse name: N/A  . Number of children: N/A  . Years of education: N/A   Social History Main Topics  . Smoking status: Current Every Day Smoker    Packs/day: 1.00    Years: 0.00    Types: Cigarettes  . Smokeless tobacco: Never Used  . Alcohol use 0.0 oz/week     Comment: reports she drinks 3 40 ounce beers daily  . Drug use:     Types: Cocaine     Comment: 09/28/2015  . Sexual activity: No   Other Topics Concern  . None   Social History Narrative  . None   Additional  Social History:                         Sleep: Fair   Appetite:  Fair  Current Medications: Current Facility-Administered Medications  Medication Dose Route Frequency Provider Last Rate Last Dose  . acetaminophen (TYLENOL) tablet 650 mg  650 mg Oral Q6H PRN [name], MD   650 mg at 02/22/16 1429  . alum & mag hydroxide-simeth (MAALOX/MYLANTA) 200-200-20 MG/5ML suspension 30 mL  30 mL Oral Q4H PRN Saramma Eappen, MD      . amLODipine (NORVASC) tablet 5 mg  5 mg Oral Daily [name], MD   5 mg at 02/23/16 0902  . ARIPiprazole ER SUSR 400 mg  400 mg Intramuscular Q28 days [name], MD   400 mg at 02/20/16 0808  . atenolol (TENORMIN) tablet 100 mg  100 mg Oral Daily [name], MD   100 mg at 02/23/16 0901  . atorvastatin (LIPITOR) tablet 40 mg  40 mg Oral q1800 Saramma Eappen, MD   40 mg at 02/22/16 1710  . benztropine (COGENTIN) tablet 0.5 mg  0.5 mg Oral QHS [name], MD   0.5 mg at 02/22/16 2055  . divalproex (DEPAKOTE) DR tablet 250 mg  250 mg Oral Q8H Saramma Eappen, MD   250 mg at 02/23/16 0614  . gabapentin (NEURONTIN) capsule 400 mg  400 mg Oral TID [name], MD   400 mg at 02/23/16 0902  . hydrOXYzine (ATARAX/VISTARIL) tablet 25 mg  25 mg Oral TID PRN [name], MD   25 mg at 02/22/16 1430  . hydrOXYzine (ATARAX/VISTARIL) tablet 50 mg  50 mg Oral QHS,MR X 1 [name], PA-C   50 mg at 02/22/16 2056  . insulin aspart (novoLOG) injection 0-15 Units  0-15 Units Subcutaneous TID WC [name], MD   0 Units at 02/23/16 1220  . insulin aspart (novoLOG) injection 0-5 Units  0-5 Units Subcutaneous QHS Saramma Eappen, MD      . insulin glargine (LANTUS) injection 20 Units  20 Units Subcutaneous QHS Nishant Dhungel, MD   20 Units at 02/22/16 2059  . linagliptin (TRADJENTA) tablet 5 mg  5 mg Oral Daily [name], MD   5 mg at 02/23/16 0901  . loperamide (IMODIUM) capsule 2 mg  2 mg Oral PRN [name], MD      . loratadine  (CLARITIN) tablet 10 mg  10 mg Oral Daily [name], MD   10 mg at 02/23/16 0902  . magnesium hydroxide (MILK OF MAGNESIA) suspension 30 mL  30 mL Oral Daily PRN Saramma Eappen, MD      . metFORMIN (GLUCOPHAGE) tablet 1,000 mg  1,000 mg Oral BID  WC Saramma Eappen, MD   1,000 mg at 02/23/16 0900  . multivitamin with minerals tablet 1 tablet  1 tablet Oral Daily [name], MD   1 tablet at 02/23/16 0901  . nicotine (NICODERM CQ - dosed in mg/24 hours) patch 21 mg  21 mg Transdermal Daily [name], MD   21 mg at 02/23/16 0903  . perphenazine (TRILAFON) tablet 2 mg  2 mg Oral BH-qamhs Saramma Eappen, MD   2 mg at 02/23/16 0901  . thiamine (B-1) injection 100 mg  100 mg Intramuscular Once Saramma Eappen, MD      . thiamine (VITAMIN B-1) tablet 100 mg  100 mg Oral Daily Saramma Eappen, MD   100 mg at 02/23/16 0902  . traZODone (DESYREL) tablet 100 mg  100 mg Oral QHS [name], MD   100 mg at 02/22/16 2058    Lab Results:  Results for orders placed or performed during the hospital encounter of 02/13/16 (from the past 48 hour(s))  Glucose, capillary     Status: Abnormal   Collection Time: 02/21/16  4:38 PM  Result Value Ref Range   Glucose-Capillary 157 (H) 65 - 99 mg/dL   Comment 1 Notify RN    Comment 2 Document in Chart   Glucose, capillary     Status: Abnormal   Collection Time: 02/21/16  8:08 PM  Result Value Ref Range   Glucose-Capillary 105 (H) 65 - 99 mg/dL  Glucose, capillary     Status: None   Collection Time: 02/22/16  6:08 AM  Result Value Ref Range   Glucose-Capillary 73 65 - 99 mg/dL  Glucose, capillary     Status: Abnormal   Collection Time: 02/22/16 11:58 AM  Result Value Ref Range   Glucose-Capillary 127 (H) 65 - 99 mg/dL  Glucose, capillary     Status: Abnormal   Collection Time: 02/22/16  4:52 PM  Result Value Ref Range   Glucose-Capillary 190 (H) 65 - 99 mg/dL  Glucose, capillary     Status: Abnormal   Collection Time: 02/22/16  8:37 PM  Result  Value Ref Range   Glucose-Capillary 119 (H) 65 - 99 mg/dL   Comment 1 Notify RN   Glucose, capillary     Status: None   Collection Time: 02/23/16  6:12 AM  Result Value Ref Range   Glucose-Capillary 92 65 - 99 mg/dL  Glucose, capillary     Status: Abnormal   Collection Time: 02/23/16 11:59 AM  Result Value Ref Range   Glucose-Capillary 111 (H) 65 - 99 mg/dL    Blood Alcohol level:  Lab Results  Component Value Date   ETH 6 (H) 02/13/2016   ETH <5 11/18/2015    Physical Findings: AIMS: Facial and Oral Movements Muscles of Facial Expression: None, normal Lips and Perioral Area: None, normal Jaw: None, normal Tongue: None, normal,Extremity Movements Upper (arms, wrists, hands, fingers): None, normal Lower (legs, knees, ankles, toes): None, normal, Trunk Movements Neck, shoulders, hips: None, normal, Overall Severity Severity of abnormal movements (highest score from questions above): None, normal Incapacitation due to abnormal movements: None, normal Patient's awareness of abnormal movements (rate only patient's report): No Awareness, Dental Status Current problems with teeth and/or dentures?: No Does patient usually wear dentures?: No  CIWA:  CIWA-Ar Total: 1 COWS:     Musculoskeletal: Strength & Muscle Tone: within normal limits Gait & Station: normal Patient leans: N/A  Psychiatric Specialty Exam: Review of Systems  Gastrointestinal: Negative for diarrhea.  Musculoskeletal: Positive for  myalgias.  Psychiatric/Behavioral: Positive for depression, hallucinations and substance abuse. The patient is nervous/anxious.   All other systems reviewed and are negative.   Blood pressure 139/86, pulse 88, temperature 98.7 F (37.1 C), temperature source Oral, resp. rate 18, height 5\' 3"  (1.6 m), weight 95.7 kg (211 lb).Body mass index is 37.38 kg/m.  General Appearance: Casual  Eye Contact::  Fair  Speech:  Clear and Coherent  Volume:  Normal  Mood:  Anxious and Dysphoric    Affect:  Depressed  Thought Process:  Goal Directed and Descriptions of Associations: Circumstantial  Orientation:  Full (Time, Place, and Person)  Thought Content:  Hallucinations: Auditory on and off - command   Suicidal Thoughts:  has it on and off , denies plan  has command AH asking her to kill self.  Homicidal Thoughts:  No  Memory:  Immediate;   Fair Recent;   Fair Remote;   Fair  Judgement:  Fair  Insight:  Shallow  Psychomotor Activity:  Restlessness  Concentration:  Fair  Recall:  [employer] of Knowledge:Fair  Language: Fair  Akathisia:  No  Handed:  Right  AIMS (if indicated):     Assets:  Communication Skills Desire for Improvement Resilience  ADL's:  Intact  Cognition: WNL  Sleep:  Number of Hours: 6.25     Treatment Plan Summary:Maureen Swanson is a  50 y.o.AA female , who has a hx of schizoaffective do , cocaine, alcohol, cannabis abuse, who voluntarily presents to [facility] s/p suicide attempt , continues to be depressed, although progressing. Pt today reports diarrhea as improving , will restart depakote and prozac  .  Will continue treatment.  Daily contact with patient to assess and evaluate symptoms and progress in treatment and Medication management   Continue current treatment plan without changes and monitor for withdrawal symptoms and clinical Improvements.  Will continue CIWA/Librium protocol for alcohol withdrawal sx. Will continue Abilify Maintena IM 400 mg - last dose per pt was on august 10 th - repeat q28 days- last received IM on  02/20/16. Will continue Gabapentin 400 mg po tid for mood lability/anxiety/pain. Will continue Trazodone 100 mg po qhs for sleep. Will continue prozac 40 mg po daily for affective sx. Will continue Depakote dr 250 mg po q8h for mood sx- depakote level on 02/19/16- 60 ug/ml ( therapeutic) . Will continue Trilafon 2 mg po bid for psychosis - to augment the abilify maintena IM . Pt with multiple medical issues - please Hospitalist  /diabetic consult notes. Restarted home medications as needed. Will continue to monitor vitals ,medication compliance and treatment side effects while patient is here.  Will monitor for medical issues as well as call consult as needed.  CSW will continue  working on disposition. Patient referred to a substance abuse treatment program- pending placement. Patient to participate in therapeutic milieu    Maureen Finland, MD 02/23/2016, 3:30 PM

## 2016-02-23 NOTE — BHH Group Notes (Signed)
[facility] Group Notes:  (Clinical Social Work)  02/23/2016  11:15-12:00PM  Summary of Progress/Problems:   Today's process group involved patients discussing their feelings related to being hospitalized, as well as how they can use their present feelings to create a plan for staying out of the hospital in the future. The patient expressed nothing during group, had her wheelchair facing outward to the courtyard.  Type of Therapy:  Group Therapy - Process  Participation Level:  Minimal  Participation Quality:  Attentive  Affect:  Blunted and Depressed  Cognitive:  n/a  Insight:  n/a  Engagement in Therapy:  None  Modes of Intervention:  Exploration, Discussion  Selmer Dominion, LCSW 02/23/2016, 12:20 PM

## 2016-02-23 NOTE — Progress Notes (Signed)
Maureen Swanson had been up and visible in milieu this evening, did attend group activity. Maureen Swanson has appeared depressed and withdrawn this evening and minimal interaction with staff or peers. Maureen Swanson spoke about how her day was ok and when asked about discharge spoke about how she thinks she feels ready for Monday discharge. Maureen Swanson has denied SI this evening and able to contract for safety. Maureen Swanson received all bedtime medications without incident. A. Support and encouragement provided. R. Safety maintained, will continue to monitor.

## 2016-02-24 LAB — GLUCOSE, CAPILLARY
GLUCOSE-CAPILLARY: 124 mg/dL — AB (ref 65–99)
Glucose-Capillary: 84 mg/dL (ref 65–99)
Glucose-Capillary: 155 mg/dL — ABNORMAL HIGH (ref 65–99)
Glucose-Capillary: 117 mg/dL — ABNORMAL HIGH (ref 65–99)

## 2016-02-24 MED ORDER — TRAZODONE HCL 150 MG PO TABS
150.0000 mg | ORAL_TABLET | Freq: Every day | ORAL | Status: DC
  Administered 2016-02-24: 150 mg via ORAL
  Filled 2016-02-24: qty 7
  Filled 2016-02-24 (×2): qty 1
  Filled 2016-02-24: qty 7

## 2016-02-24 NOTE — Progress Notes (Signed)
DAR NOTE: Patient presents with depressed mood and affect. Denies auditory and visual hallucinations.  Reports suicidal thoughts on self inventory form but contracts for safety during assessment.  Rates depression at 8, hopelessness at 8, and anxiety at 8.  Maintained on routine safety checks.  Medications given as prescribed.  Support and encouragement offered as needed.  States goal for today is "getting my life on the right road so I won't feel depressed all the time."  Minimal interaction with staff and peers.  Patient isolative to her room most of this shift.  No symptoms of hypoglycemic reaction noted.

## 2016-02-24 NOTE — BHH Group Notes (Signed)
[place] Group Notes:  (Clinical Social Work)  02/24/2016  11:00AM-12:00PM  Summary of Progress/Problems:  The main focus of today's process group was to listen to a variety of genres of music and to identify that different types of music provoke different responses.  The patient then was able to identify personally what was soothing for them, as well as energizing, as well as how patient can personally use this knowledge in sleep habits, with depression, and with other symptoms.  The patient expressed at the beginning of group the overall feeling of "calm and looking forward to discharge."  She sang a little, but was mostly blunted throughout the songs.  Type of Therapy:  Music Therapy   Participation Level:  Active  Participation Quality:  Attentive   Affect:  Blunted  Cognitive:  Oriented  Insight:  Engaged  Engagement in Therapy:  Engaged  Modes of Intervention:   Activity, Exploration  Selmer Dominion, LCSW 02/24/2016

## 2016-02-24 NOTE — Progress Notes (Signed)
Patient ID: Maureen Swanson, female   DOB: 1966/06/04, 50 y.o.   MRN: [id number] Patient ID: Maureen Swanson, female   DOB: October 06, 1965, 50 y.o.   MRN: [id number] [facility] MD Progress Note  02/24/2016 1:34 PM Maureen Swanson  MRN:  [id number]   Subjective:  "sleep has been disturbed and asking to adjust her medication for better sleep".    Objective: Maureen Swanson is a  50 y.o. AA female with hx of schizoaffective do , cocaine, alcohol, cannabis abuse, who voluntarily admitted for suicide attempt by OD and command Maureen Swanson. Maureen Swanson is well known to the [facility] unit .   Maureen Swanson is calm and cooperative today. She has been in her bed and stated that she did not sleep well because of frequent awakening and not refreshing and asking to increase her sleep medication dosage. She reportedly participated about two group activities yesterday and has plans to participate in her group and milieu activities today. She denied hallucinations and delusions reportedly her medication seems to be working much better. She has been tolerating her medication and has no reported GI disturbance. She has minimizes her suicide or homicide ideation, intention or plans and contract for safety while in hospital.   Per staff - pt often seen as withdrawn , depressed and Maureen Swanson continues to need encouragement and support.   Principal Problem: Schizoaffective disorder, bipolar type ([place]) Diagnosis:   Patient Active Problem List   Diagnosis Date Noted  . Hypomagnesemia [E83.42]   . Cannabis use disorder, moderate, dependence ([place]) [F12.20] 11/20/2015  . Homicidal ideation [R45.850]   . Tobacco use disorder [F17.200] 07/20/2015  . Diabetes mellitus ([place]) [E11.9] 07/20/2015  . Cocaine use disorder, moderate, dependence ([place]) [F14.20] 04/03/2015  . Schizoaffective disorder, bipolar type ([place]) [F25.0] 04/03/2015  . Alcohol use disorder, moderate, dependence ([place]) [F10.20] 04/03/2015  . Acute ischemic stroke ([place]) [I63.9] 12/08/2014  . Left-sided weakness  [M62.89] 12/08/2014  . 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 Knee pain [M25.569] 10/17/2010  . MAMMOGRAM, ABNORMAL, RIGHT [R92.8] 08/05/2010  . Dyslipidemia [E78.5] 06/21/2010  . AMENORRHEA, SECONDARY [N91.2] 10/29/2009  . 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 GERD [K21.9] 10/11/2007  . Essential hypertension [I10] 02/26/2007   Total Time spent with patient: 25 minutes  Past Psychiatric History: as per H&P.  Past Medical History:  Past Medical History:  Diagnosis Date  . Anxiety   . Arthritis    back and knees  . Asthma    daily and prn inhalers  . 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 Bipolar 1 disorder ([place])   . CHF (congestive heart failure) ([place])   . Depression   . Diabetes mellitus    diet-controlled  . Gastric ulcer   . GERD (gastroesophageal reflux disease)   . Gout   . Headache(784.0)    migraines  . High cholesterol   . Hypertension    under control, has been on med. x 12 yrs.  . Substance abuse    crack cocaine  . TMJ (temporomandibular joint disorder)     Past Surgical History:  Procedure Laterality Date  . BREAST LUMPECTOMY WITH NEEDLE LOCALIZATION  04/19/2012   Procedure: BREAST LUMPECTOMY WITH NEEDLE LOCALIZATION;  Surgeon: [name], MD;  Location: [place];  Service: General;  Laterality: Right;  . KNEE ARTHROSCOPY W/ PARTIAL MEDIAL MENISCECTOMY  05/01/2010   right   Family History:  Family History  Problem Relation Age of Onset  .  Diabetes Mother   . Breast cancer Mother   . Cancer Father   . Bipolar disorder Maternal Aunt   . Schizophrenia Maternal Grandfather   . Alcoholism Maternal Uncle    Family Psychiatric  History: See Above Social History: As per H&P. History  Alcohol Use  . 0.0 oz/week    Comment: reports she drinks 3 40 ounce beers daily     History  Drug Use  . Types: Cocaine    Comment: 09/28/2015    Social History   Social History  . Marital status: Single     Spouse name: N/A  . Number of children: N/A  . Years of education: N/A   Social History Main Topics  . Smoking status: Current Every Day Smoker    Packs/day: 1.00    Years: 0.00    Types: Cigarettes  . Smokeless tobacco: Never Used  . Alcohol use 0.0 oz/week     Comment: reports she drinks 3 40 ounce beers daily  . Drug use:     Types: Cocaine     Comment: 09/28/2015  . Sexual activity: No   Other Topics Concern  . None   Social History Narrative  . None   Additional Social History:        Sleep: Fair   Appetite:  Fair  Current Medications: Current Facility-Administered Medications  Medication Dose Route Frequency Provider Last Rate Last Dose  . acetaminophen (TYLENOL) tablet 650 mg  650 mg Oral Q6H PRN [name], MD   650 mg at 02/22/16 1429  . alum & mag hydroxide-simeth (MAALOX/MYLANTA) 200-200-20 MG/5ML suspension 30 mL  30 mL Oral Q4H PRN Saramma Eappen, MD      . amLODipine (NORVASC) tablet 5 mg  5 mg Oral Daily [name], MD   5 mg at 02/24/16 0831  . ARIPiprazole ER SUSR 400 mg  400 mg Intramuscular Q28 days [name], MD   400 mg at 02/20/16 0808  . atenolol (TENORMIN) tablet 100 mg  100 mg Oral Daily [name], MD   100 mg at 02/24/16 0832  . atorvastatin (LIPITOR) tablet 40 mg  40 mg Oral q1800 [name], MD   40 mg at 02/23/16 1657  . benztropine (COGENTIN) tablet 0.5 mg  0.5 mg Oral QHS [name], MD   0.5 mg at 02/23/16 2101  . divalproex (DEPAKOTE) DR tablet 250 mg  250 mg Oral Q8H Saramma Eappen, MD   250 mg at 02/23/16 2101  . gabapentin (NEURONTIN) capsule 400 mg  400 mg Oral TID [name], MD   400 mg at 02/24/16 1209  . hydrOXYzine (ATARAX/VISTARIL) tablet 25 mg  25 mg Oral TID PRN [name], MD   25 mg at 02/22/16 1430  . hydrOXYzine (ATARAX/VISTARIL) tablet 50 mg  50 mg Oral QHS,MR X 1 Spencer E Simon, PA-C   50 mg at 02/23/16 2101  . insulin aspart (novoLOG) injection 0-15 Units  0-15 Units Subcutaneous TID  WC [name], MD   2 Units at 02/24/16 1210  . insulin aspart (novoLOG) injection 0-5 Units  0-5 Units Subcutaneous QHS Saramma Eappen, MD      . insulin glargine (LANTUS) injection 20 Units  20 Units Subcutaneous QHS Nishant Dhungel, MD   20 Units at 02/23/16 2101  . linagliptin (TRADJENTA) tablet 5 mg  5 mg Oral Daily [name], MD   5 mg at 02/24/16 0832  . loperamide (IMODIUM) capsule 2 mg  2 mg Oral PRN [name], MD      .  loratadine (CLARITIN) tablet 10 mg  10 mg Oral Daily [name], MD   10 mg at 02/24/16 0833  . magnesium hydroxide (MILK OF MAGNESIA) suspension 30 mL  30 mL Oral Daily PRN [name], MD      . metFORMIN (GLUCOPHAGE) tablet 1,000 mg  1,000 mg Oral BID WC [name], MD   1,000 mg at 02/24/16 0833  . multivitamin with minerals tablet 1 tablet  1 tablet Oral Daily [name], MD   1 tablet at 02/24/16 0832  . nicotine (NICODERM CQ - dosed in mg/24 hours) patch 21 mg  21 mg Transdermal Daily [name], MD   21 mg at 02/24/16 0833  . perphenazine (TRILAFON) tablet 2 mg  2 mg Oral BH-qamhs [name], MD   2 mg at 02/24/16 0832  . thiamine (B-1) injection 100 mg  100 mg Intramuscular Once Saramma Eappen, MD      . thiamine (VITAMIN B-1) tablet 100 mg  100 mg Oral Daily [name], MD   100 mg at 02/24/16 0832  . traZODone (DESYREL) tablet 100 mg  100 mg Oral QHS [name], MD   100 mg at 02/23/16 2101    Lab Results:  Results for orders placed or performed during the hospital encounter of 02/13/16 (from the past 48 hour(s))  Glucose, capillary     Status: Abnormal   Collection Time: 02/22/16  4:52 PM  Result Value Ref Range   Glucose-Capillary 190 (H) 65 - 99 mg/dL  Glucose, capillary     Status: Abnormal   Collection Time: 02/22/16  8:37 PM  Result Value Ref Range   Glucose-Capillary 119 (H) 65 - 99 mg/dL   Comment 1 Notify RN   Glucose, capillary     Status: None   Collection Time: 02/23/16  6:12 AM  Result Value  Ref Range   Glucose-Capillary 92 65 - 99 mg/dL  Glucose, capillary     Status: Abnormal   Collection Time: 02/23/16 11:59 AM  Result Value Ref Range   Glucose-Capillary 111 (H) 65 - 99 mg/dL  Glucose, capillary     Status: Abnormal   Collection Time: 02/23/16  4:54 PM  Result Value Ref Range   Glucose-Capillary 140 (H) 65 - 99 mg/dL  Glucose, capillary     Status: Abnormal   Collection Time: 02/23/16  8:38 PM  Result Value Ref Range   Glucose-Capillary 126 (H) 65 - 99 mg/dL  Glucose, capillary     Status: None   Collection Time: 02/24/16  5:46 AM  Result Value Ref Range   Glucose-Capillary 84 65 - 99 mg/dL  Glucose, capillary     Status: Abnormal   Collection Time: 02/24/16 11:41 AM  Result Value Ref Range   Glucose-Capillary 124 (H) 65 - 99 mg/dL    Blood Alcohol level:  Lab Results  Component Value Date   ETH 6 (H) 02/13/2016   ETH <5 11/18/2015    Physical Findings: AIMS: Facial and Oral Movements Muscles of Facial Expression: None, normal Lips and Perioral Area: None, normal Jaw: None, normal Tongue: None, normal,Extremity Movements Upper (arms, wrists, hands, fingers): None, normal Lower (legs, knees, ankles, toes): None, normal, Trunk Movements Neck, shoulders, hips: None, normal, Overall Severity Severity of abnormal movements (highest score from questions above): None, normal Incapacitation due to abnormal movements: None, normal Patient's awareness of abnormal movements (rate only patient's report): No Awareness, Dental Status Current problems with teeth and/or dentures?: No Does patient usually wear dentures?: No  CIWA:  CIWA-Ar Total: 1 COWS:     Musculoskeletal: Strength & Muscle Tone: within normal limits Gait & Station: normal Patient leans: N/A  Psychiatric Specialty Exam: Review of Systems  Gastrointestinal: Negative for diarrhea.  Musculoskeletal: Positive for myalgias.  Psychiatric/Behavioral: Positive for depression, hallucinations and  substance abuse. The patient is nervous/anxious.   All other systems reviewed and are negative.   Blood pressure 139/86, pulse 86, temperature 98.7 F (37.1 C), temperature source Oral, resp. rate 20, height 5\' 3"  (1.6 m), weight 95.7 kg (211 lb).Body mass index is 37.38 kg/m.  General Appearance: Casual  Eye Contact::  Fair  Speech:  Clear and Coherent  Volume:  Normal  Mood:  Anxious and Dysphoric   Affect:  Depressed  Thought Process:  Goal Directed and Descriptions of Associations: Circumstantial  Orientation:  Full (Time, Place, and Person)  Thought Content:  Hallucinations: Auditory    Suicidal Thoughts:  has it on and off , denies plan    Homicidal Thoughts:  No  Memory:  Immediate;   Fair Recent;   Fair Remote;   Fair  Judgement:  Fair  Insight:  Shallow  Psychomotor Activity:  Restlessness  Concentration:  Fair  Recall:  [employer] of Knowledge:Fair  Language: Fair  Akathisia:  No  Handed:  Right  AIMS (if indicated):     Assets:  Communication Skills Desire for Improvement Resilience  ADL's:  Intact  Cognition: WNL  Sleep:  Number of Hours: 6.5     Treatment Plan Summary:Maureen Swanson is a  50 y.o.AA female , who has a hx of schizoaffective do , cocaine, alcohol, cannabis abuse, who voluntarily presents to [facility] s/p suicide attempt , continues to be depressed, although progressing.  Daily contact with patient to assess and evaluate symptoms and progress in treatment and Medication management   Will continue CIWA/Librium protocol for alcohol withdrawal sx. Will continue Abilify Maintena IM 400 mg - last dose per pt was on august 10 th - repeat q28 days- last received IM on  02/20/16. Will continue Gabapentin 400 mg po tid for mood lability/anxiety/pain. Will Increase Trazodone 150 mg po qhs for sleep. Will continue prozac 40 mg po daily for affective sx. Will continue Depakote dr 250 mg po q8h for mood (depakote level is 60 ug/ml on 02/19/16 (therapeutic) . Will  continue Trilafon 2 mg po bid for psychosis - to augment the abilify maintena IM . Pt with multiple medical issues - please Hospitalist /diabetic consult notes.  Will continue to monitor vitals ,medication compliance and treatment side effects  Will monitor for medical issues as well as call consult as needed.  CSW will continue  working on disposition.  Patient referred to a substance abuse treatment program- pending placement. Encourage Patient to participate in therapeutic milieu    Ambrose Finland, MD 02/24/2016, 1:34 PM

## 2016-02-24 NOTE — Progress Notes (Signed)
Patient ID: Maureen Swanson, female   DOB: [birth year], 50 y.o.   MRN: [id number] D: Client reports of her day "boring" Client in bed but reports she has been up to groups today. Client plans is to go to shelter upon discharge. "ACT team coming to pick me up." A: Writer provided emotional support, reviewed medications, administered as ordered. Staff will monitor q62min for safety. R: Client is safe on the unit, attended group.

## 2016-02-24 NOTE — Progress Notes (Signed)
Patient ID: Maureen Swanson, female   DOB: 31-Jan-1966, 50 y.o.   MRN: [id number] D: Assumed care patient @ 2330. Patient in bed sleeping. Respiration regular and unlabored. No sign of distress noted at this time A: 15 mins checks for safety. R: Patient remains safe.

## 2016-02-24 NOTE — Progress Notes (Signed)
[place] Group Notes:  (Nursing/MHT/Case Management/Adjunct)  Date:  02/24/2016  Time:  9:12 PM  Type of Therapy:  Psychoeducational Skills  Participation Level:  Minimal  Participation Quality:  Attentive  Affect:  Flat  Cognitive:  Appropriate  Insight:  Lacking  Engagement in Group:  Limited  Modes of Intervention:  Education  Summary of Progress/Problems: The patient verbalized in group that she had a "so so" day overall. She states that she felt nauseated earlier in the day and that she attended her groups. As for the theme of the day, her support system will consist of her son.   Archie Balboa S 02/24/2016, 9:12 PM

## 2016-02-25 LAB — GLUCOSE, CAPILLARY
GLUCOSE-CAPILLARY: 93 mg/dL (ref 65–99)
GLUCOSE-CAPILLARY: 120 mg/dL — AB (ref 65–99)

## 2016-02-25 MED ORDER — METFORMIN HCL 1000 MG PO TABS: 1000.0000 mg | ORAL_TABLET | Freq: Two times a day (BID) | ORAL | 0 refills | Status: DC

## 2016-02-25 MED ORDER — GABAPENTIN 400 MG PO CAPS: 400.0000 mg | ORAL_CAPSULE | Freq: Three times a day (TID) | ORAL | 0 refills | Status: DC

## 2016-02-25 MED ORDER — PERPHENAZINE 2 MG PO TABS: 2.0000 mg | ORAL_TABLET | ORAL | 0 refills | Status: DC

## 2016-02-25 MED ORDER — LINAGLIPTIN 5 MG PO TABS: 5.0000 mg | ORAL_TABLET | Freq: Every day | ORAL | 0 refills | Status: DC

## 2016-02-25 MED ORDER — ARIPIPRAZOLE ER 400 MG IM SUSR: 400.0000 mg | INTRAMUSCULAR | 0 refills | Status: DC

## 2016-02-25 MED ORDER — AMLODIPINE BESYLATE 5 MG PO TABS: 5.0000 mg | ORAL_TABLET | Freq: Every day | ORAL | 0 refills | Status: DC

## 2016-02-25 MED ORDER — TRAZODONE HCL 150 MG PO TABS: 150.0000 mg | ORAL_TABLET | Freq: Every day | ORAL | 0 refills | Status: DC

## 2016-02-25 MED ORDER — ATENOLOL 100 MG PO TABS: 100.0000 mg | ORAL_TABLET | Freq: Every day | ORAL | 0 refills | Status: DC

## 2016-02-25 MED ORDER — BENZTROPINE MESYLATE 0.5 MG PO TABS: 0.5000 mg | ORAL_TABLET | Freq: Every day | ORAL | 0 refills | Status: DC

## 2016-02-25 MED ORDER — HYDROXYZINE HCL 25 MG PO TABS: 25.0000 mg | ORAL_TABLET | Freq: Three times a day (TID) | ORAL | 0 refills | Status: DC | PRN

## 2016-02-25 MED ORDER — ATORVASTATIN CALCIUM 40 MG PO TABS: 40.0000 mg | ORAL_TABLET | Freq: Every day | ORAL | 0 refills | Status: DC

## 2016-02-25 MED ORDER — DIVALPROEX SODIUM 250 MG PO DR TAB: 250.0000 mg | DELAYED_RELEASE_TABLET | Freq: Three times a day (TID) | ORAL | 0 refills | Status: DC

## 2016-02-25 NOTE — Progress Notes (Signed)
Recreation Therapy Notes  Date: 02/25/16 Time: 1000 Location: 500 Hall Dayroom  Group Topic: Communication  Goal Area(s) Addresses:  Patient will effectively communicate with peers in group.  Patient will verbalize benefit of healthy communication. Patient will verbalize positive effect of healthy communication on post d/c goals.  Patient will identify communication techniques that made activity effective for group.   Behavioral Response: Attentive  Intervention:  Dry erase board, eraser, dry erase marker, strips of paper with random words   Activity: [place].  LRT are divided the group into two teams.  Each person would get a turn.  Each person would draw a strip of paer from the container.  The person has to draw whatever is on the paper,  on the board.  While they are drawing, their team is trying to guess what it is they are drawing.  If the team guesses the picture they get a point.     Education: Communication, Discharge Planning  Education Outcome: Acknowledges understanding/In group clarification offered/Needs additional education.   Clinical Observations/Feedback: Pt arrived late.  Pt was attentive and participated very little.   Victorino Sparrow, LRT/CTRS        Victorino Sparrow A 02/25/2016 11:39 AM

## 2016-02-25 NOTE — Tx Team (Signed)
Interdisciplinary Treatment and Diagnostic Plan Update  02/25/2016 Time of Session: 9:09 AM  Maureen Swanson MRN: [id number]  Principal Diagnosis: Schizoaffective disorder, bipolar type ([place])  Secondary Diagnoses: Principal Problem:   Schizoaffective disorder, bipolar type ([place]) Active Problems:   Essential hypertension   Cocaine use disorder, moderate, dependence (HCC)   Alcohol use disorder, moderate, dependence ([place])   Tobacco use disorder   Diabetes mellitus ([place])   Cannabis use disorder, moderate, dependence ([place])   Hypomagnesemia   Current Medications:  Current Facility-Administered Medications  Medication Dose Route Frequency Provider Last Rate Last Dose  . acetaminophen (TYLENOL) tablet 650 mg  650 mg Oral Q6H PRN [name], MD   650 mg at 02/22/16 1429  . alum & mag hydroxide-simeth (MAALOX/MYLANTA) 200-200-20 MG/5ML suspension 30 mL  30 mL Oral Q4H PRN Saramma Eappen, MD      . amLODipine (NORVASC) tablet 5 mg  5 mg Oral Daily Saramma Eappen, MD   5 mg at 02/25/16 0800  . ARIPiprazole ER SUSR 400 mg  400 mg Intramuscular Q28 days [name], MD   400 mg at 02/20/16 0808  . atenolol (TENORMIN) tablet 100 mg  100 mg Oral Daily [name], MD   100 mg at 02/25/16 0801  . atorvastatin (LIPITOR) tablet 40 mg  40 mg Oral q1800 [name], MD   40 mg at 02/24/16 1723  . benztropine (COGENTIN) tablet 0.5 mg  0.5 mg Oral QHS [name], MD   0.5 mg at 02/24/16 2129  . divalproex (DEPAKOTE) DR tablet 250 mg  250 mg Oral Q8H Saramma Eappen, MD   250 mg at 02/25/16 0622  . gabapentin (NEURONTIN) capsule 400 mg  400 mg Oral TID [name], MD   400 mg at 02/25/16 0800  . hydrOXYzine (ATARAX/VISTARIL) tablet 25 mg  25 mg Oral TID PRN [name], MD   25 mg at 02/22/16 1430  . hydrOXYzine (ATARAX/VISTARIL) tablet 50 mg  50 mg Oral QHS,MR X 1 [name], PA-C   50 mg at 02/24/16 2130  . insulin aspart (novoLOG) injection 0-15 Units  0-15 Units Subcutaneous  TID WC [name], MD   3 Units at 02/24/16 1723  . insulin aspart (novoLOG) injection 0-5 Units  0-5 Units Subcutaneous QHS Saramma Eappen, MD      . insulin glargine (LANTUS) injection 20 Units  20 Units Subcutaneous QHS Nishant Dhungel, MD   20 Units at 02/24/16 2141  . linagliptin (TRADJENTA) tablet 5 mg  5 mg Oral Daily [name], MD   5 mg at 02/25/16 0801  . loperamide (IMODIUM) capsule 2 mg  2 mg Oral PRN [name], MD      . loratadine (CLARITIN) tablet 10 mg  10 mg Oral Daily [name], MD   10 mg at 02/25/16 0801  . magnesium hydroxide (MILK OF MAGNESIA) suspension 30 mL  30 mL Oral Daily PRN [name], MD      . metFORMIN (GLUCOPHAGE) tablet 1,000 mg  1,000 mg Oral BID WC [name], MD   1,000 mg at 02/25/16 0801  . multivitamin with minerals tablet 1 tablet  1 tablet Oral Daily [name], MD   1 tablet at 02/25/16 0801  . nicotine (NICODERM CQ - dosed in mg/24 hours) patch 21 mg  21 mg Transdermal Daily Saramma Eappen, MD   21 mg at 02/25/16 0800  . perphenazine (TRILAFON) tablet 2 mg  2 mg Oral BH-qamhs Saramma Eappen, MD   2 mg at  02/25/16 0801  . thiamine (B-1) injection 100 mg  100 mg Intramuscular Once Saramma Eappen, MD      . thiamine (VITAMIN B-1) tablet 100 mg  100 mg Oral Daily Saramma Eappen, MD   100 mg at 02/25/16 0801  . traZODone (DESYREL) tablet 150 mg  150 mg Oral QHS Ambrose Finland, MD   150 mg at 02/24/16 2129    PTA Medications: Prescriptions Prior to Admission  Medication Sig Dispense Refill Last Dose  . amLODipine (NORVASC) 5 MG tablet Take 1 tablet (5 mg total) by mouth daily. For high blood pressure 1 tablet 0 Past Month at Unknown time  . ARIPiprazole 400 MG SUSR Inject 400 mg into the muscle every 28 (twenty-eight) days. (Due to be administered on 12-19-15): For mood control 1 each 0 01/24/2016  . aspirin 81 MG chewable tablet Chew 1 tablet (81 mg total) by mouth daily. For heart health 30 tablet 0 Past Week at  Unknown time  . atenolol (TENORMIN) 100 MG tablet Take 1 tablet (100 mg total) by mouth daily. For high blood pressure 30 tablet 0 Past Week at 8am  . atorvastatin (LIPITOR) 40 MG tablet Take 1 tablet (40 mg total) by mouth every evening. For high cholesterol 1 tablet 0 Past Month at Unknown time  . cetirizine (ZYRTEC) 10 MG tablet Take 1 tablet (10 mg total) by mouth daily. (May purchase this medicines from over the counter at yr local pharmacy): For allergies   Past Month at Unknown time  . FLUoxetine (PROZAC) 40 MG capsule Take 1 capsule (40 mg total) by mouth daily. For depression 30 capsule 0 Past Month at Unknown time  . furosemide (LASIX) 40 MG tablet Take 40 mg by mouth daily.   Past Month at Unknown time  . gabapentin (NEURONTIN) 400 MG capsule Take 1 capsule (400 mg total) by mouth 3 (three) times daily. For agitation 90 capsule 0 Past Month at Unknown time  . insulin glargine (LANTUS) 100 UNIT/ML injection Inject 0.3 mLs (30 Units total) into the skin at bedtime. For diabetes management 10 mL 1 Past Month at Unknown time  . linagliptin (TRADJENTA) 5 MG TABS tablet Take 1 tablet (5 mg total) by mouth daily. For diabetes management (Patient not taking: Reported on 02/13/2016) 1 tablet 0 Not Taking at Unknown time  . metFORMIN (GLUCOPHAGE) 1000 MG tablet Take 1 tablet (1,000 mg total) by mouth 2 (two) times daily with a meal. For diabetes management 1 tablet 0 Past Month at Unknown time  . metFORMIN (GLUCOPHAGE) 1000 MG tablet Take 1 tablet (1,000 mg total) by mouth 2 (two) times daily with a meal. (Patient not taking: Reported on 02/13/2016) 1 tablet 0 Not Taking at Unknown time  . nicotine (NICODERM CQ - DOSED IN MG/24 HOURS) 21 mg/24hr patch Place 1 patch (21 mg total) onto the skin daily at 6 (six) AM. For smoking cessation 28 patch 0 Past Month at Unknown time  . QUEtiapine (SEROQUEL) 300 MG tablet Take 2 tablets (600 mg total) by mouth at bedtime. For mood control 120 tablet 0 02/12/2016 at  Unknown time  . sitaGLIPtin (JANUVIA) 25 MG tablet Take 25 mg by mouth daily.   Past Month at Unknown time  . traZODone (DESYREL) 150 MG tablet Take 1 tablet (150 mg total) by mouth at bedtime. For sleep 30 tablet 0 02/12/2016 at Unknown time    Treatment Modalities: Medication Management, Group therapy, Case management,  1 to 1 session with clinician, Psychoeducation, Recreational therapy.  Physician Treatment Plan for Primary Diagnosis: Schizoaffective disorder, bipolar type ([place]) Long Term Goal(s): Improvement in symptoms so as ready for discharge  Short Term Goals: Ability to disclose and discuss suicidal ideas  Medication Management: Evaluate patient's response, side effects, and tolerance of medication regimen.  Therapeutic Interventions: 1 to 1 sessions, Unit Group sessions and Medication administration.  Evaluation of Outcomes: Met  Physician Treatment Plan for Secondary Diagnosis: Principal Problem:   Schizoaffective disorder, bipolar type ([place]) Active Problems:   Essential hypertension   Cocaine use disorder, moderate, dependence (HCC)   Alcohol use disorder, moderate, dependence (HCC)   Tobacco use disorder   Diabetes mellitus ([place])   Cannabis use disorder, moderate, dependence ([place])   Hypomagnesemia   Long Term Goal(s): Improvement in symptoms so as ready for discharge  Short Term Goals: Ability to identify triggers associated with substance abuse/mental health issues will improve  Medication Management: Evaluate patient's response, side effects, and tolerance of medication regimen.  Therapeutic Interventions: 1 to 1 sessions, Unit Group sessions and Medication administration.  Evaluation of Outcomes: Met   RN Treatment Plan for Primary Diagnosis: Schizoaffective disorder, bipolar type ([place]) Long Term Goal(s): Knowledge of disease and therapeutic regimen to maintain health will improve  Short Term Goals: Ability to disclose and discuss suicidal ideas and  Ability to identify and develop effective coping behaviors will improve  Medication Management: RN will administer medications as ordered by provider, will assess and evaluate patient's response and provide education to patient for prescribed medication. RN will report any adverse and/or side effects to prescribing provider.  Therapeutic Interventions: 1 on 1 counseling sessions, Psychoeducation, Medication administration, Evaluate responses to treatment, Monitor vital signs and CBGs as ordered, Perform/monitor CIWA, COWS, AIMS and Fall Risk screenings as ordered, Perform wound care treatments as ordered.  Evaluation of Outcomes: Met  LCSW Treatment Plan for Primary Diagnosis: Schizoaffective disorder, bipolar type ([place]) Long Term Goal(s): Safe transition to appropriate next level of care at discharge, Engage patient in therapeutic group addressing interpersonal concerns.  Short Term Goals: Engage patient in aftercare planning with referrals and resources and Identify triggers associated with mental health/substance abuse issues  Therapeutic Interventions: Assess for all discharge needs, 1 to 1 time with Social worker, Explore available resources and support systems, Assess for adequacy in community support network, Educate family and significant other(s) on suicide prevention, Complete Psychosocial Assessment, Interpersonal group therapy.  Evaluation of Outcomes: Met   Progress in Treatment: Attending groups: Yes Participating in groups: Yes Taking medication as prescribed: Yes Toleration medication: Yes, no side effects reported at this time Family/Significant other contact made: Yes  ACT team Patient understands diagnosis: Yes AEB asking for help with SI and AH Discussing patient identified problems/goals with staff: Yes Medical problems stabilized or resolved: Yes Denies suicidal/homicidal ideation: Yes Issues/concerns per patient self-inventory: None Other: N/A  New problem(s)  identified: None identified at this time.   New Short Term/Long Term Goal(s): None identified at this time.   Discharge Plan or Barriers: Patient requesting referral to [facility], back-up plan unknown at this time.  9/7:  Pt psychiatric symptoms preventing placement at [facility]; ACT team coming to day to talk with her about living options 9/11:  ACT team to pick up and help pt secure bed in shelter  Reason for Continuation of Hospitalization:   Estimated Length of Stay: D/C today  Attendees: Patient: 02/25/2016  9:09 AM  Physician: [name], MD 02/25/2016  9:09 AM  Nursing: Hoy Register, RN 02/25/2016  9:09 AM  RN Care Manager: Lars Pinks, RN 02/25/2016  9:09 AM  Social Worker: Ripley Fraise 02/25/2016  9:09 AM  Recreational Therapist: Marjette  02/25/2016  9:09 AM  Other: Norberto Sorenson 02/25/2016  9:09 AM  Other: [name]  02/25/2016  9:09 AM    Scribe for Treatment Team:  [name] 02/25/2016 9:09 AM

## 2016-02-25 NOTE — [facility] Suicide Risk Assessment (Signed)
[facility] Discharge Suicide Risk Assessment   Principal Problem: Schizoaffective disorder, bipolar type [facility]) Discharge Diagnoses:  Patient Active Problem List   Diagnosis Date Noted  . Hypomagnesemia [E83.42]   . Cannabis use disorder, moderate, dependence ([place]) [F12.20] 11/20/2015  . Homicidal ideation [R45.850]   . Tobacco use disorder [F17.200] 07/20/2015  . Diabetes mellitus ([place]) [E11.9] 07/20/2015  . Cocaine use disorder, moderate, dependence ([place]) [F14.20] 04/03/2015  . Schizoaffective disorder, bipolar type ([place]) [F25.0] 04/03/2015  . Alcohol use disorder, moderate, dependence ([place]) [F10.20] 04/03/2015  . Acute ischemic stroke ([place]) [I63.9] 12/08/2014  . Left-sided weakness [M62.89] 12/08/2014  . 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 Knee pain [M25.569] 10/17/2010  . MAMMOGRAM, ABNORMAL, RIGHT [R92.8] 08/05/2010  . Dyslipidemia [E78.5] 06/21/2010  . AMENORRHEA, SECONDARY [N91.2] 10/29/2009  . 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 GERD [K21.9] 10/11/2007  . Essential hypertension [I10] 02/26/2007    Total Time spent with patient: 30 minutes  Musculoskeletal: Strength & Muscle Tone: within normal limits Gait & Station: normal Patient leans: N/A  Psychiatric Specialty Exam: ROS  Blood pressure (!) 137/94, pulse 82, temperature 98.6 F (37 C), temperature source Oral, resp. rate 16, height 5\' 3"  (1.6 m), weight 95.7 kg (211 lb).Body mass index is 37.38 kg/m.  General Appearance: Casual  Eye Contact::  Fair  Speech:  Clear and Coherent409  Volume:  Normal  Mood:  Euthymic  Affect:  Appropriate  Thought Process:  Goal Directed and Descriptions of Associations: Intact  Orientation:  Full (Time, Place, and Person)  Thought Content:  Logical  Suicidal Thoughts:  No  Homicidal Thoughts:  No  Memory:  Immediate;   Fair Recent;   Fair Remote;   Fair  Judgement:  Fair  Insight:  Fair  Psychomotor Activity:  Normal  Concentration:  Fair  Recall:  [employer] of Knowledge:Fair  Language: Fair  Akathisia:  No  Handed:  Right  AIMS (if indicated):   0  Assets:  Desire for Improvement  Sleep:  Number of Hours: 6.5  Cognition: WNL  ADL's:  Intact   Mental Status Per Nursing Assessment::   On Admission:  Suicidal ideation indicated by patient, Suicide plan, Plan includes specific time, place, or method, Self-harm thoughts, Self-harm behaviors  Demographic Factors:  NA  Loss Factors: Financial problems/change in socioeconomic status  Historical Factors: Impulsivity  Risk Reduction Factors:   Positive therapeutic relationship  Continued Clinical Symptoms:  Alcohol/Substance Abuse/Dependencies Previous Psychiatric Diagnoses and Treatments  Cognitive Features That Contribute To Risk:  Polarized thinking    Suicide Risk:  Minimal: No identifiable suicidal ideation.  Patients presenting with no risk factors but with morbid ruminations; may be classified as minimal risk based on the severity of the depressive symptoms  Follow-up Information    [facility] ACT team .   Why:  Debbra Riding will be here at 11:00 the day of d/c to pick you up. Contact information: [place] recommendations:  Activity:  no restrictions Diet:  regular Tests:  none Other:  Abilify Maintena 400 mg IM last dose - 02/20/16 - repeat q28 days  Tierre Netto, MD 02/25/2016, 9:27 AM

## 2016-02-25 NOTE — Plan of Care (Signed)
Problem: Safety: Goal: Ability to remain free from injury will improve Outcome: Progressing Client is safe on the unit AEB q9min safety, CIWA, COW of 0. Client denies s/s of withdrawals.

## 2016-02-25 NOTE — [facility] Suicide Risk Assessment (Signed)
[facility] INPATIENT:  Family/Significant Other Suicide Prevention Education  Suicide Prevention Education:  Patient Refusal for Family/Significant Other Suicide Prevention Education: The patient Maureen Swanson has refused to provide written consent for family/significant other to be provided Family/Significant Other Suicide Prevention Education during admission and/or prior to discharge.  Physician notified.You can call my ACT team.  I don't need you to call anyone else."  [name] 02/25/2016, 9:08 AM

## 2016-02-25 NOTE — Discharge Summary (Deleted)
Physician Discharge Summary Note  Patient:  Maureen Swanson is an 50 y.o., female MRN:  [id number] DOB:  1965-08-24 Patient phone:  [phone] (home)  Patient address:   [address] [place] [address],  Total Time spent with patient: 30 minutes  Date of Admission:  02/13/2016 Date of Discharge:  02/25/16  Reason for Admission:  Worsening depression  Principal Problem: Schizoaffective disorder, bipolar type Maureen Swanson) Discharge Diagnoses: Patient Active Problem List   Diagnosis Date Noted  . Schizoaffective disorder, bipolar type ([place]) [F25.0] 04/03/2015    Priority: High  . Hypomagnesemia [E83.42]   . Cannabis use disorder, moderate, dependence ([place]) [F12.20] 11/20/2015  . Homicidal ideation [R45.850]   . Tobacco use disorder [F17.200] 07/20/2015  . Diabetes mellitus ([place]) [E11.9] 07/20/2015  . Cocaine use disorder, moderate, dependence ([place]) [F14.20] 04/03/2015  . Alcohol use disorder, moderate, dependence ([place]) [F10.20] 04/03/2015  . Acute ischemic stroke ([place]) [I63.9] 12/08/2014  . Left-sided weakness [M62.89] 12/08/2014  . 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 Knee pain [M25.569] 10/17/2010  . MAMMOGRAM, ABNORMAL, RIGHT [R92.8] 08/05/2010  . Dyslipidemia [E78.5] 06/21/2010  . AMENORRHEA, SECONDARY [N91.2] 10/29/2009  . 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 GERD [K21.9] 10/11/2007  . Essential hypertension [I10] 02/26/2007    Past Psychiatric History: see HPI  Past Medical History:  Past Medical History:  Diagnosis Date  . Anxiety   . Arthritis    back and knees  . Asthma    daily and prn inhalers  . 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 Bipolar 1 disorder ([place])   . CHF (congestive heart failure) ([place])   . Depression   . Diabetes mellitus    diet-controlled  . Gastric ulcer   . GERD (gastroesophageal reflux disease)   . Gout   . Headache(784.0)    migraines  . High cholesterol   . Hypertension    under  control, has been on med. x 12 yrs.  . Substance abuse    crack cocaine  . TMJ (temporomandibular joint disorder)     Past Surgical History:  Procedure Laterality Date  . BREAST LUMPECTOMY WITH NEEDLE LOCALIZATION  04/19/2012   Procedure: BREAST LUMPECTOMY WITH NEEDLE LOCALIZATION;  Surgeon: [name], MD;  Location: [facility];  Service: General;  Laterality: Right;  . KNEE ARTHROSCOPY W/ PARTIAL MEDIAL MENISCECTOMY  05/01/2010   right   Family History:  Family History  Problem Relation Age of Onset  . Diabetes Mother   . Breast cancer Mother   . Cancer Father   . Bipolar disorder Maternal Aunt   . Schizophrenia Maternal Grandfather   . Alcoholism Maternal Uncle    Family Psychiatric  History: see HPI Social History:  History  Alcohol Use  . 0.0 oz/week    Comment: reports she drinks 3 40 ounce beers daily     History  Drug Use  . Types: Cocaine    Comment: 09/28/2015    Social History   Social History  . Marital status: Single    Spouse name: N/A  . Number of children: N/A  . Years of education: N/A   Social History Main Topics  . Smoking status: Current Every Day Smoker    Packs/day: 1.00    Years: 0.00    Types: Cigarettes  . Smokeless tobacco: Never Used  . Alcohol use 0.0 oz/week     Comment: reports she drinks 3 40 ounce beers daily  .  Drug use:     Types: Cocaine     Comment: 09/28/2015  . Sexual activity: No   Other Topics Concern  . None   Social History Narrative  . None    Hospital Course:   Maureen Swanson is a 50 y.o. AA female, who is divorced , has a hx of schizoaffective do as well as cocaine abuse, alcohol abuse , stimulant abuse , who presented [facility] ED with worsening depression and s/p suicide attempt by OD on seroquel and trazodone.  Physical Findings: AIMS: Facial and Oral Movements Muscles of Facial Expression: None, normal Lips and Perioral Area: None, normal Jaw: None, normal Tongue: None, normal,Extremity  Movements Upper (arms, wrists, hands, fingers): None, normal Lower (legs, knees, ankles, toes): None, normal, Trunk Movements Neck, shoulders, hips: None, normal, Overall Severity Severity of abnormal movements (highest score from questions above): None, normal Incapacitation due to abnormal movements: None, normal Patient's awareness of abnormal movements (rate only patient's report): No Awareness, Dental Status Current problems with teeth and/or dentures?: No Does patient usually wear dentures?: No  CIWA:  CIWA-Ar Total: 1 COWS:     Musculoskeletal: Strength & Muscle Tone: within normal limits Gait & Station: normal Patient leans: N/A  Psychiatric Specialty Exam:  SEE MD SRA Physical Exam  Nursing note and vitals reviewed. Psychiatric: She has a normal mood and affect. Her speech is normal and behavior is normal. Judgment and thought content normal. Cognition and memory are normal.    Review of Systems  Constitutional: Negative.   HENT: Negative.   Eyes: Negative.   Respiratory: Negative.   Cardiovascular: Negative.   Gastrointestinal: Negative.   Genitourinary: Negative.   Musculoskeletal: Negative.   Skin: Negative.   Neurological: Negative.   Endo/Heme/Allergies: Negative.   Psychiatric/Behavioral: Negative.     Blood pressure (!) 137/94, pulse 82, temperature 98.6 F (37 C), temperature source Oral, resp. rate 16, height 5\' 3"  (1.6 m), weight 95.7 kg (211 lb).Body mass index is 37.38 kg/m.    Have you used any form of tobacco in the last 30 days? (Cigarettes, Smokeless Tobacco, Cigars, and/or Pipes): No  Has this patient used any form of tobacco in the last 30 days? (Cigarettes, Smokeless Tobacco, Cigars, and/or Pipes) Yes, N/A  Blood Alcohol level:  Lab Results  Component Value Date   ETH 6 (H) 02/13/2016   ETH <5 Q000111Q    Metabolic Disorder Labs:  Lab Results  Component Value Date   HGBA1C 5.1 11/20/2015   MPG 100 11/20/2015   MPG 120 08/07/2015    Lab Results  Component Value Date   PROLACTIN 5.7 08/10/2015   PROLACTIN 141.3 (H) 07/21/2015   Lab Results  Component Value Date   CHOL 159 08/10/2015   TRIG 122 08/10/2015   HDL 48 08/10/2015   CHOLHDL 3.3 08/10/2015   VLDL 24 08/10/2015   LDLCALC 87 08/10/2015   LDLCALC 63 07/21/2015    See Psychiatric Specialty Exam and Suicide Risk Assessment completed by Attending Physician prior to discharge.  Discharge destination:  Home  Is patient on multiple antipsychotic therapies at discharge:  No   Has Patient had three or more failed trials of antipsychotic monotherapy by history:  No  Recommended Plan for Multiple Antipsychotic Therapies: NA     Medication List    STOP taking these medications   aspirin 81 MG chewable tablet   cetirizine 10 MG tablet Commonly known as:  ZYRTEC   FLUoxetine 40 MG capsule Commonly known as:  PROZAC  furosemide 40 MG tablet Commonly known as:  LASIX   insulin glargine 100 UNIT/ML injection Commonly known as:  LANTUS   nicotine 21 mg/24hr patch Commonly known as:  NICODERM CQ - dosed in mg/24 hours   QUEtiapine 300 MG tablet Commonly known as:  SEROQUEL   sitaGLIPtin 25 MG tablet Commonly known as:  JANUVIA     TAKE these medications     Indication  amLODipine 5 MG tablet Commonly known as:  NORVASC Take 1 tablet (5 mg total) by mouth daily. What changed:  additional instructions  Indication:  High Blood Pressure Disorder   ARIPiprazole ER 400 MG Susr Inject 400 mg into the muscle every 28 (twenty-eight) days. Due next Oct 4.  Last dose was given Sept 6. Start taking on:  03/19/2016 What changed:  additional instructions  Indication:  Schizophrenia   atenolol 100 MG tablet Commonly known as:  TENORMIN Take 1 tablet (100 mg total) by mouth daily. What changed:  additional instructions  Indication:  High Blood Pressure Disorder   atorvastatin 40 MG tablet Commonly known as:  LIPITOR Take 1 tablet (40 mg total)  by mouth daily at 6 PM. What changed:  when to take this  additional instructions  Indication:  High Amount of Triglycerides in the Blood   benztropine 0.5 MG tablet Commonly known as:  COGENTIN Take 1 tablet (0.5 mg total) by mouth at bedtime.  Indication:  Extrapyramidal Reaction caused by Medications   divalproex 250 MG DR tablet Commonly known as:  DEPAKOTE Take 1 tablet (250 mg total) by mouth every 8 (eight) hours.  Indication:  mood stabilization   gabapentin 400 MG capsule Commonly known as:  NEURONTIN Take 1 capsule (400 mg total) by mouth 3 (three) times daily. What changed:  additional instructions  Indication:  Agitation   hydrOXYzine 25 MG tablet Commonly known as:  ATARAX/VISTARIL Take 1 tablet (25 mg total) by mouth 3 (three) times daily as needed for anxiety.  Indication:  Anxiety Neurosis   linagliptin 5 MG Tabs tablet Commonly known as:  TRADJENTA Take 1 tablet (5 mg total) by mouth daily. What changed:  additional instructions  Indication:  Type 2 Diabetes   metFORMIN 1000 MG tablet Commonly known as:  GLUCOPHAGE Take 1 tablet (1,000 mg total) by mouth 2 (two) times daily with a meal. What changed:  Another medication with the same name was removed. Continue taking this medication, and follow the directions you see here.  Indication:  Type 2 Diabetes   perphenazine 2 MG tablet Commonly known as:  TRILAFON Take 1 tablet (2 mg total) by mouth 2 (two) times daily in the am and at bedtime..  Indication:  mood stabilization   traZODone 150 MG tablet Commonly known as:  DESYREL Take 1 tablet (150 mg total) by mouth at bedtime. What changed:  additional instructions  Indication:  Trouble Sleeping      Follow-up Information    [facility] team .   Why:  Debbra Riding will be here at 11:00 the day of d/c to pick you up. Contact information: [place] [phone]          Follow-up recommendations:  Activity:  as tol Diet:  as  tol  Comments:  1.  Take all your medications as prescribed.   2.  Report any adverse side effects to outpatient provider. 3.  Patient instructed to not use alcohol or illegal drugs while on prescription medicines. 4.  In the event  of worsening symptoms, instructed patient to call 911, the crisis hotline or go to nearest emergency room for evaluation of symptoms.  Signed: Janett Labella, NP [facility] 02/25/2016, 4:12 PM

## 2016-02-25 NOTE — Plan of Care (Signed)
Problem: [facility] Participation in Recreation Therapeutic Interventions Goal: STG-Patient will identify at least five coping skills for ** STG: Coping Skills - Patient will be able to identify at least 5 coping skills for depression by conclusion of recreation therapy tx  Outcome: Completed/Met Date Met: 02/25/16 Pt was able to identify coping skills by completing anger management and wellness recreation therapy sessions.    Victorino Sparrow, LRT/CTRS

## 2016-02-25 NOTE — Progress Notes (Signed)
  [facility] Adult Case Management Discharge Plan :  Will you be returning to the same living situation after discharge:  No. At discharge, do you have transportation home?: Yes,  ACT team Do you have the ability to pay for your medications: Yes,  ACT team  Release of information consent forms completed and in the chart;  Patient's signature needed at discharge.  Patient to Follow up at: Follow-up Information    [facility] team .   Why:  Debbra Riding will be here at 11:00 the day of d/c to pick you up. Contact information: [place] [phone]          Next level of care provider has access to [place] and Suicide Prevention discussed: Yes,  yes  Have you used any form of tobacco in the last 30 days? (Cigarettes, Smokeless Tobacco, Cigars, and/or Pipes): No  Has patient been referred to the Quitline?: N/A patient is not a smoker  Patient has been referred for addiction treatment: Yes  [name] 02/25/2016, 2:33 PM

## 2016-02-25 NOTE — Progress Notes (Signed)
Patient appropriate, calm, and polite today.  Affect blunted, mood depressed. Did not complete self-inventory sheet after requested. Patient Denied SI, HI, and AVH.  Received discharge orders.  Reviewed medications, discharge instructions, and follow up appointments with patient.   Agrees to contact ACCT team or 911 if she has thoughts/intent to harm self or others.  Around 1300 patient stated "is it too late to tell the doctor I feel like I need to stay another night?"  At that point patient denied active SI, reported moderate non-threatening auditory hallucinations.  Denied intent to harm self after discharge.  Nurse spoke with MD, MD said patient would continue to discharge.  Medication samples and scripts handed to patient.  Paperwork, AVS, SRA, and transition record handed to patient.   Escorted off of unit at 1435. Belongings returned per belongings form.  Discharged to lobby where she was met by [facility] team member.  To follow up per AVS.

## 2016-02-26 NOTE — Discharge Summary (Signed)
Physician Discharge Summary Note  Patient:  Maureen Swanson is an 50 y.o., female MRN:  [id number] DOB:  [birth year] Patient phone:  [phone] (home)                    Patient address:   [address] [place] [address],                     Total Time spent with patient: 30 minutes  Date of Admission:  02/13/2016 Date of Discharge:  02/25/16  Reason for Admission:  Worsening depression  Principal Problem: Schizoaffective disorder, bipolar type [facility]) Discharge Diagnoses:      Patient Active Problem List   Diagnosis Date Noted  . Schizoaffective disorder, bipolar type ([place]) [F25.0] 04/03/2015    Priority: High  . Hypomagnesemia [E83.42]   . Cannabis use disorder, moderate, dependence ([place]) [F12.20] 11/20/2015  . Homicidal ideation [R45.850]   . Tobacco use disorder [F17.200] 07/20/2015  . Diabetes mellitus ([place]) [E11.9] 07/20/2015  . Cocaine use disorder, moderate, dependence ([place]) [F14.20] 04/03/2015  . Alcohol use disorder, moderate, dependence ([place]) [F10.20] 04/03/2015  . Acute ischemic stroke ([place]) [I63.9] 12/08/2014  . Left-sided weakness [M62.89] 12/08/2014  . 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 Knee pain [M25.569] 10/17/2010  . MAMMOGRAM, ABNORMAL, RIGHT [R92.8] 08/05/2010  . Dyslipidemia [E78.5] 06/21/2010  . AMENORRHEA, SECONDARY [N91.2] 10/29/2009  . 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 GERD [K21.9] 10/11/2007  . Essential hypertension [I10] 02/26/2007    Past Psychiatric History: see HPI  Past Medical History:      Past Medical History:  Diagnosis Date  . Anxiety   . Arthritis    back and knees  . Asthma    daily and prn inhalers  . 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 Bipolar 1 disorder ([place])   . CHF (congestive heart failure) ([place])   . Depression   . Diabetes mellitus    diet-controlled  . Gastric ulcer   . GERD (gastroesophageal reflux disease)   . Gout   .  Headache(784.0)    migraines  . High cholesterol   . Hypertension    under control, has been on med. x 12 yrs.  . Substance abuse    crack cocaine  . TMJ (temporomandibular joint disorder)          Past Surgical History:  Procedure Laterality Date  . BREAST LUMPECTOMY WITH NEEDLE LOCALIZATION  04/19/2012   Procedure: BREAST LUMPECTOMY WITH NEEDLE LOCALIZATION;  Surgeon: [name], MD;  Location: [place];  Service: General;  Laterality: Right;  . KNEE ARTHROSCOPY W/ PARTIAL MEDIAL MENISCECTOMY  05/01/2010   right   Family History:       Family History  Problem Relation Age of Onset  . Diabetes Mother   . Breast cancer Mother   . Cancer Father   . Bipolar disorder Maternal Aunt   . Schizophrenia Maternal Grandfather   . Alcoholism Maternal Uncle    Family Psychiatric  History: see HPI Social History:      History  Alcohol Use  . 0.0 oz/week    Comment: reports she drinks 3 40 ounce beers daily          History  Drug Use  . Types: Cocaine    Comment: 09/28/2015    Social History        Social History  . Marital status: Single    Spouse  name: N/A  . Number of children: N/A  . Years of education: N/A         Social History Main Topics  . Smoking status: Current Every Day Smoker    Packs/day: 1.00    Years: 0.00    Types: Cigarettes  . Smokeless tobacco: Never Used  . Alcohol use 0.0 oz/week     Comment: reports she drinks 3 40 ounce beers daily  . Drug use:     Types: Cocaine     Comment: 09/28/2015  . Sexual activity: No       Other Topics Concern  . None      Social History Narrative  . None    Hospital Course:  Maureen Swanson is a 50 y.o. AA female, who is divorced , has a hx of schizoaffective do as well as cocaine abuse, alcohol abuse , stimulant abuse , who [facility] with worsening depression and s/p suicide attempt by OD on seroquel and trazodone.  Physical  Findings: AIMS: Facial and Oral Movements Muscles of Facial Expression: None, normal Lips and Perioral Area: None, normal Jaw: None, normal Tongue: None, normal,Extremity Movements Upper (arms, wrists, hands, fingers): None, normal Lower (legs, knees, ankles, toes): None, normal, Trunk Movements Neck, shoulders, hips: None, normal, Overall Severity Severity of abnormal movements (highest score from questions above): None, normal Incapacitation due to abnormal movements: None, normal Patient's awareness of abnormal movements (rate only patient's report): No Awareness, Dental Status Current problems with teeth and/or dentures?: No Does patient usually wear dentures?: No  CIWA:  CIWA-Ar Total: 1 COWS:     Musculoskeletal: Strength & Muscle Tone: within normal limits Gait & Station: normal Patient leans: N/A  Psychiatric Specialty Exam:  SEE MD SRA Physical Exam  Nursing note and vitals reviewed. Psychiatric: She has a normal mood and affect. Her speech is normal and behavior is normal. Judgment and thought content normal. Cognition and memory are normal.    Review of Systems  Constitutional: Negative.   HENT: Negative.   Eyes: Negative.   Respiratory: Negative.   Cardiovascular: Negative.   Gastrointestinal: Negative.   Genitourinary: Negative.   Musculoskeletal: Negative.   Skin: Negative.   Neurological: Negative.   Endo/Heme/Allergies: Negative.   Psychiatric/Behavioral: Negative.     Blood pressure (!) 137/94, pulse 82, temperature 98.6 F (37 C), temperature source Oral, resp. rate 16, height 5\' 3"  (1.6 m), weight 95.7 kg (211 lb).Body mass index is 37.38 kg/m.    Have you used any form of tobacco in the last 30 days? (Cigarettes, Smokeless Tobacco, Cigars, and/or Pipes): No  Has this patient used any form of tobacco in the last 30 days? (Cigarettes, Smokeless Tobacco, Cigars, and/or Pipes) Yes, N/A  Blood Alcohol level:  Recent Labs       Lab Results   Component Value Date   ETH 6 (H) 02/13/2016   ETH <5 Q000111Q      Metabolic Disorder Labs:  Recent Labs       Lab Results  Component Value Date   HGBA1C 5.1 11/20/2015   MPG 100 11/20/2015   MPG 120 08/07/2015     Recent Labs       Lab Results  Component Value Date   PROLACTIN 5.7 08/10/2015   PROLACTIN 141.3 (H) 07/21/2015     Recent Labs       Lab Results  Component Value Date   CHOL 159 08/10/2015   TRIG 122 08/10/2015   HDL 48 08/10/2015   CHOLHDL  3.3 08/10/2015   VLDL 24 08/10/2015   LDLCALC 87 08/10/2015   LDLCALC 63 07/21/2015      See Psychiatric Specialty Exam and Suicide Risk Assessment completed by Attending Physician prior to discharge.  Discharge destination:  Home  Is patient on multiple antipsychotic therapies at discharge:  No   Has Patient had three or more failed trials of antipsychotic monotherapy by history:  No  Recommended Plan for Multiple Antipsychotic Therapies: NA        Medication List       STOP taking these medications      aspirin 81 MG chewable tablet  cetirizine 10 MG tablet Commonly known as:  ZYRTEC  FLUoxetine 40 MG capsule Commonly known as:  PROZAC  furosemide 40 MG tablet Commonly known as:  LASIX  insulin glargine 100 UNIT/ML injection Commonly known as:  LANTUS  nicotine 21 mg/24hr patch Commonly known as:  NICODERM CQ - dosed in mg/24 hours  QUEtiapine 300 MG tablet Commonly known as:  SEROQUEL  sitaGLIPtin 25 MG tablet Commonly known as:  JANUVIA          TAKE these medications     Indication  amLODipine 5 MG tablet Commonly known as:  NORVASC Take 1 tablet (5 mg total) by mouth daily. What changed:  additional instructions Indication:  High Blood Pressure Disorder  ARIPiprazole ER 400 MG Susr Inject 400 mg into the muscle every 28 (twenty-eight) days. Due next Oct 4.  Last dose was given Sept 6. Start taking on:  03/19/2016 What changed:  additional  instructions Indication:  Schizophrenia  atenolol 100 MG tablet Commonly known as:  TENORMIN Take 1 tablet (100 mg total) by mouth daily. What changed:  additional instructions Indication:  High Blood Pressure Disorder  atorvastatin 40 MG tablet Commonly known as:  LIPITOR Take 1 tablet (40 mg total) by mouth daily at 6 PM. What changed:  when to take this  additional instructions Indication:  High Amount of Triglycerides in the Blood  benztropine 0.5 MG tablet Commonly known as:  COGENTIN Take 1 tablet (0.5 mg total) by mouth at bedtime. Indication:  Extrapyramidal Reaction caused by Medications  divalproex 250 MG DR tablet Commonly known as:  DEPAKOTE Take 1 tablet (250 mg total) by mouth every 8 (eight) hours. Indication:  mood stabilization  gabapentin 400 MG capsule Commonly known as:  NEURONTIN Take 1 capsule (400 mg total) by mouth 3 (three) times daily. What changed:  additional instructions Indication:  Agitation  hydrOXYzine 25 MG tablet Commonly known as:  ATARAX/VISTARIL Take 1 tablet (25 mg total) by mouth 3 (three) times daily as needed for anxiety. Indication:  Anxiety Neurosis  linagliptin 5 MG Tabs tablet Commonly known as:  TRADJENTA Take 1 tablet (5 mg total) by mouth daily. What changed:  additional instructions Indication:  Type 2 Diabetes  metFORMIN 1000 MG tablet Commonly known as:  GLUCOPHAGE Take 1 tablet (1,000 mg total) by mouth 2 (two) times daily with a meal. What changed:  Another medication with the same name was removed. Continue taking this medication, and follow the directions you see here. Indication:  Type 2 Diabetes  perphenazine 2 MG tablet Commonly known as:  TRILAFON Take 1 tablet (2 mg total) by mouth 2 (two) times daily in the am and at bedtime.. Indication:  mood stabilization  traZODone 150 MG tablet Commonly known as:  DESYREL Take 1 tablet (150 mg total) by mouth at bedtime. What changed:  additional instructions Indication:   Trouble  Sleeping        Follow-up Information    [facility] team .   Why:  Debbra Riding will be here at 11:00 the day of d/c to pick you up. Contact information: [place] [phone]          Follow-up recommendations:  Activity:  as tol Diet:  as tol  Comments:  1.  Take all your medications as prescribed.   2.  Report any adverse side effects to outpatient provider. 3.  Patient instructed to not use alcohol or illegal drugs while on prescription medicines. 4.  In the event of worsening symptoms, instructed patient to call 911, the crisis hotline or go to nearest emergency room for evaluation of symptoms.  Signed: Janett Labella, NP [facility] 02/25/2016, 4:12 PM

## 2016-02-29 ENCOUNTER — Encounter (HOSPITAL_COMMUNITY): Payer: Self-pay | Admitting: *Deleted

## 2016-02-29 ENCOUNTER — Emergency Department (HOSPITAL_COMMUNITY)
Admission: EM | Admit: 2016-02-29 | Discharge: 2016-03-02 | Disposition: A | Discharge status: Other Acute Inpatient Hospital | Payer: Federal, State, Local not specified - Other | Attending: Emergency Medicine | Admitting: Emergency Medicine

## 2016-02-29 DIAGNOSIS — F1410 Cocaine abuse, uncomplicated: Secondary | ICD-10-CM | POA: Insufficient documentation

## 2016-02-29 DIAGNOSIS — J45909 Unspecified asthma, uncomplicated: Secondary | ICD-10-CM | POA: Insufficient documentation

## 2016-02-29 DIAGNOSIS — I110 Hypertensive heart disease with heart failure: Secondary | ICD-10-CM | POA: Insufficient documentation

## 2016-02-29 DIAGNOSIS — Z794 Long term (current) use of insulin: Secondary | ICD-10-CM | POA: Insufficient documentation

## 2016-02-29 DIAGNOSIS — F3113 Bipolar disorder, current episode manic without psychotic features, severe: Secondary | ICD-10-CM | POA: Insufficient documentation

## 2016-02-29 DIAGNOSIS — F17210 Nicotine dependence, cigarettes, uncomplicated: Secondary | ICD-10-CM | POA: Insufficient documentation

## 2016-02-29 DIAGNOSIS — Z7984 Long term (current) use of oral hypoglycemic drugs: Secondary | ICD-10-CM | POA: Insufficient documentation

## 2016-02-29 DIAGNOSIS — R45851 Suicidal ideations: Secondary | ICD-10-CM

## 2016-02-29 DIAGNOSIS — I509 Heart failure, unspecified: Secondary | ICD-10-CM | POA: Insufficient documentation

## 2016-02-29 DIAGNOSIS — F1420 Cocaine dependence, uncomplicated: Secondary | ICD-10-CM | POA: Diagnosis present

## 2016-02-29 DIAGNOSIS — M25571 Pain in right ankle and joints of right foot: Secondary | ICD-10-CM | POA: Insufficient documentation

## 2016-02-29 DIAGNOSIS — E119 Type 2 diabetes mellitus without complications: Secondary | ICD-10-CM | POA: Insufficient documentation

## 2016-02-29 DIAGNOSIS — F250 Schizoaffective disorder, bipolar type: Secondary | ICD-10-CM | POA: Diagnosis present

## 2016-02-29 DIAGNOSIS — G8929 Other chronic pain: Secondary | ICD-10-CM | POA: Insufficient documentation

## 2016-02-29 DIAGNOSIS — Z79899 Other long term (current) drug therapy: Secondary | ICD-10-CM | POA: Insufficient documentation

## 2016-02-29 LAB — COMPREHENSIVE METABOLIC PANEL
ALBUMIN: 4.3 g/dL (ref 3.5–5.0)
ALT: 16 U/L (ref 14–54)
AST: 24 U/L (ref 15–41)
Alkaline Phosphatase: 63 U/L (ref 38–126)
Anion gap: 9 (ref 5–15)
BUN: 15 mg/dL (ref 6–20)
CALCIUM: 9.1 mg/dL (ref 8.9–10.3)
CHLORIDE: 106 mmol/L (ref 101–111)
CO2: 25 mmol/L (ref 22–32)
Creatinine, Ser: 1.07 mg/dL — ABNORMAL HIGH (ref 0.44–1.00)
GFR calc Af Amer: >60 mL/min (ref >60)
GFR, EST NON AFRICAN AMERICAN: 59 mL/min — AB (ref >60)
GLUCOSE: 118 mg/dL — AB (ref 65–99)
POTASSIUM: 3.7 mmol/L (ref 3.5–5.1)
Sodium: 140 mmol/L (ref 135–145)
Total Bilirubin: 0.7 mg/dL (ref 0.3–1.2)
Total Protein: 8 g/dL (ref 6.5–8.1)

## 2016-02-29 LAB — RAPID URINE DRUG SCREEN, HOSP PERFORMED
Amphetamines: NOT DETECTED
Barbiturates: NOT DETECTED
Benzodiazepines: POSITIVE — AB
COCAINE: POSITIVE — AB
OPIATES: NOT DETECTED
TETRAHYDROCANNABINOL: NOT DETECTED

## 2016-02-29 LAB — CBG MONITORING, ED
Glucose-Capillary: 111 mg/dL — ABNORMAL HIGH (ref 65–99)
Glucose-Capillary: 109 mg/dL — ABNORMAL HIGH (ref 65–99)

## 2016-02-29 LAB — ETHANOL: Alcohol, Ethyl (B): <5 mg/dL (ref <5)

## 2016-02-29 LAB — SALICYLATE LEVEL

## 2016-02-29 LAB — ACETAMINOPHEN LEVEL

## 2016-02-29 LAB — CBC
HCT: 36.9 % (ref 36.0–46.0)
HEMOGLOBIN: 11.5 g/dL — AB (ref 12.0–15.0)
MCH: 28.4 pg (ref 26.0–34.0)
MCHC: 31.2 g/dL (ref 30.0–36.0)
MCV: 91.1 fL (ref 78.0–100.0)
PLATELETS: 274 10*3/uL (ref 150–400)
RBC: 4.05 MIL/uL (ref 3.87–5.11)
RDW: 13.9 % (ref 11.5–15.5)
WBC: 6.2 10*3/uL (ref 4.0–10.5)

## 2016-02-29 MED ORDER — AMLODIPINE BESYLATE 5 MG PO TABS
5.0000 mg | ORAL_TABLET | Freq: Every day | ORAL | Status: DC
  Administered 2016-02-29 – 2016-03-02 (×3): 5 mg via ORAL
  Filled 2016-02-29 (×3): qty 1

## 2016-02-29 MED ORDER — LINAGLIPTIN 5 MG PO TABS
5.0000 mg | ORAL_TABLET | Freq: Every day | ORAL | Status: DC
  Administered 2016-02-29 – 2016-03-02 (×3): 5 mg via ORAL
  Filled 2016-02-29 (×3): qty 1

## 2016-02-29 MED ORDER — BENZTROPINE MESYLATE 1 MG PO TABS
0.5000 mg | ORAL_TABLET | Freq: Every day | ORAL | Status: DC
  Administered 2016-02-29 – 2016-03-01 (×2): 0.5 mg via ORAL
  Filled 2016-02-29 (×2): qty 1

## 2016-02-29 MED ORDER — ACETAMINOPHEN 325 MG PO TABS
650.0000 mg | ORAL_TABLET | Freq: Once | ORAL | Status: AC
  Administered 2016-02-29: 650 mg via ORAL
  Filled 2016-02-29: qty 2

## 2016-02-29 MED ORDER — GABAPENTIN 400 MG PO CAPS
400.0000 mg | ORAL_CAPSULE | Freq: Three times a day (TID) | ORAL | Status: DC
  Administered 2016-02-29 – 2016-03-02 (×8): 400 mg via ORAL
  Filled 2016-02-29 (×8): qty 1

## 2016-02-29 MED ORDER — ATORVASTATIN CALCIUM 40 MG PO TABS
40.0000 mg | ORAL_TABLET | Freq: Every day | ORAL | Status: DC
  Administered 2016-02-29 – 2016-03-02 (×3): 40 mg via ORAL
  Filled 2016-02-29 (×3): qty 1

## 2016-02-29 MED ORDER — INSULIN GLARGINE 100 UNIT/ML ~~LOC~~ SOLN
30.0000 [IU] | Freq: Every day | SUBCUTANEOUS | Status: DC
  Administered 2016-02-29: 30 [IU] via SUBCUTANEOUS
  Filled 2016-02-29 (×3): qty 0.3

## 2016-02-29 MED ORDER — METFORMIN HCL 500 MG PO TABS
1000.0000 mg | ORAL_TABLET | Freq: Two times a day (BID) | ORAL | Status: DC
  Administered 2016-02-29 – 2016-03-02 (×5): 1000 mg via ORAL
  Filled 2016-02-29 (×5): qty 2

## 2016-02-29 MED ORDER — ATENOLOL 100 MG PO TABS
100.0000 mg | ORAL_TABLET | Freq: Every day | ORAL | Status: DC
  Administered 2016-02-29 – 2016-03-02 (×3): 100 mg via ORAL
  Filled 2016-02-29 (×3): qty 1

## 2016-02-29 MED ORDER — DIVALPROEX SODIUM 250 MG PO DR TAB
250.0000 mg | DELAYED_RELEASE_TABLET | Freq: Three times a day (TID) | ORAL | Status: DC
  Administered 2016-02-29 – 2016-03-01 (×3): 250 mg via ORAL
  Filled 2016-02-29 (×3): qty 1

## 2016-02-29 NOTE — ED Notes (Signed)
Patient noted sleeping in room. No complaints, stable, in no acute distress. Q15 minute rounds and monitoring via Security Cam[employer] to continue.  
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
## 2016-02-29 NOTE — ED Notes (Signed)
Consulted with Dr. Jeneen Rinks concerning CBG and Lantus. Will give Lantus and check CBG at 0200.

## 2016-02-29 NOTE — ED Notes (Signed)
Report to include situation, background, assessment and recommendations from Diane RN. Patient sleeping, respirations regular and unlabored. Q15 minute rounds and security camera observation to continue.   

## 2016-02-29 NOTE — ED Triage Notes (Signed)
Pt has been wanded, Belongings: Pants, shirt, bra, tennis shoes

## 2016-02-29 NOTE — ED Notes (Signed)
Snack of Graham crackers and peanut butter and beverage given.

## 2016-02-29 NOTE — ED Provider Notes (Signed)
[facility] DEPT Provider Note   CSN: [id number] Arrival date & time: 02/29/16  0856     History   Chief Complaint Chief Complaint  Patient presents with  . Suicidal  . Depression    HPI DEZIYA DIERSEN is a 50 y.o. female.  HPI CC: SI with recent OD attempt  Onset/Duration: 2 weeks Timing: intermittent Quality: SI Severity: moderate Modifying Factors:  Improved by: nothing  Worsened by: arguments with boyfriend Associated Signs/Symptoms:  Pertinent (+): AVH saying to "step in front of cars, hurt my boyfriend, do myself in."  Pertinent (-): HI Context: was recently admitted to [facility] for OD, think she was DC'd too early. States she has not been taking any of her medications  Past Medical History:  Diagnosis Date  . Anxiety   . Arthritis    back and knees  . Asthma    daily and prn inhalers  . 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 Bipolar 1 disorder ([place])   . CHF (congestive heart failure) ([place])   . Depression   . Diabetes mellitus    diet-controlled  . Gastric ulcer   . GERD (gastroesophageal reflux disease)   . Gout   . Headache(784.0)    migraines  . High cholesterol   . Hypertension    under control, has been on med. x 12 yrs.  . Substance abuse    crack cocaine  . TMJ (temporomandibular joint disorder)     Patient Active Problem List   Diagnosis Date Noted  . Hypomagnesemia   . Cannabis use disorder, moderate, dependence ([place]) 11/20/2015  . Homicidal ideation   . Tobacco use disorder 07/20/2015  . Diabetes mellitus ([place]) 07/20/2015  . Cocaine use disorder, moderate, dependence ([place]) 04/03/2015  . Schizoaffective disorder, bipolar type ([place]) 04/03/2015  . Alcohol use disorder, moderate, dependence ([place]) 04/03/2015  . Acute ischemic stroke ([place]) 12/08/2014  . Left-sided weakness 12/08/2014  . 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  . Knee pain 10/17/2010  . MAMMOGRAM, ABNORMAL, RIGHT 08/05/2010  . Dyslipidemia  06/21/2010  . AMENORRHEA, SECONDARY 10/29/2009  . HERPES ZOSTER 08/20/2009  . GERD 10/11/2007  . Essential hypertension 02/26/2007    Past Surgical History:  Procedure Laterality Date  . BREAST LUMPECTOMY WITH NEEDLE LOCALIZATION  04/19/2012   Procedure: BREAST LUMPECTOMY WITH NEEDLE LOCALIZATION;  Surgeon: [name], MD;  Location: [place];  Service: General;  Laterality: Right;  . KNEE ARTHROSCOPY W/ PARTIAL MEDIAL MENISCECTOMY  05/01/2010   right    OB History    Gravida Para Term Preterm AB Living   3 2 2   1 2    SAB TAB Ectopic Multiple Live Births   1               Home Medications    Prior to Admission medications   Medication Sig Start Date End Date Taking? Authorizing Provider  amLODipine (NORVASC) 5 MG tablet Take 1 tablet (5 mg total) by mouth daily. 02/25/16  Yes Kerrie Buffalo, NP  ARIPiprazole ER 400 MG SUSR Inject 400 mg into the muscle every 28 (twenty-eight) days. Due next Oct 4.  Last dose was given Sept 6. 03/19/16  Yes Kerrie Buffalo, NP  atenolol (TENORMIN) 100 MG tablet Take 1 tablet (100 mg total) by mouth daily. 02/25/16  Yes Kerrie Buffalo, NP  atorvastatin (LIPITOR) 40 MG tablet Take 1 tablet (40 mg total) by mouth daily at 6 PM. 02/25/16  Yes Kerrie Buffalo,  NP  benztropine (COGENTIN) 0.5 MG tablet Take 1 tablet (0.5 mg total) by mouth at bedtime. 02/25/16  Yes Kerrie Buffalo, NP  divalproex (DEPAKOTE) 250 MG DR tablet Take 1 tablet (250 mg total) by mouth every 8 (eight) hours. 02/25/16  Yes Kerrie Buffalo, NP  gabapentin (NEURONTIN) 400 MG capsule Take 1 capsule (400 mg total) by mouth 3 (three) times daily. 02/25/16  Yes Kerrie Buffalo, NP  hydrOXYzine (ATARAX/VISTARIL) 25 MG tablet Take 1 tablet (25 mg total) by mouth 3 (three) times daily as needed for anxiety. 02/25/16  Yes Kerrie Buffalo, NP  insulin glargine (LANTUS) 100 UNIT/ML injection Inject 30 Units into the skin at bedtime.   Yes Historical Provider, MD  linagliptin  (TRADJENTA) 5 MG TABS tablet Take 1 tablet (5 mg total) by mouth daily. 02/25/16  Yes Kerrie Buffalo, NP  metFORMIN (GLUCOPHAGE) 1000 MG tablet Take 1 tablet (1,000 mg total) by mouth 2 (two) times daily with a meal. 02/25/16  Yes Kerrie Buffalo, NP  Multiple Vitamin (MULTIVITAMIN WITH MINERALS) TABS tablet Take 1 tablet by mouth daily.   Yes Historical Provider, MD  perphenazine (TRILAFON) 2 MG tablet Take 1 tablet (2 mg total) by mouth 2 (two) times daily in the am and at bedtime.. 02/25/16  Yes Kerrie Buffalo, NP  traZODone (DESYREL) 150 MG tablet Take 1 tablet (150 mg total) by mouth at bedtime. 02/25/16  Yes Kerrie Buffalo, NP    Family History Family History  Problem Relation Age of Onset  . Diabetes Mother   . Breast cancer Mother   . Cancer Father   . Bipolar disorder Maternal Aunt   . Schizophrenia Maternal Grandfather   . Alcoholism Maternal Uncle     Social History Social History  Substance Use Topics  . Smoking status: Current Every Day Smoker    Packs/day: 1.00    Years: 0.00    Types: Cigarettes  . Smokeless tobacco: Never Used  . Alcohol use 0.0 oz/week     Comment: reports she drinks 3 40 ounce beers daily     Allergies   Chocolate; Orange; Penicillins; Other; and Tomato   Review of Systems Review of Systems  Constitutional: Negative for chills, fatigue and fever.  HENT: Negative for congestion and sore throat.   Eyes: Negative for visual disturbance.  Respiratory: Negative for cough, chest tightness and shortness of breath.   Cardiovascular: Negative for chest pain and palpitations.  Gastrointestinal: Negative for abdominal pain, blood in stool, diarrhea, nausea and vomiting.  Genitourinary: Negative for decreased urine volume and difficulty urinating.  Musculoskeletal: Negative for back pain and neck stiffness.       Chronic right ankle pain  Skin: Negative for rash.  Neurological: Negative for light-headedness and headaches.  Psychiatric/Behavioral:  Negative for confusion.  All other systems reviewed and are negative.    Physical Exam Updated Vital Signs BP 144/100 (BP Location: Left Arm)   Temp 98.3 F (36.8 C) (Oral)   Resp 16   Ht 5\' 4"  (1.626 m)   Wt 214 lb (97.1 kg)   LMP  (Approximate) Comment: 2010  SpO2 96%   BMI 36.73 kg/m   Physical Exam  Constitutional: She is oriented to person, place, and time. She appears well-developed and well-nourished. No distress.  HENT:  Head: Normocephalic and atraumatic.  Nose: Nose normal.  Eyes: Conjunctivae and EOM are normal. Pupils are equal, round, and reactive to light. Right eye exhibits no discharge. Left eye exhibits no discharge. No scleral icterus.  Neck: Normal  range of motion. Neck supple.  Cardiovascular: Normal rate and regular rhythm.  Exam reveals no gallop and no friction rub.   No murmur heard. Pulmonary/Chest: Effort normal and breath sounds normal. No stridor. No respiratory distress. She has no rales.  Abdominal: Soft. She exhibits no distension. There is no tenderness.  Musculoskeletal: She exhibits no edema or tenderness.       Right ankle: She exhibits no swelling and no ecchymosis.  Neurological: She is alert and oriented to person, place, and time.  Skin: Skin is warm and dry. No rash noted. She is not diaphoretic. No erythema.  Psychiatric: She has a normal mood and affect.  Vitals reviewed.    ED Treatments / Results  Labs (all labs ordered are listed, but only abnormal results are displayed) Labs Reviewed  COMPREHENSIVE METABOLIC PANEL - Abnormal; Notable for the following:       Result Value   Glucose, Bld 118 (*)    Creatinine, Ser 1.07 (*)    GFR calc non Af Amer 59 (*)    All other components within normal limits  ACETAMINOPHEN LEVEL - Abnormal; Notable for the following:    Acetaminophen (Tylenol), Serum <10 (*)    All other components within normal limits  CBC - Abnormal; Notable for the following:    Hemoglobin 11.5 (*)    All other  components within normal limits  URINE RAPID DRUG SCREEN, HOSP PERFORMED - Abnormal; Notable for the following:    Cocaine POSITIVE (*)    Benzodiazepines POSITIVE (*)    All other components within normal limits  CBG MONITORING, ED - Abnormal; Notable for the following:    Glucose-Capillary 111 (*)    All other components within normal limits  ETHANOL  SALICYLATE LEVEL    EKG  EKG Interpretation None       Radiology No results found.  Procedures Procedures (including critical care time)  Medications Ordered in ED Medications  amLODipine (NORVASC) tablet 5 mg (5 mg Oral Given 02/29/16 1108)  atenolol (TENORMIN) tablet 100 mg (100 mg Oral Given 02/29/16 1108)  atorvastatin (LIPITOR) tablet 40 mg (not administered)  benztropine (COGENTIN) tablet 0.5 mg (not administered)  divalproex (DEPAKOTE) DR tablet 250 mg (250 mg Oral Given 02/29/16 1406)  gabapentin (NEURONTIN) capsule 400 mg (400 mg Oral Given 02/29/16 1108)  metFORMIN (GLUCOPHAGE) tablet 1,000 mg (not administered)  linagliptin (TRADJENTA) tablet 5 mg (5 mg Oral Given 02/29/16 1108)  insulin glargine (LANTUS) injection 30 Units (not administered)  acetaminophen (TYLENOL) tablet 650 mg (650 mg Oral Given 02/29/16 0958)     Initial Impression / Assessment and Plan / ED Course  I have reviewed the triage vital signs and the nursing notes.  Pertinent labs & imaging results that were available during my care of the patient were reviewed by me and considered in my medical decision making (see chart for details).  Clinical Course    Suicidal ideations with recent admission for suicide attempt via overdosing. Patient also endorsing auditory hallucinations.  Patient has a history of diabetes and hypertension and has not been taking her meds. Labs reassuring without evidence of DKA. Medically cleared. Psychiatry consulted and currently awaiting their recommendations.  Final Clinical Impressions(s) / ED Diagnoses   Final  diagnoses:  Suicidal ideation      Fatima Blank, MD 02/29/16 [phone]

## 2016-02-29 NOTE — ED Notes (Signed)
Bed: [facility] Expected date:  Expected time:  Means of arrival:  Comments: 

## 2016-02-29 NOTE — Progress Notes (Signed)
02/29/16 1340:  LRT went to meet with pt, pt was sleep.  Victorino Sparrow, LRT/CTRS

## 2016-02-29 NOTE — ED Triage Notes (Addendum)
Pt brought in by [employer], multiple comp;aints, depression, "everything" "want to kill myself" plan is to cut with "something sharp", feels she was relased to [place] last week.

## 2016-03-01 DIAGNOSIS — F250 Schizoaffective disorder, bipolar type: Secondary | ICD-10-CM

## 2016-03-01 DIAGNOSIS — R45851 Suicidal ideations: Secondary | ICD-10-CM | POA: Diagnosis not present

## 2016-03-01 LAB — CBG MONITORING, ED
GLUCOSE-CAPILLARY: 111 mg/dL — AB (ref 65–99)
GLUCOSE-CAPILLARY: 88 mg/dL (ref 65–99)
Glucose-Capillary: 102 mg/dL — ABNORMAL HIGH (ref 65–99)
Glucose-Capillary: 91 mg/dL (ref 65–99)
Glucose-Capillary: 96 mg/dL (ref 65–99)

## 2016-03-01 MED ORDER — INSULIN GLARGINE 100 UNIT/ML ~~LOC~~ SOLN
20.0000 [IU] | Freq: Once | SUBCUTANEOUS | Status: AC
  Administered 2016-03-01: 20 [IU] via SUBCUTANEOUS
  Filled 2016-03-01: qty 0.2

## 2016-03-01 MED ORDER — DIVALPROEX SODIUM 500 MG PO DR TAB
500.0000 mg | DELAYED_RELEASE_TABLET | Freq: Two times a day (BID) | ORAL | Status: DC
  Administered 2016-03-01 – 2016-03-02 (×2): 500 mg via ORAL
  Filled 2016-03-01 (×2): qty 1

## 2016-03-01 MED ORDER — ARIPIPRAZOLE 5 MG PO TABS
5.0000 mg | ORAL_TABLET | Freq: Every day | ORAL | Status: DC
  Administered 2016-03-02: 5 mg via ORAL
  Filled 2016-03-01: qty 1

## 2016-03-01 NOTE — ED Notes (Signed)
Patient noted in room. No complaints, stable, in no acute distress. Q15 minute rounds and monitoring via Security Cameras to continue.  

## 2016-03-01 NOTE — ED Notes (Signed)
Up to the bathroom 

## 2016-03-01 NOTE — ED Notes (Signed)
Report to include Situation, Background, Assessment, and Recommendations received from Janie Rambo RN. Patient alert and oriented, warm and dry, in no acute distress. Patient denies SI, HI, AVH and pain. Patient made aware of Q15 minute rounds and security cameras for their safety. Patient instructed to come to me with needs or concerns.  

## 2016-03-01 NOTE — Progress Notes (Signed)
Disposition CSW completed patient referrals to the following inpatient psych facilities:  [facility]  CSW will continue to follow the patient for placement needs.  [facility] Disposition CSW [phone]
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
## 2016-03-01 NOTE — ED Notes (Signed)
Patient noted sleeping in room. No complaints, stable, in no acute distress. Q15 minute rounds and monitoring via Security Cam[employer] to continue.  

## 2016-03-01 NOTE — ED Notes (Signed)
Sandwich, graham crackers and peanut butter and sprite given.

## 2016-03-01 NOTE — ED Notes (Signed)
Dr Dwain Sarna NP into see.  Pt reports that she is "depressed...bipolar...want to commit suicide..."  Pt reports that she was in the hospital [facility]) 1.5 weeks ago and after dc she has been trying to get in touch with her ACT team.  Pt reports that she has been taking her meds, but not regularly.

## 2016-03-01 NOTE — ED Notes (Signed)
Up to the bathroom to shower

## 2016-03-02 ENCOUNTER — Inpatient Hospital Stay (HOSPITAL_COMMUNITY)
Admission: AD | Admit: 2016-03-02 | Discharge: 2016-03-14 | DRG: 885 | Disposition: A | Discharge status: Home / Self Care | Payer: Medicaid Other | Source: Intra-hospital | Attending: Psychiatry | Admitting: Psychiatry

## 2016-03-02 ENCOUNTER — Encounter (HOSPITAL_COMMUNITY): Payer: Self-pay

## 2016-03-02 DIAGNOSIS — E119 Type 2 diabetes mellitus without complications: Secondary | ICD-10-CM | POA: Diagnosis present

## 2016-03-02 DIAGNOSIS — F1420 Cocaine dependence, uncomplicated: Secondary | ICD-10-CM | POA: Diagnosis present

## 2016-03-02 DIAGNOSIS — F1020 Alcohol dependence, uncomplicated: Secondary | ICD-10-CM | POA: Diagnosis present

## 2016-03-02 DIAGNOSIS — Y900 Blood alcohol level of less than 20 mg/100 ml: Secondary | ICD-10-CM | POA: Diagnosis present

## 2016-03-02 DIAGNOSIS — F329 Major depressive disorder, single episode, unspecified: Secondary | ICD-10-CM | POA: Diagnosis present

## 2016-03-02 DIAGNOSIS — F17210 Nicotine dependence, cigarettes, uncomplicated: Secondary | ICD-10-CM | POA: Diagnosis present

## 2016-03-02 DIAGNOSIS — R45851 Suicidal ideations: Secondary | ICD-10-CM | POA: Diagnosis present

## 2016-03-02 DIAGNOSIS — Z833 Family history of diabetes mellitus: Secondary | ICD-10-CM | POA: Diagnosis not present

## 2016-03-02 DIAGNOSIS — Z23 Encounter for immunization: Secondary | ICD-10-CM | POA: Diagnosis not present

## 2016-03-02 DIAGNOSIS — Z915 Personal history of self-harm: Secondary | ICD-10-CM | POA: Diagnosis not present

## 2016-03-02 DIAGNOSIS — Z794 Long term (current) use of insulin: Secondary | ICD-10-CM

## 2016-03-02 DIAGNOSIS — Z79899 Other long term (current) drug therapy: Secondary | ICD-10-CM

## 2016-03-02 DIAGNOSIS — Z803 Family history of malignant neoplasm of breast: Secondary | ICD-10-CM

## 2016-03-02 DIAGNOSIS — F250 Schizoaffective disorder, bipolar type: Principal | ICD-10-CM | POA: Diagnosis present

## 2016-03-02 DIAGNOSIS — I10 Essential (primary) hypertension: Secondary | ICD-10-CM | POA: Diagnosis present

## 2016-03-02 DIAGNOSIS — Z8673 Personal history of transient ischemic attack (TIA), and cerebral infarction without residual deficits: Secondary | ICD-10-CM

## 2016-03-02 DIAGNOSIS — F17200 Nicotine dependence, unspecified, uncomplicated: Secondary | ICD-10-CM | POA: Diagnosis present

## 2016-03-02 DIAGNOSIS — E7800 Pure hypercholesterolemia, unspecified: Secondary | ICD-10-CM | POA: Diagnosis present

## 2016-03-02 DIAGNOSIS — F5105 Insomnia due to other mental disorder: Secondary | ICD-10-CM | POA: Diagnosis present

## 2016-03-02 DIAGNOSIS — F1220 Cannabis dependence, uncomplicated: Secondary | ICD-10-CM | POA: Diagnosis present

## 2016-03-02 DIAGNOSIS — Z818 Family history of other mental and behavioral disorders: Secondary | ICD-10-CM

## 2016-03-02 LAB — CBG MONITORING, ED
GLUCOSE-CAPILLARY: 95 mg/dL (ref 65–99)
GLUCOSE-CAPILLARY: 126 mg/dL — AB (ref 65–99)
Glucose-Capillary: 92 mg/dL (ref 65–99)
Glucose-Capillary: 99 mg/dL (ref 65–99)

## 2016-03-02 MED ORDER — ALUM & MAG HYDROXIDE-SIMETH 200-200-20 MG/5ML PO SUSP
30.0000 mL | ORAL | Status: DC | PRN
  Administered 2016-03-03: 30 mL via ORAL
  Filled 2016-03-02: qty 30

## 2016-03-02 MED ORDER — INFLUENZA VAC SPLIT QUAD 0.5 ML IM SUSY
0.5000 mL | PREFILLED_SYRINGE | INTRAMUSCULAR | Status: AC
  Administered 2016-03-03: 0.5 mL via INTRAMUSCULAR
  Filled 2016-03-02: qty 0.5

## 2016-03-02 MED ORDER — ATENOLOL 50 MG PO TABS
100.0000 mg | ORAL_TABLET | Freq: Every day | ORAL | Status: DC
  Administered 2016-03-03 – 2016-03-14 (×12): 100 mg via ORAL
  Filled 2016-03-02 (×3): qty 1
  Filled 2016-03-02: qty 14
  Filled 2016-03-02 (×3): qty 1
  Filled 2016-03-02: qty 14
  Filled 2016-03-02 (×6): qty 1

## 2016-03-02 MED ORDER — DIVALPROEX SODIUM 500 MG PO DR TAB
500.0000 mg | DELAYED_RELEASE_TABLET | Freq: Two times a day (BID) | ORAL | Status: DC
  Administered 2016-03-02 – 2016-03-14 (×24): 500 mg via ORAL
  Filled 2016-03-02 (×3): qty 1
  Filled 2016-03-02: qty 14
  Filled 2016-03-02 (×12): qty 1
  Filled 2016-03-02: qty 14
  Filled 2016-03-02 (×9): qty 1
  Filled 2016-03-02 (×2): qty 14
  Filled 2016-03-02: qty 1

## 2016-03-02 MED ORDER — METFORMIN HCL 500 MG PO TABS
1000.0000 mg | ORAL_TABLET | Freq: Two times a day (BID) | ORAL | Status: DC
Start: 2016-03-03 — End: 2016-03-14
  Administered 2016-03-03 – 2016-03-14 (×23): 1000 mg via ORAL
  Filled 2016-03-02 (×2): qty 2
  Filled 2016-03-02: qty 28
  Filled 2016-03-02 (×7): qty 2
  Filled 2016-03-02 (×2): qty 28
  Filled 2016-03-02 (×11): qty 2
  Filled 2016-03-02: qty 28
  Filled 2016-03-02 (×3): qty 2

## 2016-03-02 MED ORDER — LINAGLIPTIN 5 MG PO TABS
5.0000 mg | ORAL_TABLET | Freq: Every day | ORAL | Status: DC
  Administered 2016-03-03 – 2016-03-14 (×12): 5 mg via ORAL
  Filled 2016-03-02 (×2): qty 7
  Filled 2016-03-02 (×12): qty 1

## 2016-03-02 MED ORDER — AMLODIPINE BESYLATE 5 MG PO TABS
5.0000 mg | ORAL_TABLET | Freq: Every day | ORAL | Status: DC
Start: 2016-03-03 — End: 2016-03-14
  Administered 2016-03-03 – 2016-03-14 (×12): 5 mg via ORAL
  Filled 2016-03-02 (×6): qty 1
  Filled 2016-03-02: qty 7
  Filled 2016-03-02 (×5): qty 1
  Filled 2016-03-02: qty 7
  Filled 2016-03-02: qty 1

## 2016-03-02 MED ORDER — MAGNESIUM HYDROXIDE 400 MG/5ML PO SUSP: 30.0000 mL | Freq: Every day | ORAL | Status: DC | PRN

## 2016-03-02 MED ORDER — BENZTROPINE MESYLATE 0.5 MG PO TABS
0.5000 mg | ORAL_TABLET | Freq: Every day | ORAL | Status: DC
  Administered 2016-03-02 – 2016-03-13 (×12): 0.5 mg via ORAL
  Filled 2016-03-02 (×5): qty 1
  Filled 2016-03-02: qty 7
  Filled 2016-03-02 (×3): qty 1
  Filled 2016-03-02: qty 7
  Filled 2016-03-02 (×5): qty 1

## 2016-03-02 MED ORDER — GABAPENTIN 400 MG PO CAPS
400.0000 mg | ORAL_CAPSULE | Freq: Three times a day (TID) | ORAL | Status: DC
  Administered 2016-03-03 – 2016-03-14 (×35): 400 mg via ORAL
  Filled 2016-03-02 (×2): qty 21
  Filled 2016-03-02: qty 1
  Filled 2016-03-02: qty 21
  Filled 2016-03-02 (×6): qty 1
  Filled 2016-03-02: qty 21
  Filled 2016-03-02 (×2): qty 1
  Filled 2016-03-02: qty 21
  Filled 2016-03-02 (×19): qty 1
  Filled 2016-03-02: qty 21
  Filled 2016-03-02 (×6): qty 1

## 2016-03-02 MED ORDER — ACETAMINOPHEN 325 MG PO TABS
650.0000 mg | ORAL_TABLET | Freq: Four times a day (QID) | ORAL | Status: DC | PRN
  Administered 2016-03-04 – 2016-03-13 (×3): 650 mg via ORAL
  Filled 2016-03-02 (×3): qty 2

## 2016-03-02 MED ORDER — ACETAMINOPHEN 325 MG PO TABS
650.0000 mg | ORAL_TABLET | Freq: Four times a day (QID) | ORAL | Status: DC | PRN
  Administered 2016-03-02: 650 mg via ORAL
  Filled 2016-03-02: qty 2

## 2016-03-02 MED ORDER — ARIPIPRAZOLE 5 MG PO TABS
5.0000 mg | ORAL_TABLET | Freq: Every day | ORAL | Status: DC
  Administered 2016-03-03: 5 mg via ORAL
  Filled 2016-03-02 (×2): qty 1

## 2016-03-02 MED ORDER — TRAZODONE HCL 150 MG PO TABS
150.0000 mg | ORAL_TABLET | Freq: Every day | ORAL | Status: DC
  Administered 2016-03-02 – 2016-03-03 (×2): 150 mg via ORAL
  Filled 2016-03-02 (×3): qty 1

## 2016-03-02 MED ORDER — ATORVASTATIN CALCIUM 40 MG PO TABS
40.0000 mg | ORAL_TABLET | Freq: Every day | ORAL | Status: DC
  Administered 2016-03-03 – 2016-03-13 (×11): 40 mg via ORAL
  Filled 2016-03-02 (×8): qty 1
  Filled 2016-03-02 (×2): qty 7
  Filled 2016-03-02 (×3): qty 1

## 2016-03-02 MED ORDER — INSULIN GLARGINE 100 UNIT/ML ~~LOC~~ SOLN
30.0000 [IU] | Freq: Every day | SUBCUTANEOUS | Status: DC
  Administered 2016-03-02 – 2016-03-03 (×2): 30 [IU] via SUBCUTANEOUS
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
## 2016-03-02 NOTE — BH Assessment (Signed)
[place] Assessment Progress Note  This Probation officer spoke with patient this date to evaluate current mental status. Patient presents with a very depressed affect and is slow to respond to this writer's questions. Patient speaks in a low voice and is difficult to understand. Pt reported that she has recently become homeless and has "no reason to continue living." Pt reported that today she feels suicidal with plan to overdose or walk into traffic if released. Patient also reports on going sadness, insomnia, and auditory hallucinations. Patient states she continues to hear voices that tell her to harm herself. Patient was vague in reference to her treatment history. Patient also stated she needed help with her SA use reporting ongoing use of cocaine. Patient stated she is prescribed various medications, but that she does not take her medications because her situation is "hopeless." Patient's  insight and judgment were poor. Case was staffed with Darleene Cleaver MD who stated patient continued to meet criteria for inpatient treatment.

## 2016-03-02 NOTE — Tx Team (Signed)
Initial Treatment Plan 03/02/2016 10:50 PM Maureen Swanson [id number]    PATIENT STRESSORS: Marital or family conflict Medication change or noncompliance Substance abuse   PATIENT STRENGTHS: General fund of knowledge Motivation for treatment/growth   PATIENT IDENTIFIED PROBLEMS: "get back on my medicine"  "stop trying to hurt myself over a man"                   DISCHARGE CRITERIA:  Improved stabilization in mood, thinking, and/or behavior Motivation to continue treatment in a less acute level of care Verbal commitment to aftercare and medication compliance  PRELIMINARY DISCHARGE PLAN: Attend aftercare/continuing care group Attend 12-step recovery group Placement in alternative living arrangements  PATIENT/FAMILY INVOLVEMENT: This treatment plan has been presented to and reviewed with the patient, Maureen Swanson, and/or family member, .  The patient and family have been given the opportunity to ask questions and make suggestions.  Migdalia Dk, RN 03/02/2016, 10:50 PM

## 2016-03-02 NOTE — ED Notes (Addendum)
Patient noted sleeping in room. No complaints, stable, in no acute distress. Q15 minute rounds and monitoring via Security Cameras to continue.  

## 2016-03-02 NOTE — ED Notes (Signed)
Patient report was given to [facility], patient to go to [address] at 8 pm.

## 2016-03-02 NOTE — ED Notes (Signed)
Patient noted sleeping in room. No complaints, stable, in no acute distress. Q15 minute rounds and monitoring via Security Cam[employer] to continue.  

## 2016-03-02 NOTE — ED Notes (Signed)
Pt. Given graham crackers and peanut butter ans sprite.

## 2016-03-02 NOTE — ED Notes (Signed)
Patient report given to Honor Loh RN

## 2016-03-02 NOTE — ED Notes (Signed)
Report to include Situation, Background, Assessment, and Recommendations received from [facility]. Patient alert and oriented, warm and dry, in no acute distress. Patient denies SI, HI, AVH and pain. Patient made aware of Q15 minute rounds and security cameras for their safety. Patient instructed to come to me with needs or concerns.

## 2016-03-03 ENCOUNTER — Encounter (HOSPITAL_COMMUNITY): Payer: Self-pay | Admitting: Psychiatry

## 2016-03-03 DIAGNOSIS — R45851 Suicidal ideations: Secondary | ICD-10-CM

## 2016-03-03 DIAGNOSIS — F250 Schizoaffective disorder, bipolar type: Principal | ICD-10-CM

## 2016-03-03 LAB — GLUCOSE, CAPILLARY
GLUCOSE-CAPILLARY: 94 mg/dL (ref 65–99)
GLUCOSE-CAPILLARY: 180 mg/dL — AB (ref 65–99)
GLUCOSE-CAPILLARY: 101 mg/dL — AB (ref 65–99)
Glucose-Capillary: 93 mg/dL (ref 65–99)

## 2016-03-03 MED ORDER — LOPERAMIDE HCL 2 MG PO CAPS: 2.0000 mg | ORAL_CAPSULE | ORAL | Status: AC | PRN

## 2016-03-03 MED ORDER — HYDROXYZINE HCL 25 MG PO TABS
25.0000 mg | ORAL_TABLET | Freq: Four times a day (QID) | ORAL | Status: AC | PRN
  Administered 2016-03-04 – 2016-03-06 (×3): 25 mg via ORAL
  Filled 2016-03-03: qty 1
  Filled 2016-03-03: qty 10
  Filled 2016-03-03 (×2): qty 1

## 2016-03-03 MED ORDER — CHLORDIAZEPOXIDE HCL 25 MG PO CAPS
25.0000 mg | ORAL_CAPSULE | Freq: Four times a day (QID) | ORAL | Status: AC | PRN
  Administered 2016-03-04: 25 mg via ORAL
  Filled 2016-03-03 (×2): qty 1

## 2016-03-03 MED ORDER — ARIPIPRAZOLE ER 400 MG IM SUSR: 400.0000 mg | INTRAMUSCULAR | Status: DC

## 2016-03-03 MED ORDER — INSULIN ASPART 100 UNIT/ML ~~LOC~~ SOLN
0.0000 [IU] | Freq: Every day | SUBCUTANEOUS | Status: DC
  Administered 2016-03-13: 2 [IU] via SUBCUTANEOUS

## 2016-03-03 MED ORDER — PERPHENAZINE 2 MG PO TABS
2.0000 mg | ORAL_TABLET | ORAL | Status: DC
  Administered 2016-03-03 – 2016-03-04 (×2): 2 mg via ORAL
  Filled 2016-03-03 (×4): qty 1

## 2016-03-03 MED ORDER — OLANZAPINE 5 MG PO TABS
5.0000 mg | ORAL_TABLET | Freq: Three times a day (TID) | ORAL | Status: DC | PRN
  Administered 2016-03-04 – 2016-03-05 (×2): 5 mg via ORAL
  Filled 2016-03-03 (×2): qty 2

## 2016-03-03 MED ORDER — ONDANSETRON 4 MG PO TBDP: 4.0000 mg | ORAL_TABLET | Freq: Four times a day (QID) | ORAL | Status: AC | PRN

## 2016-03-03 MED ORDER — ADULT MULTIVITAMIN W/MINERALS CH
1.0000 | ORAL_TABLET | Freq: Every day | ORAL | Status: DC
Start: 2016-03-03 — End: 2016-03-14
  Administered 2016-03-03 – 2016-03-14 (×12): 1 via ORAL
  Filled 2016-03-03: qty 1
  Filled 2016-03-03: qty 7
  Filled 2016-03-03 (×10): qty 1
  Filled 2016-03-03: qty 7
  Filled 2016-03-03: qty 1

## 2016-03-03 MED ORDER — THIAMINE HCL 100 MG/ML IJ SOLN
100.0000 mg | Freq: Once | INTRAMUSCULAR | Status: AC
  Administered 2016-03-03: 100 mg via INTRAMUSCULAR
  Filled 2016-03-03: qty 2

## 2016-03-03 MED ORDER — OLANZAPINE 10 MG IM SOLR: 5.0000 mg | Freq: Three times a day (TID) | INTRAMUSCULAR | Status: DC | PRN

## 2016-03-03 MED ORDER — HYDROXYZINE HCL 25 MG PO TABS: 25.0000 mg | ORAL_TABLET | Freq: Four times a day (QID) | ORAL | Status: DC | PRN

## 2016-03-03 MED ORDER — INSULIN ASPART 100 UNIT/ML ~~LOC~~ SOLN
0.0000 [IU] | Freq: Three times a day (TID) | SUBCUTANEOUS | Status: DC
  Administered 2016-03-03: 0 [IU] via SUBCUTANEOUS
  Administered 2016-03-03: 3 [IU] via SUBCUTANEOUS
  Administered 2016-03-04 – 2016-03-05 (×3): 2 [IU] via SUBCUTANEOUS
  Administered 2016-03-06: 3 [IU] via SUBCUTANEOUS
  Administered 2016-03-07 (×2): 2 [IU] via SUBCUTANEOUS
  Administered 2016-03-07: 3 [IU] via SUBCUTANEOUS
  Administered 2016-03-08: 2 [IU] via SUBCUTANEOUS
  Administered 2016-03-09: 3 [IU] via SUBCUTANEOUS
  Administered 2016-03-09 – 2016-03-10 (×3): 2 [IU] via SUBCUTANEOUS
  Administered 2016-03-11 – 2016-03-12 (×3): 3 [IU] via SUBCUTANEOUS
  Administered 2016-03-12: 2 [IU] via SUBCUTANEOUS
  Administered 2016-03-13: 5 [IU] via SUBCUTANEOUS

## 2016-03-03 MED ORDER — VITAMIN B-1 100 MG PO TABS
100.0000 mg | ORAL_TABLET | Freq: Every day | ORAL | Status: DC
  Administered 2016-03-04 – 2016-03-14 (×11): 100 mg via ORAL
  Filled 2016-03-03 (×15): qty 1

## 2016-03-03 NOTE — Clinical Social Work Note (Signed)
[facility] notified of patient's admission, requested to meet w patient while in hospital and develop transition plan for greater stability upon discharge.  Edwyna Shell, LCSW Lead Clinical Social Worker Phone:  [phone]

## 2016-03-03 NOTE — Clinical Social Work Note (Signed)
Pt referred to [facility] program for help w chronic homelessness.  Is requesting shelter bed.  Edwyna Shell, LCSW Lead Clinical Social Worker Phone:  [phone]

## 2016-03-03 NOTE — [facility] Group Notes (Signed)
[facility] LCSW Group Therapy  03/03/2016 2:39 PM  Type of Therapy:  Group Therapy  Participation Level:  Minimal  Participation Quality:  Attentive  Affect:  Flat  Cognitive:  Alert  Insight:  Improving  Engagement in Therapy:  Minimal participation,but when she contributed she was on task, goal oriented  Modes of Intervention:  Exploration, Rapport Building and Support  Summary of Progress/Problems:  Group today consisted of building rapport with patients, allowing time to process experience while in hospital, and reviewing hierarchy of needs in effort to feel safe and trust staff. Maureen Swanson was minimally in engaged as she reported she felt safe while in the hospital, but at times feels like she is taken advantage of by her family.  She reports she feels safe, following her treatment plan and unable to relate to other members in the group.  Other members dominated conversation and were very monopolizing of group.    Maureen Swanson 03/03/2016, 2:39 PM

## 2016-03-03 NOTE — Progress Notes (Signed)
Recreation Therapy Notes  INPATIENT RECREATION THERAPY ASSESSMENT  Patient Details Name: Maureen Swanson MRN: [id number] DOB: [birth year] Today's Date: 03/03/2016  Patient Stressors: Other (Comment) (Finances, arguments, living situation)  Pt stated she was here for suicidal thoughts and attempt.  Coping Skills:   Isolate, Arguments, Substance Abuse, Avoidance, Self-Injury, Exercise, Talking, Music  Personal Challenges: Anger, Communication, Concentration, Decision-Making, Expressing Yourself, Problem-Solving, Relationships, Self-Esteem/Confidence, Social Interaction, Stress Management, Substance Abuse, Trusting Others  Leisure Interests (2+):  Individual - TV, Music - Listen  Awareness of Community Resources:  Yes  Community Resources:  Library, [place]  Current Use: No  If no, Barriers?: Other (Comment) ("wrong type of people, don't want to walk  up there")  Patient Strengths:  good listener  Patient Identified Areas of Improvement:  bi-polar, self-esteem  Current Recreation Participation:  None  Patient Goal for Hospitalization:  "feel better than when I came in"  [place] of Residence:  [place] of Residence:  [place]   Current [place] (including self-harm):  No  Current HI:  No  Consent to Intern Participation: N/A   Victorino Sparrow, LRT/CTRS  Victorino Sparrow A 03/03/2016, 3:23 PM

## 2016-03-03 NOTE — BHH Suicide Risk Assessment (Signed)
[facility] Admission Suicide Risk Assessment   Nursing information obtained from:    Demographic factors:    Current Mental Status:    Loss Factors:    Historical Factors:    Risk Reduction Factors:     Total Time spent with patient: 30 minutes Principal Problem: Schizoaffective disorder, bipolar type ([place]) Diagnosis:   Patient Active Problem List   Diagnosis Date Noted  . Hypomagnesemia [E83.42]   . Cannabis use disorder, moderate, dependence ([place]) [F12.20] 11/20/2015  . Homicidal ideation [R45.850]   . Tobacco use disorder [F17.200] 07/20/2015  . Diabetes mellitus ([place]) [E11.9] 07/20/2015  . Cocaine use disorder, moderate, dependence ([place]) [F14.20] 04/03/2015  . Schizoaffective disorder, bipolar type ([place]) [F25.0] 04/03/2015  . Alcohol use disorder, moderate, dependence ([place]) [F10.20] 04/03/2015  . Acute ischemic stroke ([place]) [I63.9] 12/08/2014  . Left-sided weakness [M62.89] 12/08/2014  . 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 Knee pain [M25.569] 10/17/2010  . MAMMOGRAM, ABNORMAL, RIGHT [R92.8] 08/05/2010  . Dyslipidemia [E78.5] 06/21/2010  . AMENORRHEA, SECONDARY [N91.2] 10/29/2009  . 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 GERD [K21.9] 10/11/2007  . Essential hypertension [I10] 02/26/2007   Subjective Data: Please see H&P.   Continued Clinical Symptoms:  Alcohol Use Disorder Identification Test Final Score (AUDIT): 16 The "Alcohol Use Disorders Identification Test", Guidelines for Use in Primary Care, Second Edition.  World Pharmacologist [facility]). Score between 0-7:  no or low risk or alcohol related problems. Score between 8-15:  moderate risk of alcohol related problems. Score between 16-19:  high risk of alcohol related problems. Score 20 or above:  warrants further diagnostic evaluation for alcohol dependence and treatment.   CLINICAL FACTORS:   Alcohol/Substance Abuse/Dependencies Chronic Pain More than one psychiatric diagnosis Unstable or Poor  Therapeutic Relationship Previous Psychiatric Diagnoses and Treatments Medical Diagnoses and Treatments/Surgeries   Musculoskeletal: Strength & Muscle Tone: within normal limits Gait & Station: normal Patient leans: N/A  Psychiatric Specialty Exam: Physical Exam  Nursing note and vitals reviewed.   Review of Systems  Psychiatric/Behavioral: Positive for depression, hallucinations, substance abuse and suicidal ideas. The patient is nervous/anxious and has insomnia.   All other systems reviewed and are negative.   Blood pressure 139/89, pulse 71, temperature 99 F (37.2 C), temperature source Oral, resp. rate 18, height 5\' 4"  (1.626 m), weight 96.6 kg (213 lb).Body mass index is 36.56 kg/m.  Please see H&P.  COGNITIVE FEATURES THAT CONTRIBUTE TO RISK:  Closed-mindedness, Polarized thinking and Thought constriction (tunnel vision)    SUICIDE RISK:   Severe:  Frequent, intense, and enduring suicidal ideation, specific plan, no subjective intent, but some objective markers of intent (i.e., choice of lethal method), the method is accessible, some limited preparatory behavior, evidence of impaired self-control, severe dysphoria/symptomatology, multiple risk factors present, and few if any protective factors, particularly a lack of social support.   PLAN OF CARE: Please see H&P.   I certify that inpatient services furnished can reasonably be expected to improve the patient's condition.  Naara Kelty, MD 03/03/2016, 1:23 PM

## 2016-03-03 NOTE — Tx Team (Signed)
Interdisciplinary Treatment and Diagnostic Plan Update  03/03/2016 Time of Session: 8:45 AM Maureen Swanson MRN: [id number]  Principal Diagnosis: Schizoaffective disorder, bipolar type ([place])  Secondary Diagnoses: Principal Problem:   Schizoaffective disorder, bipolar type ([place]) Active Problems:   Essential hypertension   Cocaine use disorder, moderate, dependence (HCC)   Alcohol use disorder, moderate, dependence (HCC)   Tobacco use disorder   Diabetes mellitus ([place])   Cannabis use disorder, moderate, dependence ([place])   Current Medications:  Current Facility-Administered Medications  Medication Dose Route Frequency Provider Last Rate Last Dose  . acetaminophen (TYLENOL) tablet 650 mg  650 mg Oral Q6H PRN Shuvon B Rankin, NP      . alum & mag hydroxide-simeth (MAALOX/MYLANTA) 200-200-20 MG/5ML suspension 30 mL  30 mL Oral Q4H PRN Shuvon B Rankin, NP   30 mL at 03/03/16 0922  . amLODipine (NORVASC) tablet 5 mg  5 mg Oral Daily Shuvon B Rankin, NP   5 mg at 03/03/16 0845  . [START ON 03/19/2016] ARIPiprazole ER SUSR 400 mg  400 mg Intramuscular Q28 days [name], MD      . atenolol (TENORMIN) tablet 100 mg  100 mg Oral Daily Shuvon B Rankin, NP   100 mg at 03/03/16 0845  . atorvastatin (LIPITOR) tablet 40 mg  40 mg Oral q1800 Shuvon B Rankin, NP      . benztropine (COGENTIN) tablet 0.5 mg  0.5 mg Oral QHS Shuvon B Rankin, NP   0.5 mg at 03/02/16 2257  . chlordiazePOXIDE (LIBRIUM) capsule 25 mg  25 mg Oral Q6H PRN Saramma Eappen, MD      . divalproex (DEPAKOTE) DR tablet 500 mg  500 mg Oral Q12H Shuvon B Rankin, NP   500 mg at 03/03/16 0845  . gabapentin (NEURONTIN) capsule 400 mg  400 mg Oral TID Shuvon B Rankin, NP   400 mg at 03/03/16 0845  . hydrOXYzine (ATARAX/VISTARIL) tablet 25 mg  25 mg Oral Q6H PRN Saramma Eappen, MD      . Influenza vac split quadrivalent PF (FLUARIX) injection 0.5 mL  0.5 mL Intramuscular Tomorrow-1000 Saramma Eappen, MD      . insulin aspart (novoLOG)  injection 0-15 Units  0-15 Units Subcutaneous TID WC [name], MD   0 Units at 03/03/16 1208  . insulin aspart (novoLOG) injection 0-5 Units  0-5 Units Subcutaneous QHS Saramma Eappen, MD      . insulin glargine (LANTUS) injection 30 Units  30 Units Subcutaneous QHS Shuvon B Rankin, NP   30 Units at 03/02/16 2258  . linagliptin (TRADJENTA) tablet 5 mg  5 mg Oral Daily Shuvon B Rankin, NP   5 mg at 03/03/16 0845  . loperamide (IMODIUM) capsule 2-4 mg  2-4 mg Oral PRN [name], MD      . magnesium hydroxide (MILK OF MAGNESIA) suspension 30 mL  30 mL Oral Daily PRN Shuvon B Rankin, NP      . metFORMIN (GLUCOPHAGE) tablet 1,000 mg  1,000 mg Oral BID WC Shuvon B Rankin, NP   1,000 mg at 03/03/16 0845  . multivitamin with minerals tablet 1 tablet  1 tablet Oral Daily Saramma Eappen, MD      . OLANZapine (ZYPREXA) tablet 5 mg  5 mg Oral TID PRN [name], MD       Or  . OLANZapine (ZYPREXA) injection 5 mg  5 mg Intramuscular TID PRN [name], MD      . ondansetron (ZOFRAN-ODT) disintegrating tablet 4 mg  4  mg Oral Q6H PRN [name], MD      . perphenazine (TRILAFON) tablet 2 mg  2 mg Oral BH-qamhs Saramma Eappen, MD      . thiamine (B-1) injection 100 mg  100 mg Intramuscular Once [name], MD      . Derrill Memo ON 03/04/2016] thiamine (VITAMIN B-1) tablet 100 mg  100 mg Oral Daily Saramma Eappen, MD      . traZODone (DESYREL) tablet 150 mg  150 mg Oral QHS Lurena Nida, NP   150 mg at 03/02/16 2257   PTA Medications: Prescriptions Prior to Admission  Medication Sig Dispense Refill Last Dose  . amLODipine (NORVASC) 5 MG tablet Take 1 tablet (5 mg total) by mouth daily. 30 tablet 0 02/28/2016 at Unknown time  . [START ON 03/19/2016] ARIPiprazole ER 400 MG SUSR Inject 400 mg into the muscle every 28 (twenty-eight) days. Due next Oct 4.  Last dose was given Sept 6. 1 each 0 a week  . atenolol (TENORMIN) 100 MG tablet Take 1 tablet (100 mg total) by mouth daily. 30 tablet 0  02/28/2016 at 0900  . atorvastatin (LIPITOR) 40 MG tablet Take 1 tablet (40 mg total) by mouth daily at 6 PM. 30 tablet 0 02/28/2016 at Unknown time  . benztropine (COGENTIN) 0.5 MG tablet Take 1 tablet (0.5 mg total) by mouth at bedtime. 30 tablet 0 02/28/2016 at Unknown time  . divalproex (DEPAKOTE) 250 MG DR tablet Take 1 tablet (250 mg total) by mouth every 8 (eight) hours. 90 tablet 0 02/28/2016 at Unknown time  . gabapentin (NEURONTIN) 400 MG capsule Take 1 capsule (400 mg total) by mouth 3 (three) times daily. 90 capsule 0 02/28/2016 at Unknown time  . hydrOXYzine (ATARAX/VISTARIL) 25 MG tablet Take 1 tablet (25 mg total) by mouth 3 (three) times daily as needed for anxiety. 30 tablet 0 Past Week at Unknown time  . insulin glargine (LANTUS) 100 UNIT/ML injection Inject 30 Units into the skin at bedtime.   02/28/2016 at Unknown time  . linagliptin (TRADJENTA) 5 MG TABS tablet Take 1 tablet (5 mg total) by mouth daily. 30 tablet 0 02/28/2016 at Unknown time  . metFORMIN (GLUCOPHAGE) 1000 MG tablet Take 1 tablet (1,000 mg total) by mouth 2 (two) times daily with a meal. 60 tablet 0 02/28/2016 at Unknown time  . Multiple Vitamin (MULTIVITAMIN WITH MINERALS) TABS tablet Take 1 tablet by mouth daily.   02/28/2016 at Unknown time  . perphenazine (TRILAFON) 2 MG tablet Take 1 tablet (2 mg total) by mouth 2 (two) times daily in the am and at bedtime.. 60 tablet 0 02/28/2016 at Unknown time  . traZODone (DESYREL) 150 MG tablet Take 1 tablet (150 mg total) by mouth at bedtime. 30 tablet 0 02/28/2016 at Unknown time    Treatment Modalities: Medication Management, Group therapy, Case management,  1 to 1 session with clinician, Psychoeducation, Recreational therapy.   Physician Treatment Plan for Primary Diagnosis: Schizoaffective disorder, bipolar type ([place]) Long Term Goal(s): Improvement in symptoms so as ready for discharge   Short Term Goals: Ability to disclose and discuss suicidal ideas and Ability to  identify triggers associated with substance abuse/mental health issues will improve  Medication Management: Evaluate patient's response, side effects, and tolerance of medication regimen.  Therapeutic Interventions: 1 to 1 sessions, Unit Group sessions and Medication administration.  Evaluation of Outcomes: Not Met  Physician Treatment Plan for Secondary Diagnosis: Principal Problem:   Schizoaffective disorder, bipolar type ([place]) Active Problems:  Essential hypertension   Cocaine use disorder, moderate, dependence (HCC)   Alcohol use disorder, moderate, dependence (HCC)   Tobacco use disorder   Diabetes mellitus ([place])   Cannabis use disorder, moderate, dependence ([place])  Long Term Goal(s): Improvement in symptoms so as ready for discharge  Short Term Goals: Ability to disclose and discuss suicidal ideas and Ability to identify triggers associated with substance abuse/mental health issues will improve  Medication Management: Evaluate patient's response, side effects, and tolerance of medication regimen.  Therapeutic Interventions: 1 to 1 sessions, Unit Group sessions and Medication administration.  Evaluation of Outcomes: Not Met   RN Treatment Plan for Primary Diagnosis: Schizoaffective disorder, bipolar type ([place]) Long Term Goal(s): Knowledge of disease and therapeutic regimen to maintain health will improve  Short Term Goals: Ability to disclose and discuss suicidal ideas, Ability to identify and develop effective coping behaviors will improve and Compliance with prescribed medications will improve  Medication Management: RN will administer medications as ordered by provider, will assess and evaluate patient's response and provide education to patient for prescribed medication. RN will report any adverse and/or side effects to prescribing provider.  Therapeutic Interventions: 1 on 1 counseling sessions, Psychoeducation, Medication administration, Evaluate responses to treatment,  Monitor vital signs and CBGs as ordered, Perform/monitor CIWA, COWS, AIMS and Fall Risk screenings as ordered, Perform wound care treatments as ordered.  Evaluation of Outcomes: Not Met   LCSW Treatment Plan for Primary Diagnosis: Schizoaffective disorder, bipolar type ([place]) Long Term Goal(s): Safe transition to appropriate next level of care at discharge, Engage patient in therapeutic group addressing interpersonal concerns.  Short Term Goals: Engage patient in aftercare planning with referrals and resources, Increase social support, Increase emotional regulation and Increase skills for wellness and recovery  Therapeutic Interventions: Assess for all discharge needs, 1 to 1 time with Social worker, Explore available resources and support systems, Assess for adequacy in community support network, Educate family and significant other(s) on suicide prevention, Complete Psychosocial Assessment, Interpersonal group therapy.  Evaluation of Outcomes: Not Met   Progress in Treatment: Attending groups: Yes. Participating in groups: Yes. Taking medication as prescribed: Yes. Toleration medication: Yes. Family/Significant other contact made: No, will contact:  collateral w patient consent Patient understands diagnosis: No. and As evidenced by:  unclear whether patient understands impact of substance use and medication non adherence on wellness Discussing patient identified problems/goals with staff: Yes. Medical problems stabilized or resolved: Yes. Denies suicidal/homicidal ideation: No. and As evidenced by:  admitted after expressing SI, coping skills inadequate for environmental stressors Issues/concerns per patient self-inventory: No. Other: none  New problem(s) identified: Yes, Describe:  patient wants help in "staying out of the hospital", coping skills  New Short Term/Long Term Goal(s): meeting w [place] program re shelter options  Discharge Plan or Barriers: homeless, little support in  community  Reason for Continuation of Hospitalization: Depression Medication stabilization Suicidal ideation  Estimated Length of Stay: 3 - 5 days  Attendees: Patient: 03/03/2016 2:13 PM  Physician: Charlcie Cradle MD 03/03/2016 2:13 PM  Nursing: Jeryl Columbia RN 03/03/2016 2:13 PM  RN Care Manager: Addison Naegeli RN CM 03/03/2016 2:13 PM  Social Worker: Eusebio Me LCSW 03/03/2016 2:13 PM  Recreational Therapist: Corliss Skains LRT 03/03/2016 2:13 PM  Other:  03/03/2016 2:13 PM  Other:  03/03/2016 2:13 PM  Other: 03/03/2016 2:13 PM    Scribe for Treatment Team: Beverely Pace, LCSW 03/03/2016 2:13 PM

## 2016-03-03 NOTE — BHH Group Notes (Signed)
[facility] LCSW Aftercare Discharge Planning Group Note   03/03/2016 5:43 PM  Participation Quality:  minimal  Mood/Affect:  Depressed  Plan for Discharge/Comments:  Wants shelter or [facility]: bus  Supports: ACT TEam  Beverely Pace
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
## 2016-03-03 NOTE — Progress Notes (Signed)
Recreation Therapy Notes  Date: 03/03/16 Time: 1000 Location: 500 Hall Dayroom  Group Topic: Team Building  Goal Area(s) Addresses:  Patient will effectively work with peers in group.  Patient will verbalize benefit of team building. Patient will verbalize positive effect of healthy team building on post d/c goals.    Behavioral Response: Engaged  Intervention: Chairs, [employer]  Activity: Keep It Chartered certified accountant.  LRT arranged chairs in a circle.  Patients were to sit in the circle and toss the beach ball to each other as many times as possible without the ball coming to a stop.  The ball was allowed to bounce off of the ground but it could not roll to a stop.  It the ball came to a stop, the count would start over.  Education:Team Building, Discharge Planning  Education Outcome: Acknowledges understanding/In group clarification offered/Needs additional education.   Clinical Observations/Feedback: Pt was social, bright, engaged and encouraged her peers.  Pt stated she enjoyed the activity.    Victorino Sparrow, LRT/CTRS      Victorino Sparrow A 03/03/2016 12:34 PM
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
## 2016-03-04 LAB — GLUCOSE, CAPILLARY
GLUCOSE-CAPILLARY: 96 mg/dL (ref 65–99)
GLUCOSE-CAPILLARY: 81 mg/dL (ref 65–99)
Glucose-Capillary: 138 mg/dL — ABNORMAL HIGH (ref 65–99)
Glucose-Capillary: 128 mg/dL — ABNORMAL HIGH (ref 65–99)

## 2016-03-04 MED ORDER — TRAZODONE HCL 50 MG PO TABS
175.0000 mg | ORAL_TABLET | Freq: Every day | ORAL | Status: DC
  Administered 2016-03-04 – 2016-03-12 (×9): 175 mg via ORAL
  Filled 2016-03-04 (×9): qty 3.5
  Filled 2016-03-04: qty 25
  Filled 2016-03-04: qty 3.5

## 2016-03-04 MED ORDER — INSULIN GLARGINE 100 UNIT/ML ~~LOC~~ SOLN
24.0000 [IU] | Freq: Every day | SUBCUTANEOUS | Status: DC
  Administered 2016-03-04 – 2016-03-13 (×10): 24 [IU] via SUBCUTANEOUS
  Filled 2016-03-04 (×4): qty 0.24

## 2016-03-04 MED ORDER — PERPHENAZINE 4 MG PO TABS
4.0000 mg | ORAL_TABLET | ORAL | Status: DC
  Administered 2016-03-04 – 2016-03-13 (×18): 4 mg via ORAL
  Filled 2016-03-04: qty 1
  Filled 2016-03-04: qty 14
  Filled 2016-03-04 (×6): qty 1
  Filled 2016-03-04: qty 14
  Filled 2016-03-04 (×3): qty 1
  Filled 2016-03-04: qty 14
  Filled 2016-03-04 (×8): qty 1
  Filled 2016-03-04: qty 14
  Filled 2016-03-04 (×2): qty 1

## 2016-03-04 NOTE — Progress Notes (Addendum)
D: Edeline states she has had a good day. She has no issues or concerns. Denies HI/AVH at this time. Endorses passive SI. Contracts for safety.  A: Encouragement and support given. Q15 minute room checks for patient safety. Medications administered as prescribed.  R: Continue to monitor for patient safety and medication effectiveness.

## 2016-03-04 NOTE — [facility] Suicide Risk Assessment (Signed)
[place] INPATIENT:  Family/Significant Other Suicide Prevention Education  Suicide Prevention Education:  Patient Refusal for Family/Significant Other Suicide Prevention Education: The patient Maureen Swanson has refused to provide written consent for family/significant other to be provided Family/Significant Other Suicide Prevention Education during admission and/or prior to discharge.  Physician notified.  [name] 03/04/2016, 3:52 PM

## 2016-03-04 NOTE — Progress Notes (Signed)
[facility] MD Progress Note  03/04/2016 12:09 PM Maureen Swanson  MRN:  [id number] Subjective:  Patient reports " I am not so good."     Objective: Maureen Swanson  is a 50 y.o. AA female, who is divorced , has a hx of schizoaffective do as well as cocaine abuse, alcohol abuse , stimulant abuse , who presented [facility] ED with worsening depression and SI with plan to cut self. Maureen Swanson is well known to the [facility] unit .  Maureen Swanson today continues to have anxiety and depression, is paranoid and has sleep issues as well as command [place] telling her to hurt other people. Per staff - pt often seen as anxious , depressed and endorses withdrawal sx . Has been tolerating her medications well .Maureen Swanson continues to need encouragement and support.   Principal Problem: Schizoaffective disorder, bipolar type ([place]) Diagnosis:   Patient Active Problem List   Diagnosis Date Noted  . Hypomagnesemia [E83.42]   . Cannabis use disorder, moderate, dependence ([place]) [F12.20] 11/20/2015  . Homicidal ideation [R45.850]   . Tobacco use disorder [F17.200] 07/20/2015  . Diabetes mellitus ([place]) [E11.9] 07/20/2015  . Cocaine use disorder, moderate, dependence ([place]) [F14.20] 04/03/2015  . Schizoaffective disorder, bipolar type ([place]) [F25.0] 04/03/2015  . Alcohol use disorder, moderate, dependence ([place]) [F10.20] 04/03/2015  . Acute ischemic stroke ([employer]) [I63.9] 12/08/2014  . Left-sided weakness [M62.89] 12/08/2014  . 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 Knee pain [M25.569] 10/17/2010  . MAMMOGRAM, ABNORMAL, RIGHT [R92.8] 08/05/2010  . Dyslipidemia [E78.5] 06/21/2010  . AMENORRHEA, SECONDARY [N91.2] 10/29/2009  . 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 GERD [K21.9] 10/11/2007  . Essential hypertension [I10] 02/26/2007   Total Time spent with patient: 25 minutes  Past Psychiatric History: as per H&P.  Past Medical History:  Past Medical History:  Diagnosis Date  . Anxiety   . Arthritis    back and knees  . Asthma    daily and prn inhalers  . 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 Bipolar 1 disorder ([place])   . CHF (congestive heart failure) ([place])   . Depression   . Diabetes mellitus    diet-controlled  . Gastric ulcer   . GERD (gastroesophageal reflux disease)   . Gout   . Headache(784.0)    migraines  . High cholesterol   . Hypertension    under control, has been on med. x 12 yrs.  . Substance abuse    crack cocaine  . TMJ (temporomandibular joint disorder)     Past Surgical History:  Procedure Laterality Date  . BREAST LUMPECTOMY WITH NEEDLE LOCALIZATION  04/19/2012   Procedure: BREAST LUMPECTOMY WITH NEEDLE LOCALIZATION;  Surgeon: [name], MD;  Location: [facility];  Service: General;  Laterality: Right;  . KNEE ARTHROSCOPY W/ PARTIAL MEDIAL MENISCECTOMY  05/01/2010   right   Family History:  Family History  Problem Relation Age of Onset  . Diabetes Mother   . Breast cancer Mother   . Cancer Father   . Bipolar disorder Maternal Aunt   . Schizophrenia Maternal Grandfather   . Alcoholism Maternal Uncle    Family Psychiatric  History: See Above Social History: As per H&P. History  Alcohol Use  . 0.0 oz/week    Comment: reports she drinks 3 40 ounce beers daily     History  Drug Use  . Frequency: 5.0 times per week  . Types: Cocaine, Benzodiazepines    Comment: 02/28/16  Social History   Social History  . Marital status: Single    Spouse name: N/A  . Number of children: N/A  . Years of education: N/A   Social History Main Topics  . Smoking status: Current Every Day Smoker    Packs/day: 1.00    Years: 0.00    Types: Cigarettes  . Smokeless tobacco: Never Used  . Alcohol use 0.0 oz/week     Comment: reports she drinks 3 40 ounce beers daily  . Drug use:     Frequency: 5.0 times per week    Types: Cocaine, Benzodiazepines     Comment: 02/28/16  . Sexual activity: No   Other Topics Concern  . None   Social History  Narrative  . None   Additional Social History:    Pain Medications: See PTA Prescriptions: See PTA Over the Counter: See PTA History of alcohol / drug use?: Yes Longest period of sobriety (when/how long): 2 years Negative Consequences of Use: Financial, Legal Withdrawal Symptoms: Agitation Name of Substance 1: Cocaine 1 - Age of First Use: 22 1 - Amount (size/oz): $200 worth 1 - Frequency: Monthly/weekly 1 - Duration: Ongoing 1 - Last Use / Amount: 02/28/16                  Sleep: Poor   Appetite:  Fair  Current Medications: Current Facility-Administered Medications  Medication Dose Route Frequency Provider Last Rate Last Dose  . acetaminophen (TYLENOL) tablet 650 mg  650 mg Oral Q6H PRN Shuvon B Rankin, NP      . alum & mag hydroxide-simeth (MAALOX/MYLANTA) 200-200-20 MG/5ML suspension 30 mL  30 mL Oral Q4H PRN Shuvon B Rankin, NP   30 mL at 03/03/16 0922  . amLODipine (NORVASC) tablet 5 mg  5 mg Oral Daily Shuvon B Rankin, NP   5 mg at 03/04/16 0809  . [START ON 03/19/2016] ARIPiprazole ER SUSR 400 mg  400 mg Intramuscular Q28 days [name], MD      . atenolol (TENORMIN) tablet 100 mg  100 mg Oral Daily Shuvon B Rankin, NP   100 mg at 03/04/16 0810  . atorvastatin (LIPITOR) tablet 40 mg  40 mg Oral q1800 Shuvon B Rankin, NP   40 mg at 03/03/16 1735  . benztropine (COGENTIN) tablet 0.5 mg  0.5 mg Oral QHS Shuvon B Rankin, NP   0.5 mg at 03/03/16 2144  . chlordiazePOXIDE (LIBRIUM) capsule 25 mg  25 mg Oral Q6H PRN [name], MD   25 mg at 03/04/16 0853  . divalproex (DEPAKOTE) DR tablet 500 mg  500 mg Oral Q12H Shuvon B Rankin, NP   500 mg at 03/04/16 0810  . gabapentin (NEURONTIN) capsule 400 mg  400 mg Oral TID Shuvon B Rankin, NP   400 mg at 03/04/16 0809  . hydrOXYzine (ATARAX/VISTARIL) tablet 25 mg  25 mg Oral Q6H PRN Ashla Murph, MD      . insulin aspart (novoLOG) injection 0-15 Units  0-15 Units Subcutaneous TID WC [name], MD   3 Units at  03/03/16 1737  . insulin aspart (novoLOG) injection 0-5 Units  0-5 Units Subcutaneous QHS Kaidan Spengler, MD      . insulin glargine (LANTUS) injection 30 Units  30 Units Subcutaneous QHS Shuvon B Rankin, NP   30 Units at 03/03/16 2144  . linagliptin (TRADJENTA) tablet 5 mg  5 mg Oral Daily Shuvon B Rankin, NP   5 mg at 03/04/16 0809  . loperamide (IMODIUM) capsule 2-4  mg  2-4 mg Oral PRN [name], MD      . magnesium hydroxide (MILK OF MAGNESIA) suspension 30 mL  30 mL Oral Daily PRN Shuvon B Rankin, NP      . metFORMIN (GLUCOPHAGE) tablet 1,000 mg  1,000 mg Oral BID WC Shuvon B Rankin, NP   1,000 mg at 03/04/16 0810  . multivitamin with minerals tablet 1 tablet  1 tablet Oral Daily [name], MD   1 tablet at 03/04/16 0809  . OLANZapine (ZYPREXA) tablet 5 mg  5 mg Oral TID PRN [name], MD       Or  . OLANZapine (ZYPREXA) injection 5 mg  5 mg Intramuscular TID PRN [name], MD      . ondansetron (ZOFRAN-ODT) disintegrating tablet 4 mg  4 mg Oral Q6H PRN Pamelyn Bancroft, MD      . perphenazine (TRILAFON) tablet 2 mg  2 mg Oral BH-qamhs Tuvia Woodrick, MD   2 mg at 03/04/16 0809  . thiamine (VITAMIN B-1) tablet 100 mg  100 mg Oral Daily [name], MD   100 mg at 03/04/16 0809  . traZODone (DESYREL) tablet 150 mg  150 mg Oral QHS Lurena Nida, NP   150 mg at 03/03/16 2143    Lab Results:  Results for orders placed or performed during the hospital encounter of 03/02/16 (from the past 48 hour(s))  Glucose, capillary     Status: None   Collection Time: 03/03/16  5:48 AM  Result Value Ref Range   Glucose-Capillary 93 65 - 99 mg/dL  Glucose, capillary     Status: None   Collection Time: 03/03/16 11:49 AM  Result Value Ref Range   Glucose-Capillary 94 65 - 99 mg/dL  Glucose, capillary     Status: Abnormal   Collection Time: 03/03/16  5:25 PM  Result Value Ref Range   Glucose-Capillary 180 (H) 65 - 99 mg/dL  Glucose, capillary     Status: Abnormal   Collection  Time: 03/03/16  8:29 PM  Result Value Ref Range   Glucose-Capillary 101 (H) 65 - 99 mg/dL  Glucose, capillary     Status: None   Collection Time: 03/04/16  5:56 AM  Result Value Ref Range   Glucose-Capillary 96 65 - 99 mg/dL    Blood Alcohol level:  Lab Results  Component Value Date   ETH <5 02/29/2016   ETH 6 (H) 02/13/2016    Physical Findings: AIMS: Facial and Oral Movements Muscles of Facial Expression: None, normal Lips and Perioral Area: None, normal Jaw: None, normal Tongue: None, normal,Extremity Movements Upper (arms, wrists, hands, fingers): None, normal Lower (legs, knees, ankles, toes): None, normal, Trunk Movements Neck, shoulders, hips: None, normal, Overall Severity Severity of abnormal movements (highest score from questions above): None, normal Incapacitation due to abnormal movements: None, normal Patient's awareness of abnormal movements (rate only patient's report): No Awareness, Dental Status Current problems with teeth and/or dentures?: No Does patient usually wear dentures?: No  CIWA:    COWS:     Musculoskeletal: Strength & Muscle Tone: within normal limits Gait & Station: normal Patient leans: N/A  Psychiatric Specialty Exam: Review of Systems  Musculoskeletal: Positive for myalgias.  Psychiatric/Behavioral: Positive for depression, hallucinations, substance abuse and suicidal ideas. The patient is nervous/anxious and has insomnia.   All other systems reviewed and are negative.   Blood pressure 120/83, pulse 85, temperature 98.6 F (37 C), temperature source Oral, resp. rate 17, height 5\' 4"  (1.626 m), weight 96.6  kg (213 lb).Body mass index is 36.56 kg/m.  General Appearance: Casual  Eye Contact::  Fair  Speech:  Clear and Coherent  Volume:  Normal  Mood:  Anxious and Dysphoric   Affect:  Depressed  Thought Process:  Goal Directed and Descriptions of Associations: Circumstantial  Orientation:  Full (Time, Place, and Person)  Thought  Content:  Hallucinations: Auditory on and off - command asking her to hurt other people  Suicidal Thoughts:  Yes.  with intent/plan  Cut self  Homicidal Thoughts:  has AH asking her to hurt others  Memory:  Immediate;   Fair Recent;   Fair Remote;   Fair  Judgement:  Fair  Insight:  Shallow  Psychomotor Activity:  Restlessness  Concentration:  Fair  Recall:  [employer] of Knowledge:Fair  Language: Fair  Akathisia:  No  Handed:  Right  AIMS (if indicated):     Assets:  Communication Skills Desire for Improvement Resilience  ADL's:  Intact  Cognition: WNL  Sleep:  Number of Hours: 6.25     Treatment Plan Summary:[place] Jerilynn Mages Maureen Swanson is a  50 y.o.AA female , who has a hx of schizoaffective do , cocaine, alcohol, cannabis abuse, who voluntarily presents to [facility] s/p SI with plan to cut,  continues to be depressed, although progressing.  Will continue treatment.  Daily contact with patient to assess and evaluate symptoms and progress in treatment and Medication management  Will continue CIWA/Librium protocol for alcohol withdrawal sx. Will continue Abilify Maintena IM 400 mg - last dose on 02/20/16- repeat q28 days. Will continue Gabapentin 400 mg po tid for mood lability/anxiety/pain. Will increase Trazodone to 175  mg po qhs for sleep. Will increase Trilafon to 4 mg po bid for augmenting the effect of Abilify Maintena IM. Will continue Depakote DR 500 mg po bid for mood lability. Depakote level on 03/06/16. Restarted home medications as needed. Will continue to monitor vitals ,medication compliance and treatment side effects while patient is here.  Will monitor for medical issues as well as call consult as needed.  Reviewed labs cbc - hb - low , cmp- creatinine slightly elevated, TSH - wnl ( 08/10/15) , lipid panel - wnl ( 08/10/15) , hba1c-5.1 ( 11/20/15) ,uds - pos for BZD , cocaine , BAL - <5 , EKG for qtc monitoring-02/14/16- WNL .  CSW will continue working on disposition. Patient to be  referred to a substance abuse treatment program. Patient to participate in therapeutic milieu .  Tanae Petrosky, MD 03/04/2016, 12:09 PM

## 2016-03-04 NOTE — BHH Group Notes (Signed)
[place] LCSW Group Therapy  03/04/2016 3:56 PM   Type of Therapy:  Group Therapy  Participation Level:  Active  Participation Quality:  Attentive  Affect:  Appropriate  Cognitive:  Appropriate  Insight:  Improving  Engagement in Therapy:  Engaged  Modes of Intervention:  Clarification, Education, Exploration and Socialization  Summary of Progress/Problems: Today's group focused on relapse prevention.  We defined the term, and then brainstormed on ways to prevent relapse.  Stayed the whole time, engaged throughout.  "My relapse was going back to a man.  I need to stay away from men and just focus on me."  Admitted she has no place to go but homeless shelter.  [name] 03/04/2016 , 3:56 PM

## 2016-03-04 NOTE — Progress Notes (Signed)
D: Maureen Swanson's mood has been stable today. She has been pleasant. Still endorsing passive SI. Contracts for safety.  Denies HI/AVH at this time. Contracts for safety.  A: Encouragement and support given. Q15 minute room checks for patient safety. Medications administered as prescribed.  R: Continue to monitor for patient safety and medication effectiveness.

## 2016-03-04 NOTE — Progress Notes (Signed)
Patient endorse SI with no plan and AVH.   Patient was able to talk to writer and expressed that she felt a great deal of anxiety Patient was able to take anxiety medications and stated that she would be able to come to the staff if she felt like she was going to self harm.    Assess patient for safety, offer medications as prescribed, engage patient in 1:1 staff talks.   Patient was able to contract for safety, continue to monitor as prescribed.

## 2016-03-04 NOTE — Progress Notes (Signed)
Recreation Therapy Notes  Date: 03/04/16 Time: 1000 Location: 500 Hall Dayroom  Group Topic: Self-Esteem  Goal Area(s) Addresses:  Patient will identify positive ways to increase self-esteem. Patient will verbalize benefit of increased self-esteem.  Behavioral Response: Engaged  Intervention: Social worker, Architect paper  Activity: Personalized License Plate.  LRT gave each person a piece of construction paper.  Patients were to use colored pencils to draw their license plate and highlight the things that are important to them and what makes them unique.  Education:  Self-Esteem, Dentist.   Education Outcome: Acknowledges education/In group clarification offered/Needs additional education  Clinical Observations/Feedback: Pt highlighted her kids initials as being important to her.  Pt stated emphasizing these things helps her to "think about them and be happy".   Victorino Sparrow, LRT/CTRS       Victorino Sparrow A 03/04/2016 11:31 AM

## 2016-03-05 LAB — GLUCOSE, CAPILLARY
GLUCOSE-CAPILLARY: 108 mg/dL — AB (ref 65–99)
GLUCOSE-CAPILLARY: 113 mg/dL — AB (ref 65–99)
GLUCOSE-CAPILLARY: 135 mg/dL — AB (ref 65–99)
Glucose-Capillary: 110 mg/dL — ABNORMAL HIGH (ref 65–99)

## 2016-03-05 NOTE — BHH Group Notes (Signed)
[place] LCSW Group Therapy  03/05/2016 2:28 PM   Type of Therapy:  Group Therapy   Participation Level:  Engaged  Participation Quality:  Attentive  Affect:  Appropriate   Cognitive:  Alert   Insight:  Engaged  Engagement in Therapy:  Improving   Modes of Intervention:  Education, Exploration, Socialization   Summary of Progress/Problems: [place] was engaged throughout, stayed entire time.   [name] from the [facility] was here to tell his story of recovery, inform patients about [facility]HA and play his guitar.   [name] 03/05/2016 2:28 PM

## 2016-03-05 NOTE — Progress Notes (Signed)
Nursing Note 03/05/2016 [id number]  Data Reports sleeping fair without PRN sleep med.  Rates depression 8/10, hopelessness 9/10, and anxiety 9/10. Affect flat mood "really depressed."  Denied HI.  Reports passive SI and self harm thoughts, states she was considering breaking a spoon and cutting herself yesterday, but states she has no intent to do so today.  Agrees to come to staff before acting on any harmful thoughts.  Reports intermittent auditory hallucinations.  C/O chronic/intermittent neuropathic foot pain. Attending groups, went outside for fresh air.  This afternoon patient reported feeling urge to break spoon and cut self.   Action Spoke with patient during time when she had urge to cut self.  Allowed her to express her feelings.  Given PRN Zyprexa for agitation, given word searches and coloring pages.  Patient stated before acting on thoughts she could come to staff and talk, color, and do word searches to keep herself busy.  Spoke with patient 1:1, nurse offered support to patient throughout shift.  Encouraged patient to come to staff if she was having SI or self harm thoughts that she was planning to act on.  PRN's given for anxiety/pain.  Continues to be monitored on 15 minute checks for safety.  Response PRN's minimally effective for anxiety/pain.  PRN for agitation effective, patient reported decrease in self-harm thoughts.  Remains safe on unit.

## 2016-03-05 NOTE — Progress Notes (Signed)
Adult Psychoeducational Group Note  Date:  03/05/2016 Time:  9:15 PM  Group Topic/Focus:  Wrap-Up Group:   The focus of this group is to help patients review their daily goal of treatment and discuss progress on daily workbooks.   Participation Level:  Minimal  Participation Quality:  Appropriate  Affect:  Appropriate  Cognitive:  Oriented  Insight: Limited  Engagement in Group:  Engaged  Modes of Intervention:  Socialization and Support  Additional Comments:  Patient attended and participated in group tonight. She report that today was a 8. She went to group, stayed awake and went for meals. She did have an episode today which was quickly cleared up. She colored today and spoke with her doctor.  Salley Scarlet [facility] 03/05/2016, 9:15 PM

## 2016-03-05 NOTE — Progress Notes (Signed)
Recreation Therapy Notes  Date: 03/05/16 Time: 1000 Location: 500 Hall Dayroom  Group Topic: Wellness  Goal Area(s) Addresses:  Patient will define components of whole wellness. Patient will verbalize benefit of whole wellness.  Behavioral Response: Engaged  Intervention:  Dry erase marker, dry erase board, eraser   Activity: Wellness Pictionary.  LRT had a can with various activities that could be used to enhance mental, physical, emotional and spiritual health.  Patients were to pull a strip of paper from the can with one of the activities on it.  The patient was to draw whatever was on the paper onto the board.  The remaining patients would then try to guess what the patient is drawing.  Education: Wellness, Dentist.   Education Outcome: Acknowledges education/In group clarification offered/Needs additional education.   Clinical Observations/Feedback: Pt explained that one aspect of wellness is "how you feel".  Pt was active, social and engaged during group.  Pt participated fully and seemed up beat in group.   Victorino Sparrow, LRT/CTRS     Victorino Sparrow A 03/05/2016 11:31 AM

## 2016-03-05 NOTE — Progress Notes (Signed)
[facility] MD Progress Note  03/05/2016 11:00 AM Maureen Swanson  MRN:  [id number] Subjective:  Patient reports " I am not feeling so good."      Objective: Maureen Swanson  is a 50 y.o. AA female, who is divorced , has a hx of schizoaffective do as well as cocaine abuse, alcohol abuse , stimulant abuse , who presented [facility] ED with worsening depression and SI with plan to cut self. Maureen Swanson is well known to the [facility] unit .  Maureen Swanson today continues to have SI - reports some thoughts earlier about using a plastic spoon to cut self - however she was able to ask for help. She continues to contract for safety on the unit . Pt continues to be anxious and depressed on and off. She also has on and off command AH asking her to kill self or others , although improving. Per staff - pt often seen as anxious , depressed and endorses withdrawal sx . Has been tolerating her medications well .Maureen Swanson continues to need encouragement and support.   Principal Problem: Schizoaffective disorder, bipolar type ([place]) Diagnosis:   Patient Active Problem List   Diagnosis Date Noted  . Hypomagnesemia [E83.42]   . Cannabis use disorder, moderate, dependence ([place]) [F12.20] 11/20/2015  . Homicidal ideation [R45.850]   . Tobacco use disorder [F17.200] 07/20/2015  . Diabetes mellitus ([place]) [E11.9] 07/20/2015  . Cocaine use disorder, moderate, dependence ([place]) [F14.20] 04/03/2015  . Schizoaffective disorder, bipolar type ([place]) [F25.0] 04/03/2015  . Alcohol use disorder, moderate, dependence ([place]) [F10.20] 04/03/2015  . Acute ischemic stroke ([place]) [I63.9] 12/08/2014  . Left-sided weakness [M62.89] 12/08/2014  . 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 Knee pain [M25.569] 10/17/2010  . MAMMOGRAM, ABNORMAL, RIGHT [R92.8] 08/05/2010  . Dyslipidemia [E78.5] 06/21/2010  . AMENORRHEA, SECONDARY [N91.2] 10/29/2009  . 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 GERD [K21.9] 10/11/2007  . Essential hypertension [I10] 02/26/2007    Total Time spent with patient: 25 minutes  Past Psychiatric History: as per H&P.  Past Medical History:  Past Medical History:  Diagnosis Date  . Anxiety   . Arthritis    back and knees  . Asthma    daily and prn inhalers  . 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 Bipolar 1 disorder ([place])   . CHF (congestive heart failure) ([place])   . Depression   . Diabetes mellitus    diet-controlled  . Gastric ulcer   . GERD (gastroesophageal reflux disease)   . Gout   . Headache(784.0)    migraines  . High cholesterol   . Hypertension    under control, has been on med. x 12 yrs.  . Substance abuse    crack cocaine  . TMJ (temporomandibular joint disorder)     Past Surgical History:  Procedure Laterality Date  . BREAST LUMPECTOMY WITH NEEDLE LOCALIZATION  04/19/2012   Procedure: BREAST LUMPECTOMY WITH NEEDLE LOCALIZATION;  Surgeon: [name], MD;  Location: [facility];  Service: General;  Laterality: Right;  . KNEE ARTHROSCOPY W/ PARTIAL MEDIAL MENISCECTOMY  05/01/2010   right   Family History:  Family History  Problem Relation Age of Onset  . Diabetes Mother   . Breast cancer Mother   . Cancer Father   . Bipolar disorder Maternal Aunt   . Schizophrenia Maternal Grandfather   . Alcoholism Maternal Uncle    Family Psychiatric  History: See Above Social History: As per H&P. History  Alcohol Use  . 0.0 oz/week    Comment: reports she drinks 3 40 ounce beers daily     History  Drug Use  . Frequency: 5.0 times per week  . Types: Cocaine, Benzodiazepines    Comment: 02/28/16    Social History   Social History  . Marital status: Single    Spouse name: N/A  . Number of children: N/A  . Years of education: N/A   Social History Main Topics  . Smoking status: Current Every Day Smoker    Packs/day: 1.00    Years: 0.00    Types: Cigarettes  . Smokeless tobacco: Never Used  . Alcohol use 0.0 oz/week     Comment: reports she drinks  3 40 ounce beers daily  . Drug use:     Frequency: 5.0 times per week    Types: Cocaine, Benzodiazepines     Comment: 02/28/16  . Sexual activity: No   Other Topics Concern  . None   Social History Narrative  . None   Additional Social History:    Pain Medications: See PTA Prescriptions: See PTA Over the Counter: See PTA History of alcohol / drug use?: Yes Longest period of sobriety (when/how long): 2 years Negative Consequences of Use: Financial, Legal Withdrawal Symptoms: Agitation Name of Substance 1: Cocaine 1 - Age of First Use: 22 1 - Amount (size/oz): $200 worth 1 - Frequency: Monthly/weekly 1 - Duration: Ongoing 1 - Last Use / Amount: 02/28/16                  Sleep: Fair   Appetite:  Fair  Current Medications: Current Facility-Administered Medications  Medication Dose Route Frequency Provider Last Rate Last Dose  . acetaminophen (TYLENOL) tablet 650 mg  650 mg Oral Q6H PRN Shuvon B Rankin, NP   650 mg at 03/05/16 0843  . alum & mag hydroxide-simeth (MAALOX/MYLANTA) 200-200-20 MG/5ML suspension 30 mL  30 mL Oral Q4H PRN Shuvon B Rankin, NP   30 mL at 03/03/16 0922  . amLODipine (NORVASC) tablet 5 mg  5 mg Oral Daily Shuvon B Rankin, NP   5 mg at 03/05/16 0840  . [START ON 03/19/2016] ARIPiprazole ER SUSR 400 mg  400 mg Intramuscular Q28 days [name], MD      . atenolol (TENORMIN) tablet 100 mg  100 mg Oral Daily Shuvon B Rankin, NP   100 mg at 03/05/16 [id number]  . atorvastatin (LIPITOR) tablet 40 mg  40 mg Oral q1800 Shuvon B Rankin, NP   40 mg at 03/04/16 1727  . benztropine (COGENTIN) tablet 0.5 mg  0.5 mg Oral QHS Shuvon B Rankin, NP   0.5 mg at 03/04/16 2157  . chlordiazePOXIDE (LIBRIUM) capsule 25 mg  25 mg Oral Q6H PRN [name], MD   25 mg at 03/04/16 0853  . divalproex (DEPAKOTE) DR tablet 500 mg  500 mg Oral Q12H Shuvon B Rankin, NP   500 mg at 03/05/16 [id number]  . gabapentin (NEURONTIN) capsule 400 mg  400 mg Oral TID Shuvon B Rankin, NP   400  mg at 03/05/16 [id number]  . hydrOXYzine (ATARAX/VISTARIL) tablet 25 mg  25 mg Oral Q6H PRN [name], MD   25 mg at 03/05/16 0842  . insulin aspart (novoLOG) injection 0-15 Units  0-15 Units Subcutaneous TID WC [name], MD   2 Units at 03/04/16 1731  . insulin aspart (novoLOG) injection 0-5 Units  0-5 Units Subcutaneous QHS [name], MD      .  insulin glargine (LANTUS) injection 24 Units  24 Units Subcutaneous QHS [name], PA-C   24 Units at 03/04/16 2200  . linagliptin (TRADJENTA) tablet 5 mg  5 mg Oral Daily Shuvon B Rankin, NP   5 mg at 03/05/16 [id number]  . loperamide (IMODIUM) capsule 2-4 mg  2-4 mg Oral PRN [name], MD      . magnesium hydroxide (MILK OF MAGNESIA) suspension 30 mL  30 mL Oral Daily PRN Shuvon B Rankin, NP      . metFORMIN (GLUCOPHAGE) tablet 1,000 mg  1,000 mg Oral BID WC Shuvon B Rankin, NP   1,000 mg at 03/05/16 [id number]  . multivitamin with minerals tablet 1 tablet  1 tablet Oral Daily [name], MD   1 tablet at 03/05/16 0838  . OLANZapine (ZYPREXA) tablet 5 mg  5 mg Oral TID PRN [name], MD   5 mg at 03/04/16 1332   Or  . OLANZapine (ZYPREXA) injection 5 mg  5 mg Intramuscular TID PRN [name], MD      . ondansetron (ZOFRAN-ODT) disintegrating tablet 4 mg  4 mg Oral Q6H PRN Cabe Lashley, MD      . perphenazine (TRILAFON) tablet 4 mg  4 mg Oral BH-qamhs [name], MD   4 mg at 03/05/16 [id number]  . thiamine (VITAMIN B-1) tablet 100 mg  100 mg Oral Daily [name], MD   100 mg at 03/05/16 [id number]  . traZODone (DESYREL) tablet 175 mg  175 mg Oral QHS [name], MD   175 mg at 03/04/16 2156    Lab Results:  Results for orders placed or performed during the hospital encounter of 03/02/16 (from the past 48 hour(s))  Glucose, capillary     Status: None   Collection Time: 03/03/16 11:49 AM  Result Value Ref Range   Glucose-Capillary 94 65 - 99 mg/dL  Glucose, capillary     Status: Abnormal   Collection Time: 03/03/16  5:25 PM   Result Value Ref Range   Glucose-Capillary 180 (H) 65 - 99 mg/dL  Glucose, capillary     Status: Abnormal   Collection Time: 03/03/16  8:29 PM  Result Value Ref Range   Glucose-Capillary 101 (H) 65 - 99 mg/dL  Glucose, capillary     Status: None   Collection Time: 03/04/16  5:56 AM  Result Value Ref Range   Glucose-Capillary 96 65 - 99 mg/dL  Glucose, capillary     Status: Abnormal   Collection Time: 03/04/16 11:59 AM  Result Value Ref Range   Glucose-Capillary 138 (H) 65 - 99 mg/dL  Glucose, capillary     Status: Abnormal   Collection Time: 03/04/16  5:21 PM  Result Value Ref Range   Glucose-Capillary 128 (H) 65 - 99 mg/dL  Glucose, capillary     Status: None   Collection Time: 03/04/16  8:38 PM  Result Value Ref Range   Glucose-Capillary 81 65 - 99 mg/dL   Comment 1 Notify RN   Glucose, capillary     Status: Abnormal   Collection Time: 03/05/16  6:22 AM  Result Value Ref Range   Glucose-Capillary 108 (H) 65 - 99 mg/dL   Comment 1 Notify RN     Blood Alcohol level:  Lab Results  Component Value Date   ETH <5 02/29/2016   ETH 6 (H) 02/13/2016    Physical Findings: AIMS: Facial and Oral Movements Muscles of Facial Expression: None, normal Lips and Perioral Area: None, normal Jaw: None, normal Tongue:  None, normal,Extremity Movements Upper (arms, wrists, hands, fingers): None, normal Lower (legs, knees, ankles, toes): None, normal, Trunk Movements Neck, shoulders, hips: None, normal, Overall Severity Severity of abnormal movements (highest score from questions above): None, normal Incapacitation due to abnormal movements: None, normal Patient's awareness of abnormal movements (rate only patient's report): No Awareness, Dental Status Current problems with teeth and/or dentures?: No Does patient usually wear dentures?: No  CIWA:  CIWA-Ar Total: 5 COWS:     Musculoskeletal: Strength & Muscle Tone: within normal limits Gait & Station: normal Patient leans:  N/A  Psychiatric Specialty Exam: Review of Systems  Musculoskeletal: Positive for myalgias.  Psychiatric/Behavioral: Positive for depression, hallucinations, substance abuse and suicidal ideas. The patient is nervous/anxious.   All other systems reviewed and are negative.   Blood pressure (!) 133/93, pulse 82, temperature 98.9 F (37.2 C), temperature source Oral, resp. rate 16, height 5\' 4"  (1.626 m), weight 96.6 kg (213 lb).Body mass index is 36.56 kg/m.  General Appearance: Casual  Eye Contact::  Fair  Speech:  Clear and Coherent  Volume:  Normal  Mood:  Anxious, Depressed and Dysphoric   Affect:  Depressed  Thought Process:  Goal Directed and Descriptions of Associations: Circumstantial  Orientation:  Full (Time, Place, and Person)  Thought Content:  Hallucinations: Auditory on and off - command asking her to hurt other people  Suicidal Thoughts:  Yes.  with intent/plan  Cut self, but contracts for safety on the unit  Homicidal Thoughts:  has AH asking her to hurt others and self - on and off  Memory:  Immediate;   Fair Recent;   Fair Remote;   Fair  Judgement:  Fair  Insight:  Shallow  Psychomotor Activity:  Restlessness  Concentration:  Fair  Recall:  [employer] of Knowledge:Fair  Language: Fair  Akathisia:  No  Handed:  Right  AIMS (if indicated):     Assets:  Communication Skills Desire for Improvement Resilience  ADL's:  Intact  Cognition: WNL  Sleep:  Number of Hours: 6.75     Treatment Plan Summary:Maureen Swanson is a  50 y.o.AA female , who has a hx of schizoaffective do , cocaine, alcohol, cannabis abuse, who voluntarily presents to [facility] s/p SI with plan to cut,  continues to be depressed, and has continued SI with plan on and off , although progressing.  Will continue treatment.  Daily contact with patient to assess and evaluate symptoms and progress in treatment and Medication management  Will continue CIWA/Librium protocol for alcohol withdrawal sx. Will  continue Abilify Maintena IM 400 mg - last dose on 02/20/16- repeat q28 days. Will continue Gabapentin 400 mg po tid for mood lability/anxiety/pain. Will continue Trazodone 175  mg po qhs for sleep. Increased Trilafon to 4 mg po bid for augmenting the effect of Abilify Maintena IM. Will continue Depakote DR 500 mg po bid for mood lability. Depakote level on 03/06/16. Restarted home medications as needed. Will continue to monitor vitals ,medication compliance and treatment side effects while patient is here.  Will monitor for medical issues as well as call consult as needed.  Reviewed labs cbc - hb - low , cmp- creatinine slightly elevated, TSH - wnl ( 08/10/15) , lipid panel - wnl ( 08/10/15) , hba1c-5.1 ( 11/20/15) ,uds - pos for BZD , cocaine , BAL - <5 , EKG for qtc monitoring-02/14/16- WNL .  CSW will continue working on disposition. Patient to be referred to a substance abuse treatment program. Patient  to participate in therapeutic milieu .  Reve Crocket, MD 03/05/2016, 11:00 AM

## 2016-03-06 LAB — GLUCOSE, CAPILLARY
GLUCOSE-CAPILLARY: 149 mg/dL — AB (ref 65–99)
Glucose-Capillary: 102 mg/dL — ABNORMAL HIGH (ref 65–99)
Glucose-Capillary: 99 mg/dL (ref 65–99)
Glucose-Capillary: 155 mg/dL — ABNORMAL HIGH (ref 65–99)

## 2016-03-06 NOTE — Progress Notes (Signed)
DAR NOTE: Patient presents with anxious affect and depressed mood. Pt spent most of her day in the day area watching TV. Denies pain, auditory and visual hallucinations.  Rates depression at 8, hopelessness at 9, and anxiety at 9.  Maintained on routine safety checks.  Medications given as prescribed.  Support and encouragement offered as needed.  Attended group and participated.  States goal for today is " To stop thinking and trying to commit suicide with a spoon." Patient observed socializing with peers in the dayroom.  Offered no complaint.

## 2016-03-06 NOTE — Progress Notes (Signed)
Recreation Therapy Notes  Date: 03/06/16 Time: 1000 Location: 500 Hall Dayroom  Group Topic: Coping Skills  Goal Area(s) Addresses:  Patients will be able to identify positive coping skills. Patients will be able to identify benefits of coping skills post d/c.  Behavioral Response: Engaged  Intervention: Worksheets, Corporate investment banker, glue, Architect paper, magazines  Activity: Museum/gallery conservator.  LRT went over the concept of coping skills and why they are important.  Patients were to use the magazines to cut out pictures of the coping skills they use or would like to learn.  Patients were then to glue the pictures onto the worksheet.  The worksheet is broken down into 5 components were coping skills can be used.  Those components are diversions, social, cognitive, tension releasers and physical.    Education: Coping Skills, Discharge Planning.   Education Outcome: Acknowledges understanding/In group clarification offered/Needs additional education.   Clinical Observations/Feedback: Pt identified some of her coping skills as batman car or toys as a distraction, going to the fair or playground as social, using the computer or cell phone as cognitive, and the PS2 and ice cream as a tension releaser.  Pt stated if she uses her coping skills once discharged it will "keep me busy and out of trouble".   Victorino Sparrow, LRT/CTRS      Victorino Sparrow A 03/06/2016 11:41 AM

## 2016-03-06 NOTE — Plan of Care (Signed)
Problem: Safety: Goal: Periods of time without injury will increase Outcome: Progressing Pt. denies SI/HI at this time, remains a low fall risk, Q 15 checks in place.

## 2016-03-06 NOTE — Progress Notes (Signed)
D: Pt. is up and visible in the milieu, watching TV. Pt. denies HI and AVH at this time. Pt. states current passive SI but verbally contracts for safety. Rates pain as 0/10. Pt. presents with a depressed and sad affect. Pt. responds in minimal phrases with Probation officer and is soft spoken.   A: Encouragement and support given. Meds. ordered and given. CBG @ bedtime was 110. Snacks requested and accepted.   R: Safety maintained with Q 15 checks. Continues to follow treatment plan and will monitor closely.

## 2016-03-06 NOTE — Progress Notes (Signed)
D. Pt presents with a flat affect and depressed behavior. Patient in bed upon initial contact, but later got out of bed for snack and evening meds.then went back to bed Pt currently denies SI/HI and AVH and agrees to contact staff before acting on any harmful thoughts.  A. Labs and vitals monitored. Pt compliant with medications. Pt supported emotionally and encouraged to express concerns and ask questions.   R. Pt remains safe with 15 minute checks. Will continue POC.

## 2016-03-06 NOTE — [facility] Group Notes (Signed)
[place] Group Notes:  (Counselor/Nursing/MHT/Case Management/Adjunct)  03/06/2016 1:15PM  Type of Therapy:  Group Therapy  Participation Level:  Active  Participation Quality:  Appropriate  Affect:  Flat  Cognitive:  Oriented  Insight:  Improving  Engagement in Group:  Limited  Engagement in Therapy:  Limited  Modes of Intervention:  Discussion, Exploration and Socialization  Summary of Progress/Problems: The topic for group was balance in life.  Pt participated in the discussion about when their life was in balance and out of balance and how this feels.  Pt discussed ways to get back in balance and short term goals they can work on to get where they want to be. Stayed the entire time, minimal engagement.  Stated her day started out bad, and it has not changed since then.  Although it does not happen often, she states she is unable to turn it around when this happens.  In the past, she was able to listen to music, and that would help.  But now, the voices are so bad that music does not help.  "But Dr Shea Evans is increasing my meds today, so that should help."   [name] 03/06/2016 2:48 PM

## 2016-03-06 NOTE — Progress Notes (Signed)
[facility] MD Progress Note  03/06/2016 12:54 PM ANIE PLA  MRN:  [id number] Subjective:  Patient reports " I am not feeling so good. I feel so depressed. I do not want to take any of my medications."       Objective: JAYLEI ENSZ  is a 50 y.o. AA female, who is divorced , has a hx of schizoaffective do as well as cocaine abuse, alcohol abuse , stimulant abuse , who presented [facility] ED with worsening depression and SI with plan to cut self. Daysie is well known to the [facility] unit .  Bonni today continues to have SI -reported thoughts to use a sharp object like a plastic spoon earlier today. Pt however contracts for safety on the unit. Pt per RN also had refused AM medications - reported the same to writer - pt was educated about the need for medications.  Pt continues to be anxious and depressed , states it is worse today. She also has on and off command AH asking her to kill self or others , although improving. Jennia continues to need encouragement and support.   Principal Problem: Schizoaffective disorder, bipolar type ([place]) Diagnosis:   Patient Active Problem List   Diagnosis Date Noted  . Hypomagnesemia [E83.42]   . Cannabis use disorder, moderate, dependence ([place]) [F12.20] 11/20/2015  . Homicidal ideation [R45.850]   . Tobacco use disorder [F17.200] 07/20/2015  . Diabetes mellitus ([place]) [E11.9] 07/20/2015  . Cocaine use disorder, moderate, dependence ([place]) [F14.20] 04/03/2015  . Schizoaffective disorder, bipolar type ([place]) [F25.0] 04/03/2015  . Alcohol use disorder, moderate, dependence ([place]) [F10.20] 04/03/2015  . Acute ischemic stroke ([place]) [I63.9] 12/08/2014  . Left-sided weakness [M62.89] 12/08/2014  . 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 Knee pain [M25.569] 10/17/2010  . MAMMOGRAM, ABNORMAL, RIGHT [R92.8] 08/05/2010  . Dyslipidemia [E78.5] 06/21/2010  . AMENORRHEA, SECONDARY [N91.2] 10/29/2009  . 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 GERD [K21.9] 10/11/2007   . Essential hypertension [I10] 02/26/2007   Total Time spent with patient: 25 minutes  Past Psychiatric History: as per H&P.  Past Medical History:  Past Medical History:  Diagnosis Date  . Anxiety   . Arthritis    back and knees  . Asthma    daily and prn inhalers  . 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 Bipolar 1 disorder ([place])   . CHF (congestive heart failure) ([place])   . Depression   . Diabetes mellitus    diet-controlled  . Gastric ulcer   . GERD (gastroesophageal reflux disease)   . Gout   . Headache(784.0)    migraines  . High cholesterol   . Hypertension    under control, has been on med. x 12 yrs.  . Substance abuse    crack cocaine  . TMJ (temporomandibular joint disorder)     Past Surgical History:  Procedure Laterality Date  . BREAST LUMPECTOMY WITH NEEDLE LOCALIZATION  04/19/2012   Procedure: BREAST LUMPECTOMY WITH NEEDLE LOCALIZATION;  Surgeon: [name], MD;  Location: [place];  Service: General;  Laterality: Right;  . KNEE ARTHROSCOPY W/ PARTIAL MEDIAL MENISCECTOMY  05/01/2010   right   Family History:  Family History  Problem Relation Age of Onset  . Diabetes Mother   . Breast cancer Mother   . Cancer Father   . Bipolar disorder Maternal Aunt   . Schizophrenia Maternal Grandfather   . Alcoholism Maternal Uncle    Family Psychiatric  History: See Above Social History: As per H&P. History  Alcohol Use  . 0.0 oz/week    Comment: reports she drinks 3 40 ounce beers daily     History  Drug Use  . Frequency: 5.0 times per week  . Types: Cocaine, Benzodiazepines    Comment: 02/28/16    Social History   Social History  . Marital status: Single    Spouse name: N/A  . Number of children: N/A  . Years of education: N/A   Social History Main Topics  . Smoking status: Current Every Day Smoker    Packs/day: 1.00    Years: 0.00    Types: Cigarettes  . Smokeless tobacco: Never Used  . Alcohol use  0.0 oz/week     Comment: reports she drinks 3 40 ounce beers daily  . Drug use:     Frequency: 5.0 times per week    Types: Cocaine, Benzodiazepines     Comment: 02/28/16  . Sexual activity: No   Other Topics Concern  . None   Social History Narrative  . None   Additional Social History:    Pain Medications: See PTA Prescriptions: See PTA Over the Counter: See PTA History of alcohol / drug use?: Yes Longest period of sobriety (when/how long): 2 years Negative Consequences of Use: Financial, Legal Withdrawal Symptoms: Agitation Name of Substance 1: Cocaine 1 - Age of First Use: 22 1 - Amount (size/oz): $200 worth 1 - Frequency: Monthly/weekly 1 - Duration: Ongoing 1 - Last Use / Amount: 02/28/16                  Sleep: Fair   Appetite:  Fair  Current Medications: Current Facility-Administered Medications  Medication Dose Route Frequency Provider Last Rate Last Dose  . acetaminophen (TYLENOL) tablet 650 mg  650 mg Oral Q6H PRN Shuvon B Rankin, NP   650 mg at 03/05/16 0843  . alum & mag hydroxide-simeth (MAALOX/MYLANTA) 200-200-20 MG/5ML suspension 30 mL  30 mL Oral Q4H PRN Shuvon B Rankin, NP   30 mL at 03/03/16 0922  . amLODipine (NORVASC) tablet 5 mg  5 mg Oral Daily Shuvon B Rankin, NP   5 mg at 03/06/16 0955  . [START ON 03/19/2016] ARIPiprazole ER SUSR 400 mg  400 mg Intramuscular Q28 days [name], MD      . atenolol (TENORMIN) tablet 100 mg  100 mg Oral Daily Shuvon B Rankin, NP   100 mg at 03/06/16 0953  . atorvastatin (LIPITOR) tablet 40 mg  40 mg Oral q1800 Shuvon B Rankin, NP   40 mg at 03/05/16 1716  . benztropine (COGENTIN) tablet 0.5 mg  0.5 mg Oral QHS Shuvon B Rankin, NP   0.5 mg at 03/05/16 2111  . divalproex (DEPAKOTE) DR tablet 500 mg  500 mg Oral Q12H Shuvon B Rankin, NP   500 mg at 03/06/16 0954  . gabapentin (NEURONTIN) capsule 400 mg  400 mg Oral TID Shuvon B Rankin, NP   400 mg at 03/06/16 1152  . hydrOXYzine (ATARAX/VISTARIL) tablet 25  mg  25 mg Oral Q6H PRN [name], MD   25 mg at 03/05/16 0842  . insulin aspart (novoLOG) injection 0-15 Units  0-15 Units Subcutaneous TID WC [name], MD   2 Units at 03/05/16 1718  . insulin aspart (novoLOG) injection 0-5 Units  0-5 Units Subcutaneous QHS Sarrah Fiorenza, MD      . insulin glargine (LANTUS) injection 24 Units  24 Units Subcutaneous QHS Frederico Hamman  E Simon, PA-C   24 Units at 03/05/16 2111  . linagliptin (TRADJENTA) tablet 5 mg  5 mg Oral Daily Shuvon B Rankin, NP   5 mg at 03/06/16 0956  . magnesium hydroxide (MILK OF MAGNESIA) suspension 30 mL  30 mL Oral Daily PRN Shuvon B Rankin, NP      . metFORMIN (GLUCOPHAGE) tablet 1,000 mg  1,000 mg Oral BID WC Shuvon B Rankin, NP   1,000 mg at 03/06/16 0955  . multivitamin with minerals tablet 1 tablet  1 tablet Oral Daily [name], MD   1 tablet at 03/06/16 0955  . OLANZapine (ZYPREXA) tablet 5 mg  5 mg Oral TID PRN [name], MD   5 mg at 03/05/16 1535   Or  . OLANZapine (ZYPREXA) injection 5 mg  5 mg Intramuscular TID PRN [name], MD      . perphenazine (TRILAFON) tablet 4 mg  4 mg Oral BH-qamhs Haleema Vanderheyden, MD   4 mg at 03/06/16 0953  . thiamine (VITAMIN B-1) tablet 100 mg  100 mg Oral Daily [name], MD   100 mg at 03/06/16 0955  . traZODone (DESYREL) tablet 175 mg  175 mg Oral QHS [name], MD   175 mg at 03/05/16 2111    Lab Results:  Results for orders placed or performed during the hospital encounter of 03/02/16 (from the past 48 hour(s))  Glucose, capillary     Status: Abnormal   Collection Time: 03/04/16  5:21 PM  Result Value Ref Range   Glucose-Capillary 128 (H) 65 - 99 mg/dL  Glucose, capillary     Status: None   Collection Time: 03/04/16  8:38 PM  Result Value Ref Range   Glucose-Capillary 81 65 - 99 mg/dL   Comment 1 Notify RN   Glucose, capillary     Status: Abnormal   Collection Time: 03/05/16  6:22 AM  Result Value Ref Range   Glucose-Capillary 108 (H) 65 - 99 mg/dL    Comment 1 Notify RN   Glucose, capillary     Status: Abnormal   Collection Time: 03/05/16 12:09 PM  Result Value Ref Range   Glucose-Capillary 113 (H) 65 - 99 mg/dL  Glucose, capillary     Status: Abnormal   Collection Time: 03/05/16  4:58 PM  Result Value Ref Range   Glucose-Capillary 135 (H) 65 - 99 mg/dL  Glucose, capillary     Status: Abnormal   Collection Time: 03/05/16  8:42 PM  Result Value Ref Range   Glucose-Capillary 110 (H) 65 - 99 mg/dL  Glucose, capillary     Status: Abnormal   Collection Time: 03/06/16  6:07 AM  Result Value Ref Range   Glucose-Capillary 102 (H) 65 - 99 mg/dL  Glucose, capillary     Status: None   Collection Time: 03/06/16 11:46 AM  Result Value Ref Range   Glucose-Capillary 99 65 - 99 mg/dL   Comment 1 Notify RN    Comment 2 Document in Chart     Blood Alcohol level:  Lab Results  Component Value Date   ETH <5 02/29/2016   ETH 6 (H) 02/13/2016    Physical Findings: AIMS: Facial and Oral Movements Muscles of Facial Expression: None, normal Lips and Perioral Area: None, normal Jaw: None, normal Tongue: None, normal,Extremity Movements Upper (arms, wrists, hands, fingers): None, normal Lower (legs, knees, ankles, toes): None, normal, Trunk Movements Neck, shoulders, hips: None, normal, Overall Severity Severity of abnormal movements (highest score from questions above): None,  normal Incapacitation due to abnormal movements: None, normal Patient's awareness of abnormal movements (rate only patient's report): No Awareness, Dental Status Current problems with teeth and/or dentures?: No Does patient usually wear dentures?: No  CIWA:  CIWA-Ar Total: 2 COWS:     Musculoskeletal: Strength & Muscle Tone: within normal limits Gait & Station: normal Patient leans: N/A  Psychiatric Specialty Exam: Review of Systems  Musculoskeletal: Positive for myalgias.  Psychiatric/Behavioral: Positive for depression, hallucinations, substance abuse and  suicidal ideas. The patient is nervous/anxious.   All other systems reviewed and are negative.   Blood pressure (!) 135/110, pulse 81, temperature 98.8 F (37.1 C), temperature source Oral, resp. rate 18, height 5\' 4"  (1.626 m), weight 96.6 kg (213 lb).Body mass index is 36.56 kg/m.  General Appearance: Casual  Eye Contact::  Fair  Speech:  Clear and Coherent  Volume:  Normal  Mood:  Anxious, Depressed and Dysphoric   Affect:  Depressed  Thought Process:  Goal Directed and Descriptions of Associations: Circumstantial  Orientation:  Full (Time, Place, and Person)  Thought Content:  Hallucinations: Auditory on and off - command asking her to hurt other people  Suicidal Thoughts:  Yes.  with intent/plan  Cut self, but contracts for safety on the unit  Homicidal Thoughts:  has AH asking her to hurt others and self - on and off  Memory:  Immediate;   Fair Recent;   Fair Remote;   Fair  Judgement:  Fair  Insight:  Shallow  Psychomotor Activity:  Restlessness  Concentration:  Fair  Recall:  [employer] of Knowledge:Fair  Language: Fair  Akathisia:  No  Handed:  Right  AIMS (if indicated):     Assets:  Communication Skills Desire for Improvement Resilience  ADL's:  Intact  Cognition: WNL  Sleep:  Number of Hours: 5.5     Treatment Plan Summary:Allante Jerilynn Mages Mikita is a  50 y.o.AA female , who has a hx of schizoaffective do , cocaine, alcohol, cannabis abuse, who voluntarily presents to [facility] s/p SI with plan to cut,  continues to be depressed, and has continued SI with plan on and off.  Will continue treatment.  Daily contact with patient to assess and evaluate symptoms and progress in treatment and Medication management  Will continue CIWA/Librium protocol for alcohol withdrawal sx. Will continue Abilify Maintena IM 400 mg - last dose on 02/20/16- repeat q28 days. Will continue Gabapentin 400 mg po tid for mood lability/anxiety/pain. Will continue Trazodone 175  mg po qhs for  sleep. Increased Trilafon to 4 mg po bid for augmenting the effect of Abilify Maintena IM. Will continue Depakote DR 500 mg po bid for mood lability. Depakote level on 03/06/16 - pending - will reorder. Restarted home medications as needed. Will continue to monitor vitals ,medication compliance and treatment side effects while patient is here.  Will monitor for medical issues as well as call consult as needed.  Reviewed labs cbc - hb - low , cmp- creatinine slightly elevated, TSH - wnl ( 08/10/15) , lipid panel - wnl ( 08/10/15) , hba1c-5.1 ( 11/20/15) ,uds - pos for BZD , cocaine , BAL - <5 , EKG for qtc monitoring-02/14/16- WNL .  CSW will continue working on disposition. Patient to be referred to a substance abuse treatment program/[facility]. Patient to participate in therapeutic milieu .  Darnisha Vernet, MD 03/06/2016, 12:54 PM

## 2016-03-07 LAB — GLUCOSE, CAPILLARY
GLUCOSE-CAPILLARY: 133 mg/dL — AB (ref 65–99)
GLUCOSE-CAPILLARY: 154 mg/dL — AB (ref 65–99)
Glucose-Capillary: 128 mg/dL — ABNORMAL HIGH (ref 65–99)
Glucose-Capillary: 172 mg/dL — ABNORMAL HIGH (ref 65–99)

## 2016-03-07 LAB — VALPROIC ACID LEVEL: Valproic Acid Lvl: 76 ug/mL (ref 50.0–100.0)

## 2016-03-07 NOTE — Progress Notes (Signed)
Adult Psychoeducational Group Note  Date:  03/07/2016 Time:  9:52 PM  Group Topic/Focus:  Wrap-Up Group:   The focus of this group is to help patients review their daily goal of treatment and discuss progress on daily workbooks.   Participation Level:  Minimal  Participation Quality:  Appropriate  Affect:  Flat  Cognitive:  Oriented  Insight: Appropriate  Engagement in Group:  Engaged  Modes of Intervention:  Socialization and Support  Additional Comments:  Patient attended and participated in group tonight. She reports that her day was a 8 She went to her meals and groups today. She stayed up all day. She colored and talked with peers.  Salley Scarlet [facility] 03/07/2016, 9:52 PM

## 2016-03-07 NOTE — Progress Notes (Signed)
[facility] MD Progress Note  03/07/2016 11:56 AM Maureen Swanson  MRN:  [id number] Subjective:  Patient reports " I am ok , I still feel want to hurt myself with a sharp object - but I will ask for help if I cannot cope with the thoughts. My voices came back last night - asked me to hurt self or others."    Objective: Maureen Swanson  is a 50 y.o. AA female, who is divorced , has a hx of schizoaffective do as well as cocaine abuse, alcohol abuse , stimulant abuse , who presented [facility] ED with worsening depression and SI with plan to cut self. Maureen Swanson is well known to the [facility] unit .  Maureen Swanson today continues to have thoughts about wanting to hurt self with a sharp object - however contracts for safety on the unit . Maureen Swanson - continues to have Maureen Swanson that is command - that comes and goes . However , overall she reports today she is more improved than yesterday . Per RN - pt continues to take medications , denies ADRs, is noted as attending groups. Maureen Swanson continues to need encouragement and support.   Principal Problem: Schizoaffective disorder, bipolar type ([place]) Diagnosis:   Patient Active Problem List   Diagnosis Date Noted  . Hypomagnesemia [E83.42]   . Cannabis use disorder, moderate, dependence ([place]) [F12.20] 11/20/2015  . Homicidal ideation [R45.850]   . Tobacco use disorder [F17.200] 07/20/2015  . Diabetes mellitus ([place]) [E11.9] 07/20/2015  . Cocaine use disorder, moderate, dependence ([place]) [F14.20] 04/03/2015  . Schizoaffective disorder, bipolar type ([place]) [F25.0] 04/03/2015  . Alcohol use disorder, moderate, dependence ([place]) [F10.20] 04/03/2015  . Acute ischemic stroke ([place]) [I63.9] 12/08/2014  . Left-sided weakness [M62.89] 12/08/2014  . 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 Knee pain [M25.569] 10/17/2010  . MAMMOGRAM, ABNORMAL, RIGHT [R92.8] 08/05/2010  . Dyslipidemia [E78.5] 06/21/2010  . AMENORRHEA, SECONDARY [N91.2] 10/29/2009  . 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 GERD  [K21.9] 10/11/2007  . Essential hypertension [I10] 02/26/2007   Total Time spent with patient: 25 minutes  Past Psychiatric History: as per H&P.  Past Medical History:  Past Medical History:  Diagnosis Date  . Anxiety   . Arthritis    back and knees  . Asthma    daily and prn inhalers  . 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 Bipolar 1 disorder ([place])   . CHF (congestive heart failure) ([place])   . Depression   . Diabetes mellitus    diet-controlled  . Gastric ulcer   . GERD (gastroesophageal reflux disease)   . Gout   . Headache(784.0)    migraines  . High cholesterol   . Hypertension    under control, has been on med. x 12 yrs.  . Substance abuse    crack cocaine  . TMJ (temporomandibular joint disorder)     Past Surgical History:  Procedure Laterality Date  . BREAST LUMPECTOMY WITH NEEDLE LOCALIZATION  04/19/2012   Procedure: BREAST LUMPECTOMY WITH NEEDLE LOCALIZATION;  Surgeon: [name], MD;  Location: [place];  Service: General;  Laterality: Right;  . KNEE ARTHROSCOPY W/ PARTIAL MEDIAL MENISCECTOMY  05/01/2010   right   Family History:  Family History  Problem Relation Age of Onset  . Diabetes Mother   . Breast cancer Mother   . Cancer Father   . Bipolar disorder Maternal Aunt   . Schizophrenia Maternal Grandfather   . Alcoholism Maternal Uncle  Family Psychiatric  History: See Above Social History: As per H&P. History  Alcohol Use  . 0.0 oz/week    Comment: reports she drinks 3 40 ounce beers daily     History  Drug Use  . Frequency: 5.0 times per week  . Types: Cocaine, Benzodiazepines    Comment: 02/28/16    Social History   Social History  . Marital status: Single    Spouse name: N/A  . Number of children: N/A  . Years of education: N/A   Social History Main Topics  . Smoking status: Current Every Day Smoker    Packs/day: 1.00    Years: 0.00    Types: Cigarettes  . Smokeless tobacco: Never  Used  . Alcohol use 0.0 oz/week     Comment: reports she drinks 3 40 ounce beers daily  . Drug use:     Frequency: 5.0 times per week    Types: Cocaine, Benzodiazepines     Comment: 02/28/16  . Sexual activity: No   Other Topics Concern  . None   Social History Narrative  . None   Additional Social History:    Pain Medications: See PTA Prescriptions: See PTA Over the Counter: See PTA History of alcohol / drug use?: Yes Longest period of sobriety (when/how long): 2 years Negative Consequences of Use: Financial, Legal Withdrawal Symptoms: Agitation Name of Substance 1: Cocaine 1 - Age of First Use: 22 1 - Amount (size/oz): $200 worth 1 - Frequency: Monthly/weekly 1 - Duration: Ongoing 1 - Last Use / Amount: 02/28/16                  Sleep: Fair   Appetite:  Fair  Current Medications: Current Facility-Administered Medications  Medication Dose Route Frequency Provider Last Rate Last Dose  . acetaminophen (TYLENOL) tablet 650 mg  650 mg Oral Q6H PRN Shuvon B Rankin, NP   650 mg at 03/05/16 0843  . alum & mag hydroxide-simeth (MAALOX/MYLANTA) 200-200-20 MG/5ML suspension 30 mL  30 mL Oral Q4H PRN Shuvon B Rankin, NP   30 mL at 03/03/16 0922  . amLODipine (NORVASC) tablet 5 mg  5 mg Oral Daily Shuvon B Rankin, NP   5 mg at 03/07/16 0908  . [START ON 03/19/2016] ARIPiprazole ER SUSR 400 mg  400 mg Intramuscular Q28 days [name], MD      . atenolol (TENORMIN) tablet 100 mg  100 mg Oral Daily Shuvon B Rankin, NP   100 mg at 03/07/16 0909  . atorvastatin (LIPITOR) tablet 40 mg  40 mg Oral q1800 Shuvon B Rankin, NP   40 mg at 03/06/16 1713  . benztropine (COGENTIN) tablet 0.5 mg  0.5 mg Oral QHS Shuvon B Rankin, NP   0.5 mg at 03/06/16 2150  . divalproex (DEPAKOTE) DR tablet 500 mg  500 mg Oral Q12H Shuvon B Rankin, NP   500 mg at 03/07/16 0909  . gabapentin (NEURONTIN) capsule 400 mg  400 mg Oral TID Shuvon B Rankin, NP   400 mg at 03/07/16 0910  . hydrOXYzine  (ATARAX/VISTARIL) tablet 25 mg  25 mg Oral Q6H PRN [name], MD   25 mg at 03/06/16 1443  . insulin aspart (novoLOG) injection 0-15 Units  0-15 Units Subcutaneous TID WC [name], MD   2 Units at 03/07/16 0629  . insulin aspart (novoLOG) injection 0-5 Units  0-5 Units Subcutaneous QHS Herson Prichard, MD      . insulin glargine (LANTUS) injection 24 Units  24 Units  Subcutaneous QHS [name], PA-C   24 Units at 03/06/16 2148  . linagliptin (TRADJENTA) tablet 5 mg  5 mg Oral Daily Shuvon B Rankin, NP   5 mg at 03/07/16 0910  . magnesium hydroxide (MILK OF MAGNESIA) suspension 30 mL  30 mL Oral Daily PRN Shuvon B Rankin, NP      . metFORMIN (GLUCOPHAGE) tablet 1,000 mg  1,000 mg Oral BID WC Shuvon B Rankin, NP   1,000 mg at 03/07/16 0910  . multivitamin with minerals tablet 1 tablet  1 tablet Oral Daily [name], MD   1 tablet at 03/07/16 0910  . OLANZapine (ZYPREXA) tablet 5 mg  5 mg Oral TID PRN [name], MD   5 mg at 03/05/16 1535   Or  . OLANZapine (ZYPREXA) injection 5 mg  5 mg Intramuscular TID PRN [name], MD      . perphenazine (TRILAFON) tablet 4 mg  4 mg Oral BH-qamhs Dariel Betzer, MD   4 mg at 03/07/16 0911  . thiamine (VITAMIN B-1) tablet 100 mg  100 mg Oral Daily [name], MD   100 mg at 03/07/16 0911  . traZODone (DESYREL) tablet 175 mg  175 mg Oral QHS [name], MD   175 mg at 03/06/16 2149    Lab Results:  Results for orders placed or performed during the hospital encounter of 03/02/16 (from the past 48 hour(s))  Glucose, capillary     Status: Abnormal   Collection Time: 03/05/16 12:09 PM  Result Value Ref Range   Glucose-Capillary 113 (H) 65 - 99 mg/dL  Glucose, capillary     Status: Abnormal   Collection Time: 03/05/16  4:58 PM  Result Value Ref Range   Glucose-Capillary 135 (H) 65 - 99 mg/dL  Glucose, capillary     Status: Abnormal   Collection Time: 03/05/16  8:42 PM  Result Value Ref Range   Glucose-Capillary 110 (H) 65  - 99 mg/dL  Glucose, capillary     Status: Abnormal   Collection Time: 03/06/16  6:07 AM  Result Value Ref Range   Glucose-Capillary 102 (H) 65 - 99 mg/dL  Glucose, capillary     Status: None   Collection Time: 03/06/16 11:46 AM  Result Value Ref Range   Glucose-Capillary 99 65 - 99 mg/dL   Comment 1 Notify RN    Comment 2 Document in Chart   Glucose, capillary     Status: Abnormal   Collection Time: 03/06/16  5:08 PM  Result Value Ref Range   Glucose-Capillary 155 (H) 65 - 99 mg/dL   Comment 1 Notify RN    Comment 2 Document in Chart   Glucose, capillary     Status: Abnormal   Collection Time: 03/06/16  8:23 PM  Result Value Ref Range   Glucose-Capillary 149 (H) 65 - 99 mg/dL   Comment 1 Notify RN   Glucose, capillary     Status: Abnormal   Collection Time: 03/07/16  5:39 AM  Result Value Ref Range   Glucose-Capillary 133 (H) 65 - 99 mg/dL   Comment 1 Notify RN   Valproic acid level     Status: None   Collection Time: 03/07/16  6:12 AM  Result Value Ref Range   Valproic Acid Lvl 76 50.0 - 100.0 ug/mL    Comment: Performed at [facility]  Glucose, capillary     Status: Abnormal   Collection Time: 03/07/16 11:38 AM  Result Value Ref Range   Glucose-Capillary  128 (H) 65 - 99 mg/dL   Comment 1 Notify RN    Comment 2 Document in Chart     Blood Alcohol level:  Lab Results  Component Value Date   ETH <5 02/29/2016   ETH 6 (H) 02/13/2016    Physical Findings: AIMS: Facial and Oral Movements Muscles of Facial Expression: None, normal Lips and Perioral Area: None, normal Jaw: None, normal Tongue: None, normal,Extremity Movements Upper (arms, wrists, hands, fingers): None, normal Lower (legs, knees, ankles, toes): None, normal, Trunk Movements Neck, shoulders, hips: None, normal, Overall Severity Severity of abnormal movements (highest score from questions above): None, normal Incapacitation due to abnormal movements: None, normal Patient's  awareness of abnormal movements (rate only patient's report): No Awareness, Dental Status Current problems with teeth and/or dentures?: No Does patient usually wear dentures?: No  CIWA:  CIWA-Ar Total: 2 COWS:     Musculoskeletal: Strength & Muscle Tone: within normal limits Gait & Station: normal Patient leans: N/A  Psychiatric Specialty Exam: Review of Systems  Musculoskeletal: Positive for myalgias.  Psychiatric/Behavioral: Positive for depression, hallucinations, substance abuse and suicidal ideas. The patient is nervous/anxious.   All other systems reviewed and are negative.   Blood pressure (!) 134/93, pulse 84, temperature 98.8 F (37.1 C), temperature source Oral, resp. rate 18, height 5\' 4"  (1.626 m), weight 96.6 kg (213 lb).Body mass index is 36.56 kg/m.  General Appearance: Casual  Eye Contact::  Fair  Speech:  Clear and Coherent  Volume:  Normal  Mood:  Anxious, Depressed and Dysphoric   Affect:  Depressed  Thought Process:  Goal Directed and Descriptions of Associations: Circumstantial  Orientation:  Full (Time, Place, and Person)  Thought Content:  Hallucinations: Auditory on and off - command asking her to hurt other people  Suicidal Thoughts:  wants to hurt self with a sharp object  Cut self, but contracts for safety on the unit  Homicidal Thoughts:  has AH asking her to hurt others and self - on and off  Memory:  Immediate;   Fair Recent;   Fair Remote;   Fair  Judgement:  Fair  Insight:  Shallow  Psychomotor Activity:  Restlessness  Concentration:  Fair  Recall:  [employer] of Knowledge:Fair  Language: Fair  Akathisia:  No  Handed:  Right  AIMS (if indicated):     Assets:  Communication Skills Desire for Improvement Resilience  ADL's:  Intact  Cognition: WNL  Sleep:  Number of Hours: 6.5     Treatment Plan Summary:Iveliz Jerilynn Mages Roopnarine is a  50 y.o.AA female , who has a hx of schizoaffective do , cocaine, alcohol, cannabis abuse, who voluntarily presents  to [facility] s/p SI with plan to cut,  continues to be depressed, and has continued SI with plan on and off.  Will continue treatment.  Daily contact with patient to assess and evaluate symptoms and progress in treatment and Medication management  Will continue CIWA/Librium protocol for alcohol withdrawal sx. Will continue Abilify Maintena IM 400 mg - last dose on 02/20/16- repeat q28 days. Will continue Gabapentin 400 mg po tid for mood lability/anxiety/pain. Will continue Trazodone 175  mg po qhs for sleep. Increased Trilafon to 4 mg po bid for augmenting the effect of Abilify Maintena IM. Will continue Depakote DR 500 mg po bid for mood lability. Depakote level on 03/07/16 -76 Restarted home medications as needed. Will continue to monitor vitals ,medication compliance and treatment side effects while patient is here.  Will monitor for medical  issues as well as call consult as needed.  Reviewed labs cbc - hb - low , cmp- creatinine slightly elevated, TSH - wnl ( 08/10/15) , lipid panel - wnl ( 08/10/15) , hba1c-5.1 ( 11/20/15) ,uds - pos for BZD , cocaine , BAL - <5 , EKG for qtc monitoring-02/14/16- WNL .  CSW will continue working on disposition. Patient to be referred to a substance abuse treatment program/ACTT/PATH. Patient to participate in therapeutic milieu .  Debbora Ang, MD 03/07/2016, 11:56 AM

## 2016-03-07 NOTE — B[facility] Group Notes (Signed)
[place] LCSW Group Therapy  03/07/2016  1:05 PM  Type of Therapy:  Group therapy  Participation Level:  Active  Participation Quality:  Attentive  Affect:  Flat  Cognitive:  Oriented  Insight:  Limited  Engagement in Therapy:  Limited  Modes of Intervention:  Discussion, Socialization  Summary of Progress/Problems:  Chaplain was here to lead a group on themes of hope and courage.  "I need courage to stay awake and participate in the group."  Didn't really make sense.  "I feel tired and hopeful." [name] 03/07/2016 1:05 PM

## 2016-03-07 NOTE — Progress Notes (Signed)
Data. Patient endorses SI/HI/AVH. She reports that she has, "Voices", that are, "Telling me to hurt other people and to kill myself". Her affect is blunt and her mood is anxious and depressed by her own report. Patient interacting [facility] staff and other patients, though she is spending time in the day room. She does not initiate interactions, however, but will respond appropriately when engaged. On her self assessment she reports 8/1o for anxiety, depression and hopelessness. Her goal for today is:"voices-suicide-Biplonr". Action. Emotional support and encouragement offered. Education provided on medication, indications and side effect. Q 15 minute checks done for safety. Response. Safety on the unit maintained through 15 minute checks.  Medications taken as prescribed. Attended groups. Remained calm and appropriate through out shift.

## 2016-03-07 NOTE — Progress Notes (Signed)
Recreation Therapy Notes  Date: 03/07/16 Time: 1000 Location: 500 Hall Dayroom  Group Topic: Communication, Team Building, Problem Solving  Goal Area(s) Addresses:  Patient will effectively work with peer towards shared goal.  Patient will identify skill used to make activity successful.  Patient will identify how skills used during activity can be used to reach post d/c goals.   Behavioral Response: Engaged  Intervention: STEM Activity   Activity: Metallurgist. In teams, patients were asked to build the tallest freestanding tower possible out of 15 pipe cleaners. Systematically resources were removed, for example patient ability to use both hands and patient ability to verbally communicate.    Education: Education officer, community, Dentist.   Education Outcome: Acknowledges education/In group clarification offered/Needs additional education.   Clinical Observations/Feedback: Pt was bright and smiling at times during the group.  Pt stated she used teamwork to complete the activity.  Pt expressed that using teamwork will help her to "talk to people especially staff and keep me from wanting to break up things".    Victorino Sparrow, LRT/CTRS     Victorino Sparrow A 03/07/2016 11:58 AM

## 2016-03-07 NOTE — Tx Team (Signed)
Interdisciplinary Treatment and Diagnostic Plan Update  03/07/2016 Time of Session: 8:45 AM Maureen Swanson MRN: [id number]  Principal Diagnosis: Schizoaffective disorder, bipolar type ([place])  Secondary Diagnoses: Principal Problem:   Schizoaffective disorder, bipolar type ([place]) Active Problems:   Essential hypertension   Cocaine use disorder, moderate, dependence (HCC)   Alcohol use disorder, moderate, dependence (HCC)   Tobacco use disorder   Diabetes mellitus ([place])   Cannabis use disorder, moderate, dependence ([place])   Current Medications:  Current Facility-Administered Medications  Medication Dose Route Frequency Provider Last Rate Last Dose  . acetaminophen (TYLENOL) tablet 650 mg  650 mg Oral Q6H PRN Shuvon B Rankin, NP   650 mg at 03/05/16 0843  . alum & mag hydroxide-simeth (MAALOX/MYLANTA) 200-200-20 MG/5ML suspension 30 mL  30 mL Oral Q4H PRN Shuvon B Rankin, NP   30 mL at 03/03/16 0922  . amLODipine (NORVASC) tablet 5 mg  5 mg Oral Daily Shuvon B Rankin, NP   5 mg at 03/07/16 0908  . [START ON 03/19/2016] ARIPiprazole ER SUSR 400 mg  400 mg Intramuscular Q28 days [name], MD      . atenolol (TENORMIN) tablet 100 mg  100 mg Oral Daily Shuvon B Rankin, NP   100 mg at 03/07/16 0909  . atorvastatin (LIPITOR) tablet 40 mg  40 mg Oral q1800 Shuvon B Rankin, NP   40 mg at 03/06/16 1713  . benztropine (COGENTIN) tablet 0.5 mg  0.5 mg Oral QHS Shuvon B Rankin, NP   0.5 mg at 03/06/16 2150  . divalproex (DEPAKOTE) DR tablet 500 mg  500 mg Oral Q12H Shuvon B Rankin, NP   500 mg at 03/07/16 0909  . gabapentin (NEURONTIN) capsule 400 mg  400 mg Oral TID Shuvon B Rankin, NP   400 mg at 03/07/16 0910  . hydrOXYzine (ATARAX/VISTARIL) tablet 25 mg  25 mg Oral Q6H PRN [name], MD   25 mg at 03/06/16 1443  . insulin aspart (novoLOG) injection 0-15 Units  0-15 Units Subcutaneous TID WC [name], MD   2 Units at 03/07/16 0629  . insulin aspart (novoLOG) injection 0-5 Units  0-5  Units Subcutaneous QHS Saramma Eappen, MD      . insulin glargine (LANTUS) injection 24 Units  24 Units Subcutaneous QHS [name], PA-C   24 Units at 03/06/16 2148  . linagliptin (TRADJENTA) tablet 5 mg  5 mg Oral Daily Shuvon B Rankin, NP   5 mg at 03/07/16 0910  . magnesium hydroxide (MILK OF MAGNESIA) suspension 30 mL  30 mL Oral Daily PRN Shuvon B Rankin, NP      . metFORMIN (GLUCOPHAGE) tablet 1,000 mg  1,000 mg Oral BID WC Shuvon B Rankin, NP   1,000 mg at 03/07/16 0910  . multivitamin with minerals tablet 1 tablet  1 tablet Oral Daily [name], MD   1 tablet at 03/07/16 0910  . OLANZapine (ZYPREXA) tablet 5 mg  5 mg Oral TID PRN [name], MD   5 mg at 03/05/16 1535   Or  . OLANZapine (ZYPREXA) injection 5 mg  5 mg Intramuscular TID PRN [name], MD      . perphenazine (TRILAFON) tablet 4 mg  4 mg Oral BH-qamhs Saramma Eappen, MD   4 mg at 03/07/16 0911  . thiamine (VITAMIN B-1) tablet 100 mg  100 mg Oral Daily [name], MD   100 mg at 03/07/16 0911  . traZODone (DESYREL) tablet 175 mg  175 mg Oral QHS  [name], MD   175 mg at 03/06/16 2149   PTA Medications: Prescriptions Prior to Admission  Medication Sig Dispense Refill Last Dose  . amLODipine (NORVASC) 5 MG tablet Take 1 tablet (5 mg total) by mouth daily. 30 tablet 0 02/28/2016 at Unknown time  . [START ON 03/19/2016] ARIPiprazole ER 400 MG SUSR Inject 400 mg into the muscle every 28 (twenty-eight) days. Due next Oct 4.  Last dose was given Sept 6. 1 each 0 a week  . atenolol (TENORMIN) 100 MG tablet Take 1 tablet (100 mg total) by mouth daily. 30 tablet 0 02/28/2016 at 0900  . atorvastatin (LIPITOR) 40 MG tablet Take 1 tablet (40 mg total) by mouth daily at 6 PM. 30 tablet 0 02/28/2016 at Unknown time  . benztropine (COGENTIN) 0.5 MG tablet Take 1 tablet (0.5 mg total) by mouth at bedtime. 30 tablet 0 02/28/2016 at Unknown time  . divalproex (DEPAKOTE) 250 MG DR tablet Take 1 tablet (250 mg total) by  mouth every 8 (eight) hours. 90 tablet 0 02/28/2016 at Unknown time  . gabapentin (NEURONTIN) 400 MG capsule Take 1 capsule (400 mg total) by mouth 3 (three) times daily. 90 capsule 0 02/28/2016 at Unknown time  . hydrOXYzine (ATARAX/VISTARIL) 25 MG tablet Take 1 tablet (25 mg total) by mouth 3 (three) times daily as needed for anxiety. 30 tablet 0 Past Week at Unknown time  . insulin glargine (LANTUS) 100 UNIT/ML injection Inject 30 Units into the skin at bedtime.   02/28/2016 at Unknown time  . linagliptin (TRADJENTA) 5 MG TABS tablet Take 1 tablet (5 mg total) by mouth daily. 30 tablet 0 02/28/2016 at Unknown time  . metFORMIN (GLUCOPHAGE) 1000 MG tablet Take 1 tablet (1,000 mg total) by mouth 2 (two) times daily with a meal. 60 tablet 0 02/28/2016 at Unknown time  . Multiple Vitamin (MULTIVITAMIN WITH MINERALS) TABS tablet Take 1 tablet by mouth daily.   02/28/2016 at Unknown time  . perphenazine (TRILAFON) 2 MG tablet Take 1 tablet (2 mg total) by mouth 2 (two) times daily in the am and at bedtime.. 60 tablet 0 02/28/2016 at Unknown time  . traZODone (DESYREL) 150 MG tablet Take 1 tablet (150 mg total) by mouth at bedtime. 30 tablet 0 02/28/2016 at Unknown time    Treatment Modalities: Medication Management, Group therapy, Case management,  1 to 1 session with clinician, Psychoeducation, Recreational therapy.   Physician Treatment Plan for Primary Diagnosis: Schizoaffective disorder, bipolar type ([place]) Long Term Goal(s): Improvement in symptoms so as ready for discharge   Short Term Goals: Ability to identify triggers associated with substance abuse/mental health issues will improve  Medication Management: Evaluate patient's response, side effects, and tolerance of medication regimen.  Therapeutic Interventions: 1 to 1 sessions, Unit Group sessions and Medication administration.  Evaluation of Outcomes: Not Met  Physician Treatment Plan for Secondary Diagnosis: Principal Problem:    Schizoaffective disorder, bipolar type ([place]) Active Problems:   Essential hypertension   Cocaine use disorder, moderate, dependence (HCC)   Alcohol use disorder, moderate, dependence (HCC)   Tobacco use disorder   Diabetes mellitus ([place])   Cannabis use disorder, moderate, dependence ([place])  Long Term Goal(s): Improvement in symptoms so as ready for discharge  Short Term Goals: Ability to disclose and discuss suicidal ideas  Medication Management: Evaluate patient's response, side effects, and tolerance of medication regimen.  Therapeutic Interventions: 1 to 1 sessions, Unit Group sessions and Medication administration.  Evaluation of Outcomes: Progressing  RN Treatment Plan for Primary Diagnosis: Schizoaffective disorder, bipolar type ([place]) Long Term Goal(s): Knowledge of disease and therapeutic regimen to maintain health will improve  Short Term Goals: Ability to disclose and discuss suicidal ideas, Ability to identify and develop effective coping behaviors will improve and Compliance with prescribed medications will improve  Medication Management: RN will administer medications as ordered by provider, will assess and evaluate patient's response and provide education to patient for prescribed medication. RN will report any adverse and/or side effects to prescribing provider.  Therapeutic Interventions: 1 on 1 counseling sessions, Psychoeducation, Medication administration, Evaluate responses to treatment, Monitor vital signs and CBGs as ordered, Perform/monitor CIWA, COWS, AIMS and Fall Risk screenings as ordered, Perform wound care treatments as ordered.  Evaluation of Outcomes: Progressing   LCSW Treatment Plan for Primary Diagnosis: Schizoaffective disorder, bipolar type ([place]) Long Term Goal(s): Safe transition to appropriate next level of care at discharge, Engage patient in therapeutic group addressing interpersonal concerns.  Short Term Goals: Engage patient in aftercare  planning with referrals and resources, Increase social support, Increase emotional regulation and Increase skills for wellness and recovery  Therapeutic Interventions: Assess for all discharge needs, 1 to 1 time with Social worker, Explore available resources and support systems, Assess for adequacy in community support network, Educate family and significant other(s) on suicide prevention, Complete Psychosocial Assessment, Interpersonal group therapy.  Evaluation of Outcomes: Met  Pt plans to go to shelter from here, follow up with ACT team    Progress in Treatment: Attending groups: Yes. Participating in groups: Yes. Taking medication as prescribed: Yes. Toleration medication: Yes. Family/Significant other contact made: Yes Patient understands diagnosis: No. and As evidenced by:  unclear whether patient understands impact of substance use and medication non adherence on wellness Discussing patient identified problems/goals with staff: Yes. Medical problems stabilized or resolved: Yes. Denies suicidal/homicidal ideation: No. and As evidenced by:  admitted after expressing SI, coping skills inadequate for environmental stressors Issues/concerns per patient self-inventory: No. Other: none  New problem(s) identified: Yes, Describe:  patient wants help in "staying out of the hospital", coping skills  New Short Term/Long Term Goal(s): meeting w [place] program re shelter options  Discharge Plan or Barriers: homeless, little support in community  Reason for Continuation of Hospitalization: Depression Medication stabilization Suicidal ideation  Estimated Length of Stay: 3 - 5 days  Attendees: Patient: 03/07/2016 11:22 AM  Physician: Charlcie Cradle MD 03/07/2016 11:22 AM  Nursing: Alison Murray RN 03/07/2016 11:22 AM  RN Care Manager: Addison Naegeli RN CM 03/07/2016 11:22 AM  Social Worker: Ripley Fraise LCSW 03/07/2016 11:22 AM  Recreational Therapist: Corliss Skains LRT 03/07/2016 11:22 AM  Other:  03/07/2016  11:22 AM  Other:  03/07/2016 11:22 AM  Other: 03/07/2016 11:22 AM    Scribe for Treatment Team: [name], LCSW 03/07/2016 11:22 AM

## 2016-03-08 LAB — GLUCOSE, CAPILLARY
Glucose-Capillary: 99 mg/dL (ref 65–99)
Glucose-Capillary: 88 mg/dL (ref 65–99)
Glucose-Capillary: 143 mg/dL — ABNORMAL HIGH (ref 65–99)
Glucose-Capillary: 106 mg/dL — ABNORMAL HIGH (ref 65–99)

## 2016-03-08 NOTE — Progress Notes (Signed)
D: Pt is flat and withdrawn to self even while in the dayroom. Pt at the time of assessment denied depression, anxiety, pain, SI, HI or AVH; Pt continually states "not today" when asked whether or not she experiencing any.  Pt remained guarded, however cooperative. A: Medications offered as prescribed.  Support, encouragement, and safe environment provided.  15-minute safety checks continue. R: Pt was med compliant.  Pt attended wrap-up group. Safety checks continue.

## 2016-03-08 NOTE — Progress Notes (Signed)
D: Pt A & O X3. Presents with depressed affect and mood. Denies SI, HI, AVH and pain when assessed, replied "not right now". Minimal interactions observed with staff and peers and on as need basis. Rates her her depression, anxiety and hopelessness all 8/10.   A: Emotional support and availability provided to pt. Scheduled medications administered as prescribed. Encouraged to voice needs / concerns. Q 15 minutes safety checks maintained on and off unit without gestures or report of self harm behaviors to note thus far.  R: Pt receptive to care. Compliant with medications and CBG monitoring as ordered. Denies adverse drug reactions. Attended scheduled groups on unit. Denies concerns at this time. Remains safe on and off unit. POC continues.

## 2016-03-08 NOTE — Progress Notes (Signed)
[facility] MD Progress Note  03/08/2016 2:04 PM UNIQUE KASEMAN  MRN:  [id number] Subjective:  Patient reports " I am ok today."     Objective: DAIRYN EKLOF  is a 50 y.o. AA female, who is divorced , has a hx of schizoaffective do as well as cocaine abuse, alcohol abuse , stimulant abuse , who presented [facility] ED with worsening depression and SI with plan to cut self. Brittant is well known to the [facility] .  Anvi today continues to have thoughts about wanting to hurt self, however states it is improving. She also reports AH as improving . Per RN - pt often noted as sad, anxious , however she continues to take medications , denies ADRs, is noted as attending groups. Basia continues to need encouragement and support.   Principal Problem: Schizoaffective disorder, bipolar type ([place]) Diagnosis:   Patient Active Problem List   Diagnosis Date Noted  . Hypomagnesemia [E83.42]   . Cannabis use disorder, moderate, dependence ([place]) [F12.20] 11/20/2015  . Homicidal ideation [R45.850]   . Tobacco use disorder [F17.200] 07/20/2015  . Diabetes mellitus ([place]) [E11.9] 07/20/2015  . Cocaine use disorder, moderate, dependence ([place]) [F14.20] 04/03/2015  . Schizoaffective disorder, bipolar type ([place]) [F25.0] 04/03/2015  . Alcohol use disorder, moderate, dependence ([place]) [F10.20] 04/03/2015  . Acute ischemic stroke ([place]) [I63.9] 12/08/2014  . Left-sided weakness [M62.89] 12/08/2014  . 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 Knee pain [M25.569] 10/17/2010  . MAMMOGRAM, ABNORMAL, RIGHT [R92.8] 08/05/2010  . Dyslipidemia [E78.5] 06/21/2010  . AMENORRHEA, SECONDARY [N91.2] 10/29/2009  . 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 GERD [K21.9] 10/11/2007  . Essential hypertension [I10] 02/26/2007   Total Time spent with patient: 15 minutes  Past Psychiatric History: as per H&P.  Past Medical History:  Past Medical History:  Diagnosis Date  . Anxiety   . Arthritis    back and knees  . Asthma     daily and prn inhalers  . 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 Bipolar 1 disorder ([place])   . CHF (congestive heart failure) ([place])   . Depression   . Diabetes mellitus    diet-controlled  . Gastric ulcer   . GERD (gastroesophageal reflux disease)   . Gout   . Headache(784.0)    migraines  . High cholesterol   . Hypertension    under control, has been on med. x 12 yrs.  . Substance abuse    crack cocaine  . TMJ (temporomandibular joint disorder)     Past Surgical History:  Procedure Laterality Date  . BREAST LUMPECTOMY WITH NEEDLE LOCALIZATION  04/19/2012   Procedure: BREAST LUMPECTOMY WITH NEEDLE LOCALIZATION;  Surgeon: [name], MD;  Location: [place];  Service: General;  Laterality: Right;  . KNEE ARTHROSCOPY W/ PARTIAL MEDIAL MENISCECTOMY  05/01/2010   right   Family History:  Family History  Problem Relation Age of Onset  . Diabetes Mother   . Breast cancer Mother   . Cancer Father   . Bipolar disorder Maternal Aunt   . Schizophrenia Maternal Grandfather   . Alcoholism Maternal Uncle    Family Psychiatric  History: See Above Social History: As per H&P. History  Alcohol Use  . 0.0 oz/week    Comment: reports she drinks 3 40 ounce beers daily     History  Drug Use  . Frequency: 5.0 times per week  . Types: Cocaine, Benzodiazepines    Comment: 02/28/16  Social History   Social History  . Marital status: Single    Spouse name: N/A  . Number of children: N/A  . Years of education: N/A   Social History Main Topics  . Smoking status: Current Every Day Smoker    Packs/day: 1.00    Years: 0.00    Types: Cigarettes  . Smokeless tobacco: Never Used  . Alcohol use 0.0 oz/week     Comment: reports she drinks 3 40 ounce beers daily  . Drug use:     Frequency: 5.0 times per week    Types: Cocaine, Benzodiazepines     Comment: 02/28/16  . Sexual activity: No   Other Topics Concern  . None   Social History  Narrative  . None   Additional Social History:    Pain Medications: See PTA Prescriptions: See PTA Over the Counter: See PTA History of alcohol / drug use?: Yes Longest period of sobriety (when/how long): 2 years Negative Consequences of Use: Financial, Legal Withdrawal Symptoms: Agitation Name of Substance 1: Cocaine 1 - Age of First Use: 22 1 - Amount (size/oz): $200 worth 1 - Frequency: Monthly/weekly 1 - Duration: Ongoing 1 - Last Use / Amount: 02/28/16                  Sleep: Fair   Appetite:  Fair  Current Medications: Current Facility-Administered Medications  Medication Dose Route Frequency Provider Last Rate Last Dose  . acetaminophen (TYLENOL) tablet 650 mg  650 mg Oral Q6H PRN Shuvon B Rankin, NP   650 mg at 03/05/16 0843  . alum & mag hydroxide-simeth (MAALOX/MYLANTA) 200-200-20 MG/5ML suspension 30 mL  30 mL Oral Q4H PRN Shuvon B Rankin, NP   30 mL at 03/03/16 0922  . amLODipine (NORVASC) tablet 5 mg  5 mg Oral Daily Shuvon B Rankin, NP   5 mg at 03/08/16 0844  . [START ON 03/19/2016] ARIPiprazole ER SUSR 400 mg  400 mg Intramuscular Q28 days [name], MD      . atenolol (TENORMIN) tablet 100 mg  100 mg Oral Daily Shuvon B Rankin, NP   100 mg at 03/08/16 0844  . atorvastatin (LIPITOR) tablet 40 mg  40 mg Oral q1800 Shuvon B Rankin, NP   40 mg at 03/07/16 1723  . benztropine (COGENTIN) tablet 0.5 mg  0.5 mg Oral QHS Shuvon B Rankin, NP   0.5 mg at 03/07/16 2129  . divalproex (DEPAKOTE) DR tablet 500 mg  500 mg Oral Q12H Shuvon B Rankin, NP   500 mg at 03/08/16 0845  . gabapentin (NEURONTIN) capsule 400 mg  400 mg Oral TID Shuvon B Rankin, NP   400 mg at 03/08/16 1215  . hydrOXYzine (ATARAX/VISTARIL) tablet 25 mg  25 mg Oral Q6H PRN [name], MD   25 mg at 03/06/16 1443  . insulin aspart (novoLOG) injection 0-15 Units  0-15 Units Subcutaneous TID WC [name], MD   3 Units at 03/07/16 1725  . insulin aspart (novoLOG) injection 0-5 Units  0-5  Units Subcutaneous QHS Brenen Beigel, MD      . insulin glargine (LANTUS) injection 24 Units  24 Units Subcutaneous QHS [name], PA-C   24 Units at 03/07/16 2132  . linagliptin (TRADJENTA) tablet 5 mg  5 mg Oral Daily Shuvon B Rankin, NP   5 mg at 03/08/16 0845  . magnesium hydroxide (MILK OF MAGNESIA) suspension 30 mL  30 mL Oral Daily PRN Shuvon B Rankin, NP      .  metFORMIN (GLUCOPHAGE) tablet 1,000 mg  1,000 mg Oral BID WC Shuvon B Rankin, NP   1,000 mg at 03/08/16 0845  . multivitamin with minerals tablet 1 tablet  1 tablet Oral Daily [name], MD   1 tablet at 03/08/16 0845  . OLANZapine (ZYPREXA) tablet 5 mg  5 mg Oral TID PRN [name], MD   5 mg at 03/05/16 1535   Or  . OLANZapine (ZYPREXA) injection 5 mg  5 mg Intramuscular TID PRN [name], MD      . perphenazine (TRILAFON) tablet 4 mg  4 mg Oral BH-qamhs Georgine Wiltse, MD   4 mg at 03/08/16 0844  . thiamine (VITAMIN B-1) tablet 100 mg  100 mg Oral Daily [name], MD   100 mg at 03/08/16 0845  . traZODone (DESYREL) tablet 175 mg  175 mg Oral QHS [name], MD   175 mg at 03/07/16 2130    Lab Results:  Results for orders placed or performed during the hospital encounter of 03/02/16 (from the past 48 hour(s))  Glucose, capillary     Status: Abnormal   Collection Time: 03/06/16  5:08 PM  Result Value Ref Range   Glucose-Capillary 155 (H) 65 - 99 mg/dL   Comment 1 Notify RN    Comment 2 Document in Chart   Glucose, capillary     Status: Abnormal   Collection Time: 03/06/16  8:23 PM  Result Value Ref Range   Glucose-Capillary 149 (H) 65 - 99 mg/dL   Comment 1 Notify RN   Glucose, capillary     Status: Abnormal   Collection Time: 03/07/16  5:39 AM  Result Value Ref Range   Glucose-Capillary 133 (H) 65 - 99 mg/dL   Comment 1 Notify RN   Valproic acid level     Status: None   Collection Time: 03/07/16  6:12 AM  Result Value Ref Range   Valproic Acid Lvl 76 50.0 - 100.0 ug/mL    Comment:  Performed at [facility]  Glucose, capillary     Status: Abnormal   Collection Time: 03/07/16 11:38 AM  Result Value Ref Range   Glucose-Capillary 128 (H) 65 - 99 mg/dL   Comment 1 Notify RN    Comment 2 Document in Chart   Glucose, capillary     Status: Abnormal   Collection Time: 03/07/16  4:40 PM  Result Value Ref Range   Glucose-Capillary 172 (H) 65 - 99 mg/dL   Comment 1 Notify RN    Comment 2 Document in Chart   Glucose, capillary     Status: Abnormal   Collection Time: 03/07/16  8:30 PM  Result Value Ref Range   Glucose-Capillary 154 (H) 65 - 99 mg/dL  Glucose, capillary     Status: None   Collection Time: 03/08/16  5:57 AM  Result Value Ref Range   Glucose-Capillary 99 65 - 99 mg/dL  Glucose, capillary     Status: None   Collection Time: 03/08/16 11:41 AM  Result Value Ref Range   Glucose-Capillary 88 65 - 99 mg/dL   Comment 1 Notify RN    Comment 2 Document in Chart     Blood Alcohol level:  Lab Results  Component Value Date   ETH <5 02/29/2016   ETH 6 (H) 02/13/2016    Physical Findings: AIMS: Facial and Oral Movements Muscles of Facial Expression: None, normal Lips and Perioral Area: None, normal Jaw: None, normal Tongue: None, normal,Extremity Movements Upper (arms, wrists, hands,  fingers): None, normal Lower (legs, knees, ankles, toes): None, normal, Trunk Movements Neck, shoulders, hips: None, normal, Overall Severity Severity of abnormal movements (highest score from questions above): None, normal Incapacitation due to abnormal movements: None, normal Patient's awareness of abnormal movements (rate only patient's report): No Awareness, Dental Status Current problems with teeth and/or dentures?: No Does patient usually wear dentures?: No  CIWA:  CIWA-Ar Total: 2 COWS:     Musculoskeletal: Strength & Muscle Tone: within normal limits Gait & Station: normal Patient leans: N/A  Psychiatric Specialty Exam: Review of Systems   Psychiatric/Behavioral: Positive for depression, hallucinations and substance abuse. The patient is nervous/anxious.   All other systems reviewed and are negative.   Blood pressure (!) 143/94, pulse 82, temperature 98.2 F (36.8 C), temperature source Oral, resp. rate 20, height 5\' 4"  (1.626 m), weight 96.6 kg (213 lb).Body mass index is 36.56 kg/m.  General Appearance: Casual  Eye Contact::  Fair  Speech:  Clear and Coherent  Volume:  Normal  Mood:  Anxious, Depressed and Dysphoric improving  Affect:  Depressed  Thought Process:  Goal Directed and Descriptions of Associations: Circumstantial  Orientation:  Full (Time, Place, and Person)  Thought Content:  Hallucinations: Auditory on and off - command asking her to hurt other people- improving  Suicidal Thoughts:  Yes.  without intent/plan  Had thoughts to hurt self with sharp object yesterday - is improving  Homicidal Thoughts:  has AH asking her to hurt others and self - on and off, although improving  Memory:  Immediate;   Fair Recent;   Fair Remote;   Fair  Judgement:  Fair  Insight:  Shallow  Psychomotor Activity:  Restlessness  Concentration:  Fair  Recall:  [employer] of Knowledge:Fair  Language: Fair  Akathisia:  No  Handed:  Right  AIMS (if indicated):     Assets:  Communication Skills Desire for Improvement Resilience  ADL's:  Intact  Cognition: WNL  Sleep:  Number of Hours: 5.5     Treatment Plan Summary:Deloise Jerilynn Mages Oswalt is a  50 y.o.AA female , who has a hx of schizoaffective do , cocaine, alcohol, cannabis abuse, who voluntarily presents to [facility] s/p SI with plan to cut,  continues to be depressed, and has continued SI with plan on and off.  Will continue treatment.  Daily contact with patient to assess and evaluate symptoms and progress in treatment and Medication management  Will continue CIWA/Librium protocol for alcohol withdrawal sx. Will continue Abilify Maintena IM 400 mg - last dose on 02/20/16- repeat q28  days. Will continue Gabapentin 400 mg po tid for mood lability/anxiety/pain. Will continue Trazodone 175  mg po qhs for sleep. Increased Trilafon to 4 mg po bid for augmenting the effect of Abilify Maintena IM. Will continue Depakote DR 500 mg po bid for mood lability. Depakote level on 03/07/16 -76 Restarted home medications as needed. Will continue to monitor vitals ,medication compliance and treatment side effects while patient is here.  Will monitor for medical issues as well as call consult as needed.  Reviewed labs cbc - hb - low , cmp- creatinine slightly elevated, TSH - wnl ( 08/10/15) , lipid panel - wnl ( 08/10/15) , hba1c-5.1 ( 11/20/15) ,uds - pos for BZD , cocaine , BAL - <5 , EKG for qtc monitoring-02/14/16- WNL .  CSW will continue working on disposition. Patient to be referred to a substance abuse treatment program/ACTT/PATH. Patient to participate in therapeutic milieu .  Thaine Garriga, MD 03/08/2016,  2:04 PM

## 2016-03-08 NOTE — Progress Notes (Signed)
Adult Psychoeducational Group Note  Date:  03/08/2016 Time:  8:54 PM  Group Topic/Focus:  Wrap-Up Group:   The focus of this group is to help patients review their daily goal of treatment and discuss progress on daily workbooks.   Participation Level:  Active  Participation Quality:  Appropriate  Affect:  Appropriate  Cognitive:  Appropriate  Insight: Appropriate  Engagement in Group:  Improving  Modes of Intervention:  Discussion  Additional Comments:The patient expressed that she rates today a 8.The patient also said that she did not attend groups. Nash Shearer 03/08/2016, 8:54 PM

## 2016-03-08 NOTE — Progress Notes (Signed)
Adult Psychoeducational Group Note  Date:  03/08/2016 Time:  12:07 PM  Group Topic/Focus:  Making Healthy Choices:   The focus of this group is to help patients identify negative/unhealthy choices they were using prior to admission and identify positive/healthier coping strategies to replace them upon discharge.   Participation Level:  Active  Participation Quality:  Appropriate and Attentive  Affect:  Appropriate  Cognitive:  Alert and Appropriate  Insight: Appropriate and Good  Engagement in Group:  Engaged  Modes of Intervention:  Discussion and Education   Mart Piggs 03/08/2016, 12:07 PM

## 2016-03-09 LAB — GLUCOSE, CAPILLARY
GLUCOSE-CAPILLARY: 95 mg/dL (ref 65–99)
GLUCOSE-CAPILLARY: 153 mg/dL — AB (ref 65–99)
GLUCOSE-CAPILLARY: 147 mg/dL — AB (ref 65–99)
GLUCOSE-CAPILLARY: 161 mg/dL — AB (ref 65–99)

## 2016-03-09 NOTE — Progress Notes (Signed)
Patient ID: Maureen Swanson, female   DOB: 10-20-1965, 50 y.o.   MRN: [id number] D: Patient sitting in dayroom watching TV and interacting with peers on approach. Pt reports goal was to stay out of bed. Mood and affect appeared depressed and flat. Pt forwards little and interaction with staff is minimal. Denies SI/HI/AVH and pain.No behavioral issues noted.  A: Support and encouragement offered as needed. Medications administered as prescribed.  R: Patient cooperative and appropriate on unit. Will continue to monitor patient for safety and stability.

## 2016-03-09 NOTE — Progress Notes (Signed)
Maureen Swanson is seen layed back in a chair in the dayroom...watching TV. She raises her eye brows, peers forward and says " what" when this nurse asks her how she's doing.. She remains sad, distant and as usual, flat and depressed. . A She completes her daily assessment and on it she writes that she HAS experienced SI today and but she contracts with this Probation officer to  Not hurt herself .  She rates her depression, hopelessness and anxiety " 8/8/8", respectviely. R Safety is in place and poc cont to focus on placement for discharge and maintianing therapeutic relationship.

## 2016-03-09 NOTE — Progress Notes (Signed)
Maureen C. [facility] MD Progress Note  03/09/2016 12:11 PM CHANELLE SERAPHIN  MRN:  [id number] Subjective:  Patient reports " I am ok today."     Objective: RAHMAH WIN  is a 50 y.o. AA female, who is divorced , has a hx of schizoaffective do as well as cocaine abuse, alcohol abuse , stimulant abuse , who presented [facility] ED with worsening depression and SI with plan to cut self. Braeden is well known to the [facility] .  Harman today denies any SI and reports command AH has improved. Per RN - pt often noted as sad, anxious and requires encouragement , however she continues to take medications , denies ADRs, is noted as attending groups.  Principal Problem: Schizoaffective disorder, bipolar type ([place]) Diagnosis:   Patient Active Problem List   Diagnosis Date Noted  . Hypomagnesemia [E83.42]   . Cannabis use disorder, moderate, dependence ([place]) [F12.20] 11/20/2015  . Homicidal ideation [R45.850]   . Tobacco use disorder [F17.200] 07/20/2015  . Diabetes mellitus ([place]) [E11.9] 07/20/2015  . Cocaine use disorder, moderate, dependence ([place]) [F14.20] 04/03/2015  . Schizoaffective disorder, bipolar type ([place]) [F25.0] 04/03/2015  . Alcohol use disorder, moderate, dependence ([place]) [F10.20] 04/03/2015  . Acute ischemic stroke ([place]) [I63.9] 12/08/2014  . Left-sided weakness [M62.89] 12/08/2014  . 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 Knee pain [M25.569] 10/17/2010  . MAMMOGRAM, ABNORMAL, RIGHT [R92.8] 08/05/2010  . Dyslipidemia [E78.5] 06/21/2010  . AMENORRHEA, SECONDARY [N91.2] 10/29/2009  . 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 GERD [K21.9] 10/11/2007  . Essential hypertension [I10] 02/26/2007   Total Time spent with patient: 15 minutes  Past Psychiatric History: as per H&P.  Past Medical History:  Past Medical History:  Diagnosis Date  . Anxiety   . Arthritis    back and knees  . Asthma    daily and prn inhalers  . 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 Bipolar 1  disorder ([place])   . CHF (congestive heart failure) ([place])   . Depression   . Diabetes mellitus    diet-controlled  . Gastric ulcer   . GERD (gastroesophageal reflux disease)   . Gout   . Headache(784.0)    migraines  . High cholesterol   . Hypertension    under control, has been on med. x 12 yrs.  . Substance abuse    crack cocaine  . TMJ (temporomandibular joint disorder)     Past Surgical History:  Procedure Laterality Date  . BREAST LUMPECTOMY WITH NEEDLE LOCALIZATION  04/19/2012   Procedure: BREAST LUMPECTOMY WITH NEEDLE LOCALIZATION;  Surgeon: [name], MD;  Location: [place];  Service: General;  Laterality: Right;  . KNEE ARTHROSCOPY W/ PARTIAL MEDIAL MENISCECTOMY  05/01/2010   right   Family History:  Family History  Problem Relation Age of Onset  . Diabetes Mother   . Breast cancer Mother   . Cancer Father   . Bipolar disorder Maternal Aunt   . Schizophrenia Maternal Grandfather   . Alcoholism Maternal Uncle    Family Psychiatric  History: See Above Social History: As per H&P. History  Alcohol Use  . 0.0 oz/week    Comment: reports she drinks 3 40 ounce beers daily     History  Drug Use  . Frequency: 5.0 times per week  . Types: Cocaine, Benzodiazepines    Comment: 02/28/16    Social History   Social History  . Marital status: Single  Spouse name: N/A  . Number of children: N/A  . Years of education: N/A   Social History Main Topics  . Smoking status: Current Every Day Smoker    Packs/day: 1.00    Years: 0.00    Types: Cigarettes  . Smokeless tobacco: Never Used  . Alcohol use 0.0 oz/week     Comment: reports she drinks 3 40 ounce beers daily  . Drug use:     Frequency: 5.0 times per week    Types: Cocaine, Benzodiazepines     Comment: 02/28/16  . Sexual activity: No   Other Topics Concern  . None   Social History Narrative  . None   Additional Social History:    Pain Medications: See PTA Prescriptions: See  PTA Over the Counter: See PTA History of alcohol / drug use?: Yes Longest period of sobriety (when/how long): 2 years Negative Consequences of Use: Financial, Legal Withdrawal Symptoms: Agitation Name of Substance 1: Cocaine 1 - Age of First Use: 22 1 - Amount (size/oz): $200 worth 1 - Frequency: Monthly/weekly 1 - Duration: Ongoing 1 - Last Use / Amount: 02/28/16                  Sleep: Fair   Appetite:  Fair  Current Medications: Current Facility-Administered Medications  Medication Dose Route Frequency Provider Last Rate Last Dose  . acetaminophen (TYLENOL) tablet 650 mg  650 mg Oral Q6H PRN Shuvon B Rankin, NP   650 mg at 03/05/16 0843  . alum & mag hydroxide-simeth (MAALOX/MYLANTA) 200-200-20 MG/5ML suspension 30 mL  30 mL Oral Q4H PRN Shuvon B Rankin, NP   30 mL at 03/03/16 0922  . amLODipine (NORVASC) tablet 5 mg  5 mg Oral Daily Shuvon B Rankin, NP   5 mg at 03/09/16 0816  . [START ON 03/19/2016] ARIPiprazole ER SUSR 400 mg  400 mg Intramuscular Q28 days [name], MD      . atenolol (TENORMIN) tablet 100 mg  100 mg Oral Daily Shuvon B Rankin, NP   100 mg at 03/09/16 0816  . atorvastatin (LIPITOR) tablet 40 mg  40 mg Oral q1800 Shuvon B Rankin, NP   40 mg at 03/08/16 1708  . benztropine (COGENTIN) tablet 0.5 mg  0.5 mg Oral QHS Shuvon B Rankin, NP   0.5 mg at 03/08/16 2049  . divalproex (DEPAKOTE) DR tablet 500 mg  500 mg Oral Q12H Shuvon B Rankin, NP   500 mg at 03/09/16 0816  . gabapentin (NEURONTIN) capsule 400 mg  400 mg Oral TID Shuvon B Rankin, NP   400 mg at 03/09/16 1141  . hydrOXYzine (ATARAX/VISTARIL) tablet 25 mg  25 mg Oral Q6H PRN [name], MD   25 mg at 03/06/16 1443  . insulin aspart (novoLOG) injection 0-15 Units  0-15 Units Subcutaneous TID WC [name], MD   3 Units at 03/09/16 1141  . insulin aspart (novoLOG) injection 0-5 Units  0-5 Units Subcutaneous QHS Roberto Hlavaty, MD      . insulin glargine (LANTUS) injection 24 Units  24 Units  Subcutaneous QHS [name], PA-C   24 Units at 03/08/16 2050  . linagliptin (TRADJENTA) tablet 5 mg  5 mg Oral Daily Shuvon B Rankin, NP   5 mg at 03/09/16 0817  . magnesium hydroxide (MILK OF MAGNESIA) suspension 30 mL  30 mL Oral Daily PRN Shuvon B Rankin, NP      . metFORMIN (GLUCOPHAGE) tablet 1,000 mg  1,000 mg Oral BID WC  Shuvon B Rankin, NP   1,000 mg at 03/09/16 0816  . multivitamin with minerals tablet 1 tablet  1 tablet Oral Daily [name], MD   1 tablet at 03/09/16 0817  . OLANZapine (ZYPREXA) tablet 5 mg  5 mg Oral TID PRN [name], MD   5 mg at 03/05/16 1535   Or  . OLANZapine (ZYPREXA) injection 5 mg  5 mg Intramuscular TID PRN [name], MD      . perphenazine (TRILAFON) tablet 4 mg  4 mg Oral BH-qamhs Peytyn Trine, MD   4 mg at 03/09/16 0816  . thiamine (VITAMIN B-1) tablet 100 mg  100 mg Oral Daily [name], MD   100 mg at 03/09/16 0818  . traZODone (DESYREL) tablet 175 mg  175 mg Oral QHS [name], MD   175 mg at 03/08/16 2049    Lab Results:  Results for orders placed or performed during the [facility] encounter of 03/02/16 (from the past 48 hour(s))  Glucose, capillary     Status: Abnormal   Collection Time: 03/07/16  4:40 PM  Result Value Ref Range   Glucose-Capillary 172 (H) 65 - 99 mg/dL   Comment 1 Notify RN    Comment 2 Document in Chart   Glucose, capillary     Status: Abnormal   Collection Time: 03/07/16  8:30 PM  Result Value Ref Range   Glucose-Capillary 154 (H) 65 - 99 mg/dL  Glucose, capillary     Status: None   Collection Time: 03/08/16  5:57 AM  Result Value Ref Range   Glucose-Capillary 99 65 - 99 mg/dL  Glucose, capillary     Status: None   Collection Time: 03/08/16 11:41 AM  Result Value Ref Range   Glucose-Capillary 88 65 - 99 mg/dL   Comment 1 Notify RN    Comment 2 Document in Chart   Glucose, capillary     Status: Abnormal   Collection Time: 03/08/16  4:55 PM  Result Value Ref Range   Glucose-Capillary  143 (H) 65 - 99 mg/dL   Comment 1 Notify RN    Comment 2 Document in Chart   Glucose, capillary     Status: Abnormal   Collection Time: 03/08/16  8:26 PM  Result Value Ref Range   Glucose-Capillary 106 (H) 65 - 99 mg/dL  Glucose, capillary     Status: None   Collection Time: 03/09/16  6:14 AM  Result Value Ref Range   Glucose-Capillary 95 65 - 99 mg/dL  Glucose, capillary     Status: Abnormal   Collection Time: 03/09/16 11:36 AM  Result Value Ref Range   Glucose-Capillary 153 (H) 65 - 99 mg/dL    Blood Alcohol level:  Lab Results  Component Value Date   ETH <5 02/29/2016   ETH 6 (H) 02/13/2016    Physical Findings: AIMS: Facial and Oral Movements Muscles of Facial Expression: None, normal Lips and Perioral Area: None, normal Jaw: None, normal Tongue: None, normal,Extremity Movements Upper (arms, wrists, hands, fingers): None, normal Lower (legs, knees, ankles, toes): None, normal, Trunk Movements Neck, shoulders, hips: None, normal, Overall Severity Severity of abnormal movements (highest score from questions above): None, normal Incapacitation due to abnormal movements: None, normal Patient's awareness of abnormal movements (rate only patient's report): No Awareness, Dental Status Current problems with teeth and/or dentures?: No Does patient usually wear dentures?: No  CIWA:  CIWA-Ar Total: 2 COWS:     Musculoskeletal: Strength & Muscle Tone: within normal limits Gait &  Station: normal Patient leans: N/A  Psychiatric Specialty Exam: Review of Systems  Psychiatric/Behavioral: Positive for depression and substance abuse. The patient is nervous/anxious.   All other systems reviewed and are negative.   Blood pressure (!) 154/98, pulse 75, temperature 98.9 F (37.2 C), temperature source Oral, resp. rate 20, height 5\' 4"  (1.626 m), weight 96.6 kg (213 lb).Body mass index is 36.56 kg/m.  General Appearance: Casual  Eye Contact::  Fair  Speech:  Clear and Coherent   Volume:  Normal  Mood:  Anxious, Depressed and Dysphoric improving  Affect:  Depressed   Thought Process:  Goal Directed and Descriptions of Associations: Circumstantial  Orientation:  Full (Time, Place, and Person)  Thought Content:  Hallucinations: Auditory on and off - command asking her to hurt other people- improving  Suicidal Thoughts:  No    Homicidal Thoughts:  No   Memory:  Immediate;   Fair Recent;   Fair Remote;   Fair  Judgement:  Fair  Insight:  Shallow  Psychomotor Activity:  Restlessness  Concentration:  Fair  Recall:  [employer] of Knowledge:Fair  Language: Fair  Akathisia:  No  Handed:  Right  AIMS (if indicated):     Assets:  Communication Skills Desire for Improvement Resilience  ADL's:  Intact  Cognition: WNL  Sleep:  Number of Hours: 6.25     Treatment Plan Summary:Maureen Swanson is a  50 y.o.AA female , who has a hx of schizoaffective do , cocaine, alcohol, cannabis abuse, who voluntarily presents to [facility] s/p SI with plan to cut,  continues to be depressed,although progressing, her AH is improving , she denies SI .  Will continue treatment.  Daily contact with patient to assess and evaluate symptoms and progress in treatment and Medication management  Will continue CIWA/Librium protocol for alcohol withdrawal sx. Will continue Abilify Maintena IM 400 mg - last dose on 02/20/16- repeat q28 days. Will continue Gabapentin 400 mg po tid for mood lability/anxiety/pain. Will continue Trazodone 175  mg po qhs for sleep. Increased Trilafon to 4 mg po bid for augmenting the effect of Abilify Maintena IM. Will continue Depakote DR 500 mg po bid for mood lability. Depakote level on 03/07/16 -76 Restarted home medications as needed. Will continue to monitor vitals ,medication compliance and treatment side effects while patient is here.  Will monitor for medical issues as well as call consult as needed.  Reviewed labs cbc - hb - low , cmp- creatinine slightly  elevated, TSH - wnl ( 08/10/15) , lipid panel - wnl ( 08/10/15) , hba1c-5.1 ( 11/20/15) ,uds - pos for BZD , cocaine , BAL - <5 , EKG for qtc monitoring-02/14/16- WNL .  CSW will continue working on disposition. Patient to be referred to a substance abuse treatment program/ACTT/PATH. Patient to participate in therapeutic milieu .  Davone Shinault, MD 03/09/2016, 12:11 PM

## 2016-03-09 NOTE — Progress Notes (Signed)
Adult Psychoeducational Group Note  Date:  03/09/2016 Time:  9:09 PM  Group Topic/Focus:  Wrap-Up Group:   The focus of this group is to help patients review their daily goal of treatment and discuss progress on daily workbooks.   Participation Level:  Active  Participation Quality:  Appropriate and Attentive  Affect:  Appropriate  Cognitive:  Appropriate  Insight: Appropriate  Engagement in Group:  Engaged  Modes of Intervention:  Discussion  Additional Comments:  Pt stated her goal for today was to stay out of bed. Pt stated one positive thing is that she got her hair braided today. Clint Bolder 03/09/2016, 9:09 PM

## 2016-03-09 NOTE — BHH Group Notes (Signed)
[place] LCSW Group Therapy  03/09/2016   11:00 AM   Type of Therapy:  Group Therapy  Participation Level:  Active  Participation Quality:  Appropriate and Attentive  Affect:  Flat  Cognitive:  Alert and Appropriate  Insight:  Developing/Improving and Engaged  Engagement in Therapy:  Developing/Improving and Engaged  Modes of Intervention:  Clarification, Confrontation, Discussion, Education, Exploration, Limit-setting, Orientation, Problem-solving, Rapport Building, Art therapist, Socialization and Support  Summary of Progress/Problems: The main focus of today's process group was to identify the patient's current support system and decide on other supports that can be put in place.  An emphasis was placed on using counselor, doctor, therapy groups, 12-step groups, and problem-specific support groups to expand supports, as well as doing something different than has been done before. Pt shared that her son and his family are her supports but has to work on expanding her support system, as her son is often on the road as a Administrator.  Pt actively participated in group discussion.    Theressa Millard, [place] 03/09/2016 3:53 PM

## 2016-03-10 LAB — GLUCOSE, CAPILLARY
GLUCOSE-CAPILLARY: 123 mg/dL — AB (ref 65–99)
Glucose-Capillary: 86 mg/dL (ref 65–99)
Glucose-Capillary: 148 mg/dL — ABNORMAL HIGH (ref 65–99)
Glucose-Capillary: 98 mg/dL (ref 65–99)

## 2016-03-10 MED ORDER — LOPERAMIDE HCL 2 MG PO CAPS: 2.0000 mg | ORAL_CAPSULE | ORAL | Status: DC | PRN

## 2016-03-10 NOTE — BHH Group Notes (Signed)
[place] LCSW Group Therapy  03/10/2016 , 4:13 PM   Type of Therapy:  Group Therapy  Participation Level:  Active  Participation Quality:  Attentive  Affect:  Appropriate  Cognitive:  Alert  Insight:  Improving  Engagement in Therapy:  Engaged  Modes of Intervention:  Discussion, Exploration and Socialization  Summary of Progress/Problems: Today's group focused on the term Diagnosis.  Participants were asked to define the term, and then pronounce whether it is a negative, positive or neutral term.  Stayed the entire time, engaged throughout.  Limited insight.  "I have a hard time staying awake, but I'm doing my best" was her main contribution.  Also cited lack of informal supports as a major stumbling block.  "I'm glad I have the ACT team, but it's not the same as having friends and family who are there for you."  [name] 03/10/2016 , 4:13 PM

## 2016-03-10 NOTE — Progress Notes (Signed)
[facility] MD Progress Note  03/10/2016 12:59 PM Maureen Swanson  MRN:  [id number] Subjective:  Patient reports " I am having on and off thoughts to hurt myself." -patient has complaints of reoccurring diarrhea.  Objective: Ysidro Evert is awake, alert and oriented X4. Seen resting in bedroom. Denies suicidal or homicidal ideation. Denies auditory or visual hallucination and does not appear to be responding to internal stimuli.  Patient reports she is medication compliant without mediation side effects.  States her depression 5/10. Patient states "I am not feeling myself today having bouts of diarrhea again"  Reports good appetite and states she is resting well. Support, encouragement and reassurance was provided.   Principal Problem: Schizoaffective disorder, bipolar type ([place]) Diagnosis:   Patient Active Problem List   Diagnosis Date Noted  . Hypomagnesemia [E83.42]   . Cannabis use disorder, moderate, dependence ([place]) [F12.20] 11/20/2015  . Homicidal ideation [R45.850]   . Tobacco use disorder [F17.200] 07/20/2015  . Diabetes mellitus ([place]) [E11.9] 07/20/2015  . Cocaine use disorder, moderate, dependence ([place]) [F14.20] 04/03/2015  . Schizoaffective disorder, bipolar type ([place]) [F25.0] 04/03/2015  . Alcohol use disorder, moderate, dependence ([place]) [F10.20] 04/03/2015  . Acute ischemic stroke ([place]) [I63.9] 12/08/2014  . Left-sided weakness [M62.89] 12/08/2014  . 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 Knee pain [M25.569] 10/17/2010  . MAMMOGRAM, ABNORMAL, RIGHT [R92.8] 08/05/2010  . Dyslipidemia [E78.5] 06/21/2010  . AMENORRHEA, SECONDARY [N91.2] 10/29/2009  . 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 GERD [K21.9] 10/11/2007  . Essential hypertension [I10] 02/26/2007   Total Time spent with patient: 15 minutes  Past Psychiatric History: as per H&P.  Past Medical History:  Past Medical History:  Diagnosis Date  . Anxiety   . Arthritis    back and knees  .  Asthma    daily and prn inhalers  . 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 Bipolar 1 disorder ([place])   . CHF (congestive heart failure) ([place])   . Depression   . Diabetes mellitus    diet-controlled  . Gastric ulcer   . GERD (gastroesophageal reflux disease)   . Gout   . Headache(784.0)    migraines  . High cholesterol   . Hypertension    under control, has been on med. x 12 yrs.  . Substance abuse    crack cocaine  . TMJ (temporomandibular joint disorder)     Past Surgical History:  Procedure Laterality Date  . BREAST LUMPECTOMY WITH NEEDLE LOCALIZATION  04/19/2012   Procedure: BREAST LUMPECTOMY WITH NEEDLE LOCALIZATION;  Surgeon: [name], MD;  Location: [facility];  Service: General;  Laterality: Right;  . KNEE ARTHROSCOPY W/ PARTIAL MEDIAL MENISCECTOMY  05/01/2010   right   Family History:  Family History  Problem Relation Age of Onset  . Diabetes Mother   . Breast cancer Mother   . Cancer Father   . Bipolar disorder Maternal Aunt   . Schizophrenia Maternal Grandfather   . Alcoholism Maternal Uncle    Family Psychiatric  History: See Above Social History: As per H&P. History  Alcohol Use  . 0.0 oz/week    Comment: reports she drinks 3 40 ounce beers daily     History  Drug Use  . Frequency: 5.0 times per week  . Types: Cocaine, Benzodiazepines    Comment: 02/28/16    Social History   Social History  . Marital status: Single    Spouse  name: N/A  . Number of children: N/A  . Years of education: N/A   Social History Main Topics  . Smoking status: Current Every Day Smoker    Packs/day: 1.00    Years: 0.00    Types: Cigarettes  . Smokeless tobacco: Never Used  . Alcohol use 0.0 oz/week     Comment: reports she drinks 3 40 ounce beers daily  . Drug use:     Frequency: 5.0 times per week    Types: Cocaine, Benzodiazepines     Comment: 02/28/16  . Sexual activity: No   Other Topics Concern  . None   Social  History Narrative  . None   Additional Social History:    Pain Medications: See PTA Prescriptions: See PTA Over the Counter: See PTA History of alcohol / drug use?: Yes Longest period of sobriety (when/how long): 2 years Negative Consequences of Use: Financial, Legal Withdrawal Symptoms: Agitation Name of Substance 1: Cocaine 1 - Age of First Use: 22 1 - Amount (size/oz): $200 worth 1 - Frequency: Monthly/weekly 1 - Duration: Ongoing 1 - Last Use / Amount: 02/28/16                  Sleep: Fair   Appetite:  Fair  Current Medications: Current Facility-Administered Medications  Medication Dose Route Frequency Provider Last Rate Last Dose  . acetaminophen (TYLENOL) tablet 650 mg  650 mg Oral Q6H PRN Shuvon B Rankin, NP   650 mg at 03/05/16 0843  . alum & mag hydroxide-simeth (MAALOX/MYLANTA) 200-200-20 MG/5ML suspension 30 mL  30 mL Oral Q4H PRN Shuvon B Rankin, NP   30 mL at 03/03/16 0922  . amLODipine (NORVASC) tablet 5 mg  5 mg Oral Daily Shuvon B Rankin, NP   5 mg at 03/10/16 0816  . [START ON 03/19/2016] ARIPiprazole ER SUSR 400 mg  400 mg Intramuscular Q28 days [name], MD      . atenolol (TENORMIN) tablet 100 mg  100 mg Oral Daily Shuvon B Rankin, NP   100 mg at 03/10/16 0815  . atorvastatin (LIPITOR) tablet 40 mg  40 mg Oral q1800 Shuvon B Rankin, NP   40 mg at 03/09/16 1725  . benztropine (COGENTIN) tablet 0.5 mg  0.5 mg Oral QHS Shuvon B Rankin, NP   0.5 mg at 03/09/16 2120  . divalproex (DEPAKOTE) DR tablet 500 mg  500 mg Oral Q12H Shuvon B Rankin, NP   500 mg at 03/10/16 0815  . gabapentin (NEURONTIN) capsule 400 mg  400 mg Oral TID Shuvon B Rankin, NP   400 mg at 03/10/16 1145  . hydrOXYzine (ATARAX/VISTARIL) tablet 25 mg  25 mg Oral Q6H PRN [name], MD   25 mg at 03/06/16 1443  . insulin aspart (novoLOG) injection 0-15 Units  0-15 Units Subcutaneous TID WC [name], MD   2 Units at 03/10/16 1210  . insulin aspart (novoLOG) injection 0-5 Units   0-5 Units Subcutaneous QHS Saramma Eappen, MD      . insulin glargine (LANTUS) injection 24 Units  24 Units Subcutaneous QHS [name], PA-C   24 Units at 03/09/16 2128  . linagliptin (TRADJENTA) tablet 5 mg  5 mg Oral Daily Shuvon B Rankin, NP   5 mg at 03/10/16 0816  . magnesium hydroxide (MILK OF MAGNESIA) suspension 30 mL  30 mL Oral Daily PRN Shuvon B Rankin, NP      . metFORMIN (GLUCOPHAGE) tablet 1,000 mg  1,000 mg Oral BID WC Shuvon  B Rankin, NP   1,000 mg at 03/10/16 0814  . multivitamin with minerals tablet 1 tablet  1 tablet Oral Daily [name], MD   1 tablet at 03/10/16 0814  . OLANZapine (ZYPREXA) tablet 5 mg  5 mg Oral TID PRN [name], MD   5 mg at 03/05/16 1535   Or  . OLANZapine (ZYPREXA) injection 5 mg  5 mg Intramuscular TID PRN [name], MD      . perphenazine (TRILAFON) tablet 4 mg  4 mg Oral BH-qamhs Saramma Eappen, MD   4 mg at 03/10/16 0816  . thiamine (VITAMIN B-1) tablet 100 mg  100 mg Oral Daily [name], MD   100 mg at 03/10/16 0814  . traZODone (DESYREL) tablet 175 mg  175 mg Oral QHS [name], MD   175 mg at 03/09/16 2119    Lab Results:  Results for orders placed or performed during the hospital encounter of 03/02/16 (from the past 48 hour(s))  Glucose, capillary     Status: Abnormal   Collection Time: 03/08/16  4:55 PM  Result Value Ref Range   Glucose-Capillary 143 (H) 65 - 99 mg/dL   Comment 1 Notify RN    Comment 2 Document in Chart   Glucose, capillary     Status: Abnormal   Collection Time: 03/08/16  8:26 PM  Result Value Ref Range   Glucose-Capillary 106 (H) 65 - 99 mg/dL  Glucose, capillary     Status: None   Collection Time: 03/09/16  6:14 AM  Result Value Ref Range   Glucose-Capillary 95 65 - 99 mg/dL  Glucose, capillary     Status: Abnormal   Collection Time: 03/09/16 11:36 AM  Result Value Ref Range   Glucose-Capillary 153 (H) 65 - 99 mg/dL  Glucose, capillary     Status: Abnormal   Collection Time:  03/09/16  5:10 PM  Result Value Ref Range   Glucose-Capillary 147 (H) 65 - 99 mg/dL   Comment 1 Notify RN    Comment 2 Document in Chart   Glucose, capillary     Status: Abnormal   Collection Time: 03/09/16  8:46 PM  Result Value Ref Range   Glucose-Capillary 161 (H) 65 - 99 mg/dL   Comment 1 Notify RN   Glucose, capillary     Status: None   Collection Time: 03/10/16  6:09 AM  Result Value Ref Range   Glucose-Capillary 86 65 - 99 mg/dL  Glucose, capillary     Status: Abnormal   Collection Time: 03/10/16 11:53 AM  Result Value Ref Range   Glucose-Capillary 123 (H) 65 - 99 mg/dL    Blood Alcohol level:  Lab Results  Component Value Date   ETH <5 02/29/2016   ETH 6 (H) 02/13/2016    Physical Findings: AIMS: Facial and Oral Movements Muscles of Facial Expression: None, normal Lips and Perioral Area: None, normal Jaw: None, normal Tongue: None, normal,Extremity Movements Upper (arms, wrists, hands, fingers): None, normal Lower (legs, knees, ankles, toes): None, normal, Trunk Movements Neck, shoulders, hips: None, normal, Overall Severity Severity of abnormal movements (highest score from questions above): None, normal Incapacitation due to abnormal movements: None, normal Patient's awareness of abnormal movements (rate only patient's report): No Awareness, Dental Status Current problems with teeth and/or dentures?: No Does patient usually wear dentures?: No  CIWA:  CIWA-Ar Total: 2 COWS:     Musculoskeletal: Strength & Muscle Tone: within normal limits Gait & Station: normal Patient leans: N/A  Psychiatric Specialty  Exam: Review of Systems  Gastrointestinal: Positive for diarrhea.  Psychiatric/Behavioral: Positive for depression and substance abuse. The patient is nervous/anxious.   All other systems reviewed and are negative.   Blood pressure (!) 138/92, pulse 82, temperature 99.1 F (37.3 C), temperature source Oral, resp. rate 18, height 5\' 4"  (1.626 m), weight  96.6 kg (213 lb).Body mass index is 36.56 kg/m.  General Appearance: Casual  Eye Contact::  Fair  Speech:  Clear and Coherent  Volume:  Normal  Mood:  Anxious, Depressed and Dysphoric improving  Affect:  Depressed   Thought Process:  Goal Directed and Descriptions of Associations: Circumstantial  Orientation:  Full (Time, Place, and Person)  Thought Content:  Hallucinations: Auditory on and off - command asking her to hurt other people- improving  Suicidal Thoughts:  No  Patient is able to contract for safety  Homicidal Thoughts:  No   Memory:  Immediate;   Fair Recent;   Fair Remote;   Fair  Judgement:  Fair  Insight:  Shallow  Psychomotor Activity:  Restlessness  Concentration:  Fair  Recall:  [employer] of Knowledge:Fair  Language: Fair  Akathisia:  No  Handed:  Right  AIMS (if indicated):     Assets:  Communication Skills Desire for Improvement Resilience  ADL's:  Intact  Cognition: WNL  Sleep:  Number of Hours: 6     I agree with current treatment plan on 03/10/2016, Patient seen face-to-face for psychiatric evaluation follow-up, chart reviewed. Reviewed the information documented and agree with the treatment plan.   Treatment Plan Summary: Daily contact with patient to assess and evaluate symptoms and progress in treatment and Medication management  Start imodium 4mg  PO PRN for diarrhea Will continue CIWA/Librium protocol for alcohol withdrawal sx. Will continue Abilify Maintena IM 400 mg - last dose on 02/20/16- repeat q28 days. Will continue Gabapentin 400 mg po tid for mood lability/anxiety/pain. Will continue Trazodone 175  mg po qhs for sleep. Contiune Trilafon to 4 mg po bid for augmenting the effect of Abilify Maintena IM. Will continue Depakote DR 500 mg po bid for mood lability. Depakote level on 03/07/16 -76 Restarted home medications as needed. Will continue to monitor vitals ,medication compliance and treatment side effects while patient is here.  Will  monitor for medical issues as well as call consult as needed.  Reviewed labs cbc - hb - low , cmp- creatinine slightly elevated, TSH - wnl ( 08/10/15) , lipid panel - wnl ( 08/10/15) , hba1c-5.1 ( 11/20/15) ,uds - pos for BZD , cocaine , BAL - <5 , EKG for qtc monitoring-02/14/16- WNL .  CSW will continue working on disposition. Patient to be referred to a substance abuse treatment program/[facility]. Patient to participate in therapeutic milieu .   Derrill Center, NP 03/10/2016, 12:59 PM Agree with NP progress note as above  Neita Garnet, MD

## 2016-03-10 NOTE — Progress Notes (Signed)
DAR NOTE: Pt present with flat affect and depressed mood in the unit. Pt  Has been seating in the dayroom most of the time. Pt denies physical pain, took all her meds as scheduled. As per self inventory, pt had a fair night sleep, fair appetite, low energy, and poor concentration. Pt rate depression at 8, hopeless ness at 8, and anxiety at 8.Pt states goal for today is " finding somewhere to go."  Pt's safety ensured with 15 minute and environmental checks. Pt currently denies SI/HI and A/V hallucinations. Pt verbally agrees to seek staff if SI/HI or A/VH occurs and to consult with staff before acting on these thoughts. Will continue POC.

## 2016-03-10 NOTE — Plan of Care (Signed)
Problem: Coping: Goal: Ability to cope will improve Outcome: Progressing Pt reports goal is to "stay of of bed". Pt has been in dayroom most of the evening

## 2016-03-11 LAB — GLUCOSE, CAPILLARY
GLUCOSE-CAPILLARY: 153 mg/dL — AB (ref 65–99)
GLUCOSE-CAPILLARY: 115 mg/dL — AB (ref 65–99)
Glucose-Capillary: 92 mg/dL (ref 65–99)
Glucose-Capillary: 155 mg/dL — ABNORMAL HIGH (ref 65–99)

## 2016-03-11 NOTE — Progress Notes (Signed)
Adult Psychoeducational Group Note  Date:  03/11/2016 Time:  7:06 PM  Group Topic/Focus:  Recovery Goals:   The focus of this group is to identify appropriate goals for recovery and establish a plan to achieve them.   Participation Level:  Active  Participation Quality:  Appropriate and Attentive  Affect:  Appropriate  Cognitive:  Alert and Appropriate  Insight: Appropriate and Good  Engagement in Group:  Engaged  Modes of Intervention:  Discussion and Education   Mart Piggs 03/11/2016, 7:06 PM

## 2016-03-11 NOTE — Progress Notes (Signed)
[facility] MD Progress Note  03/11/2016 12:50 PM Maureen Swanson  MRN:  [id number] Subjective:  Patient reports " I am ok .'     Objective: Maureen Swanson  is a 50 y.o. AA female, who is divorced , has a hx of schizoaffective do as well as cocaine abuse, alcohol abuse , stimulant abuse , who presented [facility] ED with worsening depression and SI with plan to cut self. Maureen Swanson is well known to the [facility] .  Maureen Swanson today denies any new concerns, is able to attend groups and participate in milieu. Per RN - pt seen as less depressed and less anxious , tolerating medications well.     Principal Problem: Schizoaffective disorder, bipolar type ([place]) Diagnosis:   Patient Active Problem List   Diagnosis Date Noted  . Hypomagnesemia [E83.42]   . Cannabis use disorder, moderate, dependence (Maureen Swanson) [F12.20] 11/20/2015  . Homicidal ideation [R45.850]   . Tobacco use disorder [F17.200] 07/20/2015  . Diabetes mellitus ([place]) [E11.9] 07/20/2015  . Cocaine use disorder, moderate, dependence ([place]) [F14.20] 04/03/2015  . Schizoaffective disorder, bipolar type (Maureen Swanson) [F25.0] 04/03/2015  . Alcohol use disorder, moderate, dependence (Maureen Swanson) [F10.20] 04/03/2015  . Acute ischemic stroke (Maureen Swanson) [I63.9] 12/08/2014  . Left-sided weakness [M62.89] 12/08/2014  . 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 Knee pain [M25.569] 10/17/2010  . MAMMOGRAM, ABNORMAL, RIGHT [R92.8] 08/05/2010  . Dyslipidemia [E78.5] 06/21/2010  . AMENORRHEA, SECONDARY [N91.2] 10/29/2009  . 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 GERD [K21.9] 10/11/2007  . Essential hypertension [I10] 02/26/2007   Total Time spent with patient: 15 minutes  Past Psychiatric History: as per H&P.  Past Medical History:  Past Medical History:  Diagnosis Date  . Anxiety   . Arthritis    back and knees  . Asthma    daily and prn inhalers  . 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 Bipolar 1 disorder (Maureen Swanson)   . CHF (congestive heart failure)  (Maureen Swanson)   . Depression   . Diabetes mellitus    diet-controlled  . Gastric ulcer   . GERD (gastroesophageal reflux disease)   . Gout   . Headache(784.0)    migraines  . High cholesterol   . Hypertension    under control, has been on med. x 12 yrs.  . Substance abuse    crack cocaine  . TMJ (temporomandibular joint disorder)     Past Surgical History:  Procedure Laterality Date  . BREAST LUMPECTOMY WITH NEEDLE LOCALIZATION  04/19/2012   Procedure: BREAST LUMPECTOMY WITH NEEDLE LOCALIZATION;  Surgeon: Maureen Roof, MD;  Location: [place];  Service: General;  Laterality: Right;  . KNEE ARTHROSCOPY W/ PARTIAL MEDIAL MENISCECTOMY  05/01/2010   right   Family History:  Family History  Problem Relation Age of Onset  . Diabetes Mother   . Breast cancer Mother   . Cancer Father   . Bipolar disorder Maternal Aunt   . Schizophrenia Maternal Grandfather   . Alcoholism Maternal Uncle    Family Psychiatric  History: See Above Social History: As per H&P. History  Alcohol Use  . 0.0 oz/week    Comment: reports she drinks 3 40 ounce beers daily     History  Drug Use  . Frequency: 5.0 times per week  . Types: Cocaine, Benzodiazepines    Comment: 02/28/16    Social History   Social History  . Marital status: Single    Spouse name: N/A  .  Number of children: N/A  . Years of education: N/A   Social History Main Topics  . Smoking status: Current Every Day Smoker    Packs/day: 1.00    Years: 0.00    Types: Cigarettes  . Smokeless tobacco: Never Used  . Alcohol use 0.0 oz/week     Comment: reports she drinks 3 40 ounce beers daily  . Drug use:     Frequency: 5.0 times per week    Types: Cocaine, Benzodiazepines     Comment: 02/28/16  . Sexual activity: No   Other Topics Concern  . None   Social History Narrative  . None   Additional Social History:    Pain Medications: See PTA Prescriptions: See PTA Over the Counter: See PTA History of alcohol /  drug use?: Yes Longest period of sobriety (when/how long): 2 years Negative Consequences of Use: Financial, Legal Withdrawal Symptoms: Agitation Name of Substance 1: Cocaine 1 - Age of First Use: 22 1 - Amount (size/oz): $200 worth 1 - Frequency: Monthly/weekly 1 - Duration: Ongoing 1 - Last Use / Amount: 02/28/16                  Sleep: Fair   Appetite:  Fair  Current Medications: Current Facility-Administered Medications  Medication Dose Route Frequency Provider Last Rate Last Dose  . acetaminophen (TYLENOL) tablet 650 mg  650 mg Oral Q6H PRN Maureen B Rankin, NP   650 mg at 03/05/16 0843  . alum & mag hydroxide-simeth (MAALOX/MYLANTA) 200-200-20 MG/5ML suspension 30 mL  30 mL Oral Q4H PRN Maureen B Rankin, NP   30 mL at 03/03/16 0922  . amLODipine (NORVASC) tablet 5 mg  5 mg Oral Daily Maureen B Rankin, NP   5 mg at 03/11/16 0815  . [START ON 03/19/2016] ARIPiprazole ER SUSR 400 mg  400 mg Intramuscular Q28 days Maureen Alert, MD      . atenolol (TENORMIN) tablet 100 mg  100 mg Oral Daily Maureen B Rankin, NP   100 mg at 03/11/16 0815  . atorvastatin (LIPITOR) tablet 40 mg  40 mg Oral q1800 Maureen B Rankin, NP   40 mg at 03/10/16 1708  . benztropine (COGENTIN) tablet 0.5 mg  0.5 mg Oral QHS Maureen B Rankin, NP   0.5 mg at 03/10/16 2114  . divalproex (DEPAKOTE) DR tablet 500 mg  500 mg Oral Q12H Maureen B Rankin, NP   500 mg at 03/11/16 0815  . gabapentin (NEURONTIN) capsule 400 mg  400 mg Oral TID Maureen B Rankin, NP   400 mg at 03/11/16 1158  . hydrOXYzine (ATARAX/VISTARIL) tablet 25 mg  25 mg Oral Q6H PRN Maureen Alert, MD   25 mg at 03/06/16 1443  . insulin aspart (novoLOG) injection 0-15 Units  0-15 Units Subcutaneous TID WC Maureen Alert, MD   3 Units at 03/11/16 1155  . insulin aspart (novoLOG) injection 0-5 Units  0-5 Units Subcutaneous QHS Maureen Shingledecker, MD      . insulin glargine (LANTUS) injection 24 Units  24 Units Subcutaneous QHS Maureen Hobby, PA-C   24 Units at  03/10/16 2128  . linagliptin (TRADJENTA) tablet 5 mg  5 mg Oral Daily Maureen B Rankin, NP   5 mg at 03/11/16 0815  . loperamide (IMODIUM) capsule 2 mg  2 mg Oral PRN Maureen Center, NP      . magnesium hydroxide (MILK OF MAGNESIA) suspension 30 mL  30 mL Oral Daily PRN Maureen B Rankin, NP      .  metFORMIN (GLUCOPHAGE) tablet 1,000 mg  1,000 mg Oral BID WC Maureen B Rankin, NP   1,000 mg at 03/11/16 0815  . multivitamin with minerals tablet 1 tablet  1 tablet Oral Daily Maureen Alert, MD   1 tablet at 03/11/16 0815  . OLANZapine (ZYPREXA) tablet 5 mg  5 mg Oral TID PRN Maureen Alert, MD   5 mg at 03/05/16 1535   Or  . OLANZapine (ZYPREXA) injection 5 mg  5 mg Intramuscular TID PRN Maureen Alert, MD      . perphenazine (TRILAFON) tablet 4 mg  4 mg Oral BH-qamhs Maureen Parmer, MD   4 mg at 03/11/16 0815  . thiamine (VITAMIN B-1) tablet 100 mg  100 mg Oral Daily Maureen Alert, MD   100 mg at 03/11/16 0815  . traZODone (DESYREL) tablet 175 mg  175 mg Oral QHS Maureen Alert, MD   175 mg at 03/10/16 2126    Lab Results:  Results for orders placed or performed during the hospital encounter of 03/02/16 (from the past 48 hour(s))  Glucose, capillary     Status: Abnormal   Collection Time: 03/09/16  5:10 PM  Result Value Ref Range   Glucose-Capillary 147 (H) 65 - 99 mg/dL   Comment 1 Notify RN    Comment 2 Document in Chart   Glucose, capillary     Status: Abnormal   Collection Time: 03/09/16  8:46 PM  Result Value Ref Range   Glucose-Capillary 161 (H) 65 - 99 mg/dL   Comment 1 Notify RN   Glucose, capillary     Status: None   Collection Time: 03/10/16  6:09 AM  Result Value Ref Range   Glucose-Capillary 86 65 - 99 mg/dL  Glucose, capillary     Status: Abnormal   Collection Time: 03/10/16 11:53 AM  Result Value Ref Range   Glucose-Capillary 123 (H) 65 - 99 mg/dL  Glucose, capillary     Status: Abnormal   Collection Time: 03/10/16  4:42 PM  Result Value Ref Range   Glucose-Capillary  148 (H) 65 - 99 mg/dL  Glucose, capillary     Status: None   Collection Time: 03/10/16  8:55 PM  Result Value Ref Range   Glucose-Capillary 98 65 - 99 mg/dL  Glucose, capillary     Status: None   Collection Time: 03/11/16  6:17 AM  Result Value Ref Range   Glucose-Capillary 92 65 - 99 mg/dL  Glucose, capillary     Status: Abnormal   Collection Time: 03/11/16 11:53 AM  Result Value Ref Range   Glucose-Capillary 155 (H) 65 - 99 mg/dL    Blood Alcohol level:  Lab Results  Component Value Date   ETH <5 02/29/2016   ETH 6 (H) 02/13/2016    Physical Findings: AIMS: Facial and Oral Movements Muscles of Facial Expression: None, normal Lips and Perioral Area: None, normal Jaw: None, normal Tongue: None, normal,Extremity Movements Upper (arms, wrists, hands, fingers): None, normal Lower (legs, knees, ankles, toes): None, normal, Trunk Movements Neck, shoulders, hips: None, normal, Overall Severity Severity of abnormal movements (highest score from questions above): None, normal Incapacitation due to abnormal movements: None, normal Patient's awareness of abnormal movements (rate only patient's report): No Awareness, Dental Status Current problems with teeth and/or dentures?: No Does patient usually wear dentures?: No  CIWA:  CIWA-Ar Total: 2 COWS:     Musculoskeletal: Strength & Muscle Tone: within normal limits Gait & Station: normal Patient leans: N/A  Psychiatric Specialty Exam: Review of Systems  Psychiatric/Behavioral: Positive for depression and substance abuse. The patient is nervous/anxious.   All other systems reviewed and are negative.   Blood pressure (!) 143/102, pulse 79, temperature 98.7 F (37.1 C), temperature source Oral, resp. rate 18, height 5\' 4"  (1.626 m), weight 96.6 kg (213 lb).Body mass index is 36.56 kg/m.  General Appearance: Casual  Eye Contact::  Fair  Speech:  Clear and Coherent  Volume:  Normal  Mood:  Anxious and Depressed improving   Affect:  Depressed   Thought Process:  Goal Directed and Descriptions of Associations: Circumstantial  Orientation:  Full (Time, Place, and Person)  Thought Content:  Hallucinations: Auditory on and off - command asking her to hurt other people- improving  Suicidal Thoughts:  No    Homicidal Thoughts:  No   Memory:  Immediate;   Fair Recent;   Fair Remote;   Fair  Judgement:  Fair  Insight:  Shallow  Psychomotor Activity:  Restlessness  Concentration:  Fair  Recall:  [employer] of Knowledge:Fair  Language: Fair  Akathisia:  No  Handed:  Right  AIMS (if indicated):     Assets:  Communication Skills Desire for Improvement Resilience  ADL's:  Intact  Cognition: WNL  Sleep:  Number of Hours: 6     Treatment Plan Summary:Lucina Jerilynn Mages Holleman is a  50 y.o.AA female , who has a hx of schizoaffective do , cocaine, alcohol, cannabis abuse, who voluntarily presents to [facility] s/p SI with plan to cut. Pt seen as improving , is less depressed , less anxious  ,her AH is improving , she denies SI .  Will continue treatment.  Daily contact with patient to assess and evaluate symptoms and progress in treatment and Medication management   Will continue Abilify Maintena IM 400 mg - last dose on 02/20/16- repeat q28 days. Will continue Gabapentin 400 mg po tid for mood lability/anxiety/pain. Will continue Trazodone 175  mg po qhs for sleep. Increased Trilafon to 4 mg po bid for augmenting the effect of Abilify Maintena IM. Will continue Depakote DR 500 mg po bid for mood lability. Depakote level on 03/07/16 -76 Restarted home medications as needed. Will continue to monitor vitals ,medication compliance and treatment side effects while patient is here.  Will monitor for medical issues as well as call consult as needed.  Reviewed labs cbc - hb - low , cmp- creatinine slightly elevated, TSH - wnl ( 08/10/15) , lipid panel - wnl ( 08/10/15) , hba1c-5.1 ( 11/20/15) ,uds - pos for BZD , cocaine , BAL - <5 , EKG for  qtc monitoring-02/14/16- WNL .  CSW will continue working on disposition. Patient to be referred to a substance abuse treatment program/ACTT/PATH. Patient to participate in therapeutic milieu .  Lacresha Fusilier, MD 03/11/2016, 12:50 PM

## 2016-03-11 NOTE — Progress Notes (Signed)
Patient ID: Maureen Swanson, female   DOB: [birth year], 50 y.o.   MRN: [id number] D: Patient sitting in dayroom watching TV and interacting with peers on approach. Pt reports goal was to stay out of bed. Pt reports tolerating medication well. Mood and affect appeared depressed and flat. Pt forwards little and interaction with staff is minimal. Denies SI/HI/AVH and pain. A: Support and encouragement offered as needed. Medications administered as prescribed.  R: Patient cooperative and appropriate on unit. Will continue to monitor patient for safety and stability.

## 2016-03-11 NOTE — Tx Team (Signed)
Interdisciplinary Treatment and Diagnostic Plan Update  03/11/2016 Time of Session: 8:45 AM Maureen Swanson MRN: [id number]  Principal Diagnosis: Schizoaffective disorder, bipolar type ([place])  Secondary Diagnoses: Principal Problem:   Schizoaffective disorder, bipolar type ([place]) Active Problems:   Essential hypertension   Cocaine use disorder, moderate, dependence (H[facility])   Alcohol use disorder, moderate, dependence (H[facility])   Tobacco use disorder   Diabetes mellitus ([place])   Cannabis use disorder, moderate, dependence ([place])   Current Medications:  Current Facility-Administered Medications  Medication Dose Route Frequency Provider Last Rate Last Dose  . acetaminophen (TYLENOL) tablet 650 mg  650 mg Oral Q6H PRN Shuvon B Rankin, NP   650 mg at 03/05/16 0843  . alum & mag hydroxide-simeth (MAALOX/MYLANTA) 200-200-20 MG/5ML suspension 30 mL  30 mL Oral Q4H PRN Shuvon B Rankin, NP   30 mL at 03/03/16 0922  . amLODipine (NORVASC) tablet 5 mg  5 mg Oral Daily Shuvon B Rankin, NP   5 mg at 03/11/16 0815  . [START ON 03/19/2016] ARIPiprazole ER SUSR 400 mg  400 mg Intramuscular Q28 days [name], MD      . atenolol (TENORMIN) tablet 100 mg  100 mg Oral Daily Shuvon B Rankin, NP   100 mg at 03/11/16 0815  . atorvastatin (LIPITOR) tablet 40 mg  40 mg Oral q1800 Shuvon B Rankin, NP   40 mg at 03/10/16 1708  . benztropine (COGENTIN) tablet 0.5 mg  0.5 mg Oral QHS Shuvon B Rankin, NP   0.5 mg at 03/10/16 2114  . divalproex (DEPAKOTE) DR tablet 500 mg  500 mg Oral Q12H Shuvon B Rankin, NP   500 mg at 03/11/16 0815  . gabapentin (NEURONTIN) capsule 400 mg  400 mg Oral TID Shuvon B Rankin, NP   400 mg at 03/11/16 0815  . hydrOXYzine (ATARAX/VISTARIL) tablet 25 mg  25 mg Oral Q6H PRN [name], MD   25 mg at 03/06/16 1443  . insulin aspart (novoLOG) injection 0-15 Units  0-15 Units Subcutaneous TID WC [name], MD   2 Units at 03/10/16 1707  . insulin aspart (novoLOG) injection 0-5 Units  0-5  Units Subcutaneous QHS Saramma Eappen, MD      . insulin glargine (LANTUS) injection 24 Units  24 Units Subcutaneous QHS [name], PA-C   24 Units at 03/10/16 2128  . linagliptin (TRADJENTA) tablet 5 mg  5 mg Oral Daily Shuvon B Rankin, NP   5 mg at 03/11/16 0815  . loperamide (IMODIUM) capsule 2 mg  2 mg Oral PRN Derrill Center, NP      . magnesium hydroxide (MILK OF MAGNESIA) suspension 30 mL  30 mL Oral Daily PRN Shuvon B Rankin, NP      . metFORMIN (GLUCOPHAGE) tablet 1,000 mg  1,000 mg Oral BID WC Shuvon B Rankin, NP   1,000 mg at 03/11/16 0815  . multivitamin with minerals tablet 1 tablet  1 tablet Oral Daily [name], MD   1 tablet at 03/11/16 0815  . OLANZapine (ZYPREXA) tablet 5 mg  5 mg Oral TID PRN [name], MD   5 mg at 03/05/16 1535   Or  . OLANZapine (ZYPREXA) injection 5 mg  5 mg Intramuscular TID PRN [name], MD      . perphenazine (TRILAFON) tablet 4 mg  4 mg Oral BH-qamhs Saramma Eappen, MD   4 mg at 03/11/16 0815  . thiamine (VITAMIN B-1) tablet 100 mg  100 mg Oral Daily Saramma Eappen,  MD   100 mg at 03/11/16 0815  . traZODone (DESYREL) tablet 175 mg  175 mg Oral QHS [name], MD   175 mg at 03/10/16 2126   PTA Medications: Prescriptions Prior to Admission  Medication Sig Dispense Refill Last Dose  . amLODipine (NORVASC) 5 MG tablet Take 1 tablet (5 mg total) by mouth daily. 30 tablet 0 02/28/2016 at Unknown time  . [START ON 03/19/2016] ARIPiprazole ER 400 MG SUSR Inject 400 mg into the muscle every 28 (twenty-eight) days. Due next Oct 4.  Last dose was given Sept 6. 1 each 0 a week  . atenolol (TENORMIN) 100 MG tablet Take 1 tablet (100 mg total) by mouth daily. 30 tablet 0 02/28/2016 at 0900  . atorvastatin (LIPITOR) 40 MG tablet Take 1 tablet (40 mg total) by mouth daily at 6 PM. 30 tablet 0 02/28/2016 at Unknown time  . benztropine (COGENTIN) 0.5 MG tablet Take 1 tablet (0.5 mg total) by mouth at bedtime. 30 tablet 0 02/28/2016 at Unknown time   . divalproex (DEPAKOTE) 250 MG DR tablet Take 1 tablet (250 mg total) by mouth every 8 (eight) hours. 90 tablet 0 02/28/2016 at Unknown time  . gabapentin (NEURONTIN) 400 MG capsule Take 1 capsule (400 mg total) by mouth 3 (three) times daily. 90 capsule 0 02/28/2016 at Unknown time  . hydrOXYzine (ATARAX/VISTARIL) 25 MG tablet Take 1 tablet (25 mg total) by mouth 3 (three) times daily as needed for anxiety. 30 tablet 0 Past Week at Unknown time  . insulin glargine (LANTUS) 100 UNIT/ML injection Inject 30 Units into the skin at bedtime.   02/28/2016 at Unknown time  . linagliptin (TRADJENTA) 5 MG TABS tablet Take 1 tablet (5 mg total) by mouth daily. 30 tablet 0 02/28/2016 at Unknown time  . metFORMIN (GLUCOPHAGE) 1000 MG tablet Take 1 tablet (1,000 mg total) by mouth 2 (two) times daily with a meal. 60 tablet 0 02/28/2016 at Unknown time  . Multiple Vitamin (MULTIVITAMIN WITH MINERALS) TABS tablet Take 1 tablet by mouth daily.   02/28/2016 at Unknown time  . perphenazine (TRILAFON) 2 MG tablet Take 1 tablet (2 mg total) by mouth 2 (two) times daily in the am and at bedtime.. 60 tablet 0 02/28/2016 at Unknown time  . traZODone (DESYREL) 150 MG tablet Take 1 tablet (150 mg total) by mouth at bedtime. 30 tablet 0 02/28/2016 at Unknown time    Treatment Modalities: Medication Management, Group therapy, Case management,  1 to 1 session with clinician, Psychoeducation, Recreational therapy.   Physician Treatment Plan for Primary Diagnosis: Schizoaffective disorder, bipolar type ([place]) Long Term Goal(s): Improvement in symptoms so as ready for discharge   Short Term Goals: Ability to identify triggers associated with substance abuse/mental health issues will improve  Medication Management: Evaluate patient's response, side effects, and tolerance of medication regimen.  Therapeutic Interventions: 1 to 1 sessions, Unit Group sessions and Medication administration.  Evaluation of Outcomes: Adequate for  Discharge  Physician Treatment Plan for Secondary Diagnosis: Principal Problem:   Schizoaffective disorder, bipolar type ([place]) Active Problems:   Essential hypertension   Cocaine use disorder, moderate, dependence ([facility])   Alcohol use disorder, moderate, dependence ([facility])   Tobacco use disorder   Diabetes mellitus ([place])   Cannabis use disorder, moderate, dependence ([place])  Long Term Goal(s): Improvement in symptoms so as ready for discharge  Short Term Goals: Ability to disclose and discuss suicidal ideas  Medication Management: Evaluate patient's response, side effects, and tolerance of medication  regimen.  Therapeutic Interventions: 1 to 1 sessions, Unit Group sessions and Medication administration.  Evaluation of Outcomes: Adequate for discharge   RN Treatment Plan for Primary Diagnosis: Schizoaffective disorder, bipolar type ([place]) Long Term Goal(s): Knowledge of disease and therapeutic regimen to maintain health will improve  Short Term Goals: Ability to disclose and discuss suicidal ideas, Ability to identify and develop effective coping behaviors will improve and Compliance with prescribed medications will improve  Medication Management: RN will administer medications as ordered by provider, will assess and evaluate patient's response and provide education to patient for prescribed medication. RN will report any adverse and/or side effects to prescribing provider.  Therapeutic Interventions: 1 on 1 counseling sessions, Psychoeducation, Medication administration, Evaluate responses to treatment, Monitor vital signs and CBGs as ordered, Perform/monitor CIWA, COWS, AIMS and Fall Risk screenings as ordered, Perform wound care treatments as ordered.  Evaluation of Outcomes: Adequate for Discharge   LCSW Treatment Plan for Primary Diagnosis: Schizoaffective disorder, bipolar type ([place]) Long Term Goal(s): Safe transition to appropriate next level of care at discharge, Engage patient  in therapeutic group addressing interpersonal concerns.  Short Term Goals: Engage patient in aftercare planning with referrals and resources, Increase social support, Increase emotional regulation and Increase skills for wellness and recovery  Therapeutic Interventions: Assess for all discharge needs, 1 to 1 time with Social worker, Explore available resources and support systems, Assess for adequacy in community support network, Educate family and significant other(s) on suicide prevention, Complete Psychosocial Assessment, Interpersonal group therapy.  Evaluation of Outcomes: Met  Pt plans to go to shelter from here, follow up with ACT team    Progress in Treatment: Attending groups: Yes. Participating in groups: Yes. Taking medication as prescribed: Yes. Toleration medication: Yes. Family/Significant other contact made: Yes Patient understands diagnosis: No. and As evidenced by:  unclear whether patient understands impact of substance use and medication non adherence on wellness Discussing patient identified problems/goals with staff: Yes. Medical problems stabilized or resolved: Yes. Denies suicidal/homicidal ideation: No. and As evidenced by:  admitted after expressing SI, coping skills inadequate for environmental stressors Issues/concerns per patient self-inventory: No. Other: none  New problem(s) identified: Yes, Describe:  patient wants help in "staying out of the hospital", coping skills  New Short Term/Long Term Goal(s): meeting w [facility] program re shelter options  Discharge Plan or Barriers: homeless, little support in community  Reason for Continuation of Hospitalization: Depression Medication stabilization Suicidal ideation  Estimated Length of Stay: 1-3 days  Attendees: Patient: 03/11/2016 9:02 AM  Physician: Charlcie Cradle MD 03/11/2016 9:02 AM  Nursing: Phillis Haggis RN 03/11/2016 9:02 AM  RN Care Manager: Maureen Naegeli RN CM 03/11/2016 9:02 AM  Social Worker: Ripley Fraise LCSW  03/11/2016 9:02 AM  Recreational Therapist: Corliss Skains LRT 03/11/2016 9:02 AM  Other:  03/11/2016 9:02 AM  Other:  03/11/2016 9:02 AM  Other: 03/11/2016 9:02 AM    Scribe for Treatment Team: [name], LCSW 03/11/2016 9:02 AM

## 2016-03-11 NOTE — [facility] Group Notes (Signed)
[facility] LCSW Group Therapy  03/11/2016 1:15 pm  Type of Therapy: Process Group Therapy  Participation Level:  Active  Participation Quality:  Appropriate  Affect:  Flat  Cognitive:  Oriented  Insight:  Improving  Engagement in Group:  Limited  Engagement in Therapy:  Limited  Modes of Intervention:  Activity, Clarification, Education, Problem-solving and Support  Summary of Progress/Problems: Today's group addressed the issue of overcoming obstacles.  Patients were asked to identify their biggest obstacle post d/c that stands in the way of their on-going success, and then problem solve as to how to manage this.  Stayed until she was called out to get blood sugar checked.  Engaged throughout, but no meaningful contribution.  [name] 03/11/2016   2:33 PM  -

## 2016-03-11 NOTE — Plan of Care (Signed)
Problem: Safety: Goal: Ability to disclose and discuss suicidal ideas will improve Outcome: Progressing Pt is safe and denies SI/HI

## 2016-03-11 NOTE — Progress Notes (Signed)
DAR NOTE: Patient presents with depressed mood and affect.  Denies auditory and visual hallucinations.  Rates depression at 8, hopelessness at 8, and anxiety at 8.  Described energy level as low and concentration as poor.  Reports suicidal thoughts on self inventory form but contracts for safety during assessment.  Maintained on routine safety checks.  Medications given as prescribed.  Support and encouragement offered as needed.  Attended group and participated.  Patient observed socializing with peers in the dayroom.  No signs and symptoms of hypoglycemic reaction noted.

## 2016-03-11 NOTE — Progress Notes (Signed)
Adult Psychoeducational Group Note  Date:  03/11/2016 Time:  8:47 PM  Group Topic/Focus:  Wrap-Up Group:   The focus of this group is to help patients review their daily goal of treatment and discuss progress on daily workbooks.   Participation Level:  Active  Participation Quality:  Appropriate  Affect:  Appropriate  Cognitive:  Appropriate  Insight: Appropriate  Engagement in Group:  Engaged  Modes of Intervention:  Discussion  Additional Comments:  The patient expressed that she had a good day and rates today a 8.The patient also said that she attended group. Nash Shearer 03/11/2016, 8:47 PM

## 2016-03-12 LAB — GLUCOSE, CAPILLARY
GLUCOSE-CAPILLARY: 89 mg/dL (ref 65–99)
GLUCOSE-CAPILLARY: 186 mg/dL — AB (ref 65–99)
GLUCOSE-CAPILLARY: 139 mg/dL — AB (ref 65–99)
Glucose-Capillary: 88 mg/dL (ref 65–99)

## 2016-03-12 MED ORDER — GABAPENTIN 400 MG PO CAPS: 400.0000 mg | ORAL_CAPSULE | Freq: Three times a day (TID) | ORAL | 0 refills | Status: DC

## 2016-03-12 MED ORDER — HYDROXYZINE HCL 25 MG PO TABS: 25.0000 mg | ORAL_TABLET | Freq: Four times a day (QID) | ORAL | 0 refills | Status: DC | PRN

## 2016-03-12 MED ORDER — INSULIN GLARGINE 100 UNIT/ML ~~LOC~~ SOLN: 24.0000 [IU] | Freq: Every day | SUBCUTANEOUS | 11 refills | Status: DC

## 2016-03-12 MED ORDER — PERPHENAZINE 4 MG PO TABS: 4.0000 mg | ORAL_TABLET | ORAL | 0 refills | Status: DC

## 2016-03-12 MED ORDER — BENZTROPINE MESYLATE 0.5 MG PO TABS: 0.5000 mg | ORAL_TABLET | Freq: Every day | ORAL | 0 refills | Status: DC

## 2016-03-12 MED ORDER — DIVALPROEX SODIUM 500 MG PO DR TAB: 500.0000 mg | DELAYED_RELEASE_TABLET | Freq: Two times a day (BID) | ORAL | 0 refills | Status: DC

## 2016-03-12 MED ORDER — TRAZODONE HCL 150 MG PO TABS
150.0000 mg | ORAL_TABLET | Freq: Every day | ORAL | Status: DC
  Filled 2016-03-12: qty 7

## 2016-03-12 MED ORDER — TRAZODONE HCL 150 MG PO TABS: 175.0000 mg | ORAL_TABLET | Freq: Every day | ORAL | 0 refills | Status: DC

## 2016-03-12 NOTE — Progress Notes (Signed)
D: Denies SI/HI/AVH at this time. Contracts for safety.  A: Encouragement and support given. Q15 minute room checks for patient safety. Medications administered as prescribed.  R: Continue to monitor for patient safety and medication effectiveness.

## 2016-03-12 NOTE — Progress Notes (Signed)
DAR NOTE: Patient presents with anxious affect and depressed mood.  Denies pain, auditory and visual hallucinations.  Rates depression at 9, hopelessness at 9, and anxiety at 9.  Maintained on routine safety checks.  Medications given as prescribed.  Support and encouragement offered as needed.  Patient observed socializing with peers in the dayroom.  Offered no complaint.

## 2016-03-12 NOTE — [facility] Group Notes (Signed)
[facility] LCSW Group Therapy  03/12/2016 2:41 PM   Type of Therapy:  Group Therapy   Participation Level:  Engaged  Participation Quality:  Attentive  Affect:  Appropriate   Cognitive:  Alert   Insight:  Engaged  Engagement in Therapy:  Improving   Modes of Intervention:  Education, Exploration, Socialization   Summary of Progress/Problems: Alitza was engaged throughout, stayed entire time.   [name] from the [facility] was here to tell his story of recovery, inform patients about [facility] and play his guitar.   [name] 03/12/2016 2:41 PM

## 2016-03-12 NOTE — Progress Notes (Signed)
[facility] MD Progress Note  03/12/2016 1:31 PM Maureen Swanson  MRN:  [id number] Subjective:  Patient reports " I did not sleep last night. I was really anxious."      Objective: Maureen Swanson  is a 50 y.o. AA female, who is divorced , has a hx of schizoaffective do as well as cocaine abuse, alcohol abuse , stimulant abuse , who presented [facility] ED with worsening depression and SI with plan to cut self. [name] is well known to the [facility] unit .  Aldyn today reports inability to sleep . [name] reports she is very anxious about the next step in her life and about the disposition plan that may be causing her to lose sleep. Sometime was spend discussing the treatment plan . Pt reassured. Pt advised to make use of PRN medications as ordered.  Per RN - pt often noted as anxious , tolerating medications well, continues to provide encouragement and support.      Principal Problem: Schizoaffective disorder, bipolar type (Maureen Swanson) Diagnosis:   Patient Active Problem List   Diagnosis Date Noted  . Hypomagnesemia [E83.42]   . Cannabis use disorder, moderate, dependence (Maureen Swanson) [F12.20] 11/20/2015  . Homicidal ideation [R45.850]   . Tobacco use disorder [F17.200] 07/20/2015  . Diabetes mellitus (Maureen Swanson) [E11.9] 07/20/2015  . Cocaine use disorder, moderate, dependence (Maureen Swanson) [F14.20] 04/03/2015  . Schizoaffective disorder, bipolar type (Maureen Swanson) [F25.0] 04/03/2015  . Alcohol use disorder, moderate, dependence (Maureen Swanson) [F10.20] 04/03/2015  . Acute ischemic stroke (Maureen Swanson) [I63.9] 12/08/2014  . Left-sided weakness [M62.89] 12/08/2014  . 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 Knee pain [M25.569] 10/17/2010  . MAMMOGRAM, ABNORMAL, RIGHT [R92.8] 08/05/2010  . Dyslipidemia [E78.5] 06/21/2010  . AMENORRHEA, SECONDARY [N91.2] 10/29/2009  . 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 GERD [K21.9] 10/11/2007  . Essential hypertension [I10] 02/26/2007   Total Time spent with patient: 25 minutes  Past Psychiatric History: as  per H&P.  Past Medical History:  Past Medical History:  Diagnosis Date  . Anxiety   . Arthritis    back and knees  . Asthma    daily and prn inhalers  . 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 Bipolar 1 disorder (Maureen Swanson)   . CHF (congestive heart failure) (Maureen Swanson)   . Depression   . Diabetes mellitus    diet-controlled  . Gastric ulcer   . GERD (gastroesophageal reflux disease)   . Gout   . Headache(784.0)    migraines  . High cholesterol   . Hypertension    under control, has been on med. x 12 yrs.  . Substance abuse    crack cocaine  . TMJ (temporomandibular joint disorder)     Past Surgical History:  Procedure Laterality Date  . BREAST LUMPECTOMY WITH NEEDLE LOCALIZATION  04/19/2012   Procedure: BREAST LUMPECTOMY WITH NEEDLE LOCALIZATION;  Surgeon: [name], MD;  Location: [facility];  Service: General;  Laterality: Right;  . KNEE ARTHROSCOPY W/ PARTIAL MEDIAL MENISCECTOMY  05/01/2010   right   Family History:  Family History  Problem Relation Age of Onset  . Diabetes Mother   . Breast cancer Mother   . Cancer Father   . Bipolar disorder Maternal Aunt   . Schizophrenia Maternal Grandfather   . Alcoholism Maternal Uncle    Family Psychiatric  History: See Above Social History: As per H&P. History  Alcohol Use  . 0.0 oz/week    Comment: reports she drinks 3 40  ounce beers daily     History  Drug Use  . Frequency: 5.0 times per week  . Types: Cocaine, Benzodiazepines    Comment: 02/28/16    Social History   Social History  . Marital status: Single    Spouse name: Maureen Swanson  . Number of children: Maureen Swanson  . Years of education: Maureen Swanson   Social History Main Topics  . Smoking status: Current Every Day Smoker    Packs/day: 1.00    Years: 0.00    Types: Cigarettes  . Smokeless tobacco: Never Used  . Alcohol use 0.0 oz/week     Comment: reports she drinks 3 40 ounce beers daily  . Drug use:     Frequency: 5.0 times per week     Types: Cocaine, Benzodiazepines     Comment: 02/28/16  . Sexual activity: No   Other Topics Concern  . None   Social History Narrative  . None   Additional Social History:    Pain Medications: See PTA Prescriptions: See PTA Over the Counter: See PTA History of alcohol / drug use?: Yes Longest period of sobriety (when/how long): 2 years Negative Consequences of Use: Financial, Legal Withdrawal Symptoms: Agitation Name of Substance 1: Cocaine 1 - Age of First Use: 22 1 - Amount (size/oz): $200 worth 1 - Frequency: Monthly/weekly 1 - Duration: Ongoing 1 - Last Use / Amount: 02/28/16                  Sleep: Poor   Appetite:  Fair  Current Medications: Current Facility-Administered Medications  Medication Dose Route Frequency Provider Last Rate Last Dose  . acetaminophen (TYLENOL) tablet 650 mg  650 mg Oral Q6H PRN Shuvon B Rankin, NP   650 mg at 03/05/16 0843  . alum & mag hydroxide-simeth (MAALOX/MYLANTA) 200-200-20 MG/5ML suspension 30 mL  30 mL Oral Q4H PRN Shuvon B Rankin, NP   30 mL at 03/03/16 0922  . amLODipine (NORVASC) tablet 5 mg  5 mg Oral Daily Shuvon B Rankin, NP   5 mg at 03/12/16 0820  . [START ON 03/19/2016] ARIPiprazole ER SUSR 400 mg  400 mg Intramuscular Q28 days [name], MD      . atenolol (TENORMIN) tablet 100 mg  100 mg Oral Daily Shuvon B Rankin, NP   100 mg at 03/12/16 0820  . atorvastatin (LIPITOR) tablet 40 mg  40 mg Oral q1800 Shuvon B Rankin, NP   40 mg at 03/11/16 1728  . benztropine (COGENTIN) tablet 0.5 mg  0.5 mg Oral QHS Shuvon B Rankin, NP   0.5 mg at 03/11/16 2157  . divalproex (DEPAKOTE) DR tablet 500 mg  500 mg Oral Q12H Shuvon B Rankin, NP   500 mg at 03/12/16 0820  . gabapentin (NEURONTIN) capsule 400 mg  400 mg Oral TID Shuvon B Rankin, NP   400 mg at 03/12/16 1205  . hydrOXYzine (ATARAX/VISTARIL) tablet 25 mg  25 mg Oral Q6H PRN [name], MD   25 mg at 03/06/16 1443  . insulin aspart (novoLOG) injection 0-15 Units  0-15  Units Subcutaneous TID WC [name], MD   3 Units at 03/12/16 1205  . insulin aspart (novoLOG) injection 0-5 Units  0-5 Units Subcutaneous QHS Benzion Mesta, MD      . insulin glargine (LANTUS) injection 24 Units  24 Units Subcutaneous QHS [name], PA-C   24 Units at 03/11/16 2200  . linagliptin (TRADJENTA) tablet 5 mg  5 mg Oral Daily Shuvon B Rankin,  NP   5 mg at 03/12/16 0820  . loperamide (IMODIUM) capsule 2 mg  2 mg Oral PRN Derrill Center, NP      . magnesium hydroxide (MILK OF MAGNESIA) suspension 30 mL  30 mL Oral Daily PRN Shuvon B Rankin, NP      . metFORMIN (GLUCOPHAGE) tablet 1,000 mg  1,000 mg Oral BID WC Shuvon B Rankin, NP   1,000 mg at 03/12/16 0820  . multivitamin with minerals tablet 1 tablet  1 tablet Oral Daily [name], MD   1 tablet at 03/12/16 0820  . OLANZapine (ZYPREXA) tablet 5 mg  5 mg Oral TID PRN [name], MD   5 mg at 03/05/16 1535   Or  . OLANZapine (ZYPREXA) injection 5 mg  5 mg Intramuscular TID PRN [name], MD      . perphenazine (TRILAFON) tablet 4 mg  4 mg Oral BH-qamhs Jamian Andujo, MD   4 mg at 03/12/16 0820  . thiamine (VITAMIN B-1) tablet 100 mg  100 mg Oral Daily [name], MD   100 mg at 03/12/16 [id number]  . traZODone (DESYREL) tablet 175 mg  175 mg Oral QHS [name], MD   175 mg at 03/11/16 2157    Lab Results:  Results for orders placed or performed during the hospital encounter of 03/02/16 (from the past 48 hour(s))  Glucose, capillary     Status: Abnormal   Collection Time: 03/10/16  4:42 PM  Result Value Ref Range   Glucose-Capillary 148 (H) 65 - 99 mg/dL  Glucose, capillary     Status: None   Collection Time: 03/10/16  8:55 PM  Result Value Ref Range   Glucose-Capillary 98 65 - 99 mg/dL  Glucose, capillary     Status: None   Collection Time: 03/11/16  6:17 AM  Result Value Ref Range   Glucose-Capillary 92 65 - 99 mg/dL  Glucose, capillary     Status: Abnormal   Collection Time: 03/11/16 11:53 AM   Result Value Ref Range   Glucose-Capillary 155 (H) 65 - 99 mg/dL  Glucose, capillary     Status: Abnormal   Collection Time: 03/11/16  5:11 PM  Result Value Ref Range   Glucose-Capillary 153 (H) 65 - 99 mg/dL  Glucose, capillary     Status: Abnormal   Collection Time: 03/11/16  8:29 PM  Result Value Ref Range   Glucose-Capillary 115 (H) 65 - 99 mg/dL  Glucose, capillary     Status: None   Collection Time: 03/12/16  6:23 AM  Result Value Ref Range   Glucose-Capillary 89 65 - 99 mg/dL  Glucose, capillary     Status: Abnormal   Collection Time: 03/12/16 11:32 AM  Result Value Ref Range   Glucose-Capillary 186 (H) 65 - 99 mg/dL   Comment 1 Notify RN    Comment 2 Document in Chart     Blood Alcohol level:  Lab Results  Component Value Date   ETH <5 02/29/2016   ETH 6 (H) 02/13/2016    Physical Findings: AIMS: Facial and Oral Movements Muscles of Facial Expression: None, normal Lips and Perioral Area: None, normal Jaw: None, normal Tongue: None, normal,Extremity Movements Upper (arms, wrists, hands, fingers): None, normal Lower (legs, knees, ankles, toes): None, normal, Trunk Movements Neck, shoulders, hips: None, normal, Overall Severity Severity of abnormal movements (highest score from questions above): None, normal Incapacitation due to abnormal movements: None, normal Patient's awareness of abnormal movements (rate only patient's report): No Awareness, Dental  Status Current problems with teeth and/or dentures?: No Does patient usually wear dentures?: No  CIWA:  CIWA-Ar Total: 2 COWS:     Musculoskeletal: Strength & Muscle Tone: within normal limits Gait & Station: normal Patient leans: Maureen Swanson  Psychiatric Specialty Exam: Review of Systems  Psychiatric/Behavioral: Positive for depression and substance abuse. The patient is nervous/anxious.   All other systems reviewed and are negative.   Blood pressure (!) 139/95, pulse 75, temperature 99.1 F (37.3 C),  temperature source Oral, resp. rate 16, height 5\' 4"  (1.626 m), weight 96.6 kg (213 lb).Body mass index is 36.56 kg/m.  General Appearance: Casual  Eye Contact::  Fair  Speech:  Clear and Coherent  Volume:  Normal  Mood:  Anxious and Depressed   Affect:  Depressed   Thought Process:  Goal Directed and Descriptions of Associations: Circumstantial  Orientation:  Full (Time, Place, and Person)  Thought Content:  Hallucinations: Auditory on and off - command asking her to hurt other people- improving  Suicidal Thoughts:  No    Homicidal Thoughts:  No   Memory:  Immediate;   Fair Recent;   Fair Remote;   Fair  Judgement:  Fair  Insight:  Shallow  Psychomotor Activity:  Restlessness  Concentration:  Fair  Recall:  [employer] of Knowledge:Fair  Language: Fair  Akathisia:  No  Handed:  Right  AIMS (if indicated):     Assets:  Communication Skills Desire for Improvement Resilience  ADL's:  Intact  Cognition: WNL  Sleep:  Number of Hours: 7     Treatment Plan Summary:Denajah Jerilynn Mages Prior is a  50 y.o.AA female , who has a hx of schizoaffective do , cocaine, alcohol, cannabis abuse, who voluntarily presents to [facility] s/p SI with plan to cut. Pt seen today with sleep issues and anxiety sx.  Will continue treatment.  Daily contact with patient to assess and evaluate symptoms and progress in treatment and Medication management   Will continue Abilify Maintena IM 400 mg - last dose on 02/20/16- repeat q28 days. Will continue Gabapentin 400 mg po tid for mood lability/anxiety/pain. Will continue Trazodone 175  mg po qhs for sleep.Patient to make use of vistaril along with trazodone for sleep as needed. Increased Trilafon to 4 mg po bid for augmenting the effect of Abilify Maintena IM. Will continue Depakote DR 500 mg po bid for mood lability. Depakote level on 03/07/16 -76 Restarted home medications as needed. Will continue to monitor vitals ,medication compliance and treatment side effects while  patient is here.  Will monitor for medical issues as well as call consult as needed.  Reviewed labs cbc - hb - low , cmp- creatinine slightly elevated, TSH - wnl ( 08/10/15) , lipid panel - wnl ( 08/10/15) , hba1c-5.1 ( 11/20/15) ,uds - pos for BZD , cocaine , BAL - <5 , EKG for qtc monitoring-02/14/16- WNL .  CSW will continue working on disposition. Patient to be followed by [facility] after discharge . Patient to participate in therapeutic milieu .   [place], MD 03/12/2016, 1:31 PM

## 2016-03-13 LAB — GLUCOSE, CAPILLARY
GLUCOSE-CAPILLARY: 201 mg/dL — AB (ref 65–99)
Glucose-Capillary: 86 mg/dL (ref 65–99)
Glucose-Capillary: 120 mg/dL — ABNORMAL HIGH (ref 65–99)
Glucose-Capillary: 203 mg/dL — ABNORMAL HIGH (ref 65–99)

## 2016-03-13 MED ORDER — PERPHENAZINE 4 MG PO TABS
4.0000 mg | ORAL_TABLET | ORAL | Status: DC
  Administered 2016-03-14: 4 mg via ORAL
  Filled 2016-03-13 (×2): qty 1

## 2016-03-13 MED ORDER — TRAZODONE HCL 100 MG PO TABS
200.0000 mg | ORAL_TABLET | Freq: Every day | ORAL | Status: DC
  Administered 2016-03-13: 200 mg via ORAL
  Filled 2016-03-13 (×2): qty 2

## 2016-03-13 MED ORDER — PERPHENAZINE 8 MG PO TABS
8.0000 mg | ORAL_TABLET | Freq: Every day | ORAL | Status: DC
  Administered 2016-03-13: 8 mg via ORAL
  Filled 2016-03-13 (×2): qty 1

## 2016-03-13 NOTE — Progress Notes (Signed)
   D: Pt informed the writer that she wasn't discharged today because "they didn't have any beds". Stated, "I think they're trying to keep me from going back to where I was". Referring to the home with her boyfriend. Pt stated, "I'm 50 yrs old. I need some where stable". Pt has no questions or concerns.    A:  Support and encouragement was offered. 15 min checks continued for safety.  R: Pt remains safe.

## 2016-03-13 NOTE — Progress Notes (Signed)
Nursing Note 03/13/2016 [id number]  Data Reports sleeping fair without PRN sleep med.  Rates depression 8/10, hopelessness 8/10, and anxiety 8/10. Affect blunted but appropriate mood "depressed."  Denied HI, SI, AVH verbally, reported passive SI on inventory sheet but agrees to come to staff before acting on any harmful thoughts.   Action Spoke with patient 1:1, nurse offered support to patient throughout shift.  Tylenol given for pain.  Continues to be monitored on 15 minute checks for safety.  Response Tylenol minimally effective.  Remains safe and appropriate on unit.

## 2016-03-13 NOTE — Psych (Signed)
She answered questions from the patient's self inventory sheet on 03/13/2016 that her sleep and appetite was fair and energy was low. She rated her depression, anxiety and hopelessness an eight on a scale from 0-10. During our conversion she stated she is ready to do be discharged once she has a bed at the shelter. GM Nursing Student [id number] 03/13/2016.

## 2016-03-13 NOTE — Progress Notes (Signed)
   D: Pt was in the dayroom watching tv and interacting with her peers. Pt has no questions or concerns.    A:  Support and encouragement was offered. 15 min checks continued for safety.  R: Pt remains safe.

## 2016-03-13 NOTE — [facility] Group Notes (Signed)
Type of Therapy: Group Therapy   Participation Level: Engaged  Participation Quality: Attentive  Affect: Flat    Cognitive: Alert   Insight: Engaged  Engagement in Therapy: Improving   Modes of Intervention: Education, Exploration, Socialization   [employer], spelling the word obstacle. Group members were asked to identify an obstacle/obstacles they are currently facing and also first steps in overcoming those obstacles   Summary of Progress/Problems: Maureen Swanson was engaged throughout, and stayed entire time. Maureen Swanson stated that her biggest obstacle going forward was finding stability. For Maureen Swanson, stability represents no hospitalizations, constant income, and a place to stay. By overcoming this obstacle, Stellar plans to stay away from drugs and alcohol.

## 2016-03-13 NOTE — Progress Notes (Signed)
[facility] MD Progress Note  03/13/2016 1:12 PM Maureen Swanson  MRN:  [id number] Subjective:  Patient reports " I could not sleep last night again and I am anxious and I am hearing voices saying neagtive things again. I also felt suicidal this AM - while I was walking to the cafeteria , I had thoughts to take something sharp and hurt myself."       Objective: Maureen Swanson  is a 50 y.o. AA female, who is divorced , has a hx of schizoaffective do as well as cocaine abuse, alcohol abuse , stimulant abuse , who presented [facility] ED with worsening depression and SI with plan to cut self. Maureen Swanson is well [facility] .  Chandal today continues to report inability to sleep . Maureen Swanson reports she continues to be anxious and her AH that had subsided is coming back. She wonders if her medications can be increased to address this. Maureen Swanson also reports continued SI - with thoughts coming on and off , and she also had plan to take a sharp object this AM. However she was able to cope with the thoughts. Pt contracts for safety on the unit. Maureen Swanson also continues to be a placement problem. Maureen Swanson has been referred to various shelters - pending bed availability. Maureen Swanson does have hx of several suicide attempts , her co existing cocaine abuse makes her affective and psychotic sx worse, and she has relational issues with her son and her ex boyfriend , which makes things worse for her.  Per RN - pt often noted as anxious ,depressed , continued AH and SI,  tolerating medications well, continues to provide encouragement and support.      Principal Problem: Schizoaffective disorder, bipolar type ([place]) Diagnosis:   Patient Active Problem List   Diagnosis Date Noted  . Hypomagnesemia [E83.42]   . Cannabis use disorder, moderate, dependence ([place]) [F12.20] 11/20/2015  . Homicidal ideation [R45.850]   . Tobacco use disorder [F17.200] 07/20/2015  . Diabetes mellitus ([place]) [E11.9] 07/20/2015  . Cocaine use disorder, moderate,  dependence ([place]) [F14.20] 04/03/2015  . Schizoaffective disorder, bipolar type ([place]) [F25.0] 04/03/2015  . Alcohol use disorder, moderate, dependence ([place]) [F10.20] 04/03/2015  . Acute ischemic stroke ([place]) [I63.9] 12/08/2014  . Left-sided weakness [M62.89] 12/08/2014  . 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 Knee pain [M25.569] 10/17/2010  . MAMMOGRAM, ABNORMAL, RIGHT [R92.8] 08/05/2010  . Dyslipidemia [E78.5] 06/21/2010  . AMENORRHEA, SECONDARY [N91.2] 10/29/2009  . 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 GERD [K21.9] 10/11/2007  . Essential hypertension [I10] 02/26/2007   Total Time spent with patient: 25 minutes  Past Psychiatric History: as per H&P.  Past Medical History:  Past Medical History:  Diagnosis Date  . Anxiety   . Arthritis    back and knees  . Asthma    daily and prn inhalers  . 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 Bipolar 1 disorder ([place])   . CHF (congestive heart failure) ([place])   . Depression   . Diabetes mellitus    diet-controlled  . Gastric ulcer   . GERD (gastroesophageal reflux disease)   . Gout   . Headache(784.0)    migraines  . High cholesterol   . Hypertension    under control, has been on med. x 12 yrs.  . Substance abuse    crack cocaine  . TMJ (temporomandibular joint disorder)     Past Surgical History:  Procedure Laterality Date  .  BREAST LUMPECTOMY WITH NEEDLE LOCALIZATION  04/19/2012   Procedure: BREAST LUMPECTOMY WITH NEEDLE LOCALIZATION;  Surgeon: [name], MD;  Location: [place];  Service: General;  Laterality: Right;  . KNEE ARTHROSCOPY W/ PARTIAL MEDIAL MENISCECTOMY  05/01/2010   right   Family History:  Family History  Problem Relation Age of Onset  . Diabetes Mother   . Breast cancer Mother   . Cancer Father   . Bipolar disorder Maternal Aunt   . Schizophrenia Maternal Grandfather   . Alcoholism Maternal Uncle    Family Psychiatric  History: See  Above Social History: As per H&P. History  Alcohol Use  . 0.0 oz/week    Comment: reports she drinks 3 40 ounce beers daily     History  Drug Use  . Frequency: 5.0 times per week  . Types: Cocaine, Benzodiazepines    Comment: 02/28/16    Social History   Social History  . Marital status: Single    Spouse name: N/A  . Number of children: N/A  . Years of education: N/A   Social History Main Topics  . Smoking status: Current Every Day Smoker    Packs/day: 1.00    Years: 0.00    Types: Cigarettes  . Smokeless tobacco: Never Used  . Alcohol use 0.0 oz/week     Comment: reports she drinks 3 40 ounce beers daily  . Drug use:     Frequency: 5.0 times per week    Types: Cocaine, Benzodiazepines     Comment: 02/28/16  . Sexual activity: No   Other Topics Concern  . None   Social History Narrative  . None   Additional Social History:    Pain Medications: See PTA Prescriptions: See PTA Over the Counter: See PTA History of alcohol / drug use?: Yes Longest period of sobriety (when/how long): 2 years Negative Consequences of Use: Financial, Legal Withdrawal Symptoms: Agitation Name of Substance 1: Cocaine 1 - Age of First Use: 22 1 - Amount (size/oz): $200 worth 1 - Frequency: Monthly/weekly 1 - Duration: Ongoing 1 - Last Use / Amount: 02/28/16                  Sleep: Poor   Appetite:  Fair  Current Medications: Current Facility-Administered Medications  Medication Dose Route Frequency Provider Last Rate Last Dose  . acetaminophen (TYLENOL) tablet 650 mg  650 mg Oral Q6H PRN Shuvon B Rankin, NP   650 mg at 03/13/16 0846  . alum & mag hydroxide-simeth (MAALOX/MYLANTA) 200-200-20 MG/5ML suspension 30 mL  30 mL Oral Q4H PRN Shuvon B Rankin, NP   30 mL at 03/03/16 0922  . amLODipine (NORVASC) tablet 5 mg  5 mg Oral Daily Shuvon B Rankin, NP   5 mg at 03/13/16 0846  . [START ON 03/19/2016] ARIPiprazole ER SUSR 400 mg  400 mg Intramuscular Q28 days [name], MD       . atenolol (TENORMIN) tablet 100 mg  100 mg Oral Daily Shuvon B Rankin, NP   100 mg at 03/13/16 0846  . atorvastatin (LIPITOR) tablet 40 mg  40 mg Oral q1800 Shuvon B Rankin, NP   40 mg at 03/12/16 1723  . benztropine (COGENTIN) tablet 0.5 mg  0.5 mg Oral QHS Shuvon B Rankin, NP   0.5 mg at 03/12/16 2139  . divalproex (DEPAKOTE) DR tablet 500 mg  500 mg Oral Q12H Shuvon B Rankin, NP   500 mg at 03/13/16 0847  . gabapentin (NEURONTIN)  capsule 400 mg  400 mg Oral TID Shuvon B Rankin, NP   400 mg at 03/13/16 1207  . insulin aspart (novoLOG) injection 0-15 Units  0-15 Units Subcutaneous TID WC [name], MD   2 Units at 03/12/16 1724  . insulin aspart (novoLOG) injection 0-5 Units  0-5 Units Subcutaneous QHS Archit Leger, MD      . insulin glargine (LANTUS) injection 24 Units  24 Units Subcutaneous QHS [name], PA-C   24 Units at 03/12/16 2139  . linagliptin (TRADJENTA) tablet 5 mg  5 mg Oral Daily Shuvon B Rankin, NP   5 mg at 03/13/16 0847  . loperamide (IMODIUM) capsule 2 mg  2 mg Oral PRN Derrill Center, NP      . magnesium hydroxide (MILK OF MAGNESIA) suspension 30 mL  30 mL Oral Daily PRN Shuvon B Rankin, NP      . metFORMIN (GLUCOPHAGE) tablet 1,000 mg  1,000 mg Oral BID WC Shuvon B Rankin, NP   1,000 mg at 03/13/16 0847  . multivitamin with minerals tablet 1 tablet  1 tablet Oral Daily [name], MD   1 tablet at 03/13/16 0847  . OLANZapine (ZYPREXA) tablet 5 mg  5 mg Oral TID PRN [name], MD   5 mg at 03/05/16 1535   Or  . OLANZapine (ZYPREXA) injection 5 mg  5 mg Intramuscular TID PRN [name], MD      . Derrill Memo ON 03/14/2016] perphenazine (TRILAFON) tablet 4 mg  4 mg Oral BH-q7a Lendora Keys, MD      . perphenazine (TRILAFON) tablet 8 mg  8 mg Oral QHS Tacari Repass, MD      . thiamine (VITAMIN B-1) tablet 100 mg  100 mg Oral Daily Tiane Szydlowski, MD   100 mg at 03/13/16 0847  . traZODone (DESYREL) tablet 200 mg  200 mg Oral QHS [name], MD         Lab Results:  Results for orders placed or performed during the hospital encounter of 03/02/16 (from the past 48 hour(s))  Glucose, capillary     Status: Abnormal   Collection Time: 03/11/16  5:11 PM  Result Value Ref Range   Glucose-Capillary 153 (H) 65 - 99 mg/dL  Glucose, capillary     Status: Abnormal   Collection Time: 03/11/16  8:29 PM  Result Value Ref Range   Glucose-Capillary 115 (H) 65 - 99 mg/dL  Glucose, capillary     Status: None   Collection Time: 03/12/16  6:23 AM  Result Value Ref Range   Glucose-Capillary 89 65 - 99 mg/dL  Glucose, capillary     Status: Abnormal   Collection Time: 03/12/16 11:32 AM  Result Value Ref Range   Glucose-Capillary 186 (H) 65 - 99 mg/dL   Comment 1 Notify RN    Comment 2 Document in Chart   Glucose, capillary     Status: Abnormal   Collection Time: 03/12/16  4:45 PM  Result Value Ref Range   Glucose-Capillary 139 (H) 65 - 99 mg/dL   Comment 1 Notify RN    Comment 2 Document in Chart   Glucose, capillary     Status: None   Collection Time: 03/12/16  9:05 PM  Result Value Ref Range   Glucose-Capillary 88 65 - 99 mg/dL  Glucose, capillary     Status: None   Collection Time: 03/13/16  6:04 AM  Result Value Ref Range   Glucose-Capillary 86 65 - 99 mg/dL   Comment  1 Notify RN   Glucose, capillary     Status: Abnormal   Collection Time: 03/13/16 11:34 AM  Result Value Ref Range   Glucose-Capillary 120 (H) 65 - 99 mg/dL    Blood Alcohol level:  Lab Results  Component Value Date   ETH <5 02/29/2016   ETH 6 (H) 02/13/2016    Physical Findings: AIMS: Facial and Oral Movements Muscles of Facial Expression: None, normal Lips and Perioral Area: None, normal Jaw: None, normal Tongue: None, normal,Extremity Movements Upper (arms, wrists, hands, fingers): None, normal Lower (legs, knees, ankles, toes): None, normal, Trunk Movements Neck, shoulders, hips: None, normal, Overall Severity Severity of abnormal movements (highest  score from questions above): None, normal Incapacitation due to abnormal movements: None, normal Patient's awareness of abnormal movements (rate only patient's report): No Awareness, Dental Status Current problems with teeth and/or dentures?: No Does patient usually wear dentures?: No  CIWA:  CIWA-Ar Total: 2 COWS:     Musculoskeletal: Strength & Muscle Tone: within normal limits Gait & Station: normal Patient leans: N/A  Psychiatric Specialty Exam: Review of Systems  Psychiatric/Behavioral: Positive for depression, hallucinations, substance abuse and suicidal ideas. The patient is nervous/anxious.   All other systems reviewed and are negative.   Blood pressure 139/90, pulse 82, temperature 98 F (36.7 C), temperature source Oral, resp. rate 18, height 5\' 4"  (1.626 m), weight 96.6 kg (213 lb).Body mass index is 36.56 kg/m.  General Appearance: Casual  Eye Contact::  Fair  Speech:  Clear and Coherent  Volume:  Normal  Mood:  Anxious and Depressed   Affect:  Depressed   Thought Process:  Goal Directed and Descriptions of Associations: Circumstantial  Orientation:  Full (Time, Place, and Person)  Thought Content:  Hallucinations: Auditory on and off - command asking her to hurt other people- improving  Suicidal Thoughts:  Yes. Had plan this AM to use a sharp object , but was able to cope with her thoughts   Homicidal Thoughts:  No   Memory:  Immediate;   Fair Recent;   Fair Remote;   Fair  Judgement:  Fair  Insight:  Shallow  Psychomotor Activity:  Restlessness  Concentration:  Fair  Recall:  [employer] of Knowledge:Fair  Language: Fair  Akathisia:  No  Handed:  Right  AIMS (if indicated):     Assets:  Communication Skills Desire for Improvement Resilience  ADL's:  Intact  Cognition: WNL  Sleep:  Number of Hours: 6.5     Treatment Plan Summary:Maureen Swanson is a  50 y.o.AA female , who has a hx of schizoaffective do , cocaine, alcohol, cannabis abuse, who voluntarily  presents to [facility] s/p SI with plan to cut. Pt seen today with sleep issues, AH , SI  and anxiety sx.  Will continue treatment.  Daily contact with patient to assess and evaluate symptoms and progress in treatment and Medication management   Will continue Abilify Maintena IM 400 mg - last dose on 02/20/16- repeat q28 days. Will continue Gabapentin 400 mg po tid for mood lability/anxiety/pain. Will increase Trazodone to 200 mg po qhs for sleep.Patient to make use of vistaril along with trazodone for sleep as needed. Will increase Trilafon to 4 mg po daily and 8 mg po qhs for augmenting the effect of Abilify Maintena IM. Will continue Depakote DR 500 mg po bid for mood lability. Depakote level on 03/07/16 -76 Restarted home medications as needed. Will continue to monitor vitals ,medication compliance and treatment side effects  while patient is here.  Will monitor for medical issues as well as call consult as needed.  Reviewed labs cbc - hb - low , cmp- creatinine slightly elevated, TSH - wnl ( 08/10/15) , lipid panel - wnl ( 08/10/15) , hba1c-5.1 ( 11/20/15) ,uds - pos for BZD , cocaine , BAL - <5 , EKG for qtc monitoring-02/14/16- WNL .  CSW will continue working on disposition. Patient to be followed by ACTT/PATH after discharge . Patient to participate in therapeutic milieu .  Donterius Filley, MD 03/13/2016, 1:12 PM

## 2016-03-14 LAB — GLUCOSE, CAPILLARY
GLUCOSE-CAPILLARY: 89 mg/dL (ref 65–99)
GLUCOSE-CAPILLARY: 99 mg/dL (ref 65–99)

## 2016-03-14 MED ORDER — PERPHENAZINE 4 MG PO TABS: ORAL_TABLET | ORAL | 0 refills | Status: DC

## 2016-03-14 MED ORDER — TRAZODONE HCL 100 MG PO TABS
200.0000 mg | ORAL_TABLET | Freq: Every day | ORAL | 0 refills | Status: DC
Start: 2016-03-14 — End: 2017-09-29

## 2016-03-14 NOTE — Progress Notes (Addendum)
Nursing Note 03/14/2016 0700-1300  Data Reports sleeping fair with/without PRN sleep med.  Rates depression 8/10, hopelessness 8/10, and anxiety 8/10. Affect blunted but appropriate mood "depressed and anxious."  Denies HI, SI, AVH.  States she feels about the same as yesterday.  Attending groups.  Denies physical complaints today.    Action Spoke with patient 1:1, nurse offered support to patient throughout shift.  . Received discharge orders.  Reviewed medications, discharge instructions, and follow up appointments with patient. Medication samples reviewed and handed to patient with scripts.  Paperwork, AVS, SRA, and transition record handed to patient.   Escorted off of unit at  1300. Belongings returned per belongings form.  Discharged to lobby where she was met by member of her ACTT team.   Response Verbalized understanding of discharge instructions.  Agrees to contact someone or 911 if she has thoughts/intent to harm self or others.  To follow up per AVS.

## 2016-03-14 NOTE — Discharge Summary (Signed)
Physician Discharge Summary Note  Patient:  Maureen Swanson is an 50 y.o., female  MRN:  [id number]  DOB:  01/25/1966  Patient phone:  [phone] (home)   Patient address:   [address] [place] [address],   Total Time spent with patient: Greater than 30 minutes  Date of Admission:  03/02/2016  Date of Discharge: 03-14-16  Reason for Admission: Suicidal ideations.  Principal Problem: Schizoaffective disorder, bipolar type [facility])  Discharge Diagnoses: Patient Active Problem List   Diagnosis Date Noted  . Hypomagnesemia [E83.42]   . Cannabis use disorder, moderate, dependence ([place]) [F12.20] 11/20/2015  . Homicidal ideation [R45.850]   . Tobacco use disorder [F17.200] 07/20/2015  . Diabetes mellitus ([place]) [E11.9] 07/20/2015  . Cocaine use disorder, moderate, dependence ([place]) [F14.20] 04/03/2015  . Schizoaffective disorder, bipolar type ([place]) [F25.0] 04/03/2015  . Alcohol use disorder, moderate, dependence ([place]) [F10.20] 04/03/2015  . Acute ischemic stroke ([place]) [I63.9] 12/08/2014  . Left-sided weakness [M62.89] 12/08/2014  . 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 Knee pain [M25.569] 10/17/2010  . MAMMOGRAM, ABNORMAL, RIGHT [R92.8] 08/05/2010  . Dyslipidemia [E78.5] 06/21/2010  . AMENORRHEA, SECONDARY [N91.2] 10/29/2009  . 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 GERD [K21.9] 10/11/2007  . Essential hypertension [I10] 02/26/2007   Past Psychiatric History: Schizoaffective disorder  Past Medical History:  Past Medical History:  Diagnosis Date  . Anxiety   . Arthritis    back and knees  . Asthma    daily and prn inhalers  . 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 Bipolar 1 disorder ([place])   . CHF (congestive heart failure) ([place])   . Depression   . Diabetes mellitus    diet-controlled  . Gastric ulcer   . GERD (gastroesophageal reflux disease)   . Gout   . Headache(784.0)    migraines  . High cholesterol   .  Hypertension    under control, has been on med. x 12 yrs.  . Substance abuse    crack cocaine  . TMJ (temporomandibular joint disorder)     Past Surgical History:  Procedure Laterality Date  . BREAST LUMPECTOMY WITH NEEDLE LOCALIZATION  04/19/2012   Procedure: BREAST LUMPECTOMY WITH NEEDLE LOCALIZATION;  Surgeon: [name], MD;  Location: [place];  Service: General;  Laterality: Right;  . KNEE ARTHROSCOPY W/ PARTIAL MEDIAL MENISCECTOMY  05/01/2010   right   Family History:  Family History  Problem Relation Age of Onset  . Diabetes Mother   . Breast cancer Mother   . Cancer Father   . Bipolar disorder Maternal Aunt   . Schizophrenia Maternal Grandfather   . Alcoholism Maternal Uncle    Family Psychiatric  History: See H&P  Social History:  History  Alcohol Use  . 0.0 oz/week    Comment: reports she drinks 3 40 ounce beers daily     History  Drug Use  . Frequency: 5.0 times per week  . Types: Cocaine, Benzodiazepines    Comment: 02/28/16    Social History   Social History  . Marital status: Single    Spouse name: N/A  . Number of children: N/A  . Years of education: N/A   Social History Main Topics  . Smoking status: Current Every Day Smoker    Packs/day: 1.00    Years: 0.00    Types: Cigarettes  . Smokeless tobacco: Never Used  . Alcohol use 0.0 oz/week     Comment:  reports she drinks 3 40 ounce beers daily  . Drug use:     Frequency: 5.0 times per week    Types: Cocaine, Benzodiazepines     Comment: 02/28/16  . Sexual activity: No   Other Topics Concern  . None   Social History Narrative  . None   Hospital Course:Demiyah M Nealis a 50 y.o.femalewith a history of Bipolar Disorder and cocaine use who presented voluntarily to [facility] with complaint of suicidal ideation. Pt was discharged from [facility] on 02/25/16. She had previously been treated at [facility] in May and June 2017. Pt reported that on 02/28/16, her boyfriend kicked her out of his  home. She said that she spent the night wandering the streets and feeling depressed. Pt reported that today she feels suicidal with plan to overdose, cut her wrists, or walk into traffic. In addition to suicidal ideation, Pt endorsed persistent and unremitting sadness, insomnia, and auditory hallucinations (voices telling her to kill herself). Pt also endorsed recent use of cocaine -- UDS was positive for cocaine and benzos.   This is one of Pansie several discharge summaries from this hospital alone. She was re-admitted to the hospital with her UDS test reports showing positive Benzodiazepine & Cocaine. However, her reason for admission was suicide ideations with multple plans to hurt herself. She was in need of mood stabilization treatment. After evaluation of her symptoms, Farnaz was started on medication regimen for her presenting symptoms. Her medication regimen included; Abilify  Susr 400 mg IM (due on 03-19-16)  for mood control, Perphenazine 4 mg & 8 mg respectively for mood control, Benztropine 0.5 mg for EPS, Depakote 500 mg for mood stabilization, Gabapentin 400 mg for agitation, Hydroxyzine 25 mg prn for anxiety & Trazodone 200 mg for insomnia.  She was also enrolled & participated in the group counseling sessions being offered and held on this unit, she learned coping skills that should help her cope better and maintain mood stability after discharge. She presented other significant pre-existing health issues that required treatment and or monitoring. Her pertinent home medications were resumed accordingly. Shamieka tolerated her treatment regimen without any reported significant adverse effects and or reactions.  During the course of her hospitalization, Cing's symptoms were evaluated on daily basis by a clinical provider to ascertain her symptoms were responding well to her treatment regimen. This is evidenced by her reports of decreasing symptoms, improved mood & presentation of good affect. She  is currently being discharged to continue psychiatric treatment and medication management on outpatient basis with her ACT team. She is provided with all the pertinent information required to make this appointment without problems.   On this day of her hospital discharge, Mayzie is in much improved condition than upon admission. She contracted for her safety and felt more in control of her mood. Her symptoms were reported as significantly decreased or resolved completely.Makaiah denies SI/HI and voiced no AVH. She is instructed & motivated to continue taking medications with a goal of continued improvement in mental health. She was provided with prescriptions, along with a 7 days worth of sample supply for all discharge medications. She was picked up by her ACT team. She left [facility] in no apparent distress with all belongings.  Physical Findings: AIMS: Facial and Oral Movements Muscles of Facial Expression: None, normal Lips and Perioral Area: None, normal Jaw: None, normal Tongue: None, normal,Extremity Movements Upper (arms, wrists, hands, fingers): None, normal Lower (legs, knees, ankles, toes): None, normal, Trunk Movements Neck, shoulders, hips: None,  normal, Overall Severity Severity of abnormal movements (highest score from questions above): None, normal Incapacitation due to abnormal movements: None, normal Patient's awareness of abnormal movements (rate only patient's report): No Awareness, Dental Status Current problems with teeth and/or dentures?: No Does patient usually wear dentures?: No  CIWA:  CIWA-Ar Total: 2 COWS:     Musculoskeletal: Strength & Muscle Tone: within normal limits Gait & Station: normal Patient leans: N/A  Psychiatric Specialty Exam: Physical Exam  Constitutional: She appears well-developed.  HENT:  Head: Normocephalic.  Eyes: Pupils are equal, round, and reactive to light.  Neck: Normal range of motion.  Cardiovascular: Normal rate.   Respiratory: Effort  normal.  GI: Soft.  Genitourinary:  Genitourinary Comments: Denies any issues in this area  Musculoskeletal: Normal range of motion.  Neurological: She is alert.  Skin: Skin is warm.    Review of Systems  Constitutional: Negative.   HENT: Negative.   Eyes: Negative.   Respiratory: Negative.   Cardiovascular: Negative.   Gastrointestinal: Negative.   Genitourinary: Negative.   Musculoskeletal: Negative.   Skin: Negative.   Neurological: Negative.   Endo/Heme/Allergies: Negative.   Psychiatric/Behavioral: Positive for depression (Stable), hallucinations (Hx. Psychosis) and substance abuse (Hx. Cocaine/alcohol use disorder). Negative for memory loss and suicidal ideas. The patient has insomnia (Stable). The patient is not nervous/anxious.     Blood pressure (!) 135/97, pulse 78, temperature 98.1 F (36.7 C), temperature source Oral, resp. rate 18, height 5\' 4"  (1.626 m), weight 96.6 kg (213 lb).Body mass index is 36.56 kg/m.  See Md's SRA   Have you used any form of tobacco in the last 30 days? (Cigarettes, Smokeless Tobacco, Cigars, and/or Pipes): Yes  Has this patient used any form of tobacco in the last 30 days? (Cigarettes, Smokeless Tobacco, Cigars, and/or Pipes): No  Blood Alcohol level:  Lab Results  Component Value Date   ETH <5 02/29/2016   ETH 6 (H) [id number]   Metabolic Disorder Labs:  Lab Results  Component Value Date   HGBA1C 5.1 11/20/2015   MPG 100 11/20/2015   MPG 120 08/07/2015   Lab Results  Component Value Date   PROLACTIN 5.7 08/10/2015   PROLACTIN 141.3 (H) 07/21/2015   Lab Results  Component Value Date   CHOL 159 08/10/2015   TRIG 122 08/10/2015   HDL 48 08/10/2015   CHOLHDL 3.3 08/10/2015   VLDL 24 08/10/2015   LDLCALC 87 08/10/2015   LDLCALC 63 07/21/2015   See Psychiatric Specialty Exam and Suicide Risk Assessment completed by Attending Physician prior to discharge.  Discharge destination:  Home  Is patient on multiple antipsychotic  therapies at discharge: Yes,    Do you recommend tapering to monotherapy for antipsychotics?  Yes    Has Patient had three or more failed trials of antipsychotic monotherapy by history:  Yes,   Antipsychotic medications that previously failed include:   1.  Seroquel., 2.  Lorayne Bender. and 3.  Zyprexa.  Recommended Plan for Multiple Antipsychotic Therapies: And because Valeri has not been able to achieve symptoms control under an antipsychotic monotherapy, she is currently receiving and being discharged on 2 separate antipsychotic medications Invega injectable & Perphanezine tablets which seem effective at this time. It will benefit Kyden to continue on these combination antipsychotic therapies as recommended to continue maximum symptom control. However, as her symptoms continue to improve, patient may be titrated down to an antipsychotic monotherapy to prevent or help minimize the risks for metabolic syndrome/other related adverse effects associated with use  of multiple antipsychotic medications. This has to be done under the discretion and proper judgement of her outpatient provider.  Discharge Instructions    Discharge instructions    Complete by:  As directed    Take all medications as prescribed. Keep all follow-up appointments as scheduled.  Do not consume alcohol or use illegal drugs while on prescription medications. Report any adverse effects from your medications to your primary care provider promptly.  In the event of recurrent symptoms or worsening symptoms, call 911, a crisis hotline, or go to the nearest emergency department for evaluation.       Medication List    TAKE these medications     Indication  amLODipine 5 MG tablet Commonly known as:  NORVASC Take 1 tablet (5 mg total) by mouth daily.  Indication:  High Blood Pressure Disorder   ARIPiprazole ER 400 MG Susr Inject 400 mg into the muscle every 28 (twenty-eight) days. Due next Oct 4.  Last dose was given Sept 6. Start  taking on:  03/19/2016  Indication:  Schizophrenia   atenolol 100 MG tablet Commonly known as:  TENORMIN Take 1 tablet (100 mg total) by mouth daily.  Indication:  High Blood Pressure Disorder   atorvastatin 40 MG tablet Commonly known as:  LIPITOR Take 1 tablet (40 mg total) by mouth daily at 6 PM.  Indication:  High Amount of Triglycerides in the Blood   benztropine 0.5 MG tablet Commonly known as:  COGENTIN Take 1 tablet (0.5 mg total) by mouth at bedtime.  Indication:  Extrapyramidal Reaction caused by Medications   divalproex 500 MG DR tablet Commonly known as:  DEPAKOTE Take 1 tablet (500 mg total) by mouth every 12 (twelve) hours. What changed:  medication strength  how much to take  when to take this  Indication:  mood stabilization   gabapentin 400 MG capsule Commonly known as:  NEURONTIN Take 1 capsule (400 mg total) by mouth 3 (three) times daily.  Indication:  Agitation   hydrOXYzine 25 MG tablet Commonly known as:  ATARAX/VISTARIL Take 1 tablet (25 mg total) by mouth every 6 (six) hours as needed for anxiety (or CIWA score </= 10). What changed:  when to take this  reasons to take this  Indication:  Anxiety Neurosis, Sedation   insulin glargine 100 UNIT/ML injection Commonly known as:  LANTUS Inject 0.24 mLs (24 Units total) into the skin at bedtime. What changed:  how much to take  Indication:  Type 2 Diabetes   linagliptin 5 MG Tabs tablet Commonly known as:  TRADJENTA Take 1 tablet (5 mg total) by mouth daily.  Indication:  Type 2 Diabetes   metFORMIN 1000 MG tablet Commonly known as:  GLUCOPHAGE Take 1 tablet (1,000 mg total) by mouth 2 (two) times daily with a meal.  Indication:  Type 2 Diabetes   multivitamin with minerals Tabs tablet Take 1 tablet by mouth daily.  Indication:  Decrease in Appetite   perphenazine 4 MG tablet Commonly known as:  TRILAFON Take 1 tablet (4 mg) in the morning & 2 tablets (8 mg) at bedtime: For mood  control What changed:  medication strength  how much to take  how to take this  when to take this  additional instructions  Indication:  Mood control   traZODone 100 MG tablet Commonly known as:  DESYREL Take 2 tablets (200 mg total) by mouth at bedtime. For sleep What changed:  medication strength  how much to take  additional instructions  Indication:  [place] Follow up on 03/14/2016.   Why:  Patient current w this provider for ACT services.  Agency will pick you up at 1:00 the day of discharge, 9/29, and will resume services. Contact information: [place]  [address] Phone:  [phone] Fax: [phone]         Follow-up recommendations: Activity:  As tolerated Diet: As recommended by your primary care doctor. Keep all scheduled follow-up appointments as recommended.   Comments: Patient is instructed prior to discharge to: Take all medications as prescribed by his/her mental healthcare provider. Report any adverse effects and or reactions from the medicines to his/her outpatient provider promptly. Patient has been instructed & cautioned: To not engage in alcohol and or illegal drug use while on prescription medicines. In the event of worsening symptoms, patient is instructed to call the crisis hotline, 911 and or go to the nearest ED for appropriate evaluation and treatment of symptoms. To follow-up with his/her primary care provider for your other medical issues, concerns and or health care needs.   Signed: Encarnacion Slates, NP, PMHNP, FNP-BC 03/14/2016, 10:27 AM

## 2016-03-14 NOTE — BHH Suicide Risk Assessment (Signed)
[facility] Discharge Suicide Risk Assessment   Principal Problem: Schizoaffective disorder, bipolar type [facility]) Discharge Diagnoses:  Patient Active Problem List   Diagnosis Date Noted  . Hypomagnesemia [E83.42]   . Cannabis use disorder, moderate, dependence ([place]) [F12.20] 11/20/2015  . Homicidal ideation [R45.850]   . Tobacco use disorder [F17.200] 07/20/2015  . Diabetes mellitus ([place]) [E11.9] 07/20/2015  . Cocaine use disorder, moderate, dependence ([place]) [F14.20] 04/03/2015  . Schizoaffective disorder, bipolar type ([place]) [F25.0] 04/03/2015  . Alcohol use disorder, moderate, dependence ([place]) [F10.20] 04/03/2015  . Acute ischemic stroke ([place]) [I63.9] 12/08/2014  . Left-sided weakness [M62.89] 12/08/2014  . 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 Knee pain [M25.569] 10/17/2010  . MAMMOGRAM, ABNORMAL, RIGHT [R92.8] 08/05/2010  . Dyslipidemia [E78.5] 06/21/2010  . AMENORRHEA, SECONDARY [N91.2] 10/29/2009  . 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 GERD [K21.9] 10/11/2007  . Essential hypertension [I10] 02/26/2007    Total Time spent with patient: 30 minutes  Musculoskeletal: Strength & Muscle Tone: within normal limits Gait & Station: normal Patient leans: N/A  Psychiatric Specialty Exam: Review of Systems  Psychiatric/Behavioral: Positive for substance abuse. Negative for depression and suicidal ideas.  All other systems reviewed and are negative.   Blood pressure (!) 135/97, pulse 78, temperature 98.1 F (36.7 C), temperature source Oral, resp. rate 18, height 5\' 4"  (1.626 m), weight 96.6 kg (213 lb).Body mass index is 36.56 kg/m.  General Appearance: Casual  Eye Contact::  Fair  Speech:  Clear and Coherent409  Volume:  Normal  Mood:  Euthymic  Affect:  Appropriate  Thought Process:  Goal Directed and Descriptions of Associations: Intact  Orientation:  Full (Time, Place, and Person)  Thought Content:  Logical  Suicidal Thoughts:  No  Homicidal Thoughts:  No   Memory:  Immediate;   Fair Recent;   Fair Remote;   Fair  Judgement:  Fair  Insight:  Fair  Psychomotor Activity:  Normal  Concentration:  Fair  Recall:  [employer] of Knowledge:Fair  Language: Fair  Akathisia:  No  Handed:  Right  AIMS (if indicated):   0  Assets:  Communication Skills Desire for Improvement  Sleep:  Number of Hours: 6.75  Cognition: WNL  ADL's:  Intact   Mental Status Per Nursing Assessment::   On Admission:     Demographic Factors:  Low socioeconomic status  Loss Factors: Financial problems/change in socioeconomic status  Historical Factors: Impulsivity  Risk Reduction Factors:   Positive therapeutic relationship  Continued Clinical Symptoms:  Alcohol/Substance Abuse/Dependencies Previous Psychiatric Diagnoses and Treatments Medical Diagnoses and Treatments/Surgeries  Cognitive Features That Contribute To Risk:  None    Suicide Risk:  Minimal: No identifiable suicidal ideation.  Patients presenting with no risk factors but with morbid ruminations; may be classified as minimal risk based on the severity of the depressive symptoms  Follow-up [facility] Follow up on 03/14/2016.   Why:  Patient current w this provider for ACT services.  Agency will pick you up at 1:00 the day of discharge, 9/29, and will resume services. Contact information: [place]  [address] Phone:  [phone] Fax: [phone]          Plan Of Care/Follow-up recommendations:  Activity:  no restrictions Other:  none  Caren Garske, MD 03/14/2016, 9:06 AM

## 2016-03-14 NOTE — Progress Notes (Signed)
  [facility] Adult Case Management Discharge Plan :  Will you be returning to the same living situation after discharge:  Yes,  home At discharge, do you have transportation home?: Yes,  [facility] ACT Team picking patient up at 1:00.  Do you have the ability to pay for your medications: No.  Release of information consent forms completed and in the chart;  Patient's signature needed at discharge.  Patient to Follow up at: Follow-up [place] Follow up on 03/14/2016.   Why:  Patient current w this provider for ACT services.  Agency will pick you up at 1:00 the day of discharge, 9/29, and will resume services. Contact information: [name]  [address] Phone:  [phone] Fax: [phone]          Next level of care provider has access to [place] and Suicide Prevention discussed: Yes,  with patient  Have you used any form of tobacco in the last 30 days? (Cigarettes, Smokeless Tobacco, Cigars, and/or Pipes): Yes  Has patient been referred to the Quitline?: Patient refused referral  Patient has been referred for addiction treatment: Yes  Maureen Swanson 03/14/2016, 9:52 AM

## 2016-03-14 NOTE — BHH Group Notes (Signed)
[facility] LCSW Aftercare Discharge Planning Group Note   03/14/2016 11:17 AM  Participation Quality:  Active   Mood/Affect:  Flat  Depression Rating:  8  Anxiety Rating:  8  Thoughts of Suicide:  No Will you contract for safety?   NA  Current AVH:  NA  Plan for Discharge/Comments:  Pt plans to return home and follow up with outpatient. She states her depression and anxiety have improved since admission.   Transportation Means: ACTT  Supports: None   [place] MSW, [employer]

## 2016-08-03 DIAGNOSIS — I509 Heart failure, unspecified: Secondary | ICD-10-CM | POA: Insufficient documentation

## 2016-10-20 IMAGING — CR DG CHEST 2V
2 series · 2 of 2 positions shown · non-contrast
Comparison: 11/03/2014

CLINICAL DATA: Abdominal pain, diarrhea, nausea and cough. History
of asthma.

EXAM:
CHEST - 2 VIEW

[w chest pa]
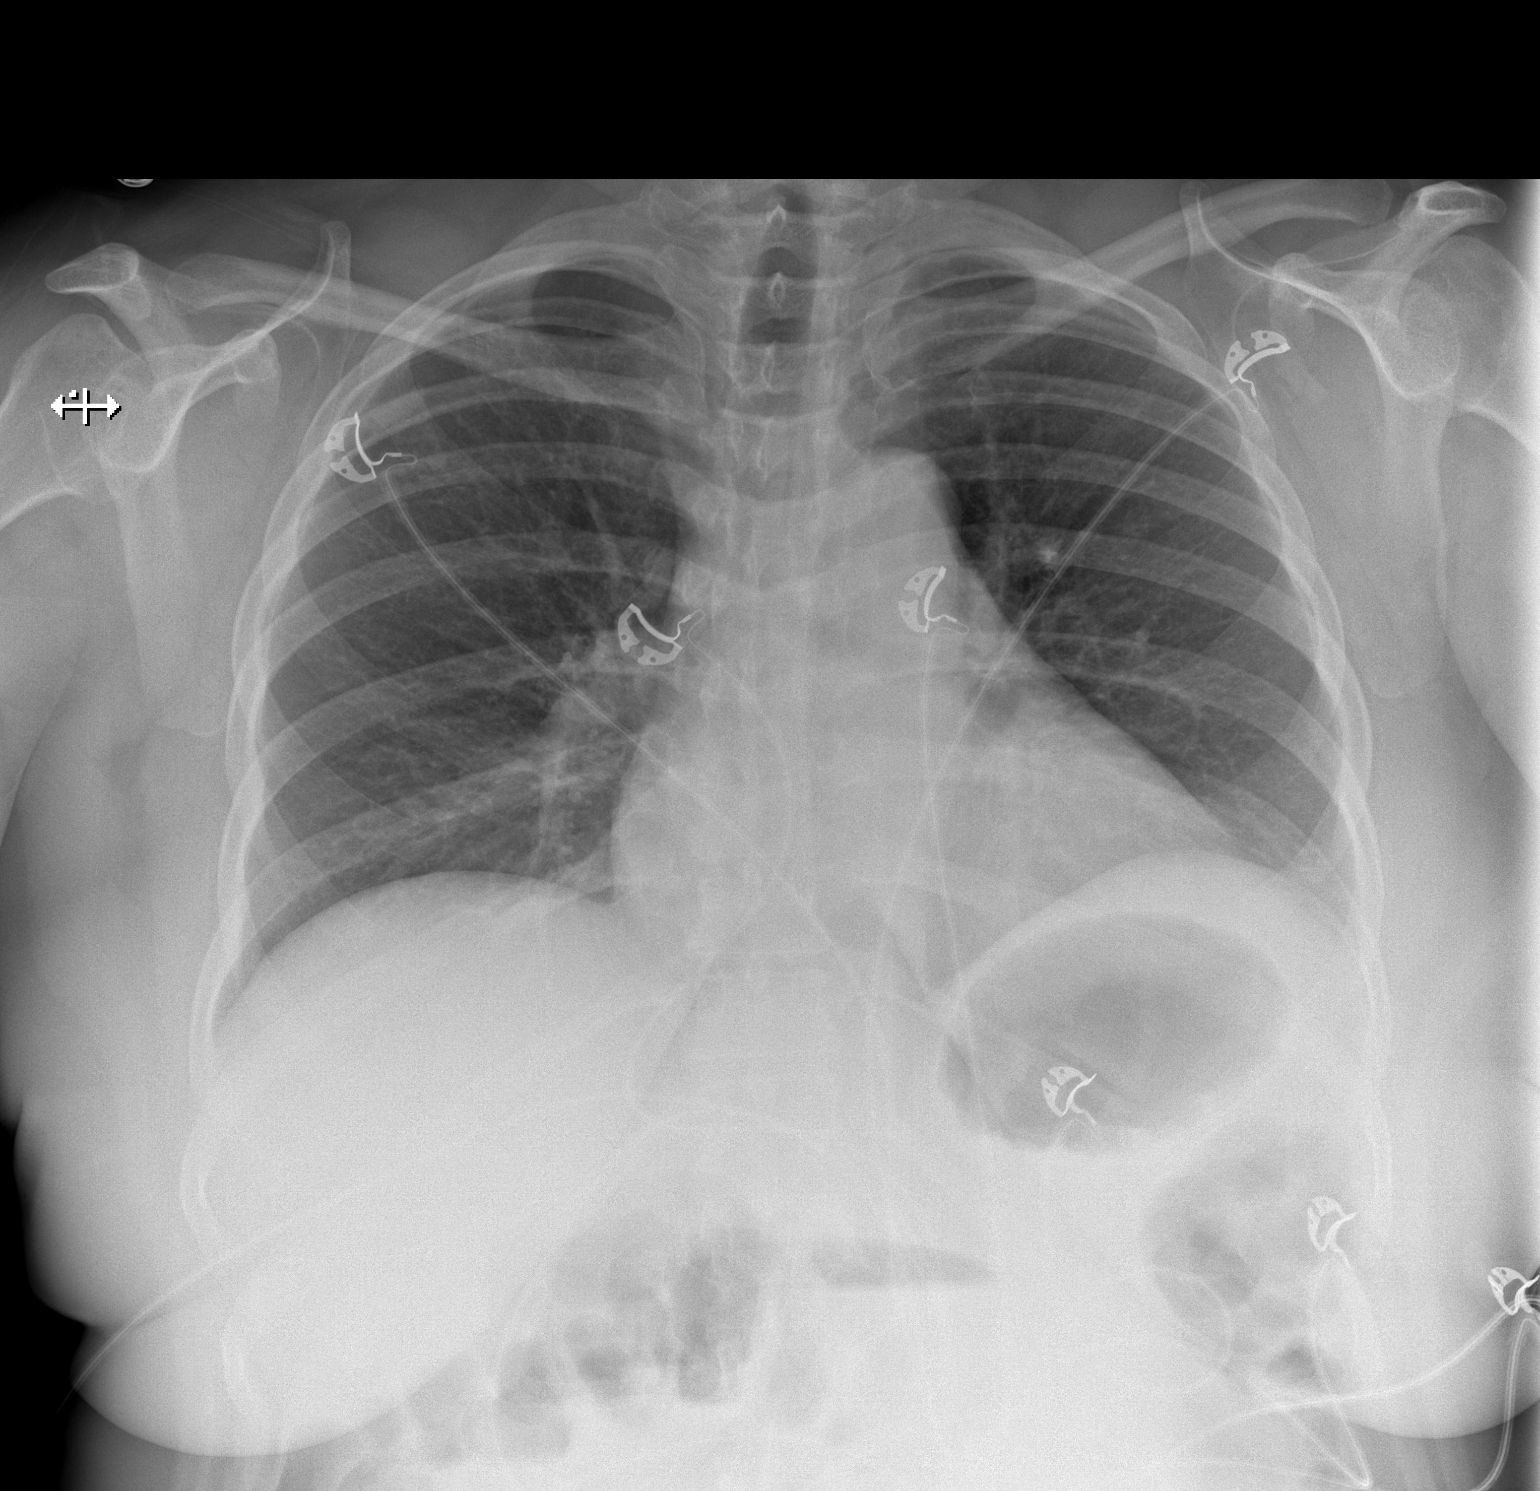

[w chest lat]
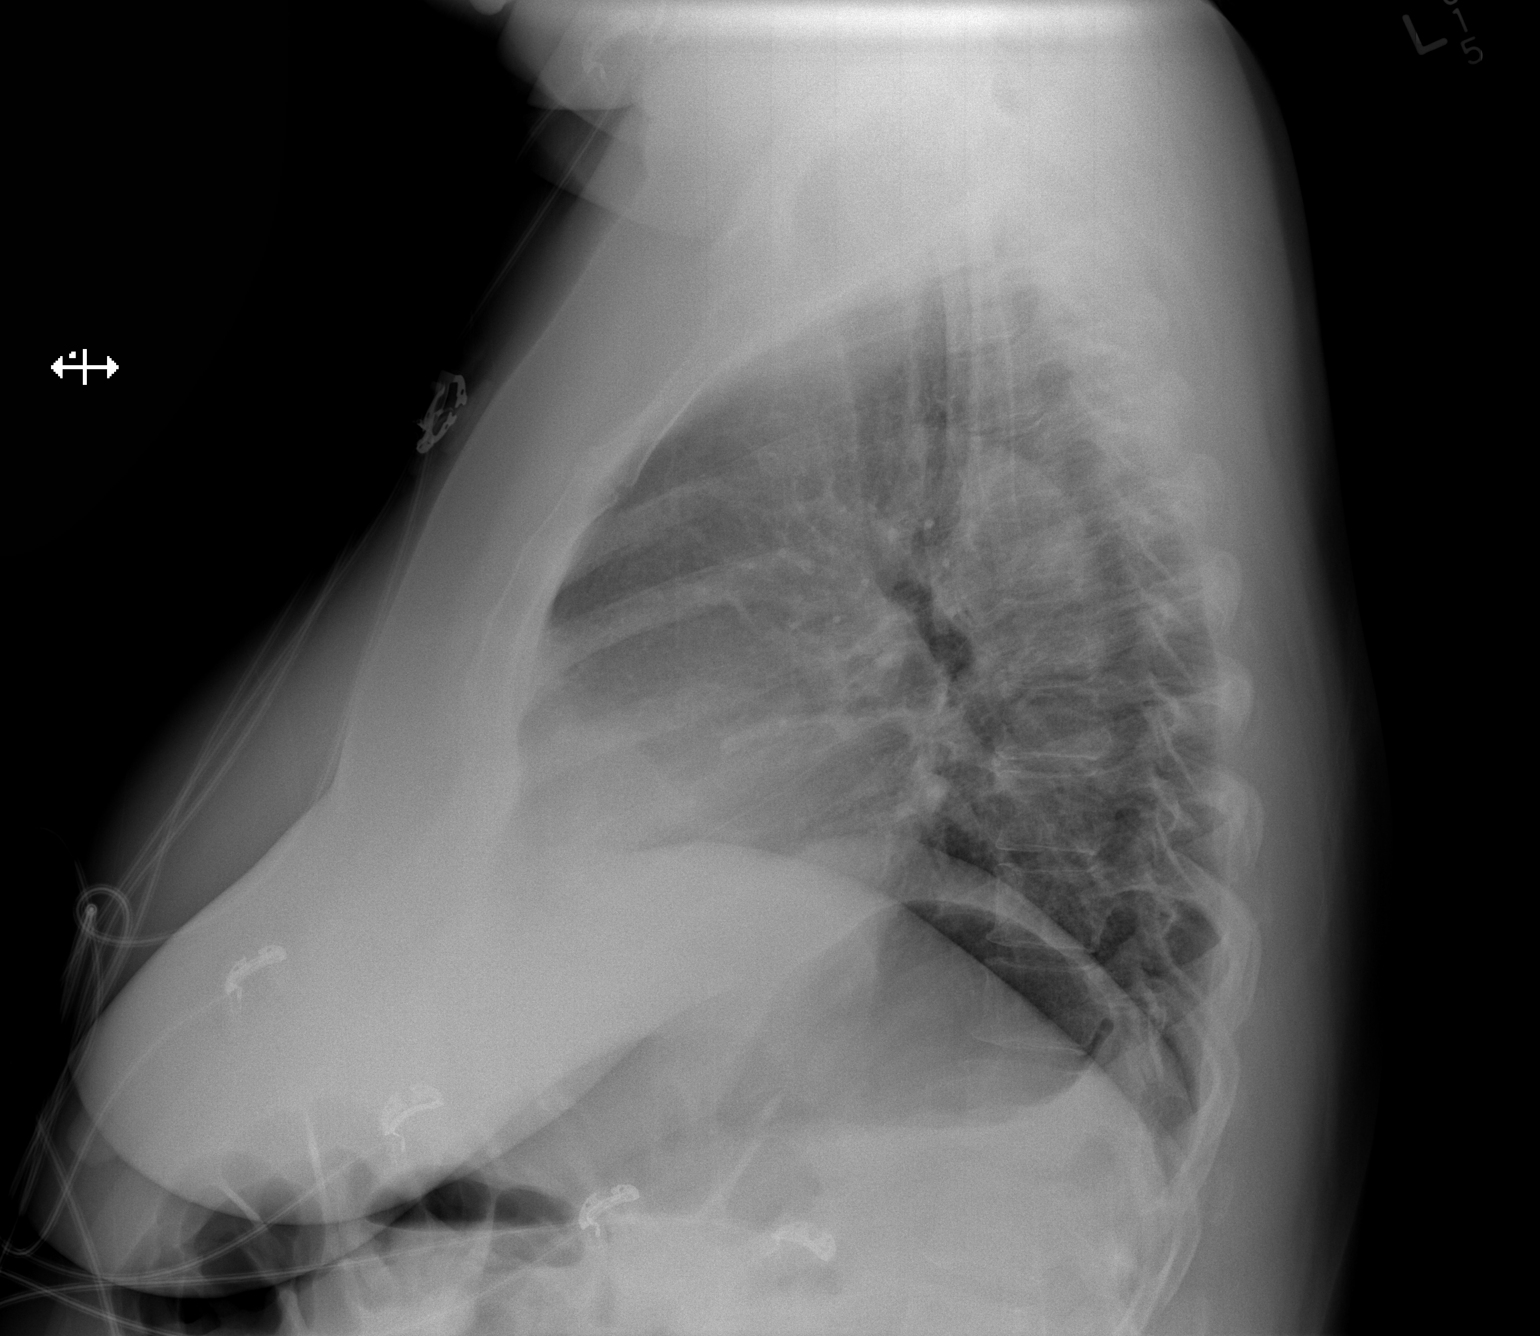

[2 of 2 positions shown; findings below may reference images not displayed]

FINDINGS: The heart size and mediastinal contours are within normal limits.
Lung volumes are low bilaterally. There is mild bibasilar
atelectasis. There is no evidence of pulmonary edema, consolidation,
pneumothorax, nodule or pleural fluid. The visualized skeletal
structures are unremarkable.
IMPRESSION: No active disease.

## 2017-02-05 IMAGING — CR DG KNEE COMPLETE 4+V*R*
4 series · 4 of 4 positions shown · non-contrast
Comparison: None.

CLINICAL DATA: Patient tripped over dog and fell. Hit knee against
the wall. Right knee. Initial encounter.

EXAM:
RIGHT KNEE - COMPLETE 4+ VIEW

[t knee ap right]
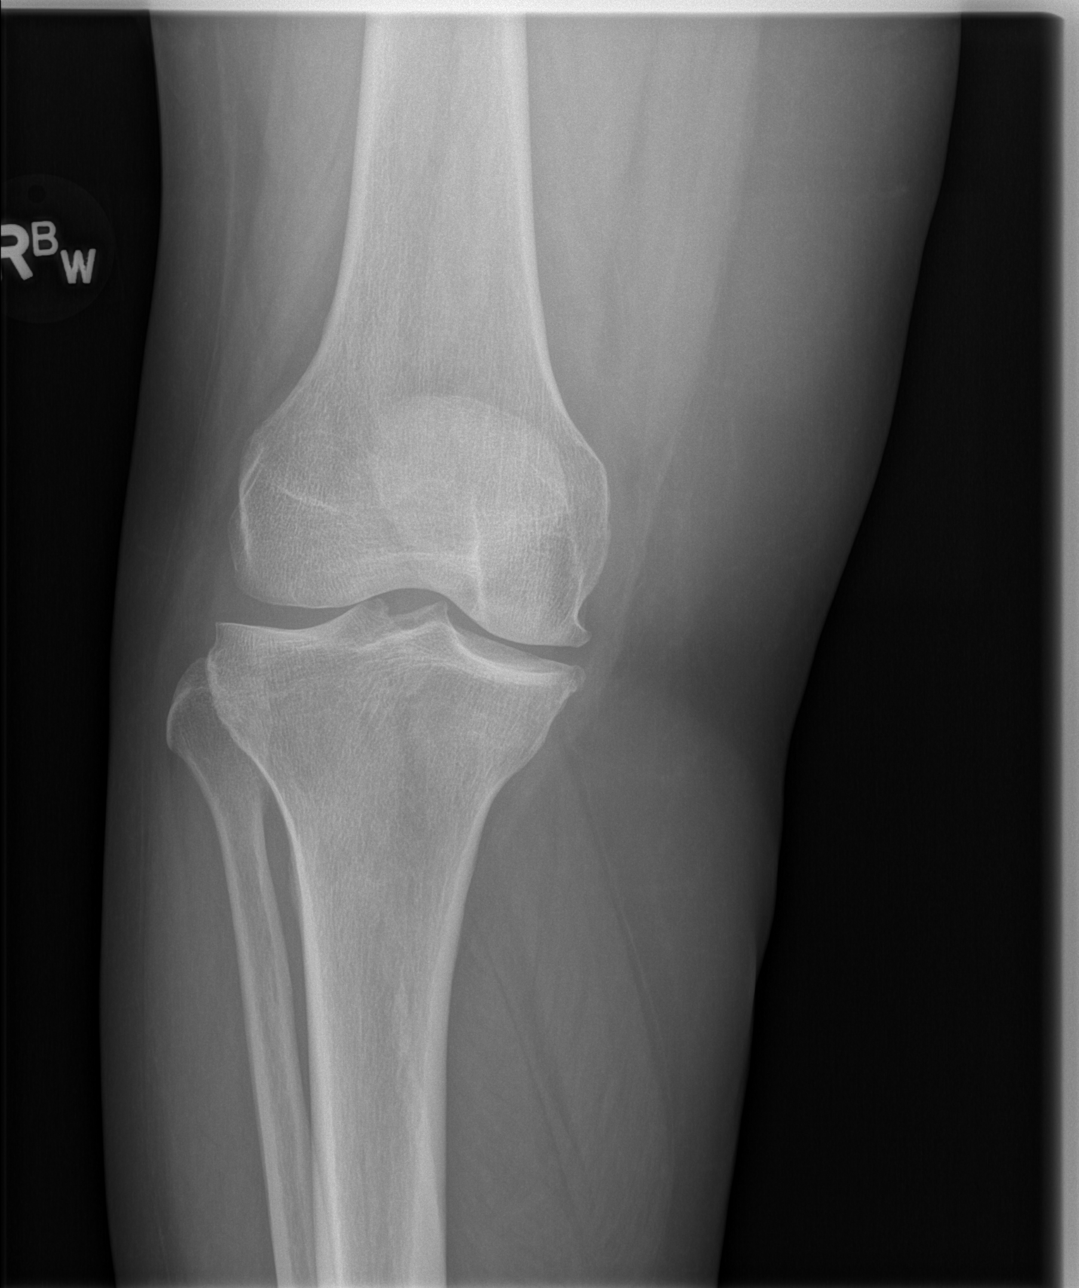

[t knee obl right (1 of 2)]
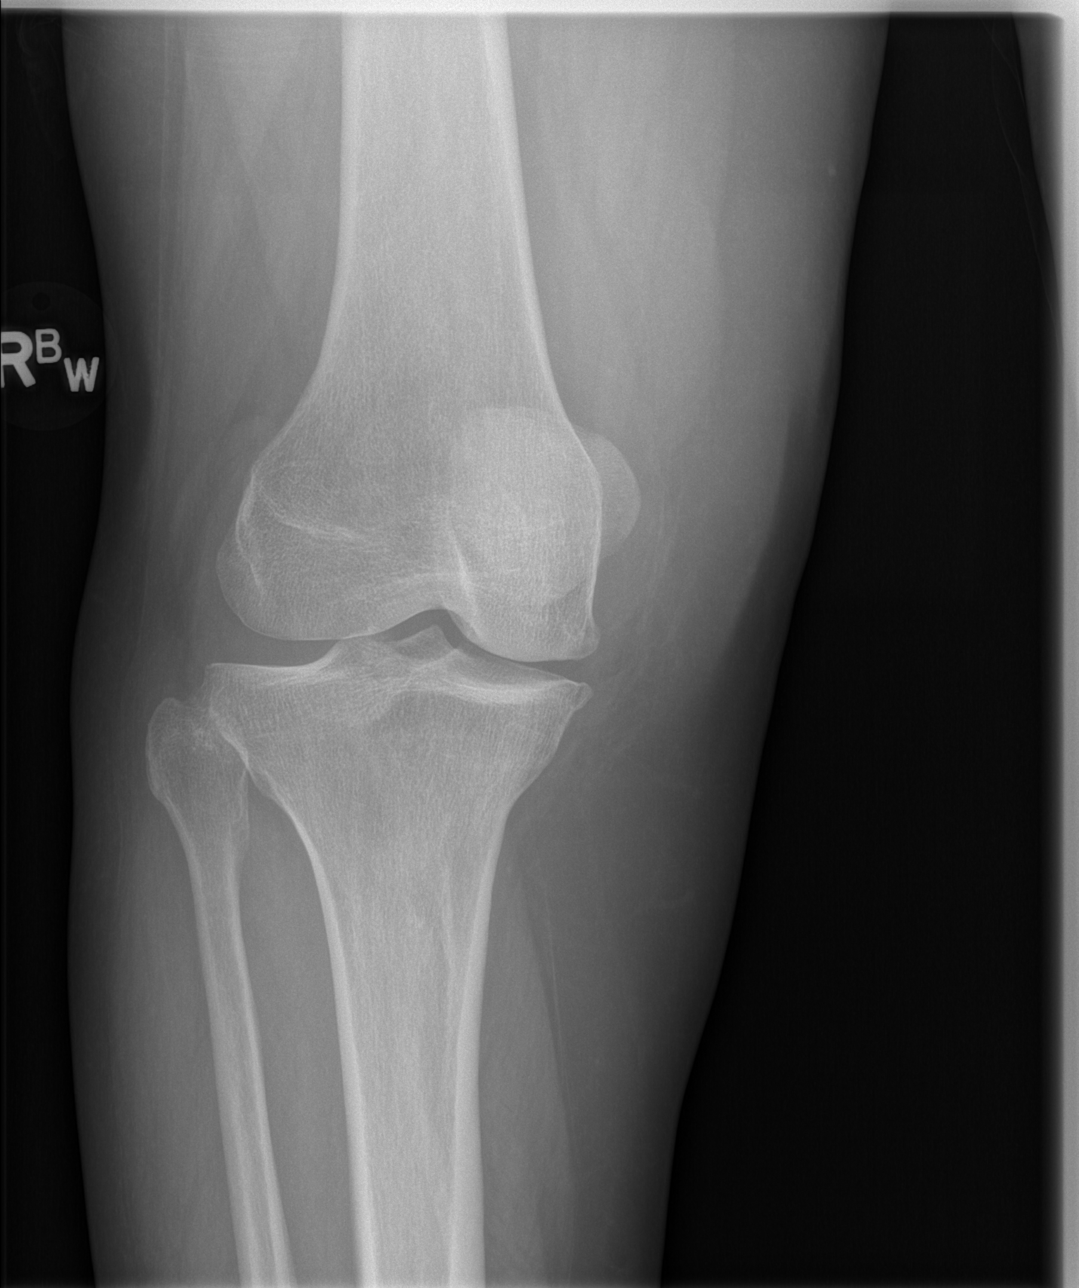

[t knee obl right (2 of 2)]
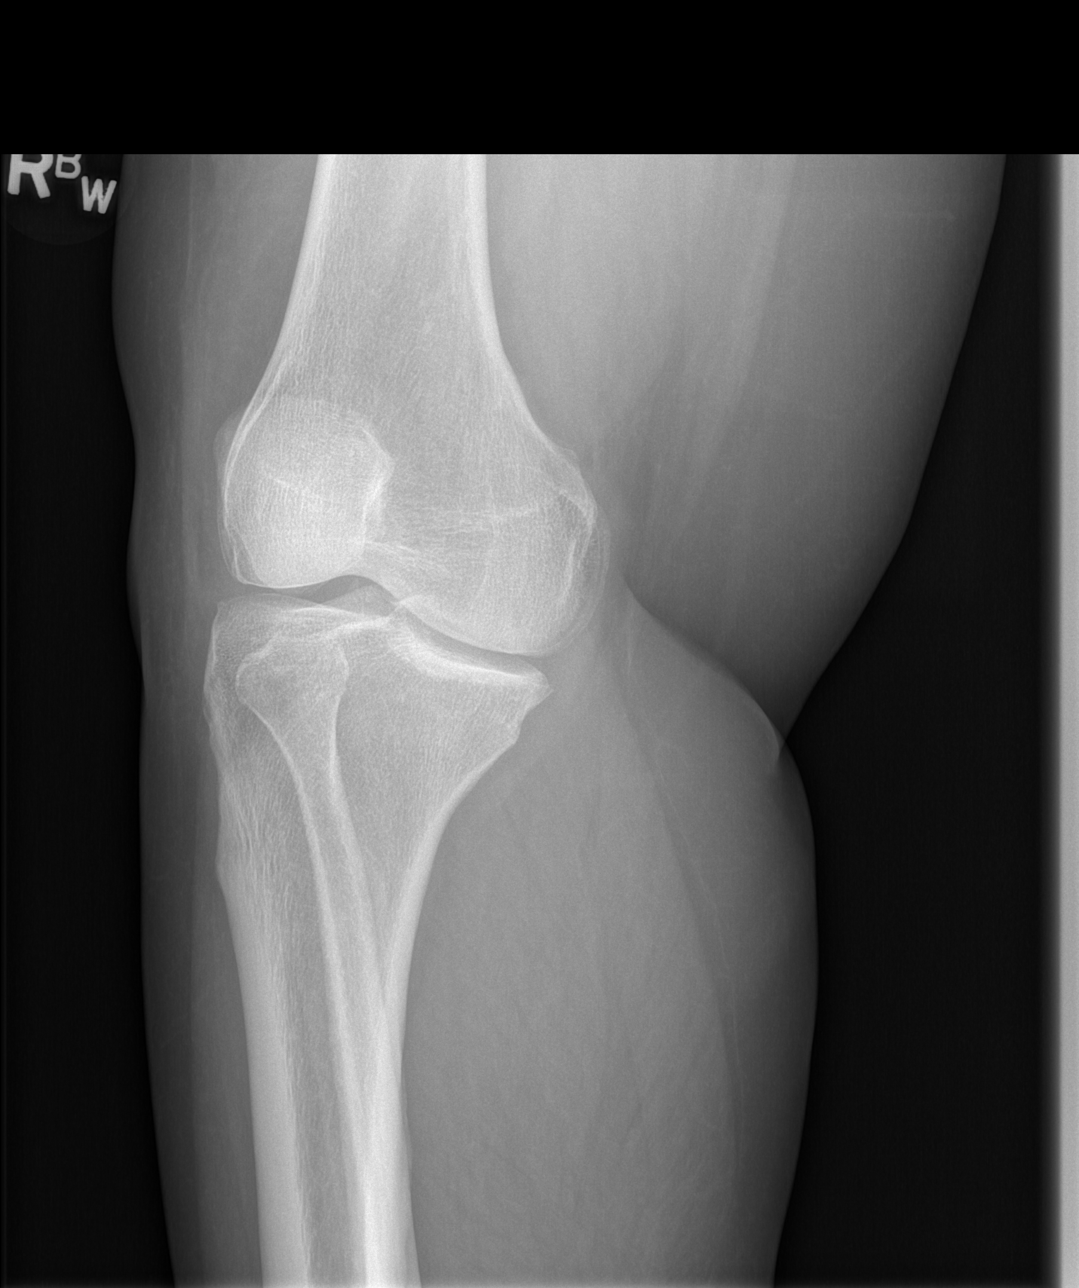

[t knee lat right]
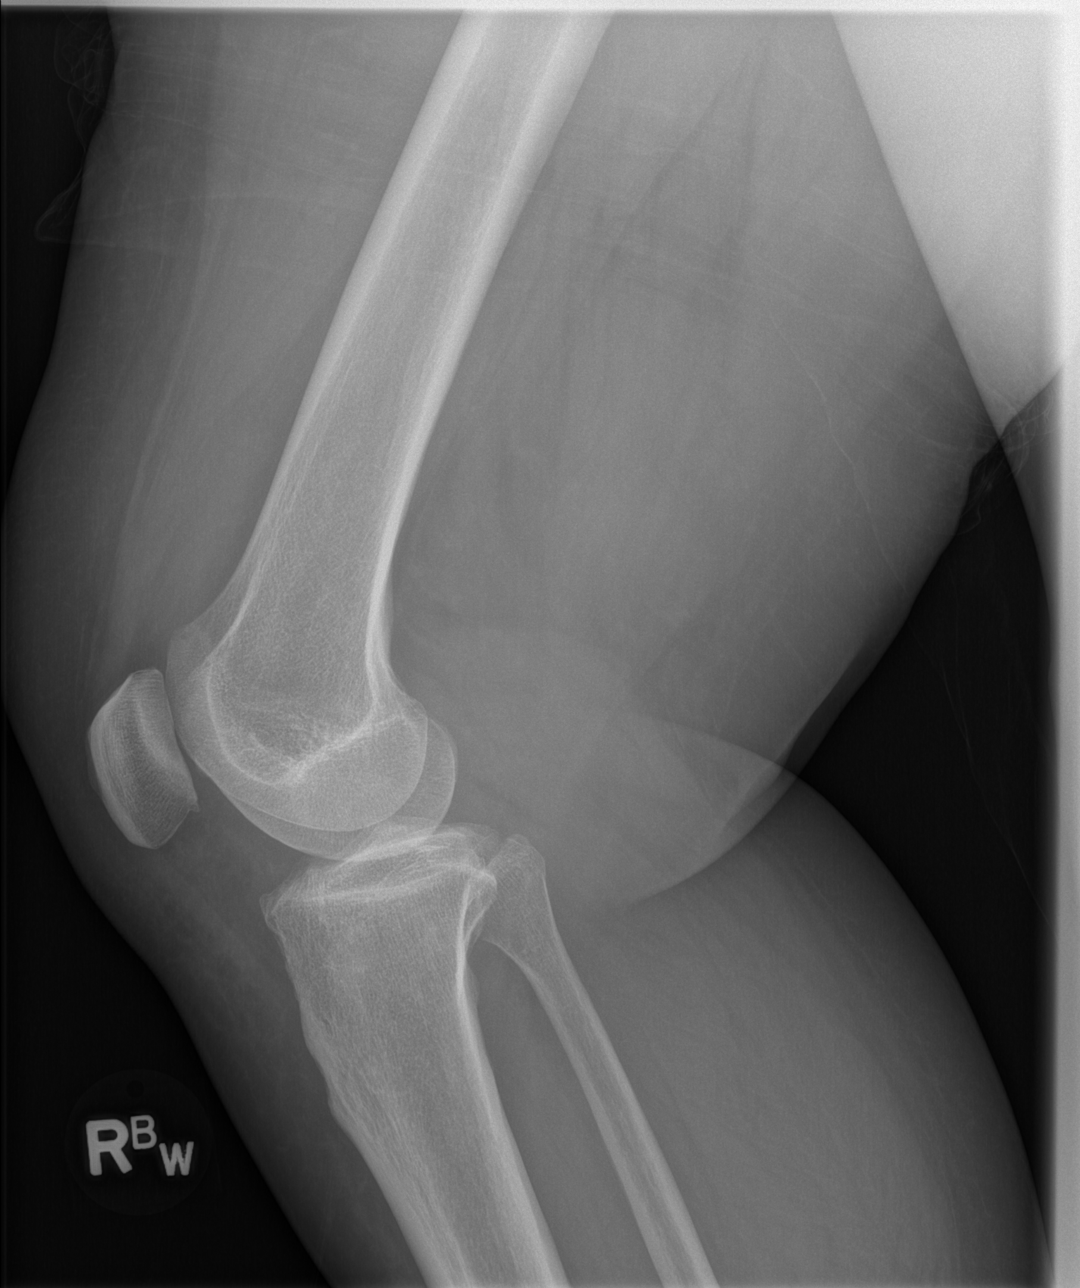

[4 of 4 positions shown; findings below may reference images not displayed]

FINDINGS: Normal anatomic alignment. Mild native compartment joint space
narrowing with osteophytosis. No evidence for acute fracture. Soft
tissues unremarkable. No joint effusion.
IMPRESSION: No acute osseous abnormality.  Mild degenerative changes.

## 2017-09-29 ENCOUNTER — Encounter: Payer: Self-pay | Admitting: Internal Medicine

## 2017-09-29 ENCOUNTER — Ambulatory Visit: Payer: Medicaid Other | Admitting: Internal Medicine

## 2017-09-29 VITALS — BP 118/80 | HR 90 | Resp 12 | Ht 63.0 in | Wt 187.0 lb

## 2017-09-29 DIAGNOSIS — M5417 Radiculopathy, lumbosacral region: Secondary | ICD-10-CM | POA: Diagnosis not present

## 2017-09-29 DIAGNOSIS — N898 Other specified noninflammatory disorders of vagina: Secondary | ICD-10-CM

## 2017-09-29 DIAGNOSIS — F1420 Cocaine dependence, uncomplicated: Secondary | ICD-10-CM

## 2017-09-29 DIAGNOSIS — Z794 Long term (current) use of insulin: Secondary | ICD-10-CM | POA: Diagnosis not present

## 2017-09-29 DIAGNOSIS — F250 Schizoaffective disorder, bipolar type: Secondary | ICD-10-CM

## 2017-09-29 DIAGNOSIS — E785 Hyperlipidemia, unspecified: Secondary | ICD-10-CM

## 2017-09-29 DIAGNOSIS — E118 Type 2 diabetes mellitus with unspecified complications: Secondary | ICD-10-CM

## 2017-09-29 DIAGNOSIS — F1020 Alcohol dependence, uncomplicated: Secondary | ICD-10-CM

## 2017-09-29 DIAGNOSIS — Z7251 High risk heterosexual behavior: Secondary | ICD-10-CM

## 2017-09-29 DIAGNOSIS — I10 Essential (primary) hypertension: Secondary | ICD-10-CM | POA: Diagnosis not present

## 2017-09-29 LAB — POCT WET PREP WITH KOH
KOH PREP POC: NEGATIVE
RBC Wet Prep HPF POC: NEGATIVE
TRICHOMONAS UA: NEGATIVE
Yeast Wet Prep HPF POC: NEGATIVE

## 2017-09-29 LAB — GLUCOSE, POCT (MANUAL RESULT ENTRY): POC Glucose: 88 mg/dl (ref 70–99)

## 2017-09-29 MED ORDER — METRONIDAZOLE 500 MG PO TABS: ORAL_TABLET | ORAL | 0 refills | Status: DC

## 2017-09-29 MED ORDER — CYCLOBENZAPRINE HCL 10 MG PO TABS: ORAL_TABLET | ORAL | 0 refills | Status: DC
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
## 2017-09-30 ENCOUNTER — Telehealth: Payer: Self-pay | Admitting: Licensed Clinical Social Worker

## 2017-09-30 NOTE — Telephone Encounter (Signed)
Social Event organiser called pt, to thank her for establishing care with the clinic. [facility] offered any other additional needs or concerns that she may have as well as counseling services. Pt decline needing any other additional services. No follow up is currently needed.

## 2017-10-01 ENCOUNTER — Other Ambulatory Visit (INDEPENDENT_AMBULATORY_CARE_PROVIDER_SITE_OTHER): Payer: Medicaid Other

## 2017-10-01 DIAGNOSIS — Z79899 Other long term (current) drug therapy: Secondary | ICD-10-CM

## 2017-10-01 DIAGNOSIS — E118 Type 2 diabetes mellitus with unspecified complications: Secondary | ICD-10-CM

## 2017-10-01 DIAGNOSIS — Z794 Long term (current) use of insulin: Secondary | ICD-10-CM | POA: Diagnosis not present

## 2017-10-01 DIAGNOSIS — Z13220 Encounter for screening for lipoid disorders: Secondary | ICD-10-CM

## 2017-10-01 DIAGNOSIS — Z7251 High risk heterosexual behavior: Secondary | ICD-10-CM

## 2017-10-01 LAB — GC/CHLAMYDIA PROBE AMP
CHLAMYDIA, DNA PROBE: NEGATIVE
Neisseria gonorrhoeae by PCR: NEGATIVE

## 2017-10-02 LAB — COMPREHENSIVE METABOLIC PANEL
ALT: 15 IU/L (ref 0–32)
AST: 18 IU/L (ref 0–40)
Albumin/Globulin Ratio: 1.6 (ref 1.2–2.2)
Albumin: 4.3 g/dL (ref 3.5–5.5)
Alkaline Phosphatase: 79 IU/L (ref 39–117)
BUN/Creatinine Ratio: 22 (ref 9–23)
BUN: 19 mg/dL (ref 6–24)
Bilirubin Total: 0.3 mg/dL (ref 0.0–1.2)
CALCIUM: 9.6 mg/dL (ref 8.7–10.2)
CO2: 27 mmol/L (ref 20–29)
CREATININE: 0.86 mg/dL (ref 0.57–1.00)
Chloride: 104 mmol/L (ref 96–106)
GFR, EST AFRICAN AMERICAN: 90 mL/min/{1.73_m2} (ref >59)
GFR, EST NON AFRICAN AMERICAN: 78 mL/min/{1.73_m2} (ref >59)
GLUCOSE: 121 mg/dL — AB (ref 65–99)
Globulin, Total: 2.7 g/dL (ref 1.5–4.5)
Potassium: 4.6 mmol/L (ref 3.5–5.2)
Sodium: 145 mmol/L — ABNORMAL HIGH (ref 134–144)
TOTAL PROTEIN: 7 g/dL (ref 6.0–8.5)

## 2017-10-02 LAB — LIPID PANEL W/O CHOL/HDL RATIO
Cholesterol, Total: 240 mg/dL — ABNORMAL HIGH (ref 100–199)
HDL: 74 mg/dL (ref >39)
LDL CALC: 151 mg/dL — AB (ref 0–99)
Triglycerides: 74 mg/dL (ref 0–149)
VLDL CHOLESTEROL CAL: 15 mg/dL (ref 5–40)

## 2017-10-02 LAB — RPR, QUANT. (REFLEX)

## 2017-10-02 LAB — HIV ANTIBODY (ROUTINE TESTING W REFLEX): HIV SCREEN 4TH GENERATION: NONREACTIVE

## 2017-10-02 LAB — HGB A1C W/O EAG: Hgb A1c MFr Bld: 5.2 % (ref 4.8–5.6)

## 2017-10-02 LAB — RPR QUALITATIVE: RPR: REACTIVE — AB

## 2017-10-05 MED ORDER — ATORVASTATIN CALCIUM 20 MG PO TABS: ORAL_TABLET | ORAL | 11 refills | Status: DC

## 2017-10-05 NOTE — Addendum Note (Signed)
Addended by: Marcelino Duster on: 10/05/2017 03:13 PM   Modules accepted: Orders

## 2017-10-05 NOTE — Progress Notes (Signed)
Patient has appointment scheduled with physical therapy for 10/08/17

## 2017-10-08 ENCOUNTER — Ambulatory Visit: Payer: Medicaid Other | Admitting: Physical Therapy

## 2017-10-19 ENCOUNTER — Ambulatory Visit: Payer: Medicaid Other

## 2017-10-22 ENCOUNTER — Ambulatory Visit: Payer: Medicaid Other | Admitting: Physical Therapy

## 2017-10-27 LAB — SPECIMEN STATUS REPORT

## 2017-10-27 LAB — FLUORESCENT TREPONEMAL AB(FTA)-IGG-BLD: Fluorescent Treponemal Ab, IgG: REACTIVE — AB

## 2017-11-04 ENCOUNTER — Encounter: Payer: Self-pay | Admitting: Internal Medicine

## 2017-11-17 ENCOUNTER — Encounter: Payer: Self-pay | Admitting: Internal Medicine

## 2017-11-17 ENCOUNTER — Other Ambulatory Visit (INDEPENDENT_AMBULATORY_CARE_PROVIDER_SITE_OTHER): Payer: Medicaid Other

## 2017-11-17 ENCOUNTER — Telehealth: Payer: Self-pay | Admitting: Internal Medicine

## 2017-11-17 DIAGNOSIS — Z79899 Other long term (current) drug therapy: Secondary | ICD-10-CM

## 2017-11-17 DIAGNOSIS — Z13220 Encounter for screening for lipoid disorders: Secondary | ICD-10-CM

## 2017-11-18 ENCOUNTER — Encounter: Payer: Self-pay | Admitting: Internal Medicine

## 2017-11-18 LAB — LIPID PANEL W/O CHOL/HDL RATIO
Cholesterol, Total: 237 mg/dL — ABNORMAL HIGH (ref 100–199)
HDL: 77 mg/dL (ref >39)
LDL CALC: 131 mg/dL — AB (ref 0–99)
Triglycerides: 143 mg/dL (ref 0–149)
VLDL Cholesterol Cal: 29 mg/dL (ref 5–40)

## 2017-11-18 LAB — HEPATIC FUNCTION PANEL
ALT: 10 IU/L (ref 0–32)
AST: 17 IU/L (ref 0–40)
Albumin: 4.3 g/dL (ref 3.5–5.5)
Alkaline Phosphatase: 80 IU/L (ref 39–117)
BILIRUBIN TOTAL: 0.2 mg/dL (ref 0.0–1.2)
BILIRUBIN, DIRECT: 0.09 mg/dL (ref 0.00–0.40)
TOTAL PROTEIN: 7.4 g/dL (ref 6.0–8.5)

## 2017-11-18 MED ORDER — METFORMIN HCL 1000 MG PO TABS
1000.0000 mg | ORAL_TABLET | Freq: Two times a day (BID) | ORAL | 11 refills | Status: DC
Start: 2017-11-18 — End: 2017-11-18

## 2017-11-18 MED ORDER — SITAGLIPTIN PHOSPHATE 100 MG PO TABS: 100.0000 mg | ORAL_TABLET | Freq: Every day | ORAL | 11 refills | Status: DC

## 2017-11-18 MED ORDER — HYDROCHLOROTHIAZIDE 12.5 MG PO TABS: 12.5000 mg | ORAL_TABLET | Freq: Every day | ORAL | 11 refills | Status: DC

## 2017-11-18 MED ORDER — AMLODIPINE BESYLATE 5 MG PO TABS: 5.0000 mg | ORAL_TABLET | Freq: Every day | ORAL | 11 refills | Status: DC

## 2017-11-18 MED ORDER — NALTREXONE HCL 50 MG PO TABS: ORAL_TABLET | ORAL | Status: DC

## 2017-11-18 MED ORDER — METFORMIN HCL 1000 MG PO TABS: 1000.0000 mg | ORAL_TABLET | Freq: Two times a day (BID) | ORAL | 11 refills | Status: DC

## 2017-11-18 MED ORDER — GLIPIZIDE 5 MG PO TABS: 5.0000 mg | ORAL_TABLET | Freq: Two times a day (BID) | ORAL | 11 refills | Status: DC

## 2017-11-18 MED ORDER — BUPROPION HCL ER (XL) 150 MG PO TB24: 150.0000 mg | ORAL_TABLET | Freq: Every day | ORAL | Status: DC

## 2017-11-18 MED ORDER — TRAZODONE HCL 150 MG PO TABS: ORAL_TABLET | ORAL | 11 refills | Status: DC

## 2017-11-18 MED ORDER — OMEPRAZOLE 20 MG PO CPDR: DELAYED_RELEASE_CAPSULE | ORAL | 11 refills | Status: DC

## 2017-11-18 MED ORDER — SERTRALINE HCL 100 MG PO TABS
100.0000 mg | ORAL_TABLET | Freq: Every day | ORAL | Status: DC
Start: 2017-11-18 — End: 2019-02-03

## 2017-11-18 MED ORDER — GABAPENTIN 600 MG PO TABS: 600.0000 mg | ORAL_TABLET | Freq: Three times a day (TID) | ORAL | 11 refills | Status: DC

## 2017-11-18 NOTE — Telephone Encounter (Signed)
Patient ultimately brought in her med list from a pharmacy in [place], [place]:  [facility] on [address] with phone [phone] and Fax [phone] she obtains through [facility]. We did not have this knowledge previously. She is taking 3 different meds for DM and 2 for Htn, not Atenolol, however. Will change her medication list and refill our meds. Psychiatry is in charge of Naltrexone, Sertraline Trazodone, and bupropion XL. Appears Dr. Natasha Bence fills these meds Called and spoke with pharmacist at length about patient's meds:  She was on the entire list for likely the past year since last July or at least since end of 2018.

## 2017-11-19 NOTE — Telephone Encounter (Signed)
Spoke with patient. Discussed medication changes. Patient verbalized understanding of decreasing Glipizide and moving towards stopping glipizide and [place]

## 2017-12-01 ENCOUNTER — Ambulatory Visit: Payer: Medicaid Other | Admitting: Internal Medicine

## 2017-12-18 ENCOUNTER — Ambulatory Visit: Payer: Medicaid Other | Admitting: Internal Medicine

## 2018-01-12 ENCOUNTER — Telehealth: Payer: Self-pay

## 2018-01-12 NOTE — Telephone Encounter (Signed)
Patient stopped in the office stating she had a meeting with her ACT team and they had changed her depression medications and she has been having issues sleeping before and after changing her meds. Patient states the ACT team thinks she needs another sleep study done. Patient states she had been diagnosed with sleep apnea in the past.   To Dr. Amil Amen to determine if patient needs aute appointment to discuss or can this wait until patient appointment on 02/17/18 where patient is scheduled for a complete physical.

## 2018-01-15 NOTE — Telephone Encounter (Signed)
Put her on the waiting list to work in if we have an opening.  Otherwise, keep her current appt in a month

## 2018-01-15 NOTE — Telephone Encounter (Signed)
Spoke with patient. she has been added to waiting list. Patient verbalized understanding

## 2018-01-18 ENCOUNTER — Ambulatory Visit: Payer: Medicaid Other | Admitting: Internal Medicine

## 2018-01-19 ENCOUNTER — Encounter: Payer: Self-pay | Admitting: Internal Medicine

## 2018-01-19 ENCOUNTER — Ambulatory Visit: Payer: Medicaid Other | Admitting: Internal Medicine

## 2018-01-19 VITALS — BP 150/98 | HR 90 | Resp 12 | Ht 63.0 in | Wt 206.0 lb

## 2018-01-19 DIAGNOSIS — E118 Type 2 diabetes mellitus with unspecified complications: Secondary | ICD-10-CM | POA: Diagnosis not present

## 2018-01-19 DIAGNOSIS — G4700 Insomnia, unspecified: Secondary | ICD-10-CM | POA: Diagnosis not present

## 2018-01-19 DIAGNOSIS — Z794 Long term (current) use of insulin: Secondary | ICD-10-CM

## 2018-01-19 DIAGNOSIS — G4733 Obstructive sleep apnea (adult) (pediatric): Secondary | ICD-10-CM | POA: Diagnosis not present

## 2018-01-19 LAB — GLUCOSE, POCT (MANUAL RESULT ENTRY): POC Glucose: 148 mg/dl — AB (ref 70–99)

## 2018-01-19 NOTE — Progress Notes (Signed)
Subjective:    Patient ID: Maureen Swanson, female    DOB: 03-27-1966, 52 y.o.   MRN: [id number]  HPI   Insomnia:  Problem for 2 months.  Takes Trazodone 300 mg at bedtime.  Started Melatonin last week 3 mg at bedtime.  Does not recall any medication changes through [facility] prior to this starting.  Her Wellbutrin, however, was stopped at some point since last seen in April.    Goes to bed 8:30 - 9 p.m nightly.  Takes about 40 minutes for her to fall asleep.  Turns on TV when she cannot fall asleep.  Falls asleep with TV on.  Awakens until midnight, then awakens again and cannot fall asleep until 3 a.m.  Sleeps until 5-6 a.m.  Able to get back to sleep at some point and then awakens at 8-8:30 a.m. The TV is going the entire night and into the morning.  No napping during the day. Watches TV, goes walking, spends time at her son's home during the day.   Walks for about 20 minutes daily.  Could walk longer.   Drinks espresso late morning/early afternoon.  Large cup.   Eats her last meal at 7 p.m and watches TV in her living room Takes her bedtime meds and then goes to bed.   Later, patient states she was switched from Trazodone to Remeron in June and as did not seem to make a difference with sleep, they moved her back to Trazodone.  Diagnosed with severe OSA 3 years ago.  Never set up on CPAP--states could not afford to get it.  Looking back at her study from 2016, was diagnosed with severe OSA.  She has not lost weight since the study.  States sometimes she wakes gasping for breath.    Current Meds  Medication Sig  . amLODipine (NORVASC) 5 MG tablet Take 1 tablet (5 mg total) by mouth daily.  Marland Kitchen aspirin EC 81 MG tablet Take by mouth.  Marland Kitchen atorvastatin (LIPITOR) 20 MG tablet 1 tab by mouth daily with evening meal  . cyclobenzaprine (FLEXERIL) 10 MG tablet 1/2  To 1 tab by mouth every 8 hours as needed for back pain  . gabapentin (NEURONTIN) 600 MG tablet Take 1 tablet (600 mg total) by mouth  3 (three) times daily.  Marland Kitchen glipiZIDE (GLUCOTROL) 5 MG tablet Take 1 tablet (5 mg total) by mouth 2 (two) times daily before a meal.  . hydrochlorothiazide (HYDRODIURIL) 12.5 MG tablet Take 1 tablet (12.5 mg total) by mouth daily.  Marland Kitchen ibuprofen (ADVIL,MOTRIN) 800 MG tablet Take 800 mg by mouth every 8 (eight) hours as needed.  . Melatonin 3 MG TABS Take by mouth. 1 tablet at bedtime  . metFORMIN (GLUCOPHAGE) 1000 MG tablet Take 1 tablet (1,000 mg total) by mouth 2 (two) times daily with a meal.  . Multiple Vitamin (MULTIVITAMIN WITH MINERALS) TABS tablet Take 1 tablet by mouth daily.  . naltrexone (DEPADE) 50 MG tablet 2 tabs by mouth once daily  . omeprazole (PRILOSEC) 20 MG capsule 1 cap by mouth on empty stomach 1/2 hour before breakfast daily  . sertraline (ZOLOFT) 100 MG tablet Take 1 tablet (100 mg total) by mouth daily.  . sitaGLIPtin (JANUVIA) 100 MG tablet Take 1 tablet (100 mg total) by mouth daily.  . traZODone (DESYREL) 150 MG tablet 1-2 tabs by mouth at bedtime as needed for sleep    Allergies  Allergen Reactions  . Chocolate Hives  . Orange Hives    "  Acid foods"  . Penicillins Hives    Has patient had a PCN reaction causing immediate rash, facial/tongue/throat swelling, SOB or lightheadedness with hypotension: Yes Has patient had a PCN reaction causing severe rash involving mucus membranes or skin necrosis: Yes Has patient had a PCN reaction that required hospitalization No Has patient had a PCN reaction occurring within the last 10 years: No If all of the above answers are "NO", then may proceed with Cephalosporin use.   . Other Swelling    strawberries  . Tomato Hives    "acid foods"          Review of Systems     Objective:   Physical Exam NAD Lungs: CTA CV:  RRR without murmur or rub.  Radial pulses normal and equal.       Assessment & Plan:  1.  Insomnia:  Discussed at length ways to improve sleep.  Not clear if a medication change was made prior to  this complaint. Encouraged no daytime napping, which she states she is not doing. Decrease Espresso intake. Regular physical activity, preferably outside to improve sun exposure. Regular calming routine at bedtime. Up after 20 minutes if cannot fall asleep or if awakens and cannot resume sleep.  Go read.  She will go to [employer] for books to read.   No video with TV or phone/computer.  Get TV/phone out of bedroom No games or busy work. Back to bed when sleepy.  2.  OSA:  Referral to [facility] for CPAP set up now patient with coverage to obtain.

## 2018-01-19 NOTE — Patient Instructions (Signed)
No TV when cannot fall asleep Regular daily physical activity outside if possible Read if you cannot fall or get back to sleep in 20 minutes

## 2018-01-25 ENCOUNTER — Ambulatory Visit: Payer: Medicaid Other | Admitting: Internal Medicine

## 2018-02-17 ENCOUNTER — Ambulatory Visit: Payer: Medicaid Other | Admitting: Internal Medicine

## 2018-03-02 ENCOUNTER — Ambulatory Visit: Payer: Medicaid Other | Admitting: Internal Medicine

## 2018-03-24 ENCOUNTER — Ambulatory Visit: Payer: Medicaid Other | Admitting: Internal Medicine

## 2018-03-26 ENCOUNTER — Encounter: Payer: Self-pay | Admitting: Internal Medicine

## 2018-04-23 ENCOUNTER — Other Ambulatory Visit (HOSPITAL_BASED_OUTPATIENT_CLINIC_OR_DEPARTMENT_OTHER): Payer: Self-pay

## 2018-04-23 DIAGNOSIS — G4733 Obstructive sleep apnea (adult) (pediatric): Secondary | ICD-10-CM

## 2018-04-30 ENCOUNTER — Ambulatory Visit: Payer: Medicaid Other | Admitting: Internal Medicine

## 2018-05-11 ENCOUNTER — Telehealth: Payer: Self-pay | Admitting: Internal Medicine

## 2018-05-11 NOTE — Telephone Encounter (Signed)
Mental Health Nurse Practicioner of [facility]..  Nurse Practicioner name Margarita Grizzle, contact number [phone].  Ms. Wire blood pressure running high: 177/113 on 05/11/2018 03/23/2018 was 158/99

## 2018-05-12 MED ORDER — AMLODIPINE BESYLATE 10 MG PO TABS: 10.0000 mg | ORAL_TABLET | Freq: Every day | ORAL | 11 refills | Status: DC

## 2018-05-12 NOTE — Telephone Encounter (Signed)
Appears she is taking her medication as gets weekly in a bubble pack.  Has been elevated mildly in last 2 years.

## 2018-05-12 NOTE — Telephone Encounter (Signed)
Increased dose of Amlodipine to 10 mg daily.

## 2018-05-23 ENCOUNTER — Encounter (HOSPITAL_BASED_OUTPATIENT_CLINIC_OR_DEPARTMENT_OTHER): Payer: Self-pay

## 2018-06-04 ENCOUNTER — Ambulatory Visit: Payer: Medicaid Other | Admitting: Internal Medicine

## 2018-06-04 ENCOUNTER — Encounter (HOSPITAL_BASED_OUTPATIENT_CLINIC_OR_DEPARTMENT_OTHER): Payer: Self-pay

## 2018-06-30 ENCOUNTER — Ambulatory Visit: Payer: Medicaid Other | Admitting: Internal Medicine

## 2018-07-06 ENCOUNTER — Ambulatory Visit: Payer: Medicaid Other | Admitting: Internal Medicine

## 2018-07-15 ENCOUNTER — Ambulatory Visit (HOSPITAL_BASED_OUTPATIENT_CLINIC_OR_DEPARTMENT_OTHER): Payer: Medicaid Other | Attending: Internal Medicine

## 2018-07-23 ENCOUNTER — Ambulatory Visit: Payer: Medicaid Other | Admitting: Internal Medicine

## 2018-08-03 ENCOUNTER — Encounter: Payer: Self-pay | Admitting: Internal Medicine

## 2018-08-03 ENCOUNTER — Ambulatory Visit (INDEPENDENT_AMBULATORY_CARE_PROVIDER_SITE_OTHER): Payer: Medicaid Other | Admitting: Internal Medicine

## 2018-08-03 VITALS — BP 152/98 | HR 94

## 2018-08-03 DIAGNOSIS — M6789 Other specified disorders of synovium and tendon, multiple sites: Secondary | ICD-10-CM

## 2018-08-03 DIAGNOSIS — M5417 Radiculopathy, lumbosacral region: Secondary | ICD-10-CM | POA: Diagnosis not present

## 2018-08-03 DIAGNOSIS — I10 Essential (primary) hypertension: Secondary | ICD-10-CM | POA: Diagnosis not present

## 2018-08-03 DIAGNOSIS — Z794 Long term (current) use of insulin: Secondary | ICD-10-CM

## 2018-08-03 DIAGNOSIS — R251 Tremor, unspecified: Secondary | ICD-10-CM

## 2018-08-03 DIAGNOSIS — B353 Tinea pedis: Secondary | ICD-10-CM

## 2018-08-03 DIAGNOSIS — E118 Type 2 diabetes mellitus with unspecified complications: Secondary | ICD-10-CM

## 2018-08-03 DIAGNOSIS — E785 Hyperlipidemia, unspecified: Secondary | ICD-10-CM

## 2018-08-03 MED ORDER — ACCU-CHEK SOFTCLIX LANCETS MISC: 12 refills | Status: DC

## 2018-08-03 MED ORDER — ARIPIPRAZOLE ER 300 MG IM SRER: 300.0000 mg | INTRAMUSCULAR | Status: DC

## 2018-08-03 MED ORDER — GABAPENTIN 100 MG PO CAPS
100.0000 mg | ORAL_CAPSULE | Freq: Three times a day (TID) | ORAL | 11 refills | Status: DC
Start: 2018-08-03 — End: 2019-02-03

## 2018-08-03 MED ORDER — GLUCOSE BLOOD VI STRP: ORAL_STRIP | 12 refills | Status: DC

## 2018-08-03 MED ORDER — TERBINAFINE HCL 1 % EX CREA: TOPICAL_CREAM | CUTANEOUS | 2 refills | Status: DC

## 2018-08-03 MED ORDER — MELOXICAM 15 MG PO TABS: ORAL_TABLET | ORAL | 2 refills | Status: DC

## 2018-08-03 MED ORDER — TEA TREE OIL EX OIL: TOPICAL_OIL | CUTANEOUS | Status: DC

## 2018-08-03 MED ORDER — ACCU-CHEK AVIVA PLUS W/DEVICE KIT: PACK | 0 refills | Status: DC

## 2018-08-03 MED ORDER — ACCU-CHEK SOFTCLIX LANCET DEV KIT: PACK | 0 refills | Status: DC

## 2018-08-03 NOTE — Progress Notes (Signed)
Subjective:    Patient ID: Maureen Swanson, female   DOB: 17-May-1966, 53 y.o.   MRN: [id number]   HPI   1.  DM:  Not taking meds as felt she was doing well.  Has not been checking sugars as she lost her glucometer. Her A1C was 5.2% back in April.   She would like to stop taking Januvia and Glipizide and just take Metformin and see what happens.  2.  Hypertension:  Feeling fine, so not taking meds.  3.  Back Pain:  See note from 09/2017.  She refused PT, but did not share with me.  States she had PT three times in past and made her back pain worse. She continues to have a lot of bilateral lumbar back pain.  Has radicular pain down anterior right thigh with this pain.  Pain is constant.  Worse if lies or sits for long periods of time.   She is taking Ibuprofen 400 mg every 2-6 hours. Patient with history of Disc protrusion at L5-S1 with mass effect on both S1 nerve root sleeves. Also with foraminal disc protrusions bilaterally at L4-5 without neural impingement and slight narrowing of the spinal canal.   She received what sounds like epidural injections with [facility].  Has multiple injections with mild relief.   Has been to ortho, possibly [facility] for this in the past.   She does not recall the physician She cannot recall when she last took Gabapentin.  Did notice her back pain worsened when she stopped.    4.  GERD:  Staying away from the foods that exacerbate.  Not taking Omeprazole.  5.  Depression/Schizoaffective Disorder:  Was having visual hallucinations (animals or people running by) before getting injection of Abilify.  On unknown medication-not Cogentin, but may be Amantadine- to treat tremors she has developed she states she has had in past month. [facility]  6.  Has been clean of alcohol and drugs.  Naltrexone was discontinued.  7.  Left foot with a knot that hurts all the time, but worse when bears weight. Current Meds    Medication Sig  . aspirin EC 81 MG tablet Take by mouth.  . hydrochlorothiazide (HYDRODIURIL) 12.5 MG tablet Take 1 tablet (12.5 mg total) by mouth daily.  Marland Kitchen ibuprofen (ADVIL,MOTRIN) 800 MG tablet Take 800 mg by mouth every 8 (eight) hours as needed.  . Melatonin 3 MG TABS Take by mouth. 1 tablet at bedtime  . Multiple Vitamin (MULTIVITAMIN WITH MINERALS) TABS tablet Take 1 tablet by mouth daily.  . traZODone (DESYREL) 150 MG tablet 1-2 tabs by mouth at bedtime as needed for sleep   Allergies  Allergen Reactions  . Chocolate Hives  . Orange Hives    "Acid foods"  . Penicillins Hives    Has patient had a PCN reaction causing immediate rash, facial/tongue/throat swelling, SOB or lightheadedness with hypotension: Yes Has patient had a PCN reaction causing severe rash involving mucus membranes or skin necrosis: Yes Has patient had a PCN reaction that required hospitalization No Has patient had a PCN reaction occurring within the last 10 years: No If all of the above answers are "NO", then may proceed with Cephalosporin use.   . Other Swelling    strawberries  . Tomato Hives    "acid foods"     Review of Systems    Objective:   BP (!) 152/98 (BP Location: Left Arm, Patient Position: Sitting, Cuff Size: Normal)  Pulse 94   Physical Exam  NAD HEENT:  PERRL, EOMI, throat without injection Neck:  Supple, No adenopathy Chest:  CTA CV:  RRR without murmur or rub.  Radial and DP pulses normal and equal. Abd:  S, NT, No HSM or mass, + BS Neuro:  A & O x 3, CN  2-12 grossly intact.  Both resting and intention tremor of left hand.  Resting tremor mainly of right hand.   No cogwheeling. DTRs 2+/4 throughout.   Gait normal Sensory to light touch grossly normal. MS:  Left great toe flexor tendon swelling/mass about mid foot.  Tender to palpation.  No fluctuance Generalized flaking of plantar feet. Assessment & Plan  1.  DM:  Fine with treatment only with Metformin.   Follow up in  3 months with A1C and urine microalbumin/crea to evaluate that treatment alone.  2.  Hypertension:  Discussed many people with hypertension are asymptomatic until they suffer effects of damage to vascular system with stroke or MI or renal failure.   Encouraged her to never stop medication on her own.   To get back to Amlodipine.  3.  Tremors of UE:  Not clear what her antipsychotic medication is currently.   Waiting for her to call in her psychotropic meds before deciding what to do about tremor.   4.  Lumbosacral back pain and flexor tendon swelling and pain of left great toe:   Ortho referral.  Meloxicam 15 mg daily. Restart Gabapentin.  5.  Tinea pedis:  Terbinafine cream twice daily for 14 days or until clears.   Tea Tree oil mixed with [place] Foot cream daily for long term foot care.

## 2018-08-18 ENCOUNTER — Ambulatory Visit (INDEPENDENT_AMBULATORY_CARE_PROVIDER_SITE_OTHER): Payer: Self-pay | Admitting: Orthopaedic Surgery

## 2018-08-31 ENCOUNTER — Telehealth: Payer: Self-pay | Admitting: Internal Medicine

## 2018-08-31 NOTE — Telephone Encounter (Signed)
Mickel Baas (nurse practitioner) called while pt. Was at a mental health visit Pt. States she has been having intermittent chest pain that lasts for about 5 minutes at a time and  started two weeks ago . Last time she had the chest pain was this morning at around 9:30 am. Mickel Baas advise to schedule pt. To come for an EKG.  Pt. Informed nurse was going to call her to triage

## 2018-08-31 NOTE — Telephone Encounter (Signed)
Spoke with patient and per Dr. Amil Amen if patient is having intermittent chest pain she needs to be evaluated at the ER. Patient verbalized understanding.

## 2018-09-07 ENCOUNTER — Ambulatory Visit (INDEPENDENT_AMBULATORY_CARE_PROVIDER_SITE_OTHER): Payer: Self-pay | Admitting: Orthopaedic Surgery

## 2018-09-08 ENCOUNTER — Telehealth (INDEPENDENT_AMBULATORY_CARE_PROVIDER_SITE_OTHER): Payer: Self-pay | Admitting: Radiology

## 2018-09-08 ENCOUNTER — Ambulatory Visit: Payer: Medicaid Other | Admitting: Internal Medicine

## 2018-09-08 ENCOUNTER — Telehealth (INDEPENDENT_AMBULATORY_CARE_PROVIDER_SITE_OTHER): Payer: Self-pay | Admitting: Orthopaedic Surgery

## 2018-09-08 NOTE — Telephone Encounter (Signed)
Left message to r/s 09/17/2018 appointment with Dr. Lorin Mercy to before 2pm

## 2018-09-08 NOTE — Telephone Encounter (Signed)
FYI:  Pt called and r/s appt before 2

## 2018-09-17 ENCOUNTER — Ambulatory Visit (INDEPENDENT_AMBULATORY_CARE_PROVIDER_SITE_OTHER): Payer: Self-pay | Admitting: Orthopaedic Surgery

## 2018-09-29 ENCOUNTER — Other Ambulatory Visit: Payer: Self-pay

## 2018-09-29 ENCOUNTER — Ambulatory Visit (INDEPENDENT_AMBULATORY_CARE_PROVIDER_SITE_OTHER): Payer: Medicaid Other

## 2018-09-29 ENCOUNTER — Encounter (INDEPENDENT_AMBULATORY_CARE_PROVIDER_SITE_OTHER): Payer: Self-pay | Admitting: Orthopaedic Surgery

## 2018-09-29 ENCOUNTER — Ambulatory Visit (INDEPENDENT_AMBULATORY_CARE_PROVIDER_SITE_OTHER): Payer: Medicaid Other | Admitting: Orthopaedic Surgery

## 2018-09-29 VITALS — Ht 64.0 in | Wt 205.0 lb

## 2018-09-29 DIAGNOSIS — M545 Low back pain, unspecified: Secondary | ICD-10-CM

## 2018-09-29 DIAGNOSIS — M79672 Pain in left foot: Secondary | ICD-10-CM | POA: Diagnosis not present

## 2018-09-29 DIAGNOSIS — G8929 Other chronic pain: Secondary | ICD-10-CM

## 2018-09-29 NOTE — Progress Notes (Signed)
Office Visit Note   Patient: Maureen Swanson           Date of Birth: Jan 20, 1966           MRN: [id number] Visit Date: 09/29/2018              Requested by: Mack Hook, MD [place], [place] [id number] PCP: Mack Hook, MD   Assessment & Plan: Visit Diagnoses:  1. Chronic right-sided low back pain, unspecified whether sciatica present   2. Pain in left foot     Plan: We will set patient up for some physical therapy recheck her in a month we discussed walking program weight loss.  We reviewed MRI scan and discussed her disc degeneration and disc protrusions.  We will see her back after the therapy and will defer treatment of the plantar fibroma at this point.  Recheck 1 month.  Follow-Up Instructions: No follow-ups on file.   Orders:  Orders Placed This Encounter  Procedures   XR Lumbar Spine 2-3 Views   XR Foot Complete Left   No orders of the defined types were placed in this encounter.     Procedures: No procedures performed   Clinical Data: No additional findings.   Subjective: Chief Complaint  Patient presents with   Lower Back - Pain   Left Foot - Pain    HPI 53 year old female seen with low back pain back and right leg x5 to 6 months.  Pain radiates down past her knee she denies numbness or tingling no bowel bladder symptoms.  Patient states she is taken Tylenol but not had any relief.  Patient states she has to move her left arm a lot also had problems with her right leg and has to move it frequently she has difficulty sleeping.  She is also noticed gradual development of a knot over the plantar surface of her foot that bothers her with some shoes.  Past history of foraminal disc protrusion bilaterally L4-5 without nerve compression.  Disc protrusion L5-S1 with mass-effect on both nerves.  Small protrusion L3-4.  Review of Systems is systems positive for previous CVA positive for GERD hypertension, diabetes history of homicidal  ideations, cannabis use alcohol disorder schizophrenia bipolar, cocaine use disorder.   Objective: Vital Signs: Ht 5\' 4"  (1.626 m)    Wt 205 lb (93 kg)    BMI 35.19 kg/m   Physical Exam Constitutional:      Appearance: She is well-developed.  HENT:     Head: Normocephalic.     Right Ear: External ear normal.     Left Ear: External ear normal.  Eyes:     Pupils: Pupils are equal, round, and reactive to light.  Neck:     Thyroid: No thyromegaly.     Trachea: No tracheal deviation.  Cardiovascular:     Rate and Rhythm: Normal rate.  Pulmonary:     Effort: Pulmonary effort is normal.  Abdominal:     Palpations: Abdomen is soft.  Skin:    General: Skin is warm and dry.  Neurological:     Mental Status: She is alert and oriented to person, place, and time.  Psychiatric:        Behavior: Behavior normal.     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  She has some mild tenderness.  Mild sciatic notch tenderness no trochanteric bursal tenderness.  Knee and ankle jerk are intact.  Distal pulses are  intact.  Knees reach full extension good quad strength.  Specialty Comments:  No specialty comments available.  Imaging: No results found.   PMFS History: Patient Active Problem List   Diagnosis Date Noted   Hypomagnesemia    Cannabis use disorder, moderate, dependence ([place]) 11/20/2015   Homicidal ideation    Tobacco use disorder 07/20/2015   Diabetes mellitus ([place]) 07/20/2015   Cocaine use disorder, moderate, dependence ([place]) 04/03/2015   Schizoaffective disorder, bipolar type ([place]) 04/03/2015   Alcohol use disorder, moderate, dependence ([place]) 04/03/2015   Acute ischemic stroke ([place]) 12/08/2014   Left-sided weakness 12/08/2014   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   Knee pain 10/17/2010   MAMMOGRAM, ABNORMAL, RIGHT 08/05/2010   Dyslipidemia 06/21/2010   AMENORRHEA, SECONDARY 10/29/2009   HERPES ZOSTER  08/20/2009   GERD 10/11/2007   Essential hypertension 02/26/2007   Past Medical History:  Diagnosis Date   Anxiety    Arthritis    back and knees   Asthma    daily and prn inhalers   Atypical ductal hyperplasia of breast 03/2012   right   Bipolar 1 disorder ([place])    CHF (congestive heart failure) ([place])    Depression    Diabetes mellitus    diet-controlled   Gastric ulcer    GERD (gastroesophageal reflux disease)    Gout    Headache(784.0)    migraines   High cholesterol    Hypertension    under control, has been on med. x 12 yrs.   Substance abuse ([place])    crack cocaine:  extensive treatment with [facility] from 2014 to 2018 for this and alcohol abuse.   TMJ (temporomandibular joint disorder)     Family History  Problem Relation Age of Onset   Diabetes Mother    Breast cancer Mother    Cancer Father    Bipolar disorder Maternal Aunt    Schizophrenia Maternal Grandfather    Alcoholism Maternal Uncle     Past Surgical History:  Procedure Laterality Date   BREAST LUMPECTOMY WITH NEEDLE LOCALIZATION  04/19/2012   Procedure: BREAST LUMPECTOMY WITH NEEDLE LOCALIZATION;  Surgeon: Luella Cook III, MD;  Location: [facility];  Service: General;  Laterality: Right;   KNEE ARTHROSCOPY W/ PARTIAL MEDIAL MENISCECTOMY  05/01/2010   right   Social History   Occupational History   Not on file  Tobacco Use   Smoking status: Former Smoker    Packs/day: 1.00    Years: 0.00    Pack years: 0.00    Types: Cigarettes   Smokeless tobacco: Never Used  Substance and Sexual Activity   Alcohol use: Yes    Comment: reports she drinks three 40 ounce beers daily   Drug use: Yes    Frequency: 5.0 times per week    Types: Cocaine, Benzodiazepines    Comment: 02/28/16   Sexual activity: Never    Birth control/protection: Post-menopausal

## 2018-10-07 ENCOUNTER — Other Ambulatory Visit: Payer: Self-pay | Admitting: Internal Medicine

## 2018-10-28 ENCOUNTER — Other Ambulatory Visit: Payer: Self-pay | Admitting: Internal Medicine

## 2018-10-29 ENCOUNTER — Ambulatory Visit: Payer: Self-pay | Admitting: Orthopaedic Surgery

## 2018-11-01 ENCOUNTER — Other Ambulatory Visit: Payer: Medicaid Other

## 2018-11-03 ENCOUNTER — Ambulatory Visit: Payer: Medicaid Other | Admitting: Orthopaedic Surgery

## 2018-11-05 ENCOUNTER — Ambulatory Visit: Payer: Medicaid Other | Admitting: Internal Medicine

## 2018-11-15 ENCOUNTER — Ambulatory Visit: Payer: Medicaid Other | Attending: Orthopaedic Surgery | Admitting: Physical Therapy

## 2018-11-19 ENCOUNTER — Ambulatory Visit: Payer: Medicaid Other | Admitting: Orthopaedic Surgery

## 2018-11-23 ENCOUNTER — Ambulatory Visit: Payer: Medicaid Other | Admitting: Physical Therapy

## 2018-12-07 ENCOUNTER — Ambulatory Visit: Payer: Medicaid Other

## 2018-12-16 ENCOUNTER — Other Ambulatory Visit (INDEPENDENT_AMBULATORY_CARE_PROVIDER_SITE_OTHER): Payer: Medicaid Other

## 2018-12-16 ENCOUNTER — Other Ambulatory Visit: Payer: Self-pay

## 2018-12-16 DIAGNOSIS — Z794 Long term (current) use of insulin: Secondary | ICD-10-CM | POA: Diagnosis not present

## 2018-12-16 DIAGNOSIS — Z79899 Other long term (current) drug therapy: Secondary | ICD-10-CM

## 2018-12-16 DIAGNOSIS — E118 Type 2 diabetes mellitus with unspecified complications: Secondary | ICD-10-CM | POA: Diagnosis not present

## 2018-12-16 DIAGNOSIS — E785 Hyperlipidemia, unspecified: Secondary | ICD-10-CM

## 2018-12-17 LAB — COMPREHENSIVE METABOLIC PANEL
ALT: 9 IU/L (ref 0–32)
AST: 12 IU/L (ref 0–40)
Albumin/Globulin Ratio: 1.5 (ref 1.2–2.2)
Albumin: 4.1 g/dL (ref 3.8–4.9)
Alkaline Phosphatase: 75 IU/L (ref 39–117)
BUN/Creatinine Ratio: 16 (ref 9–23)
BUN: 21 mg/dL (ref 6–24)
Bilirubin Total: 0.3 mg/dL (ref 0.0–1.2)
CO2: 23 mmol/L (ref 20–29)
Calcium: 9.1 mg/dL (ref 8.7–10.2)
Chloride: 106 mmol/L (ref 96–106)
Creatinine, Ser: 1.34 mg/dL — ABNORMAL HIGH (ref 0.57–1.00)
GFR calc Af Amer: 52 mL/min/{1.73_m2} — ABNORMAL LOW (ref >59)
GFR calc non Af Amer: 45 mL/min/{1.73_m2} — ABNORMAL LOW (ref >59)
Globulin, Total: 2.7 g/dL (ref 1.5–4.5)
Glucose: 126 mg/dL — ABNORMAL HIGH (ref 65–99)
Potassium: 4.3 mmol/L (ref 3.5–5.2)
Sodium: 143 mmol/L (ref 134–144)
Total Protein: 6.8 g/dL (ref 6.0–8.5)

## 2018-12-17 LAB — CBC WITH DIFFERENTIAL/PLATELET
Basophils Absolute: 0 10*3/uL (ref 0.0–0.2)
Basos: 0 %
EOS (ABSOLUTE): 0.1 10*3/uL (ref 0.0–0.4)
Eos: 1 %
Hematocrit: 39.3 % (ref 34.0–46.6)
Hemoglobin: 12.3 g/dL (ref 11.1–15.9)
Immature Grans (Abs): 0 10*3/uL (ref 0.0–0.1)
Immature Granulocytes: 0 %
Lymphocytes Absolute: 1.5 10*3/uL (ref 0.7–3.1)
Lymphs: 20 %
MCH: 29.3 pg (ref 26.6–33.0)
MCHC: 31.3 g/dL — ABNORMAL LOW (ref 31.5–35.7)
MCV: 94 fL (ref 79–97)
Monocytes Absolute: 0.4 10*3/uL (ref 0.1–0.9)
Monocytes: 5 %
Neutrophils Absolute: 5.5 10*3/uL (ref 1.4–7.0)
Neutrophils: 74 %
Platelets: 205 10*3/uL (ref 150–450)
RBC: 4.2 x10E6/uL (ref 3.77–5.28)
RDW: 11.8 % (ref 11.7–15.4)
WBC: 7.6 10*3/uL (ref 3.4–10.8)

## 2018-12-17 LAB — HGB A1C W/O EAG: Hgb A1c MFr Bld: 5.8 % — ABNORMAL HIGH (ref 4.8–5.6)

## 2018-12-17 LAB — LIPID PANEL W/O CHOL/HDL RATIO
Cholesterol, Total: 217 mg/dL — ABNORMAL HIGH (ref 100–199)
HDL: 73 mg/dL (ref >39)
LDL Calculated: 118 mg/dL — ABNORMAL HIGH (ref 0–99)
Triglycerides: 132 mg/dL (ref 0–149)
VLDL Cholesterol Cal: 26 mg/dL (ref 5–40)

## 2018-12-20 ENCOUNTER — Other Ambulatory Visit: Payer: Medicaid Other

## 2018-12-24 ENCOUNTER — Ambulatory Visit: Payer: Medicaid Other | Admitting: Internal Medicine

## 2018-12-28 ENCOUNTER — Ambulatory Visit: Payer: Medicaid Other | Admitting: Orthopaedic Surgery

## 2019-01-03 MED ORDER — ATORVASTATIN CALCIUM 40 MG PO TABS: ORAL_TABLET | ORAL | 11 refills | Status: DC

## 2019-01-03 NOTE — Addendum Note (Signed)
Addended by: Marcelino Duster on: 01/03/2019 09:31 AM   Modules accepted: Orders

## 2019-01-06 ENCOUNTER — Encounter: Payer: Self-pay | Admitting: Internal Medicine

## 2019-02-03 ENCOUNTER — Emergency Department (HOSPITAL_COMMUNITY)
Admission: EM | Admit: 2019-02-03 | Discharge: 2019-02-04 | Disposition: A | Discharge status: Other Acute Inpatient Hospital | Payer: Medicaid Other | Attending: Physical Medicine & Rehabilitation | Admitting: Physical Medicine & Rehabilitation

## 2019-02-03 ENCOUNTER — Other Ambulatory Visit: Payer: Self-pay

## 2019-02-03 ENCOUNTER — Emergency Department (HOSPITAL_COMMUNITY): Payer: Medicaid Other

## 2019-02-03 ENCOUNTER — Encounter (HOSPITAL_COMMUNITY): Payer: Self-pay | Admitting: Emergency Medicine

## 2019-02-03 DIAGNOSIS — F319 Bipolar disorder, unspecified: Secondary | ICD-10-CM | POA: Diagnosis not present

## 2019-02-03 DIAGNOSIS — Z87891 Personal history of nicotine dependence: Secondary | ICD-10-CM | POA: Diagnosis not present

## 2019-02-03 DIAGNOSIS — Y999 Unspecified external cause status: Secondary | ICD-10-CM | POA: Diagnosis not present

## 2019-02-03 DIAGNOSIS — Y929 Unspecified place or not applicable: Secondary | ICD-10-CM | POA: Diagnosis not present

## 2019-02-03 DIAGNOSIS — R300 Dysuria: Secondary | ICD-10-CM | POA: Diagnosis not present

## 2019-02-03 DIAGNOSIS — I10 Essential (primary) hypertension: Secondary | ICD-10-CM | POA: Insufficient documentation

## 2019-02-03 DIAGNOSIS — E119 Type 2 diabetes mellitus without complications: Secondary | ICD-10-CM | POA: Insufficient documentation

## 2019-02-03 DIAGNOSIS — Y939 Activity, unspecified: Secondary | ICD-10-CM | POA: Diagnosis not present

## 2019-02-03 DIAGNOSIS — R45851 Suicidal ideations: Secondary | ICD-10-CM | POA: Insufficient documentation

## 2019-02-03 DIAGNOSIS — X58XXXA Exposure to other specified factors, initial encounter: Secondary | ICD-10-CM | POA: Insufficient documentation

## 2019-02-03 DIAGNOSIS — S22089A Unspecified fracture of T11-T12 vertebra, initial encounter for closed fracture: Secondary | ICD-10-CM | POA: Diagnosis not present

## 2019-02-03 DIAGNOSIS — Z20828 Contact with and (suspected) exposure to other viral communicable diseases: Secondary | ICD-10-CM | POA: Insufficient documentation

## 2019-02-03 DIAGNOSIS — R1032 Left lower quadrant pain: Secondary | ICD-10-CM | POA: Diagnosis present

## 2019-02-03 LAB — URINALYSIS, ROUTINE W REFLEX MICROSCOPIC
Bilirubin Urine: NEGATIVE
Glucose, UA: NEGATIVE mg/dL
Hgb urine dipstick: NEGATIVE
Ketones, ur: NEGATIVE mg/dL
Leukocytes,Ua: NEGATIVE
Nitrite: NEGATIVE
Protein, ur: NEGATIVE mg/dL
Specific Gravity, Urine: 1.026 (ref 1.005–1.030)
pH: 5 (ref 5.0–8.0)

## 2019-02-03 LAB — CBC WITH DIFFERENTIAL/PLATELET
Abs Immature Granulocytes: 0.02 10*3/uL (ref 0.00–0.07)
Basophils Absolute: 0 10*3/uL (ref 0.0–0.1)
Basophils Relative: 0 %
Eosinophils Absolute: 0 10*3/uL (ref 0.0–0.5)
Eosinophils Relative: 1 %
HCT: 40 % (ref 36.0–46.0)
Hemoglobin: 12.5 g/dL (ref 12.0–15.0)
Immature Granulocytes: 0 %
Lymphocytes Relative: 19 %
Lymphs Abs: 1.3 10*3/uL (ref 0.7–4.0)
MCH: 29 pg (ref 26.0–34.0)
MCHC: 31.3 g/dL (ref 30.0–36.0)
MCV: 92.8 fL (ref 80.0–100.0)
Monocytes Absolute: 0.4 10*3/uL (ref 0.1–1.0)
Monocytes Relative: 6 %
Neutro Abs: 4.9 10*3/uL (ref 1.7–7.7)
Neutrophils Relative %: 74 %
Platelets: 218 10*3/uL (ref 150–400)
RBC: 4.31 MIL/uL (ref 3.87–5.11)
RDW: 12.4 % (ref 11.5–15.5)
WBC: 6.6 10*3/uL (ref 4.0–10.5)
nRBC: 0 % (ref 0.0–0.2)

## 2019-02-03 LAB — COMPREHENSIVE METABOLIC PANEL
ALT: 13 U/L (ref 0–44)
AST: 17 U/L (ref 15–41)
Albumin: 3.8 g/dL (ref 3.5–5.0)
Alkaline Phosphatase: 53 U/L (ref 38–126)
Anion gap: 10 (ref 5–15)
BUN: 21 mg/dL — ABNORMAL HIGH (ref 6–20)
CO2: 23 mmol/L (ref 22–32)
Calcium: 9.3 mg/dL (ref 8.9–10.3)
Chloride: 104 mmol/L (ref 98–111)
Creatinine, Ser: 1.19 mg/dL — ABNORMAL HIGH (ref 0.44–1.00)
GFR calc Af Amer: >60 mL/min (ref >60)
GFR calc non Af Amer: 52 mL/min — ABNORMAL LOW (ref >60)
Glucose, Bld: 112 mg/dL — ABNORMAL HIGH (ref 70–99)
Potassium: 4.2 mmol/L (ref 3.5–5.1)
Sodium: 137 mmol/L (ref 135–145)
Total Bilirubin: 0.6 mg/dL (ref 0.3–1.2)
Total Protein: 7.2 g/dL (ref 6.5–8.1)

## 2019-02-03 LAB — SARS CORONAVIRUS 2 BY RT PCR (HOSPITAL ORDER, PERFORMED IN ~~LOC~~ HOSPITAL LAB): SARS Coronavirus 2: NEGATIVE

## 2019-02-03 MED ORDER — KETOROLAC TROMETHAMINE 30 MG/ML IJ SOLN
30.0000 mg | Freq: Once | INTRAMUSCULAR | Status: AC
  Administered 2019-02-03: 30 mg via INTRAMUSCULAR
  Filled 2019-02-03: qty 1

## 2019-02-03 MED ORDER — FAMOTIDINE 20 MG PO TABS
20.0000 mg | ORAL_TABLET | Freq: Once | ORAL | Status: AC
  Administered 2019-02-03: 20 mg via ORAL
  Filled 2019-02-03: qty 1

## 2019-02-03 MED ORDER — ONDANSETRON 4 MG PO TBDP
4.0000 mg | ORAL_TABLET | Freq: Once | ORAL | Status: AC
  Administered 2019-02-03: 4 mg via ORAL
  Filled 2019-02-03: qty 1

## 2019-02-03 NOTE — ED Notes (Signed)
Patient transported to CT 

## 2019-02-03 NOTE — ED Notes (Signed)
Pt back from CT

## 2019-02-03 NOTE — ED Notes (Signed)
Pt reports that she wants to be evaluated at [facility] to get started back on her psych medications. Pt states she stopped taking them about 2 weeks ago because she was depressed.

## 2019-02-03 NOTE — ED Notes (Signed)
Pt wanded by security. 

## 2019-02-03 NOTE — ED Provider Notes (Signed)
[facility] EMERGENCY DEPARTMENT Provider Note   CSN: [id number] Arrival date & time: 02/03/19  1615     History   Chief Complaint Chief Complaint  Patient presents with   Dysuria   Flank Pain   Suicidal    HPI Maureen Swanson is a 53 y.o. female.     HPI   Pt is a 53 y/o female with a h/o asthma, bipolar, CHF, depression, diabetes, GERD, hyperlipidemia, hypertension, substance abuse, who presents to the ED today for eval of left flank pain. States that it has been present for the last 3 months. States pain is worse when she turns over in bed. Pain is constant. States pain worsened today which is why she came to the ED. Describes pain as aching. Pain rated 9/10. Pain radiates to the LLQ of the abdomen. She has tried advil without significant relief.   She reports dysuria, frequency for about 1-2 months. She denies associated fevers, vomiting, constipation, vaginal discharge, vaginal bleeding, or hematuria.  She does reports diarrhea x2 weeks that has been intermittent and occurs every few days.    States she is sexually active and last had intercourse last night. She used protection.  She states that she is not concerned for sexually transmitted infections.  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 x 12 yrs.   Substance abuse ([place])    crack cocaine:  extensive treatment with [facility] from 2014 to 2018 for this and alcohol abuse.   TMJ (temporomandibular joint disorder)     Patient  Active Problem List   Diagnosis Date Noted   Hypomagnesemia    Cannabis use disorder, moderate, dependence ([place]) 11/20/2015   Homicidal ideation    Tobacco use disorder 07/20/2015   Diabetes mellitus ([place]) 07/20/2015   Cocaine use disorder, moderate, dependence ([place]) 04/03/2015   Schizoaffective disorder, bipolar type ([place]) 04/03/2015   Alcohol use disorder, moderate, dependence ([place]) 04/03/2015   Acute ischemic stroke ([place]) 12/08/2014   Left-sided weakness 12/08/2014   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   Knee pain 10/17/2010   MAMMOGRAM, ABNORMAL, RIGHT 08/05/2010   Dyslipidemia 06/21/2010   AMENORRHEA, SECONDARY 10/29/2009   HERPES ZOSTER 08/20/2009   GERD 10/11/2007   Essential hypertension 02/26/2007    Past Surgical History:  Procedure Laterality Date   BREAST LUMPECTOMY WITH NEEDLE LOCALIZATION  04/19/2012   Procedure: BREAST LUMPECTOMY WITH NEEDLE LOCALIZATION;  Surgeon: [name], MD;  Location: [facility];  Service: General;  Laterality: Right;   KNEE ARTHROSCOPY W/ PARTIAL MEDIAL MENISCECTOMY  05/01/2010   right     OB History    Gravida  3   Para  2   Term  2   Preterm      AB  1   Living  2     SAB  1   TAB  Ectopic      Multiple      Live Births               Home Medications    Prior to Admission medications   Medication Sig Start Date End Date Taking? Authorizing Provider  ACCU-CHEK SOFTCLIX LANCETS lancets Check blood glucose twice daily before meals 08/03/18  Yes Mack Hook, MD  glucose blood (ACCU-CHEK AVIVA PLUS) test strip Check blood glucose twice daily before meals 08/03/18  Yes Mack Hook, MD  Blood Glucose Monitoring Suppl (ACCU-CHEK AVIVA PLUS) w/Device KIT Check blood glucose twice daily before meals 08/03/18   Mack Hook, MD    Family History Family History  Problem Relation Age of Onset   Diabetes Mother    Breast cancer Mother     Cancer Father    Bipolar disorder Maternal Aunt    Schizophrenia Maternal Grandfather    Alcoholism Maternal Uncle     Social History Social History   Tobacco Use   Smoking status: Former Smoker    Packs/day: 1.00    Years: 0.00    Pack years: 0.00    Types: Cigarettes   Smokeless tobacco: Never Used  Substance Use Topics   Alcohol use: Yes    Comment: reports she drinks three 40 ounce beers daily   Drug use: Yes    Frequency: 5.0 times per week    Types: Cocaine, Benzodiazepines    Comment: 02/28/16     Allergies   Chocolate, Orange, Penicillins, Other, and Tomato   Review of Systems Review of Systems  Constitutional: Negative for chills and fever.  HENT: Negative for ear pain and sore throat.   Eyes: Negative for visual disturbance.  Respiratory: Negative for cough and shortness of breath.   Cardiovascular: Negative for chest pain.  Gastrointestinal: Positive for abdominal pain, diarrhea and nausea. Negative for constipation and vomiting.  Genitourinary: Positive for dysuria, flank pain and frequency. Negative for hematuria, vaginal bleeding and vaginal discharge.  Musculoskeletal: Negative for back pain.  Skin: Negative for rash.  Neurological: Negative for headaches.  All other systems reviewed and are negative.   Physical Exam Updated Vital Signs BP (!) 139/105 (BP Location: Right Arm)    Pulse 70    Temp 99 F (37.2 C) (Oral)    Resp 16    SpO2 97%   Physical Exam Vitals signs and nursing note reviewed.  Constitutional:      General: She is not in acute distress.    Appearance: She is well-developed.  HENT:     Head: Normocephalic and atraumatic.  Eyes:     Conjunctiva/sclera: Conjunctivae normal.  Neck:     Musculoskeletal: Neck supple.  Cardiovascular:     Rate and Rhythm: Normal rate and regular rhythm.     Pulses: Normal pulses.     Heart sounds: Normal heart sounds. No murmur.  Pulmonary:     Effort: Pulmonary effort is normal. No  respiratory distress.     Breath sounds: Normal breath sounds. No wheezing, rhonchi or rales.  Abdominal:     General: Bowel sounds are normal.     Palpations: Abdomen is soft.     Tenderness: There is abdominal tenderness (LUQ, LLQ). There is left CVA tenderness. There is no right CVA tenderness, guarding or rebound.  Skin:    General: Skin is warm and dry.  Neurological:     Mental Status: She is alert.     ED Treatments / Results  Labs (all labs ordered  are listed, but only abnormal results are displayed) Labs Reviewed  COMPREHENSIVE METABOLIC PANEL - Abnormal; Notable for the following components:      Result Value   Glucose, Bld 112 (*)    BUN 21 (*)    Creatinine, Ser 1.19 (*)    GFR calc non Af Amer 52 (*)    All other components within normal limits  SARS CORONAVIRUS 2 (HOSPITAL ORDER, [facility])  CBC WITH DIFFERENTIAL/PLATELET  URINALYSIS, ROUTINE W REFLEX MICROSCOPIC    EKG None  Radiology Ct Renal Stone Study  Addendum Date: 02/03/2019   ADDENDUM REPORT: 02/03/2019 18:40 ADDENDUM: Minimal age indeterminate superior endplate deformity at O13. Electronically Signed   By: Donavan Foil M.D.   On: 02/03/2019 18:40   Result Date: 02/03/2019 CLINICAL DATA:  Dysuria and left flank pain EXAM: CT ABDOMEN AND PELVIS WITHOUT CONTRAST TECHNIQUE: Multidetector CT imaging of the abdomen and pelvis was performed following the standard protocol without IV contrast. COMPARISON:  CT 11/23/2014 FINDINGS: Lower chest: 6 mm right middle lobe subpleural pulmonary nodule, unchanged and therefore likely benign. Heart size within normal limits Hepatobiliary: No focal liver abnormality is seen. No gallstones, gallbladder wall thickening, or biliary dilatation. Pancreas: Unremarkable. No pancreatic ductal dilatation or surrounding inflammatory changes. Spleen: Normal in size without focal abnormality. Adrenals/Urinary Tract: Adrenal glands are unremarkable. Kidneys  are normal, without renal calculi, focal lesion, or hydronephrosis. Bladder is unremarkable. Stomach/Bowel: Stomach is within normal limits. Appendix appears normal. No evidence of bowel wall thickening, distention, or inflammatory changes. Vascular/Lymphatic: No significant vascular findings are present. No enlarged abdominal or pelvic lymph nodes. Reproductive: Calcified uterine fibroid.  No adnexal mass. Other: No abdominal wall hernia or abnormality. No abdominopelvic ascites. Musculoskeletal: No acute or significant osseous findings. IMPRESSION: 1. No CT evidence for acute intra-abdominal or pelvic abnormality. Negative for hydronephrosis or ureteral stone 2. Fibroid uterus Electronically Signed: By: Donavan Foil M.D. On: 02/03/2019 18:30    Procedures Procedures (including critical care time)  Medications Ordered in ED Medications  ketorolac (TORADOL) 30 MG/ML injection 30 mg (30 mg Intramuscular Given 02/03/19 1722)  ondansetron (ZOFRAN-ODT) disintegrating tablet 4 mg (4 mg Oral Given 02/03/19 1722)  famotidine (PEPCID) tablet 20 mg (20 mg Oral Given 02/03/19 1721)     Initial Impression / Assessment and Plan / ED Course  I have reviewed the triage vital signs and the nursing notes.  Pertinent labs & imaging results that were available during my care of the patient were reviewed by me and considered in my medical decision making (see chart for details).     Final Clinical Impressions(s) / ED Diagnoses   Final diagnoses:  Closed fracture of eleventh thoracic vertebra, unspecified fracture morphology, initial encounter [facility])   53 year old female presenting with left-sided flank pain and urinary symptoms ongoing for several months.  No fevers at home.  No vomiting.  Intermittent diarrhea.  No vaginal bleeding or abnormal vaginal discharge.  Vitals are reassuring.  She is well-appearing on exam.  She does have very mild abdominal tenderness but no peritoneal signs.  Labs And CT scan to  rule out nephrolithiasis.   CBC is without leukocytosis, no anemia CMP shows mildly elevated BUN.  Creatinine also slightly elevated but improved from prior.  Normal LFTs.  No elevated anion gap. UA shows no signs of infection  CT renal scan shows no evidence of ureterolithiasis, nephrolithiasis, hydronephrosis or other abnormality in the abdomen/pelvis.  She did have evidence of T11 endplate deformity  of unknown acuity which may be the cause of her left-sided flank pain.  We will give her follow-up with neurosurgery.  She has no neuro deficits today.  On reassessment patient continues to be in no acute distress and has no evidence of peritoneal signs. I explained to the patient the results land plan for discharge.  She voiced understanding and is in agreement with the plan. All questions were answered  Upon discharge, patient informed nursing staff that she has stopped taking her psych medications 2 weeks ago and l was wanting to "get admitted to the psych ward "so that she can get start back on her meds.  I then evaluated the patient and she voiced that she has been feeling suicidal and has a plan to cut herself.  She states that she has done so in the past.  I will consult TTS.   Pt transitioned to default provider at shift change pending TTS.   ED Discharge Orders    None       Bishop Dublin 02/03/19 2312    Blanchie Dessert, MD 02/05/19 2104

## 2019-02-03 NOTE — ED Triage Notes (Signed)
Pt arrives to ED from home with complaints of dysuria for two months and left flank pain for three months. Pain 9/10.

## 2019-02-03 NOTE — ED Notes (Signed)
Pt.'s belongings inventoried and placed in locker #6. Valuables envelope accepted by security.

## 2019-02-04 LAB — RAPID URINE DRUG SCREEN, HOSP PERFORMED
Amphetamines: POSITIVE — AB
Barbiturates: NOT DETECTED
Benzodiazepines: NOT DETECTED
Cocaine: POSITIVE — AB
Opiates: NOT DETECTED
Tetrahydrocannabinol: NOT DETECTED

## 2019-02-04 LAB — CBG MONITORING, ED: Glucose-Capillary: 116 mg/dL — ABNORMAL HIGH (ref 70–99)

## 2019-02-04 MED ORDER — LIDOCAINE VISCOUS HCL 2 % MT SOLN
15.0000 mL | Freq: Once | OROMUCOSAL | Status: AC
  Administered 2019-02-04: 15 mL via ORAL
  Filled 2019-02-04: qty 15

## 2019-02-04 MED ORDER — ALUM & MAG HYDROXIDE-SIMETH 200-200-20 MG/5ML PO SUSP
30.0000 mL | Freq: Once | ORAL | Status: AC
  Administered 2019-02-04: 30 mL via ORAL
  Filled 2019-02-04: qty 30

## 2019-02-04 NOTE — ED Provider Notes (Signed)
Rounded on patient  She has been recommended for inpatient treatment but is waiting acceptance.  On my evaluation, she is sleeping, resting comfortably on bed.  Portions of this note were generated with Lobbyist. Dictation errors may occur despite best attempts at proofreading.    Volanda Napoleon, PA-C 02/04/19 1515    Maudie Flakes, MD 02/07/19 1102

## 2019-02-04 NOTE — Progress Notes (Signed)
Pt accepted to [facility].  Dr. Orma Render is the accepting/attending provider.   Call report to [phone] [facility] ED notified.    Pt is voluntary and can be transported by [employer].   Pt will need to complete a UDS and have results sent with her to [facility] will call back later this morning to provide scheduled arrive time.   Audree Camel, LCSW, [place] Disposition [facility]/TTS [phone]

## 2019-02-04 NOTE — ED Notes (Signed)
Patient was given a Media planner and Drink.

## 2019-02-04 NOTE — ED Notes (Signed)
TTS in process 

## 2019-02-04 NOTE — Progress Notes (Signed)
Pt meets inpatient criteria per Anette Riedel, NP. Referral information has been sent to the following hospitals for review:  [facility]       Disposition will continue to assist with inpatient placement needs.   Audree Camel, LCSW, [place] Disposition [facility]/TTS [phone]

## 2019-02-04 NOTE — ED Notes (Signed)
Breakfast Tray ordered  
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
## 2019-03-21 ENCOUNTER — Other Ambulatory Visit: Payer: Self-pay | Admitting: Internal Medicine

## 2019-04-15 ENCOUNTER — Encounter: Payer: Self-pay | Admitting: Internal Medicine

## 2019-04-18 ENCOUNTER — Other Ambulatory Visit: Payer: Self-pay | Admitting: Internal Medicine

## 2019-06-14 ENCOUNTER — Other Ambulatory Visit: Payer: Self-pay | Admitting: Internal Medicine

## 2019-07-11 ENCOUNTER — Other Ambulatory Visit: Payer: Self-pay | Admitting: Internal Medicine

## 2019-09-06 ENCOUNTER — Other Ambulatory Visit: Payer: Self-pay | Admitting: Internal Medicine

## 2019-09-13 ENCOUNTER — Other Ambulatory Visit: Payer: Self-pay | Admitting: Internal Medicine

## 2019-10-05 ENCOUNTER — Other Ambulatory Visit: Payer: Self-pay | Admitting: Internal Medicine

## 2019-10-31 ENCOUNTER — Other Ambulatory Visit: Payer: Self-pay | Admitting: Internal Medicine

## 2019-11-23 ENCOUNTER — Encounter: Payer: Self-pay | Admitting: Internal Medicine

## 2019-11-23 ENCOUNTER — Ambulatory Visit (INDEPENDENT_AMBULATORY_CARE_PROVIDER_SITE_OTHER): Payer: Medicaid Other | Admitting: Internal Medicine

## 2019-11-23 VITALS — BP 138/88 | HR 70 | Resp 12 | Ht 64.5 in | Wt 214.0 lb

## 2019-11-23 DIAGNOSIS — R05 Cough: Secondary | ICD-10-CM

## 2019-11-23 DIAGNOSIS — M6789 Other specified disorders of synovium and tendon, multiple sites: Secondary | ICD-10-CM | POA: Diagnosis not present

## 2019-11-23 DIAGNOSIS — H0520 Unspecified exophthalmos: Secondary | ICD-10-CM

## 2019-11-23 DIAGNOSIS — E785 Hyperlipidemia, unspecified: Secondary | ICD-10-CM

## 2019-11-23 DIAGNOSIS — I10 Essential (primary) hypertension: Secondary | ICD-10-CM

## 2019-11-23 DIAGNOSIS — Z79899 Other long term (current) drug therapy: Secondary | ICD-10-CM

## 2019-11-23 DIAGNOSIS — R059 Cough, unspecified: Secondary | ICD-10-CM

## 2019-11-23 DIAGNOSIS — G4733 Obstructive sleep apnea (adult) (pediatric): Secondary | ICD-10-CM

## 2019-11-23 DIAGNOSIS — E118 Type 2 diabetes mellitus with unspecified complications: Secondary | ICD-10-CM | POA: Diagnosis not present

## 2019-11-23 DIAGNOSIS — F250 Schizoaffective disorder, bipolar type: Secondary | ICD-10-CM

## 2019-11-23 DIAGNOSIS — Z794 Long term (current) use of insulin: Secondary | ICD-10-CM

## 2019-11-23 DIAGNOSIS — Z114 Encounter for screening for human immunodeficiency virus [HIV]: Secondary | ICD-10-CM

## 2019-11-23 DIAGNOSIS — B353 Tinea pedis: Secondary | ICD-10-CM

## 2019-11-23 MED ORDER — ACCU-CHEK AVIVA PLUS W/DEVICE KIT: PACK | 0 refills | Status: DC

## 2019-11-23 MED ORDER — DICLOFENAC SODIUM 1 % EX GEL: CUTANEOUS | 11 refills | Status: DC

## 2019-11-23 MED ORDER — TERBINAFINE HCL 1 % EX CREA: TOPICAL_CREAM | CUTANEOUS | 2 refills | Status: DC

## 2019-11-23 MED ORDER — FEXOFENADINE HCL 180 MG PO TABS: 180.0000 mg | ORAL_TABLET | Freq: Every day | ORAL | 11 refills | Status: DC

## 2019-11-23 MED ORDER — ATORVASTATIN CALCIUM 40 MG PO TABS: ORAL_TABLET | ORAL | 11 refills | Status: DC

## 2019-11-23 MED ORDER — AMLODIPINE BESYLATE 10 MG PO TABS: 10.0000 mg | ORAL_TABLET | Freq: Every day | ORAL | 11 refills | Status: DC

## 2019-11-23 MED ORDER — METFORMIN HCL 1000 MG PO TABS: ORAL_TABLET | ORAL | 11 refills | Status: DC

## 2019-11-23 MED ORDER — ACCU-CHEK AVIVA PLUS VI STRP: ORAL_STRIP | 12 refills | Status: DC

## 2019-11-23 MED ORDER — MOMETASONE FUROATE 50 MCG/ACT NA SUSP: NASAL | 12 refills | Status: DC

## 2019-11-23 MED ORDER — GABAPENTIN 300 MG PO CAPS: 300.0000 mg | ORAL_CAPSULE | Freq: Three times a day (TID) | ORAL | 11 refills | Status: DC

## 2019-11-23 MED ORDER — ACCU-CHEK SOFTCLIX LANCETS MISC: 12 refills | Status: DC

## 2019-11-23 NOTE — Patient Instructions (Addendum)
If Medicaid does not cover Allegra, get the generic Fexofenadine over the counter--same for Nasonex, which is Mometasone over the counter.  Same for Terbinafine, which is over the counter in foot area.  Go check out [employer] at [facility] for pool exercise.

## 2019-11-23 NOTE — Progress Notes (Signed)
Subjective:    Patient ID: Maureen Swanson, female   DOB: [birth year], 54 y.o.   MRN: [id number]   HPI   Here to reestablish  Concerns  1.  Deep dry cough.  Coughs until she gags.  She has had cough for 3 months.  Stopped smoking about 1 month ago, but cough has not improved.  Not taking an ACE I or ARB for BP.  Feels dyspneic mainly when lying down.  The cough is worse then as well.  States has posterior drainage, but not with bad taste.  Eyes and nose are watery and itchy and sneezing.  No itching of throat.  Ears without symptoms.   Taking Loratadine daily.  Has never used a corticosteroid nasal spray.   States she start Loratadine when here for COVID vaccine clinic with allergy symptoms.  Has not improved her symptoms.  Has never used antihistamines in past for her spring allergies (this is not a new problem for her)  2. Hypertension:  Fair control on current meds.    3.  Knees:  Get stiff easily if sits for a period of time.  Not clear if any swelling.  Does not feel Meloxicam has helped with knee stiffness and stiffness.  Has never tried Voltaren gel.    4.  Mental Health:  Doing fair.  Stays depressed as she is bored.  Goes for walks to her son's house. She does have a bicycle.  Would be interested in doing volunteer work.  Knees bother her too much to work in Museum/gallery curator work.  5. Knots under her left foot.  These hurt and her feet are dry and itchy  6.  DM:  Has not been checking sugars as lost monitor.  Last eye exam some time ago.  7.  OSA:  Had a sleep study a few years ago, but never obtained CPAP as did not have Medicaid at the time to cover the equipment.  Looks like she may have had sleep study on 03/11/2015, but do not see the actual report. Tried to set her up in 2019 and not clear why never went at this point.  Current Meds  Medication Sig  . ABILIFY MAINTENA 300 MG PRSY prefilled syringe 300 mg every 28 (twenty-eight) days.   Marland Kitchen amLODipine (NORVASC) 10 MG  tablet TAKE 1 TABLET BY MOUTH ONCE DAILY.  Marland Kitchen atorvastatin (LIPITOR) 40 MG tablet TAKE (1) TABLET BY MOUTH DAILY WITH SUPPER.  . benztropine (COGENTIN) 1 MG tablet Take by mouth daily.   Marland Kitchen gabapentin (NEURONTIN) 300 MG capsule Take 300 mg by mouth 3 (three) times daily.  . hydrochlorothiazide (HYDRODIURIL) 12.5 MG tablet TAKE 1 TABLET BY MOUTH ONCE DAILY.  Marland Kitchen loratadine (CLARITIN) 10 MG tablet Take 10 mg by mouth daily.  . Melatonin 3 MG CAPS Take 1 capsule by mouth at bedtime as needed.  . meloxicam (MOBIC) 15 MG tablet TAKE 1 TABLET BY MOUTH ONCE DAILY WITH A MEAL.  . metFORMIN (GLUCOPHAGE) 1000 MG tablet TAKE 1 TABLET BY MOUTH TWICE DAILY WITH A MEAL.  . prazosin (MINIPRESS) 1 MG capsule Take 1 mg by mouth at bedtime.  . traZODone (DESYREL) 150 MG tablet Take 150 mg by mouth as directed.  . Vitamins/Minerals TABS Take by mouth. 1 daily   Allergies  Allergen Reactions  . Chocolate Hives  . Orange Hives    "Acid foods"  . Penicillins Hives    Has patient had a PCN reaction causing immediate rash, facial/tongue/throat  swelling, SOB or lightheadedness with hypotension: Yes Has patient had a PCN reaction causing severe rash involving mucus membranes or skin necrosis: Yes Has patient had a PCN reaction that required hospitalization No Has patient had a PCN reaction occurring within the last 10 years: No If all of the above answers are "NO", then may proceed with Cephalosporin use.   . Other Swelling    strawberries  . Tomato Hives    "acid foods"     Review of Systems    Objective:   BP 138/88 (BP Location: Left Arm, Patient Position: Sitting, Cuff Size: Normal)   Pulse 70   Resp 12   Ht 5' 4.5" (1.638 m)   Wt 214 lb (97.1 kg)   BMI 36.17 kg/m   Physical Exam  NAD HEENT:  PERRL, EOMI, possibly some exophthalmos, particularly of left eye.  Conjunctivae with mild injection and clear watering.  Nasal mucosa boggy with clear discharge.  Some tenderness over frontal and  maxillary sinuses.  Unable to see past uvula to posterior pharynx.   Neck:  Supple, No adenopathy, no thyromegaly Chest:  CTA CV:  RRR with normal S1 and S2, No S3, S4 or murmur.  No carotid bruits.  Carotid, radial and DP pulses normal and equal. Abd:  S, NT, No HSM or mass, + BS LE:  No edema.  Knees with a bit of hypertrophic change, but no effusion.  Full ROM.  Feet with dry flaking skin at edges and plantar surfaces.  Some discoloration but no real thickening of nails Left great toe flexor tendon with an ovoid 1.5 by 1 cm thickening in distal medial arch of foot and smaller thickening about 1 cm distally.  Tender to palpation  No overlying erythema.   Assessment & Plan   1.  Cough:  Likely due to seasonal allergies:  Stop Loratadine.  Fexofenadine 180 mg daily with nasonex 2 sprays each nostril daily.    CXR if no improvement.  2.  DM:  Refilled glucometer and equipment for testing.  Metformin 1000 mg twice daily.  Eye referral.  A1C, urine microalbumin/crea  3.  Hypertension:  Controlled on Amlodipine and HCTZ.  CMP  4.  Tinea pedis:  Terbinafine 1% twice daily for 14 days or until resolved.  5.  DJD knees:  Diclofenac gel 2g up to 4 times daily to knees.  Encouraged cycling to [employer] and applying for silver sneakers to get in pool and walk laps or other exercise facilities included.  6.  Eye changes:  Likely normal variant.  TSH  7.  Left great toe flexor tendon thickening x 2:  Podiatry referral  8.  OSA:  Send for split night sleep study as has difficulties with transportation.  9. COVID 19:  Obtained Moderna here already.  10.  Dyslipidemia:  FLP  10.  HM:  HIV, CBC

## 2019-11-24 LAB — CBC WITH DIFFERENTIAL/PLATELET
Basophils Absolute: 0 10*3/uL (ref 0.0–0.2)
Basos: 0 %
EOS (ABSOLUTE): 0.1 10*3/uL (ref 0.0–0.4)
Eos: 1 %
Hematocrit: 38.9 % (ref 34.0–46.6)
Hemoglobin: 12.8 g/dL (ref 11.1–15.9)
Immature Grans (Abs): 0 10*3/uL (ref 0.0–0.1)
Immature Granulocytes: 0 %
Lymphocytes Absolute: 1.5 10*3/uL (ref 0.7–3.1)
Lymphs: 25 %
MCH: 29.3 pg (ref 26.6–33.0)
MCHC: 32.9 g/dL (ref 31.5–35.7)
MCV: 89 fL (ref 79–97)
Monocytes Absolute: 0.3 10*3/uL (ref 0.1–0.9)
Monocytes: 5 %
Neutrophils Absolute: 4.1 10*3/uL (ref 1.4–7.0)
Neutrophils: 69 %
Platelets: 230 10*3/uL (ref 150–450)
RBC: 4.37 x10E6/uL (ref 3.77–5.28)
RDW: 11.1 % — ABNORMAL LOW (ref 11.7–15.4)
WBC: 5.9 10*3/uL (ref 3.4–10.8)

## 2019-11-24 LAB — COMPREHENSIVE METABOLIC PANEL
ALT: 10 IU/L (ref 0–32)
AST: 13 IU/L (ref 0–40)
Albumin/Globulin Ratio: 1.7 (ref 1.2–2.2)
Albumin: 4.4 g/dL (ref 3.8–4.9)
Alkaline Phosphatase: 81 IU/L (ref 48–121)
BUN/Creatinine Ratio: 21 (ref 9–23)
BUN: 21 mg/dL (ref 6–24)
Bilirubin Total: <0.2 mg/dL (ref 0.0–1.2)
CO2: 22 mmol/L (ref 20–29)
Calcium: 9.9 mg/dL (ref 8.7–10.2)
Chloride: 109 mmol/L — ABNORMAL HIGH (ref 96–106)
Creatinine, Ser: 1 mg/dL (ref 0.57–1.00)
GFR calc Af Amer: 74 mL/min/{1.73_m2} (ref >59)
GFR calc non Af Amer: 64 mL/min/{1.73_m2} (ref >59)
Globulin, Total: 2.6 g/dL (ref 1.5–4.5)
Glucose: 112 mg/dL — ABNORMAL HIGH (ref 65–99)
Potassium: 4.5 mmol/L (ref 3.5–5.2)
Sodium: 143 mmol/L (ref 134–144)
Total Protein: 7 g/dL (ref 6.0–8.5)

## 2019-11-24 LAB — LIPID PANEL W/O CHOL/HDL RATIO
Cholesterol, Total: 202 mg/dL — ABNORMAL HIGH (ref 100–199)
HDL: 55 mg/dL (ref >39)
LDL Chol Calc (NIH): 135 mg/dL — ABNORMAL HIGH (ref 0–99)
Triglycerides: 65 mg/dL (ref 0–149)
VLDL Cholesterol Cal: 12 mg/dL (ref 5–40)

## 2019-11-24 LAB — TSH: TSH: 1.31 u[IU]/mL (ref 0.450–4.500)

## 2019-11-24 LAB — HIV ANTIBODY (ROUTINE TESTING W REFLEX): HIV Screen 4th Generation wRfx: NONREACTIVE

## 2019-11-24 LAB — MICROALBUMIN / CREATININE URINE RATIO
Creatinine, Urine: 188.4 mg/dL
Microalb/Creat Ratio: 8 mg/g creat (ref 0–29)
Microalbumin, Urine: 16 ug/mL

## 2019-11-24 LAB — HGB A1C W/O EAG: Hgb A1c MFr Bld: 5.5 % (ref 4.8–5.6)

## 2019-11-25 ENCOUNTER — Other Ambulatory Visit: Payer: Self-pay | Admitting: Internal Medicine

## 2019-12-06 ENCOUNTER — Ambulatory Visit (INDEPENDENT_AMBULATORY_CARE_PROVIDER_SITE_OTHER): Payer: Medicaid Other | Admitting: Podiatry

## 2019-12-06 ENCOUNTER — Encounter: Payer: Self-pay | Admitting: Podiatry

## 2019-12-06 ENCOUNTER — Other Ambulatory Visit: Payer: Self-pay | Admitting: Podiatry

## 2019-12-06 ENCOUNTER — Telehealth: Payer: Self-pay | Admitting: *Deleted

## 2019-12-06 ENCOUNTER — Ambulatory Visit (INDEPENDENT_AMBULATORY_CARE_PROVIDER_SITE_OTHER): Payer: Medicaid Other

## 2019-12-06 ENCOUNTER — Ambulatory Visit: Payer: Medicaid Other

## 2019-12-06 ENCOUNTER — Other Ambulatory Visit: Payer: Self-pay

## 2019-12-06 DIAGNOSIS — M109 Gout, unspecified: Secondary | ICD-10-CM | POA: Insufficient documentation

## 2019-12-06 DIAGNOSIS — M722 Plantar fascial fibromatosis: Secondary | ICD-10-CM | POA: Diagnosis not present

## 2019-12-06 DIAGNOSIS — M7751 Other enthesopathy of right foot: Secondary | ICD-10-CM | POA: Diagnosis not present

## 2019-12-06 DIAGNOSIS — M778 Other enthesopathies, not elsewhere classified: Secondary | ICD-10-CM

## 2019-12-06 DIAGNOSIS — M7750 Other enthesopathy of unspecified foot: Secondary | ICD-10-CM

## 2019-12-06 NOTE — Telephone Encounter (Signed)
-----   Message from Rip Harbour, [facility] sent at 12/06/2019  9:32 AM EDT ----- Regarding: MRI MRI left foot - evaluate plantar fibroma with contrast left - surgical consideration

## 2019-12-06 NOTE — Telephone Encounter (Signed)
Faxed orders to [facility] to schedule.

## 2019-12-06 NOTE — Telephone Encounter (Signed)
done

## 2019-12-06 NOTE — Progress Notes (Addendum)
Subjective:  Patient ID: Maureen Swanson, female    DOB: 06/10/1966,  MRN: [id number] HPI Chief Complaint  Patient presents with  . Foot Pain    Plantar arch left - lumps x 2 couple months, increased in size, hurts when walking, itches sometimes  . Foot Pain    Lateral foot and ankle right - aching x couple months, no injury, some swelling, no treatment  . New Patient (Initial Visit)    54 y.o. female presents with the above complaint.   ROS: Denies fever chills nausea vomiting muscle aches pains calf pain back pain chest pain shortness of breath.  Past Medical History:  Diagnosis Date  . Anxiety   . Arthritis    back and knees  . Asthma    daily and prn inhalers  . 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 Bipolar 1 disorder ([place])   . CHF (congestive heart failure) ([place])   . Depression   . Diabetes mellitus    diet-controlled  . Gastric ulcer   . GERD (gastroesophageal reflux disease)   . Gout   . Headache(784.0)    migraines  . High cholesterol   . Hypertension    under control, has been on med. x 12 yrs.  . Substance abuse ([place])    crack cocaine:  extensive treatment with [facility] from 2014 to 2018 for this and alcohol abuse.  Marland Kitchen TMJ (temporomandibular joint disorder)    Past Surgical History:  Procedure Laterality Date  . BREAST LUMPECTOMY WITH NEEDLE LOCALIZATION  04/19/2012   Procedure: BREAST LUMPECTOMY WITH NEEDLE LOCALIZATION;  Surgeon: [name], MD;  Location: [facility];  Service: General;  Laterality: Right;  . KNEE ARTHROSCOPY W/ PARTIAL MEDIAL MENISCECTOMY  05/01/2010   right    Current Outpatient Medications:  .  ABILIFY MAINTENA 300 MG PRSY prefilled syringe, 300 mg every 28 (twenty-eight) days. , Disp: , Rfl:  .  Accu-Chek Softclix Lancets lancets, Check blood glucose twice daily before meals, Disp: 100 each, Rfl: 12 .  amLODipine (NORVASC) 10 MG tablet, Take 1 tablet (10 mg total)  by mouth daily., Disp: 30 tablet, Rfl: 11 .  atorvastatin (LIPITOR) 40 MG tablet, 1 tab by mouth with evening meal, Disp: 30 tablet, Rfl: 11 .  benztropine (COGENTIN) 1 MG tablet, Take by mouth daily. , Disp: , Rfl:  .  Blood Glucose Monitoring Suppl (ACCU-CHEK AVIVA PLUS) w/Device KIT, Check blood glucose twice daily before meals, Disp: 1 kit, Rfl: 0 .  diclofenac Sodium (VOLTAREN) 1 % GEL, Apply 2g to knees up to 4 times daily as needed for pain, Disp: 100 g, Rfl: 11 .  fexofenadine (ALLEGRA) 180 MG tablet, Take 1 tablet (180 mg total) by mouth daily., Disp: 30 tablet, Rfl: 11 .  gabapentin (NEURONTIN) 300 MG capsule, Take 1 capsule (300 mg total) by mouth 3 (three) times daily., Disp: 90 capsule, Rfl: 11 .  glucose blood (ACCU-CHEK AVIVA PLUS) test strip, Check blood glucose twice daily before meals, Disp: 100 each, Rfl: 12 .  hydrochlorothiazide (HYDRODIURIL) 12.5 MG tablet, TAKE 1 TABLET BY MOUTH ONCE DAILY., Disp: 28 tablet, Rfl: 5 .  INGREZZA 80 MG CAPS, Take 1 capsule by mouth at bedtime., Disp: , Rfl:  .  LAMICTAL 100 MG tablet, Take 100 mg by mouth daily., Disp: , Rfl:  .  Melatonin 3 MG CAPS, Take 1 capsule by mouth at bedtime as needed., Disp: , Rfl:  .  meloxicam (MOBIC) 15 MG tablet, TAKE 1 TABLET BY MOUTH ONCE DAILY WITH A MEAL., Disp: 28 tablet, Rfl: 5 .  metFORMIN (GLUCOPHAGE) 1000 MG tablet, TAKE 1 TABLET BY MOUTH TWICE DAILY WITH A MEAL., Disp: 60 tablet, Rfl: 11 .  mometasone (NASONEX) 50 MCG/ACT nasal spray, 2 sprays each nostril daily, Disp: 17 g, Rfl: 12 .  prazosin (MINIPRESS) 1 MG capsule, Take 1 mg by mouth at bedtime., Disp: , Rfl:  .  terbinafine (LAMISIL) 1 % cream, Apply to feet twice daily, Disp: 42 g, Rfl: 2 .  traZODone (DESYREL) 150 MG tablet, Take 150 mg by mouth as directed., Disp: , Rfl:  .  Vitamins/Minerals TABS, Take by mouth. 1 daily, Disp: , Rfl:   Allergies  Allergen Reactions  . Chocolate Hives  . Orange Hives    "Acid foods"  . Penicillins Hives      Has patient had a PCN reaction causing immediate rash, facial/tongue/throat swelling, SOB or lightheadedness with hypotension: Yes Has patient had a PCN reaction causing severe rash involving mucus membranes or skin necrosis: Yes Has patient had a PCN reaction that required hospitalization No Has patient had a PCN reaction occurring within the last 10 years: No If all of the above answers are "NO", then may proceed with Cephalosporin use.   . Other Swelling    strawberries  . Strawberry Extract Swelling  . Tomato Hives    "acid foods"   Review of Systems Objective:  There were no vitals filed for this visit.  General: Well developed, nourished, in no acute distress, alert and oriented x3   Dermatological: Skin is warm, dry and supple bilateral. Nails x 10 are well maintained; remaining integument appears unremarkable at this time. There are no open sores, no preulcerative lesions, no rash or signs of infection present.  2 large plantar fibromas medial longitudinal arch measuring about 3.0 cm in diameter from the distal aspect of the foot back to the level of the TMT joints.  They are firm nonpulsatile appeared about 80 fibromatous.  Moderately tender on palpation.  Vascular: Dorsalis Pedis artery and Posterior Tibial artery pedal pulses are 2/4 bilateral with immedate capillary fill time. Pedal hair growth present. No varicosities and no lower extremity edema present bilateral.   Neruologic: Grossly intact via light touch bilateral. Vibratory intact via tuning fork bilateral. Protective threshold with Semmes Wienstein monofilament intact to all pedal sites bilateral. Patellar and Achilles deep tendon reflexes 2+ bilateral. No Babinski or clonus noted bilateral.   Musculoskeletal: No gross boney pedal deformities bilateral. No pain, crepitus, or limitation noted with foot and ankle range of motion bilateral. Muscular strength 5/5 in all groups tested bilateral.  Plantar fibromas plantar  aspect of the left foot.  Right foot demonstrates pain on end range of motion of the subtalar joint with pain on palpation of the sinus tarsi right foot.  Gait: Unassisted, Nonantalgic.    Radiographs:  Radiographs taken today demonstrate pes planus to the right foot mildly arched foot left with soft tissue increase in density along the plantar fascia.  This is indicative of plantar fibromatosis does not demonstrate any calcification within the tissue.  Assessment & Plan:   Assessment: Plantar fibromatosis left foot.  Right foot sinus tarsitis with pes planus.   Plan: Discussed etiology pathology conservative versus surgical therapies at this point time went ahead and I injected subtalar joint right with 10 mg Kenalog 5 mg Marcaine point maximal tenderness.  Tolerated procedure well.  We will send  her for an MRI to confirm plantar fibroma.  Surgical consideration.     Charlisha Market T. [place], [place]

## 2019-12-09 NOTE — Telephone Encounter (Signed)
[facility] - Medicaid requires clinicals to review prior to pre-cert for MRI left foot 73720 with and without contrast, Case: [id number]. Faxed clinicals of 12/06/2019 to [facility].

## 2019-12-14 ENCOUNTER — Other Ambulatory Visit: Payer: Self-pay | Admitting: Internal Medicine

## 2019-12-27 ENCOUNTER — Ambulatory Visit: Payer: Medicaid Other | Admitting: Internal Medicine

## 2020-01-09 ENCOUNTER — Ambulatory Visit
Admission: RE | Admit: 2020-01-09 | Discharge: 2020-01-09 | Disposition: A | Discharge status: Home / Self Care | Payer: Medicaid Other | Source: Ambulatory Visit | Attending: Podiatry | Admitting: Podiatry

## 2020-01-09 ENCOUNTER — Other Ambulatory Visit: Payer: Self-pay

## 2020-01-09 DIAGNOSIS — M722 Plantar fascial fibromatosis: Secondary | ICD-10-CM

## 2020-01-09 MED ORDER — GADOBENATE DIMEGLUMINE 529 MG/ML IV SOLN
20.0000 mL | Freq: Once | INTRAVENOUS | Status: AC | PRN
  Administered 2020-01-09: 20 mL via INTRAVENOUS

## 2020-01-26 ENCOUNTER — Telehealth: Payer: Self-pay | Admitting: Podiatry

## 2020-01-26 ENCOUNTER — Other Ambulatory Visit: Payer: Self-pay

## 2020-01-26 ENCOUNTER — Ambulatory Visit (INDEPENDENT_AMBULATORY_CARE_PROVIDER_SITE_OTHER): Payer: Medicaid Other | Admitting: Podiatry

## 2020-01-26 ENCOUNTER — Encounter: Payer: Self-pay | Admitting: Podiatry

## 2020-01-26 DIAGNOSIS — M722 Plantar fascial fibromatosis: Secondary | ICD-10-CM | POA: Diagnosis not present

## 2020-01-26 NOTE — Progress Notes (Signed)
She presents today for follow-up of her MRI and her plantar fibromatosis.  She states that I think they have grown and they have gotten a lot worse and more painful.  Objective: Vital signs are stable she is alert oriented x3 palpable nodules medial band of the plantar fascia left foot do not really feel much bigger than they did when I saw them last but MRI does state plantar fibroma x2 medial band plantar fascia.  Assessment: Plantar fibromatosis.  Plan: At this point we consented her for surgical excision of these painful nodules.  We did discuss the possible postop complications which may include but not limited to postop pain bleeding swelling infection recurrence need for further surgery overcorrection under correction also digit loss of limb loss of life she knows that she will have to use crutches for at least the next 3 weeks.  I will follow-up with her in the near future for surgical intervention she understands this and is amenable to it.

## 2020-01-26 NOTE — Telephone Encounter (Signed)
I'm calling to get sx scheduled for my left foot. I need one as soon as possible. Thank you.

## 2020-01-27 NOTE — Telephone Encounter (Signed)
I just spoke with the lady there and got my sx scheduled. Can you please call me sometime next week and let me know the time. I have to arrange for my transportation ride 3-4 days ahead of time. I need my sx time so I can arrange my transportation to and from my sx. Thanks.

## 2020-01-27 NOTE — Telephone Encounter (Signed)
Called pt back and apologized for the delay. Told her the first Friday I could schedule her for sx with Dr. Milinda Pointer was Friday, 09/03. Told pt her sx would be sometime in the morning, but I could not give a specific time due to possible changes in their schedules and any pt's that may be diabetic or pediatric that would go back first. Told pt to call with any questions and/or concerns.

## 2020-01-27 NOTE — Telephone Encounter (Signed)
I was calling to schedule an appointment for foot sx. Please give me a call back. Thank you.

## 2020-02-03 NOTE — Telephone Encounter (Signed)
I'm scheduled for sx on 9/3 and I need to know the time of my sx. I have to give it to my transportation 3-4 days in advance. I asked the pt if a family member or friend would be going with her to the sx center and she responded yes. I told her that if they were not fully vaccinated they would have to wait outside but if they were vaccinated, they would be able to wait in the facility. I told the pt I would not be able to give her a sx time. I told her I would send everything to the sx center and let them know what's going on and to call her with that information. I explained to her that sometimes their schedules do change and if anyone is pediatric or diabetic, they would go back first.

## 2020-02-08 ENCOUNTER — Telehealth: Payer: Self-pay | Admitting: Podiatry

## 2020-02-08 DIAGNOSIS — Z9889 Other specified postprocedural states: Secondary | ICD-10-CM

## 2020-02-08 DIAGNOSIS — R2689 Other abnormalities of gait and mobility: Secondary | ICD-10-CM

## 2020-02-08 NOTE — Telephone Encounter (Signed)
I'm scheduled for sx on 9/3 and I need a home health aid. I have someone I can get already with [facility] I think it is. I need som paperwork from you all so I can let her take to her to people to do the paperwork. I also have some questions. If you can give me a call back today as soon as possible. Thank you.

## 2020-02-09 NOTE — Telephone Encounter (Signed)
Called patient -   I have ordered and printed out the referral for patient to receive home health care for after her surgery . She is going to call me back with a contact name and fax number to send order to so they can start the paperwork at [facility]. I'll wait to hear from her on that.

## 2020-02-09 NOTE — Addendum Note (Signed)
Addended by: Clovis Riley E on: 02/09/2020 12:19 PM   Modules accepted: Orders

## 2020-02-09 NOTE — Telephone Encounter (Signed)
I'm going to need a home health aid person after I get out of sx. Can you all please call me and let me know what I need to do before my sx. Thank you.

## 2020-02-09 NOTE — Telephone Encounter (Signed)
Pt called back wanting to know what she needs to do to get a home health aid out to help her after her sx next Friday, 9/3.

## 2020-02-10 NOTE — Telephone Encounter (Signed)
I'm calling to give you the fax number for my health care aid. That fax number is [phone], [facility] in [place]. The worker's name is Clydene Laming. They're waiting on the prescription. Please give them a call as soon as possible.

## 2020-02-16 ENCOUNTER — Other Ambulatory Visit: Payer: Self-pay | Admitting: Podiatry

## 2020-02-16 MED ORDER — CLINDAMYCIN HCL 150 MG PO CAPS: 150.0000 mg | ORAL_CAPSULE | Freq: Three times a day (TID) | ORAL | 0 refills | Status: DC

## 2020-02-16 MED ORDER — OXYCODONE-ACETAMINOPHEN 10-325 MG PO TABS: 1.0000 | ORAL_TABLET | Freq: Three times a day (TID) | ORAL | 0 refills | Status: DC | PRN

## 2020-02-16 MED ORDER — ONDANSETRON HCL 4 MG PO TABS: 4.0000 mg | ORAL_TABLET | Freq: Three times a day (TID) | ORAL | 0 refills | Status: DC | PRN

## 2020-02-17 ENCOUNTER — Other Ambulatory Visit: Payer: Self-pay | Admitting: Podiatry

## 2020-02-17 DIAGNOSIS — M722 Plantar fascial fibromatosis: Secondary | ICD-10-CM

## 2020-02-17 DIAGNOSIS — R2689 Other abnormalities of gait and mobility: Secondary | ICD-10-CM

## 2020-02-23 ENCOUNTER — Telehealth: Payer: Self-pay | Admitting: Podiatry

## 2020-02-23 ENCOUNTER — Encounter: Payer: Medicaid Other | Admitting: Podiatry

## 2020-02-23 MED ORDER — OXYCODONE-ACETAMINOPHEN 10-325 MG PO TABS
1.0000 | ORAL_TABLET | Freq: Four times a day (QID) | ORAL | 0 refills | Status: AC | PRN
Start: 2020-02-23 — End: 2020-02-28

## 2020-02-23 NOTE — Telephone Encounter (Signed)
Maureen Swanson called with persistent post operative pain. Advised her to continue to ice/elevate, loosen the ACE wrap and she can decrease interval between medication to 6 hours. She has 2 tablets left, and I sent her a refill of oxycodone/acetaminophen that will last to her post op appt on Tuesday 9/16

## 2020-02-24 ENCOUNTER — Encounter: Payer: Self-pay | Admitting: Podiatry

## 2020-02-28 ENCOUNTER — Encounter: Payer: Medicaid Other | Admitting: Podiatry

## 2020-03-01 ENCOUNTER — Encounter: Payer: Medicaid Other | Admitting: Podiatry

## 2020-03-06 ENCOUNTER — Other Ambulatory Visit: Payer: Self-pay

## 2020-03-06 ENCOUNTER — Ambulatory Visit (INDEPENDENT_AMBULATORY_CARE_PROVIDER_SITE_OTHER): Payer: Medicaid Other | Admitting: Podiatry

## 2020-03-06 ENCOUNTER — Encounter: Payer: Self-pay | Admitting: Podiatry

## 2020-03-06 ENCOUNTER — Encounter: Payer: Medicaid Other | Admitting: Podiatry

## 2020-03-06 DIAGNOSIS — M722 Plantar fascial fibromatosis: Secondary | ICD-10-CM

## 2020-03-06 DIAGNOSIS — Z9889 Other specified postprocedural states: Secondary | ICD-10-CM

## 2020-03-06 MED ORDER — OXYCODONE-ACETAMINOPHEN 10-325 MG PO TABS
1.0000 | ORAL_TABLET | Freq: Three times a day (TID) | ORAL | 0 refills | Status: AC | PRN
Start: 2020-03-06 — End: 2020-03-13

## 2020-03-06 NOTE — Progress Notes (Signed)
She presents today for follow-up actually her first postop visit from excision of plantar fibroma left foot.  She denies fever chills nausea vomiting muscle aches pains calf pain back pain chest pain shortness of breath states that the foot is been throbbing some occasionally other than that has not been too bad but she is out of pain medicine.  Her date of surgery 02/17/2020.  Objective: Presents today ambulating in her cam walker though she is supposed to be nonweightbearing at this point.  Dry sterile dressing was removed demonstrates no erythematous mild edema no cellulitis drainage or odor noted dehiscence.  Sutures and staples are intact plantarly.  Assessment: Well-healing surgical foot excision plantar fibroma nearly 3 weeks.  Plan: I redressed her today dressed a compressive dressing placed her back in her cam walker because she is been walking on this I do not want to take the staples out yet I did remove the sutures there is no dehiscence at all.  I will follow-up with her and an extra week just to make sure that she is healed.  I also sent over some more pain medicine.

## 2020-03-13 ENCOUNTER — Telehealth: Payer: Self-pay | Admitting: Internal Medicine

## 2020-03-13 NOTE — Telephone Encounter (Signed)
Natasha Bence - Nurse Practitioner at [facility] called requesting Sleep study order for patient due to patient feeling tired during the day, overweight and other issues.    Please share information with Dr. Amil Amen

## 2020-03-15 ENCOUNTER — Encounter: Payer: Medicaid Other | Admitting: Podiatry

## 2020-03-15 NOTE — Progress Notes (Signed)
Patient was a no show for appointment  °

## 2020-03-22 ENCOUNTER — Other Ambulatory Visit: Payer: Self-pay

## 2020-03-22 ENCOUNTER — Encounter: Payer: Self-pay | Admitting: Podiatry

## 2020-03-22 ENCOUNTER — Ambulatory Visit (INDEPENDENT_AMBULATORY_CARE_PROVIDER_SITE_OTHER): Payer: Medicaid Other | Admitting: Podiatry

## 2020-03-22 DIAGNOSIS — Z9889 Other specified postprocedural states: Secondary | ICD-10-CM

## 2020-03-22 DIAGNOSIS — M722 Plantar fascial fibromatosis: Secondary | ICD-10-CM

## 2020-03-22 MED ORDER — OXYCODONE-ACETAMINOPHEN 10-325 MG PO TABS
1.0000 | ORAL_TABLET | Freq: Four times a day (QID) | ORAL | 0 refills | Status: AC | PRN
Start: 2020-03-22 — End: 2020-03-29

## 2020-03-22 MED ORDER — OXYCODONE-ACETAMINOPHEN 10-325 MG PO TABS: 1.0000 | ORAL_TABLET | Freq: Four times a day (QID) | ORAL | 0 refills | Status: DC | PRN

## 2020-03-22 NOTE — Progress Notes (Signed)
She presents today for follow-up of her excision plantar fibromas 02/17/2020 states that she still in pain.  Objective: Presents today ambulating with her cam walker even though specially nonweightbearing.  Objective: Vital signs are stable she is alert and oriented x3 there is no erythema edema cellulitis drainage or odor staples are intact removed today demonstrates well coapted incision mildly tender on palpation.  Assessment: Well-healing surgical foot left.  Plan: Instructed her to get back to her regular walking and I will follow-up with her in 2 to 3 weeks.  Refilled her pain medication.  Instructed that she would not have any more.

## 2020-03-23 NOTE — Telephone Encounter (Signed)
She needs an appt with me to discuss.  I don't know in what capacity the NP saw her and cannot see anything in Care Everywhere

## 2020-03-23 NOTE — Telephone Encounter (Signed)
Pt. Scheduled for 05/02/2020 @ 4:00 pm

## 2020-03-29 ENCOUNTER — Encounter: Payer: Medicaid Other | Admitting: Podiatry

## 2020-04-12 ENCOUNTER — Encounter: Payer: Medicaid Other | Admitting: Podiatry

## 2020-05-02 ENCOUNTER — Ambulatory Visit: Payer: Medicaid Other | Admitting: Internal Medicine

## 2020-05-16 ENCOUNTER — Other Ambulatory Visit: Payer: Self-pay | Admitting: Internal Medicine

## 2020-06-12 ENCOUNTER — Ambulatory Visit: Payer: Medicaid Other | Admitting: Podiatry

## 2020-06-16 HISTORY — PX: FOOT SURGERY: SHX648

## 2020-06-26 ENCOUNTER — Ambulatory Visit (INDEPENDENT_AMBULATORY_CARE_PROVIDER_SITE_OTHER): Payer: Medicaid Other | Admitting: Podiatry

## 2020-06-26 ENCOUNTER — Ambulatory Visit (INDEPENDENT_AMBULATORY_CARE_PROVIDER_SITE_OTHER): Payer: Medicaid Other

## 2020-06-26 ENCOUNTER — Encounter: Payer: Self-pay | Admitting: Podiatry

## 2020-06-26 ENCOUNTER — Other Ambulatory Visit: Payer: Self-pay

## 2020-06-26 DIAGNOSIS — M722 Plantar fascial fibromatosis: Secondary | ICD-10-CM

## 2020-06-26 DIAGNOSIS — M7671 Peroneal tendinitis, right leg: Secondary | ICD-10-CM

## 2020-06-26 DIAGNOSIS — Z9889 Other specified postprocedural states: Secondary | ICD-10-CM

## 2020-06-26 DIAGNOSIS — M7750 Other enthesopathy of unspecified foot: Secondary | ICD-10-CM

## 2020-06-26 DIAGNOSIS — L905 Scar conditions and fibrosis of skin: Secondary | ICD-10-CM

## 2020-06-26 MED ORDER — TRIAMCINOLONE ACETONIDE 40 MG/ML IJ SUSP
20.0000 mg | Freq: Once | INTRAMUSCULAR | Status: AC
  Administered 2020-06-26: 20 mg

## 2020-06-27 ENCOUNTER — Other Ambulatory Visit: Payer: Self-pay | Admitting: Podiatry

## 2020-06-27 DIAGNOSIS — M7671 Peroneal tendinitis, right leg: Secondary | ICD-10-CM

## 2020-06-27 NOTE — Progress Notes (Signed)
Maureen Swanson presents today states that she has noticed knots on the plantar aspect of the foot left.  She states that I think that he missed some of these when he took very ligament.  As he tells the intake nurse.  Also states to her that those are painful.  She also goes on to say that she is having pain in the right foot over here as she points to the peroneal tendons just beneath the ankle bone.  Denies any problems.  Objective: Vital signs are stable and oriented x3 evaluation of the left foot demonstrate strong palpable pulses she does have some nodular areas at the end of the incision proximally and distally.  These are nontender on palpation.  There is no purulence no open lesions or wounds.  Contralateral foot demonstrates some strong palpable pulses peroneal tendons mildly tender beneath the fibular right.  There is no signs of infection she has good abduction against resistance and plantar flexion and eversion.  Assessment: Peroneal tendinitis.  Some residual scar tissue from previous excision of plantar fibroma.  Plan: I injected the right leg today at the ankle secondary to the peroneal tendinitis injected that with Kenalog and local anesthetic she tolerated procedure well without complications.  And I will see her back in about 6 weeks

## 2020-07-04 ENCOUNTER — Ambulatory Visit: Payer: Medicaid Other | Admitting: Internal Medicine

## 2020-08-07 ENCOUNTER — Encounter: Payer: Self-pay | Admitting: Podiatry

## 2020-08-07 ENCOUNTER — Ambulatory Visit (INDEPENDENT_AMBULATORY_CARE_PROVIDER_SITE_OTHER): Payer: Medicaid Other | Admitting: Podiatry

## 2020-08-07 ENCOUNTER — Other Ambulatory Visit: Payer: Self-pay

## 2020-08-07 DIAGNOSIS — M76821 Posterior tibial tendinitis, right leg: Secondary | ICD-10-CM

## 2020-08-07 DIAGNOSIS — M7671 Peroneal tendinitis, right leg: Secondary | ICD-10-CM

## 2020-08-07 MED ORDER — TRAMADOL HCL 50 MG PO TABS
50.0000 mg | ORAL_TABLET | Freq: Three times a day (TID) | ORAL | 0 refills | Status: AC | PRN
Start: 2020-08-07 — End: 2020-08-12

## 2020-08-07 NOTE — Progress Notes (Signed)
She presents today for follow-up of her peroneal tendinitis.  He states that the shot helped for a few days now seems to be worse than it was prior to the shot.  Objective: Vital signs are stable alert oriented x3.  Pulses are palpable.  There is no erythema edema cellulitis drainage or odor.  She has severe pain on palpation of the peroneal tendons with fluctuance within the tendons.  There is pain with abduction against resistance as well as plantar flexion and abduction or eversion against resistance.  She also has some fluctuance on palpation of the posterior tibial tendon.  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  Plan: At this point requesting MRI for diagnostic differential diagnosis as well as surgical planning.

## 2020-08-20 ENCOUNTER — Other Ambulatory Visit: Payer: Medicaid Other

## 2020-09-21 ENCOUNTER — Other Ambulatory Visit: Payer: Self-pay | Admitting: Internal Medicine

## 2020-10-09 ENCOUNTER — Other Ambulatory Visit: Payer: Self-pay | Admitting: Internal Medicine

## 2020-10-15 ENCOUNTER — Other Ambulatory Visit: Payer: Self-pay | Admitting: Internal Medicine

## 2020-10-24 NOTE — Telephone Encounter (Signed)
Patient's phone number on file is not working and her son, registered as contact, stated that he does not have contact with the patient at all.

## 2020-11-20 ENCOUNTER — Other Ambulatory Visit: Payer: Self-pay | Admitting: Internal Medicine

## 2020-12-06 ENCOUNTER — Emergency Department (HOSPITAL_COMMUNITY)
Admission: EM | Admit: 2020-12-06 | Discharge: 2020-12-06 | Disposition: A | Discharge status: Home / Self Care | Payer: Medicaid Other | Attending: Emergency Medicine | Admitting: Emergency Medicine

## 2020-12-06 ENCOUNTER — Other Ambulatory Visit: Payer: Self-pay

## 2020-12-06 ENCOUNTER — Emergency Department (HOSPITAL_COMMUNITY): Payer: Medicaid Other

## 2020-12-06 DIAGNOSIS — Z87891 Personal history of nicotine dependence: Secondary | ICD-10-CM | POA: Insufficient documentation

## 2020-12-06 DIAGNOSIS — J45909 Unspecified asthma, uncomplicated: Secondary | ICD-10-CM | POA: Diagnosis not present

## 2020-12-06 DIAGNOSIS — Z7984 Long term (current) use of oral hypoglycemic drugs: Secondary | ICD-10-CM | POA: Insufficient documentation

## 2020-12-06 DIAGNOSIS — I509 Heart failure, unspecified: Secondary | ICD-10-CM | POA: Insufficient documentation

## 2020-12-06 DIAGNOSIS — I110 Hypertensive heart disease with heart failure: Secondary | ICD-10-CM | POA: Insufficient documentation

## 2020-12-06 DIAGNOSIS — Z79899 Other long term (current) drug therapy: Secondary | ICD-10-CM | POA: Diagnosis not present

## 2020-12-06 DIAGNOSIS — W1830XA Fall on same level, unspecified, initial encounter: Secondary | ICD-10-CM | POA: Diagnosis not present

## 2020-12-06 DIAGNOSIS — E1140 Type 2 diabetes mellitus with diabetic neuropathy, unspecified: Secondary | ICD-10-CM | POA: Diagnosis not present

## 2020-12-06 DIAGNOSIS — M25562 Pain in left knee: Secondary | ICD-10-CM | POA: Diagnosis not present

## 2020-12-06 DIAGNOSIS — M25561 Pain in right knee: Secondary | ICD-10-CM | POA: Insufficient documentation

## 2020-12-06 MED ORDER — HYDROCODONE-ACETAMINOPHEN 5-325 MG PO TABS: 1.0000 | ORAL_TABLET | Freq: Three times a day (TID) | ORAL | 0 refills | Status: DC

## 2020-12-06 MED ORDER — HYDROCODONE-ACETAMINOPHEN 5-325 MG PO TABS
1.0000 | ORAL_TABLET | Freq: Once | ORAL | Status: AC
  Administered 2020-12-06: 1 via ORAL
  Filled 2020-12-06: qty 1

## 2020-12-06 NOTE — ED Provider Notes (Signed)
[facility] DEPT Provider Note   CSN: [id number] Arrival date & time: 12/06/20  1607     History No chief complaint on file.   Maureen Swanson is a 55 y.o. female.  HPI  55 year old female presents the emergency department bilateral knee pain.  Patient states just prior to arrival she had mechanical fall down onto both knees.  She had immediate pain in both knees, worse on the left.  She now has pain with weightbearing.  Denies any numbness or tingling of the lower extremities.  Did not hit her head, no loss of consciousness, no syncope.  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 x 12 yrs.   Substance abuse ([place])    crack cocaine:  extensive treatment with [facility] from 2014 to 2018 for this and alcohol abuse.   TMJ (temporomandibular joint disorder)     Patient Active Problem List   Diagnosis Date Noted   Acute gout of left foot 12/06/2019   CHF (congestive heart failure) ([place]) 08/03/2016   Hypomagnesemia    Cannabis use disorder, moderate, dependence ([place]) 11/20/2015   Homicidal ideation    Tobacco use disorder 07/20/2015   Diabetes mellitus ([place]) 07/20/2015   Cocaine use disorder, moderate, dependence ([place]) 04/03/2015   Schizoaffective disorder, bipolar type ([place]) 04/03/2015   Alcohol use disorder, moderate, dependence ([place]) 04/03/2015   Acute ischemic stroke ([place]) 12/08/2014   Left-sided weakness 12/08/2014   Gout attack 11/12/2014   Unilateral primary osteoarthritis, right knee  11/08/2014   Diabetic peripheral neuropathy ([place]) 11/04/2014   Mixed hyperlipidemia 11/04/2014   Small vessel disease, cerebrovascular 11/04/2014   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   Knee pain 10/17/2010   MAMMOGRAM, ABNORMAL, RIGHT 08/05/2010   Dyslipidemia 06/21/2010   AMENORRHEA, SECONDARY 10/29/2009   HERPES ZOSTER 08/20/2009   GERD 10/11/2007   Essential hypertension 02/26/2007    Past Surgical History:  Procedure Laterality Date   BREAST LUMPECTOMY WITH NEEDLE LOCALIZATION  04/19/2012   Procedure: BREAST LUMPECTOMY WITH NEEDLE LOCALIZATION;  Surgeon: [name], MD;  Location: [facility];  Service: General;  Laterality: Right;   KNEE ARTHROSCOPY W/ PARTIAL MEDIAL MENISCECTOMY  05/01/2010   right     OB History     Gravida  3   Para  2   Term  2   Preterm      AB  1   Living  2      SAB  1   IAB      Ectopic      Multiple      Live Births              Family History  Problem Relation Age of Onset   Diabetes Mother    Breast cancer Mother    Cancer Father  Bipolar disorder Maternal Aunt    Schizophrenia Maternal Grandfather    Alcoholism Maternal Uncle     Social History   Tobacco Use   Smoking status: Former    Packs/day: 1.00    Years: 0.00    Pack years: 0.00    Types: Cigarettes   Smokeless tobacco: Never  Substance Use Topics   Alcohol use: Yes    Comment: reports she drinks three 40 ounce beers daily   Drug use: Yes    Frequency: 5.0 times per week    Types: Cocaine, Benzodiazepines    Comment: 02/28/16    Home Medications Prior to Admission medications   Medication Sig Start Date End Date Taking? Authorizing Provider  ABILIFY MAINTENA 300 MG PRSY prefilled syringe 300 mg every 28 (twenty-eight) days.  09/06/19   [provider]  Accu-Chek Softclix Lancets lancets Check blood glucose twice daily before meals 11/23/19    Mack Hook, MD  amLODipine (NORVASC) 10 MG tablet TAKE 1 TABLET BY MOUTH ONCE DAILY. 10/18/20   Mack Hook, MD  atorvastatin (LIPITOR) 40 MG tablet TAKE (1) TABLET BY MOUTH DAILY WITH SUPPER. 10/18/20   Mack Hook, MD  benztropine (COGENTIN) 1 MG tablet Take by mouth daily.     [provider]  Blood Glucose Monitoring Suppl (ACCU-CHEK AVIVA PLUS) w/Device KIT Check blood glucose twice daily before meals 11/23/19   Mack Hook, MD  fexofenadine (ALLEGRA) 180 MG tablet TAKE 1 TABLET BY MOUTH ONCE DAILY. 10/18/20   Mack Hook, MD  gabapentin (NEURONTIN) 300 MG capsule TAKE (1) CAPSULE BY MOUTH THREE TIMES DAILY 10/18/20   Mack Hook, MD  glucose blood (ACCU-CHEK AVIVA PLUS) test strip Check blood glucose twice daily before meals 11/23/19   Mack Hook, MD  hydrochlorothiazide (HYDRODIURIL) 12.5 MG tablet TAKE 1 TABLET BY MOUTH ONCE DAILY. 11/21/20   Mack Hook, MD  INGREZZA 80 MG CAPS Take 1 capsule by mouth at bedtime. 09/06/19   [provider]  LAMICTAL 100 MG tablet Take 100 mg by mouth daily. 09/06/19   [provider]  Melatonin 3 MG CAPS Take 1 capsule by mouth at bedtime as needed. 09/06/19   [provider]  meloxicam (MOBIC) 15 MG tablet TAKE 1 TABLET BY MOUTH ONCE DAILY WITH A MEAL. 11/21/20   Mack Hook, MD  metFORMIN (GLUCOPHAGE) 1000 MG tablet TAKE 1 TABLET BY MOUTH TWICE DAILY WITH A MEAL. 10/18/20   Mack Hook, MD  mometasone (NASONEX) 50 MCG/ACT nasal spray 2 sprays each nostril daily 11/23/19   Mack Hook, MD  ondansetron (ZOFRAN) 4 MG tablet Take 1 tablet (4 mg total) by mouth every 8 (eight) hours as needed. 02/16/20   Hyatt, Max T, DPM  prazosin (MINIPRESS) 1 MG capsule Take 1 mg by mouth at bedtime. 09/06/19   [provider]  terbinafine (LAMISIL) 1 % cream Apply to feet twice daily 12/14/19   Mack Hook, MD  traZODone (DESYREL) 150 MG tablet Take 150 mg  by mouth as directed. 09/06/19   [provider]  Vitamins/Minerals TABS Take by mouth. 1 daily    [provider]    Allergies    Chocolate, Orange, Penicillins, Other, Strawberry extract, and Tomato  Review of Systems   Review of Systems  Constitutional:  Negative for fever.  Respiratory:  Negative for shortness of breath.   Cardiovascular:  Negative for chest pain.  Gastrointestinal:  Negative for abdominal pain, diarrhea and vomiting.  Genitourinary:  Negative for dysuria.  Musculoskeletal:  Negative  for back pain and neck pain.       +BL knee pain  Skin:  Negative for rash.  Neurological:  Negative for weakness, numbness and headaches.   Physical Exam Updated Vital Signs BP (!) 167/111 (BP Location: Left Arm)   Pulse 71   Resp 18   SpO2 99%   Physical Exam Vitals and nursing note reviewed.  Constitutional:      Appearance: Normal appearance.  HENT:     Head: Normocephalic.     Mouth/Throat:     Mouth: Mucous membranes are moist.  Cardiovascular:     Rate and Rhythm: Normal rate.  Pulmonary:     Effort: Pulmonary effort is normal. No respiratory distress.  Musculoskeletal:        General: Tenderness present. No swelling or deformity.     Comments: TTP b/l knee caps and with ROM  Skin:    General: Skin is warm.  Neurological:     Mental Status: She is alert and oriented to person, place, and time. Mental status is at baseline.  Psychiatric:        Mood and Affect: Mood normal.    ED Results / Procedures / Treatments   Labs (all labs ordered are listed, but only abnormal results are displayed) Labs Reviewed  POC URINE PREG, ED    EKG None  Radiology No results found.  Procedures Procedures   Medications Ordered in ED Medications  HYDROcodone-acetaminophen (NORCO/VICODIN) 5-325 MG per tablet 1 tablet (has no administration in time range)    ED Course  I have reviewed the triage vital signs and the nursing notes.  Pertinent  labs & imaging results that were available during my care of the patient were reviewed by me and considered in my medical decision making (see chart for details).    MDM Rules/Calculators/A&P                          55 year old female with mechanical fall and b/l knee pain. Initially couldn't ambulate secondary to pain. XR negative for acute fracture, does show small joint effusions and arthritis. After pain medicine patient is ambulatory.   Plan for pain control and outpatient follow up.  Patient will be discharged and treated as an outpatient.  Discharge plan and strict return to ED precautions discussed, patient verbalizes understanding and agreement.  Final Clinical Impression(s) / ED Diagnoses Final diagnoses:  None    Rx / DC Orders ED Discharge Orders     None        Lorelle Gibbs, DO 12/06/20 2053

## 2020-12-06 NOTE — ED Notes (Signed)
Pt unable to sign for discharge due to placement in hall bed. Pt expressed understanding of discharge and prescription instructions.

## 2020-12-06 NOTE — Discharge Instructions (Addendum)
You have been seen and discharged from the emergency department. Your Xrays were negative for fracture. Take pain medicine as needed.  Do not mix this medication with alcohol or other sedating medications. Do not drive or do heavy physical activity and to know how this medication affects you.  It may cause drowsiness.  Follow-up with your primary provider and orthopedics for reevaluation and further care. Take home medications as prescribed. If you have any worsening symptoms or further concerns for your health please return to an emergency department for further evaluation.

## 2020-12-06 NOTE — ED Triage Notes (Signed)
[facility] with [facility] bilateral knee pain. Pt had fall approx 1 hour ago. L knee swollen. Increased pain with bending knee. Both knees painful.  210/154 80 HR  98 O2  119 CBG

## 2021-01-01 ENCOUNTER — Emergency Department (HOSPITAL_COMMUNITY)
Admission: EM | Admit: 2021-01-01 | Discharge: 2021-01-02 | Disposition: A | Discharge status: Home / Self Care | Payer: Medicaid Other | Source: Home / Self Care | Attending: Emergency Medicine | Admitting: Emergency Medicine

## 2021-01-01 ENCOUNTER — Encounter (HOSPITAL_COMMUNITY): Payer: Self-pay

## 2021-01-01 ENCOUNTER — Other Ambulatory Visit: Payer: Self-pay

## 2021-01-01 ENCOUNTER — Emergency Department (HOSPITAL_COMMUNITY): Payer: Medicaid Other

## 2021-01-01 DIAGNOSIS — E1140 Type 2 diabetes mellitus with diabetic neuropathy, unspecified: Secondary | ICD-10-CM | POA: Insufficient documentation

## 2021-01-01 DIAGNOSIS — Z87891 Personal history of nicotine dependence: Secondary | ICD-10-CM | POA: Insufficient documentation

## 2021-01-01 DIAGNOSIS — T50902A Poisoning by unspecified drugs, medicaments and biological substances, intentional self-harm, initial encounter: Secondary | ICD-10-CM

## 2021-01-01 DIAGNOSIS — R45851 Suicidal ideations: Secondary | ICD-10-CM

## 2021-01-01 DIAGNOSIS — Y900 Blood alcohol level of less than 20 mg/100 ml: Secondary | ICD-10-CM | POA: Insufficient documentation

## 2021-01-01 DIAGNOSIS — F315 Bipolar disorder, current episode depressed, severe, with psychotic features: Secondary | ICD-10-CM | POA: Insufficient documentation

## 2021-01-01 DIAGNOSIS — Z79899 Other long term (current) drug therapy: Secondary | ICD-10-CM | POA: Insufficient documentation

## 2021-01-01 DIAGNOSIS — T43292A Poisoning by other antidepressants, intentional self-harm, initial encounter: Secondary | ICD-10-CM | POA: Insufficient documentation

## 2021-01-01 DIAGNOSIS — F1020 Alcohol dependence, uncomplicated: Secondary | ICD-10-CM | POA: Insufficient documentation

## 2021-01-01 DIAGNOSIS — Z20822 Contact with and (suspected) exposure to covid-19: Secondary | ICD-10-CM | POA: Insufficient documentation

## 2021-01-01 DIAGNOSIS — I509 Heart failure, unspecified: Secondary | ICD-10-CM | POA: Insufficient documentation

## 2021-01-01 DIAGNOSIS — X838XXA Intentional self-harm by other specified means, initial encounter: Secondary | ICD-10-CM | POA: Insufficient documentation

## 2021-01-01 DIAGNOSIS — F1420 Cocaine dependence, uncomplicated: Secondary | ICD-10-CM | POA: Insufficient documentation

## 2021-01-01 DIAGNOSIS — I110 Hypertensive heart disease with heart failure: Secondary | ICD-10-CM | POA: Insufficient documentation

## 2021-01-01 DIAGNOSIS — Z7984 Long term (current) use of oral hypoglycemic drugs: Secondary | ICD-10-CM | POA: Insufficient documentation

## 2021-01-01 DIAGNOSIS — J45909 Unspecified asthma, uncomplicated: Secondary | ICD-10-CM | POA: Insufficient documentation

## 2021-01-01 LAB — CBC WITH DIFFERENTIAL/PLATELET
Abs Immature Granulocytes: 0.02 10*3/uL (ref 0.00–0.07)
Basophils Absolute: 0 10*3/uL (ref 0.0–0.1)
Basophils Relative: 0 %
Eosinophils Absolute: 0 10*3/uL (ref 0.0–0.5)
Eosinophils Relative: 1 %
HCT: 38 % (ref 36.0–46.0)
Hemoglobin: 11.8 g/dL — ABNORMAL LOW (ref 12.0–15.0)
Immature Granulocytes: 0 %
Lymphocytes Relative: 17 %
Lymphs Abs: 1.2 10*3/uL (ref 0.7–4.0)
MCH: 28.7 pg (ref 26.0–34.0)
MCHC: 31.1 g/dL (ref 30.0–36.0)
MCV: 92.5 fL (ref 80.0–100.0)
Monocytes Absolute: 0.4 10*3/uL (ref 0.1–1.0)
Monocytes Relative: 5 %
Neutro Abs: 5.2 10*3/uL (ref 1.7–7.7)
Neutrophils Relative %: 77 %
Platelets: 184 10*3/uL (ref 150–400)
RBC: 4.11 MIL/uL (ref 3.87–5.11)
RDW: 12.3 % (ref 11.5–15.5)
WBC: 6.8 10*3/uL (ref 4.0–10.5)
nRBC: 0 % (ref 0.0–0.2)

## 2021-01-01 LAB — COMPREHENSIVE METABOLIC PANEL
ALT: 11 U/L (ref 0–44)
AST: 25 U/L (ref 15–41)
Albumin: 4 g/dL (ref 3.5–5.0)
Alkaline Phosphatase: 59 U/L (ref 38–126)
Anion gap: 8 (ref 5–15)
BUN: 16 mg/dL (ref 6–20)
CO2: 23 mmol/L (ref 22–32)
Calcium: 8.8 mg/dL — ABNORMAL LOW (ref 8.9–10.3)
Chloride: 107 mmol/L (ref 98–111)
Creatinine, Ser: 0.94 mg/dL (ref 0.44–1.00)
GFR, Estimated: >60 mL/min (ref >60)
Glucose, Bld: 105 mg/dL — ABNORMAL HIGH (ref 70–99)
Potassium: 4.3 mmol/L (ref 3.5–5.1)
Sodium: 138 mmol/L (ref 135–145)
Total Bilirubin: 0.9 mg/dL (ref 0.3–1.2)
Total Protein: 7.5 g/dL (ref 6.5–8.1)

## 2021-01-01 LAB — RESP PANEL BY RT-PCR (FLU A&B, COVID) ARPGX2
Influenza A by PCR: NEGATIVE
Influenza B by PCR: NEGATIVE
SARS Coronavirus 2 by RT PCR: NEGATIVE

## 2021-01-01 LAB — ETHANOL: Alcohol, Ethyl (B): 11 mg/dL — ABNORMAL HIGH (ref <10)

## 2021-01-01 LAB — URINALYSIS, ROUTINE W REFLEX MICROSCOPIC
Bilirubin Urine: NEGATIVE
Glucose, UA: NEGATIVE mg/dL
Hgb urine dipstick: NEGATIVE
Ketones, ur: NEGATIVE mg/dL
Leukocytes,Ua: NEGATIVE
Nitrite: NEGATIVE
Protein, ur: NEGATIVE mg/dL
Specific Gravity, Urine: 1.006 (ref 1.005–1.030)
pH: 6 (ref 5.0–8.0)

## 2021-01-01 LAB — SALICYLATE LEVEL: Salicylate Lvl: <7 mg/dL — ABNORMAL LOW (ref 7.0–30.0)

## 2021-01-01 LAB — RAPID URINE DRUG SCREEN, HOSP PERFORMED
Amphetamines: NOT DETECTED
Barbiturates: NOT DETECTED
Benzodiazepines: NOT DETECTED
Cocaine: POSITIVE — AB
Opiates: NOT DETECTED
Tetrahydrocannabinol: NOT DETECTED

## 2021-01-01 LAB — CBG MONITORING, ED
Glucose-Capillary: 107 mg/dL — ABNORMAL HIGH (ref 70–99)
Glucose-Capillary: 117 mg/dL — ABNORMAL HIGH (ref 70–99)
Glucose-Capillary: 95 mg/dL (ref 70–99)

## 2021-01-01 LAB — ACETAMINOPHEN LEVEL
Acetaminophen (Tylenol), Serum: <10 ug/mL — ABNORMAL LOW (ref 10–30)
Acetaminophen (Tylenol), Serum: <10 ug/mL — ABNORMAL LOW (ref 10–30)

## 2021-01-01 MED ORDER — ATORVASTATIN CALCIUM 40 MG PO TABS
40.0000 mg | ORAL_TABLET | Freq: Every day | ORAL | Status: DC
  Administered 2021-01-01: 40 mg via ORAL
  Filled 2021-01-01: qty 1

## 2021-01-01 MED ORDER — LAMOTRIGINE 100 MG PO TABS
100.0000 mg | ORAL_TABLET | Freq: Every day | ORAL | Status: DC
  Administered 2021-01-01 – 2021-01-02 (×2): 100 mg via ORAL
  Filled 2021-01-01 (×2): qty 1

## 2021-01-01 MED ORDER — MELATONIN 3 MG PO TABS: 3.0000 mg | ORAL_TABLET | Freq: Every evening | ORAL | Status: DC | PRN

## 2021-01-01 MED ORDER — HYDROCHLOROTHIAZIDE 12.5 MG PO CAPS
12.5000 mg | ORAL_CAPSULE | Freq: Every day | ORAL | Status: DC
  Administered 2021-01-01 – 2021-01-02 (×2): 12.5 mg via ORAL
  Filled 2021-01-01 (×2): qty 1

## 2021-01-01 MED ORDER — ADULT MULTIVITAMIN W/MINERALS CH
1.0000 | ORAL_TABLET | Freq: Every day | ORAL | Status: DC
  Administered 2021-01-02: 1 via ORAL
  Filled 2021-01-01: qty 1

## 2021-01-01 MED ORDER — SODIUM CHLORIDE 0.9 % IV BOLUS
1000.0000 mL | Freq: Once | INTRAVENOUS | Status: AC
  Administered 2021-01-01: 1000 mL via INTRAVENOUS

## 2021-01-01 MED ORDER — PRAZOSIN HCL 1 MG PO CAPS
1.0000 mg | ORAL_CAPSULE | Freq: Every day | ORAL | Status: DC
  Administered 2021-01-01: 1 mg via ORAL
  Filled 2021-01-01: qty 1

## 2021-01-01 MED ORDER — GABAPENTIN 300 MG PO CAPS
300.0000 mg | ORAL_CAPSULE | Freq: Three times a day (TID) | ORAL | Status: DC
  Administered 2021-01-01 – 2021-01-02 (×4): 300 mg via ORAL
  Filled 2021-01-01 (×4): qty 1

## 2021-01-01 MED ORDER — SODIUM CHLORIDE 0.9 % IV SOLN: INTRAVENOUS | Status: DC

## 2021-01-01 MED ORDER — METOCLOPRAMIDE HCL 10 MG PO TABS
10.0000 mg | ORAL_TABLET | Freq: Every day | ORAL | Status: DC | PRN
  Administered 2021-01-02: 10 mg via ORAL
  Filled 2021-01-01: qty 1

## 2021-01-01 MED ORDER — MELOXICAM 15 MG PO TABS
15.0000 mg | ORAL_TABLET | Freq: Every day | ORAL | Status: DC
  Administered 2021-01-02: 15 mg via ORAL
  Filled 2021-01-01: qty 1

## 2021-01-01 MED ORDER — BENZTROPINE MESYLATE 1 MG PO TABS
1.0000 mg | ORAL_TABLET | Freq: Every day | ORAL | Status: DC
  Administered 2021-01-01 – 2021-01-02 (×2): 1 mg via ORAL
  Filled 2021-01-01 (×2): qty 1

## 2021-01-01 MED ORDER — ATENOLOL 100 MG PO TABS
100.0000 mg | ORAL_TABLET | Freq: Every day | ORAL | Status: DC
  Administered 2021-01-01 – 2021-01-02 (×2): 100 mg via ORAL
  Filled 2021-01-01 (×2): qty 1

## 2021-01-01 MED ORDER — METFORMIN HCL 500 MG PO TABS
1000.0000 mg | ORAL_TABLET | Freq: Two times a day (BID) | ORAL | Status: DC
  Administered 2021-01-01 – 2021-01-02 (×3): 1000 mg via ORAL
  Filled 2021-01-01 (×3): qty 2

## 2021-01-01 MED ORDER — AMLODIPINE BESYLATE 5 MG PO TABS
5.0000 mg | ORAL_TABLET | Freq: Every day | ORAL | Status: DC
  Administered 2021-01-01 – 2021-01-02 (×2): 5 mg via ORAL
  Filled 2021-01-01 (×2): qty 1

## 2021-01-01 MED ORDER — PANTOPRAZOLE SODIUM 40 MG PO TBEC
40.0000 mg | DELAYED_RELEASE_TABLET | Freq: Every day | ORAL | Status: DC
  Administered 2021-01-01 – 2021-01-02 (×2): 40 mg via ORAL
  Filled 2021-01-01 (×2): qty 1

## 2021-01-01 MED ORDER — VALBENAZINE TOSYLATE 40 MG PO CAPS
80.0000 mg | ORAL_CAPSULE | Freq: Every day | ORAL | Status: DC
  Administered 2021-01-01: 80 mg via ORAL
  Filled 2021-01-01 (×2): qty 2

## 2021-01-01 NOTE — ED Notes (Signed)
Pt provided with [place] sandwich and a sprite.

## 2021-01-01 NOTE — ED Provider Notes (Signed)
[facility] DEPT Provider Note   CSN: [id number] Arrival date & time: 01/01/21  1012     History Chief Complaint  Patient presents with   Drug Overdose   Psychiatric Evaluation    Maureen Swanson is a 55 y.o. female.  Pt presents to the ED today with an overdose of trazodone.  The pt said she has recently broken off with her boyfriend and is homeless.  The stress has gotten to her and she wanted to kill herself today.  She took 1300 mg trazodone.  She took 1 ibuprofen 600 mg, but denies any other ingestions.  The pt took the pills around 1 hr pta.  Pt feels tired and has some nausea.        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 x 12 yrs.   Substance abuse ([place])    crack cocaine:  extensive treatment with [facility] from 2014 to 2018 for this and alcohol abuse.   TMJ (temporomandibular joint disorder)     Patient Active Problem List   Diagnosis Date Noted   Acute gout of left foot 12/06/2019   CHF (congestive heart failure) ([place]) 08/03/2016   Hypomagnesemia    Cannabis use disorder, moderate, dependence ([place]) 11/20/2015   Homicidal ideation    Tobacco use disorder 07/20/2015   Diabetes mellitus ([place]) 07/20/2015   Cocaine use disorder, moderate, dependence ([place]) 04/03/2015   Schizoaffective disorder, bipolar type ([place]) 04/03/2015   Alcohol use disorder, moderate, dependence ([place]) 04/03/2015   Acute ischemic stroke ([place]) 12/08/2014   Left-sided weakness 12/08/2014   Gout attack  11/12/2014   Unilateral primary osteoarthritis, right knee 11/08/2014   Diabetic peripheral neuropathy ([place]) 11/04/2014   Mixed hyperlipidemia 11/04/2014   Small vessel disease, cerebrovascular 11/04/2014   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   Knee pain 10/17/2010   MAMMOGRAM, ABNORMAL, RIGHT 08/05/2010   Dyslipidemia 06/21/2010   AMENORRHEA, SECONDARY 10/29/2009   HERPES ZOSTER 08/20/2009   GERD 10/11/2007   Essential hypertension 02/26/2007    Past Surgical History:  Procedure Laterality Date   BREAST LUMPECTOMY WITH NEEDLE LOCALIZATION  04/19/2012   Procedure: BREAST LUMPECTOMY WITH NEEDLE LOCALIZATION;  Surgeon: [name], MD;  Location: [facility];  Service: General;  Laterality: Right;   KNEE ARTHROSCOPY W/ PARTIAL MEDIAL MENISCECTOMY  05/01/2010   right     OB History     Gravida  3   Para  2   Term  2   Preterm      AB  1   Living  2      SAB  1   IAB      Ectopic      Multiple      Live Births  Family History  Problem Relation Age of Onset   Diabetes Mother    Breast cancer Mother    Cancer Father    Bipolar disorder Maternal Aunt    Schizophrenia Maternal Grandfather    Alcoholism Maternal Uncle     Social History   Tobacco Use   Smoking status: Former    Packs/day: 1.00    Years: 0.00    Pack years: 0.00    Types: Cigarettes   Smokeless tobacco: Never  Substance Use Topics   Alcohol use: Yes    Comment: reports she drinks three 40 ounce beers daily   Drug use: Yes    Frequency: 5.0 times per week    Types: Cocaine, Benzodiazepines    Comment: 02/28/16    Home Medications Prior to Admission medications   Medication Sig Start Date End Date Taking? Authorizing Provider  ABILIFY MAINTENA 400 MG PRSY prefilled syringe Inject 400 mg into the muscle every 30 (thirty) days. 12/28/20  Yes [provider]  amLODipine  (NORVASC) 5 MG tablet Take 5 mg by mouth daily. 12/21/20  Yes [provider]  atenolol (TENORMIN) 100 MG tablet Take 100 mg by mouth daily. 12/21/20  Yes [provider]  atorvastatin (LIPITOR) 40 MG tablet TAKE (1) TABLET BY MOUTH DAILY WITH SUPPER. 10/18/20  Yes Mack Hook, MD  benztropine (COGENTIN) 1 MG tablet Take by mouth daily.    Yes [provider]  cyclobenzaprine (FLEXERIL) 10 MG tablet Take 10 mg by mouth daily as needed for muscle spasms. 12/21/20  Yes [provider]  diclofenac Sodium (VOLTAREN) 1 % GEL Apply 1 application topically 4 (four) times daily as needed for pain. 10/09/20  Yes [provider]  fexofenadine (ALLEGRA) 180 MG tablet TAKE 1 TABLET BY MOUTH ONCE DAILY. Patient taking differently: Take 180 mg by mouth daily. 10/18/20  Yes Mack Hook, MD  gabapentin (NEURONTIN) 300 MG capsule TAKE (1) CAPSULE BY MOUTH THREE TIMES DAILY Patient taking differently: Take 300 mg by mouth 3 (three) times daily. 10/18/20  Yes Mack Hook, MD  hydrochlorothiazide (HYDRODIURIL) 12.5 MG tablet TAKE 1 TABLET BY MOUTH ONCE DAILY. Patient taking differently: Take 12.5 mg by mouth daily. 11/21/20  Yes Mack Hook, MD  ibuprofen (ADVIL) 600 MG tablet Take 600 mg by mouth 3 (three) times daily. 12/12/20  Yes [provider]  INGREZZA 80 MG CAPS Take 80 mg by mouth at bedtime. 09/06/19  Yes [provider]  LAMICTAL 100 MG tablet Take 100 mg by mouth daily. 09/06/19  Yes [provider]  Melatonin 3 MG CAPS Take 3 mg by mouth at bedtime as needed (sleep). 09/06/19  Yes [provider]  meloxicam (MOBIC) 15 MG tablet TAKE 1 TABLET BY MOUTH ONCE DAILY WITH A MEAL. Patient taking differently: Take 15 mg by mouth daily. 11/21/20  Yes Mack Hook, MD  metFORMIN (GLUCOPHAGE) 1000 MG tablet TAKE 1 TABLET BY MOUTH TWICE DAILY WITH A MEAL. Patient taking differently: Take 1,000 mg by mouth 2 (two) times  daily with a meal. 10/18/20  Yes Mack Hook, MD  metoCLOPramide (REGLAN) 10 MG tablet Take 10 mg by mouth daily as needed for nausea. 12/21/20  Yes [provider]  mometasone (NASONEX) 50 MCG/ACT nasal spray 2 sprays each nostril daily Patient taking differently: Place 2 sprays into the nose daily as needed (congestion). 11/23/19  Yes Mack Hook, MD  omeprazole (PRILOSEC) 20 MG capsule Take 20 mg by mouth daily. 12/21/20  Yes [provider]  prazosin (MINIPRESS) 1 MG capsule Take 1 mg by mouth at bedtime. 09/06/19  Yes [provider]  traZODone (DESYREL) 150 MG tablet Take 150-300 mg by mouth at bedtime as needed for sleep. 09/06/19  Yes [provider]  Vitamins/Minerals TABS Take 1 tablet by mouth daily.   Yes [provider]  Accu-Chek Softclix Lancets lancets Check blood glucose twice daily before meals 11/23/19   Mack Hook, MD  Blood Glucose Monitoring Suppl (ACCU-CHEK AVIVA PLUS) w/Device KIT Check blood glucose twice daily before meals 11/23/19   Mack Hook, MD  glucose blood (ACCU-CHEK AVIVA PLUS) test strip Check blood glucose twice daily before meals 11/23/19   Mack Hook, MD  HYDROcodone-acetaminophen (NORCO) 5-325 MG tablet Take 1 tablet by mouth every 8 (eight) hours. Patient not taking: No sig reported 12/06/20   Horton, Alvin Critchley, DO  ondansetron (ZOFRAN) 4 MG tablet Take 1 tablet (4 mg total) by mouth every 8 (eight) hours as needed. Patient not taking: No sig reported 02/16/20   Hyatt, Max T, DPM    Allergies    Chocolate, Orange, Penicillins, Chocolate flavor, Other, Penicillin g, Strawberry extract, and Tomato  Review of Systems   Review of Systems  Psychiatric/Behavioral:  Positive for suicidal ideas.   All other systems reviewed and are negative.  Physical Exam Updated Vital Signs BP (!) 177/97   Pulse 72   Temp 98 F (36.7 C) (Oral)   Resp (!) 23   Ht 5' 4" (1.626 m)   Wt 97 kg   SpO2  99%   BMI 36.71 kg/m   Physical Exam Vitals and nursing note reviewed.  Constitutional:      Appearance: Normal appearance.  HENT:     Head: Normocephalic and atraumatic.     Right Ear: External ear normal.     Left Ear: External ear normal.     Nose: Nose normal.     Mouth/Throat:     Mouth: Mucous membranes are moist.     Pharynx: Oropharynx is clear.  Eyes:     Extraocular Movements: Extraocular movements intact.     Conjunctiva/sclera: Conjunctivae normal.     Pupils: Pupils are equal, round, and reactive to light.  Cardiovascular:     Rate and Rhythm: Normal rate and regular rhythm.     Pulses: Normal pulses.     Heart sounds: Normal heart sounds.  Pulmonary:     Effort: Pulmonary effort is normal.     Breath sounds: Normal breath sounds.  Abdominal:     General: Abdomen is flat. Bowel sounds are normal.     Palpations: Abdomen is soft.  Musculoskeletal:        General: Normal range of motion.     Cervical back: Normal range of motion and neck supple.  Skin:    General: Skin is warm.     Capillary Refill: Capillary refill takes less than 2 seconds.  Neurological:     General: No focal deficit present.     Mental Status: She is alert and oriented to person, place, and time.  Psychiatric:        Mood and Affect: Mood normal.        Speech: Speech normal.        Behavior: Behavior normal.        Thought Content: Thought content includes suicidal ideation. Thought content includes suicidal plan.    ED Results / Procedures / Treatments   Labs (all labs ordered are listed, but only abnormal results  are displayed) Labs Reviewed  COMPREHENSIVE METABOLIC PANEL - Abnormal; Notable for the following components:      Result Value   Glucose, Bld 105 (*)    Calcium 8.8 (*)    All other components within normal limits  SALICYLATE LEVEL - Abnormal; Notable for the following components:   Salicylate Lvl <1.2 (*)    All other components within normal limits  ACETAMINOPHEN  LEVEL - Abnormal; Notable for the following components:   Acetaminophen (Tylenol), Serum <10 (*)    All other components within normal limits  ETHANOL - Abnormal; Notable for the following components:   Alcohol, Ethyl (B) 11 (*)    All other components within normal limits  CBC WITH DIFFERENTIAL/PLATELET - Abnormal; Notable for the following components:   Hemoglobin 11.8 (*)    All other components within normal limits  ACETAMINOPHEN LEVEL - Abnormal; Notable for the following components:   Acetaminophen (Tylenol), Serum <10 (*)    All other components within normal limits  CBG MONITORING, ED - Abnormal; Notable for the following components:   Glucose-Capillary 107 (*)    All other components within normal limits  RESP PANEL BY RT-PCR (FLU A&B, COVID) ARPGX2  RAPID URINE DRUG SCREEN, HOSP PERFORMED  URINALYSIS, ROUTINE W REFLEX MICROSCOPIC  CBG MONITORING, ED    EKG EKG Interpretation  Date/Time:  Tuesday January 01 2021 10:24:46 EDT Ventricular Rate:  65 PR Interval:  174 QRS Duration: 89 QT Interval:  423 QTC Calculation: 440 R Axis:   12 Text Interpretation: Sinus rhythm Left ventricular hypertrophy Anterior Q waves, possibly due to LVH Nonspecific T abnormalities, lateral leads No significant change since last tracing Confirmed by Isla Pence [phone]) on 01/01/2021 10:52:55 AM  Radiology DG Chest Port 1 View  Result Date: 01/01/2021 CLINICAL DATA:  Overdose EXAM: PORTABLE CHEST 1 VIEW COMPARISON:  10/31/2016 chest radiograph. FINDINGS: Stable cardiomediastinal silhouette with normal heart size. No pneumothorax. No pleural effusion. Lungs appear clear, with no acute consolidative airspace disease and no pulmonary edema. IMPRESSION: No active disease. Electronically Signed   By: Ilona Sorrel M.D.   On: 01/01/2021 11:15    Procedures Procedures   Medications Ordered in ED Medications  sodium chloride 0.9 % bolus 1,000 mL (0 mLs Intravenous Stopped 01/01/21 1431)    And   0.9 %  sodium chloride infusion ( Intravenous New Bag/Given 01/01/21 1110)  atorvastatin (LIPITOR) tablet 40 mg (has no administration in time range)  benztropine (COGENTIN) tablet 1 mg (has no administration in time range)  gabapentin (NEURONTIN) capsule 300 mg (has no administration in time range)  hydrochlorothiazide (HYDRODIURIL) tablet 12.5 mg (has no administration in time range)  valbenazine (INGREZZA) capsule 80 mg (has no administration in time range)  lamoTRIgine (LAMICTAL) tablet 100 mg (has no administration in time range)  Melatonin CAPS 3 mg (has no administration in time range)  meloxicam (MOBIC) tablet 15 mg (has no administration in time range)  metFORMIN (GLUCOPHAGE) tablet 1,000 mg (has no administration in time range)  metoCLOPramide (REGLAN) tablet 10 mg (has no administration in time range)  pantoprazole (PROTONIX) EC tablet 40 mg (has no administration in time range)  prazosin (MINIPRESS) capsule 1 mg (has no administration in time range)  Vitamins/Minerals TABS 1 tablet (has no administration in time range)    ED Course  I have reviewed the triage vital signs and the nursing notes.  Pertinent labs & imaging results that were available during my care of the patient were reviewed by me and considered  in my medical decision making (see chart for details).    MDM Rules/Calculators/A&P                          Poison control notified.  They recommend watching for 6 hrs after ingestion.  If ok, then she can be medically cleared.  4 hr tylenol level neg.  Pt has been observed for 6 hrs and is awake and alert.  She is medically clear.  TTS consult ordered.   Final Clinical Impression(s) / ED Diagnoses Final diagnoses:  Suicidal ideation  Intentional drug overdose, initial encounter [facility])    Rx / [place] Orders ED Discharge Orders     None        Isla Pence, MD 01/01/21 1517

## 2021-01-01 NOTE — ED Notes (Addendum)
Pt requesting detox resources. Pt also informed the need for as urine specimen.

## 2021-01-01 NOTE — BH Assessment (Signed)
Comprehensive Clinical Assessment (CCA) Note  01/01/2021 Maureen Swanson [id number]  Disposition: Per Pecolia Ades, FNP patient meets in patient care criteria. 1:1 sitter recommended for safety precautions.  The patient demonstrates the following risk factors for suicide: Chronic risk factors for suicide include: psychiatric disorder of bipolar I, substance use disorder, and previous suicide attempts multiple . Acute risk factors for suicide include: unemployment and loss (financial, interpersonal, professional). Protective factors for this patient include: positive therapeutic relationship. Considering these factors, the overall suicide risk at this point appears to be high. Patient is not appropriate for outpatient follow up.   Flowsheet Row ED from 01/01/2021 in [place] DEPT ED from 12/06/2020 in [place] DEPT ED from 02/03/2019 in [place] High Risk No Risk High Risk       Patient is a  year old female presenting voluntarily to [facility] following a suicide attempt by an intentional overdose on Trazedone. Patient states her boyfriend of 13 years passed away 3 days ago and she subsequently lost her housing. She is now sleeping outside or at the [employer]. She denies current SI/HI but states she felt homicidal toward people at the [facility] yesterday. She denies current AVH but states she does hear voices frequently. She states she heard a voice yesterday telling her she is worthless. She also states yesterday she had VH of alligators. Patient currently has services with [facility] but has not been taking her medications consistently for several months due to side effects. She is unable to tell me what she was prescribed. Additionally, patient reports drinking 3 40 oz beers on a daily basis and using $80-$100 worth of crack cocaine about 2 times per week. She states she has been  hospitalized at [facility] in the past but it was many years ago.    Chief Complaint:  Chief Complaint  Patient presents with   Drug Overdose   Psychiatric Evaluation   Visit Diagnosis:  Bipolar I current episode depressed, with psychosis    Alcohol use disorder, severe    Cocaine use disorder, severe   CCA Screening, Triage and Referral (STR)  Patient Reported Information How did you hear about [place]? Self  What Is the Reason for Your Visit/Call Today? suicide attempt  How Long Has This Been Causing You Problems? <Week  What Do You Feel Would Help You the Most Today? Treatment for Depression or other mood problem; Medication(s); Housing Assistance   Have You Recently Had Any Thoughts About Hurting Yourself? Yes  Are You Planning to Commit Suicide/Harm Yourself At This time? No   Have you Recently Had Thoughts About [place]? Yes  Are You Planning to Harm Someone at This Time? No  Explanation: No data recorded  Have You Used Any Alcohol or Drugs in the Past 24 Hours? Yes  How Long Ago Did You Use Drugs or Alcohol? No data recorded What Did You Use and How Much? 3 40 oz beers earlier this date, $80 worth of crack cocaine on 7.18   Do You Currently Have a Therapist/Psychiatrist? Yes  Name of Therapist/Psychiatrist: [facility]   Have You Been Recently Discharged From Any Office Practice or Programs? No  Explanation of Discharge From Practice/Program: No data recorded    CCA Screening Triage Referral Assessment Type of Contact: Face-to-Face  Telemedicine Service Delivery:   Is this Initial or Reassessment? No data recorded Date Telepsych consult ordered in [facility]:  No  data recorded Time Telepsych consult ordered in [facility]:  No data recorded Location of Assessment: [facility]  Provider Location: [facility] Assessment Services   Collateral Involvement: NA   Does Patient Have a Alda? No data recorded Name and Contact of Legal Guardian: No data  recorded If Minor and Not Living with Parent(s), Who has Custody? No data recorded Is CPS involved or ever been involved? Never  Is APS involved or ever been involved? Never   Patient Determined To Be At Risk for Harm To Self or Others Based on Review of Patient Reported Information or Presenting Complaint? Yes, for Self-Harm  Method: No data recorded Availability of Means: No data recorded Intent: No data recorded Notification Required: No data recorded Additional Information for Danger to Others Potential: No data recorded Additional Comments for Danger to Others Potential: No data recorded Are There Guns or Other Weapons in Your Home? No data recorded Types of Guns/Weapons: No data recorded Are These Weapons Safely Secured?                            No data recorded Who Could Verify You Are Able To Have These Secured: No data recorded Do You Have any Outstanding Charges, Pending Court Dates, Parole/Probation? No data recorded Contacted To Inform of Risk of Harm To Self or Others: No data recorded   Does Patient Present under Involuntary Commitment? No  IVC Papers Initial File Date: No data recorded  [place] of Residence: [place]   Patient Currently Receiving the Following Services: ACTT Architect)   Determination of Need: Emergent (2 hours)   Options For Referral: Inpatient Hospitalization     CCA Biopsychosocial Patient Reported Schizophrenia/Schizoaffective Diagnosis in Past: Yes   Strengths: compliant with ACTT services, wants treatment   Mental Health Symptoms Depression:   Change in energy/activity; Difficulty Concentrating; Hopelessness; Irritability; Increase/decrease in appetite; Sleep (too much or little); Tearfulness; Worthlessness   Duration of Depressive symptoms:    Mania:   None   Anxiety:    None   Psychosis:   Hallucinations; Delusions   Duration of Psychotic symptoms:  Duration of Psychotic Symptoms: Greater than  six months   Trauma:   None   Obsessions:   None   Compulsions:   None   Inattention:   N/A   Hyperactivity/Impulsivity:   N/A   Oppositional/Defiant Behaviors:   N/A   Emotional Irregularity:   N/A   Other Mood/Personality Symptoms:  No data recorded   Mental Status Exam Appearance and self-care  Stature:   Average   Weight:   Overweight   Clothing:   Neat/clean   Grooming:   Normal   Cosmetic use:   None   Posture/gait:   Slumped   Motor activity:   Not Remarkable   Sensorium  Attention:   Normal   Concentration:   Normal   Orientation:   X5   Recall/memory:   Normal   Affect and Mood  Affect:   Appropriate   Mood:   Depressed   Relating  Eye contact:   None   Facial expression:   Responsive   Attitude toward examiner:   Cooperative   Thought and Language  Speech flow:  Clear and Coherent   Thought content:   Appropriate to Mood and Circumstances   Preoccupation:   Suicide   Hallucinations:   Auditory; Visual   Organization:  No data recorded  [employer]  Fund of Knowledge:   Good   Intelligence:   Average   Abstraction:   Normal   Judgement:   Impaired   Reality Testing:   Realistic   Insight:   Fair   Decision Making:   Impulsive   Social Functioning  Social Maturity:   Impulsive; Irresponsible   Social Judgement:   "Street Smart"   Stress  Stressors:   Grief/losses; Housing; Teacher, music Ability:   Overwhelmed   Skill Deficits:   Decision making   Supports:   Friends/Service system     Religion: Religion/Spirituality Are You A Religious Person?: No  Leisure/Recreation: Leisure / Recreation Do You Have Hobbies?: No  Exercise/Diet: Exercise/Diet Do You Exercise?: No Have You Gained or Lost A Significant Amount of Weight in the Past Six Months?: No Do You Follow a Special Diet?: No Do You Have Any Trouble Sleeping?: No   CCA  Employment/Education Employment/Work Situation: Employment / Work Technical sales engineer: On disability Why is Patient on Disability: mental health How Long has Patient Been on Disability: UTA Patient's Job has Been Impacted by Current Illness: No Has Patient ever Been in the [employer]?: No  Education: Education Is Patient Currently Attending School?: No Last Grade Completed: 12 Did You Attend College?: No Did You Have An Individualized Education Program (IIEP): No Did You Have Any Difficulty At School?: No Patient's Education Has Been Impacted by Current Illness: No   CCA Family/Childhood History Family and Relationship History: Family history Marital status: Single Does patient have children?: Yes How many children?: 2 How is patient's relationship with their children?: adult children  Childhood History:  Childhood History By whom was/is the patient raised?: Mother Did patient suffer any verbal/emotional/physical/sexual abuse as a child?: No Did patient suffer from severe childhood neglect?: No Has patient ever been sexually abused/assaulted/raped as an adolescent or adult?: No Was the patient ever a victim of a crime or a disaster?: No Witnessed domestic violence?: No Has patient been affected by domestic violence as an adult?: No  Child/Adolescent Assessment:     CCA Substance Use Alcohol/Drug Use: Alcohol / Drug Use Pain Medications: see MAR Prescriptions: see MAR Over the Counter: see MAR History of alcohol / drug use?: Yes Substance #1 Name of Substance 1: alcohol 1 - Age of First Use: teens 1 - Amount (size/oz): 3 40 oz beers 1 - Frequency: daily 1 - Duration: 1 year 1 - Last Use / Amount: 3 40 oz beers this AM 1 - Method of Aquiring: purchased 1- Route of Use: drank Substance #2 Name of Substance 2: crack cocaine 2 - Age of First Use: 25 2 - Amount (size/oz): $80-$100 worth 2 - Frequency: 2x weekly 2 - Duration: 25 years 2 - Last Use /  Amount: 7/18 2 - Method of Aquiring: purchased 2 - Route of Substance Use: smoked                     ASAM's:  Six Dimensions of Multidimensional Assessment  Dimension 1:  Acute Intoxication and/or Withdrawal Potential:   Dimension 1:  Description of individual's past and current experiences of substance use and withdrawal: current daily substance use  Dimension 2:  Biomedical Conditions and Complications:   Dimension 2:  Description of patient's biomedical conditions and  complications: none reported  Dimension 3:  Emotional, Behavioral, or Cognitive Conditions and Complications:  Dimension 3:  Description of emotional, behavioral, or cognitive conditions and complications: bipolar d/o  Dimension 4:  Readiness  to Change:  Dimension 4:  Description of Readiness to Change criteria: precontemplation  Dimension 5:  Relapse, Continued use, or Continued Problem Potential:  Dimension 5:  Relapse, continued use, or continued problem potential critiera description: does not necessarily see issue with use  Dimension 6:  Recovery/Living Environment:  Dimension 6:  Recovery/Iiving environment criteria description: homeless  ASAM Severity Score: ASAM's Severity Rating Score: 14  ASAM Recommended Level of Treatment: ASAM Recommended Level of Treatment: Level III Residential Treatment   Substance use Disorder (SUD) Substance Use Disorder (SUD)  Checklist Symptoms of Substance Use: Continued use despite having a persistent/recurrent physical/psychological problem caused/exacerbated by use, Continued use despite persistent or recurrent social, interpersonal problems, caused or exacerbated by use, Evidence of tolerance, Evidence of withdrawal (Comment), Large amounts of time spent to obtain, use or recover from the substance(s), Persistent desire or unsuccessful efforts to cut down or control use, Presence of craving or strong urge to use, Recurrent use that results in a failure to fulfill major role  obligations (work, school, home), Repeated use in physically hazardous situations, Social, occupational, recreational activities given up or reduced due to use, Substance(s) often taken in larger amounts or over longer times than was intended  Recommendations for Services/Supports/Treatments: Recommendations for Services/Supports/Treatments Recommendations For Services/Supports/Treatments: Facility Based Crisis, Individual Therapy, Inpatient Hospitalization  Discharge Disposition:    DSM5 Diagnoses: Patient Active Problem List   Diagnosis Date Noted   Acute gout of left foot 12/06/2019   CHF (congestive heart failure) ([place]) 08/03/2016   Hypomagnesemia    Cannabis use disorder, moderate, dependence ([place]) 11/20/2015   Homicidal ideation    Tobacco use disorder 07/20/2015   Diabetes mellitus ([place]) 07/20/2015   Cocaine use disorder, moderate, dependence ([place]) 04/03/2015   Schizoaffective disorder, bipolar type ([place]) 04/03/2015   Alcohol use disorder, moderate, dependence ([place]) 04/03/2015   Acute ischemic stroke ([place]) 12/08/2014   Left-sided weakness 12/08/2014   Gout attack 11/12/2014   Unilateral primary osteoarthritis, right knee 11/08/2014   Diabetic peripheral neuropathy ([place]) 11/04/2014   Mixed hyperlipidemia 11/04/2014   Small vessel disease, cerebrovascular 11/04/2014   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

## 2021-01-01 NOTE — ED Triage Notes (Signed)
Pt reports taking 1300mg  of trazodone in SI attempt. Pt typically takes 300mg /nightly. PC called by ems and reports to watch for hypotension and bradycardia. Pt reports nausea, fatigue, dizziness, and blurry vision. EMS vitals:  HR:68 BP:200/100 Cbg:129. Hx of htn but states she has not taken her meds today.

## 2021-01-01 NOTE — BH Assessment (Addendum)
Disposition:   Per Pecolia Ades, FNP patient meets in patient care criteria. @2138 , Notified [facility] AC Maudie Mercury, RN) of patient's bed needs. Patient under review at [facility].

## 2021-01-02 ENCOUNTER — Inpatient Hospital Stay (HOSPITAL_COMMUNITY)
Admission: AD | Admit: 2021-01-02 | Discharge: 2021-01-10 | DRG: 885 | Disposition: A | Discharge status: Rehabilitation Facility | Payer: Medicaid Other | Source: Intra-hospital | Attending: Emergency Medicine | Admitting: Emergency Medicine

## 2021-01-02 ENCOUNTER — Other Ambulatory Visit: Payer: Self-pay

## 2021-01-02 ENCOUNTER — Encounter (HOSPITAL_COMMUNITY): Payer: Self-pay | Admitting: Emergency Medicine

## 2021-01-02 DIAGNOSIS — I509 Heart failure, unspecified: Secondary | ICD-10-CM | POA: Diagnosis present

## 2021-01-02 DIAGNOSIS — T39312A Poisoning by propionic acid derivatives, intentional self-harm, initial encounter: Secondary | ICD-10-CM | POA: Diagnosis present

## 2021-01-02 DIAGNOSIS — I110 Hypertensive heart disease with heart failure: Secondary | ICD-10-CM | POA: Diagnosis present

## 2021-01-02 DIAGNOSIS — J45909 Unspecified asthma, uncomplicated: Secondary | ICD-10-CM | POA: Diagnosis present

## 2021-01-02 DIAGNOSIS — Z20822 Contact with and (suspected) exposure to covid-19: Secondary | ICD-10-CM | POA: Diagnosis present

## 2021-01-02 DIAGNOSIS — F329 Major depressive disorder, single episode, unspecified: Secondary | ICD-10-CM | POA: Diagnosis present

## 2021-01-02 DIAGNOSIS — F250 Schizoaffective disorder, bipolar type: Secondary | ICD-10-CM | POA: Diagnosis present

## 2021-01-02 DIAGNOSIS — Z7984 Long term (current) use of oral hypoglycemic drugs: Secondary | ICD-10-CM

## 2021-01-02 DIAGNOSIS — Z880 Allergy status to penicillin: Secondary | ICD-10-CM

## 2021-01-02 DIAGNOSIS — Z5900 Homelessness unspecified: Secondary | ICD-10-CM

## 2021-01-02 DIAGNOSIS — Z87891 Personal history of nicotine dependence: Secondary | ICD-10-CM

## 2021-01-02 DIAGNOSIS — R001 Bradycardia, unspecified: Secondary | ICD-10-CM | POA: Diagnosis present

## 2021-01-02 DIAGNOSIS — I10 Essential (primary) hypertension: Secondary | ICD-10-CM | POA: Diagnosis not present

## 2021-01-02 DIAGNOSIS — T43212A Poisoning by selective serotonin and norepinephrine reuptake inhibitors, intentional self-harm, initial encounter: Secondary | ICD-10-CM | POA: Diagnosis present

## 2021-01-02 DIAGNOSIS — F1410 Cocaine abuse, uncomplicated: Secondary | ICD-10-CM | POA: Diagnosis present

## 2021-01-02 DIAGNOSIS — E119 Type 2 diabetes mellitus without complications: Secondary | ICD-10-CM | POA: Diagnosis present

## 2021-01-02 DIAGNOSIS — Z79899 Other long term (current) drug therapy: Secondary | ICD-10-CM

## 2021-01-02 DIAGNOSIS — K219 Gastro-esophageal reflux disease without esophagitis: Secondary | ICD-10-CM | POA: Diagnosis present

## 2021-01-02 LAB — CBG MONITORING, ED
Glucose-Capillary: 109 mg/dL — ABNORMAL HIGH (ref 70–99)
Glucose-Capillary: 124 mg/dL — ABNORMAL HIGH (ref 70–99)

## 2021-01-02 LAB — GLUCOSE, CAPILLARY: Glucose-Capillary: 145 mg/dL — ABNORMAL HIGH (ref 70–99)

## 2021-01-02 MED ORDER — LAMOTRIGINE 100 MG PO TABS
100.0000 mg | ORAL_TABLET | Freq: Every day | ORAL | Status: DC
  Administered 2021-01-03: 100 mg via ORAL
  Filled 2021-01-02 (×3): qty 1

## 2021-01-02 MED ORDER — PANTOPRAZOLE SODIUM 40 MG PO TBEC
40.0000 mg | DELAYED_RELEASE_TABLET | Freq: Every day | ORAL | Status: DC
  Administered 2021-01-03 – 2021-01-09 (×7): 40 mg via ORAL
  Filled 2021-01-02 (×8): qty 1
  Filled 2021-01-02: qty 14

## 2021-01-02 MED ORDER — MAGNESIUM HYDROXIDE 400 MG/5ML PO SUSP
30.0000 mL | Freq: Every day | ORAL | Status: DC | PRN
Start: 2021-01-02 — End: 2021-01-10
  Administered 2021-01-09: 30 mL via ORAL
  Filled 2021-01-02: qty 30

## 2021-01-02 MED ORDER — HYDROCHLOROTHIAZIDE 12.5 MG PO CAPS
12.5000 mg | ORAL_CAPSULE | Freq: Every day | ORAL | Status: DC
  Administered 2021-01-03 – 2021-01-07 (×5): 12.5 mg via ORAL
  Filled 2021-01-02 (×7): qty 1

## 2021-01-02 MED ORDER — METOCLOPRAMIDE HCL 10 MG PO TABS: 10.0000 mg | ORAL_TABLET | Freq: Every day | ORAL | Status: DC | PRN

## 2021-01-02 MED ORDER — HYDROXYZINE HCL 25 MG PO TABS
25.0000 mg | ORAL_TABLET | Freq: Three times a day (TID) | ORAL | Status: DC | PRN
  Administered 2021-01-02: 25 mg via ORAL
  Filled 2021-01-02: qty 1

## 2021-01-02 MED ORDER — PRAZOSIN HCL 1 MG PO CAPS
1.0000 mg | ORAL_CAPSULE | Freq: Every day | ORAL | Status: DC
  Administered 2021-01-02 – 2021-01-07 (×6): 1 mg via ORAL
  Filled 2021-01-02 (×9): qty 1

## 2021-01-02 MED ORDER — ACETAMINOPHEN 325 MG PO TABS: 650.0000 mg | ORAL_TABLET | Freq: Four times a day (QID) | ORAL | Status: DC | PRN

## 2021-01-02 MED ORDER — TRAZODONE HCL 50 MG PO TABS
50.0000 mg | ORAL_TABLET | Freq: Every evening | ORAL | Status: DC | PRN
  Administered 2021-01-02: 50 mg via ORAL
  Filled 2021-01-02: qty 1

## 2021-01-02 MED ORDER — WHITE PETROLATUM EX OINT
TOPICAL_OINTMENT | CUTANEOUS | Status: AC
  Filled 2021-01-02: qty 5

## 2021-01-02 MED ORDER — ADULT MULTIVITAMIN W/MINERALS CH
1.0000 | ORAL_TABLET | Freq: Every day | ORAL | Status: DC
  Administered 2021-01-03: 1 via ORAL
  Filled 2021-01-02 (×2): qty 1

## 2021-01-02 MED ORDER — VALBENAZINE TOSYLATE 40 MG PO CAPS
80.0000 mg | ORAL_CAPSULE | Freq: Every day | ORAL | Status: DC
  Administered 2021-01-02 – 2021-01-09 (×8): 80 mg via ORAL
  Filled 2021-01-02 (×10): qty 2

## 2021-01-02 MED ORDER — AMLODIPINE BESYLATE 5 MG PO TABS
5.0000 mg | ORAL_TABLET | Freq: Every day | ORAL | Status: DC
  Administered 2021-01-03 – 2021-01-06 (×4): 5 mg via ORAL
  Filled 2021-01-02 (×6): qty 1

## 2021-01-02 MED ORDER — ALUM & MAG HYDROXIDE-SIMETH 200-200-20 MG/5ML PO SUSP: 30.0000 mL | ORAL | Status: DC | PRN

## 2021-01-02 MED ORDER — BENZTROPINE MESYLATE 1 MG PO TABS
1.0000 mg | ORAL_TABLET | Freq: Every day | ORAL | Status: DC
  Administered 2021-01-03: 1 mg via ORAL
  Filled 2021-01-02 (×3): qty 1

## 2021-01-02 MED ORDER — METFORMIN HCL 500 MG PO TABS
1000.0000 mg | ORAL_TABLET | Freq: Two times a day (BID) | ORAL | Status: DC
  Administered 2021-01-03: 1000 mg via ORAL
  Filled 2021-01-02 (×5): qty 2

## 2021-01-02 MED ORDER — MELOXICAM 15 MG PO TABS
15.0000 mg | ORAL_TABLET | Freq: Every day | ORAL | Status: DC
  Administered 2021-01-03: 15 mg via ORAL
  Filled 2021-01-02 (×3): qty 1

## 2021-01-02 MED ORDER — ATENOLOL 100 MG PO TABS
100.0000 mg | ORAL_TABLET | Freq: Every day | ORAL | Status: DC
  Administered 2021-01-03: 100 mg via ORAL
  Filled 2021-01-02 (×3): qty 1

## 2021-01-02 MED ORDER — GABAPENTIN 300 MG PO CAPS
300.0000 mg | ORAL_CAPSULE | Freq: Three times a day (TID) | ORAL | Status: DC
  Administered 2021-01-02 – 2021-01-03 (×3): 300 mg via ORAL
  Filled 2021-01-02 (×9): qty 1

## 2021-01-02 MED ORDER — ATORVASTATIN CALCIUM 40 MG PO TABS
40.0000 mg | ORAL_TABLET | Freq: Every day | ORAL | Status: DC
  Administered 2021-01-02 – 2021-01-09 (×8): 40 mg via ORAL
  Filled 2021-01-02 (×3): qty 1
  Filled 2021-01-02: qty 14
  Filled 2021-01-02 (×7): qty 1

## 2021-01-02 NOTE — ED Notes (Signed)
Pt resting with eyes closed, equal rise and fall of chest. Sitter outside of room. 1:1.

## 2021-01-02 NOTE — ED Notes (Signed)
Safe Transport contacted for transfer to [facility]

## 2021-01-02 NOTE — BH Assessment (Signed)
[facility] Assessment Progress Note   Per Pecolia Ades, NP, this pt requires psychiatric hospitalization at this time.  Brook, RN, [facility] has assigned pt to [facility] Rm 403-2 to the service of Dr Nelda Marseille.  Section will be ready to receive pt at 16:00.  Pt has signed Voluntary Admission and Consent for Treatment, as well as Consent to Release Information to her sister and to the [facility] and a notification call has been placed to the latter.  Signed forms have been faxed to [facility].  EDP Charlesetta Shanks, MD and pt's nurse, Lisette Grinder, have been notified, and [facility] agrees to send original paperwork along with pt via [facility], and to call report to [phone].  Jalene Mullet, [place] Coordinator [phone]
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
## 2021-01-02 NOTE — ED Notes (Signed)
Report received from Aleene Davidson., RN.

## 2021-01-02 NOTE — Tx Team (Signed)
Initial Treatment Plan 01/02/2021 6:00 PM Maureen Swanson SCB:[id number]    PATIENT STRESSORS: Health problems Loss of boyfriend Occupational concerns Substance abuse   PATIENT STRENGTHS: Ability for insight Capable of independent living Motivation for treatment/growth Supportive family/friends   PATIENT IDENTIFIED PROBLEMS: anxiety  depression  loss  Substance use/abuse  Suicidal ideations             DISCHARGE CRITERIA:  Ability to meet basic life and health needs Medical problems require only outpatient monitoring Motivation to continue treatment in a less acute level of care Need for constant or close observation no longer present  PRELIMINARY DISCHARGE PLAN: Attend aftercare/continuing care group Attend 12-step recovery group Placement in alternative living arrangements  PATIENT/FAMILY INVOLVEMENT: This treatment plan has been presented to and reviewed with the patient, Maureen Swanson.  The patient and family have been given the opportunity to ask questions and make suggestions.  Baron Sane, RN 01/02/2021, 6:00 PM

## 2021-01-03 DIAGNOSIS — F250 Schizoaffective disorder, bipolar type: Principal | ICD-10-CM

## 2021-01-03 LAB — LIPID PANEL
Cholesterol: 211 mg/dL — ABNORMAL HIGH (ref 0–200)
HDL: 56 mg/dL (ref >40)
LDL Cholesterol: 133 mg/dL — ABNORMAL HIGH (ref 0–99)
Total CHOL/HDL Ratio: 3.8 RATIO
Triglycerides: 109 mg/dL (ref <150)
VLDL: 22 mg/dL (ref 0–40)

## 2021-01-03 LAB — HEMOGLOBIN A1C
Hgb A1c MFr Bld: 5.6 % (ref 4.8–5.6)
Mean Plasma Glucose: 114.02 mg/dL

## 2021-01-03 LAB — TSH: TSH: 0.872 u[IU]/mL (ref 0.350–4.500)

## 2021-01-03 LAB — GLUCOSE, CAPILLARY: Glucose-Capillary: 96 mg/dL (ref 70–99)

## 2021-01-03 MED ORDER — THIAMINE HCL 100 MG PO TABS
100.0000 mg | ORAL_TABLET | Freq: Every day | ORAL | Status: DC
  Administered 2021-01-04 – 2021-01-09 (×6): 100 mg via ORAL
  Filled 2021-01-03 (×10): qty 1

## 2021-01-03 MED ORDER — LOPERAMIDE HCL 2 MG PO CAPS: 2.0000 mg | ORAL_CAPSULE | ORAL | Status: AC | PRN

## 2021-01-03 MED ORDER — ADULT MULTIVITAMIN W/MINERALS CH
1.0000 | ORAL_TABLET | Freq: Every day | ORAL | Status: DC
  Administered 2021-01-03 – 2021-01-09 (×7): 1 via ORAL
  Filled 2021-01-03 (×9): qty 1

## 2021-01-03 MED ORDER — ALBUTEROL SULFATE HFA 108 (90 BASE) MCG/ACT IN AERS
1.0000 | INHALATION_SPRAY | RESPIRATORY_TRACT | Status: DC | PRN
  Filled 2021-01-03 (×2): qty 6.7

## 2021-01-03 MED ORDER — ALBUTEROL SULFATE HFA 108 (90 BASE) MCG/ACT IN AERS: 2.0000 | INHALATION_SPRAY | Freq: Four times a day (QID) | RESPIRATORY_TRACT | Status: DC | PRN

## 2021-01-03 MED ORDER — LORAZEPAM 1 MG PO TABS: 1.0000 mg | ORAL_TABLET | Freq: Four times a day (QID) | ORAL | Status: AC | PRN

## 2021-01-03 MED ORDER — ONDANSETRON 4 MG PO TBDP: 4.0000 mg | ORAL_TABLET | Freq: Four times a day (QID) | ORAL | Status: AC | PRN

## 2021-01-03 MED ORDER — HYDROXYZINE HCL 25 MG PO TABS
25.0000 mg | ORAL_TABLET | Freq: Four times a day (QID) | ORAL | Status: AC | PRN
  Administered 2021-01-03 – 2021-01-06 (×2): 25 mg via ORAL
  Filled 2021-01-03 (×2): qty 1

## 2021-01-03 MED ORDER — TRAZODONE HCL 150 MG PO TABS
150.0000 mg | ORAL_TABLET | Freq: Every evening | ORAL | Status: DC | PRN
  Administered 2021-01-03: 150 mg via ORAL
  Filled 2021-01-03: qty 1

## 2021-01-03 NOTE — Progress Notes (Signed)
PT has been alert and oriented to person, place, time and situation. Pt is calm, cooperative, pleasant, affect is flat, eye contact is poor. PT denies suicidal and homicidal ideation, denies hallucinations, denies anxiety, reports feeling depression. PT isolates in her room, napping often. PT is medication compliant, appetite is good. Will continue to monitor pt per Q15 minute face checks and monitor for safety and progress.
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
## 2021-01-03 NOTE — Progress Notes (Signed)
   01/02/21 2112  Psych Admission Type (Psych Patients Only)  Admission Status Voluntary  Psychosocial Assessment  Patient Complaints Anxiety;Depression;Sadness  Eye Contact Fair  Facial Expression Sad;Sullen  Affect Appropriate to circumstance;Sad  Speech Logical/coherent  Interaction Assertive  Motor Activity Slow  Appearance/Hygiene In scrubs  Behavior Characteristics Cooperative;Appropriate to situation  Mood Depressed;Sullen;Sad  Thought Pension scheme manager thinking  Content Blaming others  Delusions None reported or observed  Perception Hallucinations  Hallucination Auditory;Command;Visual  Judgment Poor  Confusion Mild  Danger to Self  Current suicidal ideation? Denies  Danger to Others  Danger to Others None reported or observed

## 2021-01-03 NOTE — [facility] Group Notes (Signed)
[facility] Group Notes:  (Nursing/MHT/Case Management/Adjunct)   Date:  01/03/2021  Time:  8:53 PM   Type of Therapy:  Group Therapy   Participation Level:  Did Not Attend   Participation Quality:   na   Affect:   na   Cognitive:   na   Insight:  None   Engagement in Group:   na   Modes of Intervention:   na   Summary of Progress/Problems:   Maureen Swanson 01/03/2021, 8:53 PM

## 2021-01-03 NOTE — BHH Suicide Risk Assessment (Signed)
[facility] Admission Suicide Risk Assessment   Nursing information obtained from:  Patient Demographic factors:  Unemployed, Low socioeconomic status Current Mental Status:  Suicidal ideation indicated by patient, Plan includes specific time, place, or method, Intention to act on suicide plan, Suicidal ideation indicated by others, Belief that plan would result in death, Suicide plan Loss Factors:  Loss of significant relationship, Financial problems / change in socioeconomic status, Decline in physical health Historical Factors:  Prior suicide attempts, Impulsivity Risk Reduction Factors:  Positive coping skills or problem solving skills, ACTT services  Total Time Spent in Direct Patient Care:  I personally spent 40 minutes on the unit in direct patient care. The direct patient care time included face-to-face time with the patient, reviewing the patient's chart, communicating with other professionals, and coordinating care. Greater than 50% of this time was spent in counseling or coordinating care with the patient regarding goals of hospitalization, psycho-education, and discharge planning needs.  Principal Problem: Schizoaffective disorder, bipolar type ([place]) Diagnosis:  Principal Problem:   Schizoaffective disorder, bipolar type ([place]) Active Problems:   Cocaine abuse ([place])  Subjective Data: The patient is a 55y/o female with h/o schizoaffective bipolar type followed by ACTT, who was voluntarily admitted from [facility] following a suicide attempt via intentional overdose on Trazodone and Motrin. The patient reports that her boyfriend died 5 days ago and she lost her housing which precipitated the overdose attempt. She is now homeless. She admits she has not consistently been taking her psychotropic medications prior to admission other than her monthly Abilify injection, but she is not sure when the next dose is due. She reports a previous suicide attempt via cutting in the past and reportedly was last  admitted at [facility] in 2016. She denies current SI/HI. She denies current AH but states she has been seeing visions of people in the trees outside her window in the hospital. She admits to paranoia and thought broadcasting but denies ideas of reference. She admits to smoking cocaine about twice a month with last use on Saturday. She is vague and evasive when questioned about alcohol use and denies other illicit substance use. When questioned about her previous manic/hypomanic symptoms she is vague. She states she has been more depressed recently but has trouble describing recent neurovegetative symptoms of depression on exam. See H&P for additional details.  Continued Clinical Symptoms:  Alcohol Use Disorder Identification Test Final Score (AUDIT): 11 The "Alcohol Use Disorders Identification Test", Guidelines for Use in Primary Care, Second Edition.  World Pharmacologist [facility]). Score between 0-7:  no or low risk or alcohol related problems. Score between 8-15:  moderate risk of alcohol related problems. Score between 16-19:  high risk of alcohol related problems. Score 20 or above:  warrants further diagnostic evaluation for alcohol dependence and treatment.  CLINICAL FACTORS:   Alcohol/Substance Abuse/Dependencies More than one psychiatric diagnosis Currently Psychotic Previous Psychiatric Diagnoses and Treatments   Musculoskeletal: Strength & Muscle Tone: within normal limits Gait & Station:  untested in bed Patient leans: N/A  Psychiatric Specialty Exam: Physical Exam Constitutional:      Appearance: She is obese.  HENT:     Head: Normocephalic.  Pulmonary:     Effort: Pulmonary effort is normal.  Neurological:     General: No focal deficit present.     Mental Status: She is alert.    Review of Systems - see H&P  Blood pressure (!) 147/102, pulse (!) 56, temperature 98.4 F (36.9 C), temperature source Oral, height 5'  5" (1.651 m), weight 97.1 kg, SpO2 98 %.Body mass index is  35.62 kg/m.  General Appearance:  casually dressed, fair hygiene  Eye Contact:  Minimal  Speech:  Clear and Coherent and Normal Rate  Volume:  Decreased  Mood:  Dysphoric and Irritable  Affect:  Constricted  Thought Process:  concrete, evasive, vague  Orientation:  Full (Time, Place, and Person)  Thought Content:   Admits to [facility], thought broadcasting, and paranoia; is not grossly responding to internal/external stimuli on exam  Suicidal Thoughts:   suicide attempt prior to admission but denies current SI intent or plan  Homicidal Thoughts:  No  Memory:  Recent;   Fair  Judgement:  Impaired  Insight:  Lacking  Psychomotor Activity:  Normal  Concentration:  Concentration: Fair and Attention Span: Fair  Recall:  [employer] of Knowledge:  Fair  Language:  Good  Akathisia:  Negative  Assets:  Communication Skills Desire for Improvement Resilience Social Support  ADL's:  independent  Cognition:  WNL  Sleep:  Number of Hours: 6.75   COGNITIVE FEATURES THAT CONTRIBUTE TO RISK:  Thought constriction (tunnel vision)    SUICIDE RISK:   Moderate:  Frequent suicidal ideation with limited intensity, and duration, some specificity in terms of plans, no associated intent, good self-control, limited dysphoria/symptomatology, some risk factors present, and identifiable protective factors, including available and accessible social support.  PLAN OF CARE: Patient admitted voluntarily to [facility]. Her home medications are to be verified and restarted and collateral is to be obtained from her ACTT for diagnostic clarification and understanding of her outpatient medication regimen/last LAI injection. Admission labs reviewed: TSH 0.872, Cholesterol 211, triglycerides 109, HDL 56, LDL 133, A1c 5.6, Respiratory panel negative, UA WNL, UDS positive for cocaine, WBC 6.8, H/H 11.8/38, platelets 184, tylenol <09, ETOH 11, salicylate <7, CMP WNL except for Ca+ 8.8 and glucose 105. EKG pending. Patient will also be  placed on CIWA for potential alcohol withdrawal monitoring.   I certify that inpatient services furnished can reasonably be expected to improve the patient's condition.   Harlow Asa, MD, FAPA 01/03/2021, 5:11 PM

## 2021-01-03 NOTE — Progress Notes (Signed)
   01/03/21 2113  Psych Admission Type (Psych Patients Only)  Admission Status Voluntary  Psychosocial Assessment  Patient Complaints Depression;Sadness  Eye Contact Fair  Facial Expression Sad;Sullen  Affect Appropriate to circumstance;Sad  Speech Logical/coherent  Interaction Assertive  Motor Activity Slow  Appearance/Hygiene In scrubs  Behavior Characteristics Cooperative;Appropriate to situation  Mood Depressed;Sad  Thought Pension scheme manager thinking  Content Blaming others  Delusions None reported or observed  Perception Hallucinations  Hallucination Command;Auditory;Visual  Judgment Poor  Confusion Mild  Danger to Self  Current suicidal ideation? Denies  Danger to Others  Danger to Others None reported or observed
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
## 2021-01-04 LAB — GLUCOSE, CAPILLARY
Glucose-Capillary: 120 mg/dL — ABNORMAL HIGH (ref 70–99)
Glucose-Capillary: 105 mg/dL — ABNORMAL HIGH (ref 70–99)

## 2021-01-04 MED ORDER — METFORMIN HCL 500 MG PO TABS
1000.0000 mg | ORAL_TABLET | Freq: Two times a day (BID) | ORAL | Status: DC
  Administered 2021-01-04 – 2021-01-09 (×11): 1000 mg via ORAL
  Filled 2021-01-04 (×3): qty 2
  Filled 2021-01-04: qty 56
  Filled 2021-01-04 (×2): qty 2
  Filled 2021-01-04: qty 56
  Filled 2021-01-04 (×7): qty 2

## 2021-01-04 MED ORDER — BENZTROPINE MESYLATE 1 MG PO TABS
1.0000 mg | ORAL_TABLET | Freq: Every day | ORAL | Status: DC
  Administered 2021-01-04 – 2021-01-09 (×6): 1 mg via ORAL
  Filled 2021-01-04 (×5): qty 1
  Filled 2021-01-04: qty 14
  Filled 2021-01-04 (×2): qty 1

## 2021-01-04 MED ORDER — ATENOLOL 100 MG PO TABS
100.0000 mg | ORAL_TABLET | Freq: Every day | ORAL | Status: DC
  Administered 2021-01-04 – 2021-01-06 (×3): 100 mg via ORAL
  Filled 2021-01-04 (×5): qty 1

## 2021-01-04 MED ORDER — MELATONIN 3 MG PO TABS
3.0000 mg | ORAL_TABLET | Freq: Every day | ORAL | Status: DC
  Administered 2021-01-04 – 2021-01-09 (×6): 3 mg via ORAL
  Filled 2021-01-04 (×4): qty 1
  Filled 2021-01-04: qty 14
  Filled 2021-01-04 (×2): qty 1

## 2021-01-04 MED ORDER — GABAPENTIN 300 MG PO CAPS
300.0000 mg | ORAL_CAPSULE | Freq: Three times a day (TID) | ORAL | Status: DC
  Administered 2021-01-04 – 2021-01-09 (×17): 300 mg via ORAL
  Filled 2021-01-04 (×5): qty 1
  Filled 2021-01-04: qty 42
  Filled 2021-01-04 (×12): qty 1
  Filled 2021-01-04: qty 42
  Filled 2021-01-04: qty 1
  Filled 2021-01-04: qty 42
  Filled 2021-01-04: qty 1

## 2021-01-04 MED ORDER — FLUOXETINE HCL 10 MG PO CAPS
10.0000 mg | ORAL_CAPSULE | Freq: Every day | ORAL | Status: DC
  Administered 2021-01-04 – 2021-01-06 (×3): 10 mg via ORAL
  Filled 2021-01-04 (×4): qty 1

## 2021-01-04 MED ORDER — MELOXICAM 7.5 MG PO TABS
15.0000 mg | ORAL_TABLET | Freq: Every day | ORAL | Status: DC
  Administered 2021-01-04 – 2021-01-09 (×6): 15 mg via ORAL
  Filled 2021-01-04 (×3): qty 1
  Filled 2021-01-04: qty 28
  Filled 2021-01-04 (×3): qty 1
  Filled 2021-01-04: qty 2
  Filled 2021-01-04: qty 1

## 2021-01-04 MED ORDER — WHITE PETROLATUM EX OINT: TOPICAL_OINTMENT | CUTANEOUS | Status: DC | PRN

## 2021-01-04 MED ORDER — TRAZODONE HCL 100 MG PO TABS
200.0000 mg | ORAL_TABLET | Freq: Every evening | ORAL | Status: DC | PRN
  Administered 2021-01-04 – 2021-01-09 (×6): 200 mg via ORAL
  Filled 2021-01-04 (×4): qty 2
  Filled 2021-01-04: qty 28
  Filled 2021-01-04 (×2): qty 2

## 2021-01-04 NOTE — Progress Notes (Signed)
CSW provided patient with resources for people who experience homelessness.  CSW also informed patient of acceptance to [facility] program for Monday, 7/25.    Dejanira Pamintuan, LCSW, [place] Social Worker  [facility]

## 2021-01-04 NOTE — BHH Counselor (Signed)
CSW provided the patient with a packet of resources containing information about: housing and shelter listings, free and reduced price food resources, [employer] information, [place] cards, and suicide prevention information.

## 2021-01-04 NOTE — Progress Notes (Addendum)
[facility] MD Progress Note  01/04/2021 2:32 PM Maureen Swanson  MRN:  [id number] Subjective:    Maureen Swanson is a 77 YOF with a PPHx of Schizoaffective disorder, bipolar type, as well as MDD, presenting voluntarily after a suicide attempt via overdose.   24 hr events: no documented behavioral issues, no PRN medications given for agitation.  On interview this morning, patient reports feeling depressed. She reports seeing bugs crawling all over the floor and feels like people are coming up behind her. She reports that these visual hallucinations bother her significantly. Notably she denies having any SI this morning and denies any AH. She reports vomiting one time last night, believes it was caused by something she ate. Patient informed she has PRN Zofran ordered.  She denies diarrhea and is afebrile. She is amenable to starting an antidepressant today.   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 x 12 yrs.   Substance abuse ([place])    crack cocaine:  extensive treatment with [employer] from 2014 to 2018 for this and alcohol abuse.   TMJ (temporomandibular joint disorder)     Past Surgical History:  Procedure Laterality  Date   BREAST LUMPECTOMY WITH NEEDLE LOCALIZATION  04/19/2012   Procedure: BREAST LUMPECTOMY WITH NEEDLE LOCALIZATION;  Surgeon: [name], MD;  Location: [place];  Service: General;  Laterality: Right;   KNEE ARTHROSCOPY W/ PARTIAL MEDIAL MENISCECTOMY  05/01/2010   right   Family History:  Family History  Problem Relation Age of Onset   Diabetes Mother    Breast cancer Mother    Cancer Father    Bipolar disorder Maternal Aunt    Schizophrenia Maternal Grandfather    Alcoholism Maternal Uncle    Family Psychiatric  History: See H and P Social History: previously reported by patient, different than current assessment. On that assessment patient admitted to more significant drug use. Social History   Substance and Sexual Activity  Alcohol Use Yes   Comment: 3x 40 oz beers/day     Social History   Substance and Sexual Activity  Drug Use Yes   Frequency: 5.0 times per week   Types: Cocaine, "Crack" cocaine    Social History   Socioeconomic History   Marital status: Single    Spouse name: Not on file   Number of children: Not on file   Years of education: Not on file   Highest education level: Not on file  Occupational History   Not on file  Tobacco Use   Smoking status: Former    Packs/day: 1.00    Years: 0.00  Pack years: 0.00    Types: Cigarettes   Smokeless tobacco: Never  Vaping Use   Vaping Use: Never used  Substance and Sexual Activity   Alcohol use: Yes    Comment: 3x 40 oz beers/day   Drug use: Yes    Frequency: 5.0 times per week    Types: Cocaine, "Crack" cocaine   Sexual activity: Not Currently    Birth control/protection: Post-menopausal  Other Topics Concern   Not on file  Social History Narrative   Not on file   Social Determinants of Health   Financial Resource Strain: Not on file  Food Insecurity: Not on file  Transportation Needs: Not on file  Physical Activity: Not on file  Stress: Not on file  Social  Connections: Not on file   Additional Social History:    She reports doing illegal drugs (mainly cocaine) 2x per month and drinking one "40 oz" per day.   Sleep: Poor  Appetite:  Good  Current Medications: Current Facility-Administered Medications  Medication Dose Route Frequency Provider Last Rate Last Admin   acetaminophen (TYLENOL) tablet 650 mg  650 mg Oral Q6H PRN Chalmers Guest, NP       albuterol (VENTOLIN HFA) 108 (90 Base) MCG/ACT inhaler 1 puff  1 puff Inhalation Q4H PRN Nwoko, Agnes I, NP       alum & mag hydroxide-simeth (MAALOX/MYLANTA) 200-200-20 MG/5ML suspension 30 mL  30 mL Oral Q4H PRN Chalmers Guest, NP       amLODipine (NORVASC) tablet 5 mg  5 mg Oral Daily Chalmers Guest, NP   5 mg at 01/04/21 0741   atenolol (TENORMIN) tablet 100 mg  100 mg Oral Daily Nelda Marseille, [place] Gadbois E, MD   100 mg at 01/04/21 1326   atorvastatin (LIPITOR) tablet 40 mg  40 mg Oral QHS Chalmers Guest, NP   40 mg at 01/03/21 2113   benztropine (COGENTIN) tablet 1 mg  1 mg Oral QHS Darlin Stenseth E, MD       gabapentin (NEURONTIN) capsule 300 mg  300 mg Oral TID Nelda Marseille, Holston Oyama E, MD   300 mg at 01/04/21 1325   hydrochlorothiazide (MICROZIDE) capsule 12.5 mg  12.5 mg Oral Daily Chalmers Guest, NP   12.5 mg at 01/04/21 [id number]   hydrOXYzine (ATARAX/VISTARIL) tablet 25 mg  25 mg Oral Q6H PRN Harlow Asa, MD   25 mg at 01/03/21 2113   loperamide (IMODIUM) capsule 2-4 mg  2-4 mg Oral PRN Harlow Asa, MD       LORazepam (ATIVAN) tablet 1 mg  1 mg Oral Q6H PRN Nelda Marseille, Kyree Fedorko E, MD       magnesium hydroxide (MILK OF MAGNESIA) suspension 30 mL  30 mL Oral Daily PRN Chalmers Guest, NP       melatonin tablet 3 mg  3 mg Oral QHS Nelda Marseille, Pepper Kerrick E, MD       meloxicam (MOBIC) tablet 15 mg  15 mg Oral Daily Nelda Marseille, Infant Doane E, MD   15 mg at 01/04/21 1325   metFORMIN (GLUCOPHAGE) tablet 1,000 mg  1,000 mg Oral BID WC Harlow Asa, MD       multivitamin with minerals tablet 1 tablet  1 tablet Oral Daily  Viann Fish E, MD   1 tablet at 01/04/21 0741   ondansetron (ZOFRAN-ODT) disintegrating tablet 4 mg  4 mg Oral Q6H PRN Harlow Asa, MD       pantoprazole (PROTONIX) EC tablet 40 mg  40  mg Oral Daily Chalmers Guest, NP   40 mg at 01/04/21 0741   prazosin (MINIPRESS) capsule 1 mg  1 mg Oral QHS Chalmers Guest, NP   1 mg at 01/03/21 2113   thiamine tablet 100 mg  100 mg Oral Daily Viann Fish E, MD   100 mg at 01/04/21 0741   traZODone (DESYREL) tablet 150 mg  150 mg Oral QHS PRN Corky Sox, MD   150 mg at 01/03/21 2113   valbenazine (INGREZZA) capsule 80 mg  80 mg Oral QHS Chalmers Guest, NP   80 mg at 01/03/21 2113    Lab Results:  Results for orders placed or performed during the hospital encounter of 01/02/21 (from the past 48 hour(s))  Glucose, capillary     Status: Abnormal   Collection Time: 01/02/21  8:43 PM  Result Value Ref Range   Glucose-Capillary 145 (H) 70 - 99 mg/dL    Comment: Glucose reference range applies only to samples taken after fasting for at least 8 hours.  Glucose, capillary     Status: None   Collection Time: 01/03/21  6:20 AM  Result Value Ref Range   Glucose-Capillary 96 70 - 99 mg/dL    Comment: Glucose reference range applies only to samples taken after fasting for at least 8 hours.  Hemoglobin A1c     Status: None   Collection Time: 01/03/21  6:30 AM  Result Value Ref Range   Hgb A1c MFr Bld 5.6 4.8 - 5.6 %    Comment: (NOTE) Pre diabetes:          5.7%-6.4%  Diabetes:              >6.4%  Glycemic control for   <7.0% adults with diabetes    Mean Plasma Glucose 114.02 mg/dL    Comment: Performed at [facility] [address], [place], [place] [address]  Lipid panel     Status: Abnormal   Collection Time: 01/03/21  6:30 AM  Result Value Ref Range   Cholesterol 211 (H) 0 - 200 mg/dL   Triglycerides 109 <150 mg/dL   HDL 56 >40 mg/dL   Total CHOL/HDL Ratio 3.8 RATIO   VLDL 22 0 - 40 mg/dL   LDL Cholesterol 133 (H) 0 - 99 mg/dL     Comment:        Total Cholesterol/HDL:CHD Risk Coronary Heart Disease Risk Table                     Men   Women  1/2 Average Risk   3.4   3.3  Average Risk       5.0   4.4  2 X Average Risk   9.6   7.1  3 X Average Risk  23.4   11.0        Use the calculated Patient Ratio above and the CHD Risk Table to determine the patient's CHD Risk.        ATP III CLASSIFICATION (LDL):  <100     mg/dL   Optimal  100-129  mg/dL   Near or Above                    Optimal  130-159  mg/dL   Borderline  160-189  mg/dL   High  >190     mg/dL   Very High Performed at [facility] [address], [place], [place] [address]   TSH  Status: None   Collection Time: 01/03/21  6:30 AM  Result Value Ref Range   TSH 0.872 0.350 - 4.500 uIU/mL    Comment: Performed by a 3rd Generation assay with a functional sensitivity of <=0.01 uIU/mL. Performed at [facility], [place] [address], [place], [place] [address]   Glucose, capillary     Status: Abnormal   Collection Time: 01/04/21  6:18 AM  Result Value Ref Range   Glucose-Capillary 120 (H) 70 - 99 mg/dL    Comment: Glucose reference range applies only to samples taken after fasting for at least 8 hours.    Blood Alcohol level:  Lab Results  Component Value Date   ETH 11 (H) 01/01/2021   ETH <5 [id number]    Metabolic Disorder Labs: Lab Results  Component Value Date   HGBA1C 5.6 01/03/2021   MPG 114.02 01/03/2021   MPG 100 11/20/2015   Lab Results  Component Value Date   PROLACTIN 5.7 08/10/2015   PROLACTIN 141.3 (H) 07/21/2015   Lab Results  Component Value Date   CHOL 211 (H) 01/03/2021   TRIG 109 01/03/2021   HDL 56 01/03/2021   CHOLHDL 3.8 01/03/2021   VLDL 22 01/03/2021   LDLCALC 133 (H) 01/03/2021   LDLCALC 135 (H) 11/23/2019    Physical Findings: AIMS: Facial and Oral Movements Muscles of Facial Expression: None, normal Lips and Perioral Area: None, normal Jaw: None,  normal Tongue: None, normal,Extremity Movements Upper (arms, wrists, hands, fingers): None, normal Lower (legs, knees, ankles, toes): None, normal, Trunk Movements Neck, shoulders, hips: None, normal, Overall Severity Severity of abnormal movements (highest score from questions above): None, normal Incapacitation due to abnormal movements: None, normal Patient's awareness of abnormal movements (rate only patient's report): No Awareness, Dental Status Current problems with teeth and/or dentures?: No Does patient usually wear dentures?: No  CIWA:  CIWA-Ar Total: 0 COWS:     Musculoskeletal: Strength & Muscle Tone: within normal limits Gait & Station: normal Patient leans: N/A  Psychiatric Specialty Exam:  Presentation  General Appearance: Casual  Eye Contact:Fair  Speech:Slow, coherent and fluent  Speech Volume:Normal  Handedness:Right   Mood and Affect  Mood:Depressed  Affect:Congruent   Thought Process  Thought Processes:Coherent; Linear  Descriptions of Associations:Intact  Orientation:Full (Time, Place and Person)  Thought Content:Residual AVH but denies ideas of reference or first rank symptoms; residual paranoia  History of Schizophrenia/Schizoaffective disorder:Yes  Duration of Psychotic Symptoms:Greater than six months  Hallucinations:Hallucinations: Auditory; Command; Visual Description of Command Hallucinations: Voices telling her to self harm Description of Auditory Hallucinations: AS above Description of Visual Hallucinations: Bugs crawling on the floor  Ideas of Reference:None  Suicidal Thoughts:Denied Homicidal Thoughts:Homicidal Thoughts: No   Sensorium  Memory:Immediate Fair; Recent Fair; Remote Fair  Judgment:Poor  Insight:Poor   Executive Functions  Concentration:Fair  Attention Span:Fair  [place]   Psychomotor Activity  Psychomotor Activity:Psychomotor Activity:  Normal   Assets  Assets:Financial Resources/Insurance   Sleep  Sleep:Sleep: Fair Number of Hours of Sleep: 5.75    Physical Exam: Physical Exam Constitutional:      Appearance: Normal appearance.  HENT:     Head: Normocephalic and atraumatic.  Cardiovascular:     Rate and Rhythm: Normal rate.  Pulmonary:     Effort: Pulmonary effort is normal.  Neurological:     General: No focal deficit present.     Mental Status: She is alert.   Review of Systems  Respiratory:  Negative for shortness  of breath.   Cardiovascular:  Negative for chest pain.  Gastrointestinal:  Positive for vomiting. Negative for diarrhea.  Blood pressure (!) 155/79, pulse 60, temperature 98.5 F (36.9 C), temperature source Oral, resp. rate 18, height '5\' 5"'$  (1.651 m), weight 97.1 kg, SpO2 97 %. Body mass index is 35.62 kg/m.   Safety and Monitoring -- VOLUNTARY admission to inpatient psychiatric unit for safety, stabilization and treatment -- Daily contact with patient to assess and evaluate symptoms and progress in treatment -- Patient's case to be discussed in multi-disciplinary team meeting -- Observation Level : q15 minute checks -- Vital signs:  q12 hours -- Precautions: suicide   Schizoaffective d/o bipolar type by hx -Abilify maintena 400 mg IM given June 30th, next due July 29th Antipsychotic labs             EKG: NSR, Qtc 440 with LVH             Lipids: Chol 211, LDL 133             A1C: 5.6 AIMS: 0, no rigidity or cogwheeling -Continue Ingrezza 80 mg daily for previous TD -Start Prozac 10 mg daily (7/22), pt counseled on r/b/se - monitor for mood escalation with start of antidepressant -Increase Trazodone to 200 mg for sleep and restarted home dose of Melatonin '3mg'$  po qhs - Restarted home dose of Congentin '1mg'$  qhs - continue Prazosin 1 mg for nightmares - Restarted home dose of Neurontin '300mg'$  tid - Holding home dose of Lamictal since patient cannot say if she has been compliant with  dosing    Stimulant Use d/o - cocaine type Alcohol Use Disorder -CIWA: 1 mg Ativan q6hr PRN for score >10, patient currently scoring 0 -multivitamin and thiamine oral replacement - counseled on need to abstain from illicit substances and social work looking into [employer] residential at discharge   Medical Management CBC: with mild anemia of 11.8 CMP: unremarkable EtOH of 11 mg/dL UDS with cocaine CBG <150 TSH 0.87  Type 2 DM FSBS qAC and daily in the AM Continue MF 1,000 mg bid (glipizide is also a home med but patient is unsure if she has taken it previously - hold for now)  HTN Currently hypertensive, continue to monitor BP Continue Norvasc 5 mg, atenolol 100 mg, and HCTZ 12.5 mg  Miscellaneous -Continue albuterol for asthma, lipitor for HLD, Protonix for GERD, Vaseline for dry lips and legs   Continue PRN's: Tylenol, Maalox, Atarax, Milk of Magnesia, imodium, and [place] PGY-1, Psychiatry

## 2021-01-04 NOTE — [facility] Group Notes (Signed)
Type of Group and Topic: Psychoeducational Group: Discharge Planning   Participation Level: Did not attend    Description of Group   Discharge planning group reviews patient's anticipated discharge plans and assists patients to anticipate and address any barriers to wellness/recovery in the community. Suicide prevention education is reviewed with patients in group.   Therapeutic Goals   1. Patients will state their anticipated discharge plan and mental health aftercare   2. Patients will identify potential barriers to wellness in the community setting   3. Patients will engage in problem solving, solution focused discussion of ways to anticipate and address barriers to wellness/recovery   Summary of Patient Progress: Did not attend

## 2021-01-04 NOTE — Progress Notes (Signed)
Patient has been accepted to [facility] for 28 day program.  Appointment set up for door to door service for admit at [place] on Monday.    Katura Eatherly, LCSW, [place] Social Worker  [facility]

## 2021-01-04 NOTE — Tx Team (Signed)
Interdisciplinary Treatment and Diagnostic Plan Update  01/04/2021 Time of Session: 9:30am Maureen Swanson MRN: [id number]  Principal Diagnosis: Schizoaffective disorder, bipolar type [facility])  Secondary Diagnoses: Principal Problem:   Schizoaffective disorder, bipolar type ([place]) Active Problems:   Cocaine abuse ([place])   Current Medications:  Current Facility-Administered Medications  Medication Dose Route Frequency Provider Last Rate Last Admin   acetaminophen (TYLENOL) tablet 650 mg  650 mg Oral Q6H PRN Chalmers Guest, NP       albuterol (VENTOLIN HFA) 108 (90 Base) MCG/ACT inhaler 1 puff  1 puff Inhalation Q4H PRN Nwoko, Herbert Pun I, NP       alum & mag hydroxide-simeth (MAALOX/MYLANTA) 200-200-20 MG/5ML suspension 30 mL  30 mL Oral Q4H PRN Chalmers Guest, NP       amLODipine (NORVASC) tablet 5 mg  5 mg Oral Daily Chalmers Guest, NP   5 mg at 01/04/21 0741   atorvastatin (LIPITOR) tablet 40 mg  40 mg Oral QHS Chalmers Guest, NP   40 mg at 01/03/21 2113   hydrochlorothiazide (MICROZIDE) capsule 12.5 mg  12.5 mg Oral Daily Chalmers Guest, NP   12.5 mg at 01/04/21 6759   hydrOXYzine (ATARAX/VISTARIL) tablet 25 mg  25 mg Oral Q6H PRN Harlow Asa, MD   25 mg at 01/03/21 2113   loperamide (IMODIUM) capsule 2-4 mg  2-4 mg Oral PRN Harlow Asa, MD       LORazepam (ATIVAN) tablet 1 mg  1 mg Oral Q6H PRN Nelda Marseille, Amy E, MD       magnesium hydroxide (MILK OF MAGNESIA) suspension 30 mL  30 mL Oral Daily PRN Chalmers Guest, NP       multivitamin with minerals tablet 1 tablet  1 tablet Oral Daily Nelda Marseille, Amy E, MD   1 tablet at 01/04/21 0741   ondansetron (ZOFRAN-ODT) disintegrating tablet 4 mg  4 mg Oral Q6H PRN Harlow Asa, MD       pantoprazole (PROTONIX) EC tablet 40 mg  40 mg Oral Daily Chalmers Guest, NP   40 mg at 01/04/21 0741   prazosin (MINIPRESS) capsule 1 mg  1 mg Oral QHS Chalmers Guest, NP   1 mg at 01/03/21 2113   thiamine tablet 100 mg  100 mg Oral Daily Nelda Marseille, Amy E,  MD   100 mg at 01/04/21 0741   traZODone (DESYREL) tablet 150 mg  150 mg Oral QHS PRN Corky Sox, MD   150 mg at 01/03/21 2113   valbenazine (INGREZZA) capsule 80 mg  80 mg Oral QHS Chalmers Guest, NP   80 mg at 01/03/21 2113   PTA Medications: Medications Prior to Admission  Medication Sig Dispense Refill Last Dose   ABILIFY MAINTENA 400 MG PRSY prefilled syringe Inject 400 mg into the muscle every 30 (thirty) days.      Accu-Chek Softclix Lancets lancets Check blood glucose twice daily before meals 100 each 12    amLODipine (NORVASC) 5 MG tablet Take 5 mg by mouth daily.      atenolol (TENORMIN) 100 MG tablet Take 100 mg by mouth daily.      atorvastatin (LIPITOR) 40 MG tablet TAKE (1) TABLET BY MOUTH DAILY WITH SUPPER. 28 tablet 2    benztropine (COGENTIN) 1 MG tablet Take by mouth daily.       Blood Glucose Monitoring Suppl (ACCU-CHEK AVIVA PLUS) w/Device KIT Check blood glucose twice daily before meals 1 kit 0    cyclobenzaprine (  FLEXERIL) 10 MG tablet Take 10 mg by mouth daily as needed for muscle spasms.      diclofenac Sodium (VOLTAREN) 1 % GEL Apply 1 application topically 4 (four) times daily as needed for pain.      fexofenadine (ALLEGRA) 180 MG tablet TAKE 1 TABLET BY MOUTH ONCE DAILY. (Patient taking differently: Take 180 mg by mouth daily.) 28 tablet 2    gabapentin (NEURONTIN) 300 MG capsule TAKE (1) CAPSULE BY MOUTH THREE TIMES DAILY (Patient taking differently: Take 300 mg by mouth 3 (three) times daily.) 84 capsule 2    glucose blood (ACCU-CHEK AVIVA PLUS) test strip Check blood glucose twice daily before meals 100 each 12    hydrochlorothiazide (HYDRODIURIL) 12.5 MG tablet TAKE 1 TABLET BY MOUTH ONCE DAILY. (Patient taking differently: Take 12.5 mg by mouth daily.) 28 tablet 2    HYDROcodone-acetaminophen (NORCO) 5-325 MG tablet Take 1 tablet by mouth every 8 (eight) hours. (Patient not taking: No sig reported) 5 tablet 0    ibuprofen (ADVIL) 600 MG tablet Take 600 mg by  mouth 3 (three) times daily.      INGREZZA 80 MG CAPS Take 80 mg by mouth at bedtime.      LAMICTAL 100 MG tablet Take 100 mg by mouth daily.      Melatonin 3 MG CAPS Take 3 mg by mouth at bedtime as needed (sleep).      meloxicam (MOBIC) 15 MG tablet TAKE 1 TABLET BY MOUTH ONCE DAILY WITH A MEAL. (Patient taking differently: Take 15 mg by mouth daily.) 28 tablet 2    metFORMIN (GLUCOPHAGE) 1000 MG tablet TAKE 1 TABLET BY MOUTH TWICE DAILY WITH A MEAL. (Patient taking differently: Take 1,000 mg by mouth 2 (two) times daily with a meal.) 56 tablet 2    metoCLOPramide (REGLAN) 10 MG tablet Take 10 mg by mouth daily as needed for nausea.      mometasone (NASONEX) 50 MCG/ACT nasal spray 2 sprays each nostril daily (Patient taking differently: Place 2 sprays into the nose daily as needed (congestion).) 17 g 12    omeprazole (PRILOSEC) 20 MG capsule Take 20 mg by mouth daily.      ondansetron (ZOFRAN) 4 MG tablet Take 1 tablet (4 mg total) by mouth every 8 (eight) hours as needed. (Patient not taking: No sig reported) 20 tablet 0    prazosin (MINIPRESS) 1 MG capsule Take 1 mg by mouth at bedtime.      traZODone (DESYREL) 150 MG tablet Take 150-300 mg by mouth at bedtime as needed for sleep.      Vitamins/Minerals TABS Take 1 tablet by mouth daily.       Patient Stressors: Health problems Loss of boyfriend Occupational concerns Substance abuse  Patient Strengths: Ability for insight Capable of independent living Motivation for treatment/growth Supportive family/friends  Treatment Modalities: Medication Management, Group therapy, Case management,  1 to 1 session with clinician, Psychoeducation, Recreational therapy.   Physician Treatment Plan for Primary Diagnosis: Schizoaffective disorder, bipolar type ([place]) Long Term Goal(s): Improvement in symptoms so as ready for discharge   Short Term Goals: Ability to identify changes in lifestyle to reduce recurrence of condition will improve Ability  to demonstrate self-control will improve Ability to identify and develop effective coping behaviors will improve Ability to maintain clinical measurements within normal limits will improve Compliance with prescribed medications will improve  Medication Management: Evaluate patient's response, side effects, and tolerance of medication regimen.  Therapeutic Interventions: 1 to 1 sessions, Unit  Group sessions and Medication administration.  Evaluation of Outcomes: Not Met  Physician Treatment Plan for Secondary Diagnosis: Principal Problem:   Schizoaffective disorder, bipolar type ([place]) Active Problems:   Cocaine abuse ([place])  Long Term Goal(s): Improvement in symptoms so as ready for discharge   Short Term Goals: Ability to identify changes in lifestyle to reduce recurrence of condition will improve Ability to demonstrate self-control will improve Ability to identify and develop effective coping behaviors will improve Ability to maintain clinical measurements within normal limits will improve Compliance with prescribed medications will improve     Medication Management: Evaluate patient's response, side effects, and tolerance of medication regimen.  Therapeutic Interventions: 1 to 1 sessions, Unit Group sessions and Medication administration.  Evaluation of Outcomes: Not Met   RN Treatment Plan for Primary Diagnosis: Schizoaffective disorder, bipolar type ([place]) Long Term Goal(s): Knowledge of disease and therapeutic regimen to maintain health will improve  Short Term Goals: Ability to demonstrate self-control, Ability to participate in decision making will improve, and Ability to verbalize feelings will improve  Medication Management: RN will administer medications as ordered by provider, will assess and evaluate patient's response and provide education to patient for prescribed medication. RN will report any adverse and/or side effects to prescribing provider.  Therapeutic  Interventions: 1 on 1 counseling sessions, Psychoeducation, Medication administration, Evaluate responses to treatment, Monitor vital signs and CBGs as ordered, Perform/monitor CIWA, COWS, AIMS and Fall Risk screenings as ordered, Perform wound care treatments as ordered.  Evaluation of Outcomes: Not Met   LCSW Treatment Plan for Primary Diagnosis: Schizoaffective disorder, bipolar type ([place]) Long Term Goal(s): Safe transition to appropriate next level of care at discharge, Engage patient in therapeutic group addressing interpersonal concerns.  Short Term Goals: Engage patient in aftercare planning with referrals and resources, Increase ability to appropriately verbalize feelings, and Increase emotional regulation  Therapeutic Interventions: Assess for all discharge needs, 1 to 1 time with Social worker, Explore available resources and support systems, Assess for adequacy in community support network, Educate family and significant other(s) on suicide prevention, Complete Psychosocial Assessment, Interpersonal group therapy.  Evaluation of Outcomes: Not Met   Progress in Treatment: Attending groups: No. Participating in groups: No. Taking medication as prescribed: Yes. Toleration medication: Yes. Family/Significant other contact made: No, will contact:  son or sister Patient understands diagnosis: No. Discussing patient identified problems/goals with staff: Yes. Medical problems stabilized or resolved: Yes. Denies suicidal/homicidal ideation: Yes. Issues/concerns per patient self-inventory: No. Other: None  New problem(s) identified: No, Describe:  None  New Short Term/Long Term Goal(s):Patient recently admitted. CSW will continue to follow and assess for appropriate referrals and possible discharge planning.   Patient Goals:  "to get better, not so depressed and take medications."  Discharge Plan or Barriers: medication stabilization, elimination of SI thoughts, development of  comprehensive mental wellness plan.   Reason for Continuation of Hospitalization: Homicidal ideation Medication stabilization Suicidal ideation  Estimated Length of Stay: 3-5 days  Attendees: Patient: Maureen Swanson 01/04/2021   Physician: Fatima Sanger, MD 01/04/2021   Nursing:  01/04/2021   RN Care Manager: 01/04/2021   Social Worker: Toney Reil, Latanya Presser 01/04/2021   Recreational Therapist:  01/04/2021   Other:  01/04/2021   Other:  01/04/2021   Other: 01/04/2021     Scribe for Treatment Team: Mliss Fritz, Latanya Presser 01/04/2021 11:34 AM

## 2021-01-04 NOTE — Progress Notes (Signed)
Recreation Therapy Notes  Date:  7.22.22 Time: 0930 Location: [address] Dayroom  Group Topic: Stress Management  Goal Area(s) Addresses:  Patient will identify positive stress management techniques. Patient will identify benefits of using stress management post d/c.  Intervention: Stress Management  Activity :  Meditation.  LRT played a meditation that focused on setting personal boundaries even if that makes others upset or uncomfortable.  Education:  Stress Management, Discharge Planning.   Education Outcome: Acknowledges Education  Clinical Observations/Feedback: Pt did not attend group session.    Victorino Sparrow, LRT/CTRS         Victorino Sparrow A 01/04/2021 11:52 AM

## 2021-01-04 NOTE — Progress Notes (Signed)
Inpatient Diabetes Program Recommendations  AACE/ADA: New Consensus Statement on Inpatient Glycemic Control (2015)  Target Ranges:  Prepandial:   less than 140 mg/dL      Peak postprandial:   less than 180 mg/dL (1-2 hours)      Critically ill patients:  140 - 180 mg/dL   Lab Results  Component Value Date   GLUCAP 120 (H) 01/04/2021   HGBA1C 5.6 01/03/2021    Review of Glycemic Control  Diabetes history: DM2 Outpatient Diabetes medications: metformin 1000 mg BID Current orders for Inpatient glycemic control: metformin 1000 mg BID  HgbA1C - 5.6% - Excellent control Considered pre-diabetes  Inpatient Diabetes Program Recommendations:    Consider Novolog 0-9 units TID or may just want to check blood sugars 1-2x/day.  Follow.   Thank you. Lorenda Peck, RD, LDN, CDE Inpatient Diabetes Coordinator [phone] Thank you.

## 2021-01-04 NOTE — Progress Notes (Signed)
Pt has been alert and oriented to person, place, time and situation. Pt reports visual hallucinations, sees various objects and bugs on the floor, reports feeling paranoid, like people are following her. Pt has been isolating in her room, is out for meds and meals, denies suicidal and homicidal ideation, denies anxiety. Pt does not initiate interaction with staff or peers, answers questions with very brief answers, affect is blunted, eye contact is poor. EKG pending, passed along in report to oncoming shift. Will continue to monitor pt per Q15 minute face checks and monitor for safety and progress.

## 2021-01-05 LAB — GLUCOSE, CAPILLARY
Glucose-Capillary: 122 mg/dL — ABNORMAL HIGH (ref 70–99)
Glucose-Capillary: 118 mg/dL — ABNORMAL HIGH (ref 70–99)
Glucose-Capillary: 106 mg/dL — ABNORMAL HIGH (ref 70–99)

## 2021-01-05 NOTE — [facility] Suicide Risk Assessment (Signed)
[facility] INPATIENT:  Family/Significant Other Suicide Prevention Education  Suicide Prevention Education:  Contact Attempts: Maziah Mortimore (son) [phone], (name of family member/significant other) has been identified by the patient as the family member/significant other with whom the patient will be residing, and identified as the person(s) who will aid the patient in the event of a mental health crisis.  With written consent from the patient, two attempts were made to provide suicide prevention education, prior to and/or following the patient's discharge.  We were unsuccessful in providing suicide prevention education.  A suicide education pamphlet was given to the patient to share with family/significant other.  Date and time of first attempt:7/22  / 2:40pm Date and time of second attempt:7/23   /12:30pm  Contact Attempts: Abreana Eib (sister) [phone], (name of family member/significant other) has been identified by the patient as the family member/significant other with whom the patient will be residing, and identified as the person(s) who will aid the patient in the event of a mental health crisis.  With written consent from the patient, two attempts were made to provide suicide prevention education, prior to and/or following the patient's discharge.  We were unsuccessful in providing suicide prevention education.  A suicide education pamphlet was given to the patient to share with family/significant other.  Date and time of first attempt:7/22  /2:45pm Date and time of second attempt:7/23  /12:20pm  Mliss Fritz 01/05/2021, 12:33 PM

## 2021-01-05 NOTE — [facility] Group Notes (Signed)
LCSW Group Therapy Note  07/21/2020   10:00-11:00am   Topic:  Anger Triggers and Coping Skills  Participation Level:  Did Not Attend  Description of Group:   In this group, patients learned how to recognize the physical, cognitive, emotional, and behavioral responses they have to anger-provoking situations.  They identified their own common triggers and typical reactions then analyzed how these reactions are possibly beneficial and possibly unhelpful.  Focus was placed on how helpful it is to recognize the underlying emotions to anger in order to address these for more permanent resolution.  Emphasis was also on identifying possible replacement thoughts for the automatic thoughts generated in various situations shared by the group.  Therapeutic Goals: Patients will share situations that commonly incite their anger and how they typically respond Patients will identify how their coping skills work for them and/or against them Patients will explore possible alternative thoughts to their automatic ones Patients will learn that anger itself is normal and that healthier reactions can assist with resolving conflict rather than worsening situations  Summary of Patient Progress:  The patient was invited to group, did not attend.  Therapeutic Modalities:   Cognitive Behavioral Therapy Processing  Maretta Los

## 2021-01-05 NOTE — Progress Notes (Signed)
   01/05/21 1900  Psych Admission Type (Psych Patients Only)  Admission Status Voluntary  Psychosocial Assessment  Patient Complaints None  Eye Contact Fair  Facial Expression Sad;Sullen  Affect Appropriate to circumstance;Sad  Speech Logical/coherent  Interaction Assertive  Motor Activity Slow  Appearance/Hygiene In scrubs  Behavior Characteristics Cooperative;Appropriate to situation  Mood Pleasant  Thought Process  Coherency Concrete thinking  Content Blaming others  Delusions None reported or observed  Perception WDL  Hallucination None reported or observed  Judgment WDL  Confusion None  Danger to Self  Current suicidal ideation? Denies  Danger to Others  Danger to Others None reported or observed

## 2021-01-05 NOTE — Progress Notes (Addendum)
[facility] MD Progress Note  01/05/2021 4:21 PM Maureen Swanson  MRN:  [id number] Subjective:    Maureen Swanson is a 67 YOF with a PPHx of Schizoaffective disorder, bipolar type, as well as MDD, presenting voluntarily after a suicide attempt via overdose.   24 hr events: no documented behavioral issues, no PRN medications given for agitation.  On interview this morning, Pt is seen and examined today. Pt states her mood is ok . Pt rates her mood at 8/10 (10 is the best mood). Pt cannot sleep well.  She states she cannot fall asleep.. Nursing notes indicate that Pt slept for 5.5 hours. Pt states her appetite is good. Pt rates her anxiety at 7/10 (10 is the worst anxiety) Currently, Pt denies any suicidal ideation, homicidal ideation.  Patient reports visual hallucinations of people walking in front of her bugs crawling on the floor.  She endorses auditory hallucination hearing radio noise.  She states she is okay to go to [facility] residential program on Monday.  Pt denies any headache, nausea, vomiting, dizziness, chest pain, SOB, abdominal pain, diarrhea, and constipation. Pt denies any medication side effects and has been tolerating it well. Pt denies any concerns.   Objective-patient slept 5.5 hours last night.  BP 155/98 mmHg, pulse 58/min, afebrile No as needed medication given for agitation yesterday.  Her last CIWA was 0.  Nursing reported that she patient was hallucinating objects and bugs on the floor yesterday, she was paranoid that people are following her, she was isolating in her room.  CSW informed patient of acceptance to [facility] residential program for Monday 7/25.  Patient did not attend therapy today.  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 x 12 yrs.   Substance abuse ([place])    crack cocaine:  extensive treatment with [facility] from 2014 to 2018 for this and alcohol abuse.   TMJ (temporomandibular joint disorder)     Past Surgical History:  Procedure Laterality Date   BREAST LUMPECTOMY WITH NEEDLE LOCALIZATION  04/19/2012   Procedure: BREAST LUMPECTOMY WITH NEEDLE LOCALIZATION;  Surgeon: [name], MD;  Location: [facility];  Service: General;  Laterality: Right;   KNEE ARTHROSCOPY W/ PARTIAL MEDIAL MENISCECTOMY  05/01/2010   right   Family History:  Family History  Problem Relation Age of Onset   Diabetes Mother    Breast cancer Mother    Cancer Father    Bipolar disorder Maternal Aunt    Schizophrenia Maternal Grandfather    Alcoholism Maternal Uncle    Family Psychiatric  History: See H and P Social History: previously  reported by patient, different than current assessment. On that assessment patient admitted to more significant drug use. Social History   Substance and Sexual Activity  Alcohol Use Yes   Comment: 3x 40 oz beers/day     Social History   Substance and Sexual Activity  Drug Use Yes   Frequency: 5.0 times per week   Types: Cocaine, "Crack" cocaine    Social History   Socioeconomic History   Marital status: Single    Spouse name: Not on file   Number of children: Not on file   Years of education: Not on file   Highest education level:  Not on file  Occupational History   Not on file  Tobacco Use   Smoking status: Former    Packs/day: 1.00    Years: 0.00    Pack years: 0.00    Types: Cigarettes   Smokeless tobacco: Never  Vaping Use   Vaping Use: Never used  Substance and Sexual Activity   Alcohol use: Yes    Comment: 3x 40 oz beers/day   Drug use: Yes    Frequency: 5.0 times per week    Types: Cocaine, "Crack" cocaine   Sexual activity: Not Currently    Birth control/protection: Post-menopausal  Other Topics Concern   Not on file  Social History Narrative   Not on file   Social Determinants of Health   Financial Resource Strain: Not on file  Food Insecurity: Not on file  Transportation Needs: Not on file  Physical Activity: Not on file  Stress: Not on file  Social Connections: Not on file   Additional Social History:    She reports doing illegal drugs (mainly cocaine) 2x per month and drinking one "40 oz" per day.   Sleep: Poor  Appetite:  Good  Current Medications: Current Facility-Administered Medications  Medication Dose Route Frequency Provider Last Rate Last Admin   acetaminophen (TYLENOL) tablet 650 mg  650 mg Oral Q6H PRN Chalmers Guest, NP       albuterol (VENTOLIN HFA) 108 (90 Base) MCG/ACT inhaler 1 puff  1 puff Inhalation Q4H PRN Nwoko, Agnes I, NP       alum & mag hydroxide-simeth (MAALOX/MYLANTA) 200-200-20 MG/5ML suspension 30 mL  30 mL Oral Q4H PRN Chalmers Guest, NP       amLODipine (NORVASC) tablet 5 mg  5 mg Oral Daily Chalmers Guest, NP   5 mg at 01/05/21 0911   atenolol (TENORMIN) tablet 100 mg  100 mg Oral Daily Nelda Marseille, Amy E, MD   100 mg at 01/05/21 0910   atorvastatin (LIPITOR) tablet 40 mg  40 mg Oral QHS Chalmers Guest, NP   40 mg at 01/04/21 2202   benztropine (COGENTIN) tablet 1 mg  1 mg Oral QHS Nelda Marseille, Amy E, MD   1 mg at 01/04/21 2202   FLUoxetine (PROZAC) capsule 10 mg  10 mg Oral Daily Corky Sox, MD   10 mg at 01/05/21 0911   gabapentin (NEURONTIN)  capsule 300 mg  300 mg Oral TID Harlow Asa, MD   300 mg at 01/05/21 1305   hydrochlorothiazide (MICROZIDE) capsule 12.5 mg  12.5 mg Oral Daily Chalmers Guest, NP   12.5 mg at 01/05/21 0910   hydrOXYzine (ATARAX/VISTARIL) tablet 25 mg  25 mg Oral Q6H PRN Harlow Asa, MD   25 mg at 01/03/21 2113   loperamide (IMODIUM) capsule 2-4 mg  2-4 mg Oral PRN Harlow Asa, MD  LORazepam (ATIVAN) tablet 1 mg  1 mg Oral Q6H PRN Nelda Marseille, Amy E, MD       magnesium hydroxide (MILK OF MAGNESIA) suspension 30 mL  30 mL Oral Daily PRN Chalmers Guest, NP       melatonin tablet 3 mg  3 mg Oral QHS Nelda Marseille, Amy E, MD   3 mg at 01/04/21 2202   meloxicam (MOBIC) tablet 15 mg  15 mg Oral Daily Nelda Marseille, Amy E, MD   15 mg at 01/05/21 [id number]   metFORMIN (GLUCOPHAGE) tablet 1,000 mg  1,000 mg Oral BID WC Nelda Marseille, Amy E, MD   1,000 mg at 01/05/21 [id number]   multivitamin with minerals tablet 1 tablet  1 tablet Oral Daily Harlow Asa, MD   1 tablet at 01/05/21 0911   ondansetron (ZOFRAN-ODT) disintegrating tablet 4 mg  4 mg Oral Q6H PRN Harlow Asa, MD       pantoprazole (PROTONIX) EC tablet 40 mg  40 mg Oral Daily Chalmers Guest, NP   40 mg at 01/05/21 [id number]   prazosin (MINIPRESS) capsule 1 mg  1 mg Oral QHS Chalmers Guest, NP   1 mg at 01/04/21 2202   thiamine tablet 100 mg  100 mg Oral Daily Nelda Marseille, Amy E, MD   100 mg at 01/05/21 [id number]   traZODone (DESYREL) tablet 200 mg  200 mg Oral QHS PRN Corky Sox, MD   200 mg at 01/04/21 2204   valbenazine (INGREZZA) capsule 80 mg  80 mg Oral QHS Chalmers Guest, NP   80 mg at 01/04/21 2202   white petrolatum (VASELINE) gel   Topical PRN Corky Sox, MD        Lab Results:  Results for orders placed or performed during the hospital encounter of 01/02/21 (from the past 48 hour(s))  Glucose, capillary     Status: Abnormal   Collection Time: 01/04/21  6:18 AM  Result Value Ref Range   Glucose-Capillary 120 (H) 70 - 99 mg/dL    Comment: Glucose  reference range applies only to samples taken after fasting for at least 8 hours.  Glucose, capillary     Status: Abnormal   Collection Time: 01/04/21  5:29 PM  Result Value Ref Range   Glucose-Capillary 105 (H) 70 - 99 mg/dL    Comment: Glucose reference range applies only to samples taken after fasting for at least 8 hours.  Glucose, capillary     Status: Abnormal   Collection Time: 01/05/21 11:16 AM  Result Value Ref Range   Glucose-Capillary 122 (H) 70 - 99 mg/dL    Comment: Glucose reference range applies only to samples taken after fasting for at least 8 hours.    Blood Alcohol level:  Lab Results  Component Value Date   ETH 11 (H) 01/01/2021   ETH <5 [id number]    Metabolic Disorder Labs: Lab Results  Component Value Date   HGBA1C 5.6 01/03/2021   MPG 114.02 01/03/2021   MPG 100 11/20/2015   Lab Results  Component Value Date   PROLACTIN 5.7 08/10/2015   PROLACTIN 141.3 (H) 07/21/2015   Lab Results  Component Value Date   CHOL 211 (H) 01/03/2021   TRIG 109 01/03/2021   HDL 56 01/03/2021   CHOLHDL 3.8 01/03/2021   VLDL 22 01/03/2021   LDLCALC 133 (H) 01/03/2021   LDLCALC 135 (H) 11/23/2019    Physical Findings: AIMS: Facial and Oral Movements Muscles of Facial Expression: None, normal Lips and Perioral Area: None,  normal Jaw: None, normal Tongue: None, normal,Extremity Movements Upper (arms, wrists, hands, fingers): None, normal Lower (legs, knees, ankles, toes): None, normal, Trunk Movements Neck, shoulders, hips: None, normal, Overall Severity Severity of abnormal movements (highest score from questions above): None, normal Incapacitation due to abnormal movements: None, normal Patient's awareness of abnormal movements (rate only patient's report): No Awareness, Dental Status Current problems with teeth and/or dentures?: No Does patient usually wear dentures?: No  CIWA:  CIWA-Ar Total: 0 COWS:     Musculoskeletal: Strength & Muscle Tone: within  normal limits Gait & Station: normal Patient leans: N/A  Psychiatric Specialty Exam:  Presentation  General Appearance: Casual; Appropriate for Environment  Eye Contact:Fair  Speech:Slow , coherent and fluent  Speech Volume:Normal  Handedness:Right   Mood and Affect  Mood:Depressed  Affect:Congruent   Thought Process  Thought Processes:Coherent; Linear  Descriptions of Associations:Intact  Orientation:Full (Time, Place and Person)  Thought Content:Residual AVH but denies ideas of reference or first rank symptoms; residual paranoia  History of Schizophrenia/Schizoaffective disorder:Yes  Duration of Psychotic Symptoms:Greater than six months  Hallucinations:Hallucinations: Auditory Description of Command Hallucinations: People talking, bugs on the floor. Description of Auditory Hallucinations: He is radio, endorses command hallucinations saying" I am not good" Description of Visual Hallucinations: Bugs crawling on the floor, people walking  Ideas of Reference:None  Suicidal Thoughts:Denied Homicidal Thoughts:Homicidal Thoughts: No   Sensorium  Memory:Immediate Fair; Recent Fair; Remote Fair  Judgment:Poor  Insight:Poor   Executive Functions  Concentration:Fair  Attention Span:Fair  [place]   Psychomotor Activity  Psychomotor Activity:Psychomotor Activity: Normal   Assets  Assets:Desire for Improvement; Armed forces logistics/support/administrative officer; Physical Health; Resilience   Sleep  Sleep:Sleep: Fair Number of Hours of Sleep: 5.75    Physical Exam: Physical Exam Constitutional:      Appearance: Normal appearance.  HENT:     Head: Normocephalic and atraumatic.  Cardiovascular:     Rate and Rhythm: Normal rate.  Pulmonary:     Effort: Pulmonary effort is normal.  Neurological:     General: No focal deficit present.     Mental Status: She is alert.   Review of Systems  Constitutional:  Negative for chills  and fever.  HENT:  Negative for hearing loss.   Eyes:  Negative for blurred vision.  Respiratory:  Negative for shortness of breath.   Cardiovascular:  Negative for chest pain.  Gastrointestinal:  Negative for diarrhea and vomiting.  Neurological:  Negative for dizziness, tremors and headaches.  Psychiatric/Behavioral:  Positive for hallucinations and substance abuse. Negative for depression, memory loss and suicidal ideas. The patient is nervous/anxious.   Blood pressure (!) 155/98, pulse (!) 58, temperature 98.5 F (36.9 C), temperature source Oral, resp. rate 18, height '5\' 5"'$  (1.651 m), weight 97.1 kg, SpO2 100 %. Body mass index is 35.62 kg/m.  Treatment plan Maureen Swanson is a 52 YOF with a PPHx of Schizoaffective disorder, bipolar type, as well as MDD, presenting voluntarily after a suicide attempt via overdose.   Safety and Monitoring -- VOLUNTARY admission to inpatient psychiatric unit for safety, stabilization and treatment -- Daily contact with patient to assess and evaluate symptoms and progress in treatment -- Patient's case to be discussed in multi-disciplinary team meeting -- Observation Level : q15 minute checks -- Vital signs:  q12 hours -- Precautions: suicide   Schizoaffective d/o bipolar type by hx -Abilify maintena 400 mg IM given June 30th, next due July 29th Antipsychotic labs  EKG: NSR, Qtc 440 with LVH             Lipids: Chol 211, LDL 133             A1C: 5.6 AIMS: 0, no rigidity or cogwheeling -Continue Ingrezza 80 mg daily for previous TD  -Continue Prozac 10 mg daily (7/22) -Continue trazodone to 200 mg for sleep and  Melatonin '3mg'$  po qhs -Continue Congentin '1mg'$  qhs - continue Prazosin 1 mg for nightmares -Continue Neurontin '300mg'$  tid   Stimulant Use d/o - cocaine type Alcohol Use Disorder -CIWA: 1 mg Ativan q6hr PRN for score >10, patient currently scoring 0 -multivitamin and thiamine oral replacement - counseled on need to abstain from  illicit substances and patient okay to go to [facility] CBC: with mild anemia of 11.8 CMP: unremarkable EtOH of 11 mg/dL UDS with cocaine CBG <150 TSH 0.87  Type 2 DM FSBS qAC and daily in the AM Continue metformin 1,000 mg bid  HTN Currently hypertensive, continue to monitor BP Continue Norvasc 5 mg, atenolol 100 mg, and HCTZ 12.5 mg  Miscellaneous -Continue albuterol for asthma, lipitor for HLD, Protonix for GERD, Vaseline for dry lips and legs   Continue PRN's: Tylenol, Maalox, Atarax, Milk of Magnesia, imodium, and [place] PGY-2, Psychiatry

## 2021-01-05 NOTE — Progress Notes (Signed)
   01/04/21 2205  Psych Admission Type (Psych Patients Only)  Admission Status Voluntary  Psychosocial Assessment  Patient Complaints None  Eye Contact Fair  Facial Expression Sad;Sullen  Affect Appropriate to circumstance;Sad  Speech Logical/coherent  Interaction Assertive  Motor Activity Slow  Appearance/Hygiene In scrubs  Behavior Characteristics Cooperative;Appropriate to situation  Mood Pleasant  Thought Process  Coherency Concrete thinking  Content Blaming others  Delusions None reported or observed  Perception WDL  Hallucination None reported or observed  Judgment WDL  Confusion None  Danger to Self  Current suicidal ideation? Denies  Danger to Others  Danger to Others None reported or observed

## 2021-01-05 NOTE — Progress Notes (Signed)
   01/05/21 2130  Psych Admission Type (Psych Patients Only)  Admission Status Voluntary  Psychosocial Assessment  Patient Complaints None  Eye Contact Fair  Facial Expression Sad;Sullen  Affect Appropriate to circumstance;Sad  Speech Logical/coherent  Interaction Assertive  Motor Activity Slow  Appearance/Hygiene Improved  Behavior Characteristics Appropriate to situation;Cooperative  Mood Pleasant  Thought Pension scheme manager thinking  Content Blaming others  Delusions None reported or observed  Perception WDL  Hallucination None reported or observed  Judgment WDL  Confusion None  Danger to Self  Current suicidal ideation? Denies  Danger to Others  Danger to Others None reported or observed

## 2021-01-06 LAB — GLUCOSE, CAPILLARY
Glucose-Capillary: 117 mg/dL — ABNORMAL HIGH (ref 70–99)
Glucose-Capillary: 135 mg/dL — ABNORMAL HIGH (ref 70–99)
Glucose-Capillary: 105 mg/dL — ABNORMAL HIGH (ref 70–99)
Glucose-Capillary: 130 mg/dL — ABNORMAL HIGH (ref 70–99)

## 2021-01-06 MED ORDER — HYDRALAZINE HCL 50 MG PO TABS
50.0000 mg | ORAL_TABLET | Freq: Three times a day (TID) | ORAL | Status: DC
  Administered 2021-01-06 – 2021-01-09 (×10): 50 mg via ORAL
  Filled 2021-01-06 (×5): qty 1
  Filled 2021-01-06 (×2): qty 42
  Filled 2021-01-06 (×2): qty 1
  Filled 2021-01-06: qty 42
  Filled 2021-01-06 (×4): qty 1
  Filled 2021-01-06: qty 42
  Filled 2021-01-06: qty 1

## 2021-01-06 MED ORDER — FLUOXETINE HCL 10 MG PO CAPS
10.0000 mg | ORAL_CAPSULE | Freq: Every day | ORAL | Status: DC
  Administered 2021-01-07 – 2021-01-08 (×2): 10 mg via ORAL
  Filled 2021-01-06 (×4): qty 1

## 2021-01-06 MED ORDER — HYDRALAZINE HCL 10 MG PO TABS
10.0000 mg | ORAL_TABLET | Freq: Once | ORAL | Status: AC
  Administered 2021-01-06: 10 mg via ORAL
  Filled 2021-01-06 (×2): qty 1

## 2021-01-06 MED ORDER — AMLODIPINE BESYLATE 10 MG PO TABS
10.0000 mg | ORAL_TABLET | Freq: Every day | ORAL | Status: DC
  Administered 2021-01-07 – 2021-01-09 (×3): 10 mg via ORAL
  Filled 2021-01-06: qty 14
  Filled 2021-01-06 (×4): qty 1

## 2021-01-06 MED ORDER — ONDANSETRON 4 MG PO TBDP
4.0000 mg | ORAL_TABLET | Freq: Four times a day (QID) | ORAL | Status: AC | PRN
  Administered 2021-01-09: 4 mg via ORAL
  Filled 2021-01-06: qty 1

## 2021-01-06 MED ORDER — LOPERAMIDE HCL 2 MG PO CAPS: 2.0000 mg | ORAL_CAPSULE | ORAL | Status: AC | PRN

## 2021-01-06 MED ORDER — AMLODIPINE BESYLATE 5 MG PO TABS
5.0000 mg | ORAL_TABLET | Freq: Once | ORAL | Status: AC
  Administered 2021-01-06: 5 mg via ORAL
  Filled 2021-01-06: qty 1

## 2021-01-06 MED ORDER — FLUOXETINE HCL 20 MG PO CAPS
20.0000 mg | ORAL_CAPSULE | Freq: Every day | ORAL | Status: DC
  Filled 2021-01-06 (×2): qty 1

## 2021-01-06 MED ORDER — LORAZEPAM 1 MG PO TABS: 1.0000 mg | ORAL_TABLET | Freq: Four times a day (QID) | ORAL | Status: AC | PRN

## 2021-01-06 MED ORDER — HYDROXYZINE HCL 25 MG PO TABS
25.0000 mg | ORAL_TABLET | Freq: Four times a day (QID) | ORAL | Status: AC | PRN
  Administered 2021-01-07 – 2021-01-08 (×2): 25 mg via ORAL
  Filled 2021-01-06 (×2): qty 1

## 2021-01-06 NOTE — [facility] Counselor (Signed)
CSW left a message for Ms. June at [facility] that pt will be unable to be admitted to their program tomorrow.  Toney Reil, [name] Worker [employer]

## 2021-01-06 NOTE — [facility] Group Notes (Signed)
[place] LCSW Group Therapy Note  Date/Time:  01/06/2021  10:00-11:00AM  Type of Therapy and Topic:  Group Therapy:  Healthy and Unhealthy Supports  Participation Level:  Minimal   Description of Group:  Patients in this group were introduced to the idea of adding a variety of healthy supports to address the various needs in their lives.Patients discussed what additional healthy supports could be helpful in their recovery and wellness after discharge in order to prevent future hospitalizations.   An emphasis was placed on using counselor, doctor, therapy groups, 12-step groups, and problem-specific support groups to expand supports.  Several songs were played to emphasize points made throughout group.  Therapeutic Goals:   1)  discuss importance of adding supports to stay well once out of the hospital  2)  compare healthy versus unhealthy supports and identify some examples of each  3)  generate ideas and descriptions of healthy supports that can be added  4)  offer mutual support about how to address unhealthy supports  5)  encourage active participation in and adherence to discharge plan    Summary of Patient Progress:  The patient stated that current healthy supports in her life are her son and sister while current unhealthy supports include the people she hangs around with.  The patient stayed for only part of the group then left.   Therapeutic Modalities:   Motivational Interviewing Brief Solution-Focused Therapy  Selmer Dominion, LCSW

## 2021-01-06 NOTE — Progress Notes (Addendum)
[facility] MD Progress Note  01/06/2021 1:23 PM Maureen Swanson  MRN:  [id number] Subjective:    Maureen Swanson is a 6 YOF with a PPHx of Schizoaffective disorder, bipolar type, as well as MDD, presenting voluntarily after a suicide attempt via overdose.   24 hr events: no documented behavioral issues, no PRN medications given for agitation.  On interview this morning, patient reports that she did not sleep well (6.25 hours documented in the chart). She asks for a higher dose of Trazodone but is amenable to continuing with the same dose and melatonin. She reports an episode of nausea and vomiting last night. Patient reports desire to continue Prozac at current dose despite GI side effects. She reports improvement in her AVH, saying that the last [place] she had was yesterday morning involving "some dots running around". Patient was updated that admission to [facility] will have to be deferred given patient's medical management. She currently denies SI and AVH.   This afternoon, patient had a BP of 182/90 with a HR of 43. Patient complains of mild dizziness. On assessment she is fully alert and oriented, denying chest pain. She is able to ambulate back to her room with minimal assistance. Medicine consult called, see plan below.    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 x 12 yrs.   Substance abuse ([place])    crack cocaine:  extensive treatment with [place] from 2014 to 2018 for this and alcohol abuse.   TMJ (temporomandibular joint disorder)     Past Surgical History:  Procedure Laterality Date   BREAST LUMPECTOMY WITH NEEDLE LOCALIZATION  04/19/2012   Procedure: BREAST LUMPECTOMY WITH NEEDLE LOCALIZATION;  Surgeon: [name], MD;  Location: [place];  Service: General;  Laterality: Right;   KNEE ARTHROSCOPY W/ PARTIAL MEDIAL MENISCECTOMY  05/01/2010   right   Family History:  Family History  Problem Relation Age of Onset   Diabetes Mother    Breast cancer Mother    Cancer Father    Bipolar disorder Maternal Aunt    Schizophrenia Maternal Grandfather    Alcoholism Maternal Uncle    Family Psychiatric  History: See H and P Social History: previously reported by patient, different than current assessment. On that assessment patient admitted to more significant drug use. Social History   Substance and Sexual Activity  Alcohol Use Yes   Comment: 3x 40 oz beers/day     Social History   Substance and Sexual Activity  Drug Use Yes   Frequency: 5.0 times per week   Types: Cocaine, "Crack" cocaine  Social History   Socioeconomic History   Marital status: Single    Spouse name: Not on file   Number of children: Not on file   Years of education: Not on file   Highest education level: Not on file  Occupational History   Not on file  Tobacco Use   Smoking status: Former    Packs/day: 1.00    Years: 0.00    Pack years: 0.00    Types: Cigarettes   Smokeless tobacco: Never  Vaping Use   Vaping Use: Never used  Substance and Sexual Activity   Alcohol use: Yes    Comment: 3x 40 oz beers/day   Drug use: Yes    Frequency: 5.0 times per week    Types: Cocaine, "Crack"  cocaine   Sexual activity: Not Currently    Birth control/protection: Post-menopausal  Other Topics Concern   Not on file  Social History Narrative   Not on file   Social Determinants of Health   Financial Resource Strain: Not on file  Food Insecurity: Not on file  Transportation Needs: Not on file  Physical Activity: Not on file  Stress: Not on file  Social Connections: Not on file   Additional Social History:    She reports doing illegal drugs (mainly cocaine) 2x per month and drinking one "40 oz" per day.   Sleep: Poor  Appetite:  Good  Current Medications: Current Facility-Administered Medications  Medication Dose Route Frequency Provider Last Rate Last Admin   acetaminophen (TYLENOL) tablet 650 mg  650 mg Oral Q6H PRN Chalmers Guest, NP       albuterol (VENTOLIN HFA) 108 (90 Base) MCG/ACT inhaler 1 puff  1 puff Inhalation Q4H PRN Nwoko, Herbert Pun I, NP       alum & mag hydroxide-simeth (MAALOX/MYLANTA) 200-200-20 MG/5ML suspension 30 mL  30 mL Oral Q4H PRN Chalmers Guest, NP       [START ON 01/07/2021] amLODipine (NORVASC) tablet 10 mg  10 mg Oral Daily Kyle, Tyrone A, DO       atorvastatin (LIPITOR) tablet 40 mg  40 mg Oral QHS Chalmers Guest, NP   40 mg at 01/05/21 2113   benztropine (COGENTIN) tablet 1 mg  1 mg Oral QHS Nelda Marseille, Ryen Rhames E, MD   1 mg at 01/05/21 2112   [START ON 01/07/2021] FLUoxetine (PROZAC) capsule 10 mg  10 mg Oral Daily Corky Sox, MD       gabapentin (NEURONTIN) capsule 300 mg  300 mg Oral TID Nelda Marseille, Heliodoro Domagalski E, MD   300 mg at 01/06/21 1230   hydrALAZINE (APRESOLINE) tablet 50 mg  50 mg Oral Q8H Kyle, Tyrone A, DO       hydrochlorothiazide (MICROZIDE) capsule 12.5 mg  12.5 mg Oral Daily Chalmers Guest, NP   12.5 mg at 01/06/21 [id number]   hydrOXYzine (ATARAX/VISTARIL) tablet 25 mg  25 mg Oral Q6H PRN Harlow Asa, MD       loperamide (IMODIUM) capsule 2-4 mg  2-4 mg Oral PRN Harlow Asa, MD       LORazepam (ATIVAN) tablet 1 mg  1 mg Oral Q6H PRN  Nelda Marseille, Yalanda Soderman E, MD       magnesium hydroxide (MILK OF MAGNESIA) suspension 30 mL  30 mL Oral Daily PRN Chalmers Guest, NP       melatonin tablet 3 mg  3 mg Oral QHS Nelda Marseille, Belen Zwahlen E, MD   3 mg at 01/05/21 2113   meloxicam (MOBIC) tablet 15  mg  15 mg Oral Daily Nelda Marseille, Naren Benally E, MD   15 mg at 01/06/21 [id number]   metFORMIN (GLUCOPHAGE) tablet 1,000 mg  1,000 mg Oral BID WC Nelda Marseille, Brenan Modesto E, MD   1,000 mg at 01/06/21 [id number]   multivitamin with minerals tablet 1 tablet  1 tablet Oral Daily Harlow Asa, MD   1 tablet at 01/06/21 0829   ondansetron (ZOFRAN-ODT) disintegrating tablet 4 mg  4 mg Oral Q6H PRN Harlow Asa, MD       pantoprazole (PROTONIX) EC tablet 40 mg  40 mg Oral Daily Chalmers Guest, NP   40 mg at 01/06/21 [id number]   prazosin (MINIPRESS) capsule 1 mg  1 mg Oral QHS Chalmers Guest, NP   1 mg at 01/05/21 2112   thiamine tablet 100 mg  100 mg Oral Daily Nelda Marseille, Shondrika Hoque E, MD   100 mg at 01/06/21 0830   traZODone (DESYREL) tablet 200 mg  200 mg Oral QHS PRN Corky Sox, MD   200 mg at 01/05/21 2112   valbenazine (INGREZZA) capsule 80 mg  80 mg Oral QHS Chalmers Guest, NP   80 mg at 01/05/21 2112   white petrolatum (VASELINE) gel   Topical PRN Corky Sox, MD        Lab Results:  Results for orders placed or performed during the hospital encounter of 01/02/21 (from the past 48 hour(s))  Glucose, capillary     Status: Abnormal   Collection Time: 01/04/21  5:29 PM  Result Value Ref Range   Glucose-Capillary 105 (H) 70 - 99 mg/dL    Comment: Glucose reference range applies only to samples taken after fasting for at least 8 hours.  Glucose, capillary     Status: Abnormal   Collection Time: 01/05/21 11:16 AM  Result Value Ref Range   Glucose-Capillary 122 (H) 70 - 99 mg/dL    Comment: Glucose reference range applies only to samples taken after fasting for at least 8 hours.  Glucose, capillary     Status: Abnormal   Collection Time: 01/05/21  5:28 PM  Result Value Ref Range    Glucose-Capillary 118 (H) 70 - 99 mg/dL    Comment: Glucose reference range applies only to samples taken after fasting for at least 8 hours.  Glucose, capillary     Status: Abnormal   Collection Time: 01/05/21  8:17 PM  Result Value Ref Range   Glucose-Capillary 106 (H) 70 - 99 mg/dL    Comment: Glucose reference range applies only to samples taken after fasting for at least 8 hours.  Glucose, capillary     Status: Abnormal   Collection Time: 01/06/21  5:54 AM  Result Value Ref Range   Glucose-Capillary 117 (H) 70 - 99 mg/dL    Comment: Glucose reference range applies only to samples taken after fasting for at least 8 hours.   Comment 1 Notify RN   Glucose, capillary     Status: Abnormal   Collection Time: 01/06/21 12:09 PM  Result Value Ref Range   Glucose-Capillary 135 (H) 70 - 99 mg/dL    Comment: Glucose reference range applies only to samples taken after fasting for at least 8 hours.    Blood Alcohol level:  Lab Results  Component Value Date   ETH 11 (H) 01/01/2021   ETH <5 [id number]    Metabolic Disorder Labs: Lab Results  Component Value Date   HGBA1C 5.6 01/03/2021   MPG 114.02 01/03/2021   MPG 100 11/20/2015  Lab Results  Component Value Date   PROLACTIN 5.7 08/10/2015   PROLACTIN 141.3 (H) 07/21/2015   Lab Results  Component Value Date   CHOL 211 (H) 01/03/2021   TRIG 109 01/03/2021   HDL 56 01/03/2021   CHOLHDL 3.8 01/03/2021   VLDL 22 01/03/2021   LDLCALC 133 (H) 01/03/2021   LDLCALC 135 (H) 11/23/2019    Physical Findings: AIMS: Facial and Oral Movements Muscles of Facial Expression: None, normal Lips and Perioral Area: None, normal Jaw: None, normal Tongue: None, normal,Extremity Movements Upper (arms, wrists, hands, fingers): None, normal Lower (legs, knees, ankles, toes): None, normal, Trunk Movements Neck, shoulders, hips: None, normal, Overall Severity Severity of abnormal movements (highest score from questions above): None,  normal Incapacitation due to abnormal movements: None, normal Patient's awareness of abnormal movements (rate only patient's report): No Awareness, Dental Status Current problems with teeth and/or dentures?: No Does patient usually wear dentures?: No  CIWA:  CIWA-Ar Total: 0 COWS:     Musculoskeletal: Strength & Muscle Tone: within normal limits Gait & Station: normal Patient leans: N/A  Psychiatric Specialty Exam:  Presentation  General Appearance: Casual  Eye Contact:Fair  Speech:mumbling quality but normal fluency   Speech Volume:Normal  Handedness:Right   Mood and Affect  Mood:Depressed  Affect:constricted   Thought Process  Thought Processes:Coherent, superficially goal directed  Descriptions of Associations:Intact  Orientation:Full (Time, Place and Person)  Thought Content: denies AVH currently, concerned with sleep and physical symptoms; no evidence of response to internal stimuli or paranoia on exam  History of Schizophrenia/Schizoaffective disorder:Yes  Duration of Psychotic Symptoms:Greater than six months  Hallucinations: Denied today  Ideas of Reference:None  Suicidal Thoughts:Denied Homicidal Thoughts:Homicidal Thoughts: No   Sensorium  Memory:Immediate Fair; Recent Fair; Remote Fair  Judgment:Poor  Insight:Poor   Executive Functions  Concentration:Fair  Attention Span:Fair  [place]   Psychomotor Activity  Psychomotor Activity:Psychomotor Activity: Normal   Assets  Assets:Social Support   Sleep  Sleep:Sleep: Fair Number of Hours of Sleep: 6.25   Physical Exam Constitutional:      Appearance: Normal appearance.  HENT:     Head: Normocephalic and atraumatic.  Cardiovascular:     Rate and Rhythm: Normal rate.  Pulmonary:     Effort: Pulmonary effort is normal.  Neurological:     General: No focal deficit present.     Mental Status: She is alert.   Review of Systems   Constitutional:  Negative for chills and fever.  HENT:  Negative for hearing loss.   Eyes:  Negative for blurred vision.  Respiratory:  Negative for shortness of breath.   Cardiovascular:  Negative for chest pain.  Gastrointestinal:  Positive for nausea and vomiting. Negative for diarrhea.  Neurological:  Negative for dizziness, tremors and headaches.  Psychiatric/Behavioral:  Positive for hallucinations and substance abuse. Negative for depression, memory loss and suicidal ideas. The patient is nervous/anxious.   Blood pressure (!) 181/95, pulse (!) 44, temperature 97.8 F (36.6 C), temperature source Oral, resp. rate 18, height '5\' 5"'$  (1.651 m), weight 97.1 kg, SpO2 98 %. Body mass index is 35.62 kg/m.  Treatment plan Jasey Shelton is a 84 YOF with a PPHx of Schizoaffective disorder, bipolar type, as well as MDD, presenting voluntarily after a suicide attempt via overdose.   Safety and Monitoring -- VOLUNTARY admission to inpatient psychiatric unit for safety, stabilization and treatment -- Daily contact with patient to assess and evaluate symptoms and progress in treatment -- Patient's case  to be discussed in multi-disciplinary team meeting -- Observation Level : q15 minute checks -- Vital signs:  q12 hours -- Precautions: suicide   Schizoaffective d/o bipolar type by hx -Abilify maintena 400 mg IM given June 30th, next due July 29th Antipsychotic labs             EKG: NSR, Qtc 440 with LVH             Lipids: Chol 211, LDL 133             A1C: 5.6 AIMS: 0, no rigidity or cogwheeling -Continue Ingrezza 80 mg daily for previous TD -Continue Prozac 10 mg daily (started 7/22) -Continue trazodone to 200 mg for sleep and  Melatonin '3mg'$  po qhs -Continue Congentin '1mg'$  qhs - continue Prazosin 1 mg for nightmares -Continue Neurontin '300mg'$  tid   Stimulant Use d/o - cocaine type Alcohol Use Disorder -CIWA: 1 mg Ativan q6hr PRN for score >10, patient currently scoring 0 -multivitamin and  thiamine oral replacement - counseled on need to abstain from illicit substances and patient okay to go to [place] Management CBC: with mild anemia of 11.8 CMP: unremarkable EtOH of 11 mg/dL UDS with cocaine CBG <150 TSH 0.87  Bradycardia and HTN EKG 7/24 showing sinus brady, normal PR interval Medicine consultant to see patient today, appreciate recs for BP management -D/C atenolol given bradycardia and rechecking vitals q2 hours  Type 2 DM FSBS qAC and daily in the AM, acceptable so far Continue metformin 1,000 mg bid  Miscellaneous -Continue albuterol for asthma, lipitor for HLD, Protonix for GERD, Vaseline for dry lips and legs   Continue PRN's: Tylenol, Maalox, Atarax, Milk of Magnesia, imodium, and [place] PGY-1, Psychiatry

## 2021-01-06 NOTE — Progress Notes (Signed)
   01/06/21 2200  Psych Admission Type (Psych Patients Only)  Admission Status Voluntary  Psychosocial Assessment  Patient Complaints None  Eye Contact Fair  Facial Expression Sad;Sullen  Affect Appropriate to circumstance;Sad  Speech Logical/coherent  Interaction Assertive  Motor Activity Slow  Appearance/Hygiene Improved  Behavior Characteristics Cooperative  Mood Sad;Preoccupied  Thought Process  Coherency Concrete thinking  Content Blaming others  Delusions None reported or observed  Perception WDL  Hallucination None reported or observed  Judgment WDL  Confusion None  Danger to Self  Current suicidal ideation? Denies  Danger to Others  Danger to Others None reported or observed

## 2021-01-06 NOTE — Plan of Care (Signed)
Spoke with Dr. Marylyn Ishihara with hospitalist service who saw patient today. He recommends that Atenolol be held and feels bradycardia is likely secondary to beta blocker use. He is starting Hydralazine '50mg'$  tid and states if BP remains high and HR remains low tomorrow morning she should be sent to ED for IV Cardizem or IV Hydralazine. He is additionally increasing her Norvasc to '10mg'$  daily and continuing HCTZ at present dose until her renal function can be reassessed. He feels patient can remain at [facility] at this time. See his note for details of discussion. Will check vitals q2 hours.   Viann Fish, MD, [facility]

## 2021-01-06 NOTE — H&P (Signed)
Medical Consultation  Maureen Swanson LDJ:[id number] DOB: 02-12-1966 DOA: 01/02/2021 PCP: Mack Hook, MD   Requesting physician: Dr. Nelda Marseille Date of consultation: 01/06/21 Reason for consultation: bradycardia and hypertension  Impression/Recommendations HTN     - current regimen: amlodipine 44m qday, atenolol 1029mqday, HCTZ 12.51m94mday, prazosin 1mg30may.      - hold atenolol d/t bradycardia     - increase amlodipine to 10mg351my, add hydralazine 50mg 86m    - check renal function panel as atenolol has renal clearance; it may take a bit to process out     - once renal function panel is complete; will reassess for starting ACEi/ARB     - check q4h vitals for now; if she sustains SBP 200 or greater, send to ED  Symptomatic bradycardia     - EKG w/o heart block; she is sinus brady     - most likely d/t atenolol; this has been d/c'd     - checking renal function panel; it may take a bit to process the atenolol out; get q4h vitals for now and make her fall risk/walks w/ assistance for now     - repeat EKG in the AM  Remainder per primary team.  [facility] will follow-up again tomorrow. Please contact me if I can be of assistance in the meanwhile. Thank you for this consultation.  Chief Complaint: SI  HPI:  Maureen SHAKIARA [id number] y.o61female with medical history significant of HTN, substance abuse, Bipolar disorder. Presenting with SI.   [facility] suicide attempt by ingesting 10 trazodone (300 mg tablets) and 1 ibuprofen 600 mg tablet. She reported worsening depression and SI since her boyfriend of 13 years died 5 days ago. She also reported AVH at that time. Patient is currently homeless. Per chart review, [facility]. At that time her boyfriend removed her from his house and she developed depression with SI. She had the Dx  of Schizoaffective d/o at that time and was D/C'd on Abilify Maintena 400 mg, perphenazine 4 mg AM / 8 mg PM, Depakote 500 mg, and Gabapentin 400 mg. Patient does not remember this hospitalization but volunteers that she was hospitalized in 2005 for cutting herself intentionally in the abdomen. On interview th patient reports her mood as "depressed" and reports AVH. With regard [facility] and horses as well as people with knives. She reports command AH, most recently 2 days ago, which tell her to harm herself. She reports first experiencing these symptoms when her mom died in 1988. 84erms of depressive symptomatology, patient reports lack of interest, guilt, and decreased concentration. Patient reports not wanting to be alive and says that her plan is to cut again or OD if she leaves the hospital.  [facility] for [facility]adycardia and HTN. She c/o'd of dizziness over the last couple of days. This prompted a BP check. She was noted to have SBP in the 180s and HR in 40s. Her current regimen is amlodipine 51mg qd57m atenolol 100mg qd46mHCTZ 12.51mg qday4mrazosin 1mg qday.59me was given a PRN dose of hydralazine that did not provide any relief. [facility] was called for assistance.   Review of Systems:  Reports dizziness. Denies CP, dyspnea, palpitations, fever. Remainder of ROS is negative for all not  mentioned in HPI.  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 x 12 yrs.   Substance abuse ([place])    crack cocaine:  extensive treatment with [facility] from 2014 to 2018 for this and alcohol abuse.   TMJ (temporomandibular joint disorder)    Past Surgical History:  Procedure Laterality Date   BREAST LUMPECTOMY WITH NEEDLE LOCALIZATION  04/19/2012   Procedure: BREAST LUMPECTOMY WITH NEEDLE LOCALIZATION;  Surgeon: [name], MD;  Location: [place];  Service: General;  Laterality: Right;   KNEE ARTHROSCOPY W/ PARTIAL MEDIAL MENISCECTOMY  05/01/2010   right   Social History:  reports that she has quit smoking. Her smoking use included cigarettes. She smoked an average of 1.00 packs per day. She has never used smokeless tobacco. She reports current alcohol use. She reports current drug use. Frequency: 5.00 times per week. Drugs: Cocaine and "Crack" cocaine.  Allergies  Allergen Reactions   Chocolate Hives   Orange Hives    "Acid foods"   Penicillins Hives    Has patient had a PCN reaction causing immediate rash, facial/tongue/throat swelling, SOB or lightheadedness with hypotension: Yes Has patient had a PCN reaction causing severe rash involving mucus membranes or skin necrosis: Yes Has patient had a PCN reaction that required hospitalization No Has patient had a PCN reaction occurring within the last 10 years: No If all of the above answers are "NO", then may proceed with Cephalosporin use.    Chocolate Flavor Hives   Other Swelling    strawberries   Penicillin G    Strawberry Extract Swelling   Tomato Hives    "acid foods"   Family History  Problem Relation Age of Onset   Diabetes Mother    Breast cancer Mother    Cancer Father    Bipolar disorder Maternal Aunt    Schizophrenia Maternal Grandfather    Alcoholism Maternal Uncle     Prior to Admission medications   Medication Sig Start Date End Date Taking? Authorizing Provider  ABILIFY MAINTENA 400 MG PRSY prefilled syringe Inject 400 mg into the muscle every 30 (thirty) days. 12/28/20   [provider]   Accu-Chek Softclix Lancets lancets Check blood glucose twice daily before meals 11/23/19   Mack Hook, MD  amLODipine (NORVASC) 5 MG tablet Take 5 mg by mouth daily. 12/21/20   [provider]  atenolol (TENORMIN) 100 MG tablet Take 100 mg by mouth daily. 12/21/20   [provider]  atorvastatin (LIPITOR) 40 MG tablet TAKE (1) TABLET BY MOUTH DAILY WITH SUPPER. 10/18/20   Mack Hook, MD  benztropine (COGENTIN) 1 MG tablet Take by mouth daily.     [provider]  Blood Glucose Monitoring Suppl (ACCU-CHEK AVIVA PLUS) w/Device KIT Check blood glucose twice daily before meals 11/23/19   Mack Hook, MD  cyclobenzaprine (FLEXERIL) 10 MG tablet Take 10 mg  by mouth daily as needed for muscle spasms. 12/21/20   [provider]  diclofenac Sodium (VOLTAREN) 1 % GEL Apply 1 application topically 4 (four) times daily as needed for pain. 10/09/20   [provider]  fexofenadine (ALLEGRA) 180 MG tablet TAKE 1 TABLET BY MOUTH ONCE DAILY. Patient taking differently: Take 180 mg by mouth daily. 10/18/20   Mack Hook, MD  gabapentin (NEURONTIN) 300 MG capsule TAKE (1) CAPSULE BY MOUTH THREE TIMES DAILY Patient taking differently: Take 300 mg by mouth 3 (three) times daily. 10/18/20   Mack Hook, MD  glucose blood (ACCU-CHEK AVIVA PLUS) test strip Check blood glucose twice daily before meals 11/23/19   Mack Hook, MD  hydrochlorothiazide (HYDRODIURIL) 12.5 MG tablet TAKE 1 TABLET BY MOUTH ONCE DAILY. Patient taking differently: Take 12.5 mg by mouth daily. 11/21/20   Mack Hook, MD  HYDROcodone-acetaminophen (NORCO) 5-325 MG tablet Take 1 tablet by mouth every 8 (eight) hours. Patient not taking: No sig reported 12/06/20   Horton, Kristie M, DO  ibuprofen (ADVIL) 600 MG tablet Take 600 mg by mouth 3 (three) times daily. 12/12/20   [provider]  INGREZZA 80 MG CAPS Take 80 mg by mouth at bedtime. 09/06/19   [provider]  LAMICTAL 100 MG tablet Take 100 mg by mouth daily. 09/06/19   [provider]  Melatonin 3 MG CAPS Take 3 mg by mouth at bedtime as needed (sleep). 09/06/19   [provider]  meloxicam (MOBIC) 15 MG tablet TAKE 1 TABLET BY MOUTH ONCE DAILY WITH A MEAL. Patient taking differently: Take 15 mg by mouth daily. 11/21/20   Mack Hook, MD  metFORMIN (GLUCOPHAGE) 1000 MG tablet TAKE 1 TABLET BY MOUTH TWICE DAILY WITH A MEAL. Patient taking differently: Take 1,000 mg by mouth 2 (two) times daily with a meal. 10/18/20   Mack Hook, MD  metoCLOPramide (REGLAN) 10 MG tablet Take 10 mg by mouth daily as needed for nausea. 12/21/20   [provider]  mometasone (NASONEX) 50 MCG/ACT nasal spray 2 sprays each nostril daily Patient taking differently: Place 2 sprays into the nose daily as needed (congestion). 11/23/19   Mack Hook, MD  omeprazole (PRILOSEC) 20 MG capsule Take 20 mg by mouth daily. 12/21/20   [provider]  ondansetron (ZOFRAN) 4 MG tablet Take 1 tablet (4 mg total) by mouth every 8 (eight) hours as needed. Patient not taking: No sig reported 02/16/20   Hyatt, Max T, DPM  prazosin (MINIPRESS) 1 MG capsule Take 1 mg by mouth at bedtime. 09/06/19   [provider]  traZODone (DESYREL) 150 MG tablet Take 150-300 mg by mouth at bedtime as needed for sleep. 09/06/19   [provider]  Vitamins/Minerals TABS Take 1 tablet by mouth daily.    [provider]   Physical Exam: Blood pressure (!) 181/95, pulse (!) 44, temperature 97.8 F (36.6 C), temperature source Oral, resp. rate 18, height 5' 5" (1.651 m), weight 97.1 kg, SpO2 98 %. Vitals:   01/06/21 0624 01/06/21 1202  BP: (!) 163/90 (!) 181/95  Pulse: (!) 56 (!) 44  Resp:    Temp:  97.8 F (36.6 C)  SpO2: 97% 98%    General: 55 y.o. female resting in bed in NAD Eyes: PERRL, normal sclera ENMT: Nares patent w/o discharge, orophaynx clear, dentition  normal, ears w/o discharge/lesions/ulcers Neck: Supple, trachea midline Cardiovascular: brady, +S1, S2, no m/g/r, equal pulses throughout Respiratory: CTABL, no w/r/r, normal WOB GI: BS+,  NDNT, no masses noted, no organomegaly noted MSK: No e/c/c Skin: No rashes, bruises, ulcerations noted Neuro: A&O x 3, no focal deficits Psyc: Appropriate interaction and affect, calm/cooperative  Labs on Admission:  Basic Metabolic Panel: Recent Labs  Lab 01/01/21 1022  NA 138  K 4.3  CL 107  CO2 23  GLUCOSE 105*  BUN 16  CREATININE 0.94  CALCIUM 8.8*   Liver Function Tests: Recent Labs  Lab 01/01/21 1022  AST 25  ALT 11  ALKPHOS 59  BILITOT 0.9  PROT 7.5  ALBUMIN 4.0   No results for input(s): LIPASE, AMYLASE in the last 168 hours. No results for input(s): AMMONIA in the last 168 hours. CBC: Recent Labs  Lab 01/01/21 1022  WBC 6.8  NEUTROABS 5.2  HGB 11.8*  HCT 38.0  MCV 92.5  PLT 184   Cardiac Enzymes: No results for input(s): CKTOTAL, CKMB, CKMBINDEX, TROPONINI in the last 168 hours. BNP: Invalid input(s): POCBNP CBG: Recent Labs  Lab 01/05/21 1116 01/05/21 1728 01/05/21 2017 01/06/21 0554 01/06/21 1209  GLUCAP 122* 118* 106* 117* 135*    Radiological Exams on Admission: No results found.  EKG: Independently reviewed. Sinus brady, no st elevations, no block noted  Time spent: 75 minutes  Rita Vialpando A Jonathan Kirkendoll DO [facility]  If 7PM-7AM, please contact night-coverage [email or web address] 01/06/2021, 2:07 PM

## 2021-01-06 NOTE — Progress Notes (Signed)
   01/06/21 1900  Psych Admission Type (Psych Patients Only)  Admission Status Voluntary  Psychosocial Assessment  Patient Complaints None  Eye Contact Fair  Facial Expression Sad;Sullen  Affect Appropriate to circumstance;Sad  Speech Logical/coherent  Interaction Assertive  Motor Activity Slow  Appearance/Hygiene Improved  Behavior Characteristics Appropriate to situation  Mood Pleasant  Thought Process  Coherency Concrete thinking  Content Blaming others  Delusions None reported or observed  Perception WDL  Hallucination None reported or observed  Judgment WDL  Confusion None  Danger to Self  Current suicidal ideation? Denies  Danger to Others  Danger to Others None reported or observed

## 2021-01-07 DIAGNOSIS — I10 Essential (primary) hypertension: Secondary | ICD-10-CM

## 2021-01-07 DIAGNOSIS — R001 Bradycardia, unspecified: Secondary | ICD-10-CM

## 2021-01-07 LAB — RENAL FUNCTION PANEL
Albumin: 3.6 g/dL (ref 3.5–5.0)
Anion gap: 6 (ref 5–15)
BUN: 16 mg/dL (ref 6–20)
CO2: 31 mmol/L (ref 22–32)
Calcium: 9.4 mg/dL (ref 8.9–10.3)
Chloride: 107 mmol/L (ref 98–111)
Creatinine, Ser: 0.94 mg/dL (ref 0.44–1.00)
GFR, Estimated: >60 mL/min (ref >60)
Glucose, Bld: 98 mg/dL (ref 70–99)
Phosphorus: 3.9 mg/dL (ref 2.5–4.6)
Potassium: 4 mmol/L (ref 3.5–5.1)
Sodium: 144 mmol/L (ref 135–145)

## 2021-01-07 LAB — GLUCOSE, CAPILLARY
Glucose-Capillary: 100 mg/dL — ABNORMAL HIGH (ref 70–99)
Glucose-Capillary: 107 mg/dL — ABNORMAL HIGH (ref 70–99)
Glucose-Capillary: 115 mg/dL — ABNORMAL HIGH (ref 70–99)

## 2021-01-07 LAB — MAGNESIUM: Magnesium: 1.8 mg/dL (ref 1.7–2.4)

## 2021-01-07 NOTE — Progress Notes (Signed)
PROGRESS NOTE  Maureen Swanson [id number] DOB: 1965-07-16   PCP: Mack Hook, MD  Patient is from: [facility]  DOA: 01/02/2021 LOS: 5  Chief complaints:  Chief Complaint  Patient presents with   MDD     Brief Narrative / Interim history: 55 year old F with PMH of HTN, DM-2, BPD, depression, substance abuse, OSA not on CPAP and class II obesity admitted to [facility] with suicidal ideation.  Hospitalist service consulted for bradycardia and hypertension.  EKG with sinus bradycardia without AV block.  TSH within normal.  Atenolol discontinued.  Antihypertensive meds adjusted.  BP and HR improved.   Objective: Vitals:   01/07/21 0636 01/07/21 0638 01/07/21 0641 01/07/21 0642  BP: (!) 157/80 (!) 158/102 (!) 153/87 (!) 152/89  Pulse: (!) 44 (!) 51 (!) 46 (!) 109  Resp:      Temp: 98.3 F (36.8 C)  98.4 F (36.9 C)   TempSrc: Oral  Oral   SpO2: 97% 98% 99% 100%  Weight:      Height:       No intake or output data in the 24 hours ending 01/07/21 1137 Filed Weights   01/02/21 1530 01/02/21 1730  Weight: 97.1 kg 97.1 kg    Examination:  Patient is at [facility]  Procedures:  None  Assessment & Plan: Sinus bradycardia/essential hypertension-bradycardia likely due to atenolol which has been discontinued.  EKG with sinus bradycardia and T wave changes in lateral leads but no AV block.  T wave changes are not new.  Renal panel within normal. -Check resting and exertional/ambulatory heart rate to make sure she has chronotropic response to exertion -Amlodipine 10 mg daily, hydralazine 50 mg 3 times daily, prazosin 1 mg daily and HCTZ 12.5 mg daily -We can add ACE inhibitor's or ARB down the road if needed -If she becomes tachycardic, we can resume beta-blocker at lower dose. -Patient is stable to stay at [facility].   History of sleep apnea: Not on CPAP -Could definitely contribute to her BP  NIDDM-2: CBG within acceptable range. Recent Labs  Lab 01/06/21 0554 01/06/21 1209 01/06/21 1706  01/06/21 2058 01/07/21 0605  GLUCAP 117* 135* 105* 130* 100*  -Continue current regimen  MDD/BPD/suicidal ideation -Per primary.  Class II obesity Body mass index is 35.62 kg/m.     Discussed the above plan with primary team, Dr. Alvie Heidelberg over the phone.      Sch Meds:  Scheduled Meds:  amLODipine  10 mg Oral Daily   atorvastatin  40 mg Oral QHS   benztropine  1 mg Oral QHS   FLUoxetine  10 mg Oral Daily   gabapentin  300 mg Oral TID   hydrALAZINE  50 mg Oral Q8H   hydrochlorothiazide  12.5 mg Oral Daily   melatonin  3 mg Oral QHS   meloxicam  15 mg Oral Daily   metFORMIN  1,000 mg Oral BID WC   multivitamin with minerals  1 tablet Oral Daily   pantoprazole  40 mg Oral Daily   prazosin  1 mg Oral QHS   thiamine  100 mg Oral Daily   valbenazine  80 mg Oral QHS   Continuous Infusions: PRN Meds:.acetaminophen, albuterol, alum & mag hydroxide-simeth, hydrOXYzine, loperamide, LORazepam, magnesium hydroxide, ondansetron, traZODone, white petrolatum  Antimicrobials: Anti-infectives (From admission, onward)    None        I have personally reviewed the following labs and images: CBC: Recent Labs  Lab 01/01/21 1022  WBC 6.8  NEUTROABS 5.2  HGB  11.8*  HCT 38.0  MCV 92.5  PLT 184   BMP &GFR Recent Labs  Lab 01/01/21 1022 01/07/21 0629  NA 138 144  K 4.3 4.0  CL 107 107  CO2 23 31  GLUCOSE 105* 98  BUN 16 16  CREATININE 0.94 0.94  CALCIUM 8.8* 9.4  PHOS  --  3.9   Estimated Creatinine Clearance: 77.9 mL/min (by C-G formula based on SCr of 0.94 mg/dL). Liver & Pancreas: Recent Labs  Lab 01/01/21 1022 01/07/21 0629  AST 25  --   ALT 11  --   ALKPHOS 59  --   BILITOT 0.9  --   PROT 7.5  --   ALBUMIN 4.0 3.6   No results for input(s): LIPASE, AMYLASE in the last 168 hours. No results for input(s): AMMONIA in the last 168 hours. Diabetic: No results for input(s): HGBA1C in the last 72 hours. Recent Labs  Lab 01/06/21 0554 01/06/21 1209  01/06/21 1706 01/06/21 2058 01/07/21 0605  GLUCAP 117* 135* 105* 130* 100*   Cardiac Enzymes: No results for input(s): CKTOTAL, CKMB, CKMBINDEX, TROPONINI in the last 168 hours. No results for input(s): PROBNP in the last 8760 hours. Coagulation Profile: No results for input(s): INR, PROTIME in the last 168 hours. Thyroid Function Tests: No results for input(s): TSH, T4TOTAL, FREET4, T3FREE, THYROIDAB in the last 72 hours. Lipid Profile: No results for input(s): CHOL, HDL, LDLCALC, TRIG, CHOLHDL, LDLDIRECT in the last 72 hours. Anemia Panel: No results for input(s): VITAMINB12, FOLATE, FERRITIN, TIBC, IRON, RETICCTPCT in the last 72 hours. Urine analysis:    Component Value Date/Time   COLORURINE STRAW (A) 01/01/2021 1455   APPEARANCEUR CLEAR 01/01/2021 1455   LABSPEC 1.006 01/01/2021 1455   PHURINE 6.0 01/01/2021 1455   GLUCOSEU NEGATIVE 01/01/2021 1455   HGBUR NEGATIVE 01/01/2021 1455   HGBUR negative 04/08/2010 1006   BILIRUBINUR NEGATIVE 01/01/2021 1455   KETONESUR NEGATIVE 01/01/2021 1455   PROTEINUR NEGATIVE 01/01/2021 1455   UROBILINOGEN 0.2 04/04/2015 0611   NITRITE NEGATIVE 01/01/2021 1455   LEUKOCYTESUR NEGATIVE 01/01/2021 1455   Sepsis Labs: Invalid input(s): PROCALCITONIN, Pink  Microbiology: Recent Results (from the past 240 hour(s))  Resp Panel by RT-PCR (Flu A&B, Covid) Nasopharyngeal Swab     Status: None   Collection Time: 01/01/21  4:17 PM   Specimen: Nasopharyngeal Swab; Nasopharyngeal(NP) swabs in vial transport medium  Result Value Ref Range Status   SARS Coronavirus 2 by RT PCR NEGATIVE NEGATIVE Final    Comment: (NOTE) SARS-CoV-2 target nucleic acids are NOT DETECTED.  The SARS-CoV-2 RNA is generally detectable in upper respiratory specimens during the acute phase of infection. The lowest concentration of SARS-CoV-2 viral copies this assay can detect is 138 copies/mL. A negative result does not preclude SARS-Cov-2 infection and should  not be used as the sole basis for treatment or other patient management decisions. A negative result may occur with  improper specimen collection/handling, submission of specimen other than nasopharyngeal swab, presence of viral mutation(s) within the areas targeted by this assay, and inadequate number of viral copies(<138 copies/mL). A negative result must be combined with clinical observations, patient history, and epidemiological information. The expected result is Negative.  Fact Sheet for Patients:  EntrepreneurPulse.com.au  Fact Sheet for Healthcare Providers:  IncredibleEmployment.be  This test is no t yet approved or cleared by the [place] FDA and  has been authorized for detection and/or diagnosis of SARS-CoV-2 by FDA under an Emergency Use Authorization (EUA). This EUA will remain  in effect (meaning this test can be used) for the duration of the COVID-19 declaration under Section 564(b)(1) of the Act, 21 U.S.C.section 360bbb-3(b)(1), unless the authorization is terminated  or revoked sooner.       Influenza A by PCR NEGATIVE NEGATIVE Final   Influenza B by PCR NEGATIVE NEGATIVE Final    Comment: (NOTE) The Xpert Xpress SARS-CoV-2/FLU/RSV plus assay is intended as an aid in the diagnosis of influenza from Nasopharyngeal swab specimens and should not be used as a sole basis for treatment. Nasal washings and aspirates are unacceptable for Xpert Xpress SARS-CoV-2/FLU/RSV testing.  Fact Sheet for Patients: EntrepreneurPulse.com.au  Fact Sheet for Healthcare Providers: IncredibleEmployment.be  This test is not yet approved or cleared by the [place] FDA and has been authorized for detection and/or diagnosis of SARS-CoV-2 by FDA under an Emergency Use Authorization (EUA). This EUA will remain in effect (meaning this test can be used) for the duration of the COVID-19 declaration under  Section 564(b)(1) of the Act, 21 U.S.C. section 360bbb-3(b)(1), unless the authorization is terminated or revoked.  Performed at [facility], [place] [address], [place], [place] [address]     Radiology Studies: No results found.    Javan Gonzaga T. Layton  If 7PM-7AM, please contact night-coverage [email or web address] 01/07/2021, 11:37 AM

## 2021-01-07 NOTE — Progress Notes (Signed)
Recreation Therapy Notes  Date:  7.25.22 Time: 0930 Location: 300 Hall Dayroom  Group Topic: Stress Management  Goal Area(s) Addresses:  Patient will identify positive stress management techniques. Patient will identify benefits of using stress management post d/c.  Intervention: Stress Management  Activity : Meditation.  LRT played a meditation that focused on being indecisive.  Meditation talked about being okay with the decisions you make even if they turn out to be the wrong decision, there is still room to learn from them.   Education:  Stress Management, Discharge Planning.   Education Outcome: Acknowledges Education  Clinical Observations/Feedback: Pt did not attend group session.    Victorino Sparrow, LRT/CTRS         Victorino Sparrow A 01/07/2021 11:55 AM

## 2021-01-07 NOTE — [facility] Group Notes (Signed)
Type of Therapy and Topic: Group Therapy: Anger Management   Participation Level: Did not attend   Description of Group: In this group, patients will learn helpful strategies and techniques to manage anger, express anger in alternative ways, change hostile attitudes, and prevent aggressive acts, such as verbal abuse and violence.This group will be process-oriented and eductional, with patients participating in exploration of their own experiences as well as giving and receiving support and challenge from other group members.  Therapeutic Goals: Patient will learn to manage anger. Patient will learn to stop violence or the threat of violence. Patient will learn to develop self control over thoughts and actions. Patient will receive support and feedback from others  Summary of Patient Progress: Did not attend    Therapeutic Modalities: Cognitive Behavioral Therapy Solution Focused Therapy Motivational Interviewing

## 2021-01-07 NOTE — [facility] Suicide Risk Assessment (Signed)
[place] INPATIENT:  Family/Significant Other Suicide Prevention Education  Suicide Prevention Education:  Education Completed; Asya Engelkes, son  (name of family member/significant other) has been identified by the patient as the family member/significant other with whom the patient will be residing, and identified as the person(s) who will aid the patient in the event of a mental health crisis (suicidal ideations/suicide attempt).  With written consent from the patient, the family member/significant other has been provided the following suicide prevention education, prior to the and/or following the discharge of the patient.  Patient son reports that he heard from his mother on a Friday and she reported that she had overdosed on Trazadone. Patient son reports that he only talks with his mother a couple of times a year.  Patient son reports that she has had one other suicide attempt that he knows of about 3/4 years ago when mother lived with him.  Son reports that he does not know much about her life right now but that he doesn't believe she has access to guns/weapons.   The suicide prevention education provided includes the following: Suicide risk factors Suicide prevention and interventions National Suicide Hotline telephone number [facility] assessment telephone number [facility] Emergency Assistance [place] and/or Residential Mobile Crisis Unit telephone number  Request made of family/significant other to: Remove weapons (e.g., guns, rifles, knives), all items previously/currently identified as safety concern.   Remove drugs/medications (over-the-counter, prescriptions, illicit drugs), all items previously/currently identified as a safety concern.  The family member/significant other verbalizes understanding of the suicide prevention education information provided.  The family member/significant other agrees to remove the items of safety concern listed above.  Courtlyn Aki  E Lanaiya Lantry 01/07/2021, 3:57 PM

## 2021-01-07 NOTE — Progress Notes (Addendum)
[facility] MD Progress Note  01/07/2021 3:24 PM MEEKAH FURLOUGH  MRN:  [id number] Subjective:   Maureen Swanson is a 62 YOF with a PPHx of Schizoaffective disorder, bipolar type, as well as MDD, presenting voluntarily after a suicide attempt via overdose.   24 hr events: no documented behavioral issues, no PRN medications given for agitation.  On interview this morning, patient reports an "okay" mood. She continues to deny recent AVH and SI, a significant improvement from admission. She reports continued dizziness when standing. Pt counseled on continued need for medical management before discharge to [facility]. She reports chronic SoB for which she uses her albuterol inhaler. Patient informed she has this PRN. She denies cravings for drugs and alcohol.    Principal Problem: Schizoaffective disorder, bipolar type ([place]) Diagnosis: Principal Problem:   Schizoaffective disorder, bipolar type ([place]) Active Problems:   Cocaine abuse ([place])  Total Time Spent in Direct Patient Care: I personally spent 30 minutes on the unit in direct patient care. The direct patient care time included face-to-face time with the patient, reviewing the patient's chart, communicating with other professionals, and coordinating care. Greater than 50% of this time was spent in counseling or coordinating care with the patient regarding goals of hospitalization, psycho-education, and discharge planning needs.   Past Psychiatric History: Schizoaffective disorder, bipolar type, depression  Past Medical History:  Past Medical History:  Diagnosis Date   Anxiety    Arthritis    back and knees   Asthma    daily and prn inhalers   Atypical ductal hyperplasia of breast 03/2012   right   Bipolar 1 disorder (HCC)    CHF (congestive heart failure) (HCC)    Depression    Diabetes mellitus    diet-controlled   Gastric ulcer    GERD (gastroesophageal reflux disease)    Gout    Headache(784.0)    migraines   High cholesterol     Hypertension    under control, has been on med. x 12 yrs.   Substance abuse ([place])    crack cocaine:  extensive treatment with [facility] from 2014 to 2018 for this and alcohol abuse.   TMJ (temporomandibular joint disorder)     Past Surgical History:  Procedure Laterality Date   BREAST LUMPECTOMY WITH NEEDLE LOCALIZATION  04/19/2012   Procedure: BREAST LUMPECTOMY WITH NEEDLE LOCALIZATION;  Surgeon: [name], MD;  Location: [place];  Service: General;  Laterality: Right;   KNEE ARTHROSCOPY W/ PARTIAL MEDIAL MENISCECTOMY  05/01/2010   right   Family History:  Family History  Problem Relation Age of Onset   Diabetes Mother    Breast cancer Mother    Cancer Father    Bipolar disorder Maternal Aunt    Schizophrenia Maternal Grandfather    Alcoholism Maternal Uncle    Family Psychiatric  History: See H and P Social History: previously reported by patient, different than current assessment. On that assessment patient admitted to more significant drug use. Social History   Substance and Sexual Activity  Alcohol Use Yes   Comment: 3x 40 oz beers/day     Social History   Substance and Sexual Activity  Drug Use Yes   Frequency: 5.0 times per week   Types: Cocaine, "Crack" cocaine    Social History   Socioeconomic History   Marital status: Single    Spouse name: Not on file   Number of children: Not on file   Years of  education: Not on file   Highest education level: Not on file  Occupational History   Not on file  Tobacco Use   Smoking status: Former    Packs/day: 1.00    Years: 0.00    Pack years: 0.00    Types: Cigarettes   Smokeless tobacco: Never  Vaping Use   Vaping Use: Never used  Substance and Sexual Activity   Alcohol use: Yes    Comment: 3x 40 oz beers/day   Drug use: Yes    Frequency: 5.0 times per week    Types: Cocaine, "Crack" cocaine   Sexual activity: Not Currently    Birth control/protection:  Post-menopausal  Other Topics Concern   Not on file  Social History Narrative   Not on file   Social Determinants of Health   Financial Resource Strain: Not on file  Food Insecurity: Not on file  Transportation Needs: Not on file  Physical Activity: Not on file  Stress: Not on file  Social Connections: Not on file   Additional Social History:    She reports doing illegal drugs (mainly cocaine) 2x per month and drinking one "40 oz" per day.   Sleep: Poor  Appetite:  Good  Current Medications: Current Facility-Administered Medications  Medication Dose Route Frequency Provider Last Rate Last Admin   acetaminophen (TYLENOL) tablet 650 mg  650 mg Oral Q6H PRN Chalmers Guest, NP       albuterol (VENTOLIN HFA) 108 (90 Base) MCG/ACT inhaler 1 puff  1 puff Inhalation Q4H PRN Nwoko, Herbert Pun I, NP       alum & mag hydroxide-simeth (MAALOX/MYLANTA) 200-200-20 MG/5ML suspension 30 mL  30 mL Oral Q4H PRN Chalmers Guest, NP       amLODipine (NORVASC) tablet 10 mg  10 mg Oral Daily Kyle, Tyrone A, DO   10 mg at 01/07/21 0838   atorvastatin (LIPITOR) tablet 40 mg  40 mg Oral QHS Chalmers Guest, NP   40 mg at 01/06/21 2058   benztropine (COGENTIN) tablet 1 mg  1 mg Oral QHS Nelda Marseille, Darreld Hoffer E, MD   1 mg at 01/06/21 2059   FLUoxetine (PROZAC) capsule 10 mg  10 mg Oral Daily Corky Sox, MD   10 mg at 01/07/21 [id number]   gabapentin (NEURONTIN) capsule 300 mg  300 mg Oral TID Harlow Asa, MD   300 mg at 01/07/21 1136   hydrALAZINE (APRESOLINE) tablet 50 mg  50 mg Oral Q8H Kyle, Tyrone A, DO   50 mg at 01/07/21 1304   hydrochlorothiazide (MICROZIDE) capsule 12.5 mg  12.5 mg Oral Daily Chalmers Guest, NP   12.5 mg at 01/07/21 [id number]   hydrOXYzine (ATARAX/VISTARIL) tablet 25 mg  25 mg Oral Q6H PRN Harlow Asa, MD       loperamide (IMODIUM) capsule 2-4 mg  2-4 mg Oral PRN Harlow Asa, MD       LORazepam (ATIVAN) tablet 1 mg  1 mg Oral Q6H PRN Nelda Marseille, Dewell Monnier E, MD       magnesium hydroxide  (MILK OF MAGNESIA) suspension 30 mL  30 mL Oral Daily PRN Chalmers Guest, NP       melatonin tablet 3 mg  3 mg Oral QHS Nelda Marseille, Chelesea Weiand E, MD   3 mg at 01/06/21 2059   meloxicam (MOBIC) tablet 15 mg  15 mg Oral Daily Viann Fish E, MD   15 mg at 01/07/21 [id number]   metFORMIN (GLUCOPHAGE) tablet 1,000 mg  1,000 mg Oral  BID WC Nelda Marseille, Ridgely Anastacio E, MD   1,000 mg at 01/07/21 [id number]   multivitamin with minerals tablet 1 tablet  1 tablet Oral Daily Harlow Asa, MD   1 tablet at 01/07/21 0838   ondansetron (ZOFRAN-ODT) disintegrating tablet 4 mg  4 mg Oral Q6H PRN Harlow Asa, MD       pantoprazole (PROTONIX) EC tablet 40 mg  40 mg Oral Daily Chalmers Guest, NP   40 mg at 01/07/21 [id number]   prazosin (MINIPRESS) capsule 1 mg  1 mg Oral QHS Chalmers Guest, NP   1 mg at 01/06/21 2059   thiamine tablet 100 mg  100 mg Oral Daily Viann Fish E, MD   100 mg at 01/07/21 [id number]   traZODone (DESYREL) tablet 200 mg  200 mg Oral QHS PRN Corky Sox, MD   200 mg at 01/06/21 2059   valbenazine (INGREZZA) capsule 80 mg  80 mg Oral QHS Chalmers Guest, NP   80 mg at 01/06/21 2059   white petrolatum (VASELINE) gel   Topical PRN Corky Sox, MD        Lab Results:  Results for orders placed or performed during the hospital encounter of 01/02/21 (from the past 48 hour(s))  Glucose, capillary     Status: Abnormal   Collection Time: 01/05/21  5:28 PM  Result Value Ref Range   Glucose-Capillary 118 (H) 70 - 99 mg/dL    Comment: Glucose reference range applies only to samples taken after fasting for at least 8 hours.  Glucose, capillary     Status: Abnormal   Collection Time: 01/05/21  8:17 PM  Result Value Ref Range   Glucose-Capillary 106 (H) 70 - 99 mg/dL    Comment: Glucose reference range applies only to samples taken after fasting for at least 8 hours.  Glucose, capillary     Status: Abnormal   Collection Time: 01/06/21  5:54 AM  Result Value Ref Range   Glucose-Capillary 117 (H) 70 - 99 mg/dL     Comment: Glucose reference range applies only to samples taken after fasting for at least 8 hours.   Comment 1 Notify RN   Glucose, capillary     Status: Abnormal   Collection Time: 01/06/21 12:09 PM  Result Value Ref Range   Glucose-Capillary 135 (H) 70 - 99 mg/dL    Comment: Glucose reference range applies only to samples taken after fasting for at least 8 hours.  Glucose, capillary     Status: Abnormal   Collection Time: 01/06/21  5:06 PM  Result Value Ref Range   Glucose-Capillary 105 (H) 70 - 99 mg/dL    Comment: Glucose reference range applies only to samples taken after fasting for at least 8 hours.  Glucose, capillary     Status: Abnormal   Collection Time: 01/06/21  8:58 PM  Result Value Ref Range   Glucose-Capillary 130 (H) 70 - 99 mg/dL    Comment: Glucose reference range applies only to samples taken after fasting for at least 8 hours.  Glucose, capillary     Status: Abnormal   Collection Time: 01/07/21  6:05 AM  Result Value Ref Range   Glucose-Capillary 100 (H) 70 - 99 mg/dL    Comment: Glucose reference range applies only to samples taken after fasting for at least 8 hours.   Comment 1 Notify RN    Comment 2 Document in Chart   Renal function panel     Status: None   Collection Time:  01/07/21  6:29 AM  Result Value Ref Range   Sodium 144 135 - 145 mmol/L   Potassium 4.0 3.5 - 5.1 mmol/L   Chloride 107 98 - 111 mmol/L   CO2 31 22 - 32 mmol/L   Glucose, Bld 98 70 - 99 mg/dL    Comment: Glucose reference range applies only to samples taken after fasting for at least 8 hours.   BUN 16 6 - 20 mg/dL   Creatinine, Ser 0.94 0.44 - 1.00 mg/dL   Calcium 9.4 8.9 - 10.3 mg/dL   Phosphorus 3.9 2.5 - 4.6 mg/dL   Albumin 3.6 3.5 - 5.0 g/dL   GFR, Estimated >60 >60 mL/min    Comment: (NOTE) Calculated using the CKD-EPI Creatinine Equation (2021)    Anion gap 6 5 - 15    Comment: Performed at [facility], [place] [address] [place], [place] [address]   Glucose, capillary     Status: Abnormal   Collection Time: 01/07/21 11:59 AM  Result Value Ref Range   Glucose-Capillary 107 (H) 70 - 99 mg/dL    Comment: Glucose reference range applies only to samples taken after fasting for at least 8 hours.    Blood Alcohol level:  Lab Results  Component Value Date   ETH 11 (H) 01/01/2021   ETH <5 [id number]    Metabolic Disorder Labs: Lab Results  Component Value Date   HGBA1C 5.6 01/03/2021   MPG 114.02 01/03/2021   MPG 100 11/20/2015   Lab Results  Component Value Date   PROLACTIN 5.7 08/10/2015   PROLACTIN 141.3 (H) 07/21/2015   Lab Results  Component Value Date   CHOL 211 (H) 01/03/2021   TRIG 109 01/03/2021   HDL 56 01/03/2021   CHOLHDL 3.8 01/03/2021   VLDL 22 01/03/2021   LDLCALC 133 (H) 01/03/2021   LDLCALC 135 (H) 11/23/2019    Physical Findings: AIMS:NA CIWA:  CIWA-Ar Total: 0 COWS:   NA  Musculoskeletal: Strength & Muscle Tone: nml Gait & Station: normal Patient leans: N/A  Psychiatric Specialty Exam:  Presentation  General Appearance: Casual  Eye Contact:Fair  Speech:mumbling quality but normal fluency   Speech Volume:Normal  Handedness:Right   Mood and Affect  Mood:Depressed  Affect:constricted   Thought Process  Thought Processes:Coherent , superficially goal directed  Descriptions of Associations:Intact  Orientation:Full (Time, Place and Person)  Thought Content: denies AVH currently, no evidence of response to internal stimuli or paranoia on exam  History of Schizophrenia/Schizoaffective disorder:Yes  Duration of Psychotic Symptoms:Greater than six months  Hallucinations: Denied today  Ideas of Reference:None  Suicidal Thoughts:Denied Homicidal Thoughts:Homicidal Thoughts: No   Sensorium  Memory:Immediate Fair; Recent Fair; Remote Fair  Judgment: fair, taking prescribed medication, amenable to treatment plans Insight: poor, patient does not demonstrate insight in  dialogue about diagnoses or treatment plan  Executive Functions  Concentration:Fair  Attention Span:Fair  [place]   Psychomotor Activity  Psychomotor Activity:Psychomotor Activity: Normal   Assets  Assets:Social Support   Sleep  Sleep:Sleep: Fair Number of Hours of Sleep: 6.25   Physical Exam Vitals reviewed.  Constitutional:      Appearance: Normal appearance.  HENT:     Head: Normocephalic and atraumatic.  Pulmonary:     Effort: Pulmonary effort is normal.  Neurological:     General: No focal deficit present.     Mental Status: She is alert and oriented to person, place, and time.   Review of Systems  Respiratory:  Negative for shortness of breath.   Cardiovascular:  Negative for chest pain.  Neurological:  Positive for dizziness. Negative for headaches.  Blood pressure 126/67, pulse (!) 53, temperature 98.4 F (36.9 C), temperature source Oral, resp. rate 18, height '5\' 5"'$  (1.651 m), weight 97.1 kg, SpO2 99 %. Body mass index is 35.62 kg/m.  Treatment plan Maureen Swanson is a 70 YOF with a PPHx of Schizoaffective disorder, bipolar type, as well as MDD, presenting voluntarily after a suicide attempt via overdose.   Safety and Monitoring -- VOLUNTARY admission to inpatient psychiatric unit for safety, stabilization and treatment -- Daily contact with patient to assess and evaluate symptoms and progress in treatment -- Patient's case to be discussed in multi-disciplinary team meeting -- Observation Level : q15 minute checks -- Vital signs:  q12 hours -- Precautions: suicide   Schizoaffective d/o bipolar type by hx -Abilify maintena 400 mg IM given June 30th, next due July 29th -Should be able to re-administer before D/C Antipsychotic labs             EKG: NSR, Qtc 440 with LVH             Lipids: Chol 211, LDL 133             A1C: 5.6 AIMS: 0, no rigidity or cogwheeling -Continue Ingrezza 80 mg daily for previous  TD -Continue Prozac 10 mg daily (started 7/22) -Continue trazodone to 200 mg for sleep and  Melatonin '3mg'$  po qhs -Continue Congentin '1mg'$  qhs - continue Prazosin 1 mg for nightmares -Continue Neurontin '300mg'$  tid   Stimulant Use d/o - cocaine type Alcohol Use Disorder -CIWA: 1 mg Ativan q6hr PRN for score >10, patient currently scoring 0 -multivitamin and thiamine oral replacement - counseled on need to abstain from illicit substances and patient okay to go to [place] [facility] CBC: with mild anemia of 11.8 CMP: unremarkable EtOH of 11 mg/dL UDS with cocaine CBG <150 TSH 0.87  Bradycardia and HTN EKG 7/24 showing sinus brady, normal PR interval Medicine consultant recs:  -D/C atenolol given bradycardia and rechecking vitals q4 hours while awake.  Repeat EKG 7/25 sinus brady, w/out evidence of heart block -Continue HCTZ at 12.5 mg -Continue Amlodipine 10 mg, continue Hydralazine 50 mg q8 hrs -Checked ambulatory HR, 54->58 after walking briefly HR improving, from 40's to 50's, BP improving -  Renal function panel WNL and Mag+ pending  Type 2 DM FSBS qAC and daily in the AM, acceptable so far Continue metformin 1,000 mg bid  Miscellaneous -Continue albuterol for asthma, lipitor for HLD, Protonix for GERD, Vaseline for dry lips and legs   Continue PRN's: Tylenol, Maalox, Atarax, Milk of Magnesia, imodium, and [place] PGY-1, Psychiatry

## 2021-01-08 LAB — GLUCOSE, CAPILLARY
Glucose-Capillary: 90 mg/dL (ref 70–99)
Glucose-Capillary: 89 mg/dL (ref 70–99)
Glucose-Capillary: 111 mg/dL — ABNORMAL HIGH (ref 70–99)
Glucose-Capillary: 119 mg/dL — ABNORMAL HIGH (ref 70–99)

## 2021-01-08 MED ORDER — LOSARTAN POTASSIUM 50 MG PO TABS
50.0000 mg | ORAL_TABLET | Freq: Every day | ORAL | Status: DC
  Administered 2021-01-08 – 2021-01-09 (×2): 50 mg via ORAL
  Filled 2021-01-08 (×2): qty 1
  Filled 2021-01-08: qty 14
  Filled 2021-01-08: qty 1

## 2021-01-08 MED ORDER — HYDROCHLOROTHIAZIDE 25 MG PO TABS
25.0000 mg | ORAL_TABLET | Freq: Every day | ORAL | Status: DC
  Administered 2021-01-08 – 2021-01-09 (×2): 25 mg via ORAL
  Filled 2021-01-08 (×11): qty 1

## 2021-01-08 NOTE — Progress Notes (Signed)
[facility] MD Progress Note  01/08/2021 1:44 PM Maureen Swanson  MRN:  [id number] Subjective:   Maureen Swanson is a 45 YOF with a PPHx of Schizoaffective disorder, bipolar type, as well as MDD, presenting voluntarily after a suicide attempt via overdose.   24 hr events: no documented behavioral issues, no PRN medications given for agitation.  On interview this morning, patient reports a "bad" mood. She continues to deny recent AVH and SI, a significant improvement from admission. She complains of stomach fullness and distention. She denies ABD pain, N/V, and diarrhea. She reports a recent BM but thinks that she may still be constipated. She desires to try milk of mag and see if her symptoms improve. It is notable that this is her 2nd-3rd day of GI problems, which started after beginning Prozac. Patient reports no other complaints.     Principal Problem: Schizoaffective disorder, bipolar type ([place]) Diagnosis: Principal Problem:   Schizoaffective disorder, bipolar type ([place]) Active Problems:   Cocaine abuse ([place])  Total Time Spent in Direct Patient Care: I personally spent 30 minutes on the unit in direct patient care. The direct patient care time included face-to-face time with the patient, reviewing the patient's chart, communicating with other professionals, and coordinating care. Greater than 50% of this time was spent in counseling or coordinating care with the patient regarding goals of hospitalization, psycho-education, and discharge planning needs.   Past Psychiatric History: Schizoaffective disorder, bipolar type, depression  Past Medical History:  Past Medical History:  Diagnosis Date   Anxiety    Arthritis    back and knees   Asthma    daily and prn inhalers   Atypical ductal hyperplasia of breast 03/2012   right   Bipolar 1 disorder (HCC)    CHF (congestive heart failure) (HCC)    Depression    Diabetes mellitus    diet-controlled   Gastric ulcer    GERD (gastroesophageal reflux  disease)    Gout    Headache(784.0)    migraines   High cholesterol    Hypertension    under control, has been on med. x 12 yrs.   Substance abuse ([place])    crack cocaine:  extensive treatment with [facility] from 2014 to 2018 for this and alcohol abuse.   TMJ (temporomandibular joint disorder)     Past Surgical History:  Procedure Laterality Date   BREAST LUMPECTOMY WITH NEEDLE LOCALIZATION  04/19/2012   Procedure: BREAST LUMPECTOMY WITH NEEDLE LOCALIZATION;  Surgeon: [name], MD;  Location: [place];  Service: General;  Laterality: Right;   KNEE ARTHROSCOPY W/ PARTIAL MEDIAL MENISCECTOMY  05/01/2010   right   Family History:  Family History  Problem Relation Age of Onset   Diabetes Mother    Breast cancer Mother    Cancer Father    Bipolar disorder Maternal Aunt    Schizophrenia Maternal Grandfather    Alcoholism Maternal Uncle    Family Psychiatric  History: See H and P Social History: previously reported by patient, different than current assessment. On that assessment patient admitted to more significant drug use. Social History   Substance and Sexual Activity  Alcohol Use Yes   Comment: 3x 40 oz beers/day     Social History   Substance and Sexual Activity  Drug Use Yes   Frequency: 5.0 times per week   Types: Cocaine, "Crack" cocaine    Social History   Socioeconomic History   Marital status:  Single    Spouse name: Not on file   Number of children: Not on file   Years of education: Not on file   Highest education level: Not on file  Occupational History   Not on file  Tobacco Use   Smoking status: Former    Packs/day: 1.00    Years: 0.00    Pack years: 0.00    Types: Cigarettes   Smokeless tobacco: Never  Vaping Use   Vaping Use: Never used  Substance and Sexual Activity   Alcohol use: Yes    Comment: 3x 40 oz beers/day   Drug use: Yes    Frequency: 5.0 times per week    Types: Cocaine,  "Crack" cocaine   Sexual activity: Not Currently    Birth control/protection: Post-menopausal  Other Topics Concern   Not on file  Social History Narrative   Not on file   Social Determinants of Health   Financial Resource Strain: Not on file  Food Insecurity: Not on file  Transportation Needs: Not on file  Physical Activity: Not on file  Stress: Not on file  Social Connections: Not on file   Additional Social History:    She reports doing illegal drugs (mainly cocaine) 2x per month and drinking one "40 oz" per day.   Sleep: not recorded  Appetite:  Good  Current Medications: Current Facility-Administered Medications  Medication Dose Route Frequency Provider Last Rate Last Admin   acetaminophen (TYLENOL) tablet 650 mg  650 mg Oral Q6H PRN Chalmers Guest, NP       albuterol (VENTOLIN HFA) 108 (90 Base) MCG/ACT inhaler 1 puff  1 puff Inhalation Q4H PRN Nwoko, Herbert Pun I, NP       alum & mag hydroxide-simeth (MAALOX/MYLANTA) 200-200-20 MG/5ML suspension 30 mL  30 mL Oral Q4H PRN Chalmers Guest, NP       amLODipine (NORVASC) tablet 10 mg  10 mg Oral Daily Kyle, Tyrone A, DO   10 mg at 01/08/21 0826   atorvastatin (LIPITOR) tablet 40 mg  40 mg Oral QHS Chalmers Guest, NP   40 mg at 01/07/21 2123   benztropine (COGENTIN) tablet 1 mg  1 mg Oral QHS Nelda Marseille, Amy E, MD   1 mg at 01/07/21 2123   FLUoxetine (PROZAC) capsule 10 mg  10 mg Oral Daily Corky Sox, MD   10 mg at 01/08/21 0827   gabapentin (NEURONTIN) capsule 300 mg  300 mg Oral TID Harlow Asa, MD   300 mg at 01/08/21 1126   hydrALAZINE (APRESOLINE) tablet 50 mg  50 mg Oral Q8H Kyle, Tyrone A, DO   50 mg at 01/08/21 [id number]   hydrochlorothiazide (HYDRODIURIL) tablet 25 mg  25 mg Oral Daily Wendee Beavers T, MD   25 mg at 01/08/21 [id number]   hydrOXYzine (ATARAX/VISTARIL) tablet 25 mg  25 mg Oral Q6H PRN Harlow Asa, MD   25 mg at 01/07/21 1955   loperamide (IMODIUM) capsule 2-4 mg  2-4 mg Oral PRN Harlow Asa, MD        LORazepam (ATIVAN) tablet 1 mg  1 mg Oral Q6H PRN Harlow Asa, MD       losartan (COZAAR) tablet 50 mg  50 mg Oral Daily Wendee Beavers T, MD   50 mg at 01/08/21 0827   magnesium hydroxide (MILK OF MAGNESIA) suspension 30 mL  30 mL Oral Daily PRN Chalmers Guest, NP       melatonin tablet 3 mg  3  mg Oral QHS Viann Fish E, MD   3 mg at 01/07/21 2122   meloxicam (MOBIC) tablet 15 mg  15 mg Oral Daily Viann Fish E, MD   15 mg at 01/08/21 [id number]   metFORMIN (GLUCOPHAGE) tablet 1,000 mg  1,000 mg Oral BID WC Nelda Marseille, Amy E, MD   1,000 mg at 01/08/21 0827   multivitamin with minerals tablet 1 tablet  1 tablet Oral Daily Harlow Asa, MD   1 tablet at 01/08/21 0827   ondansetron (ZOFRAN-ODT) disintegrating tablet 4 mg  4 mg Oral Q6H PRN Harlow Asa, MD       pantoprazole (PROTONIX) EC tablet 40 mg  40 mg Oral Daily Chalmers Guest, NP   40 mg at 01/08/21 0827   prazosin (MINIPRESS) capsule 1 mg  1 mg Oral QHS Chalmers Guest, NP   1 mg at 01/07/21 2123   thiamine tablet 100 mg  100 mg Oral Daily Nelda Marseille, Amy E, MD   100 mg at 01/08/21 [id number]   traZODone (DESYREL) tablet 200 mg  200 mg Oral QHS PRN Corky Sox, MD   200 mg at 01/07/21 2124   valbenazine (INGREZZA) capsule 80 mg  80 mg Oral QHS Chalmers Guest, NP   80 mg at 01/07/21 2122   white petrolatum (VASELINE) gel   Topical PRN Corky Sox, MD        Lab Results:  Results for orders placed or performed during the hospital encounter of 01/02/21 (from the past 48 hour(s))  Glucose, capillary     Status: Abnormal   Collection Time: 01/06/21  5:06 PM  Result Value Ref Range   Glucose-Capillary 105 (H) 70 - 99 mg/dL    Comment: Glucose reference range applies only to samples taken after fasting for at least 8 hours.  Glucose, capillary     Status: Abnormal   Collection Time: 01/06/21  8:58 PM  Result Value Ref Range   Glucose-Capillary 130 (H) 70 - 99 mg/dL    Comment: Glucose reference range applies only to samples  taken after fasting for at least 8 hours.  Glucose, capillary     Status: Abnormal   Collection Time: 01/07/21  6:05 AM  Result Value Ref Range   Glucose-Capillary 100 (H) 70 - 99 mg/dL    Comment: Glucose reference range applies only to samples taken after fasting for at least 8 hours.   Comment 1 Notify RN    Comment 2 Document in Chart   Renal function panel     Status: None   Collection Time: 01/07/21  6:29 AM  Result Value Ref Range   Sodium 144 135 - 145 mmol/L   Potassium 4.0 3.5 - 5.1 mmol/L   Chloride 107 98 - 111 mmol/L   CO2 31 22 - 32 mmol/L   Glucose, Bld 98 70 - 99 mg/dL    Comment: Glucose reference range applies only to samples taken after fasting for at least 8 hours.   BUN 16 6 - 20 mg/dL   Creatinine, Ser 0.94 0.44 - 1.00 mg/dL   Calcium 9.4 8.9 - 10.3 mg/dL   Phosphorus 3.9 2.5 - 4.6 mg/dL   Albumin 3.6 3.5 - 5.0 g/dL   GFR, Estimated >60 >60 mL/min    Comment: (NOTE) Calculated using the CKD-EPI Creatinine Equation (2021)    Anion gap 6 5 - 15    Comment: Performed at [facility], [place] [address], [place], [place] [address]  Glucose, capillary  Status: Abnormal   Collection Time: 01/07/21 11:59 AM  Result Value Ref Range   Glucose-Capillary 107 (H) 70 - 99 mg/dL    Comment: Glucose reference range applies only to samples taken after fasting for at least 8 hours.  Glucose, capillary     Status: Abnormal   Collection Time: 01/07/21  5:05 PM  Result Value Ref Range   Glucose-Capillary 115 (H) 70 - 99 mg/dL    Comment: Glucose reference range applies only to samples taken after fasting for at least 8 hours.  Magnesium     Status: None   Collection Time: 01/07/21  6:21 PM  Result Value Ref Range   Magnesium 1.8 1.7 - 2.4 mg/dL    Comment: Performed at [facility], [place] [address], [place], [place] [address]  Glucose, capillary     Status: None   Collection Time: 01/08/21  5:51 AM  Result Value Ref Range    Glucose-Capillary 90 70 - 99 mg/dL    Comment: Glucose reference range applies only to samples taken after fasting for at least 8 hours.   Comment 1 Notify RN    Comment 2 Document in Chart   Glucose, capillary     Status: None   Collection Time: 01/08/21 11:22 AM  Result Value Ref Range   Glucose-Capillary 89 70 - 99 mg/dL    Comment: Glucose reference range applies only to samples taken after fasting for at least 8 hours.    Blood Alcohol level:  Lab Results  Component Value Date   ETH 11 (H) 01/01/2021   ETH <5 [id number]    Metabolic Disorder Labs: Lab Results  Component Value Date   HGBA1C 5.6 01/03/2021   MPG 114.02 01/03/2021   MPG 100 11/20/2015   Lab Results  Component Value Date   PROLACTIN 5.7 08/10/2015   PROLACTIN 141.3 (H) 07/21/2015   Lab Results  Component Value Date   CHOL 211 (H) 01/03/2021   TRIG 109 01/03/2021   HDL 56 01/03/2021   CHOLHDL 3.8 01/03/2021   VLDL 22 01/03/2021   LDLCALC 133 (H) 01/03/2021   LDLCALC 135 (H) 11/23/2019    Physical Findings: AIMS:NA CIWA:  CIWA-Ar Total: 0 COWS:   NA  Musculoskeletal: Strength & Muscle Tone: nml Gait & Station: normal Patient leans: N/A  Psychiatric Specialty Exam:  Presentation  General Appearance: Casual  Eye Contact:Fair  Speech:mumbling quality but normal fluency   Speech Volume:Normal  Handedness:Right   Mood and Affect  Mood:Depressed  Affect:constricted   Thought Process  Thought Processes:Coherent , superficially goal directed  Descriptions of Associations:Intact  Orientation:Full (Time, Place and Person)  Thought Content: denies AVH currently, no evidence of response to internal stimuli or paranoia on exam  History of Schizophrenia/Schizoaffective disorder:Yes  Duration of Psychotic Symptoms:Greater than six months  Hallucinations: Denied today  Ideas of Reference:None  Suicidal Thoughts:Denied Homicidal Thoughts:No data recorded   Sensorium   Memory:Immediate Fair; Recent Fair; Remote Fair  Judgment: fair, taking prescribed medication, amenable to treatment plans Insight: poor, patient does not demonstrate insight in dialogue about diagnoses or treatment plan  Executive Functions  Concentration:Fair  Attention Span:Fair  [place]   Psychomotor Activity  Psychomotor Activity:No data recorded   Assets  Assets:Social Support   Sleep  Sleep:No data recorded   Physical Exam Vitals reviewed.  Constitutional:      Appearance: Normal appearance.  HENT:     Head: Normocephalic and atraumatic.  Pulmonary:  Effort: Pulmonary effort is normal.  Neurological:     General: No focal deficit present.     Mental Status: She is alert and oriented to person, place, and time.  ABD exam: mildly TTP diffusely, non-rigid with no guarding  Review of Systems  Respiratory:  Negative for shortness of breath.   Cardiovascular:  Negative for chest pain.  Neurological:  Positive for dizziness. Negative for headaches.  Blood pressure (!) 158/87, pulse (!) 50, temperature 98.6 F (37 C), temperature source Oral, resp. rate 18, height '5\' 5"'$  (1.651 m), weight 97.1 kg, SpO2 98 %. Body mass index is 35.62 kg/m.  Treatment plan Cabella Angeloff is a 81 YOF with a PPHx of Schizoaffective disorder, bipolar type, as well as MDD, presenting voluntarily after a suicide attempt via overdose.   Safety and Monitoring -- VOLUNTARY admission to inpatient psychiatric unit for safety, stabilization and treatment -- Daily contact with patient to assess and evaluate symptoms and progress in treatment -- Patient's case to be discussed in multi-disciplinary team meeting -- Observation Level : q15 minute checks -- Vital signs:  q12 hours -- Precautions: suicide   Schizoaffective d/o bipolar type by hx -Abilify maintena 400 mg IM given June 30th, next due July 29th -Should be able to re-administer before  D/C Antipsychotic labs             EKG: NSR, Qtc 440 with LVH             Lipids: Chol 211, LDL 133             A1C: 5.6 AIMS: 0, no rigidity or cogwheeling -Continue Ingrezza 80 mg daily for previous TD -Prozac 10 mg - D/C'd given GI side effects -Continue trazodone to 200 mg for sleep and  Melatonin '3mg'$  po qhs -Continue Congentin '1mg'$  qhs - continue Prazosin 1 mg for nightmares -Continue Neurontin '300mg'$  tid   Stimulant Use d/o - cocaine type Alcohol Use Disorder -CIWA: 1 mg Ativan q6hr PRN for score >10, patient currently scoring 0 -multivitamin and thiamine oral replacement - counseled on need to abstain from illicit substances and patient okay to go to [facility] CBC: with mild anemia of 11.8 CMP: unremarkable EtOH of 11 mg/dL UDS with cocaine CBG <150 TSH 0.87  Bradycardia and HTN EKG 7/24 showing sinus brady, normal PR interval Medicine consultant recs:  -D/C atenolol given bradycardia and rechecking vitals q4 hours while awake.  Repeat EKG 7/25 sinus brady, w/out evidence of heart block -Continue HCTZ at 12.5 mg -Continue Amlodipine 10 mg, continue Hydralazine 50 mg q8 hrs -Checked ambulatory HR, 54->58 after walking briefly HR improving, from 40's to 50's, BP improving - Renal function panel WNL and Mag+ WNL - CTM HR and BP with q4 vitals  Type 2 DM FSBS qAC and daily in the AM, acceptable so far Continue metformin 1,000 mg bid, possible contributor to GI problems as patient was non-compliant as an outpatient  Miscellaneous -Continue albuterol for asthma, lipitor for HLD, Protonix for GERD, Vaseline for dry lips and legs   Continue PRN's: Tylenol, Maalox, Atarax, Milk of Magnesia, imodium, and [place] PGY-1, Psychiatry

## 2021-01-08 NOTE — [facility] Group Notes (Signed)
[facility] Group Notes:  (Nursing/MHT/Case Management/Adjunct)  Date:  01/08/2021  Time:  8:43 PM  Type of Therapy:  Group Therapy  Participation Level:  Did Not Attend  Participation Quality:   na  Affect:   na  Cognitive:   na  Insight:  None  Engagement in Group:   na  Modes of Intervention:   na  Summary of Progress/Problems:  Maureen Swanson 01/08/2021, 8:43 PM

## 2021-01-08 NOTE — Progress Notes (Signed)
   01/08/21 0105  Psych Admission Type (Psych Patients Only)  Admission Status Voluntary  Psychosocial Assessment  Patient Complaints None  Eye Contact Fair  Facial Expression Sad  Affect Appropriate to circumstance  Speech Logical/coherent  Interaction Assertive  Motor Activity Slow  Appearance/Hygiene Improved  Behavior Characteristics Cooperative  Mood Pleasant  Thought Process  Coherency Concrete thinking  Content Blaming others  Delusions None reported or observed  Perception WDL  Hallucination None reported or observed  Judgment WDL  Confusion None  Danger to Self  Current suicidal ideation? Denies  Danger to Others  Danger to Others None reported or observed  D: Patient c/o of N/V and anxiety. Pt stated she felt well till after dinner when she became nauseous and had elevated BP. A: Medications administered as prescribed. Support and encouragement provided as needed.  R: Patient  stated she relieved after medication given. Will continue to monitor for safety and stability.

## 2021-01-08 NOTE — [facility] Group Notes (Signed)
Pt didn't attend group due to not feeling well.

## 2021-01-08 NOTE — Progress Notes (Signed)
Pt stated she was doing better, pt stayed in her room much of the evening. Pt given PRN Trazodone per MAR with HS medication   01/08/21 2300  Psych Admission Type (Psych Patients Only)  Admission Status Voluntary  Psychosocial Assessment  Patient Complaints None  Eye Contact Fair  Facial Expression Flat  Affect Appropriate to circumstance  Speech Logical/coherent  Interaction Assertive  Motor Activity Slow  Appearance/Hygiene Improved  Behavior Characteristics Cooperative  Mood Anxious;Pleasant  Thought Process  Coherency Concrete thinking  Content Blaming others  Delusions None reported or observed  Perception WDL  Hallucination None reported or observed  Judgment WDL  Confusion None  Danger to Self  Current suicidal ideation? Denies  Danger to Others  Danger to Others None reported or observed

## 2021-01-08 NOTE — Progress Notes (Signed)
Did not attend group 

## 2021-01-08 NOTE — Progress Notes (Signed)
PROGRESS NOTE  Maureen Swanson [id number] DOB: [birth year]   PCP: Mack Hook, MD  Patient is from: [facility]  DOA: 01/02/2021 LOS: 6  Chief complaints:  Chief Complaint  Patient presents with   MDD     Brief Narrative / Interim history: 55 year old F with PMH of HTN, DM-2, BPD, depression, substance abuse, OSA not on CPAP and class II obesity admitted to [facility] with suicidal ideation.  Hospitalist service consulted for bradycardia and hypertension.  EKG with sinus bradycardia without AV block.  TSH within normal.  Atenolol discontinued.  Antihypertensive meds adjusted.  BP and HR improved.   Objective: Vitals:   01/08/21 0556 01/08/21 0620 01/08/21 0834 01/08/21 1046  BP: (!) 182/113 (!) 160/100 (!) 148/88 (!) 158/87  Pulse: 67  (!) 57 (!) 50  Resp:      Temp:      TempSrc:      SpO2: 98%     Weight:      Height:       No intake or output data in the 24 hours ending 01/08/21 1205 Filed Weights   01/02/21 1530 01/02/21 1730  Weight: 97.1 kg 97.1 kg    Examination:  Patient is at [facility]  Procedures:  None  Assessment & Plan: Sinus bradycardia-bradycardia improved after discontinuing atenolol.  Positive chronotropic response to exertion.  EKG with sinus bradycardia and T wave changes in lateral leads but no AV block.  TSH and electrolytes within normal.  T wave changes are not new.   Uncontrolled hypertension: BP elevated earlier in the morning but seems to be improving. -Atenolol discontinued due to bradycardia -Amlodipine 10 mg daily, hydralazine 50 mg 3 times daily, prazosin 1 mg daily and HCTZ 12.5 mg daily -On losartan 50 mg daily -Recheck renal function in 2 to 3 days -If she becomes tachycardic, we can resume beta-blocker at lower dose. -She would benefit from CPAP but does not use at home.  History of sleep apnea: Not on CPAP -Could definitely contribute to her BP  Controlled NIDDM-2: CBG within acceptable range. Recent Labs  Lab 01/07/21 0605  01/07/21 1159 01/07/21 1705 01/08/21 0551 01/08/21 1122  GLUCAP 100* 107* 115* 90 89  -Continue home metformin. -I do not think she needs CBG monitoring  MDD/BPD/suicidal ideation -Per primary.  Class II obesity Body mass index is 35.62 kg/m.          Sch Meds:  Scheduled Meds:  amLODipine  10 mg Oral Daily   atorvastatin  40 mg Oral QHS   benztropine  1 mg Oral QHS   FLUoxetine  10 mg Oral Daily   gabapentin  300 mg Oral TID   hydrALAZINE  50 mg Oral Q8H   hydrochlorothiazide  25 mg Oral Daily   losartan  50 mg Oral Daily   melatonin  3 mg Oral QHS   meloxicam  15 mg Oral Daily   metFORMIN  1,000 mg Oral BID WC   multivitamin with minerals  1 tablet Oral Daily   pantoprazole  40 mg Oral Daily   prazosin  1 mg Oral QHS   thiamine  100 mg Oral Daily   valbenazine  80 mg Oral QHS   Continuous Infusions: PRN Meds:.acetaminophen, albuterol, alum & mag hydroxide-simeth, hydrOXYzine, loperamide, LORazepam, magnesium hydroxide, ondansetron, traZODone, white petrolatum  Antimicrobials: Anti-infectives (From admission, onward)    None        I have personally reviewed the following labs and images: CBC: No results for input(s): WBC, NEUTROABS,  HGB, HCT, MCV, PLT in the last 168 hours.  BMP &GFR Recent Labs  Lab 01/07/21 0629 01/07/21 1821  NA 144  --   K 4.0  --   CL 107  --   CO2 31  --   GLUCOSE 98  --   BUN 16  --   CREATININE 0.94  --   CALCIUM 9.4  --   MG  --  1.8  PHOS 3.9  --    Estimated Creatinine Clearance: 77.9 mL/min (by C-G formula based on SCr of 0.94 mg/dL). Liver & Pancreas: Recent Labs  Lab 01/07/21 0629  ALBUMIN 3.6   No results for input(s): LIPASE, AMYLASE in the last 168 hours. No results for input(s): AMMONIA in the last 168 hours. Diabetic: No results for input(s): HGBA1C in the last 72 hours. Recent Labs  Lab 01/07/21 0605 01/07/21 1159 01/07/21 1705 01/08/21 0551 01/08/21 1122  GLUCAP 100* 107* 115* 90 89    Cardiac Enzymes: No results for input(s): CKTOTAL, CKMB, CKMBINDEX, TROPONINI in the last 168 hours. No results for input(s): PROBNP in the last 8760 hours. Coagulation Profile: No results for input(s): INR, PROTIME in the last 168 hours. Thyroid Function Tests: No results for input(s): TSH, T4TOTAL, FREET4, T3FREE, THYROIDAB in the last 72 hours. Lipid Profile: No results for input(s): CHOL, HDL, LDLCALC, TRIG, CHOLHDL, LDLDIRECT in the last 72 hours. Anemia Panel: No results for input(s): VITAMINB12, FOLATE, FERRITIN, TIBC, IRON, RETICCTPCT in the last 72 hours. Urine analysis:    Component Value Date/Time   COLORURINE STRAW (A) 01/01/2021 1455   APPEARANCEUR CLEAR 01/01/2021 1455   LABSPEC 1.006 01/01/2021 1455   PHURINE 6.0 01/01/2021 1455   GLUCOSEU NEGATIVE 01/01/2021 1455   HGBUR NEGATIVE 01/01/2021 1455   HGBUR negative 04/08/2010 1006   BILIRUBINUR NEGATIVE 01/01/2021 1455   KETONESUR NEGATIVE 01/01/2021 1455   PROTEINUR NEGATIVE 01/01/2021 1455   UROBILINOGEN 0.2 04/04/2015 0611   NITRITE NEGATIVE 01/01/2021 1455   LEUKOCYTESUR NEGATIVE 01/01/2021 1455   Sepsis Labs: Invalid input(s): PROCALCITONIN, [place]  Microbiology: Recent Results (from the past 240 hour(s))  Resp Panel by RT-PCR (Flu A&B, Covid) Nasopharyngeal Swab     Status: None   Collection Time: 01/01/21  4:17 PM   Specimen: Nasopharyngeal Swab; Nasopharyngeal(NP) swabs in vial transport medium  Result Value Ref Range Status   SARS Coronavirus 2 by RT PCR NEGATIVE NEGATIVE Final    Comment: (NOTE) SARS-CoV-2 target nucleic acids are NOT DETECTED.  The SARS-CoV-2 RNA is generally detectable in upper respiratory specimens during the acute phase of infection. The lowest concentration of SARS-CoV-2 viral copies this assay can detect is 138 copies/mL. A negative result does not preclude SARS-Cov-2 infection and should not be used as the sole basis for treatment or other patient management  decisions. A negative result may occur with  improper specimen collection/handling, submission of specimen other than nasopharyngeal swab, presence of viral mutation(s) within the areas targeted by this assay, and inadequate number of viral copies(<138 copies/mL). A negative result must be combined with clinical observations, patient history, and epidemiological information. The expected result is Negative.  Fact Sheet for Patients:  EntrepreneurPulse.com.au  Fact Sheet for Healthcare Providers:  IncredibleEmployment.be  This test is no t yet approved or cleared by the [place] FDA and  has been authorized for detection and/or diagnosis of SARS-CoV-2 by FDA under an Emergency Use Authorization (EUA). This EUA will remain  in effect (meaning this test can be used) for the duration of the  COVID-19 declaration under Section 564(b)(1) of the Act, 21 U.S.C.section 360bbb-3(b)(1), unless the authorization is terminated  or revoked sooner.       Influenza A by PCR NEGATIVE NEGATIVE Final   Influenza B by PCR NEGATIVE NEGATIVE Final    Comment: (NOTE) The Xpert Xpress SARS-CoV-2/FLU/RSV plus assay is intended as an aid in the diagnosis of influenza from Nasopharyngeal swab specimens and should not be used as a sole basis for treatment. Nasal washings and aspirates are unacceptable for Xpert Xpress SARS-CoV-2/FLU/RSV testing.  Fact Sheet for Patients: EntrepreneurPulse.com.au  Fact Sheet for Healthcare Providers: IncredibleEmployment.be  This test is not yet approved or cleared by the [place] FDA and has been authorized for detection and/or diagnosis of SARS-CoV-2 by FDA under an Emergency Use Authorization (EUA). This EUA will remain in effect (meaning this test can be used) for the duration of the COVID-19 declaration under Section 564(b)(1) of the Act, 21 U.S.C. section 360bbb-3(b)(1), unless the  authorization is terminated or revoked.  Performed at [facility], [place] [address], [place], [place] [address]     Radiology Studies: No results found.    Ashten Sarnowski T. [place]  If 7PM-7AM, please contact night-coverage [email or web address] 01/08/2021, 12:05 PM

## 2021-01-08 NOTE — Progress Notes (Signed)
Recreation Therapy Notes  Animal-Assisted Activity (AAA) Program Checklist/Progress Notes Patient Eligibility Criteria Checklist & Daily Group note for Rec Tx Intervention  Date: 7.26.22 Time: 74 Location: [place]  AAA/T Program Assumption of Risk Form signed by Teacher, music or Parent Legal Guardian  YES   Patient is free of allergies or severe asthma YES   Patient reports no fear of animals YES  Patient reports no history of cruelty to animals YES  Patient understands his/her participation is voluntary YES  Patient washes hands before animal contact  YES  Patient washes hands after animal contact  YES  Education: Contractor, Appropriate Animal Interaction   Education Outcome: Acknowledges understanding/In group clarification offered/Needs additional education.   Clinical Observations/Feedback: Pt did not attend group session.    Victorino Sparrow, LRT/CTRS        Victorino Sparrow A 01/08/2021 3:19 PM

## 2021-01-09 DIAGNOSIS — F1410 Cocaine abuse, uncomplicated: Secondary | ICD-10-CM

## 2021-01-09 LAB — GLUCOSE, CAPILLARY
Glucose-Capillary: 105 mg/dL — ABNORMAL HIGH (ref 70–99)
Glucose-Capillary: 106 mg/dL — ABNORMAL HIGH (ref 70–99)
Glucose-Capillary: 104 mg/dL — ABNORMAL HIGH (ref 70–99)
Glucose-Capillary: 123 mg/dL — ABNORMAL HIGH (ref 70–99)

## 2021-01-09 MED ORDER — BENZTROPINE MESYLATE 1 MG PO TABS: 1.0000 mg | ORAL_TABLET | Freq: Every day | ORAL | 0 refills | Status: DC

## 2021-01-09 MED ORDER — ARIPIPRAZOLE ER 400 MG IM SRER
400.0000 mg | INTRAMUSCULAR | Status: DC
  Administered 2021-01-10: 400 mg via INTRAMUSCULAR

## 2021-01-09 MED ORDER — GABAPENTIN 300 MG PO CAPS: 300.0000 mg | ORAL_CAPSULE | Freq: Three times a day (TID) | ORAL | 0 refills | Status: DC

## 2021-01-09 MED ORDER — MELOXICAM 15 MG PO TABS: 15.0000 mg | ORAL_TABLET | Freq: Every day | ORAL | 0 refills | Status: DC

## 2021-01-09 MED ORDER — PANTOPRAZOLE SODIUM 40 MG PO TBEC: 40.0000 mg | DELAYED_RELEASE_TABLET | Freq: Every day | ORAL | 0 refills | Status: DC

## 2021-01-09 MED ORDER — ABILIFY MAINTENA 400 MG IM PRSY: 400.0000 mg | PREFILLED_SYRINGE | INTRAMUSCULAR | Status: DC

## 2021-01-09 MED ORDER — AMLODIPINE BESYLATE 10 MG PO TABS: 10.0000 mg | ORAL_TABLET | Freq: Every day | ORAL | 0 refills | Status: DC

## 2021-01-09 MED ORDER — ATORVASTATIN CALCIUM 40 MG PO TABS: 40.0000 mg | ORAL_TABLET | Freq: Every day | ORAL | 0 refills | Status: DC

## 2021-01-09 MED ORDER — ALBUTEROL SULFATE HFA 108 (90 BASE) MCG/ACT IN AERS: 1.0000 | INHALATION_SPRAY | RESPIRATORY_TRACT | 0 refills | Status: DC | PRN

## 2021-01-09 MED ORDER — HYDROCHLOROTHIAZIDE 25 MG PO TABS: 25.0000 mg | ORAL_TABLET | Freq: Every day | ORAL | 0 refills | Status: DC

## 2021-01-09 MED ORDER — HYDRALAZINE HCL 50 MG PO TABS: 50.0000 mg | ORAL_TABLET | Freq: Three times a day (TID) | ORAL | 0 refills | Status: DC

## 2021-01-09 MED ORDER — LOSARTAN POTASSIUM 50 MG PO TABS: 50.0000 mg | ORAL_TABLET | Freq: Every day | ORAL | 0 refills | Status: DC

## 2021-01-09 MED ORDER — MELATONIN 3 MG PO TABS: 3.0000 mg | ORAL_TABLET | Freq: Every day | ORAL | 0 refills | Status: AC

## 2021-01-09 MED ORDER — INGREZZA 80 MG PO CAPS: 80.0000 mg | ORAL_CAPSULE | Freq: Every day | ORAL | 0 refills | Status: DC

## 2021-01-09 MED ORDER — METFORMIN HCL 1000 MG PO TABS: 1000.0000 mg | ORAL_TABLET | Freq: Two times a day (BID) | ORAL | 0 refills | Status: DC

## 2021-01-09 MED ORDER — TRAZODONE HCL 100 MG PO TABS: 200.0000 mg | ORAL_TABLET | Freq: Every evening | ORAL | 0 refills | Status: DC | PRN

## 2021-01-09 MED ORDER — VALBENAZINE TOSYLATE 80 MG PO CAPS
80.0000 mg | ORAL_CAPSULE | Freq: Every day | ORAL | Status: DC
  Filled 2021-01-09 (×2): qty 1

## 2021-01-09 NOTE — BHH Suicide Risk Assessment (Signed)
[facility] Discharge Suicide Risk Assessment   Principal Problem: Schizoaffective disorder, bipolar type ([place]) Discharge Diagnoses: Principal Problem:   Schizoaffective disorder, bipolar type ([place]) Active Problems:   Cocaine abuse ([place])   Total Time spent with patient: 15 minutes  Musculoskeletal: Strength & Muscle Tone: within normal limits Gait & Station: normal Patient leans: N/A  Psychiatric Specialty Exam  Presentation  General Appearance: Casual  Eye Contact:Good  Speech:-- (clear but garbled at points)  Speech Volume:Normal  Handedness:Right   Mood and Affect  Mood:Euthymic  Duration of Depression Symptoms: No data recorded Affect:Depressed   Thought Process  Thought Processes:Coherent; Linear; Goal Directed  Descriptions of Associations:Intact  Orientation:Full (Time, Place and Person)  Thought Content:Logical  History of Schizophrenia/Schizoaffective disorder:Yes  Duration of Psychotic Symptoms:Greater than six months  Hallucinations:Description of Command Hallucinations: See H and P Description of Auditory Hallucinations: See H and P Description of Visual Hallucinations: See H and P  Ideas of Reference:None  Suicidal Thoughts:Suicidal Thoughts: No  Homicidal Thoughts:Homicidal Thoughts: No   Sensorium  Memory:Immediate Fair; Recent Fair; Remote Fair  Judgment:Poor  Insight:Fair   Executive Functions  Concentration:Fair  Attention Span:Fair  [place]   Psychomotor Activity  Psychomotor Activity:Psychomotor Activity: Normal   Assets  Assets:-- (NA)   Sleep  Sleep:Sleep: Fair Number of Hours of Sleep: 6.75   Physical Exam: Physical Exam Vitals and nursing note reviewed.  Constitutional:      Appearance: She is obese.  HENT:     Head: Normocephalic and atraumatic.  Pulmonary:     Effort: Pulmonary effort is normal.  Neurological:     General: No focal deficit present.      Mental Status: She is alert and oriented to person, place, and time.   Review of Systems  All other systems reviewed and are negative. Blood pressure 140/80, pulse (!) 55, temperature 98.6 F (37 C), temperature source Oral, resp. rate 18, height '5\' 5"'$  (1.651 m), weight 97.1 kg, SpO2 93 %. Body mass index is 35.62 kg/m.  Mental Status Per Nursing Assessment::   On Admission:  Suicidal ideation indicated by patient, Plan includes specific time, place, or method, Intention to act on suicide plan, Suicidal ideation indicated by others, Belief that plan would result in death, Suicide plan  Demographic Factors:  Low socioeconomic status, Living alone, and Unemployed  Loss Factors: Financial problems/change in socioeconomic status  Historical Factors: Impulsivity  Risk Reduction Factors:   NA  Continued Clinical Symptoms:  Alcohol/Substance Abuse/Dependencies Schizophrenia:   Depressive state  Cognitive Features That Contribute To Risk:  Thought constriction (tunnel vision)    Suicide Risk:  Minimal: No identifiable suicidal ideation.  Patients presenting with no risk factors but with morbid ruminations; may be classified as minimal risk based on the severity of the depressive symptoms   Follow-up Information     [facility]. Call.   Specialty: Hospice and Palliative Medicine Why: Please call this provider personally to schedule an appointment for bereavement/grief counseling services. Contact information: [place] Follow up.   Specialty: Behavioral Health Why: You may go to this provider for therapy and medication management services during walk in hours:  Monday through Wednesday from 8:00 am to 11:00 am.  Services are provided on a first come, first served basis. Contact information: [place] [phone]        Services,  [facility] Follow up.   Why: You may go to this provider for residential substance use treatment services. You have been accepted to [facility] 28 day program. You have an appointment on 01/10/21 at [place] for admit Contact information: [address] [place] [address] [phone]         [facility] Follow up.   Why: Continue with your ACTT team. Contact information: [place] [address] [phone]                 Plan Of Care/Follow-up recommendations:  Activity:    Ad lib.  Sharma Covert, MD 01/09/2021, 3:38 PM

## 2021-01-09 NOTE — BHH Group Notes (Signed)
Type of Therapy and Topic:  Group Therapy - Healthy vs Unhealthy Coping Skills  Participation Level:  Did Not Attend   Description of Group The focus of this group was to determine what unhealthy coping techniques typically are used by group members and what healthy coping techniques would be helpful in coping with various problems. Patients were guided in becoming aware of the differences between healthy and unhealthy coping techniques. Patients were asked to identify 2-3 healthy coping skills they would like to learn to use more effectively.  Therapeutic Goals Patients learned that coping is what human beings do all day long to deal with various situations in their lives Patients defined and discussed healthy vs unhealthy coping techniques Patients identified their preferred coping techniques and identified whether these were healthy or unhealthy Patients determined 2-3 healthy coping skills they would like to become more familiar with and use more often. Patients provided support and ideas to each other   Summary of Patient Progress:  Did not attend

## 2021-01-09 NOTE — Progress Notes (Signed)
  [facility] Adult Case Management Discharge Plan :  Will you be returning to the same living situation after discharge:  No. At discharge, do you have transportation home?: No. Patient will use safe transport to get to [place] you have the ability to pay for your medications: Yes,  insurance  Release of information consent forms completed and in the chart;  Patient's signature needed at discharge.  Patient to Follow up at:  Follow-up Information     [facility]. Call.   Specialty: Hospice and Palliative Medicine Why: Please call this provider personally to schedule an appointment for bereavement/grief counseling services. Contact information: [place] Follow up.   Specialty: Behavioral Health Why: You may go to this provider for therapy and medication management services during walk in hours:  Monday through Wednesday from 8:00 am to 11:00 am.  Services are provided on a first come, first served basis. Contact information: [place] [phone]        Services, [facility] Follow up.   Why: You may go to this provider for residential substance use treatment services. Contact information: [place] [address] [place] Follow up.   Why: Continue with your ACTT team. Contact information: [facility] [address] [phone]                 Next level of care provider has access to [place] and Suicide Prevention discussed: Yes,  with patient son.  Have you used any form of tobacco in the last 30 days? (Cigarettes, Smokeless Tobacco, Cigars, and/or Pipes): No  Has patient been referred to the Quitline?: N/A patient is not a smoker  Patient has been referred for addiction treatment: Yes  Aaliyah Gavel  E Saddie Sandeen, LCSW 01/09/2021, 3:16 PM

## 2021-01-09 NOTE — Tx Team (Addendum)
Interdisciplinary Treatment and Diagnostic Plan Update  01/09/2021 Time of Session: 9:30am Maureen Swanson MRN: [id number]  Principal Diagnosis: Schizoaffective disorder, bipolar type [facility])  Secondary Diagnoses: Principal Problem:   Schizoaffective disorder, bipolar type ([place]) Active Problems:   Cocaine abuse ([place])   Current Medications:  Current Facility-Administered Medications  Medication Dose Route Frequency Provider Last Rate Last Admin   acetaminophen (TYLENOL) tablet 650 mg  650 mg Oral Q6H PRN Chalmers Guest, NP       albuterol (VENTOLIN HFA) 108 (90 Base) MCG/ACT inhaler 1 puff  1 puff Inhalation Q4H PRN Nwoko, Herbert Pun I, NP       alum & mag hydroxide-simeth (MAALOX/MYLANTA) 200-200-20 MG/5ML suspension 30 mL  30 mL Oral Q4H PRN Chalmers Guest, NP       amLODipine (NORVASC) tablet 10 mg  10 mg Oral Daily Kyle, Tyrone A, DO   10 mg at 01/09/21 0742   atorvastatin (LIPITOR) tablet 40 mg  40 mg Oral QHS Chalmers Guest, NP   40 mg at 01/08/21 2147   benztropine (COGENTIN) tablet 1 mg  1 mg Oral QHS Nelda Marseille, Amy E, MD   1 mg at 01/08/21 2147   gabapentin (NEURONTIN) capsule 300 mg  300 mg Oral TID Harlow Asa, MD   300 mg at 01/09/21 0743   hydrALAZINE (APRESOLINE) tablet 50 mg  50 mg Oral Q8H Kyle, Tyrone A, DO   50 mg at 01/09/21 0640   hydrochlorothiazide (HYDRODIURIL) tablet 25 mg  25 mg Oral Daily Wendee Beavers T, MD   25 mg at 01/09/21 0743   hydrOXYzine (ATARAX/VISTARIL) tablet 25 mg  25 mg Oral Q6H PRN Harlow Asa, MD   25 mg at 01/08/21 2149   loperamide (IMODIUM) capsule 2-4 mg  2-4 mg Oral PRN Harlow Asa, MD       LORazepam (ATIVAN) tablet 1 mg  1 mg Oral Q6H PRN Nelda Marseille, Amy E, MD       losartan (COZAAR) tablet 50 mg  50 mg Oral Daily Wendee Beavers T, MD   50 mg at 01/09/21 0743   magnesium hydroxide (MILK OF MAGNESIA) suspension 30 mL  30 mL Oral Daily PRN Chalmers Guest, NP       melatonin tablet 3 mg  3 mg Oral QHS Nelda Marseille, Amy E, MD   3 mg at 01/08/21  2147   meloxicam (MOBIC) tablet 15 mg  15 mg Oral Daily Viann Fish E, MD   15 mg at 01/09/21 0743   metFORMIN (GLUCOPHAGE) tablet 1,000 mg  1,000 mg Oral BID WC Viann Fish E, MD   1,000 mg at 01/09/21 1751   multivitamin with minerals tablet 1 tablet  1 tablet Oral Daily Viann Fish E, MD   1 tablet at 01/09/21 0744   ondansetron (ZOFRAN-ODT) disintegrating tablet 4 mg  4 mg Oral Q6H PRN Viann Fish E, MD   4 mg at 01/09/21 1003   pantoprazole (PROTONIX) EC tablet 40 mg  40 mg Oral Daily Chalmers Guest, NP   40 mg at 01/09/21 0744   thiamine tablet 100 mg  100 mg Oral Daily Nelda Marseille, Amy E, MD   100 mg at 01/09/21 0744   traZODone (DESYREL) tablet 200 mg  200 mg Oral QHS PRN Corky Sox, MD   200 mg at 01/08/21 2147   valbenazine (INGREZZA) capsule 80 mg  80 mg Oral QHS Chalmers Guest, NP   80 mg at 01/08/21 2147   white petrolatum (  VASELINE) gel   Topical PRN Corky Sox, MD       PTA Medications: Medications Prior to Admission  Medication Sig Dispense Refill Last Dose   ABILIFY MAINTENA 400 MG PRSY prefilled syringe Inject 400 mg into the muscle every 30 (thirty) days.      Accu-Chek Softclix Lancets lancets Check blood glucose twice daily before meals 100 each 12    amLODipine (NORVASC) 5 MG tablet Take 5 mg by mouth daily.      atenolol (TENORMIN) 100 MG tablet Take 100 mg by mouth daily.      atorvastatin (LIPITOR) 40 MG tablet TAKE (1) TABLET BY MOUTH DAILY WITH SUPPER. 28 tablet 2    benztropine (COGENTIN) 1 MG tablet Take by mouth daily.       Blood Glucose Monitoring Suppl (ACCU-CHEK AVIVA PLUS) w/Device KIT Check blood glucose twice daily before meals 1 kit 0    cyclobenzaprine (FLEXERIL) 10 MG tablet Take 10 mg by mouth daily as needed for muscle spasms.      diclofenac Sodium (VOLTAREN) 1 % GEL Apply 1 application topically 4 (four) times daily as needed for pain.      fexofenadine (ALLEGRA) 180 MG tablet TAKE 1 TABLET BY MOUTH ONCE DAILY. (Patient taking  differently: Take 180 mg by mouth daily.) 28 tablet 2    gabapentin (NEURONTIN) 300 MG capsule TAKE (1) CAPSULE BY MOUTH THREE TIMES DAILY (Patient taking differently: Take 300 mg by mouth 3 (three) times daily.) 84 capsule 2    glucose blood (ACCU-CHEK AVIVA PLUS) test strip Check blood glucose twice daily before meals 100 each 12    hydrochlorothiazide (HYDRODIURIL) 12.5 MG tablet TAKE 1 TABLET BY MOUTH ONCE DAILY. (Patient taking differently: Take 12.5 mg by mouth daily.) 28 tablet 2    HYDROcodone-acetaminophen (NORCO) 5-325 MG tablet Take 1 tablet by mouth every 8 (eight) hours. (Patient not taking: No sig reported) 5 tablet 0    ibuprofen (ADVIL) 600 MG tablet Take 600 mg by mouth 3 (three) times daily.      INGREZZA 80 MG CAPS Take 80 mg by mouth at bedtime.      LAMICTAL 100 MG tablet Take 100 mg by mouth daily.      Melatonin 3 MG CAPS Take 3 mg by mouth at bedtime as needed (sleep).      meloxicam (MOBIC) 15 MG tablet TAKE 1 TABLET BY MOUTH ONCE DAILY WITH A MEAL. (Patient taking differently: Take 15 mg by mouth daily.) 28 tablet 2    metFORMIN (GLUCOPHAGE) 1000 MG tablet TAKE 1 TABLET BY MOUTH TWICE DAILY WITH A MEAL. (Patient taking differently: Take 1,000 mg by mouth 2 (two) times daily with a meal.) 56 tablet 2    metoCLOPramide (REGLAN) 10 MG tablet Take 10 mg by mouth daily as needed for nausea.      mometasone (NASONEX) 50 MCG/ACT nasal spray 2 sprays each nostril daily (Patient taking differently: Place 2 sprays into the nose daily as needed (congestion).) 17 g 12    omeprazole (PRILOSEC) 20 MG capsule Take 20 mg by mouth daily.      ondansetron (ZOFRAN) 4 MG tablet Take 1 tablet (4 mg total) by mouth every 8 (eight) hours as needed. (Patient not taking: No sig reported) 20 tablet 0    prazosin (MINIPRESS) 1 MG capsule Take 1 mg by mouth at bedtime.      traZODone (DESYREL) 150 MG tablet Take 150-300 mg by mouth at bedtime as needed for sleep.  Vitamins/Minerals TABS Take 1  tablet by mouth daily.       Patient Stressors: Health problems Loss of boyfriend Occupational concerns Substance abuse  Patient Strengths: Ability for insight Capable of independent living Motivation for treatment/growth Supportive family/friends  Treatment Modalities: Medication Management, Group therapy, Case management,  1 to 1 session with clinician, Psychoeducation, Recreational therapy.   Physician Treatment Plan for Primary Diagnosis: Schizoaffective disorder, bipolar type ([place]) Long Term Goal(s): Improvement in symptoms so as ready for discharge   Short Term Goals: Ability to identify changes in lifestyle to reduce recurrence of condition will improve Ability to demonstrate self-control will improve Ability to identify and develop effective coping behaviors will improve Ability to maintain clinical measurements within normal limits will improve Compliance with prescribed medications will improve  Medication Management: Evaluate patient's response, side effects, and tolerance of medication regimen.  Therapeutic Interventions: 1 to 1 sessions, Unit Group sessions and Medication administration.  Evaluation of Outcomes: Progressing  Physician Treatment Plan for Secondary Diagnosis: Principal Problem:   Schizoaffective disorder, bipolar type ([place]) Active Problems:   Cocaine abuse ([place])  Long Term Goal(s): Improvement in symptoms so as ready for discharge   Short Term Goals: Ability to identify changes in lifestyle to reduce recurrence of condition will improve Ability to demonstrate self-control will improve Ability to identify and develop effective coping behaviors will improve Ability to maintain clinical measurements within normal limits will improve Compliance with prescribed medications will improve     Medication Management: Evaluate patient's response, side effects, and tolerance of medication regimen.  Therapeutic Interventions: 1 to 1 sessions, Unit Group  sessions and Medication administration.  Evaluation of Outcomes: Progressing   RN Treatment Plan for Primary Diagnosis: Schizoaffective disorder, bipolar type ([place]) Long Term Goal(s): Knowledge of disease and therapeutic regimen to maintain health will improve  Short Term Goals: Ability to demonstrate self-control, Ability to participate in decision making will improve, and Ability to verbalize feelings will improve  Medication Management: RN will administer medications as ordered by provider, will assess and evaluate patient's response and provide education to patient for prescribed medication. RN will report any adverse and/or side effects to prescribing provider.  Therapeutic Interventions: 1 on 1 counseling sessions, Psychoeducation, Medication administration, Evaluate responses to treatment, Monitor vital signs and CBGs as ordered, Perform/monitor CIWA, COWS, AIMS and Fall Risk screenings as ordered, Perform wound care treatments as ordered.  Evaluation of Outcomes: Progressing   LCSW Treatment Plan for Primary Diagnosis: Schizoaffective disorder, bipolar type ([place]) Long Term Goal(s): Safe transition to appropriate next level of care at discharge, Engage patient in therapeutic group addressing interpersonal concerns.  Short Term Goals: Engage patient in aftercare planning with referrals and resources, Increase ability to appropriately verbalize feelings, and Increase emotional regulation  Therapeutic Interventions: Assess for all discharge needs, 1 to 1 time with Social worker, Explore available resources and support systems, Assess for adequacy in community support network, Educate family and significant other(s) on suicide prevention, Complete Psychosocial Assessment, Interpersonal group therapy.  Evaluation of Outcomes: Progressing   Progress in Treatment: Attending groups: No. Participating in groups: No. Taking medication as prescribed: Yes. Toleration medication:  Yes. Family/Significant other contact made: No, will contact:  son or sister Patient understands diagnosis: No. Discussing patient identified problems/goals with staff: Yes. Medical problems stabilized or resolved: Yes. Denies suicidal/homicidal ideation: Yes. Issues/concerns per patient self-inventory: No. Other: None  New problem(s) identified: No, Describe:  None  New Short Term/Long Term Goal(s):Patient recently admitted. CSW will continue to follow and  assess for appropriate referrals and possible discharge planning.   Patient Goals:  "to get better, not so depressed and take medications."  Discharge Plan or Barriers: [facility] and follow up with [facility]   Reason for Continuation of Hospitalization: Medication stabilization  Estimated Length of Stay: 3-5 days  Attendees: Patient: Maureen Swanson 01/09/2021   Physician: Fatima Sanger, DO 01/09/2021   Nursing:  01/09/2021   RN Care Manager: 01/09/2021   Social Worker: Verdis Frederickson, [place] 01/09/2021   Recreational Therapist:  01/09/2021   Other:  01/09/2021   Other:  01/09/2021   Other: 01/09/2021     Scribe for Treatment Team: Darleen Crocker, [place] 01/09/2021 11:34 AM

## 2021-01-09 NOTE — Progress Notes (Signed)
Recreation Therapy Notes  Date: 7.27.22 Time: 0930 Location: [address] Dayroom  Group Topic: Stress Management   Goal Area(s) Addresses:  Patient will actively participate in stress management techniques presented during session.  Patient will successfully identify benefit of practicing stress management post d/c.   Intervention: Guided exercise with ambient sound and script  Activity :Guided Imagery  LRT provided education, instruction, and demonstration on practice of visualization via guided imagery. Patients was asked to participate in the technique introduced during session. Patients were given suggestions of ways to access scripts post d/c and encouraged to explore Youtube and other apps available on smartphones, tablets, and computers.   Education:  Stress Management, Discharge Planning.   Education Outcome: Acknowledges education  Clinical Observations/Feedback: Patient did not attend group session.     Victorino Sparrow, LRT/CTRS         Victorino Sparrow A 01/09/2021 12:11 PM

## 2021-01-09 NOTE — Progress Notes (Addendum)
Pt is alert and oriented to person, place, time and situation. Pt is calm, cooperative, denies suicidal and homicidal ideation, denies hallucinations, denies anxiety, reports feelings of depression, but reports it has improved since admission. Pt isolates in her room often. PT is medication complaint. Will continue to monitor pt per Q15 minute face checks and monitor for safety and progress. Pt is focused on discharge. Pt voices no complaints, no distress noted, none reported.

## 2021-01-09 NOTE — Progress Notes (Signed)
The patient attended approximately 10-15 minutes of group and then returned to her room.

## 2021-01-09 NOTE — Progress Notes (Addendum)
[facility] MD Progress Note  01/09/2021 10:52 AM MARCIANNE SWANICK  MRN:  [id number] Subjective:   Maureen Swanson is a 58 YOF with a PPHx of Schizoaffective disorder, bipolar type, as well as MDD, presenting voluntarily after a suicide attempt via overdose.   24 hr events: no documented behavioral issues, no PRN medications given for agitation.  On interview this morning, patient reports improved GI symptoms. She reports a bit of ABD pain but denies N/V/diarrhea. She exhibits more spontaneous speech and she has improved eye contact. She reports that her mood is "good" and she is eager to leave the hospital as soon as possible. She asks why her heart rate is not good enough for her to leave (a level of insight previously not demonstrated). Her thought process is linear and concrete. She denies SI and AVH.     Principal Problem: Schizoaffective disorder, bipolar type ([place]) Diagnosis: Principal Problem:   Schizoaffective disorder, bipolar type ([place]) Active Problems:   Cocaine abuse ([place])  Total Time Spent in Direct Patient Care: I personally spent 30 minutes on the unit in direct patient care. The direct patient care time included face-to-face time with the patient, reviewing the patient's chart, communicating with other professionals, and coordinating care. Greater than 50% of this time was spent in counseling or coordinating care with the patient regarding goals of hospitalization, psycho-education, and discharge planning needs.   Past Psychiatric History: Schizoaffective disorder, bipolar type, depression  Past Medical History:  Past Medical History:  Diagnosis Date   Anxiety    Arthritis    back and knees   Asthma    daily and prn inhalers   Atypical ductal hyperplasia of breast 03/2012   right   Bipolar 1 disorder (HCC)    CHF (congestive heart failure) (HCC)    Depression    Diabetes mellitus    diet-controlled   Gastric ulcer    GERD (gastroesophageal reflux disease)    Gout     Headache(784.0)    migraines   High cholesterol    Hypertension    under control, has been on med. x 12 yrs.   Substance abuse ([place])    crack cocaine:  extensive treatment with [facility] from 2014 to 2018 for this and alcohol abuse.   TMJ (temporomandibular joint disorder)     Past Surgical History:  Procedure Laterality Date   BREAST LUMPECTOMY WITH NEEDLE LOCALIZATION  04/19/2012   Procedure: BREAST LUMPECTOMY WITH NEEDLE LOCALIZATION;  Surgeon: [name], MD;  Location: [facility];  Service: General;  Laterality: Right;   KNEE ARTHROSCOPY W/ PARTIAL MEDIAL MENISCECTOMY  05/01/2010   right   Family History:  Family History  Problem Relation Age of Onset   Diabetes Mother    Breast cancer Mother    Cancer Father    Bipolar disorder Maternal Aunt    Schizophrenia Maternal Grandfather    Alcoholism Maternal Uncle    Family Psychiatric  History: See H and P Social History: previously reported by patient, different than current assessment. On that assessment patient admitted to more significant drug use. Social History   Substance and Sexual Activity  Alcohol Use Yes   Comment: 3x 40 oz beers/day     Social History   Substance and Sexual Activity  Drug Use Yes   Frequency: 5.0 times per week   Types: Cocaine, "Crack" cocaine    Social History   Socioeconomic History   Marital status: Single  Spouse name: Not on file   Number of children: Not on file   Years of education: Not on file   Highest education level: Not on file  Occupational History   Not on file  Tobacco Use   Smoking status: Former    Packs/day: 1.00    Years: 0.00    Pack years: 0.00    Types: Cigarettes   Smokeless tobacco: Never  Vaping Use   Vaping Use: Never used  Substance and Sexual Activity   Alcohol use: Yes    Comment: 3x 40 oz beers/day   Drug use: Yes    Frequency: 5.0 times per week    Types: Cocaine, "Crack" cocaine   Sexual  activity: Not Currently    Birth control/protection: Post-menopausal  Other Topics Concern   Not on file  Social History Narrative   Not on file   Social Determinants of Health   Financial Resource Strain: Not on file  Food Insecurity: Not on file  Transportation Needs: Not on file  Physical Activity: Not on file  Stress: Not on file  Social Connections: Not on file   Additional Social History:    She reports doing illegal drugs (mainly cocaine) 2x per month and drinking one "40 oz" per day.   Sleep: fair  Appetite:  Good  Current Medications: Current Facility-Administered Medications  Medication Dose Route Frequency Provider Last Rate Last Admin   acetaminophen (TYLENOL) tablet 650 mg  650 mg Oral Q6H PRN Chalmers Guest, NP       albuterol (VENTOLIN HFA) 108 (90 Base) MCG/ACT inhaler 1 puff  1 puff Inhalation Q4H PRN Nwoko, Herbert Pun I, NP       alum & mag hydroxide-simeth (MAALOX/MYLANTA) 200-200-20 MG/5ML suspension 30 mL  30 mL Oral Q4H PRN Chalmers Guest, NP       amLODipine (NORVASC) tablet 10 mg  10 mg Oral Daily Kyle, Tyrone A, DO   10 mg at 01/09/21 0742   atorvastatin (LIPITOR) tablet 40 mg  40 mg Oral QHS Chalmers Guest, NP   40 mg at 01/08/21 2147   benztropine (COGENTIN) tablet 1 mg  1 mg Oral QHS Nelda Marseille, Amy E, MD   1 mg at 01/08/21 2147   gabapentin (NEURONTIN) capsule 300 mg  300 mg Oral TID Harlow Asa, MD   300 mg at 01/09/21 0743   hydrALAZINE (APRESOLINE) tablet 50 mg  50 mg Oral Q8H Kyle, Tyrone A, DO   50 mg at 01/09/21 0640   hydrochlorothiazide (HYDRODIURIL) tablet 25 mg  25 mg Oral Daily Wendee Beavers T, MD   25 mg at 01/09/21 0743   hydrOXYzine (ATARAX/VISTARIL) tablet 25 mg  25 mg Oral Q6H PRN Harlow Asa, MD   25 mg at 01/08/21 2149   loperamide (IMODIUM) capsule 2-4 mg  2-4 mg Oral PRN Harlow Asa, MD       LORazepam (ATIVAN) tablet 1 mg  1 mg Oral Q6H PRN Harlow Asa, MD       losartan (COZAAR) tablet 50 mg  50 mg Oral Daily  Wendee Beavers T, MD   50 mg at 01/09/21 0743   magnesium hydroxide (MILK OF MAGNESIA) suspension 30 mL  30 mL Oral Daily PRN Chalmers Guest, NP       melatonin tablet 3 mg  3 mg Oral QHS Nelda Marseille, Amy E, MD   3 mg at 01/08/21 2147   meloxicam (MOBIC) tablet 15 mg  15 mg Oral Daily Nelda Marseille,  Amy E, MD   15 mg at 01/09/21 0743   metFORMIN (GLUCOPHAGE) tablet 1,000 mg  1,000 mg Oral BID WC Nelda Marseille, Amy E, MD   1,000 mg at 01/09/21 [id number]   multivitamin with minerals tablet 1 tablet  1 tablet Oral Daily Harlow Asa, MD   1 tablet at 01/09/21 0744   ondansetron (ZOFRAN-ODT) disintegrating tablet 4 mg  4 mg Oral Q6H PRN Harlow Asa, MD   4 mg at 01/09/21 1003   pantoprazole (PROTONIX) EC tablet 40 mg  40 mg Oral Daily Chalmers Guest, NP   40 mg at 01/09/21 0744   thiamine tablet 100 mg  100 mg Oral Daily Nelda Marseille, Amy E, MD   100 mg at 01/09/21 0744   traZODone (DESYREL) tablet 200 mg  200 mg Oral QHS PRN Corky Sox, MD   200 mg at 01/08/21 2147   valbenazine (INGREZZA) capsule 80 mg  80 mg Oral QHS Chalmers Guest, NP   80 mg at 01/08/21 2147   white petrolatum (VASELINE) gel   Topical PRN Corky Sox, MD        Lab Results:  Results for orders placed or performed during the hospital encounter of 01/02/21 (from the past 48 hour(s))  Glucose, capillary     Status: Abnormal   Collection Time: 01/07/21 11:59 AM  Result Value Ref Range   Glucose-Capillary 107 (H) 70 - 99 mg/dL    Comment: Glucose reference range applies only to samples taken after fasting for at least 8 hours.  Glucose, capillary     Status: Abnormal   Collection Time: 01/07/21  5:05 PM  Result Value Ref Range   Glucose-Capillary 115 (H) 70 - 99 mg/dL    Comment: Glucose reference range applies only to samples taken after fasting for at least 8 hours.  Magnesium     Status: None   Collection Time: 01/07/21  6:21 PM  Result Value Ref Range   Magnesium 1.8 1.7 - 2.4 mg/dL    Comment: Performed at [facility], [place] [address], [place], [place] [address]  Glucose, capillary     Status: None   Collection Time: 01/08/21  5:51 AM  Result Value Ref Range   Glucose-Capillary 90 70 - 99 mg/dL    Comment: Glucose reference range applies only to samples taken after fasting for at least 8 hours.   Comment 1 Notify RN    Comment 2 Document in Chart   Glucose, capillary     Status: None   Collection Time: 01/08/21 11:22 AM  Result Value Ref Range   Glucose-Capillary 89 70 - 99 mg/dL    Comment: Glucose reference range applies only to samples taken after fasting for at least 8 hours.  Glucose, capillary     Status: Abnormal   Collection Time: 01/08/21  5:07 PM  Result Value Ref Range   Glucose-Capillary 111 (H) 70 - 99 mg/dL    Comment: Glucose reference range applies only to samples taken after fasting for at least 8 hours.  Glucose, capillary     Status: Abnormal   Collection Time: 01/08/21  7:48 PM  Result Value Ref Range   Glucose-Capillary 119 (H) 70 - 99 mg/dL    Comment: Glucose reference range applies only to samples taken after fasting for at least 8 hours.   Comment 1 Notify RN    Comment 2 Document in Chart   Glucose, capillary     Status: Abnormal   Collection  Time: 01/09/21  5:56 AM  Result Value Ref Range   Glucose-Capillary 105 (H) 70 - 99 mg/dL    Comment: Glucose reference range applies only to samples taken after fasting for at least 8 hours.    Blood Alcohol level:  Lab Results  Component Value Date   ETH 11 (H) 01/01/2021   ETH <5 [id number]    Metabolic Disorder Labs: Lab Results  Component Value Date   HGBA1C 5.6 01/03/2021   MPG 114.02 01/03/2021   MPG 100 11/20/2015   Lab Results  Component Value Date   PROLACTIN 5.7 08/10/2015   PROLACTIN 141.3 (H) 07/21/2015   Lab Results  Component Value Date   CHOL 211 (H) 01/03/2021   TRIG 109 01/03/2021   HDL 56 01/03/2021   CHOLHDL 3.8 01/03/2021   VLDL 22 01/03/2021   LDLCALC 133 (H)  01/03/2021   LDLCALC 135 (H) 11/23/2019    Physical Findings: AIMS:NA CIWA:  CIWA-Ar Total: 0 COWS:   NA  Musculoskeletal: Strength & Muscle Tone: nml Gait & Station: normal Patient leans: N/A  Psychiatric Specialty Exam:  Presentation  General Appearance: Casual  Eye Contact:Fair  Speech:mumbling quality but normal fluency   Speech Volume:Normal  Handedness:Right   Mood and Affect  Mood:Euthymic  Affect:constricted   Thought Process  Thought Processes:Coherent; Linear; Goal Directed , superficially goal directed  Descriptions of Associations:Intact  Orientation:Full (Time, Place and Person)  Thought Content: denies AVH currently, no evidence of response to internal stimuli or paranoia on exam  History of Schizophrenia/Schizoaffective disorder:Yes  Duration of Psychotic Symptoms:Greater than six months  Hallucinations: Denied today  Ideas of Reference:None  Suicidal Thoughts:Denied Homicidal Thoughts:Homicidal Thoughts: No   Sensorium  Memory:Immediate Fair; Recent Fair; Remote Fair  Judgment: fair, taking prescribed medication, amenable to treatment plans Insight: fair, patient demonstrates and ability to reason about diagnoses  Executive Functions  Concentration:Fair  Attention Span:Fair  [place]   Psychomotor Activity  Psychomotor Activity:Psychomotor Activity: Normal   Assets  Assets:-- (NA)   Sleep  Sleep:Sleep: Fair Number of Hours of Sleep: 6.75   Physical Exam Vitals reviewed.  Constitutional:      Appearance: Normal appearance.  HENT:     Head: Normocephalic and atraumatic.  Pulmonary:     Effort: Pulmonary effort is normal.  Neurological:     General: No focal deficit present.     Mental Status: She is alert and oriented to person, place, and time.  ABD exam: mildly TTP diffusely, non-rigid with no guarding  Review of Systems  Respiratory:  Negative for shortness  of breath.   Cardiovascular:  Negative for chest pain.  Neurological:  Negative for dizziness and headaches.  Blood pressure 137/83, pulse 60, temperature 98.5 F (36.9 C), temperature source Oral, resp. rate 18, height '5\' 5"'$  (1.651 m), weight 97.1 kg, SpO2 97 %. Body mass index is 35.62 kg/m.  Treatment plan Maureen Swanson is a 51 YOF with a PPHx of Schizoaffective disorder, bipolar type, as well as MDD, presenting voluntarily after a suicide attempt via overdose.   Safety and Monitoring -- VOLUNTARY admission to inpatient psychiatric unit for safety, stabilization and treatment -- Daily contact with patient to assess and evaluate symptoms and progress in treatment -- Patient's case to be discussed in multi-disciplinary team meeting -- Observation Level : q15 minute checks -- Vital signs:  q12 hours -- Precautions: suicide   Schizoaffective d/o bipolar type by hx -Abilify maintena 400 mg IM given June 30th, next  due July 29th -Should be able to re-administer before D/C Antipsychotic labs             EKG: NSR, Qtc 440 with LVH             Lipids: Chol 211, LDL 133             A1C: 5.6 AIMS: 0, no rigidity or cogwheeling -Continue Ingrezza 80 mg daily for previous TD -Prozac 10 mg - D/C'd given GI side effects -Continue trazodone to 200 mg for sleep and  Melatonin '3mg'$  po qhs -Continue Congentin '1mg'$  qhs - continue Prazosin 1 mg for nightmares - D/C'd given bradycardia -Continue Neurontin '300mg'$  tid   Stimulant Use d/o - cocaine type Alcohol Use Disorder -CIWA: 1 mg Ativan q6hr PRN for score >10, patient currently scoring 0 -multivitamin and thiamine oral replacement - counseled on need to abstain from illicit substances and patient okay to go to [facility] CBC: with mild anemia of 11.8 CMP: unremarkable EtOH of 11 mg/dL UDS with cocaine CBG <150 TSH 0.87  Bradycardia and HTN EKG 7/24 showing sinus brady, normal PR interval Medicine consultant recs:  -D/C  atenolol given bradycardia and rechecking vitals q4 hours while awake.  Repeat EKG 7/25 sinus brady, w/out evidence of heart block -Continue HCTZ at 12.5 mg -Continue Amlodipine 10 mg, continue Hydralazine 50 mg q8 hrs -Checked ambulatory HR, 54->58 after walking briefly HR improving, from 40's to 50's-60's, BP improving - Renal function panel WNL and Mag+ WNL - CTM HR and BP with q4 vitals  Type 2 DM FSBS qAC and daily in the AM, acceptable so far Continue metformin 1,000 mg bid, possible contributor to GI problems as patient was non-compliant as an outpatient  Miscellaneous -Continue albuterol for asthma, lipitor for HLD, Protonix for GERD, Vaseline for dry lips and legs   Continue PRN's: Tylenol, Maalox, Atarax, Milk of Magnesia, imodium, and Zofran   Dispo: Engineer, materials, early AM  Corky Sox PGY-1, Psychiatry

## 2021-01-09 NOTE — Discharge Summary (Signed)
Physician Discharge Summary Note  Patient:  Maureen Swanson is an 55 y.o., female MRN:  [id number] DOB:  17-Sep-1965 Patient phone:  [phone] (home)  Patient address:   [place] [address],  Total Time spent with patient: 30 minutes  Date of Admission:  01/02/2021 Date of Discharge: 01/10/2021  Reason for Admission:  (From MD's admission note): History of Present Illness:    Per chart review and patient reports, patient came to [facility] voluntarily on 7/19 reporting a suicide attempt by ingesting 10 trazodone (300 mg tablets) and 1 ibuprofen 600 mg tablet. She reported worsening depression and SI since her boyfriend of 13 years died 5 days ago. She also reported AVH at that time. Patient is currently homeless.   Per chart review, patient was last admitted to [facility] for 12 days in 2017 for a similar presentation. At that time her boyfriend removed her from his house and she developed depression with SI. She had the Dx of Schizoaffective d/o at that time and was D/C'd on Abilify Maintena 400 mg, perphenazine 4 mg AM / 8 mg PM, Depakote 500 mg, and Gabapentin 400 mg. Patient does not remember this hospitalization but volunteers that she was hospitalized in 2005 for cutting herself intentionally in the abdomen.   On interview th patient reports her mood as "depressed" and reports AVH. With regard to [facility], she reports seeing elephants and horses as well as people with knives. She reports command AH, most recently 2 days ago, which tell her to harm herself. She reports first experiencing these symptoms when her mom died in 46. In terms of depressive symptomatology, patient reports lack of interest, guilt, and decreased concentration. Patient reports not wanting to be alive and says that her plan is to cut again or OD if she leaves the hospital.   She reports doing illegal drugs (mainly cocaine) 2x per month and drinking one "40 oz" per day.   Patient has an ACTT team, which was contacted. Spoke  with Dr. Eduard Clos  who reports patient frequently presents to emergency rooms and hospitals with complaints of SI when she loses her housing. He reports that she has been non-compliant with oral medication previously, which is why she has been receiving Abilify Maintena for the past 3 years. She last received 400 mg on June 30th, due for her next injection on July 29th. He reports that her medication regimen consists of Prazosin 1 mg, Trazodone 150 mg, and Ingrezza 80 mg. She has tried oral medications including Zyprexa, Seroquel, Prozac, Zoloft, and Cymbalta, but he feels none of these have been adequate trials due to non-compliance.    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 x 12 yrs.  Substance abuse ([place])    crack cocaine:  extensive treatment with [facility] from 2014 to 2018 for this and alcohol abuse.   TMJ (temporomandibular joint disorder)     Past Surgical History:  Procedure Laterality Date   BREAST LUMPECTOMY WITH NEEDLE LOCALIZATION  04/19/2012   Procedure: BREAST LUMPECTOMY WITH NEEDLE LOCALIZATION;  Surgeon: [name], MD;  Location: [facility];  Service: General;  Laterality: Right;   KNEE ARTHROSCOPY W/ PARTIAL MEDIAL MENISCECTOMY  05/01/2010   right    Family History:  Family History  Problem Relation Age of Onset   Diabetes Mother    Breast cancer Mother    Cancer Father    Bipolar disorder Maternal Aunt    Schizophrenia Maternal Grandfather    Alcoholism Maternal Uncle    Family Psychiatric  History: See H&P Social History:  Social History   Substance and Sexual Activity  Alcohol Use Yes   Comment: 3x 40 oz beers/day     Social History   Substance and Sexual Activity  Drug Use Yes   Frequency: 5.0 times per week   Types: Cocaine, "Crack" cocaine    Social History   Socioeconomic History   Marital status: Single    Spouse name: Not on file   Number of children: Not on file   Years of education: Not on file   Highest education level: Not on file  Occupational History   Not on file  Tobacco Use   Smoking status: Former    Packs/day: 1.00    Years: 0.00    Pack years: 0.00    Types: Cigarettes   Smokeless tobacco: Never  Vaping Use   Vaping Use: Never used  Substance and Sexual Activity   Alcohol use: Yes    Comment: 3x 40 oz beers/day   Drug use: Yes    Frequency: 5.0 times per week    Types: Cocaine, "Crack" cocaine   Sexual activity: Not Currently    Birth control/protection: Post-menopausal  Other Topics Concern   Not on file  Social History Narrative   Not on file   Social Determinants of Health   Financial Resource Strain: Not on file  Food Insecurity: Not on file  Transportation Needs: Not on file  Physical Activity: Not on file  Stress: Not on file  Social Connections: Not on file    Hospital Course:  After the above admission evaluation, Ottilia's presenting symptoms were noted. She was recommended for mood stabilization treatments. The medication regimen targeting those presenting symptoms were discussed with her & initiated with her consent. She was restarted on home medications, Ingrezza, Cogentin, Metformin, Lipitor, Mobic, and HCTZ, which was increased from 12.5 mg to 25 mg daily. She  was started on Hydralazine 50 mg daily for hypertension. Her blood pressure had been running high (150's/110's),  internal medicine was consulted and gave their recommendations to start Hydralazine and increase HCTZ. Patient vital signs are stable with BP 140/80.  Her Abilify Maintena 400 mg IM LAI was given on 01/10/2021. Her last injection was given on June 30th. She receives services through an ACTT team and the injection date was verified with them. Her UDS on arrival to the ED was positive for cocaine,  her BAL was 11, however, she did not develop any alcohol withdrawal symptoms & did not receive alcohol detoxification treatments. She was medicated, stabilized & discharged on the medications as listed on her discharge medication list below. She has  been provided with 30 days worth of her daily medications to take to [facility] for substance abuse treatment. Besides the mood stabilization treatments, Artrice was also enrolled & participated in the group counseling sessions being offered & held on this unit. She learned coping skills. She presented no other significant pre-existing medical issues that required treatment. She tolerated his treatment regimen without any adverse effects or reactions reported.   During the course of her hospitalization, the 15-minute checks were adequate to ensure patient's safety. Valaree did not display any dangerous, violent or suicidal behavior on the unit. She interacted with patients & staff appropriately, participated appropriately in the group sessions/therapies. Her medications were addressed & adjusted to meet her needs. She was recommended for outpatient follow-up care & medication management upon discharge to assure continuity of care & mood stability.  At the time of discharge patient is not reporting any acute suicidal/homicidal ideations. She feels more confident about her self-care & in managing his mental health. She currently denies any new issues or concerns.  Education and supportive counseling provided throughout her hospital stay & upon discharge.   Today upon her discharge evaluation with the attending psychiatrist, Kortney shares she is doing better. She apparently is having some second thoughts about going to [facility] and stated she had a friend to stay with. When informed she would need to let us safety plan with the friend she then stated, "I will go on and go to [facility] tomorrow." She denies any other specific concerns. She is sleeping well. Her appetite is good. She denies other physical complaints. She denies AH/VH, delusional thoughts or paranoia. She does not appear to be responding to any internal stimuli. She feels that her medications have been helpful & is in agreement to continue his/her current treatment regimen as recommended. She was able to engage in safety planning including plan to return to [facility] or contact emergency services if she feels unable to maintain his/her own safety or the safety of others. Pt had no further questions, comments, or concerns. She left [facility] with all personal belongings in no apparent distress. Transportation to [facility] via [employer].    Physical Findings: AIMS: Facial and Oral Movements Muscles of Facial Expression: None, normal Lips and Perioral Area: None, normal Jaw: None, normal Tongue: None, normal,Extremity Movements Upper (arms, wrists, hands, fingers): None, normal Lower (legs, knees, ankles, toes): None, normal, Trunk Movements Neck, shoulders, hips: None, normal, Overall Severity Severity of abnormal movements (highest score from questions above): None, normal Incapacitation due to abnormal movements: None, normal Patient's awareness of abnormal movements (rate only patient's report): No Awareness, Dental Status Current problems with teeth and/or dentures?: No Does patient usually wear dentures?: No  CIWA:  CIWA-Ar Total: 0 COWS:     Musculoskeletal: Strength & Muscle Tone: within normal  limits Gait & Station: normal Patient leans: N/A   Psychiatric Specialty Exam:  Presentation  General Appearance: Casual  Eye Contact:Good  Speech:-- (clear but garbled at points)  Speech Volume:Normal  Handedness:Right  Mood and Affect  Mood:Euthymic  Affect:Depressed  Thought Process  Thought Processes:Coherent; Linear; Goal Directed  Descriptions of Associations:Intact  Orientation:Full (Time, Place and Person)  Thought Content:Logical  History of Schizophrenia/Schizoaffective disorder:Yes  Duration of Psychotic Symptoms:Greater than six months  Hallucinations:Description of Command Hallucinations: See H and P Description of Auditory Hallucinations: See H and P Description of Visual Hallucinations: See H and P  Ideas of Reference:None  Suicidal Thoughts:Suicidal Thoughts: No  Homicidal Thoughts:Homicidal Thoughts: No  Sensorium  Memory:Immediate Fair; Recent Fair; Remote Fair  Judgment:Poor  Insight:Fair  Executive Functions  Concentration:Fair  Attention Span:Fair  [place]  Psychomotor Activity  Psychomotor Activity:Psychomotor Activity: Normal  Assets  Assets:-- (NA)  Sleep  Sleep:Sleep: Fair Number of Hours of Sleep: 6.75  Physical Exam: Physical Exam Constitutional:      Appearance: Normal appearance.  HENT:     Head: Normocephalic.  Pulmonary:     Effort: Pulmonary effort is normal.  Musculoskeletal:        General: Normal range of motion.     Cervical back: Normal range of motion.  Neurological:     General: No focal deficit present.     Mental Status: She is alert and oriented to person, place, and time.   Review of Systems  Constitutional: Negative.  Negative for fever.  HENT:  Negative for congestion and sinus pain.   Respiratory: Negative.  Negative for cough and shortness of breath.   Cardiovascular: Negative.  Negative for chest pain.  Gastrointestinal: Negative.    Genitourinary: Negative.   Musculoskeletal: Negative.   Neurological: Negative.    Blood pressure 140/80, pulse (!) 55, temperature 98.6 F (37 C), temperature source Oral, resp. rate 18, height $RemoveBe'5\' 5"'BJbbekStk$  (1.651 m), weight 97.1 kg, SpO2 93 %. Body mass index is 35.62 kg/m.   Social History   Tobacco Use  Smoking Status Former   Packs/day: 1.00   Years: 0.00   Pack years: 0.00   Types: Cigarettes  Smokeless Tobacco Never   Tobacco Cessation:  N/A, patient does not currently use tobacco products   Blood Alcohol level:  Lab Results  Component Value Date   ETH 11 (H) 01/01/2021   ETH <5 84/16/6063    Metabolic Disorder Labs:  Lab Results  Component Value Date   HGBA1C 5.6 01/03/2021   MPG 114.02 01/03/2021   MPG 100 11/20/2015   Lab Results  Component Value Date   PROLACTIN 5.7 08/10/2015   PROLACTIN 141.3 (H) 07/21/2015   Lab Results  Component Value Date   CHOL 211 (H) 01/03/2021   TRIG 109 01/03/2021   HDL 56 01/03/2021   CHOLHDL 3.8 01/03/2021   VLDL 22 01/03/2021   LDLCALC 133 (H) 01/03/2021   LDLCALC 135 (H) 11/23/2019    See Psychiatric Specialty Exam and Suicide Risk Assessment completed by Attending Physician prior to discharge.  Discharge destination:  [facility]  Is patient on multiple antipsychotic therapies at discharge:  No   Has Patient had three or more failed trials of antipsychotic monotherapy by history:  No  Recommended Plan for Multiple Antipsychotic Therapies: NA  Discharge Instructions     Diet - low sodium heart healthy   Complete by: As directed    Increase activity slowly   Complete by: As directed       Allergies as of 01/09/2021       Reactions   Chocolate Hives   Orange Hives   "Acid foods"   Penicillins Hives   Has patient had a PCN reaction causing immediate rash, facial/tongue/throat swelling, SOB or lightheadedness with hypotension: Yes Has patient had a PCN reaction causing severe rash involving mucus  membranes or skin necrosis: Yes Has patient had a PCN reaction that required hospitalization No Has patient had a PCN reaction occurring within the last 10 years: No If all of the above answers are "NO", then may proceed with Cephalosporin use.   Chocolate Flavor Hives   Other Swelling   strawberries  Penicillin G    Strawberry Extract Swelling   Tomato Hives   "acid foods"        Medication List     STOP taking these medications    atenolol 100 MG tablet Commonly known as: TENORMIN   cyclobenzaprine 10 MG tablet Commonly known as: FLEXERIL   diclofenac Sodium 1 % Gel Commonly known as: VOLTAREN   fexofenadine 180 MG tablet Commonly known as: ALLEGRA   HYDROcodone-acetaminophen 5-325 MG tablet Commonly known as: Norco   ibuprofen 600 MG tablet Commonly known as: ADVIL   LaMICtal 100 MG tablet Generic drug: lamoTRIgine   Melatonin 3 MG Caps Replaced by: melatonin 3 MG Tabs tablet   metoCLOPramide 10 MG tablet Commonly known as: REGLAN   omeprazole 20 MG capsule Commonly known as: PRILOSEC   ondansetron 4 MG tablet Commonly known as: Zofran   prazosin 1 MG capsule Commonly known as: MINIPRESS   Vitamins/Minerals Tabs       TAKE these medications      Indication  Abilify Maintena 400 MG Prsy prefilled syringe Generic drug: ARIPiprazole ER Inject 400 mg into the muscle every 30 (thirty) days. Last injection given on 01/10/2021. Next injection due 02/07/2021 What changed: additional instructions  Indication: Manic-Depression   Accu-Chek Aviva Plus test strip Generic drug: glucose blood Check blood glucose twice daily before meals  Indication: diabetes   Accu-Chek Aviva Plus w/Device Kit Check blood glucose twice daily before meals  Indication: diabetes   Accu-Chek Softclix Lancets lancets Check blood glucose twice daily before meals  Indication: Diabetes   albuterol 108 (90 Base) MCG/ACT inhaler Commonly known as: VENTOLIN HFA Inhale 1  puff into the lungs every 4 (four) hours as needed for wheezing or shortness of breath.  Indication: Asthma   amLODipine 10 MG tablet Commonly known as: NORVASC Take 1 tablet (10 mg total) by mouth daily. Start taking on: January 10, 2021 What changed:  medication strength how much to take  Indication: High Blood Pressure Disorder   atorvastatin 40 MG tablet Commonly known as: LIPITOR Take 1 tablet (40 mg total) by mouth at bedtime. What changed:  how much to take how to take this when to take this additional instructions  Indication: High Amount of Fats in the Blood   benztropine 1 MG tablet Commonly known as: COGENTIN Take 1 tablet (1 mg total) by mouth at bedtime. What changed:  how much to take when to take this  Indication: Extrapyramidal Reaction caused by Medications   gabapentin 300 MG capsule Commonly known as: NEURONTIN Take 1 capsule (300 mg total) by mouth 3 (three) times daily. What changed: See the new instructions.  Indication: Abuse or Misuse of Alcohol, Alcohol Withdrawal Syndrome   hydrALAZINE 50 MG tablet Commonly known as: APRESOLINE Take 1 tablet (50 mg total) by mouth every 8 (eight) hours.  Indication: High Blood Pressure Disorder   hydrochlorothiazide 25 MG tablet Commonly known as: HYDRODIURIL Take 1 tablet (25 mg total) by mouth daily. Start taking on: January 10, 2021 What changed:  medication strength how much to take  Indication: High Blood Pressure Disorder   Ingrezza 80 MG capsule Generic drug: valbenazine Take 1 capsule (80 mg total) by mouth at bedtime.  Indication: Tardive Dyskinesia   losartan 50 MG tablet Commonly known as: COZAAR Take 1 tablet (50 mg total) by mouth daily. Start taking on: January 10, 2021  Indication: High Blood Pressure Disorder   melatonin 3 MG Tabs tablet Take 1 tablet (3 mg total)  by mouth at bedtime. Replaces: Melatonin 3 MG Caps  Indication: Trouble Sleeping   meloxicam 15 MG tablet Commonly known  as: MOBIC Take 1 tablet (15 mg total) by mouth daily. Start taking on: January 10, 2021 What changed: See the new instructions.  Indication: Joint Damage causing Pain and Loss of Function   metFORMIN 1000 MG tablet Commonly known as: GLUCOPHAGE Take 1 tablet (1,000 mg total) by mouth 2 (two) times daily with a meal.  Indication: Type 2 Diabetes   mometasone 50 MCG/ACT nasal spray Commonly known as: Nasonex 2 sprays each nostril daily What changed:  how much to take how to take this when to take this reasons to take this additional instructions  Indication: Hayfever   pantoprazole 40 MG tablet Commonly known as: PROTONIX Take 1 tablet (40 mg total) by mouth daily. Start taking on: January 10, 2021  Indication: Gastroesophageal Reflux Disease   traZODone 100 MG tablet Commonly known as: DESYREL Take 2 tablets (200 mg total) by mouth at bedtime as needed for sleep. What changed:  medication strength how much to take  Indication: [place]. Call.   Specialty: Hospice and Palliative Medicine Why: Please call this provider personally to schedule an appointment for bereavement/grief counseling services. Contact information: [facility] Follow up.   Specialty: Behavioral Health Why: You may go to this provider for therapy and medication management services during walk in hours:  Monday through Wednesday from 8:00 am to 11:00 am.  Services are provided on a first come, first served basis. Contact information: [place] [phone]        Services, [facility] Follow up.   Why: You may go to this provider for residential substance use treatment services. You have been accepted to [facility] 28 day program. You have an appointment on 01/10/21 at [place] for  admit Contact information: [address] [place] [address] [phone]         [facility] Follow up.   Why: Continue with your ACTT team. Contact information: [place] [address] [phone]                 Follow-up recommendations:  Activity:  as tolerated Diet:  Heart healthy  Comments:  Prescriptions and samples were given at discharge.  Patient is agreeable with the discharge plan. She was given an opportunity to ask questions.  She appears to feel comfortable with discharge and denies any current suicidal or homicidal thoughts.   Patient is instructed prior to discharge to: Take all medications as prescribed by her mental healthcare provider. Report any adverse effects and or reactions from the medicines to her outpatient provider promptly. Patient has been instructed & cautioned: To not engage in alcohol and or illegal drug use while on prescription medicines. In the event of worsening symptoms, patient is instructed to call the crisis hotline, 911 and or go to the nearest ED for appropriate evaluation and treatment of symptoms. To follow-up with her primary care provider for your other medical issues, concerns and or health care needs.   Signed: Ethelene Hal, NP 01/09/2021, 4:01 PM

## 2021-01-10 LAB — GLUCOSE, CAPILLARY: Glucose-Capillary: 88 mg/dL (ref 70–99)

## 2021-01-10 NOTE — Progress Notes (Signed)
Nurs Dischg Note:  D:Patient denies SI/HI/AVH at this time. Pt appears calm and cooperative, and no distress noted. Pt given Abilify Maintena 400 mg monthly shot .  A: All Personal items in locker returned to pt. Pt given AVS/ SRA / [facility]/ Copy prescriptions with samples Pt escorted to the lobby to wait for ride to [facility].   R:  Pt States she will comply with outpatient / other services, and take MEDS as prescribed.

## 2021-01-10 NOTE — Progress Notes (Signed)
Pt stated she was feeling better and ready to D/C to [facility]    01/10/21 0100  Psych Admission Type (Psych Patients Only)  Admission Status Voluntary  Psychosocial Assessment  Patient Complaints None  Eye Contact Fair  Facial Expression Flat  Affect Appropriate to circumstance  Speech Logical/coherent  Interaction Assertive  Motor Activity Slow  Appearance/Hygiene Improved  Behavior Characteristics Cooperative  Mood Anxious;Depressed  Thought Process  Coherency Concrete thinking  Content Blaming others  Delusions None reported or observed  Perception WDL  Hallucination None reported or observed  Judgment WDL  Confusion None  Danger to Self  Current suicidal ideation? Denies  Danger to Others  Danger to Others None reported or observed

## 2021-01-11 ENCOUNTER — Other Ambulatory Visit: Payer: Self-pay | Admitting: Internal Medicine

## 2021-02-07 ENCOUNTER — Other Ambulatory Visit: Payer: Self-pay | Admitting: Internal Medicine

## 2021-02-10 NOTE — Telephone Encounter (Signed)
Please call and see about getting her scheduled for appt. To get meds filled.  Has not been seen in 14 months and was having issues with depression in recent months. Once we have an appt, will refill meds.

## 2021-02-11 NOTE — Telephone Encounter (Signed)
Pt did not answer call. Voicemail left to call back

## 2021-02-15 NOTE — Telephone Encounter (Signed)
Pt scheduled 10/19.

## 2021-03-27 ENCOUNTER — Encounter: Payer: Self-pay | Admitting: Physician Assistant

## 2021-03-27 NOTE — Progress Notes (Signed)
Pt fairly new to the shelter.   She was living with a boyfriend, but left him because he told her what to do all the time.  She was hospitalized for a suicide attempt 07/20-07/28/2022. She got her meds at d/c and has been getting them from the ACT team since then..   However, she has not been taking her meds as prescribed.   She took 8 pills yesterday in the morning, none in the evening and none today. She did take the metformin and amlodipine, and has been getting her Abilify shots. Otherwise, not sure what she is taking.   She ate once today, CBG 129   BP 130/78, HR 87, O2 sats 98%  We found her a pill organizer. She also needs a glucometer. Bonnita Nasuti will obtain.   She needs shoes. We will work on these.   Her L knee has pain, it gives out upon occasion. There is no swelling, no crepitus. Knee Xrays in June 2022 showed some arthritis, not severe. Knee brace given.  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 x 12 yrs.   Substance abuse ([place])    crack cocaine:  extensive treatment with [facility] from 2014 to 2018 for this and alcohol abuse.   TMJ (temporomandibular joint disorder)    Rosaria Ferries, PA-C 03/27/2021 5:04 PM

## 2021-04-03 ENCOUNTER — Ambulatory Visit: Payer: Medicaid Other | Admitting: Internal Medicine

## 2021-04-03 NOTE — Progress Notes (Signed)
Maureen Swanson What shelter is [name] at? Is there any likelihood she will find housing soon?   Please call me at the clinic when you have a chance:  [phone].

## 2021-04-12 NOTE — Progress Notes (Addendum)
She was to have an appt on 10/19 with [place], but could not get a ride.   We have been unable to get her to keep her appts previously--last seen 11/2019 after another year's hiatus Dr. Tomasa Hosteller you seeing folks at [facility]?   If not, could she get established at the [facility]?   I would be happy to send her prescriptions for 6 months to [facility] on [address], which would likely be closest pharmacy for her if she is still with Medicaid?   Rhonda--will try to call you this afternoon for more info. Thanks [employer]

## 2021-04-18 NOTE — Progress Notes (Signed)
Dr Amil Amen.  I see my clinic patients at [facility].  I can see her there to est PCP if you would like.   I also do a medication bridging / health screening clinic at [place] which is where Suanne Marker and I met ms Nori Riis.     We can f/u with Ms Bleecker to see what she prefers.  Thanks   Lyda Jester   cell [phone]

## 2021-05-02 ENCOUNTER — Ambulatory Visit: Payer: Medicaid Other | Admitting: Internal Medicine

## 2021-05-29 ENCOUNTER — Ambulatory Visit (HOSPITAL_COMMUNITY)
Admission: EM | Admit: 2021-05-29 | Discharge: 2021-05-30 | Disposition: A | Discharge status: Home / Self Care | Payer: Medicaid Other | Attending: Family | Admitting: Family

## 2021-05-29 DIAGNOSIS — Z79899 Other long term (current) drug therapy: Secondary | ICD-10-CM | POA: Insufficient documentation

## 2021-05-29 DIAGNOSIS — F250 Schizoaffective disorder, bipolar type: Secondary | ICD-10-CM | POA: Insufficient documentation

## 2021-05-29 DIAGNOSIS — Z5900 Homelessness unspecified: Secondary | ICD-10-CM | POA: Insufficient documentation

## 2021-05-29 DIAGNOSIS — F1994 Other psychoactive substance use, unspecified with psychoactive substance-induced mood disorder: Secondary | ICD-10-CM | POA: Insufficient documentation

## 2021-05-29 DIAGNOSIS — Z20822 Contact with and (suspected) exposure to covid-19: Secondary | ICD-10-CM | POA: Insufficient documentation

## 2021-05-29 LAB — CBC WITH DIFFERENTIAL/PLATELET
Abs Immature Granulocytes: 0.02 10*3/uL (ref 0.00–0.07)
Basophils Absolute: 0 10*3/uL (ref 0.0–0.1)
Basophils Relative: 0 %
Eosinophils Absolute: 0.1 10*3/uL (ref 0.0–0.5)
Eosinophils Relative: 1 %
HCT: 40.9 % (ref 36.0–46.0)
Hemoglobin: 13.1 g/dL (ref 12.0–15.0)
Immature Granulocytes: 0 %
Lymphocytes Relative: 19 %
Lymphs Abs: 1.5 10*3/uL (ref 0.7–4.0)
MCH: 29.4 pg (ref 26.0–34.0)
MCHC: 32 g/dL (ref 30.0–36.0)
MCV: 91.9 fL (ref 80.0–100.0)
Monocytes Absolute: 0.4 10*3/uL (ref 0.1–1.0)
Monocytes Relative: 5 %
Neutro Abs: 5.8 10*3/uL (ref 1.7–7.7)
Neutrophils Relative %: 75 %
Platelets: 199 10*3/uL (ref 150–400)
RBC: 4.45 MIL/uL (ref 3.87–5.11)
RDW: 12 % (ref 11.5–15.5)
WBC: 7.7 10*3/uL (ref 4.0–10.5)
nRBC: 0 % (ref 0.0–0.2)

## 2021-05-29 LAB — POCT URINE DRUG SCREEN - MANUAL ENTRY (I-SCREEN)
POC Amphetamine UR: NOT DETECTED
POC Buprenorphine (BUP): NOT DETECTED
POC Cocaine UR: POSITIVE — AB
POC Marijuana UR: NOT DETECTED
POC Methadone UR: NOT DETECTED
POC Methamphetamine UR: NOT DETECTED
POC Morphine: NOT DETECTED
POC Oxazepam (BZO): NOT DETECTED
POC Oxycodone UR: NOT DETECTED
POC Secobarbital (BAR): NOT DETECTED

## 2021-05-29 LAB — LIPID PANEL
Cholesterol: 208 mg/dL — ABNORMAL HIGH (ref 0–200)
HDL: 67 mg/dL (ref >40)
LDL Cholesterol: 108 mg/dL — ABNORMAL HIGH (ref 0–99)
Total CHOL/HDL Ratio: 3.1 RATIO
Triglycerides: 163 mg/dL — ABNORMAL HIGH (ref <150)
VLDL: 33 mg/dL (ref 0–40)

## 2021-05-29 LAB — COMPREHENSIVE METABOLIC PANEL
ALT: 17 U/L (ref 0–44)
AST: 20 U/L (ref 15–41)
Albumin: 3.6 g/dL (ref 3.5–5.0)
Alkaline Phosphatase: 64 U/L (ref 38–126)
Anion gap: 8 (ref 5–15)
BUN: 15 mg/dL (ref 6–20)
CO2: 27 mmol/L (ref 22–32)
Calcium: 9.5 mg/dL (ref 8.9–10.3)
Chloride: 104 mmol/L (ref 98–111)
Creatinine, Ser: 1.12 mg/dL — ABNORMAL HIGH (ref 0.44–1.00)
GFR, Estimated: 58 mL/min — ABNORMAL LOW (ref >60)
Glucose, Bld: 151 mg/dL — ABNORMAL HIGH (ref 70–99)
Potassium: 3.7 mmol/L (ref 3.5–5.1)
Sodium: 139 mmol/L (ref 135–145)
Total Bilirubin: 0.5 mg/dL (ref 0.3–1.2)
Total Protein: 6.9 g/dL (ref 6.5–8.1)

## 2021-05-29 LAB — HEMOGLOBIN A1C
Hgb A1c MFr Bld: 5.4 % (ref 4.8–5.6)
Mean Plasma Glucose: 108.28 mg/dL

## 2021-05-29 LAB — POC SARS CORONAVIRUS 2 AG -  ED: SARS Coronavirus 2 Ag: NEGATIVE

## 2021-05-29 LAB — RESP PANEL BY RT-PCR (FLU A&B, COVID) ARPGX2
Influenza A by PCR: NEGATIVE
Influenza B by PCR: NEGATIVE
SARS Coronavirus 2 by RT PCR: NEGATIVE

## 2021-05-29 LAB — POC SARS CORONAVIRUS 2 AG: SARSCOV2ONAVIRUS 2 AG: NEGATIVE

## 2021-05-29 LAB — MAGNESIUM: Magnesium: 2.2 mg/dL (ref 1.7–2.4)

## 2021-05-29 LAB — ETHANOL: Alcohol, Ethyl (B): <10 mg/dL (ref <10)

## 2021-05-29 LAB — TSH: TSH: 0.42 u[IU]/mL (ref 0.350–4.500)

## 2021-05-29 MED ORDER — MAGNESIUM HYDROXIDE 400 MG/5ML PO SUSP: 30.0000 mL | Freq: Every day | ORAL | Status: DC | PRN

## 2021-05-29 MED ORDER — METFORMIN HCL 500 MG PO TABS
1000.0000 mg | ORAL_TABLET | Freq: Two times a day (BID) | ORAL | Status: DC
  Administered 2021-05-29 – 2021-05-30 (×2): 1000 mg via ORAL
  Filled 2021-05-29 (×2): qty 2

## 2021-05-29 MED ORDER — HYDRALAZINE HCL 50 MG PO TABS
50.0000 mg | ORAL_TABLET | Freq: Three times a day (TID) | ORAL | Status: DC
Start: 2021-05-29 — End: 2021-05-30
  Administered 2021-05-29 – 2021-05-30 (×4): 50 mg via ORAL
  Filled 2021-05-29 (×4): qty 1

## 2021-05-29 MED ORDER — LOSARTAN POTASSIUM 50 MG PO TABS
50.0000 mg | ORAL_TABLET | Freq: Every day | ORAL | Status: DC
  Administered 2021-05-29 – 2021-05-30 (×2): 50 mg via ORAL
  Filled 2021-05-29 (×2): qty 1

## 2021-05-29 MED ORDER — HYDROCHLOROTHIAZIDE 25 MG PO TABS
25.0000 mg | ORAL_TABLET | Freq: Every day | ORAL | Status: DC
  Administered 2021-05-29 – 2021-05-30 (×2): 25 mg via ORAL
  Filled 2021-05-29 (×2): qty 1

## 2021-05-29 MED ORDER — CYCLOBENZAPRINE HCL 10 MG PO TABS: 10.0000 mg | ORAL_TABLET | Freq: Every day | ORAL | Status: DC | PRN

## 2021-05-29 MED ORDER — ADULT MULTIVITAMIN W/MINERALS CH
1.0000 | ORAL_TABLET | Freq: Every day | ORAL | Status: DC
  Administered 2021-05-29 – 2021-05-30 (×2): 1 via ORAL
  Filled 2021-05-29 (×2): qty 1

## 2021-05-29 MED ORDER — THIAMINE HCL 100 MG PO TABS
100.0000 mg | ORAL_TABLET | Freq: Every day | ORAL | Status: DC
  Administered 2021-05-30: 100 mg via ORAL
  Filled 2021-05-29: qty 1

## 2021-05-29 MED ORDER — HYDROXYZINE HCL 25 MG PO TABS: 25.0000 mg | ORAL_TABLET | Freq: Four times a day (QID) | ORAL | Status: DC | PRN

## 2021-05-29 MED ORDER — LOPERAMIDE HCL 2 MG PO CAPS: 2.0000 mg | ORAL_CAPSULE | ORAL | Status: DC | PRN

## 2021-05-29 MED ORDER — ATENOLOL 25 MG PO TABS
100.0000 mg | ORAL_TABLET | Freq: Every day | ORAL | Status: DC
  Administered 2021-05-29 – 2021-05-30 (×2): 100 mg via ORAL
  Filled 2021-05-29 (×2): qty 4

## 2021-05-29 MED ORDER — LORATADINE 10 MG PO TABS
10.0000 mg | ORAL_TABLET | Freq: Every day | ORAL | Status: DC
  Administered 2021-05-30: 10 mg via ORAL
  Filled 2021-05-29 (×2): qty 1

## 2021-05-29 MED ORDER — PANTOPRAZOLE SODIUM 40 MG PO TBEC
40.0000 mg | DELAYED_RELEASE_TABLET | Freq: Every day | ORAL | Status: DC
  Administered 2021-05-29 – 2021-05-30 (×2): 40 mg via ORAL
  Filled 2021-05-29 (×2): qty 1

## 2021-05-29 MED ORDER — GABAPENTIN 300 MG PO CAPS
300.0000 mg | ORAL_CAPSULE | Freq: Three times a day (TID) | ORAL | Status: DC
  Administered 2021-05-29 – 2021-05-30 (×3): 300 mg via ORAL
  Filled 2021-05-29 (×3): qty 1

## 2021-05-29 MED ORDER — ACETAMINOPHEN 325 MG PO TABS: 650.0000 mg | ORAL_TABLET | Freq: Four times a day (QID) | ORAL | Status: DC | PRN

## 2021-05-29 MED ORDER — BENZTROPINE MESYLATE 1 MG PO TABS
1.0000 mg | ORAL_TABLET | Freq: Every day | ORAL | Status: DC
  Administered 2021-05-29: 21:00:00 1 mg via ORAL
  Filled 2021-05-29: qty 1

## 2021-05-29 MED ORDER — LAMOTRIGINE 25 MG PO TABS
50.0000 mg | ORAL_TABLET | Freq: Every day | ORAL | Status: DC
  Administered 2021-05-30: 50 mg via ORAL
  Filled 2021-05-29: qty 2

## 2021-05-29 MED ORDER — ALUM & MAG HYDROXIDE-SIMETH 200-200-20 MG/5ML PO SUSP: 30.0000 mL | ORAL | Status: DC | PRN

## 2021-05-29 MED ORDER — TRAZODONE HCL 150 MG PO TABS
150.0000 mg | ORAL_TABLET | Freq: Every evening | ORAL | Status: DC | PRN
  Administered 2021-05-29: 21:00:00 150 mg via ORAL
  Filled 2021-05-29: qty 1

## 2021-05-29 MED ORDER — LORAZEPAM 1 MG PO TABS: 1.0000 mg | ORAL_TABLET | Freq: Four times a day (QID) | ORAL | Status: DC | PRN

## 2021-05-29 MED ORDER — ATORVASTATIN CALCIUM 40 MG PO TABS
40.0000 mg | ORAL_TABLET | Freq: Every day | ORAL | Status: DC
  Administered 2021-05-29: 21:00:00 40 mg via ORAL
  Filled 2021-05-29: qty 1

## 2021-05-29 MED ORDER — MELATONIN 3 MG PO TABS
3.0000 mg | ORAL_TABLET | Freq: Every day | ORAL | Status: DC
  Administered 2021-05-29: 21:00:00 3 mg via ORAL
  Filled 2021-05-29: qty 1

## 2021-05-29 MED ORDER — ALBUTEROL SULFATE HFA 108 (90 BASE) MCG/ACT IN AERS: 1.0000 | INHALATION_SPRAY | RESPIRATORY_TRACT | Status: DC | PRN

## 2021-05-29 MED ORDER — GLIPIZIDE 5 MG PO TABS
10.0000 mg | ORAL_TABLET | Freq: Every day | ORAL | Status: DC
  Administered 2021-05-29 – 2021-05-30 (×2): 10 mg via ORAL
  Filled 2021-05-29 (×2): qty 2

## 2021-05-29 MED ORDER — PRAZOSIN HCL 1 MG PO CAPS
1.0000 mg | ORAL_CAPSULE | Freq: Every day | ORAL | Status: DC
  Administered 2021-05-29: 21:00:00 1 mg via ORAL
  Filled 2021-05-29: qty 1

## 2021-05-29 MED ORDER — ONDANSETRON 4 MG PO TBDP: 4.0000 mg | ORAL_TABLET | Freq: Four times a day (QID) | ORAL | Status: DC | PRN

## 2021-05-29 MED ORDER — MELOXICAM 7.5 MG PO TABS
15.0000 mg | ORAL_TABLET | Freq: Every day | ORAL | Status: DC
  Administered 2021-05-29 – 2021-05-30 (×2): 15 mg via ORAL
  Filled 2021-05-29 (×2): qty 2

## 2021-05-29 MED ORDER — LAMOTRIGINE 100 MG PO TABS: 100.0000 mg | ORAL_TABLET | Freq: Every day | ORAL | Status: DC

## 2021-05-29 MED ORDER — THIAMINE HCL 100 MG/ML IJ SOLN
100.0000 mg | Freq: Once | INTRAMUSCULAR | Status: AC
  Administered 2021-05-29: 17:00:00 100 mg via INTRAMUSCULAR
  Filled 2021-05-29: qty 2

## 2021-05-29 NOTE — BH Assessment (Signed)
Pt arrived to [facility] with her ACTT team. Pt was referred by Sharyn Lull at [facility]. Pt stated that she has been struggling with depression and drug use for over 2-3 months. Pt stated that she is experiencing S.I. with a plan to cut herself or burn herself. Pt stated that she self-harmed in the past by cutting herself 1 year ago. Pt stated that she would benefit from a substance use treatment program and mental health services for her depression. Pt denies H.I. Pt reports command hallucinations. Pt is emergent.

## 2021-05-29 NOTE — ED Notes (Signed)
Pt sleeping@this time. Breathign even and unlabored. Will continue to monitor for safety 

## 2021-05-29 NOTE — ED Notes (Signed)
Patient arrived on unit given toiletries, patient resting and easily redrected

## 2021-05-29 NOTE — Progress Notes (Signed)
CSW was contacted by [facility] and informed that pt would need detox prior to coming there.   CSW contacted [facility] Monia Pouch. CSW was informed that she could show up there and just needed her ID, three changes of underwear, and all medication that they are on. When asked if there was anything needed from this facility CSW was transferred to [place]. Matthew Saras explained that pt would need to have a ride to their facility as well, because if she was transported out there without a ride back and did not get accepted then she would be stranded. She stated if pt did received treatment here then she would need a copy of her discharge paperwork, medication list, and anything done here. No other concerns expressed. Contact ended without incident.   Chalmers Guest. Guerry Bruin, MSW, LCSW, [place] 05/29/2021 4:14 PM

## 2021-05-29 NOTE — Progress Notes (Addendum)
CSW contacted [employer] and was informed that he needed to speak with Sharyn Lull but she was on lunch break. CSW was able to leave a HIPPA compliant voicemail with contact information for follow up.   Chalmers Guest. Guerry Bruin, MSW, LCSW, [place] 05/29/2021 1:37 PM  CSW attempted to contact [place] again. No response and HIPPA compliant voicemail left again with contact information.   Chalmers Guest. Guerry Bruin, MSW, [place], [place] 05/29/2021 2:59 PM

## 2021-05-29 NOTE — ED Provider Notes (Signed)
Behavioral Health Admission H&P [facility] [place] [facility] OBS)  Date: 05/29/21 Patient Name: Maureen Swanson MRN: [id number] Chief Complaint:  Chief Complaint  Patient presents with   Addiction Problem   Depression      Diagnoses:  Final diagnoses:  Substance induced mood disorder ([place])    HPI: History of Present illness: Maureen Swanson is a 55 y.o. female. Patient presents voluntarily to [facility] behavioral health for walk-in assessment.  Patient is accompanied by her ACTT team care provider through [facility].  Maureen Swanson reports she was encouraged to seek evaluation at [facility] behavioral health in an effort to be admitted to residential substance use treatment.  She telephoned [facility] in [facility] earlier today,  indicated they may have availability and would prefer that she be evaluated at [facility] behavioral health prior to referral to residential substance use.  Recent stressors include substance use.  She reports chronic substance use.  Maureen Swanson reports readiness to stop using substances.  Most recently she has used crack cocaine, last use on yesterday.  She has also used alcohol, last use earlier today, consumed one beer today per her report.  Additionally she uses marijuana, last use on yesterday.  Maureen Swanson has been diagnosed with schizophrenia as well as bipolar disorder.  She is followed by [facility] ACT team.  She is unable to recall her medications reports for the past 2 months her act team has delivered her medication however she has only taken her medication "off and on."  Patient is assessed face-to-face by nurse practitioner.  She is seated in assessment area, no acute distress.  She is alert and oriented, pleasant and cooperative during assessment.   She presents with depressed mood, congruent affect. She denies suicidal and homicidal ideations. She contracts verbally for safety with this Probation officer.  She has normal speech and behavior.  She denies both auditory and visual  hallucinations.  Patient is able to converse coherently with goal-directed thoughts and no distractibility or preoccupation.  She denies paranoia.  Objectively there is no evidence of psychosis/mania or delusional thinking.  Emmer has been homeless in [place] for some time, most recently she has resided with friends.  She reports her friends use substances and she would prefer not to return to their home, concerned she may be triggered to relapse on substance.  Patient endorses average sleep and appetite.  She receives disability benefit, not employed outside the home.  She denies substance use aside from crack cocaine, alcohol and marijuana.  Patient offered support and encouragement. Patient gives verbal consent to speak with [facility] ACT team provider. Spoke with [facility] ACT team representative RN Lattie Haw [phone]. Lattie Haw reports patient has shared that she is homeless and concerned about remaining outside during increasingly cold weather . ACT team confirms that Maureen Swanson is compliant with long-acting injectable Abilify Maintenna 400 mg IM, last administration of Abilify on 05/06/2021. Next dose Abilify Maintenna 400 mg IM due 06/05/2021.     PHQ 2-9:   [place] ED from 05/29/2021 in [facility] Admission (Discharged) from 01/02/2021 in [place] [facility] ED from 01/01/2021 in [place] DEPT  C-SSRS RISK CATEGORY No Risk High Risk High Risk        Total Time spent with patient: 30 minutes  Musculoskeletal  Strength & Muscle Tone: within normal limits Gait & Station: normal Patient leans: N/A  Psychiatric Specialty Exam  Presentation General Appearance: Appropriate for Environment; Casual  Eye Contact:Good  Speech:Clear and Coherent;  Normal Rate  Speech Volume:Normal  Handedness:Right   Mood and Affect  Mood:Depressed  Affect:Appropriate; Congruent   Thought Process  Thought Processes:Coherent;  Goal Directed; Linear  Descriptions of Associations:Intact  Orientation:Full (Time, Place and Person)  Thought Content:Logical; WDL  Diagnosis of Schizophrenia or Schizoaffective disorder in past: Yes  Duration of Psychotic Symptoms: Greater than six months  Hallucinations:Hallucinations: None  Ideas of Reference:None  Suicidal Thoughts:Suicidal Thoughts: No  Homicidal Thoughts:Homicidal Thoughts: No   Sensorium  Memory:Immediate Good; Recent Good; Remote Good  Judgment:Fair  Insight:Fair   Executive Functions  Concentration:Good  Attention Span:Good  [place] of Knowledge:Good  Language:Good   Psychomotor Activity  Psychomotor Activity:Psychomotor Activity: Normal   Assets  Assets:Communication Skills; Desire for Improvement; Leisure Time; Physical Health; Resilience; Social Support   Sleep  Sleep:Sleep: Fair   Nutritional Assessment (For OBS and FBC admissions only) Has the patient had a weight loss or gain of 10 pounds or more in the last 3 months?: No Has the patient had a decrease in food intake/or appetite?: No Does the patient have dental problems?: No Does the patient have eating habits or behaviors that may be indicators of an eating disorder including binging or inducing vomiting?: No Has the patient recently lost weight without trying?: 0 Has the patient been eating poorly because of a decreased appetite?: 0 Malnutrition Screening Tool Score: 0    Physical Exam Vitals and nursing note reviewed.  Constitutional:      Appearance: Normal appearance. She is well-developed. She is obese.  HENT:     Head: Normocephalic and atraumatic.     Nose: Nose normal.  Cardiovascular:     Rate and Rhythm: Normal rate.  Pulmonary:     Effort: Pulmonary effort is normal.  Musculoskeletal:        General: Normal range of motion.     Cervical back: Normal range of motion.  Skin:    General: Skin is warm and dry.  Neurological:     Mental  Status: She is alert and oriented to person, place, and time.  Psychiatric:        Attention and Perception: Attention and perception normal.        Mood and Affect: Affect normal. Mood is depressed.        Speech: Speech normal.        Behavior: Behavior normal. Behavior is cooperative.        Thought Content: Thought content normal.        Cognition and Memory: Cognition and memory normal.        Judgment: Judgment normal.   Review of Systems  Constitutional: Negative.   HENT: Negative.    Eyes: Negative.   Respiratory: Negative.    Cardiovascular: Negative.   Gastrointestinal: Negative.   Genitourinary: Negative.   Musculoskeletal: Negative.   Skin: Negative.   Neurological: Negative.   Endo/Heme/Allergies: Negative.   Psychiatric/Behavioral:  Positive for depression and substance abuse.    Blood pressure (!) 140/100, pulse 98, temperature 98.4 F (36.9 C), temperature source Oral, SpO2 98 %. There is no height or weight on file to calculate BMI.  Past Psychiatric History: Schizoaffective disorder, bipolar type, alcohol use disorder, cocaine use disorder  Is the patient at risk to self? No  Has the patient been a risk to self in the past 6 months? No .    Has the patient been a risk to self within the distant past? No   Is the patient a risk to others?  No   Has the patient been a risk to others in the past 6 months? No   Has the patient been a risk to others within the distant past? 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 x 12 yrs.   Substance abuse ([place])    crack cocaine:  extensive treatment with [facility] from 2014 to 2018 for this and alcohol abuse.   TMJ (temporomandibular joint disorder)     Past Surgical History:  Procedure Laterality Date   BREAST LUMPECTOMY WITH NEEDLE LOCALIZATION  04/19/2012   Procedure: BREAST LUMPECTOMY WITH NEEDLE LOCALIZATION;  Surgeon: [name], MD;  Location: [facility];  Service: General;  Laterality: Right;   KNEE ARTHROSCOPY W/ PARTIAL MEDIAL MENISCECTOMY  05/01/2010   right    Family History:  Family History  Problem Relation Age of Onset   Diabetes Mother    Breast cancer Mother    Cancer Father    Bipolar disorder Maternal Aunt    Schizophrenia Maternal Grandfather    Alcoholism Maternal Uncle     Social History:  Social History   Socioeconomic History   Marital status: Single    Spouse name: Not on file   Number of children: Not on file   Years of education: Not on file   Highest education level: Not on file  Occupational History   Not on file  Tobacco Use   Smoking status: Former    Packs/day: 1.00    Years: 0.00    Pack years: 0.00    Types: Cigarettes   Smokeless tobacco: Never  Vaping Use   Vaping Use: Never used  Substance and Sexual Activity   Alcohol use: Yes    Comment: 3x 40 oz beers/day   Drug use: Yes    Frequency: 5.0 times per week    Types: Cocaine, "Crack" cocaine   Sexual activity: Not Currently    Birth control/protection: Post-menopausal  Other Topics Concern   Not on file  Social History Narrative   Not on file   Social Determinants of Health   Financial Resource Strain: Not on file  Food Insecurity: Not on file  Transportation Needs: Not on file  Physical Activity: Not on file  Stress: Not on file  Social Connections: Not on file  Intimate Partner Violence: Not on file    SDOH:  SDOH Screenings   Alcohol Screen: Medium Risk   Last Alcohol  Screening Score (AUDIT): 11  Depression (PHQ2-9): Not on file  Financial Resource Strain: Not on file  Food Insecurity: Not on file  Housing: Not on file  Physical Activity: Not on file  Social Connections: Not on file  Stress: Not on file  Tobacco Use: Medium Risk   Smoking Tobacco Use: Former   Smokeless Tobacco Use: Never   Passive Exposure: Not on file  Transportation Needs: Not on file    Last Labs:  Admission on 05/29/2021  Component Date Value Ref Range Status   SARS Coronavirus 2 by RT PCR 05/29/2021 NEGATIVE  NEGATIVE Final   Comment: (NOTE) SARS-CoV-2 target nucleic acids are NOT DETECTED.  The SARS-CoV-2 RNA is generally detectable in upper respiratory specimens during the acute phase of infection. The lowest concentration of SARS-CoV-2 viral copies this assay can detect is 138 copies/mL. A negative result does not preclude SARS-Cov-2 infection and should not be used as the sole basis for treatment or other patient management decisions. A negative result may occur with  improper specimen collection/handling, submission of specimen other than nasopharyngeal swab, presence of viral mutation(s) within the areas targeted by this assay, and inadequate number of viral copies(<138 copies/mL). A negative result must be combined with clinical observations, patient history, and epidemiological information. The expected result is Negative.  Fact Sheet for Patients:  EntrepreneurPulse.com.au  Fact Sheet for Healthcare Providers:  IncredibleEmployment.be  This test is no                          t yet approved or cleared by the [place] FDA and  has been authorized for detection and/or diagnosis of SARS-CoV-2 by FDA under an Emergency Use Authorization (EUA). This EUA will remain  in effect (meaning this test can be used) for the duration of the COVID-19 declaration under Section 564(b)(1) of the Act, 21 U.S.C.section  360bbb-3(b)(1), unless the authorization is terminated  or revoked sooner.       Influenza A by PCR 05/29/2021 NEGATIVE  NEGATIVE Final   Influenza B by PCR 05/29/2021 NEGATIVE  NEGATIVE Final   Comment: (NOTE) The Xpert Xpress SARS-CoV-2/FLU/RSV plus assay is intended as an aid in the diagnosis of influenza from Nasopharyngeal swab specimens and should not be used as a sole basis for treatment. Nasal washings and aspirates are unacceptable for Xpert Xpress SARS-CoV-2/FLU/RSV testing.  Fact Sheet for Patients: EntrepreneurPulse.com.au  Fact Sheet for Healthcare Providers: IncredibleEmployment.be  This test is not yet approved or cleared by the [place] FDA and has been authorized for detection and/or diagnosis of SARS-CoV-2 by FDA under an Emergency Use Authorization (EUA). This EUA will remain in effect (meaning this test can be used) for the duration of the COVID-19 declaration under Section 564(b)(1) of the Act, 21 U.S.C. section 360bbb-3(b)(1), unless the authorization is terminated or revoked.  Performed at [facility], [place] [address] [place], [place] [address]    WBC 05/29/2021 7.7  4.0 - 10.5 K/uL Final   RBC 05/29/2021 4.45  3.87 - 5.11 MIL/uL Final   Hemoglobin 05/29/2021 13.1  12.0 - 15.0 g/dL Final   HCT 05/29/2021 40.9  36.0 - 46.0 % Final   MCV 05/29/2021 91.9  80.0 - 100.0 fL Final   MCH 05/29/2021 29.4  26.0 - 34.0 pg Final   MCHC 05/29/2021 32.0  30.0 - 36.0 g/dL Final   RDW 05/29/2021 12.0  11.5 - 15.5 % Final   Platelets 05/29/2021 199  150 - 400 K/uL Final   nRBC 05/29/2021 0.0  0.0 - 0.2 % Final   Neutrophils Relative % 05/29/2021 75  % Final   Neutro Abs 05/29/2021 5.8  1.7 - 7.7 K/uL Final   Lymphocytes Relative 05/29/2021 19  % Final   Lymphs Abs 05/29/2021 1.5  0.7 - 4.0 K/uL Final   Monocytes Relative 05/29/2021 5  % Final   Monocytes  Absolute 05/29/2021 0.4  0.1 - 1.0 K/uL Final   Eosinophils  Relative 05/29/2021 1  % Final   Eosinophils Absolute 05/29/2021 0.1  0.0 - 0.5 K/uL Final   Basophils Relative 05/29/2021 0  % Final   Basophils Absolute 05/29/2021 0.0  0.0 - 0.1 K/uL Final   Immature Granulocytes 05/29/2021 0  % Final   Abs Immature Granulocytes 05/29/2021 0.02  0.00 - 0.07 K/uL Final   Performed at [facility], [place] [address], [place], [place] [address]   Sodium 05/29/2021 139  135 - 145 mmol/L Final   Potassium 05/29/2021 3.7  3.5 - 5.1 mmol/L Final   Chloride 05/29/2021 104  98 - 111 mmol/L Final   CO2 05/29/2021 27  22 - 32 mmol/L Final   Glucose, Bld 05/29/2021 151 (H)  70 - 99 mg/dL Final   Glucose reference range applies only to samples taken after fasting for at least 8 hours.   BUN 05/29/2021 15  6 - 20 mg/dL Final   Creatinine, Ser 05/29/2021 1.12 (H)  0.44 - 1.00 mg/dL Final   Calcium 05/29/2021 9.5  8.9 - 10.3 mg/dL Final   Total Protein 05/29/2021 6.9  6.5 - 8.1 g/dL Final   Albumin 05/29/2021 3.6  3.5 - 5.0 g/dL Final   AST 05/29/2021 20  15 - 41 U/L Final   ALT 05/29/2021 17  0 - 44 U/L Final   Alkaline Phosphatase 05/29/2021 64  38 - 126 U/L Final   Total Bilirubin 05/29/2021 0.5  0.3 - 1.2 mg/dL Final   GFR, Estimated 05/29/2021 58 (L)  >60 mL/min Final   Comment: (NOTE) Calculated using the CKD-EPI Creatinine Equation (2021)    Anion gap 05/29/2021 8  5 - 15 Final   Performed at [facility] [address], [place], [place] [address]   Hgb A1c MFr Bld 05/29/2021 5.4  4.8 - 5.6 % Final   Comment: (NOTE) Pre diabetes:          5.7%-6.4%  Diabetes:              >6.4%  Glycemic control for   <7.0% adults with diabetes    Mean Plasma Glucose 05/29/2021 108.28  mg/dL Final   Performed at [facility], [place] [address] [place], [place] [address]   Magnesium 05/29/2021 2.2  1.7 - 2.4 mg/dL Final   Performed at [facility] [address] [place], [place] [address]   Alcohol, Ethyl (B) 05/29/2021 <10  <10 mg/dL Final    Comment: (NOTE) Lowest detectable limit for serum alcohol is 10 mg/dL.  For medical purposes only. Performed at [facility], [place] [address] [place], [place] [address]    Cholesterol 05/29/2021 208 (H)  0 - 200 mg/dL Final   Triglycerides 05/29/2021 163 (H)  <150 mg/dL Final   HDL 05/29/2021 67  >40 mg/dL Final   Total CHOL/HDL Ratio 05/29/2021 3.1  RATIO Final   VLDL 05/29/2021 33  0 - 40 mg/dL Final   LDL Cholesterol 05/29/2021 108 (H)  0 - 99 mg/dL Final   Comment:        Total Cholesterol/HDL:CHD Risk Coronary Heart Disease Risk Table                     Men   Women  1/2 Average Risk   3.4   3.3  Average Risk       5.0   4.4  2  X Average Risk   9.6   7.1  3 X Average Risk  23.4   11.0        Use the calculated Patient Ratio above and the CHD Risk Table to determine the patient's CHD Risk.        ATP III CLASSIFICATION (LDL):  <100     mg/dL   Optimal  100-129  mg/dL   Near or Above                    Optimal  130-159  mg/dL   Borderline  160-189  mg/dL   High  >190     mg/dL   Very High Performed at [facility] [address], [place],  [address]    TSH 05/29/2021 0.420  0.350 - 4.500 uIU/mL Final   Comment: Performed by a 3rd Generation assay with a functional sensitivity of <=0.01 uIU/mL. Performed at [facility], [place] [address] [place], [place] [address]    POC Amphetamine UR 05/29/2021 None Detected  NONE DETECTED (Cut Off Level 1000 ng/mL) Final   POC Secobarbital (BAR) 05/29/2021 None Detected  NONE DETECTED (Cut Off Level 300 ng/mL) Final   POC Buprenorphine (BUP) 05/29/2021 None Detected  NONE DETECTED (Cut Off Level 10 ng/mL) Final   POC Oxazepam (BZO) 05/29/2021 None Detected  NONE DETECTED (Cut Off Level 300 ng/mL) Final   POC Cocaine UR 05/29/2021 Positive (A)  NONE DETECTED (Cut Off Level 300 ng/mL) Final   POC Methamphetamine UR 05/29/2021 None Detected  NONE DETECTED (Cut Off Level 1000 ng/mL) Final   POC Morphine  05/29/2021 None Detected  NONE DETECTED (Cut Off Level 300 ng/mL) Final   POC Oxycodone UR 05/29/2021 None Detected  NONE DETECTED (Cut Off Level 100 ng/mL) Final   POC Methadone UR 05/29/2021 None Detected  NONE DETECTED (Cut Off Level 300 ng/mL) Final   POC Marijuana UR 05/29/2021 None Detected  NONE DETECTED (Cut Off Level 50 ng/mL) Final   SARS Coronavirus 2 Ag 05/29/2021 Negative  Negative Final   SARSCOV2ONAVIRUS 2 AG 05/29/2021 NEGATIVE  NEGATIVE Final   Comment: (NOTE) SARS-CoV-2 antigen NOT DETECTED.   Negative results are presumptive.  Negative results do not preclude SARS-CoV-2 infection and should not be used as the sole basis for treatment or other patient management decisions, including infection  control decisions, particularly in the presence of clinical signs and  symptoms consistent with COVID-19, or in those who have been in contact with the virus.  Negative results must be combined with clinical observations, patient history, and epidemiological information. The expected result is Negative.  Fact Sheet for Patients: HandmadeRecipes.com.cy  Fact Sheet for Healthcare Providers: FuneralLife.at  This test is not yet approved or cleared by the [place] FDA and  has been authorized for detection and/or diagnosis of SARS-CoV-2 by FDA under an Emergency Use Authorization (EUA).  This EUA will remain in effect (meaning this test can be used) for the duration of  the COV                          ID-19 declaration under Section 564(b)(1) of the Act, 21 U.S.C. section 360bbb-3(b)(1), unless the authorization is terminated or revoked sooner.    Admission on 01/02/2021, Discharged on 01/10/2021  Component Date Value Ref Range Status   Hgb A1c MFr Bld 01/03/2021 5.6  4.8 - 5.6 % Final   Comment: (NOTE) Pre diabetes:  5.7%-6.4%  Diabetes:              >6.4%  Glycemic control for   <7.0% adults with diabetes     Mean Plasma Glucose 01/03/2021 114.02  mg/dL Final   Performed at [facility] [address] [place], [place] [address]   Cholesterol 01/03/2021 211 (H)  0 - 200 mg/dL Final   Triglycerides 01/03/2021 109  <150 mg/dL Final   HDL 01/03/2021 56  >40 mg/dL Final   Total CHOL/HDL Ratio 01/03/2021 3.8  RATIO Final   VLDL 01/03/2021 22  0 - 40 mg/dL Final   LDL Cholesterol 01/03/2021 133 (H)  0 - 99 mg/dL Final   Comment:        Total Cholesterol/HDL:CHD Risk Coronary Heart Disease Risk Table                     Men   Women  1/2 Average Risk   3.4   3.3  Average Risk       5.0   4.4  2 X Average Risk   9.6   7.1  3 X Average Risk  23.4   11.0        Use the calculated Patient Ratio above and the CHD Risk Table to determine the patient's CHD Risk.        ATP III CLASSIFICATION (LDL):  <100     mg/dL   Optimal  100-129  mg/dL   Near or Above                    Optimal  130-159  mg/dL   Borderline  160-189  mg/dL   High  >190     mg/dL   Very High Performed at [facility] [address] [place], [place] [address]    TSH 01/03/2021 0.872  0.350 - 4.500 uIU/mL Final   Comment: Performed by a 3rd Generation assay with a functional sensitivity of <=0.01 uIU/mL. Performed at [facility], [place] [address], [place], [place] [address]    Glucose-Capillary 01/02/2021 145 (H)  70 - 99 mg/dL Final   Glucose reference range applies only to samples taken after fasting for at least 8 hours.   Glucose-Capillary 01/03/2021 96  70 - 99 mg/dL Final   Glucose reference range applies only to samples taken after fasting for at least 8 hours.   Glucose-Capillary 01/04/2021 120 (H)  70 - 99 mg/dL Final   Glucose reference range applies only to samples taken after fasting for at least 8 hours.   Glucose-Capillary 01/04/2021 105 (H)  70 - 99 mg/dL Final   Glucose reference range applies only to samples taken after fasting for at least 8 hours.    Glucose-Capillary 01/05/2021 122 (H)  70 - 99 mg/dL Final   Glucose reference range applies only to samples taken after fasting for at least 8 hours.   Glucose-Capillary 01/05/2021 118 (H)  70 - 99 mg/dL Final   Glucose reference range applies only to samples taken after fasting for at least 8 hours.   Glucose-Capillary 01/05/2021 106 (H)  70 - 99 mg/dL Final   Glucose reference range applies only to samples taken after fasting for at least 8 hours.   Glucose-Capillary 01/06/2021 117 (H)  70 - 99 mg/dL Final   Glucose reference range applies only to samples taken after fasting for at least 8 hours.   Comment 1 01/06/2021 Notify RN   Final  Glucose-Capillary 01/06/2021 135 (H)  70 - 99 mg/dL Final   Glucose reference range applies only to samples taken after fasting for at least 8 hours.   Glucose-Capillary 01/06/2021 105 (H)  70 - 99 mg/dL Final   Glucose reference range applies only to samples taken after fasting for at least 8 hours.   Sodium 01/07/2021 144  135 - 145 mmol/L Final   Potassium 01/07/2021 4.0  3.5 - 5.1 mmol/L Final   Chloride 01/07/2021 107  98 - 111 mmol/L Final   CO2 01/07/2021 31  22 - 32 mmol/L Final   Glucose, Bld 01/07/2021 98  70 - 99 mg/dL Final   Glucose reference range applies only to samples taken after fasting for at least 8 hours.   BUN 01/07/2021 16  6 - 20 mg/dL Final   Creatinine, Ser 01/07/2021 0.94  0.44 - 1.00 mg/dL Final   Calcium 01/07/2021 9.4  8.9 - 10.3 mg/dL Final   Phosphorus 01/07/2021 3.9  2.5 - 4.6 mg/dL Final   Albumin 01/07/2021 3.6  3.5 - 5.0 g/dL Final   GFR, Estimated 01/07/2021 >60  >60 mL/min Final   Comment: (NOTE) Calculated using the CKD-EPI Creatinine Equation (2021)    Anion gap 01/07/2021 6  5 - 15 Final   Performed at [facility], [place] [address] [place], [place] [address]   Glucose-Capillary 01/06/2021 130 (H)  70 - 99 mg/dL Final   Glucose reference range applies only to samples taken after fasting for  at least 8 hours.   Glucose-Capillary 01/07/2021 100 (H)  70 - 99 mg/dL Final   Glucose reference range applies only to samples taken after fasting for at least 8 hours.   Comment 1 01/07/2021 Notify RN   Final   Comment 2 01/07/2021 Document in Chart   Final   Magnesium 01/07/2021 1.8  1.7 - 2.4 mg/dL Final   Performed at [facility], [place] [address] [place], [place] [address]   Glucose-Capillary 01/07/2021 107 (H)  70 - 99 mg/dL Final   Glucose reference range applies only to samples taken after fasting for at least 8 hours.   Glucose-Capillary 01/07/2021 115 (H)  70 - 99 mg/dL Final   Glucose reference range applies only to samples taken after fasting for at least 8 hours.   Glucose-Capillary 01/08/2021 90  70 - 99 mg/dL Final   Glucose reference range applies only to samples taken after fasting for at least 8 hours.   Comment 1 01/08/2021 Notify RN   Final   Comment 2 01/08/2021 Document in Chart   Final   Glucose-Capillary 01/08/2021 89  70 - 99 mg/dL Final   Glucose reference range applies only to samples taken after fasting for at least 8 hours.   Glucose-Capillary 01/08/2021 111 (H)  70 - 99 mg/dL Final   Glucose reference range applies only to samples taken after fasting for at least 8 hours.   Glucose-Capillary 01/08/2021 119 (H)  70 - 99 mg/dL Final   Glucose reference range applies only to samples taken after fasting for at least 8 hours.   Comment 1 01/08/2021 Notify RN   Final   Comment 2 01/08/2021 Document in Chart   Final   Glucose-Capillary 01/09/2021 105 (H)  70 - 99 mg/dL Final   Glucose reference range applies only to samples taken after fasting for at least 8 hours.   Glucose-Capillary 01/09/2021 106 (H)  70 - 99 mg/dL Final   Glucose reference range applies  only to samples taken after fasting for at least 8 hours.   Glucose-Capillary 01/09/2021 104 (H)  70 - 99 mg/dL Final   Glucose reference range applies only to samples taken after fasting for  at least 8 hours.   Glucose-Capillary 01/09/2021 123 (H)  70 - 99 mg/dL Final   Glucose reference range applies only to samples taken after fasting for at least 8 hours.   Glucose-Capillary 01/10/2021 88  70 - 99 mg/dL Final   Glucose reference range applies only to samples taken after fasting for at least 8 hours.  Admission on 01/01/2021, Discharged on 01/02/2021  Component Date Value Ref Range Status   Glucose-Capillary 01/01/2021 107 (H)  70 - 99 mg/dL Final   Glucose reference range applies only to samples taken after fasting for at least 8 hours.   Sodium 01/01/2021 138  135 - 145 mmol/L Final   Potassium 01/01/2021 4.3  3.5 - 5.1 mmol/L Final   Chloride 01/01/2021 107  98 - 111 mmol/L Final   CO2 01/01/2021 23  22 - 32 mmol/L Final   Glucose, Bld 01/01/2021 105 (H)  70 - 99 mg/dL Final   Glucose reference range applies only to samples taken after fasting for at least 8 hours.   BUN 01/01/2021 16  6 - 20 mg/dL Final   Creatinine, Ser 01/01/2021 0.94  0.44 - 1.00 mg/dL Final   Calcium 01/01/2021 8.8 (L)  8.9 - 10.3 mg/dL Final   Total Protein 01/01/2021 7.5  6.5 - 8.1 g/dL Final   Albumin 01/01/2021 4.0  3.5 - 5.0 g/dL Final   AST 01/01/2021 25  15 - 41 U/L Final   ALT 01/01/2021 11  0 - 44 U/L Final   Alkaline Phosphatase 01/01/2021 59  38 - 126 U/L Final   Total Bilirubin 01/01/2021 0.9  0.3 - 1.2 mg/dL Final   GFR, Estimated 01/01/2021 >60  >60 mL/min Final   Comment: (NOTE) Calculated using the CKD-EPI Creatinine Equation (2021)    Anion gap 01/01/2021 8  5 - 15 Final   Performed at [facility], [place] [address], [place], [place] [address]   Salicylate Lvl 41/66/0630 <7.0 (L)  7.0 - 30.0 mg/dL Final   Performed at [place] [address], [place], [place] [address]   Acetaminophen (Tylenol), Serum 01/01/2021 <10 (L)  10 - 30 ug/mL Final   Comment: (NOTE) Therapeutic concentrations vary significantly. A range of 10-30 ug/mL  may be  an effective concentration for many patients. However, some  are best treated at concentrations outside of this range. Acetaminophen concentrations >150 ug/mL at 4 hours after ingestion  and >50 ug/mL at 12 hours after ingestion are often associated with  toxic reactions.  Performed at [facility], [place] [address] [place], [place] [address]    Alcohol, Ethyl (B) 01/01/2021 11 (H)  <10 mg/dL Final   Comment: (NOTE) Lowest detectable limit for serum alcohol is 10 mg/dL.  For medical purposes only. Performed at [facility], [place] [address], [place], [place] [address]    Opiates 01/01/2021 NONE DETECTED  NONE DETECTED Final   Cocaine 01/01/2021 POSITIVE (A)  NONE DETECTED Final   Benzodiazepines 01/01/2021 NONE DETECTED  NONE DETECTED Final   Amphetamines 01/01/2021 NONE DETECTED  NONE DETECTED Final   Tetrahydrocannabinol 01/01/2021 NONE DETECTED  NONE DETECTED Final   Barbiturates 01/01/2021 NONE DETECTED  NONE DETECTED Final   Comment: (NOTE) DRUG SCREEN FOR MEDICAL PURPOSES ONLY.  IF  CONFIRMATION IS NEEDED FOR ANY PURPOSE, NOTIFY LAB WITHIN 5 DAYS.  LOWEST DETECTABLE LIMITS FOR URINE DRUG SCREEN Drug Class                     Cutoff (ng/mL) Amphetamine and metabolites    1000 Barbiturate and metabolites    200 Benzodiazepine                 456 Tricyclics and metabolites     300 Opiates and metabolites        300 Cocaine and metabolites        300 THC                            50 Performed at [facility], [place] [address] [place], [place] [address]    WBC 01/01/2021 6.8  4.0 - 10.5 K/uL Final   RBC 01/01/2021 4.11  3.87 - 5.11 MIL/uL Final   Hemoglobin 01/01/2021 11.8 (L)  12.0 - 15.0 g/dL Final   HCT 01/01/2021 38.0  36.0 - 46.0 % Final   MCV 01/01/2021 92.5  80.0 - 100.0 fL Final   MCH 01/01/2021 28.7  26.0 - 34.0 pg Final   MCHC 01/01/2021 31.1  30.0 - 36.0 g/dL Final   RDW 01/01/2021 12.3  11.5 - 15.5 %  Final   Platelets 01/01/2021 184  150 - 400 K/uL Final   nRBC 01/01/2021 0.0  0.0 - 0.2 % Final   Neutrophils Relative % 01/01/2021 77  % Final   Neutro Abs 01/01/2021 5.2  1.7 - 7.7 K/uL Final   Lymphocytes Relative 01/01/2021 17  % Final   Lymphs Abs 01/01/2021 1.2  0.7 - 4.0 K/uL Final   Monocytes Relative 01/01/2021 5  % Final   Monocytes Absolute 01/01/2021 0.4  0.1 - 1.0 K/uL Final   Eosinophils Relative 01/01/2021 1  % Final   Eosinophils Absolute 01/01/2021 0.0  0.0 - 0.5 K/uL Final   Basophils Relative 01/01/2021 0  % Final   Basophils Absolute 01/01/2021 0.0  0.0 - 0.1 K/uL Final   Immature Granulocytes 01/01/2021 0  % Final   Abs Immature Granulocytes 01/01/2021 0.02  0.00 - 0.07 K/uL Final   Performed at [facility], [place] [address] [place], [place] [address]   Color, Urine 01/01/2021 STRAW (A)  YELLOW Final   APPearance 01/01/2021 CLEAR  CLEAR Final   Specific Gravity, Urine 01/01/2021 1.006  1.005 - 1.030 Final   pH 01/01/2021 6.0  5.0 - 8.0 Final   Glucose, UA 01/01/2021 NEGATIVE  NEGATIVE mg/dL Final   Hgb urine dipstick 01/01/2021 NEGATIVE  NEGATIVE Final   Bilirubin Urine 01/01/2021 NEGATIVE  NEGATIVE Final   Ketones, ur 01/01/2021 NEGATIVE  NEGATIVE mg/dL Final   Protein, ur 01/01/2021 NEGATIVE  NEGATIVE mg/dL Final   Nitrite 01/01/2021 NEGATIVE  NEGATIVE Final   Leukocytes,Ua 01/01/2021 NEGATIVE  NEGATIVE Final   Performed at [place] [address] [place], [place] [address]   Acetaminophen (Tylenol), Serum 01/01/2021 <10 (L)  10 - 30 ug/mL Final   Comment: (NOTE) Therapeutic concentrations vary significantly. A range of 10-30 ug/mL  may be an effective concentration for many patients. However, some  are best treated at concentrations outside of this range. Acetaminophen concentrations >150 ug/mL at 4 hours after ingestion  and >50 ug/mL at 12 hours after ingestion are often associated with  toxic  reactions.  Performed at  [facility], [place] [address] [place], [place] [address]    SARS Coronavirus 2 by RT PCR 01/01/2021 NEGATIVE  NEGATIVE Final   Comment: (NOTE) SARS-CoV-2 target nucleic acids are NOT DETECTED.  The SARS-CoV-2 RNA is generally detectable in upper respiratory specimens during the acute phase of infection. The lowest concentration of SARS-CoV-2 viral copies this assay can detect is 138 copies/mL. A negative result does not preclude SARS-Cov-2 infection and should not be used as the sole basis for treatment or other patient management decisions. A negative result may occur with  improper specimen collection/handling, submission of specimen other than nasopharyngeal swab, presence of viral mutation(s) within the areas targeted by this assay, and inadequate number of viral copies(<138 copies/mL). A negative result must be combined with clinical observations, patient history, and epidemiological information. The expected result is Negative.  Fact Sheet for Patients:  EntrepreneurPulse.com.au  Fact Sheet for Healthcare Providers:  IncredibleEmployment.be  This test is no                          t yet approved or cleared by the [place] FDA and  has been authorized for detection and/or diagnosis of SARS-CoV-2 by FDA under an Emergency Use Authorization (EUA). This EUA will remain  in effect (meaning this test can be used) for the duration of the COVID-19 declaration under Section 564(b)(1) of the Act, 21 U.S.C.section 360bbb-3(b)(1), unless the authorization is terminated  or revoked sooner.       Influenza A by PCR 01/01/2021 NEGATIVE  NEGATIVE Final   Influenza B by PCR 01/01/2021 NEGATIVE  NEGATIVE Final   Comment: (NOTE) The Xpert Xpress SARS-CoV-2/FLU/RSV plus assay is intended as an aid in the diagnosis of influenza from Nasopharyngeal swab specimens and should not be used as a sole basis for  treatment. Nasal washings and aspirates are unacceptable for Xpert Xpress SARS-CoV-2/FLU/RSV testing.  Fact Sheet for Patients: EntrepreneurPulse.com.au  Fact Sheet for Healthcare Providers: IncredibleEmployment.be  This test is not yet approved or cleared by the [place] FDA and has been authorized for detection and/or diagnosis of SARS-CoV-2 by FDA under an Emergency Use Authorization (EUA). This EUA will remain in effect (meaning this test can be used) for the duration of the COVID-19 declaration under Section 564(b)(1) of the Act, 21 U.S.C. section 360bbb-3(b)(1), unless the authorization is terminated or revoked.  Performed at [facility], [place] [address], [place], [place] [address]    Glucose-Capillary 01/01/2021 117 (H)  70 - 99 mg/dL Final   Glucose reference range applies only to samples taken after fasting for at least 8 hours.   Glucose-Capillary 01/01/2021 95  70 - 99 mg/dL Final   Glucose reference range applies only to samples taken after fasting for at least 8 hours.   Glucose-Capillary 01/02/2021 109 (H)  70 - 99 mg/dL Final   Glucose reference range applies only to samples taken after fasting for at least 8 hours.   Glucose-Capillary 01/02/2021 124 (H)  70 - 99 mg/dL Final   Glucose reference range applies only to samples taken after fasting for at least 8 hours.    Allergies: Chocolate, Orange, Penicillins, Strawberry extract, and Tomato  PTA Medications: (Not in a hospital admission)   Medical Decision Making  Patient reviewed with Dr. Serafina Mitchell. Laboratory studies ordered including CBC, CMP, ethanol, A1c, hepatic function, lipid panel, magnesium and TSH.  Urine drug screen ordered.  EKG order initiated.  Current medications: -Acetaminophen 650  mg every 6 as needed/mild pain -Maalox 30 mL oral every 4 as needed/digestion -Magnesium hydroxide 30 mL daily as needed/mild constipation  Restarted home  medications including: -Albuterol 108 mcg/ACT 1 puff inhalation every 4 hours as needed/wheezing or shortness of breath -Atenolol 100 mg daily -Atorvastatin 40 mg daily at bedtime -Benztropine 1 mg daily at bedtime -Cyclobenzaprine 10 mg daily as needed/muscle spasm -Gabapentin 300 mg 3 times daily -Glipizide 10 mg daily -Hydralazine 50 mg every 8 hours -Hydrochlorothiazide 25 mg daily -Lamotrigine 50 mg daily -Loratadine 10 mg daily -Losartan 50 mg daily -Melatonin 3 mg daily at bedtime -Meloxicam 15 mg daily -Metformin 1000 mg twice daily with meals -Pantoprazole 40 mg daily -Prazosin 1 mg nightly -Trazodone 150 mg nightly as needed/sleep  CIWA Q4/Ativan protocol including: -Lorazepam 1 mg every 6 hours as needed CIWA greater than 10 -Hydroxyzine 25 mg every 6 hours as needed/anxiety or agitation  -Loperamide 2 to 4 mg 2 to 4 mg p.o. as needed/diarrhea or loose stools -Ondansetron disintegrating tablet 4 mg every 6 hours as needed/nausea or vomiting -Multivitamin 1 tablet daily -Thiamine 100 mg IM once, then 100 mg p.o. daily     Recommendations  Based on my evaluation the patient does not appear to have an emergency medical condition.  Lucky Rathke, FNP 05/29/21  8:03 PM

## 2021-05-29 NOTE — ED Notes (Signed)
Patient has been waiting for a detox bed of which there are none at present.  Social worker continues to explore options for her.  She was admitted to observation until a bed is secured.  Patient denies avh shi or plan and has been motivated for care.  She has been cooperative with admission process. Home meds have been ordered home meds and has been given the same.  Encouraged her to seek out staff if overwhelmed by thoughts and feelings.  Will monitor and maintain safety while on unit.

## 2021-05-29 NOTE — ED Notes (Signed)
Pt sleeping@this time. Breathing even and unlabored. Will continue to monitor for safety 

## 2021-05-29 NOTE — Progress Notes (Signed)
CSW contacted the following facilities regarding detox beds:  [facility]: have beds available but have five clients waiting to be seen in the [facility].   [facility]: No response. HIPPA compliant voicemail left with contact information for follow up.   [facility]: stated pt needs to do a phone screening with Hoyle Sauer [phone]).  Provider notified.   Chalmers Guest. Guerry Bruin, MSW, [place], [place] 05/29/2021 3:51 PM

## 2021-05-30 LAB — PROLACTIN: Prolactin: 4.2 ng/mL — ABNORMAL LOW (ref 4.8–23.3)

## 2021-05-30 NOTE — Discharge Instructions (Addendum)

## 2021-05-30 NOTE — ED Notes (Signed)
Pt accepted 0800 meds and breakfast was provided. Pt called [facility]. Currently resting on pull out bed. No distress observed. Safety maintained and will continue to monitor.

## 2021-05-30 NOTE — Progress Notes (Addendum)
CSW was contacted by Selinda Eon [phone]) from [facility]. Selinda Eon requested pt assessment and medication list be sent for review prior to decision being made. She went on to state that they wanted to know if pt could stay another night. CSW explained that he did not have any control over this but would let the provider know. She asked if pt's ACT team would be involved with her afterwards. CSW stated that it looks like they have been involved while she has been here and it is noted that they are planning to continue being involved/in the process of looking for residential substance use treatment as well. Selinda Eon asked that the ACT team contact information be sent as well. CSW stated that this was included in the assessment. Selinda Eon agreed. No other concerns expressed. Contact ended without incident.   Provider made known of concerns/issues expressed by Selinda Eon on behalf of [facility]. Pt reported having a 30 day supply of medications in her possession. Provider also informed CSW that ACT team had been advised regarding discharge for today.   CSW sent Selinda Eon a secure email with requested information to [email or web address]. CSW awaiting call regarding decision.   Chalmers Guest. Guerry Bruin, MSW, LCSW, [place] 05/30/2021 11:04 AM   CSW attempted contact with Selinda Eon [phone]) from [facility] regarding decision. Unable to establish contact but HIPPA compliant voicemail left with contact information for follow up.   Chalmers Guest. Guerry Bruin, MSW, [place], [place] 05/30/2021 11:54 AM

## 2021-05-30 NOTE — Progress Notes (Signed)
CSW called [facility] services [phone]) and spoke with [facility]. She was informed that pt has bed at [facility]) tomorrow, 05/31/21. CSW went on to explain that pt is set for discharge today and was wondering is transportation to [facility] could be coordinated. Varney Biles took down information and stated that someone would return call shortly. No other concerns expressed. Contact ended without incident.   Chalmers Guest. Guerry Bruin, MSW, [place], [place] 05/30/2021 1:41 PM

## 2021-05-30 NOTE — ED Provider Notes (Signed)
FBC/OBS ASAP Discharge Summary  Date and Time: 05/30/2021 9:40 AM  Name: Maureen Swanson  MRN:  [id number]   Discharge Diagnoses:  Final diagnoses:  Substance induced mood disorder ([place])    Subjective: Patient states "I am okay."  She reports she is hopeful that she will be accepted into [name] today for residential substance use treatment. Patient is committed to her sobriety and motivated to seek treatment.  She is observed by this Merchand[employer], [facility] to request update on potential acceptance.  Patient is assessed face-to-face by nurse practitioner.  She is seated in observation area, eating breakfast upon my approach.  No acute distress.  She is alert and oriented, pleasant and cooperative during assessment.   She presents with euthymic mood, congruent affect.  She continues to deny suicidal and homicidal ideations. She easily contracts verbally for safety with this Probation officer.  She has normal speech and behavior.  She denies both auditory and visual hallucinations.  Patient is able to converse coherently with goal-directed thoughts and no distractibility or preoccupation.  Se denies paranoia.  Objectively there is no evidence of psychosis/mania or delusional thinking.  Patient endorses average sleep and appetite.  She reports readiness to discharge, remains hopeful she will be accepted to residential substance use treatment.  Discussed discharge planning if not accepted today, to include follow-up with outpatient ACT team.  Patient verbalizes understanding and agreement with treatment plan.  Patient offered support and encouragement.  Sharnita has been accepted to [facility] residential in [place] on 06/01/1999 7:22 AM.  With patient's consent, spoke with [facility] ACT team Representative Shelton Silvas RN.  Shelton Silvas reports someone from [facility] ACT team will transport Maureen Swanson to [facility] residential in [place] at 7 AM on tomorrow, 05/31/2021.  Shelton Silvas reports Maureen Swanson is well aware of  contact phone numbers and should call ACT team to advise where she will sleep tonight so that they can pick her up and transport her to [facility] residential tomorrow morning. Shelton Silvas reports Maureen Swanson is able to navigate a city bus to discharge to the home of a friend.  Shelton Silvas shares, has been homeless for several months and resides with several different friends in [place].    Stay Summary: HPI from 05/29/2021- 2003pm: History of Present illness: Maureen Swanson is a 55 y.o. female. Patient presents voluntarily to [facility] behavioral health for walk-in assessment.  Patient is accompanied by her ACTT team care provider through [facility].   Kemper reports she was encouraged to seek evaluation at [facility] behavioral health in an effort to be admitted to residential substance use treatment.  She telephoned [facility] in [facility] earlier today,  indicated they may have availability and would prefer that she be evaluated at [facility] behavioral health prior to referral to residential substance use.   Recent stressors include substance use.  She reports chronic substance use.  Nikol reports readiness to stop using substances.  Most recently she has used crack cocaine, last use on yesterday.  She has also used alcohol, last use earlier today, consumed one beer today per her report.  Additionally she uses marijuana, last use on yesterday.   [place] has been diagnosed with schizophrenia as well as bipolar disorder.  She is followed by [facility] team.  She is unable to recall her medications reports for the past 2 months her act team has delivered her medication however she has only taken her medication "off and on."   Patient is assessed face-to-face by nurse practitioner.  She is seated in assessment area, no acute distress.  She is alert and oriented, pleasant and cooperative during assessment.    She presents with depressed mood, congruent affect. She denies suicidal and homicidal  ideations. She contracts verbally for safety with this Probation officer.   She has normal speech and behavior.  She denies both auditory and visual hallucinations.  Patient is able to converse coherently with goal-directed thoughts and no distractibility or preoccupation.  She denies paranoia.  Objectively there is no evidence of psychosis/mania or delusional thinking.   [name] has been homeless in [place] for some time, most recently she has resided with friends.  She reports her friends use substances and she would prefer not to return to their home, concerned she may be triggered to relapse on substance.  Patient endorses average sleep and appetite.  She receives disability benefit, not employed outside the home.  She denies substance use aside from crack cocaine, alcohol and marijuana.   Patient offered support and encouragement. Patient gives verbal consent to speak with [facility] team provider. Spoke with [facility] ACT team representative RN Lattie Haw [phone]. Lattie Haw reports patient has shared that she is homeless and concerned about remaining outside during increasingly cold weather . ACT team confirms that Maureen Swanson is compliant with long-acting injectable Abilify Maintenna 400 mg IM, last administration of Abilify on 05/06/2021. Next dose Abilify Maintenna 400 mg IM due 06/05/2021.    Total Time spent with patient: 20 minutes  Past Psychiatric History: Schizoaffective disorder, bipolar type, alcohol use disorder, cocaine use disorder   Past Medical History:  Past Medical History:  Diagnosis Date   Anxiety    Arthritis    back and knees   Asthma    daily and prn inhalers   Atypical ductal hyperplasia of breast 03/2012   right   Bipolar 1 disorder (HCC)    CHF (congestive heart failure) (HCC)    Depression    Diabetes mellitus    diet-controlled   Gastric ulcer    GERD (gastroesophageal reflux disease)    Gout    Headache(784.0)    migraines   High cholesterol    Hypertension    under control,  has been on med. x 12 yrs.   Substance abuse ([place])    crack cocaine:  extensive treatment with [facility] from 2014 to 2018 for this and alcohol abuse.   TMJ (temporomandibular joint disorder)     Past Surgical History:  Procedure Laterality Date   BREAST LUMPECTOMY WITH NEEDLE LOCALIZATION  04/19/2012   Procedure: BREAST LUMPECTOMY WITH NEEDLE LOCALIZATION;  Surgeon: [name], MD;  Location: [facility];  Service: General;  Laterality: Right;   KNEE ARTHROSCOPY W/ PARTIAL MEDIAL MENISCECTOMY  05/01/2010   right   Family History:  Family History  Problem Relation Age of Onset   Diabetes Mother    Breast cancer Mother    Cancer Father    Bipolar disorder Maternal Aunt    Schizophrenia Maternal Grandfather    Alcoholism Maternal Uncle    Family Psychiatric History: None reported Social History:  Social History   Substance and Sexual Activity  Alcohol Use Yes   Comment: 3x 40 oz beers/day     Social History   Substance and Sexual Activity  Drug Use Yes   Frequency: 5.0 times per week   Types: Cocaine, "Crack" cocaine    Social History   Socioeconomic History   Marital status: Single  Spouse name: Not on file   Number of children: Not on file   Years of education: Not on file   Highest education level: Not on file  Occupational History   Not on file  Tobacco Use   Smoking status: Former    Packs/day: 1.00    Years: 0.00    Pack years: 0.00    Types: Cigarettes   Smokeless tobacco: Never  Vaping Use   Vaping Use: Never used  Substance and Sexual Activity   Alcohol use: Yes    Comment: 3x 40 oz beers/day   Drug use: Yes    Frequency: 5.0 times per week    Types: Cocaine, "Crack" cocaine   Sexual activity: Not Currently    Birth control/protection: Post-menopausal  Other Topics Concern   Not on file  Social History Narrative   Not on file   Social Determinants of Health   Financial Resource Strain:  Not on file  Food Insecurity: Not on file  Transportation Needs: Not on file  Physical Activity: Not on file  Stress: Not on file  Social Connections: Not on file   SDOH:  SDOH Screenings   Alcohol Screen: Medium Risk   Last Alcohol Screening Score (AUDIT): 11  Depression (PHQ2-9): Not on file  Financial Resource Strain: Not on file  Food Insecurity: Not on file  Housing: Not on file  Physical Activity: Not on file  Social Connections: Not on file  Stress: Not on file  Tobacco Use: Medium Risk   Smoking Tobacco Use: Former   Smokeless Tobacco Use: Never   Passive Exposure: Not on file  Transportation Needs: Not on file    Tobacco Cessation:  N/A, patient does not currently use tobacco products  Current Medications:  Current Facility-Administered Medications  Medication Dose Route Frequency Provider Last Rate Last Admin   acetaminophen (TYLENOL) tablet 650 mg  650 mg Oral Q6H PRN Lucky Rathke, FNP       albuterol (VENTOLIN HFA) 108 (90 Base) MCG/ACT inhaler 1 puff  1 puff Inhalation Q4H PRN Lucky Rathke, FNP       alum & mag hydroxide-simeth (MAALOX/MYLANTA) 200-200-20 MG/5ML suspension 30 mL  30 mL Oral Q4H PRN Lucky Rathke, FNP       atenolol (TENORMIN) tablet 100 mg  100 mg Oral Daily Lucky Rathke, FNP   100 mg at 05/30/21 0910   atorvastatin (LIPITOR) tablet 40 mg  40 mg Oral QHS Lucky Rathke, FNP   40 mg at 05/29/21 2112   benztropine (COGENTIN) tablet 1 mg  1 mg Oral QHS Lucky Rathke, FNP   1 mg at 05/29/21 2111   cyclobenzaprine (FLEXERIL) tablet 10 mg  10 mg Oral Daily PRN Lucky Rathke, FNP       gabapentin (NEURONTIN) capsule 300 mg  300 mg Oral TID Lucky Rathke, FNP   300 mg at 05/30/21 0910   glipiZIDE (GLUCOTROL) tablet 10 mg  10 mg Oral Daily Lucky Rathke, FNP   10 mg at 05/30/21 0908   hydrALAZINE (APRESOLINE) tablet 50 mg  50 mg Oral Q8H Lucky Rathke, FNP   50 mg at 05/30/21 5035   hydrochlorothiazide (HYDRODIURIL) tablet 25 mg  25 mg Oral Daily Lucky Rathke, FNP   25 mg at 05/30/21 0914   hydrOXYzine (ATARAX) tablet 25 mg  25 mg Oral Q6H PRN Lucky Rathke, FNP       lamoTRIgine (LAMICTAL) tablet 50 mg  50  mg Oral Daily Lucky Rathke, FNP   50 mg at 05/30/21 4174   loperamide (IMODIUM) capsule 2-4 mg  2-4 mg Oral PRN Lucky Rathke, FNP       loratadine (CLARITIN) tablet 10 mg  10 mg Oral Daily Lucky Rathke, FNP   10 mg at 05/30/21 0914   LORazepam (ATIVAN) tablet 1 mg  1 mg Oral Q6H PRN Lucky Rathke, FNP       losartan (COZAAR) tablet 50 mg  50 mg Oral Daily Lucky Rathke, FNP   50 mg at 05/30/21 0814   magnesium hydroxide (MILK OF MAGNESIA) suspension 30 mL  30 mL Oral Daily PRN Lucky Rathke, FNP       melatonin tablet 3 mg  3 mg Oral QHS Lucky Rathke, FNP   3 mg at 05/29/21 2111   meloxicam (MOBIC) tablet 15 mg  15 mg Oral Daily Lucky Rathke, FNP   15 mg at 05/30/21 4818   metFORMIN (GLUCOPHAGE) tablet 1,000 mg  1,000 mg Oral BID WC Lucky Rathke, FNP   1,000 mg at 05/30/21 5631   multivitamin with minerals tablet 1 tablet  1 tablet Oral Daily Lucky Rathke, FNP   1 tablet at 05/30/21 0909   ondansetron (ZOFRAN-ODT) disintegrating tablet 4 mg  4 mg Oral Q6H PRN Lucky Rathke, FNP       pantoprazole (PROTONIX) EC tablet 40 mg  40 mg Oral Daily Lucky Rathke, FNP   40 mg at 05/30/21 0910   prazosin (MINIPRESS) capsule 1 mg  1 mg Oral QHS Lucky Rathke, FNP   1 mg at 05/29/21 2112   thiamine tablet 100 mg  100 mg Oral Daily Lucky Rathke, FNP   100 mg at 05/30/21 0911   traZODone (DESYREL) tablet 150 mg  150 mg Oral QHS PRN Lucky Rathke, FNP   150 mg at 05/29/21 2111   Current Outpatient Medications  Medication Sig Dispense Refill   ABILIFY MAINTENA 400 MG PRSY prefilled syringe Inject 400 mg into the muscle every 30 (thirty) days. Last injection given on 01/10/2021. Next injection due 02/07/2021 (Patient taking differently: Inject 400 mg into the muscle every 30 (thirty) days.) 1 each    Accu-Chek Softclix Lancets lancets Check blood  glucose twice daily before meals 100 each 12   albuterol (VENTOLIN HFA) 108 (90 Base) MCG/ACT inhaler Inhale 1 puff into the lungs every 4 (four) hours as needed for wheezing or shortness of breath. (Patient taking differently: Inhale 2 puffs into the lungs every 4 (four) hours as needed for wheezing or shortness of breath.) 1 each 0   atenolol (TENORMIN) 100 MG tablet Take 100 mg by mouth daily.     atorvastatin (LIPITOR) 40 MG tablet Take 1 tablet (40 mg total) by mouth at bedtime. 30 tablet 0   benztropine (COGENTIN) 1 MG tablet Take 1 tablet (1 mg total) by mouth at bedtime. 60 tablet 0   Blood Glucose Monitoring Suppl (ACCU-CHEK AVIVA PLUS) w/Device KIT Check blood glucose twice daily before meals 1 kit 0   cetirizine (ZYRTEC) 10 MG tablet Take 10 mg by mouth daily as needed for allergies.     cyclobenzaprine (FLEXERIL) 10 MG tablet Take 10 mg by mouth daily as needed for muscle spasms.     gabapentin (NEURONTIN) 300 MG capsule Take 1 capsule (300 mg total) by mouth 3 (three) times daily. 90 capsule 0   glipiZIDE (GLUCOTROL) 10 MG tablet  Take 10 mg by mouth daily.     glucose blood (ACCU-CHEK AVIVA PLUS) test strip Check blood glucose twice daily before meals 100 each 12   hydrochlorothiazide (HYDRODIURIL) 12.5 MG tablet Take 12.5 mg by mouth daily.     lamoTRIgine (LAMICTAL) 100 MG tablet Take 100 mg by mouth daily.     melatonin 3 MG TABS tablet Take 1 tablet (3 mg total) by mouth at bedtime. 30 tablet 0   meloxicam (MOBIC) 15 MG tablet Take 1 tablet (15 mg total) by mouth daily. 30 tablet 0   metFORMIN (GLUCOPHAGE) 1000 MG tablet Take 1 tablet (1,000 mg total) by mouth 2 (two) times daily with a meal. 60 tablet 0   metoCLOPramide (REGLAN) 10 MG tablet Take 10 mg by mouth daily as needed for nausea or vomiting.     omeprazole (PRILOSEC) 20 MG capsule Take 20 mg by mouth daily.     prazosin (MINIPRESS) 1 MG capsule Take 1 mg by mouth at bedtime.     traZODone (DESYREL) 150 MG tablet Take  150-300 mg by mouth at bedtime as needed for sleep.      PTA Medications: (Not in a hospital admission)   Musculoskeletal  Strength & Muscle Tone: within normal limits Gait & Station: normal Patient leans: N/A  Psychiatric Specialty Exam  Presentation  General Appearance: Appropriate for Environment; Casual  Eye Contact:Good  Speech:Clear and Coherent; Normal Rate  Speech Volume:Normal  Handedness:Right   Mood and Affect  Mood:Euthymic  Affect:Appropriate; Congruent   Thought Process  Thought Processes:Coherent; Goal Directed; Linear  Descriptions of Associations:Intact  Orientation:Full (Time, Place and Person)  Thought Content:Logical; WDL  Diagnosis of Schizophrenia or Schizoaffective disorder in past: Yes  Duration of Psychotic Symptoms: Greater than six months   Hallucinations:Hallucinations: None  Ideas of Reference:None  Suicidal Thoughts:Suicidal Thoughts: No  Homicidal Thoughts:Homicidal Thoughts: No   Sensorium  Memory:Immediate Good; Recent Good; Remote Good  Judgment:Good  Insight:Fair   Executive Functions  Concentration:Good  Attention Span:Good  [place] of Knowledge:Good  Language:Good   Psychomotor Activity  Psychomotor Activity:Psychomotor Activity: Normal   Assets  Assets:Communication Skills; Desire for Improvement; Financial Resources/Insurance; Leisure Time; Social Support   Sleep  Sleep:Sleep: Fair   Nutritional Assessment (For OBS and [facility]BC admissions only) Has the patient had a weight loss or gain of 10 pounds or more in the last 3 months?: No Has the patient had a decrease in food intake/or appetite?: No Does the patient have dental problems?: No Does the patient have eating habits or behaviors that may be indicators of an eating disorder including binging or inducing vomiting?: No Has the patient recently lost weight without trying?: 0 Has the patient been eating poorly because of a decreased  appetite?: 0 Malnutrition Screening Tool Score: 0    Physical Exam  Physical Exam Vitals and nursing note reviewed.  Constitutional:      Appearance: Normal appearance. She is well-developed. She is obese.  HENT:     Head: Normocephalic and atraumatic.     Nose: Nose normal.  Cardiovascular:     Rate and Rhythm: Normal rate.  Pulmonary:     Effort: Pulmonary effort is normal.  Musculoskeletal:        General: Normal range of motion.     Cervical back: Normal range of motion.  Skin:    General: Skin is warm and dry.  Neurological:     Mental Status: She is alert and oriented to person, place, and time.  Psychiatric:        Attention and Perception: Attention and perception normal.        Mood and Affect: Mood and affect normal.        Speech: Speech normal.        Behavior: Behavior normal. Behavior is cooperative.        Thought Content: Thought content normal.        Cognition and Memory: Cognition and memory normal.        Judgment: Judgment normal.   Review of Systems  Constitutional: Negative.   HENT: Negative.    Eyes: Negative.   Respiratory: Negative.    Cardiovascular: Negative.   Gastrointestinal: Negative.   Genitourinary: Negative.   Musculoskeletal: Negative.   Skin: Negative.   Neurological: Negative.   Endo/Heme/Allergies: Negative.   Psychiatric/Behavioral:  Positive for substance abuse.   Blood pressure 109/78, pulse 69, temperature 98.2 F (36.8 C), temperature source Oral, resp. rate 20, SpO2 97 %. There is no height or weight on file to calculate BMI.  Demographic Factors:  Low socioeconomic status  Loss Factors: Financial problems/change in socioeconomic status  Historical Factors: NA  Risk Reduction Factors:   Positive social support, Positive therapeutic relationship, and Positive coping skills or problem solving skills  Continued Clinical Symptoms:  Alcohol/Substance Abuse/Dependencies Previous Psychiatric Diagnoses and  Treatments  Cognitive Features That Contribute To Risk:  None    Suicide Risk:  Minimal: No identifiable suicidal ideation.  Patients presenting with no risk factors but with morbid ruminations; may be classified as minimal risk based on the severity of the depressive symptoms  Plan Of Care/Follow-up recommendations:  Patient reviewed with Dr. Serafina Mitchell. Follow-up with outpatient psychiatry, [facility]. Continue current medications.  Disposition: Discharge  Lucky Rathke, FNP 05/30/2021, 9:40 AM

## 2021-05-30 NOTE — ED Notes (Addendum)
[facility] called for services to go to [address]

## 2021-05-30 NOTE — ED Notes (Signed)
Discharge instructions provided and Pt stated understanding. Pt alert, orient and ambulatory prior to d/c from facility. Personal belongings returned from locker number 53. [facility] called for transportation services. Pt escorted to the [facility]ally port. Safety maintained.

## 2021-06-06 ENCOUNTER — Telehealth: Payer: Self-pay | Admitting: Internal Medicine

## 2021-06-06 MED ORDER — ALBUTEROL SULFATE HFA 108 (90 BASE) MCG/ACT IN AERS: 2.0000 | INHALATION_SPRAY | Freq: Four times a day (QID) | RESPIRATORY_TRACT | 4 refills | Status: DC | PRN

## 2021-06-06 NOTE — Telephone Encounter (Signed)
Silva Bandy, LPN  from [facility] calling asking about refill for Maureen Swanson, who is  now in residential treatment for 28 days then planning for better long term residential care. Rx sent to [place]

## 2021-06-08 ENCOUNTER — Encounter (HOSPITAL_BASED_OUTPATIENT_CLINIC_OR_DEPARTMENT_OTHER): Payer: Self-pay

## 2021-06-08 ENCOUNTER — Emergency Department (HOSPITAL_BASED_OUTPATIENT_CLINIC_OR_DEPARTMENT_OTHER)
Admission: EM | Admit: 2021-06-08 | Discharge: 2021-06-08 | Disposition: A | Discharge status: Home / Self Care | Payer: Medicaid Other | Attending: Emergency Medicine | Admitting: Emergency Medicine

## 2021-06-08 ENCOUNTER — Other Ambulatory Visit: Payer: Self-pay

## 2021-06-08 DIAGNOSIS — E11649 Type 2 diabetes mellitus with hypoglycemia without coma: Secondary | ICD-10-CM | POA: Diagnosis not present

## 2021-06-08 DIAGNOSIS — Z20822 Contact with and (suspected) exposure to covid-19: Secondary | ICD-10-CM | POA: Insufficient documentation

## 2021-06-08 DIAGNOSIS — E162 Hypoglycemia, unspecified: Secondary | ICD-10-CM

## 2021-06-08 DIAGNOSIS — J45909 Unspecified asthma, uncomplicated: Secondary | ICD-10-CM | POA: Insufficient documentation

## 2021-06-08 DIAGNOSIS — Z87891 Personal history of nicotine dependence: Secondary | ICD-10-CM | POA: Insufficient documentation

## 2021-06-08 DIAGNOSIS — I509 Heart failure, unspecified: Secondary | ICD-10-CM | POA: Insufficient documentation

## 2021-06-08 DIAGNOSIS — Z7984 Long term (current) use of oral hypoglycemic drugs: Secondary | ICD-10-CM | POA: Diagnosis not present

## 2021-06-08 DIAGNOSIS — I110 Hypertensive heart disease with heart failure: Secondary | ICD-10-CM | POA: Insufficient documentation

## 2021-06-08 DIAGNOSIS — Z79899 Other long term (current) drug therapy: Secondary | ICD-10-CM | POA: Diagnosis not present

## 2021-06-08 DIAGNOSIS — Z96651 Presence of right artificial knee joint: Secondary | ICD-10-CM | POA: Diagnosis not present

## 2021-06-08 LAB — RESP PANEL BY RT-PCR (FLU A&B, COVID) ARPGX2
Influenza A by PCR: NEGATIVE
Influenza B by PCR: NEGATIVE
SARS Coronavirus 2 by RT PCR: NEGATIVE

## 2021-06-08 LAB — CBG MONITORING, ED
Glucose-Capillary: 73 mg/dL (ref 70–99)
Glucose-Capillary: 97 mg/dL (ref 70–99)

## 2021-06-08 NOTE — ED Notes (Signed)
Baked chicken tray given w/crackers and cheese, graham crackers, and apple juice.

## 2021-06-08 NOTE — Discharge Instructions (Addendum)
Please hold metformin and glipizide and diabetes medications until you follow-up with your primary care doctor.

## 2021-06-08 NOTE — ED Triage Notes (Addendum)
Pt BIB EMS from [facility] after BG 48. EMS arrived, BG was 81, given 2 tubes of oral glucose (62 g). C/o dizziness, achy, tremulous. States takes metformin for diabetes.  States someone at facility has covid

## 2021-06-08 NOTE — ED Provider Notes (Signed)
[facility] EMERGENCY DEPARTMENT Provider Note   CSN: [id number] Arrival date & time: 06/08/21  1259     History Chief Complaint  Patient presents with   Hypoglycemia    Maureen Swanson is a 55 y.o. female.  The history is provided by the patient.  Hypoglycemia Initial blood sugar:  48 Blood sugar after intervention:  81 Severity:  Mild Duration:  6 hours Timing:  Intermittent Progression:  Waxing and waning Chronicity:  New Current diabetic therapy:  Glipizide and metformin Time since last antidiabetic medication:  6 hours Context: decreased oral intake   Relieved by:  Oral glucose Associated symptoms: no altered mental status, no anxiety, no decreased responsiveness, no dizziness, no obesity, no seizures, no shortness of breath, no speech difficulty, no sweats, no tremors, no visual change and no vomiting       Past Medical History:  Diagnosis Date   Anxiety    Arthritis    back and knees   Asthma    daily and prn inhalers   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 x 12 yrs.   Substance abuse ([place])    crack cocaine:  extensive treatment with [facility] from 2014 to 2018 for this and alcohol abuse.   TMJ (temporomandibular joint disorder)     Patient Active Problem List   Diagnosis Date Noted   Acute gout of left foot 12/06/2019   CHF (congestive heart failure) ([place]) 08/03/2016   Hypomagnesemia    Cannabis use disorder, moderate, dependence ([place]) 11/20/2015   Homicidal ideation    Tobacco use disorder 07/20/2015   Diabetes mellitus ([place]) 07/20/2015   Cocaine use disorder, moderate, dependence ([place]) 04/03/2015    Schizoaffective disorder, bipolar type ([place]) 04/03/2015   Alcohol use disorder, moderate, dependence ([place]) 04/03/2015   Acute ischemic stroke ([place]) 12/08/2014   Left-sided weakness 12/08/2014   Gout attack 11/12/2014   Unilateral primary osteoarthritis, right knee 11/08/2014   Diabetic peripheral neuropathy ([place]) 11/04/2014   Mixed hyperlipidemia 11/04/2014   Small vessel disease, cerebrovascular 11/04/2014   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   Cocaine abuse ([place]) 02/19/2012   Knee pain 10/17/2010   MAMMOGRAM, ABNORMAL, RIGHT 08/05/2010   Dyslipidemia 06/21/2010   AMENORRHEA, SECONDARY 10/29/2009   HERPES ZOSTER 08/20/2009   GERD 10/11/2007   Essential hypertension 02/26/2007    Past Surgical History:  Procedure Laterality Date   BREAST LUMPECTOMY WITH NEEDLE LOCALIZATION  04/19/2012   Procedure: BREAST LUMPECTOMY WITH NEEDLE LOCALIZATION;  Surgeon: [name], MD;  Location: [facility];  Service: General;  Laterality: Right;   KNEE ARTHROSCOPY W/ PARTIAL MEDIAL MENISCECTOMY  05/01/2010   right     OB History     Gravida  3   Para  2   Term  2   Preterm      AB  1   Living  2      SAB  1   IAB      Ectopic  Multiple      Live Births              Family History  Problem Relation Age of Onset   Diabetes Mother    Breast cancer Mother    Cancer Father    Bipolar disorder Maternal Aunt    Schizophrenia Maternal Grandfather    Alcoholism Maternal Uncle     Social History   Tobacco Use   Smoking status: Former    Packs/day: 1.00    Years: 0.00    Pack years: 0.00    Types: Cigarettes   Smokeless tobacco: Never  Vaping Use   Vaping Use: Never used  Substance Use Topics   Alcohol use: Yes    Comment: 3x 40 oz beers/day   Drug use: Yes    Frequency: 5.0 times per week    Types: Cocaine, "Crack" cocaine    Home Medications Prior to  Admission medications   Medication Sig Start Date End Date Taking? Authorizing Provider  ABILIFY MAINTENA 400 MG PRSY prefilled syringe Inject 400 mg into the muscle every 30 (thirty) days. Last injection given on 01/10/2021. Next injection due 02/07/2021 Patient taking differently: Inject 400 mg into the muscle every 30 (thirty) days. 01/09/21   Ethelene Hal, NP  Accu-Chek Softclix Lancets lancets Check blood glucose twice daily before meals 11/23/19   Mack Hook, MD  albuterol (VENTOLIN HFA) 108 (90 Base) MCG/ACT inhaler Inhale 1 puff into the lungs every 4 (four) hours as needed for wheezing or shortness of breath. Patient taking differently: Inhale 2 puffs into the lungs every 4 (four) hours as needed for wheezing or shortness of breath. 01/09/21   Ethelene Hal, NP  albuterol (VENTOLIN HFA) 108 (90 Base) MCG/ACT inhaler Inhale 2 puffs into the lungs every 6 (six) hours as needed for wheezing or shortness of breath. 06/06/21   Mack Hook, MD  atenolol (TENORMIN) 100 MG tablet Take 100 mg by mouth daily. 05/14/21   [provider]  atorvastatin (LIPITOR) 40 MG tablet Take 1 tablet (40 mg total) by mouth at bedtime. 01/09/21   Ethelene Hal, NP  benztropine (COGENTIN) 1 MG tablet Take 1 tablet (1 mg total) by mouth at bedtime. 01/09/21   Ethelene Hal, NP  Blood Glucose Monitoring Suppl (ACCU-CHEK AVIVA PLUS) w/Device KIT Check blood glucose twice daily before meals 11/23/19   Mack Hook, MD  cetirizine (ZYRTEC) 10 MG tablet Take 10 mg by mouth daily as needed for allergies. 05/14/21   [provider]  cyclobenzaprine (FLEXERIL) 10 MG tablet Take 10 mg by mouth daily as needed for muscle spasms. 05/14/21   [provider]  gabapentin (NEURONTIN) 300 MG capsule Take 1 capsule (300 mg total) by mouth 3 (three) times daily. 01/09/21   Ethelene Hal, NP  glipiZIDE (GLUCOTROL) 10 MG tablet Take 10 mg by mouth daily.  05/14/21   [provider]  glucose blood (ACCU-CHEK AVIVA PLUS) test strip Check blood glucose twice daily before meals 11/23/19   Mack Hook, MD  hydrochlorothiazide (HYDRODIURIL) 12.5 MG tablet Take 12.5 mg by mouth daily.    [provider]  lamoTRIgine (LAMICTAL) 100 MG tablet Take 100 mg by mouth daily. 05/14/21   [provider]  melatonin 3 MG TABS tablet Take 1 tablet (3 mg total) by mouth at bedtime. 01/09/21   Ethelene Hal, NP  meloxicam (MOBIC) 15 MG tablet Take 1 tablet (15 mg total) by mouth daily. 01/10/21   Jinny Blossom  Britton, NP  °metFORMIN (GLUCOPHAGE) 1000 MG tablet Take 1 tablet (1,000 mg total) by mouth 2 (two) times daily with a meal. 01/09/21   Parks, Laurie Britton, NP  °metoCLOPramide (REGLAN) 10 MG tablet Take 10 mg by mouth daily as needed for nausea or vomiting. 05/14/21   [provider]  °omeprazole (PRILOSEC) 20 MG capsule Take 20 mg by mouth daily. 05/14/21   [provider]  °prazosin (MINIPRESS) 1 MG capsule Take 1 mg by mouth at bedtime. 05/14/21   [provider]  °traZODone (DESYREL) 150 MG tablet Take 150-300 mg by mouth at bedtime as needed for sleep. 05/16/21   [provider]  ° ° °Allergies    °Chocolate, Orange, Penicillins, Strawberry extract, and Tomato ° °Review of Systems   °Review of Systems  °Constitutional:  Negative for chills, decreased responsiveness, diaphoresis and fever.  °HENT:  Negative for ear pain and sore throat.   °Eyes:  Negative for pain and visual disturbance.  °Respiratory:  Negative for cough and shortness of breath.   °Cardiovascular:  Negative for chest pain and palpitations.  °Gastrointestinal:  Negative for abdominal pain and vomiting.  °Genitourinary:  Negative for dysuria and hematuria.  °Musculoskeletal:  Negative for arthralgias and back pain.  °Skin:  Negative for color change and rash.  °Neurological:  Negative for dizziness, tremors, seizures, syncope and  speech difficulty.  °Psychiatric/Behavioral:  The patient is not nervous/anxious.   °All other systems reviewed and are negative. ° °Physical Exam °Updated Vital Signs °BP (!) 148/84 (BP Location: Right Arm)    Pulse 60    Temp 99.6 °F (37.6 °C) (Oral)    Resp 18    Ht 5' 4" (1.626 m)    Wt 92.5 kg    SpO2 98%    BMI 35.02 kg/m²  ° °Physical Exam °Vitals and nursing note reviewed.  °Constitutional:   °   General: She is not in acute distress. °   Appearance: She is well-developed. She is not ill-appearing.  °HENT:  °   Head: Normocephalic and atraumatic.  °   Nose: Nose normal.  °   Mouth/Throat:  °   Mouth: Mucous membranes are moist.  °Eyes:  °   Extraocular Movements: Extraocular movements intact.  °   Conjunctiva/sclera: Conjunctivae normal.  °   Pupils: Pupils are equal, round, and reactive to light.  °Cardiovascular:  °   Rate and Rhythm: Normal rate and regular rhythm.  °   Pulses: Normal pulses.  °   Heart sounds: Normal heart sounds. No murmur heard. °Pulmonary:  °   Effort: Pulmonary effort is normal. No respiratory distress.  °   Breath sounds: Normal breath sounds.  °Abdominal:  °   Palpations: Abdomen is soft.  °   Tenderness: There is no abdominal tenderness.  °Musculoskeletal:     °   General: No swelling.  °   Cervical back: Normal range of motion and neck supple.  °Skin: °   General: Skin is warm and dry.  °   Capillary Refill: Capillary refill takes less than 2 seconds.  °Neurological:  °   General: No focal deficit present.  °   Mental Status: She is alert.  °Psychiatric:     °   Mood and Affect: Mood normal.  ° ° °ED Results / Procedures / Treatments   °Labs °(all labs ordered are listed, but only abnormal results are displayed) °Labs Reviewed  °RESP PANEL BY RT-PCR (FLU A&B, COVID) ARPGX2  °CBG   MONITORING, ED  °CBG MONITORING, ED  ° ° °EKG °None ° °Radiology °No results found. ° °Procedures °Procedures  ° °Medications Ordered in ED °Medications - No data to display ° °ED Course  °I have reviewed  the triage vital signs and the nursing notes. ° °Pertinent labs & imaging results that were available during my care of the patient were reviewed by me and considered in my medical decision making (see chart for details). ° °  °MDM Rules/Calculators/A&P °                        ° °Riyanna M Parveen is here for low blood sugar.  Currently at [facility].  History of diabetes, bipolar, substance abuse.  Blood sugar was 49 this morning after getting her glipizide and metformin.  She had not had anything to eat this morning except for very light breakfast.  Blood sugar has been 70s and 80s this morning.  However still want her to be reevaluated.  She was given glucose with EMS.  Blood sugar upon arrival here is 73.  She is overall asymptomatic.  COVID and flu swab have been obtained.  We will allow her to eat and recheck her blood sugar.  Will recommend holding her metformin and glipizide and follow-up with her primary care doctor. Suspect sugar from decreased oral intake. Facility in charge of her meds currently. ° °Blood sugar stable.  COVID test and flu test negative.  We will have her hold her diabetes medications and follow-up with her primary care doctor.  Discharged in good condition. ° °This chart was dictated using voice recognition software.  Despite best efforts to proofread,  errors can occur which can change the documentation meaning.  ° ° ° ° °Final Clinical Impression(s) / ED Diagnoses °Final diagnoses:  °Hypoglycemia  ° ° °Rx / DC Orders °ED Discharge Orders   ° ° None  ° °  ° °  °, , DO °06/08/21 1425 ° °

## 2021-06-08 NOTE — ED Notes (Signed)
ED Provider at bedside.  No IV needed. Give patient food and oral fluids then recheck BS per v.o. [place] MD.

## 2021-06-10 ENCOUNTER — Telehealth (HOSPITAL_COMMUNITY): Payer: Self-pay | Admitting: Internal Medicine

## 2021-06-10 NOTE — BH Assessment (Signed)
Care Management - [place] Follow Up Discharges   Writer attempted to make contact with patient today and was unsuccessful.  No voicemail is set up.  Per chart review, patient was discharged to [facility] on 05-31-21.  Patient has established follow up care with her established ACTT Team

## 2021-06-14 ENCOUNTER — Encounter: Payer: Self-pay | Admitting: Internal Medicine

## 2021-06-14 ENCOUNTER — Other Ambulatory Visit: Payer: Self-pay

## 2021-06-14 ENCOUNTER — Ambulatory Visit (INDEPENDENT_AMBULATORY_CARE_PROVIDER_SITE_OTHER): Payer: Medicaid Other | Admitting: Internal Medicine

## 2021-06-14 VITALS — BP 148/88 | HR 64 | Resp 20 | Ht 64.0 in | Wt 218.5 lb

## 2021-06-14 DIAGNOSIS — E785 Hyperlipidemia, unspecified: Secondary | ICD-10-CM | POA: Diagnosis not present

## 2021-06-14 DIAGNOSIS — I10 Essential (primary) hypertension: Secondary | ICD-10-CM

## 2021-06-14 DIAGNOSIS — Z23 Encounter for immunization: Secondary | ICD-10-CM | POA: Diagnosis not present

## 2021-06-14 DIAGNOSIS — Z794 Long term (current) use of insulin: Secondary | ICD-10-CM

## 2021-06-14 DIAGNOSIS — E118 Type 2 diabetes mellitus with unspecified complications: Secondary | ICD-10-CM

## 2021-06-14 MED ORDER — LOSARTAN POTASSIUM 50 MG PO TABS: 50.0000 mg | ORAL_TABLET | Freq: Every day | ORAL | 11 refills | Status: DC

## 2021-06-14 MED ORDER — METFORMIN HCL 1000 MG PO TABS: ORAL_TABLET | ORAL | 11 refills | Status: DC

## 2021-06-14 MED ORDER — DICLOFENAC SODIUM 1 % EX GEL
4.0000 g | Freq: Four times a day (QID) | CUTANEOUS | 11 refills | Status: DC
Start: 2021-06-14 — End: 2021-08-19

## 2021-06-14 NOTE — Patient Instructions (Signed)
Continue to hold Glipizide Cut Metformin to 500 mg twice daily with meal Walk daily, preferably outside.    Cut calories to 2500 or less daily. Limit carbs/starches, sweet tasting foods.   Increase fiber in diet.  Warm bath and reading before bed. If unable to get to sleep in 20 minutes, get out of bed, go somewhere calm and read until you are sleepy, then back to bed. If awaken and unable to get back to sleep in 20 minutes--read as above until sleepy. Physical activity with a friend every day--gradually work up to 1 hour.  Do not exercise close to bedtime. No napping   Drink a glass of water before every meal Drink 6-8 glasses of water daily Eat three meals daily Eat a protein and healthy fat with every meal (eggs,fish, chicken, [place] and limit red meats) Eat 5 servings of vegetables daily, mix the colors Eat 2 servings of fruit daily with skin, if skin is edible Use smaller plates Put food/utensils down as you chew and swallow each bite Eat at a table with friends/family at least once daily, no TV Do not eat in front of the TV  Recent studies show that people who consume all of their calories in a 12 hour period lose weight more efficiently.  For example, if you eat your first meal at 7:00 a.m., your last meal of the day should be completed by 7:00 p.m.

## 2021-06-14 NOTE — Progress Notes (Signed)
Subjective:    Patient ID: Maureen Swanson, female   DOB: [birth year], 55 y.o.   MRN: [id number]   HPI  Now at [facility] for substance abuse treatment     DM:  Sugars were low 12.24.22 in ED, so both her glipizide and Metformin were stopped.  A1C at 5.4%.  Sugars now going up into 200s.  She is eating a 3000 cal diet with lots of carbs by description, though dessert with each meal is sugar free reportedly.  Given a snack every 2 hours.  2. Hypertension:  not on Losartan currently.  Unable to find why stopped.  BP not controlled with current Tenormin and HCTZ regimen  3.  Hyperlipidemia:  not at goal a couple of weeks ago.  Is taking meds regularly for past 1.5 weeks.    4.  Mental Health:  Has an ACTeam.  They are taking care of psychotropic meds.     Current Meds  Medication Sig   ABILIFY MAINTENA 400 MG PRSY prefilled syringe Inject 400 mg into the muscle every 30 (thirty) days. Last injection given on 01/10/2021. Next injection due 02/07/2021 (Patient taking differently: Inject 400 mg into the muscle every 30 (thirty) days.)   Accu-Chek Softclix Lancets lancets Check blood glucose twice daily before meals   atenolol (TENORMIN) 100 MG tablet Take 100 mg by mouth daily.   atorvastatin (LIPITOR) 40 MG tablet Take 1 tablet (40 mg total) by mouth at bedtime.   Blood Glucose Monitoring Suppl (ACCU-CHEK AVIVA PLUS) w/Device KIT Check blood glucose twice daily before meals   cetirizine (ZYRTEC) 10 MG tablet Take 10 mg by mouth daily as needed for allergies.   gabapentin (NEURONTIN) 300 MG capsule Take 1 capsule (300 mg total) by mouth 3 (three) times daily.   glucose blood (ACCU-CHEK AVIVA PLUS) test strip Check blood glucose twice daily before meals   hydrochlorothiazide (HYDRODIURIL) 12.5 MG tablet Take 12.5 mg by mouth daily.   lamoTRIgine (LAMICTAL) 100 MG tablet Take 100 mg by mouth daily.   melatonin 3 MG TABS tablet Take 1 tablet (3 mg total) by mouth at bedtime.   meloxicam (MOBIC) 15  MG tablet Take 1 tablet (15 mg total) by mouth daily.   omeprazole (PRILOSEC) 20 MG capsule Take 20 mg by mouth daily.   traZODone (DESYREL) 150 MG tablet Take 150-300 mg by mouth at bedtime as needed for sleep.   Is taking Prazosin  Allergies  Allergen Reactions   Chocolate Hives   Orange Hives and Other (See Comments)    "Acid foods"   Penicillins Hives and Other (See Comments)    Has patient had a PCN reaction causing immediate rash, facial/tongue/throat swelling, SOB or lightheadedness with hypotension: Yes Has patient had a PCN reaction causing severe rash involving mucus membranes or skin necrosis: Yes Has patient had a PCN reaction that required hospitalization No Has patient had a PCN reaction occurring within the last 10 years: No If all of the above answers are "NO", then may proceed with Cephalosporin use.    Strawberry Extract Swelling   Tomato Hives and Other (See Comments)    "acid foods"     Review of Systems    Objective:   BP (!) 148/88 (BP Location: Right Arm, Patient Position: Sitting, Cuff Size: Normal)    Pulse 64    Resp 20    Ht _0  (1.626 m)    Wt 218 lb 8 oz (99.1 kg)    BMI 37.51 kg/m  Physical Exam NAD Looks great.  Alert and more conversant than I have ever seen her. HEENT:   PERRL, EOMI Neck:  Supple, No adenopathy, no thyromegaly Chest:  CTA CV:  RRR with normal S1 and S2, No S3, S4 or murmur.  No carotid bruits.  Carotid, radial and DP pulses normal and equal Abd:  S, NT, No HSM or mass, + BS LE:  no edema    Assessment & Plan    DM:  cut calories to 2500 cals/day or less.  Limit carbs/starches, sweet tasting foods.  Discussed maintaining non caloric sweeteners will maintain craving for sweet taste.  Increase fiber in carbs.  2.  Hypertension:  Continue Atenolol and HCTZ.  Restart Losartan.  BP check in 2 weeks with CMP.  3.  Dyslipidemia:  Just back on Atorvastatin regularly for past 1.5 weeks.  FLP in 2 months.  4.  DM:  A1C was  in normal range with hypoglycemic episodes recently.  Glipizide and Metformin stopped and now sugars into 200s.  Restart Metformin at a lower dose 500 mg twice daily.  Repeat A1C in 2 months.  Suspect sugars were under better control as better diet and off alcohol.    5.  Influenza and Moderna Bivalent booster today.

## 2021-06-17 ENCOUNTER — Encounter (HOSPITAL_BASED_OUTPATIENT_CLINIC_OR_DEPARTMENT_OTHER): Payer: Self-pay | Admitting: Emergency Medicine

## 2021-06-17 ENCOUNTER — Other Ambulatory Visit: Payer: Self-pay

## 2021-06-17 ENCOUNTER — Emergency Department (HOSPITAL_BASED_OUTPATIENT_CLINIC_OR_DEPARTMENT_OTHER)
Admission: EM | Admit: 2021-06-17 | Discharge: 2021-06-17 | Disposition: A | Discharge status: Home / Self Care | Payer: Medicaid Other | Attending: Emergency Medicine | Admitting: Emergency Medicine

## 2021-06-17 DIAGNOSIS — Z7984 Long term (current) use of oral hypoglycemic drugs: Secondary | ICD-10-CM | POA: Diagnosis not present

## 2021-06-17 DIAGNOSIS — Z79899 Other long term (current) drug therapy: Secondary | ICD-10-CM | POA: Diagnosis not present

## 2021-06-17 DIAGNOSIS — E119 Type 2 diabetes mellitus without complications: Secondary | ICD-10-CM | POA: Diagnosis not present

## 2021-06-17 DIAGNOSIS — R519 Headache, unspecified: Secondary | ICD-10-CM | POA: Diagnosis present

## 2021-06-17 DIAGNOSIS — I10 Essential (primary) hypertension: Secondary | ICD-10-CM | POA: Diagnosis not present

## 2021-06-17 NOTE — ED Notes (Signed)
[facility] transportation called to pick up patient

## 2021-06-17 NOTE — Discharge Instructions (Signed)
As we discussed I recommend that you take your blood pressure medication as prescribed.  Please continue to monitor for worrisome symptoms including headache that does not resolve with ibuprofen, Tylenol, worsening blurry vision, chest pain, shortness of breath.  If you have any questions or concerns, or begin to experience any of these worrisome symptoms recommend that you return for further evaluation.

## 2021-06-17 NOTE — ED Triage Notes (Signed)
Pt having headache secondary to HTN.  Pt has prescription for new BP medication but has not started.

## 2021-06-17 NOTE — ED Provider Notes (Signed)
[facility] Provider Note   CSN: [id number] Arrival date & time: 06/17/21  0749     History  Chief Complaint  Patient presents with   Hypertension    Maureen Swanson is a 56 y.o. female with a past medical history significant for hypertension, diabetes, schizoaffective disorder, tobacco use disorder who presents with chief complaint of elevated blood pressure, headache.  Patient reports that she is staying at the [facility] facility, woke up this morning and elevated blood pressure prior to taking her blood pressure medications.  Patient reports that she has 3 medications for blood pressure including hydrochlorothiazide and to others.  Patient reports that normally her blood pressure is well controlled with these medications.  Patient reports that she had a transient frontal headache without blurry vision, confusion when her blood pressure was measured at a systolic of 409, but her headache has resolved at this time.  Patient denies chest pain, shortness of breath, numbness, tingling, stomach upset at this time.  Denies any other complaints at this time.    Hypertension Associated symptoms include headaches.  Hypertension Associated symptoms include headaches.  Hypertension Associated symptoms include headaches.      Home Medications Prior to Admission medications   Medication Sig Start Date End Date Taking? Authorizing Provider  ABILIFY MAINTENA 400 MG PRSY prefilled syringe Inject 400 mg into the muscle every 30 (thirty) days. Last injection given on 01/10/2021. Next injection due 02/07/2021 Patient taking differently: Inject 400 mg into the muscle every 30 (thirty) days. 01/09/21   Ethelene Hal, NP  Accu-Chek Softclix Lancets lancets Check blood glucose twice daily before meals 11/23/19   Mack Hook, MD  albuterol (VENTOLIN HFA) 108 (90 Base) MCG/ACT inhaler Inhale 1 puff into the lungs every 4 (four) hours as needed for wheezing or shortness  of breath. Patient not taking: Reported on 06/14/2021 01/09/21   Ethelene Hal, NP  albuterol (VENTOLIN HFA) 108 (90 Base) MCG/ACT inhaler Inhale 2 puffs into the lungs every 6 (six) hours as needed for wheezing or shortness of breath. Patient not taking: Reported on 06/14/2021 06/06/21   Mack Hook, MD  amLODipine (NORVASC) 5 MG tablet Take 5 mg by mouth daily. 06/12/21   [provider]  atenolol (TENORMIN) 100 MG tablet Take 100 mg by mouth daily. 05/14/21   [provider]  atorvastatin (LIPITOR) 40 MG tablet Take 1 tablet (40 mg total) by mouth at bedtime. 01/09/21   Ethelene Hal, NP  benztropine (COGENTIN) 1 MG tablet Take 1 tablet (1 mg total) by mouth at bedtime. Patient not taking: Reported on 06/14/2021 01/09/21   Ethelene Hal, NP  Blood Glucose Monitoring Suppl (ACCU-CHEK AVIVA PLUS) w/Device KIT Check blood glucose twice daily before meals 11/23/19   Mack Hook, MD  cetirizine (ZYRTEC) 10 MG tablet Take 10 mg by mouth daily as needed for allergies. 05/14/21   [provider]  cyclobenzaprine (FLEXERIL) 10 MG tablet Take 10 mg by mouth daily as needed for muscle spasms. Patient not taking: Reported on 06/14/2021 05/14/21   [provider]  diclofenac Sodium (VOLTAREN) 1 % GEL Apply 4 g topically 4 (four) times daily. As needed 06/14/21   Mack Hook, MD  gabapentin (NEURONTIN) 300 MG capsule Take 1 capsule (300 mg total) by mouth 3 (three) times daily. 01/09/21   Ethelene Hal, NP  glipiZIDE (GLUCOTROL) 10 MG tablet Take 10 mg by mouth daily. Patient not taking: Reported on 06/14/2021 05/14/21   [provider]  glucose blood (ACCU-CHEK AVIVA PLUS) test strip Check blood glucose twice daily before meals 11/23/19   Mack Hook, MD  hydrochlorothiazide (HYDRODIURIL) 12.5 MG tablet Take 12.5 mg by mouth daily.    [provider]  lamoTRIgine (LAMICTAL) 100 MG tablet Take 100 mg  by mouth daily. 05/14/21   [provider]  losartan (COZAAR) 50 MG tablet Take 1 tablet (50 mg total) by mouth daily. 06/14/21   Mack Hook, MD  melatonin 3 MG TABS tablet Take 1 tablet (3 mg total) by mouth at bedtime. 01/09/21   Ethelene Hal, NP  meloxicam (MOBIC) 15 MG tablet Take 1 tablet (15 mg total) by mouth daily. 01/10/21   Ethelene Hal, NP  metFORMIN (GLUCOPHAGE) 1000 MG tablet 1/2 tab by mouth twice daily with meals 06/14/21   Mack Hook, MD  metoCLOPramide (REGLAN) 10 MG tablet Take 10 mg by mouth daily as needed for nausea or vomiting. Patient not taking: Reported on 06/14/2021 05/14/21   [provider]  omeprazole (PRILOSEC) 20 MG capsule Take 20 mg by mouth daily. 05/14/21   [provider]  prazosin (MINIPRESS) 1 MG capsule Take 1 mg by mouth at bedtime. Patient not taking: Reported on 06/14/2021 05/14/21   [provider]  traZODone (DESYREL) 150 MG tablet Take 150-300 mg by mouth at bedtime as needed for sleep. 05/16/21   [provider]      Allergies    Chocolate, Orange, Penicillins, Strawberry extract, and Tomato    Review of Systems   Review of Systems  Neurological:  Positive for headaches.  All other systems reviewed and are negative.  Physical Exam Updated Vital Signs BP (!) 177/88    Pulse (!) 56    Temp 98.4 F (36.9 C) (Oral)    Resp 18    Ht '5\' 4"'  (1.626 m)    Wt 98 kg    SpO2 97%    BMI 37.08 kg/m  Physical Exam Vitals and nursing note reviewed.  Constitutional:      General: She is not in acute distress.    Appearance: Normal appearance.  HENT:     Head: Normocephalic and atraumatic.  Eyes:     General:        Right eye: No discharge.        Left eye: No discharge.     Extraocular Movements: Extraocular movements intact.     Pupils: Pupils are equal, round, and reactive to light.  Cardiovascular:     Rate and Rhythm: Normal rate and regular rhythm.  Pulmonary:      Effort: Pulmonary effort is normal. No respiratory distress.  Musculoskeletal:        General: No deformity.  Skin:    General: Skin is warm and dry.  Neurological:     Mental Status: She is alert and oriented to person, place, and time.     Comments: Cranial nerves III through XII grossly intact.  Romberg negative, gait normal.  Alert and oriented x3.  Psychiatric:        Mood and Affect: Mood normal.        Behavior: Behavior normal.    ED Results / Procedures / Treatments   Labs (all labs ordered are listed, but only abnormal results are displayed) Labs Reviewed - No data to display  EKG None  Radiology No results found.  Procedures Procedures    Medications Ordered in ED Medications - No data to display  ED Course/ Medical Decision  Making/ A&P                           Medical Decision Making Amount and/or Complexity of Data Reviewed External Data Reviewed: notes.  This patient presents to the ED for concern of hypertension, this involves an extensive number of treatment options, and is a complaint that carries with it a high risk of complications and morbidity.  The differential diagnosis includes hypertensive emergency, ACS, CVA.   Co morbidities that complicate the patient evaluation  Obesity, tobacco abuse, substance abuse   Problem List / ED Course:  Hypertension   Social Determinants of Health:  Patient is a resident of [facility] at this time and requests assistance in return to her facility. Due to ongoing mental health treatment, she may need closer care to maintain appropriate use of her medications.   Dispostion:  After consideration of the diagnostic results and the patients response to treatment, I feel that the patent would benefit from re-establishing regular use of anti-hypertensive medication.  56 year old patient with history of hypertension, diabetes, schizoaffective disorder, tobacco, and substance abuse who presents with chief  complaint of abnormal vital signs from her [facility] care facility.  Patient reports that she woke with elevated blood pressure, headache prior to taking her hypertension medications.  Patient takes hydrochlorothiazide, amlodipine, losartan per her chart.  Patient does not have any worrisome secondary complaints in relation to her hypertension.  She did have a frontal nonfocal headache prior to arrival.  Patient reports total resolution of headache despite no treatment.  Patient denies blurry vision, chest pain, shortness of breath.  Patient reports that she has had normal appetite, bowel movements.  Patient reports that she is also diabetic, however she has been managing her sugars adequately.  Patient reports that she has not had anything to eat today, and is hungry.  Patient has grossly intact neurologic exam, is alert and oriented.  She is hypertensive on exam today, however without any other secondary complaints, and minimal clinical concern for acute ACS, CVA, or hypertensive emergency.  Will discharge with instructions to continue take her antihypertensive medication, and return for further evaluation if she has any complications or other complaints.   Final Clinical Impression(s) / ED Diagnoses Final diagnoses:  Hypertension, unspecified type    Rx / DC Orders ED Discharge Orders     None         Dorien Chihuahua 06/17/21 1022    Lajean Saver, MD 06/19/21 1224

## 2021-06-21 ENCOUNTER — Telehealth: Payer: Self-pay | Admitting: Internal Medicine

## 2021-06-23 ENCOUNTER — Emergency Department (HOSPITAL_BASED_OUTPATIENT_CLINIC_OR_DEPARTMENT_OTHER)
Admission: EM | Admit: 2021-06-23 | Discharge: 2021-06-23 | Disposition: A | Discharge status: Home / Self Care | Payer: Medicaid Other | Attending: Emergency Medicine | Admitting: Emergency Medicine

## 2021-06-23 ENCOUNTER — Encounter (HOSPITAL_BASED_OUTPATIENT_CLINIC_OR_DEPARTMENT_OTHER): Payer: Self-pay | Admitting: Emergency Medicine

## 2021-06-23 ENCOUNTER — Emergency Department (HOSPITAL_BASED_OUTPATIENT_CLINIC_OR_DEPARTMENT_OTHER): Payer: Medicaid Other

## 2021-06-23 ENCOUNTER — Other Ambulatory Visit: Payer: Self-pay

## 2021-06-23 DIAGNOSIS — I509 Heart failure, unspecified: Secondary | ICD-10-CM | POA: Insufficient documentation

## 2021-06-23 DIAGNOSIS — R42 Dizziness and giddiness: Secondary | ICD-10-CM | POA: Diagnosis present

## 2021-06-23 DIAGNOSIS — I10 Essential (primary) hypertension: Secondary | ICD-10-CM

## 2021-06-23 DIAGNOSIS — J45909 Unspecified asthma, uncomplicated: Secondary | ICD-10-CM | POA: Insufficient documentation

## 2021-06-23 DIAGNOSIS — M25561 Pain in right knee: Secondary | ICD-10-CM | POA: Diagnosis not present

## 2021-06-23 DIAGNOSIS — Z79899 Other long term (current) drug therapy: Secondary | ICD-10-CM | POA: Diagnosis not present

## 2021-06-23 DIAGNOSIS — M25562 Pain in left knee: Secondary | ICD-10-CM | POA: Insufficient documentation

## 2021-06-23 DIAGNOSIS — Z7984 Long term (current) use of oral hypoglycemic drugs: Secondary | ICD-10-CM | POA: Diagnosis not present

## 2021-06-23 DIAGNOSIS — I110 Hypertensive heart disease with heart failure: Secondary | ICD-10-CM | POA: Insufficient documentation

## 2021-06-23 DIAGNOSIS — E119 Type 2 diabetes mellitus without complications: Secondary | ICD-10-CM | POA: Insufficient documentation

## 2021-06-23 LAB — COMPREHENSIVE METABOLIC PANEL
ALT: 72 U/L — ABNORMAL HIGH (ref 0–44)
AST: 51 U/L — ABNORMAL HIGH (ref 15–41)
Albumin: 3.7 g/dL (ref 3.5–5.0)
Alkaline Phosphatase: 72 U/L (ref 38–126)
Anion gap: 8 (ref 5–15)
BUN: 17 mg/dL (ref 6–20)
CO2: 28 mmol/L (ref 22–32)
Calcium: 9.2 mg/dL (ref 8.9–10.3)
Chloride: 104 mmol/L (ref 98–111)
Creatinine, Ser: 0.86 mg/dL (ref 0.44–1.00)
GFR, Estimated: >60 mL/min (ref >60)
Glucose, Bld: 74 mg/dL (ref 70–99)
Potassium: 3.7 mmol/L (ref 3.5–5.1)
Sodium: 140 mmol/L (ref 135–145)
Total Bilirubin: 0.5 mg/dL (ref 0.3–1.2)
Total Protein: 7.1 g/dL (ref 6.5–8.1)

## 2021-06-23 LAB — CBC WITH DIFFERENTIAL/PLATELET
Abs Immature Granulocytes: 0.01 10*3/uL (ref 0.00–0.07)
Basophils Absolute: 0 10*3/uL (ref 0.0–0.1)
Basophils Relative: 0 %
Eosinophils Absolute: 0.1 10*3/uL (ref 0.0–0.5)
Eosinophils Relative: 2 %
HCT: 35.6 % — ABNORMAL LOW (ref 36.0–46.0)
Hemoglobin: 11.1 g/dL — ABNORMAL LOW (ref 12.0–15.0)
Immature Granulocytes: 0 %
Lymphocytes Relative: 23 %
Lymphs Abs: 1.4 10*3/uL (ref 0.7–4.0)
MCH: 29.5 pg (ref 26.0–34.0)
MCHC: 31.2 g/dL (ref 30.0–36.0)
MCV: 94.7 fL (ref 80.0–100.0)
Monocytes Absolute: 0.4 10*3/uL (ref 0.1–1.0)
Monocytes Relative: 7 %
Neutro Abs: 4.2 10*3/uL (ref 1.7–7.7)
Neutrophils Relative %: 68 %
Platelets: 178 10*3/uL (ref 150–400)
RBC: 3.76 MIL/uL — ABNORMAL LOW (ref 3.87–5.11)
RDW: 12.8 % (ref 11.5–15.5)
WBC: 6.2 10*3/uL (ref 4.0–10.5)
nRBC: 0 % (ref 0.0–0.2)

## 2021-06-23 LAB — TROPONIN I (HIGH SENSITIVITY)
Troponin I (High Sensitivity): 10 ng/L (ref <18)
Troponin I (High Sensitivity): 10 ng/L (ref <18)

## 2021-06-23 MED ORDER — HYDROCHLOROTHIAZIDE 12.5 MG PO TABS: 25.0000 mg | ORAL_TABLET | Freq: Every day | ORAL | 0 refills | Status: DC

## 2021-06-23 MED ORDER — HYDROCHLOROTHIAZIDE 25 MG PO TABS
25.0000 mg | ORAL_TABLET | Freq: Once | ORAL | Status: AC
  Administered 2021-06-23: 25 mg via ORAL
  Filled 2021-06-23: qty 1

## 2021-06-23 MED ORDER — AMLODIPINE BESYLATE 5 MG PO TABS: 10.0000 mg | ORAL_TABLET | Freq: Every day | ORAL | 0 refills | Status: DC

## 2021-06-23 MED ORDER — AMLODIPINE BESYLATE 5 MG PO TABS
5.0000 mg | ORAL_TABLET | Freq: Once | ORAL | Status: AC
  Administered 2021-06-23: 5 mg via ORAL
  Filled 2021-06-23: qty 1

## 2021-06-23 NOTE — ED Notes (Signed)
Light green tube redrawn and taken to lab.

## 2021-06-23 NOTE — ED Triage Notes (Signed)
Pt arrives pov, steady gait to triage, c/o HTN, dizziness and bilateral knee pain, worsening last night

## 2021-06-23 NOTE — ED Provider Notes (Signed)
Maureen Swanson   CSN: [id number] Arrival date & time: 06/23/21  1044     History  Chief Complaint  Patient presents with   Hypertension    Maureen Swanson is a 56 y.o. female.  HPI     56 year old female with a history of diabetes, hypertension, hyperlipidemia, atypical ductal hyperplasia of the breast, bipolar disorder, CHF, substance abuse, TMJ, currently residing at [facility]     Bilateral knee pain for about 3 months, hurts when trying to walk, has not seen anyone yet, progressively worsening.  No trauma, falls, injuries.  Years ago had meniscus cleaned out on both. No longer has orthopedic dr.  Has not been taking anything for it, diclofenac gel twice a day, not helping much  Blood pressures have been high for years  Had some chest pain today which is why she presented to the ED. When blood pressure high, feels dizzy head, has been for a while, 5-6 months will feel like room spinning when pressure high but then when bp better able to walk better;  Feeling a little dizzy now. No headache. 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  Tardive dyskinesia as well.  Some visual changes come and go over last 6 months as well.  No chest pain right now, CP before lasted 5 minutes then resolved.  Had associated dyspnea for 5 minutes. No nausea.   Saw dr week before last, has another appt week before last  Losartan and hctz amlodipine, losartan prazosin   Past Medical History:  Diagnosis Date   Anxiety    Arthritis    back and knees   Asthma    daily and prn inhalers   Atypical ductal hyperplasia of breast 03/2012   right   Bipolar 1 disorder [place]    CHF (congestive heart failure) (HCC)    Depression    Diabetes mellitus    diet-controlled   Gastric ulcer    GERD (gastroesophageal reflux disease)    Gout    Headache(784.0)    migraines   High cholesterol    Hypertension    under control, has been on med. x 12 yrs.    [place]stance [place]    crack cocaine:  [facility] this and alcohol abuse.   TMJ (temporomandibular joint disorder)     Home Medications Prior to Admission medications   Medication Sig Start Date End Date Taking? Authorizing Provider  ABILIFY MAINTENA 400 MG PRSY prefilled syringe Inject 400 mg into the muscle every 30 (thirty) days. Last injection given on 01/10/2021. Next injection due 02/07/2021 Patient taking differently: Inject 400 mg into the muscle every 30 (thirty) days. 01/09/21   PEthelene Hal NP  Accu-Chek Softclix Lancets lancets Check blood glucose twice daily before meals 11/23/19   MMack Hook MD  albuterol (VENTOLIN HFA) 108 (90 Base) MCG/ACT inhaler Inhale 1 puff into the lungs every 4 (four) hours as needed for wheezing or shortness of breath. Patient not taking: Reported on 06/14/2021 01/09/21   PEthelene Hal NP  albuterol (VENTOLIN HFA) 108 (90 Base) MCG/ACT inhaler Inhale 2 puffs into the lungs every 6 (six) hours as needed for wheezing or shortness of breath. Patient not taking: Reported on 06/14/2021 06/06/21   MMack Hook MD  amLODipine (NORVASC) 5 MG tablet Take 2 tablets (10 mg total) by mouth daily. 06/23/21   SGareth Morgan MD  atenolol (TENORMIN) 100 MG tablet Take 100 mg by mouth daily. 05/14/21   [provider]  atorvastatin (LIPITOR) 40 MG tablet Take 1 tablet (40 mg total) by mouth at bedtime. 01/09/21   Ethelene Hal, NP  benztropine (COGENTIN) 1 MG tablet Take 1 tablet (1 mg total) by mouth at bedtime. Patient not taking: Reported on 06/14/2021 01/09/21   Ethelene Hal, NP  Blood Glucose Monitoring Suppl (ACCU-CHEK AVIVA PLUS) w/Device KIT Check blood glucose twice daily before meals 11/23/19   Mack Hook, MD  cetirizine (ZYRTEC) 10 MG tablet Take 10 mg by mouth daily as needed for allergies. 05/14/21   [provider]  cyclobenzaprine (FLEXERIL) 10 MG tablet Take 10 mg by mouth daily as needed for muscle spasms. Patient not taking: Reported on 06/14/2021 05/14/21   [provider]  diclofenac Sodium (VOLTAREN) 1 % GEL Apply 4 g topically 4 (four) times daily. As needed 06/14/21   Mack Hook, MD  gabapentin (NEURONTIN) 300 MG capsule Take 1 capsule (300 mg total) by mouth 3 (three) times daily. 01/09/21   Ethelene Hal, NP  glipiZIDE (GLUCOTROL) 10 MG tablet Take 10 mg by mouth daily. Patient not taking: Reported on 06/14/2021 05/14/21   [provider]  glucose blood (ACCU-CHEK AVIVA PLUS) test strip Check blood glucose twice daily before meals 11/23/19   Mack Hook, MD  hydrochlorothiazide (HYDRODIURIL) 12.5 MG tablet Take 2 tablets (25 mg total) by mouth daily. 06/23/21 07/23/21  Gareth Morgan, MD  lamoTRIgine (LAMICTAL) 100 MG tablet Take 100 mg by mouth daily. 05/14/21   [provider]  losartan (COZAAR) 50 MG tablet Take 1 tablet (50 mg total) by mouth daily. 06/14/21   Mack Hook, MD  melatonin 3 MG TABS tablet Take 1 tablet (3 mg total) by mouth at bedtime. 01/09/21   Ethelene Hal, NP  meloxicam (MOBIC) 15 MG tablet Take 1 tablet (15 mg total) by mouth daily. 01/10/21   Ethelene Hal, NP  metFORMIN (GLUCOPHAGE) 1000 MG tablet 1/2 tab by mouth twice daily with meals 06/14/21   Mack Hook, MD  metoCLOPramide (REGLAN) 10 MG tablet Take 10 mg by mouth daily as needed for nausea or vomiting. Patient not taking: Reported on 06/14/2021 05/14/21   [provider]  omeprazole (PRILOSEC) 20 MG capsule Take 20 mg by mouth daily. 05/14/21   [provider]  prazosin (MINIPRESS) 1 MG capsule Take 1 mg by mouth at bedtime. Patient not taking: Reported on 06/14/2021 05/14/21   [provider]  traZODone (DESYREL) 150 MG tablet Take 150-300 mg by mouth at bedtime as needed for sleep. 05/16/21    [provider]      Allergies    Chocolate, Orange, Penicillins, Strawberry extract, and Tomato    Review of Systems   Review of Systems  Physical Exam Updated Vital Signs BP (!) 173/111    Pulse (!) 54    Temp 98.3 F (36.8 C) (Oral)    Resp 18    Ht '5\' 4"'  (1.626 m)    Wt 97.1 kg    SpO2 97%    BMI 36.73 kg/m  Physical Exam Vitals and nursing Swanson reviewed.  Constitutional:      General: She is not in acute distress.    Appearance: She is well-developed. She is not diaphoretic.  HENT:     Head: Normocephalic and atraumatic.  Eyes:     Conjunctiva/sclera: Conjunctivae normal.  Cardiovascular:     Rate and  Rhythm: Normal rate and regular rhythm.     Heart sounds: Normal heart sounds. No murmur heard.   No friction rub. No gallop.  Pulmonary:     Effort: Pulmonary effort is normal. No respiratory distress.     Breath sounds: Normal breath sounds. No wheezing or rales.  Abdominal:     General: There is no distension.     Palpations: Abdomen is soft.     Tenderness: There is no abdominal tenderness. There is no guarding.  Musculoskeletal:        General: No tenderness.     Cervical back: Normal range of motion.  Skin:    General: Skin is warm and dry.     Findings: No erythema or rash.  Neurological:     Mental Status: She is alert and oriented to person, place, and time.    ED Results / Procedures / Treatments   Labs (all labs ordered are listed, but only abnormal results are displayed) Labs Reviewed  CBC WITH DIFFERENTIAL/PLATELET - Abnormal; Notable for the following components:      Result Value   RBC 3.76 (*)    Hemoglobin 11.1 (*)    HCT 35.6 (*)    All other components within normal limits  COMPREHENSIVE METABOLIC PANEL - Abnormal; Notable for the following components:   AST 51 (*)    ALT 72 (*)    All other components within normal limits  TROPONIN I (HIGH SENSITIVITY)  TROPONIN I (HIGH SENSITIVITY)    EKG EKG  Interpretation  Date/Time:  Sunday June 23 2021 11:11:36 EST Ventricular Rate:  59 PR Interval:  162 QRS Duration: 86 QT Interval:  432 QTC Calculation: 427 R Axis:   24 Text Interpretation: Sinus bradycardia Septal infarct , age undetermined Abnormal ECG When compared with ECG of 29-May-2021 16:41, No significant change since last tracing Confirmed by Gareth Morgan [phone]) on 06/23/2021 2:01:44 PM  Radiology CT Head Wo Contrast  Result Date: 06/23/2021 CLINICAL DATA:  Dizziness. EXAM: CT HEAD WITHOUT CONTRAST TECHNIQUE: Contiguous axial images were obtained from the base of the skull through the vertex without intravenous contrast. COMPARISON:  MRI brain and CT head dated December 08, 2014. FINDINGS: Brain: No evidence of acute infarction, hemorrhage, hydrocephalus, extra-axial collection or mass lesion/mass effect. Vascular: No hyperdense vessel or unexpected calcification. Skull: Normal. Negative for fracture or focal lesion. Sinuses/Orbits: No acute finding. Other: None. IMPRESSION: No acute intracranial abnormality. Electronically Signed   By: Titus Dubin M.D.   On: 06/23/2021 13:11   DG Chest Portable 1 View  Result Date: 06/23/2021 CLINICAL DATA:  Dizziness. EXAM: PORTABLE CHEST 1 VIEW COMPARISON:  Chest x-ray dated January 01, 2021. FINDINGS: Unchanged mild cardiomegaly. No focal consolidation, pleural effusion, or pneumothorax. No acute osseous abnormality. IMPRESSION: No active disease. Electronically Signed   By: Titus Dubin M.D.   On: 06/23/2021 13:13    Procedures Procedures    Medications Ordered in ED Medications  amLODipine (NORVASC) tablet 5 mg (5 mg Oral Given 06/23/21 1407)  hydrochlorothiazide (HYDRODIURIL) tablet 25 mg (25 mg Oral Given 06/23/21 1402)    ED Course/ Medical Decision Making/ A&P                           Medical Decision Making   56 year old female with a history of diabetes, hypertension, hyperlipidemia, atypical ductal hyperplasia of the  breast, bipolar disorder, CHF, substance abuse, TMJ, currently residing at [facility] who presents with concern for bilateral  knee pain, hypertension.  CT head ordered given history of intermittent symptoms of dizziness, visual changes, headaches and elevated BP shows no acute abnormalities. Had brief CP today with other symptoms, normal troponin and unchanged ECG and have low suspicion for ACS.  CXR evaluated by me and radiology shows no acute abnormalities, no sign of pneumonia, pneumothorax.  Labs evaluated by me show no clinically significant abnormalities.  Reports the dizziness and visual changes occur with elevated BP and have been going on for 6 months, denies acute symptoms at time of my evaluation, do not feel transfer to [facility] for emergent MRI or TIA admission indicated given duration of symptoms.   BP elevated today without signs of BP emergency at this time.  Currently asymptomatic. Will increase BP medications, recommend follow up with PCP.    Has bilateral knee pain. Exam most consistent with bilateral patellar bursitis although consider other potential etiologies. No truama and do not suspect fracture or dislocation or feel XR will change acute management. No signs of septic arthritis, cellulitis, acute arterial thrombus or DVT.  Had previous surgery many years ago, recommend outpt Orthopedic follow up,, given knee sleeve for comfort, declines crutches/walker.  Recommend elevation when resting. Patient discharged in stable condition with understanding of reasons to return.             Final Clinical Impression(s) / ED Diagnoses Final diagnoses:  Acute pain of both knees  Hypertension, unspecified type    Rx / DC Orders ED Discharge Orders          Ordered    amLODipine (NORVASC) 5 MG tablet  Daily,   Status:  Discontinued        06/23/21 1413    hydrochlorothiazide (HYDRODIURIL) 12.5 MG tablet  Daily,   Status:  Discontinued        06/23/21 1413    amLODipine (NORVASC) 5 MG  tablet  Daily        06/23/21 1439    hydrochlorothiazide (HYDRODIURIL) 12.5 MG tablet  Daily        06/23/21 1439              Gareth Morgan, MD 06/23/21 2353

## 2021-06-23 NOTE — ED Notes (Signed)
Redrawn light green and lavender tubes

## 2021-07-02 ENCOUNTER — Other Ambulatory Visit (INDEPENDENT_AMBULATORY_CARE_PROVIDER_SITE_OTHER): Payer: Medicaid Other

## 2021-07-02 ENCOUNTER — Other Ambulatory Visit: Payer: Self-pay

## 2021-07-02 VITALS — BP 144/94 | HR 60

## 2021-07-02 DIAGNOSIS — Z0130 Encounter for examination of blood pressure without abnormal findings: Secondary | ICD-10-CM | POA: Diagnosis not present

## 2021-07-02 DIAGNOSIS — N898 Other specified noninflammatory disorders of vagina: Secondary | ICD-10-CM | POA: Diagnosis not present

## 2021-07-02 DIAGNOSIS — Z79899 Other long term (current) drug therapy: Secondary | ICD-10-CM

## 2021-07-02 NOTE — Progress Notes (Signed)
Pt came in for bp check and labs. Has not taking Bp medication this morning. Bp was elevated. Patient informed to take bp medication before coming in for a visit. Losartan changed to 100 mg daily.

## 2021-07-03 ENCOUNTER — Telehealth: Payer: Self-pay | Admitting: Internal Medicine

## 2021-07-03 LAB — COMPREHENSIVE METABOLIC PANEL
ALT: 72 IU/L — ABNORMAL HIGH (ref 0–32)
AST: 44 IU/L — ABNORMAL HIGH (ref 0–40)
Albumin/Globulin Ratio: 1.7 (ref 1.2–2.2)
Albumin: 4.4 g/dL (ref 3.8–4.9)
Alkaline Phosphatase: 81 IU/L (ref 44–121)
BUN/Creatinine Ratio: 24 — ABNORMAL HIGH (ref 9–23)
BUN: 21 mg/dL (ref 6–24)
Bilirubin Total: 0.3 mg/dL (ref 0.0–1.2)
CO2: 22 mmol/L (ref 20–29)
Calcium: 9.6 mg/dL (ref 8.7–10.2)
Chloride: 102 mmol/L (ref 96–106)
Creatinine, Ser: 0.86 mg/dL (ref 0.57–1.00)
Globulin, Total: 2.6 g/dL (ref 1.5–4.5)
Glucose: 95 mg/dL (ref 70–99)
Potassium: 4.3 mmol/L (ref 3.5–5.2)
Sodium: 144 mmol/L (ref 134–144)
Total Protein: 7 g/dL (ref 6.0–8.5)
eGFR: 80 mL/min/{1.73_m2} (ref >59)

## 2021-07-03 MED ORDER — LOSARTAN POTASSIUM 100 MG PO TABS: ORAL_TABLET | ORAL | 11 refills | Status: DC

## 2021-07-03 MED ORDER — METFORMIN HCL 1000 MG PO TABS: ORAL_TABLET | ORAL | 11 refills | Status: DC

## 2021-07-03 NOTE — Telephone Encounter (Signed)
Discussed med changes with Silva Bandy.

## 2021-07-04 LAB — GC/CHLAMYDIA PROBE AMP
Chlamydia trachomatis, NAA: POSITIVE — AB
Neisseria Gonorrhoeae by PCR: NEGATIVE

## 2021-07-24 ENCOUNTER — Telehealth: Payer: Self-pay | Admitting: Internal Medicine

## 2021-07-26 ENCOUNTER — Other Ambulatory Visit: Payer: Self-pay

## 2021-07-26 MED ORDER — DOXYCYCLINE HYCLATE 100 MG PO TABS: 100.0000 mg | ORAL_TABLET | Freq: Two times a day (BID) | ORAL | 0 refills | Status: DC

## 2021-08-01 ENCOUNTER — Telehealth: Payer: Self-pay

## 2021-08-01 DIAGNOSIS — E118 Type 2 diabetes mellitus with unspecified complications: Secondary | ICD-10-CM

## 2021-08-01 DIAGNOSIS — I10 Essential (primary) hypertension: Secondary | ICD-10-CM

## 2021-08-01 NOTE — Telephone Encounter (Signed)
Patient called asking for a prescription sent for a glucometer and bp cuff

## 2021-08-02 MED ORDER — ACCU-CHEK SOFTCLIX LANCETS MISC: 12 refills | Status: DC

## 2021-08-02 MED ORDER — BLOOD PRESSURE KIT DEVI: 0 refills | Status: DC

## 2021-08-02 MED ORDER — ACCU-CHEK AVIVA PLUS VI STRP: ORAL_STRIP | 12 refills | Status: DC

## 2021-08-02 MED ORDER — ACCU-CHEK AVIVA PLUS W/DEVICE KIT: PACK | 0 refills | Status: DC

## 2021-08-02 NOTE — Telephone Encounter (Signed)
Sent!

## 2021-08-15 ENCOUNTER — Other Ambulatory Visit: Payer: Self-pay

## 2021-08-15 ENCOUNTER — Other Ambulatory Visit (INDEPENDENT_AMBULATORY_CARE_PROVIDER_SITE_OTHER): Payer: Medicaid Other

## 2021-08-15 DIAGNOSIS — Z794 Long term (current) use of insulin: Secondary | ICD-10-CM | POA: Diagnosis not present

## 2021-08-15 DIAGNOSIS — E785 Hyperlipidemia, unspecified: Secondary | ICD-10-CM | POA: Diagnosis not present

## 2021-08-15 DIAGNOSIS — E118 Type 2 diabetes mellitus with unspecified complications: Secondary | ICD-10-CM

## 2021-08-16 LAB — LIPID PANEL W/O CHOL/HDL RATIO
Cholesterol, Total: 145 mg/dL (ref 100–199)
HDL: 65 mg/dL (ref >39)
LDL Chol Calc (NIH): 64 mg/dL (ref 0–99)
Triglycerides: 87 mg/dL (ref 0–149)
VLDL Cholesterol Cal: 16 mg/dL (ref 5–40)

## 2021-08-16 LAB — HEMOGLOBIN A1C
Est. average glucose Bld gHb Est-mCnc: 123 mg/dL
Hgb A1c MFr Bld: 5.9 % — ABNORMAL HIGH (ref 4.8–5.6)

## 2021-08-19 ENCOUNTER — Other Ambulatory Visit: Payer: Self-pay | Admitting: Internal Medicine

## 2021-08-21 ENCOUNTER — Telehealth: Payer: Self-pay | Admitting: Internal Medicine

## 2021-08-21 NOTE — Telephone Encounter (Signed)
Pt called asking for an orthopedic referral. Pt reported having knee pain for the past 2-3 months.  ?

## 2021-08-23 ENCOUNTER — Encounter (HOSPITAL_COMMUNITY): Payer: Self-pay | Admitting: Emergency Medicine

## 2021-08-23 ENCOUNTER — Emergency Department (HOSPITAL_COMMUNITY)
Admission: EM | Admit: 2021-08-23 | Discharge: 2021-08-23 | Disposition: A | Discharge status: Home / Self Care | Payer: Medicaid Other | Attending: Emergency Medicine | Admitting: Emergency Medicine

## 2021-08-23 ENCOUNTER — Emergency Department (HOSPITAL_BASED_OUTPATIENT_CLINIC_OR_DEPARTMENT_OTHER)
Admit: 2021-08-23 | Discharge: 2021-08-23 | Disposition: A | Discharge status: Home / Self Care | Payer: Medicaid Other | Attending: Emergency Medicine | Admitting: Emergency Medicine

## 2021-08-23 ENCOUNTER — Emergency Department (HOSPITAL_COMMUNITY): Payer: Medicaid Other

## 2021-08-23 ENCOUNTER — Encounter (HOSPITAL_COMMUNITY): Payer: Self-pay

## 2021-08-23 ENCOUNTER — Ambulatory Visit (HOSPITAL_COMMUNITY)
Admission: EM | Admit: 2021-08-23 | Discharge: 2021-08-23 | Disposition: A | Discharge status: Home / Self Care | Payer: Medicaid Other

## 2021-08-23 ENCOUNTER — Other Ambulatory Visit: Payer: Self-pay

## 2021-08-23 DIAGNOSIS — G8929 Other chronic pain: Secondary | ICD-10-CM | POA: Diagnosis not present

## 2021-08-23 DIAGNOSIS — M79662 Pain in left lower leg: Secondary | ICD-10-CM | POA: Diagnosis not present

## 2021-08-23 DIAGNOSIS — M25562 Pain in left knee: Secondary | ICD-10-CM

## 2021-08-23 DIAGNOSIS — R1110 Vomiting, unspecified: Secondary | ICD-10-CM | POA: Diagnosis not present

## 2021-08-23 DIAGNOSIS — M79605 Pain in left leg: Secondary | ICD-10-CM

## 2021-08-23 DIAGNOSIS — E119 Type 2 diabetes mellitus without complications: Secondary | ICD-10-CM | POA: Insufficient documentation

## 2021-08-23 DIAGNOSIS — Z7984 Long term (current) use of oral hypoglycemic drugs: Secondary | ICD-10-CM | POA: Insufficient documentation

## 2021-08-23 DIAGNOSIS — M1710 Unilateral primary osteoarthritis, unspecified knee: Secondary | ICD-10-CM

## 2021-08-23 DIAGNOSIS — M25462 Effusion, left knee: Secondary | ICD-10-CM

## 2021-08-23 MED ORDER — IBUPROFEN 800 MG PO TABS: 800.0000 mg | ORAL_TABLET | Freq: Three times a day (TID) | ORAL | 0 refills | Status: DC

## 2021-08-23 MED ORDER — IBUPROFEN 800 MG PO TABS
800.0000 mg | ORAL_TABLET | Freq: Once | ORAL | Status: AC
  Administered 2021-08-23: 800 mg via ORAL
  Filled 2021-08-23: qty 1

## 2021-08-23 NOTE — ED Provider Notes (Signed)
[facility]    CSN: [id number] Arrival date & time: 08/23/21  1435      History   Chief Complaint Chief Complaint  Patient presents with   Knee Pain    HPI Maureen Swanson is a 56 y.o. female.   Patient presents with sister for left knee pain that has acutely worsened since she fell on it about 10 days ago.  She also reports pain in her left calf intermittently and swelling, warmth, and redness.  Also reports increased shortness of breath, however is unable to tell me how long this has been going on for.  Additionally, she reports food regurgitation for the past few weeks.  She reports the food that comes "back up" is undigested.  This happens right after she eats.  She thinks she has been told she has had a hiatal hernia in the past.  She denies dysphagia, painful swallowing, vomiting blood, blood in her stool, fevers, nausea, and vomiting.     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 x 12 yrs.   Substance abuse ([place])    crack cocaine:  extensive treatment with [facility] from 2014 to 2018 for this and alcohol abuse.   TMJ (temporomandibular joint disorder)     Patient Active Problem List   Diagnosis Date Noted   Acute gout of left foot 12/06/2019   CHF (congestive heart failure) ([place]) 08/03/2016   Hypomagnesemia    Cannabis use disorder, moderate, dependence ([place]) 11/20/2015   Homicidal ideation    Tobacco use disorder 07/20/2015   Diabetes mellitus ([place]) 07/20/2015   Cocaine use disorder, moderate,  dependence ([place]) 04/03/2015   Schizoaffective disorder, bipolar type ([place]) 04/03/2015   Alcohol use disorder, moderate, dependence ([place]) 04/03/2015   Acute ischemic stroke ([place]) 12/08/2014   Left-sided weakness 12/08/2014   Gout attack 11/12/2014   Unilateral primary osteoarthritis, right knee 11/08/2014   Diabetic peripheral neuropathy ([place]) 11/04/2014   Mixed hyperlipidemia 11/04/2014   Small vessel disease, cerebrovascular 11/04/2014   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   Cocaine abuse ([place]) 02/19/2012   Knee pain 10/17/2010   MAMMOGRAM, ABNORMAL, RIGHT 08/05/2010   Dyslipidemia 06/21/2010   AMENORRHEA, SECONDARY 10/29/2009   HERPES ZOSTER 08/20/2009   GERD 10/11/2007   Essential hypertension 02/26/2007    Past Surgical History:  Procedure Laterality Date   BREAST LUMPECTOMY WITH NEEDLE LOCALIZATION  04/19/2012   Procedure: BREAST LUMPECTOMY WITH NEEDLE LOCALIZATION;  Surgeon: [name], MD;  Location: [facility];  Service: General;  Laterality: Right;   KNEE ARTHROSCOPY W/ PARTIAL MEDIAL MENISCECTOMY  05/01/2010   right    OB History     Gravida  3   Para  2   Term  2   Preterm      AB  1  Living  2      SAB  1   IAB      Ectopic      Multiple      Live Births               Home Medications    Prior to Admission medications   Medication Sig Start Date End Date Taking? Authorizing Provider  ABILIFY MAINTENA 400 MG PRSY prefilled syringe Inject 400 mg into the muscle every 30 (thirty) days. Last injection given on 01/10/2021. Next injection due 02/07/2021 Patient taking differently: Inject 400 mg into the muscle every 30 (thirty) days. 01/09/21   Ethelene Hal, NP  Accu-Chek Softclix Lancets lancets Check blood glucose twice daily before meals 08/02/21   Mack Hook, MD  albuterol (VENTOLIN HFA) 108 (90 Base) MCG/ACT inhaler Inhale 1 puff  into the lungs every 4 (four) hours as needed for wheezing or shortness of breath. Patient not taking: Reported on 06/14/2021 01/09/21   Ethelene Hal, NP  albuterol (VENTOLIN HFA) 108 (90 Base) MCG/ACT inhaler Inhale 2 puffs into the lungs every 6 (six) hours as needed for wheezing or shortness of breath. Patient not taking: Reported on 06/14/2021 06/06/21   Mack Hook, MD  amLODipine (NORVASC) 5 MG tablet Take 2 tablets (10 mg total) by mouth daily. 06/23/21   Gareth Morgan, MD  atenolol (TENORMIN) 100 MG tablet Take 100 mg by mouth daily. 05/14/21   [provider]  atorvastatin (LIPITOR) 40 MG tablet Take 1 tablet (40 mg total) by mouth at bedtime. 01/09/21   Ethelene Hal, NP  benztropine (COGENTIN) 1 MG tablet Take 1 tablet (1 mg total) by mouth at bedtime. Patient not taking: Reported on 06/14/2021 01/09/21   Ethelene Hal, NP  Blood Glucose Monitoring Suppl (ACCU-CHEK AVIVA PLUS) w/Device KIT Check blood glucose twice daily before meals 08/02/21   Mack Hook, MD  Blood Pressure Monitoring (BLOOD PRESSURE KIT) DEVI Check blood pressure 3 times weekly 08/02/21   Mack Hook, MD  cetirizine (ZYRTEC) 10 MG tablet Take 10 mg by mouth daily as needed for allergies. 05/14/21   [provider]  cyclobenzaprine (FLEXERIL) 10 MG tablet Take 10 mg by mouth daily as needed for muscle spasms. Patient not taking: Reported on 06/14/2021 05/14/21   [provider]  diclofenac Sodium (VOLTAREN) 1 % GEL Apply 2g to knees up to 4 times daily as needed for pain 08/19/21   Mack Hook, MD  doxycycline (VIBRA-TABS) 100 MG tablet Take 1 tablet (100 mg total) by mouth 2 (two) times daily. 07/26/21   Mack Hook, MD  gabapentin (NEURONTIN) 300 MG capsule Take 1 capsule (300 mg total) by mouth 3 (three) times daily. 01/09/21   Ethelene Hal, NP  glipiZIDE (GLUCOTROL) 10 MG tablet Take 10 mg by mouth daily. Patient not  taking: Reported on 06/14/2021 05/14/21   [provider]  glucose blood (ACCU-CHEK AVIVA PLUS) test strip Check blood glucose twice daily before meals 08/02/21   Mack Hook, MD  hydrochlorothiazide (HYDRODIURIL) 12.5 MG tablet Take 2 tablets (25 mg total) by mouth daily. 06/23/21 07/23/21  Gareth Morgan, MD  lamoTRIgine (LAMICTAL) 100 MG tablet Take 100 mg by mouth daily. 05/14/21   [provider]  losartan (COZAAR) 100 MG tablet 1 tab by mouth with HCTZ in morning daily. 07/03/21   Mack Hook, MD  melatonin 3 MG TABS tablet Take 1 tablet (3 mg total) by mouth at bedtime. 01/09/21   Ethelene Hal,  NP  meloxicam (MOBIC) 15 MG tablet Take 1 tablet (15 mg total) by mouth daily. 01/10/21   Ethelene Hal, NP  metFORMIN (GLUCOPHAGE) 1000 MG tablet 1/2 tab by mouth twice daily with meals 07/03/21   Mack Hook, MD  metoCLOPramide (REGLAN) 10 MG tablet Take 10 mg by mouth daily as needed for nausea or vomiting. Patient not taking: Reported on 06/14/2021 05/14/21   [provider]  omeprazole (PRILOSEC) 20 MG capsule Take 20 mg by mouth daily. 05/14/21   [provider]  prazosin (MINIPRESS) 1 MG capsule Take 1 mg by mouth at bedtime. Patient not taking: Reported on 06/14/2021 05/14/21   [provider]  traZODone (DESYREL) 150 MG tablet Take 150-300 mg by mouth at bedtime as needed for sleep. 05/16/21   [provider]    Family History Family History  Problem Relation Age of Onset   Diabetes Mother    Breast cancer Mother    Cancer Father    Bipolar disorder Maternal Aunt    Schizophrenia Maternal Grandfather    Alcoholism Maternal Uncle     Social History Social History   Tobacco Use   Smoking status: Former    Packs/day: 1.00    Years: 0.00    Pack years: 0.00    Types: Cigarettes   Smokeless tobacco: Never  Vaping Use   Vaping Use: Never used  Substance Use Topics   Alcohol use: Not  Currently    Comment: 3x 40 oz beers/day   Drug use: Not Currently    Frequency: 5.0 times per week    Types: Cocaine, "Crack" cocaine     Allergies   Chocolate, Orange, Penicillins, Strawberry extract, and Tomato   Review of Systems Review of Systems Per HPI  Physical Exam Triage Vital Signs ED Triage Vitals [08/23/21 1508]  Enc Vitals Group     BP (!) 137/92     Pulse Rate 63     Resp 18     Temp 98.1 F (36.7 C)     Temp Source Oral     SpO2 98 %     Weight      Height      Head Circumference      Peak Flow      Pain Score 8     Pain Loc      Pain Edu?      Excl. in [place]?    No data found.  Updated Vital Signs BP (!) 137/92 (BP Location: Left Arm)    Pulse 63    Temp 98.1 F (36.7 C) (Oral)    Resp 18    SpO2 98%   Visual Acuity Right Eye Distance:   Left Eye Distance:   Bilateral Distance:    Right Eye Near:   Left Eye Near:    Bilateral Near:     Physical Exam Vitals and nursing note reviewed.  Constitutional:      General: She is not in acute distress.    Appearance: Normal appearance. She is obese. She is not toxic-appearing.  Abdominal:     General: Abdomen is flat.     Palpations: Abdomen is soft.  Musculoskeletal:     Right knee: Normal.     Left knee: Swelling present. Normal range of motion. Tenderness present over the medial joint line and lateral joint line. Normal pulse.     Left lower leg: Edema present.     Comments: Mild warmth to left lower extremity, Homan's sign +  Skin:    General: Skin is warm and dry.     Capillary Refill: Capillary refill takes less than 2 seconds.     Coloration: Skin is not jaundiced or pale.     Findings: No erythema.  Neurological:     Mental Status: She is alert and oriented to person, place, and time.     Motor: No weakness.     Gait: Gait normal.  Psychiatric:        Mood and Affect: Mood normal.        Behavior: Behavior normal.     UC Treatments / Results  Labs (all labs ordered are  listed, but only abnormal results are displayed) Labs Reviewed - No data to display  EKG   Radiology No results found.  Procedures Procedures (including critical care time)  Medications Ordered in UC Medications - No data to display  Initial Impression / Assessment and Plan / UC Course  I have reviewed the triage vital signs and the nursing notes.  Pertinent labs & imaging results that were available during my care of the patient were reviewed by me and considered in my medical decision making (see chart for details).    Upon examination of left knee and lower leg, patient had pain out of proportion to examination findings in lower calf and leg.  Given recent fall, increased shortness of breath, calf tenderness, and positive Homan's sign, will send to ER for further work up and evaluation and to rule out DVT.  Patient is agreeable to plan and is stable to transport to ED via private vehicle.   For food regurgitation, she can temporarily increase omeprazole to twice daily dosing and follow up with GI.  No red flags today. Final Clinical Impressions(s) / UC Diagnoses   Final diagnoses:  Chronic pain of left knee  Pain of left calf  Regurgitation of food     Discharge Instructions      Please immediately go to the Emergency Room to have the pain in your left leg evaluated.  Please increase your omeprazole to 20 mg twice daily and call the GI clinic number we have provided.       ED Prescriptions   None    PDMP not reviewed this encounter.   Eulogio Bear, NP 08/23/21 1531

## 2021-08-23 NOTE — Discharge Instructions (Signed)
Please immediately go to the Emergency Room to have the pain in your left leg evaluated.  Please increase your omeprazole to 20 mg twice daily and call the GI clinic number we have provided.   ?

## 2021-08-23 NOTE — Progress Notes (Signed)
Left lower extremity venous duplex has been completed. ?Preliminary results can be found in CV Proc through chart review.  ?Results were given to Dr. Darl Householder. ? ?08/23/21 5:20 PM ?Maureen Swanson RVT   ?

## 2021-08-23 NOTE — Discharge Instructions (Addendum)
You have knee arthritis. ? ?Please keep knee elevated and you can wrap it with Ace wrap to help with swelling. ? ?Take Motrin for pain ? ?You should follow-up with a orthopedic doctor and they may consider doing a arthrocentesis and inject some steroid into the joint ? ?Return to ER if you have worse knee pain, knee swelling, fever, trouble walking ?

## 2021-08-23 NOTE — ED Triage Notes (Signed)
Pt states hx of chronic knee and fell 2wks ago on lt knee, c/o increase pain. Using diclofenac cream with no relief. ? ?Pt c/o upper abdomen/epigastric pain after eating, states food coming back up x 1.5 months. ?

## 2021-08-23 NOTE — ED Provider Notes (Signed)
?[facility] ?Provider Note ? ? ?CSN: [id number] ?Arrival date & time: 08/23/21  1545 ? ?  ? ?History ? ?Chief Complaint  ?Patient presents with  ? Knee Pain  ? ? ?Maureen Swanson is a 56 y.o. female here with knee pain.  Patient states that she has been having left knee pain for the last several months.  Patient states that she has history of arthritis.  Patient had a fall and has worsening left knee pain.  Patient was noted to have left knee swelling and went to urgent care was sent in for rule out DVT.  Denies any chest pain or shortness of breath ? ?The history is provided by the patient.  ? ?  ? ?Home Medications ?Prior to Admission medications   ?Medication Sig Start Date End Date Taking? Authorizing Provider  ?ABILIFY MAINTENA 400 MG PRSY prefilled syringe Inject 400 mg into the muscle every 30 (thirty) days. Last injection given on 01/10/2021. Next injection due 02/07/2021 ?Patient taking differently: Inject 400 mg into the muscle every 30 (thirty) days. 01/09/21   Ethelene Hal, NP  ?Accu-Chek Softclix Lancets lancets Check blood glucose twice daily before meals 08/02/21   Mack Hook, MD  ?albuterol (VENTOLIN HFA) 108 (90 Base) MCG/ACT inhaler Inhale 1 puff into the lungs every 4 (four) hours as needed for wheezing or shortness of breath. ?Patient not taking: Reported on 06/14/2021 01/09/21   Ethelene Hal, NP  ?albuterol (VENTOLIN HFA) 108 (90 Base) MCG/ACT inhaler Inhale 2 puffs into the lungs every 6 (six) hours as needed for wheezing or shortness of breath. ?Patient not taking: Reported on 06/14/2021 06/06/21   Mack Hook, MD  ?amLODipine (NORVASC) 5 MG tablet Take 2 tablets (10 mg total) by mouth daily. 06/23/21   Gareth Morgan, MD  ?atenolol (TENORMIN) 100 MG tablet Take 100 mg by mouth daily. 05/14/21   [provider]  ?atorvastatin (LIPITOR) 40 MG tablet Take 1 tablet (40 mg total) by mouth at bedtime. 01/09/21   Ethelene Hal, NP  ?benztropine (COGENTIN) 1 MG tablet Take 1 tablet (1 mg total) by mouth at bedtime. ?Patient not taking: Reported on 06/14/2021 01/09/21   Ethelene Hal, NP  ?Blood Glucose Monitoring Suppl (ACCU-CHEK AVIVA PLUS) w/Device KIT Check blood glucose twice daily before meals 08/02/21   Mack Hook, MD  ?Blood Pressure Monitoring (BLOOD PRESSURE KIT) DEVI Check blood pressure 3 times weekly 08/02/21   Mack Hook, MD  ?cetirizine (ZYRTEC) 10 MG tablet Take 10 mg by mouth daily as needed for allergies. 05/14/21   [provider]  ?cyclobenzaprine (FLEXERIL) 10 MG tablet Take 10 mg by mouth daily as needed for muscle spasms. ?Patient not taking: Reported on 06/14/2021 05/14/21   [provider]  ?diclofenac Sodium (VOLTAREN) 1 % GEL Apply 2g to knees up to 4 times daily as needed for pain 08/19/21   Mack Hook, MD  ?doxycycline (VIBRA-TABS) 100 MG tablet Take 1 tablet (100 mg total) by mouth 2 (two) times daily. 07/26/21   Mack Hook, MD  ?gabapentin (NEURONTIN) 300 MG capsule Take 1 capsule (300 mg total) by mouth 3 (three) times daily. 01/09/21   Ethelene Hal, NP  ?glipiZIDE (GLUCOTROL) 10 MG tablet Take 10 mg by mouth daily. ?Patient not taking: Reported on 06/14/2021 05/14/21   [provider]  ?glucose blood (ACCU-CHEK AVIVA PLUS) test strip Check blood glucose twice daily before meals 08/02/21   Mack Hook, MD  ?hydrochlorothiazide (HYDRODIURIL) 12.5  MG tablet Take 2 tablets (25 mg total) by mouth daily. 06/23/21 07/23/21  Gareth Morgan, MD  ?lamoTRIgine (LAMICTAL) 100 MG tablet Take 100 mg by mouth daily. 05/14/21   [provider]  ?losartan (COZAAR) 100 MG tablet 1 tab by mouth with HCTZ in morning daily. 07/03/21   Mack Hook, MD  ?melatonin 3 MG TABS tablet Take 1 tablet (3 mg total) by mouth at bedtime. 01/09/21   Ethelene Hal, NP  ?meloxicam (MOBIC) 15 MG tablet Take 1 tablet (15 mg total) by  mouth daily. 01/10/21   Ethelene Hal, NP  ?metFORMIN (GLUCOPHAGE) 1000 MG tablet 1/2 tab by mouth twice daily with meals 07/03/21   Mack Hook, MD  ?metoCLOPramide (REGLAN) 10 MG tablet Take 10 mg by mouth daily as needed for nausea or vomiting. ?Patient not taking: Reported on 06/14/2021 05/14/21   [provider]  ?omeprazole (PRILOSEC) 20 MG capsule Take 20 mg by mouth daily. 05/14/21   [provider]  ?prazosin (MINIPRESS) 1 MG capsule Take 1 mg by mouth at bedtime. ?Patient not taking: Reported on 06/14/2021 05/14/21   [provider]  ?traZODone (DESYREL) 150 MG tablet Take 150-300 mg by mouth at bedtime as needed for sleep. 05/16/21   [provider]  ?   ? ?Allergies    ?Chocolate, Orange, Penicillins, Strawberry extract, and Tomato   ? ?Review of Systems   ?Review of Systems  ?Cardiovascular:  Positive for leg swelling.  ?Musculoskeletal:   ?     L knee pain   ?All other systems reviewed and are negative. ? ?Physical Exam ?Updated Vital Signs ?BP 132/82   Pulse 61   Temp 98.9 ?F (37.2 ?C) (Oral)   Resp 16   SpO2 98%  ?Physical Exam ?Vitals and nursing note reviewed.  ?Constitutional:   ?   Appearance: Normal appearance.  ?HENT:  ?   Head: Normocephalic.  ?   Mouth/Throat:  ?   Mouth: Mucous membranes are moist.  ?Eyes:  ?   Extraocular Movements: Extraocular movements intact.  ?   Pupils: Pupils are equal, round, and reactive to light.  ?Cardiovascular:  ?   Rate and Rhythm: Normal rate and regular rhythm.  ?Pulmonary:  ?   Effort: Pulmonary effort is normal.  ?   Breath sounds: Normal breath sounds.  ?Abdominal:  ?   General: Abdomen is flat.  ?   Palpations: Abdomen is soft.  ?Musculoskeletal:  ?   Cervical back: Normal range of motion and neck supple.  ?   Comments: Left knee with small suprapatella effusion.  There is no obvious cellulitis.  Patient does have left calf tenderness  ?Skin: ?   General: Skin is warm.  ?Neurological:  ?   General: No  focal deficit present.  ?   Mental Status: She is alert and oriented to person, place, and time.  ?Psychiatric:     ?   Mood and Affect: Mood normal.     ?   Behavior: Behavior normal.  ? ? ?ED Results / Procedures / Treatments   ?Labs ?(all labs ordered are listed, but only abnormal results are displayed) ?Labs Reviewed - No data to display ? ?EKG ?None ? ?Radiology ?No results found. ? ?Procedures ?Procedures  ? ? ?Medications Ordered in ED ?Medications  ?ibuprofen (ADVIL) tablet 800 mg (has no administration in time range)  ? ? ?ED Course/ Medical Decision Making/ A&P ?  ?                        ?  Medical Decision Making ?Maureen Swanson is a 56 y.o. female here presenting with left knee pain and swelling.  Likely pain from arthritis.  Patient does have suprapatellar effusion.  There is no sign of cellulitis or concern for septic joint.  We will get x-ray and DVT study ? ?5:55 PM ?X-ray showed arthritis.  DVT study is negative.  I think she likely has arthritis with small effusion.  Will refer to orthopedic doctor outpatient.  Patient is diabetic and would like to hold off on steroids.  We will try NSAIDs. ? ?Problems Addressed: ?Arthritis of knee: acute illness or injury ?Effusion of left knee: acute illness or injury ? ?Amount and/or Complexity of Data Reviewed ?Radiology: ordered and independent interpretation performed. Decision-making details documented in ED Course. ? ?Risk ?Prescription drug management. ? ? ?Final Clinical Impression(s) / ED Diagnoses ?Final diagnoses:  ?None  ? ? ?Rx / DC Orders ?ED Discharge Orders   ? ? None  ? ?  ? ? ?  ?Drenda Freeze, MD ?08/23/21 1757 ? ?

## 2021-08-23 NOTE — ED Triage Notes (Signed)
Patient coming from home, complaint of left knee pain x2 months, states she fell on it two weeks ago and has increased pain.  ?

## 2021-09-03 ENCOUNTER — Ambulatory Visit (INDEPENDENT_AMBULATORY_CARE_PROVIDER_SITE_OTHER): Payer: Medicaid Other | Admitting: Internal Medicine

## 2021-09-03 ENCOUNTER — Encounter: Payer: Self-pay | Admitting: Internal Medicine

## 2021-09-03 ENCOUNTER — Other Ambulatory Visit: Payer: Self-pay

## 2021-09-03 VITALS — BP 150/94 | HR 60 | Resp 20 | Ht 64.0 in | Wt 218.0 lb

## 2021-09-03 DIAGNOSIS — R400 Somnolence: Secondary | ICD-10-CM

## 2021-09-03 DIAGNOSIS — G4733 Obstructive sleep apnea (adult) (pediatric): Secondary | ICD-10-CM

## 2021-09-03 DIAGNOSIS — R1319 Other dysphagia: Secondary | ICD-10-CM

## 2021-09-03 DIAGNOSIS — R0683 Snoring: Secondary | ICD-10-CM

## 2021-09-03 NOTE — Progress Notes (Addendum)
Subjective:    Patient ID: Maureen Swanson, female   DOB: 06-03-1966, 56 y.o.   MRN: [id number]   HPI   Poor Sleep: awakens every 1-2 hours and cannot get back to sleep.  Awakens short of breath.  Her roommate at [facility] has said she snores.  She cannot say whether her roommate has noted her to stop breathing.  She is fatigued in the morning.  Falls asleep easily during the day.   Appears she had a sleep study at [facility] 02/2015, but unable to pull up the whole report.  Appears she was diagnosed with OSA and recommended for CPAP with a pressure of 14 cm H2O, but also appears report was never finalized.  After looking at another note from 09/29/2017, patient states she could not afford the apparatus at the time.  New sleep study ordered 09/2017, but does not appear she followed through with the study.  2.  Food feels like getting stuck in epigastrium and then feels like coming up into throat.  Sometimes with acid taste in throat.  Short winded when awakens in morning.  Has the sense of food getting stuck with every meal--lasts 10-15 minutes.  Symptoms for 1.5 months.  Patient recently started on Ibuprofen after being seen by ortho for her knee.  Discussed she is also taking Meloxicam.  Her Meloxicam is filled separately in her pill packs and she only uses as needed.  States not taking since using ibuprofen.  She does not have much of ibuprofen left.  Taking Omeprazole with all her morning meds  Current Meds  Medication Sig   ABILIFY MAINTENA 400 MG PRSY prefilled syringe Inject 400 mg into the muscle every 30 (thirty) days. Last injection given on 01/10/2021. Next injection due 02/07/2021 (Patient taking differently: Inject 400 mg into the muscle every 30 (thirty) days.)   Accu-Chek Softclix Lancets lancets Check blood glucose twice daily before meals   albuterol (VENTOLIN HFA) 108 (90 Base) MCG/ACT inhaler Inhale 2 puffs into the lungs every 6 (six) hours as needed for wheezing or shortness of  breath.   amLODipine (NORVASC) 5 MG tablet Take 2 tablets (10 mg total) by mouth daily.   atenolol (TENORMIN) 100 MG tablet Take 100 mg by mouth daily.   atorvastatin (LIPITOR) 40 MG tablet Take 1 tablet (40 mg total) by mouth at bedtime.   benztropine (COGENTIN) 1 MG tablet Take 1 tablet (1 mg total) by mouth at bedtime.   Blood Glucose Monitoring Suppl (ACCU-CHEK AVIVA PLUS) w/Device KIT Check blood glucose twice daily before meals   Blood Pressure Monitoring (BLOOD PRESSURE KIT) DEVI Check blood pressure 3 times weekly   cetirizine (ZYRTEC) 10 MG tablet Take 10 mg by mouth daily as needed for allergies.   cyclobenzaprine (FLEXERIL) 10 MG tablet Take 10 mg by mouth daily as needed for muscle spasms.   diclofenac Sodium (VOLTAREN) 1 % GEL Apply 2g to knees up to 4 times daily as needed for pain   gabapentin (NEURONTIN) 300 MG capsule Take 1 capsule (300 mg total) by mouth 3 (three) times daily.   glucose blood (ACCU-CHEK AVIVA PLUS) test strip Check blood glucose twice daily before meals   hydrochlorothiazide (HYDRODIURIL) 12.5 MG tablet Take 2 tablets (25 mg total) by mouth daily.   ibuprofen (ADVIL) 800 MG tablet Take 1 tablet (800 mg total) by mouth 3 (three) times daily.   lamoTRIgine (LAMICTAL) 100 MG tablet Take 100 mg by mouth daily.   melatonin 3  MG TABS tablet Take 1 tablet (3 mg total) by mouth at bedtime.   meloxicam (MOBIC) 15 MG tablet Take 1 tablet (15 mg total) by mouth daily.   metFORMIN (GLUCOPHAGE) 1000 MG tablet 1/2 tab by mouth twice daily with meals   metoCLOPramide (REGLAN) 10 MG tablet Take 10 mg by mouth daily as needed for nausea or vomiting.   omeprazole (PRILOSEC) 20 MG capsule Take 20 mg by mouth daily.   prazosin (MINIPRESS) 1 MG capsule Take 1 mg by mouth at bedtime.   traZODone (DESYREL) 150 MG tablet Take 150-300 mg by mouth at bedtime as needed for sleep.   Allergies  Allergen Reactions   Chocolate Hives   Orange Hives and Other (See Comments)    "Acid  foods"   Penicillins Hives and Other (See Comments)    Has patient had a PCN reaction causing immediate rash, facial/tongue/throat swelling, SOB or lightheadedness with hypotension: Yes Has patient had a PCN reaction causing severe rash involving mucus membranes or skin necrosis: Yes Has patient had a PCN reaction that required hospitalization No Has patient had a PCN reaction occurring within the last 10 years: No If all of the above answers are "NO", then may proceed with Cephalosporin use.    Strawberry Extract Swelling   Tomato Hives and Other (See Comments)    "acid foods"     Review of Systems     Objective:   BP (!) 150/94 (BP Location: Right Arm, Patient Position: Sitting, Cuff Size: Normal)   Pulse 60   Resp 20   Ht _0  (1.626 m)   Wt 218 lb (98.9 kg)   BMI 37.42 kg/m   Physical Exam NAD HEENT:  PERRL EOMI, difficulty seeing posterior pharynx due to excess soft tissue Neck:  Short, Supple, No adenopathy, no thyromegaly.   Chest:  CTA CV:  RRR with normal S1 and S2, No S3, S4 or murmur.  Radial and DP pulses normal and equal. Abd: S, NT, No HSM or mass, + BS LE:  No edema.   Assessment & Plan    Snoring with daytime somnolence and history of severe OSA, untreated:  Send for a new sleep study.  2.  GERD:  stop meloxicam and only use ibuprofen, which reportedly works better for her knee pain.  She is taking Omeprazole with all of her other morning meds.  She will switch to taking on empty stomach with water 1/2 hour before other meds.   Discuss reflux precautions.  Follow up in 2 weeks to see if any better.

## 2021-09-05 ENCOUNTER — Telehealth: Payer: Self-pay

## 2021-09-05 NOTE — Telephone Encounter (Signed)
Patient would like refill on doxycyline. Patient reported that she is still experiencing itching, smell and has noticed some bumps. Previous rx helped some.  ?

## 2021-09-18 ENCOUNTER — Encounter: Payer: Self-pay | Admitting: Internal Medicine

## 2021-09-18 ENCOUNTER — Ambulatory Visit: Payer: Medicaid Other | Admitting: Internal Medicine

## 2021-09-18 ENCOUNTER — Other Ambulatory Visit: Payer: Self-pay | Admitting: Internal Medicine

## 2021-09-18 VITALS — BP 168/114 | HR 64 | Resp 16 | Ht 64.0 in | Wt 242.0 lb

## 2021-09-18 DIAGNOSIS — Z1231 Encounter for screening mammogram for malignant neoplasm of breast: Secondary | ICD-10-CM

## 2021-09-18 DIAGNOSIS — N761 Subacute and chronic vaginitis: Secondary | ICD-10-CM

## 2021-09-18 DIAGNOSIS — E118 Type 2 diabetes mellitus with unspecified complications: Secondary | ICD-10-CM

## 2021-09-18 DIAGNOSIS — Z23 Encounter for immunization: Secondary | ICD-10-CM

## 2021-09-18 DIAGNOSIS — G4733 Obstructive sleep apnea (adult) (pediatric): Secondary | ICD-10-CM

## 2021-09-18 DIAGNOSIS — Z1211 Encounter for screening for malignant neoplasm of colon: Secondary | ICD-10-CM

## 2021-09-18 DIAGNOSIS — R1319 Other dysphagia: Secondary | ICD-10-CM | POA: Diagnosis not present

## 2021-09-18 DIAGNOSIS — Z794 Long term (current) use of insulin: Secondary | ICD-10-CM

## 2021-09-18 DIAGNOSIS — Z124 Encounter for screening for malignant neoplasm of cervix: Secondary | ICD-10-CM

## 2021-09-18 DIAGNOSIS — I10 Essential (primary) hypertension: Secondary | ICD-10-CM

## 2021-09-18 LAB — POCT WET PREP WITH KOH
KOH Prep POC: NEGATIVE
RBC Wet Prep HPF POC: NEGATIVE
Trichomonas, UA: NEGATIVE
Yeast Wet Prep HPF POC: NEGATIVE

## 2021-09-18 MED ORDER — HYDROCHLOROTHIAZIDE 25 MG PO TABS: ORAL_TABLET | ORAL | 11 refills | Status: DC

## 2021-09-18 MED ORDER — METFORMIN HCL 1000 MG PO TABS: ORAL_TABLET | ORAL | 11 refills | Status: DC

## 2021-09-18 MED ORDER — OMEPRAZOLE 40 MG PO CPDR: DELAYED_RELEASE_CAPSULE | ORAL | 11 refills | Status: DC

## 2021-09-18 MED ORDER — CYCLOBENZAPRINE HCL 10 MG PO TABS: ORAL_TABLET | ORAL | 6 refills | Status: DC

## 2021-09-18 MED ORDER — METOCLOPRAMIDE HCL 10 MG PO TABS: ORAL_TABLET | ORAL | 11 refills | Status: DC

## 2021-09-18 MED ORDER — ATENOLOL 100 MG PO TABS
100.0000 mg | ORAL_TABLET | Freq: Every day | ORAL | 11 refills | Status: DC
Start: 2021-09-18 — End: 2022-08-29

## 2021-09-18 MED ORDER — AMLODIPINE BESYLATE 10 MG PO TABS: ORAL_TABLET | ORAL | Status: DC

## 2021-09-18 MED ORDER — IBUPROFEN 800 MG PO TABS
ORAL_TABLET | ORAL | 4 refills | Status: DC
Start: 2021-09-18 — End: 2022-02-22

## 2021-09-18 MED ORDER — CETIRIZINE HCL 10 MG PO TABS: 10.0000 mg | ORAL_TABLET | Freq: Every day | ORAL | 11 refills | Status: DC | PRN

## 2021-09-18 MED ORDER — ALBUTEROL SULFATE HFA 108 (90 BASE) MCG/ACT IN AERS: 2.0000 | INHALATION_SPRAY | Freq: Four times a day (QID) | RESPIRATORY_TRACT | 4 refills | Status: DC | PRN

## 2021-09-18 MED ORDER — GLIPIZIDE 5 MG PO TABS: ORAL_TABLET | ORAL | 11 refills | Status: DC

## 2021-09-18 MED ORDER — DOXYCYCLINE HYCLATE 100 MG PO TABS: ORAL_TABLET | ORAL | 0 refills | Status: DC

## 2021-09-18 MED ORDER — LOSARTAN POTASSIUM 100 MG PO TABS: ORAL_TABLET | ORAL | 11 refills | Status: DC

## 2021-09-18 NOTE — Progress Notes (Unsigned)
Subjective:    Patient ID: Maureen Swanson, female   DOB: Jun 11, 1966, 56 y.o.   MRN: [id number]   HPI  Lot of confusion with her meds.  Medications did not get transferred properly to pharmacy at [facility] when she left rehab.    Called and spoke with pharmacy with patient in room.     DM:  she is taking glipizide 10 mg twice daily and Metformin 1000 mg twice daily and has done so since January when left rehab.  A1C 2as 5.9% 1 month ago.  She continues to have low blood glucose in the morning.  Often skips evening meal.  Sugars fine later in day.  Had diabetic eye exam within past 2 months  2.  Dysphagia:  resolved with moving Omeprazole to before breakfast instead of with all meds and breakfast.  She still is having acid when lies down for bedtime.  Does not have HOB elevated nor does she wait after a meal to lie down.  3.  Hypertension:  very high still.  Not receiving Losartan and only taking 12.5 mg of HCTZ daily.    4.  Chlamydia vaginitis:  states she did take Doxy course, but her partner did not get treated.  She started having intercourse with her long term boyfriend 3 days after treatment and before apparently, he was treated.  Her symptoms returned after intercourse in Jan or Feb.  5.  HM:  has not had pap, mammogram.  Needs Td and Shingrix as well.  6.  OSA:  scheduled for a new sleep study end of this month.  Allergies  Allergen Reactions   Chocolate Hives   Orange Hives and Other (See Comments)    "Acid foods"   Penicillins Hives and Other (See Comments)    Has patient had a PCN reaction causing immediate rash, facial/tongue/throat swelling, SOB or lightheadedness with hypotension: Yes Has patient had a PCN reaction causing severe rash involving mucus membranes or skin necrosis: Yes Has patient had a PCN reaction that required hospitalization No Has patient had a PCN reaction occurring within the last 10 years: No If all of the above answers are "NO", then may  proceed with Cephalosporin use.    Strawberry Extract Swelling   Tomato Hives and Other (See Comments)    "acid foods"     Review of Systems    Objective:   BP (!) 168/114 (BP Location: Right Arm, Patient Position: Sitting, Cuff Size: Normal)   Pulse 64   Resp 16   Ht '5\' 4"'$  (1.626 m)   Wt 242 lb (109.8 kg)   BMI 41.54 kg/m   Physical Exam Constitutional:      Appearance: She is obese.  HENT:     Head: Normocephalic and atraumatic.     Right Ear: Tympanic membrane, ear canal and external ear normal.     Left Ear: Tympanic membrane, ear canal and external ear normal.     Mouth/Throat:     Mouth: Mucous membranes are moist.     Pharynx: Oropharynx is clear.  Eyes:     Extraocular Movements: Extraocular movements intact.     Conjunctiva/sclera: Conjunctivae normal.     Pupils: Pupils are equal, round, and reactive to light.  Cardiovascular:     Rate and Rhythm: Normal rate and regular rhythm.     Pulses:          Dorsalis pedis pulses are 2+ on the right side and 2+ on the left side.  Posterior tibial pulses are 2+ on the right side and 2+ on the left side.     Heart sounds: S1 normal and S2 normal. No murmur heard.    No friction rub. No S3 or S4 sounds.     Comments: No carotid bruits.  Carotid, radial, femoral, DP and PT pulses normal and equal.    Pulmonary:     Effort: Pulmonary effort is normal.     Breath sounds: Normal breath sounds.  Abdominal:     General: Bowel sounds are normal.     Palpations: Abdomen is soft. There is no hepatomegaly, splenomegaly or mass.     Tenderness: There is no abdominal tenderness.     Hernia: No hernia is present.  Genitourinary:    Comments: Normal external female genitalia Cervix without lesion.   Scant vaginal discharge--mucousy No CMT, No uterine or adnexal mass or tenderness. Musculoskeletal:     Cervical back: Normal range of motion and neck supple.     Right lower leg: No edema.     Left lower leg: No edema.   Feet:     Right foot:     Protective Sensation: 10 sites tested.  10 sites sensed.     Skin integrity: Dry skin present.     Toenail Condition: Right toenails are abnormally thick. Fungal disease present.    Left foot:     Protective Sensation: 10 sites tested.  10 sites sensed.     Skin integrity: Dry skin present.     Toenail Condition: Left toenails are abnormally thick. Fungal disease present. Lymphadenopathy:     Head:     Right side of head: No submental or submandibular adenopathy.     Left side of head: No submental or submandibular adenopathy.     Cervical: No cervical adenopathy.     Upper Body:     Right upper body: No supraclavicular or axillary adenopathy.     Left upper body: No supraclavicular or axillary adenopathy.  Neurological:     Mental Status: She is alert.     Cranial Nerves: Cranial nerves 2-12 are intact.     Motor: Motor function is intact.     Coordination: Coordination is intact.  Psychiatric:        Attention and Perception: Attention normal.        Behavior: Behavior normal. Behavior is cooperative.     Comments: Conversation and cognition very clear compared to prior to rehab      Assessment & Plan   Recent history of Chlamydia. As she Wet prep with whiff, but no trich noted, some clue cells

## 2021-09-19 LAB — CYTOLOGY - PAP

## 2021-09-20 LAB — GC/CHLAMYDIA PROBE AMP
Chlamydia trachomatis, NAA: NEGATIVE
Neisseria Gonorrhoeae by PCR: NEGATIVE

## 2021-10-09 ENCOUNTER — Ambulatory Visit (HOSPITAL_BASED_OUTPATIENT_CLINIC_OR_DEPARTMENT_OTHER): Payer: Medicaid Other | Attending: Internal Medicine | Admitting: Internal Medicine

## 2021-10-09 ENCOUNTER — Encounter (INDEPENDENT_AMBULATORY_CARE_PROVIDER_SITE_OTHER): Payer: Self-pay

## 2021-10-09 VITALS — Ht 64.0 in | Wt 234.0 lb

## 2021-10-09 DIAGNOSIS — R0683 Snoring: Secondary | ICD-10-CM | POA: Diagnosis present

## 2021-10-09 DIAGNOSIS — G4763 Sleep related bruxism: Secondary | ICD-10-CM | POA: Insufficient documentation

## 2021-10-09 DIAGNOSIS — G4733 Obstructive sleep apnea (adult) (pediatric): Secondary | ICD-10-CM | POA: Insufficient documentation

## 2021-10-09 DIAGNOSIS — G4736 Sleep related hypoventilation in conditions classified elsewhere: Secondary | ICD-10-CM | POA: Insufficient documentation

## 2021-10-09 DIAGNOSIS — R400 Somnolence: Secondary | ICD-10-CM | POA: Diagnosis not present

## 2021-10-16 ENCOUNTER — Other Ambulatory Visit: Payer: Medicaid Other | Admitting: Internal Medicine

## 2021-10-16 ENCOUNTER — Other Ambulatory Visit: Payer: Self-pay | Admitting: Internal Medicine

## 2021-10-16 DIAGNOSIS — N6320 Unspecified lump in the left breast, unspecified quadrant: Secondary | ICD-10-CM

## 2021-10-19 DIAGNOSIS — R0683 Snoring: Secondary | ICD-10-CM

## 2021-10-19 NOTE — Procedures (Signed)
? ? ?  Patient Name: Maureen Swanson, Maureen Swanson ?Study Date: 10/09/2021 ?Gender: Female ?D.O.B: Nov 07, 1965 ?Age (years): 55 ?Referring Provider: Mack Hook ?Height (inches): 64 ?Interpreting Physician: Baird Lyons MD, ABSM ?Weight (lbs): 234 ?RPSGT: Jorge Ny ?BMI: 40 ?MRN: [id number] ?Neck Size: 15.50 ? ?CLINICAL INFORMATION ?Sleep Study Type: NPSG ?Indication for sleep study: Congestive Heart Failure, Diabetes, Hypertension, Morbid Obesity, Snoring ?Epworth Sleepiness Score:11 ? ?SLEEP STUDY TECHNIQUE ?As per the AASM Manual for the Scoring of Sleep and Associated Events v2.3 (April 2016) with a hypopnea requiring 4% desaturations. ? ?The channels recorded and monitored were frontal, central and occipital EEG, electrooculogram (EOG), submentalis EMG (chin), nasal and oral airflow, thoracic and abdominal wall motion, anterior tibialis EMG, snore microphone, electrocardiogram, and pulse oximetry. ? ?MEDICATIONS ?Medications self-administered by patient taken the night of the study : TRAZODONE ? ?SLEEP ARCHITECTURE ?The study was initiated at 9:47:24 PM and ended at 4:31:47 AM. ? ?Sleep onset time was 63.1 minutes and the sleep efficiency was 66.8%%. The total sleep time was 270 minutes. ? ?Stage REM latency was 68.0 minutes. ? ?The patient spent 13.0%% of the night in stage N1 sleep, 73.1%% in stage N2 sleep, 0.0%% in stage N3 and 13.9% in REM. ? ?Alpha intrusion was absent. ? ?Supine sleep was 18.83%. ? ?RESPIRATORY PARAMETERS ?The overall apnea/hypopnea index (AHI) was 100.7 per hour. There were 14 total apneas, including 12 obstructive, 2 central and 0 mixed apneas. There were 439 hypopneas and 4 RERAs. ? ?The AHI during Stage REM sleep was 96.0 per hour. ? ?AHI while supine was 88.5 per hour. ? ?The mean oxygen saturation was 92.6%. The minimum SpO2 during sleep was 80.0%. ? ?moderate snoring was noted during this study. ? ?CARDIAC DATA ?The 2 lead EKG demonstrated sinus rhythm. The mean heart rate was 58.8  beats per minute. Other EKG findings include: None. ? ?LEG MOVEMENT DATA ?The total PLMS were 0 with a resulting PLMS index of 0.0. Associated arousal with leg movement index was 0.2 . ? ?IMPRESSIONS ?- Severe obstructive sleep apnea occurred during this study (AHI = 100.7/h). ?- No significant central sleep apnea occurred during this study (CAI = 0.4/h). ?- Moderate oxygen desaturation was noted during this study (Min O2 = 80.0%). Mean 92.6% ?- The patient snored with moderate snoring volume. ?- No cardiac abnormalities were noted during this study. ?- Clinically significant periodic limb movements did not occur during sleep. No significant associated arousals. ?- Bruxism noted. ? 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 ?RECOMMENDATIONS ?- Suggerst CPAP titration sleep study or autopap. Other options would be based on clinical judgment. ?- Be careful with alcohol, sedatives and other CNS depressants that may worsen sleep apnea and disrupt normal sleep architecture. ?- Sleep hygiene should be reviewed to assess factors that may improve sleep quality. ?- Weight management and regular exercise should be initiated or continued if appropriate. ? ?[Electronically signed] 10/19/2021 11:49 AM ? ?Baird Lyons MD, ABSM ?Diplomate, Tax adviser of Sleep Medicine ?NPI: [id number] ? ?  ? ? ? ? ? ? ? ? ? ? ? ? ? ? ? ? ? ? ? ? ? ?Maurisha Mongeau ?Diplomate, Tax adviser of Sleep Medicine ? ?ELECTRONICALLY SIGNED ON:  10/19/2021, 11:45 AM ?[place] ?PH: [phone]   FX: [phone] ?ACCREDITED BY THE [employer]ICAN [employer]IC[employer] ?

## 2021-10-22 ENCOUNTER — Other Ambulatory Visit: Payer: Medicaid Other

## 2021-10-24 ENCOUNTER — Other Ambulatory Visit: Payer: Self-pay | Admitting: Internal Medicine

## 2021-10-24 DIAGNOSIS — N6320 Unspecified lump in the left breast, unspecified quadrant: Secondary | ICD-10-CM

## 2021-10-25 ENCOUNTER — Other Ambulatory Visit (INDEPENDENT_AMBULATORY_CARE_PROVIDER_SITE_OTHER): Payer: Medicaid Other

## 2021-10-25 VITALS — BP 136/88 | HR 68

## 2021-10-25 DIAGNOSIS — Z79899 Other long term (current) drug therapy: Secondary | ICD-10-CM

## 2021-10-25 DIAGNOSIS — Z0130 Encounter for examination of blood pressure without abnormal findings: Secondary | ICD-10-CM

## 2021-10-25 NOTE — Progress Notes (Signed)
Called and cancelled her Rx for Glipizide as she is having low blood glucoses with the reduced dosing.  ?Left message with [place] ?

## 2021-10-25 NOTE — Progress Notes (Signed)
Patient reported that she has been taking bp medication consistently. Patient also wanted to report that she has stopped taking glipizide a few days ago because she has noticed that it drops her sugar too low. Lowest that she has seen it was '42mg'$ /dL. Would like recommendation on if she need to continue taking glipizide or confirm that it is fine to stop it.  ?

## 2021-10-26 LAB — BASIC METABOLIC PANEL
BUN/Creatinine Ratio: 20 (ref 9–23)
BUN: 20 mg/dL (ref 6–24)
CO2: 26 mmol/L (ref 20–29)
Calcium: 9.8 mg/dL (ref 8.7–10.2)
Chloride: 99 mmol/L (ref 96–106)
Creatinine, Ser: 0.99 mg/dL (ref 0.57–1.00)
Glucose: 127 mg/dL — ABNORMAL HIGH (ref 70–99)
Potassium: 4.1 mmol/L (ref 3.5–5.2)
Sodium: 140 mmol/L (ref 134–144)
eGFR: 67 mL/min/{1.73_m2} (ref >59)

## 2021-10-31 ENCOUNTER — Encounter: Payer: Medicaid Other | Admitting: Internal Medicine

## 2021-11-05 ENCOUNTER — Ambulatory Visit
Admission: RE | Admit: 2021-11-05 | Discharge: 2021-11-05 | Disposition: A | Discharge status: Home / Self Care | Payer: Medicaid Other | Source: Ambulatory Visit | Attending: Internal Medicine | Admitting: Internal Medicine

## 2021-11-05 ENCOUNTER — Other Ambulatory Visit: Payer: Self-pay | Admitting: Internal Medicine

## 2021-11-05 DIAGNOSIS — N6310 Unspecified lump in the right breast, unspecified quadrant: Secondary | ICD-10-CM

## 2021-11-05 DIAGNOSIS — N6320 Unspecified lump in the left breast, unspecified quadrant: Secondary | ICD-10-CM

## 2021-11-25 ENCOUNTER — Telehealth: Payer: Self-pay

## 2021-11-25 NOTE — Telephone Encounter (Signed)
Patient would like to discuss her sleep study results. Concerned that she may need a cpap machine.

## 2021-12-04 ENCOUNTER — Telehealth: Payer: Self-pay | Admitting: Internal Medicine

## 2021-12-04 DIAGNOSIS — G4733 Obstructive sleep apnea (adult) (pediatric): Secondary | ICD-10-CM

## 2021-12-04 DIAGNOSIS — R0609 Other forms of dyspnea: Secondary | ICD-10-CM

## 2021-12-04 NOTE — Telephone Encounter (Signed)
Received preop risk assessment from [facility] Patient in process of OSA assessment and needs to return for CPAP titration. She also gives history of exertional dypnea with need to stop and catch breath with walking max of 1/2 mile. She sleeps on 4 pillows, partly for GERD sx, but also awakens short of breath. Suspect the awakening in the night due to apneic episodes, but she does have significant risk factors for cardiac disease. Has chronic poor R wave progression in V1 and V2 with concern for septal infarct noted first on 02/14/2016 Did have echo, stress treadmill and nuclear medicine cardiac perfusion study 08/04/2016 at [facility] that did not show any regional wall motion abnormalities nor ischemic changes on ECG nor fixed or reversible defect to suggest infarct or ischemic change.  EF on echo was 60%.  All of this makes a cardiac etiology less likely for her dyspneic symptoms noted above, but was 5 year ago. Have asked Maureen Swanson to put a hold on her knee surgery while I send her back for CPAP titration and to cardiology for clearance.Marland Kitchen

## 2021-12-05 NOTE — Telephone Encounter (Signed)
Patient has been scheduled for bp and A1c on 12/10/21

## 2021-12-10 ENCOUNTER — Ambulatory Visit (INDEPENDENT_AMBULATORY_CARE_PROVIDER_SITE_OTHER): Payer: Medicaid Other

## 2021-12-10 VITALS — BP 110/68 | HR 64

## 2021-12-10 DIAGNOSIS — Z0130 Encounter for examination of blood pressure without abnormal findings: Secondary | ICD-10-CM

## 2021-12-10 DIAGNOSIS — Z794 Long term (current) use of insulin: Secondary | ICD-10-CM | POA: Diagnosis not present

## 2021-12-10 DIAGNOSIS — E118 Type 2 diabetes mellitus with unspecified complications: Secondary | ICD-10-CM | POA: Diagnosis not present

## 2021-12-11 ENCOUNTER — Ambulatory Visit (INDEPENDENT_AMBULATORY_CARE_PROVIDER_SITE_OTHER): Payer: Medicaid Other | Admitting: Cardiovascular Disease

## 2021-12-11 ENCOUNTER — Encounter: Payer: Self-pay | Admitting: Cardiovascular Disease

## 2021-12-11 ENCOUNTER — Ambulatory Visit (HOSPITAL_BASED_OUTPATIENT_CLINIC_OR_DEPARTMENT_OTHER): Payer: Medicaid Other | Attending: Internal Medicine | Admitting: Internal Medicine

## 2021-12-11 ENCOUNTER — Other Ambulatory Visit: Payer: Self-pay

## 2021-12-11 DIAGNOSIS — R0609 Other forms of dyspnea: Secondary | ICD-10-CM | POA: Diagnosis not present

## 2021-12-11 DIAGNOSIS — G4733 Obstructive sleep apnea (adult) (pediatric): Secondary | ICD-10-CM

## 2021-12-11 DIAGNOSIS — Z01812 Encounter for preprocedural laboratory examination: Secondary | ICD-10-CM

## 2021-12-11 DIAGNOSIS — E785 Hyperlipidemia, unspecified: Secondary | ICD-10-CM

## 2021-12-11 DIAGNOSIS — R072 Precordial pain: Secondary | ICD-10-CM | POA: Diagnosis not present

## 2021-12-11 DIAGNOSIS — Z01818 Encounter for other preprocedural examination: Secondary | ICD-10-CM

## 2021-12-11 DIAGNOSIS — K219 Gastro-esophageal reflux disease without esophagitis: Secondary | ICD-10-CM

## 2021-12-11 DIAGNOSIS — I10 Essential (primary) hypertension: Secondary | ICD-10-CM | POA: Diagnosis not present

## 2021-12-11 DIAGNOSIS — J4520 Mild intermittent asthma, uncomplicated: Secondary | ICD-10-CM

## 2021-12-11 DIAGNOSIS — E119 Type 2 diabetes mellitus without complications: Secondary | ICD-10-CM

## 2021-12-11 LAB — HEMOGLOBIN A1C
Est. average glucose Bld gHb Est-mCnc: 148 mg/dL
Hgb A1c MFr Bld: 6.8 % — ABNORMAL HIGH (ref 4.8–5.6)

## 2021-12-11 NOTE — Progress Notes (Signed)
Cardiology Office Note    Date:  12/11/2021   ID:  Maureen Swanson, DOB 27-Dec-1965, MRN [id number]  PCP:  Mack Hook, MD Ortho: Dr. Delfino Lovett  Cardiologist:  Shelva Majestic, MD   Cardiology consultation referred through the courtesy of Dr. Jodi Marble for preoperative cardiology clearance prior to undergoing active knee replacement surgery.  History of Present Illness:  Maureen Swanson is a 56 y.o. female who is followed by Dr. Jodi Marble, MD at [facility].  She has a history of morbid obesity, diabetes mellitus, hypertension, hyperlipidemia, GERD, asthma and arthritis.  She was recently found to have severe obstructive sleep apnea with an AHI of 100.7/h on a diagnostic polysomnogram on October 09, 2021 interpreted by Dr. Annamaria Boots.  She is scheduled to undergo CPAP titration study later today.  Maureen Swanson has seen Dr. Lyla Glassing for left knee discomfort and will require surgery.  She has had issues with frequent shortness of breath, with significant shortness of breath walking to her mailbox, as well as some vague chest pressure sensation which can be nonexertional in precipitation.  She admits to a history of hypertension in excess of 40 years, diabetes mellitus for at least 20 years, for the past several years has been on lipid-lowering therapy with atorvastatin 40 mg.  Most recently she is on atenolol 100 mg daily, amlodipine 10 mg daily, HCTZ 25 mg in addition to losartan 100 mg daily for hypertension control.  Has a history of depression and is on Abilify and has neuropathy on gabapentin.  She has a prescription for albuterol to take on an as-needed basis for her asthma.  She is diabetic on metformin and is on omeprazole for GERD.  She presents for preoperative clearance prior to potential left knee replacement surgery.   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 x 12 yrs.   Substance abuse ([place])    crack cocaine:  extensive treatment with [facility] from 2014 to 2018 for this and alcohol abuse.   TMJ (temporomandibular joint disorder)     Past Surgical History:  Procedure Laterality Date   BREAST EXCISIONAL BIOPSY Right    BREAST LUMPECTOMY WITH NEEDLE LOCALIZATION  04/19/2012   Procedure: BREAST LUMPECTOMY WITH NEEDLE LOCALIZATION;  Surgeon: [name], MD;  Location: [facility];  Service: General;  Laterality: Right;   KNEE ARTHROSCOPY W/ PARTIAL MEDIAL MENISCECTOMY  05/01/2010   right    Current Medications: Outpatient Medications Prior to Visit  Medication Sig Dispense Refill   ABILIFY MAINTENA 400 MG PRSY prefilled syringe Inject 400 mg into the muscle every 30 (thirty) days. Last injection given on 01/10/2021. Next injection due 02/07/2021 1 each    Accu-Chek Softclix Lancets lancets Check blood glucose twice daily before meals 100 each 12   albuterol (VENTOLIN HFA) 108 (90 Base) MCG/ACT inhaler Inhale 2 puffs into the  lungs every 6 (six) hours as needed for wheezing or shortness of breath. 18 g 4   amLODipine (NORVASC) 10 MG tablet 1 tab by mouth daily     atenolol (TENORMIN) 100 MG tablet Take 1 tablet (100 mg total) by mouth daily. 30 tablet 11   atorvastatin (LIPITOR) 40 MG tablet Take 1 tablet (40 mg total) by mouth at bedtime. 30 tablet 0   Blood Glucose Monitoring Suppl (ACCU-CHEK AVIVA PLUS) w/Device KIT Check blood glucose twice daily before meals 1 kit 0   Blood Pressure Monitoring (BLOOD PRESSURE KIT) DEVI Check blood pressure 3 times weekly 1 each 0    cetirizine (ZYRTEC) 10 MG tablet Take 1 tablet (10 mg total) by mouth daily as needed for allergies. 30 tablet 11   diclofenac Sodium (VOLTAREN) 1 % GEL Apply 2g to knees up to 4 times daily as needed for pain 100 g 6   doxycycline (VIBRA-TABS) 100 MG tablet 1 tab by mouth twice daily for 7 days 14 tablet 0   gabapentin (NEURONTIN) 300 MG capsule Take 1 capsule (300 mg total) by mouth 3 (three) times daily. 90 capsule 0   glucose blood (ACCU-CHEK AVIVA PLUS) test strip Check blood glucose twice daily before meals 100 each 12   hydrochlorothiazide (HYDRODIURIL) 25 MG tablet 1 tab by mouth with morning meal and Losartan daily 30 tablet 11   ibuprofen (ADVIL) 800 MG tablet 1 tab by mouth twice daily with meals as needed. 60 tablet 4   lamoTRIgine (LAMICTAL) 100 MG tablet Take 100 mg by mouth daily.     losartan (COZAAR) 100 MG tablet 1 tab by mouth with HCTZ in morning daily. 30 tablet 11   melatonin 3 MG TABS tablet Take 1 tablet (3 mg total) by mouth at bedtime. 30 tablet 0   metFORMIN (GLUCOPHAGE) 1000 MG tablet 1 tab by mouth twice daily with meals 60 tablet 11   omeprazole (PRILOSEC) 40 MG capsule 1 cap by mouth in morning 1/2 hour before breakfast and other meds 30 capsule 11   traZODone (DESYREL) 150 MG tablet Take 400 mg by mouth at bedtime.     benztropine (COGENTIN) 1 MG tablet Take 1 tablet (1 mg total) by mouth at bedtime. (Patient not taking: Reported on 12/11/2021) 60 tablet 0   cyclobenzaprine (FLEXERIL) 10 MG tablet 1/2 tab by mouth at bedtime as needed for muscle spasm (Patient not taking: Reported on 12/11/2021) 30 tablet 6   glipiZIDE (GLUCOTROL) 5 MG tablet 1 tab by mouth twice daily with meals (Patient not taking: Reported on 12/11/2021) 60 tablet 11   metoCLOPramide (REGLAN) 10 MG tablet 1/2 to 1 tab at bedtime as needed for nausea (Patient not taking: Reported on 12/11/2021) 30 tablet 11   prazosin (MINIPRESS) 1 MG capsule Take 1 mg by mouth at bedtime. (Patient not taking:  Reported on 12/11/2021)     No facility-administered medications prior to visit.     Allergies:   Chocolate, Orange, Penicillins, Strawberry extract, and Tomato   Social History   Socioeconomic History   Marital status: Single    Spouse name: Not on file   Number of children: Not on file   Years of education: Not on file   Highest education level: Not on file  Occupational History   Not on file  Tobacco Use   Smoking status: Former    Packs/day: 1.00    Years: 0.00    Total pack years: 0.00    Types: Cigarettes  Smokeless tobacco: Never  Vaping Use   Vaping Use: Never used  Substance and Sexual Activity   Alcohol use: Not Currently    Comment: 3x 40 oz beers/day   Drug use: Not Currently    Frequency: 5.0 times per week    Types: Cocaine, "Crack" cocaine   Sexual activity: Not Currently    Birth control/protection: Post-menopausal  Other Topics Concern   Not on file  Social History Narrative   Not on file   Social Determinants of Health   Financial Resource Strain: Not on file  Food Insecurity: Not on file  Transportation Needs: Not on file  Physical Activity: Not on file  Stress: Not on file  Social Connections: Not on file     Additional social history is notable and that she was born in [place].  She is single.  She has 2 children and 6 grandchildren.  She is disabled.  She completed 12 grade of education.  There is no tobacco or alcohol use.  She does walk 3 days/week.  Family History:  The patient's family history includes Alcoholism in her maternal uncle; Bipolar disorder in her maternal aunt; Breast cancer in her mother; Cancer in her father; Diabetes in her mother; Schizophrenia in her maternal grandfather.   Family history is notable that both parents are deceased.  Mother died at age 9 and had diabetes, heart disease and stroke.  Father died at age 71 with cancer.  She has a brother age 9 and a sister age 39 who are alive.  She has a son age 56 and  a daughter age 26.  ROS General: Negative; No fevers, chills, or night sweats; morbid obese HEENT: Negative; No changes in vision or hearing, sinus congestion, difficulty swallowing Pulmonary: Negative; No cough, wheezing, shortness of breath, hemoptysis Cardiovascular: See HPI GI: Negative; No nausea, vomiting, diarrhea, or abdominal pain GU: Negative; No dysuria, hematuria, or difficulty voiding Musculoskeletal: Left knee discomfort Hematologic/Oncology: Negative; no easy bruising, bleeding Endocrine: Diabetes mellitus Neuro: Negative; no changes in balance, headaches Skin: Negative; No rashes or skin lesions Psychiatric: Positive for depression Sleep: Very severe OSA noted on polysomnogram, for CPAP titration study this evening.   Other comprehensive 14 point system review is negative.   PHYSICAL EXAM:   VS:  BP 132/82   Pulse 77   Ht '5\' 4"'  (1.626 m)   Wt 245 lb 12.8 oz (111.5 kg)   SpO2 97%   BMI 42.19 kg/m     Repeat blood pressure by me was 122/74  Wt Readings from Last 3 Encounters:  12/11/21 245 lb 12.8 oz (111.5 kg)  10/09/21 234 lb (106.1 kg)  09/18/21 242 lb (109.8 kg)    General: Alert, oriented, no distress.  Skin: normal turgor, no rashes, warm and dry HEENT: Normocephalic, atraumatic. Pupils equal round and reactive to light; sclera anicteric; extraocular muscles intact; Nose without nasal septal hypertrophy Mouth/Parynx benign; Mallinpatti scale 3/4 Neck: No JVD, no carotid bruits; normal carotid upstroke Lungs: clear to ausculatation and percussion; no wheezing or rales Chest wall: without tenderness to palpitation Heart: PMI not displaced, RRR, s1 s2 normal, 1/6 systolic murmur, no diastolic murmur, no rubs, gallops, thrills, or heaves Abdomen: Central adiposity soft, nontender; no hepatosplenomehaly, BS+; abdominal aorta nontender and not dilated by palpation. Back: no CVA tenderness Pulses 2+ Musculoskeletal: full range of motion, normal strength, no  joint deformities Extremities: no clubbing cyanosis or edema, Homan's sign negative  Neurologic: grossly nonfocal; Cranial nerves grossly wnl Psychologic: Normal mood  and affect   Studies/Labs Reviewed:   December 11, 2021 ECG (independently read by me): Normal sinus rhythm at 77 bpm.  Moderate LVH by voltage criteria.  Specific inferolateral T wave abnormality.  Normal intervals  Recent Labs:    Latest Ref Rng & Units 10/25/2021    9:47 AM 07/02/2021    9:43 AM 06/23/2021    1:24 PM  BMP  Glucose 70 - 99 mg/dL 127  95  74   BUN 6 - 24 mg/dL '20  21  17   ' Creatinine 0.57 - 1.00 mg/dL 0.99  0.86  0.86   BUN/Creat Ratio 9 - '23 20  24    ' Sodium 134 - 144 mmol/L 140  144  140   Potassium 3.5 - 5.2 mmol/L 4.1  4.3  3.7   Chloride 96 - 106 mmol/L 99  102  104   CO2 20 - 29 mmol/L '26  22  28   ' Calcium 8.7 - 10.2 mg/dL 9.8  9.6  9.2         Latest Ref Rng & Units 07/02/2021    9:43 AM 06/23/2021    1:24 PM 05/29/2021    4:32 PM  Hepatic Function  Total Protein 6.0 - 8.5 g/dL 7.0  7.1  6.9   Albumin 3.8 - 4.9 g/dL 4.4  3.7  3.6   AST 0 - 40 IU/L 44  51  20   ALT 0 - 32 IU/L 72  72  17   Alk Phosphatase 44 - 121 IU/L 81  72  64   Total Bilirubin 0.0 - 1.2 mg/dL 0.3  0.5  0.5        Latest Ref Rng & Units 06/23/2021   12:11 PM 05/29/2021    4:32 PM 01/01/2021   10:22 AM  CBC  WBC 4.0 - 10.5 K/uL 6.2  7.7  6.8   Hemoglobin 12.0 - 15.0 g/dL 11.1  13.1  11.8   Hematocrit 36.0 - 46.0 % 35.6  40.9  38.0   Platelets 150 - 400 K/uL 178  199  184    Lab Results  Component Value Date   MCV 94.7 06/23/2021   MCV 91.9 05/29/2021   MCV 92.5 01/01/2021   Lab Results  Component Value Date   TSH 0.420 05/29/2021   Lab Results  Component Value Date   HGBA1C 6.8 (H) 12/10/2021     BNP    Component Value Date/Time   BNP 68.2 10/13/2014 1912    ProBNP    Component Value Date/Time   PROBNP <30.0 10/30/2008 1300     Lipid Panel     Component Value Date/Time   CHOL 145  08/15/2021 0945   TRIG 87 08/15/2021 0945   HDL 65 08/15/2021 0945   CHOLHDL 3.1 05/29/2021 1632   VLDL 33 05/29/2021 1632   LDLCALC 64 08/15/2021 0945   LABVLDL 16 08/15/2021 0945     RADIOLOGY: No results found.   Additional studies/ records that were reviewed today include:  I have reviewed the records from Dr. Mack [facility] from [facility].  I have reviewed the polysomnogram dated October 09, 2021 interpreted by Dr. Annamaria Boots.  ASSESSMENT:    1. Preoperative clearance   2. DOE (dyspnea on exertion)   3. Essential hypertension   4. Precordial pain   5. Hyperlipidemia with target LDL less than 70   6. Severe OSA (obstructive sleep apnea)   7. Type 2 diabetes mellitus without complication, without long-term  current use of insulin ([place])   8. Gastroesophageal reflux disease without esophagitis   9. Intermittent asthma without complication, unspecified asthma severity   10. Pre-procedure lab exam     PLAN:  Maureen Swanson is a 56 year old African-American female who has a history of morbid obesity significant cardiovascular risk factors including at least a 40-year history of hypertension, 20-year history of diabetes mellitus, hyperlipidemia, and has had issues with GERD, asthma, arthritis, and depression.  Recently diagnosed with severe obstructive sleep apnea with an AHI in excess of 100 which is currently being evaluated and she is scheduled for CPAP titration study this evening with Dr. Annamaria Boots.  She does not routinely exercise and recently has had progressive left knee discomfort for which she may require surgery.  She admits to increasing episodes of shortness of breath which is worse with exercise.  She cannot walk to her mailbox without having significant shortness of breath.  She denies any definitive substernal chest tightness but at times does note a stinging chest pressure which may last 10 minutes in her chest which at times is nonexertional.  I had a  lengthy discussion with her today in the office.  Presently I am recommending she undergo a 2D echo Doppler study for assessment of LV systolic and diastolic function, valvular architecture, left ventricular hypertrophy and pulmonary pressures.  Sniffing and cardiovascular risk factors and her symptoms of chest discomfort I have recommended she undergo coronary CTA evaluation to assess both calcium score as well as percent of luminal stenosis if CAD is present for preoperative evaluation.  She is on lipid-lowering therapy with atorvastatin 40 mg.  Target LDL is less than 70.  Her blood pressure today is relatively well controlled on her multidrug regimen consisting of amlodipine 10 mg, atenolol 100 mg, hydrochlorothiazide 25 mg and losartan 100 mg daily.  Currently she is on a diabetic regimen consisting of metformin.  She is on omeprazole for GERD.  She will need institution of CPAP therapy following her CPAP titration tonight.  Untreated sleep apnea will have significant increased adverse cardiovascular potential effects on hypertension, potential for nocturnal arrhythmias, increased risk for atrial fibrillation, reduced insulin sensitivity, increased inflammation, GERD, as well as potential for nocturnal hypoxemia contributing to nocturnal ischemia.  We will schedule the studies to be done over the next several weeks and I will plan to see her in 3 to 4 weeks for follow-up evaluation prior to giving clearance for her potential upcoming elective surgery.   Medication Adjustments/Labs and Tests Ordered: Current medicines are reviewed at length with the patient today.  Concerns regarding medicines are outlined above.  Medication changes, Labs and Tests ordered today are listed in the Patient Instructions below. Patient Instructions  Medication Instructions:  Your Physician recommend you continue on your current medication as directed.    *If you need a refill on your cardiac medications before your next  appointment, please call your pharmacy*   Lab Work: Your physician recommend lab work today (BMP)  If you have labs (blood work) drawn today and your tests are completely normal, you will receive your results only by: [place] (if you have MyChart) OR A paper copy in the mail If you have any lab test that is abnormal or we need to change your treatment, we will call you to review the results.   Testing/Procedures: Your physician has requested that you have an echocardiogram. Echocardiography is a painless test that uses sound waves to create images of your heart. It  provides your doctor with information about the size and shape of your heart and how well your heart's chambers and valves are working. This procedure takes approximately one hour. There are no restrictions for this procedure. [place] [address]  Cardiac CT Angiography (CTA), is a special type of CT scan that uses a computer to produce multi-dimensional views of major blood vessels throughout the body. In CT angiography, a contrast material is injected through an IV to help visualize the blood vessels [facility]    Follow-Up: At [facility], you and your health needs are our priority.  As part of our continuing mission to provide you with exceptional heart care, we have created designated Provider Care Teams.  These Care Teams include your primary Cardiologist (physician) and Advanced Practice Providers (APPs -  Physician Assistants and Nurse Practitioners) who all work together to provide you with the care you need, when you need it.  We recommend signing up for the patient portal called "MyChart".  Sign up information is provided on this After Visit Summary.  MyChart is used to connect with patients for Virtual Visits (Telemedicine).  Patients are able to view lab/test results, encounter notes, upcoming appointments, etc.  Non-urgent messages can be sent to your provider as well.   To learn more  about what you can do with MyChart, go to [email or web address].    Your next appointment:   4 week(s)  The format for your next appointment:   In Person  Provider:   Dr. Claiborne Billings    Your cardiac CT will be scheduled at one of the below locations:   [facility] [address] [place], [place] [address] [phone]  If scheduled at [facility], please arrive at the [facility] and Children's Entrance (Entrance C2) of [facility] 30 minutes prior to test start time. You can use the FREE valet parking offered at entrance C (encouraged to control the heart rate for the test)  Proceed to the [facility] Radiology Department (first floor) to check-in and test prep.  All radiology patients and guests should use entrance C2 at [facility], accessed from [facility], even though the hospital's physical address listed is [address].    If scheduled at [facility], please arrive 15 mins early for check-in and test prep.  Please follow these instructions carefully (unless otherwise directed):   On the Night Before the Test: Be sure to Drink plenty of water. Do not consume any caffeinated/decaffeinated beverages or chocolate 12 hours prior to your test. Do not take any antihistamines 12 hours prior to your test.   On the Day of the Test: Drink plenty of water until 1 hour prior to the test. Do not eat any food 4 hours prior to the test. You may take your regular medications prior to the test.  Take Atenolol 100 mg two hours prior to test. HOLD Hydrochlorothiazide morning of the test. FEMALES- please wear underwire-free bra if available, avoid dresses & tight clothing        After the Test: Drink plenty of water. After receiving IV contrast, you may experience a mild flushed feeling. This is normal. On occasion, you may experience a mild rash up to 24 hours after the test. This is not dangerous.  If this occurs, you can take Benadryl 25 mg and increase your fluid intake. If you experience trouble breathing, this can be serious. If it is severe call 911  IMMEDIATELY. If it is mild, please call our office. If you take any of these medications: Glipizide/Metformin, Avandament, Glucavance, please do not take 48 hours after completing test unless otherwise instructed.  We will call to schedule your test 2-4 weeks out understanding that some insurance companies will need an authorization prior to the service being performed.   For non-scheduling related questions, please contact the cardiac imaging nurse navigator should you have any questions/concerns: Marchia Bond, Cardiac Imaging Nurse Navigator Gordy Clement, Cardiac Imaging Nurse Navigator [facility] Direct Office Dial: [phone]   For scheduling needs, including cancellations and rescheduling, please call [place], [phone].         Signed, Shelva Majestic, MD  12/11/2021 5:57 PM    [facility] [address], [place], [place], [place]  [address] Phone: [phone]

## 2021-12-11 NOTE — Telephone Encounter (Signed)
She does--I sent an order to get her set up with CPAP titration--can you check to see if they are working on that?

## 2021-12-11 NOTE — Patient Instructions (Addendum)
Medication Instructions:  Your Physician recommend you continue on your current medication as directed.    *If you need a refill on your cardiac medications before your next appointment, please call your pharmacy*   Lab Work: Your physician recommend lab work today (BMP)  If you have labs (blood work) drawn today and your tests are completely normal, you will receive your results only by: [place] (if you have MyChart) OR A paper copy in the mail If you have any lab test that is abnormal or we need to change your treatment, we will call you to review the results.   Testing/Procedures: Your physician has requested that you have an echocardiogram. Echocardiography is a painless test that uses sound waves to create images of your heart. It provides your doctor with information about the size and shape of your heart and how well your heart's chambers and valves are working. This procedure takes approximately one hour. There are no restrictions for this procedure. [facility] [address]  Cardiac CT Angiography (CTA), is a special type of CT scan that uses a computer to produce multi-dimensional views of major blood vessels throughout the body. In CT angiography, a contrast material is injected through an IV to help visualize the blood vessels [facility]    Follow-Up: At [facility], you and your health needs are our priority.  As part of our continuing mission to provide you with exceptional heart care, we have created designated Provider Care Teams.  These Care Teams include your primary Cardiologist (physician) and Advanced Practice Providers (APPs -  Physician Assistants and Nurse Practitioners) who all work together to provide you with the care you need, when you need it.  We recommend signing up for the patient portal called "MyChart".  Sign up information is provided on this After Visit Summary.  MyChart is used to connect with patients for Virtual Visits  (Telemedicine).  Patients are able to view lab/test results, encounter notes, upcoming appointments, etc.  Non-urgent messages can be sent to your provider as well.   To learn more about what you can do with MyChart, go to [email or web address].    Your next appointment:   4 week(s)  The format for your next appointment:   In Person  Provider:   Dr. Claiborne Billings    Your cardiac CT will be scheduled at one of the below locations:   [facility] [address] [place], [place] [address] [phone]  If scheduled at [facility], please arrive at the [facility] and Children's Entrance (Entrance C2) of [facility] 30 minutes prior to test start time. You can use the FREE valet parking offered at entrance C (encouraged to control the heart rate for the test)  Proceed to the [facility] Radiology Department (first floor) to check-in and test prep.  All radiology patients and guests should use entrance C2 at [facility], accessed from [facility], even though the hospital's physical address listed is [address]    If scheduled at [facility], please arrive 15 mins early for check-in and test prep.  Please follow these instructions carefully (unless otherwise directed):   On the Night Before the Test: Be sure to Drink plenty of water. Do not consume any caffeinated/decaffeinated beverages or chocolate 12 hours prior to your test. Do not take any antihistamines 12 hours prior to your test.   On the Day of the Test: Drink plenty of water until  1 hour prior to the test. Do not eat any food 4 hours prior to the test. You may take your regular medications prior to the test.  Take Atenolol 100 mg two hours prior to test. HOLD Hydrochlorothiazide morning of the test. FEMALES- please wear underwire-free bra if available, avoid dresses & tight clothing        After the Test: Drink plenty of water. After  receiving IV contrast, you may experience a mild flushed feeling. This is normal. On occasion, you may experience a mild rash up to 24 hours after the test. This is not dangerous. If this occurs, you can take Benadryl 25 mg and increase your fluid intake. If you experience trouble breathing, this can be serious. If it is severe call 911 IMMEDIATELY. If it is mild, please call our office. If you take any of these medications: Glipizide/Metformin, Avandament, Glucavance, please do not take 48 hours after completing test unless otherwise instructed.  We will call to schedule your test 2-4 weeks out understanding that some insurance companies will need an authorization prior to the service being performed.   For non-scheduling related questions, please contact the cardiac imaging nurse navigator should you have any questions/concerns: Marchia Bond, Cardiac Imaging Nurse Navigator Gordy Clement, Cardiac Imaging Nurse Navigator [place] Direct Office Dial: [phone]   For scheduling needs, including cancellations and rescheduling, please call [place], [phone].

## 2021-12-14 LAB — BASIC METABOLIC PANEL
BUN/Creatinine Ratio: 18 (ref 9–23)
BUN: 23 mg/dL (ref 6–24)
CO2: 22 mmol/L (ref 20–29)
Calcium: 9.4 mg/dL (ref 8.7–10.2)
Chloride: 101 mmol/L (ref 96–106)
Creatinine, Ser: 1.3 mg/dL — ABNORMAL HIGH (ref 0.57–1.00)
Glucose: 140 mg/dL — ABNORMAL HIGH (ref 70–99)
Potassium: 4 mmol/L (ref 3.5–5.2)
Sodium: 140 mmol/L (ref 134–144)
eGFR: 48 mL/min/{1.73_m2} — ABNORMAL LOW (ref >59)

## 2021-12-16 NOTE — Progress Notes (Signed)
After reporting bp to Dr Amil Amen, no changes will be made to her medication.

## 2021-12-21 DIAGNOSIS — G4733 Obstructive sleep apnea (adult) (pediatric): Secondary | ICD-10-CM

## 2021-12-21 NOTE — Procedures (Signed)
    Patient Name: Maureen, Swanson Date: 12/11/2021 Gender: Female D.O.B: September 11, 1965 Age (years): 56 Referring Provider: Mack Hook Height (inches): 64 Interpreting Physician: Baird Lyons MD, ABSM Weight (lbs): 245 RPSGT: Zadie Rhine BMI: 42 MRN: [id number] Neck Size: 15.50  CLINICAL INFORMATION The patient is referred for a CPAP titration to treat sleep apnea.  Date of NPSG, Split Night or HST: NPSG 10/09/21  AHI 100.7/ hr, desaturation to 80%, body weight 234 lbs  SLEEP STUDY TECHNIQUE As per the AASM Manual for the Scoring of Sleep and Associated Events v2.3 (April 2016) with a hypopnea requiring 4% desaturations.  The channels recorded and monitored were frontal, central and occipital EEG, electrooculogram (EOG), submentalis EMG (chin), nasal and oral airflow, thoracic and abdominal wall motion, anterior tibialis EMG, snore microphone, electrocardiogram, and pulse oximetry. Continuous positive airway pressure (CPAP) was initiated at the beginning of the study and titrated to treat sleep-disordered breathing.  MEDICATIONS Medications self-administered by patient taken the night of the study : TRAZODONE  TECHNICIAN COMMENTS Comments added by technician: pt had one restroom visit Comments added by scorer: N/A  RESPIRATORY PARAMETERS Optimal PAP Pressure (cm): 16 AHI at Optimal Pressure (/hr): 0 Overall Minimal O2 (%): 78.0 Supine % at Optimal Pressure (%): 100 Minimal O2 at Optimal Pressure (%): 89.0   SLEEP ARCHITECTURE The study was initiated at 9:28:56 PM and ended at 4:22:32 AM.  Sleep onset time was 1.8 minutes and the sleep efficiency was 89.8%%. The total sleep time was 371.5 minutes.  The patient spent 2.8%% of the night in stage N1 sleep, 78.5%% in stage N2 sleep, 0.0%% in stage N3 and 18.7% in REM.Stage REM latency was 133.5 minutes  Wake after sleep onset was 40.3. Alpha intrusion was absent. Supine sleep was 98.11%.  CARDIAC DATA The 2 lead EKG  demonstrated sinus rhythm. The mean heart rate was 69.0 beats per minute. Other EKG findings include: None.  LEG MOVEMENT DATA The total Periodic Limb Movements of Sleep (PLMS) were 0. The PLMS index was 0.0. A PLMS index of <15 is considered normal in adults.  IMPRESSIONS - The optimal PAP pressure was 16 cm of water. - Severe oxygen desaturations were observed during this titration (min O2 = 78.0%). On CPAP 16, minimum O2 saturation 89%, mean 93.3% - No snoring was audible during this study. - No cardiac abnormalities were observed during this study. - Clinically significant periodic limb movements were not noted during this study. Arousals associated with PLMs were rare.  DIAGNOSIS - Obstructive Sleep Apnea (G47.33)  RECOMMENDATIONS - Trial of CPAP therapy on 16 cm H2O or autopap 10-20. - Patient used a Medium size Resmed Full Face Quattro FX for Her mask and heated humidification. - Be careful with alcohol, sedatives and other CNS depressants that may worsen sleep apnea and disrupt normal sleep architecture. - Sleep hygiene should be reviewed to assess factors that may improve sleep quality. - Weight management and regular exercise should be initiated or continued.  [Electronically signed] 12/21/2021 11:32 AM  Baird Lyons MD, ABSM Diplomate, American Board of Sleep Medicine NPI: [id number]                         [place], [place] [facility]  ELECTRONICALLY SIGNED ON:  12/21/2021, 11:26 AM [place] PH: [phone]   FX: [phone] [place]

## 2021-12-31 ENCOUNTER — Encounter: Payer: Self-pay | Admitting: Internal Medicine

## 2021-12-31 ENCOUNTER — Ambulatory Visit (INDEPENDENT_AMBULATORY_CARE_PROVIDER_SITE_OTHER): Payer: Medicaid Other | Admitting: Internal Medicine

## 2021-12-31 VITALS — BP 112/78 | HR 64 | Resp 20 | Ht 63.5 in | Wt 246.0 lb

## 2021-12-31 DIAGNOSIS — A5901 Trichomonal vulvovaginitis: Secondary | ICD-10-CM | POA: Diagnosis not present

## 2021-12-31 DIAGNOSIS — E118 Type 2 diabetes mellitus with unspecified complications: Secondary | ICD-10-CM

## 2021-12-31 DIAGNOSIS — Z0000 Encounter for general adult medical examination without abnormal findings: Secondary | ICD-10-CM

## 2021-12-31 DIAGNOSIS — Z1211 Encounter for screening for malignant neoplasm of colon: Secondary | ICD-10-CM | POA: Diagnosis not present

## 2021-12-31 DIAGNOSIS — E782 Mixed hyperlipidemia: Secondary | ICD-10-CM

## 2021-12-31 DIAGNOSIS — Z794 Long term (current) use of insulin: Secondary | ICD-10-CM

## 2021-12-31 DIAGNOSIS — N760 Acute vaginitis: Secondary | ICD-10-CM | POA: Diagnosis not present

## 2021-12-31 DIAGNOSIS — Z1159 Encounter for screening for other viral diseases: Secondary | ICD-10-CM

## 2021-12-31 DIAGNOSIS — G4733 Obstructive sleep apnea (adult) (pediatric): Secondary | ICD-10-CM | POA: Diagnosis not present

## 2021-12-31 DIAGNOSIS — I10 Essential (primary) hypertension: Secondary | ICD-10-CM

## 2021-12-31 DIAGNOSIS — E010 Iodine-deficiency related diffuse (endemic) goiter: Secondary | ICD-10-CM

## 2021-12-31 LAB — POCT WET PREP WITH KOH
Clue Cells Wet Prep HPF POC: NEGATIVE
KOH Prep POC: NEGATIVE
RBC Wet Prep HPF POC: NEGATIVE
Trichomonas, UA: POSITIVE
Yeast Wet Prep HPF POC: NEGATIVE

## 2021-12-31 MED ORDER — METRONIDAZOLE 500 MG PO TABS: ORAL_TABLET | ORAL | 0 refills | Status: DC

## 2021-12-31 MED ORDER — FLUTICASONE-SALMETEROL 100-50 MCG/ACT IN AEPB: 1.0000 | INHALATION_SPRAY | Freq: Two times a day (BID) | RESPIRATORY_TRACT | 11 refills | Status: DC

## 2021-12-31 NOTE — Progress Notes (Signed)
Subjective:    Patient ID: Maureen Swanson, female   DOB: 1965-07-28, 56 y.o.   MRN: [id number]   HPI  CPE without pap  1.  Pap:  Normal in April of this year.  Performed as undergoing STI evaluation then.   Developed Chlamydia vaginitis and tested + on 07/02/21 and treated with course of Doxycycline.   Returned 09/18/21 with discharge and itching again, assuming she was reinfected as she believed her female partner did not get treated, but did not test positive the second time.  She did receive a second round of Doxy pending the results of work up. States he has told her he has been treated now, but she feels she is having discharge and vaginal itching again similar to what she had initially.  2.  Mammogram:  Last performed 11/05/21 due to concern for mass in right breast.  Normal result.  To call if area of right breast enlarged.  Hx of right lumpectomy with benign results.  No known family history of breast cancer.    3.  Osteoprevention:  Cow's milk causes diarrhea and has not tried other dairy.  2 servings of cheese daily.  Had not tried almond or soy milk.  No period for 10 years or more.  DEXA performed 2013 at the time of menopause, but unable to pull up actual result.  Little physical activity.    4.  Guaiac Cards/FIT:  Never returned  5.  Colonoscopy:  Never.  No known history of colon cancer.  She has had + H. Pylori antigen in stool 2010 and underwent EGD in 2011, but unable to pull up full report.  6.  Immunizations:  Up to date.  Due for shingrix #2 Immunization History  Administered Date(s) Administered   H1N1 07/11/2008   Influenza Inj Mdck Quad With Preservative 06/14/2021   Influenza Split 06/23/2011, 09/09/2012   Influenza Whole 04/08/2010   Influenza, Seasonal, Injecte, Preservative Fre 03/26/2014   Influenza,inj,Quad PF,6+ Mos 07/22/2014, 04/04/2015, 07/20/2015, 03/03/2016   Influenza-Unspecified 04/02/2016, 04/11/2019   Moderna Covid-19 Vaccine Bivalent Booster 75yr  & up 06/14/2021   Moderna Sars-Covid-2 Vaccination 10/10/2019, 11/07/2019   PPD Test 07/25/2015   Pneumococcal Polysaccharide-23 09/09/2012, 02/20/2014, 07/20/2015, 10/22/2015, 08/04/2016   Td 07/11/2008, 06/22/2011, 09/18/2021   Zoster Recombinat (Shingrix) 09/18/2021     7.  Glucose/Cholesterol:  Last A1C 6.8% on 12/10/21 at goal with her diagnosis of DM.  Previously having low blood sugars while also taking glipizide, which was discontinued.  Lipids tested 08/15/21 and also at goal with Atorvastatin:   Lipid Panel     Component Value Date/Time   CHOL 145 08/15/2021 0945   TRIG 87 08/15/2021 0945   HDL 65 08/15/2021 0945   CHOLHDL 3.1 05/29/2021 1632   VLDL 33 05/29/2021 1632   LDLCALC 64 08/15/2021 0945   LABVLDL 16 08/15/2021 0945     Current Meds  Medication Sig   ABILIFY MAINTENA 400 MG PRSY prefilled syringe Inject 400 mg into the muscle every 30 (thirty) days. Last injection given on 01/10/2021. Next injection due 02/07/2021   Accu-Chek Softclix Lancets lancets Check blood glucose twice daily before meals   albuterol (VENTOLIN HFA) 108 (90 Base) MCG/ACT inhaler Inhale 2 puffs into the lungs every 6 (six) hours as needed for wheezing or shortness of breath.   amLODipine (NORVASC) 10 MG tablet 1 tab by mouth daily   atenolol (TENORMIN) 100 MG tablet Take 1 tablet (100 mg total) by mouth daily.   atorvastatin (  LIPITOR) 40 MG tablet Take 1 tablet (40 mg total) by mouth at bedtime.   Blood Glucose Monitoring Suppl (ACCU-CHEK AVIVA PLUS) w/Device KIT Check blood glucose twice daily before meals   Blood Pressure Monitoring (BLOOD PRESSURE KIT) DEVI Check blood pressure 3 times weekly   cetirizine (ZYRTEC) 10 MG tablet Take 1 tablet (10 mg total) by mouth daily as needed for allergies.   diclofenac Sodium (VOLTAREN) 1 % GEL Apply 2g to knees up to 4 times daily as needed for pain   gabapentin (NEURONTIN) 300 MG capsule Take 1 capsule (300 mg total) by mouth 3 (three) times daily.    glucose blood (ACCU-CHEK AVIVA PLUS) test strip Check blood glucose twice daily before meals   hydrochlorothiazide (HYDRODIURIL) 25 MG tablet 1 tab by mouth with morning meal and Losartan daily   ibuprofen (ADVIL) 800 MG tablet 1 tab by mouth twice daily with meals as needed.   lamoTRIgine (LAMICTAL) 100 MG tablet Take 100 mg by mouth daily.   losartan (COZAAR) 100 MG tablet 1 tab by mouth with HCTZ in morning daily.   melatonin 3 MG TABS tablet Take 1 tablet (3 mg total) by mouth at bedtime.   metFORMIN (GLUCOPHAGE) 1000 MG tablet 1 tab by mouth twice daily with meals   omeprazole (PRILOSEC) 40 MG capsule 1 cap by mouth in morning 1/2 hour before breakfast and other meds   traZODone (DESYREL) 150 MG tablet Take 400 mg by mouth at bedtime.   Allergies  Allergen Reactions   Chocolate Hives   Orange Hives and Other (See Comments)    "Acid foods"   Penicillins Hives and Other (See Comments)    Has patient had a PCN reaction causing immediate rash, facial/tongue/throat swelling, SOB or lightheadedness with hypotension: Yes Has patient had a PCN reaction causing severe rash involving mucus membranes or skin necrosis: Yes Has patient had a PCN reaction that required hospitalization No Has patient had a PCN reaction occurring within the last 10 years: No If all of the above answers are "NO", then may proceed with Cephalosporin use.    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 x 12  yrs.   Substance abuse ([place])    crack cocaine:  extensive treatment with [facility] from 2014 to 2018 for this and alcohol abuse.   TMJ (temporomandibular joint disorder)    Past Surgical History:  Procedure Laterality Date   BREAST EXCISIONAL BIOPSY Right    BREAST LUMPECTOMY WITH NEEDLE LOCALIZATION  04/19/2012   Procedure: BREAST LUMPECTOMY WITH NEEDLE LOCALIZATION;  Surgeon: [name], MD;  Location: [place];  Service: General;  Laterality: Right;   FOOT SURGERY Left 2022   [facility]   KNEE ARTHROSCOPY W/ PARTIAL MEDIAL MENISCECTOMY  05/01/2010  right   Social History   Socioeconomic History   Marital status: Single    Spouse name: Not on file   Number of children: 2   Years of education: 12   Highest education level: High school graduate  Occupational History   Not on file  Tobacco Use   Smoking status: Former    Packs/day: 1.00    Years: 37.00    Total pack years: 37.00    Types: Cigarettes    Quit date: 12/14/2020    Years since quitting: 1.0    Passive exposure: Past   Smokeless tobacco: Never  Vaping Use   Vaping Use: Never used  Substance and Sexual Activity   Alcohol use: Not Currently    Comment: History of abuse.  Clean for 7 months dated 12/31/2021   Drug use: Not Currently    Types: Cocaine, "Crack" cocaine    Comment: Clean for 7 months dated 12/31/2021   Sexual activity: Yes    Birth control/protection: Post-menopausal    Comment: Recurrent concerns for STIs with female partner, Grayland Ormond  Other Topics Concern   Not on file  Social History Narrative   Transitional housing for substance abuse:  [place].     Not clear how long she will be there--at least 1.5 to 2 years.     Has a relationship with son and sometimes with daughter.    Looking to go to [employer] with her [facility].   Social Determinants of Health   Financial Resource Strain: Not on file  Food Insecurity: Not  on file  Transportation Needs: Not on file  Physical Activity: Not on file  Stress: Not on file  Social Connections: Not on file  Intimate Partner Violence: Not on file     Review of Systems  HENT:         Had teeth pulled and now with dentures.  Eyes:  Negative for visual disturbance (Had diabetic eye exam in April.  No reported diabetic changes.).  Respiratory:  Positive for shortness of breath (with minimal exertion.  Can also have at rest.  She was diagnosed with asthma in her 37s.  Has only used albuterol--no maintenance inhaler/corticosteroid/LABA combo per patient in past;Hx of smoking, stopping 1 year ago.).   Cardiovascular:  Positive for chest pain (Occasional sternal chest discomfort (see previous evaluation before sent to Cardiology.)  Definitely with exertion, but can also occur when lying down.), palpitations (occasional .) and leg swelling (mild at times).  Psychiatric/Behavioral:         Bipolar Disorder she feels is now well controlled off substance of abuse and taking current regimen.  Followed by [facility] 3 times weekly.  Durene Romans.        Objective:   BP 112/78 (BP Location: Right Arm, Patient Position: Sitting, Cuff Size: Normal)   Pulse 64   Resp 20   Ht 5' 3.5" (1.613 m)   Wt 246 lb (111.6 kg)   BMI 42.89 kg/m   Physical Exam Constitutional:      Appearance: She is obese.  HENT:     Head: Normocephalic and atraumatic.     Right Ear: Tympanic membrane, ear canal and external ear normal.     Left Ear: Tympanic membrane, ear canal and external ear normal.     Nose: Nose normal.     Mouth/Throat:     Mouth: Mucous membranes are moist.     Pharynx: Oropharynx is clear.     Comments:  Edentulous. Eyes:     Extraocular Movements: Extraocular movements intact.     Conjunctiva/sclera: Conjunctivae normal.     Pupils: Pupils are equal, round, and reactive to light.     Comments: Discs sharp bilaterally  Neck:     Thyroid:  Thyromegaly (Due to thick neck, difficult to assess whether nodular or smooth.) present.  Cardiovascular:     Heart sounds: S1 normal and S2 normal. No murmur heard.    No friction rub. No S3 or S4 sounds.     Comments: No carotid bruits.  Carotid, radial, femoral, DP and PT pulses normal and equal.    Pulmonary:     Effort: Pulmonary effort is normal.     Breath sounds: Normal breath sounds.  Chest:  Breasts:    Right: No inverted nipple, mass or nipple discharge.     Left: No inverted nipple, mass or nipple discharge.     Comments: Right breast with area of loss of mass where lumpectomy performed in past. Abdominal:     General: Abdomen is protuberant.     Palpations: Abdomen is soft. There is no hepatomegaly, splenomegaly or mass.     Tenderness: There is no abdominal tenderness.  Genitourinary:    Comments: Copious yellow frothy vaginal discharge with mild underlying erythema of vaginal and cervical mucosa. No CMT Musculoskeletal:     Cervical back: Normal range of motion and neck supple.     Right lower leg: No edema.     Left lower leg: No edema.  Feet:     Right foot:     Skin integrity: Skin integrity normal.     Left foot:     Skin integrity: Skin integrity normal.  Lymphadenopathy:     Head:     Right side of head: No submental or submandibular adenopathy.     Left side of head: No submental or submandibular adenopathy.     Cervical: No cervical adenopathy.     Upper Body:     Right upper body: No supraclavicular or axillary adenopathy.     Left upper body: No supraclavicular or axillary adenopathy.     Lower Body: No right inguinal adenopathy. No left inguinal adenopathy.  Skin:    General: Skin is warm.     Capillary Refill: Capillary refill takes less than 2 seconds.     Findings: No rash.  Neurological:     Mental Status: She is alert and oriented to person, place, and time.     Cranial Nerves: Cranial nerves 2-12 are intact.     Sensory: Sensation is  intact.     Motor: Motor function is intact.     Coordination: Coordination is intact.     Gait: Gait is intact.     Deep Tendon Reflexes: Reflexes are normal and symmetric.  Psychiatric:        Attention and Perception: Attention normal.        Mood and Affect: Mood normal.        Speech: Speech normal.        Behavior: Behavior normal. Behavior is cooperative.      Assessment & Plan    CPE without pap--performed in April of this year.   Mammogram completed and normal in May Referral for screening colonoscopy for after cardiac clearance and knee surgery. Hep C screen No available Shingrix today--will give second dose with follow up in 3 months. Hold on urine studies as vaginal discharge will likely provide false positive protein in urine.  Hep C screen.  2.  DM:  controlled and no longer with low glucose with removal of Glipizide.  3.  Hyperlipidemia:  at goal.  4.  Hypertension:  controlled.  5.  Exertional dyspnea and chest discomfort:  undergoing cardiac evaluation in next few days.  See OSA and asthma/COPD below.  6.  OSA:  Orders for CPAP equipment and set up to [facility].    7.  Asthma/COPD:  needs maintenance inhaler.  Advair 100/50 1 inhalation twice daily before meal then brush dentures and tongue.  To use Albuterol only as needed.    8.  Recurrent STI:  today with trichomonas vaginitis.  Encouraged her to have a conversation with her female partner and if they are okay with relations outside of their relationship, there needs to be an understanding to get checked and use protection.  He needs to get treated for trichomonas now before considering intercourse with him. Otherwise, would strongly urge her to cease relations with him as he is putting her health at risk.  Repeat GC/Chlamydia, HIV pending.    9.  Bipolar disorder:  appears to be under good control.  10.  Substance abuse:  Off drugs and alcohol now for 7 months, which has made a huge difference in her ability to  maintain her chronic health conditions.    11.  Tobacco abuse:  no smoking now for 1 year.    12.  Left knee DJD:  await clearance.  Getting her respiratory status at a better level preop with OSA and asthma issues.

## 2022-01-01 ENCOUNTER — Telehealth (HOSPITAL_COMMUNITY): Payer: Self-pay | Admitting: *Deleted

## 2022-01-01 ENCOUNTER — Ambulatory Visit (HOSPITAL_COMMUNITY): Payer: Medicaid Other | Attending: Internal Medicine

## 2022-01-01 DIAGNOSIS — R0609 Other forms of dyspnea: Secondary | ICD-10-CM | POA: Diagnosis present

## 2022-01-01 LAB — ECHOCARDIOGRAM COMPLETE
Area-P 1/2: 3.56 cm2
S' Lateral: 2.9 cm

## 2022-01-01 LAB — HEPATITIS C ANTIBODY: Hep C Virus Ab: NONREACTIVE

## 2022-01-01 LAB — HIV ANTIBODY (ROUTINE TESTING W REFLEX): HIV Screen 4th Generation wRfx: NONREACTIVE

## 2022-01-01 LAB — TSH: TSH: 0.909 u[IU]/mL (ref 0.450–4.500)

## 2022-01-01 NOTE — Telephone Encounter (Signed)
Reaching out to patient to offer assistance regarding upcoming cardiac imaging study; pt verbalizes understanding of appt date/time, parking situation and where to check in, pre-test NPO status and medications ordered, and verified current allergies; name and call back number provided for further questions should they arise  Gordy Clement RN Navigator Cardiac Imaging [facility] [phone] office [phone] cell  Patient to take daily medications.  She is aware to arrive at 2pm.

## 2022-01-02 ENCOUNTER — Ambulatory Visit (HOSPITAL_COMMUNITY)
Admission: RE | Admit: 2022-01-02 | Discharge: 2022-01-02 | Disposition: A | Discharge status: Home / Self Care | Payer: Medicaid Other | Source: Ambulatory Visit | Attending: Cardiovascular Disease | Admitting: Cardiovascular Disease

## 2022-01-02 DIAGNOSIS — R072 Precordial pain: Secondary | ICD-10-CM | POA: Diagnosis present

## 2022-01-02 MED ORDER — NITROGLYCERIN 0.4 MG SL SUBL
0.8000 mg | SUBLINGUAL_TABLET | Freq: Once | SUBLINGUAL | Status: AC
  Administered 2022-01-02: 0.8 mg via SUBLINGUAL

## 2022-01-02 MED ORDER — IOHEXOL 350 MG/ML SOLN
100.0000 mL | Freq: Once | INTRAVENOUS | Status: AC | PRN
  Administered 2022-01-02: 100 mL via INTRAVENOUS

## 2022-01-02 MED ORDER — NITROGLYCERIN 0.4 MG SL SUBL
SUBLINGUAL_TABLET | SUBLINGUAL | Status: AC
  Filled 2022-01-02: qty 2

## 2022-01-03 LAB — GC/CHLAMYDIA PROBE AMP
Chlamydia trachomatis, NAA: NEGATIVE
Neisseria Gonorrhoeae by PCR: NEGATIVE

## 2022-01-10 ENCOUNTER — Telehealth: Payer: Self-pay | Admitting: Cardiovascular Disease

## 2022-01-10 NOTE — Telephone Encounter (Signed)
Follow Up:     Patient would like her Echo and CT test results please.

## 2022-01-10 NOTE — Telephone Encounter (Signed)
Patient given and explained results of echo per Dr. Claiborne Billings.Maureen KitchenMarland Swanson"Normal LV function with EF 60 to 65%.  Moderate LVH.  Grade 1 diastolic dysfunction.  Valves essentially normal." Informed patient that Dr. Claiborne Billings has not resulted the CT procedure. Shes stated she has appointment with Dr. Claiborne Billings on 8/2.

## 2022-01-10 NOTE — Telephone Encounter (Signed)
Noted. Thanks!  Result note updated.

## 2022-01-15 ENCOUNTER — Ambulatory Visit (INDEPENDENT_AMBULATORY_CARE_PROVIDER_SITE_OTHER): Payer: Medicaid Other | Admitting: Cardiovascular Disease

## 2022-01-15 ENCOUNTER — Encounter: Payer: Self-pay | Admitting: Cardiovascular Disease

## 2022-01-15 DIAGNOSIS — G4733 Obstructive sleep apnea (adult) (pediatric): Secondary | ICD-10-CM

## 2022-01-15 DIAGNOSIS — E785 Hyperlipidemia, unspecified: Secondary | ICD-10-CM

## 2022-01-15 DIAGNOSIS — E119 Type 2 diabetes mellitus without complications: Secondary | ICD-10-CM

## 2022-01-15 DIAGNOSIS — Z01818 Encounter for other preprocedural examination: Secondary | ICD-10-CM

## 2022-01-15 DIAGNOSIS — I10 Essential (primary) hypertension: Secondary | ICD-10-CM

## 2022-01-15 DIAGNOSIS — R0609 Other forms of dyspnea: Secondary | ICD-10-CM | POA: Diagnosis not present

## 2022-01-15 DIAGNOSIS — R0789 Other chest pain: Secondary | ICD-10-CM

## 2022-01-15 NOTE — Progress Notes (Signed)
Cardiology Office Note    Date:  01/15/2022   ID:  Maureen Swanson, DOB 04/09/1966, MRN [id number]  PCP:  Mack Hook, MD Ortho: Dr. Delfino Lovett  Cardiologist:  Shelva Majestic, MD   6 week f/u cardiology evaluation initially referred through the courtesy of Dr. Jodi Marble for preoperative cardiology clearance prior to undergoing active knee replacement surgery.  History of Present Illness:  Maureen Swanson is a 56 y.o. female who is followed by Dr. Jodi Marble, MD at [facility].  She has a history of morbid obesity, diabetes mellitus, hypertension, hyperlipidemia, GERD, asthma and arthritis.  She was recently found to have severe obstructive sleep apnea with an AHI of 100.7/h on a diagnostic polysomnogram on October 09, 2021 interpreted by Dr. Annamaria Boots and had follow-up CPAP titration is to initiate CPAP therapy next week.  Ms. Rinks has seen Dr. Lyla Glassing for left knee discomfort and will require surgery.  She has had issues with frequent shortness of breath, with significant shortness of breath walking to her mailbox, as well as some vague chest pressure sensation which can be nonexertional in precipitation.  She admits to a history of hypertension in excess of 40 years, diabetes mellitus for at least 20 years, for the past several years has been on lipid-lowering therapy with atorvastatin 40 mg.  Most recently she is on atenolol 100 mg daily, amlodipine 10 mg daily, HCTZ 25 mg in addition to losartan 100 mg daily for hypertension control. She has a history of depression and is on Abilify and has neuropathy on gabapentin.  She has a prescription for albuterol to take on an as-needed basis for her asthma.  She is diabetic on metformin and is on omeprazole for GERD.  I saw her on December 11, 2021 for preoperative cardiology evaluation prior to undergoing knee surgery.  At that time, I recommended further evaluation and particularly with her significant hypertensive history  recommended she undergo a 2D echo Doppler study.  With her cardiovascular risk factors and symptoms of vague chest discomfort I recommended that she undergo coronary CTA to assess both calcium score as well as percent of luminal stenosis if CAD is present for preoperative assessment.   Her 2D echo Doppler study was done on January 01, 2022 and showed normal LV function with EF 60 to 65%.  There was moderate LVH with grade 1 diastolic dysfunction.  The tricuspid valve was not well visualized but all other valves appeared normal.  Her coronary CTA study was done on January 02, 2022.  This revealed a calcium score of 0.  She did not have any obstructive disease.  Presently, Maureen Swanson feels well.  She denies any recent chest pain.  She continues to be on her multidrug regimen of amlodipine 10 mg, atenolol 100 mg, HCTZ 25 mg, and losartan 100 mg daily for blood pressure control.  She is on atorvastatin 40 mg for lipid-lowering therapy.  She is on Abilify for depression and takes as needed albuterol for wheezing.  She completed her CPAP titration study interpreted by Dr. Annamaria Boots and will be initiating CPAP therapy with optimal pressure at 16 cm of water next week.  She presents for follow-up evaluation.  Past Medical History:  Diagnosis Date   Anxiety    Arthritis    back and knees   Asthma    daily and prn inhalers   Atypical ductal hyperplasia of breast 03/2012   right   Bipolar 1 disorder ([place])  CHF (congestive heart failure) (HCC)    Depression    Diabetes mellitus    diet-controlled   Gastric ulcer    GERD (gastroesophageal reflux disease)    Gout    Headache(784.0)    migraines   High cholesterol    Hypertension    under control, has been on med. x 12 yrs.   Substance abuse ([place])    crack cocaine:  extensive treatment with [facility] from 2014 to 2018 for this and alcohol abuse.   TMJ (temporomandibular joint disorder)     Past Surgical History:   Procedure Laterality Date   BREAST EXCISIONAL BIOPSY Right    BREAST LUMPECTOMY WITH NEEDLE LOCALIZATION  04/19/2012   Procedure: BREAST LUMPECTOMY WITH NEEDLE LOCALIZATION;  Surgeon: [name], MD;  Location: [facility];  Service: General;  Laterality: Right;   FOOT SURGERY Left 2022   [facility]   KNEE ARTHROSCOPY W/ PARTIAL MEDIAL MENISCECTOMY  05/01/2010   right    Current Medications: Outpatient Medications Prior to Visit  Medication Sig Dispense Refill   ABILIFY MAINTENA 400 MG PRSY prefilled syringe Inject 400 mg into the muscle every 30 (thirty) days. Last injection given on 01/10/2021. Next injection due 02/07/2021 1 each    Accu-Chek Softclix Lancets lancets Check blood glucose twice daily before meals 100 each 12   albuterol (VENTOLIN HFA) 108 (90 Base) MCG/ACT inhaler Inhale 2 puffs into the lungs every 6 (six) hours as needed for wheezing or shortness of breath. 18 g 4   amLODipine (NORVASC) 10 MG tablet 1 tab by mouth daily     atenolol (TENORMIN) 100 MG tablet Take 1 tablet (100 mg total) by mouth daily. 30 tablet 11   atorvastatin (LIPITOR) 40 MG tablet Take 1 tablet (40 mg total) by mouth at bedtime. 30 tablet 0   Blood Glucose Monitoring Suppl (ACCU-CHEK AVIVA PLUS) w/Device KIT Check blood glucose twice daily before meals 1 kit 0   Blood Pressure Monitoring (BLOOD PRESSURE KIT) DEVI Check blood pressure 3 times weekly 1 each 0   cetirizine (ZYRTEC) 10 MG tablet Take 1 tablet (10 mg total) by mouth daily as needed for allergies. 30 tablet 11   diclofenac Sodium (VOLTAREN) 1 % GEL Apply 2g to knees up to 4 times daily as needed for pain 100 g 6   fluticasone-salmeterol (ADVAIR) 100-50 MCG/ACT AEPB Inhale 1 puff into the lungs 2 (two) times daily. 1 each 11   gabapentin (NEURONTIN) 300 MG capsule Take 1 capsule (300 mg total) by mouth 3 (three) times daily. 90 capsule 0   glucose blood (ACCU-CHEK AVIVA PLUS) test strip Check blood glucose twice  daily before meals 100 each 12   hydrochlorothiazide (HYDRODIURIL) 25 MG tablet 1 tab by mouth with morning meal and Losartan daily 30 tablet 11   ibuprofen (ADVIL) 800 MG tablet 1 tab by mouth twice daily with meals as needed. 60 tablet 4   lamoTRIgine (LAMICTAL) 100 MG tablet Take 100 mg by mouth daily.     losartan (COZAAR) 100 MG tablet 1 tab by mouth with HCTZ in morning daily. 30 tablet 11   melatonin 3 MG TABS tablet Take 1 tablet (3 mg total) by mouth at bedtime. 30 tablet 0   metFORMIN (GLUCOPHAGE) 1000 MG tablet 1 tab by mouth twice daily with meals 60 tablet 11   metroNIDAZOLE (FLAGYL) 500 MG tablet 1 tab by mouth twice daily with meals for 7  days. 14 tablet 0   omeprazole (PRILOSEC) 40 MG capsule 1 cap by mouth in morning 1/2 hour before breakfast and other meds 30 capsule 11   traZODone (DESYREL) 150 MG tablet Take 400 mg by mouth at bedtime.     No facility-administered medications prior to visit.     Allergies:   Chocolate, Orange, Penicillins, Strawberry extract, and Tomato   Social History   Socioeconomic History   Marital status: Single    Spouse name: Not on file   Number of children: 2   Years of education: 12   Highest education level: High school graduate  Occupational History   Not on file  Tobacco Use   Smoking status: Former    Packs/day: 1.00    Years: 37.00    Total pack years: 37.00    Types: Cigarettes    Quit date: 12/14/2020    Years since quitting: 1.0    Passive exposure: Past   Smokeless tobacco: Never  Vaping Use   Vaping Use: Never used  Substance and Sexual Activity   Alcohol use: Not Currently    Comment: History of abuse.  Clean for 7 months dated 12/31/2021   Drug use: Not Currently    Types: Cocaine, "Crack" cocaine    Comment: Clean for 7 months dated 12/31/2021   Sexual activity: Yes    Birth control/protection: Post-menopausal    Comment: Recurrent concerns for STIs with female partner, Grayland Ormond  Other Topics Concern   Not on file   Social History Narrative   Transitional housing for substance abuse:  [place].     Not clear how long she will be there--at least 1.5 to 2 years.     Has a relationship with son and sometimes with daughter.    Looking to go to [employer] with her [employer].   Social Determinants of Health   Financial Resource Strain: Not on file  Food Insecurity: Not on file  Transportation Needs: Not on file  Physical Activity: Not on file  Stress: Not on file  Social Connections: Not on file     Additional social history is notable and that she was born in [place].  She is single.  She has 2 children and 6 grandchildren.  She is disabled.  She completed 12 grade of education.  There is no tobacco or alcohol use.  She does walk 3 days/week.  Family History:  The patient's family history includes Alcoholism in her maternal uncle; Bipolar disorder in her maternal aunt; Breast cancer in her mother; Cancer in her father; Diabetes in her mother; Multiple sclerosis in her sister; Schizophrenia in her maternal grandfather.   Family history is notable that both parents are deceased.  Mother died at age 73 and had diabetes, heart disease and stroke.  Father died at age 74 with cancer.  She has a brother age 30 and a sister age 49 who are alive.  She has a son age 27 and a daughter age 42.  ROS General: Negative; No fevers, chills, or night sweats; morbid obese HEENT: Negative; No changes in vision or hearing, sinus congestion, difficulty swallowing Pulmonary: Negative; No cough, wheezing, shortness of breath, hemoptysis Cardiovascular: See HPI GI: Negative; No nausea, vomiting, diarrhea, or abdominal pain GU: Negative; No dysuria, hematuria, or difficulty voiding Musculoskeletal: Left knee discomfort Hematologic/Oncology: Negative; no easy bruising, bleeding Endocrine: Diabetes mellitus Neuro: Negative; no changes in balance, headaches Skin: Negative; No rashes or skin  lesions Psychiatric: Positive for depression Sleep: Very  severe OSA noted on polysomnogram, for CPAP titration study this evening.   Other comprehensive 14 point system review is negative.   PHYSICAL EXAM:   VS:  BP 120/78   Pulse 89   Ht '5\' 4"'  (1.626 m)   Wt 242 lb (109.8 kg)   SpO2 94%   BMI 41.54 kg/m     Repeat blood pressure by me was 120/80  Wt Readings from Last 3 Encounters:  01/15/22 242 lb (109.8 kg)  12/31/21 246 lb (111.6 kg)  12/11/21 245 lb (111.1 kg)    General: Alert, oriented, no distress.  Morbid obesity Skin: normal turgor, no rashes, warm and dry HEENT: Normocephalic, atraumatic. Pupils equal round and reactive to light; sclera anicteric; extraocular muscles intact;  Nose without nasal septal hypertrophy Mouth/Parynx benign; Mallinpatti scale 3/4 Neck: Thick neck; no JVD, no carotid bruits; normal carotid upstroke Lungs: clear to ausculatation and percussion; no wheezing or rales Chest wall: without tenderness to palpitation Heart: PMI not displaced, RRR, s1 s2 normal, 1/6 systolic murmur, no diastolic murmur, no rubs, gallops, thrills, or heaves Abdomen: soft, nontender; no hepatosplenomehaly, BS+; abdominal aorta nontender and not dilated by palpation. Back: no CVA tenderness Pulses 2+ Musculoskeletal: full range of motion, normal strength, no joint deformities Extremities: no clubbing cyanosis or edema, Homan's sign negative  Neurologic: grossly nonfocal; Cranial nerves grossly wnl Psychologic: Normal mood and affect     Studies/Labs Reviewed:   January 15, 2022 ECG (independently read by me): Normal sinus rhythm at 89.  LVH by voltage.  Nonspecific T wave abnormality.  December 11, 2021 ECG (independently read by me): Normal sinus rhythm at 77 bpm.  Moderate LVH by voltage criteria.  Specific inferolateral T wave abnormality.  Normal intervals  Recent Labs:    Latest Ref Rng & Units 12/13/2021   11:54 AM 10/25/2021    9:47 AM 07/02/2021    9:43 AM   BMP  Glucose 70 - 99 mg/dL 140  127  95   BUN 6 - 24 mg/dL '23  20  21   ' Creatinine 0.57 - 1.00 mg/dL 1.30  0.99  0.86   BUN/Creat Ratio 9 - '23 18  20  24   ' Sodium 134 - 144 mmol/L 140  140  144   Potassium 3.5 - 5.2 mmol/L 4.0  4.1  4.3   Chloride 96 - 106 mmol/L 101  99  102   CO2 20 - 29 mmol/L '22  26  22   ' Calcium 8.7 - 10.2 mg/dL 9.4  9.8  9.6         Latest Ref Rng & Units 07/02/2021    9:43 AM 06/23/2021    1:24 PM 05/29/2021    4:32 PM  Hepatic Function  Total Protein 6.0 - 8.5 g/dL 7.0  7.1  6.9   Albumin 3.8 - 4.9 g/dL 4.4  3.7  3.6   AST 0 - 40 IU/L 44  51  20   ALT 0 - 32 IU/L 72  72  17   Alk Phosphatase 44 - 121 IU/L 81  72  64   Total Bilirubin 0.0 - 1.2 mg/dL 0.3  0.5  0.5        Latest Ref Rng & Units 06/23/2021   12:11 PM 05/29/2021    4:32 PM 01/01/2021   10:22 AM  CBC  WBC 4.0 - 10.5 K/uL 6.2  7.7  6.8   Hemoglobin 12.0 - 15.0 g/dL 11.1  13.1  11.8   Hematocrit  36.0 - 46.0 % 35.6  40.9  38.0   Platelets 150 - 400 K/uL 178  199  184    Lab Results  Component Value Date   MCV 94.7 06/23/2021   MCV 91.9 05/29/2021   MCV 92.5 01/01/2021   Lab Results  Component Value Date   TSH 0.909 12/31/2021   Lab Results  Component Value Date   HGBA1C 6.8 (H) 12/10/2021     BNP    Component Value Date/Time   BNP 68.2 10/13/2014 1912    ProBNP    Component Value Date/Time   PROBNP <30.0 10/30/2008 1300     Lipid Panel     Component Value Date/Time   CHOL 145 08/15/2021 0945   TRIG 87 08/15/2021 0945   HDL 65 08/15/2021 0945   CHOLHDL 3.1 05/29/2021 1632   VLDL 33 05/29/2021 1632   LDLCALC 64 08/15/2021 0945   LABVLDL 16 08/15/2021 0945     RADIOLOGY: CT CORONARY MORPH W/CTA COR W/SCORE W/CA W/CM &/OR WO/CM  Addendum Date: 01/05/2022   ADDENDUM REPORT: 01/05/2022 22:26 CLINICAL DATA:  Chest pain EXAM: Cardiac/Coronary CTA TECHNIQUE: A non-contrast, gated CT scan was obtained with axial slices of 3 mm through the heart for calcium scoring.  Calcium scoring was performed using the Agatston method. A 120 kV prospective, gated, contrast cardiac scan was obtained. Gantry rotation speed was 250 msecs and collimation was 0.6 mm. Two sublingual nitroglycerin tablets (0.8 mg) were given. The 3D data set was reconstructed in 5% intervals of the 35-75% of the R-R cycle. Diastolic phases were analyzed on a dedicated workstation using MPR, MIP, and VRT modes. The patient received 95 cc of contrast. FINDINGS: Image quality: Excellent. Noise artifact is: Limited. Coronary Arteries:  Normal coronary origin.  Right dominance. Left main: The left main is a large caliber vessel with a normal take off from the left coronary cusp that bifurcates to form a left anterior descending artery and a left circumflex artery. trifurcates into a LAD, LCX, and ramus intermedius. There is no plaque or stenosis. Left anterior descending artery: The LAD is patent without evidence of plaque or stenosis. The LAD gives off 2 patent diagonal branches. Ramus intermedius: Patent with no evidence of plaque or stenosis. Left circumflex artery: The LCX is non-dominant and patent with no evidence of plaque or stenosis. The LCX gives off 2 patent obtuse marginal branches. Right coronary artery: The RCA is dominant with normal take off from the right coronary cusp. There is no evidence of plaque or stenosis. The RCA terminates as a PDA and right posterolateral branch without evidence of plaque or stenosis. Right Atrium: Right atrial size is within normal limits. Right Ventricle: The right ventricular cavity is within normal limits. Left Atrium: Left atrial size is normal in size with no left atrial appendage filling defect. Left Ventricle: The ventricular cavity size is within normal limits. There are no stigmata of prior infarction. There is no abnormal filling defect. Pulmonary arteries: Normal in size without proximal filling defect. Pulmonary veins: Normal pulmonary venous drainage.  Pericardium: Normal thickness with no significant effusion or calcium present. Cardiac valves: The aortic valve is trileaflet without significant calcification. The mitral valve is normal structure without significant calcification. Aorta: Normal caliber with no significant disease. Extra-cardiac findings: See attached radiology report for non-cardiac structures. IMPRESSION: 1. Coronary calcium score of 0. This was 0 percentile for age-, sex, and race-matched controls. 2.  Normal coronary origin with right dominance. 3.  Normal coronary arteries.  CAD RADS 0. 4.  Consider non atherosclerotic causes of chest pain. RECOMMENDATIONS: 1. CAD-RADS 0: No evidence of CAD (0%). Consider non-atherosclerotic causes of chest pain. 2. CAD-RADS 1: Minimal non-obstructive CAD (0-24%). Consider non-atherosclerotic causes of chest pain. Consider preventive therapy and risk factor modification. 3. CAD-RADS 2: Mild non-obstructive CAD (25-49%). Consider non-atherosclerotic causes of chest pain. Consider preventive therapy and risk factor modification. 4. CAD-RADS 3: Moderate stenosis. Consider symptom-guided anti-ischemic pharmacotherapy as well as risk factor modification per guideline directed care. Additional analysis with CT FFR will be submitted. 5. CAD-RADS 4: Severe stenosis. (70-99% or > 50% left main). Cardiac catheterization or CT FFR is recommended. Consider symptom-guided anti-ischemic pharmacotherapy as well as risk factor modification per guideline directed care. Invasive coronary angiography recommended with revascularization per published guideline statements. 6. CAD-RADS 5: Total coronary occlusion (100%). Consider cardiac catheterization or viability assessment. Consider symptom-guided anti-ischemic pharmacotherapy as well as risk factor modification per guideline directed care. 7. CAD-RADS N: Non-diagnostic study. Obstructive CAD can't be excluded. Alternative evaluation is recommended. Fransico Him, MD  Electronically Signed   By: Fransico Him M.D.   On: 01/05/2022 22:26   Result Date: 01/05/2022 EXAM: OVER-READ INTERPRETATION  CT CHEST The following report is a limited chest CT over-read performed by radiologist Dr. Rolm Baptise of [facility], [place] on 01/02/2022. This over-read does not include interpretation of cardiac or coronary anatomy or pathology. The coronary CTA interpretation by the cardiologist is attached. COMPARISON:  08/03/2016 FINDINGS: Vascular: Heart is normal size.  Aorta normal caliber. Mediastinum/Nodes: No adenopathy Lungs/Pleura: No confluent opacity or effusion. Upper Abdomen: No acute findings Musculoskeletal: Chest wall soft tissues are unremarkable. No acute bony abnormality. IMPRESSION: No acute or significant extracardiac abnormality. Electronically Signed: By: Rolm Baptise M.D. On: 01/02/2022 16:14   ECHOCARDIOGRAM COMPLETE  Result Date: 01/01/2022    ECHOCARDIOGRAM REPORT   Patient Name:   Maureen Swanson  Date of Exam: 01/01/2022 Medical Rec #:  [id number]     Height:       63.5 in Accession #:    [id number]    Weight:       246.0 lb Date of Birth:  11/26/1965     BSA:          2.124 m Patient Age:    61 years      BP:           112/78 mmHg Patient Gender: F             HR:           64 bpm. Exam Location:  [place] Procedure: 2D Echo, Cardiac Doppler and Color Doppler Indications:    R06.02 SOB  History:        Patient has no prior history of Echocardiogram examinations.                 CHF, Stroke; Risk Factors:Hypertension, Diabetes, Dyslipidemia,                 Sleep Apnea and Former Smoker. Asthma.  Sonographer:    Diamond Nickel RCS Referring Phys: Troy Sine IMPRESSIONS  1. Left ventricular ejection fraction, by estimation, is 60 to 65%. The left ventricle has normal function. The left ventricle has no regional wall motion abnormalities. There is moderate left ventricular hypertrophy. Left ventricular diastolic parameters are consistent with Grade I diastolic  dysfunction (impaired relaxation).  2. Right ventricular systolic function is normal. The right ventricular size is normal. Tricuspid regurgitation signal is  inadequate for assessing PA pressure.  3. The mitral valve is normal in structure. No evidence of mitral valve regurgitation.  4. The aortic valve is tricuspid. Aortic valve regurgitation is not visualized.  5. The inferior vena cava is normal in size with greater than 50% respiratory variability, suggesting right atrial pressure of 3 mmHg. FINDINGS  Left Ventricle: Left ventricular ejection fraction, by estimation, is 60 to 65%. The left ventricle has normal function. The left ventricle has no regional wall motion abnormalities. The left ventricular internal cavity size was normal in size. There is  moderate left ventricular hypertrophy. Left ventricular diastolic parameters are consistent with Grade I diastolic dysfunction (impaired relaxation). Right Ventricle: The right ventricular size is normal. No increase in right ventricular wall thickness. Right ventricular systolic function is normal. Tricuspid regurgitation signal is inadequate for assessing PA pressure. Left Atrium: Left atrial size was normal in size. Right Atrium: Right atrial size was normal in size. Pericardium: There is no evidence of pericardial effusion. Mitral Valve: The mitral valve is normal in structure. No evidence of mitral valve regurgitation. Tricuspid Valve: The tricuspid valve is normal in structure. Tricuspid valve regurgitation is not demonstrated. Aortic Valve: The aortic valve is tricuspid. Aortic valve regurgitation is not visualized. Pulmonic Valve: The pulmonic valve was normal in structure. Pulmonic valve regurgitation is not visualized. Aorta: The aortic root and ascending aorta are structurally normal, with no evidence of dilitation. Venous: The inferior vena cava is normal in size with greater than 50% respiratory variability, suggesting right atrial pressure of 3 mmHg.  IAS/Shunts: No atrial level shunt detected by color flow Doppler.  LEFT VENTRICLE PLAX 2D LVIDd:         4.40 cm   Diastology LVIDs:         2.90 cm   LV e' medial:    8.49 cm/s LV PW:         1.20 cm   LV E/e' medial:  8.3 LV IVS:        1.40 cm   LV e' lateral:   9.46 cm/s LVOT diam:     2.00 cm   LV E/e' lateral: 7.4 LV SV:         76 LV SV Index:   36 LVOT Area:     3.14 cm  RIGHT VENTRICLE RV Basal diam:  3.00 cm RV S prime:     10.40 cm/s TAPSE (M-mode): 1.4 cm LEFT ATRIUM             Index        RIGHT ATRIUM           Index LA diam:        3.80 cm 1.79 cm/m   RA Area:     13.30 cm LA Vol (A2C):   32.3 ml 15.21 ml/m  RA Volume:   32.50 ml  15.30 ml/m LA Vol (A4C):   34.4 ml 16.20 ml/m LA Biplane Vol: 34.8 ml 16.38 ml/m  AORTIC VALVE LVOT Vmax:   130.00 cm/s LVOT Vmean:  87.800 cm/s LVOT VTI:    0.243 m  AORTA Ao Root diam: 3.10 cm Ao Asc diam:  3.30 cm MITRAL VALVE MV Area (PHT): 3.56 cm    SHUNTS MV Decel Time: 213 msec    Systemic VTI:  0.24 m MV E velocity: 70.20 cm/s  Systemic Diam: 2.00 cm MV A velocity: 91.70 cm/s MV E/A ratio:  0.77 Landscape architect signed by Phineas Inches Signature Date/Time: 01/01/2022/1:41:55 PM  Final      Additional studies/ records that were reviewed today include:  I have reviewed the records from Dr. Mack Hook from [facility].  I have reviewed the polysomnogram dated October 09, 2021 interpreted by Dr. Annamaria Boots.  ASSESSMENT:    1. Preoperative clearance   2. DOE (dyspnea on exertion)   3. Essential hypertension   4. Severe OSA (obstructive sleep apnea)   5. Atypical chest pain   6. Type 2 diabetes mellitus without complication, without long-term current use of insulin (HCC)   7. Hyperlipidemia with target LDL less than 70   8. Morbid obesity [facility])     PLAN:  [name] is a 56 year old African-American female who has a history of morbid obesity significant cardiovascular risk factors including at least a 40-year  history of hypertension, 20-year history of diabetes mellitus, hyperlipidemia, and has had issues with GERD, asthma, arthritis, and depression.  He was recently diagnosed with severe sleep apnea with AHI in excess of 100 and underwent follow-up CPAP titration evaluation.  She is scheduled to initiate CPAP therapy next week and this will be followed by Dr. Annamaria Boots.  When I saw her for my initial cardiology evaluation on December 11, 2021 he admitted to episodes of shortness of breath which was worse with exercise.  She denied any definitive substernal chest tightness but at times had a stinging atypical chest pressure which was nonexertional.  I reviewed her 2D echo Doppler study which shows normal LV systolic function.  She had moderate left ventricular hypertrophy with grade 1 diastolic dysfunction.  There was no significant valvular abnormalities although her tricuspid valve was poorly visualized.  With her chest pain symptomatology and cardiovascular risk factors she underwent coronary CTA which was excellent.  Calcium score is 0.  There was no evidence for CAD.  Her blood pressure today is stable on current therapy.  She has lost 6 pounds since her initial evaluation.  BMI is consistent with morbid obesity at 41.5.  She will be given clearance to undergo her elective knee surgery to be done by Dr. Lyla Glassing.  She will be initiating CPAP therapy next week.  Previous evaluation I had an extensive discussion with her regarding potential adverse consequences of untreated sleep apnea particularly with reference to her cardiovascular health.  She is on atorvastatin 40 mg for hyperlipidemia with LDL cholesterol at 64 on August 15, 2021.  Most recent hemoglobin A1c was 6.8 on December 10, 2021.  She will follow-up with Dr. Mack Hook.  I will be available as needed in the future if problems arise.    Medication Adjustments/Labs and Tests Ordered: Current medicines are reviewed at length with the patient today.  Concerns  regarding medicines are outlined above.  Medication changes, Labs and Tests ordered today are listed in the Patient Instructions below. Patient Instructions  Medication Instructions:  Your Physician recommend you continue on your current medication as directed.    *If you need a refill on your cardiac medications before your next appointment, please call your pharmacy*   Lab Work: None ordered today   Testing/Procedures: None ordered today   Follow-Up: At [facility], you and your health needs are our priority.  As part of our continuing mission to provide you with exceptional heart care, we have created designated Provider Care Teams.  These Care Teams include your primary Cardiologist (physician) and Advanced Practice Providers (APPs -  Physician Assistants and Nurse Practitioners) who all work together to provide you with  the care you need, when you need it.  We recommend signing up for the patient portal called "MyChart".  Sign up information is provided on this After Visit Summary.  MyChart is used to connect with patients for Virtual Visits (Telemedicine).  Patients are able to view lab/test results, encounter notes, upcoming appointments, etc.  Non-urgent messages can be sent to your provider as well.   To learn more about what you can do with MyChart, go to [email or web address].    Your next appointment:   As needed  The format for your next appointment:   In Person  Provider:   Dr. Claiborne Billings  Per Dr. Claiborne Billings, ok to proceed with surgery.         Signed, Shelva Majestic, MD  01/15/2022 1:46 PM    [facility] [address], [place], [place], [place]  [address] Phone: [phone]

## 2022-01-15 NOTE — Patient Instructions (Addendum)
Medication Instructions:  Your Physician recommend you continue on your current medication as directed.    *If you need a refill on your cardiac medications before your next appointment, please call your pharmacy*   Lab Work: None ordered today   Testing/Procedures: None ordered today   Follow-Up: At [facility], you and your health needs are our priority.  As part of our continuing mission to provide you with exceptional heart care, we have created designated Provider Care Teams.  These Care Teams include your primary Cardiologist (physician) and Advanced Practice Providers (APPs -  Physician Assistants and Nurse Practitioners) who all work together to provide you with the care you need, when you need it.  We recommend signing up for the patient portal called "MyChart".  Sign up information is provided on this After Visit Summary.  MyChart is used to connect with patients for Virtual Visits (Telemedicine).  Patients are able to view lab/test results, encounter notes, upcoming appointments, etc.  Non-urgent messages can be sent to your provider as well.   To learn more about what you can do with MyChart, go to [email or web address].    Your next appointment:   As needed  The format for your next appointment:   In Person  Provider:   Dr. Claiborne Billings  Per Dr. Claiborne Billings, ok to proceed with surgery.

## 2022-01-22 ENCOUNTER — Other Ambulatory Visit: Payer: Medicaid Other | Admitting: Internal Medicine

## 2022-01-23 ENCOUNTER — Other Ambulatory Visit: Payer: Medicaid Other

## 2022-01-26 ENCOUNTER — Ambulatory Visit: Payer: Self-pay | Admitting: Student

## 2022-01-29 ENCOUNTER — Ambulatory Visit: Payer: Self-pay | Admitting: Student

## 2022-01-29 ENCOUNTER — Ambulatory Visit: Payer: Medicaid Other | Admitting: Internal Medicine

## 2022-01-29 DIAGNOSIS — E119 Type 2 diabetes mellitus without complications: Secondary | ICD-10-CM

## 2022-02-06 ENCOUNTER — Ambulatory Visit: Payer: Self-pay | Admitting: Student

## 2022-02-06 NOTE — H&P (Signed)
TOTAL KNEE ADMISSION H&P  Patient is being admitted for left total knee arthroplasty.  Subjective:  Chief Complaint:left knee pain.  HPI: Maureen Swanson, 56 y.o. female, has a history of pain and functional disability in the left knee due to arthritis and has failed non-surgical conservative treatments for greater than 12 weeks to includeNSAID's and/or analgesics, corticosteriod injections, flexibility and strengthening excercises, supervised PT with diminished ADL's post treatment, use of assistive devices, and activity modification.  Onset of symptoms was gradual, starting 10 years ago with rapidlly worsening course since that time. The patient noted prior procedures on the knee to include  arthroscopy on the left knee(s).  Patient currently rates pain in the left knee(s) at 10 out of 10 with activity. Patient has night pain, worsening of pain with activity and weight bearing, pain that interferes with activities of daily living, pain with passive range of motion, and joint swelling.  Patient has evidence of subchondral cysts, subchondral sclerosis, periarticular osteophytes, and joint space narrowing by imaging studies. There is no active infection.  Patient Active Problem List   Diagnosis Date Noted   OSA (obstructive sleep apnea) 12/31/2021   Acute gout of left foot 12/06/2019   CHF (congestive heart failure) ([place]) 08/03/2016   Cannabis use disorder, moderate, dependence ([place]) 11/20/2015   Homicidal ideation    Tobacco use disorder 07/20/2015   Diabetes mellitus ([place]) 07/20/2015   Cocaine use disorder, moderate, dependence ([place]) 04/03/2015   Schizoaffective disorder, bipolar type ([place]) 04/03/2015   Alcohol use disorder, moderate, dependence ([place]) 04/03/2015   Acute ischemic stroke ([place]) 12/08/2014   Gout attack 11/12/2014   Unilateral primary osteoarthritis, right knee 11/08/2014   Diabetic peripheral neuropathy ([place]) 11/04/2014   Mixed hyperlipidemia 11/04/2014   Small vessel disease,  cerebrovascular 11/04/2014   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   Cocaine abuse ([place]) 02/19/2012   Knee pain 10/17/2010   MAMMOGRAM, ABNORMAL, RIGHT 08/05/2010   AMENORRHEA, SECONDARY 10/29/2009   HERPES ZOSTER 08/20/2009   Trichomonas vaginitis 01/26/2009   GERD 10/11/2007   Essential hypertension 02/26/2007   Past Medical History:  Diagnosis Date   Anxiety    Arthritis    back and knees   Asthma    daily and prn inhalers   Atypical ductal hyperplasia of breast 03/2012   right   Bipolar 1 disorder ([place])    CHF (congestive heart failure) ([place])    Depression    Diabetes mellitus    diet-controlled   Gastric ulcer    GERD (gastroesophageal reflux disease)    Gout    Headache(784.0)    migraines   High cholesterol    Hypertension    under control, has been on med. x 12 yrs.   Substance abuse ([place])    crack cocaine:  extensive treatment with [facility] from 2014 to 2018 for this and alcohol abuse.   TMJ (temporomandibular joint disorder)     Past Surgical History:  Procedure Laterality Date   BREAST EXCISIONAL BIOPSY Right    BREAST LUMPECTOMY WITH NEEDLE LOCALIZATION  04/19/2012   Procedure: BREAST LUMPECTOMY WITH NEEDLE LOCALIZATION;  Surgeon: [name], MD;  Location: [facility];  Service: General;  Laterality: Right;   FOOT SURGERY Left 2022   [facility]   KNEE ARTHROSCOPY W/ PARTIAL MEDIAL MENISCECTOMY  05/01/2010   right    Current Outpatient Medications  Medication  Sig Dispense Refill Last Dose   ABILIFY MAINTENA 400 MG PRSY prefilled syringe Inject 400 mg into the muscle every 30 (thirty) days. Last injection given on 01/10/2021. Next injection due 02/07/2021 1 each     Accu-Chek Softclix Lancets lancets Check blood glucose twice daily before meals 100 each 12    albuterol (VENTOLIN HFA) 108 (90 Base) MCG/ACT inhaler Inhale 2  puffs into the lungs every 6 (six) hours as needed for wheezing or shortness of breath. 18 g 4    amLODipine (NORVASC) 10 MG tablet 1 tab by mouth daily      atenolol (TENORMIN) 100 MG tablet Take 1 tablet (100 mg total) by mouth daily. 30 tablet 11    atorvastatin (LIPITOR) 40 MG tablet Take 1 tablet (40 mg total) by mouth at bedtime. 30 tablet 0    benztropine (COGENTIN) 1 MG tablet Take 1 mg by mouth at bedtime.      Blood Glucose Monitoring Suppl (ACCU-CHEK AVIVA PLUS) w/Device KIT Check blood glucose twice daily before meals 1 kit 0    Blood Pressure Monitoring (BLOOD PRESSURE KIT) DEVI Check blood pressure 3 times weekly 1 each 0    celecoxib (CELEBREX) 200 MG capsule Take 200 mg by mouth 2 (two) times daily.      cetirizine (ZYRTEC) 10 MG tablet Take 1 tablet (10 mg total) by mouth daily as needed for allergies. (Patient taking differently: Take 10 mg by mouth daily.) 30 tablet 11    cyclobenzaprine (FLEXERIL) 5 MG tablet Take 5 mg by mouth 3 (three) times daily as needed for muscle spasms.      diclofenac Sodium (VOLTAREN) 1 % GEL Apply 2g to knees up to 4 times daily as needed for pain 100 g 6    fluticasone-salmeterol (ADVAIR) 100-50 MCG/ACT AEPB Inhale 1 puff into the lungs 2 (two) times daily. (Patient taking differently: Inhale 1 puff into the lungs 2 (two) times daily as needed (shortness of breath).) 1 each 11    gabapentin (NEURONTIN) 300 MG capsule Take 1 capsule (300 mg total) by mouth 3 (three) times daily. 90 capsule 0    glucose blood (ACCU-CHEK AVIVA PLUS) test strip Check blood glucose twice daily before meals 100 each 12    hydrochlorothiazide (HYDRODIURIL) 25 MG tablet 1 tab by mouth with morning meal and Losartan daily 30 tablet 11    ibuprofen (ADVIL) 800 MG tablet 1 tab by mouth twice daily with meals as needed. (Patient not taking: Reported on 02/05/2022) 60 tablet 4    lamoTRIgine (LAMICTAL) 100 MG tablet Take 100 mg by mouth daily.      losartan (COZAAR) 100 MG tablet 1  tab by mouth with HCTZ in morning daily. 30 tablet 11    melatonin 3 MG TABS tablet Take 1 tablet (3 mg total) by mouth at bedtime. 30 tablet 0    metFORMIN (GLUCOPHAGE) 1000 MG tablet 1 tab by mouth twice daily with meals 60 tablet 11    metroNIDAZOLE (FLAGYL) 500 MG tablet 1 tab by mouth twice daily with meals for 7 days. (Patient not taking: Reported on 02/05/2022) 14 tablet 0    omeprazole (PRILOSEC) 40 MG capsule 1 cap by mouth in morning 1/2 hour before breakfast and other meds 30 capsule 11    prazosin (MINIPRESS) 1 MG capsule Take 1 mg by mouth at bedtime.      traZODone (DESYREL) 100 MG tablet Take 400 mg by mouth at bedtime.      No current facility-administered  medications for this visit.   Allergies  Allergen Reactions   Chocolate Hives   Orange Hives and Other (See Comments)    "Acid foods"   Penicillins Hives and Other (See Comments)    Has patient had a PCN reaction causing immediate rash, facial/tongue/throat swelling, SOB or lightheadedness with hypotension: Yes Has patient had a PCN reaction causing severe rash involving mucus membranes or skin necrosis: Yes Has patient had a PCN reaction that required hospitalization No Has patient had a PCN reaction occurring within the last 10 years: No If all of the above answers are "NO", then may proceed with Cephalosporin use.    Strawberry Extract Swelling   Tomato Hives and Other (See Comments)    "acid foods"    Social History   Tobacco Use   Smoking status: Former    Packs/day: 1.00    Years: 37.00    Total pack years: 37.00    Types: Cigarettes    Quit date: 12/14/2020    Years since quitting: 1.1    Passive exposure: Past   Smokeless tobacco: Never  Substance Use Topics   Alcohol use: Not Currently    Comment: History of abuse.  Clean for 7 months dated 12/31/2021    Family History  Problem Relation Age of Onset   Diabetes Mother    Breast cancer Mother        She is not sure about this diagnosis   Cancer  Father        Laryngeal   Multiple sclerosis Sister        not clear if this is her diagnosis   Bipolar disorder Maternal Aunt    Alcoholism Maternal Uncle    Schizophrenia Maternal Grandfather      Review of Systems  Musculoskeletal:  Positive for arthralgias, gait problem, joint swelling and myalgias.  All other systems reviewed and are negative.   Objective:  Physical Exam Constitutional:      Appearance: Normal appearance. She is obese.  HENT:     Head: Normocephalic and atraumatic.     Nose: Nose normal.     Mouth/Throat:     Mouth: Mucous membranes are moist.     Pharynx: Oropharynx is clear.  Eyes:     Extraocular Movements: Extraocular movements intact.  Cardiovascular:     Rate and Rhythm: Normal rate and regular rhythm.     Pulses: Normal pulses.     Heart sounds: Normal heart sounds.  Pulmonary:     Effort: Pulmonary effort is normal.     Breath sounds: Normal breath sounds.  Abdominal:     General: Abdomen is flat.     Palpations: Abdomen is soft.  Genitourinary:    Comments: deferred Musculoskeletal:     Cervical back: Normal range of motion and neck supple.     Comments: Examination of the left knee reveals no skin wounds or lesions. She has some swelling. No erythema or effusion. Tenderness to palpation medial joint line and peripatellar retinacular tissues with a positive grind side. She has focal swelling and tenderness to palpation over the Pez anserine insertion. Range of motion is 5 to 115 degrees without any ligamentous instability. Quad strength is 4/5. Painless logrolling of the hip.   Neurovascular intact distally.  Skin:    General: Skin is warm and dry.     Capillary Refill: Capillary refill takes less than 2 seconds.  Neurological:     General: No focal deficit present.     Mental Status:  She is alert and oriented to person, place, and time.  Psychiatric:        Mood and Affect: Mood normal.        Behavior: Behavior normal.         Thought Content: Thought content normal.        Judgment: Judgment normal.     Vital signs in last 24 hours: _0 @  Labs:   Estimated body mass index is 41.54 kg/m as calculated from the following:   Height as of 01/15/22: _1  (1.626 m).   Weight as of 01/15/22: 109.8 kg.   Imaging Review Plain radiographs demonstrate severe degenerative joint disease of the left knee(s). The overall alignment ismild varus. The bone quality appears to be adequate for age and reported activity level.      Assessment/Plan:  End stage arthritis, left knee   The patient history, physical examination, clinical judgment of the provider and imaging studies are consistent with end stage degenerative joint disease of the left knee(s) and total knee arthroplasty is deemed medically necessary. The treatment options including medical management, injection therapy arthroscopy and arthroplasty were discussed at length. The risks and benefits of total knee arthroplasty were presented and reviewed. The risks due to aseptic loosening, infection, stiffness, patella tracking problems, thromboembolic complications and other imponderables were discussed. The patient acknowledged the explanation, agreed to proceed with the plan and consent was signed. Patient is being admitted for inpatient treatment for surgery, pain control, PT, OT, prophylactic antibiotics, VTE prophylaxis, progressive ambulation and ADL's and discharge planning. The patient is planning to be discharged home after an overnight stay with [facility].   Therapy Plans: outpatient therapy at [facility] Disposition: Home with sister Planned DVT Prophylaxis: aspirin 46m BID DME needed: walker. Does not want iceman today.  PCP: Cleared Cardiology: Cleared TXA: IV Allergies:  - Orange extract, strawberry - hives - Penicillin G - Hives Anesthesia Concerns: None BMI: 40.85.  Last HgbA1c: 6.8 Other: - History of CVA with left sided weakness - CHF - OSA using  CPAP - T2DM - Asthma - History of knee arthroscopy bilateral knees, meniscus.  - Cr. 1.30 12/06/21 --> 1.03 02/11/22 - On flexeril at baseline BID - Oxycodone, meloxicam, zofran.    Patient's anticipated LOS is less than 2 midnights, meeting these requirements: - Younger than 622- Lives within 1 hour of care - Has a competent adult at home to recover with post-op recover - NO history of  - Chronic pain requiring opiods  - Diabetes  - Coronary Artery Disease  - Heart failure  - Heart attack  - Stroke  - DVT/VTE  - Cardiac arrhythmia  - Respiratory Failure/COPD  - Renal failure  - Anemia  - Advanced Liver disease

## 2022-02-06 NOTE — H&P (View-Only) (Signed)
TOTAL KNEE ADMISSION H&P  Patient is being admitted for left total knee arthroplasty.  Subjective:  Chief Complaint:left knee pain.  HPI: Maureen Swanson, 56 y.o. female, has a history of pain and functional disability in the left knee due to arthritis and has failed non-surgical conservative treatments for greater than 12 weeks to includeNSAID's and/or analgesics, corticosteriod injections, flexibility and strengthening excercises, supervised PT with diminished ADL's post treatment, use of assistive devices, and activity modification.  Onset of symptoms was gradual, starting 10 years ago with rapidlly worsening course since that time. The patient noted prior procedures on the knee to include  arthroscopy on the left knee(s).  Patient currently rates pain in the left knee(s) at 10 out of 10 with activity. Patient has night pain, worsening of pain with activity and weight bearing, pain that interferes with activities of daily living, pain with passive range of motion, and joint swelling.  Patient has evidence of subchondral cysts, subchondral sclerosis, periarticular osteophytes, and joint space narrowing by imaging studies. There is no active infection.  Patient Active Problem List   Diagnosis Date Noted   OSA (obstructive sleep apnea) 12/31/2021   Acute gout of left foot 12/06/2019   CHF (congestive heart failure) ([place]) 08/03/2016   Cannabis use disorder, moderate, dependence ([place]) 11/20/2015   Homicidal ideation    Tobacco use disorder 07/20/2015   Diabetes mellitus ([place]) 07/20/2015   Cocaine use disorder, moderate, dependence ([place]) 04/03/2015   Schizoaffective disorder, bipolar type ([place]) 04/03/2015   Alcohol use disorder, moderate, dependence ([place]) 04/03/2015   Acute ischemic stroke ([place]) 12/08/2014   Gout attack 11/12/2014   Unilateral primary osteoarthritis, right knee 11/08/2014   Diabetic peripheral neuropathy ([place]) 11/04/2014   Mixed hyperlipidemia 11/04/2014   Small vessel disease,  cerebrovascular 11/04/2014   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   Cocaine abuse ([place]) 02/19/2012   Knee pain 10/17/2010   MAMMOGRAM, ABNORMAL, RIGHT 08/05/2010   AMENORRHEA, SECONDARY 10/29/2009   HERPES ZOSTER 08/20/2009   Trichomonas vaginitis 01/26/2009   GERD 10/11/2007   Essential hypertension 02/26/2007   Past Medical History:  Diagnosis Date   Anxiety    Arthritis    back and knees   Asthma    daily and prn inhalers   Atypical ductal hyperplasia of breast 03/2012   right   Bipolar 1 disorder ([place])    CHF (congestive heart failure) ([place])    Depression    Diabetes mellitus    diet-controlled   Gastric ulcer    GERD (gastroesophageal reflux disease)    Gout    Headache(784.0)    migraines   High cholesterol    Hypertension    under control, has been on med. x 12 yrs.   Substance abuse ([place])    crack cocaine:  extensive treatment with [facility] from 2014 to 2018 for this and alcohol abuse.   TMJ (temporomandibular joint disorder)     Past Surgical History:  Procedure Laterality Date   BREAST EXCISIONAL BIOPSY Right    BREAST LUMPECTOMY WITH NEEDLE LOCALIZATION  04/19/2012   Procedure: BREAST LUMPECTOMY WITH NEEDLE LOCALIZATION;  Surgeon: [name], MD;  Location: [facility];  Service: General;  Laterality: Right;   FOOT SURGERY Left 2022   [facility]   KNEE ARTHROSCOPY W/ PARTIAL MEDIAL MENISCECTOMY  05/01/2010   right    Current Outpatient Medications  Medication  Sig Dispense Refill Last Dose   ABILIFY MAINTENA 400 MG PRSY prefilled syringe Inject 400 mg into the muscle every 30 (thirty) days. Last injection given on 01/10/2021. Next injection due 02/07/2021 1 each     Accu-Chek Softclix Lancets lancets Check blood glucose twice daily before meals 100 each 12    albuterol (VENTOLIN HFA) 108 (90 Base) MCG/ACT inhaler Inhale 2  puffs into the lungs every 6 (six) hours as needed for wheezing or shortness of breath. 18 g 4    amLODipine (NORVASC) 10 MG tablet 1 tab by mouth daily      atenolol (TENORMIN) 100 MG tablet Take 1 tablet (100 mg total) by mouth daily. 30 tablet 11    atorvastatin (LIPITOR) 40 MG tablet Take 1 tablet (40 mg total) by mouth at bedtime. 30 tablet 0    benztropine (COGENTIN) 1 MG tablet Take 1 mg by mouth at bedtime.      Blood Glucose Monitoring Suppl (ACCU-CHEK AVIVA PLUS) w/Device KIT Check blood glucose twice daily before meals 1 kit 0    Blood Pressure Monitoring (BLOOD PRESSURE KIT) DEVI Check blood pressure 3 times weekly 1 each 0    celecoxib (CELEBREX) 200 MG capsule Take 200 mg by mouth 2 (two) times daily.      cetirizine (ZYRTEC) 10 MG tablet Take 1 tablet (10 mg total) by mouth daily as needed for allergies. (Patient taking differently: Take 10 mg by mouth daily.) 30 tablet 11    cyclobenzaprine (FLEXERIL) 5 MG tablet Take 5 mg by mouth 3 (three) times daily as needed for muscle spasms.      diclofenac Sodium (VOLTAREN) 1 % GEL Apply 2g to knees up to 4 times daily as needed for pain 100 g 6    fluticasone-salmeterol (ADVAIR) 100-50 MCG/ACT AEPB Inhale 1 puff into the lungs 2 (two) times daily. (Patient taking differently: Inhale 1 puff into the lungs 2 (two) times daily as needed (shortness of breath).) 1 each 11    gabapentin (NEURONTIN) 300 MG capsule Take 1 capsule (300 mg total) by mouth 3 (three) times daily. 90 capsule 0    glucose blood (ACCU-CHEK AVIVA PLUS) test strip Check blood glucose twice daily before meals 100 each 12    hydrochlorothiazide (HYDRODIURIL) 25 MG tablet 1 tab by mouth with morning meal and Losartan daily 30 tablet 11    ibuprofen (ADVIL) 800 MG tablet 1 tab by mouth twice daily with meals as needed. (Patient not taking: Reported on 02/05/2022) 60 tablet 4    lamoTRIgine (LAMICTAL) 100 MG tablet Take 100 mg by mouth daily.      losartan (COZAAR) 100 MG tablet 1  tab by mouth with HCTZ in morning daily. 30 tablet 11    melatonin 3 MG TABS tablet Take 1 tablet (3 mg total) by mouth at bedtime. 30 tablet 0    metFORMIN (GLUCOPHAGE) 1000 MG tablet 1 tab by mouth twice daily with meals 60 tablet 11    metroNIDAZOLE (FLAGYL) 500 MG tablet 1 tab by mouth twice daily with meals for 7 days. (Patient not taking: Reported on 02/05/2022) 14 tablet 0    omeprazole (PRILOSEC) 40 MG capsule 1 cap by mouth in morning 1/2 hour before breakfast and other meds 30 capsule 11    prazosin (MINIPRESS) 1 MG capsule Take 1 mg by mouth at bedtime.      traZODone (DESYREL) 100 MG tablet Take 400 mg by mouth at bedtime.      No current facility-administered  medications for this visit.   Allergies  Allergen Reactions   Chocolate Hives   Orange Hives and Other (See Comments)    "Acid foods"   Penicillins Hives and Other (See Comments)    Has patient had a PCN reaction causing immediate rash, facial/tongue/throat swelling, SOB or lightheadedness with hypotension: Yes Has patient had a PCN reaction causing severe rash involving mucus membranes or skin necrosis: Yes Has patient had a PCN reaction that required hospitalization No Has patient had a PCN reaction occurring within the last 10 years: No If all of the above answers are "NO", then may proceed with Cephalosporin use.    Strawberry Extract Swelling   Tomato Hives and Other (See Comments)    "acid foods"    Social History   Tobacco Use   Smoking status: Former    Packs/day: 1.00    Years: 37.00    Total pack years: 37.00    Types: Cigarettes    Quit date: 12/14/2020    Years since quitting: 1.1    Passive exposure: Past   Smokeless tobacco: Never  Substance Use Topics   Alcohol use: Not Currently    Comment: History of abuse.  Clean for 7 months dated 12/31/2021    Family History  Problem Relation Age of Onset   Diabetes Mother    Breast cancer Mother        She is not sure about this diagnosis   Cancer  Father        Laryngeal   Multiple sclerosis Sister        not clear if this is her diagnosis   Bipolar disorder Maternal Aunt    Alcoholism Maternal Uncle    Schizophrenia Maternal Grandfather      Review of Systems  Musculoskeletal:  Positive for arthralgias, gait problem, joint swelling and myalgias.  All other systems reviewed and are negative.   Objective:  Physical Exam Constitutional:      Appearance: Normal appearance. She is obese.  HENT:     Head: Normocephalic and atraumatic.     Nose: Nose normal.     Mouth/Throat:     Mouth: Mucous membranes are moist.     Pharynx: Oropharynx is clear.  Eyes:     Extraocular Movements: Extraocular movements intact.  Cardiovascular:     Rate and Rhythm: Normal rate and regular rhythm.     Pulses: Normal pulses.     Heart sounds: Normal heart sounds.  Pulmonary:     Effort: Pulmonary effort is normal.     Breath sounds: Normal breath sounds.  Abdominal:     General: Abdomen is flat.     Palpations: Abdomen is soft.  Genitourinary:    Comments: deferred Musculoskeletal:     Cervical back: Normal range of motion and neck supple.     Comments: Examination of the left knee reveals no skin wounds or lesions. She has some swelling. No erythema or effusion. Tenderness to palpation medial joint line and peripatellar retinacular tissues with a positive grind side. She has focal swelling and tenderness to palpation over the Pez anserine insertion. Range of motion is 5 to 115 degrees without any ligamentous instability. Quad strength is 4/5. Painless logrolling of the hip.   Neurovascular intact distally.  Skin:    General: Skin is warm and dry.     Capillary Refill: Capillary refill takes less than 2 seconds.  Neurological:     General: No focal deficit present.     Mental Status:  She is alert and oriented to person, place, and time.  Psychiatric:        Mood and Affect: Mood normal.        Behavior: Behavior normal.         Thought Content: Thought content normal.        Judgment: Judgment normal.     Vital signs in last 24 hours: _0 @  Labs:   Estimated body mass index is 41.54 kg/m as calculated from the following:   Height as of 01/15/22: _1  (1.626 m).   Weight as of 01/15/22: 109.8 kg.   Imaging Review Plain radiographs demonstrate severe degenerative joint disease of the left knee(s). The overall alignment ismild varus. The bone quality appears to be adequate for age and reported activity level.      Assessment/Plan:  End stage arthritis, left knee   The patient history, physical examination, clinical judgment of the provider and imaging studies are consistent with end stage degenerative joint disease of the left knee(s) and total knee arthroplasty is deemed medically necessary. The treatment options including medical management, injection therapy arthroscopy and arthroplasty were discussed at length. The risks and benefits of total knee arthroplasty were presented and reviewed. The risks due to aseptic loosening, infection, stiffness, patella tracking problems, thromboembolic complications and other imponderables were discussed. The patient acknowledged the explanation, agreed to proceed with the plan and consent was signed. Patient is being admitted for inpatient treatment for surgery, pain control, PT, OT, prophylactic antibiotics, VTE prophylaxis, progressive ambulation and ADL's and discharge planning. The patient is planning to be discharged home after an overnight stay with OPPT.   Therapy Plans: outpatient therapy at [facility] Disposition: Home with sister Planned DVT Prophylaxis: aspirin 46m BID DME needed: walker. Does not want iceman today.  PCP: Cleared Cardiology: Cleared TXA: IV Allergies:  - Orange extract, strawberry - hives - Penicillin G - Hives Anesthesia Concerns: None BMI: 40.85.  Last HgbA1c: 6.8 Other: - History of CVA with left sided weakness - CHF - OSA using  CPAP - T2DM - Asthma - History of knee arthroscopy bilateral knees, meniscus.  - Cr. 1.30 12/06/21 --> 1.03 02/11/22 - On flexeril at baseline BID - Oxycodone, meloxicam, zofran.    Patient's anticipated LOS is less than 2 midnights, meeting these requirements: - Younger than 622- Lives within 1 hour of care - Has a competent adult at home to recover with post-op recover - NO history of  - Chronic pain requiring opiods  - Diabetes  - Coronary Artery Disease  - Heart failure  - Heart attack  - Stroke  - DVT/VTE  - Cardiac arrhythmia  - Respiratory Failure/COPD  - Renal failure  - Anemia  - Advanced Liver disease

## 2022-02-08 NOTE — Progress Notes (Signed)
COVID Vaccine received:  '[]'$  No '[x]'$  Yes Date of any COVID positive Test in last 90 days:  PCP - Mack Hook, MD  Cardiologist - Shelva Majestic, MD   Clearance in 01-15-22 office note  Epic  Chest x-ray - CT Coronary Morph. (Score 0 ) 01-02-22 Epic EKG -  01-15-22  Epic Stress Test -  ECHO - 01-01-22  Epic Cardiac Cath -   Pacemaker/ICD device     '[]'$  N/A Spinal Cord Stimulator:'[]'$  No '[]'$  Yes      (Remind patient to bring remote DOS) Other Implants:   History of Sleep Apnea? '[]'$  No '[x]'$  Yes   Sleep Study Date:   CPAP used?- '[]'$  No '[x]'$  Yes  (Instruct to bring their mask & Tubing)  Does the patient monitor blood sugar? '[]'$  No '[]'$  Yes  '[]'$  N/A Does patient have a [employer] or Dexacom? '[]'$  No '[]'$  Yes   Fasting Blood Sugar Ranges-  Checks Blood Sugar _____ times a day  Blood Thinner Instructions: Aspirin Instructions: Last Dose:  ERAS Protocol Ordered: '[]'$  No  '[x]'$  Yes PRE-SURGERY '[]'$  ENSURE  '[x]'$  G2   Comments: Bipolar 1, Schizoeffective disorder, Hx Substance abuse  Activity level: Patient can / can not climb a flight of stairs without difficulty;  '[]'$  No CP  '[]'$  No SOB,  but would have ______   Anesthesia review: OSA (CPAP-started recently), CHF  Patient denies shortness of breath, fever, cough and chest pain at PAT appointment.  Patient verbalized understanding and agreement to the Pre-Surgical Instructions that were given to them at this PAT appointment. Patient was also educated of the need to review these PAT instructions again prior to his/her surgery.I reviewed the appropriate phone numbers to call if they have any and questions or concerns.

## 2022-02-08 NOTE — Patient Instructions (Signed)
DUE TO SPACE LIMITATIONS, ONLY TWO VISITORS  (aged 56 and older) ARE ALLOWED TO COME WITH YOU AND STAY IN THE WAITING ROOM DURING YOUR PRE OP AND PROCEDURE.   **NO VISITORS ARE ALLOWED IN THE SHORT STAY AREA OR RECOVERY ROOM!!**  IF YOU WILL BE ADMITTED INTO THE HOSPITAL YOU ARE ALLOWED ONLY FOUR SUPPORT PEOPLE DURING VISITATION HOURS (7 AM -8PM)   The support person(s) must pass our screening, and use Hand sanitizing gel. Visitors GUEST BADGE MUST BE WORN VISIBLY  One adult visitor may remain with you overnight and MUST be in the room by 8 P.M.   You are not required to quarantine at this time prior to your surgery. However, you must do this: Hand Hygiene often Do NOT share personal items Notify your provider if you are in close contact with someone who has COVID or you develop fever 100.4 or greater, new onset of sneezing, cough, sore throat, shortness of breath or body aches.       Your procedure is scheduled on:  Wednesday February 19, 2022  Report to [facility] Main Entrance.  Report to admitting at: 09:00    AM  +++++Call this number if you have any questions or problems the morning of surgery [phone]  Do not eat food :After Midnight the night prior to your surgery/procedure.  After Midnight you may have the following liquids until   08:30 AM DAY OF SURGERY  Clear Liquid Diet Water Black Coffee (sugar ok, NO MILK/CREAM OR CREAMERS)  Tea (sugar ok, NO MILK/CREAM OR CREAMERS) regular and decaf                             Plain Jell-O (NO RED)                                           Fruit ices (not with fruit pulp, NO RED)                                     Popsicles (NO RED)                                                                  Juice: apple, WHITE grape, WHITE cranberry Sports drinks like Gatorade (NO RED)                    The day of surgery:  Drink ONE (1) Pre-Surgery G2 at 08:30  AM the morning of surgery. Drink in one sitting. Do not sip.   This drink was given to you during your hospital pre-op appointment visit. Nothing else to drink after completing the Pre-Surgery G2.    FOLLOW  ANY ADDITIONAL PRE OP INSTRUCTIONS YOU RECEIVED FROM YOUR SURGEON'S OFFICE!!!   Oral Hygiene is also important to reduce your risk of infection.        Remember - BRUSH YOUR TEETH THE MORNING OF SURGERY WITH YOUR REGULAR TOOTHPASTE  Do NOT smoke after Midnight the night before surgery.  Take ONLY these medicines  the morning of surgery with A SIP OF WATER: gabapentin, lamotrigine (Lamictal), atenolol, amlodipine.  If needed you may use your albuterol and advair inhalers  METFORMIN- Day before surgery - take usual dosages, no change         Morning of surgery-  DO NOT TAKE METFORMIN  Bring CPAP mask and tubing day of surgery.                   You may not have any metal on your body including hair pins, jewelry, and body piercing  Do not wear make-up, lotions, powders, perfumes, or deodorant  Do not wear nail polish including gel and S&S, artificial / acrylic nails, or any other type of covering on natural nails including finger and toenails. If you have artificial nails, gel coating, etc., that needs to be removed by a nail salon, Please have this removed prior to surgery. Not doing so may mean that your surgery could be cancelled or delayed if the Surgeon or anesthesia staff feels like they are unable to monitor you safely.   Do not shave 48 hours prior to surgery to avoid nicks in your skin which may contribute to postoperative infections.   Contacts, Hearing Aids, dentures or bridgework may not be worn into surgery.   You may bring a small overnight bag with you on the day of surgery, only pack items that are not valuable .[facility] IS NOT RESPONSIBLE   FOR VALUABLES THAT ARE LOST OR STOLEN.   DO NOT [place]. PHARMACY WILL DISPENSE MEDICATIONS LISTED ON YOUR MEDICATION LIST TO YOU DURING YOUR ADMISSION  [place]!   Special Instructions: Bring a copy of your healthcare power of attorney and living will documents the day of surgery, if you wish to have them scanned into your [facility] Records- EPIC  Please read over the following fact sheets you were given: IF YOU HAVE QUESTIONS ABOUT YOUR PRE-OP INSTRUCTIONS, PLEASE CALL [phone]  ([place])   [facility] - Preparing for Surgery Before surgery, you can play an important role.  Because skin is not sterile, your skin needs to be as free of germs as possible.  You can reduce the number of germs on your skin by washing with CHG (chlorahexidine gluconate) soap before surgery.  CHG is an antiseptic cleaner which kills germs and bonds with the skin to continue killing germs even after washing. Please DO NOT use if you have an allergy to CHG or antibacterial soaps.  If your skin becomes reddened/irritated stop using the CHG and inform your nurse when you arrive at Short Stay. Do not shave (including legs and underarms) for at least 48 hours prior to the first CHG shower.  You may shave your face/neck.  Please follow these instructions carefully:  1.  Shower with CHG Soap the night before surgery and the  morning of surgery.  2.  If you choose to wash your hair, wash your hair first as usual with your normal  shampoo.  3.  After you shampoo, rinse your hair and body thoroughly to remove the shampoo.                             4.  Use CHG as you would any other liquid soap.  You can apply chg directly to the skin and wash.  Gently with a scrungie or clean washcloth.  5.  Apply the CHG  Soap to your body ONLY FROM THE NECK DOWN.   Do not use on face/ open                           Wound or open sores. Avoid contact with eyes, ears mouth and genitals (private parts).                       Wash face,  Genitals (private parts) with your normal soap.             6.  Wash thoroughly, paying special attention to the area where your  surgery  will  be performed.  7.  Thoroughly rinse your body with warm water from the neck down.  8.  DO NOT shower/wash with your normal soap after using and rinsing off the CHG Soap.            9.  Pat yourself dry with a clean towel.            10.  Wear clean pajamas.            11.  Place clean sheets on your bed the night of your first shower and do not  sleep with pets.  ON THE DAY OF SURGERY : Do not apply any lotions/deodorants the morning of surgery.  Please wear clean clothes to the hospital/surgery center.    FAILURE TO FOLLOW THESE INSTRUCTIONS MAY RESULT IN THE CANCELLATION OF YOUR SURGERY  PATIENT SIGNATURE_________________________________  NURSE SIGNATURE__________________________________     Incentive Spirometer    An incentive spirometer is a tool that can help keep your lungs clear and active. This tool measures how well you are filling your lungs with each breath. Taking long deep breaths may help reverse or decrease the chance of developing breathing (pulmonary) problems (especially infection) following: A long period of time when you are unable to move or be active. BEFORE THE PROCEDURE  If the spirometer includes an indicator to show your best effort, your nurse or respiratory therapist will set it to a desired goal. If possible, sit up straight or lean slightly forward. Try not to slouch. Hold the incentive spirometer in an upright position. INSTRUCTIONS FOR USE  Sit on the edge of your bed if possible, or sit up as far as you can in bed or on a chair. Hold the incentive spirometer in an upright position. Breathe out normally. Place the mouthpiece in your mouth and seal your lips tightly around it. Breathe in slowly and as deeply as possible, raising the piston or the ball toward the top of the column. Hold your breath for 3-5 seconds or for as long as possible. Allow the piston or ball to fall to the bottom of the column. Remove the mouthpiece from your mouth and  breathe out normally. Rest for a few seconds and repeat Steps 1 through 7 at least 10 times every 1-2 hours when you are awake. Take your time and take a few normal breaths between deep breaths. The spirometer may include an indicator to show your best effort. Use the indicator as a goal to work toward during each repetition. After each set of 10 deep breaths, practice coughing to be sure your lungs are clear. If you have an incision (the cut made at the time of surgery), support your incision when coughing by placing a pillow or rolled up towels firmly against it. Once you are able to get  out of bed, walk around indoors and cough well. You may stop using the incentive spirometer when instructed by your caregiver.  RISKS AND COMPLICATIONS Take your time so you do not get dizzy or light-headed. If you are in pain, you may need to take or ask for pain medication before doing incentive spirometry. It is harder to take a deep breath if you are having pain. AFTER USE Rest and breathe slowly and easily. It can be helpful to keep track of a log of your progress. Your caregiver can provide you with a simple table to help with this. If you are using the spirometer at home, follow these instructions: [place] IF:  You are having difficultly using the spirometer. You have trouble using the spirometer as often as instructed. Your pain medication is not giving enough relief while using the spirometer. You develop fever of 100.5 F (38.1 C) or higher.                                                                                                    SEEK IMMEDIATE MEDICAL CARE IF:  You cough up bloody sputum that had not been present before. You develop fever of 102 F (38.9 C) or greater. You develop worsening pain at or near the incision site. MAKE SURE YOU:  Understand these instructions. Will watch your condition. Will get help right away if you are not doing well or get worse. Document  Released: 10/13/2006 Document Revised: 08/25/2011 Document Reviewed: 12/14/2006 [facility] Patient Information 2014 [place], [place].   ________________________________________________________________________

## 2022-02-11 ENCOUNTER — Encounter (HOSPITAL_COMMUNITY): Payer: Self-pay

## 2022-02-11 ENCOUNTER — Encounter (HOSPITAL_COMMUNITY)
Admission: RE | Admit: 2022-02-11 | Discharge: 2022-02-11 | Disposition: A | Discharge status: Home / Self Care | Payer: Medicaid Other | Source: Ambulatory Visit | Attending: Orthopedic Surgery | Admitting: Orthopedic Surgery

## 2022-02-11 ENCOUNTER — Other Ambulatory Visit: Payer: Self-pay

## 2022-02-11 VITALS — BP 136/98 | HR 70 | Temp 98.5°F | Resp 18 | Ht 64.0 in | Wt 238.0 lb

## 2022-02-11 DIAGNOSIS — Z01818 Encounter for other preprocedural examination: Secondary | ICD-10-CM

## 2022-02-11 DIAGNOSIS — E119 Type 2 diabetes mellitus without complications: Secondary | ICD-10-CM | POA: Diagnosis not present

## 2022-02-11 DIAGNOSIS — Z6841 Body Mass Index (BMI) 40.0 and over, adult: Secondary | ICD-10-CM | POA: Insufficient documentation

## 2022-02-11 DIAGNOSIS — I509 Heart failure, unspecified: Secondary | ICD-10-CM | POA: Insufficient documentation

## 2022-02-11 DIAGNOSIS — I10 Essential (primary) hypertension: Secondary | ICD-10-CM

## 2022-02-11 DIAGNOSIS — F259 Schizoaffective disorder, unspecified: Secondary | ICD-10-CM | POA: Diagnosis not present

## 2022-02-11 DIAGNOSIS — I110 Hypertensive heart disease with heart failure: Secondary | ICD-10-CM | POA: Diagnosis not present

## 2022-02-11 DIAGNOSIS — F319 Bipolar disorder, unspecified: Secondary | ICD-10-CM | POA: Diagnosis not present

## 2022-02-11 DIAGNOSIS — G4730 Sleep apnea, unspecified: Secondary | ICD-10-CM | POA: Diagnosis not present

## 2022-02-11 DIAGNOSIS — M1712 Unilateral primary osteoarthritis, left knee: Secondary | ICD-10-CM | POA: Diagnosis not present

## 2022-02-11 DIAGNOSIS — F1910 Other psychoactive substance abuse, uncomplicated: Secondary | ICD-10-CM

## 2022-02-11 HISTORY — DX: Sleep apnea, unspecified: G47.30

## 2022-02-11 HISTORY — DX: Cerebral infarction, unspecified: I63.9

## 2022-02-11 LAB — CBC
HCT: 37.9 % (ref 36.0–46.0)
Hemoglobin: 11.8 g/dL — ABNORMAL LOW (ref 12.0–15.0)
MCH: 29.1 pg (ref 26.0–34.0)
MCHC: 31.1 g/dL (ref 30.0–36.0)
MCV: 93.6 fL (ref 80.0–100.0)
Platelets: 236 10*3/uL (ref 150–400)
RBC: 4.05 MIL/uL (ref 3.87–5.11)
RDW: 12.1 % (ref 11.5–15.5)
WBC: 5 10*3/uL (ref 4.0–10.5)
nRBC: 0 % (ref 0.0–0.2)

## 2022-02-11 LAB — SURGICAL PCR SCREEN
MRSA, PCR: NEGATIVE
Staphylococcus aureus: NEGATIVE

## 2022-02-11 LAB — HEMOGLOBIN A1C
Hgb A1c MFr Bld: 6.3 % — ABNORMAL HIGH (ref 4.8–5.6)
Mean Plasma Glucose: 134.11 mg/dL

## 2022-02-11 LAB — COMPREHENSIVE METABOLIC PANEL
ALT: 22 U/L (ref 0–44)
AST: 25 U/L (ref 15–41)
Albumin: 4 g/dL (ref 3.5–5.0)
Alkaline Phosphatase: 46 U/L (ref 38–126)
Anion gap: 7 (ref 5–15)
BUN: 15 mg/dL (ref 6–20)
CO2: 24 mmol/L (ref 22–32)
Calcium: 9.4 mg/dL (ref 8.9–10.3)
Chloride: 107 mmol/L (ref 98–111)
Creatinine, Ser: 1.03 mg/dL — ABNORMAL HIGH (ref 0.44–1.00)
GFR, Estimated: >60 mL/min (ref >60)
Glucose, Bld: 114 mg/dL — ABNORMAL HIGH (ref 70–99)
Potassium: 3.8 mmol/L (ref 3.5–5.1)
Sodium: 138 mmol/L (ref 135–145)
Total Bilirubin: 0.6 mg/dL (ref 0.3–1.2)
Total Protein: 7.3 g/dL (ref 6.5–8.1)

## 2022-02-11 LAB — GLUCOSE, CAPILLARY: Glucose-Capillary: 132 mg/dL — ABNORMAL HIGH (ref 70–99)

## 2022-02-12 NOTE — Progress Notes (Signed)
Anesthesia Chart Review   Case: [id number] Date/Time: 02/19/22 1115   Procedure: COMPUTER ASSISTED TOTAL KNEE ARTHROPLASTY (Left: Knee)   Anesthesia type: Spinal   Pre-op diagnosis: Left knee osteoarthritis   Location: [facility] ROOM 08 / [facility]   Surgeons: Rod Can, MD       DISCUSSION:56 y.o. former smoker with h/o bipolar 1, schizoaffective disorder, HTN, sleep apnea on CPAP, DM (A1C 6.3), CHF, left knee OA scheduled for above procedure 02/19/2022 with Dr. Rod Can.   Pt seen by cardiology 01/15/2022 for preoperative evaluation.  Per Dr. Claiborne Billings, "I reviewed her 2D echo Doppler study which shows normal LV systolic function.  She had moderate left ventricular hypertrophy with grade 1 diastolic dysfunction.  There was no significant valvular abnormalities although her tricuspid valve was poorly visualized.  With her chest pain symptomatology and cardiovascular risk factors she underwent coronary CTA which was excellent.  Calcium score is 0.  There was no evidence for CAD.  Her blood pressure today is stable on current therapy.  She has lost 6 pounds since her initial evaluation.  BMI is consistent with morbid obesity at 41.5.  She will be given clearance to undergo her elective knee surgery to be done by Dr. Lyla Glassing. "  Anticipate pt can proceed with planned procedure barring acute status change.   VS: BP (!) 136/98 Comment: right arm sitting,  no medications yet this morning  Pulse 70   Temp 36.9 C (Oral)   Resp 18   Ht '5\' 4"'$  (1.626 m)   Wt 108 kg   SpO2 95%   BMI 40.85 kg/m   PROVIDERS: Mack Hook, MD is PCP   Cardiologist - Shelva Majestic, MD LABS: Labs reviewed: Acceptable for surgery. (all labs ordered are listed, but only abnormal results are displayed)  Labs Reviewed  HEMOGLOBIN A1C - Abnormal; Notable for the following components:      Result Value   Hgb A1c MFr Bld 6.3 (*)    All other components within normal limits  COMPREHENSIVE METABOLIC PANEL - Abnormal;  Notable for the following components:   Glucose, Bld 114 (*)    Creatinine, Ser 1.03 (*)    All other components within normal limits  CBC - Abnormal; Notable for the following components:   Hemoglobin 11.8 (*)    All other components within normal limits  GLUCOSE, CAPILLARY - Abnormal; Notable for the following components:   Glucose-Capillary 132 (*)    All other components within normal limits  SURGICAL PCR SCREEN     IMAGES:   EKG:   CV: Echo 01/01/2022 1. Left ventricular ejection fraction, by estimation, is 60 to 65%. The  left ventricle has normal function. The left ventricle has no regional  wall motion abnormalities. There is moderate left ventricular hypertrophy.  Left ventricular diastolic  parameters are consistent with Grade I diastolic dysfunction (impaired  relaxation).   2. Right ventricular systolic function is normal. The right ventricular  size is normal. Tricuspid regurgitation signal is inadequate for assessing  PA pressure.   3. The mitral valve is normal in structure. No evidence of mitral valve  regurgitation.   4. The aortic valve is tricuspid. Aortic valve regurgitation is not  visualized.   5. The inferior vena cava is normal in size with greater than 50%  respiratory variability, suggesting right atrial pressure of 3 mmHg.  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 x 12 yrs.   Sleep apnea    Stroke ([place])    1995   Mild. no residual problems   Substance abuse ([place])    crack cocaine:  extensive treatment with [facility] from 2014 to 2018 for this and alcohol abuse.   TMJ (temporomandibular joint disorder)     Past Surgical History:  Procedure Laterality Date   BREAST EXCISIONAL BIOPSY Right    BREAST LUMPECTOMY WITH NEEDLE LOCALIZATION  04/19/2012   Procedure: BREAST LUMPECTOMY WITH NEEDLE LOCALIZATION;  Surgeon: [name], MD;  Location: [facility];  Service: General;  Laterality: Right;   FOOT SURGERY Left 2022   [facility]   KNEE ARTHROSCOPY W/ PARTIAL MEDIAL MENISCECTOMY  05/01/2010   right    MEDICATIONS:  ABILIFY MAINTENA 400 MG PRSY prefilled syringe   Accu-Chek Softclix Lancets lancets   albuterol (VENTOLIN HFA) 108 (90 Base) MCG/ACT inhaler   amLODipine (NORVASC) 10 MG tablet   atenolol (TENORMIN) 100 MG tablet   atorvastatin (LIPITOR) 40 MG tablet   benztropine (COGENTIN) 1 MG tablet   Blood Glucose Monitoring Suppl (ACCU-CHEK AVIVA PLUS) w/Device KIT   Blood Pressure Monitoring (BLOOD PRESSURE KIT) DEVI   celecoxib (CELEBREX) 200 MG capsule   cetirizine (ZYRTEC) 10 MG tablet   cyclobenzaprine (FLEXERIL) 5 MG tablet   diclofenac Sodium (VOLTAREN) 1 % GEL   fluticasone-salmeterol (ADVAIR) 100-50 MCG/ACT AEPB   gabapentin (NEURONTIN) 300 MG capsule   glucose blood (ACCU-CHEK AVIVA PLUS) test strip   hydrochlorothiazide (HYDRODIURIL) 25 MG tablet   ibuprofen (ADVIL) 800 MG tablet   lamoTRIgine (LAMICTAL) 100 MG tablet   losartan (COZAAR) 100 MG tablet   melatonin 3 MG TABS tablet   metFORMIN (GLUCOPHAGE) 1000 MG tablet   metroNIDAZOLE (FLAGYL) 500 MG tablet   omeprazole (PRILOSEC) 40 MG capsule   prazosin (MINIPRESS) 1 MG capsule   traZODone (DESYREL) 100 MG tablet   No current facility-administered medications for this encounter.   Konrad Felix Ward, PA-C [facility] [phone]

## 2022-02-12 NOTE — Anesthesia Preprocedure Evaluation (Addendum)
Anesthesia Evaluation  Patient identified by MRN, date of birth, ID band Patient awake    Reviewed: Allergy & Precautions, NPO status , Patient's Chart, lab work & pertinent test results, reviewed documented beta blocker date and time   Airway Mallampati: III       Dental no notable dental hx.    Pulmonary former smoker,    Pulmonary exam normal        Cardiovascular hypertension, Pt. on medications and Pt. on home beta blockers Normal cardiovascular exam     Neuro/Psych  Headaches, PSYCHIATRIC DISORDERS Anxiety Depression Bipolar Disorder CVA, No Residual Symptoms    GI/Hepatic GERD  Medicated and Controlled,(+)     substance abuse  cocaine use,   Endo/Other  diabetes, Type 2, Oral Hypoglycemic AgentsMorbid obesity  Renal/GU      Musculoskeletal  (+) Arthritis , Osteoarthritis,    Abdominal (+) + obese,   Peds  Hematology   Anesthesia Other Findings  01/06/2022 8:21 AM EDT   Normal LV function with EF 60 to 65%. Moderate LVH. Grade 1 diastolic dysfunction. Valves essentially normal       Reproductive/Obstetrics                            Anesthesia Physical Anesthesia Plan  ASA: 3  Anesthesia Plan: Spinal   Post-op Pain Management: Regional block*   Induction:   PONV Risk Score and Plan: 2 and Ondansetron and Dexamethasone  Airway Management Planned: Natural Airway and Simple Face Mask  Additional Equipment: None  Intra-op Plan:   Post-operative Plan:   Informed Consent: I have reviewed the patients History and Physical, chart, labs and discussed the procedure including the risks, benefits and alternatives for the proposed anesthesia with the patient or authorized representative who has indicated his/her understanding and acceptance.       Plan Discussed with: CRNA  Anesthesia Plan Comments: (See PAT note 02/11/2022)       Anesthesia Quick Evaluation

## 2022-02-19 ENCOUNTER — Other Ambulatory Visit: Payer: Self-pay

## 2022-02-19 ENCOUNTER — Ambulatory Visit (HOSPITAL_BASED_OUTPATIENT_CLINIC_OR_DEPARTMENT_OTHER): Payer: Medicaid Other | Admitting: Certified Registered"

## 2022-02-19 ENCOUNTER — Ambulatory Visit: Payer: Self-pay | Admitting: Student

## 2022-02-19 ENCOUNTER — Encounter (HOSPITAL_COMMUNITY): Payer: Self-pay | Admitting: Orthopedic Surgery

## 2022-02-19 ENCOUNTER — Observation Stay (HOSPITAL_COMMUNITY)
Admission: RE | Admit: 2022-02-19 | Discharge: 2022-02-22 | Disposition: A | Discharge status: Home / Self Care | Payer: Medicaid Other | Attending: Orthopedic Surgery | Admitting: Orthopedic Surgery

## 2022-02-19 ENCOUNTER — Observation Stay (HOSPITAL_COMMUNITY): Payer: Medicaid Other

## 2022-02-19 ENCOUNTER — Encounter (HOSPITAL_COMMUNITY)
Admission: RE | Disposition: A | Discharge status: Home / Self Care | Payer: Self-pay | Source: Home / Self Care | Attending: Orthopedic Surgery

## 2022-02-19 ENCOUNTER — Ambulatory Visit (HOSPITAL_COMMUNITY): Payer: Medicaid Other | Admitting: Physician Assistant

## 2022-02-19 DIAGNOSIS — Z87891 Personal history of nicotine dependence: Secondary | ICD-10-CM | POA: Diagnosis not present

## 2022-02-19 DIAGNOSIS — Z7984 Long term (current) use of oral hypoglycemic drugs: Secondary | ICD-10-CM | POA: Insufficient documentation

## 2022-02-19 DIAGNOSIS — Z8673 Personal history of transient ischemic attack (TIA), and cerebral infarction without residual deficits: Secondary | ICD-10-CM | POA: Diagnosis not present

## 2022-02-19 DIAGNOSIS — M1712 Unilateral primary osteoarthritis, left knee: Principal | ICD-10-CM | POA: Diagnosis present

## 2022-02-19 DIAGNOSIS — Z79899 Other long term (current) drug therapy: Secondary | ICD-10-CM | POA: Diagnosis not present

## 2022-02-19 DIAGNOSIS — Z96652 Presence of left artificial knee joint: Secondary | ICD-10-CM

## 2022-02-19 DIAGNOSIS — Z23 Encounter for immunization: Secondary | ICD-10-CM | POA: Diagnosis not present

## 2022-02-19 DIAGNOSIS — E1142 Type 2 diabetes mellitus with diabetic polyneuropathy: Secondary | ICD-10-CM | POA: Insufficient documentation

## 2022-02-19 DIAGNOSIS — E119 Type 2 diabetes mellitus without complications: Secondary | ICD-10-CM

## 2022-02-19 DIAGNOSIS — J45909 Unspecified asthma, uncomplicated: Secondary | ICD-10-CM | POA: Insufficient documentation

## 2022-02-19 DIAGNOSIS — I509 Heart failure, unspecified: Secondary | ICD-10-CM | POA: Insufficient documentation

## 2022-02-19 DIAGNOSIS — I110 Hypertensive heart disease with heart failure: Secondary | ICD-10-CM | POA: Insufficient documentation

## 2022-02-19 DIAGNOSIS — I10 Essential (primary) hypertension: Secondary | ICD-10-CM

## 2022-02-19 HISTORY — PX: KNEE ARTHROPLASTY: SHX992

## 2022-02-19 LAB — GLUCOSE, CAPILLARY
Glucose-Capillary: 171 mg/dL — ABNORMAL HIGH (ref 70–99)
Glucose-Capillary: 165 mg/dL — ABNORMAL HIGH (ref 70–99)
Glucose-Capillary: 241 mg/dL — ABNORMAL HIGH (ref 70–99)
Glucose-Capillary: 217 mg/dL — ABNORMAL HIGH (ref 70–99)

## 2022-02-19 SURGERY — ARTHROPLASTY, KNEE, TOTAL, USING IMAGELESS COMPUTER-ASSISTED NAVIGATION
Anesthesia: Spinal | Site: Knee | Laterality: Left

## 2022-02-19 MED ORDER — SODIUM CHLORIDE (PF) 0.9 % IJ SOLN
INTRAMUSCULAR | Status: DC | PRN
  Administered 2022-02-19: 30 mL

## 2022-02-19 MED ORDER — TRAZODONE HCL 100 MG PO TABS
400.0000 mg | ORAL_TABLET | Freq: Every day | ORAL | Status: DC
  Administered 2022-02-19 – 2022-02-21 (×3): 400 mg via ORAL
  Filled 2022-02-19 (×3): qty 4

## 2022-02-19 MED ORDER — ATORVASTATIN CALCIUM 40 MG PO TABS
40.0000 mg | ORAL_TABLET | Freq: Every day | ORAL | Status: DC
  Administered 2022-02-19 – 2022-02-21 (×3): 40 mg via ORAL
  Filled 2022-02-19 (×3): qty 1

## 2022-02-19 MED ORDER — DOCUSATE SODIUM 100 MG PO CAPS
100.0000 mg | ORAL_CAPSULE | Freq: Two times a day (BID) | ORAL | Status: DC
  Administered 2022-02-19 – 2022-02-22 (×6): 100 mg via ORAL
  Filled 2022-02-19 (×6): qty 1

## 2022-02-19 MED ORDER — HYDROMORPHONE HCL 1 MG/ML IJ SOLN
0.5000 mg | INTRAMUSCULAR | Status: DC | PRN
  Administered 2022-02-19 – 2022-02-21 (×4): 1 mg via INTRAVENOUS
  Filled 2022-02-19 (×4): qty 1

## 2022-02-19 MED ORDER — POVIDONE-IODINE 10 % EX SWAB: 2.0000 | Freq: Once | CUTANEOUS | Status: DC

## 2022-02-19 MED ORDER — PANTOPRAZOLE SODIUM 40 MG PO TBEC
40.0000 mg | DELAYED_RELEASE_TABLET | Freq: Every day | ORAL | Status: DC
  Administered 2022-02-19 – 2022-02-22 (×4): 40 mg via ORAL
  Filled 2022-02-19 (×4): qty 1

## 2022-02-19 MED ORDER — FENTANYL CITRATE PF 50 MCG/ML IJ SOSY
PREFILLED_SYRINGE | INTRAMUSCULAR | Status: AC
  Administered 2022-02-19: 50 ug via INTRAVENOUS
  Filled 2022-02-19: qty 2

## 2022-02-19 MED ORDER — EPHEDRINE SULFATE-NACL 50-0.9 MG/10ML-% IV SOSY
PREFILLED_SYRINGE | INTRAVENOUS | Status: DC | PRN
  Administered 2022-02-19: 10 mg via INTRAVENOUS

## 2022-02-19 MED ORDER — ATENOLOL 100 MG PO TABS
100.0000 mg | ORAL_TABLET | ORAL | Status: AC
  Administered 2022-02-19: 100 mg via ORAL
  Filled 2022-02-19: qty 1

## 2022-02-19 MED ORDER — EPHEDRINE 5 MG/ML INJ
INTRAVENOUS | Status: AC
  Filled 2022-02-19: qty 5

## 2022-02-19 MED ORDER — OXYCODONE HCL 5 MG PO TABS
5.0000 mg | ORAL_TABLET | ORAL | Status: DC | PRN
  Administered 2022-02-19 – 2022-02-21 (×7): 10 mg via ORAL
  Filled 2022-02-19 (×7): qty 2

## 2022-02-19 MED ORDER — OXYCODONE HCL 5 MG PO TABS
10.0000 mg | ORAL_TABLET | ORAL | Status: DC | PRN
  Administered 2022-02-21 (×3): 15 mg via ORAL
  Administered 2022-02-21: 10 mg via ORAL
  Administered 2022-02-22 (×2): 15 mg via ORAL
  Filled 2022-02-19: qty 2
  Filled 2022-02-19 (×5): qty 3

## 2022-02-19 MED ORDER — BUPIVACAINE-EPINEPHRINE 0.25% -1:200000 IJ SOLN
INTRAMUSCULAR | Status: DC | PRN
  Administered 2022-02-19: 30 mL

## 2022-02-19 MED ORDER — ONDANSETRON HCL 4 MG/2ML IJ SOLN
INTRAMUSCULAR | Status: DC | PRN
  Administered 2022-02-19: 4 mg via INTRAVENOUS

## 2022-02-19 MED ORDER — METHOCARBAMOL 500 MG PO TABS
500.0000 mg | ORAL_TABLET | Freq: Four times a day (QID) | ORAL | Status: DC | PRN
  Administered 2022-02-19 – 2022-02-22 (×7): 500 mg via ORAL
  Filled 2022-02-19 (×7): qty 1

## 2022-02-19 MED ORDER — PHENOL 1.4 % MT LIQD: 1.0000 | OROMUCOSAL | Status: DC | PRN

## 2022-02-19 MED ORDER — FENTANYL CITRATE PF 50 MCG/ML IJ SOSY: 50.0000 ug | PREFILLED_SYRINGE | Freq: Once | INTRAMUSCULAR | Status: AC

## 2022-02-19 MED ORDER — LAMOTRIGINE 100 MG PO TABS
100.0000 mg | ORAL_TABLET | Freq: Every day | ORAL | Status: DC
  Administered 2022-02-19 – 2022-02-22 (×4): 100 mg via ORAL
  Filled 2022-02-19 (×4): qty 1

## 2022-02-19 MED ORDER — FLUTICASONE FUROATE-VILANTEROL 100-25 MCG/ACT IN AEPB
1.0000 | INHALATION_SPRAY | Freq: Every day | RESPIRATORY_TRACT | Status: DC
  Administered 2022-02-20 – 2022-02-22 (×3): 1 via RESPIRATORY_TRACT
  Filled 2022-02-19: qty 28

## 2022-02-19 MED ORDER — SODIUM CHLORIDE 0.9 % IV SOLN: INTRAVENOUS | Status: DC

## 2022-02-19 MED ORDER — ASPIRIN 81 MG PO CHEW
81.0000 mg | CHEWABLE_TABLET | Freq: Two times a day (BID) | ORAL | Status: DC
  Administered 2022-02-19 – 2022-02-22 (×6): 81 mg via ORAL
  Filled 2022-02-19 (×6): qty 1

## 2022-02-19 MED ORDER — LORATADINE 10 MG PO TABS
10.0000 mg | ORAL_TABLET | Freq: Every day | ORAL | Status: DC
  Administered 2022-02-19 – 2022-02-22 (×4): 10 mg via ORAL
  Filled 2022-02-19 (×4): qty 1

## 2022-02-19 MED ORDER — METOCLOPRAMIDE HCL 5 MG/ML IJ SOLN: 5.0000 mg | Freq: Three times a day (TID) | INTRAMUSCULAR | Status: DC | PRN

## 2022-02-19 MED ORDER — POVIDONE-IODINE 10 % EX SWAB
2.0000 | Freq: Once | CUTANEOUS | Status: AC
  Administered 2022-02-19: 2 via TOPICAL

## 2022-02-19 MED ORDER — GABAPENTIN 300 MG PO CAPS
300.0000 mg | ORAL_CAPSULE | Freq: Three times a day (TID) | ORAL | Status: DC
  Administered 2022-02-19 – 2022-02-22 (×8): 300 mg via ORAL
  Filled 2022-02-19 (×8): qty 1

## 2022-02-19 MED ORDER — ONDANSETRON HCL 4 MG/2ML IJ SOLN
INTRAMUSCULAR | Status: AC
Start: 2022-02-19 — End: ?
  Filled 2022-02-19: qty 2

## 2022-02-19 MED ORDER — INSULIN ASPART 100 UNIT/ML IJ SOLN
0.0000 [IU] | Freq: Every day | INTRAMUSCULAR | Status: DC
  Administered 2022-02-19 – 2022-02-20 (×2): 2 [IU] via SUBCUTANEOUS

## 2022-02-19 MED ORDER — INSULIN ASPART 100 UNIT/ML IJ SOLN
0.0000 [IU] | Freq: Three times a day (TID) | INTRAMUSCULAR | Status: DC
  Administered 2022-02-19: 5 [IU] via SUBCUTANEOUS
  Administered 2022-02-20: 2 [IU] via SUBCUTANEOUS
  Administered 2022-02-20: 3 [IU] via SUBCUTANEOUS
  Administered 2022-02-20: 1 [IU] via SUBCUTANEOUS
  Administered 2022-02-21 (×2): 2 [IU] via SUBCUTANEOUS
  Administered 2022-02-21: 3 [IU] via SUBCUTANEOUS

## 2022-02-19 MED ORDER — ACETAMINOPHEN 500 MG PO TABS
1000.0000 mg | ORAL_TABLET | Freq: Once | ORAL | Status: AC
  Administered 2022-02-19: 1000 mg via ORAL
  Filled 2022-02-19: qty 2

## 2022-02-19 MED ORDER — PROPOFOL 500 MG/50ML IV EMUL
INTRAVENOUS | Status: DC | PRN
  Administered 2022-02-19: 100 ug/kg/min via INTRAVENOUS

## 2022-02-19 MED ORDER — SENNA 8.6 MG PO TABS
1.0000 | ORAL_TABLET | Freq: Two times a day (BID) | ORAL | Status: DC
  Administered 2022-02-19 – 2022-02-22 (×6): 8.6 mg via ORAL
  Filled 2022-02-19 (×6): qty 1

## 2022-02-19 MED ORDER — MIDAZOLAM HCL 2 MG/2ML IJ SOLN
INTRAMUSCULAR | Status: AC
  Administered 2022-02-19: 1 mg via INTRAVENOUS
  Filled 2022-02-19: qty 2

## 2022-02-19 MED ORDER — PROPOFOL 10 MG/ML IV BOLUS
INTRAVENOUS | Status: AC
  Filled 2022-02-19: qty 20

## 2022-02-19 MED ORDER — LACTATED RINGERS IV SOLN: INTRAVENOUS | Status: DC

## 2022-02-19 MED ORDER — DEXAMETHASONE SODIUM PHOSPHATE 10 MG/ML IJ SOLN
INTRAMUSCULAR | Status: DC | PRN
  Administered 2022-02-19: 8 mg via INTRAVENOUS

## 2022-02-19 MED ORDER — INFLUENZA VAC SPLIT QUAD 0.5 ML IM SUSY
0.5000 mL | PREFILLED_SYRINGE | INTRAMUSCULAR | Status: AC
Start: 2022-02-20 — End: 2022-02-20
  Administered 2022-02-20: 0.5 mL via INTRAMUSCULAR
  Filled 2022-02-19: qty 0.5

## 2022-02-19 MED ORDER — TRANEXAMIC ACID-NACL 1000-0.7 MG/100ML-% IV SOLN
1000.0000 mg | INTRAVENOUS | Status: AC
  Administered 2022-02-19: 1000 mg via INTRAVENOUS
  Filled 2022-02-19: qty 100

## 2022-02-19 MED ORDER — SODIUM CHLORIDE (PF) 0.9 % IJ SOLN
INTRAMUSCULAR | Status: AC
  Filled 2022-02-19: qty 50

## 2022-02-19 MED ORDER — KETOROLAC TROMETHAMINE 30 MG/ML IJ SOLN
INTRAMUSCULAR | Status: DC | PRN
  Administered 2022-02-19: 30 mg

## 2022-02-19 MED ORDER — DEXAMETHASONE SODIUM PHOSPHATE 10 MG/ML IJ SOLN
INTRAMUSCULAR | Status: AC
  Filled 2022-02-19: qty 1

## 2022-02-19 MED ORDER — KETOROLAC TROMETHAMINE 30 MG/ML IJ SOLN
INTRAMUSCULAR | Status: AC
Start: 2022-02-19 — End: ?
  Filled 2022-02-19: qty 1

## 2022-02-19 MED ORDER — MIDAZOLAM HCL 2 MG/2ML IJ SOLN: 1.0000 mg | Freq: Once | INTRAMUSCULAR | Status: AC

## 2022-02-19 MED ORDER — MEPERIDINE HCL 50 MG/ML IJ SOLN: 6.2500 mg | INTRAMUSCULAR | Status: DC | PRN

## 2022-02-19 MED ORDER — ALBUTEROL SULFATE (2.5 MG/3ML) 0.083% IN NEBU: 2.5000 mg | INHALATION_SOLUTION | Freq: Four times a day (QID) | RESPIRATORY_TRACT | Status: DC | PRN

## 2022-02-19 MED ORDER — CLONIDINE HCL (ANALGESIA) 100 MCG/ML EP SOLN
EPIDURAL | Status: DC | PRN
  Administered 2022-02-19: 100 ug

## 2022-02-19 MED ORDER — DIPHENHYDRAMINE HCL 12.5 MG/5ML PO ELIX: 12.5000 mg | ORAL_SOLUTION | ORAL | Status: DC | PRN

## 2022-02-19 MED ORDER — ACETAMINOPHEN 325 MG PO TABS
325.0000 mg | ORAL_TABLET | Freq: Four times a day (QID) | ORAL | Status: DC | PRN
  Administered 2022-02-21 (×2): 650 mg via ORAL
  Filled 2022-02-19 (×2): qty 2

## 2022-02-19 MED ORDER — SODIUM CHLORIDE 0.9 % IR SOLN
Status: DC | PRN
  Administered 2022-02-19 (×2): 1000 mL

## 2022-02-19 MED ORDER — BUPIVACAINE IN DEXTROSE 0.75-8.25 % IT SOLN
INTRATHECAL | Status: DC | PRN
  Administered 2022-02-19: 2 mL via INTRATHECAL

## 2022-02-19 MED ORDER — BISACODYL 10 MG RE SUPP: 10.0000 mg | Freq: Every day | RECTAL | Status: DC | PRN

## 2022-02-19 MED ORDER — KETOROLAC TROMETHAMINE 30 MG/ML IJ SOLN: 30.0000 mg | Freq: Once | INTRAMUSCULAR | Status: DC | PRN

## 2022-02-19 MED ORDER — ACETAMINOPHEN 500 MG PO TABS
1000.0000 mg | ORAL_TABLET | Freq: Four times a day (QID) | ORAL | Status: AC
  Administered 2022-02-19 – 2022-02-20 (×4): 1000 mg via ORAL
  Filled 2022-02-19 (×4): qty 2

## 2022-02-19 MED ORDER — PROMETHAZINE HCL 25 MG/ML IJ SOLN: 6.2500 mg | INTRAMUSCULAR | Status: DC | PRN

## 2022-02-19 MED ORDER — BUPIVACAINE-EPINEPHRINE (PF) 0.25% -1:200000 IJ SOLN
INTRAMUSCULAR | Status: AC
  Filled 2022-02-19: qty 30

## 2022-02-19 MED ORDER — ALBUTEROL SULFATE HFA 108 (90 BASE) MCG/ACT IN AERS: 2.0000 | INHALATION_SPRAY | Freq: Four times a day (QID) | RESPIRATORY_TRACT | Status: DC | PRN

## 2022-02-19 MED ORDER — ISOPROPYL ALCOHOL 70 % SOLN
Status: DC | PRN
  Administered 2022-02-19: 1 via TOPICAL

## 2022-02-19 MED ORDER — METHOCARBAMOL 500 MG IVPB - SIMPLE MED: 500.0000 mg | Freq: Four times a day (QID) | INTRAVENOUS | Status: DC | PRN

## 2022-02-19 MED ORDER — AMLODIPINE BESYLATE 10 MG PO TABS
10.0000 mg | ORAL_TABLET | ORAL | Status: AC
Start: 2022-02-19 — End: 2022-02-19
  Administered 2022-02-19: 10 mg via ORAL
  Filled 2022-02-19: qty 1

## 2022-02-19 MED ORDER — ONDANSETRON HCL 4 MG/2ML IJ SOLN: 4.0000 mg | Freq: Four times a day (QID) | INTRAMUSCULAR | Status: DC | PRN

## 2022-02-19 MED ORDER — ROPIVACAINE HCL 5 MG/ML IJ SOLN
INTRAMUSCULAR | Status: DC | PRN
  Administered 2022-02-19 (×6): 5 mL via PERINEURAL

## 2022-02-19 MED ORDER — ATENOLOL 50 MG PO TABS
100.0000 mg | ORAL_TABLET | Freq: Every day | ORAL | Status: DC
  Administered 2022-02-19 – 2022-02-22 (×4): 100 mg via ORAL
  Filled 2022-02-19 (×4): qty 2

## 2022-02-19 MED ORDER — BENZTROPINE MESYLATE 0.5 MG PO TABS
1.0000 mg | ORAL_TABLET | Freq: Every day | ORAL | Status: DC
  Administered 2022-02-19 – 2022-02-21 (×3): 1 mg via ORAL
  Filled 2022-02-19 (×3): qty 2

## 2022-02-19 MED ORDER — ALUM & MAG HYDROXIDE-SIMETH 200-200-20 MG/5ML PO SUSP
30.0000 mL | ORAL | Status: DC | PRN
Start: 2022-02-19 — End: 2022-02-22

## 2022-02-19 MED ORDER — CHLORHEXIDINE GLUCONATE 0.12 % MT SOLN
15.0000 mL | Freq: Once | OROMUCOSAL | Status: AC
Start: 2022-02-19 — End: 2022-02-19
  Administered 2022-02-19: 15 mL via OROMUCOSAL

## 2022-02-19 MED ORDER — HYDROMORPHONE HCL 1 MG/ML IJ SOLN: 0.2500 mg | INTRAMUSCULAR | Status: DC | PRN

## 2022-02-19 MED ORDER — PROPOFOL 500 MG/50ML IV EMUL
INTRAVENOUS | Status: AC
  Filled 2022-02-19: qty 50

## 2022-02-19 MED ORDER — MENTHOL 3 MG MT LOZG
1.0000 | LOZENGE | OROMUCOSAL | Status: DC | PRN
Start: 2022-02-19 — End: 2022-02-22

## 2022-02-19 MED ORDER — CEFAZOLIN SODIUM-DEXTROSE 2-4 GM/100ML-% IV SOLN
2.0000 g | Freq: Four times a day (QID) | INTRAVENOUS | Status: AC
  Administered 2022-02-19 (×2): 2 g via INTRAVENOUS
  Filled 2022-02-19 (×2): qty 100

## 2022-02-19 MED ORDER — CEFAZOLIN SODIUM-DEXTROSE 2-4 GM/100ML-% IV SOLN
2.0000 g | INTRAVENOUS | Status: AC
  Administered 2022-02-19: 2 g via INTRAVENOUS
  Filled 2022-02-19: qty 100

## 2022-02-19 MED ORDER — AMLODIPINE BESYLATE 10 MG PO TABS
10.0000 mg | ORAL_TABLET | Freq: Every day | ORAL | Status: DC
  Administered 2022-02-19 – 2022-02-22 (×4): 10 mg via ORAL
  Filled 2022-02-19 (×4): qty 1

## 2022-02-19 MED ORDER — METOCLOPRAMIDE HCL 5 MG PO TABS: 5.0000 mg | ORAL_TABLET | Freq: Three times a day (TID) | ORAL | Status: DC | PRN

## 2022-02-19 MED ORDER — ORAL CARE MOUTH RINSE
15.0000 mL | Freq: Once | OROMUCOSAL | Status: AC
Start: 2022-02-19 — End: 2022-02-19

## 2022-02-19 MED ORDER — POLYETHYLENE GLYCOL 3350 17 G PO PACK
17.0000 g | PACK | Freq: Every day | ORAL | Status: DC | PRN
Start: 2022-02-19 — End: 2022-02-22

## 2022-02-19 MED ORDER — ONDANSETRON HCL 4 MG PO TABS: 4.0000 mg | ORAL_TABLET | Freq: Four times a day (QID) | ORAL | Status: DC | PRN

## 2022-02-19 MED ORDER — PHENYLEPHRINE HCL-NACL 20-0.9 MG/250ML-% IV SOLN
INTRAVENOUS | Status: DC | PRN
  Administered 2022-02-19: 40 ug/min via INTRAVENOUS

## 2022-02-19 SURGICAL SUPPLY — 63 items
BAG COUNTER SPONGE SURGICOUNT (BAG) IMPLANT
BAG ZIPLOCK 12X15 (MISCELLANEOUS) IMPLANT
BATTERY INSTRU NAVIGATION (MISCELLANEOUS) ×3 IMPLANT
BLADE SAW RECIPROCATING 77.5 (BLADE) ×1 IMPLANT
BNDG ELASTIC 4X5.8 VLCR STR LF (GAUZE/BANDAGES/DRESSINGS) ×1 IMPLANT
BNDG ELASTIC 6X5.8 VLCR STR LF (GAUZE/BANDAGES/DRESSINGS) ×1 IMPLANT
CHLORAPREP W/TINT 26 (MISCELLANEOUS) ×2 IMPLANT
COMP FEM KNEE STD PS 7 LT (Joint) ×1 IMPLANT
COMPONENT FEM KNEE STD PS 7 LT (Joint) IMPLANT
COVER SURGICAL LIGHT HANDLE (MISCELLANEOUS) ×1 IMPLANT
DERMABOND ADVANCED (GAUZE/BANDAGES/DRESSINGS) ×1
DERMABOND ADVANCED .7 DNX12 (GAUZE/BANDAGES/DRESSINGS) ×2 IMPLANT
DRAPE INCISE IOBAN 66X45 STRL (DRAPES) ×1 IMPLANT
DRAPE SHEET LG 3/4 BI-LAMINATE (DRAPES) ×3 IMPLANT
DRAPE U-SHAPE 47X51 STRL (DRAPES) ×1 IMPLANT
DRSG AQUACEL AG ADV 3.5X10 (GAUZE/BANDAGES/DRESSINGS) ×1 IMPLANT
ELECT BLADE TIP CTD 4 INCH (ELECTRODE) ×1 IMPLANT
ELECT REM PT RETURN 15FT ADLT (MISCELLANEOUS) ×1 IMPLANT
GAUZE SPONGE 4X4 12PLY STRL (GAUZE/BANDAGES/DRESSINGS) ×1 IMPLANT
GLOVE BIO SURGEON STRL SZ7 (GLOVE) ×1 IMPLANT
GLOVE BIO SURGEON STRL SZ8.5 (GLOVE) ×2 IMPLANT
GLOVE BIOGEL PI IND STRL 7.5 (GLOVE) ×1 IMPLANT
GLOVE BIOGEL PI IND STRL 8.5 (GLOVE) ×1 IMPLANT
GOWN SPEC L3 XXLG W/TWL (GOWN DISPOSABLE) ×1 IMPLANT
GOWN STRL REUS W/ TWL XL LVL3 (GOWN DISPOSABLE) ×1 IMPLANT
GOWN STRL REUS W/TWL XL LVL3 (GOWN DISPOSABLE) ×1
HANDPIECE INTERPULSE COAX TIP (DISPOSABLE) ×1
HDLS TROCR DRIL PIN KNEE 75 (PIN) ×1
HOLDER FOLEY CATH W/STRAP (MISCELLANEOUS) ×1 IMPLANT
HOOD PEEL AWAY FLYTE STAYCOOL (MISCELLANEOUS) ×3 IMPLANT
IMPL PATELLA METAL SZ32X10 (Joint) IMPLANT
KIT TURNOVER KIT A (KITS) IMPLANT
LINER TIB ASF PS EF/3-11 12 L (Liner) IMPLANT
MARKER SKIN DUAL TIP RULER LAB (MISCELLANEOUS) ×1 IMPLANT
NDL SAFETY ECLIP 18X1.5 (MISCELLANEOUS) ×1 IMPLANT
NDL SPNL 18GX3.5 QUINCKE PK (NEEDLE) ×1 IMPLANT
NEEDLE SPNL 18GX3.5 QUINCKE PK (NEEDLE) ×1 IMPLANT
NS IRRIG 1000ML POUR BTL (IV SOLUTION) ×1 IMPLANT
PACK TOTAL KNEE CUSTOM (KITS) ×1 IMPLANT
PADDING CAST COTTON 6X4 STRL (CAST SUPPLIES) ×1 IMPLANT
PIN DRILL HDLS TROCAR 75 4PK (PIN) IMPLANT
PROTECTOR NERVE ULNAR (MISCELLANEOUS) ×1 IMPLANT
SAW OSC TIP CART 19.5X105X1.3 (SAW) ×1 IMPLANT
SEALER BIPOLAR AQUA 6.0 (INSTRUMENTS) ×1 IMPLANT
SET HNDPC FAN SPRY TIP SCT (DISPOSABLE) ×1 IMPLANT
SET PAD KNEE POSITIONER (MISCELLANEOUS) ×1 IMPLANT
SOLUTION PRONTOSAN WOUND 350ML (IRRIGATION / IRRIGATOR) IMPLANT
SPIKE FLUID TRANSFER (MISCELLANEOUS) ×2 IMPLANT
STEM TIB PERS SZ E 5D LT (Screw) IMPLANT
SUT MNCRL AB 3-0 PS2 18 (SUTURE) ×1 IMPLANT
SUT MNCRL AB 4-0 PS2 18 (SUTURE) IMPLANT
SUT MON AB 2-0 CT1 36 (SUTURE) ×1 IMPLANT
SUT STRATAFIX PDO 1 14 VIOLET (SUTURE) ×1
SUT STRATFX PDO 1 14 VIOLET (SUTURE) ×1
SUT VIC AB 1 CTX 36 (SUTURE) ×2
SUT VIC AB 1 CTX36XBRD ANBCTR (SUTURE) ×2 IMPLANT
SUT VIC AB 2-0 CT1 27 (SUTURE)
SUT VIC AB 2-0 CT1 TAPERPNT 27 (SUTURE) IMPLANT
SUTURE STRATFX PDO 1 14 VIOLET (SUTURE) ×1 IMPLANT
TRAY FOLEY MTR SLVR 14FR STAT (SET/KITS/TRAYS/PACK) IMPLANT
TRAY FOLEY MTR SLVR 16FR STAT (SET/KITS/TRAYS/PACK) IMPLANT
TUBE SUCTION HIGH CAP CLEAR NV (SUCTIONS) ×1 IMPLANT
WATER STERILE IRR 1000ML POUR (IV SOLUTION) ×2 IMPLANT

## 2022-02-19 NOTE — Anesthesia Postprocedure Evaluation (Signed)
Anesthesia Post Note  Patient: Maureen Swanson  Procedure(s) Performed: COMPUTER ASSISTED TOTAL KNEE ARTHROPLASTY (Left: Knee)     Patient location during evaluation: PACU Anesthesia Type: Spinal Level of consciousness: awake Pain management: pain level controlled Vital Signs Assessment: post-procedure vital signs reviewed and stable Respiratory status: spontaneous breathing Cardiovascular status: stable Postop Assessment: no headache, no backache, spinal receding, patient able to bend at knees and no apparent nausea or vomiting Anesthetic complications: no   No notable events documented.  Last Vitals:  Vitals:   02/19/22 1600 02/19/22 1631  BP: 134/88 (!) 154/82  Pulse: 63 60  Resp: 13 16  Temp:  36.9 C  SpO2: 94% 97%    Last Pain:  Vitals:   02/19/22 1631  TempSrc:   PainSc: 0-No pain    LLE Motor Response: Purposeful movement (02/19/22 1644) LLE Sensation: Tingling;Numbness (02/19/22 1644) RLE Motor Response: Purposeful movement (02/19/22 1644) RLE Sensation: Numbness;Tingling (02/19/22 1644)      Huston Foley

## 2022-02-19 NOTE — Anesthesia Procedure Notes (Addendum)
Anesthesia Regional Block: Adductor canal block   Pre-Anesthetic Checklist: , timeout performed,  Correct Patient, Correct Site, Correct Laterality,  Correct Procedure, Correct Position, site marked,  Risks and benefits discussed,  Surgical consent,  Pre-op evaluation,  At surgeon's request and post-op pain management  Laterality: Lower and Left  Prep: chloraprep       Needles:  Injection technique: Single-shot  Needle Type: Echogenic Stimulator Needle     Needle Length: 9cm  Needle Gauge: 20   Needle insertion depth: 3.5 cm   Additional Needles:   Procedures:,,,, ultrasound used (permanent image in chart),,    Narrative:  Start time: 02/19/2022 10:50 AM End time: 02/19/2022 10:58 AM Injection made incrementally with aspirations every 5 mL.  Performed by: Personally  Anesthesiologist: Lyn Hollingshead, MD

## 2022-02-19 NOTE — Op Note (Signed)
OPERATIVE REPORT  SURGEON: Rod Can, MD   ASSISTANT: Larene Pickett, PA-C  PREOPERATIVE DIAGNOSIS: Primary Left knee arthritis.   POSTOPERATIVE DIAGNOSIS: Primary Left knee arthritis.   PROCEDURE: Computer assisted Left total knee arthroplasty.   IMPLANTS: Zimmer Persona PPS Cementless CR femur, size 7. Persona 0 degree Spiked Keel OsseoTi Tibia, size E. Vivacit-E polyethelyene insert, size 12 mm, CR. TM standard patella, size 32 mm.  ANESTHESIA:  MAC, Regional, and Spinal  TOURNIQUET TIME: Not utilized.   ESTIMATED BLOOD LOSS:-300 mL    ANTIBIOTICS: 2g Ancef.  DRAINS: None.  COMPLICATIONS: None   CONDITION: PACU - hemodynamically stable.   BRIEF CLINICAL NOTE: Maureen Swanson is a 56 y.o. female with a long-standing history of Left knee arthritis. After failing conservative management, the patient was indicated for total knee arthroplasty. The risks, benefits, and alternatives to the procedure were explained, and the patient elected to proceed.  PROCEDURE IN DETAIL: Adductor canal block was obtained in the pre-op holding area. Once inside the operative room, spinal anesthesia was obtained, and a foley catheter was inserted. The patient was then positioned and the lower extremity was prepped and draped in the normal sterile surgical fashion.  A time-out was called verifying side and site of surgery. The patient received IV antibiotics within 60 minutes of beginning the procedure. A tourniquet was not utilized.   An anterior approach to the knee was performed utilizing a midvastus arthrotomy. A medial release was performed and the patellar fat pad was excised. Stryker imageless navigation was used to cut the distal femur perpendicular to the mechanical axis. A freehand patellar resection was performed, and the patella was sized an prepared with 3 lug holes.  Nagivation was used to make a neutral proximal tibia resection, taking 10 mm of bone from the less affected lateral side with  3 degrees of slope. The menisci were excised. A spacer block was placed, and the alignment and balance in extension were confirmed.   The distal femur was sized using the 3-degree external rotation guide referencing the posterior femoral cortex. The appropriate 4-in-1 cutting block was pinned into place. Rotation was checked using Whiteside's line, the epicondylar axis, and then confirmed with a spacer block in flexion. The remaining femoral cuts were performed, taking care to protect the MCL.  The tibia was sized and the trial tray was pinned into place. The remaining trail components were inserted. The knee was stable to varus and valgus stress through a full range of motion. The patella tracked centrally, and the PCL was well balanced. The trial components were removed, and the proximal tibial surface was prepared. Final components were impacted into place. The knee was tested for a final time and found to be well balanced.   The wound was copiously irrigated with Irrisept solution and normal saline using pule lavage.  Marcaine solution was injected into the periarticular soft tissue.  The wound was closed in layers using #1 Vicryl and Stratafix for the fascia, 2-0 Vicryl for the subcutaneous fat, 2-0 Monocryl for the deep dermal layer, 3-0 running Monocryl subcuticular Stitch, and 4-0 Monocryl stay sutures at both ends of the wound. Dermabond was applied to the skin.  Once the glue was fully dried, an Aquacell Ag and compressive dressing were applied.  The patient was transported to the recovery room in stable condition.  Sponge, needle, and instrument counts were correct at the end of the case x2.  The patient tolerated the procedure well and there were no known complications.  Please note that a surgical assistant was a medical necessity for this procedure in order to perform it in a safe and expeditious manner. Surgical assistant was necessary to retract the ligaments and vital neurovascular  structures to prevent injury to them and also necessary for proper positioning of the limb to allow for anatomic placement of the prosthesis.

## 2022-02-19 NOTE — Discharge Instructions (Signed)
 Dr. Brian Swinteck Total Joint Specialist [facility] [address] [place], [place] [address] [phone]  TOTAL KNEE REPLACEMENT POSTOPERATIVE DIRECTIONS    Knee Rehabilitation, Guidelines Following Surgery  Results after knee surgery are often greatly improved when you follow the exercise, range of motion and muscle strengthening exercises prescribed by your doctor. Safety measures are also important to protect the knee from further injury. Any time any of these exercises cause you to have increased pain or swelling in your knee joint, decrease the amount until you are comfortable again and slowly increase them. If you have problems or questions, call your caregiver or physical therapist for advice.   WEIGHT BEARING Weight bearing as tolerated with assist device (walker, cane, etc) as directed, use it as long as suggested by your surgeon or therapist, typically at least 4-6 weeks.  HOME CARE INSTRUCTIONS  Remove items at home which could result in a fall. This includes throw rugs or furniture in walking pathways.  Continue medications as instructed at time of discharge. You may have some home medications which will be placed on hold until you complete the course of blood thinner medication.  You may start showering once you are discharged home but do not submerge the incision under water. Just pat the incision dry and apply a dry gauze dressing on daily. Walk with walker as instructed.  You may resume a sexual relationship in one month or when given the OK by your doctor.  Use walker as long as suggested by your caregivers. Avoid periods of inactivity such as sitting longer than an hour when not asleep. This helps prevent blood clots.  You may put full weight on your legs and walk as much as is comfortable.  You may return to work once you are cleared by your doctor.  Do not drive a car for 6 weeks or until released by you surgeon.  Do not drive while  taking narcotics.  Wear the elastic stockings for three weeks following surgery during the day but you may remove then at night. Make sure you keep all of your appointments after your operation with all of your doctors and caregivers. You should call the office at the above phone number and make an appointment for approximately two weeks after the date of your surgery. Do not remove your surgical dressing. The dressing is waterproof; you may take showers in 3 days, but do not take tub baths or submerge the dressing. Please pick up a stool softener and laxative for home use as long as you are requiring pain medications. ICE to the affected knee every three hours for 30 minutes at a time and then as needed for pain and swelling.  Continue to use ice on the knee for pain and swelling from surgery. You may notice swelling that will progress down to the foot and ankle.  This is normal after surgery.  Elevate the leg when you are not up walking on it.   It is important for you to complete the blood thinner medication as prescribed by your doctor. Continue to use the breathing machine which will help keep your temperature down.  It is common for your temperature to cycle up and down following surgery, especially at night when you are not up moving around and exerting yourself.  The breathing machine keeps your lungs expanded and your temperature down.  RANGE OF MOTION AND STRENGTHENING EXERCISES  Rehabilitation of the knee is important following a knee injury or an   operation. After just a few days of immobilization, the muscles of the thigh which control the knee become weakened and shrink (atrophy). Knee exercises are designed to build up the tone and strength of the thigh muscles and to improve knee motion. Often times heat used for twenty to thirty minutes before working out will loosen up your tissues and help with improving the range of motion but do not use heat for the first two weeks following surgery.  These exercises can be done on a training (exercise) mat, on the floor, on a table or on a bed. Use what ever works the best and is most comfortable for you Knee exercises include:  Leg Lifts - While your knee is still immobilized in a splint or cast, you can do straight leg raises. Lift the leg to 60 degrees, hold for 3 sec, and slowly lower the leg. Repeat 10-20 times 2-3 times daily. Perform this exercise against resistance later as your knee gets better.  Quad and Hamstring Sets - Tighten up the muscle on the front of the thigh (Quad) and hold for 5-10 sec. Repeat this 10-20 times hourly. Hamstring sets are done by pushing the foot backward against an object and holding for 5-10 sec. Repeat as with quad sets.  A rehabilitation program following serious knee injuries can speed recovery and prevent re-injury in the future due to weakened muscles. Contact your doctor or a physical therapist for more information on knee rehabilitation.   POST-OPERATIVE OPIOID TAPER INSTRUCTIONS: It is important to wean off of your opioid medication as soon as possible. If you do not need pain medication after your surgery it is ok to stop day one. Opioids include: Codeine, Hydrocodone(Norco, Vicodin), Oxycodone(Percocet, oxycontin) and hydromorphone amongst others.  Long term and even short term use of opiods can cause: Increased pain response Dependence Constipation Depression Respiratory depression And more.  Withdrawal symptoms can include Flu like symptoms Nausea, vomiting And more Techniques to manage these symptoms Hydrate well Eat regular healthy meals Stay active Use relaxation techniques(deep breathing, meditating, yoga) Do Not substitute Alcohol to help with tapering If you have been on opioids for less than two weeks and do not have pain than it is ok to stop all together.  Plan to wean off of opioids This plan should start within one week post op of your joint replacement. Maintain the same  interval or time between taking each dose and first decrease the dose.  Cut the total daily intake of opioids by one tablet each day Next start to increase the time between doses. The last dose that should be eliminated is the evening dose.    SKILLED REHAB INSTRUCTIONS: If the patient is transferred to a skilled rehab facility following release from the hospital, a list of the current medications will be sent to the facility for the patient to continue.  When discharged from the skilled rehab facility, please have the facility set up the patient's Home Health Physical Therapy prior to being released. Also, the skilled facility will be responsible for providing the patient with their medications at time of release from the facility to include their pain medication, the muscle relaxants, and their blood thinner medication. If the patient is still at the rehab facility at time of the two week follow up appointment, the skilled rehab facility will also need to assist the patient in arranging follow up appointment in our office and any transportation needs.  MAKE SURE YOU:  Understand these instructions.  Will watch   your condition.  Will get help right away if you are not doing well or get worse.    Pick up stool softner and laxative for home use following surgery while on pain medications. Do NOT remove your dressing. You may shower.  Do not take tub baths or submerge incision under water. May shower starting three days after surgery. Please use a clean towel to pat the incision dry following showers. Continue to use ice for pain and swelling after surgery. Do not use any lotions or creams on the incision until instructed by your surgeon.  

## 2022-02-19 NOTE — Interval H&P Note (Signed)
History and Physical Interval Note:  02/19/2022 10:11 AM  Maureen Swanson  has presented today for surgery, with the diagnosis of Left knee osteoarthritis.  The various methods of treatment have been discussed with the patient and family. After consideration of risks, benefits and other options for treatment, the patient has consented to  Procedure(s): COMPUTER ASSISTED TOTAL KNEE ARTHROPLASTY (Left) as a surgical intervention.  The patient's history has been reviewed, patient examined, no change in status, stable for surgery.  I have reviewed the patient's chart and labs.  Questions were answered to the patient's satisfaction.     Hilton Cork Garett Tetzloff

## 2022-02-19 NOTE — Anesthesia Procedure Notes (Signed)
Procedure Name: MAC Date/Time: 02/19/2022 11:34 AM  Performed by: Niel Hummer, CRNAPre-anesthesia Checklist: Patient identified, Emergency Drugs available, Suction available and Patient being monitored Oxygen Delivery Method: Simple face mask

## 2022-02-19 NOTE — Plan of Care (Signed)
  Problem: Activity: Goal: Ability to avoid complications of mobility impairment will improve Outcome: Progressing Goal: Range of joint motion will improve Outcome: Progressing   Problem: Pain Management: Goal: Pain level will decrease with appropriate interventions Outcome: Progressing   

## 2022-02-19 NOTE — Transfer of Care (Signed)
Immediate Anesthesia Transfer of Care Note  Patient: Maureen Swanson  Procedure(s) Performed: COMPUTER ASSISTED TOTAL KNEE ARTHROPLASTY (Left: Knee)  Patient Location: PACU  Anesthesia Type:Spinal  Level of Consciousness: awake, alert  and oriented  Airway & Oxygen Therapy: Patient Spontanous Breathing and Patient connected to face mask oxygen  Post-op Assessment: Report given to RN and Post -op Vital signs reviewed and stable  Post vital signs: Reviewed and stable  Last Vitals:  Vitals Value Taken Time  BP 116/76   Temp    Pulse 61 02/19/22 1427  Resp 14   SpO2 98 % 02/19/22 1427  Vitals shown include unvalidated device data.  Last Pain:  Vitals:   02/19/22 1055  TempSrc:   PainSc: 0-No pain         Complications: No notable events documented.

## 2022-02-19 NOTE — Evaluation (Signed)
Physical Therapy Evaluation Patient Details Name: Maureen Swanson MRN: [id number] DOB: [birth year] Today's Date: 02/19/2022  History of Present Illness  Pt is a 56yo female presenting s/p L-TKA on 02/19/22. PMH: anxiety & depression, bipolar, CHF, DM with peripheral neuropathy, GERD, gout, HTN, HLD, hx of CVA 1995, hx of substance abuse,  OSA on CPAP  Clinical Impression  Maureen Swanson is a 56 y.o. female POD 0 s/p L-TKA. Patient reports independence with mobility at baseline. Patient is now limited by functional impairments (see PT problem list below) and requires min assist for bed mobility and min guard for transfers, further mobility deferred secondary to pain and pt unable to perform a quality SLR. Patient instructed in exercise to facilitate ROM and circulation to manage edema. Provided incentive spirometer and with Vcs pt able to achieve 1025m. Patient will benefit from continued skilled PT interventions to address impairments and progress towards PLOF. Acute PT will follow to progress mobility and stair training in preparation for safe discharge home.   OF NOTE: pt will be staying with friend BHorris Latinofor two weeks post-discharge; does not remember home environment information but promised to call BHorris Latinoand ask prior to PT session tomorrow morning. Home environment information below pertains to pt's residence. Also, pt requested crutches (in addition to RW); educated pt that this is decreasing in safety and stability and give stroke history I do not recommend, pt verbalized understanding.     Recommendations for follow up therapy are one component of a multi-disciplinary discharge planning process, led by the attending physician.  Recommendations may be updated based on patient status, additional functional criteria and insurance authorization.  Follow Up Recommendations Follow physician's recommendations for discharge plan and follow up therapies      Assistance Recommended at Discharge  Intermittent Supervision/Assistance  Patient can return home with the following  A little help with walking and/or transfers;A little help with bathing/dressing/bathroom;Assistance with cooking/housework;Assist for transportation;Help with stairs or ramp for entrance    Equipment Recommendations Rolling walker (2 wheels);  Recommendations for Other Services       Functional Status Assessment Patient has had a recent decline in their functional status and demonstrates the ability to make significant improvements in function in a reasonable and predictable amount of time.     Precautions / Restrictions Precautions Precautions: Fall Precaution Comments: NO PILLOW UNDER KNEE Restrictions Weight Bearing Restrictions: No Other Position/Activity Restrictions: WBAT      Mobility  Bed Mobility Overal bed mobility: Needs Assistance Bed Mobility: Supine to Sit     Supine to sit: Min assist     General bed mobility comments: Min assist to bring LLE off bed    Transfers Overall transfer level: Needs assistance Equipment used: Rolling walker (2 wheels) Transfers: Sit to/from Stand, Bed to chair/wheelchair/BSC Sit to Stand: Min assist, From elevated surface   Step pivot transfers: Min guard       General transfer comment: Sit to stand to transfer: first attempt, required min assist for lift assist. Second attempt, pt able to complete with min guard. Pt completed standing weight shifts x5 and marchesx2 prior to step pivot transfer.Further mobility deferred.    Ambulation/Gait                  Stairs            Wheelchair Mobility    Modified Rankin (Stroke Patients Only)       Balance Overall balance assessment: Needs assistance Sitting-balance support: Feet supported, No  upper extremity supported Sitting balance-Leahy Scale: Good     Standing balance support: Reliant on assistive device for balance, During functional activity, Bilateral upper extremity  supported Standing balance-Leahy Scale: Poor                               Pertinent Vitals/Pain Pain Assessment Pain Assessment: 0-10 Pain Score: 7  Pain Location: left knee Pain Descriptors / Indicators: Operative site guarding, Discomfort Pain Intervention(s): Limited activity within patient's tolerance, Monitored during session, Repositioned, Ice applied    Home Living Family/patient expects to be discharged to:: Private residence Living Arrangements: Alone Available Help at Discharge: Friend(s);Available 24 hours/day Loma Sender) Type of Home: Apartment Home Access: Stairs to enter Entrance Stairs-Rails: Left Entrance Stairs-Number of Steps: 5 Alternate Level Stairs-Number of Steps: 5 Home Layout: Multi-level Home Equipment: None Additional Comments: Home environment pertains to pt's home; pt will stay with friend [name], doesn't remember her home information. Pt Will try to call her before PT tomorrow.    Prior Function Prior Level of Function : Independent/Modified Independent             Mobility Comments: IND ADLs Comments: IND     Hand Dominance   Dominant Hand: Left    Extremity/Trunk Assessment   Upper Extremity Assessment Upper Extremity Assessment: LUE deficits/detail LUE Deficits / Details: Pt has residual strength deficits from previous stroke. LUE shakes at baseline LUE Sensation: WNL    Lower Extremity Assessment Lower Extremity Assessment: RLE deficits/detail;LLE deficits/detail RLE Deficits / Details: MMT ank DF/PF 4/5 RLE Sensation: WNL LLE Deficits / Details: MMT ank DF/PF 3+/5, pt able to perform SLR to ~1inch. Pt has residual strength deficits from prior stroke LLE Sensation: WNL    Cervical / Trunk Assessment Cervical / Trunk Assessment: Kyphotic  Communication   Communication: No difficulties  Cognition Arousal/Alertness: Awake/alert Behavior During Therapy: WFL for tasks assessed/performed Overall Cognitive  Status: Within Functional Limits for tasks assessed                                          General Comments      Exercises Total Joint Exercises Ankle Circles/Pumps: AROM, Both, 20 reps   Assessment/Plan    PT Assessment Patient needs continued PT services  PT Problem List Decreased strength;Decreased range of motion;Decreased activity tolerance;Decreased balance;Decreased mobility;Decreased coordination;Pain       PT Treatment Interventions DME instruction;Gait training;Stair training;Functional mobility training;Therapeutic activities;Therapeutic exercise;Balance training;Neuromuscular re-education;Patient/family education    PT Goals (Current goals can be found in the Care Plan section)  Acute Rehab PT Goals Patient Stated Goal: Walk without pain PT Goal Formulation: With patient Time For Goal Achievement: 02/26/22 Potential to Achieve Goals: Good    Frequency 7X/week     Co-evaluation               AM-PAC PT "6 Clicks" Mobility  Outcome Measure Help needed turning from your back to your side while in a flat bed without using bedrails?: None Help needed moving from lying on your back to sitting on the side of a flat bed without using bedrails?: A Little Help needed moving to and from a bed to a chair (including a wheelchair)?: A Little Help needed standing up from a chair using your arms (e.g., wheelchair or bedside chair)?: A Little Help needed to walk in  hospital room?: A Little Help needed climbing 3-5 steps with a railing? : A Lot 6 Click Score: 18    End of Session Equipment Utilized During Treatment: Gait belt Activity Tolerance: Patient tolerated treatment well;No increased pain Patient left: in chair;with call bell/phone within reach;with chair alarm set;with nursing/sitter in room;with SCD's reapplied Nurse Communication: Mobility status PT Visit Diagnosis: Pain;Difficulty in walking, not elsewhere classified (R26.2) Pain -  Right/Left: Left Pain - part of body: Knee    Time: 0165-5374 PT Time Calculation (min) (ACUTE ONLY): 33 min   Charges:   PT Evaluation $PT Eval Low Complexity: 1 Low PT Treatments $Therapeutic Activity: 8-22 mins        Coolidge Breeze, PT, DPT [facility] Department Office: [phone] Pager: [phone]  Coolidge Breeze 02/19/2022, 6:44 PM

## 2022-02-19 NOTE — Progress Notes (Signed)
During preop checklist patient states she agrees to receive blood products to RN. Patient's sister Sumie Remsen arrives to visit patient prior to surgery, sister states to another nurse patient does not wish to receive blood products.  Dr. Lyla Glassing explained the purpose of blood products in an emergency situation.  Patient then states she agrees to receive blood products however patients sister does not wish for patient to receive blood products.  Dr. Lyla Glassing asked patient several additional times her wishes with receiving blood products, patient agrees to receive blood products.

## 2022-02-19 NOTE — Anesthesia Procedure Notes (Signed)
Spinal  Patient location during procedure: OR Start time: 02/19/2022 11:36 AM End time: 02/19/2022 11:40 AM Reason for block: surgical anesthesia Staffing Performed: resident/CRNA  Resident/CRNA: Niel Hummer, CRNA Performed by: Niel Hummer, CRNA Authorized by: Lyn Hollingshead, MD   Preanesthetic Checklist Completed: patient identified, IV checked, risks and benefits discussed, surgical consent, monitors and equipment checked, pre-op evaluation and timeout performed Spinal Block Patient position: sitting Prep: DuraPrep Patient monitoring: heart rate, cardiac monitor, continuous pulse ox and blood pressure Approach: midline Location: L3-4 Injection technique: single-shot Needle Needle type: Pencan  Needle gauge: 24 G Needle length: 10 cm Additional Notes Expiration date of kit checked. Clear CSF flow prior to injection. Dr. Jillyn Hidden present.

## 2022-02-20 ENCOUNTER — Encounter (HOSPITAL_COMMUNITY): Payer: Self-pay | Admitting: Orthopedic Surgery

## 2022-02-20 DIAGNOSIS — M1712 Unilateral primary osteoarthritis, left knee: Secondary | ICD-10-CM | POA: Diagnosis not present

## 2022-02-20 LAB — CBC
HCT: 33.4 % — ABNORMAL LOW (ref 36.0–46.0)
Hemoglobin: 10.2 g/dL — ABNORMAL LOW (ref 12.0–15.0)
MCH: 29.2 pg (ref 26.0–34.0)
MCHC: 30.5 g/dL (ref 30.0–36.0)
MCV: 95.7 fL (ref 80.0–100.0)
Platelets: 205 10*3/uL (ref 150–400)
RBC: 3.49 MIL/uL — ABNORMAL LOW (ref 3.87–5.11)
RDW: 11.9 % (ref 11.5–15.5)
WBC: 9 10*3/uL (ref 4.0–10.5)
nRBC: 0 % (ref 0.0–0.2)

## 2022-02-20 LAB — GLUCOSE, CAPILLARY
Glucose-Capillary: 186 mg/dL — ABNORMAL HIGH (ref 70–99)
Glucose-Capillary: 147 mg/dL — ABNORMAL HIGH (ref 70–99)
Glucose-Capillary: 216 mg/dL — ABNORMAL HIGH (ref 70–99)
Glucose-Capillary: 220 mg/dL — ABNORMAL HIGH (ref 70–99)

## 2022-02-20 LAB — BASIC METABOLIC PANEL
Anion gap: 8 (ref 5–15)
BUN: 16 mg/dL (ref 6–20)
CO2: 24 mmol/L (ref 22–32)
Calcium: 9.1 mg/dL (ref 8.9–10.3)
Chloride: 105 mmol/L (ref 98–111)
Creatinine, Ser: 0.97 mg/dL (ref 0.44–1.00)
GFR, Estimated: >60 mL/min (ref >60)
Glucose, Bld: 238 mg/dL — ABNORMAL HIGH (ref 70–99)
Potassium: 4.3 mmol/L (ref 3.5–5.1)
Sodium: 137 mmol/L (ref 135–145)

## 2022-02-20 MED ORDER — OXYCODONE HCL 5 MG PO TABS: 5.0000 mg | ORAL_TABLET | ORAL | 0 refills | Status: AC | PRN

## 2022-02-20 MED ORDER — POLYETHYLENE GLYCOL 3350 17 G PO PACK: 17.0000 g | PACK | Freq: Every day | ORAL | 0 refills | Status: AC | PRN

## 2022-02-20 MED ORDER — MELOXICAM 15 MG PO TABS: 15.0000 mg | ORAL_TABLET | Freq: Every day | ORAL | 2 refills | Status: DC

## 2022-02-20 MED ORDER — DOCUSATE SODIUM 100 MG PO CAPS: 100.0000 mg | ORAL_CAPSULE | Freq: Two times a day (BID) | ORAL | 0 refills | Status: DC

## 2022-02-20 MED ORDER — POLYETHYLENE GLYCOL 3350 17 G PO PACK
17.0000 g | PACK | Freq: Every day | ORAL | 0 refills | Status: DC | PRN
Start: 2022-02-20 — End: 2022-02-20

## 2022-02-20 MED ORDER — ONDANSETRON HCL 4 MG PO TABS: 4.0000 mg | ORAL_TABLET | Freq: Three times a day (TID) | ORAL | 0 refills | Status: DC | PRN

## 2022-02-20 MED ORDER — ASPIRIN 81 MG PO CHEW: 81.0000 mg | CHEWABLE_TABLET | Freq: Two times a day (BID) | ORAL | 0 refills | Status: DC

## 2022-02-20 MED ORDER — LOSARTAN POTASSIUM 50 MG PO TABS
100.0000 mg | ORAL_TABLET | Freq: Every day | ORAL | Status: DC
  Administered 2022-02-20 – 2022-02-22 (×3): 100 mg via ORAL
  Filled 2022-02-20 (×3): qty 2

## 2022-02-20 MED ORDER — ASPIRIN 81 MG PO CHEW: 81.0000 mg | CHEWABLE_TABLET | Freq: Two times a day (BID) | ORAL | 0 refills | Status: AC

## 2022-02-20 MED ORDER — OXYCODONE HCL 5 MG PO TABS: 5.0000 mg | ORAL_TABLET | ORAL | 0 refills | Status: DC | PRN

## 2022-02-20 MED ORDER — HYDROCHLOROTHIAZIDE 25 MG PO TABS
25.0000 mg | ORAL_TABLET | Freq: Every day | ORAL | Status: DC
  Administered 2022-02-20 – 2022-02-22 (×3): 25 mg via ORAL
  Filled 2022-02-20 (×3): qty 1

## 2022-02-20 MED ORDER — SENNA 8.6 MG PO TABS
2.0000 | ORAL_TABLET | Freq: Every day | ORAL | 0 refills | Status: DC
Start: 2022-02-20 — End: 2022-02-20

## 2022-02-20 MED ORDER — SENNA 8.6 MG PO TABS: 2.0000 | ORAL_TABLET | Freq: Every day | ORAL | 0 refills | Status: AC

## 2022-02-20 NOTE — TOC Transition Note (Signed)
Transition of Care [facility]) - CM/SW Discharge Note   Patient Details  Name: Maureen Swanson MRN: [id number] Date of Birth: 07/02/1965  Transition of Care [facility]) CM/SW Contact:  Servando Snare, LCSW Phone Number: 02/20/2022, 11:12 AM   Clinical Narrative:   Patient to dc home. DME arranged. OPPT with [facility] on [facility].     Final next level of care: Home/Self Care Barriers to Discharge: No Barriers Identified   Patient Goals and CMS Choice     Choice offered to / list presented to : NA  Discharge Placement                       Discharge Plan and Services                DME Arranged: Walker rolling DME Agency: [employer]       HH Arranged: NA HH Agency: NA        Social Determinants of Health (SDOH) Interventions     Readmission Risk Interventions     No data to display

## 2022-02-20 NOTE — Progress Notes (Signed)
Physical Therapy Treatment Patient Details Name: Maureen Swanson MRN: [id number] DOB: [birth year] Today's Date: 02/20/2022   History of Present Illness Pt is a 56yo female presenting s/p L-TKA on 02/19/22. PMH: anxiety & depression, bipolar, CHF, DM with peripheral neuropathy, GERD, gout, HTN, HLD, hx of CVA 1995, hx of substance abuse,  OSA on CPAP    PT Comments    The patient  recently received pain medication but willing to progress with exercises and ambulation. Patient tolerated well.Patient plans Dc tomorrow. Has 3 STE with left rail.    Recommendations for follow up therapy are one component of a multi-disciplinary discharge planning process, led by the attending physician.  Recommendations may be updated based on patient status, additional functional criteria and insurance authorization.  Follow Up Recommendations  Follow physician's recommendations for discharge plan and follow up therapies     Assistance Recommended at Discharge Intermittent Supervision/Assistance  Patient can return home with the following A little help with walking and/or transfers;A little help with bathing/dressing/bathroom;Assistance with cooking/housework;Assist for transportation;Help with stairs or ramp for entrance   Equipment Recommendations  Rolling walker (2 wheels);Other (comment)    Recommendations for Other Services       Precautions / Restrictions Precautions Precautions: Fall;Knee Precaution Comments: NO PILLOW UNDER KNEE Restrictions Weight Bearing Restrictions: No LLE Weight Bearing: Weight bearing as tolerated     Mobility  Bed Mobility   Bed Mobility: Sit to Supine       Sit to supine: Supervision   General bed mobility comments: cues for sequence and use of belt to lift left leg onto bed to self assist    Transfers Overall transfer level: Needs assistance Equipment used: Rolling walker (2 wheels) Transfers: Sit to/from Stand Sit to Stand: Min guard           General  transfer comment: cues for LE management and use of UEs to self assist    Ambulation/Gait Ambulation/Gait assistance: Min assist, Min guard Gait Distance (Feet): 55 Feet Assistive device: Rolling walker (2 wheels) Gait Pattern/deviations: Step-to pattern, Decreased step length - right, Decreased step length - left, Shuffle, Trunk flexed Gait velocity: decr     General Gait Details: cues for sequence, stride length, posture and position from RW, 2 episodes of slight knee buckle.   Stairs             Wheelchair Mobility    Modified Rankin (Stroke Patients Only)       Balance   Sitting-balance support: Feet supported, No upper extremity supported Sitting balance-Leahy Scale: Good     Standing balance support: Bilateral upper extremity supported, During functional activity Standing balance-Leahy Scale: Fair                              Cognition Arousal/Alertness: Awake/alert Behavior During Therapy: WFL for tasks assessed/performed Overall Cognitive Status: Within Functional Limits for tasks assessed                                          Exercises Total Joint Exercises Ankle Circles/Pumps: AROM Quad Sets: AROM, Both, 10 reps, Supine Heel Slides: AAROM, Left, 15 reps, Supine Hip ABduction/ADduction: AAROM, Left, 10 reps, Supine Straight Leg Raises: AAROM, AROM, Left, 10 reps, Supine Goniometric ROM: 10-50 left knee flex    General Comments        Pertinent  Vitals/Pain Pain Assessment Pain Location: left knee Pain Descriptors / Indicators: Operative site guarding, Discomfort Pain Intervention(s): Monitored during session, Premedicated before session, Ice applied    Home Living                          Prior Function            PT Goals (current goals can now be found in the care plan section) Acute Rehab PT Goals Patient Stated Goal: Walk without pain PT Goal Formulation: With patient Time For Goal  Achievement: 02/26/22 Potential to Achieve Goals: Good Progress towards PT goals: Progressing toward goals    Frequency    7X/week      PT Plan Current plan remains appropriate    Co-evaluation              AM-PAC PT "6 Clicks" Mobility   Outcome Measure  Help needed turning from your back to your side while in a flat bed without using bedrails?: None Help needed moving from lying on your back to sitting on the side of a flat bed without using bedrails?: A Little Help needed moving to and from a bed to a chair (including a wheelchair)?: A Little Help needed standing up from a chair using your arms (e.g., wheelchair or bedside chair)?: A Little Help needed to walk in hospital room?: A Little Help needed climbing 3-5 steps with a railing? : A Lot 6 Click Score: 18    End of Session Equipment Utilized During Treatment: Gait belt Activity Tolerance: Patient tolerated treatment well Patient left: in bed;with call bell/phone within reach Nurse Communication: Mobility status PT Visit Diagnosis: Pain;Difficulty in walking, not elsewhere classified (R26.2) Pain - Right/Left: Left Pain - part of body: Knee     Time: 2376-2831 PT Time Calculation (min) (ACUTE ONLY): 30 min  Charges:  $Gait Training: 8-22 mins $Therapeutic Exercise: 8-22 mins                     [place] Office [phone] Weekend pager-[phone]    Claretha Cooper 02/20/2022, 2:49 PM

## 2022-02-20 NOTE — Plan of Care (Signed)
  Problem: Pain Management: Goal: Pain level will decrease with appropriate interventions Outcome: Progressing   Problem: Skin Integrity: Goal: Will show signs of wound healing Outcome: Progressing   Problem: Clinical Measurements: Goal: Respiratory complications will improve Outcome: Progressing   Problem: Clinical Measurements: Goal: Cardiovascular complication will be avoided Outcome: Progressing

## 2022-02-20 NOTE — Progress Notes (Signed)
Pts blood pressure has been elevated today.  PA [facility] notified.  She came in to see the patient.  2 home blood pressure medications ordered and given.

## 2022-02-20 NOTE — Progress Notes (Signed)
Physical Therapy Treatment Patient Details Name: Maureen Swanson MRN: [id number] DOB: [birth year] Today's Date: 02/20/2022   History of Present Illness Pt is a 56yo female presenting s/p L-TKA on 02/19/22. PMH: anxiety & depression, bipolar, CHF, DM with peripheral neuropathy, GERD, gout, HTN, HLD, hx of CVA 1995, hx of substance abuse,  OSA on CPAP    PT Comments    Pt is very cooperative and progressing well with mobility.  HEP initiated with written instruction provided.  Pt up to ambulate 75' in hall.  Pt reports friend Maureen Swanson has 3 stairs into home but neglected to ask about handrails - pt states will get information for pm session.  Pt reports first OP PT scheduled for 02/27/22.  Recommendations for follow up therapy are one component of a multi-disciplinary discharge planning process, led by the attending physician.  Recommendations may be updated based on patient status, additional functional criteria and insurance authorization.  Follow Up Recommendations  Follow physician's recommendations for discharge plan and follow up therapies     Assistance Recommended at Discharge Intermittent Supervision/Assistance  Patient can return home with the following A little help with walking and/or transfers;A little help with bathing/dressing/bathroom;Assistance with cooking/housework;Assist for transportation;Help with stairs or ramp for entrance   Equipment Recommendations  Rolling walker (2 wheels);Other (comment)    Recommendations for Other Services       Precautions / Restrictions Precautions Precautions: Fall Precaution Comments: NO PILLOW UNDER KNEE Restrictions Weight Bearing Restrictions: No LLE Weight Bearing: Weight bearing as tolerated     Mobility  Bed Mobility Overal bed mobility: Needs Assistance Bed Mobility: Supine to Sit     Supine to sit: Min guard     General bed mobility comments: cues for sequence and use of R LE to self assist    Transfers Overall transfer  level: Needs assistance Equipment used: Rolling walker (2 wheels) Transfers: Sit to/from Stand Sit to Stand: Min guard           General transfer comment: cues for LE management and use of UEs to self assist    Ambulation/Gait Ambulation/Gait assistance: Min assist, Min guard Gait Distance (Feet): 75 Feet Assistive device: Rolling walker (2 wheels) Gait Pattern/deviations: Step-to pattern, Decreased step length - right, Decreased step length - left, Shuffle, Trunk flexed Gait velocity: decr     General Gait Details: cues for sequence, stride length, posture and position from [employer]             Wheelchair Mobility    Modified Rankin (Stroke Patients Only)       Balance Overall balance assessment: Needs assistance Sitting-balance support: Feet supported, No upper extremity supported Sitting balance-Leahy Scale: Good     Standing balance support: Single extremity supported Standing balance-Leahy Scale: Poor                              Cognition Arousal/Alertness: Awake/alert Behavior During Therapy: WFL for tasks assessed/performed Overall Cognitive Status: Within Functional Limits for tasks assessed                                          Exercises Total Joint Exercises Ankle Circles/Pumps: AROM, Both, 20 reps Quad Sets: AROM, Both, 10 reps, Supine Heel Slides: AAROM, Left, 15 reps, Supine Hip ABduction/ADduction: AAROM, Left, 10 reps, Supine Straight Leg Raises: AAROM,  AROM, Left, 10 reps, Supine Long Arc Quad: AAROM, AROM, Left, 10 reps, Seated    General Comments        Pertinent Vitals/Pain Pain Assessment Pain Assessment: 0-10 Pain Score: 6  Pain Location: left knee Pain Descriptors / Indicators: Operative site guarding, Discomfort Pain Intervention(s): Limited activity within patient's tolerance, Monitored during session, Premedicated before session, Ice applied    Home Living                           Prior Function            PT Goals (current goals can now be found in the care plan section) Acute Rehab PT Goals Patient Stated Goal: Walk without pain PT Goal Formulation: With patient Time For Goal Achievement: 02/26/22 Potential to Achieve Goals: Good Progress towards PT goals: Progressing toward goals    Frequency    7X/week      PT Plan Current plan remains appropriate    Co-evaluation              AM-PAC PT "6 Clicks" Mobility   Outcome Measure  Help needed turning from your back to your side while in a flat bed without using bedrails?: None Help needed moving from lying on your back to sitting on the side of a flat bed without using bedrails?: A Little Help needed moving to and from a bed to a chair (including a wheelchair)?: A Little Help needed standing up from a chair using your arms (e.g., wheelchair or bedside chair)?: A Little Help needed to walk in hospital room?: A Little Help needed climbing 3-5 steps with a railing? : A Lot 6 Click Score: 18    End of Session Equipment Utilized During Treatment: Gait belt Activity Tolerance: Patient tolerated treatment well Patient left: in chair;with call bell/phone within reach;with family/visitor present Nurse Communication: Mobility status PT Visit Diagnosis: Pain;Difficulty in walking, not elsewhere classified (R26.2) Pain - Right/Left: Left Pain - part of body: Knee     Time: 1010-1049 PT Time Calculation (min) (ACUTE ONLY): 39 min  Charges:  $Gait Training: 8-22 mins $Therapeutic Exercise: 8-22 mins                     [name] Pager [phone] Office [phone]    Cheryl Chay 02/20/2022, 1:40 PM

## 2022-02-20 NOTE — Progress Notes (Signed)
    Subjective:  Patient reports pain as mild to moderate.  Denies N/V/CP/SOB/Abd pain. She reports that her pain is controlled this morning. She denies tingling and numbness in LE bilaterally.   Objective:   VITALS:   Vitals:   02/19/22 2126 02/19/22 2148 02/20/22 0152 02/20/22 0541  BP:  (!) 156/100 (!) 141/96 119/88  Pulse: 60 64 62 (!) 54  Resp: _0 Temp:  97.8 F (36.6 C) 97.9 F (36.6 C) 97.9 F (36.6 C)  TempSrc:  Oral Oral Oral  SpO2: 98% 98% 97% 97%  Weight:      Height:        Patient lying comfortably in bed with ice on knee. NAD.  Neurologically intact ABD soft Neurovascular intact Sensation intact distally Intact pulses distally Dorsiflexion/Plantar flexion intact Incision: dressing C/D/I No cellulitis present Compartment soft   Lab Results  Component Value Date   WBC 9.0 02/20/2022   HGB 10.2 (L) 02/20/2022   HCT 33.4 (L) 02/20/2022   MCV 95.7 02/20/2022   PLT 205 02/20/2022   BMET    Component Value Date/Time   NA 137 02/20/2022 0421   NA 140 12/13/2021 1154   K 4.3 02/20/2022 0421   CL 105 02/20/2022 0421   CO2 24 02/20/2022 0421   GLUCOSE 238 (H) 02/20/2022 0421   BUN 16 02/20/2022 0421   BUN 23 12/13/2021 1154   CREATININE 0.97 02/20/2022 0421   CALCIUM 9.1 02/20/2022 0421   EGFR 48 (L) 12/13/2021 1154   GFRNONAA >60 02/20/2022 0421     Assessment/Plan: 1 Day Post-Op   Principal Problem:   Osteoarthritis of left knee  Hgb stable 10.2.   WBAT with walker DVT ppx: Aspirin, SCDs, TEDS PO pain control PT/OT: PT to come by today.  Dispo: D/c home once cleared with PT.    Charlott Rakes, PA-C 02/20/2022, 7:39 AM   [facility]  [place] [address], [place], [place] [address]

## 2022-02-21 DIAGNOSIS — M1712 Unilateral primary osteoarthritis, left knee: Secondary | ICD-10-CM | POA: Diagnosis not present

## 2022-02-21 LAB — GLUCOSE, CAPILLARY
Glucose-Capillary: 205 mg/dL — ABNORMAL HIGH (ref 70–99)
Glucose-Capillary: 179 mg/dL — ABNORMAL HIGH (ref 70–99)
Glucose-Capillary: 160 mg/dL — ABNORMAL HIGH (ref 70–99)
Glucose-Capillary: 194 mg/dL — ABNORMAL HIGH (ref 70–99)

## 2022-02-21 LAB — CBC
HCT: 36.8 % (ref 36.0–46.0)
Hemoglobin: 11.3 g/dL — ABNORMAL LOW (ref 12.0–15.0)
MCH: 29 pg (ref 26.0–34.0)
MCHC: 30.7 g/dL (ref 30.0–36.0)
MCV: 94.6 fL (ref 80.0–100.0)
Platelets: 146 10*3/uL — ABNORMAL LOW (ref 150–400)
RBC: 3.89 MIL/uL (ref 3.87–5.11)
RDW: 11.9 % (ref 11.5–15.5)
WBC: 9.2 10*3/uL (ref 4.0–10.5)
nRBC: 0 % (ref 0.0–0.2)

## 2022-02-21 NOTE — Progress Notes (Signed)
Physical Therapy Treatment Patient Details Name: Maureen Swanson MRN: [id number] DOB: [birth year] Today's Date: 02/21/2022   History of Present Illness Pt is a 56yo female presenting s/p L-TKA on 02/19/22. PMH: anxiety & depression, bipolar, CHF, DM with peripheral neuropathy, GERD, gout, HTN, HLD, hx of CVA 1995, hx of substance abuse,  OSA on CPAP    PT Comments    Pt continues very cooperative but requiring increased time and limited by fatigue.  Pt ambulated limited distance in hall but unable to attempt stairs - " feel shaky and tired".  Will follow up later this am.   Recommendations for follow up therapy are one component of a multi-disciplinary discharge planning process, led by the attending physician.  Recommendations may be updated based on patient status, additional functional criteria and insurance authorization.  Follow Up Recommendations  Follow physician's recommendations for discharge plan and follow up therapies     Assistance Recommended at Discharge Intermittent Supervision/Assistance  Patient can return home with the following A little help with walking and/or transfers;A little help with bathing/dressing/bathroom;Assistance with cooking/housework;Assist for transportation;Help with stairs or ramp for entrance   Equipment Recommendations  Rolling walker (2 wheels);Other (comment)    Recommendations for Other Services       Precautions / Restrictions Precautions Precautions: Fall;Knee Precaution Comments: NO PILLOW UNDER KNEE Restrictions Weight Bearing Restrictions: No LLE Weight Bearing: Weight bearing as tolerated     Mobility  Bed Mobility Overal bed mobility: Needs Assistance Bed Mobility: Supine to Sit     Supine to sit: Supervision     General bed mobility comments: Incresaed time with cues for sequence and use of belt to lift left leg onto bed to self assist    Transfers Overall transfer level: Needs assistance Equipment used: Rolling walker (2  wheels) Transfers: Sit to/from Stand Sit to Stand: Min guard, Supervision           General transfer comment: cues for LE management and use of UEs to self assist    Ambulation/Gait Ambulation/Gait assistance: Min guard Gait Distance (Feet): 32 Feet Assistive device: Rolling walker (2 wheels) Gait Pattern/deviations: Step-to pattern, Decreased step length - right, Decreased step length - left, Shuffle, Trunk flexed Gait velocity: decr     General Gait Details: cues for sequence, stride length, posture and position from RW, 2 episodes of slight knee buckle - distance ltd by fatigue   Stairs             Wheelchair Mobility    Modified Rankin (Stroke Patients Only)       Balance Overall balance assessment: Needs assistance Sitting-balance support: Feet supported, No upper extremity supported Sitting balance-Leahy Scale: Good     Standing balance support: During functional activity, Single extremity supported Standing balance-Leahy Scale: Fair                              Cognition Arousal/Alertness: Awake/alert Behavior During Therapy: WFL for tasks assessed/performed Overall Cognitive Status: Within Functional Limits for tasks assessed                                          Exercises Total Joint Exercises Ankle Circles/Pumps: AROM Quad Sets: AROM, Both, 10 reps, Supine Heel Slides: AAROM, Left, 15 reps, Supine Straight Leg Raises: AAROM, Left, Supine, 15 reps Goniometric ROM: -8 - 45 AAROML  knee    General Comments        Pertinent Vitals/Pain Pain Assessment Pain Assessment: 0-10 Pain Score: 6  Pain Location: left knee Pain Descriptors / Indicators: Operative site guarding, Discomfort Pain Intervention(s): Monitored during session, Limited activity within patient's tolerance, Premedicated before session    Home Living                          Prior Function            PT Goals (current goals can  now be found in the care plan section) Acute Rehab PT Goals Patient Stated Goal: Walk without pain PT Goal Formulation: With patient Time For Goal Achievement: 02/26/22 Potential to Achieve Goals: Good Progress towards PT goals: Not progressing toward goals - comment (fatigue)    Frequency    7X/week      PT Plan Current plan remains appropriate    Co-evaluation              AM-PAC PT "6 Clicks" Mobility   Outcome Measure  Help needed turning from your back to your side while in a flat bed without using bedrails?: None Help needed moving from lying on your back to sitting on the side of a flat bed without using bedrails?: A Little Help needed moving to and from a bed to a chair (including a wheelchair)?: A Little Help needed standing up from a chair using your arms (e.g., wheelchair or bedside chair)?: A Little Help needed to walk in hospital room?: A Little Help needed climbing 3-5 steps with a railing? : A Lot 6 Click Score: 18    End of Session Equipment Utilized During Treatment: Gait belt Activity Tolerance: Patient limited by fatigue Patient left: in chair;with call bell/phone within reach;with chair alarm set Nurse Communication: Mobility status PT Visit Diagnosis: Pain;Difficulty in walking, not elsewhere classified (R26.2) Pain - Right/Left: Left Pain - part of body: Knee     Time: 1975-8832 PT Time Calculation (min) (ACUTE ONLY): 38 min  Charges:  $Gait Training: 8-22 mins $Therapeutic Exercise: 8-22 mins $Therapeutic Activity: 8-22 mins                     Debe Coder PT Acute Rehabilitation Services Pager [phone] Office [phone]    Yarisbel Miranda 02/21/2022, 9:59 AM

## 2022-02-21 NOTE — Progress Notes (Signed)
Physical Therapy Treatment Patient Details Name: Maureen Swanson MRN: [id number] DOB: 03-25-1966 Today's Date: 02/21/2022   History of Present Illness Pt is a 56yo female presenting s/p L-TKA on 02/19/22. PMH: anxiety & depression, bipolar, CHF, DM with peripheral neuropathy, GERD, gout, HTN, HLD, hx of CVA 1995, hx of substance abuse,  OSA on CPAP    PT Comments    Pt reports feeling better and up to re-attempt stairs in hopes of dc home this date.  Pt ambulated limited distance into hall but at bottom of stairs unable to step up onto lowest step and stating "my legs are getting weak and requesting to sit.  Pt returned to room in chair with RN aware.  Pt hopeful for dc home tomorrow.   Recommendations for follow up therapy are one component of a multi-disciplinary discharge planning process, led by the attending physician.  Recommendations may be updated based on patient status, additional functional criteria and insurance authorization.  Follow Up Recommendations  Follow physician's recommendations for discharge plan and follow up therapies     Assistance Recommended at Discharge Intermittent Supervision/Assistance  Patient can return home with the following A little help with walking and/or transfers;A little help with bathing/dressing/bathroom;Assistance with cooking/housework;Assist for transportation;Help with stairs or ramp for entrance   Equipment Recommendations  Rolling walker (2 wheels);Other (comment)    Recommendations for Other Services       Precautions / Restrictions Precautions Precautions: Fall;Knee Precaution Comments: NO PILLOW UNDER KNEE Restrictions Weight Bearing Restrictions: No LLE Weight Bearing: Weight bearing as tolerated Other Position/Activity Restrictions: WBAT     Mobility  Bed Mobility Overal bed mobility: Needs Assistance Bed Mobility: Sit to Supine     Supine to sit: Supervision Sit to supine: Min assist   General bed mobility comments: Pt up  in chair and returned to same    Transfers Overall transfer level: Needs assistance Equipment used: Rolling walker (2 wheels) Transfers: Sit to/from Stand Sit to Stand: Min guard   Step pivot transfers: Min assist       General transfer comment: cues for LE management and use of UEs to self assist    Ambulation/Gait Ambulation/Gait assistance: Min assist, Min guard Gait Distance (Feet): 17 Feet Assistive device: Rolling walker (2 wheels) Gait Pattern/deviations: Step-to pattern, Decreased step length - right, Decreased step length - left, Shuffle, Trunk flexed Gait velocity: decr     General Gait Details: cues for sequence, stride length, posture, to increase extension bil knees and position from RW, 2 episodes of slight knee buckle - distance ltd by fatigue   Stairs Stairs: Yes Stairs assistance: Min assist, Mod assist Stair Management: One rail Left, Step to pattern, Forwards, With crutches Number of Stairs: 2 General stair comments: Stairs attempted but pt unable to step up onto first step   Wheelchair Mobility    Modified Rankin (Stroke Patients Only)       Balance Overall balance assessment: Needs assistance Sitting-balance support: Feet supported, No upper extremity supported Sitting balance-Leahy Scale: Good     Standing balance support: Bilateral upper extremity supported Standing balance-Leahy Scale: Poor                              Cognition Arousal/Alertness: Awake/alert Behavior During Therapy: WFL for tasks assessed/performed Overall Cognitive Status: Within Functional Limits for tasks assessed  Exercises Total Joint Exercises Ankle Circles/Pumps: AROM Quad Sets: AROM, Both, 10 reps, Supine Heel Slides: AAROM, Left, 15 reps, Supine Straight Leg Raises: AAROM, Left, Supine, 15 reps Goniometric ROM: -8 - 45 AAROML knee    General Comments        Pertinent  Vitals/Pain Pain Assessment Pain Assessment: 0-10 Pain Score: 6  Pain Location: left knee Pain Descriptors / Indicators: Operative site guarding, Discomfort Pain Intervention(s): Limited activity within patient's tolerance, Monitored during session, Premedicated before session, Ice applied    Home Living                          Prior Function            PT Goals (current goals can now be found in the care plan section) Acute Rehab PT Goals Patient Stated Goal: Walk without pain PT Goal Formulation: With patient Time For Goal Achievement: 02/26/22 Potential to Achieve Goals: Good Progress towards PT goals: Not progressing toward goals - comment (limited by fatigue)    Frequency    7X/week      PT Plan Current plan remains appropriate    Co-evaluation              AM-PAC PT "6 Clicks" Mobility   Outcome Measure  Help needed turning from your back to your side while in a flat bed without using bedrails?: None Help needed moving from lying on your back to sitting on the side of a flat bed without using bedrails?: A Little Help needed moving to and from a bed to a chair (including a wheelchair)?: A Little Help needed standing up from a chair using your arms (e.g., wheelchair or bedside chair)?: A Little Help needed to walk in hospital room?: A Little Help needed climbing 3-5 steps with a railing? : Total 6 Click Score: 17    End of Session Equipment Utilized During Treatment: Gait belt Activity Tolerance: Patient limited by fatigue Patient left: in chair;with call bell/phone within reach;with nursing/sitter in room Nurse Communication: Mobility status PT Visit Diagnosis: Pain;Difficulty in walking, not elsewhere classified (R26.2) Pain - Right/Left: Left Pain - part of body: Knee     Time: 7342-8768 PT Time Calculation (min) (ACUTE ONLY): 12 min  Charges:  $Gait Training: 8-22 mins $Therapeutic Activity: 8-22 mins                     Debe Coder PT Acute Rehabilitation Services Pager [phone] Office [phone]    Cleo Santucci 02/21/2022, 1:15 PM

## 2022-02-21 NOTE — Progress Notes (Signed)
Physical Therapy Treatment Patient Details Name: Maureen Swanson MRN: [id number] DOB: 1966-05-13 Today's Date: 02/21/2022   History of Present Illness Pt is a 56yo female presenting s/p L-TKA on 02/19/22. PMH: anxiety & depression, bipolar, CHF, DM with peripheral neuropathy, GERD, gout, HTN, HLD, hx of CVA 1995, hx of substance abuse,  OSA on CPAP    PT Comments    Pt continues very cooperative and up to attempt stairs.  Pt able to negotiate stairs but with significant assist and noted buckling bil knees causing safety concerns.  Will re-attempt this pm  Recommendations for follow up therapy are one component of a multi-disciplinary discharge planning process, led by the attending physician.  Recommendations may be updated based on patient status, additional functional criteria and insurance authorization.  Follow Up Recommendations  Follow physician's recommendations for discharge plan and follow up therapies     Assistance Recommended at Discharge Intermittent Supervision/Assistance  Patient can return home with the following A little help with walking and/or transfers;A little help with bathing/dressing/bathroom;Assistance with cooking/housework;Assist for transportation;Help with stairs or ramp for entrance   Equipment Recommendations  Rolling walker (2 wheels);Other (comment)    Recommendations for Other Services       Precautions / Restrictions Precautions Precautions: Fall;Knee Precaution Comments: NO PILLOW UNDER KNEE Restrictions Weight Bearing Restrictions: No LLE Weight Bearing: Weight bearing as tolerated     Mobility  Bed Mobility Overal bed mobility: Needs Assistance Bed Mobility: Sit to Supine     Supine to sit: Supervision Sit to supine: Min assist   General bed mobility comments: assist with LEs 2* pt fatigue    Transfers Overall transfer level: Needs assistance Equipment used: Rolling walker (2 wheels) Transfers: Sit to/from Stand, Bed to  chair/wheelchair/BSC Sit to Stand: Min guard, Supervision   Step pivot transfers: Min assist       General transfer comment: cues for LE management and use of UEs to self assist; step pvt recliner to bedside    Ambulation/Gait Ambulation/Gait assistance: Min guard Gait Distance (Feet): 10 Feet Assistive device: Rolling walker (2 wheels) Gait Pattern/deviations: Step-to pattern, Decreased step length - right, Decreased step length - left, Shuffle, Trunk flexed Gait velocity: decr     General Gait Details: cues for sequence, stride length, posture, to increase extension bil knees and position from RW, 2 episodes of slight knee buckle - distance ltd by fatigue   Stairs Stairs: Yes Stairs assistance: Min assist, Mod assist Stair Management: One rail Left, Step to pattern, Forwards, With crutches Number of Stairs: 2 General stair comments: Increased time, cues for sequence and crutch placement, noted buckling at bil knees   Wheelchair Mobility    Modified Rankin (Stroke Patients Only)       Balance Overall balance assessment: Needs assistance Sitting-balance support: Feet supported, No upper extremity supported Sitting balance-Leahy Scale: Good     Standing balance support: Bilateral upper extremity supported Standing balance-Leahy Scale: Poor                              Cognition Arousal/Alertness: Awake/alert Behavior During Therapy: WFL for tasks assessed/performed Overall Cognitive Status: Within Functional Limits for tasks assessed                                          Exercises Total Joint Exercises Ankle Circles/Pumps: AROM Quad  Sets: AROM, Both, 10 reps, Supine Heel Slides: AAROM, Left, 15 reps, Supine Straight Leg Raises: AAROM, Left, Supine, 15 reps Goniometric ROM: -8 - 45 AAROML knee    General Comments        Pertinent Vitals/Pain Pain Assessment Pain Assessment: 0-10 Pain Score: 6  Pain Location: left  knee Pain Descriptors / Indicators: Operative site guarding, Discomfort Pain Intervention(s): Premedicated before session, Monitored during session, Limited activity within patient's tolerance, Ice applied    Home Living                          Prior Function            PT Goals (current goals can now be found in the care plan section) Acute Rehab PT Goals Patient Stated Goal: Walk without pain PT Goal Formulation: With patient Time For Goal Achievement: 02/26/22 Potential to Achieve Goals: Good Progress towards PT goals: Progressing toward goals    Frequency    7X/week      PT Plan Current plan remains appropriate    Co-evaluation              AM-PAC PT "6 Clicks" Mobility   Outcome Measure  Help needed turning from your back to your side while in a flat bed without using bedrails?: None Help needed moving from lying on your back to sitting on the side of a flat bed without using bedrails?: A Little Help needed moving to and from a bed to a chair (including a wheelchair)?: A Little Help needed standing up from a chair using your arms (e.g., wheelchair or bedside chair)?: A Little Help needed to walk in hospital room?: A Little Help needed climbing 3-5 steps with a railing? : A Lot 6 Click Score: 18    End of Session Equipment Utilized During Treatment: Gait belt Activity Tolerance: Patient limited by fatigue Patient left: in bed Nurse Communication: Mobility status PT Visit Diagnosis: Pain;Difficulty in walking, not elsewhere classified (R26.2) Pain - Right/Left: Left Pain - part of body: Knee     Time: 3220-2542 PT Time Calculation (min) (ACUTE ONLY): 21 min  Charges:  $Gait Training: 8-22 mins $Therapeutic Exercise: 8-22 mins $Therapeutic Activity: 8-22 mins                     Debe Coder PT Acute Rehabilitation Services Pager [phone] Office [phone]    Maureen Swanson 02/21/2022, 12:22 PM

## 2022-02-21 NOTE — Progress Notes (Signed)
    Subjective:  Patient reports pain as mild to moderate.  Denies N/V/CP/SOB/Abd pain. She denies tingling and numbness in LE bilaterally.  Nurse states overnight increase in pain requiring oxycodone 10mg  and dilaudid 1mg . Patient reports that her pain is well controlled this morning.   Objective:    VITALS:   Vitals:   02/20/22 1821 02/20/22 2049 02/20/22 2137 02/21/22 0537  BP: (!) 175/99  (!) 148/102 (!) 145/88  Pulse: 64 68 67 78  Resp: 17 16 17 17   Temp: 98.5 F (36.9 C)  97.9 F (36.6 C) 98.2 F (36.8 C)  TempSrc: Oral  Oral Oral  SpO2: 95% 93% 100% 96%  Weight:      Height:        Patient sleeping upon entering room, patient woken up for exam. NAD.  Neurologically intact ABD soft Neurovascular intact Sensation intact distally Intact pulses distally Dorsiflexion/Plantar flexion intact Incision: dressing C/D/I No cellulitis present Compartment soft   Lab Results  Component Value Date   WBC 9.2 02/21/2022   HGB 11.3 (L) 02/21/2022   HCT 36.8 02/21/2022   MCV 94.6 02/21/2022   PLT 146 (L) 02/21/2022   BMET    Component Value Date/Time   NA 137 02/20/2022 0421   NA 140 12/13/2021 1154   K 4.3 02/20/2022 0421   CL 105 02/20/2022 0421   CO2 24 02/20/2022 0421   GLUCOSE 238 (H) 02/20/2022 0421   BUN 16 02/20/2022 0421   BUN 23 12/13/2021 1154   CREATININE 0.97 02/20/2022 0421   CALCIUM 9.1 02/20/2022 0421   EGFR 48 (L) 12/13/2021 1154   GFRNONAA >60 02/20/2022 0421     Assessment/Plan: 2 Days Post-Op   Principal Problem:   Osteoarthritis of left knee  Patient's BP was elevated in 1801/96 and 183/98 yesterday. Restarted home meds of losartan and HCTZ. PT improved to 145/88 today.   WBAT with walker DVT ppx: Aspirin, SCDs, TEDS PO pain control PT/OT: Patient ambulated well with PT yesterday, stair training today. Continue PT  Dispo: D/c today after cleared with PT.   Charlott Rakes, PA-C 02/21/2022, 7:29 AM   [facility]  [facility]  [facility] [address], [place], [place] [address]

## 2022-02-22 DIAGNOSIS — M1712 Unilateral primary osteoarthritis, left knee: Secondary | ICD-10-CM | POA: Diagnosis not present

## 2022-02-22 LAB — GLUCOSE, CAPILLARY: Glucose-Capillary: 200 mg/dL — ABNORMAL HIGH (ref 70–99)

## 2022-02-22 NOTE — Progress Notes (Signed)
Physical Therapy Treatment Patient Details Name: Maureen Swanson MRN: [id number] DOB: 14-Mar-1966 Today's Date: 02/22/2022   History of Present Illness Pt is a 56yo female presenting s/p L-TKA on 02/19/22. PMH: anxiety & depression, bipolar, CHF, DM with peripheral neuropathy, GERD, gout, HTN, HLD, hx of CVA 1995, hx of substance abuse,  OSA on CPAP    PT Comments    Pt with marked improvement in activity tolerance and stability with ambulation this am.  Pt performed therex program, ambulated in hall and negotiated stairs with min assist.  Pt eager for dc home this date.   Recommendations for follow up therapy are one component of a multi-disciplinary discharge planning process, led by the attending physician.  Recommendations may be updated based on patient status, additional functional criteria and insurance authorization.  Follow Up Recommendations  Follow physician's recommendations for discharge plan and follow up therapies     Assistance Recommended at Discharge Intermittent Supervision/Assistance  Patient can return home with the following A little help with walking and/or transfers;A little help with bathing/dressing/bathroom;Assistance with cooking/housework;Assist for transportation;Help with stairs or ramp for entrance   Equipment Recommendations  Rolling walker (2 wheels);Other (comment)    Recommendations for Other Services       Precautions / Restrictions Precautions Precautions: Fall;Knee Precaution Comments: NO PILLOW UNDER KNEE Restrictions Weight Bearing Restrictions: No LLE Weight Bearing: Weight bearing as tolerated Other Position/Activity Restrictions: WBAT     Mobility  Bed Mobility               General bed mobility comments: Pt up in chair and returned to same    Transfers Overall transfer level: Needs assistance Equipment used: Rolling walker (2 wheels) Transfers: Sit to/from Stand Sit to Stand: Supervision           General transfer  comment: cues for LE management and use of UEs to self assist    Ambulation/Gait Ambulation/Gait assistance: Min guard, Supervision Gait Distance (Feet): 60 Feet Assistive device: Rolling walker (2 wheels) Gait Pattern/deviations: Step-to pattern, Decreased step length - right, Decreased step length - left, Shuffle, Trunk flexed Gait velocity: decr     General Gait Details: cues for sequence, stride length, posture, to increase extension bil knees and position from RW, 2 episodes of slight knee buckle - distance ltd by fatigue   Stairs Stairs: Yes Stairs assistance: Min assist Stair Management: One rail Left, Step to pattern, Forwards, With crutches Number of Stairs: 2 General stair comments: cues for sequence and foot/crutch placement - written instruction provided   Wheelchair Mobility    Modified Rankin (Stroke Patients Only)       Balance Overall balance assessment: Needs assistance Sitting-balance support: Feet supported, No upper extremity supported Sitting balance-Leahy Scale: Good     Standing balance support: Single extremity supported Standing balance-Leahy Scale: Poor                              Cognition Arousal/Alertness: Awake/alert Behavior During Therapy: WFL for tasks assessed/performed Overall Cognitive Status: Within Functional Limits for tasks assessed                                          Exercises Total Joint Exercises Ankle Circles/Pumps: AROM Quad Sets: AROM, Both, 10 reps, Supine Heel Slides: AAROM, Left, 15 reps, Supine Hip ABduction/ADduction: AAROM, Left, 10 reps,  Supine Straight Leg Raises: AAROM, Left, Supine, 15 reps Long Arc Quad: AAROM, AROM, Left, 10 reps, Seated Goniometric ROM: -8 - 50 AAROM at knee    General Comments        Pertinent Vitals/Pain Pain Assessment Pain Assessment: 0-10 Pain Score: 5  Pain Location: left knee Pain Descriptors / Indicators: Operative site guarding,  Discomfort Pain Intervention(s): Limited activity within patient's tolerance, Monitored during session, Premedicated before session, Ice applied    Home Living                          Prior Function            PT Goals (current goals can now be found in the care plan section) Acute Rehab PT Goals Patient Stated Goal: Walk without pain PT Goal Formulation: With patient Time For Goal Achievement: 02/26/22 Potential to Achieve Goals: Good Progress towards PT goals: Progressing toward goals    Frequency    7X/week      PT Plan Current plan remains appropriate    Co-evaluation              AM-PAC PT "6 Clicks" Mobility   Outcome Measure  Help needed turning from your back to your side while in a flat bed without using bedrails?: None Help needed moving from lying on your back to sitting on the side of a flat bed without using bedrails?: A Little Help needed moving to and from a bed to a chair (including a wheelchair)?: A Little Help needed standing up from a chair using your arms (e.g., wheelchair or bedside chair)?: A Little Help needed to walk in hospital room?: A Little Help needed climbing 3-5 steps with a railing? : A Little 6 Click Score: 19    End of Session Equipment Utilized During Treatment: Gait belt Activity Tolerance: Patient tolerated treatment well Patient left: in chair;with call bell/phone within reach Nurse Communication: Mobility status PT Visit Diagnosis: Pain;Difficulty in walking, not elsewhere classified (R26.2) Pain - Right/Left: Left Pain - part of body: Knee     Time: 0830-0901 PT Time Calculation (min) (ACUTE ONLY): 31 min  Charges:  $Gait Training: 8-22 mins $Therapeutic Exercise: 8-22 mins                     [place] Pager [phone] Office [phone]    Marlyce Mcdougald 02/22/2022, 9:45 AM

## 2022-02-22 NOTE — Discharge Summary (Signed)
Patient ID: Maureen Swanson MRN: [id number] DOB/AGE: Sep 05, 1965 56 y.o.  Admit date: 02/19/2022 Discharge date: 02/22/2022  Primary Diagnosis: Left knee osteoarthritis Admission Diagnoses: Status post left total knee arthroplasty Past Medical History:  Diagnosis Date   Anxiety    Arthritis    back and knees   Asthma    daily and prn inhalers   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 x 12 yrs.   Sleep apnea    Stroke ([place])    1995   Mild. no residual problems   Substance abuse ([place])    crack cocaine:  extensive treatment with [place] [facility] from 2014 to 2018 for this and alcohol abuse.   TMJ (temporomandibular joint disorder)    Discharge Diagnoses:   Principal Problem:   Osteoarthritis of left knee  Estimated body mass index is 40.85 kg/m as calculated from the following:   Height as of this encounter: 5' 4" (1.626 m).   Weight as of this encounter: 108 kg.  Procedure:  Procedure(s) (LRB): COMPUTER ASSISTED TOTAL KNEE ARTHROPLASTY (Left)   Consults: None  HPI: Maureen Swanson is a 56 year old female with a past medical history of CHF, schizoaffective disorder, diabetes who presented to the operating room for elective left total knee arthroplasty.  She was seen in the office at [facility] where she underwent conservative treatment and after failure of conservative treatment was indicated for a left total knee arthroplasty.  She underwent surgery on 02/19/2022 and was admitted for postoperative monitoring, therapy, pain control.  Laboratory Data: Admission on 02/19/2022, Discharged on 02/22/2022  Component Date Value Ref Range Status    Glucose-Capillary 02/19/2022 171 (H)  70 - 99 mg/dL Final   Glucose reference range applies only to samples taken after fasting for at least 8 hours.   Glucose-Capillary 02/19/2022 165 (H)  70 - 99 mg/dL Final   Glucose reference range applies only to samples taken after fasting for at least 8 hours.   WBC 02/20/2022 9.0  4.0 - 10.5 K/uL Final   RBC 02/20/2022 3.49 (L)  3.87 - 5.11 MIL/uL Final   Hemoglobin 02/20/2022 10.2 (L)  12.0 - 15.0 g/dL Final   HCT 02/20/2022 33.4 (L)  36.0 - 46.0 % Final   MCV 02/20/2022 95.7  80.0 - 100.0 fL Final   MCH 02/20/2022 29.2  26.0 - 34.0 pg Final   MCHC 02/20/2022 30.5  30.0 - 36.0 g/dL Final   RDW 02/20/2022 11.9  11.5 - 15.5 % Final   Platelets 02/20/2022 205  150 - 400 K/uL Final   nRBC 02/20/2022 0.0  0.0 - 0.2 % Final   Performed at [facility], [place] [address], [place], [place] [address]   Sodium 02/20/2022 137  135 - 145 mmol/L Final   Potassium 02/20/2022 4.3  3.5 - 5.1 mmol/L Final   Chloride 02/20/2022 105  98 - 111 mmol/L Final   CO2 02/20/2022 24  22 - 32 mmol/L Final   Glucose, Bld 02/20/2022 238 (H)  70 - 99 mg/dL Final   Glucose reference range applies  only to samples taken after fasting for at least 8 hours.   BUN 02/20/2022 16  6 - 20 mg/dL Final   Creatinine, Ser 02/20/2022 0.97  0.44 - 1.00 mg/dL Final   Calcium 02/20/2022 9.1  8.9 - 10.3 mg/dL Final   GFR, Estimated 02/20/2022 >60  >60 mL/min Final   Comment: (NOTE) Calculated using the CKD-EPI Creatinine Equation (2021)    Anion gap 02/20/2022 8  5 - 15 Final   Performed at [facility], [place] [address] [place], [place] [address]   Glucose-Capillary 02/19/2022 241 (H)  70 - 99 mg/dL Final   Glucose reference range applies only to samples taken after fasting for at least 8 hours.   Glucose-Capillary 02/19/2022 217 (H)  70 - 99 mg/dL Final   Glucose reference range applies only to samples taken after fasting for at least 8 hours.    Glucose-Capillary 02/20/2022 186 (H)  70 - 99 mg/dL Final   Glucose reference range applies only to samples taken after fasting for at least 8 hours.   Glucose-Capillary 02/20/2022 147 (H)  70 - 99 mg/dL Final   Glucose reference range applies only to samples taken after fasting for at least 8 hours.   WBC 02/21/2022 9.2  4.0 - 10.5 K/uL Final   RBC 02/21/2022 3.89  3.87 - 5.11 MIL/uL Final   Hemoglobin 02/21/2022 11.3 (L)  12.0 - 15.0 g/dL Final   HCT 02/21/2022 36.8  36.0 - 46.0 % Final   MCV 02/21/2022 94.6  80.0 - 100.0 fL Final   MCH 02/21/2022 29.0  26.0 - 34.0 pg Final   MCHC 02/21/2022 30.7  30.0 - 36.0 g/dL Final   RDW 02/21/2022 11.9  11.5 - 15.5 % Final   Platelets 02/21/2022 146 (L)  150 - 400 K/uL Final   Comment: SPECIMEN CHECKED FOR CLOTS REPEATED TO VERIFY    nRBC 02/21/2022 0.0  0.0 - 0.2 % Final   Performed at [facility], [place] [address], [place], [place] [address]   Glucose-Capillary 02/20/2022 216 (H)  70 - 99 mg/dL Final   Glucose reference range applies only to samples taken after fasting for at least 8 hours.   Glucose-Capillary 02/20/2022 220 (H)  70 - 99 mg/dL Final   Glucose reference range applies only to samples taken after fasting for at least 8 hours.   Glucose-Capillary 02/21/2022 205 (H)  70 - 99 mg/dL Final   Glucose reference range applies only to samples taken after fasting for at least 8 hours.   Glucose-Capillary 02/21/2022 179 (H)  70 - 99 mg/dL Final   Glucose reference range applies only to samples taken after fasting for at least 8 hours.   Glucose-Capillary 02/21/2022 160 (H)  70 - 99 mg/dL Final   Glucose reference range applies only to samples taken after fasting for at least 8 hours.   Glucose-Capillary 02/21/2022 194 (H)  70 - 99 mg/dL Final   Glucose reference range applies only to samples taken after fasting for at least 8 hours.   Glucose-Capillary 02/22/2022 200 (H)  70 - 99 mg/dL Final   Glucose reference range  applies only to samples taken after fasting for at least 8 hours.  Hospital Outpatient Visit on 02/11/2022  Component Date Value Ref Range Status   MRSA, PCR 02/11/2022 NEGATIVE  NEGATIVE Final   Staphylococcus aureus 02/11/2022 NEGATIVE  NEGATIVE Final   Comment: (NOTE) The Xpert SA Assay (FDA approved for NASAL specimens in patients 22 years  of age and older), is one component of a comprehensive surveillance program. It is not intended to diagnose infection nor to guide or monitor treatment. Performed at [facility], [place] [address] [place], [place] [address]    Hgb A1c MFr Bld 02/11/2022 6.3 (H)  4.8 - 5.6 % Final   Comment: (NOTE) Pre diabetes:          5.7%-6.4%  Diabetes:              >6.4%  Glycemic control for   <7.0% adults with diabetes    Mean Plasma Glucose 02/11/2022 134.11  mg/dL Final   Performed at [facility], [place] [address] [place], [place] [address]   Sodium 02/11/2022 138  135 - 145 mmol/L Final   Potassium 02/11/2022 3.8  3.5 - 5.1 mmol/L Final   Chloride 02/11/2022 107  98 - 111 mmol/L Final   CO2 02/11/2022 24  22 - 32 mmol/L Final   Glucose, Bld 02/11/2022 114 (H)  70 - 99 mg/dL Final   Glucose reference range applies only to samples taken after fasting for at least 8 hours.   BUN 02/11/2022 15  6 - 20 mg/dL Final   Creatinine, Ser 02/11/2022 1.03 (H)  0.44 - 1.00 mg/dL Final   Calcium 02/11/2022 9.4  8.9 - 10.3 mg/dL Final   Total Protein 02/11/2022 7.3  6.5 - 8.1 g/dL Final   Albumin 02/11/2022 4.0  3.5 - 5.0 g/dL Final   AST 02/11/2022 25  15 - 41 U/L Final   ALT 02/11/2022 22  0 - 44 U/L Final   Alkaline Phosphatase 02/11/2022 46  38 - 126 U/L Final   Total Bilirubin 02/11/2022 0.6  0.3 - 1.2 mg/dL Final   GFR, Estimated 02/11/2022 >60  >60 mL/min Final   Comment: (NOTE) Calculated using the CKD-EPI Creatinine Equation (2021)    Anion gap 02/11/2022 7  5 - 15 Final   Performed at [facility], [place] [address] [place], [place] [address]   WBC 02/11/2022 5.0  4.0 - 10.5 K/uL Final   RBC 02/11/2022 4.05  3.87 - 5.11 MIL/uL Final   Hemoglobin 02/11/2022 11.8 (L)  12.0 - 15.0 g/dL Final   HCT 02/11/2022 37.9  36.0 - 46.0 % Final   MCV 02/11/2022 93.6  80.0 - 100.0 fL Final   MCH 02/11/2022 29.1  26.0 - 34.0 pg Final   MCHC 02/11/2022 31.1  30.0 - 36.0 g/dL Final   RDW 02/11/2022 12.1  11.5 - 15.5 % Final   Platelets 02/11/2022 236  150 - 400 K/uL Final   nRBC 02/11/2022 0.0  0.0 - 0.2 % Final   Performed at [facility], [place] [address], [place], [place] [address]   Glucose-Capillary 02/11/2022 132 (H)  70 - 99 mg/dL Final   Glucose reference range applies only to samples taken after fasting for at least 8 hours.  Appointment on 01/01/2022  Component Date Value Ref Range Status   Area-P 1/2 01/01/2022 3.56  cm2 Final   S' Lateral 01/01/2022 2.90  cm Final  Office Visit on 12/31/2021  Component Date Value Ref Range Status   Hep C Virus Ab 12/31/2021 Non Reactive  Non Reactive Final   Comment: HCV antibody alone does not differentiate between previously resolved infection and active infection. Equivocal and Reactive HCV antibody results should be followed up with an HCV RNA test to support the diagnosis of active HCV infection.  Chlamydia trachomatis, NAA 12/31/2021 Negative  Negative Final   Neisseria Gonorrhoeae by PCR 12/31/2021 Negative  Negative Final   HIV Screen 4th Generation wRfx 12/31/2021 Non Reactive  Non Reactive Final   Comment: HIV Negative HIV-1/HIV-2 antibodies and HIV-1 p24 antigen were NOT detected. There is no laboratory evidence of HIV infection.    TSH 12/31/2021 0.909  0.450 - 4.500 uIU/mL Final   Trichomonas, UA 12/31/2021 Positive   Final   strongly + whiff   Clue Cells Wet Prep HPF POC 12/31/2021 negative   Final   Epithelial Wet Prep HPF POC 12/31/2021 Many  Few, Moderate, Many, Too numerous to count Final   Yeast Wet Prep HPF POC  12/31/2021 negative   Final   Bacteria Wet Prep HPF POC 12/31/2021 Many (A)  Few Final   RBC Wet Prep HPF POC 12/31/2021 negative   Final   WBC Wet Prep HPF POC 12/31/2021 many   Final   KOH Prep POC 12/31/2021 Negative  Negative Final     X-Rays:DG Knee Left Port  Result Date: 02/19/2022 CLINICAL DATA:  Left total knee replacement, postoperative assessment EXAM: PORTABLE LEFT KNEE - 1-2 VIEW COMPARISON:  08/23/2021 FINDINGS: A left total knee prosthesis is in place. Grade 2 notching of the anterior femoral cortex just proximal to the anterior flange of the femoral component of the prosthesis observed on the lateral projection. No current supracondylar/periprosthetic fracture identified. Expected gas noted in the soft tissues. IMPRESSION: 1. Grade 2 notching of the anterior femoral cortex just proximal to the anterior flange of the femoral component of the prosthesis. No current periprosthetic fracture. Electronically Signed   By: Van Clines M.D.   On: 02/19/2022 15:27    EKG: Orders placed or performed in visit on 01/15/22   EKG 12-Lead     Hospital Course: ADDELYN ALLEMAN is a 56 y.o. who was admitted to Hospital. They were brought to the operating room on 02/19/2022 and underwent Procedure(s): [place].  Patient tolerated the procedure well and was later transferred to the recovery room and then to the orthopaedic floor for postoperative care.  They were given PO and IV analgesics for pain control following their surgery.  They were given 24 hours of postoperative antibiotics of  Anti-infectives (From admission, onward)    Start     Dose/Rate Route Frequency Ordered Stop   02/19/22 1800  ceFAZolin (ANCEF) IVPB 2g/100 mL premix        2 g 200 mL/hr over 30 Minutes Intravenous Every 6 hours 02/19/22 1624 02/20/22 0835   02/19/22 0845  ceFAZolin (ANCEF) IVPB 2g/100 mL premix        2 g 200 mL/hr over 30 Minutes Intravenous On call to O.R. 02/19/22 0630  02/19/22 1211      and started on DVT prophylaxis in the form of Aspirin.   PT and OT were ordered for total joint protocol.  Discharge planning consulted to help with postop disposition and equipment needs.  Patient had a uneventful night on the evening of surgery.  They started to get up OOB with therapy on day one.   Continued to work with therapy into day two.  By day three, the patient had progressed with therapy and meeting their goals.  Patient was seen in rounds and was ready to go home.   Diet: Regular diet Activity:WBAT Follow-up:in 2 weeks Disposition - Home Discharged Condition: good   Discharge Instructions     Call MD / Call 911  Complete by: As directed    If you experience chest pain or shortness of breath, CALL 911 and be transported to the hospital emergency room.  If you develope a fever above 101 F, pus (white drainage) or increased drainage or redness at the wound, or calf pain, call your surgeon's office.   Call MD / Call 911   Complete by: As directed    If you experience chest pain or shortness of breath, CALL 911 and be transported to the hospital emergency room.  If you develope a fever above 101 F, pus (white drainage) or increased drainage or redness at the wound, or calf pain, call your surgeon's office.   Call MD / Call 911   Complete by: As directed    If you experience chest pain or shortness of breath, CALL 911 and be transported to the hospital emergency room.  If you develope a fever above 101 F, pus (white drainage) or increased drainage or redness at the wound, or calf pain, call your surgeon's office.   Change dressing   Complete by: As directed    Do not remove your dressing.   Change dressing   Complete by: As directed    Do not remove your dressing.   Constipation Prevention   Complete by: As directed    Drink plenty of fluids.  Prune juice may be helpful.  You may use a stool softener, such as Colace (over the counter) 100 mg twice a day.   Use MiraLax (over the counter) for constipation as needed.   Constipation Prevention   Complete by: As directed    Drink plenty of fluids.  Prune juice may be helpful.  You may use a stool softener, such as Colace (over the counter) 100 mg twice a day.  Use MiraLax (over the counter) for constipation as needed.   Constipation Prevention   Complete by: As directed    Drink plenty of fluids.  Prune juice may be helpful.  You may use a stool softener, such as Colace (over the counter) 100 mg twice a day.  Use MiraLax (over the counter) for constipation as needed.   Diet - low sodium heart healthy   Complete by: As directed    Diet - low sodium heart healthy   Complete by: As directed    Discharge instructions   Complete by: As directed    Elevate toes above nose. Use cryotherapy as needed for pain and swelling.   Discharge instructions   Complete by: As directed    Elevate toes above nose. Use cryotherapy as needed for pain and swelling.   Do not put a pillow under the knee. Place it under the heel.   Complete by: As directed    Do not put a pillow under the knee. Place it under the heel.   Complete by: As directed    Driving restrictions   Complete by: As directed    No driving for 6 weeks   Driving restrictions   Complete by: As directed    No driving for 6 weeks   Increase activity slowly as tolerated   Complete by: As directed    Increase activity slowly as tolerated   Complete by: As directed    Increase activity slowly as tolerated   Complete by: As directed    Lifting restrictions   Complete by: As directed    No lifting for 6 weeks   Lifting restrictions   Complete by: As directed  No lifting for 6 weeks   Post-operative opioid taper instructions:   Complete by: As directed    POST-OPERATIVE OPIOID TAPER INSTRUCTIONS: It is important to wean off of your opioid medication as soon as possible. If you do not need pain medication after your surgery it is ok to stop day  one. Opioids include: Codeine, Hydrocodone(Norco, Vicodin), Oxycodone(Percocet, oxycontin) and hydromorphone amongst others.  Long term and even short term use of opiods can cause: Increased pain response Dependence Constipation Depression Respiratory depression And more.  Withdrawal symptoms can include Flu like symptoms Nausea, vomiting And more Techniques to manage these symptoms Hydrate well Eat regular healthy meals Stay active Use relaxation techniques(deep breathing, meditating, yoga) Do Not substitute Alcohol to help with tapering If you have been on opioids for less than two weeks and do not have pain than it is ok to stop all together.  Plan to wean off of opioids This plan should start within one week post op of your joint replacement. Maintain the same interval or time between taking each dose and first decrease the dose.  Cut the total daily intake of opioids by one tablet each day Next start to increase the time between doses. The last dose that should be eliminated is the evening dose.      Post-operative opioid taper instructions:   Complete by: As directed    POST-OPERATIVE OPIOID TAPER INSTRUCTIONS: It is important to wean off of your opioid medication as soon as possible. If you do not need pain medication after your surgery it is ok to stop day one. Opioids include: Codeine, Hydrocodone(Norco, Vicodin), Oxycodone(Percocet, oxycontin) and hydromorphone amongst others.  Long term and even short term use of opiods can cause: Increased pain response Dependence Constipation Depression Respiratory depression And more.  Withdrawal symptoms can include Flu like symptoms Nausea, vomiting And more Techniques to manage these symptoms Hydrate well Eat regular healthy meals Stay active Use relaxation techniques(deep breathing, meditating, yoga) Do Not substitute Alcohol to help with tapering If you have been on opioids for less than two weeks and do not have  pain than it is ok to stop all together.  Plan to wean off of opioids This plan should start within one week post op of your joint replacement. Maintain the same interval or time between taking each dose and first decrease the dose.  Cut the total daily intake of opioids by one tablet each day Next start to increase the time between doses. The last dose that should be eliminated is the evening dose.      Post-operative opioid taper instructions:   Complete by: As directed    POST-OPERATIVE OPIOID TAPER INSTRUCTIONS: It is important to wean off of your opioid medication as soon as possible. If you do not need pain medication after your surgery it is ok to stop day one. Opioids include: Codeine, Hydrocodone(Norco, Vicodin), Oxycodone(Percocet, oxycontin) and hydromorphone amongst others.  Long term and even short term use of opiods can cause: Increased pain response Dependence Constipation Depression Respiratory depression And more.  Withdrawal symptoms can include Flu like symptoms Nausea, vomiting And more Techniques to manage these symptoms Hydrate well Eat regular healthy meals Stay active Use relaxation techniques(deep breathing, meditating, yoga) Do Not substitute Alcohol to help with tapering If you have been on opioids for less than two weeks and do not have pain than it is ok to stop all together.  Plan to wean off of opioids This plan should start within one week post  op of your joint replacement. Maintain the same interval or time between taking each dose and first decrease the dose.  Cut the total daily intake of opioids by one tablet each day Next start to increase the time between doses. The last dose that should be eliminated is the evening dose.      TED hose   Complete by: As directed    Use stockings (TED hose) for 2 weeks on both leg(s).  You may remove them at night for sleeping.   TED hose   Complete by: As directed    Use stockings (TED hose) for 2  weeks on both leg(s).  You may remove them at night for sleeping.   Weight bearing as tolerated   Complete by: As directed    Weight bearing as tolerated   Complete by: As directed       Allergies as of 02/22/2022       Reactions   Chocolate Hives   Orange Hives, Other (See Comments)   "Acid foods"   Penicillins Hives, Other (See Comments)   Has patient had a PCN reaction causing immediate rash, facial/tongue/throat swelling, SOB or lightheadedness with hypotension: Yes Has patient had a PCN reaction causing severe rash involving mucus membranes or skin necrosis: Yes Has patient had a PCN reaction that required hospitalization No Has patient had a PCN reaction occurring within the last 10 years: No If all of the above answers are "NO", then may proceed with Cephalosporin use.   Strawberry Extract Swelling   Tomato Hives, Other (See Comments)   "acid foods"        Medication List     STOP taking these medications    celecoxib 200 MG capsule Commonly known as: CELEBREX   ibuprofen 800 MG tablet Commonly known as: ADVIL   metroNIDAZOLE 500 MG tablet Commonly known as: FLAGYL       TAKE these medications    Abilify Maintena 400 MG Prsy prefilled syringe Generic drug: ARIPiprazole ER Inject 400 mg into the muscle every 30 (thirty) days. Last injection given on 01/10/2021. Next injection due 02/07/2021   Accu-Chek Aviva Plus test strip Generic drug: glucose blood Check blood glucose twice daily before meals   Accu-Chek Aviva Plus w/Device Kit Check blood glucose twice daily before meals   Accu-Chek Softclix Lancets lancets Check blood glucose twice daily before meals   albuterol 108 (90 Base) MCG/ACT inhaler Commonly known as: VENTOLIN HFA Inhale 2 puffs into the lungs every 6 (six) hours as needed for wheezing or shortness of breath.   amLODipine 10 MG tablet Commonly known as: NORVASC 1 tab by mouth daily   aspirin 81 MG chewable tablet Commonly known as:  Aspirin Childrens Chew 1 tablet (81 mg total) by mouth 2 (two) times daily with a meal.   atenolol 100 MG tablet Commonly known as: TENORMIN Take 1 tablet (100 mg total) by mouth daily.   atorvastatin 40 MG tablet Commonly known as: LIPITOR Take 1 tablet (40 mg total) by mouth at bedtime.   benztropine 1 MG tablet Commonly known as: COGENTIN Take 1 mg by mouth at bedtime.   Blood Pressure Kit Devi Check blood pressure 3 times weekly   cetirizine 10 MG tablet Commonly known as: ZYRTEC Take 1 tablet (10 mg total) by mouth daily as needed for allergies. What changed: when to take this   cyclobenzaprine 5 MG tablet Commonly known as: FLEXERIL Take 5 mg by mouth 3 (three) times daily as needed for  muscle spasms.   diclofenac Sodium 1 % Gel Commonly known as: VOLTAREN Apply 2g to knees up to 4 times daily as needed for pain   docusate sodium 100 MG capsule Commonly known as: Colace Take 1 capsule (100 mg total) by mouth 2 (two) times daily.   fluticasone-salmeterol 100-50 MCG/ACT Aepb Commonly known as: ADVAIR Inhale 1 puff into the lungs 2 (two) times daily. What changed:  when to take this reasons to take this   gabapentin 300 MG capsule Commonly known as: NEURONTIN Take 1 capsule (300 mg total) by mouth 3 (three) times daily.   hydrochlorothiazide 25 MG tablet Commonly known as: HYDRODIURIL 1 tab by mouth with morning meal and Losartan daily   lamoTRIgine 100 MG tablet Commonly known as: LAMICTAL Take 100 mg by mouth daily.   losartan 100 MG tablet Commonly known as: COZAAR 1 tab by mouth with HCTZ in morning daily.   melatonin 3 MG Tabs tablet Take 1 tablet (3 mg total) by mouth at bedtime.   meloxicam 15 MG tablet Commonly known as: MOBIC Take 1 tablet (15 mg total) by mouth daily with breakfast.   metFORMIN 1000 MG tablet Commonly known as: GLUCOPHAGE 1 tab by mouth twice daily with meals   omeprazole 40 MG capsule Commonly known as: PRILOSEC 1  cap by mouth in morning 1/2 hour before breakfast and other meds   ondansetron 4 MG tablet Commonly known as: Zofran Take 1 tablet (4 mg total) by mouth every 8 (eight) hours as needed for nausea or vomiting.   oxyCODONE 5 MG immediate release tablet Commonly known as: Roxicodone Take 1 tablet (5 mg total) by mouth every 4 (four) hours as needed for up to 7 days for severe pain or moderate pain.   polyethylene glycol 17 g packet Commonly known as: MiraLax Take 17 g by mouth daily as needed for mild constipation or moderate constipation.   prazosin 1 MG capsule Commonly known as: MINIPRESS Take 1 mg by mouth at bedtime.   senna 8.6 MG Tabs tablet Commonly known as: SENOKOT Take 2 tablets (17.2 mg total) by mouth at bedtime for 15 days.   traZODone 100 MG tablet Commonly known as: DESYREL Take 400 mg by mouth at bedtime.               Discharge Care Instructions  (From admission, onward)           Start     Ordered   02/21/22 0000  Weight bearing as tolerated        02/21/22 0734   02/21/22 0000  Change dressing       Comments: Do not remove your dressing.   02/21/22 0734   02/20/22 0000  Weight bearing as tolerated        02/20/22 0746   02/20/22 0000  Change dressing       Comments: Do not remove your dressing.   02/20/22 0746            Follow-up Information     Swinteck, Aaron Edelman, MD Follow up in 2 week(s).   Specialty: Orthopedic Surgery Why: For wound re-check Contact information: [address] [place] [address] [phone]                 Signed: Geralynn Rile, MD Orthopaedic Surgery 02/22/2022, 3:52 PM

## 2022-02-22 NOTE — Plan of Care (Signed)
  Problem: Pain Management: Goal: Pain level will decrease with appropriate interventions Outcome: Progressing   

## 2022-02-26 ENCOUNTER — Other Ambulatory Visit: Payer: Self-pay

## 2022-02-26 MED ORDER — AMLODIPINE BESYLATE 10 MG PO TABS: 10.0000 mg | ORAL_TABLET | Freq: Every day | ORAL | 9 refills | Status: DC

## 2022-03-13 ENCOUNTER — Encounter: Payer: Self-pay | Admitting: Internal Medicine

## 2022-03-13 ENCOUNTER — Ambulatory Visit (INDEPENDENT_AMBULATORY_CARE_PROVIDER_SITE_OTHER): Payer: Medicaid Other | Admitting: Internal Medicine

## 2022-03-13 VITALS — BP 172/110 | HR 100 | Resp 12 | Ht 64.0 in | Wt 241.0 lb

## 2022-03-13 DIAGNOSIS — M25462 Effusion, left knee: Secondary | ICD-10-CM

## 2022-03-13 DIAGNOSIS — K120 Recurrent oral aphthae: Secondary | ICD-10-CM

## 2022-03-13 DIAGNOSIS — K649 Unspecified hemorrhoids: Secondary | ICD-10-CM

## 2022-03-13 DIAGNOSIS — Z23 Encounter for immunization: Secondary | ICD-10-CM | POA: Diagnosis not present

## 2022-03-13 MED ORDER — NYSTATIN 100000 UNIT/ML MT SUSP: 5.0000 mL | Freq: Four times a day (QID) | OROMUCOSAL | 0 refills | Status: DC

## 2022-03-13 MED ORDER — HYDROCORTISONE (PERIANAL) 2.5 % EX CREA
TOPICAL_CREAM | CUTANEOUS | 1 refills | Status: DC
Start: 2022-03-13 — End: 2022-11-25

## 2022-03-13 NOTE — Progress Notes (Signed)
Subjective:    Patient ID: Maureen Swanson, female   DOB: 03-Jan-1966, 56 y.o.   MRN: [id number]   HPI  Mouth pain for past 4 days--awakened with the pain one morning. States the pain is under her tongue.  Feels she has  "hole"  there.  Unable to see anything there herself.    She has been using the Advair twice daily and states she rinses with warm water and Listerine after each use.   She does wear only lower dentures (states the state turned her down to obtain upper dentures after she lost them.)  She does clean her dentures regularly as well.  Uses ADvair when her dentures are not in during the day.   Felt she had a fever a couple nights ago.  Her left leg felt hot and forehead felt hot, but did not take temp.   She is taking oxycodone for her knee after surgery for total knee replacement on 02/19/2022.  She is concerned the oxycodone is causing the pain as that is the only new thing. She is in the process of moving from one home to another at [facility] as the house she was in has black mold.   Current Meds  Medication Sig   ABILIFY MAINTENA 400 MG PRSY prefilled syringe Inject 400 mg into the muscle every 30 (thirty) days. Last injection given on 01/10/2021. Next injection due 02/07/2021   Accu-Chek Softclix Lancets lancets Check blood glucose twice daily before meals   albuterol (VENTOLIN HFA) 108 (90 Base) MCG/ACT inhaler Inhale 2 puffs into the lungs every 6 (six) hours as needed for wheezing or shortness of breath.   amLODipine (NORVASC) 10 MG tablet Take 1 tablet (10 mg total) by mouth daily. 1 tab by mouth daily   aspirin (ASPIRIN CHILDRENS) 81 MG chewable tablet Chew 1 tablet (81 mg total) by mouth 2 (two) times daily with a meal.   atenolol (TENORMIN) 100 MG tablet Take 1 tablet (100 mg total) by mouth daily.   atorvastatin (LIPITOR) 40 MG tablet Take 1 tablet (40 mg total) by mouth at bedtime.   benztropine (COGENTIN) 1 MG tablet Take 1 mg by mouth at bedtime.   Blood Glucose  Monitoring Suppl (ACCU-CHEK AVIVA PLUS) w/Device KIT Check blood glucose twice daily before meals   Blood Pressure Monitoring (BLOOD PRESSURE KIT) DEVI Check blood pressure 3 times weekly   cetirizine (ZYRTEC) 10 MG tablet Take 1 tablet (10 mg total) by mouth daily as needed for allergies. (Patient taking differently: Take 10 mg by mouth daily.)   cyclobenzaprine (FLEXERIL) 5 MG tablet Take 5 mg by mouth 3 (three) times daily as needed for muscle spasms.   diclofenac Sodium (VOLTAREN) 1 % GEL Apply 2g to knees up to 4 times daily as needed for pain   fluticasone-salmeterol (ADVAIR) 100-50 MCG/ACT AEPB Inhale 1 puff into the lungs 2 (two) times daily. (Patient taking differently: Inhale 1 puff into the lungs 2 (two) times daily as needed (shortness of breath).)   gabapentin (NEURONTIN) 300 MG capsule Take 1 capsule (300 mg total) by mouth 3 (three) times daily.   glucose blood (ACCU-CHEK AVIVA PLUS) test strip Check blood glucose twice daily before meals   hydrochlorothiazide (HYDRODIURIL) 25 MG tablet 1 tab by mouth with morning meal and Losartan daily   lamoTRIgine (LAMICTAL) 100 MG tablet Take 100 mg by mouth daily.   losartan (COZAAR) 100 MG tablet 1 tab by mouth with HCTZ in morning daily.  melatonin 3 MG TABS tablet Take 1 tablet (3 mg total) by mouth at bedtime.   meloxicam (MOBIC) 15 MG tablet Take 1 tablet (15 mg total) by mouth daily with breakfast.   metFORMIN (GLUCOPHAGE) 1000 MG tablet 1 tab by mouth twice daily with meals   omeprazole (PRILOSEC) 40 MG capsule 1 cap by mouth in morning 1/2 hour before breakfast and other meds   ondansetron (ZOFRAN) 4 MG tablet Take 1 tablet (4 mg total) by mouth every 8 (eight) hours as needed for nausea or vomiting.   polyethylene glycol (MIRALAX) 17 g packet Take 17 g by mouth daily as needed for mild constipation or moderate constipation.   prazosin (MINIPRESS) 1 MG capsule Take 1 mg by mouth at bedtime.   traZODone (DESYREL) 100 MG tablet Take 400  mg by mouth at bedtime.   Allergies  Allergen Reactions   Chocolate Hives   Orange Hives and Other (See Comments)    "Acid foods"   Penicillins Hives and Other (See Comments)    Has patient had a PCN reaction causing immediate rash, facial/tongue/throat swelling, SOB or lightheadedness with hypotension: Yes Has patient had a PCN reaction causing severe rash involving mucus membranes or skin necrosis: Yes Has patient had a PCN reaction that required hospitalization No Has patient had a PCN reaction occurring within the last 10 years: No If all of the above answers are "NO", then may proceed with Cephalosporin use.    Strawberry Extract Swelling   Tomato Hives and Other (See Comments)    "acid foods"     Review of Systems    Objective:   BP (!) 172/110 (BP Location: Right Arm, Patient Position: Sitting, Cuff Size: Normal)   Pulse 100   Resp 12   Ht '5\' 4"'  (1.626 m)   Wt 241 lb (109.3 kg)   BMI 41.37 kg/m   Physical Exam NAD HEENT:  PERRL, EOMI, TMs pearly gray.  Throat without injection or exudate.  Small 80m ulcerative lesion with white edges in sublingual area. Minimal surrounding erythema Neck:  supple, No adenopathy Chest:  CTA CV:  RRR without murmur or rub.  Radial pulses normal and equal. Left leg in knee brace   Assessment & Plan  Apthous ulcer in sublingual area:  Continue with her cleaning of oral cavity following inhaled corticosteroids.  Magic mouthwash 5 ml swish and spit daily until heals.  Call if not improving.  2.  By the way:  was constipated with oxycodone and now with external hemorrhoid:  Anusol HC cream after gentle pat cleaning with Tucks.    3.  HM:  Fluad influenza vaccine.

## 2022-03-26 ENCOUNTER — Other Ambulatory Visit: Payer: Medicaid Other

## 2022-04-01 ENCOUNTER — Ambulatory Visit: Payer: Medicaid Other | Admitting: Internal Medicine

## 2022-04-02 DIAGNOSIS — K649 Unspecified hemorrhoids: Secondary | ICD-10-CM | POA: Insufficient documentation

## 2022-04-02 DIAGNOSIS — K120 Recurrent oral aphthae: Secondary | ICD-10-CM | POA: Insufficient documentation

## 2022-04-02 NOTE — Telephone Encounter (Signed)
See telephone note.

## 2022-04-02 NOTE — Telephone Encounter (Signed)
See phone note

## 2022-04-02 NOTE — Telephone Encounter (Signed)
See telephone note doc

## 2022-04-07 ENCOUNTER — Other Ambulatory Visit: Payer: Self-pay | Admitting: Internal Medicine

## 2022-04-25 ENCOUNTER — Other Ambulatory Visit (INDEPENDENT_AMBULATORY_CARE_PROVIDER_SITE_OTHER): Payer: Medicaid Other

## 2022-04-25 VITALS — BP 174/124 | HR 76

## 2022-04-25 DIAGNOSIS — Z114 Encounter for screening for human immunodeficiency virus [HIV]: Secondary | ICD-10-CM

## 2022-04-25 DIAGNOSIS — Z0130 Encounter for examination of blood pressure without abnormal findings: Secondary | ICD-10-CM | POA: Diagnosis not present

## 2022-04-25 DIAGNOSIS — Z794 Long term (current) use of insulin: Secondary | ICD-10-CM

## 2022-04-25 DIAGNOSIS — E118 Type 2 diabetes mellitus with unspecified complications: Secondary | ICD-10-CM | POA: Diagnosis not present

## 2022-04-25 DIAGNOSIS — E782 Mixed hyperlipidemia: Secondary | ICD-10-CM | POA: Diagnosis not present

## 2022-04-25 DIAGNOSIS — Z1159 Encounter for screening for other viral diseases: Secondary | ICD-10-CM

## 2022-04-25 NOTE — Progress Notes (Unsigned)
Patient reported that she has been taking bp medication consistently. Patient did not take bp medication this morning. She also reported that she has been increasingly stressed lately.

## 2022-04-26 LAB — HEMOGLOBIN A1C
Est. average glucose Bld gHb Est-mCnc: 194 mg/dL
Hgb A1c MFr Bld: 8.4 % — ABNORMAL HIGH (ref 4.8–5.6)

## 2022-04-26 LAB — LIPID PANEL W/O CHOL/HDL RATIO
Cholesterol, Total: 208 mg/dL — ABNORMAL HIGH (ref 100–199)
HDL: 60 mg/dL (ref >39)
LDL Chol Calc (NIH): 111 mg/dL — ABNORMAL HIGH (ref 0–99)
Triglycerides: 217 mg/dL — ABNORMAL HIGH (ref 0–149)
VLDL Cholesterol Cal: 37 mg/dL (ref 5–40)

## 2022-04-26 LAB — HIV ANTIBODY (ROUTINE TESTING W REFLEX): HIV Screen 4th Generation wRfx: NONREACTIVE

## 2022-04-26 LAB — HEPATITIS C ANTIBODY: Hep C Virus Ab: NONREACTIVE

## 2022-05-01 ENCOUNTER — Ambulatory Visit: Payer: Medicaid Other | Admitting: Internal Medicine

## 2022-05-06 ENCOUNTER — Encounter: Payer: Self-pay | Admitting: Internal Medicine

## 2022-05-06 ENCOUNTER — Ambulatory Visit: Payer: Medicaid Other | Admitting: Internal Medicine

## 2022-05-06 VITALS — BP 170/90 | HR 83 | Wt 237.0 lb

## 2022-05-06 DIAGNOSIS — E118 Type 2 diabetes mellitus with unspecified complications: Secondary | ICD-10-CM

## 2022-05-06 DIAGNOSIS — Z794 Long term (current) use of insulin: Secondary | ICD-10-CM

## 2022-05-06 DIAGNOSIS — N762 Acute vulvitis: Secondary | ICD-10-CM | POA: Diagnosis not present

## 2022-05-06 DIAGNOSIS — I10 Essential (primary) hypertension: Secondary | ICD-10-CM | POA: Diagnosis not present

## 2022-05-06 LAB — POCT WET PREP WITH KOH
KOH Prep POC: POSITIVE — AB
RBC Wet Prep HPF POC: NEGATIVE
Trichomonas, UA: NEGATIVE

## 2022-05-06 MED ORDER — MICONAZOLE NITRATE 2 % EX CREA
TOPICAL_CREAM | CUTANEOUS | 0 refills | Status: DC
Start: 2022-05-06 — End: 2022-05-07

## 2022-05-06 NOTE — Progress Notes (Signed)
    Subjective:    Patient ID: Maureen Swanson, female   DOB: 15-Jun-1966, 56 y.o.   MRN: [id number]   HPI   Vaginal irritation for 2 1/2 weeks:  has burning and itching and bumps on edges of labia.   No vaginal discharge, no odor.   Has not been sexually active since maybe August.  She had intercourse with the same person who gave her chlamydia in January.  She is not aware as to whether he has any symptoms of anything.  Currently, not keeping in touch. She has used a new bath gel--only thing she can think of to irritate.   Tried some Vagisil, which did not help.  2.  Hypertension:  did not take bp med this morning   3.  Cannot recall why not taking Atorvastatin.  Allergies  Allergen Reactions   Chocolate Hives   Orange Hives and Other (See Comments)    "Acid foods"   Penicillins Hives and Other (See Comments)    Has patient had a PCN reaction causing immediate rash, facial/tongue/throat swelling, SOB or lightheadedness with hypotension: Yes Has patient had a PCN reaction causing severe rash involving mucus membranes or skin necrosis: Yes Has patient had a PCN reaction that required hospitalization No Has patient had a PCN reaction occurring within the last 10 years: No If all of the above answers are "NO", then may proceed with Cephalosporin use.    Strawberry Extract Swelling   Tomato Hives and Other (See Comments)    "acid foods"     Review of Systems    Objective:   BP (!) 170/90 (BP Location: Left Arm, Patient Position: Sitting, Cuff Size: Normal)   Pulse 83   Wt 237 lb (107.5 kg)   BMI 40.68 kg/m   Physical Exam External vulvar area:  Entire area with old appearing flaccid blisters and fissuring and ulcerative lesions surrounded by moist appearing whitened skin--appears chronically moist.  Assessment & Plan   Vulvar inflammation:  swab taken for Herpes simplex, GC/chlamydia.  Miconazole 2% cream applied twice daily for 7 days or more. Addendum:  call on  05/07/22:  unable to get miconazole 2% at [facility].  She would like me to call in somewhere else.

## 2022-05-07 MED ORDER — CLOTRIMAZOLE 2 % VA CREA
TOPICAL_CREAM | VAGINAL | 1 refills | Status: DC
Start: 2022-05-07 — End: 2022-11-25

## 2022-05-08 LAB — GC/CHLAMYDIA PROBE AMP
Chlamydia trachomatis, NAA: NEGATIVE
Neisseria Gonorrhoeae by PCR: NEGATIVE

## 2022-05-08 LAB — HERPES SIMPLEX VIRUS CULTURE

## 2022-05-22 ENCOUNTER — Other Ambulatory Visit: Payer: Medicaid Other

## 2022-05-23 ENCOUNTER — Other Ambulatory Visit: Payer: Medicaid Other

## 2022-05-23 MED ORDER — ACCU-CHEK AVIVA PLUS VI STRP: ORAL_STRIP | 12 refills | Status: DC

## 2022-05-23 MED ORDER — ACCU-CHEK SOFTCLIX LANCETS MISC: 12 refills | Status: DC

## 2022-05-23 MED ORDER — ACCU-CHEK AVIVA PLUS W/DEVICE KIT: PACK | 0 refills | Status: DC

## 2022-05-26 ENCOUNTER — Encounter: Payer: Self-pay | Admitting: Podiatry

## 2022-05-26 ENCOUNTER — Ambulatory Visit (INDEPENDENT_AMBULATORY_CARE_PROVIDER_SITE_OTHER): Payer: Medicaid Other | Admitting: Podiatry

## 2022-05-26 VITALS — BP 93/63

## 2022-05-26 DIAGNOSIS — M2142 Flat foot [pes planus] (acquired), left foot: Secondary | ICD-10-CM

## 2022-05-26 DIAGNOSIS — M2141 Flat foot [pes planus] (acquired), right foot: Secondary | ICD-10-CM | POA: Diagnosis not present

## 2022-05-26 DIAGNOSIS — E1142 Type 2 diabetes mellitus with diabetic polyneuropathy: Secondary | ICD-10-CM

## 2022-05-26 DIAGNOSIS — E119 Type 2 diabetes mellitus without complications: Secondary | ICD-10-CM | POA: Diagnosis not present

## 2022-05-26 NOTE — Progress Notes (Signed)
ANNUAL DIABETIC FOOT EXAM  Subjective: Maureen Swanson presents today for annual diabetic foot examination. She has h/o plantar fascial fibromatosis of left foot, peroneal tendonitis of right foot. She has been followed by Dr. Milinda Swanson in the past. Chief Complaint  Patient presents with   Nail Problem    [facility] BS- did not check A1C-8.? PCP-Maureen Swanson PCP [facility]- 2 weeks ago   Patient relates 8 year h/o diabetes.  Patient denies any h/o foot wounds.   Patient admits symptoms of foot tingling.  Patient has been diagnosed with neuropathy and it is managed with gabapentin.  Risk factors: diabetes, diabetic neuropathy, h/o CVA, HTN, CHF, hyperlipidemia, h/o tobacco use in remission.  Maureen Hook, MD is patient's PCP.  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 x 12 yrs.   Sleep apnea    Stroke ([place])    1995   Mild. no residual problems   Substance abuse ([place])    crack cocaine:  extensive treatment with [facility] from 2014 to 2018 for this and alcohol abuse.   TMJ (temporomandibular joint disorder)    Patient Active Problem List   Diagnosis Date Noted   Hemorrhoids 04/02/2022   Aphthous ulcer of mouth 04/02/2022   Osteoarthritis of left knee 02/19/2022   OSA (obstructive sleep apnea) 12/31/2021   Acute gout of left foot 12/06/2019   CHF (congestive heart failure) ([place]) 08/03/2016   Cannabis use disorder, moderate, dependence ([place]) 11/20/2015   Homicidal ideation    Tobacco use disorder 07/20/2015   Diabetes  mellitus ([place]) 07/20/2015   Cocaine use disorder, moderate, dependence ([place]) 04/03/2015   Schizoaffective disorder, bipolar type ([place]) 04/03/2015   Alcohol use disorder, moderate, dependence ([place]) 04/03/2015   Acute ischemic stroke ([place]) 12/08/2014   Gout attack 11/12/2014   Unilateral primary osteoarthritis, right knee 11/08/2014   Diabetic peripheral neuropathy ([place]) 11/04/2014   Mixed hyperlipidemia 11/04/2014   Small vessel disease, cerebrovascular 11/04/2014   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   Cocaine abuse ([place]) 02/19/2012   Knee pain 10/17/2010   MAMMOGRAM, ABNORMAL, RIGHT 08/05/2010   AMENORRHEA, SECONDARY 10/29/2009   HERPES ZOSTER 08/20/2009   Trichomonas vaginitis 01/26/2009   GERD 10/11/2007   Essential hypertension 02/26/2007   Past Surgical History:  Procedure Laterality Date   BREAST EXCISIONAL BIOPSY Right    BREAST LUMPECTOMY WITH NEEDLE LOCALIZATION  04/19/2012   Procedure: BREAST LUMPECTOMY WITH NEEDLE LOCALIZATION;  Surgeon: [name], MD;  Location: [facility];  Service: General;  Laterality: Right;   FOOT SURGERY Left 2022   [facility]   KNEE ARTHROPLASTY Left 02/19/2022   Procedure: COMPUTER ASSISTED TOTAL KNEE ARTHROPLASTY;  Surgeon: Rod Can, MD;  Location: [facility];  Service: Orthopedics;  Laterality: Left;   KNEE ARTHROSCOPY W/ PARTIAL MEDIAL MENISCECTOMY  05/01/2010  right   Current Outpatient Medications on File Prior to Visit  Medication Sig Dispense Refill   ABILIFY MAINTENA 400 MG PRSY prefilled syringe Inject 400 mg into the muscle every 30 (thirty) days. Last injection given on 01/10/2021. Next injection due 02/07/2021 (Patient not taking: Reported on 05/06/2022) 1 each    Accu-Chek Softclix Lancets lancets Check blood glucose twice daily before meals 100 each 12   albuterol (VENTOLIN HFA) 108 (90 Base) MCG/ACT inhaler Inhale 2 puffs  into the lungs every 6 (six) hours as needed for wheezing or shortness of breath. 18 g 4   amLODipine (NORVASC) 10 MG tablet Take 1 tablet (10 mg total) by mouth daily. 1 tab by mouth daily 30 tablet 9   atenolol (TENORMIN) 100 MG tablet Take 1 tablet (100 mg total) by mouth daily. 30 tablet 11   atorvastatin (LIPITOR) 40 MG tablet Take 1 tablet (40 mg total) by mouth at bedtime. (Patient not taking: Reported on 05/06/2022) 30 tablet 0   benztropine (COGENTIN) 1 MG tablet Take 1 mg by mouth at bedtime. (Patient not taking: Reported on 05/06/2022)     Blood Glucose Monitoring Suppl (ACCU-CHEK AVIVA PLUS) w/Device KIT Check blood glucose twice daily before meals 1 kit 0   Blood Pressure Monitoring (BLOOD PRESSURE KIT) DEVI Check blood pressure 3 times weekly (Patient not taking: Reported on 05/06/2022) 1 each 0   cetirizine (ZYRTEC) 10 MG tablet Take 1 tablet (10 mg total) by mouth daily as needed for allergies. (Patient taking differently: Take 10 mg by mouth daily.) 30 tablet 11   clotrimazole (GYNE-LOTRIMIN 3) 2 % vaginal cream Use cream externally to affected area twice daily for 3 days. 21 g 1   cyclobenzaprine (FLEXERIL) 5 MG tablet take 1 tablet by mouth at bedtime for muscle spasms AS NEEDED 30 tablet 2   diclofenac Sodium (VOLTAREN) 1 % GEL Apply 2g to knees up to 4 times daily as needed for pain 100 g 6   fluticasone-salmeterol (ADVAIR) 100-50 MCG/ACT AEPB Inhale 1 puff into the lungs 2 (two) times daily. (Patient taking differently: Inhale 1 puff into the lungs 2 (two) times daily as needed (shortness of breath).) 1 each 11   gabapentin (NEURONTIN) 300 MG capsule Take 1 capsule (300 mg total) by mouth 3 (three) times daily. 90 capsule 0   glucose blood (ACCU-CHEK AVIVA PLUS) test strip Check blood glucose twice daily before meals 100 each 12   hydrochlorothiazide (HYDRODIURIL) 25 MG tablet 1 tab by mouth with morning meal and Losartan daily 30 tablet 11   hydrocortisone (ANUSOL-HC) 2.5 %  rectal cream Apply to rectal area twice daily and after every BM after patting area with Tucks 28 g 1   lamoTRIgine (LAMICTAL) 100 MG tablet Take 100 mg by mouth daily.     losartan (COZAAR) 100 MG tablet 1 tab by mouth with HCTZ in morning daily. 30 tablet 11   magic mouthwash (nystatin, lidocaine, diphenhydrAMINE, alum & mag hydroxide) suspension Swish and spit 5 mLs 4 (four) times daily. 200 mL 0   melatonin 3 MG TABS tablet Take 1 tablet (3 mg total) by mouth at bedtime. 30 tablet 0   meloxicam (MOBIC) 15 MG tablet Take 1 tablet (15 mg total) by mouth daily with breakfast. 30 tablet 2   metFORMIN (GLUCOPHAGE) 1000 MG tablet 1 tab by mouth twice daily with meals 60 tablet 11   omeprazole (PRILOSEC) 40 MG capsule 1 cap by mouth in morning 1/2 hour before breakfast and other  meds 30 capsule 11   ondansetron (ZOFRAN) 4 MG tablet Take 1 tablet (4 mg total) by mouth every 8 (eight) hours as needed for nausea or vomiting. 30 tablet 0   prazosin (MINIPRESS) 1 MG capsule Take 1 mg by mouth at bedtime.     traZODone (DESYREL) 100 MG tablet Take 400 mg by mouth at bedtime.     No current facility-administered medications on file prior to visit.    Allergies  Allergen Reactions   Chocolate Hives   Penicillins Hives and Other (See Comments)    Has patient had a PCN reaction causing immediate rash, facial/tongue/throat swelling, SOB or lightheadedness with hypotension: Yes Has patient had a PCN reaction causing severe rash involving mucus membranes or skin necrosis: Yes Has patient had a PCN reaction that required hospitalization No Has patient had a PCN reaction occurring within the last 10 years: No If all of the above answers are "NO", then may proceed with Cephalosporin use.    Orange Hives   Strawberry Extract Swelling   Tomato Hives and Other (See Comments)    "acid foods"   Social History   Occupational History   Not on file  Tobacco Use   Smoking status: Former    Packs/day: 1.00     Years: 37.00    Total pack years: 37.00    Types: Cigarettes    Quit date: 12/14/2020    Years since quitting: 1.4    Passive exposure: Past   Smokeless tobacco: Never  Vaping Use   Vaping Use: Never used  Substance and Sexual Activity   Alcohol use: Not Currently    Comment: History of abuse.  Clean for 9 months dated 12/31/2021   Drug use: Not Currently    Types: Cocaine, "Crack" cocaine    Comment: Clean for 9 months dated 12/31/2021   Sexual activity: Yes    Birth control/protection: Post-menopausal    Comment: Recurrent concerns for STIs with female partner, Maureen Swanson   Family History  Problem Relation Age of Onset   Diabetes Mother    Breast cancer Mother        She is not sure about this diagnosis   Cancer Father        Laryngeal   Multiple sclerosis Sister        not clear if this is her diagnosis   Bipolar disorder Maternal Aunt    Alcoholism Maternal Uncle    Schizophrenia Maternal Grandfather    Immunization History  Administered Date(s) Administered   Fluad Quad(high Dose 65+) 03/13/2022   H1N1 07/11/2008   Influenza Inj Mdck Quad With Preservative 06/14/2021   Influenza Split 06/23/2011, 09/09/2012   Influenza Whole 04/08/2010   Influenza, Seasonal, Injecte, Preservative Fre 03/26/2014   Influenza,inj,Quad PF,6+ Mos 07/22/2014, 04/04/2015, 07/20/2015, 03/03/2016, 02/20/2022   Influenza-Unspecified 04/02/2016, 04/11/2019   Moderna Covid-19 Vaccine Bivalent Booster 70yr & up 06/14/2021   Moderna Sars-Covid-2 Vaccination 10/10/2019, 11/07/2019   PPD Test 07/25/2015   Pneumococcal Polysaccharide-23 09/09/2012, 02/20/2014, 07/20/2015, 10/22/2015, 08/04/2016   Td 07/11/2008, 06/22/2011, 09/18/2021   Zoster Recombinat (Shingrix) 09/18/2021    Review of Systems: Negative except as noted in the HPI.   Objective: Vitals:   05/26/22 1121  BP: 93/63   CNUSAYBAH IVIEis a pleasant 56y.o. female in NAD. AAO X 3.  Vascular Examination: Capillary refill time immediate  b/l. Vascular status intact b/l with palpable pedal pulses. Pedal hair diminished b/l. No edema. No pain with calf compression b/l. Skin temperature gradient  WNL b/l.   Neurological Examination: Pt has subjective symptoms of neuropathy. Sensation grossly intact b/l with 10 gram monofilament. Vibratory sensation intact b/l.   Dermatological Examination: Pedal skin with normal turgor, texture and tone b/l.  No open wounds b/l LE. No interdigital macerations noted b/l LE. Toenails 1-5 b/l well maintained with adequate length. No erythema, no edema, no drainage, no fluctuance.  Musculoskeletal Examination: Muscle strength 5/5 to all lower extremity muscle groups bilaterally. Pes planus deformity noted bilateral LE.  Radiographs: None  Last A1c:      Latest Ref Rng & Units 04/25/2022   11:20 AM 02/11/2022   10:41 AM 12/10/2021    9:08 AM 08/15/2021    9:45 AM  Hemoglobin A1C  Hemoglobin-A1c 4.8 - 5.6 % 8.4  6.3  6.8  5.9    Footwear Assessment: Does the patient wear appropriate shoes? Yes. Does the patient need inserts/orthotics? Yes.  ADA Risk Categorization: Low Risk :  Patient has all of the following: Intact protective sensation No prior foot ulcer  No severe deformity Pedal pulses present  Assessment: 1. Pes planus of both feet   2. Diabetic peripheral neuropathy ([place])   3. Encounter for diabetic foot exam ([place])     Plan: No orders of the defined types were placed in this encounter.  -Patient was evaluated and treated. All patient's and/or POA's questions/concerns answered on today's visit. -Patient with h/o right foot peroneal tendonitis treated by Dr. Milinda Swanson. Schedule appointment with Dr. Milinda Swanson if it become symptomatic again. -Diabetic foot examination performed today. -Patient to continue soft, supportive shoe gear daily. -Toenails trimmed as a courtesy. -Patient/POA to call should there be question/concern in the interim. No follow-ups on file.  Marzetta Board,  DPM

## 2022-05-27 ENCOUNTER — Other Ambulatory Visit: Payer: Self-pay

## 2022-05-27 ENCOUNTER — Emergency Department (HOSPITAL_COMMUNITY)
Admission: EM | Admit: 2022-05-27 | Discharge: 2022-05-27 | Discharge status: Against Medical Advice | Payer: Medicaid Other | Attending: Student | Admitting: Student

## 2022-05-27 ENCOUNTER — Emergency Department (HOSPITAL_COMMUNITY): Payer: Medicaid Other

## 2022-05-27 ENCOUNTER — Encounter (HOSPITAL_COMMUNITY): Payer: Self-pay | Admitting: Emergency Medicine

## 2022-05-27 DIAGNOSIS — Z20822 Contact with and (suspected) exposure to covid-19: Secondary | ICD-10-CM | POA: Diagnosis not present

## 2022-05-27 DIAGNOSIS — R112 Nausea with vomiting, unspecified: Secondary | ICD-10-CM | POA: Diagnosis not present

## 2022-05-27 DIAGNOSIS — R140 Abdominal distension (gaseous): Secondary | ICD-10-CM | POA: Insufficient documentation

## 2022-05-27 DIAGNOSIS — R1013 Epigastric pain: Secondary | ICD-10-CM | POA: Diagnosis present

## 2022-05-27 DIAGNOSIS — K5900 Constipation, unspecified: Secondary | ICD-10-CM | POA: Diagnosis not present

## 2022-05-27 LAB — COMPREHENSIVE METABOLIC PANEL
ALT: 21 U/L (ref 0–44)
AST: 21 U/L (ref 15–41)
Albumin: 4 g/dL (ref 3.5–5.0)
Alkaline Phosphatase: 88 U/L (ref 38–126)
Anion gap: 15 (ref 5–15)
BUN: 26 mg/dL — ABNORMAL HIGH (ref 6–20)
CO2: 21 mmol/L — ABNORMAL LOW (ref 22–32)
Calcium: 9.7 mg/dL (ref 8.9–10.3)
Chloride: 93 mmol/L — ABNORMAL LOW (ref 98–111)
Creatinine, Ser: 1.73 mg/dL — ABNORMAL HIGH (ref 0.44–1.00)
GFR, Estimated: 34 mL/min — ABNORMAL LOW (ref >60)
Glucose, Bld: 687 mg/dL (ref 70–99)
Potassium: 4.4 mmol/L (ref 3.5–5.1)
Sodium: 129 mmol/L — ABNORMAL LOW (ref 135–145)
Total Bilirubin: 0.6 mg/dL (ref 0.3–1.2)
Total Protein: 7.5 g/dL (ref 6.5–8.1)

## 2022-05-27 LAB — CBC WITH DIFFERENTIAL/PLATELET
Abs Immature Granulocytes: 0.02 10*3/uL (ref 0.00–0.07)
Basophils Absolute: 0 10*3/uL (ref 0.0–0.1)
Basophils Relative: 0 %
Eosinophils Absolute: 0.1 10*3/uL (ref 0.0–0.5)
Eosinophils Relative: 1 %
HCT: 40.7 % (ref 36.0–46.0)
Hemoglobin: 13.2 g/dL (ref 12.0–15.0)
Immature Granulocytes: 0 %
Lymphocytes Relative: 19 %
Lymphs Abs: 1.2 10*3/uL (ref 0.7–4.0)
MCH: 28.4 pg (ref 26.0–34.0)
MCHC: 32.4 g/dL (ref 30.0–36.0)
MCV: 87.5 fL (ref 80.0–100.0)
Monocytes Absolute: 0.3 10*3/uL (ref 0.1–1.0)
Monocytes Relative: 5 %
Neutro Abs: 4.5 10*3/uL (ref 1.7–7.7)
Neutrophils Relative %: 75 %
Platelets: 192 10*3/uL (ref 150–400)
RBC: 4.65 MIL/uL (ref 3.87–5.11)
RDW: 12.4 % (ref 11.5–15.5)
WBC: 6 10*3/uL (ref 4.0–10.5)
nRBC: 0 % (ref 0.0–0.2)

## 2022-05-27 LAB — LIPASE, BLOOD: Lipase: 43 U/L (ref 11–51)

## 2022-05-27 MED ORDER — ONDANSETRON 4 MG PO TBDP
8.0000 mg | ORAL_TABLET | Freq: Once | ORAL | Status: AC
  Administered 2022-05-27: 8 mg via ORAL
  Filled 2022-05-27: qty 2

## 2022-05-27 NOTE — ED Notes (Signed)
Pt still sitting in wheelchair in lobby at this time.

## 2022-05-27 NOTE — ED Notes (Signed)
Pt no longer in waiting room

## 2022-05-27 NOTE — ED Provider Triage Note (Signed)
Emergency Medicine Provider Triage Evaluation Note  Maureen Swanson , a 56 y.o. female  was evaluated in triage.  Pt complains of nausea, vomiting, epigastric abdominal pain x 4 to 5 days.  Constant, worse with any p.o. intake which she has been able unable to tolerate secondary to pain.  No previous abdominal surgeries.  Denies chest pain or shortness of breath.  Review of Systems  Per HGPI  Physical Exam  BP (!) 97/59 (BP Location: Right Arm)   Pulse 79   Temp 98.4 F (36.9 C)   Resp 18   SpO2 94%  Gen:   Awake, no distress   Resp:  Normal effort  MSK:   Moves extremities without difficulty  Other:  Abdomen is soft epigastric tenderness  Medical Decision Making  Medically screening exam initiated at 8:58 AM.  Appropriate orders placed.  ATLEE VILLERS was informed that the remainder of the evaluation will be completed by another provider, this initial triage assessment does not replace that evaluation, and the importance of remaining in the ED until their evaluation is complete.     Sherrill Raring, PA-C 05/27/22 [phone]

## 2022-05-27 NOTE — ED Notes (Signed)
Pt told this RN that she was leaving and asked for IV to be removed. Removed IV for pt.

## 2022-05-27 NOTE — ED Triage Notes (Addendum)
Pt arrives via EMS for n/v constipation over the past two days. Although pt states she only has bm twice a week. Chills/no fever. Pt feels like her abd is distended. Pt has no hx of cirrhosis, but has hx of alcohol abuse. Sober for one year. CBG 722, BP 575 systolic, HR 90

## 2022-05-27 NOTE — ED Notes (Signed)
Pt ambulated to bathroom 

## 2022-05-27 NOTE — ED Notes (Signed)
Notified CN about pt still being here and about pt's labs that have resulted

## 2022-06-06 ENCOUNTER — Emergency Department (HOSPITAL_COMMUNITY)
Admission: EM | Admit: 2022-06-06 | Discharge: 2022-06-06 | Disposition: A | Discharge status: Home / Self Care | Payer: Medicaid Other | Attending: Emergency Medicine | Admitting: Emergency Medicine

## 2022-06-06 ENCOUNTER — Ambulatory Visit: Payer: Medicaid Other | Admitting: Internal Medicine

## 2022-06-06 ENCOUNTER — Other Ambulatory Visit: Payer: Self-pay

## 2022-06-06 ENCOUNTER — Encounter (HOSPITAL_COMMUNITY): Payer: Self-pay

## 2022-06-06 DIAGNOSIS — I110 Hypertensive heart disease with heart failure: Secondary | ICD-10-CM | POA: Insufficient documentation

## 2022-06-06 DIAGNOSIS — I509 Heart failure, unspecified: Secondary | ICD-10-CM | POA: Insufficient documentation

## 2022-06-06 DIAGNOSIS — D649 Anemia, unspecified: Secondary | ICD-10-CM | POA: Insufficient documentation

## 2022-06-06 DIAGNOSIS — Z79899 Other long term (current) drug therapy: Secondary | ICD-10-CM | POA: Insufficient documentation

## 2022-06-06 DIAGNOSIS — R739 Hyperglycemia, unspecified: Secondary | ICD-10-CM

## 2022-06-06 DIAGNOSIS — Z794 Long term (current) use of insulin: Secondary | ICD-10-CM | POA: Diagnosis not present

## 2022-06-06 DIAGNOSIS — E1165 Type 2 diabetes mellitus with hyperglycemia: Secondary | ICD-10-CM | POA: Diagnosis not present

## 2022-06-06 DIAGNOSIS — Z7984 Long term (current) use of oral hypoglycemic drugs: Secondary | ICD-10-CM | POA: Insufficient documentation

## 2022-06-06 LAB — CBC WITH DIFFERENTIAL/PLATELET
Abs Immature Granulocytes: 0.02 10*3/uL (ref 0.00–0.07)
Basophils Absolute: 0 10*3/uL (ref 0.0–0.1)
Basophils Relative: 0 %
Eosinophils Absolute: 0 10*3/uL (ref 0.0–0.5)
Eosinophils Relative: 1 %
HCT: 37.2 % (ref 36.0–46.0)
Hemoglobin: 11.6 g/dL — ABNORMAL LOW (ref 12.0–15.0)
Immature Granulocytes: 0 %
Lymphocytes Relative: 17 %
Lymphs Abs: 1 10*3/uL (ref 0.7–4.0)
MCH: 28.2 pg (ref 26.0–34.0)
MCHC: 31.2 g/dL (ref 30.0–36.0)
MCV: 90.3 fL (ref 80.0–100.0)
Monocytes Absolute: 0.4 10*3/uL (ref 0.1–1.0)
Monocytes Relative: 6 %
Neutro Abs: 4.6 10*3/uL (ref 1.7–7.7)
Neutrophils Relative %: 76 %
Platelets: 186 10*3/uL (ref 150–400)
RBC: 4.12 MIL/uL (ref 3.87–5.11)
RDW: 12 % (ref 11.5–15.5)
WBC: 6 10*3/uL (ref 4.0–10.5)
nRBC: 0 % (ref 0.0–0.2)

## 2022-06-06 LAB — COMPREHENSIVE METABOLIC PANEL
ALT: 16 U/L (ref 0–44)
AST: 20 U/L (ref 15–41)
Albumin: 3.6 g/dL (ref 3.5–5.0)
Alkaline Phosphatase: 68 U/L (ref 38–126)
Anion gap: 11 (ref 5–15)
BUN: 15 mg/dL (ref 6–20)
CO2: 22 mmol/L (ref 22–32)
Calcium: 9.3 mg/dL (ref 8.9–10.3)
Chloride: 105 mmol/L (ref 98–111)
Creatinine, Ser: 0.99 mg/dL (ref 0.44–1.00)
GFR, Estimated: >60 mL/min (ref >60)
Glucose, Bld: 395 mg/dL — ABNORMAL HIGH (ref 70–99)
Potassium: 4.3 mmol/L (ref 3.5–5.1)
Sodium: 138 mmol/L (ref 135–145)
Total Bilirubin: 1.2 mg/dL (ref 0.3–1.2)
Total Protein: 6.9 g/dL (ref 6.5–8.1)

## 2022-06-06 LAB — URINALYSIS, ROUTINE W REFLEX MICROSCOPIC
Bilirubin Urine: NEGATIVE
Glucose, UA: >=500 mg/dL — AB
Ketones, ur: 20 mg/dL — AB
Nitrite: NEGATIVE
Protein, ur: NEGATIVE mg/dL
Specific Gravity, Urine: 1.031 — ABNORMAL HIGH (ref 1.005–1.030)
pH: 5 (ref 5.0–8.0)

## 2022-06-06 LAB — CBG MONITORING, ED: Glucose-Capillary: 388 mg/dL — ABNORMAL HIGH (ref 70–99)

## 2022-06-06 MED ORDER — ONDANSETRON 8 MG PO TBDP
8.0000 mg | ORAL_TABLET | Freq: Once | ORAL | Status: AC
  Administered 2022-06-06: 8 mg via ORAL
  Filled 2022-06-06: qty 1

## 2022-06-06 MED ORDER — INSULIN ASPART 100 UNIT/ML IJ SOLN
5.0000 [IU] | Freq: Once | INTRAMUSCULAR | Status: AC
  Administered 2022-06-06: 5 [IU] via SUBCUTANEOUS
  Filled 2022-06-06: qty 0.05

## 2022-06-06 MED ORDER — ACCU-CHEK SOFTCLIX LANCETS MISC: 12 refills | Status: DC

## 2022-06-06 MED ORDER — GLIPIZIDE 5 MG PO TABS: 5.0000 mg | ORAL_TABLET | Freq: Every day | ORAL | 0 refills | Status: DC

## 2022-06-06 NOTE — ED Triage Notes (Signed)
Per EMS- patient states she has been having elevated blood sugars x 1 week. Patient went to [facility] last ED and left due to wait times. Patient states she has only been having symptopms since 0600 today. Patient c/o dizziness, polyuria and dry mouth.

## 2022-06-06 NOTE — Discharge Instructions (Addendum)
The visit emergency department today was overall reassuring.  As discussed, continue to check your blood sugar at home.  Note that glipizide can cause sharp decrease in blood sugar so make sure you are checking it during the day while taking the medicine.  Please take first dose of glipizide tomorrow morning 30 minutes after breakfast.  If you note the blood sugar decreases too much with glipizide, you may cut pill in half and take 2.5 mg instead of 5 mg.  See information attached.  No evidence of DKA or HHS as we discussed.  Recommend reevaluation by your primary care for further diabetic medication adjustment.  Please not hesitate to return to emergency department if the worrisome signs and symptoms we discussed become apparent.

## 2022-06-06 NOTE — ED Notes (Addendum)
Patient given a sandwich and diet ginger ale

## 2022-06-06 NOTE — ED Provider Notes (Signed)
[facility] DEPT Provider Note   CSN: [id number] Arrival date & time: 06/06/22  1239     History  Chief Complaint  Patient presents with   Hyperglycemia    Maureen Swanson is a 56 y.o. female.   Hyperglycemia   56 year old female presents emergency department with complaints of high blood sugar.  Patient reports elevated blood sugar for the last 1 to 2 weeks.  Reports taking metformin at home for blood sugar and has been compliant with diet is unaware of why her blood sugars have been elevating.  Says she has been on insulin as well as glipizide in the past but is only taking metformin currently.  Reports feeling "swimmy headed" nausea, increased thirst, increasing amounts of urination.  Denies visual disturbance, gait abnormality, weakness/sensory deficits in upper or lower extremities, slurring of the speech, to the face.  Denies fever, chills, night sweats, chest pain, shortness of breath, abdominal pain, vaginal symptoms.  Past medical history significant for GERD, CHF, bipolar 1 disorder, TMJ, hypertension, hypercholesterolemia, diabetes mellitus type 2  Home Medications Prior to Admission medications   Medication Sig Start Date End Date Taking? Authorizing Provider  Accu-Chek Softclix Lancets lancets Use as instructed 06/06/22  Yes Dion Saucier A, PA  ABILIFY MAINTENA 400 MG PRSY prefilled syringe Inject 400 mg into the muscle every 30 (thirty) days. Last injection given on 01/10/2021. Next injection due 02/07/2021 Patient not taking: Reported on 05/06/2022 01/09/21   Ethelene Hal, NP  albuterol (VENTOLIN HFA) 108 (90 Base) MCG/ACT inhaler Inhale 2 puffs into the lungs every 6 (six) hours as needed for wheezing or shortness of breath. 09/18/21   Mack Hook, MD  amLODipine (NORVASC) 10 MG tablet Take 1 tablet (10 mg total) by mouth daily. 1 tab by mouth daily 02/26/22   Mack Hook, MD  atenolol (TENORMIN) 100 MG tablet Take 1  tablet (100 mg total) by mouth daily. 09/18/21   Mack Hook, MD  atorvastatin (LIPITOR) 40 MG tablet Take 1 tablet (40 mg total) by mouth at bedtime. Patient not taking: Reported on 05/06/2022 01/09/21   Ethelene Hal, NP  benztropine (COGENTIN) 1 MG tablet Take 1 mg by mouth at bedtime. Patient not taking: Reported on 05/06/2022    [provider]  Blood Glucose Monitoring Suppl (ACCU-CHEK AVIVA PLUS) w/Device KIT Check blood glucose twice daily before meals 05/23/22   Mack Hook, MD  Blood Pressure Monitoring (BLOOD PRESSURE KIT) DEVI Check blood pressure 3 times weekly Patient not taking: Reported on 05/06/2022 08/02/21   Mack Hook, MD  cetirizine (ZYRTEC) 10 MG tablet Take 1 tablet (10 mg total) by mouth daily as needed for allergies. Patient taking differently: Take 10 mg by mouth daily. 09/18/21   Mack Hook, MD  clotrimazole (GYNE-LOTRIMIN 3) 2 % vaginal cream Use cream externally to affected area twice daily for 3 days. 05/07/22   Mack Hook, MD  cyclobenzaprine (FLEXERIL) 5 MG tablet take 1 tablet by mouth at bedtime for muscle spasms AS NEEDED 04/08/22   Mack Hook, MD  diclofenac Sodium (VOLTAREN) 1 % GEL Apply 2g to knees up to 4 times daily as needed for pain 08/19/21   Mack Hook, MD  fluticasone-salmeterol (ADVAIR) 100-50 MCG/ACT AEPB Inhale 1 puff into the lungs 2 (two) times daily. Patient taking differently: Inhale 1 puff into the lungs 2 (two) times daily as needed (shortness of breath). 12/31/21   Mack Hook, MD  gabapentin (NEURONTIN) 300 MG capsule Take 1 capsule (300 mg  total) by mouth 3 (three) times daily. 01/09/21   Ethelene Hal, NP  glipiZIDE (GLUCOTROL) 5 MG tablet Take 1 tablet (5 mg total) by mouth daily before breakfast. 06/06/22   Dion Saucier A, PA  glucose blood (ACCU-CHEK AVIVA PLUS) test strip Check blood glucose twice daily before meals 05/23/22   Mack Hook, MD   hydrochlorothiazide (HYDRODIURIL) 25 MG tablet 1 tab by mouth with morning meal and Losartan daily 09/18/21   Mack Hook, MD  hydrocortisone (ANUSOL-HC) 2.5 % rectal cream Apply to rectal area twice daily and after every BM after patting area with Tucks 03/13/22   Mack Hook, MD  lamoTRIgine (LAMICTAL) 100 MG tablet Take 100 mg by mouth daily. 05/14/21   [provider]  losartan (COZAAR) 100 MG tablet 1 tab by mouth with HCTZ in morning daily. 09/18/21   Mack Hook, MD  magic mouthwash (nystatin, lidocaine, diphenhydrAMINE, alum & mag hydroxide) suspension Swish and spit 5 mLs 4 (four) times daily. 03/13/22   Mack Hook, MD  melatonin 3 MG TABS tablet Take 1 tablet (3 mg total) by mouth at bedtime. 01/09/21   Ethelene Hal, NP  meloxicam (MOBIC) 15 MG tablet Take 1 tablet (15 mg total) by mouth daily with breakfast. 02/20/22   Charlott Rakes, PA-C  metFORMIN (GLUCOPHAGE) 1000 MG tablet 1 tab by mouth twice daily with meals 09/18/21   Mack Hook, MD  omeprazole (PRILOSEC) 40 MG capsule 1 cap by mouth in morning 1/2 hour before breakfast and other meds 09/18/21   Mack Hook, MD  ondansetron (ZOFRAN) 4 MG tablet Take 1 tablet (4 mg total) by mouth every 8 (eight) hours as needed for nausea or vomiting. 02/20/22 02/20/23  Charlott Rakes, PA-C  prazosin (MINIPRESS) 1 MG capsule Take 1 mg by mouth at bedtime.    [provider]  traZODone (DESYREL) 100 MG tablet Take 400 mg by mouth at bedtime.    [provider]      Allergies    Chocolate, Penicillins, Orange, Strawberry extract, and Tomato    Review of Systems   Review of Systems  All other systems reviewed and are negative.   Physical Exam Updated Vital Signs BP 124/80 (BP Location: Right Arm)   Pulse 61   Temp 98.7 F (37.1 C) (Oral)   Resp 16   Ht _0  (1.626 m)   Wt 106.6 kg   SpO2 99%   BMI 40.34 kg/m  Physical Exam Vitals and nursing note reviewed.   Constitutional:      General: She is not in acute distress.    Appearance: She is well-developed.  HENT:     Head: Normocephalic and atraumatic.  Eyes:     Conjunctiva/sclera: Conjunctivae normal.  Cardiovascular:     Rate and Rhythm: Normal rate and regular rhythm.     Heart sounds: No murmur heard. Pulmonary:     Effort: Pulmonary effort is normal. No respiratory distress.     Breath sounds: Normal breath sounds. No wheezing, rhonchi or rales.  Abdominal:     Palpations: Abdomen is soft.     Tenderness: There is no abdominal tenderness.  Musculoskeletal:        General: No swelling.     Cervical back: Neck supple.     Right lower leg: No edema.     Left lower leg: No edema.  Skin:    General: Skin is warm and dry.     Capillary Refill: Capillary refill takes less than  2 seconds.  Neurological:     Mental Status: She is alert.  Psychiatric:        Mood and Affect: Mood normal.     ED Results / Procedures / Treatments   Labs (all labs ordered are listed, but only abnormal results are displayed) Labs Reviewed  COMPREHENSIVE METABOLIC PANEL - Abnormal; Notable for the following components:      Result Value   Glucose, Bld 395 (*)    All other components within normal limits  CBC WITH DIFFERENTIAL/PLATELET - Abnormal; Notable for the following components:   Hemoglobin 11.6 (*)    All other components within normal limits  CBG MONITORING, ED - Abnormal; Notable for the following components:   Glucose-Capillary 388 (*)    All other components within normal limits  URINALYSIS, ROUTINE W REFLEX MICROSCOPIC    EKG None  Radiology No results found.  Procedures Procedures    Medications Ordered in ED Medications  ondansetron (ZOFRAN-ODT) disintegrating tablet 8 mg (8 mg Oral Given 06/06/22 1507)  insulin aspart (novoLOG) injection 5 Units (5 Units Subcutaneous Given 06/06/22 1507)    ED Course/ Medical Decision Making/ A&P                           Medical  Decision Making Amount and/or Complexity of Data Reviewed Labs: ordered.   This patient presents to the ED for concern of high blood sugar, this involves an extensive number of treatment options, and is a complaint that carries with it a high risk of complications and morbidity.  The differential diagnosis includes HHS, DKA, hyperglycemia, sepsis   Co morbidities that complicate the patient evaluation  See HPI   Additional history obtained:  Additional history obtained from EMR External records from outside source obtained and reviewed including hospital records   Lab Tests:  I Ordered, and personally interpreted labs.  The pertinent results include: No leukocytosis noted.  Mild evidence anemia with hemoglobin 11.6 of which is near patient's baseline from prior laboratory studies.  No evidence of platelet dysfunction.  No electrolyte abnormalities noted.  No transaminitis.  No renal dysfunction.  Initial CBG of 388 with repeat 395; patient has yet to receive NovoLog.   Imaging Studies ordered:  N/a   Cardiac Monitoring: / EKG:  The patient was maintained on a cardiac monitor.  I personally viewed and interpreted the cardiac monitored which showed an underlying rhythm of: Sinus rhythm   Consultations Obtained:  N/a   Problem List / ED Course / Critical interventions / Medication management  Hyperglycemia  I ordered medication including 5 units of NovoLog and Zofran for nausea   Reevaluation of the patient after these medicines showed that the patient improved I have reviewed the patients home medicines and have made adjustments as needed   Social Determinants of Health:  Former tobacco use.  Denies illicit drug use.  Test / Admission - Considered:  Hyperglycemia Vitals signs  within normal range and stable throughout visit. Laboratory studies significant for: See above Patient's visit today most consistent with hyperglycemia without evidence of DKA or HHS.   Patient given insulin while emergency department.  Patient on glipizide in the past which has been on for some time.  Plan to resume said medication outpatient for further glycemic control.  Patient has glucometer at home to follow blood sugar at home.  Recommend dietary changes as well as lifestyle changes to help decrease likelihood of sharp increases in blood sugar.  Treatment plan discussed with patient and she acknowledged understanding was agreeable to said plan. Worrisome signs and symptoms were discussed with the patient, and the patient acknowledged understanding to return to the ED if noticed. Patient was stable upon discharge.          Final Clinical Impression(s) / ED Diagnoses Final diagnoses:  Hyperglycemia    Rx / DC Orders ED Discharge Orders          Ordered    glipiZIDE (GLUCOTROL) 5 MG tablet  Daily before breakfast,   Status:  Discontinued        06/06/22 1454    glipiZIDE (GLUCOTROL) 5 MG tablet  Daily before breakfast        06/06/22 1526    Accu-Chek Softclix Lancets lancets        06/06/22 1527              Wilnette Kales, [place] 06/06/22 1529    Fredia Sorrow, MD 06/09/22 1710

## 2022-06-25 ENCOUNTER — Other Ambulatory Visit: Payer: Medicaid Other

## 2022-07-01 ENCOUNTER — Other Ambulatory Visit: Payer: Self-pay | Admitting: Internal Medicine

## 2022-07-04 ENCOUNTER — Other Ambulatory Visit: Payer: Medicaid Other

## 2022-07-08 ENCOUNTER — Ambulatory Visit: Payer: Medicaid Other | Admitting: Internal Medicine

## 2022-08-05 ENCOUNTER — Other Ambulatory Visit: Payer: Medicaid Other

## 2022-08-07 ENCOUNTER — Other Ambulatory Visit: Payer: Self-pay | Admitting: Internal Medicine

## 2022-08-12 ENCOUNTER — Ambulatory Visit: Payer: Medicaid Other | Admitting: Internal Medicine

## 2022-08-25 ENCOUNTER — Other Ambulatory Visit: Payer: Self-pay | Admitting: Internal Medicine

## 2022-08-29 ENCOUNTER — Ambulatory Visit (INDEPENDENT_AMBULATORY_CARE_PROVIDER_SITE_OTHER): Payer: Medicaid Other | Admitting: Podiatry

## 2022-08-29 DIAGNOSIS — Z91199 Patient's noncompliance with other medical treatment and regimen due to unspecified reason: Secondary | ICD-10-CM

## 2022-08-29 NOTE — Progress Notes (Signed)
1. No-show for appointment     

## 2022-09-02 ENCOUNTER — Telehealth: Payer: Self-pay | Admitting: *Deleted

## 2022-09-02 NOTE — Telephone Encounter (Signed)
   Name: Maureen Swanson  DOB: 1966/03/17  MRN: [id number]  Primary Cardiologist: None   Preoperative team, please contact this patient and set up a phone call appointment for further preoperative risk assessment. Please obtain consent and complete medication review. Thank you for your help.   Mayra Reel, NP 09/02/2022, 10:52 AM [place]

## 2022-09-02 NOTE — Telephone Encounter (Signed)
   Pre-operative Risk Assessment    Patient Name: Maureen Swanson  DOB: June 28, 1965 MRN: [id number]     Request for Surgical Clearance    Procedure:   RIGHT TOTAL KNEE   Date of Surgery:  Clearance TBD                                 Surgeon:  DR. Rod Can Surgeon's Group or Practice Name:  [facility] Phone number:  [phone] ATTN: [facility] Fax number:  [phone]   Type of Clearance Requested:   - Medical ; NO MEDICATIONS LISTED AS NEEDING TO BE HELD   Type of Anesthesia:  Spinal   Additional requests/questions:    Jiles Prows   09/02/2022, 8:46 AM

## 2022-09-02 NOTE — Telephone Encounter (Signed)
Pt has been scheduled for tele pre op appt 09/08/22 @ 3 pm. Med rec and consent are done.      Patient Consent for Virtual Visit        TYANNA Swanson has provided verbal consent on 09/02/2022 for a virtual visit (video or telephone).   CONSENT FOR VIRTUAL VISIT FOR:  Maureen Swanson  By participating in this virtual visit I agree to the following:  I hereby voluntarily request, consent and authorize [facility] and its employed or contracted physicians, physician assistants, nurse practitioners or other licensed health care professionals (the Practitioner), to provide me with telemedicine health care services (the "Services") as deemed necessary by the treating Practitioner. I acknowledge and consent to receive the Services by the Practitioner via telemedicine. I understand that the telemedicine visit will involve communicating with the Practitioner through live audiovisual communication technology and the disclosure of certain medical information by electronic transmission. I acknowledge that I have been given the opportunity to request an in-person assessment or other available alternative prior to the telemedicine visit and am voluntarily participating in the telemedicine visit.  I understand that I have the right to withhold or withdraw my consent to the use of telemedicine in the course of my care at any time, without affecting my right to future care or treatment, and that the Practitioner or I may terminate the telemedicine visit at any time. I understand that I have the right to inspect all information obtained and/or recorded in the course of the telemedicine visit and may receive copies of available information for a reasonable fee.  I understand that some of the potential risks of receiving the Services via telemedicine include:  Delay or interruption in medical evaluation due to technological equipment failure or disruption; Information transmitted may not be sufficient (e.g. poor  resolution of images) to allow for appropriate medical decision making by the Practitioner; and/or  In rare instances, security protocols could fail, causing a breach of personal health information.  Furthermore, I acknowledge that it is my responsibility to provide information about my medical history, conditions and care that is complete and accurate to the best of my ability. I acknowledge that Practitioner's advice, recommendations, and/or decision may be based on factors not within their control, such as incomplete or inaccurate data provided by me or distortions of diagnostic images or specimens that may result from electronic transmissions. I understand that the practice of medicine is not an exact science and that Practitioner makes no warranties or guarantees regarding treatment outcomes. I acknowledge that a copy of this consent can be made available to me via my patient portal ([facility]), or I can request a printed copy by calling the office of [facility].    I understand that my insurance will be billed for this visit.   I have read or had this consent read to me. I understand the contents of this consent, which adequately explains the benefits and risks of the Services being provided via telemedicine.  I have been provided ample opportunity to ask questions regarding this consent and the Services and have had my questions answered to my satisfaction. I give my informed consent for the services to be provided through the use of telemedicine in my medical care

## 2022-09-02 NOTE — Telephone Encounter (Signed)
Pt has been scheduled for tele pre op appt 09/08/22 @ 3 pm. Med rec and consent are done.

## 2022-09-08 ENCOUNTER — Encounter: Payer: Self-pay | Admitting: Nurse Practitioner

## 2022-09-08 ENCOUNTER — Ambulatory Visit: Payer: Medicaid Other | Attending: Cardiovascular Disease | Admitting: Nurse Practitioner

## 2022-09-08 ENCOUNTER — Other Ambulatory Visit (INDEPENDENT_AMBULATORY_CARE_PROVIDER_SITE_OTHER): Payer: Medicaid Other

## 2022-09-08 DIAGNOSIS — E118 Type 2 diabetes mellitus with unspecified complications: Secondary | ICD-10-CM | POA: Diagnosis not present

## 2022-09-08 DIAGNOSIS — E785 Hyperlipidemia, unspecified: Secondary | ICD-10-CM

## 2022-09-08 DIAGNOSIS — Z01810 Encounter for preprocedural cardiovascular examination: Secondary | ICD-10-CM | POA: Diagnosis not present

## 2022-09-08 DIAGNOSIS — Z794 Long term (current) use of insulin: Secondary | ICD-10-CM

## 2022-09-08 NOTE — Progress Notes (Signed)
Virtual Visit via Telephone Note   Because of Maureen Swanson co-morbid illnesses, she is at least at moderate risk for complications without adequate follow up.  This format is felt to be most appropriate for this patient at this time.  The patient did not have access to video technology/had technical difficulties with video requiring transitioning to audio format only (telephone).  All issues noted in this document were discussed and addressed.  No physical exam could be performed with this format.  Please refer to the patient's chart for her consent to telehealth for [facility].  Evaluation Performed:  Preoperative cardiovascular risk assessment _____________   Date:  09/08/2022   Patient ID:  Maureen Swanson, DOB [birth year], MRN [id number] Patient Location:  Home Provider location:   Office  Primary Care Provider:  Mack Hook, MD Primary Cardiologist:  Shelva Majestic, MD  Chief Complaint / Patient Profile   57 y.o. y/o female with a h/o morbid obesity, diabetes, HTN, HLD, asthma, GERD, OSA, coronary calcium score of 0, no obstructive CAD on CT 12/2021 who is pending right total knee and presents today for telephonic preoperative cardiovascular risk assessment.  History of Present Illness    Maureen Swanson is a 57 y.o. female who presents via audio/video conferencing for a telehealth visit today.  Pt was last seen in cardiology clinic on 01/15/22 by Dr. Claiborne Billings.  At that time Maureen Swanson was doing well.  The patient is now pending procedure as outlined above. Since her last visit, she denies chest pain, fatigue, palpitations, melena, hematuria, hemoptysis, diaphoresis, weakness, presyncope, syncope, orthopnea, and PND. She reports chronic shortness of breath that is stable. Also has swelling from bilateral knees. She remains active although she is limited by her knee pain. She is able to achieve > 4 METS activity without concerning cardiac symptoms.   Past Medical History     Past Medical History:  Diagnosis Date   Anxiety    Arthritis    back and knees   Asthma    daily and prn inhalers   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 x 12 yrs.   Sleep apnea    Stroke ([place])    1995   Mild. no residual problems   Substance abuse ([place])    crack cocaine:  extensive treatment with [place] from 2014 to 2018 for this and alcohol abuse.   TMJ (temporomandibular joint disorder)    Past Surgical History:  Procedure Laterality Date   BREAST EXCISIONAL BIOPSY Right    BREAST LUMPECTOMY WITH NEEDLE LOCALIZATION  04/19/2012   Procedure: BREAST LUMPECTOMY WITH NEEDLE LOCALIZATION;  Surgeon: [name], MD;  Location: [place];  Service: General;  Laterality: Right;   FOOT SURGERY Left 2022   [facility]   KNEE ARTHROPLASTY Left 02/19/2022   Procedure: COMPUTER ASSISTED TOTAL KNEE ARTHROPLASTY;  Surgeon: Rod Can, MD;  Location: [facility];  Service: Orthopedics;  Laterality: Left;   KNEE ARTHROSCOPY W/ PARTIAL MEDIAL MENISCECTOMY  05/01/2010   right  Allergies  Allergies  Allergen Reactions   Chocolate Hives   Penicillins Hives and Other (See Comments)    Has patient had a PCN reaction causing immediate rash, facial/tongue/throat swelling, SOB or lightheadedness with hypotension: Yes Has patient had a PCN reaction causing severe rash involving mucus membranes or skin necrosis: Yes Has patient had a PCN reaction that required hospitalization No Has patient had a PCN reaction occurring within the last 10 years: No If all of the above answers are "NO", then may proceed with Cephalosporin  use.    Orange Hives   Strawberry Extract Swelling   Tomato Hives and Other (See Comments)    "acid foods"    Home Medications    Prior to Admission medications   Medication Sig Start Date End Date Taking? Authorizing Provider  ABILIFY MAINTENA 400 MG PRSY prefilled syringe Inject 400 mg into the muscle every 30 (thirty) days. Last injection given on 01/10/2021. Next injection due 02/07/2021 Patient not taking: Reported on 05/06/2022 01/09/21   Ethelene Hal, NP  Accu-Chek Softclix Lancets lancets Use as instructed 06/06/22   Dion Saucier A, PA  albuterol (VENTOLIN HFA) 108 (90 Base) MCG/ACT inhaler Inhale 2 puffs into the lungs every 6 (six) hours as needed for wheezing or shortness of breath. 09/18/21   Mack Hook, MD  amLODipine (NORVASC) 10 MG tablet Take 1 tablet (10 mg total) by mouth daily. 1 tab by mouth daily 02/26/22   Mack Hook, MD  atenolol (TENORMIN) 100 MG tablet TAKE ONE TABLET BY MOUTH DAILY 08/29/22   Mack Hook, MD  atorvastatin (LIPITOR) 40 MG tablet Take 1 tablet (40 mg total) by mouth at bedtime. Patient not taking: Reported on 05/06/2022 01/09/21   Ethelene Hal, NP  benztropine (COGENTIN) 1 MG tablet Take 1 mg by mouth at bedtime. Patient not taking: Reported on 05/06/2022    [provider]  Blood Glucose Monitoring Suppl (ACCU-CHEK AVIVA PLUS) w/Device KIT Check blood glucose twice daily before meals 05/23/22   Mack Hook, MD  Blood Pressure Monitoring (BLOOD PRESSURE KIT) DEVI Check blood pressure 3 times weekly Patient not taking: Reported on 05/06/2022 08/02/21   Mack Hook, MD  cetirizine (ZYRTEC) 10 MG tablet TAKE ONE TABLET BY MOUTH DAILY FOR ALLERGIES AS NEEDED 08/29/22   Mack Hook, MD  clotrimazole (GYNE-LOTRIMIN 3) 2 % vaginal cream Use cream externally to affected area twice daily for 3 days. 05/07/22   Mack Hook, MD  cyclobenzaprine (FLEXERIL) 5 MG tablet take 1 tablet by  mouth at bedtime for muscle spasms AS NEEDED 07/02/22   Mack Hook, MD  diclofenac Sodium (VOLTAREN) 1 % GEL APPLY TWO GRAMS TO THE KNEES UP TO FOUR TIMES A DAY AS NEEDED FOR PAIN 08/29/22   Mack Hook, MD  fluticasone-salmeterol (ADVAIR) 100-50 MCG/ACT AEPB Inhale 1 puff into the lungs 2 (two) times daily. Patient taking differently: Inhale 1 puff into the lungs 2 (two) times daily as needed (shortness of breath). 12/31/21   Mack Hook, MD  gabapentin (NEURONTIN) 300 MG capsule Take 1 capsule (300 mg total) by mouth 3 (three) times daily. 01/09/21   Ethelene Hal, NP  glipiZIDE (GLUCOTROL) 5 MG tablet Take 1 tablet (5 mg total) by mouth daily before breakfast. 06/06/22   Dion Saucier A, PA  glucose blood (ACCU-CHEK AVIVA PLUS) test strip Check blood glucose twice daily before meals 05/23/22   Mack Hook, MD  hydrochlorothiazide (HYDRODIURIL) 25 MG tablet TAKE ONE TABLET BY MOUTH DAILY IN THE MORNING WITH  BREAKFAST 08/10/22   Mack Hook, MD  hydrocortisone (ANUSOL-HC) 2.5 % rectal cream Apply to rectal area twice daily and after every BM after patting area with Tucks 03/13/22   Mack Hook, MD  lamoTRIgine (LAMICTAL) 100 MG tablet Take 100 mg by mouth daily. 05/14/21   [provider]  losartan (COZAAR) 100 MG tablet TAKE ONE TABLET BY MOUTH DAILY IN THE MORNING 08/10/22   Mack Hook, MD  magic mouthwash (nystatin, lidocaine, diphenhydrAMINE, alum & mag hydroxide) suspension Swish and spit 5 mLs 4 (four) times daily. 03/13/22   Mack Hook, MD  melatonin 3 MG TABS tablet Take 1 tablet (3 mg total) by mouth at bedtime. 01/09/21   Ethelene Hal, NP  meloxicam (MOBIC) 15 MG tablet Take 1 tablet (15 mg total) by mouth daily with breakfast. 02/20/22   Hill, Marciano Sequin, PA-C  metFORMIN (GLUCOPHAGE) 1000 MG tablet TAKE ONE TABLET BY MOUTH TWICE A DAY WITH MEAL 08/29/22   Mack Hook, MD  omeprazole (PRILOSEC) 40 MG  capsule TAKE ONE CAPSULE BY MOUTH DAILY IN THE MORNING HALF HOUR BEFORE BREAKFAST AND OTHER MEDS 08/10/22   Mack Hook, MD  ondansetron (ZOFRAN) 4 MG tablet Take 1 tablet (4 mg total) by mouth every 8 (eight) hours as needed for nausea or vomiting. 02/20/22 02/20/23  Charlott Rakes, PA-C  prazosin (MINIPRESS) 1 MG capsule Take 1 mg by mouth at bedtime.    [provider]  traZODone (DESYREL) 100 MG tablet Take 400 mg by mouth at bedtime.    [provider]    Physical Exam    Vital Signs:  Maureen Swanson does not have vital signs available for review today.  Given telephonic nature of communication, physical exam is limited. AAOx3. NAD. Normal affect.  Speech and respirations are unlabored.  Accessory Clinical Findings    None  Assessment & Plan    1.  Preoperative Cardiovascular Risk Assessment: According to the Revised Cardiac Risk Index (RCRI), her Perioperative Risk of Major Cardiac Event is (%): 6.6. Her Functional Capacity in METs is: 6.05 according to the Duke Activity Status Index (DASI). The patient is doing well from a cardiac perspective. Therefore, based on ACC/AHA guidelines, the patient would be at acceptable risk for the planned procedure without further cardiovascular testing.   The patient was advised that if she develops new symptoms prior to surgery to contact our office to arrange for a follow-up visit, and she verbalized understanding.  No cardiac medications requested to be held.  A copy of this note will be routed to requesting surgeon.  Time:   Today, I have spent 5 minutes with the patient with telehealth technology discussing medical history, symptoms, and management plan.     Emmaline Life, NP-C  09/08/2022, 2:51 PM [address], [address] Office [phone] Fax [phone]

## 2022-09-09 LAB — HEMOGLOBIN A1C
Est. average glucose Bld gHb Est-mCnc: 192 mg/dL
Hgb A1c MFr Bld: 8.3 % — ABNORMAL HIGH (ref 4.8–5.6)

## 2022-09-09 LAB — LIPID PANEL W/O CHOL/HDL RATIO
Cholesterol, Total: 233 mg/dL — ABNORMAL HIGH (ref 100–199)
HDL: 51 mg/dL (ref >39)
LDL Chol Calc (NIH): 144 mg/dL — ABNORMAL HIGH (ref 0–99)
Triglycerides: 211 mg/dL — ABNORMAL HIGH (ref 0–149)
VLDL Cholesterol Cal: 38 mg/dL (ref 5–40)

## 2022-09-13 ENCOUNTER — Other Ambulatory Visit: Payer: Self-pay

## 2022-09-13 ENCOUNTER — Encounter (HOSPITAL_COMMUNITY): Payer: Self-pay

## 2022-09-13 ENCOUNTER — Emergency Department (HOSPITAL_COMMUNITY)
Admission: EM | Admit: 2022-09-13 | Discharge: 2022-09-13 | Discharge status: Against Medical Advice | Payer: Medicaid Other | Attending: Emergency Medicine | Admitting: Emergency Medicine

## 2022-09-13 DIAGNOSIS — Z79899 Other long term (current) drug therapy: Secondary | ICD-10-CM | POA: Diagnosis not present

## 2022-09-13 DIAGNOSIS — E119 Type 2 diabetes mellitus without complications: Secondary | ICD-10-CM | POA: Diagnosis not present

## 2022-09-13 DIAGNOSIS — Z5321 Procedure and treatment not carried out due to patient leaving prior to being seen by health care provider: Secondary | ICD-10-CM | POA: Insufficient documentation

## 2022-09-13 DIAGNOSIS — I509 Heart failure, unspecified: Secondary | ICD-10-CM | POA: Diagnosis not present

## 2022-09-13 DIAGNOSIS — R112 Nausea with vomiting, unspecified: Secondary | ICD-10-CM | POA: Diagnosis present

## 2022-09-13 DIAGNOSIS — R10817 Generalized abdominal tenderness: Secondary | ICD-10-CM | POA: Insufficient documentation

## 2022-09-13 DIAGNOSIS — I110 Hypertensive heart disease with heart failure: Secondary | ICD-10-CM | POA: Insufficient documentation

## 2022-09-13 LAB — CBC WITH DIFFERENTIAL/PLATELET
Abs Immature Granulocytes: 0.03 K/uL (ref 0.00–0.07)
Basophils Absolute: 0 K/uL (ref 0.0–0.1)
Basophils Relative: 0 %
Eosinophils Absolute: 0.1 K/uL (ref 0.0–0.5)
Eosinophils Relative: 1 %
HCT: 33.3 % — ABNORMAL LOW (ref 36.0–46.0)
Hemoglobin: 10.5 g/dL — ABNORMAL LOW (ref 12.0–15.0)
Immature Granulocytes: 0 %
Lymphocytes Relative: 15 %
Lymphs Abs: 1.1 K/uL (ref 0.7–4.0)
MCH: 29.3 pg (ref 26.0–34.0)
MCHC: 31.5 g/dL (ref 30.0–36.0)
MCV: 93 fL (ref 80.0–100.0)
Monocytes Absolute: 0.4 K/uL (ref 0.1–1.0)
Monocytes Relative: 5 %
Neutro Abs: 6 K/uL (ref 1.7–7.7)
Neutrophils Relative %: 79 %
Platelets: 215 K/uL (ref 150–400)
RBC: 3.58 MIL/uL — ABNORMAL LOW (ref 3.87–5.11)
RDW: 11.9 % (ref 11.5–15.5)
WBC: 7.6 K/uL (ref 4.0–10.5)
nRBC: 0 % (ref 0.0–0.2)

## 2022-09-13 LAB — COMPREHENSIVE METABOLIC PANEL
ALT: 10 U/L (ref 0–44)
AST: 18 U/L (ref 15–41)
Albumin: 3.4 g/dL — ABNORMAL LOW (ref 3.5–5.0)
Alkaline Phosphatase: 59 U/L (ref 38–126)
Anion gap: 14 (ref 5–15)
BUN: 16 mg/dL (ref 6–20)
CO2: 24 mmol/L (ref 22–32)
Calcium: 9.2 mg/dL (ref 8.9–10.3)
Chloride: 101 mmol/L (ref 98–111)
Creatinine, Ser: 0.9 mg/dL (ref 0.44–1.00)
GFR, Estimated: >60 mL/min (ref >60)
Glucose, Bld: 135 mg/dL — ABNORMAL HIGH (ref 70–99)
Potassium: 3.7 mmol/L (ref 3.5–5.1)
Sodium: 139 mmol/L (ref 135–145)
Total Bilirubin: 0.3 mg/dL (ref 0.3–1.2)
Total Protein: 6.8 g/dL (ref 6.5–8.1)

## 2022-09-13 LAB — WET PREP, GENITAL
Clue Cells Wet Prep HPF POC: NONE SEEN
Sperm: NONE SEEN
Trich, Wet Prep: NONE SEEN
WBC, Wet Prep HPF POC: >=10 — AB
Yeast Wet Prep HPF POC: NONE SEEN

## 2022-09-13 LAB — URINALYSIS, ROUTINE W REFLEX MICROSCOPIC
Bilirubin Urine: NEGATIVE
Glucose, UA: NEGATIVE mg/dL
Hgb urine dipstick: NEGATIVE
Ketones, ur: NEGATIVE mg/dL
Leukocytes,Ua: NEGATIVE
Nitrite: NEGATIVE
Protein, ur: NEGATIVE mg/dL
Specific Gravity, Urine: 1.019 (ref 1.005–1.030)
pH: 7 (ref 5.0–8.0)

## 2022-09-13 LAB — LIPASE, BLOOD: Lipase: 28 U/L (ref 11–51)

## 2022-09-13 LAB — HIV ANTIBODY (ROUTINE TESTING W REFLEX): HIV Screen 4th Generation wRfx: NONREACTIVE

## 2022-09-13 MED ORDER — ACETAMINOPHEN 500 MG PO TABS
1000.0000 mg | ORAL_TABLET | Freq: Once | ORAL | Status: AC
  Administered 2022-09-13: 1000 mg via ORAL
  Filled 2022-09-13: qty 2

## 2022-09-13 MED ORDER — ONDANSETRON HCL 4 MG/2ML IJ SOLN
4.0000 mg | Freq: Once | INTRAMUSCULAR | Status: AC
  Administered 2022-09-13: 4 mg via INTRAVENOUS
  Filled 2022-09-13: qty 2

## 2022-09-13 MED ORDER — SODIUM CHLORIDE 0.9 % IV BOLUS
1000.0000 mL | Freq: Once | INTRAVENOUS | Status: AC
  Administered 2022-09-13: 1000 mL via INTRAVENOUS

## 2022-09-13 NOTE — ED Provider Notes (Signed)
[facility] Provider Note   CSN: [id number] Arrival date & time: 09/13/22  1322     History  Chief Complaint  Patient presents with  . Emesis    Maureen Swanson is a 57 y.o. female with a past medical history of elevated cholesterol, substance abuse, hypertension, diabetes, CHF, stroke, who presents to the emergency department with concerns for nausea and vomiting x 1 week.  Has associated lower abdominal pain, vaginal spotting.  No meds tried prior to arrival.  Notes that her pain is worse with eating.  She still has her gallbladder and appendix.  Denies chest pain, shortness of breath, fever, dysuria, hematuria, vaginal discharge.   The history is provided by the patient. No language interpreter was used.       Home Medications Prior to Admission medications   Medication Sig Start Date End Date Taking? Authorizing Provider  ABILIFY MAINTENA 400 MG PRSY prefilled syringe Inject 400 mg into the muscle every 30 (thirty) days. Last injection given on 01/10/2021. Next injection due 02/07/2021 Patient not taking: Reported on 05/06/2022 01/09/21   Ethelene Hal, NP  Accu-Chek Softclix Lancets lancets Use as instructed 06/06/22   Dion Saucier A, PA  albuterol (VENTOLIN HFA) 108 (90 Base) MCG/ACT inhaler Inhale 2 puffs into the lungs every 6 (six) hours as needed for wheezing or shortness of breath. 09/18/21   Mack Hook, MD  amLODipine (NORVASC) 10 MG tablet Take 1 tablet (10 mg total) by mouth daily. 1 tab by mouth daily 02/26/22   Mack Hook, MD  atenolol (TENORMIN) 100 MG tablet TAKE ONE TABLET BY MOUTH DAILY 08/29/22   Mack Hook, MD  atorvastatin (LIPITOR) 40 MG tablet Take 1 tablet (40 mg total) by mouth at bedtime. Patient not taking: Reported on 05/06/2022 01/09/21   Ethelene Hal, NP  benztropine (COGENTIN) 1 MG tablet Take 1 mg by mouth at bedtime. Patient not taking: Reported on 05/06/2022     [provider]  Blood Glucose Monitoring Suppl (ACCU-CHEK AVIVA PLUS) w/Device KIT Check blood glucose twice daily before meals 05/23/22   Mack Hook, MD  Blood Pressure Monitoring (BLOOD PRESSURE KIT) DEVI Check blood pressure 3 times weekly Patient not taking: Reported on 05/06/2022 08/02/21   Mack Hook, MD  cetirizine (ZYRTEC) 10 MG tablet TAKE ONE TABLET BY MOUTH DAILY FOR ALLERGIES AS NEEDED 08/29/22   Mack Hook, MD  clotrimazole (GYNE-LOTRIMIN 3) 2 % vaginal cream Use cream externally to affected area twice daily for 3 days. 05/07/22   Mack Hook, MD  cyclobenzaprine (FLEXERIL) 5 MG tablet take 1 tablet by mouth at bedtime for muscle spasms AS NEEDED 07/02/22   Mack Hook, MD  diclofenac Sodium (VOLTAREN) 1 % GEL APPLY TWO GRAMS TO THE KNEES UP TO FOUR TIMES A DAY AS NEEDED FOR PAIN 08/29/22   Mack Hook, MD  fluticasone-salmeterol (ADVAIR) 100-50 MCG/ACT AEPB Inhale 1 puff into the lungs 2 (two) times daily. Patient taking differently: Inhale 1 puff into the lungs 2 (two) times daily as needed (shortness of breath). 12/31/21   Mack Hook, MD  gabapentin (NEURONTIN) 300 MG capsule Take 1 capsule (300 mg total) by mouth 3 (three) times daily. 01/09/21   Ethelene Hal, NP  glipiZIDE (GLUCOTROL) 5 MG tablet Take 1 tablet (5 mg total) by mouth daily before breakfast. 06/06/22   Dion Saucier A, PA  glucose blood (ACCU-CHEK AVIVA PLUS) test strip Check blood glucose twice daily before meals 05/23/22  Mack Hook, MD  hydrochlorothiazide (HYDRODIURIL) 25 MG tablet TAKE ONE TABLET BY MOUTH DAILY IN THE MORNING WITH BREAKFAST 08/10/22   Mack Hook, MD  hydrocortisone (ANUSOL-HC) 2.5 % rectal cream Apply to rectal area twice daily and after every BM after patting area with Tucks 03/13/22   Mack Hook, MD  lamoTRIgine (LAMICTAL) 100 MG tablet Take 100 mg by mouth daily. 05/14/21   [provider]   losartan (COZAAR) 100 MG tablet TAKE ONE TABLET BY MOUTH DAILY IN THE MORNING 08/10/22   Mack Hook, MD  magic mouthwash (nystatin, lidocaine, diphenhydrAMINE, alum & mag hydroxide) suspension Swish and spit 5 mLs 4 (four) times daily. 03/13/22   Mack Hook, MD  melatonin 3 MG TABS tablet Take 1 tablet (3 mg total) by mouth at bedtime. 01/09/21   Ethelene Hal, NP  meloxicam (MOBIC) 15 MG tablet Take 1 tablet (15 mg total) by mouth daily with breakfast. 02/20/22   Hill, Marciano Sequin, PA-C  metFORMIN (GLUCOPHAGE) 1000 MG tablet TAKE ONE TABLET BY MOUTH TWICE A DAY WITH MEAL 08/29/22   Mack Hook, MD  omeprazole (PRILOSEC) 40 MG capsule TAKE ONE CAPSULE BY MOUTH DAILY IN THE MORNING HALF HOUR BEFORE BREAKFAST AND OTHER MEDS 08/10/22   Mack Hook, MD  ondansetron (ZOFRAN) 4 MG tablet Take 1 tablet (4 mg total) by mouth every 8 (eight) hours as needed for nausea or vomiting. 02/20/22 02/20/23  Charlott Rakes, PA-C  prazosin (MINIPRESS) 1 MG capsule Take 1 mg by mouth at bedtime.    [provider]  traZODone (DESYREL) 100 MG tablet Take 400 mg by mouth at bedtime.    [provider]      Allergies    Chocolate, Penicillins, Orange, Strawberry extract, and Tomato    Review of Systems   Review of Systems  Gastrointestinal:  Positive for vomiting.    Physical Exam Updated Vital Signs BP (!) 148/86   Pulse 68   Temp 99.6 F (37.6 C) (Oral)   Resp 18   SpO2 98%  Physical Exam Vitals and nursing note reviewed. Exam conducted with a chaperone present.  Constitutional:      General: She is not in acute distress.    Appearance: Normal appearance. She is not ill-appearing, toxic-appearing or diaphoretic.  HENT:     Head: Normocephalic and atraumatic.     Right Ear: External ear normal.     Left Ear: External ear normal.     Mouth/Throat:     Pharynx: No oropharyngeal exudate.  Eyes:     General: No scleral icterus.    Extraocular Movements:  Extraocular movements intact.     Conjunctiva/sclera: Conjunctivae normal.  Cardiovascular:     Rate and Rhythm: Normal rate and regular rhythm.     Pulses: Normal pulses.     Heart sounds: Normal heart sounds.  Pulmonary:     Effort: Pulmonary effort is normal. No respiratory distress.     Breath sounds: Normal breath sounds. No wheezing.  Abdominal:     General: Abdomen is flat. Bowel sounds are normal. There is no distension.     Palpations: Abdomen is soft. There is no mass.     Tenderness: There is generalized abdominal tenderness. There is no guarding or rebound.     Hernia: There is no hernia in the left inguinal area or right inguinal area.     Comments: Diffuse abdominal TTP.   Genitourinary:    Pubic Area: No rash.  Labia:        Right: No rash, tenderness, lesion or injury.        Left: No rash, tenderness, lesion or injury.      Vagina: No signs of injury and foreign body. Bleeding (minimal spotting noted) present. No vaginal discharge, erythema or tenderness.     Cervix: Normal.     Uterus: Normal. Not deviated, not enlarged, not fixed and not tender.      Adnexa: Right adnexa normal and left adnexa normal.     Comments: RN chaperone Sheryle Spray Duard Larsen) present for exam. Musculoskeletal:        General: Normal range of motion.     Cervical back: Normal range of motion and neck supple.  Lymphadenopathy:     Lower Body: No right inguinal adenopathy. No left inguinal adenopathy.  Skin:    General: Skin is warm and dry.  Neurological:     Mental Status: She is alert.  Psychiatric:        Behavior: Behavior normal.    ED Results / Procedures / Treatments   Labs (all labs ordered are listed, but only abnormal results are displayed) Labs Reviewed  CBC WITH DIFFERENTIAL/PLATELET  COMPREHENSIVE METABOLIC PANEL  LIPASE, BLOOD  URINALYSIS, ROUTINE W REFLEX MICROSCOPIC    EKG None  Radiology No results found.  Procedures Procedures    Medications Ordered  in ED Medications  sodium chloride 0.9 % bolus 1,000 mL (has no administration in time range)  ondansetron (ZOFRAN) injection 4 mg (has no administration in time range)    ED Course/ Medical Decision Making/ A&P Clinical Course as of 09/13/22 1752  Sat Sep 13, 2022  1557 Pt visualized to be ambulating without assistance or difficulty.  [SB]  [id number] Notified by RN that patient wanted to leave AMA.  In-depth conversation held with patient regarding leaving AMA and concerns for due to incomplete workup patient could have an emergent finding that we have yet to find.  Patient acknowledges an understanding leaving AMA in the process of that. [SB]    Clinical Course User Index [SB] Hailly Fess A, PA-C                             Medical Decision Making Amount and/or Complexity of Data Reviewed Labs: ordered. Radiology: ordered.  Risk OTC drugs. Prescription drug management.   Patient presents to the emergency department with *** x *** days.  Patient has a history of ***{gastroparesis or GI related}. {Any previous ED visits}. Pt has GI follow up with {GI group and when.}. On exam patient with {diffuse abdominal TTP. No focal abdominal pain.} Remainder of exam without acute findings.  Pt afebrile. *** Differential diagnosis includes pancreatitis, cholecystitis, appendicitis, UTI, GERD, gastroparesis exacerbation, cannabinoid hyperemesis syndrome. ***   Additional history obtained:  Additional history obtained from {sabhistory:27144} External records from outside source obtained and reviewed including: ***  Labs:  I ordered, and personally interpreted labs.  The pertinent results include:  {sablabs:28098}  Imaging: I ordered imaging studies including {sabimaging:28099} I independently visualized and interpreted imaging which showed *** I agree with the radiologist interpretation  Medications:  I ordered medication including {sabmeds:28100} for ***. {GI cocktail} Reevaluation of the  patient after these medicines and interventions, I reevaluated the patient and found that they have {resolved/improved/worsened:23923::"improved"} I have reviewed the patients home medicines and have made adjustments as needed Tolerated PO challenge in ED with above treatment regimen ***  {Cardiac Monitoring:  The patient was maintained on a cardiac monitor.  I personally viewed and interpreted the cardiac monitored which showed an underlying rhythm of: ***.   Test Considered: ***   Critical Interventions ***}   {Consultations: I requested consultation with the {sabspecialists:27145}, and discussed lab and imaging findings as well as pertinent plan - they recommend: ***}   Disposition: {End of MDM here with the likely diagnosis}. After consideration of the diagnostic results and the patients response to treatment, I feel that the patient would benefit from {sabdispo:27146}. {Discharge home with prescription Zofran/phenergan.} {Will provide information for on-call Gastroenterologist. Instructed pt to call and set up follow up appointment regarding todays ED visit with the gastroenterologist.} Supportive care measures and strict return precautions discussed with patient at bedside. Pt acknowledges and verbalizes understanding. Pt appears safe for discharge. Follow up as indicated in discharge paperwork.   This chart was dictated using voice recognition software, Dragon. Despite the best efforts of this provider to proofread and correct errors, errors may still occur which can change documentation meaning.   Final Clinical Impression(s) / ED Diagnoses Final diagnoses:  None    Rx / DC Orders ED Discharge Orders     None

## 2022-09-13 NOTE — ED Triage Notes (Signed)
Pt BIB [facility] from home for nausea/vomiting for 1 week with lower abdominal pain on both sides. 8/10 pain reported by patient at this time, worse with eating, cannot tolerate any intake. Patient also endorses spotting from her vagina as well. NAD, A&O x4. VSS.

## 2022-09-13 NOTE — ED Notes (Signed)
Pt ambulated to the bathroom. Pt's gait is steady and is able to walk independently.

## 2022-09-14 LAB — RPR: RPR Ser Ql: NONREACTIVE

## 2022-09-15 ENCOUNTER — Telehealth: Payer: Self-pay

## 2022-09-15 LAB — GC/CHLAMYDIA PROBE AMP (~~LOC~~) NOT AT ARMC
Chlamydia: NEGATIVE
Comment: NEGATIVE
Comment: NORMAL
Neisseria Gonorrhea: NEGATIVE

## 2022-09-15 NOTE — Telephone Encounter (Signed)
Patient called today to be on the waiting list in case there is a cancelation before her appointment on 10/13/22, patient stated she would like to be seen before an upcoming surgery that she has and that she also has a big bruise on the back of her leg.

## 2022-09-29 ENCOUNTER — Other Ambulatory Visit: Payer: Self-pay | Admitting: Internal Medicine

## 2022-10-01 NOTE — Telephone Encounter (Signed)
PLease call patient and find out what she is doing with her meds.  We have moved her meds to different pharmacies multiple times and concerned what is happening

## 2022-10-02 ENCOUNTER — Other Ambulatory Visit: Payer: Self-pay | Admitting: Internal Medicine

## 2022-10-02 DIAGNOSIS — Z1231 Encounter for screening mammogram for malignant neoplasm of breast: Secondary | ICD-10-CM

## 2022-10-10 NOTE — Telephone Encounter (Signed)
Patient has been getting Rx for [facility] previously. Patient stopped getting medications form [facility] because some medications where not available there.

## 2022-10-13 ENCOUNTER — Ambulatory Visit (INDEPENDENT_AMBULATORY_CARE_PROVIDER_SITE_OTHER): Payer: Medicaid Other | Admitting: Internal Medicine

## 2022-10-13 ENCOUNTER — Encounter: Payer: Self-pay | Admitting: Internal Medicine

## 2022-10-13 VITALS — BP 156/100 | HR 60 | Resp 16 | Ht 64.0 in | Wt 221.0 lb

## 2022-10-13 DIAGNOSIS — E118 Type 2 diabetes mellitus with unspecified complications: Secondary | ICD-10-CM | POA: Diagnosis not present

## 2022-10-13 DIAGNOSIS — F250 Schizoaffective disorder, bipolar type: Secondary | ICD-10-CM

## 2022-10-13 DIAGNOSIS — I10 Essential (primary) hypertension: Secondary | ICD-10-CM | POA: Diagnosis not present

## 2022-10-13 DIAGNOSIS — E782 Mixed hyperlipidemia: Secondary | ICD-10-CM | POA: Diagnosis not present

## 2022-10-13 DIAGNOSIS — Z794 Long term (current) use of insulin: Secondary | ICD-10-CM

## 2022-10-13 DIAGNOSIS — K120 Recurrent oral aphthae: Secondary | ICD-10-CM | POA: Diagnosis not present

## 2022-10-13 MED ORDER — AMLODIPINE BESYLATE 10 MG PO TABS: 10.0000 mg | ORAL_TABLET | Freq: Every day | ORAL | 11 refills | Status: AC

## 2022-10-13 MED ORDER — EMPAGLIFLOZIN 10 MG PO TABS: 10.0000 mg | ORAL_TABLET | Freq: Every day | ORAL | 11 refills | Status: AC

## 2022-10-13 MED ORDER — CYCLOBENZAPRINE HCL 5 MG PO TABS: 5.0000 mg | ORAL_TABLET | Freq: Every evening | ORAL | 0 refills | Status: DC | PRN

## 2022-10-13 MED ORDER — GABAPENTIN 300 MG PO CAPS: 300.0000 mg | ORAL_CAPSULE | Freq: Three times a day (TID) | ORAL | 11 refills | Status: DC

## 2022-10-13 MED ORDER — NYSTATIN 100000 UNIT/ML MT SUSP: 5.0000 mL | Freq: Four times a day (QID) | OROMUCOSAL | 0 refills | Status: DC

## 2022-10-13 MED ORDER — BLOOD PRESSURE KIT DEVI: 0 refills | Status: DC

## 2022-10-13 MED ORDER — DEXCOM G7 SENSOR MISC: 11 refills | Status: DC

## 2022-10-13 MED ORDER — ATORVASTATIN CALCIUM 40 MG PO TABS: 40.0000 mg | ORAL_TABLET | Freq: Every day | ORAL | 11 refills | Status: AC

## 2022-10-13 NOTE — Progress Notes (Signed)
Subjective:    Patient ID: Maureen Swanson, female   DOB: 01-02-1966, 57 y.o.   MRN: [id number]   HPI  Here to clear for TKR of right knee medical clearance.  Has been cleared by Cardiology  Was concerned as her meds were being moved to yet another pharmacy.     DM:  discussed her A1C went up to 8.4% in November of 2023 and remained high at 8.3% about 1 month ago.  She admits to eating poorly and drinking regular sodas. States she was drinking 4-6 regular sodas daily.  States she stopped 2 months ago and only drinking two sixteen oz pepsi zeros now.  She states her sugars have ranged 92 to 135 the past 2 weeks.  She has also cut out a lot of the starches she was eating.   She would like a continuous glucose monitor.   Discussed A1C for surgery is 7.9% or lower  2.  Hypertension:  states she did not take her meds this morning as awakened late and could not eat before leaving to get here.  Not using Amlodipine currently.  3.  Bipolar Disorder:  At one point, there was a concern she had Tardive dyskinesia.   She believes she just has a tremor. Ingrezza just started 2 weeks ago.  No change per patient   4.  Hyperlipidemia:  Cholesterol elevated with last 2 checks, the last 09/08/22.  One year ago, was at goal with all levels.  Appears she is missing her atorvastatin--not clear if med not transferred to new pharmacy as she has been moving around quite a bit with pharmacies. Lipid Panel     Component Value Date/Time   CHOL 233 (H) 09/08/2022 0826   TRIG 211 (H) 09/08/2022 0826   HDL 51 09/08/2022 0826   CHOLHDL 3.1 05/29/2021 1632   VLDL 33 05/29/2021 1632   LDLCALC 144 (H) 09/08/2022 0826   LABVLDL 38 09/08/2022 0826   5.  Oral sore:  Using Advair.  May not be brushing teeth and tongue regularly after use.   Current Meds  Medication Sig   ABILIFY MAINTENA 400 MG PRSY prefilled syringe Inject 400 mg into the muscle every 30 (thirty) days. Last injection given on 01/10/2021. Next  injection due 02/07/2021   Accu-Chek Softclix Lancets lancets Use as instructed   albuterol (VENTOLIN HFA) 108 (90 Base) MCG/ACT inhaler INHALE 2 PUFFS BY MOUTH EVERY SIX HOURS AS NEEDED FOR SHORTNESS OF BREATH   atenolol (TENORMIN) 100 MG tablet TAKE ONE TABLET BY MOUTH DAILY   benztropine (COGENTIN) 1 MG tablet Take 1 mg by mouth at bedtime.   Blood Glucose Monitoring Suppl (ACCU-CHEK AVIVA PLUS) w/Device KIT Check blood glucose twice daily before meals   cetirizine (ZYRTEC) 10 MG tablet TAKE ONE TABLET BY MOUTH DAILY FOR ALLERGIES AS NEEDED   clotrimazole (GYNE-LOTRIMIN 3) 2 % vaginal cream Use cream externally to affected area twice daily for 3 days.   diclofenac Sodium (VOLTAREN) 1 % GEL APPLY TWO GRAMS TO THE KNEES UP TO FOUR TIMES A DAY AS NEEDED FOR PAIN   fluticasone-salmeterol (ADVAIR) 100-50 MCG/ACT AEPB Inhale 1 puff into the lungs 2 (two) times daily. (Patient taking differently: Inhale 1 puff into the lungs 2 (two) times daily as needed (shortness of breath).)   glucose blood (ACCU-CHEK AVIVA PLUS) test strip Check blood glucose twice daily before meals   hydrochlorothiazide (HYDRODIURIL) 25 MG tablet TAKE ONE TABLET BY MOUTH DAILY IN THE MORNING WITH BREAKFAST  hydrocortisone (ANUSOL-HC) 2.5 % rectal cream Apply to rectal area twice daily and after every BM after patting area with Tucks   lamoTRIgine (LAMICTAL) 100 MG tablet Take 100 mg by mouth daily.   losartan (COZAAR) 100 MG tablet TAKE ONE TABLET BY MOUTH DAILY IN THE MORNING   magic mouthwash (nystatin, lidocaine, diphenhydrAMINE, alum & mag hydroxide) suspension Swish and spit 5 mLs 4 (four) times daily.   melatonin 3 MG TABS tablet Take 1 tablet (3 mg total) by mouth at bedtime.   meloxicam (MOBIC) 15 MG tablet Take 1 tablet (15 mg total) by mouth daily with breakfast.   metFORMIN (GLUCOPHAGE) 1000 MG tablet TAKE ONE TABLET BY MOUTH TWICE A DAY WITH MEAL   omeprazole (PRILOSEC) 40 MG capsule TAKE ONE CAPSULE BY MOUTH DAILY  IN THE MORNING HALF HOUR BEFORE BREAKFAST AND OTHER MEDS   ondansetron (ZOFRAN) 4 MG tablet Take 1 tablet (4 mg total) by mouth every 8 (eight) hours as needed for nausea or vomiting.   prazosin (MINIPRESS) 1 MG capsule Take 1 mg by mouth at bedtime.   traZODone (DESYREL) 100 MG tablet Take 400 mg by mouth at bedtime.   valbenazine (INGREZZA) 80 MG capsule Take 80 mg by mouth daily.   Allergies  Allergen Reactions   Chocolate Hives   Penicillins Hives and Other (See Comments)    Has patient had a PCN reaction causing immediate rash, facial/tongue/throat swelling, SOB or lightheadedness with hypotension: Yes Has patient had a PCN reaction causing severe rash involving mucus membranes or skin necrosis: Yes Has patient had a PCN reaction that required hospitalization No Has patient had a PCN reaction occurring within the last 10 years: No If all of the above answers are "NO", then may proceed with Cephalosporin use.    Orange Hives   Strawberry Extract Swelling   Tomato Hives and Other (See Comments)    "acid foods"     Review of Systems  Respiratory:  Negative for shortness of breath.   Cardiovascular:  Negative for chest pain, palpitations and leg swelling.  Neurological:  Negative for weakness and numbness.      Objective:   BP (!) 156/100 (BP Location: Left Arm, Patient Position: Sitting, Cuff Size: Normal)   Pulse 60   Resp 16   Ht 5\' 4"  (1.626 m)   Wt 221 lb (100.2 kg)   BMI 37.93 kg/m   Physical Exam NAD HEENT:  PERRL, EOMI, TMs pearly gray, throat without injection, mild irritation to tongue/roof of mouth. Neck:  Supple, No adenopathy Chest;  CTA CV:  RRR without murmur or rub. No carotid bruit.  Carotid, radial and Dp pulses normal and equal. Abd:  S, NT, No HSM or mass, + BS LE:  No edema Neuro:  Resting tremor in bilateral hands and right leg.  She is left handed.   Assessment & Plan   DM:  A1C as she states she has made improvements in diet.  Add  Jardiance 10 mg daily.  Continuous glucose monitoring equipment ordered.  2.  Hyperlipidemia:  Restart Atorvastatin.  Labs in 3 months.  3.  Hypertension:  not at goal and needs to improve for surgery as well.  Add Amlodipine back.  BP and pulse in 2 weeks.  4.  Bipolar Disorder:  as per psych.  She does have a tremor noted today.  5.  Aphthous Ulcer:  Magic Mouthwash.

## 2022-10-14 LAB — HGB A1C W/O EAG: Hgb A1c MFr Bld: 7.2 % — ABNORMAL HIGH (ref 4.8–5.6)

## 2022-10-20 ENCOUNTER — Other Ambulatory Visit: Payer: Self-pay | Admitting: Internal Medicine

## 2022-10-28 ENCOUNTER — Ambulatory Visit (INDEPENDENT_AMBULATORY_CARE_PROVIDER_SITE_OTHER): Payer: Medicaid Other

## 2022-10-28 VITALS — BP 138/88 | HR 72

## 2022-10-28 DIAGNOSIS — Z0130 Encounter for examination of blood pressure without abnormal findings: Secondary | ICD-10-CM | POA: Diagnosis not present

## 2022-11-12 ENCOUNTER — Encounter (HOSPITAL_COMMUNITY): Payer: Self-pay

## 2022-11-12 ENCOUNTER — Ambulatory Visit
Admission: RE | Admit: 2022-11-12 | Discharge: 2022-11-12 | Disposition: A | Discharge status: Home / Self Care | Payer: Medicaid Other | Source: Ambulatory Visit | Attending: Internal Medicine | Admitting: Internal Medicine

## 2022-11-12 DIAGNOSIS — Z1231 Encounter for screening mammogram for malignant neoplasm of breast: Secondary | ICD-10-CM

## 2022-11-18 ENCOUNTER — Other Ambulatory Visit: Payer: Self-pay | Admitting: Internal Medicine

## 2022-11-21 NOTE — Progress Notes (Signed)
Surgery orders requested via Epic inbox. °

## 2022-11-23 DIAGNOSIS — E118 Type 2 diabetes mellitus with unspecified complications: Secondary | ICD-10-CM | POA: Insufficient documentation

## 2022-11-24 ENCOUNTER — Ambulatory Visit: Payer: Self-pay | Admitting: Student

## 2022-11-24 DIAGNOSIS — E119 Type 2 diabetes mellitus without complications: Secondary | ICD-10-CM

## 2022-11-25 NOTE — Patient Instructions (Signed)
SURGICAL WAITING ROOM VISITATION  Patients having surgery or a procedure may have no more than 2 support people in the waiting area - these visitors may rotate.    Children under the age of 33 must have an adult with them who is not the patient.  Due to an increase in RSV and influenza rates and associated hospitalizations, children ages 39 and under may not visit patients in [facility] hospitals.  If the patient needs to stay at the hospital during part of their recovery, the visitor guidelines for inpatient rooms apply. Pre-op nurse will coordinate an appropriate time for 1 support person to accompany patient in pre-op.  This support person may not rotate.    Please refer to the [facility] website for the visitor guidelines for Inpatients (after your surgery is over and you are in a regular room).       Your procedure is scheduled on: 12/11/22   Report to [facility] Main Entrance    Report to admitting at 11:40 AM   Call this number if you have problems the morning of surgery [phone]   Do not eat food :After Midnight.   After Midnight you may have the following liquids until 11:10 AM DAY OF SURGERY  Water Non-Citrus Juices (without pulp, NO RED-Apple, White grape, White cranberry) Black Coffee (NO MILK/CREAM OR CREAMERS, sugar ok)  Clear Tea (NO MILK/CREAM OR CREAMERS, sugar ok) regular and decaf                             Plain Jell-O (NO RED)                                           Fruit ices (not with fruit pulp, NO RED)                                     Popsicles (NO RED)                                                               Sports drinks like Gatorade (NO RED)                   The day of surgery:  Drink ONE (1) Pre-Surgery  G2 at 11:10 AM the morning of surgery. Drink in one sitting. Do not sip.  This drink was given to you during your hospital  pre-op appointment visit. Nothing else to drink after completing the  Pre-Surgery G2.       Oral Hygiene is also important to reduce your risk of infection.                                    Remember - BRUSH YOUR TEETH THE MORNING OF SURGERY WITH YOUR REGULAR TOOTHPASTE  DENTURES WILL BE REMOVED PRIOR TO SURGERY PLEASE DO NOT APPLY "Poly grip" OR ADHESIVES!!!   Do NOT smoke after Midnight   Take these medicines the morning of surgery :  Amlodipine(Norvasc), Certirizine(Zyrtec), Gabapentin(Neurontin), Prilosec (Omeprazole), Ingrezza(Valbenazine)  DO NOT TAKE ANY ORAL DIABETIC MEDICATIONS DAY OF YOUR SURGERY  Bring CPAP mask and tubing day of surgery.                              You may not have any metal on your body including hair pins, jewelry, and body piercing             Do not wear make-up, lotions, powders, perfumes/cologne, or deodorant  Do not wear nail polish including gel and S&S, artificial/acrylic nails, or any other type of covering on natural nails including finger and toenails. If you have artificial nails, gel coating, etc. that needs to be removed by a nail salon please have this removed prior to surgery or surgery may need to be canceled/ delayed if the surgeon/ anesthesia feels like they are unable to be safely monitored.   Do not shave  48 hours prior to surgery.    Do not bring valuables to the hospital. [facility] IS NOT             RESPONSIBLE   FOR VALUABLES.   Contacts, glasses, dentures or bridgework may not be worn into surgery.   Bring small overnight bag day of surgery.   DO NOT BRING YOUR HOME MEDICATIONS TO THE HOSPITAL. PHARMACY WILL DISPENSE MEDICATIONS LISTED ON YOUR MEDICATION LIST TO YOU DURING YOUR ADMISSION IN THE HOSPITAL!    Patients discharged on the day of surgery will not be allowed to drive home.  Someone NEEDS to stay with you for the first 24 hours after anesthesia.   Special Instructions: Bring a copy of your healthcare power of attorney and living will documents the day of surgery if you haven't scanned them before.               Please read over the following fact sheets you were given: IF YOU HAVE QUESTIONS ABOUT YOUR PRE-OP INSTRUCTIONS PLEASE CALL [phone]   If you received a COVID test during your pre-op visit  it is requested that you wear a mask when out in public, stay away from anyone that may not be feeling well and notify your surgeon if you develop symptoms. If you test positive for Covid or have been in contact with anyone that has tested positive in the last 10 days please notify you surgeon.   Pre-operative 5 CHG Bath Instructions   You can play a key role in reducing the risk of infection after surgery. Your skin needs to be as free of germs as possible. You can reduce the number of germs on your skin by washing with CHG (chlorhexidine gluconate) soap before surgery. CHG is an antiseptic soap that kills germs and continues to kill germs even after washing.   DO NOT use if you have an allergy to chlorhexidine/CHG or antibacterial soaps. If your skin becomes reddened or irritated, stop using the CHG and notify one of our RNs at [phone].   Please shower with the CHG soap starting 4 days before surgery using the following schedule:     Please keep in mind the following:  DO NOT shave, including legs and underarms, starting the day of your first shower.   You may shave your face at any point before/day of surgery.  Place clean sheets on your bed the day you start using CHG soap. Use a clean washcloth (not used since being washed) for each  shower. DO NOT sleep with pets once you start using the CHG.   CHG Shower Instructions:  If you choose to wash your hair and private area, wash first with your normal shampoo/soap.  After you use shampoo/soap, rinse your hair and body thoroughly to remove shampoo/soap residue.  Turn the water OFF and apply about 3 tablespoons (45 ml) of CHG soap to a CLEAN washcloth.  Apply CHG soap ONLY FROM YOUR NECK DOWN TO YOUR TOES (washing for 3-5 minutes)  DO  NOT use CHG soap on face, private areas, open wounds, or sores.  Pay special attention to the area where your surgery is being performed.  If you are having back surgery, having someone wash your back for you may be helpful. Wait 2 minutes after CHG soap is applied, then you may rinse off the CHG soap.  Pat dry with a clean towel  Put on clean clothes/pajamas   If you choose to wear lotion, please use ONLY the CHG-compatible lotions on the back of this paper.     Additional instructions for the day of surgery: DO NOT APPLY any lotions, deodorants, cologne, or perfumes.   Put on clean/comfortable clothes.  Brush your teeth.  Ask your nurse before applying any prescription medications to the skin.      CHG Compatible Lotions   Aveeno Moisturizing lotion  Cetaphil Moisturizing Cream  Cetaphil Moisturizing Lotion  Clairol Herbal Essence Moisturizing Lotion, Dry Skin  Clairol Herbal Essence Moisturizing Lotion, Extra Dry Skin  Clairol Herbal Essence Moisturizing Lotion, Normal Skin  Curel Age Defying Therapeutic Moisturizing Lotion with Alpha Hydroxy  Curel Extreme Care Body Lotion  Curel Soothing Hands Moisturizing Hand Lotion  Curel Therapeutic Moisturizing Cream, Fragrance-Free  Curel Therapeutic Moisturizing Lotion, Fragrance-Free  Curel Therapeutic Moisturizing Lotion, Original Formula  Eucerin Daily Replenishing Lotion  Eucerin Dry Skin Therapy Plus Alpha Hydroxy Crme  Eucerin Dry Skin Therapy Plus Alpha Hydroxy Lotion  Eucerin Original Crme  Eucerin Original Lotion  Eucerin Plus Crme Eucerin Plus Lotion  Eucerin TriLipid Replenishing Lotion  Keri Anti-Bacterial Hand Lotion  Keri Deep Conditioning Original Lotion Dry Skin Formula Softly Scented  Keri Deep Conditioning Original Lotion, Fragrance Free Sensitive Skin Formula  Keri Lotion Fast Absorbing Fragrance Free Sensitive Skin Formula  Keri Lotion Fast Absorbing Softly Scented Dry Skin Formula  Keri Original Lotion   Keri Skin Renewal Lotion Keri Silky Smooth Lotion  Keri Silky Smooth Sensitive Skin Lotion  Nivea Body Creamy Conditioning Oil  Nivea Body Extra Enriched Lotion  Nivea Body Original Lotion  Nivea Body Sheer Moisturizing Lotion Nivea Crme  Nivea Skin Firming Lotion  NutraDerm 30 Skin Lotion  NutraDerm Skin Lotion  NutraDerm Therapeutic Skin Cream  NutraDerm Therapeutic Skin Lotion  ProShield Protective Hand Cream      How to Manage Your Diabetes Before and After Surgery  Why is it important to control my blood sugar before and after surgery? Improving blood sugar levels before and after surgery helps healing and can limit problems. A way of improving blood sugar control is eating a healthy diet by:  Eating less sugar and carbohydrates  Increasing activity/exercise  Talking with your doctor about reaching your blood sugar goals High blood sugars (greater than 180 mg/dL) can raise your risk of infections and slow your recovery, so you will need to focus on controlling your diabetes during the weeks before surgery. Make sure that the doctor who takes care of your diabetes knows about your planned surgery including the date and location.  How do I manage my blood sugar before surgery? Check your blood sugar at least 4 times a day, starting 2 days before surgery, to make sure that the level is not too high or low. Check your blood sugar the morning of your surgery when you wake up and every 2 hours until you get to the Short Stay unit. If your blood sugar is less than 70 mg/dL, you will need to treat for low blood sugar: Do not take insulin. Treat a low blood sugar (less than 70 mg/dL) with  cup of clear juice (cranberry or apple), 4 glucose tablets, OR glucose gel. Recheck blood sugar in 15 minutes after treatment (to make sure it is greater than 70 mg/dL). If your blood sugar is not greater than 70 mg/dL on recheck, call [phone] for further instructions. Report your blood sugar to  the short stay nurse when you get to Short Stay.  If you are admitted to the hospital after surgery: Your blood sugar will be checked by the staff and you will probably be given insulin after surgery (instead of oral diabetes medicines) to make sure you have good blood sugar levels. The goal for blood sugar control after surgery is 80-180 mg/dL.   WHAT DO I DO ABOUT MY DIABETES MEDICATION?  Do not take oral diabetes medicines (pills) the morning of surgery.  THE NIGHT BEFORE SURGERY, take     units of       insulin.       THE MORNING OF SURGERY, take   units of         insulin.  DO NOT TAKE THE FOLLOWING 7 DAYS PRIOR TO SURGERY: Ozempic, Wegovy, Rybelsus (Semaglutide), Byetta (exenatide), Bydureon (exenatide ER), Victoza, Saxenda (liraglutide), or Trulicity (dulaglutide) Mounjaro (Tirzepatide) Adlyxin (Lixisenatide), Polyethylene Glycol Loxenatide.  If your CBG is greater than 220 mg/dL, you may take  of your sliding scale  (correction) dose of insulin.    For patients with insulin pumps: Contact your diabetes doctor for specific instructions before surgery. Decrease basal rates by 20% at midnight the night before your surgery. Note that if your surgery is planned to be longer than 2 hours, your insulin pump will be removed and intravenous (IV) insulin will be started and managed by the nurses and the anesthesiologist. You will be able to restart your insulin pump once you are awake and able to manage it.  Make sure to bring insulin pump supplies to the hospital with you in case the  site needs to be changed.  Patient Signature:  Date:   Nurse Signature:  Date:   Reviewed and Endorsed by [facility] Patient Education Committee, August 2015

## 2022-11-25 NOTE — Progress Notes (Addendum)
COVID Vaccine received:  []  No [x]  Yes Date of any COVID positive Test in last 90 days:  PCP - Dr. Julieanne Manson Cardiologist - Dr. Nicki Guadalajara  Chest x-ray - 06/23/21 EPIC EKG -  09/13/22  EPIC Stress Test - 10/31/08 EPIC ECHO - 01/01/22 EPIC Cardiac Cath - No   Cardiac Clearance 09/08/22 Eligha Bridegroom NP-C  Bowel Prep - [x]  No  []   Yes ______  Pacemaker / ICD device [x]  No []  Yes   Spinal Cord Stimulator:[x]  No []  Yes       History of Sleep Apnea? []  No [x]  Yes   CPAP used?- [x]  No []  Yes  Pt. Reports she lost the pieces to it during moving.  Does the patient monitor blood sugar?          [x]  No []  Yes  []  N/A  Patient has: []  NO Hx DM   []  Pre-DM                 []  DM1  [x]   DM2 Does patient have a [employer] or Dexacom? [x]  No []  Yes   Fasting Blood Sugar Ranges-  Checks Blood Sugar __2___ times a day  GLP1 agonist / usual dose - No GLP1 instructions:  SGLT-2 inhibitors / usual dose - No SGLT-2 instructions:   Blood Thinner / Instructions:No Aspirin Instructions:81 mg ASA. Instructed pt. To hold ASA for 5 days prior to surgery. She verbalized understanding.  Comments:   Activity level: Patient is able  to climb a flight of stairs without difficulty; [x]  No CP  []  No SOB, Would have SOB   Patient can  perform ADLs without assistance.   Anesthesia review: HTN,CHF, OSA- doesn't use CPAP, Stroke, DM2, Lives in [place] [facility])  Patient denies shortness of breath, fever, cough and chest pain at PAT appointment.  Patient verbalized understanding and agreement to the Pre-Surgical Instructions that were given to them at this PAT appointment. Patient was also educated of the need to review these PAT instructions again prior to his/her surgery.I reviewed the appropriate phone numbers to call if they have any and questions or concerns.

## 2022-11-28 ENCOUNTER — Other Ambulatory Visit: Payer: Self-pay

## 2022-11-28 ENCOUNTER — Encounter (HOSPITAL_COMMUNITY)
Admission: RE | Admit: 2022-11-28 | Discharge: 2022-11-28 | Disposition: A | Discharge status: Home / Self Care | Payer: Medicaid Other | Source: Ambulatory Visit | Attending: Orthopedic Surgery | Admitting: Orthopedic Surgery

## 2022-11-28 ENCOUNTER — Encounter (HOSPITAL_COMMUNITY): Payer: Self-pay

## 2022-11-28 VITALS — BP 150/90 | HR 67 | Temp 97.3°F | Resp 16 | Ht 64.0 in | Wt 223.0 lb

## 2022-11-28 DIAGNOSIS — E119 Type 2 diabetes mellitus without complications: Secondary | ICD-10-CM | POA: Diagnosis not present

## 2022-11-28 DIAGNOSIS — I10 Essential (primary) hypertension: Secondary | ICD-10-CM | POA: Diagnosis not present

## 2022-11-28 DIAGNOSIS — Z794 Long term (current) use of insulin: Secondary | ICD-10-CM | POA: Diagnosis not present

## 2022-11-28 DIAGNOSIS — Z01812 Encounter for preprocedural laboratory examination: Secondary | ICD-10-CM | POA: Insufficient documentation

## 2022-11-28 DIAGNOSIS — Z01818 Encounter for other preprocedural examination: Secondary | ICD-10-CM

## 2022-11-28 LAB — CBC
HCT: 39.9 % (ref 36.0–46.0)
Hemoglobin: 12.1 g/dL (ref 12.0–15.0)
MCH: 28.4 pg (ref 26.0–34.0)
MCHC: 30.3 g/dL (ref 30.0–36.0)
MCV: 93.7 fL (ref 80.0–100.0)
Platelets: 223 10*3/uL (ref 150–400)
RBC: 4.26 MIL/uL (ref 3.87–5.11)
RDW: 12.2 % (ref 11.5–15.5)
WBC: 7.6 10*3/uL (ref 4.0–10.5)
nRBC: 0 % (ref 0.0–0.2)

## 2022-11-28 LAB — BASIC METABOLIC PANEL
Anion gap: 11 (ref 5–15)
BUN: 15 mg/dL (ref 6–20)
CO2: 24 mmol/L (ref 22–32)
Calcium: 9.5 mg/dL (ref 8.9–10.3)
Chloride: 101 mmol/L (ref 98–111)
Creatinine, Ser: 1.01 mg/dL — ABNORMAL HIGH (ref 0.44–1.00)
GFR, Estimated: >60 mL/min (ref >60)
Glucose, Bld: 138 mg/dL — ABNORMAL HIGH (ref 70–99)
Potassium: 3.9 mmol/L (ref 3.5–5.1)
Sodium: 136 mmol/L (ref 135–145)

## 2022-11-28 LAB — HEMOGLOBIN A1C
Hgb A1c MFr Bld: 6.5 % — ABNORMAL HIGH (ref 4.8–5.6)
Mean Plasma Glucose: 139.85 mg/dL

## 2022-11-28 LAB — GLUCOSE, CAPILLARY: Glucose-Capillary: 149 mg/dL — ABNORMAL HIGH (ref 70–99)

## 2022-11-28 LAB — SURGICAL PCR SCREEN
MRSA, PCR: NEGATIVE
Staphylococcus aureus: NEGATIVE

## 2022-11-28 NOTE — Patient Instructions (Addendum)
SURGICAL WAITING ROOM VISITATION  Patients having surgery or a procedure may have no more than 2 support people in the waiting area - these visitors may rotate.    Children under the age of 35 must have an adult with them who is not the patient.  Due to an increase in RSV and influenza rates and associated hospitalizations, children ages 48 and under may not visit patients in [facility] hospitals.  If the patient needs to stay at the hospital during part of their recovery, the visitor guidelines for inpatient rooms apply. Pre-op nurse will coordinate an appropriate time for 1 support person to accompany patient in pre-op.  This support person may not rotate.    Please refer to the [facility] website for the visitor guidelines for Inpatients (after your surgery is over and you are in a regular room).       Your procedure is scheduled on: 12/11/22   Report to [facility] Main Entrance    Report to admitting at 11:40 AM   Call this number if you have problems the morning of surgery [phone]   Do not eat food :After Midnight.   After Midnight you may have the following liquids until 11:10 AM  DAY OF SURGERY  Water Non-Citrus Juices (without pulp, NO RED-Apple, White grape, White cranberry) Black Coffee (NO MILK/CREAM OR CREAMERS, sugar ok)  Clear Tea (NO MILK/CREAM OR CREAMERS, sugar ok) regular and decaf                             Plain Jell-O (NO RED)                                           Fruit ices (not with fruit pulp, NO RED)                                     Popsicles (NO RED)                                                               Sports drinks like Gatorade (NO RED)                     The day of surgery:  Drink ONE (1) Pre-Surgery   G2 at 11:10 AM the morning of surgery. Drink in one sitting. Do not sip.  This drink was given to you during your hospital  pre-op appointment visit. Nothing else to drink after completing the  Pre-Surgery   G2.           If you have questions, please contact your surgeon's office.      Oral Hygiene is also important to reduce your risk of infection.                                    Remember - BRUSH YOUR TEETH THE MORNING OF SURGERY WITH YOUR REGULAR TOOTHPASTE  DENTURES WILL BE REMOVED PRIOR TO SURGERY PLEASE  DO NOT APPLY "Poly grip" OR ADHESIVES!!!   Do NOT smoke after Midnight   Take these medicines the morning of surgery with A SIP OF WATER:  Amlodipine, Certirizine, Gabapentin, Prilosec, Ingrezza, Lamotrigine, Atenolol Atorvastatin  Take Metformin the day before surgery.   DO NOT TAKE ANY ORAL DIABETIC MEDICATIONS DAY OF YOUR SURGERY  Bring CPAP mask and tubing day of surgery.                              You may not have any metal on your body including hair pins, jewelry, and body piercing             Do not wear make-up, lotions, powders, perfumese, or deodorant  Do not wear nail polish including gel and S&S, artificial/acrylic nails, or any other type of covering on natural nails including finger and toenails. If you have artificial nails, gel coating, etc. that needs to be removed by a nail salon please have this removed prior to surgery or surgery may need to be canceled/ delayed if the surgeon/ anesthesia feels like they are unable to be safely monitored.   Do not shave  48 hours prior to surgery.    Do not bring valuables to the hospital. [place] IS NOT             RESPONSIBLE   FOR VALUABLES.   Contacts, glasses, dentures or bridgework may not be worn into surgery.   Bring small overnight bag day of surgery.   DO NOT BRING YOUR HOME MEDICATIONS TO THE HOSPITAL. PHARMACY WILL DISPENSE MEDICATIONS LISTED ON YOUR MEDICATION LIST TO YOU DURING YOUR ADMISSION IN THE HOSPITAL!    Patients discharged on the day of surgery will not be allowed to drive home.  Someone NEEDS to stay with you for the first 24 hours after anesthesia.   Special Instructions: Bring a copy of your  healthcare power of attorney and living will documents the day of surgery if you haven't scanned them before.              Please read over the following fact sheets you were given: IF YOU HAVE QUESTIONS ABOUT YOUR PRE-OP INSTRUCTIONS PLEASE CALL [phone]  If you received a COVID test during your pre-op visit  it is requested that you wear a mask when out in public, stay away from anyone that may not be feeling well and notify your surgeon if you develop symptoms. If you test positive for Covid or have been in contact with anyone that has tested positive in the last 10 days please notify you surgeon.      Pre-operative 5 CHG Bath Instructions   You can play a key role in reducing the risk of infection after surgery. Your skin needs to be as free of germs as possible. You can reduce the number of germs on your skin by washing with CHG (chlorhexidine gluconate) soap before surgery. CHG is an antiseptic soap that kills germs and continues to kill germs even after washing.   DO NOT use if you have an allergy to chlorhexidine/CHG or antibacterial soaps. If your skin becomes reddened or irritated, stop using the CHG and notify one of our RNs at [phone].   Please shower with the CHG soap starting 4 days before surgery using the following schedule:     Please keep in mind the following:  DO NOT shave, including legs and underarms, starting the day of  your first shower.   You may shave your face at any point before/day of surgery.  Place clean sheets on your bed the day you start using CHG soap. Use a clean washcloth (not used since being washed) for each shower. DO NOT sleep with pets once you start using the CHG.   CHG Shower Instructions:  If you choose to wash your hair and private area, wash first with your normal shampoo/soap.  After you use shampoo/soap, rinse your hair and body thoroughly to remove shampoo/soap residue.  Turn the water OFF and apply about 3 tablespoons (45 ml) of  CHG soap to a CLEAN washcloth.  Apply CHG soap ONLY FROM YOUR NECK DOWN TO YOUR TOES (washing for 3-5 minutes)  DO NOT use CHG soap on face, private areas, open wounds, or sores.  Pay special attention to the area where your surgery is being performed.  If you are having back surgery, having someone wash your back for you may be helpful. Wait 2 minutes after CHG soap is applied, then you may rinse off the CHG soap.  Pat dry with a clean towel  Put on clean clothes/pajamas   If you choose to wear lotion, please use ONLY the CHG-compatible lotions on the back of this paper.     Additional instructions for the day of surgery: DO NOT APPLY any lotions, deodorants, cologne, or perfumes.   Put on clean/comfortable clothes.  Brush your teeth.  Ask your nurse before applying any prescription medications to the skin.      CHG Compatible Lotions   Aveeno Moisturizing lotion  Cetaphil Moisturizing Cream  Cetaphil Moisturizing Lotion  Clairol Herbal Essence Moisturizing Lotion, Dry Skin  Clairol Herbal Essence Moisturizing Lotion, Extra Dry Skin  Clairol Herbal Essence Moisturizing Lotion, Normal Skin  Curel Age Defying Therapeutic Moisturizing Lotion with Alpha Hydroxy  Curel Extreme Care Body Lotion  Curel Soothing Hands Moisturizing Hand Lotion  Curel Therapeutic Moisturizing Cream, Fragrance-Free  Curel Therapeutic Moisturizing Lotion, Fragrance-Free  Curel Therapeutic Moisturizing Lotion, Original Formula  Eucerin Daily Replenishing Lotion  Eucerin Dry Skin Therapy Plus Alpha Hydroxy Crme  Eucerin Dry Skin Therapy Plus Alpha Hydroxy Lotion  Eucerin Original Crme  Eucerin Original Lotion  Eucerin Plus Crme Eucerin Plus Lotion  Eucerin TriLipid Replenishing Lotion  Keri Anti-Bacterial Hand Lotion  Keri Deep Conditioning Original Lotion Dry Skin Formula Softly Scented  Keri Deep Conditioning Original Lotion, Fragrance Free Sensitive Skin Formula  Keri Lotion Fast Absorbing  Fragrance Free Sensitive Skin Formula  Keri Lotion Fast Absorbing Softly Scented Dry Skin Formula  Keri Original Lotion  Keri Skin Renewal Lotion Keri Silky Smooth Lotion  Keri Silky Smooth Sensitive Skin Lotion  Nivea Body Creamy Conditioning Oil  Nivea Body Extra Enriched Lotion  Nivea Body Original Lotion  Nivea Body Sheer Moisturizing Lotion Nivea Crme  Nivea Skin Firming Lotion  NutraDerm 30 Skin Lotion  NutraDerm Skin Lotion  NutraDerm Therapeutic Skin Cream  NutraDerm Therapeutic Skin Lotion  ProShield Protective Hand Cream  Provon moisturizing lotion How to Manage Your Diabetes Before and After Surgery  Why is it important to control my blood sugar before and after surgery? Improving blood sugar levels before and after surgery helps healing and can limit problems. A way of improving blood sugar control is eating a healthy diet by:  Eating less sugar and carbohydrates  Increasing activity/exercise  Talking with your doctor about reaching your blood sugar goals High blood sugars (greater than 180 mg/dL) can raise your risk of infections  and slow your recovery, so you will need to focus on controlling your diabetes during the weeks before surgery. Make sure that the doctor who takes care of your diabetes knows about your planned surgery including the date and location.  How do I manage my blood sugar before surgery? Check your blood sugar at least 4 times a day, starting 2 days before surgery, to make sure that the level is not too high or low. Check your blood sugar the morning of your surgery when you wake up and every 2 hours until you get to the Short Stay unit. If your blood sugar is less than 70 mg/dL, you will need to treat for low blood sugar: Do not take insulin. Treat a low blood sugar (less than 70 mg/dL) with  cup of clear juice (cranberry or apple), 4 glucose tablets, OR glucose gel. Recheck blood sugar in 15 minutes after treatment (to make sure it is greater  than 70 mg/dL). If your blood sugar is not greater than 70 mg/dL on recheck, call [phone] for further instructions. Report your blood sugar to the short stay nurse when you get to Short Stay.  If you are admitted to the hospital after surgery: Your blood sugar will be checked by the staff and you will probably be given insulin after surgery (instead of oral diabetes medicines) to make sure you have good blood sugar levels. The goal for blood sugar control after surgery is 80-180 mg/dL.   WHAT DO I DO ABOUT MY DIABETES MEDICATION?  Do not take oral diabetes medicines (pills) the morning of surgery. . DO not take Jardiance 72 hours before surgery. Last dose should be June 23   Patient Signature:  Date:   Nurse Signature:  Date:   Reviewed and Endorsed by [facility] Patient Education Committee, August 2015

## 2022-12-02 ENCOUNTER — Ambulatory Visit: Payer: Self-pay | Admitting: Student

## 2022-12-02 NOTE — Progress Notes (Signed)
Anesthesia Chart Review   Case: [id number] Date/Time: 12/11/22 1044   Procedure: COMPUTER ASSISTED TOTAL KNEE ARTHROPLASTY (Right: Knee)   Anesthesia type: Spinal   Pre-op diagnosis: Right knee osteoarthritis   Location: [facility] ROOM 08 / [facility]   Surgeons: Samson Frederic, MD       DISCUSSION:57 y.o. former smoker with h/o HTN, GERD, bipolar disorder 1, sleep apnea, CHF, stroke, DM II (A1C 6.5), substance abuse in remission, right knee OA scheduled for above procedure 12/11/2022 with Dr. Samson Frederic.   Per cardiology note 09/08/2022, "Preoperative Cardiovascular Risk Assessment: According to the Revised Cardiac Risk Index (RCRI), her Perioperative Risk of Major Cardiac Event is (%): 6.6. Her Functional Capacity in METs is: 6.05 according to the Duke Activity Status Index (DASI). The patient is doing well from a cardiac perspective. Therefore, based on ACC/AHA guidelines, the patient would be at acceptable risk for the planned procedure without further cardiovascular testing.    The patient was advised that if she develops new symptoms prior to surgery to contact our office to arrange for a follow-up visit, and she verbalized understanding."  Anticipate pt can proceed with planned procedure barring acute status change.   VS: BP (!) 150/90   Pulse 67   Temp (!) 36.3 C (Oral)   Resp 16   Ht 5\' 4"  (1.626 m)   Wt 101.2 kg   SpO2 97%   BMI 38.28 kg/m   PROVIDERS: Julieanne Manson, MD is PCP   Cardiologist - Dr. Nicki Guadalajara  LABS: Labs reviewed: Acceptable for surgery. (all labs ordered are listed, but only abnormal results are displayed)  Labs Reviewed  HEMOGLOBIN A1C - Abnormal; Notable for the following components:      Result Value   Hgb A1c MFr Bld 6.5 (*)    All other components within normal limits  BASIC METABOLIC PANEL - Abnormal; Notable for the following components:   Glucose, Bld 138 (*)    Creatinine, Ser 1.01 (*)    All other components within normal limits   GLUCOSE, CAPILLARY - Abnormal; Notable for the following components:   Glucose-Capillary 149 (*)    All other components within normal limits  SURGICAL PCR SCREEN  CBC     IMAGES:   EKG:   CV: Echo 01/01/2022 1. Left ventricular ejection fraction, by estimation, is 60 to 65%. The  left ventricle has normal function. The left ventricle has no regional  wall motion abnormalities. There is moderate left ventricular hypertrophy.  Left ventricular diastolic  parameters are consistent with Grade I diastolic dysfunction (impaired  relaxation).   2. Right ventricular systolic function is normal. The right ventricular  size is normal. Tricuspid regurgitation signal is inadequate for assessing  PA pressure.   3. The mitral valve is normal in structure. No evidence of mitral valve  regurgitation.   4. The aortic valve is tricuspid. Aortic valve regurgitation is not  visualized.   5. The inferior vena cava is normal in size with greater than 50%  respiratory variability, suggesting right atrial pressure of 3 mmHg.   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 x 12 yrs.   Sleep apnea    Stroke (HCC)    1995   Mild. no residual problems   Substance abuse (HCC)    crack cocaine:  extensive treatment with [facility] [place] from 2014 to 2018 for this and alcohol abuse.   TMJ (temporomandibular joint disorder)     Past Surgical History:  Procedure Laterality Date   BREAST EXCISIONAL BIOPSY Right    BREAST LUMPECTOMY WITH NEEDLE  LOCALIZATION  04/19/2012   Procedure: BREAST LUMPECTOMY WITH NEEDLE LOCALIZATION;  Surgeon: Robyne Askew, MD;  Location: [facility];  Service: General;  Laterality: Right;   FOOT SURGERY Left 2022   [facility]   KNEE ARTHROPLASTY Left 02/19/2022   Procedure: COMPUTER ASSISTED TOTAL KNEE ARTHROPLASTY;  Surgeon: Samson Frederic, MD;  Location: [facility];  Service: Orthopedics;  Laterality: Left;   KNEE ARTHROSCOPY W/ PARTIAL MEDIAL MENISCECTOMY  05/01/2010   right    MEDICATIONS:  ABILIFY MAINTENA 400 MG PRSY prefilled syringe   Accu-Chek Softclix Lancets lancets   acetaminophen (TYLENOL) 500 MG tablet   amLODipine (NORVASC) 10 MG tablet   atenolol (TENORMIN) 100 MG tablet   atorvastatin (LIPITOR) 40 MG tablet   Blood Glucose Monitoring Suppl (ACCU-CHEK AVIVA PLUS) w/Device KIT   Blood Pressure Monitoring (BLOOD PRESSURE KIT) DEVI   cetirizine (ZYRTEC) 10 MG tablet   Continuous Glucose Sensor (DEXCOM G7 SENSOR) MISC   cyclobenzaprine (FLEXERIL) 5 MG tablet   diclofenac Sodium (VOLTAREN) 1 % GEL   empagliflozin (JARDIANCE) 10 MG TABS tablet   fluticasone-salmeterol (ADVAIR) 100-50 MCG/ACT AEPB   gabapentin (NEURONTIN) 300 MG capsule   glucose blood (ACCU-CHEK AVIVA PLUS) test strip   hydrochlorothiazide (HYDRODIURIL) 25 MG tablet   lamoTRIgine (LAMICTAL) 100 MG tablet   losartan (COZAAR) 100 MG tablet   magic mouthwash (nystatin, lidocaine, diphenhydrAMINE, alum & mag hydroxide) suspension   melatonin 3 MG TABS tablet   meloxicam (MOBIC) 15 MG tablet   metFORMIN (GLUCOPHAGE) 1000 MG tablet   Multiple Vitamin (MULTIVITAMIN WITH MINERALS) TABS tablet   omeprazole (PRILOSEC) 40 MG capsule   prazosin (MINIPRESS) 1 MG capsule   trazodone (DESYREL) 300 MG tablet   valbenazine (INGREZZA) 80 MG capsule   VENTOLIN HFA 108 (90 Base) MCG/ACT inhaler   No current facility-administered medications for this encounter.    Jodell Cipro Ward, PA-C [facility] Pre-Surgical  Testing [phone]

## 2022-12-02 NOTE — H&P (Signed)
TOTAL KNEE ADMISSION H&P  Patient is being admitted for right total knee arthroplasty.  Subjective:  Chief Complaint:right knee pain.  HPI: RHILYN Swanson, 57 y.o. female, has a history of pain and functional disability in the right knee due to arthritis and has failed non-surgical conservative treatments for greater than 12 weeks to includeNSAID's and/or analgesics, corticosteriod injections, flexibility and strengthening excercises, use of assistive devices, weight reduction as appropriate, and activity modification.  Onset of symptoms was gradual, starting >10 years ago with rapidlly worsening course since that time. The patient noted no past surgery on the right knee(s).  Patient currently rates pain in the right knee(s) at 10 out of 10 with activity. Patient has night pain, worsening of pain with activity and weight bearing, pain that interferes with activities of daily living, pain with passive range of motion, crepitus, and joint swelling.  Patient has evidence of subchondral cysts, subchondral sclerosis, periarticular osteophytes, and joint space narrowing by imaging studies. There is no active infection.  Patient Active Problem List   Diagnosis Date Noted   Type 2 diabetes mellitus with complication, with long-term current use of insulin (HCC) 11/23/2022   Hemorrhoids 04/02/2022   Aphthous ulcer of mouth 04/02/2022   Osteoarthritis of left knee 02/19/2022   OSA (obstructive sleep apnea) 12/31/2021   Acute gout of left foot 12/06/2019   CHF (congestive heart failure) (HCC) 08/03/2016   Cannabis use disorder, moderate, dependence (HCC) 11/20/2015   Homicidal ideation    Tobacco use disorder 07/20/2015   Diabetes mellitus (HCC) 07/20/2015   Cocaine use disorder, moderate, dependence (HCC) 04/03/2015   Schizoaffective disorder, bipolar type (HCC) 04/03/2015   Alcohol use disorder, moderate, dependence (HCC) 04/03/2015   Acute ischemic stroke (HCC) 12/08/2014   Gout attack 11/12/2014    Unilateral primary osteoarthritis, right knee 11/08/2014   Diabetic peripheral neuropathy (HCC) 11/04/2014   Mixed hyperlipidemia 11/04/2014   Small vessel disease, cerebrovascular 11/04/2014   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   Cocaine abuse (HCC) 02/19/2012   Knee pain 10/17/2010   MAMMOGRAM, ABNORMAL, RIGHT 08/05/2010   AMENORRHEA, SECONDARY 10/29/2009   HERPES ZOSTER 08/20/2009   Trichomonas vaginitis 01/26/2009   GERD 10/11/2007   Essential hypertension 02/26/2007   Past Medical History:  Diagnosis Date   Anxiety    Arthritis    back and knees   Asthma    daily and prn inhalers   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 x 12 yrs.   Sleep apnea    Stroke (HCC)    1995   Mild. no residual problems   Substance abuse (HCC)    crack cocaine:  extensive treatment with [facility] [place] from 2014 to 2018 for this and alcohol abuse.   TMJ (temporomandibular joint disorder)     Past Surgical History:  Procedure Laterality Date   BREAST EXCISIONAL BIOPSY Right    BREAST LUMPECTOMY WITH NEEDLE LOCALIZATION  04/19/2012   Procedure: BREAST LUMPECTOMY WITH NEEDLE LOCALIZATION;  Surgeon: Robyne Askew, MD;  Location: [facility];  Service: General;  Laterality: Right;   FOOT SURGERY Left 2022   [facility]   KNEE ARTHROPLASTY Left 02/19/2022   Procedure: COMPUTER ASSISTED TOTAL KNEE ARTHROPLASTY;  Surgeon: Samson Frederic, MD;  Location: [facility];  Service: Orthopedics;  Laterality: Left;   KNEE ARTHROSCOPY W/ PARTIAL MEDIAL MENISCECTOMY  05/01/2010    right    Current Outpatient Medications  Medication Sig Dispense Refill Last Dose   ABILIFY MAINTENA 400 MG PRSY prefilled syringe Inject 400 mg into the muscle every 30 (thirty) days. Last injection given on 01/10/2021. Next injection due 02/07/2021 1 each     Accu-Chek Softclix Lancets lancets Use as instructed 100 each 12    acetaminophen (TYLENOL) 500 MG tablet Take 1,000 mg by mouth every 6 (six) hours as needed for moderate pain.      amLODipine (NORVASC) 10 MG tablet Take 1 tablet (10 mg total) by mouth daily. 1 tab by mouth daily 30 tablet 11    atenolol (TENORMIN) 100 MG tablet TAKE ONE TABLET BY MOUTH DAILY 30 tablet 7    atorvastatin (LIPITOR) 40 MG tablet Take 1 tablet (40 mg total) by mouth at bedtime. 30 tablet 11    Blood Glucose Monitoring Suppl (ACCU-CHEK AVIVA PLUS) w/Device KIT Check blood glucose twice daily before meals 1 kit 0    Blood Pressure Monitoring (BLOOD PRESSURE KIT) DEVI Check blood pressure 3 times weekly 1 each 0    cetirizine (ZYRTEC) 10 MG tablet TAKE ONE TABLET BY MOUTH DAILY FOR ALLERGIES AS NEEDED (Patient taking differently: Take 10 mg by mouth daily.) 30 tablet 7    Continuous Glucose Sensor (DEXCOM G7 SENSOR) MISC Check glucose before each meal and at bedtime or if symptomatic 2 each 11    cyclobenzaprine (FLEXERIL) 5 MG tablet TAKE ONE TABLET BY MOUTH AT BEDTIME AS NEEDED (Patient taking differently: Take 5 mg by mouth at bedtime as needed for muscle spasms.) 30 tablet 0    diclofenac Sodium (VOLTAREN) 1 % GEL APPLY TWO GRAMS TO THE KNEES UP TO FOUR TIMES A DAY AS NEEDED FOR PAIN 100 g 3    empagliflozin (JARDIANCE) 10 MG TABS tablet Take 1 tablet (10 mg total) by mouth daily before breakfast. 30 tablet 11    fluticasone-salmeterol (ADVAIR) 100-50 MCG/ACT AEPB Inhale 1 puff into the lungs 2 (two) times daily. 1 each 11    gabapentin (NEURONTIN) 300 MG capsule Take 1 capsule (300 mg total) by mouth 3 (three) times daily. 90 capsule 11    glucose blood  (ACCU-CHEK AVIVA PLUS) test strip Check blood glucose twice daily before meals 100 each 12    hydrochlorothiazide (HYDRODIURIL) 25 MG tablet TAKE ONE TABLET BY MOUTH DAILY IN THE MORNING WITH BREAKFAST 30 tablet 11    lamoTRIgine (LAMICTAL) 100 MG tablet Take 100 mg by mouth daily.      losartan (COZAAR) 100 MG tablet TAKE ONE TABLET BY MOUTH DAILY IN THE MORNING 30 tablet 11    magic mouthwash (nystatin, lidocaine, diphenhydrAMINE, alum & mag hydroxide) suspension Swish and spit 5 mLs 4 (four) times daily. (Patient taking differently: Swish and spit 5 mLs 4 (four) times daily as needed for mouth pain.) 200 mL 0    melatonin 3 MG TABS tablet Take 1 tablet (3 mg total) by mouth at bedtime. 30 tablet 0    meloxicam (MOBIC) 15 MG tablet Take 1 tablet (15 mg total) by mouth daily with breakfast. (Patient not taking: Reported  on 11/25/2022) 30 tablet 2    metFORMIN (GLUCOPHAGE) 1000 MG tablet TAKE ONE TABLET BY MOUTH TWICE A DAY WITH MEAL 60 tablet 7    Multiple Vitamin (MULTIVITAMIN WITH MINERALS) TABS tablet Take 1 tablet by mouth daily.      omeprazole (PRILOSEC) 40 MG capsule TAKE ONE CAPSULE BY MOUTH DAILY IN THE MORNING HALF HOUR BEFORE BREAKFAST AND OTHER MEDS 30 capsule 11    prazosin (MINIPRESS) 1 MG capsule Take 1 mg by mouth at bedtime.      trazodone (DESYREL) 300 MG tablet Take 300 mg by mouth at bedtime.      valbenazine (INGREZZA) 80 MG capsule Take 80 mg by mouth at bedtime.      VENTOLIN HFA 108 (90 Base) MCG/ACT inhaler INHALE TWO PUFFS BY MOUTH EVERY SIX HOURS AS NEEDED FOR SHORTNESS OF BREATH 18 g 0    No current facility-administered medications for this visit.   Allergies  Allergen Reactions   Chocolate Hives   Penicillins Hives and Other (See Comments)        Orange Hives   Strawberry Extract Swelling   Tomato Hives and Other (See Comments)    "acid foods"    Social History   Tobacco Use   Smoking status: Former    Packs/day: 1.00    Years: 37.00    Additional pack  years: 0.00    Total pack years: 37.00    Types: Cigarettes    Quit date: 12/14/2020    Years since quitting: 1.9    Passive exposure: Past   Smokeless tobacco: Never  Substance Use Topics   Alcohol use: Not Currently    Comment: History of abuse.  Clean for 9 months dated 12/31/2021    Family History  Problem Relation Age of Onset   Diabetes Mother    Breast cancer Mother        She is not sure about this diagnosis   Cancer Father        Laryngeal   Multiple sclerosis Sister        not clear if this is her diagnosis   Bipolar disorder Maternal Aunt    Alcoholism Maternal Uncle    Schizophrenia Maternal Grandfather      Review of Systems  Musculoskeletal:  Positive for arthralgias, gait problem and joint swelling.  All other systems reviewed and are negative.   Objective:  Physical Exam Constitutional:      Appearance: Normal appearance.  HENT:     Head: Normocephalic and atraumatic.     Nose: Nose normal.     Mouth/Throat:     Mouth: Mucous membranes are moist.     Pharynx: Oropharynx is clear.  Eyes:     Conjunctiva/sclera: Conjunctivae normal.  Cardiovascular:     Rate and Rhythm: Normal rate and regular rhythm.     Pulses: Normal pulses.  Pulmonary:     Effort: Pulmonary effort is normal.     Breath sounds: Normal breath sounds.  Abdominal:     General: Abdomen is flat.     Palpations: Abdomen is soft.  Genitourinary:    Comments: deferred Musculoskeletal:     Cervical back: Normal range of motion and neck supple.     Comments: Examination of the right knee reveals healed arthroscopy portals. No skin wounds or lesions. Mild swelling, no effusion noted. Pain with palpation over the medial joint line, lateral joint line, periretinacular tissues, positive grind sign. Pain over the pes anserine insertion. Range of motion 0  to 117 degrees. No ligamentous instability noted. Painless hip range of motion.    Sensory and motor function intact in LE bilaterally.  Distal pedal pulses 2+ bilaterally.   No significant pedal edema. Calves soft and non-tender.   She is ambulating with no assist device today.    Skin:    General: Skin is warm and dry.     Capillary Refill: Capillary refill takes less than 2 seconds.  Neurological:     General: No focal deficit present.     Mental Status: She is alert and oriented to person, place, and time.  Psychiatric:        Mood and Affect: Mood normal.        Behavior: Behavior normal.        Thought Content: Thought content normal.        Judgment: Judgment normal.     Vital signs in last 24 hours: @VSRANGES @  Labs:   Estimated body mass index is 38.28 kg/m as calculated from the following:   Height as of 11/28/22: 5\' 4"  (1.626 m).   Weight as of 11/28/22: 101.2 kg.   Imaging Review Plain radiographs demonstrate severe degenerative joint disease of the right knee(s). The overall alignment issignificant varus. The bone quality appears to be adequate for age and reported activity level.      Assessment/Plan:  End stage arthritis, right knee   The patient history, physical examination, clinical judgment of the provider and imaging studies are consistent with end stage degenerative joint disease of the right knee(s) and total knee arthroplasty is deemed medically necessary. The treatment options including medical management, injection therapy arthroscopy and arthroplasty were discussed at length. The risks and benefits of total knee arthroplasty were presented and reviewed. The risks due to aseptic loosening, infection, stiffness, patella tracking problems, thromboembolic complications and other imponderables were discussed. The patient acknowledged the explanation, agreed to proceed with the plan and consent was signed. Patient is being admitted for inpatient treatment for surgery, pain control, PT, OT, prophylactic antibiotics, VTE prophylaxis, progressive ambulation and ADL's and discharge planning.  The patient is planning to be discharged home with OPPT after an overnight stay.   Therapy Plans: outpatient therapy. PT at [facility] first appt 12/15/22.  Disposition: Home with Kendal Hymen, friend Planned DVT Prophylaxis: aspirin 81mg  BID DME needed: Has rolling walker. Printed prescriptions for wedge pillow and compression stockings provided to patient today.  PCP: Cleared.  Cardiology: Cleared.  TXA: IV Allergies:  - Penicillin - hives - Orange extract, strawberry, chocolate - hives.  Anesthesia Concerns: None.  BMI: 38.3 Last HgbA1c: 6.5 Other: - History of CVA with left sided weakness - CHF - OSA using CPAP - T2DM - Asthma - History of knee arthroscopy bilateral knees, meniscus.  - On flexeril at baseline BID.  - Oxycodone, zofran, has flexeril, meloxicam.  - 11/28/22: Hgb 12.1, K+ 3.9, Cr. 1.01.      Patient's anticipated LOS is less than 2 midnights, meeting these requirements: - Younger than 58 - Lives within 1 hour of care - Has a competent adult at home to recover with post-op recover - NO history of  - Chronic pain requiring opiods  - Diabetes  - Coronary Artery Disease  - Heart failure  - Heart attack  - Stroke  - DVT/VTE  - Cardiac arrhythmia  - Respiratory Failure/COPD  - Renal failure  - Anemia  - Advanced Liver disease

## 2022-12-02 NOTE — Anesthesia Preprocedure Evaluation (Addendum)
Anesthesia Evaluation  Patient identified by MRN, date of birth, ID band Patient awake    Reviewed: Allergy & Precautions, NPO status , Patient's Chart, lab work & pertinent test results  Airway Mallampati: I  TM Distance: >3 FB Neck ROM: Full    Dental no notable dental hx. (+) Edentulous Upper, Edentulous Lower   Pulmonary sleep apnea and Continuous Positive Airway Pressure Ventilation , former smoker   Pulmonary exam normal breath sounds clear to auscultation       Cardiovascular hypertension, +CHF  Normal cardiovascular exam Rhythm:Regular Rate:Normal     Neuro/Psych  PSYCHIATRIC DISORDERS Anxiety Depression Bipolar Disorder   CVA, No Residual Symptoms    GI/Hepatic negative GI ROS,GERD  ,,(+)     substance abuse    Endo/Other  diabetes, Type 2    Renal/GU Lab Results      Component                Value               Date                      CREATININE               1.01 (H)            11/28/2022                     K                        3.9                 11/28/2022                         Musculoskeletal  (+) Arthritis ,    Abdominal   Peds  Hematology Lab Results      Component                Value               Date                      WBC                      7.6                 11/28/2022                HGB                      12.1                11/28/2022                HCT                      39.9                11/28/2022                MCV                      93.7                11/28/2022  PLT                      223                 11/28/2022              Anesthesia Other Findings All: PCN  Reproductive/Obstetrics                             Anesthesia Physical Anesthesia Plan  ASA: 3  Anesthesia Plan: Spinal   Post-op Pain Management: Regional block* and Minimal or no pain anticipated   Induction:   PONV Risk Score and Plan: 2 and  Treatment may vary due to age or medical condition, Midazolam and Ondansetron  Airway Management Planned: Nasal Cannula and Natural Airway  Additional Equipment: None  Intra-op Plan:   Post-operative Plan:   Informed Consent: I have reviewed the patients History and Physical, chart, labs and discussed the procedure including the risks, benefits and alternatives for the proposed anesthesia with the patient or authorized representative who has indicated his/her understanding and acceptance.     Dental advisory given  Plan Discussed with:   Anesthesia Plan Comments: (See PAT note 11/28/2022  Spinal w R adductor)       Anesthesia Quick Evaluation

## 2022-12-02 NOTE — H&P (View-Only) (Signed)
TOTAL KNEE ADMISSION H&P  Patient is being admitted for right total knee arthroplasty.  Subjective:  Chief Complaint:right knee pain.  HPI: Maureen Swanson, 57 y.o. female, has a history of pain and functional disability in the right knee due to arthritis and has failed non-surgical conservative treatments for greater than 12 weeks to includeNSAID's and/or analgesics, corticosteriod injections, flexibility and strengthening excercises, use of assistive devices, weight reduction as appropriate, and activity modification.  Onset of symptoms was gradual, starting >10 years ago with rapidlly worsening course since that time. The patient noted no past surgery on the right knee(s).  Patient currently rates pain in the right knee(s) at 10 out of 10 with activity. Patient has night pain, worsening of pain with activity and weight bearing, pain that interferes with activities of daily living, pain with passive range of motion, crepitus, and joint swelling.  Patient has evidence of subchondral cysts, subchondral sclerosis, periarticular osteophytes, and joint space narrowing by imaging studies. There is no active infection.  Patient Active Problem List   Diagnosis Date Noted   Type 2 diabetes mellitus with complication, with long-term current use of insulin (HCC) 11/23/2022   Hemorrhoids 04/02/2022   Aphthous ulcer of mouth 04/02/2022   Osteoarthritis of left knee 02/19/2022   OSA (obstructive sleep apnea) 12/31/2021   Acute gout of left foot 12/06/2019   CHF (congestive heart failure) (HCC) 08/03/2016   Cannabis use disorder, moderate, dependence (HCC) 11/20/2015   Homicidal ideation    Tobacco use disorder 07/20/2015   Diabetes mellitus (HCC) 07/20/2015   Cocaine use disorder, moderate, dependence (HCC) 04/03/2015   Schizoaffective disorder, bipolar type (HCC) 04/03/2015   Alcohol use disorder, moderate, dependence (HCC) 04/03/2015   Acute ischemic stroke (HCC) 12/08/2014   Gout attack 11/12/2014    Unilateral primary osteoarthritis, right knee 11/08/2014   Diabetic peripheral neuropathy (HCC) 11/04/2014   Mixed hyperlipidemia 11/04/2014   Small vessel disease, cerebrovascular 11/04/2014   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   Cocaine abuse (HCC) 02/19/2012   Knee pain 10/17/2010   MAMMOGRAM, ABNORMAL, RIGHT 08/05/2010   AMENORRHEA, SECONDARY 10/29/2009   HERPES ZOSTER 08/20/2009   Trichomonas vaginitis 01/26/2009   GERD 10/11/2007   Essential hypertension 02/26/2007   Past Medical History:  Diagnosis Date   Anxiety    Arthritis    back and knees   Asthma    daily and prn inhalers   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 x 12 yrs.   Sleep apnea    Stroke (HCC)    1995   Mild. no residual problems   Substance abuse (HCC)    crack cocaine:  extensive treatment with [facility] from 2014 to 2018 for this and alcohol abuse.   TMJ (temporomandibular joint disorder)     Past Surgical History:  Procedure Laterality Date   BREAST EXCISIONAL BIOPSY Right    BREAST LUMPECTOMY WITH NEEDLE LOCALIZATION  04/19/2012   Procedure: BREAST LUMPECTOMY WITH NEEDLE LOCALIZATION;  Surgeon: Paul S Toth III, MD;    Location: [facility];  Service: General;  Laterality: Right;   FOOT SURGERY Left 2022   [facility]   KNEE ARTHROPLASTY Left 02/19/2022   Procedure: COMPUTER ASSISTED TOTAL KNEE ARTHROPLASTY;  Surgeon: Swinteck, Brian, MD;  Location: [facility];  Service: Orthopedics;  Laterality: Left;   KNEE ARTHROSCOPY W/ PARTIAL MEDIAL MENISCECTOMY  05/01/2010    right    Current Outpatient Medications  Medication Sig Dispense Refill Last Dose   ABILIFY MAINTENA 400 MG PRSY prefilled syringe Inject 400 mg into the muscle every 30 (thirty) days. Last injection given on 01/10/2021. Next injection due 02/07/2021 1 each     Accu-Chek Softclix Lancets lancets Use as instructed 100 each 12    acetaminophen (TYLENOL) 500 MG tablet Take 1,000 mg by mouth every 6 (six) hours as needed for moderate pain.      amLODipine (NORVASC) 10 MG tablet Take 1 tablet (10 mg total) by mouth daily. 1 tab by mouth daily 30 tablet 11    atenolol (TENORMIN) 100 MG tablet TAKE ONE TABLET BY MOUTH DAILY 30 tablet 7    atorvastatin (LIPITOR) 40 MG tablet Take 1 tablet (40 mg total) by mouth at bedtime. 30 tablet 11    Blood Glucose Monitoring Suppl (ACCU-CHEK AVIVA PLUS) w/Device KIT Check blood glucose twice daily before meals 1 kit 0    Blood Pressure Monitoring (BLOOD PRESSURE KIT) DEVI Check blood pressure 3 times weekly 1 each 0    cetirizine (ZYRTEC) 10 MG tablet TAKE ONE TABLET BY MOUTH DAILY FOR ALLERGIES AS NEEDED (Patient taking differently: Take 10 mg by mouth daily.) 30 tablet 7    Continuous Glucose Sensor (DEXCOM G7 SENSOR) MISC Check glucose before each meal and at bedtime or if symptomatic 2 each 11    cyclobenzaprine (FLEXERIL) 5 MG tablet TAKE ONE TABLET BY MOUTH AT BEDTIME AS NEEDED (Patient taking differently: Take 5 mg by mouth at bedtime as needed for muscle spasms.) 30 tablet 0    diclofenac Sodium (VOLTAREN) 1 % GEL APPLY TWO GRAMS TO THE KNEES UP TO FOUR TIMES A DAY AS NEEDED FOR PAIN 100 g 3    empagliflozin (JARDIANCE) 10 MG TABS tablet Take 1 tablet (10 mg total) by mouth daily before breakfast. 30 tablet 11    fluticasone-salmeterol (ADVAIR) 100-50 MCG/ACT AEPB Inhale 1 puff into the lungs 2 (two) times daily. 1 each 11    gabapentin (NEURONTIN) 300 MG capsule Take 1 capsule (300 mg total) by mouth 3 (three) times daily. 90 capsule 11    glucose blood  (ACCU-CHEK AVIVA PLUS) test strip Check blood glucose twice daily before meals 100 each 12    hydrochlorothiazide (HYDRODIURIL) 25 MG tablet TAKE ONE TABLET BY MOUTH DAILY IN THE MORNING WITH BREAKFAST 30 tablet 11    lamoTRIgine (LAMICTAL) 100 MG tablet Take 100 mg by mouth daily.      losartan (COZAAR) 100 MG tablet TAKE ONE TABLET BY MOUTH DAILY IN THE MORNING 30 tablet 11    magic mouthwash (nystatin, lidocaine, diphenhydrAMINE, alum & mag hydroxide) suspension Swish and spit 5 mLs 4 (four) times daily. (Patient taking differently: Swish and spit 5 mLs 4 (four) times daily as needed for mouth pain.) 200 mL 0    melatonin 3 MG TABS tablet Take 1 tablet (3 mg total) by mouth at bedtime. 30 tablet 0    meloxicam (MOBIC) 15 MG tablet Take 1 tablet (15 mg total) by mouth daily with breakfast. (Patient not taking: Reported   on 11/25/2022) 30 tablet 2    metFORMIN (GLUCOPHAGE) 1000 MG tablet TAKE ONE TABLET BY MOUTH TWICE A DAY WITH MEAL 60 tablet 7    Multiple Vitamin (MULTIVITAMIN WITH MINERALS) TABS tablet Take 1 tablet by mouth daily.      omeprazole (PRILOSEC) 40 MG capsule TAKE ONE CAPSULE BY MOUTH DAILY IN THE MORNING HALF HOUR BEFORE BREAKFAST AND OTHER MEDS 30 capsule 11    prazosin (MINIPRESS) 1 MG capsule Take 1 mg by mouth at bedtime.      trazodone (DESYREL) 300 MG tablet Take 300 mg by mouth at bedtime.      valbenazine (INGREZZA) 80 MG capsule Take 80 mg by mouth at bedtime.      VENTOLIN HFA 108 (90 Base) MCG/ACT inhaler INHALE TWO PUFFS BY MOUTH EVERY SIX HOURS AS NEEDED FOR SHORTNESS OF BREATH 18 g 0    No current facility-administered medications for this visit.   Allergies  Allergen Reactions   Chocolate Hives   Penicillins Hives and Other (See Comments)        Orange Hives   Strawberry Extract Swelling   Tomato Hives and Other (See Comments)    "acid foods"    Social History   Tobacco Use   Smoking status: Former    Packs/day: 1.00    Years: 37.00    Additional pack  years: 0.00    Total pack years: 37.00    Types: Cigarettes    Quit date: 12/14/2020    Years since quitting: 1.9    Passive exposure: Past   Smokeless tobacco: Never  Substance Use Topics   Alcohol use: Not Currently    Comment: History of abuse.  Clean for 9 months dated 12/31/2021    Family History  Problem Relation Age of Onset   Diabetes Mother    Breast cancer Mother        She is not sure about this diagnosis   Cancer Father        Laryngeal   Multiple sclerosis Sister        not clear if this is her diagnosis   Bipolar disorder Maternal Aunt    Alcoholism Maternal Uncle    Schizophrenia Maternal Grandfather      Review of Systems  Musculoskeletal:  Positive for arthralgias, gait problem and joint swelling.  All other systems reviewed and are negative.   Objective:  Physical Exam Constitutional:      Appearance: Normal appearance.  HENT:     Head: Normocephalic and atraumatic.     Nose: Nose normal.     Mouth/Throat:     Mouth: Mucous membranes are moist.     Pharynx: Oropharynx is clear.  Eyes:     Conjunctiva/sclera: Conjunctivae normal.  Cardiovascular:     Rate and Rhythm: Normal rate and regular rhythm.     Pulses: Normal pulses.  Pulmonary:     Effort: Pulmonary effort is normal.     Breath sounds: Normal breath sounds.  Abdominal:     General: Abdomen is flat.     Palpations: Abdomen is soft.  Genitourinary:    Comments: deferred Musculoskeletal:     Cervical back: Normal range of motion and neck supple.     Comments: Examination of the right knee reveals healed arthroscopy portals. No skin wounds or lesions. Mild swelling, no effusion noted. Pain with palpation over the medial joint line, lateral joint line, periretinacular tissues, positive grind sign. Pain over the pes anserine insertion. Range of motion 0   to 117 degrees. No ligamentous instability noted. Painless hip range of motion.    Sensory and motor function intact in LE bilaterally.  Distal pedal pulses 2+ bilaterally.   No significant pedal edema. Calves soft and non-tender.   She is ambulating with no assist device today.    Skin:    General: Skin is warm and dry.     Capillary Refill: Capillary refill takes less than 2 seconds.  Neurological:     General: No focal deficit present.     Mental Status: She is alert and oriented to person, place, and time.  Psychiatric:        Mood and Affect: Mood normal.        Behavior: Behavior normal.        Thought Content: Thought content normal.        Judgment: Judgment normal.     Vital signs in last 24 hours: @VSRANGES@  Labs:   Estimated body mass index is 38.28 kg/m as calculated from the following:   Height as of 11/28/22: 5' 4" (1.626 m).   Weight as of 11/28/22: 101.2 kg.   Imaging Review Plain radiographs demonstrate severe degenerative joint disease of the right knee(s). The overall alignment issignificant varus. The bone quality appears to be adequate for age and reported activity level.      Assessment/Plan:  End stage arthritis, right knee   The patient history, physical examination, clinical judgment of the provider and imaging studies are consistent with end stage degenerative joint disease of the right knee(s) and total knee arthroplasty is deemed medically necessary. The treatment options including medical management, injection therapy arthroscopy and arthroplasty were discussed at length. The risks and benefits of total knee arthroplasty were presented and reviewed. The risks due to aseptic loosening, infection, stiffness, patella tracking problems, thromboembolic complications and other imponderables were discussed. The patient acknowledged the explanation, agreed to proceed with the plan and consent was signed. Patient is being admitted for inpatient treatment for surgery, pain control, PT, OT, prophylactic antibiotics, VTE prophylaxis, progressive ambulation and ADL's and discharge planning.  The patient is planning to be discharged home with OPPT after an overnight stay.   Therapy Plans: outpatient therapy. PT at [facility] first appt 12/15/22.  Disposition: Home with Bonnie, friend Planned DVT Prophylaxis: aspirin 81mg BID DME needed: Has rolling walker. Printed prescriptions for wedge pillow and compression stockings provided to patient today.  PCP: Cleared.  Cardiology: Cleared.  TXA: IV Allergies:  - Penicillin - hives - Orange extract, strawberry, chocolate - hives.  Anesthesia Concerns: None.  BMI: 38.3 Last HgbA1c: 6.5 Other: - History of CVA with left sided weakness - CHF - OSA using CPAP - T2DM - Asthma - History of knee arthroscopy bilateral knees, meniscus.  - On flexeril at baseline BID.  - Oxycodone, zofran, has flexeril, meloxicam.  - 11/28/22: Hgb 12.1, K+ 3.9, Cr. 1.01.      Patient's anticipated LOS is less than 2 midnights, meeting these requirements: - Younger than 65 - Lives within 1 hour of care - Has a competent adult at home to recover with post-op recover - NO history of  - Chronic pain requiring opiods  - Diabetes  - Coronary Artery Disease  - Heart failure  - Heart attack  - Stroke  - DVT/VTE  - Cardiac arrhythmia  - Respiratory Failure/COPD  - Renal failure  - Anemia  - Advanced Liver disease   

## 2022-12-10 NOTE — Progress Notes (Signed)
Message left on patient's personal voicemail informing of arrival time of 0915 tomorrow morning and that she may have clear liquids  until 0615.  Lisbeth Ply, RN

## 2022-12-11 ENCOUNTER — Other Ambulatory Visit: Payer: Self-pay

## 2022-12-11 ENCOUNTER — Ambulatory Visit (HOSPITAL_COMMUNITY): Payer: Medicaid Other | Admitting: Physician Assistant

## 2022-12-11 ENCOUNTER — Observation Stay (HOSPITAL_COMMUNITY)
Admission: RE | Admit: 2022-12-11 | Discharge: 2022-12-12 | Disposition: A | Discharge status: Home / Self Care | Payer: Medicaid Other | Source: Ambulatory Visit | Attending: Orthopedic Surgery | Admitting: Orthopedic Surgery

## 2022-12-11 ENCOUNTER — Encounter (HOSPITAL_COMMUNITY)
Admission: RE | Disposition: A | Discharge status: Home / Self Care | Payer: Self-pay | Source: Ambulatory Visit | Attending: Orthopedic Surgery

## 2022-12-11 ENCOUNTER — Encounter (HOSPITAL_COMMUNITY): Payer: Self-pay | Admitting: Orthopedic Surgery

## 2022-12-11 ENCOUNTER — Ambulatory Visit (HOSPITAL_COMMUNITY): Payer: Medicaid Other | Admitting: Anesthesiology

## 2022-12-11 ENCOUNTER — Observation Stay (HOSPITAL_COMMUNITY): Payer: Medicaid Other

## 2022-12-11 DIAGNOSIS — Z87891 Personal history of nicotine dependence: Secondary | ICD-10-CM | POA: Insufficient documentation

## 2022-12-11 DIAGNOSIS — Z8673 Personal history of transient ischemic attack (TIA), and cerebral infarction without residual deficits: Secondary | ICD-10-CM | POA: Insufficient documentation

## 2022-12-11 DIAGNOSIS — E119 Type 2 diabetes mellitus without complications: Secondary | ICD-10-CM | POA: Diagnosis not present

## 2022-12-11 DIAGNOSIS — J45909 Unspecified asthma, uncomplicated: Secondary | ICD-10-CM | POA: Insufficient documentation

## 2022-12-11 DIAGNOSIS — M1711 Unilateral primary osteoarthritis, right knee: Principal | ICD-10-CM | POA: Insufficient documentation

## 2022-12-11 DIAGNOSIS — E118 Type 2 diabetes mellitus with unspecified complications: Secondary | ICD-10-CM

## 2022-12-11 DIAGNOSIS — I509 Heart failure, unspecified: Secondary | ICD-10-CM

## 2022-12-11 DIAGNOSIS — I110 Hypertensive heart disease with heart failure: Secondary | ICD-10-CM | POA: Diagnosis not present

## 2022-12-11 DIAGNOSIS — Z96651 Presence of right artificial knee joint: Secondary | ICD-10-CM

## 2022-12-11 DIAGNOSIS — Z96652 Presence of left artificial knee joint: Secondary | ICD-10-CM | POA: Insufficient documentation

## 2022-12-11 DIAGNOSIS — Z79899 Other long term (current) drug therapy: Secondary | ICD-10-CM | POA: Insufficient documentation

## 2022-12-11 DIAGNOSIS — Z01818 Encounter for other preprocedural examination: Secondary | ICD-10-CM

## 2022-12-11 DIAGNOSIS — Z794 Long term (current) use of insulin: Secondary | ICD-10-CM | POA: Insufficient documentation

## 2022-12-11 DIAGNOSIS — I10 Essential (primary) hypertension: Secondary | ICD-10-CM

## 2022-12-11 HISTORY — PX: KNEE ARTHROPLASTY: SHX992

## 2022-12-11 LAB — GLUCOSE, CAPILLARY
Glucose-Capillary: 153 mg/dL — ABNORMAL HIGH (ref 70–99)
Glucose-Capillary: 154 mg/dL — ABNORMAL HIGH (ref 70–99)
Glucose-Capillary: 155 mg/dL — ABNORMAL HIGH (ref 70–99)
Glucose-Capillary: 364 mg/dL — ABNORMAL HIGH (ref 70–99)

## 2022-12-11 SURGERY — ARTHROPLASTY, KNEE, TOTAL, USING IMAGELESS COMPUTER-ASSISTED NAVIGATION
Anesthesia: Spinal | Site: Knee | Laterality: Right

## 2022-12-11 MED ORDER — VALBENAZINE TOSYLATE 40 MG PO CAPS
80.0000 mg | ORAL_CAPSULE | Freq: Every day | ORAL | Status: DC
  Administered 2022-12-11: 80 mg via ORAL
  Filled 2022-12-11: qty 2

## 2022-12-11 MED ORDER — LACTATED RINGERS IV SOLN: INTRAVENOUS | Status: DC

## 2022-12-11 MED ORDER — ROPIVACAINE HCL 5 MG/ML IJ SOLN
INTRAMUSCULAR | Status: DC | PRN
  Administered 2022-12-11: 30 mL via PERINEURAL

## 2022-12-11 MED ORDER — MIDAZOLAM HCL 2 MG/2ML IJ SOLN
2.0000 mg | INTRAMUSCULAR | Status: AC
  Administered 2022-12-11: 2 mg via INTRAVENOUS
  Filled 2022-12-11: qty 2

## 2022-12-11 MED ORDER — STERILE WATER FOR IRRIGATION IR SOLN
Status: DC | PRN
  Administered 2022-12-11: 2000 mL

## 2022-12-11 MED ORDER — AMLODIPINE BESYLATE 10 MG PO TABS
10.0000 mg | ORAL_TABLET | Freq: Every day | ORAL | Status: DC
  Administered 2022-12-11 – 2022-12-12 (×2): 10 mg via ORAL
  Filled 2022-12-11 (×2): qty 1

## 2022-12-11 MED ORDER — ONDANSETRON HCL 4 MG/2ML IJ SOLN: 4.0000 mg | Freq: Four times a day (QID) | INTRAMUSCULAR | Status: DC | PRN

## 2022-12-11 MED ORDER — GABAPENTIN 300 MG PO CAPS
300.0000 mg | ORAL_CAPSULE | Freq: Three times a day (TID) | ORAL | Status: DC
  Administered 2022-12-11 – 2022-12-12 (×2): 300 mg via ORAL
  Filled 2022-12-11 (×2): qty 1

## 2022-12-11 MED ORDER — DIPHENHYDRAMINE HCL 12.5 MG/5ML PO ELIX: 12.5000 mg | ORAL_SOLUTION | ORAL | Status: DC | PRN

## 2022-12-11 MED ORDER — POVIDONE-IODINE 10 % EX SWAB: 2.0000 | Freq: Once | CUTANEOUS | Status: DC

## 2022-12-11 MED ORDER — HYDROMORPHONE HCL 1 MG/ML IJ SOLN: 0.5000 mg | INTRAMUSCULAR | Status: DC | PRN

## 2022-12-11 MED ORDER — METOCLOPRAMIDE HCL 5 MG PO TABS: 5.0000 mg | ORAL_TABLET | Freq: Three times a day (TID) | ORAL | Status: DC | PRN

## 2022-12-11 MED ORDER — ASPIRIN 81 MG PO CHEW
81.0000 mg | CHEWABLE_TABLET | Freq: Two times a day (BID) | ORAL | Status: DC
  Administered 2022-12-11 – 2022-12-12 (×2): 81 mg via ORAL
  Filled 2022-12-11 (×2): qty 1

## 2022-12-11 MED ORDER — OXYCODONE HCL 5 MG PO TABS
10.0000 mg | ORAL_TABLET | ORAL | Status: DC | PRN
  Administered 2022-12-11: 15 mg via ORAL
  Administered 2022-12-12: 10 mg via ORAL
  Filled 2022-12-11: qty 3
  Filled 2022-12-11: qty 2

## 2022-12-11 MED ORDER — ONDANSETRON HCL 4 MG/2ML IJ SOLN
INTRAMUSCULAR | Status: DC | PRN
  Administered 2022-12-11: 4 mg via INTRAVENOUS

## 2022-12-11 MED ORDER — BUPIVACAINE HCL (PF) 0.25 % IJ SOLN
INTRAMUSCULAR | Status: AC
  Filled 2022-12-11: qty 30

## 2022-12-11 MED ORDER — INSULIN ASPART 100 UNIT/ML IJ SOLN
0.0000 [IU] | Freq: Every day | INTRAMUSCULAR | Status: DC
  Administered 2022-12-11: 5 [IU] via SUBCUTANEOUS

## 2022-12-11 MED ORDER — SENNA 8.6 MG PO TABS
1.0000 | ORAL_TABLET | Freq: Two times a day (BID) | ORAL | Status: DC
  Administered 2022-12-11 – 2022-12-12 (×2): 8.6 mg via ORAL
  Filled 2022-12-11 (×2): qty 1

## 2022-12-11 MED ORDER — ISOPROPYL ALCOHOL 70 % SOLN
Status: DC | PRN
  Administered 2022-12-11: 1 via TOPICAL

## 2022-12-11 MED ORDER — ACETAMINOPHEN 325 MG PO TABS: 325.0000 mg | ORAL_TABLET | Freq: Four times a day (QID) | ORAL | Status: DC | PRN

## 2022-12-11 MED ORDER — BISACODYL 10 MG RE SUPP: 10.0000 mg | Freq: Every day | RECTAL | Status: DC | PRN

## 2022-12-11 MED ORDER — TRANEXAMIC ACID-NACL 1000-0.7 MG/100ML-% IV SOLN
1000.0000 mg | INTRAVENOUS | Status: AC
  Administered 2022-12-11: 1000 mg via INTRAVENOUS
  Filled 2022-12-11: qty 100

## 2022-12-11 MED ORDER — SODIUM CHLORIDE (PF) 0.9 % IJ SOLN
INTRAMUSCULAR | Status: AC
  Filled 2022-12-11: qty 50

## 2022-12-11 MED ORDER — PRAZOSIN HCL 1 MG PO CAPS
1.0000 mg | ORAL_CAPSULE | Freq: Every day | ORAL | Status: DC
  Administered 2022-12-11: 1 mg via ORAL
  Filled 2022-12-11: qty 1

## 2022-12-11 MED ORDER — ACETAMINOPHEN 500 MG PO TABS
1000.0000 mg | ORAL_TABLET | Freq: Four times a day (QID) | ORAL | Status: AC
  Administered 2022-12-11 – 2022-12-12 (×4): 1000 mg via ORAL
  Filled 2022-12-11 (×4): qty 2

## 2022-12-11 MED ORDER — METHOCARBAMOL 500 MG IVPB - SIMPLE MED: 500.0000 mg | Freq: Four times a day (QID) | INTRAVENOUS | Status: DC | PRN

## 2022-12-11 MED ORDER — POLYETHYLENE GLYCOL 3350 17 G PO PACK: 17.0000 g | PACK | Freq: Every day | ORAL | Status: DC | PRN

## 2022-12-11 MED ORDER — PHENYLEPHRINE HCL-NACL 20-0.9 MG/250ML-% IV SOLN
INTRAVENOUS | Status: DC | PRN
  Administered 2022-12-11: 30 ug/min via INTRAVENOUS

## 2022-12-11 MED ORDER — DEXAMETHASONE SODIUM PHOSPHATE 10 MG/ML IJ SOLN
INTRAMUSCULAR | Status: AC
  Filled 2022-12-11: qty 1

## 2022-12-11 MED ORDER — INSULIN ASPART 100 UNIT/ML IJ SOLN: 0.0000 [IU] | INTRAMUSCULAR | Status: DC | PRN

## 2022-12-11 MED ORDER — CEFAZOLIN SODIUM-DEXTROSE 2-4 GM/100ML-% IV SOLN
2.0000 g | Freq: Four times a day (QID) | INTRAVENOUS | Status: AC
  Administered 2022-12-11 – 2022-12-12 (×2): 2 g via INTRAVENOUS
  Filled 2022-12-11 (×2): qty 100

## 2022-12-11 MED ORDER — METHOCARBAMOL 500 MG PO TABS
500.0000 mg | ORAL_TABLET | Freq: Four times a day (QID) | ORAL | Status: DC | PRN
  Administered 2022-12-11 – 2022-12-12 (×3): 500 mg via ORAL
  Filled 2022-12-11 (×3): qty 1

## 2022-12-11 MED ORDER — ATENOLOL 50 MG PO TABS
100.0000 mg | ORAL_TABLET | Freq: Every day | ORAL | Status: DC
  Administered 2022-12-12: 100 mg via ORAL
  Filled 2022-12-11: qty 2

## 2022-12-11 MED ORDER — OXYCODONE HCL 5 MG/5ML PO SOLN: 5.0000 mg | Freq: Once | ORAL | Status: DC | PRN

## 2022-12-11 MED ORDER — PROPOFOL 500 MG/50ML IV EMUL
INTRAVENOUS | Status: DC | PRN
  Administered 2022-12-11: 60 mg via INTRAVENOUS

## 2022-12-11 MED ORDER — LAMOTRIGINE 100 MG PO TABS
100.0000 mg | ORAL_TABLET | Freq: Every day | ORAL | Status: DC
  Administered 2022-12-11 – 2022-12-12 (×2): 100 mg via ORAL
  Filled 2022-12-11 (×2): qty 1

## 2022-12-11 MED ORDER — CHLORHEXIDINE GLUCONATE 0.12 % MT SOLN
15.0000 mL | Freq: Once | OROMUCOSAL | Status: AC
  Administered 2022-12-11: 15 mL via OROMUCOSAL

## 2022-12-11 MED ORDER — ONDANSETRON HCL 4 MG/2ML IJ SOLN: 4.0000 mg | Freq: Once | INTRAMUSCULAR | Status: DC | PRN

## 2022-12-11 MED ORDER — MIDAZOLAM HCL 2 MG/2ML IJ SOLN
INTRAMUSCULAR | Status: AC
  Filled 2022-12-11: qty 2

## 2022-12-11 MED ORDER — METOCLOPRAMIDE HCL 5 MG/ML IJ SOLN: 5.0000 mg | Freq: Three times a day (TID) | INTRAMUSCULAR | Status: DC | PRN

## 2022-12-11 MED ORDER — EPINEPHRINE PF 1 MG/ML IJ SOLN
INTRAMUSCULAR | Status: AC
  Filled 2022-12-11: qty 1

## 2022-12-11 MED ORDER — ALUM & MAG HYDROXIDE-SIMETH 200-200-20 MG/5ML PO SUSP: 30.0000 mL | ORAL | Status: DC | PRN

## 2022-12-11 MED ORDER — MENTHOL 3 MG MT LOZG: 1.0000 | LOZENGE | OROMUCOSAL | Status: DC | PRN

## 2022-12-11 MED ORDER — ONDANSETRON HCL 4 MG PO TABS: 4.0000 mg | ORAL_TABLET | Freq: Four times a day (QID) | ORAL | Status: DC | PRN

## 2022-12-11 MED ORDER — LIDOCAINE HCL (PF) 2 % IJ SOLN
INTRAMUSCULAR | Status: AC
  Filled 2022-12-11: qty 5

## 2022-12-11 MED ORDER — PHENOL 1.4 % MT LIQD: 1.0000 | OROMUCOSAL | Status: DC | PRN

## 2022-12-11 MED ORDER — FENTANYL CITRATE PF 50 MCG/ML IJ SOSY
100.0000 ug | PREFILLED_SYRINGE | INTRAMUSCULAR | Status: AC
  Administered 2022-12-11: 50 ug via INTRAVENOUS
  Filled 2022-12-11: qty 2

## 2022-12-11 MED ORDER — CEFAZOLIN SODIUM-DEXTROSE 2-4 GM/100ML-% IV SOLN
2.0000 g | INTRAVENOUS | Status: AC
  Administered 2022-12-11: 2 g via INTRAVENOUS
  Filled 2022-12-11: qty 100

## 2022-12-11 MED ORDER — KETOROLAC TROMETHAMINE 30 MG/ML IJ SOLN
INTRAMUSCULAR | Status: DC | PRN
  Administered 2022-12-11: 30 mg

## 2022-12-11 MED ORDER — DEXAMETHASONE SODIUM PHOSPHATE 10 MG/ML IJ SOLN
INTRAMUSCULAR | Status: DC | PRN
  Administered 2022-12-11: 4 mg via INTRAVENOUS

## 2022-12-11 MED ORDER — ACETAMINOPHEN 500 MG PO TABS
1000.0000 mg | ORAL_TABLET | Freq: Once | ORAL | Status: AC
  Administered 2022-12-11: 1000 mg via ORAL
  Filled 2022-12-11: qty 2

## 2022-12-11 MED ORDER — SODIUM CHLORIDE 0.9 % IR SOLN
Status: DC | PRN
  Administered 2022-12-11: 1000 mL
  Administered 2022-12-11: 3000 mL

## 2022-12-11 MED ORDER — SODIUM CHLORIDE 0.9 % IV SOLN: INTRAVENOUS | Status: DC

## 2022-12-11 MED ORDER — ONDANSETRON HCL 4 MG/2ML IJ SOLN
INTRAMUSCULAR | Status: AC
  Filled 2022-12-11: qty 2

## 2022-12-11 MED ORDER — PROPOFOL 10 MG/ML IV BOLUS
INTRAVENOUS | Status: DC | PRN
  Administered 2022-12-11: 80 ug/kg/min via INTRAVENOUS

## 2022-12-11 MED ORDER — TRAZODONE HCL 100 MG PO TABS
300.0000 mg | ORAL_TABLET | Freq: Every day | ORAL | Status: DC
  Administered 2022-12-11: 300 mg via ORAL
  Filled 2022-12-11: qty 3

## 2022-12-11 MED ORDER — SODIUM CHLORIDE (PF) 0.9 % IJ SOLN
INTRAMUSCULAR | Status: DC | PRN
  Administered 2022-12-11: 30 mL

## 2022-12-11 MED ORDER — OXYCODONE HCL 5 MG PO TABS
5.0000 mg | ORAL_TABLET | ORAL | Status: DC | PRN
  Administered 2022-12-11 – 2022-12-12 (×3): 10 mg via ORAL
  Filled 2022-12-11 (×3): qty 2

## 2022-12-11 MED ORDER — DOCUSATE SODIUM 100 MG PO CAPS
100.0000 mg | ORAL_CAPSULE | Freq: Two times a day (BID) | ORAL | Status: DC
  Administered 2022-12-11 – 2022-12-12 (×2): 100 mg via ORAL
  Filled 2022-12-11 (×2): qty 1

## 2022-12-11 MED ORDER — INSULIN ASPART 100 UNIT/ML IJ SOLN
0.0000 [IU] | Freq: Three times a day (TID) | INTRAMUSCULAR | Status: DC
  Administered 2022-12-11: 2 [IU] via SUBCUTANEOUS
  Administered 2022-12-12: 5 [IU] via SUBCUTANEOUS

## 2022-12-11 MED ORDER — ACETAMINOPHEN 10 MG/ML IV SOLN: 1000.0000 mg | Freq: Once | INTRAVENOUS | Status: DC | PRN

## 2022-12-11 MED ORDER — PANTOPRAZOLE SODIUM 40 MG PO TBEC
40.0000 mg | DELAYED_RELEASE_TABLET | Freq: Every day | ORAL | Status: DC
  Administered 2022-12-11 – 2022-12-12 (×2): 40 mg via ORAL
  Filled 2022-12-11 (×2): qty 1

## 2022-12-11 MED ORDER — BUPIVACAINE-EPINEPHRINE 0.25% -1:200000 IJ SOLN
INTRAMUSCULAR | Status: DC | PRN
  Administered 2022-12-11: 30 mL

## 2022-12-11 MED ORDER — HYDROMORPHONE HCL 1 MG/ML IJ SOLN: 0.2500 mg | INTRAMUSCULAR | Status: DC | PRN

## 2022-12-11 MED ORDER — ALBUTEROL SULFATE HFA 108 (90 BASE) MCG/ACT IN AERS: 2.0000 | INHALATION_SPRAY | Freq: Four times a day (QID) | RESPIRATORY_TRACT | Status: DC | PRN

## 2022-12-11 MED ORDER — CLONIDINE HCL (ANALGESIA) 100 MCG/ML EP SOLN
EPIDURAL | Status: DC | PRN
  Administered 2022-12-11: 100 ug

## 2022-12-11 MED ORDER — MOMETASONE FURO-FORMOTEROL FUM 100-5 MCG/ACT IN AERO
2.0000 | INHALATION_SPRAY | Freq: Two times a day (BID) | RESPIRATORY_TRACT | Status: DC
  Administered 2022-12-11 – 2022-12-12 (×2): 2 via RESPIRATORY_TRACT
  Filled 2022-12-11: qty 8.8

## 2022-12-11 MED ORDER — ORAL CARE MOUTH RINSE: 15.0000 mL | Freq: Once | OROMUCOSAL | Status: AC

## 2022-12-11 MED ORDER — ARIPIPRAZOLE ER 400 MG IM PRSY: 400.0000 mg | PREFILLED_SYRINGE | INTRAMUSCULAR | Status: DC

## 2022-12-11 MED ORDER — LIDOCAINE HCL (PF) 2 % IJ SOLN
INTRAMUSCULAR | Status: DC | PRN
  Administered 2022-12-11: 60 mg via INTRADERMAL

## 2022-12-11 MED ORDER — BUPIVACAINE IN DEXTROSE 0.75-8.25 % IT SOLN
INTRATHECAL | Status: DC | PRN
  Administered 2022-12-11: 13.5 mg via INTRATHECAL

## 2022-12-11 MED ORDER — KETOROLAC TROMETHAMINE 30 MG/ML IJ SOLN
INTRAMUSCULAR | Status: AC
  Filled 2022-12-11: qty 1

## 2022-12-11 MED ORDER — OXYCODONE HCL 5 MG PO TABS: 5.0000 mg | ORAL_TABLET | Freq: Once | ORAL | Status: DC | PRN

## 2022-12-11 MED ORDER — LORATADINE 10 MG PO TABS
10.0000 mg | ORAL_TABLET | Freq: Every day | ORAL | Status: DC
  Administered 2022-12-11 – 2022-12-12 (×2): 10 mg via ORAL
  Filled 2022-12-11 (×2): qty 1

## 2022-12-11 SURGICAL SUPPLY — 69 items
ADH SKN CLS APL DERMABOND .7 (GAUZE/BANDAGES/DRESSINGS) ×2
APL PRP STRL LF DISP 70% ISPRP (MISCELLANEOUS) ×2
BAG COUNTER SPONGE SURGICOUNT (BAG) IMPLANT
BAG SPEC THK2 15X12 ZIP CLS (MISCELLANEOUS)
BAG SPNG CNTER NS LX DISP (BAG)
BAG ZIPLOCK 12X15 (MISCELLANEOUS) IMPLANT
BATTERY INSTRU NAVIGATION (MISCELLANEOUS) ×3 IMPLANT
BLADE SAW RECIPROCATING 77.5 (BLADE) ×1 IMPLANT
BNDG CMPR 5X4 KNIT ELC UNQ LF (GAUZE/BANDAGES/DRESSINGS) ×1
BNDG CMPR 5X62 HK CLSR LF (GAUZE/BANDAGES/DRESSINGS) ×1
BNDG CMPR 6"X 5 YARDS HK CLSR (GAUZE/BANDAGES/DRESSINGS) ×1
BNDG ELASTIC 4INX 5YD STR LF (GAUZE/BANDAGES/DRESSINGS) ×1 IMPLANT
BNDG ELASTIC 6INX 5YD STR LF (GAUZE/BANDAGES/DRESSINGS) ×1 IMPLANT
BTRY SRG DRVR LF (MISCELLANEOUS) ×3
CHLORAPREP W/TINT 26 (MISCELLANEOUS) ×2 IMPLANT
COMP FEM STD PS KNEE 7 RT (Joint) ×1 IMPLANT
COMPONENT FEM STD PS KNEE 7 RT (Joint) IMPLANT
COVER SURGICAL LIGHT HANDLE (MISCELLANEOUS) ×1 IMPLANT
DERMABOND ADVANCED .7 DNX12 (GAUZE/BANDAGES/DRESSINGS) ×2 IMPLANT
DRAPE SHEET LG 3/4 BI-LAMINATE (DRAPES) ×3 IMPLANT
DRAPE U-SHAPE 47X51 STRL (DRAPES) ×1 IMPLANT
DRSG AQUACEL AG ADV 3.5X10 (GAUZE/BANDAGES/DRESSINGS) ×1 IMPLANT
ELECT BLADE TIP CTD 4 INCH (ELECTRODE) ×1 IMPLANT
ELECT REM PT RETURN 15FT ADLT (MISCELLANEOUS) ×1 IMPLANT
GAUZE SPONGE 4X4 12PLY STRL (GAUZE/BANDAGES/DRESSINGS) ×1 IMPLANT
GLOVE BIO SURGEON STRL SZ7 (GLOVE) ×1 IMPLANT
GLOVE BIO SURGEON STRL SZ8.5 (GLOVE) ×2 IMPLANT
GLOVE BIOGEL PI IND STRL 7.5 (GLOVE) ×1 IMPLANT
GLOVE BIOGEL PI IND STRL 8.5 (GLOVE) ×1 IMPLANT
GOWN SPEC L3 XXLG W/TWL (GOWN DISPOSABLE) ×1 IMPLANT
GOWN STRL REUS W/ TWL XL LVL3 (GOWN DISPOSABLE) ×1 IMPLANT
GOWN STRL REUS W/TWL XL LVL3 (GOWN DISPOSABLE) ×1
HANDPIECE INTERPULSE COAX TIP (DISPOSABLE) ×1
HOLDER FOLEY CATH W/STRAP (MISCELLANEOUS) ×1 IMPLANT
HOOD PEEL AWAY T7 (MISCELLANEOUS) ×3 IMPLANT
IMPL PATELLA METAL SZ32X10 (Joint) IMPLANT
KIT TURNOVER KIT A (KITS) IMPLANT
LINER TIB ASF PS CD/3-9 11 RT (Liner) IMPLANT
MARKER SKIN DUAL TIP RULER LAB (MISCELLANEOUS) ×1 IMPLANT
NDL SAFETY ECLIP 18X1.5 (MISCELLANEOUS) ×1 IMPLANT
NDL SPNL 18GX3.5 QUINCKE PK (NEEDLE) ×1 IMPLANT
NEEDLE SPNL 18GX3.5 QUINCKE PK (NEEDLE) ×1 IMPLANT
NS IRRIG 1000ML POUR BTL (IV SOLUTION) ×1 IMPLANT
PACK TOTAL KNEE CUSTOM (KITS) ×1 IMPLANT
PADDING CAST COTTON 6X4 STRL (CAST SUPPLIES) ×1 IMPLANT
PIN DRILL HDLS TROCAR 75 4PK (PIN) IMPLANT
PROTECTOR NERVE ULNAR (MISCELLANEOUS) ×1 IMPLANT
SAW OSC TIP CART 19.5X105X1.3 (SAW) ×1 IMPLANT
SCREW FEMALE HEX FIX 25X2.5 (ORTHOPEDIC DISPOSABLE SUPPLIES) IMPLANT
SEALER BIPOLAR AQUA 6.0 (INSTRUMENTS) ×1 IMPLANT
SET HNDPC FAN SPRY TIP SCT (DISPOSABLE) ×1 IMPLANT
SET PAD KNEE POSITIONER (MISCELLANEOUS) ×1 IMPLANT
SOLUTION PRONTOSAN WOUND 350ML (IRRIGATION / IRRIGATOR) IMPLANT
SPIKE FLUID TRANSFER (MISCELLANEOUS) ×2 IMPLANT
STEM TIB PS KNEE D 0D RT (Stem) IMPLANT
SUT MNCRL AB 3-0 PS2 18 (SUTURE) ×1 IMPLANT
SUT MON AB 2-0 CT1 36 (SUTURE) ×1 IMPLANT
SUT STRATAFIX PDO 1 14 VIOLET (SUTURE) ×1
SUT STRATFX PDO 1 14 VIOLET (SUTURE) ×1
SUT VIC AB 1 CTX 36 (SUTURE) ×2
SUT VIC AB 1 CTX36XBRD ANBCTR (SUTURE) ×2 IMPLANT
SUT VIC AB 2-0 CT1 27 (SUTURE) ×1
SUT VIC AB 2-0 CT1 TAPERPNT 27 (SUTURE) ×1 IMPLANT
SUTURE STRATFX PDO 1 14 VIOLET (SUTURE) ×1 IMPLANT
SYR 3ML LL SCALE MARK (SYRINGE) ×1 IMPLANT
TRAY FOLEY MTR SLVR 16FR STAT (SET/KITS/TRAYS/PACK) IMPLANT
TUBE SUCTION HIGH CAP CLEAR NV (SUCTIONS) ×1 IMPLANT
WATER STERILE IRR 1000ML POUR (IV SOLUTION) ×2 IMPLANT
WRAP KNEE MAXI GEL POST OP (GAUZE/BANDAGES/DRESSINGS) IMPLANT

## 2022-12-11 NOTE — Anesthesia Procedure Notes (Signed)
Anesthesia Regional Block: Adductor canal block   Pre-Anesthetic Checklist: , timeout performed,  Correct Patient, Correct Site, Correct Laterality,  Correct Procedure, Correct Position, site marked,  Risks and benefits discussed,  Surgical consent,  Pre-op evaluation,  At surgeon's request and post-op pain management  Laterality: Lower and Right  Prep: chloraprep       Needles:  Injection technique: Single-shot  Needle Type: Echogenic Needle     Needle Length: 9cm  Needle Gauge: 22     Additional Needles:   Procedures:,,,, ultrasound used (permanent image in chart),,    Narrative:  Start time: 12/11/2022 11:05 AM End time: 12/11/2022 11:09 AM Injection made incrementally with aspirations every 5 mL.  Performed by: Personally  Anesthesiologist: Trevor Iha, MD  Additional Notes: Block assessed prior to surgery. Pt tolerated procedure well.

## 2022-12-11 NOTE — Anesthesia Procedure Notes (Signed)
Spinal  Patient location during procedure: OR Start time: 12/11/2022 12:14 PM End time: 12/11/2022 12:18 PM Reason for block: surgical anesthesia Staffing Performed: anesthesiologist  Anesthesiologist: Trevor Iha, MD Performed by: Trevor Iha, MD Authorized by: Trevor Iha, MD   Preanesthetic Checklist Completed: patient identified, IV checked, site marked, risks and benefits discussed, surgical consent, monitors and equipment checked, pre-op evaluation and timeout performed Spinal Block Patient position: sitting Prep: DuraPrep and site prepped and draped Patient monitoring: heart rate, cardiac monitor, continuous pulse ox and blood pressure Approach: midline Location: L3-4 Injection technique: single-shot Needle Needle type: Sprotte  Needle gauge: 24 G Needle length: 9 cm Needle insertion depth: 7 cm Assessment Sensory level: T4 Events: CSF return Additional Notes  1 Attempt (s). Pt tolerated procedure well.

## 2022-12-11 NOTE — Transfer of Care (Signed)
Immediate Anesthesia Transfer of Care Note  Patient: Maureen Swanson  Procedure(s) Performed: RIGHT COMPUTER ASSISTED TOTAL KNEE ARTHROPLASTY (Right: Knee)  Patient Location: PACU  Anesthesia Type:Spinal  Level of Consciousness: awake, alert , oriented, and patient cooperative  Airway & Oxygen Therapy: Patient Spontanous Breathing and Patient connected to face mask oxygen  Post-op Assessment: Report given to RN and Post -op Vital signs reviewed and stable  Post vital signs: Reviewed and stable  Last Vitals:  Vitals Value Taken Time  BP 127/74 12/11/22 1506  Temp    Pulse 65 12/11/22 1507  Resp 21 12/11/22 1508  SpO2 95 % 12/11/22 1507  Vitals shown include unvalidated device data.  Last Pain:  Vitals:   12/11/22 1140  TempSrc:   PainSc: Asleep         Complications: No notable events documented.

## 2022-12-11 NOTE — Interval H&P Note (Signed)
History and Physical Interval Note:  12/11/2022 9:47 AM  Maureen Swanson  has presented today for surgery, with the diagnosis of Right knee osteoarthritis.  The various methods of treatment have been discussed with the patient and family. After consideration of risks, benefits and other options for treatment, the patient has consented to  Procedure(s): COMPUTER ASSISTED TOTAL KNEE ARTHROPLASTY (Right) as a surgical intervention.  The patient's history has been reviewed, patient examined, no change in status, stable for surgery.  I have reviewed the patient's chart and labs.  Questions were answered to the patient's satisfaction.     Iline Oven Neilson Oehlert

## 2022-12-11 NOTE — Discharge Instructions (Signed)
 Dr. Brian Swinteck Total Joint Specialist [facility] [address] [place], [place] [address] [phone]  TOTAL KNEE REPLACEMENT POSTOPERATIVE DIRECTIONS    Knee Rehabilitation, Guidelines Following Surgery  Results after knee surgery are often greatly improved when you follow the exercise, range of motion and muscle strengthening exercises prescribed by your doctor. Safety measures are also important to protect the knee from further injury. Any time any of these exercises cause you to have increased pain or swelling in your knee joint, decrease the amount until you are comfortable again and slowly increase them. If you have problems or questions, call your caregiver or physical therapist for advice.   WEIGHT BEARING Weight bearing as tolerated with assist device (walker, cane, etc) as directed, use it as long as suggested by your surgeon or therapist, typically at least 4-6 weeks.  HOME CARE INSTRUCTIONS  Remove items at home which could result in a fall. This includes throw rugs or furniture in walking pathways.  Continue medications as instructed at time of discharge. You may have some home medications which will be placed on hold until you complete the course of blood thinner medication.  You may start showering once you are discharged home but do not submerge the incision under water. Just pat the incision dry and apply a dry gauze dressing on daily. Walk with walker as instructed.  You may resume a sexual relationship in one month or when given the OK by your doctor.  Use walker as long as suggested by your caregivers. Avoid periods of inactivity such as sitting longer than an hour when not asleep. This helps prevent blood clots.  You may put full weight on your legs and walk as much as is comfortable.  You may return to work once you are cleared by your doctor.  Do not drive a car for 6 weeks or until released by you surgeon.  Do not drive while  taking narcotics.  Wear the elastic stockings for three weeks following surgery during the day but you may remove then at night. Make sure you keep all of your appointments after your operation with all of your doctors and caregivers. You should call the office at the above phone number and make an appointment for approximately two weeks after the date of your surgery. Do not remove your surgical dressing. The dressing is waterproof; you may take showers in 3 days, but do not take tub baths or submerge the dressing. Please pick up a stool softener and laxative for home use as long as you are requiring pain medications. ICE to the affected knee every three hours for 30 minutes at a time and then as needed for pain and swelling.  Continue to use ice on the knee for pain and swelling from surgery. You may notice swelling that will progress down to the foot and ankle.  This is normal after surgery.  Elevate the leg when you are not up walking on it.   It is important for you to complete the blood thinner medication as prescribed by your doctor. Continue to use the breathing machine which will help keep your temperature down.  It is common for your temperature to cycle up and down following surgery, especially at night when you are not up moving around and exerting yourself.  The breathing machine keeps your lungs expanded and your temperature down.  RANGE OF MOTION AND STRENGTHENING EXERCISES  Rehabilitation of the knee is important following a knee injury or an   operation. After just a few days of immobilization, the muscles of the thigh which control the knee become weakened and shrink (atrophy). Knee exercises are designed to build up the tone and strength of the thigh muscles and to improve knee motion. Often times heat used for twenty to thirty minutes before working out will loosen up your tissues and help with improving the range of motion but do not use heat for the first two weeks following surgery.  These exercises can be done on a training (exercise) mat, on the floor, on a table or on a bed. Use what ever works the best and is most comfortable for you Knee exercises include:  Leg Lifts - While your knee is still immobilized in a splint or cast, you can do straight leg raises. Lift the leg to 60 degrees, hold for 3 sec, and slowly lower the leg. Repeat 10-20 times 2-3 times daily. Perform this exercise against resistance later as your knee gets better.  Quad and Hamstring Sets - Tighten up the muscle on the front of the thigh (Quad) and hold for 5-10 sec. Repeat this 10-20 times hourly. Hamstring sets are done by pushing the foot backward against an object and holding for 5-10 sec. Repeat as with quad sets.  A rehabilitation program following serious knee injuries can speed recovery and prevent re-injury in the future due to weakened muscles. Contact your doctor or a physical therapist for more information on knee rehabilitation.   POST-OPERATIVE OPIOID TAPER INSTRUCTIONS: It is important to wean off of your opioid medication as soon as possible. If you do not need pain medication after your surgery it is ok to stop day one. Opioids include: Codeine, Hydrocodone(Norco, Vicodin), Oxycodone(Percocet, oxycontin) and hydromorphone amongst others.  Long term and even short term use of opiods can cause: Increased pain response Dependence Constipation Depression Respiratory depression And more.  Withdrawal symptoms can include Flu like symptoms Nausea, vomiting And more Techniques to manage these symptoms Hydrate well Eat regular healthy meals Stay active Use relaxation techniques(deep breathing, meditating, yoga) Do Not substitute Alcohol to help with tapering If you have been on opioids for less than two weeks and do not have pain than it is ok to stop all together.  Plan to wean off of opioids This plan should start within one week post op of your joint replacement. Maintain the same  interval or time between taking each dose and first decrease the dose.  Cut the total daily intake of opioids by one tablet each day Next start to increase the time between doses. The last dose that should be eliminated is the evening dose.    SKILLED REHAB INSTRUCTIONS: If the patient is transferred to a skilled rehab facility following release from the hospital, a list of the current medications will be sent to the facility for the patient to continue.  When discharged from the skilled rehab facility, please have the facility set up the patient's Home Health Physical Therapy prior to being released. Also, the skilled facility will be responsible for providing the patient with their medications at time of release from the facility to include their pain medication, the muscle relaxants, and their blood thinner medication. If the patient is still at the rehab facility at time of the two week follow up appointment, the skilled rehab facility will also need to assist the patient in arranging follow up appointment in our office and any transportation needs.  MAKE SURE YOU:  Understand these instructions.  Will watch   your condition.  Will get help right away if you are not doing well or get worse.    Pick up stool softner and laxative for home use following surgery while on pain medications. Do NOT remove your dressing. You may shower.  Do not take tub baths or submerge incision under water. May shower starting three days after surgery. Please use a clean towel to pat the incision dry following showers. Continue to use ice for pain and swelling after surgery. Do not use any lotions or creams on the incision until instructed by your surgeon.  

## 2022-12-11 NOTE — Op Note (Signed)
OPERATIVE REPORT  SURGEON: Samson Frederic, MD   ASSISTANT: Clint Bolder, PA-C  PREOPERATIVE DIAGNOSIS: Primary Right knee arthritis.   POSTOPERATIVE DIAGNOSIS: Primary Right knee arthritis.   PROCEDURE: Computer assisted Right total knee arthroplasty.   IMPLANTS: Zimmer Persona PPS Cementless CR femur, size 7. Persona 0 degree Spiked Keel OsseoTi Tibia, size D. Vivacit-E polyethelyene insert, size 11 mm, CR. TM standard patella, size 32 mm.  ANESTHESIA:  MAC, Regional, and Spinal  TOURNIQUET TIME: Not utilized.   ESTIMATED BLOOD LOSS: 250 mL.    ANTIBIOTICS: 2g Ancef.  DRAINS: None.  COMPLICATIONS: None   CONDITION: PACU - hemodynamically stable.   BRIEF CLINICAL NOTE: Maureen Swanson is a 56 y.o. female with a long-standing history of Right knee arthritis. After failing conservative management, the patient was indicated for total knee arthroplasty. The risks, benefits, and alternatives to the procedure were explained, and the patient elected to proceed.  PROCEDURE IN DETAIL: Adductor canal block was obtained in the pre-op holding area. Once inside the operative room, spinal anesthesia was obtained, and a foley catheter was inserted. The patient was then positioned and the lower extremity was prepped and draped in the normal sterile surgical fashion.  A time-out was called verifying side and site of surgery. The patient received IV antibiotics within 60 minutes of beginning the procedure. A tourniquet was not utilized.   An anterior approach to the knee was performed utilizing a midvastus arthrotomy. A medial release was performed and the patellar fat pad was excised. Stryker imageless navigation was used to cut the distal femur perpendicular to the mechanical axis. A freehand patellar resection was performed, and the patella was sized an prepared with 3 lug holes.  Nagivation was used to make a neutral proximal tibia resection, taking 10 mm of bone from the less affected lateral side  with 3 degrees of slope. The menisci were excised. A spacer block was placed, and the alignment and balance in extension were confirmed.   The distal femur was sized using the 3-degree external rotation guide referencing the posterior femoral cortex. The appropriate 4-in-1 cutting block was pinned into place. Rotation was checked using Whiteside's line, the epicondylar axis, and then confirmed with a spacer block in flexion. The remaining femoral cuts were performed, taking care to protect the MCL.  The tibia was sized and the trial tray was pinned into place. The remaining trail components were inserted. The knee was stable to varus and valgus stress through a full range of motion. The patella tracked centrally, and the PCL was well balanced. The trial components were removed, and the proximal tibial surface was prepared. Final components were impacted into place. The knee was tested for a final time and found to be well balanced.   The wound was copiously irrigated with Prontosan solution and normal saline using pulse lavage.  Marcaine solution was injected into the periarticular soft tissue.  The wound was closed in layers using #1 Vicryl and Stratafix for the fascia, 2-0 Vicryl for the subcutaneous fat, 2-0 Monocryl for the deep dermal layer, 3-0 running Monocryl subcuticular Stitch, and 4-0 Monocryl stay sutures at both ends of the wound. Dermabond was applied to the skin.  Once the glue was fully dried, an Aquacell Ag and compressive dressing were applied.  The patient was transported to the recovery room in stable condition.  Sponge, needle, and instrument counts were correct at the end of the case x2.  The patient tolerated the procedure well and there were no known  complications.  The aquamantis was utilized for this case to help facilitate better hemostasis as patient was felt to be at increased risk of bleeding because of complex case requiring increased OR time and/or exposure.  A oscillating  saw tip was utilized for this case to prevent damage to the soft tissue structures such as muscles, ligaments and tendons, and to ensure accurate bone cuts. This patient was at increased risk for above structures due to  minimally invasive approach.  Please note that a surgical assistant was a medical necessity for this procedure in order to perform it in a safe and expeditious manner. Surgical assistant was necessary to retract the ligaments and vital neurovascular structures to prevent injury to them and also necessary for proper positioning of the limb to allow for anatomic placement of the prosthesis.

## 2022-12-12 ENCOUNTER — Encounter (HOSPITAL_COMMUNITY): Payer: Self-pay | Admitting: Orthopedic Surgery

## 2022-12-12 DIAGNOSIS — M1711 Unilateral primary osteoarthritis, right knee: Secondary | ICD-10-CM | POA: Diagnosis not present

## 2022-12-12 LAB — CBC
HCT: 35.7 % — ABNORMAL LOW (ref 36.0–46.0)
Hemoglobin: 11 g/dL — ABNORMAL LOW (ref 12.0–15.0)
MCH: 28.8 pg (ref 26.0–34.0)
MCHC: 30.8 g/dL (ref 30.0–36.0)
MCV: 93.5 fL (ref 80.0–100.0)
Platelets: 192 10*3/uL (ref 150–400)
RBC: 3.82 MIL/uL — ABNORMAL LOW (ref 3.87–5.11)
RDW: 12.3 % (ref 11.5–15.5)
WBC: 9.1 10*3/uL (ref 4.0–10.5)
nRBC: 0 % (ref 0.0–0.2)

## 2022-12-12 LAB — GLUCOSE, CAPILLARY
Glucose-Capillary: 225 mg/dL — ABNORMAL HIGH (ref 70–99)
Glucose-Capillary: 282 mg/dL — ABNORMAL HIGH (ref 70–99)
Glucose-Capillary: 232 mg/dL — ABNORMAL HIGH (ref 70–99)

## 2022-12-12 LAB — BASIC METABOLIC PANEL
Anion gap: 9 (ref 5–15)
BUN: 21 mg/dL — ABNORMAL HIGH (ref 6–20)
CO2: 24 mmol/L (ref 22–32)
Calcium: 8.9 mg/dL (ref 8.9–10.3)
Chloride: 101 mmol/L (ref 98–111)
Creatinine, Ser: 1.01 mg/dL — ABNORMAL HIGH (ref 0.44–1.00)
GFR, Estimated: >60 mL/min (ref >60)
Glucose, Bld: 236 mg/dL — ABNORMAL HIGH (ref 70–99)
Potassium: 4.1 mmol/L (ref 3.5–5.1)
Sodium: 134 mmol/L — ABNORMAL LOW (ref 135–145)

## 2022-12-12 MED ORDER — ASPIRIN 81 MG PO CHEW: 81.0000 mg | CHEWABLE_TABLET | Freq: Two times a day (BID) | ORAL | 0 refills | Status: AC

## 2022-12-12 MED ORDER — ONDANSETRON HCL 4 MG PO TABS: 4.0000 mg | ORAL_TABLET | Freq: Three times a day (TID) | ORAL | 0 refills | Status: DC | PRN

## 2022-12-12 MED ORDER — SENNA 8.6 MG PO TABS: 2.0000 | ORAL_TABLET | Freq: Every day | ORAL | 0 refills | Status: AC

## 2022-12-12 MED ORDER — POLYETHYLENE GLYCOL 3350 17 G PO PACK: 17.0000 g | PACK | Freq: Every day | ORAL | 0 refills | Status: AC | PRN

## 2022-12-12 MED ORDER — DOCUSATE SODIUM 100 MG PO CAPS: 100.0000 mg | ORAL_CAPSULE | Freq: Two times a day (BID) | ORAL | 0 refills | Status: AC

## 2022-12-12 MED ORDER — ACETAMINOPHEN 500 MG PO TABS: 1000.0000 mg | ORAL_TABLET | Freq: Three times a day (TID) | ORAL | 0 refills | Status: DC | PRN

## 2022-12-12 MED ORDER — OXYCODONE HCL 5 MG PO TABS: 5.0000 mg | ORAL_TABLET | ORAL | 0 refills | Status: DC | PRN

## 2022-12-12 NOTE — Progress Notes (Signed)
    Subjective:  Patient reports pain as mild to moderate.  Denies N/V/CP/SOB/Abd pain. She states her pain is controlled with the medication. She denies any tinging or numbness in LE bilaterally.   Objective:   VITALS:   Vitals:   12/12/22 0049 12/12/22 0350 12/12/22 0747 12/12/22 1014  BP: (!) 152/80 (!) 142/96  131/89  Pulse: 79 (!) 55  (!) 56  Resp: 20 16  18   Temp: 98 F (36.7 C) 98 F (36.7 C)  (!) 97.5 F (36.4 C)  TempSrc: Oral   Oral  SpO2: 96% 97% 97% 97%  Weight:      Height:        Patient is lying in bed. NAD.  Neurologically intact ABD soft Neurovascular intact Sensation intact distally Intact pulses distally Dorsiflexion/Plantar flexion intact Incision: dressing C/D/I No cellulitis present Compartment soft   Lab Results  Component Value Date   WBC 9.1 12/12/2022   HGB 11.0 (L) 12/12/2022   HCT 35.7 (L) 12/12/2022   MCV 93.5 12/12/2022   PLT 192 12/12/2022   BMET    Component Value Date/Time   NA 134 (L) 12/12/2022 0319   NA 140 12/13/2021 1154   K 4.1 12/12/2022 0319   CL 101 12/12/2022 0319   CO2 24 12/12/2022 0319   GLUCOSE 236 (H) 12/12/2022 0319   BUN 21 (H) 12/12/2022 0319   BUN 23 12/13/2021 1154   CREATININE 1.01 (H) 12/12/2022 0319   CALCIUM 8.9 12/12/2022 0319   EGFR 48 (L) 12/13/2021 1154   GFRNONAA >60 12/12/2022 0319     Assessment/Plan: 1 Day Post-Op   Principal Problem:   Osteoarthritis of right knee Active Problems:   S/P total knee arthroplasty, right   WBAT with walker DVT ppx: Aspirin, SCDs, TEDS PO pain control PT/OT: PT has not seen yet. PT to come by today.  Dispo: D/c home with OPPT once cleared with PT and voided.    Clois Dupes, PA-C 12/12/2022, 11:34 AM   [facility]  [place] [address], [address], [place], [place] [address] Phone: [phone] [email or web address] Facebook  [employer]

## 2022-12-12 NOTE — Evaluation (Signed)
Physical Therapy Evaluation Patient Details Name: Maureen Swanson MRN: [id number] DOB: 01/20/1966 Today's Date: 12/12/2022  History of Present Illness  Pt s/p R TKR and with hx of L TKR, CHF, DM, and bipolar  Clinical Impression  Pt s/p R TKR and presents with decreased R LE strength/ROM and post op pain limiting functional mobility.  Pt should progress well to dc home with assist of friends and reports first OP PT scheduled for 12/14/24.     Recommendations for follow up therapy are one component of a multi-disciplinary discharge planning process, led by the attending physician.  Recommendations may be updated based on patient status, additional functional criteria and insurance authorization.  Follow Up Recommendations       Assistance Recommended at Discharge PRN  Patient can return home with the following  A little help with walking and/or transfers;A little help with bathing/dressing/bathroom;Assistance with cooking/housework;Assist for transportation;Help with stairs or ramp for entrance    Equipment Recommendations Rolling walker (2 wheels)  Recommendations for Other Services       Functional Status Assessment Patient has had a recent decline in their functional status and demonstrates the ability to make significant improvements in function in a reasonable and predictable amount of time.     Precautions / Restrictions Precautions Precautions: Knee Restrictions Weight Bearing Restrictions: No RLE Weight Bearing: Weight bearing as tolerated      Mobility  Bed Mobility Overal bed mobility: Needs Assistance Bed Mobility: Supine to Sit     Supine to sit: Min guard     General bed mobility comments: for safety    Transfers Overall transfer level: Modified independent Equipment used: Rolling walker (2 wheels)               General transfer comment: min guard with cues for LE management and use of UEs to self assist    Ambulation/Gait Ambulation/Gait  assistance: Min assist, Min guard Gait Distance (Feet): 75 Feet Assistive device: Rolling walker (2 wheels) Gait Pattern/deviations: Step-to pattern, Step-through pattern, Decreased step length - right, Decreased step length - left, Shuffle, Trunk flexed Gait velocity: decr     General Gait Details: cues for sequence, posture and position from [employer]            Wheelchair Mobility    Modified Rankin (Stroke Patients Only)       Balance Overall balance assessment: Mild deficits observed, not formally tested                                           Pertinent Vitals/Pain Pain Assessment Pain Assessment: 0-10 Pain Score: 5  Pain Location: R knee Pain Descriptors / Indicators: Aching, Sore Pain Intervention(s): Premedicated before session, Monitored during session, Limited activity within patient's tolerance, Ice applied    Home Living Family/patient expects to be discharged to:: Private residence Living Arrangements: Non-relatives/Friends Available Help at Discharge: Friend(s);Available 24 hours/day Type of Home: House Home Access: Stairs to enter Entrance Stairs-Rails: Doctor, general practice of Steps: 4   Home Layout: One level Home Equipment: None Additional Comments: Home above refers to friends home where pt plans to recover for 2 weeks    Prior Function Prior Level of Function : Independent/Modified Independent             Mobility Comments: IND       Hand Dominance   Dominant Hand: Left  Extremity/Trunk Assessment   Upper Extremity Assessment Upper Extremity Assessment: Overall WFL for tasks assessed    Lower Extremity Assessment Lower Extremity Assessment: RLE deficits/detail    Cervical / Trunk Assessment Cervical / Trunk Assessment: Normal  Communication   Communication: No difficulties  Cognition Arousal/Alertness: Awake/alert Behavior During Therapy: WFL for tasks assessed/performed Overall  Cognitive Status: Within Functional Limits for tasks assessed                                          General Comments      Exercises Total Joint Exercises Ankle Circles/Pumps: AROM, Both, 15 reps, Supine Quad Sets: AROM, Both, 10 reps, Supine Heel Slides: AAROM, Right, 15 reps, Supine Straight Leg Raises: AAROM, AROM, Right, 10 reps, Supine   Assessment/Plan    PT Assessment Patient needs continued PT services  PT Problem List Decreased strength;Decreased range of motion;Decreased activity tolerance;Decreased balance;Decreased mobility;Decreased knowledge of use of DME;Pain       PT Treatment Interventions DME instruction;Gait training;Stair training;Functional mobility training;Therapeutic activities;Therapeutic exercise;Patient/family education    PT Goals (Current goals can be found in the Care Plan section)  Acute Rehab PT Goals Patient Stated Goal: REgain IND PT Goal Formulation: With patient Time For Goal Achievement: 12/18/22 Potential to Achieve Goals: Good    Frequency 7X/week     Co-evaluation               AM-PAC PT "6 Clicks" Mobility  Outcome Measure Help needed turning from your back to your side while in a flat bed without using bedrails?: A Little Help needed moving from lying on your back to sitting on the side of a flat bed without using bedrails?: A Little Help needed moving to and from a bed to a chair (including a wheelchair)?: A Little Help needed standing up from a chair using your arms (e.g., wheelchair or bedside chair)?: A Little Help needed to walk in hospital room?: A Little Help needed climbing 3-5 steps with a railing? : A Lot 6 Click Score: 17    End of Session Equipment Utilized During Treatment: Gait belt Activity Tolerance: Patient tolerated treatment well Patient left: in chair;with call bell/phone within reach;with chair alarm set;with family/visitor present Nurse Communication: Mobility status PT Visit  Diagnosis: Difficulty in walking, not elsewhere classified (R26.2)    Time: 1610-9604 PT Time Calculation (min) (ACUTE ONLY): 28 min   Charges:   PT Evaluation $PT Eval Low Complexity: 1 Low PT Treatments $Therapeutic Exercise: 8-22 mins        Mauro Kaufmann PT Acute Rehabilitation Services Pager [phone] Office [phone]   Maureen Swanson 12/12/2022, 12:40 PM

## 2022-12-12 NOTE — TOC Initial Note (Signed)
Transition of Care [facility]) - Initial/Assessment Note    Patient Details  Name: Maureen Swanson MRN: [id number] Date of Birth: 22-Apr-1966  Transition of Care [facility]) CM/SW Contact:    Amada Jupiter, LCSW Phone Number: 12/12/2022, 11:21 AM  Clinical Narrative:                  Met briefly with pt who has needed DME in the home.  OPPT already arranged with [facility].  No TOC needs.   Barriers to Discharge: No Barriers Identified   Patient Goals and CMS Choice Patient states their goals for this hospitalization and ongoing recovery are:: return home          Expected Discharge Plan and Services                         DME Arranged: N/A DME Agency: NA                  Prior Living Arrangements/Services                       Activities of Daily Living Home Assistive Devices/Equipment: Eyeglasses ADL Screening (condition at time of admission) Patient's cognitive ability adequate to safely complete daily activities?: Yes Is the patient deaf or have difficulty hearing?: No Does the patient have difficulty seeing, even when wearing glasses/contacts?: No Does the patient have difficulty concentrating, remembering, or making decisions?: No Patient able to express need for assistance with ADLs?: Yes Does the patient have difficulty dressing or bathing?: No Independently performs ADLs?: Yes (appropriate for developmental age) Does the patient have difficulty walking or climbing stairs?: No Weakness of Legs: None Weakness of Arms/Hands: None  Permission Sought/Granted                  Emotional Assessment              Admission diagnosis:  S/P total knee arthroplasty, right [Z96.651] Patient Active Problem List   Diagnosis Date Noted   S/P total knee arthroplasty, right 12/11/2022   Type 2 diabetes mellitus with complication, with long-term current use of insulin (HCC) 11/23/2022   Hemorrhoids 04/02/2022   Aphthous ulcer of mouth 04/02/2022    Osteoarthritis of left knee 02/19/2022   OSA (obstructive sleep apnea) 12/31/2021   Acute gout of left foot 12/06/2019   CHF (congestive heart failure) (HCC) 08/03/2016   Cannabis use disorder, moderate, dependence (HCC) 11/20/2015   Homicidal ideation    Tobacco use disorder 07/20/2015   Diabetes mellitus (HCC) 07/20/2015   Cocaine use disorder, moderate, dependence (HCC) 04/03/2015   Schizoaffective disorder, bipolar type (HCC) 04/03/2015   Alcohol use disorder, moderate, dependence (HCC) 04/03/2015   Acute ischemic stroke (HCC) 12/08/2014   Gout attack 11/12/2014   Osteoarthritis of right knee 11/08/2014   Diabetic peripheral neuropathy (HCC) 11/04/2014   Mixed hyperlipidemia 11/04/2014   Small vessel disease, cerebrovascular 11/04/2014   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 [place] [address] Phone:  [phone] Fax: [phone]  [id number] [place], [place] - [address] AT [facility] [address] [place] [address] Phone: [phone] Fax: [phone]     Social Determinants of Health (SDOH) Social History: SDOH Screenings   Food Insecurity: No Food Insecurity (12/11/2022)  Housing: Low Risk  (12/11/2022)  Transportation Needs: No Transportation Needs (12/11/2022)  Utilities: Not At Risk (12/11/2022)  Alcohol Screen: Medium Risk (01/02/2021)  Tobacco Use: Medium Risk (12/12/2022)   SDOH Interventions:     Readmission Risk Interventions     No  data to display

## 2022-12-12 NOTE — Anesthesia Postprocedure Evaluation (Signed)
Anesthesia Post Note  Patient: MCKINZEY STURCH  Procedure(s) Performed: RIGHT COMPUTER ASSISTED TOTAL KNEE ARTHROPLASTY (Right: Knee)     Patient location during evaluation: PACU Anesthesia Type: Spinal Level of consciousness: oriented and awake and alert Pain management: pain level controlled Vital Signs Assessment: post-procedure vital signs reviewed and stable Respiratory status: spontaneous breathing, respiratory function stable and patient connected to nasal cannula oxygen Cardiovascular status: blood pressure returned to baseline and stable Postop Assessment: no headache, no backache and no apparent nausea or vomiting Anesthetic complications: no   No notable events documented.               Shelton Silvas

## 2022-12-12 NOTE — Progress Notes (Signed)
Physical Therapy Treatment Patient Details Name: Maureen Swanson MRN: [id number] DOB: 25-Nov-1965 Today's Date: 12/12/2022   History of Present Illness Pt s/p R TKR and with hx of L TKR, CHF, DM, and bipolar    PT Comments    Pt continues very cooperative and progressing well with mobility.  Pt up to ambulate in hall, negotiated stairs and reviewed written HEP program.  Pt states feels comfortable with dc this date and level of assist at home.  First OP PT 12/15/22.   Recommendations for follow up therapy are one component of a multi-disciplinary discharge planning process, led by the attending physician.  Recommendations may be updated based on patient status, additional functional criteria and insurance authorization.  Follow Up Recommendations       Assistance Recommended at Discharge PRN  Patient can return home with the following A little help with walking and/or transfers;A little help with bathing/dressing/bathroom;Assistance with cooking/housework;Assist for transportation;Help with stairs or ramp for entrance   Equipment Recommendations  Rolling walker (2 wheels)    Recommendations for Other Services       Precautions / Restrictions Precautions Precautions: Knee Restrictions Weight Bearing Restrictions: No RLE Weight Bearing: Weight bearing as tolerated     Mobility  Bed Mobility Overal bed mobility: Needs Assistance Bed Mobility: Supine to Sit     Supine to sit: Supervision     General bed mobility comments: Increased time but no physical assist    Transfers Overall transfer level: Needs assistance Equipment used: Rolling walker (2 wheels) Transfers: Sit to/from Stand Sit to Stand: Supervision           General transfer comment: min cues for LE management and use of UEs to self assist    Ambulation/Gait Ambulation/Gait assistance: Min guard, Supervision Gait Distance (Feet): 65 Feet Assistive device: Rolling walker (2 wheels) Gait Pattern/deviations:  Step-to pattern, Step-through pattern, Decreased step length - right, Decreased step length - left, Shuffle, Trunk flexed Gait velocity: decr     General Gait Details: cues for sequence, posture and position from RW   Stairs Stairs: Yes Stairs assistance: Min assist Stair Management: One rail Right, Step to pattern, Forwards, With crutches Number of Stairs: 7 General stair comments: cues for sequence and foot/crutch placement   Wheelchair Mobility    Modified Rankin (Stroke Patients Only)       Balance Overall balance assessment: Mild deficits observed, not formally tested                                          Cognition Arousal/Alertness: Awake/alert Behavior During Therapy: WFL for tasks assessed/performed Overall Cognitive Status: Within Functional Limits for tasks assessed                                          Exercises Total Joint Exercises Ankle Circles/Pumps: AROM, Both, 15 reps, Supine Quad Sets: AROM, Both, 10 reps, Supine Heel Slides: AAROM, Right, 15 reps, Supine Straight Leg Raises: AAROM, AROM, Right, 10 reps, Supine    General Comments        Pertinent Vitals/Pain Pain Assessment Pain Assessment: 0-10 Pain Score: 7  Pain Location: R knee Pain Descriptors / Indicators: Aching, Sore Pain Intervention(s): Limited activity within patient's tolerance, Monitored during session, Premedicated before session, Patient requesting pain meds-RN notified,  Ice applied    Home Living Family/patient expects to be discharged to:: Private residence Living Arrangements: Non-relatives/Friends Available Help at Discharge: Friend(s);Available 24 hours/day Type of Home: House Home Access: Stairs to enter Entrance Stairs-Rails: Doctor, general practice of Steps: 4   Home Layout: One level Home Equipment: None Additional Comments: Home above refers to friends home where pt plans to recover for 2 weeks    Prior  Function            PT Goals (current goals can now be found in the care plan section) Acute Rehab PT Goals Patient Stated Goal: REgain IND PT Goal Formulation: With patient Time For Goal Achievement: 12/18/22 Potential to Achieve Goals: Good Progress towards PT goals: Progressing toward goals    Frequency    7X/week      PT Plan Current plan remains appropriate    Co-evaluation              AM-PAC PT "6 Clicks" Mobility   Outcome Measure  Help needed turning from your back to your side while in a flat bed without using bedrails?: A Little Help needed moving from lying on your back to sitting on the side of a flat bed without using bedrails?: A Little Help needed moving to and from a bed to a chair (including a wheelchair)?: A Little Help needed standing up from a chair using your arms (e.g., wheelchair or bedside chair)?: A Little Help needed to walk in hospital room?: A Little Help needed climbing 3-5 steps with a railing? : A Little 6 Click Score: 18    End of Session Equipment Utilized During Treatment: Gait belt Activity Tolerance: Patient tolerated treatment well Patient left: in chair;with call bell/phone within reach;with chair alarm set;with family/visitor present Nurse Communication: Mobility status PT Visit Diagnosis: Difficulty in walking, not elsewhere classified (R26.2)     Time: 1310-1345 PT Time Calculation (min) (ACUTE ONLY): 35 min  Charges:  $Gait Training: 8-22 mins $Therapeutic Exercise: 8-22 mins $Therapeutic Activity: 8-22 mins                     Mauro Kaufmann PT Acute Rehabilitation Services Pager [phone] Office [phone]    Keishana Klinger 12/12/2022, 3:21 PM

## 2022-12-15 NOTE — Discharge Summary (Signed)
Physician Discharge Summary  Patient ID: Maureen Swanson MRN: [id number] DOB/AGE: [id number] y.o.  Admit date: 12/11/2022 Discharge date: 12/12/2022  Admission Diagnoses:  Osteoarthritis of right knee  Discharge Diagnoses:  Principal Problem:   Osteoarthritis of right knee Active Problems:   S/P total knee arthroplasty, right   Past Medical History:  Diagnosis Date   Anxiety    Arthritis    back and knees   Asthma    daily and prn inhalers   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 x 12 yrs.   Sleep apnea    Stroke (HCC)    1995   Mild. no residual problems   Substance abuse (HCC)    crack cocaine:  extensive treatment with [facility] [place] from 2014 to 2018 for this and alcohol abuse.   TMJ (temporomandibular joint disorder)     Surgeries: Procedure(s): RIGHT COMPUTER ASSISTED TOTAL KNEE ARTHROPLASTY on 12/11/2022   Consultants (if any):   Discharged Condition: Improved  Hospital Course: Maureen Swanson is an 57 y.o. female who was admitted 12/11/2022 with a diagnosis of Osteoarthritis of right knee and went to the operating room on 12/11/2022 and underwent the above named procedures.    She was given perioperative antibiotics:  Anti-infectives (From admission, onward)    Start     Dose/Rate Route Frequency Ordered Stop   12/11/22 1830  ceFAZolin (ANCEF) IVPB 2g/100 mL premix        2 g 200 mL/hr over 30 Minutes Intravenous Every 6 hours 12/11/22 1625 12/12/22 0105   12/11/22 0930  ceFAZolin (ANCEF) IVPB 2g/100 mL premix        2 g 200 mL/hr over 30 Minutes Intravenous On call to O.R. 12/11/22 0915 12/11/22 1247       She was given  sequential compression devices, early ambulation, and aspirin for DVT prophylaxis.  POD#! She ambulated 75 and 65 feet with PT. She was discharged home with OPPT.   She benefited maximally from the hospital stay and there were no complications.    Recent vital signs:  Vitals:   12/12/22 1014 12/12/22 1332  BP: 131/89 128/89  Pulse: (!) 56 (!) 58  Resp: 18 18  Temp: (!) 97.5 F (36.4 C) 97.7 F (36.5 C)  SpO2: 97% 99%    Recent laboratory studies:  Lab Results  Component Value Date   HGB 11.0 (L) 12/12/2022   HGB 12.1 11/28/2022   HGB 10.5 (L) 09/13/2022   Lab Results  Component Value Date   WBC 9.1 12/12/2022   PLT 192 12/12/2022   Lab Results  Component Value Date   INR 1.02 12/08/2014   Lab Results  Component Value Date   NA 134 (L) 12/12/2022   K 4.1 12/12/2022   CL 101 12/12/2022   CO2 24 12/12/2022   BUN 21 (H) 12/12/2022   CREATININE 1.01 (H) 12/12/2022   GLUCOSE 236 (H) 12/12/2022     Allergies as of 12/12/2022       Reactions   Chocolate Hives   Penicillins Hives, Other (See Comments)  Orange Hives   Strawberry Extract Swelling   Tomato Hives, Other (See Comments)   "acid foods"        Medication List     STOP taking these medications    diclofenac Sodium 1 % Gel Commonly known as: VOLTAREN       TAKE these medications    Abilify Maintena 400 MG Prsy prefilled syringe Generic drug: ARIPiprazole ER Inject 400 mg into the muscle every 30 (thirty) days. Last injection given on 01/10/2021. Next injection due 02/07/2021   Accu-Chek Aviva Plus test strip Generic drug: glucose blood Check blood glucose twice daily before meals   Accu-Chek Aviva Plus w/Device Kit Check blood glucose twice daily before meals   Accu-Chek Softclix Lancets lancets Use as instructed   acetaminophen 500 MG tablet Commonly known as: TYLENOL Take 2 tablets (1,000 mg total) by mouth every 8 (eight) hours as needed for moderate pain, mild pain, fever or  headache. What changed:  when to take this reasons to take this   amLODipine 10 MG tablet Commonly known as: NORVASC Take 1 tablet (10 mg total) by mouth daily. 1 tab by mouth daily   aspirin 81 MG chewable tablet Commonly known as: Aspirin Childrens Chew 1 tablet (81 mg total) by mouth 2 (two) times daily with a meal.   atenolol 100 MG tablet Commonly known as: TENORMIN TAKE ONE TABLET BY MOUTH DAILY   atorvastatin 40 MG tablet Commonly known as: LIPITOR Take 1 tablet (40 mg total) by mouth at bedtime.   Blood Pressure Kit Devi Check blood pressure 3 times weekly   cetirizine 10 MG tablet Commonly known as: ZYRTEC TAKE ONE TABLET BY MOUTH DAILY FOR ALLERGIES AS NEEDED What changed: See the new instructions.   cyclobenzaprine 5 MG tablet Commonly known as: FLEXERIL TAKE ONE TABLET BY MOUTH AT BEDTIME AS NEEDED What changed: reasons to take this   Dexcom G7 Sensor Misc Check glucose before each meal and at bedtime or if symptomatic   docusate sodium 100 MG capsule Commonly known as: Colace Take 1 capsule (100 mg total) by mouth 2 (two) times daily.   empagliflozin 10 MG Tabs tablet Commonly known as: Jardiance Take 1 tablet (10 mg total) by mouth daily before breakfast.   fluticasone-salmeterol 100-50 MCG/ACT Aepb Commonly known as: ADVAIR Inhale 1 puff into the lungs 2 (two) times daily.   gabapentin 300 MG capsule Commonly known as: NEURONTIN Take 1 capsule (300 mg total) by mouth 3 (three) times daily.   hydrochlorothiazide 25 MG tablet Commonly known as: HYDRODIURIL TAKE ONE TABLET BY MOUTH DAILY IN THE MORNING WITH BREAKFAST   Ingrezza 80 MG capsule Generic drug: valbenazine Take 80 mg by mouth at bedtime.   lamoTRIgine 100 MG tablet Commonly known as: LAMICTAL Take 100 mg by mouth daily.   losartan 100 MG tablet Commonly known as: COZAAR TAKE ONE TABLET BY MOUTH DAILY IN THE MORNING   magic mouthwash (nystatin, lidocaine, diphenhydrAMINE, alum  & mag hydroxide) suspension Swish and spit 5 mLs 4 (four) times daily. What changed:  when to take this reasons to take this   melatonin 3 MG Tabs tablet Take 1 tablet (3 mg total) by mouth at bedtime.   meloxicam 15 MG tablet Commonly known as: MOBIC Take 1 tablet (15 mg total) by mouth daily with breakfast.   metFORMIN 1000 MG tablet Commonly known as: GLUCOPHAGE TAKE ONE TABLET BY MOUTH TWICE A DAY WITH MEAL   multivitamin with minerals Tabs tablet Take 1  tablet by mouth daily.   omeprazole 40 MG capsule Commonly known as: PRILOSEC TAKE ONE CAPSULE BY MOUTH DAILY IN THE MORNING HALF HOUR BEFORE BREAKFAST AND OTHER MEDS   ondansetron 4 MG tablet Commonly known as: Zofran Take 1 tablet (4 mg total) by mouth every 8 (eight) hours as needed for nausea or vomiting.   oxyCODONE 5 MG immediate release tablet Commonly known as: Roxicodone Take 1 tablet (5 mg total) by mouth every 4 (four) hours as needed for severe pain or moderate pain.   polyethylene glycol 17 g packet Commonly known as: MiraLax Take 17 g by mouth daily as needed for mild constipation or moderate constipation.   prazosin 1 MG capsule Commonly known as: MINIPRESS Take 1 mg by mouth at bedtime.   senna 8.6 MG Tabs tablet Commonly known as: SENOKOT Take 2 tablets (17.2 mg total) by mouth at bedtime for 15 days.   trazodone 300 MG tablet Commonly known as: DESYREL Take 300 mg by mouth at bedtime.   Ventolin HFA 108 (90 Base) MCG/ACT inhaler Generic drug: albuterol INHALE TWO PUFFS BY MOUTH EVERY SIX HOURS AS NEEDED FOR SHORTNESS OF BREATH               Discharge Care Instructions  (From admission, onward)           Start     Ordered   12/12/22 0000  Weight bearing as tolerated        12/12/22 1138   12/12/22 0000  Change dressing       Comments: Do not remove your dressing.   12/12/22 1138              WEIGHT BEARING   Weight bearing as tolerated with assist device (walker,  cane, etc) as directed, use it as long as suggested by your surgeon or therapist, typically at least 4-6 weeks.   EXERCISES  Results after joint replacement surgery are often greatly improved when you follow the exercise, range of motion and muscle strengthening exercises prescribed by your doctor. Safety measures are also important to protect the joint from further injury. Any time any of these exercises cause you to have increased pain or swelling, decrease what you are doing until you are comfortable again and then slowly increase them. If you have problems or questions, call your caregiver or physical therapist for advice.   Rehabilitation is important following a joint replacement. After just a few days of immobilization, the muscles of the leg can become weakened and shrink (atrophy).  These exercises are designed to build up the tone and strength of the thigh and leg muscles and to improve motion. Often times heat used for twenty to thirty minutes before working out will loosen up your tissues and help with improving the range of motion but do not use heat for the first two weeks following surgery (sometimes heat can increase post-operative swelling).   These exercises can be done on a training (exercise) mat, on the floor, on a table or on a bed. Use whatever works the best and is most comfortable for you.    Use music or television while you are exercising so that the exercises are a pleasant break in your day. This will make your life better with the exercises acting as a break in your routine that you can look forward to.   Perform all exercises about fifteen times, three times per day or as directed.  You should exercise both the operative leg and  the other leg as well.  Exercises include:   Quad Sets - Tighten up the muscle on the front of the thigh (Quad) and hold for 5-10 seconds.   Straight Leg Raises - With your knee straight (if you were given a brace, keep it on), lift the leg to 60  degrees, hold for 3 seconds, and slowly lower the leg.  Perform this exercise against resistance later as your leg gets stronger.  Leg Slides: Lying on your back, slowly slide your foot toward your buttocks, bending your knee up off the floor (only go as far as is comfortable). Then slowly slide your foot back down until your leg is flat on the floor again.  Angel Wings: Lying on your back spread your legs to the side as far apart as you can without causing discomfort.  Hamstring Strength:  Lying on your back, push your heel against the floor with your leg straight by tightening up the muscles of your buttocks.  Repeat, but this time bend your knee to a comfortable angle, and push your heel against the floor.  You may put a pillow under the heel to make it more comfortable if necessary.   A rehabilitation program following joint replacement surgery can speed recovery and prevent re-injury in the future due to weakened muscles. Contact your doctor or a physical therapist for more information on knee rehabilitation.    CONSTIPATION  Constipation is defined medically as fewer than three stools per week and severe constipation as less than one stool per week.  Even if you have a regular bowel pattern at home, your normal regimen is likely to be disrupted due to multiple reasons following surgery.  Combination of anesthesia, postoperative narcotics, change in appetite and fluid intake all can affect your bowels.   YOU MUST use at least one of the following options; they are listed in order of increasing strength to get the job done.  They are all available over the counter, and you may need to use some, POSSIBLY even all of these options:    Drink plenty of fluids (prune juice may be helpful) and high fiber foods Colace 100 mg by mouth twice a day  Senokot for constipation as directed and as needed Dulcolax (bisacodyl), take with full glass of water  Miralax (polyethylene glycol) once or twice a day as  needed.  If you have tried all these things and are unable to have a bowel movement in the first 3-4 days after surgery call either your surgeon or your primary doctor.    If you experience loose stools or diarrhea, hold the medications until you stool forms back up.  If your symptoms do not get better within 1 week or if they get worse, check with your doctor.  If you experience "the worst abdominal pain ever" or develop nausea or vomiting, please contact the office immediately for further recommendations for treatment.   ITCHING:  If you experience itching with your medications, try taking only a single pain pill, or even half a pain pill at a time.  You can also use Benadryl over the counter for itching or also to help with sleep.   TED HOSE STOCKINGS:  Use stockings on both legs until for at least 2 weeks or as directed by physician office. They may be removed at night for sleeping.  MEDICATIONS:  See your medication summary on the "After Visit Summary" that nursing will review with you.  You may have some home medications which  will be placed on hold until you complete the course of blood thinner medication.  It is important for you to complete the blood thinner medication as prescribed.  PRECAUTIONS:  If you experience chest pain or shortness of breath - call 911 immediately for transfer to the hospital emergency department.   If you develop a fever greater that 101 F, purulent drainage from wound, increased redness or drainage from wound, foul odor from the wound/dressing, or calf pain - CONTACT YOUR SURGEON.                                                   FOLLOW-UP APPOINTMENTS:  If you do not already have a post-op appointment, please call the office for an appointment to be seen by your surgeon.  Guidelines for how soon to be seen are listed in your "After Visit Summary", but are typically between 1-4 weeks after surgery.  OTHER INSTRUCTIONS:   Knee Replacement:  Do not place pillow  under knee, focus on keeping the knee straight while resting. CPM instructions: 0-90 degrees, 2 hours in the morning, 2 hours in the afternoon, and 2 hours in the evening. Place foam block, curve side up under heel at all times except when in CPM or when walking.  DO NOT modify, tear, cut, or change the foam block in any way.   MAKE SURE YOU:  Understand these instructions.  Get help right away if you are not doing well or get worse.    Thank you for letting us be a part of your medical care team.  It is a privilege we respect greatly.  We hope these instructions will help you stay on track for a fast and full recovery!   Diagnostic Studies: DG Knee Right Port  Result Date: 12/11/2022 CLINICAL DATA:  [id number] S/P total knee arthroplasty, right [id number] EXAM: PORTABLE RIGHT KNEE - 1-2 VIEW COMPARISON:  12/06/2020 FINDINGS: Interval postsurgical changes from right total knee arthroplasty. Arthroplasty components are in their expected alignment. No periprosthetic fracture or evidence of other complication. Expected postoperative changes within the overlying soft tissues. IMPRESSION: Interval postsurgical changes from right total knee arthroplasty without evidence of postoperative complication. Electronically Signed   By: Duanne Guess D.O.   On: 12/11/2022 15:53    Disposition: Discharge disposition: 01-Home or Self Care       Discharge Instructions     Call MD / Call 911   Complete by: As directed    If you experience chest pain or shortness of breath, CALL 911 and be transported to the hospital emergency room.  If you develope a fever above 101 F, pus (white drainage) or increased drainage or redness at the wound, or calf pain, call your surgeon's office.   Change dressing   Complete by: As directed    Do not remove your dressing.   Constipation Prevention   Complete by: As directed    Drink plenty of fluids.  Prune juice may be helpful.  You may use a stool softener, such as Colace  (over the counter) 100 mg twice a day.  Use MiraLax (over the counter) for constipation as needed.   Diet - low sodium heart healthy   Complete by: As directed    Discharge instructions   Complete by: As directed    Elevate toes above nose. Use cryotherapy as  needed for pain and swelling.   Do not put a pillow under the knee. Place it under the heel.   Complete by: As directed    Driving restrictions   Complete by: As directed    No driving for 6 weeks   Increase activity slowly as tolerated   Complete by: As directed    Lifting restrictions   Complete by: As directed    No lifting for 6 weeks   Post-operative opioid taper instructions:   Complete by: As directed    POST-OPERATIVE OPIOID TAPER INSTRUCTIONS: It is important to wean off of your opioid medication as soon as possible. If you do not need pain medication after your surgery it is ok to stop day one. Opioids include: Codeine, Hydrocodone(Norco, Vicodin), Oxycodone(Percocet, oxycontin) and hydromorphone amongst others.  Long term and even short term use of opiods can cause: Increased pain response Dependence Constipation Depression Respiratory depression And more.  Withdrawal symptoms can include Flu like symptoms Nausea, vomiting And more Techniques to manage these symptoms Hydrate well Eat regular healthy meals Stay active Use relaxation techniques(deep breathing, meditating, yoga) Do Not substitute Alcohol to help with tapering If you have been on opioids for less than two weeks and do not have pain than it is ok to stop all together.  Plan to wean off of opioids This plan should start within one week post op of your joint replacement. Maintain the same interval or time between taking each dose and first decrease the dose.  Cut the total daily intake of opioids by one tablet each day Next start to increase the time between doses. The last dose that should be eliminated is the evening dose.      TED hose    Complete by: As directed    Use stockings (TED hose) for 2 weeks on both leg(s).  You may remove them at night for sleeping.   Weight bearing as tolerated   Complete by: As directed         Follow-up Information     Clois Dupes, PA-C. Schedule an appointment as soon as possible for a visit.   Specialty: Orthopedic Surgery Why: For suture removal, For wound re-check Contact information: [address], [address] [place] [address] [phone]                  Signed: Clois Dupes 12/15/2022, 7:38 AM

## 2022-12-16 ENCOUNTER — Other Ambulatory Visit: Payer: Self-pay | Admitting: Internal Medicine

## 2023-01-12 ENCOUNTER — Other Ambulatory Visit: Payer: MEDICAID

## 2023-01-13 ENCOUNTER — Other Ambulatory Visit: Payer: MEDICAID

## 2023-01-13 DIAGNOSIS — E118 Type 2 diabetes mellitus with unspecified complications: Secondary | ICD-10-CM

## 2023-01-13 DIAGNOSIS — Z79899 Other long term (current) drug therapy: Secondary | ICD-10-CM

## 2023-01-13 DIAGNOSIS — E782 Mixed hyperlipidemia: Secondary | ICD-10-CM

## 2023-01-15 ENCOUNTER — Ambulatory Visit: Payer: MEDICAID | Admitting: Internal Medicine

## 2023-01-18 ENCOUNTER — Encounter (HOSPITAL_COMMUNITY): Payer: Self-pay

## 2023-01-18 ENCOUNTER — Emergency Department (HOSPITAL_BASED_OUTPATIENT_CLINIC_OR_DEPARTMENT_OTHER): Payer: MEDICAID

## 2023-01-18 ENCOUNTER — Emergency Department (HOSPITAL_COMMUNITY): Payer: MEDICAID

## 2023-01-18 ENCOUNTER — Emergency Department (HOSPITAL_COMMUNITY)
Admission: EM | Admit: 2023-01-18 | Discharge: 2023-01-18 | Disposition: A | Discharge status: Home / Self Care | Payer: MEDICAID | Attending: Emergency Medicine | Admitting: Emergency Medicine

## 2023-01-18 DIAGNOSIS — Z7984 Long term (current) use of oral hypoglycemic drugs: Secondary | ICD-10-CM | POA: Insufficient documentation

## 2023-01-18 DIAGNOSIS — G8918 Other acute postprocedural pain: Secondary | ICD-10-CM

## 2023-01-18 DIAGNOSIS — Z7982 Long term (current) use of aspirin: Secondary | ICD-10-CM | POA: Insufficient documentation

## 2023-01-18 DIAGNOSIS — M79661 Pain in right lower leg: Secondary | ICD-10-CM | POA: Diagnosis not present

## 2023-01-18 DIAGNOSIS — Z20822 Contact with and (suspected) exposure to covid-19: Secondary | ICD-10-CM | POA: Diagnosis not present

## 2023-01-18 DIAGNOSIS — Z96651 Presence of right artificial knee joint: Secondary | ICD-10-CM | POA: Diagnosis not present

## 2023-01-18 DIAGNOSIS — M25461 Effusion, right knee: Secondary | ICD-10-CM | POA: Insufficient documentation

## 2023-01-18 DIAGNOSIS — Z79899 Other long term (current) drug therapy: Secondary | ICD-10-CM | POA: Insufficient documentation

## 2023-01-18 LAB — CBC WITH DIFFERENTIAL/PLATELET
Abs Immature Granulocytes: 0.02 10*3/uL (ref 0.00–0.07)
Basophils Absolute: 0 10*3/uL (ref 0.0–0.1)
Basophils Relative: 0 %
Eosinophils Absolute: 0 10*3/uL (ref 0.0–0.5)
Eosinophils Relative: 1 %
HCT: 39.5 % (ref 36.0–46.0)
Hemoglobin: 12.1 g/dL (ref 12.0–15.0)
Immature Granulocytes: 0 %
Lymphocytes Relative: 20 %
Lymphs Abs: 1.1 10*3/uL (ref 0.7–4.0)
MCH: 28.3 pg (ref 26.0–34.0)
MCHC: 30.6 g/dL (ref 30.0–36.0)
MCV: 92.5 fL (ref 80.0–100.0)
Monocytes Absolute: 0.3 10*3/uL (ref 0.1–1.0)
Monocytes Relative: 5 %
Neutro Abs: 4 10*3/uL (ref 1.7–7.7)
Neutrophils Relative %: 74 %
Platelets: 219 10*3/uL (ref 150–400)
RBC: 4.27 MIL/uL (ref 3.87–5.11)
RDW: 12.7 % (ref 11.5–15.5)
WBC: 5.5 10*3/uL (ref 4.0–10.5)
nRBC: 0 % (ref 0.0–0.2)

## 2023-01-18 LAB — BASIC METABOLIC PANEL
Anion gap: 14 (ref 5–15)
BUN: 12 mg/dL (ref 6–20)
CO2: 25 mmol/L (ref 22–32)
Calcium: 9.8 mg/dL (ref 8.9–10.3)
Chloride: 100 mmol/L (ref 98–111)
Creatinine, Ser: 0.8 mg/dL (ref 0.44–1.00)
GFR, Estimated: >60 mL/min (ref >60)
Glucose, Bld: 151 mg/dL — ABNORMAL HIGH (ref 70–99)
Potassium: 4.2 mmol/L (ref 3.5–5.1)
Sodium: 139 mmol/L (ref 135–145)

## 2023-01-18 LAB — SEDIMENTATION RATE: Sed Rate: 27 mm/hr — ABNORMAL HIGH (ref 0–22)

## 2023-01-18 LAB — SARS CORONAVIRUS 2 BY RT PCR: SARS Coronavirus 2 by RT PCR: NEGATIVE

## 2023-01-18 LAB — C-REACTIVE PROTEIN: CRP: 0.8 mg/dL (ref <1.0)

## 2023-01-18 MED ORDER — MORPHINE SULFATE (PF) 4 MG/ML IV SOLN
4.0000 mg | Freq: Once | INTRAVENOUS | Status: AC | PRN
  Administered 2023-01-18: 4 mg via INTRAVENOUS
  Filled 2023-01-18: qty 1

## 2023-01-18 MED ORDER — KETOROLAC TROMETHAMINE 15 MG/ML IJ SOLN
15.0000 mg | Freq: Once | INTRAMUSCULAR | Status: AC
  Administered 2023-01-18: 15 mg via INTRAVENOUS
  Filled 2023-01-18: qty 1

## 2023-01-18 MED ORDER — OXYCODONE-ACETAMINOPHEN 5-325 MG PO TABS
1.0000 | ORAL_TABLET | Freq: Once | ORAL | Status: AC
  Administered 2023-01-18: 1 via ORAL
  Filled 2023-01-18: qty 1

## 2023-01-18 MED ORDER — OXYCODONE HCL 5 MG PO TABS: 5.0000 mg | ORAL_TABLET | Freq: Four times a day (QID) | ORAL | 0 refills | Status: DC | PRN

## 2023-01-18 NOTE — Discharge Instructions (Addendum)
Call your orthopedic doctor at the office tomorrow and let them know that you are having worsening pain in your knee, and request a faster follow-up appointment  At home you can try to keep your leg elevated on the couch.  You can put ice or cooling packs for 10 minutes at a time on your knee as needed.  In the short-term I recommended that you can double your dose of oxycodone from 5 mg to 10 mg every 6 hours as needed for pain.  I sent an additional short-term prescription to your pharmacy

## 2023-01-18 NOTE — ED Triage Notes (Signed)
Pt BIB [facility] from home d/t post-op knee problem. C/o generalized weakness & generalized shakiness. Tardive at baseline. Just had knee replacement abt 3 weeks ago. Pt stated her knee is swollen. A&O X4. Ambulatory w cane. VSS.

## 2023-01-18 NOTE — ED Provider Notes (Signed)
[facility] EMERGENCY DEPARTMENT AT [facility] Provider Note   CSN: [id number] Arrival date & time: 01/18/23  7829     History  Chief Complaint  Patient presents with   Post-op Problem    Maureen Swanson is a 57 y.o. 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 Samson Frederic presenting to ED with complaint of shakiness, reported knee swelling.  The patient reports she was doing okay with her knee until about 1 week ago, where she reports she has been having worsening pain, swelling in her right knee.  She says she has had difficulty bearing any weight on it for the past day or 2.  She reports some chills at home.  She reports that she was on aspirin for several weeks as DVT prophylaxis after surgery but she finished that course.  She denies any pain behind her leg.  Reports next ortho appointment is 01/26/23  HPI     Home Medications Prior to Admission medications   Medication Sig Start Date End Date Taking? Authorizing Provider  oxyCODONE (ROXICODONE) 5 MG immediate release tablet Take 1 tablet (5 mg total) by mouth every 6 (six) hours as needed for up to 12 doses for severe pain. 01/18/23  Yes , Kermit Balo, MD  ABILIFY MAINTENA 400 MG PRSY prefilled syringe Inject 400 mg into the muscle every 30 (thirty) days. Last injection given on 01/10/2021. Next injection due 02/07/2021 01/09/21   Laveda Abbe, NP  Accu-Chek Softclix Lancets lancets Use as instructed 06/06/22   Peter Garter, PA  acetaminophen (TYLENOL) 500 MG tablet Take 2 tablets (1,000 mg total) by mouth every 8 (eight) hours as needed for moderate pain, mild pain, fever or headache. 12/12/22   Hill, Avery S, PA-C  ADVAIR DISKUS 100-50 MCG/ACT AEPB INHALE 1 PUFF BY MOUTH EVERY 12 HOURS AND RINSE MOUTH AFTER USE 12/19/22   Julieanne Manson, MD  amLODipine (NORVASC) 10 MG tablet Take 1 tablet (10 mg total) by mouth daily. 1 tab by mouth daily 10/13/22   Julieanne Manson, MD  aspirin  (ASPIRIN CHILDRENS) 81 MG chewable tablet Chew 1 tablet (81 mg total) by mouth 2 (two) times daily with a meal. 12/12/22 01/26/23  Hill, Alain Honey, PA-C  atenolol (TENORMIN) 100 MG tablet TAKE ONE TABLET BY MOUTH DAILY 08/29/22   Julieanne Manson, MD  atorvastatin (LIPITOR) 40 MG tablet Take 1 tablet (40 mg total) by mouth at bedtime. 10/13/22   Julieanne Manson, MD  Blood Glucose Monitoring Suppl (ACCU-CHEK AVIVA PLUS) w/Device KIT Check blood glucose twice daily before meals 05/23/22   Julieanne Manson, MD  Blood Pressure Monitoring (BLOOD PRESSURE KIT) DEVI Check blood pressure 3 times weekly 10/13/22   Julieanne Manson, MD  cetirizine (ZYRTEC) 10 MG tablet TAKE ONE TABLET BY MOUTH DAILY FOR ALLERGIES AS NEEDED Patient taking differently: Take 10 mg by mouth daily. 08/29/22   Julieanne Manson, MD  Continuous Glucose Sensor (DEXCOM G7 SENSOR) MISC Check glucose before each meal and at bedtime or if symptomatic 10/13/22   Julieanne Manson, MD  cyclobenzaprine (FLEXERIL) 5 MG tablet TAKE ONE TABLET BY MOUTH AT BEDTIME AS NEEDED 12/19/22   Julieanne Manson, MD  diclofenac Sodium (VOLTAREN) 1 % GEL APPLY TWO GRAMS TO THE KNEES UP TO FOUR TIMES A DAY AS NEEDED FOR PAIN 12/19/22   Julieanne Manson, MD  empagliflozin (JARDIANCE) 10 MG TABS tablet Take 1 tablet (10 mg total) by mouth daily before breakfast. 10/13/22   Julieanne Manson,  MD  gabapentin (NEURONTIN) 300 MG capsule Take 1 capsule (300 mg total) by mouth 3 (three) times daily. 10/13/22   Julieanne Manson, MD  glucose blood (ACCU-CHEK AVIVA PLUS) test strip Check blood glucose twice daily before meals 05/23/22   Julieanne Manson, MD  hydrochlorothiazide (HYDRODIURIL) 25 MG tablet TAKE ONE TABLET BY MOUTH DAILY IN THE MORNING WITH BREAKFAST 08/10/22   Julieanne Manson, MD  lamoTRIgine (LAMICTAL) 100 MG tablet Take 100 mg by mouth daily. 05/14/21   [provider]  losartan (COZAAR) 100 MG tablet TAKE ONE TABLET BY MOUTH  DAILY IN THE MORNING 08/10/22   Julieanne Manson, MD  magic mouthwash (nystatin, lidocaine, diphenhydrAMINE, alum & mag hydroxide) suspension Swish and spit 5 mLs 4 (four) times daily. Patient taking differently: Swish and spit 5 mLs 4 (four) times daily as needed for mouth pain. 10/13/22   Julieanne Manson, MD  melatonin 3 MG TABS tablet Take 1 tablet (3 mg total) by mouth at bedtime. 01/09/21   Laveda Abbe, NP  metFORMIN (GLUCOPHAGE) 1000 MG tablet TAKE ONE TABLET BY MOUTH TWICE A DAY WITH MEAL 08/29/22   Julieanne Manson, MD  Multiple Vitamin (MULTIVITAMIN WITH MINERALS) TABS tablet Take 1 tablet by mouth daily.    [provider]  omeprazole (PRILOSEC) 40 MG capsule TAKE ONE CAPSULE BY MOUTH DAILY IN THE MORNING HALF HOUR BEFORE BREAKFAST AND OTHER MEDS 08/10/22   Julieanne Manson, MD  ondansetron (ZOFRAN) 4 MG tablet Take 1 tablet (4 mg total) by mouth every 8 (eight) hours as needed for nausea or vomiting. 12/12/22 12/12/23  Clois Dupes, PA-C  oxyCODONE (ROXICODONE) 5 MG immediate release tablet Take 1 tablet (5 mg total) by mouth every 4 (four) hours as needed for severe pain or moderate pain. 12/12/22   Clois Dupes, PA-C  prazosin (MINIPRESS) 1 MG capsule Take 1 mg by mouth at bedtime.    [provider]  trazodone (DESYREL) 300 MG tablet Take 300 mg by mouth at bedtime.    [provider]  valbenazine (INGREZZA) 80 MG capsule Take 80 mg by mouth at bedtime.    [provider]  VENTOLIN HFA 108 (90 Base) MCG/ACT inhaler INHALE TWO PUFFS BY MOUTH EVERY SIX HOURS AS NEEDED FOR SHORTNESS OF BREATH 12/19/22   Julieanne Manson, MD      Allergies    Chocolate, Penicillins, Orange, Strawberry extract, and Tomato    Review of Systems   Review of Systems  Physical Exam Updated Vital Signs BP (!) 170/117   Pulse 84   Temp 98.5 F (36.9 C) (Oral)   Resp 20   SpO2 97%  Physical Exam Constitutional:      General: She is not in acute  distress. HENT:     Head: Normocephalic and atraumatic.  Eyes:     Conjunctiva/sclera: Conjunctivae normal.     Pupils: Pupils are equal, round, and reactive to light.  Cardiovascular:     Rate and Rhythm: Normal rate and regular rhythm.  Pulmonary:     Effort: Pulmonary effort is normal. No respiratory distress.  Abdominal:     General: There is no distension.     Tenderness: There is no abdominal tenderness.  Musculoskeletal:     Comments: No palpable effusion of the right joint space, but the patient is not able to perform any range of motion testing due to discomfort with the right knee, no streaking erythema noted, no posterior leg tenderness, no notable edema of the right lower extremity  in comparison to the left  Skin:    General: Skin is warm and dry.  Neurological:     General: No focal deficit present.     Mental Status: She is alert. Mental status is at baseline.  Psychiatric:        Mood and Affect: Mood normal.        Behavior: Behavior normal.     ED Results / Procedures / Treatments   Labs (all labs ordered are listed, but only abnormal results are displayed) Labs Reviewed  BASIC METABOLIC PANEL - Abnormal; Notable for the following components:      Result Value   Glucose, Bld 151 (*)    All other components within normal limits  SEDIMENTATION RATE - Abnormal; Notable for the following components:   Sed Rate 27 (*)    All other components within normal limits  SARS CORONAVIRUS 2 BY RT PCR  CBC WITH DIFFERENTIAL/PLATELET  C-REACTIVE PROTEIN    EKG None  Radiology VAS [place] LOWER EXTREMITY VENOUS (DVT) (ONLY [facility] & [facility])  Result Date: 01/18/2023  Lower Venous DVT Study Patient Name:  Maureen Swanson  Date of Exam:   01/18/2023 Medical Rec #: [id number]     Accession #:    [id number] Date of Birth: Dec 23, 1965     Patient Gender: F Patient Age:   17 years Exam Location:  [facility] Procedure:      VAS [place] LOWER EXTREMITY VENOUS (DVT) Referring Phys:    --------------------------------------------------------------------------------  Indications: Pain.  Risk Factors: RLE knee replacement 3 weeks ago. Limitations: Poor ultrasound/tissue interface. Comparison Study: No previous RLEV exams. LLEV on 08/23/2021 was negative for                   DVT. Performing Technologist: Ernestene Mention RVT, RDMS  Examination Guidelines: A complete evaluation includes B-mode imaging, spectral Doppler, color Doppler, and power Doppler as needed of all accessible portions of each vessel. Bilateral testing is considered an integral part of a complete examination. Limited examinations for reoccurring indications may be performed as noted. The reflux portion of the exam is performed with the patient in reverse Trendelenburg.  +---------+---------------+---------+-----------+----------+--------------+ RIGHT    CompressibilityPhasicitySpontaneityPropertiesThrombus Aging +---------+---------------+---------+-----------+----------+--------------+ CFV      Full           Yes      Yes                                 +---------+---------------+---------+-----------+----------+--------------+ SFJ      Full                                                        +---------+---------------+---------+-----------+----------+--------------+ FV Prox  Full           Yes      Yes                                 +---------+---------------+---------+-----------+----------+--------------+ FV Mid   Full           Yes      Yes                                 +---------+---------------+---------+-----------+----------+--------------+  FV DistalFull           Yes      Yes                                 +---------+---------------+---------+-----------+----------+--------------+ PFV      Full                                                        +---------+---------------+---------+-----------+----------+--------------+ POP      Full           Yes      Yes                                  +---------+---------------+---------+-----------+----------+--------------+ PTV      Full                                                        +---------+---------------+---------+-----------+----------+--------------+ PERO     Full                                                        +---------+---------------+---------+-----------+----------+--------------+   +----+---------------+---------+-----------+----------+--------------+ LEFTCompressibilityPhasicitySpontaneityPropertiesThrombus Aging +----+---------------+---------+-----------+----------+--------------+ CFV Full           Yes      Yes                                 +----+---------------+---------+-----------+----------+--------------+    Summary: RIGHT: - There is no evidence of deep vein thrombosis in the lower extremity.  - No cystic structure found in the popliteal fossa.  LEFT: - No evidence of common femoral vein obstruction.  *See table(s) above for measurements and observations.    Preliminary    DG Knee Complete 4 Views Right  Result Date: 01/18/2023 CLINICAL DATA:  Postop knee replacement EXAM: RIGHT KNEE - COMPLETE 4+ VIEW COMPARISON:  12/11/2022 FINDINGS: Previous total knee replacement. I have some concern about the tibial component. There could questionably be some developing lucency along the margin that could be seen with motion/loosening. IMPRESSION: Previous total knee replacement. Question developing lucency along the tibial component that could be seen with motion/loosening. Electronically Signed   By: Paulina Fusi M.D.   On: 01/18/2023 10:20    Procedures Procedures    Medications Ordered in ED Medications  morphine (PF) 4 MG/ML injection 4 mg (4 mg Intravenous Given 01/18/23 1227)  ketorolac (TORADOL) 15 MG/ML injection 15 mg (15 mg Intravenous Given 01/18/23 1027)  oxyCODONE-acetaminophen (PERCOCET/ROXICET) 5-325 MG per tablet 1 tablet (1 tablet Oral Given 01/18/23 1354)     ED Course/ Medical Decision Making/ A&P Clinical Course as of 01/18/23 1355  Sun Jan 18, 2023  1127 WBC: 5.5 [MT]    Clinical Course User Index [MT] Terald Sleeper, MD  Medical Decision Making Amount and/or Complexity of Data Reviewed Labs: ordered. Decision-making details documented in ED Course. Radiology: ordered.  Risk Prescription drug management.   Patient is presenting to ED with complaint of joint pain and swelling, now approx 6 weeks post operative from a right knee replacement.    Differential would include venous thrombosis versus postoperative infection versus gout versus joint effusion versus other  Labs vascular ultrasound imaging ordered and personally reviewed and interpreted, showing no emergent findings.  Specifically there is no evidence of DVT.  Only small effusion noted on x-ray.  Patient's labs do not suggest septic arthritis or joint infection, with normal white blood cell count, normal CRP, and only very mildly elevated ESR.  Pain medication and as needed medication ordered for pain.  External records reviewed including discharge summary from the end of June and orthopedic note for right total knee replacement.  On reassessment the patient's pain had improved.  She was able to ambulate easily to the bathroom here and bear weight on her right lower extremity.  When I went back to speak to her, we discussed needing to follow-up in the office with orthopedic clinic, but at this point I do not see an emergent indication for hospitalization.  We will temporarily increase her pain medication at home from 5 mg of oxycodone to 10 mg, and a short-term prescription was sent to her pharmacy.         Final Clinical Impression(s) / ED Diagnoses Final diagnoses:  Post-operative pain    Rx / DC Orders ED Discharge Orders          Ordered    oxyCODONE (ROXICODONE) 5 MG immediate release tablet  Every 6 hours PRN         01/18/23 1316              Terald Sleeper, MD 01/18/23 1355

## 2023-01-30 ENCOUNTER — Encounter: Payer: Self-pay | Admitting: Internal Medicine

## 2023-02-11 ENCOUNTER — Other Ambulatory Visit: Payer: Self-pay | Admitting: Internal Medicine

## 2023-02-19 ENCOUNTER — Ambulatory Visit (INDEPENDENT_AMBULATORY_CARE_PROVIDER_SITE_OTHER): Payer: MEDICAID | Admitting: Podiatry

## 2023-02-19 DIAGNOSIS — M79674 Pain in right toe(s): Secondary | ICD-10-CM | POA: Diagnosis not present

## 2023-02-19 DIAGNOSIS — L84 Corns and callosities: Secondary | ICD-10-CM | POA: Diagnosis not present

## 2023-02-19 DIAGNOSIS — B351 Tinea unguium: Secondary | ICD-10-CM | POA: Diagnosis not present

## 2023-02-19 DIAGNOSIS — M79675 Pain in left toe(s): Secondary | ICD-10-CM

## 2023-02-19 DIAGNOSIS — E1142 Type 2 diabetes mellitus with diabetic polyneuropathy: Secondary | ICD-10-CM | POA: Diagnosis not present

## 2023-02-19 NOTE — Progress Notes (Signed)
  Subjective:  Patient ID: Maureen Swanson, female    DOB: [birth year],  MRN: [id number]  Chief Complaint  Patient presents with   Nail Problem    DFC    57 y.o. female presents with the above complaint. History confirmed with patient. Patient presenting with pain related to dystrophic thickened elongated nails. Patient is unable to trim own nails related to nail dystrophy and/or mobility issues. Patient does have a history of T2DM. Patient does/does not have callus present located at the left plantar forefoot causing pain.   Objective:  Physical Exam: warm, good capillary refill nail exam onychomycosis of the toenails, onycholysis, and dystrophic nails DP pulses palpable, PT pulses palpable, and protective sensation absent Left Foot:  Pain with palpation of nails due to elongation and dystrophic growth.  Right Foot: Pain with palpation of nails due to elongation and dystrophic growth.   Assessment:   1. Pain due to onychomycosis of toenails of both feet   2. Pre-ulcerative calluses   3. Diabetic peripheral neuropathy (HCC)      Plan:  Patient was evaluated and treated and all questions answered.  #Hyperkeratotic lesions/pre ulcerative calluses present sub 1st mpj left foot All symptomatic hyperkeratoses x 1 separate lesions were safely debrided with a sterile #10 blade to patient's level of comfort without incident. We discussed preventative and palliative care of these lesions including supportive and accommodative shoegear, padding, prefabricated and custom molded accommodative orthoses, use of a pumice stone and lotions/creams daily.  #Onychomycosis with pain  -Nails palliatively debrided as below. -Educated on self-care  Procedure: Nail Debridement Rationale: Pain Type of Debridement: manual, sharp debridement. Instrumentation: Nail nipper, rotary burr. Number of Nails: 10  Return in about 3 months (around 05/21/2023) for [facility].         Corinna Gab, DPM [facility] / [facility]

## 2023-03-09 ENCOUNTER — Other Ambulatory Visit: Payer: Self-pay | Admitting: Internal Medicine

## 2023-03-28 ENCOUNTER — Telehealth: Payer: Self-pay | Admitting: Internal Medicine

## 2023-04-01 NOTE — Telephone Encounter (Signed)
Labs have been scheduled. 

## 2023-04-06 ENCOUNTER — Other Ambulatory Visit: Payer: Self-pay | Admitting: Internal Medicine

## 2023-05-01 ENCOUNTER — Encounter (HOSPITAL_COMMUNITY): Payer: Self-pay

## 2023-05-01 ENCOUNTER — Ambulatory Visit (HOSPITAL_COMMUNITY)
Admission: RE | Admit: 2023-05-01 | Discharge: 2023-05-01 | Disposition: A | Discharge status: Home / Self Care | Payer: MEDICAID | Source: Ambulatory Visit | Attending: Emergency Medicine | Admitting: Emergency Medicine

## 2023-05-01 VITALS — BP 129/84 | HR 62 | Temp 98.0°F | Resp 18 | Ht 64.0 in | Wt 223.0 lb

## 2023-05-01 DIAGNOSIS — R1084 Generalized abdominal pain: Secondary | ICD-10-CM | POA: Insufficient documentation

## 2023-05-01 DIAGNOSIS — R112 Nausea with vomiting, unspecified: Secondary | ICD-10-CM | POA: Insufficient documentation

## 2023-05-01 DIAGNOSIS — Z8711 Personal history of peptic ulcer disease: Secondary | ICD-10-CM | POA: Diagnosis present

## 2023-05-01 LAB — COMPREHENSIVE METABOLIC PANEL
ALT: 12 U/L (ref 0–44)
AST: 16 U/L (ref 15–41)
Albumin: 3.6 g/dL (ref 3.5–5.0)
Alkaline Phosphatase: 66 U/L (ref 38–126)
Anion gap: 9 (ref 5–15)
BUN: 14 mg/dL (ref 6–20)
CO2: 29 mmol/L (ref 22–32)
Calcium: 9.7 mg/dL (ref 8.9–10.3)
Chloride: 102 mmol/L (ref 98–111)
Creatinine, Ser: 1.12 mg/dL — ABNORMAL HIGH (ref 0.44–1.00)
GFR, Estimated: 57 mL/min — ABNORMAL LOW (ref >60)
Glucose, Bld: 102 mg/dL — ABNORMAL HIGH (ref 70–99)
Potassium: 3.6 mmol/L (ref 3.5–5.1)
Sodium: 140 mmol/L (ref 135–145)
Total Bilirubin: 0.5 mg/dL (ref <1.2)
Total Protein: 6.8 g/dL (ref 6.5–8.1)

## 2023-05-01 LAB — CBC WITH DIFFERENTIAL/PLATELET
Abs Immature Granulocytes: 0.01 10*3/uL (ref 0.00–0.07)
Basophils Absolute: 0 10*3/uL (ref 0.0–0.1)
Basophils Relative: 0 %
Eosinophils Absolute: 0.1 10*3/uL (ref 0.0–0.5)
Eosinophils Relative: 1 %
HCT: 37 % (ref 36.0–46.0)
Hemoglobin: 11.5 g/dL — ABNORMAL LOW (ref 12.0–15.0)
Immature Granulocytes: 0 %
Lymphocytes Relative: 23 %
Lymphs Abs: 1.6 10*3/uL (ref 0.7–4.0)
MCH: 27.6 pg (ref 26.0–34.0)
MCHC: 31.1 g/dL (ref 30.0–36.0)
MCV: 88.7 fL (ref 80.0–100.0)
Monocytes Absolute: 0.4 10*3/uL (ref 0.1–1.0)
Monocytes Relative: 6 %
Neutro Abs: 5.1 10*3/uL (ref 1.7–7.7)
Neutrophils Relative %: 70 %
Platelets: 240 10*3/uL (ref 150–400)
RBC: 4.17 MIL/uL (ref 3.87–5.11)
RDW: 13.1 % (ref 11.5–15.5)
WBC: 7.2 10*3/uL (ref 4.0–10.5)
nRBC: 0 % (ref 0.0–0.2)

## 2023-05-01 LAB — POCT FASTING CBG KUC MANUAL ENTRY: POCT Glucose (KUC): 103 mg/dL — AB (ref 70–99)

## 2023-05-01 LAB — LIPASE, BLOOD: Lipase: 32 U/L (ref 11–51)

## 2023-05-01 LAB — POCT URINALYSIS DIP (MANUAL ENTRY)
Bilirubin, UA: NEGATIVE
Glucose, UA: 500 mg/dL — AB
Ketones, POC UA: NEGATIVE mg/dL
Leukocytes, UA: NEGATIVE
Nitrite, UA: NEGATIVE
Protein Ur, POC: NEGATIVE mg/dL
Spec Grav, UA: 1.02 (ref 1.010–1.025)
Urobilinogen, UA: 1 U/dL
pH, UA: 5.5 (ref 5.0–8.0)

## 2023-05-01 MED ORDER — ONDANSETRON 4 MG PO TBDP
ORAL_TABLET | ORAL | Status: AC
  Filled 2023-05-01: qty 1

## 2023-05-01 MED ORDER — ONDANSETRON 4 MG PO TBDP
4.0000 mg | ORAL_TABLET | Freq: Once | ORAL | Status: AC
  Administered 2023-05-01: 4 mg via ORAL

## 2023-05-01 MED ORDER — ONDANSETRON 4 MG PO TBDP: 4.0000 mg | ORAL_TABLET | Freq: Four times a day (QID) | ORAL | 0 refills | Status: AC | PRN

## 2023-05-01 MED ORDER — PANTOPRAZOLE SODIUM 40 MG PO TBEC: 40.0000 mg | DELAYED_RELEASE_TABLET | Freq: Every day | ORAL | 0 refills | Status: DC

## 2023-05-01 NOTE — ED Triage Notes (Signed)
Patient c/o abdominal pain, cramping x 1 week.  States that when she eats, it causes vomiting and diarrhea with any kind of food.  Denies any OTC meds.  Also having low back pain.  Denies urinary sx's.  Patient states she did have a UTI last week that she treated with OTC urinary medication from the pharmacy.

## 2023-05-01 NOTE — Discharge Instructions (Addendum)
The zofran (nausea medicine) can be used every 6 hours as needed to settle the stomach  The Protonix (for stomach ulcer/reflux) is taken once daily on an empty stomach. Continue for 14 days in a row.  I will call you if there is anything abnormal on blood work today.  Please go to the emergency department if symptoms worsen or do not improve with these medications.   Call the stomach specialist to make an appointment for follow up

## 2023-05-01 NOTE — ED Provider Notes (Signed)
[facility]    CSN: [id number] Arrival date & time: 05/01/23  1231      History   Chief Complaint Chief Complaint  Patient presents with   Abdominal Pain    HPI Maureen Swanson is a 57 y.o. female.  1 week history of abdominal cramping. Generalized but mostly epigastric. Improves if she's laying flat. Worse with eating.  Eating causes vomiting more often than not. She can tolerate fluids. Had a piece of bologna this morning that was thrown up. 3 days ago was the last time she ate and kept it down. No urinary symptoms  History of GERD, gastric ulcer, T2 DM Takes metformin and jardiance with compliance  Hx of substance and alcohol abuse   Past Medical History:  Diagnosis Date   Anxiety    Arthritis    back and knees   Asthma    daily and prn inhalers   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 x 12 yrs.   Sleep apnea    Stroke (HCC)    1995   Mild. no residual problems   Substance abuse (HCC)    crack cocaine:  extensive treatment with [facility] [place] from 2014 to 2018 for this and alcohol abuse.   TMJ (temporomandibular joint disorder)     Patient Active Problem List   Diagnosis Date Noted   S/P total knee arthroplasty, right 12/11/2022   Type 2 diabetes mellitus with complication, with long-term current use of insulin (HCC) 11/23/2022   Hemorrhoids 04/02/2022   Aphthous ulcer of mouth 04/02/2022   Osteoarthritis of left knee 02/19/2022   OSA (obstructive sleep apnea) 12/31/2021   Acute gout of left foot 12/06/2019   CHF (congestive heart failure) (HCC) 08/03/2016   Cannabis use disorder, moderate, dependence (HCC)  11/20/2015   Homicidal ideation    Tobacco use disorder 07/20/2015   Diabetes mellitus (HCC) 07/20/2015   Cocaine use disorder, moderate, dependence (HCC) 04/03/2015   Schizoaffective disorder, bipolar type (HCC) 04/03/2015   Alcohol use disorder, moderate, dependence (HCC) 04/03/2015   Acute ischemic stroke (HCC) 12/08/2014   Gout attack 11/12/2014   Osteoarthritis of right knee 11/08/2014   Diabetic peripheral neuropathy (HCC) 11/04/2014   Mixed hyperlipidemia 11/04/2014   Small vessel disease, cerebrovascular 11/04/2014   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   Cocaine abuse (HCC) 02/19/2012   Knee pain 10/17/2010   Abnormal mammogram 08/05/2010   AMENORRHEA, SECONDARY 10/29/2009   HERPES ZOSTER 08/20/2009   Trichomonas vaginitis 01/26/2009   GERD 10/11/2007   Essential hypertension 02/26/2007    Past Surgical History:  Procedure Laterality Date   BREAST EXCISIONAL BIOPSY Right    BREAST LUMPECTOMY WITH NEEDLE LOCALIZATION  04/19/2012   Procedure: BREAST LUMPECTOMY WITH NEEDLE LOCALIZATION;  Surgeon: Robyne Askew, MD;  Location: [facility];  Service: General;  Laterality: Right;   FOOT SURGERY Left 2022   [facility]   KNEE ARTHROPLASTY Left 02/19/2022   Procedure: COMPUTER ASSISTED TOTAL KNEE ARTHROPLASTY;  Surgeon: Samson Frederic, MD;  Location: [facility];  Service: Orthopedics;  Laterality: Left;   KNEE ARTHROPLASTY Right 12/11/2022   Procedure: RIGHT COMPUTER ASSISTED TOTAL KNEE ARTHROPLASTY;  Surgeon: Samson Frederic, MD;  Location: [facility];  Service: Orthopedics;  Laterality: Right;   KNEE ARTHROSCOPY W/ PARTIAL MEDIAL MENISCECTOMY  05/01/2010   right    OB History     Gravida  3   Para  2   Term  2   Preterm      AB  1   Living  2      SAB  1   IAB      Ectopic      Multiple      Live Births               Home Medications    Prior to  Admission medications   Medication Sig Start Date End Date Taking? Authorizing Provider  ABILIFY MAINTENA 400 MG PRSY prefilled syringe Inject 400 mg into the muscle every 30 (thirty) days. Last injection given on 01/10/2021. Next injection due 02/07/2021 01/09/21  Yes Laveda Abbe, NP  Accu-Chek Softclix Lancets lancets Use as instructed 06/06/22  Yes Sherral Hammers, Cooper A, PA  ADVAIR DISKUS 100-50 MCG/ACT AEPB INHALE 1 PUFF BY MOUTH EVERY 12 HOURS AND RINSE MOUTH AFTER USE 12/19/22  Yes Julieanne Manson, MD  amLODipine (NORVASC) 10 MG tablet Take 1 tablet (10 mg total) by mouth daily. 1 tab by mouth daily 10/13/22  Yes Julieanne Manson, MD  atenolol (TENORMIN) 100 MG tablet TAKE ONE TABLET BY MOUTH DAILY 04/06/23  Yes Julieanne Manson, MD  atorvastatin (LIPITOR) 40 MG tablet Take 1 tablet (40 mg total) by mouth at bedtime. 10/13/22  Yes Julieanne Manson, MD  cetirizine (ZYRTEC) 10 MG tablet TAKE ONE TABLET BY MOUTH DAILY FOR ALLERGIES AS NEEDED 04/06/23  Yes Julieanne Manson, MD  cyclobenzaprine (FLEXERIL) 5 MG tablet TAKE ONE TABLET BY MOUTH AT BEDTIME AS NEEDED 12/19/22  Yes Julieanne Manson, MD  diclofenac Sodium (VOLTAREN) 1 % GEL APPLY TWO GRAMS TO THE KNEES UP TO FOUR TIMES A DAY AS NEEDED FOR PAIN 03/09/23  Yes Julieanne Manson, MD  empagliflozin (JARDIANCE) 10 MG TABS tablet Take 1 tablet (10 mg total) by mouth daily before breakfast. 10/13/22  Yes Julieanne Manson, MD  hydrochlorothiazide (HYDRODIURIL) 25 MG tablet TAKE ONE TABLET BY MOUTH DAILY IN THE MORNING WITH BREAKFAST 08/10/22  Yes Julieanne Manson, MD  lamoTRIgine (LAMICTAL) 100 MG tablet Take 100 mg by mouth daily. 05/14/21  Yes [provider]  losartan (COZAAR) 100 MG tablet TAKE ONE TABLET BY MOUTH DAILY IN THE MORNING 08/10/22  Yes Julieanne Manson, MD  melatonin 3 MG TABS tablet Take 1 tablet (3 mg total) by mouth at bedtime. 01/09/21  Yes Laveda Abbe, NP  metFORMIN (GLUCOPHAGE) 1000 MG  tablet TAKE ONE TABLET BY MOUTH TWICE A DAY WITH MEAL 04/06/23  Yes Julieanne Manson, MD  ondansetron (ZOFRAN-ODT) 4 MG disintegrating tablet Take 1 tablet (4 mg total) by mouth every 6 (six) hours as needed for nausea or vomiting. 05/01/23  Yes Ramiah Helfrich, Lurena Joiner, PA-C  pantoprazole (PROTONIX) 40 MG tablet Take 1 tablet (40 mg total) by mouth daily for 14 days. 05/01/23 05/15/23 Yes Clancy Mullarkey, Lurena Joiner, PA-C  prazosin (MINIPRESS) 1 MG capsule Take 1 mg by mouth at bedtime.   Yes [provider]  trazodone (DESYREL) 300 MG tablet Take 300 mg by mouth at bedtime.   Yes [provider]  valbenazine Allena Earing)  80 MG capsule Take 80 mg by mouth at bedtime.   Yes [provider]  VENTOLIN HFA 108 (90 Base) MCG/ACT inhaler INHALE TWO PUFFS BY MOUTH EVERY SIX HOURS AS NEEDED FOR SHORTNESS OF BREATH 04/06/23  Yes Julieanne Manson, MD    Family History Family History  Problem Relation Age of Onset   Diabetes Mother    Breast cancer Mother        She is not sure about this diagnosis   Cancer Father        Laryngeal   Multiple sclerosis Sister        not clear if this is her diagnosis   Bipolar disorder Maternal Aunt    Alcoholism Maternal Uncle    Schizophrenia Maternal Grandfather     Social History Social History   Tobacco Use   Smoking status: Former    Current packs/day: 0.00    Average packs/day: 1 pack/day for 37.0 years (37.0 ttl pk-yrs)    Types: Cigarettes    Start date: 12/15/1983    Quit date: 12/14/2020    Years since quitting: 2.3    Passive exposure: Past   Smokeless tobacco: Never  Vaping Use   Vaping status: Never Used  Substance Use Topics   Alcohol use: Not Currently    Comment: History of abuse.  Clean for 9 months dated 12/31/2021   Drug use: Not Currently    Types: Cocaine, "Crack" cocaine    Comment: Clean for 9 months dated 12/31/2021     Allergies   Chocolate, Penicillins, Orange, Strawberry extract, and Tomato   Review of  Systems Review of Systems  Gastrointestinal:  Positive for abdominal pain.   Per HPI  Physical Exam Triage Vital Signs ED Triage Vitals  Encounter Vitals Group     BP 05/01/23 1315 129/84     Systolic BP Percentile --      Diastolic BP Percentile --      Pulse Rate 05/01/23 1315 (!) 59     Resp 05/01/23 1315 18     Temp 05/01/23 1315 98 F (36.7 C)     Temp Source 05/01/23 1315 Oral     SpO2 05/01/23 1315 92 %     Weight 05/01/23 1317 223 lb (101.2 kg)     Height 05/01/23 1317 5\' 4"  (1.626 m)     Head Circumference --      Peak Flow --      Pain Score 05/01/23 1316 8     Pain Loc --      Pain Education --      Exclude from Growth Chart --    No data found.  Updated Vital Signs BP 129/84 (BP Location: Right Arm)   Pulse 62   Temp 98 F (36.7 C) (Oral)   Resp 18   Ht 5\' 4"  (1.626 m)   Wt 223 lb (101.2 kg)   SpO2 98%   BMI 38.28 kg/m   Physical Exam Vitals and nursing note reviewed.  Constitutional:      General: She is not in acute distress.    Appearance: Normal appearance. She is not ill-appearing or diaphoretic.  HENT:     Mouth/Throat:     Mouth: Mucous membranes are moist.     Pharynx: Oropharynx is clear. No posterior oropharyngeal erythema.  Eyes:     General: No scleral icterus.    Conjunctiva/sclera: Conjunctivae normal.  Cardiovascular:     Rate and Rhythm: Normal rate and regular rhythm.  Pulses: Normal pulses.     Heart sounds: Normal heart sounds.  Pulmonary:     Effort: Pulmonary effort is normal.     Breath sounds: Normal breath sounds.  Abdominal:     General: Bowel sounds are normal.     Palpations: Abdomen is soft.     Tenderness: There is abdominal tenderness in the right upper quadrant and epigastric area. There is no right CVA tenderness, left CVA tenderness, guarding or rebound.  Musculoskeletal:        General: Normal range of motion.  Skin:    General: Skin is warm and dry.  Neurological:     Mental Status: She is alert  and oriented to person, place, and time.     UC Treatments / Results  Labs (all labs ordered are listed, but only abnormal results are displayed) Labs Reviewed  COMPREHENSIVE METABOLIC PANEL - Abnormal; Notable for the following components:      Result Value   Glucose, Bld 102 (*)    Creatinine, Ser 1.12 (*)    GFR, Estimated 57 (*)    All other components within normal limits  CBC WITH DIFFERENTIAL/PLATELET - Abnormal; Notable for the following components:   Hemoglobin 11.5 (*)    All other components within normal limits  POCT URINALYSIS DIP (MANUAL ENTRY) - Abnormal; Notable for the following components:   Glucose, UA =500 (*)    Blood, UA trace-intact (*)    All other components within normal limits  POCT FASTING CBG [facility] MANUAL ENTRY - Abnormal; Notable for the following components:   POCT Glucose ([facility]) 103 (*)    All other components within normal limits  LIPASE, BLOOD    EKG  Radiology No results found.  Procedures Procedures (including critical care time)  Medications Ordered in UC Medications  ondansetron (ZOFRAN-ODT) disintegrating tablet 4 mg (4 mg Oral Given 05/01/23 1404)    Initial Impression / Assessment and Plan / UC Course  I have reviewed the triage vital signs and the nursing notes.  Pertinent labs & imaging results that were available during my care of the patient were reviewed by me and considered in my medical decision making (see chart for details).  CBG 103 UA trace RBC, 500 glucose. No ketones.   CBC with hgb 11.5, 4 months ago was 11. Otherwise WNL CMP creatinine 1.12, likely from dehydration. Will push fluids Lipase 32  Tender epigastric and RUQ without guarding/rebound. She is afebrile and in no acute distress.  Discussed possible etiologies. Consider stomach ulcer, reflux, gallbladder. Zofran given in clinic, recommend use q6 hours. Start Protonix once daily  At this time no indication for emergent eval in the ED, however we dicussed  strict precautions for any worsening of symptoms or if no improvement with the prescribed medications. If she does well with meds I have recommended she call GI for follow up appointment. Patient is agreeable to plan, no questions at this time   Final Clinical Impressions(s) / UC Diagnoses   Final diagnoses:  Generalized abdominal pain  Nausea and vomiting, unspecified vomiting type  History of gastric ulcer     Discharge Instructions      The zofran (nausea medicine) can be used every 6 hours as needed to settle the stomach  The Protonix (for stomach ulcer/reflux) is taken once daily on an empty stomach. Continue for 14 days in a row.  I will call you if there is anything abnormal on blood work today.  Please go to the emergency department  if symptoms worsen or do not improve with these medications.   Call the stomach specialist to make an appointment for follow up     ED Prescriptions     Medication Sig Dispense Auth. Provider   ondansetron (ZOFRAN-ODT) 4 MG disintegrating tablet Take 1 tablet (4 mg total) by mouth every 6 (six) hours as needed for nausea or vomiting. 20 tablet Chaney Ingram, PA-C   pantoprazole (PROTONIX) 40 MG tablet Take 1 tablet (40 mg total) by mouth daily for 14 days. 14 tablet Jonte Shiller, Lurena Joiner, PA-C      PDMP not reviewed this encounter.   Marlow Baars, [place] 05/01/23 1610

## 2023-05-04 ENCOUNTER — Other Ambulatory Visit: Payer: Self-pay | Admitting: Internal Medicine

## 2023-05-19 ENCOUNTER — Other Ambulatory Visit: Payer: MEDICAID

## 2023-05-21 ENCOUNTER — Ambulatory Visit: Payer: MEDICAID | Admitting: Podiatry

## 2023-05-25 ENCOUNTER — Ambulatory Visit: Payer: MEDICAID | Admitting: Internal Medicine

## 2023-05-28 ENCOUNTER — Ambulatory Visit: Payer: MEDICAID | Admitting: Podiatry

## 2023-06-12 ENCOUNTER — Ambulatory Visit: Payer: MEDICAID | Admitting: Podiatry

## 2023-06-19 ENCOUNTER — Ambulatory Visit: Payer: MEDICAID | Admitting: Neurology

## 2023-06-24 NOTE — Progress Notes (Deleted)
 Assessment/Plan:   ***  Subjective:   Maureen Swanson was seen today in the movement disorders clinic for neurologic consultation at the request of Fulp, Cammie, MD.  The consultation is for the evaluation of rest and intention tremor.  Medical records made available to me are reviewed.  Patient is on Ingrezza .  Tremor: {yes no:314532}   How long has it been going on? ***  At rest or with activation?  ***  When is it noted the most?  ***  Fam hx of tremor?  {yes wn:685467}  Located where?  ***  Affected by caffeine:  {yes no:314532}  Affected by alcohol :  {yes no:314532}  Affected by stress:  {yes no:314532}  Affected by fatigue:  {yes no:314532}  Spills soup if on spoon:  {yes no:314532}  Spills glass of liquid if full:  {yes no:314532}  Affects ADL's (tying shoes, brushing teeth, etc):  {yes no:314532}  Tremor inducing meds:  Yes.   ; on ingrezza  currently.  Has been on Abilify  Other Specific Symptoms:  Voice: *** Sleep: ***  Vivid Dreams:  {yes no:314532}  Acting out dreams:  {yes no:314532} Wet Pillows: {yes no:314532} Postural symptoms:  {yes no:314532}  Falls?  {yes no:314532} Bradykinesia symptoms: {parkinson brady:18041} Loss of smell:  {yes no:314532} Loss of taste:  {yes no:314532} Urinary Incontinence:  {yes no:314532} Difficulty Swallowing:  {yes no:314532} Handwriting, micrographia: {yes no:314532} Trouble with ADL's:  {yes no:314532}  Trouble buttoning clothing: {yes no:314532} Depression:  {yes no:314532} Memory changes:  {yes no:314532} Hallucinations:  {yes no:314532}  visual distortions: {yes no:314532} N/V:  {yes no:314532} Lightheaded:  {yes no:314532}  Syncope: {yes no:314532} Diplopia:  {yes no:314532} Dyskinesia:  {yes no:314532}  Last neuroimaging of the brain was January, 2023 and this was a CT brain that was unremarkable.  PREVIOUS MEDICATIONS: {Parkinson's RX:18200}  ALLERGIES:   Allergies  Allergen Reactions   Chocolate Hives    Penicillins Hives and Other (See Comments)        Orange Hives   Strawberry Extract Swelling   Tomato Hives and Other (See Comments)    acid foods    CURRENT MEDICATIONS:  Current Outpatient Medications  Medication Instructions   Abilify  Maintena 400 mg, Intramuscular, Every 30 days, Last injection given on 01/10/2021. Next injection due 02/07/2021   Accu-Chek Softclix Lancets lancets Use as instructed   ADVAIR  DISKUS 100-50 MCG/ACT AEPB INHALE 1 PUFF BY MOUTH EVERY 12 HOURS AND RINSE MOUTH AFTER USE   amLODipine  (NORVASC ) 10 mg, Oral, Daily, 1 tab by mouth daily   atenolol  (TENORMIN ) 100 mg, Oral, Daily   atorvastatin  (LIPITOR) 40 mg, Oral, Daily at bedtime   cetirizine  (ZYRTEC ) 10 MG tablet TAKE ONE TABLET BY MOUTH DAILY FOR ALLERGIES AS NEEDED   cyclobenzaprine  (FLEXERIL ) 5 mg, Oral, At bedtime PRN   diclofenac  Sodium (VOLTAREN ) 1 % GEL APPLY TWO GRAMS TO THE KNEES UP TO FOUR TIMES A DAY AS NEEDED FOR PAIN   empagliflozin  (JARDIANCE ) 10 mg, Oral, Daily before breakfast   hydrochlorothiazide  (HYDRODIURIL ) 25 MG tablet TAKE ONE TABLET BY MOUTH DAILY IN THE MORNING WITH BREAKFAST   lamoTRIgine  (LAMICTAL ) 100 mg, Oral, Daily   losartan  (COZAAR ) 100 MG tablet TAKE ONE TABLET BY MOUTH DAILY IN THE MORNING   melatonin 3 mg, Oral, Daily at bedtime   metFORMIN  (GLUCOPHAGE ) 1000 MG tablet TAKE ONE TABLET BY MOUTH TWICE A DAY WITH MEAL   ondansetron  (ZOFRAN -ODT) 4 mg, Oral, Every 6 hours PRN   pantoprazole  (PROTONIX ) 40 mg, Oral, Daily  prazosin  (MINIPRESS ) 1 mg, Oral, Daily at bedtime   trazodone  (DESYREL ) 300 mg, Oral, Daily at bedtime   valbenazine  (INGREZZA ) 80 mg, Oral, Daily at bedtime   VENTOLIN  HFA 108 (90 Base) MCG/ACT inhaler INHALE TWO PUFFS BY MOUTH EVERY SIX HOURS AS NEEDED FOR SHORTNESS OF BREATH    Objective:   PHYSICAL EXAMINATION:    VITALS:  There were no vitals filed for this visit.  GEN:  The patient appears stated age and is in NAD. HEENT:  Normocephalic,  atraumatic.  The mucous membranes are moist. The superficial temporal arteries are without ropiness or tenderness. CV:  RRR Lungs:  CTAB Neck/HEME:  There are no carotid bruits bilaterally.  Neurological examination:  Orientation: The patient is alert and oriented x3.  Cranial nerves: There is good facial symmetry.  Extraocular muscles are intact. The visual fields are full to confrontational testing. The speech is fluent and clear. Soft palate rises symmetrically and there is no tongue deviation. Hearing is intact to conversational tone. Sensation: Sensation is intact to light touch throughout (facial, trunk, extremities). Vibration is intact at the bilateral big toe. There is no extinction with double simultaneous stimulation.  Motor: Strength is 5/5 in the bilateral upper and lower extremities.   Shoulder shrug is equal and symmetric.  There is no pronator drift. Deep tendon reflexes: Deep tendon reflexes are 2/4 at the bilateral biceps, triceps, brachioradialis, patella and achilles. Plantar responses are downgoing bilaterally.  Movement examination: Tone: There is ***tone in the bilateral upper extremities.  The tone in the lower extremities is ***.  Abnormal movements: *** Coordination:  There is *** decremation with RAM's, *** Gait and Station: The patient has *** difficulty arising out of a deep-seated chair without the use of the hands. The patient's stride length is ***.  The patient has a *** pull test.     I have reviewed and interpreted the following labs independently   Chemistry      Component Value Date/Time   NA 140 05/01/2023 1358   NA 140 01/13/2023 1120   K 3.6 05/01/2023 1358   CL 102 05/01/2023 1358   CO2 29 05/01/2023 1358   BUN 14 05/01/2023 1358   BUN 10 01/13/2023 1120   CREATININE 1.12 (H) 05/01/2023 1358      Component Value Date/Time   CALCIUM  9.7 05/01/2023 1358   ALKPHOS 66 05/01/2023 1358   AST 16 05/01/2023 1358   ALT 12 05/01/2023 1358   BILITOT  0.5 05/01/2023 1358   BILITOT 0.2 01/13/2023 1120      Lab Results  Component Value Date   TSH 0.909 12/31/2021   Lab Results  Component Value Date   WBC 7.2 05/01/2023   HGB 11.5 (L) 05/01/2023   HCT 37.0 05/01/2023   MCV 88.7 05/01/2023   PLT 240 05/01/2023      Total time spent on today's visit was ***greater than 60 minutes, including both face-to-face time and nonface-to-face time.  Time included that spent on review of records (prior notes available to me/labs/imaging if pertinent), discussing treatment and goals, answering patient's questions and coordinating care.  Cc:  No primary care provider on file.

## 2023-06-26 ENCOUNTER — Ambulatory Visit: Payer: MEDICAID | Admitting: Neurology

## 2023-06-29 ENCOUNTER — Other Ambulatory Visit: Payer: Self-pay | Admitting: Internal Medicine

## 2023-06-30 NOTE — Progress Notes (Signed)
Assessment/Plan:   Parkinsonism  -I had a long counseling session with the patient today.  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  I explained that one clinically cannot tell the difference between idiopathic parkinsons disease and secondary parkinsonism from medication.  I also explained that even if one is able to get off of the medication, it can take up to 6 months to clinically definitively know if this is idiopathic parkinsons disease.  I did not advise that the patient go off of medication, as this needs to be discussed with the patients prescribing physician.  I did, however, tell the patient that the longer one is on the medication, the worse the symptoms can get.  The patient is to make an appointment with Dr. Cherylann Ratel to discuss what I have discussed with her.    Tardive dyskinesia, by history  -I did not see any evidence of tardive dyskinesia today.  It does not mean that she has not had this in the past, as she is currently on Ingrezza and it could be covering up the symptoms.  However, patient states that she was placed on the Ingrezza to help with the tremor and the symptoms that she presented with today.  Discussed with patient that secondary parkinsonism is not quite the same as tardive dyskinesia.  We discussed also that Ingrezza can cause parkinsonism, although it is highly likely that all of her parkinsonism is from Abilify.  Subjective:   Maureen Swanson was seen today in the movement disorders clinic for neurologic consultation at the request of Fulp, Cammie, MD.  The consultation is for the evaluation of rest and intention tremor.  Medical records made available to me are reviewed.  Patient is on Ingrezza.  She reports that she was told her tremor was from TD and placed on ingrezza 2 years ago.  It hasn't helped - not better or worse.  She sees Dr. Cherylann Ratel  Tremor: Yes.     How long has it been going on? 1 year  At rest or with  activation?  both  Fam hx of tremor?  Her maternal aunt has tremor with ? dx  Located where?  L arm and R leg; she is L handed  Affected by caffeine:  No.  Affected by alcohol:  doesn't drink alcohol - sober x 2 years  Affected by stress:  Yes.    Tremor inducing meds:  Yes.   ; on ingrezza currently.  Has been on Abilify x 3-4 years  Other Specific Symptoms:  Voice: no change Wet Pillows: Yes.   Postural symptoms:  Yes.    Falls?  No. Bradykinesia symptoms: slow movements and difficulty getting out of a chair; some dragging of the feet   Last neuroimaging of the brain was January, 2023 and this was a CT brain that was unremarkable.   ALLERGIES:   Allergies  Allergen Reactions   Chocolate Hives   Penicillins Hives and Other (See Comments)        Orange Hives   Strawberry Extract Swelling   Tomato Hives and Other (See Comments)    "acid foods"    CURRENT MEDICATIONS:  Current Outpatient Medications  Medication Instructions   Abilify Maintena 400 mg, Intramuscular, Every 30 days, Last injection given on 01/10/2021. Next injection due 02/07/2021   Accu-Chek Softclix Lancets lancets Use as instructed   ADVAIR DISKUS 100-50 MCG/ACT AEPB INHALE 1 PUFF BY MOUTH EVERY 12 HOURS AND RINSE MOUTH  AFTER USE   amLODipine (NORVASC) 10 mg, Oral, Daily, 1 tab by mouth daily   atenolol (TENORMIN) 100 mg, Oral, Daily   atorvastatin (LIPITOR) 40 mg, Oral, Daily at bedtime   cetirizine (ZYRTEC) 10 MG tablet TAKE ONE TABLET BY MOUTH DAILY FOR ALLERGIES AS NEEDED   cyclobenzaprine (FLEXERIL) 5 mg, Oral, At bedtime PRN   diclofenac Sodium (VOLTAREN) 1 % GEL APPLY TWO GRAMS TO THE KNEES UP TO FOUR TIMES A DAY AS NEEDED FOR PAIN   empagliflozin (JARDIANCE) 10 mg, Oral, Daily before breakfast   hydrochlorothiazide (HYDRODIURIL) 25 MG tablet TAKE ONE TABLET BY MOUTH DAILY IN THE MORNING WITH BREAKFAST   lamoTRIgine (LAMICTAL) 100 mg, Daily   losartan (COZAAR) 100 MG tablet TAKE ONE TABLET BY MOUTH  DAILY IN THE MORNING   melatonin 3 mg, Oral, Daily at bedtime   metFORMIN (GLUCOPHAGE) 1000 MG tablet TAKE ONE TABLET BY MOUTH TWICE A DAY WITH MEAL   ondansetron (ZOFRAN-ODT) 4 mg, Oral, Every 6 hours PRN   pantoprazole (PROTONIX) 40 mg, Oral, Daily   prazosin (MINIPRESS) 1 mg, Daily at bedtime   trazodone (DESYREL) 300 mg, Daily at bedtime   valbenazine (INGREZZA) 80 mg, Daily at bedtime   VENTOLIN HFA 108 (90 Base) MCG/ACT inhaler INHALE TWO PUFFS BY MOUTH EVERY SIX HOURS AS NEEDED FOR SHORTNESS OF BREATH    Objective:   PHYSICAL EXAMINATION:    VITALS:   Vitals:   07/02/23 1344  BP: 126/82  Pulse: 91  SpO2: 96%  Weight: 229 lb 3.2 oz (104 kg)  Height: 5\' 4"  (1.626 m)    GEN:  The patient appears stated age and is in NAD. HEENT:  Normocephalic, atraumatic.  The mucous membranes are moist. The superficial temporal arteries are without ropiness or tenderness. CV:  RRR Lungs:  CTAB Neck/HEME:  There are no carotid bruits bilaterally.  Neurological examination:  Orientation: The patient is alert and oriented x3.  Cranial nerves: There is good facial symmetry.  Extraocular muscles are intact. The visual fields are full to confrontational testing. The speech is fluent and clear. Soft palate rises symmetrically and there is no tongue deviation. Hearing is intact to conversational tone. Sensation: Sensation is intact to light touch throughout (facial, trunk, extremities). Vibration is intact at the bilateral big toe. There is no extinction with double simultaneous stimulation.  Motor: Strength is 5/5 in the bilateral upper and lower extremities.   Shoulder shrug is equal and symmetric.  There is no pronator drift. Deep tendon reflexes: Deep tendon reflexes are 2/4 at the bilateral biceps, triceps, brachioradialis, patella and achilles. Plantar responses are downgoing bilaterally.  Movement examination: Tone: There is nl tone in the bilateral upper extremities.  The tone in the  lower extremities is nl.  Abnormal movements: there is mild L>RUE rest tremor Coordination:  There is decremation with RAM's, only with LUE finger taps.  All other RAMs are normal, with any form of RAMS, including alternating supination and pronation of the forearm, hand opening and closing,  heel taps and toe taps bilaterally.  Gait and Station: The patient pushes off to arise. The patient is wide based and slightly unbalanced in the turn but she catches herself.  No shuffling.  Slight decreased arm swing on the L.   I have reviewed and interpreted the following labs independently   Chemistry      Component Value Date/Time   NA 140 05/01/2023 1358   NA 140 01/13/2023 1120   K 3.6 05/01/2023 1358  CL 102 05/01/2023 1358   CO2 29 05/01/2023 1358   BUN 14 05/01/2023 1358   BUN 10 01/13/2023 1120   CREATININE 1.12 (H) 05/01/2023 1358      Component Value Date/Time   CALCIUM 9.7 05/01/2023 1358   ALKPHOS 66 05/01/2023 1358   AST 16 05/01/2023 1358   ALT 12 05/01/2023 1358   BILITOT 0.5 05/01/2023 1358   BILITOT 0.2 01/13/2023 1120      Lab Results  Component Value Date   TSH 0.909 12/31/2021   Lab Results  Component Value Date   WBC 7.2 05/01/2023   HGB 11.5 (L) 05/01/2023   HCT 37.0 05/01/2023   MCV 88.7 05/01/2023   PLT 240 05/01/2023      Total time spent on today's visit was 65 minutes, including both face-to-face time and nonface-to-face time.  Time included that spent on review of records (prior notes available to me/labs/imaging if pertinent), discussing treatment and goals, answering patient's questions and coordinating care.  Cc:  Cain Saupe, MD

## 2023-07-02 ENCOUNTER — Encounter: Payer: Self-pay | Admitting: Neurology

## 2023-07-02 ENCOUNTER — Ambulatory Visit (INDEPENDENT_AMBULATORY_CARE_PROVIDER_SITE_OTHER): Payer: MEDICAID | Admitting: Neurology

## 2023-07-02 VITALS — BP 126/82 | HR 91 | Ht 64.0 in | Wt 229.2 lb

## 2023-07-02 DIAGNOSIS — G2111 Neuroleptic induced parkinsonism: Secondary | ICD-10-CM

## 2023-07-02 NOTE — Patient Instructions (Signed)
We have discussed that your parkinsonian symptoms are likely due to your abilify medication.  You need to contact your prescribing physician or provider to discuss what we discussed today.  Do not just stop the medication on your own.  If you are able to get off of the medication,  it can take up to 6 months to resolve some of the symptoms that you are experiencing.  If you are able to get off of the medication AND your symptoms are not resolved in 6 months, then I want to see you back in the office for a follow up appointment.  I did not see tardive dyskinesia in you today, but perhaps you did have it before the Ingrezza was started.  You don't have any evidence of that right now.  Rarely, ingrezza can worsen parkinsonism symptoms.  The physicians and staff at [facility] are committed to providing excellent care. You may receive a survey requesting feedback about your experience at our office. We strive to receive "very good" responses to the survey questions. If you feel that your experience would prevent you from giving the office a "very good " response, please contact our office to try to remedy the situation. We may be reached at [phone]. Thank you for taking the time out of your busy day to complete the survey.

## 2023-07-23 ENCOUNTER — Ambulatory Visit: Payer: MEDICAID | Admitting: Podiatry

## 2023-07-24 ENCOUNTER — Other Ambulatory Visit: Payer: Self-pay

## 2023-07-24 ENCOUNTER — Encounter: Payer: Self-pay | Admitting: Internal Medicine

## 2023-07-24 ENCOUNTER — Ambulatory Visit (INDEPENDENT_AMBULATORY_CARE_PROVIDER_SITE_OTHER): Payer: MEDICAID | Admitting: Internal Medicine

## 2023-07-24 VITALS — BP 128/84 | HR 60 | Temp 98.0°F | Resp 16 | Ht 64.0 in | Wt 227.8 lb

## 2023-07-24 DIAGNOSIS — J4540 Moderate persistent asthma, uncomplicated: Secondary | ICD-10-CM

## 2023-07-24 DIAGNOSIS — J3089 Other allergic rhinitis: Secondary | ICD-10-CM | POA: Diagnosis not present

## 2023-07-24 DIAGNOSIS — L500 Allergic urticaria: Secondary | ICD-10-CM

## 2023-07-24 MED ORDER — BREZTRI AEROSPHERE 160-9-4.8 MCG/ACT IN AERO: 2.0000 | INHALATION_SPRAY | Freq: Two times a day (BID) | RESPIRATORY_TRACT | 5 refills | Status: DC

## 2023-07-24 MED ORDER — ALBUTEROL SULFATE HFA 108 (90 BASE) MCG/ACT IN AERS: 1.0000 | INHALATION_SPRAY | Freq: Four times a day (QID) | RESPIRATORY_TRACT | 1 refills | Status: AC | PRN

## 2023-07-24 NOTE — Progress Notes (Signed)
 NEW PATIENT  Date of Service/Encounter:  07/24/23  Consult requested by: Alec House, MD   Subjective:   Maureen Swanson (DOB: 16-Mar-1966) is a 58 y.o. female who presents to the clinic on 07/24/2023 with a chief complaint of Asthma, Seasonal and Perennial Allergic Rhinitis, Eczema, and Gastroesophageal Reflux .    History obtained from: chart review and patient.   SOB: Reports hx of asthma since age 62.   The past 2 years, has had on and off shortness of breath.  Can't walk much and bending over causes her to be SOB.  Also gets SOB just at rest sometimes.  Has to use 2 pillows to lay down, can't lay flat.  She is going to see cardiology soon. No tobacco hx; does report prior cocaine use about 2 years ago.  On Advair  100-50 1 puff BID and PRN Albuterol ; neither help with the SOB.   She does not exercise much. 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  Has OSA but awaiting further workup before getting CPAP machine.  Previous eval in 2023 with Cardiology with diastolic dysfunction but normal LVEF and valvular function.  Limitations to daily activity: some 0 ED visits/UC visits and 0 oral steroids in the past year 0 number of lifetime hospitalizations, 0 number of lifetime intubations.  Identified Triggers: not sure  Prior PFTs or spirometry: none  Previously used therapies: not sure  Current regimen:  Maintenance: Advair  Diskus 100-50 1 puff BID  Rescue: Albuterol  2 puffs q4-6 hrs PRN  Rhinitis:  Started 4-5 years ago.  Symptoms include: nasal congestion, rhinorrhea, and post nasal drainage  Occurs seasonally-Summer/Winter  Potential triggers: not sure  Treatments tried:  Zyrtec   No nose sprays  Previous allergy  testing: no History of sinus surgery: no Nonallergic triggers: none    Concern for Food Allergy :  Foods of concern: strawberry/oranges   History of reaction: hives with ingestion, no other symptoms  Previous allergy  testing no Carries an epinephrine   autoinjector: no     Reviewed:  07/16/2023: seen by [facility] for SOB, occurs at rest also.  Has to prop herself upright, can't lay flat. Referred to Cardiology and Allergy .   Echo 01/01/2022: LVEF 60-65%, moderate LVH, grade 1 diastolic dysfunction.  01/15/2022: seen by Dr Burnard Cardiology for preop cardiac clearance. Has long standing hx of HTN, DM, HLD.  Also with hx of asthma on both controller and PRN albuterol .  Discussed need for CPAP for OSA.   11/28/2022: seen by preop; as hx of OSA, does not use CPAP.    Past Medical History: Past Medical History:  Diagnosis Date   Anxiety    Arthritis    back and knees   Asthma    daily and prn inhalers   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 x 12 yrs.   Sleep apnea    Stroke (HCC)    1995   Mild. no residual problems   Substance abuse (HCC)    crack cocaine:  extensive treatment with [facility] [place] from 2014 to 2018  for this and alcohol  abuse.   TMJ (temporomandibular joint disorder)    Past Surgical History: Past Surgical History:  Procedure Laterality Date   BREAST EXCISIONAL BIOPSY Right    BREAST LUMPECTOMY WITH NEEDLE LOCALIZATION  04/19/2012   Procedure: BREAST LUMPECTOMY WITH NEEDLE LOCALIZATION;  Surgeon: Deward GORMAN Curvin DOUGLAS, MD;  Location: [facility];  Service: General;  Laterality: Right;   FOOT SURGERY Left 2022   [facility]   KNEE ARTHROPLASTY Left 02/19/2022   Procedure: COMPUTER ASSISTED TOTAL KNEE ARTHROPLASTY;  Surgeon: Fidel Rogue, MD;  Location: [facility];  Service: Orthopedics;  Laterality: Left;   KNEE ARTHROPLASTY Right 12/11/2022   Procedure:  RIGHT COMPUTER ASSISTED TOTAL KNEE ARTHROPLASTY;  Surgeon: Fidel Rogue, MD;  Location: [facility];  Service: Orthopedics;  Laterality: Right;   KNEE ARTHROSCOPY W/ PARTIAL MEDIAL MENISCECTOMY  05/01/2010   right    Family History: Family History  Problem Relation Age of Onset   Diabetes Mother    Breast cancer Mother        She is not sure about this diagnosis   Cancer Father        Laryngeal   Multiple sclerosis Sister        not clear if this is her diagnosis   Bipolar disorder Maternal Aunt    Tremor Maternal Aunt    Alcoholism Maternal Uncle    Schizophrenia Maternal Grandfather     Social History:  Flooring in bedroom: laminate Pets: none  Tobacco use/exposure: none Job: disabled   Medication List:  Allergies as of 07/24/2023       Reactions   Chocolate Hives   Penicillins Hives, Other (See Comments)      Orange Hives   Strawberry Extract Swelling   Tomato Hives, Other (See Comments)   acid foods        Medication List        Accurate as of July 24, 2023  3:14 PM. If you have any questions, ask your nurse or doctor.          STOP taking these medications    Abilify  Maintena 400 MG Prsy prefilled syringe Generic drug: ARIPiprazole  ER Stopped by: Arleta SHAUNNA Blanch       TAKE these medications    Accu-Chek Softclix Lancets lancets Use as instructed   Advair  Diskus 100-50 MCG/ACT Aepb Generic drug: fluticasone -salmeterol INHALE 1 PUFF BY MOUTH EVERY 12 HOURS AND RINSE MOUTH AFTER USE   albuterol  108 (90 Base) MCG/ACT inhaler Commonly known as: Ventolin  HFA Inhale 1-2 puffs into the lungs every 6 (six) hours as needed for wheezing or shortness of breath. What changed: See the new instructions. Changed by: Arleta SHAUNNA Blanch   amLODipine  10 MG tablet Commonly known as: NORVASC  Take 1 tablet (10 mg total) by mouth daily. 1 tab by mouth daily   atenolol  100 MG tablet Commonly known as: TENORMIN  TAKE ONE TABLET BY MOUTH DAILY   atorvastatin  40  MG tablet Commonly known as: LIPITOR Take 1 tablet (40 mg total) by mouth at bedtime.   Breztri  Aerosphere 160-9-4.8 MCG/ACT Aero Generic drug: Budeson-Glycopyrrol-Formoterol  Inhale 2 puffs into the lungs in the morning and at bedtime. Started by: Arleta SHAUNNA Blanch   cetirizine  10 MG tablet Commonly known as: ZYRTEC  TAKE ONE TABLET BY MOUTH DAILY FOR ALLERGIES AS NEEDED   Colace 100 MG capsule Generic drug: docusate sodium  Take 100 mg by mouth daily as needed.   cyclobenzaprine  5 MG tablet Commonly known as: FLEXERIL   TAKE ONE TABLET BY MOUTH AT BEDTIME AS NEEDED   diclofenac  Sodium 1 % Gel Commonly known as: VOLTAREN  APPLY TWO GRAMS TO THE KNEES UP TO FOUR TIMES A DAY AS NEEDED FOR PAIN   empagliflozin  10 MG Tabs tablet Commonly known as: Jardiance  Take 1 tablet (10 mg total) by mouth daily before breakfast.   gabapentin  300 MG capsule Commonly known as: NEURONTIN  Take 300 mg by mouth 3 (three) times daily.   hydrochlorothiazide  25 MG tablet Commonly known as: HYDRODIURIL  TAKE ONE TABLET BY MOUTH DAILY IN THE MORNING WITH BREAKFAST   Ingrezza  80 MG capsule Generic drug: valbenazine  Take 80 mg by mouth at bedtime.   lamoTRIgine  100 MG tablet Commonly known as: LAMICTAL  Take 100 mg by mouth daily.   losartan  100 MG tablet Commonly known as: COZAAR  TAKE ONE TABLET BY MOUTH DAILY IN THE MORNING   melatonin 3 MG Tabs tablet Take 1 tablet (3 mg total) by mouth at bedtime.   metFORMIN  1000 MG tablet Commonly known as: GLUCOPHAGE  TAKE ONE TABLET BY MOUTH TWICE A DAY WITH MEAL   omeprazole  40 MG capsule Commonly known as: PRILOSEC TAKE ONE CAPSULE BY MOUTH DAILY IN THE MORNING HALF HOUR BEFORE BREAKFAST AND OTHER MEDS   ondansetron  4 MG disintegrating tablet Commonly known as: ZOFRAN -ODT Take 1 tablet (4 mg total) by mouth every 6 (six) hours as needed for nausea or vomiting.   prazosin  1 MG capsule Commonly known as: MINIPRESS  Take 1 mg by mouth at bedtime.    trazodone  300 MG tablet Commonly known as: DESYREL  Take 300 mg by mouth at bedtime.         REVIEW OF SYSTEMS: Pertinent positives and negatives discussed in HPI.   Objective:   Physical Exam: BP 128/84 (BP Location: Left Arm, Patient Position: Sitting, Cuff Size: Large)   Pulse 60   Temp 98 F (36.7 C) (Temporal)   Resp 16   Ht 5' 4 (1.626 m)   Wt 227 lb 12.8 oz (103.3 kg)   SpO2 96%   BMI 39.10 kg/m  Body mass index is 39.1 kg/m. GEN: alert, well developed HEENT: clear conjunctiva, TM grey and translucent, nose with + mild inferior turbinate hypertrophy, pink nasal mucosa, slight clear rhinorrhea, + cobblestoning HEART: regular rate and rhythm, no murmur LUNGS: clear to auscultation bilaterally, no coughing, unlabored respiration ABDOMEN: soft, non distended  SKIN: no rashes or lesions  Spirometry:  Tracings reviewed. Her effort: It was hard to get consistent efforts and there is a question as to whether this reflects a maximal maneuver. FVC: 1.37L, 49% predicted FEV1: 1.07L, 48% predicted FEV1/FVC ratio: 78% Interpretation: Spirometry consistent with possible restrictive disease.  Please see scanned spirometry results for details.   Assessment:   1. Other allergic rhinitis   2. Allergic urticaria due to ingested food   3. Moderate persistent asthma without complication     Plan/Recommendations:  Moderate Persistent Asthma Dyspnea: - MDI technique discussed and spacer given.  Suboptimal effort on spirometry, no obstruction but possibly restriction.  Agree with cardiology evaluation in setting of orthopnea.  Did discuss likely has some level of exercise intolerance.  Will try triple inhaler therapy but I am not sure if her dyspnea (with activity, rest, laying down) are from uncontrolled asthma as her spirometry shows no obstruction and has not really responded to Advair /Albuterol .  - Maintenance inhaler: stop Advair  once you get the new inhaler.  Start Breztri   160-9-4.4mcg 2 puffs twice daily.  - Rescue inhaler: Albuterol   2 puffs via spacer or 1 vial via nebulizer every 4-6 hours as needed for respiratory symptoms of cough, shortness of breath, or wheezing Asthma control goals:  Full participation in all desired activities (may need albuterol  before activity) Albuterol  use two times or less a week on average (not counting use with activity) Cough interfering with sleep two times or less a month Oral steroids no more than once a year No hospitalizations   Other Allergic Rhinitis: - Due to turbinate hypertrophy, seasonal symptoms and unresponsive to over the counter meds, will perform skin testing to identify aeroallergen triggers.   - Use Zyrtec  10 mg daily.  - Hold all anti-histamines (Xyzal , Allegra , Zyrtec , Claritin , Benadryl , Pepcid ) 3 days prior to next visit.    Food Reactions - Continue avoidance of strawberry/oranges.   - Initial rxn: hives with strawberry and oranges.    Follow up: 9AM for skin testing 1-55, strawberry, orange, no IDs    Arleta Blanch, MD [facility] 

## 2023-07-24 NOTE — Patient Instructions (Addendum)
 Moderate Persistent Asthma: - Maintenance inhaler: stop Advair  once you get the new inhaler.  Start Breztri  2 puffs twice daily with spacer.  - Rescue inhaler: Albuterol  2 puffs every 4-6 hours as needed for respiratory symptoms of shortness of breath, or wheezing Asthma control goals:  Full participation in all desired activities (may need albuterol  before activity) Albuterol  use two times or less a week on average (not counting use with activity) Cough interfering with sleep two times or less a month Oral steroids no more than once a year No hospitalizations   Other Allergic Rhinitis:  - Hold all anti-histamines (Xyzal , Allegra , Zyrtec , Claritin , Benadryl , Pepcid ) 3 days prior to next visit.    Food Reactions - Continue avoidance of strawberry/oranges.     Follow up: 9AM for skin testing on 2/14

## 2023-07-29 NOTE — Progress Notes (Deleted)
  Cardiology Office Note:  .   Date:  07/29/2023  ID:  Maureen Swanson, DOB [birth year], MRN [id number] PCP: Cain Saupe, MD  [facility] Providers Cardiologist:  Nicki Guadalajara, MD   History of Present Illness: .   Maureen Swanson is a 58 y.o. female with a past medical history of morbid obesity, diabetes, hypertension, hyperlipidemia, GERD, asthma, arthritis, sleep apnea.  Patient is followed by Dr. Tresa Endo, presents today for evaluation of shortness of breath.  Patient previously underwent echocardiogram in 12/2021 that showed EF 60-65%, no regional wall motion abnormalities, moderate LVH, grade 1 DD, normal RV function.  She also underwent coronary CTA on 01/02/2022 that showed a coronary calcium score of 0, normal coronary arteries.  She was last seen by Dr. Tresa Endo on 01/15/2022.  At that time, patient denied recent chest pain.  Remained on amlodipine, atenolol, hydrochlorothiazide, losartan for blood pressure control.  Also remained on atorvastatin for cholesterol therapy.  She had just completed her CPAP titration study and was starting CPAP therapy.  Shortness of breath -Echocardiogram from 12/2021 showed EF 60-65%, no wall motion abnormalities, moderate LVH, grade 1 DD, normal RV function.  Coronary CTA in 12/2021 was without CAD, coronary calcium score was 0  Hypertension -  -Continue amlodipine 10 mg daily, atenolol 100 mg daily, hydrochlorothiazide 25 mg daily, losartan 100 mg daily  Hyperlipidemia -Lipid panel from 12/2022 showed LDL 71, HDL 58, triglycerides 188, total cholesterol 160 -Continue Lipitor 40 mg daily  Morbid obesity -BMI 39.1  OSA  ROS: ***  Studies Reviewed: .        *** Risk Assessment/Calculations:   {Does this patient have ATRIAL FIBRILLATION?:[phone]} No BP recorded.  {Refresh Note OR Click here to enter BP  :1}***       Physical Exam:   VS:  There were no vitals taken for this visit.   Wt Readings from Last 3 Encounters:  07/24/23 227 lb 12.8 oz  (103.3 kg)  07/02/23 229 lb 3.2 oz (104 kg)  05/01/23 223 lb (101.2 kg)    GEN: Well nourished, well developed in no acute distress NECK: No JVD; No carotid bruits CARDIAC: ***RRR, no murmurs, rubs, gallops RESPIRATORY:  Clear to auscultation without rales, wheezing or rhonchi  ABDOMEN: Soft, non-tender, non-distended EXTREMITIES:  No edema; No deformity   ASSESSMENT AND PLAN: .   ***    {Are you ordering a CV Procedure (e.g. stress test, cath, DCCV, TEE, etc)?   Press F2        :[id number]}  Dispo: ***  Signed, Jonita Albee, PA-C

## 2023-07-30 ENCOUNTER — Ambulatory Visit (INDEPENDENT_AMBULATORY_CARE_PROVIDER_SITE_OTHER): Payer: MEDICAID

## 2023-07-30 ENCOUNTER — Encounter: Payer: Self-pay | Admitting: Podiatry

## 2023-07-30 ENCOUNTER — Ambulatory Visit (INDEPENDENT_AMBULATORY_CARE_PROVIDER_SITE_OTHER): Payer: MEDICAID | Admitting: Podiatry

## 2023-07-30 DIAGNOSIS — M76822 Posterior tibial tendinitis, left leg: Secondary | ICD-10-CM | POA: Diagnosis not present

## 2023-07-30 DIAGNOSIS — M79672 Pain in left foot: Secondary | ICD-10-CM

## 2023-07-30 DIAGNOSIS — M79671 Pain in right foot: Secondary | ICD-10-CM | POA: Diagnosis not present

## 2023-07-30 DIAGNOSIS — M25571 Pain in right ankle and joints of right foot: Secondary | ICD-10-CM

## 2023-07-30 DIAGNOSIS — M76821 Posterior tibial tendinitis, right leg: Secondary | ICD-10-CM | POA: Diagnosis not present

## 2023-07-30 DIAGNOSIS — M7662 Achilles tendinitis, left leg: Secondary | ICD-10-CM

## 2023-07-30 DIAGNOSIS — M7661 Achilles tendinitis, right leg: Secondary | ICD-10-CM

## 2023-07-30 NOTE — Progress Notes (Signed)
Chief Complaint  Patient presents with   ankle     She has had ankle pain for a while and some swelling.  She has not tried anything for pain.     HPI: 58 y.o. female presents today with bilateral foot and ankle pain. She states this has been going on for some time.  Denies any trauma or specific injury.  Gradual onset.  Does have left-sided weakness from prior stroke. Is diabetic. Last A1c on record 01/13/23 was 7.2.  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 x 12 yrs.   Sleep apnea    Stroke (HCC)    1995   Mild. no residual problems   Substance abuse (HCC)    crack cocaine:  extensive treatment with [facility] [place] from 2014 to 2018 for this and alcohol abuse.   TMJ (temporomandibular joint disorder)     Past Surgical History:  Procedure Laterality Date   BREAST EXCISIONAL BIOPSY Right    BREAST LUMPECTOMY WITH NEEDLE LOCALIZATION  04/19/2012   Procedure: BREAST LUMPECTOMY WITH NEEDLE LOCALIZATION;  Surgeon: Robyne Askew, MD;  Location: [facility];  Service: General;  Laterality: Right;   FOOT SURGERY Left 2022   [facility]   KNEE ARTHROPLASTY Left 02/19/2022   Procedure: COMPUTER ASSISTED TOTAL KNEE ARTHROPLASTY;  Surgeon: Samson Frederic, MD;  Location: [facility];  Service: Orthopedics;  Laterality: Left;   KNEE ARTHROPLASTY Right 12/11/2022   Procedure: RIGHT COMPUTER ASSISTED TOTAL KNEE ARTHROPLASTY;  Surgeon: Samson Frederic, MD;  Location: [facility];  Service: Orthopedics;  Laterality: Right;    KNEE ARTHROSCOPY W/ PARTIAL MEDIAL MENISCECTOMY  05/01/2010   right    Allergies  Allergen Reactions   Chocolate Hives   Penicillins Hives and Other (See Comments)        Orange Hives   Strawberry Extract Swelling   Tomato Hives and Other (See Comments)    "acid foods"    ROS denies any nausea, vomiting, fever, chills, chest pain, shortness of breath   Physical Exam: There were no vitals filed for this visit.  General: The patient is alert and oriented x3 in no acute distress.  Dermatology: Skin is warm, dry and supple bilateral lower extremities. Interspaces are clear of maceration and debris.    Vascular: Palpable pedal pulses bilaterally. Capillary refill within normal limits.  No erythema or calor.  Neurological: Protective sensation decreased  Musculoskeletal Exam: Tenderness on palpation of PT tendons bilaterally around medial ankle and at Navicular tuberosity.  Arch collapse on weightbearing noted. Decreased muscle strength to left lower extremity.  Ankle joint dorsiflexion less than 10 degrees with knee extended. Pes planus foot type. Right lower extremity tibial varum  Radiographic Exam: Bilateral foot and ankle radiographs weightbearing 07/30/23 Normal osseous mineralization.  Pes planus foot type noted.  Decreased calcaneal inclination angle.  Talar head uncoverage.  Increased cuboid abduction angle.  Right lower extremity tibial varum noted.  Assessment/Plan of Care: 1. Posterior tibial tendon dysfunction (PTTD) of both lower extremities   2. Foot pain, bilateral      No orders of the defined types were placed in this encounter.  None  Discussed clinical findings with patient today.  Radiographs reviewed with patient.  Lace up ankle brace x 2 dispensed the patient to be worn both sides when weightbearing.  Power steps dispensed the patient.  Patient would benefit from arch supports due to pes planus foot type and to help alleviate PT tendon pain.  Patient  states she can take over the counter ibuprofen, will have her do this over the next 2 weeks as well in addition to RICE therapy.  PT tendon dysfunction rehab instructions discussed with patient with written instructions dispensed.  Follow-up in approximately 4 weeks.   Tinsley Everman L. Marchia Bond, AACFAS [facility]     [address] [place], [place] [address]                Office [phone]  Fax [phone]

## 2023-07-31 ENCOUNTER — Ambulatory Visit: Payer: MEDICAID | Admitting: Internal Medicine

## 2023-07-31 DIAGNOSIS — J3089 Other allergic rhinitis: Secondary | ICD-10-CM | POA: Diagnosis not present

## 2023-07-31 DIAGNOSIS — L500 Allergic urticaria: Secondary | ICD-10-CM

## 2023-07-31 MED ORDER — AZELASTINE HCL 0.1 % NA SOLN: 2.0000 | Freq: Two times a day (BID) | NASAL | 5 refills | Status: DC | PRN

## 2023-07-31 MED ORDER — LEVOCETIRIZINE DIHYDROCHLORIDE 5 MG PO TABS: 5.0000 mg | ORAL_TABLET | Freq: Every evening | ORAL | 5 refills | Status: DC

## 2023-07-31 MED ORDER — FLUTICASONE PROPIONATE 50 MCG/ACT NA SUSP: 2.0000 | Freq: Every day | NASAL | 5 refills | Status: DC

## 2023-07-31 NOTE — Progress Notes (Signed)
FOLLOW UP Date of Service/Encounter:  07/31/23   Subjective:  Maureen Swanson (DOB: 1966/06/07) is a 58 y.o. female who [facility] for follow up for skin testing.   History obtained from: chart review and patient.  Anti histamines held.   Past Medical History: Past Medical History:  Diagnosis Date   Anxiety    Arthritis    back and knees   Asthma    daily and prn inhalers   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 x 12 yrs.   Sleep apnea    Stroke (HCC)    1995   Mild. no residual problems   Substance abuse (HCC)    crack cocaine:  extensive treatment with [facility] [place] from 2014 to 2018 for this and alcohol abuse.   TMJ (temporomandibular joint disorder)     Objective:  There were no vitals taken for this visit. There is no height or weight on file to calculate BMI. Physical Exam: GEN: alert, well developed HEENT: clear conjunctiva, MMM LUNGS: unlabored respiration  Skin Testing:  Skin prick testing was placed, which includes aeroallergens/foods, histamine control, and saline control.  Verbal consent was obtained prior to placing test.  Patient tolerated procedure well.  Allergy testing results were read and interpreted by myself, documented by clinical staff. Adequate positive and negative control.  Positive results to:  Results discussed with patient/family.  Airborne Adult Perc - 07/31/23 0848     Time Antigen Placed 0848    Allergen Manufacturer Waynette Buttery    Location Back    Number of Test 55    1. Control-Buffer 50% Glycerol Negative    2. Control-Histamine 3+    3. Bahia Negative     4. French [place] Negative    5. Johnson Negative    6. [place] Blue Negative    7. Meadow Fescue Negative    8. Perennial Rye Negative    9. Timothy Negative    10. Ragweed Mix Negative    11. Cocklebur Negative    12. Plantain,  English Negative    13. Baccharis Negative    14. Dog Fennel Negative    15. Russian Thistle Negative    16. Lamb's Quarters Negative    17. Sheep Sorrell Negative    18. Rough Pigweed Negative    19. Marsh Elder, Rough Negative    20. Mugwort, Common Negative    21. Box, Elder Negative    22. Cedar, red Negative    23. Sweet Gum Negative    24. Pecan Pollen Negative    25. Pine Mix Negative    26. Walnut, Black Pollen Negative    27. Red Mulberry Negative    28. Ash Mix Negative    29. Birch Mix Negative    30. Beech American Negative    31. Cottonwood, Guinea-Bissau Negative    32. Hickory, White Negative    33. Maple Mix Negative    34. Oak, [place] Mix Negative    35. Sycamore Eastern Negative    36. Alternaria  Alternata Negative    37. Cladosporium Herbarum Negative    38. Aspergillus Mix Negative    39. Penicillium Mix Negative    40. Bipolaris Sorokiniana (Helminthosporium) Negative    41. Drechslera Spicifera (Curvularia) Negative    42. Mucor Plumbeus Negative    43. Fusarium Moniliforme Negative    44. Aureobasidium Pullulans (pullulara) Negative    45. Rhizopus Oryzae Negative    46. Botrytis Cinera Negative    47. Epicoccum Nigrum Negative    48. Phoma Betae Negative    49. Dust Mite Mix Negative    50. Cat Hair 10,000 BAU/ml Negative    51.  Dog Epithelia Negative    52. Mixed Feathers Negative    53. Horse Epithelia Negative    54. Cockroach, German Negative    55. Tobacco Leaf Negative             Food Adult Perc - 07/31/23 0900     Time Antigen Placed 1610    Allergen Manufacturer Waynette Buttery    Location Back    Number of allergen test 2    55. Orange  Negative    60. Strawberry Negative              Assessment:   1.  Other allergic rhinitis   2. Allergic urticaria due to ingested food     Plan/Recommendations:  Other Allergic Rhinitis: - Due to turbinate hypertrophy, seasonal symptoms and unresponsive to over the counter meds, will perform skin testing to identify aeroallergen triggers.   - SPT 07/2023: negative - Use nasal saline rinses before nose sprays such as with Neilmed Sinus Rinse.  Use distilled water.   - Use Flonase 2 sprays each nostril daily. Aim upward and outward. - Use Azelastine 1-2 sprays each nostril twice daily as needed for runny nose, drainage, sneezing, congestion. Aim upward and outward. - Use Xyzal 10 mg daily.    Food Reactions - Consider reintroduction of strawberry/oranges.  - Initial rxn: hives with strawberry and oranges.  - SPT 07/2023: negative to strawberry/oranges   Moderate Persistent Asthma Dyspnea: - MDI technique discussed and spacer given.  Suboptimal effort on spirometry, no obstruction but possibly restriction.  Agree with cardiology evaluation in setting of orthopnea.  Did discuss likely has some level of exercise intolerance.  Will try triple inhaler therapy but I am not sure if her dyspnea (with activity, rest, laying down) are from uncontrolled asthma as her spirometry shows no obstruction and has not really responded to Advair/Albuterol.  - Maintenance inhaler: stop Advair once you get the new inhaler.  Start Breztri 160-9-4.23mcg 2 puffs twice daily.  - Rescue inhaler: Albuterol 2 puffs via spacer or 1 vial via nebulizer every 4-6 hours as needed for respiratory symptoms of cough, shortness of breath, or wheezing Asthma control goals:  Full participation in all desired activities (may need albuterol before activity) Albuterol use two times or less a week on average (not counting use with activity) Cough interfering with sleep two times or less a month Oral steroids no more than once a year No hospitalizations         Return in about 2 months (around  09/28/2023).  Alesia Morin, MD [facility]

## 2023-07-31 NOTE — Patient Instructions (Addendum)
Chronic Rhinitis: - SPT 07/2023: negative - Use nasal saline rinses before nose sprays such as with Neilmed Sinus Rinse.  Use distilled water.   - Use Flonase 2 sprays each nostril daily. Aim upward and outward. - Use Azelastine 1-2 sprays each nostril twice daily as needed for runny nose, drainage, sneezing, congestion. Aim upward and outward. - Use Xyzal 10 mg daily.    Food Reactions - Consider reintroduction of strawberry/oranges.  - Initial rxn: hives with strawberry and oranges.  - SPT 07/2023: negative to strawberry/oranges   Moderate Persistent Asthma Dyspnea: - Maintenance inhaler: stop Advair once you get the new inhaler.  Start Breztri 160-9-4.90mcg 2 puffs twice daily.  - Rescue inhaler: Albuterol 2 puffs via spacer or 1 vial via nebulizer every 4-6 hours as needed for respiratory symptoms of cough, shortness of breath, or wheezing Asthma control goals:  Full participation in all desired activities (may need albuterol before activity) Albuterol use two times or less a week on average (not counting use with activity) Cough interfering with sleep two times or less a month Oral steroids no more than once a year No hospitalizations

## 2023-08-03 ENCOUNTER — Ambulatory Visit: Payer: MEDICAID | Admitting: Cardiology

## 2023-08-11 ENCOUNTER — Other Ambulatory Visit: Payer: Self-pay | Admitting: Internal Medicine

## 2023-08-11 ENCOUNTER — Telehealth: Payer: Self-pay | Admitting: *Deleted

## 2023-08-11 DIAGNOSIS — E118 Type 2 diabetes mellitus with unspecified complications: Secondary | ICD-10-CM

## 2023-08-11 NOTE — Telephone Encounter (Signed)
 Patient called back and stated that she had just spoke with someone in regards to her [facility] prescription. She stated that the pharmacy said there was some paperwork that needed to be filled out and it has been faxed over to our office.

## 2023-08-13 NOTE — Telephone Encounter (Signed)
 I called the patient's pharmacy and they just needed a PA for the [facility] inhaler.

## 2023-08-13 NOTE — Telephone Encounter (Signed)
 Patient called back I informed her that a PA was needed, while waiting for the PA patient plans to pick up a Breztri sample from the [place] office. Can you please sign out 2 samples for the patient to pick up next week.

## 2023-08-14 ENCOUNTER — Telehealth: Payer: Self-pay

## 2023-08-14 ENCOUNTER — Other Ambulatory Visit (HOSPITAL_COMMUNITY): Payer: Self-pay

## 2023-08-14 NOTE — Telephone Encounter (Signed)
*  Asthma/Allergy  Pharmacy Patient Advocate Encounter   Received notification from Pt Calls Messages that prior authorization for Breztri Aerosphere 160-9-4.8MCG/ACT aerosol  is required/requested.   Insurance verification completed.   The patient is insured through [facility] .   Per test claim: PA required; PA submitted to above mentioned insurance via CoverMyMeds Key/confirmation #/EOC [id number] Status is pending

## 2023-08-14 NOTE — Telephone Encounter (Signed)
 PA request has been Submitted. New Encounter created for follow up. For additional info see Pharmacy Prior Auth telephone encounter from 02/28.

## 2023-08-16 NOTE — Progress Notes (Unsigned)
 Cardiology Office Note    Date:  08/18/2023  ID:  [name] SIEBELS, DOB 1965/10/09, MRN [id number] PCP:  Cain Saupe, MD  Cardiologist:  Nicki Guadalajara, MD  Electrophysiologist:  None   Chief Complaint: Chest pain   History of Present Illness: .    BEMNET TROVATO is a 58 y.o. female with visit-pertinent history of morbid obesity, diabetes, hypertension, hyperlipidemia, GERD, asthma, arthritis, sleep apnea.  Patient is followed by Dr. Tresa Endo, presents today for evaluation of shortness of breath.  Patient previously underwent echocardiogram in 12/2021 that showed EF 60-65%, no regional wall motion abnormalities, moderate LVH, grade 1 DD, normal RV function.  She also underwent coronary CTA on 01/02/2022 that showed a coronary calcium score of 0, normal coronary arteries.  She was last seen by Dr. Tresa Endo on 01/15/2022.  At that time, patient denied recent chest pain.  Remained on amlodipine, atenolol, hydrochlorothiazide, losartan for blood pressure control.  Also remained on atorvastatin for cholesterol therapy.  She had just completed her CPAP titration study and was starting CPAP therapy.  Today she presents regarding chest pain.  She reports that she has been having an occasional sharp pain in her upper left chest into her shoulder for two weeks. She rubs the area which causes increased discomfort and when she moves her left shoulder. She notes that lasts for a few seconds then resolves, does not occur with exertion.  She has been using a stationary bike three times a week, tolerating well.  Patient notes that her asthma has been causing some problems lately, some of her inhalers have not been covered by her insurance, she is working with her PCP to resolve this.  She also reports that she has not been using her CPAP, and is planned to undergo a repeat sleep study.  Labwork independently reviewed: 05/01/2023: Hemoglobin 11.5, hematocrit 37, sodium 140, potassium 3.6, creatinine 1.12, AST 16, ALT 12 ROS: .    Today she denies lower extremity edema, fatigue, palpitations, melena, hematuria, hemoptysis, diaphoresis, weakness, presyncope, syncope, orthopnea, and PND.  All other systems are reviewed and otherwise negative. Studies Reviewed: Marland Kitchen    EKG:  EKG is ordered today, personally reviewed, demonstrating EKG Interpretation Date/Time:  Tuesday August 18 2023 15:28:35 EST Ventricular Rate:  61 PR Interval:  164 QRS Duration:  92 QT Interval:  424 QTC Calculation: 426 R Axis:   -6  Text Interpretation: Normal sinus rhythm Moderate voltage criteria for LVH, may be normal variant ( R in aVL , Cornell product ) No significant change since last tracing Confirmed by Reather Littler [phone]) on 08/18/2023 3:52:34 PM    CV Studies:  Cardiac Studies & Procedures   ______________________________________________________________________________________________   STRESS TESTS  NM MYOCAR MULTI W/SPECT W 10/31/2008  Narrative Clinical Data:  Unstable angina  MYOCARDIAL IMAGING WITH SPECT (REST AND PHARMACOLOGIC-STRESS) GATED LEFT VENTRICULAR WALL MOTION STUDY LEFT VENTRICULAR EJECTION FRACTION  Technique:  Standard myocardial SPECT imaging was performed after resting intravenous injection of 10 mCi Tc-48m tetrofosmin. Subsequently, intravenous infusion of elects to scan was performed under the supervision of the Cardiology staff.  At peak effect of the drug, 30 mCi Tc-22m tetrofosmin was injected intravenously and standard myocardial SPECT  imaging was performed.  Quantitative gated imaging was also performed to evaluate left ventricular wall motion, and estimate left ventricular ejection fraction.  Comparison: None  Findings:  Spect:  Breast attenuation artifact.  No perfusion defects.  Wall motion:  Normal wall motion.  Ejection fraction:  72% with  an end diastolic volume of 68 ml and end-systolic volume of 19 ml.  IMPRESSION: No perfusion defects.  Provider: [place] Reece, Hannah Beat    ECHOCARDIOGRAM  ECHOCARDIOGRAM COMPLETE 01/01/2022  Narrative ECHOCARDIOGRAM REPORT    Patient Name:   GWENITH TSCHIDA  Date of Exam: 01/01/2022 Medical Rec #:  [id number]     Height:       63.5 in Accession #:    [id number]    Weight:       246.0 lb Date of Birth:  June 02, 1966     BSA:          2.124 m Patient Age:    56 years      BP:           112/78 mmHg Patient Gender: F             HR:           64 bpm. Exam Location:  [address]  Procedure: 2D Echo, Cardiac Doppler and Color Doppler  Indications:    R06.02 SOB  History:        Patient has no prior history of Echocardiogram examinations. CHF, Stroke; Risk Factors:Hypertension, Diabetes, Dyslipidemia, Sleep Apnea and Former Smoker. Asthma.  Sonographer:    Cathie Beams RCS Referring Phys: Lennette Bihari  IMPRESSIONS   1. Left ventricular ejection fraction, by estimation, is 60 to 65%. The left ventricle has normal function. The left ventricle has no regional wall motion abnormalities. There is moderate left ventricular hypertrophy. Left ventricular diastolic parameters are consistent with Grade I diastolic dysfunction (impaired relaxation). 2. Right ventricular systolic function is normal. The right ventricular size is normal. Tricuspid regurgitation signal is inadequate for assessing PA pressure. 3. The mitral valve is normal in structure. No evidence of mitral valve regurgitation. 4. The aortic valve is tricuspid. Aortic valve regurgitation is not visualized. 5. The inferior vena cava is normal in size with greater than 50% respiratory variability, suggesting right atrial pressure of 3 mmHg.  FINDINGS Left Ventricle: Left ventricular ejection fraction, by estimation, is 60 to 65%. The left ventricle has normal function. The left ventricle has no regional wall motion abnormalities. The left ventricular internal cavity size was normal in size. There is moderate left ventricular hypertrophy. Left ventricular diastolic  parameters are consistent with Grade I diastolic dysfunction (impaired relaxation).  Right Ventricle: The right ventricular size is normal. No increase in right ventricular wall thickness. Right ventricular systolic function is normal. Tricuspid regurgitation signal is inadequate for assessing PA pressure.  Left Atrium: Left atrial size was normal in size.  Right Atrium: Right atrial size was normal in size.  Pericardium: There is no evidence of pericardial effusion.  Mitral Valve: The mitral valve is normal in structure. No evidence of mitral valve regurgitation.  Tricuspid Valve: The tricuspid valve is normal in structure. Tricuspid valve regurgitation is not demonstrated.  Aortic Valve: The aortic valve is tricuspid. Aortic valve regurgitation is not visualized.  Pulmonic Valve: The pulmonic valve was normal in structure. Pulmonic valve regurgitation is not visualized.  Aorta: The aortic root and ascending aorta are structurally normal, with no evidence of dilitation.  Venous: The inferior vena cava is normal in size with greater than 50% respiratory variability, suggesting right atrial pressure of 3 mmHg.  IAS/Shunts: No atrial level shunt detected by color flow Doppler.   LEFT VENTRICLE PLAX 2D LVIDd:         4.40 cm   Diastology LVIDs:  2.90 cm   LV e' medial:    8.49 cm/s LV PW:         1.20 cm   LV E/e' medial:  8.3 LV IVS:        1.40 cm   LV e' lateral:   9.46 cm/s LVOT diam:     2.00 cm   LV E/e' lateral: 7.4 LV SV:         76 LV SV Index:   36 LVOT Area:     3.14 cm   RIGHT VENTRICLE RV Basal diam:  3.00 cm RV S prime:     10.40 cm/s TAPSE (M-mode): 1.4 cm  LEFT ATRIUM             Index        RIGHT ATRIUM           Index LA diam:        3.80 cm 1.79 cm/m   RA Area:     13.30 cm LA Vol (A2C):   32.3 ml 15.21 ml/m  RA Volume:   32.50 ml  15.30 ml/m LA Vol (A4C):   34.4 ml 16.20 ml/m LA Biplane Vol: 34.8 ml 16.38 ml/m AORTIC VALVE LVOT Vmax:    130.00 cm/s LVOT Vmean:  87.800 cm/s LVOT VTI:    0.243 m  AORTA Ao Root diam: 3.10 cm Ao Asc diam:  3.30 cm  MITRAL VALVE MV Area (PHT): 3.56 cm    SHUNTS MV Decel Time: 213 msec    Systemic VTI:  0.24 m MV E velocity: 70.20 cm/s  Systemic Diam: 2.00 cm MV A velocity: 91.70 cm/s MV E/A ratio:  0.77  Photographer signed by Carolan Clines Signature Date/Time: 01/01/2022/1:41:55 PM    Final      CT SCANS  CT CORONARY MORPH W/CTA COR W/SCORE 01/02/2022  Addendum 01/05/2022 10:28 PM ADDENDUM REPORT: 01/05/2022 22:26  CLINICAL DATA:  Chest pain  EXAM: Cardiac/Coronary CTA  TECHNIQUE: A non-contrast, gated CT scan was obtained with axial slices of 3 mm through the heart for calcium scoring. Calcium scoring was performed using the Agatston method. A 120 kV prospective, gated, contrast cardiac scan was obtained. Gantry rotation speed was 250 msecs and collimation was 0.6 mm. Two sublingual nitroglycerin tablets (0.8 mg) were given. The 3D data set was reconstructed in 5% intervals of the 35-75% of the R-R cycle. Diastolic phases were analyzed on a dedicated workstation using MPR, MIP, and VRT modes. The patient received 95 cc of contrast.  FINDINGS: Image quality: Excellent.  Noise artifact is: Limited.  Coronary Arteries:  Normal coronary origin.  Right dominance.  Left main: The left main is a large caliber vessel with a normal take off from the left coronary cusp that bifurcates to form a left anterior descending artery and a left circumflex artery. trifurcates into a LAD, LCX, and ramus intermedius. There is no plaque or stenosis.  Left anterior descending artery: The LAD is patent without evidence of plaque or stenosis. The LAD gives off 2 patent diagonal branches.  Ramus intermedius: Patent with no evidence of plaque or stenosis.  Left circumflex artery: The LCX is non-dominant and patent with no evidence of plaque or stenosis. The LCX gives  off 2 patent obtuse marginal branches.  Right coronary artery: The RCA is dominant with normal take off from the right coronary cusp. There is no evidence of plaque or stenosis. The RCA terminates as a PDA and right posterolateral branch without evidence of plaque  or stenosis.  Right Atrium: Right atrial size is within normal limits.  Right Ventricle: The right ventricular cavity is within normal limits.  Left Atrium: Left atrial size is normal in size with no left atrial appendage filling defect.  Left Ventricle: The ventricular cavity size is within normal limits. There are no stigmata of prior infarction. There is no abnormal filling defect.  Pulmonary arteries: Normal in size without proximal filling defect.  Pulmonary veins: Normal pulmonary venous drainage.  Pericardium: Normal thickness with no significant effusion or calcium present.  Cardiac valves: The aortic valve is trileaflet without significant calcification. The mitral valve is normal structure without significant calcification.  Aorta: Normal caliber with no significant disease.  Extra-cardiac findings: See attached radiology report for non-cardiac structures.  IMPRESSION: 1. Coronary calcium score of 0. This was 0 percentile for age-, sex, and race-matched controls.  2.  Normal coronary origin with right dominance.  3.  Normal coronary arteries.  CAD RADS 0.  4.  Consider non atherosclerotic causes of chest pain.  RECOMMENDATIONS: 1. CAD-RADS 0: No evidence of CAD (0%). Consider non-atherosclerotic causes of chest pain.  2. CAD-RADS 1: Minimal non-obstructive CAD (0-24%). Consider non-atherosclerotic causes of chest pain. Consider preventive therapy and risk factor modification.  3. CAD-RADS 2: Mild non-obstructive CAD (25-49%). Consider non-atherosclerotic causes of chest pain. Consider preventive therapy and risk factor modification.  4. CAD-RADS 3: Moderate stenosis. Consider  symptom-guided anti-ischemic pharmacotherapy as well as risk factor modification per guideline directed care. Additional analysis with CT FFR will be submitted.  5. CAD-RADS 4: Severe stenosis. (70-99% or > 50% left main). Cardiac catheterization or CT FFR is recommended. Consider symptom-guided anti-ischemic pharmacotherapy as well as risk factor modification per guideline directed care. Invasive coronary angiography recommended with revascularization per published guideline statements.  6. CAD-RADS 5: Total coronary occlusion (100%). Consider cardiac catheterization or viability assessment. Consider symptom-guided anti-ischemic pharmacotherapy as well as risk factor modification per guideline directed care.  7. CAD-RADS N: Non-diagnostic study. Obstructive CAD can't be excluded. Alternative evaluation is recommended.  Armanda Magic, MD   Electronically Signed By: Armanda Magic M.D. On: 01/05/2022 22:26  Narrative EXAM: OVER-READ INTERPRETATION  CT CHEST  The following report is a limited chest CT over-read performed by radiologist Dr. Charlett Nose of [facility] on 01/02/2022. This over-read does not include interpretation of cardiac or coronary anatomy or pathology. The coronary CTA interpretation by the cardiologist is attached.  COMPARISON:  08/03/2016  FINDINGS: Vascular: Heart is normal size.  Aorta normal caliber.  Mediastinum/Nodes: No adenopathy  Lungs/Pleura: No confluent opacity or effusion.  Upper Abdomen: No acute findings  Musculoskeletal: Chest wall soft tissues are unremarkable. No acute bony abnormality.  IMPRESSION: No acute or significant extracardiac abnormality.  Electronically Signed: By: Charlett Nose M.D. On: 01/02/2022 16:14     ______________________________________________________________________________________________        Current Reported Medications:.    Current Meds  Medication Sig   Accu-Chek Softclix  Lancets lancets CHECK BLOOD SUGAR TWICE A DAY   ADVAIR DISKUS 100-50 MCG/ACT AEPB INHALE 1 PUFF BY MOUTH EVERY 12 HOURS AND RINSE MOUTH AFTER USE   albuterol (VENTOLIN HFA) 108 (90 Base) MCG/ACT inhaler Inhale 1-2 puffs into the lungs every 6 (six) hours as needed for wheezing or shortness of breath.   amLODipine (NORVASC) 10 MG tablet Take 1 tablet (10 mg total) by mouth daily. 1 tab by mouth daily   atenolol (TENORMIN) 100 MG tablet TAKE ONE TABLET BY MOUTH DAILY  atorvastatin (LIPITOR) 40 MG tablet Take 1 tablet (40 mg total) by mouth at bedtime.   azelastine (ASTELIN) 0.1 % nasal spray Place 2 sprays into both nostrils 2 (two) times daily as needed. Use in each nostril as directed   cyclobenzaprine (FLEXERIL) 5 MG tablet TAKE ONE TABLET BY MOUTH AT BEDTIME AS NEEDED   diclofenac Sodium (VOLTAREN) 1 % GEL APPLY TWO GRAMS TO THE KNEES UP TO FOUR TIMES A DAY AS NEEDED FOR PAIN   docusate sodium (COLACE) 100 MG capsule Take 100 mg by mouth daily as needed.   empagliflozin (JARDIANCE) 10 MG TABS tablet Take 1 tablet (10 mg total) by mouth daily before breakfast.   gabapentin (NEURONTIN) 300 MG capsule Take 300 mg by mouth 3 (three) times daily.   glucose blood (ACCU-CHEK GUIDE TEST) test strip CHECK BLOOD SUGAR TWICE A DAY   hydrochlorothiazide (HYDRODIURIL) 25 MG tablet TAKE ONE TABLET BY MOUTH DAILY IN THE MORNING WITH BREAKFAST   lamoTRIgine (LAMICTAL) 100 MG tablet Take 100 mg by mouth daily.   levocetirizine (XYZAL) 5 MG tablet Take 1 tablet (5 mg total) by mouth every evening.   losartan (COZAAR) 100 MG tablet TAKE ONE TABLET BY MOUTH DAILY IN THE MORNING   melatonin 3 MG TABS tablet Take 1 tablet (3 mg total) by mouth at bedtime.   metFORMIN (GLUCOPHAGE) 1000 MG tablet TAKE ONE TABLET BY MOUTH TWICE A DAY WITH MEAL   omeprazole (PRILOSEC) 40 MG capsule TAKE ONE CAPSULE BY MOUTH DAILY IN THE MORNING HALF HOUR BEFORE BREAKFAST AND OTHER MEDS   ondansetron (ZOFRAN-ODT) 4 MG disintegrating  tablet Take 1 tablet (4 mg total) by mouth every 6 (six) hours as needed for nausea or vomiting.   prazosin (MINIPRESS) 1 MG capsule Take 1 mg by mouth at bedtime.   trazodone (DESYREL) 300 MG tablet Take 300 mg by mouth at bedtime.   valbenazine (INGREZZA) 80 MG capsule Take 80 mg by mouth at bedtime.   Physical Exam:   VS:  BP 112/68   Pulse 61   Ht 5\' 4"  (1.626 m)   Wt 228 lb 6.4 oz (103.6 kg)   SpO2 97%   BMI 39.20 kg/m    Wt Readings from Last 3 Encounters:  08/18/23 228 lb 6.4 oz (103.6 kg)  07/24/23 227 lb 12.8 oz (103.3 kg)  07/02/23 229 lb 3.2 oz (104 kg)    GEN: Well nourished, well developed in no acute distress NECK: No JVD; No carotid bruits CARDIAC: RRR, no murmurs, rubs, gallops, chest sensitive to palpation  RESPIRATORY:  Clear to auscultation without rales, wheezing or rhonchi  ABDOMEN: Soft, non-tender, non-distended EXTREMITIES:  No edema; No acute deformity  Asessement and Plan:.   Precordial pain: Coronary CTA in 12/2021 was without CAD, coronary calcium of 0.  Today patient reports upper left chest discomfort that moves into her shoulder, that worsens with palpation and when moving her left arm.  She notes it is uncomfortable when she tries to sleep at night.  On exam pain was reproduced with moving of her arm and palpation.  EKG is unchanged from priors.  Overall reassuring that this is not cardiac in nature.  Recommended that patient continue to monitor and follow-up with her primary care if no improvement.  Shortness of breath: Echocardiogram from 12/2021 showed EF 60 to 65%, no wall motion abnormalities, moderate LVH, grade 1 DD, normal RV function.  Coronary CTA in 12/2021 was without CAD, coronary calcium of 0. Today patient reports that  she has been having some recent problems with her asthma, she is working with her PCP to resolve this.  Hypertension: Blood pressure today 112/68. Continue amlodipine 10 mg daily, atenolol 100 mg daily, hydrochlorothiazide 25 mg  daily and losartan 100 mg daily.  Hyperlipidemia: Last lipid profile from 12/2022 showed LDL 71, HDL 58, triglycerides 188 and total cholesterol 160.  Continue Lipitor 40 mg daily.  Obesity: BMI 39.1. Heart healthy diet and regular cardiovascular exercise encouraged.    OSA: Patient reports she is no longer using her CPAP. She is planning to have a sleep study per her PCP.     Disposition: F/u as needed, per patient request.   Signed, Rip Harbour, NP

## 2023-08-17 NOTE — Telephone Encounter (Signed)
 Pharmacy Patient Advocate Encounter  Received notification from [facility] that Prior Authorization for  [facility] Aerosphere 160-9-4.8MCG/ACT aerosol has been DENIED.  Full denial letter will be uploaded to the media tab. See denial reason below.  Here are the policy requirements your request did not meet:   The request does not meet the [facility] Preferred Drug List (PDL) trial and failure criteria for the use of a non-preferred drug. To meet the criteria for approval, you must first try one of the following preferred drugs on the [employer] Preferred Drug List (PDL) (this is a list of covered drugs):  Dulera Inhaler, Symbicort Inhaler  Our pharmacy records and information sent in by your doctor do not show that these drugs were tried and whether or not they helped your condition. If you have already tried these drugs and they caused side effects, did not work, or there is a reason that you cannot take these drugs, please ask your doctor to send [place] this information.   PA #/Case ID/Reference #: [id number]

## 2023-08-17 NOTE — Telephone Encounter (Signed)
 Maureen Swanson has been denied as patient must try and fail at least [facility] Inhaler, Symbicort Inhaler.  Please advise on which inhaler to switch to and dosage.  Thank you.

## 2023-08-18 ENCOUNTER — Other Ambulatory Visit: Payer: Self-pay | Admitting: Internal Medicine

## 2023-08-18 ENCOUNTER — Encounter: Payer: Self-pay | Admitting: Cardiology

## 2023-08-18 ENCOUNTER — Ambulatory Visit: Payer: MEDICAID | Attending: Cardiology | Admitting: Cardiology

## 2023-08-18 VITALS — BP 112/68 | HR 61 | Ht 64.0 in | Wt 228.4 lb

## 2023-08-18 DIAGNOSIS — R072 Precordial pain: Secondary | ICD-10-CM

## 2023-08-18 DIAGNOSIS — G4733 Obstructive sleep apnea (adult) (pediatric): Secondary | ICD-10-CM

## 2023-08-18 DIAGNOSIS — E782 Mixed hyperlipidemia: Secondary | ICD-10-CM | POA: Diagnosis not present

## 2023-08-18 DIAGNOSIS — R0602 Shortness of breath: Secondary | ICD-10-CM

## 2023-08-18 DIAGNOSIS — E785 Hyperlipidemia, unspecified: Secondary | ICD-10-CM

## 2023-08-18 DIAGNOSIS — I10 Essential (primary) hypertension: Secondary | ICD-10-CM

## 2023-08-18 DIAGNOSIS — R0609 Other forms of dyspnea: Secondary | ICD-10-CM

## 2023-08-18 MED ORDER — SPIRIVA RESPIMAT 1.25 MCG/ACT IN AERS: 2.0000 | INHALATION_SPRAY | Freq: Every day | RESPIRATORY_TRACT | 5 refills | Status: AC

## 2023-08-18 MED ORDER — BUDESONIDE-FORMOTEROL FUMARATE 160-4.5 MCG/ACT IN AERO: 2.0000 | INHALATION_SPRAY | Freq: Two times a day (BID) | RESPIRATORY_TRACT | 5 refills | Status: AC

## 2023-08-18 NOTE — Patient Instructions (Signed)
 Medication Instructions:  No medication changes were made during today's visit.  *If you need a refill on your cardiac medications before your next appointment, please call your pharmacy*   Lab Work: No labs were ordered during today's visit.  If you have labs (blood work) drawn today and your tests are completely normal, you will receive your results only by: MyChart Message (if you have MyChart) OR A paper copy in the mail If you have any lab test that is abnormal or we need to change your treatment, we will call you to review the results.   Testing/Procedures: No procedures were ordered during today's visit.    Follow-Up: At [facility], you and your health needs are our priority.  As part of our continuing mission to provide you with exceptional heart care, we have created designated Provider Care Teams.  These Care Teams include your primary Cardiologist (physician) and Advanced Practice Providers (APPs -  Physician Assistants and Nurse Practitioners) who all work together to provide you with the care you need, when you need it.  We recommend signing up for the patient portal called "MyChart".  Sign up information is provided on this After Visit Summary.  MyChart is used to connect with patients for Virtual Visits (Telemedicine).  Patients are able to view lab/test results, encounter notes, upcoming appointments, etc.  Non-urgent messages can be sent to your provider as well.   To learn more about what you can do with MyChart, go to [email or web address].    Your next appointment:   As needed for hear related concerns   Other Instructions        1st Floor: - Lobby - Registration  - Pharmacy  - Lab - Cafe   2nd Floor: - PV Lab - Diagnostic Testing (echo, CT, nuclear med)   3rd Floor: - Vacant   4th Floor: - TCTS (cardiothoracic surgery) - AFib Clinic - Structural Heart Clinic - Vascular Surgery  - Vascular Ultrasound   5th Floor: - HeartCare  Cardiology (general and EP) - Clinical Pharmacy for coumadin, hypertension, lipid, weight-loss medications, and med management appointments      Valet parking services will be available as well.       Thank you for choosing [facility]!

## 2023-08-18 NOTE — Progress Notes (Signed)
 Breztri [place] denied.  Will send in Symbicort + Spiriva.

## 2023-08-18 NOTE — Telephone Encounter (Signed)
 Called and notified patient of the note from Dr. Allena Katz. Patient confirmed that she will stop the Advair once it's completed.

## 2023-08-24 ENCOUNTER — Other Ambulatory Visit: Payer: Self-pay | Admitting: Internal Medicine

## 2023-08-24 ENCOUNTER — Telehealth: Payer: Self-pay

## 2023-08-24 NOTE — Telephone Encounter (Signed)
 Spoke with patient and was informed she is no longer a patient here

## 2023-08-28 ENCOUNTER — Encounter: Payer: Self-pay | Admitting: Podiatry

## 2023-08-28 ENCOUNTER — Encounter: Payer: Self-pay | Admitting: Physician Assistant

## 2023-08-28 ENCOUNTER — Ambulatory Visit (INDEPENDENT_AMBULATORY_CARE_PROVIDER_SITE_OTHER): Payer: MEDICAID | Admitting: Podiatry

## 2023-08-28 VITALS — Ht 64.0 in | Wt 228.0 lb

## 2023-08-28 DIAGNOSIS — M2141 Flat foot [pes planus] (acquired), right foot: Secondary | ICD-10-CM

## 2023-08-28 DIAGNOSIS — M2142 Flat foot [pes planus] (acquired), left foot: Secondary | ICD-10-CM | POA: Diagnosis not present

## 2023-08-28 DIAGNOSIS — M76821 Posterior tibial tendinitis, right leg: Secondary | ICD-10-CM | POA: Diagnosis not present

## 2023-08-28 DIAGNOSIS — M76822 Posterior tibial tendinitis, left leg: Secondary | ICD-10-CM

## 2023-08-28 MED ORDER — MELOXICAM 15 MG PO TABS: 15.0000 mg | ORAL_TABLET | Freq: Every day | ORAL | 0 refills | Status: DC

## 2023-08-28 NOTE — Progress Notes (Signed)
 Chief Complaint  Patient presents with   pttd    " I am about the same and having a lot of pain and stiffness and swelling in both feet"    HPI: 58 y.o. female presents today following up with bilateral PTTD with flatfoot pain.  States that the braces did not help very much.  Does have left-sided weakness from prior stroke. Is diabetic.   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 x 12 yrs.   Sleep apnea    Stroke (HCC)    1995   Mild. no residual problems   Substance abuse (HCC)    crack cocaine:  extensive treatment with [facility] [place] from 2014 to 2018 for this and alcohol abuse.   TMJ (temporomandibular joint disorder)     Past Surgical History:  Procedure Laterality Date   BREAST EXCISIONAL BIOPSY Right    BREAST LUMPECTOMY WITH NEEDLE LOCALIZATION  04/19/2012   Procedure: BREAST LUMPECTOMY WITH NEEDLE LOCALIZATION;  Surgeon: Robyne Askew, MD;  Location: [facility];  Service: General;  Laterality: Right;   FOOT SURGERY Left 2022   [facility]   KNEE ARTHROPLASTY Left 02/19/2022   Procedure: COMPUTER ASSISTED TOTAL KNEE ARTHROPLASTY;  Surgeon: Samson Frederic, MD;  Location: [facility];  Service: Orthopedics;  Laterality: Left;   KNEE ARTHROPLASTY Right 12/11/2022   Procedure: RIGHT COMPUTER ASSISTED TOTAL KNEE ARTHROPLASTY;  Surgeon: Samson Frederic, MD;  Location: [facility];  Service: Orthopedics;  Laterality: Right;   KNEE ARTHROSCOPY W/ PARTIAL MEDIAL MENISCECTOMY  05/01/2010   right     Allergies  Allergen Reactions   Chocolate Hives   Penicillins Hives and Other (See Comments)        Orange Hives   Strawberry Extract Swelling   Tomato Hives and Other (See Comments)    "acid foods"    ROS denies any nausea, vomiting, fever, chills, chest pain, shortness of breath   Physical Exam: There were no vitals filed for this visit.  General: The patient is alert and oriented x3 in no acute distress.  Dermatology: Skin is warm, dry and supple bilateral lower extremities. Interspaces are clear of maceration and debris.    Vascular: Palpable pedal pulses bilaterally. Capillary refill within normal limits.  No erythema or calor.  Neurological: Protective sensation decreased  Musculoskeletal Exam: Tenderness on palpation of PT tendons bilaterally around medial ankle and at Navicular tuberosity.  Arch collapse on weightbearing noted. Decreased muscle strength to left lower extremity.  Ankle joint dorsiflexion less than 10 degrees with knee extended. Pes planus foot type. Right lower extremity tibial varum  Radiographic Exam: Bilateral foot and ankle radiographs weightbearing 07/30/23 Normal osseous mineralization.  Pes planus foot type noted.  Decreased calcaneal inclination angle.  Talar head uncoverage.  Increased cuboid abduction angle.  Right lower extremity tibial varum noted.  Assessment/Plan of Care:  1. Posterior tibial tendon dysfunction (PTTD) of both lower extremities   2. Pes planus of both feet      Meds ordered this encounter  Medications   meloxicam (MOBIC) 15 MG tablet    Sig: Take 1 tablet (15 mg total) by mouth daily.    Dispense:  21 tablet    Refill:  0   None  Discussed clinical findings with patient today.  She has had difficulty with the braces having shoes that accommodate them but has not found them helpful  Power steps dispensed today.  Discussed importance of good supportive shoe gear.  PT tendon dysfunction rehab instructions  discussed with patient with written instructions dispensed.  She does not want to try formal PT at this time  May start short course of meloxicam.  Follow-up in approximately 3-4 weeks.   Thomasina Housley L. Marchia Bond, AACFAS [facility]     [address] [place], [place] [address]                Office [phone]  Fax [phone]

## 2023-09-07 ENCOUNTER — Encounter: Payer: Self-pay | Admitting: Podiatry

## 2023-09-07 ENCOUNTER — Ambulatory Visit (INDEPENDENT_AMBULATORY_CARE_PROVIDER_SITE_OTHER): Payer: MEDICAID | Admitting: Podiatry

## 2023-09-07 ENCOUNTER — Other Ambulatory Visit: Payer: Self-pay | Admitting: Internal Medicine

## 2023-09-07 VITALS — Ht 64.0 in | Wt 228.0 lb

## 2023-09-07 DIAGNOSIS — M79674 Pain in right toe(s): Secondary | ICD-10-CM

## 2023-09-07 DIAGNOSIS — M79675 Pain in left toe(s): Secondary | ICD-10-CM | POA: Diagnosis not present

## 2023-09-07 DIAGNOSIS — B351 Tinea unguium: Secondary | ICD-10-CM

## 2023-09-07 DIAGNOSIS — E1142 Type 2 diabetes mellitus with diabetic polyneuropathy: Secondary | ICD-10-CM | POA: Diagnosis not present

## 2023-09-07 NOTE — Progress Notes (Signed)
 This patient returns to my office for at risk foot care.  This patient requires this care by a professional since this patient will be at risk due to having diabetes.    This patient is unable to cut nails herself since the patient cannot reach her nails.These nails are painful walking and wearing shoes.  This patient presents for at risk foot care today.  General Appearance  Alert, conversant and in no acute stress.  Vascular  Dorsalis pedis and posterior tibial  pulses are palpable  bilaterally.  Capillary return is within normal limits  bilaterally. Temperature is within normal limits  bilaterally.  Neurologic  Senn-Weinstein monofilament wire test within normal limits  bilaterally. Muscle power within normal limits bilaterally.  Nails Thick disfigured discolored nails with subungual debris  from hallux to fifth toes bilaterally. No evidence of bacterial infection or drainage bilaterally.  Orthopedic  No limitations of motion  feet .  No crepitus or effusions noted.  No bony pathology or digital deformities noted.  Skin  normotropic skin with no porokeratosis noted bilaterally.  No signs of infections or ulcers noted.     Onychomycosis  Pain in right toes  Pain in left toes  Consent was obtained for treatment procedures.   Mechanical debridement of nails 1-5  bilaterally performed with a nail nipper.  Filed with dremel without incident.    Return office visit    3 months                  Told patient to return for periodic foot care and evaluation due to potential at risk complications.   Helane Gunther DPM

## 2023-09-18 ENCOUNTER — Ambulatory Visit: Payer: MEDICAID | Admitting: Podiatry

## 2023-09-20 ENCOUNTER — Emergency Department (HOSPITAL_COMMUNITY)
Admission: EM | Admit: 2023-09-20 | Discharge: 2023-09-20 | Disposition: A | Discharge status: Home / Self Care | Payer: MEDICAID

## 2023-09-20 ENCOUNTER — Emergency Department (HOSPITAL_COMMUNITY): Payer: MEDICAID

## 2023-09-20 ENCOUNTER — Other Ambulatory Visit: Payer: Self-pay

## 2023-09-20 ENCOUNTER — Encounter (HOSPITAL_COMMUNITY): Payer: Self-pay | Admitting: Emergency Medicine

## 2023-09-20 DIAGNOSIS — W19XXXA Unspecified fall, initial encounter: Secondary | ICD-10-CM | POA: Diagnosis not present

## 2023-09-20 DIAGNOSIS — S0990XA Unspecified injury of head, initial encounter: Secondary | ICD-10-CM | POA: Diagnosis present

## 2023-09-20 DIAGNOSIS — Z7984 Long term (current) use of oral hypoglycemic drugs: Secondary | ICD-10-CM | POA: Diagnosis not present

## 2023-09-20 DIAGNOSIS — E1165 Type 2 diabetes mellitus with hyperglycemia: Secondary | ICD-10-CM | POA: Diagnosis not present

## 2023-09-20 DIAGNOSIS — R739 Hyperglycemia, unspecified: Secondary | ICD-10-CM

## 2023-09-20 DIAGNOSIS — M25551 Pain in right hip: Secondary | ICD-10-CM | POA: Diagnosis not present

## 2023-09-20 LAB — CBC
HCT: 37.9 % (ref 36.0–46.0)
Hemoglobin: 11.7 g/dL — ABNORMAL LOW (ref 12.0–15.0)
MCH: 27.9 pg (ref 26.0–34.0)
MCHC: 30.9 g/dL (ref 30.0–36.0)
MCV: 90.5 fL (ref 80.0–100.0)
Platelets: 219 10*3/uL (ref 150–400)
RBC: 4.19 MIL/uL (ref 3.87–5.11)
RDW: 12.1 % (ref 11.5–15.5)
WBC: 6.6 10*3/uL (ref 4.0–10.5)
nRBC: 0 % (ref 0.0–0.2)

## 2023-09-20 LAB — CBG MONITORING, ED
Glucose-Capillary: 397 mg/dL — ABNORMAL HIGH (ref 70–99)
Glucose-Capillary: 172 mg/dL — ABNORMAL HIGH (ref 70–99)
Glucose-Capillary: 186 mg/dL — ABNORMAL HIGH (ref 70–99)
Glucose-Capillary: 176 mg/dL — ABNORMAL HIGH (ref 70–99)

## 2023-09-20 LAB — URINALYSIS, ROUTINE W REFLEX MICROSCOPIC
Bilirubin Urine: NEGATIVE
Glucose, UA: >=500 mg/dL — AB
Hgb urine dipstick: NEGATIVE
Ketones, ur: NEGATIVE mg/dL
Leukocytes,Ua: NEGATIVE
Nitrite: NEGATIVE
Protein, ur: NEGATIVE mg/dL
Specific Gravity, Urine: 1.022 (ref 1.005–1.030)
pH: 5 (ref 5.0–8.0)

## 2023-09-20 LAB — COMPREHENSIVE METABOLIC PANEL WITH GFR
ALT: 13 U/L (ref 0–44)
AST: 21 U/L (ref 15–41)
Albumin: 3.7 g/dL (ref 3.5–5.0)
Alkaline Phosphatase: 76 U/L (ref 38–126)
Anion gap: 14 (ref 5–15)
BUN: 18 mg/dL (ref 6–20)
CO2: 19 mmol/L — ABNORMAL LOW (ref 22–32)
Calcium: 9.5 mg/dL (ref 8.9–10.3)
Chloride: 101 mmol/L (ref 98–111)
Creatinine, Ser: 1.24 mg/dL — ABNORMAL HIGH (ref 0.44–1.00)
GFR, Estimated: 50 mL/min — ABNORMAL LOW (ref >60)
Glucose, Bld: 354 mg/dL — ABNORMAL HIGH (ref 70–99)
Potassium: 3.6 mmol/L (ref 3.5–5.1)
Sodium: 134 mmol/L — ABNORMAL LOW (ref 135–145)
Total Bilirubin: 0.6 mg/dL (ref 0.0–1.2)
Total Protein: 7 g/dL (ref 6.5–8.1)

## 2023-09-20 MED ORDER — LIDOCAINE 5 % EX PTCH: 1.0000 | MEDICATED_PATCH | CUTANEOUS | 0 refills | Status: AC

## 2023-09-20 MED ORDER — LACTATED RINGERS IV BOLUS
1000.0000 mL | Freq: Once | INTRAVENOUS | Status: AC
  Administered 2023-09-20: 1000 mL via INTRAVENOUS

## 2023-09-20 MED ORDER — METHOCARBAMOL 500 MG PO TABS: 500.0000 mg | ORAL_TABLET | Freq: Two times a day (BID) | ORAL | 0 refills | Status: AC

## 2023-09-20 MED ORDER — HYDROCODONE-ACETAMINOPHEN 5-325 MG PO TABS
1.0000 | ORAL_TABLET | Freq: Once | ORAL | Status: AC
  Administered 2023-09-20: 1 via ORAL
  Filled 2023-09-20: qty 1

## 2023-09-20 MED ORDER — INSULIN ASPART 100 UNIT/ML IJ SOLN
4.0000 [IU] | Freq: Once | INTRAMUSCULAR | Status: AC
  Administered 2023-09-20: 4 [IU] via INTRAVENOUS

## 2023-09-20 MED ORDER — ACETAMINOPHEN 500 MG PO TABS
1000.0000 mg | ORAL_TABLET | Freq: Once | ORAL | Status: AC
  Administered 2023-09-20: 1000 mg via ORAL
  Filled 2023-09-20: qty 2

## 2023-09-20 NOTE — ED Provider Triage Note (Signed)
 Emergency Medicine Provider Triage Evaluation Note  Maureen Swanson , a 58 y.o. female  was evaluated in triage.  Pt complains of fall, hip pain, elevated blood sugar.  Review of Systems  Positive: Right hip pain Negative: Nausea, vomiting  Physical Exam  BP 116/76 (BP Location: Right Arm)   Pulse 80   Temp 99.5 F (37.5 C) (Oral)   Resp 14   Ht 5\' 4"  (1.626 m)   Wt 106.6 kg   SpO2 97%   BMI 40.34 kg/m  Gen:   Awake, no distress   Resp:  Normal effort  MSK:   Moves extremities without difficulty  Other:  Ttp of palpation right hip  Medical Decision Making  Medically screening exam initiated at 12:34 PM.  Appropriate orders placed.  Maureen Swanson was informed that the remainder of the evaluation will be completed by another provider, this initial triage assessment does not replace that evaluation, and the importance of remaining in the ED until their evaluation is complete.  Workup initiated in triage    Olene Floss, [place] 09/20/23 1305

## 2023-09-20 NOTE — ED Notes (Signed)
 Awaiting patient from lobby.

## 2023-09-20 NOTE — ED Notes (Signed)
 Pt ambulated well to the restroom with cane.

## 2023-09-20 NOTE — ED Triage Notes (Signed)
 Pt BIB EMS from home. Fell 2 days ago after losing balance. Right hip pain tender to touch, no shorting or rotation. PT CBG 474. Pt is compliant with all meds. Pt states she drank 3 pepsis over the weekend.   EMS Vitals 118/88 80 pulse 96% room air

## 2023-09-20 NOTE — ED Provider Notes (Signed)
 Maureen Swanson EMERGENCY DEPARTMENT AT [facility] Provider Note   CSN: [id number] Arrival date & time: 09/20/23  1207   History  Chief Complaint  Patient presents with   Fall   Hyperglycemia    Maureen Swanson is a 58 y.o. female fall on Friday, stood up felt legs get tangled up and fell towards right side, unsure if hit head. Immediate pain in right hip. Was able to get up however with pain. Has some dizziness episodes. No HA, neck pain, CP, SOB, abd pain, weakness, numbness. Described hip pain as constant sharp pain to lateral right hip. Worse with movement, improved with rest however with some pain at rest as well. No meds PTA. EMS noted hyperglycemia, states she has been compliant with DM meds however CBG elevated this weekend.     HPI     Home Medications Prior to Admission medications   Medication Sig Start Date End Date Taking? Authorizing Provider  lidocaine (LIDODERM) 5 % Place 1 patch onto the skin daily. Remove & Discard patch within 12 hours or as directed by MD 09/20/23  Yes Judge Duque A, PA-C  methocarbamol (ROBAXIN) 500 MG tablet Take 1 tablet (500 mg total) by mouth 2 (two) times daily. 09/20/23  Yes Natanel Snavely A, PA-C  Accu-Chek Softclix Lancets lancets CHECK BLOOD SUGAR TWICE A DAY 08/11/23   Julieanne Manson, MD  ADVAIR DISKUS 100-50 MCG/ACT AEPB INHALE 1 PUFF BY MOUTH EVERY 12 HOURS AND RINSE MOUTH AFTER USE 12/19/22   Julieanne Manson, MD  albuterol (VENTOLIN HFA) 108 (90 Base) MCG/ACT inhaler Inhale 1-2 puffs into the lungs every 6 (six) hours as needed for wheezing or shortness of breath. 07/24/23   Birder Robson, MD  amLODipine (NORVASC) 10 MG tablet Take 1 tablet (10 mg total) by mouth daily. 1 tab by mouth daily 10/13/22   Julieanne Manson, MD  atenolol (TENORMIN) 100 MG tablet TAKE ONE TABLET BY MOUTH DAILY 04/06/23   Julieanne Manson, MD  atorvastatin (LIPITOR) 40 MG tablet Take 1 tablet (40 mg total) by mouth at bedtime. 10/13/22    Julieanne Manson, MD  azelastine (ASTELIN) 0.1 % nasal spray Place 2 sprays into both nostrils 2 (two) times daily as needed. Use in each nostril as directed 07/31/23   Birder Robson, MD  budesonide-formoterol [facility]) 160-4.5 MCG/ACT inhaler Inhale 2 puffs into the lungs in the morning and at bedtime. 08/18/23   Birder Robson, MD  cyclobenzaprine (FLEXERIL) 5 MG tablet TAKE ONE TABLET BY MOUTH AT BEDTIME AS NEEDED 12/19/22   Julieanne Manson, MD  diclofenac Sodium (VOLTAREN) 1 % GEL APPLY TWO GRAMS TO THE KNEES UP TO FOUR TIMES A DAY AS NEEDED FOR PAIN 05/04/23   Julieanne Manson, MD  docusate sodium (COLACE) 100 MG capsule Take 100 mg by mouth daily as needed. 06/05/23 10/03/23  [provider]  empagliflozin (JARDIANCE) 10 MG TABS tablet Take 1 tablet (10 mg total) by mouth daily before breakfast. 10/13/22   Julieanne Manson, MD  fluticasone [facility]) 50 MCG/ACT nasal spray Place 2 sprays into both nostrils daily. 07/31/23   Birder Robson, MD  gabapentin (NEURONTIN) 300 MG capsule Take 300 mg by mouth 3 (three) times daily.    [provider]  glucose blood (ACCU-CHEK GUIDE TEST) test strip CHECK BLOOD SUGAR TWICE A DAY 08/11/23   Julieanne Manson, MD  hydrochlorothiazide (HYDRODIURIL) 25 MG tablet TAKE ONE TABLET BY MOUTH DAILY IN THE MORNING WITH BREAKFAST 07/03/23   Julieanne Manson, MD  lamoTRIgine (LAMICTAL) 100 MG tablet Take 100 mg by mouth daily. 05/14/21   [provider]  levocetirizine (XYZAL) 5 MG tablet Take 1 tablet (5 mg total) by mouth every evening. 07/31/23   Birder Robson, MD  losartan (COZAAR) 100 MG tablet TAKE ONE TABLET BY MOUTH DAILY IN THE MORNING 07/03/23   Julieanne Manson, MD  melatonin 3 MG TABS tablet Take 1 tablet (3 mg total) by mouth at bedtime. 01/09/21   Laveda Abbe, NP  meloxicam (MOBIC) 15 MG tablet Take 1 tablet (15 mg total) by mouth daily. 08/28/23   Barbaraann Share, DPM  metFORMIN (GLUCOPHAGE) 1000 MG tablet  TAKE ONE TABLET BY MOUTH TWICE A DAY WITH MEAL 04/06/23   Julieanne Manson, MD  omeprazole (PRILOSEC) 40 MG capsule TAKE ONE CAPSULE BY MOUTH DAILY IN THE MORNING HALF HOUR BEFORE BREAKFAST AND OTHER MEDS 07/03/23   Julieanne Manson, MD  ondansetron (ZOFRAN-ODT) 4 MG disintegrating tablet Take 1 tablet (4 mg total) by mouth every 6 (six) hours as needed for nausea or vomiting. 05/01/23   Rising, Lurena Joiner, PA-C  prazosin (MINIPRESS) 1 MG capsule Take 1 mg by mouth at bedtime.    [provider]  Tiotropium Bromide Monohydrate (SPIRIVA RESPIMAT) 1.25 MCG/ACT AERS Inhale 2 puffs into the lungs daily. 08/18/23   Birder Robson, MD  trazodone (DESYREL) 300 MG tablet Take 300 mg by mouth at bedtime.    [provider]  valbenazine (INGREZZA) 80 MG capsule Take 80 mg by mouth at bedtime.    [provider]      Allergies    Chocolate, Penicillins, Orange, Strawberry extract, and Tomato    Review of Systems   Review of Systems  Constitutional: Negative.   HENT: Negative.    Respiratory: Negative.    Cardiovascular: Negative.   Gastrointestinal: Negative.   Genitourinary: Negative.   Musculoskeletal:        Right hip pain  All other systems reviewed and are negative.  Physical Exam Updated Vital Signs BP 123/68   Pulse 61   Temp 98 F (36.7 C) (Oral)   Resp 17   Ht 5\' 4"  (1.626 m)   Wt 106.6 kg   SpO2 100%   BMI 40.34 kg/m  Physical Exam Vitals and nursing note reviewed.  Constitutional:      General: She is not in acute distress.    Appearance: She is well-developed. She is not ill-appearing or diaphoretic.  HENT:     Head: Normocephalic and atraumatic.     Nose: Nose normal.     Mouth/Throat:     Mouth: Mucous membranes are moist.  Eyes:     Pupils: Pupils are equal, round, and reactive to light.  Cardiovascular:     Rate and Rhythm: Normal rate and regular rhythm.     Pulses: Normal pulses.          Radial pulses are 2+ on the right side and  2+ on the left side.       Dorsalis pedis pulses are 2+ on the right side and 2+ on the left side.     Heart sounds: Normal heart sounds.  Pulmonary:     Effort: Pulmonary effort is normal. No respiratory distress.     Breath sounds: Normal breath sounds.  Abdominal:     General: Bowel sounds are normal. There is no distension.     Palpations: Abdomen is soft.     Tenderness: There is no abdominal tenderness. There is  no right CVA tenderness, left CVA tenderness, guarding or rebound.  Musculoskeletal:        General: Normal range of motion.     Cervical back: Normal range of motion and neck supple.     Comments: No midline C/T/L tenderness Mild tenderness lateral aspect right hip.  Full range of motion.  No shortening or rotation of legs. Pelvis stable, nontender No bony tenderness to femur bilaterally.  Nontender bilateral upper extremities.  Skin:    General: Skin is warm and dry.     Capillary Refill: Capillary refill takes less than 2 seconds.  Neurological:     General: No focal deficit present.     Mental Status: She is alert and oriented to person, place, and time.     Cranial Nerves: No cranial nerve deficit.     Motor: No weakness.     Gait: Gait normal.  Psychiatric:        Mood and Affect: Mood normal.     ED Results / Procedures / Treatments   Labs (all labs ordered are listed, but only abnormal results are displayed) Labs Reviewed  CBC - Abnormal; Notable for the following components:      Result Value   Hemoglobin 11.7 (*)    All other components within normal limits  URINALYSIS, ROUTINE W REFLEX MICROSCOPIC - Abnormal; Notable for the following components:   Glucose, UA >=500 (*)    Bacteria, UA RARE (*)    All other components within normal limits  COMPREHENSIVE METABOLIC PANEL WITH GFR - Abnormal; Notable for the following components:   Sodium 134 (*)    CO2 19 (*)    Glucose, Bld 354 (*)    Creatinine, Ser 1.24 (*)    GFR, Estimated 50 (*)    All  other components within normal limits  CBG MONITORING, ED - Abnormal; Notable for the following components:   Glucose-Capillary 397 (*)    All other components within normal limits  CBG MONITORING, ED - Abnormal; Notable for the following components:   Glucose-Capillary 186 (*)    All other components within normal limits  CBG MONITORING, ED - Abnormal; Notable for the following components:   Glucose-Capillary 172 (*)    All other components within normal limits  CBG MONITORING, ED - Abnormal; Notable for the following components:   Glucose-Capillary 176 (*)    All other components within normal limits    EKG EKG Interpretation Date/Time:  Sunday September 20 2023 12:45:12 EDT Ventricular Rate:  78 PR Interval:  176 QRS Duration:  88 QT Interval:  384 QTC Calculation: 437 R Axis:   -10  Text Interpretation: Normal sinus rhythm Moderate voltage criteria for LVH, may be normal variant ( R in aVL , Cornell product ) Nonspecific T wave abnormality Abnormal ECG When compared with ECG of 18-Aug-2023 15:28, PREVIOUS ECG IS PRESENT Confirmed by Ernie Avena ([id number]) on 09/20/2023 5:43:01 PM  Radiology CT Hip Right Wo Contrast Result Date: 09/20/2023 CLINICAL DATA:  Fall and right hip pain. EXAM: CT OF THE RIGHT HIP WITHOUT CONTRAST TECHNIQUE: Multidetector CT imaging of the right hip was performed according to the standard protocol. Multiplanar CT image reconstructions were also generated. RADIATION DOSE REDUCTION: This exam was performed according to the departmental dose-optimization program which includes automated exposure control, adjustment of the mA and/or kV according to patient size and/or use of iterative reconstruction technique. COMPARISON:  Right hip radiograph dated 09/20/2023. FINDINGS: Bones/Joint/Cartilage No acute fracture or dislocation. Mild arthritic changes. No  joint effusion. Ligaments Suboptimally assessed by CT. Muscles and Tendons No acute findings. Soft tissues No acute  findings. IMPRESSION: No acute fracture or dislocation. Electronically Signed   By: Elgie Collard M.D.   On: 09/20/2023 17:20   CT HEAD WO CONTRAST ( ) Result Date: 09/20/2023 CLINICAL DATA:  Trauma. EXAM: CT HEAD WITHOUT CONTRAST CT CERVICAL SPINE WITHOUT CONTRAST TECHNIQUE: Multidetector CT imaging of the head and cervical spine was performed following the standard protocol without intravenous contrast. Multiplanar CT image reconstructions of the cervical spine were also generated. RADIATION DOSE REDUCTION: This exam was performed according to the departmental dose-optimization program which includes automated exposure control, adjustment of the mA and/or kV according to patient size and/or use of iterative reconstruction technique. COMPARISON:  Head CT dated 06/23/2021. FINDINGS: CT HEAD FINDINGS Brain: The ventricles and sulci are appropriate size for the patient's age. The gray-white matter discrimination is preserved. There is no acute intracranial hemorrhage. No mass effect or midline shift. No extra-axial fluid collection. Incidental note of an empty sella. Vascular: No hyperdense vessel or unexpected calcification. Skull: Normal. Negative for fracture or focal lesion. Sinuses/Orbits: No acute finding. Other: None CT CERVICAL SPINE FINDINGS Alignment: No acute subluxation. There is straightening of normal cervical lordosis which may be positional or due to muscle spasm. Skull base and vertebrae: No acute fracture. Soft tissues and spinal canal: No prevertebral fluid or swelling. No visible canal hematoma. Disc levels:  No acute findings.  Mild degenerative changes. Upper chest: Not visualized. Other: Bilateral carotid bulb calcified plaques. IMPRESSION: 1. No acute intracranial pathology. 2. No acute/traumatic cervical spine pathology. Electronically Signed   By: Elgie Collard M.D.   On: 09/20/2023 17:17   CT Cervical Spine Wo Contrast Result Date: 09/20/2023 CLINICAL DATA:  Trauma. EXAM: CT HEAD  WITHOUT CONTRAST CT CERVICAL SPINE WITHOUT CONTRAST TECHNIQUE: Multidetector CT imaging of the head and cervical spine was performed following the standard protocol without intravenous contrast. Multiplanar CT image reconstructions of the cervical spine were also generated. RADIATION DOSE REDUCTION: This exam was performed according to the departmental dose-optimization program which includes automated exposure control, adjustment of the mA and/or kV according to patient size and/or use of iterative reconstruction technique. COMPARISON:  Head CT dated 06/23/2021. FINDINGS: CT HEAD FINDINGS Brain: The ventricles and sulci are appropriate size for the patient's age. The gray-white matter discrimination is preserved. There is no acute intracranial hemorrhage. No mass effect or midline shift. No extra-axial fluid collection. Incidental note of an empty sella. Vascular: No hyperdense vessel or unexpected calcification. Skull: Normal. Negative for fracture or focal lesion. Sinuses/Orbits: No acute finding. Other: None CT CERVICAL SPINE FINDINGS Alignment: No acute subluxation. There is straightening of normal cervical lordosis which may be positional or due to muscle spasm. Skull base and vertebrae: No acute fracture. Soft tissues and spinal canal: No prevertebral fluid or swelling. No visible canal hematoma. Disc levels:  No acute findings.  Mild degenerative changes. Upper chest: Not visualized. Other: Bilateral carotid bulb calcified plaques. IMPRESSION: 1. No acute intracranial pathology. 2. No acute/traumatic cervical spine pathology. Electronically Signed   By: Elgie Collard M.D.   On: 09/20/2023 17:17   DG Hip Unilat W or Wo Pelvis 2-3 Views Right Result Date: 09/20/2023 CLINICAL DATA:  Fall and right hip pain. EXAM: DG HIP (WITH OR WITHOUT PELVIS) 2-3V RIGHT COMPARISON:  None Available. FINDINGS: No acute fracture or dislocation. The bones are osteopenic. Mild bilateral hip arthritic changes, right greater  than left. The soft tissues are  unremarkable. IMPRESSION: 1. No acute fracture or dislocation. 2. Mild bilateral hip arthritic changes, right greater than left. Electronically Signed   By: Elgie Collard M.D.   On: 09/20/2023 15:20    Procedures Procedures    Medications Ordered in ED Medications  lactated ringers bolus 1,000 mL (0 mLs Intravenous Stopped 09/20/23 1651)  acetaminophen (TYLENOL) tablet 1,000 mg (1,000 mg Oral Given 09/20/23 1410)  insulin aspart (novoLOG) injection 4 Units (4 Units Intravenous Given 09/20/23 1651)  HYDROcodone-acetaminophen (NORCO/VICODIN) 5-325 MG per tablet 1 tablet (1 tablet Oral Given 09/20/23 1651)   ED Course/ Medical Decision Making/ A&P    Mechanical fall on Friday, pain to right hip since.  Ambulatory.  Neurovascularly intact.  Noted be hyperglycemic with EMS.  She is asymptomatic states she has been drinking regular soda over the weekend.  Plan on labs and imaging  Labs imaging personally viewed interpreted UA negative for infection, no ketonuria Metabolic panel glucose 354, creatinine 124 no anion gap CBC without leukocytosis Imaging without significant abnormality  Patient received IV fluids, small insulin bolus.  Her pain is controlled.  She is ambulatory without difficulty.  Neurovascularly intact.  Encouraged to keep close eye on her blood sugars.  Will have her follow-up outpatient, return for any worsening symptoms.  Suspicion for occult fracture, dislocation, septic joint, VTE, ischemia, cauda equina, discitis, osteomized, abscess.  The patient has been appropriately medically screened and/or stabilized in the ED. I have low suspicion for any other emergent medical condition which would require further screening, evaluation or treatment in the ED or require inpatient management.  Patient is hemodynamically stable and in no acute distress.  Patient able to ambulate in department prior to ED.  Evaluation does not show acute pathology that  would require ongoing or additional emergent interventions while in the emergency department or further inpatient treatment.  I have discussed the diagnosis with the patient and answered all questions.  Pain is been managed while in the emergency department and patient has no further complaints prior to discharge.  Patient is comfortable with plan discussed in room and is stable for discharge at this time.  I have discussed strict return precautions for returning to the emergency department.  Patient was encouraged to follow-up with PCP/specialist refer to at discharge.                                   Medical Decision Making Amount and/or Complexity of Data Reviewed Independent Historian: EMS Labs: ordered. Decision-making details documented in ED Course. Radiology: ordered.  Risk OTC drugs. Prescription drug management.         Final Clinical Impression(s) / ED Diagnoses Final diagnoses:  Fall, initial encounter  Hyperglycemia    Rx / DC Orders ED Discharge Orders          Ordered    methocarbamol (ROBAXIN) 500 MG tablet  2 times daily        09/20/23 1929    lidocaine (LIDODERM) 5 %  Every 24 hours        09/20/23 1929              Dorothie Wah A, PA-C 09/20/23 2345    Ernie Avena, MD 09/21/23 0155

## 2023-09-20 NOTE — Discharge Instructions (Signed)
 Keep a close eye on your blood sugars at home  I have written you for some muscle relaxers and some lidocaine pain patches.  Follow-up outpatient, return for any worsening symptoms

## 2023-09-20 NOTE — ED Notes (Signed)
 Patient transported to X-ray

## 2023-09-29 ENCOUNTER — Ambulatory Visit: Payer: MEDICAID | Admitting: Internal Medicine

## 2023-10-12 ENCOUNTER — Other Ambulatory Visit: Payer: Self-pay | Admitting: Internal Medicine

## 2023-10-21 ENCOUNTER — Other Ambulatory Visit: Payer: Self-pay | Admitting: Family Medicine

## 2023-10-21 DIAGNOSIS — Z1231 Encounter for screening mammogram for malignant neoplasm of breast: Secondary | ICD-10-CM

## 2023-10-22 ENCOUNTER — Ambulatory Visit: Payer: MEDICAID | Admitting: Physician Assistant

## 2023-11-13 ENCOUNTER — Encounter: Payer: Self-pay | Admitting: Gastroenterology

## 2023-11-13 ENCOUNTER — Other Ambulatory Visit: Payer: MEDICAID

## 2023-11-13 ENCOUNTER — Ambulatory Visit: Payer: MEDICAID | Admitting: Gastroenterology

## 2023-11-13 VITALS — BP 114/68 | HR 72 | Ht 64.0 in | Wt 231.0 lb

## 2023-11-13 DIAGNOSIS — K5900 Constipation, unspecified: Secondary | ICD-10-CM

## 2023-11-13 DIAGNOSIS — R140 Abdominal distension (gaseous): Secondary | ICD-10-CM

## 2023-11-13 DIAGNOSIS — R1319 Other dysphagia: Secondary | ICD-10-CM | POA: Diagnosis not present

## 2023-11-13 DIAGNOSIS — D649 Anemia, unspecified: Secondary | ICD-10-CM | POA: Diagnosis not present

## 2023-11-13 DIAGNOSIS — K649 Unspecified hemorrhoids: Secondary | ICD-10-CM

## 2023-11-13 DIAGNOSIS — R112 Nausea with vomiting, unspecified: Secondary | ICD-10-CM

## 2023-11-13 DIAGNOSIS — K219 Gastro-esophageal reflux disease without esophagitis: Secondary | ICD-10-CM

## 2023-11-13 DIAGNOSIS — R1310 Dysphagia, unspecified: Secondary | ICD-10-CM | POA: Diagnosis not present

## 2023-11-13 DIAGNOSIS — K644 Residual hemorrhoidal skin tags: Secondary | ICD-10-CM

## 2023-11-13 DIAGNOSIS — R1030 Lower abdominal pain, unspecified: Secondary | ICD-10-CM

## 2023-11-13 DIAGNOSIS — Z1211 Encounter for screening for malignant neoplasm of colon: Secondary | ICD-10-CM

## 2023-11-13 LAB — COMPREHENSIVE METABOLIC PANEL WITH GFR
ALT: 12 U/L (ref 0–35)
AST: 14 U/L (ref 0–37)
Albumin: 4.3 g/dL (ref 3.5–5.2)
Alkaline Phosphatase: 95 U/L (ref 39–117)
BUN: 24 mg/dL — ABNORMAL HIGH (ref 6–23)
CO2: 26 meq/L (ref 19–32)
Calcium: 9.8 mg/dL (ref 8.4–10.5)
Chloride: 101 meq/L (ref 96–112)
Creatinine, Ser: 1.11 mg/dL (ref 0.40–1.20)
GFR: 54.88 mL/min — ABNORMAL LOW (ref >60.00)
Glucose, Bld: 266 mg/dL — ABNORMAL HIGH (ref 70–99)
Potassium: 4.1 meq/L (ref 3.5–5.1)
Sodium: 138 meq/L (ref 135–145)
Total Bilirubin: 0.3 mg/dL (ref 0.2–1.2)
Total Protein: 7.6 g/dL (ref 6.0–8.3)

## 2023-11-13 LAB — CBC WITH DIFFERENTIAL/PLATELET
Basophils Absolute: 0 10*3/uL (ref 0.0–0.1)
Basophils Relative: 0.2 % (ref 0.0–3.0)
Eosinophils Absolute: 0.1 10*3/uL (ref 0.0–0.7)
Eosinophils Relative: 1 % (ref 0.0–5.0)
HCT: 39.8 % (ref 36.0–46.0)
Hemoglobin: 12.8 g/dL (ref 12.0–15.0)
Lymphocytes Relative: 18.2 % (ref 12.0–46.0)
Lymphs Abs: 1.4 10*3/uL (ref 0.7–4.0)
MCHC: 32.1 g/dL (ref 30.0–36.0)
MCV: 87.9 fl (ref 78.0–100.0)
Monocytes Absolute: 0.4 10*3/uL (ref 0.1–1.0)
Monocytes Relative: 5.1 % (ref 3.0–12.0)
Neutro Abs: 6 10*3/uL (ref 1.4–7.7)
Neutrophils Relative %: 75.5 % (ref 43.0–77.0)
Platelets: 227 10*3/uL (ref 150.0–400.0)
RBC: 4.53 Mil/uL (ref 3.87–5.11)
RDW: 13.4 % (ref 11.5–15.5)
WBC: 7.9 10*3/uL (ref 4.0–10.5)

## 2023-11-13 LAB — FOLATE: Folate: 11.1 ng/mL (ref >5.9)

## 2023-11-13 LAB — TSH: TSH: 1.21 u[IU]/mL (ref 0.35–5.50)

## 2023-11-13 LAB — VITAMIN B12: Vitamin B-12: 1383 pg/mL — ABNORMAL HIGH (ref 211–911)

## 2023-11-13 LAB — IBC + FERRITIN
Ferritin: 40.2 ng/mL (ref 10.0–291.0)
Iron: 53 ug/dL (ref 42–145)
Saturation Ratios: 16.1 % — ABNORMAL LOW (ref 20.0–50.0)
TIBC: 329 ug/dL (ref 250.0–450.0)
Transferrin: 235 mg/dL (ref 212.0–360.0)

## 2023-11-13 MED ORDER — PANTOPRAZOLE SODIUM 40 MG PO TBEC: 40.0000 mg | DELAYED_RELEASE_TABLET | Freq: Two times a day (BID) | ORAL | 3 refills | Status: DC

## 2023-11-13 MED ORDER — HYDROCORTISONE (PERIANAL) 2.5 % EX CREA: TOPICAL_CREAM | CUTANEOUS | 0 refills | Status: AC

## 2023-11-13 NOTE — Progress Notes (Signed)
 Maureen Swanson [id number] 10/15/1965   Chief Complaint: GERD, dysphagia, abdominal pain, constipation  Referring Provider: Berneda Bridges, MD Primary GI MD: Para Bold  HPI: Maureen Swanson is a 58 y.o. female with past medical history of morbid obesity, CHF (LVEF 60 to 65% on Echo 2023), GERD, history of gastric ulcer, HTN, HLD, T2DM, severe OSA on CPAP, asthma, CVA 1995, TMJ disorder, diabetes who presents today for a complaint of acid reflux, dysphagia, abdominal pain, constipation.    Prior treatment for H. pylori per note from Dr. Honey Lusty 08/14/2009 [facility]).  Recently evaluated by cardiology 08/18/2023 for shortness of breath and chest pain.  Reported sharp pain in upper left chest into her shoulder for 2 weeks.  Pain worsens with rubbing the area and moving her left shoulder.  Does not occur with exertion.  Has been tolerating stationary bike 3 times a week.  Patient reported problems with asthma recently, some of her inhalers have not been covered by insurance.  Also had not been using her CPAP.  Her precordial pain and shortness of breath were thought not to be cardiac in nature. EKG was unremarkable. She is to follow-up as needed.  Today patient states she has been having swallowing problems which have been ongoing for years but have recently gotten worse.  About 4-5 times a month food gets stuck in her throat.  Has the most trouble with pasta, bread, meat.  After swallowing she can have epigastric pain which feels like tightness.  Often has to follow food with liquid to get it to go down.  Occasionally can have difficulty swallowing carbonated beverages, no trouble swallowing plain water .  She denies coughing or choking on food.  She also reports acid reflux and occasional burning chest pain which has not improved on omeprazole  (she has been taking this for a year or more, does not feel like it works for her).  She is unsure if she has tried Protonix  in the past.  She often feels nauseated  in the morning.  Will sometimes regurgitate undigested food.  Can have occasional vomiting as well.  Notices dark mucus in emesis but no blood or coffee ground material.  She also reports constipation ongoing for the last 2 years.  Has a bowel movement about once a week.  She has been taking Colace but does not think it has been helping very much.  Has tried MiraLAX  in the past but only on an as-needed basis, never took it daily.  When she strains with a bowel movement she can have a flareup of hemorrhoids, may see a spot of blood on the toilet paper but denies any blood in the stool or in the toilet.  Last saw blood about a week ago.  She has been having lower abdominal pain for the last 3 to 4 months.  Pain can be severe enough to wake her up at night.  States it feels like a heavy, dull pain.  Denies recent abdominal imaging.  Pain does not improve with a bowel movement.  Has been having some gas and bloating as well.  She is interested in having a repeat colonoscopy.  Overdue for screening.  She is still having chest pain intermittently.  States it is over the left side of her chest, happens at random and last 4-5 seconds.  Can hurt when she is laying down, applying pressure to the area helps.  She also continues to have some shortness of breath with exertion.  States she is using  her inhalers.  Had a stroke in 1995, no events since then.  Denies MI history.  She is not on any blood thinners.  She has sleep apnea but is not currently using CPAP.  Has used one before.  States she is planning on a repeat sleep study and hopefully will get another CPAP.  Denies family history of colon or stomach cancer.  Her mother had cancer but she is unsure what kind.  She has had mild normocytic anemia on labs over the last year, most recently with hemoglobin 11.7 on 09/20/2023  Previous GI Procedures/Imaging   EGD 08/22/2009 (Dr. 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 x 12 yrs.   Sleep apnea    Stroke (HCC)    1995   Mild. no residual problems   Substance abuse (HCC)    crack cocaine:  extensive treatment with [facility] [place] from 2014 to 2018 for this and alcohol  abuse.   TMJ (temporomandibular joint disorder)     Past Surgical History:  Procedure Laterality Date   BREAST EXCISIONAL BIOPSY Right    BREAST LUMPECTOMY WITH NEEDLE LOCALIZATION  04/19/2012   Procedure: BREAST LUMPECTOMY WITH NEEDLE LOCALIZATION;  Surgeon: Mayme Spearman, MD;  Location: [facility];  Service: General;  Laterality: Right;   FOOT SURGERY Left 2022   [facility]   KNEE ARTHROPLASTY Left 02/19/2022   Procedure: COMPUTER ASSISTED TOTAL KNEE ARTHROPLASTY;  Surgeon: Adonica Hoose, MD;  Location: [facility];  Service: Orthopedics;  Laterality: Left;   KNEE ARTHROPLASTY Right 12/11/2022   Procedure: RIGHT COMPUTER ASSISTED TOTAL KNEE ARTHROPLASTY;  Surgeon: Adonica Hoose, MD;  Location: [facility];  Service: Orthopedics;  Laterality: Right;   KNEE ARTHROSCOPY W/ PARTIAL MEDIAL MENISCECTOMY  05/01/2010   right    Current Outpatient Medications  Medication Sig Dispense Refill   Accu-Chek Softclix Lancets lancets CHECK BLOOD SUGAR TWICE A DAY 200 each 3   ADVAIR  DISKUS 100-50 MCG/ACT AEPB INHALE 1 PUFF BY MOUTH EVERY 12 HOURS AND RINSE MOUTH AFTER USE 60 each 8   albuterol  (VENTOLIN  HFA) 108 (90 Base) MCG/ACT inhaler  Inhale 1-2 puffs into the lungs every 6 (six) hours as needed for wheezing or shortness of breath. 18 g 1   amLODipine  (NORVASC ) 10 MG tablet Take 1 tablet (10 mg total) by mouth daily. 1 tab by mouth daily 30 tablet 11   atenolol  (TENORMIN ) 100 MG tablet TAKE ONE TABLET BY MOUTH DAILY 30 tablet 6   atorvastatin  (LIPITOR) 40 MG tablet Take 1 tablet (40 mg total) by mouth at bedtime. 30 tablet 11   azelastine  (ASTELIN ) 0.1 % nasal spray Place 2 sprays into both nostrils 2 (two) times daily as needed. Use in each nostril as directed 30 mL 5   budesonide -formoterol  (  SYMBICORT ) 160-4.5 MCG/ACT inhaler Inhale 2 puffs into the lungs in the morning and at bedtime. 1 each 5   cyclobenzaprine  (FLEXERIL ) 5 MG tablet TAKE ONE TABLET BY MOUTH AT BEDTIME AS NEEDED 30 tablet 8   diclofenac  Sodium (VOLTAREN ) 1 % GEL APPLY TWO GRAMS TO THE KNEES UP TO FOUR TIMES A DAY AS NEEDED FOR PAIN 100 g 6   empagliflozin  (JARDIANCE ) 10 MG TABS tablet Take 1 tablet (10 mg total) by mouth daily before breakfast. 30 tablet 11   fluticasone  (FLONASE ) 50 MCG/ACT nasal spray Place 2 sprays into both nostrils daily. 16 g 5   gabapentin  (NEURONTIN ) 300 MG capsule Take 300 mg by mouth 3 (three) times daily.     glucose blood (ACCU-CHEK GUIDE TEST) test strip CHECK BLOOD SUGAR TWICE A DAY 200 each 3   hydrochlorothiazide  (HYDRODIURIL ) 25 MG tablet TAKE ONE TABLET BY MOUTH DAILY IN THE MORNING WITH BREAKFAST 30 tablet 4   lamoTRIgine  (LAMICTAL ) 100 MG tablet Take 100 mg by mouth daily.     levocetirizine (XYZAL ) 5 MG tablet Take 1 tablet (5 mg total) by mouth every evening. 30 tablet 5   lidocaine  (LIDODERM ) 5 % Place 1 patch onto the skin daily. Remove & Discard patch within 12 hours or as directed by MD 30 patch 0   losartan  (COZAAR ) 100 MG tablet TAKE ONE TABLET BY MOUTH DAILY IN THE MORNING 30 tablet 4   melatonin 3 MG TABS tablet Take 1 tablet (3 mg total) by mouth at bedtime. 30 tablet 0   meloxicam  (MOBIC ) 15 MG tablet Take 1  tablet (15 mg total) by mouth daily. 21 tablet 0   metFORMIN  (GLUCOPHAGE ) 1000 MG tablet TAKE ONE TABLET BY MOUTH TWICE A DAY WITH MEAL 60 tablet 6   methocarbamol  (ROBAXIN ) 500 MG tablet Take 1 tablet (500 mg total) by mouth 2 (two) times daily. 20 tablet 0   omeprazole  (PRILOSEC) 40 MG capsule TAKE ONE CAPSULE BY MOUTH DAILY IN THE MORNING HALF HOUR BEFORE BREAKFAST AND OTHER MEDS 30 capsule 4   ondansetron  (ZOFRAN -ODT) 4 MG disintegrating tablet Take 1 tablet (4 mg total) by mouth every 6 (six) hours as needed for nausea or vomiting. 20 tablet 0   prazosin  (MINIPRESS ) 1 MG capsule Take 1 mg by mouth at bedtime.     Tiotropium Bromide Monohydrate  (SPIRIVA  RESPIMAT) 1.25 MCG/ACT AERS Inhale 2 puffs into the lungs daily. 4 g 5   trazodone  (DESYREL ) 300 MG tablet Take 300 mg by mouth at bedtime.     valbenazine  (INGREZZA ) 80 MG capsule Take 80 mg by mouth at bedtime.     No current facility-administered medications for this visit.    Allergies as of 11/13/2023 - Review Complete 09/20/2023  Allergen Reaction Noted   Chocolate Hives 07/07/2011   Penicillins Hives and Other (See Comments)    Orange Hives 07/07/2011   Strawberry extract Swelling 04/07/2019   Tomato Hives and Other (See Comments) 09/07/2012    Family History  Problem Relation Age of Onset   Diabetes Mother    Breast cancer Mother        She is not sure about this diagnosis   Cancer Father        Laryngeal   Multiple sclerosis Sister        not clear if this is her diagnosis   Bipolar disorder Maternal Aunt    Tremor Maternal Aunt    Alcoholism Maternal Uncle    Schizophrenia Maternal Grandfather  Social History   Tobacco Use   Smoking status: Former    Current packs/day: 0.00    Average packs/day: 1 pack/day for 37.0 years (37.0 ttl pk-yrs)    Types: Cigarettes    Start date: 12/15/1983    Quit date: 12/14/2020    Years since quitting: 2.9    Passive exposure: Past   Smokeless tobacco: Never  Vaping Use    Vaping status: Never Used  Substance Use Topics   Alcohol  use: Not Currently    Comment: History of abuse.  Clean for 9 months dated 12/31/2021   Drug use: Not Currently    Types: Cocaine, "Crack" cocaine    Comment: Clean for 9 months dated 12/31/2021     Review of Systems:    Constitutional: No weight loss, fever, chills, weakness or fatigue Eyes: No change in vision Ears, Nose, Throat:  No change in hearing or congestion Skin: No rash or itching Cardiovascular: No palpitations. Positive chest pain.  Respiratory: No cough. Positive shortness of breath. Gastrointestinal: See HPI and otherwise negative Genitourinary: No dysuria or change in urinary frequency Neurological: No headache, dizziness or syncope Musculoskeletal: No new muscle or joint pain Hematologic: No bruising    Physical Exam:  Vital signs: BP 114/68   Pulse 72   Ht 5\' 4"  (1.626 m)   Wt 231 lb (104.8 kg)   BMI 39.65 kg/m    Constitutional: NAD, Well developed, Well nourished, alert and cooperative Head:  Normocephalic and atraumatic.  Eyes: No scleral icterus. Conjunctiva pink. Respiratory: Respirations even and unlabored. Lungs clear to auscultation bilaterally.  No wheezes, crackles, or rhonchi.  Cardiovascular:  Regular rate and rhythm. No murmurs. No peripheral edema. Gastrointestinal:  Soft, distended, generalized tenderness to palpation. No rebound or guarding. Normal bowel sounds. No appreciable masses or hepatomegaly. Rectal:  Perianal skin tags, no visible bleeding. Stool brown, heme negative. Neurologic:  Alert and oriented x4;  grossly normal neurologically.  Skin:   Dry and intact without significant lesions or rashes. Psychiatric: Oriented to person, place and time. Demonstrates good judgement and reason without abnormal affect or behaviors.   RELEVANT LABS AND IMAGING: CBC    Component Value Date/Time   WBC 6.6 09/20/2023 1239   RBC 4.19 09/20/2023 1239   HGB 11.7 (L) 09/20/2023 1239   HGB  12.8 11/23/2019 1250   HCT 37.9 09/20/2023 1239   HCT 38.9 11/23/2019 1250   PLT 219 09/20/2023 1239   PLT 230 11/23/2019 1250   MCV 90.5 09/20/2023 1239   MCV 89 11/23/2019 1250   MCH 27.9 09/20/2023 1239   MCHC 30.9 09/20/2023 1239   RDW 12.1 09/20/2023 1239   RDW 11.1 (L) 11/23/2019 1250   LYMPHSABS 1.6 05/01/2023 1358   LYMPHSABS 1.5 11/23/2019 1250   MONOABS 0.4 05/01/2023 1358   EOSABS 0.1 05/01/2023 1358   EOSABS 0.1 11/23/2019 1250   BASOSABS 0.0 05/01/2023 1358   BASOSABS 0.0 11/23/2019 1250    CMP     Component Value Date/Time   NA 134 (L) 09/20/2023 1239   NA 140 01/13/2023 1120   K 3.6 09/20/2023 1239   CL 101 09/20/2023 1239   CO2 19 (L) 09/20/2023 1239   GLUCOSE 354 (H) 09/20/2023 1239   BUN 18 09/20/2023 1239   BUN 10 01/13/2023 1120   CREATININE 1.24 (H) 09/20/2023 1239   CALCIUM  9.5 09/20/2023 1239   PROT 7.0 09/20/2023 1239   PROT 7.5 01/13/2023 1120   ALBUMIN 3.7 09/20/2023 1239   ALBUMIN 4.4  01/13/2023 1120   AST 21 09/20/2023 1239   ALT 13 09/20/2023 1239   ALKPHOS 76 09/20/2023 1239   BILITOT 0.6 09/20/2023 1239   BILITOT 0.2 01/13/2023 1120   GFRNONAA 50 (L) 09/20/2023 1239   GFRAA 74 11/23/2019 1250   Echocardiogram 01/01/2022 1. Left ventricular ejection fraction, by estimation, is 60 to 65% . The left ventricle has normal function. The left ventricle has no regional wall motion abnormalities. There is moderate left ventricular hypertrophy. Left ventricular diastolic parameters are consistent with Grade I diastolic dysfunction ( impaired relaxation) .  2. Right ventricular systolic function is normal. The right ventricular size is normal. Tricuspid regurgitation signal is inadequate for assessing PA pressure.  3. The mitral valve is normal in structure. No evidence of mitral valve regurgitation.  4. The aortic valve is tricuspid. Aortic valve regurgitation is not visualized.  5. The inferior vena cava is normal in size with greater than 50%  respiratory variability, suggesting right atrial pressure of 3 mmHg.  Assessment/Plan:   GERD Nausea, vomiting Dysphagia  Normocytic anemia  Patient with longstanding history of dysphagia, with recent worsening of symptoms.  Food getting stuck in her esophagus about once a week, worse with meat, pasta, bread. Has not had an EGD since 2011.  Has been having persistent acid reflux, nausea, regurgitation, occasional vomiting despite omeprazole  40 mg daily.  - Will plan for EGD. Will discuss with team regarding location of procedure in light of patient's chest pain and shortness of breath. - Stop omeprazole  - Start Protonix  40mg  BID - Check labs today: CBC, CMP, TSH, TTG, IgA, iron, ferritin, vitamin B12, folate  Screening for colon cancer Constipation  Abdominal distention Abdominal pain Hemorrhoids Patient with 2 years of constipation, and 3-4 months of lower abdominal pain.  Has been taking Colace without improvement.  There is abdominal distention and tenderness on exam.  Also noted to have perianal skin tags with report of hemorrhoids.  Stool brown and heme-negative on exam today.  - Due for colonoscopy. Will need 2-day colon prep due to constipation.  Will discuss with team regarding location of procedure in light of patient's chest pain and shortness of breath. - Order CT A/P to evaluate abdominal pain and distention - Start Miralax  1 capful daily  - Will send topical hydrocortisone  cream 2.5% for patient to use sparingly as needed for hemorrhoids   Valiant Gaul, PA-C [facility] 11/13/2023, 8:18 AM  Patient Care Team: Berneda Bridges, MD as PCP - General (Family Medicine) Millicent Ally, MD as PCP - Cardiology (Cardiology)

## 2023-11-13 NOTE — Patient Instructions (Addendum)
 Your provider has requested that you go to the basement level for lab work before leaving today. Press "B" on the elevator. The lab is located at the first door on the left as you exit the elevator.  Stop taking omeprazole .   We have sent the following medications to your pharmacy for you to pick up at your convenience: pantoprazole , hydrocortisone  cream.  Please purchase the following medications over the counter and take as directed: Miralax  1 capful daily.  You have been scheduled for a CT scan of the abdomen and pelvis at [facility] 1st floor Radiology. You are scheduled on 12/01/23 at 8:30am, arriving at 8:15am.  Please follow the written instructions below on the day of your exam:   1) Do not eat anything after 4:30am (4 hours prior to your test)     You may take any medications as prescribed with a small amount of water , if necessary. If you take any of the following medications: METFORMIN , GLUCOPHAGE , GLUCOVANCE, AVANDAMET, RIOMET , FORTAMET , ACTOPLUS MET, JANUMET, GLUMETZA  or METAGLIP, you MAY be asked to HOLD this medication 48 hours AFTER the exam.   The purpose of you drinking the oral contrast is to aid in the visualization of your intestinal tract. The contrast solution may cause some diarrhea. Depending on your individual set of symptoms, you may also receive an intravenous injection of x-ray contrast/dye. Plan on being at [facility] for 45 minutes or longer, depending on the type of exam you are having performed.   If you have any questions regarding your exam or if you need to reschedule, you may call [facility] at [phone] between the hours of 8:00 am and 5:00 pm, Monday-Friday.    _______________________________________________________  If your blood pressure at your visit was 140/90 or greater, please contact your primary care physician to follow up on this.  _______________________________________________________  If you are age 29 or older,  your body mass index should be between 23-30. Your Body mass index is 39.65 kg/m. If this is out of the aforementioned range listed, please consider follow up with your Primary Care Provider.  If you are age 26 or younger, your body mass index should be between 19-25. Your Body mass index is 39.65 kg/m. If this is out of the aformentioned range listed, please consider follow up with your Primary Care Provider.   ________________________________________________________  The [facility] providers would like to encourage you to use MYCHART to communicate with providers for non-urgent requests or questions.  Due to long hold times on the telephone, sending your provider a message by [facility] may be a faster and more efficient way to get a response.  Please allow 48 business hours for a response.  Please remember that this is for non-urgent requests.  _______________________________________________________

## 2023-11-14 LAB — IGA: Immunoglobulin A: 240 mg/dL (ref 47–310)

## 2023-11-14 LAB — TISSUE TRANSGLUTAMINASE, IGA: (tTG) Ab, IgA: <1 U/mL

## 2023-11-16 ENCOUNTER — Ambulatory Visit: Payer: Self-pay | Admitting: Gastroenterology

## 2023-11-16 ENCOUNTER — Telehealth: Payer: Self-pay | Admitting: *Deleted

## 2023-11-16 ENCOUNTER — Telehealth: Payer: Self-pay

## 2023-11-16 DIAGNOSIS — R197 Diarrhea, unspecified: Secondary | ICD-10-CM

## 2023-11-16 NOTE — Telephone Encounter (Signed)
  [facility] Pre-operative Risk Assessment     Request for surgical clearance:     Endoscopy Procedure  What type of surgery is being performed?     EGD/Colon  When is this surgery scheduled?     TBD  What type of clearance is required ?   Medical  Are there any medications that need to be held prior to surgery and how long? none  Practice name and name of physician performing surgery?      [facility]  What is your office phone and fax number?      Phone- [phone]  Fax- [phone]  Anesthesia type (None, local, MAC, general) ?       MAC   Please route your response to Darrol Emmer, CMA

## 2023-11-16 NOTE — Telephone Encounter (Signed)
   Name: Maureen Swanson  DOB: 02-07-1966  MRN: [id number]  Primary Cardiologist: Magnus Schuller, MD  Preoperative team, please contact this patient and set up a phone call appointment for further preoperative risk assessment. Please obtain consent and complete medication review. Thank you for your help.  I confirm that guidance regarding antiplatelet and oral anticoagulation therapy has been completed and, if necessary, noted below. None requested.   I also confirmed the patient resides in the state of [place] . As per [facility] Medical Board telemedicine laws, the patient must reside in the state in which the provider is licensed.   Dilara Navarrete D Andrey Mccaskill, NP 11/16/2023, 4:14 PM  [facility]

## 2023-11-16 NOTE — Telephone Encounter (Signed)
 Pt has been scheduled tele preop appt 11/25/23 as she states GI office will not schedule until she has been cleared. Med rec and consent are done.

## 2023-11-16 NOTE — Telephone Encounter (Signed)
-----   Message from Sharmaine Dearth sent at 11/16/2023  1:37 PM EDT ----- Regarding: Cardic clearance Mylinda Asa,  Please request cardiac clearance for this patient we saw last week with shortness of breath and chest pain, for EGD/colonoscopy.  Thanks, Abraham Hoffmann

## 2023-11-16 NOTE — Telephone Encounter (Signed)
 Pt has been scheduled tele preop appt 11/25/23 as she states GI office will not schedule until she has been cleared. Med rec and consent are done.      Patient Consent for Virtual Visit        Maureen Swanson has provided verbal consent on 11/16/2023 for a virtual visit (video or telephone).   CONSENT FOR VIRTUAL VISIT FOR:  Maureen Swanson  By participating in this virtual visit I agree to the following:  I hereby voluntarily request, consent and authorize [facility] and its employed or contracted physicians, physician assistants, nurse practitioners or other licensed health care professionals (the Practitioner), to provide me with telemedicine health care services (the "Services") as deemed necessary by the treating Practitioner. I acknowledge and consent to receive the Services by the Practitioner via telemedicine. I understand that the telemedicine visit will involve communicating with the Practitioner through live audiovisual communication technology and the disclosure of certain medical information by electronic transmission. I acknowledge that I have been given the opportunity to request an in-person assessment or other available alternative prior to the telemedicine visit and am voluntarily participating in the telemedicine visit.  I understand that I have the right to withhold or withdraw my consent to the use of telemedicine in the course of my care at any time, without affecting my right to future care or treatment, and that the Practitioner or I may terminate the telemedicine visit at any time. I understand that I have the right to inspect all information obtained and/or recorded in the course of the telemedicine visit and may receive copies of available information for a reasonable fee.  I understand that some of the potential risks of receiving the Services via telemedicine include:  Delay or interruption in medical evaluation due to technological equipment failure or  disruption; Information transmitted may not be sufficient (e.g. poor resolution of images) to allow for appropriate medical decision making by the Practitioner; and/or  In rare instances, security protocols could fail, causing a breach of personal health information.  Furthermore, I acknowledge that it is my responsibility to provide information about my medical history, conditions and care that is complete and accurate to the best of my ability. I acknowledge that Practitioner's advice, recommendations, and/or decision may be based on factors not within their control, such as incomplete or inaccurate data provided by me or distortions of diagnostic images or specimens that may result from electronic transmissions. I understand that the practice of medicine is not an exact science and that Practitioner makes no warranties or guarantees regarding treatment outcomes. I acknowledge that a copy of this consent can be made available to me via my patient portal [facility] MyChart), or I can request a printed copy by calling the office of [facility].    I understand that my insurance will be billed for this visit.   I have read or had this consent read to me. I understand the contents of this consent, which adequately explains the benefits and risks of the Services being provided via telemedicine.  I have been provided ample opportunity to ask questions regarding this consent and the Services and have had my questions answered to my satisfaction. I give my informed consent for the services to be provided through the use of telemedicine in my medical care

## 2023-11-17 ENCOUNTER — Ambulatory Visit: Payer: MEDICAID

## 2023-11-19 ENCOUNTER — Telehealth: Payer: Self-pay

## 2023-11-19 DIAGNOSIS — Z1211 Encounter for screening for malignant neoplasm of colon: Secondary | ICD-10-CM

## 2023-11-19 DIAGNOSIS — R1319 Other dysphagia: Secondary | ICD-10-CM

## 2023-11-19 DIAGNOSIS — K219 Gastro-esophageal reflux disease without esophagitis: Secondary | ICD-10-CM

## 2023-11-19 DIAGNOSIS — R112 Nausea with vomiting, unspecified: Secondary | ICD-10-CM

## 2023-11-19 MED ORDER — NA SULFATE-K SULFATE-MG SULF 17.5-3.13-1.6 GM/177ML PO SOLN
1.0000 | Freq: Once | ORAL | 0 refills | Status: AC
Start: 2023-11-19 — End: 2023-11-19

## 2023-11-19 NOTE — Telephone Encounter (Signed)
-----   Message from Sharmaine Dearth sent at 11/19/2023  1:00 PM EDT ----- Regarding: EGD/Colon Caroleen Churn spoken with Dr. Willy Harvest and Rogena Class regarding this patient. She still needs cardiac clearance but is okay for procedures in [facility]. Please schedule EGD w/ dilation and colonoscopy with Dr. Willy Harvest. She will need a 2-day prep due to her constipation. Thank you, Abraham Hoffmann

## 2023-11-19 NOTE — Telephone Encounter (Signed)
 Patient scheduled for EGD/Colon in our [facility] with Dr. Willy Harvest with 2 day suprep  for 12/22/23 at 1:00 pm. Informed patient that I will mail her instructions to her home address and for her to contact our office if she has any questions regarding the prep. Also, informed patient we are still waiting on cardiac clearance and verified she is aware of her tele visit with Cardiology on 11/25/23. Patient verbalized understanding.

## 2023-11-25 ENCOUNTER — Ambulatory Visit: Payer: MEDICAID | Attending: Cardiovascular Disease | Admitting: Nurse Practitioner

## 2023-11-25 DIAGNOSIS — Z01810 Encounter for preprocedural cardiovascular examination: Secondary | ICD-10-CM

## 2023-11-25 NOTE — Telephone Encounter (Signed)
 Due to concerning symptoms, I did not charge her for tele visit today. She will need an in-person office visit for clearance. I have forwarded request to our preop call back team to have patient scheduled to come in the office. Please also notify requesting provider.   Gerldine Koch, NP-C  11/25/2023, 11:18 AM [address], [address] [place], [place] [address] Office [phone] Fax [phone]

## 2023-11-25 NOTE — Telephone Encounter (Signed)
 Pt has been scheduled to see Marlana Silvan, NP 12/04/23. See notes from preop APP Slater Duncan, NP due to concerning symptoms she will need in office appt. Pt is agreeable to appt . Pt did tell me that her procedure was supposed to be 12/01/23. I stated the request came to our office as TBD. Pt said that is ok she will just move her procedure and see us  on 12/04/23. I will update all parties involved.

## 2023-11-25 NOTE — Progress Notes (Signed)
 Virtual Visit via Telephone Note   Because of GWENDALYNN ECKSTROM co-morbid illnesses, she is at least at moderate risk for complications without adequate follow up.  This format is felt to be most appropriate for this patient at this time.  Due to technical limitations with video connection (technology), today's appointment will be conducted as an audio only telehealth visit, and EULENE PEKAR verbally agreed to proceed in this manner.   All issues noted in this document were discussed and addressed.  No physical exam could be performed with this format.  Evaluation Performed:  Preoperative cardiovascular risk assessment _____________   Date:  11/25/2023   Patient ID:  Maureen Swanson, DOB [birth year], MRN [id number] Patient Location:  Home Provider location:   Office  Primary Care Provider:  Berneda Bridges, MD Primary Cardiologist:  Magnus Schuller, MD  Chief Complaint / Patient Profile   58 y.o. y/o female with a h/o coronary calcium  score of 0, OSA on CPAP, morbid obesity, HTN, diabetes, hyperlipidemia, asthma, GERD who is pending EGD/colonoscopy with [facility] on date TBD and presents today for telephonic preoperative cardiovascular risk assessment.  History of Present Illness    AMBRIE CARTE is a 58 y.o. female who presents via audio/video conferencing for a telehealth visit today.  Pt was last seen in cardiology clinic on 08/18/23 by Katlyn West, NP.  At that time PATRESE Carlton was doing well.  The patient is now pending procedure as outlined above. Since her last visit, she reports increased shortness of breath and difficulty laying flat. She has some ankle edema as well. Due to concerning symptoms, she was scheduled for an in-person office visit for preop clearance.   Past Medical History    Past Medical History:  Diagnosis Date   Anxiety    Arthritis    back and knees   Asthma    daily and prn inhalers   Atypical ductal hyperplasia of breast 03/2012   right   Bipolar 1 disorder (HCC)     CHF (congestive heart failure) (HCC)    Depression    Diabetes mellitus    diet-controlled   Gastric ulcer    GERD (gastroesophageal reflux disease)    Gout    Headache(784.0)    migraines   High cholesterol    Hypertension    under control, has been on med. x 12 yrs.   Sleep apnea    Stroke (HCC)    1995   Mild. no residual problems   Substance abuse (HCC)    crack cocaine:  extensive treatment with [facility] [place] from 2014 to 2018 for this and alcohol  abuse.   TMJ (temporomandibular joint disorder)    Past Surgical History:  Procedure Laterality Date   BREAST EXCISIONAL BIOPSY Right    BREAST LUMPECTOMY WITH NEEDLE LOCALIZATION  04/19/2012   Procedure: BREAST LUMPECTOMY WITH NEEDLE LOCALIZATION;  Surgeon: Mayme Spearman, MD;  Location: [facility];  Service: General;  Laterality: Right;   FOOT SURGERY Left 2022   [facility]   KNEE ARTHROPLASTY Left 02/19/2022   Procedure: COMPUTER ASSISTED TOTAL KNEE ARTHROPLASTY;  Surgeon: Adonica Hoose, MD;  Location: [facility];  Service: Orthopedics;  Laterality: Left;   KNEE ARTHROPLASTY Right 12/11/2022   Procedure: RIGHT COMPUTER ASSISTED TOTAL KNEE ARTHROPLASTY;  Surgeon: Adonica Hoose, MD;  Location: [facility];  Service: Orthopedics;  Laterality: Right;   KNEE ARTHROSCOPY W/ PARTIAL MEDIAL MENISCECTOMY  05/01/2010  right    Allergies  Allergies  Allergen Reactions   Chocolate Hives   Penicillins Hives and Other (See Comments)        Orange Hives   Strawberry Extract Swelling   Tomato Hives and Other (See Comments)    acid foods    Home Medications    Prior to Admission medications   Medication Sig Start Date End Date Taking? Authorizing Provider  Accu-Chek Softclix Lancets lancets CHECK BLOOD SUGAR TWICE A DAY 08/11/23   Ronalee Cocking, MD  ADVAIR  DISKUS 100-50 MCG/ACT AEPB INHALE 1 PUFF BY MOUTH EVERY 12 HOURS AND RINSE MOUTH AFTER USE 12/19/22   Ronalee Cocking, MD  albuterol  (VENTOLIN  HFA) 108 [phone] Base) MCG/ACT inhaler Inhale 1-2 puffs into the lungs every 6 (six) hours as needed for wheezing or shortness of breath. 07/24/23   Kandice Orleans, MD  amLODipine  (NORVASC ) 10 MG tablet Take 1 tablet (10 mg total) by mouth daily. 1 tab by mouth daily 10/13/22   Ronalee Cocking, MD  atenolol  (TENORMIN ) 100 MG tablet TAKE ONE TABLET BY MOUTH DAILY 04/06/23   Ronalee Cocking, MD  atorvastatin  (LIPITOR) 40 MG tablet Take 1 tablet (40 mg total) by mouth at bedtime. 10/13/22   Ronalee Cocking, MD  azelastine  (ASTELIN ) 0.1 % nasal spray Place 2 sprays into both nostrils 2 (two) times daily as needed. Use in each nostril as directed 07/31/23   Kandice Orleans, MD  budesonide -formoterol  (SYMBICORT ) 160-4.5 MCG/ACT inhaler Inhale 2 puffs into the lungs in the morning and at bedtime. 08/18/23   Kandice Orleans, MD  cyclobenzaprine  (FLEXERIL ) 5 MG tablet TAKE ONE TABLET BY MOUTH AT BEDTIME AS NEEDED 12/19/22   Ronalee Cocking, MD  diclofenac  Sodium (VOLTAREN ) 1 % GEL APPLY TWO GRAMS TO THE KNEES UP TO FOUR TIMES A DAY AS NEEDED FOR PAIN 05/04/23   Ronalee Cocking, MD  empagliflozin  (JARDIANCE ) 10 MG TABS tablet Take 1 tablet (10 mg total) by mouth daily before breakfast. 10/13/22   Ronalee Cocking, MD  fluticasone  (FLONASE ) 50 MCG/ACT nasal spray Place 2 sprays into both nostrils daily. 07/31/23   Kandice Orleans, MD  gabapentin  (NEURONTIN ) 300 MG capsule Take 300 mg by mouth 3 (three) times daily.    [provider]  glucose blood (ACCU-CHEK GUIDE TEST) test strip CHECK BLOOD SUGAR TWICE A DAY 08/11/23   Ronalee Cocking, MD  hydrochlorothiazide  (HYDRODIURIL ) 25 MG tablet TAKE ONE TABLET BY MOUTH DAILY IN THE MORNING WITH BREAKFAST 07/03/23   Ronalee Cocking, MD  hydrocortisone  (ANUSOL -HC) 2.5 % rectal cream Apply sparingly to rectum up to 5-7 days for hemorrhoids 11/13/23   Heinz, Sara E, PA-C  insulin  glargine (LANTUS  SOLOSTAR) 100 UNIT/ML  Solostar Pen Inject 16 Units into the skin daily. 10/05/23   [provider]  lamoTRIgine  (LAMICTAL ) 100 MG tablet Take 100 mg by mouth daily. 05/14/21   [provider]  levocetirizine (XYZAL ) 5 MG tablet Take 1 tablet (5 mg total) by mouth every evening. 07/31/23   Kandice Orleans, MD  lidocaine  (LIDODERM ) 5 % Place 1 patch onto the skin daily. Remove & Discard patch within 12 hours or as directed by MD 09/20/23   Henderly, Britni A, PA-C  losartan  (COZAAR ) 100 MG tablet TAKE ONE TABLET BY MOUTH DAILY IN THE MORNING 07/03/23   Ronalee Cocking, MD  melatonin 3 MG TABS tablet Take 1 tablet (3 mg total) by mouth at bedtime. 01/09/21   Sharol Decamp, NP  meloxicam  (MOBIC ) 15 MG tablet  Take 1 tablet (15 mg total) by mouth daily. 08/28/23   Reina Cara, DPM  metFORMIN  (GLUCOPHAGE ) 1000 MG tablet TAKE ONE TABLET BY MOUTH TWICE A DAY WITH MEAL 04/06/23   Ronalee Cocking, MD  methocarbamol  (ROBAXIN ) 500 MG tablet Take 1 tablet (500 mg total) by mouth 2 (two) times daily. 09/20/23   Henderly, Britni A, PA-C  ondansetron  (ZOFRAN -ODT) 4 MG disintegrating tablet Take 1 tablet (4 mg total) by mouth every 6 (six) hours as needed for nausea or vomiting. 05/01/23   Rising, Ivette Marks, PA-C  pantoprazole  (PROTONIX ) 40 MG tablet Take 1 tablet (40 mg total) by mouth 2 (two) times daily before a meal. 11/13/23   Heinz, Adriane Albe, PA-C  prazosin  (MINIPRESS ) 1 MG capsule Take 1 mg by mouth at bedtime.    [provider]  Tiotropium Bromide Monohydrate  (SPIRIVA  RESPIMAT) 1.25 MCG/ACT AERS Inhale 2 puffs into the lungs daily. 08/18/23   Kandice Orleans, MD  trazodone  (DESYREL ) 300 MG tablet Take 300 mg by mouth at bedtime.    [provider]  valbenazine  (INGREZZA ) 80 MG capsule Take 80 mg by mouth at bedtime.    [provider]    Physical Exam    Vital Signs:  KEIANA TAVELLA does not have vital signs available for review today.  Given telephonic nature of communication,  physical exam is limited. AAOx3. NAD. Normal affect.  Speech and respirations are unlabored.  Accessory Clinical Findings    None  Assessment & Plan    1.  Preoperative Cardiovascular Risk Assessment: Patient initially scheduled for audio virtual visit, but due to new symptoms of edema, SOB and orthopnea, she was advised she will need to come into the office for evaluation.   No request to hold cardiac medications.  Gerldine Koch, NP-C  11/25/2023, 11:16 AM [address], [address] [place], [place] [address] Office [phone] Fax [phone]

## 2023-11-26 ENCOUNTER — Ambulatory Visit
Admission: RE | Admit: 2023-11-26 | Discharge: 2023-11-26 | Disposition: A | Discharge status: Home / Self Care | Payer: MEDICAID | Source: Ambulatory Visit | Attending: Family Medicine | Admitting: Family Medicine

## 2023-11-26 DIAGNOSIS — Z1231 Encounter for screening mammogram for malignant neoplasm of breast: Secondary | ICD-10-CM

## 2023-11-26 NOTE — Telephone Encounter (Signed)
 Patient's procedure has been moved to 12/22/23.  We will wait for clearance after her upcoming cardiology appt.

## 2023-12-01 ENCOUNTER — Ambulatory Visit (HOSPITAL_COMMUNITY)
Admission: RE | Admit: 2023-12-01 | Discharge: 2023-12-01 | Disposition: A | Discharge status: Home / Self Care | Payer: MEDICAID | Source: Ambulatory Visit | Attending: Gastroenterology | Admitting: Gastroenterology

## 2023-12-01 ENCOUNTER — Encounter (HOSPITAL_COMMUNITY): Payer: Self-pay

## 2023-12-01 DIAGNOSIS — K219 Gastro-esophageal reflux disease without esophagitis: Secondary | ICD-10-CM | POA: Insufficient documentation

## 2023-12-01 DIAGNOSIS — K5900 Constipation, unspecified: Secondary | ICD-10-CM | POA: Diagnosis present

## 2023-12-01 DIAGNOSIS — R140 Abdominal distension (gaseous): Secondary | ICD-10-CM | POA: Diagnosis present

## 2023-12-01 DIAGNOSIS — R1030 Lower abdominal pain, unspecified: Secondary | ICD-10-CM | POA: Diagnosis present

## 2023-12-01 DIAGNOSIS — K649 Unspecified hemorrhoids: Secondary | ICD-10-CM | POA: Diagnosis present

## 2023-12-01 DIAGNOSIS — R112 Nausea with vomiting, unspecified: Secondary | ICD-10-CM | POA: Insufficient documentation

## 2023-12-01 DIAGNOSIS — R1319 Other dysphagia: Secondary | ICD-10-CM | POA: Diagnosis present

## 2023-12-01 MED ORDER — IOHEXOL 300 MG/ML  SOLN
100.0000 mL | Freq: Once | INTRAMUSCULAR | Status: AC | PRN
  Administered 2023-12-01: 100 mL via INTRAVENOUS

## 2023-12-01 MED ORDER — SODIUM CHLORIDE (PF) 0.9 % IJ SOLN
INTRAMUSCULAR | Status: AC
  Filled 2023-12-01: qty 50

## 2023-12-03 NOTE — Progress Notes (Deleted)
 Assessment/Plan:   1.Parkinsonism             -I had a long counseling session with the patient today.  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  I explained that one clinically cannot tell the difference between idiopathic parkinsons disease and secondary parkinsonism from medication.  I also explained that even if one is able to get off of the medication, it can take up to 6 months to clinically definitively know if this is idiopathic parkinsons disease.  I did not advise that the patient go off of medication, as this needs to be discussed with the patients prescribing physician.  I did, however, tell the patient that the longer one is on the medication, the worse the symptoms can get.  The patient is to make an appointment with Dr. Marcelino to discuss what I have discussed with her.      Tardive dyskinesia, by history             -I did not see any evidence of tardive dyskinesia today.  It does not mean that she has not had this in the past, as she is currently on Ingrezza  and it could be covering up the symptoms.  However, patient states that she was placed on the Ingrezza  to help with the tremor and the symptoms that she presented with today.  Discussed with patient that secondary parkinsonism is not quite the same as tardive dyskinesia.  We discussed also that Ingrezza  can cause parkinsonism, although it is highly likely that all of her parkinsonism is from Abilify .   Subjective:   Maureen Swanson was seen today in follow up for parkinsonism.  When I saw her in mid January, we discussed that parkinsonism was likely due to Abilify  (given via injection).  I told her to make an appointment with Dr. Marcelino, her psychiatrist, to discuss her medication options.  I did, however, tell her that it could take 6 months to resolve the symptoms, even if medication was discontinued.  She called me the very next month and made the appointment for June because she  was still tremulous.  Unfortunately, she no showed that appt. Current prescribed movement disorder medications: ***   PREVIOUS MEDICATIONS: {Parkinson's RX:18200}  ALLERGIES:   Allergies  Allergen Reactions   Chocolate Hives   Penicillins Hives and Other (See Comments)        Orange Hives   Strawberry Extract Swelling   Tomato Hives and Other (See Comments)    acid foods    CURRENT MEDICATIONS:  No outpatient medications have been marked as taking for the 12/07/23 encounter (Appointment) with Merril Nagy, Asberry RAMAN, DO.     Objective:   PHYSICAL EXAMINATION:    VITALS:  There were no vitals filed for this visit.  GEN:  The patient appears stated age and is in NAD. HEENT:  Normocephalic, atraumatic.  The mucous membranes are moist. The superficial temporal arteries are without ropiness or tenderness. CV:  RRR Lungs:  CTAB Neck/HEME:  There are no carotid bruits bilaterally.  Neurological examination:  Orientation: The patient is alert and oriented x3. Cranial nerves: There is good facial symmetry with*** facial hypomimia. The speech is fluent and clear. Soft palate rises symmetrically and there is no tongue deviation. Hearing is intact to conversational tone. Sensation: Sensation is intact to light touch throughout Motor: Strength is at least antigravity x4.  Movement examination: Tone: There is nl tone in the bilateral  upper extremities.  The tone in the lower extremities is nl.  Abnormal movements: there is mild L>RUE rest tremor Coordination:  There is decremation with RAM's, only with LUE finger taps.  All other RAMs are normal, with any form of RAMS, including alternating supination and pronation of the forearm, hand opening and closing,  heel taps and toe taps bilaterally.  Gait and Station: The patient pushes off to arise. The patient is wide based and slightly unbalanced in the turn but she catches herself.  No shuffling.  Slight decreased arm swing on the L.    I  have reviewed and interpreted the following labs independently    Chemistry      Component Value Date/Time   NA 138 11/13/2023 1045   NA 140 01/13/2023 1120   K 4.1 11/13/2023 1045   CL 101 11/13/2023 1045   CO2 26 11/13/2023 1045   BUN 24 (H) 11/13/2023 1045   BUN 10 01/13/2023 1120   CREATININE 1.11 11/13/2023 1045      Component Value Date/Time   CALCIUM  9.8 11/13/2023 1045   ALKPHOS 95 11/13/2023 1045   AST 14 11/13/2023 1045   ALT 12 11/13/2023 1045   BILITOT 0.3 11/13/2023 1045   BILITOT 0.2 01/13/2023 1120       Lab Results  Component Value Date   WBC 7.9 11/13/2023   HGB 12.8 11/13/2023   HCT 39.8 11/13/2023   MCV 87.9 11/13/2023   PLT 227.0 11/13/2023    Lab Results  Component Value Date   TSH 1.21 11/13/2023     Total time spent on today's visit was ***30 minutes, including both face-to-face time and nonface-to-face time.  Time included that spent on review of records (prior notes available to me/labs/imaging if pertinent), discussing treatment and goals, answering patient's questions and coordinating care.  Cc:  Alec House, MD

## 2023-12-04 ENCOUNTER — Encounter: Payer: Self-pay | Admitting: Nurse Practitioner

## 2023-12-04 ENCOUNTER — Ambulatory Visit: Payer: MEDICAID | Attending: Nurse Practitioner | Admitting: Nurse Practitioner

## 2023-12-04 VITALS — BP 116/72 | HR 70 | Ht 64.0 in | Wt 235.8 lb

## 2023-12-04 DIAGNOSIS — Z01810 Encounter for preprocedural cardiovascular examination: Secondary | ICD-10-CM | POA: Diagnosis present

## 2023-12-04 DIAGNOSIS — E782 Mixed hyperlipidemia: Secondary | ICD-10-CM | POA: Diagnosis present

## 2023-12-04 DIAGNOSIS — R072 Precordial pain: Secondary | ICD-10-CM

## 2023-12-04 DIAGNOSIS — R0602 Shortness of breath: Secondary | ICD-10-CM

## 2023-12-04 DIAGNOSIS — E119 Type 2 diabetes mellitus without complications: Secondary | ICD-10-CM | POA: Diagnosis present

## 2023-12-04 DIAGNOSIS — I10 Essential (primary) hypertension: Secondary | ICD-10-CM | POA: Diagnosis not present

## 2023-12-04 DIAGNOSIS — Z8673 Personal history of transient ischemic attack (TIA), and cerebral infarction without residual deficits: Secondary | ICD-10-CM

## 2023-12-04 MED ORDER — METOPROLOL TARTRATE 100 MG PO TABS: 100.0000 mg | ORAL_TABLET | Freq: Once | ORAL | 0 refills | Status: AC

## 2023-12-04 NOTE — Progress Notes (Unsigned)
 Office Visit    Patient Name: Maureen Swanson Date of Encounter: 12/04/2023  Primary Care Provider:  Berneda Bridges, MD Primary Cardiologist:  Magnus Schuller, MD  Chief Complaint    58 year old female with a history of precordial chest pain (coronary calcium  score of 0 in 2023), hypertension, hyperlipidemia, CVA, type 2 diabetes, asthma, GERD, arthritis, obesity, OSA, bipolar disorder, and prior substance abuse who presents for follow-up related to chest pain and for preoperative cardiac evaluation.  Past Medical History    Past Medical History:  Diagnosis Date   Anxiety    Arthritis    back and knees   Asthma    daily and prn inhalers   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 x 12 yrs.   Sleep apnea    Stroke (HCC)    1995   Mild. no residual problems   Substance abuse (HCC)    crack cocaine:  extensive treatment with [facility] [place] from 2014 to 2018 for this and alcohol  abuse.   TMJ (temporomandibular joint disorder)    Past Surgical History:  Procedure Laterality Date   BREAST EXCISIONAL BIOPSY Right    BREAST LUMPECTOMY WITH NEEDLE LOCALIZATION  04/19/2012   Procedure: BREAST LUMPECTOMY WITH NEEDLE LOCALIZATION;  Surgeon: Mayme Spearman, MD;  Location: [facility];  Service: General;  Laterality: Right;   FOOT SURGERY Left 2022   [facility]   KNEE ARTHROPLASTY Left 02/19/2022   Procedure: COMPUTER ASSISTED TOTAL KNEE ARTHROPLASTY;  Surgeon: Adonica Hoose, MD;  Location: [facility];  Service: Orthopedics;  Laterality: Left;   KNEE ARTHROPLASTY Right 12/11/2022   Procedure: RIGHT COMPUTER ASSISTED TOTAL KNEE ARTHROPLASTY;   Surgeon: Adonica Hoose, MD;  Location: [facility];  Service: Orthopedics;  Laterality: Right;   KNEE ARTHROSCOPY W/ PARTIAL MEDIAL MENISCECTOMY  05/01/2010   right    Allergies  Allergies  Allergen Reactions   Chocolate Hives   Penicillins Hives and Other (See Comments)        Orange Hives   Strawberry Extract Swelling   Tomato Hives and Other (See Comments)    acid foods     Labs/Other Studies Reviewed    The following studies were reviewed today:  Cardiac Studies & Procedures   ______________________________________________________________________________________________   STRESS TESTS  NM MYOCAR MULTI W/SPECT W 10/31/2008  Narrative Clinical Data:  Unstable angina  MYOCARDIAL IMAGING WITH SPECT (REST AND PHARMACOLOGIC-STRESS) GATED LEFT VENTRICULAR WALL MOTION STUDY LEFT VENTRICULAR EJECTION FRACTION  Technique:  Standard myocardial SPECT imaging was performed after resting intravenous injection of 10 mCi Tc-19m tetrofosmin. Subsequently, intravenous infusion of elects to scan was performed under the supervision of the Cardiology staff.  At peak effect of the drug, 30 mCi Tc-24m tetrofosmin was injected intravenously and standard myocardial SPECT  imaging was performed.  Quantitative gated imaging was also performed to evaluate left ventricular wall motion, and estimate left ventricular ejection fraction.  Comparison: None  Findings:  Spect:  Breast attenuation artifact.  No perfusion defects.  Wall motion:  Normal wall motion.  Ejection fraction:  72% with an end diastolic volume of 68 ml and end-systolic volume of 19 ml.  IMPRESSION: No perfusion defects.  Provider: [place] Reece, Arlette Benders   ECHOCARDIOGRAM  ECHOCARDIOGRAM COMPLETE 01/01/2022  Narrative ECHOCARDIOGRAM REPORT    Patient Name:   Maureen Swanson  Date of Exam: 01/01/2022 Medical Rec #:  [id number]     Height:       63.5 in Accession #:    [id number]    Weight:       246.0  lb Date of Birth:  1966/01/01     BSA:          2.124 m Patient Age:    56 years      BP:           112/78 mmHg Patient Gender: F             HR:           64 bpm. Exam Location:  [address]  Procedure: 2D Echo, Cardiac Doppler and Color Doppler  Indications:    R06.02 SOB  History:        Patient has no prior history of Echocardiogram examinations. CHF, Stroke; Risk Factors:Hypertension, Diabetes, Dyslipidemia, Sleep Apnea and Former Smoker. Asthma.  Sonographer:    Mylinda Asa RCS Referring Phys: Millicent Ally  IMPRESSIONS   1. Left ventricular ejection fraction, by estimation, is 60 to 65%. The left ventricle has normal function. The left ventricle has no regional wall motion abnormalities. There is moderate left ventricular hypertrophy. Left ventricular diastolic parameters are consistent with Grade I diastolic dysfunction (impaired relaxation). 2. Right ventricular systolic function is normal. The right ventricular size is normal. Tricuspid regurgitation signal is inadequate for assessing PA pressure. 3. The mitral valve is normal in structure. No evidence of mitral valve regurgitation. 4. The aortic valve is tricuspid. Aortic valve regurgitation is not visualized. 5. The inferior vena cava is normal in size with greater than 50% respiratory variability, suggesting right atrial pressure of 3 mmHg.  FINDINGS Left Ventricle: Left ventricular ejection fraction, by estimation, is 60 to 65%. The left ventricle has normal function. The left ventricle has no regional wall motion abnormalities. The left ventricular internal cavity size was normal in size. There is moderate left ventricular hypertrophy. Left ventricular diastolic parameters are consistent with Grade I diastolic dysfunction (impaired relaxation).  Right Ventricle: The right ventricular size is normal. No increase in right ventricular wall thickness. Right ventricular systolic function is normal. Tricuspid  regurgitation signal is inadequate for assessing PA pressure.  Left Atrium: Left atrial size was normal in size.  Right Atrium: Right atrial size was normal in size.  Pericardium: There is no evidence of pericardial effusion.  Mitral Valve: The mitral valve is normal in structure. No evidence of mitral valve regurgitation.  Tricuspid Valve: The tricuspid valve is normal in structure. Tricuspid valve regurgitation is not demonstrated.  Aortic Valve: The aortic valve is tricuspid. Aortic valve regurgitation is not visualized.  Pulmonic Valve: The pulmonic valve was normal in structure. Pulmonic valve regurgitation is not visualized.  Aorta: The aortic root and ascending aorta are structurally normal, with no evidence of dilitation.  Venous: The inferior vena cava is normal in size with greater than 50% respiratory variability, suggesting right atrial pressure of 3 mmHg.  IAS/Shunts: No atrial level shunt detected by color flow Doppler.   LEFT VENTRICLE PLAX 2D LVIDd:         4.40 cm   Diastology LVIDs:  2.90 cm   LV e' medial:    8.49 cm/s LV PW:         1.20 cm   LV E/e' medial:  8.3 LV IVS:        1.40 cm   LV e' lateral:   9.46 cm/s LVOT diam:     2.00 cm   LV E/e' lateral: 7.4 LV SV:         76 LV SV Index:   36 LVOT Area:     3.14 cm   RIGHT VENTRICLE RV Basal diam:  3.00 cm RV S prime:     10.40 cm/s TAPSE (M-mode): 1.4 cm  LEFT ATRIUM             Index        RIGHT ATRIUM           Index LA diam:        3.80 cm 1.79 cm/m   RA Area:     13.30 cm LA Vol (A2C):   32.3 ml 15.21 ml/m  RA Volume:   32.50 ml  15.30 ml/m LA Vol (A4C):   34.4 ml 16.20 ml/m LA Biplane Vol: 34.8 ml 16.38 ml/m AORTIC VALVE LVOT Vmax:   130.00 cm/s LVOT Vmean:  87.800 cm/s LVOT VTI:    0.243 m  AORTA Ao Root diam: 3.10 cm Ao Asc diam:  3.30 cm  MITRAL VALVE MV Area (PHT): 3.56 cm    SHUNTS MV Decel Time: 213 msec    Systemic VTI:  0.24 m MV E velocity: 70.20 cm/s   Systemic Diam: 2.00 cm MV A velocity: 91.70 cm/s MV E/A ratio:  0.77  Photographer signed by Alois Arnt Signature Date/Time: 01/01/2022/1:41:55 PM    Final      CT SCANS  CT CORONARY MORPH W/CTA COR W/SCORE 01/02/2022  Addendum 01/05/2022 10:28 PM ADDENDUM REPORT: 01/05/2022 22:26  CLINICAL DATA:  Chest pain  EXAM: Cardiac/Coronary CTA  TECHNIQUE: A non-contrast, gated CT scan was obtained with axial slices of 3 mm through the heart for calcium  scoring. Calcium  scoring was performed using the Agatston method. A 120 kV prospective, gated, contrast cardiac scan was obtained. Gantry rotation speed was 250 msecs and collimation was 0.6 mm. Two sublingual nitroglycerin  tablets (0.8 mg) were given. The 3D data set was reconstructed in 5% intervals of the 35-75% of the R-R cycle. Diastolic phases were analyzed on a dedicated workstation using MPR, MIP, and VRT modes. The patient received 95 cc of contrast.  FINDINGS: Image quality: Excellent.  Noise artifact is: Limited.  Coronary Arteries:  Normal coronary origin.  Right dominance.  Left main: The left main is a large caliber vessel with a normal take off from the left coronary cusp that bifurcates to form a left anterior descending artery and a left circumflex artery. trifurcates into a LAD, LCX, and ramus intermedius. There is no plaque or stenosis.  Left anterior descending artery: The LAD is patent without evidence of plaque or stenosis. The LAD gives off 2 patent diagonal branches.  Ramus intermedius: Patent with no evidence of plaque or stenosis.  Left circumflex artery: The LCX is non-dominant and patent with no evidence of plaque or stenosis. The LCX gives off 2 patent obtuse marginal branches.  Right coronary artery: The RCA is dominant with normal take off from the right coronary cusp. There is no evidence of plaque or stenosis. The RCA terminates as a PDA and right posterolateral branch  without evidence of plaque  or stenosis.  Right Atrium: Right atrial size is within normal limits.  Right Ventricle: The right ventricular cavity is within normal limits.  Left Atrium: Left atrial size is normal in size with no left atrial appendage filling defect.  Left Ventricle: The ventricular cavity size is within normal limits. There are no stigmata of prior infarction. There is no abnormal filling defect.  Pulmonary arteries: Normal in size without proximal filling defect.  Pulmonary veins: Normal pulmonary venous drainage.  Pericardium: Normal thickness with no significant effusion or calcium  present.  Cardiac valves: The aortic valve is trileaflet without significant calcification. The mitral valve is normal structure without significant calcification.  Aorta: Normal caliber with no significant disease.  Extra-cardiac findings: See attached radiology report for non-cardiac structures.  IMPRESSION: 1. Coronary calcium  score of 0. This was 0 percentile for age-, sex, and race-matched controls.  2.  Normal coronary origin with right dominance.  3.  Normal coronary arteries.  CAD RADS 0.  4.  Consider non atherosclerotic causes of chest pain.  RECOMMENDATIONS: 1. CAD-RADS 0: No evidence of CAD (0%). Consider non-atherosclerotic causes of chest pain.  2. CAD-RADS 1: Minimal non-obstructive CAD (0-24%). Consider non-atherosclerotic causes of chest pain. Consider preventive therapy and risk factor modification.  3. CAD-RADS 2: Mild non-obstructive CAD (25-49%). Consider non-atherosclerotic causes of chest pain. Consider preventive therapy and risk factor modification.  4. CAD-RADS 3: Moderate stenosis. Consider symptom-guided anti-ischemic pharmacotherapy as well as risk factor modification per guideline directed care. Additional analysis with CT FFR will be submitted.  5. CAD-RADS 4: Severe stenosis. (70-99% or > 50% left main). Cardiac catheterization or  CT FFR is recommended. Consider symptom-guided anti-ischemic pharmacotherapy as well as risk factor modification per guideline directed care. Invasive coronary angiography recommended with revascularization per published guideline statements.  6. CAD-RADS 5: Total coronary occlusion (100%). Consider cardiac catheterization or viability assessment. Consider symptom-guided anti-ischemic pharmacotherapy as well as risk factor modification per guideline directed care.  7. CAD-RADS N: Non-diagnostic study. Obstructive CAD can't be excluded. Alternative evaluation is recommended.  Gaylyn Keas, MD   Electronically Signed By: Gaylyn Keas M.D. On: 01/05/2022 22:26  Narrative EXAM: OVER-READ INTERPRETATION  CT CHEST  The following report is a limited chest CT over-read performed by radiologist Dr. Janeece Mechanic of [facility] on 01/02/2022. This over-read does not include interpretation of cardiac or coronary anatomy or pathology. The coronary CTA interpretation by the cardiologist is attached.  COMPARISON:  08/03/2016  FINDINGS: Vascular: Heart is normal size.  Aorta normal caliber.  Mediastinum/Nodes: No adenopathy  Lungs/Pleura: No confluent opacity or effusion.  Upper Abdomen: No acute findings  Musculoskeletal: Chest wall soft tissues are unremarkable. No acute bony abnormality.  IMPRESSION: No acute or significant extracardiac abnormality.  Electronically Signed: By: Janeece Mechanic M.D. On: 01/02/2022 16:14     ______________________________________________________________________________________________     Recent Labs: 11/13/2023: ALT 12; BUN 24; Creatinine, Ser 1.11; Hemoglobin 12.8; Platelets 227.0; Potassium 4.1; Sodium 138; TSH 1.21  Recent Lipid Panel    Component Value Date/Time   CHOL 160 01/13/2023 1120   TRIG 188 (H) 01/13/2023 1120   HDL 58 01/13/2023 1120   CHOLHDL 3.1 05/29/2021 1632   VLDL 33 05/29/2021 1632   LDLCALC 71  01/13/2023 1120    History of Present Illness    58 year old female with the above past medical history including precordial chest pain (coronary calcium  score of 0 in 2023), hypertension, hyperlipidemia, CVA, type 2 diabetes, asthma, GERD, arthritis, obesity, OSA, bipolar disorder,  and prior substance abuse.  She was previously evaluated by Dr. Loetta Ringer in the setting of chest pain, shortness of breath.  Echocardiogram in 2023 showed EF 60 to 65%, no RWMA, moderate LVH, G1 DD, normal RV systolic function.  Coronary CTA in 12/2021 revealed coronary calcium  score of 0, normal coronary arteries.  She was last seen in the office on 08/18/2023 and reported intermittent sharp pain to her left chest, radiating to her shoulder, worse with palpation and with movement.  She denied exertional symptoms.  It was noted that her symptoms were likely noncardiac in nature.  She was advised to follow-up with her PCP should symptoms persist.  She was seen virtually on 11/25/2023 for preoperative cardiac evaluation and reported increased shortness of breath, orthopnea, mild ankle edema.  In person evaluation was advised.  She presents today for follow-up and for preoperative cardiac evaluation for upcoming EGD/colonoscopy with [facility].  Since her last visit  Wt Readings from Last 3 Encounters:  12/04/23 235 lb 12.8 oz (107 kg)  11/13/23 231 lb (104.8 kg)  09/20/23 235 lb (106.6 kg)   Stable overall from a cardiac standpoint.  She reports a 9-month history of nearly constant chest tightness both at rest and with activity, increased shortness of breath both at rest and with activity, mild orthopnea.  She has rare fleeting palpitations, mild lightheadedness with standing.  She denies dizziness, PND, weight gain.  She also reports a 1 month history of right upper quadrant pain.  She had a CT of the abdomen/pelvis on 12/01/2023 that was overall unremarkable.  She is following with GI.  Symptoms are overall atypical.  Chest pain  is largely positional.  She is not been wearing her CPAP machine, she reports that her PCP was going to set her up for repeat sleep study.  Given need for surgical clearance, will update coronary CT angiogram, echocardiogram.  Low suspicion for cardiac etiology.  Unable to complete ETT in the setting of arthritis, s/p bilateral knee replacements.  Will check CBC, CMET, BNP today.  Reviewed ED precautions.  She will establish with Dr. [name] Harries per patient request.  Follow-up in 6 to 8 weeks, sooner if needed.  Precordial chest pain/shortness of breath: Echocardiogram in 2023 showed EF 60 to 65%, no RWMA, moderate LVH, G1 DD, normal RV systolic function.  Coronary CTA in 12/2021 revealed coronary calcium  score of 0, normal coronary arteries.  Hypertension: BP well controlled. Continue current antihypertensive regimen.   Hyperlipidemia: LDL was 71 in 12/2022. History of CVA:  Type 2 diabetes: A1c was 7.2 in 12/2022. Preoperative cardiac exam: Disposition:   Home Medications    Current Outpatient Medications  Medication Sig Dispense Refill   Accu-Chek Softclix Lancets lancets CHECK BLOOD SUGAR TWICE A DAY 200 each 3   ADVAIR  DISKUS 100-50 MCG/ACT AEPB INHALE 1 PUFF BY MOUTH EVERY 12 HOURS AND RINSE MOUTH AFTER USE 60 each 8   albuterol  (VENTOLIN  HFA) 108 (90 Base) MCG/ACT inhaler Inhale 1-2 puffs into the lungs every 6 (six) hours as needed for wheezing or shortness of breath. 18 g 1   amLODipine  (NORVASC ) 10 MG tablet Take 1 tablet (10 mg total) by mouth daily. 1 tab by mouth daily 30 tablet 11   atenolol  (TENORMIN ) 100 MG tablet TAKE ONE TABLET BY MOUTH DAILY 30 tablet 6   atorvastatin  (LIPITOR) 40 MG tablet Take 1 tablet (40 mg total) by mouth at bedtime. 30 tablet 11   azelastine  (ASTELIN ) 0.1 % nasal spray Place 2 sprays  into both nostrils 2 (two) times daily as needed. Use in each nostril as directed 30 mL 5   budesonide -formoterol  (SYMBICORT ) 160-4.5 MCG/ACT inhaler Inhale 2 puffs into the lungs in  the morning and at bedtime. 1 each 5   cyclobenzaprine  (FLEXERIL ) 5 MG tablet TAKE ONE TABLET BY MOUTH AT BEDTIME AS NEEDED 30 tablet 8   diclofenac  Sodium (VOLTAREN ) 1 % GEL APPLY TWO GRAMS TO THE KNEES UP TO FOUR TIMES A DAY AS NEEDED FOR PAIN 100 g 6   empagliflozin  (JARDIANCE ) 10 MG TABS tablet Take 1 tablet (10 mg total) by mouth daily before breakfast. 30 tablet 11   fluticasone  (FLONASE ) 50 MCG/ACT nasal spray Place 2 sprays into both nostrils daily. 16 g 5   gabapentin  (NEURONTIN ) 300 MG capsule Take 300 mg by mouth 3 (three) times daily.     glucose blood (ACCU-CHEK GUIDE TEST) test strip CHECK BLOOD SUGAR TWICE A DAY 200 each 3   hydrochlorothiazide  (HYDRODIURIL ) 25 MG tablet TAKE ONE TABLET BY MOUTH DAILY IN THE MORNING WITH BREAKFAST 30 tablet 4   hydrocortisone  (ANUSOL -HC) 2.5 % rectal cream Apply sparingly to rectum up to 5-7 days for hemorrhoids 30 g 0   insulin  glargine (LANTUS  SOLOSTAR) 100 UNIT/ML Solostar Pen Inject 16 Units into the skin daily.     lamoTRIgine  (LAMICTAL ) 100 MG tablet Take 100 mg by mouth daily.     levocetirizine (XYZAL ) 5 MG tablet Take 1 tablet (5 mg total) by mouth every evening. 30 tablet 5   lidocaine  (LIDODERM ) 5 % Place 1 patch onto the skin daily. Remove & Discard patch within 12 hours or as directed by MD 30 patch 0   losartan  (COZAAR ) 100 MG tablet TAKE ONE TABLET BY MOUTH DAILY IN THE MORNING 30 tablet 4   melatonin 3 MG TABS tablet Take 1 tablet (3 mg total) by mouth at bedtime. 30 tablet 0   meloxicam  (MOBIC ) 15 MG tablet Take 1 tablet (15 mg total) by mouth daily. 21 tablet 0   metFORMIN  (GLUCOPHAGE ) 1000 MG tablet TAKE ONE TABLET BY MOUTH TWICE A DAY WITH MEAL 60 tablet 6   methocarbamol  (ROBAXIN ) 500 MG tablet Take 1 tablet (500 mg total) by mouth 2 (two) times daily. 20 tablet 0   ondansetron  (ZOFRAN -ODT) 4 MG disintegrating tablet Take 1 tablet (4 mg total) by mouth every 6 (six) hours as needed for nausea or vomiting. 20 tablet 0    pantoprazole  (PROTONIX ) 40 MG tablet Take 1 tablet (40 mg total) by mouth 2 (two) times daily before a meal. 60 tablet 3   prazosin  (MINIPRESS ) 1 MG capsule Take 1 mg by mouth at bedtime.     Tiotropium Bromide Monohydrate  (SPIRIVA  RESPIMAT) 1.25 MCG/ACT AERS Inhale 2 puffs into the lungs daily. 4 g 5   trazodone  (DESYREL ) 300 MG tablet Take 300 mg by mouth at bedtime.     valbenazine  (INGREZZA ) 80 MG capsule Take 80 mg by mouth at bedtime.     No current facility-administered medications for this visit.     Review of Systems    ***.  All other systems reviewed and are otherwise negative except as noted above.    Physical Exam    VS:  BP 116/72 (BP Location: Right Arm, Patient Position: Sitting, Cuff Size: Large)   Pulse 70   Ht 5' 4 (1.626 m)   Wt 235 lb 12.8 oz (107 kg)   SpO2 96%   BMI 40.47 kg/m  GEN: Well nourished, well developed,  in no acute distress. HEENT: normal. Neck: Supple, no JVD, carotid bruits, or masses. Cardiac: RRR, no murmurs, rubs, or gallops. No clubbing, cyanosis, edema.  Radials/DP/PT 2+ and equal bilaterally.  Respiratory:  Respirations regular and unlabored, clear to auscultation bilaterally. GI: Soft, nontender, nondistended, BS + x 4. MS: no deformity or atrophy. Skin: warm and dry, no rash. Neuro:  Strength and sensation are intact. Psych: Normal affect.  Accessory Clinical Findings    ECG personally reviewed by me today - EKG Interpretation Date/Time:  Friday December 04 2023 08:45:56 EDT Ventricular Rate:  70 PR Interval:  170 QRS Duration:  88 QT Interval:  414 QTC Calculation: 447 R Axis:   -6  Text Interpretation: Normal sinus rhythm Minimal voltage criteria for LVH, may be normal variant ( R in aVL ) Nonspecific T wave abnormality When compared with ECG of 20-Sep-2023 12:45, No significant change was found Confirmed by Marlana Silvan ([id number]) on 12/04/2023 9:07:06 AM  - no acute changes.   Lab Results  Component Value Date   WBC 7.9  11/13/2023   HGB 12.8 11/13/2023   HCT 39.8 11/13/2023   MCV 87.9 11/13/2023   PLT 227.0 11/13/2023   Lab Results  Component Value Date   CREATININE 1.11 11/13/2023   BUN 24 (H) 11/13/2023   NA 138 11/13/2023   K 4.1 11/13/2023   CL 101 11/13/2023   CO2 26 11/13/2023   Lab Results  Component Value Date   ALT 12 11/13/2023   AST 14 11/13/2023   ALKPHOS 95 11/13/2023   BILITOT 0.3 11/13/2023   Lab Results  Component Value Date   CHOL 160 01/13/2023   HDL 58 01/13/2023   LDLCALC 71 01/13/2023   TRIG 188 (H) 01/13/2023   CHOLHDL 3.1 05/29/2021    Lab Results  Component Value Date   HGBA1C 7.2 (H) 01/13/2023    Assessment & Plan    1.  ***      Jude Norton, NP 12/04/2023, 9:19 AM

## 2023-12-04 NOTE — Patient Instructions (Addendum)
 Medication Instructions:  No medication changes were made during today's visit.  *If you need a refill on your cardiac medications before your next appointment, please call your pharmacy*   Lab Work: Labs will be drawn today .....................  CBC, CMET, BNP If you have labs (blood work) drawn today and your tests are completely normal, you will receive your results only by: MyChart Message (if you have MyChart) OR A paper copy in the mail If you have any lab test that is abnormal or we need to change your treatment, we will call you to review the results.   Testing/Procedures: Your physician has requested that you have an echocardiogram. Echocardiography is a painless test that uses sound waves to create images of your heart. It provides your doctor with information about the size and shape of your heart and how well your heart's chambers and valves are working. This procedure takes approximately one hour. There are no restrictions for this procedure. Please do NOT wear cologne, perfume, aftershave, or lotions (deodorant is allowed). Please arrive 15 minutes prior to your appointment time.     Your cardiac CT will be scheduled at one of the below locations:   [facility] [address] [place], [place] [address] [phone]  OR  [facility] [address] [place], [place] [address] [phone]  OR   [facility] [address] [place], [place] [address] [phone]  OR   [facility] [address] [place], [place] [address] [phone]  OR   [facility] [address]  [place], [place] [address]  If scheduled at [facility], please arrive at the [facility] and Children's Entrance (Entrance C2) of [facility] 30 minutes prior to test start time. You can use the FREE valet parking offered at entrance C (encouraged to  control the heart rate for the test)  Proceed to the [facility] Radiology Department (first floor) to check-in and test prep.   All radiology patients and guests should use entrance C2 at [facility], accessed from [facility], even though the hospital's physical address listed is [address].    If scheduled at the Heart and Vascular Tower at [address], please enter the parking lot using the [address] entrance and use the FREE valet service at the patient drop-off area. Enter the buidling and check-in with registration on the main floor.  If scheduled at [facility] or [facility], please arrive 15 mins early for check-in and test prep.  There is spacious parking and easy access to the radiology department from the [facility] V[facility] entrance. Please enter here and check-in with the desk attendant.   If scheduled at [facility], please arrive 30 minutes early for check-in and test prep.  Please follow these instructions carefully (unless otherwise directed):  An IV will be required for this test and Nitroglycerin  will be given.  Hold all erectile dysfunction medications at least 3 days (72 hrs) prior to test. (Ie viagra, cialis, sildenafil, tadalafil, etc)   On the Night Before the Test: Be sure to Drink plenty of water . Do not consume any caffeinated/decaffeinated beverages or chocolate 12 hours prior to your test. Do not take any antihistamines 12 hours prior to your test.- - Azelastine (Astelin ), Fluticasone  (Flonase ), Levocetirizine (Xyzal )  On the Day of the Test: Drink plenty of water  until 1  hour prior to the test. Do not eat any food 1 hour prior to test. You may take your regular medications prior to the test.  Take metoprolol  (Lopressor )100 mg two hours prior to test. If you take Furosemide /Hydrochlorothiazide /Spironolactone/Chlorthalidone, please HOLD on the morning of the  test. Patients who wear a continuous glucose monitor MUST remove the device prior to scanning. FEMALES- please wear underwire-free bra if available, avoid dresses & tight clothing        After the Test: Drink plenty of water . After receiving IV contrast, you may experience a mild flushed feeling. This is normal. On occasion, you may experience a mild rash up to 24 hours after the test. This is not dangerous. If this occurs, you can take Benadryl  25 mg, Zyrtec , Claritin , or Allegra  and increase your fluid intake. (Patients taking Tikosyn should avoid Benadryl , and may take Zyrtec , Claritin , or Allegra ) If you experience trouble breathing, this can be serious. If it is severe call 911 IMMEDIATELY. If it is mild, please call our office.  We will call to schedule your test 2-4 weeks out understanding that some insurance companies will need an authorization prior to the service being performed.   For more information and frequently asked questions, please visit our website : [email or web address]  For non-scheduling related questions, please contact the cardiac imaging nurse navigator should you have any questions/concerns: Cardiac Imaging Nurse Navigators Direct Office Dial: [phone]   For scheduling needs, including cancellations and rescheduling, please call [place], [phone].   Please note: We ask at that you not bring children with you during ultrasound (echo/ vascular) testing. Due to room size and safety concerns, children are not allowed in the ultrasound rooms during exams. Our front office staff cannot provide observation of children in our lobby area while testing is being conducted. An adult accompanying a patient to their appointment will only be allowed in the ultrasound room at the discretion of the ultrasound technician under special circumstances. We apologize for any inconvenience.    Follow-Up: At [facility], you and your health needs are our  priority.  As part of our continuing mission to provide you with exceptional heart care, we have created designated Provider Care Teams.  These Care Teams include your primary Cardiologist (physician) and Advanced Practice Providers (APPs -  Physician Assistants and Nurse Practitioners) who all work together to provide you with the care you need, when you need it.  We recommend signing up for the patient portal called MyChart.  Sign up information is provided on this After Visit Summary.  MyChart is used to connect with patients for Virtual Visits (Telemedicine).  Patients are able to view lab/test results, encounter notes, upcoming appointments, etc.  Non-urgent messages can be sent to your provider as well.   To learn more about what you can do with MyChart, go to [email or web address].    Your next appointment:   6-8 week(s)  Provider:   Establish care with Kardie Tobb  or any available APP   Other Instructions Thank you for choosing [facility]!

## 2023-12-06 ENCOUNTER — Encounter: Payer: Self-pay | Admitting: Nurse Practitioner

## 2023-12-06 LAB — CBC
Hematocrit: 41.9 % (ref 34.0–46.6)
Hemoglobin: 12.8 g/dL (ref 11.1–15.9)
MCH: 28.4 pg (ref 26.6–33.0)
MCHC: 30.5 g/dL — ABNORMAL LOW (ref 31.5–35.7)
MCV: 93 fL (ref 79–97)
Platelets: 243 10*3/uL (ref 150–450)
RBC: 4.51 x10E6/uL (ref 3.77–5.28)
RDW: 11.7 % (ref 11.7–15.4)
WBC: 7.9 10*3/uL (ref 3.4–10.8)

## 2023-12-06 LAB — COMPREHENSIVE METABOLIC PANEL WITH GFR
ALT: 10 IU/L (ref 0–32)
AST: 13 IU/L (ref 0–40)
Albumin: 4.3 g/dL (ref 3.8–4.9)
Alkaline Phosphatase: 118 IU/L (ref 44–121)
BUN/Creatinine Ratio: 24 — ABNORMAL HIGH (ref 9–23)
BUN: 31 mg/dL — ABNORMAL HIGH (ref 6–24)
Bilirubin Total: 0.3 mg/dL (ref 0.0–1.2)
CO2: 21 mmol/L (ref 20–29)
Calcium: 9.6 mg/dL (ref 8.7–10.2)
Chloride: 101 mmol/L (ref 96–106)
Creatinine, Ser: 1.31 mg/dL — ABNORMAL HIGH (ref 0.57–1.00)
Globulin, Total: 2.9 g/dL (ref 1.5–4.5)
Glucose: 285 mg/dL — ABNORMAL HIGH (ref 70–99)
Potassium: 4.3 mmol/L (ref 3.5–5.2)
Sodium: 139 mmol/L (ref 134–144)
Total Protein: 7.2 g/dL (ref 6.0–8.5)
eGFR: 47 mL/min/{1.73_m2} — ABNORMAL LOW (ref >59)

## 2023-12-06 LAB — BRAIN NATRIURETIC PEPTIDE: BNP: 80.2 pg/mL (ref 0.0–100.0)

## 2023-12-07 ENCOUNTER — Encounter: Payer: Self-pay | Admitting: Neurology

## 2023-12-07 ENCOUNTER — Ambulatory Visit: Payer: MEDICAID | Admitting: Neurology

## 2023-12-07 NOTE — Telephone Encounter (Signed)
 See cardiology office visit recommendations below:   Assessment & Plan    1. Precordial chest pain/shortness of breath: Echocardiogram in 2023 showed EF 60 to 65%, no RWMA, moderate LVH, G1 DD, normal RV systolic function. Coronary CTA in 12/2021 revealed coronary calcium  score of 0, normal coronary arteries. She reports a 59-month history of nearly constant chest tightness both at rest and with activity, as well as increased shortness of breath both at rest and with activity, mild orthopnea.  Euvolemic and well compensated on exam. Symptoms are overall atypical.  However, given need for surgical clearance, will update coronary CT angiogram, echocardiogram.  ow suspicion for cardiac etiology.  Unable to complete ETT in the setting of arthritis, s/p bilateral knee replacements.  Will check CBC, CMET, BNP today.  Reviewed ED precautions.    2. Hypertension: BP well controlled. Continue current antihypertensive regimen.     3. Hyperlipidemia: LDL was 71 in 12/2022.  Continue Lipitor.   4. History of CVA: No residual.  Continue Lipitor.   5. Type 2 diabetes: A1c was 7.2 in 12/2022. Monitored and managed per PCP.    6. OSA: Nonadherent to CPAP.  She reports she is pending repeat sleep study per PCP.   7. RUQ pain: She reports a 1 month history of right upper quadrant abdominal pain.  Recent CT of the abdomen/pelvis was overall unremarkable for acute findings.  She is following with GI.   8. Preoperative cardiac exam: According to the Revised Cardiac Risk Index (RCRI), her Perioperative Risk of Major Cardiac Event is (%): 6.6. Her Functional Capacity in METs is: 5.07 according to the Duke Activity Status Index (DASI).  Pending coronary CTA, echocardiogram as above.  If testing unremarkable, she will be cleared for surgery.   9. Disposition: Follow-up in 6 to 8 weeks, sooner if needed.  Previously followed by Dr. Burnard.  She wishes to establish with Dr. Sheena going forward.  Maureen JAYSON Braver, NP 12/04/2023,  9:19 AM

## 2023-12-07 NOTE — Telephone Encounter (Signed)
 Called patient and explained that cardiology wants to do further testing including an echo and cardiac CT before they will give her clearance for the EGD/Colon. Informed patient that we have to r/s her EGD/Colon for sometime in August since her echo is scheduled for 01/19/24. Patient rescheduled her EGD/Colon on 02/03/24 at 1:00pm. Will wait on clearance after tests come back.

## 2023-12-08 MED ORDER — NA SULFATE-K SULFATE-MG SULF 17.5-3.13-1.6 GM/177ML PO SOLN: 1.0000 | Freq: Once | ORAL | 0 refills | Status: AC

## 2023-12-09 ENCOUNTER — Telehealth: Payer: Self-pay | Admitting: Gastroenterology

## 2023-12-09 NOTE — Telephone Encounter (Signed)
 Inbound call from patient requesting a call to discuss prep instructions further. Requesting to know if she is able to take magnesium  citrate instead of miralax . Please advise, thank you.

## 2023-12-09 NOTE — Telephone Encounter (Signed)
 Patient asked if she can take her cardiac medications for the day of the cardiac CT. Informed patient that we do not schedule this test and she will have to contact her cardiology office with questions regarding that test. Patient verbalized understanding. Patient also asked if she can drink Mag citrate in the place of Miralax  for the 2 day prep prior before the colonoscopy. Informed patient she needs to drink the Miralax . The mag citrate may not clean her bowels out enough and would not want her to have to repeat the procedures. Patient verbalized understanding.

## 2023-12-10 ENCOUNTER — Ambulatory Visit: Payer: Self-pay | Admitting: Nurse Practitioner

## 2023-12-14 ENCOUNTER — Ambulatory Visit (INDEPENDENT_AMBULATORY_CARE_PROVIDER_SITE_OTHER): Payer: MEDICAID | Admitting: Podiatry

## 2023-12-14 ENCOUNTER — Encounter: Payer: Self-pay | Admitting: Podiatry

## 2023-12-14 DIAGNOSIS — E1142 Type 2 diabetes mellitus with diabetic polyneuropathy: Secondary | ICD-10-CM

## 2023-12-14 DIAGNOSIS — M79675 Pain in left toe(s): Secondary | ICD-10-CM

## 2023-12-14 DIAGNOSIS — B351 Tinea unguium: Secondary | ICD-10-CM

## 2023-12-14 DIAGNOSIS — M79674 Pain in right toe(s): Secondary | ICD-10-CM

## 2023-12-14 NOTE — Progress Notes (Signed)
 This patient returns to my office for at risk foot care.  This patient requires this care by a professional since this patient will be at risk due to having diabetes.    This patient is unable to cut nails herself since the patient cannot reach her nails.These nails are painful walking and wearing shoes.  This patient presents for at risk foot care today.  General Appearance  Alert, conversant and in no acute stress.  Vascular  Dorsalis pedis and posterior tibial  pulses are palpable  bilaterally.  Capillary return is within normal limits  bilaterally. Temperature is within normal limits  bilaterally.  Neurologic  Senn-Weinstein monofilament wire test within normal limits  bilaterally. Muscle power within normal limits bilaterally.  Nails Thick disfigured discolored nails with subungual debris  from hallux to fifth toes bilaterally. No evidence of bacterial infection or drainage bilaterally.  Orthopedic  No limitations of motion  feet .  No crepitus or effusions noted.  No bony pathology or digital deformities noted.  Skin  normotropic skin with no porokeratosis noted bilaterally.  No signs of infections or ulcers noted.     Onychomycosis  Pain in right toes  Pain in left toes  Consent was obtained for treatment procedures.   Mechanical debridement of nails 1-5  bilaterally performed with a nail nipper.  Filed with dremel without incident.    Return office visit    3 months                  Told patient to return for periodic foot care and evaluation due to potential at risk complications.   Helane Gunther DPM

## 2023-12-15 ENCOUNTER — Ambulatory Visit (HOSPITAL_COMMUNITY): Payer: MEDICAID

## 2023-12-15 NOTE — Telephone Encounter (Signed)
 Spoke with pt. Pt was notified of lab results and recommendations. Pt will continue mediation, proceed with cardiac ct, echo and f/u as planned.

## 2023-12-21 ENCOUNTER — Telehealth: Payer: Self-pay | Admitting: Gastroenterology

## 2023-12-21 NOTE — Telephone Encounter (Signed)
 Reminded patient that her procedure was rescheduled while we waited on her cardiac testing to be completed. Patient reports she forgot and took her Miralax  prep already. Offered to send patient new instructions on MyChart or through the mail. Patient states she does not use MyChart and to please mail the instructions to her. New instructions printed and mailed to patient.

## 2023-12-21 NOTE — Telephone Encounter (Signed)
 Patient called and stated that she has taken her Miralax  and Dulcolax due to her thinking that her procedure was scheduled for July the 8 th. Patient was advise that her is for August the 20 th at 1:00 pm. Patient is requesting a call back to find out what she is needing to do. Please advise.

## 2023-12-22 ENCOUNTER — Encounter: Payer: MEDICAID | Admitting: Internal Medicine

## 2023-12-22 ENCOUNTER — Encounter (HOSPITAL_COMMUNITY): Payer: Self-pay

## 2023-12-22 ENCOUNTER — Ambulatory Visit (HOSPITAL_COMMUNITY): Payer: MEDICAID

## 2023-12-23 ENCOUNTER — Telehealth (HOSPITAL_COMMUNITY): Payer: Self-pay | Admitting: Emergency Medicine

## 2023-12-23 NOTE — Telephone Encounter (Signed)
 Reaching out to patient to offer assistance regarding upcoming cardiac imaging study; pt verbalizes understanding of appt date/time, parking situation and where to check in, pre-test NPO status and medications ordered, and verified current allergies; name and call back number provided for further questions should they arise Rockwell Alexandria RN Navigator Cardiac Imaging [facility] [phone] office [phone] cell

## 2023-12-24 ENCOUNTER — Ambulatory Visit (HOSPITAL_COMMUNITY)
Admission: RE | Admit: 2023-12-24 | Discharge: 2023-12-24 | Disposition: A | Discharge status: Home / Self Care | Payer: MEDICAID | Source: Ambulatory Visit | Attending: Nurse Practitioner | Admitting: Nurse Practitioner

## 2023-12-24 DIAGNOSIS — R072 Precordial pain: Secondary | ICD-10-CM

## 2023-12-24 DIAGNOSIS — I2510 Atherosclerotic heart disease of native coronary artery without angina pectoris: Secondary | ICD-10-CM | POA: Diagnosis not present

## 2023-12-24 MED ORDER — NITROGLYCERIN 0.4 MG SL SUBL
SUBLINGUAL_TABLET | SUBLINGUAL | Status: AC
  Filled 2023-12-24: qty 2

## 2023-12-24 MED ORDER — NITROGLYCERIN 0.4 MG SL SUBL
0.8000 mg | SUBLINGUAL_TABLET | Freq: Once | SUBLINGUAL | Status: AC
  Administered 2023-12-24: 0.8 mg via SUBLINGUAL

## 2023-12-24 MED ORDER — IOHEXOL 350 MG/ML SOLN
100.0000 mL | Freq: Once | INTRAVENOUS | Status: AC | PRN
  Administered 2023-12-24: 100 mL via INTRAVENOUS

## 2024-01-06 ENCOUNTER — Other Ambulatory Visit: Payer: Self-pay | Admitting: *Deleted

## 2024-01-06 MED ORDER — AZELASTINE HCL 0.1 % NA SOLN: 2.0000 | Freq: Two times a day (BID) | NASAL | 0 refills | Status: AC | PRN

## 2024-01-06 MED ORDER — FLUTICASONE PROPIONATE 50 MCG/ACT NA SUSP: 2.0000 | Freq: Every day | NASAL | 0 refills | Status: AC

## 2024-01-19 ENCOUNTER — Ambulatory Visit (HOSPITAL_COMMUNITY)
Admission: RE | Admit: 2024-01-19 | Discharge: 2024-01-19 | Disposition: A | Discharge status: Home / Self Care | Payer: MEDICAID | Source: Ambulatory Visit | Attending: Cardiology | Admitting: Cardiology

## 2024-01-19 DIAGNOSIS — E782 Mixed hyperlipidemia: Secondary | ICD-10-CM | POA: Insufficient documentation

## 2024-01-19 DIAGNOSIS — E119 Type 2 diabetes mellitus without complications: Secondary | ICD-10-CM | POA: Insufficient documentation

## 2024-01-19 DIAGNOSIS — R072 Precordial pain: Secondary | ICD-10-CM | POA: Insufficient documentation

## 2024-01-19 DIAGNOSIS — I10 Essential (primary) hypertension: Secondary | ICD-10-CM | POA: Insufficient documentation

## 2024-01-19 DIAGNOSIS — Z8673 Personal history of transient ischemic attack (TIA), and cerebral infarction without residual deficits: Secondary | ICD-10-CM | POA: Diagnosis present

## 2024-01-19 DIAGNOSIS — R0602 Shortness of breath: Secondary | ICD-10-CM | POA: Diagnosis not present

## 2024-01-19 DIAGNOSIS — Z01810 Encounter for preprocedural cardiovascular examination: Secondary | ICD-10-CM | POA: Insufficient documentation

## 2024-01-19 LAB — ECHOCARDIOGRAM COMPLETE
AR max vel: 3.54 cm2
AV Area VTI: 3.61 cm2
AV Area mean vel: 3.59 cm2
AV Mean grad: 4 mmHg
AV Peak grad: 6.8 mmHg
Ao pk vel: 1.3 m/s
S' Lateral: 3.32 cm

## 2024-01-19 NOTE — Telephone Encounter (Signed)
   Patient Name: Maureen Swanson  DOB: 1966/01/22 MRN: [id number]  Primary Cardiologist: Debby Sor, MD (Inactive)  Chart reviewed as part of pre-operative protocol coverage. Given past medical history and time since last visit, based on ACC/AHA guidelines, Maureen Swanson is at acceptable risk for the planned procedure without further cardiovascular testing.   Cardiac CTA 12/2023 with minimal nonobstructive CAD. Echo with normal LVEF, no significant abnormalities.   I will route this recommendation to the requesting party via Epic fax function and remove from pre-op pool.  Please call with questions.  Reche GORMAN Finder, NP 01/19/2024, 4:30 PM

## 2024-01-20 NOTE — Telephone Encounter (Signed)
 Patient has been cleared for procedures in the [facility] on 02/03/24. Patient aware. Sending message to Camie Furbish, PA.

## 2024-01-29 ENCOUNTER — Ambulatory Visit: Payer: MEDICAID | Admitting: Neurology

## 2024-02-03 ENCOUNTER — Telehealth: Payer: Self-pay | Admitting: Internal Medicine

## 2024-02-03 ENCOUNTER — Ambulatory Visit (AMBULATORY_SURGERY_CENTER): Payer: MEDICAID | Admitting: Internal Medicine

## 2024-02-03 ENCOUNTER — Encounter: Payer: Self-pay | Admitting: Internal Medicine

## 2024-02-03 VITALS — BP 165/92 | HR 82 | Temp 98.0°F | Resp 11 | Ht 64.0 in | Wt 231.0 lb

## 2024-02-03 DIAGNOSIS — Z1211 Encounter for screening for malignant neoplasm of colon: Secondary | ICD-10-CM

## 2024-02-03 DIAGNOSIS — K219 Gastro-esophageal reflux disease without esophagitis: Secondary | ICD-10-CM

## 2024-02-03 DIAGNOSIS — R1310 Dysphagia, unspecified: Secondary | ICD-10-CM

## 2024-02-03 DIAGNOSIS — K644 Residual hemorrhoidal skin tags: Secondary | ICD-10-CM | POA: Diagnosis not present

## 2024-02-03 DIAGNOSIS — R1319 Other dysphagia: Secondary | ICD-10-CM

## 2024-02-03 DIAGNOSIS — K648 Other hemorrhoids: Secondary | ICD-10-CM

## 2024-02-03 MED ORDER — SODIUM CHLORIDE 0.9 % IV SOLN: 500.0000 mL | INTRAVENOUS | Status: DC

## 2024-02-03 NOTE — Patient Instructions (Addendum)
 The esophagus stomach and upper intestine looked normal.  I did stretch for dilate the esophagus to try to help you swallow better.  I have to think that a big part of that issue is related to lack of teeth.  If you could get dentures that would help.  You need to cut your food very small or even chop it if necessary.  Please stay on your Protonix  (pantoprazole ).  Colonoscopy did not show any polyps or cancer.  You do have internal hemorrhoids and that is why you see some blood at times.  Regarding your constipation I recommend you take MiraLAX  every single day.  Next routine colonoscopy or other screening test in 10 years - 2035.   I appreciate the opportunity to care for you. Lupita CHARLENA Commander, MD, [facility]  Follow dilation diet today!!  See handout!!  Repeat colonoscopy in 10 years for screening purposes take MiraLax  every day for constipation  Clear liquids x 1 hour then soft foods rest of day. Start prior diet tomorrow but cut food very small/chop as needed. suspect dentures would help. Continue present medications. Stay on twice daily pantoprazole   YOU HAD AN ENDOSCOPIC PROCEDURE TODAY AT THE [facility]:   Refer to the procedure report that was given to you for any specific questions about what was found during the examination.  If the procedure report does not answer your questions, please call your gastroenterologist to clarify.  If you requested that your care partner not be given the details of your procedure findings, then the procedure report has been included in a sealed envelope for you to review at your convenience later.  YOU SHOULD EXPECT: Some feelings of bloating in the abdomen. Passage of more gas than usual.  Walking can help get rid of the air that was put into your GI tract during the procedure and reduce the bloating. If you had a lower endoscopy (such as a colonoscopy or flexible sigmoidoscopy) you may notice spotting of blood in your stool or on the toilet paper.  If you underwent a bowel prep for your procedure, you may not have a normal bowel movement for a few days.  Please Note:  You might notice some irritation and congestion in your nose or some drainage.  This is from the oxygen used during your procedure.  There is no need for concern and it should clear up in a day or so.  SYMPTOMS TO REPORT IMMEDIATELY:  Following lower endoscopy (colonoscopy or flexible sigmoidoscopy):  Excessive amounts of blood in the stool  Significant tenderness or worsening of abdominal pains  Swelling of the abdomen that is new, acute  Fever of 100F or higher  Following upper endoscopy (EGD)  Vomiting of blood or coffee ground material  New chest pain or pain under the shoulder blades  Painful or persistently difficult swallowing  New shortness of breath  Fever of 100F or higher  Black, tarry-looking stools  For urgent or emergent issues, a gastroenterologist can be reached at any hour by calling [phone]. Do not use MyChart messaging for urgent concerns.    DIET:   Drink plenty of fluids but you should avoid alcoholic beverages for 24 hours.  ACTIVITY:  You should plan to take it easy for the rest of today and you should NOT DRIVE or use heavy machinery until tomorrow (because of the sedation medicines used during the test).    FOLLOW UP: Our staff will call the number listed on your records the next  business day following your procedure.  We will call around 7:15- 8:00 am to check on you and address any questions or concerns that you may have regarding the information given to you following your procedure. If we do not reach you, we will leave a message.     If any biopsies were taken you will be contacted by phone or by letter within the next 1-3 weeks.  Please call us  at [phone] if you have not heard about the biopsies in 3 weeks.    SIGNATURES/CONFIDENTIALITY: You and/or your care partner have signed paperwork which will be entered into  your electronic medical record.  These signatures attest to the fact that that the information above on your After Visit Summary has been reviewed and is understood.  Full responsibility of the confidentiality of this discharge information lies with you and/or your care-partner.

## 2024-02-03 NOTE — Progress Notes (Signed)
 Report to PACU, RN, vss, BBS= Clear.

## 2024-02-03 NOTE — Telephone Encounter (Signed)
 Inbound call from [facility] in regards to patient. States patient hands are currently red, also states patient is sweating and shaking.   Aleck also states she will call ems to come and check patient to make sure she is not having a panic attack and to check patients blood pressure to make sure everything is stable.   Will call back to the office with a f/u.

## 2024-02-03 NOTE — Op Note (Signed)
 [facility] Patient Name: Maureen Swanson Procedure Date: 02/03/2024 2:19 PM MRN: [id number] Endoscopist: Lupita FORBES Commander , MD, [id number] Age: 58 Referring MD:  Date of Birth: December 25, 1965 Gender: Female Account #: [id number] Procedure:                Colonoscopy Indications:              Screening for colorectal malignant neoplasm Medicines:                Monitored Anesthesia Care Procedure:                Pre-Anesthesia Assessment:                           - Prior to the procedure, a History and Physical                            was performed, and patient medications and                            allergies were reviewed. The patient's tolerance of                            previous anesthesia was also reviewed. The risks                            and benefits of the procedure and the sedation                            options and risks were discussed with the patient.                            All questions were answered, and informed consent                            was obtained. Prior Anticoagulants: The patient has                            taken no anticoagulant or antiplatelet agents. ASA                            Grade Assessment: III - A patient with severe                            systemic disease. After reviewing the risks and                            benefits, the patient was deemed in satisfactory                            condition to undergo the procedure.                           After obtaining informed consent, the colonoscope  was passed under direct vision. Throughout the                            procedure, the patient's blood pressure, pulse, and                            oxygen saturations were monitored continuously. The                            Olympus CF-HQ190L ([id number]) Colonoscope was                            introduced through the anus and advanced to the the                            cecum,  identified by appendiceal orifice and                            ileocecal valve. The colonoscopy was performed                            without difficulty. The patient tolerated the                            procedure well. The quality of the bowel                            preparation was good. The ileocecal valve,                            appendiceal orifice, and rectum were photographed.                            The bowel preparation used was MiraLax  + SUPREP via                            extended prep with split dose instruction. Scope In: 2:42:20 PM Scope Out: 2:54:49 PM Scope Withdrawal Time: 0 hours 9 minutes 27 seconds  Total Procedure Duration: 0 hours 12 minutes 29 seconds  Findings:                 Skin tags were found on perianal exam.                           Internal hemorrhoids were found.                           The exam was otherwise without abnormality on                            direct and retroflexion views. Complications:            No immediate complications. Estimated Blood Loss:     Estimated blood loss: none. Impression:               - Perianal  skin tags found on perianal exam.                           - Internal hemorrhoids.                           - The examination was otherwise normal on direct                            and retroflexion views.                           - No specimens collected. Recommendation:           - Patient has a contact number available for                            emergencies. The signs and symptoms of potential                            delayed complications were discussed with the                            patient. Return to normal activities tomorrow.                            Written discharge instructions were provided to the                            patient.                           - See the other procedure note for documentation of                            additional recommendations.                            - Repeat colonoscopy in 10 years for screening                            purposes.                           - take MiraLax  every day for constipation Lupita FORBES Commander, MD 02/03/2024 3:09:36 PM This report has been signed electronically.

## 2024-02-03 NOTE — Progress Notes (Signed)
 [facility] History and Physical   Primary Care Physician:  Alec House, MD   Reason for Procedure:    Encounter Diagnoses  Name Primary?   Esophageal dysphagia Yes   Colon cancer screening      Plan:    EGD with possible esophageal dilation and colonoscopy     HPI: Maureen Swanson is a 58 y.o. female with a history of obesity, CHF, GERD, gastric ulcer, hypertension and type 2 diabetes mellitus as well as sleep apnea and a stroke, presenting for evaluation and possible treatment of dysphagia.  Described problems especially with pasta bread and meat to Lauraine Furbish PA-C in May office visit.  Was having reflux and heartburn symptoms despite PPI.  Morning nausea and occasional vomiting.  She is edentulous  Complained of 2 years of constipation and 3 to 4 months of lower abdominal pain in May.  She was seen and evaluated by cardiology and was cleared to proceed with sedated endoscopic procedures. Past Medical History:  Diagnosis Date   Allergy     Anemia    Anxiety    Arthritis    back and knees   Asthma    daily and prn inhalers   Atypical ductal hyperplasia of breast 03/2012   right   Bipolar 1 disorder (HCC)    CHF (congestive heart failure) (HCC)    Depression    Diabetes mellitus    diet-controlled   Gastric ulcer    GERD (gastroesophageal reflux disease)    Glaucoma    Gout    Headache(784.0)    migraines   Heart murmur    High cholesterol    Hypertension    under control, has been on med. x 12 yrs.   Sleep apnea    Stroke (HCC)    1995   Mild. no residual problems   Substance abuse (HCC)    crack cocaine:  extensive treatment with [facility] [place] from 2014 to 2018 for this and alcohol  abuse.   TMJ (temporomandibular joint disorder)     Past Surgical History:  Procedure Laterality Date   BREAST EXCISIONAL BIOPSY Right    BREAST LUMPECTOMY WITH NEEDLE LOCALIZATION  04/19/2012   Procedure: BREAST LUMPECTOMY WITH NEEDLE  LOCALIZATION;  Surgeon: Deward GORMAN Curvin DOUGLAS, MD;  Location: [facility];  Service: General;  Laterality: Right;   FOOT SURGERY Left 2022   [facility]   KNEE ARTHROPLASTY Left 02/19/2022   Procedure: COMPUTER ASSISTED TOTAL KNEE ARTHROPLASTY;  Surgeon: Fidel Rogue, MD;  Location: [facility];  Service: Orthopedics;  Laterality: Left;   KNEE ARTHROPLASTY Right 12/11/2022   Procedure: RIGHT COMPUTER ASSISTED TOTAL KNEE ARTHROPLASTY;  Surgeon: Fidel Rogue, MD;  Location: [facility];  Service: Orthopedics;  Laterality: Right;   KNEE ARTHROSCOPY W/ PARTIAL MEDIAL MENISCECTOMY  05/01/2010   right     Current Outpatient Medications  Medication Sig Dispense Refill   Accu-Chek Softclix Lancets lancets CHECK BLOOD SUGAR TWICE A DAY 200 each 3   atorvastatin  (LIPITOR) 40 MG tablet Take 1 tablet (40 mg total) by mouth at bedtime. 30 tablet 11   glimepiride (AMARYL) 2 MG tablet Take 2 mg by mouth daily with breakfast.     hydrochlorothiazide  (HYDRODIURIL ) 25 MG tablet TAKE ONE TABLET BY MOUTH DAILY IN THE MORNING WITH BREAKFAST 30 tablet 4   lamoTRIgine  (LAMICTAL ) 100 MG tablet Take 100 mg by mouth daily.     semaglutide-weight management (WEGOVY) 0.5 MG/0.5ML SOAJ SQ injection Inject  0.5 mg into the skin.     ADVAIR  DISKUS 100-50 MCG/ACT AEPB INHALE 1 PUFF BY MOUTH EVERY 12 HOURS AND RINSE MOUTH AFTER USE 60 each 8   albuterol  (VENTOLIN  HFA) 108 (90 Base) MCG/ACT inhaler Inhale 1-2 puffs into the lungs every 6 (six) hours as needed for wheezing or shortness of breath. 18 g 1   amLODipine  (NORVASC ) 10 MG tablet Take 1 tablet (10 mg total) by mouth daily. 1 tab by mouth daily 30 tablet 11   atenolol  (TENORMIN ) 100 MG tablet TAKE ONE TABLET BY MOUTH DAILY 30 tablet 6   azelastine  (ASTELIN ) 0.1 % nasal spray Place 2 sprays into both nostrils 2 (two) times daily as needed. (Patient not taking: Reported on 02/03/2024) 30 mL 0   budesonide -formoterol  (SYMBICORT ) 160-4.5 MCG/ACT inhaler Inhale 2  puffs into the lungs in the morning and at bedtime. 1 each 5   cyclobenzaprine  (FLEXERIL ) 5 MG tablet TAKE ONE TABLET BY MOUTH AT BEDTIME AS NEEDED 30 tablet 8   diclofenac  Sodium (VOLTAREN ) 1 % GEL APPLY TWO GRAMS TO THE KNEES UP TO FOUR TIMES A DAY AS NEEDED FOR PAIN 100 g 6   empagliflozin  (JARDIANCE ) 10 MG TABS tablet Take 1 tablet (10 mg total) by mouth daily before breakfast. 30 tablet 11   fluticasone  (FLONASE ) 50 MCG/ACT nasal spray Place 2 sprays into both nostrils daily. 16 g 0   gabapentin  (NEURONTIN ) 300 MG capsule Take 300 mg by mouth 3 (three) times daily.     glucose blood (ACCU-CHEK GUIDE TEST) test strip CHECK BLOOD SUGAR TWICE A DAY 200 each 3   hydrocortisone  (ANUSOL -HC) 2.5 % rectal cream Apply sparingly to rectum up to 5-7 days for hemorrhoids 30 g 0   insulin  glargine (LANTUS  SOLOSTAR) 100 UNIT/ML Solostar Pen Inject 16 Units into the skin daily.     levocetirizine (XYZAL ) 5 MG tablet Take 1 tablet (5 mg total) by mouth every evening. 30 tablet 5   lidocaine  (LIDODERM ) 5 % Place 1 patch onto the skin daily. Remove & Discard patch within 12 hours or as directed by MD 30 patch 0   losartan  (COZAAR ) 100 MG tablet TAKE ONE TABLET BY MOUTH DAILY IN THE MORNING 30 tablet 4   melatonin 3 MG TABS tablet Take 1 tablet (3 mg total) by mouth at bedtime. 30 tablet 0   meloxicam  (MOBIC ) 15 MG tablet Take 1 tablet (15 mg total) by mouth daily. 21 tablet 0   metFORMIN  (GLUCOPHAGE ) 1000 MG tablet TAKE ONE TABLET BY MOUTH TWICE A DAY WITH MEAL 60 tablet 6   methocarbamol  (ROBAXIN ) 500 MG tablet Take 1 tablet (500 mg total) by mouth 2 (two) times daily. 20 tablet 0   metoprolol  tartrate (LOPRESSOR ) 100 MG tablet Take 1 tablet (100 mg total) by mouth once for 1 dose. Take 2 hours prior to Cardiac CT 1 tablet 0   omeprazole  (PRILOSEC) 40 MG capsule Take 40 mg by mouth daily.     ondansetron  (ZOFRAN -ODT) 4 MG disintegrating tablet Take 1 tablet (4 mg total) by mouth every 6 (six) hours as needed  for nausea or vomiting. 20 tablet 0   pantoprazole  (PROTONIX ) 40 MG tablet Take 1 tablet (40 mg total) by mouth 2 (two) times daily before a meal. 60 tablet 3   prazosin  (MINIPRESS ) 1 MG capsule Take 1 mg by mouth at bedtime.     Tiotropium Bromide Monohydrate  (SPIRIVA  RESPIMAT) 1.25 MCG/ACT AERS Inhale 2 puffs into the lungs daily. 4 g 5  trazodone  (DESYREL ) 300 MG tablet Take 300 mg by mouth at bedtime.     valbenazine  (INGREZZA ) 80 MG capsule Take 80 mg by mouth at bedtime. (Patient not taking: Reported on 02/03/2024)     Current Facility-Administered Medications  Medication Dose Route Frequency Provider Last Rate Last Admin   0.9 %  sodium chloride  infusion  500 mL Intravenous Continuous Avram Lupita BRAVO, MD        Allergies as of 02/03/2024 - Review Complete 02/03/2024  Allergen Reaction Noted   Chocolate Hives 07/07/2011   Penicillins Hives and Other (See Comments)    Orange Hives 07/07/2011   Strawberry extract Swelling 04/07/2019   Tomato Hives and Other (See Comments) 09/07/2012    Family History  Problem Relation Age of Onset   Diabetes Mother    Breast cancer Mother        She is not sure about this diagnosis   Cancer Father        Laryngeal   Multiple sclerosis Sister        not clear if this is her diagnosis   Bipolar disorder Maternal Aunt    Tremor Maternal Aunt    Alcoholism Maternal Uncle    Schizophrenia Maternal Grandfather     Social History   Socioeconomic History   Marital status: Single    Spouse name: Not on file   Number of children: 2   Years of education: 12   Highest education level: High school graduate  Occupational History   Not on file  Tobacco Use   Smoking status: Former    Current packs/day: 0.00    Average packs/day: 1 pack/day for 37.0 years (37.0 ttl pk-yrs)    Types: Cigarettes    Start date: 12/15/1983    Quit date: 12/14/2020    Years since quitting: 3.1    Passive exposure: Past   Smokeless tobacco: Never  Vaping Use    Vaping status: Never Used  Substance and Sexual Activity   Alcohol  use: Not Currently    Comment: History of abuse.  Clean for 9 months dated 12/31/2021   Drug use: Not Currently    Types: Cocaine, Crack cocaine    Comment: Clean for 9 months dated 12/31/2021   Sexual activity: Yes    Birth control/protection: Post-menopausal    Comment: Recurrent concerns for STIs with female partner, Abby  Other Topics Concern   Not on file  Social History Narrative   Transitional housing for substance abuse:  [facility] [employer] [facility].     Not clear how long she will be there--at least 1.5 to 2 years.     Has a relationship with son and sometimes with daughter.    Looking to go to [employer] with her [employer].   Social Drivers of Health   Financial Resource Strain: Not on file  Food Insecurity: No Food Insecurity (12/11/2022)   Hunger Vital Sign    Worried About Running Out of Food in the Last Year: Never true    Ran Out of Food in the Last Year: Never true  Transportation Needs: No Transportation Needs (12/11/2022)   PRAPARE - Administrator, Civil Service (Medical): No    Lack of Transportation (Non-Medical): No  Physical Activity: Not on file  Stress: Not on file  Social Connections: Not on file  Intimate Partner Violence: Not At Risk (12/11/2022)   Humiliation, Afraid, Rape, and Kick questionnaire    Fear of Current or Ex-Partner: No    Emotionally Abused:  No    Physically Abused: No    Sexually Abused: No    Review of Systems: Some anxiety issues earlier today seen by EMS All other review of systems negative except as mentioned in the HPI.  Physical Exam: Vital signs BP (!) 159/93   Pulse 84   Temp 98 F (36.7 C) (Temporal)   Resp 18   Ht 5' 4 (1.626 m)   Wt 231 lb (104.8 kg)   SpO2 97%   BMI 39.65 kg/m   General:   Alert,  Well-developed, well-nourished, pleasant and cooperative in NAD Lungs:  Clear throughout to auscultation.   Heart:  Regular rate and  rhythm; no murmurs, clicks, rubs,  or gallops. Abdomen:  Soft, nontender and nondistended. Normal bowel sounds.   Neuro/Psych:  Alert and cooperative. Normal mood and affect. A and O x 3   @Hulen Mandler  CHARLENA Commander, MD, [facility] [phone] (pager) 02/03/2024 2:22 PM@

## 2024-02-03 NOTE — Op Note (Signed)
 [facility] Patient Name: Maureen Swanson Procedure Date: 02/03/2024 2:20 PM MRN: [id number] Endoscopist: Lupita FORBES Commander , MD, [id number] Age: 58 Referring MD:  Date of Birth: March 21, 1966 Gender: Female Account #: [id number] Procedure:                Upper GI endoscopy Indications:              Dysphagia Medicines:                Monitored Anesthesia Care Procedure:                Pre-Anesthesia Assessment:                           - Prior to the procedure, a History and Physical                            was performed, and patient medications and                            allergies were reviewed. The patient's tolerance of                            previous anesthesia was also reviewed. The risks                            and benefits of the procedure and the sedation                            options and risks were discussed with the patient.                            All questions were answered, and informed consent                            was obtained. Prior Anticoagulants: The patient has                            taken no anticoagulant or antiplatelet agents. ASA                            Grade Assessment: III - A patient with severe                            systemic disease. After reviewing the risks and                            benefits, the patient was deemed in satisfactory                            condition to undergo the procedure.                           After obtaining informed consent, the endoscope was  passed under direct vision. Throughout the                            procedure, the patient's blood pressure, pulse, and                            oxygen saturations were monitored continuously. The                            GIF [id number] #[id number] was introduced through the                            mouth, and advanced to the second part of duodenum.                            The upper GI endoscopy was accomplished  without                            difficulty. The patient tolerated the procedure                            well. Scope In: Scope Out: Findings:                 No endoscopic abnormality was evident in the                            esophagus to explain the patient's complaint of                            dysphagia. It was decided, however, to proceed with                            dilation of the entire esophagus. The scope was                            withdrawn. Dilation was performed with a Maloney                            dilator with no resistance at 54 Fr. The dilation                            site was examined following endoscope reinsertion                            and showed no change. Estimated blood loss: none.                           The entire examined stomach was normal.                           The cardia and gastric fundus were normal on  retroflexion.                           The examined duodenum was normal. Complications:            No immediate complications. Estimated Blood Loss:     Estimated blood loss: none. Impression:               - No endoscopic esophageal abnormality to explain                            patient's dysphagia. Esophagus dilated. Dilated.                           - Normal stomach.                           - Normal examined duodenum.                           - No specimens collected. Recommendation:           - Patient has a contact number available for                            emergencies. The signs and symptoms of potential                            delayed complications were discussed with the                            patient. Return to normal activities tomorrow.                            Written discharge instructions were provided to the                            patient.                           - Clear liquids x 1 hour then soft foods rest of                            day. Start  prior diet tomorrow but cut food very                            small/chop as needed. suspect dentures would help.                           - Continue present medications. Stay on bid                            pantoprazole  Lupita FORBES Commander, MD 02/03/2024 3:05:22 PM This report has been signed electronically.

## 2024-02-04 ENCOUNTER — Telehealth: Payer: Self-pay

## 2024-02-04 NOTE — Telephone Encounter (Signed)
 Left message

## 2024-02-05 NOTE — Progress Notes (Signed)
 Assessment/Plan:   1.  Parkinsonism             -resolved off of abilify  injections  2.  Tardive dyskinesia             -last visit, I did not see this but certainly did today, off of ingrezza .  Likely now evident since ingrezza  has been stopped.  Ingrezza  probably d/c because of the parkinsonism.  Parkinsonism was likely from the Abilify , but Ingrezza  can contribute to that.  She states that she was told that the movements in her legs were possibly restless leg, and I assured her that was not the case.  She has no feeling that she needs or wants to move the legs and the lower leg movements are involuntary.  She also has mouth/tongue movements.  We discussed the nature and pathophysiology of tardive dyskinesia and that it can be permanent after exposure to antipsychotic medications.  We certainly can restart the Ingrezza , but after some discussion we decided to start Austedo XR.  She and I discussed that the risks are the same as those with Ingrezza .  There is a risk of parkinsonism as well, but her parkinsonism likely came from Abilify  injections.  She was given a starter pack, and then I filled out a prescription for Austedo XR, 36 mg daily.  Subjective:   Maureen Swanson was seen today in follow up for parkinsonism.  When I saw her in mid January, we discussed that parkinsonism was likely due to Abilify  (given via injection).  We also discussed that the Ingrezza  could also cause parkinsonism.  I told her to make an appointment with Dr. Marcelino, her psychiatrist, to discuss her medication options.  I did, however, tell her that it could take 6 months to resolve the symptoms, even if medication was discontinued.  She called me the very next month and made the appointment for June because she was still tremulous.  Unfortunately, she no showed that appt.  She tells me that she went off of the abilify  about 4 months ago and she went off the ingrezza  about the same time.  She states that he told her that  the leg movement was RLS.  However she cannot control the movements.  She no longer has the arm tremors - that got better' but the feet/legs are moving.      ALLERGIES:   Allergies  Allergen Reactions   Chocolate Hives   Penicillins Hives and Other (See Comments)        Orange Hives   Strawberry Extract Swelling   Tomato Hives and Other (See Comments)    acid foods    CURRENT MEDICATIONS:  Current Meds  Medication Sig   Accu-Chek Softclix Lancets lancets CHECK BLOOD SUGAR TWICE A DAY   ADVAIR  DISKUS 100-50 MCG/ACT AEPB INHALE 1 PUFF BY MOUTH EVERY 12 HOURS AND RINSE MOUTH AFTER USE   albuterol  (VENTOLIN  HFA) 108 (90 Base) MCG/ACT inhaler Inhale 1-2 puffs into the lungs every 6 (six) hours as needed for wheezing or shortness of breath.   amLODipine  (NORVASC ) 10 MG tablet Take 1 tablet (10 mg total) by mouth daily. 1 tab by mouth daily   atenolol  (TENORMIN ) 100 MG tablet TAKE ONE TABLET BY MOUTH DAILY   atorvastatin  (LIPITOR) 40 MG tablet Take 1 tablet (40 mg total) by mouth at bedtime.   azelastine  (ASTELIN ) 0.1 % nasal spray Place 2 sprays into both nostrils 2 (two) times daily as needed.   budesonide -formoterol  (SYMBICORT )  160-4.5 MCG/ACT inhaler Inhale 2 puffs into the lungs in the morning and at bedtime.   cyclobenzaprine  (FLEXERIL ) 5 MG tablet TAKE ONE TABLET BY MOUTH AT BEDTIME AS NEEDED   diclofenac  Sodium (VOLTAREN ) 1 % GEL APPLY TWO GRAMS TO THE KNEES UP TO FOUR TIMES A DAY AS NEEDED FOR PAIN   empagliflozin  (JARDIANCE ) 10 MG TABS tablet Take 1 tablet (10 mg total) by mouth daily before breakfast.   fluticasone  (FLONASE ) 50 MCG/ACT nasal spray Place 2 sprays into both nostrils daily.   gabapentin  (NEURONTIN ) 300 MG capsule Take 300 mg by mouth 3 (three) times daily. (Patient taking differently: Take 300 mg by mouth daily.)   glimepiride (AMARYL) 2 MG tablet Take 2 mg by mouth daily with breakfast.   glucose blood (ACCU-CHEK GUIDE TEST) test strip CHECK BLOOD SUGAR TWICE A  DAY   hydrochlorothiazide  (HYDRODIURIL ) 25 MG tablet TAKE ONE TABLET BY MOUTH DAILY IN THE MORNING WITH BREAKFAST   hydrocortisone  (ANUSOL -HC) 2.5 % rectal cream Apply sparingly to rectum up to 5-7 days for hemorrhoids   insulin  glargine (LANTUS  SOLOSTAR) 100 UNIT/ML Solostar Pen Inject 16 Units into the skin daily.   lamoTRIgine  (LAMICTAL ) 100 MG tablet Take 100 mg by mouth daily.   levocetirizine (XYZAL ) 5 MG tablet Take 1 tablet (5 mg total) by mouth every evening.   lidocaine  (LIDODERM ) 5 % Place 1 patch onto the skin daily. Remove & Discard patch within 12 hours or as directed by MD   losartan  (COZAAR ) 100 MG tablet TAKE ONE TABLET BY MOUTH DAILY IN THE MORNING   melatonin 3 MG TABS tablet Take 1 tablet (3 mg total) by mouth at bedtime.   metFORMIN  (GLUCOPHAGE ) 1000 MG tablet TAKE ONE TABLET BY MOUTH TWICE A DAY WITH MEAL   methocarbamol  (ROBAXIN ) 500 MG tablet Take 1 tablet (500 mg total) by mouth 2 (two) times daily.   metoprolol  tartrate (LOPRESSOR ) 100 MG tablet Take 1 tablet (100 mg total) by mouth once for 1 dose. Take 2 hours prior to Cardiac CT   ondansetron  (ZOFRAN -ODT) 4 MG disintegrating tablet Take 1 tablet (4 mg total) by mouth every 6 (six) hours as needed for nausea or vomiting.   pantoprazole  (PROTONIX ) 40 MG tablet Take 1 tablet (40 mg total) by mouth 2 (two) times daily before a meal.   prazosin  (MINIPRESS ) 1 MG capsule Take 1 mg by mouth at bedtime.   semaglutide-weight management (WEGOVY) 0.5 MG/0.5ML SOAJ SQ injection Inject 0.5 mg into the skin.   Tiotropium Bromide Monohydrate  (SPIRIVA  RESPIMAT) 1.25 MCG/ACT AERS Inhale 2 puffs into the lungs daily.   trazodone  (DESYREL ) 300 MG tablet Take 300 mg by mouth at bedtime.     Objective:   PHYSICAL EXAMINATION:    VITALS:   Vitals:   02/09/24 1125  BP: 132/86  Pulse: 78  SpO2: 98%  Weight: 200 lb 12.8 oz (91.1 kg)    GEN:  The patient appears stated age and is in NAD. HEENT:  Normocephalic, atraumatic.  The  mucous membranes are moist. The superficial temporal arteries are without ropiness or tenderness. CV:  RRR Lungs:  CTAB Neck/HEME:  There are no carotid bruits bilaterally.  Neurological examination:  Orientation: The patient is alert and oriented x3. Cranial nerves: There is good facial symmetry with no significant facial hypomimia. The speech is fluent and clear. Soft palate rises symmetrically and there is no tongue deviation. Hearing is intact to conversational tone. Sensation: Sensation is intact to light touch throughout Motor: Strength is  at least antigravity x4.  Movement examination: Tone: There is nl tone in the bilateral upper extremities.  The tone in the lower extremities is nl.  Abnormal movements: There is no rest tremor in the upper or lower extremities.  There is dyskinetic movements in the left>> right lower extremity.  There are involuntary movements of the tongue.  The tongue stays within the mouth. Coordination:  There is no decremation with rapid alternating movements. Gait and Station: The patient ambulates well in the hall with good armswing.  I have reviewed and interpreted the following labs independently    Chemistry      Component Value Date/Time   NA 139 12/04/2023 1001   K 4.3 12/04/2023 1001   CL 101 12/04/2023 1001   CO2 21 12/04/2023 1001   BUN 31 (H) 12/04/2023 1001   CREATININE 1.31 (H) 12/04/2023 1001      Component Value Date/Time   CALCIUM  9.6 12/04/2023 1001   ALKPHOS 118 12/04/2023 1001   AST 13 12/04/2023 1001   ALT 10 12/04/2023 1001   BILITOT 0.3 12/04/2023 1001       Lab Results  Component Value Date   WBC 7.9 12/04/2023   HGB 12.8 12/04/2023   HCT 41.9 12/04/2023   MCV 93 12/04/2023   PLT 243 12/04/2023    Lab Results  Component Value Date   TSH 1.21 11/13/2023     Total time spent on today's visit was 30 minutes, including both face-to-face time and nonface-to-face time.  Time included that spent on review of records  (prior notes available to me/labs/imaging if pertinent), discussing treatment and goals, answering patient's questions and coordinating care.  Cc:  Alec House, MD

## 2024-02-09 ENCOUNTER — Ambulatory Visit: Payer: MEDICAID | Admitting: Neurology

## 2024-02-09 ENCOUNTER — Encounter: Payer: Self-pay | Admitting: Neurology

## 2024-02-09 VITALS — BP 132/86 | HR 78 | Wt 200.8 lb

## 2024-02-09 DIAGNOSIS — G2401 Drug induced subacute dyskinesia: Secondary | ICD-10-CM

## 2024-02-11 ENCOUNTER — Telehealth: Payer: Self-pay | Admitting: Neurology

## 2024-02-11 ENCOUNTER — Ambulatory Visit (INDEPENDENT_AMBULATORY_CARE_PROVIDER_SITE_OTHER): Payer: MEDICAID | Admitting: Podiatry

## 2024-02-11 ENCOUNTER — Encounter: Payer: Self-pay | Admitting: Podiatry

## 2024-02-11 VITALS — Ht 64.0 in | Wt 230.0 lb

## 2024-02-11 DIAGNOSIS — M7661 Achilles tendinitis, right leg: Secondary | ICD-10-CM

## 2024-02-11 DIAGNOSIS — G5753 Tarsal tunnel syndrome, bilateral lower limbs: Secondary | ICD-10-CM | POA: Diagnosis not present

## 2024-02-11 DIAGNOSIS — M7662 Achilles tendinitis, left leg: Secondary | ICD-10-CM

## 2024-02-11 DIAGNOSIS — M76821 Posterior tibial tendinitis, right leg: Secondary | ICD-10-CM

## 2024-02-11 MED ORDER — TRIAMCINOLONE ACETONIDE 10 MG/ML IJ SUSP
10.0000 mg | Freq: Once | INTRAMUSCULAR | Status: AC
  Administered 2024-02-11: 10 mg

## 2024-02-11 NOTE — Progress Notes (Signed)
 Chief Complaint  Patient presents with   Foot Pain    Bilateral foot pain. Medial L foot is worse w/ pain shooting up through toes.  Diabetic A1c 7.2  No anti coag    HPI: 58 y.o. female presents today following up with bilateral PTTD with flatfoot pain.  She reports doing a bit better for period of time after her last visit back in March but reports the pain has been worsening again for several months.  Left foot is worse than the right.  Reports left foot 10/10 pain when aggravated and right foot 8/10.  In addition to the plantar arch and midfoot pain, pain along the inside of the ankle she also is reporting sensations that extend to the toes.  Unsure of her last A1c but states that her morning blood sugars have been in the 110s to 160s routinely.  Past Medical History:  Diagnosis Date   Allergy     Anemia    Anxiety    Arthritis    back and knees   Asthma    daily and prn inhalers   Atypical ductal hyperplasia of breast 03/2012   right   Bipolar 1 disorder (HCC)    CHF (congestive heart failure) (HCC)    Depression    Diabetes mellitus    diet-controlled   Gastric ulcer    GERD (gastroesophageal reflux disease)    Glaucoma    Gout    Headache(784.0)    migraines   Heart murmur    High cholesterol    Hypertension    under control, has been on med. x 12 yrs.   Sleep apnea    Stroke (HCC)    1995   Mild. no residual problems   Substance abuse (HCC)    crack cocaine:  extensive treatment with [facility] [place] from 2014 to 2018 for this and alcohol  abuse.   TMJ (temporomandibular joint disorder)     Past Surgical History:  Procedure Laterality Date   BREAST EXCISIONAL BIOPSY Right    BREAST LUMPECTOMY WITH NEEDLE LOCALIZATION  04/19/2012   Procedure: BREAST LUMPECTOMY WITH NEEDLE LOCALIZATION;  Surgeon: Deward GORMAN Curvin DOUGLAS, MD;  Location: [facility];  Service: General;  Laterality: Right;   FOOT SURGERY Left 2022   [facility]   KNEE ARTHROPLASTY Left 02/19/2022   Procedure: COMPUTER ASSISTED TOTAL KNEE ARTHROPLASTY;  Surgeon: Fidel Rogue, MD;  Location: [facility];  Service: Orthopedics;  Laterality: Left;   KNEE ARTHROPLASTY Right 12/11/2022   Procedure: RIGHT COMPUTER ASSISTED TOTAL KNEE ARTHROPLASTY;  Surgeon: Fidel Rogue, MD;  Location: [facility];  Service: Orthopedics;  Laterality: Right;   KNEE ARTHROSCOPY W/ PARTIAL MEDIAL MENISCECTOMY  05/01/2010   right    Allergies  Allergen Reactions   Chocolate Hives   Penicillins Hives and Other (See Comments)        Orange Hives   Strawberry Extract Swelling   Tomato Hives and Other (See Comments)    acid foods    ROS denies any nausea, vomiting, fever, chills, chest pain, shortness of breath   Physical Exam: There were no vitals filed for this visit.  General: The patient is alert and oriented x3 in no acute distress.  Dermatology: Skin is warm, dry and supple bilateral lower extremities. Interspaces are clear of maceration and debris.    Vascular: Palpable pedal pulses bilaterally. Capillary refill within normal limits.  No erythema or calor.  Neurological:  Protective sensation decreased.  Positive Tinel's sign bilaterally over tibial nerve left greater than right  Musculoskeletal Exam: Tenderness on palpation of PT tendons bilaterally around medial ankle and at Navicular tuberosity.  Arch collapse on weightbearing noted. Decreased muscle strength to left lower extremity.  Ankle joint dorsiflexion less than 10 degrees with knee extended. Pes planus foot type. Right lower extremity tibial varum  Radiographic Exam: Bilateral foot and ankle radiographs weightbearing 07/30/23 Normal osseous mineralization.  Pes planus foot type noted.  Decreased calcaneal inclination angle.  Talar head uncoverage.  Increased cuboid abduction angle.  Right lower extremity tibial varum noted.  Assessment/Plan of Care: 1. Posterior tibial tendon dysfunction (PTTD)  of both lower extremities   2. Tarsal tunnel syndrome of both lower extremities      Meds ordered this encounter  Medications   triamcinolone  acetonide (KENALOG ) 10 MG/ML injection 10 mg   None  Discussed clinical findings with patient today.  # New problem tarsal tunnel syndrome bilateral Discussed etiology and mechanism behind the flatfoot pain with PTTD and the tarsal tunnel syndrome affecting both sides left greater than right.  Will avoid prolonged NSAIDs due to cardiovascular history.  I recommend that she resume strict use of the ankle braces and good supportive shoes, power step inserts over the next couple weeks.  Patient requesting 2 more sets of the power step inserts.  If there is no improvement, could consider formal PT.  Patient is not a very good surgical candidate.  Also discussed home physical therapy stretching and strengthening regimen for the PT tendon dysfunction and the tarsal tunnel syndrome with written instructions dispensed.  # Bilateral PT tendon dysfunction with flatfoot Verbal consent obtained to administer corticosteroid injection to the bilateral PT tendon just proximal to the level of the navicular insertion.  Alcohol  skin prep.  Left and right PT tendon was each injected with 0.5 cc of 0.5% Marcaine  plain, 0.5 cc of dexamethasone , 0.5 cc of Kenalog  10.  Band-Aid applied.  Patient tolerated well.  Follow-up in 1 month for reevaluation.   Jayko Voorhees L. Lamount MAUL, AACFAS [facility]     [address] [place], [place] [address]                Office [phone]  Fax [phone]

## 2024-02-11 NOTE — Patient Instructions (Signed)
 Good shoe brands for arch support include Brooks, Hoka's, ASICS, new balance.  I recommend strict use of the lace up ASO ankle braces on both feet over the next couple weeks whenever weightbearing is much as possible.  May also benefit from another set of Powersteps or Superfeet inserts which you can get over-the-counter or online

## 2024-02-11 NOTE — Telephone Encounter (Signed)
 Rep calling to let Dr. Evonnie know they were unsuccessful at getting benefits approved due to not being able to speak to Pt. Of Dr. For Rx Austedo, pls contact Pt. To update. Pt needs to call insurance to get benefits for Rx

## 2024-02-12 ENCOUNTER — Ambulatory Visit: Payer: MEDICAID | Admitting: Gastroenterology

## 2024-02-22 NOTE — Telephone Encounter (Signed)
 Pt called [employer] needs PA form completed and sent for Rx Austedo

## 2024-02-22 NOTE — Telephone Encounter (Signed)
 Talked to patient and [employer] is sending us  a PA form to fill out

## 2024-02-23 ENCOUNTER — Telehealth: Payer: Self-pay

## 2024-02-23 ENCOUNTER — Other Ambulatory Visit (HOSPITAL_COMMUNITY): Payer: Self-pay

## 2024-02-23 NOTE — Telephone Encounter (Signed)
 Need copy of either AIMS score or ESRI score - I'm not finding these

## 2024-02-23 NOTE — Telephone Encounter (Signed)
 Sent to PA team.

## 2024-02-23 NOTE — Telephone Encounter (Signed)
 This patient needs a PA for Austedo / XR 36 mg tablet daily Than You

## 2024-02-29 ENCOUNTER — Other Ambulatory Visit: Payer: Self-pay | Admitting: *Deleted

## 2024-03-03 ENCOUNTER — Telehealth: Payer: Self-pay | Admitting: Neurology

## 2024-03-03 NOTE — Telephone Encounter (Signed)
 Caller is with [employer]. They are wanting to notify the office the prior authorization for Austedo 36 mg was closed on 02-28-24. According to the payor it was closed due to the office not answering requested questions. It can be resubmitted onces questions are answered. Thanks

## 2024-03-05 ENCOUNTER — Other Ambulatory Visit: Payer: Self-pay | Admitting: Gastroenterology

## 2024-03-07 ENCOUNTER — Telehealth: Payer: Self-pay | Admitting: Neurology

## 2024-03-07 NOTE — Telephone Encounter (Signed)
 Pt asking if we have rcvd a PA form [employer]

## 2024-03-08 ENCOUNTER — Telehealth: Payer: Self-pay | Admitting: Neurology

## 2024-03-08 NOTE — Telephone Encounter (Signed)
 Calling [employer] and [employer] today to see if I can get this worked out for the patient. She ran out of samples today. Can she get more samples while I work on this mess

## 2024-03-08 NOTE — Telephone Encounter (Signed)
 Pt called in this morning and she stated that she has finished the sample package for the Austedo. Pt is waiting to hear results from the pre authorization for the Austedo. Thanks

## 2024-03-10 ENCOUNTER — Encounter: Payer: Self-pay | Admitting: Podiatry

## 2024-03-10 ENCOUNTER — Ambulatory Visit (INDEPENDENT_AMBULATORY_CARE_PROVIDER_SITE_OTHER): Payer: MEDICAID | Admitting: Podiatry

## 2024-03-10 DIAGNOSIS — G5761 Lesion of plantar nerve, right lower limb: Secondary | ICD-10-CM | POA: Diagnosis not present

## 2024-03-10 DIAGNOSIS — G5753 Tarsal tunnel syndrome, bilateral lower limbs: Secondary | ICD-10-CM | POA: Diagnosis not present

## 2024-03-10 DIAGNOSIS — M76821 Posterior tibial tendinitis, right leg: Secondary | ICD-10-CM | POA: Diagnosis not present

## 2024-03-10 DIAGNOSIS — M76822 Posterior tibial tendinitis, left leg: Secondary | ICD-10-CM | POA: Diagnosis not present

## 2024-03-10 NOTE — Progress Notes (Signed)
 Chief Complaint  Patient presents with   Foot Pain    Bilateral foot pain  Injection helped for a week. Bought and wearing Hoka and wearing brace 4-5 hrs a day not much improvement.  Diabetic A1c 7.2  no anti coag    HPI: 58 y.o. female presents today following up with bilateral PTTD pain and Tarsal tunnel symptoms.  The patient reports that the injections for the PT tendons helped for short period of time.  At this point the pain is overall not improved from when she came in last visit.  She has tried different shoes, braces, PowerStep inserts from exercise program.  Past Medical History:  Diagnosis Date   Allergy     Anemia    Anxiety    Arthritis    back and knees   Asthma    daily and prn inhalers   Atypical ductal hyperplasia of breast 03/2012   right   Bipolar 1 disorder (HCC)    CHF (congestive heart failure) (HCC)    Depression    Diabetes mellitus    diet-controlled   Gastric ulcer    GERD (gastroesophageal reflux disease)    Glaucoma    Gout    Headache(784.0)    migraines   Heart murmur    High cholesterol    Hypertension    under control, has been on med. x 12 yrs.   Sleep apnea    Stroke (HCC)    1995   Mild. no residual problems   Substance abuse (HCC)    crack cocaine:  extensive treatment with [facility] [place] from 2014 to 2018 for this and alcohol  abuse.   TMJ (temporomandibular joint disorder)     Past Surgical History:  Procedure Laterality Date   BREAST EXCISIONAL BIOPSY Right    BREAST LUMPECTOMY WITH NEEDLE LOCALIZATION  04/19/2012   Procedure: BREAST LUMPECTOMY WITH NEEDLE LOCALIZATION;  Surgeon: Deward GORMAN Curvin DOUGLAS, MD;  Location: [facility];  Service: General;  Laterality: Right;   FOOT SURGERY Left 2022   [facility]   KNEE ARTHROPLASTY Left 02/19/2022   Procedure: COMPUTER ASSISTED TOTAL KNEE ARTHROPLASTY;  Surgeon: Fidel Rogue, MD;  Location: [facility];  Service: Orthopedics;   Laterality: Left;   KNEE ARTHROPLASTY Right 12/11/2022   Procedure: RIGHT COMPUTER ASSISTED TOTAL KNEE ARTHROPLASTY;  Surgeon: Fidel Rogue, MD;  Location: [facility];  Service: Orthopedics;  Laterality: Right;   KNEE ARTHROSCOPY W/ PARTIAL MEDIAL MENISCECTOMY  05/01/2010   right    Allergies  Allergen Reactions   Chocolate Hives   Penicillins Hives and Other (See Comments)        Orange Hives   Strawberry Extract Swelling   Tomato Hives and Other (See Comments)    acid foods    ROS denies any nausea, vomiting, fever, chills, chest pain, shortness of breath   Physical Exam: There were no vitals filed for this visit.  General: The patient is alert and oriented x3 in no acute distress.  Dermatology: Skin is warm, dry and supple bilateral lower extremities. Interspaces are clear of maceration and debris.    Vascular: Palpable pedal pulses bilaterally. Capillary refill within normal limits.  No erythema or calor.  Neurological: Protective sensation decreased.  Positive Tinel's sign bilaterally over tibial nerve bilateral.  Musculoskeletal Exam: Tenderness on palpation of PT tendons bilaterally around medial ankle and at Navicular tuberosity.  Arch collapse on weightbearing noted. Decreased muscle strength to left  lower extremity.  Ankle joint dorsiflexion less than 10 degrees with knee extended. Pes planus foot type. Right lower extremity tibial varum  Radiographic Exam: Bilateral foot and ankle radiographs weightbearing 07/30/23 Normal osseous mineralization.  Pes planus foot type noted.  Decreased calcaneal inclination angle.  Talar head uncoverage.  Increased cuboid abduction angle.  Right lower extremity tibial varum noted.  Assessment/Plan of Care: 1. Tarsal tunnel syndrome of both lower extremities   2. Posterior tibial tendon dysfunction (PTTD) of both lower extremities      No orders of the defined types were placed in this encounter.  MR ANKLE LEFT WO CONTRAST MR  ANKLE RIGHT WO CONTRAST  Discussed clinical findings with patient today.  Given her ongoing symptoms and no relief with different shoes, inserts, braces and transient relief from PT tendon injections, recommend left and right ankle MRI to assess for signs of tibial nerve entrapment, tarsal tunnel syndrome as well as any degenerative PT tendon damage. Her symptoms are chronic in nature at this point greater than 6 months.  We did discuss physical therapy, she is not interested in PT at this time.  We did discuss surgical tarsal tunnel decompression but explained that MRI would needed before discussing definitive plan.  Would need medical clearance due to cardiovascular history including history of stroke, CHF, diabetes type 2.  Will see patient back in about 4 to 6 weeks to review results   Tyauna Lacaze L. Lamount MAUL, AACFAS [facility]     [address] [place], [place] [address]                Office [phone]  Fax [phone]

## 2024-03-11 ENCOUNTER — Encounter: Payer: Self-pay | Admitting: Podiatry

## 2024-03-14 ENCOUNTER — Ambulatory Visit (INDEPENDENT_AMBULATORY_CARE_PROVIDER_SITE_OTHER): Payer: MEDICAID | Admitting: Podiatry

## 2024-03-14 ENCOUNTER — Encounter: Payer: Self-pay | Admitting: Podiatry

## 2024-03-14 DIAGNOSIS — M79675 Pain in left toe(s): Secondary | ICD-10-CM | POA: Diagnosis not present

## 2024-03-14 DIAGNOSIS — E1142 Type 2 diabetes mellitus with diabetic polyneuropathy: Secondary | ICD-10-CM

## 2024-03-14 DIAGNOSIS — B351 Tinea unguium: Secondary | ICD-10-CM | POA: Diagnosis not present

## 2024-03-14 DIAGNOSIS — M79674 Pain in right toe(s): Secondary | ICD-10-CM

## 2024-03-14 NOTE — Progress Notes (Signed)
 This patient returns to my office for at risk foot care.  This patient requires this care by a professional since this patient will be at risk due to having diabetes.    This patient is unable to cut nails herself since the patient cannot reach her nails.These nails are painful walking and wearing shoes.  This patient presents for at risk foot care today.  General Appearance  Alert, conversant and in no acute stress.  Vascular  Dorsalis pedis and posterior tibial  pulses are palpable  bilaterally.  Capillary return is within normal limits  bilaterally. Temperature is within normal limits  bilaterally.  Neurologic  Senn-Weinstein monofilament wire test within normal limits  bilaterally. Muscle power within normal limits bilaterally.  Nails Thick disfigured discolored nails with subungual debris  from hallux to fifth toes bilaterally. No evidence of bacterial infection or drainage bilaterally.  Orthopedic  No limitations of motion  feet .  No crepitus or effusions noted.  No bony pathology or digital deformities noted.  Skin  normotropic skin with no porokeratosis noted bilaterally.  No signs of infections or ulcers noted.     Onychomycosis  Pain in right toes  Pain in left toes  Consent was obtained for treatment procedures.   Mechanical debridement of nails 1-5  bilaterally performed with a nail nipper.  Filed with dremel without incident.    Return office visit    3 months                  Told patient to return for periodic foot care and evaluation due to potential at risk complications.   Helane Gunther DPM

## 2024-03-14 NOTE — Telephone Encounter (Signed)
 Called [employer] explained that the AIMS needs to be completed before we can begin the auth process .

## 2024-03-14 NOTE — Telephone Encounter (Signed)
 Received message from the after hours service on 03/11/24. Danielle from [employer] called stating the RX Austedo requires a prior authorization. Caller would like to know if the provider plan on pursuing the prior auth because they haven't heard a response. Caller states the last cover my med submitted [id number]. Caller states they have been submitting the request to fax number [phone].

## 2024-03-15 NOTE — Telephone Encounter (Signed)
 Called pateint and let her know that we will do an Aims at the next appointment on the 2nd and send off

## 2024-03-15 NOTE — Progress Notes (Unsigned)
 Assessment/Plan:   1.  Parkinsonism             -resolved off of abilify  injections  2.  Tardive dyskinesia             - Previously, I did not see this but certainly did today, off of ingrezza .  Likely now evident since ingrezza  has been stopped.  Ingrezza  probably d/c because of the parkinsonism.  Parkinsonism was likely from the Abilify , but Ingrezza  can contribute to that.  She states that she was told that the movements in her legs were possibly restless leg, and I assured her that was not the case.  She has no feeling that she needs or wants to move the legs and the lower leg movements are involuntary.  She also has mouth/tongue movements.  We discussed the nature and pathophysiology of tardive dyskinesia and that it can be permanent after exposure to antipsychotic medications.  She did not want to restart the Ingrezza .  We have been working on approval for Austedo XR.  Insurance company required aims score which was done today.    Subjective:   Maureen Swanson was seen today in follow up for parkinsonism.  When I saw her last, we decided to try Austedo, but her insurance company required that an AIMS score be completed.  We tried to bring her back in before today's visit, but she could not get here because she relies on transportation services.    ALLERGIES:   Allergies  Allergen Reactions   Chocolate Hives   Penicillins Hives and Other (See Comments)        Orange Hives   Strawberry Extract Swelling   Tomato Hives and Other (See Comments)    acid foods    CURRENT MEDICATIONS:  No outpatient medications have been marked as taking for the 03/17/24 encounter (Appointment) with Linnette Panella, Asberry RAMAN, DO.     Objective:   PHYSICAL EXAMINATION:    VITALS:   There were no vitals filed for this visit.   GEN:  The patient appears stated age and is in NAD. HEENT:  Normocephalic, atraumatic.  The mucous membranes are moist. The superficial temporal arteries are without ropiness or  tenderness. CV:  RRR Lungs:  CTAB Neck/HEME:  There are no carotid bruits bilaterally.  Neurological examination:  Orientation: The patient is alert and oriented x3. Cranial nerves: There is good facial symmetry with no significant facial hypomimia. The speech is fluent and clear. Soft palate rises symmetrically and there is no tongue deviation. Hearing is intact to conversational tone. Sensation: Sensation is intact to light touch throughout Motor: Strength is at least antigravity x4.  Movement examination: Tone: There is nl tone in the bilateral upper extremities.  The tone in the lower extremities is nl.  Abnormal movements: There is no rest tremor in the upper or lower extremities.  There is dyskinetic movements in the left>> right lower extremity.  There are involuntary movements of the tongue.  The tongue stays within the mouth. Coordination:  There is no decremation with rapid alternating movements. Gait and Station: The patient ambulates well in the hall with good armswing.  I have reviewed and interpreted the following labs independently    Chemistry      Component Value Date/Time   NA 139 12/04/2023 1001   K 4.3 12/04/2023 1001   CL 101 12/04/2023 1001   CO2 21 12/04/2023 1001   BUN 31 (H) 12/04/2023 1001   CREATININE 1.31 (H) 12/04/2023 1001  Component Value Date/Time   CALCIUM  9.6 12/04/2023 1001   ALKPHOS 118 12/04/2023 1001   AST 13 12/04/2023 1001   ALT 10 12/04/2023 1001   BILITOT 0.3 12/04/2023 1001       Lab Results  Component Value Date   WBC 7.9 12/04/2023   HGB 12.8 12/04/2023   HCT 41.9 12/04/2023   MCV 93 12/04/2023   PLT 243 12/04/2023    Lab Results  Component Value Date   TSH 1.21 11/13/2023     Total time spent on today's visit was *** minutes, including both face-to-face time and nonface-to-face time.  Time included that spent on review of records (prior notes available to me/labs/imaging if pertinent), discussing treatment and  goals, answering patient's questions and coordinating care.  Cc:  Alec House, MD

## 2024-03-15 NOTE — Telephone Encounter (Signed)
 Left a message with the after hour service on  03-14-24   Caller wants to check the status of the Prior auth on her medication please call

## 2024-03-17 ENCOUNTER — Encounter: Payer: Self-pay | Admitting: Neurology

## 2024-03-17 ENCOUNTER — Ambulatory Visit: Payer: MEDICAID | Admitting: Neurology

## 2024-03-17 ENCOUNTER — Other Ambulatory Visit (HOSPITAL_COMMUNITY): Payer: Self-pay

## 2024-03-17 ENCOUNTER — Telehealth: Payer: Self-pay | Admitting: Pharmacy Technician

## 2024-03-17 VITALS — BP 118/80 | HR 78 | Wt 243.0 lb

## 2024-03-17 DIAGNOSIS — F319 Bipolar disorder, unspecified: Secondary | ICD-10-CM

## 2024-03-17 DIAGNOSIS — G2401 Drug induced subacute dyskinesia: Secondary | ICD-10-CM | POA: Diagnosis not present

## 2024-03-17 MED ORDER — AUSTEDO XR PATIENT TITRATION 6 & 12 & 24 MG PO TEPK: 6.0000 mg | EXTENDED_RELEASE_TABLET | Freq: Every day | ORAL | Status: AC

## 2024-03-17 NOTE — Telephone Encounter (Signed)
 Pharmacy Patient Advocate Encounter   Received notification from Pt Calls Messages that prior authorization for AUSTEDO XR 36MG  is required/requested.   Insurance verification completed.   The patient is insured through [facility] MEDICAID.   Per test claim: PA required; PA started via CoverMyMeds. KEY [id number] . Please see clinical question(s) below that I am not finding the answer to in their chart and advise.

## 2024-03-17 NOTE — Telephone Encounter (Signed)
 Hi there, Can you provide this information listed below.

## 2024-03-17 NOTE — Telephone Encounter (Signed)
 Aims has been completed for this patient and I am faxing over

## 2024-03-17 NOTE — Telephone Encounter (Signed)
 Pharmacy Patient Advocate Encounter  Received notification from [facility] MEDICAID that Prior Authorization for AUSTEDO XR 36MG  has been APPROVED from 10.2.25 to 4.2.26. Ran test claim, Copay is $4. This test claim was processed through [facility]- copay amounts may vary at other pharmacies due to pharmacy/plan contracts, or as the patient moves through the different stages of their insurance plan.   PA #/Case ID/Reference #: [id number]

## 2024-03-17 NOTE — Telephone Encounter (Signed)
 Thank you. PA has been submitted, and telephone encounter has been created. Please see telephone encounter dated 10.2.25.

## 2024-03-18 ENCOUNTER — Other Ambulatory Visit: Payer: Self-pay

## 2024-03-21 ENCOUNTER — Other Ambulatory Visit: Payer: Self-pay | Admitting: *Deleted

## 2024-03-21 MED ORDER — LEVOCETIRIZINE DIHYDROCHLORIDE 5 MG PO TABS: 5.0000 mg | ORAL_TABLET | Freq: Every evening | ORAL | 4 refills | Status: AC

## 2024-03-24 ENCOUNTER — Ambulatory Visit
Admission: RE | Admit: 2024-03-24 | Discharge: 2024-03-24 | Disposition: A | Payer: MEDICAID | Source: Ambulatory Visit | Attending: Podiatry

## 2024-03-24 ENCOUNTER — Ambulatory Visit
Admission: RE | Admit: 2024-03-24 | Discharge: 2024-03-24 | Disposition: A | Discharge status: Home / Self Care | Payer: MEDICAID | Source: Ambulatory Visit | Attending: Podiatry | Admitting: Podiatry

## 2024-03-24 DIAGNOSIS — G5753 Tarsal tunnel syndrome, bilateral lower limbs: Secondary | ICD-10-CM

## 2024-04-08 ENCOUNTER — Ambulatory Visit: Payer: MEDICAID | Admitting: Podiatry

## 2024-04-08 DIAGNOSIS — M76821 Posterior tibial tendinitis, right leg: Secondary | ICD-10-CM | POA: Diagnosis not present

## 2024-04-08 DIAGNOSIS — M76822 Posterior tibial tendinitis, left leg: Secondary | ICD-10-CM | POA: Diagnosis not present

## 2024-04-08 DIAGNOSIS — G5753 Tarsal tunnel syndrome, bilateral lower limbs: Secondary | ICD-10-CM

## 2024-04-08 NOTE — Patient Instructions (Signed)
 I have ordered a nerve conduction test for you. If you do not hear for them about scheduling within the next 1 week, or you have any questions please give us  a call at [phone].

## 2024-04-08 NOTE — Progress Notes (Signed)
 Subjective:  Patient ID: Maureen Swanson, female    DOB: 11-22-1965,  MRN: [id number]  Chief Complaint  Patient presents with   tarsal tunnel    MRI results.  Details and next procedure.  Diabetic A1c 7.2.  No anti coag     Discussed the use of AI scribe software for clinical note transcription with the patient, who gave verbal consent to proceed.  History of Present Illness Maureen Swanson is a 59 year old female with tarsal tunnel syndrome and PTTD who presents with persistent bilateral foot pain.  She experiences persistent bilateral foot pain associated with tarsal tunnel syndrome. Despite trials using braces, shoe inserts, and different footwear, her symptoms have not improved and sometimes worsen. She continues to have flat foot pain. She does come in wearing slippers.  She has similar symptoms bilaterally, left greater than right.  Does have history of type 2 diabetes, CHF, history of stroke, history of falls, and physical therapy for separate issue, does report history of low back pain.  She states that she does live alone without assistance.      Objective:    Physical Exam MUSCULOSKELETAL: No pain with single leg raise bilaterally. Positive Tinel sign on the tibial nerve left and right side. Pain along the bilateral posterior tibial tendon insertion. Pain on palpation of left Achilles. Pain on eversion of the foot bilaterally.  Does report some referred pain to the big toe with dorsiflexion of the foot. Palpable pedal pulses.  Cap refill intact to the digits.  Some baseline edema present.  Pes planus foot type.  Protective sensation decreased.   No images are attached to the encounter.    Results RADIOLOGY Left Ankle MRI: IMPRESSION: 1. Findings suspicious for partial-thickness longitudinal split tear of the peroneal brevis tendon distal to the level of the lateral malleolus. No peroneal tenosynovitis. 2. Trace tenosynovitis of the posterior tibial tendon at  the retromalleolar level. 3. Thickening of the superomedial portion of the spring ligament. 4. Calcaneal enthesopathy at the insertion of the Achilles tendon.  Right Ankle MRI: IMPRESSION: 1. Tendinosis of the posterior tibial tendon with trace tenosynovitis above the level of the medial malleolus. Similar thickening of the superomedial portion of the spring ligament is again noted. 2. 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 3. 8 mm cystic structure adjacent to the plantar margin of the lateral cuneiform likely reflects a ganglion cyst.   Assessment:   1. Tarsal tunnel syndrome of both lower extremities   2. Posterior tibial tendon dysfunction (PTTD) of both lower extremities      Plan:  Patient was evaluated and treated and all questions answered.  Assessment and Plan Assessment & Plan Bilateral tarsal tunnel syndrome with associated posterior tibial tendon dysfunction and flat foot deformity Chronic bilateral tarsal tunnel syndrome with posterior tibial tendon dysfunction and flat foot deformity. Symptoms persist despite conservative treatments. Positive Tinel's sign bilaterally. MRI shows inflammation around PT tendon. Concerns about surgery due to chronic low back pain, lack of home support, and history of falls. Potential contribution of back issues to nerve pain in feet. - Reviewed MRI results with her. - Consider nerve conduction test to evaluate potential contribution of back issues. - Encouraged use of supportive shoes and inserts with gradual increase in wear time.  Left Achilles tendinitis Left Achilles tendinitis with pain on palpation and movement. MRI indicates low-grade tendinitis. Conservative management preferred due to surgical concerns. - Continue conservative  management with supportive footwear.    Return in about 4 weeks (around 05/06/2024) for Tarsal tunnel.  Instructed patient to  reschedule if Nerve conduction test has not been obtained.

## 2024-04-12 ENCOUNTER — Encounter: Payer: Self-pay | Admitting: Podiatry

## 2024-04-18 ENCOUNTER — Telehealth: Payer: Self-pay | Admitting: Lab

## 2024-04-18 ENCOUNTER — Telehealth: Payer: Self-pay | Admitting: Podiatry

## 2024-04-18 NOTE — Telephone Encounter (Signed)
 Checking on status of referral for MRI.

## 2024-04-18 NOTE — Telephone Encounter (Signed)
 Patient is calling about MRI order wants to know what is next.

## 2024-04-19 ENCOUNTER — Encounter: Payer: Self-pay | Admitting: Lab

## 2024-04-19 ENCOUNTER — Encounter: Payer: Self-pay | Admitting: Neurology

## 2024-04-21 ENCOUNTER — Other Ambulatory Visit: Payer: Self-pay | Admitting: Podiatry

## 2024-04-21 DIAGNOSIS — G5753 Tarsal tunnel syndrome, bilateral lower limbs: Secondary | ICD-10-CM

## 2024-04-21 NOTE — Progress Notes (Signed)
 Placing referral for Nerve conduction velocity testing to differentiate tarsal tunnel syndrome vs lumbar spine pathology.

## 2024-04-22 ENCOUNTER — Encounter: Payer: Self-pay | Admitting: Lab

## 2024-05-06 ENCOUNTER — Ambulatory Visit: Payer: MEDICAID | Admitting: Podiatry

## 2024-05-30 ENCOUNTER — Other Ambulatory Visit: Payer: Self-pay

## 2024-05-30 DIAGNOSIS — R202 Paresthesia of skin: Secondary | ICD-10-CM

## 2024-06-02 ENCOUNTER — Ambulatory Visit: Payer: MEDICAID | Admitting: Podiatry

## 2024-06-03 ENCOUNTER — Encounter: Payer: MEDICAID | Admitting: Neurology

## 2024-06-03 ENCOUNTER — Ambulatory Visit: Payer: MEDICAID | Admitting: Neurology

## 2024-06-08 ENCOUNTER — Ambulatory Visit: Payer: MEDICAID

## 2024-06-13 ENCOUNTER — Encounter: Payer: Self-pay | Admitting: Podiatry

## 2024-06-13 ENCOUNTER — Ambulatory Visit (INDEPENDENT_AMBULATORY_CARE_PROVIDER_SITE_OTHER): Payer: MEDICAID | Admitting: Podiatry

## 2024-06-13 VITALS — Ht 64.0 in | Wt 243.0 lb

## 2024-06-13 DIAGNOSIS — M79674 Pain in right toe(s): Secondary | ICD-10-CM | POA: Diagnosis not present

## 2024-06-13 DIAGNOSIS — M79675 Pain in left toe(s): Secondary | ICD-10-CM | POA: Diagnosis not present

## 2024-06-13 DIAGNOSIS — B351 Tinea unguium: Secondary | ICD-10-CM | POA: Diagnosis not present

## 2024-06-13 DIAGNOSIS — E1142 Type 2 diabetes mellitus with diabetic polyneuropathy: Secondary | ICD-10-CM

## 2024-06-13 NOTE — Progress Notes (Signed)
 This patient returns to my office for at risk foot care.  This patient requires this care by a professional since this patient will be at risk due to having diabetes.    This patient is unable to cut nails herself since the patient cannot reach her nails.These nails are painful walking and wearing shoes.  This patient presents for at risk foot care today.  General Appearance  Alert, conversant and in no acute stress.  Vascular  Dorsalis pedis and posterior tibial  pulses are palpable  bilaterally.  Capillary return is within normal limits  bilaterally. Temperature is within normal limits  bilaterally.  Neurologic  Senn-Weinstein monofilament wire test within normal limits  bilaterally. Muscle power within normal limits bilaterally.  Nails Thick disfigured discolored nails with subungual debris  from hallux to fifth toes bilaterally. No evidence of bacterial infection or drainage bilaterally.  Orthopedic  No limitations of motion  feet .  No crepitus or effusions noted.  No bony pathology or digital deformities noted.  Skin  normotropic skin with no porokeratosis noted bilaterally.  No signs of infections or ulcers noted.     Onychomycosis  Pain in right toes  Pain in left toes  Consent was obtained for treatment procedures.   Mechanical debridement of nails 1-5  bilaterally performed with a nail nipper.  Filed with dremel without incident.    Return office visit    3 months                  Told patient to return for periodic foot care and evaluation due to potential at risk complications.   Helane Gunther DPM

## 2024-06-23 ENCOUNTER — Ambulatory Visit: Payer: MEDICAID | Admitting: Podiatry

## 2024-06-24 ENCOUNTER — Ambulatory Visit (INDEPENDENT_AMBULATORY_CARE_PROVIDER_SITE_OTHER): Payer: MEDICAID | Admitting: Podiatry

## 2024-06-24 ENCOUNTER — Encounter: Payer: Self-pay | Admitting: Podiatry

## 2024-06-24 DIAGNOSIS — S86311D Strain of muscle(s) and tendon(s) of peroneal muscle group at lower leg level, right leg, subsequent encounter: Secondary | ICD-10-CM | POA: Diagnosis not present

## 2024-06-24 DIAGNOSIS — M6788 Other specified disorders of synovium and tendon, other site: Secondary | ICD-10-CM

## 2024-06-24 DIAGNOSIS — M76822 Posterior tibial tendinitis, left leg: Secondary | ICD-10-CM

## 2024-06-24 DIAGNOSIS — M76821 Posterior tibial tendinitis, right leg: Secondary | ICD-10-CM | POA: Diagnosis not present

## 2024-06-24 DIAGNOSIS — G5753 Tarsal tunnel syndrome, bilateral lower limbs: Secondary | ICD-10-CM | POA: Diagnosis not present

## 2024-06-24 NOTE — Progress Notes (Signed)
 "  Subjective:  Patient ID: Maureen Swanson, female    DOB: May 20, 1966,  MRN: [id number]  Chief Complaint  Patient presents with   Foot Pain    Bilateral pain and burning through ankle down toes.  Pain has been worse. Hurts to walk  Diabetic.       Discussed the use of AI scribe software for clinical note transcription with the patient, who gave verbal consent to proceed.  History of Present Illness Maureen Swanson is a 59 year old female with bilateral tarsal tunnel syndrome, acquired flat foot, and tendon pathology who presents for follow-up of persistent bilateral ankle and foot pain.  She has chronic bilateral ankle and foot pain, mainly along the lateral ankles and tarsal tunnel regions, with neuropathic and tendinous features. Pain remains persistent without meaningful change. The lateral ankle pain has worsened at this point.  She intermittently uses over the counter inserts. Has tried ASO ankle braces some time with limited relief. She has not tried custom braces or diabetic shoes.  She underwent lumbar spinal fusion one month ago with improvement in back pain but no change in her foot or ankle pain.  She has type 2 diabetes on Mounjaro and Lantus . Two weeks ago she had hypoglycemic episodes with blood glucose of 33 and 44, which resolved after her PCP decreased her Lantus  dose. Her blood glucose is now stable. She has not had A1c or recent lab monitoring since 2024 after changing PCPs. PMH also includes CHF, history of stroke, history of falls.      Objective:    Physical Exam Palpable DP and PT pulses.  Cap refill intact in the toes.  Baseline edema present. Protective sensation decreased. Pedal skin well-hydrated.  No open skin lesions. Pes planus foot type noted bilaterally with pain over PT tendons at insertion and around ankles.  Positive Tinel's sign bilaterally.  Area of maximal tenderness over lateral ankles along the peroneal tendons.  Weakness with eversion noted  bilaterally.   No images are attached to the encounter.    Results RADIOLOGY Left Ankle MRI: IMPRESSION: 1. Findings suspicious for partial-thickness longitudinal split tear of the peroneal brevis tendon distal to the level of the lateral malleolus. No peroneal tenosynovitis. 2. Trace tenosynovitis of the posterior tibial tendon at the retromalleolar level. 3. Thickening of the superomedial portion of the spring ligament. 4. Calcaneal enthesopathy at the insertion of the Achilles tendon.   Right Ankle MRI: IMPRESSION: 1. Tendinosis of the posterior tibial tendon with trace tenosynovitis above the level of the medial malleolus. Similar thickening of the superomedial portion of the spring ligament is again noted. 2. 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 3. 8 mm cystic structure adjacent to the plantar margin of the lateral cuneiform likely reflects a ganglion cyst.   Assessment:   1. Posterior tibial tendon dysfunction (PTTD) of both lower extremities   2. Tarsal tunnel syndrome of both lower extremities   3. Right peroneal tendonosis   4. Peroneal tendon tear, right, subsequent encounter      Plan:  Patient was evaluated and treated and all questions answered.  Assessment and Plan Assessment & Plan Bilateral tarsal tunnel syndrome with tendon pathology Chronic bilateral tarsal tunnel syndrome with tendon pathology causing medial and lateral ankle and foot pain. Conservative management preferred due to surgical recovery concerns and comorbidities. - Referred to [facility] for custom brace fitting to improve support and restrict hindfoot motion. -  Discussed use of custom-molded braces for medial and lateral ankle pain.  Believe patient would benefit from bilateral [place]  braces. - Recommended continued use of supportive footwear, specifically roomier sneakers or diabetic shoes to accommodate  braces. - Provided written recommendations for extra depth and supportive shoe brands ([employer], [employer], [employer]) to accommodate bracing. - Advised against flats or church shoes due to inadequate support.  Acquired flat foot Chronic acquired flat foot contributing to foot and ankle pain, exacerbating tarsal tunnel syndrome and lateral ankle pain. - Recommended custom bracing to support the arch and ankle, as part of the referral to [facility]. - Advised use of extra depth or diabetic shoes to accommodate bracing and support pes planus. - Provided guidance on appropriate footwear brands and styles to optimize support and comfort.   Follow-up as needed.  Encouraged patient to follow-up once she obtains her braces to see if this provides any benefit.      Return if symptoms worsen or fail to improve.    "

## 2024-06-28 ENCOUNTER — Other Ambulatory Visit: Payer: Self-pay | Admitting: Gastroenterology

## 2024-06-28 ENCOUNTER — Other Ambulatory Visit: Payer: Self-pay | Admitting: Internal Medicine

## 2024-06-28 DIAGNOSIS — E118 Type 2 diabetes mellitus with unspecified complications: Secondary | ICD-10-CM

## 2024-06-30 ENCOUNTER — Encounter: Payer: MEDICAID | Admitting: Neurology

## 2024-08-01 ENCOUNTER — Ambulatory Visit: Payer: MEDICAID | Admitting: Neurology

## 2024-08-12 ENCOUNTER — Encounter: Payer: MEDICAID | Admitting: Neurology

## 2024-08-25 ENCOUNTER — Encounter: Payer: MEDICAID | Admitting: Neurology

## 2024-09-12 ENCOUNTER — Ambulatory Visit: Payer: MEDICAID | Admitting: Podiatry

## 2024-10-06 ENCOUNTER — Ambulatory Visit: Payer: MEDICAID | Admitting: Neurology
